# Patient Record
Sex: Female | Born: 1950
Health system: Southern US, Community
[De-identification: ages and names within clinical notes are randomized; demographics above are authoritative.]

## PROBLEM LIST (undated history)

## (undated) DIAGNOSIS — I10 Essential (primary) hypertension: Secondary | ICD-10-CM

## (undated) DIAGNOSIS — K5730 Diverticulosis of large intestine without perforation or abscess without bleeding: Secondary | ICD-10-CM

## (undated) DIAGNOSIS — I4891 Unspecified atrial fibrillation: Secondary | ICD-10-CM

## (undated) DIAGNOSIS — I5022 Chronic systolic (congestive) heart failure: Secondary | ICD-10-CM

## (undated) DIAGNOSIS — Z7901 Long term (current) use of anticoagulants: Secondary | ICD-10-CM

## (undated) DIAGNOSIS — Z8719 Personal history of other diseases of the digestive system: Secondary | ICD-10-CM

## (undated) DIAGNOSIS — I7300 Raynaud's syndrome without gangrene: Secondary | ICD-10-CM

## (undated) DIAGNOSIS — M1990 Unspecified osteoarthritis, unspecified site: Secondary | ICD-10-CM

## (undated) DIAGNOSIS — R609 Edema, unspecified: Secondary | ICD-10-CM

## (undated) DIAGNOSIS — N183 Chronic kidney disease, stage 3 unspecified: Secondary | ICD-10-CM

## (undated) DIAGNOSIS — I2510 Atherosclerotic heart disease of native coronary artery without angina pectoris: Secondary | ICD-10-CM

## (undated) DIAGNOSIS — I428 Other cardiomyopathies: Secondary | ICD-10-CM

## (undated) DIAGNOSIS — E785 Hyperlipidemia, unspecified: Secondary | ICD-10-CM

## (undated) DIAGNOSIS — N189 Chronic kidney disease, unspecified: Secondary | ICD-10-CM

## (undated) DIAGNOSIS — I959 Hypotension, unspecified: Secondary | ICD-10-CM

## (undated) DIAGNOSIS — N179 Acute kidney failure, unspecified: Secondary | ICD-10-CM

## (undated) DIAGNOSIS — E041 Nontoxic single thyroid nodule: Secondary | ICD-10-CM

## (undated) DIAGNOSIS — Z960 Presence of urogenital implants: Secondary | ICD-10-CM

## (undated) DIAGNOSIS — N959 Unspecified menopausal and perimenopausal disorder: Secondary | ICD-10-CM

## (undated) DIAGNOSIS — R600 Localized edema: Secondary | ICD-10-CM

## (undated) DIAGNOSIS — I480 Paroxysmal atrial fibrillation: Secondary | ICD-10-CM

## (undated) DIAGNOSIS — I509 Heart failure, unspecified: Secondary | ICD-10-CM

## (undated) DIAGNOSIS — Z87442 Personal history of urinary calculi: Secondary | ICD-10-CM

## (undated) DIAGNOSIS — I63442 Cerebral infarction due to embolism of left cerebellar artery: Secondary | ICD-10-CM

## (undated) DIAGNOSIS — K5731 Diverticulosis of large intestine without perforation or abscess with bleeding: Secondary | ICD-10-CM

## (undated) DIAGNOSIS — G4710 Hypersomnia, unspecified: Secondary | ICD-10-CM

## (undated) DIAGNOSIS — R300 Dysuria: Secondary | ICD-10-CM

## (undated) DIAGNOSIS — E559 Vitamin D deficiency, unspecified: Secondary | ICD-10-CM

## (undated) DIAGNOSIS — D62 Acute posthemorrhagic anemia: Secondary | ICD-10-CM

## (undated) DIAGNOSIS — J9602 Acute respiratory failure with hypercapnia: Secondary | ICD-10-CM

## (undated) DIAGNOSIS — R0600 Dyspnea, unspecified: Secondary | ICD-10-CM

## (undated) DIAGNOSIS — D689 Coagulation defect, unspecified: Secondary | ICD-10-CM

## (undated) DIAGNOSIS — I639 Cerebral infarction, unspecified: Secondary | ICD-10-CM

## (undated) DIAGNOSIS — R29818 Other symptoms and signs involving the nervous system: Secondary | ICD-10-CM

## (undated) DIAGNOSIS — I482 Chronic atrial fibrillation, unspecified: Secondary | ICD-10-CM

## (undated) DIAGNOSIS — R001 Bradycardia, unspecified: Secondary | ICD-10-CM

## (undated) HISTORY — DX: Paroxysmal atrial fibrillation: I48.0

## (undated) HISTORY — DX: Diverticulosis of large intestine without perforation or abscess with bleeding: K57.31

## (undated) HISTORY — DX: Hypersomnia, unspecified: G47.10

## (undated) HISTORY — DX: Edema, unspecified: R60.9

## (undated) HISTORY — DX: Coagulation defect, unspecified: D68.9

## (undated) HISTORY — PX: CHOLECYSTECTOMY: SHX55

## (undated) HISTORY — DX: Hyperlipidemia, unspecified: E78.5

## (undated) HISTORY — DX: Diverticulosis of large intestine without perforation or abscess without bleeding: K57.30

## (undated) HISTORY — DX: Acute respiratory failure with hypercapnia: J96.02

## (undated) HISTORY — DX: Hypotension, unspecified: I95.9

## (undated) HISTORY — DX: Personal history of other diseases of the digestive system: Z87.19

## (undated) HISTORY — DX: Cerebral infarction due to embolism of left cerebellar artery: I63.442

## (undated) HISTORY — DX: Bradycardia, unspecified: R00.1

## (undated) HISTORY — DX: Nontoxic single thyroid nodule: E04.1

## (undated) HISTORY — DX: Essential (primary) hypertension: I10

## (undated) HISTORY — PX: COLONOSCOPY: SHX174

## (undated) HISTORY — DX: Other symptoms and signs involving the nervous system: R29.818

## (undated) HISTORY — PX: PARTIAL HYSTERECTOMY: SHX80

## (undated) HISTORY — DX: Unspecified menopausal and perimenopausal disorder: N95.9

## (undated) HISTORY — DX: Dysuria: R30.0

## (undated) HISTORY — DX: Acute posthemorrhagic anemia: D62

## (undated) HISTORY — DX: Localized edema: R60.0

## (undated) HISTORY — DX: Vitamin D deficiency, unspecified: E55.9

## (undated) HISTORY — DX: Morbid (severe) obesity due to excess calories: E66.01

## (undated) HISTORY — DX: Chronic systolic (congestive) heart failure: I50.22

## (undated) HISTORY — DX: Acute kidney failure, unspecified: N17.9

## (undated) HISTORY — DX: Long term (current) use of anticoagulants: Z79.01

## (undated) HISTORY — DX: Chronic kidney disease, unspecified: N18.9

## (undated) HISTORY — DX: Unspecified osteoarthritis, unspecified site: M19.90

## (undated) HISTORY — DX: Chronic atrial fibrillation, unspecified: I48.20

## (undated) HISTORY — DX: Raynaud's syndrome without gangrene: I73.00

---

## 1996-01-18 ENCOUNTER — Encounter: Payer: Self-pay | Admitting: Internal Medicine

## 1996-01-18 LAB — CONVERTED CEMR LAB

## 2000-05-22 ENCOUNTER — Encounter (INDEPENDENT_AMBULATORY_CARE_PROVIDER_SITE_OTHER): Payer: Self-pay | Admitting: Specialist

## 2000-05-22 ENCOUNTER — Other Ambulatory Visit: Admission: RE | Admit: 2000-05-22 | Discharge: 2000-05-22 | Payer: Self-pay | Admitting: Gastroenterology

## 2000-05-22 LAB — HM COLONOSCOPY

## 2001-12-01 ENCOUNTER — Other Ambulatory Visit: Admission: RE | Admit: 2001-12-01 | Discharge: 2001-12-01 | Payer: Self-pay | Admitting: Obstetrics and Gynecology

## 2002-12-27 ENCOUNTER — Other Ambulatory Visit: Admission: RE | Admit: 2002-12-27 | Discharge: 2002-12-27 | Payer: Self-pay | Admitting: Obstetrics and Gynecology

## 2004-06-25 ENCOUNTER — Other Ambulatory Visit: Admission: RE | Admit: 2004-06-25 | Discharge: 2004-06-25 | Payer: Self-pay | Admitting: Obstetrics and Gynecology

## 2004-12-24 ENCOUNTER — Ambulatory Visit: Payer: Self-pay | Admitting: Internal Medicine

## 2005-08-06 ENCOUNTER — Other Ambulatory Visit: Admission: RE | Admit: 2005-08-06 | Discharge: 2005-08-06 | Payer: Self-pay | Admitting: Obstetrics and Gynecology

## 2006-01-12 ENCOUNTER — Ambulatory Visit: Payer: Self-pay | Admitting: Internal Medicine

## 2006-08-03 ENCOUNTER — Ambulatory Visit: Payer: Self-pay | Admitting: Internal Medicine

## 2006-08-14 ENCOUNTER — Ambulatory Visit (HOSPITAL_COMMUNITY): Admission: RE | Admit: 2006-08-14 | Discharge: 2006-08-14 | Payer: Self-pay | Admitting: Internal Medicine

## 2007-01-07 ENCOUNTER — Ambulatory Visit: Payer: Self-pay | Admitting: Internal Medicine

## 2007-01-07 LAB — CONVERTED CEMR LAB
ALT: 23 units/L (ref 0–40)
AST: 24 units/L (ref 0–37)
Alkaline Phosphatase: 64 units/L (ref 39–117)
BUN: 14 mg/dL (ref 6–23)
Basophils Relative: 0.5 % (ref 0.0–1.0)
Bilirubin Urine: NEGATIVE
CO2: 34 meq/L — ABNORMAL HIGH (ref 19–32)
Calcium: 9.6 mg/dL (ref 8.4–10.5)
Creatinine, Ser: 0.8 mg/dL (ref 0.4–1.2)
Crystals: NEGATIVE
Eosinophils Absolute: 0.1 10*3/uL (ref 0.0–0.6)
Eosinophils Relative: 1.7 % (ref 0.0–5.0)
HCT: 42.2 % (ref 36.0–46.0)
LDL Cholesterol: 111 mg/dL — ABNORMAL HIGH (ref 0–99)
MCHC: 33.8 g/dL (ref 30.0–36.0)
MCV: 89.3 fL (ref 78.0–100.0)
Neutro Abs: 4.2 10*3/uL (ref 1.4–7.7)
Neutrophils Relative %: 66 % (ref 43.0–77.0)
RBC / HPF: NONE SEEN
RDW: 12.6 % (ref 11.5–14.6)
Sodium: 140 meq/L (ref 135–145)
Specific Gravity, Urine: 1.025 (ref 1.000–1.03)
TSH: 0.76 microintl units/mL (ref 0.35–5.50)
Total Bilirubin: 0.9 mg/dL (ref 0.3–1.2)
Total Protein: 7.7 g/dL (ref 6.0–8.3)
Triglycerides: 88 mg/dL (ref 0–149)
VLDL: 18 mg/dL (ref 0–40)
WBC: 6.3 10*3/uL (ref 4.5–10.5)

## 2007-01-15 ENCOUNTER — Ambulatory Visit: Payer: Self-pay | Admitting: Internal Medicine

## 2007-07-22 ENCOUNTER — Encounter: Payer: Self-pay | Admitting: Internal Medicine

## 2007-07-22 DIAGNOSIS — I7300 Raynaud's syndrome without gangrene: Secondary | ICD-10-CM | POA: Insufficient documentation

## 2007-07-22 DIAGNOSIS — K5731 Diverticulosis of large intestine without perforation or abscess with bleeding: Secondary | ICD-10-CM

## 2007-07-22 DIAGNOSIS — K5730 Diverticulosis of large intestine without perforation or abscess without bleeding: Secondary | ICD-10-CM

## 2007-07-22 DIAGNOSIS — R609 Edema, unspecified: Secondary | ICD-10-CM | POA: Insufficient documentation

## 2007-07-22 DIAGNOSIS — I10 Essential (primary) hypertension: Secondary | ICD-10-CM

## 2007-07-22 HISTORY — DX: Essential (primary) hypertension: I10

## 2007-07-22 HISTORY — DX: Diverticulosis of large intestine without perforation or abscess with bleeding: K57.31

## 2007-07-22 HISTORY — DX: Diverticulosis of large intestine without perforation or abscess without bleeding: K57.30

## 2007-07-22 HISTORY — DX: Morbid (severe) obesity due to excess calories: E66.01

## 2007-07-22 HISTORY — DX: Raynaud's syndrome without gangrene: I73.00

## 2007-07-25 DIAGNOSIS — M545 Low back pain, unspecified: Secondary | ICD-10-CM | POA: Insufficient documentation

## 2007-07-25 DIAGNOSIS — Z8719 Personal history of other diseases of the digestive system: Secondary | ICD-10-CM

## 2007-07-25 HISTORY — DX: Personal history of other diseases of the digestive system: Z87.19

## 2008-02-03 ENCOUNTER — Ambulatory Visit: Payer: Self-pay | Admitting: Internal Medicine

## 2008-02-03 DIAGNOSIS — R252 Cramp and spasm: Secondary | ICD-10-CM | POA: Insufficient documentation

## 2008-02-03 DIAGNOSIS — E785 Hyperlipidemia, unspecified: Secondary | ICD-10-CM

## 2008-02-03 HISTORY — DX: Hyperlipidemia, unspecified: E78.5

## 2008-02-05 LAB — CONVERTED CEMR LAB
AST: 15 units/L (ref 0–37)
Albumin: 3.7 g/dL (ref 3.5–5.2)
Alkaline Phosphatase: 69 units/L (ref 39–117)
Basophils Relative: 1.1 % — ABNORMAL HIGH (ref 0.0–1.0)
Calcium: 9.3 mg/dL (ref 8.4–10.5)
Eosinophils Absolute: 0.2 10*3/uL (ref 0.0–0.6)
GFR calc Af Amer: 96 mL/min
Glucose, Bld: 95 mg/dL (ref 70–99)
HCT: 40.4 % (ref 36.0–46.0)
Hemoglobin: 13.3 g/dL (ref 12.0–15.0)
Ketones, ur: NEGATIVE mg/dL
MCHC: 32.9 g/dL (ref 30.0–36.0)
MCV: 91.2 fL (ref 78.0–100.0)
Monocytes Absolute: 0.5 10*3/uL (ref 0.2–0.7)
Monocytes Relative: 7.4 % (ref 3.0–11.0)
Neutrophils Relative %: 58.6 % (ref 43.0–77.0)
Nitrite: NEGATIVE
Potassium: 4.2 meq/L (ref 3.5–5.1)
RDW: 12.5 % (ref 11.5–14.6)
Sodium: 140 meq/L (ref 135–145)
TSH: 0.71 microintl units/mL (ref 0.35–5.50)
Total Protein, Urine: NEGATIVE mg/dL
Total Protein: 7.4 g/dL (ref 6.0–8.3)
Urobilinogen, UA: 0.2 (ref 0.0–1.0)
WBC: 6.3 10*3/uL (ref 4.5–10.5)

## 2008-05-04 ENCOUNTER — Ambulatory Visit: Payer: Self-pay | Admitting: Internal Medicine

## 2008-05-04 DIAGNOSIS — R05 Cough: Secondary | ICD-10-CM

## 2008-05-04 DIAGNOSIS — R059 Cough, unspecified: Secondary | ICD-10-CM | POA: Insufficient documentation

## 2009-02-02 ENCOUNTER — Ambulatory Visit: Payer: Self-pay | Admitting: Internal Medicine

## 2009-02-02 DIAGNOSIS — J069 Acute upper respiratory infection, unspecified: Secondary | ICD-10-CM | POA: Insufficient documentation

## 2009-02-05 LAB — CONVERTED CEMR LAB
Albumin: 3.6 g/dL (ref 3.5–5.2)
Basophils Absolute: 0.1 10*3/uL (ref 0.0–0.1)
Basophils Relative: 0.8 % (ref 0.0–3.0)
Bilirubin, Direct: 0.1 mg/dL (ref 0.0–0.3)
CO2: 36 meq/L — ABNORMAL HIGH (ref 19–32)
Calcium: 9.9 mg/dL (ref 8.4–10.5)
Chloride: 100 meq/L (ref 96–112)
Creatinine, Ser: 0.8 mg/dL (ref 0.4–1.2)
Direct LDL: 138.4 mg/dL
Eosinophils Relative: 1 % (ref 0.0–5.0)
GFR calc Af Amer: 96 mL/min
Glucose, Bld: 89 mg/dL (ref 70–99)
Hemoglobin: 13.5 g/dL (ref 12.0–15.0)
MCHC: 34.2 g/dL (ref 30.0–36.0)
MCV: 89.6 fL (ref 78.0–100.0)
Monocytes Absolute: 0.3 10*3/uL (ref 0.1–1.0)
Platelets: 202 10*3/uL (ref 150–400)
Potassium: 3.1 meq/L — ABNORMAL LOW (ref 3.5–5.1)
RBC: 4.4 M/uL (ref 3.87–5.11)
RDW: 12 % (ref 11.5–14.6)
Sodium: 142 meq/L (ref 135–145)
Specific Gravity, Urine: 1.02 (ref 1.000–1.035)
TSH: 0.76 microintl units/mL (ref 0.35–5.50)
Total CHOL/HDL Ratio: 3.8
Total Protein, Urine: NEGATIVE mg/dL
Triglycerides: 74 mg/dL (ref 0–149)
Urine Glucose: NEGATIVE mg/dL
Urobilinogen, UA: 0.2 (ref 0.0–1.0)
VLDL: 15 mg/dL (ref 0–40)
WBC: 6.9 10*3/uL (ref 4.5–10.5)
pH: 6 (ref 5.0–8.0)

## 2010-01-04 ENCOUNTER — Ambulatory Visit: Payer: Self-pay | Admitting: Internal Medicine

## 2010-01-04 DIAGNOSIS — E041 Nontoxic single thyroid nodule: Secondary | ICD-10-CM | POA: Insufficient documentation

## 2010-01-04 HISTORY — DX: Nontoxic single thyroid nodule: E04.1

## 2010-01-04 LAB — CONVERTED CEMR LAB
ALT: 16 units/L (ref 0–35)
Albumin: 4 g/dL (ref 3.5–5.2)
Alkaline Phosphatase: 54 units/L (ref 39–117)
Basophils Relative: 0.4 % (ref 0.0–3.0)
Calcium: 9.2 mg/dL (ref 8.4–10.5)
Chloride: 100 meq/L (ref 96–112)
Cholesterol: 204 mg/dL — ABNORMAL HIGH (ref 0–200)
Creatinine, Ser: 0.6 mg/dL (ref 0.4–1.2)
Direct LDL: 124.5 mg/dL
HDL: 73.8 mg/dL (ref >39.00)
Hemoglobin, Urine: NEGATIVE
Hemoglobin: 13.1 g/dL (ref 12.0–15.0)
Leukocytes, UA: NEGATIVE
Lymphocytes Relative: 26.6 % (ref 12.0–46.0)
Lymphs Abs: 1.6 10*3/uL (ref 0.7–4.0)
MCHC: 32.9 g/dL (ref 30.0–36.0)
Monocytes Absolute: 0.3 10*3/uL (ref 0.1–1.0)
Monocytes Relative: 5.3 % (ref 3.0–12.0)
Nitrite: NEGATIVE
RBC: 4.27 M/uL (ref 3.87–5.11)
Sodium: 139 meq/L (ref 135–145)
TSH: 0.57 microintl units/mL (ref 0.35–5.50)
Total CHOL/HDL Ratio: 3
Total Protein: 7.5 g/dL (ref 6.0–8.3)
Triglycerides: 46 mg/dL (ref 0.0–149.0)
WBC: 5.9 10*3/uL (ref 4.5–10.5)

## 2010-01-05 LAB — CONVERTED CEMR LAB: Vit D, 25-Hydroxy: 10 ng/mL — ABNORMAL LOW (ref 30–89)

## 2010-01-11 ENCOUNTER — Encounter: Admission: RE | Admit: 2010-01-11 | Discharge: 2010-01-11 | Payer: Self-pay | Admitting: Internal Medicine

## 2010-01-11 ENCOUNTER — Encounter: Payer: Self-pay | Admitting: Internal Medicine

## 2010-01-15 ENCOUNTER — Telehealth (INDEPENDENT_AMBULATORY_CARE_PROVIDER_SITE_OTHER): Payer: Self-pay | Admitting: *Deleted

## 2010-01-17 ENCOUNTER — Ambulatory Visit: Payer: Self-pay | Admitting: Endocrinology

## 2010-01-30 ENCOUNTER — Other Ambulatory Visit: Admission: RE | Admit: 2010-01-30 | Discharge: 2010-01-30 | Payer: Self-pay | Admitting: Diagnostic Radiology

## 2010-01-30 ENCOUNTER — Encounter: Payer: Self-pay | Admitting: Endocrinology

## 2010-01-30 ENCOUNTER — Encounter: Admission: RE | Admit: 2010-01-30 | Discharge: 2010-01-30 | Payer: Self-pay | Admitting: Endocrinology

## 2010-05-02 ENCOUNTER — Encounter (INDEPENDENT_AMBULATORY_CARE_PROVIDER_SITE_OTHER): Payer: Self-pay | Admitting: *Deleted

## 2010-10-24 LAB — HM MAMMOGRAPHY: HM Mammogram: NORMAL

## 2010-12-24 NOTE — Miscellaneous (Signed)
Summary: Orders Update  Clinical Lists Changes  Orders: Added new Referral order of Endocrinology Referral (Endocrine) - Signed

## 2010-12-24 NOTE — Assessment & Plan Note (Signed)
Summary: NEW ENDO CON/THYROID NODULE/ NWS  #   Vital Signs:  Patient profile:   60 year old female Height:      64 inches (162.56 cm) Weight:      239.38 pounds (108.81 kg) O2 Sat:      95 % on Room air Temp:     98.2 degrees F (36.78 degrees C) oral Pulse rate:   71 / minute BP sitting:   138 / 84  (left arm) Cuff size:   large  Vitals Entered By: Gardenia Phlegm RMA (January 17, 2010 2:06 PM)  O2 Flow:  Room air CC: New Endo: Thyroid Nodule/ CF   CC:  New Endo: Thyroid Nodule/ CF.  History of Present Illness: pt was noted in 2007 to have a fullnes at the right anterior neck.  ultrasound raises ? of right lower pole nodule.  symptomatically, pt states few years of slight fullness at the left anterior neck, but no associated pain.   Current Medications (verified): 1)  Triamterene-Hctz 75-50 Mg Tabs (Triamterene-Hctz) .Marland Kitchen.. 1 By Mouth Once Daily 2)  Ecotrin Low Strength 81 Mg  Tbec (Aspirin) .Marland Kitchen.. 1po Qd 3)  Klor-Con 10 10 Meq Cr-Tabs (Potassium Chloride) .Marland Kitchen.. 1po Once Daily  Allergies (verified): No Known Drug Allergies  Past History:  Past Medical History: Last updated: 02/03/2008 Hypertension right thyroid nodule Hyperlipidemia Raynaud's morbid obesity Low back pain - recurrent chronic peripheral edema hypersmonia - declines w/u Diverticulosis, colon  Family History: Reviewed history from 02/03/2008 and no changes required. HTN DM Dialysis mother with asthma no goiter or other thyroid dz  Social History: Reviewed history from 02/03/2008 and no changes required. 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   Physical Exam  General:  morbidly obese.   Head:  head: no deformity eyes: no periorbital swelling, no  proptosis external nose and ears are normal mouth: no lesion seen Neck:  thyroid normal, except for slight right lobe fullness Lungs:  Clear to auscultation bilaterally. Normal respiratory effort.  Heart:  Regular rate and rhythm without murmurs or gallops noted. Normal S1,S2.   Msk:  muscle bulk and strength are grossly normal.  no obvious joint swelling.  gait is normal and steady  Extremities:  no deformity no edema Neurologic:  cn 2-12 grossly intact.   readily moves all 4's.   sensation is intact to touch on all 4's Skin:  normal texture and temp.  no rash.  not diaphoretic  Cervical Nodes:  No significant adenopathy.  Psych:  Alert and cooperative; normal mood and affect; normal attention span and concentration.   Additional Exam:  THYROID ULTRASOUND  Questionable 2 cm nodule in the lower pole of the right thyroid lobe.  This could represent a pseudo lesion.  In addition, there has been interval enlargement of a nodule in the mid right thyroid lobe which now measures up to 1 cm.  Recommend percutaneous fine needle aspiration of these two nodules.  FastTSH                   0.57 uIU/mL      Impression & Recommendations:  Problem # 1:  THYROID NODULE, RIGHT (ICD-241.0) Assessment New uncertain etiology  Problem # 2:  MORBID OBESITY (ICD-278.01) not thyroid-related  Other Orders: Radiology Referral (Radiology) Consultation Level III (  99243)  Patient Instructions: 1)  ultrasound-guided biopsies of the 2 right thyroid nodules.  you will be called with a day and time for an appointment. 2)  given your nodular thyroid disease ("lumpy thyroid"), you should expect the slow development of hyperthyroidism (overactive thyroid). 3)  tests are being ordered for you today.  a few days after the test(s), please call [phone] to hear your test results. 4)  return 1 year.

## 2010-12-24 NOTE — Progress Notes (Signed)
----   Converted from flag ---- ---- 01/14/2010 11:20 AM, Glena Norfolk wrote: PT HAS AN APPT ON FEB 24.  PT IS AWARE.  ---- 01/14/2010 7:57 AM, Ophelia Charter wrote: Please schedule eith Dr Loanne Drilling.  Thanks  ---- 01/11/2010 5:41 PM, Biagio Borg MD wrote: The following orders have been entered for this patient and placed on Admin Hold:  Type:     Referral       Code:   Endocrine Description:   Endocrinology Referral Order Date:   01/11/2010   Authorized By:   Biagio Borg MD Order #:   [id number] Clinical Notes:   dr Loanne Drilling ------------------------------

## 2010-12-24 NOTE — Assessment & Plan Note (Signed)
Summary: FU--D/T---STC   Vital Signs:  Patient profile:   60 year old female Height:      64 inches Weight:      241.38 pounds BMI:     41.58 O2 Sat:      98 % on Room air Temp:     99.4 degrees F oral Pulse rate:   69 / minute BP sitting:   134 / 90  (left arm) Cuff size:   large  Vitals Entered ByShirlean Mylar Ewing (January 04, 2010 1:35 PM)  O2 Flow:  Room air  CC: followup/RE   CC:  followup/RE.  History of Present Illness: overall doing well, no complaints,  Pt denies CP, sob, doe, wheezing, orthopnea, pnd, worsening LE edema, palps, dizziness or syncope  Pt denies new neuro symptoms such as headache, facial or extremity weakness   Problems Prior to Update: 1)  Thyroid Nodule, Right  (ICD-241.0) 2)  Uri  (ICD-465.9) 3)  Preventive Health Care  (ICD-V70.0) 4)  Cough  (ICD-786.2) 5)  Hyperlipidemia  (ICD-272.4) 6)  Leg Cramps, Nocturnal  (ICD-729.82) 7)  Preventive Health Care  (ICD-V70.0) 8)  Raynaud's Syndrome  (ICD-443.0) 9)  Symptom, Edema  (ICD-782.3) 10)  Low Back Pain  (ICD-724.2) 11)  Diverticulitis, Hx of  (ICD-V12.79) 12)  Morbid Obesity  (ICD-278.01) 13)  Hypertension  (ICD-401.9) 14)  Diverticulosis, Colon  (ICD-562.10)  Medications Prior to Update: 1)  Triamterene-Hctz 75-50 Mg Tabs (Triamterene-Hctz) .Marland Kitchen.. 1 By Mouth Once Daily 2)  Flexeril 5 Mg  Tabs (Cyclobenzaprine Hcl) .Marland Kitchen.. 1po Three Times A Day As Needed 3)  Ecotrin Low Strength 81 Mg  Tbec (Aspirin) .Marland Kitchen.. 1po Qd 4)  Promethazine-Codeine 6.25-10 Mg/53ml  Syrp (Promethazine-Codeine) .Marland Kitchen.. 1 Tsp By Mouth Q 6 Hrs As Needed Cough 5)  Klor-Con 10 10 Meq Cr-Tabs (Potassium Chloride) .Marland Kitchen.. 1po Once Daily  Current Medications (verified): 1)  Triamterene-Hctz 75-50 Mg Tabs (Triamterene-Hctz) .Marland Kitchen.. 1 By Mouth Once Daily 2)  Ecotrin Low Strength 81 Mg  Tbec (Aspirin) .Marland Kitchen.. 1po Qd 3)  Klor-Con 10 10 Meq Cr-Tabs (Potassium Chloride) .Marland Kitchen.. 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         all otherwise negative per pt -   Physical Exam  General:  alert and overweight-appearing.   Head:  normocephalic and atraumatic.   Eyes:  vision grossly intact, pupils equal, and pupils round.   Ears:  R ear normal and L ear normal.   Nose:  no external erythema and no nasal discharge.   Mouth:  no gingival abnormalities and pharynx pink and moist.   Neck:  supple and no masses.  except right thyroid nodule ? 1 cm, nontender but not hard or fixed  Lungs:  normal respiratory effort and normal breath sounds.   Heart:  normal rate and regular rhythm.   Abdomen:  soft, non-tender, and normal bowel sounds.   Msk:  no joint tenderness and no joint swelling.   Extremities:  no edema, no erythema  Neurologic:  cranial nerves II-XII intact and strength normal in all extremities.     Impression & Recommendations:  Problem # 1:  Preventive Health Care  (ICD-V70.0)  Overall doing well, age appropriate education and counseling updated and referral for appropriate preventive services done unless declined, immunizations up to date or declined, diet counseling done if overweight, urged to quit smoking if smokes , most recent labs reviewed and current ordered if appropriate, ecg reviewed or declined (interpretation per ECG scanned in the EMR if done); information regarding Medicare Prevention requirements given if appropriate   Orders: T-Vitamin D (25-Hydroxy) [id number]) TLB-BMP (Basic Metabolic Panel-BMET) (99991111) TLB-CBC Platelet - w/Differential (85025-CBCD) TLB-Hepatic/Liver Function Pnl (80076-HEPATIC) TLB-Lipid Panel (80061-LIPID) TLB-TSH (Thyroid Stimulating Hormone) (84443-TSH) TLB-Udip ONLY (81003-UDIP)  Problem # 2:  THYROID NODULE, RIGHT (ICD-241.0)  for thyroid u/s  Orders: Radiology Referral (Radiology)  Problem # 3:  HYPERTENSION (ICD-401.9)  Her updated medication list for this problem includes:    Triamterene-hctz 75-50 Mg Tabs (Triamterene-hctz) .Marland Kitchen... 1 by mouth once daily treat as above, f/u any worsening signs or symptoms   Complete Medication List: 1)  Triamterene-hctz 75-50 Mg Tabs (Triamterene-hctz) .Marland Kitchen.. 1 by mouth once daily 2)  Ecotrin Low Strength 81 Mg Tbec (Aspirin) .Marland Kitchen.. 1po qd 3)  Klor-con 10 10 Meq Cr-tabs (Potassium chloride) .Marland Kitchen.. 1po once daily  Patient Instructions: 1)  you had the flu shot today 2)  Continue all previous medications as before this visit  3)  Please go to the Lab in the basement for your blood and/or urine tests today  4)  You will be contacted about the referral(s) to: thyroid ultrasound 5)  Please schedule a follow-up appointment in 1 year or sooner if needed Prescriptions: KLOR-CON 10 10 MEQ CR-TABS (POTASSIUM CHLORIDE) 1po once daily  #90 x 3   Entered and Authorized by:   Biagio Borg MD   Signed by:   Biagio Borg MD on 01/04/2010   Method used:   Electronically to         [facility] (retail)       [place], [place]  [address]       Ph: [phone]       Fax: [phone]   [id number] TRIAMTERENE-HCTZ 75-50 MG TABS (TRIAMTERENE-HCTZ) 1 by mouth once daily  #90 x 3   Entered and Authorized by:   Biagio Borg MD   Signed by:   Biagio Borg MD on 01/04/2010   Method used:   Electronically to        [facility] (retail)       [place], [place]  [address]       Ph: [phone]       Fax: [phone]   [id number] KLOR-CON 10 10 MEQ CR-TABS (POTASSIUM CHLORIDE) 1po once daily  #90 x 3   Entered and Authorized by:   Biagio Borg MD   Signed by:   Biagio Borg MD  on 01/04/2010   Method used:   Print then Give to Patient   RxID:   [id number] TRIAMTERENE-HCTZ 75-50 MG TABS (TRIAMTERENE-HCTZ) 1 by mouth once daily  #90 x 3   Entered and Authorized by:   Biagio Borg MD   Signed by:   Biagio Borg MD on 01/04/2010   Method used:   Print then Give to Patient   RxID:   [id number] TRIAMTERENE-HCTZ 75-50 MG TABS (TRIAMTERENE-HCTZ) 1 by mouth once daily  #90 x 3   Entered and Authorized by:   Biagio Borg MD   Signed by:   Biagio Borg MD on 01/04/2010   Method used:   Print then Give to Patient   RxID:   [id number]

## 2010-12-24 NOTE — Letter (Signed)
Summary: Colonoscopy Letter  [facility]  [place], [place] [address]   Phone: [phone]  Fax: [phone]      May 02, 2010 MRN: [id number]   [facility] [address] [place], [place]  [address]   Dear Ms. Keahey,   According to your medical record, it is time for you to schedule a Colonoscopy. The American Cancer Society recommends this procedure as a method to detect early colon cancer. Patients with a family history of colon cancer, or a personal history of colon polyps or inflammatory bowel disease are at increased risk.  This letter has beeen generated based on the recommendations made at the time of your procedure. If you feel that in your particular situation this may no longer apply, please contact our office.  Please call our office at [phone] to schedule this appointment or to update your records at your earliest convenience.  Thank you for cooperating with [place] to provide you with the very best care possible.   Sincerely,  Norberto Sorenson T. Fuller Plan, M.D.  [facility] [phone]

## 2011-01-09 ENCOUNTER — Other Ambulatory Visit: Payer: PRIVATE HEALTH INSURANCE

## 2011-01-09 ENCOUNTER — Ambulatory Visit (INDEPENDENT_AMBULATORY_CARE_PROVIDER_SITE_OTHER): Payer: PRIVATE HEALTH INSURANCE | Admitting: Internal Medicine

## 2011-01-09 ENCOUNTER — Other Ambulatory Visit: Payer: Self-pay | Admitting: Internal Medicine

## 2011-01-09 ENCOUNTER — Encounter: Payer: Self-pay | Admitting: Internal Medicine

## 2011-01-09 ENCOUNTER — Encounter (INDEPENDENT_AMBULATORY_CARE_PROVIDER_SITE_OTHER): Payer: Self-pay | Admitting: *Deleted

## 2011-01-09 DIAGNOSIS — Z23 Encounter for immunization: Secondary | ICD-10-CM

## 2011-01-09 DIAGNOSIS — N959 Unspecified menopausal and perimenopausal disorder: Secondary | ICD-10-CM | POA: Insufficient documentation

## 2011-01-09 DIAGNOSIS — E559 Vitamin D deficiency, unspecified: Secondary | ICD-10-CM

## 2011-01-09 DIAGNOSIS — E785 Hyperlipidemia, unspecified: Secondary | ICD-10-CM

## 2011-01-09 DIAGNOSIS — Z0000 Encounter for general adult medical examination without abnormal findings: Secondary | ICD-10-CM

## 2011-01-09 HISTORY — DX: Unspecified menopausal and perimenopausal disorder: N95.9

## 2011-01-09 HISTORY — DX: Vitamin D deficiency, unspecified: E55.9

## 2011-01-09 LAB — BASIC METABOLIC PANEL
BUN: 17 mg/dL (ref 6–23)
Chloride: 100 mEq/L (ref 96–112)
Creatinine, Ser: 0.7 mg/dL (ref 0.4–1.2)
Sodium: 138 mEq/L (ref 135–145)

## 2011-01-09 LAB — LIPID PANEL
Triglycerides: 69 mg/dL (ref 0.0–149.0)
VLDL: 13.8 mg/dL (ref 0.0–40.0)

## 2011-01-09 LAB — URINALYSIS, ROUTINE W REFLEX MICROSCOPIC
Hgb urine dipstick: NEGATIVE
Leukocytes, UA: NEGATIVE
Urine Glucose: NEGATIVE
Urobilinogen, UA: 0.2 (ref 0.0–1.0)
pH: 6.5 (ref 5.0–8.0)

## 2011-01-09 LAB — CBC WITH DIFFERENTIAL/PLATELET
Basophils Absolute: 0 10*3/uL (ref 0.0–0.1)
Basophils Relative: 0.4 % (ref 0.0–3.0)
HCT: 41.6 % (ref 36.0–46.0)
Hemoglobin: 14.1 g/dL (ref 12.0–15.0)
Lymphocytes Relative: 19.8 % (ref 12.0–46.0)
Lymphs Abs: 1.4 10*3/uL (ref 0.7–4.0)
MCV: 91.7 fl (ref 78.0–100.0)
Monocytes Absolute: 0.3 10*3/uL (ref 0.1–1.0)
RBC: 4.54 Mil/uL (ref 3.87–5.11)
WBC: 7.3 10*3/uL (ref 4.5–10.5)

## 2011-01-09 LAB — LDL CHOLESTEROL, DIRECT: Direct LDL: 133.4 mg/dL

## 2011-01-09 LAB — HEPATIC FUNCTION PANEL: ALT: 14 U/L (ref 0–35)

## 2011-01-15 NOTE — Assessment & Plan Note (Signed)
Summary: PER PT D/T---FU   STC   Vital Signs:  Patient profile:   60 year old female Height:      65 inches Weight:      235 pounds BMI:     39.25 Temp:     98.9 degrees F oral Pulse rate:   70 / minute BP sitting:   140 / 82  (left arm) Cuff size:   large  Vitals Entered By: Shirlean Mylar Ewing CMA ([place]) (January 09, 2011 1:32 PM)  O2 Flow:  Room air  Preventive Care Screening  Mammogram:    Date:  10/24/2010    Results:  normal   CC: followup/RE   CC:  followup/RE.  History of Present Illness: here for wellness, overall doing ok;  Pt denies CP, worsening sob, doe, wheezing, orthopnea, pnd, worsening LE edema, palps, dizziness or syncope  Pt denies new neuro symptoms such as headache, facial or extremity weakness  Pt denies polydipsia, polyuria   Overall good compliance with meds, trying to follow low chol  diet, wt stable, little excercise however .  Overall good compliance with meds, and good tolerability.  No fever, wt loss, night sweats, loss of appetite or other constitutional symptoms  Denies worsening depressive symptoms, suicidal ideation, or panic.   Pt states good ability with ADL's, low fall risk, home safety reviewed and adequate, no significant change in hearing or vision, trying to follow lower chol diet, and occasionally active only with regular excercise.  No new complaints  Preventive Screening-Counseling & Management      Drug Use:  no.    Problems Prior to Update: 1)  Menopausal Disorder  (ICD-627.9) 2)  Vitamin D Deficiency  (ICD-268.9) 3)  Thyroid Nodule, Right  (ICD-241.0) 4)  Uri  (ICD-465.9) 5)  Preventive Health Care  (ICD-V70.0) 6)  Cough  (ICD-786.2) 7)  Hyperlipidemia  (ICD-272.4) 8)  Leg Cramps, Nocturnal  (ICD-729.82) 9)  Preventive Health Care  (ICD-V70.0) 10)  Raynaud's Syndrome  (ICD-443.0) 11)  Symptom, Edema  (ICD-782.3) 12)  Low Back Pain  (ICD-724.2) 13)  Diverticulitis, Hx of  (ICD-V12.79) 14)  Morbid Obesity  (ICD-278.01) 15)   Hypertension  (ICD-401.9) 16)  Diverticulosis, Colon  (ICD-562.10)  Medications Prior to Update: 1)  Triamterene-Hctz 75-50 Mg Tabs (Triamterene-Hctz) .Marland Kitchen.. 1 By Mouth Once Daily 2)  Ecotrin Low Strength 81 Mg  Tbec (Aspirin) .Marland Kitchen.. 1po Qd 3)  Klor-Con 10 10 Meq Cr-Tabs (Potassium Chloride) .Marland Kitchen.. 1po Once Daily  Current Medications (verified): 1)  Triamterene-Hctz 75-50 Mg Tabs (Triamterene-Hctz) .Marland Kitchen.. 1 By Mouth Once Daily 2)  Ecotrin Low Strength 81 Mg  Tbec (Aspirin) .Marland Kitchen.. 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 Drug use-no  Risk Factors: Smoking Status: current (02/03/2008)  Past Medical History: Hypertension right thyroid nodule Hyperlipidemia Raynaud's morbid obesity Low back pain - recurrent chronic peripheral edema hypersmonia - declines w/u Diverticulosis, colon vit d deficiency  Social History: Current Smoker 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         all otherwise negative per pt -    Physical Exam  General:  alert and overweight-appearing.   Head:  normocephalic and atraumatic.   Eyes:  vision grossly intact, pupils equal, and pupils round.   Ears:  R ear  normal and L ear normal.   Nose:  no external erythema and no nasal discharge.   Mouth:  no gingival abnormalities and pharynx pink and moist.   Neck:  supple and full ROM.   Lungs:  normal respiratory effort and normal breath sounds.   Heart:  normal rate and regular rhythm.   Abdomen:  soft, non-tender, and normal bowel sounds.   Msk:  no joint tenderness and no joint swelling.   Extremities:  no edema, no erythema  Neurologic:  cranial nerves II-XII intact and strength normal in all extremities.   Skin:  color normal and no rashes.   Psych:  not anxious appearing and not depressed appearing.     Impression & Recommendations:  Problem # 1:  Preventive Health Care (ICD-V70.0) Overall doing well, age appropriate education and counseling updated, referral for preventive services and immunizations addressed, dietary counseling and smoking status adressed , most recent labs reviewed, ecg reviewed I have personally reviewed and have noted 1.The patient's medical and social history 2.Their use of alcohol, tobacco or illicit drugs 3.Their current medications and supplements 4. Functional ability including ADL's, fall risk, home safety risk, hearing & visual impairment  5.Diet and physical activities 6.Evidence for depression or mood disorders The patients weight, height, BMI  have been recorded in the chart I have made referrals, counseling and provided education to the patient based review of the above  Orders: Gastroenterology Referral (GI) TLB-BMP (Basic Metabolic Panel-BMET) (99991111) TLB-CBC Platelet - w/Differential (85025-CBCD) TLB-Hepatic/Liver Function Pnl (80076-HEPATIC) TLB-Lipid Panel (80061-LIPID) TLB-TSH (Thyroid Stimulating Hormone) (84443-TSH) TLB-Udip ONLY (81003-UDIP)  Problem # 2:  VITAMIN D DEFICIENCY (ICD-268.9) to start vit d 1000 units per day  Problem # 3:  HYPERTENSION (ICD-401.9)  Her updated medication list for this problem includes:     Triamterene-hctz 75-50 Mg Tabs (Triamterene-hctz) .Marland Kitchen... 1 by mouth once daily  BP today: 140/82 Prior BP: 138/84 (01/17/2010)  Labs Reviewed: K+: 3.2 (01/04/2010) Creat: : 0.6 (01/04/2010)   Chol: 204 (01/04/2010)   HDL: 73.80 (01/04/2010)   LDL: DEL (02/02/2009)   TG: 46.0 (01/04/2010) stable overall by hx and exam, ok to continue meds/tx as is   Complete Medication List: 1)  Triamterene-hctz 75-50 Mg Tabs (Triamterene-hctz) .Marland Kitchen.. 1 by mouth once daily 2)  Ecotrin Low Strength 81 Mg Tbec (Aspirin) .Marland Kitchen.. 1po qd  Other Orders: T-Bone Densitometry [id number]) T-Lumbar Vertebral Assessment [id number]) Flu Vaccine 31yrs + [id number]) Admin 1st Vaccine [id number])  Patient Instructions: 1)  You will be contacted about the referral(s) to: colonoscopy 2)  please start Vit D 1000 units per day 3)  Please go to the Lab in the basement for your blood and/or urine tests today 4)  Please call the number on the [facility] for results of your testing  5)  please schedule the bone density test before leaving today, on a Friday 6)  Continue all previous medications as before this visit  7)  You received the flu shot today  8)  Please schedule a follow-up appointment in 1 year or sooner if needed Prescriptions: TRIAMTERENE-HCTZ 75-50 MG TABS (TRIAMTERENE-HCTZ) 1 by mouth once  daily  #90 x 3   Entered and Authorized by:   Biagio Borg MD   Signed by:   Biagio Borg MD on 01/09/2011   Method used:   Print then Give to Patient   RxID:   [id number]    Orders Added: 1)  T-Bone Densitometry [id number] 2)  T-Lumbar Vertebral Assessment [77082] 3)  Flu Vaccine 48yrs + [id number] 4)  Admin 1st Vaccine [id number])  Gastroenterology Referral [GI] 6)  TLB-BMP (Basic Metabolic Panel-BMET) 123456 7)  TLB-CBC Platelet - w/Differential [85025-CBCD] 8)  TLB-Hepatic/Liver Function Pnl [80076-HEPATIC] 9)  TLB-Lipid Panel [80061-LIPID] 10)  TLB-TSH (Thyroid Stimulating Hormone) [84443-TSH] 11)  TLB-Udip ONLY  [81003-UDIP] 12)  Est. Patient 40-64 years [99396]   Immunizations Administered:  Influenza Vaccine # 1:    Vaccine Type: Fluvax 3+    Site: left deltoid    Mfr: Sanofi Pasteur    Dose: 0.5 ml    Route: IM    Given by: Shirlean Mylar Ewing CMA (AAMA)    Exp. Date: 05/24/2011    Lot #: [id number]    VIS given: 06/18/10 version given January 09, 2011.  Flu Vaccine Consent Questions:    Do you have a history of severe allergic reactions to this vaccine? no    Any prior history of allergic reactions to egg and/or gelatin? no    Do you have a sensitivity to the preservative Thimersol? no    Do you have a past history of Guillan-Barre Syndrome? no    Do you currently have an acute febrile illness? no    Have you ever had a severe reaction to latex? no    Vaccine information given and explained to patient? yes    Are you currently pregnant? no   Immunizations Administered:  Influenza Vaccine # 1:    Vaccine Type: Fluvax 3+    Site: left deltoid    Mfr: Sanofi Pasteur    Dose: 0.5 ml    Route: IM    Given by: Shirlean Mylar Ewing CMA (AAMA)    Exp. Date: 05/24/2011    Lot #: [id number]    VIS given: 06/18/10 version given January 09, 2011.

## 2011-01-22 ENCOUNTER — Encounter (INDEPENDENT_AMBULATORY_CARE_PROVIDER_SITE_OTHER): Payer: Self-pay | Admitting: *Deleted

## 2011-01-30 ENCOUNTER — Other Ambulatory Visit: Payer: Self-pay | Admitting: Internal Medicine

## 2011-01-30 DIAGNOSIS — N959 Unspecified menopausal and perimenopausal disorder: Secondary | ICD-10-CM

## 2011-01-30 NOTE — Letter (Signed)
Summary: Pre Visit Letter Revised  [facility]  [place], [place] [address]   Phone: [phone]  Fax: [phone]        01/22/2011 MRN: [id number] Patricia Wagner [address] [place], [place]  [address]  [place]             Procedure Date:  03/21/2011 @ 9:00   Recall colon-Dr. Fuller Plan   Welcome to the Gastroenterology Division at [facility].    You are scheduled to see a nurse for your pre-procedure visit on 03/07/2011 at 2:30 on the 3rd floor at [facility], [place] [employer].  We ask that you try to arrive at our office 15 minutes prior to your appointment time to allow for check-in.  Please take a minute to review the attached form.  If you answer "Yes" to one or more of the questions on the first page, we ask that you call the person listed at your earliest opportunity.  If you answer "No" to all of the questions, please complete the rest of the form and bring it to your appointment.    Your nurse visit will consist of discussing your medical and surgical history, your immediate family medical history, and your medications.   If you are unable to list all of your medications on the form, please bring the medication bottles to your appointment and we will list them.  We will need to be aware of both prescribed and over the counter drugs.  We will need to know exact dosage information as well.    Please be prepared to read and sign documents such as consent forms, a financial agreement, and acknowledgement forms.  If necessary, and with your consent, a friend or relative is welcome to sit-in on the nurse visit with you.  Please bring your insurance card so that we may make a copy of it.  If your insurance requires a referral to see a specialist, please bring your referral form from your primary care physician.  No co-pay is required for this nurse visit.     If you cannot keep your appointment, please call [phone] to cancel or reschedule prior to  your appointment date.  This allows [place] the opportunity to schedule an appointment for another patient in need of care.    Thank you for choosing [facility] for your medical needs.  We appreciate the opportunity to care for you.  Please visit [place] at our website  to learn more about our practice.  Sincerely, The Gastroenterology Division

## 2011-01-31 ENCOUNTER — Ambulatory Visit
Admission: RE | Admit: 2011-01-31 | Discharge: 2011-01-31 | Disposition: A | Discharge status: Home / Self Care | Payer: PRIVATE HEALTH INSURANCE | Source: Ambulatory Visit | Attending: Internal Medicine | Admitting: Internal Medicine

## 2011-01-31 ENCOUNTER — Inpatient Hospital Stay: Admission: RE | Admit: 2011-01-31 | Payer: PRIVATE HEALTH INSURANCE | Source: Ambulatory Visit

## 2011-01-31 ENCOUNTER — Ambulatory Visit (INDEPENDENT_AMBULATORY_CARE_PROVIDER_SITE_OTHER)
Admission: RE | Admit: 2011-01-31 | Discharge: 2011-01-31 | Disposition: A | Discharge status: Home / Self Care | Payer: PRIVATE HEALTH INSURANCE | Source: Ambulatory Visit | Attending: Internal Medicine | Admitting: Internal Medicine

## 2011-01-31 DIAGNOSIS — N959 Unspecified menopausal and perimenopausal disorder: Secondary | ICD-10-CM

## 2011-02-19 ENCOUNTER — Telehealth: Payer: Self-pay | Admitting: Internal Medicine

## 2011-02-20 NOTE — Telephone Encounter (Signed)
Per pharmacist the generic manufacture's medication was on back order but it was received this morning. Pharmacist will prepare medication and pt was informed.

## 2011-02-20 NOTE — Telephone Encounter (Signed)
This would be very unsual and hard to imagine for this med, and is the first I have heard of a backorder or low inventory for this med;  Please contact pharmacy to confirm

## 2011-02-23 HISTORY — PX: COLONOSCOPY W/ POLYPECTOMY: SHX1380

## 2011-02-25 ENCOUNTER — Encounter: Payer: Self-pay | Admitting: Internal Medicine

## 2011-02-25 NOTE — Progress Notes (Signed)
Quick Note:  Voice message left on [employer]Tree system - lab is negative, normal or otherwise stable, pt to continue same tx ______ 

## 2011-03-03 ENCOUNTER — Telehealth: Payer: Self-pay

## 2011-03-03 NOTE — Telephone Encounter (Signed)
Pt called stating she has blood in her stool this morning. Pt has appt with GI this Friday but is requesting work in appt with Dr [name] today or tomorrow. Pt states she would prefer appt and does not want to go to UC or ER, Please advise.

## 2011-03-04 NOTE — Telephone Encounter (Signed)
Ok for tomorrow,  But should go to ER if further bleeding, dizziness, weakness, or pain

## 2011-03-04 NOTE — Telephone Encounter (Signed)
noted 

## 2011-03-04 NOTE — Telephone Encounter (Signed)
Left message on machine for pt to return my call if she wanted work in appt tomorrow. Pt has appt with GI 04/13

## 2011-03-07 ENCOUNTER — Ambulatory Visit (AMBULATORY_SURGERY_CENTER): Payer: PRIVATE HEALTH INSURANCE | Admitting: *Deleted

## 2011-03-07 VITALS — Ht 65.0 in | Wt 236.2 lb

## 2011-03-07 DIAGNOSIS — Z1211 Encounter for screening for malignant neoplasm of colon: Secondary | ICD-10-CM

## 2011-03-07 MED ORDER — PEG-KCL-NACL-NASULF-NA ASC-C 100 G PO SOLR: 1.0000 | Freq: Once | ORAL | Status: AC

## 2011-03-07 NOTE — Patient Instructions (Signed)
Please pick up prescription at pharmacy Please review instructions

## 2011-03-21 ENCOUNTER — Ambulatory Visit (AMBULATORY_SURGERY_CENTER): Payer: PRIVATE HEALTH INSURANCE | Admitting: Gastroenterology

## 2011-03-21 ENCOUNTER — Encounter: Payer: Self-pay | Admitting: Gastroenterology

## 2011-03-21 DIAGNOSIS — D126 Benign neoplasm of colon, unspecified: Secondary | ICD-10-CM

## 2011-03-21 DIAGNOSIS — Z1211 Encounter for screening for malignant neoplasm of colon: Secondary | ICD-10-CM

## 2011-03-21 DIAGNOSIS — K5730 Diverticulosis of large intestine without perforation or abscess without bleeding: Secondary | ICD-10-CM

## 2011-03-21 LAB — HM COLONOSCOPY

## 2011-03-21 MED ORDER — SODIUM CHLORIDE 0.9 % IV SOLN: 500.0000 mL | INTRAVENOUS | Status: DC

## 2011-03-21 NOTE — Patient Instructions (Signed)
Please refer to green and blue discharge instruction sheets

## 2011-03-24 ENCOUNTER — Telehealth: Payer: Self-pay | Admitting: *Deleted

## 2011-03-24 NOTE — Telephone Encounter (Signed)

## 2011-03-25 ENCOUNTER — Encounter: Payer: Self-pay | Admitting: Gastroenterology

## 2011-09-12 ENCOUNTER — Encounter: Payer: Self-pay | Admitting: Endocrinology

## 2011-09-12 ENCOUNTER — Ambulatory Visit (INDEPENDENT_AMBULATORY_CARE_PROVIDER_SITE_OTHER): Payer: PRIVATE HEALTH INSURANCE | Admitting: Endocrinology

## 2011-09-12 VITALS — BP 116/62 | HR 96 | Temp 98.5°F | Ht 65.0 in | Wt 237.6 lb

## 2011-09-12 DIAGNOSIS — E041 Nontoxic single thyroid nodule: Secondary | ICD-10-CM

## 2011-09-12 MED ORDER — DOXYCYCLINE HYCLATE 100 MG PO TABS: 100.0000 mg | ORAL_TABLET | Freq: Two times a day (BID) | ORAL | Status: AC

## 2011-09-12 MED ORDER — PROMETHAZINE-CODEINE 6.25-10 MG/5ML PO SYRP: 5.0000 mL | ORAL_SOLUTION | ORAL | Status: AC | PRN

## 2011-09-12 NOTE — Patient Instructions (Addendum)
most of the time, a "lumpy thyroid" will eventually become overactive.  this is usually a slow process, happening over the span of many years. Let's recheck the thyroid ultrasound.  you will receive a phone call, about a day and time for an appointment.  After you have had it done, please call [phone] to hear your test results.  You will be prompted to enter the 9-digit "MRN" number that appears at the top left of this page, followed by #.  Then you will hear the message. i have sent a prescription to your pharmacy, for an antibiotic.   here is a sample of "[id number]."  take 1 puff 2x a day.  rinse mouth after using. Here is a prescription for cough syrup. I hope you feel better soon.  If you don't feel better by next week, please call dr [name].

## 2011-09-12 NOTE — Progress Notes (Signed)
  Subjective:    Patient ID: Patricia Wagner, female    DOB: 01-03-1951, 60 y.o.   MRN: [id number]  HPI 3 days of moderate prod-quality cough in the chest, and assoc slight wheezing.   Past Medical History  Diagnosis Date  . Hypertension   . Arthritis   . DIVERTICULITIS, HX OF 07/25/2007  . DIVERTICULOSIS, COLON 07/22/2007  . HYPERLIPIDEMIA 02/03/2008  . HYPERTENSION 07/22/2007  . LEG CRAMPS, NOCTURNAL 02/03/2008  . LOW BACK PAIN 07/25/2007  . MENOPAUSAL DISORDER 01/09/2011  . Morbid obesity 07/22/2007  . Raynaud's syndrome 07/22/2007  . THYROID NODULE, RIGHT 01/04/2010  . URI 02/02/2009  . Edema, peripheral     chronic  . Hypersomnia     declines w/u    Past Surgical History  Procedure Date  . Partial hysterectomy     1 OVARY LEFT  . Colonoscopy   . Polypectomy   . Cholecystectomy     History   Social History  . Marital Status: Single    Spouse Name: N/A    Number of Children: 1  . Years of Education: N/A   Occupational History  . Printing     Social History Main Topics  . Smoking status: Current Everyday Smoker -- 0.5 packs/day    Types: Cigarettes  . Smokeless tobacco: Never Used  . Alcohol Use: 2.4 oz/week    4 Cans of beer per week  . Drug Use: No  . Sexually Active: Not on file   Other Topics Concern  . Not on file   Social History Narrative  . No narrative on file    Current Outpatient Prescriptions on File Prior to Visit  Medication Sig Dispense Refill  . triamterene-hydrochlorothiazide (MAXZIDE) 75-50 MG per tablet Take 1 tablet by mouth daily.          Allergies  Allergen Reactions  . Ace Inhibitors     Family History  Problem Relation Age of Onset  . Asthma Mother   . Diabetes Father   . Heart disease Father   . Thyroid disease Neg Hx    BP 116/62  Pulse 96  Temp(Src) 98.5 F (36.9 C) (Oral)  Ht 5\' 5"  (1.651 m)  Wt 237 lb 9.6 oz (107.775 kg)  BMI 39.54 kg/m2  SpO2 99%  Review of Systems Denies fever.  She does not notice the goiter.       Objective:   Physical Exam VITAL SIGNS:  See vs page GENERAL: no distress head: no deformity eyes: no periorbital swelling, no proptosis external nose and ears are normal mouth: no lesion seen Both eac's and tm's are normal NECK: There is no palpable thyroid enlargement.  Right thyroid fullness is palpable, but no nodule.  No palpable lymphadenopathy at the anterior neck. LUNGS:  Clear to auscultation. Lab Results  Component Value Date   TSH 0.81 01/09/2011      Assessment & Plan:  Samuel [place], new Multinodular goiter.  Euthyroid.

## 2011-09-17 ENCOUNTER — Other Ambulatory Visit (HOSPITAL_COMMUNITY): Payer: PRIVATE HEALTH INSURANCE

## 2011-12-03 ENCOUNTER — Emergency Department (HOSPITAL_BASED_OUTPATIENT_CLINIC_OR_DEPARTMENT_OTHER)
Admission: EM | Admit: 2011-12-03 | Discharge: 2011-12-03 | Disposition: A | Discharge status: Home / Self Care | Payer: No Typology Code available for payment source | Attending: Emergency Medicine | Admitting: Emergency Medicine

## 2011-12-03 ENCOUNTER — Emergency Department (INDEPENDENT_AMBULATORY_CARE_PROVIDER_SITE_OTHER): Payer: No Typology Code available for payment source

## 2011-12-03 ENCOUNTER — Encounter (HOSPITAL_BASED_OUTPATIENT_CLINIC_OR_DEPARTMENT_OTHER): Payer: Self-pay

## 2011-12-03 DIAGNOSIS — E785 Hyperlipidemia, unspecified: Secondary | ICD-10-CM | POA: Insufficient documentation

## 2011-12-03 DIAGNOSIS — S4980XA Other specified injuries of shoulder and upper arm, unspecified arm, initial encounter: Secondary | ICD-10-CM

## 2011-12-03 DIAGNOSIS — F17200 Nicotine dependence, unspecified, uncomplicated: Secondary | ICD-10-CM | POA: Insufficient documentation

## 2011-12-03 DIAGNOSIS — I10 Essential (primary) hypertension: Secondary | ICD-10-CM | POA: Insufficient documentation

## 2011-12-03 DIAGNOSIS — Z79899 Other long term (current) drug therapy: Secondary | ICD-10-CM | POA: Insufficient documentation

## 2011-12-03 DIAGNOSIS — M25519 Pain in unspecified shoulder: Secondary | ICD-10-CM

## 2011-12-03 DIAGNOSIS — W19XXXA Unspecified fall, initial encounter: Secondary | ICD-10-CM

## 2011-12-03 DIAGNOSIS — M25529 Pain in unspecified elbow: Secondary | ICD-10-CM | POA: Insufficient documentation

## 2011-12-03 DIAGNOSIS — IMO0001 Reserved for inherently not codable concepts without codable children: Secondary | ICD-10-CM

## 2011-12-03 DIAGNOSIS — T148XXA Other injury of unspecified body region, initial encounter: Secondary | ICD-10-CM

## 2011-12-03 DIAGNOSIS — W010XXA Fall on same level from slipping, tripping and stumbling without subsequent striking against object, initial encounter: Secondary | ICD-10-CM | POA: Insufficient documentation

## 2011-12-03 MED ORDER — HYDROCODONE-ACETAMINOPHEN 5-500 MG PO TABS: 1.0000 | ORAL_TABLET | Freq: Four times a day (QID) | ORAL | Status: AC | PRN

## 2011-12-03 MED ORDER — HYDROCODONE-ACETAMINOPHEN 5-325 MG PO TABS
1.0000 | ORAL_TABLET | Freq: Once | ORAL | Status: AC
  Administered 2011-12-03: 1 via ORAL
  Filled 2011-12-03: qty 1

## 2011-12-03 NOTE — ED Notes (Signed)
Pt reports she was ambulating, tripped and fell injuring right shoulder and right forearm.

## 2011-12-03 NOTE — ED Provider Notes (Signed)
History     CSN: [id number]  Arrival date & time 12/03/11  22   First MD Initiated Contact with Patient 12/03/11 1652      Chief Complaint  Patient presents with  . Shoulder Injury  . Arm Injury    (Consider location/radiation/quality/duration/timing/severity/associated sxs/prior treatment) HPI  Presents with shoulder pain. She states that just prior to arrival she was walking \\and  tripped. She states that the pavement was uneven. She fell forward onto her right side. She did not hit her head or have loss of consciousness. She denies neck pain, back pain. She complains of right shoulder pain, right elbow pain with an abrasion to the same. Minimal blood loss. She denies numbness, tingling, weakness of her extremities both at the time of injury and in the emergency department. She states her tetanus is up-to-date. She did not take anything for her pain prior to arrival. She denies pre-fall headache, dizziness chest pain, shortness of breath, abdominal pain. Ambulatory since event without hip pain. No weakness.   ED Notes, ED Provider Notes from 12/03/11 0000 to 12/03/11 15:53:18       Viona Gilmore, RN 12/03/2011 15:50      Pt reports she was ambulating, tripped and fell injuring right shoulder and right forearm.     Past Medical History  Diagnosis Date  . Hypertension   . Arthritis   . DIVERTICULITIS, HX OF 07/25/2007  . DIVERTICULOSIS, COLON 07/22/2007  . HYPERLIPIDEMIA 02/03/2008  . HYPERTENSION 07/22/2007  . LEG CRAMPS, NOCTURNAL 02/03/2008  . LOW BACK PAIN 07/25/2007  . MENOPAUSAL DISORDER 01/09/2011  . Morbid obesity 07/22/2007  . Raynaud's syndrome 07/22/2007  . THYROID NODULE, RIGHT 01/04/2010  . URI 02/02/2009  . Edema, peripheral     chronic  . Hypersomnia     declines w/u    Past Surgical History  Procedure Date  . Partial hysterectomy     1 OVARY LEFT  . Colonoscopy   . Polypectomy   . Cholecystectomy     Family History  Problem Relation Age of Onset  . Asthma  Mother   . Diabetes Father   . Heart disease Father   . Thyroid disease Neg Hx     History  Substance Use Topics  . Smoking status: Current Everyday Smoker -- 0.5 packs/day    Types: Cigarettes  . Smokeless tobacco: Never Used  . Alcohol Use: 2.4 oz/week    4 Cans of beer per week    OB History    Grav Para Term Preterm Abortions TAB SAB Ect Mult Living                  Review of Systems  All other systems reviewed and are negative.   except as noted HPI   Allergies  Ace inhibitors  Home Medications   Current Outpatient Rx  Name Route Sig Dispense Refill  . NAPROXEN SODIUM 220 MG PO TABS Oral Take 440 mg by mouth 2 (two) times daily as needed. For pain    . TRIAMTERENE-HCTZ 75-50 MG PO TABS Oral Take 1 tablet by mouth daily.      Marland Kitchen HYDROCODONE-ACETAMINOPHEN 5-500 MG PO TABS Oral Take 1-2 tablets by mouth every 6 (six) hours as needed for pain. 15 tablet 0    BP 174/93  Pulse 72  Temp(Src) 98.2 F (36.8 C) (Oral)  Resp 16  Ht 5\' 5"  (1.651 m)  Wt 235 lb (106.595 kg)  BMI 39.11 kg/m2  Physical Exam  Nursing note and  vitals reviewed. Constitutional: She is oriented to person, place, and time. She appears well-developed.  HENT:  Head: Atraumatic.  Mouth/Throat: Oropharynx is clear and moist.  Eyes: Conjunctivae and EOM are normal. Pupils are equal, round, and reactive to light.  Neck: Normal range of motion. Neck supple.  Cardiovascular: Normal rate, regular rhythm, normal heart sounds and intact distal pulses.   Pulmonary/Chest: Effort normal and breath sounds normal. No respiratory distress. She has no wheezes. She has no rales.  Abdominal: Soft. She exhibits no distension. There is no tenderness. There is no rebound and no guarding.  Musculoskeletal: Normal range of motion.       Right shoulder without ecchymosis, deformity. She has minimal tenderness to palpation mid trapezius, right a.c. joint. There is no tenderness to palpation of the humeral head. She  has tenderness to palpation diffusely in her right scapula. Strength is 5 out of 5 throughout right upper extremity. She has full range of motion of her shoulder. Gross sensation is intact. Capillary refill less than 2 seconds.  Neurological: She is alert and oriented to person, place, and time.  Skin: Skin is warm and dry. No rash noted.     Psychiatric: She has a normal mood and affect.    ED Course  Procedures (including critical care time)  Labs Reviewed - No data to display Dg Shoulder Right  12/03/2011  *RADIOLOGY REPORT*  Clinical Data: 61 year old female with right shoulder pain after fall.  RIGHT SHOULDER - 2+ VIEW  Comparison: None.  Findings: No glenohumeral joint dislocation.   Bone mineralization is within normal limits for age.  Right clavicle and proximal right humerus appear intact.  Joint space loss, subchondral sclerosis and degenerative spurring at the right acromioclavicular joint.  The scapula appears intact.  Visualized right ribs and lung parenchyma are within normal limits.  IMPRESSION: No acute fracture or dislocation identified about the right shoulder.  Original Report Authenticated By: Randall An, M.D.     1. Fall   2. Abrasion   3. Shoulder pain   4. Elevated blood pressure    MDM  Status post mechanical fall with right shoulder pain. Musculoskeletal tenderness to palpation possible bone contusion. Her shoulder x-ray is negative for acute fracture. She is neurovascularly intact. There is an abrasion to her right forearm without underlying bony tenderness to palpation. Wound care. Tetanus is up-to-date. Patient is requesting a sling immobilizer. Discussed extensively with patient the risks of adhesive capsulitis and reviewed range of motion exercises with her for home. Vicodin here and for home- she also has a prescription for naproxen at home. Precautions for return. Recheck BP as outpatient        Blair Heys, MD 12/03/11 [phone]

## 2012-01-25 ENCOUNTER — Other Ambulatory Visit: Payer: Self-pay | Admitting: Internal Medicine

## 2012-03-12 ENCOUNTER — Encounter: Payer: Self-pay | Admitting: Internal Medicine

## 2012-03-12 DIAGNOSIS — Z0001 Encounter for general adult medical examination with abnormal findings: Secondary | ICD-10-CM | POA: Insufficient documentation

## 2012-03-12 DIAGNOSIS — G4710 Hypersomnia, unspecified: Secondary | ICD-10-CM | POA: Insufficient documentation

## 2012-03-15 ENCOUNTER — Encounter: Payer: Self-pay | Admitting: Internal Medicine

## 2012-03-15 ENCOUNTER — Ambulatory Visit (INDEPENDENT_AMBULATORY_CARE_PROVIDER_SITE_OTHER): Payer: PRIVATE HEALTH INSURANCE | Admitting: Internal Medicine

## 2012-03-15 ENCOUNTER — Other Ambulatory Visit (INDEPENDENT_AMBULATORY_CARE_PROVIDER_SITE_OTHER): Payer: PRIVATE HEALTH INSURANCE

## 2012-03-15 VITALS — BP 128/78 | HR 101 | Temp 98.3°F | Ht 65.0 in | Wt 243.5 lb

## 2012-03-15 DIAGNOSIS — IMO0002 Reserved for concepts with insufficient information to code with codable children: Secondary | ICD-10-CM

## 2012-03-15 DIAGNOSIS — M1710 Unilateral primary osteoarthritis, unspecified knee: Secondary | ICD-10-CM

## 2012-03-15 DIAGNOSIS — Z0000 Encounter for general adult medical examination without abnormal findings: Secondary | ICD-10-CM

## 2012-03-15 DIAGNOSIS — M1712 Unilateral primary osteoarthritis, left knee: Secondary | ICD-10-CM | POA: Insufficient documentation

## 2012-03-15 DIAGNOSIS — E785 Hyperlipidemia, unspecified: Secondary | ICD-10-CM

## 2012-03-15 DIAGNOSIS — I10 Essential (primary) hypertension: Secondary | ICD-10-CM

## 2012-03-15 LAB — URINALYSIS, ROUTINE W REFLEX MICROSCOPIC
Bilirubin Urine: NEGATIVE
Ketones, ur: NEGATIVE
Total Protein, Urine: NEGATIVE
pH: 6 (ref 5.0–8.0)

## 2012-03-15 LAB — CBC WITH DIFFERENTIAL/PLATELET
Basophils Relative: 0.4 % (ref 0.0–3.0)
Eosinophils Relative: 2.6 % (ref 0.0–5.0)
HCT: 39.4 % (ref 36.0–46.0)
Lymphs Abs: 1.9 10*3/uL (ref 0.7–4.0)
MCV: 91.1 fl (ref 78.0–100.0)
Monocytes Absolute: 0.5 10*3/uL (ref 0.1–1.0)
RBC: 4.33 Mil/uL (ref 3.87–5.11)
WBC: 7.2 10*3/uL (ref 4.5–10.5)

## 2012-03-15 MED ORDER — TRIAMTERENE-HCTZ 75-50 MG PO TABS: 1.0000 | ORAL_TABLET | Freq: Every day | ORAL | Status: DC

## 2012-03-15 MED ORDER — ASPIRIN 81 MG PO TBEC: 81.0000 mg | DELAYED_RELEASE_TABLET | Freq: Every day | ORAL | Status: AC

## 2012-03-15 MED ORDER — TRAMADOL HCL 50 MG PO TABS: 50.0000 mg | ORAL_TABLET | Freq: Four times a day (QID) | ORAL | Status: AC | PRN

## 2012-03-15 MED ORDER — KETOROLAC TROMETHAMINE 30 MG/ML IJ SOLN
30.0000 mg | Freq: Once | INTRAMUSCULAR | Status: AC
  Administered 2012-03-15: 30 mg via INTRAMUSCULAR

## 2012-03-15 NOTE — Assessment & Plan Note (Signed)
For tramadol prn,  to f/u any worsening symptoms or concerns ?

## 2012-03-15 NOTE — Assessment & Plan Note (Signed)
Mild uncontrolled, for better diet, pt not wanting statin at this time

## 2012-03-15 NOTE — Progress Notes (Signed)
Subjective:    Patient ID: Patricia Wagner, female    DOB: February 05, 1951, 61 y.o.   MRN: [id number]  HPI  Here for wellness and f/u;  Overall doing ok;  Pt denies CP, worsening SOB, DOE, wheezing, orthopnea, PND, worsening LE edema, palpitations, dizziness or syncope.  Pt denies neurological change such as new Headache, facial or extremity weakness.  Pt denies polydipsia, polyuria, or low sugar symptoms. Pt states overall good compliance with treatment and medications, good tolerability, and trying to follow lower cholesterol diet.  Pt denies worsening depressive symptoms, suicidal ideation or panic. No fever, wt loss, night sweats, loss of appetite, or other constitutional symptoms.  Pt states good ability with ADL's, low fall risk, home safety reviewed and adequate, no significant changes in hearing or vision, and occasionally active with exercise.  Does have ongoing left > right knee pain, nsaid not working.  Afraid to f/u with dr Lynann Bologna though cortisone did help last visit.  Trying to follow lower chol diet, not wanting statin - "Im not a pill person." Past Medical History  Diagnosis Date  . Hypertension   . Arthritis   . DIVERTICULITIS, HX OF 07/25/2007  . DIVERTICULOSIS, COLON 07/22/2007  . HYPERLIPIDEMIA 02/03/2008  . HYPERTENSION 07/22/2007  . LEG CRAMPS, NOCTURNAL 02/03/2008  . LOW BACK PAIN 07/25/2007  . MENOPAUSAL DISORDER 01/09/2011  . Morbid obesity 07/22/2007  . Raynaud's syndrome 07/22/2007  . THYROID NODULE, RIGHT 01/04/2010  . URI 02/02/2009  . Edema, peripheral     chronic  . Hypersomnia     declines w/u  . VITAMIN D DEFICIENCY 01/09/2011    Qualifier: Diagnosis of  By: [name], [name]    Past Surgical History  Procedure Date  . Partial hysterectomy     1 OVARY LEFT  . Colonoscopy   . Polypectomy   . Cholecystectomy     reports that she has been smoking Cigarettes.  She has been smoking about .5 packs per day. She has never used smokeless tobacco. She reports that she drinks about  2.4 ounces of alcohol per week. She reports that she does not use illicit drugs. family history includes Asthma in her mother; Diabetes in her father; and Heart disease in her father.  There is no history of Thyroid disease. Allergies  Allergen Reactions  . Ace Inhibitors Palpitations   Current Outpatient Prescriptions on File Prior to Visit  Medication Sig Dispense Refill  . naproxen sodium (ANAPROX) 220 MG tablet Take 440 mg by mouth 2 (two) times daily as needed. For pain      . DISCONTD: triamterene-hydrochlorothiazide (MAXZIDE) 75-50 MG per tablet TAKE 1 TABLET BY MOUTH DAILY  90 tablet  1   No current facility-administered medications on file prior to visit.   Review of Systems Review of Systems  Constitutional: Negative for diaphoresis, activity change, appetite change and unexpected weight change.  HENT: Negative for hearing loss, ear pain, facial swelling, mouth sores and neck stiffness.   Eyes: Negative for pain, redness and visual disturbance.  Respiratory: Negative for shortness of breath and wheezing.   Cardiovascular: Negative for chest pain and palpitations.  Gastrointestinal: Negative for diarrhea, blood in stool, abdominal distention and rectal pain.  Genitourinary: Negative for hematuria, flank pain and decreased urine volume.  Musculoskeletal: Negative for myalgias and joint swelling. except for left knee persistent most days and mild LLE sweling below the knee Skin: Negative for color change and wound.  Neurological: Negative for syncope and numbness.  Hematological: Negative for  adenopathy.  Psychiatric/Behavioral: Negative for hallucinations, self-injury, decreased concentration and agitation.      Objective:   Physical Exam BP 128/78  Pulse 101  Temp(Src) 98.3 F (36.8 C) (Oral)  Ht 5\' 5"  (1.651 m)  Wt 243 lb 8 oz (110.451 kg)  BMI 40.52 kg/m2  SpO2 95% Physical Exam  VS noted Constitutional: Pt is oriented to person, place, and time. Appears  well-developed and well-nourished.  HENT:  Head: Normocephalic and atraumatic.  Right Ear: External ear normal.  Left Ear: External ear normal.  Nose: Nose normal.  Mouth/Throat: Oropharynx is clear and moist.  Eyes: Conjunctivae and EOM are normal. Pupils are equal, round, and reactive to light.  Neck: Normal range of motion. Neck supple. No JVD present. No tracheal deviation present.  Cardiovascular: Normal rate, regular rhythm, normal heart sounds and intact distal pulses.   Pulmonary/Chest: Effort normal and breath sounds normal.  Abdominal: Soft. Bowel sounds are normal. There is no tenderness.  Musculoskeletal: Normal range of motion. Has trace edema below knee with 1+ effusion, + marked crepitus Lymphadenopathy:  Has no cervical adenopathy.  Neurological: Pt is alert and oriented to person, place, and time. Pt has normal reflexes. No cranial nerve deficit.  Skin: Skin is warm and dry. No rash noted.  Psychiatric:  Has  normal mood and affect. Behavior is normal. 1+ nervous    Assessment & Plan:

## 2012-03-15 NOTE — Assessment & Plan Note (Signed)
stable overall by hx and exam, most recent data reviewed with pt, and pt to continue medical treatment as before  BP Readings from Last 3 Encounters:  03/15/12 128/78  12/03/11 174/93  09/12/11 116/62

## 2012-03-15 NOTE — Patient Instructions (Addendum)
You had the toradol pain shot today Take all new medications as prescribed - the tramadol for pain at home Continue all other medications as before - the Blood Pressure pill  (sent to the pharmacy) Please start Aspirin 81 mg - 1 per day - COATED only  (OTC) - to reduce risk of stroke and heart attack Please go to LAB in the Basement for the blood and/or urine tests to be done today You will be contacted by phone if any changes need to be made immediately.  Otherwise, you will receive a letter about your results with an explanation. Please return in 1 year for your yearly visit, or sooner if needed, with Lab testing done 3-5 days before

## 2012-03-15 NOTE — Assessment & Plan Note (Signed)

## 2012-03-16 ENCOUNTER — Encounter: Payer: Self-pay | Admitting: Internal Medicine

## 2012-03-16 LAB — HEPATIC FUNCTION PANEL
AST: 16 U/L (ref 0–37)
Total Bilirubin: 0.6 mg/dL (ref 0.3–1.2)

## 2012-03-16 LAB — LDL CHOLESTEROL, DIRECT: Direct LDL: 121.6 mg/dL

## 2012-03-16 LAB — BASIC METABOLIC PANEL
BUN: 21 mg/dL (ref 6–23)
GFR: 94.53 mL/min (ref >60.00)
Potassium: 3.9 mEq/L (ref 3.5–5.1)

## 2012-03-16 LAB — LIPID PANEL
HDL: 62 mg/dL (ref >39.00)
VLDL: 27.6 mg/dL (ref 0.0–40.0)

## 2012-06-28 LAB — HM MAMMOGRAPHY: HM Mammogram: NORMAL

## 2013-03-24 ENCOUNTER — Other Ambulatory Visit: Payer: Self-pay | Admitting: Internal Medicine

## 2013-04-28 ENCOUNTER — Other Ambulatory Visit: Payer: Self-pay | Admitting: Internal Medicine

## 2013-05-03 ENCOUNTER — Ambulatory Visit (INDEPENDENT_AMBULATORY_CARE_PROVIDER_SITE_OTHER): Payer: PRIVATE HEALTH INSURANCE | Admitting: Internal Medicine

## 2013-05-03 ENCOUNTER — Encounter: Payer: Self-pay | Admitting: Internal Medicine

## 2013-05-03 ENCOUNTER — Other Ambulatory Visit (INDEPENDENT_AMBULATORY_CARE_PROVIDER_SITE_OTHER): Payer: PRIVATE HEALTH INSURANCE

## 2013-05-03 ENCOUNTER — Other Ambulatory Visit: Payer: Self-pay | Admitting: Internal Medicine

## 2013-05-03 VITALS — BP 130/84 | HR 83 | Temp 98.5°F | Ht 65.0 in | Wt 235.5 lb

## 2013-05-03 DIAGNOSIS — Z0000 Encounter for general adult medical examination without abnormal findings: Secondary | ICD-10-CM

## 2013-05-03 DIAGNOSIS — I10 Essential (primary) hypertension: Secondary | ICD-10-CM

## 2013-05-03 DIAGNOSIS — E785 Hyperlipidemia, unspecified: Secondary | ICD-10-CM

## 2013-05-03 LAB — BASIC METABOLIC PANEL
Calcium: 9 mg/dL (ref 8.4–10.5)
GFR: 96.96 mL/min (ref >60.00)
Glucose, Bld: 85 mg/dL (ref 70–99)
Sodium: 140 mEq/L (ref 135–145)

## 2013-05-03 LAB — URINALYSIS, ROUTINE W REFLEX MICROSCOPIC
Ketones, ur: NEGATIVE
Specific Gravity, Urine: 1.02 (ref 1.000–1.030)
Urine Glucose: NEGATIVE
pH: 6 (ref 5.0–8.0)

## 2013-05-03 LAB — CBC WITH DIFFERENTIAL/PLATELET
Basophils Absolute: 0 10*3/uL (ref 0.0–0.1)
Eosinophils Relative: 2.3 % (ref 0.0–5.0)
HCT: 41.5 % (ref 36.0–46.0)
Hemoglobin: 13.9 g/dL (ref 12.0–15.0)
Lymphs Abs: 1.5 10*3/uL (ref 0.7–4.0)
Monocytes Relative: 6.1 % (ref 3.0–12.0)
Neutro Abs: 4.5 10*3/uL (ref 1.4–7.7)
RDW: 13.7 % (ref 11.5–14.6)

## 2013-05-03 LAB — HEPATIC FUNCTION PANEL
Albumin: 3.6 g/dL (ref 3.5–5.2)
Alkaline Phosphatase: 49 U/L (ref 39–117)
Bilirubin, Direct: 0.1 mg/dL (ref 0.0–0.3)

## 2013-05-03 LAB — LIPID PANEL
HDL: 63.1 mg/dL (ref >39.00)
Triglycerides: 84 mg/dL (ref 0.0–149.0)

## 2013-05-03 MED ORDER — TRIAMTERENE-HCTZ 75-50 MG PO TABS: ORAL_TABLET | ORAL | Status: DC

## 2013-05-03 MED ORDER — NAPROXEN SODIUM 220 MG PO TABS: 440.0000 mg | ORAL_TABLET | Freq: Two times a day (BID) | ORAL | Status: DC | PRN

## 2013-05-03 MED ORDER — ASPIRIN 81 MG PO TBEC: 81.0000 mg | DELAYED_RELEASE_TABLET | Freq: Every day | ORAL | Status: DC

## 2013-05-03 MED ORDER — POTASSIUM CHLORIDE ER 10 MEQ PO TBCR: 10.0000 meq | EXTENDED_RELEASE_TABLET | Freq: Two times a day (BID) | ORAL | Status: DC

## 2013-05-03 NOTE — Progress Notes (Signed)
Subjective:    Patient ID: Patricia Wagner, female    DOB: [birth year], 62 y.o.   MRN: [id number]  HPI  Here for wellness and f/u;  Overall doing ok;  Pt denies CP, worsening SOB, DOE, wheezing, orthopnea, PND, worsening LE edema, palpitations, dizziness or syncope.  Pt denies neurological change such as new headache, facial or extremity weakness.  Pt denies polydipsia, polyuria, or low sugar symptoms. Pt states overall good compliance with treatment and medications, good tolerability, and has been trying to follow lower cholesterol diet.  Pt denies worsening depressive symptoms, suicidal ideation or panic. No fever, night sweats, wt loss, loss of appetite, or other constitutional symptoms.  Pt states good ability with ADL's, has low fall risk, home safety reviewed and adequate, no other significant changes in hearing or vision, and only occasionally active with exercise.  No acute complaints Past Medical History  Diagnosis Date  . Hypertension   . Arthritis   . DIVERTICULITIS, HX OF 07/25/2007  . DIVERTICULOSIS, COLON 07/22/2007  . HYPERLIPIDEMIA 02/03/2008  . HYPERTENSION 07/22/2007  . LEG CRAMPS, NOCTURNAL 02/03/2008  . LOW BACK PAIN 07/25/2007  . MENOPAUSAL DISORDER 01/09/2011  . Morbid obesity 07/22/2007  . Raynaud's syndrome 07/22/2007  . THYROID NODULE, RIGHT 01/04/2010  . URI 02/02/2009  . Edema, peripheral     chronic  . Hypersomnia     declines w/u  . VITAMIN D DEFICIENCY 01/09/2011    Qualifier: Diagnosis of  By: [name], [name]    Past Surgical History  Procedure Laterality Date  . Partial hysterectomy      1 OVARY LEFT  . Colonoscopy    . Polypectomy    . Cholecystectomy      reports that she has been smoking Cigarettes.  She has been smoking about 0.50 packs per day. She has never used smokeless tobacco. She reports that she drinks about 2.4 ounces of alcohol per week. She reports that she does not use illicit drugs. family history includes Asthma in her mother; Diabetes in her  father; and Heart disease in her father.  There is no history of Thyroid disease. Allergies  Allergen Reactions  . Ace Inhibitors Palpitations   No current outpatient prescriptions on file prior to visit.   No current facility-administered medications on file prior to visit.   Review of Systems Constitutional: Negative for diaphoresis, activity change, appetite change or unexpected weight change.  HENT: Negative for hearing loss, ear pain, facial swelling, mouth sores and neck stiffness.   Eyes: Negative for pain, redness and visual disturbance.  Respiratory: Negative for shortness of breath and wheezing.   Cardiovascular: Negative for chest pain and palpitations.  Gastrointestinal: Negative for diarrhea, blood in stool, abdominal distention or other pain Genitourinary: Negative for hematuria, flank pain or change in urine volume.  Musculoskeletal: Negative for myalgias and joint swelling.  Skin: Negative for color change and wound.  Neurological: Negative for syncope and numbness. other than noted Hematological: Negative for adenopathy.  Psychiatric/Behavioral: Negative for hallucinations, self-injury, decreased concentration and agitation.      Objective:   Physical Exam BP 130/84  Pulse 83  Temp(Src) 98.5 F (36.9 C) (Oral)  Ht 5\' 5"  (1.651 m)  Wt 235 lb 8 oz (106.822 kg)  BMI 39.19 kg/m2  SpO2 97% VS noted,  Constitutional: Pt is oriented to person, place, and time. Appears well-developed and well-nourished.  Head: Normocephalic and atraumatic.  Right Ear: External ear normal.  Left Ear: External ear normal.  Nose: Nose normal.  Mouth/Throat: Oropharynx is clear and moist.  Eyes: Conjunctivae and EOM are normal. Pupils are equal, round, and reactive to light.  Neck: Normal range of motion. Neck supple. No JVD present. No tracheal deviation present.  Cardiovascular: Normal rate, regular rhythm, normal heart sounds and intact distal pulses.   Pulmonary/Chest: Effort  normal and breath sounds normal.  Abdominal: Soft. Bowel sounds are normal. There is no tenderness. No HSM  Musculoskeletal: Normal range of motion.  Lymphadenopathy:  Has no cervical adenopathy.  Neurological: Pt is alert and oriented to person, place, and time. Pt has normal reflexes. No cranial nerve deficit.  Skin: Skin is warm and dry. No rash noted. Has bilat trace LE edema Psychiatric:  Has  normal mood and affect. Behavior is normal.     Assessment & Plan:

## 2013-05-03 NOTE — Assessment & Plan Note (Signed)
stable overall by history and exam, recent data reviewed with pt, and pt to continue medical treatment as before,  to f/u any worsening symptoms or concerns BP Readings from Last 3 Encounters:  05/03/13 130/84  03/15/12 128/78  12/03/11 174/93

## 2013-05-03 NOTE — Patient Instructions (Signed)
Please continue all other medications as before, and refills have been done if requested. Please have the pharmacy call with any other refills you may need. Please also start Aspirin 81 mg -1 per day - Enteric Coated only You are otherwise up to date with prevention measures today. Please continue your efforts at being more active, low cholesterol diet, and weight control. Please go to the LAB in the Basement (turn left off the elevator) for the tests to be done today You will be contacted by phone if any changes need to be made immediately.  Otherwise, you will receive a letter about your results with an explanation  Thank you for enrolling in [place]. Please follow the instructions below to securely access your online medical record. MyChart allows you to send messages to your doctor, view your test results, renew your prescriptions, schedule appointments, and more.  Please return in 1 year for your yearly visit, or sooner if needed, with Lab testing done 3-5 days before

## 2013-05-03 NOTE — Assessment & Plan Note (Signed)
stable overall by history and exam, recent data reviewed with pt, and pt to continue medical treatment as before,  to f/u any worsening symptoms or concerns Lab Results  Component Value Date   LDLCALC 120* 05/03/2013   For lower chol diet

## 2013-05-03 NOTE — Assessment & Plan Note (Signed)

## 2013-06-29 ENCOUNTER — Other Ambulatory Visit (INDEPENDENT_AMBULATORY_CARE_PROVIDER_SITE_OTHER): Payer: PRIVATE HEALTH INSURANCE

## 2013-06-29 ENCOUNTER — Encounter: Payer: Self-pay | Admitting: Internal Medicine

## 2013-06-29 ENCOUNTER — Ambulatory Visit (INDEPENDENT_AMBULATORY_CARE_PROVIDER_SITE_OTHER): Payer: PRIVATE HEALTH INSURANCE | Admitting: Internal Medicine

## 2013-06-29 VITALS — BP 112/80 | HR 109 | Temp 98.2°F | Ht 65.0 in | Wt 230.1 lb

## 2013-06-29 DIAGNOSIS — K921 Melena: Secondary | ICD-10-CM

## 2013-06-29 DIAGNOSIS — I10 Essential (primary) hypertension: Secondary | ICD-10-CM

## 2013-06-29 LAB — HM MAMMOGRAPHY

## 2013-06-29 LAB — CBC WITH DIFFERENTIAL/PLATELET
Basophils Relative: 0.3 % (ref 0.0–3.0)
Eosinophils Absolute: 0.1 10*3/uL (ref 0.0–0.7)
MCHC: 32.7 g/dL (ref 30.0–36.0)
MCV: 91.1 fl (ref 78.0–100.0)
Monocytes Absolute: 0.4 10*3/uL (ref 0.1–1.0)
Neutrophils Relative %: 72.6 % (ref 43.0–77.0)
Platelets: 197 10*3/uL (ref 150.0–400.0)

## 2013-06-29 LAB — BASIC METABOLIC PANEL
BUN: 24 mg/dL — ABNORMAL HIGH (ref 6–23)
Calcium: 9.7 mg/dL (ref 8.4–10.5)
GFR: 67.28 mL/min (ref >60.00)
Potassium: 3.9 mEq/L (ref 3.5–5.1)
Sodium: 138 mEq/L (ref 135–145)

## 2013-06-29 LAB — PROTIME-INR: Prothrombin Time: 11.3 s (ref 10.2–12.4)

## 2013-06-29 NOTE — Progress Notes (Signed)
Subjective:    Patient ID: Patricia Wagner, female    DOB: [birth year], 62 y.o.   MRN: [id number]  HPI  Here with daily episodes BRBPR x 3 days, rather large amount to her with first stool 3 days ago, only on tissue since then, without abd pain, fever, n/v, dizziness, falling.  Colonoscopy 2012 with diverticulosis.    Pt denies chest pain, increased sob or doe, wheezing, orthopnea, PND, increased LE swelling, palpitations, dizziness or syncope.  Pt denies new neurological symptoms such as new headache, or facial or extremity weakness or numbness.   Pt denies polydipsia, polyuria. Past Medical History  Diagnosis Date  . Hypertension   . Arthritis   . DIVERTICULITIS, HX OF 07/25/2007  . DIVERTICULOSIS, COLON 07/22/2007  . HYPERLIPIDEMIA 02/03/2008  . HYPERTENSION 07/22/2007  . LEG CRAMPS, NOCTURNAL 02/03/2008  . LOW BACK PAIN 07/25/2007  . MENOPAUSAL DISORDER 01/09/2011  . Morbid obesity 07/22/2007  . Raynaud's syndrome 07/22/2007  . THYROID NODULE, RIGHT 01/04/2010  . URI 02/02/2009  . Edema, peripheral     chronic  . Hypersomnia     declines w/u  . VITAMIN D DEFICIENCY 01/09/2011    Qualifier: Diagnosis of  By: [name], [name]    Past Surgical History  Procedure Laterality Date  . Partial hysterectomy      1 OVARY LEFT  . Colonoscopy    . Polypectomy    . Cholecystectomy      reports that she has been smoking Cigarettes.  She has been smoking about 0.50 packs per day. She has never used smokeless tobacco. She reports that she drinks about 2.4 ounces of alcohol per week. She reports that she does not use illicit drugs. family history includes Asthma in her mother; Diabetes in her father; and Heart disease in her father.  There is no history of Thyroid disease. Allergies  Allergen Reactions  . Ace Inhibitors Palpitations   Current Outpatient Prescriptions on File Prior to Visit  Medication Sig Dispense Refill  . aspirin 81 MG EC tablet Take 1 tablet (81 mg total) by mouth daily. Swallow  whole.  30 tablet  12  . potassium chloride (KLOR-CON 10) 10 MEQ tablet Take 1 tablet (10 mEq total) by mouth 2 (two) times daily.  90 tablet  3  . triamterene-hydrochlorothiazide (MAXZIDE) 75-50 MG per tablet TAKE 1 TABLET BY MOUTH EVERY DAY  90 tablet  3   No current facility-administered medications on file prior to visit.   Review of Systems  Constitutional: Negative for unexpected weight change, or unusual diaphoresis  HENT: Negative for tinnitus.   Eyes: Negative for photophobia and visual disturbance.  Respiratory: Negative for choking and stridor.   Gastrointestinal: Negative for vomiting and blood in stool.  Genitourinary: Negative for hematuria and decreased urine volume.  Musculoskeletal: Negative for acute joint swelling Skin: Negative for color change and wound.  Neurological: Negative for tremors and numbness other than noted  Psychiatric/Behavioral: Negative for decreased concentration or  hyperactivity.       Objective:   Physical Exam BP 112/80  Pulse 109  Temp(Src) 98.2 F (36.8 C) (Oral)  Ht 5\' 5"  (1.651 m)  Wt 230 lb 2 oz (104.384 kg)  BMI 38.29 kg/m2  SpO2 95% VS noted,  Constitutional: Pt appears well-developed and well-nourished.  HENT: Head: NCAT.  Right Ear: External ear normal.  Left Ear: External ear normal.  Eyes: Conjunctivae and EOM are normal. Pupils are equal, round, and reactive to light.  Neck: Normal range  of motion. Neck supple.  Cardiovascular: Normal rate and regular rhythm.   Pulmonary/Chest: Effort normal and breath sounds normal.  Abd:  Soft, NT, non-distended, + BS Neurological: Pt is alert. Not confused  Skin: Skin is warm. No erythema.  Psychiatric: Pt behavior is normal. Thought content normal.     Assessment & Plan:

## 2013-06-29 NOTE — Assessment & Plan Note (Addendum)
Small volume but daily episodes with less each day x 3, likely due to diverticulosis, not orthostatic, no pain or fever, for labs today, urged to ER if worsens, or develops symptoms, last colonscopy 2012, pt asks for GI referral as well

## 2013-06-29 NOTE — Assessment & Plan Note (Signed)
stable overall by history and exam, recent data reviewed with pt, and pt to continue medical treatment as before,  to f/u any worsening symptoms or concerns BP Readings from Last 3 Encounters:  06/29/13 112/80  05/03/13 130/84  03/15/12 128/78

## 2013-06-29 NOTE — Patient Instructions (Signed)
You do not appear to have dehydration today, as the blood pressures were ok. Please continue all other medications as before, and refills have been done if requested. Please have the pharmacy call with any other refills you may need.  Please go to the LAB in the Basement (turn left off the elevator) for the tests to be done today You will be contacted by phone if any changes need to be made immediately.  Otherwise, you will receive a letter about your results with an explanation, but please check with MyChart first.  Please remember to sign up for My Chart if you have not done so, as this will be important to you in the future with finding out test results, communicating by private email, and scheduling acute appointments online when needed.  You will be contacted regarding the referral for: Gastroenterology  Please consider going to ER for any worsening of the bleeding, or if you develop weakness, dizziness, falling, pain, fever, or other unusual symptoms.

## 2013-07-27 ENCOUNTER — Other Ambulatory Visit: Payer: Self-pay

## 2013-07-27 MED ORDER — TRIAMTERENE-HCTZ 75-50 MG PO TABS: ORAL_TABLET | ORAL | Status: DC

## 2013-07-27 MED ORDER — POTASSIUM CHLORIDE ER 10 MEQ PO TBCR: 10.0000 meq | EXTENDED_RELEASE_TABLET | Freq: Two times a day (BID) | ORAL | Status: DC

## 2013-08-18 ENCOUNTER — Encounter: Payer: Self-pay | Admitting: Internal Medicine

## 2014-07-11 ENCOUNTER — Encounter: Payer: Self-pay | Admitting: Internal Medicine

## 2014-07-11 ENCOUNTER — Ambulatory Visit (INDEPENDENT_AMBULATORY_CARE_PROVIDER_SITE_OTHER): Payer: Managed Care, Other (non HMO) | Admitting: Internal Medicine

## 2014-07-11 VITALS — BP 128/80 | HR 85 | Temp 98.6°F | Wt 235.2 lb

## 2014-07-11 DIAGNOSIS — Z0000 Encounter for general adult medical examination without abnormal findings: Secondary | ICD-10-CM

## 2014-07-11 DIAGNOSIS — R609 Edema, unspecified: Secondary | ICD-10-CM

## 2014-07-11 MED ORDER — FUROSEMIDE 20 MG PO TABS: ORAL_TABLET | ORAL | Status: DC

## 2014-07-11 MED ORDER — TRIAMTERENE-HCTZ 75-50 MG PO TABS: ORAL_TABLET | ORAL | Status: DC

## 2014-07-11 NOTE — Assessment & Plan Note (Signed)

## 2014-07-11 NOTE — Progress Notes (Signed)
Subjective:    Patient ID: Patricia Wagner, female    DOB: [birth year], 63 y.o.   MRN: [id number]  HPI  Here for wellness and f/u;  Overall doing ok;  Pt denies CP, worsening SOB, DOE, wheezing, orthopnea, PND, worsening LE edema, palpitations, dizziness or syncope.  Pt denies neurological change such as new headache, facial or extremity weakness.  Pt denies polydipsia, polyuria, or low sugar symptoms. Pt states overall good compliance with treatment and medications, good tolerability, and has been trying to follow lower cholesterol diet.  Pt denies worsening depressive symptoms, suicidal ideation or panic. No fever, night sweats, wt loss, loss of appetite, or other constitutional symptoms.  Pt states good ability with ADL's, has low fall risk, home safety reviewed and adequate, no other significant changes in hearing or vision, and only occasionally active with exercise. No current complaints.  C/o edema to legs that resolves, returns in the next day. Wt up 5 lbs from last yr.   Past Medical History  Diagnosis Date  . Hypertension   . Arthritis   . DIVERTICULITIS, HX OF 07/25/2007  . DIVERTICULOSIS, COLON 07/22/2007  . HYPERLIPIDEMIA 02/03/2008  . HYPERTENSION 07/22/2007  . LEG CRAMPS, NOCTURNAL 02/03/2008  . LOW BACK PAIN 07/25/2007  . MENOPAUSAL DISORDER 01/09/2011  . Morbid obesity 07/22/2007  . Raynaud's syndrome 07/22/2007  . THYROID NODULE, RIGHT 01/04/2010  . URI 02/02/2009  . Edema, peripheral     chronic  . Hypersomnia     declines w/u  . VITAMIN D DEFICIENCY 01/09/2011    Qualifier: Diagnosis of  By: [name], [name]    Past Surgical History  Procedure Laterality Date  . Partial hysterectomy      1 OVARY LEFT  . Colonoscopy    . Polypectomy    . Cholecystectomy      reports that she has been smoking Cigarettes.  She has been smoking about 0.50 packs per day. She has never used smokeless tobacco. She reports that she drinks about 2.4 ounces of alcohol per week. She reports that she  does not use illicit drugs. family history includes Asthma in her mother; Diabetes in her father; Heart disease in her father. There is no history of Thyroid disease. Allergies  Allergen Reactions  . Ace Inhibitors Palpitations   Current Outpatient Prescriptions on File Prior to Visit  Medication Sig Dispense Refill  . aspirin 81 MG EC tablet Take 1 tablet (81 mg total) by mouth daily. Swallow whole.  30 tablet  12  . potassium chloride (KLOR-CON 10) 10 MEQ tablet Take 1 tablet (10 mEq total) by mouth 2 (two) times daily.  90 tablet  3   No current facility-administered medications on file prior to visit.   Review of Systems Constitutional: Negative for increased diaphoresis, other activity, appetite or other siginficant weight change  HENT: Negative for worsening hearing loss, ear pain, facial swelling, mouth sores and neck stiffness.   Eyes: Negative for other worsening pain, redness or visual disturbance.  Respiratory: Negative for shortness of breath and wheezing.   Cardiovascular: Negative for chest pain and palpitations.  Gastrointestinal: Negative for diarrhea, blood in stool, abdominal distention or other pain Genitourinary: Negative for hematuria, flank pain or change in urine volume.  Musculoskeletal: Negative for myalgias or other joint complaints.  Skin: Negative for color change and wound.  Neurological: Negative for syncope and numbness. other than noted Hematological: Negative for adenopathy. or other swelling Psychiatric/Behavioral: Negative for hallucinations, self-injury, decreased concentration or other worsening  agitation.      Objective:   Physical Exam BP 128/80  Pulse 85  Temp(Src) 98.6 F (37 C) (Oral)  Wt 235 lb 4 oz (106.709 kg)  SpO2 98% VS noted,  Constitutional: Pt is oriented to person, place, and time. Appears well-developed and well-nourished.  Head: Normocephalic and atraumatic.  Right Ear: External ear normal.  Left Ear: External ear normal.    Nose: Nose normal.  Mouth/Throat: Oropharynx is clear and moist.  Eyes: Conjunctivae and EOM are normal. Pupils are equal, round, and reactive to light.  Neck: Normal range of motion. Neck supple. No JVD present. No tracheal deviation present.  Cardiovascular: Normal rate, regular rhythm, normal heart sounds and intact distal pulses.   Pulmonary/Chest: Effort normal and breath sounds without rales or wheezing  Abdominal: Soft. Bowel sounds are normal. NT. No HSM  Musculoskeletal: Normal range of motion. Exhibits trace to 1+ edema bilat to knees  Lymphadenopathy:  Has no cervical adenopathy.  Neurological: Pt is alert and oriented to person, place, and time. Pt has normal reflexes. No cranial nerve deficit. Motor grossly intact Skin: Skin is warm and dry. No rash noted.  Psychiatric:  Has normal mood and affect. Behavior is normal.     Assessment & Plan:   Wt Readings from Last 3 Encounters:  07/11/14 235 lb 4 oz (106.709 kg)  06/29/13 230 lb 2 oz (104.384 kg)  05/03/13 235 lb 8 oz (106.822 kg)

## 2014-07-11 NOTE — Progress Notes (Signed)
Pre visit review using our clinic review tool, if applicable. No additional management support is needed unless otherwise documented below in the visit note. 

## 2014-07-11 NOTE — Patient Instructions (Signed)
Please take all new medication as prescribed - the lasix 20 mg extra up to twice per wk as needed  Please continue all other medications as before, and refills have been done if requested.  Please have the pharmacy call with any other refills you may need.  Please continue your efforts at being more active, low cholesterol diet, and weight control.  You are otherwise up to date with prevention measures today.  Please keep your appointments with your specialists as you may have planned  Please go to the LAB in the Basement (turn left off the elevator) for the tests to be done today  You will be contacted by phone if any changes need to be made immediately.  Otherwise, you will receive a letter about your results with an explanation, but please check with MyChart first.  Please remember to sign up for MyChart if you have not done so, as this will be important to you in the future with finding out test results, communicating by private email, and scheduling acute appointments online when needed.  Please return in 6 months, or sooner if needed

## 2014-07-11 NOTE — Assessment & Plan Note (Signed)
C/w venous insufficiency, for limited lasix prn, leg elevation, low salt, wt control; declines compression stockings

## 2014-08-28 ENCOUNTER — Encounter: Payer: Self-pay | Admitting: Gastroenterology

## 2014-11-01 ENCOUNTER — Other Ambulatory Visit: Payer: Self-pay | Admitting: Obstetrics and Gynecology

## 2014-11-02 LAB — CYTOLOGY - PAP

## 2015-07-24 ENCOUNTER — Other Ambulatory Visit: Payer: Self-pay | Admitting: Internal Medicine

## 2015-10-25 ENCOUNTER — Other Ambulatory Visit: Payer: Self-pay | Admitting: Internal Medicine

## 2015-10-25 ENCOUNTER — Other Ambulatory Visit (INDEPENDENT_AMBULATORY_CARE_PROVIDER_SITE_OTHER): Payer: Managed Care, Other (non HMO)

## 2015-10-25 ENCOUNTER — Encounter: Payer: Self-pay | Admitting: Internal Medicine

## 2015-10-25 ENCOUNTER — Ambulatory Visit (INDEPENDENT_AMBULATORY_CARE_PROVIDER_SITE_OTHER): Payer: Managed Care, Other (non HMO) | Admitting: Internal Medicine

## 2015-10-25 VITALS — BP 140/86 | Temp 98.5°F | Ht 65.0 in | Wt 236.0 lb

## 2015-10-25 DIAGNOSIS — Z23 Encounter for immunization: Secondary | ICD-10-CM

## 2015-10-25 DIAGNOSIS — I83813 Varicose veins of bilateral lower extremities with pain: Secondary | ICD-10-CM

## 2015-10-25 DIAGNOSIS — Z0000 Encounter for general adult medical examination without abnormal findings: Secondary | ICD-10-CM

## 2015-10-25 DIAGNOSIS — I10 Essential (primary) hypertension: Secondary | ICD-10-CM

## 2015-10-25 LAB — CBC WITH DIFFERENTIAL/PLATELET
BASOS ABS: 0 10*3/uL (ref 0.0–0.1)
Basophils Relative: 0.4 % (ref 0.0–3.0)
EOS PCT: 1.4 % (ref 0.0–5.0)
Eosinophils Absolute: 0.1 10*3/uL (ref 0.0–0.7)
HCT: 42.6 % (ref 36.0–46.0)
Hemoglobin: 13.8 g/dL (ref 12.0–15.0)
LYMPHS ABS: 1.3 10*3/uL (ref 0.7–4.0)
Lymphocytes Relative: 18.9 % (ref 12.0–46.0)
MCHC: 32.4 g/dL (ref 30.0–36.0)
MCV: 89.6 fl (ref 78.0–100.0)
MONO ABS: 0.4 10*3/uL (ref 0.1–1.0)
Monocytes Relative: 6.5 % (ref 3.0–12.0)
NEUTROS PCT: 72.8 % (ref 43.0–77.0)
Neutro Abs: 4.9 10*3/uL (ref 1.4–7.7)
Platelets: 188 10*3/uL (ref 150.0–400.0)
RBC: 4.76 Mil/uL (ref 3.87–5.11)
RDW: 13.8 % (ref 11.5–15.5)
WBC: 6.7 10*3/uL (ref 4.0–10.5)

## 2015-10-25 LAB — BASIC METABOLIC PANEL
BUN: 28 mg/dL — ABNORMAL HIGH (ref 6–23)
CHLORIDE: 101 meq/L (ref 96–112)
CO2: 34 meq/L — AB (ref 19–32)
Calcium: 9.9 mg/dL (ref 8.4–10.5)
Creatinine, Ser: 1.16 mg/dL (ref 0.40–1.20)
GFR: 60.83 mL/min (ref >60.00)
GLUCOSE: 92 mg/dL (ref 70–99)
POTASSIUM: 3.9 meq/L (ref 3.5–5.1)
SODIUM: 141 meq/L (ref 135–145)

## 2015-10-25 LAB — URINALYSIS, ROUTINE W REFLEX MICROSCOPIC
BILIRUBIN URINE: NEGATIVE
HGB URINE DIPSTICK: NEGATIVE
KETONES UR: NEGATIVE
LEUKOCYTES UA: NEGATIVE
Nitrite: POSITIVE — AB
RBC / HPF: NONE SEEN (ref 0–?)
SPECIFIC GRAVITY, URINE: 1.015 (ref 1.000–1.030)
Total Protein, Urine: NEGATIVE
URINE GLUCOSE: NEGATIVE
UROBILINOGEN UA: 0.2 (ref 0.0–1.0)
pH: 6.5 (ref 5.0–8.0)

## 2015-10-25 LAB — TSH: TSH: 0.6 u[IU]/mL (ref 0.35–4.50)

## 2015-10-25 LAB — LIPID PANEL
CHOLESTEROL: 217 mg/dL — AB (ref 0–200)
HDL: 63.4 mg/dL (ref >39.00)
LDL Cholesterol: 137 mg/dL — ABNORMAL HIGH (ref 0–99)
NonHDL: 153.39
Total CHOL/HDL Ratio: 3
Triglycerides: 80 mg/dL (ref 0.0–149.0)
VLDL: 16 mg/dL (ref 0.0–40.0)

## 2015-10-25 LAB — HEPATIC FUNCTION PANEL
ALBUMIN: 3.9 g/dL (ref 3.5–5.2)
ALK PHOS: 61 U/L (ref 39–117)
ALT: 11 U/L (ref 0–35)
AST: 13 U/L (ref 0–37)
Bilirubin, Direct: 0.1 mg/dL (ref 0.0–0.3)
TOTAL PROTEIN: 7.8 g/dL (ref 6.0–8.3)
Total Bilirubin: 0.6 mg/dL (ref 0.2–1.2)

## 2015-10-25 MED ORDER — FUROSEMIDE 20 MG PO TABS: ORAL_TABLET | ORAL | Status: DC

## 2015-10-25 MED ORDER — TRIAMTERENE-HCTZ 75-50 MG PO TABS: 1.0000 | ORAL_TABLET | Freq: Every day | ORAL | Status: DC

## 2015-10-25 NOTE — Patient Instructions (Addendum)
You had the flu shot today  Please continue all other medications as before, and refills have been done if requested.  Please have the pharmacy call with any other refills you may need.  Please continue your efforts at being more active, low cholesterol diet, and weight control.  You are otherwise up to date with prevention measures today.  You will be contacted regarding the referral for: Vascular Surgury for the veins  Please keep your appointments with your specialists as you may have planned  Please go to the LAB in the Basement (turn left off the elevator) for the tests to be done today  You will be contacted by phone if any changes need to be made immediately.  Otherwise, you will receive a letter about your results with an explanation, but please check with MyChart first.  Please remember to sign up for MyChart if you have not done so, as this will be important to you in the future with finding out test results, communicating by private email, and scheduling acute appointments online when needed.  Please return in 1 year for your yearly visit, or sooner if needed, with Lab testing done 3-5 days before`

## 2015-10-25 NOTE — Assessment & Plan Note (Signed)

## 2015-10-25 NOTE — Assessment & Plan Note (Signed)
stable overall by history and exam, recent data reviewed with pt, and pt to continue medical treatment as before,  to f/u any worsening symptoms or concerns BP Readings from Last 3 Encounters:  10/25/15 140/86  07/11/14 128/80  06/29/13 112/80

## 2015-10-25 NOTE — Progress Notes (Signed)
Pre visit review using our clinic review tool, if applicable. No additional management support is needed unless otherwise documented below in the visit note. 

## 2015-10-25 NOTE — Progress Notes (Signed)
Subjective:    Patient ID: Patricia Wagner, female    DOB: 06/26/1951, 64 y.o.   MRN: [id number]  HPI  Here for wellness and f/u;  Overall doing ok;  Pt denies Chest pain, worsening SOB, DOE, wheezing, orthopnea, PND, worsening LE edema, palpitations, dizziness or syncope.  Pt denies neurological change such as new headache, facial or extremity weakness.  Pt denies polydipsia, polyuria, or low sugar symptoms. Pt states overall good compliance with treatment and medications, good tolerability, and has been trying to follow appropriate diet.  Pt denies worsening depressive symptoms, suicidal ideation or panic. No fever, night sweats, wt loss, loss of appetite, or other constitutional symptoms.  Pt states good ability with ADL's, has low fall risk, home safety reviewed and adequate, no other significant changes in hearing or vision, and only occasionally active with exercise. C/o painful varicose veins to bilat lateral knees and thighs, some lower legs as well Past Medical History  Diagnosis Date  . Hypertension   . Arthritis   . DIVERTICULITIS, HX OF 07/25/2007  . DIVERTICULOSIS, COLON 07/22/2007  . HYPERLIPIDEMIA 02/03/2008  . HYPERTENSION 07/22/2007  . LEG CRAMPS, NOCTURNAL 02/03/2008  . LOW BACK PAIN 07/25/2007  . MENOPAUSAL DISORDER 01/09/2011  . Morbid obesity ([place]) 07/22/2007  . Raynaud's syndrome 07/22/2007  . THYROID NODULE, RIGHT 01/04/2010  . URI 02/02/2009  . Edema, peripheral     chronic  . Hypersomnia     declines w/u  . VITAMIN D DEFICIENCY 01/09/2011    Qualifier: Diagnosis of  By: [name], [name]    Past Surgical History  Procedure Laterality Date  . Partial hysterectomy      1 OVARY LEFT  . Colonoscopy    . Polypectomy    . Cholecystectomy      reports that she has been smoking Cigarettes.  She has been smoking about 0.50 packs per day. She has never used smokeless tobacco. She reports that she drinks about 2.4 oz of alcohol per week. She reports that she does not use illicit  drugs. family history includes Asthma in her mother; Diabetes in her father; Heart disease in her father. There is no history of Thyroid disease. Allergies  Allergen Reactions  . Ace Inhibitors Palpitations   Current Outpatient Prescriptions on File Prior to Visit  Medication Sig Dispense Refill  . aspirin 81 MG EC tablet Take 1 tablet (81 mg total) by mouth daily. Swallow whole. (Patient not taking: Reported on 10/25/2015) 30 tablet 12   No current facility-administered medications on file prior to visit.   Review of Systems Constitutional: Negative for increased diaphoresis, other activity, appetite or siginficant weight change other than noted HENT: Negative for worsening hearing loss, ear pain, facial swelling, mouth sores and neck stiffness.   Eyes: Negative for other worsening pain, redness or visual disturbance.  Respiratory: Negative for shortness of breath and wheezing  Cardiovascular: Negative for chest pain and palpitations.  Gastrointestinal: Negative for diarrhea, blood in stool, abdominal distention or other pain Genitourinary: Negative for hematuria, flank pain or change in urine volume.  Musculoskeletal: Negative for myalgias or other joint complaints.  Skin: Negative for color change and wound or drainage.  Neurological: Negative for syncope and numbness. other than noted Hematological: Negative for adenopathy. or other swelling Psychiatric/Behavioral: Negative for hallucinations, SI, self-injury, decreased concentration or other worsening agitation.      Objective:   Physical Exam BP 140/86 mmHg  Temp(Src) 98.5 F (36.9 C) (Oral)  Ht 5\' 5"  (1.651 m)  Wt 236 lb (107.049 kg)  BMI 39.27 kg/m2 VS noted,  Constitutional: Pt is oriented to person, place, and time. Appears well-developed and well-nourished, in no significant distress Head: Normocephalic and atraumatic.  Right Ear: External ear normal.  Left Ear: External ear normal.  Nose: Nose normal.    Mouth/Throat: Oropharynx is clear and moist.  Eyes: Conjunctivae and EOM are normal. Pupils are equal, round, and reactive to light.  Neck: Normal range of motion. Neck supple. No JVD present. No tracheal deviation present or significant neck LA or mass Cardiovascular: Normal rate, regular rhythm, normal heart sounds and intact distal pulses.   Pulmonary/Chest: Effort normal and breath sounds without rales or wheezing  Abdominal: Soft. Bowel sounds are normal. NT. No HSM  Musculoskeletal: Normal range of motion. Exhibits no edema.  Lymphadenopathy:  Has no cervical adenopathy.  Neurological: Pt is alert and oriented to person, place, and time. Pt has normal reflexes. No cranial nerve deficit. Motor grossly intact Skin: Skin is warm and dry. No rash noted. has several tender soft veins varicose to bilat legs Psychiatric:  Has normal mood and affect. Behavior is normal.     Assessment & Plan:

## 2015-10-26 ENCOUNTER — Other Ambulatory Visit: Payer: Self-pay | Admitting: Internal Medicine

## 2015-10-26 ENCOUNTER — Encounter: Payer: Self-pay | Admitting: Internal Medicine

## 2015-10-26 LAB — HEPATITIS C ANTIBODY: HCV Ab: NEGATIVE

## 2015-10-26 MED ORDER — ROSUVASTATIN CALCIUM 10 MG PO TABS: 10.0000 mg | ORAL_TABLET | Freq: Every day | ORAL | Status: DC

## 2015-11-25 DIAGNOSIS — I639 Cerebral infarction, unspecified: Secondary | ICD-10-CM

## 2015-11-25 HISTORY — DX: Cerebral infarction, unspecified: I63.9

## 2015-11-30 ENCOUNTER — Other Ambulatory Visit: Payer: Self-pay | Admitting: *Deleted

## 2015-11-30 DIAGNOSIS — I8390 Asymptomatic varicose veins of unspecified lower extremity: Secondary | ICD-10-CM

## 2015-12-12 ENCOUNTER — Encounter: Payer: Self-pay | Admitting: Vascular Surgery

## 2015-12-20 ENCOUNTER — Encounter: Payer: Self-pay | Admitting: Vascular Surgery

## 2015-12-20 ENCOUNTER — Ambulatory Visit (HOSPITAL_COMMUNITY)
Admission: RE | Admit: 2015-12-20 | Discharge: 2015-12-20 | Disposition: A | Discharge status: Home / Self Care | Payer: Managed Care, Other (non HMO) | Source: Ambulatory Visit | Attending: Vascular Surgery | Admitting: Vascular Surgery

## 2015-12-20 ENCOUNTER — Ambulatory Visit (INDEPENDENT_AMBULATORY_CARE_PROVIDER_SITE_OTHER): Payer: Managed Care, Other (non HMO) | Admitting: Vascular Surgery

## 2015-12-20 VITALS — BP 157/103 | HR 79 | Ht 65.0 in | Wt 229.0 lb

## 2015-12-20 DIAGNOSIS — I83813 Varicose veins of bilateral lower extremities with pain: Secondary | ICD-10-CM

## 2015-12-20 DIAGNOSIS — I868 Varicose veins of other specified sites: Secondary | ICD-10-CM

## 2015-12-20 DIAGNOSIS — I8390 Asymptomatic varicose veins of unspecified lower extremity: Secondary | ICD-10-CM

## 2015-12-20 NOTE — Progress Notes (Signed)
[facility] [place] HISTORY AND PHYSICAL   Referring Physician:  Dr. Cathlean Cower History of Present Illness:  Patient is a 65 y.o. female who presents for evaluation of bilateral lower extremity varicose veins.  She states that the right leg bothers her more than the left. Recently she was having some pain along the lateral aspect of her knee and also noted some brown color change in this area. She denies prior history of DVT. She has no family history of varicose veins. She does develop some swelling in the legs as the day proceeds and some achiness fullness and heaviness. This improves overnight with elevation of her legs. She apparently has worn some light compression in the past but was not very compliant with this. She denies any prior ulcers on her legs. Other medical problems include  Hypertension, bilateral knee arthritis, hyperlipidemia  all of which are stable.  Past Medical History  Diagnosis Date  . Hypertension   . Arthritis   . DIVERTICULITIS, HX OF 07/25/2007  . DIVERTICULOSIS, COLON 07/22/2007  . HYPERLIPIDEMIA 02/03/2008  . HYPERTENSION 07/22/2007  . LEG CRAMPS, NOCTURNAL 02/03/2008  . LOW BACK PAIN 07/25/2007  . MENOPAUSAL DISORDER 01/09/2011  . Morbid obesity ([place]) 07/22/2007  . Raynaud's syndrome 07/22/2007  . THYROID NODULE, RIGHT 01/04/2010  . URI 02/02/2009  . Edema, peripheral     chronic  . Hypersomnia     declines w/u  . VITAMIN D DEFICIENCY 01/09/2011    Qualifier: Diagnosis of  By: [name], [name]     Past Surgical History  Procedure Laterality Date  . Partial hysterectomy      1 OVARY LEFT  . Colonoscopy    . Polypectomy    . Cholecystectomy      Social History Social History  Substance Use Topics  . Smoking status: Current Every Day Smoker -- 0.50 packs/day    Types: Cigarettes  . Smokeless tobacco: Never Used  . Alcohol Use: 2.4 oz/week    4 Cans of beer per week    Family History Family History  Problem Relation Age of Onset   . Asthma Mother   . Diabetes Father   . Heart disease Father   . Thyroid disease Neg Hx     Allergies  Allergies  Allergen Reactions  . Ace Inhibitors Palpitations     Current Outpatient Prescriptions  Medication Sig Dispense Refill  . rosuvastatin (CRESTOR) 10 MG tablet Take 1 tablet (10 mg total) by mouth daily. 90 tablet 3  . triamterene-hydrochlorothiazide (MAXZIDE) 75-50 MG tablet Take 1 tablet by mouth daily. 30 tablet 11  . aspirin 81 MG EC tablet Take 1 tablet (81 mg total) by mouth daily. Swallow whole. (Patient not taking: Reported on 10/25/2015) 30 tablet 12  . furosemide (LASIX) 20 MG tablet 1 tab by mouth twice per wk as needed for swelling (Patient not taking: Reported on 12/20/2015) 60 tablet 1   No current facility-administered medications for this visit.    ROS:   General:  No weight loss, Fever, chills  HEENT: No recent headaches, no nasal bleeding, no visual changes, no sore throat  Neurologic: No dizziness, blackouts, seizures. No recent symptoms of stroke or mini- stroke. No recent episodes of slurred speech, or temporary blindness.  Cardiac: No recent episodes of chest pain/pressure, no shortness of breath at rest.  No shortness of breath with exertion.  Denies history of atrial fibrillation or irregular heartbeat  Vascular: No history of rest pain in feet.  No history of claudication.  No history of non-healing ulcer, No history of DVT   Pulmonary: No home oxygen, no productive cough, no hemoptysis,  No asthma or wheezing  Musculoskeletal:  [ ]  Arthritis, [ ]  Low back pain,  [ ]  Joint pain  Hematologic:No history of hypercoagulable state.  No history of easy bleeding.  No history of anemia  Gastrointestinal: No hematochezia or melena,  No gastroesophageal reflux, no trouble swallowing  Urinary: [ ]  chronic Kidney disease, [ ]  on HD - [ ]  MWF or [ ]  TTHS, [ ]  Burning with urination, [ ]  Frequent urination, [ ]  Difficulty urinating;   Skin: No  rashes  Psychological: No history of anxiety,  No history of depression   Physical Examination  Filed Vitals:   12/20/15 1410 12/20/15 1411  BP: 152/95 157/103  Pulse: 79   Height: 5\' 5"  (1.651 m)   Weight: 229 lb (103.874 kg)   SpO2: 92%     Body mass index is 38.11 kg/(m^2).  General:  Alert and oriented, no acute distress HEENT: Normal Neck: No bruit or JVD Pulmonary: Clear to auscultation bilaterally Cardiac: Regular Rate and Rhythm without murmur Abdomen: Soft, non-tender, non-distended, no mass, no scars Skin: No rash,  Large 4-6 mm varicosities left lateral calf , right lateral calf thrombosed varicosities 6-8 mm diameter also in the posterior thigh on the right side just above the popliteal fossa 6-8 mm diameter on palpation Extremity Pulses:  2+ radial, brachial, femoral, dorsalis pedis, posterior tibial pulses bilaterally Musculoskeletal: No deformity or edema  Neurologic: Upper and lower extremity motor 5/5 and symmetric  DATA:   Patient had bilateral lower extremity venous duplex today. This showed evidence of reflux in the   Right and left great saphenous vein as well as the lesser saphenous vein on the right side. Vein diameter in the right side with 6 mm left side very between 6 and 8 mm   ASSESSMENT:   Bilateral lower extremity varicose veins with evidence of superficial venous reflux bilaterally. She does have a component of deep vein reflux but I think she would greatly benefit from laser ablation of her superficial system.  PLAN:   The patient was fitted today for bilateral thigh-high compression stockings 20-30 mmHg. We will give her a trial of conservative therapy with this to see if her symptoms improve. Otherwise we will consider her for laser ablation in 3 months.  [name], MD [facility] [place] Office: [phone] Pager: [phone]

## 2016-01-22 ENCOUNTER — Encounter: Payer: Self-pay | Admitting: Gastroenterology

## 2016-03-25 ENCOUNTER — Ambulatory Visit: Payer: Managed Care, Other (non HMO) | Admitting: Vascular Surgery

## 2016-10-20 ENCOUNTER — Encounter (HOSPITAL_COMMUNITY): Payer: Self-pay

## 2016-10-20 ENCOUNTER — Ambulatory Visit: Payer: Managed Care, Other (non HMO)

## 2016-10-20 ENCOUNTER — Inpatient Hospital Stay (HOSPITAL_COMMUNITY)
Admission: EM | Admit: 2016-10-20 | Discharge: 2016-10-24 | DRG: 286 | Disposition: A | Discharge status: Home / Self Care | Payer: Managed Care, Other (non HMO) | Attending: Internal Medicine | Admitting: Internal Medicine

## 2016-10-20 ENCOUNTER — Ambulatory Visit (INDEPENDENT_AMBULATORY_CARE_PROVIDER_SITE_OTHER): Payer: Managed Care, Other (non HMO) | Admitting: Physician Assistant

## 2016-10-20 ENCOUNTER — Emergency Department (HOSPITAL_COMMUNITY): Payer: Managed Care, Other (non HMO)

## 2016-10-20 VITALS — BP 128/76 | HR 100 | Temp 97.8°F | Resp 20 | Ht 65.0 in | Wt 243.0 lb

## 2016-10-20 DIAGNOSIS — Z888 Allergy status to other drugs, medicaments and biological substances status: Secondary | ICD-10-CM | POA: Diagnosis not present

## 2016-10-20 DIAGNOSIS — Z7982 Long term (current) use of aspirin: Secondary | ICD-10-CM

## 2016-10-20 DIAGNOSIS — I2510 Atherosclerotic heart disease of native coronary artery without angina pectoris: Secondary | ICD-10-CM | POA: Diagnosis present

## 2016-10-20 DIAGNOSIS — R0789 Other chest pain: Secondary | ICD-10-CM

## 2016-10-20 DIAGNOSIS — I5021 Acute systolic (congestive) heart failure: Secondary | ICD-10-CM | POA: Diagnosis present

## 2016-10-20 DIAGNOSIS — Z6841 Body Mass Index (BMI) 40.0 and over, adult: Secondary | ICD-10-CM | POA: Diagnosis not present

## 2016-10-20 DIAGNOSIS — N183 Chronic kidney disease, stage 3 (moderate): Secondary | ICD-10-CM | POA: Diagnosis present

## 2016-10-20 DIAGNOSIS — I4891 Unspecified atrial fibrillation: Secondary | ICD-10-CM | POA: Diagnosis present

## 2016-10-20 DIAGNOSIS — F17210 Nicotine dependence, cigarettes, uncomplicated: Secondary | ICD-10-CM | POA: Diagnosis present

## 2016-10-20 DIAGNOSIS — Z23 Encounter for immunization: Secondary | ICD-10-CM

## 2016-10-20 DIAGNOSIS — Z79899 Other long term (current) drug therapy: Secondary | ICD-10-CM | POA: Diagnosis not present

## 2016-10-20 DIAGNOSIS — R740 Nonspecific elevation of levels of transaminase and lactic acid dehydrogenase [LDH]: Secondary | ICD-10-CM | POA: Diagnosis present

## 2016-10-20 DIAGNOSIS — R748 Abnormal levels of other serum enzymes: Secondary | ICD-10-CM

## 2016-10-20 DIAGNOSIS — E041 Nontoxic single thyroid nodule: Secondary | ICD-10-CM | POA: Diagnosis present

## 2016-10-20 DIAGNOSIS — Z720 Tobacco use: Secondary | ICD-10-CM | POA: Diagnosis present

## 2016-10-20 DIAGNOSIS — I130 Hypertensive heart and chronic kidney disease with heart failure and stage 1 through stage 4 chronic kidney disease, or unspecified chronic kidney disease: Secondary | ICD-10-CM | POA: Diagnosis present

## 2016-10-20 DIAGNOSIS — E669 Obesity, unspecified: Secondary | ICD-10-CM | POA: Diagnosis present

## 2016-10-20 DIAGNOSIS — G4710 Hypersomnia, unspecified: Secondary | ICD-10-CM | POA: Diagnosis present

## 2016-10-20 DIAGNOSIS — N179 Acute kidney failure, unspecified: Secondary | ICD-10-CM

## 2016-10-20 DIAGNOSIS — I509 Heart failure, unspecified: Secondary | ICD-10-CM

## 2016-10-20 DIAGNOSIS — I429 Cardiomyopathy, unspecified: Secondary | ICD-10-CM | POA: Diagnosis present

## 2016-10-20 DIAGNOSIS — I10 Essential (primary) hypertension: Secondary | ICD-10-CM | POA: Diagnosis present

## 2016-10-20 DIAGNOSIS — E785 Hyperlipidemia, unspecified: Secondary | ICD-10-CM | POA: Diagnosis present

## 2016-10-20 DIAGNOSIS — N189 Chronic kidney disease, unspecified: Secondary | ICD-10-CM | POA: Diagnosis present

## 2016-10-20 DIAGNOSIS — Z8249 Family history of ischemic heart disease and other diseases of the circulatory system: Secondary | ICD-10-CM

## 2016-10-20 DIAGNOSIS — I499 Cardiac arrhythmia, unspecified: Secondary | ICD-10-CM | POA: Diagnosis not present

## 2016-10-20 DIAGNOSIS — I428 Other cardiomyopathies: Secondary | ICD-10-CM

## 2016-10-20 DIAGNOSIS — R0602 Shortness of breath: Secondary | ICD-10-CM

## 2016-10-20 DIAGNOSIS — R9431 Abnormal electrocardiogram [ECG] [EKG]: Secondary | ICD-10-CM | POA: Diagnosis not present

## 2016-10-20 HISTORY — DX: Chronic kidney disease, unspecified: N17.9

## 2016-10-20 HISTORY — DX: Atherosclerotic heart disease of native coronary artery without angina pectoris: I25.10

## 2016-10-20 HISTORY — DX: Other cardiomyopathies: I42.8

## 2016-10-20 LAB — CBC WITH DIFFERENTIAL/PLATELET
Basophils Absolute: 0 10*3/uL (ref 0.0–0.1)
Basophils Relative: 0 %
EOS PCT: 1 %
Eosinophils Absolute: 0 10*3/uL (ref 0.0–0.7)
HEMATOCRIT: 41.3 % (ref 36.0–46.0)
Hemoglobin: 13.4 g/dL (ref 12.0–15.0)
LYMPHS PCT: 18 %
Lymphs Abs: 1.4 10*3/uL (ref 0.7–4.0)
MCH: 29.3 pg (ref 26.0–34.0)
MCHC: 32.4 g/dL (ref 30.0–36.0)
MCV: 90.4 fL (ref 78.0–100.0)
MONO ABS: 0.5 10*3/uL (ref 0.1–1.0)
MONOS PCT: 6 %
NEUTROS ABS: 5.9 10*3/uL (ref 1.7–7.7)
Neutrophils Relative %: 75 %
Platelets: 153 10*3/uL (ref 150–400)
RBC: 4.57 MIL/uL (ref 3.87–5.11)
RDW: 14.1 % (ref 11.5–15.5)
WBC: 7.9 10*3/uL (ref 4.0–10.5)

## 2016-10-20 LAB — BASIC METABOLIC PANEL
Anion gap: 8 (ref 5–15)
BUN: 26 mg/dL — AB (ref 6–20)
CALCIUM: 8.8 mg/dL — AB (ref 8.9–10.3)
CO2: 27 mmol/L (ref 22–32)
CREATININE: 1.2 mg/dL — AB (ref 0.44–1.00)
Chloride: 103 mmol/L (ref 101–111)
GFR calc Af Amer: 55 mL/min — ABNORMAL LOW (ref >60)
GFR calc non Af Amer: 47 mL/min — ABNORMAL LOW (ref >60)
GLUCOSE: 95 mg/dL (ref 65–99)
Potassium: 3.7 mmol/L (ref 3.5–5.1)
Sodium: 138 mmol/L (ref 135–145)

## 2016-10-20 LAB — PROTIME-INR
INR: 1.16
Prothrombin Time: 14.8 seconds (ref 11.4–15.2)

## 2016-10-20 LAB — MAGNESIUM
MAGNESIUM: 1.9 mg/dL (ref 1.7–2.4)
Magnesium: 1.9 mg/dL (ref 1.7–2.4)

## 2016-10-20 LAB — HEPATIC FUNCTION PANEL
ALBUMIN: 3.2 g/dL — AB (ref 3.5–5.0)
ALK PHOS: 92 U/L (ref 38–126)
ALT: 113 U/L — AB (ref 14–54)
AST: 69 U/L — ABNORMAL HIGH (ref 15–41)
BILIRUBIN INDIRECT: 1 mg/dL — AB (ref 0.3–0.9)
BILIRUBIN TOTAL: 1.2 mg/dL (ref 0.3–1.2)
Bilirubin, Direct: 0.2 mg/dL (ref 0.1–0.5)
TOTAL PROTEIN: 6.5 g/dL (ref 6.5–8.1)

## 2016-10-20 LAB — TSH: TSH: 1.007 u[IU]/mL (ref 0.350–4.500)

## 2016-10-20 LAB — TROPONIN I
Troponin I: 0.06 ng/mL (ref <0.03)
Troponin I: 0.05 ng/mL (ref <0.03)

## 2016-10-20 LAB — BRAIN NATRIURETIC PEPTIDE: B NATRIURETIC PEPTIDE 5: 998.6 pg/mL — AB (ref 0.0–100.0)

## 2016-10-20 MED ORDER — SODIUM CHLORIDE 0.9% FLUSH: 3.0000 mL | INTRAVENOUS | Status: DC | PRN

## 2016-10-20 MED ORDER — LEVALBUTEROL HCL 0.63 MG/3ML IN NEBU: 0.6300 mg | INHALATION_SOLUTION | Freq: Four times a day (QID) | RESPIRATORY_TRACT | Status: DC | PRN

## 2016-10-20 MED ORDER — ONDANSETRON HCL 4 MG/2ML IJ SOLN: 4.0000 mg | Freq: Four times a day (QID) | INTRAMUSCULAR | Status: DC | PRN

## 2016-10-20 MED ORDER — ASPIRIN 81 MG PO CHEW: 324.0000 mg | CHEWABLE_TABLET | Freq: Once | ORAL | Status: DC

## 2016-10-20 MED ORDER — LEVALBUTEROL HCL 0.63 MG/3ML IN NEBU: 0.6300 mg | INHALATION_SOLUTION | Freq: Three times a day (TID) | RESPIRATORY_TRACT | Status: DC

## 2016-10-20 MED ORDER — DILTIAZEM HCL 100 MG IV SOLR
5.0000 mg/h | INTRAVENOUS | Status: DC
  Administered 2016-10-21: 5 mg/h via INTRAVENOUS
  Filled 2016-10-20: qty 100

## 2016-10-20 MED ORDER — HEPARIN (PORCINE) IN NACL 100-0.45 UNIT/ML-% IJ SOLN
1500.0000 [IU]/h | INTRAMUSCULAR | Status: DC
  Administered 2016-10-20 – 2016-10-21 (×2): 1300 [IU]/h via INTRAVENOUS
  Administered 2016-10-22: 1500 [IU]/h via INTRAVENOUS
  Filled 2016-10-20 (×3): qty 250

## 2016-10-20 MED ORDER — ASPIRIN 81 MG PO CHEW
81.0000 mg | CHEWABLE_TABLET | Freq: Every day | ORAL | Status: DC
  Administered 2016-10-21: 81 mg via ORAL
  Filled 2016-10-20: qty 1

## 2016-10-20 MED ORDER — ROSUVASTATIN CALCIUM 10 MG PO TABS
10.0000 mg | ORAL_TABLET | Freq: Every day | ORAL | Status: DC
  Administered 2016-10-21: 10 mg via ORAL
  Filled 2016-10-20: qty 1

## 2016-10-20 MED ORDER — ACETAMINOPHEN 325 MG PO TABS: 650.0000 mg | ORAL_TABLET | ORAL | Status: DC | PRN

## 2016-10-20 MED ORDER — DILTIAZEM HCL 100 MG IV SOLR
5.0000 mg/h | INTRAVENOUS | Status: DC
  Administered 2016-10-20: 5 mg/h via INTRAVENOUS
  Filled 2016-10-20: qty 100

## 2016-10-20 MED ORDER — SODIUM CHLORIDE 0.9 % IV SOLN: 250.0000 mL | INTRAVENOUS | Status: DC | PRN

## 2016-10-20 MED ORDER — SODIUM CHLORIDE 0.9% FLUSH
3.0000 mL | Freq: Two times a day (BID) | INTRAVENOUS | Status: DC
  Administered 2016-10-20 – 2016-10-23 (×5): 3 mL via INTRAVENOUS

## 2016-10-20 MED ORDER — ASPIRIN 81 MG PO CHEW
324.0000 mg | CHEWABLE_TABLET | Freq: Once | ORAL | Status: AC
  Administered 2016-10-20: 324 mg via ORAL

## 2016-10-20 MED ORDER — FUROSEMIDE 10 MG/ML IJ SOLN
60.0000 mg | Freq: Once | INTRAMUSCULAR | Status: AC
  Administered 2016-10-20: 60 mg via INTRAVENOUS
  Filled 2016-10-20: qty 6

## 2016-10-20 MED ORDER — DILTIAZEM LOAD VIA INFUSION
20.0000 mg | Freq: Once | INTRAVENOUS | Status: AC
  Administered 2016-10-20: 20 mg via INTRAVENOUS
  Filled 2016-10-20: qty 20

## 2016-10-20 MED ORDER — HEPARIN BOLUS VIA INFUSION
4000.0000 [IU] | Freq: Once | INTRAVENOUS | Status: AC
  Administered 2016-10-21: 4000 [IU] via INTRAVENOUS
  Filled 2016-10-20: qty 4000

## 2016-10-20 NOTE — Progress Notes (Signed)
ANTICOAGULATION CONSULT NOTE - Initial Consult  Pharmacy Consult for heparin Indication: atrial fibrillation  Allergies  Allergen Reactions  . Ace Inhibitors Palpitations    Patient Measurements: Height: 5\' 5"  (165.1 cm) Weight: 242 lb 8 oz (110 kg) IBW/kg (Calculated) : 57 Heparin Dosing Weight: 82.9 Kg  Vital Signs: Temp: 98.9 F (37.2 C) (11/27 2114) Temp Source: Oral (11/27 2114) BP: 117/92 (11/27 2114) Pulse Rate: 109 (11/27 2114)  Labs:  Recent Labs  10/20/16 1731  HGB 13.4  HCT 41.3  PLT 153  LABPROT 14.8  INR 1.16  CREATININE 1.20*  TROPONINI 0.06*   Estimated Creatinine Clearance: 59.2 mL/min (by C-G formula based on SCr of 1.2 mg/dL (H)).  Medical History: Past Medical History:  Diagnosis Date  . Arthritis   . DIVERTICULITIS, HX OF 07/25/2007  . DIVERTICULOSIS, COLON 07/22/2007  . Edema, peripheral    a. chronic BLE edema, R>L. Prior trauma from dog attack and accident.  Marland Kitchen HYPERLIPIDEMIA 02/03/2008  . Hypersomnia    declines w/u  . Hypertension   . MENOPAUSAL DISORDER 01/09/2011  . Morbid obesity ([place]) 07/22/2007  . Raynaud's syndrome 07/22/2007  . THYROID NODULE, RIGHT 01/04/2010  . VITAMIN D DEFICIENCY 01/09/2011   Qualifier: Diagnosis of  By: [name], [name]    Medications:  Prescriptions Prior to Admission  Medication Sig Dispense Refill Last Dose  . triamterene-hydrochlorothiazide (MAXZIDE) 75-50 MG tablet Take 1 tablet by mouth daily. 30 tablet 11 10/20/2016 at Unknown time  . aspirin 81 MG EC tablet Take 1 tablet (81 mg total) by mouth daily. Swallow whole. (Patient not taking: Reported on 10/20/2016) 30 tablet 12 Not Taking at Unknown time  . furosemide (LASIX) 20 MG tablet 1 tab by mouth twice per wk as needed for swelling (Patient not taking: Reported on 10/20/2016) 60 tablet 1 Not Taking at Unknown time  . rosuvastatin (CRESTOR) 10 MG tablet Take 1 tablet (10 mg total) by mouth daily. (Patient not taking: Reported on 10/20/2016) 90 tablet 3  Not Taking at Unknown time   Assessment: Patricia Wagner is a 65 y.o. female with new onset atrial fibrillation with RVR not on anticoagulation PTA. Patient to begin heparin infusion while troponins are cycled. CBC is stable WNL.   Goal of Therapy:  Heparin level 0.3-0.7 units/ml Monitor platelets by anticoagulation protocol: Yes   Plan:  Give 4000 units bolus x 1 Start heparin infusion at 1300 units/hr Check anti-Xa level in 6 hours and daily while on heparin Continue to monitor H&H and platelets  [name] [name] 10/20/2016,9:23 PM

## 2016-10-20 NOTE — Patient Instructions (Addendum)
Pt sent via EMS to ED for further evaluation.   IF you received an x-ray today, you will receive an invoice from [facility]. Please contact [facility] at [phone] with questions or concerns regarding your invoice.   IF you received labwork today, you will receive an invoice from [employer]. Please contact [facility] at [phone] with questions or concerns regarding your invoice.   Our billing staff will not be able to assist you with questions regarding bills from these companies.  You will be contacted with the lab results as soon as they are available. The fastest way to get your results is to activate your My Chart account. Instructions are located on the last page of this paperwork. If you have not heard from [place] regarding the results in 2 weeks, please contact this office.

## 2016-10-20 NOTE — H&P (Addendum)
Cardiology History & Physical    Patient ID: MAURYA NETHERY MRN: [id number], DOB: [birth year] Date of Encounter: 10/20/2016, 7:43 PM Primary Physician: Cathlean Cower, MD Primary Cardiologist: New to Dr. Harrington Challenger  Chief Complaint: dyspnea on exertion Reason for Admission: new onset atrial fib RVR Requesting MD: Dr Jeneen Rinks  HPI: NEVAYAH FAUST is a 65 y.o. female with history of arthritis, chronic BLE edema (R>L, following dog attack and ortho injuries), HTN, obesity, 50 year tobacco history, hyperlipidemia, and probable mild CKD who presented to [facility] with 1 day history of exertional DOE. Other history was populated into her chart below but the patient states she was unaware of these diagnoses. She felt fine over the weekend, able to do activities as normal. She works as a Glass blower/designer and has usually been able to perform her job without limitation. Last night she noticed she had a dry cough so took some Robitussin to help mobilize the chest congestion. This morning she noticed while at work every time she went to walk around she got noticeably SOB. This was very unusual for her but kept occurring even with slight activity. As a result she went to urgent care where she was noted to be in rapid atrial fib RVR 150's. She was subsequently txf to [facility] where she received 200mg  IV diltiazem bolus and subsequent gtt with HR improved to 90s-100. She presently denies SOB at rest but has not been up moving around again. No chest pain, nausea, vomiting, diaphoresis, bleeding, syncope, or palpitations - states, "I only could notice how short of breath I was." CXR shows cardiomegaly and interstitial edema. Labs show Cr 1.2 (c/w prior), K 3.7, Hgb 13.4, troponin 0.06, TSH wnl. She reports chronic edema actually looks better than usual. Reports 50 year tobacco hx ("I quit today"), occasional social EtOH without regularity, denies illicit drug use. + Fam hx CAD in father, presumed MI in his 5s. Twin sister has had some sort of  heart issue for years but she's not sure. No recent travel, surgery, bedrest, or family history/personal history of clot.  Past Medical History:  Diagnosis Date  . Arthritis   . DIVERTICULITIS, HX OF 07/25/2007  . DIVERTICULOSIS, COLON 07/22/2007  . Edema, peripheral    a. chronic BLE edema, R>L. Prior trauma from dog attack and accident.  Marland Kitchen HYPERLIPIDEMIA 02/03/2008  . Hypersomnia    declines w/u  . Hypertension   . MENOPAUSAL DISORDER 01/09/2011  . Morbid obesity ([place]) 07/22/2007  . Raynaud's syndrome 07/22/2007  . THYROID NODULE, RIGHT 01/04/2010  . VITAMIN D DEFICIENCY 01/09/2011   Qualifier: Diagnosis of  By: [name], [name]      Surgical History:  Past Surgical History:  Procedure Laterality Date  . CHOLECYSTECTOMY    . COLONOSCOPY    . PARTIAL HYSTERECTOMY     1 OVARY LEFT  . POLYPECTOMY       Home Meds: Prior to Admission medications   Medication Sig Start Date End Date Taking? Authorizing Provider  triamterene-hydrochlorothiazide (MAXZIDE) 75-50 MG tablet Take 1 tablet by mouth daily. 10/25/15  Yes Biagio Borg, MD  aspirin 81 MG EC tablet Take 1 tablet (81 mg total) by mouth daily. Swallow whole. Patient not taking: Reported on 10/20/2016 05/03/13   Biagio Borg, MD  furosemide (LASIX) 20 MG tablet 1 tab by mouth twice per wk as needed for swelling Patient not taking: Reported on 10/20/2016 10/25/15   Biagio Borg, MD  rosuvastatin (CRESTOR) 10 MG tablet  Take 1 tablet (10 mg total) by mouth daily. Patient not taking: Reported on 10/20/2016 10/26/15   Biagio Borg, MD    Allergies:  Allergies  Allergen Reactions  . Ace Inhibitors Palpitations    Social History   Social History  . Marital status: Single    Spouse name: N/A  . Number of children: 1  . Years of education: N/A   Occupational History  . Printing     Social History Main Topics  . Smoking status: Current Every Day Smoker    Packs/day: 0.50    Types: Cigarettes  . Smokeless tobacco: Never Used      Comment: Smoked for 50 years  . Alcohol use 2.4 oz/week    4 Cans of beer per week     Comment: Intermittent  . Drug use: No  . Sexual activity: Not on file   Other Topics Concern  . Not on file   Social History Narrative  . No narrative on file     Family History  Problem Relation Age of Onset  . Asthma Mother   . Diabetes Father   . Heart disease Father     Died of presumed heart attack - 32s  . Lung disease Sister   . Heart disease Sister     Twin sister has heart issue, unclear what kind  . Thyroid disease Neg Hx     Review of Systems: All other systems reviewed and are otherwise negative except as noted above.  Labs:   Lab Results  Component Value Date   WBC 7.9 10/20/2016   HGB 13.4 10/20/2016   HCT 41.3 10/20/2016   MCV 90.4 10/20/2016   PLT 153 10/20/2016    Recent Labs Lab 10/20/16 1731  NA 138  K 3.7  CL 103  CO2 27  BUN 26*  CREATININE 1.20*  CALCIUM 8.8*  GLUCOSE 95    Recent Labs  10/20/16 1731  TROPONINI 0.06*   Lab Results  Component Value Date   CHOL 217 (H) 10/25/2015   HDL 63.40 10/25/2015   LDLCALC 137 (H) 10/25/2015   TRIG 80.0 10/25/2015   No results found for: DDIMER  Radiology/Studies:  Dg Chest Port 1 View  Result Date: 10/20/2016 CLINICAL DATA:  Atrial fibrillation and shortness of breath. EXAM: PORTABLE CHEST 1 VIEW COMPARISON:  05/04/2008 FINDINGS: The heart is enlarged. There is aortic atherosclerosis. There is pulmonary venous hypertension with early interstitial edema. No focal pulmonary lesion. No acute bone finding. IMPRESSION: Cardiomegaly and interstitial edema. Electronically Signed   By: Nelson Chimes M.D.   On: 10/20/2016 19:27   Wt Readings from Last 3 Encounters:  10/20/16 243 lb (110.2 kg)  12/20/15 229 lb (103.9 kg)  10/25/15 236 lb (107 kg)    EKG: atrial fib nonspecific ST-T changes  Physical Exam: Blood pressure 107/87, pulse 93, resp. rate 23, SpO2 93 %. There is no height or weight on file  to calculate BMI. General: Well developed, well nourished obese AAF, in no acute distress. Head: Normocephalic, atraumatic, sclera non-icteric, no xanthomas, nares are without discharge.  Neck: Negative for carotid bruits. JVD mildly elevated. Lungs: Slightly coarse with diminished BS, no wheezes or rhonchi. Breathing is unlabored. Heart: RRR with S1 S2. No murmurs, rubs, or gallops appreciated. Abdomen: Soft, non-tender, non-distended with normoactive bowel sounds. No hepatomegaly. No rebound/guarding. No obvious abdominal masses. Msk:  Strength and tone appear normal for age. Extremities: No clubbing or cyanosis. Trace-1+ puffy soft nonpitting BLE edema.  Distal  pedal pulses are 2+ and equal bilaterally. Neuro: Alert and oriented X 3. No focal deficit. No facial asymmetry. Moves all extremities spontaneously. Psych:  Responds to questions appropriately with a normal affect.    Assessment and Plan  65F with arthritis, chronic BLE edema (R>L, following dog attack and ortho injuries), HTN, obesity, 50 year tobacco history and probable mild CKD who presented to [facility] with DOE, found to be in atrial fib RVR and probable acute CHF.  1. Atrial fib RVR - unclear duration  She denies palpitatiosn  . Her dyspnea only started this AM but sensation of chest and cough/congestion last night raise suspicion for development of CHF starting at least before today. Would recommend to admit, continue IV diltiazem at current rate only, and anticoagulate with heparin per pharmacy while troponins are cycled. Will need to follow these to determine if ischemic workup is needed prior to committing to [facility]. Check 2D echo. Thyroid function wnl. CHADSVASC tentatively 3 for female, HTN, CHF. If she remains symptomatic or if rates difficuot to control consider TEE / cardioversion  2. Elevated troponin - as above. ASA given at urgent care. Will continue while troponins are cycled.   Patient denies CP    3. Mild acute CHF,  unknown if diastolic or systolic - will give 1 dose of IV Lasix in ED in follow. Output/ and creatinine  Check echo. BNP just returned at 998 further supporting dx.  4. CKD stage III - Cr appears at baseline. Follow with diuresis.  5. HTN - hold triamterene/HCTZ to make way for IV diuresis and rate control.  6. Hyperlipidemia - check lipids. Continue statin.  Pt with evid of atherosclerosis on xray    7. Tobacco abuse  - importance of cessation reinforced.  Pt says shes quitting  Decreased airflow on forced expiration and some hyperinflation on xray sugg COPD   Signed, Charlie Pitter PA-C 10/20/2016, 7:43 PM Pager: [phone]   Pt seen and examined  I agree with findings as noted by D Dunn  I have amended note to reflect my findings 65 yo long history of tob use  Also HTn   Presents with SOB  Found to be in afib with RVR  On exam JVP is increased  Lungs with decreased airflow  Cardiac exam:  Irreg irreg  NO S3  Ext with no signif edema  CXR with cardiomegaly  BNP elevated at over 900  WIll admit  Rate control  DIurese  Cycle enzymes  Echo.  Further eval/rx based on response and findings of tests.  [name]

## 2016-10-20 NOTE — ED Notes (Signed)
Cardiology to change Cardizem to PO

## 2016-10-20 NOTE — Progress Notes (Signed)
MRN: [id number] DOB: 1951/09/02  Subjective:   Patricia Wagner is a 65 y.o. female presenting for chief complaint of Shortness of Breath (started yesterday, felt sleepy and short of breath , chest congestion, chest sorenes, took robitussin this morning ) . Reports sudden onset of SOB this morning as she was walking into work from her car. Had associated chest tightness, dry cough, pleuritic chest pain and diaphoresis . Denies recent illness, exertional chest pain, nausea, adizziness, fever, chills, recent travel, and  orthopnea.Has tried robitussin with mild relief. Has not had any sick contact. No history of seasonal allergies, asthma, COPD, or heart failure.  Pt quit smoking today. Prior to today, she smoked one pack a day 40 years. She is social alcohol drinker.  Of note, pt does note she has lower leg swelling always and takes lasix for it daily. Her right leg has always been bigger. She denies any recent immobilization, lower leg pain, warmth, or redness.   She has no PMH of heart disease, atrial fibrillation, or clots. FH of MI in father in his 80s.    Patricia Wagner has a current medication list which includes the following prescription(s): triamterene-hydrochlorothiazide, aspirin, furosemide, and rosuvastatin. Also is allergic to ace inhibitors.  Patricia Wagner  has a past medical history of Arthritis; DIVERTICULITIS, HX OF (07/25/2007); DIVERTICULOSIS, COLON (07/22/2007); Edema, peripheral; HYPERLIPIDEMIA (02/03/2008); Hypersomnia; Hypertension; HYPERTENSION (07/22/2007); LEG CRAMPS, NOCTURNAL (02/03/2008); LOW BACK PAIN (07/25/2007); MENOPAUSAL DISORDER (01/09/2011); Morbid obesity ([place]) (07/22/2007); Raynaud's syndrome (07/22/2007); THYROID NODULE, RIGHT (01/04/2010); URI (02/02/2009); and VITAMIN D DEFICIENCY (01/09/2011). Also  has a past surgical history that includes Partial hysterectomy; Colonoscopy; Polypectomy; and Cholecystectomy.   Objective:   Vitals: BP 128/76 (BP Location: Right Arm, Patient Position:  Sitting, Cuff Size: Large)   Pulse 100   Temp 97.8 F (36.6 C) (Oral)   Resp 20   Ht 5\' 5"  (1.651 m)   Wt 243 lb (110.2 kg)   SpO2 97%   BMI 40.44 kg/m   Physical Exam  Constitutional: She is oriented to person, place, and time. She appears well-developed and well-nourished. She appears distressed (moderate).  HENT:  Head: Normocephalic and atraumatic.  Nose: Rhinorrhea present.  Mouth/Throat: Uvula is midline, oropharynx is clear and moist and mucous membranes are normal.  Eyes: Conjunctivae are normal.  Neck: Normal range of motion.  Cardiovascular: Normal pulses.  An irregular rhythm present. Tachycardia present.   Pulmonary/Chest: Effort normal and breath sounds normal. Tachypnea noted. She has no wheezes. She has no rhonchi. She has no rales.  Musculoskeletal:       Right lower leg: She exhibits edema ( 1+).       Left lower leg: She exhibits edema ( 1+).  Right calf measures 44 cm. Left calf measures 41 cm.   Negative Homan's sign.   Neurological: She is alert and oriented to person, place, and time.  Skin: Skin is warm and dry.  Psychiatric: She has a normal mood and affect.  Vitals reviewed.  No results found for this or any previous visit (from the past 24 hour(s)).   EKG shows irregularly irregular rhythm with tachycardia of 127 bpm. Findings presented to Dr. Carlota Raspberry.    Assessment and Plan :  This case was precepted with Dr. Carlota Raspberry.   Due to new onset atrial fibrillation as seen on EKG and patient's symptoms, EMS contacted and she was transferred to [facility] ED emergently for further evaluation. Spoke with ER charge nurse, Lenna Sciara, and informed her of patient's transfer.  1. SOB (shortness of breath) - EKG 12-Lead 2. Chest tightness - aspirin chewable tablet 324 mg; Chew 4 tablets (324 mg total) by mouth once. 3. Abnormal EKG 4. Irregular heart rate  Patricia Delaine, PA-C  [facility] 10/20/2016 3:31  PM

## 2016-10-20 NOTE — ED Notes (Signed)
Heart healthy diet ordered, EDP also at bedside

## 2016-10-20 NOTE — ED Provider Notes (Signed)
[facility] DEPT Provider Note   CSN: [id number] Arrival date & time: 10/20/16  1708     History   Chief Complaint Chief Complaint  Patient presents with  . Atrial Fibrillation    HPI Patricia Wagner is a 65 y.o. female. Presents here with new onset atrial fibrillation  HPI: Patient describes feeling in her normal state of health until this morning. She was walking up a walkway to her work. She states that she felt short of breath and "winded". She describes "tightness" in her chest. However, she describes as "more like there something in there you need to cough out". Denies pain or true pressure. No pleuritic discomfort. Symptoms persisted through the day. She developed cough around noon and had her daughter bring her some plain Robitussin. Was feeling more winded even at rest at her job and went to urgent care. Was found to be in rapid A. fib and was referred here. Given aspirin at urgent care  Past Medical History:  Diagnosis Date  . Arthritis   . DIVERTICULITIS, HX OF 07/25/2007  . DIVERTICULOSIS, COLON 07/22/2007  . Edema, peripheral    chronic  . HYPERLIPIDEMIA 02/03/2008  . Hypersomnia    declines w/u  . Hypertension   . MENOPAUSAL DISORDER 01/09/2011  . Morbid obesity ([place]) 07/22/2007  . Raynaud's syndrome 07/22/2007  . THYROID NODULE, RIGHT 01/04/2010  . VITAMIN D DEFICIENCY 01/09/2011   Qualifier: Diagnosis of  By: [name], [name]     Patient Active Problem List   Diagnosis Date Noted  . Atrial fibrillation with rapid ventricular response ([place]) 10/20/2016  . Hematochezia 06/29/2013  . Degenerative arthritis of left knee 03/15/2012  . Preventative health care 03/12/2012  . Hypersomnia   . VITAMIN D DEFICIENCY 01/09/2011  . MENOPAUSAL DISORDER 01/09/2011  . THYROID NODULE, RIGHT 01/04/2010  . HYPERLIPIDEMIA 02/03/2008  . LEG CRAMPS, NOCTURNAL 02/03/2008  . LOW BACK PAIN 07/25/2007  . DIVERTICULITIS, HX OF 07/25/2007  . Morbid obesity ([place]) 07/22/2007  .  Essential hypertension 07/22/2007  . Raynaud's syndrome 07/22/2007  . DIVERTICULOSIS, COLON 07/22/2007  . SYMPTOM, EDEMA 07/22/2007    Past Surgical History:  Procedure Laterality Date  . CHOLECYSTECTOMY    . COLONOSCOPY    . PARTIAL HYSTERECTOMY     1 OVARY LEFT  . POLYPECTOMY      OB History    No data available       Home Medications    Prior to Admission medications   Medication Sig Start Date End Date Taking? Authorizing Provider  triamterene-hydrochlorothiazide (MAXZIDE) 75-50 MG tablet Take 1 tablet by mouth daily. 10/25/15  Yes Biagio Borg, MD  aspirin 81 MG EC tablet Take 1 tablet (81 mg total) by mouth daily. Swallow whole. Patient not taking: Reported on 10/20/2016 05/03/13   Biagio Borg, MD  furosemide (LASIX) 20 MG tablet 1 tab by mouth twice per wk as needed for swelling Patient not taking: Reported on 10/20/2016 10/25/15   Biagio Borg, MD  rosuvastatin (CRESTOR) 10 MG tablet Take 1 tablet (10 mg total) by mouth daily. Patient not taking: Reported on 10/20/2016 10/26/15   Biagio Borg, MD    Family History Family History  Problem Relation Age of Onset  . Asthma Mother   . Diabetes Father   . Heart disease Father     Died of presumed heart attack - 67s  . Lung disease Sister   . Heart disease Sister     Twin sister has heart  issue, unclear what kind  . Thyroid disease Neg Hx     Social History Social History  Substance Use Topics  . Smoking status: Current Every Day Smoker    Packs/day: 0.50    Types: Cigarettes  . Smokeless tobacco: Never Used     Comment: Smoked for 50 years  . Alcohol use 2.4 oz/week    4 Cans of beer per week     Comment: Intermittent     Allergies   Ace inhibitors   Review of Systems Review of Systems  Constitutional: Positive for fatigue. Negative for appetite change, chills, diaphoresis and fever.  HENT: Negative for mouth sores, sore throat and trouble swallowing.   Eyes: Negative for visual disturbance.    Respiratory: Positive for cough, chest tightness and shortness of breath. Negative for wheezing.   Cardiovascular: Positive for leg swelling. Negative for chest pain.  Gastrointestinal: Negative for abdominal distention, abdominal pain, diarrhea, nausea and vomiting.  Endocrine: Negative for polydipsia, polyphagia and polyuria.  Genitourinary: Negative for dysuria, frequency and hematuria.  Musculoskeletal: Negative for gait problem.  Skin: Negative for color change, pallor and rash.  Neurological: Negative for dizziness, syncope, light-headedness and headaches.  Hematological: Does not bruise/bleed easily.  Psychiatric/Behavioral: Negative for behavioral problems and confusion.     Physical Exam Updated Vital Signs BP 107/87   Pulse 93   Resp 23   SpO2 93%   Physical Exam  Constitutional: She is oriented to person, place, and time. She appears well-developed and well-nourished. No distress.  65 year old female. Appears her stated age. Not dyspneic at rest or with conversation.  HENT:  Head: Normocephalic.  Eyes: Conjunctivae are normal. Pupils are equal, round, and reactive to light. No scleral icterus.  Neck: Normal range of motion. Neck supple. No thyromegaly present.  No JVD  Cardiovascular: An irregularly irregular rhythm present. Tachycardia present.  Exam reveals no gallop and no friction rub.   No murmur heard. A. fib on monitor. No S3 or 4 gallop. No crackles in the lungs. Trace symmetric bilateral lower extremity edema  Pulmonary/Chest: Effort normal and breath sounds normal. No respiratory distress. She has no wheezes. She has no rales.  Abdominal: Soft. Bowel sounds are normal. She exhibits no distension. There is no tenderness. There is no rebound.  Musculoskeletal: Normal range of motion.  Neurological: She is alert and oriented to person, place, and time.  Skin: Skin is warm and dry. No rash noted.  2+extremity edema  Psychiatric: She has a normal mood and  affect. Her behavior is normal.     ED Treatments / Results  Labs (all labs ordered are listed, but only abnormal results are displayed) Labs Reviewed  BASIC METABOLIC PANEL - Abnormal; Notable for the following:       Result Value   BUN 26 (*)    Creatinine, Ser 1.20 (*)    Calcium 8.8 (*)    GFR calc non Af Amer 47 (*)    GFR calc Af Amer 55 (*)    All other components within normal limits  TROPONIN I - Abnormal; Notable for the following:    Troponin I 0.06 (*)    All other components within normal limits  CBC WITH DIFFERENTIAL/PLATELET  TSH  MAGNESIUM  PROTIME-INR  BRAIN NATRIURETIC PEPTIDE    EKG  EKG Interpretation  Date/Time:  Monday October 20 2016 17:09:58 EST Ventricular Rate:  120 PR Interval:    QRS Duration: 103 QT Interval:  333 QTC Calculation: 471 R Axis:  76 Text Interpretation:  Atrial fibrillation Paired ventricular premature complexes Nonspecific T abnormalities, lateral leads Confirmed by Jeneen Rinks  MD, [place] ([id number]) on 10/20/2016 5:23:35 PM       Radiology Dg Chest Port 1 View  Result Date: 10/20/2016 CLINICAL DATA:  Atrial fibrillation and shortness of breath. EXAM: PORTABLE CHEST 1 VIEW COMPARISON:  05/04/2008 FINDINGS: The heart is enlarged. There is aortic atherosclerosis. There is pulmonary venous hypertension with early interstitial edema. No focal pulmonary lesion. No acute bone finding. IMPRESSION: Cardiomegaly and interstitial edema. Electronically Signed   By: Nelson Chimes M.D.   On: 10/20/2016 19:27    Procedures Procedures (including critical care time)  Medications Ordered in ED Medications  diltiazem (CARDIZEM) 1 mg/mL load via infusion 20 mg (20 mg Intravenous Bolus from Bag 10/20/16 1801)    And  diltiazem (CARDIZEM) 100 mg in dextrose 5 % 100 mL (1 mg/mL) infusion (5 mg/hr Intravenous New Bag/Given 10/20/16 1800)  aspirin chewable tablet 324 mg (324 mg Oral Not Given 10/20/16 1807)     Initial Impression / Assessment and  Plan / ED Course  I have reviewed the triage vital signs and the nursing notes.  Pertinent labs & imaging results that were available during my care of the patient were reviewed by me and considered in my medical decision making (see chart for details).  Clinical Course     A. fib with RVR. Chads Vasc2 score of 2. One for age, 56 for hypertension. ATRIA (warfarin bleeding risk score of 1 (0.76)) . Plan rate control. Clinically not in congestive heart failure. Plan serial enzymes, has been given aspirin. Await x-ray, labs, EKG. Will reassess.  19:34:  Pt rate controlled  On Cardizem at 5mg /hr. Clinically not in CHF. CXR pending.  D/W Dr. [name] of cardiology. Patient be admitted to Cardiology service.  Final Clinical Impressions(s) / ED Diagnoses   Final diagnoses:  Atrial fibrillation with RVR ([place])   CRITICAL CARE Performed by: Tanna Furry JOSEPH   Total critical care time: 45 minutes  Critical care time was exclusive of separately billable procedures and treating other patients.  Critical care was necessary to treat or prevent imminent or life-threatening deterioration.  Critical care was time spent personally by me on the following activities: development of treatment plan with patient and/or surrogate as well as nursing, discussions with consultants, evaluation of patient's response to treatment, examination of patient, obtaining history from patient or surrogate, ordering and performing treatments and interventions, ordering and review of laboratory studies, ordering and review of radiographic studies, pulse oximetry and re-evaluation of patient's condition.    New Prescriptions New Prescriptions   No medications on file     Tanna Furry, MD 10/20/16 [phone]

## 2016-10-20 NOTE — ED Notes (Signed)
Cardiology at bedside.

## 2016-10-20 NOTE — ED Triage Notes (Signed)
Patient comes from urgent care for A Fib, no history of A Fib per patient.  Was walking to work and got short of breath and had chest pain.  Urgent care started 1000 ml bolus, stopped per MD until x Ray.  A&Ox4.  ASA given at urgent care

## 2016-10-21 ENCOUNTER — Other Ambulatory Visit: Payer: Self-pay | Admitting: Internal Medicine

## 2016-10-21 ENCOUNTER — Inpatient Hospital Stay (HOSPITAL_COMMUNITY): Payer: Managed Care, Other (non HMO)

## 2016-10-21 DIAGNOSIS — I4891 Unspecified atrial fibrillation: Secondary | ICD-10-CM

## 2016-10-21 LAB — CBC
HEMATOCRIT: 41.4 % (ref 36.0–46.0)
Hemoglobin: 13.5 g/dL (ref 12.0–15.0)
MCH: 29.5 pg (ref 26.0–34.0)
MCHC: 32.6 g/dL (ref 30.0–36.0)
MCV: 90.6 fL (ref 78.0–100.0)
PLATELETS: 158 10*3/uL (ref 150–400)
RBC: 4.57 MIL/uL (ref 3.87–5.11)
RDW: 13.9 % (ref 11.5–15.5)
WBC: 7.5 10*3/uL (ref 4.0–10.5)

## 2016-10-21 LAB — LIPID PANEL
Cholesterol: 153 mg/dL (ref 0–200)
HDL: 46 mg/dL (ref >40)
LDL CALC: 95 mg/dL (ref 0–99)
Total CHOL/HDL Ratio: 3.3 RATIO
Triglycerides: 58 mg/dL (ref <150)
VLDL: 12 mg/dL (ref 0–40)

## 2016-10-21 LAB — TROPONIN I
TROPONIN I: 0.05 ng/mL — AB (ref <0.03)
Troponin I: 0.04 ng/mL (ref <0.03)

## 2016-10-21 LAB — BASIC METABOLIC PANEL
ANION GAP: 10 (ref 5–15)
BUN: 28 mg/dL — ABNORMAL HIGH (ref 6–20)
CALCIUM: 9 mg/dL (ref 8.9–10.3)
CHLORIDE: 101 mmol/L (ref 101–111)
CO2: 28 mmol/L (ref 22–32)
Creatinine, Ser: 1.22 mg/dL — ABNORMAL HIGH (ref 0.44–1.00)
GFR calc non Af Amer: 46 mL/min — ABNORMAL LOW (ref >60)
GFR, EST AFRICAN AMERICAN: 53 mL/min — AB (ref >60)
GLUCOSE: 104 mg/dL — AB (ref 65–99)
POTASSIUM: 3.1 mmol/L — AB (ref 3.5–5.1)
Sodium: 139 mmol/L (ref 135–145)

## 2016-10-21 LAB — HEPARIN LEVEL (UNFRACTIONATED)
HEPARIN UNFRACTIONATED: 0.38 [IU]/mL (ref 0.30–0.70)
Heparin Unfractionated: 0.44 IU/mL (ref 0.30–0.70)

## 2016-10-21 MED ORDER — POTASSIUM CHLORIDE CRYS ER 20 MEQ PO TBCR
40.0000 meq | EXTENDED_RELEASE_TABLET | Freq: Once | ORAL | Status: AC
  Administered 2016-10-21: 40 meq via ORAL
  Filled 2016-10-21: qty 2

## 2016-10-21 MED ORDER — FUROSEMIDE 10 MG/ML IJ SOLN
40.0000 mg | Freq: Once | INTRAMUSCULAR | Status: AC
  Administered 2016-10-21: 40 mg via INTRAVENOUS
  Filled 2016-10-21: qty 4

## 2016-10-21 MED ORDER — DILTIAZEM HCL 60 MG PO TABS
60.0000 mg | ORAL_TABLET | Freq: Three times a day (TID) | ORAL | Status: DC
  Administered 2016-10-21 – 2016-10-22 (×3): 60 mg via ORAL
  Filled 2016-10-21 (×3): qty 1

## 2016-10-21 NOTE — Progress Notes (Signed)
  Echocardiogram 2D Echocardiogram has been performed.  Lab[place]ford Schnitker L Androw 10/21/2016, 3:55 PM

## 2016-10-21 NOTE — Progress Notes (Addendum)
Patient Name: Patricia Wagner Date of Encounter: 10/21/2016  Primary Cardiologist: New to Dr. Ladonna Snide Problem List     Principal Problem:   Atrial fibrillation with rapid ventricular response [facility]) Active Problems:   Essential hypertension   Tobacco abuse   Obesity   CKD (chronic kidney disease), stage III   Acute CHF (congestive heart failure) (HCC)   Atrial fibrillation with RVR (HCC)   Subjective   Denies chest pain or dyspnea.   Inpatient Medications    Scheduled Meds: . aspirin  81 mg Oral Daily  . rosuvastatin  10 mg Oral Daily  . sodium chloride flush  3 mL Intravenous Q12H   Continuous Infusions: . diltiazem (CARDIZEM) infusion 5 mg/hr (10/21/16 0440)  . heparin 1,300 Units/hr (10/20/16 2354)   PRN Meds: sodium chloride, acetaminophen, levalbuterol, ondansetron (ZOFRAN) IV, sodium chloride flush   Vital Signs    Vitals:   10/20/16 2000 10/20/16 2015 10/20/16 2114 10/21/16 0556  BP: 114/85 113/81 (!) 117/92 (!) 119/99  Pulse: 85 (!) 59 (!) 109 96  Resp: (!) 28 20 18 20   Temp:   98.9 F (37.2 C) 98.6 F (37 C)  TempSrc:   Oral Oral  SpO2: 97% 94%  90%  Weight:   242 lb 8 oz (110 kg) 243 lb 12.8 oz (110.6 kg)  Height:   5\' 5"  (1.651 m)    No intake or output data in the 24 hours ending 10/21/16 0910 Filed Weights   10/20/16 2114 10/21/16 0556  Weight: 242 lb 8 oz (110 kg) 243 lb 12.8 oz (110.6 kg)    Physical Exam    GEN: Well nourished, well developed, in no acute distress.  HEENT: Grossly normal.  Neck: Supple, + JVD, carotid bruits, or masses. Cardiac: Ir IR tachycardia, no murmurs, rubs, or gallops. No clubbing, cyanosis. 1 + BL LE edema.  Radials/DP/PT 2+ and equal bilaterally.  Respiratory:  Respirations regular and unlabored, diminished breath sounds GI: Soft, nontender, nondistended, BS + x 4. MS: no deformity or atrophy. Skin: warm and dry, no rash. Neuro:  Strength and sensation are intact. Psych: AAOx3.  Normal affect.  Labs     CBC  Recent Labs  10/20/16 1731 10/21/16 0307  WBC 7.9 7.5  NEUTROABS 5.9  --   HGB 13.4 13.5  HCT 41.3 41.4  MCV 90.4 90.6  PLT 153 710   Basic Metabolic Panel  Recent Labs  10/20/16 1731 10/20/16 2122 10/21/16 0307  NA 138  --  139  K 3.7  --  3.1*  CL 103  --  101  CO2 27  --  28  GLUCOSE 95  --  104*  BUN 26*  --  28*  CREATININE 1.20*  --  1.22*  CALCIUM 8.8*  --  9.0  MG 1.9 1.9  --    Liver Function Tests  Recent Labs  10/20/16 2122  AST 69*  ALT 113*  ALKPHOS 92  BILITOT 1.2  PROT 6.5  ALBUMIN 3.2*   No results for input(s): LIPASE, AMYLASE in the last 72 hours. Cardiac Enzymes  Recent Labs  10/20/16 1731 10/20/16 2122 10/21/16 0307  TROPONINI 0.06* 0.05* 0.05*   BNP Invalid input(s): POCBNP D-Dimer No results for input(s): DDIMER in the last 72 hours. Hemoglobin A1C No results for input(s): HGBA1C in the last 72 hours. Fasting Lipid Panel  Recent Labs  10/21/16 0307  CHOL 153  HDL 46  LDLCALC 95  TRIG 58  CHOLHDL 3.3  Thyroid Function Tests  Recent Labs  10/20/16 1731  TSH 1.007    Telemetry    afib at rate of 130s - Personally Reviewed  ECG    N/A  Radiology    Dg Chest Port 1 View  Result Date: 10/20/2016 CLINICAL DATA:  Atrial fibrillation and shortness of breath. EXAM: PORTABLE CHEST 1 VIEW COMPARISON:  05/04/2008 FINDINGS: The heart is enlarged. There is aortic atherosclerosis. There is pulmonary venous hypertension with early interstitial edema. No focal pulmonary lesion. No acute bone finding. IMPRESSION: Cardiomegaly and interstitial edema. Electronically Signed   By: Nelson Chimes M.D.   On: 10/20/2016 19:27    Cardiac Studies   Pending echo  Patient Profile     48F with arthritis, chronic BLE edema (R>L, following dog attack and ortho injuries), HTN, obesity, 50 year tobacco history and probable mild CKD who presented to [facility] with DOE, found to be in atrial fib RVR and probable acute  CHF.  Assessment & Plan     1. Atrial fib RVR  - Unknown duration  She denies palpitations.TSH normal. Pending echo. Her rate was in 90s last night with intermittent elevation. Rate sustaining in 120-130 since 6am this morning with ambulation in room. Will up-titrate IV cardizem to 7.17ml/hour. BP stable.  CHADSVASC tentatively 3 for female, HTN, CHF. Continue IV heparin for anticoagulation with plan to change to NOAC if no ischemic work up needed this admission.  If she remains symptomatic or if rates difficuot to control consider TEE / cardioversion.   2. Elevated troponin - - Flat trend. Likely due to elevated rate and acute CKF.   3. Acute CHF, systolic  - Given1 dose of IV Lasix in ED in follow.  BNP 998. I & O has not been recorded. Will get Strict I & O and daily weight. Pending echo.  - She is volume overloaded on exam. Will give additional IV lasix 40mg  x 1. Further management per MD. Scr stable.  - Consider adding BB and ACE/ARB pending echo.   4. CKD stage III - Cr appears at baseline. Follow with diuresis.  5. HTN - hold triamterene/HCTZ to make way for IV diuresis and rate control.  6. Hyperlipidemia  - 10/21/2016: Cholesterol 153; HDL 46; LDL Cholesterol 95; Triglycerides 58; VLDL 12  -  Continue statin.  Pt with evid of atherosclerosis on xray.   7. Tobacco abuse   - Advised cessation. Decreased airflow on forced expiration and some hyperinflation on xray sugg COPD    Signed, Bhagat,Bhavinkumar, PA  10/21/2016, 9:10 AM   As above, patient seen and examined. She remains dyspneic but improved. No chest pain or palpitations. Her heart rate is elevated. I will discontinue IV Cardizem and treat with 60 mg every 8 hours. Follow heart rate and adjust as needed. Await echocardiogram. If LV function normal would not pursue further ischemia evaluation. Discontinue IV heparin and treat with apixaban. Will give Lasix today and follow renal function. If she continues to have  dyspnea on exertion would need TEE guided cardioversion prior to discharge. Supplement potassium.  Kirk Ruths, MD

## 2016-10-21 NOTE — Progress Notes (Signed)
Paged on call cardiology for clarification of cardizem drip orders. Note from Dr. Harrington Challenger stated that, "keep cardizem at 5", and the orders showed the parameters to increase according to the HR. On call stated that," to keep the rate at 5 and follow Dr. Alan Ripper note." MD aware of patient's HR increasing from 90's to 140's and returning to 90's- 110's. Will continue to monitor.

## 2016-10-21 NOTE — Progress Notes (Signed)
[place] for heparin Indication: atrial fibrillation  Allergies  Allergen Reactions  . Ace Inhibitors Palpitations    Patient Measurements: Height: 5\' 5"  (165.1 cm) Weight: 242 lb 8 oz (110 kg) IBW/kg (Calculated) : 57 Heparin Dosing Weight: 82.9 Kg  Vital Signs: Temp: 98.9 F (37.2 C) (11/27 2114) Temp Source: Oral (11/27 2114) BP: 117/92 (11/27 2114) Pulse Rate: 109 (11/27 2114)  Labs:  Recent Labs  10/20/16 1731 10/20/16 2122 10/21/16 0307  HGB 13.4  --  13.5  HCT 41.3  --  41.4  PLT 153  --  158  LABPROT 14.8  --   --   INR 1.16  --   --   HEPARINUNFRC  --   --  0.44  CREATININE 1.20*  --  1.22*  TROPONINI 0.06* 0.05* 0.05*   Estimated Creatinine Clearance: 58.3 mL/min (by C-G formula based on SCr of 1.22 mg/dL (H)).  Assessment: 65 y.o. female with Afib for heparin Goal of Therapy:  Heparin level 0.3-0.7 units/ml Monitor platelets by anticoagulation protocol: Yes   Plan:  Continue Heparin at current rate   Zaydah Nawabi, [place]onson Curb 10/21/2016,3:55 AM

## 2016-10-21 NOTE — Progress Notes (Addendum)
[name] for heparin Indication: atrial fibrillation  Allergies  Allergen Reactions  . Ace Inhibitors Palpitations    Patient Measurements: Height: 5\' 5"  (165.1 cm) Weight: 243 lb 12.8 oz (110.6 kg) IBW/kg (Calculated) : 57 Heparin Dosing Weight: 82.9 Kg  Vital Signs: Temp: 98.6 F (37 C) (11/28 0556) Temp Source: Oral (11/28 0556) BP: 119/99 (11/28 0556) Pulse Rate: 96 (11/28 0556)  Labs:  Recent Labs  10/20/16 1731 10/20/16 2122 10/21/16 0307  HGB 13.4  --  13.5  HCT 41.3  --  41.4  PLT 153  --  158  LABPROT 14.8  --   --   INR 1.16  --   --   HEPARINUNFRC  --   --  0.44  CREATININE 1.20*  --  1.22*  TROPONINI 0.06* 0.05* 0.05*   Estimated Creatinine Clearance: 58.4 mL/min (by C-G formula based on SCr of 1.22 mg/dL (H)).  Medical History: Past Medical History:  Diagnosis Date  . Arthritis   . DIVERTICULITIS, HX OF 07/25/2007  . DIVERTICULOSIS, COLON 07/22/2007  . Edema, peripheral    a. chronic BLE edema, R>L. Prior trauma from dog attack and accident.  [place] HYPERLIPIDEMIA 02/03/2008  . Hypersomnia    declines w/u  . Hypertension   . MENOPAUSAL DISORDER 01/09/2011  . Morbid obesity ([place]) 07/22/2007  . Raynaud's syndrome 07/22/2007  . THYROID NODULE, RIGHT 01/04/2010  . VITAMIN D DEFICIENCY 01/09/2011   Qualifier: Diagnosis of  By: [name], [name]    Medications:  Prescriptions Prior to Admission  Medication Sig Dispense Refill Last Dose  . aspirin 81 MG EC tablet Take 1 tablet (81 mg total) by mouth daily. Swallow whole. (Patient not taking: Reported on 10/20/2016) 30 tablet 12 Not Taking at Unknown time  . furosemide (LASIX) 20 MG tablet 1 tab by mouth twice per wk as needed for swelling (Patient not taking: Reported on 10/20/2016) 60 tablet 1 Not Taking at Unknown time  . rosuvastatin (CRESTOR) 10 MG tablet Take 1 tablet (10 mg total) by mouth daily. (Patient not taking: Reported on 10/20/2016) 90 tablet 3 Not Taking at  Unknown time   Assessment: Patricia Wagner is a 65 y.o. female with new onset atrial fibrillation with RVR, not on anticoagulation PTA. Continuing on heparin infusion while troponins are cycled. CBC is stable WNL. No bleed documented. May need TEE/DCCV per Cards note.  Heparin level remains therapeutic (0.38) on 1300 units/h.  Goal of Therapy:  Heparin level 0.3-0.7 units/ml Monitor platelets by anticoagulation protocol: Yes   Plan:  Heparin infusion at 1300 units/hr Check anti-Xa level in 6 hours and daily while on heparin Continue to monitor H&H and platelets Monitor for s/sx bleeding F/u Cards plans - plan to transition to NOAC if no ischemic workup needed   Elicia Lamp, PharmD, BCPS Clinical Pharmacist 10/21/2016 8:33 AM

## 2016-10-22 ENCOUNTER — Other Ambulatory Visit (HOSPITAL_COMMUNITY): Payer: Managed Care, Other (non HMO)

## 2016-10-22 LAB — ECHOCARDIOGRAM COMPLETE
AOASC: 34 cm
AV pk vel: 117 cm/s
AVG: 3 mmHg
AVPG: 5 mmHg
DOP CAL AO MEAN VELOCITY: 75.9 cm/s
E decel time: 180 msec
E/e' ratio: 14.65
FS: 11 % — AB (ref 28–44)
Height: 65 in
IVS/LV PW RATIO, ED: 0.87
LADIAMINDEX: 2.17 cm/m2
LASIZE: 50 mm
LAVOL: 178 mL
LAVOLA4C: 143 mL
LAVOLIN: 77.1 mL/m2
LDCA: 2.84 cm2
LEFT ATRIUM END SYS DIAM: 50 mm
LV E/e'average: 14.65
LV e' LATERAL: 6.69 cm/s
LVEEMED: 14.65
LVOTD: 19 mm
MV Dec: 180
MVPG: 4 mmHg
MVPKEVEL: 98 m/s
PW: 15 mm — AB (ref 0.6–1.1)
RV TAPSE: 21.4 mm
TDI e' lateral: 6.69
TDI e' medial: 5.77
VTI: 18.4 cm
Weight: 3900.8 oz

## 2016-10-22 LAB — BASIC METABOLIC PANEL
Anion gap: 9 (ref 5–15)
BUN: 21 mg/dL — ABNORMAL HIGH (ref 6–20)
CALCIUM: 8.6 mg/dL — AB (ref 8.9–10.3)
CO2: 28 mmol/L (ref 22–32)
CREATININE: 1.02 mg/dL — AB (ref 0.44–1.00)
Chloride: 101 mmol/L (ref 101–111)
GFR calc non Af Amer: 57 mL/min — ABNORMAL LOW (ref >60)
Glucose, Bld: 95 mg/dL (ref 65–99)
Potassium: 3.2 mmol/L — ABNORMAL LOW (ref 3.5–5.1)
Sodium: 138 mmol/L (ref 135–145)

## 2016-10-22 LAB — CBC
HCT: 40.3 % (ref 36.0–46.0)
Hemoglobin: 13 g/dL (ref 12.0–15.0)
MCH: 29.3 pg (ref 26.0–34.0)
MCHC: 32.3 g/dL (ref 30.0–36.0)
MCV: 91 fL (ref 78.0–100.0)
PLATELETS: 146 10*3/uL — AB (ref 150–400)
RBC: 4.43 MIL/uL (ref 3.87–5.11)
RDW: 13.9 % (ref 11.5–15.5)
WBC: 7.3 10*3/uL (ref 4.0–10.5)

## 2016-10-22 LAB — HEMOGLOBIN A1C
HEMOGLOBIN A1C: 5.8 % — AB (ref 4.8–5.6)
MEAN PLASMA GLUCOSE: 120 mg/dL

## 2016-10-22 LAB — HEPARIN LEVEL (UNFRACTIONATED)
HEPARIN UNFRACTIONATED: 0.6 [IU]/mL (ref 0.30–0.70)
Heparin Unfractionated: 0.23 IU/mL — ABNORMAL LOW (ref 0.30–0.70)
Heparin Unfractionated: 0.33 IU/mL (ref 0.30–0.70)

## 2016-10-22 LAB — APTT: aPTT: 26 seconds (ref 24–36)

## 2016-10-22 MED ORDER — ASPIRIN 81 MG PO CHEW
81.0000 mg | CHEWABLE_TABLET | ORAL | Status: AC
  Administered 2016-10-23: 81 mg via ORAL
  Filled 2016-10-22: qty 1

## 2016-10-22 MED ORDER — HEPARIN (PORCINE) IN NACL 100-0.45 UNIT/ML-% IJ SOLN
1500.0000 [IU]/h | INTRAMUSCULAR | Status: DC
  Administered 2016-10-23: 1500 [IU]/h via INTRAVENOUS
  Filled 2016-10-22: qty 250

## 2016-10-22 MED ORDER — SODIUM CHLORIDE 0.9 % IV SOLN: 250.0000 mL | INTRAVENOUS | Status: DC | PRN

## 2016-10-22 MED ORDER — INFLUENZA VAC SPLIT QUAD 0.5 ML IM SUSY: 0.5000 mL | PREFILLED_SYRINGE | INTRAMUSCULAR | Status: DC

## 2016-10-22 MED ORDER — METOPROLOL TARTRATE 25 MG PO TABS
25.0000 mg | ORAL_TABLET | Freq: Two times a day (BID) | ORAL | Status: DC
  Administered 2016-10-22 – 2016-10-23 (×3): 25 mg via ORAL
  Filled 2016-10-22 (×3): qty 1

## 2016-10-22 MED ORDER — APIXABAN 5 MG PO TABS
5.0000 mg | ORAL_TABLET | Freq: Two times a day (BID) | ORAL | Status: DC
  Administered 2016-10-22: 5 mg via ORAL
  Filled 2016-10-22: qty 1

## 2016-10-22 MED ORDER — ASPIRIN 81 MG PO CHEW
81.0000 mg | CHEWABLE_TABLET | Freq: Once | ORAL | Status: AC
  Administered 2016-10-22: 81 mg via ORAL
  Filled 2016-10-22: qty 1

## 2016-10-22 MED ORDER — SODIUM CHLORIDE 0.9% FLUSH
3.0000 mL | Freq: Two times a day (BID) | INTRAVENOUS | Status: DC
  Administered 2016-10-22: 3 mL via INTRAVENOUS

## 2016-10-22 MED ORDER — SODIUM CHLORIDE 0.9% FLUSH: 3.0000 mL | INTRAVENOUS | Status: DC | PRN

## 2016-10-22 MED ORDER — PNEUMOCOCCAL VAC POLYVALENT 25 MCG/0.5ML IJ INJ: 0.5000 mL | INJECTION | INTRAMUSCULAR | Status: DC

## 2016-10-22 MED ORDER — SODIUM CHLORIDE 0.9 % IV SOLN
INTRAVENOUS | Status: DC
  Administered 2016-10-23: 06:00:00 via INTRAVENOUS

## 2016-10-22 MED ORDER — POTASSIUM CHLORIDE CRYS ER 20 MEQ PO TBCR
40.0000 meq | EXTENDED_RELEASE_TABLET | Freq: Every day | ORAL | Status: DC
  Administered 2016-10-22: 40 meq via ORAL
  Filled 2016-10-22 (×2): qty 2

## 2016-10-22 NOTE — Progress Notes (Signed)
Patient Name: Patricia Wagner Date of Encounter: 10/22/2016  Primary Cardiologist: New to Dr. Ladonna Snide Problem List     Principal Problem:   Atrial fibrillation with rapid ventricular response [facility]) Active Problems:   Essential hypertension   Tobacco abuse   Obesity   CKD (chronic kidney disease), stage III   Acute CHF (congestive heart failure) (HCC)   Atrial fibrillation with RVR (HCC)   Subjective   Denies chest pain or dyspnea. Wants to go home.   Inpatient Medications    Scheduled Meds: . diltiazem  60 mg Oral Q8H  . potassium chloride  40 mEq Oral Daily  . sodium chloride flush  3 mL Intravenous Q12H   Continuous Infusions: . heparin 1,500 Units/hr (10/22/16 0414)   PRN Meds: sodium chloride, acetaminophen, levalbuterol, ondansetron (ZOFRAN) IV, sodium chloride flush   Vital Signs    Vitals:   10/21/16 1415 10/21/16 1434 10/21/16 2033 10/22/16 0519  BP: 115/69 100/62 114/60 113/71  Pulse: (!) 105  100 (!) 103  Resp: 18  18 18   Temp: 98.6 F (37 C)  99 F (37.2 C) 98.6 F (37 C)  TempSrc: Oral  Oral Oral  SpO2: 95%  95% 93%  Weight:    235 lb 14.4 oz (107 kg)  Height:        Intake/Output Summary (Last 24 hours) at 10/22/16 0833 Last data filed at 10/21/16 2255  Gross per 24 hour  Intake              240 ml  Output             1700 ml  Net            -1460 ml   Filed Weights   10/20/16 2114 10/21/16 0556 10/22/16 0519  Weight: 242 lb 8 oz (110 kg) 243 lb 12.8 oz (110.6 kg) 235 lb 14.4 oz (107 kg)    Physical Exam    GEN: Well nourished, well developed, in no acute distress.  HEENT: Grossly normal.  Neck: Supple, + JVD, carotid bruits, or masses. Cardiac: Ir IR, no murmurs, rubs, or gallops. No clubbing, cyanosis. 1 + BL LE edema.  Radials/DP/PT 2+ and equal bilaterally.  Respiratory:  Respirations regular and unlabored, diminished breath sounds GI: Soft, nontender, nondistended, BS + x 4. MS: no deformity or atrophy. Skin: warm and  dry, no rash. Neuro:  Strength and sensation are intact. Psych: AAOx3.  Normal affect.  Labs    CBC  Recent Labs  10/20/16 1731 10/21/16 0307 10/22/16 0245  WBC 7.9 7.5 7.3  NEUTROABS 5.9  --   --   HGB 13.4 13.5 13.0  HCT 41.3 41.4 40.3  MCV 90.4 90.6 91.0  PLT 153 158 030*   Basic Metabolic Panel  Recent Labs  10/20/16 1731 10/20/16 2122 10/21/16 0307 10/22/16 0245  NA 138  --  139 138  K 3.7  --  3.1* 3.2*  CL 103  --  101 101  CO2 27  --  28 28  GLUCOSE 95  --  104* 95  BUN 26*  --  28* 21*  CREATININE 1.20*  --  1.22* 1.02*  CALCIUM 8.8*  --  9.0 8.6*  MG 1.9 1.9  --   --    Liver Function Tests  Recent Labs  10/20/16 2122  AST 69*  ALT 113*  ALKPHOS 92  BILITOT 1.2  PROT 6.5  ALBUMIN 3.2*   No results for input(s): LIPASE,  AMYLASE in the last 72 hours. Cardiac Enzymes  Recent Labs  10/20/16 2122 10/21/16 0307 10/21/16 0932  TROPONINI 0.05* 0.05* 0.04*   BNP Invalid input(s): POCBNP D-Dimer No results for input(s): DDIMER in the last 72 hours. Hemoglobin A1C  Recent Labs  10/20/16 2122  HGBA1C 5.8*   Fasting Lipid Panel  Recent Labs  10/21/16 0307  CHOL 153  HDL 46  LDLCALC 95  TRIG 58  CHOLHDL 3.3   Thyroid Function Tests  Recent Labs  10/20/16 1731  TSH 1.007    Telemetry    afib at rate of 90s, transiently goes into 130s - Personally Reviewed  ECG    N/A  Radiology    Dg Chest Port 1 View  Result Date: 10/20/2016 CLINICAL DATA:  Atrial fibrillation and shortness of breath. EXAM: PORTABLE CHEST 1 VIEW COMPARISON:  05/04/2008 FINDINGS: The heart is enlarged. There is aortic atherosclerosis. There is pulmonary venous hypertension with early interstitial edema. No focal pulmonary lesion. No acute bone finding. IMPRESSION: Cardiomegaly and interstitial edema. Electronically Signed   By: Nelson Chimes M.D.   On: 10/20/2016 19:27    Cardiac Studies   Pending echo  Patient Profile     62F with arthritis,  chronic BLE edema (R>L, following dog attack and ortho injuries), HTN, obesity, 50 year tobacco history and probable mild CKD who presented to [facility] with DOE, found to be in atrial fib RVR and probable acute CHF.  Assessment & Plan     1. Atrial fib RVR  - Unknown duration. TSH normal. Rate in 90-100s on po Cardizem 60mg  q 8 hours.  However, transiently goes to 130 with ambulation. Her dyspnea has resolved. Echo has been done, pending reading. Consider up-titrating CCB or add BB pending echo. On heparin, will switch to Eliquis. If she is asymptomatic, can consider DCCV after 4 weeks of anticoagulation.  2. Elevated troponin - Flat trend. Likely due to elevated rate and acute CKF.   3. Acute CHF, unknown if diastolic or systolic  -  BNP 852. Given IV lasix 60mg  x 1 and IV lasix 40mg  x 1. Urine out put was not recorded initially, now negative 1.2L. Advise to cut back on salt. Try compression stocking. Diuretics per MD. Pending echo.  - Consider adding BB and ACE/ARB pending echo.   4. CKD stage III  - Cr improved with diuresis.   5. HTN  - Hold triamterene/HCTZ to make way for IV diuresis and rate control.  6. Transaminitis -Will hold statin for now. F/u with PCP.   7. Hyperlipidemia  - 10/21/2016: Cholesterol 153; HDL 46; LDL Cholesterol 95; Triglycerides 58; VLDL 12  -  Hold statin as above.   8. Tobacco abuse   - Advised cessation. Decreased airflow on forced expiration and some hyperinflation on xray sugg COPD. F/u with PCP.    Signed, Leanor Kail, PA  10/22/2016, 8:33 AM   As above, patient seen and examined. She denies chest pain. Dyspnea is improving. Echocardiogram shows severe LV dysfunction. Etiology unclear. Plan cardiac catheterization tomorrow to exclude coronary disease. The risks and benefits including myocardial infarction, CVA and death discussed and she agrees to proceed. Hold apixaban prior to procedure (only received 2 doses). Cardiomyopathy may be  tachycardia mediated as well. I will discontinue Cardizem given LV dysfunction and instead control heart rate with metoprolol. Will begin 25 mg twice a day. Transition to XL once dose finalized. Add ACE inhibitor later has blood pressure allows. Once coronaries are addressed and resume  anticoagulation and plan TEE guided cardioversion if she remains symptomatic despite rate control or 3 weeks of anticoagulation followed by cardioversion if her symptoms are controlled.  Kirk Ruths, MD

## 2016-10-22 NOTE — Progress Notes (Signed)
Insurance check completed for Eliquis S/W [place] @ [place] # [id number] OPT- 6   ELIQUIS 5 MG BID 30 / 60 TAB   COVER- YES  CO-PAY- $ 70.00  TIER- 2 DRUG  PRIOR APPROVAL -NO  PHARMACY : [facility]

## 2016-10-22 NOTE — Progress Notes (Signed)
[place] for heparin Indication: atrial fibrillation  Allergies  Allergen Reactions  . Ace Inhibitors Palpitations    Patient Measurements: Height: 5\' 5"  (165.1 cm) Weight: 243 lb 12.8 oz (110.6 kg) IBW/kg (Calculated) : 57 Heparin Dosing Weight: 82.9 Kg  Vital Signs: Temp: 99 F (37.2 C) (11/28 2033) Temp Source: Oral (11/28 2033) BP: 114/60 (11/28 2033) Pulse Rate: 100 (11/28 2033)  Labs:  Recent Labs  10/20/16 1731 10/20/16 2122 10/21/16 0307 10/21/16 0932 10/22/16 0245  HGB 13.4  --  13.5  --  13.0  HCT 41.3  --  41.4  --  40.3  PLT 153  --  158  --  146*  LABPROT 14.8  --   --   --   --   INR 1.16  --   --   --   --   HEPARINUNFRC  --   --  0.44 0.38 0.23*  CREATININE 1.20*  --  1.22*  --  1.02*  TROPONINI 0.06* 0.05* 0.05* 0.04*  --    Estimated Creatinine Clearance: 69.9 mL/min (by C-G formula based on SCr of 1.02 mg/dL (H)).  Assessment: Patricia Wagner is a 65 y.o. female with new onset atrial fibrillation with RVR, not on anticoagulation PTA. Continuing on heparin infusion while troponins are cycled. CBC is stable. May need TEE/DCCV per Cards note.  Heparin level now down to subtherapeutic (0.23) on 1300 units/hr. No issues with line or bleeding reported per RN.  Goal of Therapy:  Heparin level 0.3-0.7 units/ml Monitor platelets by anticoagulation protocol: Yes   Plan:  Increase heparin infusion to 1500 units/hr Check anti-Xa level in 6 hours  F/u Cards plans - plan to transition to NOAC if no ischemic workup needed  Sherlon Handing, PharmD, BCPS Clinical pharmacist, pager [phone] 10/22/2016 4:11 AM

## 2016-10-22 NOTE — Progress Notes (Signed)
[place] for apixaban > heparin Indication: atrial fibrillation  Allergies  Allergen Reactions  . Ace Inhibitors Palpitations    Patient Measurements: Height: 5\' 5"  (165.1 cm) Weight: 235 lb 14.4 oz (107 kg) IBW/kg (Calculated) : 57 Heparin Dosing Weight: 82.9 Kg  Vital Signs: Temp: 98.6 F (37 C) (11/29 0519) Temp Source: Oral (11/29 0519) BP: 113/71 (11/29 0519) Pulse Rate: 103 (11/29 0519)  Labs:  Recent Labs  10/20/16 1731 10/20/16 2122  10/21/16 0307 10/21/16 0932 10/22/16 0245 10/22/16 0954  HGB 13.4  --   --  13.5  --  13.0  --   HCT 41.3  --   --  41.4  --  40.3  --   PLT 153  --   --  158  --  146*  --   LABPROT 14.8  --   --   --   --   --   --   INR 1.16  --   --   --   --   --   --   HEPARINUNFRC  --   --   < > 0.44 0.38 0.23* 0.33  CREATININE 1.20*  --   --  1.22*  --  1.02*  --   TROPONINI 0.06* 0.05*  --  0.05* 0.04*  --   --   < > = values in this interval not displayed. Estimated Creatinine Clearance: 68.6 mL/min (by C-G formula based on SCr of 1.02 mg/dL (H)).  Assessment: 52 yof with new onset atrial fibrillation, not on anticoagulation PTA. Pt was transitioned this AM from heparin infusion to apixaban. However, Cards now planning for cath tomorrow - Pharmacy consulted to resume heparin. Apixaban d/c'd. CBC stable, no bleed documented.  Heparin previously therapeutic on 1500 units/h at 0.33. Only 1 dose of apixaban given so far at 1155. Will dose based on APTT while apixaban expected to affect heparin level.   Goal of Therapy:  Heparin level 0.3-0.7 units/ml Monitor platelets by anticoagulation protocol: Yes   Plan:  Resume heparin infusion at 1500 units/hr - start 12 hours after last apixaban dose Check baseline aPTT, expect heparin level will be elevated Baseline aPTT/heparin level 6h aPTT, Daily aPTT/heparin level/CBC Monitor for s/sx bleeding Apixaban on hold for cath 11/30   Elicia Lamp,  PharmD, BCPS Clinical Pharmacist 10/22/2016 12:12 PM

## 2016-10-22 NOTE — Discharge Instructions (Addendum)
Weigh daily Call [phone] if weight climbs more than 3 pounds in a day or 5 pounds in a week. No salt to very little salt in your diet.  No more than 2000 mg in a day. Call if increased shortness of breath or increased swelling.   Call [facility] at [phone]  if any bleeding, swelling or drainage at cath site.  May shower, no tub baths for 48 hours for groin sticks. No lifting over 5 pounds for 3 days.  No Driving for 3 days  You will be seen next week in the office.   Please call Monday and ask for the appointment.                Information on my medicine - ELIQUIS (apixaban)  This medication education was reviewed with me or my healthcare representative as part of my discharge preparation.  Why was Eliquis prescribed for you? Eliquis was prescribed for you to reduce the risk of a blood clot forming that can cause a stroke if you have a medical condition called atrial fibrillation (a type of irregular heartbeat).  What do You need to know about Eliquis ? Take your Eliquis TWICE DAILY - one tablet in the morning and one tablet in the evening with or without food. If you have difficulty swallowing the tablet whole please discuss with your pharmacist how to take the medication safely.  Take Eliquis exactly as prescribed by your doctor and DO NOT stop taking Eliquis without talking to the doctor who prescribed the medication.  Stopping may increase your risk of developing a stroke.  Refill your prescription before you run out.  After discharge, you should have regular check-up appointments with your healthcare provider that is prescribing your Eliquis.  In the future your dose may need to be changed if your kidney function or weight changes by a significant amount or as you get older.  What do you do if you miss a dose? If you miss a dose, take it as soon as you remember on the same day and resume taking twice daily.  Do not take more than  one dose of ELIQUIS at the same time to make up a missed dose.  Important Safety Information A possible side effect of Eliquis is bleeding. You should call your healthcare provider right away if you experience any of the following: ? Bleeding from an injury or your nose that does not stop. ? Unusual colored urine (red or dark brown) or unusual colored stools (red or black). ? Unusual bruising for unknown reasons. ? A serious fall or if you hit your head (even if there is no bleeding).  Some medicines may interact with Eliquis and might increase your risk of bleeding or clotting while on Eliquis. To help avoid this, consult your healthcare provider or pharmacist prior to using any new prescription or non-prescription medications, including herbals, vitamins, non-steroidal anti-inflammatory drugs (NSAIDs) and supplements.  This website has more information on Eliquis (apixaban): http://www.eliquis.com/eliquis/home

## 2016-10-23 ENCOUNTER — Other Ambulatory Visit (HOSPITAL_COMMUNITY): Payer: Managed Care, Other (non HMO)

## 2016-10-23 ENCOUNTER — Encounter (HOSPITAL_COMMUNITY)
Admission: EM | Disposition: A | Discharge status: Home / Self Care | Payer: Self-pay | Source: Home / Self Care | Attending: Internal Medicine

## 2016-10-23 DIAGNOSIS — I429 Cardiomyopathy, unspecified: Secondary | ICD-10-CM

## 2016-10-23 DIAGNOSIS — I5021 Acute systolic (congestive) heart failure: Secondary | ICD-10-CM | POA: Diagnosis present

## 2016-10-23 HISTORY — PX: CARDIAC CATHETERIZATION: SHX172

## 2016-10-23 LAB — CBC
HCT: 44.1 % (ref 36.0–46.0)
Hemoglobin: 14.3 g/dL (ref 12.0–15.0)
MCH: 29.7 pg (ref 26.0–34.0)
MCHC: 32.4 g/dL (ref 30.0–36.0)
MCV: 91.7 fL (ref 78.0–100.0)
PLATELETS: 174 10*3/uL (ref 150–400)
RBC: 4.81 MIL/uL (ref 3.87–5.11)
RDW: 14 % (ref 11.5–15.5)
WBC: 6.3 10*3/uL (ref 4.0–10.5)

## 2016-10-23 LAB — BASIC METABOLIC PANEL
Anion gap: 11 (ref 5–15)
BUN: 20 mg/dL (ref 6–20)
CALCIUM: 9.1 mg/dL (ref 8.9–10.3)
CO2: 29 mmol/L (ref 22–32)
CREATININE: 1.04 mg/dL — AB (ref 0.44–1.00)
Chloride: 98 mmol/L — ABNORMAL LOW (ref 101–111)
GFR calc non Af Amer: 56 mL/min — ABNORMAL LOW (ref >60)
Glucose, Bld: 105 mg/dL — ABNORMAL HIGH (ref 65–99)
Potassium: 3.6 mmol/L (ref 3.5–5.1)
SODIUM: 138 mmol/L (ref 135–145)

## 2016-10-23 LAB — APTT: APTT: 52 s — AB (ref 24–36)

## 2016-10-23 LAB — HEPARIN LEVEL (UNFRACTIONATED): Heparin Unfractionated: 0.95 IU/mL — ABNORMAL HIGH (ref 0.30–0.70)

## 2016-10-23 SURGERY — LEFT HEART CATH AND CORONARY ANGIOGRAPHY

## 2016-10-23 MED ORDER — LIDOCAINE HCL (PF) 1 % IJ SOLN
INTRAMUSCULAR | Status: DC | PRN
  Administered 2016-10-23: 2 mL

## 2016-10-23 MED ORDER — SODIUM CHLORIDE 0.9 % IV SOLN: 250.0000 mL | INTRAVENOUS | Status: DC | PRN

## 2016-10-23 MED ORDER — SODIUM CHLORIDE 0.9% FLUSH: 3.0000 mL | INTRAVENOUS | Status: DC | PRN

## 2016-10-23 MED ORDER — SODIUM CHLORIDE 0.9% FLUSH: 3.0000 mL | Freq: Two times a day (BID) | INTRAVENOUS | Status: DC

## 2016-10-23 MED ORDER — MIDAZOLAM HCL 2 MG/2ML IJ SOLN
INTRAMUSCULAR | Status: DC | PRN
  Administered 2016-10-23: 1 mg via INTRAVENOUS

## 2016-10-23 MED ORDER — APIXABAN 5 MG PO TABS
5.0000 mg | ORAL_TABLET | Freq: Two times a day (BID) | ORAL | Status: DC
  Administered 2016-10-23 – 2016-10-24 (×2): 5 mg via ORAL
  Filled 2016-10-23 (×2): qty 1

## 2016-10-23 MED ORDER — HEPARIN SODIUM (PORCINE) 1000 UNIT/ML IJ SOLN
INTRAMUSCULAR | Status: AC
  Filled 2016-10-23: qty 1

## 2016-10-23 MED ORDER — IOPAMIDOL (ISOVUE-370) INJECTION 76%
INTRAVENOUS | Status: AC
  Filled 2016-10-23: qty 100

## 2016-10-23 MED ORDER — SODIUM CHLORIDE 0.9% FLUSH
3.0000 mL | Freq: Two times a day (BID) | INTRAVENOUS | Status: DC
Start: 2016-10-23 — End: 2016-10-24

## 2016-10-23 MED ORDER — MIDAZOLAM HCL 2 MG/2ML IJ SOLN
INTRAMUSCULAR | Status: AC
  Filled 2016-10-23: qty 2

## 2016-10-23 MED ORDER — LIDOCAINE HCL (PF) 1 % IJ SOLN
INTRAMUSCULAR | Status: AC
  Filled 2016-10-23: qty 30

## 2016-10-23 MED ORDER — HEPARIN (PORCINE) IN NACL 2-0.9 UNIT/ML-% IJ SOLN
INTRAMUSCULAR | Status: DC | PRN
  Administered 2016-10-23: 1000 mL

## 2016-10-23 MED ORDER — FENTANYL CITRATE (PF) 100 MCG/2ML IJ SOLN
INTRAMUSCULAR | Status: AC
Start: 2016-10-23 — End: 2016-10-23
  Filled 2016-10-23: qty 2

## 2016-10-23 MED ORDER — METOPROLOL TARTRATE 50 MG PO TABS
50.0000 mg | ORAL_TABLET | Freq: Two times a day (BID) | ORAL | Status: DC
  Administered 2016-10-23 – 2016-10-24 (×2): 50 mg via ORAL
  Filled 2016-10-23 (×2): qty 1

## 2016-10-23 MED ORDER — VERAPAMIL HCL 2.5 MG/ML IV SOLN
INTRAVENOUS | Status: DC | PRN
  Administered 2016-10-23: 10 mL via INTRA_ARTERIAL

## 2016-10-23 MED ORDER — HEPARIN SODIUM (PORCINE) 1000 UNIT/ML IJ SOLN
INTRAMUSCULAR | Status: DC | PRN
  Administered 2016-10-23: 5000 [IU] via INTRAVENOUS

## 2016-10-23 MED ORDER — FENTANYL CITRATE (PF) 100 MCG/2ML IJ SOLN
INTRAMUSCULAR | Status: DC | PRN
  Administered 2016-10-23: 50 ug via INTRAVENOUS

## 2016-10-23 MED ORDER — SODIUM CHLORIDE 0.9 % IV SOLN
250.0000 mL | INTRAVENOUS | Status: DC | PRN
Start: 2016-10-23 — End: 2016-10-24

## 2016-10-23 MED ORDER — IOPAMIDOL (ISOVUE-370) INJECTION 76%
INTRAVENOUS | Status: DC | PRN
  Administered 2016-10-23: 60 mL via INTRA_ARTERIAL

## 2016-10-23 MED ORDER — VERAPAMIL HCL 2.5 MG/ML IV SOLN
INTRAVENOUS | Status: AC
  Filled 2016-10-23: qty 2

## 2016-10-23 MED ORDER — HEPARIN (PORCINE) IN NACL 2-0.9 UNIT/ML-% IJ SOLN
INTRAMUSCULAR | Status: AC
  Filled 2016-10-23: qty 1000

## 2016-10-23 SURGICAL SUPPLY — 9 items
CATH IMPULSE 5F ANG/FL3.5 (CATHETERS) ×2 IMPLANT
DEVICE RAD COMP TR BAND LRG (VASCULAR PRODUCTS) ×2 IMPLANT
GLIDESHEATH SLEND SS 6F .021 (SHEATH) ×2 IMPLANT
GUIDEWIRE INQWIRE 1.5J.035X260 (WIRE) ×1 IMPLANT
INQWIRE 1.5J .035X260CM (WIRE) ×2
KIT HEART LEFT (KITS) ×2 IMPLANT
PACK CARDIAC CATHETERIZATION (CUSTOM PROCEDURE TRAY) ×2 IMPLANT
TRANSDUCER W/STOPCOCK (MISCELLANEOUS) ×2 IMPLANT
TUBING CIL FLEX 10 FLL-RA (TUBING) ×2 IMPLANT

## 2016-10-23 NOTE — Progress Notes (Signed)
TR band removed from right radial site. No signs of bleeding. Dressing placed, will continue to monitor.  Cyndia Bent RN

## 2016-10-23 NOTE — Brief Op Note (Signed)
Brief Cardiac Catheterization Note (Full Note to Follow)  10/23/2016  3:21 PM  PATIENT:  Patricia Wagner  65 y.o. female  PRE-OPERATIVE DIAGNOSIS: Cardiomyopathy  POST-OPERATIVE DIAGNOSIS:  Non-ischemic cardiomyopathy  PROCEDURE:  Procedure(s): Left Heart Cath and Coronary Angiography (N/A)  SURGEON:  Surgeon(s) and Role:    * [name], MD - Primary  Findings: 1.  Mild, non-obstructive coronary artery disease. 2.  Elevated left ventricular filling pressure (likely 20-25 mmHg but difficult to assess due to a-fib with RVR).  Recommendations: 1.  Continue medical therapy of non-ischemic cardiomyopathy and atrial fibrillation with rapid ventricular response. 2.  Restart heparin 2 hours after TR band removed.  [name], MD [facility] Pager: [phone]

## 2016-10-23 NOTE — Progress Notes (Signed)
Patient Name: Patricia Wagner Date of Encounter: 10/23/2016  Primary Cardiologist: New to Dr. Ladonna Snide Problem List     Principal Problem:   Atrial fibrillation with rapid ventricular response [facility]) Active Problems:   Essential hypertension   Tobacco abuse   Obesity   CKD (chronic kidney disease), stage III   Acute CHF (congestive heart failure) (HCC)   Atrial fibrillation with RVR (HCC)   Subjective   Denies chest pain or dyspnea.   Inpatient Medications    Scheduled Meds: . Influenza vac split quadrivalent PF  0.5 mL Intramuscular Tomorrow-1000  . metoprolol tartrate  25 mg Oral BID  . pneumococcal 23 valent vaccine  0.5 mL Intramuscular Tomorrow-1000  . potassium chloride  40 mEq Oral Daily  . sodium chloride flush  3 mL Intravenous Q12H  . sodium chloride flush  3 mL Intravenous Q12H   Continuous Infusions: . sodium chloride 10 mL/hr at 10/23/16 0603  . heparin 1,500 Units/hr (10/23/16 0113)   PRN Meds: sodium chloride, sodium chloride, acetaminophen, levalbuterol, ondansetron (ZOFRAN) IV, sodium chloride flush, sodium chloride flush   Vital Signs    Vitals:   10/22/16 2100 10/23/16 0416 10/23/16 0500 10/23/16 1031  BP: 111/80 (!) 115/51  134/72  Pulse: (!) 106 (!) 50  (!) 51  Resp: 18 (!) 98    Temp: 98.7 F (37.1 C) 98.1 F (36.7 C)    TempSrc: Oral Oral    SpO2: 92% 93%    Weight:   239 lb 9.6 oz (108.7 kg)   Height:        Intake/Output Summary (Last 24 hours) at 10/23/16 1226 Last data filed at 10/23/16 1046  Gross per 24 hour  Intake              360 ml  Output              601 ml  Net             -241 ml   Filed Weights   10/21/16 0556 10/22/16 0519 10/23/16 0500  Weight: 243 lb 12.8 oz (110.6 kg) 235 lb 14.4 oz (107 kg) 239 lb 9.6 oz (108.7 kg)    Physical Exam    GEN: Well nourished, well developed, in no acute distress.  HEENT: Grossly normal.  Neck: Supple Cardiac: Irregular, trace lower ext edema Respiratory:  CTA GI:  Soft, nontender, nondistended MS: no deformity or atrophy. Skin: warm and dry, no rash. Neuro:  Strength and sensation are intact. Psych: AAOx3.  Normal affect.  Labs    CBC  Recent Labs  10/20/16 1731  10/22/16 0245 10/23/16 0656  WBC 7.9  < > 7.3 6.3  NEUTROABS 5.9  --   --   --   HGB 13.4  < > 13.0 14.3  HCT 41.3  < > 40.3 44.1  MCV 90.4  < > 91.0 91.7  PLT 153  < > 146* 174  < > = values in this interval not displayed. Basic Metabolic Panel  Recent Labs  10/20/16 1731 10/20/16 2122  10/22/16 0245 10/23/16 0656  NA 138  --   < > 138 138  K 3.7  --   < > 3.2* 3.6  CL 103  --   < > 101 98*  CO2 27  --   < > 28 29  GLUCOSE 95  --   < > 95 105*  BUN 26*  --   < > 21* 20  CREATININE 1.20*  --   < >  1.02* 1.04*  CALCIUM 8.8*  --   < > 8.6* 9.1  MG 1.9 1.9  --   --   --   < > = values in this interval not displayed. Liver Function Tests  Recent Labs  10/20/16 2122  AST 69*  ALT 113*  ALKPHOS 92  BILITOT 1.2  PROT 6.5  ALBUMIN 3.2*   Cardiac Enzymes  Recent Labs  10/20/16 2122 10/21/16 0307 10/21/16 0932  TROPONINI 0.05* 0.05* 0.04*   Hemoglobin A1C  Recent Labs  10/20/16 2122  HGBA1C 5.8*   Fasting Lipid Panel  Recent Labs  10/21/16 0307  CHOL 153  HDL 46  LDLCALC 95  TRIG 58  CHOLHDL 3.3   Thyroid Function Tests  Recent Labs  10/20/16 1731  TSH 1.007    Telemetry    afib at rate of 90s, transiently goes into 110s - Personally Reviewed   Patient Profile     71F with arthritis, chronic BLE edema (R>L, following dog attack and ortho injuries), HTN, obesity, 50 year tobacco history and probable mild CKD who presented to [facility] with DOE, found to be in atrial fib RVR and probable acute CHF.  Assessment & Plan     1. Atrial fib RVR  - Unknown duration. TSH normal. Rate in 90-110s; increase metoprolol to 50 BID. Echocardiogram showed severe LV dysfunction of unknown etiology. Plan cardiac catheterization today to exclude coronary  disease. Cardiomyopathy may also be tachycardia mediated. Once coronaries addressed we will change to  Apixaban; if asymptomatic and rate controlled can discharge home with outpatient cardioversion in 3 weeks. Otherwise would need to proceed with TEE guided cardioversion.   2. HTN  -  blood pressure controlled. Continue present medications.  3. Transaminitiwill need follow-up with primary care.  4. Tobacco abuse   - Advised cessation.   5.  cardiomyopathy-etiology unknown as described above. Catheterization to exclude coronary disease. They be tachycardia mediated. Increase metoprolol and once dose adjusted change to Toprol long-term. Add ACE inhibitor later has blood pressure allows.    Signed, Kirk Ruths, MD  10/23/2016, 12:26 PM

## 2016-10-23 NOTE — Interval H&P Note (Signed)
History and Physical Interval Note:  10/23/2016 2:53 PM  Patricia Wagner  has presented today for surgery, with the diagnosis of low ef  The various methods of treatment have been discussed with the patient and family. After consideration of risks, benefits and other options for treatment, the patient has consented to  Procedure(s): Left Heart Cath and Coronary Angiography (N/A) as a surgical intervention .  The patient's history has been reviewed, patient examined, no change in status, stable for surgery.  I have reviewed the patient's chart and labs.  Questions were answered to the patient's satisfaction.    Cath Lab Visit (complete for each Cath Lab visit)  Clinical Evaluation Leading to the Procedure:   ACS: No.  Non-ACS:    Anginal Classification: CCS IV  Anti-ischemic medical therapy: No Therapy  Non-Invasive Test Results: No non-invasive testing performed  Prior CABG: No previous CABG   Patricia Wagner

## 2016-10-23 NOTE — H&P (View-Only) (Signed)
Patient Name: Patricia Wagner Date of Encounter: 10/23/2016  Primary Cardiologist: New to Dr. Ladonna Snide Problem List     Principal Problem:   Atrial fibrillation with rapid ventricular response [facility]) Active Problems:   Essential hypertension   Tobacco abuse   Obesity   CKD (chronic kidney disease), stage III   Acute CHF (congestive heart failure) (HCC)   Atrial fibrillation with RVR (HCC)   Subjective   Denies chest pain or dyspnea.   Inpatient Medications    Scheduled Meds: . Influenza vac split quadrivalent PF  0.5 mL Intramuscular Tomorrow-1000  . metoprolol tartrate  25 mg Oral BID  . pneumococcal 23 valent vaccine  0.5 mL Intramuscular Tomorrow-1000  . potassium chloride  40 mEq Oral Daily  . sodium chloride flush  3 mL Intravenous Q12H  . sodium chloride flush  3 mL Intravenous Q12H   Continuous Infusions: . sodium chloride 10 mL/hr at 10/23/16 0603  . heparin 1,500 Units/hr (10/23/16 0113)   PRN Meds: sodium chloride, sodium chloride, acetaminophen, levalbuterol, ondansetron (ZOFRAN) IV, sodium chloride flush, sodium chloride flush   Vital Signs    Vitals:   10/22/16 2100 10/23/16 0416 10/23/16 0500 10/23/16 1031  BP: 111/80 (!) 115/51  134/72  Pulse: (!) 106 (!) 50  (!) 51  Resp: 18 (!) 98    Temp: 98.7 F (37.1 C) 98.1 F (36.7 C)    TempSrc: Oral Oral    SpO2: 92% 93%    Weight:   239 lb 9.6 oz (108.7 kg)   Height:        Intake/Output Summary (Last 24 hours) at 10/23/16 1226 Last data filed at 10/23/16 1046  Gross per 24 hour  Intake              360 ml  Output              601 ml  Net             -241 ml   Filed Weights   10/21/16 0556 10/22/16 0519 10/23/16 0500  Weight: 243 lb 12.8 oz (110.6 kg) 235 lb 14.4 oz (107 kg) 239 lb 9.6 oz (108.7 kg)    Physical Exam    GEN: Well nourished, well developed, in no acute distress.  HEENT: Grossly normal.  Neck: Supple Cardiac: Irregular, trace lower ext edema Respiratory:  CTA GI:  Soft, nontender, nondistended MS: no deformity or atrophy. Skin: warm and dry, no rash. Neuro:  Strength and sensation are intact. Psych: AAOx3.  Normal affect.  Labs    CBC  Recent Labs  10/20/16 1731  10/22/16 0245 10/23/16 0656  WBC 7.9  < > 7.3 6.3  NEUTROABS 5.9  --   --   --   HGB 13.4  < > 13.0 14.3  HCT 41.3  < > 40.3 44.1  MCV 90.4  < > 91.0 91.7  PLT 153  < > 146* 174  < > = values in this interval not displayed. Basic Metabolic Panel  Recent Labs  10/20/16 1731 10/20/16 2122  10/22/16 0245 10/23/16 0656  NA 138  --   < > 138 138  K 3.7  --   < > 3.2* 3.6  CL 103  --   < > 101 98*  CO2 27  --   < > 28 29  GLUCOSE 95  --   < > 95 105*  BUN 26*  --   < > 21* 20  CREATININE 1.20*  --   < >  1.02* 1.04*  CALCIUM 8.8*  --   < > 8.6* 9.1  MG 1.9 1.9  --   --   --   < > = values in this interval not displayed. Liver Function Tests  Recent Labs  10/20/16 2122  AST 69*  ALT 113*  ALKPHOS 92  BILITOT 1.2  PROT 6.5  ALBUMIN 3.2*   Cardiac Enzymes  Recent Labs  10/20/16 2122 10/21/16 0307 10/21/16 0932  TROPONINI 0.05* 0.05* 0.04*   Hemoglobin A1C  Recent Labs  10/20/16 2122  HGBA1C 5.8*   Fasting Lipid Panel  Recent Labs  10/21/16 0307  CHOL 153  HDL 46  LDLCALC 95  TRIG 58  CHOLHDL 3.3   Thyroid Function Tests  Recent Labs  10/20/16 1731  TSH 1.007    Telemetry    afib at rate of 90s, transiently goes into 110s - Personally Reviewed   Patient Profile     59F with arthritis, chronic BLE edema (R>L, following dog attack and ortho injuries), HTN, obesity, 50 year tobacco history and probable mild CKD who presented to [facility] with DOE, found to be in atrial fib RVR and probable acute CHF.  Assessment & Plan     1. Atrial fib RVR  - Unknown duration. TSH normal. Rate in 90-110s; increase metoprolol to 50 BID. Echocardiogram showed severe LV dysfunction of unknown etiology. Plan cardiac catheterization today to exclude coronary  disease. Cardiomyopathy may also be tachycardia mediated. Once coronaries addressed we will change to  Apixaban; if asymptomatic and rate controlled can discharge home with outpatient cardioversion in 3 weeks. Otherwise would need to proceed with TEE guided cardioversion.   2. HTN  -  blood pressure controlled. Continue present medications.  3. Transaminitiwill need follow-up with primary care.  4. Tobacco abuse   - Advised cessation.   5.  cardiomyopathy-etiology unknown as described above. Catheterization to exclude coronary disease. They be tachycardia mediated. Increase metoprolol and once dose adjusted change to Toprol long-term. Add ACE inhibitor later has blood pressure allows.    Signed, Kirk Ruths, MD  10/23/2016, 12:26 PM

## 2016-10-24 ENCOUNTER — Encounter (HOSPITAL_COMMUNITY): Payer: Self-pay | Admitting: Internal Medicine

## 2016-10-24 DIAGNOSIS — I428 Other cardiomyopathies: Secondary | ICD-10-CM

## 2016-10-24 DIAGNOSIS — I2510 Atherosclerotic heart disease of native coronary artery without angina pectoris: Secondary | ICD-10-CM

## 2016-10-24 HISTORY — DX: Other cardiomyopathies: I42.8

## 2016-10-24 HISTORY — DX: Atherosclerotic heart disease of native coronary artery without angina pectoris: I25.10

## 2016-10-24 LAB — BASIC METABOLIC PANEL
Anion gap: 10 (ref 5–15)
BUN: 21 mg/dL — AB (ref 6–20)
CHLORIDE: 100 mmol/L — AB (ref 101–111)
CO2: 28 mmol/L (ref 22–32)
CREATININE: 1.08 mg/dL — AB (ref 0.44–1.00)
Calcium: 9.1 mg/dL (ref 8.9–10.3)
GFR calc Af Amer: >60 mL/min (ref >60)
GFR, EST NON AFRICAN AMERICAN: 53 mL/min — AB (ref >60)
GLUCOSE: 98 mg/dL (ref 65–99)
Potassium: 3.9 mmol/L (ref 3.5–5.1)
SODIUM: 138 mmol/L (ref 135–145)

## 2016-10-24 LAB — APTT: APTT: 29 s (ref 24–36)

## 2016-10-24 LAB — CBC
HEMATOCRIT: 42.5 % (ref 36.0–46.0)
HEMOGLOBIN: 13.6 g/dL (ref 12.0–15.0)
MCH: 29.4 pg (ref 26.0–34.0)
MCHC: 32 g/dL (ref 30.0–36.0)
MCV: 91.8 fL (ref 78.0–100.0)
Platelets: 163 10*3/uL (ref 150–400)
RBC: 4.63 MIL/uL (ref 3.87–5.11)
RDW: 14.2 % (ref 11.5–15.5)
WBC: 6.4 10*3/uL (ref 4.0–10.5)

## 2016-10-24 MED ORDER — METOPROLOL TARTRATE 50 MG PO TABS: 50.0000 mg | ORAL_TABLET | Freq: Two times a day (BID) | ORAL | 6 refills | Status: DC

## 2016-10-24 MED ORDER — ACETAMINOPHEN 325 MG PO TABS: 650.0000 mg | ORAL_TABLET | ORAL | Status: DC | PRN

## 2016-10-24 MED ORDER — INFLUENZA VAC SPLIT QUAD 0.5 ML IM SUSY
0.5000 mL | PREFILLED_SYRINGE | INTRAMUSCULAR | Status: AC
  Administered 2016-10-24: 0.5 mL via INTRAMUSCULAR
  Filled 2016-10-24: qty 0.5

## 2016-10-24 MED ORDER — DIGOXIN 125 MCG PO TABS
0.1250 mg | ORAL_TABLET | Freq: Every day | ORAL | Status: DC
  Administered 2016-10-24: 0.125 mg via ORAL
  Filled 2016-10-24: qty 1

## 2016-10-24 MED ORDER — DIGOXIN 125 MCG PO TABS: 0.1250 mg | ORAL_TABLET | Freq: Every day | ORAL | 6 refills | Status: DC

## 2016-10-24 MED ORDER — APIXABAN 5 MG PO TABS: 5.0000 mg | ORAL_TABLET | Freq: Two times a day (BID) | ORAL | 0 refills | Status: DC

## 2016-10-24 MED ORDER — PNEUMOCOCCAL VAC POLYVALENT 25 MCG/0.5ML IJ INJ
0.5000 mL | INJECTION | INTRAMUSCULAR | Status: AC
  Administered 2016-10-24: 0.5 mL via INTRAMUSCULAR
  Filled 2016-10-24 (×2): qty 0.5

## 2016-10-24 MED ORDER — APIXABAN 5 MG PO TABS: 5.0000 mg | ORAL_TABLET | Freq: Two times a day (BID) | ORAL | 6 refills | Status: DC

## 2016-10-24 NOTE — Care Management Note (Signed)
Case Management Note Marvetta Gibbons RN, BSN Unit 2W-Case Manager [phone]  Patient Details  Name: [name] MRN: [id number] Date of Birth: August 09, 1951  Subjective/Objective:   Pt admitted with afib                 Action/Plan: PTA pt lived at home- pt started on Eliquis- insurance check completed- [facility] @ [facility] # [phone] OPT- 6   ELIQUIS 5 MG BID 30 / 60 TAB   COVER- YES  CO-PAY- $ 70.00  TIER- 2 DRUG  PRIOR APPROVAL -NO  PHARMACY : [facility]   Spoke with pt at bedside- coverage info shared - pt does have concerns regarding cost - encouraged pt to discuss this with her doctor - 30 day free card given to pt for use on discharge.   Expected Discharge Date:                  Expected Discharge Plan:  Home/Self Care  In-House Referral:     Discharge planning Services  CM Consult, Medication Assistance  Post Acute Care Choice:    Choice offered to:     DME Arranged:    DME Agency:     HH Arranged:    HH Agency:     Status of Service:  In process, will continue to follow  If discussed at Long Length of Stay Meetings, dates discussed:    Additional Comments:  Dawayne Patricia, RN 10/24/2016, 10:39 AM

## 2016-10-24 NOTE — Progress Notes (Signed)
Patient Name: Patricia Wagner Date of Encounter: 10/24/2016  Primary Cardiologist: New to Dr. Ladonna Snide Problem List     Principal Problem:   Atrial fibrillation with rapid ventricular response [facility]) Active Problems:   Essential hypertension   Tobacco abuse   Obesity   CKD (chronic kidney disease), stage III   Acute CHF (congestive heart failure) (HCC)   Atrial fibrillation with RVR (HCC)   Acute systolic congestive heart failure (HCC)   Subjective   Denies chest pain or dyspnea.   Inpatient Medications    Scheduled Meds: . apixaban  5 mg Oral BID  . Influenza vac split quadrivalent PF  0.5 mL Intramuscular Tomorrow-1000  . metoprolol tartrate  50 mg Oral BID  . pneumococcal 23 valent vaccine  0.5 mL Intramuscular Tomorrow-1000  . sodium chloride flush  3 mL Intravenous Q12H  . sodium chloride flush  3 mL Intravenous Q12H  . sodium chloride flush  3 mL Intravenous Q12H   Continuous Infusions:  PRN Meds: sodium chloride, sodium chloride, sodium chloride, acetaminophen, levalbuterol, ondansetron (ZOFRAN) IV, sodium chloride flush, sodium chloride flush, sodium chloride flush   Vital Signs    Vitals:   10/23/16 1635 10/23/16 1642 10/23/16 2113 10/24/16 0650  BP: (!) 143/99 (!) 124/99 102/71 109/86  Pulse: (!) 110  (!) 106 (!) 108  Resp:   18 18  Temp:   98.3 F (36.8 C) 98.4 F (36.9 C)  TempSrc:   Oral Oral  SpO2:   96% 96%  Weight:    239 lb 9.6 oz (108.7 kg)  Height:        Intake/Output Summary (Last 24 hours) at 10/24/16 0952 Last data filed at 10/23/16 1700  Gross per 24 hour  Intake              720 ml  Output              300 ml  Net              420 ml   Filed Weights   10/22/16 0519 10/23/16 0500 10/24/16 0650  Weight: 235 lb 14.4 oz (107 kg) 239 lb 9.6 oz (108.7 kg) 239 lb 9.6 oz (108.7 kg)    Physical Exam    GEN: Well nourished, well developed, in no acute distress.  HEENT: Grossly normal.  Neck: Supple Cardiac: Irregular, trace  lower ext edema Respiratory:  CTA GI: Soft, nontender, nondistended MS: no deformity or atrophy. Skin: warm and dry, no rash. Neuro:  Strength and sensation are intact. Psych: AAOx3.  Normal affect.  Labs    CBC  Recent Labs  10/23/16 0656 10/24/16 0224  WBC 6.3 6.4  HGB 14.3 13.6  HCT 44.1 42.5  MCV 91.7 91.8  PLT 174 789   Basic Metabolic Panel  Recent Labs  10/23/16 0656 10/24/16 0224  NA 138 138  K 3.6 3.9  CL 98* 100*  CO2 29 28  GLUCOSE 105* 98  BUN 20 21*  CREATININE 1.04* 1.08*  CALCIUM 9.1 9.1    Telemetry    afib at rate 100- 110s - Personally Reviewed   Patient Profile     17F with arthritis, chronic BLE edema (R>L, following dog attack and ortho injuries), HTN, obesity, 50 year tobacco history and probable mild CKD who presented to [facility] with DOE, found to be in atrial fib RVR and probable acute CHF. Echo showed severe LV dysfunction; cath showed no obstructive CAD.  Assessment & Plan  1. Atrial fib RVR  - Unknown duration. TSH normal. Rate in 100-110s; BP borderline; continue metoprolol xl 50 BID. Add digoxin 0.125 mg daily. Echocardiogram showed severe LV dysfunction of unknown etiology. Cardiac catheterization showed no obstructive coronary disease. Cardiomyopathy may be tachycardia mediated. Continue Apixaban; I recommended one more day on telemetry to make sure that Patient's heart rate controlled but she requested discharge. I will arrange for her to be seen in the office by an extender next week. If her heart rate is controlled and she remains asymptomatic we can arrange cardioversion 4 weeks from now. If her heart rate is not controlled or she is having worsening CHF symptoms would arrange outpatient TEE guided cardioversion. Hopefully reestablishing sinus rhythm will cause her LV function to normalize.  2. HTN  -  blood pressure controlled. Continue present medications.  3. Transaminitis-will need follow-up with primary care.  4.  Tobacco abuse   - Advised cessation.   5.  cardiomyopathy-etiology unknown as described above. Catheterization showed no obstructive coronary disease. May be tachycardia mediated. Continue metoprolol (convert to toprol); add dig. Add ACE inhibitor later as blood pressure allows.   Pt with fU PA OV next week and with me in 6 weeks.  > 30 min PA and physician time D2  Signed, Kirk Ruths, MD  10/24/2016, 9:52 AM

## 2016-10-24 NOTE — Discharge Summary (Signed)
Discharge Summary    Patient ID: Patricia Wagner,  MRN: [id number], DOB/AGE: Dec 05, 1950 65 y.o.  Admit date: 10/20/2016 Discharge date: 10/24/2016  Primary Care Provider: Cathlean Cower Primary Cardiologist: Dr. [name]  Discharge Diagnoses    Principal Problem:   Atrial fibrillation with rapid ventricular response [facility]) Active Problems:   Acute systolic congestive heart failure (HCC)   NICM (nonischemic cardiomyopathy) ([place])   Essential hypertension   Tobacco abuse   Obesity   CKD (chronic kidney disease), stage III   Acute CHF (congestive heart failure) (HCC)   Atrial fibrillation with RVR ([place])   CAD in native artery, s/p cardiac cath with non obstructive CAD   Allergies Allergies  Allergen Reactions  . Ace Inhibitors Palpitations    Diagnostic Studies/Procedures    10/23/16  Procedures   Left Heart Cath and Coronary Angiography  Conclusion   Conclusions: 1. Mild, non-obstructive coronary artery disease consistent with non-ischemic cardiomyopathy. 2. Mildly elevated left ventricular filling pressure.  Recommendations: 1. Continue medical treatment of non-ischemic cardiomyopathy and atrial fibrillation with rapid ventricular response. 2. Primary prevention to prevent progression of mild CAD. 3. Restart apixaban at 2200 tonight if right radial arteriotomy site is benign with evidence of bleeding.  [name], MD [facility] Pager: [phone]    ECHO 10/21/16   Study Conclusions  - Left ventricle: The cavity size was normal. Wall thickness was   normal. Systolic function was severely reduced. The estimated   ejection fraction was in the range of 20% to 25%. Akinesis of the   basal-midanteroseptal myocardium. - Mitral valve: There was mild regurgitation. - Left atrium: The atrium was severely dilated. - Right atrium: The atrium was moderately dilated. _____________   History of Present Illness     65 y.o. female with history of  arthritis, chronic BLE edema (R>L, following dog attack and ortho injuries), HTN, obesity, 50 year tobacco history, hyperlipidemia, and probable mild CKD who presented to [facility] with 1 day history of exertional DOE on 10/20/16.   She felt fine over the weekend, able to do activities as normal. She works as a Glass blower/designer and has usually been able to perform her job without limitation. Night before admit she noticed she had a dry cough so took some Robitussin to help mobilize the chest congestion. This morning she noticed while at work every time she went to walk around she got noticeably SOB. This was very unusual for her but kept occurring even with slight activity. As a result she went to urgent care where she was noted to be in rapid atrial fib RVR 150's. She was subsequently txf to [facility] where she received 255m IV diltiazem bolus and subsequent gtt with HR improved to 90s-100. She presently denies SOB at rest but has not been up moving around again. No chest pain, nausea, vomiting, diaphoresis, bleeding, syncope, or palpitations - states, "I only could notice how short of breath I was." CXR shows cardiomegaly and interstitial edema. Labs show Cr 1.2 (c/w prior), K 3.7, Hgb 13.4, troponin 0.06, TSH wnl. She reports chronic edema actually looks better than usual. Reports 50 year tobacco hx ("I quit today"), occasional social EtOH without regularity, denies illicit drug use. + Fam hx CAD in father, presumed MI in his 711s Twin sister has had some sort of heart issue for years but she's not sure. No recent travel, surgery, bedrest, or family history/personal history of clot.    She was admitted to tele.  Hospital Course  Consultants: none  Elevated troponin with flat trend.  Felt to be due to acute HF.  BNP was 998, and given IV lasix.  Also with transaminitis statin held.  HR had improved.  Though rate did increase with ambulation.  Her echo revealed EF 20-25% and severely dilatated LA.  She then  underwent cardiac cath.  Only mild non obstructive CAD.  NICM. Cardiomyopathy may be tachycardia mediated.   Pt was placed on eliquis for CHA2DS2VASc of 4.    Pt at discharge is negative 901. And wt 239 down from 243 on admit.    She was seen by DR. [name] and it was recommended for her to stay one other day to ensure rate control.  Pt was adamant about discharge.  She will be seen next week with APP and Dr. [name] in 6 weeks.   If her heart rate is controlled and she remains asymptomatic we can arrange cardioversion 4 weeks from now. If her heart rate is not controlled or she is having worsening CHF symptoms would arrange outpatient TEE guided cardioversion. Hopefully reestablishing sinus rhythm will cause her LV function to normalize.    Pt on BB and dig. Plan to add ACE as outpt. Once back in SR and meds titrated plan to repeat Echo in 3 months.      _____________  Discharge Vitals Blood pressure (!) 117/92, pulse (!) 129, temperature 98.4 F (36.9 C), temperature source Oral, resp. rate 18, height 5' 5" (1.651 m), weight 239 lb 9.6 oz (108.7 kg), SpO2 96 %.  Filed Weights   10/22/16 0519 10/23/16 0500 10/24/16 0650  Weight: 235 lb 14.4 oz (107 kg) 239 lb 9.6 oz (108.7 kg) 239 lb 9.6 oz (108.7 kg)    Labs & Radiologic Studies    CBC  Recent Labs  10/23/16 0656 10/24/16 0224  WBC 6.3 6.4  HGB 14.3 13.6  HCT 44.1 42.5  MCV 91.7 91.8  PLT 174 409   Basic Metabolic Panel  Recent Labs  10/23/16 0656 10/24/16 0224  NA 138 138  K 3.6 3.9  CL 98* 100*  CO2 29 28  GLUCOSE 105* 98  BUN 20 21*  CREATININE 1.04* 1.08*  CALCIUM 9.1 9.1   Liver Function Tests Hepatic Function Latest Ref Rng & Units 10/20/2016 10/25/2015 05/03/2013  Total Protein 6.5 - 8.1 g/dL 6.5 7.8 7.5  Albumin 3.5 - 5.0 g/dL 3.2(L) 3.9 3.6  AST 15 - 41 U/L 69(H) 13 19  ALT 14 - 54 U/L 113(H) 11 19  Alk Phosphatase 38 - 126 U/L 92 61 49  Total Bilirubin 0.3 - 1.2 mg/dL 1.2 0.6 0.9  Bilirubin, Direct  0.1 - 0.5 mg/dL 0.2 0.1 0.1    No results for input(s): LIPASE, AMYLASE in the last 72 hours. Cardiac Enzymes No results for input(s): CKTOTAL, CKMB, CKMBINDEX, TROPONINI in the last 72 hours.   Troponin 0.05 X 3  BNP BNP (last 3 results)  Recent Labs  10/20/16 1731  BNP 998.6*    D-Dimer No results for input(s): DDIMER in the last 72 hours. Hemoglobin A1C No results for input(s): HGBA1C in the last 72 hours. Fasting Lipid Panel Lipid Panel     Component Value Date/Time   CHOL 153 10/21/2016 0307   TRIG 58 10/21/2016 0307   HDL 46 10/21/2016 0307   CHOLHDL 3.3 10/21/2016 0307   VLDL 12 10/21/2016 0307   LDLCALC 95 10/21/2016 0307   LDLDIRECT 121.6 03/15/2012 1646   Thyroid Function Tests No results  for input(s): TSH, T4TOTAL, T3FREE, THYROIDAB in the last 72 hours.  Invalid input(s): FREET3 _____________  Dg Chest Port 1 View  Result Date: 10/20/2016 CLINICAL DATA:  Atrial fibrillation and shortness of breath. EXAM: PORTABLE CHEST 1 VIEW COMPARISON:  05/04/2008 FINDINGS: The heart is enlarged. There is aortic atherosclerosis. There is pulmonary venous hypertension with early interstitial edema. No focal pulmonary lesion. No acute bone finding. IMPRESSION: Cardiomegaly and interstitial edema. Electronically Signed   By: Nelson Chimes M.D.   On: 10/20/2016 19:27   Disposition   Pt is being discharged home today in good condition.  Follow-up Plans & Appointments   Weigh daily Call [phone] if weight climbs more than 3 pounds in a day or 5 pounds in a week. No salt to very little salt in your diet.  No more than 2000 mg in a day. Call if increased shortness of breath or increased swelling.   Call [facility] at [phone]  if any bleeding, swelling or drainage at cath site.  May shower, no tub baths for 48 hours for groin sticks. No lifting over 5 pounds for 3 days.  No Driving for 3 days  You will be seen next week in the office.   Please call Monday and ask for the appointment.     Follow-up Information    Kirk Ruths, MD Follow up.   Specialty:  Cardiology Contact information: [address] [place] [address] [phone]        [place], [place] Follow up.   Specialty:  Cardiology Contact information: [place] [address] [phone]            Discharge Medications   Current Discharge Medication List    START taking these medications   Details  acetaminophen (TYLENOL) 325 MG tablet Take 2 tablets (650 mg total) by mouth every 4 (four) hours as needed for headache or mild pain.    apixaban (ELIQUIS) 5 MG TABS tablet Take 1 tablet (5 mg total) by mouth 2 (two) times daily. Qty: 60 tablet, Refills: 6    digoxin (LANOXIN) 0.125 MG tablet Take 1 tablet (0.125 mg total) by mouth daily. Qty: 30 tablet, Refills: 6    metoprolol (LOPRESSOR) 50 MG tablet Take 1 tablet (50 mg total) by mouth 2 (two) times daily. Qty: 60 tablet, Refills: 6      CONTINUE these medications which have NOT CHANGED   Details  rosuvastatin (CRESTOR) 10 MG tablet Take 1 tablet (10 mg total) by mouth daily. Qty: 90 tablet, Refills: 3      STOP taking these medications     aspirin 81 MG EC tablet      furosemide (LASIX) 20 MG tablet      triamterene-hydrochlorothiazide (MAXZIDE) 75-50 MG tablet          Aspirin prescribed at discharge?  No: NA High Intensity Statin Prescribed? (Lipitor 40-51m or Crestor 20-438m: No: NA Beta Blocker Prescribed? Yes For EF <40%, was ACEI/ARB Prescribed? No: lower BP, will add as outpt. ADP Receptor Inhibitor Prescribed? (i.e. Plavix etc.-Includes Medically Managed Patients): No: NA For EF <40%, Aldosterone Inhibitor Prescribed? No: may be added as outpt Was EF assessed during THIS hospitalization? Yes Was Cardiac Rehab II ordered? (Included Medically managed Patients): No: pt wished to be discharged, will discuss as outpt.   Minimal CAD   Outstanding Labs/Studies   Repeat CMP as outpt.  please  Duration of Discharge Encounter   Greater than 30  minutes including physician time.  Signed, Cecilie Kicks NP 10/24/2016, 12:01 PM

## 2016-10-28 ENCOUNTER — Ambulatory Visit (INDEPENDENT_AMBULATORY_CARE_PROVIDER_SITE_OTHER): Payer: Managed Care, Other (non HMO) | Admitting: Physician Assistant

## 2016-10-28 ENCOUNTER — Encounter: Payer: Self-pay | Admitting: Physician Assistant

## 2016-10-28 ENCOUNTER — Encounter: Payer: Self-pay | Admitting: *Deleted

## 2016-10-28 VITALS — BP 138/94 | HR 108 | Ht 65.0 in | Wt 249.0 lb

## 2016-10-28 DIAGNOSIS — I428 Other cardiomyopathies: Secondary | ICD-10-CM | POA: Diagnosis not present

## 2016-10-28 DIAGNOSIS — Z01812 Encounter for preprocedural laboratory examination: Secondary | ICD-10-CM | POA: Diagnosis not present

## 2016-10-28 DIAGNOSIS — I4891 Unspecified atrial fibrillation: Secondary | ICD-10-CM | POA: Diagnosis not present

## 2016-10-28 DIAGNOSIS — R7401 Elevation of levels of liver transaminase levels: Secondary | ICD-10-CM

## 2016-10-28 DIAGNOSIS — I10 Essential (primary) hypertension: Secondary | ICD-10-CM

## 2016-10-28 DIAGNOSIS — I5022 Chronic systolic (congestive) heart failure: Secondary | ICD-10-CM

## 2016-10-28 DIAGNOSIS — R740 Nonspecific elevation of levels of transaminase and lactic acid dehydrogenase [LDH]: Secondary | ICD-10-CM

## 2016-10-28 LAB — COMPREHENSIVE METABOLIC PANEL
ALBUMIN: 3.5 g/dL — AB (ref 3.6–5.1)
ALT: 58 U/L — ABNORMAL HIGH (ref 6–29)
AST: 36 U/L — AB (ref 10–35)
Alkaline Phosphatase: 64 U/L (ref 33–130)
BILIRUBIN TOTAL: 0.7 mg/dL (ref 0.2–1.2)
BUN: 22 mg/dL (ref 7–25)
CHLORIDE: 104 mmol/L (ref 98–110)
CO2: 34 mmol/L — AB (ref 20–31)
CREATININE: 1.24 mg/dL — AB (ref 0.50–0.99)
Calcium: 8.9 mg/dL (ref 8.6–10.4)
GLUCOSE: 95 mg/dL (ref 65–99)
Potassium: 4.2 mmol/L (ref 3.5–5.3)
SODIUM: 142 mmol/L (ref 135–146)
Total Protein: 6.6 g/dL (ref 6.1–8.1)

## 2016-10-28 LAB — CBC WITH DIFFERENTIAL/PLATELET
BASOS ABS: 62 {cells}/uL (ref 0–200)
BASOS PCT: 1 %
EOS ABS: 62 {cells}/uL (ref 15–500)
Eosinophils Relative: 1 %
HEMATOCRIT: 44.1 % (ref 35.0–45.0)
Hemoglobin: 14 g/dL (ref 11.7–15.5)
LYMPHS PCT: 13 %
Lymphs Abs: 806 cells/uL — ABNORMAL LOW (ref 850–3900)
MCH: 29.3 pg (ref 27.0–33.0)
MCHC: 31.7 g/dL — AB (ref 32.0–36.0)
MCV: 92.3 fL (ref 80.0–100.0)
MONO ABS: 496 {cells}/uL (ref 200–950)
MONOS PCT: 8 %
MPV: 11.1 fL (ref 7.5–12.5)
NEUTROS PCT: 77 %
Neutro Abs: 4774 cells/uL (ref 1500–7800)
PLATELETS: 192 10*3/uL (ref 140–400)
RBC: 4.78 MIL/uL (ref 3.80–5.10)
RDW: 13.6 % (ref 11.0–15.0)
WBC: 6.2 10*3/uL (ref 3.8–10.8)

## 2016-10-28 LAB — PROTIME-INR
INR: 1.1
Prothrombin Time: 11.2 s (ref 9.0–11.5)

## 2016-10-28 NOTE — Patient Instructions (Signed)
Medication Instructions:   Your physician recommends that you continue on your current medications as directed. Please refer to the Current Medication list given to you today.   Labwork:  TODAY--PRE CARDIOVERSION LABS ---CMET, CBC W DIFF, AND PT/INR    Testing/Procedures:  Your physician has requested that you have a TEE/Cardioversion. During a TEE, sound waves are used to create images of your heart. It provides your doctor with information about the size and shape of your heart and how well your heart's chambers and valves are working. In this test, a transducer is attached to the end of a flexible tube that is guided down you throat and into your esophagus (the tube leading from your mouth to your stomach) to get a more detailed image of your heart. Once the TEE has determined that a blood clot is not present, the cardioversion begins. Electrical Cardioversion uses a jolt of electricity to your heart either through paddles or wired patches attached to your chest. This is a controlled, usually prescheduled, procedure. This procedure is done at the hospital and you are not awake during the procedure. You usually go home the day of the procedure. Please see the instruction sheet given to you today for more information.  YOUR CARDIOVERSION IS SCHEDULED FOR THIS Friday 10/31/16 AT 11 AM AT [place] [facility]--SHORT STAY BY 9:30 AM THE MORNING OF THIS PROCEDURE.     Follow-Up:  2 WEEKS WITH AN EXTENDER OVER AT OUR [facility] OFFICE, FOR THIS IS A DR [name] PATIENT---THIS IS FOR POST-CARDIOVERSION FOLLOW-UP PER VIN.   .     If you need a refill on your cardiac medications before your next appointment, please call your pharmacy.

## 2016-10-28 NOTE — Progress Notes (Signed)
Cardiology Office Note    Date:  10/28/2016   ID:  Patricia Wagner, DOB 1951/07/21, MRN [id number]  PCP:  Cathlean Cower, Wagner  Cardiologist:  Dr. Evelena Asa  Chief Complaint: Hospital follow up for afib and ICM  Patient Profile     84F with arthritis, chronic BLE edema (R>L, following dog attack and ortho injuries), HTN, obesity, 50 year tobacco history and probable mild CKD and recent admission who presented for follow up.   On admission her BNP was 998 and also found to have afib with RVR. Held statin due to transaminitis. HR had improved.  Though rate did increase with ambulation.  Her echo revealed EF 20-25% and severely dilatated LA.  She then underwent cardiac cath.  Only mild non obstructive CAD.  NICM. Cardiomyopathy may be tachycardia mediated.   Pt was placed on eliquis for CHA2DS2VASc of 4. Pt at discharge is negative 901. And wt 239 down from 243 on admit.  If her heart rate is controlled and she remains asymptomatic we can arrange cardioversion 4 weeks from now. If her heart rate is not controlled or she is having worsening CHF symptoms would arrange outpatient TEE guided cardioversion. Hopefully reestablishing sinus rhythm will cause herLV function to normalize.  Pt on BB and dig. Plan to add ACE as outpt. Once back in SR and meds titrated plan to repeat Echo in 3 months.    Here today for follow up. Still have dyspnea with exertion. She only able to walks less than one block before she gets dyspneic. Admits to have LE Edema. No palpitation, chest pain, dizziness, syncope. Intermittent orthopnea. She is worried about her symptoms when she goes back to work.    Past Medical History:  Diagnosis Date  . Arthritis   . CAD in native artery, s/p cardiac cath with non obstructive CAD 10/24/2016  . DIVERTICULITIS, HX OF 07/25/2007  . DIVERTICULOSIS, COLON 07/22/2007  . Edema, peripheral    a. chronic BLE edema, R>L. Prior trauma from dog attack and accident.  Marland Kitchen HYPERLIPIDEMIA 02/03/2008  .  Hypersomnia    declines w/u  . Hypertension   . MENOPAUSAL DISORDER 01/09/2011  . Morbid obesity ([place]) 07/22/2007  . NICM (nonischemic cardiomyopathy) ([place]) 10/24/2016  . Raynaud's syndrome 07/22/2007  . THYROID NODULE, RIGHT 01/04/2010  . VITAMIN D DEFICIENCY 01/09/2011   Qualifier: Diagnosis of  By: Patricia Wagner, Patricia Wagner     Past Surgical History:  Procedure Laterality Date  . CARDIAC CATHETERIZATION N/A 10/23/2016   Procedure: Left Heart Cath and Coronary Angiography;  Surgeon: [name], Wagner;  Location: [facility];  Service: Cardiovascular;  Laterality: N/A;  . CHOLECYSTECTOMY    . COLONOSCOPY    . PARTIAL HYSTERECTOMY     1 OVARY LEFT  . POLYPECTOMY      Current Medications: Prior to Admission medications   Medication Sig Start Date End Date Taking? Authorizing Provider  acetaminophen (TYLENOL) 325 MG tablet Take 2 tablets (650 mg total) by mouth every 4 (four) hours as needed for headache or mild pain. 10/24/16   Isaiah Serge, NP  apixaban (ELIQUIS) 5 MG TABS tablet Take 1 tablet (5 mg total) by mouth 2 (two) times daily. 10/24/16   Isaiah Serge, NP  digoxin (LANOXIN) 0.125 MG tablet Take 1 tablet (0.125 mg total) by mouth daily. 10/24/16   Isaiah Serge, NP  metoprolol (LOPRESSOR) 50 MG tablet Take 1 tablet (50 mg total) by mouth 2 (two) times daily. 10/24/16  Isaiah Serge, NP  rosuvastatin (CRESTOR) 10 MG tablet Take 1 tablet (10 mg total) by mouth daily. Patient not taking: Reported on 10/20/2016 10/26/15   Biagio Borg, Wagner    Allergies:   Ace inhibitors   Social History   Social History  . Marital status: Single    Spouse name: N/A  . Number of children: 1  . Years of education: N/A   Occupational History  . Printing     Social History Main Topics  . Smoking status: Current Every Day Smoker    Packs/day: 0.50    Types: Cigarettes  . Smokeless tobacco: Never Used     Comment: Smoked for 50 years  . Alcohol use 2.4 oz/week    4 Cans of beer per week       Comment: Intermittent  . Drug use: No  . Sexual activity: Not Asked   Other Topics Concern  . None   Social History Narrative  . None     Family History:  The patient's family history includes Asthma in her mother; Diabetes in her father; Heart disease in her father and sister; Lung disease in her sister.   ROS:   Please see the history of present illness.    ROS All other systems reviewed and are negative.   PHYSICAL EXAM:   VS:  BP (!) 138/94   Pulse (!) 108   Ht 5\' 5"  (1.651 m)   Wt 249 lb (112.9 kg)   BMI 41.44 kg/m    GEN: Well nourished, well developed, in no acute distress  HEENT: normal  Neck: no JVD, carotid bruits, or masses Cardiac: Ir Ir with tachycardia ; no murmurs, rubs, or gallops trace BL LE edema  Respiratory:  clear to auscultation bilaterally, normal work of breathing GI: soft, nontender, nondistended, + BS MS: no deformity or atrophy  Skin: warm and dry, no rash Neuro:  Alert and Oriented x 3, Strength and sensation are intact Psych: euthymic mood, full affect  Wt Readings from Last 3 Encounters:  10/28/16 249 lb (112.9 kg)  10/24/16 239 lb 9.6 oz (108.7 kg)  10/20/16 243 lb (110.2 kg)      Studies/Labs Reviewed:   EKG:  EKG is ordered today.  The ekg ordered today demonstrates afib at rate of 108 bpm  Recent Labs: 10/20/2016: ALT 113; B Natriuretic Peptide 998.6; Magnesium 1.9; TSH 1.007 10/24/2016: BUN 21; Creatinine, Ser 1.08; Hemoglobin 13.6; Platelets 163; Potassium 3.9; Sodium 138   Lipid Panel    Component Value Date/Time   CHOL 153 10/21/2016 0307   TRIG 58 10/21/2016 0307   HDL 46 10/21/2016 0307   CHOLHDL 3.3 10/21/2016 0307   VLDL 12 10/21/2016 0307   LDLCALC 95 10/21/2016 0307   LDLDIRECT 121.6 03/15/2012 1646    Additional studies/ records that were reviewed today include:   10/23/16  Procedures   Left Heart Cath and Coronary Angiography  Conclusion   Conclusions: 1. Mild, non-obstructive coronary artery  disease consistent with non-ischemic cardiomyopathy. 2. Mildly elevated left ventricular filling pressure.  Recommendations: 1. Continue medical treatment of non-ischemic cardiomyopathy and atrial fibrillation with rapid ventricular response. 2. Primary prevention to prevent progression of mild CAD. 3. Restart apixaban at 2200 tonight if right radial arteriotomy site is benign with evidence of bleeding.  [name], Wagner [facility] Pager: [phone]    ECHO 10/21/16   Study Conclusions  - Left ventricle: The cavity size was normal. Wall thickness was normal. Systolic function was  severely reduced. The estimated ejection fraction was in the range of 20% to 25%. Akinesis of the basal-midanteroseptal myocardium. - Mitral valve: There was mild regurgitation. - Left atrium: The atrium was severely dilated. - Right atrium: The atrium was moderately dilated. _____________     ASSESSMENT & PLAN:    1. Afib with RVR - Still in afib at rate of 108. Dyspnea with exertion as well as LE edema. Will get TEE/DCCV this week. Continue Current dose of BB. Continue Eliquis for anticoagulation. No bleeding complications.   2. NICM - Ef of 20-25%. Cath showed non-obstructive CAD. Likely tachy mediated. Elevate legs and she will try compression. She will let us know if worsening symptoms. Repeat echo in 3 months.   3. HTN - Stable and well controlled.   4. Trasaminitis - Held statin in hospital. Will recheck CMP. IF normal LFT -->restart statin.   Medication Adjustments/Labs and Tests Ordered: Current medicines are reviewed at length with the patient today.  Concerns regarding medicines are outlined above.  Medication changes, Labs and Tests ordered today are listed in the Patient Instructions below. There are no Patient Instructions on file for this visit.   Jarrett Soho, [place]  10/28/2016 8:19 AM    [facility] [place],  [place], [place]  [address] Phone: [phone]; Fax: [phone]

## 2016-10-31 ENCOUNTER — Inpatient Hospital Stay (HOSPITAL_COMMUNITY)
Admission: AD | Admit: 2016-10-31 | Discharge: 2016-11-04 | DRG: 308 | Disposition: A | Discharge status: Home / Self Care | Payer: Managed Care, Other (non HMO) | Source: Ambulatory Visit | Attending: Internal Medicine | Admitting: Internal Medicine

## 2016-10-31 ENCOUNTER — Encounter (HOSPITAL_COMMUNITY): Payer: Self-pay

## 2016-10-31 ENCOUNTER — Encounter (HOSPITAL_COMMUNITY)
Admission: AD | Disposition: A | Discharge status: Home / Self Care | Payer: Self-pay | Source: Ambulatory Visit | Attending: Internal Medicine

## 2016-10-31 ENCOUNTER — Ambulatory Visit (HOSPITAL_COMMUNITY): Payer: Managed Care, Other (non HMO) | Admitting: Anesthesiology

## 2016-10-31 ENCOUNTER — Inpatient Hospital Stay (HOSPITAL_COMMUNITY): Payer: Managed Care, Other (non HMO)

## 2016-10-31 DIAGNOSIS — Z7901 Long term (current) use of anticoagulants: Secondary | ICD-10-CM | POA: Diagnosis not present

## 2016-10-31 DIAGNOSIS — Z6841 Body Mass Index (BMI) 40.0 and over, adult: Secondary | ICD-10-CM

## 2016-10-31 DIAGNOSIS — I2510 Atherosclerotic heart disease of native coronary artery without angina pectoris: Secondary | ICD-10-CM | POA: Diagnosis present

## 2016-10-31 DIAGNOSIS — I4891 Unspecified atrial fibrillation: Secondary | ICD-10-CM | POA: Diagnosis not present

## 2016-10-31 DIAGNOSIS — I480 Paroxysmal atrial fibrillation: Secondary | ICD-10-CM

## 2016-10-31 DIAGNOSIS — Z833 Family history of diabetes mellitus: Secondary | ICD-10-CM | POA: Diagnosis not present

## 2016-10-31 DIAGNOSIS — I5023 Acute on chronic systolic (congestive) heart failure: Secondary | ICD-10-CM | POA: Diagnosis present

## 2016-10-31 DIAGNOSIS — E876 Hypokalemia: Secondary | ICD-10-CM | POA: Diagnosis not present

## 2016-10-31 DIAGNOSIS — R0602 Shortness of breath: Secondary | ICD-10-CM | POA: Diagnosis present

## 2016-10-31 DIAGNOSIS — Z836 Family history of other diseases of the respiratory system: Secondary | ICD-10-CM

## 2016-10-31 DIAGNOSIS — I130 Hypertensive heart and chronic kidney disease with heart failure and stage 1 through stage 4 chronic kidney disease, or unspecified chronic kidney disease: Secondary | ICD-10-CM | POA: Diagnosis present

## 2016-10-31 DIAGNOSIS — I472 Ventricular tachycardia: Secondary | ICD-10-CM | POA: Diagnosis present

## 2016-10-31 DIAGNOSIS — F17210 Nicotine dependence, cigarettes, uncomplicated: Secondary | ICD-10-CM | POA: Diagnosis present

## 2016-10-31 DIAGNOSIS — I7300 Raynaud's syndrome without gangrene: Secondary | ICD-10-CM | POA: Diagnosis present

## 2016-10-31 DIAGNOSIS — Z8249 Family history of ischemic heart disease and other diseases of the circulatory system: Secondary | ICD-10-CM | POA: Diagnosis not present

## 2016-10-31 DIAGNOSIS — Z825 Family history of asthma and other chronic lower respiratory diseases: Secondary | ICD-10-CM | POA: Diagnosis not present

## 2016-10-31 DIAGNOSIS — Z79899 Other long term (current) drug therapy: Secondary | ICD-10-CM | POA: Diagnosis not present

## 2016-10-31 DIAGNOSIS — I481 Persistent atrial fibrillation: Secondary | ICD-10-CM | POA: Diagnosis present

## 2016-10-31 DIAGNOSIS — I428 Other cardiomyopathies: Secondary | ICD-10-CM | POA: Diagnosis present

## 2016-10-31 DIAGNOSIS — Z888 Allergy status to other drugs, medicaments and biological substances status: Secondary | ICD-10-CM

## 2016-10-31 DIAGNOSIS — I4819 Other persistent atrial fibrillation: Secondary | ICD-10-CM | POA: Diagnosis present

## 2016-10-31 DIAGNOSIS — I10 Essential (primary) hypertension: Secondary | ICD-10-CM | POA: Diagnosis not present

## 2016-10-31 DIAGNOSIS — E785 Hyperlipidemia, unspecified: Secondary | ICD-10-CM | POA: Diagnosis present

## 2016-10-31 DIAGNOSIS — N183 Chronic kidney disease, stage 3 (moderate): Secondary | ICD-10-CM | POA: Diagnosis present

## 2016-10-31 DIAGNOSIS — I509 Heart failure, unspecified: Secondary | ICD-10-CM

## 2016-10-31 DIAGNOSIS — E559 Vitamin D deficiency, unspecified: Secondary | ICD-10-CM | POA: Diagnosis present

## 2016-10-31 HISTORY — DX: Dyspnea, unspecified: R06.00

## 2016-10-31 HISTORY — PX: TEE WITHOUT CARDIOVERSION: SHX5443

## 2016-10-31 HISTORY — DX: Heart failure, unspecified: I50.9

## 2016-10-31 HISTORY — PX: CARDIOVERSION: SHX1299

## 2016-10-31 LAB — BASIC METABOLIC PANEL
ANION GAP: 9 (ref 5–15)
BUN: 18 mg/dL (ref 6–20)
CALCIUM: 8.9 mg/dL (ref 8.9–10.3)
CO2: 31 mmol/L (ref 22–32)
CREATININE: 1.26 mg/dL — AB (ref 0.44–1.00)
Chloride: 102 mmol/L (ref 101–111)
GFR, EST AFRICAN AMERICAN: 51 mL/min — AB (ref >60)
GFR, EST NON AFRICAN AMERICAN: 44 mL/min — AB (ref >60)
Glucose, Bld: 112 mg/dL — ABNORMAL HIGH (ref 65–99)
Potassium: 3.8 mmol/L (ref 3.5–5.1)
SODIUM: 142 mmol/L (ref 135–145)

## 2016-10-31 LAB — COMPREHENSIVE METABOLIC PANEL
ALK PHOS: 51 U/L (ref 38–126)
ALT: 39 U/L (ref 14–54)
ANION GAP: 6 (ref 5–15)
AST: 21 U/L (ref 15–41)
Albumin: 2.9 g/dL — ABNORMAL LOW (ref 3.5–5.0)
BILIRUBIN TOTAL: 1.1 mg/dL (ref 0.3–1.2)
BUN: 18 mg/dL (ref 6–20)
CALCIUM: 8.5 mg/dL — AB (ref 8.9–10.3)
CO2: 28 mmol/L (ref 22–32)
Chloride: 106 mmol/L (ref 101–111)
Creatinine, Ser: 1.14 mg/dL — ABNORMAL HIGH (ref 0.44–1.00)
GFR calc Af Amer: 58 mL/min — ABNORMAL LOW (ref >60)
GFR, EST NON AFRICAN AMERICAN: 50 mL/min — AB (ref >60)
Glucose, Bld: 118 mg/dL — ABNORMAL HIGH (ref 65–99)
POTASSIUM: 4.1 mmol/L (ref 3.5–5.1)
Sodium: 140 mmol/L (ref 135–145)
TOTAL PROTEIN: 6.4 g/dL — AB (ref 6.5–8.1)

## 2016-10-31 LAB — CBC
HEMATOCRIT: 39.5 % (ref 36.0–46.0)
Hemoglobin: 12.6 g/dL (ref 12.0–15.0)
MCH: 29.7 pg (ref 26.0–34.0)
MCHC: 31.9 g/dL (ref 30.0–36.0)
MCV: 93.2 fL (ref 78.0–100.0)
PLATELETS: 178 10*3/uL (ref 150–400)
RBC: 4.24 MIL/uL (ref 3.87–5.11)
RDW: 14.2 % (ref 11.5–15.5)
WBC: 11.6 10*3/uL — AB (ref 4.0–10.5)

## 2016-10-31 LAB — BRAIN NATRIURETIC PEPTIDE: B Natriuretic Peptide: 1218.8 pg/mL — ABNORMAL HIGH (ref 0.0–100.0)

## 2016-10-31 LAB — MAGNESIUM: MAGNESIUM: 1.9 mg/dL (ref 1.7–2.4)

## 2016-10-31 SURGERY — CARDIOVERSION
Anesthesia: Monitor Anesthesia Care

## 2016-10-31 MED ORDER — FENTANYL CITRATE (PF) 100 MCG/2ML IJ SOLN: 25.0000 ug | INTRAMUSCULAR | Status: DC | PRN

## 2016-10-31 MED ORDER — DIGOXIN 125 MCG PO TABS
0.1250 mg | ORAL_TABLET | Freq: Every day | ORAL | Status: DC
  Administered 2016-11-01 – 2016-11-02 (×2): 0.125 mg via ORAL
  Filled 2016-10-31 (×2): qty 1

## 2016-10-31 MED ORDER — FUROSEMIDE 10 MG/ML IJ SOLN
40.0000 mg | Freq: Two times a day (BID) | INTRAMUSCULAR | Status: DC
  Administered 2016-10-31 – 2016-11-02 (×5): 40 mg via INTRAVENOUS
  Filled 2016-10-31 (×5): qty 4

## 2016-10-31 MED ORDER — ACETAMINOPHEN 325 MG PO TABS
650.0000 mg | ORAL_TABLET | ORAL | Status: DC | PRN
  Administered 2016-10-31: 650 mg via ORAL
  Filled 2016-10-31: qty 2

## 2016-10-31 MED ORDER — SODIUM CHLORIDE 0.9% FLUSH: 3.0000 mL | INTRAVENOUS | Status: DC | PRN

## 2016-10-31 MED ORDER — PROPOFOL 500 MG/50ML IV EMUL
INTRAVENOUS | Status: DC | PRN
  Administered 2016-10-31: 50 ug/kg/min via INTRAVENOUS

## 2016-10-31 MED ORDER — PROPOFOL 10 MG/ML IV BOLUS
INTRAVENOUS | Status: DC | PRN
  Administered 2016-10-31: 20 mg via INTRAVENOUS

## 2016-10-31 MED ORDER — ONDANSETRON HCL 4 MG/2ML IJ SOLN
4.0000 mg | Freq: Four times a day (QID) | INTRAMUSCULAR | Status: DC | PRN
  Administered 2016-11-03: 4 mg via INTRAVENOUS
  Filled 2016-10-31: qty 2

## 2016-10-31 MED ORDER — SODIUM CHLORIDE 0.9% FLUSH
3.0000 mL | Freq: Two times a day (BID) | INTRAVENOUS | Status: DC
  Administered 2016-10-31 – 2016-11-02 (×6): 3 mL via INTRAVENOUS

## 2016-10-31 MED ORDER — SODIUM CHLORIDE 0.9 % IV SOLN
INTRAVENOUS | Status: DC
  Administered 2016-10-31: 500 mL via INTRAVENOUS

## 2016-10-31 MED ORDER — FUROSEMIDE 10 MG/ML IJ SOLN
60.0000 mg | Freq: Once | INTRAMUSCULAR | Status: AC
  Administered 2016-10-31: 60 mg via INTRAVENOUS
  Filled 2016-10-31: qty 6

## 2016-10-31 MED ORDER — APIXABAN 5 MG PO TABS
5.0000 mg | ORAL_TABLET | Freq: Two times a day (BID) | ORAL | Status: DC
  Administered 2016-10-31 – 2016-11-03 (×6): 5 mg via ORAL
  Filled 2016-10-31 (×6): qty 1

## 2016-10-31 MED ORDER — SODIUM CHLORIDE 0.9 % IV SOLN: 250.0000 mL | INTRAVENOUS | Status: DC | PRN

## 2016-10-31 MED ORDER — ROSUVASTATIN CALCIUM 10 MG PO TABS
10.0000 mg | ORAL_TABLET | Freq: Every day | ORAL | Status: DC
  Administered 2016-11-01 – 2016-11-02 (×2): 10 mg via ORAL
  Filled 2016-10-31 (×2): qty 1

## 2016-10-31 MED ORDER — POTASSIUM CHLORIDE IN NACL 20-0.9 MEQ/L-% IV SOLN
INTRAVENOUS | Status: DC
  Administered 2016-10-31: 22:00:00 via INTRAVENOUS
  Filled 2016-10-31: qty 1000

## 2016-10-31 MED ORDER — METOPROLOL TARTRATE 50 MG PO TABS
75.0000 mg | ORAL_TABLET | Freq: Two times a day (BID) | ORAL | Status: DC
  Administered 2016-10-31 – 2016-11-02 (×5): 75 mg via ORAL
  Filled 2016-10-31 (×5): qty 1

## 2016-10-31 MED ORDER — ACETAMINOPHEN 325 MG PO TABS: 650.0000 mg | ORAL_TABLET | ORAL | Status: DC | PRN

## 2016-10-31 MED ORDER — SODIUM CHLORIDE 0.9 % IV SOLN: 250.0000 mL | INTRAVENOUS | Status: DC

## 2016-10-31 MED ORDER — SODIUM CHLORIDE 0.9% FLUSH: 3.0000 mL | Freq: Two times a day (BID) | INTRAVENOUS | Status: DC

## 2016-10-31 MED ORDER — METOPROLOL TARTRATE 50 MG PO TABS
50.0000 mg | ORAL_TABLET | Freq: Two times a day (BID) | ORAL | Status: DC
Start: 2016-10-31 — End: 2016-10-31
  Filled 2016-10-31: qty 1

## 2016-10-31 NOTE — Interval H&P Note (Signed)
History and Physical Interval Note:  10/31/2016 9:23 AM  Patricia Wagner  has presented today for surgery, with the diagnosis of AFIB WITH RVR  The various methods of treatment have been discussed with the patient and family. After consideration of risks, benefits and other options for treatment, the patient has consented to  Procedure(s): CARDIOVERSION (N/A) TRANSESOPHAGEAL ECHOCARDIOGRAM (TEE) (N/A) as a surgical intervention .  The patient's history has been reviewed, patient examined, no change in status, stable for surgery.  I have reviewed the patient's chart and labs.  Questions were answered to the patient's satisfaction.     [name]

## 2016-10-31 NOTE — Progress Notes (Signed)
Pt back in Atrial fibrillation at 1349. Eugene Garnet, NP text paged  And updated with findings. EKg to follow

## 2016-10-31 NOTE — Progress Notes (Signed)
Pt went back into afib with rates 100s to 120s   Will increase b blocker Treat CHF   Check labs in AM  K, MG) Reviewed with EP  Would recomm Tikosyn.  Check labs in am after diuresis. If still in afib after the weekend then cardiovert again.

## 2016-10-31 NOTE — Anesthesia Preprocedure Evaluation (Addendum)
Anesthesia Evaluation  Patient identified by MRN, date of birth, ID band Patient awake    Reviewed: Allergy & Precautions, H&P , Patient's Chart, lab work & pertinent test results, reviewed documented beta blocker date and time   Airway Mallampati: II  TM Distance: >3 FB Neck ROM: full    Dental no notable dental hx.    Pulmonary Current Smoker,    Pulmonary exam normal breath sounds clear to auscultation       Cardiovascular hypertension, +CHF   Rhythm:regular Rate:Normal     Neuro/Psych    GI/Hepatic   Endo/Other    Renal/GU      Musculoskeletal   Abdominal   Peds  Hematology   Anesthesia Other Findings  Echo..... Systolic function was severely reduced. The estimated   ejection fraction was in the range of 20% to 25%.   Reproductive/Obstetrics                            Anesthesia Physical Anesthesia Plan  ASA: III  Anesthesia Plan:    Post-op Pain Management:    Induction: Intravenous  Airway Management Planned: Mask  Additional Equipment:   Intra-op Plan:   Post-operative Plan:   Informed Consent: I have reviewed the patients History and Physical, chart, labs and discussed the procedure including the risks, benefits and alternatives for the proposed anesthesia with the patient or authorized representative who has indicated his/her understanding and acceptance.   Dental Advisory Given and Dental advisory given  Plan Discussed with: CRNA and Surgeon  Anesthesia Plan Comments: (Has low grade fever. Some SOB. 99 % RA sat; no wheezes or rales. No productive cough Discussed GA with LMA possible ; included sore throat, potential need to switch to ETT, N/V, pulmonary aspiration. Questions answered. )       Anesthesia Quick Evaluation

## 2016-10-31 NOTE — Op Note (Signed)
Full report to follow in CV section of chart LVEF 20% LA, LAA without masses

## 2016-10-31 NOTE — Anesthesia Postprocedure Evaluation (Signed)
Anesthesia Post Note  Patient: MALANI LEES  Procedure(s) Performed: Procedure(s) (LRB): CARDIOVERSION (N/A) TRANSESOPHAGEAL ECHOCARDIOGRAM (TEE) (N/A)  Patient location during evaluation: PACU Anesthesia Type: General Level of consciousness: sedated Pain management: satisfactory to patient Vital Signs Assessment: post-procedure vital signs reviewed and stable Respiratory status: spontaneous breathing Cardiovascular status: stable Anesthetic complications: no Comments: Resp status returned to pre-op' pt alert and conversant    Last Vitals:  Vitals:   10/31/16 1220 10/31/16 1230  BP: (!) 152/96 (!) 139/97  Pulse: 74 72  Resp: (!) 29 (!) 33  Temp:      Last Pain:  Vitals:   10/31/16 1151  TempSrc: Oral                 Krishawna Stiefel EDWARD

## 2016-10-31 NOTE — Progress Notes (Signed)
  Echocardiogram Echocardiogram Transesophageal has been performed.  Patricia Wagner 10/31/2016, 11:35 AM

## 2016-10-31 NOTE — Anesthesia Procedure Notes (Signed)
Procedure Name: MAC Date/Time: 10/31/2016 11:19 AM Performed by: Willeen Cass P Pre-anesthesia Checklist: Patient identified, Emergency Drugs available, Suction available, Patient being monitored and Timeout performed Patient Re-evaluated:Patient Re-evaluated prior to inductionOxygen Delivery Method: Nasal cannula Intubation Type: IV induction Placement Confirmation: positive ETCO2 Dental Injury: Teeth and Oropharynx as per pre-operative assessment

## 2016-10-31 NOTE — Transfer of Care (Signed)
Immediate Anesthesia Transfer of Care Note  Patient: Patricia Wagner  Procedure(s) Performed: Procedure(s): CARDIOVERSION (N/A) TRANSESOPHAGEAL ECHOCARDIOGRAM (TEE) (N/A)  Patient Location: PACU and Endoscopy Unit  Anesthesia Type:MAC  Level of Consciousness: awake, alert , oriented and patient cooperative  Airway & Oxygen Therapy: Patient Spontanous Breathing and Patient connected to nasal cannula oxygen  Post-op Assessment: Report given to RN and Post -op Vital signs reviewed and stable  Post vital signs: Reviewed and stable  Last Vitals:  Vitals:   10/31/16 1220 10/31/16 1230  BP: (!) 152/96 (!) 139/97  Pulse: 74 72  Resp: (!) 29 (!) 33  Temp:      Last Pain:  Vitals:   10/31/16 1151  TempSrc: Oral         Complications: No apparent anesthesia complications

## 2016-10-31 NOTE — H&P (View-Only) (Signed)
Cardiology Office Note    Date:  10/28/2016   ID:  Patricia Wagner, DOB [birth year], MRN [id number]  PCP:  Cathlean Cower, MD  Cardiologist:  Dr. Evelena Asa  Chief Complaint: Hospital follow up for afib and ICM  Patient Profile     55F with arthritis, chronic BLE edema (R>L, following dog attack and ortho injuries), HTN, obesity, 50 year tobacco history and probable mild CKD and recent admission who presented for follow up.   On admission her BNP was 998 and also found to have afib with RVR. Held statin due to transaminitis. HR had improved.  Though rate did increase with ambulation.  Her echo revealed EF 20-25% and severely dilatated LA.  She then underwent cardiac cath.  Only mild non obstructive CAD.  NICM. Cardiomyopathy may be tachycardia mediated.   Pt was placed on eliquis for CHA2DS2VASc of 4. Pt at discharge is negative 901. And wt 239 down from 243 on admit.  If her heart rate is controlled and she remains asymptomatic we can arrange cardioversion 4 weeks from now. If her heart rate is not controlled or she is having worsening CHF symptoms would arrange outpatient TEE guided cardioversion. Hopefully reestablishing sinus rhythm will cause herLV function to normalize.  Pt on BB and dig. Plan to add ACE as outpt. Once back in SR and meds titrated plan to repeat Echo in 3 months.    Here today for follow up. Still have dyspnea with exertion. She only able to walks less than one block before she gets dyspneic. Admits to have LE Edema. No palpitation, chest pain, dizziness, syncope. Intermittent orthopnea. She is worried about her symptoms when she goes back to work.    Past Medical History:  Diagnosis Date  . Arthritis   . CAD in native artery, s/p cardiac cath with non obstructive CAD 10/24/2016  . DIVERTICULITIS, HX OF 07/25/2007  . DIVERTICULOSIS, COLON 07/22/2007  . Edema, peripheral    a. chronic BLE edema, R>L. Prior trauma from dog attack and accident.  Marland Kitchen HYPERLIPIDEMIA 02/03/2008  .  Hypersomnia    declines w/u  . Hypertension   . MENOPAUSAL DISORDER 01/09/2011  . Morbid obesity ([place]) 07/22/2007  . NICM (nonischemic cardiomyopathy) ([place]) 10/24/2016  . Raynaud's syndrome 07/22/2007  . THYROID NODULE, RIGHT 01/04/2010  . VITAMIN D DEFICIENCY 01/09/2011   Qualifier: Diagnosis of  By: [name], [name]     Past Surgical History:  Procedure Laterality Date  . CARDIAC CATHETERIZATION N/A 10/23/2016   Procedure: Left Heart Cath and Coronary Angiography;  Surgeon: [name], MD;  Location: [facility];  Service: Cardiovascular;  Laterality: N/A;  . CHOLECYSTECTOMY    . COLONOSCOPY    . PARTIAL HYSTERECTOMY     1 OVARY LEFT  . POLYPECTOMY      Current Medications: Prior to Admission medications   Medication Sig Start Date End Date Taking? Authorizing Provider  acetaminophen (TYLENOL) 325 MG tablet Take 2 tablets (650 mg total) by mouth every 4 (four) hours as needed for headache or mild pain. 10/24/16   Isaiah Serge, NP  apixaban (ELIQUIS) 5 MG TABS tablet Take 1 tablet (5 mg total) by mouth 2 (two) times daily. 10/24/16   Isaiah Serge, NP  digoxin (LANOXIN) 0.125 MG tablet Take 1 tablet (0.125 mg total) by mouth daily. 10/24/16   Isaiah Serge, NP  metoprolol (LOPRESSOR) 50 MG tablet Take 1 tablet (50 mg total) by mouth 2 (two) times daily. 10/24/16  Isaiah Serge, NP  rosuvastatin (CRESTOR) 10 MG tablet Take 1 tablet (10 mg total) by mouth daily. Patient not taking: Reported on 10/20/2016 10/26/15   Biagio Borg, MD    Allergies:   Ace inhibitors   Social History   Social History  . Marital status: Single    Spouse name: N/A  . Number of children: 1  . Years of education: N/A   Occupational History  . Printing     Social History Main Topics  . Smoking status: Current Every Day Smoker    Packs/day: 0.50    Types: Cigarettes  . Smokeless tobacco: Never Used     Comment: Smoked for 50 years  . Alcohol use 2.4 oz/week    4 Cans of beer per week       Comment: Intermittent  . Drug use: No  . Sexual activity: Not Asked   Other Topics Concern  . None   Social History Narrative  . None     Family History:  The patient's family history includes Asthma in her mother; Diabetes in her father; Heart disease in her father and sister; Lung disease in her sister.   ROS:   Please see the history of present illness.    ROS All other systems reviewed and are negative.   PHYSICAL EXAM:   VS:  BP (!) 138/94   Pulse (!) 108   Ht 5\' 5"  (1.651 m)   Wt 249 lb (112.9 kg)   BMI 41.44 kg/m    GEN: Well nourished, well developed, in no acute distress  HEENT: normal  Neck: no JVD, carotid bruits, or masses Cardiac: Ir Ir with tachycardia ; no murmurs, rubs, or gallops trace BL LE edema  Respiratory:  clear to auscultation bilaterally, normal work of breathing GI: soft, nontender, nondistended, + BS MS: no deformity or atrophy  Skin: warm and dry, no rash Neuro:  Alert and Oriented x 3, Strength and sensation are intact Psych: euthymic mood, full affect  Wt Readings from Last 3 Encounters:  10/28/16 249 lb (112.9 kg)  10/24/16 239 lb 9.6 oz (108.7 kg)  10/20/16 243 lb (110.2 kg)      Studies/Labs Reviewed:   EKG:  EKG is ordered today.  The ekg ordered today demonstrates afib at rate of 108 bpm  Recent Labs: 10/20/2016: ALT 113; B Natriuretic Peptide 998.6; Magnesium 1.9; TSH 1.007 10/24/2016: BUN 21; Creatinine, Ser 1.08; Hemoglobin 13.6; Platelets 163; Potassium 3.9; Sodium 138   Lipid Panel    Component Value Date/Time   CHOL 153 10/21/2016 0307   TRIG 58 10/21/2016 0307   HDL 46 10/21/2016 0307   CHOLHDL 3.3 10/21/2016 0307   VLDL 12 10/21/2016 0307   LDLCALC 95 10/21/2016 0307   LDLDIRECT 121.6 03/15/2012 1646    Additional studies/ records that were reviewed today include:   10/23/16  Procedures   Left Heart Cath and Coronary Angiography  Conclusion   Conclusions: 1. Mild, non-obstructive coronary artery  disease consistent with non-ischemic cardiomyopathy. 2. Mildly elevated left ventricular filling pressure.  Recommendations: 1. Continue medical treatment of non-ischemic cardiomyopathy and atrial fibrillation with rapid ventricular response. 2. Primary prevention to prevent progression of mild CAD. 3. Restart apixaban at 2200 tonight if right radial arteriotomy site is benign with evidence of bleeding.  [name], MD [facility] Pager: [phone]    ECHO 10/21/16   Study Conclusions  - Left ventricle: The cavity size was normal. Wall thickness was normal. Systolic function was  severely reduced. The estimated ejection fraction was in the range of 20% to 25%. Akinesis of the basal-midanteroseptal myocardium. - Mitral valve: There was mild regurgitation. - Left atrium: The atrium was severely dilated. - Right atrium: The atrium was moderately dilated. _____________     ASSESSMENT & PLAN:    1. Afib with RVR - Still in afib at rate of 108. Dyspnea with exertion as well as LE edema. Will get TEE/DCCV this week. Continue Current dose of BB. Continue Eliquis for anticoagulation. No bleeding complications.   2. NICM - Ef of 20-25%. Cath showed non-obstructive CAD. Likely tachy mediated. Elevate legs and she will try compression. She will let us know if worsening symptoms. Repeat echo in 3 months.   3. HTN - Stable and well controlled.   4. Trasaminitis - Held statin in hospital. Will recheck CMP. IF normal LFT -->restart statin.   Medication Adjustments/Labs and Tests Ordered: Current medicines are reviewed at length with the patient today.  Concerns regarding medicines are outlined above.  Medication changes, Labs and Tests ordered today are listed in the Patient Instructions below. There are no Patient Instructions on file for this visit.   Jarrett Soho, [place]  10/28/2016 8:19 AM    [facility] [place],  [place], [place]  [address] Phone: [phone]; Fax: [phone]

## 2016-10-31 NOTE — H&P (View-Only) (Signed)
Patient Name: Patricia Wagner Date of Encounter: 10/24/2016  Primary Cardiologist: New to Dr. Ladonna Wagner Problem List     Principal Problem:   Atrial fibrillation with rapid ventricular response [facility]) Active Problems:   Essential hypertension   Tobacco abuse   Obesity   CKD (chronic kidney disease), stage III   Acute CHF (congestive heart failure) (HCC)   Atrial fibrillation with RVR (HCC)   Acute systolic congestive heart failure (HCC)   Subjective   Denies chest pain or dyspnea.   Inpatient Medications    Scheduled Meds: . apixaban  5 mg Oral BID  . Influenza vac split quadrivalent PF  0.5 mL Intramuscular Tomorrow-1000  . metoprolol tartrate  50 mg Oral BID  . pneumococcal 23 valent vaccine  0.5 mL Intramuscular Tomorrow-1000  . sodium chloride flush  3 mL Intravenous Q12H  . sodium chloride flush  3 mL Intravenous Q12H  . sodium chloride flush  3 mL Intravenous Q12H   Continuous Infusions:  PRN Meds: sodium chloride, sodium chloride, sodium chloride, acetaminophen, levalbuterol, ondansetron (ZOFRAN) IV, sodium chloride flush, sodium chloride flush, sodium chloride flush   Vital Signs    Vitals:   10/23/16 1635 10/23/16 1642 10/23/16 2113 10/24/16 0650  BP: (!) 143/99 (!) 124/99 102/71 109/86  Pulse: (!) 110  (!) 106 (!) 108  Resp:   18 18  Temp:   98.3 F (36.8 C) 98.4 F (36.9 C)  TempSrc:   Oral Oral  SpO2:   96% 96%  Weight:    239 lb 9.6 oz (108.7 kg)  Height:        Intake/Output Summary (Last 24 hours) at 10/24/16 0952 Last data filed at 10/23/16 1700  Gross per 24 hour  Intake              720 ml  Output              300 ml  Net              420 ml   Filed Weights   10/22/16 0519 10/23/16 0500 10/24/16 0650  Weight: 235 lb 14.4 oz (107 kg) 239 lb 9.6 oz (108.7 kg) 239 lb 9.6 oz (108.7 kg)    Physical Exam    GEN: Well nourished, well developed, in no acute distress.  HEENT: Grossly normal.  Neck: Supple Cardiac: Irregular, trace  lower ext edema Respiratory:  CTA GI: Soft, nontender, nondistended MS: no deformity or atrophy. Skin: warm and dry, no rash. Neuro:  Strength and sensation are intact. Psych: AAOx3.  Normal affect.  Labs    CBC  Recent Labs  10/23/16 0656 10/24/16 0224  WBC 6.3 6.4  HGB 14.3 13.6  HCT 44.1 42.5  MCV 91.7 91.8  PLT 174 470   Basic Metabolic Panel  Recent Labs  10/23/16 0656 10/24/16 0224  NA 138 138  K 3.6 3.9  CL 98* 100*  CO2 29 28  GLUCOSE 105* 98  BUN 20 21*  CREATININE 1.04* 1.08*  CALCIUM 9.1 9.1    Telemetry    afib at rate 100- 110s - Personally Reviewed   Patient Profile     88F with arthritis, chronic BLE edema (R>L, following dog attack and ortho injuries), HTN, obesity, 50 year tobacco history and probable mild CKD who presented to [facility] with DOE, found to be in atrial fib RVR and probable acute CHF. Echo showed severe LV dysfunction; cath showed no obstructive CAD.  Assessment & Plan  1. Atrial fib RVR  - Unknown duration. TSH normal. Rate in 100-110s; BP borderline; continue metoprolol xl 50 BID. Add digoxin 0.125 mg daily. Echocardiogram showed severe LV dysfunction of unknown etiology. Cardiac catheterization showed no obstructive coronary disease. Cardiomyopathy may be tachycardia mediated. Continue Apixaban; I recommended one more day on telemetry to make sure that Patient's heart rate controlled but she requested discharge. I will arrange for her to be seen in the office by an extender next week. If her heart rate is controlled and she remains asymptomatic we can arrange cardioversion 4 weeks from now. If her heart rate is not controlled or she is having worsening CHF symptoms would arrange outpatient TEE guided cardioversion. Hopefully reestablishing sinus rhythm will cause her LV function to normalize.  2. HTN  -  blood pressure controlled. Continue present medications.  3. Transaminitis-will need follow-up with primary care.  4.  Tobacco abuse   - Advised cessation.   5.  cardiomyopathy-etiology unknown as described above. Catheterization showed no obstructive coronary disease. May be tachycardia mediated. Continue metoprolol (convert to toprol); add dig. Add ACE inhibitor later as blood pressure allows.   Pt with fU PA OV next week and with me in 6 weeks.  > 30 min PA and physician time D2  Signed, Patricia Ruths, MD  10/24/2016, 9:52 AM

## 2016-10-31 NOTE — Interval H&P Note (Signed)
History and Physical Interval Note:  10/31/2016 10:16 AM  [name]  has presented today for surgery, with the diagnosis of AFIB WITH RVR  The various methods of treatment have been discussed with the patient and family. After consideration of risks, benefits and other options for treatment, the patient has consented to  Procedure(s): CARDIOVERSION (N/A) TRANSESOPHAGEAL ECHOCARDIOGRAM (TEE) (N/A) as a surgical intervention .  The patient's history has been reviewed, patient examined, no change in status, stable for surgery.  I have reviewed the patient's chart and labs.  Questions were answered to the patient's satisfaction.     Patricia Wagner

## 2016-10-31 NOTE — Op Note (Signed)
Cardioversion attempted with pads in apex/base positions  200 J synchronized energy  Transient (few beats) cardioversion  Reverted to afib Pads switched to AP position  Again 200 J synchronized biphasic energy used.  Successful cardioversion to SR.  Initially there were frequent PACs, PAT  Then SR. Procedure was without complication

## 2016-10-31 NOTE — H&P (Signed)
Primary Physician: Primary Cardiologist:  [name]  Pt presents for cardioversion today with increased SOB    HPI: Pt is a 65 yo who was recently discharged from [place]  I saw her in ER last admission  She came in with SOB  Found to be in atrial fib with RVR.  Echo revealed an LVEF of 20 to 25%  Severe LAE  She underwent cath which showed only mild nonobstructive CAD  CHF was felt to be tachy induced.   The pt was placed on Eliquis (CHADS2VASc was 4)  Diruesed  Plan fwas fro cardioversion in 4 wks.    She came in today for TEE cardioversion as HR still not controlled and dyspnea  Daughter says that she did good right at discharge  Breathing was OK  No edema   SInce she has been home this has gotten worse  Ankles up  Doesn't move that much.  SOB now with no activity            Past Medical History:  Diagnosis Date  . Arthritis   . CAD in native artery, s/p cardiac cath with non obstructive CAD 10/24/2016  . DIVERTICULITIS, HX OF 07/25/2007  . DIVERTICULOSIS, COLON 07/22/2007  . Edema, peripheral    a. chronic BLE edema, R>L. Prior trauma from dog attack and accident.  Marland Kitchen HYPERLIPIDEMIA 02/03/2008  . Hypersomnia    declines w/u  . Hypertension   . MENOPAUSAL DISORDER 01/09/2011  . Morbid obesity ([place]) 07/22/2007  . NICM (nonischemic cardiomyopathy) ([place]) 10/24/2016  . Raynaud's syndrome 07/22/2007  . THYROID NODULE, RIGHT 01/04/2010  . VITAMIN D DEFICIENCY 01/09/2011   Qualifier: Diagnosis of  By: [name], [name]     Medications Prior to Admission  Medication Sig Dispense Refill  . acetaminophen (TYLENOL) 325 MG tablet Take 2 tablets (650 mg total) by mouth every 4 (four) hours as needed for headache or mild pain.    Marland Kitchen apixaban (ELIQUIS) 5 MG TABS tablet Take 1 tablet (5 mg total) by mouth 2 (two) times daily. 60 tablet 6  . digoxin (LANOXIN) 0.125 MG tablet Take 1 tablet (0.125 mg total) by mouth daily. 30 tablet 6  . metoprolol (LOPRESSOR) 50 MG tablet Take 1 tablet  (50 mg total) by mouth 2 (two) times daily. 60 tablet 6  . rosuvastatin (CRESTOR) 10 MG tablet Take 1 tablet (10 mg total) by mouth daily. 90 tablet 3     . apixaban  5 mg Oral BID  . digoxin  0.125 mg Oral Daily  . furosemide  60 mg Intravenous Once  . metoprolol  50 mg Oral BID  . rosuvastatin  10 mg Oral Daily  . sodium chloride flush  3 mL Intravenous Q12H    Infusions:   Allergies  Allergen Reactions  . Ace Inhibitors Palpitations    Social History   Social History  . Marital status: Single    Spouse name: N/A  . Number of children: 1  . Years of education: N/A   Occupational History  . Printing     Social History Main Topics  . Smoking status: Current Every Day Smoker    Packs/day: 0.50    Types: Cigarettes  . Smokeless tobacco: Never Used     Comment: Smoked for 50 years  . Alcohol use 2.4 oz/week    4 Cans of beer per week     Comment: Intermittent  . Drug use: No  . Sexual activity: Not on file  Other Topics Concern  . Not on file   Social History Narrative  . No narrative on file    Family History  Problem Relation Age of Onset  . Asthma Mother   . Diabetes Father   . Heart disease Father     Died of presumed heart attack - 68s  . Lung disease Sister   . Heart disease Sister     Twin sister has heart issue, unclear what kind  . Thyroid disease Neg Hx     REVIEW OF SYSTEMS:  All systems reviewed  Negative to the above problem except as noted above.    PHYSICAL EXAM: Vitals:   10/31/16 1220 10/31/16 1230  BP: (!) 152/96 (!) 139/97  Pulse: 74 72  Resp: (!) 29 (!) 33  Temp:      No intake or output data in the 24 hours ending 10/31/16 1418  General:  PT a little SOB on arrival to endoscopy  Sats 87  Improved with O2   HEENT: normal Neck: supple. no JVD. Carotids 2+ bilat; no bruits. No lymphadenopathy or thryomegaly appreciated. Cor: PMI nondisplaced.Irregular rate & rhythm. No rubs, gallops or murmurs. Lungs: Rales at bases   Decreased airflow overall   Abdomen: soft, nontender, nondistended. No hepatosplenomegaly. No bruits or masses. Good bowel sounds. Extremities: no cyanosis, clubbing, rash  1-2+ edema Neuro: alert & oriented x 3, cranial nerves grossly intact. moves all 4 extremities w/o difficulty. Affect pleasant.  ECG:  On 12/5  Atrial fibrillation 105 bpm    Results for orders placed or performed during the hospital encounter of 10/31/16 (from the past 24 hour(s))  Brain natriuretic peptide     Status: Abnormal   Collection Time: 10/31/16 12:46 PM  Result Value Ref Range   B Natriuretic Peptide 1,218.8 (H) 0.0 - 100.0 pg/mL   No results found.   ASSESSMENT: 65 yo with acute on chronic systolic CHF   She has not done well at home  Fluid has reaccumulated  She was mildly hypoxic on arrival to endoscopy today    She underwent TEE cardioversion today  LVEF was approximately 20%  Intial cardioversion was transiently successfult  (couple beats)  Pads moved  REpeat cardioversion was successful.  But frequent PACs  SHort burst PAT This does not bode well for maintaining SR>  Plan to admit pt today  WIll need IV diuresis   FOllow on Telemetry If reverts to atrial fibrillation will review with EP  Re optimal antiarrhythmic     1  Atrial fib  As above  Continue Eliquis  2.  Acute on chronic systoilic CHF  DIuresed  3  HTN  Follow BP .    4  Hx transaminitis  ON last addmit  WIll repeat.

## 2016-11-01 DIAGNOSIS — I481 Persistent atrial fibrillation: Principal | ICD-10-CM

## 2016-11-01 DIAGNOSIS — I5023 Acute on chronic systolic (congestive) heart failure: Secondary | ICD-10-CM

## 2016-11-01 DIAGNOSIS — I480 Paroxysmal atrial fibrillation: Secondary | ICD-10-CM

## 2016-11-01 LAB — BASIC METABOLIC PANEL
ANION GAP: 8 (ref 5–15)
Anion gap: 8 (ref 5–15)
BUN: 18 mg/dL (ref 6–20)
BUN: 16 mg/dL (ref 6–20)
CALCIUM: 8.6 mg/dL — AB (ref 8.9–10.3)
CHLORIDE: 98 mmol/L — AB (ref 101–111)
CO2: 36 mmol/L — AB (ref 22–32)
CO2: 35 mmol/L — ABNORMAL HIGH (ref 22–32)
CREATININE: 1.08 mg/dL — AB (ref 0.44–1.00)
Calcium: 8.6 mg/dL — ABNORMAL LOW (ref 8.9–10.3)
Chloride: 100 mmol/L — ABNORMAL LOW (ref 101–111)
Creatinine, Ser: 1.22 mg/dL — ABNORMAL HIGH (ref 0.44–1.00)
GFR calc Af Amer: >60 mL/min (ref >60)
GFR calc non Af Amer: 46 mL/min — ABNORMAL LOW (ref >60)
GFR, EST AFRICAN AMERICAN: 53 mL/min — AB (ref >60)
GFR, EST NON AFRICAN AMERICAN: 53 mL/min — AB (ref >60)
Glucose, Bld: 89 mg/dL (ref 65–99)
Glucose, Bld: 99 mg/dL (ref 65–99)
POTASSIUM: 3.7 mmol/L (ref 3.5–5.1)
Potassium: 3.5 mmol/L (ref 3.5–5.1)
SODIUM: 144 mmol/L (ref 135–145)
SODIUM: 141 mmol/L (ref 135–145)

## 2016-11-01 LAB — MAGNESIUM
MAGNESIUM: 1.8 mg/dL (ref 1.7–2.4)
MAGNESIUM: 2.4 mg/dL (ref 1.7–2.4)

## 2016-11-01 MED ORDER — POTASSIUM CHLORIDE CRYS ER 20 MEQ PO TBCR
40.0000 meq | EXTENDED_RELEASE_TABLET | Freq: Once | ORAL | Status: AC
  Administered 2016-11-01: 40 meq via ORAL
  Filled 2016-11-01: qty 2

## 2016-11-01 MED ORDER — MAGNESIUM SULFATE 2 GM/50ML IV SOLN
2.0000 g | Freq: Once | INTRAVENOUS | Status: AC
  Administered 2016-11-01: 2 g via INTRAVENOUS
  Filled 2016-11-01: qty 50

## 2016-11-01 MED ORDER — POTASSIUM CHLORIDE CRYS ER 20 MEQ PO TBCR
40.0000 meq | EXTENDED_RELEASE_TABLET | Freq: Two times a day (BID) | ORAL | Status: DC
  Administered 2016-11-01: 40 meq via ORAL
  Filled 2016-11-01: qty 2

## 2016-11-01 MED ORDER — SOTALOL HCL 120 MG PO TABS
120.0000 mg | ORAL_TABLET | Freq: Two times a day (BID) | ORAL | Status: DC
  Administered 2016-11-01 – 2016-11-03 (×4): 120 mg via ORAL
  Filled 2016-11-01 (×4): qty 1

## 2016-11-01 MED ORDER — LOSARTAN POTASSIUM 25 MG PO TABS
12.5000 mg | ORAL_TABLET | Freq: Every day | ORAL | Status: DC
  Administered 2016-11-01 – 2016-11-02 (×2): 12.5 mg via ORAL
  Filled 2016-11-01 (×2): qty 1

## 2016-11-01 NOTE — Consult Note (Signed)
ELECTROPHYSIOLOGY CONSULT NOTE    Primary Care Physician: Cathlean Cower, MD Referring Physician:  Dr Garner Gavel Date: 10/31/2016  Reason for consultation:  afib  Patricia Wagner is a 65 y.o. female with a h/o recently diagnosed persistent atrial fibrillation.  She has developed symptoms of CHF and is found to have EF 20%.  She has been managed initially with rate control.  Cath is reviewed.  TEE reveals no LAA thrombus or significant valvular disease.  She is appropriately anticoagulated with eliquis but is concerned about costs.  Today, she denies symptoms of palpitations, chest pain, dizziness, presyncope, syncope, or neurologic sequela. The patient is tolerating medications without difficulties and is otherwise without complaint today.   Past Medical History:  Diagnosis Date  . Arthritis   . CAD in native artery, s/p cardiac cath with non obstructive CAD 10/24/2016  . CHF (congestive heart failure) ([place])   . DIVERTICULITIS, HX OF 07/25/2007  . DIVERTICULOSIS, COLON 07/22/2007  . Dyspnea   . Edema, peripheral    a. chronic BLE edema, R>L. Prior trauma from dog attack and accident.  Marland Kitchen HYPERLIPIDEMIA 02/03/2008  . Hypersomnia    declines w/u  . Hypertension   . MENOPAUSAL DISORDER 01/09/2011  . Morbid obesity ([place]) 07/22/2007  . NICM (nonischemic cardiomyopathy) ([place]) 10/24/2016  . Raynaud's syndrome 07/22/2007  . THYROID NODULE, RIGHT 01/04/2010  . VITAMIN D DEFICIENCY 01/09/2011   Qualifier: Diagnosis of  By: [name], [name]    Past Surgical History:  Procedure Laterality Date  . CARDIAC CATHETERIZATION N/A 10/23/2016   Procedure: Left Heart Cath and Coronary Angiography;  Surgeon: [name], MD;  Location: [facility];  Service: Cardiovascular;  Laterality: N/A;  . CHOLECYSTECTOMY    . COLONOSCOPY    . PARTIAL HYSTERECTOMY     1 OVARY LEFT  . POLYPECTOMY      . apixaban  5 mg Oral BID  . digoxin  0.125 mg Oral Daily  . furosemide  40 mg Intravenous BID  . losartan   12.5 mg Oral Daily  . magnesium sulfate 1 - 4 g bolus IVPB  2 g Intravenous Once  . metoprolol  75 mg Oral BID  . rosuvastatin  10 mg Oral Daily  . sodium chloride flush  3 mL Intravenous Q12H  . sotalol  120 mg Oral Q12H     Allergies  Allergen Reactions  . Ace Inhibitors Palpitations    Social History   Social History  . Marital status: Single    Spouse name: N/A  . Number of children: 1  . Years of education: N/A   Occupational History  . Printing     Social History Main Topics  . Smoking status: Former Smoker    Packs/day: 0.50    Types: Cigarettes    Quit date: 10/23/2016  . Smokeless tobacco: Never Used     Comment: Smoked for 50 years  . Alcohol use 2.4 oz/week    4 Cans of beer per week     Comment: Intermittent  . Drug use: No  . Sexual activity: Not on file   Other Topics Concern  . Not on file   Social History Narrative  . No narrative on file    Family History  Problem Relation Age of Onset  . Asthma Mother   . Diabetes Father   . Heart disease Father     Died of presumed heart attack - 59s  . Lung disease Sister   . Heart  disease Sister     Twin sister has heart issue, unclear what kind  . Thyroid disease Neg Hx     ROS- All systems are reviewed and negative except as per the HPI above  Physical Exam: Telemetry: Vitals:   10/31/16 1445 10/31/16 2027 10/31/16 2153 11/01/16 0500  BP:  119/74  134/75  Pulse: (!) 115 72 88 69  Resp:  18  18  Temp:  (!) 100.4 F (38 C)  98.9 F (37.2 C)  TempSrc:  Oral  Oral  SpO2:  99%  100%  Weight:    243 lb 12.8 oz (110.6 kg)  Height:        GEN- The patient is well appearing, alert and oriented x 3 today.   Head- normocephalic, atraumatic Eyes-  Sclera clear, conjunctiva pink Ears- hearing intact Oropharynx- clear Neck- supple,   Lungs- Clear to ausculation bilaterally, normal work of breathing Heart- irregular rate and rhythm  GI- soft, NT, ND, + BS Extremities- no clubbing, cyanosis,  or edema MS- no significant deformity or atrophy Skin- no rash or lesion Psych- euthymic mood, full affect Neuro- strength and sensation are intact  EKG- afib with LVH  Labs:   Lab Results  Component Value Date   WBC 11.6 (H) 10/31/2016   HGB 12.6 10/31/2016   HCT 39.5 10/31/2016   MCV 93.2 10/31/2016   PLT 178 10/31/2016    Recent Labs Lab 10/31/16 1556  11/01/16 0421  NA 140  < > 144  K 4.1  < > 3.5  CL 106  < > 100*  CO2 28  < > 36*  BUN 18  < > 18  CREATININE 1.14*  < > 1.22*  CALCIUM 8.5*  < > 8.6*  PROT 6.4*  --   --   BILITOT 1.1  --   --   ALKPHOS 51  --   --   ALT 39  --   --   AST 21  --   --   GLUCOSE 118*  < > 89  < > = values in this interval not displayed. Lab Results  Component Value Date   TROPONINI 0.04 ([place]) 10/21/2016    Lab Results  Component Value Date   CHOL 153 10/21/2016   CHOL 217 (H) 10/25/2015   CHOL 200 05/03/2013   Lab Results  Component Value Date   HDL 46 10/21/2016   HDL 63.40 10/25/2015   HDL 63.10 05/03/2013   Lab Results  Component Value Date   LDLCALC 95 10/21/2016   LDLCALC 137 (H) 10/25/2015   LDLCALC 120 (H) 05/03/2013   Lab Results  Component Value Date   TRIG 58 10/21/2016   TRIG 80.0 10/25/2015   TRIG 84.0 05/03/2013   Lab Results  Component Value Date   CHOLHDL 3.3 10/21/2016   CHOLHDL 3 10/25/2015   CHOLHDL 3 05/03/2013   Lab Results  Component Value Date   LDLDIRECT 121.6 03/15/2012   LDLDIRECT 133.4 01/09/2011   LDLDIRECT 124.5 01/04/2010    Echo:  reviewed  ASSESSMENT AND PLAN:   1. Persistent atrial fibrillation The patient has symptomatic persistent atrial fibrillation.  Her symptoms are primarily CHF and reduced EF.  She is appropriately anticoagulated with eliquis.  TEE is reviewed. Therapeutic strategies for afib including rate and rhythm control were discussed in detail with the patient today. I think that at this time, she would consider rhythm control.  Though there were initial  discussions about tikosyn, she is concerned about costs  of the medicine.  I agree that this could be a challenge for her.  After long discussion, we decided together to pursue sotalol as an alternative.  Risks of sotalol were discussed with the patient who wishes to proceed. I will therefore start sotalol 120mg  BID.  We will follow QT closely and adjust dose as needed. Will also follow on current beta blocker.  We may need to reduce the dose of her beta blocker if she develops bradycardia with sinus rhythm.  2. Hypokalemia Keep K >3.9 and Mg > 1.9  3. Acute systolic dysfunction Hopefully will improve with sinus rhythm  4. Morbid obesity Body mass index is 40.57 kg/m.   Thompson Grayer, MD 11/01/2016  2:04 PM

## 2016-11-01 NOTE — Progress Notes (Addendum)
Primary cardiologist:  Subjective:    No complaints this AM  Objective:   Temp:  [98.9 F (37.2 C)-100.4 F (38 C)] 98.9 F (37.2 C) (12/09 0500) Pulse Rate:  [69-115] 69 (12/09 0500) Resp:  [11-33] 18 (12/09 0500) BP: (119-152)/(74-102) 134/75 (12/09 0500) SpO2:  [96 %-100 %] 100 % (12/09 0500) Weight:  [243 lb 12.8 oz (110.6 kg)-249 lb (112.9 kg)] 243 lb 12.8 oz (110.6 kg) (12/09 0500)    Filed Weights   10/31/16 0936 11/01/16 0500  Weight: 249 lb (112.9 kg) 243 lb 12.8 oz (110.6 kg)    Intake/Output Summary (Last 24 hours) at 11/01/16 0856 Last data filed at 11/01/16 0600  Gross per 24 hour  Intake           533.33 ml  Output                0 ml  Net           533.33 ml    Telemetry:afib  Exam:  General: NAD  HEENT:sclera clear, throat clear  Resp: CTAB  Cardiac:irreg, no m/r/g, no jv  GI: abdomen soft, NT, ND  MSK: 2+ bilateral LE edema  Neuro: no focal deficits  Psych: appropriate affect  Lab Results:  Basic Metabolic Panel:  Recent Labs Lab 10/31/16 1556 10/31/16 1832 11/01/16 0421  NA 140 142 144  K 4.1 3.8 3.5  CL 106 102 100*  CO2 28 31 36*  GLUCOSE 118* 112* 89  BUN 18 18 18   CREATININE 1.14* 1.26* 1.22*  CALCIUM 8.5* 8.9 8.6*  MG  --  1.9 1.8    Liver Function Tests:  Recent Labs Lab 10/28/16 0911 10/31/16 1556  AST 36* 21  ALT 58* 39  ALKPHOS 64 51  BILITOT 0.7 1.1  PROT 6.6 6.4*  ALBUMIN 3.5* 2.9*    CBC:  Recent Labs Lab 10/28/16 0911 10/31/16 1556  WBC 6.2 11.6*  HGB 14.0 12.6  HCT 44.1 39.5  MCV 92.3 93.2  PLT 192 178    Cardiac Enzymes: No results for input(s): CKTOTAL, CKMB, CKMBINDEX, TROPONINI in the last 168 hours.  BNP: No results for input(s): PROBNP in the last 8760 hours.  Coagulation:  Recent Labs Lab 10/28/16 0911  INR 1.1    ECG:   Medications:   Scheduled Medications: . apixaban  5 mg Oral BID  . digoxin  0.125 mg Oral Daily  . furosemide  40 mg Intravenous BID  .  metoprolol  75 mg Oral BID  . potassium chloride  40 mEq Oral BID  . rosuvastatin  10 mg Oral Daily  . sodium chloride flush  3 mL Intravenous Q12H     Infusions: . 0.9 % NaCl with KCl 20 mEq / L 50 mL/hr at 10/31/16 2150     PRN Medications:  sodium chloride, acetaminophen, ondansetron (ZOFRAN) IV, sodium chloride flush     Assessment/Plan   1. PAF - s/p TEE/DCCV yesterday, back in afib this AM - from Dr Harrington Challenger notes discussed with EP, recs were dofetilide. Baseline QTc 460, GFR46. K is 3.6, Mg 1.8.  - discussed with Dr Rayann Heman, we will get K to 4 and Mg to 2, plan to start tikasyn later today. I have written for additional 40 mEq KCl for 1pm and 2 g of IV magnesium, repeat BMET and Mg in 4pm today, if at goal start tikasyn later this afternoon.  - on eliquis  2. Chronic systolic HF - LVEF 86-75%. Cath with  nonobstructive CAD - medical therapy with lopressor 75mg  bid (will need to change to long acting Toprol once rate controlled). Has not been on ACE/ARB. ACE-Is are listed as causing palpitations, we will start losartan 12.5mg  daily.  - evidence of voluem overload, continue IV lasix.   3. Hypokalemia - on IV replacement. Goal would be K of 4 prior to starting tikasyn.    Carlyle Dolly, M.D., F.A.C.C.Patient ID: Patricia Wagner, female   DOB: 1951-10-20, 65 y.o.   MRN: [id number]

## 2016-11-01 NOTE — Progress Notes (Signed)
CCMD notified RN at [place] about 5-5 beats of Vtach. Went to check on pt and was stable and asleep. RN was notified again at 2051 about 13 beats of Vtach. Went to check on pt again and was using the bathroom and no complaints. RN notified cardio fellow on call. New orders to start fluids NS with 66meq KCL at 81ml and check magnesium. New result was 1.9 Was notified by CCMD at 0033 that pt had a 2.23 pause.  Went to check on pt and was stable. Will continue to monitor.

## 2016-11-02 LAB — BASIC METABOLIC PANEL
ANION GAP: 2 — AB (ref 5–15)
BUN: 18 mg/dL (ref 6–20)
CO2: 42 mmol/L — AB (ref 22–32)
CREATININE: 1.18 mg/dL — AB (ref 0.44–1.00)
Calcium: 8.7 mg/dL — ABNORMAL LOW (ref 8.9–10.3)
Chloride: 97 mmol/L — ABNORMAL LOW (ref 101–111)
GFR calc non Af Amer: 48 mL/min — ABNORMAL LOW (ref >60)
GFR, EST AFRICAN AMERICAN: 56 mL/min — AB (ref >60)
GLUCOSE: 99 mg/dL (ref 65–99)
Potassium: 4.1 mmol/L (ref 3.5–5.1)
Sodium: 141 mmol/L (ref 135–145)

## 2016-11-02 MED ORDER — OFF THE BEAT BOOK
Freq: Once | Status: DC
  Filled 2016-11-02: qty 1

## 2016-11-02 NOTE — Plan of Care (Signed)
Problem: Cardiac: Goal: Ability to achieve and maintain adequate cardiopulmonary perfusion will improve Outcome: Progressing Sotalol therapy initiated. Daily EKG. Atrial Fibrillation with controlled Rate.

## 2016-11-02 NOTE — Plan of Care (Signed)
Problem: Education: Goal: Knowledge of disease or condition will improve Outcome: Progressing Educational Handouts provided for Atrial Fibrillation, CHF, CAD and new medication Sotalol

## 2016-11-02 NOTE — Progress Notes (Signed)
   SUBJECTIVE: The patient is doing well today.  She has dry mouth which she attributes to sotalol.  Remains in afib.  At this time, she denies chest pain, shortness of breath, or any new concerns.  Marland Kitchen apixaban  5 mg Oral BID  . digoxin  0.125 mg Oral Daily  . furosemide  40 mg Intravenous BID  . losartan  12.5 mg Oral Daily  . metoprolol  75 mg Oral BID  . rosuvastatin  10 mg Oral Daily  . sodium chloride flush  3 mL Intravenous Q12H  . sotalol  120 mg Oral Q12H     OBJECTIVE: Physical Exam: Vitals:   11/01/16 1500 11/01/16 2028 11/01/16 2308 11/02/16 0500  BP: 130/70 114/69 127/86 121/68  Pulse: 69 95 (!) 103 69  Resp: 15 16  16   Temp: 98.1 F (36.7 C) 99.5 F (37.5 C)  98.6 F (37 C)  TempSrc: Oral Oral  Oral  SpO2: 100% 100%  99%  Weight:    243 lb 3.2 oz (110.3 kg)  Height:        Intake/Output Summary (Last 24 hours) at 11/02/16 0912 Last data filed at 11/02/16 0600  Gross per 24 hour  Intake              365 ml  Output                0 ml  Net              365 ml    Tele reveals rate controlled afib  GEN- The patient is well appearing, alert and oriented x 3 today.   Head- normocephalic, atraumatic Eyes-  Sclera clear, conjunctiva pink Ears- hearing intact Oropharynx- clear Neck- supple,   Lungs- Clear to ausculation bilaterally, normal work of breathing Heart- irregular rate and rhythm, no murmurs, rubs or gallops, PMI not laterally displaced GI- soft, NT, ND, + BS Extremities- no clubbing, cyanosis, or edema Skin- no rash or lesion Psych- euthymic mood, full affect Neuro- strength and sensation are intact  LABS: Basic Metabolic Panel:  Recent Labs  11/01/16 0421 11/01/16 1533 11/02/16 0252  NA 144 141 141  K 3.5 3.7 4.1  CL 100* 98* 97*  CO2 36* 35* 42*  GLUCOSE 89 99 99  BUN 18 16 18   CREATININE 1.22* 1.08* 1.18*  CALCIUM 8.6* 8.6* 8.7*  MG 1.8 2.4  --    Liver Function Tests:  Recent Labs  10/31/16 1556  AST 21  ALT 39    ALKPHOS 51  BILITOT 1.1  PROT 6.4*  ALBUMIN 2.9*   No results for input(s): LIPASE, AMYLASE in the last 72 hours. CBC:  Recent Labs  10/31/16 1556  WBC 11.6*  HGB 12.6  HCT 39.5  MCV 93.2  PLT 178    ASSESSMENT AND PLAN:  1. Persistent atrial fibrillation The patient has symptomatic persistent atrial fibrillation.  Her symptoms are primarily CHF and reduced EF.  She is appropriately anticoagulated with eliquis.  TEE is reviewed. Remains in afib with sotalol. Anticipate cardioversion tomorrow am if schedule allows NPO after midnight  2. Hypokalemia Keep K >3.9 and Mg > 1.9  3. Acute systolic dysfunction Hopefully will improve with sinus rhythm  4. Morbid obesity Body mass index is 40.47 kg/m.  Thompson Grayer, MD 11/02/2016 9:12 AM

## 2016-11-03 ENCOUNTER — Inpatient Hospital Stay (HOSPITAL_COMMUNITY): Payer: Managed Care, Other (non HMO) | Admitting: Anesthesiology

## 2016-11-03 ENCOUNTER — Encounter (HOSPITAL_COMMUNITY)
Admission: AD | Disposition: A | Discharge status: Home / Self Care | Payer: Self-pay | Source: Ambulatory Visit | Attending: Internal Medicine

## 2016-11-03 ENCOUNTER — Encounter (HOSPITAL_COMMUNITY): Payer: Self-pay | Admitting: Internal Medicine

## 2016-11-03 DIAGNOSIS — I480 Paroxysmal atrial fibrillation: Secondary | ICD-10-CM

## 2016-11-03 DIAGNOSIS — I10 Essential (primary) hypertension: Secondary | ICD-10-CM

## 2016-11-03 DIAGNOSIS — I4819 Other persistent atrial fibrillation: Secondary | ICD-10-CM | POA: Diagnosis present

## 2016-11-03 HISTORY — PX: CARDIOVERSION: SHX1299

## 2016-11-03 LAB — BASIC METABOLIC PANEL
ANION GAP: 10 (ref 5–15)
BUN: 18 mg/dL (ref 6–20)
CO2: 34 mmol/L — ABNORMAL HIGH (ref 22–32)
Calcium: 8.8 mg/dL — ABNORMAL LOW (ref 8.9–10.3)
Chloride: 96 mmol/L — ABNORMAL LOW (ref 101–111)
Creatinine, Ser: 1.05 mg/dL — ABNORMAL HIGH (ref 0.44–1.00)
GFR calc Af Amer: >60 mL/min (ref >60)
GFR, EST NON AFRICAN AMERICAN: 55 mL/min — AB (ref >60)
GLUCOSE: 94 mg/dL (ref 65–99)
POTASSIUM: 3.7 mmol/L (ref 3.5–5.1)
Sodium: 140 mmol/L (ref 135–145)

## 2016-11-03 LAB — DIGOXIN LEVEL: DIGOXIN LVL: 0.4 ng/mL — AB (ref 0.8–2.0)

## 2016-11-03 LAB — MAGNESIUM: Magnesium: 1.7 mg/dL (ref 1.7–2.4)

## 2016-11-03 SURGERY — CARDIOVERSION
Anesthesia: General

## 2016-11-03 MED ORDER — MAGNESIUM OXIDE 400 (241.3 MG) MG PO TABS
400.0000 mg | ORAL_TABLET | Freq: Two times a day (BID) | ORAL | Status: DC
  Administered 2016-11-03 – 2016-11-04 (×3): 400 mg via ORAL
  Filled 2016-11-03 (×3): qty 1

## 2016-11-03 MED ORDER — ACETAMINOPHEN 325 MG PO TABS
650.0000 mg | ORAL_TABLET | ORAL | Status: DC | PRN
Start: 2016-11-03 — End: 2016-11-04
  Administered 2016-11-03: 650 mg via ORAL
  Filled 2016-11-03: qty 2

## 2016-11-03 MED ORDER — MAGNESIUM OXIDE 400 (241.3 MG) MG PO TABS: 400.0000 mg | ORAL_TABLET | Freq: Two times a day (BID) | ORAL | Status: DC

## 2016-11-03 MED ORDER — SODIUM CHLORIDE 0.9% FLUSH: 3.0000 mL | INTRAVENOUS | Status: DC | PRN

## 2016-11-03 MED ORDER — ROSUVASTATIN CALCIUM 10 MG PO TABS
10.0000 mg | ORAL_TABLET | Freq: Every day | ORAL | Status: DC
  Administered 2016-11-03 – 2016-11-04 (×2): 10 mg via ORAL
  Filled 2016-11-03 (×2): qty 1

## 2016-11-03 MED ORDER — FUROSEMIDE 40 MG PO TABS: 40.0000 mg | ORAL_TABLET | Freq: Every day | ORAL | Status: DC

## 2016-11-03 MED ORDER — DIGOXIN 125 MCG PO TABS
0.1250 mg | ORAL_TABLET | Freq: Every day | ORAL | Status: DC
  Administered 2016-11-04: 0.125 mg via ORAL
  Filled 2016-11-03 (×2): qty 1

## 2016-11-03 MED ORDER — METOPROLOL TARTRATE 50 MG PO TABS
50.0000 mg | ORAL_TABLET | Freq: Two times a day (BID) | ORAL | Status: DC
  Administered 2016-11-03 – 2016-11-04 (×3): 50 mg via ORAL
  Filled 2016-11-03 (×2): qty 1

## 2016-11-03 MED ORDER — SODIUM CHLORIDE 0.9 % IV SOLN: 250.0000 mL | INTRAVENOUS | Status: DC | PRN

## 2016-11-03 MED ORDER — POTASSIUM CHLORIDE CRYS ER 20 MEQ PO TBCR
20.0000 meq | EXTENDED_RELEASE_TABLET | Freq: Every day | ORAL | Status: DC
  Administered 2016-11-03 – 2016-11-04 (×2): 20 meq via ORAL
  Filled 2016-11-03 (×2): qty 1

## 2016-11-03 MED ORDER — SODIUM CHLORIDE 0.9% FLUSH: 3.0000 mL | Freq: Two times a day (BID) | INTRAVENOUS | Status: DC

## 2016-11-03 MED ORDER — ONDANSETRON HCL 4 MG/2ML IJ SOLN: 4.0000 mg | Freq: Four times a day (QID) | INTRAMUSCULAR | Status: DC | PRN

## 2016-11-03 MED ORDER — LOSARTAN POTASSIUM 25 MG PO TABS
25.0000 mg | ORAL_TABLET | Freq: Every day | ORAL | Status: DC
  Administered 2016-11-03 – 2016-11-04 (×2): 25 mg via ORAL
  Filled 2016-11-03 (×2): qty 1

## 2016-11-03 MED ORDER — SOTALOL HCL 120 MG PO TABS
120.0000 mg | ORAL_TABLET | Freq: Two times a day (BID) | ORAL | Status: DC
  Administered 2016-11-03 – 2016-11-04 (×2): 120 mg via ORAL
  Filled 2016-11-03 (×3): qty 1

## 2016-11-03 MED ORDER — APIXABAN 5 MG PO TABS
5.0000 mg | ORAL_TABLET | Freq: Two times a day (BID) | ORAL | Status: DC
  Administered 2016-11-03 – 2016-11-04 (×2): 5 mg via ORAL
  Filled 2016-11-03 (×2): qty 1

## 2016-11-03 MED ORDER — PROPOFOL 10 MG/ML IV BOLUS
INTRAVENOUS | Status: DC | PRN
  Administered 2016-11-03: 50 mg via INTRAVENOUS

## 2016-11-03 MED ORDER — SODIUM CHLORIDE 0.9 % IV SOLN: 250.0000 mL | INTRAVENOUS | Status: DC

## 2016-11-03 MED ORDER — ACETAMINOPHEN 325 MG PO TABS: 650.0000 mg | ORAL_TABLET | ORAL | Status: DC | PRN

## 2016-11-03 MED ORDER — LOSARTAN POTASSIUM 25 MG PO TABS: 25.0000 mg | ORAL_TABLET | Freq: Every day | ORAL | Status: DC

## 2016-11-03 MED ORDER — PROPOFOL 10 MG/ML IV BOLUS
INTRAVENOUS | Status: AC
  Filled 2016-11-03: qty 20

## 2016-11-03 MED ORDER — ROSUVASTATIN CALCIUM 10 MG PO TABS: 10.0000 mg | ORAL_TABLET | Freq: Every day | ORAL | Status: DC

## 2016-11-03 MED ORDER — SODIUM CHLORIDE 0.9 % IV SOLN
INTRAVENOUS | Status: DC
  Administered 2016-11-03: 500 mL via INTRAVENOUS

## 2016-11-03 MED ORDER — POTASSIUM CHLORIDE CRYS ER 20 MEQ PO TBCR: 20.0000 meq | EXTENDED_RELEASE_TABLET | Freq: Every day | ORAL | Status: DC

## 2016-11-03 MED ORDER — LIDOCAINE 2% (20 MG/ML) 5 ML SYRINGE
INTRAMUSCULAR | Status: DC | PRN
  Administered 2016-11-03: 100 mg via INTRAVENOUS

## 2016-11-03 MED ORDER — OFF THE BEAT BOOK
Freq: Once | Status: AC
  Filled 2016-11-03: qty 1

## 2016-11-03 MED ORDER — FUROSEMIDE 40 MG PO TABS
40.0000 mg | ORAL_TABLET | Freq: Every day | ORAL | Status: DC
  Administered 2016-11-03 – 2016-11-04 (×2): 40 mg via ORAL
  Filled 2016-11-03 (×2): qty 1

## 2016-11-03 MED ORDER — LIDOCAINE 2% (20 MG/ML) 5 ML SYRINGE
INTRAMUSCULAR | Status: AC
  Filled 2016-11-03: qty 5

## 2016-11-03 NOTE — Progress Notes (Signed)
SUBJECTIVE: The patient is doing stable today.  At this time, she denies chest pain, shortness of breath. She has persistent dry mouth that she thinks is related to Sotalol and is nauseated this morning  CURRENT MEDICATIONS: . apixaban  5 mg Oral BID  . digoxin  0.125 mg Oral Daily  . furosemide  40 mg Intravenous BID  . losartan  12.5 mg Oral Daily  . metoprolol  75 mg Oral BID  . off the beat book   Does not apply Once  . rosuvastatin  10 mg Oral Daily  . sodium chloride flush  3 mL Intravenous Q12H  . sodium chloride flush  3 mL Intravenous Q12H  . sotalol  120 mg Oral Q12H   . sodium chloride    . sodium chloride      OBJECTIVE: Physical Exam: Vitals:   11/02/16 1428 11/02/16 2140 11/02/16 2251 11/03/16 0341  BP: 111/81 (!) 143/78  (!) 140/99  Pulse: 64 (!) 54 80 75  Resp:      Temp: 98.5 F (36.9 C) 98.3 F (36.8 C)  97.9 F (36.6 C)  TempSrc: Oral Oral  Oral  SpO2: 91% 94%  100%  Weight:    235 lb 1.6 oz (106.6 kg)  Height:       No intake or output data in the 24 hours ending 11/03/16 0805  Telemetry reveals rate controlled atrial fibrillation with PVC's   GEN- The patient is well appearing, alert and oriented x 3 today.   Head- normocephalic, atraumatic Eyes-  Sclera clear, conjunctiva pink Ears- hearing intact Oropharynx- clear Neck- supple  Lungs- normal work of breathing, scattered wheezing Heart- Irregular rate and rhythm  GI- soft, NT, ND, + BS Extremities- no clubbing, cyanosis, or edema Skin- no rash or lesion Psych- euthymic mood, full affect Neuro- strength and sensation are intact  LABS: Basic Metabolic Panel:  Recent Labs  11/01/16 1533 11/02/16 0252 11/03/16 0501  NA 141 141 140  K 3.7 4.1 3.7  CL 98* 97* 96*  CO2 35* 42* 34*  GLUCOSE 99 99 94  BUN 16 18 18   CREATININE 1.08* 1.18* 1.05*  CALCIUM 8.6* 8.7* 8.8*  MG 2.4  --  1.7   Liver Function Tests:  Recent Labs  10/31/16 1556  AST 21  ALT 39  ALKPHOS 51    BILITOT 1.1  PROT 6.4*  ALBUMIN 2.9*    CBC:  Recent Labs  10/31/16 1556  WBC 11.6*  HGB 12.6  HCT 39.5  MCV 93.2  PLT 178    RADIOLOGY: Dg Chest Port 1 View Result Date: 10/20/2016 CLINICAL DATA:  Atrial fibrillation and shortness of breath. EXAM: PORTABLE CHEST 1 VIEW COMPARISON:  05/04/2008 FINDINGS: The heart is enlarged. There is aortic atherosclerosis. There is pulmonary venous hypertension with early interstitial edema. No focal pulmonary lesion. No acute bone finding. IMPRESSION: Cardiomegaly and interstitial edema. Electronically Signed   By: Nelson Chimes M.D.   On: 10/20/2016 19:27    ASSESSMENT AND PLAN:  Active Problems:   CHF (congestive heart failure) ([place])  1.  Persistent atrial fibrillation Plan DCCV today on Sotalol Will supplement K and Mg  2. Acute systolic heart failure Weight down 8 pounds? Since admission Will change IV Lasix to po today  3.  Obesity Body mass index is 39.12 kg/m. Lifestyle modification will be required to maintain SR long term  4.  Hypertension BP elevated Will increase Losartan   Chanetta Marshall, NP 11/03/2016 8:12 AM  I have seen, examined the patient, and reviewed the above assessment and plan.  On exam, iRRR. Changes to above are made where necessary.  QT is stable.  Anticipate cardioversion today.  Reduce diuresis and replete electrolytes Hopefully home tomorrow.  Co Sign: Thompson Grayer, MD 11/03/2016 8:17 AM

## 2016-11-03 NOTE — Anesthesia Postprocedure Evaluation (Signed)
Anesthesia Post Note  Patient: Patricia Wagner  Procedure(s) Performed: Procedure(s) (LRB): CARDIOVERSION (N/A)  Patient location during evaluation: PACU Anesthesia Type: General Level of consciousness: sedated Pain management: satisfactory to patient Vital Signs Assessment: post-procedure vital signs reviewed and stable Respiratory status: spontaneous breathing Cardiovascular status: stable Anesthetic complications: no    Last Vitals:  Vitals:   11/03/16 1200 11/03/16 1210  BP: (!) 142/85 137/77  Pulse: (!) 48 (!) 48  Resp: (!) 27 20  Temp:      Last Pain:  Vitals:   11/03/16 1148  TempSrc: Oral  PainSc:                  Riccardo Dubin

## 2016-11-03 NOTE — H&P (View-Only) (Signed)
   SUBJECTIVE: The patient is doing well today.  She has dry mouth which she attributes to sotalol.  Remains in afib.  At this time, she denies chest pain, shortness of breath, or any new concerns.  Marland Kitchen apixaban  5 mg Oral BID  . digoxin  0.125 mg Oral Daily  . furosemide  40 mg Intravenous BID  . losartan  12.5 mg Oral Daily  . metoprolol  75 mg Oral BID  . rosuvastatin  10 mg Oral Daily  . sodium chloride flush  3 mL Intravenous Q12H  . sotalol  120 mg Oral Q12H     OBJECTIVE: Physical Exam: Vitals:   11/01/16 1500 11/01/16 2028 11/01/16 2308 11/02/16 0500  BP: 130/70 114/69 127/86 121/68  Pulse: 69 95 (!) 103 69  Resp: 15 16  16   Temp: 98.1 F (36.7 C) 99.5 F (37.5 C)  98.6 F (37 C)  TempSrc: Oral Oral  Oral  SpO2: 100% 100%  99%  Weight:    243 lb 3.2 oz (110.3 kg)  Height:        Intake/Output Summary (Last 24 hours) at 11/02/16 0912 Last data filed at 11/02/16 0600  Gross per 24 hour  Intake              365 ml  Output                0 ml  Net              365 ml    Tele reveals rate controlled afib  GEN- The patient is well appearing, alert and oriented x 3 today.   Head- normocephalic, atraumatic Eyes-  Sclera clear, conjunctiva pink Ears- hearing intact Oropharynx- clear Neck- supple,   Lungs- Clear to ausculation bilaterally, normal work of breathing Heart- irregular rate and rhythm, no murmurs, rubs or gallops, PMI not laterally displaced GI- soft, NT, ND, + BS Extremities- no clubbing, cyanosis, or edema Skin- no rash or lesion Psych- euthymic mood, full affect Neuro- strength and sensation are intact  LABS: Basic Metabolic Panel:  Recent Labs  11/01/16 0421 11/01/16 1533 11/02/16 0252  NA 144 141 141  K 3.5 3.7 4.1  CL 100* 98* 97*  CO2 36* 35* 42*  GLUCOSE 89 99 99  BUN 18 16 18   CREATININE 1.22* 1.08* 1.18*  CALCIUM 8.6* 8.6* 8.7*  MG 1.8 2.4  --    Liver Function Tests:  Recent Labs  10/31/16 1556  AST 21  ALT 39    ALKPHOS 51  BILITOT 1.1  PROT 6.4*  ALBUMIN 2.9*   No results for input(s): LIPASE, AMYLASE in the last 72 hours. CBC:  Recent Labs  10/31/16 1556  WBC 11.6*  HGB 12.6  HCT 39.5  MCV 93.2  PLT 178    ASSESSMENT AND PLAN:  1. Persistent atrial fibrillation The patient has symptomatic persistent atrial fibrillation.  Her symptoms are primarily CHF and reduced EF.  She is appropriately anticoagulated with eliquis.  TEE is reviewed. Remains in afib with sotalol. Anticipate cardioversion tomorrow am if schedule allows NPO after midnight  2. Hypokalemia Keep K >3.9 and Mg > 1.9  3. Acute systolic dysfunction Hopefully will improve with sinus rhythm  4. Morbid obesity Body mass index is 40.47 kg/m.  Thompson Grayer, MD 11/02/2016 9:12 AM

## 2016-11-03 NOTE — Anesthesia Preprocedure Evaluation (Addendum)
Anesthesia Evaluation  Patient identified by MRN, date of birth, ID band Patient awake    Reviewed: Allergy & Precautions, H&P , Patient's Chart, lab work & pertinent test results, reviewed documented beta blocker date and time   Airway Mallampati: II  TM Distance: >3 FB Neck ROM: full    Dental no notable dental hx.    Pulmonary Current Smoker, former smoker,    Pulmonary exam normal breath sounds clear to auscultation       Cardiovascular hypertension, +CHF   Rhythm:regular Rate:Normal     Neuro/Psych    GI/Hepatic   Endo/Other    Renal/GU      Musculoskeletal   Abdominal   Peds  Hematology   Anesthesia Other Findings  Echo..... Systolic function was severely reduced. The estimated   ejection fraction was in the range of 20% to 25%.   Reproductive/Obstetrics                             Anesthesia Physical  Anesthesia Plan  ASA: III  Anesthesia Plan: General   Post-op Pain Management:    Induction: Intravenous  Airway Management Planned: Mask  Additional Equipment:   Intra-op Plan:   Post-operative Plan:   Informed Consent: I have reviewed the patients History and Physical, chart, labs and discussed the procedure including the risks, benefits and alternatives for the proposed anesthesia with the patient or authorized representative who has indicated his/her understanding and acceptance.   Dental Advisory Given and Dental advisory given  Plan Discussed with: CRNA and Surgeon  Anesthesia Plan Comments: (Has low grade fever. Some SOB. 99 % RA sat; no wheezes or rales. No productive cough Discussed GA with LMA possible ; included sore throat, potential need to switch to ETT, N/V, pulmonary aspiration. Questions answered. )        Anesthesia Quick Evaluation

## 2016-11-03 NOTE — Discharge Summary (Signed)
ELECTROPHYSIOLOGY PROCEDURE DISCHARGE SUMMARY    Patient ID: Patricia Wagner,  MRN: [id number], DOB/AGE: 1950/12/28 65 y.o.  Admit date: 10/31/2016 Discharge date: 11/04/2016  Primary Care Physician: Cathlean Cower, MD Primary Cardiologist: Harrington Challenger Electrophysiologist: Jakell Trusty  Primary Discharge Diagnosis:  Persistent atrial fibrillation  Secondary Discharge Diagnosis: 1.  Acute on chronic systolic heart failure 2.  Hypertension 3.  Hyperlipidemia 4.  Obesity 5.  Non ischemic cardiomyopathy  6.  NSVT  Allergies  Allergen Reactions  . Ace Inhibitors Palpitations    Procedures This Admission:  1.  Transesophageal echocardiogram on 10/31/16 by Dr Harrington Challenger.  This study demonstrated EF 20%, no LAA thrombus 2.  Cardioversion on 11/03/16 by Dr Meda Coffee. This study demonstrated successful conversion to SR  Brief HPI/Hospital Course:  Patricia Wagner is a 65 y.o. female with a past medical history significant for persistent atrial fibrillation, non ischemic cardiomyopathy, hyperlipidemia, and hypertension.  Because of cardiomyopathy, it was felt that she would require AAD therapy to maintain SR.  She was concerned about cost for Tikosyn and so Sotalol was initiated. She underwent cardioversion on 11/03/16 with restoration of sinus rhythm.  Her fluid status was improved on discharge but will need close outpatient follow up.  We will arrange outpatient follow up in the AF clinic later this week.   She will need repeat assessment of EF in 3 months.  If EF remains depressed at that time, consider ICD.    Physical Exam: Vitals:   11/03/16 1535 11/03/16 2116 11/04/16 0528 11/04/16 0938  BP: 118/64 117/63 130/63 (!) 160/82  Pulse: (!) 54 64 (!) 53   Resp:  16 16   Temp:  98.4 F (36.9 C) 98.1 F (36.7 C)   TempSrc:  Oral Oral   SpO2:  94% 90%   Weight:   237 lb 3.2 oz (107.6 kg)   Height:        GEN- The patient is well appearing, alert and oriented x 3 today.   HEENT: normocephalic,  atraumatic; sclera clear, conjunctiva pink; hearing intact; oropharynx clear; neck supple  Lungs- Clear to ausculation bilaterally, normal work of breathing.  No wheezes, rales, rhonchi Heart- Regular rate and rhythm, no murmurs, rubs or gallops  GI- soft, non-tender, non-distended, bowel sounds present  Extremities- no clubbing, cyanosis, or edema; DP/PT/radial pulses 2+ bilaterally MS- no significant deformity or atrophy Skin- warm and dry, no rash or lesion Psych- euthymic mood, full affect Neuro- strength and sensation are intact    Labs:   Lab Results  Component Value Date   WBC 11.6 (H) 10/31/2016   HGB 12.6 10/31/2016   HCT 39.5 10/31/2016   MCV 93.2 10/31/2016   PLT 178 10/31/2016    Recent Labs Lab 10/31/16 1556  11/04/16 0826  NA 140  < > 141  K 4.1  < > 3.8  CL 106  < > 96*  CO2 28  < > 38*  BUN 18  < > 19  CREATININE 1.14*  < > 1.16*  CALCIUM 8.5*  < > 9.0  PROT 6.4*  --   --   BILITOT 1.1  --   --   ALKPHOS 51  --   --   ALT 39  --   --   AST 21  --   --   GLUCOSE 118*  < > 109*  < > = values in this interval not displayed.   Discharge Medications:  Current Discharge Medication List    START taking  these medications   Details  furosemide (LASIX) 40 MG tablet Take 1 tablet (40 mg total) by mouth daily. Qty: 30 tablet, Refills: 1    losartan (COZAAR) 25 MG tablet Take 1 tablet (25 mg total) by mouth daily. Qty: 30 tablet, Refills: 1    magnesium oxide (MAG-OX) 400 (241.3 Mg) MG tablet Take 1 tablet (400 mg total) by mouth 2 (two) times daily. Qty: 60 tablet, Refills: 1    potassium chloride SA (K-DUR,KLOR-CON) 20 MEQ tablet Take 1 tablet (20 mEq total) by mouth daily. Qty: 30 tablet, Refills: 1    sotalol (BETAPACE) 120 MG tablet Take 1 tablet (120 mg total) by mouth every 12 (twelve) hours. Qty: 60 tablet, Refills: 1      CONTINUE these medications which have NOT CHANGED   Details  acetaminophen (TYLENOL) 325 MG tablet Take 2 tablets (650 mg  total) by mouth every 4 (four) hours as needed for headache or mild pain.    apixaban (ELIQUIS) 5 MG TABS tablet Take 1 tablet (5 mg total) by mouth 2 (two) times daily. Qty: 60 tablet, Refills: 6    digoxin (LANOXIN) 0.125 MG tablet Take 1 tablet (0.125 mg total) by mouth daily. Qty: 30 tablet, Refills: 6    metoprolol (LOPRESSOR) 50 MG tablet Take 1 tablet (50 mg total) by mouth 2 (two) times daily. Qty: 60 tablet, Refills: 6    rosuvastatin (CRESTOR) 10 MG tablet Take 1 tablet (10 mg total) by mouth daily. Qty: 90 tablet, Refills: 3        Disposition: Pt is being discharged home today in good condition. Discharge Instructions    Diet - low sodium heart healthy    Complete by:  As directed    Increase activity slowly    Complete by:  As directed      Follow-up Information    [facility] Follow up on 11/06/2016.   Specialty:  Cardiology Why:  at [facility] information: [address] [id number] [place] Nezperce, MD Follow up on 12/15/2016.   Specialty:  Cardiology Why:  at [facility] information: [address] [place] [address] [phone]           Duration of Discharge Encounter: Greater than 30 minutes including physician time.  Signed, Chanetta Marshall, NP 11/04/2016 11:37 AM  I have seen, examined the patient, and reviewed the above assessment and plan.  On exam, RRR.  Ambulatory.  JVP 7cm, lungs are clear.  Changes to above are made where necessary.  Pt with known cardiomyopathy.  Will follow closely with Dr [name] and the afib clinic.  If EF remains depressed with sinus rhythm, additional workup may be required.  DC to home today.    Co Sign: Thompson Grayer, MD 11/04/2016 11:43 AM

## 2016-11-03 NOTE — Interval H&P Note (Signed)
History and Physical Interval Note:  11/03/2016 11:23 AM  Patricia Wagner  has presented today for surgery, with the diagnosis of a fib  The various methods of treatment have been discussed with the patient and family. After consideration of risks, benefits and other options for treatment, the patient has consented to  Procedure(s): CARDIOVERSION (N/A) as a surgical intervention .  The patient's history has been reviewed, patient examined, no change in status, stable for surgery.  I have reviewed the patient's chart and labs.  Questions were answered to the patient's satisfaction.     [name]

## 2016-11-03 NOTE — Transfer of Care (Signed)
Immediate Anesthesia Transfer of Care Note  Patient: Patricia Wagner  Procedure(s) Performed: Procedure(s): CARDIOVERSION (N/A)  Patient Location: Endoscopy Unit  Anesthesia Type:MAC  Level of Consciousness: awake and alert   Airway & Oxygen Therapy: Patient Spontanous Breathing and Patient connected to nasal cannula oxygen  Post-op Assessment: Report given to RN  Post vital signs: Reviewed and stable  Last Vitals:  Vitals:   11/03/16 1144 11/03/16 1145  BP:    Pulse: (!) 53 (!) 52  Resp: (!) 22 (!) 28  Temp:      Last Pain:  Vitals:   11/03/16 1114  TempSrc: Oral  PainSc:       Patients Stated Pain Goal: 0 (99/41/29 0475)  Complications: No apparent anesthesia complications

## 2016-11-03 NOTE — CV Procedure (Signed)
    Cardioversion Note  Patricia Wagner [id number] [birth year]  Procedure: DC Cardioversion Indications: atrial fibrillation  Procedure Details Consent: Obtained Time Out: Verified patient identification, verified procedure, site/side was marked, verified correct patient position, special equipment/implants available, Radiology Safety Procedures followed,  medications/allergies/relevent history reviewed, required imaging and test results available.  Performed  The patient has been on adequate anticoagulation.  The patient received IV propofol 50 mg and IV Lidocaine 100 mg administered by anesthesia staff for sedation.  Synchronous cardioversion was performed at 120 joules.  The cardioversion was successful.   Complications: No apparent complications Patient did tolerate procedure well.   Patricia Dawley, MD, [facility] 11/03/2016, 11:51 AM

## 2016-11-03 NOTE — Discharge Instructions (Addendum)
Ok to return to work on Monday, 11/10/16  You have an appointment set up with the [facility].  Multiple studies have shown that being followed by a dedicated atrial fibrillation clinic in addition to the standard care you receive from your other physicians improves health. We believe that enrollment in the atrial fibrillation clinic will allow [place] to better care for you.   The phone number to the [facility] is [phone]. The clinic is staffed Monday through Friday from 8:30am to 5pm.  Parking Directions: The clinic is located in the Heart and Vascular Building connected to [facility]. 1)From [address] turn on to [address] and go to the 3rd entrance  (Heart and Vascular entrance) on the right. 2)Look to the right for Heart &Vascular Parking Garage. 3)A code for the entrance is required please call the clinic to receive this.   4)Take the elevators to the 1st floor. Registration is in the room with the glass walls at the end of the hallway.  If you have any trouble parking or locating the clinic, please dont hesitate to call [phone].    Information on my medicine - ELIQUIS (apixaban)  This medication education was reviewed with me or my healthcare representative as part of my discharge preparation.  The pharmacist that spoke with me during my hospital stay was:  Cruz Condon, PharmD  Why was Eliquis prescribed for you? Eliquis was prescribed for you to reduce the risk of a blood clot forming that can cause a stroke if you have a medical condition called atrial fibrillation (a type of irregular heartbeat).  What do You need to know about Eliquis ? Take your Eliquis TWICE DAILY - one tablet in the morning and one tablet in the evening with or without food. If you have difficulty swallowing the tablet whole please discuss with your pharmacist how to take the medication safely.  Take Eliquis exactly as prescribed by your doctor and  DO NOT stop taking Eliquis without talking to the doctor who prescribed the medication.  Stopping may increase your risk of developing a stroke.  Refill your prescription before you run out.  After discharge, you should have regular check-up appointments with your healthcare provider that is prescribing your Eliquis.  In the future your dose may need to be changed if your kidney function or weight changes by a significant amount or as you get older.  What do you do if you miss a dose? If you miss a dose, take it as soon as you remember on the same day and resume taking twice daily.  Do not take more than one dose of ELIQUIS at the same time to make up a missed dose.  Important Safety Information A possible side effect of Eliquis is bleeding. You should call your healthcare provider right away if you experience any of the following: ? Bleeding from an injury or your nose that does not stop. ? Unusual colored urine (red or dark brown) or unusual colored stools (red or black). ? Unusual bruising for unknown reasons. ? A serious fall or if you hit your head (even if there is no bleeding).  Some medicines may interact with Eliquis and might increase your risk of bleeding or clotting while on Eliquis. To help avoid this, consult your healthcare provider or pharmacist prior to using any new prescription or non-prescription medications, including herbals, vitamins, non-steroidal anti-inflammatory drugs (NSAIDs) and supplements.  This website has more information on Eliquis (apixaban): http://www.eliquis.com/eliquis/home

## 2016-11-04 LAB — BASIC METABOLIC PANEL
Anion gap: 7 (ref 5–15)
BUN: 19 mg/dL (ref 6–20)
CHLORIDE: 96 mmol/L — AB (ref 101–111)
CO2: 38 mmol/L — AB (ref 22–32)
CREATININE: 1.16 mg/dL — AB (ref 0.44–1.00)
Calcium: 9 mg/dL (ref 8.9–10.3)
GFR calc Af Amer: 57 mL/min — ABNORMAL LOW (ref >60)
GFR calc non Af Amer: 49 mL/min — ABNORMAL LOW (ref >60)
GLUCOSE: 109 mg/dL — AB (ref 65–99)
POTASSIUM: 3.8 mmol/L (ref 3.5–5.1)
SODIUM: 141 mmol/L (ref 135–145)

## 2016-11-04 LAB — MAGNESIUM: MAGNESIUM: 2.1 mg/dL (ref 1.7–2.4)

## 2016-11-04 MED ORDER — SOTALOL HCL 120 MG PO TABS: 120.0000 mg | ORAL_TABLET | Freq: Two times a day (BID) | ORAL | 1 refills | Status: DC

## 2016-11-04 MED ORDER — POTASSIUM CHLORIDE CRYS ER 20 MEQ PO TBCR: 20.0000 meq | EXTENDED_RELEASE_TABLET | Freq: Every day | ORAL | 1 refills | Status: DC

## 2016-11-04 MED ORDER — LOSARTAN POTASSIUM 25 MG PO TABS: 25.0000 mg | ORAL_TABLET | Freq: Every day | ORAL | 1 refills | Status: DC

## 2016-11-04 MED ORDER — FUROSEMIDE 40 MG PO TABS: 40.0000 mg | ORAL_TABLET | Freq: Every day | ORAL | 1 refills | Status: DC

## 2016-11-04 MED ORDER — MAGNESIUM OXIDE 400 (241.3 MG) MG PO TABS: 400.0000 mg | ORAL_TABLET | Freq: Two times a day (BID) | ORAL | 1 refills | Status: DC

## 2016-11-04 NOTE — Progress Notes (Addendum)
Prescriptions given to patient.  

## 2016-11-04 NOTE — Progress Notes (Signed)
CCMD called to report that pt had a 14 beat run of wide QRS SVT. Pt QTC was 0.38 and had just received Betapace. Upon assessment pt was asleep in bed and found to be easy to arouse. Pt stated she was fine and wasn't experiencing any CP or other cardiac related symptoms. Will continue to monitor and assess for any further rhythm changes.

## 2016-11-04 NOTE — Progress Notes (Signed)
Discharge instructions reviewed with patient. Patient has no questions at this time. IV removed.

## 2016-11-04 NOTE — Care Management Note (Signed)
Case Management Note  Patient Details  Name: Patricia Wagner MRN: [id number] Date of Birth: 05-13-1951  Subjective/Objective:  Pt presented for CHF exacerbation. PTA independent. Pt previously started Eliquis. Co pay was checked and came back as $70.00 co pay. Pt had concerns and CM did call to [facility] on [address] and cost is $135.00 for 30 day supply. Previous visit pt received 30 day free card. This admission CM did provide pt with the co pay card to possibly get price lowered to $10.00.                   Action/Plan: CM did speak with pt in regards that she will need to contact her insurance provider to inquire about cost. No further needs identified at this time. Will continue to monitor.   Expected Discharge Date:                  Expected Discharge Plan:  Home/Self Care  In-House Referral:  NA  Discharge planning Services  CM Consult  Post Acute Care Choice:  NA Choice offered to:  NA  DME Arranged:  N/A DME Agency:  NA  HH Arranged:  NA HH Agency:  NA  Status of Service:  Completed, signed off  If discussed at [place] of Stay Meetings, dates discussed:    Additional Comments:  Bethena Roys, RN 11/04/2016, 11:07 AM

## 2016-11-06 ENCOUNTER — Other Ambulatory Visit: Payer: Self-pay | Admitting: Internal Medicine

## 2016-11-06 ENCOUNTER — Encounter (HOSPITAL_COMMUNITY): Payer: Self-pay | Admitting: Nurse Practitioner

## 2016-11-06 ENCOUNTER — Ambulatory Visit (HOSPITAL_COMMUNITY)
Admit: 2016-11-06 | Discharge: 2016-11-06 | Disposition: A | Discharge status: Home / Self Care | Payer: Managed Care, Other (non HMO) | Attending: Nurse Practitioner | Admitting: Nurse Practitioner

## 2016-11-06 VITALS — BP 158/98 | HR 112 | Ht 65.0 in | Wt 242.4 lb

## 2016-11-06 DIAGNOSIS — I429 Cardiomyopathy, unspecified: Secondary | ICD-10-CM | POA: Insufficient documentation

## 2016-11-06 DIAGNOSIS — I2510 Atherosclerotic heart disease of native coronary artery without angina pectoris: Secondary | ICD-10-CM | POA: Diagnosis not present

## 2016-11-06 DIAGNOSIS — I110 Hypertensive heart disease with heart failure: Secondary | ICD-10-CM | POA: Insufficient documentation

## 2016-11-06 DIAGNOSIS — I493 Ventricular premature depolarization: Secondary | ICD-10-CM | POA: Diagnosis not present

## 2016-11-06 DIAGNOSIS — M1990 Unspecified osteoarthritis, unspecified site: Secondary | ICD-10-CM | POA: Insufficient documentation

## 2016-11-06 DIAGNOSIS — I509 Heart failure, unspecified: Secondary | ICD-10-CM | POA: Diagnosis not present

## 2016-11-06 DIAGNOSIS — Z87891 Personal history of nicotine dependence: Secondary | ICD-10-CM | POA: Insufficient documentation

## 2016-11-06 DIAGNOSIS — K5790 Diverticulosis of intestine, part unspecified, without perforation or abscess without bleeding: Secondary | ICD-10-CM | POA: Insufficient documentation

## 2016-11-06 DIAGNOSIS — I7300 Raynaud's syndrome without gangrene: Secondary | ICD-10-CM | POA: Diagnosis not present

## 2016-11-06 DIAGNOSIS — G4710 Hypersomnia, unspecified: Secondary | ICD-10-CM | POA: Diagnosis not present

## 2016-11-06 DIAGNOSIS — I481 Persistent atrial fibrillation: Secondary | ICD-10-CM | POA: Diagnosis present

## 2016-11-06 DIAGNOSIS — I4892 Unspecified atrial flutter: Secondary | ICD-10-CM

## 2016-11-06 DIAGNOSIS — E559 Vitamin D deficiency, unspecified: Secondary | ICD-10-CM | POA: Diagnosis not present

## 2016-11-06 DIAGNOSIS — E785 Hyperlipidemia, unspecified: Secondary | ICD-10-CM | POA: Diagnosis not present

## 2016-11-06 LAB — MAGNESIUM: Magnesium: 2.4 mg/dL (ref 1.7–2.4)

## 2016-11-06 LAB — BASIC METABOLIC PANEL
ANION GAP: 6 (ref 5–15)
BUN: 19 mg/dL (ref 6–20)
CALCIUM: 9.1 mg/dL (ref 8.9–10.3)
CO2: 35 mmol/L — AB (ref 22–32)
CREATININE: 1.29 mg/dL — AB (ref 0.44–1.00)
Chloride: 100 mmol/L — ABNORMAL LOW (ref 101–111)
GFR calc Af Amer: 50 mL/min — ABNORMAL LOW (ref >60)
GFR, EST NON AFRICAN AMERICAN: 43 mL/min — AB (ref >60)
GLUCOSE: 109 mg/dL — AB (ref 65–99)
Potassium: 4.2 mmol/L (ref 3.5–5.1)
Sodium: 141 mmol/L (ref 135–145)

## 2016-11-06 MED ORDER — METOPROLOL TARTRATE 50 MG PO TABS: 75.0000 mg | ORAL_TABLET | Freq: Two times a day (BID) | ORAL | 6 refills | Status: DC

## 2016-11-06 NOTE — Patient Instructions (Signed)
Your physician has recommended you make the following change in your medication:  1)Increase metoprolol to 75mg  twice a day (1 and 1/2 tablets -- twice a day)

## 2016-11-06 NOTE — Progress Notes (Addendum)
Primary Care Physician: Cathlean Cower, MD Referring Physician: [facility] f/u EP: Dr. Rodriguez Hevia [name] is a 65 y.o. female with a h/o  past medical history significant for persistent atrial fibrillation, non ischemic cardiomyopathy, hyperlipidemia, and hypertension.  Because of cardiomyopathy, it was felt that she would require AAD therapy to maintain SR.  She was concerned about cost for Tikosyn and so Sotalol was initiated. She underwent cardioversion on 11/03/16 with restoration of sinus rhythm.  Her fluid status was improved on discharge but will need close outpatient follow up.    F/u in afib clinic and is found to be in atrial flutter with RVR at 112 bpm. Pt is unaware of heart beat. Fluid status is stable per pt and is no longer having LLE, PND/orthopnea..   Today, she denies symptoms of palpitations, chest pain, shortness of breath, orthopnea, PND, lower extremity edema, dizziness, presyncope, syncope, or neurologic sequela. The patient is tolerating medications without difficulties and is otherwise without complaint today.   Past Medical History:  Diagnosis Date  . Arthritis   . CAD in native artery, s/p cardiac cath with non obstructive CAD 10/24/2016  . CHF (congestive heart failure) ([place])   . DIVERTICULITIS, HX OF 07/25/2007  . DIVERTICULOSIS, COLON 07/22/2007  . Dyspnea   . Edema, peripheral    a. chronic BLE edema, R>L. Prior trauma from dog attack and accident.  Marland Kitchen HYPERLIPIDEMIA 02/03/2008  . Hypersomnia    declines w/u  . Hypertension   . MENOPAUSAL DISORDER 01/09/2011  . Morbid obesity ([place]) 07/22/2007  . NICM (nonischemic cardiomyopathy) ([place]) 10/24/2016  . Raynaud's syndrome 07/22/2007  . THYROID NODULE, RIGHT 01/04/2010  . VITAMIN D DEFICIENCY 01/09/2011   Qualifier: Diagnosis of  By: [name], [name]    Past Surgical History:  Procedure Laterality Date  . CARDIAC CATHETERIZATION N/A 10/23/2016   Procedure: Left Heart Cath and Coronary Angiography;  Surgeon: [name], MD;  Location: [place] CV LAB;  Service: Cardiovascular;  Laterality: N/A;  . CARDIOVERSION N/A 10/31/2016   Procedure: CARDIOVERSION;  Surgeon: [name], MD;  Location[facility] ENDOSCOPY;  Service: Cardiovascular;  Laterality: N/A;  . CARDIOVERSION N/A 11/03/2016   Procedure: CARDIOVERSION;  Surgeon: [name], MD;  Location: [place];  Service: Cardiovascular;  Laterality: N/A;  . CHOLECYSTECTOMY    . COLONOSCOPY    . PARTIAL HYSTERECTOMY     1 OVARY LEFT  . POLYPECTOMY    . TEE WITHOUT CARDIOVERSION N/A 10/31/2016   Procedure: TRANSESOPHAGEAL ECHOCARDIOGRAM (TEE);  Surgeon: [name], MD;  Location: [facility] ENDOSCOPY;  Service: Cardiovascular;  Laterality: N/A;    Current Outpatient Prescriptions  Medication Sig Dispense Refill  . acetaminophen (TYLENOL) 325 MG tablet Take 2 tablets (650 mg total) by mouth every 4 (four) hours as needed for headache or mild pain. (Patient taking differently: Take 325 mg by mouth every 4 (four) hours as needed for headache or mild pain. )    . apixaban (ELIQUIS) 5 MG TABS tablet Take 1 tablet (5 mg total) by mouth 2 (two) times daily. 60 tablet 6  . digoxin (LANOXIN) 0.125 MG tablet Take 1 tablet (0.125 mg total) by mouth daily. 30 tablet 6  . furosemide (LASIX) 20 MG tablet TAKE 1 TABLET BY MOUTH TWICE A WEEK AS NEEDED FOR SWELLING 60 tablet 0  . furosemide (LASIX) 40 MG tablet Take 1 tablet (40 mg total) by mouth daily. 30 tablet 1  . losartan (COZAAR) 25 MG tablet Take  1 tablet (25 mg total) by mouth daily. 30 tablet 1  . magnesium oxide (MAG-OX) 400 (241.3 Mg) MG tablet Take 1 tablet (400 mg total) by mouth 2 (two) times daily. 60 tablet 1  . metoprolol (LOPRESSOR) 50 MG tablet Take 1.5 tablets (75 mg total) by mouth 2 (two) times daily. 60 tablet 6  . potassium chloride SA (K-DUR,KLOR-CON) 20 MEQ tablet Take 1 tablet (20 mEq total) by mouth daily. 30 tablet 1  . rosuvastatin (CRESTOR) 10 MG tablet Take 1 tablet (10 mg total) by mouth  daily. 90 tablet 3  . sotalol (BETAPACE) 120 MG tablet Take 1 tablet (120 mg total) by mouth every 12 (twelve) hours. 60 tablet 1   No current facility-administered medications for this encounter.     Allergies  Allergen Reactions  . Ace Inhibitors Palpitations    Social History   Social History  . Marital status: Single    Spouse name: N/A  . Number of children: 1  . Years of education: N/A   Occupational History  . Printing     Social History Main Topics  . Smoking status: Former Smoker    Packs/day: 0.50    Types: Cigarettes    Quit date: 10/23/2016  . Smokeless tobacco: Never Used     Comment: Smoked for 50 years  . Alcohol use 2.4 oz/week    4 Cans of beer per week     Comment: Intermittent  . Drug use: No  . Sexual activity: Not on file   Other Topics Concern  . Not on file   Social History Narrative  . No narrative on file    Family History  Problem Relation Age of Onset  . Asthma Mother   . Diabetes Father   . Heart disease Father     Died of presumed heart attack - 73s  . Lung disease Sister   . Heart disease Sister     Twin sister has heart issue, unclear what kind  . Thyroid disease Neg Hx     ROS- All systems are reviewed and negative except as per the HPI above  Physical Exam: Vitals:   11/06/16 1446  BP: (!) 158/98  Pulse: (!) 112  Weight: 242 lb 6.4 oz (110 kg)  Height: 5\' 5"  (1.651 m)   Wt Readings from Last 3 Encounters:  11/06/16 242 lb 6.4 oz (110 kg)  11/04/16 237 lb 3.2 oz (107.6 kg)  10/28/16 249 lb (112.9 kg)    Labs: Lab Results  Component Value Date   NA 141 11/04/2016   K 3.8 11/04/2016   CL 96 (L) 11/04/2016   CO2 38 (H) 11/04/2016   GLUCOSE 109 (H) 11/04/2016   BUN 19 11/04/2016   CREATININE 1.16 (H) 11/04/2016   CALCIUM 9.0 11/04/2016   MG 2.1 11/04/2016   Lab Results  Component Value Date   INR 1.1 10/28/2016   Lab Results  Component Value Date   CHOL 153 10/21/2016   HDL 46 10/21/2016   LDLCALC  95 10/21/2016   TRIG 58 10/21/2016     GEN- The patient is well appearing, alert and oriented x 3 today.   Head- normocephalic, atraumatic Eyes-  Sclera clear, conjunctiva pink Ears- hearing intact Oropharynx- clear Neck- supple, no JVP Lymph- no cervical lymphadenopathy Lungs- Clear to ausculation bilaterally, normal work of breathing Heart- rapid irregular rate and rhythm, no murmurs, rubs or gallops, PMI not laterally displaced GI- soft, NT, ND, + BS Extremities- no clubbing, cyanosis, or  edema MS- no significant deformity or atrophy Skin- no rash or lesion Psych- euthymic mood, full affect Neuro- strength and sensation are intact  EKG-aflutter at 112 bpm, qrs int 104 ms, qtc 466 ms Epic records reviewed  - Left ventricle: The cavity size was normal. Wall thickness was   normal. Systolic function was severely reduced. The estimated   ejection fraction was in the range of 20% to 25%. Akinesis of the   basal-midanteroseptal myocardium. - Mitral valve: There was mild regurgitation. - Left atrium: The atrium was severely dilated.50 mm - Right atrium: The atrium was moderately dilated.   Assessment and Plan: 1. Afib/flutter ERAF after cardioversion and loading on sotalol Will increase metoprolol to 75 mg bid  Bmet/mag today  2. HTN Elevated Increase in BB should help  3. Acute on chronic HF SR is preferable to  help  improve EF  Pt is not having any symptoms of fluid overload and is feeling better Avoid salt  F/u on Monday  Leira Regino C. Araiya Tilmon, [facility] [address] [place], [place] [address] [phone]

## 2016-11-10 ENCOUNTER — Ambulatory Visit (HOSPITAL_COMMUNITY)
Admission: RE | Admit: 2016-11-10 | Discharge: 2016-11-10 | Disposition: A | Discharge status: Home / Self Care | Payer: Managed Care, Other (non HMO) | Source: Ambulatory Visit | Attending: Nurse Practitioner | Admitting: Nurse Practitioner

## 2016-11-10 ENCOUNTER — Encounter (HOSPITAL_COMMUNITY): Payer: Self-pay | Admitting: Nurse Practitioner

## 2016-11-10 VITALS — BP 140/92 | HR 81 | Ht 65.0 in | Wt 240.6 lb

## 2016-11-10 DIAGNOSIS — I7300 Raynaud's syndrome without gangrene: Secondary | ICD-10-CM | POA: Diagnosis not present

## 2016-11-10 DIAGNOSIS — E559 Vitamin D deficiency, unspecified: Secondary | ICD-10-CM | POA: Diagnosis not present

## 2016-11-10 DIAGNOSIS — I2510 Atherosclerotic heart disease of native coronary artery without angina pectoris: Secondary | ICD-10-CM | POA: Insufficient documentation

## 2016-11-10 DIAGNOSIS — I110 Hypertensive heart disease with heart failure: Secondary | ICD-10-CM | POA: Diagnosis not present

## 2016-11-10 DIAGNOSIS — R197 Diarrhea, unspecified: Secondary | ICD-10-CM | POA: Diagnosis not present

## 2016-11-10 DIAGNOSIS — I509 Heart failure, unspecified: Secondary | ICD-10-CM | POA: Diagnosis not present

## 2016-11-10 DIAGNOSIS — Z87891 Personal history of nicotine dependence: Secondary | ICD-10-CM | POA: Diagnosis not present

## 2016-11-10 DIAGNOSIS — I481 Persistent atrial fibrillation: Secondary | ICD-10-CM | POA: Diagnosis present

## 2016-11-10 DIAGNOSIS — N951 Menopausal and female climacteric states: Secondary | ICD-10-CM | POA: Insufficient documentation

## 2016-11-10 DIAGNOSIS — Z9049 Acquired absence of other specified parts of digestive tract: Secondary | ICD-10-CM | POA: Insufficient documentation

## 2016-11-10 DIAGNOSIS — Z780 Asymptomatic menopausal state: Secondary | ICD-10-CM | POA: Insufficient documentation

## 2016-11-10 DIAGNOSIS — R609 Edema, unspecified: Secondary | ICD-10-CM | POA: Insufficient documentation

## 2016-11-10 DIAGNOSIS — I429 Cardiomyopathy, unspecified: Secondary | ICD-10-CM | POA: Insufficient documentation

## 2016-11-10 DIAGNOSIS — I4891 Unspecified atrial fibrillation: Secondary | ICD-10-CM | POA: Insufficient documentation

## 2016-11-10 DIAGNOSIS — E785 Hyperlipidemia, unspecified: Secondary | ICD-10-CM | POA: Diagnosis not present

## 2016-11-10 DIAGNOSIS — G4710 Hypersomnia, unspecified: Secondary | ICD-10-CM | POA: Diagnosis not present

## 2016-11-10 DIAGNOSIS — I4819 Other persistent atrial fibrillation: Secondary | ICD-10-CM

## 2016-11-10 NOTE — Patient Instructions (Signed)
Your physician has recommended you make the following change in your medication:  1)Stop magnesium

## 2016-11-10 NOTE — Progress Notes (Signed)
Primary Care Physician: Cathlean Cower, MD Referring Physician: [facility] f/u EP: Dr. [place] [name] is a 65 y.o. female with a h/o  past medical history significant for persistent atrial fibrillation, non ischemic cardiomyopathy, hyperlipidemia, and hypertension.  Because of cardiomyopathy, it was felt that she would require AAD therapy to maintain SR.  She was concerned about cost for Tikosyn and so Sotalol was initiated. She underwent cardioversion on 11/03/16 with restoration of sinus rhythm.  Her fluid status was improved on discharge but will need close outpatient follow up.    F/u in afib clinic and is found to be in atrial flutter with RVR at 112 bpm. Pt is unaware of heart beat. Fluid status is stable per pt and is no longer having LLE, PND/orthopnea.  F/u afib clinic 12/20. She is still in afib but rate is controlled. BB was increased on last visit. She does not want another cardioversion at this time. She feels well and would like to return to work. She is having diarrhea which is probably the effects of the mag supplement.   Today, she denies symptoms of palpitations, chest pain, shortness of breath, orthopnea, PND, lower extremity edema, dizziness, presyncope, syncope, or neurologic sequela. Positive for diarrhea. The patient is tolerating medications without difficulties and is otherwise without complaint today.   Past Medical History:  Diagnosis Date  . Arthritis   . CAD in native artery, s/p cardiac cath with non obstructive CAD 10/24/2016  . CHF (congestive heart failure) ([place])   . DIVERTICULITIS, HX OF 07/25/2007  . DIVERTICULOSIS, COLON 07/22/2007  . Dyspnea   . Edema, peripheral    a. chronic BLE edema, R>L. Prior trauma from dog attack and accident.  Marland Kitchen HYPERLIPIDEMIA 02/03/2008  . Hypersomnia    declines w/u  . Hypertension   . MENOPAUSAL DISORDER 01/09/2011  . Morbid obesity ([place]) 07/22/2007  . NICM (nonischemic cardiomyopathy) ([place]) 10/24/2016  . Raynaud's syndrome  07/22/2007  . THYROID NODULE, RIGHT 01/04/2010  . VITAMIN D DEFICIENCY 01/09/2011   Qualifier: Diagnosis of  By: [name], [name]    Past Surgical History:  Procedure Laterality Date  . CARDIAC CATHETERIZATION N/A 10/23/2016   Procedure: Left Heart Cath and Coronary Angiography;  Surgeon: [name], MD;  Location: [place] CV LAB;  Service: Cardiovascular;  Laterality: N/A;  . CARDIOVERSION N/A 10/31/2016   Procedure: CARDIOVERSION;  Surgeon: [name], MD;  Location: [facility] ENDOSCOPY;  Service: Cardiovascular;  Laterality: N/A;  . CARDIOVERSION N/A 11/03/2016   Procedure: CARDIOVERSION;  Surgeon: [name], MD;  Location: [place];  Service: Cardiovascular;  Laterality: N/A;  . CHOLECYSTECTOMY    . COLONOSCOPY    . PARTIAL HYSTERECTOMY     1 OVARY LEFT  . POLYPECTOMY    . TEE WITHOUT CARDIOVERSION N/A 10/31/2016   Procedure: TRANSESOPHAGEAL ECHOCARDIOGRAM (TEE);  Surgeon: [name], MD;  Location: [facility] ENDOSCOPY;  Service: Cardiovascular;  Laterality: N/A;    Current Outpatient Prescriptions  Medication Sig Dispense Refill  . acetaminophen (TYLENOL) 325 MG tablet Take 2 tablets (650 mg total) by mouth every 4 (four) hours as needed for headache or mild pain. (Patient taking differently: Take 325 mg by mouth every 4 (four) hours as needed for headache or mild pain. )    . apixaban (ELIQUIS) 5 MG TABS tablet Take 1 tablet (5 mg total) by mouth 2 (two) times daily. 60 tablet 6  . digoxin (LANOXIN) 0.125 MG tablet Take 1 tablet (0.125 mg total)  by mouth daily. 30 tablet 6  . furosemide (LASIX) 40 MG tablet Take 1 tablet (40 mg total) by mouth daily. 30 tablet 1  . losartan (COZAAR) 25 MG tablet Take 1 tablet (25 mg total) by mouth daily. 30 tablet 1  . metoprolol (LOPRESSOR) 50 MG tablet Take 1.5 tablets (75 mg total) by mouth 2 (two) times daily. 60 tablet 6  . potassium chloride SA (K-DUR,KLOR-CON) 20 MEQ tablet Take 1 tablet (20 mEq total) by mouth daily. 30 tablet 1  .  rosuvastatin (CRESTOR) 10 MG tablet Take 1 tablet (10 mg total) by mouth daily. 90 tablet 3  . sotalol (BETAPACE) 120 MG tablet Take 1 tablet (120 mg total) by mouth every 12 (twelve) hours. 60 tablet 1   No current facility-administered medications for this encounter.     Allergies  Allergen Reactions  . Ace Inhibitors Palpitations    Social History   Social History  . Marital status: Single    Spouse name: N/A  . Number of children: 1  . Years of education: N/A   Occupational History  . Printing     Social History Main Topics  . Smoking status: Former Smoker    Packs/day: 0.50    Types: Cigarettes    Quit date: 10/23/2016  . Smokeless tobacco: Never Used     Comment: Smoked for 50 years  . Alcohol use 2.4 oz/week    4 Cans of beer per week     Comment: Intermittent  . Drug use: No  . Sexual activity: Not on file   Other Topics Concern  . Not on file   Social History Narrative  . No narrative on file    Family History  Problem Relation Age of Onset  . Asthma Mother   . Diabetes Father   . Heart disease Father     Died of presumed heart attack - 25s  . Lung disease Sister   . Heart disease Sister     Twin sister has heart issue, unclear what kind  . Thyroid disease Neg Hx     ROS- All systems are reviewed and negative except as per the HPI above  Physical Exam: Vitals:   11/10/16 1340  BP: (!) 140/92  Pulse: 81  Weight: 240 lb 9.6 oz (109.1 kg)  Height: 5\' 5"  (1.651 m)   Wt Readings from Last 3 Encounters:  11/10/16 240 lb 9.6 oz (109.1 kg)  11/06/16 242 lb 6.4 oz (110 kg)  11/04/16 237 lb 3.2 oz (107.6 kg)    Labs: Lab Results  Component Value Date   NA 141 11/06/2016   K 4.2 11/06/2016   CL 100 (L) 11/06/2016   CO2 35 (H) 11/06/2016   GLUCOSE 109 (H) 11/06/2016   BUN 19 11/06/2016   CREATININE 1.29 (H) 11/06/2016   CALCIUM 9.1 11/06/2016   MG 2.4 11/06/2016   Lab Results  Component Value Date   INR 1.1 10/28/2016   Lab Results    Component Value Date   CHOL 153 10/21/2016   HDL 46 10/21/2016   LDLCALC 95 10/21/2016   TRIG 58 10/21/2016     GEN- The patient is well appearing, alert and oriented x 3 today.   Head- normocephalic, atraumatic Eyes-  Sclera clear, conjunctiva pink Ears- hearing intact Oropharynx- clear Neck- supple, no JVP Lymph- no cervical lymphadenopathy Lungs- Clear to ausculation bilaterally, normal work of breathing Heart-  irregular rate and rhythm, no murmurs, rubs or gallops, PMI not laterally displaced GI-  soft, NT, ND, + BS Extremities- no clubbing, cyanosis, or edema MS- no significant deformity or atrophy Skin- no rash or lesion Psych- euthymic mood, full affect Neuro- strength and sensation are intact  EKG-afib at 81 bpm, qrs int 106 ms, qtc 462 ms Epic records reviewed  - Left ventricle: The cavity size was normal. Wall thickness was   normal. Systolic function was severely reduced. The estimated   ejection fraction was in the range of 20% to 25%. Akinesis of the   basal-midanteroseptal myocardium. - Mitral valve: There was mild regurgitation. - Left atrium: The atrium was severely dilated.50 mm - Right atrium: The atrium was moderately dilated.   Assessment and Plan: 1. Afib/flutter ERAF after cardioversion and loading on sotalol Continue metoprolol to 75 mg bid  Bmet/mag today  2. HTN Improved with increase in BB  3. Acute on chronic HF Fluid status is stable Pt is not having any symptoms of fluid overload and is feeling better Avoid salt Continue diuretic Can return to work without restrictions  4. Diarrhea  Probably from magnesium supplement Can stop and see if diarrhea resolves Repeat mag level on f/u  F/u one week in afib clinic  Tramond Slinker C. Dionta Larke, [facility] [address] [place], [place] [address] [phone]

## 2016-11-13 ENCOUNTER — Encounter (HOSPITAL_COMMUNITY): Payer: Self-pay | Admitting: Nurse Practitioner

## 2016-11-13 ENCOUNTER — Ambulatory Visit (HOSPITAL_COMMUNITY)
Admission: RE | Admit: 2016-11-13 | Discharge: 2016-11-13 | Disposition: A | Discharge status: Home / Self Care | Payer: Managed Care, Other (non HMO) | Source: Ambulatory Visit | Attending: Nurse Practitioner | Admitting: Nurse Practitioner

## 2016-11-13 VITALS — BP 152/100 | HR 90 | Ht 65.0 in | Wt 242.0 lb

## 2016-11-13 DIAGNOSIS — R0600 Dyspnea, unspecified: Secondary | ICD-10-CM | POA: Diagnosis not present

## 2016-11-13 DIAGNOSIS — I4819 Other persistent atrial fibrillation: Secondary | ICD-10-CM

## 2016-11-13 DIAGNOSIS — R9431 Abnormal electrocardiogram [ECG] [EKG]: Secondary | ICD-10-CM | POA: Diagnosis not present

## 2016-11-13 DIAGNOSIS — E559 Vitamin D deficiency, unspecified: Secondary | ICD-10-CM | POA: Diagnosis not present

## 2016-11-13 DIAGNOSIS — M1990 Unspecified osteoarthritis, unspecified site: Secondary | ICD-10-CM | POA: Insufficient documentation

## 2016-11-13 DIAGNOSIS — E785 Hyperlipidemia, unspecified: Secondary | ICD-10-CM | POA: Diagnosis not present

## 2016-11-13 DIAGNOSIS — I7300 Raynaud's syndrome without gangrene: Secondary | ICD-10-CM | POA: Insufficient documentation

## 2016-11-13 DIAGNOSIS — I481 Persistent atrial fibrillation: Secondary | ICD-10-CM

## 2016-11-13 DIAGNOSIS — I110 Hypertensive heart disease with heart failure: Secondary | ICD-10-CM | POA: Insufficient documentation

## 2016-11-13 DIAGNOSIS — E041 Nontoxic single thyroid nodule: Secondary | ICD-10-CM | POA: Insufficient documentation

## 2016-11-13 DIAGNOSIS — Z780 Asymptomatic menopausal state: Secondary | ICD-10-CM | POA: Diagnosis not present

## 2016-11-13 DIAGNOSIS — R609 Edema, unspecified: Secondary | ICD-10-CM | POA: Insufficient documentation

## 2016-11-13 DIAGNOSIS — I509 Heart failure, unspecified: Secondary | ICD-10-CM | POA: Insufficient documentation

## 2016-11-13 DIAGNOSIS — I2510 Atherosclerotic heart disease of native coronary artery without angina pectoris: Secondary | ICD-10-CM | POA: Diagnosis not present

## 2016-11-13 DIAGNOSIS — I429 Cardiomyopathy, unspecified: Secondary | ICD-10-CM | POA: Diagnosis not present

## 2016-11-13 LAB — COMPREHENSIVE METABOLIC PANEL
ALBUMIN: 3.2 g/dL — AB (ref 3.5–5.0)
ALK PHOS: 54 U/L (ref 38–126)
ALT: 76 U/L — AB (ref 14–54)
AST: 114 U/L — AB (ref 15–41)
Anion gap: 6 (ref 5–15)
BILIRUBIN TOTAL: 0.7 mg/dL (ref 0.3–1.2)
BUN: 21 mg/dL — AB (ref 6–20)
CO2: 35 mmol/L — ABNORMAL HIGH (ref 22–32)
CREATININE: 1.21 mg/dL — AB (ref 0.44–1.00)
Calcium: 9.4 mg/dL (ref 8.9–10.3)
Chloride: 102 mmol/L (ref 101–111)
GFR calc Af Amer: 54 mL/min — ABNORMAL LOW (ref >60)
GFR, EST NON AFRICAN AMERICAN: 47 mL/min — AB (ref >60)
GLUCOSE: 87 mg/dL (ref 65–99)
POTASSIUM: 4.1 mmol/L (ref 3.5–5.1)
Sodium: 143 mmol/L (ref 135–145)
TOTAL PROTEIN: 6.8 g/dL (ref 6.5–8.1)

## 2016-11-13 LAB — CBC
HEMATOCRIT: 45 % (ref 36.0–46.0)
HEMOGLOBIN: 14.2 g/dL (ref 12.0–15.0)
MCH: 29.4 pg (ref 26.0–34.0)
MCHC: 31.6 g/dL (ref 30.0–36.0)
MCV: 93.2 fL (ref 78.0–100.0)
Platelets: 201 10*3/uL (ref 150–400)
RBC: 4.83 MIL/uL (ref 3.87–5.11)
RDW: 13.7 % (ref 11.5–15.5)
WBC: 7 10*3/uL (ref 4.0–10.5)

## 2016-11-13 LAB — MAGNESIUM: Magnesium: 1.9 mg/dL (ref 1.7–2.4)

## 2016-11-13 LAB — DIGOXIN LEVEL: Digoxin Level: 0.6 ng/mL — ABNORMAL LOW (ref 0.8–2.0)

## 2016-11-13 MED ORDER — METOPROLOL TARTRATE 50 MG PO TABS: 50.0000 mg | ORAL_TABLET | Freq: Two times a day (BID) | ORAL | 6 refills | Status: DC

## 2016-11-13 MED ORDER — FUROSEMIDE 40 MG PO TABS: 60.0000 mg | ORAL_TABLET | Freq: Every day | ORAL | 1 refills | Status: DC

## 2016-11-13 MED ORDER — MAGNESIUM 250 MG PO TABS: 250.0000 mg | ORAL_TABLET | Freq: Every day | ORAL | 0 refills | Status: DC

## 2016-11-13 NOTE — Progress Notes (Signed)
Primary Care Physician: Cathlean Cower, MD Referring Physician: [facility] f/u Cardiologist: Dr. Pennie Banter EP: Dr. [place] [name] is a 65 y.o. female with a h/o  past medical history significant for persistent atrial fibrillation, non ischemic cardiomyopathy, hyperlipidemia, and hypertension.  Because of cardiomyopathy, it was felt that she would require AAD therapy to maintain SR.  She was concerned about cost for Tikosyn and so Sotalol was initiated. She underwent cardioversion on 11/03/16 with restoration of sinus rhythm.  Her fluid status was improved on discharge at 239 lbs.  F/u in afib clinic 12/14 and is found to be in atrial flutter with RVR at 112 bpm. Pt is unaware of heart beat. Fluid status is stable per pt and is no longer having LLE, PND/orthopnea.  F/u afib clinic 12/20. She is still in afib but rate is controlled. BB was increased on last visit. She does not want another cardioversion at this time. She feels well and would like to return to work. She is having diarrhea which is probably the effects of the mag supplement.   F/u 12/21, she asked to be seen today for feeling poorly, more short of breath, still having diarrhea, she thinks increase of metoprolol caused loose stools and stopping magnesium for several days did not help. She continues in rate controlled afib but her weight is up 2 lbs. She states that she is being complaint with all meds and has not missed any sotalol or eliquis. She asked to return to work on last visit but is having difficulty carrying out her activities due to fatigue, and asked to be placed out of work until he feels better.  Today, she denies symptoms of palpitations, chest pain,  orthopnea, PND, lower extremity edema, dizziness, presyncope, syncope, or neurologic sequela. Positive for diarrhea and shortness of breath , weight gain, fatigue.The patient is tolerating medications without difficulties and is otherwise without complaint today.   Past Medical  History:  Diagnosis Date  . Arthritis   . CAD in native artery, s/p cardiac cath with non obstructive CAD 10/24/2016  . CHF (congestive heart failure) ([place])   . DIVERTICULITIS, HX OF 07/25/2007  . DIVERTICULOSIS, COLON 07/22/2007  . Dyspnea   . Edema, peripheral    a. chronic BLE edema, R>L. Prior trauma from dog attack and accident.  Marland Kitchen HYPERLIPIDEMIA 02/03/2008  . Hypersomnia    declines w/u  . Hypertension   . MENOPAUSAL DISORDER 01/09/2011  . Morbid obesity ([place]) 07/22/2007  . NICM (nonischemic cardiomyopathy) ([place]) 10/24/2016  . Raynaud's syndrome 07/22/2007  . THYROID NODULE, RIGHT 01/04/2010  . VITAMIN D DEFICIENCY 01/09/2011   Qualifier: Diagnosis of  By: [name], [name]    Past Surgical History:  Procedure Laterality Date  . CARDIAC CATHETERIZATION N/A 10/23/2016   Procedure: Left Heart Cath and Coronary Angiography;  Surgeon: [name], MD;  Location: [place] CV LAB;  Service: Cardiovascular;  Laterality: N/A;  . CARDIOVERSION N/A 10/31/2016   Procedure: CARDIOVERSION;  Surgeon: [name], MD;  Location: [facility] ENDOSCOPY;  Service: Cardiovascular;  Laterality: N/A;  . CARDIOVERSION N/A 11/03/2016   Procedure: CARDIOVERSION;  Surgeon: [name], MD;  Location: [place];  Service: Cardiovascular;  Laterality: N/A;  . CHOLECYSTECTOMY    . COLONOSCOPY    . PARTIAL HYSTERECTOMY     1 OVARY LEFT  . POLYPECTOMY    . TEE WITHOUT CARDIOVERSION N/A 10/31/2016   Procedure: TRANSESOPHAGEAL ECHOCARDIOGRAM (TEE);  Surgeon: [name], MD;  Location: [facility]  ENDOSCOPY;  Service: Cardiovascular;  Laterality: N/A;    Current Outpatient Prescriptions  Medication Sig Dispense Refill  . acetaminophen (TYLENOL) 325 MG tablet Take 2 tablets (650 mg total) by mouth every 4 (four) hours as needed for headache or mild pain. (Patient taking differently: Take 325 mg by mouth every 4 (four) hours as needed for headache or mild pain. )    . apixaban (ELIQUIS) 5 MG TABS tablet Take 1  tablet (5 mg total) by mouth 2 (two) times daily. 60 tablet 6  . digoxin (LANOXIN) 0.125 MG tablet Take 1 tablet (0.125 mg total) by mouth daily. 30 tablet 6  . furosemide (LASIX) 40 MG tablet Take 1.5 tablets (60 mg total) by mouth daily. 30 tablet 1  . losartan (COZAAR) 25 MG tablet Take 1 tablet (25 mg total) by mouth daily. 30 tablet 1  . metoprolol (LOPRESSOR) 50 MG tablet Take 1 tablet (50 mg total) by mouth 2 (two) times daily. 60 tablet 6  . potassium chloride SA (K-DUR,KLOR-CON) 20 MEQ tablet Take 1 tablet (20 mEq total) by mouth daily. 30 tablet 1  . rosuvastatin (CRESTOR) 10 MG tablet Take 1 tablet (10 mg total) by mouth daily. 90 tablet 3  . sotalol (BETAPACE) 120 MG tablet Take 1 tablet (120 mg total) by mouth every 12 (twelve) hours. 60 tablet 1  . Magnesium 250 MG TABS Take 1 tablet (250 mg total) by mouth daily.  0   No current facility-administered medications for this encounter.     Allergies  Allergen Reactions  . Ace Inhibitors Palpitations    Social History   Social History  . Marital status: Single    Spouse name: N/A  . Number of children: 1  . Years of education: N/A   Occupational History  . Printing     Social History Main Topics  . Smoking status: Former Smoker    Packs/day: 0.50    Types: Cigarettes    Quit date: 10/23/2016  . Smokeless tobacco: Never Used     Comment: Smoked for 50 years  . Alcohol use 2.4 oz/week    4 Cans of beer per week     Comment: Intermittent  . Drug use: No  . Sexual activity: Not on file   Other Topics Concern  . Not on file   Social History Narrative  . No narrative on file    Family History  Problem Relation Age of Onset  . Asthma Mother   . Diabetes Father   . Heart disease Father     Died of presumed heart attack - 4s  . Lung disease Sister   . Heart disease Sister     Twin sister has heart issue, unclear what kind  . Thyroid disease Neg Hx     ROS- All systems are reviewed and negative except as  per the HPI above  Physical Exam: Vitals:   11/13/16 1515  BP: (!) 152/100  Pulse: 90  Weight: 242 lb (109.8 kg)  Height: 5\' 5"  (1.651 m)   Wt Readings from Last 3 Encounters:  11/13/16 242 lb (109.8 kg)  11/10/16 240 lb 9.6 oz (109.1 kg)  11/06/16 242 lb 6.4 oz (110 kg)    Labs: Lab Results  Component Value Date   NA 141 11/06/2016   K 4.2 11/06/2016   CL 100 (L) 11/06/2016   CO2 35 (H) 11/06/2016   GLUCOSE 109 (H) 11/06/2016   BUN 19 11/06/2016   CREATININE 1.29 (H) 11/06/2016  CALCIUM 9.1 11/06/2016   MG 2.4 11/06/2016   Lab Results  Component Value Date   INR 1.1 10/28/2016   Lab Results  Component Value Date   CHOL 153 10/21/2016   HDL 46 10/21/2016   [place] 95 10/21/2016   TRIG 58 10/21/2016     GEN- The patient is well appearing, alert and oriented x 3 today.   Head- normocephalic, atraumatic Eyes-  Sclera clear, conjunctiva pink Ears- hearing intact Oropharynx- clear Neck- supple, no JVP Lymph- no cervical lymphadenopathy Lungs- Clear to ausculation bilaterally, normal work of breathing Heart-  irregular rate and rhythm, no murmurs, rubs or gallops, PMI not laterally displaced GI- soft, NT, ND, + BS Extremities- no clubbing, cyanosis, or edema MS- no significant deformity or atrophy Skin- no rash or lesion Psych- euthymic mood, full affect Neuro- strength and sensation are intact  EKG-afib at 90 bpm, qrs int 108 ms, qtc 450 ms Epic records reviewed  - Left ventricle: The cavity size was normal. Wall thickness was   normal. Systolic function was severely reduced. The estimated   ejection fraction was in the range of 20% to 25%. Akinesis of the   basal-midanteroseptal myocardium. - Mitral valve: There was mild regurgitation. - Left atrium: The atrium was severely dilated.50 mm - Right atrium: The atrium was moderately dilated.   Assessment and Plan: 1. Afib/flutter ERAF after cardioversion and loading on sotalol Decrease metoprolol to  50 mg bid, pt thinks causing diarrhea Restart magnesium Will try to cardiovert again(scheduled 12/26) and if ERAF will have to change plan, ? tikosyn vrs amiodarone, Left atrium size of 50 mm may undermine long term ability to restore SR, will discuss with Dr. Rayann Heman STATES NO MISSED DOSED OF ELIQUIS(restarted 12/5) Cmet. Mag, CBC, dig level today  2. HTN Elevated today Increase of diuretic will help  3. Acute on chronic HF, EF 20-25% Fluid status is up 2 lbs Pt is noticing more shortness of breath Avoid salt Increase lasix to 60 mg daily Start ace when stable Work note given until after checked after  cardioversion    F/u one week in afib clinic  Alder C. Marynell Bies, [facility] [address] [place], [place] [address] [phone]

## 2016-11-13 NOTE — Patient Instructions (Signed)
Cardioversion scheduled for Tuesday, December 26th  - Arrive at the [facility] and go to admitting at 12:30pm  -Do not eat or drink anything after midnight the night prior to your procedure.  - Take all your medication with a sip of water prior to arrival.  - You will not be able to drive home after your procedure.   Your physician has recommended you make the following change in your medication:  1)Increase lasix to 1 and 1/2 tablets a day -- 60mg  total 2)Decrease metoprolol to 50mg  twice a day 3)Restart Magnesium once a day

## 2016-11-14 ENCOUNTER — Ambulatory Visit: Payer: Managed Care, Other (non HMO) | Admitting: Physician Assistant

## 2016-11-18 ENCOUNTER — Encounter (HOSPITAL_COMMUNITY)
Admission: RE | Disposition: A | Discharge status: Home / Self Care | Payer: Self-pay | Source: Ambulatory Visit | Attending: Internal Medicine

## 2016-11-18 ENCOUNTER — Telehealth: Payer: Self-pay | Admitting: Cardiology

## 2016-11-18 ENCOUNTER — Ambulatory Visit (HOSPITAL_COMMUNITY): Payer: Managed Care, Other (non HMO) | Admitting: Anesthesiology

## 2016-11-18 ENCOUNTER — Ambulatory Visit (HOSPITAL_BASED_OUTPATIENT_CLINIC_OR_DEPARTMENT_OTHER)
Admission: RE | Admit: 2016-11-18 | Discharge: 2016-11-18 | Disposition: A | Discharge status: Home / Self Care | Payer: Managed Care, Other (non HMO) | Source: Ambulatory Visit | Attending: Internal Medicine | Admitting: Internal Medicine

## 2016-11-18 ENCOUNTER — Encounter (HOSPITAL_COMMUNITY): Payer: Self-pay | Admitting: *Deleted

## 2016-11-18 DIAGNOSIS — Z6841 Body Mass Index (BMI) 40.0 and over, adult: Secondary | ICD-10-CM

## 2016-11-18 DIAGNOSIS — Z87891 Personal history of nicotine dependence: Secondary | ICD-10-CM

## 2016-11-18 DIAGNOSIS — I509 Heart failure, unspecified: Secondary | ICD-10-CM

## 2016-11-18 DIAGNOSIS — I110 Hypertensive heart disease with heart failure: Secondary | ICD-10-CM | POA: Insufficient documentation

## 2016-11-18 DIAGNOSIS — I4891 Unspecified atrial fibrillation: Secondary | ICD-10-CM | POA: Insufficient documentation

## 2016-11-18 DIAGNOSIS — I481 Persistent atrial fibrillation: Secondary | ICD-10-CM

## 2016-11-18 DIAGNOSIS — I6340 Cerebral infarction due to embolism of unspecified cerebral artery: Secondary | ICD-10-CM | POA: Diagnosis not present

## 2016-11-18 DIAGNOSIS — R001 Bradycardia, unspecified: Secondary | ICD-10-CM

## 2016-11-18 DIAGNOSIS — Z888 Allergy status to other drugs, medicaments and biological substances status: Secondary | ICD-10-CM | POA: Insufficient documentation

## 2016-11-18 DIAGNOSIS — I2510 Atherosclerotic heart disease of native coronary artery without angina pectoris: Secondary | ICD-10-CM | POA: Insufficient documentation

## 2016-11-18 DIAGNOSIS — R4781 Slurred speech: Secondary | ICD-10-CM | POA: Diagnosis not present

## 2016-11-18 HISTORY — PX: CARDIOVERSION: SHX1299

## 2016-11-18 SURGERY — CARDIOVERSION
Anesthesia: Monitor Anesthesia Care

## 2016-11-18 MED ORDER — PROPOFOL 10 MG/ML IV BOLUS
INTRAVENOUS | Status: DC | PRN
  Administered 2016-11-18: 100 mg via INTRAVENOUS

## 2016-11-18 MED ORDER — SODIUM CHLORIDE 0.9 % IV SOLN
INTRAVENOUS | Status: DC
  Administered 2016-11-18: 13:00:00 via INTRAVENOUS

## 2016-11-18 MED ORDER — LIDOCAINE HCL (CARDIAC) 20 MG/ML IV SOLN
INTRAVENOUS | Status: DC | PRN
  Administered 2016-11-18: 100 mg via INTRAVENOUS

## 2016-11-18 NOTE — Transfer of Care (Signed)
Immediate Anesthesia Transfer of Care Note  Patient: Patricia Wagner  Procedure(s) Performed: Procedure(s): CARDIOVERSION (N/A)  Patient Location: Endoscopy Unit  Anesthesia Type:General  Level of Consciousness: awake, alert , oriented and patient cooperative  Airway & Oxygen Therapy: Patient Spontanous Breathing and Patient connected to nasal cannula oxygen  Post-op Assessment: Report given to RN and Post -op Vital signs reviewed and stable  Post vital signs: Reviewed  Last Vitals:  Vitals:   11/18/16 1247 11/18/16 1415  BP: (!) 163/93 140/75  Pulse: 87 (!) 47  Temp: 36.8 C     Last Pain:  Vitals:   11/18/16 1247  TempSrc: Oral         Complications: No apparent anesthesia complications

## 2016-11-18 NOTE — Anesthesia Preprocedure Evaluation (Addendum)
Anesthesia Evaluation  Patient identified by MRN, date of birth, ID band Patient awake    Reviewed: Allergy & Precautions, NPO status , Patient's Chart, lab work & pertinent test results, reviewed documented beta blocker date and time   History of Anesthesia Complications Negative for: history of anesthetic complications  Airway Mallampati: I  TM Distance: >3 FB Neck ROM: Full    Dental  (+) Teeth Intact, Dental Advisory Given   Pulmonary former smoker,  1 ppd smoker x "many years" Quit 10/20/16    Pulmonary exam normal breath sounds clear to auscultation       Cardiovascular hypertension, Pt. on medications and Pt. on home beta blockers + CAD and +CHF  Normal cardiovascular exam+ dysrhythmias Atrial Fibrillation      Neuro/Psych negative neurological ROS  negative psych ROS   GI/Hepatic negative GI ROS, Neg liver ROS,   Endo/Other  Morbid obesity  Renal/GU Renal disease     Musculoskeletal   Abdominal   Peds negative pediatric ROS (+)  Hematology   Anesthesia Other Findings   Reproductive/Obstetrics                            Anesthesia Physical Anesthesia Plan  ASA: III  Anesthesia Plan: General   Post-op Pain Management:    Induction: Intravenous  Airway Management Planned: Mask  Additional Equipment:   Intra-op Plan:   Post-operative Plan:   Informed Consent: I have reviewed the patients History and Physical, chart, labs and discussed the procedure including the risks, benefits and alternatives for the proposed anesthesia with the patient or authorized representative who has indicated his/her understanding and acceptance.     Plan Discussed with: CRNA and Surgeon  Anesthesia Plan Comments:         Anesthesia Quick Evaluation

## 2016-11-18 NOTE — Telephone Encounter (Signed)
12.26.17 Patient brought in [place] forms for Dr [name] to review, complete and sign.  Received Signed AUTH/FMLA Forms Gailen Shelter) and Money Order for [employer] to process.  Sent to [employer] @ [employer] for processing. lp

## 2016-11-18 NOTE — Discharge Instructions (Signed)
Electrical Cardioversion, Care After °This sheet gives you information about how to care for yourself after your procedure. Your health care provider may also give you more specific instructions. If you have problems or questions, contact your health care provider. °What can I expect after the procedure? °After the procedure, it is common to have: °· Some redness on the skin where the shocks were given. °Follow these instructions at home: °· Do not drive for 24 hours if you were given a medicine to help you relax (sedative). °· Take over-the-counter and prescription medicines only as told by your health care provider. °· Ask your health care provider how to check your pulse. Check it often. °· Rest for 48 hours after the procedure or as told by your health care provider. °· Avoid or limit your caffeine use as told by your health care provider. °Contact a health care provider if: °· You feel like your heart is beating too quickly or your pulse is not regular. °· You have a serious muscle cramp that does not go away. °Get help right away if: °· You have discomfort in your chest. °· You are dizzy or you feel faint. °· You have trouble breathing or you are short of breath. °· Your speech is slurred. °· You have trouble moving an arm or leg on one side of your body. °· Your fingers or toes turn cold or blue. °This information is not intended to replace advice given to you by your health care provider. Make sure you discuss any questions you have with your health care provider. °Document Released: 08/31/2013 Document Revised: 06/13/2016 Document Reviewed: 05/16/2016 °Elsevier Interactive Patient Education © 2017 Elsevier Inc. ° °

## 2016-11-18 NOTE — Anesthesia Procedure Notes (Signed)
Date/Time: 11/18/2016 1:58 PM Performed by: Jenne Campus Pre-anesthesia Checklist: Patient identified, Emergency Drugs available, Suction available, Patient being monitored and Timeout performed Patient Re-evaluated:Patient Re-evaluated prior to inductionOxygen Delivery Method: Ambu bag Preoxygenation: Pre-oxygenation with 100% oxygen Intubation Type: IV induction

## 2016-11-18 NOTE — H&P (Signed)
    INTERVAL PROCEDURE H&P  History and Physical Interval Note:  11/18/2016 1:59 PM  Patricia Wagner has presented today for their planned procedure. The various methods of treatment have been discussed with the patient and family. After consideration of risks, benefits and other options for treatment, the patient has consented to the procedure.  The patients' outpatient history has been reviewed, patient examined, and no change in status from most recent office note within the past 30 days. I have reviewed the patients' chart and labs and will proceed as planned. Questions were answered to the patient's satisfaction.   [name], MD, [facility] Attending Cardiologist [place] 11/18/2016, 1:59 PM

## 2016-11-18 NOTE — CV Procedure (Signed)
    CARDIOVERSION NOTE  Procedure: Electrical Cardioversion Indications:  Atrial Fibrillation  Procedure Details:  Consent: Risks of procedure as well as the alternatives and risks of each were explained to the (patient/caregiver).  Consent for procedure obtained.  Time Out: Verified patient identification, verified procedure, site/side was marked, verified correct patient position, special equipment/implants available, medications/allergies/relevent history reviewed, required imaging and test results available.  Performed  Patient placed on cardiac monitor, pulse oximetry, supplemental oxygen as necessary.  Sedation given: Propofol per anesthesia Pacer pads placed anterior and posterior chest.  Cardioverted 1 time(s).  Cardioverted at 150J biphasic.  Impression: Findings: Post procedure EKG shows: Sinus bradycardia Complications: None Patient did tolerate procedure well.  Plan: 1. Successful DCCV to sinus bradycardia with a single 150J biphasic shock.  Time Spent Directly with the Patient:  30 minutes   [name], MD, [facility] Attending Cardiologist [place] 11/18/2016, 2:15 PM

## 2016-11-18 NOTE — Anesthesia Postprocedure Evaluation (Signed)
Anesthesia Post Note  Patient: Patricia Wagner  Procedure(s) Performed: Procedure(s) (LRB): CARDIOVERSION (N/A)  Patient location during evaluation: PACU Anesthesia Type: General Level of consciousness: awake and alert Pain management: pain level controlled Vital Signs Assessment: post-procedure vital signs reviewed and stable Respiratory status: spontaneous breathing, nonlabored ventilation, respiratory function stable and patient connected to nasal cannula oxygen Cardiovascular status: blood pressure returned to baseline and stable Postop Assessment: no signs of nausea or vomiting Anesthetic complications: no       Last Vitals:  Vitals:   11/18/16 1247 11/18/16 1415  BP: (!) 163/93 140/75  Pulse: 87 (!) 47  Temp: 36.8 C     Last Pain:  Vitals:   11/18/16 1247  TempSrc: Oral                 Laycie Schriner DAVID

## 2016-11-19 ENCOUNTER — Encounter (HOSPITAL_COMMUNITY): Payer: Self-pay | Admitting: Internal Medicine

## 2016-11-20 ENCOUNTER — Emergency Department (HOSPITAL_COMMUNITY): Payer: Managed Care, Other (non HMO)

## 2016-11-20 ENCOUNTER — Inpatient Hospital Stay (HOSPITAL_COMMUNITY): Payer: Managed Care, Other (non HMO)

## 2016-11-20 ENCOUNTER — Inpatient Hospital Stay (HOSPITAL_COMMUNITY)
Admission: EM | Admit: 2016-11-20 | Discharge: 2016-11-25 | DRG: 064 | Disposition: A | Discharge status: Home Health | Payer: Managed Care, Other (non HMO) | Attending: Internal Medicine | Admitting: Internal Medicine

## 2016-11-20 ENCOUNTER — Encounter (HOSPITAL_COMMUNITY): Payer: Self-pay | Admitting: Emergency Medicine

## 2016-11-20 ENCOUNTER — Ambulatory Visit (HOSPITAL_COMMUNITY): Payer: Managed Care, Other (non HMO) | Admitting: Nurse Practitioner

## 2016-11-20 ENCOUNTER — Observation Stay (HOSPITAL_COMMUNITY): Payer: Managed Care, Other (non HMO)

## 2016-11-20 DIAGNOSIS — R4781 Slurred speech: Secondary | ICD-10-CM

## 2016-11-20 DIAGNOSIS — E785 Hyperlipidemia, unspecified: Secondary | ICD-10-CM | POA: Diagnosis present

## 2016-11-20 DIAGNOSIS — I639 Cerebral infarction, unspecified: Secondary | ICD-10-CM | POA: Diagnosis not present

## 2016-11-20 DIAGNOSIS — I5022 Chronic systolic (congestive) heart failure: Secondary | ICD-10-CM

## 2016-11-20 DIAGNOSIS — I63442 Cerebral infarction due to embolism of left cerebellar artery: Secondary | ICD-10-CM

## 2016-11-20 DIAGNOSIS — I6340 Cerebral infarction due to embolism of unspecified cerebral artery: Principal | ICD-10-CM | POA: Diagnosis present

## 2016-11-20 DIAGNOSIS — Z6841 Body Mass Index (BMI) 40.0 and over, adult: Secondary | ICD-10-CM | POA: Diagnosis not present

## 2016-11-20 DIAGNOSIS — R51 Headache: Secondary | ICD-10-CM

## 2016-11-20 DIAGNOSIS — K5900 Constipation, unspecified: Secondary | ICD-10-CM | POA: Diagnosis present

## 2016-11-20 DIAGNOSIS — R29810 Facial weakness: Secondary | ICD-10-CM | POA: Diagnosis present

## 2016-11-20 DIAGNOSIS — I480 Paroxysmal atrial fibrillation: Secondary | ICD-10-CM | POA: Diagnosis not present

## 2016-11-20 DIAGNOSIS — I5023 Acute on chronic systolic (congestive) heart failure: Secondary | ICD-10-CM | POA: Diagnosis present

## 2016-11-20 DIAGNOSIS — N189 Chronic kidney disease, unspecified: Secondary | ICD-10-CM | POA: Diagnosis present

## 2016-11-20 DIAGNOSIS — Z79899 Other long term (current) drug therapy: Secondary | ICD-10-CM

## 2016-11-20 DIAGNOSIS — I429 Cardiomyopathy, unspecified: Secondary | ICD-10-CM | POA: Diagnosis present

## 2016-11-20 DIAGNOSIS — I5042 Chronic combined systolic (congestive) and diastolic (congestive) heart failure: Secondary | ICD-10-CM

## 2016-11-20 DIAGNOSIS — I2510 Atherosclerotic heart disease of native coronary artery without angina pectoris: Secondary | ICD-10-CM | POA: Diagnosis present

## 2016-11-20 DIAGNOSIS — W19XXXA Unspecified fall, initial encounter: Secondary | ICD-10-CM | POA: Diagnosis present

## 2016-11-20 DIAGNOSIS — I6350 Cerebral infarction due to unspecified occlusion or stenosis of unspecified cerebral artery: Secondary | ICD-10-CM | POA: Diagnosis not present

## 2016-11-20 DIAGNOSIS — E876 Hypokalemia: Secondary | ICD-10-CM | POA: Diagnosis not present

## 2016-11-20 DIAGNOSIS — J9601 Acute respiratory failure with hypoxia: Secondary | ICD-10-CM | POA: Diagnosis present

## 2016-11-20 DIAGNOSIS — R0602 Shortness of breath: Secondary | ICD-10-CM

## 2016-11-20 DIAGNOSIS — I10 Essential (primary) hypertension: Secondary | ICD-10-CM | POA: Diagnosis not present

## 2016-11-20 DIAGNOSIS — R001 Bradycardia, unspecified: Secondary | ICD-10-CM | POA: Diagnosis present

## 2016-11-20 DIAGNOSIS — Z8249 Family history of ischemic heart disease and other diseases of the circulatory system: Secondary | ICD-10-CM

## 2016-11-20 DIAGNOSIS — R471 Dysarthria and anarthria: Secondary | ICD-10-CM | POA: Diagnosis present

## 2016-11-20 DIAGNOSIS — N183 Chronic kidney disease, stage 3 (moderate): Secondary | ICD-10-CM | POA: Diagnosis present

## 2016-11-20 DIAGNOSIS — I130 Hypertensive heart and chronic kidney disease with heart failure and stage 1 through stage 4 chronic kidney disease, or unspecified chronic kidney disease: Secondary | ICD-10-CM | POA: Diagnosis present

## 2016-11-20 DIAGNOSIS — Z7982 Long term (current) use of aspirin: Secondary | ICD-10-CM | POA: Diagnosis not present

## 2016-11-20 DIAGNOSIS — Z888 Allergy status to other drugs, medicaments and biological substances status: Secondary | ICD-10-CM

## 2016-11-20 DIAGNOSIS — Z7901 Long term (current) use of anticoagulants: Secondary | ICD-10-CM

## 2016-11-20 DIAGNOSIS — I481 Persistent atrial fibrillation: Secondary | ICD-10-CM | POA: Diagnosis present

## 2016-11-20 DIAGNOSIS — Z8673 Personal history of transient ischemic attack (TIA), and cerebral infarction without residual deficits: Secondary | ICD-10-CM | POA: Diagnosis not present

## 2016-11-20 DIAGNOSIS — R0600 Dyspnea, unspecified: Secondary | ICD-10-CM

## 2016-11-20 DIAGNOSIS — Z87891 Personal history of nicotine dependence: Secondary | ICD-10-CM | POA: Diagnosis not present

## 2016-11-20 DIAGNOSIS — N179 Acute kidney failure, unspecified: Secondary | ICD-10-CM | POA: Diagnosis present

## 2016-11-20 DIAGNOSIS — R519 Headache, unspecified: Secondary | ICD-10-CM

## 2016-11-20 DIAGNOSIS — I5043 Acute on chronic combined systolic (congestive) and diastolic (congestive) heart failure: Secondary | ICD-10-CM

## 2016-11-20 HISTORY — DX: Bradycardia, unspecified: R00.1

## 2016-11-20 LAB — CBC
HCT: 43.6 % (ref 36.0–46.0)
Hemoglobin: 13.6 g/dL (ref 12.0–15.0)
MCH: 29.1 pg (ref 26.0–34.0)
MCHC: 31.2 g/dL (ref 30.0–36.0)
MCV: 93.2 fL (ref 78.0–100.0)
PLATELETS: 167 10*3/uL (ref 150–400)
RBC: 4.68 MIL/uL (ref 3.87–5.11)
RDW: 13.7 % (ref 11.5–15.5)
WBC: 9.5 10*3/uL (ref 4.0–10.5)

## 2016-11-20 LAB — TROPONIN I
TROPONIN I: 0.03 ng/mL — AB (ref <0.03)
Troponin I: <0.03 ng/mL (ref <0.03)
Troponin I: 0.03 ng/mL (ref <0.03)

## 2016-11-20 LAB — COMPREHENSIVE METABOLIC PANEL
ALBUMIN: 3.1 g/dL — AB (ref 3.5–5.0)
ALT: 51 U/L (ref 14–54)
AST: 65 U/L — AB (ref 15–41)
Alkaline Phosphatase: 60 U/L (ref 38–126)
Anion gap: 6 (ref 5–15)
BUN: 22 mg/dL — AB (ref 6–20)
CHLORIDE: 101 mmol/L (ref 101–111)
CO2: 32 mmol/L (ref 22–32)
Calcium: 8.8 mg/dL — ABNORMAL LOW (ref 8.9–10.3)
Creatinine, Ser: 1.14 mg/dL — ABNORMAL HIGH (ref 0.44–1.00)
GFR calc Af Amer: 58 mL/min — ABNORMAL LOW (ref >60)
GFR, EST NON AFRICAN AMERICAN: 50 mL/min — AB (ref >60)
Glucose, Bld: 177 mg/dL — ABNORMAL HIGH (ref 65–99)
POTASSIUM: 3.6 mmol/L (ref 3.5–5.1)
Sodium: 139 mmol/L (ref 135–145)
Total Bilirubin: 0.6 mg/dL (ref 0.3–1.2)
Total Protein: 6.6 g/dL (ref 6.5–8.1)

## 2016-11-20 LAB — BRAIN NATRIURETIC PEPTIDE: B NATRIURETIC PEPTIDE 5: 1555.6 pg/mL — AB (ref 0.0–100.0)

## 2016-11-20 LAB — CBG MONITORING, ED: GLUCOSE-CAPILLARY: 141 mg/dL — AB (ref 65–99)

## 2016-11-20 LAB — DIGOXIN LEVEL: Digoxin Level: 0.6 ng/mL — ABNORMAL LOW (ref 0.8–2.0)

## 2016-11-20 LAB — PROTIME-INR
INR: 1.17
PROTHROMBIN TIME: 14.9 s (ref 11.4–15.2)

## 2016-11-20 LAB — ETHANOL

## 2016-11-20 MED ORDER — FUROSEMIDE 40 MG PO TABS
60.0000 mg | ORAL_TABLET | Freq: Every day | ORAL | Status: DC
  Administered 2016-11-21: 60 mg via ORAL
  Filled 2016-11-20 (×2): qty 1

## 2016-11-20 MED ORDER — ASPIRIN 325 MG PO TABS
325.0000 mg | ORAL_TABLET | Freq: Every day | ORAL | Status: DC
  Administered 2016-11-21 – 2016-11-22 (×2): 325 mg via ORAL
  Filled 2016-11-20 (×3): qty 1

## 2016-11-20 MED ORDER — ACETAMINOPHEN 650 MG RE SUPP: 650.0000 mg | Freq: Four times a day (QID) | RECTAL | Status: DC | PRN

## 2016-11-20 MED ORDER — ACETAMINOPHEN 325 MG PO TABS
650.0000 mg | ORAL_TABLET | Freq: Four times a day (QID) | ORAL | Status: DC | PRN
  Administered 2016-11-21 – 2016-11-24 (×2): 650 mg via ORAL
  Filled 2016-11-20 (×2): qty 2

## 2016-11-20 MED ORDER — ONDANSETRON HCL 4 MG PO TABS
4.0000 mg | ORAL_TABLET | Freq: Four times a day (QID) | ORAL | Status: DC | PRN
  Administered 2016-11-24: 4 mg via ORAL
  Filled 2016-11-20: qty 1

## 2016-11-20 MED ORDER — ONDANSETRON HCL 4 MG/2ML IJ SOLN
4.0000 mg | Freq: Four times a day (QID) | INTRAMUSCULAR | Status: DC | PRN
Start: 2016-11-20 — End: 2016-11-25

## 2016-11-20 MED ORDER — APIXABAN 5 MG PO TABS
5.0000 mg | ORAL_TABLET | Freq: Two times a day (BID) | ORAL | Status: DC
  Administered 2016-11-20 – 2016-11-22 (×4): 5 mg via ORAL
  Filled 2016-11-20 (×5): qty 1

## 2016-11-20 MED ORDER — ASPIRIN 300 MG RE SUPP: 300.0000 mg | Freq: Every day | RECTAL | Status: DC

## 2016-11-20 MED ORDER — HYDRALAZINE HCL 20 MG/ML IJ SOLN: 5.0000 mg | INTRAMUSCULAR | Status: DC | PRN

## 2016-11-20 MED ORDER — TRAMADOL HCL 50 MG PO TABS: 50.0000 mg | ORAL_TABLET | Freq: Four times a day (QID) | ORAL | Status: DC | PRN

## 2016-11-20 MED ORDER — IOPAMIDOL (ISOVUE-370) INJECTION 76%
INTRAVENOUS | Status: AC
  Administered 2016-11-20: 50 mL
  Filled 2016-11-20: qty 50

## 2016-11-20 MED ORDER — SODIUM CHLORIDE 0.9% FLUSH
3.0000 mL | Freq: Two times a day (BID) | INTRAVENOUS | Status: DC
  Administered 2016-11-20 – 2016-11-24 (×8): 3 mL via INTRAVENOUS

## 2016-11-20 MED ORDER — ROSUVASTATIN CALCIUM 5 MG PO TABS
10.0000 mg | ORAL_TABLET | Freq: Every day | ORAL | Status: DC
  Administered 2016-11-20 – 2016-11-24 (×5): 10 mg via ORAL
  Filled 2016-11-20 (×7): qty 2

## 2016-11-20 MED ORDER — STROKE: EARLY STAGES OF RECOVERY BOOK
Freq: Once | Status: DC
  Filled 2016-11-20 (×2): qty 1

## 2016-11-20 MED ORDER — MAGNESIUM OXIDE 400 (241.3 MG) MG PO TABS
400.0000 mg | ORAL_TABLET | Freq: Every day | ORAL | Status: DC
  Administered 2016-11-21 – 2016-11-25 (×5): 400 mg via ORAL
  Filled 2016-11-20 (×7): qty 1

## 2016-11-20 NOTE — ED Notes (Signed)
Pt in MRI.

## 2016-11-20 NOTE — H&P (Signed)
History and Physical    Patricia Wagner HTD:[id number] DOB: Aug 10, 1951 DOA: 11/20/2016  PCP: Cathlean Cower, MD Patient coming from: Home  Chief Complaint: Dizziness and HA.   HPI: Patricia Wagner is a 65 y.o. female with medical history significant of CAD, systolic CHF, diverticulosis, HLD, morbid obesity. Patient reports awakening at approximately 4:00 in the morning with left eye pain and left-sided headache. Associated with dizziness. Single episode of nausea and vomiting. Dizziness is constant. Denies any blurred vision or vision loss. Overall symptoms are slowly improving. Patient reports doing very dry mouth and having difficulty ambulating to the bathroom. After arriving in the emergency room patient complaints of some intermittent shortness of breath but denies any coughing, wheezing, fevers. She denies any recent palpitations since last cardioversion. Patient endorses compliance with home medications. Patient unsure of what all of her medicines are formed but states that she takes everything she's been prescribed. Patient also denies any recent dysuria, frequency, back pain, lower extremity swelling above baseline.  ED Course:  Patient found to be bradycardic. EKG showing sinus rhythm. Called for admission. MRI ordered by EDP to evaluate for dizziness and possible stroke.  Review of Systems: As per HPI otherwise 10 point review of systems negative.   Ambulatory Status: no restrictions prior to admission  Past Medical History:  Diagnosis Date  . Arthritis   . CAD in native artery, s/p cardiac cath with non obstructive CAD 10/24/2016  . CHF (congestive heart failure) ([place])   . DIVERTICULITIS, HX OF 07/25/2007  . DIVERTICULOSIS, COLON 07/22/2007  . Dyspnea   . Edema, peripheral    a. chronic BLE edema, R>L. Prior trauma from dog attack and accident.  Marland Kitchen HYPERLIPIDEMIA 02/03/2008  . Hypersomnia    declines w/u  . Hypertension   . MENOPAUSAL DISORDER 01/09/2011  . Morbid obesity ([place]) 07/22/2007   . NICM (nonischemic cardiomyopathy) ([place]) 10/24/2016  . Raynaud's syndrome 07/22/2007  . THYROID NODULE, RIGHT 01/04/2010  . VITAMIN D DEFICIENCY 01/09/2011   Qualifier: Diagnosis of  By: [name], [name]     Past Surgical History:  Procedure Laterality Date  . CARDIAC CATHETERIZATION N/A 10/23/2016   Procedure: Left Heart Cath and Coronary Angiography;  Surgeon: [name], MD;  Location: [place] CV LAB;  Service: Cardiovascular;  Laterality: N/A;  . CARDIOVERSION N/A 10/31/2016   Procedure: CARDIOVERSION;  Surgeon: [name], MD;  Location: [facility] ENDOSCOPY;  Service: Cardiovascular;  Laterality: N/A;  . CARDIOVERSION N/A 11/03/2016   Procedure: CARDIOVERSION;  Surgeon: [name], MD;  Location: [facility] ENDOSCOPY;  Service: Cardiovascular;  Laterality: N/A;  . CARDIOVERSION N/A 11/18/2016   Procedure: CARDIOVERSION;  Surgeon: [name], MD;  Location: [facility] ENDOSCOPY;  Service: Cardiovascular;  Laterality: N/A;  . CHOLECYSTECTOMY    . COLONOSCOPY    . PARTIAL HYSTERECTOMY     1 OVARY LEFT  . POLYPECTOMY    . TEE WITHOUT CARDIOVERSION N/A 10/31/2016   Procedure: TRANSESOPHAGEAL ECHOCARDIOGRAM (TEE);  Surgeon: [name], MD;  Location: [facility] ENDOSCOPY;  Service: Cardiovascular;  Laterality: N/A;    Social History   Social History  . Marital status: Single    Spouse name: N/A  . Number of children: 1  . Years of education: N/A   Occupational History  . Printing     Social History Main Topics  . Smoking status: Former Smoker    Packs/day: 0.50    Types: Cigarettes    Quit date: 10/23/2016  . Smokeless tobacco: Never Used  Comment: Smoked for 50 years  . Alcohol use 2.4 oz/week    4 Cans of beer per week     Comment: Intermittent  . Drug use: No  . Sexual activity: Not on file   Other Topics Concern  . Not on file   Social History Narrative  . No narrative on file    Allergies  Allergen Reactions  . Ace Inhibitors Palpitations    Family History    Problem Relation Age of Onset  . Asthma Mother   . Diabetes Father   . Heart disease Father     Died of presumed heart attack - 5s  . Lung disease Sister   . Heart disease Sister     Twin sister has heart issue, unclear what kind  . Thyroid disease Neg Hx     Prior to Admission medications   Medication Sig Start Date End Date Taking? Authorizing Provider  acetaminophen (TYLENOL) 325 MG tablet Take 2 tablets (650 mg total) by mouth every 4 (four) hours as needed for headache or mild pain. Patient taking differently: Take 325 mg by mouth every 4 (four) hours as needed for headache or mild pain.  10/24/16   Isaiah Serge, NP  apixaban (ELIQUIS) 5 MG TABS tablet Take 1 tablet (5 mg total) by mouth 2 (two) times daily. 10/24/16   Isaiah Serge, NP  digoxin (LANOXIN) 0.125 MG tablet Take 1 tablet (0.125 mg total) by mouth daily. 10/24/16   Isaiah Serge, NP  furosemide (LASIX) 40 MG tablet Take 1.5 tablets (60 mg total) by mouth daily. 11/13/16   Sherran Needs, NP  losartan (COZAAR) 25 MG tablet Take 1 tablet (25 mg total) by mouth daily. 11/05/16   Amber Sena Slate, NP  Magnesium 250 MG TABS Take 1 tablet (250 mg total) by mouth daily. 11/13/16   Sherran Needs, NP  metoprolol (LOPRESSOR) 50 MG tablet Take 1 tablet (50 mg total) by mouth 2 (two) times daily. 11/13/16   Sherran Needs, NP  potassium chloride SA (K-DUR,KLOR-CON) 20 MEQ tablet Take 1 tablet (20 mEq total) by mouth daily. 11/05/16   Amber Sena Slate, NP  rosuvastatin (CRESTOR) 10 MG tablet Take 1 tablet (10 mg total) by mouth daily. 10/26/15   Biagio Borg, MD  sotalol (BETAPACE) 120 MG tablet Take 1 tablet (120 mg total) by mouth every 12 (twelve) hours. 11/04/16   Patsey Berthold, NP    Physical Exam: Vitals:   11/20/16 0845 11/20/16 0915 11/20/16 0945 11/20/16 1015  BP: 171/85 167/80 171/96 174/82  Pulse: (!) 35 (!) 40  (!) 35  Resp: 14 15 17 19   SpO2: 100% 100%  91%  Weight:      Height:         General:  Appears  calm and comfortable Eyes:  PERRL, EOMI, normal lids, iris ENT:  grossly normal hearing, lips & tongue, mmm Neck:  no LAD, masses or thyromegaly Cardiovascular:  RRR, no m/r/g. Trace LE edema.  Respiratory:  CTA bilaterally, no w/r/r. Normal respiratory effort. Abdomen:  soft, ntnd, NABS Skin:  no rash or induration seen on limited exam Musculoskeletal:  grossly normal tone BUE/BLE, good ROM, no bony abnormality Psychiatric:  grossly normal mood and affect, speech fluent and appropriate, AOx3 Neurologic:  CN 2-12 grossly intact, left upper extremity dysmetria, sensation intact  Labs on Admission: I have personally reviewed following labs and imaging studies  CBC:  Recent Labs Lab 11/13/16 1612 11/20/16 0527  WBC 7.0 9.5  HGB 14.2 13.6  HCT 45.0 43.6  MCV 93.2 93.2  PLT 201 854   Basic Metabolic Panel:  Recent Labs Lab 11/13/16 1612 11/13/16 1613 11/20/16 0527  NA  --  143 139  K  --  4.1 3.6  CL  --  102 101  CO2  --  35* 32  GLUCOSE  --  87 177*  BUN  --  21* 22*  CREATININE  --  1.21* 1.14*  CALCIUM  --  9.4 8.8*  MG 1.9  --   --    GFR: Estimated Creatinine Clearance: 62.3 mL/min (by C-G formula based on SCr of 1.14 mg/dL (H)). Liver Function Tests:  Recent Labs Lab 11/13/16 1613 11/20/16 0527  AST 114* 65*  ALT 76* 51  ALKPHOS 54 60  BILITOT 0.7 0.6  PROT 6.8 6.6  ALBUMIN 3.2* 3.1*   No results for input(s): LIPASE, AMYLASE in the last 168 hours. No results for input(s): AMMONIA in the last 168 hours. Coagulation Profile: No results for input(s): INR, PROTIME in the last 168 hours. Cardiac Enzymes:  Recent Labs Lab 11/20/16 0527  TROPONINI <0.03   BNP (last 3 results) No results for input(s): PROBNP in the last 8760 hours. HbA1C: No results for input(s): HGBA1C in the last 72 hours. CBG:  Recent Labs Lab 11/20/16 0524  GLUCAP 141*   Lipid Profile: No results for input(s): CHOL, HDL, LDLCALC, TRIG, CHOLHDL, LDLDIRECT in the last 72  hours. Thyroid Function Tests: No results for input(s): TSH, T4TOTAL, FREET4, T3FREE, THYROIDAB in the last 72 hours. Anemia Panel: No results for input(s): VITAMINB12, FOLATE, FERRITIN, TIBC, IRON, RETICCTPCT in the last 72 hours. Urine analysis:    Component Value Date/Time   COLORURINE YELLOW 10/25/2015 [place] 10/25/2015 1658   LABSPEC 1.015 10/25/2015 1658   PHURINE 6.5 10/25/2015 1658   GLUCOSEU NEGATIVE 10/25/2015 1658   HGBUR NEGATIVE 10/25/2015 1658   BILIRUBINUR NEGATIVE 10/25/2015 1658   KETONESUR NEGATIVE 10/25/2015 1658   UROBILINOGEN 0.2 10/25/2015 1658   NITRITE POSITIVE (A) 10/25/2015 1658   LEUKOCYTESUR NEGATIVE 10/25/2015 1658    Creatinine Clearance: Estimated Creatinine Clearance: 62.3 mL/min (by C-G formula based on SCr of 1.14 mg/dL (H)).  Sepsis Labs: @LABRCNTIP (procalcitonin:4,lacticidven:4) )No results found for this or any previous visit (from the past 240 hour(s)).   Radiological Exams on Admission: Ct Head Wo Contrast  Result Date: 11/20/2016 CLINICAL DATA:  Headache behind left eye.  Dizziness. EXAM: CT HEAD WITHOUT CONTRAST TECHNIQUE: Contiguous axial images were obtained from the base of the skull through the vertex without intravenous contrast. COMPARISON:  None. FINDINGS: Brain: No mass lesion, intraparenchymal hemorrhage or extra-axial collection. No evidence of acute cortical infarct. Brain parenchyma and CSF-containing spaces are normal for age. Vascular: Atherosclerotic calcification of the vertebral and internal carotid arteries at the skullbase. Skull: Normal visualized skull base, calvarium and extracranial soft tissues. Sinuses/Orbits: No sinus fluid levels or advanced mucosal thickening. No mastoid effusion. Normal orbits. IMPRESSION: No acute intracranial abnormality. Electronically Signed   By: Ulyses Jarred M.D.   On: 11/20/2016 06:21   Mr Brain Wo Contrast  Result Date: 11/20/2016 CLINICAL DATA:  New onset  lightheadedness and dizziness. Nausea, vomiting, and diarrhea. EXAM: MRI HEAD WITHOUT CONTRAST TECHNIQUE: Multiplanar, multiecho pulse sequences of the brain and surrounding structures were obtained without intravenous contrast. COMPARISON:  CT head without contrast 11/20/2016 FINDINGS: Brain: An acute/subacute nonhemorrhagic infarct is evident within the left superior cerebellum. No acute supratentorial  infarct is present. Subtle T2 changes are associated with the infarct suggesting a more acute timeframe. A remote cortical infarct is present in the right occipital lobe medially. No other focal infarct is present. Mild periventricular T2 changes are within normal limits for age. The ventricles are normal size. No significant extra-axial fluid collection is present. Vascular: Flow is present in the major intracranial arteries. 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 The skullbase is otherwise within normal limits. The sella turcica is enlarged. No mass lesion is present. Midline sagittal structures are otherwise unremarkable. Degenerative changes are noted in the upper cervical spine with moderate cervical stenosis suggested at C4-5. Sinuses/Orbits: The paranasal sinuses and the mastoid air cells are clear. The globes and orbits are within normal limits. IMPRESSION: 1. Acute nonhemorrhagic left superior cerebellar infarct. 2. Normal flow is present within the major arteries of the circle of Willis. 3. Remote right occipital lobe infarct. Electronically Signed   By: San Morelle M.D.   On: 11/20/2016 08:41    EKG: Independently reviewed. SInus, brady. No acs.   Assessment/Plan Active Problems:   HLD (hyperlipidemia)   Essential hypertension   CKD (chronic kidney disease), stage III   Paroxysmal atrial fibrillation (HCC)   Bradycardia   Stroke (HCC)   Chronic systolic congestive heart failure (HCC)   Stroke: noted on MRI - acute L sup cerebellar remote R  occipital infarct. Dysmetria of LUE and L eye pain.  - Tele - CTA head and neck - Echo - Pt/OT - F/u Neuro recs - ASA, Crestor - A1c, Lipid - Permissive Hypertension  HTN: hold home BP meds for permissive HTN - hold losartan, metop, sotalol - Hydralazine PRN BP >210/110  PAF/Bradycardia: currently w/ symptomatic bradycardia w/ HR in the 30-40s. Cardioversion x3 in past 3 weeks for symptomatic Afib. Suspect pt was prescribed dual bblocker over past few weeks resulting in bradycardia.  - Hold metoprolol and Sotalol, Dig - DIg level - Continue Eliquis - Defer new regimen to Cardiology who has been consulted - Echo  Systolic CHF: Echo showing EF of 20-25%. No evidence of fluid overload at this time other than some subjective intermittent shortness of breath. - Repeat echo as above - Resume Lasix in a.m. - BNP, chest x-ray, ddimer   DVT prophylaxis: Eliuquis   Code Status: full  Family Communication: daughter  Disposition Plan: pending workup  Consults called: Neuro, cards  Admission status: inpt    MERRELL, DAVID J MD [facility]  If 7PM-7AM, please contact night-coverage [email or web address] Password [facility]  11/20/2016, 10:43 AM

## 2016-11-20 NOTE — ED Provider Notes (Signed)
Patricia Wagner DEPT Provider Note   CSN: [id number] Arrival date & time: 11/20/16  0506     History   Chief Complaint Chief Complaint  Patient presents with  . Nausea  . Dizziness  . Fall  . Headache    HPI Patricia Wagner is a 65 y.o. female.  HPI Patient is a history of paroxysmal atrial fibrillation, coronary artery disease, congestive heart failure who presents to the emergency department with complaints of lightheadedness and dizziness as well as nausea vomiting diarrhea tonight.  She reports left-sided headache and was found to be bradycardic by EMS with heart rates in the 40s.  Patient reports that she got up to head to the kitchen and fell secondary lightheadedness and dizziness.  She has a history of paroxysmal atrial fibrillation.  She is on anticoagulants.  She reports she's never had headaches like this before.  She denies weakness of her arms or legs.   Past Medical History:  Diagnosis Date  . Arthritis   . CAD in native artery, s/p cardiac cath with non obstructive CAD 10/24/2016  . CHF (congestive heart failure) ([place])   . DIVERTICULITIS, HX OF 07/25/2007  . DIVERTICULOSIS, COLON 07/22/2007  . Dyspnea   . Edema, peripheral    a. chronic BLE edema, R>L. Prior trauma from dog attack and accident.  Marland Kitchen HYPERLIPIDEMIA 02/03/2008  . Hypersomnia    declines w/u  . Hypertension   . MENOPAUSAL DISORDER 01/09/2011  . Morbid obesity ([place]) 07/22/2007  . NICM (nonischemic cardiomyopathy) ([place]) 10/24/2016  . Raynaud's syndrome 07/22/2007  . THYROID NODULE, RIGHT 01/04/2010  . VITAMIN D DEFICIENCY 01/09/2011   Qualifier: Diagnosis of  By: [name], [name]     Patient Active Problem List   Diagnosis Date Noted  . Persistent atrial fibrillation ([place]) 11/03/2016  . Paroxysmal atrial fibrillation ([facility])   . CHF (congestive heart failure) ([place]) 10/31/2016  . NICM (nonischemic cardiomyopathy) ([place]) 10/24/2016  . CAD in native artery, s/p cardiac cath with non obstructive CAD  10/24/2016  . Acute systolic congestive heart failure ([place])   . Atrial fibrillation with rapid ventricular response ([place]) 10/20/2016  . Tobacco abuse 10/20/2016  . Obesity 10/20/2016  . CKD (chronic kidney disease), stage III 10/20/2016  . Acute CHF (congestive heart failure) ([place]) 10/20/2016  . Atrial fibrillation with RVR ([place]) 10/20/2016  . Hematochezia 06/29/2013  . Degenerative arthritis of left knee 03/15/2012  . Preventative health care 03/12/2012  . Hypersomnia   . VITAMIN D DEFICIENCY 01/09/2011  . MENOPAUSAL DISORDER 01/09/2011  . THYROID NODULE, RIGHT 01/04/2010  . HYPERLIPIDEMIA 02/03/2008  . LEG CRAMPS, NOCTURNAL 02/03/2008  . LOW BACK PAIN 07/25/2007  . DIVERTICULITIS, HX OF 07/25/2007  . Morbid obesity ([place]) 07/22/2007  . Essential hypertension 07/22/2007  . Raynaud's syndrome 07/22/2007  . DIVERTICULOSIS, COLON 07/22/2007  . SYMPTOM, EDEMA 07/22/2007    Past Surgical History:  Procedure Laterality Date  . CARDIAC CATHETERIZATION N/A 10/23/2016   Procedure: Left Heart Cath and Coronary Angiography;  Surgeon: [name], MD;  Location: [facility];  Service: Cardiovascular;  Laterality: N/A;  . CARDIOVERSION N/A 10/31/2016   Procedure: CARDIOVERSION;  Surgeon: [name], MD;  Location: [facility] ENDOSCOPY;  Service: Cardiovascular;  Laterality: N/A;  . CARDIOVERSION N/A 11/03/2016   Procedure: CARDIOVERSION;  Surgeon: [name], MD;  Location: [facility];  Service: Cardiovascular;  Laterality: N/A;  . CARDIOVERSION N/A 11/18/2016   Procedure: CARDIOVERSION;  Surgeon: [name], MD;  Location: [facility];  Service:  Cardiovascular;  Laterality: N/A;  . CHOLECYSTECTOMY    . COLONOSCOPY    . PARTIAL HYSTERECTOMY     1 OVARY LEFT  . POLYPECTOMY    . TEE WITHOUT CARDIOVERSION N/A 10/31/2016   Procedure: TRANSESOPHAGEAL ECHOCARDIOGRAM (TEE);  Surgeon: [name], MD;  Location: [facility] ENDOSCOPY;  Service: Cardiovascular;  Laterality: N/A;     OB History    No data available       Home Medications    Prior to Admission medications   Medication Sig Start Date End Date Taking? Authorizing Provider  acetaminophen (TYLENOL) 325 MG tablet Take 2 tablets (650 mg total) by mouth every 4 (four) hours as needed for headache or mild pain. Patient taking differently: Take 325 mg by mouth every 4 (four) hours as needed for headache or mild pain.  10/24/16   Isaiah Serge, NP  apixaban (ELIQUIS) 5 MG TABS tablet Take 1 tablet (5 mg total) by mouth 2 (two) times daily. 10/24/16   Isaiah Serge, NP  digoxin (LANOXIN) 0.125 MG tablet Take 1 tablet (0.125 mg total) by mouth daily. 10/24/16   Isaiah Serge, NP  furosemide (LASIX) 40 MG tablet Take 1.5 tablets (60 mg total) by mouth daily. 11/13/16   Sherran Needs, NP  losartan (COZAAR) 25 MG tablet Take 1 tablet (25 mg total) by mouth daily. 11/05/16   Amber Sena Slate, NP  Magnesium 250 MG TABS Take 1 tablet (250 mg total) by mouth daily. 11/13/16   Sherran Needs, NP  metoprolol (LOPRESSOR) 50 MG tablet Take 1 tablet (50 mg total) by mouth 2 (two) times daily. 11/13/16   Sherran Needs, NP  potassium chloride SA (K-DUR,KLOR-CON) 20 MEQ tablet Take 1 tablet (20 mEq total) by mouth daily. 11/05/16   Amber Sena Slate, NP  rosuvastatin (CRESTOR) 10 MG tablet Take 1 tablet (10 mg total) by mouth daily. 10/26/15   Biagio Borg, MD  sotalol (BETAPACE) 120 MG tablet Take 1 tablet (120 mg total) by mouth every 12 (twelve) hours. 11/04/16   Patsey Berthold, NP    Family History Family History  Problem Relation Age of Onset  . Asthma Mother   . Diabetes Father   . Heart disease Father     Died of presumed heart attack - 81s  . Lung disease Sister   . Heart disease Sister     Twin sister has heart issue, unclear what kind  . Thyroid disease Neg Hx     Social History Social History  Substance Use Topics  . Smoking status: Former Smoker    Packs/day: 0.50    Types: Cigarettes    Quit date:  10/23/2016  . Smokeless tobacco: Never Used     Comment: Smoked for 50 years  . Alcohol use 2.4 oz/week    4 Cans of beer per week     Comment: Intermittent     Allergies   Ace inhibitors   Review of Systems Review of Systems  All other systems reviewed and are negative.    Physical Exam Updated Vital Signs Ht 5\' 5"  (1.651 m)   Wt 242 lb (109.8 kg)   SpO2 98%   BMI 40.27 kg/m   Physical Exam  Constitutional: She is oriented to person, place, and time. She appears well-developed and well-nourished. No distress.  HENT:  Head: Normocephalic and atraumatic.  Eyes: EOM are normal. Pupils are equal, round, and reactive to light.  Neck: Normal range of motion.  Cardiovascular:  Normal heart sounds.   Regular rhythm.  Bradycardia.  Pulmonary/Chest: Effort normal and breath sounds normal.  Abdominal: Soft. She exhibits no distension. There is no tenderness.  Musculoskeletal: Normal range of motion.  Neurological: She is alert and oriented to person, place, and time.  5/5 strength in major muscle groups of  bilateral upper and lower extremities.  Slurred speech. No facial asymetry.   Skin: Skin is warm and dry.  Psychiatric: She has a normal mood and affect. Judgment normal.  Nursing note and vitals reviewed.    ED Treatments / Results  Labs (all labs ordered are listed, but only abnormal results are displayed) Labs Reviewed  COMPREHENSIVE METABOLIC PANEL - Abnormal; Notable for the following:       Result Value   Glucose, Bld 177 (*)    BUN 22 (*)    Creatinine, Ser 1.14 (*)    Calcium 8.8 (*)    Albumin 3.1 (*)    AST 65 (*)    GFR calc non Af Amer 50 (*)    GFR calc Af Amer 58 (*)    All other components within normal limits  CBG MONITORING, ED - Abnormal; Notable for the following:    Glucose-Capillary 141 (*)    All other components within normal limits  CBC  ETHANOL  TROPONIN I  DIGOXIN LEVEL  PROTIME-INR    EKG  EKG  Interpretation  Date/Time:  Thursday November 20 2016 05:18:15 EST Ventricular Rate:  41 PR Interval:    QRS Duration: 111 QT Interval:  501 QTC Calculation: 414 R Axis:   89 Text Interpretation:  Sinus bradycardia Left atrial enlargement Borderline right axis deviation LVH with secondary repolarization abnormality inferior lateral ST changes No significant change was found Confirmed by Terek Bee  MD, Itati Brocksmith ([id number]) on 11/20/2016 7:06:43 AM       Radiology Ct Head Wo Contrast  Result Date: 11/20/2016 CLINICAL DATA:  Headache behind left eye.  Dizziness. EXAM: CT HEAD WITHOUT CONTRAST TECHNIQUE: Contiguous axial images were obtained from the base of the skull through the vertex without intravenous contrast. COMPARISON:  None. FINDINGS: Brain: No mass lesion, intraparenchymal hemorrhage or extra-axial collection. No evidence of acute cortical infarct. Brain parenchyma and CSF-containing spaces are normal for age. Vascular: Atherosclerotic calcification of the vertebral and internal carotid arteries at the skullbase. Skull: Normal visualized skull base, calvarium and extracranial soft tissues. Sinuses/Orbits: No sinus fluid levels or advanced mucosal thickening. No mastoid effusion. Normal orbits. IMPRESSION: No acute intracranial abnormality. Electronically Signed   By: Ulyses Jarred M.D.   On: 11/20/2016 06:21    Procedures Procedures (including critical care time)  Medications Ordered in ED Medications - No data to display   Initial Impression / Assessment and Plan / ED Course  I have reviewed the triage vital signs and the nursing notes.  Pertinent labs & imaging results that were available during my care of the patient were reviewed by me and considered in my medical decision making (see chart for details).  Clinical Course     Unclear etiology of slurred speech.  Patient will undergo MRI for further evaluation.  Digoxin level pending.  New bradycardia for the patient.  Heart rate  down in the 40s.  The lowest heart rate I saw was 39.  This appears to be a sinus bradycardia.  She'll need admission the hospital.  Orthostatic vital signs now.  MRI pending.  No chest pain shortness breath.  Final Clinical Impressions(s) / ED Diagnoses   Final  diagnoses:  Bradycardia  Slurred speech  Nonintractable headache, unspecified chronicity pattern, unspecified headache type    New Prescriptions New Prescriptions   No medications on file     Jola Schmidt, MD 11/20/16 [phone]

## 2016-11-20 NOTE — ED Notes (Signed)
Pt returned from MRI °

## 2016-11-20 NOTE — Progress Notes (Signed)
[id number] staff called to report critical troponin level of 0.03. MD notified.Will continue to monitor

## 2016-11-20 NOTE — Consult Note (Addendum)
NEURO HOSPITALIST CONSULT NOTE   Requestig physician: Dr. Marily Memos  Reason for Consult: Stroke  History obtained from:  Patient, Family and Chart     HPI:                                                                                                                                          Patricia Wagner is an 65 y.o. female who presented to the ED for evaluation of possible stroke. She first noted symptoms at about 4 AM this morning, consisting of left eye pain and left-sided headache. She also had gait unsteadiness and constant dizziness with N/V. Family also noted slurred speech and left facial droop. The patient denied vision changes. Symptoms somewhat improved since arriving to the ED, but still with dysarthria and left facial droop.   She was recently seen by Cardiology in early December for symptomatic persistent atrial fibrillation, resulting in CHF with reduced EF. She has been anticoagulated with Eliquis and is compliant with this. She was started on Sotalol 120mg  BID at that time.   PMHx also includes CAD, diverticulitis, chronic bilateral lower extremity edema, hypersomnia, HTN, morbid obesity and Raynaud's syndrome.   Past Medical History:  Diagnosis Date  . Arthritis   . CAD in native artery, s/p cardiac cath with non obstructive CAD 10/24/2016  . CHF (congestive heart failure) ([place])   . DIVERTICULITIS, HX OF 07/25/2007  . DIVERTICULOSIS, COLON 07/22/2007  . Dyspnea   . Edema, peripheral    a. chronic BLE edema, R>L. Prior trauma from dog attack and accident.  Marland Kitchen HYPERLIPIDEMIA 02/03/2008  . Hypersomnia    declines w/u  . Hypertension   . MENOPAUSAL DISORDER 01/09/2011  . Morbid obesity ([place]) 07/22/2007  . NICM (nonischemic cardiomyopathy) ([place]) 10/24/2016  . Raynaud's syndrome 07/22/2007  . THYROID NODULE, RIGHT 01/04/2010  . VITAMIN D DEFICIENCY 01/09/2011   Qualifier: Diagnosis of  By: [name], [name]     Past Surgical History:  Procedure Laterality  Date  . CARDIAC CATHETERIZATION N/A 10/23/2016   Procedure: Left Heart Cath and Coronary Angiography;  Surgeon: [name], MD;  Location: [place] [facility];  Service: Cardiovascular;  Laterality: N/A;  . CARDIOVERSION N/A 10/31/2016   Procedure: CARDIOVERSION;  Surgeon: [name], MD;  Location: [facility] ENDOSCOPY;  Service: Cardiovascular;  Laterality: N/A;  . CARDIOVERSION N/A 11/03/2016   Procedure: CARDIOVERSION;  Surgeon: [name], MD;  Location: [facility] ENDOSCOPY;  Service: Cardiovascular;  Laterality: N/A;  . CARDIOVERSION N/A 11/18/2016   Procedure: CARDIOVERSION;  Surgeon: [name], MD;  Location: [facility] ENDOSCOPY;  Service: Cardiovascular;  Laterality: N/A;  . CHOLECYSTECTOMY    . COLONOSCOPY    . PARTIAL HYSTERECTOMY     1 OVARY LEFT  . POLYPECTOMY    .  TEE WITHOUT CARDIOVERSION N/A 10/31/2016   Procedure: TRANSESOPHAGEAL ECHOCARDIOGRAM (TEE);  Surgeon: [name], MD;  Location: [facility] ENDOSCOPY;  Service: Cardiovascular;  Laterality: N/A;    Family History  Problem Relation Age of Onset  . Asthma Mother   . Diabetes Father   . Heart disease Father     Died of presumed heart attack - 37s  . Lung disease Sister   . Heart disease Sister     Twin sister has heart issue, unclear what kind  . Thyroid disease Neg Hx     Social History:  reports that she quit smoking about 4 weeks ago. Her smoking use included Cigarettes. She smoked 0.50 packs per day. She has never used smokeless tobacco. She reports that she drinks about 2.4 oz of alcohol per week . She reports that she does not use drugs.  Allergies  Allergen Reactions  . Ace Inhibitors Palpitations    MEDICATIONS:                                                                                                                     acetaminophen (TYLENOL) 325 MG tablet Take 2 tablets (650 mg total) by mouth every 4 (four) hours as needed for headache or mild pain. Isaiah Serge, NP Needs Review   Patient taking  differently: Take 325 mg by mouth every 4 (four) hours as needed for headache or mild pain.     digoxin (LANOXIN) 0.125 MG tablet Take 1 tablet (0.125 mg total) by mouth daily. Isaiah Serge, NP Needs Review  losartan (COZAAR) 25 MG tablet Take 1 tablet (25 mg total) by mouth daily. Patsey Berthold, NP Needs Review  metoprolol (LOPRESSOR) 50 MG tablet Take 1 tablet (50 mg total) by mouth 2 (two) times daily. Sherran Needs, NP Needs Review  potassium chloride SA (K-DUR,KLOR-CON) 20 MEQ tablet Take 1 tablet (20 mEq total) by mouth daily. Patsey Berthold, NP Needs Review  sotalol (BETAPACE) 120 MG tablet Take 1 tablet (120 mg total) by mouth every 12 (twelve) hours. Amber Sena Slate, NP Needs Review  Magnesium 250 MG TABS Take 1 tablet (250 mg total) by mouth daily. Sherran Needs, NP Needs Review   Patient not taking: Reported on 11/20/2016     ROS:  History obtained from patient. No wheezing, cough, palpitations, back pain or fever.    Blood pressure 171/90, pulse (!) 34, temperature 98.1 F (36.7 C), resp. rate 18, height 5\' 5"  (1.651 m), weight 109.8 kg (242 lb), SpO2 98 %.  General Examination:                                                                                                      HEENT-  Normocephalic/atraumatic.  Lungs- No gross wheezes. Respirations unlabored.  Extremities- Mild lower extremity edema.   Neurological Examination Mental Status: Drowsy. Oriented to self, "Wednesday", December, city and state. Speech fluent with dysarthria. Directional error with complex motor command, otherwise follows all instructions without difficulty. Mild impairment of repetition. Naming intact.   Cranial Nerves: II: Visual fields grossly normal, pupils equal, round, reactive to light and accommodation III,IV, VI: Left ptosis noted, extra-ocular motions normal  bilaterally with exception of saccadic quality to visual pursuits V,VII: Mild left facial droop. Dysarthric. Facial temperature sensation normal bilaterally VIII: hearing intact to conversation IX,X: No hypophonia XI: Lag on left with shoulder shrug XII: midline tongue extension Motor:  Right : Upper extremity   5/5    Left:     Upper extremity  4+/5 with lag and pronator drift noted  Lower extremity   5/5     Lower extremity   5/5 Normal tone throughout; no atrophy noted Sensory: Temperature and light touch intact x 4 with single stimuli. Left sided extinction to double simultaneous stimulation Deep Tendon Reflexes: 2+ and symmetric throughout Plantars: Right: downgoing   Left: downgoing Cerebellar: No ataxia with FNF bilaterally Gait: Deferred  Lab Results: Basic Metabolic Panel:  Recent Labs Lab 11/13/16 1612 11/13/16 1613 11/20/16 0527  NA  --  143 139  K  --  4.1 3.6  CL  --  102 101  CO2  --  35* 32  GLUCOSE  --  87 177*  BUN  --  21* 22*  CREATININE  --  1.21* 1.14*  CALCIUM  --  9.4 8.8*  MG 1.9  --   --     Liver Function Tests:  Recent Labs Lab 11/13/16 1613 11/20/16 0527  AST 114* 65*  ALT 76* 51  ALKPHOS 54 60  BILITOT 0.7 0.6  PROT 6.8 6.6  ALBUMIN 3.2* 3.1*   No results for input(s): LIPASE, AMYLASE in the last 168 hours. No results for input(s): AMMONIA in the last 168 hours.  CBC:  Recent Labs Lab 11/13/16 1612 11/20/16 0527  WBC 7.0 9.5  HGB 14.2 13.6  HCT 45.0 43.6  MCV 93.2 93.2  PLT 201 167    Cardiac Enzymes:  Recent Labs Lab 11/20/16 0527 11/20/16 1121  TROPONINI <0.03 <0.03    Lipid Panel: No results for input(s): CHOL, TRIG, HDL, CHOLHDL, VLDL, LDLCALC in the last 168 hours.  CBG:  Recent Labs Lab 11/20/16 0524  GLUCAP 141*    Microbiology: No results found for this or any previous visit.  Coagulation Studies: No results for input(s): LABPROT, INR in the last 72 hours.  Imaging: Ct Angio Head W/cm &/or  Wo Cm  Result Date: 11/20/2016 CLINICAL DATA:  Headache at the level the left eye beginning today. Dizziness. Stroke. Left superior cerebellar infarct. EXAM: CT ANGIOGRAPHY HEAD AND NECK TECHNIQUE: Multidetector CT imaging of the head and neck was performed using the standard protocol during bolus administration of intravenous contrast. Multiplanar CT image reconstructions and MIPs were obtained to evaluate the vascular anatomy. Carotid stenosis measurements (when applicable) are obtained utilizing NASCET criteria, using the distal internal carotid diameter as the denominator. CONTRAST:  50 mL Isovue 370 COMPARISON:  MRI of the brain from the same day. FINDINGS: CTA NECK FINDINGS Aortic arch: Study is somewhat degraded by patient motion. Atherosclerotic calcifications are present at the aortic arch without significant stenosis. Right carotid system: The right common carotid artery demonstrates mild tortuosity without significant stenosis. The bifurcation is unremarkable. There is mild tortuosity within the cervical right ICA is well. No focal stenosis present. Left carotid system: The left common carotid artery is within normal limits. Calcifications are present at the left carotid bifurcation without significant stenosis. The cervical left ICA is unremarkable. Vertebral arteries: The right vertebral artery originates from the subclavian artery. The origin is obscured by artifact. The left vertebral artery is not visualized proximally is likely occluded. It is reconstituted at the C2 level via musculoskeletal branches. There is no significant focal stenosis of the right vertebral artery in the neck. Skeleton: Multilevel endplate changes are present throughout cervical spine. Uncovertebral spurring contributes to osseous foraminal narrowing most evident at C4-5 and C5-6, left greater than right. Other neck: The soft tissues the neck are unremarkable. No significant adenopathy is present. Upper chest: The lung  apices demonstrate mild dependent atelectasis. No focal nodule or mass lesion is present. The superior mediastinum is otherwise unremarkable. The thoracic inlet is within normal limits. Review of the MIP images confirms the above findings CTA HEAD FINDINGS Anterior circulation: Atherosclerotic calcifications are present within the cavernous internal carotid arteries bilaterally without a significant stenosis through the ICA termini bilaterally. The A1 and M1 segments are normal. The MCA bifurcations are intact bilaterally. The anterior communicating artery is patent. Mild distal small vessel disease is present without a significant proximal stenosis or occlusion. Posterior circulation: The right vertebral artery is dominant to left. Calcifications are present at dural margin of the right vertebral artery without a significant stenosis. The vertebrobasilar junction is intact. The PICA origins are visualized and normal. The left superior cerebellar artery is occluded, consistent with the area of infarction. The left PICA is engorged. This likely represents luxury perfusion. Both posterior cerebral arteries originate from the basilar tip. Both posterior cerebral arteries originate from the basilar tip. The PCA branch vessels are within normal limits bilaterally. Venous sinuses: The dural sinuses are patent. The right transverse sinus is dominant. The straight sinus and deep cerebral veins are intact. Cortical veins are unremarkable. Anatomic variants: None Delayed phase: The postcontrast images demonstrate no pathologic enhancement. The left superior cerebellar infarct is again noted. Review of the MIP images confirms the above findings IMPRESSION: 1. Left superior cerebellar nonhemorrhagic infarct. 2. Occlusion of the left superior cerebellar artery. 3. Mild engorgement of the left posterior inferior cerebellar artery likely representing luxury perfusion. 4. Atherosclerotic calcifications at the dural margin of the  right vertebral artery in both cavernous internal carotid arteries without significant stenosis. 5. Mild atherosclerotic changes of the left carotid bifurcation and at the aortic arch without significant stenosis. 6. Multilevel spondylosis of the cervical spine is  most evident at C4-5 and C5-6. Electronically Signed   By: San Morelle M.D.   On: 11/20/2016 12:13   Ct Head Wo Contrast  Result Date: 11/20/2016 CLINICAL DATA:  Headache behind left eye.  Dizziness. EXAM: CT HEAD WITHOUT CONTRAST TECHNIQUE: Contiguous axial images were obtained from the base of the skull through the vertex without intravenous contrast. COMPARISON:  None. FINDINGS: Brain: No mass lesion, intraparenchymal hemorrhage or extra-axial collection. No evidence of acute cortical infarct. Brain parenchyma and CSF-containing spaces are normal for age. Vascular: Atherosclerotic calcification of the vertebral and internal carotid arteries at the skullbase. Skull: Normal visualized skull base, calvarium and extracranial soft tissues. Sinuses/Orbits: No sinus fluid levels or advanced mucosal thickening. No mastoid effusion. Normal orbits. IMPRESSION: No acute intracranial abnormality. Electronically Signed   By: Ulyses Jarred M.D.   On: 11/20/2016 06:21   Ct Angio Neck W/cm &/or Wo/cm  Result Date: 11/20/2016 CLINICAL DATA:  Headache at the level the left eye beginning today. Dizziness. Stroke. Left superior cerebellar infarct. EXAM: CT ANGIOGRAPHY HEAD AND NECK TECHNIQUE: Multidetector CT imaging of the head and neck was performed using the standard protocol during bolus administration of intravenous contrast. Multiplanar CT image reconstructions and MIPs were obtained to evaluate the vascular anatomy. Carotid stenosis measurements (when applicable) are obtained utilizing NASCET criteria, using the distal internal carotid diameter as the denominator. CONTRAST:  50 mL Isovue 370 COMPARISON:  MRI of the brain from the same day.  FINDINGS: CTA NECK FINDINGS Aortic arch: Study is somewhat degraded by patient motion. Atherosclerotic calcifications are present at the aortic arch without significant stenosis. Right carotid system: The right common carotid artery demonstrates mild tortuosity without significant stenosis. The bifurcation is unremarkable. There is mild tortuosity within the cervical right ICA is well. No focal stenosis present. Left carotid system: The left common carotid artery is within normal limits. Calcifications are present at the left carotid bifurcation without significant stenosis. The cervical left ICA is unremarkable. Vertebral arteries: The right vertebral artery originates from the subclavian artery. The origin is obscured by artifact. The left vertebral artery is not visualized proximally is likely occluded. It is reconstituted at the C2 level via musculoskeletal branches. There is no significant focal stenosis of the right vertebral artery in the neck. Skeleton: Multilevel endplate changes are present throughout cervical spine. Uncovertebral spurring contributes to osseous foraminal narrowing most evident at C4-5 and C5-6, left greater than right. Other neck: The soft tissues the neck are unremarkable. No significant adenopathy is present. Upper chest: The lung apices demonstrate mild dependent atelectasis. No focal nodule or mass lesion is present. The superior mediastinum is otherwise unremarkable. The thoracic inlet is within normal limits. Review of the MIP images confirms the above findings CTA HEAD FINDINGS Anterior circulation: Atherosclerotic calcifications are present within the cavernous internal carotid arteries bilaterally without a significant stenosis through the ICA termini bilaterally. The A1 and M1 segments are normal. The MCA bifurcations are intact bilaterally. The anterior communicating artery is patent. Mild distal small vessel disease is present without a significant proximal stenosis or  occlusion. Posterior circulation: The right vertebral artery is dominant to left. Calcifications are present at dural margin of the right vertebral artery without a significant stenosis. The vertebrobasilar junction is intact. The PICA origins are visualized and normal. The left superior cerebellar artery is occluded, consistent with the area of infarction. The left PICA is engorged. This likely represents luxury perfusion. Both posterior cerebral arteries originate from the basilar tip. Both posterior cerebral  arteries originate from the basilar tip. The PCA branch vessels are within normal limits bilaterally. Venous sinuses: The dural sinuses are patent. The right transverse sinus is dominant. The straight sinus and deep cerebral veins are intact. Cortical veins are unremarkable. Anatomic variants: None Delayed phase: The postcontrast images demonstrate no pathologic enhancement. The left superior cerebellar infarct is again noted. Review of the MIP images confirms the above findings IMPRESSION: 1. Left superior cerebellar nonhemorrhagic infarct. 2. Occlusion of the left superior cerebellar artery. 3. Mild engorgement of the left posterior inferior cerebellar artery likely representing luxury perfusion. 4. Atherosclerotic calcifications at the dural margin of the right vertebral artery in both cavernous internal carotid arteries without significant stenosis. 5. Mild atherosclerotic changes of the left carotid bifurcation and at the aortic arch without significant stenosis. 6. Multilevel spondylosis of the cervical spine is most evident at C4-5 and C5-6. Electronically Signed   By: San Morelle M.D.   On: 11/20/2016 12:13   Mr Brain Wo Contrast  Result Date: 11/20/2016 CLINICAL DATA:  New onset lightheadedness and dizziness. Nausea, vomiting, and diarrhea. EXAM: MRI HEAD WITHOUT CONTRAST TECHNIQUE: Multiplanar, multiecho pulse sequences of the brain and surrounding structures were obtained without  intravenous contrast. COMPARISON:  CT head without contrast 11/20/2016 FINDINGS: Brain: An acute/subacute nonhemorrhagic infarct is evident within the left superior cerebellum. No acute supratentorial infarct is present. Subtle T2 changes are associated with the infarct suggesting a more acute timeframe. A remote cortical infarct is present in the right occipital lobe medially. No other focal infarct is present. Mild periventricular T2 changes are within normal limits for age. The ventricles are normal size. No significant extra-axial fluid collection is present. Vascular: Flow is present in the major intracranial arteries. 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 The skullbase is otherwise within normal limits. The sella turcica is enlarged. No mass lesion is present. Midline sagittal structures are otherwise unremarkable. Degenerative changes are noted in the upper cervical spine with moderate cervical stenosis suggested at C4-5. Sinuses/Orbits: The paranasal sinuses and the mastoid air cells are clear. The globes and orbits are within normal limits. IMPRESSION: 1. Acute nonhemorrhagic left superior cerebellar infarct. 2. Normal flow is present within the major arteries of the circle of Willis. 3. Remote right occipital lobe infarct. Electronically Signed   By: San Morelle M.D.   On: 11/20/2016 08:41   Dg Chest Port 1 View  Result Date: 11/20/2016 CLINICAL DATA:  Shortness of breath. EXAM: PORTABLE CHEST 1 VIEW COMPARISON:  10/20/2016 and 05/04/2008 FINDINGS: There is cardiomegaly and aortic atherosclerosis. There is new pulmonary vascular congestion. No discrete effusions. IMPRESSION: Findings consistent with congestive heart failure with pulmonary vascular congestion and cardiomegaly. Aortic atherosclerosis. Electronically Signed   By: Lorriane Shire M.D.   On: 11/20/2016 11:34   Assessment: 65 year old female with acute left superior cerebellar hemisphere  ischemic infarction.  1. CTA reveals left superior cerebellar nonhemorrhagic infarct with associated occlusion of the left superior cerebellar artery. Also noted is mild engorgement of the left posterior inferior cerebellar artery likely representing luxury perfusion. There are atherosclerotic calcifications at the dural margin of the right vertebral artery and both cavernous internal carotid arteries without significant stenosis, as well as mild atherosclerotic changes of the left carotid bifurcation and at the aortic arch without significant stenosis.  2. MRI brain confirms acute nonhemorrhagic left superior cerebellar infarct. A remote right occipital lobe infarct is also noted. 3. CHF. TEE on 12/8 revealed severely  depressed LVEF of 20%. Evaluation of the left atrium revealed no evidence of thrombus in the atrial cavity or appendage.  Recommendations: 1. Continue Eliquis for now. Since she is classifiable as having failed this therapy, consider continuing ASA as an addition to her regimen or switching Eliquis to an oral anticoagulant of a different class (warfarin or dabigatran), which would likely have a lower bleeding risk than Eliquis + ASA.  2. TTE.  3. Switch Crestor to atorvastatin 40 mg po qd.  4. Would avoid permissive HTN in this patient given her CHF.  5. PT/OT/Speech.  6. Not an IV tPA candidate due to concurrent use of Eliquis.  Electronically signed: Dr. Kerney Elbe 11/20/2016, 12:41 PM

## 2016-11-20 NOTE — ED Notes (Signed)
Dr. Barbaraann Faster rounding at bedside

## 2016-11-20 NOTE — ED Triage Notes (Signed)
Pt in from home via [facility] with c/o dizziness, n/v/d, HA behind L eye, HR in 40's. Per EMS, pt got up at unknown time, fell while getting up from bed, denies LOC or injury. VS 174/98, 98%, HR in 40's-80's, CBG 74. Pt has hx of Afib. Pt lethargic on arrival, poor historian.

## 2016-11-20 NOTE — Progress Notes (Addendum)
Patient accepted to 1m12 from ED. She is A&O X4, denies pain at this time.MD notified of arrival. Will continue to monitor

## 2016-11-20 NOTE — ED Notes (Signed)
Patient transported to MRI 

## 2016-11-21 ENCOUNTER — Inpatient Hospital Stay (HOSPITAL_COMMUNITY): Payer: Managed Care, Other (non HMO)

## 2016-11-21 DIAGNOSIS — I639 Cerebral infarction, unspecified: Secondary | ICD-10-CM

## 2016-11-21 DIAGNOSIS — I6350 Cerebral infarction due to unspecified occlusion or stenosis of unspecified cerebral artery: Secondary | ICD-10-CM

## 2016-11-21 LAB — BASIC METABOLIC PANEL
ANION GAP: 7 (ref 5–15)
BUN: 14 mg/dL (ref 6–20)
CHLORIDE: 100 mmol/L — AB (ref 101–111)
CO2: 35 mmol/L — AB (ref 22–32)
Calcium: 8.9 mg/dL (ref 8.9–10.3)
Creatinine, Ser: 1 mg/dL (ref 0.44–1.00)
GFR calc non Af Amer: 59 mL/min — ABNORMAL LOW (ref >60)
Glucose, Bld: 98 mg/dL (ref 65–99)
Potassium: 3.9 mmol/L (ref 3.5–5.1)
SODIUM: 142 mmol/L (ref 135–145)

## 2016-11-21 LAB — LIPID PANEL
Cholesterol: 144 mg/dL (ref 0–200)
HDL: 43 mg/dL (ref >40)
LDL CALC: 89 mg/dL (ref 0–99)
TRIGLYCERIDES: 60 mg/dL (ref <150)
Total CHOL/HDL Ratio: 3.3 RATIO
VLDL: 12 mg/dL (ref 0–40)

## 2016-11-21 LAB — CBC
HEMATOCRIT: 42.8 % (ref 36.0–46.0)
HEMOGLOBIN: 13.3 g/dL (ref 12.0–15.0)
MCH: 29.1 pg (ref 26.0–34.0)
MCHC: 31.1 g/dL (ref 30.0–36.0)
MCV: 93.7 fL (ref 78.0–100.0)
Platelets: 160 10*3/uL (ref 150–400)
RBC: 4.57 MIL/uL (ref 3.87–5.11)
RDW: 14 % (ref 11.5–15.5)
WBC: 7.4 10*3/uL (ref 4.0–10.5)

## 2016-11-21 LAB — ECHOCARDIOGRAM COMPLETE
HEIGHTINCHES: 65 in
WEIGHTICAEL: 3872 [oz_av]

## 2016-11-21 LAB — HEMOGLOBIN A1C
HEMOGLOBIN A1C: 6.3 % — AB (ref 4.8–5.6)
Mean Plasma Glucose: 134 mg/dL

## 2016-11-21 MED ORDER — FUROSEMIDE 10 MG/ML IJ SOLN
40.0000 mg | Freq: Two times a day (BID) | INTRAMUSCULAR | Status: DC
  Administered 2016-11-21 – 2016-11-24 (×6): 40 mg via INTRAVENOUS
  Filled 2016-11-21 (×6): qty 4

## 2016-11-21 MED ORDER — ORAL CARE MOUTH RINSE
15.0000 mL | Freq: Two times a day (BID) | OROMUCOSAL | Status: DC
  Administered 2016-11-22 – 2016-11-24 (×3): 15 mL via OROMUCOSAL

## 2016-11-21 NOTE — Evaluation (Signed)
Occupational Therapy Evaluation Patient Details Name: Patricia Wagner MRN: [id number] DOB: [birth year] Today's Date: 11/21/2016    History of Present Illness 65 yo female admitted with L facial droop and slurred speech MRI (+) L superior cerebellar hemisphere ischemic infarct . Pt with recent admission for AFib RVR and under went TEE and x2 cardioversion ( 12/11 and 12/26)   Clinical Impression   PT admitted with L superior cerebellar hemisphere ischemic infarct. Pt currently with functional limitiations due to the deficits listed below (see OT problem list). PTA was independent with all adls. Pt with recent workup for cardiac Afib and cardioversion x2. Pt denies SOB. Pt on RA on arrival with Oxygen saturations 72% and increased to 95% on 3 L O2.  Pt will benefit from skilled OT to increase their independence and safety with adls and balance to allow discharge CIR .     Follow Up Recommendations  CIR    Equipment Recommendations  3 in 1 bedside commode;Other (comment) (RW)    Recommendations for Other Services Rehab consult     Precautions / Restrictions Precautions Precautions: Fall      Mobility Bed Mobility      General bed mobility comments: in chair on arrival  Transfers Overall transfer level: Needs assistance Equipment used: Rolling walker (2 wheeled) Transfers: Sit to/from Stand Sit to Stand: Min guard         General transfer comment: Pt requires repositioning of hands x3 times prior to attempting sit<> stand. Pt able to power up into standing but unsteady immediately upon standing    Balance Overall balance assessment: Needs assistance Sitting-balance support: Feet supported Sitting balance-Leahy Scale: Fair     Standing balance support: Single extremity supported;During functional activity Standing balance-Leahy Scale: Fair Standing balance comment: reliance on single UE support in static standing but was able to perform pericare with min guard pt  unable to perform posterior peri care at all this session                            ADL       11/21/16 1000  ADL  Overall ADL's  Needs assistance/impaired  Eating/Feeding Set up;Sitting  Grooming Wash/dry hands;Wash/dry face;Oral care;Standing  Grooming Details (indicate cue type and reason) relies on single UE for balance  Upper Body Bathing Minimal assistance  Lower Body Bathing Total assistance  Upper Body Dressing  Minimal assistance  Lower Body Dressing Total assistance  Toilet Transfer Moderate assistance  Toilet Transfer Details (indicate cue type and reason) pt required (A) with Rw to sequence to seat  Toileting- Clothing Manipulation and Hygiene Total assistance  Toileting - Clothing Manipulation Details (indicate cue type and reason) unable to complete posterior peri care  Functional mobility during ADLs Minimal assistance                                             Vision Additional Comments: additional head turns and scanning required during session   Perception     Praxis      Pertinent Vitals/Pain Pain Assessment: No/denies pain     Hand Dominance Right   Extremity/Trunk Assessment Upper Extremity Assessment Upper Extremity Assessment: Overall WFL for tasks assessed   Lower Extremity Assessment Lower Extremity Assessment: Overall WFL for tasks assessed   Cervical / Trunk Assessment Cervical / Trunk  Assessment: Normal   Communication Communication Communication: No difficulties   Cognition Arousal/Alertness: Awake/alert Behavior During Therapy: Anxious Overall Cognitive Status: No family/caregiver present to determine baseline cognitive functioning                     General Comments       Exercises       Shoulder Instructions      Home Living Family/patient expects to be discharged to:: Private residence Living Arrangements: Alone Available Help at Discharge: Family Type of Home: Apartment Home  Access: Level entry     Home Layout: One level     Bathroom Shower/Tub: Teacher, early years/pre: Standard     Home Equipment: None          Prior Functioning/Environment Level of Independence: Independent                 OT Problem List: Decreased strength;Decreased activity tolerance;Impaired balance (sitting and/or standing);Decreased safety awareness;Decreased knowledge of use of DME or AE;Decreased knowledge of precautions;Decreased cognition;Impaired vision/perception;Increased edema;Cardiopulmonary status limiting activity   OT Treatment/Interventions: Self-care/ADL training;Therapeutic exercise;Neuromuscular education;Energy conservation;DME and/or AE instruction;Therapeutic activities;Cognitive remediation/compensation;Visual/perceptual remediation/compensation;Patient/family education;Balance training    OT Goals(Current goals can be found in the care plan section) Acute Rehab OT Goals Patient Stated Goal: to spend time with grandbaby that is 2. pt states "she is my heart" OT Goal Formulation: With patient/family Time For Goal Achievement: 12/05/16 Potential to Achieve Goals: Good  OT Frequency: Min 3X/week   Barriers to D/C: Decreased caregiver support          Co-evaluation              End of Session Equipment Utilized During Treatment: Gait belt;Rolling walker;Oxygen Nurse Communication: Mobility status;Precautions  Activity Tolerance: Patient tolerated treatment well Patient left: in chair;with call bell/phone within reach;with restraints reapplied (chair alarm box does not fit pad RN notified)   Time: 1694-5038 OT Time Calculation (min): 44 min Charges:  OT General Charges $OT Visit: 1 Procedure OT Evaluation $OT Eval Moderate Complexity: 1 Procedure OT Treatments $Self Care/Home Management : 23-37 mins G-Codes:    Parke Poisson B Dec 04, 2016, 11:26 AM  Jeri Modena   OTR/L Pager: [phone] Office: [phone] .

## 2016-11-21 NOTE — Progress Notes (Signed)
RN spoke with lab technician. No magnesium or phosphorus blood specimens collected. RN reordered magnesium and phosphorus to be drawn with next lab draw per transcribed order at 0500.RN will continue to monitor.

## 2016-11-21 NOTE — Progress Notes (Signed)
  Echocardiogram 2D Echocardiogram has been performed.  Patricia Wagner 11/21/2016, 9:08 AM

## 2016-11-21 NOTE — Progress Notes (Signed)
STROKE TEAM PROGRESS NOTE   HISTORY OF PRESENT ILLNESS (per record) Patricia Wagner is an 65 y.o. female who presented to the ED for evaluation of possible stroke. She first noted symptoms at about 4 AM this morning 11/18/2016, consisting of left eye pain and left-sided headache. She also had gait unsteadiness and constant dizziness with N/V. Family also noted slurred speech and left facial droop. The patient denied vision changes. Symptoms somewhat improved since arriving to the ED, but still with dysarthria and left facial droop. She was recently seen by Cardiology in early December for symptomatic persistent atrial fibrillation, resulting in CHF with reduced EF. She has been anticoagulated with Eliquis and is compliant with this. She was started on Sotalol 120mg  BID at that time.  PMHx also includes CAD, diverticulitis, chronic bilateral lower extremity edema, hypersomnia, HTN, morbid obesity and Raynaud's syndrome. Patient was not administered IV t-PA secondary to being on eliquis. She was admitted for further evaluation and treatment.   SUBJECTIVE (INTERVAL HISTORY) Her family member and OT is at the bedside.  Overall she feels her condition is stable.  Complains of SOB. Worried that her O2 level is low on room air. Will ask medical provider to address. Discussed with pt and family.    OBJECTIVE Temp:  [98.1 F (36.7 C)-99.1 F (37.3 C)] 98.5 F (36.9 C) (12/29 0954) Pulse Rate:  [42-83] 83 (12/29 0514) Cardiac Rhythm: Atrial flutter (12/29 0813) Resp:  [12-19] 18 (12/29 0954) BP: (131-163)/(63-87) 139/82 (12/29 0954) SpO2:  [88 %-100 %] 97 % (12/29 0954)  CBC:  Recent Labs Lab 11/20/16 0527 11/21/16 0541  WBC 9.5 7.4  HGB 13.6 13.3  HCT 43.6 42.8  MCV 93.2 93.7  PLT 167 578    Basic Metabolic Panel:  Recent Labs Lab 11/20/16 0527 11/21/16 0541  NA 139 142  K 3.6 3.9  CL 101 100*  CO2 32 35*  GLUCOSE 177* 98  BUN 22* 14  CREATININE 1.14* 1.00  CALCIUM 8.8* 8.9     Lipid Panel:    Component Value Date/Time   CHOL 144 11/21/2016 0541   TRIG 60 11/21/2016 0541   HDL 43 11/21/2016 0541   CHOLHDL 3.3 11/21/2016 0541   VLDL 12 11/21/2016 0541   LDLCALC 89 11/21/2016 0541   HgbA1c:  Lab Results  Component Value Date   HGBA1C 5.8 (H) 10/20/2016   Urine Drug Screen: No results found for: LABOPIA, COCAINSCRNUR, LABBENZ, AMPHETMU, THCU, LABBARB    IMAGING  Ct Head Wo Contrast 11/20/2016 No acute intracranial abnormality.   Ct Angio Head W/cm &/or Wo Cm Ct Angio Neck W/cm &/or Wo/cm 11/20/2016 1. Left superior cerebellar nonhemorrhagic infarct. 2. Occlusion of the left superior cerebellar artery. 3. Mild engorgement of the left posterior inferior cerebellar artery likely representing luxury perfusion. 4. Atherosclerotic calcifications at the dural margin of the right vertebral artery in both cavernous internal carotid arteries without significant stenosis. 5. Mild atherosclerotic changes of the left carotid bifurcation and at the aortic arch without significant stenosis. 6. Multilevel spondylosis of the cervical spine is most evident at C4-5 and C5-6.   Mr Brain Wo Contrast 11/20/2016 1. Acute nonhemorrhagic left superior cerebellar infarct. 2. Normal flow is present within the major arteries of the circle of Willis. 3. Remote right occipital lobe infarct.   Dg Chest Port 1 View 11/20/2016 Findings consistent with congestive heart failure with pulmonary vascular congestion and cardiomegaly. Aortic atherosclerosis.   2D Echocardiogram  pending    PHYSICAL EXAM Pleasant middle-aged  obese African-American lady who is mildly short of breath. She is on oxygen. . Afebrile. Head is nontraumatic. Neck is supple without bruit.    Cardiac exam no murmur or gallop. Lungs are clear to auscultation. Distal pulses are well felt. Neurological Exam ;  Awake  Alert oriented x 3. Normal speech and language.eye movements full without nystagmus.fundi were not  visualized. Vision acuity and fields appear normal. Hearing is normal. Palatal movements are normal. Face symmetric. Tongue midline. Normal strength, tone, reflexes and coordination. Mildly diminished fine finger movements on the left and orbits right over left upper extremity. Normal sensation. Gait deferred.  ASSESSMENT/PLAN Patricia Wagner is a 65 y.o. female with history of symptomatic persistent atrial fibrillation, resulting in CHF with reduced EF anticoagulated with Eliquis, CAD, diverticulitis, chronic bilateral lower extremity edema, hypersomnia, HTN, morbid obesity and Raynaud's syndrome presenting with L eye pain and L sided HA, unsteady gait and dizziness w/ N&V. She did not receive IV t-PA due to being on eliquis.   Stroke:  superior cerebellar infarct embolic secondary to known atrial fibrillation   MRI  L superior cerebellar infarct. Old R occipital infarct.  CTA head and neck L superior cerebella infarct. Occluded L superior cerebellar artery.   2D Echo  pending   LDL 89  HgbA1c 5.8  Eliquis for VTE prophylaxis  Diet Heart Room service appropriate? Yes; Fluid consistency: Thin  Eliquis (apixaban) daily prior to admission, now on Eliquis (apixaban) daily  Patient counseled to be compliant with her antithrombotic medications  Ongoing aggressive stroke risk factor management  Therapy recommendations:  CIR  Disposition:  pending   Atrial Fibrillation  Recent cardioversion x 3 in the past 3 weeks  Home anticoagulation:  Eliquis (apixaban) daily continued in the hospital  [place] following\  Continue eliquis at discharge    Hypertension  Stable  Permissive hypertension (OK if < 220/120) but gradually normalize in 5-7 days  Long-term BP goal normotensive  Hyperlipidemia  Home meds:  crestor 5, resumed in hospital  LDL 89, goal < 70  Continue statin at discharge  Other Stroke Risk Factors  Cigarette smoker, quick smoking 4 weeks ago, advised to stop  smoking  ETOH use, advised to drink no more than 1 drink(s) a day  Morbid Obesity, Body mass index is 40.27 kg/m., recommend weight loss, diet and exercise as appropriate   Coronary artery disease  Systolic CHF - EF 09-73% - complains of SOB   Other Active Problems  SOB - internal medicine MD to address. Dr. Leonie Man discussed w/ him  Hospital day # [place] for Pager information 11/21/2016 3:08 PM  I have personally examined this patient, reviewed notes, independently viewed imaging studies, participated in medical decision making and plan of care.ROS completed by me personally and pertinent positives fully documented  I have made any additions or clarifications directly to the above note. Agree with note above. Patient has presented with recurrent embolic stroke despite being on anticoagulation with eliquis and she states that she was compliant with it. I have discussed alternative medications to eliquis and patient prefers warfarin. We will discuss with cardiology and make a final decision. Greater than 50% time during this 35 minute visit was spent on counseling and coordination of care about stroke, intermittent fibrillation, risk prevention and treatment  Antony Contras, MD Medical Director [facility] Pager: [phone] 11/21/2016 3:13 PM  To contact Stroke Continuity provider, please refer to [email or web address] After  hours, contact General Neurology

## 2016-11-21 NOTE — Consult Note (Signed)
Admit date: 11/20/2016 Referring Physician  Dr. Cruzita Lederer Primary Physician  Dr. Cathlean Cower Primary Cardiologist  Dr. Lubertha South. Allred Reason for Consultation  Bradycardia   HPI: This is a 65 y.o. female with a  past medical history significant for persistent atrial fibrillation, non ischemic cardiomyopathy, hyperlipidemia, and hypertension. Because of cardiomyopathy, it was felt that she would require AAD therapy to maintain SR. She was concerned about cost for Tikosyn and so Sotalol was initiated. She underwent cardioversion on 11/03/16 with restorationof sinus rhythm. Her fluid status was improved on discharge at 239 lbs.  F/u in afib clinic 12/14 and is found to be in atrial flutter with RVR at 112 bpm. Pt is unaware of heart beat. Fluid status was stable per pt and was no longer having LLE, PND/orthopnea.  F/u afib clinic 12/20. She was still in afib but rate is controlled. BB was increased on last visit. She did not want another cardioversion at that time.   F/u 12/21, due to feeling poorly, more short of breath, still having diarrhea, and patient thought increase of metoprolol caused loose stools and stopping magnesium for several days did not help. She continued in rate controlled afib but her weight was up 2 lbs. She denied missing any sotalol or eliquis. Her metoprolol was decreased due to bradycardia.  She underwent successful DCCV to NSA on 12/26. Of note her LA size on last echo was 20mm.   She has a known nonischemic DCM with EF 20-25%.   She presented to the ER today with complaints of left eye pain and left-sided headach that started about 4am and associated with dizziness. She had a sngle episode of nausea and vomiting. Dizziness was constant. Denied any blurred vision or vision loss. Overall symptoms slowly improved. Patient reported doing very dry mouth and having difficulty ambulating to the bathroom. After arriving in the emergency room patient complainted of some  intermittent shortness of breath but denied any coughing, wheezing, fevers. She denies any recent palpitations since last cardioversion. Patient endorses compliance with home medications. Patient unsure of what all of her medicines are formed but states that she takes everything she's been prescribed.  In ER, Patient found to be bradycardic. EKG showed sinus bradycardia with HR in the 40's.  MRI ordered by EDP to evaluate for dizziness and possible stroke. Cardiology is asked to consult for further recommendations on bradycardia.  Currently her Sotolol, dig and BB are on hold.     PMH:   Past Medical History:  Diagnosis Date  . Arthritis   . CAD in native artery, s/p cardiac cath with non obstructive CAD 10/24/2016  . CHF (congestive heart failure) ([place])   . DIVERTICULITIS, HX OF 07/25/2007  . DIVERTICULOSIS, COLON 07/22/2007  . Dyspnea   . Edema, peripheral    a. chronic BLE edema, R>L. Prior trauma from dog attack and accident.  Marland Kitchen HYPERLIPIDEMIA 02/03/2008  . Hypersomnia    declines w/u  . Hypertension   . MENOPAUSAL DISORDER 01/09/2011  . Morbid obesity ([place]) 07/22/2007  . NICM (nonischemic cardiomyopathy) ([place]) 10/24/2016  . Raynaud's syndrome 07/22/2007  . THYROID NODULE, RIGHT 01/04/2010  . VITAMIN D DEFICIENCY 01/09/2011   Qualifier: Diagnosis of  By: [name], [name]      PSH:   Past Surgical History:  Procedure Laterality Date  . CARDIAC CATHETERIZATION N/A 10/23/2016   Procedure: Left Heart Cath and Coronary Angiography;  Surgeon: [name], MD;  Location: [place] CV LAB;  Service: Cardiovascular;  Laterality: N/A;  . CARDIOVERSION N/A 10/31/2016   Procedure: CARDIOVERSION;  Surgeon: [name], MD;  Location: [facility] ENDOSCOPY;  Service: Cardiovascular;  Laterality: N/A;  . CARDIOVERSION N/A 11/03/2016   Procedure: CARDIOVERSION;  Surgeon: [name], MD;  Location: [facility] ENDOSCOPY;  Service: Cardiovascular;  Laterality: N/A;  . CARDIOVERSION N/A 11/18/2016    Procedure: CARDIOVERSION;  Surgeon: [name], MD;  Location: [facility] ENDOSCOPY;  Service: Cardiovascular;  Laterality: N/A;  . CHOLECYSTECTOMY    . COLONOSCOPY    . PARTIAL HYSTERECTOMY     1 OVARY LEFT  . POLYPECTOMY    . TEE WITHOUT CARDIOVERSION N/A 10/31/2016   Procedure: TRANSESOPHAGEAL ECHOCARDIOGRAM (TEE);  Surgeon: [name], MD;  Location: [facility] ENDOSCOPY;  Service: Cardiovascular;  Laterality: N/A;    Allergies:  Ace inhibitors Prior to Admit Meds:   Prescriptions Prior to Admission  Medication Sig Dispense Refill Last Dose  . acetaminophen (TYLENOL) 325 MG tablet Take 2 tablets (650 mg total) by mouth every 4 (four) hours as needed for headache or mild pain. (Patient taking differently: Take 325 mg by mouth every 4 (four) hours as needed for headache or mild pain. )   unk at unk  . apixaban (ELIQUIS) 5 MG TABS tablet Take 1 tablet (5 mg total) by mouth 2 (two) times daily. 60 tablet 6 11/19/2016 at 1900  . digoxin (LANOXIN) 0.125 MG tablet Take 1 tablet (0.125 mg total) by mouth daily. 30 tablet 6 11/19/2016 at Unknown time  . furosemide (LASIX) 40 MG tablet Take 1.5 tablets (60 mg total) by mouth daily. 30 tablet 1 11/19/2016 at Unknown time  . losartan (COZAAR) 25 MG tablet Take 1 tablet (25 mg total) by mouth daily. 30 tablet 1 11/19/2016 at Unknown time  . magnesium oxide (MAG-OX) 400 MG tablet Take 400 mg by mouth daily.   11/19/2016 at Unknown time  . metoprolol (LOPRESSOR) 50 MG tablet Take 1 tablet (50 mg total) by mouth 2 (two) times daily. 60 tablet 6 11/19/2016 at 1900  . potassium chloride SA (K-DUR,KLOR-CON) 20 MEQ tablet Take 1 tablet (20 mEq total) by mouth daily. 30 tablet 1 11/19/2016 at Unknown time  . sotalol (BETAPACE) 120 MG tablet Take 1 tablet (120 mg total) by mouth every 12 (twelve) hours. 60 tablet 1 11/19/2016 at Unknown time  . Magnesium 250 MG TABS Take 1 tablet (250 mg total) by mouth daily. (Patient not taking: Reported on 11/20/2016)  0 Not Taking at  Unknown time  . rosuvastatin (CRESTOR) 10 MG tablet Take 1 tablet (10 mg total) by mouth daily. 90 tablet 3 unk at unk   Fam HX:    Family History  Problem Relation Age of Onset  . Asthma Mother   . Diabetes Father   . Heart disease Father     Died of presumed heart attack - 34s  . Lung disease Sister   . Heart disease Sister     Twin sister has heart issue, unclear what kind  . Thyroid disease Neg Hx    Social HX:    Social History   Social History  . Marital status: Single    Spouse name: N/A  . Number of children: 1  . Years of education: N/A   Occupational History  . Printing     Social History Main Topics  . Smoking status: Former Smoker    Packs/day: 0.50    Types: Cigarettes    Quit date: 10/23/2016  . Smokeless tobacco: Never Used  Comment: Smoked for 50 years  . Alcohol use 2.4 oz/week    4 Cans of beer per week     Comment: Intermittent  . Drug use: No  . Sexual activity: Not on file   Other Topics Concern  . Not on file   Social History Narrative  . No narrative on file     ROS:  All 11 ROS were addressed and are negative except what is stated in the HPI  Physical Exam: Blood pressure 139/82, pulse 83, temperature 98.5 F (36.9 C), resp. rate 18, height 5\' 5"  (1.651 m), weight 242 lb (109.8 kg), SpO2 97 %.    General: Well developed, well nourished, in no acute distress Head: Eyes PERRLA, No xanthomas.   Normal cephalic and atramatic  Lungs:   Clear bilaterally to auscultation and percussion. Heart:   HRRR S1 S2 Pulses are 2+ & equal.            No carotid bruit. No JVD.  No abdominal bruits. No femoral bruits. Abdomen: Bowel sounds are positive, abdomen soft and non-tender without masses Msk:  Back normal, normal gait. Normal strength and tone for age. Extremities:   No clubbing, cyanosis or edema.  DP +1 Neuro: Alert and oriented X 3. Psych:  Good affect, responds appropriately    Labs:   Lab Results  Component Value Date   WBC 7.4  11/21/2016   HGB 13.3 11/21/2016   HCT 42.8 11/21/2016   MCV 93.7 11/21/2016   PLT 160 11/21/2016    Recent Labs Lab 11/20/16 0527 11/21/16 0541  NA 139 142  K 3.6 3.9  CL 101 100*  CO2 32 35*  BUN 22* 14  CREATININE 1.14* 1.00  CALCIUM 8.8* 8.9  PROT 6.6  --   BILITOT 0.6  --   ALKPHOS 60  --   ALT 51  --   AST 65*  --   GLUCOSE 177* 98   No results found for: PTT Lab Results  Component Value Date   INR 1.17 11/20/2016   INR 1.1 10/28/2016   INR 1.16 10/20/2016   Lab Results  Component Value Date   TROPONINI 0.03 (HH) 11/20/2016     Lab Results  Component Value Date   CHOL 144 11/21/2016   CHOL 153 10/21/2016   CHOL 217 (H) 10/25/2015   Lab Results  Component Value Date   HDL 43 11/21/2016   HDL 46 10/21/2016   HDL 63.40 10/25/2015   Lab Results  Component Value Date   LDLCALC 89 11/21/2016   LDLCALC 95 10/21/2016   LDLCALC 137 (H) 10/25/2015   Lab Results  Component Value Date   TRIG 60 11/21/2016   TRIG 58 10/21/2016   TRIG 80.0 10/25/2015   Lab Results  Component Value Date   CHOLHDL 3.3 11/21/2016   CHOLHDL 3.3 10/21/2016   CHOLHDL 3 10/25/2015   Lab Results  Component Value Date   LDLDIRECT 121.6 03/15/2012   LDLDIRECT 133.4 01/09/2011   LDLDIRECT 124.5 01/04/2010      Radiology:  Ct Angio Head W/cm &/or Wo Cm  Result Date: 11/20/2016 CLINICAL DATA:  Headache at the level the left eye beginning today. Dizziness. Stroke. Left superior cerebellar infarct. EXAM: CT ANGIOGRAPHY HEAD AND NECK TECHNIQUE: Multidetector CT imaging of the head and neck was performed using the standard protocol during bolus administration of intravenous contrast. Multiplanar CT image reconstructions and MIPs were obtained to evaluate the vascular anatomy. Carotid stenosis measurements (when applicable) are obtained utilizing  NASCET criteria, using the distal internal carotid diameter as the denominator. CONTRAST:  50 mL Isovue 370 COMPARISON:  MRI of the brain  from the same day. FINDINGS: CTA NECK FINDINGS Aortic arch: Study is somewhat degraded by patient motion. Atherosclerotic calcifications are present at the aortic arch without significant stenosis. Right carotid system: The right common carotid artery demonstrates mild tortuosity without significant stenosis. The bifurcation is unremarkable. There is mild tortuosity within the cervical right ICA is well. No focal stenosis present. Left carotid system: The left common carotid artery is within normal limits. Calcifications are present at the left carotid bifurcation without significant stenosis. The cervical left ICA is unremarkable. Vertebral arteries: The right vertebral artery originates from the subclavian artery. The origin is obscured by artifact. The left vertebral artery is not visualized proximally is likely occluded. It is reconstituted at the C2 level via musculoskeletal branches. There is no significant focal stenosis of the right vertebral artery in the neck. Skeleton: Multilevel endplate changes are present throughout cervical spine. Uncovertebral spurring contributes to osseous foraminal narrowing most evident at C4-5 and C5-6, left greater than right. Other neck: The soft tissues the neck are unremarkable. No significant adenopathy is present. Upper chest: The lung apices demonstrate mild dependent atelectasis. No focal nodule or mass lesion is present. The superior mediastinum is otherwise unremarkable. The thoracic inlet is within normal limits. Review of the MIP images confirms the above findings CTA HEAD FINDINGS Anterior circulation: Atherosclerotic calcifications are present within the cavernous internal carotid arteries bilaterally without a significant stenosis through the ICA termini bilaterally. The A1 and M1 segments are normal. The MCA bifurcations are intact bilaterally. The anterior communicating artery is patent. Mild distal small vessel disease is present without a significant proximal  stenosis or occlusion. Posterior circulation: The right vertebral artery is dominant to left. Calcifications are present at dural margin of the right vertebral artery without a significant stenosis. The vertebrobasilar junction is intact. The PICA origins are visualized and normal. The left superior cerebellar artery is occluded, consistent with the area of infarction. The left PICA is engorged. This likely represents luxury perfusion. Both posterior cerebral arteries originate from the basilar tip. Both posterior cerebral arteries originate from the basilar tip. The PCA branch vessels are within normal limits bilaterally. Venous sinuses: The dural sinuses are patent. The right transverse sinus is dominant. The straight sinus and deep cerebral veins are intact. Cortical veins are unremarkable. Anatomic variants: None Delayed phase: The postcontrast images demonstrate no pathologic enhancement. The left superior cerebellar infarct is again noted. Review of the MIP images confirms the above findings IMPRESSION: 1. Left superior cerebellar nonhemorrhagic infarct. 2. Occlusion of the left superior cerebellar artery. 3. Mild engorgement of the left posterior inferior cerebellar artery likely representing luxury perfusion. 4. Atherosclerotic calcifications at the dural margin of the right vertebral artery in both cavernous internal carotid arteries without significant stenosis. 5. Mild atherosclerotic changes of the left carotid bifurcation and at the aortic arch without significant stenosis. 6. Multilevel spondylosis of the cervical spine is most evident at C4-5 and C5-6. Electronically Signed   By: San Morelle M.D.   On: 11/20/2016 12:13   Ct Head Wo Contrast  Result Date: 11/20/2016 CLINICAL DATA:  Headache behind left eye.  Dizziness. EXAM: CT HEAD WITHOUT CONTRAST TECHNIQUE: Contiguous axial images were obtained from the base of the skull through the vertex without intravenous contrast. COMPARISON:   None. FINDINGS: Brain: No mass lesion, intraparenchymal hemorrhage or extra-axial collection. No evidence of  acute cortical infarct. Brain parenchyma and CSF-containing spaces are normal for age. Vascular: Atherosclerotic calcification of the vertebral and internal carotid arteries at the skullbase. Skull: Normal visualized skull base, calvarium and extracranial soft tissues. Sinuses/Orbits: No sinus fluid levels or advanced mucosal thickening. No mastoid effusion. Normal orbits. IMPRESSION: No acute intracranial abnormality. Electronically Signed   By: Ulyses Jarred M.D.   On: 11/20/2016 06:21   Ct Angio Neck W/cm &/or Wo/cm  Result Date: 11/20/2016 CLINICAL DATA:  Headache at the level the left eye beginning today. Dizziness. Stroke. Left superior cerebellar infarct. EXAM: CT ANGIOGRAPHY HEAD AND NECK TECHNIQUE: Multidetector CT imaging of the head and neck was performed using the standard protocol during bolus administration of intravenous contrast. Multiplanar CT image reconstructions and MIPs were obtained to evaluate the vascular anatomy. Carotid stenosis measurements (when applicable) are obtained utilizing NASCET criteria, using the distal internal carotid diameter as the denominator. CONTRAST:  50 mL Isovue 370 COMPARISON:  MRI of the brain from the same day. FINDINGS: CTA NECK FINDINGS Aortic arch: Study is somewhat degraded by patient motion. Atherosclerotic calcifications are present at the aortic arch without significant stenosis. Right carotid system: The right common carotid artery demonstrates mild tortuosity without significant stenosis. The bifurcation is unremarkable. There is mild tortuosity within the cervical right ICA is well. No focal stenosis present. Left carotid system: The left common carotid artery is within normal limits. Calcifications are present at the left carotid bifurcation without significant stenosis. The cervical left ICA is unremarkable. Vertebral arteries: The right  vertebral artery originates from the subclavian artery. The origin is obscured by artifact. The left vertebral artery is not visualized proximally is likely occluded. It is reconstituted at the C2 level via musculoskeletal branches. There is no significant focal stenosis of the right vertebral artery in the neck. Skeleton: Multilevel endplate changes are present throughout cervical spine. Uncovertebral spurring contributes to osseous foraminal narrowing most evident at C4-5 and C5-6, left greater than right. Other neck: The soft tissues the neck are unremarkable. No significant adenopathy is present. Upper chest: The lung apices demonstrate mild dependent atelectasis. No focal nodule or mass lesion is present. The superior mediastinum is otherwise unremarkable. The thoracic inlet is within normal limits. Review of the MIP images confirms the above findings CTA HEAD FINDINGS Anterior circulation: Atherosclerotic calcifications are present within the cavernous internal carotid arteries bilaterally without a significant stenosis through the ICA termini bilaterally. The A1 and M1 segments are normal. The MCA bifurcations are intact bilaterally. The anterior communicating artery is patent. Mild distal small vessel disease is present without a significant proximal stenosis or occlusion. Posterior circulation: The right vertebral artery is dominant to left. Calcifications are present at dural margin of the right vertebral artery without a significant stenosis. The vertebrobasilar junction is intact. The PICA origins are visualized and normal. The left superior cerebellar artery is occluded, consistent with the area of infarction. The left PICA is engorged. This likely represents luxury perfusion. Both posterior cerebral arteries originate from the basilar tip. Both posterior cerebral arteries originate from the basilar tip. The PCA branch vessels are within normal limits bilaterally. Venous sinuses: The dural sinuses are  patent. The right transverse sinus is dominant. The straight sinus and deep cerebral veins are intact. Cortical veins are unremarkable. Anatomic variants: None Delayed phase: The postcontrast images demonstrate no pathologic enhancement. The left superior cerebellar infarct is again noted. Review of the MIP images confirms the above findings IMPRESSION: 1. Left superior cerebellar nonhemorrhagic infarct. 2.  Occlusion of the left superior cerebellar artery. 3. Mild engorgement of the left posterior inferior cerebellar artery likely representing luxury perfusion. 4. Atherosclerotic calcifications at the dural margin of the right vertebral artery in both cavernous internal carotid arteries without significant stenosis. 5. Mild atherosclerotic changes of the left carotid bifurcation and at the aortic arch without significant stenosis. 6. Multilevel spondylosis of the cervical spine is most evident at C4-5 and C5-6. Electronically Signed   By: San Morelle M.D.   On: 11/20/2016 12:13   Mr Brain Wo Contrast  Result Date: 11/20/2016 CLINICAL DATA:  New onset lightheadedness and dizziness. Nausea, vomiting, and diarrhea. EXAM: MRI HEAD WITHOUT CONTRAST TECHNIQUE: Multiplanar, multiecho pulse sequences of the brain and surrounding structures were obtained without intravenous contrast. COMPARISON:  CT head without contrast 11/20/2016 FINDINGS: Brain: An acute/subacute nonhemorrhagic infarct is evident within the left superior cerebellum. No acute supratentorial infarct is present. Subtle T2 changes are associated with the infarct suggesting a more acute timeframe. A remote cortical infarct is present in the right occipital lobe medially. No other focal infarct is present. Mild periventricular T2 changes are within normal limits for age. The ventricles are normal size. No significant extra-axial fluid collection is present. Vascular: Flow is present in the major intracranial arteries. Skull and upper cervical  spine: A Tornwaldt cyst is present at the nasopharynx. The skullbase is otherwise within normal limits. The sella turcica is enlarged. No mass lesion is present. Midline sagittal structures are otherwise unremarkable. Degenerative changes are noted in the upper cervical spine with moderate cervical stenosis suggested at C4-5. Sinuses/Orbits: The paranasal sinuses and the mastoid air cells are clear. The globes and orbits are within normal limits. IMPRESSION: 1. Acute nonhemorrhagic left superior cerebellar infarct. 2. Normal flow is present within the major arteries of the circle of Willis. 3. Remote right occipital lobe infarct. Electronically Signed   By: San Morelle M.D.   On: 11/20/2016 08:41   Dg Chest Port 1 View  Result Date: 11/20/2016 CLINICAL DATA:  Shortness of breath. EXAM: PORTABLE CHEST 1 VIEW COMPARISON:  10/20/2016 and 05/04/2008 FINDINGS: There is cardiomegaly and aortic atherosclerosis. There is new pulmonary vascular congestion. No discrete effusions. IMPRESSION: Findings consistent with congestive heart failure with pulmonary vascular congestion and cardiomegaly. Aortic atherosclerosis. Electronically Signed   By: Lorriane Shire M.D.   On: 11/20/2016 11:34    EKG:  Sinus bradycardia at 41bpm with inferolateral ST/T wave abnormality  ASSESSMENT:PLAN:    1.  Profound bradycardia - HR 47bpm currently.  Would continue to hold rate suppressing meds.  Unfortunately this will increase her risk of recurrent afib.  Hopefully HR will improve to the point we can restart Sotolol.  Continue Eliquis.  She has not missed any doses.  CHADS2VASC score is 4.  2.  HTN - BP controlled on current meds.  3.  NIDCM - last echo with EF 20-25% a month ago. Repeat echo pending by IM.  IF EF dose not improve may need AICD  (Likely would benefit from dual chamber AICD given her potential for tachybrady syndrome).    Fransico Him, MD  11/21/2016  10:58 AM

## 2016-11-21 NOTE — Progress Notes (Addendum)
PROGRESS NOTE  HOPE BRANDENBURGER XVQ:[id number] DOB: 1951-01-30 DOA: 11/20/2016 PCP: Cathlean Cower, MD   LOS: 1 day   Brief Narrative: 65 y.o. female with medical history significant of CAD, systolic CHF, diverticulosis, HLD, morbid obesity atrial fibrillation,  who was admitted to the hospital on 12/28 with left-sided headache, dizziness, episode of nausea and vomiting. MRI on admission showed acute CVA, and neurology was consulted and she was admitted to telemetry. She was also bradycardic on admission and cardiology was consulted.   Assessment & Plan: Active Problems:   HLD (hyperlipidemia)   Essential hypertension   CKD (chronic kidney disease), stage III   Paroxysmal atrial fibrillation (HCC)   Bradycardia   Stroke (HCC)   Chronic systolic congestive heart failure (HCC)   Acute CVA  - Noted on MRI, acute nonhemorrhagic left superior cerebellar infarct  - neurology consulted, appreciate input  - 2-D echo pending, CT angiogram of the neck  - continue Eliquis  Paroxysmal A. Fib / symptomatic bradycardia - Her AV nodal agents were held, she is back in A. fib which is rate controlled currently. Cardiology consulted, appreciate input - She apparently underwent 3 cardioversions in the past in attempts to maintain sinus rhythm and she is quite symptomatic with A. fib - EP consulted - Ongoing discussions with neurology, myself, cardiology and patient regarding appropriate anticoagulation for her from now on, to make a decision on Eliquis versus Coumadin  Acute on chronic systolic CHF with acute hypoxic respiratory failure - Chest x-ray on admission yesterday showed pulmonary vascular congestion and cardiomegaly, patient is hypoxic on room air requiring supplemental oxygen, she is getting diuresed with Lasix twice daily, she appears comfortable, strict I's and O's and daily weights, wean off oxygen as tolerated - big contributor is presence of A. fib   DVT prophylaxis: Eliquis Code Status:  Full code Family Communication: no family bedside Disposition Plan: TBD  Consultants:   Cardiology  EP  Neurology   Procedures:   2D echo: pending  Antimicrobials:  None    Subjective: - no chest pain, shortness of breath, no abdominal pain, nausea or vomiting.    Objective: Vitals:   11/21/16 0514 11/21/16 0519 11/21/16 0954 11/21/16 1515  BP: 136/81  139/82 134/86  Pulse: 83   88  Resp: 18  18 19   Temp: 98.9 F (37.2 C)  98.5 F (36.9 C) 98.5 F (36.9 C)  TempSrc: Oral   Oral  SpO2: (!) 88% 97% 97% 98%  Weight:      Height:        Intake/Output Summary (Last 24 hours) at 11/21/16 1545 Last data filed at 11/20/16 2200  Gross per 24 hour  Intake                3 ml  Output                0 ml  Net                3 ml   Filed Weights   11/20/16 0522  Weight: 109.8 kg (242 lb)    Examination: Constitutional: NAD Vitals:   11/21/16 0514 11/21/16 0519 11/21/16 0954 11/21/16 1515  BP: 136/81  139/82 134/86  Pulse: 83   88  Resp: 18  18 19   Temp: 98.9 F (37.2 C)  98.5 F (36.9 C) 98.5 F (36.9 C)  TempSrc: Oral   Oral  SpO2: (!) 88% 97% 97% 98%  Weight:      Height:  Eyes: PERRL, lids and conjunctivae normal Respiratory: clear to auscultation bilaterally, no wheezing, no crackles.  Cardiovascular: Regular rate and rhythm, no murmurs / rubs / gallops.  Abdomen: no tenderness. Bowel sounds positive.  Musculoskeletal: no clubbing / cyanosis. Neurologic: CN 2-12 grossly intact. Strength 5/5 in all 4.  Psychiatric: Normal judgment and insight. Alert and oriented x 3. Normal mood.    Data Reviewed: I have personally reviewed following labs and imaging studies  CBC:  Recent Labs Lab 11/20/16 0527 11/21/16 0541  WBC 9.5 7.4  HGB 13.6 13.3  HCT 43.6 42.8  MCV 93.2 93.7  PLT 167 845   Basic Metabolic Panel:  Recent Labs Lab 11/20/16 0527 11/21/16 0541  NA 139 142  K 3.6 3.9  CL 101 100*  CO2 32 35*  GLUCOSE 177* 98  BUN 22*  14  CREATININE 1.14* 1.00  CALCIUM 8.8* 8.9   GFR: Estimated Creatinine Clearance: 71 mL/min (by C-G formula based on SCr of 1 mg/dL). Liver Function Tests:  Recent Labs Lab 11/20/16 0527  AST 65*  ALT 51  ALKPHOS 60  BILITOT 0.6  PROT 6.6  ALBUMIN 3.1*   No results for input(s): LIPASE, AMYLASE in the last 168 hours. No results for input(s): AMMONIA in the last 168 hours. Coagulation Profile:  Recent Labs Lab 11/20/16 1532  INR 1.17   Cardiac Enzymes:  Recent Labs Lab 11/20/16 0527 11/20/16 1121 11/20/16 1532 11/20/16 2222  TROPONINI <0.03 <0.03 0.03* 0.03*   BNP (last 3 results) No results for input(s): PROBNP in the last 8760 hours. HbA1C: No results for input(s): HGBA1C in the last 72 hours. CBG:  Recent Labs Lab 11/20/16 0524  GLUCAP 141*   Lipid Profile:  Recent Labs  11/21/16 0541  CHOL 144  HDL 43  LDLCALC 89  TRIG 60  CHOLHDL 3.3   Thyroid Function Tests: No results for input(s): TSH, T4TOTAL, FREET4, T3FREE, THYROIDAB in the last 72 hours. Anemia Panel: No results for input(s): VITAMINB12, FOLATE, FERRITIN, TIBC, IRON, RETICCTPCT in the last 72 hours. Urine analysis:    Component Value Date/Time   COLORURINE YELLOW 10/25/2015 [place] 10/25/2015 1658   LABSPEC 1.015 10/25/2015 1658   PHURINE 6.5 10/25/2015 1658   GLUCOSEU NEGATIVE 10/25/2015 1658   HGBUR NEGATIVE 10/25/2015 1658   BILIRUBINUR NEGATIVE 10/25/2015 1658   KETONESUR NEGATIVE 10/25/2015 1658   UROBILINOGEN 0.2 10/25/2015 1658   NITRITE POSITIVE (A) 10/25/2015 1658   LEUKOCYTESUR NEGATIVE 10/25/2015 1658   Sepsis Labs: Invalid input(s): PROCALCITONIN, LACTICIDVEN  No results found for this or any previous visit (from the past 240 hour(s)).    Radiology Studies: Ct Angio Head W/cm &/or Wo Cm  Result Date: 11/20/2016 CLINICAL DATA:  Headache at the level the left eye beginning today. Dizziness. Stroke. Left superior cerebellar infarct. EXAM: CT  ANGIOGRAPHY HEAD AND NECK TECHNIQUE: Multidetector CT imaging of the head and neck was performed using the standard protocol during bolus administration of intravenous contrast. Multiplanar CT image reconstructions and MIPs were obtained to evaluate the vascular anatomy. Carotid stenosis measurements (when applicable) are obtained utilizing NASCET criteria, using the distal internal carotid diameter as the denominator. CONTRAST:  50 mL Isovue 370 COMPARISON:  MRI of the brain from the same day. FINDINGS: CTA NECK FINDINGS Aortic arch: Study is somewhat degraded by patient motion. Atherosclerotic calcifications are present at the aortic arch without significant stenosis. Right carotid system: The right common carotid artery demonstrates mild tortuosity without significant stenosis. The bifurcation is  unremarkable. There is mild tortuosity within the cervical right ICA is well. No focal stenosis present. Left carotid system: The left common carotid artery is within normal limits. Calcifications are present at the left carotid bifurcation without significant stenosis. The cervical left ICA is unremarkable. Vertebral arteries: The right vertebral artery originates from the subclavian artery. The origin is obscured by artifact. The left vertebral artery is not visualized proximally is likely occluded. It is reconstituted at the C2 level via musculoskeletal branches. There is no significant focal stenosis of the right vertebral artery in the neck. Skeleton: Multilevel endplate changes are present throughout cervical spine. Uncovertebral spurring contributes to osseous foraminal narrowing most evident at C4-5 and C5-6, left greater than right. Other neck: The soft tissues the neck are unremarkable. No significant adenopathy is present. Upper chest: The lung apices demonstrate mild dependent atelectasis. No focal nodule or mass lesion is present. The superior mediastinum is otherwise unremarkable. The thoracic inlet is  within normal limits. Review of the MIP images confirms the above findings CTA HEAD FINDINGS Anterior circulation: Atherosclerotic calcifications are present within the cavernous internal carotid arteries bilaterally without a significant stenosis through the ICA termini bilaterally. The A1 and M1 segments are normal. The MCA bifurcations are intact bilaterally. The anterior communicating artery is patent. Mild distal small vessel disease is present without a significant proximal stenosis or occlusion. Posterior circulation: The right vertebral artery is dominant to left. Calcifications are present at dural margin of the right vertebral artery without a significant stenosis. The vertebrobasilar junction is intact. The PICA origins are visualized and normal. The left superior cerebellar artery is occluded, consistent with the area of infarction. The left PICA is engorged. This likely represents luxury perfusion. Both posterior cerebral arteries originate from the basilar tip. Both posterior cerebral arteries originate from the basilar tip. The PCA branch vessels are within normal limits bilaterally. Venous sinuses: The dural sinuses are patent. The right transverse sinus is dominant. The straight sinus and deep cerebral veins are intact. Cortical veins are unremarkable. Anatomic variants: None Delayed phase: The postcontrast images demonstrate no pathologic enhancement. The left superior cerebellar infarct is again noted. Review of the MIP images confirms the above findings IMPRESSION: 1. Left superior cerebellar nonhemorrhagic infarct. 2. Occlusion of the left superior cerebellar artery. 3. Mild engorgement of the left posterior inferior cerebellar artery likely representing luxury perfusion. 4. Atherosclerotic calcifications at the dural margin of the right vertebral artery in both cavernous internal carotid arteries without significant stenosis. 5. Mild atherosclerotic changes of the left carotid bifurcation and  at the aortic arch without significant stenosis. 6. Multilevel spondylosis of the cervical spine is most evident at C4-5 and C5-6. Electronically Signed   By: San Morelle M.D.   On: 11/20/2016 12:13   Ct Head Wo Contrast  Result Date: 11/20/2016 CLINICAL DATA:  Headache behind left eye.  Dizziness. EXAM: CT HEAD WITHOUT CONTRAST TECHNIQUE: Contiguous axial images were obtained from the base of the skull through the vertex without intravenous contrast. COMPARISON:  None. FINDINGS: Brain: No mass lesion, intraparenchymal hemorrhage or extra-axial collection. No evidence of acute cortical infarct. Brain parenchyma and CSF-containing spaces are normal for age. Vascular: Atherosclerotic calcification of the vertebral and internal carotid arteries at the skullbase. Skull: Normal visualized skull base, calvarium and extracranial soft tissues. Sinuses/Orbits: No sinus fluid levels or advanced mucosal thickening. No mastoid effusion. Normal orbits. IMPRESSION: No acute intracranial abnormality. Electronically Signed   By: Ulyses Jarred M.D.   On: 11/20/2016 06:21  Ct Angio Neck W/cm &/or Wo/cm  Result Date: 11/20/2016 CLINICAL DATA:  Headache at the level the left eye beginning today. Dizziness. Stroke. Left superior cerebellar infarct. EXAM: CT ANGIOGRAPHY HEAD AND NECK TECHNIQUE: Multidetector CT imaging of the head and neck was performed using the standard protocol during bolus administration of intravenous contrast. Multiplanar CT image reconstructions and MIPs were obtained to evaluate the vascular anatomy. Carotid stenosis measurements (when applicable) are obtained utilizing NASCET criteria, using the distal internal carotid diameter as the denominator. CONTRAST:  50 mL Isovue 370 COMPARISON:  MRI of the brain from the same day. FINDINGS: CTA NECK FINDINGS Aortic arch: Study is somewhat degraded by patient motion. Atherosclerotic calcifications are present at the aortic arch without significant  stenosis. Right carotid system: The right common carotid artery demonstrates mild tortuosity without significant stenosis. The bifurcation is unremarkable. There is mild tortuosity within the cervical right ICA is well. No focal stenosis present. Left carotid system: The left common carotid artery is within normal limits. Calcifications are present at the left carotid bifurcation without significant stenosis. The cervical left ICA is unremarkable. Vertebral arteries: The right vertebral artery originates from the subclavian artery. The origin is obscured by artifact. The left vertebral artery is not visualized proximally is likely occluded. It is reconstituted at the C2 level via musculoskeletal branches. There is no significant focal stenosis of the right vertebral artery in the neck. Skeleton: Multilevel endplate changes are present throughout cervical spine. Uncovertebral spurring contributes to osseous foraminal narrowing most evident at C4-5 and C5-6, left greater than right. Other neck: The soft tissues the neck are unremarkable. No significant adenopathy is present. Upper chest: The lung apices demonstrate mild dependent atelectasis. No focal nodule or mass lesion is present. The superior mediastinum is otherwise unremarkable. The thoracic inlet is within normal limits. Review of the MIP images confirms the above findings CTA HEAD FINDINGS Anterior circulation: Atherosclerotic calcifications are present within the cavernous internal carotid arteries bilaterally without a significant stenosis through the ICA termini bilaterally. The A1 and M1 segments are normal. The MCA bifurcations are intact bilaterally. The anterior communicating artery is patent. Mild distal small vessel disease is present without a significant proximal stenosis or occlusion. Posterior circulation: The right vertebral artery is dominant to left. Calcifications are present at dural margin of the right vertebral artery without a significant  stenosis. The vertebrobasilar junction is intact. The PICA origins are visualized and normal. The left superior cerebellar artery is occluded, consistent with the area of infarction. The left PICA is engorged. This likely represents luxury perfusion. Both posterior cerebral arteries originate from the basilar tip. Both posterior cerebral arteries originate from the basilar tip. The PCA branch vessels are within normal limits bilaterally. Venous sinuses: The dural sinuses are patent. The right transverse sinus is dominant. The straight sinus and deep cerebral veins are intact. Cortical veins are unremarkable. Anatomic variants: None Delayed phase: The postcontrast images demonstrate no pathologic enhancement. The left superior cerebellar infarct is again noted. Review of the MIP images confirms the above findings IMPRESSION: 1. Left superior cerebellar nonhemorrhagic infarct. 2. Occlusion of the left superior cerebellar artery. 3. Mild engorgement of the left posterior inferior cerebellar artery likely representing luxury perfusion. 4. Atherosclerotic calcifications at the dural margin of the right vertebral artery in both cavernous internal carotid arteries without significant stenosis. 5. Mild atherosclerotic changes of the left carotid bifurcation and at the aortic arch without significant stenosis. 6. Multilevel spondylosis of the cervical spine is most evident at  C4-5 and C5-6. Electronically Signed   By: San Morelle M.D.   On: 11/20/2016 12:13   Mr Brain Wo Contrast  Result Date: 11/20/2016 CLINICAL DATA:  New onset lightheadedness and dizziness. Nausea, vomiting, and diarrhea. EXAM: MRI HEAD WITHOUT CONTRAST TECHNIQUE: Multiplanar, multiecho pulse sequences of the brain and surrounding structures were obtained without intravenous contrast. COMPARISON:  CT head without contrast 11/20/2016 FINDINGS: Brain: An acute/subacute nonhemorrhagic infarct is evident within the left superior cerebellum. No  acute supratentorial infarct is present. Subtle T2 changes are associated with the infarct suggesting a more acute timeframe. A remote cortical infarct is present in the right occipital lobe medially. No other focal infarct is present. Mild periventricular T2 changes are within normal limits for age. The ventricles are normal size. No significant extra-axial fluid collection is present. Vascular: Flow is present in the major intracranial arteries. 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 The skullbase is otherwise within normal limits. The sella turcica is enlarged. No mass lesion is present. Midline sagittal structures are otherwise unremarkable. Degenerative changes are noted in the upper cervical spine with moderate cervical stenosis suggested at C4-5. Sinuses/Orbits: The paranasal sinuses and the mastoid air cells are clear. The globes and orbits are within normal limits. IMPRESSION: 1. Acute nonhemorrhagic left superior cerebellar infarct. 2. Normal flow is present within the major arteries of the circle of Willis. 3. Remote right occipital lobe infarct. Electronically Signed   By: San Morelle M.D.   On: 11/20/2016 08:41   Dg Chest Port 1 View  Result Date: 11/21/2016 CLINICAL DATA:  Patient with shortness of breath. EXAM: PORTABLE CHEST 1 VIEW COMPARISON:  Chest radiograph 11/20/2016. FINDINGS: Monitoring leads overlie the patient. Stable cardiomegaly. Pulmonary vascular redistribution. Consolidation within the retrocardiac location. Possible small left pleural effusion. IMPRESSION: Cardiomegaly. Consolidation within the retrocardiac location favored to be secondary to atelectasis. Possible small left effusion. Electronically Signed   By: Lovey Newcomer M.D.   On: 11/21/2016 15:40   Dg Chest Port 1 View  Result Date: 11/20/2016 CLINICAL DATA:  Shortness of breath. EXAM: PORTABLE CHEST 1 VIEW COMPARISON:  10/20/2016 and 05/04/2008 FINDINGS: There is  cardiomegaly and aortic atherosclerosis. There is new pulmonary vascular congestion. No discrete effusions. IMPRESSION: Findings consistent with congestive heart failure with pulmonary vascular congestion and cardiomegaly. Aortic atherosclerosis. Electronically Signed   By: Lorriane Shire M.D.   On: 11/20/2016 11:34     Scheduled Meds: .  stroke: mapping our early stages of recovery book   Does not apply Once  . apixaban  5 mg Oral BID  . aspirin  300 mg Rectal Daily   Or  . aspirin  325 mg Oral Daily  . furosemide  40 mg Intravenous BID  . magnesium oxide  400 mg Oral Daily  . mouth rinse  15 mL Mouth Rinse BID  . rosuvastatin  10 mg Oral QHS  . sodium chloride flush  3 mL Intravenous Q12H   Continuous Infusions:    Marzetta Board, MD, PhD [facility] Pager [phone]  If 7PM-7AM, please contact night-coverage [email or web address] Password [facility] 11/21/2016, 3:45 PM

## 2016-11-21 NOTE — Evaluation (Signed)
Physical Therapy Evaluation Patient Details Name: Patricia Wagner MRN: [id number] DOB: 04/09/1951 Today's Date: 11/21/2016   History of Present Illness  65 yo female admitted with L facial droop and slurred speech MRI (+) L superior cerebellar hemisphere ischemic infarct . Pt with recent admission for AFib RVR and under went TEE and x2 cardioversion ( 12/11 and 12/26)  Clinical Impression  Patient demonstrates deficits in functional mobility as indicated below. Will need continued skilled PT to address deficits and maximize function. Will see as indicated and progress as tolerated. Patient was independent prior to admission. Feel patient will need comprehensive therapies upon acute discharge. Recommend CIR.    Follow Up Recommendations CIR    Equipment Recommendations  Rolling walker with 5" wheels;3in1 (PT)    Recommendations for Other Services       Precautions / Restrictions Precautions Precautions: Fall      Mobility  Bed Mobility Overal bed mobility: Needs Assistance Bed Mobility: Supine to Sit     Supine to sit: Supervision     General bed mobility comments: no physical assist required, increased time and effort to perform. Reliance on side rail noted.   Transfers Overall transfer level: Needs assistance Equipment used: Rolling walker (2 wheeled) Transfers: Sit to/from Stand Sit to Stand: Min assist;From elevated surface         General transfer comment: Min assist to power up to standing using RW, cues for modified hand placement. Patient with increased anxiety about coming to upright, feels like she is "falling over" Heavy reliance on UE support to come erect. Performed from commode over toilet using grab bar with continued min guard for safety to power up.  Ambulation/Gait Ambulation/Gait assistance: Min guard;Min assist Ambulation Distance (Feet): 18 Feet Assistive device: Rolling walker (2 wheeled) Gait Pattern/deviations: Step-through pattern;Decreased  stride length;Shuffle;Wide base of support;Trunk flexed Gait velocity: decreased Gait velocity interpretation: Below normal speed for age/gender General Gait Details: some instability noted in ambulation, heavy reliance on RW for stability, poor awareness   Stairs            Wheelchair Mobility    Modified Rankin (Stroke Patients Only) Modified Rankin (Stroke Patients Only) Pre-Morbid Rankin Score: No symptoms Modified Rankin: Moderately severe disability     Balance Overall balance assessment: Needs assistance Sitting-balance support: Feet supported Sitting balance-Leahy Scale: Fair     Standing balance support: Single extremity supported;During functional activity Standing balance-Leahy Scale: Fair Standing balance comment: reliance on single UE support in static standing but was able to perform pericare with min guard                             Pertinent Vitals/Pain Pain Assessment: No/denies pain    Home Living Family/patient expects to be discharged to:: Private residence Living Arrangements: Alone Available Help at Discharge: Family Type of Home: Apartment Home Access: Level entry     Home Layout: One level Home Equipment: None      Prior Function Level of Independence: Independent               Hand Dominance   Dominant Hand: Right    Extremity/Trunk Assessment   Upper Extremity Assessment Upper Extremity Assessment: Overall WFL for tasks assessed    Lower Extremity Assessment Lower Extremity Assessment: Overall WFL for tasks assessed    Cervical / Trunk Assessment Cervical / Trunk Assessment: Normal  Communication   Communication: No difficulties  Cognition Arousal/Alertness: Awake/alert Behavior During Therapy:  Anxious Overall Cognitive Status: No family/caregiver present to determine baseline cognitive functioning                      General Comments      Exercises     Assessment/Plan    PT  Assessment Patient needs continued PT services  PT Problem List Decreased strength;Decreased activity tolerance;Decreased balance;Decreased mobility;Decreased coordination;Decreased safety awareness;Obesity          PT Treatment Interventions DME instruction;Gait training;Functional mobility training;Therapeutic activities;Therapeutic exercise;Balance training;Patient/family education    PT Goals (Current goals can be found in the Care Plan section)  Acute Rehab PT Goals Patient Stated Goal: to spend time with grandbaby that is 2. pt states "she is my heart" PT Goal Formulation: With patient Time For Goal Achievement: 12/05/16 Potential to Achieve Goals: Good    Frequency Min 3X/week   Barriers to discharge        Co-evaluation               End of Session Equipment Utilized During Treatment: Gait belt Activity Tolerance: Patient tolerated treatment well Patient left: in chair;with call bell/phone within reach (chair alarm pad but no alarm box avail nsg aware) Nurse Communication: Mobility status         Time: 6825-7493 PT Time Calculation (min) (ACUTE ONLY): 26 min   Charges:   PT Evaluation $PT Eval Moderate Complexity: 1 Procedure PT Treatments $Therapeutic Activity: 8-22 mins   PT G Codes:        Duncan Dull 2016-12-02, 11:29 AM Alben Deeds, PT DPT  [phone]

## 2016-11-21 NOTE — Consult Note (Signed)
ELECTROPHYSIOLOGY CONSULT NOTE    Patient ID: Patricia Wagner MRN: [id number], DOB/AGE: 04-28-1951 65 y.o.  Admit date: 11/20/2016 Date of Consult: 11/21/2016  Primary Physician: Cathlean Cower, MD Primary Cardiologist: [name] Electrophysiologist: Allred Referring MD: Radford Pax   Reason for Consultation: atrial fibrillation   HPI:  Patricia Wagner is a 65 y.o. female with a past medical history significant for non ischemic cardiomyopathy, hyperlipidemia, hypertension, and persistent atrial fibrillation.  She underwent cardioversion on 11/03/16 and was back in atrial flutter with RVR at AF clinic visit 11/06/16, but unaware of palpitations or change in symptoms. She then developed progressive shortness of breath and underwent repeat DCCV 11/18/16.  She then presented to the ER on 12/27 with complaints of left eye pain and left sided headache. Imaging demonstrated acute nonhemorrhagic left superior cerebellar infarct.  She was also significantly bradycardic on arrival with sinus rates in the 40's. Her Sotalol, BB, and digoxin have been held with return to rate controlled atrial fibrillation.  She was on Eliquis at home and reports compliance for last month without missed doses.    EP has been asked to evaluate for treatment options for recurrent atrial fibrillation as well as bradycardia.   She currently feels that she is recovering from her stroke. She has no awareness that she has returned to atrial fibrillation.  Her V rates are controlled. She denies chest pain, shortness of breath, LE edema, dizziness, syncope.   Past Medical History:  Diagnosis Date  . Arthritis   . CAD in native artery, s/p cardiac cath with non obstructive CAD 10/24/2016  . CHF (congestive heart failure) ([place])   . DIVERTICULITIS, HX OF 07/25/2007  . DIVERTICULOSIS, COLON 07/22/2007  . Dyspnea   . Edema, peripheral    a. chronic BLE edema, R>L. Prior trauma from dog attack and accident.  Marland Kitchen HYPERLIPIDEMIA 02/03/2008  .  Hypersomnia    declines w/u  . Hypertension   . MENOPAUSAL DISORDER 01/09/2011  . Morbid obesity ([place]) 07/22/2007  . NICM (nonischemic cardiomyopathy) ([place]) 10/24/2016  . Raynaud's syndrome 07/22/2007  . THYROID NODULE, RIGHT 01/04/2010  . VITAMIN D DEFICIENCY 01/09/2011   Qualifier: Diagnosis of  By: [name], [name]      Surgical History:  Past Surgical History:  Procedure Laterality Date  . CARDIAC CATHETERIZATION N/A 10/23/2016   Procedure: Left Heart Cath and Coronary Angiography;  Surgeon: [name], MD;  Location: [place] [facility];  Service: Cardiovascular;  Laterality: N/A;  . CARDIOVERSION N/A 10/31/2016   Procedure: CARDIOVERSION;  Surgeon: [name], MD;  Location: [facility] ENDOSCOPY;  Service: Cardiovascular;  Laterality: N/A;  . CARDIOVERSION N/A 11/03/2016   Procedure: CARDIOVERSION;  Surgeon: [name], MD;  Location: [facility] ENDOSCOPY;  Service: Cardiovascular;  Laterality: N/A;  . CARDIOVERSION N/A 11/18/2016   Procedure: CARDIOVERSION;  Surgeon: [name], MD;  Location: [facility] ENDOSCOPY;  Service: Cardiovascular;  Laterality: N/A;  . CHOLECYSTECTOMY    . COLONOSCOPY    . PARTIAL HYSTERECTOMY     1 OVARY LEFT  . POLYPECTOMY    . TEE WITHOUT CARDIOVERSION N/A 10/31/2016   Procedure: TRANSESOPHAGEAL ECHOCARDIOGRAM (TEE);  Surgeon: [name], MD;  Location: [facility] ENDOSCOPY;  Service: Cardiovascular;  Laterality: N/A;     Prescriptions Prior to Admission  Medication Sig Dispense Refill Last Dose  . acetaminophen (TYLENOL) 325 MG tablet Take 2 tablets (650 mg total) by mouth every 4 (four) hours as needed for headache or mild pain. (Patient taking differently: Take 325  mg by mouth every 4 (four) hours as needed for headache or mild pain. )   unk at unk  . apixaban (ELIQUIS) 5 MG TABS tablet Take 1 tablet (5 mg total) by mouth 2 (two) times daily. 60 tablet 6 11/19/2016 at 1900  . digoxin (LANOXIN) 0.125 MG tablet Take 1 tablet (0.125 mg total) by mouth daily. 30  tablet 6 11/19/2016 at Unknown time  . furosemide (LASIX) 40 MG tablet Take 1.5 tablets (60 mg total) by mouth daily. 30 tablet 1 11/19/2016 at Unknown time  . losartan (COZAAR) 25 MG tablet Take 1 tablet (25 mg total) by mouth daily. 30 tablet 1 11/19/2016 at Unknown time  . magnesium oxide (MAG-OX) 400 MG tablet Take 400 mg by mouth daily.   11/19/2016 at Unknown time  . metoprolol (LOPRESSOR) 50 MG tablet Take 1 tablet (50 mg total) by mouth 2 (two) times daily. 60 tablet 6 11/19/2016 at 1900  . potassium chloride SA (K-DUR,KLOR-CON) 20 MEQ tablet Take 1 tablet (20 mEq total) by mouth daily. 30 tablet 1 11/19/2016 at Unknown time  . sotalol (BETAPACE) 120 MG tablet Take 1 tablet (120 mg total) by mouth every 12 (twelve) hours. 60 tablet 1 11/19/2016 at Unknown time  . Magnesium 250 MG TABS Take 1 tablet (250 mg total) by mouth daily. (Patient not taking: Reported on 11/20/2016)  0 Not Taking at Unknown time  . rosuvastatin (CRESTOR) 10 MG tablet Take 1 tablet (10 mg total) by mouth daily. 90 tablet 3 unk at unk    Inpatient Medications:  .  stroke: mapping our early stages of recovery book   Does not apply Once  . apixaban  5 mg Oral BID  . aspirin  300 mg Rectal Daily   Or  . aspirin  325 mg Oral Daily  . furosemide  40 mg Intravenous BID  . magnesium oxide  400 mg Oral Daily  . mouth rinse  15 mL Mouth Rinse BID  . rosuvastatin  10 mg Oral QHS  . sodium chloride flush  3 mL Intravenous Q12H    Allergies:  Allergies  Allergen Reactions  . Ace Inhibitors Palpitations    Social History   Social History  . Marital status: Single    Spouse name: N/A  . Number of children: 1  . Years of education: N/A   Occupational History  . Printing     Social History Main Topics  . Smoking status: Former Smoker    Packs/day: 0.50    Types: Cigarettes    Quit date: 10/23/2016  . Smokeless tobacco: Never Used     Comment: Smoked for 50 years  . Alcohol use 2.4 oz/week    4 Cans of  beer per week     Comment: Intermittent  . Drug use: No  . Sexual activity: Not on file   Other Topics Concern  . Not on file   Social History Narrative  . No narrative on file     Family History  Problem Relation Age of Onset  . Asthma Mother   . Diabetes Father   . Heart disease Father     Died of presumed heart attack - 80s  . Lung disease Sister   . Heart disease Sister     Twin sister has heart issue, unclear what kind  . Thyroid disease Neg Hx      Review of Systems: All other systems reviewed and are otherwise negative except as noted above.  Physical Exam: Vitals:  11/21/16 0308 11/21/16 0514 11/21/16 0519 11/21/16 0954  BP: 136/68 136/81  139/82  Pulse: 76 83    Resp: 18 18  18   Temp:  98.9 F (37.2 C)  98.5 F (36.9 C)  TempSrc:  Oral    SpO2: 95% (!) 88% 97% 97%  Weight:      Height:        GEN- The patient is elderly appearing, alert and oriented x 3 today.   HEENT: normocephalic, atraumatic; sclera clear, conjunctiva pink; hearing intact; oropharynx clear; neck supple Lungs- Clear to ausculation bilaterally, normal work of breathing.  No wheezes, rales, rhonchi Heart- Irregular rate and rhythm GI- soft, non-tender, non-distended, bowel sounds present Extremities- no clubbing, cyanosis, or edema; DP/PT/radial pulses 2+ bilaterally MS- no significant deformity or atrophy Skin- warm and dry, no rash or lesion Psych- euthymic mood, full affect Neuro- strength and sensation are intact  Labs:   Lab Results  Component Value Date   WBC 7.4 11/21/2016   HGB 13.3 11/21/2016   HCT 42.8 11/21/2016   MCV 93.7 11/21/2016   PLT 160 11/21/2016     Recent Labs Lab 11/20/16 0527 11/21/16 0541  NA 139 142  K 3.6 3.9  CL 101 100*  CO2 32 35*  BUN 22* 14  CREATININE 1.14* 1.00  CALCIUM 8.8* 8.9  PROT 6.6  --   BILITOT 0.6  --   ALKPHOS 60  --   ALT 51  --   AST 65*  --   GLUCOSE 177* 98      Radiology/Studies: Ct Angio Head W/cm &/or Wo Cm  Result Date: 11/20/2016 CLINICAL DATA:  Headache at the level the left eye beginning today. Dizziness. Stroke. Left superior cerebellar infarct. EXAM: CT ANGIOGRAPHY HEAD AND NECK TECHNIQUE: Multidetector CT imaging of the head and neck was performed using the standard protocol during bolus administration of intravenous contrast. Multiplanar CT image reconstructions and MIPs were obtained to evaluate the vascular anatomy. Carotid stenosis measurements (when applicable) are obtained utilizing NASCET criteria, using the distal internal carotid diameter as the denominator. CONTRAST:  50 mL Isovue 370 COMPARISON:  MRI of the brain from the same day. FINDINGS: CTA NECK FINDINGS Aortic arch: Study is somewhat degraded by patient motion. Atherosclerotic calcifications are present at the aortic arch without significant stenosis. Right carotid system: The right common carotid artery demonstrates mild tortuosity without significant stenosis. The bifurcation is unremarkable. There is mild tortuosity within the cervical right ICA is well. No focal stenosis present. Left carotid system: The left common carotid artery is within normal limits. Calcifications are present at the left carotid bifurcation without significant stenosis. The cervical left ICA is unremarkable. Vertebral arteries: The right vertebral artery originates from the subclavian artery. The origin is obscured by artifact. The left vertebral artery is not visualized proximally is likely occluded. It is reconstituted at the C2 level via musculoskeletal branches. There is no significant focal stenosis of the right vertebral artery in the neck. Skeleton: Multilevel endplate changes are present throughout cervical spine. Uncovertebral spurring contributes to osseous foraminal narrowing most evident at C4-5 and C5-6, left greater than right. Other neck: The soft tissues the neck are unremarkable. No significant adenopathy is present. Upper chest: The lung apices  demonstrate mild dependent atelectasis. No focal nodule or mass lesion is present. The superior mediastinum is otherwise unremarkable. The thoracic inlet is within normal limits. Review of the MIP images confirms the above findings CTA HEAD FINDINGS Anterior circulation: Atherosclerotic calcifications are present within  the cavernous internal carotid arteries bilaterally without a significant stenosis through the ICA termini bilaterally. The A1 and M1 segments are normal. The MCA bifurcations are intact bilaterally. The anterior communicating artery is patent. Mild distal small vessel disease is present without a significant proximal stenosis or occlusion. Posterior circulation: The right vertebral artery is dominant to left. Calcifications are present at dural margin of the right vertebral artery without a significant stenosis. The vertebrobasilar junction is intact. The PICA origins are visualized and normal. The left superior cerebellar artery is occluded, consistent with the area of infarction. The left PICA is engorged. This likely represents luxury perfusion. Both posterior cerebral arteries originate from the basilar tip. Both posterior cerebral arteries originate from the basilar tip. The PCA branch vessels are within normal limits bilaterally. Venous sinuses: The dural sinuses are patent. The right transverse sinus is dominant. The straight sinus and deep cerebral veins are intact. Cortical veins are unremarkable. Anatomic variants: None Delayed phase: The postcontrast images demonstrate no pathologic enhancement. The left superior cerebellar infarct is again noted. Review of the MIP images confirms the above findings IMPRESSION: 1. Left superior cerebellar nonhemorrhagic infarct. 2. Occlusion of the left superior cerebellar artery. 3. Mild engorgement of the left posterior inferior cerebellar artery likely representing luxury perfusion. 4. Atherosclerotic calcifications at the dural margin of the right  vertebral artery in both cavernous internal carotid arteries without significant stenosis. 5. Mild atherosclerotic changes of the left carotid bifurcation and at the aortic arch without significant stenosis. 6. Multilevel spondylosis of the cervical spine is most evident at C4-5 and C5-6. Electronically Signed   By: San Morelle M.D.   On: 11/20/2016 12:13   Mr Brain Wo Contrast Result Date: 11/20/2016 CLINICAL DATA:  New onset lightheadedness and dizziness. Nausea, vomiting, and diarrhea. EXAM: MRI HEAD WITHOUT CONTRAST TECHNIQUE: Multiplanar, multiecho pulse sequences of the brain and surrounding structures were obtained without intravenous contrast. COMPARISON:  CT head without contrast 11/20/2016 FINDINGS: Brain: An acute/subacute nonhemorrhagic infarct is evident within the left superior cerebellum. No acute supratentorial infarct is present. Subtle T2 changes are associated with the infarct suggesting a more acute timeframe. A remote cortical infarct is present in the right occipital lobe medially. No other focal infarct is present. Mild periventricular T2 changes are within normal limits for age. The ventricles are normal size. No significant extra-axial fluid collection is present. Vascular: Flow is present in the major intracranial arteries. 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 The skullbase is otherwise within normal limits. The sella turcica is enlarged. No mass lesion is present. Midline sagittal structures are otherwise unremarkable. Degenerative changes are noted in the upper cervical spine with moderate cervical stenosis suggested at C4-5. Sinuses/Orbits: The paranasal sinuses and the mastoid air cells are clear. The globes and orbits are within normal limits. IMPRESSION: 1. Acute nonhemorrhagic left superior cerebellar infarct. 2. Normal flow is present within the major arteries of the circle of Willis. 3. Remote right occipital lobe infarct.  Electronically Signed   By: San Morelle M.D.   On: 11/20/2016 08:41   Dg Chest Port 1 View Result Date: 11/20/2016 CLINICAL DATA:  Shortness of breath. EXAM: PORTABLE CHEST 1 VIEW COMPARISON:  10/20/2016 and 05/04/2008 FINDINGS: There is cardiomegaly and aortic atherosclerosis. There is new pulmonary vascular congestion. No discrete effusions. IMPRESSION: Findings consistent with congestive heart failure with pulmonary vascular congestion and cardiomegaly. Aortic atherosclerosis. Electronically Signed   By: Lorriane Shire M.D.   On: 11/20/2016  11:34    EKG: sinus brady, rate 41  TELEMETRY: rate controlled atrial fibrillation  Assessment/Plan: 1.  Persistent atrial fibrillation The patient has recurrent persistent atrial fibrillation after presenting with acute stroke and bradycardia.  Home medications included Sotalol 120mg  twice daily, Metropolol 50mg  twice daily, Digoxin, and Eliquis.  Sotalol, metoprolol and digoxin have been held with return to rate controlled atrial fibrillation. She is currenlty asymptomatic, but she developed worsening heart failure symptoms in the past with prolonged atrial fibrillation With acute stroke, would leave in rate controlled atrial fibrillation for now She reports compliance with Eliquis and so would change anticoagulation.  For now, we have discussed going on Warfarin. She agrees.  Will let neurology help guide transition Can add back low dose BB as needed for V rates >100 Will arrange close outpatient follow up in the AF clinic [place] is 5  2.  Acute on chronic systolic heart failure Continue medical therapy  Resume ACE-I/BB when able per neurology  Continue diuresis per cardiology team Would optimize medical therapy and try to restore SR again with repeat EF after 3 months in SR prior to deciding about need for ICD for primary prevention  3.  Obesity Body mass index is 40.27 kg/m. Weight loss encouraged  4.  HTN Stable No change  required today  Dr Lovena Le to see later today  Will arrange close outpatient follow up with AF clinic    Signed, Chanetta Marshall, NP 11/21/2016 12:51 PM  EP Attending   Patient seen and examined. Agree with above. The patient present with a stroke after DCCV. She denies non-compliance and has reverted back to atrial fib. Her rate was slow but is now controlled. She has been on an extensive list of both sinus and AV nodal blocking agents. I have discussed the treatment options. Because we cannot be for sure that she was taking her anti-coagulation, I would suggest treating her in the short term with warfarin so that her INR's can be confirmed. DCCV after 3-4 weeks of anti-coagulation. With regard to her atrial fib, she should be kept rate controlled but she will need a reduction in her drugs.  Her EF is down and that will need to be recheck once her HR's have been controlled. I suspect she will ultimately need amiodarone to maintain her in NSR.   Cristopher Peru, M.D.

## 2016-11-21 NOTE — Consult Note (Signed)
Physical Medicine and Rehabilitation Consult Reason for Consult: Acute nonhemorrhagic left superior cerebellar infarct Referring Physician: [facility]   HPI: Patricia Wagner is a 65 y.o. right handed female with history of CAD, systolic congestive heart failure, hypertension, morbid obesity, tobacco abuse. Per chart review patient lives with grandaughter and independent prior to admission. Present 11/20/2016 with dizziness and headache with associated nausea and vomiting as well as intermittent shortness of breath. Denied any blurred vision. Cranial CT scan negative. MRI showed acute nonhemorrhagic left superior cerebellar infarct as well as remote right occipital lobe infarct. CT angio head and neck showed occlusion of the left superior cerebellar artery. Patient did not receive TPA. Echocardiogram pending. A recent TEE for workup of atrial fibrillation as an outpatient per cardiology service showed LVEF severely depressed 20% no evidence of thrombus. Patient did undergo a recent cardioversion 11/03/2016. Cardiology consulted for profound bradycardia 47 bpm and await plan for possible dual-chamber AICD. Presently maintained on Eliquis and aspirin therapy. Physical occupational therapy evaluation completed with recommendations of physical medicine rehabilitation consult.   Review of Systems  Constitutional: Negative for chills and fever.  HENT: Negative for hearing loss and tinnitus.   Eyes: Negative for blurred vision and double vision.  Respiratory: Positive for shortness of breath. Negative for cough.   Cardiovascular: Positive for leg swelling. Negative for chest pain and palpitations.  Gastrointestinal: Positive for constipation, nausea and vomiting.  Genitourinary: Negative for dysuria and hematuria.  Musculoskeletal: Positive for joint pain and myalgias.  Skin: Negative for rash.  Neurological: Positive for weakness. Negative for seizures.  All other systems reviewed and are  negative.  Past Medical History:  Diagnosis Date  . Arthritis   . CAD in native artery, s/p cardiac cath with non obstructive CAD 10/24/2016  . CHF (congestive heart failure) ([place])   . DIVERTICULITIS, HX OF 07/25/2007  . DIVERTICULOSIS, COLON 07/22/2007  . Dyspnea   . Edema, peripheral    a. chronic BLE edema, R>L. Prior trauma from dog attack and accident.  Marland Kitchen HYPERLIPIDEMIA 02/03/2008  . Hypersomnia    declines w/u  . Hypertension   . MENOPAUSAL DISORDER 01/09/2011  . Morbid obesity ([place]) 07/22/2007  . NICM (nonischemic cardiomyopathy) ([place]) 10/24/2016  . Raynaud's syndrome 07/22/2007  . THYROID NODULE, RIGHT 01/04/2010  . VITAMIN D DEFICIENCY 01/09/2011   Qualifier: Diagnosis of  By: [name], [name]    Past Surgical History:  Procedure Laterality Date  . CARDIAC CATHETERIZATION N/A 10/23/2016   Procedure: Left Heart Cath and Coronary Angiography;  Surgeon: [name], MD;  Location: [facility];  Service: Cardiovascular;  Laterality: N/A;  . CARDIOVERSION N/A 10/31/2016   Procedure: CARDIOVERSION;  Surgeon: [name], MD;  Location: [facility] ENDOSCOPY;  Service: Cardiovascular;  Laterality: N/A;  . CARDIOVERSION N/A 11/03/2016   Procedure: CARDIOVERSION;  Surgeon: [name], MD;  Location: [facility] ENDOSCOPY;  Service: Cardiovascular;  Laterality: N/A;  . CARDIOVERSION N/A 11/18/2016   Procedure: CARDIOVERSION;  Surgeon: [name], MD;  Location: [facility] ENDOSCOPY;  Service: Cardiovascular;  Laterality: N/A;  . CHOLECYSTECTOMY    . COLONOSCOPY    . PARTIAL HYSTERECTOMY     1 OVARY LEFT  . POLYPECTOMY    . TEE WITHOUT CARDIOVERSION N/A 10/31/2016   Procedure: TRANSESOPHAGEAL ECHOCARDIOGRAM (TEE);  Surgeon: [name], MD;  Location: [facility] ENDOSCOPY;  Service: Cardiovascular;  Laterality: N/A;   Family History  Problem Relation Age of Onset  . Asthma Mother   .  Diabetes Father   . Heart disease Father     Died of presumed heart attack - 75s  . Lung disease Sister   .  Heart disease Sister     Twin sister has heart issue, unclear what kind  . Thyroid disease Neg Hx    Social History:  reports that she quit smoking about 4 weeks ago. Her smoking use included Cigarettes. She smoked 0.50 packs per day. She has never used smokeless tobacco. She reports that she drinks about 2.4 oz of alcohol per week . She reports that she does not use drugs. Allergies:  Allergies  Allergen Reactions  . Ace Inhibitors Palpitations   Medications Prior to Admission  Medication Sig Dispense Refill  . acetaminophen (TYLENOL) 325 MG tablet Take 2 tablets (650 mg total) by mouth every 4 (four) hours as needed for headache or mild pain. (Patient taking differently: Take 325 mg by mouth every 4 (four) hours as needed for headache or mild pain. )    . apixaban (ELIQUIS) 5 MG TABS tablet Take 1 tablet (5 mg total) by mouth 2 (two) times daily. 60 tablet 6  . digoxin (LANOXIN) 0.125 MG tablet Take 1 tablet (0.125 mg total) by mouth daily. 30 tablet 6  . furosemide (LASIX) 40 MG tablet Take 1.5 tablets (60 mg total) by mouth daily. 30 tablet 1  . losartan (COZAAR) 25 MG tablet Take 1 tablet (25 mg total) by mouth daily. 30 tablet 1  . magnesium oxide (MAG-OX) 400 MG tablet Take 400 mg by mouth daily.    . metoprolol (LOPRESSOR) 50 MG tablet Take 1 tablet (50 mg total) by mouth 2 (two) times daily. 60 tablet 6  . potassium chloride SA (K-DUR,KLOR-CON) 20 MEQ tablet Take 1 tablet (20 mEq total) by mouth daily. 30 tablet 1  . sotalol (BETAPACE) 120 MG tablet Take 1 tablet (120 mg total) by mouth every 12 (twelve) hours. 60 tablet 1  . Magnesium 250 MG TABS Take 1 tablet (250 mg total) by mouth daily. (Patient not taking: Reported on 11/20/2016)  0  . rosuvastatin (CRESTOR) 10 MG tablet Take 1 tablet (10 mg total) by mouth daily. 90 tablet 3    Home: Home Living Family/patient expects to be discharged to:: Private residence Living Arrangements: Alone Available Help at Discharge:  Family Type of Home: Apartment Home Access: Level entry Home Layout: One level Bathroom Shower/Tub: Chiropodist: Standard Home Equipment: None  Functional History: Prior Function Level of Independence: Independent Functional Status:  Mobility: Bed Mobility Overal bed mobility: Needs Assistance Bed Mobility: Supine to Sit Supine to sit: Supervision General bed mobility comments: no physical assist required, increased time and effort to perform. Reliance on side rail noted.  Transfers Overall transfer level: Needs assistance Equipment used: Rolling walker (2 wheeled) Transfers: Sit to/from Stand Sit to Stand: Min assist, From elevated surface General transfer comment: Min assist to power up to standing using RW, cues for modified hand placement. Patient with increased anxiety about coming to upright, feels like she is "falling over" Heavy reliance on UE support to come erect. Performed from commode over toilet using grab bar with continued min guard for safety to power up. Ambulation/Gait Ambulation/Gait assistance: Min guard, Min assist Ambulation Distance (Feet): 18 Feet Assistive device: Rolling walker (2 wheeled) Gait Pattern/deviations: Step-through pattern, Decreased stride length, Shuffle, Wide base of support, Trunk flexed General Gait Details: some instability noted in ambulation, heavy reliance on RW for stability, poor awareness  Gait velocity:  decreased Gait velocity interpretation: Below normal speed for age/gender    ADL: ADL Overall ADL's : Needs assistance/impaired Eating/Feeding: Set up, Sitting Grooming: Wash/dry hands, Wash/dry face, Oral care, Standing Grooming Details (indicate cue type and reason): relies on single UE for balance Upper Body Bathing: Minimal assistance Lower Body Bathing: Total assistance Upper Body Dressing : Minimal assistance Lower Body Dressing: Total assistance Toilet Transfer: Moderate assistance Toilet Transfer  Details (indicate cue type and reason): pt required (A) with Rw to sequence to seat Toileting- Clothing Manipulation and Hygiene: Total assistance Toileting - Clothing Manipulation Details (indicate cue type and reason): unable to complete posterior peri care Functional mobility during ADLs: Minimal assistance  Cognition: Cognition Overall Cognitive Status: No family/caregiver present to determine baseline cognitive functioning Orientation Level: Oriented X4 Cognition Arousal/Alertness: Awake/alert Behavior During Therapy: Anxious Overall Cognitive Status: No family/caregiver present to determine baseline cognitive functioning  Blood pressure 139/82, pulse 83, temperature 98.5 F (36.9 C), resp. rate 18, height 5\' 5"  (1.651 m), weight 109.8 kg (242 lb), SpO2 97 %. Physical Exam  Constitutional: She is oriented to person, place, and time.  65 year old right-handed obese female  HENT:  Head: Normocephalic.  Eyes: EOM are normal.  Neck: Normal range of motion. Neck supple. No thyromegaly present.  Cardiovascular:  Regular rate  Respiratory: Effort normal and breath sounds normal. No respiratory distress.  GI: Soft. Bowel sounds are normal. She exhibits no distension.  Neurological: She is alert and oriented to person, place, and time.  Follows commands. Cognitively appropriate. No focal limb ataxia. Strength grossly 4/5 prox to distal in all 4's. Normal sensation to LT/PP in all 4's.   Skin: Skin is warm and dry.    Results for orders placed or performed during the hospital encounter of 11/20/16 (from the past 24 hour(s))  Protime-INR     Status: None   Collection Time: 11/20/16  3:32 PM  Result Value Ref Range   Prothrombin Time 14.9 11.4 - 15.2 seconds   INR 1.17   Troponin I     Status: Abnormal   Collection Time: 11/20/16  3:32 PM  Result Value Ref Range   Troponin I 0.03 (HH) <0.03 ng/mL  Troponin I     Status: Abnormal   Collection Time: 11/20/16 10:22 PM  Result Value  Ref Range   Troponin I 0.03 (HH) <0.03 ng/mL  Lipid panel     Status: None   Collection Time: 11/21/16  5:41 AM  Result Value Ref Range   Cholesterol 144 0 - 200 mg/dL   Triglycerides 60 <150 mg/dL   HDL 43 >40 mg/dL   Total CHOL/HDL Ratio 3.3 RATIO   VLDL 12 0 - 40 mg/dL   LDL Cholesterol 89 0 - 99 mg/dL  CBC     Status: None   Collection Time: 11/21/16  5:41 AM  Result Value Ref Range   WBC 7.4 4.0 - 10.5 K/uL   RBC 4.57 3.87 - 5.11 MIL/uL   Hemoglobin 13.3 12.0 - 15.0 g/dL   HCT 42.8 36.0 - 46.0 %   MCV 93.7 78.0 - 100.0 fL   MCH 29.1 26.0 - 34.0 pg   MCHC 31.1 30.0 - 36.0 g/dL   RDW 14.0 11.5 - 15.5 %   Platelets 160 150 - 400 K/uL  Basic metabolic panel     Status: Abnormal   Collection Time: 11/21/16  5:41 AM  Result Value Ref Range   Sodium 142 135 - 145 mmol/L   Potassium 3.9 3.5 -  5.1 mmol/L   Chloride 100 (L) 101 - 111 mmol/L   CO2 35 (H) 22 - 32 mmol/L   Glucose, Bld 98 65 - 99 mg/dL   BUN 14 6 - 20 mg/dL   Creatinine, Ser 1.00 0.44 - 1.00 mg/dL   Calcium 8.9 8.9 - 10.3 mg/dL   GFR calc non Af Amer 59 (L) >60 mL/min   GFR calc Af Amer >60 >60 mL/min   Anion gap 7 5 - 15   Ct Angio Head W/cm &/or Wo Cm  Result Date: 11/20/2016 CLINICAL DATA:  Headache at the level the left eye beginning today. Dizziness. Stroke. Left superior cerebellar infarct. EXAM: CT ANGIOGRAPHY HEAD AND NECK TECHNIQUE: Multidetector CT imaging of the head and neck was performed using the standard protocol during bolus administration of intravenous contrast. Multiplanar CT image reconstructions and MIPs were obtained to evaluate the vascular anatomy. Carotid stenosis measurements (when applicable) are obtained utilizing NASCET criteria, using the distal internal carotid diameter as the denominator. CONTRAST:  50 mL Isovue 370 COMPARISON:  MRI of the brain from the same day. FINDINGS: CTA NECK FINDINGS Aortic arch: Study is somewhat degraded by patient motion. Atherosclerotic calcifications are  present at the aortic arch without significant stenosis. Right carotid system: The right common carotid artery demonstrates mild tortuosity without significant stenosis. The bifurcation is unremarkable. There is mild tortuosity within the cervical right ICA is well. No focal stenosis present. Left carotid system: The left common carotid artery is within normal limits. Calcifications are present at the left carotid bifurcation without significant stenosis. The cervical left ICA is unremarkable. Vertebral arteries: The right vertebral artery originates from the subclavian artery. The origin is obscured by artifact. The left vertebral artery is not visualized proximally is likely occluded. It is reconstituted at the C2 level via musculoskeletal branches. There is no significant focal stenosis of the right vertebral artery in the neck. Skeleton: Multilevel endplate changes are present throughout cervical spine. Uncovertebral spurring contributes to osseous foraminal narrowing most evident at C4-5 and C5-6, left greater than right. Other neck: The soft tissues the neck are unremarkable. No significant adenopathy is present. Upper chest: The lung apices demonstrate mild dependent atelectasis. No focal nodule or mass lesion is present. The superior mediastinum is otherwise unremarkable. The thoracic inlet is within normal limits. Review of the MIP images confirms the above findings CTA HEAD FINDINGS Anterior circulation: Atherosclerotic calcifications are present within the cavernous internal carotid arteries bilaterally without a significant stenosis through the ICA termini bilaterally. The A1 and M1 segments are normal. The MCA bifurcations are intact bilaterally. The anterior communicating artery is patent. Mild distal small vessel disease is present without a significant proximal stenosis or occlusion. Posterior circulation: The right vertebral artery is dominant to left. Calcifications are present at dural margin of  the right vertebral artery without a significant stenosis. The vertebrobasilar junction is intact. The PICA origins are visualized and normal. The left superior cerebellar artery is occluded, consistent with the area of infarction. The left PICA is engorged. This likely represents luxury perfusion. Both posterior cerebral arteries originate from the basilar tip. Both posterior cerebral arteries originate from the basilar tip. The PCA branch vessels are within normal limits bilaterally. Venous sinuses: The dural sinuses are patent. The right transverse sinus is dominant. The straight sinus and deep cerebral veins are intact. Cortical veins are unremarkable. Anatomic variants: None Delayed phase: The postcontrast images demonstrate no pathologic enhancement. The left superior cerebellar infarct is again noted. Review of  the MIP images confirms the above findings IMPRESSION: 1. Left superior cerebellar nonhemorrhagic infarct. 2. Occlusion of the left superior cerebellar artery. 3. Mild engorgement of the left posterior inferior cerebellar artery likely representing luxury perfusion. 4. Atherosclerotic calcifications at the dural margin of the right vertebral artery in both cavernous internal carotid arteries without significant stenosis. 5. Mild atherosclerotic changes of the left carotid bifurcation and at the aortic arch without significant stenosis. 6. Multilevel spondylosis of the cervical spine is most evident at C4-5 and C5-6. Electronically Signed   By: San Morelle M.D.   On: 11/20/2016 12:13   Ct Head Wo Contrast  Result Date: 11/20/2016 CLINICAL DATA:  Headache behind left eye.  Dizziness. EXAM: CT HEAD WITHOUT CONTRAST TECHNIQUE: Contiguous axial images were obtained from the base of the skull through the vertex without intravenous contrast. COMPARISON:  None. FINDINGS: Brain: No mass lesion, intraparenchymal hemorrhage or extra-axial collection. No evidence of acute cortical infarct. Brain  parenchyma and CSF-containing spaces are normal for age. Vascular: Atherosclerotic calcification of the vertebral and internal carotid arteries at the skullbase. Skull: Normal visualized skull base, calvarium and extracranial soft tissues. Sinuses/Orbits: No sinus fluid levels or advanced mucosal thickening. No mastoid effusion. Normal orbits. IMPRESSION: No acute intracranial abnormality. Electronically Signed   By: Ulyses Jarred M.D.   On: 11/20/2016 06:21   Ct Angio Neck W/cm &/or Wo/cm  Result Date: 11/20/2016 CLINICAL DATA:  Headache at the level the left eye beginning today. Dizziness. Stroke. Left superior cerebellar infarct. EXAM: CT ANGIOGRAPHY HEAD AND NECK TECHNIQUE: Multidetector CT imaging of the head and neck was performed using the standard protocol during bolus administration of intravenous contrast. Multiplanar CT image reconstructions and MIPs were obtained to evaluate the vascular anatomy. Carotid stenosis measurements (when applicable) are obtained utilizing NASCET criteria, using the distal internal carotid diameter as the denominator. CONTRAST:  50 mL Isovue 370 COMPARISON:  MRI of the brain from the same day. FINDINGS: CTA NECK FINDINGS Aortic arch: Study is somewhat degraded by patient motion. Atherosclerotic calcifications are present at the aortic arch without significant stenosis. Right carotid system: The right common carotid artery demonstrates mild tortuosity without significant stenosis. The bifurcation is unremarkable. There is mild tortuosity within the cervical right ICA is well. No focal stenosis present. Left carotid system: The left common carotid artery is within normal limits. Calcifications are present at the left carotid bifurcation without significant stenosis. The cervical left ICA is unremarkable. Vertebral arteries: The right vertebral artery originates from the subclavian artery. The origin is obscured by artifact. The left vertebral artery is not visualized  proximally is likely occluded. It is reconstituted at the C2 level via musculoskeletal branches. There is no significant focal stenosis of the right vertebral artery in the neck. Skeleton: Multilevel endplate changes are present throughout cervical spine. Uncovertebral spurring contributes to osseous foraminal narrowing most evident at C4-5 and C5-6, left greater than right. Other neck: The soft tissues the neck are unremarkable. No significant adenopathy is present. Upper chest: The lung apices demonstrate mild dependent atelectasis. No focal nodule or mass lesion is present. The superior mediastinum is otherwise unremarkable. The thoracic inlet is within normal limits. Review of the MIP images confirms the above findings CTA HEAD FINDINGS Anterior circulation: Atherosclerotic calcifications are present within the cavernous internal carotid arteries bilaterally without a significant stenosis through the ICA termini bilaterally. The A1 and M1 segments are normal. The MCA bifurcations are intact bilaterally. The anterior communicating artery is patent. Mild distal small vessel  disease is present without a significant proximal stenosis or occlusion. Posterior circulation: The right vertebral artery is dominant to left. Calcifications are present at dural margin of the right vertebral artery without a significant stenosis. The vertebrobasilar junction is intact. The PICA origins are visualized and normal. The left superior cerebellar artery is occluded, consistent with the area of infarction. The left PICA is engorged. This likely represents luxury perfusion. Both posterior cerebral arteries originate from the basilar tip. Both posterior cerebral arteries originate from the basilar tip. The PCA branch vessels are within normal limits bilaterally. Venous sinuses: The dural sinuses are patent. The right transverse sinus is dominant. The straight sinus and deep cerebral veins are intact. Cortical veins are unremarkable.  Anatomic variants: None Delayed phase: The postcontrast images demonstrate no pathologic enhancement. The left superior cerebellar infarct is again noted. Review of the MIP images confirms the above findings IMPRESSION: 1. Left superior cerebellar nonhemorrhagic infarct. 2. Occlusion of the left superior cerebellar artery. 3. Mild engorgement of the left posterior inferior cerebellar artery likely representing luxury perfusion. 4. Atherosclerotic calcifications at the dural margin of the right vertebral artery in both cavernous internal carotid arteries without significant stenosis. 5. Mild atherosclerotic changes of the left carotid bifurcation and at the aortic arch without significant stenosis. 6. Multilevel spondylosis of the cervical spine is most evident at C4-5 and C5-6. Electronically Signed   By: San Morelle M.D.   On: 11/20/2016 12:13   Mr Brain Wo Contrast  Result Date: 11/20/2016 CLINICAL DATA:  New onset lightheadedness and dizziness. Nausea, vomiting, and diarrhea. EXAM: MRI HEAD WITHOUT CONTRAST TECHNIQUE: Multiplanar, multiecho pulse sequences of the brain and surrounding structures were obtained without intravenous contrast. COMPARISON:  CT head without contrast 11/20/2016 FINDINGS: Brain: An acute/subacute nonhemorrhagic infarct is evident within the left superior cerebellum. No acute supratentorial infarct is present. Subtle T2 changes are associated with the infarct suggesting a more acute timeframe. A remote cortical infarct is present in the right occipital lobe medially. No other focal infarct is present. Mild periventricular T2 changes are within normal limits for age. The ventricles are normal size. No significant extra-axial fluid collection is present. Vascular: Flow is present in the major intracranial arteries. 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 The skullbase is otherwise within normal limits. The sella turcica is enlarged. No  mass lesion is present. Midline sagittal structures are otherwise unremarkable. Degenerative changes are noted in the upper cervical spine with moderate cervical stenosis suggested at C4-5. Sinuses/Orbits: The paranasal sinuses and the mastoid air cells are clear. The globes and orbits are within normal limits. IMPRESSION: 1. Acute nonhemorrhagic left superior cerebellar infarct. 2. Normal flow is present within the major arteries of the circle of Willis. 3. Remote right occipital lobe infarct. Electronically Signed   By: San Morelle M.D.   On: 11/20/2016 08:41   Dg Chest Port 1 View  Result Date: 11/20/2016 CLINICAL DATA:  Shortness of breath. EXAM: PORTABLE CHEST 1 VIEW COMPARISON:  10/20/2016 and 05/04/2008 FINDINGS: There is cardiomegaly and aortic atherosclerosis. There is new pulmonary vascular congestion. No discrete effusions. IMPRESSION: Findings consistent with congestive heart failure with pulmonary vascular congestion and cardiomegaly. Aortic atherosclerosis. Electronically Signed   By: Lorriane Shire M.D.   On: 11/20/2016 11:34    Assessment/Plan: Diagnosis: left superior cerebellar infarct 1. Does the need for close, 24 hr/day medical supervision in concert with the patient's rehab needs make it unreasonable for this patient to be served  in a less intensive setting? Yes 2. Co-Morbidities requiring supervision/potential complications: morbid obesity, CAD, CHF/CM, afib, raynaud's 3. Due to bladder management, bowel management, safety, skin/wound care, disease management, medication administration, pain management and patient education, does the patient require 24 hr/day rehab nursing? Yes 4. Does the patient require coordinated care of a physician, rehab nurse, PT (1-2 hrs/day, 5 days/week) and OT (1-2 hrs/day, 5 days/week) to address physical and functional deficits in the context of the above medical diagnosis(es)? Yes Addressing deficits in the following areas: balance,  endurance, locomotion, strength, transferring, bowel/bladder control, bathing, dressing, feeding, grooming, toileting and psychosocial support 5. Can the patient actively participate in an intensive therapy program of at least 3 hrs of therapy per day at least 5 days per week? Yes 6. The potential for patient to make measurable gains while on inpatient rehab is excellent 7. Anticipated functional outcomes upon discharge from inpatient rehab are modified independent  with PT, modified independent with OT, n/a with SLP. 8. Estimated rehab length of stay to reach the above functional goals is: 6-7 days 9. Does the patient have adequate social supports and living environment to accommodate these discharge functional goals? Yes and Potentially 10. Anticipated D/C setting: Home 11. Anticipated post D/C treatments: HH therapy and Outpatient therapy 12. Overall Rehab/Functional Prognosis: excellent  RECOMMENDATIONS: This patient's condition is appropriate for continued rehabilitative care in the following setting: CIR Patient has agreed to participate in recommended program. Yes Note that insurance prior authorization may be required for reimbursement for recommended care.  Comment: Pt was active, independent and working prior to stroke. Is motivated to recover.  Rehab Admissions Coordinator to follow up.  Thanks,  Meredith Staggers, MD, Mellody Drown    Cathlyn Parsons., PA-C 11/21/2016

## 2016-11-21 NOTE — Progress Notes (Signed)
Rehab Admissions Coordinator Note:  Patient was screened by Retta Diones for appropriateness for an Inpatient Acute Rehab Consult.  At this time, we are recommending Inpatient Rehab consult.  Retta Diones 11/21/2016, 11:49 AM  I can be reached at [phone].

## 2016-11-21 NOTE — Progress Notes (Signed)
Patient Name: Patricia Wagner Date of Encounter: 11/21/2016  Primary Cardiologist: Dr. Allred/Dr. [facility] Problem List     Active Problems:   HLD (hyperlipidemia)   Essential hypertension   CKD (chronic kidney disease), stage III   Paroxysmal atrial fibrillation (HCC)   Bradycardia   Stroke (HCC)   Chronic systolic congestive heart failure (HCC)     Subjective   Converted to atrial fibrillation last night with HR in 70-80's.  Inpatient Medications    Scheduled Meds: .  stroke: mapping our early stages of recovery book   Does not apply Once  . apixaban  5 mg Oral BID  . aspirin  300 mg Rectal Daily   Or  . aspirin  325 mg Oral Daily  . furosemide  60 mg Oral Daily  . magnesium oxide  400 mg Oral Daily  . mouth rinse  15 mL Mouth Rinse BID  . rosuvastatin  10 mg Oral QHS  . sodium chloride flush  3 mL Intravenous Q12H   Continuous Infusions:  PRN Meds: acetaminophen **OR** acetaminophen, hydrALAZINE, ondansetron **OR** ondansetron (ZOFRAN) IV, traMADol   Vital Signs    Vitals:   11/21/16 0308 11/21/16 0514 11/21/16 0519 11/21/16 0954  BP: 136/68 136/81  139/82  Pulse: 76 83    Resp: 18 18  18   Temp:  98.9 F (37.2 C)  98.5 F (36.9 C)  TempSrc:  Oral    SpO2: 95% (!) 88% 97% 97%  Weight:      Height:        Intake/Output Summary (Last 24 hours) at 11/21/16 1112 Last data filed at 11/20/16 2200  Gross per 24 hour  Intake                3 ml  Output                0 ml  Net                3 ml   Filed Weights   11/20/16 0522  Weight: 242 lb (109.8 kg)    Physical Exam    GEN: Well nourished, well developed, in no acute distress.  HEENT: Grossly normal.  Neck: Supple, no JVD, carotid bruits, or masses. Cardiac: RRR, no murmurs, rubs, or gallops. No clubbing, cyanosis, edema.  Radials/DP/PT 2+ and equal bilaterally.  Respiratory:  Respirations regular and unlabored, clear to auscultation bilaterally. GI: Soft, nontender, nondistended,  BS + x 4. MS: no deformity or atrophy. Skin: warm and dry, no rash. Neuro:  Strength and sensation are intact. Psych: AAOx3.  Normal affect.  Labs    CBC  Recent Labs  11/20/16 0527 11/21/16 0541  WBC 9.5 7.4  HGB 13.6 13.3  HCT 43.6 42.8  MCV 93.2 93.7  PLT 167 947   Basic Metabolic Panel  Recent Labs  11/20/16 0527 11/21/16 0541  NA 139 142  K 3.6 3.9  CL 101 100*  CO2 32 35*  GLUCOSE 177* 98  BUN 22* 14  CREATININE 1.14* 1.00  CALCIUM 8.8* 8.9   Liver Function Tests  Recent Labs  11/20/16 0527  AST 65*  ALT 51  ALKPHOS 60  BILITOT 0.6  PROT 6.6  ALBUMIN 3.1*   No results for input(s): LIPASE, AMYLASE in the last 72 hours. Cardiac Enzymes  Recent Labs  11/20/16 1121 11/20/16 1532 11/20/16 2222  TROPONINI <0.03 0.03* 0.03*   BNP Invalid input(s): POCBNP D-Dimer No results for input(s): DDIMER in  the last 72 hours. Hemoglobin A1C No results for input(s): HGBA1C in the last 72 hours. Fasting Lipid Panel  Recent Labs  11/21/16 0541  CHOL 144  HDL 43  LDLCALC 89  TRIG 60  CHOLHDL 3.3   Thyroid Function Tests No results for input(s): TSH, T4TOTAL, T3FREE, THYROIDAB in the last 72 hours.  Invalid input(s): FREET3  Telemetry    Atrial fibrillation - Personally Reviewed  ECG    Last EKG sinus bradycardia prior to converting to afib - Personally Reviewed  Radiology    Ct Angio Head W/cm &/or Wo Cm  Result Date: 11/20/2016 CLINICAL DATA:  Headache at the level the left eye beginning today. Dizziness. Stroke. Left superior cerebellar infarct. EXAM: CT ANGIOGRAPHY HEAD AND NECK TECHNIQUE: Multidetector CT imaging of the head and neck was performed using the standard protocol during bolus administration of intravenous contrast. Multiplanar CT image reconstructions and MIPs were obtained to evaluate the vascular anatomy. Carotid stenosis measurements (when applicable) are obtained utilizing NASCET criteria, using the distal internal  carotid diameter as the denominator. CONTRAST:  50 mL Isovue 370 COMPARISON:  MRI of the brain from the same day. FINDINGS: CTA NECK FINDINGS Aortic arch: Study is somewhat degraded by patient motion. Atherosclerotic calcifications are present at the aortic arch without significant stenosis. Right carotid system: The right common carotid artery demonstrates mild tortuosity without significant stenosis. The bifurcation is unremarkable. There is mild tortuosity within the cervical right ICA is well. No focal stenosis present. Left carotid system: The left common carotid artery is within normal limits. Calcifications are present at the left carotid bifurcation without significant stenosis. The cervical left ICA is unremarkable. Vertebral arteries: The right vertebral artery originates from the subclavian artery. The origin is obscured by artifact. The left vertebral artery is not visualized proximally is likely occluded. It is reconstituted at the C2 level via musculoskeletal branches. There is no significant focal stenosis of the right vertebral artery in the neck. Skeleton: Multilevel endplate changes are present throughout cervical spine. Uncovertebral spurring contributes to osseous foraminal narrowing most evident at C4-5 and C5-6, left greater than right. Other neck: The soft tissues the neck are unremarkable. No significant adenopathy is present. Upper chest: The lung apices demonstrate mild dependent atelectasis. No focal nodule or mass lesion is present. The superior mediastinum is otherwise unremarkable. The thoracic inlet is within normal limits. Review of the MIP images confirms the above findings CTA HEAD FINDINGS Anterior circulation: Atherosclerotic calcifications are present within the cavernous internal carotid arteries bilaterally without a significant stenosis through the ICA termini bilaterally. The A1 and M1 segments are normal. The MCA bifurcations are intact bilaterally. The anterior  communicating artery is patent. Mild distal small vessel disease is present without a significant proximal stenosis or occlusion. Posterior circulation: The right vertebral artery is dominant to left. Calcifications are present at dural margin of the right vertebral artery without a significant stenosis. The vertebrobasilar junction is intact. The PICA origins are visualized and normal. The left superior cerebellar artery is occluded, consistent with the area of infarction. The left PICA is engorged. This likely represents luxury perfusion. Both posterior cerebral arteries originate from the basilar tip. Both posterior cerebral arteries originate from the basilar tip. The PCA branch vessels are within normal limits bilaterally. Venous sinuses: The dural sinuses are patent. The right transverse sinus is dominant. The straight sinus and deep cerebral veins are intact. Cortical veins are unremarkable. Anatomic variants: None Delayed phase: The postcontrast images demonstrate no pathologic enhancement.  The left superior cerebellar infarct is again noted. Review of the MIP images confirms the above findings IMPRESSION: 1. Left superior cerebellar nonhemorrhagic infarct. 2. Occlusion of the left superior cerebellar artery. 3. Mild engorgement of the left posterior inferior cerebellar artery likely representing luxury perfusion. 4. Atherosclerotic calcifications at the dural margin of the right vertebral artery in both cavernous internal carotid arteries without significant stenosis. 5. Mild atherosclerotic changes of the left carotid bifurcation and at the aortic arch without significant stenosis. 6. Multilevel spondylosis of the cervical spine is most evident at C4-5 and C5-6. Electronically Signed   By: San Morelle M.D.   On: 11/20/2016 12:13   Ct Head Wo Contrast  Result Date: 11/20/2016 CLINICAL DATA:  Headache behind left eye.  Dizziness. EXAM: CT HEAD WITHOUT CONTRAST TECHNIQUE: Contiguous axial images  were obtained from the base of the skull through the vertex without intravenous contrast. COMPARISON:  None. FINDINGS: Brain: No mass lesion, intraparenchymal hemorrhage or extra-axial collection. No evidence of acute cortical infarct. Brain parenchyma and CSF-containing spaces are normal for age. Vascular: Atherosclerotic calcification of the vertebral and internal carotid arteries at the skullbase. Skull: Normal visualized skull base, calvarium and extracranial soft tissues. Sinuses/Orbits: No sinus fluid levels or advanced mucosal thickening. No mastoid effusion. Normal orbits. IMPRESSION: No acute intracranial abnormality. Electronically Signed   By: Ulyses Jarred M.D.   On: 11/20/2016 06:21   Ct Angio Neck W/cm &/or Wo/cm  Result Date: 11/20/2016 CLINICAL DATA:  Headache at the level the left eye beginning today. Dizziness. Stroke. Left superior cerebellar infarct. EXAM: CT ANGIOGRAPHY HEAD AND NECK TECHNIQUE: Multidetector CT imaging of the head and neck was performed using the standard protocol during bolus administration of intravenous contrast. Multiplanar CT image reconstructions and MIPs were obtained to evaluate the vascular anatomy. Carotid stenosis measurements (when applicable) are obtained utilizing NASCET criteria, using the distal internal carotid diameter as the denominator. CONTRAST:  50 mL Isovue 370 COMPARISON:  MRI of the brain from the same day. FINDINGS: CTA NECK FINDINGS Aortic arch: Study is somewhat degraded by patient motion. Atherosclerotic calcifications are present at the aortic arch without significant stenosis. Right carotid system: The right common carotid artery demonstrates mild tortuosity without significant stenosis. The bifurcation is unremarkable. There is mild tortuosity within the cervical right ICA is well. No focal stenosis present. Left carotid system: The left common carotid artery is within normal limits. Calcifications are present at the left carotid bifurcation  without significant stenosis. The cervical left ICA is unremarkable. Vertebral arteries: The right vertebral artery originates from the subclavian artery. The origin is obscured by artifact. The left vertebral artery is not visualized proximally is likely occluded. It is reconstituted at the C2 level via musculoskeletal branches. There is no significant focal stenosis of the right vertebral artery in the neck. Skeleton: Multilevel endplate changes are present throughout cervical spine. Uncovertebral spurring contributes to osseous foraminal narrowing most evident at C4-5 and C5-6, left greater than right. Other neck: The soft tissues the neck are unremarkable. No significant adenopathy is present. Upper chest: The lung apices demonstrate mild dependent atelectasis. No focal nodule or mass lesion is present. The superior mediastinum is otherwise unremarkable. The thoracic inlet is within normal limits. Review of the MIP images confirms the above findings CTA HEAD FINDINGS Anterior circulation: Atherosclerotic calcifications are present within the cavernous internal carotid arteries bilaterally without a significant stenosis through the ICA termini bilaterally. The A1 and M1 segments are normal. The MCA bifurcations are intact bilaterally.  The anterior communicating artery is patent. Mild distal small vessel disease is present without a significant proximal stenosis or occlusion. Posterior circulation: The right vertebral artery is dominant to left. Calcifications are present at dural margin of the right vertebral artery without a significant stenosis. The vertebrobasilar junction is intact. The PICA origins are visualized and normal. The left superior cerebellar artery is occluded, consistent with the area of infarction. The left PICA is engorged. This likely represents luxury perfusion. Both posterior cerebral arteries originate from the basilar tip. Both posterior cerebral arteries originate from the basilar tip.  The PCA branch vessels are within normal limits bilaterally. Venous sinuses: The dural sinuses are patent. The right transverse sinus is dominant. The straight sinus and deep cerebral veins are intact. Cortical veins are unremarkable. Anatomic variants: None Delayed phase: The postcontrast images demonstrate no pathologic enhancement. The left superior cerebellar infarct is again noted. Review of the MIP images confirms the above findings IMPRESSION: 1. Left superior cerebellar nonhemorrhagic infarct. 2. Occlusion of the left superior cerebellar artery. 3. Mild engorgement of the left posterior inferior cerebellar artery likely representing luxury perfusion. 4. Atherosclerotic calcifications at the dural margin of the right vertebral artery in both cavernous internal carotid arteries without significant stenosis. 5. Mild atherosclerotic changes of the left carotid bifurcation and at the aortic arch without significant stenosis. 6. Multilevel spondylosis of the cervical spine is most evident at C4-5 and C5-6. Electronically Signed   By: San Morelle M.D.   On: 11/20/2016 12:13   Mr Brain Wo Contrast  Result Date: 11/20/2016 CLINICAL DATA:  New onset lightheadedness and dizziness. Nausea, vomiting, and diarrhea. EXAM: MRI HEAD WITHOUT CONTRAST TECHNIQUE: Multiplanar, multiecho pulse sequences of the brain and surrounding structures were obtained without intravenous contrast. COMPARISON:  CT head without contrast 11/20/2016 FINDINGS: Brain: An acute/subacute nonhemorrhagic infarct is evident within the left superior cerebellum. No acute supratentorial infarct is present. Subtle T2 changes are associated with the infarct suggesting a more acute timeframe. A remote cortical infarct is present in the right occipital lobe medially. No other focal infarct is present. Mild periventricular T2 changes are within normal limits for age. The ventricles are normal size. No significant extra-axial fluid collection is  present. Vascular: Flow is present in the major intracranial arteries. 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 The skullbase is otherwise within normal limits. The sella turcica is enlarged. No mass lesion is present. Midline sagittal structures are otherwise unremarkable. Degenerative changes are noted in the upper cervical spine with moderate cervical stenosis suggested at C4-5. Sinuses/Orbits: The paranasal sinuses and the mastoid air cells are clear. The globes and orbits are within normal limits. IMPRESSION: 1. Acute nonhemorrhagic left superior cerebellar infarct. 2. Normal flow is present within the major arteries of the circle of Willis. 3. Remote right occipital lobe infarct. Electronically Signed   By: San Morelle M.D.   On: 11/20/2016 08:41   Dg Chest Port 1 View  Result Date: 11/20/2016 CLINICAL DATA:  Shortness of breath. EXAM: PORTABLE CHEST 1 VIEW COMPARISON:  10/20/2016 and 05/04/2008 FINDINGS: There is cardiomegaly and aortic atherosclerosis. There is new pulmonary vascular congestion. No discrete effusions. IMPRESSION: Findings consistent with congestive heart failure with pulmonary vascular congestion and cardiomegaly. Aortic atherosclerosis. Electronically Signed   By: Lorriane Shire M.D.   On: 11/20/2016 11:34    Cardiac Studies   Echo pending  Patient Profile     65 y.o.femalewith a  past medical history significant for  persistent atrial fibrillation, non ischemic cardiomyopathy (EF 20-25% by echo 09/2016), hyperlipidemia, and hypertension. Because of cardiomyopathy, it was felt that she would require AAD therapy to maintain SR. She was concerned about cost for Tikosyn and so Sotalol was initiated. She underwent cardioversion on 11/03/16 with restorationof sinus rhythm. She reverted back to afib and underwent DCCV to NSR.  Admitted with severe bradycardia, headache with acute nonhemorrhagic left superior cerebellar infarct.      Assessment & Plan    1.  Profound bradycardia - HR 41bpm on admission.  Now in atrial fibrillation with HR 70-80s.  after holding rate suppressing meds.   Continue Eliquis.  She has not missed any doses.  CHADS2VASC score is 4.  I will ask EP to consult for further recs (?restart Sotolol or other AAD).  2.  HTN - BP controlled. Restart Losartan.    3.  NIDCM - last echo with EF 20-25% a month ago. Repeat echo pending by IM.  IF EF does not improve may need AICD.    (Likely would benefit from dual chamber AICD given her potential for tachybrady syndrome).    4.  Acute on chronic systolic CHF - BNP elevated at 1555 and chest xray with pulmonary vascular congestion.  Will give a dose of IV Lasix today.  I&Os inaccurate.  Weight stable on admission.     Signed, Fransico Him, MD  11/21/2016, 11:12 AM

## 2016-11-21 NOTE — Progress Notes (Signed)
Q 2 hr neuro checks and vitals completed at [place]. RN will continue to monitor.

## 2016-11-21 NOTE — Evaluation (Signed)
Speech Language Pathology Evaluation Patient Details Name: Patricia Wagner MRN: [id number] DOB: 1951/10/21 Today's Date: 11/21/2016 Time: 1330-1400 SLP Time Calculation (min) (ACUTE ONLY): 30 min  Problem List:  Patient Active Problem List   Diagnosis Date Noted  . Bradycardia 11/20/2016  . Stroke ([place]) 11/20/2016  . Chronic systolic congestive heart failure ([place]) 11/20/2016  . Cerebrovascular accident (CVA) due to embolism of left cerebellar artery ([place])   . Persistent atrial fibrillation ([place]) 11/03/2016  . Paroxysmal atrial fibrillation (HCC)   . CHF (congestive heart failure) ([place]) 10/31/2016  . NICM (nonischemic cardiomyopathy) ([place]) 10/24/2016  . CAD in native artery, s/p cardiac cath with non obstructive CAD 10/24/2016  . Acute systolic congestive heart failure ([place])   . Atrial fibrillation with rapid ventricular response ([place]) 10/20/2016  . Tobacco abuse 10/20/2016  . Obesity 10/20/2016  . CKD (chronic kidney disease), stage III 10/20/2016  . Acute CHF (congestive heart failure) ([place]) 10/20/2016  . Atrial fibrillation with RVR ([place]) 10/20/2016  . Hematochezia 06/29/2013  . Degenerative arthritis of left knee 03/15/2012  . Preventative health care 03/12/2012  . Hypersomnia   . VITAMIN D DEFICIENCY 01/09/2011  . MENOPAUSAL DISORDER 01/09/2011  . THYROID NODULE, RIGHT 01/04/2010  . HLD (hyperlipidemia) 02/03/2008  . LEG CRAMPS, NOCTURNAL 02/03/2008  . LOW BACK PAIN 07/25/2007  . DIVERTICULITIS, HX OF 07/25/2007  . Morbid obesity ([place]) 07/22/2007  . Essential hypertension 07/22/2007  . Raynaud's syndrome 07/22/2007  . DIVERTICULOSIS, COLON 07/22/2007  . SYMPTOM, EDEMA 07/22/2007   Past Medical History:  Past Medical History:  Diagnosis Date  . Arthritis   . CAD in native artery, s/p cardiac cath with non obstructive CAD 10/24/2016  . CHF (congestive heart failure) ([place])   . DIVERTICULITIS, HX OF 07/25/2007  . DIVERTICULOSIS, COLON 07/22/2007  . Dyspnea   . Edema,  peripheral    a. chronic BLE edema, R>L. Prior trauma from dog attack and accident.  Marland Kitchen HYPERLIPIDEMIA 02/03/2008  . Hypersomnia    declines w/u  . Hypertension   . MENOPAUSAL DISORDER 01/09/2011  . Morbid obesity ([place]) 07/22/2007  . NICM (nonischemic cardiomyopathy) ([place]) 10/24/2016  . Raynaud's syndrome 07/22/2007  . THYROID NODULE, RIGHT 01/04/2010  . VITAMIN D DEFICIENCY 01/09/2011   Qualifier: Diagnosis of  By: [name], [name]    Past Surgical History:  Past Surgical History:  Procedure Laterality Date  . CARDIAC CATHETERIZATION N/A 10/23/2016   Procedure: Left Heart Cath and Coronary Angiography;  Surgeon: [name], MD;  Location: [facility];  Service: Cardiovascular;  Laterality: N/A;  . CARDIOVERSION N/A 10/31/2016   Procedure: CARDIOVERSION;  Surgeon: [name], MD;  Location: [facility] ENDOSCOPY;  Service: Cardiovascular;  Laterality: N/A;  . CARDIOVERSION N/A 11/03/2016   Procedure: CARDIOVERSION;  Surgeon: [name], MD;  Location[facility] ENDOSCOPY;  Service: Cardiovascular;  Laterality: N/A;  . CARDIOVERSION N/A 11/18/2016   Procedure: CARDIOVERSION;  Surgeon: [name], MD;  Location: [facility] ENDOSCOPY;  Service: Cardiovascular;  Laterality: N/A;  . CHOLECYSTECTOMY    . COLONOSCOPY    . PARTIAL HYSTERECTOMY     1 OVARY LEFT  . POLYPECTOMY    . TEE WITHOUT CARDIOVERSION N/A 10/31/2016   Procedure: TRANSESOPHAGEAL ECHOCARDIOGRAM (TEE);  Surgeon: [name], MD;  Location: [facility];  Service: Cardiovascular;  Laterality: N/A;   HPI:  Patricia Wagner a 65 y.o.femalewith medical history significant of CAD, systolic CHF, diverticulosis, HLD, morbid obesity. Patient reports awakening at approximately 4:00 in the morning with left  eye pain and left-sided headache. Associated with dizziness. Single episode of nausea and vomiting. Dizziness is constant. Denies any blurred vision or vision loss. Overall symptoms are slowly improving. Patient reports doing very dry  mouth and having difficulty ambulating to the bathroom. After arriving in the emergency room patient complaints of some intermittent shortness of breath but denies any coughing, wheezing, fevers. She denies any recent palpitations since last cardioversion. Patient endorses compliance with home medications. Patient unsure of what all of her medicines are formed but states that she takes everything she's been prescribed. Patient also denies any recent dysuria, frequency, back pain, lower extremity swelling above baseline. MRI on 11/20/16 revealed acute nonhemorrhagic left superior cerebellar infarct and remote right occipital lobe infarct.    Assessment / Plan / Recommendation Clinical Impression  Pt presents with moderate cognitive deficits c/b deficits in the areas of problem solving, memory, sustained attention and impulsivity that effect perform on high level tasks. MOCA version 8.1 was administered and pt received a score of 13 out of possible 30 which 26 or higher considered to be within the normal range. Pt with poor frustration tolerance and frequently spoke off topic and refused to continue to attempt activities (i.e. recalling words or serial 7 task). Education provided on score and areas of deficit. Pt agreeable to results and was agreeable to continuing ST services at next venue of care, recommending CIR.     SLP Assessment  Patient needs continued Speech Lanaguage Pathology Services    Follow Up Recommendations  Inpatient Rehab    Frequency and Duration min 2x/week  2 weeks      SLP Evaluation Cognition  Overall Cognitive Status: Impaired/Different from baseline Arousal/Alertness: Awake/alert Orientation Level: Oriented X4 Attention: Sustained Sustained Attention: Impaired Sustained Attention Impairment: Verbal complex;Functional complex Memory: Impaired Memory Impairment: Decreased recall of new information;Decreased short term memory Decreased Short Term Memory: Verbal  complex;Functional complex Awareness: Impaired Awareness Impairment: Emergent impairment Problem Solving: Impaired Problem Solving Impairment: Verbal complex;Functional complex Executive Function: Reasoning;Sequencing;Organizing;Decision Making;Self Monitoring;Self Correcting Reasoning: Impaired Reasoning Impairment: Verbal complex;Functional complex Sequencing: Impaired Sequencing Impairment: Verbal complex;Functional complex Organizing: Impaired Organizing Impairment: Verbal complex;Functional complex Decision Making: Impaired Decision Making Impairment: Verbal complex;Functional complex Self Monitoring: Impaired Self Monitoring Impairment: Verbal complex;Functional complex Self Correcting: Impaired Self Correcting Impairment: Verbal complex;Functional complex Behaviors: Restless;Impulsive;Poor frustration tolerance Safety/Judgment: Impaired       Comprehension  Auditory Comprehension Overall Auditory Comprehension: Appears within functional limits for tasks assessed Yes/No Questions: Within Functional Limits Commands: Within Functional Limits Conversation: Simple Visual Recognition/Discrimination Discrimination: Not tested Reading Comprehension Reading Status: Not tested    Expression Expression Primary Mode of Expression: Verbal Verbal Expression Overall Verbal Expression: Appears within functional limits for tasks assessed Initiation: No impairment Level of Generative/Spontaneous Verbalization: Conversation Repetition: No impairment Naming: No impairment Pragmatics: No impairment Non-Verbal Means of Communication: Not applicable Written Expression Dominant Hand: Right Written Expression: Not tested   Oral / Motor  Oral Motor/Sensory Function Overall Oral Motor/Sensory Function: Within functional limits Motor Speech Overall Motor Speech: Appears within functional limits for tasks assessed Respiration: Within functional limits Phonation: Normal Resonance:  Within functional limits Articulation: Within functional limitis Intelligibility: Intelligible Motor Planning: Witnin functional limits Motor Speech Errors: Not applicable   GO            Kalven Ganim B. Rutherford Nail, M.S., CCC-SLP Speech-Language Pathologist          Dacie Mandel 11/21/2016, 2:55 PM

## 2016-11-21 NOTE — Progress Notes (Signed)
Rehab admissions - I met with patient and gave her rehab booklets.  She would like inpatient rehab admission here at [facility].  I will open case with insurance carrier and request acute inpatient rehab admission.  I will follow up again on 11/25/16.  Call me for questions.  #[phone]

## 2016-11-22 LAB — BASIC METABOLIC PANEL
ANION GAP: 10 (ref 5–15)
BUN: 15 mg/dL (ref 6–20)
CHLORIDE: 94 mmol/L — AB (ref 101–111)
CO2: 37 mmol/L — ABNORMAL HIGH (ref 22–32)
Calcium: 8.8 mg/dL — ABNORMAL LOW (ref 8.9–10.3)
Creatinine, Ser: 1.1 mg/dL — ABNORMAL HIGH (ref 0.44–1.00)
GFR, EST NON AFRICAN AMERICAN: 52 mL/min — AB (ref >60)
Glucose, Bld: 92 mg/dL (ref 65–99)
POTASSIUM: 3.5 mmol/L (ref 3.5–5.1)
SODIUM: 141 mmol/L (ref 135–145)

## 2016-11-22 MED ORDER — ENOXAPARIN SODIUM 120 MG/0.8ML ~~LOC~~ SOLN
110.0000 mg | Freq: Two times a day (BID) | SUBCUTANEOUS | Status: DC
  Administered 2016-11-22 – 2016-11-23 (×3): 110 mg via SUBCUTANEOUS
  Filled 2016-11-22 (×4): qty 0.73

## 2016-11-22 MED ORDER — WARFARIN SODIUM 7.5 MG PO TABS
7.5000 mg | ORAL_TABLET | Freq: Once | ORAL | Status: AC
  Administered 2016-11-22: 7.5 mg via ORAL
  Filled 2016-11-22: qty 1

## 2016-11-22 MED ORDER — ENOXAPARIN SODIUM 120 MG/0.8ML ~~LOC~~ SOLN
1.0000 mg/kg | Freq: Two times a day (BID) | SUBCUTANEOUS | Status: DC
  Filled 2016-11-22: qty 0.73

## 2016-11-22 MED ORDER — METOPROLOL TARTRATE 5 MG/5ML IV SOLN
2.5000 mg | Freq: Once | INTRAVENOUS | Status: AC
  Administered 2016-11-22: 2.5 mg via INTRAVENOUS
  Filled 2016-11-22: qty 5

## 2016-11-22 MED ORDER — WARFARIN - PHARMACIST DOSING INPATIENT
Freq: Every day | Status: DC
  Administered 2016-11-23: 18:00:00

## 2016-11-22 NOTE — Progress Notes (Signed)
   Subjective: Breathing is OK (on 4L )  No CP   Objective: Vitals:   11/21/16 1515 11/21/16 1700 11/21/16 2100 11/22/16 0145  BP: 134/86 137/76 (!) 148/90 (!) 148/88  Pulse: 88 82 88 92  Resp: 19 20    Temp: 98.5 F (36.9 C) 98 F (36.7 C) 98.4 F (36.9 C) 98.6 F (37 C)  TempSrc: Oral Oral Oral Oral  SpO2: 98% 99% 95% 96%  Weight:      Height:       Weight change:  No intake or output data in the 24 hours ending 11/22/16 0958  General: Alert, awake, oriented x3, in no acute distress Neck:  Difficult to assess JVP   Heart: Irregular rate and rhythm, without murmurs, rubs, gallops.  Lungs: MIld rales at R base   Exemities:  Tr to 1+ edema.   Neuro: Grossly intact, nonfocal.  Tele;  Afib    AVbera HR 70s to 90s    Lab Results: Results for orders placed or performed during the hospital encounter of 11/20/16 (from the past 24 hour(s))  Basic metabolic panel     Status: Abnormal   Collection Time: 11/22/16  3:26 AM  Result Value Ref Range   Sodium 141 135 - 145 mmol/L   Potassium 3.5 3.5 - 5.1 mmol/L   Chloride 94 (L) 101 - 111 mmol/L   CO2 37 (H) 22 - 32 mmol/L   Glucose, Bld 92 65 - 99 mg/dL   BUN 15 6 - 20 mg/dL   Creatinine, Ser 1.10 (H) 0.44 - 1.00 mg/dL   Calcium 8.8 (L) 8.9 - 10.3 mg/dL   GFR calc non Af Amer 52 (L) >60 mL/min   GFR calc Af Amer >60 >60 mL/min   Anion gap 10 5 - 15    Studies/Results: Dg Chest Port 1 View  Result Date: 11/21/2016 CLINICAL DATA:  Patient with shortness of breath. EXAM: PORTABLE CHEST 1 VIEW COMPARISON:  Chest radiograph 11/20/2016. FINDINGS: Monitoring leads overlie the patient. Stable cardiomegaly. Pulmonary vascular redistribution. Consolidation within the retrocardiac location. Possible small left pleural effusion. IMPRESSION: Cardiomegaly. Consolidation within the retrocardiac location favored to be secondary to atelectasis. Possible small left effusion. Electronically Signed   By: Lovey Newcomer M.D.   On: 11/21/2016 15:40     Medications:REviewed    @PROBHOSP @  1  Persistent atrial fib  Has faled cardioversions  For now would recomm rate control  Wll need to consider amiodarone with possible attempt in future  Would like at least 4 wks of documented antiocoag    2  CVA  While on Eliquis  ? If she got all doses   As noted by Dr Lovena Le , favor coumadin for now   2  Chrnic systolc CHF  LVEF 20 to 25%    Continue diuresis    3  Bradycardia  No recurrent spells    4  HTN    LOS: 2 days   [name] 11/22/2016, 9:58 AM

## 2016-11-22 NOTE — Progress Notes (Signed)
Case manager made aware of patient possible discharge status.

## 2016-11-22 NOTE — Progress Notes (Signed)
STROKE TEAM PROGRESS NOTE   HISTORY OF PRESENT ILLNESS (per record) Patricia Wagner is an 65 y.o. female who presented to the ED for evaluation of possible stroke. She first noted symptoms at about 4 AM this morning 11/18/2016, consisting of left eye pain and left-sided headache. She also had gait unsteadiness and constant dizziness with N/V. Family also noted slurred speech and left facial droop. The patient denied vision changes.  Symptoms somewhat improved since arriving to the ED, but still with dysarthria and left facial droop. She was recently seen by Cardiology in early December for symptomatic persistent atrial fibrillation, resulting in CHF with reduced EF. She has been anticoagulated with Eliquis and is compliant with this. She was started on Sotalol 120mg  BID at that time.  PMHx also includes CAD, diverticulitis, chronic bilateral lower extremity edema, hypersomnia, HTN, morbid obesity and Raynaud's syndrome. Patient was not administered IV t-PA secondary to being on eliquis. She was admitted for further evaluation and treatment.   SUBJECTIVE (INTERVAL HISTORY) Apparently patient complained of SOB yesterday.  Patient does not recall this complaint when questioned about the status of breathing today.  She reports no issues on ROS.  She states that she does not use home O2   OBJECTIVE Temp:  [98 F (36.7 C)-98.6 F (37 C)] 98.6 F (37 C) (12/30 0145) Pulse Rate:  [82-92] 92 (12/30 0145) Cardiac Rhythm: Atrial fibrillation (12/29 1905) Resp:  [18-20] 20 (12/29 1700) BP: (134-148)/(76-90) 148/88 (12/30 0145) SpO2:  [95 %-99 %] 96 % (12/30 0145)  CBC:   Recent Labs Lab 11/20/16 0527 11/21/16 0541  WBC 9.5 7.4  HGB 13.6 13.3  HCT 43.6 42.8  MCV 93.2 93.7  PLT 167 161    Basic Metabolic Panel:   Recent Labs Lab 11/21/16 0541 11/22/16 0326  NA 142 141  K 3.9 3.5  CL 100* 94*  CO2 35* 37*  GLUCOSE 98 92  BUN 14 15  CREATININE 1.00 1.10*  CALCIUM 8.9 8.8*    Lipid  Panel:     Component Value Date/Time   CHOL 144 11/21/2016 0541   TRIG 60 11/21/2016 0541   HDL 43 11/21/2016 0541   CHOLHDL 3.3 11/21/2016 0541   VLDL 12 11/21/2016 0541   LDLCALC 89 11/21/2016 0541   HgbA1c:  Lab Results  Component Value Date   HGBA1C 6.3 (H) 11/21/2016   Urine Drug Screen: No results found for: LABOPIA, COCAINSCRNUR, LABBENZ, AMPHETMU, THCU, LABBARB    IMAGING  Ct Head Wo Contrast 11/20/2016 No acute intracranial abnormality.     Ct Angio Head W/cm &/or Wo Cm Ct Angio Neck W/cm &/or Wo/cm 11/20/2016 1. Left superior cerebellar nonhemorrhagic infarct.  2. Occlusion of the left superior cerebellar artery.  3. Mild engorgement of the left posterior inferior cerebellar artery likely representing luxury perfusion.  4. Atherosclerotic calcifications at the dural margin of the right vertebral artery in both cavernous internal carotid arteries without significant stenosis.  5. Mild atherosclerotic changes of the left carotid bifurcation and at the aortic arch without significant stenosis.  6. Multilevel spondylosis of the cervical spine is most evident at C4-5 and C5-6.   Mr Brain Wo Contrast 11/20/2016 1. Acute nonhemorrhagic left superior cerebellar infarct. 2. Normal flow is present within the major arteries of the circle of Willis. 3. Remote right occipital lobe infarct.   Dg Chest Port 1 View 11/20/2016 Findings consistent with congestive heart failure with pulmonary vascular congestion and cardiomegaly. Aortic atherosclerosis.   2D Echocardiogram  Study Conclusions -  Left ventricle: The cavity size was mildly to moderately dilated.   Wall thickness was increased in a pattern of mild LVH. Systolic   function was severely reduced. The estimated ejection fraction   was in the range of 20% to 25%. Diffuse hypokinesis.   Indeterminant diastolic function (atrial fibrillation). - Aortic valve: There was no stenosis. - Mitral valve: Mildly calcified  annulus. There was trivial   regurgitation. - Left atrium: The atrium was severely dilated. - Right ventricle: The cavity size was normal. Systolic function   was moderately reduced. - Right atrium: The atrium was moderately dilated. - Pulmonary arteries: No complete TR doppler jet so unable to   estimate PA systolic pressure. - Systemic veins: IVC measured 1.9 cm with < 50% respirophasic   variation, suggesting RA pressure 8 mmHg. - Pericardium, extracardiac: A trivial pericardial effusion was   identified posterior to the heart. Impressions: - The patient was in atrial fibrillation. Mild to moderate LV   dilation with mild LV hypertrophy. EF 20-25%, diffuse   hypokinesis. Normal RV size with moderately decreased systolic   function. Biatrial enlargement.    PHYSICAL EXAM Pleasant middle-aged obese African-American lady who is mildly short of breath. She is on oxygen. . Afebrile. Head is nontraumatic. Neck is supple without bruit.    Cardiac exam no murmur or gallop. Lungs are clear to auscultation. Distal pulses are well felt. Neurological Exam ;  Awake  Alert oriented x 3. Normal speech and language.eye movements full without nystagmus.fundi were not visualized. Vision acuity and fields appear normal. Hearing is normal. Palatal movements are normal. Face symmetric. Tongue midline. Normal strength, tone, reflexes and coordination. Mildly diminished fine finger movements on the left and orbits right over left upper extremity. Normal sensation. Gait deferred.  ASSESSMENT/PLAN Patricia Wagner is a 65 y.o. female with history of symptomatic persistent atrial fibrillation, resulting in CHF with reduced EF anticoagulated with Eliquis, CAD, diverticulitis, chronic bilateral lower extremity edema, hypersomnia, HTN, morbid obesity and Raynaud's syndrome presenting with L eye pain and L sided HA, unsteady gait and dizziness w/ N&V. She did not receive IV t-PA due to being on eliquis.   Stroke:   superior cerebellar infarct embolic secondary to known atrial fibrillation   MRI  L superior cerebellar infarct. Old R occipital infarct.  CTA head and neck L superior cerebella infarct. Occluded L superior cerebellar artery.   2D Echo - EF 20-25%. The patient was in atrial fibrillation at the time of the study  LDL 89  HgbA1c 5.8  Eliquis for VTE prophylaxis Diet Heart Room service appropriate? Yes; Fluid consistency: Thin  Eliquis (apixaban) daily prior to admission, now on Eliquis (apixaban) daily  Patient counseled to be compliant with her antithrombotic medications  Ongoing aggressive stroke risk factor management  Therapy recommendations:  CIR planned  Disposition:  pending   Atrial Fibrillation  Recent cardioversion x 3 in the past 3 weeks  Home anticoagulation:  Eliquis (apixaban) daily continued in the hospital  Dr. Radford Pax following  Continue eliquis at discharge -> to be discussed.  Cardiology prefers Coumadin as opposed to NOAC in order to document therapeutic anticoagulation.      Hypertension  Stable  Permissive hypertension (OK if < 220/120) but gradually normalize in 5-7 days  Long-term BP goal normotensive  Hyperlipidemia  Home meds:  crestor 5, resumed in hospital  LDL 89, goal < 70  Continue statin at discharge  Other Stroke Risk Factors  Cigarette smoker, quick smoking 4 weeks  ago, advised to stop smoking  ETOH use, advised to drink no more than 1 drink(s) a day  Morbid Obesity, Body mass index is 40.27 kg/m., recommend weight loss, diet and exercise as appropriate   Coronary artery disease  Systolic CHF - EF 01-49% - complains of SOB   Other Active Problems  SOB - internal medicine MD to address. Dr. Leonie Man discussed w/ him  Bradycardia  PLAN  Discussed plans for anticoagulation.  No further recommendations  Hospital day # 2   To contact Stroke Continuity provider, please refer to [email or web address] After hours, contact  General Neurology

## 2016-11-22 NOTE — Progress Notes (Signed)
ANTICOAGULATION CONSULT NOTE - Initial Consult  Pharmacy Consult for Enoxaparin/Warfarin Indication: atrial fibrillation and stroke  Assessment: 89 yoF admitted 12/28 with acute CVA. Patient has h/o persistent Afib, previously anticoagulated with Eliquis (CHADS2VASC 5). Since patient developed a stroke on Eliquis, she will be transitioned to warfarin in preparation for another cardioversion. Pharmacy has been consulted to dose enoxaparin and warfarin during this transition. Last dose of Eliquis was 0921 on 12/30, baseline INR 1.17. CBC wnl, no bleeding noted.   Goal of Therapy:  INR 2-3 Monitor platelets by anticoagulation protocol: Yes   Plan:  Enoxaparin 110 mg (1 mg/kg) SQ every 12 hours Warfarin 7.5 mg x 1 dose tonight Daily INR, CBC as needed, s/sx's bleeding Discontinue enoxaparin when INR > 2 + at least 5 days of overlap Follow-up ASA plan (Neurololgy to decide)   Allergies  Allergen Reactions  . Ace Inhibitors Palpitations    Patient Measurements: Height: 5\' 5"  (165.1 cm) Weight: 242 lb (109.8 kg) IBW/kg (Calculated) : 57 Heparin Dosing Weight: 109.8 kg  Vital Signs: Temp: 98.7 F (37.1 C) (12/30 1035) Temp Source: Oral (12/30 1035) BP: 132/70 (12/30 1035) Pulse Rate: 89 (12/30 1035)  Labs:  Recent Labs  11/20/16 0527 11/20/16 1121 11/20/16 1532 11/20/16 2222 11/21/16 0541 11/22/16 0326  HGB 13.6  --   --   --  13.3  --   HCT 43.6  --   --   --  42.8  --   PLT 167  --   --   --  160  --   LABPROT  --   --  14.9  --   --   --   INR  --   --  1.17  --   --   --   CREATININE 1.14*  --   --   --  1.00 1.10*  TROPONINI <0.03 <0.03 0.03* 0.03*  --   --     Estimated Creatinine Clearance: 64.5 mL/min (by C-G formula based on SCr of 1.1 mg/dL (H)).   Medical History: Past Medical History:  Diagnosis Date  . Arthritis   . CAD in native artery, s/p cardiac cath with non obstructive CAD 10/24/2016  . CHF (congestive heart failure) ([place])   .  DIVERTICULITIS, HX OF 07/25/2007  . DIVERTICULOSIS, COLON 07/22/2007  . Dyspnea   . Edema, peripheral    a. chronic BLE edema, R>L. Prior trauma from dog attack and accident.  Marland Kitchen HYPERLIPIDEMIA 02/03/2008  . Hypersomnia    declines w/u  . Hypertension   . MENOPAUSAL DISORDER 01/09/2011  . Morbid obesity ([place]) 07/22/2007  . NICM (nonischemic cardiomyopathy) ([place]) 10/24/2016  . Raynaud's syndrome 07/22/2007  . THYROID NODULE, RIGHT 01/04/2010  . VITAMIN D DEFICIENCY 01/09/2011   Qualifier: Diagnosis of  By: [name], [name]     Medications:  Scheduled:  .  stroke: mapping our early stages of recovery book   Does not apply Once  . aspirin  300 mg Rectal Daily   Or  . aspirin  325 mg Oral Daily  . enoxaparin (LOVENOX) injection  1 mg/kg Subcutaneous Q12H  . furosemide  40 mg Intravenous BID  . magnesium oxide  400 mg Oral Daily  . mouth rinse  15 mL Mouth Rinse BID  . rosuvastatin  10 mg Oral QHS  . sodium chloride flush  3 mL Intravenous Q12H  . warfarin  7.5 mg Oral ONCE-1800  . Warfarin - Pharmacist Dosing Inpatient   Does not apply [phone]  Belia Heman, PharmD PGY1 Pharmacy Resident [phone] (Pager) 11/22/2016 10:46 AM

## 2016-11-22 NOTE — Progress Notes (Signed)
Offered assistance with setting up breakfast tray, Pt stated she did not need any assistance.

## 2016-11-22 NOTE — Progress Notes (Signed)
Patient ambulating self to bedside toilet.  Offered assistance, patient refused help, stating "I just want my bed made".  Call light within reach, encouraged patient to use for assistance.

## 2016-11-22 NOTE — Progress Notes (Signed)
Per Pt bath has been done

## 2016-11-22 NOTE — Progress Notes (Signed)
PROGRESS NOTE  Patricia Wagner GGY:[id number] DOB: 11-17-1951 DOA: 11/20/2016 PCP: Cathlean Cower, MD   LOS: 2 days   Brief Narrative: 65 y.o. female with medical history significant of CAD, systolic CHF, diverticulosis, HLD, morbid obesity atrial fibrillation,  who was admitted to the hospital on 12/28 with left-sided headache, dizziness, episode of nausea and vomiting. MRI on admission showed acute CVA, and neurology was consulted and she was admitted to telemetry. She was also bradycardic on admission and cardiology was consulted.   Assessment & Plan: Active Problems:   HLD (hyperlipidemia)   Essential hypertension   CKD (chronic kidney disease), stage III   Paroxysmal atrial fibrillation (HCC)   Bradycardia   Stroke (HCC)   Chronic systolic congestive heart failure (HCC)   Acute CVA  - Noted on MRI, acute nonhemorrhagic left superior cerebellar infarct  - neurology consulted, appreciate input  - 2-D echo with EF of 20-25% - Discussed with cardiology today, will stop Eliquis and transition patient to Coumadin  Paroxysmal A. Fib / symptomatic bradycardia - Her AV nodal agents were held, she is back in A. fib which is rate controlled currently. Cardiology consulted, appreciate input - She apparently underwent 3 cardioversions in the past in attempts to maintain sinus rhythm and she is quite symptomatic with A. fib - EP consulted - Her rate is in the 80s at rest, however is in the 130s to 140s when she is ambulating in the room, defer AV nodal blocking agents to cardiology and EP  Acute on chronic systolic CHF with acute hypoxic respiratory failure - Chest x-ray on admission yesterday showed pulmonary vascular congestion and cardiomegaly, patient is hypoxic on room air requiring supplemental oxygen, she is getting diuresed with Lasix twice daily, she appears comfortable, strict I's and O's and daily weights, wean off oxygen as tolerated - big contributor is presence of A. Fib - Continue  diuresis   DVT prophylaxis: Lovenox / Coumadin  Code Status: Full code Family Communication: no family bedside Disposition Plan: TBD  Consultants:   Cardiology  EP  Neurology   Procedures:   2D echo  Antimicrobials:  None    Subjective: - no chest pain, shortness of breath, no abdominal pain, nausea or vomiting. Ambulating in the room with a walker   Objective: Vitals:   11/21/16 2100 11/22/16 0145 11/22/16 1035 11/22/16 1328  BP: (!) 148/90 (!) 148/88 132/70 137/81  Pulse: 88 92 89 91  Resp:   20 20  Temp: 98.4 F (36.9 C) 98.6 F (37 C) 98.7 F (37.1 C) 98.5 F (36.9 C)  TempSrc: Oral Oral Oral Oral  SpO2: 95% 96% 98% 93%  Weight:      Height:        Intake/Output Summary (Last 24 hours) at 11/22/16 1444 Last data filed at 11/22/16 0915  Gross per 24 hour  Intake              240 ml  Output                0 ml  Net              240 ml   Filed Weights   11/20/16 0522  Weight: 109.8 kg (242 lb)    Examination: Constitutional: NAD Vitals:   11/21/16 2100 11/22/16 0145 11/22/16 1035 11/22/16 1328  BP: (!) 148/90 (!) 148/88 132/70 137/81  Pulse: 88 92 89 91  Resp:   20 20  Temp: 98.4 F (36.9 C) 98.6 F (37  C) 98.7 F (37.1 C) 98.5 F (36.9 C)  TempSrc: Oral Oral Oral Oral  SpO2: 95% 96% 98% 93%  Weight:      Height:       Eyes: PERRL, lids and conjunctivae normal Respiratory: clear to auscultation bilaterally, no wheezing, no crackles.  Cardiovascular: Regular rate and rhythm, no murmurs / rubs / gallops.  Abdomen: no tenderness. Bowel sounds positive.  Neurologic: CN 2-12 grossly intact. Strength 5/5 in all 4.  Psychiatric: Normal judgment and insight. Alert and oriented x 3. Normal mood.    Data Reviewed: I have personally reviewed following labs and imaging studies  CBC:  Recent Labs Lab 11/20/16 0527 11/21/16 0541  WBC 9.5 7.4  HGB 13.6 13.3  HCT 43.6 42.8  MCV 93.2 93.7  PLT 167 390   Basic Metabolic Panel:  Recent  Labs Lab 11/20/16 0527 11/21/16 0541 11/22/16 0326  NA 139 142 141  K 3.6 3.9 3.5  CL 101 100* 94*  CO2 32 35* 37*  GLUCOSE 177* 98 92  BUN 22* 14 15  CREATININE 1.14* 1.00 1.10*  CALCIUM 8.8* 8.9 8.8*   GFR: Estimated Creatinine Clearance: 64.5 mL/min (by C-G formula based on SCr of 1.1 mg/dL (H)). Liver Function Tests:  Recent Labs Lab 11/20/16 0527  AST 65*  ALT 51  ALKPHOS 60  BILITOT 0.6  PROT 6.6  ALBUMIN 3.1*   No results for input(s): LIPASE, AMYLASE in the last 168 hours. No results for input(s): AMMONIA in the last 168 hours. Coagulation Profile:  Recent Labs Lab 11/20/16 1532  INR 1.17   Cardiac Enzymes:  Recent Labs Lab 11/20/16 0527 11/20/16 1121 11/20/16 1532 11/20/16 2222  TROPONINI <0.03 <0.03 0.03* 0.03*   BNP (last 3 results) No results for input(s): PROBNP in the last 8760 hours. HbA1C:  Recent Labs  11/21/16 0541  HGBA1C 6.3*   CBG:  Recent Labs Lab 11/20/16 0524  GLUCAP 141*   Lipid Profile:  Recent Labs  11/21/16 0541  CHOL 144  HDL 43  LDLCALC 89  TRIG 60  CHOLHDL 3.3   Thyroid Function Tests: No results for input(s): TSH, T4TOTAL, FREET4, T3FREE, THYROIDAB in the last 72 hours. Anemia Panel: No results for input(s): VITAMINB12, FOLATE, FERRITIN, TIBC, IRON, RETICCTPCT in the last 72 hours. Urine analysis:    Component Value Date/Time   COLORURINE YELLOW 10/25/2015 [place] 10/25/2015 1658   LABSPEC 1.015 10/25/2015 1658   PHURINE 6.5 10/25/2015 1658   GLUCOSEU NEGATIVE 10/25/2015 1658   HGBUR NEGATIVE 10/25/2015 1658   BILIRUBINUR NEGATIVE 10/25/2015 1658   KETONESUR NEGATIVE 10/25/2015 1658   UROBILINOGEN 0.2 10/25/2015 1658   NITRITE POSITIVE (A) 10/25/2015 1658   LEUKOCYTESUR NEGATIVE 10/25/2015 1658   Sepsis Labs: Invalid input(s): PROCALCITONIN, LACTICIDVEN  No results found for this or any previous visit (from the past 240 hour(s)).    Radiology Studies: Dg Chest Port 1  View  Result Date: 11/21/2016 CLINICAL DATA:  Patient with shortness of breath. EXAM: PORTABLE CHEST 1 VIEW COMPARISON:  Chest radiograph 11/20/2016. FINDINGS: Monitoring leads overlie the patient. Stable cardiomegaly. Pulmonary vascular redistribution. Consolidation within the retrocardiac location. Possible small left pleural effusion. IMPRESSION: Cardiomegaly. Consolidation within the retrocardiac location favored to be secondary to atelectasis. Possible small left effusion. Electronically Signed   By: Lovey Newcomer M.D.   On: 11/21/2016 15:40     Scheduled Meds: .  stroke: mapping our early stages of recovery book   Does not apply Once  .  aspirin  300 mg Rectal Daily   Or  . aspirin  325 mg Oral Daily  . enoxaparin (LOVENOX) injection  1 mg/kg Subcutaneous Q12H  . furosemide  40 mg Intravenous BID  . magnesium oxide  400 mg Oral Daily  . mouth rinse  15 mL Mouth Rinse BID  . rosuvastatin  10 mg Oral QHS  . sodium chloride flush  3 mL Intravenous Q12H  . warfarin  7.5 mg Oral ONCE-1800  . Warfarin - Pharmacist Dosing Inpatient   Does not apply q1800   Continuous Infusions:  Marzetta Board, MD, PhD [facility] Pager [phone]  If 7PM-7AM, please contact night-coverage [email or web address] Password [email or web address] 11/22/2016, 2:44 PM

## 2016-11-23 LAB — URINALYSIS, ROUTINE W REFLEX MICROSCOPIC
BILIRUBIN URINE: NEGATIVE
Glucose, UA: NEGATIVE mg/dL
KETONES UR: NEGATIVE mg/dL
Leukocytes, UA: NEGATIVE
NITRITE: NEGATIVE
PH: 8 (ref 5.0–8.0)
Protein, ur: NEGATIVE mg/dL
SPECIFIC GRAVITY, URINE: 1.008 (ref 1.005–1.030)

## 2016-11-23 LAB — BASIC METABOLIC PANEL
Anion gap: 10 (ref 5–15)
BUN: 15 mg/dL (ref 6–20)
CHLORIDE: 90 mmol/L — AB (ref 101–111)
CO2: 39 mmol/L — AB (ref 22–32)
CREATININE: 1.01 mg/dL — AB (ref 0.44–1.00)
Calcium: 8.7 mg/dL — ABNORMAL LOW (ref 8.9–10.3)
GFR calc Af Amer: >60 mL/min (ref >60)
GFR calc non Af Amer: 58 mL/min — ABNORMAL LOW (ref >60)
Glucose, Bld: 86 mg/dL (ref 65–99)
POTASSIUM: 2.8 mmol/L — AB (ref 3.5–5.1)
SODIUM: 139 mmol/L (ref 135–145)

## 2016-11-23 LAB — CBC
HEMATOCRIT: 46.3 % — AB (ref 36.0–46.0)
HEMOGLOBIN: 14.7 g/dL (ref 12.0–15.0)
MCH: 29.1 pg (ref 26.0–34.0)
MCHC: 31.7 g/dL (ref 30.0–36.0)
MCV: 91.5 fL (ref 78.0–100.0)
Platelets: 145 10*3/uL — ABNORMAL LOW (ref 150–400)
RBC: 5.06 MIL/uL (ref 3.87–5.11)
RDW: 13.4 % (ref 11.5–15.5)
WBC: 7 10*3/uL (ref 4.0–10.5)

## 2016-11-23 LAB — PROTIME-INR
INR: 1.31
PROTHROMBIN TIME: 16.3 s — AB (ref 11.4–15.2)

## 2016-11-23 MED ORDER — ASPIRIN EC 81 MG PO TBEC
81.0000 mg | DELAYED_RELEASE_TABLET | Freq: Every day | ORAL | Status: DC
  Administered 2016-11-23 – 2016-11-25 (×3): 81 mg via ORAL
  Filled 2016-11-23 (×3): qty 1

## 2016-11-23 MED ORDER — POTASSIUM CHLORIDE CRYS ER 20 MEQ PO TBCR
40.0000 meq | EXTENDED_RELEASE_TABLET | ORAL | Status: AC
  Administered 2016-11-23 (×2): 40 meq via ORAL
  Filled 2016-11-23 (×2): qty 2

## 2016-11-23 MED ORDER — METOPROLOL TARTRATE 25 MG PO TABS
25.0000 mg | ORAL_TABLET | Freq: Two times a day (BID) | ORAL | Status: DC
  Administered 2016-11-23 – 2016-11-25 (×5): 25 mg via ORAL
  Filled 2016-11-23 (×5): qty 1

## 2016-11-23 MED ORDER — WARFARIN SODIUM 7.5 MG PO TABS
7.5000 mg | ORAL_TABLET | Freq: Once | ORAL | Status: AC
  Administered 2016-11-23: 7.5 mg via ORAL
  Filled 2016-11-23: qty 1

## 2016-11-23 NOTE — Progress Notes (Signed)
PROGRESS NOTE  Patricia Wagner WLS:[id number] DOB: 01-28-1951 DOA: 11/20/2016 PCP: Cathlean Cower, MD   LOS: 3 days   Brief Narrative: 65 y.o. female with medical history significant of CAD, systolic CHF, diverticulosis, HLD, morbid obesity atrial fibrillation,  who was admitted to the hospital on 12/28 with left-sided headache, dizziness, episode of nausea and vomiting. MRI on admission showed acute CVA, and neurology was consulted and she was admitted to telemetry. She was also bradycardic on admission and cardiology was consulted.   Assessment & Plan: Active Problems:   HLD (hyperlipidemia)   Essential hypertension   CKD (chronic kidney disease), stage III   Paroxysmal atrial fibrillation (HCC)   Bradycardia   Stroke (HCC)   Chronic systolic congestive heart failure (HCC)   Acute CVA  - Noted on MRI, acute nonhemorrhagic left superior cerebellar infarct  - neurology consulted, appreciate input  - 2-D echo with EF of 20-25% - ? Eliquis failure, switched to Coumadin  Paroxysmal A. Fib / symptomatic bradycardia - Her AV nodal agents were held, she is back in A. Fib which is rate controlled at rest, however spikes in the 130s 140s with ambulation. Metoprolol added today per cardiology. - She apparently underwent 3 cardioversions in the past in attempts to maintain sinus rhythm and she is quite symptomatic with A. fib - EP consulted  Acute on chronic systolic CHF with acute hypoxic respiratory failure - Chest x-ray on admission yesterday showed pulmonary vascular congestion and cardiomegaly, patient is hypoxic on room air requiring supplemental oxygen, she is getting diuresed with Lasix twice daily, she appears comfortable, strict I's and O's and daily weights, wean off oxygen as tolerated - big contributor is presence of A. Fib - Continue diuresis   DVT prophylaxis: Lovenox / Coumadin  Code Status: Full code Family Communication: no family bedside Disposition Plan:  TBD  Consultants:   Cardiology  EP  Neurology   Procedures:   2D echo  Antimicrobials:  None    Subjective: - no chest pain, shortness of breath, no abdominal pain, nausea or vomiting. No palpitations or chest pain   Objective: Vitals:   11/23/16 0543 11/23/16 1046 11/23/16 1133 11/23/16 1321  BP: 135/63  138/90 133/63  Pulse: 74  (!) 58 88  Resp: 20  18 16   Temp: 99.1 F (37.3 C)  98.6 F (37 C) 99.3 F (37.4 C)  TempSrc: Oral  Oral Oral  SpO2: 99%  98% 100%  Weight:  103.2 kg (227 lb 8 oz)    Height:        Intake/Output Summary (Last 24 hours) at 11/23/16 1444 Last data filed at 11/22/16 1748  Gross per 24 hour  Intake                0 ml  Output              500 ml  Net             -500 ml   Filed Weights   11/20/16 0522 11/23/16 1046  Weight: 109.8 kg (242 lb) 103.2 kg (227 lb 8 oz)    Examination: Constitutional: NAD Vitals:   11/23/16 0543 11/23/16 1046 11/23/16 1133 11/23/16 1321  BP: 135/63  138/90 133/63  Pulse: 74  (!) 58 88  Resp: 20  18 16   Temp: 99.1 F (37.3 C)  98.6 F (37 C) 99.3 F (37.4 C)  TempSrc: Oral  Oral Oral  SpO2: 99%  98% 100%  Weight:  103.2 kg (  227 lb 8 oz)    Height:       Eyes: PERRL, lids and conjunctivae normal Respiratory: clear to auscultation bilaterally, no wheezing, no crackles.  Cardiovascular: Regular rate and rhythm, no murmurs / rubs / gallops.  Abdomen: no tenderness. Bowel sounds positive.  Neurologic: CN 2-12 grossly intact. Strength 5/5 in all 4.  Psychiatric: Normal judgment and insight. Alert and oriented x 3. Normal mood.    Data Reviewed: I have personally reviewed following labs and imaging studies  CBC:  Recent Labs Lab 11/20/16 0527 11/21/16 0541 11/23/16 0448  WBC 9.5 7.4 7.0  HGB 13.6 13.3 14.7  HCT 43.6 42.8 46.3*  MCV 93.2 93.7 91.5  PLT 167 160 235*   Basic Metabolic Panel:  Recent Labs Lab 11/20/16 0527 11/21/16 0541 11/22/16 0326 11/23/16 0448  NA 139 142 141  139  K 3.6 3.9 3.5 2.8*  CL 101 100* 94* 90*  CO2 32 35* 37* 39*  GLUCOSE 177* 98 92 86  BUN 22* 14 15 15   CREATININE 1.14* 1.00 1.10* 1.01*  CALCIUM 8.8* 8.9 8.8* 8.7*   GFR: Estimated Creatinine Clearance: 68 mL/min (by C-G formula based on SCr of 1.01 mg/dL (H)). Liver Function Tests:  Recent Labs Lab 11/20/16 0527  AST 65*  ALT 51  ALKPHOS 60  BILITOT 0.6  PROT 6.6  ALBUMIN 3.1*   No results for input(s): LIPASE, AMYLASE in the last 168 hours. No results for input(s): AMMONIA in the last 168 hours. Coagulation Profile:  Recent Labs Lab 11/20/16 1532 11/23/16 0448  INR 1.17 1.31   Cardiac Enzymes:  Recent Labs Lab 11/20/16 0527 11/20/16 1121 11/20/16 1532 11/20/16 2222  TROPONINI <0.03 <0.03 0.03* 0.03*   BNP (last 3 results) No results for input(s): PROBNP in the last 8760 hours. HbA1C:  Recent Labs  11/21/16 0541  HGBA1C 6.3*   CBG:  Recent Labs Lab 11/20/16 0524  GLUCAP 141*   Lipid Profile:  Recent Labs  11/21/16 0541  CHOL 144  HDL 43  LDLCALC 89  TRIG 60  CHOLHDL 3.3   Thyroid Function Tests: No results for input(s): TSH, T4TOTAL, FREET4, T3FREE, THYROIDAB in the last 72 hours. Anemia Panel: No results for input(s): VITAMINB12, FOLATE, FERRITIN, TIBC, IRON, RETICCTPCT in the last 72 hours. Urine analysis:    Component Value Date/Time   COLORURINE YELLOW 10/25/2015 [place] 10/25/2015 1658   LABSPEC 1.015 10/25/2015 1658   PHURINE 6.5 10/25/2015 1658   GLUCOSEU NEGATIVE 10/25/2015 1658   HGBUR NEGATIVE 10/25/2015 1658   BILIRUBINUR NEGATIVE 10/25/2015 1658   KETONESUR NEGATIVE 10/25/2015 1658   UROBILINOGEN 0.2 10/25/2015 1658   NITRITE POSITIVE (A) 10/25/2015 1658   LEUKOCYTESUR NEGATIVE 10/25/2015 1658   Sepsis Labs: Invalid input(s): PROCALCITONIN, LACTICIDVEN  No results found for this or any previous visit (from the past 240 hour(s)).    Radiology Studies: Dg Chest Port 1 View  Result Date:  11/21/2016 CLINICAL DATA:  Patient with shortness of breath. EXAM: PORTABLE CHEST 1 VIEW COMPARISON:  Chest radiograph 11/20/2016. FINDINGS: Monitoring leads overlie the patient. Stable cardiomegaly. Pulmonary vascular redistribution. Consolidation within the retrocardiac location. Possible small left pleural effusion. IMPRESSION: Cardiomegaly. Consolidation within the retrocardiac location favored to be secondary to atelectasis. Possible small left effusion. Electronically Signed   By: Lovey Newcomer M.D.   On: 11/21/2016 15:40     Scheduled Meds: .  stroke: mapping our early stages of recovery book   Does not apply Once  . aspirin  EC  81 mg Oral Daily  . enoxaparin (LOVENOX) injection  110 mg Subcutaneous Q12H  . furosemide  40 mg Intravenous BID  . magnesium oxide  400 mg Oral Daily  . mouth rinse  15 mL Mouth Rinse BID  . metoprolol tartrate  25 mg Oral BID  . rosuvastatin  10 mg Oral QHS  . sodium chloride flush  3 mL Intravenous Q12H  . warfarin  7.5 mg Oral ONCE-1800  . Warfarin - Pharmacist Dosing Inpatient   Does not apply q1800   Continuous Infusions:  Marzetta Board, MD, PhD [facility] Pager [phone]  If 7PM-7AM, please contact night-coverage [email or web address] Password [email or web address] 11/23/2016, 2:44 PM

## 2016-11-23 NOTE — Progress Notes (Signed)
ANTICOAGULATION CONSULT NOTE - Initial Consult  Pharmacy Consult for Enoxaparin/Warfarin Indication: atrial fibrillation and stroke  Assessment: 31 yoF admitted 12/28 with acute CVA. Patient has h/o persistent Afib, previously anticoagulated with Eliquis (CHADS2VASC 5). Since patient developed a stroke on Eliquis, she will be transitioned to warfarin in preparation for another cardioversion. Pharmacy has been consulted to dose enoxaparin and warfarin during this transition. Last dose of Eliquis was 0921 on 12/30, baseline INR 1.17.   Today, INR 1.31 (subtherapeutic), hemoglobin wnl, platelets low at 145, no bleeding noted. Aspirin dose has been decreased from 325 mg to 81 mg in light of increased bleed risk with warfarin, okay per Dr. Cruzita Lederer.   Goal of Therapy:  INR 2-3 Monitor platelets by anticoagulation protocol: Yes   Plan:  Enoxaparin 110 mg (1 mg/kg) SQ every 12 hours Warfarin 7.5 mg x 1 dose tonight Daily INR, CBC as needed, s/sx's bleeding Discontinue enoxaparin when INR > 2 + at least 5 days of overlap   Allergies  Allergen Reactions  . Ace Inhibitors Palpitations    Patient Measurements: Height: 5\' 5"  (165.1 cm) Weight: 242 lb (109.8 kg) IBW/kg (Calculated) : 57 Heparin Dosing Weight: 109.8 kg  Vital Signs: Temp: 99.1 F (37.3 C) (12/31 0543) Temp Source: Oral (12/31 0543) BP: 135/63 (12/31 0543) Pulse Rate: 74 (12/31 0543)  Labs:  Recent Labs  11/20/16 1121 11/20/16 1532 11/20/16 2222 11/21/16 0541 11/22/16 0326 11/23/16 0448  HGB  --   --   --  13.3  --  14.7  HCT  --   --   --  42.8  --  46.3*  PLT  --   --   --  160  --  145*  LABPROT  --  14.9  --   --   --  16.3*  INR  --  1.17  --   --   --  1.31  CREATININE  --   --   --  1.00 1.10* 1.01*  TROPONINI <0.03 0.03* 0.03*  --   --   --     Estimated Creatinine Clearance: 70.3 mL/min (by C-G formula based on SCr of 1.01 mg/dL (H)).   Medical History: Past Medical History:  Diagnosis Date  .  Arthritis   . CAD in native artery, s/p cardiac cath with non obstructive CAD 10/24/2016  . CHF (congestive heart failure) ([place])   . DIVERTICULITIS, HX OF 07/25/2007  . DIVERTICULOSIS, COLON 07/22/2007  . Dyspnea   . Edema, peripheral    a. chronic BLE edema, R>L. Prior trauma from dog attack and accident.  Marland Kitchen HYPERLIPIDEMIA 02/03/2008  . Hypersomnia    declines w/u  . Hypertension   . MENOPAUSAL DISORDER 01/09/2011  . Morbid obesity ([place]) 07/22/2007  . NICM (nonischemic cardiomyopathy) ([place]) 10/24/2016  . Raynaud's syndrome 07/22/2007  . THYROID NODULE, RIGHT 01/04/2010  . VITAMIN D DEFICIENCY 01/09/2011   Qualifier: Diagnosis of  By: [name], [name]     Medications:  Scheduled:  .  stroke: mapping our early stages of recovery book   Does not apply Once  . aspirin EC  81 mg Oral Daily  . enoxaparin (LOVENOX) injection  110 mg Subcutaneous Q12H  . furosemide  40 mg Intravenous BID  . magnesium oxide  400 mg Oral Daily  . mouth rinse  15 mL Mouth Rinse BID  . potassium chloride  40 mEq Oral Q3H  . rosuvastatin  10 mg Oral QHS  . sodium chloride flush  3 mL  Intravenous Q12H  . warfarin  7.5 mg Oral ONCE-1800  . Warfarin - Pharmacist Dosing Inpatient   Does not apply [id number]    Belia Heman, PharmD PGY1 Pharmacy Resident [phone] (Pager) 11/23/2016 8:10 AM

## 2016-11-23 NOTE — Progress Notes (Signed)
Subjective: Breathing is OK (on 4L )  No CP. Rates somewhat controlled at rest but in the 120-140s when ambulating.  Objective: Vitals:   11/23/16 0100 11/23/16 0543 11/23/16 1046 11/23/16 1133  BP: 121/67 135/63  138/90  Pulse: 82 74  (!) 58  Resp: 20 20  18   Temp: 98.6 F (37 C) 99.1 F (37.3 C)  98.6 F (37 C)  TempSrc: Oral Oral  Oral  SpO2: 98% 99%  98%  Weight:   227 lb 8 oz (103.2 kg)   Height:       Weight change:   Intake/Output Summary (Last 24 hours) at 11/23/16 1242 Last data filed at 11/22/16 1748  Gross per 24 hour  Intake                0 ml  Output              500 ml  Net             -500 ml    General: Alert, awake, oriented x3, in no acute distress Neck:  Difficult to assess JVP   Heart: Irregular rate and rhythm, without murmurs, rubs, gallops.  Lungs: MIld rales at R base   Exemities:  Tr to 1+ edema.   Neuro: Grossly intact, nonfocal.  Tele;  Afib    AVbera HR 70s to 90s    Lab Results: Results for orders placed or performed during the hospital encounter of 11/20/16 (from the past 24 hour(s))  Basic metabolic panel     Status: Abnormal   Collection Time: 11/23/16  4:48 AM  Result Value Ref Range   Sodium 139 135 - 145 mmol/L   Potassium 2.8 (L) 3.5 - 5.1 mmol/L   Chloride 90 (L) 101 - 111 mmol/L   CO2 39 (H) 22 - 32 mmol/L   Glucose, Bld 86 65 - 99 mg/dL   BUN 15 6 - 20 mg/dL   Creatinine, Ser 1.01 (H) 0.44 - 1.00 mg/dL   Calcium 8.7 (L) 8.9 - 10.3 mg/dL   GFR calc non Af Amer 58 (L) >60 mL/min   GFR calc Af Amer >60 >60 mL/min   Anion gap 10 5 - 15  Protime-INR     Status: Abnormal   Collection Time: 11/23/16  4:48 AM  Result Value Ref Range   Prothrombin Time 16.3 (H) 11.4 - 15.2 seconds   INR 1.31   CBC     Status: Abnormal   Collection Time: 11/23/16  4:48 AM  Result Value Ref Range   WBC 7.0 4.0 - 10.5 K/uL   RBC 5.06 3.87 - 5.11 MIL/uL   Hemoglobin 14.7 12.0 - 15.0 g/dL   HCT 46.3 (H) 36.0 - 46.0 %   MCV 91.5 78.0 -  100.0 fL   MCH 29.1 26.0 - 34.0 pg   MCHC 31.7 30.0 - 36.0 g/dL   RDW 13.4 11.5 - 15.5 %   Platelets 145 (L) 150 - 400 K/uL    Studies/Results: No results found.  Medications:REviewed      1  Persistent atrial fib  Has faled cardioversions  For now would recomm rate control  Wll need to consider amiodarone with possible attempt in future  Would like at least 4 wks of documented antiocoag. Starting 25 mg metoprolol BID for rate control. This can be adjusted as needed for improved control.  2  CVA  While on Eliquis  ? If she got all doses   As noted  by Dr Lovena Le , favor coumadin for now   2  Chrnic systolc CHF  LVEF 20 to 25%    Continue diuresis    3  Bradycardia  No recurrent spells    4  HTN: currently well controlled  LOS: 3 days   Patricia Wagner Meredith Leeds 11/23/2016, 12:42 PM

## 2016-11-24 LAB — CBC
HEMATOCRIT: 45.3 % (ref 36.0–46.0)
Hemoglobin: 14.3 g/dL (ref 12.0–15.0)
MCH: 29.1 pg (ref 26.0–34.0)
MCHC: 31.6 g/dL (ref 30.0–36.0)
MCV: 92.1 fL (ref 78.0–100.0)
PLATELETS: 139 10*3/uL — AB (ref 150–400)
RBC: 4.92 MIL/uL (ref 3.87–5.11)
RDW: 13.5 % (ref 11.5–15.5)
WBC: 6.4 10*3/uL (ref 4.0–10.5)

## 2016-11-24 LAB — BASIC METABOLIC PANEL
ANION GAP: 8 (ref 5–15)
BUN: 15 mg/dL (ref 6–20)
CALCIUM: 9.1 mg/dL (ref 8.9–10.3)
CO2: 40 mmol/L — AB (ref 22–32)
CREATININE: 1.06 mg/dL — AB (ref 0.44–1.00)
Chloride: 93 mmol/L — ABNORMAL LOW (ref 101–111)
GFR calc Af Amer: >60 mL/min (ref >60)
GFR, EST NON AFRICAN AMERICAN: 55 mL/min — AB (ref >60)
GLUCOSE: 99 mg/dL (ref 65–99)
Potassium: 3.4 mmol/L — ABNORMAL LOW (ref 3.5–5.1)
Sodium: 141 mmol/L (ref 135–145)

## 2016-11-24 LAB — PROTIME-INR
INR: 1.24
Prothrombin Time: 15.7 seconds — ABNORMAL HIGH (ref 11.4–15.2)

## 2016-11-24 MED ORDER — ENOXAPARIN SODIUM 100 MG/ML ~~LOC~~ SOLN
100.0000 mg | Freq: Two times a day (BID) | SUBCUTANEOUS | Status: DC
  Administered 2016-11-24 – 2016-11-25 (×3): 100 mg via SUBCUTANEOUS
  Filled 2016-11-24 (×3): qty 1

## 2016-11-24 MED ORDER — FUROSEMIDE 40 MG PO TABS
60.0000 mg | ORAL_TABLET | Freq: Every day | ORAL | Status: DC
  Administered 2016-11-25: 60 mg via ORAL
  Filled 2016-11-24: qty 1

## 2016-11-24 MED ORDER — WARFARIN SODIUM 5 MG PO TABS
10.0000 mg | ORAL_TABLET | Freq: Once | ORAL | Status: AC
  Administered 2016-11-24: 10 mg via ORAL
  Filled 2016-11-24: qty 2

## 2016-11-24 NOTE — Progress Notes (Signed)
ANTICOAGULATION CONSULT NOTE - Initial Consult  Pharmacy Consult for Enoxaparin/Warfarin Indication: atrial fibrillation and stroke  Assessment: 69 yoF admitted 12/28 with acute CVA. Patient has h/o persistent Afib, previously anticoagulated with Eliquis (CHADS2VASC 5). Since patient developed a stroke on Eliquis, she will be transitioned to warfarin in preparation for another cardioversion. Pharmacy has been consulted to dose enoxaparin and warfarin during this transition. Last dose of Eliquis was 0921 on 12/30, baseline INR 1.17.   Today, INR 1.24 (subtherapeutic), hemoglobin wnl, platelets low at 139, no bleeding noted. Enoxaparin dose decreased from 110mg  to 100mg  for weight loss.   Goal of Therapy:  INR 2-3 Monitor platelets by anticoagulation protocol: Yes   Plan:  Enoxaparin 100 mg (1 mg/kg) SQ every 12 hours Warfarin 7.5 mg x 1 dose tonight Daily INR, CBC as needed, s/sx's bleeding Discontinue enoxaparin when INR > 2 + at least 5 days of overlap   Allergies  Allergen Reactions  . Ace Inhibitors Palpitations    Patient Measurements: Height: 5\' 5"  (165.1 cm) Weight: 226 lb 1.6 oz (102.6 kg) IBW/kg (Calculated) : 57 Heparin Dosing Weight: 109.8 kg  Vital Signs: Temp: 98.8 F (37.1 C) (01/01 0520) Temp Source: Oral (01/01 0520) BP: 135/78 (01/01 0520) Pulse Rate: 83 (01/01 0520)  Labs:  Recent Labs  11/22/16 0326 11/23/16 0448 11/24/16 0519  HGB  --  14.7 14.3  HCT  --  46.3* 45.3  PLT  --  145* 139*  LABPROT  --  16.3* 15.7*  INR  --  1.31 1.24  CREATININE 1.10* 1.01* 1.06*    Estimated Creatinine Clearance: 64.5 mL/min (by C-G formula based on SCr of 1.06 mg/dL (H)).   Medical History: Past Medical History:  Diagnosis Date  . Arthritis   . CAD in native artery, s/p cardiac cath with non obstructive CAD 10/24/2016  . CHF (congestive heart failure) ([place])   . DIVERTICULITIS, HX OF 07/25/2007  . DIVERTICULOSIS, COLON 07/22/2007  . Dyspnea   . Edema,  peripheral    a. chronic BLE edema, R>L. Prior trauma from dog attack and accident.  Marland Kitchen HYPERLIPIDEMIA 02/03/2008  . Hypersomnia    declines w/u  . Hypertension   . MENOPAUSAL DISORDER 01/09/2011  . Morbid obesity ([place]) 07/22/2007  . NICM (nonischemic cardiomyopathy) ([place]) 10/24/2016  . Raynaud's syndrome 07/22/2007  . THYROID NODULE, RIGHT 01/04/2010  . VITAMIN D DEFICIENCY 01/09/2011   Qualifier: Diagnosis of  By: [name], [name]     Medications:  Scheduled:  .  stroke: mapping our early stages of recovery book   Does not apply Once  . aspirin EC  81 mg Oral Daily  . enoxaparin (LOVENOX) injection  100 mg Subcutaneous Q12H  . furosemide  40 mg Intravenous BID  . magnesium oxide  400 mg Oral Daily  . mouth rinse  15 mL Mouth Rinse BID  . metoprolol tartrate  25 mg Oral BID  . rosuvastatin  10 mg Oral QHS  . sodium chloride flush  3 mL Intravenous Q12H  . warfarin  10 mg Oral ONCE-1800  . Warfarin - Pharmacist Dosing Inpatient   Does not apply [id number]    Belia Heman, PharmD PGY1 Pharmacy Resident [phone] (Pager) 11/24/2016 8:29 AM

## 2016-11-24 NOTE — Discharge Instructions (Signed)
Information on my medicine - Coumadin   (Warfarin)  This medication education was reviewed with me or my healthcare representative as part of my discharge preparation.  The pharmacist that spoke with me during my hospital stay was:  Kai Levins, [facility]  Why was Coumadin prescribed for you? Coumadin was prescribed for you because you have a blood clot or a medical condition that can cause an increased risk of forming blood clots. Blood clots can cause serious health problems by blocking the flow of blood to the heart, lung, or brain. Coumadin can prevent harmful blood clots from forming. As a reminder your indication for Coumadin is:   Select from menu  What test will check on my response to Coumadin? While on Coumadin (warfarin) you will need to have an INR test regularly to ensure that your dose is keeping you in the desired range. The INR (international normalized ratio) number is calculated from the result of the laboratory test called prothrombin time (PT).  If an INR APPOINTMENT HAS NOT ALREADY BEEN MADE FOR YOU please schedule an appointment to have this lab work done by your health care provider within 7 days. Your INR goal is usually a number between:  2 to 3 or your provider may give you a more narrow range like 2-2.5.  Ask your health care provider during an office visit what your goal INR is.  What  do you need to  know  About  COUMADIN? Take Coumadin (warfarin) exactly as prescribed by your healthcare provider about the same time each day.  DO NOT stop taking without talking to the doctor who prescribed the medication.  Stopping without other blood clot prevention medication to take the place of Coumadin may increase your risk of developing a new clot or stroke.  Get refills before you run out.  What do you do if you miss a dose? If you miss a dose, take it as soon as you remember on the same day then continue your regularly scheduled regimen the next day.  Do not take two doses of  Coumadin at the same time.  Important Safety Information A possible side effect of Coumadin (Warfarin) is an increased risk of bleeding. You should call your healthcare provider right away if you experience any of the following: ? Bleeding from an injury or your nose that does not stop. ? Unusual colored urine (red or dark brown) or unusual colored stools (red or black). ? Unusual bruising for unknown reasons. ? A serious fall or if you hit your head (even if there is no bleeding).  Some foods or medicines interact with Coumadin (warfarin) and might alter your response to warfarin. To help avoid this: ? Eat a balanced diet, maintaining a consistent amount of Vitamin K. ? Notify your provider about major diet changes you plan to make. ? Avoid alcohol or limit your intake to 1 drink for women and 2 drinks for men per day. (1 drink is 5 oz. wine, 12 oz. beer, or 1.5 oz. liquor.)  Make sure that ANY health care provider who prescribes medication for you knows that you are taking Coumadin (warfarin).  Also make sure the healthcare provider who is monitoring your Coumadin knows when you have started a new medication including herbals and non-prescription products.  Coumadin (Warfarin)  Major Drug Interactions  Increased Warfarin Effect Decreased Warfarin Effect  Alcohol (large quantities) Antibiotics (esp. Septra/Bactrim, Flagyl, Cipro) Amiodarone (Cordarone) Aspirin (ASA) Cimetidine (Tagamet) Megestrol (Megace) NSAIDs (ibuprofen, naproxen, etc.) Piroxicam (  Feldene) °Propafenone (Rythmol SR) °Propranolol (Inderal) °Isoniazid (INH) °Posaconazole (Noxafil) Barbiturates (Phenobarbital) °Carbamazepine (Tegretol) °Chlordiazepoxide (Librium) °Cholestyramine (Questran) °Griseofulvin °Oral Contraceptives °Rifampin °Sucralfate (Carafate) °Vitamin K  ° °Coumadin® (Warfarin) Major Herbal Interactions  °Increased Warfarin Effect Decreased Warfarin Effect  °Garlic °Ginseng °Ginkgo biloba Coenzyme Q10 °Green  tea °St. John’s wort   ° °Coumadin® (Warfarin) FOOD Interactions  °Eat a consistent number of servings per week of foods HIGH in Vitamin K °(1 serving = ½ cup)  °Collards (cooked, or boiled & drained) °Kale (cooked, or boiled & drained) °Mustard greens (cooked, or boiled & drained) °Parsley *serving size only = ¼ cup °Spinach (cooked, or boiled & drained) °Swiss chard (cooked, or boiled & drained) °Turnip greens (cooked, or boiled & drained)  °Eat a consistent number of servings per week of foods MEDIUM-HIGH in Vitamin K °(1 serving = 1 cup)  °Asparagus (cooked, or boiled & drained) °Broccoli (cooked, boiled & drained, or raw & chopped) °Brussel sprouts (cooked, or boiled & drained) *serving size only = ½ cup °Lettuce, raw (green leaf, endive, romaine) °Spinach, raw °Turnip greens, raw & chopped  ° °These websites have more information on Coumadin (warfarin):  www.coumadin.com; °www.ahrq.gov/consumer/coumadin.htm; ° ° ° °

## 2016-11-24 NOTE — Progress Notes (Signed)
Patient Name: Patricia Wagner Date of Encounter: 11/24/2016     Active Problems:   HLD (hyperlipidemia)   Essential hypertension   CKD (chronic kidney disease), stage III   Paroxysmal atrial fibrillation (HCC)   Bradycardia   Stroke (HCC)   Chronic systolic congestive heart failure ([place])    SUBJECTIVE  No chest pain or sob. "I wanna get out of here".   CURRENT MEDS .  stroke: mapping our early stages of recovery book   Does not apply Once  . aspirin EC  81 mg Oral Daily  . enoxaparin (LOVENOX) injection  100 mg Subcutaneous Q12H  . furosemide  40 mg Intravenous BID  . magnesium oxide  400 mg Oral Daily  . mouth rinse  15 mL Mouth Rinse BID  . metoprolol tartrate  25 mg Oral BID  . rosuvastatin  10 mg Oral QHS  . sodium chloride flush  3 mL Intravenous Q12H  . warfarin  10 mg Oral ONCE-1800  . Warfarin - Pharmacist Dosing Inpatient   Does not apply q1800    OBJECTIVE  Vitals:   11/24/16 0231 11/24/16 0500 11/24/16 0520 11/24/16 0849  BP: 131/72  135/78 (!) (P) 135/95  Pulse: 72  83 (P) 92  Resp: 18  18 (P) 20  Temp: 98.6 F (37 C)  98.8 F (37.1 C) (P) 99.1 F (37.3 C)  TempSrc: Oral  Oral (P) Oral  SpO2: 99%  99% (P) 94%  Weight:  226 lb 1.6 oz (102.6 kg)    Height:        Intake/Output Summary (Last 24 hours) at 11/24/16 1032 Last data filed at 11/24/16 0843  Gross per 24 hour  Intake              480 ml  Output                0 ml  Net              480 ml   Filed Weights   11/20/16 0522 11/23/16 1046 11/24/16 0500  Weight: 242 lb (109.8 kg) 227 lb 8 oz (103.2 kg) 226 lb 1.6 oz (102.6 kg)    PHYSICAL EXAM  General: Pleasant, NAD. Neuro: Alert and oriented X 3. Moves all extremities spontaneously. Psych: Normal affect. HEENT:  Normal  Neck: Supple without bruits or JVD. Lungs:  Resp regular and unlabored, CTA. Heart: IRRR no s3, s4, or murmurs. Abdomen: Soft, non-tender, non-distended, BS + x 4.  Extremities: No clubbing, cyanosis or edema.  DP/PT/Radials 2+ and equal bilaterally.  Accessory Clinical Findings  CBC  Recent Labs  11/23/16 0448 11/24/16 0519  WBC 7.0 6.4  HGB 14.7 14.3  HCT 46.3* 45.3  MCV 91.5 92.1  PLT 145* 384*   Basic Metabolic Panel  Recent Labs  11/23/16 0448 11/24/16 0519  NA 139 141  K 2.8* 3.4*  CL 90* 93*  CO2 39* 40*  GLUCOSE 86 99  BUN 15 15  CREATININE 1.01* 1.06*  CALCIUM 8.7* 9.1   Liver Function Tests No results for input(s): AST, ALT, ALKPHOS, BILITOT, PROT, ALBUMIN in the last 72 hours. No results for input(s): LIPASE, AMYLASE in the last 72 hours. Cardiac Enzymes No results for input(s): CKTOTAL, CKMB, CKMBINDEX, TROPONINI in the last 72 hours. BNP Invalid input(s): POCBNP D-Dimer No results for input(s): DDIMER in the last 72 hours. Hemoglobin A1C No results for input(s): HGBA1C in the last 72 hours. Fasting Lipid Panel No results for input(s):  CHOL, HDL, LDLCALC, TRIG, CHOLHDL, LDLDIRECT in the last 72 hours. Thyroid Function Tests No results for input(s): TSH, T4TOTAL, T3FREE, THYROIDAB in the last 72 hours.  Invalid input(s): FREET3  TELE  Atrial fib with a controlled Vr.  Radiology/Studies  Ct Angio Head W/cm &/or Wo Cm  Result Date: 11/20/2016 CLINICAL DATA:  Headache at the level the left eye beginning today. Dizziness. Stroke. Left superior cerebellar infarct. EXAM: CT ANGIOGRAPHY HEAD AND NECK TECHNIQUE: Multidetector CT imaging of the head and neck was performed using the standard protocol during bolus administration of intravenous contrast. Multiplanar CT image reconstructions and MIPs were obtained to evaluate the vascular anatomy. Carotid stenosis measurements (when applicable) are obtained utilizing NASCET criteria, using the distal internal carotid diameter as the denominator. CONTRAST:  50 mL Isovue 370 COMPARISON:  MRI of the brain from the same day. FINDINGS: CTA NECK FINDINGS Aortic arch: Study is somewhat degraded by patient motion.  Atherosclerotic calcifications are present at the aortic arch without significant stenosis. Right carotid system: The right common carotid artery demonstrates mild tortuosity without significant stenosis. The bifurcation is unremarkable. There is mild tortuosity within the cervical right ICA is well. No focal stenosis present. Left carotid system: The left common carotid artery is within normal limits. Calcifications are present at the left carotid bifurcation without significant stenosis. The cervical left ICA is unremarkable. Vertebral arteries: The right vertebral artery originates from the subclavian artery. The origin is obscured by artifact. The left vertebral artery is not visualized proximally is likely occluded. It is reconstituted at the C2 level via musculoskeletal branches. There is no significant focal stenosis of the right vertebral artery in the neck. Skeleton: Multilevel endplate changes are present throughout cervical spine. Uncovertebral spurring contributes to osseous foraminal narrowing most evident at C4-5 and C5-6, left greater than right. Other neck: The soft tissues the neck are unremarkable. No significant adenopathy is present. Upper chest: The lung apices demonstrate mild dependent atelectasis. No focal nodule or mass lesion is present. The superior mediastinum is otherwise unremarkable. The thoracic inlet is within normal limits. Review of the MIP images confirms the above findings CTA HEAD FINDINGS Anterior circulation: Atherosclerotic calcifications are present within the cavernous internal carotid arteries bilaterally without a significant stenosis through the ICA termini bilaterally. The A1 and M1 segments are normal. The MCA bifurcations are intact bilaterally. The anterior communicating artery is patent. Mild distal small vessel disease is present without a significant proximal stenosis or occlusion. Posterior circulation: The right vertebral artery is dominant to left.  Calcifications are present at dural margin of the right vertebral artery without a significant stenosis. The vertebrobasilar junction is intact. The PICA origins are visualized and normal. The left superior cerebellar artery is occluded, consistent with the area of infarction. The left PICA is engorged. This likely represents luxury perfusion. Both posterior cerebral arteries originate from the basilar tip. Both posterior cerebral arteries originate from the basilar tip. The PCA branch vessels are within normal limits bilaterally. Venous sinuses: The dural sinuses are patent. The right transverse sinus is dominant. The straight sinus and deep cerebral veins are intact. Cortical veins are unremarkable. Anatomic variants: None Delayed phase: The postcontrast images demonstrate no pathologic enhancement. The left superior cerebellar infarct is again noted. Review of the MIP images confirms the above findings IMPRESSION: 1. Left superior cerebellar nonhemorrhagic infarct. 2. Occlusion of the left superior cerebellar artery. 3. Mild engorgement of the left posterior inferior cerebellar artery likely representing luxury perfusion. 4. Atherosclerotic calcifications at the  dural margin of the right vertebral artery in both cavernous internal carotid arteries without significant stenosis. 5. Mild atherosclerotic changes of the left carotid bifurcation and at the aortic arch without significant stenosis. 6. Multilevel spondylosis of the cervical spine is most evident at C4-5 and C5-6. Electronically Signed   By: San Morelle M.D.   On: 11/20/2016 12:13   Ct Head Wo Contrast  Result Date: 11/20/2016 CLINICAL DATA:  Headache behind left eye.  Dizziness. EXAM: CT HEAD WITHOUT CONTRAST TECHNIQUE: Contiguous axial images were obtained from the base of the skull through the vertex without intravenous contrast. COMPARISON:  None. FINDINGS: Brain: No mass lesion, intraparenchymal hemorrhage or extra-axial collection. No  evidence of acute cortical infarct. Brain parenchyma and CSF-containing spaces are normal for age. Vascular: Atherosclerotic calcification of the vertebral and internal carotid arteries at the skullbase. Skull: Normal visualized skull base, calvarium and extracranial soft tissues. Sinuses/Orbits: No sinus fluid levels or advanced mucosal thickening. No mastoid effusion. Normal orbits. IMPRESSION: No acute intracranial abnormality. Electronically Signed   By: Ulyses Jarred M.D.   On: 11/20/2016 06:21   Ct Angio Neck W/cm &/or Wo/cm  Result Date: 11/20/2016 CLINICAL DATA:  Headache at the level the left eye beginning today. Dizziness. Stroke. Left superior cerebellar infarct. EXAM: CT ANGIOGRAPHY HEAD AND NECK TECHNIQUE: Multidetector CT imaging of the head and neck was performed using the standard protocol during bolus administration of intravenous contrast. Multiplanar CT image reconstructions and MIPs were obtained to evaluate the vascular anatomy. Carotid stenosis measurements (when applicable) are obtained utilizing NASCET criteria, using the distal internal carotid diameter as the denominator. CONTRAST:  50 mL Isovue 370 COMPARISON:  MRI of the brain from the same day. FINDINGS: CTA NECK FINDINGS Aortic arch: Study is somewhat degraded by patient motion. Atherosclerotic calcifications are present at the aortic arch without significant stenosis. Right carotid system: The right common carotid artery demonstrates mild tortuosity without significant stenosis. The bifurcation is unremarkable. There is mild tortuosity within the cervical right ICA is well. No focal stenosis present. Left carotid system: The left common carotid artery is within normal limits. Calcifications are present at the left carotid bifurcation without significant stenosis. The cervical left ICA is unremarkable. Vertebral arteries: The right vertebral artery originates from the subclavian artery. The origin is obscured by artifact. The left  vertebral artery is not visualized proximally is likely occluded. It is reconstituted at the C2 level via musculoskeletal branches. There is no significant focal stenosis of the right vertebral artery in the neck. Skeleton: Multilevel endplate changes are present throughout cervical spine. Uncovertebral spurring contributes to osseous foraminal narrowing most evident at C4-5 and C5-6, left greater than right. Other neck: The soft tissues the neck are unremarkable. No significant adenopathy is present. Upper chest: The lung apices demonstrate mild dependent atelectasis. No focal nodule or mass lesion is present. The superior mediastinum is otherwise unremarkable. The thoracic inlet is within normal limits. Review of the MIP images confirms the above findings CTA HEAD FINDINGS Anterior circulation: Atherosclerotic calcifications are present within the cavernous internal carotid arteries bilaterally without a significant stenosis through the ICA termini bilaterally. The A1 and M1 segments are normal. The MCA bifurcations are intact bilaterally. The anterior communicating artery is patent. Mild distal small vessel disease is present without a significant proximal stenosis or occlusion. Posterior circulation: The right vertebral artery is dominant to left. Calcifications are present at dural margin of the right vertebral artery without a significant stenosis. The vertebrobasilar junction is intact. The  PICA origins are visualized and normal. The left superior cerebellar artery is occluded, consistent with the area of infarction. The left PICA is engorged. This likely represents luxury perfusion. Both posterior cerebral arteries originate from the basilar tip. Both posterior cerebral arteries originate from the basilar tip. The PCA branch vessels are within normal limits bilaterally. Venous sinuses: The dural sinuses are patent. The right transverse sinus is dominant. The straight sinus and deep cerebral veins are intact.  Cortical veins are unremarkable. Anatomic variants: None Delayed phase: The postcontrast images demonstrate no pathologic enhancement. The left superior cerebellar infarct is again noted. Review of the MIP images confirms the above findings IMPRESSION: 1. Left superior cerebellar nonhemorrhagic infarct. 2. Occlusion of the left superior cerebellar artery. 3. Mild engorgement of the left posterior inferior cerebellar artery likely representing luxury perfusion. 4. Atherosclerotic calcifications at the dural margin of the right vertebral artery in both cavernous internal carotid arteries without significant stenosis. 5. Mild atherosclerotic changes of the left carotid bifurcation and at the aortic arch without significant stenosis. 6. Multilevel spondylosis of the cervical spine is most evident at C4-5 and C5-6. Electronically Signed   By: San Morelle M.D.   On: 11/20/2016 12:13   Mr Brain Wo Contrast  Result Date: 11/20/2016 CLINICAL DATA:  New onset lightheadedness and dizziness. Nausea, vomiting, and diarrhea. EXAM: MRI HEAD WITHOUT CONTRAST TECHNIQUE: Multiplanar, multiecho pulse sequences of the brain and surrounding structures were obtained without intravenous contrast. COMPARISON:  CT head without contrast 11/20/2016 FINDINGS: Brain: An acute/subacute nonhemorrhagic infarct is evident within the left superior cerebellum. No acute supratentorial infarct is present. Subtle T2 changes are associated with the infarct suggesting a more acute timeframe. A remote cortical infarct is present in the right occipital lobe medially. No other focal infarct is present. Mild periventricular T2 changes are within normal limits for age. The ventricles are normal size. No significant extra-axial fluid collection is present. Vascular: Flow is present in the major intracranial arteries. 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 The skullbase is otherwise within normal limits. The  sella turcica is enlarged. No mass lesion is present. Midline sagittal structures are otherwise unremarkable. Degenerative changes are noted in the upper cervical spine with moderate cervical stenosis suggested at C4-5. Sinuses/Orbits: The paranasal sinuses and the mastoid air cells are clear. The globes and orbits are within normal limits. IMPRESSION: 1. Acute nonhemorrhagic left superior cerebellar infarct. 2. Normal flow is present within the major arteries of the circle of Willis. 3. Remote right occipital lobe infarct. Electronically Signed   By: San Morelle M.D.   On: 11/20/2016 08:41   Dg Chest Port 1 View  Result Date: 11/21/2016 CLINICAL DATA:  Patient with shortness of breath. EXAM: PORTABLE CHEST 1 VIEW COMPARISON:  Chest radiograph 11/20/2016. FINDINGS: Monitoring leads overlie the patient. Stable cardiomegaly. Pulmonary vascular redistribution. Consolidation within the retrocardiac location. Possible small left pleural effusion. IMPRESSION: Cardiomegaly. Consolidation within the retrocardiac location favored to be secondary to atelectasis. Possible small left effusion. Electronically Signed   By: Lovey Newcomer M.D.   On: 11/21/2016 15:40   Dg Chest Port 1 View  Result Date: 11/20/2016 CLINICAL DATA:  Shortness of breath. EXAM: PORTABLE CHEST 1 VIEW COMPARISON:  10/20/2016 and 05/04/2008 FINDINGS: There is cardiomegaly and aortic atherosclerosis. There is new pulmonary vascular congestion. No discrete effusions. IMPRESSION: Findings consistent with congestive heart failure with pulmonary vascular congestion and cardiomegaly. Aortic atherosclerosis. Electronically Signed   By: Lorriane Shire M.D.  On: 11/20/2016 11:34    ASSESSMENT AND PLAN  1. Stroke - she appears to have failed [place]. I recommend warfarin so that we can monitor her level of anti-coag 2. Bradycardia - she was on multiple high doses of AV nodal blocking agents. Her rates are better with low dose metoprolol. 3.  Atrial fib - her rates are controlled. I would favor rate control going forward.   Carleene Overlie Patricia Wagner,M.D.  11/24/2016 10:32 AMPatient ID: Patricia Wagner, female   DOB: 1951-05-04, 66 y.o.   MRN: [id number]

## 2016-11-24 NOTE — Progress Notes (Signed)
PROGRESS NOTE  Patricia Wagner:[id number] DOB: Apr 20, 1951 DOA: 11/20/2016 PCP: Cathlean Cower, MD   LOS: 4 days   Brief Narrative: 66 y.o. female with medical history significant of CAD, systolic CHF, diverticulosis, HLD, morbid obesity atrial fibrillation,  who was admitted to the hospital on 12/28 with left-sided headache, dizziness, episode of nausea and vomiting. MRI on admission showed acute CVA, and neurology was consulted and she was admitted to telemetry. She was also bradycardic on admission and cardiology was consulted.   Assessment & Plan: Active Problems:   HLD (hyperlipidemia)   Essential hypertension   CKD (chronic kidney disease), stage III   Paroxysmal atrial fibrillation (HCC)   Bradycardia   Stroke (HCC)   Chronic systolic congestive heart failure (HCC)   Acute CVA  - Noted on MRI, acute nonhemorrhagic left superior cerebellar infarct  - neurology consulted, appreciate input  - 2-D echo with EF of 20-25% - ? Eliquis failure, switched to Coumadin with Lovenox bridging  Paroxysmal A. Fib / symptomatic bradycardia - Her AV nodal agents were held, she is back in A. Fib which is rate controlled at rest, however spikes in the 130s 140s with ambulation. Metoprolol added per cardiology. - She apparently underwent 3 cardioversions in the past in attempts to maintain sinus rhythm and she is quite symptomatic with A. fib - EP consulted - Rate controlled for now  Acute on chronic systolic CHF with acute hypoxic respiratory failure - Chest x-ray on admission yesterday showed pulmonary vascular congestion and cardiomegaly, patient is hypoxic on room air requiring supplemental oxygen, she is getting diuresed with Lasix twice daily, she appears comfortable, strict I's and O's and daily weights, wean off oxygen as tolerated - big contributor is presence of A. Fib - Continue diuresis, stop IV Lasix and transition her to by mouth today   DVT prophylaxis: Lovenox / Coumadin  Code  Status: Full code Family Communication: no family bedside Disposition Plan: CIR versus home tomorrow  Consultants:   Cardiology  EP  Neurology   Procedures:   2D echo  Antimicrobials:  None    Subjective: - no chest pain, shortness of breath, no abdominal pain, nausea or vomiting. No palpitations or chest pain  Objective: Vitals:   11/24/16 0231 11/24/16 0500 11/24/16 0520 11/24/16 0849  BP: 131/72  135/78 (!) (P) 135/95  Pulse: 72  83 (P) 92  Resp: 18  18 (P) 20  Temp: 98.6 F (37 C)  98.8 F (37.1 C) (P) 99.1 F (37.3 C)  TempSrc: Oral  Oral (P) Oral  SpO2: 99%  99% (P) 94%  Weight:  102.6 kg (226 lb 1.6 oz)    Height:        Intake/Output Summary (Last 24 hours) at 11/24/16 1316 Last data filed at 11/24/16 0843  Gross per 24 hour  Intake              480 ml  Output                0 ml  Net              480 ml   Filed Weights   11/20/16 0522 11/23/16 1046 11/24/16 0500  Weight: 109.8 kg (242 lb) 103.2 kg (227 lb 8 oz) 102.6 kg (226 lb 1.6 oz)    Examination: Constitutional: NAD Vitals:   11/24/16 0231 11/24/16 0500 11/24/16 0520 11/24/16 0849  BP: 131/72  135/78 (!) (P) 135/95  Pulse: 72  83 (P) 92  Resp:  18  18 (P) 20  Temp: 98.6 F (37 C)  98.8 F (37.1 C) (P) 99.1 F (37.3 C)  TempSrc: Oral  Oral (P) Oral  SpO2: 99%  99% (P) 94%  Weight:  102.6 kg (226 lb 1.6 oz)    Height:       Eyes: PERRL, lids and conjunctivae normal Respiratory: clear to auscultation bilaterally, no wheezing, no crackles.  Cardiovascular: Regular rate and rhythm, no murmurs / rubs / gallops.  Abdomen: no tenderness. Bowel sounds positive.  Neurologic: CN 2-12 grossly intact. Strength 5/5 in all 4.  Psychiatric: Normal judgment and insight. Alert and oriented x 3. Normal mood.    Data Reviewed: I have personally reviewed following labs and imaging studies  CBC:  Recent Labs Lab 11/20/16 0527 11/21/16 0541 11/23/16 0448 11/24/16 0519  WBC 9.5 7.4 7.0 6.4    HGB 13.6 13.3 14.7 14.3  HCT 43.6 42.8 46.3* 45.3  MCV 93.2 93.7 91.5 92.1  PLT 167 160 145* 604*   Basic Metabolic Panel:  Recent Labs Lab 11/20/16 0527 11/21/16 0541 11/22/16 0326 11/23/16 0448 11/24/16 0519  NA 139 142 141 139 141  K 3.6 3.9 3.5 2.8* 3.4*  CL 101 100* 94* 90* 93*  CO2 32 35* 37* 39* 40*  GLUCOSE 177* 98 92 86 99  BUN 22* 14 15 15 15   CREATININE 1.14* 1.00 1.10* 1.01* 1.06*  CALCIUM 8.8* 8.9 8.8* 8.7* 9.1   GFR: Estimated Creatinine Clearance: 64.5 mL/min (by C-G formula based on SCr of 1.06 mg/dL (H)). Liver Function Tests:  Recent Labs Lab 11/20/16 0527  AST 65*  ALT 51  ALKPHOS 60  BILITOT 0.6  PROT 6.6  ALBUMIN 3.1*   No results for input(s): LIPASE, AMYLASE in the last 168 hours. No results for input(s): AMMONIA in the last 168 hours. Coagulation Profile:  Recent Labs Lab 11/20/16 1532 11/23/16 0448 11/24/16 0519  INR 1.17 1.31 1.24   Cardiac Enzymes:  Recent Labs Lab 11/20/16 0527 11/20/16 1121 11/20/16 1532 11/20/16 2222  TROPONINI <0.03 <0.03 0.03* 0.03*   BNP (last 3 results) No results for input(s): PROBNP in the last 8760 hours. HbA1C: No results for input(s): HGBA1C in the last 72 hours. CBG:  Recent Labs Lab 11/20/16 0524  GLUCAP 141*   Lipid Profile: No results for input(s): CHOL, HDL, LDLCALC, TRIG, CHOLHDL, LDLDIRECT in the last 72 hours. Thyroid Function Tests: No results for input(s): TSH, T4TOTAL, FREET4, T3FREE, THYROIDAB in the last 72 hours. Anemia Panel: No results for input(s): VITAMINB12, FOLATE, FERRITIN, TIBC, IRON, RETICCTPCT in the last 72 hours. Urine analysis:    Component Value Date/Time   COLORURINE YELLOW 11/23/2016 1530   APPEARANCEUR CLOUDY (A) 11/23/2016 1530   LABSPEC 1.008 11/23/2016 1530   PHURINE 8.0 11/23/2016 1530   GLUCOSEU NEGATIVE 11/23/2016 1530   GLUCOSEU NEGATIVE 10/25/2015 1658   HGBUR SMALL (A) 11/23/2016 1530   BILIRUBINUR NEGATIVE 11/23/2016 [place] 11/23/2016 1530   PROTEINUR NEGATIVE 11/23/2016 1530   UROBILINOGEN 0.2 10/25/2015 1658   NITRITE NEGATIVE 11/23/2016 1530   LEUKOCYTESUR NEGATIVE 11/23/2016 1530   Sepsis Labs: Invalid input(s): PROCALCITONIN, LACTICIDVEN  No results found for this or any previous visit (from the past 240 hour(s)).    Radiology Studies: No results found.   Scheduled Meds: .  stroke: mapping our early stages of recovery book   Does not apply Once  . aspirin EC  81 mg Oral Daily  . enoxaparin (LOVENOX) injection  100  mg Subcutaneous Q12H  . [START ON 11/25/2016] furosemide  60 mg Oral Daily  . magnesium oxide  400 mg Oral Daily  . mouth rinse  15 mL Mouth Rinse BID  . metoprolol tartrate  25 mg Oral BID  . rosuvastatin  10 mg Oral QHS  . sodium chloride flush  3 mL Intravenous Q12H  . warfarin  10 mg Oral ONCE-1800  . Warfarin - Pharmacist Dosing Inpatient   Does not apply q1800   Continuous Infusions:  Marzetta Board, MD, PhD [facility] Pager [phone]  If 7PM-7AM, please contact night-coverage [email or web address] Password [email or web address] 11/24/2016, 1:16 PM

## 2016-11-24 NOTE — Progress Notes (Signed)
Assisted patient with hygiene and ambulating back to chair after patient was able to have a bowel movement on the bedside commode. Patient appeared to ambulate well with a walker. RN was told by previous nurse, Richie, that she moves well to the bedside commode. Patient stated that she did not want to work with physical therapy and was ready to return home. RN educated patient on the importance of safety and that it is important that she continues to use the walker for the time being.

## 2016-11-25 DIAGNOSIS — I481 Persistent atrial fibrillation: Secondary | ICD-10-CM

## 2016-11-25 LAB — BASIC METABOLIC PANEL
Anion gap: 14 (ref 5–15)
BUN: 17 mg/dL (ref 6–20)
CHLORIDE: 90 mmol/L — AB (ref 101–111)
CO2: 35 mmol/L — ABNORMAL HIGH (ref 22–32)
Calcium: 9.3 mg/dL (ref 8.9–10.3)
Creatinine, Ser: 1.26 mg/dL — ABNORMAL HIGH (ref 0.44–1.00)
GFR calc Af Amer: 51 mL/min — ABNORMAL LOW (ref >60)
GFR calc non Af Amer: 44 mL/min — ABNORMAL LOW (ref >60)
GLUCOSE: 122 mg/dL — AB (ref 65–99)
POTASSIUM: 3.3 mmol/L — AB (ref 3.5–5.1)
Sodium: 139 mmol/L (ref 135–145)

## 2016-11-25 LAB — PROTIME-INR
INR: 1.52
Prothrombin Time: 18.4 seconds — ABNORMAL HIGH (ref 11.4–15.2)

## 2016-11-25 MED ORDER — WARFARIN SODIUM 5 MG PO TABS: 10.0000 mg | ORAL_TABLET | Freq: Once | ORAL | Status: DC

## 2016-11-25 MED ORDER — ENOXAPARIN SODIUM 100 MG/ML ~~LOC~~ SOLN: 100.0000 mg | Freq: Two times a day (BID) | SUBCUTANEOUS | 0 refills | Status: DC

## 2016-11-25 MED ORDER — WARFARIN SODIUM 7.5 MG PO TABS: 7.5000 mg | ORAL_TABLET | Freq: Every day | ORAL | 0 refills | Status: DC

## 2016-11-25 MED ORDER — METOPROLOL TARTRATE 25 MG PO TABS: 25.0000 mg | ORAL_TABLET | Freq: Two times a day (BID) | ORAL | 1 refills | Status: DC

## 2016-11-25 MED ORDER — ASPIRIN 81 MG PO TBEC: 81.0000 mg | DELAYED_RELEASE_TABLET | Freq: Every day | ORAL | 1 refills | Status: DC

## 2016-11-25 NOTE — Care Management Note (Signed)
Case Management Note  Patient Details  Name: Patricia Wagner MRN: [id number] Date of Birth: 1951-11-13  Subjective/Objective:   Pt admitted with CVA. She is from home alone.                  Action/Plan: Patient decided she did not want CIR and is requesting to discharge home with [facility] services.  Pt with [employer]. CM went through [place] with her [employer] and faxed them the information they requested for [facility] RN,PT and DME. They called and confirmed services and stated that [facility] will provide the DME and [facility] will provide her [facility] services.  Pt also discharging home on lovenox. CM spoke to [place] and patient has not met her deductible. The cost of the lovenox is $135 for the 10 doses. CM met with the patient and she states she can afford this. MD informed. Bedside RN updated.    Expected Discharge Date:                  Expected Discharge Plan:  [place]  In-House Referral:     Discharge planning Services  CM Consult  Post Acute Care Choice:  Durable Medical Equipment, Home Health Choice offered to:  Patient  DME Arranged:  3-N-1, Walker rolling DME Agency:   ([facility])  HH Arranged:  RN, PT HH Agency:   ([facility])  Status of Service:  Completed, signed off  If discussed at [place] of Stay Meetings, dates discussed:    Additional Comments:  Pollie Friar, RN 11/25/2016, 1:44 PM

## 2016-11-25 NOTE — Progress Notes (Signed)
Occupational Therapy Treatment Patient Details Name: Patricia Wagner MRN: [id number] DOB: 10-11-1951 Today's Date: 11/25/2016    History of present illness 66 yo female admitted with L facial droop and slurred speech MRI (+) L superior cerebellar hemisphere ischemic infarct . Pt with recent admission for AFib RVR and under went TEE and x2 cardioversion ( 12/11 and 12/26)   OT comments  Pt overall performing at a supervision level in ADL and mobility in this setting. Educated pt and daughter in home safety, multiples uses of 3 in 1. Pt is adamant she wants to go home with [facility] therapies, daughter and granddaughter will supervise.  Follow Up Recommendations  Home health OT;Supervision/Assistance - 24 hour (Pt is refusing CIR)    Equipment Recommendations  3 in 1 bedside commode    Recommendations for Other Services      Precautions / Restrictions Precautions Precautions: Fall Restrictions Weight Bearing Restrictions: No       Mobility Bed Mobility               General bed mobility comments: received at EOB  Transfers Overall transfer level: Needs assistance Equipment used: Rolling walker (2 wheeled) Transfers: Sit to/from Stand Sit to Stand: Supervision         General transfer comment: cues for hand placement and safety    Balance Overall balance assessment: Needs assistance   Sitting balance-Leahy Scale: Good       Standing balance-Leahy Scale: Fair                     ADL Overall ADL's : Needs assistance/impaired     Grooming: Wash/dry hands;Standing;Supervision/safety           Upper Body Dressing : Set up;Sitting   Lower Body Dressing: Supervision/safety;Sit to/from stand Lower Body Dressing Details (indicate cue type and reason): independent with socks Toilet Transfer: Midwife Details (indicate cue type and reason): recommended use of 3 in 1 over toilet at home [place] and Hygiene: Minimal assistance;Sit to/from stand Toileting - Clothing Manipulation Details (indicate cue type and reason): to keep gown out of toilet, pt performed pericare seated on toilet without assist   Tub/Shower Transfer Details (indicate cue type and reason): instructed in use of 3 in 1 as shower seat and pt must have supervision for transfers Functional mobility during ADLs: Supervision/safety;Rolling walker;Cueing for safety General ADL Comments: educated daughter in home safety and fall prevention and use of bag for transporting items and keeping phone nearby.      Vision                     Perception     Praxis      Cognition   Behavior During Therapy: [facility] for tasks assessed/performed Overall Cognitive Status: Impaired/Different from baseline Area of Impairment: Safety/judgement          Safety/Judgement: Decreased awareness of deficits;Decreased awareness of safety          Extremity/Trunk Assessment               Exercises     Shoulder Instructions       General Comments      Pertinent Vitals/ Pain       Pain Assessment: Faces Faces Pain Scale: No hurt  Home Living  Prior Functioning/Environment              Frequency  Min 3X/week        Progress Toward Goals  OT Goals(current goals can now be found in the care plan section)  Progress towards OT goals: Progressing toward goals  Acute Rehab OT Goals Time For Goal Achievement: 12/05/16 Potential to Achieve Goals: Good  Plan Discharge plan needs to be updated    Co-evaluation                 End of Session Equipment Utilized During Treatment: Gait belt;Rolling walker   Activity Tolerance Patient tolerated treatment well   Patient Left in bed;with call bell/phone within reach;with family/visitor present   Nurse Communication          Time: 4665-9935 OT Time Calculation (min): 22  min  Charges: OT General Charges $OT Visit: 1 Procedure OT Treatments $Self Care/Home Management : 8-22 mins  Malka So 11/25/2016, 1:57 PM  [phone]

## 2016-11-25 NOTE — Discharge Summary (Signed)
Physician Discharge Summary  Patricia Wagner UYQ:[id number] DOB: Nov 24, 1951 DOA: 11/20/2016  PCP: Patricia Cower, MD  Admit date: 11/20/2016 Discharge date: 11/25/2016  Admitted From: home Disposition:  Home, patient refusing CIR  Recommendations for Outpatient Follow-up:  1. Follow up with PCP in 1-2 weeks 2. Follow up with Cardiology as well as in INR clinic as scheduled 3. Lovenox / Coumadin bridge on discharge  Home Health: PT, RN Equipment/Devices: walker  Discharge Condition: stable CODE STATUS: Full Diet recommendation: heart healthy  HPI: per Dr. Marily Wagner, Patricia Wagner is a 66 y.o. female with medical history significant of CAD, systolic CHF, diverticulosis, HLD, morbid obesity. Patient reports awakening at approximately 4:00 in the morning with left eye pain and left-sided headache. Associated with dizziness. Single episode of nausea and vomiting. Dizziness is constant. Denies any blurred vision or vision loss. Overall symptoms are slowly improving. Patient reports doing very dry mouth and having difficulty ambulating to the bathroom. After arriving in the emergency room patient complaints of some intermittent shortness of breath but denies any coughing, wheezing, fevers. She denies any recent palpitations since last cardioversion. Patient endorses compliance with home medications. Patient unsure of what all of her medicines are formed but states that she takes everything she's been prescribed. Patient also denies any recent dysuria, frequency, back pain, lower extremity swelling above baseline. ED Course:  Patient found to be bradycardic. EKG showing sinus rhythm. Called for admission. MRI ordered by EDP to evaluate for dizziness and possible stroke.   Hospital Course: Discharge Diagnoses:  Active Problems:   HLD (hyperlipidemia)   Essential hypertension   CKD (chronic kidney disease), stage III   Paroxysmal atrial fibrillation (HCC)   Bradycardia   Stroke (HCC)   Chronic  systolic congestive heart failure (HCC)   Acute CVA - Noted on MRI, acute nonhemorrhagic left superior cerebellar infarct. Neurology consulted and have followed patient while hospitalized. 2-D echo with EF of 20-25%. I discussed with cardiology as well as neurology, patient had an acute stroke while being anticoagulated with Eliquis. Given the need for cardioversion and prior cardioversions, per cardiology recommendations, patient will be switched to anti-coagulation with Coumadin and is currently bridging with Lovenox. She has an appointment in INR clinic in 6 days. Paroxysmal A. Fib / symptomatic bradycardia - Her AV nodal agents were held on admission, she went back into A. fib with rates into the 130s with ambulation however controlled at rest. She was started back on low-dose metoprolol 25 twice a day with improvement in her rates and was discharged home with this modified dosing. She will see cardiology as an outpatient after at least a month of being anticoagulated with Coumadin and reevaluate for cardioversion Acute on chronic systolic CHF with acute hypoxic respiratory failure - Chest x-ray on admission showed pulmonary vascular congestion and cardiomegaly, and patient was hypoxemic requiring supplemental oxygen. She was diuresed initially with IV Lasix with improvement in her weight from 242 pounds to 232 pounds, her oxygen was able to be weaned off and she is stable on room air. She was transitioned to by mouth Lasix. She was mild creatinine bump due to diuresis, will need repeat BMP in 6 days when she will get her INR checked. CKD II - III - baseline creatinine 1-1.2, at baseline on discharge    Discharge Instructions  Allergies as of 11/25/2016      Reactions   Ace Inhibitors Palpitations      Medication List    STOP taking these medications  apixaban 5 MG Tabs tablet Commonly known as:  ELIQUIS   digoxin 0.125 MG tablet Commonly known as:  LANOXIN   Magnesium 250 MG Tabs     sotalol 120 MG tablet Commonly known as:  BETAPACE     TAKE these medications   acetaminophen 325 MG tablet Commonly known as:  TYLENOL Take 2 tablets (650 mg total) by mouth every 4 (four) hours as needed for headache or mild pain. What changed:  how much to take   aspirin 81 MG EC tablet Take 1 tablet (81 mg total) by mouth daily. Start taking on:  11/26/2016   enoxaparin 100 MG/ML injection Commonly known as:  LOVENOX Inject 1 mL (100 mg total) into the skin every 12 (twelve) hours.   furosemide 40 MG tablet Commonly known as:  LASIX Take 1.5 tablets (60 mg total) by mouth daily.   losartan 25 MG tablet Commonly known as:  COZAAR Take 1 tablet (25 mg total) by mouth daily.   magnesium oxide 400 MG tablet Commonly known as:  MAG-OX Take 400 mg by mouth daily.   metoprolol tartrate 25 MG tablet Commonly known as:  LOPRESSOR Take 1 tablet (25 mg total) by mouth 2 (two) times daily. What changed:  medication strength  how much to take   potassium chloride SA 20 MEQ tablet Commonly known as:  K-DUR,KLOR-CON Take 1 tablet (20 mEq total) by mouth daily.   rosuvastatin 10 MG tablet Commonly known as:  CRESTOR Take 1 tablet (10 mg total) by mouth daily.   warfarin 7.5 MG tablet Commonly known as:  COUMADIN Take 1 tablet (7.5 mg total) by mouth daily at 6 PM.            Durable Medical Equipment        Start     Ordered   11/25/16 1216  For home use only DME 3 n 1  Once     11/25/16 1216   11/25/16 1216  For home use only DME Walker rolling  Once    Question:  Patient needs a walker to treat with the following condition  Answer:  Stroke ([place])   11/25/16 1216     Follow-up Information    [facility] Follow up on 12/01/2016.   Specialty:  Cardiology Why:  at 11:30AM  Contact information: [address] [phone] [place], [place] Follow up on 12/15/2016.    Specialty:  Cardiology Why:  at [facility] information: [address] [place] [address] [phone]          Allergies  Allergen Reactions  . Ace Inhibitors Palpitations   Consultations:  Cardiology   Neurology   Procedures/Studies:  2D echo  Ct Angio Head W/cm &/or Wo Cm  Result Date: 11/20/2016 CLINICAL DATA:  Headache at the level the left eye beginning today. Dizziness. Stroke. Left superior cerebellar infarct. EXAM: CT ANGIOGRAPHY HEAD AND NECK TECHNIQUE: Multidetector CT imaging of the head and neck was performed using the standard protocol during bolus administration of intravenous contrast. Multiplanar CT image reconstructions and MIPs were obtained to evaluate the vascular anatomy. Carotid stenosis measurements (when applicable) are obtained utilizing NASCET criteria, using the distal internal carotid diameter as the denominator. CONTRAST:  50 mL Isovue 370 COMPARISON:  MRI of the brain from the same day. FINDINGS: CTA NECK FINDINGS Aortic arch: Study is somewhat degraded by patient motion. Atherosclerotic calcifications are  present at the aortic arch without significant stenosis. Right carotid system: The right common carotid artery demonstrates mild tortuosity without significant stenosis. The bifurcation is unremarkable. There is mild tortuosity within the cervical right ICA is well. No focal stenosis present. Left carotid system: The left common carotid artery is within normal limits. Calcifications are present at the left carotid bifurcation without significant stenosis. The cervical left ICA is unremarkable. Vertebral arteries: The right vertebral artery originates from the subclavian artery. The origin is obscured by artifact. The left vertebral artery is not visualized proximally is likely occluded. It is reconstituted at the C2 level via musculoskeletal branches. There is no significant focal stenosis of the right vertebral artery in the neck.  Skeleton: Multilevel endplate changes are present throughout cervical spine. Uncovertebral spurring contributes to osseous foraminal narrowing most evident at C4-5 and C5-6, left greater than right. Other neck: The soft tissues the neck are unremarkable. No significant adenopathy is present. Upper chest: The lung apices demonstrate mild dependent atelectasis. No focal nodule or mass lesion is present. The superior mediastinum is otherwise unremarkable. The thoracic inlet is within normal limits. Review of the MIP images confirms the above findings CTA HEAD FINDINGS Anterior circulation: Atherosclerotic calcifications are present within the cavernous internal carotid arteries bilaterally without a significant stenosis through the ICA termini bilaterally. The A1 and M1 segments are normal. The MCA bifurcations are intact bilaterally. The anterior communicating artery is patent. Mild distal small vessel disease is present without a significant proximal stenosis or occlusion. Posterior circulation: The right vertebral artery is dominant to left. Calcifications are present at dural margin of the right vertebral artery without a significant stenosis. The vertebrobasilar junction is intact. The PICA origins are visualized and normal. The left superior cerebellar artery is occluded, consistent with the area of infarction. The left PICA is engorged. This likely represents luxury perfusion. Both posterior cerebral arteries originate from the basilar tip. Both posterior cerebral arteries originate from the basilar tip. The PCA branch vessels are within normal limits bilaterally. Venous sinuses: The dural sinuses are patent. The right transverse sinus is dominant. The straight sinus and deep cerebral veins are intact. Cortical veins are unremarkable. Anatomic variants: None Delayed phase: The postcontrast images demonstrate no pathologic enhancement. The left superior cerebellar infarct is again noted. Review of the MIP images  confirms the above findings IMPRESSION: 1. Left superior cerebellar nonhemorrhagic infarct. 2. Occlusion of the left superior cerebellar artery. 3. Mild engorgement of the left posterior inferior cerebellar artery likely representing luxury perfusion. 4. Atherosclerotic calcifications at the dural margin of the right vertebral artery in both cavernous internal carotid arteries without significant stenosis. 5. Mild atherosclerotic changes of the left carotid bifurcation and at the aortic arch without significant stenosis. 6. Multilevel spondylosis of the cervical spine is most evident at C4-5 and C5-6. Electronically Signed   By: San Morelle M.D.   On: 11/20/2016 12:13   Ct Head Wo Contrast  Result Date: 11/20/2016 CLINICAL DATA:  Headache behind left eye.  Dizziness. EXAM: CT HEAD WITHOUT CONTRAST TECHNIQUE: Contiguous axial images were obtained from the base of the skull through the vertex without intravenous contrast. COMPARISON:  None. FINDINGS: Brain: No mass lesion, intraparenchymal hemorrhage or extra-axial collection. No evidence of acute cortical infarct. Brain parenchyma and CSF-containing spaces are normal for age. Vascular: Atherosclerotic calcification of the vertebral and internal carotid arteries at the skullbase. Skull: Normal visualized skull base, calvarium and extracranial soft tissues. Sinuses/Orbits: No sinus fluid levels or advanced mucosal thickening.  No mastoid effusion. Normal orbits. IMPRESSION: No acute intracranial abnormality. Electronically Signed   By: Ulyses Jarred M.D.   On: 11/20/2016 06:21   Ct Angio Neck W/cm &/or Wo/cm  Result Date: 11/20/2016 CLINICAL DATA:  Headache at the level the left eye beginning today. Dizziness. Stroke. Left superior cerebellar infarct. EXAM: CT ANGIOGRAPHY HEAD AND NECK TECHNIQUE: Multidetector CT imaging of the head and neck was performed using the standard protocol during bolus administration of intravenous contrast. Multiplanar CT  image reconstructions and MIPs were obtained to evaluate the vascular anatomy. Carotid stenosis measurements (when applicable) are obtained utilizing NASCET criteria, using the distal internal carotid diameter as the denominator. CONTRAST:  50 mL Isovue 370 COMPARISON:  MRI of the brain from the same day. FINDINGS: CTA NECK FINDINGS Aortic arch: Study is somewhat degraded by patient motion. Atherosclerotic calcifications are present at the aortic arch without significant stenosis. Right carotid system: The right common carotid artery demonstrates mild tortuosity without significant stenosis. The bifurcation is unremarkable. There is mild tortuosity within the cervical right ICA is well. No focal stenosis present. Left carotid system: The left common carotid artery is within normal limits. Calcifications are present at the left carotid bifurcation without significant stenosis. The cervical left ICA is unremarkable. Vertebral arteries: The right vertebral artery originates from the subclavian artery. The origin is obscured by artifact. The left vertebral artery is not visualized proximally is likely occluded. It is reconstituted at the C2 level via musculoskeletal branches. There is no significant focal stenosis of the right vertebral artery in the neck. Skeleton: Multilevel endplate changes are present throughout cervical spine. Uncovertebral spurring contributes to osseous foraminal narrowing most evident at C4-5 and C5-6, left greater than right. Other neck: The soft tissues the neck are unremarkable. No significant adenopathy is present. Upper chest: The lung apices demonstrate mild dependent atelectasis. No focal nodule or mass lesion is present. The superior mediastinum is otherwise unremarkable. The thoracic inlet is within normal limits. Review of the MIP images confirms the above findings CTA HEAD FINDINGS Anterior circulation: Atherosclerotic calcifications are present within the cavernous internal carotid  arteries bilaterally without a significant stenosis through the ICA termini bilaterally. The A1 and M1 segments are normal. The MCA bifurcations are intact bilaterally. The anterior communicating artery is patent. Mild distal small vessel disease is present without a significant proximal stenosis or occlusion. Posterior circulation: The right vertebral artery is dominant to left. Calcifications are present at dural margin of the right vertebral artery without a significant stenosis. The vertebrobasilar junction is intact. The PICA origins are visualized and normal. The left superior cerebellar artery is occluded, consistent with the area of infarction. The left PICA is engorged. This likely represents luxury perfusion. Both posterior cerebral arteries originate from the basilar tip. Both posterior cerebral arteries originate from the basilar tip. The PCA branch vessels are within normal limits bilaterally. Venous sinuses: The dural sinuses are patent. The right transverse sinus is dominant. The straight sinus and deep cerebral veins are intact. Cortical veins are unremarkable. Anatomic variants: None Delayed phase: The postcontrast images demonstrate no pathologic enhancement. The left superior cerebellar infarct is again noted. Review of the MIP images confirms the above findings IMPRESSION: 1. Left superior cerebellar nonhemorrhagic infarct. 2. Occlusion of the left superior cerebellar artery. 3. Mild engorgement of the left posterior inferior cerebellar artery likely representing luxury perfusion. 4. Atherosclerotic calcifications at the dural margin of the right vertebral artery in both cavernous internal carotid arteries without significant stenosis. 5. Mild  atherosclerotic changes of the left carotid bifurcation and at the aortic arch without significant stenosis. 6. Multilevel spondylosis of the cervical spine is most evident at C4-5 and C5-6. Electronically Signed   By: San Morelle M.D.   On:  11/20/2016 12:13   Mr Brain Wo Contrast  Result Date: 11/20/2016 CLINICAL DATA:  New onset lightheadedness and dizziness. Nausea, vomiting, and diarrhea. EXAM: MRI HEAD WITHOUT CONTRAST TECHNIQUE: Multiplanar, multiecho pulse sequences of the brain and surrounding structures were obtained without intravenous contrast. COMPARISON:  CT head without contrast 11/20/2016 FINDINGS: Brain: An acute/subacute nonhemorrhagic infarct is evident within the left superior cerebellum. No acute supratentorial infarct is present. Subtle T2 changes are associated with the infarct suggesting a more acute timeframe. A remote cortical infarct is present in the right occipital lobe medially. No other focal infarct is present. Mild periventricular T2 changes are within normal limits for age. The ventricles are normal size. No significant extra-axial fluid collection is present. Vascular: Flow is present in the major intracranial arteries. 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 The skullbase is otherwise within normal limits. The sella turcica is enlarged. No mass lesion is present. Midline sagittal structures are otherwise unremarkable. Degenerative changes are noted in the upper cervical spine with moderate cervical stenosis suggested at C4-5. Sinuses/Orbits: The paranasal sinuses and the mastoid air cells are clear. The globes and orbits are within normal limits. IMPRESSION: 1. Acute nonhemorrhagic left superior cerebellar infarct. 2. Normal flow is present within the major arteries of the circle of Willis. 3. Remote right occipital lobe infarct. Electronically Signed   By: San Morelle M.D.   On: 11/20/2016 08:41   Dg Chest Port 1 View  Result Date: 11/21/2016 CLINICAL DATA:  Patient with shortness of breath. EXAM: PORTABLE CHEST 1 VIEW COMPARISON:  Chest radiograph 11/20/2016. FINDINGS: Monitoring leads overlie the patient. Stable cardiomegaly. Pulmonary vascular  redistribution. Consolidation within the retrocardiac location. Possible small left pleural effusion. IMPRESSION: Cardiomegaly. Consolidation within the retrocardiac location favored to be secondary to atelectasis. Possible small left effusion. Electronically Signed   By: Lovey Newcomer M.D.   On: 11/21/2016 15:40   Dg Chest Port 1 View  Result Date: 11/20/2016 CLINICAL DATA:  Shortness of breath. EXAM: PORTABLE CHEST 1 VIEW COMPARISON:  10/20/2016 and 05/04/2008 FINDINGS: There is cardiomegaly and aortic atherosclerosis. There is new pulmonary vascular congestion. No discrete effusions. IMPRESSION: Findings consistent with congestive heart failure with pulmonary vascular congestion and cardiomegaly. Aortic atherosclerosis. Electronically Signed   By: Lorriane Shire M.D.   On: 11/20/2016 11:34    Subjective: - no chest pain, shortness of breath, no abdominal pain, nausea or vomiting. Upset with the hospital system. Refusing CIR  Discharge Exam: Vitals:   11/25/16 0511 11/25/16 1019  BP: 117/74 111/62  Pulse: 67 69  Resp: 18 16  Temp: 98.9 F (37.2 C) 98.8 F (37.1 C)   Vitals:   11/25/16 0104 11/25/16 0153 11/25/16 0511 11/25/16 1019  BP: (!) 119/54  117/74 111/62  Pulse: 86  67 69  Resp: 18  18 16   Temp: 98.7 F (37.1 C)  98.9 F (37.2 C) 98.8 F (37.1 C)  TempSrc: Oral  Oral Oral  SpO2: 91%  94% 94%  Weight:  105.6 kg (232 lb 11.2 oz)    Height:        General: Pt is alert, awake, not in acute distress Cardiovascular: irregular Respiratory: CTA bilaterally, no wheezing, no rhonchi Abdominal: Soft, NT, ND, bowel  sounds +   The results of significant diagnostics from this hospitalization (including imaging, microbiology, ancillary and laboratory) are listed below for reference.     Microbiology: No results found for this or any previous visit (from the past 240 hour(s)).   Labs: BNP (last 3 results)  Recent Labs  10/20/16 1731 10/31/16 1246 11/20/16 1121  BNP  998.6* 1,218.8* 0,459.9*   Basic Metabolic Panel:  Recent Labs Lab 11/21/16 0541 11/22/16 0326 11/23/16 0448 11/24/16 0519 11/25/16 1122  NA 142 141 139 141 139  K 3.9 3.5 2.8* 3.4* 3.3*  CL 100* 94* 90* 93* 90*  CO2 35* 37* 39* 40* 35*  GLUCOSE 98 92 86 99 122*  BUN 14 15 15 15 17   CREATININE 1.00 1.10* 1.01* 1.06* 1.26*  CALCIUM 8.9 8.8* 8.7* 9.1 9.3   Liver Function Tests:  Recent Labs Lab 11/20/16 0527  AST 65*  ALT 51  ALKPHOS 60  BILITOT 0.6  PROT 6.6  ALBUMIN 3.1*   No results for input(s): LIPASE, AMYLASE in the last 168 hours. No results for input(s): AMMONIA in the last 168 hours. CBC:  Recent Labs Lab 11/20/16 0527 11/21/16 0541 11/23/16 0448 11/24/16 0519  WBC 9.5 7.4 7.0 6.4  HGB 13.6 13.3 14.7 14.3  HCT 43.6 42.8 46.3* 45.3  MCV 93.2 93.7 91.5 92.1  PLT 167 160 145* 139*   Cardiac Enzymes:  Recent Labs Lab 11/20/16 0527 11/20/16 1121 11/20/16 1532 11/20/16 2222  TROPONINI <0.03 <0.03 0.03* 0.03*   BNP: Invalid input(s): POCBNP CBG:  Recent Labs Lab 11/20/16 0524  GLUCAP 141*   D-Dimer No results for input(s): DDIMER in the last 72 hours. Hgb A1c No results for input(s): HGBA1C in the last 72 hours. Lipid Profile No results for input(s): CHOL, HDL, LDLCALC, TRIG, CHOLHDL, LDLDIRECT in the last 72 hours. Thyroid function studies No results for input(s): TSH, T4TOTAL, T3FREE, THYROIDAB in the last 72 hours.  Invalid input(s): FREET3 Anemia work up No results for input(s): VITAMINB12, FOLATE, FERRITIN, TIBC, IRON, RETICCTPCT in the last 72 hours. Urinalysis    Component Value Date/Time   COLORURINE YELLOW 11/23/2016 1530   APPEARANCEUR CLOUDY (A) 11/23/2016 1530   LABSPEC 1.008 11/23/2016 1530   PHURINE 8.0 11/23/2016 1530   GLUCOSEU NEGATIVE 11/23/2016 1530   GLUCOSEU NEGATIVE 10/25/2015 1658   HGBUR SMALL (A) 11/23/2016 1530   BILIRUBINUR NEGATIVE 11/23/2016 1530   KETONESUR NEGATIVE 11/23/2016 1530   PROTEINUR  NEGATIVE 11/23/2016 1530   UROBILINOGEN 0.2 10/25/2015 1658   NITRITE NEGATIVE 11/23/2016 1530   LEUKOCYTESUR NEGATIVE 11/23/2016 1530   Sepsis Labs Invalid input(s): PROCALCITONIN,  WBC,  LACTICIDVEN Microbiology No results found for this or any previous visit (from the past 240 hour(s)).  Time coordinating discharge: Over 30 minutes  SIGNED:  Marzetta Board, MD  [facility] 11/25/2016, 2:18 PM Pager [phone]  If 7PM-7AM, please contact night-coverage [email or web address] Password [email or web address]

## 2016-11-25 NOTE — Progress Notes (Signed)
ANTICOAGULATION CONSULT NOTE - Initial Consult  Pharmacy Consult for Enoxaparin/Warfarin Indication: atrial fibrillation and stroke  Assessment: 66 yo obese female admitted 12/28 with acute CVA. Patient has h/o persistent Afib, previously anticoagulated with Eliquis (CHADS2VASC 5). Since patient developed a stroke on Eliquis with reported compliance, she was transitioned to warfarin in preparation for another cardioversion. Pharmacy was consulted to dose enoxaparin and warfarin during this transition. Last dose of Eliquis was 0921 on 12/30, baseline INR 1.17.  AM labs today not drawn until ~11AM today.  Today, INR 1.52 increased from 1.24, though remains subtherapeutic. Has only received 3 days of coumadin since initiated on 11/22/16.  Hgb wnl, platelets low at 139 as of yesterday 11/24/16. No bleeding noted. Enoxaparin  dose decreased on 11/24/16 from 110mg  to 100mg  for weight loss. Enoxaparin 1mg /kg SQ q12h   Dr. Radford Pax, cardiologist notes plan for close outpatient follow up in the AF clinic.   Goal of Therapy:  INR 2-3 Monitor platelets by anticoagulation protocol: Yes   Plan:  Continue Enoxaparin 100 mg (~1 mg/kg) SQ every 12 hours Warfarin 10 mg x 1 dose tonight Daily INR, CBC as needed, s/sx's bleeding Discontinue enoxaparin when INR > 2 + at least 5 days of overlap Coumadin educational book/video ordered.    Allergies  Allergen Reactions  . Ace Inhibitors Palpitations    Patient Measurements: Height: 5\' 5"  (165.1 cm) Weight: 232 lb 11.2 oz (105.6 kg) IBW/kg (Calculated) : 57 Heparin Dosing Weight: 109.8 kg  Vital Signs: Temp: 98.8 F (37.1 C) (01/02 1019) Temp Source: Oral (01/02 1019) BP: 111/62 (01/02 1019) Pulse Rate: 69 (01/02 1019)  Labs:  Recent Labs  11/23/16 0448 11/24/16 0519 11/25/16 1122  HGB 14.7 14.3  --   HCT 46.3* 45.3  --   PLT 145* 139*  --   LABPROT 16.3* 15.7* 18.4*  INR 1.31 1.24 1.52  CREATININE 1.01* 1.06* 1.26*    Estimated  Creatinine Clearance: 55.1 mL/min (by C-G formula based on SCr of 1.26 mg/dL (H)).   Medical History: Past Medical History:  Diagnosis Date  . Arthritis   . CAD in native artery, s/p cardiac cath with non obstructive CAD 10/24/2016  . CHF (congestive heart failure) ([place])   . DIVERTICULITIS, HX OF 07/25/2007  . DIVERTICULOSIS, COLON 07/22/2007  . Dyspnea   . Edema, peripheral    a. chronic BLE edema, R>L. Prior trauma from dog attack and accident.  Marland Kitchen HYPERLIPIDEMIA 02/03/2008  . Hypersomnia    declines w/u  . Hypertension   . MENOPAUSAL DISORDER 01/09/2011  . Morbid obesity ([place]) 07/22/2007  . NICM (nonischemic cardiomyopathy) ([place]) 10/24/2016  . Raynaud's syndrome 07/22/2007  . THYROID NODULE, RIGHT 01/04/2010  . VITAMIN D DEFICIENCY 01/09/2011   Qualifier: Diagnosis of  By: [name], [name]     Medications:  Scheduled:  .  stroke: mapping our early stages of recovery book   Does not apply Once  . aspirin EC  81 mg Oral Daily  . enoxaparin (LOVENOX) injection  100 mg Subcutaneous Q12H  . furosemide  60 mg Oral Daily  . magnesium oxide  400 mg Oral Daily  . mouth rinse  15 mL Mouth Rinse BID  . metoprolol tartrate  25 mg Oral BID  . rosuvastatin  10 mg Oral QHS  . sodium chloride flush  3 mL Intravenous Q12H  . Warfarin - Pharmacist Dosing Inpatient   Does not apply [id number]    Belia Heman, PharmD PGY1 Pharmacy Resident [phone] (Pager) 11/25/2016 1:37  PM

## 2016-11-25 NOTE — Progress Notes (Signed)
Patient Name: Patricia Wagner Date of Encounter: 11/25/2016  Primary Cardiologist: Dr. [facility] Problem List     Active Problems:   HLD (hyperlipidemia)   Essential hypertension   CKD (chronic kidney disease), stage III   Paroxysmal atrial fibrillation (HCC)   Bradycardia   Stroke ([place])   Chronic systolic congestive heart failure (HCC)     Subjective   No complaints today.  Remains in afib with controlled HR.  Inpatient Medications    Scheduled Meds: .  stroke: mapping our early stages of recovery book   Does not apply Once  . aspirin EC  81 mg Oral Daily  . enoxaparin (LOVENOX) injection  100 mg Subcutaneous Q12H  . furosemide  60 mg Oral Daily  . magnesium oxide  400 mg Oral Daily  . mouth rinse  15 mL Mouth Rinse BID  . metoprolol tartrate  25 mg Oral BID  . rosuvastatin  10 mg Oral QHS  . sodium chloride flush  3 mL Intravenous Q12H  . Warfarin - Pharmacist Dosing Inpatient   Does not apply q1800   Continuous Infusions:  PRN Meds: acetaminophen **OR** acetaminophen, hydrALAZINE, ondansetron **OR** ondansetron (ZOFRAN) IV, traMADol   Vital Signs    Vitals:   11/25/16 0104 11/25/16 0153 11/25/16 0511 11/25/16 1019  BP: (!) 119/54  117/74 111/62  Pulse: 86  67 69  Resp: 18  18 16   Temp: 98.7 F (37.1 C)  98.9 F (37.2 C) 98.8 F (37.1 C)  TempSrc: Oral  Oral Oral  SpO2: 91%  94% 94%  Weight:  232 lb 11.2 oz (105.6 kg)    Height:        Intake/Output Summary (Last 24 hours) at 11/25/16 1106 Last data filed at 11/25/16 1019  Gross per 24 hour  Intake              240 ml  Output                0 ml  Net              240 ml   Filed Weights   11/23/16 1046 11/24/16 0500 11/25/16 0153  Weight: 227 lb 8 oz (103.2 kg) 226 lb 1.6 oz (102.6 kg) 232 lb 11.2 oz (105.6 kg)    Physical Exam    GEN: Well nourished, well developed, in no acute distress.  HEENT: Grossly normal.  Neck: Supple, no JVD, carotid bruits, or masses. Cardiac:  Irregularly irregular no murmurs, rubs, or gallops. No clubbing, cyanosis, edema.  Radials/DP/PT 2+ and equal bilaterally.  Respiratory:  Respirations regular and unlabored, clear to auscultation bilaterally. GI: Soft, nontender, nondistended, BS + x 4. MS: no deformity or atrophy. Skin: warm and dry, no rash. Neuro:  Strength and sensation are intact. Psych: AAOx3.  Normal affect.  Labs    CBC  Recent Labs  11/23/16 0448 11/24/16 0519  WBC 7.0 6.4  HGB 14.7 14.3  HCT 46.3* 45.3  MCV 91.5 92.1  PLT 145* 027*   Basic Metabolic Panel  Recent Labs  11/23/16 0448 11/24/16 0519  NA 139 141  K 2.8* 3.4*  CL 90* 93*  CO2 39* 40*  GLUCOSE 86 99  BUN 15 15  CREATININE 1.01* 1.06*  CALCIUM 8.7* 9.1   Liver Function Tests No results for input(s): AST, ALT, ALKPHOS, BILITOT, PROT, ALBUMIN in the last 72 hours. No results for input(s): LIPASE, AMYLASE in the last 72 hours. Cardiac Enzymes No results for  input(s): CKTOTAL, CKMB, CKMBINDEX, TROPONINI in the last 72 hours. BNP Invalid input(s): POCBNP D-Dimer No results for input(s): DDIMER in the last 72 hours. Hemoglobin A1C No results for input(s): HGBA1C in the last 72 hours. Fasting Lipid Panel No results for input(s): CHOL, HDL, LDLCALC, TRIG, CHOLHDL, LDLDIRECT in the last 72 hours. Thyroid Function Tests No results for input(s): TSH, T4TOTAL, T3FREE, THYROIDAB in the last 72 hours.  Invalid input(s): FREET3  Telemetry    Atrial fibrillation - Personally Reviewed  ECG    Atrial fibrillation - Personally Reviewed  Radiology    No results found.  Cardiac Studies   2D echo Study Conclusions  - Left ventricle: The cavity size was mildly to moderately dilated.   Wall thickness was increased in a pattern of mild LVH. Systolic   function was severely reduced. The estimated ejection fraction   was in the range of 20% to 25%. Diffuse hypokinesis.   Indeterminant diastolic function (atrial fibrillation). -  Aortic valve: There was no stenosis. - Mitral valve: Mildly calcified annulus. There was trivial   regurgitation. - Left atrium: The atrium was severely dilated. - Right ventricle: The cavity size was normal. Systolic function   was moderately reduced. - Right atrium: The atrium was moderately dilated. - Pulmonary arteries: No complete TR doppler jet so unable to   estimate PA systolic pressure. - Systemic veins: IVC measured 1.9 cm with < 50% respirophasic   variation, suggesting RA pressure 8 mmHg. - Pericardium, extracardiac: A trivial pericardial effusion was   identified posterior to the heart.  Impressions:  - The patient was in atrial fibrillation. Mild to moderate LV   dilation with mild LV hypertrophy. EF 20-25%, diffuse   hypokinesis. Normal RV size with moderately decreased systolic   function. Biatrial enlargement.  Patient Profile     66 y.o.femalewith medical history significant of CAD, systolic CHF, diverticulosis, HLD, morbid obesity atrial fibrillation,  who was admitted to the hospital on 12/28 with left-sided headache, dizziness, episode of nausea and vomiting. MRI on admission showed acute CVA, and neurology   Assessment & Plan   1.  Persistent atrial fibrillation The patient has recurrent persistent atrial fibrillation after presenting with acute stroke and bradycardia.  Home medications included Sotalol 120mg  twice daily, Metropolol 50mg  twice daily, Digoxin, and Eliquis.  Sotalol, metoprolol and digoxin stopped with return to rate controlled atrial fibrillation. She is currently asymptomatic, but she developed worsening heart failure symptoms in the past with prolonged atrial fibrillation With acute stroke, would leave in rate controlled atrial fibrillation for now She reported compliance with Eliquis so considered a failure and now loading with coumadin with Lovenox bridge.  Will let neurology help guide transition Will arrange close outpatient follow up in  the AF clinic [place] is 5  2.  Acute on chronic systolic heart failure Continue medical therapy .  She is currently net postive with I&O's but clinically appears euvolemic on PO diuretics.  Resume ACE-I when able per neurology.  Continue BB. Would optimize medical therapy and consider Amio in the future to restore SR again with repeat EF after 3 months in SR prior to deciding about need for ICD for primary prevention  3.  Obesity Body mass index is 40.27 kg/m. Weight loss encouraged  4.  HTN Stable No change required today  5.  Hypokalemia - BMET pending this am  No new recs at this time.  Will sign off.  I will arrange followup in afib clinic and with Dr.  [name].  Signed, Fransico Him, MD  11/25/2016, 11:06 AM

## 2016-11-26 ENCOUNTER — Telehealth: Payer: Self-pay | Admitting: *Deleted

## 2016-11-26 NOTE — Telephone Encounter (Signed)
Transition Care Management Follow-up Telephone Call. Pt was on the patient ping list   Date discharged? 11/25/16   How have you been since you were released from the hospital? Pt states she is ok   Do you understand why you were in the hospital? YES   Do you understand the discharge instructions? YES   Where were you discharged to? Home   Items Reviewed:  Medications reviewed: YES  Allergies reviewed: YES  Dietary changes reviewed: NO  Referrals reviewed: No referral   Functional Questionnaire:   Activities of Daily Living (ADLs):   She states she are independent in the following: ambulation, bathing and hygiene, feeding, continence, grooming, toileting and dressing States she doesn't need assistance with the following:    Any transportation issues/concerns?: NO   Any patient concerns? NO   Confirmed importance and date/time of follow-up visits scheduled YES. Made appt for 12/04/16  Provider Appointment booked with Dr. [name]   Confirmed with patient if condition begins to worsen call PCP or go to the ER.  Patient was given the office number and encouraged to call back with question or concerns.  : YES

## 2016-12-01 ENCOUNTER — Telehealth: Payer: Self-pay | Admitting: Internal Medicine

## 2016-12-01 ENCOUNTER — Ambulatory Visit (HOSPITAL_COMMUNITY)
Admit: 2016-12-01 | Discharge: 2016-12-01 | Disposition: A | Discharge status: Home / Self Care | Payer: Managed Care, Other (non HMO) | Attending: Nurse Practitioner | Admitting: Nurse Practitioner

## 2016-12-01 ENCOUNTER — Other Ambulatory Visit: Payer: Self-pay

## 2016-12-01 ENCOUNTER — Other Ambulatory Visit (HOSPITAL_COMMUNITY): Payer: Self-pay | Admitting: *Deleted

## 2016-12-01 ENCOUNTER — Encounter (HOSPITAL_COMMUNITY): Payer: Self-pay | Admitting: Nurse Practitioner

## 2016-12-01 VITALS — BP 122/84 | HR 102 | Ht 65.0 in | Wt 230.2 lb

## 2016-12-01 DIAGNOSIS — I2510 Atherosclerotic heart disease of native coronary artery without angina pectoris: Secondary | ICD-10-CM | POA: Insufficient documentation

## 2016-12-01 DIAGNOSIS — I4891 Unspecified atrial fibrillation: Secondary | ICD-10-CM | POA: Diagnosis not present

## 2016-12-01 DIAGNOSIS — I509 Heart failure, unspecified: Secondary | ICD-10-CM | POA: Diagnosis not present

## 2016-12-01 DIAGNOSIS — Z7982 Long term (current) use of aspirin: Secondary | ICD-10-CM | POA: Diagnosis not present

## 2016-12-01 DIAGNOSIS — Z87891 Personal history of nicotine dependence: Secondary | ICD-10-CM | POA: Diagnosis not present

## 2016-12-01 DIAGNOSIS — I481 Persistent atrial fibrillation: Secondary | ICD-10-CM | POA: Diagnosis not present

## 2016-12-01 DIAGNOSIS — I429 Cardiomyopathy, unspecified: Secondary | ICD-10-CM | POA: Diagnosis not present

## 2016-12-01 DIAGNOSIS — Z7901 Long term (current) use of anticoagulants: Secondary | ICD-10-CM | POA: Diagnosis not present

## 2016-12-01 DIAGNOSIS — M4802 Spinal stenosis, cervical region: Secondary | ICD-10-CM | POA: Insufficient documentation

## 2016-12-01 DIAGNOSIS — Z8673 Personal history of transient ischemic attack (TIA), and cerebral infarction without residual deficits: Secondary | ICD-10-CM | POA: Diagnosis not present

## 2016-12-01 DIAGNOSIS — I4892 Unspecified atrial flutter: Secondary | ICD-10-CM | POA: Diagnosis not present

## 2016-12-01 DIAGNOSIS — I4819 Other persistent atrial fibrillation: Secondary | ICD-10-CM

## 2016-12-01 DIAGNOSIS — E785 Hyperlipidemia, unspecified: Secondary | ICD-10-CM | POA: Insufficient documentation

## 2016-12-01 DIAGNOSIS — I110 Hypertensive heart disease with heart failure: Secondary | ICD-10-CM | POA: Insufficient documentation

## 2016-12-01 LAB — BASIC METABOLIC PANEL
Anion gap: 6 (ref 5–15)
BUN: 15 mg/dL (ref 6–20)
CALCIUM: 9.4 mg/dL (ref 8.9–10.3)
CHLORIDE: 100 mmol/L — AB (ref 101–111)
CO2: 33 mmol/L — AB (ref 22–32)
CREATININE: 1.13 mg/dL — AB (ref 0.44–1.00)
GFR calc non Af Amer: 51 mL/min — ABNORMAL LOW (ref >60)
GFR, EST AFRICAN AMERICAN: 59 mL/min — AB (ref >60)
Glucose, Bld: 105 mg/dL — ABNORMAL HIGH (ref 65–99)
Potassium: 3.5 mmol/L (ref 3.5–5.1)
SODIUM: 139 mmol/L (ref 135–145)

## 2016-12-01 LAB — PROTIME-INR
INR: 3.49
Prothrombin Time: 35.9 seconds — ABNORMAL HIGH (ref 11.4–15.2)

## 2016-12-01 MED ORDER — WARFARIN SODIUM 7.5 MG PO TABS: ORAL_TABLET | ORAL | 0 refills | Status: DC

## 2016-12-01 MED ORDER — FUROSEMIDE 40 MG PO TABS: 40.0000 mg | ORAL_TABLET | Freq: Every day | ORAL | 1 refills | Status: DC

## 2016-12-01 NOTE — Patient Outreach (Signed)
[facility] [place]) Care Management  12/01/2016  MERYL HUBERS Mar 25, 1951 [id number]     EMMI- Stroke RED ON EMMI ALERT Day # 1 Date: 11/28/16 Red Alert Reason: " Questions/problems with meds? Yes"   Outreach attempt #1 to patient. Spoke with patient. Reviewed and addressed red alert. She reports that her discharge paperwork states that she should be taking 1.5 tab (60 mg) of Lasix. However, she reports that instructions on bottle states to take only one tablet. Patient reports she was taking Lasix prior to hospitalization but is unsure of what her dosage was. She reports that this is a new bottle of med. She voices that she is headed to MD appt. RN CM instructed patient to clarify with MD office regarding Lasix dosage instructions. Patient has appt at A-Fib clinic today and goes to see PCP on 12/04/16. Denies any issues with transportation. She was set up with [facility] services. No further RN CM need or concerns. Patient advised that she would continue to receive automated EMMI stroke f/u calls over the course of the next two weeks and will receive a call from a [facility] RN if any of her responses are abnormal RN CM contacted patient's PCP office(Dr. [name]). Spoke with office staff and advised of the above regarding patient's Lasix and requested office f/u with patient.    Plan: RN CM will notify [facility] administrative assistant of case status.  Enzo Montgomery, RN,BSN,CCM [place] Management Telephonic Care Management Coordinator Direct Phone: [phone] Toll Free: [phone] Fax: [phone]

## 2016-12-01 NOTE — Progress Notes (Signed)
Primary Care Physician: Cathlean Cower, MD Referring Physician: [facility] f/u Cardiologist: Dr. Pennie Banter EP: Dr. [name] is a 66 y.o. female with a h/o  past medical history significant for persistent atrial fibrillation, non ischemic cardiomyopathy, hyperlipidemia, and hypertension.  Because of cardiomyopathy, it was felt that she would require AAD therapy to maintain SR.  She was concerned about cost for Tikosyn and so Sotalol was initiated. She underwent cardioversion on 11/03/16 with restoration of sinus rhythm.  Her fluid status was improved on discharge at 239 lbs.  F/u in afib clinic 12/14 and is found to be in atrial flutter with RVR at 112 bpm. Pt is unaware of heart beat. Fluid status is stable per pt and is no longer having LLE, PND/orthopnea.  F/u afib clinic 12/20. She is still in afib but rate is controlled. BB was increased on last visit. She does not want another cardioversion at this time. She feels well and would like to return to work. She is having diarrhea which is probably the effects of the mag supplement.   F/u 12/21, she asked to be seen today for feeling poorly, more short of breath, still having diarrhea, she thinks increase of metoprolol caused loose stools and stopping magnesium for several days did not help. She continues in rate controlled afib but her weight is up 2 lbs. She states that she is being complaint with all meds and has not missed any sotalol or eliquis. She asked to return to work on last visit but is having difficulty carrying out her activities due to fatigue, and asked to be placed out of work until she feels better. Decision was made to cardiovert.  She returns to afib clinic 1/8. She had a successful cardioversion 12/26 but presented to the ER 12/28 with left eye pain and left sided H/A.She was also found to be bradycardic. Sotalol was stopped.Marland KitchenMRI ordered and showed acute nonhemorrhagic left superior cerebellar infarct. EF continued to show EF  of 20-25 %. Pt states that she had taken DOAC without interruption but it was decided to switch pt to coumadin and lovenox.Pt states today that she finished lovenox shots on Saturday. She has mild weakness in left hand and leg but is undergoing PT and is improving. She was suppose to be on 1 1/2 tabs of lasix  On d/c but has been taking 40 mg a day and fluid status is stable, down 10-12 lbs. It was decided to rate control her for a while. Currently she is not noticing any palpitations .  Today, she denies symptoms of   chest pain,  orthopnea, PND, lower extremity edema, dizziness, presyncope, syncope, or neurologic sequela. positive for some residual left sided weakness iof hand and leg improving with PT..The patient is tolerating medications without difficulties and is otherwise without complaint today.   Past Medical History:  Diagnosis Date  . Arthritis   . CAD in native artery, s/p cardiac cath with non obstructive CAD 10/24/2016  . CHF (congestive heart failure) ([place])   . DIVERTICULITIS, HX OF 07/25/2007  . DIVERTICULOSIS, COLON 07/22/2007  . Dyspnea   . Edema, peripheral    a. chronic BLE edema, R>L. Prior trauma from dog attack and accident.  Marland Kitchen HYPERLIPIDEMIA 02/03/2008  . Hypersomnia    declines w/u  . Hypertension   . MENOPAUSAL DISORDER 01/09/2011  . Morbid obesity ([place]) 07/22/2007  . NICM (nonischemic cardiomyopathy) ([place]) 10/24/2016  . Raynaud's syndrome 07/22/2007  . THYROID NODULE, RIGHT 01/04/2010  .  VITAMIN D DEFICIENCY 01/09/2011   Qualifier: Diagnosis of  By: [name], [name]    Past Surgical History:  Procedure Laterality Date  . CARDIAC CATHETERIZATION N/A 10/23/2016   Procedure: Left Heart Cath and Coronary Angiography;  Surgeon: [name], MD;  Location: [place] CV LAB;  Service: Cardiovascular;  Laterality: N/A;  . CARDIOVERSION N/A 10/31/2016   Procedure: CARDIOVERSION;  Surgeon: [name], MD;  Location: [facility] ENDOSCOPY;  Service: Cardiovascular;  Laterality:  N/A;  . CARDIOVERSION N/A 11/03/2016   Procedure: CARDIOVERSION;  Surgeon: [name], MD;  Location: [facility] ENDOSCOPY;  Service: Cardiovascular;  Laterality: N/A;  . CARDIOVERSION N/A 11/18/2016   Procedure: CARDIOVERSION;  Surgeon: [name], MD;  Location: [facility] ENDOSCOPY;  Service: Cardiovascular;  Laterality: N/A;  . CHOLECYSTECTOMY    . COLONOSCOPY    . PARTIAL HYSTERECTOMY     1 OVARY LEFT  . POLYPECTOMY    . TEE WITHOUT CARDIOVERSION N/A 10/31/2016   Procedure: TRANSESOPHAGEAL ECHOCARDIOGRAM (TEE);  Surgeon: [name], MD;  Location: [facility] ENDOSCOPY;  Service: Cardiovascular;  Laterality: N/A;    Current Outpatient Prescriptions  Medication Sig Dispense Refill  . acetaminophen (TYLENOL) 325 MG tablet Take 2 tablets (650 mg total) by mouth every 4 (four) hours as needed for headache or mild pain. (Patient taking differently: Take 325 mg by mouth every 4 (four) hours as needed for headache or mild pain. )    . aspirin EC 81 MG EC tablet Take 1 tablet (81 mg total) by mouth daily. 30 tablet 1  . enoxaparin (LOVENOX) 100 MG/ML injection Inject 1 mL (100 mg total) into the skin every 12 (twelve) hours. 10 Syringe 0  . losartan (COZAAR) 25 MG tablet Take 1 tablet (25 mg total) by mouth daily. 30 tablet 1  . magnesium oxide (MAG-OX) 400 MG tablet Take 400 mg by mouth daily.    . metoprolol tartrate (LOPRESSOR) 25 MG tablet Take 1 tablet (25 mg total) by mouth 2 (two) times daily. 60 tablet 1  . potassium chloride SA (K-DUR,KLOR-CON) 20 MEQ tablet Take 1 tablet (20 mEq total) by mouth daily. 30 tablet 1  . rosuvastatin (CRESTOR) 10 MG tablet Take 1 tablet (10 mg total) by mouth daily. 90 tablet 3  . furosemide (LASIX) 40 MG tablet Take 1 tablet (40 mg total) by mouth daily. 30 tablet 1  . warfarin (COUMADIN) 7.5 MG tablet Take 1 tablet (7.5mg ) by mouth daily except on Fridays take 1/2 tablet (3.75mg ). 30 tablet 0   No current facility-administered medications for this encounter.      Allergies  Allergen Reactions  . Ace Inhibitors Palpitations    Social History   Social History  . Marital status: Single    Spouse name: N/A  . Number of children: 1  . Years of education: N/A   Occupational History  . Printing     Social History Main Topics  . Smoking status: Former Smoker    Packs/day: 0.50    Types: Cigarettes    Quit date: 10/23/2016  . Smokeless tobacco: Never Used     Comment: Smoked for 50 years  . Alcohol use 2.4 oz/week    4 Cans of beer per week     Comment: Intermittent  . Drug use: No  . Sexual activity: Not on file   Other Topics Concern  . Not on file   Social History Narrative  . No narrative on file    Family History  Problem Relation  Age of Onset  . Asthma Mother   . Diabetes Father   . Heart disease Father     Died of presumed heart attack - 77s  . Lung disease Sister   . Heart disease Sister     Twin sister has heart issue, unclear what kind  . Thyroid disease Neg Hx     ROS- All systems are reviewed and negative except as per the HPI above  Physical Exam: Vitals:   12/01/16 1125  BP: 122/84  Pulse: (!) 102  Weight: 230 lb 3.2 oz (104.4 kg)  Height: 5\' 5"  (1.651 m)   Wt Readings from Last 3 Encounters:  12/01/16 230 lb 3.2 oz (104.4 kg)  11/25/16 232 lb 11.2 oz (105.6 kg)  11/13/16 242 lb (109.8 kg)    Labs: Lab Results  Component Value Date   NA 139 12/01/2016   K 3.5 12/01/2016   CL 100 (L) 12/01/2016   CO2 33 (H) 12/01/2016   GLUCOSE 105 (H) 12/01/2016   BUN 15 12/01/2016   CREATININE 1.13 (H) 12/01/2016   CALCIUM 9.4 12/01/2016   MG 1.9 11/13/2016   Lab Results  Component Value Date   INR 3.49 12/01/2016   Lab Results  Component Value Date   CHOL 144 11/21/2016   HDL 43 11/21/2016   LDLCALC 89 11/21/2016   TRIG 60 11/21/2016     GEN- The patient is well appearing, alert and oriented x 3 today.   Head- normocephalic, atraumatic Eyes-  Sclera clear, conjunctiva pink Ears- hearing  intact Oropharynx- clear Neck- supple, no JVP Lymph- no cervical lymphadenopathy Lungs- Clear to ausculation bilaterally, normal work of breathing Heart-  irregular rate and rhythm, no murmurs, rubs or gallops, PMI not laterally displaced GI- soft, NT, ND, + BS Extremities- no clubbing, cyanosis, or edema MS- no significant deformity or atrophy Skin- no rash or lesion Psych- euthymic mood, full affect Neuro- strength and sensation are intact  EKG-afib at 102 bpm, qrs int 104 ms, qtc 463 ms Epic records reviewed-Study Conclusions  - Left ventricle: The cavity size was mildly to moderately dilated.   Wall thickness was increased in a pattern of mild LVH. Systolic   function was severely reduced. The estimated ejection fraction   was in the range of 20% to 25%. Diffuse hypokinesis.   Indeterminant diastolic function (atrial fibrillation). - Aortic valve: There was no stenosis. - Mitral valve: Mildly calcified annulus. There was trivial   regurgitation. - Left atrium: The atrium was severely dilated. - Right ventricle: The cavity size was normal. Systolic function   was moderately reduced. - Right atrium: The atrium was moderately dilated. - Pulmonary arteries: No complete TR doppler jet so unable to   estimate PA systolic pressure. - Systemic veins: IVC measured 1.9 cm with < 50% respirophasic   variation, suggesting RA pressure 8 mmHg. - Pericardium, extracardiac: A trivial pericardial effusion was   identified posterior to the heart.  Impressions:  - The patient was in atrial fibrillation. Mild to moderate LV   dilation with mild LV hypertrophy. EF 20-25%, diffuse   hypokinesis. Normal RV size with moderately decreased systolic   function. Biatrial enlargement. Left atrium 51 mm  MRI-FINDINGS: Brain: An acute/subacute nonhemorrhagic infarct is evident within the left superior cerebellum. No acute supratentorial infarct is present. Subtle T2 changes are associated with  the infarct suggesting a more acute timeframe.  A remote cortical infarct is present in the right occipital lobe medially. No other focal infarct is present.  Mild periventricular T2 changes are within normal limits for age. The ventricles are normal size. No significant extra-axial fluid collection is present.  Vascular: Flow is present in the major intracranial arteries.  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 The skullbase is otherwise within normal limits. The sella turcica is enlarged. No mass lesion is present. Midline sagittal structures are otherwise unremarkable. Degenerative changes are noted in the upper cervical spine with moderate cervical stenosis suggested at C4-5.  Sinuses/Orbits: The paranasal sinuses and the mastoid air cells are clear. The globes and orbits are within normal limits.  IMPRESSION: 1. Acute nonhemorrhagic left superior cerebellar infarct. 2. Normal flow is present within the major arteries of the circle of Willis. 3. Remote right occipital lobe infarct.     Assessment and Plan: 1. Persisitent Afib/flutter Recent successful cardioversion but had symptomatic brady and CVA s/p cardioversion Sotalol stopped and decided to rate control for now, d/c instructions say to consider another cardioversion    after being on coumadin x one month Metoprolol at 25 mg bid Stated no missed doses of eliquis but decision was made to change to coumadin and bridge with Lovenox which she finished  shots on Saturday INR today 3.49 and pt has been instructed in adjustment of her warfarin. She has been made an appointment in the coumadin clinic at [facility] 1/15.  2. HTN stable  3. Acute on chronic HF, EF 20-25% Fluid status improved, weight is down 10-12 lbs. Her weight has been stable since d/c on 40 mg lasix and she will continue this although  d/c instructions said 60 mg lasix a day. Avoid salt bmet   F/u PCP  1/11 and Dr. [name] 1/22 afib clinic as needed  Butch Penny C. [place], [facility] [address] [place], [place] [address] [phone]

## 2016-12-01 NOTE — Telephone Encounter (Signed)
[facility] Nurse called pt regarding her hospital visit.  Pt is confused about her furosemide (LASIX) 40 MG tablet [[id number]]   Bottle says she should take 1 tablet and her discharge paper states 1 1/2 tablet.   Please call pt at [phone]

## 2016-12-01 NOTE — Telephone Encounter (Signed)
I have added note to patient's visit with cardiology today---lori/referrals to check to make sure patient keeps appt

## 2016-12-04 ENCOUNTER — Encounter: Payer: Self-pay | Admitting: Internal Medicine

## 2016-12-04 ENCOUNTER — Other Ambulatory Visit (INDEPENDENT_AMBULATORY_CARE_PROVIDER_SITE_OTHER): Payer: Managed Care, Other (non HMO)

## 2016-12-04 ENCOUNTER — Ambulatory Visit (INDEPENDENT_AMBULATORY_CARE_PROVIDER_SITE_OTHER): Payer: Managed Care, Other (non HMO) | Admitting: Internal Medicine

## 2016-12-04 VITALS — BP 130/80 | HR 76 | Temp 97.8°F | Resp 20 | Wt 242.0 lb

## 2016-12-04 DIAGNOSIS — N183 Chronic kidney disease, stage 3 unspecified: Secondary | ICD-10-CM

## 2016-12-04 DIAGNOSIS — Z8673 Personal history of transient ischemic attack (TIA), and cerebral infarction without residual deficits: Secondary | ICD-10-CM

## 2016-12-04 DIAGNOSIS — I5022 Chronic systolic (congestive) heart failure: Secondary | ICD-10-CM

## 2016-12-04 DIAGNOSIS — I10 Essential (primary) hypertension: Secondary | ICD-10-CM

## 2016-12-04 DIAGNOSIS — I481 Persistent atrial fibrillation: Secondary | ICD-10-CM | POA: Diagnosis not present

## 2016-12-04 DIAGNOSIS — I63442 Cerebral infarction due to embolism of left cerebellar artery: Secondary | ICD-10-CM

## 2016-12-04 DIAGNOSIS — E785 Hyperlipidemia, unspecified: Secondary | ICD-10-CM

## 2016-12-04 DIAGNOSIS — I4819 Other persistent atrial fibrillation: Secondary | ICD-10-CM

## 2016-12-04 LAB — BASIC METABOLIC PANEL
BUN: 21 mg/dL (ref 6–23)
CO2: 34 mEq/L — ABNORMAL HIGH (ref 19–32)
CREATININE: 1.17 mg/dL (ref 0.40–1.20)
Calcium: 9.5 mg/dL (ref 8.4–10.5)
Chloride: 103 mEq/L (ref 96–112)
GFR: 60.01 mL/min (ref >60.00)
GLUCOSE: 96 mg/dL (ref 70–99)
POTASSIUM: 4.3 meq/L (ref 3.5–5.1)
Sodium: 143 mEq/L (ref 135–145)

## 2016-12-04 MED ORDER — ROSUVASTATIN CALCIUM 10 MG PO TABS: 10.0000 mg | ORAL_TABLET | Freq: Every day | ORAL | 3 refills | Status: DC

## 2016-12-04 MED ORDER — DICLOFENAC SODIUM 1 % TD GEL: 4.0000 g | Freq: Four times a day (QID) | TRANSDERMAL | 11 refills | Status: DC | PRN

## 2016-12-04 NOTE — Assessment & Plan Note (Signed)
stable overall by history and exam, recent data reviewed with pt, and pt to continue medical treatment as before,  to f/u any worsening symptoms or concerns Lab Results  Component Value Date   LDLCALC 89 11/21/2016   To start the crestor in light of the stroke

## 2016-12-04 NOTE — Assessment & Plan Note (Signed)
With recent bump due to diuresis, for f/u lab today

## 2016-12-04 NOTE — Progress Notes (Signed)
Pre visit review using our clinic review tool, if applicable. No additional management support is needed unless otherwise documented below in the visit note. 

## 2016-12-04 NOTE — Assessment & Plan Note (Signed)
Compensated by exam, not sure today wt is accurate, cont same tx,  to f/u any worsening symptoms or concerns

## 2016-12-04 NOTE — Progress Notes (Signed)
Subjective:    Patient ID: Patricia Wagner, female    DOB: 1951-08-31, 66 y.o.   MRN: [id number]  HPI  Here to f/u with hx of CAD, systolic CHF, diverticulosis, HLD, CKD, morbid obesity, with symptoms onset 12/28 with acute CVA, PAF with symptomatic bradycardia, and acute on chronic syst CHF with acute hypoxic resp failure. CVA started per pt with primarily slurred speech and ? left sided weakness, and all mostly resolved except for slight LUE weakness.  MRI with acute nonhemorrhagic left superior cerebellar infarct. patient had an acute stroke while being anticoagulated with Eliquis. Therefore pt switched to anti-coagulation with Coumadin and bridging with Lovenox.  Currently on low dose metoprolol 25 bid, plans to f/u with cardiology at one mo post d/c.  Pt diuresed in patient with wt down 10 lbs, o2 weaned off, but did have a small increase Cr with diuresis and felt prudent to check f/u BMP today.  Wt up today per sfaff but Pt denies chest pain, increased sob or doe, wheezing, orthopnea, PND, increased LE swelling, palpitations, dizziness or syncope. Wt Readings from Last 3 Encounters:  12/04/16 242 lb (109.8 kg)  12/01/16 230 lb 3.2 oz (104.4 kg)  11/25/16 232 lb 11.2 oz (105.6 kg)  has HH with PT, RN several times per wk.  No overt bleeding on coumadin. Has f/u appt INR for jan 15, then cardiology jan 22.  Pt denies fever, wt loss, night sweats, loss of appetite, or other constitutional symptoms  Not taking statin as per pt she does not have high chol.  Has ongoing left knee pain, cannot get up on exam table today, asks for volt gel prn refill, declines other such as sport med referral Past Medical History:  Diagnosis Date  . Arthritis   . CAD in native artery, s/p cardiac cath with non obstructive CAD 10/24/2016  . CHF (congestive heart failure) ([place])   . DIVERTICULITIS, HX OF 07/25/2007  . DIVERTICULOSIS, COLON 07/22/2007  . Dyspnea   . Edema, peripheral    a. chronic BLE edema, R>L. Prior  trauma from dog attack and accident.  Marland Kitchen HYPERLIPIDEMIA 02/03/2008  . Hypersomnia    declines w/u  . Hypertension   . MENOPAUSAL DISORDER 01/09/2011  . Morbid obesity ([place]) 07/22/2007  . NICM (nonischemic cardiomyopathy) ([place]) 10/24/2016  . Raynaud's syndrome 07/22/2007  . THYROID NODULE, RIGHT 01/04/2010  . VITAMIN D DEFICIENCY 01/09/2011   Qualifier: Diagnosis of  By: [name], [name]    Past Surgical History:  Procedure Laterality Date  . CARDIAC CATHETERIZATION N/A 10/23/2016   Procedure: Left Heart Cath and Coronary Angiography;  Surgeon: [name], MD;  Location: [place] CV LAB;  Service: Cardiovascular;  Laterality: N/A;  . CARDIOVERSION N/A 10/31/2016   Procedure: CARDIOVERSION;  Surgeon: [name], MD;  Location: [facility] ENDOSCOPY;  Service: Cardiovascular;  Laterality: N/A;  . CARDIOVERSION N/A 11/03/2016   Procedure: CARDIOVERSION;  Surgeon: [name], MD;  Location: [facility] ENDOSCOPY;  Service: Cardiovascular;  Laterality: N/A;  . CARDIOVERSION N/A 11/18/2016   Procedure: CARDIOVERSION;  Surgeon: [name], MD;  Location: [facility] ENDOSCOPY;  Service: Cardiovascular;  Laterality: N/A;  . CHOLECYSTECTOMY    . COLONOSCOPY    . PARTIAL HYSTERECTOMY     1 OVARY LEFT  . POLYPECTOMY    . TEE WITHOUT CARDIOVERSION N/A 10/31/2016   Procedure: TRANSESOPHAGEAL ECHOCARDIOGRAM (TEE);  Surgeon: [name], MD;  Location: [place];  Service: Cardiovascular;  Laterality: N/A;    reports  that she quit smoking about 6 weeks ago. Her smoking use included Cigarettes. She smoked 0.50 packs per day. She has never used smokeless tobacco. She reports that she drinks about 2.4 oz of alcohol per week . She reports that she does not use drugs. family history includes Asthma in her mother; Diabetes in her father; Heart disease in her father and sister; Lung disease in her sister. Allergies  Allergen Reactions  . Ace Inhibitors Palpitations   Current Outpatient Prescriptions on File Prior  to Visit  Medication Sig Dispense Refill  . acetaminophen (TYLENOL) 325 MG tablet Take 2 tablets (650 mg total) by mouth every 4 (four) hours as needed for headache or mild pain. (Patient taking differently: Take 325 mg by mouth every 4 (four) hours as needed for headache or mild pain. )    . aspirin EC 81 MG EC tablet Take 1 tablet (81 mg total) by mouth daily. 30 tablet 1  . enoxaparin (LOVENOX) 100 MG/ML injection Inject 1 mL (100 mg total) into the skin every 12 (twelve) hours. 10 Syringe 0  . furosemide (LASIX) 40 MG tablet Take 1 tablet (40 mg total) by mouth daily. 30 tablet 1  . losartan (COZAAR) 25 MG tablet Take 1 tablet (25 mg total) by mouth daily. 30 tablet 1  . magnesium oxide (MAG-OX) 400 MG tablet Take 400 mg by mouth daily.    . metoprolol tartrate (LOPRESSOR) 25 MG tablet Take 1 tablet (25 mg total) by mouth 2 (two) times daily. 60 tablet 1  . potassium chloride SA (K-DUR,KLOR-CON) 20 MEQ tablet Take 1 tablet (20 mEq total) by mouth daily. 30 tablet 1  . rosuvastatin (CRESTOR) 10 MG tablet Take 1 tablet (10 mg total) by mouth daily. 90 tablet 3  . warfarin (COUMADIN) 7.5 MG tablet Take 1 tablet (7.5mg ) by mouth daily except on Fridays take 1/2 tablet (3.75mg ). 30 tablet 0   No current facility-administered medications on file prior to visit.    Review of Systems  Constitutional: Negative for unusual diaphoresis or night sweats HENT: Negative for ear swelling or discharge Eyes: Negative for worsening visual haziness  Respiratory: Negative for choking and stridor.   Gastrointestinal: Negative for distension or worsening eructation Genitourinary: Negative for retention or change in urine volume.  Musculoskeletal: Negative for other MSK pain or swelling Skin: Negative for color change and worsening wound Neurological: Negative for tremors and numbness other than noted  Psychiatric/Behavioral: Negative for decreased concentration or agitation other than above   All other system  neg per pt    Objective:   Physical Exam BP 130/80   Pulse 76   Temp 97.8 F (36.6 C) (Oral)   Resp 20   Wt 242 lb (109.8 kg)   SpO2 94%   BMI 40.27 kg/m  VS noted,  Constitutional: Pt appears in no apparent distress HENT: Head: NCAT.  Right Ear: External ear normal.  Left Ear: External ear normal.  Eyes: . Pupils are equal, round, and reactive to light. Conjunctivae and EOM are normal Neck: Normal range of motion. Neck supple.  Cardiovascular: Normal rate and regular rhythm.   Pulmonary/Chest: Effort normal and breath sounds without rales or wheezing.  Abd:  Soft, NT, ND, + BS Neurological: Pt is alert. Not confused , motor grossly intact, no slurred speech noted Skin: Skin is warm. No rash, no LE edema, no bruising Psychiatric: Pt behavior is normal. No agitation.  No other exam findings    Assessment & Plan:

## 2016-12-04 NOTE — Patient Instructions (Addendum)
OK to start the Crestor as this helps reduce the risk of stroke  Please continue all other medications as before, including the topical gel for knee pain  Please see Dr Smith/sports medicine in this office for the left knee if the pain is not improved  Please have the pharmacy call with any other refills you may need.  Please continue your efforts at being more active, low cholesterol diet, and weight control.  Please keep your appointments with your specialists as you may have planned  Please go to the LAB in the Basement (turn left off the elevator) for the tests to be done today  You will be contacted by phone if any changes need to be made immediately.  Otherwise, you will receive a letter about your results with an explanation, but please check with MyChart first.  Please remember to sign up for MyChart if you have not done so, as this will be important to you in the future with finding out test results, communicating by private email, and scheduling acute appointments online when needed.  Please return in 3 months, or sooner if needed  Please stop smoking

## 2016-12-04 NOTE — Progress Notes (Signed)
HPI: Follow-up atrial fibrillation. Patient admitted with new-onset atrial fibrillation November 2017.  Echo showed ejection fraction 20-25%, biatrial enlargement and mild mitral regurgitation. Cardiac catheterization November 2017 showed no obstructive coronary disease. Patient underwent TEE guided cardioversion 10/31/16. Atrial fibrillation recurred and she was seen by Dr. Rayann Heman and placed on sotalol. Repeat DCCV 11/03/16. Patient seen in follow-up and noted to be in atrial flutter. She underwent repeat cardioversion on 11/18/2016. Patient was then found to have an acute nonhemorrhagic left superior cerebellar infarct. CTA showed occlusion of the left superior cerebellar artery. She was bradycardic and sotalol discontinued. DOAC DCed and pt placed on coumadin. She subsequently had repeat echocardiogram December 29 showed ejection fraction 20-25%. Noted to have recurrent atrial fibrillation in follow-up office visit with atrial fibrillation clinic January 8. It was felt that she should continue on Coumadin with therapeutic INR for at least 4 weeks and then cardioversion could be considered. Dr. Lovena Le saw patient during most recent admission and felt she would likely require amiodarone to maintain sinus rhythm. Since last seen, she describes some increased dyspnea on exertion and pedal edema. No orthopnea, PND, chest pain, palpitations or syncope.  Current Outpatient Prescriptions  Medication Sig Dispense Refill  . acetaminophen (TYLENOL) 325 MG tablet Take 2 tablets (650 mg total) by mouth every 4 (four) hours as needed for headache or mild pain. (Patient taking differently: Take 325 mg by mouth every 4 (four) hours as needed for headache or mild pain. )    . aspirin EC 81 MG EC tablet Take 1 tablet (81 mg total) by mouth daily. 30 tablet 1  . diclofenac sodium (VOLTAREN) 1 % GEL Apply 4 g topically 4 (four) times daily as needed. 400 g 11  . enoxaparin (LOVENOX) 100 MG/ML injection Inject 1 mL  (100 mg total) into the skin every 12 (twelve) hours. 10 Syringe 0  . furosemide (LASIX) 40 MG tablet Take 1 tablet (40 mg total) by mouth daily. 30 tablet 1  . losartan (COZAAR) 25 MG tablet Take 1 tablet (25 mg total) by mouth daily. 30 tablet 1  . magnesium oxide (MAG-OX) 400 MG tablet Take 400 mg by mouth daily.    . metoprolol tartrate (LOPRESSOR) 25 MG tablet Take 1 tablet (25 mg total) by mouth 2 (two) times daily. 60 tablet 1  . potassium chloride SA (K-DUR,KLOR-CON) 20 MEQ tablet Take 1 tablet (20 mEq total) by mouth daily. 30 tablet 1  . rosuvastatin (CRESTOR) 10 MG tablet Take 1 tablet (10 mg total) by mouth daily. 90 tablet 3  . warfarin (COUMADIN) 7.5 MG tablet Take 1 tablet (7.5mg ) by mouth daily except on Fridays take 1/2 tablet (3.75mg ). 30 tablet 0   No current facility-administered medications for this visit.      Past Medical History:  Diagnosis Date  . Arthritis   . CAD in native artery, s/p cardiac cath with non obstructive CAD 10/24/2016  . CHF (congestive heart failure) ([place])   . DIVERTICULITIS, HX OF 07/25/2007  . DIVERTICULOSIS, COLON 07/22/2007  . Dyspnea   . Edema, peripheral    a. chronic BLE edema, R>L. Prior trauma from dog attack and accident.  Marland Kitchen HYPERLIPIDEMIA 02/03/2008  . Hypersomnia    declines w/u  . Hypertension   . MENOPAUSAL DISORDER 01/09/2011  . Morbid obesity ([place]) 07/22/2007  . NICM (nonischemic cardiomyopathy) ([place]) 10/24/2016  . Raynaud's syndrome 07/22/2007  . THYROID NODULE, RIGHT 01/04/2010  . VITAMIN D DEFICIENCY 01/09/2011   Qualifier: Diagnosis  of  By: [name], [name]     Past Surgical History:  Procedure Laterality Date  . CARDIAC CATHETERIZATION N/A 10/23/2016   Procedure: Left Heart Cath and Coronary Angiography;  Surgeon: [name], MD;  Location: [place] CV LAB;  Service: Cardiovascular;  Laterality: N/A;  . CARDIOVERSION N/A 10/31/2016   Procedure: CARDIOVERSION;  Surgeon: [name], MD;  Location[facility] ENDOSCOPY;   Service: Cardiovascular;  Laterality: N/A;  . CARDIOVERSION N/A 11/03/2016   Procedure: CARDIOVERSION;  Surgeon: [name], MD;  Location: [facility] ENDOSCOPY;  Service: Cardiovascular;  Laterality: N/A;  . CARDIOVERSION N/A 11/18/2016   Procedure: CARDIOVERSION;  Surgeon: [name], MD;  Location: [facility] ENDOSCOPY;  Service: Cardiovascular;  Laterality: N/A;  . CHOLECYSTECTOMY    . COLONOSCOPY    . PARTIAL HYSTERECTOMY     1 OVARY LEFT  . POLYPECTOMY    . TEE WITHOUT CARDIOVERSION N/A 10/31/2016   Procedure: TRANSESOPHAGEAL ECHOCARDIOGRAM (TEE);  Surgeon: [name], MD;  Location: [facility] ENDOSCOPY;  Service: Cardiovascular;  Laterality: N/A;    Social History   Social History  . Marital status: Single    Spouse name: N/A  . Number of children: 1  . Years of education: N/A   Occupational History  . Printing     Social History Main Topics  . Smoking status: Former Smoker    Packs/day: 0.50    Types: Cigarettes    Quit date: 10/23/2016  . Smokeless tobacco: Never Used     Comment: Smoked for 50 years  . Alcohol use 2.4 oz/week    4 Cans of beer per week     Comment: Intermittent  . Drug use: No  . Sexual activity: Not on file   Other Topics Concern  . Not on file   Social History Narrative  . No narrative on file    Family History  Problem Relation Age of Onset  . Asthma Mother   . Diabetes Father   . Heart disease Father     Died of presumed heart attack - 7s  . Lung disease Sister   . Heart disease Sister     Twin sister has heart issue, unclear what kind  . Thyroid disease Neg Hx     ROS: no fevers or chills, productive cough, hemoptysis, dysphasia, odynophagia, melena, hematochezia, dysuria, hematuria, rash, seizure activity, orthopnea, PND, claudication. Remaining systems are negative.  Physical Exam: Well-developed obese in no acute distress.  Skin is warm and dry.  HEENT is normal.  Neck is supple.  Chest is clear to auscultation with normal  expansion.  Cardiovascular exam is tachycardic and irregular  Abdominal exam nontender or distended. No masses palpated. Extremities show trace to 1+ edema. neuro grossly intact  ECG-Atrial fibrillation at a rate of 110. Cannot rule out prior anterior infarct. Nonspecific ST changes.  A/P  1 persistent atrial fibrillation-the patient remains in atrial fibrillation today. Her rate is elevated. I will increase metoprolol to 50 mg twice a day. Continue Coumadin. Discontinue aspirin. She will require an antiarrhythmic long-term to help maintain sinus rhythm. Dr. Lovena Le suggested amiodarone. Once her INRs have been therapeutic for 4 weeks we will plan to proceed with TEE guided cardioversion. I would suggest TEE given her recent stroke. We will initiate amiodarone prior to that procedure.  2 cardiomyopathy-likely tachycardia mediated. Previous catheterization showed no coronary disease. We will increase metoprolol to 50 mg twice a day. Ultimately we will transition to Toprol once dose is stable. Continue  ARB. Repeat echocardiogram 3 months after sinus is reestablished to see if LV function has improved.  3 acute on chronic systolic congestive heart failure-patient is volume overloaded on examination. Increase Lasix to 40 mg twice a day. Check potassium and renal function in 5 days. She will follow-up with one of my assistance in 5 days to make sure that her symptoms are improving and that her rate is controlled. I will see her back in 4 weeks.  4 hypertension-blood pressure controlled. Continue present medications.  5 hyperlipidemia-continue statin.  6 Tobacco abuse-pt has DCed.  Kirk Ruths, MD

## 2016-12-04 NOTE — Assessment & Plan Note (Signed)
Stable rate, cont lower dose BB

## 2016-12-04 NOTE — Assessment & Plan Note (Signed)
stable overall by history and exam, recent data reviewed with pt, and pt to continue medical treatment as before,  to f/u any worsening symptoms or concerns BP Readings from Last 3 Encounters:  12/04/16 130/80  12/01/16 122/84  11/25/16 114/71

## 2016-12-04 NOTE — Assessment & Plan Note (Signed)
Stable, nonhemorrhagic on eliquis, to cont current tx including coumadin, to also start the crestor, encouraged compliance with this

## 2016-12-08 ENCOUNTER — Telehealth: Payer: Self-pay | Admitting: Physician Assistant

## 2016-12-08 ENCOUNTER — Ambulatory Visit (INDEPENDENT_AMBULATORY_CARE_PROVIDER_SITE_OTHER): Payer: Managed Care, Other (non HMO) | Admitting: Pharmacist

## 2016-12-08 DIAGNOSIS — Z7901 Long term (current) use of anticoagulants: Secondary | ICD-10-CM

## 2016-12-08 LAB — POCT INR: INR: 6.2

## 2016-12-08 LAB — PROTIME-INR
INR: 5.9 — ABNORMAL HIGH
Prothrombin Time: 58.4 s — ABNORMAL HIGH (ref 9.0–11.5)

## 2016-12-08 NOTE — Telephone Encounter (Signed)
[facility] labs called with stat lab result PT 58.4 INR 5.9  This high INR has already been addressed by Coumadin clinic. This draw was to confirm. Will forward to pharmD. Dayna Dunn PA-C

## 2016-12-15 ENCOUNTER — Ambulatory Visit (INDEPENDENT_AMBULATORY_CARE_PROVIDER_SITE_OTHER): Payer: Managed Care, Other (non HMO) | Admitting: Pharmacist Clinician (PhC)/ Clinical Pharmacy Specialist

## 2016-12-15 ENCOUNTER — Encounter: Payer: Self-pay | Admitting: Cardiology

## 2016-12-15 ENCOUNTER — Ambulatory Visit (INDEPENDENT_AMBULATORY_CARE_PROVIDER_SITE_OTHER): Payer: Managed Care, Other (non HMO) | Admitting: Cardiology

## 2016-12-15 VITALS — BP 132/86 | HR 110 | Ht 65.0 in | Wt 245.0 lb

## 2016-12-15 DIAGNOSIS — I4891 Unspecified atrial fibrillation: Secondary | ICD-10-CM | POA: Diagnosis not present

## 2016-12-15 DIAGNOSIS — I10 Essential (primary) hypertension: Secondary | ICD-10-CM | POA: Diagnosis not present

## 2016-12-15 DIAGNOSIS — I5022 Chronic systolic (congestive) heart failure: Secondary | ICD-10-CM | POA: Diagnosis not present

## 2016-12-15 DIAGNOSIS — Z7901 Long term (current) use of anticoagulants: Secondary | ICD-10-CM | POA: Diagnosis not present

## 2016-12-15 DIAGNOSIS — I4819 Other persistent atrial fibrillation: Secondary | ICD-10-CM

## 2016-12-15 DIAGNOSIS — I481 Persistent atrial fibrillation: Secondary | ICD-10-CM

## 2016-12-15 LAB — POCT INR: INR: 5.9

## 2016-12-15 MED ORDER — METOPROLOL TARTRATE 50 MG PO TABS: 50.0000 mg | ORAL_TABLET | Freq: Two times a day (BID) | ORAL | 3 refills | Status: DC

## 2016-12-15 MED ORDER — FUROSEMIDE 40 MG PO TABS: 40.0000 mg | ORAL_TABLET | Freq: Two times a day (BID) | ORAL | 3 refills | Status: DC

## 2016-12-15 MED ORDER — METOPROLOL TARTRATE 50 MG PO TABS
50.0000 mg | ORAL_TABLET | Freq: Two times a day (BID) | ORAL | 3 refills | Status: DC
Start: 2016-12-15 — End: 2017-03-26

## 2016-12-15 NOTE — Patient Instructions (Signed)
Medication Instructions:   INCREASE FUROSEMIDE TO 40 MG TWICE DAILY  INCREASE METOPROLOL TO 50 MG TWICE DAILY= 2 OF THE 25 MG TABLETS TWICE DAILY  STOP ASPIRIN  Labwork:  Your physician recommends that you return for lab work Friday THIS WEEK=12-19-16  Follow-Up:  Your physician recommends that you schedule a follow-up appointment Friday 12-19-16 WITH APP  Your physician recommends that you schedule a follow-up appointment in: [place]

## 2016-12-15 NOTE — Addendum Note (Signed)
Addended by: Cristopher Estimable on: 12/15/2016 01:15 PM   Modules accepted: Orders

## 2016-12-19 ENCOUNTER — Ambulatory Visit (INDEPENDENT_AMBULATORY_CARE_PROVIDER_SITE_OTHER): Payer: Managed Care, Other (non HMO) | Admitting: Physician Assistant

## 2016-12-19 ENCOUNTER — Encounter: Payer: Self-pay | Admitting: Physician Assistant

## 2016-12-19 ENCOUNTER — Telehealth: Payer: Self-pay | Admitting: Cardiology

## 2016-12-19 VITALS — BP 125/96 | HR 101 | Ht 65.0 in | Wt 245.2 lb

## 2016-12-19 DIAGNOSIS — I5022 Chronic systolic (congestive) heart failure: Secondary | ICD-10-CM

## 2016-12-19 DIAGNOSIS — I4891 Unspecified atrial fibrillation: Secondary | ICD-10-CM | POA: Diagnosis not present

## 2016-12-19 DIAGNOSIS — Z7901 Long term (current) use of anticoagulants: Secondary | ICD-10-CM | POA: Diagnosis not present

## 2016-12-19 DIAGNOSIS — Z79899 Other long term (current) drug therapy: Secondary | ICD-10-CM

## 2016-12-19 NOTE — Progress Notes (Signed)
Cardiology Office Note   Date:  12/19/2016   ID:  Shifa, Brisbon 12-Jun-1951, MRN [id number]  PCP:  Cathlean Cower, MD  Cardiologist:  Dr. [name], 12/15/2016  Rosaria Ferries, PA-C   Chief Complaint  Patient presents with  . Follow-up    History of Present Illness: Patricia Wagner is a 66 y.o. female with a history of persistent afib dx 09/2016 s/p TEE/DCCV w/ recurrence>Sotalol>>a fluttler>>DCCV>>bradycardia>>d/c Sotalol. 10/2016 L superior cerebellar infarct 2nd occlusion L superior cerebellar artery, DOAC d/c'd and pt on coumadin; EF 20-25% 09/2016, no change 10/2016.   01/08, afib clinic visit, pt back in afib. Dr Lovena Le suggested amio 01/22, Dr [name] saw, afib rate up>>metoprolol increased, ASA dc'd. Pt to be therapeutic on coumadin for 4 weeks, then have TEE/DCCV. Amio to be started prior to TEE/DCCV. Low EF most likely NICM 2nd tachycardia. CHF worse>>Lasix increased to 40 mg bid, for BMET and office visit today   Patricia Wagner presents for cardiology follow up.  She is not aware of the atrial fib. She feels her breathing is ok.  She has not appetite. Nothing tastes right. Her mouth feels dry. She feels scared to eat anything. She has had problems with incontinence of stool and loose stools. The loose stools have been a problem since last November or December, not clear which.   Sometimes she feels sluggish or drained. She feels better in the last 2 days, pt believes she feels better because she has not taken her coumadin over the last 2 days. She has not been bleeding.   She is frustrated by not being able to maintain sinus rhythm. She is also having some musculoskeletal pain since she fell several months ago, especially in her left knee and right shoulder/arm   Past Medical History:  Diagnosis Date  . Arthritis   . CAD in native artery, s/p cardiac cath with non obstructive CAD 10/24/2016  . CHF (congestive heart failure) ([place])   . DIVERTICULITIS, HX OF 07/25/2007  .  DIVERTICULOSIS, COLON 07/22/2007  . Dyspnea   . Edema, peripheral    a. chronic BLE edema, R>L. Prior trauma from dog attack and accident.  Marland Kitchen HYPERLIPIDEMIA 02/03/2008  . Hypersomnia    declines w/u  . Hypertension   . MENOPAUSAL DISORDER 01/09/2011  . Morbid obesity ([place]) 07/22/2007  . NICM (nonischemic cardiomyopathy) ([place]) 10/24/2016  . Raynaud's syndrome 07/22/2007  . THYROID NODULE, RIGHT 01/04/2010  . VITAMIN D DEFICIENCY 01/09/2011   Qualifier: Diagnosis of  By: [name], [name]     Past Surgical History:  Procedure Laterality Date  . CARDIAC CATHETERIZATION N/A 10/23/2016   Procedure: Left Heart Cath and Coronary Angiography;  Surgeon: [name], MD;  Location: [place] CV LAB;  Service: Cardiovascular;  Laterality: N/A;  . CARDIOVERSION N/A 10/31/2016   Procedure: CARDIOVERSION;  Surgeon: [name], MD;  Location: [facility] ENDOSCOPY;  Service: Cardiovascular;  Laterality: N/A;  . CARDIOVERSION N/A 11/03/2016   Procedure: CARDIOVERSION;  Surgeon: [name], MD;  Location: [facility] ENDOSCOPY;  Service: Cardiovascular;  Laterality: N/A;  . CARDIOVERSION N/A 11/18/2016   Procedure: CARDIOVERSION;  Surgeon: [name], MD;  Location: [facility] ENDOSCOPY;  Service: Cardiovascular;  Laterality: N/A;  . CHOLECYSTECTOMY    . COLONOSCOPY    . PARTIAL HYSTERECTOMY     1 OVARY LEFT  . POLYPECTOMY    . TEE WITHOUT CARDIOVERSION N/A 10/31/2016   Procedure: TRANSESOPHAGEAL ECHOCARDIOGRAM (TEE);  Surgeon: [name], MD;  Location:  [facility] ENDOSCOPY;  Service: Cardiovascular;  Laterality: N/A;    Current Outpatient Prescriptions  Medication Sig Dispense Refill  . acetaminophen (TYLENOL) 325 MG tablet Take 650 mg by mouth every 6 (six) hours as needed.    . diclofenac sodium (VOLTAREN) 1 % GEL Apply 4 g topically 4 (four) times daily as needed. 400 g 11  . enoxaparin (LOVENOX) 100 MG/ML injection Inject 1 mL (100 mg total) into the skin every 12 (twelve) hours. 10 Syringe 0  . furosemide  (LASIX) 40 MG tablet Take 1 tablet (40 mg total) by mouth 2 (two) times daily. 180 tablet 3  . losartan (COZAAR) 25 MG tablet Take 1 tablet (25 mg total) by mouth daily. 30 tablet 1  . magnesium oxide (MAG-OX) 400 MG tablet Take 400 mg by mouth daily.    . metoprolol (LOPRESSOR) 50 MG tablet Take 1 tablet (50 mg total) by mouth 2 (two) times daily. 180 tablet 3  . potassium chloride SA (K-DUR,KLOR-CON) 20 MEQ tablet Take 1 tablet (20 mEq total) by mouth daily. 30 tablet 1  . rosuvastatin (CRESTOR) 10 MG tablet Take 1 tablet (10 mg total) by mouth daily. 90 tablet 3  . warfarin (COUMADIN) 7.5 MG tablet Take 1 tablet (7.5mg ) by mouth daily except on Fridays take 1/2 tablet (3.75mg ). 30 tablet 0   No current facility-administered medications for this visit.     Allergies:   Ace inhibitors    Social History:  The patient  reports that she quit smoking about 8 weeks ago. Her smoking use included Cigarettes. She smoked 0.50 packs per day. She has never used smokeless tobacco. She reports that she drinks about 2.4 oz of alcohol per week . She reports that she does not use drugs.   Family History:  The patient's family history includes Asthma in her mother; Diabetes in her father; Heart disease in her father and sister; Lung disease in her sister.    ROS:  Please see the history of present illness. All other systems are reviewed and negative.    PHYSICAL EXAM: VS:  BP (!) 125/96   Pulse (!) 101   Ht 5\' 5"  (1.651 m)   Wt 245 lb 3.2 oz (111.2 kg)   BMI 40.80 kg/m  , BMI Body mass index is 40.8 kg/m. GEN: Well nourished, well developed, female in no acute distress  HEENT: normal for age  Neck: no JVD, no carotid bruit, no masses Cardiac: Irreg R&R; 2/6 murmur, no rubs, or gallops Respiratory:  clear to auscultation bilaterally, normal work of breathing GI: soft, nontender, nondistended, + BS MS: no deformity or atrophy; trace edema; distal pulses are 2+ in all 4 extremities   Skin: warm and  dry, no rash Neuro:  Strength and sensation are intact Psych: euthymic mood, full affect   EKG:  EKG is ordered today. The ekg ordered today demonstrates atrial fib, rapid ventricular response with a heart rate of 101   Recent Labs: 10/20/2016: TSH 1.007 11/13/2016: Magnesium 1.9 11/20/2016: ALT 51; B Natriuretic Peptide 1,555.6 11/24/2016: Hemoglobin 14.3; Platelets 139 12/04/2016: BUN 21; Creatinine, Ser 1.17; Potassium 4.3; Sodium 143    Lipid Panel    Component Value Date/Time   CHOL 144 11/21/2016 0541   TRIG 60 11/21/2016 0541   HDL 43 11/21/2016 0541   CHOLHDL 3.3 11/21/2016 0541   VLDL 12 11/21/2016 0541   LDLCALC 89 11/21/2016 0541   LDLDIRECT 121.6 03/15/2012 1646     Wt Readings from Last 3 Encounters:  12/19/16 245 lb 3.2 oz (111.2 kg)  12/15/16 245 lb (111.1 kg)  12/04/16 242 lb (109.8 kg)     Other studies Reviewed: Additional studies/ records that were reviewed today include: Office notes, hospital records and testing.  ASSESSMENT AND PLAN:  1.  Persistent atrial fibrillation: I explained to patient that we felt like her heart would get stronger more easily if she were in sinus rhythm. I explained that she would not stay in sinus rhythm without a medication to keep her there. I explained that I would discuss the timing with Dr. [name] but once she had been therapeutic on her Coumadin for 4 weeks, we could bring her in for another TEE/cardioversion. Both procedures are preferred because of her CVA history.  I explained that we would call her to let her know when to start the amiodarone and how to take it. However, her nausea needs to improve before we give her the amiodarone. Once she has been loaded with amiodarone, she can come in for the TEE cardioversion.  2. Anticoagulation: She has not had any bleeding issues despite a high INR. This is being followed closely by the Coumadin clinic.  3. Chronic systolic CHF: Her weight is unchanged and she feels her  breathing is okay. She doesn't feel like doing much but just feels generally sluggish or drained all the time. Once she is back in sinus rhythm, we will better be able to assess if her symptoms are secondary to her CHF or were secondary to the atrial fib.   Current medicines are reviewed at length with the patient today.  The patient has concerns regarding medicines. Concerns were addressed  The following changes have been made:  no change  Labs/ tests ordered today include:   Orders Placed This Encounter  Procedures  . Basic Metabolic Panel (BMET)  . Magnesium  . EKG 12-Lead     Disposition:   FU with Dr. [name]  Signed, Barrett, Suanne Marker, PA-C  12/19/2016 6:11 PM    [facility] Phone: [phone]; Fax: [phone]  This note was written with the assistance of speech recognition software. Please excuse any transcriptional errors.

## 2016-12-19 NOTE — Telephone Encounter (Signed)
New Message   Per Marla physical therapist with [facility] she is discharging pt today, and set her up with a home exercise program. Per therapist thinks pt can benefit from out patient therapy, but at the moment patient is not willing. Pt has appt today, and was hoping they could discuss out patient therapy with her.

## 2016-12-19 NOTE — Patient Instructions (Addendum)
Medication Instructions:  CONTINUE lasix 40 mg two times daily.    STOP Mag-ox.  Labwork: Have lab work completed TODAY (BMET, Magnesium)  Testing/Procedures: Rosaria Ferries PA will speak with Dr. [name] and we will call you to get you scheduled for a TEE cardioversion.    Your physician has recommended that you have a Cardioversion (DCCV). Electrical Cardioversion uses a jolt of electricity to your heart either through paddles or wired patches attached to your chest. This is a controlled, usually prescheduled, procedure. Defibrillation is done under light anesthesia in the hospital, and you usually go home the day of the procedure. This is done to get your heart back into a normal rhythm. You are not awake for the procedure. Please see the instruction sheet given to you today.   Follow-Up: Keep follow up with Dr. [name] on 2/21 at 4:15 pm at [facility] office.  Any Other Special Instructions Will Be Listed Below (If Applicable).  Continue to limit daily sodium intake to 2000 mg   If you need a refill on your cardiac medications before your next appointment, please call your pharmacy.

## 2016-12-19 NOTE — Telephone Encounter (Signed)
Patient to see Patricia Wagner at [place] note for review of these needs at appointment.

## 2016-12-20 LAB — BASIC METABOLIC PANEL
BUN: 16 mg/dL (ref 7–25)
CO2: 34 mmol/L — ABNORMAL HIGH (ref 20–31)
Calcium: 8.8 mg/dL (ref 8.6–10.4)
Chloride: 99 mmol/L (ref 98–110)
Creat: 1.23 mg/dL — ABNORMAL HIGH (ref 0.50–0.99)
Glucose, Bld: 94 mg/dL (ref 65–99)
POTASSIUM: 3.9 mmol/L (ref 3.5–5.3)
SODIUM: 143 mmol/L (ref 135–146)

## 2016-12-20 LAB — MAGNESIUM: MAGNESIUM: 1.8 mg/dL (ref 1.5–2.5)

## 2016-12-22 ENCOUNTER — Ambulatory Visit (INDEPENDENT_AMBULATORY_CARE_PROVIDER_SITE_OTHER): Payer: Managed Care, Other (non HMO) | Admitting: Pharmacist Clinician (PhC)/ Clinical Pharmacy Specialist

## 2016-12-22 DIAGNOSIS — Z7901 Long term (current) use of anticoagulants: Secondary | ICD-10-CM

## 2016-12-22 LAB — POCT INR: INR: 4.1

## 2016-12-25 ENCOUNTER — Other Ambulatory Visit: Payer: Self-pay | Admitting: Internal Medicine

## 2016-12-26 ENCOUNTER — Telehealth: Payer: Self-pay | Admitting: Cardiology

## 2016-12-26 NOTE — Telephone Encounter (Signed)
Spoke to patient and addressed questions. Wanted to know when cardioversion was going to be. Reviewed notes. Discussed she'll need to be followed in coumadin clinic for stable INRs x4 weeks before cardioversion. Pt voiced understanding. Aware to call if new concerns/questions.

## 2016-12-26 NOTE — Telephone Encounter (Signed)
New Message  Pt voiced she is wanting to speak to nurse.  Pt voiced she spoke with someone before and needing someone to contact her back.  Please f/u

## 2016-12-29 ENCOUNTER — Ambulatory Visit (INDEPENDENT_AMBULATORY_CARE_PROVIDER_SITE_OTHER): Payer: Managed Care, Other (non HMO) | Admitting: Pharmacist

## 2016-12-29 DIAGNOSIS — Z7901 Long term (current) use of anticoagulants: Secondary | ICD-10-CM

## 2016-12-29 LAB — POCT INR: INR: 2.7

## 2017-01-02 ENCOUNTER — Other Ambulatory Visit: Payer: Self-pay | Admitting: Nurse Practitioner

## 2017-01-03 ENCOUNTER — Other Ambulatory Visit: Payer: Self-pay | Admitting: Nurse Practitioner

## 2017-01-05 NOTE — Progress Notes (Signed)
HPI: Follow-up atrial fibrillation. Patient admitted with new-onset atrial fibrillation November 2017.  Echo showed ejection fraction 20-25%, biatrial enlargement and mild mitral regurgitation. Cardiac catheterization November 2017 showed no obstructive coronary disease. Patient underwent TEE guided cardioversion 10/31/16. Atrial fibrillation recurred and she was seen by Dr. Rayann Heman and placed on sotalol. Repeat DCCV 11/03/16. Patient seen in follow-up and noted to be in atrial flutter. She underwent repeat cardioversion on 11/18/2016. Patient was then found to have an acute nonhemorrhagic left superior cerebellar infarct. CTA showed occlusion of the left superior cerebellar artery. She was bradycardic and sotalol discontinued. DOAC DCed and pt placed on coumadin. She subsequently had repeat echocardiogram December 29 showed ejection fraction 20-25%. Noted to have recurrent atrial fibrillation in follow-up office visit with atrial fibrillation clinic January 8. It was felt that she should continue on Coumadin with therapeutic INR for at least 4 weeks and then cardioversion could be considered. Dr. Lovena Le saw patient during most recent admission and felt she would likely require amiodarone to maintain sinus rhythm. Since last seen, patient has missed her last 2 days of Lasix as she ran out. She feels some increased dyspnea related to that. She denies chest pain, palpitations or syncope. No bleeding.  Current Outpatient Prescriptions  Medication Sig Dispense Refill  . acetaminophen (TYLENOL) 325 MG tablet Take 650 mg by mouth every 6 (six) hours as needed.    . diclofenac sodium (VOLTAREN) 1 % GEL Apply 4 g topically 4 (four) times daily as needed. 400 g 11  . furosemide (LASIX) 40 MG tablet Take 1 tablet (40 mg total) by mouth 2 (two) times daily. 180 tablet 3  . KLOR-CON M20 20 MEQ tablet TAKE 1 TABLET (20 MEQ TOTAL) BY MOUTH DAILY. 30 tablet 1  . losartan (COZAAR) 25 MG tablet TAKE 1 TABLET (25 MG  TOTAL) BY MOUTH DAILY. 30 tablet 6  . metoprolol (LOPRESSOR) 50 MG tablet Take 1 tablet (50 mg total) by mouth 2 (two) times daily. 180 tablet 3  . rosuvastatin (CRESTOR) 10 MG tablet Take 1 tablet (10 mg total) by mouth daily. 90 tablet 3  . warfarin (COUMADIN) 7.5 MG tablet Take 1/2 to 1 tablet daily OR as directed by coumadin clinic 60 tablet 1   No current facility-administered medications for this visit.      Past Medical History:  Diagnosis Date  . Arthritis   . CAD in native artery, s/p cardiac cath with non obstructive CAD 10/24/2016  . CHF (congestive heart failure) ([place])   . DIVERTICULITIS, HX OF 07/25/2007  . DIVERTICULOSIS, COLON 07/22/2007  . Dyspnea   . Edema, peripheral    a. chronic BLE edema, R>L. Prior trauma from dog attack and accident.  Marland Kitchen HYPERLIPIDEMIA 02/03/2008  . Hypersomnia    declines w/u  . Hypertension   . MENOPAUSAL DISORDER 01/09/2011  . Morbid obesity ([place]) 07/22/2007  . NICM (nonischemic cardiomyopathy) ([place]) 10/24/2016  . Raynaud's syndrome 07/22/2007  . THYROID NODULE, RIGHT 01/04/2010  . VITAMIN D DEFICIENCY 01/09/2011   Qualifier: Diagnosis of  By: [name], [name]     Past Surgical History:  Procedure Laterality Date  . CARDIAC CATHETERIZATION N/A 10/23/2016   Procedure: Left Heart Cath and Coronary Angiography;  Surgeon: [name], MD;  Location: [place] [facility];  Service: Cardiovascular;  Laterality: N/A;  . CARDIOVERSION N/A 10/31/2016   Procedure: CARDIOVERSION;  Surgeon: [name], MD;  Location: [place];  Service: Cardiovascular;  Laterality: N/A;  .  CARDIOVERSION N/A 11/03/2016   Procedure: CARDIOVERSION;  Surgeon: [name], MD;  Location: [facility] ENDOSCOPY;  Service: Cardiovascular;  Laterality: N/A;  . CARDIOVERSION N/A 11/18/2016   Procedure: CARDIOVERSION;  Surgeon: [name], MD;  Location: [facility] ENDOSCOPY;  Service: Cardiovascular;  Laterality: N/A;  . CHOLECYSTECTOMY    . COLONOSCOPY    . PARTIAL HYSTERECTOMY      1 OVARY LEFT  . POLYPECTOMY    . TEE WITHOUT CARDIOVERSION N/A 10/31/2016   Procedure: TRANSESOPHAGEAL ECHOCARDIOGRAM (TEE);  Surgeon: [name], MD;  Location: [facility] ENDOSCOPY;  Service: Cardiovascular;  Laterality: N/A;    Social History   Social History  . Marital status: Single    Spouse name: N/A  . Number of children: 1  . Years of education: N/A   Occupational History  . Printing     Social History Main Topics  . Smoking status: Former Smoker    Packs/day: 0.50    Types: Cigarettes    Quit date: 10/23/2016  . Smokeless tobacco: Never Used     Comment: Smoked for 50 years  . Alcohol use 2.4 oz/week    4 Cans of beer per week     Comment: Intermittent  . Drug use: No  . Sexual activity: Not on file   Other Topics Concern  . Not on file   Social History Narrative  . No narrative on file    Family History  Problem Relation Age of Onset  . Asthma Mother   . Diabetes Father   . Heart disease Father     Died of presumed heart attack - 40s  . Lung disease Sister   . Heart disease Sister     Twin sister has heart issue, unclear what kind  . Thyroid disease Neg Hx     ROS: no fevers or chills, productive cough, hemoptysis, dysphasia, odynophagia, melena, hematochezia, dysuria, hematuria, rash, seizure activity, orthopnea, PND, claudication. Remaining systems are negative.  Physical Exam: Well-developed obese in no acute distress.  Skin is warm and dry.  HEENT is normal.  Neck is supple.  Chest is clear to auscultation with normal expansion.  Cardiovascular exam is irregular and tachycardic.  Abdominal exam nontender or distended. No masses palpated. Extremities show trace edema. neuro grossly intact  ECG-Atrial fibrillation with rapid ventricular response at 117. Nonspecific ST changes. Right axis deviation.  A/P  1 persistent atrial fibrillation-patient's heart rate remains elevated. Continue metoprolol. Her blood pressure is borderline and I'm  hesitant to advance further. Add amiodarone 200 mg twice a day for 4 weeks (for rate control). Then decrease to 200 mg daily. Continue Coumadin. I will see her back in 4-6 weeks and we will arrange TEE guided cardioversion at that point.  2 cardiomyopathy-likely tachycardia mediated. Continue metoprolol and ARB. I will transition to Toprol later once medical regimen stable. Once we reestablish sinus rhythm however plan to repeat her echocardiogram in 3 months later to see if LV function has improved.  3 acute on chronic systolic congestive heart failure-she is mildly volume overloaded today but has run out of her Lasix. Resume 40 mg twice a day. Check potassium and renal function in 1 week.  4 hypertension-blood pressure controlled.  5 hyperlipidemia-continue statin.  Kirk Ruths, MD

## 2017-01-05 NOTE — Telephone Encounter (Signed)
This is Dr. [name] pt 

## 2017-01-12 ENCOUNTER — Telehealth: Payer: Self-pay | Admitting: Cardiology

## 2017-01-12 MED ORDER — FUROSEMIDE 40 MG PO TABS: 40.0000 mg | ORAL_TABLET | Freq: Two times a day (BID) | ORAL | 3 refills | Status: DC

## 2017-01-12 NOTE — Telephone Encounter (Signed)
Spoke with patient, will not refill warfarin until appt Wednesday.  She is on 7.5 mg 1/2 tab daily.  Would prefer to give her lower strength tabs at next appt.  Patient agreeable.

## 2017-01-12 NOTE — Telephone Encounter (Signed)
New Message   *STAT* If patient is at the pharmacy, call can be transferred to refill team.   1. Which medications need to be refilled? (please list name of each medication and dose if known) warfarin (COUMADIN) 7.5 MG tablet,  furosemide (LASIX) 40 MG tablet Take 1 tablet (40 mg total) by mouth 2 (two) times daily.     2. Which pharmacy/location (including street and city if local pharmacy) is medication to be sent to? [facility]  3. Do they need a 30 day or 90 day supply? 90 day supply   Pt states that the LASIX is coming through incorrectly to her pharmacy and they will not refill it. She states that they are saying that it is supposed to say 2 times daily however it says 1 time daily.

## 2017-01-12 NOTE — Telephone Encounter (Signed)
Spoke with pt, she is taking the furosemide twice daily, up dated script sent to the pharmacy. Will forward to the [facility] clinic to refill the warfarin.

## 2017-01-14 ENCOUNTER — Ambulatory Visit (INDEPENDENT_AMBULATORY_CARE_PROVIDER_SITE_OTHER): Payer: Managed Care, Other (non HMO) | Admitting: Pharmacist

## 2017-01-14 ENCOUNTER — Ambulatory Visit (INDEPENDENT_AMBULATORY_CARE_PROVIDER_SITE_OTHER): Payer: Managed Care, Other (non HMO) | Admitting: Cardiology

## 2017-01-14 ENCOUNTER — Encounter: Payer: Self-pay | Admitting: Cardiology

## 2017-01-14 VITALS — BP 110/80 | HR 117 | Ht 65.0 in | Wt 247.0 lb

## 2017-01-14 DIAGNOSIS — I5022 Chronic systolic (congestive) heart failure: Secondary | ICD-10-CM | POA: Diagnosis not present

## 2017-01-14 DIAGNOSIS — I4819 Other persistent atrial fibrillation: Secondary | ICD-10-CM

## 2017-01-14 DIAGNOSIS — I481 Persistent atrial fibrillation: Secondary | ICD-10-CM | POA: Diagnosis not present

## 2017-01-14 DIAGNOSIS — I4891 Unspecified atrial fibrillation: Secondary | ICD-10-CM | POA: Diagnosis not present

## 2017-01-14 DIAGNOSIS — Z7901 Long term (current) use of anticoagulants: Secondary | ICD-10-CM | POA: Diagnosis not present

## 2017-01-14 DIAGNOSIS — I10 Essential (primary) hypertension: Secondary | ICD-10-CM | POA: Diagnosis not present

## 2017-01-14 LAB — POCT INR: INR: 1.4

## 2017-01-14 MED ORDER — AMIODARONE HCL 200 MG PO TABS: ORAL_TABLET | ORAL | 3 refills | Status: DC

## 2017-01-14 MED ORDER — WARFARIN SODIUM 7.5 MG PO TABS: ORAL_TABLET | ORAL | 1 refills | Status: DC

## 2017-01-14 MED ORDER — FUROSEMIDE 40 MG PO TABS: 40.0000 mg | ORAL_TABLET | Freq: Two times a day (BID) | ORAL | 3 refills | Status: DC

## 2017-01-14 NOTE — Patient Instructions (Signed)
Medication Instructions:   START AMIODARONE 200 MG ONE TABLET TWICE DAILY X 4 WEEKS THEN DECREASE TO 200 MG ONCE DAILY  FUROSEMIDE REFILL SENT TO THE PHARMACY  Labwork:  Your physician recommends that you return for lab work in:ONE WEEK  Follow-Up:  Your physician recommends that you schedule a follow-up appointment in: [place]

## 2017-01-21 LAB — BASIC METABOLIC PANEL
BUN: 24 mg/dL (ref 7–25)
CO2: 33 mmol/L — AB (ref 20–31)
Calcium: 8.9 mg/dL (ref 8.6–10.4)
Chloride: 103 mmol/L (ref 98–110)
Creat: 1.57 mg/dL — ABNORMAL HIGH (ref 0.50–0.99)
GLUCOSE: 122 mg/dL — AB (ref 65–99)
Potassium: 4.1 mmol/L (ref 3.5–5.3)
Sodium: 142 mmol/L (ref 135–146)

## 2017-01-29 ENCOUNTER — Ambulatory Visit (INDEPENDENT_AMBULATORY_CARE_PROVIDER_SITE_OTHER): Payer: Managed Care, Other (non HMO) | Admitting: *Deleted

## 2017-01-29 DIAGNOSIS — Z7901 Long term (current) use of anticoagulants: Secondary | ICD-10-CM | POA: Diagnosis not present

## 2017-01-29 LAB — POCT INR: INR: 6.1

## 2017-01-30 ENCOUNTER — Telehealth: Payer: Self-pay | Admitting: Pharmacist Clinician (PhC)/ Clinical Pharmacy Specialist

## 2017-01-30 NOTE — Telephone Encounter (Signed)
Patient would like to speak with Dr. Jacalyn Lefevre nurse about her short term disability paperwork

## 2017-01-30 NOTE — Telephone Encounter (Signed)
Spoke with pt, aware I have not seen anything regarding disability for her. She reports they are going to send it.

## 2017-01-30 NOTE — Telephone Encounter (Signed)
Patient to take warfarin 3.75 mg daily starting on Sunday. Repeat INR scheduled for Friday March 16 at 3:45 pm

## 2017-02-06 ENCOUNTER — Ambulatory Visit (INDEPENDENT_AMBULATORY_CARE_PROVIDER_SITE_OTHER): Payer: Managed Care, Other (non HMO) | Admitting: Pharmacist

## 2017-02-06 DIAGNOSIS — Z7901 Long term (current) use of anticoagulants: Secondary | ICD-10-CM | POA: Diagnosis not present

## 2017-02-06 LAB — POCT INR: INR: 4.1

## 2017-02-13 ENCOUNTER — Encounter: Payer: Self-pay | Admitting: Pharmacist

## 2017-02-13 ENCOUNTER — Telehealth: Payer: Self-pay | Admitting: Pharmacist

## 2017-02-13 NOTE — Telephone Encounter (Signed)
Per patient request I called her short term disability provider.  [employer] (short term disability) is requesting copy of the office visit note for 02/06/2017 and any visit that will keep the patient out of work.  Please send to [facility] @ [phone]  Account number [phone]

## 2017-02-16 ENCOUNTER — Other Ambulatory Visit: Payer: Self-pay | Admitting: Internal Medicine

## 2017-02-18 ENCOUNTER — Other Ambulatory Visit: Payer: Self-pay | Admitting: Obstetrics and Gynecology

## 2017-02-18 ENCOUNTER — Telehealth: Payer: Self-pay | Admitting: Cardiology

## 2017-02-18 DIAGNOSIS — R234 Changes in skin texture: Secondary | ICD-10-CM

## 2017-02-18 NOTE — Telephone Encounter (Signed)
Please call pt,she says she is having side effects from the Amiodarone

## 2017-02-18 NOTE — Telephone Encounter (Signed)
This encounter was created in error - please disregard.

## 2017-02-18 NOTE — Telephone Encounter (Signed)
Left message for pt to call.

## 2017-02-20 ENCOUNTER — Ambulatory Visit
Admission: RE | Admit: 2017-02-20 | Discharge: 2017-02-20 | Disposition: A | Discharge status: Home / Self Care | Payer: Managed Care, Other (non HMO) | Source: Ambulatory Visit | Attending: Obstetrics and Gynecology | Admitting: Obstetrics and Gynecology

## 2017-02-20 ENCOUNTER — Other Ambulatory Visit: Payer: Self-pay | Admitting: Obstetrics and Gynecology

## 2017-02-20 DIAGNOSIS — R234 Changes in skin texture: Secondary | ICD-10-CM

## 2017-02-20 DIAGNOSIS — N6320 Unspecified lump in the left breast, unspecified quadrant: Secondary | ICD-10-CM

## 2017-02-25 ENCOUNTER — Telehealth: Payer: Self-pay | Admitting: Cardiology

## 2017-02-25 ENCOUNTER — Encounter: Payer: Self-pay | Admitting: Internal Medicine

## 2017-02-25 ENCOUNTER — Ambulatory Visit (INDEPENDENT_AMBULATORY_CARE_PROVIDER_SITE_OTHER): Payer: Managed Care, Other (non HMO) | Admitting: Pharmacist

## 2017-02-25 ENCOUNTER — Ambulatory Visit (INDEPENDENT_AMBULATORY_CARE_PROVIDER_SITE_OTHER): Payer: Managed Care, Other (non HMO) | Admitting: Internal Medicine

## 2017-02-25 VITALS — BP 110/78 | HR 64 | Temp 98.3°F | Wt 269.0 lb

## 2017-02-25 DIAGNOSIS — N183 Chronic kidney disease, stage 3 unspecified: Secondary | ICD-10-CM

## 2017-02-25 DIAGNOSIS — I4891 Unspecified atrial fibrillation: Secondary | ICD-10-CM | POA: Diagnosis not present

## 2017-02-25 DIAGNOSIS — I5022 Chronic systolic (congestive) heart failure: Secondary | ICD-10-CM | POA: Diagnosis not present

## 2017-02-25 DIAGNOSIS — E785 Hyperlipidemia, unspecified: Secondary | ICD-10-CM

## 2017-02-25 DIAGNOSIS — Z7901 Long term (current) use of anticoagulants: Secondary | ICD-10-CM

## 2017-02-25 DIAGNOSIS — I10 Essential (primary) hypertension: Secondary | ICD-10-CM | POA: Diagnosis not present

## 2017-02-25 LAB — POCT INR: INR: 3.6

## 2017-02-25 MED ORDER — FUROSEMIDE 40 MG PO TABS: 60.0000 mg | ORAL_TABLET | Freq: Two times a day (BID) | ORAL | 3 refills | Status: DC

## 2017-02-25 NOTE — Telephone Encounter (Signed)
Left message for pt to call.

## 2017-02-25 NOTE — Patient Instructions (Addendum)
Ok to increase the lasix (fluid pill) to 1 and 1/2 pills TWICE per day   Please return for LAB only in 2 weeks (the potassium and kidney function)  Please check your weight every day and write it down, and bring to Dr [name] at your next appt  Please continue all other medications as before, and refills have been done if requested.  Please have the pharmacy call with any other refills you may need.  Please keep your appointments with your specialists as you may have planned - Dr [name] later this month  Please return in 4 months, or sooner if needed

## 2017-02-25 NOTE — Telephone Encounter (Signed)
Patient states that she is having some issues with her disabilities, in regards to paperwork. Please call to discuss,thanks.

## 2017-02-25 NOTE — Progress Notes (Signed)
Pre visit review using our clinic review tool, if applicable. No additional management support is needed unless otherwise documented below in the visit note. 

## 2017-02-25 NOTE — Telephone Encounter (Signed)
Routed to Medical Records - I do not see documentation that prior note was addressed.

## 2017-02-25 NOTE — Progress Notes (Signed)
Subjective:    Patient ID: Patricia Wagner, female    DOB: 09/29/1951, 66 y.o.   MRN: [id number]  HPI  Here to f/u; overall doing ok,  Pt denies chest pain, increasing sob or doe, wheezing, orthopnea, PND, increased LE swelling, palpitations, dizziness or syncope.  Pt denies new neurological symptoms such as new headache, or facial or extremity weakness or numbness.  Pt denies polydipsia, polyuria, or low sugar episode.   Pt denies new neurological symptoms such as new headache, or facial or extremity weakness or numbness.   Pt states overall good compliance with meds, mostly trying to follow appropriate diet, with wt overall stable,  but little exercise however.  Has gained significant wt and bilat leg swelling. Wt Readings from Last 3 Encounters:  02/25/17 269 lb (122 kg)  01/14/17 247 lb (112 kg)  12/19/16 245 lb 3.2 oz (111.2 kg)   Past Medical History:  Diagnosis Date  . Arthritis   . CAD in native artery, s/p cardiac cath with non obstructive CAD 10/24/2016  . CHF (congestive heart failure) ([place])   . DIVERTICULITIS, HX OF 07/25/2007  . DIVERTICULOSIS, COLON 07/22/2007  . Dyspnea   . Edema, peripheral    a. chronic BLE edema, R>L. Prior trauma from dog attack and accident.  Marland Kitchen HYPERLIPIDEMIA 02/03/2008  . Hypersomnia    declines w/u  . Hypertension   . MENOPAUSAL DISORDER 01/09/2011  . Morbid obesity ([place]) 07/22/2007  . NICM (nonischemic cardiomyopathy) ([place]) 10/24/2016  . Raynaud's syndrome 07/22/2007  . THYROID NODULE, RIGHT 01/04/2010  . VITAMIN D DEFICIENCY 01/09/2011   Qualifier: Diagnosis of  By: [name], [name]    Past Surgical History:  Procedure Laterality Date  . CARDIAC CATHETERIZATION N/A 10/23/2016   Procedure: Left Heart Cath and Coronary Angiography;  Surgeon: [name], MD;  Location: [place] [facility];  Service: Cardiovascular;  Laterality: N/A;  . CARDIOVERSION N/A 10/31/2016   Procedure: CARDIOVERSION;  Surgeon: [name], MD;  Location: [facility] ENDOSCOPY;   Service: Cardiovascular;  Laterality: N/A;  . CARDIOVERSION N/A 11/03/2016   Procedure: CARDIOVERSION;  Surgeon: [name], MD;  Location: [facility] ENDOSCOPY;  Service: Cardiovascular;  Laterality: N/A;  . CARDIOVERSION N/A 11/18/2016   Procedure: CARDIOVERSION;  Surgeon: [name], MD;  Location: [facility] ENDOSCOPY;  Service: Cardiovascular;  Laterality: N/A;  . CHOLECYSTECTOMY    . COLONOSCOPY    . PARTIAL HYSTERECTOMY     1 OVARY LEFT  . POLYPECTOMY    . TEE WITHOUT CARDIOVERSION N/A 10/31/2016   Procedure: TRANSESOPHAGEAL ECHOCARDIOGRAM (TEE);  Surgeon: [name], MD;  Location: [facility] ENDOSCOPY;  Service: Cardiovascular;  Laterality: N/A;    reports that she quit smoking about 4 months ago. Her smoking use included Cigarettes. She smoked 0.50 packs per day. She has never used smokeless tobacco. She reports that she drinks about 2.4 oz of alcohol per week . She reports that she does not use drugs. family history includes Asthma in her mother; Diabetes in her father; Heart disease in her father and sister; Lung disease in her sister. Allergies  Allergen Reactions  . Ace Inhibitors Palpitations   Current Outpatient Prescriptions on File Prior to Visit  Medication Sig Dispense Refill  . acetaminophen (TYLENOL) 325 MG tablet Take 650 mg by mouth every 6 (six) hours as needed.    Marland Kitchen amiodarone (PACERONE) 200 MG tablet TAKE ONE TABLET TWICE DAILY X 4 WEEKS THEN DECREASE TO ONE TABLET DAILY 120 tablet 3  . diclofenac  sodium (VOLTAREN) 1 % GEL Apply 4 g topically 4 (four) times daily as needed. 400 g 11  . KLOR-CON M20 20 MEQ tablet TAKE 1 TABLET (20 MEQ TOTAL) BY MOUTH DAILY. 30 tablet 1  . losartan (COZAAR) 25 MG tablet TAKE 1 TABLET (25 MG TOTAL) BY MOUTH DAILY. 30 tablet 6  . metoprolol (LOPRESSOR) 50 MG tablet Take 1 tablet (50 mg total) by mouth 2 (two) times daily. 180 tablet 3  . rosuvastatin (CRESTOR) 10 MG tablet Take 1 tablet (10 mg total) by mouth daily. 90 tablet 3  . warfarin  (COUMADIN) 7.5 MG tablet Take 1/2 to 1 tablet daily OR as directed by coumadin clinic 60 tablet 1   No current facility-administered medications on file prior to visit.    Review of Systems  Constitutional: Negative for other unusual diaphoresis or sweats HENT: Negative for ear discharge or swelling Eyes: Negative for other worsening visual disturbances Respiratory: Negative for stridor or other swelling  Gastrointestinal: Negative for worsening distension or other blood Genitourinary: Negative for retention or other urinary change Musculoskeletal: Negative for other MSK pain or swelling Skin: Negative for color change or other new lesions Neurological: Negative for worsening tremors and other numbness  Psychiatric/Behavioral: Negative for worsening agitation or other fatigue All other system neg per pt    Objective:   Physical Exam BP 110/78   Pulse 64   Temp 98.3 F (36.8 C) (Oral)   Wt 269 lb (122 kg)   SpO2 98%   BMI 44.76 kg/m  VS noted,  Constitutional: Pt appears in NAD HENT: Head: NCAT.  Right Ear: External ear normal.  Left Ear: External ear normal.  Eyes: . Pupils are equal, round, and reactive to light. Conjunctivae and EOM are normal Nose: without d/c or deformity Neck: Neck supple. Gross normal ROM Cardiovascular: Normal rate and regular rhythm.   Pulmonary/Chest: Effort normal and breath sounds without rales or wheezing.  Abd:  Soft, NT, ND, + BS, no organomegaly Neurological: Pt is alert. At baseline orientation, motor grossly intact Skin: Skin is warm. No rashes, other new lesions, 1-2+ bilat LE edema Psychiatric: Pt behavior is normal without agitation  No other exam findings  Transthoracic Echocardiography - summary Study Date: 11/21/2016  ------------------------------------------------------------------- LV EF: 20% -   25%  ------------------------------------------------------------------- Indications:      434.91  CVA.  ------------------------------------------------------------------- Impressions:  - The patient was in atrial fibrillation. Mild to moderate LV   dilation with mild LV hypertrophy. EF 20-25%, diffuse   hypokinesis. Normal RV size with moderately decreased systolic   function. Biatrial enlargement.     Assessment & Plan:

## 2017-02-26 ENCOUNTER — Ambulatory Visit
Admission: RE | Admit: 2017-02-26 | Discharge: 2017-02-26 | Disposition: A | Discharge status: Home / Self Care | Payer: Managed Care, Other (non HMO) | Source: Ambulatory Visit | Attending: Obstetrics and Gynecology | Admitting: Obstetrics and Gynecology

## 2017-02-26 ENCOUNTER — Other Ambulatory Visit: Payer: Self-pay | Admitting: Obstetrics and Gynecology

## 2017-02-26 DIAGNOSIS — N6320 Unspecified lump in the left breast, unspecified quadrant: Secondary | ICD-10-CM

## 2017-02-26 NOTE — Telephone Encounter (Signed)
Follow up    Pt says she missed a call and is returning call

## 2017-02-26 NOTE — Telephone Encounter (Signed)
Follow Up    Per pt returning phone call, and was unsure who it was. She thought the persons name was Claiborne Billings. Pt stated call was regarding her short term disability... Requesting call back

## 2017-02-26 NOTE — Telephone Encounter (Signed)
Patricia Wagner, Medical Records aware patient is returning call.

## 2017-02-26 NOTE — Telephone Encounter (Signed)
Patient initially calling regarding Disability [employer] request.  Assured her we had sent all records that [facility] has requested.  Deferred note to Evette Cristal, RN to discuss medication /returning to work with her.

## 2017-02-27 NOTE — Telephone Encounter (Signed)
Left message for pt to call.

## 2017-02-27 NOTE — Telephone Encounter (Signed)
Spoke with pt, she reports she stopped the amiodarone because she had symptoms that were listed on the paper from the pharmacy.

## 2017-02-27 NOTE — Telephone Encounter (Signed)
Follow up    Pt is returning call to [facility].

## 2017-03-01 NOTE — Assessment & Plan Note (Signed)
stable overall by history and exam, recent data reviewed with pt, and pt to continue medical treatment as before,  to f/u any worsening symptoms or concerns Lab Results  Component Value Date   CREATININE 1.57 (H) 01/20/2017  for f/u lab 2 wks with increased lasix

## 2017-03-01 NOTE — Assessment & Plan Note (Signed)
Mild uncontrolled, to increased lasix to 60 bid, f/u lab 2 wks, f/u card as planned

## 2017-03-01 NOTE — Assessment & Plan Note (Signed)
stable overall by history and exam, recent data reviewed with pt, and pt to continue medical treatment as before,  to f/u any worsening symptoms or concerns BP Readings from Last 3 Encounters:  02/25/17 110/78  01/14/17 110/80  12/19/16 (!) 125/96

## 2017-03-01 NOTE — Assessment & Plan Note (Signed)
stable overall by history and exam, recent data reviewed with pt, and pt to continue medical treatment as before,  to f/u any worsening symptoms or concerns Lab Results  Component Value Date   LDLCALC 89 11/21/2016

## 2017-03-02 ENCOUNTER — Telehealth: Payer: Self-pay | Admitting: Cardiology

## 2017-03-02 ENCOUNTER — Encounter: Payer: Self-pay | Admitting: Cardiology

## 2017-03-02 NOTE — Telephone Encounter (Signed)
Patient calling states that she has been experiencing trouble with her paperwork for short-term disability. Patient states that our office needs to put that she is on short-term disability. Please call to discuss,thanks.

## 2017-03-02 NOTE — Telephone Encounter (Signed)
Please advise 

## 2017-03-07 ENCOUNTER — Other Ambulatory Visit: Payer: Self-pay | Admitting: Cardiology

## 2017-03-09 NOTE — Telephone Encounter (Signed)
Rx(s) sent to pharmacy electronically.  

## 2017-03-11 NOTE — Progress Notes (Signed)
HPI: Follow-up atrial fibrillation. Patient admitted with new-onset atrial fibrillation November 2017. Echo showed ejection fraction 20-25%, biatrial enlargement and mild mitral regurgitation. Cardiac catheterization November 2017 showed no obstructive coronary disease. Patient underwent TEE guided cardioversion 10/31/16. Atrial fibrillation recurred and she was seen by Dr. Rayann Heman and placed on sotalol. Repeat DCCV 11/03/16.Patient seen in follow-up and noted to be in atrial flutter. She underwent repeat cardioversion on 11/18/2016. Patient was then found to have an acute nonhemorrhagic left superior cerebellar infarct. CTA showed occlusion of the left superior cerebellar artery. She was bradycardic and sotalol discontinued. DOAC DCed and pt placed on coumadin. She subsequently had repeat echocardiogram December 29 showed ejection fraction 20-25%. Noted to have recurrent atrial fibrillation in follow-up office visit with atrial fibrillation clinic January 8. It was felt that she should continue on Coumadin with therapeutic INR for at least 4 weeks and then cardioversion could be considered. Dr. Lovena Le saw patient during most recent admission and felt she would likely require amiodarone to maintain sinus rhythm which was added at last ov. Since last seen, patient denies dyspnea, chest pain, palpitations or syncope. She has bilateral lower extremity edema. Note she discontinued her amiodarone approximately 4 weeks ago and she thought it may be causing her edema.  Current Outpatient Prescriptions  Medication Sig Dispense Refill  . acetaminophen (TYLENOL) 325 MG tablet Take 650 mg by mouth every 6 (six) hours as needed.    Marland Kitchen amiodarone (PACERONE) 200 MG tablet TAKE ONE TABLET TWICE DAILY X 4 WEEKS THEN DECREASE TO ONE TABLET DAILY 120 tablet 3  . diclofenac sodium (VOLTAREN) 1 % GEL Apply 4 g topically 4 (four) times daily as needed. 400 g 11  . furosemide (LASIX) 40 MG tablet Take 1.5 tablets (60 mg  total) by mouth 2 (two) times daily. 270 tablet 3  . KLOR-CON M20 20 MEQ tablet TAKE 1 TABLET (20 MEQ TOTAL) BY MOUTH DAILY. 30 tablet 10  . losartan (COZAAR) 25 MG tablet TAKE 1 TABLET (25 MG TOTAL) BY MOUTH DAILY. 30 tablet 6  . metoprolol (LOPRESSOR) 50 MG tablet Take 1 tablet (50 mg total) by mouth 2 (two) times daily. 180 tablet 3  . rosuvastatin (CRESTOR) 10 MG tablet Take 1 tablet (10 mg total) by mouth daily. 90 tablet 3  . warfarin (COUMADIN) 7.5 MG tablet Take 1/2 to 1 tablet daily OR as directed by coumadin clinic 60 tablet 1   No current facility-administered medications for this visit.      Past Medical History:  Diagnosis Date  . Arthritis   . CAD in native artery, s/p cardiac cath with non obstructive CAD 10/24/2016  . CHF (congestive heart failure) ([place])   . DIVERTICULITIS, HX OF 07/25/2007  . DIVERTICULOSIS, COLON 07/22/2007  . Dyspnea   . Edema, peripheral    a. chronic BLE edema, R>L. Prior trauma from dog attack and accident.  Marland Kitchen HYPERLIPIDEMIA 02/03/2008  . Hypersomnia    declines w/u  . Hypertension   . MENOPAUSAL DISORDER 01/09/2011  . Morbid obesity ([place]) 07/22/2007  . NICM (nonischemic cardiomyopathy) ([place]) 10/24/2016  . Raynaud's syndrome 07/22/2007  . THYROID NODULE, RIGHT 01/04/2010  . VITAMIN D DEFICIENCY 01/09/2011   Qualifier: Diagnosis of  By: [name], [name]     Past Surgical History:  Procedure Laterality Date  . CARDIAC CATHETERIZATION N/A 10/23/2016   Procedure: Left Heart Cath and Coronary Angiography;  Surgeon: [name], MD;  Location: [place] [facility];  Service: Cardiovascular;  Laterality: N/A;  . CARDIOVERSION N/A 10/31/2016   Procedure: CARDIOVERSION;  Surgeon: [name], MD;  Location: [facility] ENDOSCOPY;  Service: Cardiovascular;  Laterality: N/A;  . CARDIOVERSION N/A 11/03/2016   Procedure: CARDIOVERSION;  Surgeon: [name], MD;  Location: [facility] ENDOSCOPY;  Service: Cardiovascular;  Laterality: N/A;  . CARDIOVERSION N/A  11/18/2016   Procedure: CARDIOVERSION;  Surgeon: [name], MD;  Location: [facility] ENDOSCOPY;  Service: Cardiovascular;  Laterality: N/A;  . CHOLECYSTECTOMY    . COLONOSCOPY    . PARTIAL HYSTERECTOMY     1 OVARY LEFT  . POLYPECTOMY    . TEE WITHOUT CARDIOVERSION N/A 10/31/2016   Procedure: TRANSESOPHAGEAL ECHOCARDIOGRAM (TEE);  Surgeon: [name], MD;  Location: [facility] ENDOSCOPY;  Service: Cardiovascular;  Laterality: N/A;    Social History   Social History  . Marital status: Single    Spouse name: N/A  . Number of children: 1  . Years of education: N/A   Occupational History  . Printing     Social History Main Topics  . Smoking status: Former Smoker    Packs/day: 0.50    Types: Cigarettes    Quit date: 10/23/2016  . Smokeless tobacco: Never Used     Comment: Smoked for 50 years  . Alcohol use 2.4 oz/week    4 Cans of beer per week     Comment: Intermittent  . Drug use: No  . Sexual activity: Not on file   Other Topics Concern  . Not on file   Social History Narrative  . No narrative on file    Family History  Problem Relation Age of Onset  . Asthma Mother   . Diabetes Father   . Heart disease Father     Died of presumed heart attack - 49s  . Lung disease Sister   . Heart disease Sister     Twin sister has heart issue, unclear what kind  . Thyroid disease Neg Hx     ROS: no fevers or chills, productive cough, hemoptysis, dysphasia, odynophagia, melena, hematochezia, dysuria, hematuria, rash, seizure activity, orthopnea, PND, claudication. Remaining systems are negative.  Physical Exam: Well-developed obese in no acute distress.  Skin is warm and dry.  HEENT is normal.  Neck is supple.  Chest is clear to auscultation with normal expansion. No wheeze. Cardiovascular exam is irregular and tachycardic Abdominal exam nontender or distended. No masses palpated. Extremities show 2+ edema. neuro grossly intact  ECG- Atrial fibrillation with rapid ventricular  response, cannot rule out prior anterior infarct, nonspecific ST changes. personally reviewed  A/P  1 Persistent atrial fibrillation-patient remains in atrial fibrillation and her rate is elevated. She discontinued amiodarone on her own previously because she thought it was causing pedal edema. I think this is unlikely. I have asked her to resume at 200 mg twice a day for 2 weeks. She will then reduce to 200 mg daily thereafter. Continue Coumadin. Check INR today. If therapeutic we will arrange a TEE guided cardioversion early next week. Hopefully amiodarone will help maintain sinus rhythm. Continue metoprolol.  2 cardiomyopathy-this is felt to be tachycardia mediated. Continue Toprol and ARB. We will plan to repeat echocardiogram 3 months after sinus is reestablished to see if LV function has improved.  3 acute on chronic systolic congestive heart failure-patient is mildly volume overloaded but has significant peripheral edema. Continue present dose of Lasix. Check potassium and renal function. Hopefully reestablishing sinus rhythm will improve her heart failure symptoms.  4 hypertension-blood pressure  controlled. Continue present medications.  5 hyperlipidemia-continue statin.  6 tobacco abuse-patient has resumed smoking. I counseled on discontinuing.  Addendum 1.5; will adjust coumadin; recheck INR Mon and if > 2, TEE DCCV Wed.  Kirk Ruths, MD

## 2017-03-16 ENCOUNTER — Telehealth: Payer: Self-pay | Admitting: Cardiology

## 2017-03-16 NOTE — Telephone Encounter (Signed)
Spoke with pt, she needs a note for her short term disability as to why she is out of work and her treatment plan. She has a follow up appointment 03-09-17 with dr [name]. Will forward for dr [name] review

## 2017-03-16 NOTE — Telephone Encounter (Signed)
Mrs. Daris is calling because she needs to speak w/you about some paperwork . She needs to speak with you today , because she is about to loose her job.Marland Kitchen  Please call .. thanks

## 2017-03-16 NOTE — Telephone Encounter (Signed)
Pt is calling again,says she needs to see you today.

## 2017-03-17 ENCOUNTER — Encounter: Payer: Self-pay | Admitting: *Deleted

## 2017-03-17 NOTE — Telephone Encounter (Signed)
Spoke with pt, aware letter generated and faxed to corey widun at [employer] [phone]. This information was provided to me by the patient. The letter and last office note faxed.

## 2017-03-17 NOTE — Telephone Encounter (Signed)
Ok for short term disability Patricia Wagner

## 2017-03-19 ENCOUNTER — Ambulatory Visit (INDEPENDENT_AMBULATORY_CARE_PROVIDER_SITE_OTHER): Payer: Managed Care, Other (non HMO) | Admitting: Cardiology

## 2017-03-19 ENCOUNTER — Encounter: Payer: Self-pay | Admitting: *Deleted

## 2017-03-19 ENCOUNTER — Ambulatory Visit (INDEPENDENT_AMBULATORY_CARE_PROVIDER_SITE_OTHER): Payer: Managed Care, Other (non HMO) | Admitting: Pharmacist

## 2017-03-19 ENCOUNTER — Encounter: Payer: Self-pay | Admitting: Cardiology

## 2017-03-19 VITALS — BP 127/86 | HR 112 | Ht 65.0 in | Wt 272.8 lb

## 2017-03-19 DIAGNOSIS — I5022 Chronic systolic (congestive) heart failure: Secondary | ICD-10-CM

## 2017-03-19 DIAGNOSIS — I428 Other cardiomyopathies: Secondary | ICD-10-CM

## 2017-03-19 DIAGNOSIS — I10 Essential (primary) hypertension: Secondary | ICD-10-CM | POA: Diagnosis not present

## 2017-03-19 DIAGNOSIS — I4891 Unspecified atrial fibrillation: Secondary | ICD-10-CM

## 2017-03-19 DIAGNOSIS — Z7901 Long term (current) use of anticoagulants: Secondary | ICD-10-CM | POA: Diagnosis not present

## 2017-03-19 LAB — POCT INR: INR: 1.5

## 2017-03-19 MED ORDER — AMIODARONE HCL 200 MG PO TABS
ORAL_TABLET | ORAL | 3 refills | Status: DC
Start: 2017-03-19 — End: 2017-05-16

## 2017-03-19 NOTE — Patient Instructions (Addendum)
Medication Instructions:   RESTART AMIODARONE 200 MG TWICE DAILY X 2 WEEKS THEN DECREASE TO ONE TABLET DAILY  Labwork:  Your physician recommends that you HAVE LAB WORK Monday AFTER INR CHECK  Testing/Procedures:  Your physician has requested that you have a TEE/Cardioversion. During a TEE, sound waves are used to create images of your heart. It provides your doctor with information about the size and shape of your heart and how well your heart's chambers and valves are working. In this test, a transducer is attached to the end of a flexible tube that is guided down you throat and into your esophagus (the tube leading from your mouth to your stomach) to get a more detailed image of your heart. Once the TEE has determined that a blood clot is not present, the cardioversion begins. Electrical Cardioversion uses a jolt of electricity to your heart either through paddles or wired patches attached to your chest. This is a controlled, usually prescheduled, procedure. This procedure is done at the hospital and you are not awake during the procedure. You usually go home the day of the procedure. Please see the instruction sheet given to you today for more information.   Follow-Up:  Your physician recommends that you schedule a follow-up appointment in: [place]

## 2017-03-20 NOTE — Addendum Note (Signed)
Addended by: Zebedee Iba on: 03/20/2017 02:13 PM   Modules accepted: Orders

## 2017-03-23 ENCOUNTER — Ambulatory Visit: Admission: RE | Admit: 2017-03-23 | Payer: Managed Care, Other (non HMO) | Source: Ambulatory Visit

## 2017-03-23 ENCOUNTER — Ambulatory Visit (INDEPENDENT_AMBULATORY_CARE_PROVIDER_SITE_OTHER): Payer: Managed Care, Other (non HMO) | Admitting: Pharmacist

## 2017-03-23 DIAGNOSIS — Z7901 Long term (current) use of anticoagulants: Secondary | ICD-10-CM | POA: Diagnosis not present

## 2017-03-23 LAB — POCT INR: INR: 3.6

## 2017-03-25 ENCOUNTER — Ambulatory Visit (HOSPITAL_COMMUNITY): Payer: Managed Care, Other (non HMO) | Admitting: Certified Registered"

## 2017-03-25 ENCOUNTER — Ambulatory Visit (HOSPITAL_BASED_OUTPATIENT_CLINIC_OR_DEPARTMENT_OTHER)
Admission: RE | Admit: 2017-03-25 | Discharge: 2017-03-25 | Disposition: A | Discharge status: Home / Self Care | Payer: Managed Care, Other (non HMO) | Source: Ambulatory Visit | Attending: Cardiology | Admitting: Cardiology

## 2017-03-25 ENCOUNTER — Encounter (HOSPITAL_COMMUNITY): Payer: Self-pay | Admitting: Certified Registered Nurse Anesthetist

## 2017-03-25 ENCOUNTER — Encounter (HOSPITAL_COMMUNITY)
Admission: RE | Disposition: A | Discharge status: Home / Self Care | Payer: Self-pay | Source: Ambulatory Visit | Attending: Cardiology

## 2017-03-25 ENCOUNTER — Ambulatory Visit (HOSPITAL_COMMUNITY)
Admission: RE | Admit: 2017-03-25 | Discharge: 2017-03-25 | Disposition: A | Discharge status: Home / Self Care | Payer: Managed Care, Other (non HMO) | Source: Ambulatory Visit | Attending: Cardiology | Admitting: Cardiology

## 2017-03-25 DIAGNOSIS — I4891 Unspecified atrial fibrillation: Secondary | ICD-10-CM

## 2017-03-25 DIAGNOSIS — Z79899 Other long term (current) drug therapy: Secondary | ICD-10-CM | POA: Diagnosis not present

## 2017-03-25 DIAGNOSIS — Z8673 Personal history of transient ischemic attack (TIA), and cerebral infarction without residual deficits: Secondary | ICD-10-CM | POA: Diagnosis not present

## 2017-03-25 DIAGNOSIS — Z87891 Personal history of nicotine dependence: Secondary | ICD-10-CM | POA: Diagnosis not present

## 2017-03-25 DIAGNOSIS — M1990 Unspecified osteoarthritis, unspecified site: Secondary | ICD-10-CM | POA: Diagnosis not present

## 2017-03-25 DIAGNOSIS — I481 Persistent atrial fibrillation: Secondary | ICD-10-CM | POA: Diagnosis not present

## 2017-03-25 DIAGNOSIS — I340 Nonrheumatic mitral (valve) insufficiency: Secondary | ICD-10-CM | POA: Diagnosis not present

## 2017-03-25 DIAGNOSIS — I110 Hypertensive heart disease with heart failure: Secondary | ICD-10-CM | POA: Insufficient documentation

## 2017-03-25 DIAGNOSIS — Z7901 Long term (current) use of anticoagulants: Secondary | ICD-10-CM | POA: Diagnosis not present

## 2017-03-25 HISTORY — PX: TEE WITHOUT CARDIOVERSION: SHX5443

## 2017-03-25 HISTORY — PX: CARDIOVERSION: SHX1299

## 2017-03-25 LAB — POCT I-STAT, CHEM 8
BUN: 30 mg/dL — ABNORMAL HIGH (ref 6–20)
CALCIUM ION: 1.12 mmol/L — AB (ref 1.15–1.40)
CREATININE: 1.6 mg/dL — AB (ref 0.44–1.00)
Chloride: 99 mmol/L — ABNORMAL LOW (ref 101–111)
GLUCOSE: 96 mg/dL (ref 65–99)
HCT: 44 % (ref 36.0–46.0)
HEMOGLOBIN: 15 g/dL (ref 12.0–15.0)
POTASSIUM: 3.1 mmol/L — AB (ref 3.5–5.1)
Sodium: 145 mmol/L (ref 135–145)
TCO2: 34 mmol/L (ref 0–100)

## 2017-03-25 SURGERY — CARDIOVERSION
Anesthesia: General

## 2017-03-25 MED ORDER — SODIUM CHLORIDE 0.9 % IV SOLN
INTRAVENOUS | Status: DC
  Administered 2017-03-25: 12:00:00 via INTRAVENOUS

## 2017-03-25 MED ORDER — SODIUM CHLORIDE 0.9% FLUSH: 3.0000 mL | Freq: Two times a day (BID) | INTRAVENOUS | Status: DC

## 2017-03-25 MED ORDER — PROPOFOL 500 MG/50ML IV EMUL
INTRAVENOUS | Status: DC | PRN
  Administered 2017-03-25: 25 ug/kg/min via INTRAVENOUS

## 2017-03-25 MED ORDER — LACTATED RINGERS IV SOLN
INTRAVENOUS | Status: DC | PRN
  Administered 2017-03-25: 13:00:00 via INTRAVENOUS

## 2017-03-25 MED ORDER — BUTAMBEN-TETRACAINE-BENZOCAINE 2-2-14 % EX AERO
INHALATION_SPRAY | CUTANEOUS | Status: DC | PRN
Start: 2017-03-25 — End: 2017-03-25
  Administered 2017-03-25: 2 via TOPICAL

## 2017-03-25 MED ORDER — PROPOFOL 10 MG/ML IV BOLUS
INTRAVENOUS | Status: DC | PRN
  Administered 2017-03-25: 10 mg via INTRAVENOUS

## 2017-03-25 MED ORDER — POTASSIUM CHLORIDE CRYS ER 20 MEQ PO TBCR
40.0000 meq | EXTENDED_RELEASE_TABLET | Freq: Once | ORAL | Status: AC
  Administered 2017-03-25: 40 meq via ORAL
  Filled 2017-03-25: qty 2

## 2017-03-25 MED ORDER — SODIUM CHLORIDE 0.9% FLUSH: 3.0000 mL | INTRAVENOUS | Status: DC | PRN

## 2017-03-25 MED ORDER — SODIUM CHLORIDE 0.9 % IV SOLN: 250.0000 mL | INTRAVENOUS | Status: DC

## 2017-03-25 NOTE — H&P (Signed)
Office Visit   03/19/2017 [facility]  [name], MD  Cardiology   Essential hypertension +3 more  Dx   Follow-up ; Referred by Biagio Borg, MD  Reason for Visit   Additional Documentation   Vitals:   BP 127/86   Pulse  112   Ht 5\' 5"  (1.651 m)   Wt 272 lb 12.8 oz (123.7 kg)   BMI 45.40 kg/m   BSA 2.38 m      More Vitals   Flowsheets:   Custom Formula Data,   MEWS Score,   Anthropometrics     Encounter Info:   Billing Info,   History,   Allergies,   Detailed Report     All Notes   Procedures by [name], MD at 03/23/2017 10:12 AM   Author: [name], MD Author Type: Physician Filed: 03/23/2017 10:12 AM  Note Status: Signed Cosign: Cosign Not Required Encounter Date: 03/19/2017  Editor: Caren Griffins B. Roberts        Scan on 03/23/2017 10:12 AM by Elzie Rings. Mancel Bale : [facility]  Addendum Note by Zebedee Iba, CMA at 03/19/2017 3:40 PM   Author: Zebedee Iba, CMA Author Type: Certified Medical Assistant Filed: 03/20/2017 2:13 PM  Note Status: Signed Cosign: Cosign Not Required Encounter Date: 03/19/2017  Editor: Zebedee Iba, CMA (Certified Medical Assistant)    Addended by: Zebedee Iba on: 03/20/2017 02:13 PM   Modules accepted: Orders     Progress Notes by [name], MD at 03/19/2017 3:40 PM   Author: [name], MD Author Type: Physician Filed: 03/19/2017 5:16 PM  Note Status: Signed Cosign: Cosign Not Required Encounter Date: 03/19/2017  Editor: [name], MD (Physician)         HPI: Follow-up atrial fibrillation. Patient admitted with new-onset atrial fibrillation November 2017. Echo showed ejection fraction 20-25%, biatrial enlargement and mild mitral regurgitation. Cardiac catheterization November 2017 showed no obstructive coronary disease. Patient underwent TEE guided cardioversion 10/31/16. Atrial fibrillation recurred and she was seen by Dr. Rayann Heman and placed on sotalol. Repeat DCCV  11/03/16.Patient seen in follow-up and noted to be in atrial flutter. She underwent repeat cardioversion on 11/18/2016. Patient was then found to have an acute nonhemorrhagic left superior cerebellar infarct. CTA showed occlusion of the left superior cerebellar artery. She was bradycardic and sotalol discontinued. DOAC DCed and pt placed on coumadin. She subsequently had repeat echocardiogram December 29 showed ejection fraction 20-25%. Noted to have recurrent atrial fibrillation in follow-up office visit with atrial fibrillation clinic January 8. It was felt that she should continue on Coumadin with therapeutic INR for at least 4 weeks and then cardioversion could be considered. Dr. Lovena Le saw patient during most recent admission and felt she would likely require amiodarone to maintain sinus rhythm which was added at last ov. Since last seen, patient denies dyspnea, chest pain, palpitations or syncope. She has bilateral lower extremity edema. Note she discontinued her amiodarone approximately 4 weeks ago and she thought it may be causing her edema.        Current Outpatient Prescriptions  Medication Sig Dispense Refill  . acetaminophen (TYLENOL) 325 MG tablet Take 650 mg by mouth every 6 (six) hours as needed.    Marland Kitchen amiodarone (PACERONE) 200 MG tablet TAKE ONE TABLET TWICE DAILY X 4 WEEKS THEN DECREASE TO ONE TABLET DAILY 120 tablet 3  . diclofenac sodium (VOLTAREN) 1 % GEL Apply 4 g topically 4 (four) times daily  as needed. 400 g 11  . furosemide (LASIX) 40 MG tablet Take 1.5 tablets (60 mg total) by mouth 2 (two) times daily. 270 tablet 3  . KLOR-CON M20 20 MEQ tablet TAKE 1 TABLET (20 MEQ TOTAL) BY MOUTH DAILY. 30 tablet 10  . losartan (COZAAR) 25 MG tablet TAKE 1 TABLET (25 MG TOTAL) BY MOUTH DAILY. 30 tablet 6  . metoprolol (LOPRESSOR) 50 MG tablet Take 1 tablet (50 mg total) by mouth 2 (two) times daily. 180 tablet 3  . rosuvastatin (CRESTOR) 10 MG tablet Take 1 tablet (10 mg total) by mouth  daily. 90 tablet 3  . warfarin (COUMADIN) 7.5 MG tablet Take 1/2 to 1 tablet daily OR as directed by coumadin clinic 60 tablet 1   No current facility-administered medications for this visit.          Past Medical History:  Diagnosis Date  . Arthritis   . CAD in native artery, s/p cardiac cath with non obstructive CAD 10/24/2016  . CHF (congestive heart failure) ([place])   . DIVERTICULITIS, HX OF 07/25/2007  . DIVERTICULOSIS, COLON 07/22/2007  . Dyspnea   . Edema, peripheral    a. chronic BLE edema, R>L. Prior trauma from dog attack and accident.  Marland Kitchen HYPERLIPIDEMIA 02/03/2008  . Hypersomnia    declines w/u  . Hypertension   . MENOPAUSAL DISORDER 01/09/2011  . Morbid obesity ([place]) 07/22/2007  . NICM (nonischemic cardiomyopathy) ([place]) 10/24/2016  . Raynaud's syndrome 07/22/2007  . THYROID NODULE, RIGHT 01/04/2010  . VITAMIN D DEFICIENCY 01/09/2011   Qualifier: Diagnosis of  By: [name], [name]          Past Surgical History:  Procedure Laterality Date  . CARDIAC CATHETERIZATION N/A 10/23/2016   Procedure: Left Heart Cath and Coronary Angiography;  Surgeon: [name], MD;  Location: [place] [facility] LAB;  Service: Cardiovascular;  Laterality: N/A;  . CARDIOVERSION N/A 10/31/2016   Procedure: CARDIOVERSION;  Surgeon: [name], MD;  Location: [facility] ENDOSCOPY;  Service: Cardiovascular;  Laterality: N/A;  . CARDIOVERSION N/A 11/03/2016   Procedure: CARDIOVERSION;  Surgeon: [name], MD;  Location: [facility] ENDOSCOPY;  Service: Cardiovascular;  Laterality: N/A;  . CARDIOVERSION N/A 11/18/2016   Procedure: CARDIOVERSION;  Surgeon: [name], MD;  Location: [facility] ENDOSCOPY;  Service: Cardiovascular;  Laterality: N/A;  . CHOLECYSTECTOMY    . COLONOSCOPY    . PARTIAL HYSTERECTOMY     1 OVARY LEFT  . POLYPECTOMY    . TEE WITHOUT CARDIOVERSION N/A 10/31/2016   Procedure: TRANSESOPHAGEAL ECHOCARDIOGRAM (TEE);  Surgeon: [name], MD;  Location: [facility]  ENDOSCOPY;  Service: Cardiovascular;  Laterality: N/A;    Social History        Social History  . Marital status: Single    Spouse name: N/A  . Number of children: 1  . Years of education: N/A       Occupational History  . Printing           Social History Main Topics  . Smoking status: Former Smoker    Packs/day: 0.50    Types: Cigarettes    Quit date: 10/23/2016  . Smokeless tobacco: Never Used     Comment: Smoked for 50 years  . Alcohol use 2.4 oz/week    4 Cans of beer per week     Comment: Intermittent  . Drug use: No  . Sexual activity: Not on file       Other Topics Concern  . Not on file  Social History Narrative  . No narrative on file          Family History  Problem Relation Age of Onset  . Asthma Mother   . Diabetes Father   . Heart disease Father     Died of presumed heart attack - 91s  . Lung disease Sister   . Heart disease Sister     Twin sister has heart issue, unclear what kind  . Thyroid disease Neg Hx     ROS: no fevers or chills, productive cough, hemoptysis, dysphasia, odynophagia, melena, hematochezia, dysuria, hematuria, rash, seizure activity, orthopnea, PND, claudication. Remaining systems are negative.  Physical Exam: Well-developed obese in no acute distress.  Skin is warm and dry.  HEENT is normal.  Neck is supple.  Chest is clear to auscultation with normal expansion. No wheeze. Cardiovascular exam is irregular and tachycardic Abdominal exam nontender or distended. No masses palpated. Extremities show 2+ edema. neuro grossly intact  ECG- Atrial fibrillation with rapid ventricular response, cannot rule out prior anterior infarct, nonspecific ST changes. personally reviewed  A/P  1 Persistent atrial fibrillation-patient remains in atrial fibrillation and her rate is elevated. She discontinued amiodarone on her own previously because she thought it was causing pedal edema. I  think this is unlikely. I have asked her to resume at 200 mg twice a day for 2 weeks. She will then reduce to 200 mg daily thereafter. Continue Coumadin. Check INR today. If therapeutic we will arrange a TEE guided cardioversion early next week. Hopefully amiodarone will help maintain sinus rhythm. Continue metoprolol.  2 cardiomyopathy-this is felt to be tachycardia mediated. Continue Toprol and ARB. We will plan to repeat echocardiogram 3 months after sinus is reestablished to see if LV function has improved.  3 acute on chronic systolic congestive heart failure-patient is mildly volume overloaded but has significant peripheral edema. Continue present dose of Lasix. Check potassium and renal function. Hopefully reestablishing sinus rhythm will improve her heart failure symptoms.  4 hypertension-blood pressure controlled. Continue present medications.  5 hyperlipidemia-continue statin.  6 tobacco abuse-patient has resumed smoking. I counseled on discontinuing.  Addendum 1.5; will adjust coumadin; recheck INR Mon and if > 2, TEE DCCV Wed.  Kirk Ruths, MD       For TEE/DCCV; recent INR-3.6; K 3.1; will give 40 kdur. Kirk Ruths

## 2017-03-25 NOTE — Anesthesia Procedure Notes (Signed)
Procedure Name: MAC Performed by: Lowella Dell Pre-anesthesia Checklist: Patient identified, Emergency Drugs available, Suction available, Patient being monitored and Timeout performed Oxygen Delivery Method: Nasal cannula Intubation Type: IV induction Placement Confirmation: positive ETCO2 Dental Injury: Teeth and Oropharynx as per pre-operative assessment

## 2017-03-25 NOTE — CV Procedure (Signed)
    Transesophageal Echocardiogram Note  Patricia Wagner [id number] June 06, 1951  Procedure: Transesophageal Echocardiogram Indications: atrial fibrillation  Procedure Details Consent: Obtained Time Out: Verified patient identification, verified procedure, site/side was marked, verified correct patient position, special equipment/implants available, Radiology Safety Procedures followed,  medications/allergies/relevent history reviewed, required imaging and test results available.  Performed  Medications:  Pt sedated by anesthesia with diprovan 100 mg IV total.  Severe LV and RV dysfunction; mild to moderate MR; moderate TR. No LAA thrombus.  Pt subsequently had DCCV with 120, 150, 200 and 200 Js; transient sinus rhythm with last 3 attempts for 1-3 beats but atrial fibrillation recurred; continue coumadin and amiodarone for now.  Note pt sensitive to diprovan with transient hypoxia into 60s during procedure.    Complications: No apparent complications   Kirk Ruths, MD

## 2017-03-25 NOTE — Anesthesia Preprocedure Evaluation (Signed)
Anesthesia Evaluation  Patient identified by MRN, date of birth, ID band Patient awake    Reviewed: Allergy & Precautions, NPO status , Patient's Chart, lab work & pertinent test results, reviewed documented beta blocker date and time   History of Anesthesia Complications Negative for: history of anesthetic complications  Airway Mallampati: II  TM Distance: >3 FB Neck ROM: Full    Dental  (+) Teeth Intact   Pulmonary shortness of breath, former smoker,    breath sounds clear to auscultation       Cardiovascular hypertension, + CAD, + Peripheral Vascular Disease and +CHF  + dysrhythmias Atrial Fibrillation  Rhythm:Irregular     Neuro/Psych CVA, No Residual Symptoms    GI/Hepatic negative GI ROS, Neg liver ROS,   Endo/Other    Renal/GU Renal InsufficiencyRenal disease     Musculoskeletal  (+) Arthritis ,   Abdominal   Peds  Hematology   Anesthesia Other Findings   Reproductive/Obstetrics                             Anesthesia Physical Anesthesia Plan  ASA: III  Anesthesia Plan: MAC   Post-op Pain Management:    Induction: Intravenous  Airway Management Planned: Natural Airway, Nasal Cannula and Simple Face Mask  Additional Equipment: None  Intra-op Plan:   Post-operative Plan:   Informed Consent: I have reviewed the patients History and Physical, chart, labs and discussed the procedure including the risks, benefits and alternatives for the proposed anesthesia with the patient or authorized representative who has indicated his/her understanding and acceptance.   Dental advisory given  Plan Discussed with: CRNA and Surgeon  Anesthesia Plan Comments:         Anesthesia Quick Evaluation

## 2017-03-25 NOTE — Discharge Instructions (Signed)
TEE ° °YOU HAD AN CARDIAC PROCEDURE TODAY: Refer to the procedure report and other information in the discharge instructions given to you for any specific questions about what was found during the examination. If this information does not answer your questions, please call [facility] office at [phone] to clarify.  ° °DIET: Your first meal following the procedure should be a light meal and then it is ok to progress to your normal diet. A half-sandwich or bowl of soup is an example of a good first meal. Heavy or fried foods are harder to digest and may make you feel nauseous or bloated. Drink plenty of fluids but you should avoid alcoholic beverages for 24 hours. If you had a esophageal dilation, please see attached instructions for diet.  ° °ACTIVITY: Your care partner should take you home directly after the procedure. You should plan to take it easy, moving slowly for the rest of the day. You can resume normal activity the day after the procedure however YOU SHOULD NOT DRIVE, use power tools, machinery or perform tasks that involve climbing or major physical exertion for 24 hours (because of the sedation medicines used during the test).  ° °SYMPTOMS TO REPORT IMMEDIATELY: °A cardiologist can be reached at any hour. Please call [phone] for any of the following symptoms:  °Vomiting of blood or coffee ground material  °New, significant abdominal pain  °New, significant chest pain or pain under the shoulder blades  °Painful or persistently difficult swallowing  °New shortness of breath  °Black, tarry-looking or red, bloody stools ° °FOLLOW UP:  °Please also call with any specific questions about appointments or follow up tests. ° ° °Electrical Cardioversion, Care After °This sheet gives you information about how to care for yourself after your procedure. Your health care provider may also give you more specific instructions. If you have problems or questions, contact your health care provider. °What can I  expect after the procedure? °After the procedure, it is common to have: °· Some redness on the skin where the shocks were given. °Follow these instructions at home: °· Do not drive for 24 hours if you were given a medicine to help you relax (sedative). °· Take over-the-counter and prescription medicines only as told by your health care provider. °· Ask your health care provider how to check your pulse. Check it often. °· Rest for 48 hours after the procedure or as told by your health care provider. °· Avoid or limit your caffeine use as told by your health care provider. °Contact a health care provider if: °· You feel like your heart is beating too quickly or your pulse is not regular. °· You have a serious muscle cramp that does not go away. °Get help right away if: °· You have discomfort in your chest. °· You are dizzy or you feel faint. °· You have trouble breathing or you are short of breath. °· Your speech is slurred. °· You have trouble moving an arm or leg on one side of your body. °· Your fingers or toes turn cold or blue. °This information is not intended to replace advice given to you by your health care provider. Make sure you discuss any questions you have with your health care provider. °Document Released: 08/31/2013 Document Revised: 06/13/2016 Document Reviewed: 05/16/2016 °Elsevier Interactive Patient Education © 2017 [employer] ° °

## 2017-03-25 NOTE — Anesthesia Postprocedure Evaluation (Addendum)
Anesthesia Post Note  Patient: Patricia Wagner  Procedure(s) Performed: Procedure(s) (LRB): CARDIOVERSION (N/A) TRANSESOPHAGEAL ECHOCARDIOGRAM (TEE) (N/A)  Patient location during evaluation: Endoscopy Anesthesia Type: General Level of consciousness: awake and alert Pain management: pain level controlled Vital Signs Assessment: post-procedure vital signs reviewed and stable Respiratory status: spontaneous breathing, nonlabored ventilation, respiratory function stable and patient connected to nasal cannula oxygen Cardiovascular status: blood pressure returned to baseline and stable Postop Assessment: no signs of nausea or vomiting Anesthetic complications: no       Last Vitals:  Vitals:   03/25/17 1440 03/25/17 1450  BP: 128/83   Pulse: 73 74  Resp:  19  Temp:      Last Pain:  Vitals:   03/25/17 1434  TempSrc: Oral                 Kalle Bernath

## 2017-03-25 NOTE — Transfer of Care (Signed)
Immediate Anesthesia Transfer of Care Note  Patient: Patricia Wagner  Procedure(s) Performed: Procedure(s): CARDIOVERSION (N/A) TRANSESOPHAGEAL ECHOCARDIOGRAM (TEE) (N/A)  Patient Location: PACU  Anesthesia Type:MAC  Level of Consciousness: awake, alert , oriented and patient cooperative  Airway & Oxygen Therapy: Patient Spontanous Breathing and Patient connected to nasal cannula oxygen  Post-op Assessment: Report given to RN, Post -op Vital signs reviewed and stable, Patient moving all extremities X 4 and Patient able to stick tongue midline  Post vital signs: Reviewed and stable  Last Vitals:  Vitals:   03/25/17 1210 03/25/17 1434  BP: (!) 114/100 132/83  Pulse:  66  Resp: (!) 23 18  Temp: 36.6 C     Last Pain:  Vitals:   03/25/17 1434  TempSrc: Oral         Complications: No apparent anesthesia complications.

## 2017-03-26 ENCOUNTER — Ambulatory Visit (INDEPENDENT_AMBULATORY_CARE_PROVIDER_SITE_OTHER): Payer: Managed Care, Other (non HMO) | Admitting: Cardiology

## 2017-03-26 ENCOUNTER — Encounter: Payer: Self-pay | Admitting: Cardiology

## 2017-03-26 VITALS — BP 124/80 | HR 80 | Ht 65.0 in | Wt 279.0 lb

## 2017-03-26 DIAGNOSIS — I481 Persistent atrial fibrillation: Secondary | ICD-10-CM

## 2017-03-26 DIAGNOSIS — I429 Cardiomyopathy, unspecified: Secondary | ICD-10-CM

## 2017-03-26 DIAGNOSIS — I428 Other cardiomyopathies: Secondary | ICD-10-CM | POA: Diagnosis not present

## 2017-03-26 DIAGNOSIS — I5022 Chronic systolic (congestive) heart failure: Secondary | ICD-10-CM | POA: Diagnosis not present

## 2017-03-26 DIAGNOSIS — I4819 Other persistent atrial fibrillation: Secondary | ICD-10-CM

## 2017-03-26 DIAGNOSIS — I10 Essential (primary) hypertension: Secondary | ICD-10-CM | POA: Diagnosis not present

## 2017-03-26 MED ORDER — POTASSIUM CHLORIDE CRYS ER 20 MEQ PO TBCR: 20.0000 meq | EXTENDED_RELEASE_TABLET | Freq: Two times a day (BID) | ORAL | 3 refills | Status: DC

## 2017-03-26 MED ORDER — SACUBITRIL-VALSARTAN 49-51 MG PO TABS: 1.0000 | ORAL_TABLET | Freq: Two times a day (BID) | ORAL | Status: DC

## 2017-03-26 MED ORDER — CARVEDILOL 6.25 MG PO TABS: 6.2500 mg | ORAL_TABLET | Freq: Two times a day (BID) | ORAL | 3 refills | Status: DC

## 2017-03-26 NOTE — Progress Notes (Signed)
HPI: Follow-up atrial fibrillation. Patient admitted with new-onset atrial fibrillation November 2017. Echo showed ejection fraction 20-25%, biatrial enlargement and mild mitral regurgitation. Cardiac catheterization November 2017 showed no obstructive coronary disease. Patient underwent TEE guided cardioversion 10/31/16. Atrial fibrillation recurred and she was seen by Dr. Rayann Heman and placed on sotalol. Repeat DCCV 11/03/16.Patient seen in follow-up and noted to be in atrial flutter. She underwent repeat cardioversion on 11/18/2016. Patient was then found to have an acute nonhemorrhagic left superior cerebellar infarct. CTA showed occlusion of the left superior cerebellar artery. She was bradycardic and sotalol discontinued. DOAC DCed and pt placed on coumadin. Noted to have recurrent atrial fibrillation in follow-up office visit with atrial fibrillation clinic January 8. It was felt that she should continue on Coumadin with therapeutic INR for at least 4 weeks and then cardioversion could be considered. Dr. Lovena Le saw patient and felt she would likely require amiodarone to maintain sinus rhythm which was added. Patient had TEE guided cardioversion on May 2. She was found to have severe LV dysfunction, severe RV dysfunction, mild to moderate mitral regurgitation and moderate tricuspid regurgitation. She underwent cardioversion 4 times. The last 3 times showed sinus rhythm for 2-3 beats and then atrial fibrillation recurred. Since last seen, patient has some dyspnea on exertion but no orthopnea or PND. She continues with pedal edema. No chest pain, palpitations or syncope.   Current Outpatient Prescriptions  Medication Sig Dispense Refill  . acetaminophen (TYLENOL) 325 MG tablet Take 650 mg by mouth every 6 (six) hours as needed.    Marland Kitchen amiodarone (PACERONE) 200 MG tablet TAKE ONE TABLET TWICE DAILY X 2 WEEKS THEN DECREASE TO ONE TABLET DAILY 120 tablet 3  . diclofenac sodium (VOLTAREN) 1 % GEL Apply 4  g topically 4 (four) times daily as needed. 400 g 11  . furosemide (LASIX) 40 MG tablet Take 1.5 tablets (60 mg total) by mouth 2 (two) times daily. 270 tablet 3  . KLOR-CON M20 20 MEQ tablet TAKE 1 TABLET (20 MEQ TOTAL) BY MOUTH DAILY. 30 tablet 10  . losartan (COZAAR) 25 MG tablet TAKE 1 TABLET (25 MG TOTAL) BY MOUTH DAILY. 30 tablet 6  . metoprolol (LOPRESSOR) 50 MG tablet Take 1 tablet (50 mg total) by mouth 2 (two) times daily. 180 tablet 3  . rosuvastatin (CRESTOR) 10 MG tablet Take 1 tablet (10 mg total) by mouth daily. 90 tablet 3  . warfarin (COUMADIN) 7.5 MG tablet Take 1/2 to 1 tablet daily OR as directed by coumadin clinic 60 tablet 1   No current facility-administered medications for this visit.      Past Medical History:  Diagnosis Date  . Arthritis   . CAD in native artery, s/p cardiac cath with non obstructive CAD 10/24/2016  . CHF (congestive heart failure) ([place])   . DIVERTICULITIS, HX OF 07/25/2007  . DIVERTICULOSIS, COLON 07/22/2007  . Dyspnea   . Edema, peripheral    a. chronic BLE edema, R>L. Prior trauma from dog attack and accident.  Marland Kitchen HYPERLIPIDEMIA 02/03/2008  . Hypersomnia    declines w/u  . Hypertension   . MENOPAUSAL DISORDER 01/09/2011  . Morbid obesity ([place]) 07/22/2007  . NICM (nonischemic cardiomyopathy) ([place]) 10/24/2016  . Raynaud's syndrome 07/22/2007  . THYROID NODULE, RIGHT 01/04/2010  . VITAMIN D DEFICIENCY 01/09/2011   Qualifier: Diagnosis of  By: [name], [name]     Past Surgical History:  Procedure Laterality Date  . CARDIAC CATHETERIZATION N/A 10/23/2016  Procedure: Left Heart Cath and Coronary Angiography;  Surgeon: [name], MD;  Location: [facility];  Service: Cardiovascular;  Laterality: N/A;  . CARDIOVERSION N/A 10/31/2016   Procedure: CARDIOVERSION;  Surgeon: [name], MD;  Location: [facility] ENDOSCOPY;  Service: Cardiovascular;  Laterality: N/A;  . CARDIOVERSION N/A 11/03/2016   Procedure: CARDIOVERSION;  Surgeon: [name], MD;  Location: [facility] ENDOSCOPY;  Service: Cardiovascular;  Laterality: N/A;  . CARDIOVERSION N/A 11/18/2016   Procedure: CARDIOVERSION;  Surgeon: [name], MD;  Location: [facility] ENDOSCOPY;  Service: Cardiovascular;  Laterality: N/A;  . CARDIOVERSION N/A 03/25/2017   Procedure: CARDIOVERSION;  Surgeon: [name], MD;  Location: [facility] ENDOSCOPY;  Service: Cardiovascular;  Laterality: N/A;  . CHOLECYSTECTOMY    . COLONOSCOPY    . PARTIAL HYSTERECTOMY     1 OVARY LEFT  . POLYPECTOMY    . TEE WITHOUT CARDIOVERSION N/A 10/31/2016   Procedure: TRANSESOPHAGEAL ECHOCARDIOGRAM (TEE);  Surgeon: [name], MD;  Location: [facility] ENDOSCOPY;  Service: Cardiovascular;  Laterality: N/A;  . TEE WITHOUT CARDIOVERSION N/A 03/25/2017   Procedure: TRANSESOPHAGEAL ECHOCARDIOGRAM (TEE);  Surgeon: [name], MD;  Location: [facility] ENDOSCOPY;  Service: Cardiovascular;  Laterality: N/A;    Social History   Social History  . Marital status: Single    Spouse name: N/A  . Number of children: 1  . Years of education: N/A   Occupational History  . Printing     Social History Main Topics  . Smoking status: Former Smoker    Packs/day: 0.50    Types: Cigarettes    Quit date: 10/23/2016  . Smokeless tobacco: Never Used     Comment: Smoked for 50 years  . Alcohol use 2.4 oz/week    4 Cans of beer per week     Comment: Intermittent  . Drug use: No  . Sexual activity: Not on file   Other Topics Concern  . Not on file   Social History Narrative  . No narrative on file    Family History  Problem Relation Age of Onset  . Asthma Mother   . Diabetes Father   . Heart disease Father     Died of presumed heart attack - 8s  . Lung disease Sister   . Heart disease Sister     Twin sister has heart issue, unclear what kind  . Thyroid disease Neg Hx     ROS: no fevers or chills, productive cough, hemoptysis, dysphasia, odynophagia, melena, hematochezia, dysuria, hematuria, rash, seizure activity,  orthopnea, PND, claudication. Remaining systems are negative.  Physical Exam: Well-developed obese in no acute distress.  Skin is warm and dry.  HEENT is normal.  Neck is supple. No bruits Chest is clear to auscultation with normal expansion.  Cardiovascular exam is irregular Abdominal exam nontender or distended. No masses palpated. Extremities show 2+edema  A/P  1 chronic systolic congestive heart failure-patient continues with pedal edema. She has some dyspnea on exertion. Her transesophageal echocardiogram yesterday showed severe LV and RV dysfunction, mild to moderate mitral regurgitation and moderate tricuspid regurgitation. We will continue with present dose of Lasix. She will need fluid restriction and low sodium diet.  2 cardiomyopathy-etiology unclear. Previous catheterization revealed no coronary disease. Likely not alcohol related that she has discontinued this and was not a heavy drinker previously. I will discontinue Cozaar and add entresto 49/51 BID. Discontinue metoprolol and treated with carvedilol 6.25 mg twice a day. Check potassium and renal function in 1 week.  Refer to CHF clinic for medication titration and further therapy.  3 persistent atrial fibrillation-we attempted repeat cardioversion yesterday. She held sinus for 2-3 beats and then atrial fibrillation recurred. It is not clear to me that she had enough amiodarone in her system and she had held this for one month prior to resuming it one week prior to cardioversion. We will continue amiodarone and continue Coumadin. I will ask her to be seen in the atrial fibrillation clinic. Question consider ablation versus continuing amiodarone for 30 days with a repeat attempt at cardioversion. I think her atrial fibrillation and tachycardia likely contributed to her cardiomyopathy.  4 hypertension-blood pressure controlled.  5 hyperlipidemia-continue statin.  6 tobacco abuse-patient has discontinued by report.  Kirk Ruths, MD

## 2017-03-26 NOTE — Patient Instructions (Signed)
Medication Instructions:   STOP LOSARTAN X 2 DAYS  AFTER 2 DAYS START ENTRESTO 49/51 MG TWICE DAILY  INCREASE K-DUR TO 20 MEQ TWICE DAILY  STOP METOPROLOL  START CARVEDILOL 6.25 MG TWICE DAILY   Follow-Up:  REFERRAL TO ATRIAL FIB CLINIC NEXT AVAILABLE  REFERRAL TO ADVANCED HEART FAILURE CLINIC NEXT AVAILABLE  Your physician recommends that you schedule a follow-up appointment in: [place]   If you need a refill on your cardiac medications before your next appointment, please call your pharmacy.

## 2017-03-27 ENCOUNTER — Telehealth: Payer: Self-pay | Admitting: Cardiology

## 2017-03-27 NOTE — Telephone Encounter (Signed)
Pt calling to get last ov note faxed to [facility] for her short term disability to send her check-fax to [phone]

## 2017-03-27 NOTE — Telephone Encounter (Signed)
Spoke with pt, aware paperwork sent to the number provided.

## 2017-04-01 ENCOUNTER — Telehealth: Payer: Self-pay | Admitting: *Deleted

## 2017-04-01 NOTE — Telephone Encounter (Signed)
FMLA paperwork faxed to the number provided.

## 2017-04-06 ENCOUNTER — Ambulatory Visit (INDEPENDENT_AMBULATORY_CARE_PROVIDER_SITE_OTHER): Payer: Managed Care, Other (non HMO) | Admitting: Pharmacist Clinician (PhC)/ Clinical Pharmacy Specialist

## 2017-04-06 DIAGNOSIS — Z7901 Long term (current) use of anticoagulants: Secondary | ICD-10-CM | POA: Diagnosis not present

## 2017-04-06 LAB — POCT INR: INR: 6.8

## 2017-04-06 MED ORDER — SACUBITRIL-VALSARTAN 49-51 MG PO TABS: 1.0000 | ORAL_TABLET | Freq: Two times a day (BID) | ORAL | 5 refills | Status: DC

## 2017-04-07 LAB — PROTIME-INR
INR: 5.9 — ABNORMAL HIGH
Prothrombin Time: 58.1 s — ABNORMAL HIGH (ref 9.0–11.5)

## 2017-04-09 ENCOUNTER — Ambulatory Visit (HOSPITAL_COMMUNITY)
Admission: RE | Admit: 2017-04-09 | Discharge: 2017-04-09 | Disposition: A | Discharge status: Home / Self Care | Payer: Managed Care, Other (non HMO) | Source: Ambulatory Visit | Attending: Nurse Practitioner | Admitting: Nurse Practitioner

## 2017-04-09 ENCOUNTER — Encounter (HOSPITAL_COMMUNITY): Payer: Self-pay | Admitting: Nurse Practitioner

## 2017-04-09 VITALS — BP 118/82 | HR 93 | Ht 65.0 in | Wt 288.0 lb

## 2017-04-09 DIAGNOSIS — Z7901 Long term (current) use of anticoagulants: Secondary | ICD-10-CM | POA: Diagnosis not present

## 2017-04-09 DIAGNOSIS — E785 Hyperlipidemia, unspecified: Secondary | ICD-10-CM | POA: Diagnosis not present

## 2017-04-09 DIAGNOSIS — Z90721 Acquired absence of ovaries, unilateral: Secondary | ICD-10-CM | POA: Insufficient documentation

## 2017-04-09 DIAGNOSIS — Z90711 Acquired absence of uterus with remaining cervical stump: Secondary | ICD-10-CM | POA: Insufficient documentation

## 2017-04-09 DIAGNOSIS — I4891 Unspecified atrial fibrillation: Secondary | ICD-10-CM | POA: Diagnosis not present

## 2017-04-09 DIAGNOSIS — I4819 Other persistent atrial fibrillation: Secondary | ICD-10-CM

## 2017-04-09 DIAGNOSIS — E559 Vitamin D deficiency, unspecified: Secondary | ICD-10-CM | POA: Diagnosis not present

## 2017-04-09 DIAGNOSIS — I509 Heart failure, unspecified: Secondary | ICD-10-CM | POA: Diagnosis not present

## 2017-04-09 DIAGNOSIS — Z87891 Personal history of nicotine dependence: Secondary | ICD-10-CM | POA: Diagnosis not present

## 2017-04-09 DIAGNOSIS — Z8673 Personal history of transient ischemic attack (TIA), and cerebral infarction without residual deficits: Secondary | ICD-10-CM | POA: Insufficient documentation

## 2017-04-09 DIAGNOSIS — I481 Persistent atrial fibrillation: Secondary | ICD-10-CM

## 2017-04-09 DIAGNOSIS — I2510 Atherosclerotic heart disease of native coronary artery without angina pectoris: Secondary | ICD-10-CM | POA: Insufficient documentation

## 2017-04-09 DIAGNOSIS — Z833 Family history of diabetes mellitus: Secondary | ICD-10-CM | POA: Insufficient documentation

## 2017-04-09 DIAGNOSIS — I7300 Raynaud's syndrome without gangrene: Secondary | ICD-10-CM | POA: Insufficient documentation

## 2017-04-09 DIAGNOSIS — I110 Hypertensive heart disease with heart failure: Secondary | ICD-10-CM | POA: Diagnosis not present

## 2017-04-09 DIAGNOSIS — I4892 Unspecified atrial flutter: Secondary | ICD-10-CM | POA: Diagnosis not present

## 2017-04-09 DIAGNOSIS — Z825 Family history of asthma and other chronic lower respiratory diseases: Secondary | ICD-10-CM | POA: Insufficient documentation

## 2017-04-09 DIAGNOSIS — Z9889 Other specified postprocedural states: Secondary | ICD-10-CM | POA: Diagnosis not present

## 2017-04-09 DIAGNOSIS — I429 Cardiomyopathy, unspecified: Secondary | ICD-10-CM | POA: Insufficient documentation

## 2017-04-09 DIAGNOSIS — Z8249 Family history of ischemic heart disease and other diseases of the circulatory system: Secondary | ICD-10-CM | POA: Diagnosis not present

## 2017-04-09 DIAGNOSIS — Z9049 Acquired absence of other specified parts of digestive tract: Secondary | ICD-10-CM | POA: Diagnosis not present

## 2017-04-09 DIAGNOSIS — M4802 Spinal stenosis, cervical region: Secondary | ICD-10-CM | POA: Diagnosis not present

## 2017-04-09 DIAGNOSIS — M1990 Unspecified osteoarthritis, unspecified site: Secondary | ICD-10-CM | POA: Diagnosis not present

## 2017-04-09 DIAGNOSIS — Z888 Allergy status to other drugs, medicaments and biological substances status: Secondary | ICD-10-CM | POA: Diagnosis not present

## 2017-04-09 DIAGNOSIS — Z6841 Body Mass Index (BMI) 40.0 and over, adult: Secondary | ICD-10-CM | POA: Insufficient documentation

## 2017-04-10 ENCOUNTER — Telehealth: Payer: Self-pay | Admitting: Cardiology

## 2017-04-10 NOTE — Telephone Encounter (Signed)
Forward to debra

## 2017-04-10 NOTE — Telephone Encounter (Signed)
New Message  Pt call requesting to speak with RN about dropping off some paperwork for RN to sign. Please call back to discuss

## 2017-04-10 NOTE — Telephone Encounter (Signed)
Spoke with pt, her daughter is dropping off paperwork to be filled out.

## 2017-04-10 NOTE — Progress Notes (Addendum)
Primary Care Physician: Biagio Borg, MD Referring Physician: [facility] f/u Cardiologist: Dr. Pennie Banter EP: Dr. [place] Cellar NYCOLE KAWAHARA is a 66 y.o. female with a h/o  past medical history significant for persistent atrial fibrillation, non ischemic cardiomyopathy, hyperlipidemia, and hypertension.  Because of cardiomyopathy, it was felt that she would require AAD therapy to maintain SR.  She was concerned about cost for Tikosyn and so Sotalol was initiated. She underwent cardioversion on 11/03/16 with restoration of sinus rhythm.  Her fluid status was improved on discharge at 239 lbs.  F/u in afib clinic 12/14 and is found to be in atrial flutter with RVR at 112 bpm. Pt is unaware of heart beat. Fluid status is stable per pt and is no longer having LLE, PND/orthopnea.  F/u afib clinic 12/20. She is still in afib but rate is controlled. BB was increased on last visit. She does not want another cardioversion at this time. She feels well and would like to return to work. She is having diarrhea which is probably the effects of the mag supplement.   F/u 12/21, she asked to be seen today for feeling poorly, more short of breath, still having diarrhea, she thinks increase of metoprolol caused loose stools and stopping magnesium for several days did not help. She continues in rate controlled afib but her weight is up 2 lbs. She states that she is being complaint with all meds and has not missed any sotalol or eliquis. She asked to return to work on last visit but is having difficulty carrying out her activities due to fatigue, and asked to be placed out of work until she feels better. Decision was made to cardiovert.  She returns to afib clinic 1/8. She had a successful cardioversion 12/26 but presented to the ER 12/28 with left eye pain and left sided H/A.She was also found to be bradycardic. Sotalol was stopped.Marland KitchenMRI ordered and showed acute nonhemorrhagic left superior cerebellar infarct. EF continued to show  EF of 20-25 %. Pt states that she had taken DOAC without interruption but it was decided to switch pt to coumadin and lovenox.Pt states today that she finished lovenox shots on Saturday. She has mild weakness in left hand and leg but is undergoing PT and is improving. She was suppose to be on 1 1/2 tabs of lasix  On d/c but has been taking 40 mg a day and fluid status is stable, down 10-12 lbs. It was decided to rate control her for a while. Currently she is not noticing any palpitations.  F/U afib clinic 5/17-since seeing pt last, she was seen by Dr. Lovena Le during a subsequent hospitalization and amiodarone load was started at 200 mg bid with plans for cardioversion after loading x 4-6 weeks. She had TEE guided cardioversion 5/2 which was unsuccessful. Pt did stop amiodarone on her own for a few weeks prior to cardioversion 2/2 she felt it was making her ankles swell. This mayhave played into her inability to restore SR with latest cardioversion. She is being seen today for consideration if repeat cardioversion should be pursued after longer loading on amiodarone vrs ablation.  Today, she denies symptoms of   chest pain,  orthopnea, PND,  dizziness, presyncope, syncope, or neurologic sequela. Chronic fatigue and LLE, exertional dyspnea.The patient is tolerating medications without difficulties and is otherwise without complaint today.   Past Medical History:  Diagnosis Date  . Arthritis   . CAD in native artery, s/p cardiac cath with non obstructive CAD  10/24/2016  . CHF (congestive heart failure) ([place])   . DIVERTICULITIS, HX OF 07/25/2007  . DIVERTICULOSIS, COLON 07/22/2007  . Dyspnea   . Edema, peripheral    a. chronic BLE edema, R>L. Prior trauma from dog attack and accident.  Marland Kitchen HYPERLIPIDEMIA 02/03/2008  . Hypersomnia    declines w/u  . Hypertension   . MENOPAUSAL DISORDER 01/09/2011  . Morbid obesity ([place]) 07/22/2007  . NICM (nonischemic cardiomyopathy) ([place]) 10/24/2016  . Raynaud's syndrome  07/22/2007  . THYROID NODULE, RIGHT 01/04/2010  . VITAMIN D DEFICIENCY 01/09/2011   Qualifier: Diagnosis of  By: [name], [name]    Past Surgical History:  Procedure Laterality Date  . CARDIAC CATHETERIZATION N/A 10/23/2016   Procedure: Left Heart Cath and Coronary Angiography;  Surgeon: [name], MD;  Location: [place] [facility] LAB;  Service: Cardiovascular;  Laterality: N/A;  . CARDIOVERSION N/A 10/31/2016   Procedure: CARDIOVERSION;  Surgeon: [name], MD;  Location: [facility] ENDOSCOPY;  Service: Cardiovascular;  Laterality: N/A;  . CARDIOVERSION N/A 11/03/2016   Procedure: CARDIOVERSION;  Surgeon: [name], MD;  Location: [facility] ENDOSCOPY;  Service: Cardiovascular;  Laterality: N/A;  . CARDIOVERSION N/A 11/18/2016   Procedure: CARDIOVERSION;  Surgeon: [name], MD;  Location: [facility] ENDOSCOPY;  Service: Cardiovascular;  Laterality: N/A;  . CARDIOVERSION N/A 03/25/2017   Procedure: CARDIOVERSION;  Surgeon: [name], MD;  Location: [facility] ENDOSCOPY;  Service: Cardiovascular;  Laterality: N/A;  . CHOLECYSTECTOMY    . COLONOSCOPY    . PARTIAL HYSTERECTOMY     1 OVARY LEFT  . POLYPECTOMY    . TEE WITHOUT CARDIOVERSION N/A 10/31/2016   Procedure: TRANSESOPHAGEAL ECHOCARDIOGRAM (TEE);  Surgeon: [name], MD;  Location: [facility] ENDOSCOPY;  Service: Cardiovascular;  Laterality: N/A;  . TEE WITHOUT CARDIOVERSION N/A 03/25/2017   Procedure: TRANSESOPHAGEAL ECHOCARDIOGRAM (TEE);  Surgeon: [name], MD;  Location: [facility] ENDOSCOPY;  Service: Cardiovascular;  Laterality: N/A;    Current Outpatient Prescriptions  Medication Sig Dispense Refill  . acetaminophen (TYLENOL) 325 MG tablet Take 650 mg by mouth every 6 (six) hours as needed.    Marland Kitchen amiodarone (PACERONE) 200 MG tablet TAKE ONE TABLET TWICE DAILY X 2 WEEKS THEN DECREASE TO ONE TABLET DAILY 120 tablet 3  . carvedilol (COREG) 6.25 MG tablet Take 1 tablet (6.25 mg total) by mouth 2 (two) times daily. 180 tablet 3  . diclofenac  sodium (VOLTAREN) 1 % GEL Apply 4 g topically 4 (four) times daily as needed. 400 g 11  . furosemide (LASIX) 40 MG tablet Take 1.5 tablets (60 mg total) by mouth 2 (two) times daily. 270 tablet 3  . potassium chloride SA (KLOR-CON M20) 20 MEQ tablet Take 1 tablet (20 mEq total) by mouth 2 (two) times daily. 180 tablet 3  . rosuvastatin (CRESTOR) 10 MG tablet Take 1 tablet (10 mg total) by mouth daily. 90 tablet 3  . sacubitril-valsartan (ENTRESTO) 49-51 MG Take 1 tablet by mouth 2 (two) times daily. 60 tablet 5  . warfarin (COUMADIN) 7.5 MG tablet Take 1/2 to 1 tablet daily OR as directed by coumadin clinic 60 tablet 1   Current Facility-Administered Medications  Medication Dose Route Frequency Provider Last Rate Last Dose  . sacubitril-valsartan (ENTRESTO) 49-51 mg per tablet  1 tablet Oral BID [name], MD        Allergies  Allergen Reactions  . Ace Inhibitors Palpitations    Social History   Social History  . Marital status: Single  Spouse name: N/A  . Number of children: 1  . Years of education: N/A   Occupational History  . Printing     Social History Main Topics  . Smoking status: Former Smoker    Packs/day: 0.50    Types: Cigarettes    Quit date: 10/23/2016  . Smokeless tobacco: Never Used     Comment: Smoked for 50 years  . Alcohol use 2.4 oz/week    4 Cans of beer per week     Comment: Intermittent  . Drug use: No  . Sexual activity: Not on file   Other Topics Concern  . Not on file   Social History Narrative  . No narrative on file    Family History  Problem Relation Age of Onset  . Asthma Mother   . Diabetes Father   . Heart disease Father        Died of presumed heart attack - 50s  . Lung disease Sister   . Heart disease Sister        Twin sister has heart issue, unclear what kind  . Thyroid disease Neg Hx     ROS- All systems are reviewed and negative except as per the HPI above  Physical Exam: Vitals:   04/09/17 1328  BP:  118/82  Pulse: 93  Weight: 288 lb (130.6 kg)  Height: 5\' 5"  (1.651 m)   Wt Readings from Last 3 Encounters:  04/09/17 288 lb (130.6 kg)  03/26/17 279 lb (126.6 kg)  03/25/17 272 lb (123.4 kg)    Labs: Lab Results  Component Value Date   NA 145 03/25/2017   K 3.1 (L) 03/25/2017   CL 99 (L) 03/25/2017   CO2 33 (H) 01/20/2017   GLUCOSE 96 03/25/2017   BUN 30 (H) 03/25/2017   CREATININE 1.60 (H) 03/25/2017   CALCIUM 8.9 01/20/2017   MG 1.8 12/19/2016   Lab Results  Component Value Date   INR 5.9 (H) 04/06/2017   Lab Results  Component Value Date   CHOL 144 11/21/2016   HDL 43 11/21/2016   LDLCALC 89 11/21/2016   TRIG 60 11/21/2016     GEN- The patient is well appearing, alert and oriented x 3 today.   Head- normocephalic, atraumatic Eyes-  Sclera clear, conjunctiva pink Ears- hearing intact Oropharynx- clear Neck- supple, no JVP Lymph- no cervical lymphadenopathy Lungs- Clear to ausculation bilaterally, normal work of breathing Heart-  irregular rate and rhythm, no murmurs, rubs or gallops, PMI not laterally displaced GI- soft, NT, ND, + BS Extremities- no clubbing, cyanosis, or  2+ LLE MS- no significant deformity or atrophy Skin- no rash or lesion Psych- euthymic mood, full affect Neuro- strength and sensation are intact  EKG-afib at 102 bpm, qrs int 104 ms, qtc 463 ms Epic records reviewed-Study Conclusions  - Left ventricle: The cavity size was mildly to moderately dilated.   Wall thickness was increased in a pattern of mild LVH. Systolic   function was severely reduced. The estimated ejection fraction   was in the range of 20% to 25%. Diffuse hypokinesis.   Indeterminant diastolic function (atrial fibrillation). - Aortic valve: There was no stenosis. - Mitral valve: Mildly calcified annulus. There was trivial   regurgitation. - Left atrium: The atrium was severely dilated. 51 mm - Right ventricle: The cavity size was normal. Systolic function   was  moderately reduced. - Right atrium: The atrium was moderately dilated. - Pulmonary arteries: No complete TR doppler jet so unable to  estimate PA systolic pressure. - Systemic veins: IVC measured 1.9 cm with < 50% respirophasic   variation, suggesting RA pressure 8 mmHg. - Pericardium, extracardiac: A trivial pericardial effusion was   identified posterior to the heart.  Impressions:  - The patient was in atrial fibrillation. Mild to moderate LV   dilation with mild LV hypertrophy. EF 20-25%, diffuse   hypokinesis. Normal RV size with moderately decreased systolic   function. Biatrial enlargement. Left atrium 51 mm  MRI-FINDINGS: Brain: An acute/subacute nonhemorrhagic infarct is evident within the left superior cerebellum. No acute supratentorial infarct is present. Subtle T2 changes are associated with the infarct suggesting a more acute timeframe.  A remote cortical infarct is present in the right occipital lobe medially. No other focal infarct is present. Mild periventricular T2 changes are within normal limits for age. The ventricles are normal size. No significant extra-axial fluid collection is present.  Vascular: Flow is present in the major intracranial arteries.  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 The skullbase is otherwise within normal limits. The sella turcica is enlarged. No mass lesion is present. Midline sagittal structures are otherwise unremarkable. Degenerative changes are noted in the upper cervical spine with moderate cervical stenosis suggested at C4-5.  Sinuses/Orbits: The paranasal sinuses and the mastoid air cells are clear. The globes and orbits are within normal limits.  IMPRESSION: 1. Acute nonhemorrhagic left superior cerebellar infarct. 2. Normal flow is present within the major arteries of the circle of Willis. 3. Remote right occipital lobe infarct.     Assessment and Plan: 1.  Persistent symptomatic Afib/flutter, failing sotalol and multiple cardioversions Recently loaded on amiodarone but pt held amio for a few weeks prior to latest cardioversion, which was not successful I don't think pt is the best candidate for an ablation with atrial size of 51 mm, so I think it is reasonable to try loading amiodarone for another month and then try repeat cardioversion. I will further discuss with Dr. Rayann Heman to hear his opinion if he agrees with above plan Weekly INR's for now   2. HTN stable  3. Acute on chronic HF, by latest TEE EF 15% Probable TMC Fluid status improved per pt Continue lasix 60 mg bid Avoid salt She is scheduled to be seen by [name] in HF clinic 6/13  Addendum - 5/21- discussed with Dr. Rayann Heman and he is willing to try ablation. Will request consultation with him to discuss further with pt.   F/u in afib clinic as needed   Butch Penny C. Shaarav Ripple, [facility] [address] [place], [place] [address] [phone]

## 2017-04-13 ENCOUNTER — Ambulatory Visit: Payer: Managed Care, Other (non HMO) | Admitting: Cardiology

## 2017-04-13 ENCOUNTER — Telehealth: Payer: Self-pay | Admitting: Cardiology

## 2017-04-13 ENCOUNTER — Telehealth: Payer: Self-pay | Admitting: *Deleted

## 2017-04-13 NOTE — Telephone Encounter (Signed)
Returned call to patient. Advised that paperwork was submitted via fax this AM. She states [employer] will be faxing paperwork that needs to be completed. This should be faxed to [phone]. Routed to [place], Therapist, sports

## 2017-04-13 NOTE — Telephone Encounter (Signed)
New Message    Needs you to fill out forms and send them back to them it from [employer]? For disability

## 2017-04-13 NOTE — Telephone Encounter (Signed)
Return to work authorization paperwork complete and faxed to the number provided.

## 2017-04-14 NOTE — Telephone Encounter (Signed)
FMLA paperwork complete, signed and given to medical records.

## 2017-04-16 ENCOUNTER — Ambulatory Visit (INDEPENDENT_AMBULATORY_CARE_PROVIDER_SITE_OTHER): Payer: Managed Care, Other (non HMO) | Admitting: Pharmacist Clinician (PhC)/ Clinical Pharmacy Specialist

## 2017-04-16 DIAGNOSIS — Z7901 Long term (current) use of anticoagulants: Secondary | ICD-10-CM | POA: Diagnosis not present

## 2017-04-16 LAB — POCT INR: INR: 5.3

## 2017-04-16 MED ORDER — WARFARIN SODIUM 3 MG PO TABS
ORAL_TABLET | ORAL | 3 refills | Status: DC
Start: 2017-04-16 — End: 2017-08-10

## 2017-04-17 ENCOUNTER — Ambulatory Visit: Payer: Managed Care, Other (non HMO) | Admitting: Cardiology

## 2017-04-21 ENCOUNTER — Telehealth: Payer: Self-pay | Admitting: Pharmacist Clinician (PhC)/ Clinical Pharmacy Specialist

## 2017-04-21 NOTE — Telephone Encounter (Signed)
LMOM for patient - received message that patient may be having an ablation in the near future.  Will need weekly INRs before MD appointment.    LMOM for patient to return call to reschedule.

## 2017-04-23 NOTE — Telephone Encounter (Signed)
Appointment moved to Friday

## 2017-04-27 NOTE — Addendum Note (Signed)
Addendum  created 04/27/17 1444 by Oleta Mouse, MD   Sign clinical note

## 2017-04-29 ENCOUNTER — Emergency Department (HOSPITAL_COMMUNITY): Payer: Managed Care, Other (non HMO)

## 2017-04-29 ENCOUNTER — Observation Stay (HOSPITAL_COMMUNITY): Payer: Managed Care, Other (non HMO)

## 2017-04-29 ENCOUNTER — Inpatient Hospital Stay (HOSPITAL_COMMUNITY)
Admission: EM | Admit: 2017-04-29 | Discharge: 2017-05-16 | DRG: 291 | Disposition: A | Discharge status: Home Health | Payer: Managed Care, Other (non HMO) | Attending: Interventional Cardiology | Admitting: Interventional Cardiology

## 2017-04-29 ENCOUNTER — Encounter (HOSPITAL_COMMUNITY): Payer: Self-pay | Admitting: *Deleted

## 2017-04-29 ENCOUNTER — Institutional Professional Consult (permissible substitution): Payer: Managed Care, Other (non HMO) | Admitting: Internal Medicine

## 2017-04-29 DIAGNOSIS — L8990 Pressure ulcer of unspecified site, unspecified stage: Secondary | ICD-10-CM | POA: Insufficient documentation

## 2017-04-29 DIAGNOSIS — E870 Hyperosmolality and hypernatremia: Secondary | ICD-10-CM | POA: Diagnosis not present

## 2017-04-29 DIAGNOSIS — I130 Hypertensive heart and chronic kidney disease with heart failure and stage 1 through stage 4 chronic kidney disease, or unspecified chronic kidney disease: Secondary | ICD-10-CM | POA: Diagnosis not present

## 2017-04-29 DIAGNOSIS — Z79899 Other long term (current) drug therapy: Secondary | ICD-10-CM

## 2017-04-29 DIAGNOSIS — Z720 Tobacco use: Secondary | ICD-10-CM | POA: Diagnosis present

## 2017-04-29 DIAGNOSIS — T45515A Adverse effect of anticoagulants, initial encounter: Secondary | ICD-10-CM | POA: Diagnosis not present

## 2017-04-29 DIAGNOSIS — E669 Obesity, unspecified: Secondary | ICD-10-CM | POA: Diagnosis present

## 2017-04-29 DIAGNOSIS — J9621 Acute and chronic respiratory failure with hypoxia: Secondary | ICD-10-CM | POA: Diagnosis present

## 2017-04-29 DIAGNOSIS — I952 Hypotension due to drugs: Secondary | ICD-10-CM | POA: Diagnosis not present

## 2017-04-29 DIAGNOSIS — I509 Heart failure, unspecified: Secondary | ICD-10-CM

## 2017-04-29 DIAGNOSIS — I4891 Unspecified atrial fibrillation: Secondary | ICD-10-CM

## 2017-04-29 DIAGNOSIS — Z7901 Long term (current) use of anticoagulants: Secondary | ICD-10-CM

## 2017-04-29 DIAGNOSIS — N185 Chronic kidney disease, stage 5: Secondary | ICD-10-CM | POA: Diagnosis not present

## 2017-04-29 DIAGNOSIS — I255 Ischemic cardiomyopathy: Secondary | ICD-10-CM | POA: Diagnosis present

## 2017-04-29 DIAGNOSIS — I2729 Other secondary pulmonary hypertension: Secondary | ICD-10-CM | POA: Diagnosis present

## 2017-04-29 DIAGNOSIS — N183 Chronic kidney disease, stage 3 (moderate): Secondary | ICD-10-CM | POA: Diagnosis present

## 2017-04-29 DIAGNOSIS — I5042 Chronic combined systolic (congestive) and diastolic (congestive) heart failure: Secondary | ICD-10-CM | POA: Diagnosis present

## 2017-04-29 DIAGNOSIS — J9602 Acute respiratory failure with hypercapnia: Secondary | ICD-10-CM | POA: Diagnosis not present

## 2017-04-29 DIAGNOSIS — J449 Chronic obstructive pulmonary disease, unspecified: Secondary | ICD-10-CM | POA: Diagnosis present

## 2017-04-29 DIAGNOSIS — I428 Other cardiomyopathies: Secondary | ICD-10-CM

## 2017-04-29 DIAGNOSIS — F17210 Nicotine dependence, cigarettes, uncomplicated: Secondary | ICD-10-CM | POA: Diagnosis present

## 2017-04-29 DIAGNOSIS — Z8249 Family history of ischemic heart disease and other diseases of the circulatory system: Secondary | ICD-10-CM

## 2017-04-29 DIAGNOSIS — I481 Persistent atrial fibrillation: Secondary | ICD-10-CM | POA: Diagnosis present

## 2017-04-29 DIAGNOSIS — R0602 Shortness of breath: Secondary | ICD-10-CM | POA: Diagnosis not present

## 2017-04-29 DIAGNOSIS — I2510 Atherosclerotic heart disease of native coronary artery without angina pectoris: Secondary | ICD-10-CM | POA: Diagnosis present

## 2017-04-29 DIAGNOSIS — R570 Cardiogenic shock: Secondary | ICD-10-CM | POA: Diagnosis not present

## 2017-04-29 DIAGNOSIS — E875 Hyperkalemia: Secondary | ICD-10-CM | POA: Diagnosis present

## 2017-04-29 DIAGNOSIS — E785 Hyperlipidemia, unspecified: Secondary | ICD-10-CM | POA: Diagnosis present

## 2017-04-29 DIAGNOSIS — I5023 Acute on chronic systolic (congestive) heart failure: Secondary | ICD-10-CM | POA: Diagnosis not present

## 2017-04-29 DIAGNOSIS — I63442 Cerebral infarction due to embolism of left cerebellar artery: Secondary | ICD-10-CM | POA: Diagnosis present

## 2017-04-29 DIAGNOSIS — I5043 Acute on chronic combined systolic (congestive) and diastolic (congestive) heart failure: Secondary | ICD-10-CM | POA: Diagnosis not present

## 2017-04-29 DIAGNOSIS — R531 Weakness: Secondary | ICD-10-CM

## 2017-04-29 DIAGNOSIS — I081 Rheumatic disorders of both mitral and tricuspid valves: Secondary | ICD-10-CM | POA: Diagnosis present

## 2017-04-29 DIAGNOSIS — G4730 Sleep apnea, unspecified: Secondary | ICD-10-CM | POA: Diagnosis not present

## 2017-04-29 DIAGNOSIS — G4733 Obstructive sleep apnea (adult) (pediatric): Secondary | ICD-10-CM | POA: Diagnosis present

## 2017-04-29 DIAGNOSIS — N170 Acute kidney failure with tubular necrosis: Secondary | ICD-10-CM | POA: Diagnosis not present

## 2017-04-29 DIAGNOSIS — J9601 Acute respiratory failure with hypoxia: Secondary | ICD-10-CM

## 2017-04-29 DIAGNOSIS — I5022 Chronic systolic (congestive) heart failure: Secondary | ICD-10-CM | POA: Diagnosis not present

## 2017-04-29 DIAGNOSIS — Z6841 Body Mass Index (BMI) 40.0 and over, adult: Secondary | ICD-10-CM

## 2017-04-29 DIAGNOSIS — J9690 Respiratory failure, unspecified, unspecified whether with hypoxia or hypercapnia: Secondary | ICD-10-CM

## 2017-04-29 DIAGNOSIS — M1990 Unspecified osteoarthritis, unspecified site: Secondary | ICD-10-CM | POA: Diagnosis present

## 2017-04-29 DIAGNOSIS — D689 Coagulation defect, unspecified: Secondary | ICD-10-CM | POA: Diagnosis not present

## 2017-04-29 DIAGNOSIS — I959 Hypotension, unspecified: Secondary | ICD-10-CM

## 2017-04-29 DIAGNOSIS — I7300 Raynaud's syndrome without gangrene: Secondary | ICD-10-CM | POA: Diagnosis present

## 2017-04-29 DIAGNOSIS — N189 Chronic kidney disease, unspecified: Secondary | ICD-10-CM

## 2017-04-29 DIAGNOSIS — Z8673 Personal history of transient ischemic attack (TIA), and cerebral infarction without residual deficits: Secondary | ICD-10-CM

## 2017-04-29 DIAGNOSIS — I5082 Biventricular heart failure: Secondary | ICD-10-CM | POA: Diagnosis present

## 2017-04-29 DIAGNOSIS — N178 Other acute kidney failure: Secondary | ICD-10-CM | POA: Diagnosis not present

## 2017-04-29 DIAGNOSIS — I2781 Cor pulmonale (chronic): Secondary | ICD-10-CM | POA: Diagnosis present

## 2017-04-29 DIAGNOSIS — J9622 Acute and chronic respiratory failure with hypercapnia: Secondary | ICD-10-CM | POA: Diagnosis present

## 2017-04-29 DIAGNOSIS — N179 Acute kidney failure, unspecified: Secondary | ICD-10-CM

## 2017-04-29 HISTORY — DX: Unspecified atrial fibrillation: I48.91

## 2017-04-29 HISTORY — DX: Cerebral infarction, unspecified: I63.9

## 2017-04-29 LAB — COMPREHENSIVE METABOLIC PANEL
ALT: 16 U/L (ref 14–54)
ANION GAP: 11 (ref 5–15)
AST: 12 U/L — AB (ref 15–41)
Albumin: 3.2 g/dL — ABNORMAL LOW (ref 3.5–5.0)
Alkaline Phosphatase: 85 U/L (ref 38–126)
BILIRUBIN TOTAL: 1.3 mg/dL — AB (ref 0.3–1.2)
BUN: 80 mg/dL — ABNORMAL HIGH (ref 6–20)
CHLORIDE: 103 mmol/L (ref 101–111)
CO2: 24 mmol/L (ref 22–32)
Calcium: 8.8 mg/dL — ABNORMAL LOW (ref 8.9–10.3)
Creatinine, Ser: 4.35 mg/dL — ABNORMAL HIGH (ref 0.44–1.00)
GFR, EST AFRICAN AMERICAN: 11 mL/min — AB (ref >60)
GFR, EST NON AFRICAN AMERICAN: 10 mL/min — AB (ref >60)
Glucose, Bld: 115 mg/dL — ABNORMAL HIGH (ref 65–99)
POTASSIUM: 5.3 mmol/L — AB (ref 3.5–5.1)
Sodium: 138 mmol/L (ref 135–145)
TOTAL PROTEIN: 6.3 g/dL — AB (ref 6.5–8.1)

## 2017-04-29 LAB — URINALYSIS, ROUTINE W REFLEX MICROSCOPIC
BILIRUBIN URINE: NEGATIVE
GLUCOSE, UA: NEGATIVE mg/dL
HGB URINE DIPSTICK: NEGATIVE
KETONES UR: NEGATIVE mg/dL
LEUKOCYTES UA: NEGATIVE
NITRITE: POSITIVE — AB
PH: 6 (ref 5.0–8.0)
Protein, ur: 100 mg/dL — AB
Specific Gravity, Urine: 1.014 (ref 1.005–1.030)

## 2017-04-29 LAB — I-STAT TROPONIN, ED
Troponin i, poc: 0.03 ng/mL (ref 0.00–0.08)
Troponin i, poc: 0.04 ng/mL (ref 0.00–0.08)

## 2017-04-29 LAB — GLUCOSE, CAPILLARY: GLUCOSE-CAPILLARY: 72 mg/dL (ref 65–99)

## 2017-04-29 LAB — CBC WITH DIFFERENTIAL/PLATELET
Basophils Absolute: 0 10*3/uL (ref 0.0–0.1)
Basophils Relative: 0 %
Eosinophils Absolute: 0.1 10*3/uL (ref 0.0–0.7)
Eosinophils Relative: 1 %
HEMATOCRIT: 44.3 % (ref 36.0–46.0)
HEMOGLOBIN: 13.8 g/dL (ref 12.0–15.0)
LYMPHS ABS: 0.6 10*3/uL — AB (ref 0.7–4.0)
Lymphocytes Relative: 8 %
MCH: 27.9 pg (ref 26.0–34.0)
MCHC: 31.2 g/dL (ref 30.0–36.0)
MCV: 89.7 fL (ref 78.0–100.0)
MONO ABS: 0.7 10*3/uL (ref 0.1–1.0)
MONOS PCT: 9 %
NEUTROS ABS: 6.2 10*3/uL (ref 1.7–7.7)
NEUTROS PCT: 82 %
Platelets: 137 10*3/uL — ABNORMAL LOW (ref 150–400)
RBC: 4.94 MIL/uL (ref 3.87–5.11)
RDW: 16.4 % — AB (ref 11.5–15.5)
WBC: 7.6 10*3/uL (ref 4.0–10.5)

## 2017-04-29 LAB — I-STAT ARTERIAL BLOOD GAS, ED
ACID-BASE DEFICIT: 1 mmol/L (ref 0.0–2.0)
Acid-Base Excess: 1 mmol/L (ref 0.0–2.0)
BICARBONATE: 28.3 mmol/L — AB (ref 20.0–28.0)
Bicarbonate: 29.2 mmol/L — ABNORMAL HIGH (ref 20.0–28.0)
O2 SAT: 91 %
O2 Saturation: 97 %
PH ART: 7.252 — AB (ref 7.350–7.450)
PO2 ART: 74 mmHg — AB (ref 83.0–108.0)
Patient temperature: 98.6
Patient temperature: 98.6
TCO2: 30 mmol/L (ref 0–100)
TCO2: 31 mmol/L (ref 0–100)
pCO2 arterial: 64.2 mmHg — ABNORMAL HIGH (ref 32.0–48.0)
pCO2 arterial: 63.7 mmHg — ABNORMAL HIGH (ref 32.0–48.0)
pH, Arterial: 7.269 — ABNORMAL LOW (ref 7.350–7.450)
pO2, Arterial: 106 mmHg (ref 83.0–108.0)

## 2017-04-29 LAB — TROPONIN I: Troponin I: 0.05 ng/mL (ref <0.03)

## 2017-04-29 LAB — I-STAT CG4 LACTIC ACID, ED: Lactic Acid, Venous: 1.39 mmol/L (ref 0.5–1.9)

## 2017-04-29 LAB — CBG MONITORING, ED: GLUCOSE-CAPILLARY: 116 mg/dL — AB (ref 65–99)

## 2017-04-29 LAB — POTASSIUM: Potassium: 5.1 mmol/L (ref 3.5–5.1)

## 2017-04-29 LAB — PROTIME-INR
INR: 4.81
Prothrombin Time: 46.4 seconds — ABNORMAL HIGH (ref 11.4–15.2)

## 2017-04-29 LAB — SODIUM, URINE, RANDOM

## 2017-04-29 LAB — CREATININE, URINE, RANDOM: Creatinine, Urine: 183.45 mg/dL

## 2017-04-29 LAB — MRSA PCR SCREENING: MRSA by PCR: NEGATIVE

## 2017-04-29 LAB — BRAIN NATRIURETIC PEPTIDE: B Natriuretic Peptide: 2712.1 pg/mL — ABNORMAL HIGH (ref 0.0–100.0)

## 2017-04-29 MED ORDER — ONDANSETRON HCL 4 MG/2ML IJ SOLN: 4.0000 mg | Freq: Four times a day (QID) | INTRAMUSCULAR | Status: DC | PRN

## 2017-04-29 MED ORDER — CARVEDILOL 6.25 MG PO TABS
6.2500 mg | ORAL_TABLET | Freq: Two times a day (BID) | ORAL | Status: DC
  Administered 2017-04-29: 6.25 mg via ORAL
  Filled 2017-04-29: qty 1

## 2017-04-29 MED ORDER — SODIUM POLYSTYRENE SULFONATE 15 GM/60ML PO SUSP
15.0000 g | Freq: Once | ORAL | Status: AC
  Administered 2017-04-29: 15 g via ORAL
  Filled 2017-04-29: qty 60

## 2017-04-29 MED ORDER — IPRATROPIUM BROMIDE 0.02 % IN SOLN
0.5000 mg | Freq: Four times a day (QID) | RESPIRATORY_TRACT | Status: DC
  Administered 2017-04-29 – 2017-05-01 (×7): 0.5 mg via RESPIRATORY_TRACT
  Filled 2017-04-29 (×9): qty 2.5

## 2017-04-29 MED ORDER — FUROSEMIDE 10 MG/ML IJ SOLN: 40.0000 mg | Freq: Once | INTRAMUSCULAR | Status: DC

## 2017-04-29 MED ORDER — ROSUVASTATIN CALCIUM 10 MG PO TABS
10.0000 mg | ORAL_TABLET | Freq: Every day | ORAL | Status: DC
  Administered 2017-04-29 – 2017-05-16 (×18): 10 mg via ORAL
  Filled 2017-04-29 (×19): qty 1

## 2017-04-29 MED ORDER — SODIUM CHLORIDE 0.9% FLUSH
3.0000 mL | Freq: Two times a day (BID) | INTRAVENOUS | Status: DC
  Administered 2017-04-29 – 2017-05-08 (×10): 3 mL via INTRAVENOUS

## 2017-04-29 MED ORDER — SODIUM CHLORIDE 0.9% FLUSH
3.0000 mL | INTRAVENOUS | Status: DC | PRN
  Administered 2017-04-29 – 2017-05-10 (×3): 3 mL via INTRAVENOUS
  Filled 2017-04-29 (×3): qty 3

## 2017-04-29 MED ORDER — ACETAMINOPHEN 325 MG PO TABS: 650.0000 mg | ORAL_TABLET | ORAL | Status: DC | PRN

## 2017-04-29 MED ORDER — WARFARIN SODIUM 3 MG PO TABS: 3.0000 mg | ORAL_TABLET | Freq: Every day | ORAL | Status: DC

## 2017-04-29 MED ORDER — SODIUM CHLORIDE 0.9 % IV SOLN: 250.0000 mL | INTRAVENOUS | Status: DC | PRN

## 2017-04-29 NOTE — ED Notes (Signed)
Patient transported to CT 

## 2017-04-29 NOTE — ED Triage Notes (Signed)
Pt here via [facility] for increasingly slurred speech since last night at 9pm and increasing difficulty moving L leg x 1 week.  Hypotensive at 80/palp.  Given 200 ns en-route with bp increase to 96/60.  Pt ao x 4.  CBG 149.

## 2017-04-29 NOTE — ED Notes (Signed)
Attempted report x1. 

## 2017-04-29 NOTE — Consult Note (Signed)
[facility] Attending Consult  Physician Requesting Consult:  Daneen Schick, M.D/ Cardiology  Date of Consult:  04/29/2017  Reason for Consult/Chief Complaint:  Acute Hypercarbic Respiratory Failure  History of Presenting Illness:  66 y.o. female with known history of atrial fibrillation beginning in 2017. Patient has a known history of non-obstructive coronary artery disease as well as systolic congestive heart failure and some element of cor pulmonale. Patient is on Coumadin chronically for systemic anticoagulation. Her most recent transesophageal echocardiogram in May during cardioversion showed worsening of her left ventricular dysfunction and cor pulmonale. Patient has been treated with Lasix twice daily and was previously started on Entresto.  she reports her lower extremity edema had progressively improved but she had been experiencing increasing fatigue and dyspnea on exertion. Patient denies any orthopnea but has experienced some intermittent paroxysmal nocturnal dyspnea. She denies any chest pain, pressure, or tightness. She denies any cough or wheezing. She denies any subjective fever, chills, or sweats. She denies any sore throat or sinus congestion. EMS was called today with worsening respiratory distress. She was noted to be hypotensive and was given a small IV fluid bolus. The patient had questionable hypoxia obscured by her fingernail treatment and was ultimately found to have acute hypercarbic respiratory failure as well as acute renal failure during her workup. With patient's multiple medical problems and acute hypercarbic respiratory failure we were consulted to provide further assistance in her workup.   Review of Systems:  No abdominal pain or nausea.  No near syncope. A pertinent 14 point review of systems is negative except as per the history of presenting illness.  Allergies  Allergen Reactions  . Ace Inhibitors Palpitations    Current  Facility-Administered Medications on File Prior to Encounter  Medication Dose Route Frequency Provider Last Rate Last Dose  . sacubitril-valsartan (ENTRESTO) 49-51 mg per tablet  1 tablet Oral BID [name], MD       Current Outpatient Prescriptions on File Prior to Encounter  Medication Sig Dispense Refill  . amiodarone (PACERONE) 200 MG tablet TAKE ONE TABLET TWICE DAILY X 2 WEEKS THEN DECREASE TO ONE TABLET DAILY (Patient taking differently: Take 200 mg by mouth daily. TAKE ONE TABLET TWICE DAILY X 2 WEEKS THEN DECREASE TO ONE TABLET DAILY) 120 tablet 3  . carvedilol (COREG) 6.25 MG tablet Take 1 tablet (6.25 mg total) by mouth 2 (two) times daily. 180 tablet 3  . furosemide (LASIX) 40 MG tablet Take 1.5 tablets (60 mg total) by mouth 2 (two) times daily. (Patient taking differently: Take 80 mg by mouth 2 (two) times daily. ) 270 tablet 3  . potassium chloride SA (KLOR-CON M20) 20 MEQ tablet Take 1 tablet (20 mEq total) by mouth 2 (two) times daily. 180 tablet 3  . rosuvastatin (CRESTOR) 10 MG tablet Take 1 tablet (10 mg total) by mouth daily. 90 tablet 3  . warfarin (COUMADIN) 3 MG tablet Take 1 tablet by mouth daily or as directed by coumadin clinic (Patient taking differently: Take 3 mg by mouth daily. ) 30 tablet 3  . acetaminophen (TYLENOL) 325 MG tablet Take 650 mg by mouth every 6 (six) hours as needed (pain).     Marland Kitchen diclofenac sodium (VOLTAREN) 1 % GEL Apply 4 g topically 4 (four) times daily as needed (As directed for skin).    . sacubitril-valsartan (ENTRESTO) 49-51 MG Take 1 tablet by mouth 2 (two) times daily. (Patient not taking: Reported on 04/29/2017) 60 tablet 5  Past Medical History:  Diagnosis Date  . Arthritis   . CAD in native artery, s/p cardiac cath with non obstructive CAD 10/24/2016  . CHF (congestive heart failure) ([place])   . DIVERTICULITIS, HX OF 07/25/2007  . DIVERTICULOSIS, COLON 07/22/2007  . Dyspnea   . Edema, peripheral    a. chronic BLE edema, R>L.  Prior trauma from dog attack and accident.  Marland Kitchen HYPERLIPIDEMIA 02/03/2008  . Hypersomnia    declines w/u  . Hypertension   . MENOPAUSAL DISORDER 01/09/2011  . Morbid obesity ([place]) 07/22/2007  . NICM (nonischemic cardiomyopathy) ([place]) 10/24/2016  . Raynaud's syndrome 07/22/2007  . Stroke ([place]) 2017  . THYROID NODULE, RIGHT 01/04/2010  . VITAMIN D DEFICIENCY 01/09/2011   Qualifier: Diagnosis of  By: [name], [name]     Past Surgical History:  Procedure Laterality Date  . CARDIAC CATHETERIZATION N/A 10/23/2016   Procedure: Left Heart Cath and Coronary Angiography;  Surgeon: [name], MD;  Location: [facility];  Service: Cardiovascular;  Laterality: N/A;  . CARDIOVERSION N/A 10/31/2016   Procedure: CARDIOVERSION;  Surgeon: [name], MD;  Location: [facility] ENDOSCOPY;  Service: Cardiovascular;  Laterality: N/A;  . CARDIOVERSION N/A 11/03/2016   Procedure: CARDIOVERSION;  Surgeon: [name], MD;  Location: [facility] ENDOSCOPY;  Service: Cardiovascular;  Laterality: N/A;  . CARDIOVERSION N/A 11/18/2016   Procedure: CARDIOVERSION;  Surgeon: [name], MD;  Location: [facility] ENDOSCOPY;  Service: Cardiovascular;  Laterality: N/A;  . CARDIOVERSION N/A 03/25/2017   Procedure: CARDIOVERSION;  Surgeon: [name], MD;  Location: [facility] ENDOSCOPY;  Service: Cardiovascular;  Laterality: N/A;  . CHOLECYSTECTOMY    . COLONOSCOPY    . PARTIAL HYSTERECTOMY     1 OVARY LEFT  . POLYPECTOMY    . TEE WITHOUT CARDIOVERSION N/A 10/31/2016   Procedure: TRANSESOPHAGEAL ECHOCARDIOGRAM (TEE);  Surgeon: [name], MD;  Location: [facility] ENDOSCOPY;  Service: Cardiovascular;  Laterality: N/A;  . TEE WITHOUT CARDIOVERSION N/A 03/25/2017   Procedure: TRANSESOPHAGEAL ECHOCARDIOGRAM (TEE);  Surgeon: [name], MD;  Location: [facility] ENDOSCOPY;  Service: Cardiovascular;  Laterality: N/A;    Family History  Problem Relation Age of Onset  . Asthma Mother   . Diabetes Father   . Heart disease Father        Died  of presumed heart attack - 71s  . Lung disease Sister   . Heart disease Sister        Twin sister has heart issue, unclear what kind  . Thyroid disease Neg Hx     Social History   Social History  . Marital status: Single    Spouse name: N/A  . Number of children: 1  . Years of education: N/A   Occupational History  . Printing     Social History Main Topics  . Smoking status: Current Every Day Smoker    Packs/day: 0.50    Types: Cigarettes    Last attempt to quit: 10/23/2016  . Smokeless tobacco: Never Used     Comment: Smoked for 50 years  . Alcohol use 2.4 oz/week    4 Cans of beer per week     Comment: Intermittent  . Drug use: No  . Sexual activity: Not Asked   Other Topics Concern  . None   Social History Narrative  . None   FiO2 (%):  [40 %] 40 %  Temp:  [97.6 F (36.4 C)-98 F (36.7 C)] 98 F (36.7 C) (06/06 1217) Pulse Rate:  [54-93]  90 (06/06 1645) Resp:  [12-25] 14 (06/06 1645) BP: (80-117)/(52-96) 115/96 (06/06 1630) SpO2:  [93 %-99 %] 99 % (06/06 1645) FiO2 (%):  [40 %] 40 % (06/06 1324) Weight:  [288 lb (130.6 kg)] 288 lb (130.6 kg) (06/06 0912)  General:  No distress. No family at bedside. Sleeping until awoken. Integument:  Warm & dry. No rash or bruising on exposed skin.  Extremities:  No cyanosis or clubbing.  Lymphatics:  No appreciated cervical or supraclavicular lymphadenoapthy. HEENT:  Tacky mucus membranes. No scleral icterus. BiPAP mask in place. Cardiovascular:  Regular rate. Trace edema. No JVD appreciated given positioning & body habitus.  Pulmonary:  Distant breath sounds bilaterally. Normal work of breathing on BiPAP. Abdomen: Soft. Normoactive bowel sounds. Nondistended. Protuberant.  Musculoskeletal:  Normal bulk. No joint deformity or effusion appreciated. Hand grip 5/5 bilaterally. Neurological:  No meningismus or nuchal rigidity. Pupils symmetric and reactrive.  Psychiatric:  Mood and affect congruent. Oriented x4.    LINES/TUBES: PIV  CBC Latest Ref Rng & Units 04/29/2017 03/25/2017 11/24/2016  WBC 4.0 - 10.5 K/uL 7.6 - 6.4  Hemoglobin 12.0 - 15.0 g/dL 13.8 15.0 14.3  Hematocrit 36.0 - 46.0 % 44.3 44.0 45.3  Platelets 150 - 400 K/uL 137(L) - 139(L)    BMP Latest Ref Rng & Units 04/29/2017 03/25/2017 01/20/2017  Glucose 65 - 99 mg/dL 115(H) 96 122(H)  BUN 6 - 20 mg/dL 80(H) 30(H) 24  Creatinine 0.44 - 1.00 mg/dL 4.35(H) 1.60(H) 1.57(H)  Sodium 135 - 145 mmol/L 138 145 142  Potassium 3.5 - 5.1 mmol/L 5.3(H) 3.1(L) 4.1  Chloride 101 - 111 mmol/L 103 99(L) 103  CO2 22 - 32 mmol/L 24 - 33(H)  Calcium 8.9 - 10.3 mg/dL 8.8(L) - 8.9    Hepatic Function Latest Ref Rng & Units 04/29/2017 11/20/2016 11/13/2016  Total Protein 6.5 - 8.1 g/dL 6.3(L) 6.6 6.8  Albumin 3.5 - 5.0 g/dL 3.2(L) 3.1(L) 3.2(L)  AST 15 - 41 U/L 12(L) 65(H) 114(H)  ALT 14 - 54 U/L 16 51 76(H)  Alk Phosphatase 38 - 126 U/L 85 60 54  Total Bilirubin 0.3 - 1.2 mg/dL 1.3(H) 0.6 0.7  Bilirubin, Direct 0.1 - 0.5 mg/dL - - -    IMAGING/STUDIES: TTE 11/21/16:  LV mild to moderately dilated. Mild LVH. EF 20-25% with diffuse hypokinesis and indeterminant diastolic function in the setting of atrial fibrillation. LA severely dilated & RA moderately dilated. RV normal in size with moderately reduced systolic function. No aortic stenosis or regurgitation. Aortic root normal in size. Trivial mitral regurgitation without stenosis. No pulmonic regurgitation. No significant tricuspid regurgitation. Trivial pericardial effusion posterior to the heart.  TEE 03/25/17:  LV EF 15% with diffuse hypokinesis. LA moderately dilated without thrombus. RA mildly dilated. RV moderately dilated with severely reduced systolic function. No aortic regurgitation. No PFO. No pulmonic regurgitation. Moderate tricuspid regurgitation. Trivial pericardial effusion.  PORT CXR 6/6:  Personally reviewed by me. No focal opacity or effusion appreciated. Questionable mild pulmonary vascular  congestion. Suggestion of cardiomegaly.  CT HEAD W/O 6/6:  No acute finding by CT. 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 Old right posterior parietal cortical infarction.  RENAL U/S 6/6:  Unremarkable limited ultrasound of the kidneys and bladder, with no hydronephrosis.  MICROBIOLOGY: None.  ANTIBIOTICS: None.  SIGNIFICANT EVENTS: 06/06 - Admit & PCCM consult for acute hypercarbic respiratory failure  ASSESSMENT/PLAN:  65 y.o.  female with known atrial fibrillation and systolic congestive heart failure  with cor pulmonale. Patient does have ongoing tobacco use and given the chronicity this would suggest the probability of underlying COPD. However, the patient has no wheezing on physical exam today. Her hypotension upon arrival has resolved at the time of my exam patient. With BiPAP the patient had normal work of breathing as well as normal cognition at the time of my exam. I reviewed the patient's chest x-ray imaging which shows no reason for her hypoxia which was previously reported and I suspect artifactual if not secondary to her hypotension. It is conceivable that the patient does have some element of obstructive sleep apnea given her body habitus but has never undergone workup. Her renal ultrasound on arrival shows no hydronephrosis and nephrology has been consulted.   1. Acute hypercarbic respiratory failure: Suspect possible underlying obstructive sleep apnea. No clear evidence of COPD exacerbation. Continuing BiPAP as needed for increased work of breathing. 2. Hypotension: Multifactorial and suspect secondary to overdiuresis in the setting of biventricular heart failure. Resolved. Continuing to monitor vitals per unit protocol. Recommend continuing to hold diuresis for now and also avoid bolus IV fluid which could worsen her pulmonary status. 3. Probable COPD: No clear signs of exacerbation. Starting Atrovent nebulized every 6  hours. 4. Coagulopathy: Likely secondary to Coumadin. No signs of active bleeding. Agree with holding Coumadin while trending INR. No role for reversal at this time. 5. Hyperkalemia: Mild. Likely secondary to acute renal failure. Kayexalate already ordered by mouth. 6. Acute renal failure: Likely secondary to hypotension. Nephrology consulted and following. 7. Mild relative metabolic acidosis: Likely secondary to acute renal failure. 8. Chronic tobacco use disorder: Recommend tobacco cessation education prior to discharge. 9. Cor pulmonale: Patient needs screening for possible underlying sleep apnea as an outpatient. 10. Biventricular heart failure: Management per cardiology and heart failure service. Patient continuing on Coreg & Crestor. 11. Atrial fibrillation: Continuous telemetry monitoring ordered.  Prophylaxis: Currently coagulopathic. Diet: Nothing by mouth except for meds. Code Status: Full code as per my discussion with the patient today during my exam. Disposition: As per primary service. Patient being admitted to the ICU. Family Update:  Patient updated at the time of my exam.  I have personally spent a total of 31 minutes of critical care time today caring for the patient, updating the patient at bedside, & reviewing the patient's electronic medical record.  Remainder of care as per primary service and nephrology consult.  Sonia Baller Ashok Cordia, M.D. [facility] Pager:  [phone] After 3pm or if no response, call [phone] 5:21 PM 04/29/17

## 2017-04-29 NOTE — ED Notes (Signed)
Radiology at bedside

## 2017-04-29 NOTE — ED Notes (Signed)
Placed on 2L [place] for increased rr, diminished lung sounds and exp wheezing.

## 2017-04-29 NOTE — ED Notes (Signed)
Due to fake nails and bright fingernail polish it is difficult to pick up O2 sat.  When it does pick up it is noted to be in mid to upper 90's.

## 2017-04-29 NOTE — Progress Notes (Signed)
CRITICAL VALUE ALERT  Critical Value: troponin  Date & Time Notied:  04/29/2017 2048  Provider Notified: Dr. Aundra Dubin  Orders Received/Actions taken:

## 2017-04-29 NOTE — ED Provider Notes (Addendum)
[place] DEPT Provider Note   CSN: [id number] Arrival date & time: 04/29/17  0909     History   Chief Complaint Chief Complaint  Patient presents with  . Aphasia    HPI KRISTILYN COLTRANE is a 66 y.o. female.  HPI Patient has been noting some general increased weakness for the past several days. She reports her speech is seem somewhat slurred. This did not start suddenly. She reports today however she went to the bathroom and felt like she couldn't get her legs to carry her back to the bedroom. This was not localizing to one extremity or the other. History obtained from EMS had some variation. Per EMS report patient reported increased difficulty moving the left leg for one week. I did not obtain a similar history. Patient was reportedly hypo-intensive on arrival with systolic blood pressure of 80. 200 mL normal saline administered on route. Patient is endorsing "slight chest pains" this morning. She does not qualify them is something to be concerned about a cousin is not severe. She reports her lower extremity edema although swollen is not significantly swollen compared to previous events. She reports compliance with her medications. Patient reports that she was due for an appointment with Dr. Jeneen Rinks this morning but due to her change in condition presented to the emergency department. Past Medical History:  Diagnosis Date  . Arthritis   . CAD in native artery, s/p cardiac cath with non obstructive CAD 10/24/2016  . CHF (congestive heart failure) ([place])   . DIVERTICULITIS, HX OF 07/25/2007  . DIVERTICULOSIS, COLON 07/22/2007  . Dyspnea   . Edema, peripheral    a. chronic BLE edema, R>L. Prior trauma from dog attack and accident.  Marland Kitchen HYPERLIPIDEMIA 02/03/2008  . Hypersomnia    declines w/u  . Hypertension   . MENOPAUSAL DISORDER 01/09/2011  . Morbid obesity ([place]) 07/22/2007  . NICM (nonischemic cardiomyopathy) ([place]) 10/24/2016  . Raynaud's syndrome 07/22/2007  . Stroke ([place]) 2017  . THYROID  NODULE, RIGHT 01/04/2010  . VITAMIN D DEFICIENCY 01/09/2011   Qualifier: Diagnosis of  By: [name], [name]     Patient Active Problem List   Diagnosis Date Noted  . Atrial fibrillation ([place])   . Bradycardia 11/20/2016  . Stroke ([place]) 11/20/2016  . Chronic systolic congestive heart failure ([place]) 11/20/2016  . Cerebrovascular accident (CVA) due to embolism of left cerebellar artery ([place])   . Persistent atrial fibrillation ([place]) 11/03/2016  . Paroxysmal atrial fibrillation ([facility])   . CHF (congestive heart failure) ([place]) 10/31/2016  . NICM (nonischemic cardiomyopathy) ([place]) 10/24/2016  . CAD in native artery, s/p cardiac cath with non obstructive CAD 10/24/2016  . Acute systolic congestive heart failure ([place])   . Atrial fibrillation with rapid ventricular response ([place]) 10/20/2016  . Tobacco abuse 10/20/2016  . Obesity 10/20/2016  . CKD (chronic kidney disease), stage III 10/20/2016  . Acute CHF (congestive heart failure) ([place]) 10/20/2016  . Atrial fibrillation with RVR ([place]) 10/20/2016  . Hematochezia 06/29/2013  . Degenerative arthritis of left knee 03/15/2012  . Preventative health care 03/12/2012  . Hypersomnia   . VITAMIN D DEFICIENCY 01/09/2011  . MENOPAUSAL DISORDER 01/09/2011  . THYROID NODULE, RIGHT 01/04/2010  . HLD (hyperlipidemia) 02/03/2008  . LEG CRAMPS, NOCTURNAL 02/03/2008  . LOW BACK PAIN 07/25/2007  . DIVERTICULITIS, HX OF 07/25/2007  . Morbid obesity ([place]) 07/22/2007  . Essential hypertension 07/22/2007  . Raynaud's syndrome 07/22/2007  . DIVERTICULOSIS, COLON 07/22/2007  . SYMPTOM, EDEMA 07/22/2007    Past  Surgical History:  Procedure Laterality Date  . CARDIAC CATHETERIZATION N/A 10/23/2016   Procedure: Left Heart Cath and Coronary Angiography;  Surgeon: [name], MD;  Location: [place] CV LAB;  Service: Cardiovascular;  Laterality: N/A;  . CARDIOVERSION N/A 10/31/2016   Procedure: CARDIOVERSION;  Surgeon: [name], MD;  Location: [facility]  ENDOSCOPY;  Service: Cardiovascular;  Laterality: N/A;  . CARDIOVERSION N/A 11/03/2016   Procedure: CARDIOVERSION;  Surgeon: [name], MD;  Location: [facility] ENDOSCOPY;  Service: Cardiovascular;  Laterality: N/A;  . CARDIOVERSION N/A 11/18/2016   Procedure: CARDIOVERSION;  Surgeon: [name], MD;  Location: [facility] ENDOSCOPY;  Service: Cardiovascular;  Laterality: N/A;  . CARDIOVERSION N/A 03/25/2017   Procedure: CARDIOVERSION;  Surgeon: [name], MD;  Location: [facility] ENDOSCOPY;  Service: Cardiovascular;  Laterality: N/A;  . CHOLECYSTECTOMY    . COLONOSCOPY    . PARTIAL HYSTERECTOMY     1 OVARY LEFT  . POLYPECTOMY    . TEE WITHOUT CARDIOVERSION N/A 10/31/2016   Procedure: TRANSESOPHAGEAL ECHOCARDIOGRAM (TEE);  Surgeon: [name], MD;  Location: [facility] ENDOSCOPY;  Service: Cardiovascular;  Laterality: N/A;  . TEE WITHOUT CARDIOVERSION N/A 03/25/2017   Procedure: TRANSESOPHAGEAL ECHOCARDIOGRAM (TEE);  Surgeon: [name], MD;  Location: [facility] ENDOSCOPY;  Service: Cardiovascular;  Laterality: N/A;    OB History    No data available       Home Medications    Prior to Admission medications   Medication Sig Start Date End Date Taking? Authorizing Provider  amiodarone (PACERONE) 200 MG tablet TAKE ONE TABLET TWICE DAILY X 2 WEEKS THEN DECREASE TO ONE TABLET DAILY Patient taking differently: Take 200 mg by mouth daily. TAKE ONE TABLET TWICE DAILY X 2 WEEKS THEN DECREASE TO ONE TABLET DAILY 03/19/17  Yes [name], MD  carvedilol (COREG) 6.25 MG tablet Take 1 tablet (6.25 mg total) by mouth 2 (two) times daily. 03/26/17  Yes [name], MD  furosemide (LASIX) 40 MG tablet Take 1.5 tablets (60 mg total) by mouth 2 (two) times daily. Patient taking differently: Take 80 mg by mouth 2 (two) times daily.  02/25/17  Yes Biagio Borg, MD  naproxen sodium (ANAPROX) 220 MG tablet Take 220 mg by mouth daily as needed (pain).   Yes [provider]  potassium chloride SA  (KLOR-CON M20) 20 MEQ tablet Take 1 tablet (20 mEq total) by mouth 2 (two) times daily. 03/26/17  Yes [name], MD  rosuvastatin (CRESTOR) 10 MG tablet Take 1 tablet (10 mg total) by mouth daily. 12/04/16  Yes Biagio Borg, MD  warfarin (COUMADIN) 3 MG tablet Take 1 tablet by mouth daily or as directed by coumadin clinic Patient taking differently: Take 3 mg by mouth daily.  04/16/17  Yes [name], MD  acetaminophen (TYLENOL) 325 MG tablet Take 650 mg by mouth every 6 (six) hours as needed (pain).     [provider]  diclofenac sodium (VOLTAREN) 1 % GEL Apply 4 g topically 4 (four) times daily as needed (As directed for skin).    [provider]  sacubitril-valsartan (ENTRESTO) 49-51 MG Take 1 tablet by mouth 2 (two) times daily. Patient not taking: Reported on 04/29/2017 04/06/17   [name], MD    Family History Family History  Problem Relation Age of Onset  . Asthma Mother   . Diabetes Father   . Heart disease Father        Died of presumed heart attack -  20s  . Lung disease Sister   . Heart disease Sister        Twin sister has heart issue, unclear what kind  . Thyroid disease Neg Hx     Social History Social History  Substance Use Topics  . Smoking status: Current Every Day Smoker    Packs/day: 0.50    Types: Cigarettes    Last attempt to quit: 10/23/2016  . Smokeless tobacco: Never Used     Comment: Smoked for 50 years  . Alcohol use 2.4 oz/week    4 Cans of beer per week     Comment: Intermittent     Allergies   Ace inhibitors   Review of Systems Review of Systems 10 Systems reviewed and are negative for acute change except as noted in the HPI.   Physical Exam Updated Vital Signs BP 110/84   Pulse 76   Temp 98 F (36.7 C)   Resp 13   Ht 5\' 5"  (1.651 m)   Wt 130.6 kg (288 lb)   SpO2 94%   BMI 47.93 kg/m   Physical Exam  Constitutional:  Patient is awake and alert but seems very mildly confused. Mild increased  work of breathing.   HENT:  Head: Normocephalic and atraumatic.  Mouth/Throat: Oropharynx is clear and moist.  Eyes: EOM are normal. Pupils are equal, round, and reactive to light.  Neck: Neck supple. JVD present.  Cardiovascular:  Irregularly irregular. Monitor shows atrial fibrillation rate of 95. Cannot appreciate gross rub murmur gallop. 1+ radial pulses.  Pulmonary/Chest:  Mild increased work of breathing. Decreased breath sounds bilateral bases. Occasional expiratory wheeze.  Abdominal: Soft. She exhibits no distension. There is no tenderness. There is no guarding.  Musculoskeletal:  2+ pitting edema bilateral lower extremities. No cellulitis. Skin condition good and intact.  Neurological:  Patient is oriented 3. She follows commands appropriately. She has no pronator drift. Grip strength is symmetric. She can elevate each extremity off of the bed independently and hold. Although speech sounds just slightly slurred. There does not seem to be any aphasia either receptive or expressive.  Skin: Skin is warm and dry.  Psychiatric: She has a normal mood and affect.     ED Treatments / Results  Labs (all labs ordered are listed, but only abnormal results are displayed) Labs Reviewed  COMPREHENSIVE METABOLIC PANEL - Abnormal; Notable for the following:       Result Value   Potassium 5.3 (*)    Glucose, Bld 115 (*)    BUN 80 (*)    Creatinine, Ser 4.35 (*)    Calcium 8.8 (*)    Total Protein 6.3 (*)    Albumin 3.2 (*)    AST 12 (*)    Total Bilirubin 1.3 (*)    GFR calc non Af Amer 10 (*)    GFR calc Af Amer 11 (*)    All other components within normal limits  BRAIN NATRIURETIC PEPTIDE - Abnormal; Notable for the following:    B Natriuretic Peptide 2,712.1 (*)    All other components within normal limits  CBC WITH DIFFERENTIAL/PLATELET - Abnormal; Notable for the following:    RDW 16.4 (*)    Platelets 137 (*)    Lymphs Abs 0.6 (*)    All other components within normal  limits  PROTIME-INR - Abnormal; Notable for the following:    Prothrombin Time 46.4 (*)    INR 4.81 (*)    All other components within normal limits  CBG MONITORING, ED - Abnormal; Notable for the following:    Glucose-Capillary 116 (*)    All other components within normal limits  I-STAT ARTERIAL BLOOD GAS, ED - Abnormal; Notable for the following:    pH, Arterial 7.252 (*)    pCO2 arterial 64.2 (*)    pO2, Arterial 74.0 (*)    Bicarbonate 28.3 (*)    All other components within normal limits  URINALYSIS, ROUTINE W REFLEX MICROSCOPIC  BLOOD GAS, ARTERIAL  TROPONIN I  TROPONIN I  HIV ANTIBODY (ROUTINE TESTING)  SODIUM, URINE, RANDOM  CREATININE, URINE, RANDOM  I-STAT CG4 LACTIC ACID, ED  Randolm Idol, ED  Randolm Idol, ED    EKG  EKG Interpretation  Date/Time:  Wednesday April 29 2017 09:17:47 EDT Ventricular Rate:  99 PR Interval:    QRS Duration: 132 QT Interval:  388 QTC Calculation: 498 R Axis:   160 Text Interpretation:  Atrial fibrillation Right bundle branch block Confirmed by Charlesetta Shanks [phone]) on 04/29/2017 9:23:56 AM       Radiology Ct Head Wo Contrast  Result Date: 04/29/2017 CLINICAL DATA:  Worsening slurred speech beginning 3 days ago. Left leg weakness over the last week. EXAM: CT HEAD WITHOUT CONTRAST TECHNIQUE: Contiguous axial images were obtained from the base of the skull through the vertex without intravenous contrast. COMPARISON:  CT and MRI studies 11/20/2016 FINDINGS: Brain: Left superior cerebellar infarction which was acute in December has progressed to atrophy/ encephalomalacia up. Old small right posterior parietal cortical infarction is unchanged. No sign of acute or subacute infarction, mass lesion, hemorrhage, hydrocephalus or extra-axial collection. Vascular: There is atherosclerotic calcification of the major vessels at the base of the brain. Skull: Negative Sinuses/Orbits: Clear/normal Other: None significant IMPRESSION: No acute  finding by CT. 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 Old right posterior parietal cortical infarction. Electronically Signed   By: Nelson Chimes M.D.   On: 04/29/2017 11:07   Dg Chest Port 1 View  Result Date: 04/29/2017 CLINICAL DATA:  Slurred speech, weakness EXAM: PORTABLE CHEST 1 VIEW COMPARISON:  11/21/2016 FINDINGS: Cardiomegaly with vascular congestion. No confluent opacity, effusion or edema. No acute bony abnormality. IMPRESSION: Cardiomegaly, vascular congestion. Electronically Signed   By: Rolm Baptise M.D.   On: 04/29/2017 10:01    Procedures Procedures (including critical care time) CRITICAL CARE Performed by: Charlesetta Shanks   Total critical care time: 45 minutes  Critical care time was exclusive of separately billable procedures and treating other patients.  Critical care was necessary to treat or prevent imminent or life-threatening deterioration.  Critical care was time spent personally by me on the following activities: development of treatment plan with patient and/or surrogate as well as nursing, discussions with consultants, evaluation of patient's response to treatment, examination of patient, obtaining history from patient or surrogate, ordering and performing treatments and interventions, ordering and review of laboratory studies, ordering and review of radiographic studies, pulse oximetry and re-evaluation of patient's condition. Medications Ordered in ED Medications  carvedilol (COREG) tablet 6.25 mg (not administered)  rosuvastatin (CRESTOR) tablet 10 mg (not administered)  sodium chloride flush (NS) 0.9 % injection 3 mL (not administered)  sodium chloride flush (NS) 0.9 % injection 3 mL (not administered)  0.9 %  sodium chloride infusion (not administered)  acetaminophen (TYLENOL) tablet 650 mg (not administered)  ondansetron (ZOFRAN) injection 4 mg (not administered)     Initial Impression / Assessment and Plan /  ED Course  I have  reviewed the triage vital signs and the nursing notes.  Pertinent labs & imaging results that were available during my care of the patient were reviewed by me and considered in my medical decision making (see chart for details).     Consult 10:34 Dr. Moody Bruins Nephrology. Do not give LASIX until seen by cardiology. Consult: (10:42) cardiology will call back shortly to discuss initiating inotrope. Consult: 10:48 intensivist Dr. Halford Chessman. Review with cardiology to determine admission plan and recontact. Consult: Dr. Tamala Julian cardiology 11:20 reviewed the case. At this time he advises he will consult in the emergency department . No specific intervention advised at this time. Final Clinical Impressions(s) / ED Diagnoses   Final diagnoses:  Generalized weakness  Hypotension, unspecified hypotension type  Nonischemic cardiomyopathy ([place])  Acute on chronic combined systolic and diastolic congestive heart failure [facility])   Patient presented with a chiefly reported complaint of slurred speech and weakness. After evaluation it appears this is associated with a congestive heart failure and hypoperfusion rather than CVA type symptoms. Patient's blood pressures for EMS where the systolic range of 16S to 90, administered 200 mL normal saline. In the emergency department this was found to be consistent. I suspect that this is most likely due to heart failure, patient's most recent echo 5\2 showed an EF of 15% and global hypokinesis. Despite this, the patient maintained clear mental status with perceptibly mild increased work of breathing with tachypnea but not showing impending failure/collapse. To temporize, I had the patient placed on BiPAP. I made multiple consultations concerning initiation of inotropic medications or other medication administration: per nephrology are at this time contingent on cardiology decision making, cardiology advisory would see the patient in the emergency department before  determining course of action. I continued to monitor the patient and she has remained stable on the BiPAP without decompensating further.  Patient has been seen by Dr. Tamala Julian of cardiology (15:25) and he feels that she has decompensated since his first evaluation. He has concern for CO2 narcosis despite BiPAP administration. His concern is that findings are primarily respiratory rather than cardiac decompensation. He does request intensivist to be consult it for management. I have reassessed the patient at this time vital signs are BP 92/82: Heart rate of 88 atrial fibrillation: 100% on BiPAP. Patient awakens to light stimulus and answers name, place and month correctly. She drowsy. Will order repeat ABG and consultation to intensivist.  Consult: Dr.Sommer (16:35) he will have the patient evaluated in the emergency department. We have reviewed history of present illness, medical record and current diagnostic findings. At this time, suspects significant component of hepatorenal failure from hypoperfusion due to cardiomyopathy and failure. We reviewed ABGs and patient's bipap status. At this time does not advise for immediate intubation based on current findings. Vital signs remain unchanged with heart rate mid 80s atrial fibrillation: BP 115/96: Oxygen saturation 100%. Patient is tolerating BiPAP. She is oriented to person and place and has purposeful activities. New Prescriptions New Prescriptions   No medications on file     Charlesetta Shanks, MD 04/29/17 [place], [place], MD 04/29/17 [phone]

## 2017-04-29 NOTE — Consult Note (Signed)
[facility] Consultation Note Requesting Physician:  EDP Reason for Consult:  AKI on CKD3  HPI: 66 yo female. PMH nonobstructive CAD, ischemic CCM, s/dCHF (EF 20-25%), severe R CHF, atrial fib with failed DC cardioversions (was being teed up for ablation). Presented to the ED with worsening edema, SOB, noted to be hypotensive in the 70's. ;ethargic, labs with creatinine of 4.35 BUN 80, K 5.3 (creatinine was 1.6 on 03/25/17, usual baseline 1.1-1.3 past 6 months). Noted respiratory acidosis (pCO2 64) placed on BIPAP. We are asked to see for her AKI on CKD. She was recently (5/3) changed from losartan to [facility] (last dose yesterday) Naprosyn is also on her med list and she nods "yes" that she was taking. Beyond that, d/t BIPAP and pt lethargy could not get additional history.   Date/Time Value  04/29/2017 09:34 AM 4.35 (H)  03/25/2017 12:30 PM 1.60 (H)  12/04/2016 03:03 PM 1.17  12/01/2016 11:59 AM 1.13 (H)  11/25/2016 11:22 AM 1.26 (H)  11/24/2016 05:19 AM 1.06 (H)  11/23/2016 04:48 AM 1.01 (H)  11/22/2016 03:26 AM 1.10 (H)  11/21/2016 05:41 AM 1.00  11/20/2016 05:27 AM 1.14 (H)  11/13/2016 04:13 PM 1.21 (H)  11/06/2016 02:59 PM 1.29 (H)  11/04/2016 08:26 AM 1.16 (H)  11/03/2016 05:01 AM 1.05 (H)  11/02/2016 02:52 AM 1.18 (H)  11/01/2016 03:33 PM 1.08 (H)  11/01/2016 04:21 AM 1.22 (H)  10/31/2016 06:32 PM 1.26 (H)  10/31/2016 03:56 PM 1.14 (H)  10/24/2016 02:24 AM 1.08 (H)    Past Medical History:  Diagnosis Date  . Arthritis   . CAD in native artery, s/p cardiac cath with non obstructive CAD 10/24/2016  . CHF (congestive heart failure) ([place])   . DIVERTICULITIS, HX OF 07/25/2007  . DIVERTICULOSIS, COLON 07/22/2007  . Dyspnea   . Edema, peripheral    a. chronic BLE edema, R>L. Prior trauma from dog attack and accident.  Marland Kitchen HYPERLIPIDEMIA 02/03/2008  . Hypersomnia    declines w/u  . Hypertension   . MENOPAUSAL DISORDER 01/09/2011  . Morbid obesity ([place]) 07/22/2007  . NICM (nonischemic  cardiomyopathy) ([place]) 10/24/2016  . Raynaud's syndrome 07/22/2007  . Stroke ([place]) 2017  . THYROID NODULE, RIGHT 01/04/2010  . VITAMIN D DEFICIENCY 01/09/2011   Qualifier: Diagnosis of  By: [name], [name]      Past Surgical History:  Procedure Laterality Date  . CARDIAC CATHETERIZATION N/A 10/23/2016   Procedure: Left Heart Cath and Coronary Angiography;  Surgeon: [name], MD;  Location: [place] [facility];  Service: Cardiovascular;  Laterality: N/A;  . CARDIOVERSION N/A 10/31/2016   Procedure: CARDIOVERSION;  Surgeon: [name], MD;  Location: [facility] ENDOSCOPY;  Service: Cardiovascular;  Laterality: N/A;  . CARDIOVERSION N/A 11/03/2016   Procedure: CARDIOVERSION;  Surgeon: [name], MD;  Location: [facility] ENDOSCOPY;  Service: Cardiovascular;  Laterality: N/A;  . CARDIOVERSION N/A 11/18/2016   Procedure: CARDIOVERSION;  Surgeon: [name], MD;  Location: [facility] ENDOSCOPY;  Service: Cardiovascular;  Laterality: N/A;  . CARDIOVERSION N/A 03/25/2017   Procedure: CARDIOVERSION;  Surgeon: [name], MD;  Location: [facility] ENDOSCOPY;  Service: Cardiovascular;  Laterality: N/A;  . CHOLECYSTECTOMY    . COLONOSCOPY    . PARTIAL HYSTERECTOMY     1 OVARY LEFT  . POLYPECTOMY    . TEE WITHOUT CARDIOVERSION N/A 10/31/2016   Procedure: TRANSESOPHAGEAL ECHOCARDIOGRAM (TEE);  Surgeon: [name], MD;  Location: [place];  Service: Cardiovascular;  Laterality: N/A;  . TEE WITHOUT CARDIOVERSION N/A 03/25/2017  Procedure: TRANSESOPHAGEAL ECHOCARDIOGRAM (TEE);  Surgeon: [name], MD;  Location: [facility] ENDOSCOPY;  Service: Cardiovascular;  Laterality: N/A;     Family History  Problem Relation Age of Onset  . Asthma Mother   . Diabetes Father   . Heart disease Father        Died of presumed heart attack - 42s  . Lung disease Sister   . Heart disease Sister        Twin sister has heart issue, unclear what kind  . Thyroid disease Neg Hx    Social History:  reports that she has  been smoking Cigarettes.  She has been smoking about 0.50 packs per day. She has never used smokeless tobacco. She reports that she drinks about 2.4 oz of alcohol per week . She reports that she does not use drugs.    Allergies  Allergen Reactions  . Ace Inhibitors Palpitations    Home medications: Prior to Admission medications   Medication Sig Start Date End Date Taking? Authorizing Provider  amiodarone (PACERONE) 200 MG tablet TAKE ONE TABLET TWICE DAILY X 2 WEEKS THEN DECREASE TO ONE TABLET DAILY Patient taking differently: Take 200 mg by mouth daily. TAKE ONE TABLET TWICE DAILY X 2 WEEKS THEN DECREASE TO ONE TABLET DAILY 03/19/17  Yes [name], MD  carvedilol (COREG) 6.25 MG tablet Take 1 tablet (6.25 mg total) by mouth 2 (two) times daily. 03/26/17  Yes [name], MD  furosemide (LASIX) 40 MG tablet Take 1.5 tablets (60 mg total) by mouth 2 (two) times daily. Patient taking differently: Take 80 mg by mouth 2 (two) times daily.  02/25/17  Yes Biagio Borg, MD  naproxen sodium (ANAPROX) 220 MG tablet Take 220 mg by mouth daily as needed (pain).   Yes [provider]  potassium chloride SA (KLOR-CON M20) 20 MEQ tablet Take 1 tablet (20 mEq total) by mouth 2 (two) times daily. 03/26/17  Yes [name], MD  rosuvastatin (CRESTOR) 10 MG tablet Take 1 tablet (10 mg total) by mouth daily. 12/04/16  Yes Biagio Borg, MD  warfarin (COUMADIN) 3 MG tablet Take 1 tablet by mouth daily or as directed by coumadin clinic Patient taking differently: Take 3 mg by mouth daily.  04/16/17  Yes [name], MD  acetaminophen (TYLENOL) 325 MG tablet Take 650 mg by mouth every 6 (six) hours as needed (pain).     [provider]  diclofenac sodium (VOLTAREN) 1 % GEL Apply 4 g topically 4 (four) times daily as needed (As directed for skin).    [provider]  sacubitril-valsartan (ENTRESTO) 49-51 MG Take 1 tablet by mouth 2 (two) times daily. Patient not taking:  Reported on 04/29/2017 04/06/17   [name], MD    Inpatient medications: . carvedilol  6.25 mg Oral BID  . rosuvastatin  10 mg Oral Daily  . sodium chloride flush  3 mL Intravenous Q12H    Review of Systems See HPI    Physical Exam:   BP 93/82   Pulse 76   Temp 98 F (36.7 C)   Resp 14   Ht 5\' 5"  (1.651 m)   Wt 130.6 kg (288 lb)   SpO2 95%   BMI 47.93 kg/m   Gen: Lethargic, on BIPAP with VS as noted No rash, cyanosis JVD to jaw angle Distant heart sounds In AFib rate 70-90 S1S2 No S3  Abdomen obese, non tender Ext: with 3+ pitting edema Neuro: Lethargic,  unable to accurately assess   Recent Labs Lab 04/29/17 0934  NA 138  K 5.3*  CL 103  CO2 24  GLUCOSE 115*  BUN 80*  CREATININE 4.35*  CALCIUM 8.8*    Recent Labs Lab 04/29/17 0934  AST 12*  ALT 16  ALKPHOS 85  BILITOT 1.3*  PROT 6.3*  ALBUMIN 3.2*    Recent Labs Lab 04/29/17 0934  WBC 7.6  NEUTROABS 6.2  HGB 13.8  HCT 44.3  MCV 89.7  PLT 137*   ABG    Component Value Date/Time   PHART 7.252 (L) 04/29/2017 1330   PCO2ART 64.2 (H) 04/29/2017 1330   PO2ART 74.0 (L) 04/29/2017 1330   HCO3 28.3 (H) 04/29/2017 1330   TCO2 30 04/29/2017 1330   ACIDBASEDEF 1.0 04/29/2017 1330   O2SAT 91.0 04/29/2017 1330   Lab Results  Component Value Date   INR 4.81 (HH) 04/29/2017   INR 5.3 04/16/2017   INR 5.9 (H) 04/06/2017    Xrays/Other Studies: Ct Head Wo Contrast  Result Date: 04/29/2017 CLINICAL DATA:  Worsening slurred speech beginning 3 days ago. Left leg weakness over the last week. EXAM: CT HEAD WITHOUT CONTRAST TECHNIQUE: Contiguous axial images were obtained from the base of the skull through the vertex without intravenous contrast. COMPARISON:  CT and MRI studies 11/20/2016 FINDINGS: Brain: Left superior cerebellar infarction which was acute in December has progressed to atrophy/ encephalomalacia up. Old small right posterior parietal cortical infarction is unchanged. No sign of  acute or subacute infarction, mass lesion, hemorrhage, hydrocephalus or extra-axial collection. Vascular: There is atherosclerotic calcification of the major vessels at the base of the brain. Skull: Negative Sinuses/Orbits: Clear/normal Other: None significant IMPRESSION: No acute finding by CT. 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 Old right posterior parietal cortical infarction. Electronically Signed   By: Nelson Chimes M.D.   On: 04/29/2017 11:07   Dg Chest Port 1 View  Result Date: 04/29/2017 CLINICAL DATA:  Slurred speech, weakness EXAM: PORTABLE CHEST 1 VIEW COMPARISON:  11/21/2016 FINDINGS: Cardiomegaly with vascular congestion. No confluent opacity, effusion or edema. No acute bony abnormality. IMPRESSION: Cardiomegaly, vascular congestion. Electronically Signed   By: Rolm Baptise M.D.   On: 04/29/2017 10:01    Background: PMH nonobstructive CAD, ischemic CCM, s/d CHF (EF 20-25%), severe R HF, atrial fib with failed DC cardioversions (was being teed up for ablation). Presented to the ED with worsening edema, SOB, noted to be hypotensive in the 70's, lethargic, labs with creatinine of 4.35 BUN 80, K 5.3 (creatinine was 1.6 on 03/25/17, usual baseline 1.1-1.3 past 6 months). Noted respiratory acidosis (pCO2 64) placed on BIPAP. PTA was taking Entresto and naprosyn. We are asked to see for AKI on CKD3  Assessment/Recommendations  1. AKI - hemodynamically driven - hypotension/R and left heart failure, Entresto and NSAIDS on board. Hypotension limiting ability to adequately rx her HF. Need foley for UOP monitoring. May end up needed renal replacement Rx unless BP/renal perfusion issue can be turned around. No indication just yet. Send urine lytes. Renal US 2. Cardiogenic shock - hold all BP lowering meds. CCM to address need for pressor support. 3. Hyperkalemia - mild - K restrict diet - Low dose of Kayexalate if awake enough to swallow 4. Acute on chronic  hypercarbic resp failure - pCO2 64/on BIPAP. Initially some improvement in BP to low 100's, now back into 90's and very lethargic. Am told CCM to see. May well require intubation  5. Mixed metabolic and resp acidosis - pCO2 64 (hope improved w/BIPAP needs repeat ABG); usual bicarb 30-33 - now down to 24 - 2/2 renal failure/hypoperfusion  6. Ischemic CM with s/dCHF (EF 20-25%) -  7. Atrial fib appears rate controlled at this time. 8. Supra therapeutic INR - holding coumadin  Jamal Maes,  MD [facility] [phone] pager 04/29/2017, 3:06 PM

## 2017-04-29 NOTE — Progress Notes (Signed)
CRITICAL VALUE ALERT  Critical Value:  Troponin 0.05  Date & Time Notied: 04/29/2017 at [place]  Provider Notified: Tylene Fantasia  Orders Received/Actions taken:

## 2017-04-29 NOTE — H&P (Addendum)
The patient has been seen in conjunction with Patricia Wagner, PAC. All aspects of care have been considered and discussed. The patient has been personally interviewed, examined, and all clinical data has been reviewed.   Acute on chronic hypercapnic respiratory failure with PCO2 64 / PO2 74 / pH 7.252. There is also a relative metabolic acidosis as bicarbonate is now 24 and is ordinarily in the mid 30s. Recommend critical care consultation for management of respiratory failure.  Hypotension is related to a combination of factors including severe pulmonary hypertension with acute on chronic cor pulmonale, acidosis, hypercarbia, and systolic heart failure. We will hold all medications that can cause hypotension. May need Neo-Synephrine or norepinephrine drip to improve systolic blood pressure, which initially improved with BiPAP but has now worsened.  Chronic atrial fibrillation with controlled rate currently.  Known chronic systolic heart failure with EF less than 20%  Acute kidney failure likely multifactorial related to hypotension, Entresto, diuresis, and other.   Chronic cor pulmonale, rule out obstructive sleep apnea when clinical situation improves.  Once acute respiratory failure is managed, we will get our advanced heart failure team involved.  She will need a repeat echocardiogram.  Overall prognosis is guarded at this time.  Time spent 55 minutes.  CARDIOLOGY CONSULTATION    Patient ID: Patricia Wagner MRN: [id number], DOB: 1950/12/04 Date of Encounter: 04/29/2017, 1:34 PM Primary Physician: Patricia Borg, MD Primary Cardiologist: Dr. Stanford Wagner  Chief Complaint: weakness and shortness of breath Reason for Admission: acute heart failure Requesting MD: Dr. Johnney Wagner  HPI: Patricia Wagner is a 66 y.o. female with history of  Non obstructive CAD, ischemic cardiomyopathy, mildly elevated left ventricular filling pressures (cath 84/1660), systolic and diastolic Heart failure (EF 20-25%)  10/2016, hyperlipidemia, hypertension, morbid obesity, stroke, persistent atrial fibrillation/flutter on coumadin.   Patient with new onset afib in November of 2017, she underwent TEE guided cardioversion 10/31/16. Atrial fibrillation recurred and she was seen by Dr. Rayann Wagner and placed on sotalol (pt concerned about cost of Tikosyn). Repeat DCCV 11/03/16.Patient seen in follow-up and noted to be in atrial flutter. She underwent repeat cardioversion on 11/18/2016. Patient was then found to have an acute nonhemorrhagic left superior cerebellar infarct. CTA showed occlusion of the left superior cerebellar artery. She was bradycardic and sotalol discontinued. DOAC dc'ed and pt placed on coumadin. Noted to have recurrent atrial fibrillation in follow-up office visit with atrial fibrillation clinic January 8. It was felt that she should continue on Coumadin with therapeutic INR for at least 4 weeks and then cardioversion could be considered. Dr. Lovena Wagner saw patient and felt she would likely require amiodarone to maintain sinus rhythm which was added. Patient had TEE guided cardioversion on May 2. She was found to have severe LV dysfunction, severe RV dysfunction, mild to moderate mitral regurgitation and moderate tricuspid regurgitation. She underwent cardioversion 4 times. The last 3 times showed sinus rhythm for 2-3 beats and then atrial fibrillation recurred. Since last seen, patient has some dyspnea on exertion but no orthopnea or PND. She continues with pedal edema. No chest pain, palpitations or syncope. Last seen in the office on Apr 09, 2017.   She is on Lasix 60 mg BID at home As of 5/21 Dr. Rayann Wagner was will to consider ablation for persistent Afib Already scheduled to see HF team Dr. Haroldine Wagner on 6/13.   Daughter reports over the past week she has been becoming increasingly fatigues and short of breath with significant Wagner edema. This morning she  had respiratory distress and EMS was called. SHe was noted to  be hypotensive systolic low 63'W. Was given a 200 mL bolus. Eventually she was started in BiPap in the ED and this helped her fatigue, respiratory distress and her BP improved.  An ABG  was done and shows partially compensated primary respiratory acidosis, likely superimposed on chronic primary respiratory acidosis. Chest xray with cardiomegaly, vascular congestion. Acute rise in creatinine from 1 month ago 1.6 << 4.35.   Head CT showed old infarct, no acute findings. Blood pressure is currently improving > 466 systolic and patient is more comfortable.   Past Medical History:  Diagnosis Date  . Arthritis   . CAD in native artery, s/p cardiac cath with non obstructive CAD 10/24/2016  . CHF (congestive heart failure) ([place])   . DIVERTICULITIS, HX OF 07/25/2007  . DIVERTICULOSIS, COLON 07/22/2007  . Dyspnea   . Edema, peripheral    a. chronic BLE edema, R>L. Prior trauma from dog attack and accident.  Marland Kitchen HYPERLIPIDEMIA 02/03/2008  . Hypersomnia    declines w/u  . Hypertension   . MENOPAUSAL DISORDER 01/09/2011  . Morbid obesity ([place]) 07/22/2007  . NICM (nonischemic cardiomyopathy) ([place]) 10/24/2016  . Raynaud's syndrome 07/22/2007  . Stroke ([place]) 2017  . THYROID NODULE, RIGHT 01/04/2010  . VITAMIN D DEFICIENCY 01/09/2011   Qualifier: Diagnosis of  By: [name], [name]      Surgical History:  Past Surgical History:  Procedure Laterality Date  . CARDIAC CATHETERIZATION N/A 10/23/2016   Procedure: Left Heart Cath and Coronary Angiography;  Surgeon: [name], MD;  Location: [facility];  Service: Cardiovascular;  Laterality: N/A;  . CARDIOVERSION N/A 10/31/2016   Procedure: CARDIOVERSION;  Surgeon: [name], MD;  Location: [facility] ENDOSCOPY;  Service: Cardiovascular;  Laterality: N/A;  . CARDIOVERSION N/A 11/03/2016   Procedure: CARDIOVERSION;  Surgeon: [name], MD;  Location: [facility] ENDOSCOPY;  Service: Cardiovascular;  Laterality: N/A;  . CARDIOVERSION N/A 11/18/2016    Procedure: CARDIOVERSION;  Surgeon: [name], MD;  Location: [facility] ENDOSCOPY;  Service: Cardiovascular;  Laterality: N/A;  . CARDIOVERSION N/A 03/25/2017   Procedure: CARDIOVERSION;  Surgeon: [name], MD;  Location: [facility] ENDOSCOPY;  Service: Cardiovascular;  Laterality: N/A;  . CHOLECYSTECTOMY    . COLONOSCOPY    . PARTIAL HYSTERECTOMY     1 OVARY LEFT  . POLYPECTOMY    . TEE WITHOUT CARDIOVERSION N/A 10/31/2016   Procedure: TRANSESOPHAGEAL ECHOCARDIOGRAM (TEE);  Surgeon: [name], MD;  Location: [facility] ENDOSCOPY;  Service: Cardiovascular;  Laterality: N/A;  . TEE WITHOUT CARDIOVERSION N/A 03/25/2017   Procedure: TRANSESOPHAGEAL ECHOCARDIOGRAM (TEE);  Surgeon: [name], MD;  Location: [facility] ENDOSCOPY;  Service: Cardiovascular;  Laterality: N/A;     Home Meds: Prior to Admission medications   Medication Sig Start Date End Date Taking? Authorizing Provider  amiodarone (PACERONE) 200 MG tablet TAKE ONE TABLET TWICE DAILY X 2 WEEKS THEN DECREASE TO ONE TABLET DAILY Patient taking differently: Take 200 mg by mouth daily. TAKE ONE TABLET TWICE DAILY X 2 WEEKS THEN DECREASE TO ONE TABLET DAILY 03/19/17  Yes [name], MD  carvedilol (COREG) 6.25 MG tablet Take 1 tablet (6.25 mg total) by mouth 2 (two) times daily. 03/26/17  Yes [name], MD  furosemide (LASIX) 40 MG tablet Take 1.5 tablets (60 mg total) by mouth 2 (two) times daily. Patient taking differently: Take 80 mg by mouth 2 (two) times daily.  02/25/17  Yes  Patricia Borg, MD  naproxen sodium (ANAPROX) 220 MG tablet Take 220 mg by mouth daily as needed (pain).   Yes [provider]  potassium chloride SA (KLOR-CON M20) 20 MEQ tablet Take 1 tablet (20 mEq total) by mouth 2 (two) times daily. 03/26/17  Yes [name], MD  rosuvastatin (CRESTOR) 10 MG tablet Take 1 tablet (10 mg total) by mouth daily. 12/04/16  Yes Patricia Borg, MD  warfarin (COUMADIN) 3 MG tablet Take 1 tablet by mouth daily or as directed  by coumadin clinic Patient taking differently: Take 3 mg by mouth daily.  04/16/17  Yes [name], MD  acetaminophen (TYLENOL) 325 MG tablet Take 650 mg by mouth every 6 (six) hours as needed (pain).     [provider]  diclofenac sodium (VOLTAREN) 1 % GEL Apply 4 g topically 4 (four) times daily as needed (As directed for skin).    [provider]  sacubitril-valsartan (ENTRESTO) 49-51 MG Take 1 tablet by mouth 2 (two) times daily. Patient not taking: Reported on 04/29/2017 04/06/17   [name], MD    Allergies:  Allergies  Allergen Reactions  . Ace Inhibitors Palpitations    Social History   Social History  . Marital status: Single    Spouse name: N/A  . Number of children: 1  . Years of education: N/A   Occupational History  . Printing     Social History Main Topics  . Smoking status: Current Every Day Smoker    Packs/day: 0.50    Types: Cigarettes    Last attempt to quit: 10/23/2016  . Smokeless tobacco: Never Used     Comment: Smoked for 50 years  . Alcohol use 2.4 oz/week    4 Cans of beer per week     Comment: Intermittent  . Drug use: No  . Sexual activity: Not on file   Other Topics Concern  . Not on file   Social History Narrative  . No narrative on file     Family History  Problem Relation Age of Onset  . Asthma Mother   . Diabetes Father   . Heart disease Father        Died of presumed heart attack - 15s  . Lung disease Sister   . Heart disease Sister        Twin sister has heart issue, unclear what kind  . Thyroid disease Neg Hx     Review of System General: negative for chills, fever, night sweats or weight changes.  Cardiovascular: negative for chest pain,  paroxysmal nocturnal dyspnea, Dermatological: negative for rash Respiratory: negative for cough or wheezing Urologic: negative for hematuria Abdominal: negative for nausea, vomiting, diarrhea, bright red blood per rectum, melena, or  hematemesis Neurologic: negative for visual changes, syncope, or dizziness All other systems reviewed and are otherwise negative except as noted above.  Labs:  Lab Results  Component Value Date   WBC 7.6 04/29/2017   HGB 13.8 04/29/2017   HCT 44.3 04/29/2017   MCV 89.7 04/29/2017   PLT 137 (L) 04/29/2017    Recent Labs Lab 04/29/17 0934  NA 138  K 5.3*  CL 103  CO2 24  BUN 80*  CREATININE 4.35*  CALCIUM 8.8*  PROT 6.3*  BILITOT 1.3*  ALKPHOS 85  ALT 16  AST 12*  GLUCOSE 115*   No results for input(s): CKTOTAL, CKMB, TROPONINI in the last 72 hours. Lab Results  Component Value Date  CHOL 144 11/21/2016   HDL 43 11/21/2016   LDLCALC 89 11/21/2016   TRIG 60 11/21/2016   No results found for: DDIMER  Radiology/Studies:  Ct Head Wo Contrast  Result Date: 04/29/2017 CLINICAL DATA:  Worsening slurred speech beginning 3 days ago. Left leg weakness over the last week. EXAM: CT HEAD WITHOUT CONTRAST TECHNIQUE: Contiguous axial images were obtained from the base of the skull through the vertex without intravenous contrast. COMPARISON:  CT and MRI studies 11/20/2016 FINDINGS: Brain: Left superior cerebellar infarction which was acute in December has progressed to atrophy/ encephalomalacia up. Old small right posterior parietal cortical infarction is unchanged. No sign of acute or subacute infarction, mass lesion, hemorrhage, hydrocephalus or extra-axial collection. Vascular: There is atherosclerotic calcification of the major vessels at the base of the brain. Skull: Negative Sinuses/Orbits: Clear/normal Other: None significant IMPRESSION: No acute finding by CT. 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 Old right posterior parietal cortical infarction. Electronically Signed   By: Nelson Chimes M.D.   On: 04/29/2017 11:07   Dg Chest Port 1 View  Result Date: 04/29/2017 CLINICAL DATA:  Slurred speech, weakness EXAM: PORTABLE CHEST 1 VIEW  COMPARISON:  11/21/2016 FINDINGS: Cardiomegaly with vascular congestion. No confluent opacity, effusion or edema. No acute bony abnormality. IMPRESSION: Cardiomegaly, vascular congestion. Electronically Signed   By: Rolm Baptise M.D.   On: 04/29/2017 10:01   Wt Readings from Last 3 Encounters:  04/29/17 288 lb (130.6 kg)  04/09/17 288 lb (130.6 kg)  03/26/17 279 lb (126.6 kg)   EKG: HR 99, atrial fib RBBB  Physical Exam: Blood pressure 104/70, pulse 85, temperature 98 F (36.7 C), resp. rate 12, height 5\' 5"  (1.651 m), weight 288 lb (130.6 kg), SpO2 93 %. Body mass index is 47.93 kg/m. General: Well developed, well nourished, on BiPap Head: Normocephalic, atraumatic, sclera non-icteric, no xanthomas, nares are without discharge.  Neck: Negative for carotid bruits. + JVD  Lungs: crackles bilaterally Heart: RRR with S1 S2. No murmurs, rubs, or gallops appreciated. Abdomen: Soft, non-tender, non-distended with normoactive bowel sounds. No hepatomegaly. No rebound/guarding. No obvious abdominal masses. Msk:  Strength and tone appear normal for age. Extremities: No clubbing or cyanosis.Significant Wagner edema Neuro: Alert and oriented X 3. No focal deficit. No facial asymmetry. Moves all extremities spontaneously. Generalized fatigue Psych:  Responds to questions appropriately with a normal affect.    Assessment and Plan    1.Hypotension, multifactorial- related to hypercarbia respiratory failure (respiratory acidosis), acute renal failure, medications, and acidosis. Per the daughter, the patient had been fatigue and short of breath for the past week. She has been on Entresto since being diagnosed with severe HF last last year, EF 20-25% and diffuse hypokinesis.  -- Blood pressures 80 sysolic on arrival and improving with biPap.  -- Admit to stepdown, strict I/O's, daily weights -- Cycle troponins x 3  2. Hypercarbic respiratory failure - BP initially improving on BiPap. Need critical care  management.   3. Acute renal failure: I spoke with Nephrology, difficult to tell the cause of the patients renal failure initially but urine electrolytes have been ordered and nephrology will be following along. Inserted foley catheter per recommendation of Nephrology.  4. Acute on chronic systolic heart failure: See above, Pt had first visit with Dr. Sung Amabile pending for 6/13. We will talk with the Advanced Heart Failure team about seeing patient this admission. -- Low dose Coreg continued. -- Need to consider sleep apnea evaluation  outpatient after this admission. -- 60 mg Lasix PO BID is home dose (not ordered) -- BNP is 2,712.1 -- Weight at office visit on  [place]. Will order 5/17 (288) 5/03 (279) 1/26  (245)  5. Supratherapetutic INR: Coumadin on hold per pharmacy for INR of 4.81. -- ordered repeat PT/INR for am.  Will watch BP closely if it continues to improve and maintain stable will consider IV diuretics.    Signed, Linus Mako PA-C 04/29/2017, 1:34 PM

## 2017-04-29 NOTE — ED Notes (Signed)
ED Provider at bedside. 

## 2017-04-30 ENCOUNTER — Encounter: Payer: Self-pay | Admitting: Internal Medicine

## 2017-04-30 ENCOUNTER — Observation Stay (HOSPITAL_COMMUNITY): Payer: Managed Care, Other (non HMO)

## 2017-04-30 DIAGNOSIS — N170 Acute kidney failure with tubular necrosis: Secondary | ICD-10-CM | POA: Diagnosis not present

## 2017-04-30 DIAGNOSIS — J9621 Acute and chronic respiratory failure with hypoxia: Secondary | ICD-10-CM | POA: Diagnosis present

## 2017-04-30 DIAGNOSIS — I2781 Cor pulmonale (chronic): Secondary | ICD-10-CM | POA: Diagnosis present

## 2017-04-30 DIAGNOSIS — N183 Chronic kidney disease, stage 3 (moderate): Secondary | ICD-10-CM | POA: Diagnosis present

## 2017-04-30 DIAGNOSIS — E870 Hyperosmolality and hypernatremia: Secondary | ICD-10-CM | POA: Diagnosis not present

## 2017-04-30 DIAGNOSIS — I130 Hypertensive heart and chronic kidney disease with heart failure and stage 1 through stage 4 chronic kidney disease, or unspecified chronic kidney disease: Secondary | ICD-10-CM | POA: Diagnosis present

## 2017-04-30 DIAGNOSIS — I480 Paroxysmal atrial fibrillation: Secondary | ICD-10-CM | POA: Diagnosis not present

## 2017-04-30 DIAGNOSIS — I9589 Other hypotension: Secondary | ICD-10-CM

## 2017-04-30 DIAGNOSIS — Z6841 Body Mass Index (BMI) 40.0 and over, adult: Secondary | ICD-10-CM | POA: Diagnosis not present

## 2017-04-30 DIAGNOSIS — I255 Ischemic cardiomyopathy: Secondary | ICD-10-CM | POA: Diagnosis present

## 2017-04-30 DIAGNOSIS — M1990 Unspecified osteoarthritis, unspecified site: Secondary | ICD-10-CM | POA: Diagnosis present

## 2017-04-30 DIAGNOSIS — D689 Coagulation defect, unspecified: Secondary | ICD-10-CM | POA: Diagnosis not present

## 2017-04-30 DIAGNOSIS — I428 Other cardiomyopathies: Secondary | ICD-10-CM | POA: Diagnosis not present

## 2017-04-30 DIAGNOSIS — E785 Hyperlipidemia, unspecified: Secondary | ICD-10-CM | POA: Diagnosis present

## 2017-04-30 DIAGNOSIS — J9602 Acute respiratory failure with hypercapnia: Secondary | ICD-10-CM

## 2017-04-30 DIAGNOSIS — N185 Chronic kidney disease, stage 5: Secondary | ICD-10-CM

## 2017-04-30 DIAGNOSIS — I5021 Acute systolic (congestive) heart failure: Secondary | ICD-10-CM | POA: Diagnosis not present

## 2017-04-30 DIAGNOSIS — I481 Persistent atrial fibrillation: Secondary | ICD-10-CM | POA: Diagnosis present

## 2017-04-30 DIAGNOSIS — I5023 Acute on chronic systolic (congestive) heart failure: Secondary | ICD-10-CM | POA: Diagnosis not present

## 2017-04-30 DIAGNOSIS — J9622 Acute and chronic respiratory failure with hypercapnia: Secondary | ICD-10-CM | POA: Diagnosis present

## 2017-04-30 DIAGNOSIS — Z79899 Other long term (current) drug therapy: Secondary | ICD-10-CM | POA: Diagnosis not present

## 2017-04-30 DIAGNOSIS — I959 Hypotension, unspecified: Secondary | ICD-10-CM

## 2017-04-30 DIAGNOSIS — I509 Heart failure, unspecified: Secondary | ICD-10-CM

## 2017-04-30 DIAGNOSIS — I2510 Atherosclerotic heart disease of native coronary artery without angina pectoris: Secondary | ICD-10-CM | POA: Diagnosis present

## 2017-04-30 DIAGNOSIS — N182 Chronic kidney disease, stage 2 (mild): Secondary | ICD-10-CM | POA: Diagnosis not present

## 2017-04-30 DIAGNOSIS — Z7901 Long term (current) use of anticoagulants: Secondary | ICD-10-CM | POA: Diagnosis not present

## 2017-04-30 DIAGNOSIS — R570 Cardiogenic shock: Secondary | ICD-10-CM | POA: Diagnosis not present

## 2017-04-30 DIAGNOSIS — I5043 Acute on chronic combined systolic (congestive) and diastolic (congestive) heart failure: Secondary | ICD-10-CM

## 2017-04-30 DIAGNOSIS — I081 Rheumatic disorders of both mitral and tricuspid valves: Secondary | ICD-10-CM | POA: Diagnosis present

## 2017-04-30 DIAGNOSIS — G4733 Obstructive sleep apnea (adult) (pediatric): Secondary | ICD-10-CM | POA: Diagnosis present

## 2017-04-30 DIAGNOSIS — N179 Acute kidney failure, unspecified: Secondary | ICD-10-CM | POA: Diagnosis present

## 2017-04-30 DIAGNOSIS — J449 Chronic obstructive pulmonary disease, unspecified: Secondary | ICD-10-CM | POA: Diagnosis present

## 2017-04-30 DIAGNOSIS — N178 Other acute kidney failure: Secondary | ICD-10-CM

## 2017-04-30 DIAGNOSIS — R0602 Shortness of breath: Secondary | ICD-10-CM | POA: Diagnosis present

## 2017-04-30 DIAGNOSIS — J9601 Acute respiratory failure with hypoxia: Secondary | ICD-10-CM

## 2017-04-30 DIAGNOSIS — I7300 Raynaud's syndrome without gangrene: Secondary | ICD-10-CM | POA: Diagnosis present

## 2017-04-30 DIAGNOSIS — Z8673 Personal history of transient ischemic attack (TIA), and cerebral infarction without residual deficits: Secondary | ICD-10-CM | POA: Diagnosis not present

## 2017-04-30 HISTORY — DX: Acute respiratory failure with hypercapnia: J96.02

## 2017-04-30 HISTORY — DX: Hypotension, unspecified: I95.9

## 2017-04-30 LAB — CBC WITH DIFFERENTIAL/PLATELET
BASOS ABS: 0 10*3/uL (ref 0.0–0.1)
Basophils Relative: 0 %
EOS ABS: 0.2 10*3/uL (ref 0.0–0.7)
EOS PCT: 3 %
HCT: 41.5 % (ref 36.0–46.0)
Hemoglobin: 12.6 g/dL (ref 12.0–15.0)
Lymphocytes Relative: 14 %
Lymphs Abs: 0.9 10*3/uL (ref 0.7–4.0)
MCH: 27.3 pg (ref 26.0–34.0)
MCHC: 30.4 g/dL (ref 30.0–36.0)
MCV: 90 fL (ref 78.0–100.0)
MONO ABS: 0.9 10*3/uL (ref 0.1–1.0)
Monocytes Relative: 15 %
Neutro Abs: 4.3 10*3/uL (ref 1.7–7.7)
Neutrophils Relative %: 68 %
PLATELETS: 120 10*3/uL — AB (ref 150–400)
RBC: 4.61 MIL/uL (ref 3.87–5.11)
RDW: 16.3 % — AB (ref 11.5–15.5)
WBC: 6.4 10*3/uL (ref 4.0–10.5)

## 2017-04-30 LAB — RENAL FUNCTION PANEL
Albumin: 2.7 g/dL — ABNORMAL LOW (ref 3.5–5.0)
Anion gap: 11 (ref 5–15)
BUN: 76 mg/dL — AB (ref 6–20)
CALCIUM: 8.6 mg/dL — AB (ref 8.9–10.3)
CHLORIDE: 103 mmol/L (ref 101–111)
CO2: 28 mmol/L (ref 22–32)
CREATININE: 3.79 mg/dL — AB (ref 0.44–1.00)
GFR, EST AFRICAN AMERICAN: 14 mL/min — AB (ref >60)
GFR, EST NON AFRICAN AMERICAN: 12 mL/min — AB (ref >60)
Glucose, Bld: 70 mg/dL (ref 65–99)
Phosphorus: 5.5 mg/dL — ABNORMAL HIGH (ref 2.5–4.6)
Potassium: 4.7 mmol/L (ref 3.5–5.1)
SODIUM: 142 mmol/L (ref 135–145)

## 2017-04-30 LAB — GLUCOSE, CAPILLARY: GLUCOSE-CAPILLARY: 77 mg/dL (ref 65–99)

## 2017-04-30 LAB — PROTIME-INR
INR: 4.1 — AB
Prothrombin Time: 40.8 seconds — ABNORMAL HIGH (ref 11.4–15.2)

## 2017-04-30 LAB — HIV ANTIBODY (ROUTINE TESTING W REFLEX): HIV Screen 4th Generation wRfx: NONREACTIVE

## 2017-04-30 LAB — TROPONIN I
Troponin I: 0.05 ng/mL (ref <0.03)
Troponin I: 0.04 ng/mL (ref <0.03)

## 2017-04-30 MED ORDER — AMIODARONE HCL IN DEXTROSE 360-4.14 MG/200ML-% IV SOLN
30.0000 mg/h | INTRAVENOUS | Status: DC
  Administered 2017-04-30 – 2017-05-02 (×5): 30 mg/h via INTRAVENOUS
  Filled 2017-04-30 (×5): qty 200

## 2017-04-30 MED ORDER — SODIUM CHLORIDE 0.9% FLUSH
10.0000 mL | INTRAVENOUS | Status: DC | PRN
  Administered 2017-05-10: 10 mL
  Filled 2017-04-30: qty 40

## 2017-04-30 MED ORDER — AMIODARONE LOAD VIA INFUSION
150.0000 mg | Freq: Once | INTRAVENOUS | Status: AC
  Administered 2017-04-30: 150 mg via INTRAVENOUS
  Filled 2017-04-30: qty 83.34

## 2017-04-30 MED ORDER — ORAL CARE MOUTH RINSE: 15.0000 mL | Freq: Two times a day (BID) | OROMUCOSAL | Status: DC

## 2017-04-30 MED ORDER — SODIUM CHLORIDE 0.9 % IV SOLN: INTRAVENOUS | Status: DC

## 2017-04-30 MED ORDER — CHLORHEXIDINE GLUCONATE 0.12 % MT SOLN
15.0000 mL | Freq: Two times a day (BID) | OROMUCOSAL | Status: DC
Start: 2017-04-30 — End: 2017-05-02
  Administered 2017-04-30 – 2017-05-02 (×5): 15 mL via OROMUCOSAL
  Filled 2017-04-30 (×3): qty 15

## 2017-04-30 MED ORDER — DOBUTAMINE IN D5W 4-5 MG/ML-% IV SOLN
1.0000 ug/kg/min | INTRAVENOUS | Status: DC
  Administered 2017-04-30 – 2017-05-01 (×2): 2.5 ug/kg/min via INTRAVENOUS
  Administered 2017-05-03: 5 ug/kg/min via INTRAVENOUS
  Administered 2017-05-04 – 2017-05-06 (×2): 2.5 ug/kg/min via INTRAVENOUS
  Administered 2017-05-07: 1 ug/kg/min via INTRAVENOUS
  Filled 2017-04-30 (×7): qty 250

## 2017-04-30 MED ORDER — CARVEDILOL 3.125 MG PO TABS
3.1250 mg | ORAL_TABLET | Freq: Two times a day (BID) | ORAL | Status: DC
  Filled 2017-04-30: qty 1

## 2017-04-30 MED ORDER — SODIUM CHLORIDE 0.9% FLUSH
10.0000 mL | Freq: Two times a day (BID) | INTRAVENOUS | Status: DC
  Administered 2017-04-30 – 2017-05-06 (×11): 10 mL
  Administered 2017-05-07: 20 mL
  Administered 2017-05-07 – 2017-05-09 (×4): 10 mL
  Administered 2017-05-09: 20 mL
  Administered 2017-05-10: 10 mL
  Administered 2017-05-10: 20 mL
  Administered 2017-05-11 – 2017-05-13 (×5): 10 mL
  Administered 2017-05-14: 20 mL
  Administered 2017-05-15 – 2017-05-16 (×3): 10 mL

## 2017-04-30 MED ORDER — FUROSEMIDE 10 MG/ML IJ SOLN
80.0000 mg | Freq: Two times a day (BID) | INTRAMUSCULAR | Status: DC
  Administered 2017-04-30 – 2017-05-03 (×8): 80 mg via INTRAVENOUS
  Filled 2017-04-30 (×8): qty 8

## 2017-04-30 MED ORDER — AMIODARONE HCL IN DEXTROSE 360-4.14 MG/200ML-% IV SOLN
60.0000 mg/h | INTRAVENOUS | Status: AC
  Administered 2017-04-30 (×2): 60 mg/h via INTRAVENOUS
  Filled 2017-04-30 (×2): qty 200

## 2017-04-30 NOTE — Progress Notes (Signed)
Peripherally Inserted Central Catheter/Midline Placement  The IV Nurse has discussed with the patient and/or persons authorized to consent for the patient, the purpose of this procedure and the potential benefits and risks involved with this procedure.  The benefits include less needle sticks, lab draws from the catheter, and the patient may be discharged home with the catheter. Risks include, but not limited to, infection, bleeding, blood clot (thrombus formation), and puncture of an artery; nerve damage and irregular heartbeat and possibility to perform a PICC exchange if needed/ordered by physician.  Alternatives to this procedure were also discussed.  Bard Power PICC patient education guide, fact sheet on infection prevention and patient information card has been provided to patient /or left at bedside.    PICC/Midline Placement Documentation  PICC Triple Lumen 04/30/17 PICC Right Brachial 43 cm 0 cm (Active)  Indication for Insertion or Continuance of Line Vasoactive infusions 04/30/2017  5:00 PM  Exposed Catheter (cm) 0 cm 04/30/2017  5:00 PM  Dressing Change Due 05/07/17 04/30/2017  5:00 PM       Jule Economy Horton 04/30/2017, 5:12 PM

## 2017-04-30 NOTE — Consult Note (Signed)
Advanced Heart Failure Team Consult Note  Primary Cardiologist:  Dr. [name]  Reason for Consultation: Hypotension / Acute on chronic systolic CHF  HPI:    Patricia Wagner is seen today for evaluation of acute on chronic systolic CHF at the request of Dr. Tamala Julian.   Patricia Wagner is a 66 y.o. female with persistent atrial fibrillation, NICM cardiomyopathy, Biventricular CHF (LVEF 15% and RV severely reduced), HTN, and HLD.   Has struggled with symptomatic afib since 10/2016. Has successful DCCV 10/2016. Failed DCCV 03/2017, after stopping her amiodarone. Seen in A-Fib clinic 04/09/17 and planned for repeat DCCV after further amio load.   Pt admitted to [facility] 04/29/17 with worsening fatigue, SOB, and peripheral edema over past week.  Developed respiratory distress and called EMS. Noted to be hypotensive into 80s. BiPAP started in ED. ABG showed primary respiratory acidosis.   Pertinent admission labs include K 5.3, Creatinine 4.35, BNP 2712.1, Troponin flat with only mild elevation at 0.04, Lactic acid 1.39, WBC 7.6, Hgb 13.8, INR 4.8.  UA positive with many bacteria. CXR with cardiomegaly and vascular congestion. Nephrology consulted with marked AKI.  Feeling a little better today. Has felt poorly overall for past month or so dealing with Afib.  Snores, but no CPAP at home.  SOB with activity. Legs swollen and abdomen distended.  pH 7.25 pCO2 63.7 Bicarb 29.2  Echo TEE 03/25/17 LVEF 15%, RV mildly dilated with severely reduced function, Mild/Mod MR, Mod LAE, Mild RAE, Mod TR  Review of Systems: [y] = yes, [ ]  = no   General: Weight gain [y]; Weight loss [ ] ; Anorexia [ ] ; Fatigue [y]; Fever [ ] ; Chills [ ] ; Weakness [ ]   Cardiac: Chest pain/pressure [ ] ; Resting SOB [ ] ; Exertional SOB [y]; Orthopnea [y]; Pedal Edema [y]; Palpitations [ ] ; Syncope [ ] ; Presyncope [ ] ; Paroxysmal nocturnal dyspnea[ ]   Pulmonary: Cough [ ] ; Wheezing[ ] ; Hemoptysis[ ] ; Sputum [ ] ; Snoring [ ]   GI: Vomiting[ ] ;  Dysphagia[ ] ; Melena[ ] ; Hematochezia [ ] ; Heartburn[ ] ; Abdominal pain [ ] ; Constipation [ ] ; Diarrhea [ ] ; BRBPR [ ]   GU: Hematuria[ ] ; Dysuria [ ] ; Nocturia[ ]   Vascular: Pain in legs with walking [ ] ; Pain in feet with lying flat [ ] ; Non-healing sores [ ] ; Stroke [ ] ; TIA [ ] ; Slurred speech [ ] ;  Neuro: Headaches[ ] ; Vertigo[ ] ; Seizures[ ] ; Paresthesias[ ] ;Blurred vision [ ] ; Diplopia [ ] ; Vision changes [ ]   Ortho/Skin: Arthritis [y]; Joint pain [y]; Muscle pain [ ] ; Joint swelling [ ] ; Back Pain [ ] ; Rash [ ]   Psych: Depression[ ] ; Anxiety[ ]   Heme: Bleeding problems [ ] ; Clotting disorders [ ] ; Anemia [ ]   Endocrine: Diabetes [ ] ; Thyroid dysfunction[ ]   Home Medications Prior to Admission medications   Medication Sig Start Date End Date Taking? Authorizing Provider  amiodarone (PACERONE) 200 MG tablet TAKE ONE TABLET TWICE DAILY X 2 WEEKS THEN DECREASE TO ONE TABLET DAILY Patient taking differently: Take 200 mg by mouth daily. TAKE ONE TABLET TWICE DAILY X 2 WEEKS THEN DECREASE TO ONE TABLET DAILY 03/19/17  Yes [name], MD  carvedilol (COREG) 6.25 MG tablet Take 1 tablet (6.25 mg total) by mouth 2 (two) times daily. 03/26/17  Yes [name], MD  furosemide (LASIX) 40 MG tablet Take 1.5 tablets (60 mg total) by mouth 2 (two) times daily. Patient taking differently: Take 80 mg by mouth 2 (two) times daily.  02/25/17  Yes Biagio Borg, MD  naproxen sodium (ANAPROX) 220 MG tablet Take 220 mg by mouth daily as needed (pain).   Yes [provider]  potassium chloride SA (KLOR-CON M20) 20 MEQ tablet Take 1 tablet (20 mEq total) by mouth 2 (two) times daily. 03/26/17  Yes [name], MD  rosuvastatin (CRESTOR) 10 MG tablet Take 1 tablet (10 mg total) by mouth daily. 12/04/16  Yes Biagio Borg, MD  warfarin (COUMADIN) 3 MG tablet Take 1 tablet by mouth daily or as directed by coumadin clinic Patient taking differently: Take 3 mg by mouth daily.  04/16/17  Yes  [name], MD  acetaminophen (TYLENOL) 325 MG tablet Take 650 mg by mouth every 6 (six) hours as needed (pain).     [provider]  diclofenac sodium (VOLTAREN) 1 % GEL Apply 4 g topically 4 (four) times daily as needed (As directed for skin).    [provider]  sacubitril-valsartan (ENTRESTO) 49-51 MG Take 1 tablet by mouth 2 (two) times daily. Patient not taking: Reported on 04/29/2017 04/06/17   [name], MD    Past Medical History: Past Medical History:  Diagnosis Date  . Arthritis   . CAD in native artery, s/p cardiac cath with non obstructive CAD 10/24/2016  . CHF (congestive heart failure) ([place])   . DIVERTICULITIS, HX OF 07/25/2007  . DIVERTICULOSIS, COLON 07/22/2007  . Dyspnea   . Edema, peripheral    a. chronic BLE edema, R>L. Prior trauma from dog attack and accident.  Marland Kitchen HYPERLIPIDEMIA 02/03/2008  . Hypersomnia    declines w/u  . Hypertension   . MENOPAUSAL DISORDER 01/09/2011  . Morbid obesity ([place]) 07/22/2007  . NICM (nonischemic cardiomyopathy) ([place]) 10/24/2016  . Raynaud's syndrome 07/22/2007  . Stroke ([place]) 2017  . THYROID NODULE, RIGHT 01/04/2010  . VITAMIN D DEFICIENCY 01/09/2011   Qualifier: Diagnosis of  By: [name], [name]     Past Surgical History: Past Surgical History:  Procedure Laterality Date  . CARDIAC CATHETERIZATION N/A 10/23/2016   Procedure: Left Heart Cath and Coronary Angiography;  Surgeon: [name], MD;  Location: [facility];  Service: Cardiovascular;  Laterality: N/A;  . CARDIOVERSION N/A 10/31/2016   Procedure: CARDIOVERSION;  Surgeon: [name], MD;  Location: [facility] ENDOSCOPY;  Service: Cardiovascular;  Laterality: N/A;  . CARDIOVERSION N/A 11/03/2016   Procedure: CARDIOVERSION;  Surgeon: [name], MD;  Location: [facility] ENDOSCOPY;  Service: Cardiovascular;  Laterality: N/A;  . CARDIOVERSION N/A 11/18/2016   Procedure: CARDIOVERSION;  Surgeon: [name], MD;  Location: [facility] ENDOSCOPY;   Service: Cardiovascular;  Laterality: N/A;  . CARDIOVERSION N/A 03/25/2017   Procedure: CARDIOVERSION;  Surgeon: [name], MD;  Location: [facility] ENDOSCOPY;  Service: Cardiovascular;  Laterality: N/A;  . CHOLECYSTECTOMY    . COLONOSCOPY    . PARTIAL HYSTERECTOMY     1 OVARY LEFT  . POLYPECTOMY    . TEE WITHOUT CARDIOVERSION N/A 10/31/2016   Procedure: TRANSESOPHAGEAL ECHOCARDIOGRAM (TEE);  Surgeon: [name], MD;  Location: [facility] ENDOSCOPY;  Service: Cardiovascular;  Laterality: N/A;  . TEE WITHOUT CARDIOVERSION N/A 03/25/2017   Procedure: TRANSESOPHAGEAL ECHOCARDIOGRAM (TEE);  Surgeon: [name], MD;  Location: [facility] ENDOSCOPY;  Service: Cardiovascular;  Laterality: N/A;    Family History: Family History  Problem Relation Age of Onset  . Asthma Mother   . Diabetes Father   . Heart disease Father        Died of presumed  heart attack - 70s  . Lung disease Sister   . Heart disease Sister        Twin sister has heart issue, unclear what kind  . Thyroid disease Neg Hx     Social History: Social History   Social History  . Marital status: Single    Spouse name: N/A  . Number of children: 1  . Years of education: N/A   Occupational History  . Printing     Social History Main Topics  . Smoking status: Current Every Day Smoker    Packs/day: 0.50    Types: Cigarettes    Last attempt to quit: 10/23/2016  . Smokeless tobacco: Never Used     Comment: Smoked for 50 years  . Alcohol use 2.4 oz/week    4 Cans of beer per week     Comment: Intermittent  . Drug use: No  . Sexual activity: Not Asked   Other Topics Concern  . None   Social History Narrative  . None   Allergies:  Allergies  Allergen Reactions  . Ace Inhibitors Palpitations   Objective:    Vital Signs:   Temp:  [97.8 F (36.6 C)-98.9 F (37.2 C)] 98.5 F (36.9 C) (06/07 0800) Pulse Rate:  [54-93] 84 (06/07 0800) Resp:  [11-26] 23 (06/07 0800) BP: (80-152)/(52-128) 85/59 (06/07 0800) SpO2:  [85  %-99 %] 90 % (06/07 0810) FiO2 (%):  [30 %-40 %] 30 % (06/07 0810) Weight:  [272 lb 14.4 oz (123.8 kg)-289 lb (131.1 kg)] 289 lb (131.1 kg) (06/07 0300) Last BM Date: 04/27/17  Weight change: Filed Weights   04/29/17 0912 04/29/17 1800 04/30/17 0300  Weight: 288 lb (130.6 kg) 272 lb 14.4 oz (123.8 kg) 289 lb (131.1 kg)   Intake/Output:   Intake/Output Summary (Last 24 hours) at 04/30/17 0957 Last data filed at 04/30/17 0800  Gross per 24 hour  Intake              206 ml  Output             1275 ml  Net            -1069 ml    Physical Exam: General: Chronically ill and fatigued.  HEENT: normal on CPAP Neck: supple. JVP to jaw. Carotids 2+ bilat; no bruits. No lymphadenopathy or thyromegaly appreciated. Cor: PMI nondisplaced. Irregularly irregular.  Lungs: Diminished Abdomen: Obese, soft, nontender, nondistended. No hepatosplenomegaly. No bruits or masses. Good bowel sounds. Extremities: no cyanosis, clubbing, or rash. 2-3+ peripheral edema.  Neuro: alert & orientedx3, cranial nerves grossly intact. moves all 4 extremities w/o difficulty. Affect pleasant  Telemetry: Personally reviewed, Coarse Afib in 80-90s  Labs: Basic Metabolic Panel:  Recent Labs Lab 04/29/17 0934 04/29/17 1822 04/30/17 0532  NA 138  --  142  K 5.3* 5.1 4.7  CL 103  --  103  CO2 24  --  28  GLUCOSE 115*  --  70  BUN 80*  --  76*  CREATININE 4.35*  --  3.79*  CALCIUM 8.8*  --  8.6*  PHOS  --   --  5.5*    Liver Function Tests:  Recent Labs Lab 04/29/17 0934 04/30/17 0532  AST 12*  --   ALT 16  --   ALKPHOS 85  --   BILITOT 1.3*  --   PROT 6.3*  --   ALBUMIN 3.2* 2.7*   No results for input(s): LIPASE, AMYLASE in the last 168 hours. No results  for input(s): AMMONIA in the last 168 hours.  CBC:  Recent Labs Lab 04/29/17 0934 04/30/17 0532  WBC 7.6 6.4  NEUTROABS 6.2 4.3  HGB 13.8 12.6  HCT 44.3 41.5  MCV 89.7 90.0  PLT 137* 120*   Cardiac Enzymes:  Recent Labs Lab  04/29/17 1822 04/30/17 0022 04/30/17 0532  TROPONINI 0.05* 0.05* 0.04*    BNP: BNP (last 3 results)  Recent Labs  10/31/16 1246 11/20/16 1121 04/29/17 0935  BNP 1,218.8* 1,555.6* 2,712.1*    ProBNP (last 3 results) No results for input(s): PROBNP in the last 8760 hours.   CBG:  Recent Labs Lab 04/29/17 0931 04/29/17 2146  GLUCAP 116* 72    Coagulation Studies:  Recent Labs  04/29/17 0934 04/30/17 0532  LABPROT 46.4* 40.8*  INR 4.81* 4.10*    Other results: EKG: 04/29/17 Afib 99 bpm  Imaging: Ct Head Wo Contrast  Result Date: 04/29/2017 CLINICAL DATA:  Worsening slurred speech beginning 3 days ago. Left leg weakness over the last week. EXAM: CT HEAD WITHOUT CONTRAST TECHNIQUE: Contiguous axial images were obtained from the base of the skull through the vertex without intravenous contrast. COMPARISON:  CT and MRI studies 11/20/2016 FINDINGS: Brain: Left superior cerebellar infarction which was acute in December has progressed to atrophy/ encephalomalacia up. Old small right posterior parietal cortical infarction is unchanged. No sign of acute or subacute infarction, mass lesion, hemorrhage, hydrocephalus or extra-axial collection. Vascular: There is atherosclerotic calcification of the major vessels at the base of the brain. Skull: Negative Sinuses/Orbits: Clear/normal Other: None significant IMPRESSION: No acute finding by CT. 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 Old right posterior parietal cortical infarction. Electronically Signed   By: Nelson Chimes M.D.   On: 04/29/2017 11:07   US Renal  Result Date: 04/29/2017 CLINICAL DATA:  Acute kidney injury EXAM: RENAL / URINARY TRACT ULTRASOUND COMPLETE COMPARISON:  None. FINDINGS: Examination limited by patient related factors . Right Kidney: Length: 10.0 cm. Echogenicity within normal limits. No mass or hydronephrosis visualized. Left Kidney: Length: 9.9 cm. Echogenicity within  normal limits. No mass or hydronephrosis visualized. Bladder: Appears normal for degree of bladder distention. IMPRESSION: Unremarkable limited ultrasound of the kidneys and bladder, with no hydronephrosis. Electronically Signed   By: Ilona Sorrel M.D.   On: 04/29/2017 17:08   Dg Chest Port 1 View  Result Date: 04/30/2017 CLINICAL DATA:  Breath EXAM: PORTABLE CHEST 1 VIEW COMPARISON:  04/29/2017 FINDINGS: Cardiomegaly with vascular congestion. No confluent airspace opacities, effusions or overt edema. No acute bony abnormality. IMPRESSION: Cardiomegaly, vascular congestion. Electronically Signed   By: Rolm Baptise M.D.   On: 04/30/2017 08:15      Medications:     Current Medications: . carvedilol  3.125 mg Oral BID  . chlorhexidine  15 mL Mouth Rinse BID  . ipratropium  0.5 mg Nebulization Q6H  . mouth rinse  15 mL Mouth Rinse q12n4p  . rosuvastatin  10 mg Oral Daily  . sodium chloride flush  3 mL Intravenous Q12H     Infusions: . sodium chloride    . sodium chloride        Assessment/Plan   1. Acute on chronic biventricular CHF - TEE 03/25/17 with LVEF 15% and severely reduced RV function - Markedly volume overloaded on exam. NYHA IIIb-IV symptoms.  - Will place PICC for dobutamine at 2.5 mcg/kg/min.  Suspect she may need for home.  Will use to augment diuresis.  Will follow CVP.  -  Will start lasix 80 mg IV BID this afternoon once on pressor support - Stop coreg with suspected low output.  2. Persistent Afib - Failed multiple DC-CV. Last attempt for DCCV 03/25/17 but had stopped her amiodarone - Likely contributor to her cardiomyopathy.  - Bolus and load IV amiodarone. Will likely need to repeat DCCV prior to d/c.  3. Acute hypercapnic respiratory failure - With respiratory acidosis.  Continue CPAP. - Likely needs sleep study. + Snoring and body habitus pre-disposes for OSA/OHS. 4. Hypotension - Adding dobutamine as above.  Can consider levophed as needed.  5. ARF on CKD  II-III - Baseline creatinine ~ 1.5-1.6 over past few months - Up to 4.35 on admission.  Suspect ATN with hypotension.  - Suspect will be able to avoid dialysis with pressor support.  - Continue to follow closely.   Length of Stay: 0  Annamaria Helling  04/30/2017, 9:57 AM  Advanced Heart Failure Team Pager [phone] (M-F; 7a - 4p)  Please contact [facility] for night-coverage after hours (4p -7a ) and weekends on [email or web address]  Patient seen and examined with the above-signed Advanced Practice Provider and/or Housestaff. I personally reviewed laboratory data, imaging studies and relevant notes. I independently examined the patient and formulated the important aspects of the plan. I have edited the note to reflect any of my changes or salient points. I have personally discussed the plan with the patient and/or family.  Echo images and chart all reviewed personally.   She has severe NICM likely related to AF. Unfortunately has failed multiple attempts at [facility]. Amio recently added but load was insufficient before most recent DC-CV attempt.   Now admitted with hypercarbic respiratory failure, AKI and hypotension.  Suspect low output HF is main issue with resultant fluid overload (weight up about 15 pounds), respiratory failure and AKI related to hypotension.  Plan: 1. Stop b-blocker 2. Start IV dobutamine for inotropic support 3. Start IV amio 4. Once dobutamine on board begin IV diuresis 5. Place PICC for administration of dobutamine and amio 6. Continue BIPAP for support 7. Once more stable and loaded with amio with repeat DC-CV 8. Suspect she will need home inotropes for a short period of time  D/w Dr. Lorrene Reid in Nephrology at bedside.   Glori Bickers, MD  11:23 AM

## 2017-04-30 NOTE — Progress Notes (Addendum)
[facility] Rounding Note  Subjective/Interval History:  Looks much better, more alert Remains on BIPAP BP soft but improved To start inotropic support Making urine, creatinine down some  Objective Vital signs in last 24 hours: Vitals:   04/30/17 0300 04/30/17 0413 04/30/17 0800 04/30/17 0810  BP: 99/69  (!) 85/59   Pulse: 90 87 84   Resp: 13 17 (!) 23   Temp: 97.8 F (36.6 C)  98.5 F (36.9 C)   TempSrc: Axillary  Axillary   SpO2: 92% 93% (!) 89% 90%  Weight: 131.1 kg (289 lb)     Height:       Weight change:   Intake/Output Summary (Last 24 hours) at 04/30/17 1033 Last data filed at 04/30/17 0800  Gross per 24 hour  Intake                6 ml  Output             1275 ml  Net            -1269 ml   Physical Exam:  Blood pressure (!) 85/59, pulse 84, temperature 98.5 F (36.9 C), temperature source Axillary, resp. rate (!) 23, height 5\' 5"  (1.651 m), weight 131.1 kg (289 lb), SpO2 90 %.  Awake, alert, wearing BIPAP  Oriented Denies pain or SOB Generalized anasarca with pitting edema breasts/abd wall/LEedema Abdomen obese, no focal tenderness, + BS's 3+ LE edema to hips Clear dark yellow urine in foley   Recent Labs Lab 04/29/17 0934 04/29/17 1822 04/30/17 0532  NA 138  --  142  K 5.3* 5.1 4.7  CL 103  --  103  CO2 24  --  28  GLUCOSE 115*  --  70  BUN 80*  --  76*  CREATININE 4.35*  --  3.79*  CALCIUM 8.8*  --  8.6*  PHOS  --   --  5.5*     Recent Labs Lab 04/29/17 0934 04/30/17 0532  AST 12*  --   ALT 16  --   ALKPHOS 85  --   BILITOT 1.3*  --   PROT 6.3*  --   ALBUMIN 3.2* 2.7*     Recent Labs Lab 04/29/17 0934 04/30/17 0532  WBC 7.6 6.4  NEUTROABS 6.2 4.3  HGB 13.8 12.6  HCT 44.3 41.5  MCV 89.7 90.0  PLT 137* 120*    Recent Labs Lab 04/29/17 1822 04/30/17 0022 04/30/17 0532  TROPONINI 0.05* 0.05* 0.04*     Recent Labs Lab 04/29/17 0931 04/29/17 2146  GLUCAP 116* 72   ABG    Component Value Date/Time   PHART 7.269 (L)  04/29/2017 1622   PCO2ART 63.7 (H) 04/29/2017 1622   PO2ART 106.0 04/29/2017 1622   HCO3 29.2 (H) 04/29/2017 1622   TCO2 31 04/29/2017 1622   ACIDBASEDEF 1.0 04/29/2017 1330   O2SAT 97.0 04/29/2017 1622   Results for Patricia Wagner, Patricia Wagner (MRN [id number]) as of 04/30/2017 10:47  Ref. Range 04/29/2017 19:07  Appearance Latest Ref Range: CLEAR  CLOUDY (A)  Bacteria, UA Latest Ref Range: NONE SEEN  MANY (A)  Bilirubin Urine Latest Ref Range: NEGATIVE  NEGATIVE  Color, Urine Latest Ref Range: YELLOW  AMBER (A)  Glucose Latest Ref Range: NEGATIVE mg/dL NEGATIVE  Hgb urine dipstick Latest Ref Range: NEGATIVE  NEGATIVE  Hyaline Casts, UA Unknown PRESENT  Ketones, ur Latest Ref Range: NEGATIVE mg/dL NEGATIVE  Leukocytes, UA Latest Ref Range: NEGATIVE  NEGATIVE  Nitrite Latest Ref Range: NEGATIVE  POSITIVE (A)  pH Latest Ref Range: 5.0 - 8.0  6.0  Protein Latest Ref Range: NEGATIVE mg/dL 100 (A)  RBC / HPF Latest Ref Range: 0 - 5 RBC/hpf 0-5  Specific Gravity, Urine Latest Ref Range: 1.005 - 1.030  1.014  Squamous Epithelial / LPF Latest Ref Range: NONE SEEN  0-5 (A)  WBC, UA Latest Ref Range: 0 - 5 WBC/hpf 6-30  Sodium, Urine Latest Units: mmol/L <10   Studies/Results: Ct Head Wo Contrast  Result Date: 04/29/2017 CLINICAL DATA:  Worsening slurred speech beginning 3 days ago. Left leg weakness over the last week. EXAM: CT HEAD WITHOUT CONTRAST TECHNIQUE: Contiguous axial images were obtained from the base of the skull through the vertex without intravenous contrast. COMPARISON:  CT and MRI studies 11/20/2016 FINDINGS: Brain: Left superior cerebellar infarction which was acute in December has progressed to atrophy/ encephalomalacia up. Old small right posterior parietal cortical infarction is unchanged. No sign of acute or subacute infarction, mass lesion, hemorrhage, hydrocephalus or extra-axial collection. Vascular: There is atherosclerotic calcification of the major vessels at the base of the brain. Skull:  Negative Sinuses/Orbits: Clear/normal Other: None significant IMPRESSION: No acute finding by CT. 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 Old right posterior parietal cortical infarction. Electronically Signed   By: Nelson Chimes M.D.   On: 04/29/2017 11:07   US Renal  Result Date: 04/29/2017 CLINICAL DATA:  Acute kidney injury EXAM: RENAL / URINARY TRACT ULTRASOUND COMPLETE COMPARISON:  None. FINDINGS: Examination limited by patient related factors . Right Kidney: Length: 10.0 cm. Echogenicity within normal limits. No mass or hydronephrosis visualized. Left Kidney: Length: 9.9 cm. Echogenicity within normal limits. No mass or hydronephrosis visualized. Bladder: Appears normal for degree of bladder distention. IMPRESSION: Unremarkable limited ultrasound of the kidneys and bladder, with no hydronephrosis. Electronically Signed   By: Ilona Sorrel M.D.   On: 04/29/2017 17:08   Dg Chest Port 1 View  Result Date: 04/30/2017 CLINICAL DATA:  Breath EXAM: PORTABLE CHEST 1 VIEW COMPARISON:  04/29/2017 FINDINGS: Cardiomegaly with vascular congestion. No confluent airspace opacities, effusions or overt edema. No acute bony abnormality. IMPRESSION: Cardiomegaly, vascular congestion. Electronically Signed   By: Rolm Baptise M.D.   On: 04/30/2017 08:15   Dg Chest Port 1 View  Result Date: 04/29/2017 CLINICAL DATA:  Slurred speech, weakness EXAM: PORTABLE CHEST 1 VIEW COMPARISON:  11/21/2016 FINDINGS: Cardiomegaly with vascular congestion. No confluent opacity, effusion or edema. No acute bony abnormality. IMPRESSION: Cardiomegaly, vascular congestion. Electronically Signed   By: Rolm Baptise M.D.   On: 04/29/2017 10:01   TEE 03/25/17 Study Conclusions  - Left ventricle: Systolic function was severely reduced. The   estimated ejection fraction was 15%. Diffuse hypokinesis. - Aortic valve: No evidence of vegetation. - Mitral valve: No evidence of vegetation. There was mild  to   moderate regurgitation. - Left atrium: The atrium was moderately dilated. No evidence of   thrombus in the atrial cavity or appendage. - Right ventricle: The cavity size was mildly dilated. Systolic   function was severely reduced. - Right atrium: The atrium was mildly dilated. - Atrial septum: No defect or patent foramen ovale was identified. - Tricuspid valve: No evidence of vegetation. There was moderate   regurgitation. - Pulmonic valve: No evidence of vegetation. - Pericardium, extracardiac: A trivial pericardial effusion was   identified.  Impressions:  - Severe LV dysfunction; mild to moderate MR; moderate LAE; no LAA   thrombus; mild RAE  and RVE; severely reduced RV function;   moderate TR.   Medications: . sodium chloride    . amiodarone     Followed by  . amiodarone    . DOBUTamine     . amiodarone  150 mg Intravenous Once  . chlorhexidine  15 mL Mouth Rinse BID  . furosemide  80 mg Intravenous BID  . ipratropium  0.5 mg Nebulization Q6H  . mouth rinse  15 mL Mouth Rinse q12n4p  . rosuvastatin  10 mg Oral Daily  . sodium chloride flush  3 mL Intravenous Q12H   Background: PMH nonobstructive CAD, ischemic CCM, s/d CHF (EF 20-25%), severe R HF, atrial fib with failed DC cardioversions (was being teed up for ablation). Presented to the ED with worsening edema, SOB, noted to be hypotensive in the 70's, lethargic, labs with creatinine of 4.35 BUN 80, K 5.3 (creatinine was 1.6 on 03/25/17, usual baseline 1.1-1.3 past 6 months). Noted respiratory acidosis (pCO2 64) placed on BIPAP. PTA was taking Entresto and naprosyn. We were asked to see for AKI on CKD3.  Assessment/Recommendations  1. AKI on CKD3- hemodynamically driven - hypotension/R and left heart failure, Entresto and NSAIDS on board PTA. FeNa <1% supports dx. Making urine now, creatinine improved some, kidneys 10, 9.9 no hydro, K has corrected w/kayexalate, no indication for CRRT at this time. Continue to  trend labs. Expect further improvement as hemodynamics improve 2. Cardiogenic shock - starting dobutamine. Recent TEE 5/2 shows severe R/L HF w/EF 15%. Dr. B anticipates need for home inotropes. Cards also managing diuretics. 3. Hyperkalemia - resolved post Kayexalate 4. Acute on chronic hypercarbic resp failure - pCO2 64 on admission/on BIPAP. Has excaped intubation 5. Mixed metabolic and resp acidosis - pCO2 64 and usual venous CO2 30-33 - now in 20's. 2/2 renal failure/hypoperfusion  6. Ischemic CM with s/dCHF (EF by TEE  7. Atrial fib appears rate controlled at this time. 8. Supra therapeutic INR - holding coumadin 9. Baseline CKD3 - proteinuria by dipstick 100 mg% new this admit. Repeat UA in a couple of days, UPC if still proteinuria  No renal interventions needed at this time  Jamal Maes, MD [place] [phone] pager 04/30/2017, 10:33 AM

## 2017-04-30 NOTE — Care Management Note (Addendum)
Case Management Note  Patient Details  Name: Patricia Wagner MRN: [id number] Date of Birth: May 25, 1951  Subjective/Objective:   Pt admitted for SOB, hypotensive, AKI  Action/Plan:   PTA from home - pt is on BIPAP >24 hours.  Pt was independent from home per daughter.  Possible discharge with inotropes - starting dobutamine.  [facility] notified of possible home IV infusion referral.  PT eval will be ordered when pt is medically stable.   Expected Discharge Date:  05/04/17               Expected Discharge Plan:  [place]  In-House Referral:     Discharge planning Services     Post Acute Care Choice:    Choice offered to:     DME Arranged:    DME Agency:     HH Arranged:    HH Agency:     Status of Service:     If discussed at [employer], dates discussed:    Additional Comments:  [name], RN 04/30/2017, 1:37 PM

## 2017-04-30 NOTE — Progress Notes (Signed)
Progress Note  Patient Name: Patricia Wagner Date of Encounter: 04/30/2017  Primary Cardiologist: Kirk Ruths  Subjective   No chest discomfort. Feels more alert this morning. Has been on BiPAP throughout the night.  Inpatient Medications    Scheduled Meds: . carvedilol  6.25 mg Oral BID  . ipratropium  0.5 mg Nebulization Q6H  . rosuvastatin  10 mg Oral Daily  . sodium chloride flush  3 mL Intravenous Q12H   Continuous Infusions: . sodium chloride     PRN Meds: sodium chloride, acetaminophen, ondansetron (ZOFRAN) IV, sodium chloride flush   Vital Signs    Vitals:   04/30/17 0156 04/30/17 0300 04/30/17 0413 04/30/17 0810  BP:  99/69    Pulse:  90 87   Resp:  13 17   Temp:  97.8 F (36.6 C)    TempSrc:  Axillary    SpO2: 93% 92% 93% 90%  Weight:  289 lb (131.1 kg)    Height:        Intake/Output Summary (Last 24 hours) at 04/30/17 0819 Last data filed at 04/30/17 0405  Gross per 24 hour  Intake              206 ml  Output              925 ml  Net             -719 ml   Filed Weights   04/29/17 0912 04/29/17 1800 04/30/17 0300  Weight: 288 lb (130.6 kg) 272 lb 14.4 oz (123.8 kg) 289 lb (131.1 kg)    Telemetry    Atrial fibrillation with controlled ventricular response - Personally Reviewed  ECG    Atrial fibrillation, controlled ventricular response, nonspecific interventricular conduction delay on 04/29/17 - Personally Reviewed  Physical Exam  Morbidly obese GEN: No acute distress.   Neck: Marked JVD Cardiac: IIRR, no murmurs, rubs, or gallops. Cardiac tones are distant Respiratory: Clear to auscultation bilaterally. GI: Soft, nontender, non-distended  MS:  2+ lower extremity edema; No deformity. Neuro:   level of consciousness is markedly improved compared to yesterday. Psych: Normal affect   Labs    Chemistry Recent Labs Lab 04/29/17 0934 04/29/17 1822 04/30/17 0532  NA 138  --  142  K 5.3* 5.1 4.7  CL 103  --  103  CO2 24  --  28    GLUCOSE 115*  --  70  BUN 80*  --  76*  CREATININE 4.35*  --  3.79*  CALCIUM 8.8*  --  8.6*  PROT 6.3*  --   --   ALBUMIN 3.2*  --  2.7*  AST 12*  --   --   ALT 16  --   --   ALKPHOS 85  --   --   BILITOT 1.3*  --   --   GFRNONAA 10*  --  12*  GFRAA 11*  --  14*  ANIONGAP 11  --  11     Hematology Recent Labs Lab 04/29/17 0934 04/30/17 0532  WBC 7.6 6.4  RBC 4.94 4.61  HGB 13.8 12.6  HCT 44.3 41.5  MCV 89.7 90.0  MCH 27.9 27.3  MCHC 31.2 30.4  RDW 16.4* 16.3*  PLT 137* 120*    Cardiac Enzymes Recent Labs Lab 04/29/17 1822 04/30/17 0022 04/30/17 0532  TROPONINI 0.05* 0.05* 0.04*    Recent Labs Lab 04/29/17 0941 04/29/17 1406  TROPIPOC 0.03 0.04     BNP Recent Labs Lab  04/29/17 0935  BNP 2,712.1*     DDimer No results for input(s): DDIMER in the last 168 hours.   Radiology    Ct Head Wo Contrast  Result Date: 04/29/2017 CLINICAL DATA:  Worsening slurred speech beginning 3 days ago. Left leg weakness over the last week. EXAM: CT HEAD WITHOUT CONTRAST TECHNIQUE: Contiguous axial images were obtained from the base of the skull through the vertex without intravenous contrast. COMPARISON:  CT and MRI studies 11/20/2016 FINDINGS: Brain: Left superior cerebellar infarction which was acute in December has progressed to atrophy/ encephalomalacia up. Old small right posterior parietal cortical infarction is unchanged. No sign of acute or subacute infarction, mass lesion, hemorrhage, hydrocephalus or extra-axial collection. Vascular: There is atherosclerotic calcification of the major vessels at the base of the brain. Skull: Negative Sinuses/Orbits: Clear/normal Other: None significant IMPRESSION: No acute finding by CT. 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 Old right posterior parietal cortical infarction. Electronically Signed   By: Nelson Chimes M.D.   On: 04/29/2017 11:07   US Renal  Result Date: 04/29/2017 CLINICAL  DATA:  Acute kidney injury EXAM: RENAL / URINARY TRACT ULTRASOUND COMPLETE COMPARISON:  None. FINDINGS: Examination limited by patient related factors . Right Kidney: Length: 10.0 cm. Echogenicity within normal limits. No mass or hydronephrosis visualized. Left Kidney: Length: 9.9 cm. Echogenicity within normal limits. No mass or hydronephrosis visualized. Bladder: Appears normal for degree of bladder distention. IMPRESSION: Unremarkable limited ultrasound of the kidneys and bladder, with no hydronephrosis. Electronically Signed   By: Ilona Sorrel M.D.   On: 04/29/2017 17:08   Dg Chest Port 1 View  Result Date: 04/30/2017 CLINICAL DATA:  Breath EXAM: PORTABLE CHEST 1 VIEW COMPARISON:  04/29/2017 FINDINGS: Cardiomegaly with vascular congestion. No confluent airspace opacities, effusions or overt edema. No acute bony abnormality. IMPRESSION: Cardiomegaly, vascular congestion. Electronically Signed   By: Rolm Baptise M.D.   On: 04/30/2017 08:15   Dg Chest Port 1 View  Result Date: 04/29/2017 CLINICAL DATA:  Slurred speech, weakness EXAM: PORTABLE CHEST 1 VIEW COMPARISON:  11/21/2016 FINDINGS: Cardiomegaly with vascular congestion. No confluent opacity, effusion or edema. No acute bony abnormality. IMPRESSION: Cardiomegaly, vascular congestion. Electronically Signed   By: Rolm Baptise M.D.   On: 04/29/2017 10:01    Cardiac Studies   Echocardiogram performed 11/20/2016: Study Conclusions  - Left ventricle: The cavity size was mildly to moderately dilated.   Wall thickness was increased in a pattern of mild LVH. Systolic   function was severely reduced. The estimated ejection fraction   was in the range of 20% to 25%. Diffuse hypokinesis.   Indeterminant diastolic function (atrial fibrillation). - Aortic valve: There was no stenosis. - Mitral valve: Mildly calcified annulus. There was trivial   regurgitation. - Left atrium: The atrium was severely dilated. - Right ventricle: The cavity size was  normal. Systolic function   was moderately reduced. - Right atrium: The atrium was moderately dilated. - Pulmonary arteries: No complete TR doppler jet so unable to   estimate PA systolic pressure. - Systemic veins: IVC measured 1.9 cm with < 50% respirophasic   variation, suggesting RA pressure 8 mmHg. - Pericardium, extracardiac: A trivial pericardial effusion was   identified posterior to the heart.  Impressions:  - The patient was in atrial fibrillation. Mild to moderate LV   dilation with mild LV hypertrophy. EF 20-25%, diffuse   hypokinesis. Normal RV size with moderately decreased systolic   function. Biatrial enlargement.  Patient Profile     66 y.o. female with history of  Non obstructive CAD, ischemic cardiomyopathy, systolic and diastolic Heart failure (EF 20-25%) 10/2016 with elevated EDP and widely patent coronaries by cath, hyperlipidemia, hypertension, morbid obesity, snoring and probable undiagnosed sleep apnea, stroke, persistent atrial fibrillation/flutter with multiple failed cardioversions on coumadin, admitted with acute kidney injury, hypotension, and somnolence due to acute on chronic hypercapnic respiratory failure.  Assessment & Plan    1. Hypotension is still a concern with systolic blood pressures in the 90 mmHg range. Have not started pressor therapy to avoid renal vasoconstriction. As long as she is mentating okay we will except the current blood pressures, unless others have different opinions. Hypotension is multifactorial. Certainly biventricular heart failure is playing a role. An option would be to start either dobutamine or milrinone. Decrease carvedilol to 3.125 mg twice a day 2. Acute on chronic right heart failure with severe pulmonary hypertension, likely multifactorial related to undiagnosed sleep apnea and chronic lung disease. 3. Acute kidney failure due to hypotension,Entresto, and diuresis. Diuretic therapy and Entresto have been held. Kidney  function has slightly improved overnight. I will start very low flow hydration with normal saline for approximately 12 hours. 4. Acute on chronic hypercapnic respiratory failure with mixed respiratory and metabolic acidosis being managed by critical care medicine and nephrology respectively 5. Acute on chronic systolic heart failure with previously documented LVEF less than 25%. Pulmonary congestion is present on admitting chest x-ray. Unable to diurese currently due to kidney failure and hypotension. I have asked the advanced heart failure team to give an opinion and perhaps assume care if reasonable. 6. Chronic atrial fibrillation with good rate control. I will decrease carvedilol dose to 3.125 mg twice a day  Overall plan is gentle hydration with normal saline, decrease carvedilol dose to 3.125 mg twice a day, obtain consultation from advanced heart failure team, and await improvement in respiratory failure. She was certainly need to have evaluation for obstructive sleep apnea.  Signed, Sinclair Grooms, MD  04/30/2017, 8:19 AM

## 2017-05-01 ENCOUNTER — Encounter (HOSPITAL_COMMUNITY): Payer: Self-pay | Admitting: Nurse Practitioner

## 2017-05-01 LAB — RENAL FUNCTION PANEL
Albumin: 2.9 g/dL — ABNORMAL LOW (ref 3.5–5.0)
Anion gap: 10 (ref 5–15)
BUN: 59 mg/dL — AB (ref 6–20)
CALCIUM: 8.6 mg/dL — AB (ref 8.9–10.3)
CHLORIDE: 99 mmol/L — AB (ref 101–111)
CO2: 35 mmol/L — AB (ref 22–32)
CREATININE: 2.93 mg/dL — AB (ref 0.44–1.00)
GFR calc non Af Amer: 16 mL/min — ABNORMAL LOW (ref >60)
GFR, EST AFRICAN AMERICAN: 19 mL/min — AB (ref >60)
GLUCOSE: 69 mg/dL (ref 65–99)
Phosphorus: 4.3 mg/dL (ref 2.5–4.6)
Potassium: 3.9 mmol/L (ref 3.5–5.1)
SODIUM: 144 mmol/L (ref 135–145)

## 2017-05-01 LAB — PROTEIN / CREATININE RATIO, URINE
CREATININE, URINE: 25.8 mg/dL
PROTEIN CREATININE RATIO: 0.31 mg/mg{creat} — AB (ref 0.00–0.15)
Total Protein, Urine: 8 mg/dL

## 2017-05-01 LAB — BASIC METABOLIC PANEL
ANION GAP: 9 (ref 5–15)
BUN: 54 mg/dL — AB (ref 6–20)
CHLORIDE: 101 mmol/L (ref 101–111)
CO2: 34 mmol/L — AB (ref 22–32)
Calcium: 8.7 mg/dL — ABNORMAL LOW (ref 8.9–10.3)
Creatinine, Ser: 2.6 mg/dL — ABNORMAL HIGH (ref 0.44–1.00)
GFR calc Af Amer: 21 mL/min — ABNORMAL LOW (ref >60)
GFR, EST NON AFRICAN AMERICAN: 18 mL/min — AB (ref >60)
GLUCOSE: 97 mg/dL (ref 65–99)
POTASSIUM: 3.4 mmol/L — AB (ref 3.5–5.1)
Sodium: 144 mmol/L (ref 135–145)

## 2017-05-01 LAB — URINALYSIS, ROUTINE W REFLEX MICROSCOPIC
Bilirubin Urine: NEGATIVE
GLUCOSE, UA: NEGATIVE mg/dL
KETONES UR: NEGATIVE mg/dL
Leukocytes, UA: NEGATIVE
Nitrite: NEGATIVE
PH: 5 (ref 5.0–8.0)
PROTEIN: NEGATIVE mg/dL
Specific Gravity, Urine: 1.006 (ref 1.005–1.030)

## 2017-05-01 LAB — COOXEMETRY PANEL
CARBOXYHEMOGLOBIN: 1.4 % (ref 0.5–1.5)
METHEMOGLOBIN: 0.9 % (ref 0.0–1.5)
O2 Saturation: 63.8 %
Total hemoglobin: 14 g/dL (ref 12.0–16.0)

## 2017-05-01 LAB — PROTIME-INR
INR: 3.22
Prothrombin Time: 33.6 seconds — ABNORMAL HIGH (ref 11.4–15.2)

## 2017-05-01 LAB — MAGNESIUM: Magnesium: 2 mg/dL (ref 1.7–2.4)

## 2017-05-01 MED ORDER — CHLORHEXIDINE GLUCONATE CLOTH 2 % EX PADS
6.0000 | MEDICATED_PAD | Freq: Every day | CUTANEOUS | Status: DC
  Administered 2017-05-02 – 2017-05-04 (×3): 6 via TOPICAL

## 2017-05-01 MED ORDER — WARFARIN SODIUM 2 MG PO TABS
1.0000 mg | ORAL_TABLET | Freq: Once | ORAL | Status: AC
  Administered 2017-05-01: 1 mg via ORAL
  Filled 2017-05-01: qty 0.5

## 2017-05-01 MED ORDER — POTASSIUM CHLORIDE CRYS ER 20 MEQ PO TBCR
20.0000 meq | EXTENDED_RELEASE_TABLET | Freq: Once | ORAL | Status: AC
Start: 2017-05-01 — End: 2017-05-01
  Administered 2017-05-01: 20 meq via ORAL
  Filled 2017-05-01: qty 1

## 2017-05-01 MED ORDER — WARFARIN - PHARMACIST DOSING INPATIENT
Freq: Every day | Status: DC
  Administered 2017-05-14: 17:00:00

## 2017-05-01 MED ORDER — ISOSORB DINITRATE-HYDRALAZINE 20-37.5 MG PO TABS
0.5000 | ORAL_TABLET | Freq: Three times a day (TID) | ORAL | Status: DC
  Administered 2017-05-01 – 2017-05-11 (×30): 0.5 via ORAL
  Filled 2017-05-01 (×31): qty 1

## 2017-05-01 MED ORDER — POTASSIUM CHLORIDE CRYS ER 20 MEQ PO TBCR
40.0000 meq | EXTENDED_RELEASE_TABLET | Freq: Once | ORAL | Status: AC
Start: 2017-05-01 — End: 2017-05-01
  Administered 2017-05-01: 40 meq via ORAL
  Filled 2017-05-01: qty 2

## 2017-05-01 NOTE — Progress Notes (Signed)
Advanced Heart Failure Rounding Note  Primary Cardiologist: Dr. [name]  Subjective:    Yesterday started on dobutamine and amiodarone gtt. Given IV lasix x 2.   Feeling much better. Hungry. Has been NPO.  Legs slightly sore.   Down 10 lbs. Negative 7.4 L with 8.4 L of UO.  CVP remains ~ 10 cm with respiratory variability.   Creatinine 3.79 -> 2.93. K 3.9. Got kayexelate 04/29/17.  Objective:   Weight Range: 279 lb (126.6 kg) Body mass index is 46.43 kg/m.   Vital Signs:   Temp:  [97.3 F (36.3 C)-98.6 F (37 C)] 97.3 F (36.3 C) (06/08 0412) Pulse Rate:  [67-102] 85 (06/08 0755) Resp:  [9-26] 19 (06/08 0755) BP: (91-127)/(55-96) 119/62 (06/08 0700) SpO2:  [88 %-95 %] 91 % (06/08 0755) FiO2 (%):  [30 %-40 %] 40 % (06/08 0700) Weight:  [279 lb (126.6 kg)] 279 lb (126.6 kg) (06/08 0412) Last BM Date: 04/27/17  Weight change: Filed Weights   04/29/17 1800 04/30/17 0300 05/01/17 0412  Weight: 272 lb 14.4 oz (123.8 kg) 289 lb (131.1 kg) 279 lb (126.6 kg)    Intake/Output:   Intake/Output Summary (Last 24 hours) at 05/01/17 0801 Last data filed at 05/01/17 0418  Gross per 24 hour  Intake           951.96 ml  Output             8100 ml  Net         -7148.04 ml     Physical Exam: General: Fatigued appearing.  Obese.  HEENT: Normal Neck: supple. JVP 10-11 cm. Carotids 2+ bilat; no bruits. No thyromegaly or nodule noted. Cor: PMI nondisplaced. RRR, No M/G/R noted Lungs: Diminished throughout.  Abdomen: soft, non-tender, distended, no HSM. No bruits or masses. +BS  Extremities: no cyanosis, clubbing, or rash. 2-3+ peripheral edema remains.  Neuro: alert & orientedx3, cranial nerves grossly intact. moves all 4 extremities w/o difficulty. Affect pleasant   Telemetry: Personally reviewed, coarse afib in 80-90s  Labs: CBC  Recent Labs  04/29/17 0934 04/30/17 0532  WBC 7.6 6.4  NEUTROABS 6.2 4.3  HGB 13.8 12.6  HCT 44.3 41.5  MCV 89.7 90.0  PLT 137* 120*    Basic Metabolic Panel  Recent Labs  04/30/17 0532 05/01/17 0404  NA 142 144  K 4.7 3.9  CL 103 99*  CO2 28 35*  GLUCOSE 70 69  BUN 76* 59*  CREATININE 3.79* 2.93*  CALCIUM 8.6* 8.6*  MG  --  2.0  PHOS 5.5* 4.3   Liver Function Tests  Recent Labs  04/29/17 0934 04/30/17 0532 05/01/17 0404  AST 12*  --   --   ALT 16  --   --   ALKPHOS 85  --   --   BILITOT 1.3*  --   --   PROT 6.3*  --   --   ALBUMIN 3.2* 2.7* 2.9*   No results for input(s): LIPASE, AMYLASE in the last 72 hours. Cardiac Enzymes  Recent Labs  04/29/17 1822 04/30/17 0022 04/30/17 0532  TROPONINI 0.05* 0.05* 0.04*    BNP: BNP (last 3 results)  Recent Labs  10/31/16 1246 11/20/16 1121 04/29/17 0935  BNP 1,218.8* 1,555.6* 2,712.1*    ProBNP (last 3 results) No results for input(s): PROBNP in the last 8760 hours.   D-Dimer No results for input(s): DDIMER in the last 72 hours. Hemoglobin A1C No results for input(s): HGBA1C in the last 72 hours.  Fasting Lipid Panel No results for input(s): CHOL, HDL, LDLCALC, TRIG, CHOLHDL, LDLDIRECT in the last 72 hours. Thyroid Function Tests No results for input(s): TSH, T4TOTAL, T3FREE, THYROIDAB in the last 72 hours.  Invalid input(s): FREET3  Other results:  Imaging/Studies:  Dg Chest Port 1 View  Result Date: 04/30/2017 CLINICAL DATA:  PICC line placement EXAM: PORTABLE CHEST 1 VIEW COMPARISON:  04/30/2017 FINDINGS: Stable cardiomegaly with aortic atherosclerosis. Right-sided PICC line tip is seen in the distal SVC. Mild interstitial edema persists. Increased retrocardiac opacity may be secondary to atelectasis, effusion or bibasilar pneumonia. IMPRESSION: 1. PICC line tip in the distal SVC. 2. Cardiomegaly with aortic atherosclerosis and mild interstitial edema. Retrocardiac opacities persist which may reflect atelectasis and/or pneumonia. A small left effusion is not entirely excluded. No significant change from prior. Electronically Signed    By: Ashley Royalty M.D.   On: 04/30/2017 17:46     Medications:     Scheduled Medications: . chlorhexidine  15 mL Mouth Rinse BID  . furosemide  80 mg Intravenous BID  . ipratropium  0.5 mg Nebulization Q6H  . mouth rinse  15 mL Mouth Rinse q12n4p  . rosuvastatin  10 mg Oral Daily  . sodium chloride flush  10-40 mL Intracatheter Q12H  . sodium chloride flush  3 mL Intravenous Q12H     Infusions: . sodium chloride    . amiodarone 30 mg/hr (05/01/17 0257)  . DOBUTamine 2.5 mcg/kg/min (05/01/17 0257)     PRN Medications:  sodium chloride, acetaminophen, ondansetron (ZOFRAN) IV, sodium chloride flush, sodium chloride flush   Assessment/Plan   1. Acute on chronic biventricular CHF.  - TEE 03/25/17 with LVEF 15% and severely reduced RV function - Marked diuresis noted. CVP ~10. Will continue lasix 80 mg IV BID today. If marked output this am, may need to hold evening dose.  - Continue dobutamine 2.5 mcg/kg/min for now.  Follow coox.  May need home inotrope.  - No BB with suspected low output. Responding very well  2. Persistent Afib - Failed multiple DC-CV. Last attempt for DCCV 03/25/17 but had stopped her amiodarone - Continue amio gtt.  Will need to consider DCCV prior to d/c once diuresed.  - Bolus and load IV amiodarone. Will likely need to repeat DCCV prior to d/c.  3. Acute hypercapnic respiratory failure - Continue CPAP with sleeping.  - Needs sleep study. + Snoring and body habitus pre-disposes for OSA/OHS. 4. Hypotension - Improved on dobutamine.  5. ARF on CKD II-III - Baseline creatinine ~ 1.5-1.6 over past few months - Trending down with diuresis and dobutamine.  Continue to follow closely.   Length of Stay: 1  Annamaria Helling  05/01/2017, 8:01 AM  Advanced Heart Failure Team Pager [phone] (M-F; 7a - 4p)  Please contact [facility] for night-coverage after hours (4p -7a ) and weekends on [email or web address]  Patient seen with PA, agree with the above  note.  Co-ox 64%.  On exam, she still looks volume overloaded though we're actually getting CVP around 8.  Creatinine coming down.  - Continue dobutamine 2.5.  - Continue IV Lasix today, maybe to po tomorrow if she continues to diurese well (as she is now).  - BP stable, add Bidil 1/2 tab tid.  - Remains in atrial fibrillation on warfarin (supratherapeutic INR).  Would plan for DCCV next week.  She is on amiodarone.   Loralie Champagne 05/01/2017

## 2017-05-01 NOTE — Progress Notes (Signed)
ANTICOAGULATION CONSULT NOTE - Initial Consult  Pharmacy Consult for Coumadin Indication: atrial fibrillation  Allergies  Allergen Reactions  . Ace Inhibitors Palpitations    Patient Measurements: Height: 5\' 5"  (165.1 cm) Weight: 279 lb (126.6 kg) IBW/kg (Calculated) : 57 Heparin Dosing Weight: n/a  Vital Signs: Temp: 98.6 F (37 C) (06/08 0800) Temp Source: Oral (06/08 0800) BP: 100/70 (06/08 0800) Pulse Rate: 92 (06/08 0800)  Labs:  Recent Labs  04/29/17 0934 04/29/17 1822 04/30/17 0022 04/30/17 0532 05/01/17 0404  HGB 13.8  --   --  12.6  --   HCT 44.3  --   --  41.5  --   PLT 137*  --   --  120*  --   LABPROT 46.4*  --   --  40.8* 33.6*  INR 4.81*  --   --  4.10* 3.22  CREATININE 4.35*  --   --  3.79* 2.93*  TROPONINI  --  0.05* 0.05* 0.04*  --     Estimated Creatinine Clearance: 26.3 mL/min (A) (by C-G formula based on SCr of 2.93 mg/dL (H)).   Medical History: Past Medical History:  Diagnosis Date  . Arthritis   . CAD in native artery, s/p cardiac cath with non obstructive CAD 10/24/2016  . CHF (congestive heart failure) ([place])   . DIVERTICULITIS, HX OF 07/25/2007  . DIVERTICULOSIS, COLON 07/22/2007  . Dyspnea   . Edema, peripheral    a. chronic BLE edema, R>L. Prior trauma from dog attack and accident.  Marland Kitchen HYPERLIPIDEMIA 02/03/2008  . Hypersomnia    declines w/u  . Hypertension   . MENOPAUSAL DISORDER 01/09/2011  . Morbid obesity ([place]) 07/22/2007  . NICM (nonischemic cardiomyopathy) ([place]) 10/24/2016  . Raynaud's syndrome 07/22/2007  . Stroke ([place]) 2017  . THYROID NODULE, RIGHT 01/04/2010  . VITAMIN D DEFICIENCY 01/09/2011   Qualifier: Diagnosis of  By: [name], [name]     Medications:  Scheduled:  . chlorhexidine  15 mL Mouth Rinse BID  . furosemide  80 mg Intravenous BID  . ipratropium  0.5 mg Nebulization Q6H  . mouth rinse  15 mL Mouth Rinse q12n4p  . rosuvastatin  10 mg Oral Daily  . sodium chloride flush  10-40 mL Intracatheter Q12H  .  sodium chloride flush  3 mL Intravenous Q12H  . warfarin  1 mg Oral ONCE-1800  . Warfarin - Pharmacist Dosing Inpatient   Does not apply q1800    Assessment: 66 yo female on chronic Coumadin for afib.  Admitted with supratherapeutic INR, now falling quickly.  No bleeding or complications noted.  Discussed with HF team, will resume Coumadin today to prevent precipitous fall.  Would like to keep INR > 2 given plan for potential cardioversion soon.  PTA Coumadin dose 3 mg daily, although dose had been reduced at last several outpatient visits.  May need less than this at discharge.  Goal of Therapy:  INR 2-3 Monitor platelets by anticoagulation protocol: Yes   Plan:  1. Coumadin 1 mg x 1 tonight. 2. Daily PT/INR.  Uvaldo Rising, BCPS  Clinical Pharmacist Pager [phone]  05/01/2017 9:47 AM

## 2017-05-01 NOTE — Progress Notes (Signed)
[facility] pt for [facility] Pharmacy for possible home inotrope therapy and home HF management with [facility] will follow course while an inpatient to support at DC to home.  If patient discharges after hours, please call [phone].   Patricia Wagner 05/01/2017, 9:46 AM

## 2017-05-01 NOTE — Evaluation (Signed)
Physical Therapy Evaluation Patient Details Name: Patricia Wagner MRN: [id number] DOB: [birth year] Today's Date: 05/01/2017   History of Present Illness  65 admitted with respiratory failure and heart failure. PMHx: CHF, AFib, cKD, chronic cor pulmonale, CAD, ICM, obestity, CVA  Clinical Impression  Pt pleasant with decreased activity tolerance, strength, gait, safety awareness and cardiopulmonary function who will benefit from acute therapy to maximize mobility, safety and independence for eventual safe return home. Pt with HR 115-149 with gait limiting distance today. Portable tele box not working and artificial nails inhibited ability for pulse ox. On 1L with activity sats dropped to 87% on 3L they maintained 97% for what periods a pleth was available. Pt with incontinent stool, inability to perform pericare, increased time for all activity and increased time for processing commands and cues. Pt reports she does not have 24hr care and at this time ST-SNF most appropriate. Will follow acutely. Pt also noted to have reddened area to her left sacral shelf with RN notified.     Follow Up Recommendations SNF;Supervision for mobility/OOB    Equipment Recommendations  Rolling walker with 5" wheels    Recommendations for Other Services       Precautions / Restrictions Precautions Precautions: Fall Precaution Comments: watch sats Restrictions Weight Bearing Restrictions: No      Mobility  Bed Mobility               General bed mobility comments: in chair on arrival  Transfers Overall transfer level: Needs assistance   Transfers: Sit to/from Stand Sit to Stand: Min guard         General transfer comment: cues for hand placement and safety  Ambulation/Gait Ambulation/Gait assistance: Min assist Ambulation Distance (Feet): 40 Feet Assistive device: Rolling walker (2 wheeled);Straight cane Gait Pattern/deviations: Step-through pattern;Decreased stride length;Trunk  flexed;Shuffle   Gait velocity interpretation: Below normal speed for age/gender General Gait Details: Pt with decreased stride, safety and control of cane. Pt with LOB x 1 with assist to control balance with cane. Pt with increased safety and stability with use of RW for last 10' after seated rest at toilet  Stairs            Wheelchair Mobility    Modified Rankin (Stroke Patients Only)       Balance Overall balance assessment: Needs assistance   Sitting balance-Leahy Scale: Good       Standing balance-Leahy Scale: Poor                               Pertinent Vitals/Pain Pain Assessment: No/denies pain    Home Living Family/patient expects to be discharged to:: Private residence Living Arrangements: Alone Available Help at Discharge: Family;Available PRN/intermittently Type of Home: Apartment Home Access: Level entry     Home Layout: One level Home Equipment: Cane - single point      Prior Function Level of Independence: Independent               Hand Dominance        Extremity/Trunk Assessment   Upper Extremity Assessment Upper Extremity Assessment: Generalized weakness    Lower Extremity Assessment Lower Extremity Assessment: Generalized weakness    Cervical / Trunk Assessment Cervical / Trunk Assessment: Kyphotic  Communication   Communication: No difficulties  Cognition Arousal/Alertness: Awake/alert   Overall Cognitive Status: Impaired/Different from baseline Area of Impairment: Safety/judgement;Following commands  Following Commands: Follows one step commands inconsistently Safety/Judgement: Decreased awareness of safety;Decreased awareness of deficits     General Comments: pt stating she cares for herself but needed cues for how to use cane, perform pericare and for safety and cues to initiate throughout      General Comments      Exercises     Assessment/Plan    PT  Assessment Patient needs continued PT services  PT Problem List Decreased strength;Decreased mobility;Decreased safety awareness;Decreased activity tolerance;Decreased knowledge of use of DME;Cardiopulmonary status limiting activity;Decreased balance;Decreased cognition;Obesity       PT Treatment Interventions Gait training;Therapeutic exercise;Patient/family education;Functional mobility training;Stair training;DME instruction;Therapeutic activities;Cognitive remediation    PT Goals (Current goals can be found in the Care Plan section)  Acute Rehab PT Goals Patient Stated Goal: return home PT Goal Formulation: With patient Time For Goal Achievement: 05/15/17 Potential to Achieve Goals: Fair    Frequency Min 3X/week   Barriers to discharge Decreased caregiver support      Co-evaluation               AM-PAC PT "6 Clicks" Daily Activity  Outcome Measure Difficulty turning over in bed (including adjusting bedclothes, sheets and blankets)?: A Lot Difficulty moving from lying on back to sitting on the side of the bed? : Total Difficulty sitting down on and standing up from a chair with arms (e.g., wheelchair, bedside commode, etc,.)?: A Lot Help needed moving to and from a bed to chair (including a wheelchair)?: A Little Help needed walking in hospital room?: A Little Help needed climbing 3-5 steps with a railing? : A Lot 6 Click Score: 13    End of Session Equipment Utilized During Treatment: Gait belt;Oxygen Activity Tolerance: Patient tolerated treatment well Patient left: in chair;with call bell/phone within reach;with family/visitor present Nurse Communication: Mobility status;Precautions PT Visit Diagnosis: Unsteadiness on feet (R26.81);Other abnormalities of gait and mobility (R26.89);Difficulty in walking, not elsewhere classified (R26.2);Muscle weakness (generalized) (M62.81)    Time: 3149-7026 PT Time Calculation (min) (ACUTE ONLY): 40 min   Charges:   PT  Evaluation $PT Eval Moderate Complexity: 1 Procedure PT Treatments $Gait Training: 8-22 mins $Therapeutic Activity: 8-22 mins   PT G Codes:        Elwyn Reach, PT [phone]   [place] 05/01/2017, 2:17 PM

## 2017-05-01 NOTE — Care Management Note (Addendum)
Case Management Note  Patient Details  Name: Patricia Wagner MRN: [id number] Date of Birth: August 05, 1951  Subjective/Objective:   Pt admitted for SOB, hypotensive, AKI  Action/Plan:   PTA from home - pt is on BIPAP >24 hours.  Pt was independent from home per daughter.  Possible discharge with inotropes - starting dobutamine.  [place] notified of possible home IV infusion referral.  PT eval will be ordered when pt is medically stable.   Expected Discharge Date:  05/04/17               Expected Discharge Plan:  [place]  In-House Referral:     Discharge planning Services     Post Acute Care Choice:    Choice offered to:     DME Arranged:    DME Agency:     HH Arranged:    HH Agency:     Status of Service:     If discussed at [employer], dates discussed:    Additional Comments: 05/01/2017  SNF recommended - CSW consulted  CM contacted [facility] and verified [facility] is in [place] verified both HH and IV infusion orders can be submitted via [facility] directly.  CM will continue to follow for discharge needs.    Pt started on inotrope and IV amio yesterday.  CM contacted [facility] and informed of tentative referral for IV infusion and RN.  Per [facility] - agency will seek insurance auth for both IV infusion and HHRN.   As a back up plan ; CM requested HHRN order from [place] - once order is received CM will verify process with [employer].  Sleep study order form placed on shadow chart - PA aware [name], RN 05/01/2017, 8:40 AM

## 2017-05-01 NOTE — Progress Notes (Signed)
   Will defer to advanced heart failure service.

## 2017-05-01 NOTE — Consult Note (Signed)
[facility] Attending Consult  Physician Requesting Consult:  Daneen Schick, M.D/ Cardiology  Date of Consult:  04/29/2017  Reason for Consult/Chief Complaint:  Acute Hypercarbic Respiratory Failure  History of Presenting Illness:  66 y.o. female with known history of atrial fibrillation beginning in 2017. Patient has a known history of non-obstructive coronary artery disease as well as systolic congestive heart failure and some element of cor pulmonale. Patient is on Coumadin chronically for systemic anticoagulation. Her most recent transesophageal echocardiogram in May during cardioversion showed worsening of her left ventricular dysfunction and cor pulmonale. Patient has been treated with Lasix twice daily and was previously started on Entresto.  she reports her lower extremity edema had progressively improved but she had been experiencing increasing fatigue and dyspnea on exertion. Patient denies any orthopnea but has experienced some intermittent paroxysmal nocturnal dyspnea. She denies any chest pain, pressure, or tightness. She denies any cough or wheezing. She denies any subjective fever, chills, or sweats. She denies any sore throat or sinus congestion. EMS was called today with worsening respiratory distress. She was noted to be hypotensive and was given a small IV fluid bolus. The patient had questionable hypoxia obscured by her fingernail treatment and was ultimately found to have acute hypercarbic respiratory failure as well as acute renal failure during her workup. With patient's multiple medical problems and acute hypercarbic respiratory failure we were consulted to provide further assistance in her workup.   Overnight:    FiO2 (%):  [40 %] 40 %  Temp:  [97.3 F (36.3 C)-98.7 F (37.1 C)] 98.7 F (37.1 C) (06/08 1215) Pulse Rate:  [63-102] 84 (06/08 1200) Resp:  [11-26] 15 (06/08 1215) BP: (91-149)/(55-125) 149/125 (06/08 1215) SpO2:  [88 %-100 %] 99 % (06/08  1200) FiO2 (%):  [40 %] 40 % (06/08 0700) Weight:  [126.6 kg (279 lb)] 126.6 kg (279 lb) (06/08 0412)  General: Neuro: HEENT: PULM: CV:  GI: Extremities:    LINES/TUBES: PIV  CBC Latest Ref Rng & Units 04/30/2017 04/29/2017 03/25/2017  WBC 4.0 - 10.5 K/uL 6.4 7.6 -  Hemoglobin 12.0 - 15.0 g/dL 12.6 13.8 15.0  Hematocrit 36.0 - 46.0 % 41.5 44.3 44.0  Platelets 150 - 400 K/uL 120(L) 137(L) -    BMP Latest Ref Rng & Units 05/01/2017 05/01/2017 04/30/2017  Glucose 65 - 99 mg/dL 97 69 70  BUN 6 - 20 mg/dL 54(H) 59(H) 76(H)  Creatinine 0.44 - 1.00 mg/dL 2.60(H) 2.93(H) 3.79(H)  Sodium 135 - 145 mmol/L 144 144 142  Potassium 3.5 - 5.1 mmol/L 3.4(L) 3.9 4.7  Chloride 101 - 111 mmol/L 101 99(L) 103  CO2 22 - 32 mmol/L 34(H) 35(H) 28  Calcium 8.9 - 10.3 mg/dL 8.7(L) 8.6(L) 8.6(L)    Hepatic Function Latest Ref Rng & Units 05/01/2017 04/30/2017 04/29/2017  Total Protein 6.5 - 8.1 g/dL - - 6.3(L)  Albumin 3.5 - 5.0 g/dL 2.9(L) 2.7(L) 3.2(L)  AST 15 - 41 U/L - - 12(L)  ALT 14 - 54 U/L - - 16  Alk Phosphatase 38 - 126 U/L - - 85  Total Bilirubin 0.3 - 1.2 mg/dL - - 1.3(H)  Bilirubin, Direct 0.1 - 0.5 mg/dL - - -    IMAGING/STUDIES: TTE 11/21/16:  LV mild to moderately dilated. Mild LVH. EF 20-25% with diffuse hypokinesis and indeterminant diastolic function in the setting of atrial fibrillation. LA severely dilated & RA moderately dilated. RV normal in size with moderately reduced systolic function. No aortic stenosis or regurgitation.  Aortic root normal in size. Trivial mitral regurgitation without stenosis. No pulmonic regurgitation. No significant tricuspid regurgitation. Trivial pericardial effusion posterior to the heart.  TEE 03/25/17:  LV EF 15% with diffuse hypokinesis. LA moderately dilated without thrombus. RA mildly dilated. RV moderately dilated with severely reduced systolic function. No aortic regurgitation. No PFO. No pulmonic regurgitation. Moderate tricuspid regurgitation. Trivial  pericardial effusion.  PORT CXR 6/6:  Personally reviewed by me. No focal opacity or effusion appreciated. Questionable mild pulmonary vascular congestion. Suggestion of cardiomegaly.  CT HEAD W/O 6/6:  No acute finding by CT. 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 Old right posterior parietal cortical infarction.  RENAL U/S 6/6:  Unremarkable limited ultrasound of the kidneys and bladder, with no hydronephrosis.  MICROBIOLOGY: None.  ANTIBIOTICS: None.  SIGNIFICANT EVENTS: 06/06 - Admit & PCCM consult for acute hypercarbic respiratory failure  ASSESSMENT/PLAN:  66 y.o.  female with known atrial fibrillation and systolic congestive heart failure with cor pulmonale. Patient does have ongoing tobacco use and given the chronicity this would suggest the probability of underlying COPD. However, the patient has no wheezing on physical exam today. Her hypotension upon arrival has resolved at the time of my exam patient. With BiPAP the patient had normal work of breathing as well as normal cognition at the time of my exam. I reviewed the patient's chest x-ray imaging which shows no reason for her hypoxia which was previously reported and I suspect artifactual if not secondary to her hypotension. It is conceivable that the patient does have some element of obstructive sleep apnea given her body habitus but has never undergone workup. Her renal ultrasound on arrival shows no hydronephrosis and nephrology has been consulted.   1. Acute hypercarbic respiratory failure: although pcxr was not impressive as far as volume, she has responded well to lasix , dobut and now with no distress, can dc NIMV, maintain neg balance  2. Hypotension: resolved as volume has been removed , maintain neg balance 3. Probable COPD:  Atrovent nebulized every 6 hours 4. Acute renal failure: resolving with neg balance, lasix, continues to have improved crt, follow lytes, [place] neg hydro,  continued to have dilute urine, more to give 5. Cor pulmonale: Patient needs screening for possible underlying sleep apnea as an outpatient, should folow up with Dr Sabino Gasser 6. Biventricular heart failure: if BP tolerates - Coreg & Crestor. 7. Atrial fibrillation: Continuous telemetry monitoring ordered., control rate  Will sign off, call if needed  Lavon Paganini. Titus Mould, MD, [place] Pgr: [facility]

## 2017-05-01 NOTE — Progress Notes (Signed)
[facility] Rounding Note  Subjective/Interval History:  Improved BP On dobutamine/amio drips  Almost unbelievable diuresis (>8 liters!) Creatinine falling nicely  Objective Vital signs in last 24 hours: Vitals:   05/01/17 0700 05/01/17 0755 05/01/17 0800 05/01/17 0827  BP: 119/62  100/70   Pulse: 82 85 92   Resp: 15 19 14    Temp:   98.6 F (37 C)   TempSrc:   Oral   SpO2: 92% 91% 91% 91%  Weight:      Height:       Weight change: -4.082 kg (-9 lb)  Intake/Output Summary (Last 24 hours) at 05/01/17 0912 Last data filed at 05/01/17 0800  Gross per 24 hour  Intake          1061.05 ml  Output             8700 ml  Net         -7638.95 ml   Physical Exam:  Blood pressure 100/70, pulse 92, temperature 98.6 F (37 C), temperature source Oral, resp. rate 14, height 5\' 5"  (1.651 m), weight 126.6 kg (279 lb), SpO2 91 %.   CVP 8  Weight 131 kg->126.6 kg  Awake, alert Oriented Denies pain or SOB Generalized anasarca somewhat better Abdomen obese, no focal tenderness, + BS's Still with edema to the hips Clear yellow urine in foley   Recent Labs Lab 04/29/17 0934 04/29/17 1822 04/30/17 0532 05/01/17 0404  NA 138  --  142 144  K 5.3* 5.1 4.7 3.9  CL 103  --  103 99*  CO2 24  --  28 35*  GLUCOSE 115*  --  70 69  BUN 80*  --  76* 59*  CREATININE 4.35*  --  3.79* 2.93*  CALCIUM 8.8*  --  8.6* 8.6*  PHOS  --   --  5.5* 4.3     Recent Labs Lab 04/29/17 0934 04/30/17 0532 05/01/17 0404  AST 12*  --   --   ALT 16  --   --   ALKPHOS 85  --   --   BILITOT 1.3*  --   --   PROT 6.3*  --   --   ALBUMIN 3.2* 2.7* 2.9*     Recent Labs Lab 04/29/17 0934 04/30/17 0532  WBC 7.6 6.4  NEUTROABS 6.2 4.3  HGB 13.8 12.6  HCT 44.3 41.5  MCV 89.7 90.0  PLT 137* 120*    Recent Labs Lab 04/29/17 1822 04/30/17 0022 04/30/17 0532  TROPONINI 0.05* 0.05* 0.04*     Recent Labs Lab 04/29/17 0931 04/29/17 2146 04/30/17 1131  GLUCAP 116* 72 77   Lab Results   Component Value Date   INR 3.22 05/01/2017   INR 4.10 (HH) 04/30/2017   INR 4.81 (HH) 04/29/2017   ABG    Component Value Date/Time   PHART 7.269 (L) 04/29/2017 1622   PCO2ART 63.7 (H) 04/29/2017 1622   PO2ART 106.0 04/29/2017 1622   HCO3 29.2 (H) 04/29/2017 1622   TCO2 31 04/29/2017 1622   ACIDBASEDEF 1.0 04/29/2017 1330   O2SAT 97.0 04/29/2017 1622   Results for MARGARET, COCKERILL (MRN [id number]) as of 04/30/2017 10:47  Ref. Range 04/29/2017 19:07  Appearance Latest Ref Range: CLEAR  CLOUDY (A)  Bacteria, UA Latest Ref Range: NONE SEEN  MANY (A)  Bilirubin Urine Latest Ref Range: NEGATIVE  NEGATIVE  Color, Urine Latest Ref Range: YELLOW  AMBER (A)  Glucose Latest Ref Range: NEGATIVE mg/dL NEGATIVE  Hgb urine dipstick  Latest Ref Range: NEGATIVE  NEGATIVE  Hyaline Casts, UA Unknown PRESENT  Ketones, ur Latest Ref Range: NEGATIVE mg/dL NEGATIVE  Leukocytes, UA Latest Ref Range: NEGATIVE  NEGATIVE  Nitrite Latest Ref Range: NEGATIVE  POSITIVE (A)  pH Latest Ref Range: 5.0 - 8.0  6.0  Protein Latest Ref Range: NEGATIVE mg/dL 100 (A)  RBC / HPF Latest Ref Range: 0 - 5 RBC/hpf 0-5  Specific Gravity, Urine Latest Ref Range: 1.005 - 1.030  1.014  Squamous Epithelial / LPF Latest Ref Range: NONE SEEN  0-5 (A)  WBC, UA Latest Ref Range: 0 - 5 WBC/hpf 6-30  Sodium, Urine Latest Units: mmol/L <10   Studies/Results: Ct Head Wo Contrast  Result Date: 04/29/2017 CLINICAL DATA:  Worsening slurred speech beginning 3 days ago. Left leg weakness over the last week. EXAM: CT HEAD WITHOUT CONTRAST TECHNIQUE: Contiguous axial images were obtained from the base of the skull through the vertex without intravenous contrast. COMPARISON:  CT and MRI studies 11/20/2016 FINDINGS: Brain: Left superior cerebellar infarction which was acute in December has progressed to atrophy/ encephalomalacia up. Old small right posterior parietal cortical infarction is unchanged. No sign of acute or subacute infarction, mass  lesion, hemorrhage, hydrocephalus or extra-axial collection. Vascular: There is atherosclerotic calcification of the major vessels at the base of the brain. Skull: Negative Sinuses/Orbits: Clear/normal Other: None significant IMPRESSION: No acute finding by CT. 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 Old right posterior parietal cortical infarction. Electronically Signed   By: Nelson Chimes M.D.   On: 04/29/2017 11:07   US Renal  Result Date: 04/29/2017 CLINICAL DATA:  Acute kidney injury EXAM: RENAL / URINARY TRACT ULTRASOUND COMPLETE COMPARISON:  None. FINDINGS: Examination limited by patient related factors . Right Kidney: Length: 10.0 cm. Echogenicity within normal limits. No mass or hydronephrosis visualized. Left Kidney: Length: 9.9 cm. Echogenicity within normal limits. No mass or hydronephrosis visualized. Bladder: Appears normal for degree of bladder distention. IMPRESSION: Unremarkable limited ultrasound of the kidneys and bladder, with no hydronephrosis. Electronically Signed   By: Ilona Sorrel M.D.   On: 04/29/2017 17:08   Dg Chest Port 1 View  Result Date: 04/30/2017 CLINICAL DATA:  PICC line placement EXAM: PORTABLE CHEST 1 VIEW COMPARISON:  04/30/2017 FINDINGS: Stable cardiomegaly with aortic atherosclerosis. Right-sided PICC line tip is seen in the distal SVC. Mild interstitial edema persists. Increased retrocardiac opacity may be secondary to atelectasis, effusion or bibasilar pneumonia. IMPRESSION: 1. PICC line tip in the distal SVC. 2. Cardiomegaly with aortic atherosclerosis and mild interstitial edema. Retrocardiac opacities persist which may reflect atelectasis and/or pneumonia. A small left effusion is not entirely excluded. No significant change from prior. Electronically Signed   By: Ashley Royalty M.D.   On: 04/30/2017 17:46   Dg Chest Port 1 View  Result Date: 04/30/2017 CLINICAL DATA:  Breath EXAM: PORTABLE CHEST 1 VIEW COMPARISON:   04/29/2017 FINDINGS: Cardiomegaly with vascular congestion. No confluent airspace opacities, effusions or overt edema. No acute bony abnormality. IMPRESSION: Cardiomegaly, vascular congestion. Electronically Signed   By: Rolm Baptise M.D.   On: 04/30/2017 08:15   Dg Chest Port 1 View  Result Date: 04/29/2017 CLINICAL DATA:  Slurred speech, weakness EXAM: PORTABLE CHEST 1 VIEW COMPARISON:  11/21/2016 FINDINGS: Cardiomegaly with vascular congestion. No confluent opacity, effusion or edema. No acute bony abnormality. IMPRESSION: Cardiomegaly, vascular congestion. Electronically Signed   By: Rolm Baptise M.D.   On: 04/29/2017 10:01   TEE 03/25/17 Study  Conclusions  - Left ventricle: Systolic function was severely reduced. The   estimated ejection fraction was 15%. Diffuse hypokinesis. - Aortic valve: No evidence of vegetation. - Mitral valve: No evidence of vegetation. There was mild to   moderate regurgitation. - Left atrium: The atrium was moderately dilated. No evidence of   thrombus in the atrial cavity or appendage. - Right ventricle: The cavity size was mildly dilated. Systolic   function was severely reduced. - Right atrium: The atrium was mildly dilated. - Atrial septum: No defect or patent foramen ovale was identified. - Tricuspid valve: No evidence of vegetation. There was moderate   regurgitation. - Pulmonic valve: No evidence of vegetation. - Pericardium, extracardiac: A trivial pericardial effusion was   identified.  Impressions:  - Severe LV dysfunction; mild to moderate MR; moderate LAE; no LAA   thrombus; mild RAE and RVE; severely reduced RV function;   moderate TR.   Medications: . sodium chloride    . amiodarone 30 mg/hr (05/01/17 0800)  . DOBUTamine 2.5 mcg/kg/min (05/01/17 0800)   . chlorhexidine  15 mL Mouth Rinse BID  . furosemide  80 mg Intravenous BID  . ipratropium  0.5 mg Nebulization Q6H  . mouth rinse  15 mL Mouth Rinse q12n4p  . rosuvastatin  10 mg  Oral Daily  . sodium chloride flush  10-40 mL Intracatheter Q12H  . sodium chloride flush  3 mL Intravenous Q12H  . warfarin  1 mg Oral ONCE-1800  . Warfarin - Pharmacist Dosing Inpatient   Does not apply q1800   Background: PMH nonobstructive CAD, ischemic CCM, s/d CHF (EF 20-25%), severe R HF, atrial fib with failed DC cardioversions (was being teed up for ablation). Presented to the ED with worsening edema, SOB, noted to be hypotensive in the 70's, lethargic, labs with creatinine of 4.35 BUN 80, K 5.3 (creatinine was 1.6 on 03/25/17, usual baseline 1.1-1.3 past 6 months). Noted respiratory acidosis (pCO2 64) placed on BIPAP. PTA was taking Entresto and naprosyn. We were asked to see for AKI on CKD3. Kidneys 10, 9.9 no hydro.  Assessment/Recommendations  1. AKI on CKD3- hemodynamically driven - hypotension/R and left heart failure, Entresto and NSAIDS on board PTA. FeNa <1% supported dx. On inotropes now, excellent UOP, creatinine falling nicely.  K corrected w/kayexalate and remains nl. Has avoided need for RRT. Heart failure team managing diuretics.  2. A on C biventricular CHF with cardiogenic shock - doing well on Dobutamine. Recent TEE 5/2 severe R/L HF w/LVEF 15%. Dr.B anticipates possible need for home inotropes.  3. Hyperkalemia - resolved post Kayexalate X 1 and remains normal 4. Acute on chronic hypercarbic resp failure - off BIPAP/improved 5. Mixed metabolic and resp acidosis - improved. (Admission  pCO2 64/venous CO2 24). Now back w/usual CO2 in 30's. No new ABG but clinically better. 6. Atrial fib - on IV amio 7. Supra therapeutic INR - falling. Pharmacy managing coumadin 8. Baseline CKD3 - proteinuria by dipstick 100 mg% (repeating UA and UPC)    From a renal perspective, improving renal function, excellent diuresis, HF team managing inotropes and diuretics. Recommend continue trending labs. Has avoided need for RRT. I'll followup on the UA. Will sign off at this time - please  call if can help.   Jamal Maes, MD [facility] [phone] pager 05/01/2017, 9:12 AM

## 2017-05-02 DIAGNOSIS — L8990 Pressure ulcer of unspecified site, unspecified stage: Secondary | ICD-10-CM | POA: Insufficient documentation

## 2017-05-02 LAB — RENAL FUNCTION PANEL
ALBUMIN: 2.7 g/dL — AB (ref 3.5–5.0)
ANION GAP: 10 (ref 5–15)
BUN: 41 mg/dL — ABNORMAL HIGH (ref 6–20)
CALCIUM: 8.1 mg/dL — AB (ref 8.9–10.3)
CO2: 38 mmol/L — ABNORMAL HIGH (ref 22–32)
Chloride: 96 mmol/L — ABNORMAL LOW (ref 101–111)
Creatinine, Ser: 2.01 mg/dL — ABNORMAL HIGH (ref 0.44–1.00)
GFR, EST AFRICAN AMERICAN: 29 mL/min — AB (ref >60)
GFR, EST NON AFRICAN AMERICAN: 25 mL/min — AB (ref >60)
Glucose, Bld: 99 mg/dL (ref 65–99)
PHOSPHORUS: 3.8 mg/dL (ref 2.5–4.6)
Potassium: 3.5 mmol/L (ref 3.5–5.1)
SODIUM: 144 mmol/L (ref 135–145)

## 2017-05-02 LAB — PROTIME-INR
INR: 2.81
Prothrombin Time: 30.2 seconds — ABNORMAL HIGH (ref 11.4–15.2)

## 2017-05-02 LAB — COOXEMETRY PANEL
Carboxyhemoglobin: 1.9 % — ABNORMAL HIGH (ref 0.5–1.5)
Methemoglobin: 0.8 % (ref 0.0–1.5)
O2 Saturation: 77.8 %
Total hemoglobin: 13.8 g/dL (ref 12.0–16.0)

## 2017-05-02 MED ORDER — WARFARIN SODIUM 2 MG PO TABS
3.0000 mg | ORAL_TABLET | Freq: Once | ORAL | Status: AC
  Administered 2017-05-02: 3 mg via ORAL
  Filled 2017-05-02: qty 1.5

## 2017-05-02 MED ORDER — POTASSIUM CHLORIDE CRYS ER 20 MEQ PO TBCR
40.0000 meq | EXTENDED_RELEASE_TABLET | Freq: Once | ORAL | Status: AC
  Administered 2017-05-02: 40 meq via ORAL
  Filled 2017-05-02: qty 2

## 2017-05-02 NOTE — Progress Notes (Signed)
Patient ID: Patricia Wagner, female   DOB: 06-Sep-1951, 66 y.o.   MRN: [id number]     Advanced Heart Failure Rounding Note  Primary Cardiologist: Dr. [name]  Subjective:    Remains on dobutamine and amiodarone gtt. Got IV Lasix yesterday, weight down another 11 lbs.     Feeling a lot better overall, less short of breath.  Still hypoxemic, 84% on room air this morning.   Creatinine 3.79 -> 2.93 -> 2.0.  Objective:   Weight Range: 268 lb 4.8 oz (121.7 kg) Body mass index is 44.65 kg/m.   Vital Signs:   Temp:  [98.3 F (36.8 C)-98.7 F (37.1 C)] 98.7 F (37.1 C) (06/09 0842) Pulse Rate:  [63-117] 85 (06/09 0500) Resp:  [14-33] 14 (06/09 0500) BP: (72-149)/(47-125) 99/62 (06/09 0500) SpO2:  [94 %-100 %] 96 % (06/09 0500) Weight:  [268 lb 4.8 oz (121.7 kg)] 268 lb 4.8 oz (121.7 kg) (06/09 0500) Last BM Date: 04/27/17  Weight change: Filed Weights   04/30/17 0300 05/01/17 0412 05/02/17 0500  Weight: 289 lb (131.1 kg) 279 lb (126.6 kg) 268 lb 4.8 oz (121.7 kg)    Intake/Output:   Intake/Output Summary (Last 24 hours) at 05/02/17 0907 Last data filed at 05/02/17 0500  Gross per 24 hour  Intake           1375.3 ml  Output             7435 ml  Net          -6059.7 ml     Physical Exam: General: NAD.  Obese.  HEENT: Normal Neck: supple. JVP 10 cm. Carotids 2+ bilat; no bruits. No thyromegaly or nodule noted. Cor: PMI nondisplaced. RRR, No M/G/R noted Lungs: Diminished throughout.  Abdomen: soft, non-tender, distended, no HSM. No bruits or masses. +BS  Extremities: no cyanosis, clubbing, or rash. 1+ edema to knees bilaterally.  Neuro: alert & orientedx3, cranial nerves grossly intact. moves all 4 extremities w/o difficulty. Affect pleasant   Telemetry: Personally reviewed, afib in 90s-100s  Labs: CBC  Recent Labs  04/29/17 0934 04/30/17 0532  WBC 7.6 6.4  NEUTROABS 6.2 4.3  HGB 13.8 12.6  HCT 44.3 41.5  MCV 89.7 90.0  PLT 137* 010*   Basic Metabolic  Panel  Recent Labs  05/01/17 0404 05/01/17 1150 05/02/17 0526  NA 144 144 144  K 3.9 3.4* 3.5  CL 99* 101 96*  CO2 35* 34* 38*  GLUCOSE 69 97 99  BUN 59* 54* 41*  CREATININE 2.93* 2.60* 2.01*  CALCIUM 8.6* 8.7* 8.1*  MG 2.0  --   --   PHOS 4.3  --  3.8   Liver Function Tests  Recent Labs  04/29/17 0934  05/01/17 0404 05/02/17 0526  AST 12*  --   --   --   ALT 16  --   --   --   ALKPHOS 85  --   --   --   BILITOT 1.3*  --   --   --   PROT 6.3*  --   --   --   ALBUMIN 3.2*  < > 2.9* 2.7*  < > = values in this interval not displayed. No results for input(s): LIPASE, AMYLASE in the last 72 hours. Cardiac Enzymes  Recent Labs  04/29/17 1822 04/30/17 0022 04/30/17 0532  TROPONINI 0.05* 0.05* 0.04*    BNP: BNP (last 3 results)  Recent Labs  10/31/16 1246 11/20/16 1121 04/29/17 0935  BNP 1,218.8*  1,555.6* 2,712.1*    ProBNP (last 3 results) No results for input(s): PROBNP in the last 8760 hours.   D-Dimer No results for input(s): DDIMER in the last 72 hours. Hemoglobin A1C No results for input(s): HGBA1C in the last 72 hours. Fasting Lipid Panel No results for input(s): CHOL, HDL, LDLCALC, TRIG, CHOLHDL, LDLDIRECT in the last 72 hours. Thyroid Function Tests No results for input(s): TSH, T4TOTAL, T3FREE, THYROIDAB in the last 72 hours.  Invalid input(s): FREET3  Other results:  Imaging/Studies:  No results found.  Medications:     Scheduled Medications: . chlorhexidine  15 mL Mouth Rinse BID  . Chlorhexidine Gluconate Cloth  6 each Topical Q0600  . furosemide  80 mg Intravenous BID  . isosorbide-hydrALAZINE  0.5 tablet Oral TID  . mouth rinse  15 mL Mouth Rinse q12n4p  . potassium chloride  40 mEq Oral Once  . rosuvastatin  10 mg Oral Daily  . sodium chloride flush  10-40 mL Intracatheter Q12H  . sodium chloride flush  3 mL Intravenous Q12H  . Warfarin - Pharmacist Dosing Inpatient   Does not apply q1800    Infusions: . sodium  chloride    . amiodarone 30 mg/hr (05/02/17 0400)  . DOBUTamine 2.5 mcg/kg/min (05/02/17 0400)    PRN Medications: sodium chloride, acetaminophen, ondansetron (ZOFRAN) IV, sodium chloride flush, sodium chloride flush   Assessment/Plan   1. Acute on chronic biventricular CHF: TEE 03/25/17 with LVEF 15% and severely reduced RV function.  She is diuresing very well with Lasix + dobutamine 2.5.  Co-ox 78% today.  CVP has been consistently reading low (6 this morning) but she still appears volume overloaded on exam with JVD, peripheral edema, and hypoxemia. Creatinine continues to fall.  - Would give at least 1 more day of IV diuresis, possible transition to torsemide 40 mg daily tomorrow.  - Continue dobutamine 2.5 mcg/kg/min for now.  Good co-ox.  Will attempt weaning when fully diuresed.    - No BB with suspected low output.  - Continue Bidil 1/2 tab tid.  2. Persistent Afib: Failed multiple DC-CV. Last attempt for DCCV 03/25/17 but had stopped her amiodarone. INR therapeutic.  - Continue amio gtt.  Will need to consider DCCV prior to d/c once diuresed, probably early next week.   3. Acute hypercapnic respiratory failure: Suspect OSA at baseline.  Continue CPAP with sleeping.  - Needs sleep study. + Snoring and body habitus pre-disposes for OSA/OHS. 4. 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  Creatinine improving with inotrope and diuresis.   5. COPD: Suspected.  This may play a role in her hypoxemia.  6. PT recommend SNF.    Loralie Champagne 05/02/2017

## 2017-05-02 NOTE — Progress Notes (Signed)
ANTICOAGULATION CONSULT NOTE - Initial Consult  Pharmacy Consult for Coumadin Indication: atrial fibrillation  Allergies  Allergen Reactions  . Ace Inhibitors Palpitations    Patient Measurements: Height: 5\' 5"  (165.1 cm) Weight: 268 lb 4.8 oz (121.7 kg) IBW/kg (Calculated) : 57 Heparin Dosing Weight: n/a  Vital Signs: Temp: 98.5 F (36.9 C) (06/09 1158) Temp Source: Oral (06/09 1158) BP: 99/62 (06/09 0500) Pulse Rate: 85 (06/09 0500)  Labs:  Recent Labs  04/29/17 1822 04/30/17 0022  04/30/17 0532 05/01/17 0404 05/01/17 1150 05/02/17 0526  HGB  --   --   --  12.6  --   --   --   HCT  --   --   --  41.5  --   --   --   PLT  --   --   --  120*  --   --   --   LABPROT  --   --   --  40.8* 33.6*  --  30.2*  INR  --   --   --  4.10* 3.22  --  2.81  CREATININE  --   --   < > 3.79* 2.93* 2.60* 2.01*  TROPONINI 0.05* 0.05*  --  0.04*  --   --   --   < > = values in this interval not displayed.  Estimated Creatinine Clearance: 37.5 mL/min (A) (by C-G formula based on SCr of 2.01 mg/dL (H)).   Medical History: Past Medical History:  Diagnosis Date  . Arthritis   . Atrial fibrillation ([place])    a. s/p multiple cardioversions; failed tikosyn/sotalol.  Marland Kitchen CAD in native artery, s/p cardiac cath with non obstructive CAD 10/24/2016  . CHF (congestive heart failure) ([place])   . DIVERTICULITIS, HX OF 07/25/2007  . DIVERTICULOSIS, COLON 07/22/2007  . Dyspnea   . Edema, peripheral    a. chronic BLE edema, R>L. Prior trauma from dog attack and accident.  Marland Kitchen HYPERLIPIDEMIA 02/03/2008  . Hypersomnia    declines w/u  . Hypertension   . MENOPAUSAL DISORDER 01/09/2011  . Morbid obesity ([place]) 07/22/2007  . NICM (nonischemic cardiomyopathy) ([place]) 10/24/2016  . Raynaud's syndrome 07/22/2007  . Stroke ([place]) 2017  . THYROID NODULE, RIGHT 01/04/2010  . VITAMIN D DEFICIENCY 01/09/2011   Qualifier: Diagnosis of  By: [name], [name]     Medications:  Scheduled:  . Chlorhexidine Gluconate  Cloth  6 each Topical Q0600  . furosemide  80 mg Intravenous BID  . isosorbide-hydrALAZINE  0.5 tablet Oral TID  . rosuvastatin  10 mg Oral Daily  . sodium chloride flush  10-40 mL Intracatheter Q12H  . sodium chloride flush  3 mL Intravenous Q12H  . warfarin  3 mg Oral ONCE-1800  . Warfarin - Pharmacist Dosing Inpatient   Does not apply q1800   Assessment: 66 yo female on chronic Coumadin for afib.  Admitted with supratherapeutic INR, now falling quickly.  No bleeding or complications noted.  Warfarin resumed 6/8.  Would like to keep INR > 2 given plan for potential cardioversion soon.  PTA Coumadin dose 3 mg daily, although dose had been reduced at last several outpatient visits.  May need less than this at discharge.  Goal of Therapy:  INR 2-3 Monitor platelets by anticoagulation protocol: Yes   Plan:  1. Warfarin 3mg  x1 tonight. 2. Daily PT/INR.  Georga Bora, PharmD Clinical Pharmacist 05/02/2017 12:16 PM

## 2017-05-03 DIAGNOSIS — I5021 Acute systolic (congestive) heart failure: Secondary | ICD-10-CM

## 2017-05-03 LAB — COOXEMETRY PANEL
Carboxyhemoglobin: 1.5 % (ref 0.5–1.5)
Methemoglobin: 1 % (ref 0.0–1.5)
O2 Saturation: 72.3 %
Total hemoglobin: 13.5 g/dL (ref 12.0–16.0)

## 2017-05-03 LAB — PROTIME-INR
INR: 2.35
PROTHROMBIN TIME: 26.1 s — AB (ref 11.4–15.2)

## 2017-05-03 LAB — RENAL FUNCTION PANEL
ANION GAP: 11 (ref 5–15)
Albumin: 2.9 g/dL — ABNORMAL LOW (ref 3.5–5.0)
BUN: 30 mg/dL — ABNORMAL HIGH (ref 6–20)
CALCIUM: 8 mg/dL — AB (ref 8.9–10.3)
CO2: 38 mmol/L — ABNORMAL HIGH (ref 22–32)
CREATININE: 1.73 mg/dL — AB (ref 0.44–1.00)
Chloride: 94 mmol/L — ABNORMAL LOW (ref 101–111)
GFR, EST AFRICAN AMERICAN: 35 mL/min — AB (ref >60)
GFR, EST NON AFRICAN AMERICAN: 30 mL/min — AB (ref >60)
Glucose, Bld: 88 mg/dL (ref 65–99)
Phosphorus: 3 mg/dL (ref 2.5–4.6)
Potassium: 3.3 mmol/L — ABNORMAL LOW (ref 3.5–5.1)
SODIUM: 143 mmol/L (ref 135–145)

## 2017-05-03 LAB — MAGNESIUM: Magnesium: 1.4 mg/dL — ABNORMAL LOW (ref 1.7–2.4)

## 2017-05-03 MED ORDER — WARFARIN SODIUM 2 MG PO TABS
3.0000 mg | ORAL_TABLET | Freq: Once | ORAL | Status: AC
  Administered 2017-05-03: 3 mg via ORAL
  Filled 2017-05-03: qty 1.5

## 2017-05-03 MED ORDER — MAGNESIUM SULFATE 2 GM/50ML IV SOLN
2.0000 g | Freq: Once | INTRAVENOUS | Status: AC
  Administered 2017-05-03: 2 g via INTRAVENOUS
  Filled 2017-05-03 (×2): qty 50

## 2017-05-03 MED ORDER — POTASSIUM CHLORIDE CRYS ER 20 MEQ PO TBCR
20.0000 meq | EXTENDED_RELEASE_TABLET | Freq: Once | ORAL | Status: AC
  Administered 2017-05-03: 20 meq via ORAL
  Filled 2017-05-03: qty 1

## 2017-05-03 MED ORDER — CARVEDILOL 6.25 MG PO TABS
6.2500 mg | ORAL_TABLET | Freq: Two times a day (BID) | ORAL | Status: DC
  Administered 2017-05-03 (×2): 6.25 mg via ORAL
  Filled 2017-05-03 (×2): qty 1

## 2017-05-03 MED ORDER — AMIODARONE HCL 200 MG PO TABS
400.0000 mg | ORAL_TABLET | Freq: Two times a day (BID) | ORAL | Status: DC
  Administered 2017-05-03 – 2017-05-08 (×11): 400 mg via ORAL
  Filled 2017-05-03 (×11): qty 2

## 2017-05-03 MED ORDER — POTASSIUM CHLORIDE CRYS ER 20 MEQ PO TBCR
20.0000 meq | EXTENDED_RELEASE_TABLET | Freq: Two times a day (BID) | ORAL | Status: DC
  Administered 2017-05-03 – 2017-05-04 (×2): 20 meq via ORAL
  Filled 2017-05-03 (×2): qty 1

## 2017-05-03 NOTE — Progress Notes (Signed)
Patient ID: Patricia Wagner, female   DOB: [birth year], 66 y.o.   MRN: [id number]     Primary Cardiologist: Dr. [name]  Subjective:    Remains on dobutamine and amiodarone gtt. Got IV Lasix yesterday, weight down another 5 lbs.     Feeling a lot better overall, less short of breath.  Still hypoxemic, 84% on room air this morning. Complains of soreness in left leg   Creatinine 3.79 -> 2.93 -> 2.0>1.73  Objective:   Weight Range: 263 lb 6.4 oz (119.5 kg) Body mass index is 43.83 kg/m.   Vital Signs:   Temp:  [98.4 F (36.9 C)-98.7 F (37.1 C)] 98.4 F (36.9 C) (06/10 0756) Pulse Rate:  [44-120] 44 (06/10 0756) Resp:  [13-25] 24 (06/10 0756) BP: (73-140)/(45-105) 140/105 (06/10 0756) SpO2:  [94 %-99 %] 98 % (06/10 0756) Weight:  [263 lb 6.4 oz (119.5 kg)] 263 lb 6.4 oz (119.5 kg) (06/10 0400) Last BM Date: 04/27/17  Weight change: Filed Weights   05/01/17 0412 05/02/17 0500 05/03/17 0400  Weight: 279 lb (126.6 kg) 268 lb 4.8 oz (121.7 kg) 263 lb 6.4 oz (119.5 kg)    Intake/Output:   Intake/Output Summary (Last 24 hours) at 05/03/17 0819 Last data filed at 05/03/17 0639  Gross per 24 hour  Intake           1140.4 ml  Output             3551 ml  Net          -2410.6 ml     Physical Exam: General: NAD.  Obese.  HEENT: Normal Neck: supple. JVP 10 cm. Carotids 2+ bilat; no bruits. No thyromegaly or nodule noted. CVP 13 mm Hg today Cor: PMI nondisplaced. IRRR, No M/G/R noted Lungs: clear Abdomen: soft, non-tender, distended, no HSM. No bruits or masses. +BS  Extremities: no cyanosis, clubbing, or rash. 1+ edema to knees bilaterally. Left leg sore to palpation but no erythema  Neuro: alert & orientedx3, cranial nerves grossly intact. moves all 4 extremities w/o difficulty. Affect pleasant   Telemetry: Personally reviewed, afib increased this am to 120s. Up to 150 with activity.  Labs: CBC No results for input(s): WBC, NEUTROABS, HGB, HCT, MCV, PLT in the last 72  hours. Basic Metabolic Panel  Recent Labs  05/01/17 0404  05/02/17 0526 05/03/17 0509  NA 144  < > 144 143  K 3.9  < > 3.5 3.3*  CL 99*  < > 96* 94*  CO2 35*  < > 38* 38*  GLUCOSE 69  < > 99 88  BUN 59*  < > 41* 30*  CREATININE 2.93*  < > 2.01* 1.73*  CALCIUM 8.6*  < > 8.1* 8.0*  MG 2.0  --   --  1.4*  PHOS 4.3  --  3.8 3.0  < > = values in this interval not displayed. Liver Function Tests  Recent Labs  05/02/17 0526 05/03/17 0509  ALBUMIN 2.7* 2.9*   No results for input(s): LIPASE, AMYLASE in the last 72 hours. Cardiac Enzymes No results for input(s): CKTOTAL, CKMB, CKMBINDEX, TROPONINI in the last 72 hours.  BNP: BNP (last 3 results)  Recent Labs  10/31/16 1246 11/20/16 1121 04/29/17 0935  BNP 1,218.8* 1,555.6* 2,712.1*    ProBNP (last 3 results) No results for input(s): PROBNP in the last 8760 hours.   D-Dimer No results for input(s): DDIMER in the last 72 hours. Hemoglobin A1C No results for input(s): HGBA1C in the last  72 hours. Fasting Lipid Panel No results for input(s): CHOL, HDL, LDLCALC, TRIG, CHOLHDL, LDLDIRECT in the last 72 hours. Thyroid Function Tests No results for input(s): TSH, T4TOTAL, T3FREE, THYROIDAB in the last 72 hours.  Invalid input(s): FREET3  Other results:  Imaging/Studies:  No results found.  Medications:     Scheduled Medications: . amiodarone  400 mg Oral BID  . carvedilol  6.25 mg Oral BID WC  . Chlorhexidine Gluconate Cloth  6 each Topical Q0600  . furosemide  80 mg Intravenous BID  . isosorbide-hydrALAZINE  0.5 tablet Oral TID  . rosuvastatin  10 mg Oral Daily  . sodium chloride flush  10-40 mL Intracatheter Q12H  . sodium chloride flush  3 mL Intravenous Q12H  . Warfarin - Pharmacist Dosing Inpatient   Does not apply q1800    Infusions: . sodium chloride    . DOBUTamine 5 mcg/kg/min (05/03/17 0400)    PRN Medications: sodium chloride, acetaminophen, ondansetron (ZOFRAN) IV, sodium chloride flush,  sodium chloride flush   Assessment/Plan   1. Acute on chronic biventricular CHF: TEE 03/25/17 with LVEF 15% and severely reduced RV function.  She is diuresing very well with Lasix + dobutamine 2.5.  Co-ox 72% today.  CVP 13 mm Hg today.  She still appears volume overloaded on exam with JVD, peripheral edema, and hypoxemia. Creatinine continues to fall.  Weight is down 25 lbs since admission. I/O negative 1.9 liters yesterday. - Will continue IV lasix today - Continue dobutamine 2.5 mcg/kg/min for now.  Good co-ox.  Will attempt weaning when fully diuresed.    - I think we can resume Coreg at this point to help with rate control of Afib. Consider digoxin but concerned about using with fluctuating renal function. - Continue Bidil 1/2 tab tid.  2. Persistent Afib: Failed multiple DC-CV. Last attempt for DCCV 03/25/17 but had stopped her amiodarone. INR therapeutic.  - patient has received 3 days of IV amiodarone. Will transition to po.  Will need to consider DCCV prior to d/c once diuresed, probably early next week.   3. Acute hypercapnic respiratory failure: Suspect OSA at baseline.  Continue CPAP with sleeping.  - Needs sleep study. + Snoring and body habitus pre-disposes for OSA/OHS. 4. 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  Creatinine improving with inotrope and diuresis. Down to 1.73 today.  5. COPD: Suspected.  This may play a role in her hypoxemia.  6. PT recommend SNF.    Collier Salina [facility] 05/03/2017

## 2017-05-03 NOTE — Progress Notes (Signed)
ANTICOAGULATION CONSULT NOTE - Initial Consult  Pharmacy Consult for Coumadin Indication: atrial fibrillation  Allergies  Allergen Reactions  . Ace Inhibitors Palpitations   Patient Measurements: Height: 5\' 5"  (165.1 cm) Weight: 263 lb 6.4 oz (119.5 kg) IBW/kg (Calculated) : 57 Heparin Dosing Weight: n/a  Vital Signs: Temp: 98.6 F (37 C) (06/10 1230) Temp Source: Oral (06/10 1230) BP: 124/66 (06/10 1230) Pulse Rate: 88 (06/10 1230)  Labs:  Recent Labs  05/01/17 0404 05/01/17 1150 05/02/17 0526 05/03/17 0509  LABPROT 33.6*  --  30.2* 26.1*  INR 3.22  --  2.81 2.35  CREATININE 2.93* 2.60* 2.01* 1.73*   Estimated Creatinine Clearance: 43.1 mL/min (A) (by C-G formula based on SCr of 1.73 mg/dL (H)).  Medical History: Past Medical History:  Diagnosis Date  . Arthritis   . Atrial fibrillation ([place])    a. s/p multiple cardioversions; failed tikosyn/sotalol.  Marland Kitchen CAD in native artery, s/p cardiac cath with non obstructive CAD 10/24/2016  . CHF (congestive heart failure) ([place])   . DIVERTICULITIS, HX OF 07/25/2007  . DIVERTICULOSIS, COLON 07/22/2007  . Dyspnea   . Edema, peripheral    a. chronic BLE edema, R>L. Prior trauma from dog attack and accident.  Marland Kitchen HYPERLIPIDEMIA 02/03/2008  . Hypersomnia    declines w/u  . Hypertension   . MENOPAUSAL DISORDER 01/09/2011  . Morbid obesity ([place]) 07/22/2007  . NICM (nonischemic cardiomyopathy) ([place]) 10/24/2016  . Raynaud's syndrome 07/22/2007  . Stroke ([place]) 2017  . THYROID NODULE, RIGHT 01/04/2010  . VITAMIN D DEFICIENCY 01/09/2011   Qualifier: Diagnosis of  By: [name], [name]     Medications:  Scheduled:  . amiodarone  400 mg Oral BID  . carvedilol  6.25 mg Oral BID WC  . Chlorhexidine Gluconate Cloth  6 each Topical Q0600  . furosemide  80 mg Intravenous BID  . isosorbide-hydrALAZINE  0.5 tablet Oral TID  . rosuvastatin  10 mg Oral Daily  . sodium chloride flush  10-40 mL Intracatheter Q12H  . sodium chloride flush  3 mL  Intravenous Q12H  . warfarin  3 mg Oral ONCE-1800  . Warfarin - Pharmacist Dosing Inpatient   Does not apply q1800   Assessment: 66 yo female on chronic Coumadin for afib.  Admitted with supratherapeutic INR, continuing to fall despite warfarin reinitiation on 6/8 (3.2>2.8>2.3).  No bleeding or complications noted.  Would like to keep INR > 2 given plan for potential cardioversion soon.  PTA Coumadin dose 3 mg daily, although dose had been reduced at last several outpatient visits. May need less than this at discharge.  Goal of Therapy:  INR 2-3 Monitor platelets by anticoagulation protocol: Yes   Plan:  1. Warfarin 3mg  x1 tonight 2. Daily PT/INR.  Georga Bora, PharmD Clinical Pharmacist 05/03/2017 12:57 PM

## 2017-05-03 NOTE — Evaluation (Signed)
Occupational Therapy Evaluation Patient Details Name: Patricia Wagner MRN: [id number] DOB: [birth year] Today's Date: 05/03/2017    History of Present Illness 65 admitted with respiratory failure and heart failure. PMHx: CHF, AFib, cKD, chronic cor pulmonale, CAD, ICM, obestity, CVA   Clinical Impression   PTA, pt was independent with ADL and functional mobility and living alone. Pt currently requires min guard assist overall for ADL participation in order to maintain safety with ADL participation. Pt demonstrates some difficulty with following commands at times requiring VC's to ensure safety and prevent impulsivity. She presents with decreased activity tolerance for ADL as well as generalized weakness impacting her ability to participate in ADL at [facility]. SpO2 ranged from 93-100% on 3L O2 via [place] throughout ADL participation. Pt would benefit from continued OT services while admitted in order to improve independence with ADL and functional mobility. Recommend short-term SNF level rehabilitation in order to maximize independence and safety prior to return home. OT will continue to follow acutely.    Follow Up Recommendations  SNF;Supervision/Assistance - 24 hour    Equipment Recommendations  Other (comment);Tub/shower bench;3 in 1 bedside commode (TBD at next venue of care)    Recommendations for Other Services       Precautions / Restrictions Precautions Precautions: Fall Precaution Comments: watch O2 sats Restrictions Weight Bearing Restrictions: No      Mobility Bed Mobility Overal bed mobility: Needs Assistance Bed Mobility: Supine to Sit     Supine to sit: Supervision        Transfers Overall transfer level: Needs assistance Equipment used: None Transfers: Sit to/from Stand Sit to Stand: Min guard         General transfer comment: Min guard for safety with cues for safe use of RW.    Balance Overall balance assessment: Needs assistance Sitting-balance support: Feet  supported;No upper extremity supported Sitting balance-Leahy Scale: Good     Standing balance support: Bilateral upper extremity supported;Single extremity supported;No upper extremity supported;During functional activity Standing balance-Leahy Scale: Fair Standing balance comment: Able to statically stand briefly without UE support for grooming tasks at sink.                            ADL either performed or assessed with clinical judgement   ADL Overall ADL's : Needs assistance/impaired Eating/Feeding: Set up;Sitting   Grooming: Wash/dry hands;Min guard;Standing   Upper Body Bathing: Min guard;Sitting   Lower Body Bathing: Min guard;Sit to/from stand   Upper Body Dressing : Sitting;Min guard   Lower Body Dressing: Min guard;Sit to/from stand   Toilet Transfer: Ambulation;RW;Min guard   Toileting- Water quality scientist and Hygiene: Min guard;Sit to/from stand       Functional mobility during ADLs: Min guard;Rolling walker;Cueing for safety General ADL Comments: Pt requiring VC's for safety throughout. SpO2 maintained 93-100% on 3L O2 via [place] during ADL tasks in room. Pt with episode of incontinence of bowel during session while ambulating to bathroom with little awareness of this until seeing it on the floor.      Vision Patient Visual Report: No change from baseline Vision Assessment?: No apparent visual deficits     Perception     Praxis      Pertinent Vitals/Pain Pain Assessment: No/denies pain     Hand Dominance     Extremity/Trunk Assessment Upper Extremity Assessment Upper Extremity Assessment: Generalized weakness   Lower Extremity Assessment Lower Extremity Assessment: Generalized weakness  Communication Communication Communication: No difficulties   Cognition Arousal/Alertness: Awake/alert   Overall Cognitive Status: Impaired/Different from baseline Area of Impairment: Safety/judgement;Following commands;Attention;Awareness                    Current Attention Level: Selective   Following Commands: Follows one step commands inconsistently Safety/Judgement: Decreased awareness of safety;Decreased awareness of deficits Awareness: Emergent   General Comments: Pt requiring multimodal cues to follow one-step commands at times. Pt with bowel movement on floor and did not demonstrate awareness of this until after it had occurred.   General Comments       Exercises     Shoulder Instructions      Home Living Family/patient expects to be discharged to:: Private residence Living Arrangements: Alone Available Help at Discharge: Family;Available PRN/intermittently Type of Home: Apartment Home Access: Level entry     Home Layout: One level     Bathroom Shower/Tub: Teacher, early years/pre: Handicapped height     Home Equipment: Cane - single point          Prior Functioning/Environment Level of Independence: Independent                 OT Problem List: Decreased strength;Decreased activity tolerance;Impaired balance (sitting and/or standing);Decreased safety awareness;Decreased cognition;Decreased knowledge of use of DME or AE;Decreased knowledge of precautions;Cardiopulmonary status limiting activity      OT Treatment/Interventions: Self-care/ADL training;Therapeutic exercise;Energy conservation;DME and/or AE instruction;Cognitive remediation/compensation;Patient/family education;Therapeutic activities    OT Goals(Current goals can be found in the care plan section) Acute Rehab OT Goals Patient Stated Goal: return home OT Goal Formulation: With patient Time For Goal Achievement: 05/17/17 Potential to Achieve Goals: Good ADL Goals Pt Will Perform Grooming: with modified independence;standing Pt Will Perform Upper Body Dressing: with modified independence;standing Pt Will Perform Lower Body Dressing: with modified independence;sit to/from stand Pt Will Transfer to Toilet: with  modified independence;ambulating (handicapped height toilet) Pt Will Perform Toileting - Clothing Manipulation and hygiene: with modified independence;sit to/from stand Pt Will Perform Tub/Shower Transfer: Tub transfer;tub bench;ambulating;rolling walker Pt/caregiver will Perform Home Exercise Program: Increased strength;Both right and left upper extremity;With written HEP provided;Independently Additional ADL Goal #1: Pt will independently incorporate 3 strategies to conserve energy during daily ADL routine.  OT Frequency: Min 2X/week   Barriers to D/C:            Co-evaluation              AM-PAC PT "6 Clicks" Daily Activity     Outcome Measure Help from another person eating meals?: A Little Help from another person taking care of personal grooming?: A Little Help from another person toileting, which includes using toliet, bedpan, or urinal?: A Little Help from another person bathing (including washing, rinsing, drying)?: A Little Help from another person to put on and taking off regular upper body clothing?: A Little Help from another person to put on and taking off regular lower body clothing?: A Little 6 Click Score: 18   End of Session Equipment Utilized During Treatment: Gait belt;Rolling walker;Oxygen (3L O2 via ) Nurse Communication: Mobility status  Activity Tolerance:   Patient left: in chair;with call bell/phone within reach;with family/visitor present  OT Visit Diagnosis: Unsteadiness on feet (R26.81);Muscle weakness (generalized) (M62.81)                Time: 6433-2951 OT Time Calculation (min): 32 min Charges:  OT General Charges $OT Visit: 1 Procedure OT Evaluation $OT Eval Moderate Complexity: 1  Procedure OT Treatments $Self Care/Home Management : 8-22 mins G-Codes:     Norman Herrlich, MS OTR/L  Pager: [place] A Patricia Wagner 05/03/2017, 5:24 PM

## 2017-05-04 ENCOUNTER — Ambulatory Visit (HOSPITAL_COMMUNITY): Payer: Managed Care, Other (non HMO) | Admitting: Nurse Practitioner

## 2017-05-04 DIAGNOSIS — I480 Paroxysmal atrial fibrillation: Secondary | ICD-10-CM

## 2017-05-04 LAB — RENAL FUNCTION PANEL
Albumin: 2.8 g/dL — ABNORMAL LOW (ref 3.5–5.0)
Anion gap: 8 (ref 5–15)
BUN: 23 mg/dL — AB (ref 6–20)
CHLORIDE: 92 mmol/L — AB (ref 101–111)
CO2: 42 mmol/L — ABNORMAL HIGH (ref 22–32)
CREATININE: 1.62 mg/dL — AB (ref 0.44–1.00)
Calcium: 8 mg/dL — ABNORMAL LOW (ref 8.9–10.3)
GFR calc Af Amer: 38 mL/min — ABNORMAL LOW (ref >60)
GFR, EST NON AFRICAN AMERICAN: 33 mL/min — AB (ref >60)
GLUCOSE: 108 mg/dL — AB (ref 65–99)
POTASSIUM: 3.4 mmol/L — AB (ref 3.5–5.1)
Phosphorus: 2.8 mg/dL (ref 2.5–4.6)
Sodium: 142 mmol/L (ref 135–145)

## 2017-05-04 LAB — COOXEMETRY PANEL
CARBOXYHEMOGLOBIN: 2.3 % — AB (ref 0.5–1.5)
CARBOXYHEMOGLOBIN: 1.9 % — AB (ref 0.5–1.5)
METHEMOGLOBIN: 0.7 % (ref 0.0–1.5)
METHEMOGLOBIN: 0.9 % (ref 0.0–1.5)
O2 SAT: 61.4 %
O2 Saturation: 72 %
TOTAL HEMOGLOBIN: 13.5 g/dL (ref 12.0–16.0)
Total hemoglobin: 14.1 g/dL (ref 12.0–16.0)

## 2017-05-04 LAB — CBC
HCT: 44.4 % (ref 36.0–46.0)
Hemoglobin: 13.2 g/dL (ref 12.0–15.0)
MCH: 27.5 pg (ref 26.0–34.0)
MCHC: 29.7 g/dL — ABNORMAL LOW (ref 30.0–36.0)
MCV: 92.5 fL (ref 78.0–100.0)
PLATELETS: 110 10*3/uL — AB (ref 150–400)
RBC: 4.8 MIL/uL (ref 3.87–5.11)
RDW: 16.6 % — ABNORMAL HIGH (ref 11.5–15.5)
WBC: 6.9 10*3/uL (ref 4.0–10.5)

## 2017-05-04 LAB — PROTIME-INR
INR: 2.48
Prothrombin Time: 27.3 seconds — ABNORMAL HIGH (ref 11.4–15.2)

## 2017-05-04 MED ORDER — FUROSEMIDE 10 MG/ML IJ SOLN
80.0000 mg | Freq: Two times a day (BID) | INTRAMUSCULAR | Status: DC
Start: 2017-05-04 — End: 2017-05-04

## 2017-05-04 MED ORDER — POTASSIUM CHLORIDE CRYS ER 20 MEQ PO TBCR
40.0000 meq | EXTENDED_RELEASE_TABLET | Freq: Two times a day (BID) | ORAL | Status: DC
  Administered 2017-05-04 – 2017-05-12 (×17): 40 meq via ORAL
  Filled 2017-05-04 (×17): qty 2

## 2017-05-04 MED ORDER — WARFARIN SODIUM 2 MG PO TABS
3.0000 mg | ORAL_TABLET | Freq: Once | ORAL | Status: AC
  Administered 2017-05-04: 3 mg via ORAL
  Filled 2017-05-04: qty 1.5

## 2017-05-04 MED ORDER — FUROSEMIDE 10 MG/ML IJ SOLN
80.0000 mg | Freq: Once | INTRAMUSCULAR | Status: AC
  Administered 2017-05-04: 80 mg via INTRAVENOUS
  Filled 2017-05-04: qty 8

## 2017-05-04 MED ORDER — FUROSEMIDE 10 MG/ML IJ SOLN
80.0000 mg | Freq: Two times a day (BID) | INTRAMUSCULAR | Status: DC
  Administered 2017-05-04 – 2017-05-05 (×2): 80 mg via INTRAVENOUS
  Filled 2017-05-04 (×2): qty 8

## 2017-05-04 NOTE — Progress Notes (Signed)
Physical Therapy Treatment Patient Details Name: Patricia Wagner MRN: [id number] DOB: 06/14/1951 Today's Date: 05/04/2017    History of Present Illness 65 admitted with respiratory failure and heart failure. PMHx: CHF, AFib, cKD, chronic cor pulmonale, CAD, ICM, obestity, CVA    PT Comments    Patient with improved activity tolerance today, ambulating 130'.  Did need to increased O2 to 3L to maintain O2 saturation.  Making improvements with mobility and gait.   Follow Up Recommendations  SNF;Supervision for mobility/OOB     Equipment Recommendations  Rolling walker with 5" wheels    Recommendations for Other Services       Precautions / Restrictions Precautions Precautions: Fall Precaution Comments: watch O2 sats Restrictions Weight Bearing Restrictions: No    Mobility  Bed Mobility Overal bed mobility: Needs Assistance Bed Mobility: Supine to Sit;Sit to Supine     Supine to sit: Supervision Sit to supine: Supervision   General bed mobility comments: Supervision for safety  Transfers Overall transfer level: Needs assistance Equipment used: Straight cane;Rolling walker (2 wheeled) Transfers: Sit to/from Stand Sit to Stand: Min guard         General transfer comment: Initially wanted to try cane.  Slightly unsteady with transfer from bed.  Patient then used RW for sit <> stand from toilet and to bed.  More steady with RW.  Ambulation/Gait Ambulation/Gait assistance: Min guard Ambulation Distance (Feet): 130 Feet Assistive device: Straight cane;Rolling walker (2 wheeled) Gait Pattern/deviations: Step-through pattern;Decreased stride length;Trunk flexed;Shuffle Gait velocity: decreased Gait velocity interpretation: Below normal speed for age/gender General Gait Details: Patient declined RW and asked to use cane initially.  Reviewed recommendations of prior PT, but patient wanted to try cane.  Patient ambulated with cane for 12' with decreased balance, staggerring to  both sides.  Patient then agreed to use RW for safety.  Able to ambulate 42' with RW with improved balance.   Patient ambulating on O2 at 2L/min and O2 sats dropped to 85%.  Increased O2 to 3L and O2 sats returned to 90's.  Returned to 2L once in bed.   Stairs            Wheelchair Mobility    Modified Rankin (Stroke Patients Only)       Balance Overall balance assessment: Needs assistance Sitting-balance support: Feet supported;No upper extremity supported Sitting balance-Leahy Scale: Good     Standing balance support: Bilateral upper extremity supported Standing balance-Leahy Scale: Poor Standing balance comment: Requires UE support for dynamic activities                            Cognition Arousal/Alertness: Awake/alert Behavior During Therapy: Impulsive Overall Cognitive Status: Impaired/Different from baseline Area of Impairment: Safety/judgement;Awareness                         Safety/Judgement: Decreased awareness of safety;Decreased awareness of deficits     General Comments: Asks PT "don't touch me" as PT attempts to assist patient.  Impulsive.      Exercises      General Comments        Pertinent Vitals/Pain Pain Assessment: No/denies pain    Home Living                      Prior Function            PT Goals (current goals can now be found in the care  plan section) Acute Rehab PT Goals Patient Stated Goal: return home Progress towards PT goals: Progressing toward goals    Frequency    Min 3X/week      PT Plan Current plan remains appropriate    Co-evaluation              AM-PAC PT "6 Clicks" Daily Activity  Outcome Measure  Difficulty turning over in bed (including adjusting bedclothes, sheets and blankets)?: None Difficulty moving from lying on back to sitting on the side of the bed? : Total Difficulty sitting down on and standing up from a chair with arms (e.g., wheelchair, bedside  commode, etc,.)?: A Little Help needed moving to and from a bed to chair (including a wheelchair)?: A Little Help needed walking in hospital room?: A Little Help needed climbing 3-5 steps with a railing? : A Lot 6 Click Score: 16    End of Session Equipment Utilized During Treatment: Gait belt;Oxygen Activity Tolerance: Patient tolerated treatment well (With increase in O2 during gait.) Patient left: in bed;with call bell/phone within reach   PT Visit Diagnosis: Unsteadiness on feet (R26.81);Other abnormalities of gait and mobility (R26.89);Difficulty in walking, not elsewhere classified (R26.2);Muscle weakness (generalized) (M62.81)     Time: 9675-9163 PT Time Calculation (min) (ACUTE ONLY): 36 min  Charges:  $Gait Training: 23-37 mins                    G Codes:       Carita Pian. Sanjuana Kava, [facility] Pager [place] 05/04/2017, 7:10 PM

## 2017-05-04 NOTE — Plan of Care (Signed)
Problem: Activity: Goal: Capacity to carry out activities will improve Outcome: Progressing Patient ambulated about 100 feet with walker and no assist from nurse. Stopped for breaks but O2 sats remained adequate on 2L nasal cannula. HR did elevate to 150s but returned to 90-100s concluding walk.

## 2017-05-04 NOTE — Progress Notes (Signed)
[place] for Coumadin Indication: atrial fibrillation  Allergies  Allergen Reactions  . Ace Inhibitors Palpitations   Patient Measurements: Height: 5\' 5"  (165.1 cm) Weight: 259 lb 7.7 oz (117.7 kg) IBW/kg (Calculated) : 57 Heparin Dosing Weight: n/a  Vital Signs: Temp: 99 F (37.2 C) (06/11 0900) Temp Source: Oral (06/11 0900) BP: 114/69 (06/11 0326) Pulse Rate: 78 (06/11 0756)  Labs:  Recent Labs  05/02/17 0526 05/03/17 0509 05/04/17 0330  HGB  --   --  13.2  HCT  --   --  44.4  PLT  --   --  110*  LABPROT 30.2* 26.1* 27.3*  INR 2.81 2.35 2.48  CREATININE 2.01* 1.73* 1.62*   Estimated Creatinine Clearance: 45.6 mL/min (A) (by C-G formula based on SCr of 1.62 mg/dL (H)).  Medical History: Past Medical History:  Diagnosis Date  . Arthritis   . Atrial fibrillation ([place])    a. s/p multiple cardioversions; failed tikosyn/sotalol.  Marland Kitchen CAD in native artery, s/p cardiac cath with non obstructive CAD 10/24/2016  . CHF (congestive heart failure) ([place])   . DIVERTICULITIS, HX OF 07/25/2007  . DIVERTICULOSIS, COLON 07/22/2007  . Dyspnea   . Edema, peripheral    a. chronic BLE edema, R>L. Prior trauma from dog attack and accident.  Marland Kitchen HYPERLIPIDEMIA 02/03/2008  . Hypersomnia    declines w/u  . Hypertension   . MENOPAUSAL DISORDER 01/09/2011  . Morbid obesity ([place]) 07/22/2007  . NICM (nonischemic cardiomyopathy) ([place]) 10/24/2016  . Raynaud's syndrome 07/22/2007  . Stroke ([place]) 2017  . THYROID NODULE, RIGHT 01/04/2010  . VITAMIN D DEFICIENCY 01/09/2011   Qualifier: Diagnosis of  By: [name], [name]     Medications:  Scheduled:  . amiodarone  400 mg Oral BID  . Chlorhexidine Gluconate Cloth  6 each Topical Q0600  . isosorbide-hydrALAZINE  0.5 tablet Oral TID  . potassium chloride  20 mEq Oral BID  . rosuvastatin  10 mg Oral Daily  . sodium chloride flush  10-40 mL Intracatheter Q12H  . sodium chloride flush  3 mL Intravenous Q12H  .  Warfarin - Pharmacist Dosing Inpatient   Does not apply q1800   Assessment: 66 yo female on chronic Coumadin for afib.  Admitted with supratherapeutic INR, continuing to fall despite warfarin reinitiation on 6/8 (3.2>2.8>2.3).  No bleeding or complications noted.  Would like to keep INR > 2 given plan for potential cardioversion soon.  PTA Coumadin dose 3 mg daily, although dose had been reduced at last several outpatient visits. May need less than this at discharge.  Goal of Therapy:  INR 2-3 Monitor platelets by anticoagulation protocol: Yes   Plan:  1. Warfarin 3mg  x1 tonight 2. Daily PT/INR.  Uvaldo Rising, BCPS  Clinical Pharmacist Pager [phone]  05/04/2017 9:43 AM

## 2017-05-04 NOTE — Progress Notes (Signed)
CARDIAC REHAB PHASE I   Offered to walk with pt, pt eating lunch. Pt scheduled to work with PT this afternoon, will follow-up tomorrow.   Lenna Sciara, RN, BSN 05/04/2017 2:10 PM

## 2017-05-04 NOTE — Progress Notes (Signed)
Patient ID: Patricia Wagner, female   DOB: 1951-05-14, 66 y.o.   MRN: [id number]     Primary Cardiologist: Dr. [name]  Subjective:    Admitted 04/29/17 with acute respiratory distress in the setting of atrial fibrillation and volume overload. Hypotensive, started on dobutamine, Amio gtt and IV diuresis. Weight down 29 pounds.   Transitioned to po Amio yesterday. CVP 12  Creatinine improving, down to 1.62. Co ox 72%. BP's soft.   Feels ok today, sitting up in the chair and has walked down the hall. She is still slightly SOB at rest and with talking. Denies chest pain and palpitations.   Objective:   Weight Range: 259 lb 7.7 oz (117.7 kg) Body mass index is 43.18 kg/m.   Vital Signs:   Temp:  [98.1 F (36.7 C)-98.7 F (37.1 C)] 98.7 F (37.1 C) (06/11 0326) Pulse Rate:  [43-88] 87 (06/11 0326) Resp:  [14-27] 19 (06/11 0326) BP: (97-140)/(64-105) 114/69 (06/11 0326) SpO2:  [93 %-98 %] 93 % (06/11 0326) Weight:  [259 lb 7.7 oz (117.7 kg)] 259 lb 7.7 oz (117.7 kg) (06/11 0330) Last BM Date: 05/03/17  Weight change: Filed Weights   05/02/17 0500 05/03/17 0400 05/04/17 0330  Weight: 268 lb 4.8 oz (121.7 kg) 263 lb 6.4 oz (119.5 kg) 259 lb 7.7 oz (117.7 kg)    Intake/Output:   Intake/Output Summary (Last 24 hours) at 05/04/17 0734 Last data filed at 05/04/17 0700  Gross per 24 hour  Intake           1867.5 ml  Output             3450 ml  Net          -1582.5 ml     Physical Exam: General:Obese female, NAD, sitting up in the chair. HEENT: normal Neck: supple. JVP 8-9 cm.  Carotids 2+ bilat; no bruits. No thyromegaly or nodule noted. Cor: PMI nondisplaced.Tachy, irregularly irregular, No M/G/R noted Lungs:Clear in upper lobes bilaterally, crackles in bilateral bases. Normal effort.  Abdomen: soft, non-tender, distended, no HSM. No bruits or masses. +BS  Extremities: no cyanosis, clubbing, rash, 3+edema to knees.  Neuro: alert & orientedx3, cranial nerves grossly intact.  moves all 4 extremities w/o difficulty. Affect pleasant   Telemetry:Afib, rates as high as 130 at times.  Labs: CBC  Recent Labs  05/04/17 0330  WBC 6.9  HGB 13.2  HCT 44.4  MCV 92.5  PLT 202*   Basic Metabolic Panel  Recent Labs  05/03/17 0509 05/04/17 0330  NA 143 142  K 3.3* 3.4*  CL 94* 92*  CO2 38* 42*  GLUCOSE 88 108*  BUN 30* 23*  CREATININE 1.73* 1.62*  CALCIUM 8.0* 8.0*  MG 1.4*  --   PHOS 3.0 2.8   Liver Function Tests  Recent Labs  05/03/17 0509 05/04/17 0330  ALBUMIN 2.9* 2.8*   No results for input(s): LIPASE, AMYLASE in the last 72 hours. Cardiac Enzymes No results for input(s): CKTOTAL, CKMB, CKMBINDEX, TROPONINI in the last 72 hours.  BNP: BNP (last 3 results)  Recent Labs  10/31/16 1246 11/20/16 1121 04/29/17 0935  BNP 1,218.8* 1,555.6* 2,712.1*    ProBNP (last 3 results) No results for input(s): PROBNP in the last 8760 hours.   D-Dimer No results for input(s): DDIMER in the last 72 hours. Hemoglobin A1C No results for input(s): HGBA1C in the last 72 hours. Fasting Lipid Panel No results for input(s): CHOL, HDL, LDLCALC, TRIG, CHOLHDL, LDLDIRECT in the last 72  hours. Thyroid Function Tests No results for input(s): TSH, T4TOTAL, T3FREE, THYROIDAB in the last 72 hours.  Invalid input(s): FREET3  Other results:  Imaging/Studies: Transesophageal Echocardiography 03/25/17 Study Conclusions  - Left ventricle: Systolic function was severely reduced. The   estimated ejection fraction was 15%. Diffuse hypokinesis. - Aortic valve: No evidence of vegetation. - Mitral valve: No evidence of vegetation. There was mild to   moderate regurgitation. - Left atrium: The atrium was moderately dilated. No evidence of   thrombus in the atrial cavity or appendage. - Right ventricle: The cavity size was mildly dilated. Systolic   function was severely reduced. - Right atrium: The atrium was mildly dilated. - Atrial septum: No defect or  patent foramen ovale was identified. - Tricuspid valve: No evidence of vegetation. There was moderate   regurgitation. - Pulmonic valve: No evidence of vegetation. - Pericardium, extracardiac: A trivial pericardial effusion was   identified.  Impressions:  - Severe LV dysfunction; mild to moderate MR; moderate LAE; no LAA   thrombus; mild RAE and RVE; severely reduced RV function;   moderate TR.   Medications:     Scheduled Medications: . amiodarone  400 mg Oral BID  . carvedilol  6.25 mg Oral BID WC  . Chlorhexidine Gluconate Cloth  6 each Topical Q0600  . furosemide  80 mg Intravenous BID  . isosorbide-hydrALAZINE  0.5 tablet Oral TID  . potassium chloride  20 mEq Oral BID  . rosuvastatin  10 mg Oral Daily  . sodium chloride flush  10-40 mL Intracatheter Q12H  . sodium chloride flush  3 mL Intravenous Q12H  . Warfarin - Pharmacist Dosing Inpatient   Does not apply q1800    Infusions: . sodium chloride    . DOBUTamine 5 mcg/kg/min (05/04/17 0700)    PRN Medications: sodium chloride, acetaminophen, ondansetron (ZOFRAN) IV, sodium chloride flush, sodium chloride flush   Assessment/Plan   1. Acute on chronic biventricular CHF: TEE 03/25/17 with LVEF 15% and severely reduced RV function.  - CVP 12-13, will continue IV lasix today.  - She was on 4mcg of dobutamine this morning and overnight, order says 2.35mcg. RN turned down to correct dose. Will repeat Co ox this afternoon.  - Stop Coreg  - Continue Bidil 1/2 tab tid.  2. Persistent Afib: Failed multiple DC-CV. Last attempt for DCCV 03/25/17 but had stopped her amiodarone. INR therapeutic.  - Transitioned to po Amio yesterday.  - Continue Amio 400mg  BID.  - Will set up DCCV for tomorrow.  3. Acute hypercapnic respiratory failure: Suspect OSA at baseline.  Continue CPAP with sleeping.  - Needs sleep study. + Snoring and body habitus pre-disposes for OSA/OHS. - no change to current plan.  4. AKI on CKD II-III: Suspect  cardiorenal (hemodynamically-mediated).  Creatinine improving with inotrope and diuresis.  - Creatinine 1.62 today 5. COPD: Suspected.  This may play a role in her hypoxemia.  - No change to current plan.  6. PT recommend SNF.   - No change to current plan.   Arbutus Leas NP-C  05/04/2017   Patient seen and examined with Jettie Booze, NP. We discussed all aspects of the encounter. I agree with the assessment and plan as stated above.   She remains quite tenuous. Volume status remains elevated. Co-ox ok. Will continue dobutamine and IV lasix. No spots available for DC-CV until Wednesday. Continue amiodarone and warfarin. Place TED hose. CR to see. Warfarin dosing d/w PharmD.   Glori Bickers, MD  7:33  PM

## 2017-05-05 LAB — CBC
HEMATOCRIT: 44.2 % (ref 36.0–46.0)
Hemoglobin: 13 g/dL (ref 12.0–15.0)
MCH: 26.9 pg (ref 26.0–34.0)
MCHC: 29.4 g/dL — AB (ref 30.0–36.0)
MCV: 91.5 fL (ref 78.0–100.0)
Platelets: 107 10*3/uL — ABNORMAL LOW (ref 150–400)
RBC: 4.83 MIL/uL (ref 3.87–5.11)
RDW: 16.2 % — AB (ref 11.5–15.5)
WBC: 7.4 10*3/uL (ref 4.0–10.5)

## 2017-05-05 LAB — PROTIME-INR
INR: 2.25
Prothrombin Time: 25.2 seconds — ABNORMAL HIGH (ref 11.4–15.2)

## 2017-05-05 LAB — RENAL FUNCTION PANEL
ALBUMIN: 2.7 g/dL — AB (ref 3.5–5.0)
ANION GAP: 10 (ref 5–15)
BUN: 22 mg/dL — ABNORMAL HIGH (ref 6–20)
CALCIUM: 8.3 mg/dL — AB (ref 8.9–10.3)
CO2: 41 mmol/L — AB (ref 22–32)
Chloride: 90 mmol/L — ABNORMAL LOW (ref 101–111)
Creatinine, Ser: 1.49 mg/dL — ABNORMAL HIGH (ref 0.44–1.00)
GFR calc non Af Amer: 36 mL/min — ABNORMAL LOW (ref >60)
GFR, EST AFRICAN AMERICAN: 42 mL/min — AB (ref >60)
Glucose, Bld: 99 mg/dL (ref 65–99)
PHOSPHORUS: 2.3 mg/dL — AB (ref 2.5–4.6)
Potassium: 3.5 mmol/L (ref 3.5–5.1)
SODIUM: 141 mmol/L (ref 135–145)

## 2017-05-05 LAB — COOXEMETRY PANEL
Carboxyhemoglobin: 2.3 % — ABNORMAL HIGH (ref 0.5–1.5)
Methemoglobin: 1 % (ref 0.0–1.5)
O2 Saturation: 74.4 %
TOTAL HEMOGLOBIN: 13 g/dL (ref 12.0–16.0)

## 2017-05-05 MED ORDER — FUROSEMIDE 10 MG/ML IJ SOLN
80.0000 mg | Freq: Once | INTRAMUSCULAR | Status: AC
  Administered 2017-05-05: 80 mg via INTRAVENOUS
  Filled 2017-05-05: qty 8

## 2017-05-05 MED ORDER — WARFARIN SODIUM 5 MG PO TABS
5.0000 mg | ORAL_TABLET | Freq: Once | ORAL | Status: AC
  Administered 2017-05-05: 5 mg via ORAL
  Filled 2017-05-05: qty 1

## 2017-05-05 NOTE — Progress Notes (Signed)
[place] for Coumadin Indication: atrial fibrillation  Allergies  Allergen Reactions  . Ace Inhibitors Palpitations   Patient Measurements: Height: 5\' 5"  (165.1 cm) Weight: 262 lb (118.8 kg) IBW/kg (Calculated) : 57 Heparin Dosing Weight: n/a  Vital Signs: Temp: 98.3 F (36.8 C) (06/12 0827) Temp Source: Oral (06/12 0827) BP: 113/75 (06/12 0827) Pulse Rate: 93 (06/12 0500)  Labs:  Recent Labs  05/03/17 0509 05/04/17 0330 05/05/17 0450  HGB  --  13.2 13.0  HCT  --  44.4 44.2  PLT  --  110* 107*  LABPROT 26.1* 27.3* 25.2*  INR 2.35 2.48 2.25  CREATININE 1.73* 1.62* 1.49*   Estimated Creatinine Clearance: 49.8 mL/min (A) (by C-G formula based on SCr of 1.49 mg/dL (H)).  Medical History: Past Medical History:  Diagnosis Date  . Arthritis   . Atrial fibrillation ([place])    a. s/p multiple cardioversions; failed tikosyn/sotalol.  Marland Kitchen CAD in native artery, s/p cardiac cath with non obstructive CAD 10/24/2016  . CHF (congestive heart failure) ([place])   . DIVERTICULITIS, HX OF 07/25/2007  . DIVERTICULOSIS, COLON 07/22/2007  . Dyspnea   . Edema, peripheral    a. chronic BLE edema, R>L. Prior trauma from dog attack and accident.  Marland Kitchen HYPERLIPIDEMIA 02/03/2008  . Hypersomnia    declines w/u  . Hypertension   . MENOPAUSAL DISORDER 01/09/2011  . Morbid obesity ([place]) 07/22/2007  . NICM (nonischemic cardiomyopathy) ([place]) 10/24/2016  . Raynaud's syndrome 07/22/2007  . Stroke ([place]) 2017  . THYROID NODULE, RIGHT 01/04/2010  . VITAMIN D DEFICIENCY 01/09/2011   Qualifier: Diagnosis of  By: [name], [name]     Medications:  Scheduled:  . amiodarone  400 mg Oral BID  . Chlorhexidine Gluconate Cloth  6 each Topical Q0600  . furosemide  80 mg Intravenous Once  . isosorbide-hydrALAZINE  0.5 tablet Oral TID  . potassium chloride  40 mEq Oral BID  . rosuvastatin  10 mg Oral Daily  . sodium chloride flush  10-40 mL Intracatheter Q12H  . sodium  chloride flush  3 mL Intravenous Q12H  . Warfarin - Pharmacist Dosing Inpatient   Does not apply q1800   Assessment: 66 yo female on chronic Coumadin for afib.  Admitted with supratherapeutic INR, continuing to fall despite warfarin reinitiation on 6/8.  No bleeding or complications noted.  Would like to keep INR > 2 given plan for potential cardioversion soon.  PTA Coumadin dose 3 mg daily, although dose had been reduced at last several outpatient visits. May need less than this at discharge.  Goal of Therapy:  INR 2-3 Monitor platelets by anticoagulation protocol: Yes   Plan:  1. Warfarin 5 mg x1 - will give early today to try to keep in range for DCCV tomorrow. 2. Daily PT/INR.  Uvaldo Rising, BCPS  Clinical Pharmacist Pager [phone]  05/05/2017 11:36 AM

## 2017-05-05 NOTE — Progress Notes (Signed)
Occupational Therapy Treatment Patient Details Name: Patricia Wagner MRN: [id number] DOB: Apr 23, 1951 Today's Date: 05/05/2017    History of present illness 35 admitted with respiratory failure and heart failure. PMHx: CHF, AFib, cKD, chronic cor pulmonale, CAD, ICM, obestity, CVA   OT comments  Pt progressing well toward OT goals but remains limited by cardiopulmonary status and generalized weakness. She was able to complete ambulating toilet transfers and standing pericare with min guard assist this session. HR varying ranging between 118-160 with mobility this session and unable to obtain accurate SpO2 reading and RN aware. Continue to recommend SNF level rehabilitation to maximize independence with ADL and functional mobility. OT will continue to follow acutely.    Follow Up Recommendations  SNF;Supervision/Assistance - 24 hour    Equipment Recommendations  Other (comment);Tub/shower bench;3 in 1 bedside commode (TBD at next venue of care)    Recommendations for Other Services      Precautions / Restrictions Precautions Precautions: Fall Precaution Comments: watch O2 sats Restrictions Weight Bearing Restrictions: No       Mobility Bed Mobility               General bed mobility comments: OOB in chair on arrival.  Transfers Overall transfer level: Needs assistance Equipment used: Straight cane;Rolling walker (2 wheeled) Transfers: Sit to/from Stand Sit to Stand: Min guard         General transfer comment: Min guard for steadying.     Balance Overall balance assessment: Needs assistance Sitting-balance support: Feet supported;No upper extremity supported Sitting balance-Leahy Scale: Good     Standing balance support: Bilateral upper extremity supported;No upper extremity supported;During functional activity Standing balance-Leahy Scale: Fair Standing balance comment: Able to statically stand without UE support for ADL tasks. Requires UE support for functional  mobility.                            ADL either performed or assessed with clinical judgement   ADL Overall ADL's : Needs assistance/impaired     Grooming: Wash/dry hands;Min Dispensing optician: Ambulation;RW;Min guard   Toileting- Water quality scientist and Hygiene: Min guard;Sit to/from stand       Functional mobility during ADLs: Min guard;Rolling walker;Cueing for safety General ADL Comments: Pt with HR spiking to 160s with wide range throughout mobility (range from 118-160) RN aware.     Vision   Vision Assessment?: No apparent visual deficits   Perception     Praxis      Cognition Arousal/Alertness: Awake/alert Behavior During Therapy: Impulsive Overall Cognitive Status: Impaired/Different from baseline Area of Impairment: Safety/judgement;Awareness;Problem solving;Attention                   Current Attention Level: Selective     Safety/Judgement: Decreased awareness of safety;Decreased awareness of deficits Awareness: Emergent Problem Solving: Slow processing General Comments: Pt requiring VC's for safety with maneuvering in room utilizing RW. Highly concerned over placement of IV pole during mobility.        Exercises     Shoulder Instructions       General Comments HR varying with increase up to 160 at one point during session with range from 118-160 throughout. RN aware.     Pertinent Vitals/ Pain       Pain Assessment: No/denies pain  Home Living  Prior Functioning/Environment              Frequency  Min 2X/week        Progress Toward Goals  OT Goals(current goals can now be found in the care plan section)  Progress towards OT goals: Progressing toward goals  Acute Rehab OT Goals Patient Stated Goal: return home OT Goal Formulation: With patient Time For Goal Achievement: 05/17/17 Potential to Achieve Goals:  Good ADL Goals Pt Will Perform Grooming: with modified independence;standing Pt Will Perform Upper Body Dressing: with modified independence;standing Pt Will Perform Lower Body Dressing: with modified independence;sit to/from stand Pt Will Transfer to Toilet: with modified independence;ambulating (handicapped height toilet) Pt Will Perform Toileting - Clothing Manipulation and hygiene: with modified independence;sit to/from stand Pt Will Perform Tub/Shower Transfer: Tub transfer;tub bench;ambulating;rolling walker Pt/caregiver will Perform Home Exercise Program: Increased strength;Both right and left upper extremity;With written HEP provided;Independently Additional ADL Goal #1: Pt will independently incorporate 3 strategies to conserve energy during daily ADL routine.  Plan Discharge plan remains appropriate    Co-evaluation                 AM-PAC PT "6 Clicks" Daily Activity     Outcome Measure   Help from another person eating meals?: A Little Help from another person taking care of personal grooming?: A Little Help from another person toileting, which includes using toliet, bedpan, or urinal?: A Little Help from another person bathing (including washing, rinsing, drying)?: A Little Help from another person to put on and taking off regular upper body clothing?: A Little Help from another person to put on and taking off regular lower body clothing?: A Little 6 Click Score: 18    End of Session Equipment Utilized During Treatment: Gait belt;Rolling walker;Oxygen (3L O2 via [place])  OT Visit Diagnosis: Unsteadiness on feet (R26.81);Muscle weakness (generalized) (M62.81)   Activity Tolerance Patient tolerated treatment well   Patient Left in chair;with call bell/phone within reach;with family/visitor present   Nurse Communication Mobility status        Time: 1033-1100 OT Time Calculation (min): 27 min  Charges: OT General Charges $OT Visit: 1 Procedure OT  Treatments $Self Care/Home Management : 23-37 mins  Patricia Herrlich, MS OTR/L  Pager: Patricia Wagner 05/05/2017, 12:53 PM

## 2017-05-05 NOTE — Progress Notes (Signed)
Patient ID: Patricia [place], female   DOB: 12/30/1950, 66 y.o.   MRN: [id number]     Primary Cardiologist: Dr. [name]  Subjective:    Admitted 04/29/17 with acute respiratory distress in the setting of atrial fibrillation and volume overload. Hypotensive, started on dobutamine, Amio gtt and IV diuresis. Weight down 28 lbs total.   Transitioned to po Amio 05/03/17 CVP 7-8  Creatinine continues to trend down. Now to 1.49. Coox 74.4% this am. BPs somewhat improved.   Feeling ok this morning. At times having mild chest heaviness on her R side, especially with lying down.  Denies lightheadedness or dizziness. No tachy-palpitations.   Weight shows up 3 lbs, though negative 1700 cc.  Only 115 cc of intake recorded (had diet ordered yesterday, so no meals recorded as intake).  Suspect inaccurate.   Objective:   Weight Range: 262 lb (118.8 kg) Body mass index is 43.6 kg/m.   Vital Signs:   Temp:  [97.6 F (36.4 C)-99 F (37.2 C)] 98.5 F (36.9 C) (06/12 0330) Pulse Rate:  [74-93] 93 (06/12 0500) Resp:  [16-23] 20 (06/12 0500) BP: (85-125)/(54-80) 125/71 (06/12 0500) SpO2:  [97 %-100 %] 98 % (06/12 0500) Weight:  [262 lb (118.8 kg)] 262 lb (118.8 kg) (06/12 0330) Last BM Date: 05/04/17  Weight change: Filed Weights   05/03/17 0400 05/04/17 0330 05/05/17 0330  Weight: 263 lb 6.4 oz (119.5 kg) 259 lb 7.7 oz (117.7 kg) 262 lb (118.8 kg)    Intake/Output:   Intake/Output Summary (Last 24 hours) at 05/05/17 0759 Last data filed at 05/05/17 0600  Gross per 24 hour  Intake            115.8 ml  Output             1825 ml  Net          -1709.2 ml     Physical Exam: General: Well appearing. No resp difficulty. HEENT: normal Neck: supple. JVP ~8. Carotids 2+ bilat; no bruits. No thyromegaly or nodule noted. Cor: PMI nondisplaced. Irregularly irregular, No M/G/R noted.  Lungs: CTAB, normal effort. Abdomen: soft, non-tender, distended, no HSM. No bruits or masses. +BS  Extremities: no  cyanosis, clubbing, or rash. 1+ edema to knees. Much improved.  Neuro: alert & orientedx3, cranial nerves grossly intact. moves all 4 extremities w/o difficulty. Affect pleasant   Telemetry: Afib 90s at times rate up to 120-130s Personally reviewed    Labs: CBC  Recent Labs  05/04/17 0330 05/05/17 0450  WBC 6.9 7.4  HGB 13.2 13.0  HCT 44.4 44.2  MCV 92.5 91.5  PLT 110* 419*   Basic Metabolic Panel  Recent Labs  05/03/17 0509 05/04/17 0330 05/05/17 0450  NA 143 142 141  K 3.3* 3.4* 3.5  CL 94* 92* 90*  CO2 38* 42* 41*  GLUCOSE 88 108* 99  BUN 30* 23* 22*  CREATININE 1.73* 1.62* 1.49*  CALCIUM 8.0* 8.0* 8.3*  MG 1.4*  --   --   PHOS 3.0 2.8 2.3*   Liver Function Tests  Recent Labs  05/04/17 0330 05/05/17 0450  ALBUMIN 2.8* 2.7*   No results for input(s): LIPASE, AMYLASE in the last 72 hours. Cardiac Enzymes No results for input(s): CKTOTAL, CKMB, CKMBINDEX, TROPONINI in the last 72 hours.  BNP: BNP (last 3 results)  Recent Labs  10/31/16 1246 11/20/16 1121 04/29/17 0935  BNP 1,218.8* 1,555.6* 2,712.1*    ProBNP (last 3 results) No results for input(s): PROBNP in the  last 8760 hours.   D-Dimer No results for input(s): DDIMER in the last 72 hours. Hemoglobin A1C No results for input(s): HGBA1C in the last 72 hours. Fasting Lipid Panel No results for input(s): CHOL, HDL, LDLCALC, TRIG, CHOLHDL, LDLDIRECT in the last 72 hours. Thyroid Function Tests No results for input(s): TSH, T4TOTAL, T3FREE, THYROIDAB in the last 72 hours.  Invalid input(s): FREET3  Other results:  Imaging/Studies: Transesophageal Echocardiography 03/25/17 Study Conclusions  - Left ventricle: Systolic function was severely reduced. The   estimated ejection fraction was 15%. Diffuse hypokinesis. - Aortic valve: No evidence of vegetation. - Mitral valve: No evidence of vegetation. There was mild to   moderate regurgitation. - Left atrium: The atrium was moderately  dilated. No evidence of   thrombus in the atrial cavity or appendage. - Right ventricle: The cavity size was mildly dilated. Systolic   function was severely reduced. - Right atrium: The atrium was mildly dilated. - Atrial septum: No defect or patent foramen ovale was identified. - Tricuspid valve: No evidence of vegetation. There was moderate   regurgitation. - Pulmonic valve: No evidence of vegetation. - Pericardium, extracardiac: A trivial pericardial effusion was   identified.  Impressions:  - Severe LV dysfunction; mild to moderate MR; moderate LAE; no LAA   thrombus; mild RAE and RVE; severely reduced RV function;   moderate TR.   Medications:     Scheduled Medications: . amiodarone  400 mg Oral BID  . Chlorhexidine Gluconate Cloth  6 each Topical Q0600  . furosemide  80 mg Intravenous BID  . isosorbide-hydrALAZINE  0.5 tablet Oral TID  . potassium chloride  40 mEq Oral BID  . rosuvastatin  10 mg Oral Daily  . sodium chloride flush  10-40 mL Intracatheter Q12H  . sodium chloride flush  3 mL Intravenous Q12H  . Warfarin - Pharmacist Dosing Inpatient   Does not apply q1800    Infusions: . sodium chloride    . DOBUTamine 2.5 mcg/kg/min (05/04/17 0738)    PRN Medications: sodium chloride, acetaminophen, ondansetron (ZOFRAN) IV, sodium chloride flush, sodium chloride flush   Assessment/Plan   1. Acute on chronic biventricular CHF: TEE 03/25/17 with LVEF 15% and severely reduced RV function.  - CVP 7-8. Give dose of IV lasix this am, and then hold IV diuretics. Was on lasix 60 mg BID PTA. Will need 80 mg BID for home vs switch to torsemide.  - Stable on 2.5 mcg of dobutamine. Coox 74.4%.   Discussed with Carolynn Sayers with [facility]. If needs home inotrope, will need to transition to milrinone with national shortage of dobutamine.  - No coreg with dobutamine.  - Continue Bidil 1/2 tab tid.  2. Persistent Afib: Failed multiple DC-CV. Last attempt for DCCV 03/25/17 but had  stopped her amiodarone. INR therapeutic.  - Transitioned to po Amio 05/03/17  - Continue Amio 400mg  BID.  - Plan for DCCV tomorrow.   3. Acute hypercapnic respiratory failure: Suspect OSA at baseline.  Continue CPAP with sleeping.  - Needs sleep study. + Snoring and body habitus pre-disposes for OSA/OHS. - No change.  4. 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  Creatinine improving with inotrope and diuresis.  - Creatinine 1.49 today. Continues to improve.  5. COPD: Suspected.  This may play a role in her hypoxemia.  - No change.   6. PT recommend SNF.   - No change.   Shirley Friar, PA-C 05/05/2017   Advanced Heart Failure Team Pager [phone] (M-F; 7a -  4p)  Please contact [facility] for night-coverage after hours (4p -7a ) and weekends on [email or web address]  Patient seen and examined with the above-signed Advanced Practice Provider and/or Housestaff. I personally reviewed laboratory data, imaging studies and relevant notes. I independently examined the patient and formulated the important aspects of the plan. I have edited the note to reflect any of my changes or salient points. I have personally discussed the plan with the patient and/or family.  Volume status improving but still elevated. Continue IV lasix at least one more day. Renal function improving.   Remains in AF with periods of RVR on tele up to 130. Will continue amio and warfarin (INR 2.25). Plan DC-CV in am. Coumadin dosing discussed with PharmD.   Will likely need home inotropes. Have discussed with [place] this am. Due to national shortage dobutamine not avaialble for home use. Would have to switch to milrinone. Will see how she does post DC-CV and make decision on switch.  Glori Bickers, MD  9:01 AM

## 2017-05-05 NOTE — Progress Notes (Signed)
Patient scheduled for DCCV on 05/06/17 at 2:00. Please keep NPO after midnight tonight.   Arbutus Leas, NP-C Advanced Heart Failure Team  Pager [phone] M-F 7am-4pm.  Please contact [facility] for night-coverage after hours (4p -7a ) and weekends on [email or web address]

## 2017-05-05 NOTE — Progress Notes (Signed)
CARDIAC REHAB PHASE I   PRE:  Rate/Rhythm: 88 a flutter  BP:  Sitting: 86/49        SaO2: 100 3L  MODE:  Ambulation: 150 ft   POST:  Rate/Rhythm: 165 a fib  BP:  Sitting: 113/76         SaO2: 92 3L  Pt incontinent of stool, assisted pt to change gown/pad. Pt somewhat reluctant to walk, ultimately agreeable. Pt ambulated 150 ft on 3L O2, rolling walker, IV, assist x1, mildly unsteady gait, tolerated fair. Pt HR elevated 160s during walk, denies any complaints, brief standing rest x1. Pt to recliner after walk, call bell within reach. Will follow.   [id number] Lenna Sciara, RN, BSN 05/05/2017 2:57 PM

## 2017-05-05 NOTE — Care Management Note (Signed)
Case Management Note  Patient Details  Name: Patricia [place] MRN: [id number] Date of Birth: August 04, 1951  Subjective/Objective:   Pt admitted for SOB, hypotensive, AKI  Action/Plan:   PTA from home - pt is on BIPAP >24 hours.  Pt was independent from home per daughter.  Possible discharge with inotropes - starting dobutamine.  [place] notified of possible home IV infusion referral.  PT eval will be ordered when pt is medically stable.   Expected Discharge Date:  05/04/17               Expected Discharge Plan:  [place]  In-House Referral:     Discharge planning Services     Post Acute Care Choice:    Choice offered to:     DME Arranged:    DME Agency:     HH Arranged:    HH Agency:     Status of Service:     If discussed at [employer], dates discussed:    Additional Comments: 05/05/2017  Pt will have Cardioversion tomorrow - attending with then attempt to wean her off the dobutamine - if this is not successful pt will need to discharge with inotrope - attending aware that current recommendation is SNF.  CSW aware that pt may discharge to SNF with inotropic drug.  05/01/17 SNF recommended - CSW consulted  CM contacted [facility] and verified [facility] is in [place] verified both HH and IV infusion orders can be submitted via [facility] directly.  CM will continue to follow for discharge needs.    Pt started on inotrope and IV amio yesterday.  CM contacted [facility] and informed of tentative referral for IV infusion and RN.  Per [facility] - agency will seek insurance auth for both IV infusion and HHRN.   As a back up plan ; CM requested HHRN order from PA - once order is received CM will verify process with [employer].  Sleep study order form placed on shadow chart - PA aware [name], RN 05/05/2017, 10:40 AM

## 2017-05-06 ENCOUNTER — Encounter (HOSPITAL_COMMUNITY)
Admission: EM | Disposition: A | Discharge status: Home Health | Payer: Self-pay | Source: Home / Self Care | Attending: Interventional Cardiology

## 2017-05-06 ENCOUNTER — Inpatient Hospital Stay (HOSPITAL_COMMUNITY): Payer: Managed Care, Other (non HMO) | Admitting: Certified Registered Nurse Anesthetist

## 2017-05-06 ENCOUNTER — Encounter (HOSPITAL_COMMUNITY): Payer: Managed Care, Other (non HMO) | Admitting: Internal Medicine

## 2017-05-06 ENCOUNTER — Encounter (HOSPITAL_COMMUNITY): Payer: Self-pay

## 2017-05-06 HISTORY — PX: CARDIOVERSION: SHX1299

## 2017-05-06 LAB — RENAL FUNCTION PANEL
Albumin: 2.8 g/dL — ABNORMAL LOW (ref 3.5–5.0)
Anion gap: 11 (ref 5–15)
BUN: 22 mg/dL — ABNORMAL HIGH (ref 6–20)
CO2: 38 mmol/L — ABNORMAL HIGH (ref 22–32)
Calcium: 8 mg/dL — ABNORMAL LOW (ref 8.9–10.3)
Chloride: 92 mmol/L — ABNORMAL LOW (ref 101–111)
Creatinine, Ser: 1.49 mg/dL — ABNORMAL HIGH (ref 0.44–1.00)
GFR calc Af Amer: 42 mL/min — ABNORMAL LOW (ref >60)
GFR calc non Af Amer: 36 mL/min — ABNORMAL LOW (ref >60)
Glucose, Bld: 88 mg/dL (ref 65–99)
Phosphorus: 2.2 mg/dL — ABNORMAL LOW (ref 2.5–4.6)
Potassium: 3.5 mmol/L (ref 3.5–5.1)
Sodium: 141 mmol/L (ref 135–145)

## 2017-05-06 LAB — CBC
HCT: 42.6 % (ref 36.0–46.0)
Hemoglobin: 12.4 g/dL (ref 12.0–15.0)
MCH: 26.7 pg (ref 26.0–34.0)
MCHC: 29.1 g/dL — ABNORMAL LOW (ref 30.0–36.0)
MCV: 91.8 fL (ref 78.0–100.0)
Platelets: 95 10*3/uL — ABNORMAL LOW (ref 150–400)
RBC: 4.64 MIL/uL (ref 3.87–5.11)
RDW: 16.3 % — ABNORMAL HIGH (ref 11.5–15.5)
WBC: 7.6 10*3/uL (ref 4.0–10.5)

## 2017-05-06 LAB — COOXEMETRY PANEL
Carboxyhemoglobin: 2.7 % — ABNORMAL HIGH (ref 0.5–1.5)
Methemoglobin: 0.9 % (ref 0.0–1.5)
O2 Saturation: 71.1 %
Total hemoglobin: 12.6 g/dL (ref 12.0–16.0)

## 2017-05-06 LAB — PROTIME-INR
INR: 2.61
Prothrombin Time: 28.4 seconds — ABNORMAL HIGH (ref 11.4–15.2)

## 2017-05-06 SURGERY — CARDIOVERSION
Anesthesia: General

## 2017-05-06 MED ORDER — SODIUM CHLORIDE 0.9 % IV SOLN
INTRAVENOUS | Status: DC | PRN
  Administered 2017-05-06: 13:00:00 via INTRAVENOUS

## 2017-05-06 MED ORDER — WARFARIN SODIUM 2 MG PO TABS
3.0000 mg | ORAL_TABLET | Freq: Once | ORAL | Status: AC
  Administered 2017-05-06: 3 mg via ORAL
  Filled 2017-05-06: qty 1

## 2017-05-06 MED ORDER — LIDOCAINE 2% (20 MG/ML) 5 ML SYRINGE
INTRAMUSCULAR | Status: DC | PRN
  Administered 2017-05-06: 60 mg via INTRAVENOUS

## 2017-05-06 MED ORDER — ETOMIDATE 2 MG/ML IV SOLN
INTRAVENOUS | Status: DC | PRN
  Administered 2017-05-06: 12 mg via INTRAVENOUS

## 2017-05-06 NOTE — Progress Notes (Signed)
Physical Therapy Treatment Patient Details Name: Patricia Wagner MRN: [id number] DOB: 1951-04-03 Today's Date: 05/06/2017    History of Present Illness 65 admitted with respiratory failure and heart failure. Plan for cardioversion today, 6/13. PMHx: CHF, AFib, cKD, chronic cor pulmonale, CAD, ICM, obestity, CVA    PT Comments    Patient progressing well towards PT goals. Improved ambulation distance but limited due to tachycardia with HR ranging from 90-175 bpm. Pt planned for cardioversion later today. Sp02 difficult to get reading during mobility but appeared to be mid 80s on 2L/min 02. Pt declines any rehab and is adamant about returning home so discharge recommendation updated to HHPT and initial 24/7 S. Might benefit from an aide. Will continue to follow.  Follow Up Recommendations  Supervision for mobility/OOB;Home health PT;Supervision/Assistance - 24 hour     Equipment Recommendations  Rolling walker with 5" wheels    Recommendations for Other Services       Precautions / Restrictions Precautions Precautions: Fall Precaution Comments: watch O2 sats Restrictions Weight Bearing Restrictions: No    Mobility  Bed Mobility               General bed mobility comments: OOB in chair on arrival.  Transfers Overall transfer level: Needs assistance Equipment used: None Transfers: Sit to/from Stand Sit to Stand: Min guard         General transfer comment: Min guard for safety.   Ambulation/Gait Ambulation/Gait assistance: Min guard Ambulation Distance (Feet): 150 Feet Assistive device: Rolling walker (2 wheeled) Gait Pattern/deviations: Step-through pattern;Decreased stride length;Antalgic;Trunk flexed Gait velocity: decreased Gait velocity interpretation: Below normal speed for age/gender General Gait Details: Antalgic like gait due to left knee difficulties. Ambulated without DME and furniture walking noted due to instability; ambulated with RW and balance much  improved. Not able to reac 02 throughout but likely in high 80s on 1-2 L/min 02. HR up to 175 bpm during mobility in Afib. Multiple standing rest breaks.   Stairs            Wheelchair Mobility    Modified Rankin (Stroke Patients Only)       Balance Overall balance assessment: Needs assistance Sitting-balance support: Feet supported;No upper extremity supported Sitting balance-Leahy Scale: Good     Standing balance support: During functional activity Standing balance-Leahy Scale: Fair Standing balance comment: Able to statically stand without UE support for ADL tasks. Requires UE support for functional mobility.                             Cognition Arousal/Alertness: Awake/alert Behavior During Therapy: WFL for tasks assessed/performed Overall Cognitive Status: Within Functional Limits for tasks assessed                                 General Comments: Pt very particular about lines/setup- seems to be baseline.       Exercises      General Comments General comments (skin integrity, edema, etc.): HR ranging from 90s-175 bpm during mobility. Pt asymptomatic. RN aware.       Pertinent Vitals/Pain Pain Assessment: No/denies pain    Home Living                      Prior Function            PT Goals (current goals can now be found in the care  plan section) Progress towards PT goals: Progressing toward goals    Frequency    Min 3X/week      PT Plan Discharge plan needs to be updated    Co-evaluation              AM-PAC PT "6 Clicks" Daily Activity  Outcome Measure  Difficulty turning over in bed (including adjusting bedclothes, sheets and blankets)?: None Difficulty moving from lying on back to sitting on the side of the bed? : None Difficulty sitting down on and standing up from a chair with arms (e.g., wheelchair, bedside commode, etc,.)?: None Help needed moving to and from a bed to chair (including a  wheelchair)?: A Little Help needed walking in hospital room?: A Little Help needed climbing 3-5 steps with a railing? : A Lot 6 Click Score: 20    End of Session Equipment Utilized During Treatment: Gait belt;Oxygen Activity Tolerance: Patient tolerated treatment well;Treatment limited secondary to medical complications (Comment) (HR) Patient left: in chair;with call bell/phone within reach Nurse Communication: Mobility status PT Visit Diagnosis: Unsteadiness on feet (R26.81);Other abnormalities of gait and mobility (R26.89);Difficulty in walking, not elsewhere classified (R26.2);Muscle weakness (generalized) (M62.81)     Time: 5498-2641 PT Time Calculation (min) (ACUTE ONLY): 33 min  Charges:  $Gait Training: 8-22 mins $Therapeutic Exercise: 8-22 mins                    G Codes:       Patricia [place], PT, DPT [phone]     Patricia Wagner 05/06/2017, 11:57 AM

## 2017-05-06 NOTE — Interval H&P Note (Signed)
History and Physical Interval Note:  05/06/2017 1:01 PM  Patricia Wagner  has presented today for surgery, with the diagnosis of AFIB  The various methods of treatment have been discussed with the patient and family. After consideration of risks, benefits and other options for treatment, the patient has consented to  Procedure(s): CARDIOVERSION (N/A) as a surgical intervention .  The patient's history has been reviewed, patient examined, no change in status, stable for surgery.  I have reviewed the patient's chart and labs.  Questions were answered to the patient's satisfaction.     Lenzi Marmo, Quillian Quince

## 2017-05-06 NOTE — H&P (View-Only) (Signed)
Patient ID: Patricia [place], female   DOB: February 08, 1951, 66 y.o.   MRN: [id number]     Primary Cardiologist: Dr. [name]  Subjective:    Admitted 04/29/17 with acute respiratory distress in the setting of atrial fibrillation and volume overload. Hypotensive, started on dobutamine, Amio gtt and IV diuresis. Weight down 28 lbs total.   Transitioned to po Amio 05/03/17 CVP 7-8  Creatinine continues to trend down. Now to 1.49. Coox 74.4% this am. BPs somewhat improved.   Feeling ok this morning. At times having mild chest heaviness on her R side, especially with lying down.  Denies lightheadedness or dizziness. No tachy-palpitations.   Weight shows up 3 lbs, though negative 1700 cc.  Only 115 cc of intake recorded (had diet ordered yesterday, so no meals recorded as intake).  Suspect inaccurate.   Objective:   Weight Range: 262 lb (118.8 kg) Body mass index is 43.6 kg/m.   Vital Signs:   Temp:  [97.6 F (36.4 C)-99 F (37.2 C)] 98.5 F (36.9 C) (06/12 0330) Pulse Rate:  [74-93] 93 (06/12 0500) Resp:  [16-23] 20 (06/12 0500) BP: (85-125)/(54-80) 125/71 (06/12 0500) SpO2:  [97 %-100 %] 98 % (06/12 0500) Weight:  [262 lb (118.8 kg)] 262 lb (118.8 kg) (06/12 0330) Last BM Date: 05/04/17  Weight change: Filed Weights   05/03/17 0400 05/04/17 0330 05/05/17 0330  Weight: 263 lb 6.4 oz (119.5 kg) 259 lb 7.7 oz (117.7 kg) 262 lb (118.8 kg)    Intake/Output:   Intake/Output Summary (Last 24 hours) at 05/05/17 0759 Last data filed at 05/05/17 0600  Gross per 24 hour  Intake            115.8 ml  Output             1825 ml  Net          -1709.2 ml     Physical Exam: General: Well appearing. No resp difficulty. HEENT: normal Neck: supple. JVP ~8. Carotids 2+ bilat; no bruits. No thyromegaly or nodule noted. Cor: PMI nondisplaced. Irregularly irregular, No M/G/R noted.  Lungs: CTAB, normal effort. Abdomen: soft, non-tender, distended, no HSM. No bruits or masses. +BS  Extremities: no  cyanosis, clubbing, or rash. 1+ edema to knees. Much improved.  Neuro: alert & orientedx3, cranial nerves grossly intact. moves all 4 extremities w/o difficulty. Affect pleasant   Telemetry: Afib 90s at times rate up to 120-130s Personally reviewed    Labs: CBC  Recent Labs  05/04/17 0330 05/05/17 0450  WBC 6.9 7.4  HGB 13.2 13.0  HCT 44.4 44.2  MCV 92.5 91.5  PLT 110* 476*   Basic Metabolic Panel  Recent Labs  05/03/17 0509 05/04/17 0330 05/05/17 0450  NA 143 142 141  K 3.3* 3.4* 3.5  CL 94* 92* 90*  CO2 38* 42* 41*  GLUCOSE 88 108* 99  BUN 30* 23* 22*  CREATININE 1.73* 1.62* 1.49*  CALCIUM 8.0* 8.0* 8.3*  MG 1.4*  --   --   PHOS 3.0 2.8 2.3*   Liver Function Tests  Recent Labs  05/04/17 0330 05/05/17 0450  ALBUMIN 2.8* 2.7*   No results for input(s): LIPASE, AMYLASE in the last 72 hours. Cardiac Enzymes No results for input(s): CKTOTAL, CKMB, CKMBINDEX, TROPONINI in the last 72 hours.  BNP: BNP (last 3 results)  Recent Labs  10/31/16 1246 11/20/16 1121 04/29/17 0935  BNP 1,218.8* 1,555.6* 2,712.1*    ProBNP (last 3 results) No results for input(s): PROBNP in the  last 8760 hours.   D-Dimer No results for input(s): DDIMER in the last 72 hours. Hemoglobin A1C No results for input(s): HGBA1C in the last 72 hours. Fasting Lipid Panel No results for input(s): CHOL, HDL, LDLCALC, TRIG, CHOLHDL, LDLDIRECT in the last 72 hours. Thyroid Function Tests No results for input(s): TSH, T4TOTAL, T3FREE, THYROIDAB in the last 72 hours.  Invalid input(s): FREET3  Other results:  Imaging/Studies: Transesophageal Echocardiography 03/25/17 Study Conclusions  - Left ventricle: Systolic function was severely reduced. The   estimated ejection fraction was 15%. Diffuse hypokinesis. - Aortic valve: No evidence of vegetation. - Mitral valve: No evidence of vegetation. There was mild to   moderate regurgitation. - Left atrium: The atrium was moderately  dilated. No evidence of   thrombus in the atrial cavity or appendage. - Right ventricle: The cavity size was mildly dilated. Systolic   function was severely reduced. - Right atrium: The atrium was mildly dilated. - Atrial septum: No defect or patent foramen ovale was identified. - Tricuspid valve: No evidence of vegetation. There was moderate   regurgitation. - Pulmonic valve: No evidence of vegetation. - Pericardium, extracardiac: A trivial pericardial effusion was   identified.  Impressions:  - Severe LV dysfunction; mild to moderate MR; moderate LAE; no LAA   thrombus; mild RAE and RVE; severely reduced RV function;   moderate TR.   Medications:     Scheduled Medications: . amiodarone  400 mg Oral BID  . Chlorhexidine Gluconate Cloth  6 each Topical Q0600  . furosemide  80 mg Intravenous BID  . isosorbide-hydrALAZINE  0.5 tablet Oral TID  . potassium chloride  40 mEq Oral BID  . rosuvastatin  10 mg Oral Daily  . sodium chloride flush  10-40 mL Intracatheter Q12H  . sodium chloride flush  3 mL Intravenous Q12H  . Warfarin - Pharmacist Dosing Inpatient   Does not apply q1800    Infusions: . sodium chloride    . DOBUTamine 2.5 mcg/kg/min (05/04/17 0738)    PRN Medications: sodium chloride, acetaminophen, ondansetron (ZOFRAN) IV, sodium chloride flush, sodium chloride flush   Assessment/Plan   1. Acute on chronic biventricular CHF: TEE 03/25/17 with LVEF 15% and severely reduced RV function.  - CVP 7-8. Give dose of IV lasix this am, and then hold IV diuretics. Was on lasix 60 mg BID PTA. Will need 80 mg BID for home vs switch to torsemide.  - Stable on 2.5 mcg of dobutamine. Coox 74.4%.   Discussed with Carolynn Sayers with [facility]. If needs home inotrope, will need to transition to milrinone with national shortage of dobutamine.  - No coreg with dobutamine.  - Continue Bidil 1/2 tab tid.  2. Persistent Afib: Failed multiple DC-CV. Last attempt for DCCV 03/25/17 but had  stopped her amiodarone. INR therapeutic.  - Transitioned to po Amio 05/03/17  - Continue Amio 400mg  BID.  - Plan for DCCV tomorrow.   3. Acute hypercapnic respiratory failure: Suspect OSA at baseline.  Continue CPAP with sleeping.  - Needs sleep study. + Snoring and body habitus pre-disposes for OSA/OHS. - No change.  4. 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  Creatinine improving with inotrope and diuresis.  - Creatinine 1.49 today. Continues to improve.  5. COPD: Suspected.  This may play a role in her hypoxemia.  - No change.   6. PT recommend SNF.   - No change.   Shirley Friar, PA-C 05/05/2017   Advanced Heart Failure Team Pager [phone] (M-F; 7a -  4p)  Please contact [facility] for night-coverage after hours (4p -7a ) and weekends on [email or web address]  Patient seen and examined with the above-signed Advanced Practice Provider and/or Housestaff. I personally reviewed laboratory data, imaging studies and relevant notes. I independently examined the patient and formulated the important aspects of the plan. I have edited the note to reflect any of my changes or salient points. I have personally discussed the plan with the patient and/or family.  Volume status improving but still elevated. Continue IV lasix at least one more day. Renal function improving.   Remains in AF with periods of RVR on tele up to 130. Will continue amio and warfarin (INR 2.25). Plan DC-CV in am. Coumadin dosing discussed with PharmD.   Will likely need home inotropes. Have discussed with [place] this am. Due to national shortage dobutamine not avaialble for home use. Would have to switch to milrinone. Will see how she does post DC-CV and make decision on switch.  Glori Bickers, MD  9:01 AM

## 2017-05-06 NOTE — Anesthesia Preprocedure Evaluation (Addendum)
Anesthesia Evaluation  Patient identified by MRN, date of birth, ID band Patient awake    Reviewed: Allergy & Precautions, H&P , NPO status , Patient's Chart, lab work & pertinent test results  Airway Mallampati: II  TM Distance: >3 FB Neck ROM: Full    Dental  (+) Teeth Intact, Dental Advisory Given   Pulmonary shortness of breath and with exertion, Current Smoker,    breath sounds clear to auscultation       Cardiovascular hypertension, + CAD, + Peripheral Vascular Disease and +CHF  + dysrhythmias Atrial Fibrillation  Rhythm:irregular Rate:Normal     Neuro/Psych CVA, Residual Symptoms    GI/Hepatic   Endo/Other  Morbid obesity  Renal/GU Renal InsufficiencyRenal disease     Musculoskeletal  (+) Arthritis ,   Abdominal   Peds  Hematology   Anesthesia Other Findings   Reproductive/Obstetrics                            Anesthesia Physical Anesthesia Plan  ASA: III  Anesthesia Plan: General   Post-op Pain Management:    Induction: Intravenous  PONV Risk Score and Plan: 2 and Propofol and Treatment may vary due to age or medical condition  Airway Management Planned: Mask  Additional Equipment:   Intra-op Plan:   Post-operative Plan:   Informed Consent: I have reviewed the patients History and Physical, chart, labs and discussed the procedure including the risks, benefits and alternatives for the proposed anesthesia with the patient or authorized representative who has indicated his/her understanding and acceptance.     Plan Discussed with: CRNA, Anesthesiologist and Surgeon  Anesthesia Plan Comments:         Anesthesia Quick Evaluation

## 2017-05-06 NOTE — Transfer of Care (Signed)
Immediate Anesthesia Transfer of Care Note  Patient: Patricia Wagner  Procedure(s) Performed: Procedure(s): CARDIOVERSION (N/A)  Patient Location: Endoscopy Unit  Anesthesia Type:General  Level of Consciousness: awake and alert   Airway & Oxygen Therapy: Patient Spontanous Breathing and Patient connected to nasal cannula oxygen  Post-op Assessment: Report given to RN, Post -op Vital signs reviewed and stable and Patient moving all extremities X 4  Post vital signs: Reviewed and stable  Last Vitals:  Vitals:   05/06/17 1259 05/06/17 1327  BP: (!) 140/98 123/76  Pulse: 95 95  Resp: 14 20  Temp: 36.8 C     Last Pain:  Vitals:   05/06/17 1259  TempSrc: Oral  PainSc:       Patients Stated Pain Goal: 0 (67/54/49 2010)  Complications: No apparent anesthesia complications

## 2017-05-06 NOTE — Progress Notes (Signed)
ANTICOAGULATION CONSULT NOTE - Follow Up Consult  Pharmacy Consult for Coumadin Indication: atrial fibrillation  Allergies  Allergen Reactions  . Ace Inhibitors Palpitations   Patient Measurements: Height: 5\' 5"  (165.1 cm) Weight: 256 lb (116.1 kg) IBW/kg (Calculated) : 57 Heparin Dosing Weight: n/a  Vital Signs: Temp: 98.2 F (36.8 C) (06/13 1259) Temp Source: Oral (06/13 1259) BP: 140/98 (06/13 1259) Pulse Rate: 95 (06/13 1259)  Labs:  Recent Labs  05/04/17 0330 05/05/17 0450 05/06/17 0503  HGB 13.2 13.0 12.4  HCT 44.4 44.2 42.6  PLT 110* 107* 95*  LABPROT 27.3* 25.2* 28.4*  INR 2.48 2.25 2.61  CREATININE 1.62* 1.49* 1.49*   Estimated Creatinine Clearance: 49.2 mL/min (A) (by C-G formula based on SCr of 1.49 mg/dL (H)).  Assessment: 66 yo female on chronic Coumadin for afib.  Admitted with supratherapeutic INR, doses held x 2 days then resumed on 6/8. INR trended down yesterday and higher dose given to prevent INR from falling < 2 with plan for DCCV today. INR therapeutic at 2.61 today. CBC stable. No bleeding. Continues on po amiodarone 400mg  bid (pta dose 200mg  daily).  PTA Coumadin dose 3 mg daily, although dose had been reduced at last several outpatient visits. May need less than this at discharge.  Goal of Therapy:  INR 2-3 Monitor platelets by anticoagulation protocol: Yes   Plan:  1) Coumadin 3mg  tonight 2) Daily INR  Nena Jordan, PharmD, BCPS  05/06/2017 1:31 PM

## 2017-05-06 NOTE — Anesthesia Procedure Notes (Signed)
Date/Time: 05/06/2017 1:28 PM Performed by: Garrison Columbus T Pre-anesthesia Checklist: Patient identified, Emergency Drugs available, Suction available and Patient being monitored Patient Re-evaluated:Patient Re-evaluated prior to inductionOxygen Delivery Method: Ambu bag Preoxygenation: Pre-oxygenation with 100% oxygen Intubation Type: IV induction Placement Confirmation: positive ETCO2 and breath sounds checked- equal and bilateral Dental Injury: Teeth and Oropharynx as per pre-operative assessment

## 2017-05-06 NOTE — Progress Notes (Signed)
Patient ID: Patricia Wagner, female   DOB: 02-Jun-1951, 66 y.o.   MRN: [id number]     Primary Cardiologist: Dr. [name]  Subjective:    Admitted 04/29/17 with acute respiratory distress in the setting of atrial fibrillation and volume overload. Hypotensive, started on dobutamine, Amio gtt and IV diuresis. Weight down 34 lbs total.   Transitioned to po Amio 05/03/17.  Scheduled for DCCV this afternoon.  I/O + 97.8 cc. No UO recorded during evening shift. Creatinine stable at 1.49. Coox 71%   Feeling OK this am.  States after this, she does not want any more DCCV. Denies SOB. No lightheadedness or dizziness. No CP.   Weight shows down 6 lbs. CVP 5-6 this am. No diuretics ordered.   Objective:   Weight Range: 256 lb 3.2 oz (116.2 kg) Body mass index is 42.63 kg/m.   Vital Signs:   Temp:  [98.5 F (36.9 C)-98.9 F (37.2 C)] 98.5 F (36.9 C) (06/13 0000) Pulse Rate:  [60-87] 83 (06/13 0512) Resp:  [18-21] 19 (06/13 0512) BP: (100-114)/(63-75) 108/65 (06/13 0512) SpO2:  [90 %-100 %] 98 % (06/13 0512) Weight:  [256 lb 3.2 oz (116.2 kg)] 256 lb 3.2 oz (116.2 kg) (06/13 0500) Last BM Date: 05/04/17  Weight change: Filed Weights   05/04/17 0330 05/05/17 0330 05/06/17 0500  Weight: 259 lb 7.7 oz (117.7 kg) 262 lb (118.8 kg) 256 lb 3.2 oz (116.2 kg)    Intake/Output:   Intake/Output Summary (Last 24 hours) at 05/06/17 0827 Last data filed at 05/06/17 0400  Gross per 24 hour  Intake              718 ml  Output              300 ml  Net              418 ml     Physical Exam: General: Well appearing. No resp difficulty. HEENT: normal Neck: supple. JVP ~6-7 Carotids 2+ bilat; no bruits. No thyromegaly or nodule noted. Cor: PMI nondisplaced. Irregularly irregular.  Lungs: CTAB, normal effort. Abdomen: soft, non-tender, distended, no HSM. No bruits or masses. +BS  Extremities: no cyanosis, clubbing, or rash. Trace to 1+ edema 2/3 way to knees.   Neuro: alert & orientedx3, cranial  nerves grossly intact. moves all 4 extremities w/o difficulty. Affect pleasant   Telemetry: Personally reviewed, Afib 80-90s rates up into 120s at times.   Labs: CBC  Recent Labs  05/05/17 0450 05/06/17 0503  WBC 7.4 7.6  HGB 13.0 12.4  HCT 44.2 42.6  MCV 91.5 91.8  PLT 107* 95*   Basic Metabolic Panel  Recent Labs  05/05/17 0450 05/06/17 0503  NA 141 141  K 3.5 3.5  CL 90* 92*  CO2 41* 38*  GLUCOSE 99 88  BUN 22* 22*  CREATININE 1.49* 1.49*  CALCIUM 8.3* 8.0*  PHOS 2.3* 2.2*   Liver Function Tests  Recent Labs  05/05/17 0450 05/06/17 0503  ALBUMIN 2.7* 2.8*   No results for input(s): LIPASE, AMYLASE in the last 72 hours. Cardiac Enzymes No results for input(s): CKTOTAL, CKMB, CKMBINDEX, TROPONINI in the last 72 hours.  BNP: BNP (last 3 results)  Recent Labs  10/31/16 1246 11/20/16 1121 04/29/17 0935  BNP 1,218.8* 1,555.6* 2,712.1*    ProBNP (last 3 results) No results for input(s): PROBNP in the last 8760 hours.   D-Dimer No results for input(s): DDIMER in the last 72 hours. Hemoglobin A1C No results for input(s):  HGBA1C in the last 72 hours. Fasting Lipid Panel No results for input(s): CHOL, HDL, LDLCALC, TRIG, CHOLHDL, LDLDIRECT in the last 72 hours. Thyroid Function Tests No results for input(s): TSH, T4TOTAL, T3FREE, THYROIDAB in the last 72 hours.  Invalid input(s): FREET3  Other results:  Imaging/Studies: Transesophageal Echocardiography 03/25/17 Study Conclusions  - Left ventricle: Systolic function was severely reduced. The   estimated ejection fraction was 15%. Diffuse hypokinesis. - Aortic valve: No evidence of vegetation. - Mitral valve: No evidence of vegetation. There was mild to   moderate regurgitation. - Left atrium: The atrium was moderately dilated. No evidence of   thrombus in the atrial cavity or appendage. - Right ventricle: The cavity size was mildly dilated. Systolic   function was severely reduced. - Right  atrium: The atrium was mildly dilated. - Atrial septum: No defect or patent foramen ovale was identified. - Tricuspid valve: No evidence of vegetation. There was moderate   regurgitation. - Pulmonic valve: No evidence of vegetation. - Pericardium, extracardiac: A trivial pericardial effusion was   identified.  Impressions:  - Severe LV dysfunction; mild to moderate MR; moderate LAE; no LAA   thrombus; mild RAE and RVE; severely reduced RV function;   moderate TR.   Medications:     Scheduled Medications: . amiodarone  400 mg Oral BID  . Chlorhexidine Gluconate Cloth  6 each Topical Q0600  . isosorbide-hydrALAZINE  0.5 tablet Oral TID  . potassium chloride  40 mEq Oral BID  . rosuvastatin  10 mg Oral Daily  . sodium chloride flush  10-40 mL Intracatheter Q12H  . sodium chloride flush  3 mL Intravenous Q12H  . Warfarin - Pharmacist Dosing Inpatient   Does not apply q1800    Infusions: . sodium chloride    . DOBUTamine 2.5 mcg/kg/min (05/06/17 0506)    PRN Medications: sodium chloride, acetaminophen, ondansetron (ZOFRAN) IV, sodium chloride flush, sodium chloride flush   Assessment/Plan   1. Acute on chronic biventricular CHF: TEE 03/25/17 with LVEF 15% and severely reduced RV function.  - CVP 5-6. Likely resume po diuretics this evening. Pt was on lasix 60 mg BID at home. Will likely need lasix 80 mg BID at least, if not switch to torsemide. Will discuss with MD. - Coox 71% on 2.5 mcg of dobutamine. Will wean to 1 mcg/kg/min with DCCV this pm.  Discussed with Carolynn Sayers with [facility]. If needs home inotrope, will need to transition to milrinone with national shortage of dobutamine. Will attempt wean s/p DCCV.  - No coreg with dobutamine.  - Continue Bidil 1/2 tab tid.  2. Persistent Afib: Failed multiple DC-CV. Last attempt for DCCV 03/25/17 but had stopped her amiodarone. INR therapeutic.  - Transitioned to po Amio 05/03/17  - Continue Amio 400mg  BID for now.  - Plan for  DCCV this afternoon.   3. Acute hypercapnic respiratory failure: Suspect OSA at baseline.  Continue CPAP with sleeping.  - Needs sleep study. + Snoring and body habitus pre-disposes for OSA/OHS. - No change.  4. 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  Creatinine improving with inotrope and diuresis.  - Creatinine stable at 1.49.  5. COPD: Suspected.  This may play a role in her hypoxemia.  - No change.   6. PT recommend SNF.   - No change.   Shirley Friar, PA-C 05/06/2017   Advanced Heart Failure Team Pager [phone] (M-F; 7a - 4p)  Please contact [facility] for night-coverage after hours (4p -7a ) and  weekends on [email or web address]  Patient seen and examined with the above-signed Advanced Practice Provider and/or Housestaff. I personally reviewed laboratory data, imaging studies and relevant notes. I independently examined the patient and formulated the important aspects of the plan. I have edited the note to reflect any of my changes or salient points. I have personally discussed the plan with the patient and/or family.  Volume status finally at baseline. Will stop lasix. Co-ox ok. Wean dobutamine to 1 mcg/kg/min. Creatinine stable.  Remains in AF with mildly elevated rates. Continue amio and warfarin. For DC-CV today. Warfarin dosing discussed with PharmD.   Continue to mobilize.  May need home inotropes.     Glori Bickers, MD  1:03 PM

## 2017-05-06 NOTE — CV Procedure (Signed)
      DIRECT CURRENT CARDIOVERSION  NAME:  Patricia Wagner   MRN: [id number] DOB:  [birth year]   ADMIT DATE: 04/29/2017   INDICATIONS: Atrial fibrillation    PROCEDURE:   Informed consent was obtained prior to the procedure. The risks, benefits and alternatives for the procedure were discussed and the patient comprehended these risks. Once an appropriate time out was taken, the patient had the defibrillator pads placed in the anterior and posterior position. The patient then underwent sedation by the anesthesia service. Once an appropriate level of sedation was achieved, the patient received a single biphasic, synchronized 200J shock with prompt conversion to sinus rhythm. No apparent complications.  Glori Bickers, MD  1:38 PM

## 2017-05-07 ENCOUNTER — Encounter (HOSPITAL_COMMUNITY): Payer: Self-pay | Admitting: Internal Medicine

## 2017-05-07 LAB — CBC
HCT: 41.4 % (ref 36.0–46.0)
Hemoglobin: 12.1 g/dL (ref 12.0–15.0)
MCH: 27 pg (ref 26.0–34.0)
MCHC: 29.2 g/dL — AB (ref 30.0–36.0)
MCV: 92.4 fL (ref 78.0–100.0)
PLATELETS: 109 10*3/uL — AB (ref 150–400)
RBC: 4.48 MIL/uL (ref 3.87–5.11)
RDW: 16.4 % — ABNORMAL HIGH (ref 11.5–15.5)
WBC: 6.7 10*3/uL (ref 4.0–10.5)

## 2017-05-07 LAB — COOXEMETRY PANEL
CARBOXYHEMOGLOBIN: 2.4 % — AB (ref 0.5–1.5)
METHEMOGLOBIN: 1.1 % (ref 0.0–1.5)
O2 SAT: 77.8 %
Total hemoglobin: 12.4 g/dL (ref 12.0–16.0)

## 2017-05-07 LAB — RENAL FUNCTION PANEL
Albumin: 2.9 g/dL — ABNORMAL LOW (ref 3.5–5.0)
Anion gap: 8 (ref 5–15)
BUN: 23 mg/dL — AB (ref 6–20)
CHLORIDE: 94 mmol/L — AB (ref 101–111)
CO2: 38 mmol/L — AB (ref 22–32)
CREATININE: 1.44 mg/dL — AB (ref 0.44–1.00)
Calcium: 8.6 mg/dL — ABNORMAL LOW (ref 8.9–10.3)
GFR calc Af Amer: 44 mL/min — ABNORMAL LOW (ref >60)
GFR calc non Af Amer: 38 mL/min — ABNORMAL LOW (ref >60)
Glucose, Bld: 89 mg/dL (ref 65–99)
POTASSIUM: 4.3 mmol/L (ref 3.5–5.1)
Phosphorus: 2.4 mg/dL — ABNORMAL LOW (ref 2.5–4.6)
Sodium: 140 mmol/L (ref 135–145)

## 2017-05-07 LAB — PROTIME-INR
INR: 2.82
PROTHROMBIN TIME: 30.2 s — AB (ref 11.4–15.2)

## 2017-05-07 MED ORDER — FUROSEMIDE 40 MG PO TABS
60.0000 mg | ORAL_TABLET | Freq: Two times a day (BID) | ORAL | Status: DC
  Administered 2017-05-07 (×2): 60 mg via ORAL
  Filled 2017-05-07 (×4): qty 1

## 2017-05-07 MED ORDER — RANOLAZINE ER 500 MG PO TB12
500.0000 mg | ORAL_TABLET | Freq: Two times a day (BID) | ORAL | Status: DC
  Administered 2017-05-07 – 2017-05-16 (×18): 500 mg via ORAL
  Filled 2017-05-07 (×18): qty 1

## 2017-05-07 MED ORDER — WARFARIN SODIUM 2 MG PO TABS
2.0000 mg | ORAL_TABLET | Freq: Once | ORAL | Status: AC
  Administered 2017-05-07: 2 mg via ORAL
  Filled 2017-05-07: qty 1

## 2017-05-07 NOTE — Progress Notes (Signed)
Patient ID: Patricia Wagner, female   DOB: 03-01-1951, 66 y.o.   MRN: [id number]     Primary Cardiologist: Dr. [name]  Subjective:    Admitted 04/29/17 with acute respiratory distress in the setting of atrial fibrillation and volume overload. Hypotensive, started on dobutamine, Amio gtt and IV diuresis. Weight down 34 lbs total.   Transitioned to po Amio 05/03/17.  S/p DCCV with NSR 05/06/17  I/O + 350 cc. Weight shows up ~ 2 lbs. Again, barely any UO recorded. Coox 77.8% this am on Dobutamine 1 mcg/kg/min.   Feeling good this morning. Feels better in NSR. Denies SOB. No CP. Denies lightheadedness or dizziness.   Objective:   Weight Range: 257 lb 14.4 oz (117 kg) Body mass index is 42.92 kg/m.   Vital Signs:   Temp:  [98 F (36.7 C)-98.4 F (36.9 C)] 98.3 F (36.8 C) (06/14 0332) Pulse Rate:  [59-116] 66 (06/14 0708) Resp:  [12-30] 16 (06/14 0708) BP: (106-140)/(62-98) 131/68 (06/14 0332) SpO2:  [88 %-100 %] 100 % (06/14 0708) Weight:  [256 lb (116.1 kg)-257 lb 14.4 oz (117 kg)] 257 lb 14.4 oz (117 kg) (06/14 0332) Last BM Date: 05/06/17  Weight change: Filed Weights   05/06/17 0500 05/06/17 1259 05/07/17 0332  Weight: 256 lb 3.2 oz (116.2 kg) 256 lb (116.1 kg) 257 lb 14.4 oz (117 kg)    Intake/Output:   Intake/Output Summary (Last 24 hours) at 05/07/17 0733 Last data filed at 05/07/17 0600  Gross per 24 hour  Intake           526.79 ml  Output              176 ml  Net           350.79 ml     Physical Exam: General: Well appearing. No resp difficulty. HEENT: normal Neck: supple. JVP ~6-7. Carotids 2+ bilat; no bruits. No thyromegaly or nodule noted. Cor: PMI nondisplaced. RRR, No M/G/R noted Lungs: CTAB, normal effort. Abdomen: soft, non-tender, distended, no HSM. No bruits or masses. +BS  Extremities: no cyanosis, clubbing, or rash.  Trace ankle edema.  Neuro: alert & orientedx3, cranial nerves grossly intact. moves all 4 extremities w/o difficulty. Affect  pleasant   Telemetry: Personally reviewed, NSR 60-70s   Labs: CBC  Recent Labs  05/06/17 0503 05/07/17 0349  WBC 7.6 6.7  HGB 12.4 12.1  HCT 42.6 41.4  MCV 91.8 92.4  PLT 95* 160*   Basic Metabolic Panel  Recent Labs  05/06/17 0503 05/07/17 0349  NA 141 140  K 3.5 4.3  CL 92* 94*  CO2 38* 38*  GLUCOSE 88 89  BUN 22* 23*  CREATININE 1.49* 1.44*  CALCIUM 8.0* 8.6*  PHOS 2.2* 2.4*   Liver Function Tests  Recent Labs  05/06/17 0503 05/07/17 0349  ALBUMIN 2.8* 2.9*   No results for input(s): LIPASE, AMYLASE in the last 72 hours. Cardiac Enzymes No results for input(s): CKTOTAL, CKMB, CKMBINDEX, TROPONINI in the last 72 hours.  BNP: BNP (last 3 results)  Recent Labs  10/31/16 1246 11/20/16 1121 04/29/17 0935  BNP 1,218.8* 1,555.6* 2,712.1*    ProBNP (last 3 results) No results for input(s): PROBNP in the last 8760 hours.   D-Dimer No results for input(s): DDIMER in the last 72 hours. Hemoglobin A1C No results for input(s): HGBA1C in the last 72 hours. Fasting Lipid Panel No results for input(s): CHOL, HDL, LDLCALC, TRIG, CHOLHDL, LDLDIRECT in the last 72 hours. Thyroid Function Tests  No results for input(s): TSH, T4TOTAL, T3FREE, THYROIDAB in the last 72 hours.  Invalid input(s): FREET3  Other results:  Imaging/Studies: Transesophageal Echocardiography 03/25/17 Study Conclusions  - Left ventricle: Systolic function was severely reduced. The   estimated ejection fraction was 15%. Diffuse hypokinesis. - Aortic valve: No evidence of vegetation. - Mitral valve: No evidence of vegetation. There was mild to   moderate regurgitation. - Left atrium: The atrium was moderately dilated. No evidence of   thrombus in the atrial cavity or appendage. - Right ventricle: The cavity size was mildly dilated. Systolic   function was severely reduced. - Right atrium: The atrium was mildly dilated. - Atrial septum: No defect or patent foramen ovale was  identified. - Tricuspid valve: No evidence of vegetation. There was moderate   regurgitation. - Pulmonic valve: No evidence of vegetation. - Pericardium, extracardiac: A trivial pericardial effusion was   identified.  Impressions:  - Severe LV dysfunction; mild to moderate MR; moderate LAE; no LAA   thrombus; mild RAE and RVE; severely reduced RV function;   moderate TR.   Medications:     Scheduled Medications: . amiodarone  400 mg Oral BID  . Chlorhexidine Gluconate Cloth  6 each Topical Q0600  . isosorbide-hydrALAZINE  0.5 tablet Oral TID  . potassium chloride  40 mEq Oral BID  . rosuvastatin  10 mg Oral Daily  . sodium chloride flush  10-40 mL Intracatheter Q12H  . sodium chloride flush  3 mL Intravenous Q12H  . Warfarin - Pharmacist Dosing Inpatient   Does not apply q1800    Infusions: . sodium chloride    . DOBUTamine 1 mcg/kg/min (05/07/17 0351)    PRN Medications: sodium chloride, acetaminophen, ondansetron (ZOFRAN) IV, sodium chloride flush, sodium chloride flush   Assessment/Plan   1. Acute on chronic biventricular CHF: TEE 03/25/17 with LVEF 15% and severely reduced RV function.  - CVP 6-7. Will resume home lasix dose of 60 mg BID now that she is in NSR.  - Coox 77.8% this am on dobutamine 1 mcg/kg/min. Will stop for now.  If coox drops, will need to switch to milrinone for home due to national dobutamine shortage.  - No coreg with low output earlier this admission. Suspect related to Afib.  - Continue Bidil 1/2 tab tid.  2. Persistent Afib: Failed multiple DC-CV. Last attempt for DCCV 03/25/17 but had stopped her amiodarone. INR therapeutic.  - She is now in Afib s/p DCCV 05/06/17 - Transitioned to po Amio 05/03/17  - Continue Amio 400mg  BID for now. Will discuss amount of load she has gotten so far with Pharm D.  3. Acute hypercapnic respiratory failure: Suspect OSA at baseline.  Continue CPAP with sleeping.  - Needs sleep study. + Snoring and body habitus  pre-disposes for OSA/OHS. - No change.  4. 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  Creatinine improving with inotrope and diuresis.  - Creatinine stable at 1.44.   5. COPD: Suspected.  This may play a role in her hypoxemia.  - No change.  6. PT recommend SNF.   - No change.    If tolerates dobutamine wean, possible home tomorrow. Resume po lasix.   Shirley Friar, PA-C 05/07/2017   Advanced Heart Failure Team Pager [phone] (M-F; 7a - 4p)  Please contact [facility] for night-coverage after hours (4p -7a ) and weekends on [email or web address]  Patient seen and examined with the above-signed Advanced Practice Provider and/or Housestaff. I personally reviewed laboratory data, imaging studies  and relevant notes. I independently examined the patient and formulated the important aspects of the plan. I have edited the note to reflect any of my changes or salient points. I have personally discussed the plan with the patient and/or family.  She is maintaining NSR after DC-CV yesterday. Now on po amio. INR is therapeutic.   Volume status looks good. Co-ox ok. Renal function stable.  Will stop dobutamine and follow co-ox. Resume home lasix.   Possible d/c in am with HHRN. She is anxious to go back to work PT.   Glori Bickers, MD  6:55 PM

## 2017-05-07 NOTE — Progress Notes (Signed)
ANTICOAGULATION CONSULT NOTE - Follow Up Consult  Pharmacy Consult for Coumadin Indication: atrial fibrillation  Allergies  Allergen Reactions  . Ace Inhibitors Palpitations   Patient Measurements: Height: 5\' 5"  (165.1 cm) Weight: 257 lb 14.4 oz (117 kg) IBW/kg (Calculated) : 57 Heparin Dosing Weight: n/a  Vital Signs: Temp: 98.3 F (36.8 C) (06/14 0708) Temp Source: Oral (06/14 0708) BP: 112/96 (06/14 0708) Pulse Rate: 66 (06/14 0708)  Labs:  Recent Labs  05/05/17 0450 05/06/17 0503 05/07/17 0349  HGB 13.0 12.4 12.1  HCT 44.2 42.6 41.4  PLT 107* 95* 109*  LABPROT 25.2* 28.4* 30.2*  INR 2.25 2.61 2.82  CREATININE 1.49* 1.49* 1.44*   Estimated Creatinine Clearance: 51.1 mL/min (A) (by C-G formula based on SCr of 1.44 mg/dL (H)).  Assessment: 66 yo female on chronic Coumadin for afib.  Admitted with supratherapeutic INR, doses held x 2 days then resumed on 6/8.  INR continues to trend up this am to 2.8, no bleeding issues noted. Successfully cardioverted yesterday, will reduce dose slightly tonight.  Continues on po amiodarone 400mg  bid (pta dose 200mg  daily).  PTA Coumadin dose 3 mg daily, although dose had been reduced at last several outpatient visits. May need less than this at discharge.  Goal of Therapy:  INR 2-3 Monitor platelets by anticoagulation protocol: Yes   Plan:  1) Coumadin 2mg  tonight 2) Daily INR  Erin Hearing PharmD., BCPS Clinical Pharmacist Pager [phone] 05/07/2017 9:02 AM

## 2017-05-07 NOTE — Anesthesia Postprocedure Evaluation (Signed)
Anesthesia Post Note  Patient: Patricia Wagner  Procedure(s) Performed: Procedure(s) (LRB): CARDIOVERSION (N/A)     Patient location during evaluation: PACU Anesthesia Type: General Level of consciousness: awake and alert Pain management: pain level controlled Vital Signs Assessment: post-procedure vital signs reviewed and stable Respiratory status: spontaneous breathing, nonlabored ventilation, respiratory function stable and patient connected to nasal cannula oxygen Cardiovascular status: blood pressure returned to baseline and stable Postop Assessment: no signs of nausea or vomiting Anesthetic complications: no    Last Vitals:  Vitals:   05/07/17 0332 05/07/17 0708  BP: 131/68 (!) 112/96  Pulse: (!) 59 66  Resp: 18 16  Temp: 36.8 C 36.8 C    Last Pain:  Vitals:   05/07/17 0708  TempSrc: Oral  PainSc:                  Kimble S

## 2017-05-08 LAB — PROTIME-INR
INR: 2.54
Prothrombin Time: 27.9 seconds — ABNORMAL HIGH (ref 11.4–15.2)

## 2017-05-08 LAB — RENAL FUNCTION PANEL
ANION GAP: 10 (ref 5–15)
Albumin: 2.7 g/dL — ABNORMAL LOW (ref 3.5–5.0)
BUN: 23 mg/dL — AB (ref 6–20)
CO2: 37 mmol/L — AB (ref 22–32)
Calcium: 8.6 mg/dL — ABNORMAL LOW (ref 8.9–10.3)
Chloride: 95 mmol/L — ABNORMAL LOW (ref 101–111)
Creatinine, Ser: 1.48 mg/dL — ABNORMAL HIGH (ref 0.44–1.00)
GFR calc Af Amer: 42 mL/min — ABNORMAL LOW (ref >60)
GFR calc non Af Amer: 37 mL/min — ABNORMAL LOW (ref >60)
GLUCOSE: 91 mg/dL (ref 65–99)
POTASSIUM: 4.7 mmol/L (ref 3.5–5.1)
Phosphorus: 2.6 mg/dL (ref 2.5–4.6)
SODIUM: 142 mmol/L (ref 135–145)

## 2017-05-08 LAB — COOXEMETRY PANEL
CARBOXYHEMOGLOBIN: 2.1 % — AB (ref 0.5–1.5)
Carboxyhemoglobin: 2.3 % — ABNORMAL HIGH (ref 0.5–1.5)
METHEMOGLOBIN: 0.9 % (ref 0.0–1.5)
METHEMOGLOBIN: 1.1 % (ref 0.0–1.5)
O2 SAT: 45.5 %
O2 Saturation: 64.4 %
TOTAL HEMOGLOBIN: 12.5 g/dL (ref 12.0–16.0)
Total hemoglobin: 11.4 g/dL — ABNORMAL LOW (ref 12.0–16.0)

## 2017-05-08 LAB — CBC
HEMATOCRIT: 42.1 % (ref 36.0–46.0)
Hemoglobin: 12.2 g/dL (ref 12.0–15.0)
MCH: 26.8 pg (ref 26.0–34.0)
MCHC: 29 g/dL — AB (ref 30.0–36.0)
MCV: 92.5 fL (ref 78.0–100.0)
PLATELETS: 118 10*3/uL — AB (ref 150–400)
RBC: 4.55 MIL/uL (ref 3.87–5.11)
RDW: 16.2 % — AB (ref 11.5–15.5)
WBC: 6.5 10*3/uL (ref 4.0–10.5)

## 2017-05-08 LAB — MAGNESIUM: Magnesium: 1.8 mg/dL (ref 1.7–2.4)

## 2017-05-08 MED ORDER — AMIODARONE HCL IN DEXTROSE 360-4.14 MG/200ML-% IV SOLN
30.0000 mg/h | INTRAVENOUS | Status: DC
  Administered 2017-05-09 – 2017-05-14 (×10): 30 mg/h via INTRAVENOUS
  Filled 2017-05-08 (×12): qty 200

## 2017-05-08 MED ORDER — FUROSEMIDE 10 MG/ML IJ SOLN
80.0000 mg | Freq: Two times a day (BID) | INTRAMUSCULAR | Status: DC
  Administered 2017-05-08 – 2017-05-13 (×11): 80 mg via INTRAVENOUS
  Filled 2017-05-08 (×11): qty 8

## 2017-05-08 MED ORDER — AMIODARONE LOAD VIA INFUSION
150.0000 mg | Freq: Once | INTRAVENOUS | Status: AC
  Administered 2017-05-08: 150 mg via INTRAVENOUS
  Filled 2017-05-08: qty 83.34

## 2017-05-08 MED ORDER — AMIODARONE HCL IN DEXTROSE 360-4.14 MG/200ML-% IV SOLN
60.0000 mg/h | INTRAVENOUS | Status: AC
  Administered 2017-05-08 (×2): 60 mg/h via INTRAVENOUS
  Filled 2017-05-08 (×2): qty 200

## 2017-05-08 MED ORDER — WARFARIN SODIUM 2 MG PO TABS
3.0000 mg | ORAL_TABLET | Freq: Once | ORAL | Status: AC
  Administered 2017-05-08: 3 mg via ORAL
  Filled 2017-05-08 (×2): qty 1.5

## 2017-05-08 NOTE — Care Management Note (Signed)
Case Management Note  Patient Details  Name: Patricia Wagner MRN: [id number] Date of Birth: 1951-10-29  Subjective/Objective:   Pt admitted for SOB, hypotensive, AKI  Action/Plan:   PTA from home - pt is on BIPAP >24 hours.  Pt was independent from home per daughter.  Possible discharge with inotropes - starting dobutamine.  [place] notified of possible home IV infusion referral.  PT eval will be ordered when pt is medically stable.   Expected Discharge Date:  05/04/17               Expected Discharge Plan:  [place]  In-House Referral:     Discharge planning Services     Post Acute Care Choice:    Choice offered to:     DME Arranged:    DME Agency:     HH Arranged:    HH Agency:     Status of Service:     If discussed at [employer], dates discussed:    Additional Comments: 05/08/2017  Pt is now s/p cardioversion, dobutamine is off.  However, pt is now in AFIB and IV lasix restarted.  Pt is now refusing PT eval and stating she wants to discharge home.  CM spoke with [place] Luisa Hart [phone] ext [phone] - request call back if pt is to discharge home and needs [facility] - so she can expedite [facility] approval (if infusion is not needed).  CM contacted HF team and requested additonal assessment with pt at bedside as pt is now refusing PT, walking and SNF.  HF NP agrees that pt would be safely discharged to SNF - will discuss directly with pt   05/05/17 Pt will have Cardioversion tomorrow - attending with then attempt to wean her off the dobutamine - if this is not successful pt will need to discharge with inotrope - attending aware that current recommendation is SNF.  CSW aware that pt may discharge to SNF with inotropic drug.  05/01/17 SNF recommended - CSW consulted  CM contacted [facility] and verified [facility] is in [place] verified both HH and IV infusion orders can be submitted via [facility] directly.  CM will continue to follow for  discharge needs.    Pt started on inotrope and IV amio yesterday.  CM contacted [facility] and informed of tentative referral for IV infusion and RN.  Per [facility] - agency will seek insurance auth for both IV infusion and HHRN.   As a back up plan ; CM requested HHRN order from PA - once order is received CM will verify process with [employer].  Sleep study order form placed on shadow chart - PA aware [name], RN 05/08/2017, 11:58 AM

## 2017-05-08 NOTE — Consult Note (Signed)
Cardiology Consultation:   Patient ID: Patricia Wagner; [id number]; 02/01/1951   Admit date: 04/29/2017 Date of Consult: 05/08/2017  Primary Care Provider: Biagio Borg, MD Primary Cardiologist: Dr. [name]    Patient Profile:   Patricia Wagner is a 66 y.o. female with a hx of Non obstructiv CAD, NICM, Biventricular CHF (combined), HTN, HLD, morbid obesity, CVA,  who is being seen today for the evaluation of AFib rhythm control strategies at the request of [name].  History of Present Illness:   Patricia Wagner was admitted to [facility] 04/29/17 with increasing fatigue, SOB, AKI, and required BIPAP support, requiring Dobutamine and IV amiodarone for AF rate control.  On 05/06/17 after significant diuresis 20L and 19lbs, underwent DCCV 6/13 restoring SR, unfortunately  Last night went back into AF.    She was seen by EP service in consult in December, at that time noted her w/persistent atrial fibrillation (1st diagnosed in November 2017).  She had undergone cardioversion on 11/03/16 and was back in atrial flutter with RVR at AF clinic visit 11/06/16, but unaware of palpitations or change in symptoms. She then developed progressive shortness of breath and underwent repeat DCCV 11/18/16.  She then presented to the ER on 12/27 with complaints of left eye pain and left sided headache. Imaging demonstrated acute nonhemorrhagic left superior cerebellar infarct.  She was also significantly bradycardic on arrival with sinus rates in the 40's on Sotalol 120mg  twice daily, Metropolol 50mg  twice daily, Digoxin. Her Sotalol, BB, and digoxin were held with return to rate controlled atrial fibrillation.  AT that time was on Eliquis with reported compliance and changed to Coumadin.  Given acute stroke, rate control measures were undertaken with resumption of her BB, with likely hood of needing amiodarone, out patient with cardiology she was infact started on amiodarone (200 mg twice a day for 4 weeks (for rate control), then  decreased to 200 mg daily) in February. >> DCCV 03/25/17 shocked 4x with ERAF each time, maintained on amiodarone.  LABS K+ 4.7 BUN/Creat 23/1.48 Mag 1.8 H/H 12/42 WBC 6.5 Plts 118 INR 2.54   Past Medical History:  Diagnosis Date  . Arthritis   . Atrial fibrillation ([place])    a. s/p multiple cardioversions; failed tikosyn/sotalol.  Marland Kitchen CAD in native artery, s/p cardiac cath with non obstructive CAD 10/24/2016  . CHF (congestive heart failure) ([place])   . DIVERTICULITIS, HX OF 07/25/2007  . DIVERTICULOSIS, COLON 07/22/2007  . Dyspnea   . Edema, peripheral    a. chronic BLE edema, R>L. Prior trauma from dog attack and accident.  Marland Kitchen HYPERLIPIDEMIA 02/03/2008  . Hypersomnia    declines w/u  . Hypertension   . MENOPAUSAL DISORDER 01/09/2011  . Morbid obesity ([place]) 07/22/2007  . NICM (nonischemic cardiomyopathy) ([place]) 10/24/2016  . Raynaud's syndrome 07/22/2007  . Stroke ([place]) 2017  . THYROID NODULE, RIGHT 01/04/2010  . VITAMIN D DEFICIENCY 01/09/2011   Qualifier: Diagnosis of  By: [name], [name]     Past Surgical History:  Procedure Laterality Date  . CARDIAC CATHETERIZATION N/A 10/23/2016   Procedure: Left Heart Cath and Coronary Angiography;  Surgeon: [name], MD;  Location: [place] CV LAB;  Service: Cardiovascular;  Laterality: N/A;  . CARDIOVERSION N/A 10/31/2016   Procedure: CARDIOVERSION;  Surgeon: [name], MD;  Location: [facility] ENDOSCOPY;  Service: Cardiovascular;  Laterality: N/A;  . CARDIOVERSION N/A 11/03/2016   Procedure: CARDIOVERSION;  Surgeon: [name], MD;  Location: [place];  Service: Cardiovascular;  Laterality: N/A;  . CARDIOVERSION N/A 11/18/2016   Procedure: CARDIOVERSION;  Surgeon: [name], MD;  Location: [facility] ENDOSCOPY;  Service: Cardiovascular;  Laterality: N/A;  . CARDIOVERSION N/A 03/25/2017   Procedure: CARDIOVERSION;  Surgeon: [name], MD;  Location: [facility] ENDOSCOPY;  Service: Cardiovascular;  Laterality: N/A;  .  CARDIOVERSION N/A 05/06/2017   Procedure: CARDIOVERSION;  Surgeon: [name], MD;  Location: [facility] ENDOSCOPY;  Service: Cardiovascular;  Laterality: N/A;  . CHOLECYSTECTOMY    . COLONOSCOPY    . PARTIAL HYSTERECTOMY     1 OVARY LEFT  . POLYPECTOMY    . TEE WITHOUT CARDIOVERSION N/A 10/31/2016   Procedure: TRANSESOPHAGEAL ECHOCARDIOGRAM (TEE);  Surgeon: [name], MD;  Location: [facility] ENDOSCOPY;  Service: Cardiovascular;  Laterality: N/A;  . TEE WITHOUT CARDIOVERSION N/A 03/25/2017   Procedure: TRANSESOPHAGEAL ECHOCARDIOGRAM (TEE);  Surgeon: [name], MD;  Location: [facility] ENDOSCOPY;  Service: Cardiovascular;  Laterality: N/A;     Inpatient Medications: Scheduled Meds: . amiodarone  400 mg Oral BID  . Chlorhexidine Gluconate Cloth  6 each Topical Q0600  . furosemide  80 mg Intravenous BID  . isosorbide-hydrALAZINE  0.5 tablet Oral TID  . potassium chloride  40 mEq Oral BID  . ranolazine  500 mg Oral BID  . rosuvastatin  10 mg Oral Daily  . sodium chloride flush  10-40 mL Intracatheter Q12H  . sodium chloride flush  3 mL Intravenous Q12H  . warfarin  3 mg Oral ONCE-1800  . Warfarin - Pharmacist Dosing Inpatient   Does not apply q1800   Continuous Infusions: . sodium chloride     PRN Meds: sodium chloride, acetaminophen, ondansetron (ZOFRAN) IV, sodium chloride flush, sodium chloride flush  Allergies:    Allergies  Allergen Reactions  . Ace Inhibitors Palpitations    Social History:   Social History   Social History  . Marital status: Single    Spouse name: N/A  . Number of children: 1  . Years of education: N/A   Occupational History  . Printing     Social History Main Topics  . Smoking status: Current Every Day Smoker    Packs/day: 0.50    Types: Cigarettes    Last attempt to quit: 10/23/2016  . Smokeless tobacco: Never Used     Comment: Smoked for 50 years  . Alcohol use 2.4 oz/week    4 Cans of beer per week     Comment: Intermittent  . Drug use:  No  . Sexual activity: Not on file   Other Topics Concern  . Not on file   Social History Narrative  . No narrative on file    Family History:   The patient's family history includes Asthma in her mother; Diabetes in her father; Heart disease in her father and sister; Lung disease in her sister. There is no history of Thyroid disease.  ROS:  Please see the history of present illness.  ROS  All other ROS reviewed and negative.     Physical Exam/Data:   Vitals:   05/08/17 0404 05/08/17 0409 05/08/17 0802 05/08/17 1243  BP: (!) 86/69 (!) 86/69 (!) 86/69 97/80  Pulse: 98 100 99 (!) 127  Resp: (!) 21 16 (!) 21 20  Temp:  98.5 F (36.9 C) 98.5 F (36.9 C) 97.5 F (36.4 C)  TempSrc:  Oral Oral Oral  SpO2: 99% 98% 100% 95%  Weight:  260 lb (117.9 kg)    Height:  Intake/Output Summary (Last 24 hours) at 05/08/17 1447 Last data filed at 05/08/17 0802  Gross per 24 hour  Intake              240 ml  Output             1100 ml  Net             -860 ml   Filed Weights   05/06/17 1259 05/07/17 0332 05/08/17 0409  Weight: 256 lb (116.1 kg) 257 lb 14.4 oz (117 kg) 260 lb (117.9 kg)   Body mass index is 43.27 kg/m.  General:  Well nourished, well developed, in no acute distress, is OOB to chair HEENT: normal Lymph: no adenopathy Neck: no JVD Endocrine:  No thryomegaly Vascular: No carotid bruits Cardiac: IRRR; 1/6SM Lungs:  clear to auscultation bilaterally, no wheezing, rhonchi or rales  Abd: soft, nontender  Ext: no edema Musculoskeletal:  No deformities, BUE and BLE strength normal and equal Skin: warm and dry  Neuro:  no gross focal abnormalities noted Psych:  Normal affect   Reviewed by myself: EKG:  Afib 98bpm, IVCD Telemetry is AFib 110-130bpm  Relevant CV Studies:  03/25/17: TEE Study Conclusions - Left ventricle: Systolic function was severely reduced. The   estimated ejection fraction was 15%. Diffuse hypokinesis. - Aortic valve: No evidence of  vegetation. - Mitral valve: No evidence of vegetation. There was mild to   moderate regurgitation. - Left atrium: The atrium was moderately dilated. No evidence of   thrombus in the atrial cavity or appendage. - Right ventricle: The cavity size was mildly dilated. Systolic   function was severely reduced. - Right atrium: The atrium was mildly dilated. - Atrial septum: No defect or patent foramen ovale was identified. - Tricuspid valve: No evidence of vegetation. There was moderate   regurgitation. - Pulmonic valve: No evidence of vegetation. - Pericardium, extracardiac: A trivial pericardial effusion was   identified. Impressions: - Severe LV dysfunction; mild to moderate MR; moderate LAE; no LAA   thrombus; mild RAE and RVE; severely reduced RV function;   moderate TR.  11/21/16: TEE Study Conclusions - Left ventricle: The cavity size was mildly to moderately dilated.   Wall thickness was increased in a pattern of mild LVH. Systolic   function was severely reduced. The estimated ejection fraction   was in the range of 20% to 25%. Diffuse hypokinesis.   Indeterminant diastolic function (atrial fibrillation). - Aortic valve: There was no stenosis. - Mitral valve: Mildly calcified annulus. There was trivial   regurgitation. - Left atrium: The atrium was severely dilated. - Right ventricle: The cavity size was normal. Systolic function   was moderately reduced. - Right atrium: The atrium was moderately dilated. - Pulmonary arteries: No complete TR doppler jet so unable to   estimate PA systolic pressure. - Systemic veins: IVC measured 1.9 cm with < 50% respirophasic   variation, suggesting RA pressure 8 mmHg. - Pericardium, extracardiac: A trivial pericardial effusion was   identified posterior to the heart. Impressions: - The patient was in atrial fibrillation. Mild to moderate LV   dilation with mild LV hypertrophy. EF 20-25%, diffuse   hypokinesis. Normal RV size with  moderately decreased systolic   function. Biatrial enlargement.  10/23/16: [facility] Conclusions: 1. Mild, non-obstructive coronary artery disease consistent with non-ischemic cardiomyopathy. 2. Mildly elevated left ventricular filling pressure  Laboratory Data:  Chemistry Recent Labs Lab 05/06/17 0503 05/07/17 0349 05/08/17 0528  NA 141 140 142  K 3.5 4.3 4.7  CL 92* 94* 95*  CO2 38* 38* 37*  GLUCOSE 88 89 91  BUN 22* 23* 23*  CREATININE 1.49* 1.44* 1.48*  CALCIUM 8.0* 8.6* 8.6*  GFRNONAA 36* 38* 37*  GFRAA 42* 44* 42*  ANIONGAP 11 8 10      Recent Labs Lab 05/06/17 0503 05/07/17 0349 05/08/17 0528  ALBUMIN 2.8* 2.9* 2.7*   Hematology Recent Labs Lab 05/06/17 0503 05/07/17 0349 05/08/17 0528  WBC 7.6 6.7 6.5  RBC 4.64 4.48 4.55  HGB 12.4 12.1 12.2  HCT 42.6 41.4 42.1  MCV 91.8 92.4 92.5  MCH 26.7 27.0 26.8  MCHC 29.1* 29.2* 29.0*  RDW 16.3* 16.4* 16.2*  PLT 95* 109* 118*    Radiology/Studies:  No results found.  Assessment and Plan:   1. Persistent AFib     CHA2DS2Vasc is at least 5, on warfarin     Currently back on PO amiodarone (400mg  BID)     LA 79mm on her TTE     Failed multiple DCCV attempts despite amiodarone     No great rhythm control options     Could consider AF ablation when the schedule allows (this would be an out patient plan)     AVNode ablation/ICD may be a consideration as well     Continue rate control strategies      2. Biventricular failure, acute on chronic CHF     Back in IV lasix     C/w CHF team      Signed, Baldwin Jamaica, PA-C  05/08/2017 2:47 PM   I have seen and examined this patient with Tommye Standard.  Agree with above, note added to reflect my findings.  On exam, iRRR, tachycardic, no murmurs, lungs clear.   Was admitted to the hospital with fatigue, shortness of breath, acute kidney injury requiring BiPAP, dobutamine, and a IV amiodarone for atrial fibrillation rate control. She has been diuresed up to 19  pounds. She underwent cardioversion on 6/13 referring sinus rhythm, but did go back into atrial fibrillation. Unfortunately, her atrial fibrillation does contribute to her heart failure. Due to that, would continue to load her with amiodarone throughout the weekend. She may benefit from cardioversion on Tuesday. If she does not stay in sinus rhythm, she would likely benefit from AV nodal ablation and pacemaker placement. If she does remain in sinus rhythm, she would likely benefit from an atrial fibrillation ablation in the future for further rhythm control. This would likely need to be done posthospitalization.  Will M. Camnitz MD 05/08/2017 6:05 PM

## 2017-05-08 NOTE — Progress Notes (Signed)
On my arrival, pt up in room looking for her glasses, getting frustrated. HR 145 afib. When she found her glasses, declined ambulation, sts she is not walking today. Encouraged her to walk with PT later. Discussed with pt HF booklet, daily wts, low sodium. She was somewhat attentive although tangential at times. She was able to state 2000 mg of sodium daily. Will need reiteration. Left materials for her to read as well. Did not give ex gl as pt is currently unable to walk far and in uncontrolled afib. [id number] Yves Dill CES, ACSM 9:35 AM 05/08/2017

## 2017-05-08 NOTE — Progress Notes (Signed)
Physical Therapy Treatment Patient Details Name: Patricia Wagner MRN: [id number] DOB: 16-Jul-1951 Today's Date: 05/08/2017    History of Present Illness 65 admitted with respiratory failure and heart failure. Plan for cardioversion today, 6/13. PMHx: CHF, AFib, cKD, chronic cor pulmonale, CAD, ICM, obestity, CVA    PT Comments    Patient seen for mobility progression. Ambulated patient on room air. Saturations fluctuating to low 70s but poor pleth reading and no clear wave form. Patient denies SOB and remained fully conversational throughout activity. HR elevated upper 140s. Nsg aware. Will continue to see and progress as tolerated.     Follow Up Recommendations  Supervision for mobility/OOB;Home health PT;Supervision/Assistance - 24 hour     Equipment Recommendations  Rolling walker with 5" wheels    Recommendations for Other Services       Precautions / Restrictions Precautions Precautions: Fall Precaution Comments: watch O2 sats Restrictions Weight Bearing Restrictions: No    Mobility  Bed Mobility               General bed mobility comments: OOB in chair on arrival.  Transfers Overall transfer level: Needs assistance Equipment used: None Transfers: Sit to/from Stand Sit to Stand: Min guard         General transfer comment: Min guard for safety and line management  Ambulation/Gait Ambulation/Gait assistance: Min guard Ambulation Distance (Feet): 180 Feet Assistive device: Rolling walker (2 wheeled) Gait Pattern/deviations: Step-through pattern;Decreased stride length;Antalgic;Trunk flexed Gait velocity: decreased Gait velocity interpretation: Below normal speed for age/gender General Gait Details: one standing rest break, increased pain in left knee with ambulation, heavy reliance on RW during mobility. Saturations with poor pleth on room air indicating 70s however, patient fully conversational, no evidence of SOB or DOE during ambulation. HR elevated 140s  with mobility   Stairs            Wheelchair Mobility    Modified Rankin (Stroke Patients Only)       Balance Overall balance assessment: Needs assistance Sitting-balance support: Feet supported;No upper extremity supported Sitting balance-Leahy Scale: Good     Standing balance support: During functional activity Standing balance-Leahy Scale: Fair                              Cognition Arousal/Alertness: Awake/alert Behavior During Therapy: Flat affect Overall Cognitive Status: Within Functional Limits for tasks assessed Area of Impairment: Safety/judgement;Awareness;Problem solving;Attention                   Current Attention Level: Selective       Awareness: Emergent Problem Solving: Slow processing        Exercises      General Comments General comments (skin integrity, edema, etc.): HR elevated 140s with mobility      Pertinent Vitals/Pain Pain Assessment: No/denies pain    Home Living                      Prior Function            PT Goals (current goals can now be found in the care plan section) Acute Rehab PT Goals Patient Stated Goal: return home PT Goal Formulation: With patient Time For Goal Achievement: 05/15/17 Potential to Achieve Goals: Fair Progress towards PT goals: Progressing toward goals    Frequency    Min 3X/week      PT Plan Discharge plan needs to be updated    Co-evaluation  AM-PAC PT "6 Clicks" Daily Activity  Outcome Measure  Difficulty turning over in bed (including adjusting bedclothes, sheets and blankets)?: None Difficulty moving from lying on back to sitting on the side of the bed? : None Difficulty sitting down on and standing up from a chair with arms (e.g., wheelchair, bedside commode, etc,.)?: None Help needed moving to and from a bed to chair (including a wheelchair)?: A Little Help needed walking in hospital room?: A Little Help needed climbing 3-5  steps with a railing? : A Lot 6 Click Score: 20    End of Session Equipment Utilized During Treatment: Gait belt;Oxygen Activity Tolerance: Patient tolerated treatment well;Treatment limited secondary to medical complications (Comment) (HR) Patient left: in chair;with call bell/phone within reach Nurse Communication: Mobility status PT Visit Diagnosis: Unsteadiness on feet (R26.81);Other abnormalities of gait and mobility (R26.89);Difficulty in walking, not elsewhere classified (R26.2);Muscle weakness (generalized) (M62.81)     Time: 4854-6270 PT Time Calculation (min) (ACUTE ONLY): 17 min  Charges:  $Gait Training: 8-22 mins                    G Codes:       Alben Deeds, PT DPT  [phone]    Duncan Dull 05/08/2017, 4:20 PM

## 2017-05-08 NOTE — Progress Notes (Signed)
Patient ID: Patricia Wagner, female   DOB: [birth year], 66 y.o.   MRN: [id number]     Primary Cardiologist: Dr. [name]  Subjective:    Admitted 04/29/17 with acute respiratory distress in the setting of atrial fibrillation and volume overload. Hypotensive, started on dobutamine, Amio gtt and IV diuresis. Weight down 34 lbs total.   Transitioned to po Amio 05/03/17.  S/p DCCV with NSR 05/06/17  Frustrated to be back in Afib. Decompensated this am. Less energy. Coox down, weight and CVP up. CVP 10-11.   I/O inaccurate.  Weight up 3 lbs. Coox back down to 45.5% in afib. Stat recheck ordered.   Objective:   Weight Range: 260 lb (117.9 kg) Body mass index is 43.27 kg/m.   Vital Signs:   Temp:  [98.1 F (36.7 C)-99 F (37.2 C)] 98.5 F (36.9 C) (06/15 0409) Pulse Rate:  [72-130] 100 (06/15 0409) Resp:  [15-25] 16 (06/15 0409) BP: (86-112)/(69-75) 86/69 (06/15 0409) SpO2:  [94 %-99 %] 98 % (06/15 0409) Weight:  [260 lb (117.9 kg)] 260 lb (117.9 kg) (06/15 0409) Last BM Date: 05/07/17  Weight change: Filed Weights   05/06/17 1259 05/07/17 0332 05/08/17 0409  Weight: 256 lb (116.1 kg) 257 lb 14.4 oz (117 kg) 260 lb (117.9 kg)    Intake/Output:   Intake/Output Summary (Last 24 hours) at 05/08/17 0803 Last data filed at 05/08/17 0500  Gross per 24 hour  Intake             4.93 ml  Output             1275 ml  Net         -1270.07 ml     Physical Exam: General: Fatigued. No resp difficulty. HEENT: normal Neck: supple. JVP to jaw. Carotids 2+ bilat; no bruits. No thyromegaly or nodule noted. Cor: PMI nondisplaced. Irregularly irregular, tachy at times No M/G/R noted Lungs: CTAB, normal effort. Abdomen: soft, non-tender, distended, no HSM. No bruits or masses. +BS  Extremities: no cyanosis, clubbing, or rash. Trace to 1+ ankle edema.  Neuro: alert & orientedx3, cranial nerves grossly intact. moves all 4 extremities w/o difficulty. Affect flat but appropriate.   Telemetry:  Personally reviewed, afib 100s. Up to 120-130 at times.   Labs: CBC  Recent Labs  05/07/17 0349 05/08/17 0528  WBC 6.7 6.5  HGB 12.1 12.2  HCT 41.4 42.1  MCV 92.4 92.5  PLT 109* 409*   Basic Metabolic Panel  Recent Labs  05/07/17 0349 05/08/17 0528  NA 140 142  K 4.3 4.7  CL 94* 95*  CO2 38* 37*  GLUCOSE 89 91  BUN 23* 23*  CREATININE 1.44* 1.48*  CALCIUM 8.6* 8.6*  MG  --  1.8  PHOS 2.4* 2.6   Liver Function Tests  Recent Labs  05/07/17 0349 05/08/17 0528  ALBUMIN 2.9* 2.7*   No results for input(s): LIPASE, AMYLASE in the last 72 hours. Cardiac Enzymes No results for input(s): CKTOTAL, CKMB, CKMBINDEX, TROPONINI in the last 72 hours.  BNP: BNP (last 3 results)  Recent Labs  10/31/16 1246 11/20/16 1121 04/29/17 0935  BNP 1,218.8* 1,555.6* 2,712.1*    ProBNP (last 3 results) No results for input(s): PROBNP in the last 8760 hours.   D-Dimer No results for input(s): DDIMER in the last 72 hours. Hemoglobin A1C No results for input(s): HGBA1C in the last 72 hours. Fasting Lipid Panel No results for input(s): CHOL, HDL, LDLCALC, TRIG, CHOLHDL, LDLDIRECT in the last 72 hours.  Thyroid Function Tests No results for input(s): TSH, T4TOTAL, T3FREE, THYROIDAB in the last 72 hours.  Invalid input(s): FREET3  Other results:  Imaging/Studies: Transesophageal Echocardiography 03/25/17 Study Conclusions  - Left ventricle: Systolic function was severely reduced. The   estimated ejection fraction was 15%. Diffuse hypokinesis. - Aortic valve: No evidence of vegetation. - Mitral valve: No evidence of vegetation. There was mild to   moderate regurgitation. - Left atrium: The atrium was moderately dilated. No evidence of   thrombus in the atrial cavity or appendage. - Right ventricle: The cavity size was mildly dilated. Systolic   function was severely reduced. - Right atrium: The atrium was mildly dilated. - Atrial septum: No defect or patent foramen  ovale was identified. - Tricuspid valve: No evidence of vegetation. There was moderate   regurgitation. - Pulmonic valve: No evidence of vegetation. - Pericardium, extracardiac: A trivial pericardial effusion was   identified.  Impressions:  - Severe LV dysfunction; mild to moderate MR; moderate LAE; no LAA   thrombus; mild RAE and RVE; severely reduced RV function;   moderate TR.   Medications:     Scheduled Medications: . amiodarone  400 mg Oral BID  . Chlorhexidine Gluconate Cloth  6 each Topical Q0600  . furosemide  60 mg Oral BID  . isosorbide-hydrALAZINE  0.5 tablet Oral TID  . potassium chloride  40 mEq Oral BID  . ranolazine  500 mg Oral BID  . rosuvastatin  10 mg Oral Daily  . sodium chloride flush  10-40 mL Intracatheter Q12H  . sodium chloride flush  3 mL Intravenous Q12H  . Warfarin - Pharmacist Dosing Inpatient   Does not apply q1800    Infusions: . sodium chloride      PRN Medications: sodium chloride, acetaminophen, ondansetron (ZOFRAN) IV, sodium chloride flush, sodium chloride flush   Assessment/Plan   1. Acute on chronic biventricular CHF: TEE 03/25/17 with LVEF 15% and severely reduced RV function.  - CVP up to 10-11. Will start back on IV lasix at 80 mg BID.  - Coox 45%. Stat repeat pending.  - No coreg with low output earlier this admission. Suspect related to Afib.  - Continue Bidil 1/2 tab tid.  2. Persistent Afib: Failed multiple DC-CV. Last attempt for DCCV 03/25/17 but had stopped her amiodarone. INR therapeutic.  - She is now in Afib s/p DCCV 05/06/17 - Transitioned to po Amio 05/03/17 Remains on amio 400 mg BID.  3. Acute hypercapnic respiratory failure: Suspect OSA at baseline.  Continue CPAP with sleeping.  - Needs sleep study. + Snoring and body habitus pre-disposes for OSA/OHS. - No change. .  4. 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  Creatinine improving with inotrope and diuresis.  - Creatinine relatively  stable at 1.48. Need to diurese more with recurrent Afib.  5. COPD: Suspected.  This may play a role in her hypoxemia.  - No change.  6. PT recommend SNF.   - No change.   Coox low now back in afib and off dobutamine. Stat recheck. Resume IV diuresis.   Shirley Friar, PA-C 05/08/2017   Advanced Heart Failure Team Pager [phone] (M-F; 7a - 4p)  Please contact [facility] for night-coverage after hours (4p -7a ) and weekends on [email or web address]  Patient seen and examined with the above-signed Advanced Practice Provider and/or Housestaff. I personally reviewed laboratory data, imaging studies and relevant notes. I independently examined the patient and formulated the important aspects of the plan. I have edited  the note to reflect any of my changes or salient points. I have personally discussed the plan with the patient and/or family.  She is back in AF with RVR today. Feels worse. CVP back up. Co-ox dropping. Will restart IV amiodarone and IV lasix. Low threshold to restart inotropes to prevent recurrent shock.  Continue warfarin. INR therapeutic. Dosing d/w PharmD.   EP consulted and discussed with Dr. Curt Bears at length. Recommend restarting IV amio and plan repeat DC-CV next week. Will consider possible AF ablation down the road.   Total time spent 35 minutes. Over half that time spent discussing above.   Glori Bickers, MD  11:34 PM

## 2017-05-08 NOTE — Progress Notes (Signed)
ANTICOAGULATION CONSULT NOTE - Follow Up Consult  Pharmacy Consult for Coumadin Indication: atrial fibrillation  Allergies  Allergen Reactions  . Ace Inhibitors Palpitations   Patient Measurements: Height: 5\' 5"  (165.1 cm) Weight: 260 lb (117.9 kg) IBW/kg (Calculated) : 57 Heparin Dosing Weight: n/a  Vital Signs: Temp: 97.5 F (36.4 C) (06/15 1243) Temp Source: Oral (06/15 1243) BP: 97/80 (06/15 1243) Pulse Rate: 127 (06/15 1243)  Labs:  Recent Labs  05/06/17 0503 05/07/17 0349 05/08/17 0528  HGB 12.4 12.1 12.2  HCT 42.6 41.4 42.1  PLT 95* 109* 118*  LABPROT 28.4* 30.2* 27.9*  INR 2.61 2.82 2.54  CREATININE 1.49* 1.44* 1.48*   Estimated Creatinine Clearance: 50 mL/min (A) (by C-G formula based on SCr of 1.48 mg/dL (H)).  Assessment: 66 yo female on chronic Coumadin for afib.  Admitted with supratherapeutic INR, doses held x 2 days then resumed on 6/8.  INR down this am to 2.5, no bleeding issues noted. Successfully cardioverted 6/13.   Continues on po amiodarone 400mg  bid (pta dose 200mg  daily).  PTA Coumadin dose 3 mg daily, although dose had been reduced at last several outpatient visits. May need less than this at discharge.  Goal of Therapy:  INR 2-3 Monitor platelets by anticoagulation protocol: Yes   Plan:  1) Coumadin 3mg  tonight 2) Daily INR  Erin Hearing PharmD., BCPS Clinical Pharmacist Pager [phone] 05/08/2017 1:33 PM

## 2017-05-08 NOTE — Progress Notes (Signed)
  Repeat Coox 64.4%. No change to meds at this time.     Legrand Como [address] [place], [place] 05/08/2017 12:39 PM

## 2017-05-09 DIAGNOSIS — N182 Chronic kidney disease, stage 2 (mild): Secondary | ICD-10-CM

## 2017-05-09 LAB — RENAL FUNCTION PANEL
ANION GAP: 9 (ref 5–15)
Albumin: 2.9 g/dL — ABNORMAL LOW (ref 3.5–5.0)
BUN: 26 mg/dL — ABNORMAL HIGH (ref 6–20)
CALCIUM: 8.9 mg/dL (ref 8.9–10.3)
CO2: 37 mmol/L — AB (ref 22–32)
CREATININE: 1.7 mg/dL — AB (ref 0.44–1.00)
Chloride: 94 mmol/L — ABNORMAL LOW (ref 101–111)
GFR, EST AFRICAN AMERICAN: 36 mL/min — AB (ref >60)
GFR, EST NON AFRICAN AMERICAN: 31 mL/min — AB (ref >60)
Glucose, Bld: 92 mg/dL (ref 65–99)
Phosphorus: 3.1 mg/dL (ref 2.5–4.6)
Potassium: 5 mmol/L (ref 3.5–5.1)
SODIUM: 140 mmol/L (ref 135–145)

## 2017-05-09 LAB — COOXEMETRY PANEL
CARBOXYHEMOGLOBIN: 2.1 % — AB (ref 0.5–1.5)
Carboxyhemoglobin: 1.7 % — ABNORMAL HIGH (ref 0.5–1.5)
METHEMOGLOBIN: 0.8 % (ref 0.0–1.5)
METHEMOGLOBIN: 0.7 % (ref 0.0–1.5)
O2 SAT: 54.3 %
O2 Saturation: 29.7 %
TOTAL HEMOGLOBIN: 13 g/dL (ref 12.0–16.0)
TOTAL HEMOGLOBIN: 12.7 g/dL (ref 12.0–16.0)

## 2017-05-09 LAB — CBC
HEMATOCRIT: 43.3 % (ref 36.0–46.0)
HEMOGLOBIN: 12.7 g/dL (ref 12.0–15.0)
MCH: 27.1 pg (ref 26.0–34.0)
MCHC: 29.3 g/dL — AB (ref 30.0–36.0)
MCV: 92.3 fL (ref 78.0–100.0)
Platelets: 128 10*3/uL — ABNORMAL LOW (ref 150–400)
RBC: 4.69 MIL/uL (ref 3.87–5.11)
RDW: 16.3 % — AB (ref 11.5–15.5)
WBC: 5.6 10*3/uL (ref 4.0–10.5)

## 2017-05-09 LAB — PROTIME-INR
INR: 2.48
PROTHROMBIN TIME: 27.3 s — AB (ref 11.4–15.2)

## 2017-05-09 MED ORDER — WARFARIN SODIUM 2 MG PO TABS
3.0000 mg | ORAL_TABLET | Freq: Every day | ORAL | Status: DC
  Administered 2017-05-09 – 2017-05-10 (×2): 3 mg via ORAL
  Filled 2017-05-09 (×2): qty 1.5

## 2017-05-09 MED ORDER — DOBUTAMINE IN D5W 4-5 MG/ML-% IV SOLN
1.0000 ug/kg/min | INTRAVENOUS | Status: DC
  Administered 2017-05-09 – 2017-05-11 (×2): 2.5 ug/kg/min via INTRAVENOUS
  Filled 2017-05-09 (×2): qty 250

## 2017-05-09 NOTE — Progress Notes (Signed)
CARDIAC REHAB PHASE I   PRE:  Rate/Rhythm: 93 afib  BP:  Supine:   Sitting: 138/68  Standing:    SaO2: 100% 3L  MODE:  Ambulation: 140  ft   POST:  Rate/Rhythem: 137 afib  Pt ambulated on roomair, O2 sat-92%ra  BP:  Supine:   Sitting: 137/70  Standing:    SaO2: 92% RA- 3L reapplied  Pt ambulated in hallway using rolling walker x1 assist.  Quick pace, steady gait. Pt request to return to room after one lap to use restroom.  Pt seated on bedside commode, given bath linens.  Pt able to cleanse herself at sink sitting on bedside commode.  Pt returned to chair, call light in reach.  RN made aware of pt BM.   [place], RN, BSN Cardiac Pulmonary Rehab   [facility]

## 2017-05-09 NOTE — Progress Notes (Signed)
Patient ID: Patricia Wagner, female   DOB: 1951-02-01, 67 y.o.   MRN: [id number]     Primary Cardiologist: Dr. [name]  Subjective:    Admitted 04/29/17 with acute respiratory distress in the setting of atrial fibrillation and volume overload. Hypotensive, started on dobutamine, Amio gtt and IV diuresis. Weight down 34 lbs total.   Transitioned to po Amio 05/03/17.  S/p DCCV with NSR 05/06/17. Went back in AF on 6/15  Feels fatigued. Weight going back up. Co-ox 54%   Seen by EP who recommended restarting IV amio and repeat DC-CV next week  Objective:   Weight Range: 118.1 kg (260 lb 4.8 oz) Body mass index is 43.32 kg/m.   Vital Signs:   Temp:  [97.5 F (36.4 C)-98.5 F (36.9 C)] 98.5 F (36.9 C) (06/16 0810) Pulse Rate:  [77-127] 104 (06/16 0810) Resp:  [16-27] 23 (06/16 0810) BP: (96-122)/(72-88) 122/84 (06/16 0810) SpO2:  [95 %-98 %] 98 % (06/16 0400) Weight:  [118.1 kg (260 lb 4.8 oz)] 118.1 kg (260 lb 4.8 oz) (06/16 0451) Last BM Date: 05/08/17  Weight change: Filed Weights   05/07/17 0332 05/08/17 0409 05/09/17 0451  Weight: 117 kg (257 lb 14.4 oz) 117.9 kg (260 lb) 118.1 kg (260 lb 4.8 oz)    Intake/Output:   Intake/Output Summary (Last 24 hours) at 05/09/17 1125 Last data filed at 05/09/17 0654  Gross per 24 hour  Intake           984.04 ml  Output             1650 ml  Net          -665.96 ml     Physical Exam: General:  Sitting in chair. Fatigued apeparing No resp difficulty HEENT: normal Neck: supple. JVP to jaw . Carotids 2+ bilat; no bruits. No lymphadenopathy or thryomegaly appreciated. Cor: PMI nondisplaced. Irregular rate & rhythm. +s3 Lungs: clear Abdomen: soft, nontender, nondistended. No hepatosplenomegaly. No bruits or masses. Good bowel sounds. Extremities: no cyanosis, clubbing, rash, 1-2+ edema Neuro: alert & orientedx3, cranial nerves grossly intact. moves all 4 extremities w/o difficulty. Affect pleasant   Telemetry: AF 90-120 Personally  reviewed   Labs: CBC  Recent Labs  05/08/17 0528 05/09/17 0500  WBC 6.5 5.6  HGB 12.2 12.7  HCT 42.1 43.3  MCV 92.5 92.3  PLT 118* 616*   Basic Metabolic Panel  Recent Labs  05/08/17 0528 05/09/17 0500  NA 142 140  K 4.7 5.0  CL 95* 94*  CO2 37* 37*  GLUCOSE 91 92  BUN 23* 26*  CREATININE 1.48* 1.70*  CALCIUM 8.6* 8.9  MG 1.8  --   PHOS 2.6 3.1   Liver Function Tests  Recent Labs  05/08/17 0528 05/09/17 0500  ALBUMIN 2.7* 2.9*   No results for input(s): LIPASE, AMYLASE in the last 72 hours. Cardiac Enzymes No results for input(s): CKTOTAL, CKMB, CKMBINDEX, TROPONINI in the last 72 hours.  BNP: BNP (last 3 results)  Recent Labs  10/31/16 1246 11/20/16 1121 04/29/17 0935  BNP 1,218.8* 1,555.6* 2,712.1*    ProBNP (last 3 results) No results for input(s): PROBNP in the last 8760 hours.   D-Dimer No results for input(s): DDIMER in the last 72 hours. Hemoglobin A1C No results for input(s): HGBA1C in the last 72 hours. Fasting Lipid Panel No results for input(s): CHOL, HDL, LDLCALC, TRIG, CHOLHDL, LDLDIRECT in the last 72 hours. Thyroid Function Tests No results for input(s): TSH, T4TOTAL, T3FREE, THYROIDAB in the  last 72 hours.  Invalid input(s): FREET3  Other results:  Imaging/Studies: Transesophageal Echocardiography 03/25/17 Study Conclusions  - Left ventricle: Systolic function was severely reduced. The   estimated ejection fraction was 15%. Diffuse hypokinesis. - Aortic valve: No evidence of vegetation. - Mitral valve: No evidence of vegetation. There was mild to   moderate regurgitation. - Left atrium: The atrium was moderately dilated. No evidence of   thrombus in the atrial cavity or appendage. - Right ventricle: The cavity size was mildly dilated. Systolic   function was severely reduced. - Right atrium: The atrium was mildly dilated. - Atrial septum: No defect or patent foramen ovale was identified. - Tricuspid valve: No  evidence of vegetation. There was moderate   regurgitation. - Pulmonic valve: No evidence of vegetation. - Pericardium, extracardiac: A trivial pericardial effusion was   identified.  Impressions:  - Severe LV dysfunction; mild to moderate MR; moderate LAE; no LAA   thrombus; mild RAE and RVE; severely reduced RV function;   moderate TR.   Medications:     Scheduled Medications: . Chlorhexidine Gluconate Cloth  6 each Topical Q0600  . furosemide  80 mg Intravenous BID  . isosorbide-hydrALAZINE  0.5 tablet Oral TID  . potassium chloride  40 mEq Oral BID  . ranolazine  500 mg Oral BID  . rosuvastatin  10 mg Oral Daily  . sodium chloride flush  10-40 mL Intracatheter Q12H  . sodium chloride flush  3 mL Intravenous Q12H  . Warfarin - Pharmacist Dosing Inpatient   Does not apply q1800    Infusions: . sodium chloride    . amiodarone 30 mg/hr (05/09/17 0455)    PRN Medications: sodium chloride, acetaminophen, ondansetron (ZOFRAN) IV, sodium chloride flush, sodium chloride flush   Assessment/Plan   1. Acute on chronic biventricular CHF: TEE 03/25/17 with LVEF 15% and severely reduced RV function.  - She is back in shock after returning to AF. Will restart dobutamine at 2.5. Continue IV lasix.  - Can absolutely not tolerate AF. Will continue IV amio load and repeat DC-CV onTuesday.  - Continue Bidil 1/2 tab tid.  - No bblocker or ACE/ARB/ARNI with low output and AKI 2. Persistent Afib: Failed multiple DC-CV. Last attempt for DCCV 03/25/17 but had stopped her amiodarone. INR therapeutic.  - She is now in Afib s/p DCCV 05/06/17 - Can absolutely not tolerate AF. Will continue IV amio load and repeat DC-CV onTuesday. INR 2.48. Continue warfarin. Discussed with PharmD 3. Acute hypercapnic respiratory failure: Suspect OSA at baseline.  Continue CPAP with sleeping.  - Needs sleep study. + Snoring and body habitus pre-disposes for OSA/OHS. - No change. .  4. AKI on CKD II-III: Suspect  cardiorenal (hemodynamically-mediated).   - Creatinine back up with recurrent AF and shock. Restarting dobutamine  5. COPD: - Stable 6. PT recommend SNF.   - She wants to go home and go back to work so she doesn't lose her job   Glori Bickers, MD 05/09/2017   Advanced Heart Failure Team Pager [phone] (M-F; 7a - 4p)  Please contact [facility] for night-coverage after hours (4p -7a ) and weekends on [email or web address]

## 2017-05-09 NOTE — Progress Notes (Signed)
ANTICOAGULATION CONSULT NOTE - Follow Up Consult  Pharmacy Consult for Coumadin Indication: atrial fibrillation  Allergies  Allergen Reactions  . Ace Inhibitors Palpitations    Patient Measurements: Height: 5\' 5"  (165.1 cm) Weight: 260 lb 4.8 oz (118.1 kg) IBW/kg (Calculated) : 57   Vital Signs: Temp: 98.2 F (36.8 C) (06/16 1340) Temp Source: Oral (06/16 1340) BP: 122/84 (06/16 1340) Pulse Rate: 104 (06/16 0810)  Labs:  Recent Labs  05/07/17 0349 05/08/17 0528 05/09/17 0500  HGB 12.1 12.2 12.7  HCT 41.4 42.1 43.3  PLT 109* 118* 128*  LABPROT 30.2* 27.9* 27.3*  INR 2.82 2.54 2.48  CREATININE 1.44* 1.48* 1.70*    Estimated Creatinine Clearance: 43.5 mL/min (A) (by C-G formula based on SCr of 1.7 mg/dL (H)).   Assessment: Anticoag: Coumadin PTA for afib, INR 4.81 on admission INR 2.48 today, cbc stable -PTA Coumadin dose 3 mg daily (last taken 6/5)  Goal of Therapy:  INR 2-3 Monitor platelets by anticoagulation protocol: Yes   Plan:  Resume home Coumadin 3mg  daily Daily INR  Geron Mulford S. Alford Highland, PharmD, BCPS Clinical Staff Pharmacist Pager [phone]  Eilene Ghazi Stillinger 05/09/2017,2:26 PM

## 2017-05-10 DIAGNOSIS — R570 Cardiogenic shock: Secondary | ICD-10-CM

## 2017-05-10 DIAGNOSIS — N179 Acute kidney failure, unspecified: Secondary | ICD-10-CM

## 2017-05-10 LAB — RENAL FUNCTION PANEL
ALBUMIN: 1.6 g/dL — AB (ref 3.5–5.0)
ANION GAP: 8 (ref 5–15)
ANION GAP: 9 (ref 5–15)
Albumin: 2.8 g/dL — ABNORMAL LOW (ref 3.5–5.0)
BUN: 60 mg/dL — ABNORMAL HIGH (ref 6–20)
BUN: 23 mg/dL — ABNORMAL HIGH (ref 6–20)
CALCIUM: 8.3 mg/dL — AB (ref 8.9–10.3)
CALCIUM: 8.6 mg/dL — AB (ref 8.9–10.3)
CHLORIDE: 94 mmol/L — AB (ref 101–111)
CO2: 34 mmol/L — ABNORMAL HIGH (ref 22–32)
CO2: 35 mmol/L — ABNORMAL HIGH (ref 22–32)
CREATININE: 1.62 mg/dL — AB (ref 0.44–1.00)
Chloride: 107 mmol/L (ref 101–111)
Creatinine, Ser: 0.8 mg/dL (ref 0.44–1.00)
GFR calc Af Amer: 38 mL/min — ABNORMAL LOW (ref >60)
GFR calc non Af Amer: >60 mL/min (ref >60)
GFR calc non Af Amer: 33 mL/min — ABNORMAL LOW (ref >60)
GLUCOSE: 132 mg/dL — AB (ref 65–99)
Glucose, Bld: 133 mg/dL — ABNORMAL HIGH (ref 65–99)
PHOSPHORUS: 3.2 mg/dL (ref 2.5–4.6)
Phosphorus: 2.8 mg/dL (ref 2.5–4.6)
Potassium: 3.6 mmol/L (ref 3.5–5.1)
Potassium: 4.1 mmol/L (ref 3.5–5.1)
SODIUM: 149 mmol/L — AB (ref 135–145)
Sodium: 138 mmol/L (ref 135–145)

## 2017-05-10 LAB — CBC
HCT: 39.7 % (ref 36.0–46.0)
Hemoglobin: 11.8 g/dL — ABNORMAL LOW (ref 12.0–15.0)
MCH: 27.1 pg (ref 26.0–34.0)
MCHC: 29.7 g/dL — AB (ref 30.0–36.0)
MCV: 91.3 fL (ref 78.0–100.0)
PLATELETS: 139 10*3/uL — AB (ref 150–400)
RBC: 4.35 MIL/uL (ref 3.87–5.11)
RDW: 16.2 % — ABNORMAL HIGH (ref 11.5–15.5)
WBC: 5.6 10*3/uL (ref 4.0–10.5)

## 2017-05-10 LAB — COOXEMETRY PANEL
CARBOXYHEMOGLOBIN: 1.3 % (ref 0.5–1.5)
Methemoglobin: 1 % (ref 0.0–1.5)
O2 SAT: 75.4 %
Total hemoglobin: 12.4 g/dL (ref 12.0–16.0)

## 2017-05-10 NOTE — Progress Notes (Addendum)
Patient ID: Patricia Wagner, female   DOB: 24-Jan-1951, 66 y.o.   MRN: [id number]     Primary Cardiologist: Dr. [name]  Subjective:    Admitted 04/29/17 with acute respiratory distress in the setting of atrial fibrillation and volume overload. Hypotensive, started on dobutamine, Amio gtt and IV diuresis. Weight down 34 lbs total.   Transitioned to po Amio 05/03/17.  S/p DCCV with NSR 05/06/17. Went back in AF on 6/15.  Dobutamine restarted 6/16 for recurrent shock and volume overload. Back on IV lasix. CVP 11-12. Feels better on dobutamine. Beginning to diurese again with inotropic support.   Breathing better. No orthopnea or PND. INR therapeutic.     Objective:   Weight Range: 117.2 kg (258 lb 6.4 oz) Body mass index is 43 kg/m.   Vital Signs:   Temp:  [97.9 F (36.6 C)-98.3 F (36.8 C)] 98.2 F (36.8 C) (06/17 1215) Pulse Rate:  [52-100] 100 (06/17 1215) Resp:  [17-25] 20 (06/17 1215) BP: (104-129)/(73-88) 129/83 (06/17 1215) SpO2:  [92 %-100 %] 92 % (06/17 1215) Weight:  [117.2 kg (258 lb 6.4 oz)] 117.2 kg (258 lb 6.4 oz) (06/17 0534) Last BM Date: 05/10/17  Weight change: Filed Weights   05/08/17 0409 05/09/17 0451 05/10/17 0534  Weight: 117.9 kg (260 lb) 118.1 kg (260 lb 4.8 oz) 117.2 kg (258 lb 6.4 oz)    Intake/Output:   Intake/Output Summary (Last 24 hours) at 05/10/17 1453 Last data filed at 05/10/17 1220  Gross per 24 hour  Intake          1075.62 ml  Output             3100 ml  Net         -2024.38 ml     Physical Exam: General:  Sitting in bed. NAD HEENT: normal anicteric  Neck: supple. JVP to jaw . Carotids 2+ bilat; no bruits. No lymphadenopathy or thryomegaly appreciated. Cor: PMI laterally displaced. IRR. IRR +s3 Lungs: basilar crackles  Abdomen: soft NT +distended. No hepatosplenomegaly. No bruits or masses. Good bowel sounds. Extremities: warm no cyanosis, clubbing, rash, 2+ edema Neuro: alert & orientedx3, cranial nerves grossly intact. moves all  4 extremities w/o difficulty. Affect pleasant   Telemetry: AF 90-115 Personally reviewed  Labs: CBC  Recent Labs  05/08/17 0528 05/09/17 0500  WBC 6.5 5.6  HGB 12.2 12.7  HCT 42.1 43.3  MCV 92.5 92.3  PLT 118* 500*   Basic Metabolic Panel  Recent Labs  05/08/17 0528 05/09/17 0500 05/10/17 1045  NA 142 140 149*  K 4.7 5.0 3.6  CL 95* 94* 107  CO2 37* 37* 34*  GLUCOSE 91 92 133*  BUN 23* 26* 60*  CREATININE 1.48* 1.70* 0.80  CALCIUM 8.6* 8.9 8.3*  MG 1.8  --   --   PHOS 2.6 3.1 3.2   Liver Function Tests  Recent Labs  05/09/17 0500 05/10/17 1045  ALBUMIN 2.9* 1.6*   No results for input(s): LIPASE, AMYLASE in the last 72 hours. Cardiac Enzymes No results for input(s): CKTOTAL, CKMB, CKMBINDEX, TROPONINI in the last 72 hours.  BNP: BNP (last 3 results)  Recent Labs  10/31/16 1246 11/20/16 1121 04/29/17 0935  BNP 1,218.8* 1,555.6* 2,712.1*    ProBNP (last 3 results) No results for input(s): PROBNP in the last 8760 hours.   D-Dimer No results for input(s): DDIMER in the last 72 hours. Hemoglobin A1C No results for input(s): HGBA1C in the last 72 hours. Fasting Lipid Panel No  results for input(s): CHOL, HDL, LDLCALC, TRIG, CHOLHDL, LDLDIRECT in the last 72 hours. Thyroid Function Tests No results for input(s): TSH, T4TOTAL, T3FREE, THYROIDAB in the last 72 hours.  Invalid input(s): FREET3  Other results:  Imaging/Studies: Transesophageal Echocardiography 03/25/17 Study Conclusions  - Left ventricle: Systolic function was severely reduced. The   estimated ejection fraction was 15%. Diffuse hypokinesis. - Aortic valve: No evidence of vegetation. - Mitral valve: No evidence of vegetation. There was mild to   moderate regurgitation. - Left atrium: The atrium was moderately dilated. No evidence of   thrombus in the atrial cavity or appendage. - Right ventricle: The cavity size was mildly dilated. Systolic   function was severely reduced. -  Right atrium: The atrium was mildly dilated. - Atrial septum: No defect or patent foramen ovale was identified. - Tricuspid valve: No evidence of vegetation. There was moderate   regurgitation. - Pulmonic valve: No evidence of vegetation. - Pericardium, extracardiac: A trivial pericardial effusion was   identified.  Impressions:  - Severe LV dysfunction; mild to moderate MR; moderate LAE; no LAA   thrombus; mild RAE and RVE; severely reduced RV function;   moderate TR.   Medications:     Scheduled Medications: . furosemide  80 mg Intravenous BID  . isosorbide-hydrALAZINE  0.5 tablet Oral TID  . potassium chloride  40 mEq Oral BID  . ranolazine  500 mg Oral BID  . rosuvastatin  10 mg Oral Daily  . sodium chloride flush  10-40 mL Intracatheter Q12H  . sodium chloride flush  3 mL Intravenous Q12H  . warfarin  3 mg Oral q1800  . Warfarin - Pharmacist Dosing Inpatient   Does not apply q1800    Infusions: . sodium chloride    . amiodarone 30 mg/hr (05/10/17 0800)  . DOBUTamine 2.5 mcg/kg/min (05/10/17 0800)    PRN Medications: sodium chloride, acetaminophen, ondansetron (ZOFRAN) IV, sodium chloride flush, sodium chloride flush   Assessment/Plan   1. Acute on chronic biventricular CHF: TEE 03/25/17 with LVEF 15% and severely reduced RV function.  - She developed recurrent shock after returning to AF. Now improved with restarting  dobutamine at 2.5. Co-ox 75% - Remains volume overloaded. Continue IV lasix.  - Can absolutely not tolerate AF. Will continue IV amio load and repeat DC-CV onTuesday.  - Continue Bidil 1/2 tab tid.  - No bblocker or ACE/ARB/ARNI with low output and AKI 2. Persistent Afib: Failed multiple DC-CV. Last attempt for DCCV 03/25/17 but had stopped her amiodarone. INR therapeutic.  - She is now in Afib s/p DCCV 05/06/17 - As above. Can absolutely not tolerate AF. Will continue IV amio load and repeat DC-CV onTuesday. INR 2.48 yesterday. Not drawn today.  Continue warfarin. Discussed with PharmD 3. Acute hypercapnic respiratory failure: Suspect OSA at baseline.  Continue CPAP with sleeping.  - Needs sleep study. + Snoring and body habitus pre-disposes for OSA/OHS. - Respiratory status stable today 4. 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   - Creatinine improved with with restarting dobutamine. Though BMET does not look accurate. Recheck in am. Continue to follow  - Baseline creatinine is 1.6 5. COPD: - Stable 6. PT recommend SNF.   - She wants to go home and go back to work so she doesn't lose her job 7. Hypernatremia.  - Not sure this is accurate. Recheck BMET in am,   Glori Bickers, MD 05/10/2017   Advanced Heart Failure Team Pager [phone] (M-F; 7a - 4p)  Please contact [facility] for night-coverage after hours (4p -7a ) and weekends on [email or web address]

## 2017-05-10 NOTE — Progress Notes (Signed)
ANTICOAGULATION CONSULT NOTE - Follow Up Consult  Pharmacy Consult for Coumadin Indication: atrial fibrillation  Allergies  Allergen Reactions  . Ace Inhibitors Palpitations    Patient Measurements: Height: 5\' 5"  (165.1 cm) Weight: 258 lb 6.4 oz (117.2 kg) IBW/kg (Calculated) : 57   Vital Signs: Temp: 98.2 F (36.8 C) (06/17 1215) Temp Source: Oral (06/17 1215) BP: 129/83 (06/17 1215) Pulse Rate: 100 (06/17 1215)  Labs:  Recent Labs  05/08/17 0528 05/09/17 0500 05/10/17 1045  HGB 12.2 12.7  --   HCT 42.1 43.3  --   PLT 118* 128*  --   LABPROT 27.9* 27.3*  --   INR 2.54 2.48  --   CREATININE 1.48* 1.70* 0.80    Estimated Creatinine Clearance: 92.2 mL/min (by C-G formula based on SCr of 0.8 mg/dL).   Assessment:  Anticoag: Coumadin PTA for afib, INR 4.81 on admission INR 2.48 on 6/16, INR not done today., cbc stable -PTA Coumadin dose 3 mg daily (last taken 6/5)  Goal of Therapy:  INR 2-3 Monitor platelets by anticoagulation protocol: Yes   Plan:  Continue home Coumadin 3mg  daily Daily INR  Sunshyne Horvath S. Alford Highland, PharmD, BCPS Clinical Staff Pharmacist Pager [phone] Eilene Ghazi Stilling[place] 05/10/2017,1:52 PM

## 2017-05-11 LAB — RENAL FUNCTION PANEL
Albumin: 2.8 g/dL — ABNORMAL LOW (ref 3.5–5.0)
Anion gap: 8 (ref 5–15)
BUN: 20 mg/dL (ref 6–20)
CO2: 36 mmol/L — ABNORMAL HIGH (ref 22–32)
Calcium: 8.9 mg/dL (ref 8.9–10.3)
Chloride: 95 mmol/L — ABNORMAL LOW (ref 101–111)
Creatinine, Ser: 1.56 mg/dL — ABNORMAL HIGH (ref 0.44–1.00)
GFR calc non Af Amer: 34 mL/min — ABNORMAL LOW (ref >60)
GFR, EST AFRICAN AMERICAN: 40 mL/min — AB (ref >60)
GLUCOSE: 86 mg/dL (ref 65–99)
Phosphorus: 3.1 mg/dL (ref 2.5–4.6)
Potassium: 4.3 mmol/L (ref 3.5–5.1)
SODIUM: 139 mmol/L (ref 135–145)

## 2017-05-11 LAB — PROTIME-INR
INR: 3.23
Prothrombin Time: 33.7 seconds — ABNORMAL HIGH (ref 11.4–15.2)

## 2017-05-11 LAB — COOXEMETRY PANEL
Carboxyhemoglobin: 1.2 % (ref 0.5–1.5)
Methemoglobin: 1.1 % (ref 0.0–1.5)
O2 Saturation: 69.7 %
TOTAL HEMOGLOBIN: 12.3 g/dL (ref 12.0–16.0)

## 2017-05-11 MED ORDER — SODIUM CHLORIDE 0.9 % IV SOLN
250.0000 mL | INTRAVENOUS | Status: DC
  Administered 2017-05-12: 13:00:00 via INTRAVENOUS

## 2017-05-11 MED ORDER — SODIUM CHLORIDE 0.9% FLUSH: 3.0000 mL | INTRAVENOUS | Status: DC | PRN

## 2017-05-11 MED ORDER — ALTEPLASE 2 MG IJ SOLR
2.0000 mg | INTRAMUSCULAR | Status: AC
  Administered 2017-05-11: 2 mg
  Filled 2017-05-11: qty 2

## 2017-05-11 MED ORDER — AMIODARONE LOAD VIA INFUSION
150.0000 mg | Freq: Once | INTRAVENOUS | Status: AC
  Administered 2017-05-11: 150 mg via INTRAVENOUS
  Filled 2017-05-11: qty 83.34

## 2017-05-11 MED ORDER — AMIODARONE IV BOLUS ONLY 150 MG/100ML: 150.0000 mg | Freq: Once | INTRAVENOUS | Status: DC

## 2017-05-11 MED ORDER — SODIUM CHLORIDE 0.9% FLUSH
3.0000 mL | Freq: Two times a day (BID) | INTRAVENOUS | Status: DC
  Administered 2017-05-11 – 2017-05-13 (×4): 3 mL via INTRAVENOUS

## 2017-05-11 MED ORDER — LOPERAMIDE HCL 2 MG PO CAPS
2.0000 mg | ORAL_CAPSULE | ORAL | Status: DC | PRN
  Administered 2017-05-11 – 2017-05-13 (×3): 2 mg via ORAL
  Filled 2017-05-11 (×3): qty 1

## 2017-05-11 MED ORDER — WARFARIN SODIUM 2 MG PO TABS
1.0000 mg | ORAL_TABLET | Freq: Once | ORAL | Status: AC
  Administered 2017-05-11: 1 mg via ORAL
  Filled 2017-05-11: qty 0.5

## 2017-05-11 NOTE — Progress Notes (Signed)
CARDIAC REHAB PHASE I   PRE:  Rate/Rhythm: 99 afib  BP:  Supine:   Sitting: 133/98  Standing:    SaO2: 96% 1L  MODE:  Ambulation: 250 ft   POST:  Rate/Rhythm: up top 150 during walk, back to 115 in room sitting  BP:  Supine:   Sitting: 136/94  Standing:    SaO2: 95% 2L 1117-1143 Pt walked 250 ft on 2L with rolling walker and asst x 2 due to equipment. Pt stated she did not have palpitations or feel SOB with heart rate up to 150 briefly when walking. Mainly 120-140 during walk. Back to recliner after walk and heart rate decreased with rest. Left on 1L. Pt stated she felt that she tolerated well.   Graylon Good, RN BSN  05/11/2017 11:39 AM

## 2017-05-11 NOTE — Progress Notes (Signed)
ANTICOAGULATION CONSULT NOTE - Follow Up Consult  Pharmacy Consult for Coumadin Indication: atrial fibrillation  Allergies  Allergen Reactions  . Ace Inhibitors Palpitations    Patient Measurements: Height: 5\' 5"  (165.1 cm) Weight: 251 lb 12.3 oz (114.2 kg) IBW/kg (Calculated) : 57   Vital Signs: Temp: 98 F (36.7 C) (06/18 0759) Temp Source: Oral (06/18 0759) BP: 145/77 (06/18 0800) Pulse Rate: 83 (06/18 0800)  Labs:  Recent Labs  05/09/17 0500 05/10/17 1045 05/10/17 1720 05/11/17 0612 05/11/17 0613  HGB 12.7  --  11.8*  --   --   HCT 43.3  --  39.7  --   --   PLT 128*  --  139*  --   --   LABPROT 27.3*  --   --   --  33.7*  INR 2.48  --   --   --  3.23  CREATININE 1.70* 0.80 1.62* 1.56*  --     Estimated Creatinine Clearance: 46.6 mL/min (A) (by C-G formula based on SCr of 1.56 mg/dL (H)).   Assessment:  Anticoag: Coumadin PTA for afib, INR 4.81 on admission INR 3.2 this am, cbc stable -PTA Coumadin dose 3 mg daily (last taken 6/5)  Goal of Therapy:  INR 2-3 Monitor platelets by anticoagulation protocol: Yes   Plan:  Warfarin 1mg  tonight Daily INR  Erin Hearing PharmD., BCPS Clinical Pharmacist Pager [phone] 05/11/2017 10:31 AM

## 2017-05-11 NOTE — Progress Notes (Signed)
Physical Therapy Treatment Patient Details Name: Patricia Wagner MRN: [id number] DOB: [birth year] Today's Date: 05/11/2017    History of Present Illness 65 admitted with respiratory failure and heart failure. Plan for cardioversion today, 6/13. PMHx: CHF, AFib, cKD, chronic cor pulmonale, CAD, ICM, obestity, CVA    PT Comments    Pt with improve ambulation tolerance. SpO2 >89% on RA. HR varied between 118-144 during amb. V/c's for safe walker management. Acute PT to con't to follow.   Follow Up Recommendations  Supervision for mobility/OOB;Home health PT;Supervision/Assistance - 24 hour     Equipment Recommendations  Rolling walker with 5" wheels    Recommendations for Other Services       Precautions / Restrictions Precautions Precautions: Fall Restrictions Weight Bearing Restrictions: No    Mobility  Bed Mobility               General bed mobility comments: pt up in chair  Transfers Overall transfer level: Needs assistance Equipment used: None Transfers: Sit to/from Stand Sit to Stand: Min guard         General transfer comment: v/c's for safety, watch out for lines  Ambulation/Gait Ambulation/Gait assistance: Min guard Ambulation Distance (Feet): 200 Feet Assistive device: Rolling walker (2 wheeled) Gait Pattern/deviations: Step-through pattern;Antalgic;Trunk flexed Gait velocity: dec Gait velocity interpretation: Below normal speed for age/gender General Gait Details: pt with c/o of L great toe painful, v/c's for safe walker management especially during turning as pt pushes it out to far in front of self, HR varies from 118-144 due to afib   Stairs            Wheelchair Mobility    Modified Rankin (Stroke Patients Only)       Balance           Standing balance support: During functional activity   Standing balance comment: needs RW for safe amb                            Cognition Arousal/Alertness: Awake/alert Behavior  During Therapy: Flat affect;Impulsive Overall Cognitive Status: Within Functional Limits for tasks assessed                           Safety/Judgement: Decreased awareness of safety            Exercises      General Comments        Pertinent Vitals/Pain Pain Assessment: No/denies pain    Home Living                      Prior Function            PT Goals (current goals can now be found in the care plan section) Acute Rehab PT Goals Patient Stated Goal: home otmorrow Progress towards PT goals: Progressing toward goals    Frequency    Min 3X/week      PT Plan Current plan remains appropriate    Co-evaluation              AM-PAC PT "6 Clicks" Daily Activity  Outcome Measure  Difficulty turning over in bed (including adjusting bedclothes, sheets and blankets)?: None Difficulty moving from lying on back to sitting on the side of the bed? : None Difficulty sitting down on and standing up from a chair with arms (e.g., wheelchair, bedside commode, etc,.)?: None Help needed moving to and from a  bed to chair (including a wheelchair)?: A Little Help needed walking in hospital room?: A Little Help needed climbing 3-5 steps with a railing? : A Lot 6 Click Score: 20    End of Session Equipment Utilized During Treatment: Gait belt;Oxygen Activity Tolerance: Patient tolerated treatment well;Treatment limited secondary to medical complications (Comment) Patient left: in chair;with call bell/phone within reach Nurse Communication: Mobility status PT Visit Diagnosis: Unsteadiness on feet (R26.81);Other abnormalities of gait and mobility (R26.89);Difficulty in walking, not elsewhere classified (R26.2);Muscle weakness (generalized) (M62.81)     Time: 5701-7793 PT Time Calculation (min) (ACUTE ONLY): 20 min  Charges:  $Gait Training: 8-22 mins                    G Codes:      Kittie Plater, PT, DPT Pager #: [phone] Office #:  [phone]    [place] 05/11/2017, 4:28 PM

## 2017-05-11 NOTE — Care Management Note (Signed)
Case Management Note  Patient Details  Name: Patricia Wagner MRN: [id number] Date of Birth: Sep 19, 1951  Subjective/Objective:   Pt admitted for SOB, hypotensive, AKI  Action/Plan:   PTA from home - pt is on BIPAP >24 hours.  Pt was independent from home per daughter.  Possible discharge with inotropes - starting dobutamine.  [place] notified of possible home IV infusion referral.  PT eval will be ordered when pt is medically stable.   Expected Discharge Date:  05/04/17               Expected Discharge Plan:  [address]  In-House Referral:     Discharge planning Services     Post Acute Care Choice:    Choice offered to:     DME Arranged:    DME Agency:     HH Arranged:    HH Agency:     Status of Service:     If discussed at [employer], dates discussed:    Additional Comments: 05/11/2017  Unfortunately pt reverted back into AFib and developed shock - both dobutamine and amiodarone had to be restarted.  Pt is also on IV lasix - scheduled for another Cardioversion 6/19.  CM will continue to follow for discharge needs (SNF vs Home with possible inotropes).  05/08/17 Pt is now s/p cardioversion, dobutamine is off.  However, pt is now in AFIB and IV lasix restarted.  Pt is now refusing PT eval and stating she wants to discharge home.  CM spoke with [place] Luisa Hart [phone] ext [phone] - request call back if pt is to discharge home and needs [facility] - so she can expedite [facility] approval (if infusion is not needed).  CM contacted HF team and requested additonal assessment with pt at bedside as pt is now refusing PT, walking and SNF.  HF NP agrees that pt would be safely discharged to SNF - will discuss directly with pt   05/05/17 Pt will have Cardioversion tomorrow - attending with then attempt to wean her off the dobutamine - if this is not successful pt will need to discharge with inotrope - attending aware that current recommendation is SNF.  CSW  aware that pt may discharge to SNF with inotropic drug.  05/01/17 SNF recommended - CSW consulted  CM contacted [facility] and verified [facility] is in [place] verified both HH and IV infusion orders can be submitted via [facility] directly.  CM will continue to follow for discharge needs.    Pt started on inotrope and IV amio yesterday.  CM contacted [facility] and informed of tentative referral for IV infusion and RN.  Per [facility] - agency will seek insurance auth for both IV infusion and HHRN.   As a back up plan ; CM requested HHRN order from [place] - once order is received CM will verify process with [employer].  Sleep study order form placed on shadow chart - PA aware [name], RN 05/11/2017, 10:51 AM

## 2017-05-11 NOTE — Progress Notes (Signed)
Patient ID: Patricia Wagner, female   DOB: 05-17-1951, 66 y.o.   MRN: [id number]     Primary Cardiologist: Dr. [name]  Subjective:    Admitted 04/29/17 with acute respiratory distress in the setting of atrial fibrillation and volume overload. Hypotensive, started on dobutamine, Amio gtt and IV diuresis. Weight down 37 pounds total.   Transitioned to po Amio 05/03/17, DCCV with return to NSR on 05/06/17, unfortunately went back into Afib on 05/08/17 (IV Amio restarted) thus developed recurrent shock and volume overload.  Dobutamine 2.53mcg restarted 05/09/17. Diuresing well on IV lasix, CVP 10 . Creatinine stable. Co ox 69.7%.   She feels ok, remains fatigued and frustrated but pleasant. Denies chest pain and orthopnea, but does seem dyspneic with talking.    Objective:   Weight Range: 251 lb 12.3 oz (114.2 kg) Body mass index is 41.9 kg/m.   Vital Signs:   Temp:  [97.8 F (36.6 C)-98.3 F (36.8 C)] 97.8 F (36.6 C) (06/18 0411) Pulse Rate:  [52-100] 78 (06/18 0411) Resp:  [17-20] 17 (06/18 0411) BP: (108-138)/(62-83) 138/72 (06/18 0411) SpO2:  [92 %-100 %] 98 % (06/18 0411) Weight:  [251 lb 12.3 oz (114.2 kg)] 251 lb 12.3 oz (114.2 kg) (06/18 0230) Last BM Date: 05/10/17  Weight change: Filed Weights   05/09/17 0451 05/10/17 0534 05/11/17 0230  Weight: 260 lb 4.8 oz (118.1 kg) 258 lb 6.4 oz (117.2 kg) 251 lb 12.3 oz (114.2 kg)    Intake/Output:   Intake/Output Summary (Last 24 hours) at 05/11/17 0746 Last data filed at 05/11/17 0643  Gross per 24 hour  Intake          1350.27 ml  Output             2600 ml  Net         -1249.73 ml     Physical Exam: CVP 10  General: Fatigued appearing female, NAD. Sitting up in bed.  HEENT: normal Neck: supple. 8-9 Cm JVP. Carotids 2+ bilat; no bruits. No thyromegaly or nodule noted. Cor: PMI nondisplaced.Irregular rate and rhythm. + s3. Lungs: Clear bilaterally, dyspneic with talking. Normal effort.  Abdomen: soft, non-tender,  distended, no HSM. No bruits or masses. Bowel sounds present.  Extremities: no cyanosis, clubbing, rash, 1-2+ pedal edema.   Neuro: alert & orientedx3, cranial nerves grossly intact. moves all 4 extremities w/o difficulty. Affect pleasant    Telemetry: Afib rates in the 80's. Personally reviewed   Labs: CBC  Recent Labs  05/09/17 0500 05/10/17 1720  WBC 5.6 5.6  HGB 12.7 11.8*  HCT 43.3 39.7  MCV 92.3 91.3  PLT 128* 532*   Basic Metabolic Panel  Recent Labs  05/10/17 1720 05/11/17 0612  NA 138 139  K 4.1 4.3  CL 94* 95*  CO2 35* 36*  GLUCOSE 132* 86  BUN 23* 20  CREATININE 1.62* 1.56*  CALCIUM 8.6* 8.9  PHOS 2.8 3.1   Liver Function Tests  Recent Labs  05/10/17 1720 05/11/17 0612  ALBUMIN 2.8* 2.8*   No results for input(s): LIPASE, AMYLASE in the last 72 hours. Cardiac Enzymes No results for input(s): CKTOTAL, CKMB, CKMBINDEX, TROPONINI in the last 72 hours.  BNP: BNP (last 3 results)  Recent Labs  10/31/16 1246 11/20/16 1121 04/29/17 0935  BNP 1,218.8* 1,555.6* 2,712.1*     Imaging/Studies: Transesophageal Echocardiography 03/25/17 Study Conclusions  - Left ventricle: Systolic function was severely reduced. The   estimated ejection fraction was 15%. Diffuse hypokinesis. - Aortic valve:  No evidence of vegetation. - Mitral valve: No evidence of vegetation. There was mild to   moderate regurgitation. - Left atrium: The atrium was moderately dilated. No evidence of   thrombus in the atrial cavity or appendage. - Right ventricle: The cavity size was mildly dilated. Systolic   function was severely reduced. - Right atrium: The atrium was mildly dilated. - Atrial septum: No defect or patent foramen ovale was identified. - Tricuspid valve: No evidence of vegetation. There was moderate   regurgitation. - Pulmonic valve: No evidence of vegetation. - Pericardium, extracardiac: A trivial pericardial effusion was    identified.  Impressions:  - Severe LV dysfunction; mild to moderate MR; moderate LAE; no LAA   thrombus; mild RAE and RVE; severely reduced RV function;   moderate TR.   Medications:     Scheduled Medications: . furosemide  80 mg Intravenous BID  . isosorbide-hydrALAZINE  0.5 tablet Oral TID  . potassium chloride  40 mEq Oral BID  . ranolazine  500 mg Oral BID  . rosuvastatin  10 mg Oral Daily  . sodium chloride flush  10-40 mL Intracatheter Q12H  . warfarin  3 mg Oral q1800  . Warfarin - Pharmacist Dosing Inpatient   Does not apply q1800    Infusions: . amiodarone 30 mg/hr (05/11/17 0626)  . DOBUTamine 2.5 mcg/kg/min (05/10/17 1600)    PRN Medications: acetaminophen, loperamide, ondansetron (ZOFRAN) IV, sodium chloride flush   Assessment/Plan   1. Acute on chronic biventricular CHF: TEE 03/25/17 with LVEF 15% and severely reduced RV function.  - Developed recurrent shock with the return of AF, so dobutamine 2.8mcg restarted 05/09/17.  - NYHA III - Volume overloaded on exam.  - Continue IV Lasix 80mg  BID.  - Continue Bidil 1/2 tab TID.  - No beta blocker with low output - No ACE/ARB/ARNI with AKI.  2. Persistent Afib: Cannot tolerate AF with recurrent shock. S/p DCCV on 5/2 (she stopped Amio after this) and DCCV on 05/06/17.  - Plan to continue to load with IV Amio and DCCV tomorrow.  - INR 3.23, on warfarin for anticoagulation.  - This patients CHA2DS2-VASc Score and unadjusted Ischemic Stroke Rate (% per year) is equal to 4.8 % stroke rate/year from a score of 4 Above score calculated as 1 point each if present [CHF, HTN, DM, Vascular=MI/PAD/Aortic Plaque, Age if 65-74, or Female], 2 points each if present [Age > 75, or Stroke/TIA/TE] 3. Acute hypercapnic respiratory failure: Suspect OSA at baseline.  Continue CPAP with sleeping.  - Will need sleep study outpatient.  4. 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   - Baseline creatinine 1.6 -  Stable at 1.56 today.  5. COPD: - No wheezing noted.  6. PT recommend SNF.   - She wants to go home and go back to work so she doesn't lose her job - No change to current plan.    Arbutus Leas, NP 05/11/2017   Advanced Heart Failure Team Pager [phone] (M-F; [place])  Please contact [facility] for night-coverage after hours (4p -7a ) and weekends on [email or web address]  Patient seen and examined with Jettie Booze, NP. We discussed all aspects of the encounter. I agree with the assessment and plan as stated above.   She remains in AF. Not tolerating well. Co-ox improved with dobutamine. CVP still up. Will continue IV diuresis and continue dobutamine for support.   Continue IV amio. Plans for repeat DC-CV in am. Discussed with EP. INR 3.2 today.  Dosing d/w with HF PharmD.   Glori Bickers, MD  10:21 AM

## 2017-05-12 ENCOUNTER — Encounter (HOSPITAL_COMMUNITY)
Admission: EM | Disposition: A | Discharge status: Home Health | Payer: Self-pay | Source: Home / Self Care | Attending: Interventional Cardiology

## 2017-05-12 ENCOUNTER — Inpatient Hospital Stay (HOSPITAL_COMMUNITY): Payer: Managed Care, Other (non HMO) | Admitting: Certified Registered"

## 2017-05-12 HISTORY — PX: CARDIOVERSION: SHX1299

## 2017-05-12 LAB — RENAL FUNCTION PANEL
ANION GAP: 9 (ref 5–15)
Albumin: 2.8 g/dL — ABNORMAL LOW (ref 3.5–5.0)
BUN: 16 mg/dL (ref 6–20)
CHLORIDE: 96 mmol/L — AB (ref 101–111)
CO2: 35 mmol/L — AB (ref 22–32)
CREATININE: 1.49 mg/dL — AB (ref 0.44–1.00)
Calcium: 8.8 mg/dL — ABNORMAL LOW (ref 8.9–10.3)
GFR calc non Af Amer: 36 mL/min — ABNORMAL LOW (ref >60)
GFR, EST AFRICAN AMERICAN: 42 mL/min — AB (ref >60)
Glucose, Bld: 89 mg/dL (ref 65–99)
POTASSIUM: 4.2 mmol/L (ref 3.5–5.1)
Phosphorus: 3.3 mg/dL (ref 2.5–4.6)
Sodium: 140 mmol/L (ref 135–145)

## 2017-05-12 LAB — COOXEMETRY PANEL
CARBOXYHEMOGLOBIN: 1.2 % (ref 0.5–1.5)
METHEMOGLOBIN: 0.9 % (ref 0.0–1.5)
O2 Saturation: 71.9 %
TOTAL HEMOGLOBIN: 13.1 g/dL (ref 12.0–16.0)

## 2017-05-12 LAB — MAGNESIUM: Magnesium: 2 mg/dL (ref 1.7–2.4)

## 2017-05-12 LAB — PROTIME-INR
INR: 3.6
Prothrombin Time: 36.8 seconds — ABNORMAL HIGH (ref 11.4–15.2)

## 2017-05-12 SURGERY — CARDIOVERSION
Anesthesia: General

## 2017-05-12 MED ORDER — ETOMIDATE 2 MG/ML IV SOLN
INTRAVENOUS | Status: DC | PRN
Start: 2017-05-12 — End: 2017-05-12
  Administered 2017-05-12: 20 mg via INTRAVENOUS

## 2017-05-12 MED ORDER — LIDOCAINE HCL (CARDIAC) 20 MG/ML IV SOLN
INTRAVENOUS | Status: DC | PRN
  Administered 2017-05-12 (×2): 50 mg via INTRATRACHEAL

## 2017-05-12 MED ORDER — ISOSORB DINITRATE-HYDRALAZINE 20-37.5 MG PO TABS
1.0000 | ORAL_TABLET | Freq: Three times a day (TID) | ORAL | Status: DC
  Administered 2017-05-12 (×3): 1 via ORAL
  Filled 2017-05-12 (×4): qty 1

## 2017-05-12 NOTE — Progress Notes (Signed)
ANTICOAGULATION CONSULT NOTE - Follow Up Consult  Pharmacy Consult for Coumadin Indication: atrial fibrillation  Allergies  Allergen Reactions  . Ace Inhibitors Palpitations    Patient Measurements: Height: 5\' 5"  (165.1 cm) Weight: 251 lb 11.2 oz (114.2 kg) IBW/kg (Calculated) : 57   Vital Signs: Temp: 98.2 F (36.8 C) (06/19 0743) Temp Source: Oral (06/19 0743) BP: 141/85 (06/19 0743) Pulse Rate: 85 (06/19 0743)  Labs:  Recent Labs  05/10/17 1720 05/11/17 0612 05/11/17 7948 05/12/17 0500  HGB 11.8*  --   --   --   HCT 39.7  --   --   --   PLT 139*  --   --   --   LABPROT  --   --  33.7* 36.8*  INR  --   --  3.23 3.60  CREATININE 1.62* 1.56*  --  1.49*    Estimated Creatinine Clearance: 48.7 mL/min (A) (by C-G formula based on SCr of 1.49 mg/dL (H)).   Assessment:  Anticoag: Coumadin PTA for afib, INR 4.81 on admission INR 3.6 this am, cbc stable, will recheck in am - Plan for cardioversion today  -Unfortunately INR has continued to trend up with lower dose of warfarin last night, will hold for 1 day, will need to be careful with holding doses around cardioversion. She remains on IV amio for now.  -PTA Coumadin dose 3 mg daily (last taken 6/5)  Goal of Therapy:  INR 2-3 Monitor platelets by anticoagulation protocol: Yes   Plan:  Hold warfarin today Daily INR  Erin Hearing PharmD., BCPS Clinical Pharmacist Pager [phone] 05/12/2017 10:56 AM

## 2017-05-12 NOTE — Progress Notes (Signed)
Patient ID: Patricia Wagner, female   DOB: November 25, 1950, 66 y.o.   MRN: [id number]     Primary Cardiologist: Dr. [name]  Subjective:    Admitted 04/29/17 with acute respiratory distress and cardiogenic shock in the setting of atrial fibrillation and volume overload. Started on dobutamine and Amio gtt.   Transitioned to po Amio 05/03/17, DCCV with return to NSR on 05/06/17, unfortunately went back into Afib on 05/08/17 (IV Amio restarted) thus developed recurrent shock and volume overload.  Dobutamine 2.49mcg restarted 05/09/17.   Co ox 72%, CVP 10. Weight down 37 pounds total. Continues to diurese well. For DCCV today.    Objective:   Weight Range:  251 lb 11.2 oz (114.2 kg) Body mass index is 41.89 kg/m.   Vital Signs:   Temp:  [98 F (36.7 C)-98.7 F (37.1 C)] 98.3 F (36.8 C) (06/19 0316) Pulse Rate:  [80-96] 82 (06/19 0316) Resp:  [16-19] 19 (06/19 0316) BP: (109-145)/(65-85) 134/84 (06/19 0316) SpO2:  [93 %-98 %] 93 % (06/19 0316) Weight:  [251 lb 11.2 oz (114.2 kg)] 251 lb 11.2 oz (114.2 kg) (06/19 0316) Last BM Date: 05/11/17  Weight change: Filed Weights   05/10/17 0534 05/11/17 0230 05/12/17 0316  Weight: 258 lb 6.4 oz (117.2 kg) 251 lb 12.3 oz (114.2 kg) 251 lb 11.2 oz (114.2 kg)    Intake/Output:   Intake/Output Summary (Last 24 hours) at 05/12/17 0741 Last data filed at 05/12/17 0600  Gross per 24 hour  Intake           1200.9 ml  Output             2226 ml  Net          -1025.1 ml     Physical Exam: CVP 10  General: Fatigued appearing female. No resp difficulty. HEENT: normal Neck: supple. JVP 7-8 cm. Carotids 2+ bilat; no bruits. No thyromegaly or nodule noted. Cor: PMI nondisplaced. Irregularly irregular. +S3 Lungs: Clear in upper lobes, diminished in bases bilaterally.  Abdomen: soft, non-tender, non distended, no HSM. No bruits or masses. +BS  Extremities: no cyanosis, clubbing, rash, 1+ pedal edema.  Neuro: alert & orientedx3, cranial nerves grossly  intact. moves all 4 extremities w/o difficulty. Affect pleasant     Telemetry: Afib/flutter rate 80-90.    Labs: CBC  Recent Labs  05/10/17 1720  WBC 5.6  HGB 11.8*  HCT 39.7  MCV 91.3  PLT 660*   Basic Metabolic Panel  Recent Labs  05/11/17 0612 05/12/17 0500  NA 139 140  K 4.3 4.2  CL 95* 96*  CO2 36* 35*  GLUCOSE 86 89  BUN 20 16  CREATININE 1.56* 1.49*  CALCIUM 8.9 8.8*  MG  --  2.0  PHOS 3.1 3.3   Liver Function Tests  Recent Labs  05/11/17 0612 05/12/17 0500  ALBUMIN 2.8* 2.8*   No results for input(s): LIPASE, AMYLASE in the last 72 hours. Cardiac Enzymes No results for input(s): CKTOTAL, CKMB, CKMBINDEX, TROPONINI in the last 72 hours.  BNP: BNP (last 3 results)  Recent Labs  10/31/16 1246 11/20/16 1121 04/29/17 0935  BNP 1,218.8* 1,555.6* 2,712.1*     Imaging/Studies: Transesophageal Echocardiography 03/25/17 Study Conclusions  - Left ventricle: Systolic function was severely reduced. The   estimated ejection fraction was 15%. Diffuse hypokinesis. - Aortic valve: No evidence of vegetation. - Mitral valve: No evidence of vegetation. There was mild to   moderate regurgitation. - Left atrium: The atrium was moderately dilated.  No evidence of   thrombus in the atrial cavity or appendage. - Right ventricle: The cavity size was mildly dilated. Systolic   function was severely reduced. - Right atrium: The atrium was mildly dilated. - Atrial septum: No defect or patent foramen ovale was identified. - Tricuspid valve: No evidence of vegetation. There was moderate   regurgitation. - Pulmonic valve: No evidence of vegetation. - Pericardium, extracardiac: A trivial pericardial effusion was   identified.  Impressions:  - Severe LV dysfunction; mild to moderate MR; moderate LAE; no LAA   thrombus; mild RAE and RVE; severely reduced RV function;   moderate TR.   Medications:     Scheduled Medications: . furosemide  80 mg  Intravenous BID  . isosorbide-hydrALAZINE  0.5 tablet Oral TID  . potassium chloride  40 mEq Oral BID  . ranolazine  500 mg Oral BID  . rosuvastatin  10 mg Oral Daily  . sodium chloride flush  10-40 mL Intracatheter Q12H  . sodium chloride flush  3 mL Intravenous Q12H  . Warfarin - Pharmacist Dosing Inpatient   Does not apply q1800    Infusions: . sodium chloride    . amiodarone 30 mg/hr (05/12/17 0237)  . DOBUTamine 2.5 mcg/kg/min (05/11/17 1700)    PRN Medications: acetaminophen, loperamide, ondansetron (ZOFRAN) IV, sodium chloride flush, sodium chloride flush   Assessment/Plan   1. Acute on chronic biventricular CHF: TEE 03/25/17 with LVEF 15% and severely reduced RV function.  - NYHA III - Volume status improving, but remains slightly volume overloaded.  - Weight stable, continue IV lasix today until cardioverted, then will likely be able to transition to po tomorrow.  - Increase Bidil to one tab TID.  - No beta blocker with low output.  - No ACE/ARB/ARNI with AKI.  2. Persistent Afib: Cannot tolerate AF with recurrent shock. S/p DCCV on 5/2 (she stopped Amio after this) and DCCV on 05/06/17.  - For DCCV today, hopefully will hold NSR this time. If not, will need AF ablation.  - INR 3.60  - This patients CHA2DS2-VASc Score and unadjusted Ischemic Stroke Rate (% per year) is equal to 4.8 % stroke rate/year from a score of 4 Above score calculated as 1 point each if present [CHF, HTN, DM, Vascular=MI/PAD/Aortic Plaque, Age if 65-74, or Female], 2 points each if present [Age > 75, or Stroke/TIA/TE] 3. Acute hypercapnic respiratory failure: Suspect OSA at baseline.  Continue CPAP with sleeping.  - Will need to set up sleep study outpatient.  4. 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   - Baseline creatinine 1.6 - Creatinine improved today at 1.49.  5. COPD: - No wheezing noted.  - Stable, no change to current plan.  6. PT recommend SNF.   - Refuses  SNF. Will go home with home health.    Arbutus Leas, NP 05/12/2017   Advanced Heart Failure Team Pager [phone] (M-F; [place])  Please contact [facility] for night-coverage after hours (4p -7a ) and weekends on [email or web address]  Patient seen and examined with Jettie Booze, NP. We discussed all aspects of the encounter. I agree with the assessment and plan as stated above.   Remains in AF but diuresing well on dobutamine. Co-ox improved. CVP 10. Renal function stable. Will continue IV diuresis. For repeat DC-CV today.   INR 3.0. Dosing discussed with PharmD. Will need to maintain NSR for > 48 hours before she can go home as she will decompensate quickly if goes abck into  AF.   Glori Bickers, MD  8:41 AM

## 2017-05-12 NOTE — Anesthesia Preprocedure Evaluation (Signed)
Anesthesia Evaluation  Patient identified by MRN, date of birth, ID band Patient awake    Reviewed: Allergy & Precautions, H&P , NPO status , Patient's Chart, lab work & pertinent test results  Airway Mallampati: II  TM Distance: >3 FB Neck ROM: Full    Dental  (+) Teeth Intact, Dental Advisory Given   Pulmonary shortness of breath and with exertion, Current Smoker,    breath sounds clear to auscultation       Cardiovascular hypertension, + CAD, + Peripheral Vascular Disease and +CHF  + dysrhythmias Atrial Fibrillation  Rhythm:irregular Rate:Normal     Neuro/Psych CVA, Residual Symptoms    GI/Hepatic   Endo/Other  Morbid obesity  Renal/GU Renal InsufficiencyRenal disease     Musculoskeletal  (+) Arthritis ,   Abdominal   Peds  Hematology   Anesthesia Other Findings   Reproductive/Obstetrics                             Anesthesia Physical  Anesthesia Plan  ASA: III  Anesthesia Plan: General   Post-op Pain Management:    Induction: Intravenous  PONV Risk Score and Plan: 2 and Propofol and Treatment may vary due to age or medical condition  Airway Management Planned: Mask  Additional Equipment:   Intra-op Plan:   Post-operative Plan:   Informed Consent: I have reviewed the patients History and Physical, chart, labs and discussed the procedure including the risks, benefits and alternatives for the proposed anesthesia with the patient or authorized representative who has indicated his/her understanding and acceptance.     Plan Discussed with: CRNA and Surgeon  Anesthesia Plan Comments:         Anesthesia Quick Evaluation

## 2017-05-12 NOTE — CV Procedure (Signed)
    DIRECT CURRENT CARDIOVERSION  NAME:  Patricia Wagner   MRN: [id number] DOB:  28-May-1951   ADMIT DATE: 04/29/2017   INDICATIONS: Atrial fibrillation    PROCEDURE:   Informed consent was obtained prior to the procedure. The risks, benefits and alternatives for the procedure were discussed and the patient comprehended these risks. Once an appropriate time out was taken, the patient had the defibrillator pads placed in the anterior and posterior position. The patient then underwent sedation by the anesthesia service. Once an appropriate level of sedation was achieved, the patient received a single biphasic, synchronized 200J shock with prompt conversion to sinus rhythm. No apparent complications.  Glori Bickers, MD  1:17 PM

## 2017-05-12 NOTE — Interval H&P Note (Signed)
History and Physical Interval Note:  05/12/2017 1:16 PM  Patricia Wagner  has presented today for surgery, with the diagnosis of afib  The various methods of treatment have been discussed with the patient and family. After consideration of risks, benefits and other options for treatment, the patient has consented to  Procedure(s): CARDIOVERSION (N/A) as a surgical intervention .  The patient's history has been reviewed, patient examined, no change in status, stable for surgery.  I have reviewed the patient's chart and labs.  Questions were answered to the patient's satisfaction.     Patricia Wagner, Quillian Quince

## 2017-05-12 NOTE — Care Management Note (Addendum)
Case Management Note  Patient Details  Name: Patricia Wagner MRN: [id number] Date of Birth: 1951-03-07  Subjective/Objective:   Pt admitted for SOB, hypotensive, AKI  Action/Plan:   PTA from home - pt is on BIPAP >24 hours.  Pt was independent from home per daughter.  Possible discharge with inotropes - starting dobutamine.  [place] notified of possible home IV infusion referral.  PT eval will be ordered when pt is medically stable.   Expected Discharge Date:  05/04/17               Expected Discharge Plan:  [place]  In-House Referral:     Discharge planning Services     Post Acute Care Choice:    Choice offered to:     DME Arranged:    DME Agency:     HH Arranged:    HH Agency:     Status of Service:     If discussed at [employer], dates discussed:    Additional Comments: 05/12/2017  CM contacted [employer] to request expedited [facility] authorization - CM confirmed with HF PA that the currently plan is to attempt to wean pt of inotrope's prior to discharge.  CM was able to gain authorization for [facility] HF orders (HF RN and other modalities as determined necessary per RN initial visit) through [place] for coverage starting 05/15/17 for approximately 21 days - attending will be able to request more visits if needed.  CM faxed H&P, HHHF (including [place] for initial assessment)  order and demographics order to [phone] today.  [employer] auth Intake ID# [id number] and authorization number [id number] - with services beginning the day of or day after discharge -  Pt offered choice for HHRN/HHHF  and pt chose [facility] - agency contacted and referral accepted for HHRN/HHHF orders.  CM instructed by [employer] to contact [facility] on day of discharge to inform of discharge.  CM again requested attending service to complete sleep study form on shadow chart so study can be arranged.  CM also requested ambulatory note for home oxygen if needed - choice offered pt chose [facility]  however no order written yet, agency accepted referral  Successful Cardioversion performed today - pt is now in NSR  Pt discussed in LOS 6/19 - remains appropriate for continued stay.  Pt remains on dobutamine drip - planned redo on cardioversion today.  Pt is now refusing SNF and wants to go home with [facility].  CM will continue to follow for discharge needs.  [facility] following for possible IV inotrope needs post discharge.  CM contacted [employer] CM and informed of pending discharge home with HH/infusion needs once pt is NSR for 48 hours  05/11/17 Unfortunately pt reverted back into AFib and developed shock - both dobutamine and amiodarone had to be restarted.  Pt is also on IV lasix - scheduled for another Cardioversion 6/19.  CM will continue to follow for discharge needs (SNF vs Home with possible inotropes).  05/08/17 Pt is now s/p cardioversion, dobutamine is off.  However, pt is now in AFIB and IV lasix restarted.  Pt is now refusing PT eval and stating she wants to discharge home.  CM spoke with [place] Luisa Hart [phone] ext [phone] - request call back if pt is to discharge home and needs [name] - so she can expedite [facility] approval (if infusion is not needed).  CM contacted HF team and requested additonal assessment with pt at bedside  as pt is now refusing PT, walking and SNF.  HF NP agrees that pt would be safely discharged to SNF - will discuss directly with pt   05/05/17 Pt will have Cardioversion tomorrow - attending with then attempt to wean her off the dobutamine - if this is not successful pt will need to discharge with inotrope - attending aware that current recommendation is SNF.  CSW aware that pt may discharge to SNF with inotropic drug.  05/01/17 SNF recommended - CSW consulted  CM contacted [employer] and verified [facility] is in [place] verified both HH and IV infusion orders can be submitted via [facility] directly.  CM will continue to follow for discharge needs.    Pt  started on inotrope and IV amio yesterday.  CM contacted [facility] and informed of tentative referral for IV infusion and RN.  Per [facility] - agency will seek insurance auth for both IV infusion and HHRN.   As a back up plan ; CM requested HHRN order from PA - once order is received CM will verify process with [employer].  Sleep study order form placed on shadow chart - PA aware [name], RN 05/12/2017, 9:32 AM

## 2017-05-12 NOTE — Anesthesia Procedure Notes (Signed)
Procedure Name: General with mask airway Date/Time: 05/12/2017 12:59 PM Performed by: Lavell Luster Pre-anesthesia Checklist: Patient identified, Emergency Drugs available, Suction available, Patient being monitored and Timeout performed Patient Re-evaluated:Patient Re-evaluated prior to inductionOxygen Delivery Method: Circle system utilized Preoxygenation: Pre-oxygenation with 100% oxygen Intubation Type: IV induction Ventilation: Mask ventilation without difficulty Placement Confirmation: breath sounds checked- equal and bilateral Dental Injury: Teeth and Oropharynx as per pre-operative assessment

## 2017-05-12 NOTE — Transfer of Care (Signed)
Immediate Anesthesia Transfer of Care Note  Patient: Patricia Wagner  Procedure(s) Performed: Procedure(s): CARDIOVERSION (N/A)  Patient Location: Endoscopy Unit  Anesthesia Type:General  Level of Consciousness: awake, alert  and sedated  Airway & Oxygen Therapy: Patient connected to nasal cannula oxygen  Post-op Assessment: Post -op Vital signs reviewed and stable  Post vital signs: stable  Last Vitals:  Vitals:   05/12/17 1146 05/12/17 1238  BP: 122/73 121/66  Pulse: 96   Resp: (!) 24 17  Temp: 36.7 C 36.7 C    Last Pain:  Vitals:   05/12/17 1238  TempSrc: Oral  PainSc:       Patients Stated Pain Goal: 0 (40/08/67 6195)  Complications: No apparent anesthesia complications

## 2017-05-12 NOTE — H&P (View-Only) (Signed)
Patient ID: Patricia Wagner, female   DOB: [birth year], 66 y.o.   MRN: [id number]     Primary Cardiologist: Dr. [name]  Subjective:    Admitted 04/29/17 with acute respiratory distress and cardiogenic shock in the setting of atrial fibrillation and volume overload. Started on dobutamine and Amio gtt.   Transitioned to po Amio 05/03/17, DCCV with return to NSR on 05/06/17, unfortunately went back into Afib on 05/08/17 (IV Amio restarted) thus developed recurrent shock and volume overload.  Dobutamine 2.78mcg restarted 05/09/17.   Co ox 72%, CVP 10. Weight down 37 pounds total. Continues to diurese well. For DCCV today.    Objective:   Weight Range:  251 lb 11.2 oz (114.2 kg) Body mass index is 41.89 kg/m.   Vital Signs:   Temp:  [98 F (36.7 C)-98.7 F (37.1 C)] 98.3 F (36.8 C) (06/19 0316) Pulse Rate:  [80-96] 82 (06/19 0316) Resp:  [16-19] 19 (06/19 0316) BP: (109-145)/(65-85) 134/84 (06/19 0316) SpO2:  [93 %-98 %] 93 % (06/19 0316) Weight:  [251 lb 11.2 oz (114.2 kg)] 251 lb 11.2 oz (114.2 kg) (06/19 0316) Last BM Date: 05/11/17  Weight change: Filed Weights   05/10/17 0534 05/11/17 0230 05/12/17 0316  Weight: 258 lb 6.4 oz (117.2 kg) 251 lb 12.3 oz (114.2 kg) 251 lb 11.2 oz (114.2 kg)    Intake/Output:   Intake/Output Summary (Last 24 hours) at 05/12/17 0741 Last data filed at 05/12/17 0600  Gross per 24 hour  Intake           1200.9 ml  Output             2226 ml  Net          -1025.1 ml     Physical Exam: CVP 10  General: Fatigued appearing female. No resp difficulty. HEENT: normal Neck: supple. JVP 7-8 cm. Carotids 2+ bilat; no bruits. No thyromegaly or nodule noted. Cor: PMI nondisplaced. Irregularly irregular. +S3 Lungs: Clear in upper lobes, diminished in bases bilaterally.  Abdomen: soft, non-tender, non distended, no HSM. No bruits or masses. +BS  Extremities: no cyanosis, clubbing, rash, 1+ pedal edema.  Neuro: alert & orientedx3, cranial nerves grossly  intact. moves all 4 extremities w/o difficulty. Affect pleasant     Telemetry: Afib/flutter rate 80-90.    Labs: CBC  Recent Labs  05/10/17 1720  WBC 5.6  HGB 11.8*  HCT 39.7  MCV 91.3  PLT 409*   Basic Metabolic Panel  Recent Labs  05/11/17 0612 05/12/17 0500  NA 139 140  K 4.3 4.2  CL 95* 96*  CO2 36* 35*  GLUCOSE 86 89  BUN 20 16  CREATININE 1.56* 1.49*  CALCIUM 8.9 8.8*  MG  --  2.0  PHOS 3.1 3.3   Liver Function Tests  Recent Labs  05/11/17 0612 05/12/17 0500  ALBUMIN 2.8* 2.8*   No results for input(s): LIPASE, AMYLASE in the last 72 hours. Cardiac Enzymes No results for input(s): CKTOTAL, CKMB, CKMBINDEX, TROPONINI in the last 72 hours.  BNP: BNP (last 3 results)  Recent Labs  10/31/16 1246 11/20/16 1121 04/29/17 0935  BNP 1,218.8* 1,555.6* 2,712.1*     Imaging/Studies: Transesophageal Echocardiography 03/25/17 Study Conclusions  - Left ventricle: Systolic function was severely reduced. The   estimated ejection fraction was 15%. Diffuse hypokinesis. - Aortic valve: No evidence of vegetation. - Mitral valve: No evidence of vegetation. There was mild to   moderate regurgitation. - Left atrium: The atrium was moderately dilated.  No evidence of   thrombus in the atrial cavity or appendage. - Right ventricle: The cavity size was mildly dilated. Systolic   function was severely reduced. - Right atrium: The atrium was mildly dilated. - Atrial septum: No defect or patent foramen ovale was identified. - Tricuspid valve: No evidence of vegetation. There was moderate   regurgitation. - Pulmonic valve: No evidence of vegetation. - Pericardium, extracardiac: A trivial pericardial effusion was   identified.  Impressions:  - Severe LV dysfunction; mild to moderate MR; moderate LAE; no LAA   thrombus; mild RAE and RVE; severely reduced RV function;   moderate TR.   Medications:     Scheduled Medications: . furosemide  80 mg  Intravenous BID  . isosorbide-hydrALAZINE  0.5 tablet Oral TID  . potassium chloride  40 mEq Oral BID  . ranolazine  500 mg Oral BID  . rosuvastatin  10 mg Oral Daily  . sodium chloride flush  10-40 mL Intracatheter Q12H  . sodium chloride flush  3 mL Intravenous Q12H  . Warfarin - Pharmacist Dosing Inpatient   Does not apply q1800    Infusions: . sodium chloride    . amiodarone 30 mg/hr (05/12/17 0237)  . DOBUTamine 2.5 mcg/kg/min (05/11/17 1700)    PRN Medications: acetaminophen, loperamide, ondansetron (ZOFRAN) IV, sodium chloride flush, sodium chloride flush   Assessment/Plan   1. Acute on chronic biventricular CHF: TEE 03/25/17 with LVEF 15% and severely reduced RV function.  - NYHA III - Volume status improving, but remains slightly volume overloaded.  - Weight stable, continue IV lasix today until cardioverted, then will likely be able to transition to po tomorrow.  - Increase Bidil to one tab TID.  - No beta blocker with low output.  - No ACE/ARB/ARNI with AKI.  2. Persistent Afib: Cannot tolerate AF with recurrent shock. S/p DCCV on 5/2 (she stopped Amio after this) and DCCV on 05/06/17.  - For DCCV today, hopefully will hold NSR this time. If not, will need AF ablation.  - INR 3.60  - This patients CHA2DS2-VASc Score and unadjusted Ischemic Stroke Rate (% per year) is equal to 4.8 % stroke rate/year from a score of 4 Above score calculated as 1 point each if present [CHF, HTN, DM, Vascular=MI/PAD/Aortic Plaque, Age if 65-74, or Female], 2 points each if present [Age > 75, or Stroke/TIA/TE] 3. Acute hypercapnic respiratory failure: Suspect OSA at baseline.  Continue CPAP with sleeping.  - Will need to set up sleep study outpatient.  4. 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   - Baseline creatinine 1.6 - Creatinine improved today at 1.49.  5. COPD: - No wheezing noted.  - Stable, no change to current plan.  6. PT recommend SNF.   - Refuses  SNF. Will go home with home health.    Arbutus Leas, NP 05/12/2017   Advanced Heart Failure Team Pager [phone] (M-F; [place])  Please contact [facility] for night-coverage after hours (4p -7a ) and weekends on [email or web address]  Patient seen and examined with Jettie Booze, NP. We discussed all aspects of the encounter. I agree with the assessment and plan as stated above.   Remains in AF but diuresing well on dobutamine. Co-ox improved. CVP 10. Renal function stable. Will continue IV diuresis. For repeat DC-CV today.   INR 3.0. Dosing discussed with PharmD. Will need to maintain NSR for > 48 hours before she can go home as she will decompensate quickly if goes abck into  AF.   Glori Bickers, MD  8:41 AM

## 2017-05-13 ENCOUNTER — Encounter (HOSPITAL_COMMUNITY)
Admission: EM | Disposition: A | Discharge status: Home Health | Payer: Self-pay | Source: Home / Self Care | Attending: Interventional Cardiology

## 2017-05-13 ENCOUNTER — Encounter (HOSPITAL_COMMUNITY): Payer: Self-pay | Admitting: Internal Medicine

## 2017-05-13 LAB — COOXEMETRY PANEL
Carboxyhemoglobin: 1.1 % (ref 0.5–1.5)
Methemoglobin: 0.9 % (ref 0.0–1.5)
O2 Saturation: 67.5 %
Total hemoglobin: 14.8 g/dL (ref 12.0–16.0)

## 2017-05-13 LAB — RENAL FUNCTION PANEL
Albumin: 3.1 g/dL — ABNORMAL LOW (ref 3.5–5.0)
Anion gap: 10 (ref 5–15)
BUN: 14 mg/dL (ref 6–20)
CHLORIDE: 95 mmol/L — AB (ref 101–111)
CO2: 34 mmol/L — AB (ref 22–32)
CREATININE: 1.62 mg/dL — AB (ref 0.44–1.00)
Calcium: 9 mg/dL (ref 8.9–10.3)
GFR calc Af Amer: 38 mL/min — ABNORMAL LOW (ref >60)
GFR calc non Af Amer: 33 mL/min — ABNORMAL LOW (ref >60)
GLUCOSE: 91 mg/dL (ref 65–99)
Phosphorus: 3.3 mg/dL (ref 2.5–4.6)
Potassium: 4.2 mmol/L (ref 3.5–5.1)
SODIUM: 139 mmol/L (ref 135–145)

## 2017-05-13 LAB — PROTIME-INR
INR: 3.34
Prothrombin Time: 34.7 seconds — ABNORMAL HIGH (ref 11.4–15.2)

## 2017-05-13 LAB — CBC
HEMATOCRIT: 41.2 % (ref 36.0–46.0)
Hemoglobin: 12 g/dL (ref 12.0–15.0)
MCH: 26.5 pg (ref 26.0–34.0)
MCHC: 29.1 g/dL — ABNORMAL LOW (ref 30.0–36.0)
MCV: 91.2 fL (ref 78.0–100.0)
PLATELETS: 155 10*3/uL (ref 150–400)
RBC: 4.52 MIL/uL (ref 3.87–5.11)
RDW: 16.9 % — ABNORMAL HIGH (ref 11.5–15.5)
WBC: 5.1 10*3/uL (ref 4.0–10.5)

## 2017-05-13 SURGERY — CARDIOVERSION
Anesthesia: General

## 2017-05-13 MED ORDER — ISOSORB DINITRATE-HYDRALAZINE 20-37.5 MG PO TABS
2.0000 | ORAL_TABLET | Freq: Three times a day (TID) | ORAL | Status: DC
  Administered 2017-05-13 – 2017-05-16 (×10): 2 via ORAL
  Filled 2017-05-13 (×9): qty 2

## 2017-05-13 MED ORDER — WARFARIN SODIUM 2 MG PO TABS
1.0000 mg | ORAL_TABLET | Freq: Once | ORAL | Status: AC
  Administered 2017-05-13: 1 mg via ORAL
  Filled 2017-05-13: qty 0.5

## 2017-05-13 MED ORDER — POTASSIUM CHLORIDE CRYS ER 20 MEQ PO TBCR
40.0000 meq | EXTENDED_RELEASE_TABLET | Freq: Every day | ORAL | Status: DC
  Administered 2017-05-13 – 2017-05-16 (×4): 40 meq via ORAL
  Filled 2017-05-13 (×4): qty 2

## 2017-05-13 MED ORDER — FUROSEMIDE 10 MG/ML IJ SOLN
80.0000 mg | Freq: Once | INTRAMUSCULAR | Status: DC
  Filled 2017-05-13: qty 8

## 2017-05-13 MED ORDER — FUROSEMIDE 20 MG PO TABS
60.0000 mg | ORAL_TABLET | Freq: Two times a day (BID) | ORAL | Status: DC
  Administered 2017-05-14: 60 mg via ORAL
  Filled 2017-05-13: qty 1

## 2017-05-13 MED ORDER — SPIRONOLACTONE 25 MG PO TABS
12.5000 mg | ORAL_TABLET | Freq: Every day | ORAL | Status: DC
  Administered 2017-05-13 – 2017-05-14 (×2): 12.5 mg via ORAL
  Filled 2017-05-13 (×2): qty 1

## 2017-05-13 NOTE — Progress Notes (Signed)
Physical Therapy Treatment Patient Details Name: Patricia Wagner MRN: [id number] DOB: Dec 14, 1950 Today's Date: 05/13/2017    History of Present Illness 43 admitted with respiratory failure and heart failure. Successful cardioversion. PMHx: CHF, AFib, CKD, chronic cor pulmonale, CAD, ICM, obestity, CVA    PT Comments    Patient progressing well toward PT goals. Tolerated gait training with RW vs SPC today. Balance is better with RW however pt prefers [facility] as this is what she was using PTA. No LOB with SPC. Will continue to work on gait training with [place] as tolerated. Pt's HR ranging from 80s-100s bpm now in NSR. Will continue to follow. Encouraged ambulation later tonight.    Follow Up Recommendations  Supervision for mobility/OOB;Home health PT;Supervision/Assistance - 24 hour     Equipment Recommendations  Rolling walker with 5" wheels    Recommendations for Other Services       Precautions / Restrictions Precautions Precautions: Fall Restrictions Weight Bearing Restrictions: No    Mobility  Bed Mobility               General bed mobility comments: pt up in chair  Transfers Overall transfer level: Needs assistance Equipment used: Rolling walker (2 wheeled) Transfers: Sit to/from Stand Sit to Stand: Min guard         General transfer comment: v/c's for safety, watch out for lines.  Ambulation/Gait Ambulation/Gait assistance: Min guard Ambulation Distance (Feet): 240 Feet Assistive device: Rolling walker (2 wheeled) Gait Pattern/deviations: Step-through pattern;Antalgic;Trunk flexed Gait velocity: dec Gait velocity interpretation: Below normal speed for age/gender General Gait Details: VC's for RW management/safety as pt tends to push it too far anterior. Gait training with RW vs SPC. Mild unsteady with SPC which improved with distance. HR ranged from 80s-100s NSR.   Stairs            Wheelchair Mobility    Modified Rankin (Stroke Patients Only)        Balance Overall balance assessment: Needs assistance Sitting-balance support: Feet supported;No upper extremity supported Sitting balance-Leahy Scale: Good     Standing balance support: During functional activity Standing balance-Leahy Scale: Fair Standing balance comment: Needs UE support in standing.                            Cognition Arousal/Alertness: Awake/alert Behavior During Therapy: Impulsive Overall Cognitive Status: Within Functional Limits for tasks assessed                                        Exercises      General Comments        Pertinent Vitals/Pain Pain Assessment: No/denies pain    Home Living                      Prior Function            PT Goals (current goals can now be found in the care plan section) Progress towards PT goals: Progressing toward goals    Frequency    Min 3X/week      PT Plan Current plan remains appropriate    Co-evaluation              AM-PAC PT "6 Clicks" Daily Activity  Outcome Measure  Difficulty turning over in bed (including adjusting bedclothes, sheets and blankets)?: None Difficulty moving from lying on back  to sitting on the side of the bed? : None Difficulty sitting down on and standing up from a chair with arms (e.g., wheelchair, bedside commode, etc,.)?: None Help needed moving to and from a bed to chair (including a wheelchair)?: None Help needed walking in hospital room?: A Little Help needed climbing 3-5 steps with a railing? : A Lot 6 Click Score: 21    End of Session Equipment Utilized During Treatment: Gait belt Activity Tolerance: Patient tolerated treatment well Patient left: in chair;with call bell/phone within reach Nurse Communication: Mobility status PT Visit Diagnosis: Unsteadiness on feet (R26.81);Other abnormalities of gait and mobility (R26.89);Difficulty in walking, not elsewhere classified (R26.2);Muscle weakness (generalized)  (M62.81)     Time: 6720-9470 PT Time Calculation (min) (ACUTE ONLY): 15 min  Charges:  $Gait Training: 8-22 mins                    G Codes:       Wray Kearns, PT, DPT [phone]     [place] 05/13/2017, 3:41 PM

## 2017-05-13 NOTE — Anesthesia Postprocedure Evaluation (Addendum)
Anesthesia Post Note  Patient: Patricia Wagner  Procedure(s) Performed: Procedure(s) (LRB): CARDIOVERSION (N/A)     Patient location during evaluation: Endoscopy Anesthesia Type: General Level of consciousness: awake Pain management: pain level controlled Vital Signs Assessment: post-procedure vital signs reviewed and stable Respiratory status: spontaneous breathing Cardiovascular status: stable Postop Assessment: no signs of nausea or vomiting Anesthetic complications: no    Last Vitals:  Vitals:   05/13/17 0700 05/13/17 1100  BP: 133/76 125/81  Pulse: 68 66  Resp: 20 (!) 24  Temp: 36.9 C 36.8 C    Last Pain:  Vitals:   05/13/17 1100  TempSrc: Oral  PainSc:    Pain Goal: Patients Stated Pain Goal: 0 (05/01/17 1500)               Choya Tornow JR,JOHN Son Barkan

## 2017-05-13 NOTE — Progress Notes (Signed)
Occupational Therapy Treatment Patient Details Name: Patricia Wagner MRN: [id number] DOB: 1951-11-04 Today's Date: 05/13/2017    History of present illness 66 y.o. female admitted with respiratory failure and heart failure. Successful cardioversion. PMHx: CHF, AFib, CKD, chronic cor pulmonale, CAD, ICM, obestity, CVA   OT comments  Pt demonstrating good progress toward OT goals. She was able to complete standing grooming tasks and toilet transfers with supervision this session. Educated pt on safe tub transfers and she was able to complete with min guard assist utilizing RW. Pt declines use of tub/shower seat and will continue to progress with this task to improve safety. She is demonstrating good functional improvement and improved safety with ADL. Updated D/C recommendation to home health OT services accordingly. Continue to feel she would best benefit from 24 hour assistance initially. OT will continue to follow while admitted.    Follow Up Recommendations  Supervision/Assistance - 24 hour;Home health OT    Equipment Recommendations  3 in 1 bedside commode;Tub/shower seat    Recommendations for Other Services      Precautions / Restrictions Precautions Precautions: Fall Precaution Comments: watch O2 sats Restrictions Weight Bearing Restrictions: No       Mobility Bed Mobility               General bed mobility comments: OOB in chair on arrival  Transfers Overall transfer level: Needs assistance Equipment used: Rolling walker (2 wheeled) Transfers: Sit to/from Stand Sit to Stand: Min guard         General transfer comment: v/c's for safety, watch out for lines.    Balance Overall balance assessment: Needs assistance Sitting-balance support: Feet supported;No upper extremity supported Sitting balance-Leahy Scale: Good     Standing balance support: During functional activity Standing balance-Leahy Scale: Fair Standing balance comment: Able to statically stand  without UE support for ADL at sink but reliant on B UE support from RW for stability during functional mobility.                            ADL either performed or assessed with clinical judgement   ADL Overall ADL's : Needs assistance/impaired     Grooming: Supervision/safety;Standing                   Toilet Transfer: Supervision/safety;Ambulation;RW       Tub/ Shower Transfer: Min guard;Ambulation;Tub transfer   Functional mobility during ADLs: Supervision/safety;Rolling walker General ADL Comments: Pt educated on safe shower transfers. She declines use of shower seat adamantly.       Vision   Vision Assessment?: No apparent visual deficits   Perception     Praxis      Cognition Arousal/Alertness: Awake/alert Behavior During Therapy: Impulsive Overall Cognitive Status: Within Functional Limits for tasks assessed                                 General Comments: Pt particular about line placement and setup but this is likely baseline.         Exercises     Shoulder Instructions       General Comments VSS throughout with HR ranging from 60s-80s.    Pertinent Vitals/ Pain       Pain Assessment: No/denies pain  Home Living  Prior Functioning/Environment              Frequency  Min 2X/week        Progress Toward Goals  OT Goals(current goals can now be found in the care plan section)  Progress towards OT goals: Progressing toward goals  Acute Rehab OT Goals Patient Stated Goal: home tomorrow OT Goal Formulation: With patient Time For Goal Achievement: 05/17/17 Potential to Achieve Goals: Good ADL Goals Pt Will Perform Grooming: with modified independence;standing Pt Will Perform Upper Body Dressing: with modified independence;standing Pt Will Perform Lower Body Dressing: with modified independence;sit to/from stand Pt Will Transfer to Toilet: with  modified independence;ambulating (handicapped height toilet) Pt Will Perform Toileting - Clothing Manipulation and hygiene: with modified independence;sit to/from stand Pt Will Perform Tub/Shower Transfer: Tub transfer;tub bench;ambulating;rolling walker Pt/caregiver will Perform Home Exercise Program: Increased strength;Both right and left upper extremity;With written HEP provided;Independently Additional ADL Goal #1: Pt will independently incorporate 3 strategies to conserve energy during daily ADL routine.  Plan Discharge plan needs to be updated    Co-evaluation                 AM-PAC PT "6 Clicks" Daily Activity     Outcome Measure   Help from another person eating meals?: None Help from another person taking care of personal grooming?: A Little Help from another person toileting, which includes using toliet, bedpan, or urinal?: A Little Help from another person bathing (including washing, rinsing, drying)?: A Little Help from another person to put on and taking off regular upper body clothing?: None Help from another person to put on and taking off regular lower body clothing?: A Little 6 Click Score: 20    End of Session Equipment Utilized During Treatment: Rolling walker  OT Visit Diagnosis: Unsteadiness on feet (R26.81);Muscle weakness (generalized) (M62.81)   Activity Tolerance Patient tolerated treatment well   Patient Left in chair;with call bell/phone within reach;with family/visitor present   Nurse Communication Mobility status        Time: 0601-5615 OT Time Calculation (min): 29 min  Charges: OT General Charges $OT Visit: 1 Procedure OT Treatments $Self Care/Home Management : 23-37 mins  Norman Herrlich, MS OTR/L  Pager: Patricia Wagner 05/13/2017, 4:02 PM

## 2017-05-13 NOTE — Care Management Note (Signed)
Case Management Note  Patient Details  Name: Patricia Wagner MRN: [id number] Date of Birth: November 24, 1951  Subjective/Objective:   Pt admitted for SOB, hypotensive, AKI  Action/Plan:   PTA from home - pt is on BIPAP >24 hours.  Pt was independent from home per daughter.  Possible discharge with inotropes - starting dobutamine.  [facility] notified of possible home IV infusion referral.  PT eval will be ordered when pt is medically stable.   Expected Discharge Date:  05/04/17               Expected Discharge Plan:  [place]  In-House Referral:     Discharge planning Services     Post Acute Care Choice:    Choice offered to:     DME Arranged:    DME Agency:     HH Arranged:    HH Agency:     Status of Service:     If discussed at [employer], dates discussed:    Additional Comments: 05/13/2017  [facility] will not be able to initiate services in the home until 6/25; agency will contact pt via phone and enroll pt is telecare program that will closely monitor pt daily if discharged over the weekend.  HF team is in agreement with this plan and will also closely monitor pt via HF protocol.  05/12/17 CM contacted [employer] to request expedited [facility] authorization - CM confirmed with HF PA that the currently plan is to attempt to wean pt of inotrope's prior to discharge.  CM was able to gain authorization for [facility] HF orders (HF RN and other modalities as determined necessary per RN initial visit) through [employer] for coverage starting 05/15/17 for approximately 21 days - attending will be able to request more visits if needed.  CM faxed H&P, HHHF (including [place] for initial assessment)  order and demographics order to [phone] today.  [employer] auth Intake ID# [id number] and authorization number [id number] - with services beginning the day of or day after discharge -  Pt offered choice for HHRN/HHHF  and pt chose [facility] - agency contacted and referral accepted for HHRN/HHHF  orders.  CM instructed by [employer] to contact [facility] on day of discharge to inform of discharge.  CM again requested attending service to complete sleep study form on shadow chart so study can be arranged.  CM also requested ambulatory note for home oxygen if needed - choice offered pt chose [facility] however no order written yet, agency accepted referral  Successful Cardioversion performed today - pt is now in NSR  Pt discussed in LOS 6/19 - remains appropriate for continued stay.  Pt remains on dobutamine drip - planned redo on cardioversion today.  Pt is now refusing SNF and wants to go home with [facility].  CM will continue to follow for discharge needs.  [facility] following for possible IV inotrope needs post discharge.  CM contacted [employer] CM and informed of pending discharge home with HH/infusion needs once pt is NSR for 48 hours  05/11/17 Unfortunately pt reverted back into AFib and developed shock - both dobutamine and amiodarone had to be restarted.  Pt is also on IV lasix - scheduled for another Cardioversion 6/19.  CM will continue to follow for discharge needs (SNF vs Home with possible inotropes).  05/08/17 Pt is now s/p cardioversion, dobutamine is off.  However, pt is now in AFIB and IV lasix restarted.  Pt is now refusing PT eval and  stating she wants to discharge home.  CM spoke with [place] Luisa Hart [phone] ext [phone] - request call back if pt is to discharge home and needs [facility] - so she can expedite [facility] approval (if infusion is not needed).  CM contacted HF team and requested additonal assessment with pt at bedside as pt is now refusing PT, walking and SNF.  HF NP agrees that pt would be safely discharged to SNF - will discuss directly with pt   05/05/17 Pt will have Cardioversion tomorrow - attending with then attempt to wean her off the dobutamine - if this is not successful pt will need to discharge with inotrope - attending aware that current recommendation is SNF.  CSW aware that pt  may discharge to SNF with inotropic drug.  05/01/17 SNF recommended - CSW consulted  CM contacted [employer] and verified [facility] is in [place] verified both HH and IV infusion orders can be submitted via [facility] directly.  CM will continue to follow for discharge needs.    Pt started on inotrope and IV amio yesterday.  CM contacted [facility] and informed of tentative referral for IV infusion and RN.  Per [facility] - agency will seek insurance auth for both IV infusion and HHRN.   As a back up plan ; CM requested HHRN order from PA - once order is received CM will verify process with [employer].  Sleep study order form placed on shadow chart - PA aware [name], RN 05/13/2017, 8:49 AM

## 2017-05-13 NOTE — Progress Notes (Signed)
ANTICOAGULATION CONSULT NOTE - Follow Up Consult  Pharmacy Consult for Coumadin Indication: atrial fibrillation  Allergies  Allergen Reactions  . Ace Inhibitors Palpitations    Patient Measurements: Height: 5\' 5"  (165.1 cm) Weight: 247 lb 12.8 oz (112.4 kg) IBW/kg (Calculated) : 57  Vital Signs: Temp: 98.5 F (36.9 C) (06/20 0700) Temp Source: Oral (06/20 0700) BP: 133/76 (06/20 0700) Pulse Rate: 68 (06/20 0700)  Labs:  Recent Labs  05/10/17 1720 05/11/17 0612 05/11/17 0613 05/12/17 0500 05/13/17 0525  HGB 11.8*  --   --   --  12.0  HCT 39.7  --   --   --  41.2  PLT 139*  --   --   --  155  LABPROT  --   --  33.7* 36.8* 34.7*  INR  --   --  3.23 3.60 3.34  CREATININE 1.62* 1.56*  --  1.49* 1.62*    Estimated Creatinine Clearance: 44.4 mL/min (A) (by C-G formula based on SCr of 1.62 mg/dL (H)).  Assessment: 65yof continues on coumadin for afib, s/p DCCV on 6/13 and again yesterday. Maintaining NSR now on IV amiodarone. INR is slightly above goal at 3.34 - dose held yesterday. Will give small dose tonight to prevent INR from falling too much with recent DCCV. CBC stable. No bleeding.  Goal of Therapy:  INR 2-3 Monitor platelets by anticoagulation protocol: Yes   Plan:  1) Coumadin 1mg  tonight 2) Daily INR  Deboraha Sprang 05/13/2017,11:37 AM

## 2017-05-13 NOTE — Progress Notes (Signed)
CARDIAC REHAB PHASE I   PRE:  Rate/Rhythm: 74 SR  BP:  Sitting: 111/72        SaO2: 98 RA  MODE:  Ambulation: 270 ft   POST:  Rate/Rhythm: 113 ST  BP:  Sitting: 129/70         SaO2: 94 RA  Pt ambulated 270 ft on RA, rolling walker, standby assist x2, mostly steady gait (did require verbal cues for safe use of RW), tolerated well with no complaints. VSS. Pt to recliner after walk, call bell within reach. Can be x1.   [phone] Lenna Sciara, RN, BSN 05/13/2017 10:20 AM

## 2017-05-13 NOTE — Progress Notes (Signed)
Patient ID: Patricia Wagner, female   DOB: [birth year], 66 y.o.   MRN: [id number]     Primary Cardiologist: Dr. [name]  Subjective:    Admitted 04/29/17 with acute respiratory distress in the setting of atrial fibrillation and volume overload. Hypotensive, started on dobutamine, Amio gtt and IV diuresis.   Transitioned to po Amio 05/03/17, DCCV with return to NSR on 05/06/17, unfortunately went back into Afib on 05/08/17 (IV Amio restarted) thus developed recurrent shock and volume overload.  Dobutamine 2.70mcg restarted 05/09/17. DCCV yesterday, remains in NSR today.   Weight down 41 pounds total. Co ox 67% on 2.66mcg Dobutamine. CVP 10  Feels well today, denies SOB and orthopnea.    Objective:   Weight Range: 247 lb 12.8 oz (112.4 kg) Body mass index is 41.24 kg/m.   Vital Signs:   Temp:  [97.8 F (36.6 C)-98.5 F (36.9 C)] 98.5 F (36.9 C) (06/20 0700) Pulse Rate:  [58-96] 68 (06/20 0700) Resp:  [8-24] 20 (06/20 0700) BP: (102-161)/(61-83) 133/76 (06/20 0700) SpO2:  [83 %-100 %] 96 % (06/20 0700) Weight:  [247 lb 12.8 oz (112.4 kg)] 247 lb 12.8 oz (112.4 kg) (06/20 0500) Last BM Date: 05/11/17  Weight change: Filed Weights   05/11/17 0230 05/12/17 0316 05/13/17 0500  Weight: 251 lb 12.3 oz (114.2 kg) 251 lb 11.2 oz (114.2 kg) 247 lb 12.8 oz (112.4 kg)    Intake/Output:   Intake/Output Summary (Last 24 hours) at 05/13/17 0748 Last data filed at 05/13/17 0600  Gross per 24 hour  Intake            825.3 ml  Output             1025 ml  Net           -199.7 ml     Physical Exam: CVP 10  General: Female, NAD. Lying in bed.  HEENT: normal Neck: supple. JVD to jaw. Carotids 2+ bilat; no bruits. No thyromegaly or nodule noted. Cor: PMI nondisplaced. RRR, No M/G/R noted Lungs: CTAB, normal effort. Abdomen: soft, non-tender, non distended, no HSM. No bruits or masses. +BS  Extremities: no cyanosis, clubbing, rash, trace lower extremity edema.  Neuro: alert & orientedx3,  cranial nerves grossly intact. moves all 4 extremities w/o difficulty. Affect pleasant   Telemetry: NSR 60s Personally reviewed     Labs: CBC  Recent Labs  05/10/17 1720 05/13/17 0525  WBC 5.6 5.1  HGB 11.8* 12.0  HCT 39.7 41.2  MCV 91.3 91.2  PLT 139* 595   Basic Metabolic Panel  Recent Labs  05/12/17 0500 05/13/17 0525  NA 140 139  K 4.2 4.2  CL 96* 95*  CO2 35* 34*  GLUCOSE 89 91  BUN 16 14  CREATININE 1.49* 1.62*  CALCIUM 8.8* 9.0  MG 2.0  --   PHOS 3.3 3.3   Liver Function Tests  Recent Labs  05/12/17 0500 05/13/17 0525  ALBUMIN 2.8* 3.1*    BNP: BNP (last 3 results)  Recent Labs  10/31/16 1246 11/20/16 1121 04/29/17 0935  BNP 1,218.8* 1,555.6* 2,712.1*     Imaging/Studies: Transesophageal Echocardiography 03/25/17 Study Conclusions  - Left ventricle: Systolic function was severely reduced. The   estimated ejection fraction was 15%. Diffuse hypokinesis. - Aortic valve: No evidence of vegetation. - Mitral valve: No evidence of vegetation. There was mild to   moderate regurgitation. - Left atrium: The atrium was moderately dilated. No evidence of   thrombus in the atrial cavity or appendage. -  Right ventricle: The cavity size was mildly dilated. Systolic   function was severely reduced. - Right atrium: The atrium was mildly dilated. - Atrial septum: No defect or patent foramen ovale was identified. - Tricuspid valve: No evidence of vegetation. There was moderate   regurgitation. - Pulmonic valve: No evidence of vegetation. - Pericardium, extracardiac: A trivial pericardial effusion was   identified.  Impressions:  - Severe LV dysfunction; mild to moderate MR; moderate LAE; no LAA   thrombus; mild RAE and RVE; severely reduced RV function;   moderate TR.   Medications:     Scheduled Medications: . furosemide  80 mg Intravenous BID  . isosorbide-hydrALAZINE  1 tablet Oral TID  . potassium chloride  40 mEq Oral BID  .  ranolazine  500 mg Oral BID  . rosuvastatin  10 mg Oral Daily  . sodium chloride flush  10-40 mL Intracatheter Q12H  . sodium chloride flush  3 mL Intravenous Q12H  . Warfarin - Pharmacist Dosing Inpatient   Does not apply q1800    Infusions: . sodium chloride Stopped (05/12/17 1258)  . amiodarone 30 mg/hr (05/13/17 0343)  . DOBUTamine 2.5 mcg/kg/min (05/11/17 1700)    PRN Medications: acetaminophen, loperamide, ondansetron (ZOFRAN) IV, sodium chloride flush, sodium chloride flush   Assessment/Plan   1. Acute on chronic biventricular CHF: TEE 03/25/17 with LVEF 15% and severely reduced RV function.  - Developed recurrent shock in the setting of Afib RVR, now in NSR on dobutamine 2.5 mcg. Co ox 68% this am. Can wean dobutamine to 47mcg today. - NYHA III - Volume much improved, give one dose of IV lasix today and start po tomorrow.  - Start Arlyce Harman 12.5mg  daily - Will reduce KCl supplement with addition of Spiro.  - No beta blocker yet.  - Increase Bidil to 2 tabs TID - Will consider losartan outpatient.  2. Persistent Afib: Cannot tolerate AF with recurrent shock. S/p DCCV on 5/2 (she stopped Amio after this) and DCCV on 05/06/17.  - S/p DCCV 05/12/17.  - Remains in NSR today. - Continue Ranexa 500 mg BID.  - Continue warfarin for anticoagulation.  - This patients CHA2DS2-VASc Score and unadjusted Ischemic Stroke Rate (% per year) is equal to 4.8 % stroke rate/year from a score of 4 Above score calculated as 1 point each if present [CHF, HTN, DM, Vascular=MI/PAD/Aortic Plaque, Age if 65-74, or Female], 2 points each if present [Age > 75, or Stroke/TIA/TE] 3. Acute hypercapnic respiratory failure: Suspect OSA at baseline.  Continue CPAP with sleeping.  - Sleep study order completed.  4. 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   - Baseline creatinine 1.6 - Stable 1.62 today.  5. COPD: - No wheezing noted.  - No changes.  6. PT recommend SNF.   - She wants  to go home and go back to work so she doesn't lose her job - She has walked with cardiac rehab, told her to sit up in recliner today and will need to walk at least 2 times today.    Arbutus Leas, NP 05/13/2017   Advanced Heart Failure Team Pager [phone] (M-F; [place])  Please contact [facility] for night-coverage after hours (4p -7a ) and weekends on [email or web address]  Patient seen and examined with Jettie Booze, NP. We discussed all aspects of the encounter. I agree with the assessment and plan as stated above.   Agree with above. Very complicated patient. Remains in NSR on IV amio after repeat DC-CV  6/19. Will continue IV amio and Ranexa.  Co-ox improved. Will wean dobutamine to 1. Volume status improving can switch to po lasix  Will need to remain in hospital on IV amio at least another 24-48 hours to make sure she remains in NSR - decompensates very quickly with AF. Will touch base with EP again .   Continue warfarin. INR ok. D/w Verner Mould, MD  9:41 AM

## 2017-05-14 LAB — RENAL FUNCTION PANEL
ANION GAP: 8 (ref 5–15)
Albumin: 3 g/dL — ABNORMAL LOW (ref 3.5–5.0)
BUN: 13 mg/dL (ref 6–20)
CALCIUM: 8.9 mg/dL (ref 8.9–10.3)
CHLORIDE: 95 mmol/L — AB (ref 101–111)
CO2: 33 mmol/L — AB (ref 22–32)
Creatinine, Ser: 1.71 mg/dL — ABNORMAL HIGH (ref 0.44–1.00)
GFR calc non Af Amer: 31 mL/min — ABNORMAL LOW (ref >60)
GFR, EST AFRICAN AMERICAN: 36 mL/min — AB (ref >60)
GLUCOSE: 86 mg/dL (ref 65–99)
POTASSIUM: 4.1 mmol/L (ref 3.5–5.1)
Phosphorus: 3.3 mg/dL (ref 2.5–4.6)
SODIUM: 136 mmol/L (ref 135–145)

## 2017-05-14 LAB — COOXEMETRY PANEL
CARBOXYHEMOGLOBIN: 1.3 % (ref 0.5–1.5)
Carboxyhemoglobin: 1.9 % — ABNORMAL HIGH (ref 0.5–1.5)
METHEMOGLOBIN: 1 % (ref 0.0–1.5)
Methemoglobin: 0.8 % (ref 0.0–1.5)
O2 SAT: 58.4 %
O2 Saturation: 73.8 %
TOTAL HEMOGLOBIN: 12.5 g/dL (ref 12.0–16.0)
TOTAL HEMOGLOBIN: 13.1 g/dL (ref 12.0–16.0)

## 2017-05-14 LAB — PROTIME-INR
INR: 2.71
Prothrombin Time: 29.3 seconds — ABNORMAL HIGH (ref 11.4–15.2)

## 2017-05-14 MED ORDER — WARFARIN SODIUM 2 MG PO TABS
3.0000 mg | ORAL_TABLET | Freq: Once | ORAL | Status: AC
  Administered 2017-05-14: 3 mg via ORAL
  Filled 2017-05-14: qty 1.5

## 2017-05-14 MED ORDER — FUROSEMIDE 10 MG/ML IJ SOLN
40.0000 mg | Freq: Once | INTRAMUSCULAR | Status: AC
  Administered 2017-05-14: 40 mg via INTRAVENOUS
  Filled 2017-05-14: qty 4

## 2017-05-14 MED ORDER — FUROSEMIDE 40 MG PO TABS
40.0000 mg | ORAL_TABLET | Freq: Two times a day (BID) | ORAL | Status: DC
  Administered 2017-05-14 – 2017-05-16 (×4): 40 mg via ORAL
  Filled 2017-05-14 (×4): qty 1

## 2017-05-14 MED ORDER — MUPIROCIN 2 % EX OINT
TOPICAL_OINTMENT | CUTANEOUS | Status: AC
  Filled 2017-05-14: qty 22

## 2017-05-14 NOTE — Progress Notes (Signed)
Patient ID: Patricia Wagner, female   DOB: 1950/12/22, 66 y.o.   MRN: [id number]     Primary Cardiologist: Dr. [name]  Subjective:    Admitted 04/29/17 with acute respiratory distress in the setting of atrial fibrillation and volume overload. Hypotensive, started on dobutamine, Amio gtt and IV diuresis.   Transitioned to po Amio 05/03/17, DCCV with return to NSR on 05/06/17, unfortunately went back into Afib on 05/08/17 (IV Amio restarted) thus developed recurrent shock and volume overload. Dobutamine restarted.  Repeat DCCV on 05/12/17.   Remains in NSR today, dobutamine weaned to 16mcg yesterday. Weight down 42 pounds total. Co ox 58%. CVP 9. Feels well. Denies palpitations, SOB and chest pain.   Objective:   Weight Range: 246 lb 14.6 oz (112 kg) Body mass index is 41.09 kg/m.   Vital Signs:   Temp:  [98.2 F (36.8 C)-98.5 F (36.9 C)] 98.4 F (36.9 C) (06/21 0353) Pulse Rate:  [66-89] 79 (06/20 2359) Resp:  [14-24] 18 (06/21 0353) BP: (91-125)/(56-81) 97/61 (06/21 0353) SpO2:  [90 %-98 %] 94 % (06/20 2359) Weight:  [246 lb 14.6 oz (112 kg)] 246 lb 14.6 oz (112 kg) (06/21 0523) Last BM Date: 05/13/17  Weight change: Filed Weights   05/12/17 0316 05/13/17 0500 05/14/17 0523  Weight: 251 lb 11.2 oz (114.2 kg) 247 lb 12.8 oz (112.4 kg) 246 lb 14.6 oz (112 kg)    Intake/Output:   Intake/Output Summary (Last 24 hours) at 05/14/17 0806 Last data filed at 05/14/17 0600  Gross per 24 hour  Intake            826.4 ml  Output             1650 ml  Net           -823.6 ml     Physical Exam: CVP 9 General: Female, NAD. Lying in bed.  HEENT: Normal Neck: Supple. JVP 5-6 cm. Carotids 2+ bilat; no bruits. No thyromegaly or nodule noted. Cor: PMI nondisplaced. Regular rate and rhythm. No M/G/R noted Lungs: CTAB, normal effort.  Abdomen: Soft, non-tender, non-distended, no HSM. No bruits or masses. +BS  Extremities: No cyanosis, clubbing, rash, R and LLE no edema.  Neuro: Alert &  orientedx3, cranial nerves grossly intact. moves all 4 extremities w/o difficulty. Affect pleasant    Telemetry: NSR      Labs: CBC  Recent Labs  05/13/17 0525  WBC 5.1  HGB 12.0  HCT 41.2  MCV 91.2  PLT 330   Basic Metabolic Panel  Recent Labs  05/12/17 0500 05/13/17 0525 05/14/17 0523  NA 140 139 136  K 4.2 4.2 4.1  CL 96* 95* 95*  CO2 35* 34* 33*  GLUCOSE 89 91 86  BUN 16 14 13   CREATININE 1.49* 1.62* 1.71*  CALCIUM 8.8* 9.0 8.9  MG 2.0  --   --   PHOS 3.3 3.3 3.3   Liver Function Tests  Recent Labs  05/13/17 0525 05/14/17 0523  ALBUMIN 3.1* 3.0*    BNP: BNP (last 3 results)  Recent Labs  10/31/16 1246 11/20/16 1121 04/29/17 0935  BNP 1,218.8* 1,555.6* 2,712.1*     Imaging/Studies: Transesophageal Echocardiography 03/25/17 Study Conclusions  - Left ventricle: Systolic function was severely reduced. The   estimated ejection fraction was 15%. Diffuse hypokinesis. - Aortic valve: No evidence of vegetation. - Mitral valve: No evidence of vegetation. There was mild to   moderate regurgitation. - Left atrium: The atrium was moderately dilated. No evidence  of   thrombus in the atrial cavity or appendage. - Right ventricle: The cavity size was mildly dilated. Systolic   function was severely reduced. - Right atrium: The atrium was mildly dilated. - Atrial septum: No defect or patent foramen ovale was identified. - Tricuspid valve: No evidence of vegetation. There was moderate   regurgitation. - Pulmonic valve: No evidence of vegetation. - Pericardium, extracardiac: A trivial pericardial effusion was   identified.  Impressions:  - Severe LV dysfunction; mild to moderate MR; moderate LAE; no LAA   thrombus; mild RAE and RVE; severely reduced RV function;   moderate TR.   Medications:     Scheduled Medications: . furosemide  80 mg Intravenous Once  . furosemide  60 mg Oral BID  . isosorbide-hydrALAZINE  2 tablet Oral TID  .  potassium chloride  40 mEq Oral Daily  . ranolazine  500 mg Oral BID  . rosuvastatin  10 mg Oral Daily  . sodium chloride flush  10-40 mL Intracatheter Q12H  . sodium chloride flush  3 mL Intravenous Q12H  . spironolactone  12.5 mg Oral Daily  . Warfarin - Pharmacist Dosing Inpatient   Does not apply q1800    Infusions: . sodium chloride Stopped (05/12/17 1258)  . amiodarone 30 mg/hr (05/14/17 0521)  . DOBUTamine 1 mcg/kg/min (05/13/17 1600)    PRN Medications: acetaminophen, loperamide, ondansetron (ZOFRAN) IV, sodium chloride flush, sodium chloride flush   Assessment/Plan   1. Acute on chronic biventricular CHF: TEE 03/25/17 with LVEF 15% and severely reduced RV function.  - Developed recurrent shock in the setting of Afib RVR. - Co ox 58 %. Can turn off dobutamine.  - NYHA III.  - Volume status stable. Start po lasix 40 mg BID today.  - Continue Spiro 12.5  - Continue to hold addition of beta blocker.   - Continue  Bidil to 2 tabs TID - Will consider losartan outpatient.  2. Persistent Afib: Cannot tolerate AF with recurrent shock. S/p DCCV on 5/2 (she stopped Amio after this) and DCCV on 05/06/17.  - S/p DCCV 05/12/17.  - Remains in NSR.  - Continue Ranexa 500 mg BID.  - Continue warfarin for anticoagulation.  - This patients CHA2DS2-VASc Score and unadjusted Ischemic Stroke Rate (% per year) is equal to 4.8 % stroke rate/year from a score of 4 Above score calculated as 1 point each if present [CHF, HTN, DM, Vascular=MI/PAD/Aortic Plaque, Age if 65-74, or Female], 2 points each if present [Age > 75, or Stroke/TIA/TE] - MD to advise regarding transition to po Amio today.  3. Acute hypercapnic respiratory failure: Suspect OSA at baseline.  Continue CPAP with sleeping.  - Sleep study order completed.  - No change to current plan.  4. 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   - Baseline creatinine 1.6 - Creatinine 1.71 today.  5. COPD: - No wheezing  noted.  - No change to current plan.  6. PT recommend SNF.   - pt. Wants to go home with home health services.    Arbutus Leas, NP 05/14/2017   Advanced Heart Failure Team Pager [phone] (M-F; [place])  Please contact [facility] for night-coverage after hours (4p -7a ) and weekends on [email or web address]  Patient seen and examined with Jettie Booze, NP. We discussed all aspects of the encounter. I agree with the assessment and plan as stated above.   Remains on dobutamine. Co-ox borderline but ok. Will d/c dobutamine today. Remains in NSR. Continue  IV amio.   If co-ox stable and remains in NSR can consider d/c home in am with very close f/u. She deteriorates quickly with AF. Will need f/u with EP as outpatient to consider AF ablation.   Ok to switch to po lasix.   Glori Bickers, MD  1:14 PM

## 2017-05-14 NOTE — Progress Notes (Signed)
ANTICOAGULATION CONSULT NOTE - Follow Up Consult  Pharmacy Consult for Coumadin Indication: atrial fibrillation  Allergies  Allergen Reactions  . Ace Inhibitors Palpitations    Patient Measurements: Height: 5\' 5"  (165.1 cm) Weight: 246 lb 14.6 oz (112 kg) IBW/kg (Calculated) : 57  Vital Signs: Temp: 98.1 F (36.7 C) (06/21 1140) Temp Source: Oral (06/21 1140) BP: 129/79 (06/21 1200) Pulse Rate: 77 (06/21 1140)  Labs:  Recent Labs  05/12/17 0500 05/13/17 0525 05/14/17 0523  HGB  --  12.0  --   HCT  --  41.2  --   PLT  --  155  --   LABPROT 36.8* 34.7* 29.3*  INR 3.60 3.34 2.71  CREATININE 1.49* 1.62* 1.71*    Estimated Creatinine Clearance: 42 mL/min (A) (by C-G formula based on SCr of 1.71 mg/dL (H)).  Assessment: 65yof continues on coumadin for afib, s/p DCCV on 6/13 and again 6/19. Maintaining NSR now on IV amiodarone. INR is therapeutic at 2.71 after holding yesterday's dose. No bleeding.  Home dose: 3mg  daily  Goal of Therapy:  INR 2-3 Monitor platelets by anticoagulation protocol: Yes   Plan:  1) Coumadin 3mg  tonight 2) Daily INR  Deboraha Sprang 05/14/2017,2:46 PM

## 2017-05-14 NOTE — Care Management Note (Addendum)
Case Management Note  Patient Details  Name: Patricia Wagner MRN: [id number] Date of Birth: 08/06/1951  Subjective/Objective:   Pt admitted for SOB, hypotensive, AKI  Action/Plan:   PTA from home - pt is on BIPAP >24 hours.  Pt was independent from home per daughter.  Possible discharge with inotropes - starting dobutamine.  [facility] notified of possible home IV infusion referral.  PT eval will be ordered when pt is medically stable.   Expected Discharge Date:  05/04/17               Expected Discharge Plan:  [place]  In-House Referral:     Discharge planning Services     Post Acute Care Choice:    Choice offered to:     DME Arranged:    DME Agency:     HH Arranged:    HH Agency:     Status of Service:     If discussed at [employer], dates discussed:    Additional Comments: 05/14/2017  Daughter confirmed that between herself, daughter and pts sister in law that recommended supervision will be provided.  CM discussed recommendation for 24 hour supervision in depth with pt - pt states her daughter can stay with her over the weekend and her sister in law can provide some 24 hour supervision - at the request of the pt - CM left voicemail for daughter to assist with  24 hour coverage plan.   CM also explained in detail the telecare program provided by [facility].  05/13/17 [facility] will not be able to initiate services in the home until 6/25; agency will contact pt via phone and enroll pt is telecare program that will closely monitor pt daily if discharged over the weekend.  HF team is in agreement with this plan and will also closely monitor pt via HF protocol.  05/12/17 CM contacted [employer] to request expedited [facility] authorization - CM confirmed with HF PA that the currently plan is to attempt to wean pt of inotrope's prior to discharge.  CM was able to gain authorization for [facility] HF orders (HF RN and other modalities as determined necessary per RN initial visit)  through [place] for coverage starting 05/15/17 for approximately 21 days - attending will be able to request more visits if needed.  CM faxed H&P, HHHF (including [place] for initial assessment)  order and demographics order to [phone] today.  [employer] auth Intake ID# [id number] and authorization number [id number] - with services beginning the day of or day after discharge -  Pt offered choice for HHRN/HHHF  and pt chose [facility] - agency contacted and referral accepted for HHRN/HHHF orders.  CM instructed by [employer] to contact [facility] on day of discharge to inform of discharge.  CM again requested attending service to complete sleep study form on shadow chart so study can be arranged.  CM also requested ambulatory note for home oxygen if needed - choice offered pt chose [facility] however no order written yet, agency accepted referral  Successful Cardioversion performed today - pt is now in NSR  Pt discussed in LOS 6/19 - remains appropriate for continued stay.  Pt remains on dobutamine drip - planned redo on cardioversion today.  Pt is now refusing SNF and wants to go home with [facility].  CM will continue to follow for discharge needs.  [facility] following for possible IV inotrope needs post discharge.  CM contacted [employer] CM and informed of pending discharge  home with HH/infusion needs once pt is NSR for 48 hours  05/11/17 Unfortunately pt reverted back into AFib and developed shock - both dobutamine and amiodarone had to be restarted.  Pt is also on IV lasix - scheduled for another Cardioversion 6/19.  CM will continue to follow for discharge needs (SNF vs Home with possible inotropes).  05/08/17 Pt is now s/p cardioversion, dobutamine is off.  However, pt is now in AFIB and IV lasix restarted.  Pt is now refusing PT eval and stating she wants to discharge home.  CM spoke with [place] Luisa Hart [phone] ext [phone] - request call back if pt is to discharge home and needs [facility] - so she can expedite [facility]  approval (if infusion is not needed).  CM contacted HF team and requested additonal assessment with pt at bedside as pt is now refusing PT, walking and SNF.  HF NP agrees that pt would be safely discharged to SNF - will discuss directly with pt   05/05/17 Pt will have Cardioversion tomorrow - attending with then attempt to wean her off the dobutamine - if this is not successful pt will need to discharge with inotrope - attending aware that current recommendation is SNF.  CSW aware that pt may discharge to SNF with inotropic drug.  05/01/17 SNF recommended - CSW consulted  CM contacted [facility] and verified [facility] is in [place] verified both HH and IV infusion orders can be submitted via [facility] directly.  CM will continue to follow for discharge needs.    Pt started on inotrope and IV amio yesterday.  CM contacted [facility] and informed of tentative referral for IV infusion and RN.  Per [facility] - agency will seek insurance auth for both IV infusion and HHRN.   As a back up plan ; CM requested HHRN order from PA - once order is received CM will verify process with [employer].  Sleep study order form placed on shadow chart - PA aware [name], RN 05/14/2017, 10:39 AM

## 2017-05-14 NOTE — Progress Notes (Signed)
CARDIAC REHAB PHASE I   PRE:  Rate/Rhythm: 74 SR    BP: sitting 122/73    SaO2: 87 RA after donning socks, 91 RA with rest and PLB  MODE:  Ambulation: 280 ft   POST:  Rate/Rhythm: 95 SR    BP: sitting 129/79     SaO2: 89-90 RA  Pt donned socks independently while sitting. SaO2 low on RA afterward. Increased with rest and pursed lip breathing. Used RW, independent walking (supervision). Needed reminders not to get feet outside RW. Rest x1 to determine Sao2. Hard to register but then 95 RA after 1-2 min. Pt increased distance. SAo2 did register quickly after walking and 89-90 RA.  Will f/u.  [place], ACSM 05/14/2017 12:06 PM

## 2017-05-14 NOTE — Progress Notes (Signed)
[facility]  [facility] will follow Ms. Redditt during this hospitalization to support needed [facility] services upon DC to home. [facility] will provide [facility] and other disciplines as ordered and place on [facility] Heart Failure Management program at home.  If patient discharges after hours, please call [phone].   Larry Sierras 05/14/2017, 7:22 AM

## 2017-05-15 LAB — COOXEMETRY PANEL
CARBOXYHEMOGLOBIN: 1.3 % (ref 0.5–1.5)
METHEMOGLOBIN: 1.1 % (ref 0.0–1.5)
O2 Saturation: 78.3 %
Total hemoglobin: 11.9 g/dL — ABNORMAL LOW (ref 12.0–16.0)

## 2017-05-15 LAB — RENAL FUNCTION PANEL
Albumin: 3.1 g/dL — ABNORMAL LOW (ref 3.5–5.0)
Anion gap: 6 (ref 5–15)
BUN: 13 mg/dL (ref 6–20)
CALCIUM: 8.9 mg/dL (ref 8.9–10.3)
CHLORIDE: 97 mmol/L — AB (ref 101–111)
CO2: 36 mmol/L — AB (ref 22–32)
CREATININE: 1.64 mg/dL — AB (ref 0.44–1.00)
GFR, EST AFRICAN AMERICAN: 37 mL/min — AB (ref >60)
GFR, EST NON AFRICAN AMERICAN: 32 mL/min — AB (ref >60)
Glucose, Bld: 88 mg/dL (ref 65–99)
Phosphorus: 3.8 mg/dL (ref 2.5–4.6)
Potassium: 3.9 mmol/L (ref 3.5–5.1)
SODIUM: 139 mmol/L (ref 135–145)

## 2017-05-15 LAB — PROTIME-INR
INR: 2.88
Prothrombin Time: 30.8 seconds — ABNORMAL HIGH (ref 11.4–15.2)

## 2017-05-15 MED ORDER — SPIRONOLACTONE 25 MG PO TABS
25.0000 mg | ORAL_TABLET | Freq: Every day | ORAL | Status: DC
  Administered 2017-05-15 – 2017-05-16 (×2): 25 mg via ORAL
  Filled 2017-05-15 (×2): qty 1

## 2017-05-15 MED ORDER — LOSARTAN POTASSIUM 25 MG PO TABS
12.5000 mg | ORAL_TABLET | Freq: Every day | ORAL | Status: DC
  Administered 2017-05-15 – 2017-05-16 (×2): 12.5 mg via ORAL
  Filled 2017-05-15 (×2): qty 1

## 2017-05-15 MED ORDER — AMIODARONE HCL 200 MG PO TABS
400.0000 mg | ORAL_TABLET | Freq: Two times a day (BID) | ORAL | Status: DC
  Administered 2017-05-15 – 2017-05-16 (×3): 400 mg via ORAL
  Filled 2017-05-15 (×3): qty 2

## 2017-05-15 MED ORDER — WARFARIN SODIUM 2 MG PO TABS
3.0000 mg | ORAL_TABLET | Freq: Once | ORAL | Status: AC
  Administered 2017-05-16: 3 mg via ORAL
  Filled 2017-05-15: qty 1.5

## 2017-05-15 NOTE — Progress Notes (Signed)
ANTICOAGULATION CONSULT NOTE - Follow Up Consult  Pharmacy Consult for Coumadin Indication: atrial fibrillation  Allergies  Allergen Reactions  . Ace Inhibitors Palpitations    Patient Measurements: Height: 5\' 5"  (165.1 cm) Weight: 246 lb 4.1 oz (111.7 kg) IBW/kg (Calculated) : 57  Vital Signs: Temp: 98.3 F (36.8 C) (06/22 1159) Temp Source: Oral (06/22 1159) BP: 115/77 (06/22 1159) Pulse Rate: 75 (06/22 1159)  Labs:  Recent Labs  05/13/17 0525 05/14/17 0523 05/15/17 0415  HGB 12.0  --   --   HCT 41.2  --   --   PLT 155  --   --   LABPROT 34.7* 29.3* 30.8*  INR 3.34 2.71 2.88  CREATININE 1.62* 1.71* 1.64*    Estimated Creatinine Clearance: 43.7 mL/min (A) (by C-G formula based on SCr of 1.64 mg/dL (H)).  Assessment: 65yof continues on coumadin for afib, s/p DCCV on 6/13 and again 6/19. Maintaining NSR, amiodarone switched to PO today. INR is therapeutic at 2.88. No bleeding.  Home dose: 3mg  daily  Goal of Therapy:  INR 2-3 Monitor platelets by anticoagulation protocol: Yes   Plan:  1) Coumadin 3mg  tonight 2) Daily INR  Deboraha Sprang 05/15/2017,12:36 PM

## 2017-05-15 NOTE — Progress Notes (Signed)
Patient's O2 sats dropped to 85% while sleeping, 02 applied at 1L via Nasal cannula and sats maintained above 90%. Patient may need an order for oxygen upon discharge.

## 2017-05-15 NOTE — Progress Notes (Signed)
PT Cancellation Note  Patient Details Name: Patricia Wagner MRN: [id number] DOB: May 15, 1951   Cancelled Treatment:    Reason Eval/Treat Not Completed: At the time of PT attempt, pt on the phone and did not want to be disturbed. Will continue to follow and progress as able per POC.    [name] 05/15/2017, 2:17 PM   Rolinda Roan, PT, DPT Acute Rehabilitation Services Pager: [phone]

## 2017-05-15 NOTE — Progress Notes (Signed)
Patient ID: Patricia Wagner, female   DOB: 1950/12/19, 66 y.o.   MRN: [id number]     Primary Cardiologist: Dr. [name]  Subjective:    Admitted 04/29/17 with acute respiratory distress in the setting of atrial fibrillation and volume overload. Hypotensive, started on dobutamine, Amio gtt and IV diuresis.   Transitioned to po Amio 05/03/17, DCCV with return to NSR on 05/06/17, unfortunately went back into Afib on 05/08/17 (IV Amio restarted) thus developed recurrent shock and volume overload. Dobutamine restarted.  Repeat DCCV on 05/12/17. Dobutamine stopped yesterday. Remains in NSR. Co ox 78%.   Weight stable, down 42 pounds total. CVP 9 . Feels well, denies chest pain, palpitations and orthopnea.   Objective:   Weight Range: 246 lb 4.1 oz (111.7 kg) Body mass index is 40.98 kg/m.   Vital Signs:   Temp:  [97.1 F (36.2 C)-98.7 F (37.1 C)] 98.1 F (36.7 C) (06/22 0609) Pulse Rate:  [62-81] 62 (06/22 0609) Resp:  [16-28] 16 (06/22 0609) BP: (95-133)/(62-81) 125/72 (06/22 0609) SpO2:  [91 %-97 %] 95 % (06/22 0609) Weight:  [246 lb 4.1 oz (111.7 kg)] 246 lb 4.1 oz (111.7 kg) (06/22 0606) Last BM Date: 05/14/17  Weight change: Filed Weights   05/13/17 0500 05/14/17 0523 05/15/17 0606  Weight: 247 lb 12.8 oz (112.4 kg) 246 lb 14.6 oz (112 kg) 246 lb 4.1 oz (111.7 kg)    Intake/Output:   Intake/Output Summary (Last 24 hours) at 05/15/17 0646 Last data filed at 05/15/17 0400  Gross per 24 hour  Intake              791 ml  Output             1120 ml  Net             -329 ml     Physical Exam: CVP 9 General: Elderly appearing female. NAD. Lying in bed.  HEENT: Normal Neck: Supple. JVP 7-8 cm. Carotids 2+ bilat; no bruits. No thyromegaly or nodule noted. Cor: PMI nondisplaced. RRR, No M/G/R noted Lungs: CTAB, normal effort. Abdomen: Soft, non-tender, non-distended, no HSM. No bruits or masses. +BS  Extremities: No cyanosis, clubbing, rash, R and LLE no edema.  Neuro: Alert &  orientedx3, cranial nerves grossly intact. moves all 4 extremities w/o difficulty. Affect pleasant     Telemetry: NSR      Labs: CBC  Recent Labs  05/13/17 0525  WBC 5.1  HGB 12.0  HCT 41.2  MCV 91.2  PLT 631   Basic Metabolic Panel  Recent Labs  05/14/17 0523 05/15/17 0415  NA 136 139  K 4.1 3.9  CL 95* 97*  CO2 33* 36*  GLUCOSE 86 88  BUN 13 13  CREATININE 1.71* 1.64*  CALCIUM 8.9 8.9  PHOS 3.3 3.8   Liver Function Tests  Recent Labs  05/14/17 0523 05/15/17 0415  ALBUMIN 3.0* 3.1*    BNP: BNP (last 3 results)  Recent Labs  10/31/16 1246 11/20/16 1121 04/29/17 0935  BNP 1,218.8* 1,555.6* 2,712.1*     Imaging/Studies: Transesophageal Echocardiography 03/25/17 Study Conclusions  - Left ventricle: Systolic function was severely reduced. The   estimated ejection fraction was 15%. Diffuse hypokinesis. - Aortic valve: No evidence of vegetation. - Mitral valve: No evidence of vegetation. There was mild to   moderate regurgitation. - Left atrium: The atrium was moderately dilated. No evidence of   thrombus in the atrial cavity or appendage. - Right ventricle: The cavity size was mildly  dilated. Systolic   function was severely reduced. - Right atrium: The atrium was mildly dilated. - Atrial septum: No defect or patent foramen ovale was identified. - Tricuspid valve: No evidence of vegetation. There was moderate   regurgitation. - Pulmonic valve: No evidence of vegetation. - Pericardium, extracardiac: A trivial pericardial effusion was   identified.  Impressions:  - Severe LV dysfunction; mild to moderate MR; moderate LAE; no LAA   thrombus; mild RAE and RVE; severely reduced RV function;   moderate TR.   Medications:     Scheduled Medications: . furosemide  80 mg Intravenous Once  . furosemide  40 mg Oral BID  . isosorbide-hydrALAZINE  2 tablet Oral TID  . potassium chloride  40 mEq Oral Daily  . ranolazine  500 mg Oral BID  .  rosuvastatin  10 mg Oral Daily  . sodium chloride flush  10-40 mL Intracatheter Q12H  . spironolactone  12.5 mg Oral Daily  . Warfarin - Pharmacist Dosing Inpatient   Does not apply q1800    Infusions: . amiodarone 30 mg/hr (05/14/17 1824)    PRN Medications: acetaminophen, loperamide, ondansetron (ZOFRAN) IV, sodium chloride flush   Assessment/Plan   1. Acute on chronic biventricular CHF: TEE 03/25/17 with LVEF 15% and severely reduced RV function.  - Developed recurrent shock in the setting of Afib RVR, no s/p DCCV x 2. Remains in NSR - NYHA III - Co ox stable off dobutamine.  - Volume status stable, continue po Lasix 40mg  BID today.  - Increase Spiro to 25mg  BID.  - Hold beta blocker for now.   - Continue Bidil to 2 tabs TID - MD to advise regarding addition of losartan. Creatinine 1.56->1.49->1.62->1.71->1.64. GFR 37 2. Persistent Afib: Cannot tolerate AF with recurrent shock. S/p DCCV on 5/2 (she stopped Amio after this) and DCCV on 05/06/17.  - S/p DCCV 05/12/17.  - Remains in NSR today.  - Continue Ranexa 500 mg BID.  - She can transition to po Amio today (now 3 days out from DCCV on IV Amio). Will start 400mg  BID. She decompensates quickly if she develops atrial fibrillation.  - Continue warfarin for anticoagulation. INR 2.88 - This patients CHA2DS2-VASc Score and unadjusted Ischemic Stroke Rate (% per year) is equal to 4.8 % stroke rate/year from a score of 4 Above score calculated as 1 point each if present [CHF, HTN, DM, Vascular=MI/PAD/Aortic Plaque, Age if 65-74, or Female], 2 points each if present [Age > 75, or Stroke/TIA/TE] 3. Acute hypercapnic respiratory failure: Suspect OSA at baseline.  Continue CPAP with sleeping.  - Sleep study order completed.  - Will have close follow up in our clinic.  4. 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   - Baseline creatinine 1.6 - Creatinine 1.64 today at baseline.  5. COPD: - No wheezing noted.  - No  change to current plan.  6. PT recommend SNF.   - pt. Wants to go home with home health services.   Follow up made in CHF clinic for next week.   Arbutus Leas, NP 05/15/2017   Advanced Heart Failure Team Pager [phone] (M-F; [place])  Please contact [facility] for night-coverage after hours (4p -7a ) and weekends on [email or web address]  Patient seen with NP, agree with the above note.  She is in NSR today.  She is doing well, no dyspnea.   - Continue po Lasix.  - Can transition amiodarone to po.   - Start losartan 12.5 mg daily.  If she remains in NSR tomorrow and is feeling well, I think she can go home.   Loralie Champagne 05/15/2017

## 2017-05-15 NOTE — Progress Notes (Signed)
SATURATION QUALIFICATIONS: (This note is used to comply with regulatory documentation for home oxygen)  Patient Saturations on Room Air at Rest = 94%  Patient Saturations on Room Air while Ambulating = 90%    

## 2017-05-15 NOTE — Progress Notes (Signed)
Occupational Therapy Treatment Patient Details Name: Patricia Wagner MRN: [id number] DOB: 1951/02/26 Today's Date: 05/15/2017    History of present illness 66 y.o. female admitted with respiratory failure and heart failure. Successful cardioversion. PMHx: CHF, AFib, CKD, chronic cor pulmonale, CAD, ICM, obestity, CVA   OT comments  Pt demonstrates progress toward OT goals. She was able to demonstrate improved ability to complete toilet transfers with and without her cane this date requiring supervision for both. She does demonstrate decreased stability during dynamic standing ADL tasks without cane and recommended that pt utilize cane during ADL and IADL in her home while recovering once she returns home. Educated pt concerning safe tub transfers with cane this session. She continues to demonstrate impulsivity throughout sessions which impacted her safety and processing of education topics this session. Additionally educated pt on energy conservation strategies with handout provided and she verbalizes understanding. See below for vital signs during session. Will continue to follow while admitted.    Follow Up Recommendations  Supervision/Assistance - 24 hour;Home health OT    Equipment Recommendations  Other (comment) (Pt declining all DME for bathroom.)    Recommendations for Other Services      Precautions / Restrictions Precautions Precautions: Fall Precaution Comments: watch O2 sats Restrictions Weight Bearing Restrictions: No       Mobility Bed Mobility               General bed mobility comments: OOB in chair on arrival  Transfers Overall transfer level: Needs assistance Equipment used: Rolling walker (2 wheeled) Transfers: Sit to/from Stand Sit to Stand: Supervision         General transfer comment: Close supervision with VC's for safety and to limit impulsivity.     Balance Overall balance assessment: Needs assistance Sitting-balance support: Feet supported;No  upper extremity supported Sitting balance-Leahy Scale: Good     Standing balance support: During functional activity Standing balance-Leahy Scale: Fair Standing balance comment: Close supervision without cane.                            ADL either performed or assessed with clinical judgement   ADL Overall ADL's : Needs assistance/impaired                         Toilet Transfer: Supervision/safety;Ambulation Toilet Transfer Details (indicate cue type and reason): Pt initially utilizing cane but reports that she will not use cane in her home. Pt with decreased safety without cane and requiring close supervision.      Tub/ Shower Transfer: Min guard;Ambulation;Tub transfer   Functional mobility during ADLs: Supervision/safety;Cane (with and without cane; close supervision without) General ADL Comments: Pt educated on safe shower transfers with and without cane. Processing limited by impulsivity and this is decreasing her safety. Educated pt concerning energy conservation with handout provided and home modification for fall prevention. Recommended to pt that she utilize cane in her home as she recovers due to instability without UE support.      Vision   Vision Assessment?: No apparent visual deficits   Perception     Praxis      Cognition Arousal/Alertness: Awake/alert Behavior During Therapy: Impulsive Overall Cognitive Status: Impaired/Different from baseline Area of Impairment: Safety/judgement;Awareness;Problem solving;Attention                   Current Attention Level: Selective     Safety/Judgement: Decreased awareness of safety Awareness: Emergent Problem  Solving:  (Impulsivity limiting ability to process safely) General Comments: Pt impulsive throughout session with difficulty slowing down enough to process instructions and safely complete ADL.         Exercises     Shoulder Instructions       General Comments Poor SpO2  reading with desat to upper 80's during ADL.     Pertinent Vitals/ Pain       Pain Assessment: No/denies pain  Home Living                                          Prior Functioning/Environment              Frequency  Min 2X/week        Progress Toward Goals  OT Goals(current goals can now be found in the care plan section)  Progress towards OT goals: Progressing toward goals  Acute Rehab OT Goals Patient Stated Goal: home tomorrow OT Goal Formulation: With patient Time For Goal Achievement: 05/17/17 Potential to Achieve Goals: Good ADL Goals Pt Will Perform Grooming: with modified independence;standing Pt Will Perform Upper Body Dressing: with modified independence;standing Pt Will Perform Lower Body Dressing: with modified independence;sit to/from stand Pt Will Transfer to Toilet: with modified independence;ambulating (handicapped height toilet) Pt Will Perform Toileting - Clothing Manipulation and hygiene: with modified independence;sit to/from stand Pt Will Perform Tub/Shower Transfer: Tub transfer;tub bench;ambulating;rolling walker Pt/caregiver will Perform Home Exercise Program: Increased strength;Both right and left upper extremity;With written HEP provided;Independently Additional ADL Goal #1: Pt will independently incorporate 3 strategies to conserve energy during daily ADL routine.  Plan Discharge plan remains appropriate    Co-evaluation                 AM-PAC PT "6 Clicks" Daily Activity     Outcome Measure   Help from another person eating meals?: None Help from another person taking care of personal grooming?: A Little Help from another person toileting, which includes using toliet, bedpan, or urinal?: A Little Help from another person bathing (including washing, rinsing, drying)?: A Little Help from another person to put on and taking off regular upper body clothing?: None Help from another person to put on and taking off  regular lower body clothing?: A Little 6 Click Score: 20    End of Session Equipment Utilized During Treatment: Rolling walker  OT Visit Diagnosis: Unsteadiness on feet (R26.81);Muscle weakness (generalized) (M62.81)   Activity Tolerance Patient tolerated treatment well   Patient Left in chair;with call bell/phone within reach;with family/visitor present   Nurse Communication Mobility status        Time: 1514-1600 OT Time Calculation (min): 46 min  Charges: OT General Charges $OT Visit: 1 Procedure OT Treatments $Self Care/Home Management : 38-52 mins  Norman Herrlich, MS OTR/L  Pager: [place] A Shaquina Gillham 05/15/2017, 5:33 PM

## 2017-05-15 NOTE — Care Management Note (Addendum)
Case Management Note  Patient Details  Name: CHRISTLE NOLTING MRN: [id number] Date of Birth: [birth year]  Subjective/Objective:   Pt admitted for SOB, hypotensive, AKI  Action/Plan:   PTA from home - pt is on BIPAP >24 hours.  Pt was independent from home per daughter.  Possible discharge with inotropes - starting dobutamine.  [place] notified of possible home IV infusion referral.  PT eval will be ordered when pt is medically stable.   Expected Discharge Date:  05/04/17               Expected Discharge Plan:  [place]  In-House Referral:     Discharge planning Services  CM Consult  Post Acute Care Choice:  Durable Medical Equipment Choice offered to:  Patient  DME Arranged:  3-N-1, Walker rolling, Tub bench DME Agency:  [facility] Arranged:  RN, PT, Disease Management [place] Agency:  [facility]  Status of Service:  In process, will continue to follow  If discussed at Long Length of Stay Meetings, dates discussed:    Additional Comments: 05/15/2017  Pt refused 3:1 and tub bench.  Ambulatory test performed and pt does not qualify for insurance covered oxygen - pt refused to pay at cost for oxygen.   CM confirmed with [employer] [facility] that pt is approved to have [facility] RN and other [place] as determined necessary at discharge - agency informed that discharge will happen this weekend.  CM requested the following from the attending; DME orders, completion of sleep study form and ambulatory assessment for home oxygen.  Pt offered choice for DME - pt chose [facility] - agency contacted and referral accepted  05/14/17 Daughter confirmed that between herself, daughter and pts sister in law that recommended supervision will be provided.  CM discussed recommendation for 24 hour supervision in depth with pt - pt states her daughter can stay with her over the weekend and her sister in law can provide some 24 hour supervision - at the request of the pt - CM left voicemail for  daughter to assist with  24 hour coverage plan.   CM also explained in detail the telecare program provided by [facility].  05/13/17 [facility] will not be able to initiate services in the home until 6/25; agency will contact pt via phone and enroll pt is telecare program that will closely monitor pt daily if discharged over the weekend.  HF team is in agreement with this plan and will also closely monitor pt via HF protocol.  05/12/17 CM contacted [employer] to request expedited [facility] authorization - CM confirmed with HF PA that the currently plan is to attempt to wean pt of inotrope's prior to discharge.  CM was able to gain authorization for [facility] HF orders (HF RN and other modalities as determined necessary per RN initial visit) through [place] for coverage starting 05/15/17 for approximately 21 days - attending will be able to request more visits if needed.  CM faxed H&P, HHHF (including [place] for initial assessment)  order and demographics order to [phone] today.  [employer] [facility] auth Intake ID# [id number] and authorization number [id number] - with services beginning the day of or day after discharge -  Pt offered choice for HHRN/HHHF  and pt chose [facility] - agency contacted and referral accepted for HHRN/HHHF orders.  CM instructed by [facility] to contact [facility] on day of discharge to inform of discharge.  CM again requested attending service to complete sleep  study form on shadow chart so study can be arranged.  CM also requested ambulatory note for home oxygen if needed - choice offered pt chose [facility] however no order written yet, agency accepted referral  Successful Cardioversion performed today - pt is now in NSR  Pt discussed in LOS 6/19 - remains appropriate for continued stay.  Pt remains on dobutamine drip - planned redo on cardioversion today.  Pt is now refusing SNF and wants to go home with [facility].  CM will continue to follow for discharge needs.  [facility] following for possible IV inotrope needs post discharge.  CM  contacted [employer] CM and informed of pending discharge home with HH/infusion needs once pt is NSR for 48 hours  05/11/17 Unfortunately pt reverted back into AFib and developed shock - both dobutamine and amiodarone had to be restarted.  Pt is also on IV lasix - scheduled for another Cardioversion 6/19.  CM will continue to follow for discharge needs (SNF vs Home with possible inotropes).  05/08/17 Pt is now s/p cardioversion, dobutamine is off.  However, pt is now in AFIB and IV lasix restarted.  Pt is now refusing PT eval and stating she wants to discharge home.  CM spoke with [place] Luisa Hart [phone] ext [phone] - request call back if pt is to discharge home and needs [facility] - so she can expedite [facility] approval (if infusion is not needed).  CM contacted HF team and requested additonal assessment with pt at bedside as pt is now refusing PT, walking and SNF.  HF NP agrees that pt would be safely discharged to SNF - will discuss directly with pt   05/05/17 Pt will have Cardioversion tomorrow - attending with then attempt to wean her off the dobutamine - if this is not successful pt will need to discharge with inotrope - attending aware that current recommendation is SNF.  CSW aware that pt may discharge to SNF with inotropic drug.  05/01/17 SNF recommended - CSW consulted  CM contacted [facility] and verified [facility] is in [place] verified both HH and IV infusion orders can be submitted via [facility] directly.  CM will continue to follow for discharge needs.    Pt started on inotrope and IV amio yesterday.  CM contacted [facility] and informed of tentative referral for IV infusion and RN.  Per [facility] - agency will seek insurance auth for both IV infusion and HHRN.   As a back up plan ; CM requested HHRN order from PA - once order is received CM will verify process with [employer].  Sleep study order form placed on shadow chart - PA aware [name], RN 05/15/2017, 9:29 AM

## 2017-05-16 LAB — RENAL FUNCTION PANEL
ALBUMIN: 3.1 g/dL — AB (ref 3.5–5.0)
Anion gap: 7 (ref 5–15)
BUN: 13 mg/dL (ref 6–20)
CO2: 33 mmol/L — ABNORMAL HIGH (ref 22–32)
CREATININE: 1.69 mg/dL — AB (ref 0.44–1.00)
Calcium: 8.8 mg/dL — ABNORMAL LOW (ref 8.9–10.3)
Chloride: 98 mmol/L — ABNORMAL LOW (ref 101–111)
GFR calc Af Amer: 36 mL/min — ABNORMAL LOW (ref >60)
GFR, EST NON AFRICAN AMERICAN: 31 mL/min — AB (ref >60)
GLUCOSE: 84 mg/dL (ref 65–99)
Phosphorus: 4.1 mg/dL (ref 2.5–4.6)
Potassium: 4.2 mmol/L (ref 3.5–5.1)
Sodium: 138 mmol/L (ref 135–145)

## 2017-05-16 LAB — PROTIME-INR
INR: 3.3
PROTHROMBIN TIME: 34.3 s — AB (ref 11.4–15.2)

## 2017-05-16 LAB — COOXEMETRY PANEL
CARBOXYHEMOGLOBIN: 1.3 % (ref 0.5–1.5)
Methemoglobin: 1.1 % (ref 0.0–1.5)
O2 Saturation: 73.2 %
Total hemoglobin: 12.3 g/dL (ref 12.0–16.0)

## 2017-05-16 MED ORDER — LOSARTAN POTASSIUM 25 MG PO TABS: 12.5000 mg | ORAL_TABLET | Freq: Every day | ORAL | 5 refills | Status: DC

## 2017-05-16 MED ORDER — SPIRONOLACTONE 25 MG PO TABS: 25.0000 mg | ORAL_TABLET | Freq: Every day | ORAL | 3 refills | Status: DC

## 2017-05-16 MED ORDER — RANOLAZINE ER 500 MG PO TB12: 500.0000 mg | ORAL_TABLET | Freq: Two times a day (BID) | ORAL | 3 refills | Status: DC

## 2017-05-16 MED ORDER — FUROSEMIDE 40 MG PO TABS: 40.0000 mg | ORAL_TABLET | Freq: Two times a day (BID) | ORAL | 3 refills | Status: DC

## 2017-05-16 MED ORDER — AMIODARONE HCL 200 MG PO TABS: ORAL_TABLET | ORAL | 4 refills | Status: DC

## 2017-05-16 MED ORDER — ISOSORB DINITRATE-HYDRALAZINE 20-37.5 MG PO TABS: 2.0000 | ORAL_TABLET | Freq: Three times a day (TID) | ORAL | 3 refills | Status: DC

## 2017-05-16 MED ORDER — POTASSIUM CHLORIDE CRYS ER 20 MEQ PO TBCR: 20.0000 meq | EXTENDED_RELEASE_TABLET | Freq: Every day | ORAL | 3 refills | Status: DC

## 2017-05-16 NOTE — Progress Notes (Signed)
Patient ID: Patricia Wagner, female   DOB: 06-18-1951, 66 y.o.   MRN: [id number]     Primary Cardiologist: Dr. [name]  Subjective:    Admitted 04/29/17 with acute respiratory distress in the setting of atrial fibrillation and volume overload. Hypotensive, started on dobutamine, Amio gtt and IV diuresis.   Transitioned to po Amio 05/03/17, DCCV with return to NSR on 05/06/17, unfortunately went back into Afib on 05/08/17 (IV Amio restarted) thus developed recurrent shock and volume overload. Dobutamine restarted.  Repeat DCCV on 05/12/17. Dobutamine stopped 6/21. Remains in NSR. Co-ox 73%.   Weight down 2 lbs.  On po Lasix.  Creatinine stable.  Wants to go home.    Objective:   Weight Range: 244 lb 14.4 oz (111.1 kg) Body mass index is 40.75 kg/m.   Vital Signs:   Temp:  [98.3 F (36.8 C)-98.7 F (37.1 C)] 98.7 F (37.1 C) (06/23 0734) Pulse Rate:  [66-94] 78 (06/23 0734) Resp:  [16-26] 20 (06/23 0734) BP: (93-130)/(55-91) 115/78 (06/23 0734) SpO2:  [76 %-99 %] 96 % (06/23 0734) Weight:  [244 lb 14.4 oz (111.1 kg)] 244 lb 14.4 oz (111.1 kg) (06/23 0559) Last BM Date: 05/14/17  Weight change: Filed Weights   05/14/17 0523 05/15/17 0606 05/16/17 0559  Weight: 246 lb 14.6 oz (112 kg) 246 lb 4.1 oz (111.7 kg) 244 lb 14.4 oz (111.1 kg)    Intake/Output:   Intake/Output Summary (Last 24 hours) at 05/16/17 1055 Last data filed at 05/16/17 0737  Gross per 24 hour  Intake              240 ml  Output             1700 ml  Net            -1460 ml     Physical Exam: General: Elderly appearing female. NAD. Lying in bed.  HEENT: Normal Neck: Supple. JVP 8 cm. Carotids 2+ bilat; no bruits. No thyromegaly or nodule noted. Cor: PMI nondisplaced. RRR, No M/G/R noted Lungs: CTAB, normal effort. Abdomen: Soft, non-tender, non-distended, no HSM. No bruits or masses. +BS  Extremities: No cyanosis, clubbing, rash.  1+ ankle edema.  Neuro: Alert & orientedx3, cranial nerves grossly intact.  moves all 4 extremities w/o difficulty. Affect pleasant   Telemetry: NSR, personally reviewed.   Labs: CBC No results for input(s): WBC, NEUTROABS, HGB, HCT, MCV, PLT in the last 72 hours. Basic Metabolic Panel  Recent Labs  05/15/17 0415 05/16/17 0418  NA 139 138  K 3.9 4.2  CL 97* 98*  CO2 36* 33*  GLUCOSE 88 84  BUN 13 13  CREATININE 1.64* 1.69*  CALCIUM 8.9 8.8*  PHOS 3.8 4.1   Liver Function Tests  Recent Labs  05/15/17 0415 05/16/17 0418  ALBUMIN 3.1* 3.1*    BNP: BNP (last 3 results)  Recent Labs  10/31/16 1246 11/20/16 1121 04/29/17 0935  BNP 1,218.8* 1,555.6* 2,712.1*     Imaging/Studies: Transesophageal Echocardiography 03/25/17 Study Conclusions  - Left ventricle: Systolic function was severely reduced. The   estimated ejection fraction was 15%. Diffuse hypokinesis. - Aortic valve: No evidence of vegetation. - Mitral valve: No evidence of vegetation. There was mild to   moderate regurgitation. - Left atrium: The atrium was moderately dilated. No evidence of   thrombus in the atrial cavity or appendage. - Right ventricle: The cavity size was mildly dilated. Systolic   function was severely reduced. - Right atrium: The atrium was mildly dilated. -  Atrial septum: No defect or patent foramen ovale was identified. - Tricuspid valve: No evidence of vegetation. There was moderate   regurgitation. - Pulmonic valve: No evidence of vegetation. - Pericardium, extracardiac: A trivial pericardial effusion was   identified.  Impressions:  - Severe LV dysfunction; mild to moderate MR; moderate LAE; no LAA   thrombus; mild RAE and RVE; severely reduced RV function;   moderate TR.   Medications:     Scheduled Medications: . amiodarone  400 mg Oral BID  . furosemide  80 mg Intravenous Once  . furosemide  40 mg Oral BID  . isosorbide-hydrALAZINE  2 tablet Oral TID  . losartan  12.5 mg Oral Daily  . potassium chloride  40 mEq Oral Daily  .  ranolazine  500 mg Oral BID  . rosuvastatin  10 mg Oral Daily  . sodium chloride flush  10-40 mL Intracatheter Q12H  . spironolactone  25 mg Oral Daily  . Warfarin - Pharmacist Dosing Inpatient   Does not apply q1800    Infusions:   PRN Medications: acetaminophen, loperamide, ondansetron (ZOFRAN) IV, sodium chloride flush   Assessment/Plan   1. Acute on chronic biventricular CHF: Nonischemic cardiomyopathy.  TEE 03/25/17 with LVEF 15% and severely reduced RV function. Developed recurrent shock in the setting of Afib RVR, now s/p DCCV x 2. Remains in NSR this morning.  Volume status looks ok, she is on po Lasix.  Co-ox 73%. Creatinine stable.  - Continue current cardiac meds. 2. Persistent Afib: Cannot tolerate AF with recurrent shock. S/p DCCV on 5/2 (she stopped Amio after this) and DCCV on 05/06/17 and again 05/12/17. Remains in NSR today.  - Continue Ranexa 500 mg BID.  - Continue amiodarone 400 mg bid.   - Continue warfarin for anticoagulation, adjust dose with high INR today.  3. Acute hypercapnic respiratory failure: Suspect OSA at baseline.  Continue CPAP with sleeping.  - Sleep study order completed for outpatient.   4. 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  Baseline creatinine 1.6.  Creatinine now back close to baseline.  5. COPD: No wheezing noted.  6. PT recommended SNF.   - pt. Wants to go home with home health services.  7. Disposition: Home today, needs home health/PT.  She will need followup in CHF clinic and coumadin clinic.  She will need outpatient sleep study.  Meds for home: warfarin, amiodarone 400 mg bid x 6 days then 200 mg bid x 7 days then 200 mg daily, ranolazine 500 mg bid, Lasix 40 mg po bid, KCl 20 mEq daily, losartan 12.5 mg daily, Bidil 2 tabs tid, spironolactone 25 mg daily, Crestor 10 mg daily.   Loralie Champagne, MD 05/16/2017   Advanced Heart Failure Team Pager [phone] (M-F; [place])  Please contact [facility] for  night-coverage after hours (4p -7a ) and weekends on [email or web address]

## 2017-05-16 NOTE — Progress Notes (Signed)
ANTICOAGULATION CONSULT NOTE - Follow Up Consult  Pharmacy Consult for Coumadin Indication: atrial fibrillation  Allergies  Allergen Reactions  . Ace Inhibitors Palpitations    Patient Measurements: Height: 5\' 5"  (165.1 cm) Weight: 244 lb 14.4 oz (111.1 kg) IBW/kg (Calculated) : 57  Vital Signs: Temp: 98.7 F (37.1 C) (06/23 0734) Temp Source: Oral (06/23 0734) BP: 115/78 (06/23 0734) Pulse Rate: 78 (06/23 0734)  Labs:  Recent Labs  05/14/17 0523 05/15/17 0415 05/16/17 0418  LABPROT 29.3* 30.8* 34.3*  INR 2.71 2.88 3.30  CREATININE 1.71* 1.64* 1.69*    Estimated Creatinine Clearance: 42.3 mL/min (A) (by C-G formula based on SCr of 1.69 mg/dL (H)).  Assessment: 65yof continues on coumadin for afib, s/p DCCV on 6/13 and again 6/19. Maintaining NSR, amiodarone switched to PO today. INR is now above desired target range after Warfarin 3mg  yesterday (3.3), which may be reflective of Amiodarone/Warfarin drug interactions.  Change is 4 second bump - no bleeding complications noted.  Home dose: 3mg  daily  Goal of Therapy:  INR 2-3 Monitor platelets by anticoagulation protocol: Yes   Plan:  1) Hold Warfarin tonight 2) Daily INR  Rober Minion, PharmD., MS Clinical Pharmacist Pager:  [phone] Thank you for allowing pharmacy to be part of this patients care team. 05/16/2017,7:49 AM

## 2017-05-16 NOTE — Discharge Summary (Signed)
Advanced Heart Failure Discharge Note  Discharge Summary   Patient ID: Patricia Wagner MRN: [id number], DOB/AGE: [birth year] 64 y.o. Admit date: 04/29/2017 D/C date:     05/16/2017   Primary Discharge Diagnoses:  1. Acute on chronic biventricular CHF 2. Persistent atrial fibrillation 3. NICM 4. Acute hypercapnic respiratory failure 5. AKI on CKD III 6. COPD   Hospital Course: Patricia Wagner is a 66 year old female with a past medical history of persistent atrial fibrillation, NICM cardiomyopathy, Biventricular CHF (LVEF 15% and RV severely reduced), HTN, and HLD.   She has struggled with symptomatic afib since 10/2016. Has successful DCCV 10/2016. Failed DCCV 03/2017, after stopping her amiodarone. Seen in A-Fib clinic 04/09/17 and planned for repeat DCCV after further amio load.   Pt admitted to [facility] 04/29/17 with worsening fatigue, SOB, and peripheral edema over past week.  Developed respiratory distress and called EMS. Noted to be hypotensive into 80s. BiPAP started in ED. She was started on Amio gtt. PICC line was placed for dobutamine and low output heart failure.   She transitioned to po Amio on 05/03/17, underwent DCCV on 05/06/17 with return to NSR. Unfortunately went back into Afib on 05/08/17 and developed recurrent cardiogenic shock. Thus, dobutamine and Amio gtt's restarted.   She had a repeat DCCV on 05/12/17 with successful return to NSR. Her amiodarone gtt was continued for 3 days post DCCV to ensure adequate anti-arrhythmic load. She transitioned to po Amio on 05/15/17. Plan for Amio 400mg  BID x 7 days, then 200 mg BID. Ranexa was added to her regimen for added anti-arrhythmic benefit. Her warfarin was continued for anticoagulation, CHA2DS2-VASc score is 4.   It was felt that a contributor to her atrial fibrillation was untreated sleep apnea. Sleep study was ordered for her to complete in the outpatient setting.   Patricia Wagner did have some AKI during her admission which stabilized after  diuresis and cardiogenic shock resolved. Her HF medication regimen was optimized with Arlyce Harman, Bidil and losartan. She will need a beta blocker added to her regimen, with resolving shock this was delayed but we can add in the outpatient setting.   Throughout her admission she was diuresed with IV Lasix, diuresed 42 pounds total. She transitioned to po Lasix, discharge weight 244 pounds. It was recommended that she go to SNF but she declined. She will be discharged home with home health services. She will follow up in the CHF clinic in the next 5 days.   Discharge meds:  warfarin, amiodarone 400 mg bid x 6 days then 200 mg bid x 7 days then 200 mg daily, ranolazine 500 mg bid, Lasix 40 mg po bid, KCl 20 mEq daily, losartan 12.5 mg daily, Bidil 2 tabs tid, spironolactone 25 mg daily, Crestor 10 mg daily. INR 3.3 today. Will arrange for an INR check early next week ( a staff message has been sent to the [facility]). In the meantime she will HOLD her coumadin tonight and tomorrow night and restart on Monday night at 1.5mg  daily (1/2 tab) until she can be seen in coumadin clinic. Discussed with Rober Minion Pharm-D.   Discharge Weight: 244 pounds.  Discharge Vitals: Blood pressure 115/78, pulse 78, temperature 98.7 F (37.1 C), temperature source Oral, resp. rate 20, height 5\' 5"  (1.651 m), weight 244 lb 14.4 oz (111.1 kg), SpO2 96 %.  Labs: Lab Results  Component Value Date   WBC 5.1 05/13/2017   HGB 12.0 05/13/2017   HCT 41.2 05/13/2017  MCV 91.2 05/13/2017   PLT 155 05/13/2017     Recent Labs Lab 05/16/17 0418  NA 138  K 4.2  CL 98*  CO2 33*  BUN 13  CREATININE 1.69*  CALCIUM 8.8*  GLUCOSE 84   Lab Results  Component Value Date   CHOL 144 11/21/2016   HDL 43 11/21/2016   LDLCALC 89 11/21/2016   TRIG 60 11/21/2016   BNP (last 3 results)  Recent Labs  10/31/16 1246 11/20/16 1121 04/29/17 0935  BNP 1,218.8* 1,555.6* 2,712.1*    ProBNP (last 3 results) No  results for input(s): PROBNP in the last 8760 hours.   Diagnostic Studies/Procedures   DCCV 05/06/17 DCCV 05/12/17  Discharge Medications   Allergies as of 05/16/2017      Reactions   Ace Inhibitors Palpitations      Medication List    STOP taking these medications   carvedilol 6.25 MG tablet Commonly known as:  COREG   sacubitril-valsartan 49-51 MG Commonly known as:  ENTRESTO     TAKE these medications   acetaminophen 325 MG tablet Commonly known as:  TYLENOL Take 650 mg by mouth every 6 (six) hours as needed (pain).   amiodarone 200 MG tablet Commonly known as:  PACERONE amiodarone 400 mg (two tablets) twice daily x 6 days then 200 mg (1 table) twice daily x 7 days then 200 mg daily thereafter What changed:  additional instructions   diclofenac sodium 1 % Gel Commonly known as:  VOLTAREN Apply 4 g topically 4 (four) times daily as needed (As directed for skin).   furosemide 40 MG tablet Commonly known as:  LASIX Take 1 tablet (40 mg total) by mouth 2 (two) times daily. What changed:  how much to take   isosorbide-hydrALAZINE 20-37.5 MG tablet Commonly known as:  BIDIL Take 2 tablets by mouth 3 (three) times daily.   losartan 25 MG tablet Commonly known as:  COZAAR Take 0.5 tablets (12.5 mg total) by mouth daily. Start taking on:  05/17/2017   naproxen sodium 220 MG tablet Commonly known as:  ANAPROX Take 220 mg by mouth daily as needed (pain).   potassium chloride SA 20 MEQ tablet Commonly known as:  KLOR-CON M20 Take 1 tablet (20 mEq total) by mouth daily. What changed:  when to take this   ranolazine 500 MG 12 hr tablet Commonly known as:  RANEXA Take 1 tablet (500 mg total) by mouth 2 (two) times daily.   rosuvastatin 10 MG tablet Commonly known as:  CRESTOR Take 1 tablet (10 mg total) by mouth daily.   spironolactone 25 MG tablet Commonly known as:  ALDACTONE Take 1 tablet (25 mg total) by mouth daily. Start taking on:  05/17/2017     warfarin 3 MG tablet Commonly known as:  COUMADIN Take 1 tablet by mouth daily or as directed by coumadin clinic What changed:  how much to take  how to take this  when to take this  additional instructions            Durable Medical Equipment        Start     Ordered   05/15/17 0928  For home use only DME Tub bench  Once     05/15/17 0350   05/15/17 0927  For home use only DME Walker rolling  Once    Question:  Patient needs a walker to treat with the following condition  Answer:  Assistance needed for mobility   05/15/17 0927  05/15/17 0924  For home use only DME 3 n 1  Once     05/15/17 4174   05/01/17 0855  Heart failure home health orders  (Heart failure home health orders / Face to face)  Once    Comments:  Heart Failure Follow-up Care:  Verify follow-up appointments per Patient Discharge Instructions. Confirm transportation arranged. Reconcile home medications with discharge medication list. Remove discontinued medications from use. Assist patient/caregiver to manage medications using pill box. Reinforce low sodium food selection Assessments: Vital signs and oxygen saturation at each visit. Assess home environment for safety concerns, caregiver support and availability of low-sodium foods. Consult Education officer, museum, PT/OT, Dietitian, and CNA based on assessments. Perform comprehensive cardiopulmonary assessment. Notify MD for any change in condition or weight gain of 3 pounds in one day or 5 pounds in one week with symptoms. Daily Weights and Symptom Monitoring: Ensure patient has access to scales. Teach patient/caregiver to weigh daily before breakfast and after voiding using same scale and record.    Teach patient/caregiver to track weight and symptoms and when to notify Provider. Activity: Develop individualized activity plan with patient/caregiver.  Please provide Dobutamine 2.5 mcg/kg/min x 12 months.  (ORDER FOR INSURANCE PURPOSES, NOT FINAL)  Labs every 2  weeks.  Question Answer Comment  Heart Failure Follow-up Care Advanced Heart Failure (AHF) Clinic at [phone]   Obtain the following labs Basic Metabolic Panel   Lab frequency Other see comments   Fax lab results to AHF Clinic at [phone]   Diet Low Sodium Heart Healthy   Fluid restrictions: 2000 mL Fluid      05/01/17 0855      Disposition   The patient will be discharged in stable condition to home.  Follow-up Information    [facility] Follow up on 05/20/2017.   Specialty:  Cardiology Why:  at 1000 am for post hospital followup. Please bring all of your medications to your visit. The code for parking is 6002. Leisure centre manager through [employer] off of [address]. Underground parking on your right.  Can also park in lower ED lot and enter blue awning. Contact information: [address] [id number] [place], [facility] Follow up.   Why:  Registered Nurse and all other [place] determined neccessary by registered nurse intial assessement Contact information: [address] [place] [address] [phone]        Index Follow up.   Why:  bedside commode, tub bench and rolling walker.  Additonal equipment relating to Heart Failure manangement will also be provided Contact information: [address] [place] [address] [phone]             Duration of Discharge Encounter: Greater than 35 minutes   Signed, Angelena Form, PA-C 05/16/2017, 11:46 AM

## 2017-05-16 NOTE — Discharge Instructions (Addendum)
PLEASE HOLD YOUR COUMADIN TONIGHT AND TOMORROW NIGHT. YOU CAN RESUME AT 1.5 MG (1/2 TABLET) ON Monday 6/25 UNTIL SEEN IN COUMADIN CLINIC

## 2017-05-16 NOTE — Progress Notes (Signed)
SATURATION QUALIFICATIONS: (This note is used to comply with regulatory documentation for home oxygen)  Patient Saturations on Room Air at Rest = 97%  Patient Saturations on Room Air while Ambulating = 90%  Patient Saturations on 2 Liters of oxygen while Ambulating = 94%  Please briefly explain why patient needs home oxygen: Pt desats at night or during the day when sleeping.

## 2017-05-16 NOTE — Care Management (Signed)
CM requested bedside nurse to perform ambulation test again prior to discharge and document on the  pulmonary note templete to determine if pt needs oxygen in the home setting - pt documented in Epic as requiring 2 liters [place].  [facility] aware of tentative referral.  CM consult will need to be ordered if pt does in fact require home oxygen.  CM text paged HF team to inform of home oxygen concern and to request that sleep study form be completed and faxed directly to sleep center.

## 2017-05-19 ENCOUNTER — Ambulatory Visit (INDEPENDENT_AMBULATORY_CARE_PROVIDER_SITE_OTHER): Payer: Managed Care, Other (non HMO) | Admitting: Pharmacist

## 2017-05-19 DIAGNOSIS — Z7901 Long term (current) use of anticoagulants: Secondary | ICD-10-CM | POA: Diagnosis not present

## 2017-05-19 LAB — POCT INR: INR: 2.4

## 2017-05-20 ENCOUNTER — Telehealth (HOSPITAL_COMMUNITY): Payer: Self-pay

## 2017-05-20 ENCOUNTER — Ambulatory Visit (HOSPITAL_COMMUNITY)
Admit: 2017-05-20 | Discharge: 2017-05-20 | Disposition: A | Discharge status: Home / Self Care | Payer: Managed Care, Other (non HMO) | Attending: Internal Medicine | Admitting: Internal Medicine

## 2017-05-20 ENCOUNTER — Encounter (HOSPITAL_COMMUNITY): Payer: Self-pay

## 2017-05-20 VITALS — BP 124/66 | HR 75 | Wt 245.2 lb

## 2017-05-20 DIAGNOSIS — I2510 Atherosclerotic heart disease of native coronary artery without angina pectoris: Secondary | ICD-10-CM | POA: Diagnosis not present

## 2017-05-20 DIAGNOSIS — Z7901 Long term (current) use of anticoagulants: Secondary | ICD-10-CM | POA: Diagnosis not present

## 2017-05-20 DIAGNOSIS — J449 Chronic obstructive pulmonary disease, unspecified: Secondary | ICD-10-CM | POA: Insufficient documentation

## 2017-05-20 DIAGNOSIS — I517 Cardiomegaly: Secondary | ICD-10-CM | POA: Insufficient documentation

## 2017-05-20 DIAGNOSIS — N183 Chronic kidney disease, stage 3 unspecified: Secondary | ICD-10-CM | POA: Insufficient documentation

## 2017-05-20 DIAGNOSIS — I10 Essential (primary) hypertension: Secondary | ICD-10-CM

## 2017-05-20 DIAGNOSIS — I481 Persistent atrial fibrillation: Secondary | ICD-10-CM | POA: Diagnosis present

## 2017-05-20 DIAGNOSIS — R29818 Other symptoms and signs involving the nervous system: Secondary | ICD-10-CM | POA: Insufficient documentation

## 2017-05-20 DIAGNOSIS — I509 Heart failure, unspecified: Secondary | ICD-10-CM | POA: Diagnosis not present

## 2017-05-20 DIAGNOSIS — F17210 Nicotine dependence, cigarettes, uncomplicated: Secondary | ICD-10-CM | POA: Insufficient documentation

## 2017-05-20 DIAGNOSIS — I429 Cardiomyopathy, unspecified: Secondary | ICD-10-CM | POA: Diagnosis not present

## 2017-05-20 DIAGNOSIS — Z8673 Personal history of transient ischemic attack (TIA), and cerebral infarction without residual deficits: Secondary | ICD-10-CM | POA: Diagnosis not present

## 2017-05-20 DIAGNOSIS — I7300 Raynaud's syndrome without gangrene: Secondary | ICD-10-CM | POA: Diagnosis not present

## 2017-05-20 DIAGNOSIS — I440 Atrioventricular block, first degree: Secondary | ICD-10-CM | POA: Insufficient documentation

## 2017-05-20 DIAGNOSIS — E785 Hyperlipidemia, unspecified: Secondary | ICD-10-CM | POA: Insufficient documentation

## 2017-05-20 DIAGNOSIS — I130 Hypertensive heart and chronic kidney disease with heart failure and stage 1 through stage 4 chronic kidney disease, or unspecified chronic kidney disease: Secondary | ICD-10-CM | POA: Diagnosis not present

## 2017-05-20 HISTORY — DX: Other symptoms and signs involving the nervous system: R29.818

## 2017-05-20 LAB — BASIC METABOLIC PANEL
ANION GAP: 6 (ref 5–15)
BUN: 19 mg/dL (ref 6–20)
CHLORIDE: 100 mmol/L — AB (ref 101–111)
CO2: 32 mmol/L (ref 22–32)
Calcium: 9 mg/dL (ref 8.9–10.3)
Creatinine, Ser: 1.89 mg/dL — ABNORMAL HIGH (ref 0.44–1.00)
GFR calc Af Amer: 32 mL/min — ABNORMAL LOW (ref >60)
GFR, EST NON AFRICAN AMERICAN: 27 mL/min — AB (ref >60)
GLUCOSE: 77 mg/dL (ref 65–99)
POTASSIUM: 4 mmol/L (ref 3.5–5.1)
Sodium: 138 mmol/L (ref 135–145)

## 2017-05-20 LAB — MAGNESIUM: Magnesium: 2 mg/dL (ref 1.7–2.4)

## 2017-05-20 MED ORDER — LOSARTAN POTASSIUM 25 MG PO TABS: 25.0000 mg | ORAL_TABLET | Freq: Every day | ORAL | 3 refills | Status: DC

## 2017-05-20 MED ORDER — AMIODARONE HCL 200 MG PO TABS: 200.0000 mg | ORAL_TABLET | Freq: Every day | ORAL | 6 refills | Status: DC

## 2017-05-20 NOTE — Progress Notes (Signed)
Advanced Heart Failure Medication Review by a Pharmacist  Does the patient  feel that his/her medications are working for him/her?  yes  Has the patient been experiencing any side effects to the medications prescribed?  no  Does the patient measure his/her own blood pressure or blood glucose at home?  no   Does the patient have any problems obtaining medications due to transportation or finances?   no  Understanding of regimen: poor Understanding of indications: poor Potential of compliance: poor Patient understands to avoid NSAIDs. Patient understands to avoid decongestants.  Issues to address at subsequent visits: asked her to bring in pill box   Pharmacist comments: Ms. Goin is a pleasant 66 yo female presenting with family member but without meds or list. Patient has very poor understanding of her medication regimen but can recognize some names of medications. Patient reports taking furosemide twice a day, second dose at 10PM. Patient denies bothersome nocturia. Advised that she take her second dose no later than 4PM. Per patient, she is out of rosuvastatin, almost out of potassium. No other med issues at this time. Asked patient to bring pill bottles to next appointment.  Carlean Jews, Pharm.D. PGY1 Pharmacy Resident 6/27/201810:44 AM Pager [phone]   Time with patient: 10 mins Preparation and documentation time: 4 mins Total time: 14 mins

## 2017-05-20 NOTE — Telephone Encounter (Signed)
CHF Clinic appointment reminder call placed to patient for upcoming post-hospital follow up.  LVMTCB to confirm apt.  Patient also reminded to take all medications as prescribed on the day of his/her appointment and to bring all medications to this appointment.  Advised to call our office for tardiness or cancellations/rescheduling needs.  .Bradley, Megan Genevea  

## 2017-05-20 NOTE — Progress Notes (Addendum)
Advanced Heart Failure Clinic Note    Primary Cardiologist: Dr. [name] Primary HF: [name]   HPI:  Patricia Wagner is a 66 y.o. female 66 year old female with a past medical history of persistent atrial fibrillation, NICM cardiomyopathy, Biventricular CHF (LVEF 15% and RV severely reduced by TEE 03/25/17), HTN, and HLD.   She has struggled with symptomatic afib since 10/2016. Has successful DCCV 10/2016. Failed DCCV 03/2017, after stopping her amiodarone. Seen in A-Fib clinic 04/09/17 and planned for repeat DCCV after further amio load.   Pt admitted to [facility] 6/6 - 05/16/17. Presented with worsening fatigue and SOB, called EMS.  Hypotensive into 80s and required BiPAP.  Started on amio gtt and PICC line placed for dobutamine with low output HF. Underwent DCCV 05/06/17 but went back into Afib 05/08/17 and developed recurrent cardiogenic shock, requiring re-initiation of dobutamine and amio.   Pt had repeat DCCV 05/12/17 with successful conversion to NSR. Amio gtt continued to ensure adequate load. Transitioned to po 05/15/17. Ranexa also added to her regimen for added anti-arrhythmic benefit. Sleep study ordered to be completed in inpatient setting. HF meds optimized as tolerated.  PT recommended SNF, but pt preferred to return home with HHPT.   Pt presents today for post hospital follow up. Feeling much better. Denies SOB getting around house, but not very active yet. PT starts tomorrow. Denies orthopnea, PND, or CP. No lightheadedness or dizziness. Denies peripheral edema. Energy level slowly improving. Denies palpitations or tachypalpitations. No near syncope. Urine output steady.   EKG: Personally reviewed, NSR with 1st degree AV block 72 bpm, Possible LAE.   Review of systems complete and found to be negative unless listed in HPI.    Past Medical History:  Diagnosis Date  . Arthritis   . Atrial fibrillation ([place])    a. s/p multiple cardioversions; failed tikosyn/sotalol.  Marland Kitchen CAD in  native artery, s/p cardiac cath with non obstructive CAD 10/24/2016  . CHF (congestive heart failure) ([place])   . DIVERTICULITIS, HX OF 07/25/2007  . DIVERTICULOSIS, COLON 07/22/2007  . Dyspnea   . Edema, peripheral    a. chronic BLE edema, R>L. Prior trauma from dog attack and accident.  Marland Kitchen HYPERLIPIDEMIA 02/03/2008  . Hypersomnia    declines w/u  . Hypertension   . MENOPAUSAL DISORDER 01/09/2011  . Morbid obesity ([place]) 07/22/2007  . NICM (nonischemic cardiomyopathy) ([place]) 10/24/2016  . Raynaud's syndrome 07/22/2007  . Stroke ([place]) 2017  . THYROID NODULE, RIGHT 01/04/2010  . VITAMIN D DEFICIENCY 01/09/2011   Qualifier: Diagnosis of  By: [name], [name]     Current Outpatient Prescriptions  Medication Sig Dispense Refill  . acetaminophen (TYLENOL) 325 MG tablet Take 650 mg by mouth every 6 (six) hours as needed (pain).     Marland Kitchen amiodarone (PACERONE) 200 MG tablet Take 200 mg by mouth 2 (two) times daily.    . diclofenac sodium (VOLTAREN) 1 % GEL Apply 4 g topically 4 (four) times daily as needed (As directed for skin).    . furosemide (LASIX) 40 MG tablet Take 1 tablet (40 mg total) by mouth 2 (two) times daily. 180 tablet 3  . isosorbide-hydrALAZINE (BIDIL) 20-37.5 MG tablet Take 2 tablets by mouth 3 (three) times daily. 180 tablet 3  . losartan (COZAAR) 25 MG tablet Take 0.5 tablets (12.5 mg total) by mouth daily. 90 tablet 5  . potassium chloride SA (KLOR-CON M20) 20 MEQ tablet Take 1 tablet (20 mEq total) by mouth daily. 180 tablet  3  . ranolazine (RANEXA) 500 MG 12 hr tablet Take 1 tablet (500 mg total) by mouth 2 (two) times daily. 60 tablet 3  . rosuvastatin (CRESTOR) 10 MG tablet Take 1 tablet (10 mg total) by mouth daily. 90 tablet 3  . spironolactone (ALDACTONE) 25 MG tablet Take 1 tablet (25 mg total) by mouth daily. 90 tablet 3  . warfarin (COUMADIN) 3 MG tablet Take 1 tablet by mouth daily or as directed by coumadin clinic (Patient taking differently: Take 3 mg by mouth daily. ) 30  tablet 3   No current facility-administered medications for this encounter.     Allergies  Allergen Reactions  . Ace Inhibitors Palpitations     Social History   Social History  . Marital status: Single    Spouse name: N/A  . Number of children: 1  . Years of education: N/A   Occupational History  . Printing     Social History Main Topics  . Smoking status: Current Every Day Smoker    Packs/day: 0.50    Types: Cigarettes    Last attempt to quit: 10/23/2016  . Smokeless tobacco: Never Used     Comment: Smoked for 50 years  . Alcohol use 2.4 oz/week    4 Cans of beer per week     Comment: Intermittent  . Drug use: No  . Sexual activity: Not on file   Other Topics Concern  . Not on file   Social History Narrative  . No narrative on file     Family History  Problem Relation Age of Onset  . Asthma Mother   . Diabetes Father   . Heart disease Father        Died of presumed heart attack - 24s  . Lung disease Sister   . Heart disease Sister        Twin sister has heart issue, unclear what kind  . Thyroid disease Neg Hx     Vitals:   05/20/17 1009  BP: 124/66  Pulse: 75  SpO2: 98%  Weight: 245 lb 3.2 oz (111.2 kg)    Wt Readings from Last 3 Encounters:  05/20/17 245 lb 3.2 oz (111.2 kg)  05/16/17 244 lb 14.4 oz (111.1 kg)  04/09/17 288 lb (130.6 kg)    PHYSICAL EXAM: General:  Well appearing. No respiratory difficulty. In [place].  HEENT: normal Neck: supple. no JVD. Carotids 2+ bilat; no bruits. No lymphadenopathy or thyromegaly appreciated. Cor: PMI nondisplaced. Regular rate & rhythm. No rubs, gallops or murmurs. Lungs: clear Abdomen: soft, nontender, nondistended. No hepatosplenomegaly. No bruits or masses. Good bowel sounds. Extremities: no cyanosis, clubbing, rash, edema Neuro: alert & oriented x 3, cranial nerves grossly intact. moves all 4 extremities w/o difficulty. Affect pleasant.  ECG: Personally reviewed, NSR with 1st degree AV block 72 bpm,  Possible LAE.   ASSESSMENT & PLAN:   1. Chronic biventricular CHF due to NICM - TEE 03/25/17 with LVEF 15% and severely reduced RV function.  - Required dobutamine during recent admission with cardiogenic shock in setting of Afib RVR. Weaned off prior to discharge.  - NYHA III symptoms.  - Volume status stable on exam. Continue lasix 40 mg BID. BMET and Mg today.  - No BB with recent low output.  - Increase losartan 25 mg daily. BMET and Mg today.  - Continue spironolactone 25 mg daily - Continue Bidil 2 tabs TID.  - Reinforced fluid restriction to < 2 L daily, sodium restriction to less  than 2000 mg daily, and the importance of daily weights.   2. Persistent Afib: Cannot tolerate AF with recurrent shock. S/p DCCV on 5/2 (she stopped Amio after this) and DCCV on 05/06/17 and again 05/12/17. Remains in NSR today.  - Continue Ranexa 500 mg BID.  - Continue amiodarone 200 mg BID. Decrease to once daily 05/29/17. - Continue warfarin. INR stable yesterday. Continue following with coumadin clinic.   3. Suspected Sleep Apnea: - Sleep study ordered.  4. CKD III: - BMET today.  5. COPD:  - No wheezing noted.   Keep close 2 week follow up. Labs today. Meds as above.   Shirley Friar, PA-C 05/20/17   Greater than 50% of the 25 minute visit was spent in counseling/coordination of care regarding disease state education, sliding scale diuretics, medication reconciliation, discussion of medical regimen with on-site Pharm-D, and salt/fluid restriction.

## 2017-05-20 NOTE — Patient Instructions (Addendum)
Routine lab work today. Will notify you of abnormal results, otherwise no news is good news!  INCREASE Losartan to 25 mg (1 whole tablet) once daily.  Continue Amiodarone 200 mg tablet twice daily until 05/29/2017, then reduce to 200 mg tablet ONCE DAILY from then on.  Will schedule you for sleep study at [facility]. Address: [place], [place], [place] [address] Phone: [phone]  ___________________________________________________  ___________________________________________________   Follow up 2 weeks with Oda Kilts PA-C. Take all medication as prescribed the day of your appointment. Bring all medications with you to your appointment.  Follow up 1-2 months with [name]. Take all medication as prescribed the day of your appointment. Bring all medications with you to your appointment.  Do the following things EVERYDAY: 1) Weigh yourself in the morning before breakfast. Write it down and keep it in a log. 2) Take your medicines as prescribed 3) Eat low salt foods-Limit salt (sodium) to 2000 mg per day.  4) Stay as active as you can everyday 5) Limit all fluids for the day to less than 2 liters

## 2017-05-22 ENCOUNTER — Ambulatory Visit (INDEPENDENT_AMBULATORY_CARE_PROVIDER_SITE_OTHER): Payer: Managed Care, Other (non HMO) | Admitting: Pharmacist

## 2017-05-22 DIAGNOSIS — Z7901 Long term (current) use of anticoagulants: Secondary | ICD-10-CM

## 2017-05-22 LAB — POCT INR: INR: 2.1

## 2017-05-26 ENCOUNTER — Encounter (HOSPITAL_COMMUNITY): Payer: Self-pay | Admitting: *Deleted

## 2017-06-03 ENCOUNTER — Ambulatory Visit (HOSPITAL_COMMUNITY)
Admission: RE | Admit: 2017-06-03 | Discharge: 2017-06-03 | Disposition: A | Discharge status: Home / Self Care | Payer: Self-pay | Source: Ambulatory Visit | Attending: Internal Medicine | Admitting: Internal Medicine

## 2017-06-03 ENCOUNTER — Encounter (HOSPITAL_COMMUNITY): Payer: Self-pay

## 2017-06-03 VITALS — BP 130/70 | HR 85 | Wt 239.2 lb

## 2017-06-03 DIAGNOSIS — F17210 Nicotine dependence, cigarettes, uncomplicated: Secondary | ICD-10-CM | POA: Insufficient documentation

## 2017-06-03 DIAGNOSIS — Z8673 Personal history of transient ischemic attack (TIA), and cerebral infarction without residual deficits: Secondary | ICD-10-CM | POA: Insufficient documentation

## 2017-06-03 DIAGNOSIS — Z825 Family history of asthma and other chronic lower respiratory diseases: Secondary | ICD-10-CM | POA: Insufficient documentation

## 2017-06-03 DIAGNOSIS — J449 Chronic obstructive pulmonary disease, unspecified: Secondary | ICD-10-CM | POA: Insufficient documentation

## 2017-06-03 DIAGNOSIS — Z7901 Long term (current) use of anticoagulants: Secondary | ICD-10-CM | POA: Insufficient documentation

## 2017-06-03 DIAGNOSIS — N183 Chronic kidney disease, stage 3 unspecified: Secondary | ICD-10-CM

## 2017-06-03 DIAGNOSIS — I5022 Chronic systolic (congestive) heart failure: Secondary | ICD-10-CM

## 2017-06-03 DIAGNOSIS — I428 Other cardiomyopathies: Secondary | ICD-10-CM | POA: Insufficient documentation

## 2017-06-03 DIAGNOSIS — I130 Hypertensive heart and chronic kidney disease with heart failure and stage 1 through stage 4 chronic kidney disease, or unspecified chronic kidney disease: Secondary | ICD-10-CM | POA: Insufficient documentation

## 2017-06-03 DIAGNOSIS — Z833 Family history of diabetes mellitus: Secondary | ICD-10-CM | POA: Insufficient documentation

## 2017-06-03 DIAGNOSIS — I481 Persistent atrial fibrillation: Secondary | ICD-10-CM | POA: Insufficient documentation

## 2017-06-03 DIAGNOSIS — R29818 Other symptoms and signs involving the nervous system: Secondary | ICD-10-CM

## 2017-06-03 DIAGNOSIS — M1990 Unspecified osteoarthritis, unspecified site: Secondary | ICD-10-CM | POA: Insufficient documentation

## 2017-06-03 DIAGNOSIS — E785 Hyperlipidemia, unspecified: Secondary | ICD-10-CM | POA: Insufficient documentation

## 2017-06-03 DIAGNOSIS — I5082 Biventricular heart failure: Secondary | ICD-10-CM | POA: Insufficient documentation

## 2017-06-03 DIAGNOSIS — Z888 Allergy status to other drugs, medicaments and biological substances status: Secondary | ICD-10-CM | POA: Insufficient documentation

## 2017-06-03 DIAGNOSIS — Z79899 Other long term (current) drug therapy: Secondary | ICD-10-CM | POA: Insufficient documentation

## 2017-06-03 DIAGNOSIS — I2510 Atherosclerotic heart disease of native coronary artery without angina pectoris: Secondary | ICD-10-CM | POA: Insufficient documentation

## 2017-06-03 DIAGNOSIS — I10 Essential (primary) hypertension: Secondary | ICD-10-CM

## 2017-06-03 DIAGNOSIS — E559 Vitamin D deficiency, unspecified: Secondary | ICD-10-CM | POA: Insufficient documentation

## 2017-06-03 DIAGNOSIS — Z6839 Body mass index (BMI) 39.0-39.9, adult: Secondary | ICD-10-CM | POA: Insufficient documentation

## 2017-06-03 DIAGNOSIS — Z8249 Family history of ischemic heart disease and other diseases of the circulatory system: Secondary | ICD-10-CM | POA: Insufficient documentation

## 2017-06-03 LAB — BASIC METABOLIC PANEL
Anion gap: 6 (ref 5–15)
BUN: 33 mg/dL — ABNORMAL HIGH (ref 6–20)
CALCIUM: 9.2 mg/dL (ref 8.9–10.3)
CO2: 31 mmol/L (ref 22–32)
Chloride: 98 mmol/L — ABNORMAL LOW (ref 101–111)
Creatinine, Ser: 1.79 mg/dL — ABNORMAL HIGH (ref 0.44–1.00)
GFR calc non Af Amer: 29 mL/min — ABNORMAL LOW (ref >60)
GFR, EST AFRICAN AMERICAN: 34 mL/min — AB (ref >60)
GLUCOSE: 95 mg/dL (ref 65–99)
Potassium: 4.8 mmol/L (ref 3.5–5.1)
Sodium: 135 mmol/L (ref 135–145)

## 2017-06-03 NOTE — Progress Notes (Signed)
Advanced Heart Failure Clinic Note    Primary Cardiologist: Dr. [name] Primary HF: [name]   HPI:  Patricia Wagner is a 66 y.o. female 66 year old female with a past medical history of persistent atrial fibrillation, NICM cardiomyopathy, Biventricular CHF (LVEF 15% and RV severely reduced by TEE 03/25/17), HTN, and HLD.   She has struggled with symptomatic afib since 10/2016. Has successful DCCV 10/2016. Failed DCCV 03/2017, after stopping her amiodarone. Seen in A-Fib clinic 04/09/17 and planned for repeat DCCV after further amio load.   Pt admitted to [facility] 6/6 - 05/16/17. Presented with worsening fatigue and SOB, called EMS.  Hypotensive into 80s and required BiPAP.  Started on amio gtt and PICC line placed for dobutamine with low output HF. Underwent DCCV 05/06/17 but went back into Afib 05/08/17 and developed recurrent cardiogenic shock, requiring re-initiation of dobutamine and amio.   Pt had repeat DCCV 05/12/17 with successful conversion to NSR. Amio gtt continued to ensure adequate load. Transitioned to po 05/15/17. Ranexa also added to her regimen for added anti-arrhythmic benefit. Sleep study ordered to be completed in inpatient setting. HF meds optimized as tolerated.  PT recommended SNF, but pt preferred to return home with HHPT.   Pt presents today for follow up. At last visit losartan increased. Weight down 6 lbs from last visit. Feels great. Denies SOB, but not very active. No SOB walking around [employer]. Denies lightheadedness or dizziness. PT  Was seeing at home, but now [facility] stopped as patient is not homebound. Denies palpitations, orthopnea, bendopnea, or tachy palpitations. No chest pain. She is ready to go back to work and frustrated with amount of paper work involved.   Review of systems complete and found to be negative unless listed in HPI.    Past Medical History:  Diagnosis Date  . Arthritis   . Atrial fibrillation ([place])    a. s/p multiple cardioversions; failed  tikosyn/sotalol.  Marland Kitchen CAD in native artery, s/p cardiac cath with non obstructive CAD 10/24/2016  . CHF (congestive heart failure) ([place])   . DIVERTICULITIS, HX OF 07/25/2007  . DIVERTICULOSIS, COLON 07/22/2007  . Dyspnea   . Edema, peripheral    a. chronic BLE edema, R>L. Prior trauma from dog attack and accident.  Marland Kitchen HYPERLIPIDEMIA 02/03/2008  . Hypersomnia    declines w/u  . Hypertension   . MENOPAUSAL DISORDER 01/09/2011  . Morbid obesity ([place]) 07/22/2007  . NICM (nonischemic cardiomyopathy) ([place]) 10/24/2016  . Raynaud's syndrome 07/22/2007  . Stroke ([place]) 2017  . THYROID NODULE, RIGHT 01/04/2010  . VITAMIN D DEFICIENCY 01/09/2011   Qualifier: Diagnosis of  By: [name], [name]     Current Outpatient Prescriptions  Medication Sig Dispense Refill  . acetaminophen (TYLENOL) 325 MG tablet Take 650 mg by mouth every 6 (six) hours as needed (pain).     Marland Kitchen amiodarone (PACERONE) 200 MG tablet Take 1 tablet (200 mg total) by mouth daily. 30 tablet 6  . diclofenac sodium (VOLTAREN) 1 % GEL Apply 4 g topically 4 (four) times daily as needed (As directed for skin).    . furosemide (LASIX) 40 MG tablet Take 1 tablet (40 mg total) by mouth 2 (two) times daily. 180 tablet 3  . isosorbide-hydrALAZINE (BIDIL) 20-37.5 MG tablet Take 2 tablets by mouth 3 (three) times daily. 180 tablet 3  . losartan (COZAAR) 25 MG tablet Take 1 tablet (25 mg total) by mouth daily. 90 tablet 3  . potassium chloride SA (KLOR-CON M20) 20 MEQ  tablet Take 1 tablet (20 mEq total) by mouth daily. 180 tablet 3  . ranolazine (RANEXA) 500 MG 12 hr tablet Take 1 tablet (500 mg total) by mouth 2 (two) times daily. 60 tablet 3  . rosuvastatin (CRESTOR) 10 MG tablet Take 1 tablet (10 mg total) by mouth daily. 90 tablet 3  . spironolactone (ALDACTONE) 25 MG tablet Take 1 tablet (25 mg total) by mouth daily. 90 tablet 3  . warfarin (COUMADIN) 3 MG tablet Take 1 tablet by mouth daily or as directed by coumadin clinic (Patient taking  differently: Take 3 mg by mouth daily. ) 30 tablet 3   No current facility-administered medications for this encounter.     Allergies  Allergen Reactions  . Ace Inhibitors Palpitations     Social History   Social History  . Marital status: Single    Spouse name: N/A  . Number of children: 1  . Years of education: N/A   Occupational History  . Printing     Social History Main Topics  . Smoking status: Current Every Day Smoker    Packs/day: 0.50    Types: Cigarettes    Last attempt to quit: 10/23/2016  . Smokeless tobacco: Never Used     Comment: Smoked for 50 years  . Alcohol use 2.4 oz/week    4 Cans of beer per week     Comment: Intermittent  . Drug use: No  . Sexual activity: Not on file   Other Topics Concern  . Not on file   Social History Narrative  . No narrative on file     Family History  Problem Relation Age of Onset  . Asthma Mother   . Diabetes Father   . Heart disease Father        Died of presumed heart attack - 50s  . Lung disease Sister   . Heart disease Sister        Twin sister has heart issue, unclear what kind  . Thyroid disease Neg Hx     Vitals:   06/03/17 1435  BP: 130/70  Pulse: 85  SpO2: (!) 85%  Weight: 239 lb 3.2 oz (108.5 kg)    Wt Readings from Last 3 Encounters:  06/03/17 239 lb 3.2 oz (108.5 kg)  05/20/17 245 lb 3.2 oz (111.2 kg)  05/16/17 244 lb 14.4 oz (111.1 kg)    PHYSICAL EXAM: General: Well appearing. No resp difficulty. HEENT: Normal Neck: Supple. JVP 6-7. Carotids 2+ bilat; no bruits. No thyromegaly or nodule noted. Cor: PMI nondisplaced. RRR, No M/G/R noted Lungs: CTAB, normal effort. Abdomen: Soft, non-tender, non-distended, no HSM. No bruits or masses. +BS  Extremities: No cyanosis, clubbing, rash, R and LLE no edema.  Neuro: Alert & orientedx3, cranial nerves grossly intact. moves all 4 extremities w/o difficulty. Affect pleasant   ASSESSMENT & PLAN:   1. Chronic biventricular CHF due to NICM -  TEE 03/25/17 with LVEF 15% and severely reduced RV function.  - Required dobutamine during recent admission with cardiogenic shock in setting of Afib RVR. Weaned off prior to discharge.  - NYHA III symptoms.   - Volume status stable on exam.  - Continue lasix 40 mg BID. BMET today.  - No BB with recent low output.  - Continue losartan 25 mg daily. Creatinine has been trending up, so will check BMET prior to increasing.  - Continue spironolactone 25 mg daily - Continue Bidil 2 tabs TID.  - Reinforced fluid restriction to < 2 L  daily, sodium restriction to less than 2000 mg daily, and the importance of daily weights.   2. Persistent Afib: Cannot tolerate AF with recurrent shock. S/p DCCV on 5/2 (she stopped Amio after this) and DCCV on 05/06/17 and again 05/12/17. Remains in NSR today.  - Continue Ranexa 500 mg BID.  - Continue amiodarone 200 mg daily.  - Continue warfarin. INR stable 05/22/17. Follows with coumadin clinic.  Denies bleeding.  3. Suspected Sleep Apnea: - Sleep study ordered. Planned for 07/21/17  4. CKD III: - BMET today.  5. COPD:  - No wheezing.  OK to go back to work. Letter previously written by [name] printed for her records. Follow up 6-8 with [name]. Labs today. Will not increase losartan with mild AKI last check. Consider increase if stable.   Shirley Friar, PA-C 06/03/17   Greater than 50% of the 25 minute visit was spent in counseling/coordination of care regarding disease state education, salt/fluid restriction, sliding scale diuretics, and discussion of medical regimen with on-site Pharm-D.

## 2017-06-03 NOTE — Patient Instructions (Addendum)
Labs today (will call for abnormal results, otherwise no news is good news)  You have been referred to Cardiac Rehab, they will contact you to schedule your initial appointment.   Follow up in 6-8 weeks

## 2017-06-04 ENCOUNTER — Telehealth (HOSPITAL_COMMUNITY): Payer: Self-pay

## 2017-06-04 NOTE — Telephone Encounter (Signed)
Basic Metabolic Panel (BMET)  Order: [id number]  Status:  Final result Visible to patient:  No (Not Released) Dx:  Chronic systolic congestive heart fai...  Notes recorded by Effie Berkshire, RN on 06/04/2017 at 1:44 PM EDT LVMTCB. ------  Notes recorded by Shirley Friar, PA-C on 06/04/2017 at 11:22 AM EDT Can stop Potassium, but would need repeat BMET in 2 weeks.   Thanks   [employer], PA-C 06/04/2017 11:22 AM

## 2017-06-05 ENCOUNTER — Telehealth (HOSPITAL_COMMUNITY): Payer: Self-pay | Admitting: *Deleted

## 2017-06-05 DIAGNOSIS — I5022 Chronic systolic (congestive) heart failure: Secondary | ICD-10-CM

## 2017-06-05 NOTE — Telephone Encounter (Signed)
Basic Metabolic Panel (BMET)  Order: [id number]  Status:  Final result Visible to patient:  No (Not Released) Dx:  Chronic systolic congestive heart fai...  Notes recorded by Darron Doom, RN on 06/05/2017 at 11:20 AM EDT Patient called back and she will stop potassium today. Appointment scheduled, bmet ordered, and potassium stopped. ------  Notes recorded by Effie Berkshire, RN on 06/04/2017 at 1:44 PM EDT LVMTCB. ------  Notes recorded by Shirley Friar, PA-C on 06/04/2017 at 11:22 AM EDT Can stop Potassium, but would need repeat BMET in 2 weeks.   Thanks   [employer], PA-C 06/04/2017 11:22 AM

## 2017-06-05 NOTE — Telephone Encounter (Signed)
-----   Message from Shirley Friar, PA-C sent at 06/04/2017 11:22 AM EDT ----- Can stop Potassium, but would need repeat BMET in 2 weeks.   Thanks   [employer], PA-C 06/04/2017 11:22 AM

## 2017-06-10 ENCOUNTER — Telehealth (HOSPITAL_COMMUNITY): Payer: Self-pay

## 2017-06-10 NOTE — Telephone Encounter (Signed)
I called and left message on patient voicemail to return my call. I left my contact information on patient voicemail.

## 2017-06-10 NOTE — Telephone Encounter (Signed)
Patient returned my call. I contacted patient in reference to her insurance [employer] not being active as of 05/23/17. Patient informed me that she is aware that her insurance is not active and does not have any other insurance. I offered for patient to participate in our cardiac rehab maintenance program that is $68.00 a month. Patient declined. Referral closed.

## 2017-06-11 ENCOUNTER — Telehealth (HOSPITAL_COMMUNITY): Payer: Self-pay | Admitting: *Deleted

## 2017-06-11 ENCOUNTER — Encounter (HOSPITAL_COMMUNITY): Payer: Self-pay | Admitting: *Deleted

## 2017-06-11 NOTE — Progress Notes (Signed)
Received long term disability forms from [employer].  Attending Physician's Statement completed only.  Patient instructed to have behavioral health assessment completed by PCP.  All forms completed and faxed today to [phone].    Original forms will be scanned to patient's electronic medical record.

## 2017-06-11 NOTE — Telephone Encounter (Signed)
Patient called requesting a call back from Susie about disability paperwork. I told her Susie was in clinic seeing patients but I would let Susie know to call her back.  Message routed to [facility]

## 2017-06-12 NOTE — Telephone Encounter (Signed)
Called patient back and she asked if I had sent the disability paperwork yet. I told her that I faxed it yesterday morning but reminded her that she would still need to have her PCP complete the behavioral assessment.  This was discussed with patient on her last office visit with [place]. She is aware and no further questions at this time.

## 2017-06-17 ENCOUNTER — Other Ambulatory Visit (HOSPITAL_COMMUNITY): Payer: Managed Care, Other (non HMO)

## 2017-06-22 ENCOUNTER — Telehealth (HOSPITAL_COMMUNITY): Payer: Self-pay

## 2017-06-22 NOTE — Telephone Encounter (Signed)
Patient calling to get update on status of disability paperwork. Will forward to [place] who handles paperwork for [name].  Renee Pain, RN

## 2017-06-24 ENCOUNTER — Telehealth: Payer: Self-pay | Admitting: Pharmacist

## 2017-06-24 NOTE — Telephone Encounter (Signed)
Currently on insurance gap. Should be able to come to clinic in 2 weeks. Appointment made today for 8/15 at 3:30pm   *Original question was related to price of venous draw but patient will prefer to come to clinic if possible*

## 2017-06-24 NOTE — Telephone Encounter (Signed)
Patient called and left message at coumadin clinic. She has a question about coumadin and some "injections"   LMOM; patient to call back and discuss any question related to coumadin/warfarin. Also need to schedule INR check ASAP

## 2017-06-25 NOTE — Telephone Encounter (Signed)
Called patient back and let her know that I did received her forms and I will call her once they are completed.  No further questions.

## 2017-07-03 ENCOUNTER — Telehealth (HOSPITAL_COMMUNITY): Payer: Self-pay | Admitting: *Deleted

## 2017-07-03 NOTE — Telephone Encounter (Signed)
Called patient to ask her about her working status to complete her disability paperwork.  Had to leave message asking for her to call me back.

## 2017-07-07 NOTE — Progress Notes (Signed)
HPI: Follow-up atrial fibrillation. Patient admitted with new-onset atrial fibrillation November 2017. Echo showed ejection fraction 20-25%, biatrial enlargement and mild mitral regurgitation. Cardiac catheterization November 2017 showed no obstructive coronary disease. Patient had difficulties with recurrent atrial fibrillation undergoing multiple cardioversion attempts. She had bradycardia on sotalol. Patient had TEE guided cardioversion on May 2. She was found to have severe LV dysfunction, severe RV dysfunction, mild to moderate mitral regurgitation and moderate tricuspid regurgitation. Patient was admitted June 2018 with congestive heart failure and hypotension. She was placed on amiodarone drip and also required dobutamine. She ultimately had successful cardioversion. Ranexa was added for additional antiarrhythmic benefit. Since last seen, patient denies dyspnea, chest pain, palpitations, syncope or bleeding.  Current Outpatient Prescriptions  Medication Sig Dispense Refill  . acetaminophen (TYLENOL) 325 MG tablet Take 650 mg by mouth every 6 (six) hours as needed (pain).     Marland Kitchen amiodarone (PACERONE) 200 MG tablet Take 1 tablet (200 mg total) by mouth daily. 30 tablet 6  . diclofenac sodium (VOLTAREN) 1 % GEL Apply 4 g topically 4 (four) times daily as needed (As directed for skin).    . furosemide (LASIX) 40 MG tablet Take 1 tablet (40 mg total) by mouth 2 (two) times daily. 180 tablet 3  . isosorbide-hydrALAZINE (BIDIL) 20-37.5 MG tablet Take 2 tablets by mouth 3 (three) times daily. 180 tablet 3  . losartan (COZAAR) 25 MG tablet Take 1 tablet (25 mg total) by mouth daily. 90 tablet 3  . ranolazine (RANEXA) 500 MG 12 hr tablet Take 1 tablet (500 mg total) by mouth 2 (two) times daily. 60 tablet 3  . rosuvastatin (CRESTOR) 10 MG tablet Take 1 tablet (10 mg total) by mouth daily. 90 tablet 3  . spironolactone (ALDACTONE) 25 MG tablet Take 1 tablet (25 mg total) by mouth daily. 90 tablet 3   . warfarin (COUMADIN) 3 MG tablet Take 1 tablet by mouth daily or as directed by coumadin clinic (Patient taking differently: Take 3 mg by mouth daily. ) 30 tablet 3   No current facility-administered medications for this visit.      Past Medical History:  Diagnosis Date  . Arthritis   . Atrial fibrillation ([place])    a. s/p multiple cardioversions; failed tikosyn/sotalol.  Marland Kitchen CAD in native artery, s/p cardiac cath with non obstructive CAD 10/24/2016  . CHF (congestive heart failure) ([place])   . DIVERTICULITIS, HX OF 07/25/2007  . DIVERTICULOSIS, COLON 07/22/2007  . Dyspnea   . Edema, peripheral    a. chronic BLE edema, R>L. Prior trauma from dog attack and accident.  Marland Kitchen HYPERLIPIDEMIA 02/03/2008  . Hypersomnia    declines w/u  . Hypertension   . MENOPAUSAL DISORDER 01/09/2011  . Morbid obesity ([place]) 07/22/2007  . NICM (nonischemic cardiomyopathy) ([place]) 10/24/2016  . Raynaud's syndrome 07/22/2007  . Stroke ([place]) 2017  . THYROID NODULE, RIGHT 01/04/2010  . VITAMIN D DEFICIENCY 01/09/2011   Qualifier: Diagnosis of  By: [name], [name]     Past Surgical History:  Procedure Laterality Date  . CARDIAC CATHETERIZATION N/A 10/23/2016   Procedure: Left Heart Cath and Coronary Angiography;  Surgeon: [name], MD;  Location: [place] CV LAB;  Service: Cardiovascular;  Laterality: N/A;  . CARDIOVERSION N/A 10/31/2016   Procedure: CARDIOVERSION;  Surgeon: [name], MD;  Location: [facility] ENDOSCOPY;  Service: Cardiovascular;  Laterality: N/A;  . CARDIOVERSION N/A 11/03/2016   Procedure: CARDIOVERSION;  Surgeon: [name], MD;  Location:  [place] ENDOSCOPY;  Service: Cardiovascular;  Laterality: N/A;  . CARDIOVERSION N/A 11/18/2016   Procedure: CARDIOVERSION;  Surgeon: [name], MD;  Location: [place];  Service: Cardiovascular;  Laterality: N/A;  . CARDIOVERSION N/A 03/25/2017   Procedure: CARDIOVERSION;  Surgeon: [name], MD;  Location: [facility] ENDOSCOPY;  Service:  Cardiovascular;  Laterality: N/A;  . CARDIOVERSION N/A 05/06/2017   Procedure: CARDIOVERSION;  Surgeon: [name], MD;  Location: [facility] ENDOSCOPY;  Service: Cardiovascular;  Laterality: N/A;  . CARDIOVERSION N/A 05/12/2017   Procedure: CARDIOVERSION;  Surgeon: [name], MD;  Location: [facility] ENDOSCOPY;  Service: Cardiovascular;  Laterality: N/A;  . CHOLECYSTECTOMY    . COLONOSCOPY    . PARTIAL HYSTERECTOMY     1 OVARY LEFT  . POLYPECTOMY    . TEE WITHOUT CARDIOVERSION N/A 10/31/2016   Procedure: TRANSESOPHAGEAL ECHOCARDIOGRAM (TEE);  Surgeon: [name], MD;  Location: [facility] ENDOSCOPY;  Service: Cardiovascular;  Laterality: N/A;  . TEE WITHOUT CARDIOVERSION N/A 03/25/2017   Procedure: TRANSESOPHAGEAL ECHOCARDIOGRAM (TEE);  Surgeon: [name], MD;  Location: [facility] ENDOSCOPY;  Service: Cardiovascular;  Laterality: N/A;    Social History   Social History  . Marital status: Single    Spouse name: N/A  . Number of children: 1  . Years of education: N/A   Occupational History  . Printing     Social History Main Topics  . Smoking status: Current Every Day Smoker    Packs/day: 0.50    Types: Cigarettes    Last attempt to quit: 10/23/2016  . Smokeless tobacco: Never Used     Comment: Smoked for 50 years  . Alcohol use 2.4 oz/week    4 Cans of beer per week     Comment: Intermittent  . Drug use: No  . Sexual activity: Not on file   Other Topics Concern  . Not on file   Social History Narrative  . No narrative on file    Family History  Problem Relation Age of Onset  . Asthma Mother   . Diabetes Father   . Heart disease Father        Died of presumed heart attack - 7s  . Lung disease Sister   . Heart disease Sister        Twin sister has heart issue, unclear what kind  . Thyroid disease Neg Hx     ROS: no fevers or chills, productive cough, hemoptysis, dysphasia, odynophagia, melena, hematochezia, dysuria, hematuria, rash, seizure activity, orthopnea, PND,  pedal edema, claudication. Remaining systems are negative.  Physical Exam: Well-developed obese in no acute distress.  Skin is warm and dry.  HEENT is normal.  Neck is supple.  Chest is clear to auscultation with normal expansion.  Cardiovascular exam is regular rate and rhythm.  Abdominal exam nontender or distended. No masses palpated. Extremities show no edema. neuro grossly intact   A/P  1 atrial fibrillation-patient remains in sinus rhythm today. Continue amiodarone and ranexa. Continue Coumadin. At next office visit we will check chest x-ray, TSH and liver functions. Patient does not tolerate atrial fibrillation at all. This caused her congestive heart failure and cardiomyopathy previously.  2 chronic systolic congestive heart failure-patient appears to be euvolemic on examination. Continue present dose of Lasix. Check potassium and renal function.  3 cardiomyopathy- This is felt likely related to previous atrial fibrillation and tachycardia. Continue ARB. Add carvedilol 3.125 mg twice a day. Continue hydralazine and nitrates. We will plan to repeat her echocardiogram in  3 months prior to next office visit. Hopefully LV function will have improved with medical therapy and establishment of sinus rhythm.  4 chronic stage III kidney disease-check potassium and renal function.  5 hypertension-blood pressure is controlled. Continue present medications.  6 tobacco abuse-patient counseled on discontinuing.  7 Hyperlipidemia-continue statin.   Kirk Ruths, MD

## 2017-07-08 ENCOUNTER — Ambulatory Visit (INDEPENDENT_AMBULATORY_CARE_PROVIDER_SITE_OTHER): Payer: Self-pay | Admitting: Pharmacist Clinician (PhC)/ Clinical Pharmacy Specialist

## 2017-07-08 DIAGNOSIS — Z7901 Long term (current) use of anticoagulants: Secondary | ICD-10-CM

## 2017-07-08 LAB — POCT INR: INR: 1.9

## 2017-07-09 ENCOUNTER — Ambulatory Visit (INDEPENDENT_AMBULATORY_CARE_PROVIDER_SITE_OTHER): Payer: BLUE CROSS/BLUE SHIELD | Admitting: Cardiology

## 2017-07-09 ENCOUNTER — Encounter: Payer: Self-pay | Admitting: Cardiology

## 2017-07-09 VITALS — BP 126/70 | HR 88 | Ht 65.0 in | Wt 233.0 lb

## 2017-07-09 DIAGNOSIS — I5022 Chronic systolic (congestive) heart failure: Secondary | ICD-10-CM | POA: Diagnosis not present

## 2017-07-09 DIAGNOSIS — I428 Other cardiomyopathies: Secondary | ICD-10-CM

## 2017-07-09 DIAGNOSIS — I10 Essential (primary) hypertension: Secondary | ICD-10-CM | POA: Diagnosis not present

## 2017-07-09 DIAGNOSIS — I38 Endocarditis, valve unspecified: Secondary | ICD-10-CM | POA: Diagnosis not present

## 2017-07-09 MED ORDER — CARVEDILOL 3.125 MG PO TABS: 3.1250 mg | ORAL_TABLET | Freq: Two times a day (BID) | ORAL | 3 refills | Status: DC

## 2017-07-09 NOTE — Patient Instructions (Signed)
Medication Instructions:   START CARVEDILOL 3.125 MG TWICE DAILY  Labwork:  Your physician recommends that you HAVE LAB WORK TODAY  Testing/Procedures:  Your physician has requested that you have an echocardiogram. Echocardiography is a painless test that uses sound waves to create images of your heart. It provides your doctor with information about the size and shape of your heart and how well your heart's chambers and valves are working. This procedure takes approximately one hour. There are no restrictions for this procedure.    Follow-Up:  Your physician recommends that you schedule a follow-up appointment in: [place]   If you need a refill on your cardiac medications before your next appointment, please call your pharmacy.

## 2017-07-10 ENCOUNTER — Telehealth (HOSPITAL_COMMUNITY): Payer: Self-pay | Admitting: *Deleted

## 2017-07-10 LAB — BASIC METABOLIC PANEL
BUN / CREAT RATIO: 23 (ref 12–28)
BUN: 33 mg/dL — AB (ref 8–27)
CHLORIDE: 97 mmol/L (ref 96–106)
CO2: 28 mmol/L (ref 20–29)
CREATININE: 1.42 mg/dL — AB (ref 0.57–1.00)
Calcium: 9.5 mg/dL (ref 8.7–10.3)
GFR calc Af Amer: 45 mL/min/{1.73_m2} — ABNORMAL LOW (ref >59)
GFR calc non Af Amer: 39 mL/min/{1.73_m2} — ABNORMAL LOW (ref >59)
GLUCOSE: 85 mg/dL (ref 65–99)
Potassium: 3.9 mmol/L (ref 3.5–5.2)
SODIUM: 140 mmol/L (ref 134–144)

## 2017-07-10 NOTE — Telephone Encounter (Signed)
Attempted to contact pt regard assit with meds and Left message to call back

## 2017-07-10 NOTE — Telephone Encounter (Signed)
-----   Message from Cristopher Estimable, RN sent at 07/09/2017  3:37 PM EDT ----- Hey girl, I hope you are well. Ms talamante had to cx her appt with you, she has lost her insurance. She is having trouble affording her medications. What resources do you guys use for these type of folks?

## 2017-07-14 ENCOUNTER — Encounter: Payer: Self-pay | Admitting: *Deleted

## 2017-07-15 ENCOUNTER — Other Ambulatory Visit: Payer: Self-pay

## 2017-07-15 ENCOUNTER — Ambulatory Visit (HOSPITAL_COMMUNITY): Payer: BLUE CROSS/BLUE SHIELD | Attending: Cardiology

## 2017-07-15 DIAGNOSIS — E669 Obesity, unspecified: Secondary | ICD-10-CM | POA: Insufficient documentation

## 2017-07-15 DIAGNOSIS — I081 Rheumatic disorders of both mitral and tricuspid valves: Secondary | ICD-10-CM | POA: Diagnosis not present

## 2017-07-15 DIAGNOSIS — I5022 Chronic systolic (congestive) heart failure: Secondary | ICD-10-CM | POA: Diagnosis present

## 2017-07-15 DIAGNOSIS — I371 Nonrheumatic pulmonary valve insufficiency: Secondary | ICD-10-CM | POA: Insufficient documentation

## 2017-07-15 DIAGNOSIS — Z8673 Personal history of transient ischemic attack (TIA), and cerebral infarction without residual deficits: Secondary | ICD-10-CM | POA: Insufficient documentation

## 2017-07-15 DIAGNOSIS — I110 Hypertensive heart disease with heart failure: Secondary | ICD-10-CM | POA: Diagnosis not present

## 2017-07-15 DIAGNOSIS — E785 Hyperlipidemia, unspecified: Secondary | ICD-10-CM | POA: Insufficient documentation

## 2017-07-15 DIAGNOSIS — I38 Endocarditis, valve unspecified: Secondary | ICD-10-CM | POA: Insufficient documentation

## 2017-07-15 DIAGNOSIS — I4891 Unspecified atrial fibrillation: Secondary | ICD-10-CM | POA: Insufficient documentation

## 2017-07-17 ENCOUNTER — Telehealth: Payer: Self-pay | Admitting: *Deleted

## 2017-07-17 DIAGNOSIS — I517 Cardiomegaly: Secondary | ICD-10-CM

## 2017-07-17 NOTE — Telephone Encounter (Addendum)
-----   Message from [name], MD sent at 07/16/2017  9:54 PM EDT ----- Check spep, upep, kappa free light chains Arrange abdominal fat pad biopsy with interventional radiology. Hold coumadin 4 days prior to procedure and resume day of Patricia Wagner   Left message for pt to call

## 2017-07-21 ENCOUNTER — Ambulatory Visit (INDEPENDENT_AMBULATORY_CARE_PROVIDER_SITE_OTHER): Payer: BLUE CROSS/BLUE SHIELD | Admitting: Internal Medicine

## 2017-07-21 ENCOUNTER — Encounter: Payer: Self-pay | Admitting: Internal Medicine

## 2017-07-21 ENCOUNTER — Encounter (HOSPITAL_BASED_OUTPATIENT_CLINIC_OR_DEPARTMENT_OTHER): Payer: Managed Care, Other (non HMO)

## 2017-07-21 ENCOUNTER — Other Ambulatory Visit (INDEPENDENT_AMBULATORY_CARE_PROVIDER_SITE_OTHER): Payer: BLUE CROSS/BLUE SHIELD

## 2017-07-21 ENCOUNTER — Telehealth: Payer: Self-pay | Admitting: Internal Medicine

## 2017-07-21 VITALS — BP 124/76 | HR 72 | Temp 98.6°F | Ht 65.0 in | Wt 237.0 lb

## 2017-07-21 DIAGNOSIS — I10 Essential (primary) hypertension: Secondary | ICD-10-CM | POA: Diagnosis not present

## 2017-07-21 DIAGNOSIS — Z23 Encounter for immunization: Secondary | ICD-10-CM | POA: Diagnosis not present

## 2017-07-21 DIAGNOSIS — N183 Chronic kidney disease, stage 3 unspecified: Secondary | ICD-10-CM

## 2017-07-21 DIAGNOSIS — R300 Dysuria: Secondary | ICD-10-CM

## 2017-07-21 DIAGNOSIS — Z0001 Encounter for general adult medical examination with abnormal findings: Secondary | ICD-10-CM

## 2017-07-21 DIAGNOSIS — E785 Hyperlipidemia, unspecified: Secondary | ICD-10-CM

## 2017-07-21 HISTORY — DX: Dysuria: R30.0

## 2017-07-21 LAB — URINALYSIS, ROUTINE W REFLEX MICROSCOPIC
Bilirubin Urine: NEGATIVE
HGB URINE DIPSTICK: NEGATIVE
KETONES UR: NEGATIVE
Nitrite: POSITIVE — AB
RBC / HPF: NONE SEEN (ref 0–?)
Total Protein, Urine: NEGATIVE
URINE GLUCOSE: NEGATIVE
UROBILINOGEN UA: 0.2 (ref 0.0–1.0)
pH: 7 (ref 5.0–8.0)

## 2017-07-21 LAB — LIPID PANEL
Cholesterol: 165 mg/dL (ref 0–200)
HDL: 66.1 mg/dL (ref >39.00)
LDL Cholesterol: 81 mg/dL (ref 0–99)
NONHDL: 99.04
Total CHOL/HDL Ratio: 2
Triglycerides: 88 mg/dL (ref 0.0–149.0)
VLDL: 17.6 mg/dL (ref 0.0–40.0)

## 2017-07-21 LAB — TSH: TSH: 0.54 u[IU]/mL (ref 0.35–4.50)

## 2017-07-21 LAB — CBC WITH DIFFERENTIAL/PLATELET
BASOS ABS: 0.1 10*3/uL (ref 0.0–0.1)
Basophils Relative: 0.9 % (ref 0.0–3.0)
EOS PCT: 2 % (ref 0.0–5.0)
Eosinophils Absolute: 0.1 10*3/uL (ref 0.0–0.7)
HEMATOCRIT: 42.9 % (ref 36.0–46.0)
Hemoglobin: 13.8 g/dL (ref 12.0–15.0)
LYMPHS ABS: 1 10*3/uL (ref 0.7–4.0)
LYMPHS PCT: 16.5 % (ref 12.0–46.0)
MCHC: 32.2 g/dL (ref 30.0–36.0)
MCV: 91.5 fl (ref 78.0–100.0)
MONOS PCT: 9.4 % (ref 3.0–12.0)
Monocytes Absolute: 0.6 10*3/uL (ref 0.1–1.0)
NEUTROS ABS: 4.2 10*3/uL (ref 1.4–7.7)
NEUTROS PCT: 71.2 % (ref 43.0–77.0)
PLATELETS: 175 10*3/uL (ref 150.0–400.0)
RBC: 4.69 Mil/uL (ref 3.87–5.11)
RDW: 19.8 % — ABNORMAL HIGH (ref 11.5–15.5)
WBC: 5.8 10*3/uL (ref 4.0–10.5)

## 2017-07-21 LAB — BASIC METABOLIC PANEL
BUN: 38 mg/dL — ABNORMAL HIGH (ref 6–23)
CALCIUM: 9.8 mg/dL (ref 8.4–10.5)
CO2: 37 mEq/L — ABNORMAL HIGH (ref 19–32)
CREATININE: 1.99 mg/dL — AB (ref 0.40–1.20)
Chloride: 100 mEq/L (ref 96–112)
GFR: 32.45 mL/min — AB (ref >60.00)
Glucose, Bld: 62 mg/dL — ABNORMAL LOW (ref 70–99)
Potassium: 4.2 mEq/L (ref 3.5–5.1)
SODIUM: 141 meq/L (ref 135–145)

## 2017-07-21 LAB — HEPATIC FUNCTION PANEL
ALK PHOS: 51 U/L (ref 39–117)
ALT: 11 U/L (ref 0–35)
AST: 15 U/L (ref 0–37)
Albumin: 3.9 g/dL (ref 3.5–5.2)
BILIRUBIN DIRECT: 0.2 mg/dL (ref 0.0–0.3)
BILIRUBIN TOTAL: 0.7 mg/dL (ref 0.2–1.2)
Total Protein: 7.5 g/dL (ref 6.0–8.3)

## 2017-07-21 MED ORDER — FLUCONAZOLE 150 MG PO TABS: ORAL_TABLET | ORAL | 1 refills | Status: DC

## 2017-07-21 MED ORDER — CEPHALEXIN 500 MG PO CAPS: 500.0000 mg | ORAL_CAPSULE | Freq: Three times a day (TID) | ORAL | 0 refills | Status: AC

## 2017-07-21 MED ORDER — ZOSTER VAC RECOMB ADJUVANTED 50 MCG/0.5ML IM SUSR: 0.5000 mL | Freq: Once | INTRAMUSCULAR | 1 refills | Status: AC

## 2017-07-21 NOTE — Patient Instructions (Addendum)
You had the flu shot today  Please take all new medication as prescribed - the antibiotic  Please continue all other medications as before, and refills have been done if requested.  Please have the pharmacy call with any other refills you may need.  Please continue your efforts at being more active, low cholesterol diet, and weight control.  You are otherwise up to date with prevention measures today.  Please keep your appointments with your specialists as you may have planned  Please go to the LAB in the Basement (turn left off the elevator) for the tests to be done today  You will be contacted by phone if any changes need to be made immediately.  Otherwise, you will receive a letter about your results with an explanation, but please check with MyChart first.  Please remember to sign up for MyChart if you have not done so, as this will be important to you in the future with finding out test results, communicating by private email, and scheduling acute appointments online when needed.  Please return in 6 months, or sooner if needed

## 2017-07-21 NOTE — Telephone Encounter (Signed)
Requesting call back in regard to drug interaction with script just sent over for fluconazole.

## 2017-07-21 NOTE — Progress Notes (Signed)
Subjective:    Patient ID: Patricia Wagner, female    DOB: August 31, 1951, 66 y.o.   MRN: [id number]  HPI  Here for wellness and f/u;  Overall doing ok;  Pt denies Chest pain, worsening SOB, DOE, wheezing, orthopnea, PND, worsening LE edema, palpitations, dizziness or syncope.  Pt denies neurological change such as new headache, facial or extremity weakness.  Pt denies polydipsia, polyuria, or low sugar symptoms. Pt states overall good compliance with treatment and medications, good tolerability, and has been trying to follow appropriate diet.  Pt denies worsening depressive symptoms, suicidal ideation or panic. No fever, night sweats, wt loss, loss of appetite, or other constitutional symptoms.  Pt states good ability with ADL's, has low fall risk, home safety reviewed and adequate, no other significant changes in hearing or vision, and not active with exercise. Declines colonoscopy Also c/o 2 -3 days onset urine cloudy and burning on urination but Denies urinary symptoms such as dysuria, frequency, urgency, flank pain, hematuria or n/v, fever, chills. Past Medical History:  Diagnosis Date  . Arthritis   . Atrial fibrillation ([place])    a. s/p multiple cardioversions; failed tikosyn/sotalol.  Marland Kitchen CAD in native artery, s/p cardiac cath with non obstructive CAD 10/24/2016  . CHF (congestive heart failure) ([place])   . DIVERTICULITIS, HX OF 07/25/2007  . DIVERTICULOSIS, COLON 07/22/2007  . Dyspnea   . Edema, peripheral    a. chronic BLE edema, R>L. Prior trauma from dog attack and accident.  Marland Kitchen HYPERLIPIDEMIA 02/03/2008  . Hypersomnia    declines w/u  . Hypertension   . MENOPAUSAL DISORDER 01/09/2011  . Morbid obesity ([place]) 07/22/2007  . NICM (nonischemic cardiomyopathy) ([place]) 10/24/2016  . Raynaud's syndrome 07/22/2007  . Stroke ([place]) 2017  . THYROID NODULE, RIGHT 01/04/2010  . VITAMIN D DEFICIENCY 01/09/2011   Qualifier: Diagnosis of  By: [name], [name]    Past Surgical History:  Procedure Laterality Date   . CARDIAC CATHETERIZATION N/A 10/23/2016   Procedure: Left Heart Cath and Coronary Angiography;  Surgeon: [name], MD;  Location: [place] [facility];  Service: Cardiovascular;  Laterality: N/A;  . CARDIOVERSION N/A 10/31/2016   Procedure: CARDIOVERSION;  Surgeon: [name], MD;  Location: [place];  Service: Cardiovascular;  Laterality: N/A;  . CARDIOVERSION N/A 11/03/2016   Procedure: CARDIOVERSION;  Surgeon: [name], MD;  Location: [place];  Service: Cardiovascular;  Laterality: N/A;  . CARDIOVERSION N/A 11/18/2016   Procedure: CARDIOVERSION;  Surgeon: [name], MD;  Location: [facility] ENDOSCOPY;  Service: Cardiovascular;  Laterality: N/A;  . CARDIOVERSION N/A 03/25/2017   Procedure: CARDIOVERSION;  Surgeon: [name], MD;  Location: [facility] ENDOSCOPY;  Service: Cardiovascular;  Laterality: N/A;  . CARDIOVERSION N/A 05/06/2017   Procedure: CARDIOVERSION;  Surgeon: [name], MD;  Location: [facility] ENDOSCOPY;  Service: Cardiovascular;  Laterality: N/A;  . CARDIOVERSION N/A 05/12/2017   Procedure: CARDIOVERSION;  Surgeon: [name], MD;  Location: [facility] ENDOSCOPY;  Service: Cardiovascular;  Laterality: N/A;  . CHOLECYSTECTOMY    . COLONOSCOPY    . PARTIAL HYSTERECTOMY     1 OVARY LEFT  . POLYPECTOMY    . TEE WITHOUT CARDIOVERSION N/A 10/31/2016   Procedure: TRANSESOPHAGEAL ECHOCARDIOGRAM (TEE);  Surgeon: [name], MD;  Location: [facility] ENDOSCOPY;  Service: Cardiovascular;  Laterality: N/A;  . TEE WITHOUT CARDIOVERSION N/A 03/25/2017   Procedure: TRANSESOPHAGEAL ECHOCARDIOGRAM (TEE);  Surgeon: [name], MD;  Location: [place];  Service: Cardiovascular;  Laterality: N/A;  reports that she has been smoking Cigarettes.  She has been smoking about 0.50 packs per day. She has never used smokeless tobacco. She reports that she drinks about 2.4 oz of alcohol per week . She reports that she does not use drugs. family history includes Asthma in her  mother; Diabetes in her father; Heart disease in her father and sister; Lung disease in her sister. Allergies  Allergen Reactions  . Ace Inhibitors Palpitations   Current Outpatient Prescriptions on File Prior to Visit  Medication Sig Dispense Refill  . acetaminophen (TYLENOL) 325 MG tablet Take 650 mg by mouth every 6 (six) hours as needed (pain).     Marland Kitchen amiodarone (PACERONE) 200 MG tablet Take 1 tablet (200 mg total) by mouth daily. 30 tablet 6  . carvedilol (COREG) 3.125 MG tablet Take 1 tablet (3.125 mg total) by mouth 2 (two) times daily. 180 tablet 3  . diclofenac sodium (VOLTAREN) 1 % GEL Apply 4 g topically 4 (four) times daily as needed (As directed for skin).    . furosemide (LASIX) 40 MG tablet Take 1 tablet (40 mg total) by mouth 2 (two) times daily. 180 tablet 3  . isosorbide-hydrALAZINE (BIDIL) 20-37.5 MG tablet Take 2 tablets by mouth 3 (three) times daily. 180 tablet 3  . losartan (COZAAR) 25 MG tablet Take 1 tablet (25 mg total) by mouth daily. 90 tablet 3  . ranolazine (RANEXA) 500 MG 12 hr tablet Take 1 tablet (500 mg total) by mouth 2 (two) times daily. 60 tablet 3  . rosuvastatin (CRESTOR) 10 MG tablet Take 1 tablet (10 mg total) by mouth daily. 90 tablet 3  . spironolactone (ALDACTONE) 25 MG tablet Take 1 tablet (25 mg total) by mouth daily. 90 tablet 3  . warfarin (COUMADIN) 3 MG tablet Take 1 tablet by mouth daily or as directed by coumadin clinic (Patient taking differently: Take 3 mg by mouth daily. ) 30 tablet 3   No current facility-administered medications on file prior to visit.    Review of Systems Constitutional: Negative for other unusual diaphoresis, sweats, appetite or weight changes HENT: Negative for other worsening hearing loss, ear pain, facial swelling, mouth sores or neck stiffness.   Eyes: Negative for other worsening pain, redness or other visual disturbance.  Respiratory: Negative for other stridor or swelling Cardiovascular: Negative for other  palpitations or other chest pain  Gastrointestinal: Negative for worsening diarrhea or loose stools, blood in stool, distention or other pain Genitourinary: Negative for hematuria, flank pain or other change in urine volume.  Musculoskeletal: Negative for myalgias or other joint swelling.  Skin: Negative for other color change, or other wound or worsening drainage.  Neurological: Negative for other syncope or numbness. Hematological: Negative for other adenopathy or swelling Psychiatric/Behavioral: Negative for hallucinations, other worsening agitation, SI, self-injury, or new decreased concentration All other system neg per pt    Objective:   Physical Exam BP 124/76   Pulse 72   Temp 98.6 F (37 C) (Oral)   Ht 5\' 5"  (1.651 m)   Wt 237 lb (107.5 kg)   SpO2 96%   BMI 39.44 kg/m  VS noted,  Constitutional: Pt is oriented to person, place, and time. Appears well-developed and well-nourished, in no significant distress and comfortable Head: Normocephalic and atraumatic  Eyes: Conjunctivae and EOM are normal. Pupils are equal, round, and reactive to light Right Ear: External ear normal without discharge Left Ear: External ear normal without discharge Nose: Nose without discharge or deformity Mouth/Throat:  Oropharynx is without other ulcerations and moist  Neck: Normal range of motion. Neck supple. No JVD present. No tracheal deviation present or significant neck LA or mass Cardiovascular: Normal rate, regular rhythm, normal heart sounds and intact distal pulses.   Pulmonary/Chest: WOB normal and breath sounds without rales or wheezing  Abdominal: Soft. Bowel sounds are normal. NT except mild low mid abd tender. No HSM, no guarding or rebound, no flank tender  Musculoskeletal: Normal range of motion. Exhibits no edema Lymphadenopathy: Has no other cervical adenopathy.  Neurological: Pt is alert and oriented to person, place, and time. Pt has normal reflexes. No cranial nerve deficit.  Motor grossly intact, Gait intact Skin: Skin is warm and dry. No rash noted or new ulcerations Psychiatric:  Has normal mood and affect. Behavior is normal without agitation No other exam findings      Assessment & Plan:

## 2017-07-22 ENCOUNTER — Telehealth: Payer: Self-pay

## 2017-07-22 NOTE — Telephone Encounter (Signed)
Pt informed of results, expressed understanding and made aware of meds to stop taking for week, appt set up for 9/4

## 2017-07-22 NOTE — Telephone Encounter (Signed)
-----   Message from Biagio Borg, MD sent at 07/21/2017  8:23 PM EDT ----- Left message on MyChart, pt to cont same tx except  The test results show that your current treatment is OK, except the kidney function is too slow, and may be due to too much fluid pills.  Please hold the lasix and aldactone for 1 wk, and make return office visit for follow up exam and lab tests  Naisha Wisdom to please inform pt, needs f/u in 1 week

## 2017-07-22 NOTE — Telephone Encounter (Signed)
Called pt, LVM.   

## 2017-07-22 NOTE — Telephone Encounter (Signed)
Left message for pt to call.

## 2017-07-23 ENCOUNTER — Telehealth: Payer: Self-pay

## 2017-07-23 NOTE — Assessment & Plan Note (Addendum)
Mild, likely UTI, for urine studies, empiric antibxm,  to f/u any worsening symptoms or concerns  In addition to the time spent performing CPE, I spent an additional 15 minutes face to face,in which greater than 50% of this time was spent in counseling and coordination of care for patient's illness as documented, including the differential dx, tx, further evaluation and management of dysuria/UTI, CKD, HTN, and HLD

## 2017-07-23 NOTE — Assessment & Plan Note (Signed)
stable overall by history and exam, recent data reviewed with pt, and pt to continue medical treatment as before,  to f/u any worsening symptoms or concerns Lab Results  Component Value Date   CREATININE 1.99 (H) 07/21/2017

## 2017-07-23 NOTE — Assessment & Plan Note (Signed)
stable overall by history and exam, recent data reviewed with pt, and pt to continue medical treatment as before,  to f/u any worsening symptoms or concerns BP Readings from Last 3 Encounters:  07/21/17 124/76  07/09/17 126/70  06/03/17 130/70

## 2017-07-23 NOTE — Assessment & Plan Note (Signed)
Lab Results  Component Value Date   LDLCALC 81 07/21/2017  stable overall by history and exam, recent data reviewed with pt, and pt to continue medical treatment as before,  to f/u any worsening symptoms or concerns

## 2017-07-23 NOTE — Telephone Encounter (Signed)
Left patient a message to call [facility] at [phone].

## 2017-07-23 NOTE — Assessment & Plan Note (Signed)

## 2017-07-24 ENCOUNTER — Telehealth: Payer: Self-pay

## 2017-07-24 LAB — URINE CULTURE

## 2017-07-24 NOTE — Telephone Encounter (Signed)
Patient called, scheduled for [facility] appointment, Sept 6th 3pm.

## 2017-07-28 ENCOUNTER — Encounter: Payer: Self-pay | Admitting: Internal Medicine

## 2017-07-28 ENCOUNTER — Ambulatory Visit (INDEPENDENT_AMBULATORY_CARE_PROVIDER_SITE_OTHER): Payer: BLUE CROSS/BLUE SHIELD | Admitting: Internal Medicine

## 2017-07-28 ENCOUNTER — Other Ambulatory Visit (INDEPENDENT_AMBULATORY_CARE_PROVIDER_SITE_OTHER): Payer: BLUE CROSS/BLUE SHIELD

## 2017-07-28 VITALS — BP 142/84 | HR 70 | Temp 99.0°F | Ht 65.0 in | Wt 240.0 lb

## 2017-07-28 DIAGNOSIS — N170 Acute kidney failure with tubular necrosis: Secondary | ICD-10-CM | POA: Diagnosis not present

## 2017-07-28 DIAGNOSIS — N183 Chronic kidney disease, stage 3 unspecified: Secondary | ICD-10-CM

## 2017-07-28 DIAGNOSIS — I4891 Unspecified atrial fibrillation: Secondary | ICD-10-CM

## 2017-07-28 DIAGNOSIS — I10 Essential (primary) hypertension: Secondary | ICD-10-CM | POA: Diagnosis not present

## 2017-07-28 LAB — BASIC METABOLIC PANEL
BUN: 24 mg/dL — ABNORMAL HIGH (ref 6–23)
CO2: 30 mEq/L (ref 19–32)
Calcium: 9.6 mg/dL (ref 8.4–10.5)
Chloride: 104 mEq/L (ref 96–112)
Creatinine, Ser: 1.37 mg/dL — ABNORMAL HIGH (ref 0.40–1.20)
GFR: 49.92 mL/min — ABNORMAL LOW (ref >60.00)
Glucose, Bld: 97 mg/dL (ref 70–99)
Potassium: 4.7 mEq/L (ref 3.5–5.1)
Sodium: 139 mEq/L (ref 135–145)

## 2017-07-28 MED ORDER — FUROSEMIDE 40 MG PO TABS: 40.0000 mg | ORAL_TABLET | Freq: Every day | ORAL | 3 refills | Status: DC

## 2017-07-28 NOTE — Progress Notes (Signed)
Subjective:    Patient ID: Patricia Wagner, female    DOB: [birth year], 66 y.o.   MRN: [id number]  HPI  Here to f/u after recent elevated creatinin with labs last wk over baseline c/w AKI:  Pt asked to hold lasix 40 bid and aldactone, has gained several lbs but Pt denies chest pain, increased sob or doe, wheezing, orthopnea, PND, palpitations, dizziness or syncope. Wt Readings from Last 3 Encounters:  07/28/17 240 lb (108.9 kg)  07/21/17 237 lb (107.5 kg)  07/09/17 233 lb (105.7 kg)   Past Medical History:  Diagnosis Date  . Arthritis   . Atrial fibrillation ([place])    a. s/p multiple cardioversions; failed tikosyn/sotalol.  Marland Kitchen CAD in native artery, s/p cardiac cath with non obstructive CAD 10/24/2016  . CHF (congestive heart failure) ([place])   . DIVERTICULITIS, HX OF 07/25/2007  . DIVERTICULOSIS, COLON 07/22/2007  . Dyspnea   . Edema, peripheral    a. chronic BLE edema, R>L. Prior trauma from dog attack and accident.  Marland Kitchen HYPERLIPIDEMIA 02/03/2008  . Hypersomnia    declines w/u  . Hypertension   . MENOPAUSAL DISORDER 01/09/2011  . Morbid obesity ([place]) 07/22/2007  . NICM (nonischemic cardiomyopathy) ([place]) 10/24/2016  . Raynaud's syndrome 07/22/2007  . Stroke ([place]) 2017  . THYROID NODULE, RIGHT 01/04/2010  . VITAMIN D DEFICIENCY 01/09/2011   Qualifier: Diagnosis of  By: [name], [name]    Past Surgical History:  Procedure Laterality Date  . CARDIAC CATHETERIZATION N/A 10/23/2016   Procedure: Left Heart Cath and Coronary Angiography;  Surgeon: [name], MD;  Location: [place] [facility];  Service: Cardiovascular;  Laterality: N/A;  . CARDIOVERSION N/A 10/31/2016   Procedure: CARDIOVERSION;  Surgeon: [name], MD;  Location: [place];  Service: Cardiovascular;  Laterality: N/A;  . CARDIOVERSION N/A 11/03/2016   Procedure: CARDIOVERSION;  Surgeon: [name], MD;  Location: [place];  Service: Cardiovascular;  Laterality: N/A;  . CARDIOVERSION N/A 11/18/2016   Procedure: CARDIOVERSION;  Surgeon: [name], MD;  Location: [facility] ENDOSCOPY;  Service: Cardiovascular;  Laterality: N/A;  . CARDIOVERSION N/A 03/25/2017   Procedure: CARDIOVERSION;  Surgeon: [name], MD;  Location: [facility] ENDOSCOPY;  Service: Cardiovascular;  Laterality: N/A;  . CARDIOVERSION N/A 05/06/2017   Procedure: CARDIOVERSION;  Surgeon: [name], MD;  Location: [facility] ENDOSCOPY;  Service: Cardiovascular;  Laterality: N/A;  . CARDIOVERSION N/A 05/12/2017   Procedure: CARDIOVERSION;  Surgeon: [name], MD;  Location: [facility] ENDOSCOPY;  Service: Cardiovascular;  Laterality: N/A;  . CHOLECYSTECTOMY    . COLONOSCOPY    . PARTIAL HYSTERECTOMY     1 OVARY LEFT  . POLYPECTOMY    . TEE WITHOUT CARDIOVERSION N/A 10/31/2016   Procedure: TRANSESOPHAGEAL ECHOCARDIOGRAM (TEE);  Surgeon: [name], MD;  Location: [facility] ENDOSCOPY;  Service: Cardiovascular;  Laterality: N/A;  . TEE WITHOUT CARDIOVERSION N/A 03/25/2017   Procedure: TRANSESOPHAGEAL ECHOCARDIOGRAM (TEE);  Surgeon: [name], MD;  Location: [facility] ENDOSCOPY;  Service: Cardiovascular;  Laterality: N/A;    reports that she has been smoking Cigarettes.  She has been smoking about 0.50 packs per day. She has never used smokeless tobacco. She reports that she drinks about 2.4 oz of alcohol per week . She reports that she does not use drugs. family history includes Asthma in her mother; Diabetes in her father; Heart disease in her father and sister; Lung disease in her sister. Allergies  Allergen Reactions  . Ace Inhibitors Palpitations  Current Outpatient Prescriptions on File Prior to Visit  Medication Sig Dispense Refill  . acetaminophen (TYLENOL) 325 MG tablet Take 650 mg by mouth every 6 (six) hours as needed (pain).     Marland Kitchen amiodarone (PACERONE) 200 MG tablet Take 1 tablet (200 mg total) by mouth daily. 30 tablet 6  . carvedilol (COREG) 3.125 MG tablet Take 1 tablet (3.125 mg total) by mouth 2 (two) times daily. 180  tablet 3  . cephALEXin (KEFLEX) 500 MG capsule Take 1 capsule (500 mg total) by mouth 3 (three) times daily. 21 capsule 0  . diclofenac sodium (VOLTAREN) 1 % GEL Apply 4 g topically 4 (four) times daily as needed (As directed for skin).    . fluconazole (DIFLUCAN) 150 MG tablet 1 tab by mouth every 3 days as needed 2 tablet 1  . isosorbide-hydrALAZINE (BIDIL) 20-37.5 MG tablet Take 2 tablets by mouth 3 (three) times daily. 180 tablet 3  . losartan (COZAAR) 25 MG tablet Take 1 tablet (25 mg total) by mouth daily. 90 tablet 3  . ranolazine (RANEXA) 500 MG 12 hr tablet Take 1 tablet (500 mg total) by mouth 2 (two) times daily. 60 tablet 3  . rosuvastatin (CRESTOR) 10 MG tablet Take 1 tablet (10 mg total) by mouth daily. 90 tablet 3  . spironolactone (ALDACTONE) 25 MG tablet Take 1 tablet (25 mg total) by mouth daily. 90 tablet 3  . warfarin (COUMADIN) 3 MG tablet Take 1 tablet by mouth daily or as directed by coumadin clinic (Patient taking differently: Take 3 mg by mouth daily. ) 30 tablet 3   No current facility-administered medications on file prior to visit.    Review of Systems  Constitutional: Negative for other unusual diaphoresis or sweats HENT: Negative for ear discharge or swelling Eyes: Negative for other worsening visual disturbances Respiratory: Negative for stridor or other swelling  Gastrointestinal: Negative for worsening distension or other blood Genitourinary: Negative for retention or other urinary change Musculoskeletal: Negative for other MSK pain or swelling Skin: Negative for color change or other new lesions Neurological: Negative for worsening tremors and other numbness  Psychiatric/Behavioral: Negative for worsening agitation or other fatigue All other system neg per pt    Objective:   Physical Exam BP (!) 142/84   Pulse 70   Temp 99 F (37.2 C) (Oral)   Ht 5\' 5"  (1.651 m)   Wt 240 lb (108.9 kg)   SpO2 96%   BMI 39.94 kg/m  VS noted,  Constitutional: Pt  appears in NAD HENT: Head: NCAT.  Right Ear: External ear normal.  Left Ear: External ear normal.  Eyes: . Pupils are equal, round, and reactive to light. Conjunctivae and EOM are normal Nose: without d/c or deformity Neck: Neck supple. Gross normal ROM Cardiovascular: Normal rate and regular rhythm.   Pulmonary/Chest: Effort normal and breath sounds without rales or wheezing.  Neurological: Pt is alert. At baseline orientation, motor grossly intact Skin: Skin is warm. No rashes, other new lesions, 1+ bilat LE edema Psychiatric: Pt behavior is normal without agitation  No other exam findings    Assessment & Plan:

## 2017-07-28 NOTE — Patient Instructions (Signed)
OK to restart the lasix at 40 gm in the AM, and the aldactone 25 mg in the AM  Please continue all other medications as before, and refills have been done if requested.  Please have the pharmacy call with any other refills you may need.  Please keep your appointments with your specialists as you may have planned  Please go to the LAB in the Basement (turn left off the elevator) for the tests to be done today  You will be contacted by phone if any changes need to be made immediately.  Otherwise, you will receive a letter about your results with an explanation, but please check with MyChart first.  Please remember to sign up for MyChart if you have not done so, as this will be important to you in the future with finding out test results, communicating by private email, and scheduling acute appointments online when needed.

## 2017-07-28 NOTE — Assessment & Plan Note (Signed)
Hopefully improved, for f/u lab today after mild recent overdiuresis

## 2017-07-28 NOTE — Assessment & Plan Note (Signed)
Ok for restart lasix at lower dose 40 qd, and aldactone 25 qd

## 2017-07-28 NOTE — Assessment & Plan Note (Signed)
stable overall by history and exam, recent data reviewed with pt, and pt to continue medical treatment as before,  to f/u any worsening symptoms or concerns BP Readings from Last 3 Encounters:  07/28/17 (!) 142/84  07/21/17 124/76  07/09/17 126/70

## 2017-07-29 ENCOUNTER — Encounter (HOSPITAL_COMMUNITY): Payer: Managed Care, Other (non HMO) | Admitting: Internal Medicine

## 2017-07-29 ENCOUNTER — Encounter: Payer: Self-pay | Admitting: Internal Medicine

## 2017-07-30 ENCOUNTER — Telehealth (HOSPITAL_COMMUNITY): Payer: Self-pay | Admitting: Pharmacist

## 2017-07-30 ENCOUNTER — Encounter: Payer: Self-pay | Admitting: *Deleted

## 2017-07-30 DIAGNOSIS — Z006 Encounter for examination for normal comparison and control in clinical research program: Secondary | ICD-10-CM

## 2017-07-30 NOTE — Telephone Encounter (Signed)
Patricia Wagner was being seen with [facility] today and was asking about assistance with the cost of her Bidil and Ranexa. I have sent in PA's for both and have provided her with a Ranexa copay card to use once it is approved by the insurance. She already has a Bidil copay card that she can use once it too is approved by the insurance. All info relayed to patient and she verbalized understanding.   [name]. [name], PharmD, BCPS, CPP Clinical Pharmacist Pager: [phone] Phone: [phone] 07/30/2017 3:21 PM

## 2017-07-30 NOTE — Progress Notes (Addendum)
Galactic Heart Failure ResearchInformed Consent  Subject Name: Patricia Wagner  Subject met inclusion and exclusion criteria. The informed consent form, study requirements and expectations were reviewed with the subject and questions and concerns were addressed prior to the signing of the consent form. The subject verbalized understanding of the trail requirements. The subject agreed to participate in the Galactic Heart Failuretrial and signed the informed consent. The informed consent was obtained prior to performance of any protocol-specific procedures for the subject. A copy of the signed informed consent was given to the subject.  Chrismon, Maggie L, RN 07/30/2017            

## 2017-07-31 ENCOUNTER — Telehealth (HOSPITAL_COMMUNITY): Payer: Self-pay | Admitting: Pharmacist

## 2017-07-31 ENCOUNTER — Telehealth: Payer: Self-pay | Admitting: *Deleted

## 2017-07-31 NOTE — Telephone Encounter (Signed)
Bidil PA denied by [place]. Patient has enough Bidil to last 1 month. Will have her fill out [facility] patient assistance application at her next appointment with [name].   [name]. [name], PharmD, BCPS, CPP Clinical Pharmacist Pager: [phone] Phone: [phone] 07/31/2017 9:34 AM

## 2017-07-31 NOTE — Telephone Encounter (Signed)
Notified Patricia Wagner that the [employer] received her labs and she qualifies for the [employer] Failure Study.  Let subject know that her BNP lab result is 162.7.  Planned appointment for Day 1/Randomization is September 14th at 13:00.

## 2017-08-04 ENCOUNTER — Telehealth (HOSPITAL_COMMUNITY): Payer: Self-pay | Admitting: Pharmacist

## 2017-08-04 NOTE — Telephone Encounter (Signed)
Ranexa 500 mg BID PA approved by [employer] through 11/23/38.   [name]. [name], PharmD, BCPS, CPP Clinical Pharmacist Pager: [phone] Phone: [phone] 08/04/2017 2:03 PM

## 2017-08-06 ENCOUNTER — Encounter (HOSPITAL_COMMUNITY): Payer: Managed Care, Other (non HMO) | Admitting: Internal Medicine

## 2017-08-06 ENCOUNTER — Telehealth: Payer: Self-pay | Admitting: *Deleted

## 2017-08-06 NOTE — Telephone Encounter (Signed)
Left voicemail to confirm appointment for [facility] tomorrow, Sept 14th.  Subject has a screening period of 8 weeks from ICF date of Sept 6th.  Will attempt to contact again.

## 2017-08-07 ENCOUNTER — Other Ambulatory Visit: Payer: Self-pay | Admitting: Cardiology

## 2017-08-07 ENCOUNTER — Encounter: Payer: Self-pay | Admitting: Cardiology

## 2017-08-07 NOTE — Telephone Encounter (Signed)
Spoke with pt, aware of need for lab work and order placed for biopsy and they will call the patient to schedule. Patient is aware to hold warfarin.

## 2017-08-07 NOTE — Telephone Encounter (Signed)
New message      Calling regarding the order placed for an US guided needle placement.  She is not sure what you want.  Please call

## 2017-08-07 NOTE — Telephone Encounter (Signed)
This encounter was created in error - please disregard.

## 2017-08-10 ENCOUNTER — Other Ambulatory Visit: Payer: Self-pay | Admitting: Cardiology

## 2017-08-12 ENCOUNTER — Encounter: Payer: Self-pay | Admitting: *Deleted

## 2017-08-12 NOTE — Progress Notes (Signed)
This encounter was created in error - please disregard.

## 2017-08-13 ENCOUNTER — Other Ambulatory Visit: Payer: Self-pay | Admitting: Student

## 2017-08-13 LAB — KAPPA/LAMBDA LIGHT CHAINS
IG KAPPA FREE LIGHT CHAIN: 54.3 mg/L — AB (ref 3.3–19.4)
Ig Lambda Free Light Chain: 45.9 mg/L — ABNORMAL HIGH (ref 5.7–26.3)
KAPPA/LAMBDA FLC RATIO: 1.18 (ref 0.26–1.65)

## 2017-08-15 LAB — PROTEIN ELECTROPHORESIS, URINE REFLEX
Albumin ELP, Urine: 33.2 %
Alpha-1-Globulin, U: 4.8 %
Alpha-2-Globulin, U: 9.7 %
Beta Globulin, U: 23.9 %
Gamma Globulin, U: 28.5 %
PROTEIN UR: 15.1 mg/dL

## 2017-08-15 LAB — PROTEIN ELECTROPHORESIS, SERUM
A/G RATIO SPE: 1.1 (ref 0.7–1.7)
ALPHA 1: 0.2 g/dL (ref 0.0–0.4)
Albumin ELP: 3.8 g/dL (ref 2.9–4.4)
Alpha 2: 0.7 g/dL (ref 0.4–1.0)
Beta: 1 g/dL (ref 0.7–1.3)
GAMMA GLOBULIN: 1.6 g/dL (ref 0.4–1.8)
Globulin, Total: 3.4 g/dL (ref 2.2–3.9)
Total Protein: 7.2 g/dL (ref 6.0–8.5)

## 2017-08-16 ENCOUNTER — Other Ambulatory Visit: Payer: Self-pay | Admitting: Radiology

## 2017-08-17 ENCOUNTER — Other Ambulatory Visit: Payer: Self-pay | Admitting: Student

## 2017-08-18 ENCOUNTER — Encounter (HOSPITAL_COMMUNITY): Payer: Self-pay

## 2017-08-18 ENCOUNTER — Other Ambulatory Visit: Payer: Self-pay | Admitting: Cardiology

## 2017-08-18 ENCOUNTER — Ambulatory Visit (HOSPITAL_COMMUNITY)
Admission: RE | Admit: 2017-08-18 | Discharge: 2017-08-18 | Disposition: A | Discharge status: Home / Self Care | Payer: BLUE CROSS/BLUE SHIELD | Source: Ambulatory Visit | Attending: Cardiology | Admitting: Cardiology

## 2017-08-18 DIAGNOSIS — Z825 Family history of asthma and other chronic lower respiratory diseases: Secondary | ICD-10-CM | POA: Insufficient documentation

## 2017-08-18 DIAGNOSIS — Z888 Allergy status to other drugs, medicaments and biological substances status: Secondary | ICD-10-CM | POA: Diagnosis not present

## 2017-08-18 DIAGNOSIS — I509 Heart failure, unspecified: Secondary | ICD-10-CM | POA: Insufficient documentation

## 2017-08-18 DIAGNOSIS — I2510 Atherosclerotic heart disease of native coronary artery without angina pectoris: Secondary | ICD-10-CM | POA: Diagnosis not present

## 2017-08-18 DIAGNOSIS — I4891 Unspecified atrial fibrillation: Secondary | ICD-10-CM | POA: Insufficient documentation

## 2017-08-18 DIAGNOSIS — I517 Cardiomegaly: Secondary | ICD-10-CM

## 2017-08-18 DIAGNOSIS — I7300 Raynaud's syndrome without gangrene: Secondary | ICD-10-CM | POA: Diagnosis not present

## 2017-08-18 DIAGNOSIS — Z8249 Family history of ischemic heart disease and other diseases of the circulatory system: Secondary | ICD-10-CM | POA: Insufficient documentation

## 2017-08-18 DIAGNOSIS — I429 Cardiomyopathy, unspecified: Secondary | ICD-10-CM | POA: Insufficient documentation

## 2017-08-18 DIAGNOSIS — Z9889 Other specified postprocedural states: Secondary | ICD-10-CM | POA: Insufficient documentation

## 2017-08-18 DIAGNOSIS — E559 Vitamin D deficiency, unspecified: Secondary | ICD-10-CM | POA: Insufficient documentation

## 2017-08-18 DIAGNOSIS — E785 Hyperlipidemia, unspecified: Secondary | ICD-10-CM | POA: Diagnosis not present

## 2017-08-18 DIAGNOSIS — Z90710 Acquired absence of both cervix and uterus: Secondary | ICD-10-CM | POA: Insufficient documentation

## 2017-08-18 DIAGNOSIS — F17210 Nicotine dependence, cigarettes, uncomplicated: Secondary | ICD-10-CM | POA: Diagnosis not present

## 2017-08-18 DIAGNOSIS — Z9049 Acquired absence of other specified parts of digestive tract: Secondary | ICD-10-CM | POA: Diagnosis not present

## 2017-08-18 DIAGNOSIS — Z8673 Personal history of transient ischemic attack (TIA), and cerebral infarction without residual deficits: Secondary | ICD-10-CM | POA: Insufficient documentation

## 2017-08-18 DIAGNOSIS — E8589 Other amyloidosis: Secondary | ICD-10-CM | POA: Insufficient documentation

## 2017-08-18 DIAGNOSIS — Z833 Family history of diabetes mellitus: Secondary | ICD-10-CM | POA: Insufficient documentation

## 2017-08-18 DIAGNOSIS — Z7901 Long term (current) use of anticoagulants: Secondary | ICD-10-CM | POA: Diagnosis not present

## 2017-08-18 DIAGNOSIS — I110 Hypertensive heart disease with heart failure: Secondary | ICD-10-CM | POA: Insufficient documentation

## 2017-08-18 LAB — CBC
HCT: 43.1 % (ref 36.0–46.0)
HEMOGLOBIN: 13.6 g/dL (ref 12.0–15.0)
MCH: 29.9 pg (ref 26.0–34.0)
MCHC: 31.6 g/dL (ref 30.0–36.0)
MCV: 94.7 fL (ref 78.0–100.0)
PLATELETS: 156 10*3/uL (ref 150–400)
RBC: 4.55 MIL/uL (ref 3.87–5.11)
RDW: 15.5 % (ref 11.5–15.5)
WBC: 6.8 10*3/uL (ref 4.0–10.5)

## 2017-08-18 LAB — PROTIME-INR
INR: 1.03
PROTHROMBIN TIME: 13.4 s (ref 11.4–15.2)

## 2017-08-18 LAB — APTT: APTT: 24 s (ref 24–36)

## 2017-08-18 MED ORDER — SODIUM CHLORIDE 0.9 % IV SOLN: INTRAVENOUS | Status: DC

## 2017-08-18 MED ORDER — LIDOCAINE HCL (PF) 1 % IJ SOLN
INTRAMUSCULAR | Status: AC
  Filled 2017-08-18: qty 30

## 2017-08-18 NOTE — Procedures (Signed)
Interventional Radiology Procedure Note  Procedure: US guided biopsy of abdominal wall fat  Complications: None  Estimated Blood Loss: None  Abdominal wall fat biopsy with 11 G needle under US guidance.  Placed in formalin.  Venetia Night. Kathlene Cote, M.D Pager:  [phone]

## 2017-08-18 NOTE — Discharge Instructions (Signed)
Needle Biopsy, Care After °Refer to this sheet in the next few weeks. These instructions provide you with information about caring for yourself after your procedure. Your health care provider may also give you more specific instructions. Your treatment has been planned according to current medical practices, but problems sometimes occur. Call your health care provider if you have any problems or questions after your procedure. °What can I expect after the procedure? °After your procedure, it is common to have soreness, bruising, or mild pain at the biopsy site. This should go away in a few days. °Follow these instructions at home: °· Rest as directed by your health care provider. °· Take medicines only as directed by your health care provider. °· There are many different ways to close and cover the biopsy site, including stitches (sutures), skin glue, and adhesive strips. Follow your health care provider's instructions about: °? Biopsy site care. °? Bandage (dressing) changes and removal. °? Biopsy site closure removal. °· Check your biopsy site every day for signs of infection. Watch for: °? Redness, swelling, or pain. °? Fluid, blood, or pus. °Contact a health care provider if: °· You have a fever. °· You have redness, swelling, or pain at the biopsy site that lasts longer than a few days. °· You have fluid, blood, or pus coming from the biopsy site. °· You feel nauseous. °· You vomit. °Get help right away if: °· You have shortness of breath. °· You have trouble breathing. °· You have chest pain. °· You feel dizzy or you faint. °· You have bleeding that does not stop with pressure or a bandage. °· You cough up blood. °· You have pain in your abdomen. °This information is not intended to replace advice given to you by your health care provider. Make sure you discuss any questions you have with your health care provider. °Document Released: 03/27/2015 Document Revised: 04/17/2016 Document Reviewed:  11/06/2014 °Elsevier Interactive Patient Education © 2018 [employer] °Moderate Conscious Sedation, Adult, Care After °These instructions provide you with information about caring for yourself after your procedure. Your health care provider may also give you more specific instructions. Your treatment has been planned according to current medical practices, but problems sometimes occur. Call your health care provider if you have any problems or questions after your procedure. °What can I expect after the procedure? °After your procedure, it is common: °· To feel sleepy for several hours. °· To feel clumsy and have poor balance for several hours. °· To have poor judgment for several hours. °· To vomit if you eat too soon. ° °Follow these instructions at home: °For at least 24 hours after the procedure: ° °· Do not: °? Participate in activities where you could fall or become injured. °? Drive. °? Use heavy machinery. °? Drink alcohol. °? Take sleeping pills or medicines that cause drowsiness. °? Make important decisions or sign legal documents. °? Take care of children on your own. °· Rest. °Eating and drinking °· Follow the diet recommended by your health care provider. °· If you vomit: °? Drink water, juice, or soup when you can drink without vomiting. °? Make sure you have little or no nausea before eating solid foods. °General instructions °· Have a responsible adult stay with you until you are awake and alert. °· Take over-the-counter and prescription medicines only as told by your health care provider. °· If you smoke, do not smoke without supervision. °· Keep all follow-up visits as told by your health care   provider. This is important. °Contact a health care provider if: °· You keep feeling nauseous or you keep vomiting. °· You feel light-headed. °· You develop a rash. °· You have a fever. °Get help right away if: °· You have trouble breathing. °This information is not intended to replace advice given to you  by your health care provider. Make sure you discuss any questions you have with your health care provider. °Document Released: 08/31/2013 Document Revised: 04/14/2016 Document Reviewed: 03/01/2016 °Elsevier Interactive Patient Education © 2018 Elsevier Inc. ° °

## 2017-08-18 NOTE — H&P (Signed)
Chief Complaint: left ventricular hypertrophy  Referring Physician:Dr. Kirk Ruths  Supervising Physician: Aletta Edouard  Patient Status: [facility] - Out-pt  HPI: Patricia Wagner is a 66 y.o. female who is followed by Dr. [name].  She has been found to have LVH.  Limited information is available to me, but a request has been made for an abdominal fat pad biopsy to rule out amyloidosis.  She presents today for this procedure.  She does take coumadin for a fib, which she has held for 4 days.  She otherwise has no complaints.  Past Medical History:  Past Medical History:  Diagnosis Date  . Arthritis   . Atrial fibrillation ([place])    a. s/p multiple cardioversions; failed tikosyn/sotalol.  Marland Kitchen CAD in native artery, s/p cardiac cath with non obstructive CAD 10/24/2016  . CHF (congestive heart failure) ([place])   . DIVERTICULITIS, HX OF 07/25/2007  . DIVERTICULOSIS, COLON 07/22/2007  . Dyspnea   . Edema, peripheral    a. chronic BLE edema, R>L. Prior trauma from dog attack and accident.  Marland Kitchen HYPERLIPIDEMIA 02/03/2008  . Hypersomnia    declines w/u  . Hypertension   . MENOPAUSAL DISORDER 01/09/2011  . Morbid obesity ([place]) 07/22/2007  . NICM (nonischemic cardiomyopathy) ([place]) 10/24/2016  . Raynaud's syndrome 07/22/2007  . Stroke ([place]) 2017  . THYROID NODULE, RIGHT 01/04/2010  . VITAMIN D DEFICIENCY 01/09/2011   Qualifier: Diagnosis of  By: [name], [name]     Past Surgical History:  Past Surgical History:  Procedure Laterality Date  . CARDIAC CATHETERIZATION N/A 10/23/2016   Procedure: Left Heart Cath and Coronary Angiography;  Surgeon: [name], MD;  Location: [place] [facility];  Service: Cardiovascular;  Laterality: N/A;  . CARDIOVERSION N/A 10/31/2016   Procedure: CARDIOVERSION;  Surgeon: [name], MD;  Location: [place];  Service: Cardiovascular;  Laterality: N/A;  . CARDIOVERSION N/A 11/03/2016   Procedure: CARDIOVERSION;  Surgeon: [name], MD;  Location: [place];  Service: Cardiovascular;  Laterality: N/A;  . CARDIOVERSION N/A 11/18/2016   Procedure: CARDIOVERSION;  Surgeon: [name], MD;  Location: [facility] ENDOSCOPY;  Service: Cardiovascular;  Laterality: N/A;  . CARDIOVERSION N/A 03/25/2017   Procedure: CARDIOVERSION;  Surgeon: [name], MD;  Location: [facility] ENDOSCOPY;  Service: Cardiovascular;  Laterality: N/A;  . CARDIOVERSION N/A 05/06/2017   Procedure: CARDIOVERSION;  Surgeon: [name], MD;  Location: [facility] ENDOSCOPY;  Service: Cardiovascular;  Laterality: N/A;  . CARDIOVERSION N/A 05/12/2017   Procedure: CARDIOVERSION;  Surgeon: [name], MD;  Location: [facility] ENDOSCOPY;  Service: Cardiovascular;  Laterality: N/A;  . CHOLECYSTECTOMY    . COLONOSCOPY    . PARTIAL HYSTERECTOMY     1 OVARY LEFT  . POLYPECTOMY    . TEE WITHOUT CARDIOVERSION N/A 10/31/2016   Procedure: TRANSESOPHAGEAL ECHOCARDIOGRAM (TEE);  Surgeon: [name], MD;  Location: [facility] ENDOSCOPY;  Service: Cardiovascular;  Laterality: N/A;  . TEE WITHOUT CARDIOVERSION N/A 03/25/2017   Procedure: TRANSESOPHAGEAL ECHOCARDIOGRAM (TEE);  Surgeon: [name], MD;  Location: [facility] ENDOSCOPY;  Service: Cardiovascular;  Laterality: N/A;    Family History:  Family History  Problem Relation Age of Onset  . Asthma Mother   . Diabetes Father   . Heart disease Father        Died of presumed heart attack - 76s  . Lung disease Sister   . Heart disease Sister        Twin sister has heart issue, unclear what  kind  . Thyroid disease Neg Hx     Social History:  reports that she has been smoking Cigarettes.  She has been smoking about 0.50 packs per day. She has never used smokeless tobacco. She reports that she drinks about 2.4 oz of alcohol per week . She reports that she does not use drugs.  Allergies:  Allergies  Allergen Reactions  . Ace Inhibitors Palpitations    Medications: Medications reviewed in epic  Please HPI for pertinent positives, otherwise  complete 10 system ROS negative.  Mallampati Score: MD Evaluation Airway: WNL Heart: WNL Abdomen: WNL Chest/ Lungs: WNL ASA  Classification: 3 Mallampati/Airway Score: One  Physical Exam: BP 112/60 (BP Location: Left Arm)   Pulse 63   Temp 98.2 F (36.8 C) (Oral)   Resp 16   SpO2 100%  There is no height or weight on file to calculate BMI. General: pleasant, WD, WN black female who is laying in bed in NAD HEENT: head is normocephalic, atraumatic.  Sclera are noninjected.  PERRL.  Ears and nose without any masses or lesions.  Mouth is pink and moist Heart: regular, rate, and rhythm.  Normal s1,s2. No obvious murmurs, gallops, or rubs noted.  Palpable radial and pedal pulses bilaterally Lungs: CTAB, no wheezes, rhonchi, or rales noted.  Respiratory effort nonlabored Abd: soft, NT, ND, +BS, no masses, hernias, or organomegaly Psych: A&Ox3 with an appropriate affect.   Labs: Pending   Imaging: No results found.  Assessment/Plan 1. Left ventricular hypertrophy  We will plan to proceed today with an abdominal fat pad biopsy to rule out amyloidosis.  Labs are pending.  Vitals have been reviewed.  Risks and benefits discussed with the patient including, but not limited to bleeding, infection, damage to adjacent structures or low yield requiring additional tests. All of the patient's questions were answered, patient is agreeable to proceed. Consent signed and in chart.  Thank you for this interesting consult.  I greatly enjoyed meeting [name] and look forward to participating in their care.  A copy of this report was sent to the requesting provider on this date.  Electronically Signed: Henreitta Cea 08/18/2017, 12:31 PM   I spent a total of  30 Minutes   in face to face in clinical consultation, greater than 50% of which was counseling/coordinating care for left ventricular hypertrophy

## 2017-08-19 ENCOUNTER — Ambulatory Visit (HOSPITAL_COMMUNITY)
Admission: RE | Admit: 2017-08-19 | Discharge: 2017-08-19 | Disposition: A | Discharge status: Home / Self Care | Payer: BLUE CROSS/BLUE SHIELD | Source: Ambulatory Visit | Attending: Internal Medicine | Admitting: Internal Medicine

## 2017-08-19 ENCOUNTER — Encounter (HOSPITAL_COMMUNITY): Payer: Self-pay | Admitting: Internal Medicine

## 2017-08-19 VITALS — BP 140/80 | HR 52 | Wt 229.0 lb

## 2017-08-19 DIAGNOSIS — F17210 Nicotine dependence, cigarettes, uncomplicated: Secondary | ICD-10-CM | POA: Insufficient documentation

## 2017-08-19 DIAGNOSIS — Z7901 Long term (current) use of anticoagulants: Secondary | ICD-10-CM | POA: Insufficient documentation

## 2017-08-19 DIAGNOSIS — I7300 Raynaud's syndrome without gangrene: Secondary | ICD-10-CM | POA: Insufficient documentation

## 2017-08-19 DIAGNOSIS — Z95811 Presence of heart assist device: Secondary | ICD-10-CM | POA: Insufficient documentation

## 2017-08-19 DIAGNOSIS — E785 Hyperlipidemia, unspecified: Secondary | ICD-10-CM | POA: Insufficient documentation

## 2017-08-19 DIAGNOSIS — I429 Cardiomyopathy, unspecified: Secondary | ICD-10-CM | POA: Diagnosis not present

## 2017-08-19 DIAGNOSIS — I5082 Biventricular heart failure: Secondary | ICD-10-CM | POA: Diagnosis present

## 2017-08-19 DIAGNOSIS — I5022 Chronic systolic (congestive) heart failure: Secondary | ICD-10-CM

## 2017-08-19 DIAGNOSIS — I447 Left bundle-branch block, unspecified: Secondary | ICD-10-CM | POA: Insufficient documentation

## 2017-08-19 DIAGNOSIS — I2510 Atherosclerotic heart disease of native coronary artery without angina pectoris: Secondary | ICD-10-CM | POA: Insufficient documentation

## 2017-08-19 DIAGNOSIS — I481 Persistent atrial fibrillation: Secondary | ICD-10-CM | POA: Insufficient documentation

## 2017-08-19 DIAGNOSIS — Z79899 Other long term (current) drug therapy: Secondary | ICD-10-CM | POA: Insufficient documentation

## 2017-08-19 DIAGNOSIS — Z8673 Personal history of transient ischemic attack (TIA), and cerebral infarction without residual deficits: Secondary | ICD-10-CM | POA: Insufficient documentation

## 2017-08-19 DIAGNOSIS — J449 Chronic obstructive pulmonary disease, unspecified: Secondary | ICD-10-CM | POA: Diagnosis not present

## 2017-08-19 DIAGNOSIS — I130 Hypertensive heart and chronic kidney disease with heart failure and stage 1 through stage 4 chronic kidney disease, or unspecified chronic kidney disease: Secondary | ICD-10-CM | POA: Diagnosis not present

## 2017-08-19 DIAGNOSIS — N183 Chronic kidney disease, stage 3 (moderate): Secondary | ICD-10-CM | POA: Insufficient documentation

## 2017-08-19 DIAGNOSIS — E859 Amyloidosis, unspecified: Secondary | ICD-10-CM

## 2017-08-19 DIAGNOSIS — I38 Endocarditis, valve unspecified: Secondary | ICD-10-CM | POA: Diagnosis not present

## 2017-08-19 DIAGNOSIS — I4819 Other persistent atrial fibrillation: Secondary | ICD-10-CM

## 2017-08-19 LAB — BASIC METABOLIC PANEL
ANION GAP: 5 (ref 5–15)
BUN: 45 mg/dL — AB (ref 6–20)
CO2: 29 mmol/L (ref 22–32)
Calcium: 9.2 mg/dL (ref 8.9–10.3)
Chloride: 102 mmol/L (ref 101–111)
Creatinine, Ser: 2.11 mg/dL — ABNORMAL HIGH (ref 0.44–1.00)
GFR, EST AFRICAN AMERICAN: 27 mL/min — AB (ref >60)
GFR, EST NON AFRICAN AMERICAN: 24 mL/min — AB (ref >60)
Glucose, Bld: 96 mg/dL (ref 65–99)
POTASSIUM: 3.8 mmol/L (ref 3.5–5.1)
SODIUM: 136 mmol/L (ref 135–145)

## 2017-08-19 MED ORDER — SACUBITRIL-VALSARTAN 49-51 MG PO TABS: 1.0000 | ORAL_TABLET | Freq: Two times a day (BID) | ORAL | 3 refills | Status: DC

## 2017-08-19 NOTE — Patient Instructions (Signed)
Stop Losartan   Start Entresto 49/51 mg (1 tab), twice a day  You have been referred to [facility], MD  Labs drawn today (if we do not call you, then your lab work was stable)   Your physician recommends that you schedule a follow-up appointment in: 2 months

## 2017-08-19 NOTE — Progress Notes (Signed)
Advanced Heart Failure Clinic Note    Primary Cardiologist: Dr. [name] Primary HF: [name]   HPI:  Patricia Wagner is a 66 y.o. female 66 year old female with a past medical history of persistent atrial fibrillation, NICM cardiomyopathy, Biventricular CHF (LVEF 15% and RV severely reduced by TEE 03/25/17), HTN, and HLD.   She has struggled with symptomatic afib since 10/2016. Had successful DCCV 10/2016. Failed DCCV 03/2017, after stopping her amiodarone. Seen in A-Fib clinic 04/09/17 and planned for repeat DCCV after further amio load.   Pt admitted to [facility] 6/6 - 05/16/17 with cardiogenic shock in the setting of persistent AF. EF 15% with severe biventricular dysfunction. Started on amio gtt and PICC line placed for dobutamine with low output HF. Underwent DCCV 05/06/17 but went back into Afib 05/08/17 and developed recurrent cardiogenic shock, requiring re-initiation of dobutamine and amio. Pt had repeat DCCV 05/12/17 with successful conversion to NSR. Amio gtt continued to ensure adequate load. Transitioned to po 05/15/17. Ranexa also added to her regimen for added anti-arrhythmic benefit. HF meds optimized as tolerated.  PT recommended SNF, but pt preferred to return home with HHPT.   Pt presents today for follow up. Feeling well. Denies CP or SOB. No palpitations. No edema, orthopnea or PND. Able to do all ADLs and go to store without problem. Has retired from work.   Echo from 07/15/17 EF 30-35% Severe LVH. RV down. Personally reviewed     Past Medical History:  Diagnosis Date  . Arthritis   . Atrial fibrillation ([place])    a. s/p multiple cardioversions; failed tikosyn/sotalol.  Marland Kitchen CAD in native artery, s/p cardiac cath with non obstructive CAD 10/24/2016  . CHF (congestive heart failure) ([place])   . DIVERTICULITIS, HX OF 07/25/2007  . DIVERTICULOSIS, COLON 07/22/2007  . Dyspnea   . Edema, peripheral    a. chronic BLE edema, R>L. Prior trauma from dog attack and accident.  Marland Kitchen  HYPERLIPIDEMIA 02/03/2008  . Hypersomnia    declines w/u  . Hypertension   . MENOPAUSAL DISORDER 01/09/2011  . Morbid obesity ([place]) 07/22/2007  . NICM (nonischemic cardiomyopathy) ([place]) 10/24/2016  . Raynaud's syndrome 07/22/2007  . Stroke ([place]) 2017  . THYROID NODULE, RIGHT 01/04/2010  . VITAMIN D DEFICIENCY 01/09/2011   Qualifier: Diagnosis of  By: [name], [name]     Current Outpatient Prescriptions  Medication Sig Dispense Refill  . acetaminophen (TYLENOL) 325 MG tablet Take 650 mg by mouth every 6 (six) hours as needed (pain).     Marland Kitchen amiodarone (PACERONE) 200 MG tablet Take 1 tablet (200 mg total) by mouth daily. 30 tablet 6  . carvedilol (COREG) 3.125 MG tablet Take 1 tablet (3.125 mg total) by mouth 2 (two) times daily. 180 tablet 3  . diclofenac sodium (VOLTAREN) 1 % GEL Apply 4 g topically 4 (four) times daily as needed (As directed for skin).    . fluconazole (DIFLUCAN) 150 MG tablet 1 tab by mouth every 3 days as needed 2 tablet 1  . furosemide (LASIX) 40 MG tablet Take 1 tablet (40 mg total) by mouth daily. 90 tablet 3  . isosorbide-hydrALAZINE (BIDIL) 20-37.5 MG tablet Take 2 tablets by mouth 3 (three) times daily. 180 tablet 3  . losartan (COZAAR) 25 MG tablet Take 1 tablet (25 mg total) by mouth daily. 90 tablet 3  . ranolazine (RANEXA) 500 MG 12 hr tablet Take 1 tablet (500 mg total) by mouth 2 (two) times daily. 60 tablet 3  .  rosuvastatin (CRESTOR) 10 MG tablet Take 1 tablet (10 mg total) by mouth daily. 90 tablet 3  . spironolactone (ALDACTONE) 25 MG tablet Take 1 tablet (25 mg total) by mouth daily. 90 tablet 3  . warfarin (COUMADIN) 3 MG tablet TAKE 1 TABLET BY MOUTH DAILY OR AS DIRECTED BY COUMADIN CLINIC 30 tablet 3   No current facility-administered medications for this encounter.     Allergies  Allergen Reactions  . Ace Inhibitors Palpitations     Social History   Social History  . Marital status: Single    Spouse name: N/A  . Number of children: 1  .  Years of education: N/A   Occupational History  . Printing     Social History Main Topics  . Smoking status: Current Every Day Smoker    Packs/day: 0.50    Types: Cigarettes    Last attempt to quit: 10/23/2016  . Smokeless tobacco: Never Used     Comment: Smoked for 50 years  . Alcohol use 2.4 oz/week    4 Cans of beer per week     Comment: Intermittent  . Drug use: No  . Sexual activity: Not on file   Other Topics Concern  . Not on file   Social History Narrative  . No narrative on file     Family History  Problem Relation Age of Onset  . Asthma Mother   . Diabetes Father   . Heart disease Father        Died of presumed heart attack - 78s  . Lung disease Sister   . Heart disease Sister        Twin sister has heart issue, unclear what kind  . Thyroid disease Neg Hx     Vitals:   08/19/17 1440  BP: 140/80  Pulse: (!) 52  Weight: 229 lb (103.9 kg)    Wt Readings from Last 3 Encounters:  08/19/17 229 lb (103.9 kg)  08/18/17 235 lb (106.6 kg)  07/30/17 235 lb (106.6 kg)    PHYSICAL EXAM: General:  Well appearing. No resp difficulty HEENT: normal Neck: supple. no JVD. Carotids 2+ bilat; no bruits. No lymphadenopathy or thryomegaly appreciated. Cor: PMI nondisplaced. Regular rate & rhythm. +s4 Lungs: clear Abdomen: soft, nontender, nondistended. No hepatosplenomegaly. No bruits or masses. Good bowel sounds. Extremities: no cyanosis, clubbing, rash, edema Neuro: alert & orientedx3, cranial nerves grossly intact. moves all 4 extremities w/o difficulty. Affect pleasant    ASSESSMENT & PLAN:   1. Chronic biventricular CHF due to NICM - TEE 03/25/17 with LVEF 15% and severely reduced RV function.   - Required dobutamine during recent admission with cardiogenic shock in setting of Afib RVR. Weaned off prior to discharge.  - Echo today reviewed personally. EF improved to 30-35% + severe LVH and moderately reduced RV function. Personally reviewed - Improved NYHA II  symptoms.   - Volume status stable on exam.  - Continue lasix 40 mg BID. BMET today.  - No BB with recent low output.  - Will stop losartan and start Entresto 49/51 bid. Can cut lasix back as needed. - Will need work-up for amyloid. Check SPEP and IFE. PYP scan ordered.  - Continue spironolactone 25 mg daily - Continue Bidil 2 tabs TID.  - Reinforced fluid restriction to < 2 L daily, sodium restriction to less than 2000 mg daily, and the importance of daily weights.   2. Persistent Afib: Cannot tolerate AF with recurrent shock. S/p DCCV on 5/2 (she stopped  Amio after this) and DCCV on 05/06/17 and again 05/12/17. - Remains in NSR today.  - Continue Ranexa 500 mg BID.  - Continue amiodarone 200 mg daily.  - Continue warfarin. INR stable 05/22/17. Follows with coumadin clinic. Will consider DOAC.  - She absolutely cannot tolerate AF with recurrent cardiogenic shock when AF recurs. She was seen by Dr. Curt Bears in the hospital will refer back to consider AF ablation now that she is more stable. 3. Suspected Sleep Apnea: - Sleep study completed. Results pending.  4. CKD III: - BMET today.  5. COPD:  - No wheezing.  Total time spent 45 minutes. Over half that time spent discussing above.    Glori Bickers, MD 08/19/17

## 2017-08-20 ENCOUNTER — Telehealth (HOSPITAL_COMMUNITY): Payer: Self-pay | Admitting: Pharmacist

## 2017-08-20 NOTE — Telephone Encounter (Signed)
Entresto 49-51 mg BID PA approved by [employer] through 11/23/38. Ms. Boller aware.   [name]. [name], PharmD, BCPS, CPP Clinical Pharmacist Pager: [phone] Phone: [phone] 08/20/2017 1:27 PM

## 2017-08-21 ENCOUNTER — Telehealth (HOSPITAL_COMMUNITY): Payer: Self-pay | Admitting: *Deleted

## 2017-08-21 NOTE — Telephone Encounter (Signed)
Second attempt to reach patient regarding her work status so that I can complete her disability paperwork.  Will wait for her to call me back.

## 2017-08-21 NOTE — Telephone Encounter (Signed)
Verified payment with [facility] on [address].   [name]. [name], PharmD, BCPS, CPP Clinical Pharmacist Pager: [phone] Phone: [phone] 08/21/2017 9:14 AM

## 2017-08-24 ENCOUNTER — Encounter (HOSPITAL_COMMUNITY): Payer: Self-pay | Admitting: *Deleted

## 2017-08-24 ENCOUNTER — Ambulatory Visit (INDEPENDENT_AMBULATORY_CARE_PROVIDER_SITE_OTHER): Payer: BLUE CROSS/BLUE SHIELD | Admitting: Pharmacist

## 2017-08-24 ENCOUNTER — Telehealth (HOSPITAL_COMMUNITY): Payer: Self-pay | Admitting: *Deleted

## 2017-08-24 DIAGNOSIS — I5022 Chronic systolic (congestive) heart failure: Secondary | ICD-10-CM

## 2017-08-24 DIAGNOSIS — Z7901 Long term (current) use of anticoagulants: Secondary | ICD-10-CM

## 2017-08-24 LAB — POCT INR: INR: 1.1

## 2017-08-24 NOTE — Telephone Encounter (Signed)
Notes recorded by Scarlette Calico, RN on 08/24/2017 at 4:50 PM EDT Pt aware and agreeable, she states she has been taking Lasix 40 mg BID, will decrease to daily. Pt is sch to see Dr Curt Bears 10/10, order placed for bmet at that time.

## 2017-08-24 NOTE — Progress Notes (Signed)
Patient sent disability forms.  Forms completed today and patient is aware that they are ready for her to pickup per her request.   Copy of original forms will be scanned to patient electronic medical record.

## 2017-08-24 NOTE — Telephone Encounter (Signed)
-----   Message from [name], MD sent at 08/23/2017 12:02 PM EDT ----- Need to cut lasix back. I think she is taking 40 bid. If so decreased to 40 daily. Recheck 1 week. If only taking lasix 40 daily change to every other day and repeat in one week. Thanks

## 2017-08-26 ENCOUNTER — Encounter (HOSPITAL_COMMUNITY): Payer: BLUE CROSS/BLUE SHIELD

## 2017-08-26 ENCOUNTER — Encounter (HOSPITAL_COMMUNITY)
Admission: RE | Admit: 2017-08-26 | Discharge: 2017-08-26 | Disposition: A | Discharge status: Home / Self Care | Payer: BLUE CROSS/BLUE SHIELD | Source: Ambulatory Visit | Attending: Internal Medicine | Admitting: Internal Medicine

## 2017-08-26 DIAGNOSIS — E859 Amyloidosis, unspecified: Secondary | ICD-10-CM | POA: Insufficient documentation

## 2017-08-26 MED ORDER — TECHNETIUM TC 99M PYROPHOSPHATE
20.0000 | Freq: Once | INTRAVENOUS | Status: DC
  Administered 2017-08-26: 20 via INTRAVENOUS

## 2017-09-02 ENCOUNTER — Other Ambulatory Visit: Payer: BLUE CROSS/BLUE SHIELD | Admitting: *Deleted

## 2017-09-02 ENCOUNTER — Encounter: Payer: Self-pay | Admitting: Cardiology

## 2017-09-02 ENCOUNTER — Encounter: Payer: Self-pay | Admitting: *Deleted

## 2017-09-02 ENCOUNTER — Ambulatory Visit (INDEPENDENT_AMBULATORY_CARE_PROVIDER_SITE_OTHER): Payer: BLUE CROSS/BLUE SHIELD | Admitting: Cardiology

## 2017-09-02 ENCOUNTER — Other Ambulatory Visit: Payer: Self-pay | Admitting: Cardiology

## 2017-09-02 VITALS — BP 104/70 | HR 66 | Ht 65.0 in | Wt 230.6 lb

## 2017-09-02 DIAGNOSIS — I428 Other cardiomyopathies: Secondary | ICD-10-CM | POA: Diagnosis not present

## 2017-09-02 DIAGNOSIS — I481 Persistent atrial fibrillation: Secondary | ICD-10-CM

## 2017-09-02 DIAGNOSIS — I10 Essential (primary) hypertension: Secondary | ICD-10-CM

## 2017-09-02 DIAGNOSIS — I4819 Other persistent atrial fibrillation: Secondary | ICD-10-CM

## 2017-09-02 DIAGNOSIS — I5022 Chronic systolic (congestive) heart failure: Secondary | ICD-10-CM

## 2017-09-02 NOTE — Progress Notes (Addendum)
Electrophysiology Office Note   Date:  09/02/2017   ID:  Patricia, Wagner 1951/10/23, MRN [id number]  PCP:  Biagio Borg, MD  Cardiologist:  [place] Primary Electrophysiologist:  Demaya Hardge Meredith Leeds, MD    Chief Complaint  Patient presents with  . Advice Only    Persistent Afib     History of Present Illness: Patricia Wagner is a 66 y.o. female who is being seen today for the evaluation of CHF, atrial fibrillation at the request of Biagio Borg, MD. Presenting today for electrophysiology evaluation. She has a history of persistent atrial fibrillation, nonischemic cardiomyopathy, CHF, hypertension, and hyperlipidemia. She has had atrial fibrillation since December 2017. She is had multiple cardioversions and has been loaded on amiodarone. She was admitted to the hospital in June 2018 in cardiogenic shock in the setting of persistent atrial fibrillation. EF was found to be 15% with severe by V dysfunction. She was started on amiodarone. She was successfully cardioverted to sinus rhythm after initiation of IV amiodarone.Since that time she has done well without recurrence of atrial fibrillation.    Today, she denies symptoms of palpitations, chest pain, shortness of breath, orthopnea, PND, lower extremity edema, claudication, dizziness, presyncope, syncope, bleeding, or neurologic sequela. The patient is tolerating medications without difficulties.    Past Medical History:  Diagnosis Date  . Arthritis   . Atrial fibrillation ([place])    a. s/p multiple cardioversions; failed tikosyn/sotalol.  Marland Kitchen CAD in native artery, s/p cardiac cath with non obstructive CAD 10/24/2016  . CHF (congestive heart failure) ([place])   . DIVERTICULITIS, HX OF 07/25/2007  . DIVERTICULOSIS, COLON 07/22/2007  . Dyspnea   . Edema, peripheral    a. chronic BLE edema, R>L. Prior trauma from dog attack and accident.  Marland Kitchen HYPERLIPIDEMIA 02/03/2008  . Hypersomnia    declines w/u  . Hypertension   . MENOPAUSAL DISORDER  01/09/2011  . Morbid obesity ([place]) 07/22/2007  . NICM (nonischemic cardiomyopathy) ([place]) 10/24/2016  . Raynaud's syndrome 07/22/2007  . Stroke ([place]) 2017  . THYROID NODULE, RIGHT 01/04/2010  . VITAMIN D DEFICIENCY 01/09/2011   Qualifier: Diagnosis of  By: [name], [name]    Past Surgical History:  Procedure Laterality Date  . CARDIAC CATHETERIZATION N/A 10/23/2016   Procedure: Left Heart Cath and Coronary Angiography;  Surgeon: [name], MD;  Location: [facility];  Service: Cardiovascular;  Laterality: N/A;  . CARDIOVERSION N/A 10/31/2016   Procedure: CARDIOVERSION;  Surgeon: [name], MD;  Location: [facility];  Service: Cardiovascular;  Laterality: N/A;  . CARDIOVERSION N/A 11/03/2016   Procedure: CARDIOVERSION;  Surgeon: [name], MD;  Location: [facility];  Service: Cardiovascular;  Laterality: N/A;  . CARDIOVERSION N/A 11/18/2016   Procedure: CARDIOVERSION;  Surgeon: [name], MD;  Location: [facility] ENDOSCOPY;  Service: Cardiovascular;  Laterality: N/A;  . CARDIOVERSION N/A 03/25/2017   Procedure: CARDIOVERSION;  Surgeon: [name], MD;  Location: [facility] ENDOSCOPY;  Service: Cardiovascular;  Laterality: N/A;  . CARDIOVERSION N/A 05/06/2017   Procedure: CARDIOVERSION;  Surgeon: [name], MD;  Location: [facility] ENDOSCOPY;  Service: Cardiovascular;  Laterality: N/A;  . CARDIOVERSION N/A 05/12/2017   Procedure: CARDIOVERSION;  Surgeon: [name], MD;  Location: [facility] ENDOSCOPY;  Service: Cardiovascular;  Laterality: N/A;  . CHOLECYSTECTOMY    . COLONOSCOPY    . PARTIAL HYSTERECTOMY     1 OVARY LEFT  . POLYPECTOMY    . TEE WITHOUT CARDIOVERSION N/A 10/31/2016  Procedure: TRANSESOPHAGEAL ECHOCARDIOGRAM (TEE);  Surgeon: [name], MD;  Location: [facility] ENDOSCOPY;  Service: Cardiovascular;  Laterality: N/A;  . TEE WITHOUT CARDIOVERSION N/A 03/25/2017   Procedure: TRANSESOPHAGEAL ECHOCARDIOGRAM (TEE);  Surgeon: [name], MD;  Location: [facility]  ENDOSCOPY;  Service: Cardiovascular;  Laterality: N/A;     Current Outpatient Prescriptions  Medication Sig Dispense Refill  . acetaminophen (TYLENOL) 325 MG tablet Take 650 mg by mouth every 6 (six) hours as needed (pain).     Marland Kitchen amiodarone (PACERONE) 200 MG tablet Take 1 tablet (200 mg total) by mouth daily. 30 tablet 6  . carvedilol (COREG) 3.125 MG tablet Take 1 tablet (3.125 mg total) by mouth 2 (two) times daily. 180 tablet 3  . diclofenac sodium (VOLTAREN) 1 % GEL Apply 4 g topically 4 (four) times daily as needed (As directed for skin).    . furosemide (LASIX) 40 MG tablet Take 1 tablet (40 mg total) by mouth daily. 90 tablet 3  . isosorbide-hydrALAZINE (BIDIL) 20-37.5 MG tablet Take 2 tablets by mouth 3 (three) times daily. 180 tablet 3  . losartan (COZAAR) 25 MG tablet Take 1 tablet (25 mg total) by mouth daily. 90 tablet 3  . ranolazine (RANEXA) 500 MG 12 hr tablet Take 1 tablet (500 mg total) by mouth 2 (two) times daily. 60 tablet 3  . rosuvastatin (CRESTOR) 10 MG tablet Take 1 tablet (10 mg total) by mouth daily. 90 tablet 3  . sacubitril-valsartan (ENTRESTO) 49-51 MG Take 1 tablet by mouth 2 (two) times daily. 60 tablet 3  . spironolactone (ALDACTONE) 25 MG tablet Take 1 tablet (25 mg total) by mouth daily. 90 tablet 3  . warfarin (COUMADIN) 3 MG tablet TAKE 1 TABLET BY MOUTH DAILY OR AS DIRECTED BY COUMADIN CLINIC 30 tablet 3   No current facility-administered medications for this visit.     Allergies:   Ace inhibitors   Social History:  The patient  reports that she has been smoking Cigarettes.  She has been smoking about 0.50 packs per day. She has never used smokeless tobacco. She reports that she drinks about 2.4 oz of alcohol per week . She reports that she does not use drugs.   Family History:  The patient's family history includes Asthma in her mother; Diabetes in her father; Heart disease in her father and sister; Lung disease in her sister.    ROS:  Please see the  history of present illness.   Otherwise, review of systems is positive for knee pain.   All other systems are reviewed and negative.    PHYSICAL EXAM: VS:  BP 104/70   Pulse 66   Ht 5\' 5"  (1.651 m)   Wt 230 lb 9.6 oz (104.6 kg)   BMI 38.37 kg/m  , BMI Body mass index is 38.37 kg/m. GEN: Well nourished, well developed, in no acute distress  HEENT: normal  Neck: no JVD, carotid bruits, or masses Cardiac: RRR; no murmurs, rubs, or gallops,no edema  Respiratory:  clear to auscultation bilaterally, normal work of breathing GI: soft, nontender, nondistended, + BS MS: no deformity or atrophy  Skin: warm and dry Neuro:  Strength and sensation are intact Psych: euthymic mood, full affect  EKG:  EKG is not ordered today. Personal review of the ekg ordered 08/19/17 shows SR, LAE, rate 62  Recent Labs: 04/29/2017: B Natriuretic Peptide 2,712.1 05/20/2017: Magnesium 2.0 07/21/2017: ALT 11; TSH 0.54 08/18/2017: Hemoglobin 13.6; Platelets 156 08/19/2017: BUN 45; Creatinine, Ser 2.11; Potassium 3.8;  Sodium 136    Lipid Panel     Component Value Date/Time   CHOL 165 07/21/2017 1516   TRIG 88.0 07/21/2017 1516   HDL 66.10 07/21/2017 1516   CHOLHDL 2 07/21/2017 1516   VLDL 17.6 07/21/2017 1516   LDLCALC 81 07/21/2017 1516   LDLDIRECT 121.6 03/15/2012 1646     Wt Readings from Last 3 Encounters:  09/02/17 230 lb 9.6 oz (104.6 kg)  08/19/17 229 lb (103.9 kg)  08/18/17 235 lb (106.6 kg)      Other studies Reviewed: Additional studies/ records that were reviewed today include: TTE 07/16/17  Review of the above records today demonstrates:  - Left ventricle: The cavity size was normal. There was severe   concentric hypertrophy. Systolic function was moderately to   severely reduced. The estimated ejection fraction was in the   range of 30% to 35%. Diffuse hypokinesis. - Mitral valve: There was mild to moderate regurgitation. - Left atrium: The atrium was severely dilated. - Right  ventricle: The cavity size was mildly dilated. Wall   thickness was normal. Systolic function was moderately reduced. - Right atrium: The atrium was moderately dilated. - Pericardium, extracardiac: A trivial pericardial effusion was   identified. Features were not consistent with tamponade   physiology.  LHC 10/23/16 1. Mild, non-obstructive coronary artery disease consistent with non-ischemic cardiomyopathy. 2. Mildly elevated left ventricular filling pressure.   ASSESSMENT AND PLAN:  1.  Persistent atrial fibrillation: Currently on amiodarone and warfarin. Has had multiple episodes of atrial fibrillation with rapid ventricular rates, at times causing cardiogenic shock. Her left atrium is unfortunately severely dilated thus reducing the chances of her retaining sinus rhythm with either medications were ablation alone. It is likely that she Leela Vanbrocklin need both to retain sinus rhythm. Risks and benefits of ablation were discussed. Risks include bleeding, tamponade, heart block, stroke, and damage to surrounding organs. She understands these risks and has agreed to the procedure.  This patients CHA2DS2-VASc Score and unadjusted Ischemic Stroke Rate (% per year) is equal to 3.2 % stroke rate/year from a score of 3  Above score calculated as 1 point each if present [CHF, HTN, DM, Vascular=MI/PAD/Aortic Plaque, Age if 65-74, or Female] Above score calculated as 2 points each if present [Age > 75, or Stroke/TIA/TE]   2. Nonischemic cardiomyopathy: Currently on optimal medical therapy. Ejection fraction has been consistently low. Would likely benefit from an ICD in the future. Would plan for atrial fibrillation ablation prior to ICD implantation.      Current medicines are reviewed at length with the patient today.   The patient does not have concerns regarding her medicines.  The following changes were made today:  none  Labs/ tests ordered today include:  Orders Placed This Encounter    Procedures  . CT CARDIAC MORPH/PULM VEIN W/CM&W/O CA SCORE  . CT CORONARY FRACTIONAL FLOW RESERVE DATA PREP  . CT CORONARY FRACTIONAL FLOW RESERVE FLUID ANALYSIS     Disposition:   FU with Garreth Burnsworth 2 months  Signed, Coco Sharpnack Meredith Leeds, MD  09/02/2017 3:04 PM     [facility] [address] [place] [address] [phone] (office) [phone] (fax)

## 2017-09-02 NOTE — Patient Instructions (Addendum)
Medication Instructions:  Your physician recommends that you continue on your current medications as directed. Please refer to the Current Medication list given to you today.  Labwork: None ordered  Testing/Procedures: Your physician has requested that you have cardiac CT -- this will be completed within 7 days PRIOR to your ablation on 12/11/2017. Cardiac computed tomography (CT) is a painless test that uses an x-ray machine to take clear, detailed pictures of your heart. For further information please visit [email or web address]. Please follow instruction listed below under special instructions.  The office will call you to arrange this testing, after we have pre certified with your insurance and it gets closer to  your procedure date.  Follow-Up: Your physician recommends that you schedule a follow-up appointment between 11/27/2017 - 12/09/2017 with Dr. Curt Bears  (for H&P and pre procedure lab work)  -- If you need a refill on your cardiac medications before your next appointment, please call your pharmacy. --  Thank you for choosing [facility]!!   Trinidad Curet, RN [phone]  Any Other Special Instructions Will Be Listed Below (If Applicable).  CT INSTRUCTIONS Please arrive at the [facility] main entrance of [facility] at xx:xx AM (30-45 minutes prior to test start time)  [facility] [address] [place], [place] [address] [phone]  Proceed to the [facility] Radiology Department (First Floor).  Please follow these instructions carefully (unless otherwise directed):  Hold all erectile dysfunction medications at least 48 hours prior to test.  On the Night Before the Test: . Drink plenty of water. . Do not consume any caffeinated/decaffeinated beverages or chocolate 12 hours prior to your test. . Do not take any antihistamines 12 hours prior to your test. . If you take Metformin do not take 24 hours prior to test. . If the patient has  contrast allergy: ? Patient will need a prescription for Prednisone and very clear instructions (as follows): 1. Prednisone 50 mg - take 13 hours prior to test 2. Take another Prednisone 50 mg 7 hours prior to test 3. Take another Prednisone 50 mg 1 hour prior to test 4. Take Benadryl 50 mg 1 hour prior to test . Patient must complete all four doses of above prophylactic medications. . Patient will need a ride after test due to Benadryl.  On the Day of the Test: . Drink plenty of water. Do not drink any water within one hour of the test. . Do not eat any food 4 hours prior to the test. . You may take your regular medications prior to the test. . IF NOT ON A BETA BLOCKER - Take 50 mg of lopressor (metoprolol) one hour before the test. . HOLD Furosemide morning of the test.  After the Test: . Drink plenty of water. . After receiving IV contrast, you may experience a mild flushed feeling. This is normal. . On occasion, you may experience a mild rash up to 24 hours after the test. This is not dangerous. If this occurs, you can take Benadryl 25 mg and increase your fluid intake. . If you experience trouble breathing, this can be serious. If it is severe call 911 IMMEDIATELY. If it is mild, please call our office. . If you take any of these medications: Glipizide/Metformin, Avandament, Glucavance, please do not take 48 hours after completing test.    Cardiac Ablation Cardiac ablation is a procedure to disable (ablate) a small amount of heart tissue in very specific places. The heart has many electrical  connections. Sometimes these connections are abnormal and can cause the heart to beat very fast or irregularly. Ablating some of the problem areas can improve the heart rhythm or return it to normal. Ablation may be done for people who:  Have Wolff-Parkinson-White syndrome.  Have fast heart rhythms (tachycardia).  Have taken medicines for an abnormal heart rhythm (arrhythmia) that were not  effective or caused side effects.  Have a high-risk heartbeat that may be life-threatening.  During the procedure, a small incision is made in the neck or the groin, and a long, thin, flexible tube (catheter) is inserted into the incision and moved to the heart. Small devices (electrodes) on the tip of the catheter will send out electrical currents. A type of X-ray (fluoroscopy) will be used to help guide the catheter and to provide images of the heart. Tell a health care provider about:  Any allergies you have.  All medicines you are taking, including vitamins, herbs, eye drops, creams, and over-the-counter medicines.  Any problems you or family members have had with anesthetic medicines.  Any blood disorders you have.  Any surgeries you have had.  Any medical conditions you have, such as kidney failure.  Whether you are pregnant or may be pregnant. What are the risks? Generally, this is a safe procedure. However, problems may occur, including:  Infection.  Bruising and bleeding at the catheter insertion site.  Bleeding into the chest, especially into the sac that surrounds the heart. This is a serious complication.  Stroke or blood clots.  Damage to other structures or organs.  Allergic reaction to medicines or dyes.  Need for a permanent pacemaker if the normal electrical system is damaged. A pacemaker is a small computer that sends electrical signals to the heart and helps your heart beat normally.  The procedure not being fully effective. This may not be recognized until months later. Repeat ablation procedures are sometimes required.  What happens before the procedure?  Follow instructions from your health care provider about eating or drinking restrictions.  Ask your health care provider about: ? Changing or stopping your regular medicines. This is especially important if you are taking diabetes medicines or blood thinners. ? Taking medicines such as aspirin and  ibuprofen. These medicines can thin your blood. Do not take these medicines before your procedure if your health care provider instructs you not to.  Plan to have someone take you home from the hospital or clinic.  If you will be going home right after the procedure, plan to have someone with you for 24 hours. What happens during the procedure?  To lower your risk of infection: ? Your health care team will wash or sanitize their hands. ? Your skin will be washed with soap. ? Hair may be removed from the incision area.  An IV tube will be inserted into one of your veins.  You will be given a medicine to help you relax (sedative).  The skin on your neck or groin will be numbed.  An incision will be made in your neck or your groin.  A needle will be inserted through the incision and into a large vein in your neck or groin.  A catheter will be inserted into the needle and moved to your heart.  Dye may be injected through the catheter to help your surgeon see the area of the heart that needs treatment.  Electrical currents will be sent from the catheter to ablate heart tissue in desired areas. There are three  types of energy that may be used to ablate heart tissue: ? Heat (radiofrequency energy). ? Laser energy. ? Extreme cold (cryoablation).  When the necessary tissue has been ablated, the catheter will be removed.  Pressure will be held on the catheter insertion area to prevent excessive bleeding.  A bandage (dressing) will be placed over the catheter insertion area. The procedure may vary among health care providers and hospitals. What happens after the procedure?  Your blood pressure, heart rate, breathing rate, and blood oxygen level will be monitored until the medicines you were given have worn off.  Your catheter insertion area will be monitored for bleeding. You will need to lie still for a few hours to ensure that you do not bleed from the catheter insertion area.  Do  not drive for 24 hours or as long as directed by your health care provider. Summary  Cardiac ablation is a procedure to disable (ablate) a small amount of heart tissue in very specific places. Ablating some of the problem areas can improve the heart rhythm or return it to normal.  During the procedure, electrical currents will be sent from the catheter to ablate heart tissue in desired areas. This information is not intended to replace advice given to you by your health care provider. Make sure you discuss any questions you have with your health care provider. Document Released: 03/29/2009 Document Revised: 09/29/2016 Document Reviewed: 09/29/2016 Elsevier Interactive Patient Education  [employer].

## 2017-09-02 NOTE — Addendum Note (Signed)
Addended by: Stanton Kidney on: 09/02/2017 02:59 PM   Modules accepted: Orders

## 2017-09-03 LAB — BASIC METABOLIC PANEL
BUN/Creatinine Ratio: 19 (ref 12–28)
BUN: 41 mg/dL — AB (ref 8–27)
CHLORIDE: 101 mmol/L (ref 96–106)
CO2: 20 mmol/L (ref 20–29)
CREATININE: 2.19 mg/dL — AB (ref 0.57–1.00)
Calcium: 9.3 mg/dL (ref 8.7–10.3)
GFR calc Af Amer: 27 mL/min/{1.73_m2} — ABNORMAL LOW (ref >59)
GFR calc non Af Amer: 23 mL/min/{1.73_m2} — ABNORMAL LOW (ref >59)
GLUCOSE: 84 mg/dL (ref 65–99)
Potassium: 4.8 mmol/L (ref 3.5–5.2)
Sodium: 140 mmol/L (ref 134–144)

## 2017-09-10 ENCOUNTER — Other Ambulatory Visit: Payer: Self-pay | Admitting: Physician Assistant

## 2017-09-11 ENCOUNTER — Other Ambulatory Visit (HOSPITAL_COMMUNITY): Payer: Self-pay | Admitting: Pharmacist

## 2017-09-11 MED ORDER — ISOSORB DINITRATE-HYDRALAZINE 20-37.5 MG PO TABS: 2.0000 | ORAL_TABLET | Freq: Three times a day (TID) | ORAL | 11 refills | Status: DC

## 2017-09-14 ENCOUNTER — Other Ambulatory Visit (HOSPITAL_COMMUNITY): Payer: Self-pay | Admitting: Internal Medicine

## 2017-09-15 ENCOUNTER — Ambulatory Visit (INDEPENDENT_AMBULATORY_CARE_PROVIDER_SITE_OTHER): Payer: BLUE CROSS/BLUE SHIELD | Admitting: Pharmacist Clinician (PhC)/ Clinical Pharmacy Specialist

## 2017-09-15 DIAGNOSIS — I480 Paroxysmal atrial fibrillation: Secondary | ICD-10-CM

## 2017-09-15 DIAGNOSIS — Z7901 Long term (current) use of anticoagulants: Secondary | ICD-10-CM | POA: Diagnosis not present

## 2017-09-15 LAB — POCT INR: INR: 1.7

## 2017-09-17 ENCOUNTER — Other Ambulatory Visit: Payer: Self-pay | Admitting: *Deleted

## 2017-09-17 DIAGNOSIS — Z006 Encounter for examination for normal comparison and control in clinical research program: Secondary | ICD-10-CM

## 2017-09-17 MED ORDER — STUDY - INVESTIGATIONAL MEDICATION: 1.0000 | Freq: Two times a day (BID) | 99 refills | Status: DC

## 2017-09-17 NOTE — Progress Notes (Signed)
Patient is present for D1-Randomization into the [employer] Heart Failure Study. States she feels well and is happy to participate in the study. Denied any ACS symptoms. Denied any adverse events. The study was reviewed and all questions answered. All study procedures completed without event. IP dispensed and administration instructions given; patient verbalized understanding. Patient will return in 2 weeks for research study visit.

## 2017-09-29 ENCOUNTER — Encounter: Payer: Self-pay | Admitting: *Deleted

## 2017-09-29 ENCOUNTER — Ambulatory Visit (INDEPENDENT_AMBULATORY_CARE_PROVIDER_SITE_OTHER): Payer: BLUE CROSS/BLUE SHIELD | Admitting: Pharmacist Clinician (PhC)/ Clinical Pharmacy Specialist

## 2017-09-29 DIAGNOSIS — I480 Paroxysmal atrial fibrillation: Secondary | ICD-10-CM | POA: Diagnosis not present

## 2017-09-29 DIAGNOSIS — Z7901 Long term (current) use of anticoagulants: Secondary | ICD-10-CM

## 2017-09-29 DIAGNOSIS — Z006 Encounter for examination for normal comparison and control in clinical research program: Secondary | ICD-10-CM

## 2017-09-29 LAB — POCT INR: INR: 1.8

## 2017-10-01 NOTE — Progress Notes (Signed)
RESEARCH ENCOUNTER  Patient ID: Patricia Wagner  DOB: [birth year]  [name] presented to the [facility] for the Week 2 Visit of the [facility].  No signs/symptoms of ACS since the last visit. Subject compliant with IP.   Patient will follow up with Research Clinic in 2 weeks.

## 2017-10-07 ENCOUNTER — Other Ambulatory Visit: Payer: Self-pay | Admitting: Internal Medicine

## 2017-10-08 NOTE — Progress Notes (Signed)
HPI: Follow-up atrial fibrillation. Patient admitted with new-onset atrial fibrillation November 2017. Echo showed ejection fraction 20-25%, biatrial enlargement and mild mitral regurgitation. Cardiac catheterization November 2017 showed no obstructive coronary disease. Patient had difficulties with recurrent atrial fibrillation undergoing multiple cardioversion attempts. She had bradycardia on sotalol. Patient had TEE guided cardioversion on May 2. She was found to have severe LV dysfunction, severe RV dysfunction, mild to moderate mitral regurgitation and moderate tricuspid regurgitation. Patient was admitted June 2018 with congestive heart failure and hypotension. She was placed on amiodarone drip and also required dobutamine. She ultimately had successful cardioversion. Ranexa was added for additional antiarrhythmic benefit. Last echocardiogram August 2018 showed ejection fraction 30-35%,severe left ventricular hypertrophy, mild to moderate mitral regurgitation, biatrial enlargement and moderately reduced RV function. Fat pad bx negative. Nuclear study 10/18 felt equivocal but not favored for amyloid. Patient has been scheduled for atrial fibrillation ablation January 2019 as she has had cardiogenic shock with atrial fibrillation previously. Since last seen, patient has dyspnea with more vigorous activities. Not routine activities. No orthopnea, PND and pedal edema is well controlled. No chest pain. Occasional mild dizziness but no complaints today.No syncope.  Current Outpatient Medications  Medication Sig Dispense Refill  . acetaminophen (TYLENOL) 325 MG tablet Take 650 mg by mouth every 6 (six) hours as needed (pain).     Marland Kitchen amiodarone (PACERONE) 200 MG tablet Take 1 tablet (200 mg total) by mouth daily. 30 tablet 6  . diclofenac sodium (VOLTAREN) 1 % GEL Apply 4 g topically 4 (four) times daily as needed (As directed for skin).    . furosemide (LASIX) 40 MG tablet Take 1 tablet (40 mg total)  by mouth daily. 90 tablet 3  . Investigational - Study Medication Take 1 tablet by mouth 2 (two) times daily. Study name: Galactic HF Study  Additional study details: Omecamtiv Mecarbil or Placebo 1 each PRN  . isosorbide-hydrALAZINE (BIDIL) 20-37.5 MG tablet Take 2 tablets by mouth 3 (three) times daily. 180 tablet 11  . losartan (COZAAR) 25 MG tablet Take 1 tablet (25 mg total) by mouth daily. 90 tablet 3  . ranolazine (RANEXA) 500 MG 12 hr tablet Take 1 tablet (500 mg total) by mouth 2 (two) times daily. 60 tablet 5  . rosuvastatin (CRESTOR) 10 MG tablet TAKE 1 TABLET (10 MG TOTAL) BY MOUTH DAILY. 90 tablet 2  . sacubitril-valsartan (ENTRESTO) 49-51 MG Take 1 tablet by mouth 2 (two) times daily. 60 tablet 3  . spironolactone (ALDACTONE) 25 MG tablet Take 1 tablet (25 mg total) by mouth daily. 90 tablet 3  . warfarin (COUMADIN) 3 MG tablet TAKE 1 TABLET BY MOUTH DAILY OR AS DIRECTED BY COUMADIN CLINIC 30 tablet 3  . carvedilol (COREG) 3.125 MG tablet Take 1 tablet (3.125 mg total) by mouth 2 (two) times daily. 180 tablet 3   No current facility-administered medications for this visit.      Past Medical History:  Diagnosis Date  . Arthritis   . Atrial fibrillation ([place])    a. s/p multiple cardioversions; failed tikosyn/sotalol.  Marland Kitchen CAD in native artery, s/p cardiac cath with non obstructive CAD 10/24/2016  . CHF (congestive heart failure) ([place])   . DIVERTICULITIS, HX OF 07/25/2007  . DIVERTICULOSIS, COLON 07/22/2007  . Dyspnea   . Edema, peripheral    a. chronic BLE edema, R>L. Prior trauma from dog attack and accident.  Marland Kitchen HYPERLIPIDEMIA 02/03/2008  . Hypersomnia    declines w/u  . Hypertension   .  MENOPAUSAL DISORDER 01/09/2011  . Morbid obesity ([place]) 07/22/2007  . NICM (nonischemic cardiomyopathy) ([place]) 10/24/2016  . Raynaud's syndrome 07/22/2007  . Stroke ([place]) 2017  . THYROID NODULE, RIGHT 01/04/2010  . VITAMIN D DEFICIENCY 01/09/2011   Qualifier: Diagnosis of  By: [name], [name]       Past Surgical History:  Procedure Laterality Date  . CARDIAC CATHETERIZATION N/A 10/23/2016   Procedure: Left Heart Cath and Coronary Angiography;  Surgeon: [name], MD;  Location: [facility];  Service: Cardiovascular;  Laterality: N/A;  . CARDIOVERSION N/A 10/31/2016   Procedure: CARDIOVERSION;  Surgeon: [name], MD;  Location: [place];  Service: Cardiovascular;  Laterality: N/A;  . CARDIOVERSION N/A 11/03/2016   Procedure: CARDIOVERSION;  Surgeon: [name], MD;  Location: [place];  Service: Cardiovascular;  Laterality: N/A;  . CARDIOVERSION N/A 11/18/2016   Procedure: CARDIOVERSION;  Surgeon: [name], MD;  Location: [facility] ENDOSCOPY;  Service: Cardiovascular;  Laterality: N/A;  . CARDIOVERSION N/A 03/25/2017   Procedure: CARDIOVERSION;  Surgeon: [name], MD;  Location: [facility] ENDOSCOPY;  Service: Cardiovascular;  Laterality: N/A;  . CARDIOVERSION N/A 05/06/2017   Procedure: CARDIOVERSION;  Surgeon: [name], MD;  Location: [facility] ENDOSCOPY;  Service: Cardiovascular;  Laterality: N/A;  . CARDIOVERSION N/A 05/12/2017   Procedure: CARDIOVERSION;  Surgeon: [name], MD;  Location: [facility] ENDOSCOPY;  Service: Cardiovascular;  Laterality: N/A;  . CHOLECYSTECTOMY    . COLONOSCOPY    . PARTIAL HYSTERECTOMY     1 OVARY LEFT  . POLYPECTOMY    . TEE WITHOUT CARDIOVERSION N/A 10/31/2016   Procedure: TRANSESOPHAGEAL ECHOCARDIOGRAM (TEE);  Surgeon: [name], MD;  Location: [facility] ENDOSCOPY;  Service: Cardiovascular;  Laterality: N/A;  . TEE WITHOUT CARDIOVERSION N/A 03/25/2017   Procedure: TRANSESOPHAGEAL ECHOCARDIOGRAM (TEE);  Surgeon: [name], MD;  Location[facility] ENDOSCOPY;  Service: Cardiovascular;  Laterality: N/A;    Social History   Socioeconomic History  . Marital status: Single    Spouse name: Not on file  . Number of children: 1  . Years of education: Not on file  . Highest education level: Not on file  Social Needs   . Financial resource strain: Not on file  . Food insecurity - worry: Not on file  . Food insecurity - inability: Not on file  . Transportation needs - medical: Not on file  . Transportation needs - non-medical: Not on file  Occupational History  . Occupation: Academic librarian   Tobacco Use  . Smoking status: Current Every Day Smoker    Packs/day: 0.50    Types: Cigarettes    Last attempt to quit: 10/23/2016    Years since quitting: 0.9  . Smokeless tobacco: Never Used  . Tobacco comment: Smoked for 50 years  Substance and Sexual Activity  . Alcohol use: Yes    Alcohol/week: 2.4 oz    Types: 4 Cans of beer per week    Comment: Intermittent  . Drug use: No  . Sexual activity: Not on file  Other Topics Concern  . Not on file  Social History Narrative  . Not on file    Family History  Problem Relation Age of Onset  . Asthma Mother   . Diabetes Father   . Heart disease Father        Died of presumed heart attack - 4s  . Lung disease Sister   . Heart disease Sister        Twin sister has  heart issue, unclear what kind  . Thyroid disease Neg Hx     ROS: no fevers or chills, productive cough, hemoptysis, dysphasia, odynophagia, melena, hematochezia, dysuria, hematuria, rash, seizure activity, orthopnea, PND, pedal edema, claudication. Remaining systems are negative.  Physical Exam: Well-developed well-nourished in no acute distress.  Skin is warm and dry.  HEENT is normal.  Neck is supple.  Chest is clear to auscultation with normal expansion.  Cardiovascular exam is regular rate and rhythm.  Abdominal exam nontender or distended. No masses palpated. Extremities show no edema. neuro grossly intact  ECG- sinus rhythm with occasional PVC. No ST changes.personally reviewed  A/P  1 chronic systolic congestive heart failure-patient's volume status appears to be stable. Continue present dose of diuretic. We discussed the importance of low sodium diet and fluid restriction.  Continue daily weights with additional 40 mg of Lasix for weight gain of 2-3 pounds. Check potassium and renal function.  2 paroxysmal atrial fibrillation-patient does not tolerate atrial fibrillation. She is scheduled for ablation in January. Continue amiodarone. Check chest x-ray. TSH and liver functions normal August 2018. Continue Coumadin. Goal INR 2-3.  3 cardiomyopathy-continue entresto and coreg. Note her blood pressure in both arms today is systolic 84. She appears to be taking both Cozaar and entresto. Discontinue Cozaar. Hold bidil today and then decrease to one tablet 3 times a day. I will have her return later this week to see one of our assistants to make sure that blood pressure has improved. Note she was asymptomatic in the office with no dizziness. No recent syncope. Cardiomyopathy previously felt secondary to tachycardia. However LV function has not improved on last echo. She is followed by electrophysiology and tentative plans would be for ICD after atrial fibrillation ablation.  4 hypertension-blood pressure low; med adjustment as outlined above.  5 hyperlipidemia-continue statin.  6 chronic stage III kidney disease-check potassium and renal function.  7 tobacco abuse-Pt counseled on discontinuing.  Kirk Ruths, MD

## 2017-10-09 ENCOUNTER — Encounter (HOSPITAL_COMMUNITY): Payer: Self-pay | Admitting: *Deleted

## 2017-10-09 NOTE — Progress Notes (Signed)
Received Disability forms from [employer] on 09/21/2017.  Forms completed/signed and faxed today to [phone].  Original forms will be scanned to patient's electronic medical record.

## 2017-10-13 ENCOUNTER — Encounter: Payer: BLUE CROSS/BLUE SHIELD | Admitting: *Deleted

## 2017-10-13 VITALS — BP 103/57 | HR 60 | Wt 233.0 lb

## 2017-10-13 DIAGNOSIS — Z006 Encounter for examination for normal comparison and control in clinical research program: Secondary | ICD-10-CM

## 2017-10-13 NOTE — Progress Notes (Signed)
RESEARCH ENCOUNTER  Patient ID: Patricia Wagner  DOB: 09/25/1951  [name] presented to the [facility] for the Week 4 visit of the [facility].  No signs/symptoms of ACS since the last visit. Subject compliant with IP, IP returned, and additional IP dispensed.  Re-educated on taking one pill in the morning and one in the evening to improve compliance.    [facility].

## 2017-10-20 ENCOUNTER — Encounter: Payer: Self-pay | Admitting: Cardiology

## 2017-10-20 ENCOUNTER — Ambulatory Visit (INDEPENDENT_AMBULATORY_CARE_PROVIDER_SITE_OTHER): Payer: BLUE CROSS/BLUE SHIELD | Admitting: Pharmacist

## 2017-10-20 ENCOUNTER — Ambulatory Visit (INDEPENDENT_AMBULATORY_CARE_PROVIDER_SITE_OTHER): Payer: BLUE CROSS/BLUE SHIELD | Admitting: Cardiology

## 2017-10-20 VITALS — BP 80/48 | HR 63 | Ht 65.0 in | Wt 230.0 lb

## 2017-10-20 DIAGNOSIS — I10 Essential (primary) hypertension: Secondary | ICD-10-CM | POA: Diagnosis not present

## 2017-10-20 DIAGNOSIS — I480 Paroxysmal atrial fibrillation: Secondary | ICD-10-CM | POA: Diagnosis not present

## 2017-10-20 DIAGNOSIS — I5022 Chronic systolic (congestive) heart failure: Secondary | ICD-10-CM

## 2017-10-20 DIAGNOSIS — I428 Other cardiomyopathies: Secondary | ICD-10-CM

## 2017-10-20 DIAGNOSIS — Z7901 Long term (current) use of anticoagulants: Secondary | ICD-10-CM | POA: Diagnosis not present

## 2017-10-20 DIAGNOSIS — E7800 Pure hypercholesterolemia, unspecified: Secondary | ICD-10-CM | POA: Diagnosis not present

## 2017-10-20 LAB — POCT INR: INR: 1.4

## 2017-10-20 MED ORDER — ISOSORB DINITRATE-HYDRALAZINE 20-37.5 MG PO TABS: 1.0000 | ORAL_TABLET | Freq: Three times a day (TID) | ORAL | 11 refills | Status: DC

## 2017-10-20 NOTE — Patient Instructions (Addendum)
Medication Instructions:   STOP LOSARTAN  DECREASE BIDIL TO 1 TABLET THREE TIMES DAILY  Labwork:  Your physician recommends that you HAVE LAB WORK TODAY  Testing/Procedures:  A chest x-ray takes a picture of the organs and structures inside the chest, including the heart, lungs, and blood vessels. This test can show several things, including, whether the heart is enlarges; whether fluid is building up in the lungs; and whether pacemaker / defibrillator leads are still in place. AT [place]:  Your physician recommends that you schedule a follow-up appointment in: Thursday OR Friday THIS WEEK WITH APP  Your physician recommends that you schedule a follow-up appointment in: 6-8 [place]

## 2017-10-21 ENCOUNTER — Telehealth: Payer: Self-pay | Admitting: *Deleted

## 2017-10-21 DIAGNOSIS — N183 Chronic kidney disease, stage 3 unspecified: Secondary | ICD-10-CM

## 2017-10-21 LAB — BASIC METABOLIC PANEL
BUN / CREAT RATIO: 22 (ref 12–28)
BUN: 51 mg/dL — ABNORMAL HIGH (ref 8–27)
CO2: 22 mmol/L (ref 20–29)
CREATININE: 2.29 mg/dL — AB (ref 0.57–1.00)
Calcium: 9.2 mg/dL (ref 8.7–10.3)
Chloride: 105 mmol/L (ref 96–106)
GFR, EST AFRICAN AMERICAN: 25 mL/min/{1.73_m2} — AB (ref >59)
GFR, EST NON AFRICAN AMERICAN: 22 mL/min/{1.73_m2} — AB (ref >59)
GLUCOSE: 103 mg/dL — AB (ref 65–99)
Potassium: 4.6 mmol/L (ref 3.5–5.2)
SODIUM: 141 mmol/L (ref 134–144)

## 2017-10-21 NOTE — Telephone Encounter (Signed)
Spoke to patient, she states she just received call from [place] who communicated the recommendations and set her up for return OV Friday with [place]. Advised to call back if she has any further needs.

## 2017-10-21 NOTE — Telephone Encounter (Signed)
Spoke with pt, aware of results. .Lab orders mailed to the pt. Follow up scheduled as per AVS.

## 2017-10-21 NOTE — Telephone Encounter (Addendum)
-----   Message from [name], MD sent at 10/21/2017  7:21 AM EST ----- Change lasix to 40 mg daily; take additional 40 mg daily for weight gain of 2-3 lbs in one day; bmet 2 weeks Kirk Ruths   Left message for pt to call

## 2017-10-21 NOTE — Telephone Encounter (Signed)
Discussed with dr [name], patient called and instructed to hold furosemide Thursday and Friday. She is to restart lasix on Saturday at 20 mg once daily. She will need labs in 2 weeks.

## 2017-10-21 NOTE — Telephone Encounter (Signed)
Follow up     Patient was returning Patricia Wagner

## 2017-10-21 NOTE — Telephone Encounter (Signed)
Follow up    Patient returning call. Please call

## 2017-10-22 ENCOUNTER — Encounter: Payer: Self-pay | Admitting: *Deleted

## 2017-10-22 ENCOUNTER — Ambulatory Visit (HOSPITAL_COMMUNITY)
Admission: RE | Admit: 2017-10-22 | Discharge: 2017-10-22 | Disposition: A | Discharge status: Home / Self Care | Payer: BLUE CROSS/BLUE SHIELD | Source: Ambulatory Visit | Attending: Cardiology | Admitting: Cardiology

## 2017-10-22 DIAGNOSIS — I480 Paroxysmal atrial fibrillation: Secondary | ICD-10-CM | POA: Diagnosis present

## 2017-10-22 DIAGNOSIS — Z9889 Other specified postprocedural states: Secondary | ICD-10-CM | POA: Insufficient documentation

## 2017-10-22 DIAGNOSIS — J90 Pleural effusion, not elsewhere classified: Secondary | ICD-10-CM | POA: Insufficient documentation

## 2017-10-22 DIAGNOSIS — I517 Cardiomegaly: Secondary | ICD-10-CM | POA: Insufficient documentation

## 2017-10-22 DIAGNOSIS — J9811 Atelectasis: Secondary | ICD-10-CM | POA: Insufficient documentation

## 2017-10-22 NOTE — Progress Notes (Signed)
Cardiology Office Note    Date:  10/24/2017   ID:  Patricia Wagner, DOB March 21, 1951, MRN [id number]  PCP:  Biagio Borg, MD  Cardiologist: Dr. [name]   Chief Complaint  Patient presents with  . Follow-up    low blood pressure    History of Present Illness:    Patricia Wagner is a 66 y.o. female with past medical history of PAF (s/p multiple DCCV's, has failed Tikosyn and Sotalol, on Coumadin for anticoagulation), chronic systolic CHF (EF 71-69% by echo in 06/2017), nonischemic cardiomyopathy (cath in 09/2016 showing normal cors), HTN, and HLD who presents to the office today for follow-up of her blood pressure.   She was recently evaluated by Dr. [name] on 10/20/2017 and reported more dyspnea on exertion but denied any recent chest pain or palpitations. Her volume status was at baseline and she was continued on Lasix 40mg  daily at that time. SBP was soft in the 80's but it was thought she had been taking both Losartan and Entresto, therefore Losartan was discontinued and Bi-dil was decreased to one tablet TID. Labs were checked at that time and showed an elevated creatinine at 2.29, therefore she was informed to hold Lasix for two days then restart at 20mg  daily on Saturday.   In talking with the patient today, she reports feeling well since her recent office visit. She does not have a blood pressure cuff at home but this has improved to 93/61 during today's visit. She had in fact stopped taking Losartan months ago and was not on the medication at the time of her recent visit.  She denies any recent symptoms of lightheadedness, dizziness, or presyncope. No recent chest discomfort, dyspnea on exertion, orthopnea, or PND. Has slight lower extremity edema which is always present by her report. Her weight has increased on our office scales from 230 lbs to 236 lbs but she reports weight has been stable at 232 lbs on her home scales. Does note some dietary indiscretion over the past few days as she  recently consumed several doughnuts.  Past Medical History:  Diagnosis Date  . Arthritis   . Atrial fibrillation ([place])    a. s/p multiple cardioversions; failed tikosyn/sotalol.  Marland Kitchen CAD in native artery, s/p cardiac cath with non obstructive CAD 10/24/2016  . CHF (congestive heart failure) ([place])   . DIVERTICULITIS, HX OF 07/25/2007  . DIVERTICULOSIS, COLON 07/22/2007  . Dyspnea   . Edema, peripheral    a. chronic BLE edema, R>L. Prior trauma from dog attack and accident.  Marland Kitchen HYPERLIPIDEMIA 02/03/2008  . Hypersomnia    declines w/u  . Hypertension   . MENOPAUSAL DISORDER 01/09/2011  . Morbid obesity ([place]) 07/22/2007  . NICM (nonischemic cardiomyopathy) ([place]) 10/24/2016  . Raynaud's syndrome 07/22/2007  . Stroke ([place]) 2017  . THYROID NODULE, RIGHT 01/04/2010  . VITAMIN D DEFICIENCY 01/09/2011   Qualifier: Diagnosis of  By: [name], [name]     Past Surgical History:  Procedure Laterality Date  . CARDIAC CATHETERIZATION N/A 10/23/2016   Procedure: Left Heart Cath and Coronary Angiography;  Surgeon: [name], MD;  Location: [facility];  Service: Cardiovascular;  Laterality: N/A;  . CARDIOVERSION N/A 10/31/2016   Procedure: CARDIOVERSION;  Surgeon: [name], MD;  Location: [facility] ENDOSCOPY;  Service: Cardiovascular;  Laterality: N/A;  . CARDIOVERSION N/A 11/03/2016   Procedure: CARDIOVERSION;  Surgeon: [name], MD;  Location: [place];  Service: Cardiovascular;  Laterality: N/A;  . CARDIOVERSION N/A  11/18/2016   Procedure: CARDIOVERSION;  Surgeon: [name], MD;  Location: [place];  Service: Cardiovascular;  Laterality: N/A;  . CARDIOVERSION N/A 03/25/2017   Procedure: CARDIOVERSION;  Surgeon: [name], MD;  Location: [facility] ENDOSCOPY;  Service: Cardiovascular;  Laterality: N/A;  . CARDIOVERSION N/A 05/06/2017   Procedure: CARDIOVERSION;  Surgeon: [name], MD;  Location: [facility] ENDOSCOPY;  Service: Cardiovascular;  Laterality: N/A;  .  CARDIOVERSION N/A 05/12/2017   Procedure: CARDIOVERSION;  Surgeon: [name], MD;  Location: [facility] ENDOSCOPY;  Service: Cardiovascular;  Laterality: N/A;  . CHOLECYSTECTOMY    . COLONOSCOPY    . PARTIAL HYSTERECTOMY     1 OVARY LEFT  . POLYPECTOMY    . TEE WITHOUT CARDIOVERSION N/A 10/31/2016   Procedure: TRANSESOPHAGEAL ECHOCARDIOGRAM (TEE);  Surgeon: [name], MD;  Location: [facility] ENDOSCOPY;  Service: Cardiovascular;  Laterality: N/A;  . TEE WITHOUT CARDIOVERSION N/A 03/25/2017   Procedure: TRANSESOPHAGEAL ECHOCARDIOGRAM (TEE);  Surgeon: [name], MD;  Location: [facility] ENDOSCOPY;  Service: Cardiovascular;  Laterality: N/A;    Current Medications: Outpatient Medications Prior to Visit  Medication Sig Dispense Refill  . acetaminophen (TYLENOL) 325 MG tablet Take 650 mg by mouth every 6 (six) hours as needed (pain).     Marland Kitchen amiodarone (PACERONE) 200 MG tablet Take 1 tablet (200 mg total) by mouth daily. 30 tablet 6  . carvedilol (COREG) 3.125 MG tablet Take 1 tablet (3.125 mg total) by mouth 2 (two) times daily. 180 tablet 3  . furosemide (LASIX) 40 MG tablet Take 1 tablet (40 mg total) by mouth daily. 90 tablet 3  . Investigational - Study Medication Take 1 tablet by mouth 2 (two) times daily. Study name: Galactic HF Study  Additional study details: Omecamtiv Mecarbil or Placebo 1 each PRN  . isosorbide-hydrALAZINE (BIDIL) 20-37.5 MG tablet Take 1 tablet by mouth 3 (three) times daily. 180 tablet 11  . ranolazine (RANEXA) 500 MG 12 hr tablet Take 1 tablet (500 mg total) by mouth 2 (two) times daily. 60 tablet 5  . rosuvastatin (CRESTOR) 10 MG tablet TAKE 1 TABLET (10 MG TOTAL) BY MOUTH DAILY. 90 tablet 2  . sacubitril-valsartan (ENTRESTO) 49-51 MG Take 1 tablet by mouth 2 (two) times daily. 60 tablet 3  . warfarin (COUMADIN) 3 MG tablet TAKE 1 TABLET BY MOUTH DAILY OR AS DIRECTED BY COUMADIN CLINIC 30 tablet 3  . diclofenac sodium (VOLTAREN) 1 % GEL Apply 4 g topically 4 (four) times  daily as needed (As directed for skin).    Marland Kitchen spironolactone (ALDACTONE) 25 MG tablet Take 1 tablet (25 mg total) by mouth daily. 90 tablet 3   No facility-administered medications prior to visit.      Allergies:   Ace inhibitors   Social History   Socioeconomic History  . Marital status: Single    Spouse name: None  . Number of children: 1  . Years of education: None  . Highest education level: None  Social Needs  . Financial resource strain: None  . Food insecurity - worry: None  . Food insecurity - inability: None  . Transportation needs - medical: None  . Transportation needs - non-medical: None  Occupational History  . Occupation: Academic librarian   Tobacco Use  . Smoking status: Current Every Day Smoker    Packs/day: 0.50    Types: Cigarettes    Last attempt to quit: 10/23/2016    Years since quitting: 1.0  . Smokeless tobacco: Never Used  . Tobacco  comment: Smoked for 50 years  Substance and Sexual Activity  . Alcohol use: Yes    Alcohol/week: 2.4 oz    Types: 4 Cans of beer per week    Comment: Intermittent  . Drug use: No  . Sexual activity: None  Other Topics Concern  . None  Social History Narrative  . None     Family History:  The patient's family history includes Asthma in her mother; Diabetes in her father; Heart disease in her father and sister; Lung disease in her sister.   Review of Systems:   Please see the history of present illness.     General:  No chills, fever, night sweats or weight changes.  Cardiovascular:  No chest pain, dyspnea on exertion, orthopnea, palpitations, paroxysmal nocturnal dyspnea. Positive for lower extremity edema.  Dermatological: No rash, lesions/masses Respiratory: No cough, dyspnea Urologic: No hematuria, dysuria Abdominal:   No nausea, vomiting, diarrhea, bright red blood per rectum, melena, or hematemesis Neurologic:  No visual changes, wkns, changes in mental status. All other systems reviewed and are otherwise negative  except as noted above.   Physical Exam:    VS:  BP 93/61   Pulse 76   Ht 5\' 5"  (1.651 m)   Wt 236 lb 3.2 oz (107.1 kg)   BMI 39.31 kg/m    General: Well developed, well nourished [place] American female appearing in no acute distress. Head: Normocephalic, atraumatic, sclera non-icteric, no xanthomas, nares are without discharge.  Neck: No carotid bruits. JVD not elevated.  Lungs: Respirations regular and unlabored, without wheezes or rales.  Heart: Regular rate and rhythm. No S3 or S4.  No murmur, no rubs, or gallops appreciated. Abdomen: Soft, non-tender, non-distended with normoactive bowel sounds. No hepatomegaly. No rebound/guarding. No obvious abdominal masses. Msk:  Strength and tone appear normal for age. No joint deformities or effusions. Extremities: No clubbing or cyanosis. Trace ankle edema.  Distal pedal pulses are 2+ bilaterally. Neuro: Alert and oriented X 3. Moves all extremities spontaneously. No focal deficits noted. Psych:  Responds to questions appropriately with a normal affect. Skin: No rashes or lesions noted  Wt Readings from Last 3 Encounters:  10/23/17 236 lb 3.2 oz (107.1 kg)  10/20/17 230 lb (104.3 kg)  10/13/17 233 lb (105.7 kg)     Studies/Labs Reviewed:   EKG:  EKG is not ordered today.   Recent Labs: 04/29/2017: B Natriuretic Peptide 2,712.1 05/20/2017: Magnesium 2.0 07/21/2017: ALT 11; TSH 0.54 08/18/2017: Hemoglobin 13.6; Platelets 156 10/20/2017: BUN 51; Creatinine, Ser 2.29; Potassium 4.6; Sodium 141   Lipid Panel    Component Value Date/Time   CHOL 165 07/21/2017 1516   TRIG 88.0 07/21/2017 1516   HDL 66.10 07/21/2017 1516   CHOLHDL 2 07/21/2017 1516   VLDL 17.6 07/21/2017 1516   LDLCALC 81 07/21/2017 1516   LDLDIRECT 121.6 03/15/2012 1646    Additional studies/ records that were reviewed today include:   Echocardiogram: 06/2017 Study Conclusions  - Left ventricle: The cavity size was normal. There was severe   concentric  hypertrophy. Systolic function was moderately to   severely reduced. The estimated ejection fraction was in the   range of 30% to 35%. Diffuse hypokinesis. - Mitral valve: There was mild to moderate regurgitation. - Left atrium: The atrium was severely dilated. - Right ventricle: The cavity size was mildly dilated. Wall   thickness was normal. Systolic function was moderately reduced. - Right atrium: The atrium was moderately dilated. - Pericardium, extracardiac: A trivial pericardial  effusion was   identified. Features were not consistent with tamponade   physiology.  Impressions:  - When compared to the prior study from 03/25/2017, LVEF is mildly   improved from 15% to 30-30%, RVEF remains moderately decreased.   There is severe concentric LVH, moderate to severe bi-atrial   dilatation and valves thickening. Consider further workup for   cardiac amyloidosis.  Assessment:    1. Chronic systolic congestive heart failure ([place])   2. Nonischemic cardiomyopathy ([facility])   3. Paroxysmal atrial fibrillation ([place])   4. Chronic anticoagulation   5. Medication management   6. Essential hypertension   7. CKD (chronic kidney disease), stage III ([place])      Plan:   In order of problems listed above:  1. Chronic Systolic CHF/ Nonischemic Cardiomyopathy - most recent echocardiogram showed slight improvement in the EF to 30-35%. Cath in 09/2016 showed normal cors.  - she has trace lower extremity edema on examination but lungs are clear and JVD is at baseline. Weight is up since holding Lasix but the patient reports it has been stable on her home scales. - plan to restart Lasix tomorrow at 20mg  as outlined by Dr. [name]. Can take an additional tablet for weight gain of 2-3 lbs overnight or 5 lbs in one week. Plan for repeat BMET in 2 weeks. - continue Coreg, BiDil, Entresto, and Spironolactone (with dose adjustment as outlined below).   2. Paroxysmal Atrial Fibrillation - she denies any  recent palpitations. Maintaining NSR by examination today. Recent CXR showed no evidence of Amiodarone toxicity.  - continue Amiodarone 200mg  daily and Coreg 3.125mg  BID.   3. Use of Long-term Anticoagulation - she denies any evidence of active bleeding. Continue Coumadin for anticoagulation  4. HTN - BP remains soft at 93/61. Similar values on recheck. Denies any symptoms with this.  - will reduce Spironolactone from 25mg  daily to 12.5mg  daily. May require further reduction of medications pending BP response.   5. Stage 3 CKD - BMET on 11/27 showed creatinine was elevated to 2.29. Possibly secondary to hypoperfusion in the setting of hypotension. - will reduce Spironolactone to 12.5mg  daily. Lasix already reduced from 40mg  daily to 20mg  daily. Scheduled for a repeat BMET in 2 weeks.    Medication Adjustments/Labs and Tests Ordered: Current medicines are reviewed at length with the patient today.  Concerns regarding medicines are outlined above.  Medication changes, Labs and Tests ordered today are listed in the Patient Instructions below. Patient Instructions  Bernerd Pho, PA-C has recommended making the following medication changes: 1. DECREASE Spironolactone to 12.5 mg daily  Your physician recommends that you return for lab work on 11/05/17.  Please keep your follow-up appointment in January with [name].   Signed, Erma Heritage, PA-C  10/24/2017 9:38 AM    [place], [address] [place], [place]  [address] Phone: [phone]; Fax: [phone]  [address], [place], [place] [address] Phone: [phone]

## 2017-10-23 ENCOUNTER — Ambulatory Visit: Payer: BLUE CROSS/BLUE SHIELD | Admitting: Student

## 2017-10-23 ENCOUNTER — Encounter: Payer: Self-pay | Admitting: Student

## 2017-10-23 VITALS — BP 93/61 | HR 76 | Ht 65.0 in | Wt 236.2 lb

## 2017-10-23 DIAGNOSIS — I10 Essential (primary) hypertension: Secondary | ICD-10-CM

## 2017-10-23 DIAGNOSIS — N183 Chronic kidney disease, stage 3 unspecified: Secondary | ICD-10-CM

## 2017-10-23 DIAGNOSIS — Z79899 Other long term (current) drug therapy: Secondary | ICD-10-CM

## 2017-10-23 DIAGNOSIS — I480 Paroxysmal atrial fibrillation: Secondary | ICD-10-CM

## 2017-10-23 DIAGNOSIS — I428 Other cardiomyopathies: Secondary | ICD-10-CM

## 2017-10-23 DIAGNOSIS — I5022 Chronic systolic (congestive) heart failure: Secondary | ICD-10-CM

## 2017-10-23 DIAGNOSIS — Z7901 Long term (current) use of anticoagulants: Secondary | ICD-10-CM | POA: Diagnosis not present

## 2017-10-23 MED ORDER — SPIRONOLACTONE 25 MG PO TABS: 12.5000 mg | ORAL_TABLET | Freq: Every day | ORAL | 3 refills | Status: DC

## 2017-10-23 MED ORDER — DICLOFENAC SODIUM 1 % TD GEL: 4.0000 g | Freq: Four times a day (QID) | TRANSDERMAL | 0 refills | Status: DC | PRN

## 2017-10-23 NOTE — Patient Instructions (Signed)
Bernerd Pho, PA-C has recommended making the following medication changes: 1. DECREASE Spironolactone to 12.5 mg daily  Your physician recommends that you return for lab work on 11/05/17.  Please keep your follow-up appointment in January with Dr [name].

## 2017-10-24 ENCOUNTER — Encounter: Payer: Self-pay | Admitting: Student

## 2017-10-27 ENCOUNTER — Other Ambulatory Visit: Payer: Self-pay | Admitting: Student

## 2017-10-27 ENCOUNTER — Encounter (HOSPITAL_COMMUNITY): Payer: BLUE CROSS/BLUE SHIELD | Admitting: Internal Medicine

## 2017-10-27 ENCOUNTER — Encounter: Payer: BLUE CROSS/BLUE SHIELD | Admitting: *Deleted

## 2017-10-27 VITALS — BP 112/67 | HR 57 | Resp 14 | Wt 231.0 lb

## 2017-10-27 DIAGNOSIS — Z006 Encounter for examination for normal comparison and control in clinical research program: Secondary | ICD-10-CM

## 2017-10-27 DIAGNOSIS — I4891 Unspecified atrial fibrillation: Secondary | ICD-10-CM

## 2017-10-27 MED ORDER — FUROSEMIDE 20 MG PO TABS: 20.0000 mg | ORAL_TABLET | Freq: Every day | ORAL | Status: DC

## 2017-10-27 NOTE — Progress Notes (Signed)
RESEARCH ENCOUNTER  Patient ID: Patricia Wagner  DOB: 06-08-1951  [name] presented to the [facility] for the Week 6 visit of the [facility].  No signs/symptoms of ACS since the last visit.  Subject states compliance with IP.  [facility] 2 [facility].

## 2017-11-02 IMAGING — CT CT ANGIO HEAD
1 of 10 series · 1 of 33 positions shown · IV contrast (Iohexol (Omnipaque 350))
Comparison: MRI of the brain from the same day.

CLINICAL DATA: Headache at the level the left eye beginning today.
Dizziness. Stroke. Left superior cerebellar infarct.

EXAM:
CT ANGIOGRAPHY HEAD AND NECK
TECHNIQUE: Multidetector CT imaging of the head and neck was performed using
the standard protocol during bolus administration of intravenous
contrast. Multiplanar CT image reconstructions and MIPs were
obtained to evaluate the vascular anatomy. Carotid stenosis
measurements (when applicable) are obtained utilizing NASCET
criteria, using the distal internal carotid diameter as the
denominator.
CONTRAST:  50 mL Isovue 370

[Series 200: locator · axial · 0.49mm/px · 1 of 1 slices shown]
[im 1/1  soft-tissue]
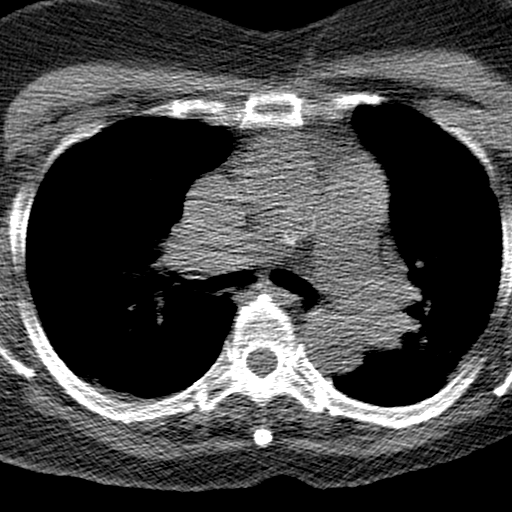

[1 of 33 positions shown; findings below may reference images not displayed]

FINDINGS: CTA NECK FINDINGS

Aortic arch: Study is somewhat degraded by patient motion.
Atherosclerotic calcifications are present at the aortic arch
without significant stenosis.

Right carotid system: The right common carotid artery demonstrates
mild tortuosity without significant stenosis. The bifurcation is
unremarkable. There is mild tortuosity within the cervical right ICA
is well. No focal stenosis present.

Left carotid system: The left common carotid artery is within normal
limits. Calcifications are present at the left carotid bifurcation
without significant stenosis. The cervical left ICA is unremarkable.

Vertebral arteries: The right vertebral artery originates from the
subclavian artery. The origin is obscured by artifact. The left
vertebral artery is not visualized proximally is likely occluded. It
is reconstituted at the C2 level via musculoskeletal branches. There
is no significant focal stenosis of the right vertebral artery in
the neck.

Skeleton: Multilevel endplate changes are present throughout
cervical spine. Uncovertebral spurring contributes to osseous
foraminal narrowing most evident at C4-5 and C5-6, left greater than
right.

Other neck: The soft tissues the neck are unremarkable. No
significant adenopathy is present.

Upper chest: The lung apices demonstrate mild dependent atelectasis.
No focal nodule or mass lesion is present. The superior mediastinum
is otherwise unremarkable. The thoracic inlet is within normal
limits.

Review of the MIP images confirms the above findings

CTA HEAD FINDINGS

Anterior circulation: Atherosclerotic calcifications are present
within the cavernous internal carotid arteries bilaterally without a
significant stenosis through the ICA termini bilaterally. The A1 and
M1 segments are normal. The MCA bifurcations are intact bilaterally.
The anterior communicating artery is patent. Mild distal small
vessel disease is present without a significant proximal stenosis or
occlusion.

Posterior circulation: The right vertebral artery is dominant to
left. Calcifications are present at dural margin of the right
vertebral artery without a significant stenosis. The vertebrobasilar
junction is intact. The PICA origins are visualized and normal. The
left superior cerebellar artery is occluded, consistent with the
area of infarction. The left PICA is engorged. This likely
represents luxury perfusion. Both posterior cerebral arteries
originate from the basilar tip. Both posterior cerebral arteries
originate from the basilar tip. The PCA branch vessels are within
normal limits bilaterally.

Venous sinuses: The dural sinuses are patent. The right transverse
sinus is dominant. The straight sinus and deep cerebral veins are
intact. Cortical veins are unremarkable.

Anatomic variants: None

Delayed phase: The postcontrast images demonstrate no pathologic
enhancement. The left superior cerebellar infarct is again noted.

Review of the MIP images confirms the above findings
IMPRESSION: 1. Left superior cerebellar nonhemorrhagic infarct.
2. Occlusion of the left superior cerebellar artery.
3. Mild engorgement of the left posterior inferior cerebellar artery
likely representing luxury perfusion.
4. Atherosclerotic calcifications at the dural margin of the right
vertebral artery in both cavernous internal carotid arteries without
significant stenosis.
5. Mild atherosclerotic changes of the left carotid bifurcation and
at the aortic arch without significant stenosis.
6. Multilevel spondylosis of the cervical spine is most evident at
C4-5 and C5-6.

## 2017-11-02 IMAGING — MR MR HEAD W/O CM
9 of 10 series · 37 of 48 positions shown · non-contrast
Comparison: CT head without contrast 11/20/2016

CLINICAL DATA: New onset lightheadedness and dizziness. Nausea,
vomiting, and diarrhea.

EXAM:
MRI HEAD WITHOUT CONTRAST
TECHNIQUE: Multiplanar, multiecho pulse sequences of the brain and surrounding
structures were obtained without intravenous contrast.

[Series 3: T1 · sagittal · 5.0mm · 0.47mm/px · 3 of 23 slices shown]
[im 1/23]
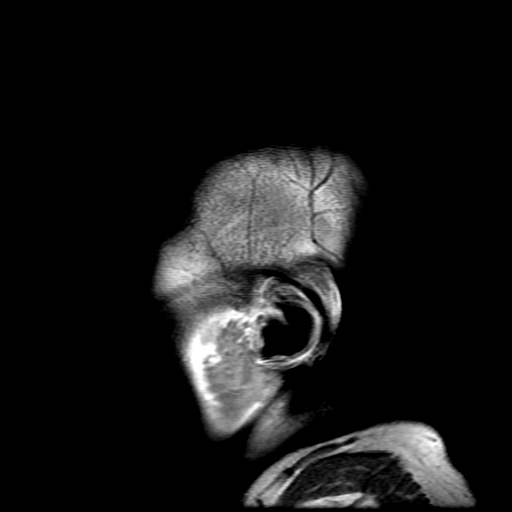
[im 12/23]
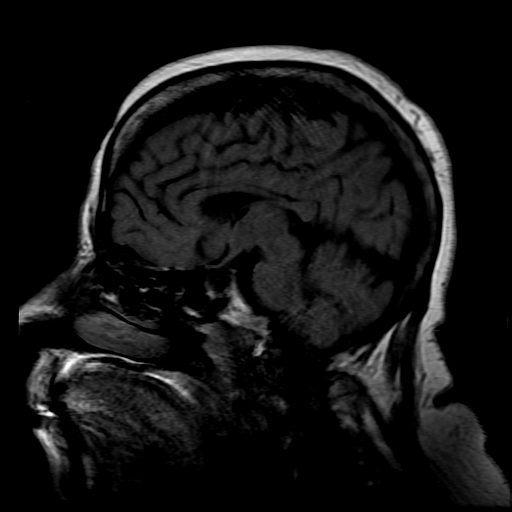
[im 23/23]
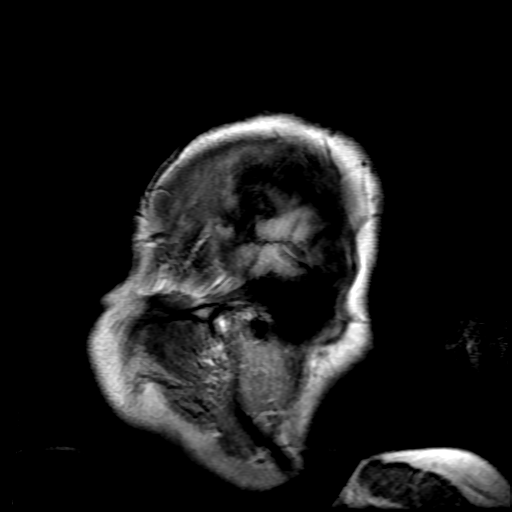

[Series 4: DWI · axial · 3.0mm · 1.09mm/px · z∈[-48,+90]mm · 11 of 94 slices shown (1 of 4)]
[im 1/94]
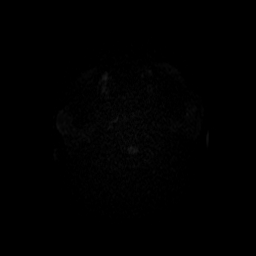
[im 10/94]
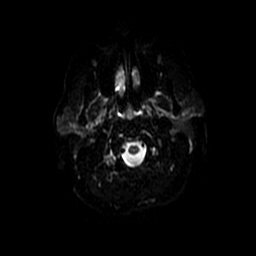
[im 19/94]
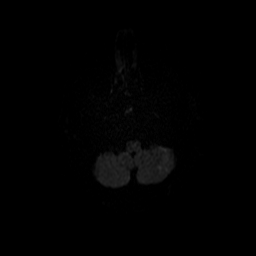
[im 28/94]
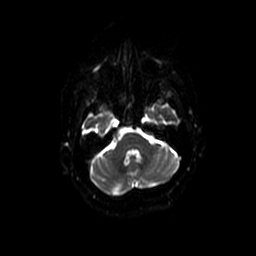
[im 38/94]
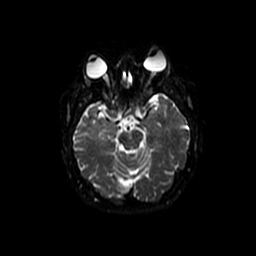
[im 47/94]
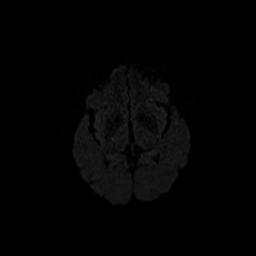
[im 56/94]
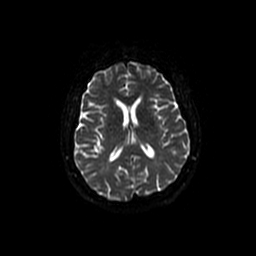
[im 66/94]
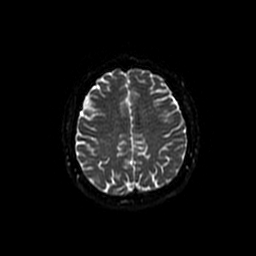
[im 75/94]
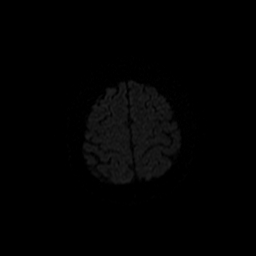
[im 84/94]
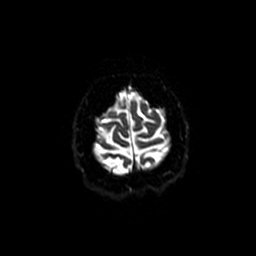
[im 94/94]
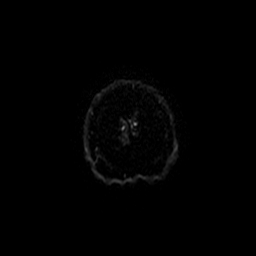

[Series 5: DWI · coronal · 5.0mm · 1.09mm/px · 7 of 66 slices shown (2 of 4)]
[im 1/66]
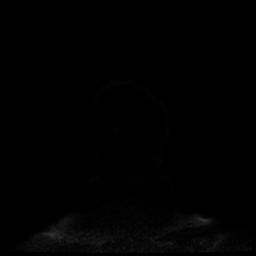
[im 11/66]
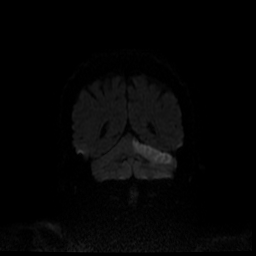
[im 22/66]
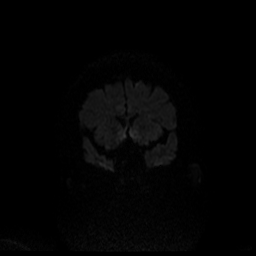
[im 33/66]
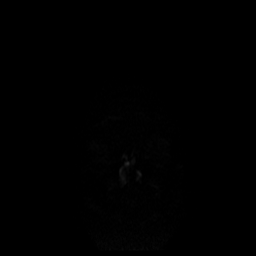
[im 44/66]
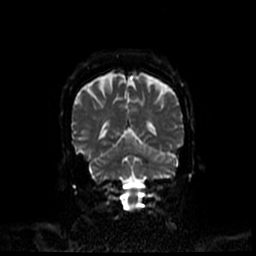
[im 55/66]
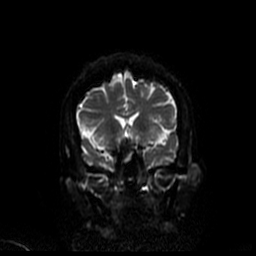
[im 66/66]
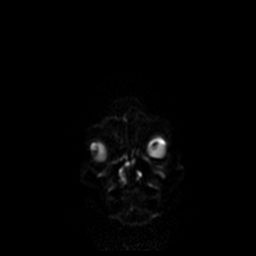

[Series 6: T2 · axial · 5.0mm · 0.43mm/px · z∈[-44,+93]mm · 2 of 24 slices shown (1 of 2)]
[im 1/24]
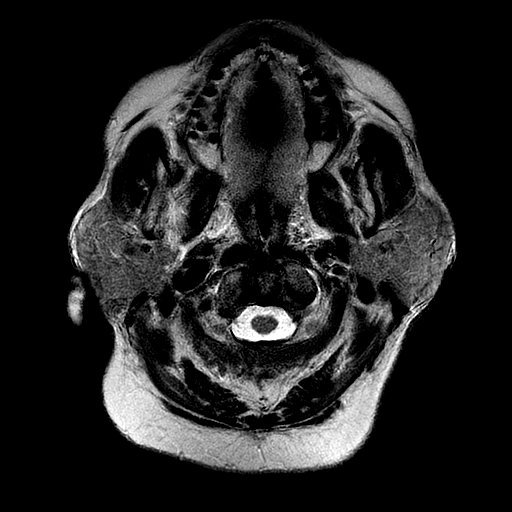
[im 24/24]
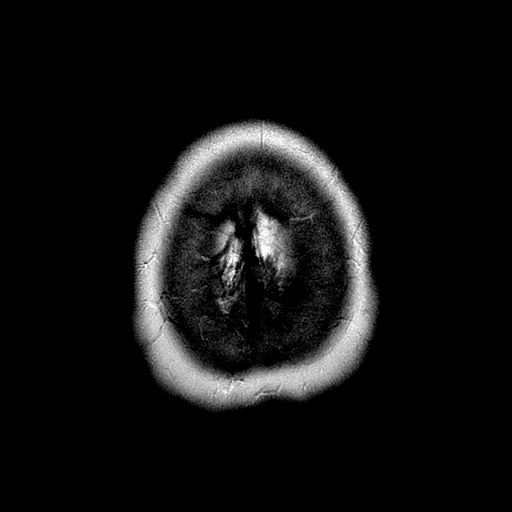

[Series 7: FLAIR · axial · 5.0mm · 0.43mm/px · z∈[-44,+93]mm · 2 of 24 slices shown]
[im 1/24]
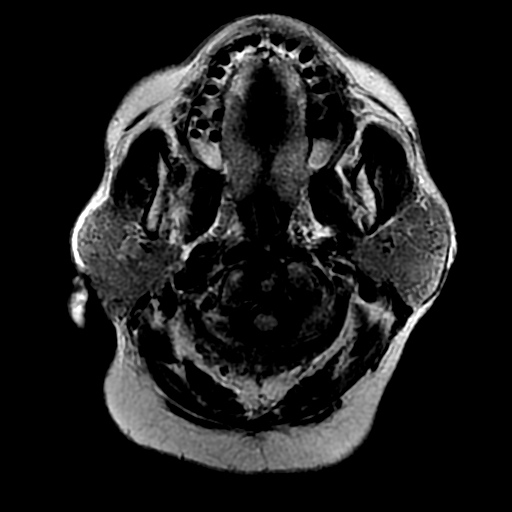
[im 24/24]
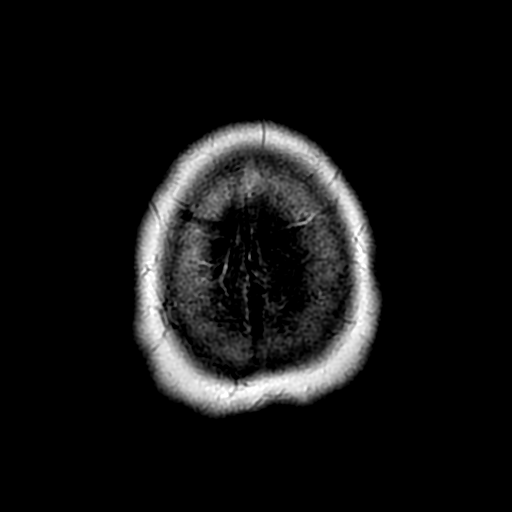

[Series 8: T2 · coronal · 5.0mm · 0.43mm/px · 3 of 26 slices shown (2 of 2)]
[im 1/26]
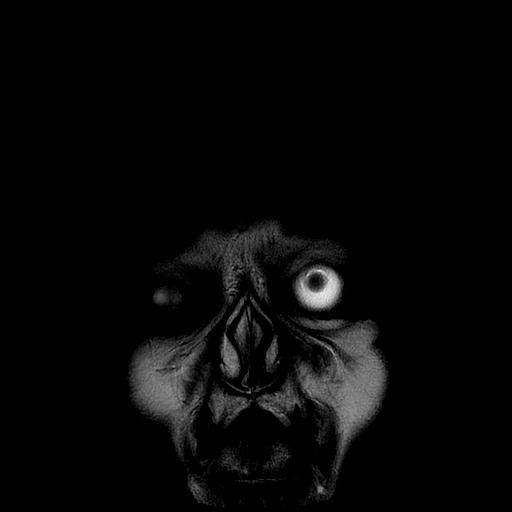
[im 13/26]
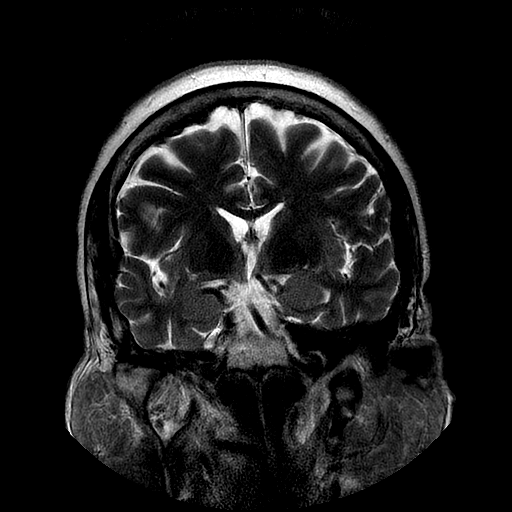
[im 26/26]
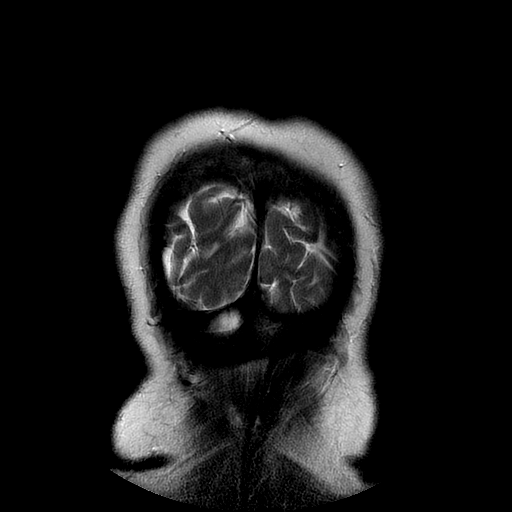

[Series 9: ax mpgr · axial · 5.0mm · 0.43mm/px · 1 of 24 slices shown]
[im 1/24]
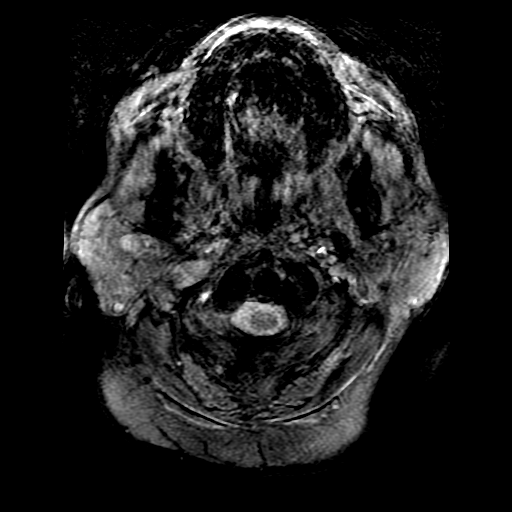

[Series 400: DWI · axial · 3.0mm · 1.09mm/px · z∈[-48,+90]mm · 5 of 47 slices shown (3 of 4)]
[im 1/47]
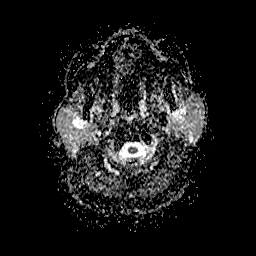
[im 12/47]
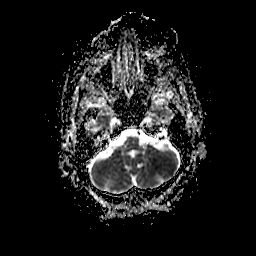
[im 24/47]
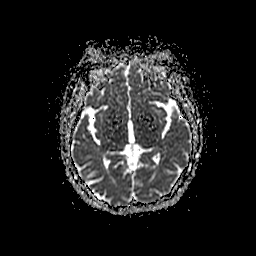
[im 35/47]
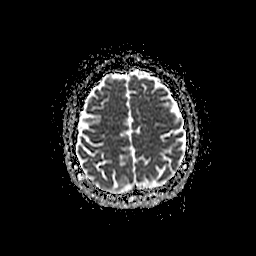
[im 47/47]
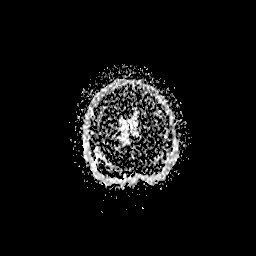

[Series 500: DWI · coronal · 5.0mm · 1.09mm/px · 3 of 33 slices shown (4 of 4)]
[im 1/33]
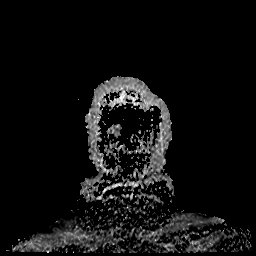
[im 17/33]
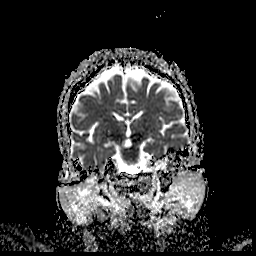
[im 33/33]
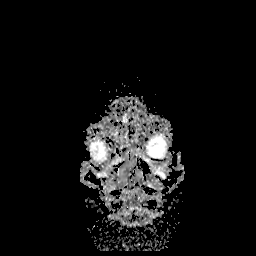

[37 of 48 positions shown; findings below may reference images not displayed]

FINDINGS: Brain: An acute/subacute nonhemorrhagic infarct is evident within
the left superior cerebellum. No acute supratentorial infarct is
present. Subtle T2 changes are associated with the infarct
suggesting a more acute timeframe.

A remote cortical infarct is present in the right occipital lobe
medially. No other focal infarct is present. Mild periventricular T2
changes are within normal limits for age. The ventricles are normal
size. No significant extra-axial fluid collection is present.

Vascular: Flow is present in the major intracranial arteries.

Skull and upper cervical spine: A Tornwaldt cyst is present at the
nasopharynx. The skullbase is otherwise within normal limits. The
sella turcica is enlarged. No mass lesion is present. Midline
sagittal structures are otherwise unremarkable. Degenerative changes
are noted in the upper cervical spine with moderate cervical
stenosis suggested at C4-5.

Sinuses/Orbits: The paranasal sinuses and the mastoid air cells are
clear. The globes and orbits are within normal limits.
IMPRESSION: 1. Acute nonhemorrhagic left superior cerebellar infarct.
2. Normal flow is present within the major arteries of the circle of
[place].
3. Remote right occipital lobe infarct.

## 2017-11-05 ENCOUNTER — Ambulatory Visit (INDEPENDENT_AMBULATORY_CARE_PROVIDER_SITE_OTHER): Payer: BLUE CROSS/BLUE SHIELD | Admitting: Pharmacist Clinician (PhC)/ Clinical Pharmacy Specialist

## 2017-11-05 DIAGNOSIS — I480 Paroxysmal atrial fibrillation: Secondary | ICD-10-CM | POA: Diagnosis not present

## 2017-11-05 DIAGNOSIS — Z7901 Long term (current) use of anticoagulants: Secondary | ICD-10-CM

## 2017-11-05 LAB — POCT INR: INR: 2

## 2017-11-06 ENCOUNTER — Telehealth: Payer: Self-pay | Admitting: Cardiology

## 2017-11-06 LAB — BASIC METABOLIC PANEL
BUN/Creatinine Ratio: 17 (ref 12–28)
BUN: 29 mg/dL — ABNORMAL HIGH (ref 8–27)
CHLORIDE: 104 mmol/L (ref 96–106)
CO2: 23 mmol/L (ref 20–29)
Calcium: 9.5 mg/dL (ref 8.7–10.3)
Creatinine, Ser: 1.66 mg/dL — ABNORMAL HIGH (ref 0.57–1.00)
GFR calc Af Amer: 37 mL/min/{1.73_m2} — ABNORMAL LOW (ref >59)
GFR, EST NON AFRICAN AMERICAN: 32 mL/min/{1.73_m2} — AB (ref >59)
Glucose: 88 mg/dL (ref 65–99)
POTASSIUM: 4.8 mmol/L (ref 3.5–5.2)
SODIUM: 140 mmol/L (ref 134–144)

## 2017-11-06 NOTE — Telephone Encounter (Signed)
New message ° ° °Patient calling for lab results. Please call °

## 2017-11-09 ENCOUNTER — Other Ambulatory Visit: Payer: Self-pay

## 2017-11-09 NOTE — Telephone Encounter (Signed)
Returned call to patient lab results given.She stated she is having to strain to urinate,burns slightly.Advised to call PCP.

## 2017-11-09 NOTE — Telephone Encounter (Signed)
Mrs. Risk Is calling to get her lab results .  Please call .Marland Kitchen Thanks

## 2017-11-10 ENCOUNTER — Other Ambulatory Visit (INDEPENDENT_AMBULATORY_CARE_PROVIDER_SITE_OTHER): Payer: BLUE CROSS/BLUE SHIELD

## 2017-11-10 ENCOUNTER — Encounter: Payer: Self-pay | Admitting: Internal Medicine

## 2017-11-10 ENCOUNTER — Encounter: Payer: BLUE CROSS/BLUE SHIELD | Admitting: *Deleted

## 2017-11-10 ENCOUNTER — Ambulatory Visit: Payer: BLUE CROSS/BLUE SHIELD | Admitting: Internal Medicine

## 2017-11-10 VITALS — BP 84/60 | HR 68 | Resp 16 | Wt 238.0 lb

## 2017-11-10 VITALS — BP 102/62 | Temp 97.6°F | Ht 65.0 in | Wt 236.0 lb

## 2017-11-10 DIAGNOSIS — I10 Essential (primary) hypertension: Secondary | ICD-10-CM

## 2017-11-10 DIAGNOSIS — Z006 Encounter for examination for normal comparison and control in clinical research program: Secondary | ICD-10-CM

## 2017-11-10 DIAGNOSIS — I5022 Chronic systolic (congestive) heart failure: Secondary | ICD-10-CM

## 2017-11-10 DIAGNOSIS — R300 Dysuria: Secondary | ICD-10-CM | POA: Diagnosis not present

## 2017-11-10 LAB — URINALYSIS, ROUTINE W REFLEX MICROSCOPIC
BILIRUBIN URINE: NEGATIVE
Hgb urine dipstick: NEGATIVE
KETONES UR: NEGATIVE
LEUKOCYTES UA: NEGATIVE
NITRITE: POSITIVE — AB
PH: 6.5 (ref 5.0–8.0)
RBC / HPF: NONE SEEN (ref 0–?)
SPECIFIC GRAVITY, URINE: 1.015 (ref 1.000–1.030)
TOTAL PROTEIN, URINE-UPE24: NEGATIVE
URINE GLUCOSE: NEGATIVE
UROBILINOGEN UA: 0.2 (ref 0.0–1.0)

## 2017-11-10 MED ORDER — CEPHALEXIN 500 MG PO CAPS: 500.0000 mg | ORAL_CAPSULE | Freq: Three times a day (TID) | ORAL | 0 refills | Status: AC

## 2017-11-10 NOTE — Progress Notes (Signed)
RESEARCH ENCOUNTER  Patient ID: Patricia Wagner  DOB: Apr 23, 1951  Patricia Wagner presented to the [facility] for the Week 8 visit of the [facility].  No signs/symptoms of ACS since the last visit. Subject compliant with IP, IP returned, and additional IP dispensed.    [facility] 4 [facility].

## 2017-11-10 NOTE — Patient Instructions (Signed)
Please take all new medication as prescribed  - the antibiotic  Please continue all other medications as before, and refills have been done if requested.  Please have the pharmacy call with any other refills you may need.  Please continue your efforts at being more active, low cholesterol diet, and weight control.  Please keep your appointments with your specialists as you may have planned  Please go to the LAB in the Basement (turn left off the elevator) for the tests to be done today - just the urine testing today  You will be contacted by phone if any changes need to be made immediately.  Otherwise, you will receive a letter about your results with an explanation, but please check with MyChart first.  Please remember to sign up for MyChart if you have not done so, as this will be important to you in the future with finding out test results, communicating by private email, and scheduling acute appointments online when needed.

## 2017-11-11 ENCOUNTER — Encounter: Payer: Self-pay | Admitting: Internal Medicine

## 2017-11-11 NOTE — Assessment & Plan Note (Signed)
Exam c/w uti, for urine studies and empiric antbx, f/u study results,  to f/u any worsening symptoms or concerns

## 2017-11-11 NOTE — Progress Notes (Signed)
Subjective:    Patient ID: Patricia Wagner, female    DOB: [birth year], 66 y.o.   MRN: [id number]  HPI  Here with 2-3 days dysuria and freq, but Denies urinary symptoms such as urgency, flank pain, hematuria or n/v, fever, chills.  Pt denies chest pain, increased sob or doe, wheezing, orthopnea, PND, increased LE swelling, palpitations, dizziness or syncope.  Pt denies new neurological symptoms such as new headache, or facial or extremity weakness or numbness   Pt denies polydipsia, polyuria. Past Medical History:  Diagnosis Date  . Arthritis   . Atrial fibrillation ([place])    a. s/p multiple cardioversions; failed tikosyn/sotalol.  Marland Kitchen CAD in native artery, s/p cardiac cath with non obstructive CAD 10/24/2016  . CHF (congestive heart failure) ([place])   . DIVERTICULITIS, HX OF 07/25/2007  . DIVERTICULOSIS, COLON 07/22/2007  . Dyspnea   . Edema, peripheral    a. chronic BLE edema, R>L. Prior trauma from dog attack and accident.  Marland Kitchen HYPERLIPIDEMIA 02/03/2008  . Hypersomnia    declines w/u  . Hypertension   . MENOPAUSAL DISORDER 01/09/2011  . Morbid obesity ([place]) 07/22/2007  . NICM (nonischemic cardiomyopathy) ([place]) 10/24/2016  . Raynaud's syndrome 07/22/2007  . Stroke ([place]) 2017  . THYROID NODULE, RIGHT 01/04/2010  . VITAMIN D DEFICIENCY 01/09/2011   Qualifier: Diagnosis of  By: [name], [name]    Past Surgical History:  Procedure Laterality Date  . CARDIAC CATHETERIZATION N/A 10/23/2016   Procedure: Left Heart Cath and Coronary Angiography;  Surgeon: [name], MD;  Location: [place] [facility];  Service: Cardiovascular;  Laterality: N/A;  . CARDIOVERSION N/A 10/31/2016   Procedure: CARDIOVERSION;  Surgeon: [name], MD;  Location: [place];  Service: Cardiovascular;  Laterality: N/A;  . CARDIOVERSION N/A 11/03/2016   Procedure: CARDIOVERSION;  Surgeon: [name], MD;  Location: [place];  Service: Cardiovascular;  Laterality: N/A;  . CARDIOVERSION N/A 11/18/2016   Procedure: CARDIOVERSION;  Surgeon: [name], MD;  Location: [facility] ENDOSCOPY;  Service: Cardiovascular;  Laterality: N/A;  . CARDIOVERSION N/A 03/25/2017   Procedure: CARDIOVERSION;  Surgeon: [name], MD;  Location: [facility] ENDOSCOPY;  Service: Cardiovascular;  Laterality: N/A;  . CARDIOVERSION N/A 05/06/2017   Procedure: CARDIOVERSION;  Surgeon: [name], MD;  Location: [facility] ENDOSCOPY;  Service: Cardiovascular;  Laterality: N/A;  . CARDIOVERSION N/A 05/12/2017   Procedure: CARDIOVERSION;  Surgeon: [name], MD;  Location: [facility] ENDOSCOPY;  Service: Cardiovascular;  Laterality: N/A;  . CHOLECYSTECTOMY    . COLONOSCOPY    . PARTIAL HYSTERECTOMY     1 OVARY LEFT  . POLYPECTOMY    . TEE WITHOUT CARDIOVERSION N/A 10/31/2016   Procedure: TRANSESOPHAGEAL ECHOCARDIOGRAM (TEE);  Surgeon: [name], MD;  Location: [facility] ENDOSCOPY;  Service: Cardiovascular;  Laterality: N/A;  . TEE WITHOUT CARDIOVERSION N/A 03/25/2017   Procedure: TRANSESOPHAGEAL ECHOCARDIOGRAM (TEE);  Surgeon: [name], MD;  Location: [facility] ENDOSCOPY;  Service: Cardiovascular;  Laterality: N/A;    reports that she has been smoking cigarettes.  She has been smoking about 0.50 packs per day. she has never used smokeless tobacco. She reports that she drinks about 2.4 oz of alcohol per week. She reports that she does not use drugs. family history includes Asthma in her mother; Diabetes in her father; Heart disease in her father and sister; Lung disease in her sister. Allergies  Allergen Reactions  . Ace Inhibitors Palpitations   Current Outpatient Medications on File Prior to Visit  Medication Sig Dispense Refill  . acetaminophen (TYLENOL) 325 MG tablet Take 650 mg by mouth every 6 (six) hours as needed (pain).     Marland Kitchen amiodarone (PACERONE) 200 MG tablet Take 1 tablet (200 mg total) by mouth daily. 30 tablet 6  . diclofenac sodium (VOLTAREN) 1 % GEL Apply 4 g topically 4 (four) times daily as needed (As directed  for skin). 100 g 0  . furosemide (LASIX) 20 MG tablet Take 1 tablet (20 mg total) by mouth daily.    . Investigational - Study Medication Take 1 tablet by mouth 2 (two) times daily. Study name: Galactic HF Study  Additional study details: Omecamtiv Mecarbil or Placebo 1 each PRN  . isosorbide-hydrALAZINE (BIDIL) 20-37.5 MG tablet Take 1 tablet by mouth 3 (three) times daily. 180 tablet 11  . ranolazine (RANEXA) 500 MG 12 hr tablet Take 1 tablet (500 mg total) by mouth 2 (two) times daily. 60 tablet 5  . rosuvastatin (CRESTOR) 10 MG tablet TAKE 1 TABLET (10 MG TOTAL) BY MOUTH DAILY. 90 tablet 2  . sacubitril-valsartan (ENTRESTO) 49-51 MG Take 1 tablet by mouth 2 (two) times daily. 60 tablet 3  . spironolactone (ALDACTONE) 25 MG tablet Take 0.5 tablets (12.5 mg total) by mouth daily. 45 tablet 3  . warfarin (COUMADIN) 3 MG tablet TAKE 1 TABLET BY MOUTH DAILY OR AS DIRECTED BY COUMADIN CLINIC 30 tablet 3  . carvedilol (COREG) 3.125 MG tablet Take 1 tablet (3.125 mg total) by mouth 2 (two) times daily. 180 tablet 3   No current facility-administered medications on file prior to visit.    Review of Systems  Constitutional: Negative for other unusual diaphoresis or sweats HENT: Negative for ear discharge or swelling Eyes: Negative for other worsening visual disturbances Respiratory: Negative for stridor or other swelling  Gastrointestinal: Negative for worsening distension or other blood Genitourinary: Negative for retention or other urinary change Musculoskeletal: Negative for other MSK pain or swelling Skin: Negative for color change or other new lesions Neurological: Negative for worsening tremors and other numbness  Psychiatric/Behavioral: Negative for worsening agitation or other fatigue All other system neg per pt    Objective:   Physical Exam BP 102/62   Temp 97.6 F (36.4 C) (Oral)   Ht 5\' 5"  (1.651 m)   Wt 236 lb (107 kg)   BMI 39.27 kg/m  VS noted,  Constitutional: Pt appears  in NAD HENT: Head: NCAT.  Right Ear: External ear normal.  Left Ear: External ear normal.  Eyes: . Pupils are equal, round, and reactive to light. Conjunctivae and EOM are normal Nose: without d/c or deformity Neck: Neck supple. Gross normal ROM Cardiovascular: Normal rate and regular rhythm.   Pulmonary/Chest: Effort normal and breath sounds without rales or wheezing.  Abd:  Soft, ND, + BS, no organomegaly with low mid abd tender without guarding or rebound, and no flank tender Neurological: Pt is alert. At baseline orientation, motor grossly intact Skin: Skin is warm. No rashes, other new lesions, no LE edema Psychiatric: Pt behavior is normal without agitation  No other exam findings    Assessment & Plan:

## 2017-11-11 NOTE — Assessment & Plan Note (Signed)
stable overall by history and exam, recent data reviewed with pt, and pt to continue medical treatment as before,  to f/u any worsening symptoms or concerns  

## 2017-11-11 NOTE — Assessment & Plan Note (Signed)
stable overall by history and exam, and pt to continue medical treatment as before,  to f/u any worsening symptoms or concerns 

## 2017-11-12 ENCOUNTER — Encounter: Payer: Self-pay | Admitting: Internal Medicine

## 2017-11-12 LAB — URINE CULTURE
MICRO NUMBER: 81421545
SPECIMEN QUALITY: ADEQUATE

## 2017-11-23 ENCOUNTER — Telehealth: Payer: Self-pay | Admitting: Internal Medicine

## 2017-11-23 ENCOUNTER — Encounter: Payer: Self-pay | Admitting: Internal Medicine

## 2017-11-23 ENCOUNTER — Ambulatory Visit: Payer: BLUE CROSS/BLUE SHIELD | Admitting: Internal Medicine

## 2017-11-23 VITALS — BP 110/58 | Temp 97.5°F | Ht 65.0 in | Wt 243.1 lb

## 2017-11-23 DIAGNOSIS — I10 Essential (primary) hypertension: Secondary | ICD-10-CM | POA: Diagnosis not present

## 2017-11-23 DIAGNOSIS — R339 Retention of urine, unspecified: Secondary | ICD-10-CM | POA: Diagnosis not present

## 2017-11-23 DIAGNOSIS — R1030 Lower abdominal pain, unspecified: Secondary | ICD-10-CM | POA: Diagnosis not present

## 2017-11-23 MED ORDER — KETOROLAC TROMETHAMINE 30 MG/ML IJ SOLN
30.0000 mg | Freq: Once | INTRAMUSCULAR | Status: AC
  Administered 2017-11-23: 30 mg via INTRAMUSCULAR

## 2017-11-23 MED ORDER — TAMSULOSIN HCL 0.4 MG PO CAPS: 0.4000 mg | ORAL_CAPSULE | Freq: Every day | ORAL | 1 refills | Status: DC

## 2017-11-23 MED ORDER — CIPROFLOXACIN HCL 500 MG PO TABS: 500.0000 mg | ORAL_TABLET | Freq: Two times a day (BID) | ORAL | 0 refills | Status: DC

## 2017-11-23 NOTE — Progress Notes (Signed)
Subjective:    Patient ID: Patricia Wagner, female    DOB: 1951/11/05, 66 y.o.   MRN: [id number]  HPI  Here to f/u with urinary retention worse in the last few days, but Denies urinary symptoms such as dysuria, frequency, urgency, flank pain, hematuria or n/v, fever, chills.  Was tx for UTI last visit dec 18 with ecoli culture proven and pansensitive; states good compliance with cephalexin, but now worse as above in the last few days.  Pt denies chest pain, increased sob or doe, wheezing, orthopnea, PND, increased LE swelling, palpitations, dizziness or syncope.  No prior hx of same and no hx of malignancy or renal stones.  Is adamant although she realizes she might have done so, she will not present to ER. Past Medical History:  Diagnosis Date  . Arthritis   . Atrial fibrillation ([place])    a. s/p multiple cardioversions; failed tikosyn/sotalol.  Marland Kitchen CAD in native artery, s/p cardiac cath with non obstructive CAD 10/24/2016  . CHF (congestive heart failure) ([place])   . DIVERTICULITIS, HX OF 07/25/2007  . DIVERTICULOSIS, COLON 07/22/2007  . Dyspnea   . Edema, peripheral    a. chronic BLE edema, R>L. Prior trauma from dog attack and accident.  Marland Kitchen HYPERLIPIDEMIA 02/03/2008  . Hypersomnia    declines w/u  . Hypertension   . MENOPAUSAL DISORDER 01/09/2011  . Morbid obesity ([place]) 07/22/2007  . NICM (nonischemic cardiomyopathy) ([place]) 10/24/2016  . Raynaud's syndrome 07/22/2007  . Stroke ([place]) 2017  . THYROID NODULE, RIGHT 01/04/2010  . VITAMIN D DEFICIENCY 01/09/2011   Qualifier: Diagnosis of  By: [name], [name]    Past Surgical History:  Procedure Laterality Date  . CARDIAC CATHETERIZATION N/A 10/23/2016   Procedure: Left Heart Cath and Coronary Angiography;  Surgeon: [name], MD;  Location: [place] [facility];  Service: Cardiovascular;  Laterality: N/A;  . CARDIOVERSION N/A 10/31/2016   Procedure: CARDIOVERSION;  Surgeon: [name], MD;  Location: [place];  Service: Cardiovascular;   Laterality: N/A;  . CARDIOVERSION N/A 11/03/2016   Procedure: CARDIOVERSION;  Surgeon: [name], MD;  Location: [place];  Service: Cardiovascular;  Laterality: N/A;  . CARDIOVERSION N/A 11/18/2016   Procedure: CARDIOVERSION;  Surgeon: [name], MD;  Location: [facility] ENDOSCOPY;  Service: Cardiovascular;  Laterality: N/A;  . CARDIOVERSION N/A 03/25/2017   Procedure: CARDIOVERSION;  Surgeon: [name], MD;  Location: [facility] ENDOSCOPY;  Service: Cardiovascular;  Laterality: N/A;  . CARDIOVERSION N/A 05/06/2017   Procedure: CARDIOVERSION;  Surgeon: [name], MD;  Location: [facility] ENDOSCOPY;  Service: Cardiovascular;  Laterality: N/A;  . CARDIOVERSION N/A 05/12/2017   Procedure: CARDIOVERSION;  Surgeon: [name], MD;  Location: [facility] ENDOSCOPY;  Service: Cardiovascular;  Laterality: N/A;  . CHOLECYSTECTOMY    . COLONOSCOPY    . PARTIAL HYSTERECTOMY     1 OVARY LEFT  . POLYPECTOMY    . TEE WITHOUT CARDIOVERSION N/A 10/31/2016   Procedure: TRANSESOPHAGEAL ECHOCARDIOGRAM (TEE);  Surgeon: [name], MD;  Location: [facility] ENDOSCOPY;  Service: Cardiovascular;  Laterality: N/A;  . TEE WITHOUT CARDIOVERSION N/A 03/25/2017   Procedure: TRANSESOPHAGEAL ECHOCARDIOGRAM (TEE);  Surgeon: [name], MD;  Location: [facility] ENDOSCOPY;  Service: Cardiovascular;  Laterality: N/A;    reports that she has been smoking cigarettes.  She has been smoking about 0.50 packs per day. she has never used smokeless tobacco. She reports that she drinks about 2.4 oz of alcohol per week. She reports that she does  not use drugs. family history includes Asthma in her mother; Diabetes in her father; Heart disease in her father and sister; Lung disease in her sister. Allergies  Allergen Reactions  . Ace Inhibitors Palpitations   Current Outpatient Medications on File Prior to Visit  Medication Sig Dispense Refill  . acetaminophen (TYLENOL) 325 MG tablet Take 650 mg by mouth every 6 (six) hours as  needed (pain).     Marland Kitchen amiodarone (PACERONE) 200 MG tablet Take 1 tablet (200 mg total) by mouth daily. 30 tablet 6  . diclofenac sodium (VOLTAREN) 1 % GEL Apply 4 g topically 4 (four) times daily as needed (As directed for skin). 100 g 0  . furosemide (LASIX) 20 MG tablet Take 1 tablet (20 mg total) by mouth daily.    . Investigational - Study Medication Take 1 tablet by mouth 2 (two) times daily. Study name: Galactic HF Study  Additional study details: Omecamtiv Mecarbil or Placebo 1 each PRN  . isosorbide-hydrALAZINE (BIDIL) 20-37.5 MG tablet Take 1 tablet by mouth 3 (three) times daily. 180 tablet 11  . ranolazine (RANEXA) 500 MG 12 hr tablet Take 1 tablet (500 mg total) by mouth 2 (two) times daily. 60 tablet 5  . rosuvastatin (CRESTOR) 10 MG tablet TAKE 1 TABLET (10 MG TOTAL) BY MOUTH DAILY. 90 tablet 2  . sacubitril-valsartan (ENTRESTO) 49-51 MG Take 1 tablet by mouth 2 (two) times daily. 60 tablet 3  . spironolactone (ALDACTONE) 25 MG tablet Take 0.5 tablets (12.5 mg total) by mouth daily. 45 tablet 3  . warfarin (COUMADIN) 3 MG tablet TAKE 1 TABLET BY MOUTH DAILY OR AS DIRECTED BY COUMADIN CLINIC 30 tablet 3  . carvedilol (COREG) 3.125 MG tablet Take 1 tablet (3.125 mg total) by mouth 2 (two) times daily. 180 tablet 3   No current facility-administered medications on file prior to visit.    Review of Systems  Constitutional: Negative for other unusual diaphoresis or sweats HENT: Negative for ear discharge or swelling Eyes: Negative for other worsening visual disturbances Respiratory: Negative for stridor or other swelling  Gastrointestinal: Negative for worsening distension or other blood Genitourinary: Negative for retention or other urinary change Musculoskeletal: Negative for other MSK pain or swelling Skin: Negative for color change or other new lesions Neurological: Negative for worsening tremors and other numbness  Psychiatric/Behavioral: Negative for worsening agitation or  other fatigue All other system neg per pt    Objective:   Physical Exam BP (!) 110/58 (BP Location: Left Arm, Patient Position: Sitting, Cuff Size: Large)   Temp (!) 97.5 F (36.4 C) (Oral)   Ht 5\' 5"  (1.651 m)   Wt 243 lb 1.9 oz (110.3 kg)   BMI 40.46 kg/m  VS noted, in pain lying on exam table as this helps Constitutional: Pt appears in NAD HENT: Head: NCAT.  Right Ear: External ear normal.  Left Ear: External ear normal.  Eyes: . Pupils are equal, round, and reactive to light. Conjunctivae and EOM are normal Nose: without d/c or deformity Neck: Neck supple. Gross normal ROM Cardiovascular: Normal rate and regular rhythm.   Pulmonary/Chest: Effort normal and breath sounds without rales or wheezing.  Abd:  Soft, ND, + BS, no organomegaly but has palpable bladder almost to the umbilicus with tenderness low mid abd without guarding or rebound Neurological: Pt is alert. At baseline orientation, motor grossly intact Skin: Skin is warm. No rashes, other new lesions, no LE edema Psychiatric: Pt behavior is normal without agitation  No other  exam finding    Assessment & Plan:

## 2017-11-23 NOTE — Assessment & Plan Note (Signed)
Differential would include other infectious process such as diverticulitis, but doubt, and pt declines labs or CT today,  to f/u any worsening symptoms or concerns

## 2017-11-23 NOTE — Assessment & Plan Note (Signed)
Etiology unclear, cant r/o recurrent uti though would be very quick in timing and has no fever; I have asked for repeat urine testing but she thinks she may not be able to accomplish this;  We are unable for bladder scan or I&O cath here but pt has bladder distension and discomfort but still refuses to go to ED; will give toradol 30 mg IM now, flomax asd, and cipro course but should clearly go to ED if not quickly improved later today

## 2017-11-23 NOTE — Patient Instructions (Addendum)
You had the pain shot today (toradol)  Please take all new medication as prescribed - the antibiotic, but also the flomax to relax the bladder  Please go to the lab to try to get another urine specimen  Please go to the ER later today if this is not effective, as you will need Catheterizion of the bladder if the medications do not work quickly  Please continue all other medications as before, and refills have been done if requested.  Please have the pharmacy call with any other refills you may need.  Please keep your appointments with your specialists as you may have planned

## 2017-11-23 NOTE — Telephone Encounter (Signed)
Patricia Wagner, from [facility] calling stating that a drug interaction was noted for the pt being prescribed Cipro and currently taking Amiodarone. On call physician, Dr. Juleen China contacted and conference call initiated wit pharmacy so that new antibiotic prescription could be given.

## 2017-11-23 NOTE — Assessment & Plan Note (Signed)
stable overall by history and exam, recent data reviewed with pt, and pt to continue medical treatment as before,  to f/u any worsening symptoms or concerns BP Readings from Last 3 Encounters:  11/23/17 (!) 110/58  11/10/17 (!) 84/60  11/10/17 102/62

## 2017-11-30 ENCOUNTER — Emergency Department (HOSPITAL_COMMUNITY): Payer: BLUE CROSS/BLUE SHIELD

## 2017-11-30 ENCOUNTER — Other Ambulatory Visit: Payer: Self-pay

## 2017-11-30 ENCOUNTER — Inpatient Hospital Stay (HOSPITAL_COMMUNITY)
Admission: EM | Admit: 2017-11-30 | Discharge: 2017-12-08 | DRG: 682 | Disposition: A | Discharge status: Home Health | Payer: BLUE CROSS/BLUE SHIELD | Attending: Family Medicine | Admitting: Family Medicine

## 2017-11-30 ENCOUNTER — Encounter (HOSPITAL_COMMUNITY): Payer: Self-pay | Admitting: Emergency Medicine

## 2017-11-30 ENCOUNTER — Encounter (HOSPITAL_COMMUNITY): Payer: Self-pay | Admitting: Internal Medicine

## 2017-11-30 ENCOUNTER — Ambulatory Visit (HOSPITAL_BASED_OUTPATIENT_CLINIC_OR_DEPARTMENT_OTHER)
Admission: RE | Admit: 2017-11-30 | Discharge: 2017-11-30 | Disposition: A | Discharge status: Home / Self Care | Payer: BLUE CROSS/BLUE SHIELD | Source: Ambulatory Visit | Attending: Internal Medicine | Admitting: Internal Medicine

## 2017-11-30 VITALS — BP 91/57 | HR 67 | Wt 247.5 lb

## 2017-11-30 DIAGNOSIS — I130 Hypertensive heart and chronic kidney disease with heart failure and stage 1 through stage 4 chronic kidney disease, or unspecified chronic kidney disease: Secondary | ICD-10-CM | POA: Diagnosis present

## 2017-11-30 DIAGNOSIS — K5730 Diverticulosis of large intestine without perforation or abscess without bleeding: Secondary | ICD-10-CM | POA: Diagnosis not present

## 2017-11-30 DIAGNOSIS — Z90710 Acquired absence of both cervix and uterus: Secondary | ICD-10-CM

## 2017-11-30 DIAGNOSIS — D696 Thrombocytopenia, unspecified: Secondary | ICD-10-CM | POA: Diagnosis present

## 2017-11-30 DIAGNOSIS — Z90721 Acquired absence of ovaries, unilateral: Secondary | ICD-10-CM

## 2017-11-30 DIAGNOSIS — K5731 Diverticulosis of large intestine without perforation or abscess with bleeding: Secondary | ICD-10-CM | POA: Diagnosis not present

## 2017-11-30 DIAGNOSIS — I481 Persistent atrial fibrillation: Secondary | ICD-10-CM | POA: Diagnosis present

## 2017-11-30 DIAGNOSIS — I10 Essential (primary) hypertension: Secondary | ICD-10-CM | POA: Diagnosis present

## 2017-11-30 DIAGNOSIS — N184 Chronic kidney disease, stage 4 (severe): Secondary | ICD-10-CM | POA: Diagnosis present

## 2017-11-30 DIAGNOSIS — R791 Abnormal coagulation profile: Secondary | ICD-10-CM | POA: Diagnosis present

## 2017-11-30 DIAGNOSIS — Z7983 Long term (current) use of bisphosphonates: Secondary | ICD-10-CM

## 2017-11-30 DIAGNOSIS — Z8249 Family history of ischemic heart disease and other diseases of the circulatory system: Secondary | ICD-10-CM

## 2017-11-30 DIAGNOSIS — Z8673 Personal history of transient ischemic attack (TIA), and cerebral infarction without residual deficits: Secondary | ICD-10-CM | POA: Insufficient documentation

## 2017-11-30 DIAGNOSIS — K625 Hemorrhage of anus and rectum: Secondary | ICD-10-CM | POA: Diagnosis not present

## 2017-11-30 DIAGNOSIS — D62 Acute posthemorrhagic anemia: Secondary | ICD-10-CM | POA: Diagnosis not present

## 2017-11-30 DIAGNOSIS — I429 Cardiomyopathy, unspecified: Secondary | ICD-10-CM | POA: Diagnosis present

## 2017-11-30 DIAGNOSIS — I480 Paroxysmal atrial fibrillation: Secondary | ICD-10-CM

## 2017-11-30 DIAGNOSIS — Z9049 Acquired absence of other specified parts of digestive tract: Secondary | ICD-10-CM

## 2017-11-30 DIAGNOSIS — N320 Bladder-neck obstruction: Secondary | ICD-10-CM | POA: Diagnosis present

## 2017-11-30 DIAGNOSIS — N179 Acute kidney failure, unspecified: Secondary | ICD-10-CM | POA: Diagnosis not present

## 2017-11-30 DIAGNOSIS — I428 Other cardiomyopathies: Secondary | ICD-10-CM

## 2017-11-30 DIAGNOSIS — D638 Anemia in other chronic diseases classified elsewhere: Secondary | ICD-10-CM | POA: Diagnosis present

## 2017-11-30 DIAGNOSIS — Z7901 Long term (current) use of anticoagulants: Secondary | ICD-10-CM

## 2017-11-30 DIAGNOSIS — I63442 Cerebral infarction due to embolism of left cerebellar artery: Secondary | ICD-10-CM | POA: Diagnosis present

## 2017-11-30 DIAGNOSIS — F17210 Nicotine dependence, cigarettes, uncomplicated: Secondary | ICD-10-CM | POA: Insufficient documentation

## 2017-11-30 DIAGNOSIS — Z888 Allergy status to other drugs, medicaments and biological substances status: Secondary | ICD-10-CM

## 2017-11-30 DIAGNOSIS — I7300 Raynaud's syndrome without gangrene: Secondary | ICD-10-CM | POA: Diagnosis present

## 2017-11-30 DIAGNOSIS — I4891 Unspecified atrial fibrillation: Secondary | ICD-10-CM | POA: Diagnosis present

## 2017-11-30 DIAGNOSIS — N183 Chronic kidney disease, stage 3 (moderate): Secondary | ICD-10-CM

## 2017-11-30 DIAGNOSIS — R338 Other retention of urine: Secondary | ICD-10-CM

## 2017-11-30 DIAGNOSIS — R197 Diarrhea, unspecified: Secondary | ICD-10-CM | POA: Diagnosis present

## 2017-11-30 DIAGNOSIS — N170 Acute kidney failure with tubular necrosis: Secondary | ICD-10-CM

## 2017-11-30 DIAGNOSIS — E041 Nontoxic single thyroid nodule: Secondary | ICD-10-CM | POA: Diagnosis present

## 2017-11-30 DIAGNOSIS — I482 Chronic atrial fibrillation: Secondary | ICD-10-CM | POA: Diagnosis not present

## 2017-11-30 DIAGNOSIS — E870 Hyperosmolality and hypernatremia: Secondary | ICD-10-CM | POA: Diagnosis not present

## 2017-11-30 DIAGNOSIS — K922 Gastrointestinal hemorrhage, unspecified: Secondary | ICD-10-CM | POA: Diagnosis not present

## 2017-11-30 DIAGNOSIS — R110 Nausea: Secondary | ICD-10-CM | POA: Diagnosis not present

## 2017-11-30 DIAGNOSIS — I509 Heart failure, unspecified: Secondary | ICD-10-CM

## 2017-11-30 DIAGNOSIS — K5733 Diverticulitis of large intestine without perforation or abscess with bleeding: Secondary | ICD-10-CM | POA: Diagnosis not present

## 2017-11-30 DIAGNOSIS — E278 Other specified disorders of adrenal gland: Secondary | ICD-10-CM | POA: Diagnosis present

## 2017-11-30 DIAGNOSIS — E279 Disorder of adrenal gland, unspecified: Secondary | ICD-10-CM | POA: Diagnosis not present

## 2017-11-30 DIAGNOSIS — I5082 Biventricular heart failure: Secondary | ICD-10-CM

## 2017-11-30 DIAGNOSIS — R339 Retention of urine, unspecified: Secondary | ICD-10-CM | POA: Diagnosis present

## 2017-11-30 DIAGNOSIS — K5900 Constipation, unspecified: Secondary | ICD-10-CM | POA: Diagnosis present

## 2017-11-30 DIAGNOSIS — E785 Hyperlipidemia, unspecified: Secondary | ICD-10-CM | POA: Diagnosis present

## 2017-11-30 DIAGNOSIS — I5022 Chronic systolic (congestive) heart failure: Secondary | ICD-10-CM | POA: Diagnosis present

## 2017-11-30 DIAGNOSIS — I2510 Atherosclerotic heart disease of native coronary artery without angina pectoris: Secondary | ICD-10-CM

## 2017-11-30 DIAGNOSIS — R319 Hematuria, unspecified: Secondary | ICD-10-CM | POA: Diagnosis not present

## 2017-11-30 DIAGNOSIS — N189 Chronic kidney disease, unspecified: Secondary | ICD-10-CM

## 2017-11-30 DIAGNOSIS — R3915 Urgency of urination: Secondary | ICD-10-CM | POA: Insufficient documentation

## 2017-11-30 DIAGNOSIS — N139 Obstructive and reflux uropathy, unspecified: Secondary | ICD-10-CM | POA: Diagnosis present

## 2017-11-30 DIAGNOSIS — R112 Nausea with vomiting, unspecified: Secondary | ICD-10-CM

## 2017-11-30 DIAGNOSIS — I252 Old myocardial infarction: Secondary | ICD-10-CM

## 2017-11-30 DIAGNOSIS — J449 Chronic obstructive pulmonary disease, unspecified: Secondary | ICD-10-CM | POA: Insufficient documentation

## 2017-11-30 DIAGNOSIS — Z79899 Other long term (current) drug therapy: Secondary | ICD-10-CM

## 2017-11-30 DIAGNOSIS — Z6839 Body mass index (BMI) 39.0-39.9, adult: Secondary | ICD-10-CM

## 2017-11-30 DIAGNOSIS — E872 Acidosis: Secondary | ICD-10-CM | POA: Diagnosis present

## 2017-11-30 DIAGNOSIS — E876 Hypokalemia: Secondary | ICD-10-CM | POA: Diagnosis present

## 2017-11-30 DIAGNOSIS — T45515A Adverse effect of anticoagulants, initial encounter: Secondary | ICD-10-CM | POA: Diagnosis present

## 2017-11-30 DIAGNOSIS — K5711 Diverticulosis of small intestine without perforation or abscess with bleeding: Secondary | ICD-10-CM | POA: Diagnosis not present

## 2017-11-30 LAB — CBC WITH DIFFERENTIAL/PLATELET
Basophils Absolute: 0 10*3/uL (ref 0.0–0.1)
Basophils Relative: 0 %
EOS ABS: 0 10*3/uL (ref 0.0–0.7)
Eosinophils Relative: 1 %
HEMATOCRIT: 26.7 % — AB (ref 36.0–46.0)
HEMOGLOBIN: 8.8 g/dL — AB (ref 12.0–15.0)
LYMPHS ABS: 0.6 10*3/uL — AB (ref 0.7–4.0)
Lymphocytes Relative: 9 %
MCH: 29.9 pg (ref 26.0–34.0)
MCHC: 33 g/dL (ref 30.0–36.0)
MCV: 90.8 fL (ref 78.0–100.0)
Monocytes Absolute: 0.3 10*3/uL (ref 0.1–1.0)
Monocytes Relative: 4 %
NEUTROS ABS: 5.6 10*3/uL (ref 1.7–7.7)
NEUTROS PCT: 86 %
Platelets: 163 10*3/uL (ref 150–400)
RBC: 2.94 MIL/uL — ABNORMAL LOW (ref 3.87–5.11)
RDW: 14.6 % (ref 11.5–15.5)
WBC: 6.5 10*3/uL (ref 4.0–10.5)

## 2017-11-30 LAB — URINALYSIS, ROUTINE W REFLEX MICROSCOPIC
Bilirubin Urine: NEGATIVE
Glucose, UA: NEGATIVE mg/dL
Ketones, ur: NEGATIVE mg/dL
Leukocytes, UA: NEGATIVE
Nitrite: NEGATIVE
PROTEIN: NEGATIVE mg/dL
Specific Gravity, Urine: 1.013 (ref 1.005–1.030)
pH: 5 (ref 5.0–8.0)

## 2017-11-30 LAB — COMPREHENSIVE METABOLIC PANEL
ALT: 34 U/L (ref 14–54)
ANION GAP: 12 (ref 5–15)
AST: 35 U/L (ref 15–41)
Albumin: 3.2 g/dL — ABNORMAL LOW (ref 3.5–5.0)
Alkaline Phosphatase: 40 U/L (ref 38–126)
BUN: 164 mg/dL — ABNORMAL HIGH (ref 6–20)
CHLORIDE: 108 mmol/L (ref 101–111)
CO2: 14 mmol/L — AB (ref 22–32)
Calcium: 7.9 mg/dL — ABNORMAL LOW (ref 8.9–10.3)
Creatinine, Ser: 13.1 mg/dL — ABNORMAL HIGH (ref 0.44–1.00)
GFR calc Af Amer: 3 mL/min — ABNORMAL LOW (ref >60)
GFR, EST NON AFRICAN AMERICAN: 3 mL/min — AB (ref >60)
Glucose, Bld: 113 mg/dL — ABNORMAL HIGH (ref 65–99)
POTASSIUM: 4.3 mmol/L (ref 3.5–5.1)
SODIUM: 134 mmol/L — AB (ref 135–145)
Total Bilirubin: 0.8 mg/dL (ref 0.3–1.2)
Total Protein: 6 g/dL — ABNORMAL LOW (ref 6.5–8.1)

## 2017-11-30 LAB — I-STAT CHEM 8, ED
BUN: >140 mg/dL — ABNORMAL HIGH (ref 6–20)
CALCIUM ION: 1.09 mmol/L — AB (ref 1.15–1.40)
CHLORIDE: 109 mmol/L (ref 101–111)
Creatinine, Ser: 14 mg/dL — ABNORMAL HIGH (ref 0.44–1.00)
Glucose, Bld: 107 mg/dL — ABNORMAL HIGH (ref 65–99)
HCT: 25 % — ABNORMAL LOW (ref 36.0–46.0)
Hemoglobin: 8.5 g/dL — ABNORMAL LOW (ref 12.0–15.0)
Potassium: 4.3 mmol/L (ref 3.5–5.1)
SODIUM: 137 mmol/L (ref 135–145)
TCO2: 16 mmol/L — AB (ref 22–32)

## 2017-11-30 LAB — POC OCCULT BLOOD, ED: FECAL OCCULT BLD: POSITIVE — AB

## 2017-11-30 LAB — PROTIME-INR

## 2017-11-30 LAB — I-STAT CG4 LACTIC ACID, ED: LACTIC ACID, VENOUS: 0.51 mmol/L (ref 0.5–1.9)

## 2017-11-30 MED ORDER — SODIUM CHLORIDE 0.9 % IV BOLUS (SEPSIS)
500.0000 mL | Freq: Once | INTRAVENOUS | Status: AC
  Administered 2017-11-30: 500 mL via INTRAVENOUS

## 2017-11-30 MED ORDER — SODIUM CHLORIDE 0.9 % IV BOLUS (SEPSIS): 500.0000 mL | Freq: Once | INTRAVENOUS | Status: DC

## 2017-11-30 MED ORDER — PIPERACILLIN-TAZOBACTAM 3.375 G IVPB: 3.3750 g | Freq: Three times a day (TID) | INTRAVENOUS | Status: DC

## 2017-11-30 MED ORDER — PIPERACILLIN-TAZOBACTAM 3.375 G IVPB 30 MIN
3.3750 g | Freq: Once | INTRAVENOUS | Status: DC
  Filled 2017-11-30: qty 50

## 2017-11-30 MED ORDER — SODIUM CHLORIDE 0.9 % IV BOLUS (SEPSIS)
1000.0000 mL | Freq: Once | INTRAVENOUS | Status: AC
  Administered 2017-11-30: 1000 mL via INTRAVENOUS

## 2017-11-30 MED ORDER — PHYTONADIONE 5 MG PO TABS
5.0000 mg | ORAL_TABLET | Freq: Once | ORAL | Status: AC
  Administered 2017-11-30: 5 mg via ORAL
  Filled 2017-11-30: qty 1

## 2017-11-30 MED ORDER — SODIUM CHLORIDE 0.9 % IV BOLUS (SEPSIS): 1000.0000 mL | Freq: Once | INTRAVENOUS | Status: DC

## 2017-11-30 MED ORDER — DEXTROSE 5 % IV SOLN
1.0000 g | Freq: Once | INTRAVENOUS | Status: AC
  Administered 2017-11-30: 1 g via INTRAVENOUS
  Filled 2017-11-30: qty 10

## 2017-11-30 NOTE — ED Triage Notes (Signed)
Lower abdominal and back pain.  Unable to urinate.  Generalized body aches.

## 2017-11-30 NOTE — ED Notes (Signed)
Patient aware that stool sample needed but patient has not had a bowel movement since this morning.

## 2017-11-30 NOTE — H&P (Signed)
History and Physical    Patricia Wagner RJJ:[id number] DOB: [birth year] DOA: 11/30/2017  PCP: Biagio Borg, MD  Patient coming from:  home  Chief Complaint:   Cannot urinate  HPI: Patricia Wagner is a 67 y.o. female with medical history significant of afib on coumadin, CHF, CAD comes in with over a week of worsening ability to urinate.  No fevers.  She dribbles and cannot fully empty her bladder.  She has been having suprapubic pain and feels discomfort there that became more generalized today.  No n/v.  She is having many loose stools also present in the ED.  In the ED had a bladder scan which showed large amount of urine in bladder, foley was placed and returned over 2 liters of uop.  Pt symptoms have resolved since foley placed, pain is gone however she is still having loose stools.  She did recently have abx for a uti.  Referred for admission for AKI with cr over 14 with normal k.  Nephrology called and they will see in the am.  No sob or chest pain.  Review of Systems: As per HPI otherwise 10 point review of systems negative.   Past Medical History:  Diagnosis Date  . Arthritis   . Atrial fibrillation ([place])    a. s/p multiple cardioversions; failed tikosyn/sotalol.  Marland Kitchen CAD in native artery, s/p cardiac cath with non obstructive CAD 10/24/2016  . CHF (congestive heart failure) ([place])   . DIVERTICULITIS, HX OF 07/25/2007  . DIVERTICULOSIS, COLON 07/22/2007  . Dyspnea   . Edema, peripheral    a. chronic BLE edema, R>L. Prior trauma from dog attack and accident.  Marland Kitchen HYPERLIPIDEMIA 02/03/2008  . Hypersomnia    declines w/u  . Hypertension   . MENOPAUSAL DISORDER 01/09/2011  . Morbid obesity ([place]) 07/22/2007  . NICM (nonischemic cardiomyopathy) ([place]) 10/24/2016  . Raynaud's syndrome 07/22/2007  . Stroke ([place]) 2017  . THYROID NODULE, RIGHT 01/04/2010  . VITAMIN D DEFICIENCY 01/09/2011   Qualifier: Diagnosis of  By: [name], [name]     Past Surgical History:  Procedure Laterality Date  . CARDIAC  CATHETERIZATION N/A 10/23/2016   Procedure: Left Heart Cath and Coronary Angiography;  Surgeon: [name], MD;  Location: [place] [facility];  Service: Cardiovascular;  Laterality: N/A;  . CARDIOVERSION N/A 10/31/2016   Procedure: CARDIOVERSION;  Surgeon: [name], MD;  Location: [place];  Service: Cardiovascular;  Laterality: N/A;  . CARDIOVERSION N/A 11/03/2016   Procedure: CARDIOVERSION;  Surgeon: [name], MD;  Location: [place];  Service: Cardiovascular;  Laterality: N/A;  . CARDIOVERSION N/A 11/18/2016   Procedure: CARDIOVERSION;  Surgeon: [name], MD;  Location: [facility] ENDOSCOPY;  Service: Cardiovascular;  Laterality: N/A;  . CARDIOVERSION N/A 03/25/2017   Procedure: CARDIOVERSION;  Surgeon: [name], MD;  Location: [facility] ENDOSCOPY;  Service: Cardiovascular;  Laterality: N/A;  . CARDIOVERSION N/A 05/06/2017   Procedure: CARDIOVERSION;  Surgeon: [name], MD;  Location: [facility] ENDOSCOPY;  Service: Cardiovascular;  Laterality: N/A;  . CARDIOVERSION N/A 05/12/2017   Procedure: CARDIOVERSION;  Surgeon: [name], MD;  Location: [facility] ENDOSCOPY;  Service: Cardiovascular;  Laterality: N/A;  . CHOLECYSTECTOMY    . COLONOSCOPY    . PARTIAL HYSTERECTOMY     1 OVARY LEFT  . POLYPECTOMY    . TEE WITHOUT CARDIOVERSION N/A 10/31/2016   Procedure: TRANSESOPHAGEAL ECHOCARDIOGRAM (TEE);  Surgeon: [name], MD;  Location: [place];  Service: Cardiovascular;  Laterality:  N/A;  . TEE WITHOUT CARDIOVERSION N/A 03/25/2017   Procedure: TRANSESOPHAGEAL ECHOCARDIOGRAM (TEE);  Surgeon: [name], MD;  Location: [facility] ENDOSCOPY;  Service: Cardiovascular;  Laterality: N/A;     reports that she has been smoking cigarettes.  She has been smoking about 0.50 packs per day. she has never used smokeless tobacco. She reports that she drinks about 2.4 oz of alcohol per week. She reports that she does not use drugs.  Allergies  Allergen Reactions  . Ace  Inhibitors Palpitations    Family History  Problem Relation Age of Onset  . Asthma Mother   . Diabetes Father   . Heart disease Father        Died of presumed heart attack - 63s  . Lung disease Sister   . Heart disease Sister        Twin sister has heart issue, unclear what kind  . Thyroid disease Neg Hx     Prior to Admission medications   Medication Sig Start Date End Date Taking? Authorizing Provider  acetaminophen (TYLENOL) 325 MG tablet Take 650 mg by mouth every 6 (six) hours as needed (pain).    Yes [provider]  amiodarone (PACERONE) 200 MG tablet Take 1 tablet (200 mg total) by mouth daily. 05/29/17  Yes Shirley Friar, PA-C  carvedilol (COREG) 3.125 MG tablet Take 1 tablet (3.125 mg total) by mouth 2 (two) times daily. 07/09/17 11/30/17 Yes [name], MD  diclofenac sodium (VOLTAREN) 1 % GEL Apply 4 g topically 4 (four) times daily as needed (As directed for skin). Patient taking differently: Apply 4 g topically 4 (four) times daily as needed (for pain).  10/23/17  Yes Strader, [place], PA-C  furosemide (LASIX) 40 MG tablet Take 20 mg by mouth daily.   Yes [provider]  Investigational - Study Medication Take 1 tablet by mouth 2 (two) times daily. Study name: Galactic HF Study  Additional study details: Omecamtiv Mecarbil or Placebo 09/17/17  Yes Larey Dresser, MD  isosorbide-hydrALAZINE (BIDIL) 20-37.5 MG tablet Take 1 tablet by mouth 3 (three) times daily. Patient taking differently: Take 1 tablet by mouth 2 (two) times daily.  10/20/17  Yes [name], MD  ranolazine (RANEXA) 500 MG 12 hr tablet Take 1 tablet (500 mg total) by mouth 2 (two) times daily. 09/14/17  Yes Bensimhon, Shaune Pascal, MD  rosuvastatin (CRESTOR) 10 MG tablet TAKE 1 TABLET (10 MG TOTAL) BY MOUTH DAILY. 10/07/17  Yes Biagio Borg, MD  sacubitril-valsartan (ENTRESTO) 49-51 MG Take 1 tablet by mouth 2 (two) times daily. 08/19/17  Yes Bensimhon, Shaune Pascal, MD    spironolactone (ALDACTONE) 25 MG tablet Take 0.5 tablets (12.5 mg total) by mouth daily. 10/23/17  Yes Strader, Fransisco Hertz, PA-C  tamsulosin (FLOMAX) 0.4 MG CAPS capsule Take 1 capsule (0.4 mg total) by mouth daily. 11/23/17  Yes Biagio Borg, MD  warfarin (COUMADIN) 3 MG tablet TAKE 1 TABLET BY MOUTH DAILY OR AS DIRECTED BY COUMADIN CLINIC Patient taking differently: Take 3 mg by mouth at bedtime on Sun/Mon/Tues/Thurs/Fri/Sat and 4.5 mg on Wed 08/10/17  Yes [name], Denice Bors, MD  furosemide (LASIX) 20 MG tablet Take 1 tablet (20 mg total) by mouth daily. Patient not taking: Reported on 11/30/2017 10/27/17   Erma Heritage, PA-C    Physical Exam: Vitals:   11/30/17 1730 11/30/17 1845 11/30/17 1930 11/30/17 2010  BP: (!) 98/59 (!) 105/43 (!) 95/59 93/61  Pulse: 61 65 65  Resp: 16 17 15    Temp:      TempSrc:      SpO2: 97% 97% 94%   Weight:      Height:          Constitutional: NAD, calm, comfortable Vitals:   11/30/17 1730 11/30/17 1845 11/30/17 1930 11/30/17 2010  BP: (!) 98/59 (!) 105/43 (!) 95/59 93/61  Pulse: 61 65 65   Resp: 16 17 15    Temp:      TempSrc:      SpO2: 97% 97% 94%   Weight:      Height:       Eyes: PERRL, lids and conjunctivae normal ENMT: Mucous membranes are moist. Posterior pharynx clear of any exudate or lesions.Normal dentition.  Neck: normal, supple, no masses, no thyromegaly Respiratory: clear to auscultation bilaterally, no wheezing, no crackles. Normal respiratory effort. No accessory muscle use.  Cardiovascular: Regular rate and rhythm, no murmurs / rubs / gallops. No extremity edema. 2+ pedal pulses. No carotid bruits.  Abdomen: no tenderness, no masses palpated. No hepatosplenomegaly. Bowel sounds positive.  Musculoskeletal: no clubbing / cyanosis. No joint deformity upper and lower extremities. Good ROM, no contractures. Normal muscle tone.  Skin: no rashes, lesions, ulcers. No induration Neurologic: CN 2-12 grossly intact. Sensation  intact, DTR normal. Strength 5/5 in all 4.  Psychiatric: Normal judgment and insight. Alert and oriented x 3. Normal mood.    Labs on Admission: I have personally reviewed following labs and imaging studies  CBC: Recent Labs  Lab 11/30/17 1632 11/30/17 1645  WBC 6.5  --   NEUTROABS 5.6  --   HGB 8.8* 8.5*  HCT 26.7* 25.0*  MCV 90.8  --   PLT 163  --    Basic Metabolic Panel: Recent Labs  Lab 11/30/17 1632 11/30/17 1645  NA 134* 137  K 4.3 4.3  CL 108 109  CO2 14*  --   GLUCOSE 113* 107*  BUN 164* >140*  CREATININE 13.10* 14.00*  CALCIUM 7.9*  --    GFR: Estimated Creatinine Clearance: 5.1 mL/min (A) (by C-G formula based on SCr of 14 mg/dL (H)). Liver Function Tests: Recent Labs  Lab 11/30/17 1632  AST 35  ALT 34  ALKPHOS 40  BILITOT 0.8  PROT 6.0*  ALBUMIN 3.2*   No results for input(s): LIPASE, AMYLASE in the last 168 hours. No results for input(s): AMMONIA in the last 168 hours. Coagulation Profile: Recent Labs  Lab 11/30/17 1632  INR >10.00*   Cardiac Enzymes: No results for input(s): CKTOTAL, CKMB, CKMBINDEX, TROPONINI in the last 168 hours. BNP (last 3 results) No results for input(s): PROBNP in the last 8760 hours. HbA1C: No results for input(s): HGBA1C in the last 72 hours. CBG: No results for input(s): GLUCAP in the last 168 hours. Lipid Profile: No results for input(s): CHOL, HDL, LDLCALC, TRIG, CHOLHDL, LDLDIRECT in the last 72 hours. Thyroid Function Tests: No results for input(s): TSH, T4TOTAL, FREET4, T3FREE, THYROIDAB in the last 72 hours. Anemia Panel: No results for input(s): VITAMINB12, FOLATE, FERRITIN, TIBC, IRON, RETICCTPCT in the last 72 hours. Urine analysis:    Component Value Date/Time   COLORURINE YELLOW 11/30/2017 [place] 11/30/2017 1653   LABSPEC 1.013 11/30/2017 1653   PHURINE 5.0 11/30/2017 1653   GLUCOSEU NEGATIVE 11/30/2017 1653   GLUCOSEU NEGATIVE 11/10/2017 1216   HGBUR MODERATE (A)  11/30/2017 1653   BILIRUBINUR NEGATIVE 11/30/2017 [place] 11/30/2017 1653   PROTEINUR NEGATIVE 11/30/2017  1653   UROBILINOGEN 0.2 11/10/2017 1216   NITRITE NEGATIVE 11/30/2017 1653   LEUKOCYTESUR NEGATIVE 11/30/2017 1653   Sepsis Labs: !!!!!!!!!!!!!!!!!!!!!!!!!!!!!!!!!!!!!!!!!!!! @LABRCNTIP (procalcitonin:4,lacticidven:4) )No results found for this or any previous visit (from the past 240 hour(s)).   Radiological Exams on Admission: Dg Chest Port 1 View  Result Date: 11/30/2017 CLINICAL DATA:  Sepsis and abdominal pain EXAM: PORTABLE CHEST 1 VIEW COMPARISON:  Chest radiograph 10/22/2017 FINDINGS: Mild cardiomegaly, unchanged. No focal airspace consolidation. No pneumothorax or sizable pleural effusion. No pulmonary edema. Mild aortic arch calcific atherosclerosis. IMPRESSION: Unchanged cardiomegaly and aortic atherosclerosis (ICD10-I70.0) without focal airspace disease. Electronically Signed   By: Ulyses Jarred M.D.   On: 11/30/2017 16:44    EKG: Independently reviewed.  nsr , nonsp ivcd, prol pr Old chart reviewed Case discussed with edp   Assessment/Plan 67 yo female with likely postobstructive renal failure now with good uop after foley placement  Principal Problem:   Acute renal failure superimposed on chronic kidney disease ([place])- cont foley.  Nephrology to see in am.  Obtain renal ultrasound.  ua does not look infected.  Pt bp was low and then improved after foley placed.  Cont ivf overnight.  Lactic acid is normal  Active Problems:   Subtherapeutic international normalized ratio (INR)- inr over 10.  Vit k given in ED.  Repeat in am.  She is heme positive but no overt bleeding.   Diarrhea- obtain cdiff   Essential hypertension- holding all bp meds due to hypotension in ED resolving with ivf   Raynaud's syndrome- noted   CAD in native artery, s/p cardiac cath with non obstructive CAD- stable   CHF (congestive heart failure) ([place])- EFaround 30%.  Holding lasix  due to above issues mainly hypotension   Cerebrovascular accident (CVA) due to embolism of left cerebellar artery ([place])- noted   Atrial fibrillation ([place])- holding coumadin due to inr over 10   Admit to stepdown   DVT prophylaxis:   inr over 10  Code Status:  full Family Communication:  daughter Disposition Plan:  Per day team Consults called:  nephrology Admission status:  admission   Billiejo Sorto A MD [facility]  If 7PM-7AM, please contact night-coverage [email or web address] Password [facility]  11/30/2017, 8:43 PM

## 2017-11-30 NOTE — Progress Notes (Signed)
Pharmacy Antibiotic Note  Patricia Wagner is a 67 y.o. female admitted on 11/30/2017 with sepsis 2/2 intra-abdominal infection. Pharmacy has been consulted for Zosyn dosing. Afebrile. Hypotensive with BP 64/37. Completed course of Cipro x 10 days for UTI last week. CKD III (Scr pending), WBC pending  Plan: Zosyn 3.375g IV x1 (per EDP) Zosyn 3.375g IV q8h (4 hour infusion).  F/u renal fxn, clinical resolution, and daily CBC and BMET  Temp (24hrs), Avg:97.8 F (36.6 C), Min:97.8 F (36.6 C), Max:97.8 F (36.6 C)  No results for input(s): WBC, CREATININE, LATICACIDVEN, VANCOTROUGH, VANCOPEAK, VANCORANDOM, GENTTROUGH, GENTPEAK, GENTRANDOM, TOBRATROUGH, TOBRAPEAK, TOBRARND, AMIKACINPEAK, AMIKACINTROU, AMIKACIN in the last 168 hours.  CrCl cannot be calculated (Patient's most recent lab result is older than the maximum 21 days allowed.).    Allergies  Allergen Reactions  . Ace Inhibitors Palpitations   Antimicrobials this admission: Zosyn 1/7 >>   Microbiology results: Pending  Thank you for allowing pharmacy to be a part of this patient's care.  Elia Nunley 11/30/2017 4:28 PM

## 2017-11-30 NOTE — ED Notes (Signed)
Paged Dr. Shanon Brow regarding hypotension.

## 2017-11-30 NOTE — Progress Notes (Signed)
Advanced Heart Failure Clinic Note    Primary Cardiologist: Dr. [name] Primary HF: [name]   HPI:  Patricia Wagner is a 65 y.o. female 67 year old female with a past medical history of persistent atrial fibrillation, NICM cardiomyopathy, Biventricular CHF (LVEF 15% and RV severely reduced by TEE 03/25/17), HTN, and HLD.   She has struggled with symptomatic afib since 10/2016. Had successful DCCV 10/2016. Failed DCCV 03/2017, after stopping her amiodarone. Seen in A-Fib clinic 04/09/17 and planned for repeat DCCV after further amio load.   Pt admitted to [facility] 6/6 - 05/16/17 with cardiogenic shock in the setting of persistent AF. EF 15% with severe biventricular dysfunction. Started on amio gtt and PICC line placed for dobutamine with low output HF. Underwent DCCV 05/06/17 but went back into Afib 05/08/17 and developed recurrent cardiogenic shock, requiring re-initiation of dobutamine and amio. Pt had repeat DCCV 05/12/17 with successful conversion to NSR. Amio gtt continued to ensure adequate load. Transitioned to po 05/15/17. Ranexa also added to her regimen for added anti-arrhythmic benefit. HF meds optimized as tolerated.  PT recommended SNF, but pt preferred to return home with HHPT.   Last echocardiogram August 2018 showed ejection fraction 30-35%,severe left ventricular hypertrophy, mild to moderate mitral regurgitation, biatrial enlargement and moderately reduced RV function. Fat pad bx negative. Nuclear study 10/18 felt equivocal but not favored for amyloid. Patient has been scheduled for atrial fibrillation ablation January 2019 as she has had cardiogenic shock with atrial fibrillation previously.   Pt presents today for follow up. Says she was doing well for past few months but a week or two ago couldn't urinate so PCP treated with abx for 10 days which she completed last week. Since Saturday has felt weak and nauseated. Has gained about 9 pounds over last few month. + mild edema. No  diarrhea. + chills. Urine malodorous with incontinence and urgency. Holding emesis bag in clinic    Echo from 07/15/17 EF 30-35% Severe LVH. RV down. Personally reviewed     Past Medical History:  Diagnosis Date  . Arthritis   . Atrial fibrillation ([place])    a. s/p multiple cardioversions; failed tikosyn/sotalol.  Marland Kitchen CAD in native artery, s/p cardiac cath with non obstructive CAD 10/24/2016  . CHF (congestive heart failure) ([place])   . DIVERTICULITIS, HX OF 07/25/2007  . DIVERTICULOSIS, COLON 07/22/2007  . Dyspnea   . Edema, peripheral    a. chronic BLE edema, R>L. Prior trauma from dog attack and accident.  Marland Kitchen HYPERLIPIDEMIA 02/03/2008  . Hypersomnia    declines w/u  . Hypertension   . MENOPAUSAL DISORDER 01/09/2011  . Morbid obesity ([place]) 07/22/2007  . NICM (nonischemic cardiomyopathy) ([place]) 10/24/2016  . Raynaud's syndrome 07/22/2007  . Stroke ([place]) 2017  . THYROID NODULE, RIGHT 01/04/2010  . VITAMIN D DEFICIENCY 01/09/2011   Qualifier: Diagnosis of  By: [name], [name]     Current Outpatient Medications  Medication Sig Dispense Refill  . acetaminophen (TYLENOL) 325 MG tablet Take 650 mg by mouth every 6 (six) hours as needed (pain).     Marland Kitchen amiodarone (PACERONE) 200 MG tablet Take 1 tablet (200 mg total) by mouth daily. 30 tablet 6  . carvedilol (COREG) 3.125 MG tablet Take 1 tablet (3.125 mg total) by mouth 2 (two) times daily. 180 tablet 3  . diclofenac sodium (VOLTAREN) 1 % GEL Apply 4 g topically 4 (four) times daily as needed (As directed for skin). 100 g 0  . furosemide (LASIX) 20  MG tablet Take 1 tablet (20 mg total) by mouth daily.    . Investigational - Study Medication Take 1 tablet by mouth 2 (two) times daily. Study name: Galactic HF Study  Additional study details: Omecamtiv Mecarbil or Placebo 1 each PRN  . isosorbide-hydrALAZINE (BIDIL) 20-37.5 MG tablet Take 1 tablet by mouth 3 (three) times daily. 180 tablet 11  . ranolazine (RANEXA) 500 MG 12 hr tablet Take 1 tablet  (500 mg total) by mouth 2 (two) times daily. 60 tablet 5  . rosuvastatin (CRESTOR) 10 MG tablet TAKE 1 TABLET (10 MG TOTAL) BY MOUTH DAILY. 90 tablet 2  . sacubitril-valsartan (ENTRESTO) 49-51 MG Take 1 tablet by mouth 2 (two) times daily. 60 tablet 3  . spironolactone (ALDACTONE) 25 MG tablet Take 0.5 tablets (12.5 mg total) by mouth daily. 45 tablet 3  . tamsulosin (FLOMAX) 0.4 MG CAPS capsule Take 1 capsule (0.4 mg total) by mouth daily. 30 capsule 1  . warfarin (COUMADIN) 3 MG tablet TAKE 1 TABLET BY MOUTH DAILY OR AS DIRECTED BY COUMADIN CLINIC 30 tablet 3   No current facility-administered medications for this encounter.     Allergies  Allergen Reactions  . Ace Inhibitors Palpitations     Social History   Socioeconomic History  . Marital status: Single    Spouse name: Not on file  . Number of children: 1  . Years of education: Not on file  . Highest education level: Not on file  Social Needs  . Financial resource strain: Not on file  . Food insecurity - worry: Not on file  . Food insecurity - inability: Not on file  . Transportation needs - medical: Not on file  . Transportation needs - non-medical: Not on file  Occupational History  . Occupation: Academic librarian   Tobacco Use  . Smoking status: Current Every Day Smoker    Packs/day: 0.50    Types: Cigarettes    Last attempt to quit: 10/23/2016    Years since quitting: 1.1  . Smokeless tobacco: Never Used  . Tobacco comment: Smoked for 50 years  Substance and Sexual Activity  . Alcohol use: Yes    Alcohol/week: 2.4 oz    Types: 4 Cans of beer per week    Comment: Intermittent  . Drug use: No  . Sexual activity: Not on file  Other Topics Concern  . Not on file  Social History Narrative  . Not on file     Family History  Problem Relation Age of Onset  . Asthma Mother   . Diabetes Father   . Heart disease Father        Died of presumed heart attack - 60s  . Lung disease Sister   . Heart disease Sister         Twin sister has heart issue, unclear what kind  . Thyroid disease Neg Hx     Vitals:   11/30/17 1507  BP: (!) 91/57  Pulse: 67  SpO2: 96%  Weight: 247 lb 8 oz (112.3 kg)    Wt Readings from Last 3 Encounters:  11/30/17 247 lb 8 oz (112.3 kg)  11/23/17 243 lb 1.9 oz (110.3 kg)  11/10/17 238 lb (108 kg)    PHYSICAL EXAM: General:  Weak appearing. Uncomfortable No resp difficulty HEENT: normal Neck: supple. no JVD. Carotids 2+ bilat; no bruits. No lymphadenopathy or thryomegaly appreciated. Cor: PMI nondisplaced. Regular rate & rhythm. +s4 Lungs: clear Abdomen: obese soft, nontender, nondistended. No hepatosplenomegaly. No bruits or  masses. Good bowel sounds. Extremities: no cyanosis, clubbing, rash, edema Neuro: alert & orientedx3, cranial nerves grossly intact. moves all 4 extremities w/o difficulty. Affect pleasant    ASSESSMENT & PLAN:  1. Nausea and urinary urgency - she looks weak and volume depleted. BP low. Having chills - will send to ER for further infectious work-up including CBC, UA and CX, BCX  2. Chronic biventricular CHF due to NICM - Much improved NYHA II-III at baseline  - Echo  8/18 EF 30-35% (up from 15% in setting of AF in 5/18)  - TEE 03/25/17 with LVEF 15% and severely reduced RV function.   - Remains in NSR today. Does not tolerate AF well with recurrent shock but no AF today.  - Required dobutamine during previous admission with cardiogenic shock in setting of Afib RVR - Scheduled for AF ablation with Dr. Curt Bears - Amyloid w/u negative with negative fat pad biopsy and PYP scan (suspect AF cardiomyopathy)  - Continue Entresto 49/51 and spironolactone 25 mg daily - Continue Bidil 2 tabs TID.  - Continue lasix - May have to hold HF meds with low BP in setting of possible infection .Await ER w/u   3. Persistent Afib: Cannot tolerate AF with recurrent shock. S/p DCCV on 5/2 (she stopped Amio after this) and DCCV on 05/06/17 and again 05/12/17. - Remains in  NSR today.  - Continue Ranexa 500 mg BID.  - Continue amiodarone 200 mg daily.  - Continue warfarin. Follows with coumadin Clinic  - She absolutely cannot tolerate AF with recurrent cardiogenic shock when AF recurs. She was seen by Dr. Curt Bears in the hospital. Now pending AF ablation   4. Suspected Sleep Apnea: - Sleep study completed. Unable to locate results in Epic  5. CKD III: - BMET today in ER.   6. COPD:  - No wheezing.  Glori Bickers, MD 11/30/17

## 2017-11-30 NOTE — ED Provider Notes (Signed)
[facility] EMERGENCY DEPARTMENT Provider Note   CSN: [id number] Arrival date & time: 11/30/17  1538     History   Chief Complaint No chief complaint on file.   HPI Patricia Wagner is a 67 y.o. female.  HPI  67 year old female presents with a chief complaint of abdominal pain.  She is an overall poor historian.  She states she has been having left flank pain and some abdominal pain for the past 3 days or so.  She vomited once about 2 days ago but none since.  However she has had nausea and some chills but no fevers.  She denies any cough or shortness of breath.  No chest pain.  The abdominal pain is now mostly diffuse lower abdomen in addition to her left flank.  She has been constipated for about a week, tried some over-the-counter medicines but it did not sit well with her so she stopped.  She has been having small amounts of liquid stool but no normal stool over this 1 week or so.  No dysuria or hematuria but she has been having decreased urine output overall.  She urinated shortly before coming here but states it was only a small amount and nowhere near typical for her.  Patient was treated for a kidney/bladder infection and finished antibiotics about 10-14 days ago.  Past Medical History:  Diagnosis Date  . Arthritis   . Atrial fibrillation ([place])    a. s/p multiple cardioversions; failed tikosyn/sotalol.  Marland Kitchen CAD in native artery, s/p cardiac cath with non obstructive CAD 10/24/2016  . CHF (congestive heart failure) ([place])   . DIVERTICULITIS, HX OF 07/25/2007  . DIVERTICULOSIS, COLON 07/22/2007  . Dyspnea   . Edema, peripheral    a. chronic BLE edema, R>L. Prior trauma from dog attack and accident.  Marland Kitchen HYPERLIPIDEMIA 02/03/2008  . Hypersomnia    declines w/u  . Hypertension   . MENOPAUSAL DISORDER 01/09/2011  . Morbid obesity ([place]) 07/22/2007  . NICM (nonischemic cardiomyopathy) ([place]) 10/24/2016  . Raynaud's syndrome 07/22/2007  . Stroke ([place]) 2017  . THYROID NODULE,  RIGHT 01/04/2010  . VITAMIN D DEFICIENCY 01/09/2011   Qualifier: Diagnosis of  By: [name], [name]     Patient Active Problem List   Diagnosis Date Noted  . Urinary retention 11/23/2017  . Lower abdominal pain 11/23/2017  . Dysuria 07/21/2017  . Suspected sleep apnea 05/20/2017  . CKD (chronic kidney disease), stage III ([place]) 05/20/2017  . Pressure injury of skin 05/02/2017  . Acute hypercapnic respiratory failure ([place]) 04/30/2017  . Hypotension (arterial) 04/30/2017  . Atrial fibrillation ([place])   . Bradycardia 11/20/2016  . Stroke ([place]) 11/20/2016  . Cerebrovascular accident (CVA) due to embolism of left cerebellar artery ([place])   . Persistent atrial fibrillation ([place]) 11/03/2016  . Paroxysmal atrial fibrillation ([facility])   . CHF (congestive heart failure) ([place]) 10/31/2016  . Nonischemic cardiomyopathy ([place]) 10/24/2016  . CAD in native artery, s/p cardiac cath with non obstructive CAD 10/24/2016  . Tobacco abuse 10/20/2016  . Acute renal failure superimposed on chronic kidney disease ([place]) 10/20/2016  . Hematochezia 06/29/2013  . Degenerative arthritis of left knee 03/15/2012  . Encounter for well adult exam with abnormal findings 03/12/2012  . Hypersomnia   . VITAMIN D DEFICIENCY 01/09/2011  . MENOPAUSAL DISORDER 01/09/2011  . THYROID NODULE, RIGHT 01/04/2010  . HLD (hyperlipidemia) 02/03/2008  . LEG CRAMPS, NOCTURNAL 02/03/2008  . LOW BACK PAIN 07/25/2007  . Morbid obesity ([place]) 07/22/2007  .  Essential hypertension 07/22/2007  . Raynaud's syndrome 07/22/2007  . DIVERTICULOSIS, COLON 07/22/2007  . SYMPTOM, EDEMA 07/22/2007    Past Surgical History:  Procedure Laterality Date  . CARDIAC CATHETERIZATION N/A 10/23/2016   Procedure: Left Heart Cath and Coronary Angiography;  Surgeon: [name], MD;  Location: [facility];  Service: Cardiovascular;  Laterality: N/A;  . CARDIOVERSION N/A 10/31/2016   Procedure: CARDIOVERSION;  Surgeon: [name], MD;  Location:  [facility];  Service: Cardiovascular;  Laterality: N/A;  . CARDIOVERSION N/A 11/03/2016   Procedure: CARDIOVERSION;  Surgeon: [name], MD;  Location: [facility];  Service: Cardiovascular;  Laterality: N/A;  . CARDIOVERSION N/A 11/18/2016   Procedure: CARDIOVERSION;  Surgeon: [name], MD;  Location: [facility] ENDOSCOPY;  Service: Cardiovascular;  Laterality: N/A;  . CARDIOVERSION N/A 03/25/2017   Procedure: CARDIOVERSION;  Surgeon: [name], MD;  Location: [facility] ENDOSCOPY;  Service: Cardiovascular;  Laterality: N/A;  . CARDIOVERSION N/A 05/06/2017   Procedure: CARDIOVERSION;  Surgeon: [name], MD;  Location: [facility] ENDOSCOPY;  Service: Cardiovascular;  Laterality: N/A;  . CARDIOVERSION N/A 05/12/2017   Procedure: CARDIOVERSION;  Surgeon: [name], MD;  Location: [facility] ENDOSCOPY;  Service: Cardiovascular;  Laterality: N/A;  . CHOLECYSTECTOMY    . COLONOSCOPY    . PARTIAL HYSTERECTOMY     1 OVARY LEFT  . POLYPECTOMY    . TEE WITHOUT CARDIOVERSION N/A 10/31/2016   Procedure: TRANSESOPHAGEAL ECHOCARDIOGRAM (TEE);  Surgeon: [name], MD;  Location: [facility] ENDOSCOPY;  Service: Cardiovascular;  Laterality: N/A;  . TEE WITHOUT CARDIOVERSION N/A 03/25/2017   Procedure: TRANSESOPHAGEAL ECHOCARDIOGRAM (TEE);  Surgeon: [name], MD;  Location: [facility] ENDOSCOPY;  Service: Cardiovascular;  Laterality: N/A;    OB History    No data available       Home Medications    Prior to Admission medications   Medication Sig Start Date End Date Taking? Authorizing Provider  acetaminophen (TYLENOL) 325 MG tablet Take 650 mg by mouth every 6 (six) hours as needed (pain).     [provider]  amiodarone (PACERONE) 200 MG tablet Take 1 tablet (200 mg total) by mouth daily. 05/29/17   Shirley Friar, PA-C  carvedilol (COREG) 3.125 MG tablet Take 1 tablet (3.125 mg total) by mouth 2 (two) times daily. 07/09/17 11/30/17  [name], MD  diclofenac sodium  (VOLTAREN) 1 % GEL Apply 4 g topically 4 (four) times daily as needed (As directed for skin). 10/23/17   Strader, Fransisco Hertz, PA-C  furosemide (LASIX) 20 MG tablet Take 1 tablet (20 mg total) by mouth daily. 10/27/17   Erma Heritage, PA-C  Investigational - Study Medication Take 1 tablet by mouth 2 (two) times daily. Study name: Galactic HF Study  Additional study details: Omecamtiv Mecarbil or Placebo 09/17/17   Larey Dresser, MD  isosorbide-hydrALAZINE (BIDIL) 20-37.5 MG tablet Take 1 tablet by mouth 3 (three) times daily. 10/20/17   [name], MD  ranolazine (RANEXA) 500 MG 12 hr tablet Take 1 tablet (500 mg total) by mouth 2 (two) times daily. 09/14/17   Bensimhon, Shaune Pascal, MD  rosuvastatin (CRESTOR) 10 MG tablet TAKE 1 TABLET (10 MG TOTAL) BY MOUTH DAILY. 10/07/17   Biagio Borg, MD  sacubitril-valsartan (ENTRESTO) 49-51 MG Take 1 tablet by mouth 2 (two) times daily. 08/19/17   Bensimhon, Shaune Pascal, MD  spironolactone (ALDACTONE) 25 MG tablet Take 0.5 tablets (12.5 mg total) by mouth daily. 10/23/17   Strader,  [place] M, PA-C  tamsulosin (FLOMAX) 0.4 MG CAPS capsule Take 1 capsule (0.4 mg total) by mouth daily. 11/23/17   Biagio Borg, MD  warfarin (COUMADIN) 3 MG tablet TAKE 1 TABLET BY MOUTH DAILY OR AS DIRECTED BY COUMADIN CLINIC 08/10/17   [name], MD    Family History Family History  Problem Relation Age of Onset  . Asthma Mother   . Diabetes Father   . Heart disease Father        Died of presumed heart attack - 84s  . Lung disease Sister   . Heart disease Sister        Twin sister has heart issue, unclear what kind  . Thyroid disease Neg Hx     Social History Social History   Tobacco Use  . Smoking status: Current Every Day Smoker    Packs/day: 0.50    Types: Cigarettes    Last attempt to quit: 10/23/2016    Years since quitting: 1.1  . Smokeless tobacco: Never Used  . Tobacco comment: Smoked for 50 years  Substance Use Topics  . Alcohol  use: Yes    Alcohol/week: 2.4 oz    Types: 4 Cans of beer per week    Comment: Intermittent  . Drug use: No     Allergies   Ace inhibitors   Review of Systems Review of Systems  Constitutional: Negative for fever.  Respiratory: Negative for shortness of breath.   Cardiovascular: Negative for chest pain.  Gastrointestinal: Positive for abdominal pain, constipation, nausea and vomiting.  Genitourinary: Positive for decreased urine volume and flank pain. Negative for dysuria and hematuria.  All other systems reviewed and are negative.    Physical Exam Updated Vital Signs BP (!) 64/37 (BP Location: Left Arm)   Pulse 60   Temp 97.8 F (36.6 C) (Oral)   Resp 19   SpO2 100%   Physical Exam  Constitutional: She is oriented to person, place, and time. She appears well-developed and well-nourished.  HENT:  Head: Normocephalic and atraumatic.  Right Ear: External ear normal.  Left Ear: External ear normal.  Nose: Nose normal.  Eyes: Right eye exhibits no discharge. Left eye exhibits no discharge.  Cardiovascular: Normal rate, regular rhythm and normal heart sounds.  Pulmonary/Chest: Effort normal and breath sounds normal.  Abdominal: Soft. She exhibits distension. There is tenderness. There is CVA tenderness (left).    Neurological: She is alert and oriented to person, place, and time.  Skin: Skin is warm and dry. She is not diaphoretic.  Nursing note and vitals reviewed.    ED Treatments / Results  Labs (all labs ordered are listed, but only abnormal results are displayed) Labs Reviewed  COMPREHENSIVE METABOLIC PANEL - Abnormal; Notable for the following components:      Result Value   Sodium 134 (*)    CO2 14 (*)    Glucose, Bld 113 (*)    BUN 164 (*)    Creatinine, Ser 13.10 (*)    Calcium 7.9 (*)    Total Protein 6.0 (*)    Albumin 3.2 (*)    GFR calc non Af Amer 3 (*)    GFR calc Af Amer 3 (*)    All other components within normal limits  CBC WITH  DIFFERENTIAL/PLATELET - Abnormal; Notable for the following components:   RBC 2.94 (*)    Hemoglobin 8.8 (*)    HCT 26.7 (*)    Lymphs Abs 0.6 (*)    All other  components within normal limits  URINALYSIS, ROUTINE W REFLEX MICROSCOPIC - Abnormal; Notable for the following components:   Hgb urine dipstick MODERATE (*)    Bacteria, UA RARE (*)    Squamous Epithelial / LPF 0-5 (*)    All other components within normal limits  PROTIME-INR - Abnormal; Notable for the following components:   Prothrombin Time >90.0 (*)    INR >10.00 (*)    All other components within normal limits  I-STAT CHEM 8, ED - Abnormal; Notable for the following components:   BUN >140 (*)    Creatinine, Ser 14.00 (*)    Glucose, Bld 107 (*)    Calcium, Ion 1.09 (*)    TCO2 16 (*)    Hemoglobin 8.5 (*)    HCT 25.0 (*)    All other components within normal limits  POC OCCULT BLOOD, ED - Abnormal; Notable for the following components:   Fecal Occult Bld POSITIVE (*)    All other components within normal limits  CULTURE, BLOOD (ROUTINE X 2)  CULTURE, BLOOD (ROUTINE X 2)  URINE CULTURE  C DIFFICILE QUICK SCREEN W PCR REFLEX  I-STAT CG4 LACTIC ACID, ED  I-STAT CG4 LACTIC ACID, ED  TYPE AND SCREEN  ABO/RH    EKG  EKG Interpretation  Date/Time:  Monday November 30 2017 17:25:36 EST Ventricular Rate:  63 PR Interval:    QRS Duration: 180 QT Interval:  505 QTC Calculation: 517 R Axis:   94 Text Interpretation:  Sinus rhythm Prolonged PR interval Nonspecific intraventricular conduction delay QRS wider compared to Sept 2018 Confirmed by Sherwood Gambler [id number]) on 11/30/2017 7:31:47 PM       Radiology Dg Chest Port 1 View  Result Date: 11/30/2017 CLINICAL DATA:  Sepsis and abdominal pain EXAM: PORTABLE CHEST 1 VIEW COMPARISON:  Chest radiograph 10/22/2017 FINDINGS: Mild cardiomegaly, unchanged. No focal airspace consolidation. No pneumothorax or sizable pleural effusion. No pulmonary edema. Mild aortic arch  calcific atherosclerosis. IMPRESSION: Unchanged cardiomegaly and aortic atherosclerosis (ICD10-I70.0) without focal airspace disease. Electronically Signed   By: Ulyses Jarred M.D.   On: 11/30/2017 16:44    Procedures .Critical Care Performed by: Sherwood Gambler, MD Authorized by: Sherwood Gambler, MD   Critical care provider statement:    Critical care time (minutes):  40   Critical care was necessary to treat or prevent imminent or life-threatening deterioration of the following conditions:  Renal failure and shock   Critical care was time spent personally by me on the following activities:  Development of treatment plan with patient or surrogate, discussions with consultants, evaluation of patient's response to treatment, examination of patient, obtaining history from patient or surrogate, ordering and performing treatments and interventions, ordering and review of laboratory studies, ordering and review of radiographic studies, pulse oximetry, re-evaluation of patient's condition and review of old charts   (including critical care time)  Medications Ordered in ED Medications  sodium chloride 0.9 % bolus 1,000 mL (not administered)    And  sodium chloride 0.9 % bolus 1,000 mL (not administered)    And  sodium chloride 0.9 % bolus 1,000 mL (not administered)    And  sodium chloride 0.9 % bolus 500 mL (not administered)  piperacillin-tazobactam (ZOSYN) IVPB 3.375 g (not administered)     Initial Impression / Assessment and Plan / ED Course  I have reviewed the triage vital signs and the nursing notes.  Pertinent labs & imaging results that were available during my care of the patient were reviewed by  me and considered in my medical decision making (see chart for details).  Clinical Course as of Dec 01 1  Mon Nov 30, 2017  1617 Given low BP, abdominal pain, will activate Code Sepsis.  [SG]  8891 Bedside U/S shows very large bladder, likely cause of firmness to abdomen. Will  place foley after initial resuscitation  [SG]  1657 Patient's blood pressure is now normal, over 694 systolic after Foley catheter placement and over 750 cc of urine removed.  Thus I think the urinary retention was a greater cause for the hypotension than sepsis, especially with the lactate being normal.  Will hold further fluids given her history of CHF (she has already received ~2000 mL)  [SG]    Clinical Course User Index [SG] Sherwood Gambler, MD    Bedside ultrasound by me shows very large bladder.  Her abdominal pain and discomfort resolved after Foley catheterization.  She has had over 2000 out of her Foley catheter.  Her blood pressure immediately improved and so a code sepsis was canceled as I do not think this is infectious.  I think this is all from retention.  She has significant renal failure but no significant I disturbance that would warrant immediate intervention.  However she does have a supratherapeutic INR, likely related to the new renal failure being on warfarin.  She will be given oral vitamin K as she has no signs or symptoms of acute bleeding.  Given blood pressure is better, she will be admitted to the hospitalist service for treatment and care.  I did talk to nephrology, Dr. Marval Regal, who will see in AM.  Final Clinical Impressions(s) / ED Diagnoses   Final diagnoses:  Acute urinary retention  Acute kidney injury ([place])  Supratherapeutic INR    ED Discharge Orders    None       Sherwood Gambler, MD 12/01/17 0006

## 2017-12-01 ENCOUNTER — Inpatient Hospital Stay (HOSPITAL_COMMUNITY): Payer: BLUE CROSS/BLUE SHIELD

## 2017-12-01 ENCOUNTER — Ambulatory Visit: Payer: BLUE CROSS/BLUE SHIELD | Admitting: Cardiology

## 2017-12-01 ENCOUNTER — Other Ambulatory Visit: Payer: Self-pay

## 2017-12-01 ENCOUNTER — Encounter (HOSPITAL_COMMUNITY): Payer: Self-pay | Admitting: General Practice

## 2017-12-01 ENCOUNTER — Telehealth: Payer: Self-pay | Admitting: *Deleted

## 2017-12-01 DIAGNOSIS — K625 Hemorrhage of anus and rectum: Secondary | ICD-10-CM

## 2017-12-01 LAB — CBC
HCT: 25.9 % — ABNORMAL LOW (ref 36.0–46.0)
HEMATOCRIT: 26.3 % — AB (ref 36.0–46.0)
HEMOGLOBIN: 8.5 g/dL — AB (ref 12.0–15.0)
Hemoglobin: 8.7 g/dL — ABNORMAL LOW (ref 12.0–15.0)
MCH: 30.4 pg (ref 26.0–34.0)
MCH: 29.2 pg (ref 26.0–34.0)
MCHC: 33.6 g/dL (ref 30.0–36.0)
MCHC: 32.3 g/dL (ref 30.0–36.0)
MCV: 90.6 fL (ref 78.0–100.0)
MCV: 90.4 fL (ref 78.0–100.0)
PLATELETS: 145 10*3/uL — AB (ref 150–400)
Platelets: 158 10*3/uL (ref 150–400)
RBC: 2.86 MIL/uL — AB (ref 3.87–5.11)
RBC: 2.91 MIL/uL — ABNORMAL LOW (ref 3.87–5.11)
RDW: 14.6 % (ref 11.5–15.5)
RDW: 14.5 % (ref 11.5–15.5)
WBC: 6.9 10*3/uL (ref 4.0–10.5)
WBC: 6.7 10*3/uL (ref 4.0–10.5)

## 2017-12-01 LAB — IRON AND TIBC
IRON: 151 ug/dL (ref 28–170)
Saturation Ratios: 67 % — ABNORMAL HIGH (ref 10.4–31.8)
TIBC: 227 ug/dL — ABNORMAL LOW (ref 250–450)
UIBC: 76 ug/dL

## 2017-12-01 LAB — HEMOGLOBIN AND HEMATOCRIT, BLOOD
HCT: 23.7 % — ABNORMAL LOW (ref 36.0–46.0)
HEMOGLOBIN: 7.7 g/dL — AB (ref 12.0–15.0)

## 2017-12-01 LAB — PROTIME-INR
INR: >10
INR: 2.11
Prothrombin Time: 23.5 seconds — ABNORMAL HIGH (ref 11.4–15.2)

## 2017-12-01 LAB — URINE CULTURE: Culture: NO GROWTH

## 2017-12-01 LAB — BASIC METABOLIC PANEL
ANION GAP: 17 — AB (ref 5–15)
BUN: 160 mg/dL — ABNORMAL HIGH (ref 6–20)
CALCIUM: 7.7 mg/dL — AB (ref 8.9–10.3)
CO2: 10 mmol/L — AB (ref 22–32)
Chloride: 111 mmol/L (ref 101–111)
Creatinine, Ser: 10.57 mg/dL — ABNORMAL HIGH (ref 0.44–1.00)
GFR calc non Af Amer: 3 mL/min — ABNORMAL LOW (ref >60)
GFR, EST AFRICAN AMERICAN: 4 mL/min — AB (ref >60)
Glucose, Bld: 92 mg/dL (ref 65–99)
POTASSIUM: 4.1 mmol/L (ref 3.5–5.1)
Sodium: 138 mmol/L (ref 135–145)

## 2017-12-01 LAB — C DIFFICILE QUICK SCREEN W PCR REFLEX
C DIFFICLE (CDIFF) ANTIGEN: NEGATIVE
C Diff interpretation: NOT DETECTED
C Diff toxin: NEGATIVE

## 2017-12-01 LAB — MRSA PCR SCREENING: MRSA BY PCR: NEGATIVE

## 2017-12-01 LAB — FERRITIN: Ferritin: 325 ng/mL — ABNORMAL HIGH (ref 11–307)

## 2017-12-01 MED ORDER — AMIODARONE HCL 200 MG PO TABS
200.0000 mg | ORAL_TABLET | Freq: Every day | ORAL | Status: DC
  Administered 2017-12-01 – 2017-12-08 (×8): 200 mg via ORAL
  Filled 2017-12-01 (×8): qty 1

## 2017-12-01 MED ORDER — ONDANSETRON HCL 4 MG PO TABS
4.0000 mg | ORAL_TABLET | Freq: Four times a day (QID) | ORAL | Status: DC | PRN
  Filled 2017-12-01: qty 1

## 2017-12-01 MED ORDER — PANTOPRAZOLE SODIUM 40 MG IV SOLR
40.0000 mg | Freq: Two times a day (BID) | INTRAVENOUS | Status: DC
  Administered 2017-12-01: 40 mg via INTRAVENOUS
  Filled 2017-12-01: qty 40

## 2017-12-01 MED ORDER — ONDANSETRON HCL 4 MG/2ML IJ SOLN: 4.0000 mg | Freq: Four times a day (QID) | INTRAMUSCULAR | Status: DC | PRN

## 2017-12-01 MED ORDER — VITAMIN K1 10 MG/ML IJ SOLN
5.0000 mg | Freq: Once | INTRAMUSCULAR | Status: AC
  Administered 2017-12-01: 5 mg via INTRAVENOUS
  Filled 2017-12-01: qty 0.5

## 2017-12-01 MED ORDER — SODIUM CHLORIDE 0.9 % IV SOLN
INTRAVENOUS | Status: DC
  Administered 2017-12-01: 02:00:00 via INTRAVENOUS

## 2017-12-01 MED ORDER — SODIUM BICARBONATE 8.4 % IV SOLN
INTRAVENOUS | Status: DC
  Administered 2017-12-01 – 2017-12-02 (×3): via INTRAVENOUS
  Filled 2017-12-01 (×6): qty 150

## 2017-12-01 MED ORDER — ROSUVASTATIN CALCIUM 10 MG PO TABS
10.0000 mg | ORAL_TABLET | Freq: Every day | ORAL | Status: DC
  Administered 2017-12-01 – 2017-12-08 (×8): 10 mg via ORAL
  Filled 2017-12-01 (×9): qty 1

## 2017-12-01 MED ORDER — RANOLAZINE ER 500 MG PO TB12
500.0000 mg | ORAL_TABLET | Freq: Two times a day (BID) | ORAL | Status: DC
  Administered 2017-12-01: 500 mg via ORAL
  Filled 2017-12-01 (×2): qty 1

## 2017-12-01 NOTE — Progress Notes (Signed)
[facility] TEAM 1 - Stepdown/ICU TEAM  Patricia Wagner  ZJI:[id number] DOB: [birth year] DOA: 11/30/2017 PCP: Biagio Borg, MD    Brief Narrative:  67 y.o. female with a history of afib on coumadin, CHF, and CAD who presented with over a week of worsening inability to urinate.  She reported dribbling only, with the inability to fully empty her bladder, associated w/ suprapubic pain.  In the ED a foley returned over 2 liters of urine.  She was also noted to have AKI with creatinine over 14.   Significant Events: 1/7 admit - foley placed   Subjective: The patient is resting on the hospital stretcher.  She denies current chest pain or shortness of breath.  She reports that her abdomen feels better since her Foley catheter has been placed.  She does report some mild nausea as well as lethargy.  Assessment & Plan:  Severe acute renal failure on CKD Stage 3 No hydronephrosis on [place] suprisingly - Nephrology following   Obstructive uropathy - BOO Etiology not clear - CT abdom/pelvis to investigate further   Chronic Atrial fib on coumadin tx Multiple failed DCCVs - scheduled for ablation w/ Dr. Curt Bears this month - rate controlled at present   Chronic severe non-ishcemic systolic CHF w/ RV failure  EF 15% via TTE May 2018 - followed in CHF clinic - improved to 30-35% on TTE Aug 2018 - follow Is/Os and weights Filed Weights   11/30/17 1631  Weight: 113.4 kg (250 lb)     BRBPR  Had a number of large dark red bowel movements in the ED early this AM - has hx of diverticulosis - likely related to tics + coagulopathy - follow Hgb in serial fashion - if remains below 8 on next check will consider transfusion - correct coagulopathy   Coumadin induced coagulopathy Given ongoing GIB was tx w/ IV vitamin K - hope to simply lower INR to therapeutic range and not reverse entirely, but if drops below 2.0 will consider heparin gtt as long as hgb ok  HTN BP marginal at present - follow  HLD  DVT  prophylaxis: warfarin  Code Status: FULL CODE Family Communication: no family present at time of exam  Disposition Plan: SDU  Consultants:  Nephrology   Antimicrobials:  none  Objective: Blood pressure (!) 108/54, pulse 68, temperature 97.8 F (36.6 C), temperature source Oral, resp. rate 15, height 5\' 5"  (1.651 m), weight 113.4 kg (250 lb), SpO2 96 %.  Intake/Output Summary (Last 24 hours) at 12/01/2017 1437 Last data filed at 12/01/2017 1211 Gross per 24 hour  Intake 3800 ml  Output 3025 ml  Net 775 ml   Filed Weights   11/30/17 1631  Weight: 113.4 kg (250 lb)    Examination: General: No acute respiratory distress Lungs: Clear to auscultation bilaterally without wheezes or crackles Cardiovascular: Regular rate and rhythm without murmur  Abdomen: Mildly tender in suprapubic region, nondistended, soft, bowel sounds positive, no rebound, no ascites, no appreciable mass Extremities: No significant cyanosis, clubbing, or edema bilateral lower extremities  CBC: Recent Labs  Lab 11/30/17 1632 11/30/17 1645 12/01/17 0230 12/01/17 0621  WBC 6.5  --  6.9  --   NEUTROABS 5.6  --   --   --   HGB 8.8* 8.5* 8.7* 7.7*  HCT 26.7* 25.0* 25.9* 23.7*  MCV 90.8  --  90.6  --   PLT 163  --  145*  --    Basic Metabolic Panel: Recent Labs  Lab 11/30/17 1632 11/30/17 1645 12/01/17 0230  NA 134* 137 138  K 4.3 4.3 4.1  CL 108 109 111  CO2 14*  --  10*  GLUCOSE 113* 107* 92  BUN 164* >140* 160*  CREATININE 13.10* 14.00* 10.57*  CALCIUM 7.9*  --  7.7*   GFR: Estimated Creatinine Clearance: 6.8 mL/min (A) (by C-G formula based on SCr of 10.57 mg/dL (H)).  Liver Function Tests: Recent Labs  Lab 11/30/17 1632  AST 35  ALT 34  ALKPHOS 40  BILITOT 0.8  PROT 6.0*  ALBUMIN 3.2*    Coagulation Profile: Recent Labs  Lab 11/30/17 1632 12/01/17 0230  INR >10.00* >10.00*    HbA1C: Hgb A1c MFr Bld  Date/Time Value Ref Range Status  11/21/2016 05:41 AM 6.3 (H) 4.8 - 5.6  % Final    Comment:    (NOTE)         Pre-diabetes: 5.7 - 6.4         Diabetes: >6.4         Glycemic control for adults with diabetes: <7.0   10/20/2016 09:22 PM 5.8 (H) 4.8 - 5.6 % Final    Comment:    (NOTE)         Pre-diabetes: 5.7 - 6.4         Diabetes: >6.4         Glycemic control for adults with diabetes: <7.0      Recent Results (from the past 240 hour(s))  Blood Culture (routine x 2)     Status: None (Preliminary result)   Collection Time: 11/30/17  4:35 PM  Result Value Ref Range Status   Specimen Description BLOOD LEFT ANTECUBITAL  Final   Special Requests   Final    BOTTLES DRAWN AEROBIC AND ANAEROBIC Blood Culture results may not be optimal due to an excessive volume of blood received in culture bottles   Culture NO GROWTH < 24 HOURS  Final   Report Status PENDING  Incomplete  Blood Culture (routine x 2)     Status: None (Preliminary result)   Collection Time: 11/30/17  5:22 PM  Result Value Ref Range Status   Specimen Description BLOOD BLOOD LEFT WRIST  Final   Special Requests   Final    Blood Culture adequate volume BOTTLES DRAWN AEROBIC AND ANAEROBIC   Culture NO GROWTH < 24 HOURS  Final   Report Status PENDING  Incomplete  C difficile quick scan w PCR reflex     Status: None   Collection Time: 11/30/17  8:50 PM  Result Value Ref Range Status   C Diff antigen NEGATIVE NEGATIVE Final   C Diff toxin NEGATIVE NEGATIVE Final   C Diff interpretation No C. difficile detected.  Final     Scheduled Meds: . amiodarone  200 mg Oral Daily  . pantoprazole (PROTONIX) IV  40 mg Intravenous Q12H  . rosuvastatin  10 mg Oral Daily   Continuous Infusions: .  sodium bicarbonate  infusion 1000 mL 125 mL/hr at 12/01/17 1159     LOS: 1 day   Cherene Altes, MD [facility] Office  [phone] Pager - Text Page per Shea Evans as per below:  On-Call/Text Page:      [email or web address]      password [email or web address]  If 7PM-7AM, please contact  night-coverage [email or web address] Password [facility] 12/01/2017, 2:37 PM

## 2017-12-01 NOTE — Consult Note (Signed)
Referring Provider: No ref. provider found Primary Care Physician:  Biagio Borg, MD Primary Nephrologist:   Reason for Consultation:     HPI:67 y.o. female with medical history significant of afib on coumadin, CHF, CAD comes in with over a week of worsening ability to urinate.  No fevers.  She dribbles and cannot fully empty her bladder.  She has been having suprapubic pain and feels discomfort there that became more generalized today.  No n/v.  She is having many loose stools also present in the ED.  In the ED had a bladder scan which showed large amount of urine in bladder, foley was placed and returned over 2 liters of uop.  Pt symptoms have resolved since foley placed, pain is gone however she is still having loose stools.    Urine retention appears to have been the etiology of this episode of acute renal failure it is interesting that there was a similar episode of acute renal failure  In June last year 2018  This was thought to be due to hypotension and ARB use and resolved  She may need a CT abdomen and pelvis to evaluate mass    Past Medical History:  Diagnosis Date  . Arthritis   . Atrial fibrillation ([place])    a. s/p multiple cardioversions; failed tikosyn/sotalol.  Marland Kitchen CAD in native artery, s/p cardiac cath with non obstructive CAD 10/24/2016  . CHF (congestive heart failure) ([place])   . DIVERTICULITIS, HX OF 07/25/2007  . DIVERTICULOSIS, COLON 07/22/2007  . Dyspnea   . Edema, peripheral    a. chronic BLE edema, R>L. Prior trauma from dog attack and accident.  Marland Kitchen HYPERLIPIDEMIA 02/03/2008  . Hypersomnia    declines w/u  . Hypertension   . MENOPAUSAL DISORDER 01/09/2011  . Morbid obesity ([place]) 07/22/2007  . NICM (nonischemic cardiomyopathy) ([place]) 10/24/2016  . Raynaud's syndrome 07/22/2007  . Stroke ([place]) 2017  . THYROID NODULE, RIGHT 01/04/2010  . VITAMIN D DEFICIENCY 01/09/2011   Qualifier: Diagnosis of  By: [name], [name]     Past Surgical History:  Procedure Laterality Date   . CARDIAC CATHETERIZATION N/A 10/23/2016   Procedure: Left Heart Cath and Coronary Angiography;  Surgeon: [name], MD;  Location: [facility];  Service: Cardiovascular;  Laterality: N/A;  . CARDIOVERSION N/A 10/31/2016   Procedure: CARDIOVERSION;  Surgeon: [name], MD;  Location: [place];  Service: Cardiovascular;  Laterality: N/A;  . CARDIOVERSION N/A 11/03/2016   Procedure: CARDIOVERSION;  Surgeon: [name], MD;  Location: [place];  Service: Cardiovascular;  Laterality: N/A;  . CARDIOVERSION N/A 11/18/2016   Procedure: CARDIOVERSION;  Surgeon: [name], MD;  Location: [facility] ENDOSCOPY;  Service: Cardiovascular;  Laterality: N/A;  . CARDIOVERSION N/A 03/25/2017   Procedure: CARDIOVERSION;  Surgeon: [name], MD;  Location: [facility] ENDOSCOPY;  Service: Cardiovascular;  Laterality: N/A;  . CARDIOVERSION N/A 05/06/2017   Procedure: CARDIOVERSION;  Surgeon: [name], MD;  Location: [facility] ENDOSCOPY;  Service: Cardiovascular;  Laterality: N/A;  . CARDIOVERSION N/A 05/12/2017   Procedure: CARDIOVERSION;  Surgeon: [name], MD;  Location: [facility] ENDOSCOPY;  Service: Cardiovascular;  Laterality: N/A;  . CHOLECYSTECTOMY    . COLONOSCOPY    . PARTIAL HYSTERECTOMY     1 OVARY LEFT  . POLYPECTOMY    . TEE WITHOUT CARDIOVERSION N/A 10/31/2016   Procedure: TRANSESOPHAGEAL ECHOCARDIOGRAM (TEE);  Surgeon: [name], MD;  Location: [place];  Service: Cardiovascular;  Laterality: N/A;  .  TEE WITHOUT CARDIOVERSION N/A 03/25/2017   Procedure: TRANSESOPHAGEAL ECHOCARDIOGRAM (TEE);  Surgeon: [name], MD;  Location: [facility] ENDOSCOPY;  Service: Cardiovascular;  Laterality: N/A;    Prior to Admission medications   Medication Sig Start Date End Date Taking? Authorizing Provider  acetaminophen (TYLENOL) 325 MG tablet Take 650 mg by mouth every 6 (six) hours as needed (pain).    Yes [provider]  amiodarone (PACERONE) 200 MG tablet Take 1  tablet (200 mg total) by mouth daily. 05/29/17  Yes Shirley Friar, PA-C  carvedilol (COREG) 3.125 MG tablet Take 1 tablet (3.125 mg total) by mouth 2 (two) times daily. 07/09/17 11/30/17 Yes [name], MD  diclofenac sodium (VOLTAREN) 1 % GEL Apply 4 g topically 4 (four) times daily as needed (As directed for skin). Patient taking differently: Apply 4 g topically 4 (four) times daily as needed (for pain).  10/23/17  Yes Strader, [place], PA-C  furosemide (LASIX) 40 MG tablet Take 20 mg by mouth daily.   Yes [provider]  Investigational - Study Medication Take 1 tablet by mouth 2 (two) times daily. Study name: Galactic HF Study  Additional study details: Omecamtiv Mecarbil or Placebo 09/17/17  Yes Larey Dresser, MD  isosorbide-hydrALAZINE (BIDIL) 20-37.5 MG tablet Take 1 tablet by mouth 3 (three) times daily. Patient taking differently: Take 1 tablet by mouth 2 (two) times daily.  10/20/17  Yes [name], MD  ranolazine (RANEXA) 500 MG 12 hr tablet Take 1 tablet (500 mg total) by mouth 2 (two) times daily. 09/14/17  Yes Bensimhon, Shaune Pascal, MD  rosuvastatin (CRESTOR) 10 MG tablet TAKE 1 TABLET (10 MG TOTAL) BY MOUTH DAILY. 10/07/17  Yes Biagio Borg, MD  sacubitril-valsartan (ENTRESTO) 49-51 MG Take 1 tablet by mouth 2 (two) times daily. 08/19/17  Yes Bensimhon, Shaune Pascal, MD  spironolactone (ALDACTONE) 25 MG tablet Take 0.5 tablets (12.5 mg total) by mouth daily. 10/23/17  Yes Strader, Fransisco Hertz, PA-C  tamsulosin (FLOMAX) 0.4 MG CAPS capsule Take 1 capsule (0.4 mg total) by mouth daily. 11/23/17  Yes Biagio Borg, MD  warfarin (COUMADIN) 3 MG tablet TAKE 1 TABLET BY MOUTH DAILY OR AS DIRECTED BY COUMADIN CLINIC Patient taking differently: Take 3 mg by mouth at bedtime on Sun/Mon/Tues/Thurs/Fri/Sat and 4.5 mg on Wed 08/10/17  Yes [name], Denice Bors, MD  furosemide (LASIX) 20 MG tablet Take 1 tablet (20 mg total) by mouth daily. Patient not taking: Reported on  11/30/2017 10/27/17   Erma Heritage, PA-C    Current Facility-Administered Medications  Medication Dose Route Frequency Provider Last Rate Last Dose  . amiodarone (PACERONE) tablet 200 mg  200 mg Oral Daily Derrill Kay A, MD   200 mg at 12/01/17 1004  . ondansetron (ZOFRAN) tablet 4 mg  4 mg Oral Q6H PRN Phillips Grout, MD       Or  . ondansetron (ZOFRAN) injection 4 mg  4 mg Intravenous Q6H PRN Derrill Kay A, MD      . pantoprazole (PROTONIX) injection 40 mg  40 mg Intravenous Q12H Gardiner Barefoot, NP   40 mg at 12/01/17 0626  . ranolazine (RANEXA) 12 hr tablet 500 mg  500 mg Oral BID Derrill Kay A, MD   500 mg at 12/01/17 1004  . rosuvastatin (CRESTOR) tablet 10 mg  10 mg Oral Daily David, Rachal A, MD      . sodium bicarbonate 150 mEq in dextrose 5 % 1,000 mL infusion  Intravenous Continuous Edrick Oh, MD       Current Outpatient Medications  Medication Sig Dispense Refill  . acetaminophen (TYLENOL) 325 MG tablet Take 650 mg by mouth every 6 (six) hours as needed (pain).     Marland Kitchen amiodarone (PACERONE) 200 MG tablet Take 1 tablet (200 mg total) by mouth daily. 30 tablet 6  . carvedilol (COREG) 3.125 MG tablet Take 1 tablet (3.125 mg total) by mouth 2 (two) times daily. 180 tablet 3  . diclofenac sodium (VOLTAREN) 1 % GEL Apply 4 g topically 4 (four) times daily as needed (As directed for skin). (Patient taking differently: Apply 4 g topically 4 (four) times daily as needed (for pain). ) 100 g 0  . furosemide (LASIX) 40 MG tablet Take 20 mg by mouth daily.    . Investigational - Study Medication Take 1 tablet by mouth 2 (two) times daily. Study name: Galactic HF Study  Additional study details: Omecamtiv Mecarbil or Placebo 1 each PRN  . isosorbide-hydrALAZINE (BIDIL) 20-37.5 MG tablet Take 1 tablet by mouth 3 (three) times daily. (Patient taking differently: Take 1 tablet by mouth 2 (two) times daily. ) 180 tablet 11  . ranolazine (RANEXA) 500 MG 12 hr tablet Take 1 tablet  (500 mg total) by mouth 2 (two) times daily. 60 tablet 5  . rosuvastatin (CRESTOR) 10 MG tablet TAKE 1 TABLET (10 MG TOTAL) BY MOUTH DAILY. 90 tablet 2  . sacubitril-valsartan (ENTRESTO) 49-51 MG Take 1 tablet by mouth 2 (two) times daily. 60 tablet 3  . spironolactone (ALDACTONE) 25 MG tablet Take 0.5 tablets (12.5 mg total) by mouth daily. 45 tablet 3  . tamsulosin (FLOMAX) 0.4 MG CAPS capsule Take 1 capsule (0.4 mg total) by mouth daily. 30 capsule 1  . warfarin (COUMADIN) 3 MG tablet TAKE 1 TABLET BY MOUTH DAILY OR AS DIRECTED BY COUMADIN CLINIC (Patient taking differently: Take 3 mg by mouth at bedtime on Sun/Mon/Tues/Thurs/Fri/Sat and 4.5 mg on Wed) 30 tablet 3  . furosemide (LASIX) 20 MG tablet Take 1 tablet (20 mg total) by mouth daily. (Patient not taking: Reported on 11/30/2017)      Allergies as of 11/30/2017 - Review Complete 11/30/2017  Allergen Reaction Noted  . Ace inhibitors Palpitations 03/20/2011    Family History  Problem Relation Age of Onset  . Asthma Mother   . Diabetes Father   . Heart disease Father        Died of presumed heart attack - 87s  . Lung disease Sister   . Heart disease Sister        Twin sister has heart issue, unclear what kind  . Thyroid disease Neg Hx     Social History   Socioeconomic History  . Marital status: Single    Spouse name: Not on file  . Number of children: 1  . Years of education: Not on file  . Highest education level: Not on file  Social Needs  . Financial resource strain: Not on file  . Food insecurity - worry: Not on file  . Food insecurity - inability: Not on file  . Transportation needs - medical: Not on file  . Transportation needs - non-medical: Not on file  Occupational History  . Occupation: Academic librarian   Tobacco Use  . Smoking status: Current Every Day Smoker    Packs/day: 0.50    Types: Cigarettes    Last attempt to quit: 10/23/2016    Years since quitting: 1.1  . Smokeless tobacco: Never Used  .  Tobacco  comment: Smoked for 50 years  Substance and Sexual Activity  . Alcohol use: Yes    Alcohol/week: 2.4 oz    Types: 4 Cans of beer per week    Comment: Intermittent  . Drug use: No  . Sexual activity: Not on file  Other Topics Concern  . Not on file  Social History Narrative  . Not on file    Review of Systems: Gen: Denies any fever, chills, sweats, anorexia, fatigue, weakness, malaise, weight loss, and sleep disorder HEENT: No visual complaints, No history of Retinopathy. Normal external appearance No Epistaxis or Sore throat. No sinusitis.   CV: Denies chest pain, angina, palpitations, syncope, orthopnea, PND, peripheral edema, and claudication. Resp: Denies dyspnea at rest, dyspnea with exercise, cough, sputum, wheezing, coughing up blood, and pleurisy. GI: Denies vomiting blood, jaundice, and fecal incontinence.   Denies dysphagia or odynophagia. GU : Denies urinary burning, blood in urine, urinary frequency, urinary hesitancy, nocturnal urination, and urinary incontinence.  No renal calculi. MS: Denies joint pain, limitation of movement, and swelling, stiffness, low back pain, extremity pain. Denies muscle weakness, cramps, atrophy.  No use of non steroidal antiinflammatory drugs. Derm: Denies rash, itching, dry skin, hives, moles, warts, or unhealing ulcers.  Psych: Denies depression, anxiety, memory loss, suicidal ideation, hallucinations, paranoia, and confusion. Heme: Denies bruising, bleeding, and enlarged lymph nodes. Neuro: No headache.  No diplopia. No dysarthria.  No dysphasia.  No history of CVA.  No Seizures. No paresthesias.  No weakness. Endocrine No DM.  No Thyroid disease.  No Adrenal disease.  Physical Exam: Vital signs in last 24 hours: Temp:  [97.8 F (36.6 C)] 97.8 F (36.6 C) (01/07 1556) Pulse Rate:  [58-67] 65 (01/08 0930) Resp:  [13-23] 18 (01/08 0930) BP: (64-125)/(37-68) 108/52 (01/08 0930) SpO2:  [94 %-100 %] 97 % (01/08 0930) Weight:  [247 lb 8 oz  (112.3 kg)-250 lb (113.4 kg)] 250 lb (113.4 kg) (01/07 1631)   General:   Alert,  Well-developed, well-nourished, pleasant and cooperative in NAD Head:  Normocephalic and atraumatic. Eyes:  Sclera clear, no icterus.   Conjunctiva pink. Ears:  Normal auditory acuity. Nose:  No deformity, discharge,  or lesions. Mouth:  No deformity or lesions, dentition normal. Neck:  Supple; no masses or thyromegaly. JVP not elevated Lungs:  Clear throughout to auscultation.   No wheezes, crackles, or rhonchi. No acute distress. Heart:  Regular rate and rhythm; no murmurs, clicks, rubs,  or gallops. Abdomen:  Soft, nontender and nondistended. No masses, hepatosplenomegaly or hernias noted. Normal bowel sounds, without guarding, and without rebound.  Foley with urine Msk:  Symmetrical without gross deformities. Normal posture. Pulses:  No carotid, renal, femoral bruits. DP and PT symmetrical and equal Extremities:  Without clubbing or edema. Neurologic:  Alert and  oriented x4;  grossly normal neurologically. Skin:  Intact without significant lesions or rashes. Cervical Nodes:  No significant cervical adenopathy. Psych:  Alert and cooperative. Normal mood and affect.  Intake/Output from previous day: 01/07 0701 - 01/08 0700 In: 3250 [IV Piggyback:3250] Out: 2200 [Urine:2200] Intake/Output this shift: Total I/O In: 50 [IV Piggyback:50] Out: -   Lab Results: Recent Labs    11/30/17 1632 11/30/17 1645 12/01/17 0230 12/01/17 0621  WBC 6.5  --  6.9  --   HGB 8.8* 8.5* 8.7* 7.7*  HCT 26.7* 25.0* 25.9* 23.7*  PLT 163  --  145*  --    BMET Recent Labs    11/30/17 1632 11/30/17 1645 12/01/17  0230  NA 134* 137 138  K 4.3 4.3 4.1  CL 108 109 111  CO2 14*  --  10*  GLUCOSE 113* 107* 92  BUN 164* >140* 160*  CREATININE 13.10* 14.00* 10.57*  CALCIUM 7.9*  --  7.7*   LFT Recent Labs    11/30/17 1632  PROT 6.0*  ALBUMIN 3.2*  AST 35  ALT 34  ALKPHOS 40  BILITOT 0.8   PT/INR Recent  Labs    11/30/17 1632 12/01/17 0230  LABPROT >90.0* >90.0*  INR >10.00* >10.00*   Hepatitis Panel No results for input(s): HEPBSAG, HCVAB, HEPAIGM, HEPBIGM in the last 72 hours.  Studies/Results: US Renal  Result Date: 12/01/2017 CLINICAL DATA:  Acute renal injury EXAM: RENAL / URINARY TRACT ULTRASOUND COMPLETE COMPARISON:  04/29/2017 FINDINGS: Limited by patient body habitus and overlying bowel. Right Kidney: Length: 9.9 cm. Echogenicity within normal limits. No mass or hydronephrosis visualized. Left Kidney: Length: 10.4 cm. Echogenicity within normal limits. No nephrolithiasis nor hydronephrosis. Slight exophytic anechoic interpolar renal cyst measuring 2.6 x 2.6 x 2.7 cm. Bladder: Decompressed by Foley catheter. IMPRESSION: Interpolar left renal cyst measuring 2.6 x 2.6 x 2.7 cm. Through transmission is noted. No suspicious septations or nodular foci noted within. Maintained cortical-medullary distinction of the kidneys. Electronically Signed   By: Ashley Royalty M.D.   On: 12/01/2017 03:22   Dg Chest Port 1 View  Result Date: 11/30/2017 CLINICAL DATA:  Sepsis and abdominal pain EXAM: PORTABLE CHEST 1 VIEW COMPARISON:  Chest radiograph 10/22/2017 FINDINGS: Mild cardiomegaly, unchanged. No focal airspace consolidation. No pneumothorax or sizable pleural effusion. No pulmonary edema. Mild aortic arch calcific atherosclerosis. IMPRESSION: Unchanged cardiomegaly and aortic atherosclerosis (ICD10-I70.0) without focal airspace disease. Electronically Signed   By: Ulyses Jarred M.D.   On: 11/30/2017 16:44    Assessment/Plan:  Acute kidney injury with some obstructive/  Retention pathology would continue foley and evaluate with plelvic CT  Anemia check Iron studies   Metabolic acidosis start IV bicarbonate 125cc /hour   HTN  Blood pressure soft would not give antihypertensives     LOS: 1 Patricia Wagner W @TODAY @10 :58 AM

## 2017-12-01 NOTE — ED Notes (Signed)
Pt had a bloody bowel movement. Was cleaned and changed.

## 2017-12-01 NOTE — ED Notes (Signed)
Admitting team rounding at bedside 

## 2017-12-01 NOTE — Telephone Encounter (Signed)
Informed patient that I cancelled for appt today, cancelled CT for 1/11 and cancelled ablation for next week secondary to current health issue/s. Pt understands I will call her to reschedule at a later time.

## 2017-12-01 NOTE — ED Notes (Signed)
Pt had a 3rd liquid bloody stool - NP Baltazar Najjar paged again

## 2017-12-01 NOTE — ED Notes (Signed)
Pt had a large, watery, dark red bowel movement. Pt denies abd pain or dizziness. Admitting MD paged. Pt also moved to a hospital bed for comfort

## 2017-12-01 NOTE — ED Notes (Signed)
Attempted to call report x 1  

## 2017-12-02 ENCOUNTER — Inpatient Hospital Stay (HOSPITAL_COMMUNITY): Payer: BLUE CROSS/BLUE SHIELD

## 2017-12-02 DIAGNOSIS — N179 Acute kidney failure, unspecified: Principal | ICD-10-CM

## 2017-12-02 DIAGNOSIS — I482 Chronic atrial fibrillation: Secondary | ICD-10-CM

## 2017-12-02 DIAGNOSIS — N139 Obstructive and reflux uropathy, unspecified: Secondary | ICD-10-CM

## 2017-12-02 DIAGNOSIS — I10 Essential (primary) hypertension: Secondary | ICD-10-CM

## 2017-12-02 DIAGNOSIS — I428 Other cardiomyopathies: Secondary | ICD-10-CM

## 2017-12-02 LAB — PROTIME-INR
INR: 1.55
Prothrombin Time: 18.5 seconds — ABNORMAL HIGH (ref 11.4–15.2)

## 2017-12-02 LAB — RENAL FUNCTION PANEL
ALBUMIN: 2.6 g/dL — AB (ref 3.5–5.0)
ANION GAP: 11 (ref 5–15)
BUN: 109 mg/dL — AB (ref 6–20)
CALCIUM: 7.6 mg/dL — AB (ref 8.9–10.3)
CO2: 22 mmol/L (ref 22–32)
Chloride: 111 mmol/L (ref 101–111)
Creatinine, Ser: 4.72 mg/dL — ABNORMAL HIGH (ref 0.44–1.00)
GFR calc Af Amer: 10 mL/min — ABNORMAL LOW (ref >60)
GFR calc non Af Amer: 9 mL/min — ABNORMAL LOW (ref >60)
GLUCOSE: 101 mg/dL — AB (ref 65–99)
PHOSPHORUS: 3.8 mg/dL (ref 2.5–4.6)
Potassium: 3.3 mmol/L — ABNORMAL LOW (ref 3.5–5.1)
SODIUM: 144 mmol/L (ref 135–145)

## 2017-12-02 LAB — CBC
HCT: 22.2 % — ABNORMAL LOW (ref 36.0–46.0)
HEMOGLOBIN: 7.4 g/dL — AB (ref 12.0–15.0)
MCH: 30.2 pg (ref 26.0–34.0)
MCHC: 33.3 g/dL (ref 30.0–36.0)
MCV: 90.6 fL (ref 78.0–100.0)
PLATELETS: 126 10*3/uL — AB (ref 150–400)
RBC: 2.45 MIL/uL — ABNORMAL LOW (ref 3.87–5.11)
RDW: 14.3 % (ref 11.5–15.5)
WBC: 6.9 10*3/uL (ref 4.0–10.5)

## 2017-12-02 LAB — RETICULOCYTES
RBC.: 2.25 MIL/uL — AB (ref 3.87–5.11)
RETIC COUNT ABSOLUTE: 74.3 10*3/uL (ref 19.0–186.0)
Retic Ct Pct: 3.3 % — ABNORMAL HIGH (ref 0.4–3.1)

## 2017-12-02 LAB — IRON AND TIBC
IRON: 61 ug/dL (ref 28–170)
Saturation Ratios: 33 % — ABNORMAL HIGH (ref 10.4–31.8)
TIBC: 185 ug/dL — ABNORMAL LOW (ref 250–450)
UIBC: 124 ug/dL

## 2017-12-02 LAB — FOLATE: FOLATE: 10.5 ng/mL (ref >5.9)

## 2017-12-02 LAB — HEMOGLOBIN AND HEMATOCRIT, BLOOD
HEMATOCRIT: 23.6 % — AB (ref 36.0–46.0)
Hemoglobin: 7.6 g/dL — ABNORMAL LOW (ref 12.0–15.0)

## 2017-12-02 LAB — FERRITIN: Ferritin: 439 ng/mL — ABNORMAL HIGH (ref 11–307)

## 2017-12-02 LAB — POTASSIUM: POTASSIUM: 3.6 mmol/L (ref 3.5–5.1)

## 2017-12-02 LAB — VITAMIN B12: Vitamin B-12: 486 pg/mL (ref 180–914)

## 2017-12-02 LAB — ABO/RH: ABO/RH(D): AB POS

## 2017-12-02 LAB — MAGNESIUM: MAGNESIUM: 1.3 mg/dL — AB (ref 1.7–2.4)

## 2017-12-02 MED ORDER — MAGNESIUM SULFATE 50 % IJ SOLN
3.0000 g | Freq: Once | INTRAVENOUS | Status: AC
  Administered 2017-12-02: 3 g via INTRAVENOUS
  Filled 2017-12-02: qty 6

## 2017-12-02 MED ORDER — DEXTROSE-NACL 5-0.9 % IV SOLN
INTRAVENOUS | Status: DC
  Administered 2017-12-02: 10:00:00 via INTRAVENOUS

## 2017-12-02 MED ORDER — POTASSIUM CHLORIDE 20 MEQ/15ML (10%) PO SOLN
50.0000 meq | Freq: Once | ORAL | Status: AC
  Administered 2017-12-02: 50 meq via ORAL
  Filled 2017-12-02 (×2): qty 45

## 2017-12-02 MED ORDER — SODIUM CHLORIDE 0.9 % IV SOLN: Freq: Once | INTRAVENOUS | Status: DC

## 2017-12-02 MED ORDER — POTASSIUM CHLORIDE 20 MEQ/15ML (10%) PO SOLN: 40.0000 meq | Freq: Once | ORAL | Status: DC

## 2017-12-02 NOTE — Progress Notes (Signed)
PROGRESS NOTE    Patricia Wagner  CHY:[id number] DOB: 04-30-1951 DOA: 11/30/2017 PCP: Biagio Borg, MD   Brief Narrative:  67 y.o. BF PMHx CVA, A-Fib on coumadin, Chronic Systolic CHF/NICM,  A. Fib S/P multiple failed DCCV, CAD, HTN,HLD, Menopausal DO,  Arthritis, DIVERTICULITIS, DIVERTICULOSIS, Morbid Obesity, Raynaud's syndrome, THYROID NODULE, RIGHT  Presented with over a week of worsening inability to urinate.  She reported dribbling only, with the inability to fully empty her bladder, associated w/ suprapubic pain.   In the ED a foley returned over 2 liters of urine.  She was also noted to have AKI with creatinine over 14.     Subjective: 1/9 A/O 4, negative SOB, negative CP. Positive RLQ abdominal pain (described as pins and needles). Negative N/V. States she's had a colonoscopy long time ago does not recall anybody telling her she had diverticulitis/diverticulosis.     Assessment & Plan:   Principal Problem:   Acute renal failure superimposed on chronic kidney disease (HCC) Active Problems:   Essential hypertension   Raynaud's syndrome   CAD in native artery, s/p cardiac cath with non obstructive CAD   CHF (congestive heart failure) ([place])   Cerebrovascular accident (CVA) due to embolism of left cerebellar artery (HCC)   Atrial fibrillation (HCC)   Subtherapeutic international normalized ratio (INR)   Diarrhea   Acute renal failure on CKD Stage 3 -Renal ultrasound: Negative hydronephrosis Recent Labs  Lab 11/30/17 1632 11/30/17 1645 12/01/17 0230 12/02/17 0356  CREATININE 13.10* 14.00* 10.57* 4.72*  -Improving continue to monitor closely  -D5-0.9% saline 52ml/hr -S/P Foley placement -Nephrology following  Obstructive uropathy -Unclear etiology -CT abdomen pelvis nondiagnostic for cause see results below   Chronic Atrial fib on Coumadin/Supratherapeutic INR Recent Labs  Lab 11/30/17 1632 12/01/17 0230 12/01/17 1527 12/02/17 0356  INR >10.00* >10.00*  2.11 1.55  -IR now normalized -Multiple failed DCCV -Per EMR scheduled for another ablation with Dr. Curt Bears EP this month  Chronic systolic CHF/Nonischemic Cardiomyopathy -Strict in and out -Daily weight -Transfusion hemoglobin<8 -1/9 transfuse 1 unit PRBC  Essential HTN   GI bleed/acute blood loss anemia vs anemia chronic disease -BRBPR -1/7 fecal occult positive (INR supratherapeutic at the time) - 1/9 INR corrected repeat fecal occult blood: INR daily -Anemia panel pending -If patient continues to bleed now that INR normalize will consult GI for colonoscopy   Coumadin-induced coagulopathy Given ongoing GIB was tx w/ IV vitamin K - hope to simply lower INR to therapeutic range and not reverse entirely, but if drops below 2.0 will consider heparin gtt as long as hgb ok Recent Labs  Lab 11/30/17 1632 12/01/17 0230 12/01/17 1527 12/02/17 0356  INR >10.00* >10.00* 2.11 1.55     HLD   Hypokalemia -Potassium goal> 4 -Potassium 50 mEq -Repeat K/low: Repeat potassium 40 mEq  Hypomagnesemia Magnesium goal> 2 -Magnesium IV 3 g     DVT prophylaxis: SCD Code Status: full Family Communication: none Disposition Plan: TBD   Consultants:  Nephrology     Procedures/Significant Events:  07/15/2017 Echocardiogram:- Left ventricle: severe concentric hypertrophy.  -LVEF=  30% to 35%. Diffuse hypokinesis. - Mitral valve: mild to moderate regurgitation. -Left atrium: Severely dilated. -Right atrium: moderately dilated. - Pericardium, extracardiac: A trivial pericardial effusion was   identified.  ----------------------------------------------------------------------------------------------------------------------------------- 1/9 CT abdomen and pelvis WO contrast:No urolithiasis. Foley catheter in place with bladder wall thickening. Prominence of both renal collecting systems without frank hydronephrosis. No evidence of obstructive uropathy or bladder outlet  obstruction or obstructive  uropathy. 2. Colonic diverticulosis without diverticulitis. 3. Bilateral renal cysts. 4. Incidental findings of left adrenal adenoma right adrenal adenoma versus myelolipoma, and Aortic Atherosclerosis (ICD10-I70.0).   I have personally reviewed and interpreted all radiology studies and my findings are as above.  VENTILATOR SETTINGS:    Cultures   Antimicrobials: Anti-infectives (From admission, onward)   Start     Stop   12/01/17 0000  piperacillin-tazobactam (ZOSYN) IVPB 3.375 g  Status:  Discontinued     11/30/17 1659   11/30/17 1700  cefTRIAXone (ROCEPHIN) 1 g in dextrose 5 % 50 mL IVPB     11/30/17 1804   11/30/17 1630  piperacillin-tazobactam (ZOSYN) IVPB 3.375 g  Status:  Discontinued     11/30/17 1701       Devices    LINES / TUBES:      Continuous Infusions: . dextrose 5 % and 0.9% NaCl       Objective: Vitals:   12/01/17 1538 12/01/17 2118 12/01/17 2300 12/02/17 0614  BP: (!) 115/57 (!) 106/56 (!) 106/59 113/65  Pulse: 76 67 69 70  Resp: 20 13 15 15   Temp: 98.4 F (36.9 C) 98.4 F (36.9 C) 98.1 F (36.7 C) 98.9 F (37.2 C)  TempSrc: Axillary Oral Oral Oral  SpO2: 98% 100% 100% 100%  Weight:    247 lb 5.7 oz (112.2 kg)  Height:        Intake/Output Summary (Last 24 hours) at 12/02/2017 0846 Last data filed at 12/02/2017 0500 Gross per 24 hour  Intake 1887 ml  Output 6075 ml  Net -4188 ml   Filed Weights   11/30/17 1631 12/01/17 1456 12/02/17 0614  Weight: 250 lb (113.4 kg) 233 lb 1.6 oz (105.7 kg) 247 lb 5.7 oz (112.2 kg)    Examination:  General: A/O 4,No acute respiratory distress Neck:  Negative scars, masses, torticollis, lymphadenopathy, JVD Lungs: Clear to auscultation bilaterally without wheezes or crackles Cardiovascular: Regular rate and rhythm without murmur gallop or rub normal S1 and S2 Abdomen: positive RLQ abdominal pain, nondistended, positive soft, bowel sounds, no rebound, no ascites, no  appreciable mass Extremities: No significant cyanosis, clubbing, or edema bilateral lower extremities Skin: Negative rashes, lesions, ulcers Psychiatric:  Negative depression, negative anxiety, negative fatigue, negative mania  Central nervous system:  Cranial nerves II through XII intact, tongue/uvula midline, all extremities muscle strength 5/5, sensation intact throughout, negative dysarthria, negative expressive aphasia, negative receptive aphasia.  .     Data Reviewed: Care during the described time interval was provided by me .  I have reviewed this patient's available data, including medical history, events of note, physical examination, and all test results as part of my evaluation.   CBC: Recent Labs  Lab 11/30/17 1632 11/30/17 1645 12/01/17 0230 12/01/17 0621 12/01/17 1527 12/02/17 0356  WBC 6.5  --  6.9  --  6.7 6.9  NEUTROABS 5.6  --   --   --   --   --   HGB 8.8* 8.5* 8.7* 7.7* 8.5* 7.4*  HCT 26.7* 25.0* 25.9* 23.7* 26.3* 22.2*  MCV 90.8  --  90.6  --  90.4 90.6  PLT 163  --  145*  --  158 712*   Basic Metabolic Panel: Recent Labs  Lab 11/30/17 1632 11/30/17 1645 12/01/17 0230 12/02/17 0356  NA 134* 137 138 144  K 4.3 4.3 4.1 3.3*  CL 108 109 111 111  CO2 14*  --  10* 22  GLUCOSE 113* 107* 92 101*  BUN 164* >140* 160* 109*  CREATININE 13.10* 14.00* 10.57* 4.72*  CALCIUM 7.9*  --  7.7* 7.6*  PHOS  --   --   --  3.8   GFR: Estimated Creatinine Clearance: 15 mL/min (A) (by C-G formula based on SCr of 4.72 mg/dL (H)). Liver Function Tests: Recent Labs  Lab 11/30/17 1632 12/02/17 0356  AST 35  --   ALT 34  --   ALKPHOS 40  --   BILITOT 0.8  --   PROT 6.0*  --   ALBUMIN 3.2* 2.6*   No results for input(s): LIPASE, AMYLASE in the last 168 hours. No results for input(s): AMMONIA in the last 168 hours. Coagulation Profile: Recent Labs  Lab 11/30/17 1632 12/01/17 0230 12/01/17 1527 12/02/17 0356  INR >10.00* >10.00* 2.11 1.55   Cardiac  Enzymes: No results for input(s): CKTOTAL, CKMB, CKMBINDEX, TROPONINI in the last 168 hours. BNP (last 3 results) No results for input(s): PROBNP in the last 8760 hours. HbA1C: No results for input(s): HGBA1C in the last 72 hours. CBG: No results for input(s): GLUCAP in the last 168 hours. Lipid Profile: No results for input(s): CHOL, HDL, LDLCALC, TRIG, CHOLHDL, LDLDIRECT in the last 72 hours. Thyroid Function Tests: No results for input(s): TSH, T4TOTAL, FREET4, T3FREE, THYROIDAB in the last 72 hours. Anemia Panel: Recent Labs    12/01/17 1527  FERRITIN 325*  TIBC 227*  IRON 151   Urine analysis:    Component Value Date/Time   COLORURINE YELLOW 11/30/2017 [place] 11/30/2017 1653   LABSPEC 1.013 11/30/2017 1653   PHURINE 5.0 11/30/2017 1653   GLUCOSEU NEGATIVE 11/30/2017 1653   GLUCOSEU NEGATIVE 11/10/2017 1216   HGBUR MODERATE (A) 11/30/2017 1653   BILIRUBINUR NEGATIVE 11/30/2017 1653   KETONESUR NEGATIVE 11/30/2017 1653   PROTEINUR NEGATIVE 11/30/2017 1653   UROBILINOGEN 0.2 11/10/2017 1216   NITRITE NEGATIVE 11/30/2017 1653   LEUKOCYTESUR NEGATIVE 11/30/2017 1653   Sepsis Labs: @LABRCNTIP (procalcitonin:4,lacticidven:4)  ) Recent Results (from the past 240 hour(s))  Urine culture     Status: None   Collection Time: 11/30/17  4:17 PM  Result Value Ref Range Status   Specimen Description URINE, RANDOM  Final   Special Requests NONE  Final   Culture NO GROWTH  Final   Report Status 12/01/2017 FINAL  Final  Blood Culture (routine x 2)     Status: None (Preliminary result)   Collection Time: 11/30/17  4:35 PM  Result Value Ref Range Status   Specimen Description BLOOD LEFT ANTECUBITAL  Final   Special Requests   Final    BOTTLES DRAWN AEROBIC AND ANAEROBIC Blood Culture results may not be optimal due to an excessive volume of blood received in culture bottles   Culture NO GROWTH < 24 HOURS  Final   Report Status PENDING  Incomplete  Blood  Culture (routine x 2)     Status: None (Preliminary result)   Collection Time: 11/30/17  5:22 PM  Result Value Ref Range Status   Specimen Description BLOOD BLOOD LEFT WRIST  Final   Special Requests   Final    Blood Culture adequate volume BOTTLES DRAWN AEROBIC AND ANAEROBIC   Culture NO GROWTH < 24 HOURS  Final   Report Status PENDING  Incomplete  C difficile quick scan w PCR reflex     Status: None   Collection Time: 11/30/17  8:50 PM  Result Value Ref Range Status   C Diff antigen NEGATIVE NEGATIVE Final  C Diff toxin NEGATIVE NEGATIVE Final   C Diff interpretation No C. difficile detected.  Final  MRSA PCR Screening     Status: None   Collection Time: 12/01/17  3:19 PM  Result Value Ref Range Status   MRSA by PCR NEGATIVE NEGATIVE Final    Comment:        The GeneXpert MRSA Assay (FDA approved for NASAL specimens only), is one component of a comprehensive MRSA colonization surveillance program. It is not intended to diagnose MRSA infection nor to guide or monitor treatment for MRSA infections.          Radiology Studies: Ct Abdomen Pelvis Wo Contrast  Result Date: 12/02/2017 CLINICAL DATA:  Obstructive uropathy at level of bladder. Lower abdominal pain and hematuria. EXAM: CT ABDOMEN AND PELVIS WITHOUT CONTRAST TECHNIQUE: Multidetector CT imaging of the abdomen and pelvis was performed following the standard protocol without IV contrast. COMPARISON:  Renal ultrasound yesterday. FINDINGS: Lower chest: The lung bases are clear. Mild cardiomegaly. There are coronary artery calcifications. Hepatobiliary: No focal hepatic lesion allowing for lack contrast. Clips in the gallbladder fossa postcholecystectomy. No biliary dilatation. Pancreas: No ductal dilatation or inflammation. Spleen: Normal in size without focal abnormality. Adrenals/Urinary Tract: Low-density left adrenal nodule, partially obscured by motion, however consistent with adenoma. Small low-density right adrenal  nodule measuring 14 mm may be an adenoma or myelolipoma. Mild prominence of bilateral renal collecting systems without frank hydronephrosis, right greater than left. Mild symmetric perinephric edema. Cyst in the interpolar left kidney measures approximately 3.5 cm. Cyst in the right kidney measures 17 mm. No urolithiasis. Foley catheter decompressing the urinary bladder. There is diffuse bladder wall thickening. Intravesicular air likely related to Foley catheter. No external vesicular mass effect. Stomach/Bowel: Colonic diverticulosis throughout the colon. No diverticulitis. Normal appendix. No bowel inflammation or obstruction. The stomach is nondistended. Lack of enteric contrast limits detailed bowel assessment. Vascular/Lymphatic: Aortic and branch atherosclerosis. No aneurysm. No bulky adenopathy, lack of contrast limits assessment. Reproductive: Post hysterectomy with prominence of the vaginal cuff. No adnexal mass. Possible right ovary in situ, quiescent. Other: No ascites or free air. No intra-abdominal abscess. Small fat containing umbilical hernia. Musculoskeletal: Multilevel degenerative change throughout the lumbar spine. There are no acute or suspicious osseous abnormalities. Intramuscular lipoma within right obturator muscle, incidental. IMPRESSION: 1. No urolithiasis. Foley catheter in place with bladder wall thickening. Prominence of both renal collecting systems without frank hydronephrosis. No evidence of obstructive uropathy or bladder outlet obstruction or obstructive uropathy. 2. Colonic diverticulosis without diverticulitis. 3. Bilateral renal cysts. 4. Incidental findings of left adrenal adenoma right adrenal adenoma versus myelolipoma, and Aortic Atherosclerosis (ICD10-I70.0). Electronically Signed   By: Jeb Levering M.D.   On: 12/02/2017 02:34   US Renal  Result Date: 12/01/2017 CLINICAL DATA:  Acute renal injury EXAM: RENAL / URINARY TRACT ULTRASOUND COMPLETE COMPARISON:  04/29/2017  FINDINGS: Limited by patient body habitus and overlying bowel. Right Kidney: Length: 9.9 cm. Echogenicity within normal limits. No mass or hydronephrosis visualized. Left Kidney: Length: 10.4 cm. Echogenicity within normal limits. No nephrolithiasis nor hydronephrosis. Slight exophytic anechoic interpolar renal cyst measuring 2.6 x 2.6 x 2.7 cm. Bladder: Decompressed by Foley catheter. IMPRESSION: Interpolar left renal cyst measuring 2.6 x 2.6 x 2.7 cm. Through transmission is noted. No suspicious septations or nodular foci noted within. Maintained cortical-medullary distinction of the kidneys. Electronically Signed   By: Ashley Royalty M.D.   On: 12/01/2017 03:22   Dg Chest Port 1 View  Result Date:  11/30/2017 CLINICAL DATA:  Sepsis and abdominal pain EXAM: PORTABLE CHEST 1 VIEW COMPARISON:  Chest radiograph 10/22/2017 FINDINGS: Mild cardiomegaly, unchanged. No focal airspace consolidation. No pneumothorax or sizable pleural effusion. No pulmonary edema. Mild aortic arch calcific atherosclerosis. IMPRESSION: Unchanged cardiomegaly and aortic atherosclerosis (ICD10-I70.0) without focal airspace disease. Electronically Signed   By: Ulyses Jarred M.D.   On: 11/30/2017 16:44        Scheduled Meds: . amiodarone  200 mg Oral Daily  . rosuvastatin  10 mg Oral Daily   Continuous Infusions: . dextrose 5 % and 0.9% NaCl       LOS: 2 days    Time spent: 40 minutes    WOODS, Geraldo Docker, MD [facility] Pager [phone]   If 7PM-7AM, please contact night-coverage [email or web address] Password [facility] 12/02/2017, 8:46 AM

## 2017-12-02 NOTE — Progress Notes (Signed)
[facility] ROUNDING NOTE   Subjective:   Urinary retention and acute renal failure with the return of 2 L urine on placing foley  Her creatinine has improved with drainage  CT pending pelvis  Objective:  Vital signs in last 24 hours:  Temp:  [98.1 F (36.7 C)-100.1 F (37.8 C)] 98.9 F (37.2 C) (01/09 0614) Pulse Rate:  [63-76] 70 (01/09 0614) Resp:  [13-26] 15 (01/09 0614) BP: (101-123)/(40-66) 113/65 (01/09 0614) SpO2:  [92 %-100 %] 100 % (01/09 0614) Weight:  [233 lb 1.6 oz (105.7 kg)-247 lb 5.7 oz (112.2 kg)] 247 lb 5.7 oz (112.2 kg) (01/09 2423)  Weight change: -14.4 oz (-7.666 kg) Filed Weights   11/30/17 1631 12/01/17 1456 12/02/17 0614  Weight: 250 lb (113.4 kg) 233 lb 1.6 oz (105.7 kg) 247 lb 5.7 oz (112.2 kg)    Intake/Output: I/O last 3 completed shifts: In: 5361 [P.O.:762; I.V.:1125; IV Piggyback:1550] Out: 4431 [Urine:6475]   Intake/Output this shift:  Total I/O In: 240 [P.O.:240] Out: -   CVS- RRR RS- CTA ABD- BS present soft non-distended EXT- no edema   Basic Metabolic Panel: Recent Labs  Lab 11/30/17 1632 11/30/17 1645 12/01/17 0230 12/02/17 0356  NA 134* 137 138 144  K 4.3 4.3 4.1 3.3*  CL 108 109 111 111  CO2 14*  --  10* 22  GLUCOSE 113* 107* 92 101*  BUN 164* >140* 160* 109*  CREATININE 13.10* 14.00* 10.57* 4.72*  CALCIUM 7.9*  --  7.7* 7.6*  PHOS  --   --   --  3.8    Liver Function Tests: Recent Labs  Lab 11/30/17 1632 12/02/17 0356  AST 35  --   ALT 34  --   ALKPHOS 40  --   BILITOT 0.8  --   PROT 6.0*  --   ALBUMIN 3.2* 2.6*   No results for input(s): LIPASE, AMYLASE in the last 168 hours. No results for input(s): AMMONIA in the last 168 hours.  CBC: Recent Labs  Lab 11/30/17 1632  12/01/17 0230 12/01/17 0621 12/01/17 1527 12/02/17 0356 12/02/17 0926  WBC 6.5  --  6.9  --  6.7 6.9  --   NEUTROABS 5.6  --   --   --   --   --   --   HGB 8.8*   < > 8.7* 7.7* 8.5* 7.4* 7.6*  HCT 26.7*   < >  25.9* 23.7* 26.3* 22.2* 23.6*  MCV 90.8  --  90.6  --  90.4 90.6  --   PLT 163  --  145*  --  158 126*  --    < > = values in this interval not displayed.    Cardiac Enzymes: No results for input(s): CKTOTAL, CKMB, CKMBINDEX, TROPONINI in the last 168 hours.  BNP: Invalid input(s): POCBNP  CBG: No results for input(s): GLUCAP in the last 168 hours.  Microbiology: Results for orders placed or performed during the hospital encounter of 11/30/17  Urine culture     Status: None   Collection Time: 11/30/17  4:17 PM  Result Value Ref Range Status   Specimen Description URINE, RANDOM  Final   Special Requests NONE  Final   Culture NO GROWTH  Final   Report Status 12/01/2017 FINAL  Final  Blood Culture (routine x 2)     Status: None (Preliminary result)   Collection Time: 11/30/17  4:35 PM  Result Value Ref Range Status   Specimen Description BLOOD LEFT ANTECUBITAL  Final   Special Requests   Final    BOTTLES DRAWN AEROBIC AND ANAEROBIC Blood Culture results may not be optimal due to an excessive volume of blood received in culture bottles   Culture NO GROWTH < 24 HOURS  Final   Report Status PENDING  Incomplete  Blood Culture (routine x 2)     Status: None (Preliminary result)   Collection Time: 11/30/17  5:22 PM  Result Value Ref Range Status   Specimen Description BLOOD BLOOD LEFT WRIST  Final   Special Requests   Final    Blood Culture adequate volume BOTTLES DRAWN AEROBIC AND ANAEROBIC   Culture NO GROWTH < 24 HOURS  Final   Report Status PENDING  Incomplete  C difficile quick scan w PCR reflex     Status: None   Collection Time: 11/30/17  8:50 PM  Result Value Ref Range Status   C Diff antigen NEGATIVE NEGATIVE Final   C Diff toxin NEGATIVE NEGATIVE Final   C Diff interpretation No C. difficile detected.  Final  MRSA PCR Screening     Status: None   Collection Time: 12/01/17  3:19 PM  Result Value Ref Range Status   MRSA by PCR NEGATIVE NEGATIVE Final    Comment:         The GeneXpert MRSA Assay (FDA approved for NASAL specimens only), is one component of a comprehensive MRSA colonization surveillance program. It is not intended to diagnose MRSA infection nor to guide or monitor treatment for MRSA infections.     Coagulation Studies: Recent Labs    11/30/17 1632 12/01/17 0230 12/01/17 1527 12/02/17 0356  LABPROT >90.0* >90.0* 23.5* 18.5*  INR >10.00* >10.00* 2.11 1.55    Urinalysis: Recent Labs    11/30/17 1653  COLORURINE YELLOW  LABSPEC 1.013  PHURINE 5.0  GLUCOSEU NEGATIVE  HGBUR MODERATE*  BILIRUBINUR NEGATIVE  KETONESUR NEGATIVE  PROTEINUR NEGATIVE  NITRITE NEGATIVE  LEUKOCYTESUR NEGATIVE      Imaging: Ct Abdomen Pelvis Wo Contrast  Result Date: 12/02/2017 CLINICAL DATA:  Obstructive uropathy at level of bladder. Lower abdominal pain and hematuria. EXAM: CT ABDOMEN AND PELVIS WITHOUT CONTRAST TECHNIQUE: Multidetector CT imaging of the abdomen and pelvis was performed following the standard protocol without IV contrast. COMPARISON:  Renal ultrasound yesterday. FINDINGS: Lower chest: The lung bases are clear. Mild cardiomegaly. There are coronary artery calcifications. Hepatobiliary: No focal hepatic lesion allowing for lack contrast. Clips in the gallbladder fossa postcholecystectomy. No biliary dilatation. Pancreas: No ductal dilatation or inflammation. Spleen: Normal in size without focal abnormality. Adrenals/Urinary Tract: Low-density left adrenal nodule, partially obscured by motion, however consistent with adenoma. Small low-density right adrenal nodule measuring 14 mm may be an adenoma or myelolipoma. Mild prominence of bilateral renal collecting systems without frank hydronephrosis, right greater than left. Mild symmetric perinephric edema. Cyst in the interpolar left kidney measures approximately 3.5 cm. Cyst in the right kidney measures 17 mm. No urolithiasis. Foley catheter decompressing the urinary bladder. There is diffuse  bladder wall thickening. Intravesicular air likely related to Foley catheter. No external vesicular mass effect. Stomach/Bowel: Colonic diverticulosis throughout the colon. No diverticulitis. Normal appendix. No bowel inflammation or obstruction. The stomach is nondistended. Lack of enteric contrast limits detailed bowel assessment. Vascular/Lymphatic: Aortic and branch atherosclerosis. No aneurysm. No bulky adenopathy, lack of contrast limits assessment. Reproductive: Post hysterectomy with prominence of the vaginal cuff. No adnexal mass. Possible right ovary in situ, quiescent. Other: No ascites or free air. No intra-abdominal abscess. Small fat  containing umbilical hernia. Musculoskeletal: Multilevel degenerative change throughout the lumbar spine. There are no acute or suspicious osseous abnormalities. Intramuscular lipoma within right obturator muscle, incidental. IMPRESSION: 1. No urolithiasis. Foley catheter in place with bladder wall thickening. Prominence of both renal collecting systems without frank hydronephrosis. No evidence of obstructive uropathy or bladder outlet obstruction or obstructive uropathy. 2. Colonic diverticulosis without diverticulitis. 3. Bilateral renal cysts. 4. Incidental findings of left adrenal adenoma right adrenal adenoma versus myelolipoma, and Aortic Atherosclerosis (ICD10-I70.0). Electronically Signed   By: Jeb Levering M.D.   On: 12/02/2017 02:34   US Renal  Result Date: 12/01/2017 CLINICAL DATA:  Acute renal injury EXAM: RENAL / URINARY TRACT ULTRASOUND COMPLETE COMPARISON:  04/29/2017 FINDINGS: Limited by patient body habitus and overlying bowel. Right Kidney: Length: 9.9 cm. Echogenicity within normal limits. No mass or hydronephrosis visualized. Left Kidney: Length: 10.4 cm. Echogenicity within normal limits. No nephrolithiasis nor hydronephrosis. Slight exophytic anechoic interpolar renal cyst measuring 2.6 x 2.6 x 2.7 cm. Bladder: Decompressed by Foley catheter.  IMPRESSION: Interpolar left renal cyst measuring 2.6 x 2.6 x 2.7 cm. Through transmission is noted. No suspicious septations or nodular foci noted within. Maintained cortical-medullary distinction of the kidneys. Electronically Signed   By: Ashley Royalty M.D.   On: 12/01/2017 03:22   Dg Chest Port 1 View  Result Date: 11/30/2017 CLINICAL DATA:  Sepsis and abdominal pain EXAM: PORTABLE CHEST 1 VIEW COMPARISON:  Chest radiograph 10/22/2017 FINDINGS: Mild cardiomegaly, unchanged. No focal airspace consolidation. No pneumothorax or sizable pleural effusion. No pulmonary edema. Mild aortic arch calcific atherosclerosis. IMPRESSION: Unchanged cardiomegaly and aortic atherosclerosis (ICD10-I70.0) without focal airspace disease. Electronically Signed   By: Ulyses Jarred M.D.   On: 11/30/2017 16:44     Medications:   . dextrose 5 % and 0.9% NaCl 75 mL/hr at 12/02/17 0959   . amiodarone  200 mg Oral Daily  . rosuvastatin  10 mg Oral Daily   ondansetron **OR** ondansetron (ZOFRAN) IV  Assessment/ Plan:   Acute kidney injury with some obstructive/  Retention pathology would continue foley and evaluate with plelvic CT  Anemia check Iron studies   Metabolic acidosis improved  Changed to 1/2 NS 75 cc/hr  HTN  Blood pressure soft would not give antihypertensives  CT showed Left Adrenal mass  1.4cm    LOS: 2 Quaniya Damas W @TODAY @10 :46 AM

## 2017-12-03 ENCOUNTER — Encounter (HOSPITAL_COMMUNITY): Payer: Self-pay | Admitting: Physician Assistant

## 2017-12-03 DIAGNOSIS — E279 Disorder of adrenal gland, unspecified: Secondary | ICD-10-CM

## 2017-12-03 DIAGNOSIS — Z7901 Long term (current) use of anticoagulants: Secondary | ICD-10-CM

## 2017-12-03 DIAGNOSIS — D62 Acute posthemorrhagic anemia: Secondary | ICD-10-CM

## 2017-12-03 DIAGNOSIS — K922 Gastrointestinal hemorrhage, unspecified: Secondary | ICD-10-CM

## 2017-12-03 DIAGNOSIS — D696 Thrombocytopenia, unspecified: Secondary | ICD-10-CM

## 2017-12-03 LAB — BASIC METABOLIC PANEL
ANION GAP: 5 (ref 5–15)
BUN: 54 mg/dL — AB (ref 6–20)
CHLORIDE: 113 mmol/L — AB (ref 101–111)
CO2: 29 mmol/L (ref 22–32)
Calcium: 7.8 mg/dL — ABNORMAL LOW (ref 8.9–10.3)
Creatinine, Ser: 1.87 mg/dL — ABNORMAL HIGH (ref 0.44–1.00)
GFR calc Af Amer: 32 mL/min — ABNORMAL LOW (ref >60)
GFR, EST NON AFRICAN AMERICAN: 27 mL/min — AB (ref >60)
GLUCOSE: 100 mg/dL — AB (ref 65–99)
POTASSIUM: 3.9 mmol/L (ref 3.5–5.1)
Sodium: 147 mmol/L — ABNORMAL HIGH (ref 135–145)

## 2017-12-03 LAB — CBC
HEMATOCRIT: 23.3 % — AB (ref 36.0–46.0)
HEMATOCRIT: 31.3 % — AB (ref 36.0–46.0)
HEMOGLOBIN: 7.7 g/dL — AB (ref 12.0–15.0)
HEMOGLOBIN: 9.8 g/dL — AB (ref 12.0–15.0)
MCH: 30.7 pg (ref 26.0–34.0)
MCH: 29.1 pg (ref 26.0–34.0)
MCHC: 33 g/dL (ref 30.0–36.0)
MCHC: 31.3 g/dL (ref 30.0–36.0)
MCV: 92.8 fL (ref 78.0–100.0)
MCV: 92.9 fL (ref 78.0–100.0)
Platelets: 113 10*3/uL — ABNORMAL LOW (ref 150–400)
Platelets: 116 10*3/uL — ABNORMAL LOW (ref 150–400)
RBC: 2.51 MIL/uL — AB (ref 3.87–5.11)
RBC: 3.37 MIL/uL — ABNORMAL LOW (ref 3.87–5.11)
RDW: 15.1 % (ref 11.5–15.5)
RDW: 15.6 % — ABNORMAL HIGH (ref 11.5–15.5)
WBC: 7.3 10*3/uL (ref 4.0–10.5)
WBC: 8.6 10*3/uL (ref 4.0–10.5)

## 2017-12-03 LAB — PROTIME-INR
INR: 1.36
Prothrombin Time: 16.7 seconds — ABNORMAL HIGH (ref 11.4–15.2)

## 2017-12-03 LAB — MAGNESIUM: MAGNESIUM: 1.8 mg/dL (ref 1.7–2.4)

## 2017-12-03 LAB — PREPARE RBC (CROSSMATCH)

## 2017-12-03 MED ORDER — BISACODYL 5 MG PO TBEC
5.0000 mg | DELAYED_RELEASE_TABLET | Freq: Once | ORAL | Status: AC
  Administered 2017-12-03: 5 mg via ORAL
  Filled 2017-12-03: qty 1

## 2017-12-03 MED ORDER — METOCLOPRAMIDE HCL 5 MG/5ML PO SOLN
10.0000 mg | Freq: Once | ORAL | Status: DC
  Filled 2017-12-03: qty 10

## 2017-12-03 MED ORDER — SODIUM CHLORIDE 0.9 % IV SOLN
Freq: Once | INTRAVENOUS | Status: AC
  Administered 2017-12-03: 10:00:00 via INTRAVENOUS

## 2017-12-03 MED ORDER — PEG-KCL-NACL-NASULF-NA ASC-C 100 G PO SOLR
1.0000 | Freq: Once | ORAL | Status: AC
  Administered 2017-12-03: 200 g via ORAL
  Filled 2017-12-03: qty 1

## 2017-12-03 MED ORDER — DEXTROSE 5 % IV SOLN
INTRAVENOUS | Status: DC
  Administered 2017-12-03 – 2017-12-04 (×2): via INTRAVENOUS

## 2017-12-03 MED ORDER — METOCLOPRAMIDE HCL 5 MG/5ML PO SOLN
10.0000 mg | Freq: Once | ORAL | Status: AC
  Administered 2017-12-03: 10 mg via ORAL
  Filled 2017-12-03: qty 10

## 2017-12-03 MED ORDER — SODIUM CHLORIDE 0.45 % IV SOLN
INTRAVENOUS | Status: DC
  Administered 2017-12-03: 01:00:00 via INTRAVENOUS

## 2017-12-03 NOTE — H&P (View-Only) (Signed)
[facility] Consult: 8:25 AM 12/03/2017  LOS: 3 days    Referring Provider: Dr Arnette Felts  Primary Care Physician:  Biagio Borg, MD Primary Gastroenterologist:  Dr. Fuller Plan    Reason for Consultation: Painless hematochezia.     HPI: Patricia Wagner is a 67 y.o. female.  Hx Afib, failed/recurred post multiple CVs, on Coumadin.  CVA 2017.  NICM (06/2017 EF 30 to 35%).  Non-obstructive CAD, cath 11/207.  Cardiogenic shock in setting of persistent A fib 04/2017 CKD stage 3-4. Chronic LE edema.  Suspected OSA.   Thrombocytopenia dating to 2017, platelets 95 in 04/2017.  2001 Colonoscopy.  Scattered tics.  Random bx: unremarkable 02/2011 Colonoscopy.  Avg risk screening.  Scattered pan diverticulosis.  5 mm sessile, sigmoid polyp (TA, no HGD). Pt has not responded to colonoscopy call back letter sent 12/2015.    Treated for E coli UTI 12/18, treated with Keflex.  C/o urinary retention, pelvic discomfort at 12/31 ROV with PMD.  Declined suggested/proffered labs, CT and advise to go to ED. Dr Knute Neu course of Cipro.     Admitted earlier this week with obstructive uropathy (2 liters returned after foley placed), hematuria, AKI, n/v. Coagulopathy (PT >90, INR >10).  Hematuria resolved and AKI improved, GFR 3 >> 32.     She had vomited once at home, emesis was brown but no gross blood.  Hard to say if this was coffee grounds or not.  It only happened once.  She no longer has any nausea.  At arrival she had a pins and needle feeling in her suprapubic region but no abdominal pain, this has resolved.  Since arrival she has been passing bright red blood per rectum.  Several episodes a day but this tapered off, yesterday she had passed a small amount of blood in the last episode was about 7 PM yesterday.  She had no previous history of  bleeding per rectum.  Generally has regular formed, brown stools.   Patient generally has a great appetite.  Does not suffer heartburn.  No dysphagia.  Occasional globus sensation. Hgb baseline 12 to 13.  Hgb since arrival: 8.8 >> 7.7 >> 7.4 >> 1 U PRBCs on 1/9 >> 7.7 today.  recieving 2nd U of blood.  MCV normal.   Platelets 113.   PT/INR 16.7/1.3. Non-contrast abd/pelvic CT: colon diverticulosis.  No clear source for urinary obstruction. Left adrenal adenoma, right adrenal ademona vs myelipoma.  Liver and slpleen unremarkable.    Fm Hx + CAD, her twin sister has ESRD and is on dialysis.  No hx cancers, PUD, anemia, GIB.          Past Medical History:  Diagnosis Date  . Arthritis   . Atrial fibrillation ([place])    a. s/p multiple cardioversions; failed tikosyn/sotalol.  Marland Kitchen CAD in native artery, s/p cardiac cath with non obstructive CAD 10/24/2016  . CHF (congestive heart failure) ([place])   . DIVERTICULITIS, HX OF 07/25/2007  . DIVERTICULOSIS, COLON 07/22/2007  . Dyspnea   . Edema, peripheral    a.  chronic BLE edema, R>L. Prior trauma from dog attack and accident.  Marland Kitchen HYPERLIPIDEMIA 02/03/2008  . Hypersomnia    declines w/u  . Hypertension   . MENOPAUSAL DISORDER 01/09/2011  . Morbid obesity ([place]) 07/22/2007  . NICM (nonischemic cardiomyopathy) ([place]) 10/24/2016  . Raynaud's syndrome 07/22/2007  . Stroke ([place]) 2017  . THYROID NODULE, RIGHT 01/04/2010  . VITAMIN D DEFICIENCY 01/09/2011   Qualifier: Diagnosis of  By: [name], [name]     Past Surgical History:  Procedure Laterality Date  . CARDIAC CATHETERIZATION N/A 10/23/2016   Procedure: Left Heart Cath and Coronary Angiography;  Surgeon: [name], MD;  Location: [facility];  Service: Cardiovascular;  Laterality: N/A;  . CARDIOVERSION N/A 10/31/2016   Procedure: CARDIOVERSION;  Surgeon: [name], MD;  Location: [place] [place];  Service: Cardiovascular;  Laterality: N/A;  . CARDIOVERSION N/A 11/03/2016   Procedure:  CARDIOVERSION;  Surgeon: [name], MD;  Location: [place];  Service: Cardiovascular;  Laterality: N/A;  . CARDIOVERSION N/A 11/18/2016   Procedure: CARDIOVERSION;  Surgeon: [name], MD;  Location: [facility] ENDOSCOPY;  Service: Cardiovascular;  Laterality: N/A;  . CARDIOVERSION N/A 03/25/2017   Procedure: CARDIOVERSION;  Surgeon: [name], MD;  Location: [facility]OSCOPY;  Service: Cardiovascular;  Laterality: N/A;  . CARDIOVERSION N/A 05/06/2017   Procedure: CARDIOVERSION;  Surgeon: [name], MD;  Location: [facility] ENDOSCOPY;  Service: Cardiovascular;  Laterality: N/A;  . CARDIOVERSION N/A 05/12/2017   Procedure: CARDIOVERSION;  Surgeon: [name], MD;  Location: [facility] ENDOSCOPY;  Service: Cardiovascular;  Laterality: N/A;  . COLONOSCOPY    . PARTIAL HYSTERECTOMY     1 OVARY LEFT  . POLYPECTOMY    . TEE WITHOUT CARDIOVERSION N/A 10/31/2016   Procedure: TRANSESOPHAGEAL ECHOCARDIOGRAM (TEE);  Surgeon: [name], MD;  Location: [facility] ENDOSCOPY;  Service: Cardiovascular;  Laterality: N/A;  . TEE WITHOUT CARDIOVERSION N/A 03/25/2017   Procedure: TRANSESOPHAGEAL ECHOCARDIOGRAM (TEE);  Surgeon: [name], MD;  Location: [facility] ENDOSCOPY;  Service: Cardiovascular;  Laterality: N/A;    Prior to Admission medications   Medication Sig Start Date End Date Taking? Authorizing Provider  acetaminophen (TYLENOL) 325 MG tablet Take 650 mg by mouth every 6 (six) hours as needed (pain).    Yes [provider]  amiodarone (PACERONE) 200 MG tablet Take 1 tablet (200 mg total) by mouth daily. 05/29/17  Yes Shirley Friar, PA-C  carvedilol (COREG) 3.125 MG tablet Take 1 tablet (3.125 mg total) by mouth 2 (two) times daily. 07/09/17 11/30/17 Yes [name], MD  diclofenac sodium (VOLTAREN) 1 % GEL Apply 4 g topically 4 (four) times daily as needed (As directed for skin). Patient taking differently: Apply 4 g topically 4 (four) times daily as needed (for pain).   10/23/17  Yes Strader, [place], PA-C  furosemide (LASIX) 40 MG tablet Take 20 mg by mouth daily.   Yes [provider]  Investigational - Study Medication Take 1 tablet by mouth 2 (two) times daily. Study name: Galactic HF Study  Additional study details: Omecamtiv Mecarbil or Placebo 09/17/17  Yes Larey Dresser, MD  isosorbide-hydrALAZINE (BIDIL) 20-37.5 MG tablet Take 1 tablet by mouth 3 (three) times daily. Patient taking differently: Take 1 tablet by mouth 2 (two) times daily.  10/20/17  Yes [name], MD  ranolazine (RANEXA) 500 MG 12 hr tablet Take 1 tablet (500 mg total) by mouth 2 (two) times daily. 09/14/17  Yes Bensimhon, Shaune Pascal, MD  rosuvastatin (CRESTOR) 10 MG tablet TAKE 1 TABLET (10 MG TOTAL) BY MOUTH DAILY. 10/07/17  Yes Biagio Borg, MD  sacubitril-valsartan (ENTRESTO) 49-51 MG Take 1 tablet by mouth 2 (two) times daily. 08/19/17  Yes Bensimhon, Shaune Pascal, MD  spironolactone (ALDACTONE) 25 MG tablet Take 0.5 tablets (12.5 mg total) by mouth daily. 10/23/17  Yes Strader, Fransisco Hertz, PA-C  tamsulosin (FLOMAX) 0.4 MG CAPS capsule Take 1 capsule (0.4 mg total) by mouth daily. 11/23/17  Yes Biagio Borg, MD  warfarin (COUMADIN) 3 MG tablet TAKE 1 TABLET BY MOUTH DAILY OR AS DIRECTED BY COUMADIN CLINIC Patient taking differently: Take 3 mg by mouth at bedtime on Sun/Mon/Tues/Thurs/Fri/Sat and 4.5 mg on Wed 08/10/17  Yes [name], Denice Bors, MD  furosemide (LASIX) 20 MG tablet Take 1 tablet (20 mg total) by mouth daily. Patient not taking: Reported on 11/30/2017 10/27/17   Erma Heritage, PA-C    Scheduled Meds: . amiodarone  200 mg Oral Daily  . potassium chloride  40 mEq Oral Once  . rosuvastatin  10 mg Oral Daily   Infusions: . sodium chloride 75 mL/hr at 12/03/17 0034  . sodium chloride    . sodium chloride     PRN Meds: ondansetron **OR** ondansetron (ZOFRAN) IV   Allergies as of 11/30/2017 - Review Complete 11/30/2017  Allergen Reaction Noted  .  Ace inhibitors Palpitations 03/20/2011    Family History  Problem Relation Age of Onset  . Asthma Mother   . Diabetes Father   . Heart disease Father        Died of presumed heart attack - 39s  . Lung disease Sister   . Heart disease Sister        Twin sister has heart issue, unclear what kind  . Thyroid disease Neg Hx     Social History   Socioeconomic History  . Marital status: Single    Spouse name: Not on file  . Number of children: 1  . Years of education: Not on file  . Highest education level: Not on file  Social Needs  . Financial resource strain: Not on file  . Food insecurity - worry: Not on file  . Food insecurity - inability: Not on file  . Transportation needs - medical: Not on file  . Transportation needs - non-medical: Not on file  Occupational History  . Occupation: Academic librarian   Tobacco Use  . Smoking status: Current Every Day Smoker    Packs/day: 0.50    Types: Cigarettes    Last attempt to quit: 10/23/2016    Years since quitting: 1.1  . Smokeless tobacco: Never Used  . Tobacco comment: Smoked for 50 years  Substance and Sexual Activity  . Alcohol use: No    Alcohol/week: 2.4 oz    Types: 4 Cans of beer per week    Frequency: Never    Comment: Intermittent  . Drug use: No  . Sexual activity: Not on file  Other Topics Concern  . Not on file  Social History Narrative  . Not on file    REVIEW OF SYSTEMS: Constitutional: In the weeks leading up to admission, she felt bad.  Kind of weak.  Just generally felt unwell.  Now that she is voiding with the assistance of the Foley, she feels a lot better. ENT: Within the past few weeks she had a single episode of clearing some small amount of blood from her throat, at the same time she blew her nose and  there was a small but this was not a significant episode of epistaxis. Pulm: Patient's breathing is fine today.  No cough. CV:  Gained 9# in last several weeks PTA.  Reviewed the weight measurements for this  patient, I do not think they are accurate.  Measurements 250 # >> 233 # >> 247 # >> 229.   GU:  Per HPI.  Hematuria has resolved. GI:  Per HPI Heme:  Per HPI.  Patient generally does not suffer from unusual bleeding or bruising.  She says she was anemic at birth.  She is never required iron supplementation or previous blood transfusions. Transfusions:  Per HPI Neuro: Her stroke left her with slightly slow speech and right upper extremity weakness.  No headaches, no peripheral tingling or numbness Derm:  No itching, no rash or sores.  Endocrine:  No sweats or chills.  No polyuria or dysuria Immunization: Reviewed.  She is current on Pneumovax and flu shot. Travel:  None beyond local counties in last few months.    PHYSICAL EXAM: Vital signs in last 24 hours: Vitals:   12/03/17 0325 12/03/17 0753  BP: 140/63 (!) 146/71  Pulse: 79 69  Resp: 20 (!) 22  Temp: 98.9 F (37.2 C) 99.1 F (37.3 C)  SpO2: 95% 96%   Wt Readings from Last 3 Encounters:  12/03/17 104 kg (229 lb 3.2 oz)  11/30/17 112.3 kg (247 lb 8 oz)  11/23/17 110.3 kg (243 lb 1.9 oz)    General: Pleasant, overweight AAF.  She is comfortable and fully alert Head: No facial asymmetry.  No facial edema.  No signs of head trauma. Eyes: No scleral icterus.  No conjunctival pallor.  EOMI. Ears: Not HOH Nose: No discharge or congestion. Mouth: Oropharynx moist and clear.  Tongue midline.  Good dentition. Neck: No JVD.  No masses.  No thyromegaly. Lungs: Clear bilaterally.  No cough or labored breathing.  Good breath sounds. Heart: Currently in sinus rhythm in the 70s.  No MRG.  S1, S2 present. Abdomen: Obese.  Soft.  Nontender.  Nondistended.  Bowel sounds normoactive.  Or masses.  No hernias..   Rectal: Good rectal tone.  No palpable or visible masses.  Exam glove with dark blood. Musc/Skeltl: No joint redness, swelling or significant deformities. Extremities: Nonpitting edema of the right foot. Neurologic: Alert.  Oriented  x3.  Moves all 4 limbs, strength not tested.  No gross deficits.  No tremors. Skin: No rashes, sores, suspicious lesions. Tattoos: None Nodes: No cervical adenopathy Psych: Pleasant, cooperative, calm.  Good historian.  Intake/Output from previous day: 01/09 0701 - 01/10 0700 In: 2002.5 [P.O.:1460; I.V.:182.5; Blood:360] Out: 2450 [Urine:2450] Intake/Output this shift: Total I/O In: -  Out: 1000 [Urine:1000]  LAB RESULTS: Recent Labs    12/01/17 1527 12/02/17 0356 12/02/17 0926 12/03/17 0309  WBC 6.7 6.9  --  7.3  HGB 8.5* 7.4* 7.6* 7.7*  HCT 26.3* 22.2* 23.6* 23.3*  PLT 158 126*  --  113*   BMET Lab Results  Component Value Date   NA 147 (H) 12/03/2017   NA 144 12/02/2017   NA 138 12/01/2017   K 3.9 12/03/2017   K 3.6 12/02/2017   K 3.3 (L) 12/02/2017   CL 113 (H) 12/03/2017   CL 111 12/02/2017   CL 111 12/01/2017   CO2 29 12/03/2017   CO2 22 12/02/2017   CO2 10 (L) 12/01/2017   GLUCOSE 100 (H) 12/03/2017   GLUCOSE 101 (H) 12/02/2017   GLUCOSE 92 12/01/2017  BUN 54 (H) 12/03/2017   BUN 109 (H) 12/02/2017   BUN 160 (H) 12/01/2017   CREATININE 1.87 (H) 12/03/2017   CREATININE 4.72 (H) 12/02/2017   CREATININE 10.57 (H) 12/01/2017   CALCIUM 7.8 (L) 12/03/2017   CALCIUM 7.6 (L) 12/02/2017   CALCIUM 7.7 (L) 12/01/2017   LFT Recent Labs    11/30/17 1632 12/02/17 0356  PROT 6.0*  --   ALBUMIN 3.2* 2.6*  AST 35  --   ALT 34  --   ALKPHOS 40  --   BILITOT 0.8  --    PT/INR Lab Results  Component Value Date   INR 1.36 12/03/2017   INR 1.55 12/02/2017   INR 2.11 12/01/2017   Hepatitis Panel No results for input(s): HEPBSAG, HCVAB, HEPAIGM, HEPBIGM in the last 72 hours. C-Diff No components found for: CDIFF Lipase  No results found for: LIPASE  Drugs of Abuse  No results found for: LABOPIA, COCAINSCRNUR, LABBENZ, AMPHETMU, THCU, LABBARB   RADIOLOGY STUDIES: Ct Abdomen Pelvis Wo Contrast  Result Date: 12/02/2017 CLINICAL DATA:  Obstructive  uropathy at level of bladder. Lower abdominal pain and hematuria. EXAM: CT ABDOMEN AND PELVIS WITHOUT CONTRAST TECHNIQUE: Multidetector CT imaging of the abdomen and pelvis was performed following the standard protocol without IV contrast. COMPARISON:  Renal ultrasound yesterday. FINDINGS: Lower chest: The lung bases are clear. Mild cardiomegaly. There are coronary artery calcifications. Hepatobiliary: No focal hepatic lesion allowing for lack contrast. Clips in the gallbladder fossa postcholecystectomy. No biliary dilatation. Pancreas: No ductal dilatation or inflammation. Spleen: Normal in size without focal abnormality. Adrenals/Urinary Tract: Low-density left adrenal nodule, partially obscured by motion, however consistent with adenoma. Small low-density right adrenal nodule measuring 14 mm may be an adenoma or myelolipoma. Mild prominence of bilateral renal collecting systems without frank hydronephrosis, right greater than left. Mild symmetric perinephric edema. Cyst in the interpolar left kidney measures approximately 3.5 cm. Cyst in the right kidney measures 17 mm. No urolithiasis. Foley catheter decompressing the urinary bladder. There is diffuse bladder wall thickening. Intravesicular air likely related to Foley catheter. No external vesicular mass effect. Stomach/Bowel: Colonic diverticulosis throughout the colon. No diverticulitis. Normal appendix. No bowel inflammation or obstruction. The stomach is nondistended. Lack of enteric contrast limits detailed bowel assessment. Vascular/Lymphatic: Aortic and branch atherosclerosis. No aneurysm. No bulky adenopathy, lack of contrast limits assessment. Reproductive: Post hysterectomy with prominence of the vaginal cuff. No adnexal mass. Possible right ovary in situ, quiescent. Other: No ascites or free air. No intra-abdominal abscess. Small fat containing umbilical hernia. Musculoskeletal: Multilevel degenerative change throughout the lumbar spine. There are no  acute or suspicious osseous abnormalities. Intramuscular lipoma within right obturator muscle, incidental. IMPRESSION: 1. No urolithiasis. Foley catheter in place with bladder wall thickening. Prominence of both renal collecting systems without frank hydronephrosis. No evidence of obstructive uropathy or bladder outlet obstruction or obstructive uropathy. 2. Colonic diverticulosis without diverticulitis. 3. Bilateral renal cysts. 4. Incidental findings of left adrenal adenoma right adrenal adenoma versus myelolipoma, and Aortic Atherosclerosis (ICD10-I70.0). Electronically Signed   By: Jeb Levering M.D.   On: 12/02/2017 02:34     IMPRESSION:   *   Painless hematochezia in setting of INR >10.  ? Diverticular bleed?.  Bleeding has slowed and last episode was PM 1/9.  On exam today has dark, ?old, blood.   Single episode brown emesis PTA, non since.   9 months overdue for surveillance colonoscopy (TA polyp 02/2011).  Low suspicion for neoplasm causing GIB.    *  Blood loss anemia. Receiving 2nd PRBC now.     *  Obstructive uropathy with hematuria and AKI  *  Iatrogennic coagulopathy, resolved.  Chronic Coumadin on hold.  Indication: Afib.   INR 1.3 today.  Set up for A fib ablation 12/11/2017.  However I see that the pre-operative cardiac CT has been canceled so not clear that she is actually going to undergo the ablation, it may be that it is going to be delayed.  *  Non-critical thrombocytopenia dating to 2017.  No cirrhosis or splenomegaly.      PLAN:     *  D/w Dr Henrene Pastor: plan colonoscopy tomorrow.    Azucena Freed  12/03/2017, 8:25 AM Pager: [phone]  GI ATTENDING  History, laboratories, x-rays, prior colonoscopy reports reviewed. Patient personally seen and examined. Agree with comprehensive consultation note as outlined above. 67 year old with multiple significant medical problems. Admitted with obstructive uropathy. Markedly elevated prothrombin time on admission. She is on  chronic Coumadin for chronic atrial fibrillation. Subsequently developed recurrent rectal bleeding without hemodynamic compromise. Bleeding has stopped as the coagulopathy has corrected. She has had several colonoscopies in the past. Most recently 2012 with diminutive adenoma and significant pandiverticulosis. Suspect recurrent bleed is secondary to diverticular source in the face of over anticoagulation. I do think that she should undergo colonoscopy at this time to evaluate the bleeding, provide adenomatous polyp surveillance, and prognosticate as she may need to resume long-term anticoagulation therapy. The patient is high risk given her comorbidities.The nature of the procedure, as well as the risks, benefits, and alternatives were carefully and thoroughly reviewed with the patient. Ample time for discussion and questions allowed. The patient understood, was satisfied, and agreed to proceed. She will prep today with the examination planned for tomorrow at noon. Thank you.  Docia Chuck. Geri Seminole., M.D. [facility] Division of Gastroenterology

## 2017-12-03 NOTE — Progress Notes (Signed)
PROGRESS NOTE    Patricia Wagner  KDX:[id number] DOB: 1951/05/02 DOA: 11/30/2017 PCP: Biagio Borg, MD   Brief Narrative:  67 y.o. BF PMHx CVA, A-Fib on coumadin, Chronic Systolic CHF/NICM,  A. Fib S/P multiple failed DCCV, CAD, HTN,HLD, Menopausal DO,  Arthritis, DIVERTICULITIS, DIVERTICULOSIS, Morbid Obesity, Raynaud's syndrome, THYROID NODULE, RIGHT  Presented with over a week of worsening inability to urinate.  She reported dribbling only, with the inability to fully empty her bladder, associated w/ suprapubic pain.   In the ED a foley returned over 2 liters of urine.  She was also noted to have AKI with creatinine over 14.     Subjective: 1/10  A/O 4, negative SOB, negative CP, negative abdominal pain, negative N/V. Positive melena   Assessment & Plan:   Principal Problem:   Acute renal failure superimposed on chronic kidney disease ([place]) Active Problems:   Essential hypertension   Raynaud's syndrome   CAD in native artery, s/p cardiac cath with non obstructive CAD   CHF (congestive heart failure) ([place])   Cerebrovascular accident (CVA) due to embolism of left cerebellar artery ([facility])   Atrial fibrillation ([place])   Subtherapeutic international normalized ratio (INR)   Diarrhea   Acute renal failure on CKD Stage 3 -Renal ultrasound: Negative hydronephrosis Recent Labs  Lab 11/30/17 1632 11/30/17 1645 12/01/17 0230 12/02/17 0356 12/03/17 0309  CREATININE 13.10* 14.00* 10.57* 4.72* 1.87*  -improving with hydration  -D5 at 11ml/hr -S/P Foley placement -Nephrology following  Obstructive uropathy -Unclear etiology -CT abdomen pelvis nondiagnostic for cause see results below  LEFT Adrenal mass -Incidental finding on CT scan see results below. Ensure PCP follows up on mass may require Endocrine consult as outpatient.   Chronic Atrial fib on Coumadin/Supratherapeutic INR Recent Labs  Lab 11/30/17 1632 12/01/17 0230 12/01/17 1527 12/02/17 0356 12/03/17 0309  INR  >10.00* >10.00* 2.11 1.55 1.36  -IR now normalized -Multiple failed DCCV -Per EMR scheduled for another ablation with Dr. Curt Bears EP this month. -Set up for A fib ablation 12/11/2017.  However  pre-operative cardiac CT has been canceled so not clear that she is actually going to undergo the ablation,   Chronic systolic CHF/Nonischemic Cardiomyopathy -Strict in and out -Daily weight -Transfusion hemoglobin<8 -1/9 transfuse 1 unit PRBC -1/10 transfuse 1 unit PRBC  Essential HTN   GI bleed/acute blood loss anemia vs anemia chronic disease -BRBPR -1/7 fecal occult positive (INR supratherapeutic at the time) - 1/9 INR corrected repeat fecal occult blood: INR daily -Anemia panel pending Recent Labs  Lab 12/01/17 0230 12/01/17 0621 12/01/17 1527 12/02/17 0356 12/02/17 0926 12/03/17 0309  HGB 8.7* 7.7* 8.5* 7.4* 7.6* 7.7*  -consulted GI: Patient overdue for repeat colonoscopy. GI to perform colonoscopy on 1/11    Coumadin-induced coagulopathy Given ongoing GIB was tx w/ IV vitamin K - hope to simply lower INR to therapeutic range and not reverse entirely, but if drops below 2.0 will consider heparin gtt as long as hgb ok Recent Labs  Lab 11/30/17 1632 12/01/17 0230 12/01/17 1527 12/02/17 0356 12/03/17 0309  INR >10.00* >10.00* 2.11 1.55 1.36     HLD   Hypokalemia -Potassium goal> 4  Hypomagnesemia Magnesium goal> 2     DVT prophylaxis: SCD Code Status: full Family Communication: none Disposition Plan: TBD   Consultants:  Nephrology     Procedures/Significant Events:  07/15/2017 Echocardiogram:- Left ventricle: severe concentric hypertrophy.  -LVEF=  30% to 35%. Diffuse hypokinesis. - Mitral valve: mild to moderate regurgitation. -Left atrium: Severely  dilated. -Right atrium: moderately dilated. - Pericardium, extracardiac: A trivial pericardial effusion was   identified.    ----------------------------------------------------------------------------------------------------------------------------------- 1/9 CT abdomen and pelvis WO contrast:No urolithiasis. Foley catheter in place with bladder wall thickening. Prominence of both renal collecting systems without frank hydronephrosis. No evidence of obstructive uropathy or bladder outlet obstruction or obstructive uropathy. 2. Colonic diverticulosis without diverticulitis. 3. Bilateral renal cysts. 4. Incidental findings of left adrenal adenoma right adrenal adenoma versus myelolipoma, and Aortic Atherosclerosis (ICD10-I70.0).   I have personally reviewed and interpreted all radiology studies and my findings are as above.  VENTILATOR SETTINGS:    Cultures   Antimicrobials: Anti-infectives (From admission, onward)   Start     Stop   12/01/17 0000  piperacillin-tazobactam (ZOSYN) IVPB 3.375 g  Status:  Discontinued     11/30/17 1659   11/30/17 1700  cefTRIAXone (ROCEPHIN) 1 g in dextrose 5 % 50 mL IVPB     11/30/17 1804   11/30/17 1630  piperacillin-tazobactam (ZOSYN) IVPB 3.375 g  Status:  Discontinued     11/30/17 1701       Devices    LINES / TUBES:      Continuous Infusions: . sodium chloride 75 mL/hr at 12/03/17 0034  . sodium chloride       Objective: Vitals:   12/03/17 0007 12/03/17 0030 12/03/17 0325 12/03/17 0753  BP: 114/63 (!) 121/59 140/63 (!) 146/71  Pulse: 71 65 79 69  Resp: 17 20 20  (!) 22  Temp: 98.8 F (37.1 C) 98.8 F (37.1 C) 98.9 F (37.2 C) 99.1 F (37.3 C)  TempSrc: Oral Oral Oral Oral  SpO2: 94%  95% 96%  Weight:   229 lb 3.2 oz (104 kg)   Height:        Intake/Output Summary (Last 24 hours) at 12/03/2017 0809 Last data filed at 12/03/2017 0745 Gross per 24 hour  Intake 2002.5 ml  Output 3450 ml  Net -1447.5 ml   Filed Weights   12/01/17 1456 12/02/17 0614 12/03/17 0325  Weight: 233 lb 1.6 oz (105.7 kg) 247 lb 5.7 oz (112.2 kg) 229 lb 3.2 oz  (104 kg)    Physical Exam:  General: A/O 4, No acute respiratory distress Neck:  Negative scars, masses, torticollis, lymphadenopathy, JVD Lungs: Clear to auscultation bilaterally without wheezes or crackles Cardiovascular: Regular rate and rhythm without murmur gallop or rub normal S1 and S2 Abdomen: negative abdominal pain, nondistended, positive soft, bowel sounds, no rebound, no ascites, no appreciable mass Extremities: No significant cyanosis, clubbing, or edema bilateral lower extremities Skin: Negative rashes, lesions, ulcers Psychiatric:  Negative depression, negative anxiety, negative fatigue, negative mania  Central nervous system:  Cranial nerves II through XII intact, tongue/uvula midline, all extremities muscle strength 5/5, sensation intact throughout, negative dysarthria, negative expressive aphasia, negative receptive aphasia.     Data Reviewed: Care during the described time interval was provided by me .  I have reviewed this patient's available data, including medical history, events of note, physical examination, and all test results as part of my evaluation.   CBC: Recent Labs  Lab 11/30/17 1632  12/01/17 0230 12/01/17 0621 12/01/17 1527 12/02/17 0356 12/02/17 0926 12/03/17 0309  WBC 6.5  --  6.9  --  6.7 6.9  --  7.3  NEUTROABS 5.6  --   --   --   --   --   --   --   HGB 8.8*   < > 8.7* 7.7* 8.5* 7.4* 7.6* 7.7*  HCT  26.7*   < > 25.9* 23.7* 26.3* 22.2* 23.6* 23.3*  MCV 90.8  --  90.6  --  90.4 90.6  --  92.8  PLT 163  --  145*  --  158 126*  --  113*   < > = values in this interval not displayed.   Basic Metabolic Panel: Recent Labs  Lab 11/30/17 1632 11/30/17 1645 12/01/17 0230 12/02/17 0356 12/02/17 1646 12/03/17 0309  NA 134* 137 138 144  --  147*  K 4.3 4.3 4.1 3.3* 3.6 3.9  CL 108 109 111 111  --  113*  CO2 14*  --  10* 22  --  29  GLUCOSE 113* 107* 92 101*  --  100*  BUN 164* >140* 160* 109*  --  54*  CREATININE 13.10* 14.00* 10.57*  4.72*  --  1.87*  CALCIUM 7.9*  --  7.7* 7.6*  --  7.8*  MG  --   --   --   --  1.3* 1.8  PHOS  --   --   --  3.8  --   --    GFR: Estimated Creatinine Clearance: 36.4 mL/min (A) (by C-G formula based on SCr of 1.87 mg/dL (H)). Liver Function Tests: Recent Labs  Lab 11/30/17 1632 12/02/17 0356  AST 35  --   ALT 34  --   ALKPHOS 40  --   BILITOT 0.8  --   PROT 6.0*  --   ALBUMIN 3.2* 2.6*   No results for input(s): LIPASE, AMYLASE in the last 168 hours. No results for input(s): AMMONIA in the last 168 hours. Coagulation Profile: Recent Labs  Lab 11/30/17 1632 12/01/17 0230 12/01/17 1527 12/02/17 0356 12/03/17 0309  INR >10.00* >10.00* 2.11 1.55 1.36   Cardiac Enzymes: No results for input(s): CKTOTAL, CKMB, CKMBINDEX, TROPONINI in the last 168 hours. BNP (last 3 results) No results for input(s): PROBNP in the last 8760 hours. HbA1C: No results for input(s): HGBA1C in the last 72 hours. CBG: No results for input(s): GLUCAP in the last 168 hours. Lipid Profile: No results for input(s): CHOL, HDL, LDLCALC, TRIG, CHOLHDL, LDLDIRECT in the last 72 hours. Thyroid Function Tests: No results for input(s): TSH, T4TOTAL, FREET4, T3FREE, THYROIDAB in the last 72 hours. Anemia Panel: Recent Labs    12/01/17 1527 12/02/17 1646  VITAMINB12  --  486  FOLATE  --  10.5  FERRITIN 325* 439*  TIBC 227* 185*  IRON 151 61  RETICCTPCT  --  3.3*   Urine analysis:    Component Value Date/Time   COLORURINE YELLOW 11/30/2017 [place] 11/30/2017 1653   LABSPEC 1.013 11/30/2017 1653   PHURINE 5.0 11/30/2017 1653   GLUCOSEU NEGATIVE 11/30/2017 1653   GLUCOSEU NEGATIVE 11/10/2017 1216   HGBUR MODERATE (A) 11/30/2017 1653   BILIRUBINUR NEGATIVE 11/30/2017 1653   KETONESUR NEGATIVE 11/30/2017 1653   PROTEINUR NEGATIVE 11/30/2017 1653   UROBILINOGEN 0.2 11/10/2017 1216   NITRITE NEGATIVE 11/30/2017 1653   LEUKOCYTESUR NEGATIVE 11/30/2017 1653   Sepsis  Labs: @LABRCNTIP (procalcitonin:4,lacticidven:4)  ) Recent Results (from the past 240 hour(s))  Urine culture     Status: None   Collection Time: 11/30/17  4:17 PM  Result Value Ref Range Status   Specimen Description URINE, RANDOM  Final   Special Requests NONE  Final   Culture NO GROWTH  Final   Report Status 12/01/2017 FINAL  Final  Blood Culture (routine x 2)     Status: None (Preliminary  result)   Collection Time: 11/30/17  4:35 PM  Result Value Ref Range Status   Specimen Description BLOOD LEFT ANTECUBITAL  Final   Special Requests   Final    BOTTLES DRAWN AEROBIC AND ANAEROBIC Blood Culture results may not be optimal due to an excessive volume of blood received in culture bottles   Culture NO GROWTH 2 DAYS  Final   Report Status PENDING  Incomplete  Blood Culture (routine x 2)     Status: None (Preliminary result)   Collection Time: 11/30/17  5:22 PM  Result Value Ref Range Status   Specimen Description BLOOD BLOOD LEFT WRIST  Final   Special Requests   Final    Blood Culture adequate volume BOTTLES DRAWN AEROBIC AND ANAEROBIC   Culture NO GROWTH 2 DAYS  Final   Report Status PENDING  Incomplete  C difficile quick scan w PCR reflex     Status: None   Collection Time: 11/30/17  8:50 PM  Result Value Ref Range Status   C Diff antigen NEGATIVE NEGATIVE Final   C Diff toxin NEGATIVE NEGATIVE Final   C Diff interpretation No C. difficile detected.  Final  MRSA PCR Screening     Status: None   Collection Time: 12/01/17  3:19 PM  Result Value Ref Range Status   MRSA by PCR NEGATIVE NEGATIVE Final    Comment:        The GeneXpert MRSA Assay (FDA approved for NASAL specimens only), is one component of a comprehensive MRSA colonization surveillance program. It is not intended to diagnose MRSA infection nor to guide or monitor treatment for MRSA infections.          Radiology Studies: Ct Abdomen Pelvis Wo Contrast  Result Date: 12/02/2017 CLINICAL DATA:   Obstructive uropathy at level of bladder. Lower abdominal pain and hematuria. EXAM: CT ABDOMEN AND PELVIS WITHOUT CONTRAST TECHNIQUE: Multidetector CT imaging of the abdomen and pelvis was performed following the standard protocol without IV contrast. COMPARISON:  Renal ultrasound yesterday. FINDINGS: Lower chest: The lung bases are clear. Mild cardiomegaly. There are coronary artery calcifications. Hepatobiliary: No focal hepatic lesion allowing for lack contrast. Clips in the gallbladder fossa postcholecystectomy. No biliary dilatation. Pancreas: No ductal dilatation or inflammation. Spleen: Normal in size without focal abnormality. Adrenals/Urinary Tract: Low-density left adrenal nodule, partially obscured by motion, however consistent with adenoma. Small low-density right adrenal nodule measuring 14 mm may be an adenoma or myelolipoma. Mild prominence of bilateral renal collecting systems without frank hydronephrosis, right greater than left. Mild symmetric perinephric edema. Cyst in the interpolar left kidney measures approximately 3.5 cm. Cyst in the right kidney measures 17 mm. No urolithiasis. Foley catheter decompressing the urinary bladder. There is diffuse bladder wall thickening. Intravesicular air likely related to Foley catheter. No external vesicular mass effect. Stomach/Bowel: Colonic diverticulosis throughout the colon. No diverticulitis. Normal appendix. No bowel inflammation or obstruction. The stomach is nondistended. Lack of enteric contrast limits detailed bowel assessment. Vascular/Lymphatic: Aortic and branch atherosclerosis. No aneurysm. No bulky adenopathy, lack of contrast limits assessment. Reproductive: Post hysterectomy with prominence of the vaginal cuff. No adnexal mass. Possible right ovary in situ, quiescent. Other: No ascites or free air. No intra-abdominal abscess. Small fat containing umbilical hernia. Musculoskeletal: Multilevel degenerative change throughout the lumbar spine.  There are no acute or suspicious osseous abnormalities. Intramuscular lipoma within right obturator muscle, incidental. IMPRESSION: 1. No urolithiasis. Foley catheter in place with bladder wall thickening. Prominence of both renal collecting systems without frank  hydronephrosis. No evidence of obstructive uropathy or bladder outlet obstruction or obstructive uropathy. 2. Colonic diverticulosis without diverticulitis. 3. Bilateral renal cysts. 4. Incidental findings of left adrenal adenoma right adrenal adenoma versus myelolipoma, and Aortic Atherosclerosis (ICD10-I70.0). Electronically Signed   By: Jeb Levering M.D.   On: 12/02/2017 02:34        Scheduled Meds: . amiodarone  200 mg Oral Daily  . potassium chloride  40 mEq Oral Once  . rosuvastatin  10 mg Oral Daily   Continuous Infusions: . sodium chloride 75 mL/hr at 12/03/17 0034  . sodium chloride       LOS: 3 days    Time spent: 40 minutes    WOODS, Geraldo Docker, MD [facility] Pager [phone]   If 7PM-7AM, please contact night-coverage [email or web address] Password [email or web address] 12/03/2017, 8:09 AM

## 2017-12-03 NOTE — Progress Notes (Signed)
[facility] ROUNDING NOTE   Subjective:   Urinary retention and acute renal failure with the return of 2 L urine on placing foley  Her creatinine has improved with drainage    Objective:  Vital signs in last 24 hours:  Temp:  [98.3 F (36.8 C)-99.8 F (37.7 C)] 99.8 F (37.7 C) (01/10 0930) Pulse Rate:  [65-79] 69 (01/10 0753) Resp:  [17-24] 24 (01/10 0930) BP: (102-146)/(52-72) 132/72 (01/10 0930) SpO2:  [94 %-98 %] 96 % (01/10 0753) Weight:  [229 lb 3.2 oz (104 kg)] 229 lb 3.2 oz (104 kg) (01/10 0325)  Weight change: -14.4 oz (-1.769 kg) Filed Weights   12/01/17 1456 12/02/17 0614 12/03/17 0325  Weight: 233 lb 1.6 oz (105.7 kg) 247 lb 5.7 oz (112.2 kg) 229 lb 3.2 oz (104 kg)    Intake/Output: I/O last 3 completed shifts: In: 2849.5 [P.O.:1682; I.V.:807.5; Blood:360] Out: 5500 [Urine:5500]   Intake/Output this shift:  Total I/O In: 418.8 [I.V.:418.8] Out: 1000 [Urine:1000]  CVS- RRR RS- CTA ABD- BS present soft non-distended EXT- no edema   Basic Metabolic Panel: Recent Labs  Lab 11/30/17 1632 11/30/17 1645 12/01/17 0230 12/02/17 0356 12/02/17 1646 12/03/17 0309  NA 134* 137 138 144  --  147*  K 4.3 4.3 4.1 3.3* 3.6 3.9  CL 108 109 111 111  --  113*  CO2 14*  --  10* 22  --  29  GLUCOSE 113* 107* 92 101*  --  100*  BUN 164* >140* 160* 109*  --  54*  CREATININE 13.10* 14.00* 10.57* 4.72*  --  1.87*  CALCIUM 7.9*  --  7.7* 7.6*  --  7.8*  MG  --   --   --   --  1.3* 1.8  PHOS  --   --   --  3.8  --   --     Liver Function Tests: Recent Labs  Lab 11/30/17 1632 12/02/17 0356  AST 35  --   ALT 34  --   ALKPHOS 40  --   BILITOT 0.8  --   PROT 6.0*  --   ALBUMIN 3.2* 2.6*   No results for input(s): LIPASE, AMYLASE in the last 168 hours. No results for input(s): AMMONIA in the last 168 hours.  CBC: Recent Labs  Lab 11/30/17 1632  12/01/17 0230 12/01/17 0621 12/01/17 1527 12/02/17 0356 12/02/17 0926 12/03/17 0309  WBC 6.5   --  6.9  --  6.7 6.9  --  7.3  NEUTROABS 5.6  --   --   --   --   --   --   --   HGB 8.8*   < > 8.7* 7.7* 8.5* 7.4* 7.6* 7.7*  HCT 26.7*   < > 25.9* 23.7* 26.3* 22.2* 23.6* 23.3*  MCV 90.8  --  90.6  --  90.4 90.6  --  92.8  PLT 163  --  145*  --  158 126*  --  113*   < > = values in this interval not displayed.    Cardiac Enzymes: No results for input(s): CKTOTAL, CKMB, CKMBINDEX, TROPONINI in the last 168 hours.  BNP: Invalid input(s): POCBNP  CBG: No results for input(s): GLUCAP in the last 168 hours.  Microbiology: Results for orders placed or performed during the hospital encounter of 11/30/17  Urine culture     Status: None   Collection Time: 11/30/17  4:17 PM  Result Value Ref Range Status   Specimen Description URINE,  RANDOM  Final   Special Requests NONE  Final   Culture NO GROWTH  Final   Report Status 12/01/2017 FINAL  Final  Blood Culture (routine x 2)     Status: None (Preliminary result)   Collection Time: 11/30/17  4:35 PM  Result Value Ref Range Status   Specimen Description BLOOD LEFT ANTECUBITAL  Final   Special Requests   Final    BOTTLES DRAWN AEROBIC AND ANAEROBIC Blood Culture results may not be optimal due to an excessive volume of blood received in culture bottles   Culture NO GROWTH 2 DAYS  Final   Report Status PENDING  Incomplete  Blood Culture (routine x 2)     Status: None (Preliminary result)   Collection Time: 11/30/17  5:22 PM  Result Value Ref Range Status   Specimen Description BLOOD BLOOD LEFT WRIST  Final   Special Requests   Final    Blood Culture adequate volume BOTTLES DRAWN AEROBIC AND ANAEROBIC   Culture NO GROWTH 2 DAYS  Final   Report Status PENDING  Incomplete  C difficile quick scan w PCR reflex     Status: None   Collection Time: 11/30/17  8:50 PM  Result Value Ref Range Status   C Diff antigen NEGATIVE NEGATIVE Final   C Diff toxin NEGATIVE NEGATIVE Final   C Diff interpretation No C. difficile detected.  Final  MRSA PCR  Screening     Status: None   Collection Time: 12/01/17  3:19 PM  Result Value Ref Range Status   MRSA by PCR NEGATIVE NEGATIVE Final    Comment:        The GeneXpert MRSA Assay (FDA approved for NASAL specimens only), is one component of a comprehensive MRSA colonization surveillance program. It is not intended to diagnose MRSA infection nor to guide or monitor treatment for MRSA infections.     Coagulation Studies: Recent Labs    11/30/17 1632 12/01/17 0230 12/01/17 1527 12/02/17 0356 12/03/17 0309  LABPROT >90.0* >90.0* 23.5* 18.5* 16.7*  INR >10.00* >10.00* 2.11 1.55 1.36    Urinalysis: Recent Labs    11/30/17 1653  COLORURINE YELLOW  LABSPEC 1.013  PHURINE 5.0  GLUCOSEU NEGATIVE  HGBUR MODERATE*  BILIRUBINUR NEGATIVE  KETONESUR NEGATIVE  PROTEINUR NEGATIVE  NITRITE NEGATIVE  LEUKOCYTESUR NEGATIVE      Imaging: Ct Abdomen Pelvis Wo Contrast  Result Date: 12/02/2017 CLINICAL DATA:  Obstructive uropathy at level of bladder. Lower abdominal pain and hematuria. EXAM: CT ABDOMEN AND PELVIS WITHOUT CONTRAST TECHNIQUE: Multidetector CT imaging of the abdomen and pelvis was performed following the standard protocol without IV contrast. COMPARISON:  Renal ultrasound yesterday. FINDINGS: Lower chest: The lung bases are clear. Mild cardiomegaly. There are coronary artery calcifications. Hepatobiliary: No focal hepatic lesion allowing for lack contrast. Clips in the gallbladder fossa postcholecystectomy. No biliary dilatation. Pancreas: No ductal dilatation or inflammation. Spleen: Normal in size without focal abnormality. Adrenals/Urinary Tract: Low-density left adrenal nodule, partially obscured by motion, however consistent with adenoma. Small low-density right adrenal nodule measuring 14 mm may be an adenoma or myelolipoma. Mild prominence of bilateral renal collecting systems without frank hydronephrosis, right greater than left. Mild symmetric perinephric edema. Cyst in  the interpolar left kidney measures approximately 3.5 cm. Cyst in the right kidney measures 17 mm. No urolithiasis. Foley catheter decompressing the urinary bladder. There is diffuse bladder wall thickening. Intravesicular air likely related to Foley catheter. No external vesicular mass effect. Stomach/Bowel: Colonic diverticulosis throughout the colon. No diverticulitis.  Normal appendix. No bowel inflammation or obstruction. The stomach is nondistended. Lack of enteric contrast limits detailed bowel assessment. Vascular/Lymphatic: Aortic and branch atherosclerosis. No aneurysm. No bulky adenopathy, lack of contrast limits assessment. Reproductive: Post hysterectomy with prominence of the vaginal cuff. No adnexal mass. Possible right ovary in situ, quiescent. Other: No ascites or free air. No intra-abdominal abscess. Small fat containing umbilical hernia. Musculoskeletal: Multilevel degenerative change throughout the lumbar spine. There are no acute or suspicious osseous abnormalities. Intramuscular lipoma within right obturator muscle, incidental. IMPRESSION: 1. No urolithiasis. Foley catheter in place with bladder wall thickening. Prominence of both renal collecting systems without frank hydronephrosis. No evidence of obstructive uropathy or bladder outlet obstruction or obstructive uropathy. 2. Colonic diverticulosis without diverticulitis. 3. Bilateral renal cysts. 4. Incidental findings of left adrenal adenoma right adrenal adenoma versus myelolipoma, and Aortic Atherosclerosis (ICD10-I70.0). Electronically Signed   By: Jeb Levering M.D.   On: 12/02/2017 02:34     Medications:   . sodium chloride 75 mL/hr at 12/03/17 0034  . sodium chloride     . amiodarone  200 mg Oral Daily  . potassium chloride  40 mEq Oral Once  . rosuvastatin  10 mg Oral Daily   ondansetron **OR** ondansetron (ZOFRAN) IV  Assessment/ Plan:   Acute kidney injury with some obstructive/  Retention pathology would continue  foley will need urology   Anemia  GI bleed with planned colonoscopy  Metabolic acidosis improved   HTN  Blood pressure soft would not give antihypertensives  Hypernatremia will change to D5W 75cc/hr  CT showed Left Adrenal mass  1.4cm  Will sign off  --- I would let her primary know about the adrenal mass   May need endocrine evaluation     LOS: 3 Patricia Wagner W @TODAY @10 :09 AM

## 2017-12-03 NOTE — Progress Notes (Signed)
Consent for Colonoscopy signed and placed on chart.

## 2017-12-03 NOTE — Consult Note (Signed)
[facility] Consult: 8:25 AM 12/03/2017  LOS: 3 days    Referring Provider: Dr Arnette Felts  Primary Care Physician:  Biagio Borg, MD Primary Gastroenterologist:  Dr. Fuller Plan    Reason for Consultation: Painless hematochezia.     HPI: Patricia Wagner is a 67 y.o. female.  Hx Afib, failed/recurred post multiple CVs, on Coumadin.  CVA 2017.  NICM (06/2017 EF 30 to 35%).  Non-obstructive CAD, cath 11/207.  Cardiogenic shock in setting of persistent A fib 04/2017 CKD stage 3-4. Chronic LE edema.  Suspected OSA.   Thrombocytopenia dating to 2017, platelets 95 in 04/2017.  2001 Colonoscopy.  Scattered tics.  Random bx: unremarkable 02/2011 Colonoscopy.  Avg risk screening.  Scattered pan diverticulosis.  5 mm sessile, sigmoid polyp (TA, no HGD). Pt has not responded to colonoscopy call back letter sent 12/2015.    Treated for E coli UTI 12/18, treated with Keflex.  C/o urinary retention, pelvic discomfort at 12/31 ROV with PMD.  Declined suggested/proffered labs, CT and advise to go to ED. Dr Knute Neu course of Cipro.     Admitted earlier this week with obstructive uropathy (2 liters returned after foley placed), hematuria, AKI, n/v. Coagulopathy (PT >90, INR >10).  Hematuria resolved and AKI improved, GFR 3 >> 32.     She had vomited once at home, emesis was brown but no gross blood.  Hard to say if this was coffee grounds or not.  It only happened once.  She no longer has any nausea.  At arrival she had a pins and needle feeling in her suprapubic region but no abdominal pain, this has resolved.  Since arrival she has been passing bright red blood per rectum.  Several episodes a day but this tapered off, yesterday she had passed a small amount of blood in the last episode was about 7 PM yesterday.  She had no previous history of  bleeding per rectum.  Generally has regular formed, brown stools.   Patient generally has a great appetite.  Does not suffer heartburn.  No dysphagia.  Occasional globus sensation. Hgb baseline 12 to 13.  Hgb since arrival: 8.8 >> 7.7 >> 7.4 >> 1 U PRBCs on 1/9 >> 7.7 today.  recieving 2nd U of blood.  MCV normal.   Platelets 113.   PT/INR 16.7/1.3. Non-contrast abd/pelvic CT: colon diverticulosis.  No clear source for urinary obstruction. Left adrenal adenoma, right adrenal ademona vs myelipoma.  Liver and slpleen unremarkable.    Fm Hx + CAD, her twin sister has ESRD and is on dialysis.  No hx cancers, PUD, anemia, GIB.          Past Medical History:  Diagnosis Date  . Arthritis   . Atrial fibrillation ([place])    a. s/p multiple cardioversions; failed tikosyn/sotalol.  Marland Kitchen CAD in native artery, s/p cardiac cath with non obstructive CAD 10/24/2016  . CHF (congestive heart failure) ([place])   . DIVERTICULITIS, HX OF 07/25/2007  . DIVERTICULOSIS, COLON 07/22/2007  . Dyspnea   . Edema, peripheral    a.  chronic BLE edema, R>L. Prior trauma from dog attack and accident.  Marland Kitchen HYPERLIPIDEMIA 02/03/2008  . Hypersomnia    declines w/u  . Hypertension   . MENOPAUSAL DISORDER 01/09/2011  . Morbid obesity ([place]) 07/22/2007  . NICM (nonischemic cardiomyopathy) ([place]) 10/24/2016  . Raynaud's syndrome 07/22/2007  . Stroke ([place]) 2017  . THYROID NODULE, RIGHT 01/04/2010  . VITAMIN D DEFICIENCY 01/09/2011   Qualifier: Diagnosis of  By: [name], [name]     Past Surgical History:  Procedure Laterality Date  . CARDIAC CATHETERIZATION N/A 10/23/2016   Procedure: Left Heart Cath and Coronary Angiography;  Surgeon: [name], MD;  Location: [facility];  Service: Cardiovascular;  Laterality: N/A;  . CARDIOVERSION N/A 10/31/2016   Procedure: CARDIOVERSION;  Surgeon: [name], MD;  Location: [facility];  Service: Cardiovascular;  Laterality: N/A;  . CARDIOVERSION N/A 11/03/2016   Procedure:  CARDIOVERSION;  Surgeon: [name], MD;  Location: [facility];  Service: Cardiovascular;  Laterality: N/A;  . CARDIOVERSION N/A 11/18/2016   Procedure: CARDIOVERSION;  Surgeon: [name], MD;  Location: [facility] ENDOSCOPY;  Service: Cardiovascular;  Laterality: N/A;  . CARDIOVERSION N/A 03/25/2017   Procedure: CARDIOVERSION;  Surgeon: [name], MD;  Location: [facility] ENDOSCOPY;  Service: Cardiovascular;  Laterality: N/A;  . CARDIOVERSION N/A 05/06/2017   Procedure: CARDIOVERSION;  Surgeon: [name], MD;  Location: [facility] ENDOSCOPY;  Service: Cardiovascular;  Laterality: N/A;  . CARDIOVERSION N/A 05/12/2017   Procedure: CARDIOVERSION;  Surgeon: [name], MD;  Location: [facility] ENDOSCOPY;  Service: Cardiovascular;  Laterality: N/A;  . COLONOSCOPY    . PARTIAL HYSTERECTOMY     1 OVARY LEFT  . POLYPECTOMY    . TEE WITHOUT CARDIOVERSION N/A 10/31/2016   Procedure: TRANSESOPHAGEAL ECHOCARDIOGRAM (TEE);  Surgeon: [name], MD;  Location: [facility] ENDOSCOPY;  Service: Cardiovascular;  Laterality: N/A;  . TEE WITHOUT CARDIOVERSION N/A 03/25/2017   Procedure: TRANSESOPHAGEAL ECHOCARDIOGRAM (TEE);  Surgeon: [name], MD;  Location: [facility] ENDOSCOPY;  Service: Cardiovascular;  Laterality: N/A;    Prior to Admission medications   Medication Sig Start Date End Date Taking? Authorizing Provider  acetaminophen (TYLENOL) 325 MG tablet Take 650 mg by mouth every 6 (six) hours as needed (pain).    Yes [provider]  amiodarone (PACERONE) 200 MG tablet Take 1 tablet (200 mg total) by mouth daily. 05/29/17  Yes Shirley Friar, PA-C  carvedilol (COREG) 3.125 MG tablet Take 1 tablet (3.125 mg total) by mouth 2 (two) times daily. 07/09/17 11/30/17 Yes [name], MD  diclofenac sodium (VOLTAREN) 1 % GEL Apply 4 g topically 4 (four) times daily as needed (As directed for skin). Patient taking differently: Apply 4 g topically 4 (four) times daily as needed (for pain).   10/23/17  Yes Strader, [place], PA-C  furosemide (LASIX) 40 MG tablet Take 20 mg by mouth daily.   Yes [provider]  Investigational - Study Medication Take 1 tablet by mouth 2 (two) times daily. Study name: Galactic HF Study  Additional study details: Omecamtiv Mecarbil or Placebo 09/17/17  Yes Larey Dresser, MD  isosorbide-hydrALAZINE (BIDIL) 20-37.5 MG tablet Take 1 tablet by mouth 3 (three) times daily. Patient taking differently: Take 1 tablet by mouth 2 (two) times daily.  10/20/17  Yes [name], MD  ranolazine (RANEXA) 500 MG 12 hr tablet Take 1 tablet (500 mg total) by mouth 2 (two) times daily. 09/14/17  Yes Bensimhon, Shaune Pascal, MD  rosuvastatin (CRESTOR) 10 MG tablet TAKE 1 TABLET (10 MG TOTAL) BY MOUTH DAILY. 10/07/17  Yes Biagio Borg, MD  sacubitril-valsartan (ENTRESTO) 49-51 MG Take 1 tablet by mouth 2 (two) times daily. 08/19/17  Yes Bensimhon, Shaune Pascal, MD  spironolactone (ALDACTONE) 25 MG tablet Take 0.5 tablets (12.5 mg total) by mouth daily. 10/23/17  Yes Strader, Fransisco Hertz, PA-C  tamsulosin (FLOMAX) 0.4 MG CAPS capsule Take 1 capsule (0.4 mg total) by mouth daily. 11/23/17  Yes Biagio Borg, MD  warfarin (COUMADIN) 3 MG tablet TAKE 1 TABLET BY MOUTH DAILY OR AS DIRECTED BY COUMADIN CLINIC Patient taking differently: Take 3 mg by mouth at bedtime on Sun/Mon/Tues/Thurs/Fri/Sat and 4.5 mg on Wed 08/10/17  Yes [name], Denice Bors, MD  furosemide (LASIX) 20 MG tablet Take 1 tablet (20 mg total) by mouth daily. Patient not taking: Reported on 11/30/2017 10/27/17   Erma Heritage, PA-C    Scheduled Meds: . amiodarone  200 mg Oral Daily  . potassium chloride  40 mEq Oral Once  . rosuvastatin  10 mg Oral Daily   Infusions: . sodium chloride 75 mL/hr at 12/03/17 0034  . sodium chloride    . sodium chloride     PRN Meds: ondansetron **OR** ondansetron (ZOFRAN) IV   Allergies as of 11/30/2017 - Review Complete 11/30/2017  Allergen Reaction Noted  .  Ace inhibitors Palpitations 03/20/2011    Family History  Problem Relation Age of Onset  . Asthma Mother   . Diabetes Father   . Heart disease Father        Died of presumed heart attack - 66s  . Lung disease Sister   . Heart disease Sister        Twin sister has heart issue, unclear what kind  . Thyroid disease Neg Hx     Social History   Socioeconomic History  . Marital status: Single    Spouse name: Not on file  . Number of children: 1  . Years of education: Not on file  . Highest education level: Not on file  Social Needs  . Financial resource strain: Not on file  . Food insecurity - worry: Not on file  . Food insecurity - inability: Not on file  . Transportation needs - medical: Not on file  . Transportation needs - non-medical: Not on file  Occupational History  . Occupation: Academic librarian   Tobacco Use  . Smoking status: Current Every Day Smoker    Packs/day: 0.50    Types: Cigarettes    Last attempt to quit: 10/23/2016    Years since quitting: 1.1  . Smokeless tobacco: Never Used  . Tobacco comment: Smoked for 50 years  Substance and Sexual Activity  . Alcohol use: No    Alcohol/week: 2.4 oz    Types: 4 Cans of beer per week    Frequency: Never    Comment: Intermittent  . Drug use: No  . Sexual activity: Not on file  Other Topics Concern  . Not on file  Social History Narrative  . Not on file    REVIEW OF SYSTEMS: Constitutional: In the weeks leading up to admission, she felt bad.  Kind of weak.  Just generally felt unwell.  Now that she is voiding with the assistance of the Foley, she feels a lot better. ENT: Within the past few weeks she had a single episode of clearing some small amount of blood from her throat, at the same time she blew her nose and  there was a small but this was not a significant episode of epistaxis. Pulm: Patient's breathing is fine today.  No cough. CV:  Gained 9# in last several weeks PTA.  Reviewed the weight measurements for this  patient, I do not think they are accurate.  Measurements 250 # >> 233 # >> 247 # >> 229.   GU:  Per HPI.  Hematuria has resolved. GI:  Per HPI Heme:  Per HPI.  Patient generally does not suffer from unusual bleeding or bruising.  She says she was anemic at birth.  She is never required iron supplementation or previous blood transfusions. Transfusions:  Per HPI Neuro: Her stroke left her with slightly slow speech and right upper extremity weakness.  No headaches, no peripheral tingling or numbness Derm:  No itching, no rash or sores.  Endocrine:  No sweats or chills.  No polyuria or dysuria Immunization: Reviewed.  She is current on Pneumovax and flu shot. Travel:  None beyond local counties in last few months.    PHYSICAL EXAM: Vital signs in last 24 hours: Vitals:   12/03/17 0325 12/03/17 0753  BP: 140/63 (!) 146/71  Pulse: 79 69  Resp: 20 (!) 22  Temp: 98.9 F (37.2 C) 99.1 F (37.3 C)  SpO2: 95% 96%   Wt Readings from Last 3 Encounters:  12/03/17 104 kg (229 lb 3.2 oz)  11/30/17 112.3 kg (247 lb 8 oz)  11/23/17 110.3 kg (243 lb 1.9 oz)    General: Pleasant, overweight AAF.  She is comfortable and fully alert Head: No facial asymmetry.  No facial edema.  No signs of head trauma. Eyes: No scleral icterus.  No conjunctival pallor.  EOMI. Ears: Not HOH Nose: No discharge or congestion. Mouth: Oropharynx moist and clear.  Tongue midline.  Good dentition. Neck: No JVD.  No masses.  No thyromegaly. Lungs: Clear bilaterally.  No cough or labored breathing.  Good breath sounds. Heart: Currently in sinus rhythm in the 70s.  No MRG.  S1, S2 present. Abdomen: Obese.  Soft.  Nontender.  Nondistended.  Bowel sounds normoactive.  Or masses.  No hernias..   Rectal: Good rectal tone.  No palpable or visible masses.  Exam glove with dark blood. Musc/Skeltl: No joint redness, swelling or significant deformities. Extremities: Nonpitting edema of the right foot. Neurologic: Alert.  Oriented  x3.  Moves all 4 limbs, strength not tested.  No gross deficits.  No tremors. Skin: No rashes, sores, suspicious lesions. Tattoos: None Nodes: No cervical adenopathy Psych: Pleasant, cooperative, calm.  Good historian.  Intake/Output from previous day: 01/09 0701 - 01/10 0700 In: 2002.5 [P.O.:1460; I.V.:182.5; Blood:360] Out: 2450 [Urine:2450] Intake/Output this shift: Total I/O In: -  Out: 1000 [Urine:1000]  LAB RESULTS: Recent Labs    12/01/17 1527 12/02/17 0356 12/02/17 0926 12/03/17 0309  WBC 6.7 6.9  --  7.3  HGB 8.5* 7.4* 7.6* 7.7*  HCT 26.3* 22.2* 23.6* 23.3*  PLT 158 126*  --  113*   BMET Lab Results  Component Value Date   NA 147 (H) 12/03/2017   NA 144 12/02/2017   NA 138 12/01/2017   K 3.9 12/03/2017   K 3.6 12/02/2017   K 3.3 (L) 12/02/2017   CL 113 (H) 12/03/2017   CL 111 12/02/2017   CL 111 12/01/2017   CO2 29 12/03/2017   CO2 22 12/02/2017   CO2 10 (L) 12/01/2017   GLUCOSE 100 (H) 12/03/2017   GLUCOSE 101 (H) 12/02/2017   GLUCOSE 92 12/01/2017  BUN 54 (H) 12/03/2017   BUN 109 (H) 12/02/2017   BUN 160 (H) 12/01/2017   CREATININE 1.87 (H) 12/03/2017   CREATININE 4.72 (H) 12/02/2017   CREATININE 10.57 (H) 12/01/2017   CALCIUM 7.8 (L) 12/03/2017   CALCIUM 7.6 (L) 12/02/2017   CALCIUM 7.7 (L) 12/01/2017   LFT Recent Labs    11/30/17 1632 12/02/17 0356  PROT 6.0*  --   ALBUMIN 3.2* 2.6*  AST 35  --   ALT 34  --   ALKPHOS 40  --   BILITOT 0.8  --    PT/INR Lab Results  Component Value Date   INR 1.36 12/03/2017   INR 1.55 12/02/2017   INR 2.11 12/01/2017   Hepatitis Panel No results for input(s): HEPBSAG, HCVAB, HEPAIGM, HEPBIGM in the last 72 hours. C-Diff No components found for: CDIFF Lipase  No results found for: LIPASE  Drugs of Abuse  No results found for: LABOPIA, COCAINSCRNUR, LABBENZ, AMPHETMU, THCU, LABBARB   RADIOLOGY STUDIES: Ct Abdomen Pelvis Wo Contrast  Result Date: 12/02/2017 CLINICAL DATA:  Obstructive  uropathy at level of bladder. Lower abdominal pain and hematuria. EXAM: CT ABDOMEN AND PELVIS WITHOUT CONTRAST TECHNIQUE: Multidetector CT imaging of the abdomen and pelvis was performed following the standard protocol without IV contrast. COMPARISON:  Renal ultrasound yesterday. FINDINGS: Lower chest: The lung bases are clear. Mild cardiomegaly. There are coronary artery calcifications. Hepatobiliary: No focal hepatic lesion allowing for lack contrast. Clips in the gallbladder fossa postcholecystectomy. No biliary dilatation. Pancreas: No ductal dilatation or inflammation. Spleen: Normal in size without focal abnormality. Adrenals/Urinary Tract: Low-density left adrenal nodule, partially obscured by motion, however consistent with adenoma. Small low-density right adrenal nodule measuring 14 mm may be an adenoma or myelolipoma. Mild prominence of bilateral renal collecting systems without frank hydronephrosis, right greater than left. Mild symmetric perinephric edema. Cyst in the interpolar left kidney measures approximately 3.5 cm. Cyst in the right kidney measures 17 mm. No urolithiasis. Foley catheter decompressing the urinary bladder. There is diffuse bladder wall thickening. Intravesicular air likely related to Foley catheter. No external vesicular mass effect. Stomach/Bowel: Colonic diverticulosis throughout the colon. No diverticulitis. Normal appendix. No bowel inflammation or obstruction. The stomach is nondistended. Lack of enteric contrast limits detailed bowel assessment. Vascular/Lymphatic: Aortic and branch atherosclerosis. No aneurysm. No bulky adenopathy, lack of contrast limits assessment. Reproductive: Post hysterectomy with prominence of the vaginal cuff. No adnexal mass. Possible right ovary in situ, quiescent. Other: No ascites or free air. No intra-abdominal abscess. Small fat containing umbilical hernia. Musculoskeletal: Multilevel degenerative change throughout the lumbar spine. There are no  acute or suspicious osseous abnormalities. Intramuscular lipoma within right obturator muscle, incidental. IMPRESSION: 1. No urolithiasis. Foley catheter in place with bladder wall thickening. Prominence of both renal collecting systems without frank hydronephrosis. No evidence of obstructive uropathy or bladder outlet obstruction or obstructive uropathy. 2. Colonic diverticulosis without diverticulitis. 3. Bilateral renal cysts. 4. Incidental findings of left adrenal adenoma right adrenal adenoma versus myelolipoma, and Aortic Atherosclerosis (ICD10-I70.0). Electronically Signed   By: Jeb Levering M.D.   On: 12/02/2017 02:34     IMPRESSION:   *   Painless hematochezia in setting of INR >10.  ? Diverticular bleed?.  Bleeding has slowed and last episode was PM 1/9.  On exam today has dark, ?old, blood.   Single episode brown emesis PTA, non since.   9 months overdue for surveillance colonoscopy (TA polyp 02/2011).  Low suspicion for neoplasm causing GIB.    *  Blood loss anemia. Receiving 2nd PRBC now.     *  Obstructive uropathy with hematuria and AKI  *  Iatrogennic coagulopathy, resolved.  Chronic Coumadin on hold.  Indication: Afib.   INR 1.3 today.  Set up for A fib ablation 12/11/2017.  However I see that the pre-operative cardiac CT has been canceled so not clear that she is actually going to undergo the ablation, it may be that it is going to be delayed.  *  Non-critical thrombocytopenia dating to 2017.  No cirrhosis or splenomegaly.      PLAN:     *  D/w Dr Henrene Pastor: plan colonoscopy tomorrow.    Azucena Freed  12/03/2017, 8:25 AM Pager: [phone]  GI ATTENDING  History, laboratories, x-rays, prior colonoscopy reports reviewed. Patient personally seen and examined. Agree with comprehensive consultation note as outlined above. 67 year old with multiple significant medical problems. Admitted with obstructive uropathy. Markedly elevated prothrombin time on admission. She is on  chronic Coumadin for chronic atrial fibrillation. Subsequently developed recurrent rectal bleeding without hemodynamic compromise. Bleeding has stopped as the coagulopathy has corrected. She has had several colonoscopies in the past. Most recently 2012 with diminutive adenoma and significant pandiverticulosis. Suspect recurrent bleed is secondary to diverticular source in the face of over anticoagulation. I do think that she should undergo colonoscopy at this time to evaluate the bleeding, provide adenomatous polyp surveillance, and prognosticate as she may need to resume long-term anticoagulation therapy. The patient is high risk given her comorbidities.The nature of the procedure, as well as the risks, benefits, and alternatives were carefully and thoroughly reviewed with the patient. Ample time for discussion and questions allowed. The patient understood, was satisfied, and agreed to proceed. She will prep today with the examination planned for tomorrow at noon. Thank you.  Docia Chuck. Geri [place]in[place]., M.D. [facility] Division of Gastroenterology

## 2017-12-04 ENCOUNTER — Inpatient Hospital Stay (HOSPITAL_COMMUNITY): Payer: BLUE CROSS/BLUE SHIELD | Admitting: Anesthesiology

## 2017-12-04 ENCOUNTER — Encounter (HOSPITAL_COMMUNITY): Payer: Self-pay | Admitting: Internal Medicine

## 2017-12-04 ENCOUNTER — Encounter (HOSPITAL_COMMUNITY)
Admission: EM | Disposition: A | Discharge status: Home Health | Payer: Self-pay | Source: Home / Self Care | Attending: Internal Medicine

## 2017-12-04 ENCOUNTER — Other Ambulatory Visit: Payer: Self-pay | Admitting: Cardiology

## 2017-12-04 ENCOUNTER — Ambulatory Visit (HOSPITAL_COMMUNITY): Payer: BLUE CROSS/BLUE SHIELD

## 2017-12-04 ENCOUNTER — Other Ambulatory Visit (HOSPITAL_COMMUNITY): Payer: Self-pay | Admitting: Student

## 2017-12-04 DIAGNOSIS — K5730 Diverticulosis of large intestine without perforation or abscess without bleeding: Secondary | ICD-10-CM

## 2017-12-04 HISTORY — PX: COLONOSCOPY: SHX5424

## 2017-12-04 LAB — BPAM RBC
BLOOD PRODUCT EXPIRATION DATE: 201902022359
Blood Product Expiration Date: 201902022359
ISSUE DATE / TIME: 201901100907
ISSUE DATE / TIME: 201901092109
Unit Type and Rh: 6200
Unit Type and Rh: 8400

## 2017-12-04 LAB — TYPE AND SCREEN
ABO/RH(D): AB POS
Antibody Screen: NEGATIVE
Unit division: 0
Unit division: 0

## 2017-12-04 LAB — BASIC METABOLIC PANEL
Anion gap: 8 (ref 5–15)
BUN: 27 mg/dL — AB (ref 6–20)
CHLORIDE: 112 mmol/L — AB (ref 101–111)
CO2: 28 mmol/L (ref 22–32)
Calcium: 8.4 mg/dL — ABNORMAL LOW (ref 8.9–10.3)
Creatinine, Ser: 1.41 mg/dL — ABNORMAL HIGH (ref 0.44–1.00)
GFR calc Af Amer: 45 mL/min — ABNORMAL LOW (ref >60)
GFR calc non Af Amer: 38 mL/min — ABNORMAL LOW (ref >60)
GLUCOSE: 90 mg/dL (ref 65–99)
POTASSIUM: 3.7 mmol/L (ref 3.5–5.1)
Sodium: 148 mmol/L — ABNORMAL HIGH (ref 135–145)

## 2017-12-04 LAB — PROTIME-INR
INR: 1.27
Prothrombin Time: 15.8 seconds — ABNORMAL HIGH (ref 11.4–15.2)

## 2017-12-04 LAB — CBC
HEMATOCRIT: 30 % — AB (ref 36.0–46.0)
HEMOGLOBIN: 9.5 g/dL — AB (ref 12.0–15.0)
MCH: 29.6 pg (ref 26.0–34.0)
MCHC: 31.7 g/dL (ref 30.0–36.0)
MCV: 93.5 fL (ref 78.0–100.0)
Platelets: 120 10*3/uL — ABNORMAL LOW (ref 150–400)
RBC: 3.21 MIL/uL — ABNORMAL LOW (ref 3.87–5.11)
RDW: 15.8 % — ABNORMAL HIGH (ref 11.5–15.5)
WBC: 8.7 10*3/uL (ref 4.0–10.5)

## 2017-12-04 LAB — MAGNESIUM: Magnesium: 1.4 mg/dL — ABNORMAL LOW (ref 1.7–2.4)

## 2017-12-04 SURGERY — COLONOSCOPY
Anesthesia: Monitor Anesthesia Care

## 2017-12-04 MED ORDER — MAGNESIUM SULFATE 50 % IJ SOLN
3.0000 g | Freq: Once | INTRAVENOUS | Status: AC
  Administered 2017-12-04: 3 g via INTRAVENOUS
  Filled 2017-12-04: qty 6

## 2017-12-04 MED ORDER — WARFARIN SODIUM 5 MG PO TABS
5.0000 mg | ORAL_TABLET | Freq: Once | ORAL | Status: AC
  Administered 2017-12-04: 5 mg via ORAL
  Filled 2017-12-04: qty 1

## 2017-12-04 MED ORDER — MIDAZOLAM HCL 2 MG/2ML IJ SOLN: 0.5000 mg | Freq: Once | INTRAMUSCULAR | Status: DC | PRN

## 2017-12-04 MED ORDER — HEPARIN (PORCINE) IN NACL 100-0.45 UNIT/ML-% IJ SOLN
1450.0000 [IU]/h | INTRAMUSCULAR | Status: DC
  Administered 2017-12-04: 1150 [IU]/h via INTRAVENOUS
  Administered 2017-12-05: 1350 [IU]/h via INTRAVENOUS
  Administered 2017-12-06: 1300 [IU]/h via INTRAVENOUS
  Administered 2017-12-08: 1400 [IU]/h via INTRAVENOUS
  Filled 2017-12-04 (×5): qty 250

## 2017-12-04 MED ORDER — CARVEDILOL 3.125 MG PO TABS
3.1250 mg | ORAL_TABLET | Freq: Two times a day (BID) | ORAL | Status: DC
  Administered 2017-12-04 – 2017-12-08 (×8): 3.125 mg via ORAL
  Filled 2017-12-04 (×8): qty 1

## 2017-12-04 MED ORDER — PROPOFOL 10 MG/ML IV BOLUS
INTRAVENOUS | Status: DC | PRN
  Administered 2017-12-04 (×2): 10 mg via INTRAVENOUS
  Administered 2017-12-04: 20 mg via INTRAVENOUS
  Administered 2017-12-04: 10 mg via INTRAVENOUS

## 2017-12-04 MED ORDER — LACTATED RINGERS IV SOLN
INTRAVENOUS | Status: DC | PRN
  Administered 2017-12-04: 13:00:00 via INTRAVENOUS

## 2017-12-04 MED ORDER — MEPERIDINE HCL 25 MG/ML IJ SOLN: 6.2500 mg | INTRAMUSCULAR | Status: DC | PRN

## 2017-12-04 MED ORDER — DEXTROSE 5 % IV SOLN: INTRAVENOUS | Status: DC

## 2017-12-04 MED ORDER — ONDANSETRON HCL 4 MG/2ML IJ SOLN
INTRAMUSCULAR | Status: DC | PRN
  Administered 2017-12-04: 4 mg via INTRAVENOUS

## 2017-12-04 MED ORDER — WARFARIN - PHARMACIST DOSING INPATIENT
Freq: Every day | Status: DC
  Administered 2017-12-04: 20:00:00

## 2017-12-04 MED ORDER — FENTANYL CITRATE (PF) 100 MCG/2ML IJ SOLN: 25.0000 ug | INTRAMUSCULAR | Status: DC | PRN

## 2017-12-04 MED ORDER — LACTATED RINGERS IV SOLN
INTRAVENOUS | Status: AC | PRN
  Administered 2017-12-04: 1000 mL via INTRAVENOUS

## 2017-12-04 MED ORDER — PROPOFOL 500 MG/50ML IV EMUL
INTRAVENOUS | Status: DC | PRN
  Administered 2017-12-04: 100 ug/kg/min via INTRAVENOUS

## 2017-12-04 MED ORDER — CARVEDILOL 3.125 MG PO TABS: 3.1250 mg | ORAL_TABLET | Freq: Two times a day (BID) | ORAL | Status: DC

## 2017-12-04 MED ORDER — PROMETHAZINE HCL 25 MG/ML IJ SOLN: 6.2500 mg | INTRAMUSCULAR | Status: DC | PRN

## 2017-12-04 MED ORDER — PHENYLEPHRINE HCL 10 MG/ML IJ SOLN
INTRAMUSCULAR | Status: DC | PRN
  Administered 2017-12-04: 80 ug via INTRAVENOUS

## 2017-12-04 MED ORDER — POTASSIUM CHLORIDE CRYS ER 20 MEQ PO TBCR
40.0000 meq | EXTENDED_RELEASE_TABLET | Freq: Once | ORAL | Status: AC
  Administered 2017-12-04: 40 meq via ORAL
  Filled 2017-12-04: qty 2

## 2017-12-04 NOTE — Interval H&P Note (Signed)
History and Physical Interval Note:  12/04/2017 12:17 PM  [name]  has presented today for surgery, with the diagnosis of painless hematochezia.  adenomatous colon polyp 2012  The various methods of treatment have been discussed with the patient and family. After consideration of risks, benefits and other options for treatment, the patient has consented to  Procedure(s): COLONOSCOPY (N/A) as a surgical intervention .  The patient's history has been reviewed, patient examined, no change in status, stable for surgery.  I have reviewed the patient's chart and labs.  Questions were answered to the patient's satisfaction.     Patricia Wagner

## 2017-12-04 NOTE — Progress Notes (Addendum)
ANTICOAGULATION CONSULT NOTE - Initial Consult  Pharmacy Consult for heparin and warfarin Indication: atrial fibrillation  Allergies  Allergen Reactions  . Ace Inhibitors Palpitations    Patient Measurements: Height: 5\' 5"  (165.1 cm) Weight: 227 lb 14.4 oz (103.4 kg) IBW/kg (Calculated) : 57 Heparin Dosing Weight: 81  Vital Signs: Temp: 98.4 F (36.9 C) (01/11 1400) Temp Source: Oral (01/11 1400) BP: 109/61 (01/11 1844) Pulse Rate: 62 (01/11 1400)  Labs: Recent Labs    12/02/17 0356  12/03/17 0309 12/03/17 1801 12/04/17 0224  HGB 7.4*   < > 7.7* 9.8* 9.5*  HCT 22.2*   < > 23.3* 31.3* 30.0*  PLT 126*  --  113* 116* 120*  LABPROT 18.5*  --  16.7*  --  15.8*  INR 1.55  --  1.36  --  1.27  CREATININE 4.72*  --  1.87*  --  1.41*   < > = values in this interval not displayed.    Estimated Creatinine Clearance: 48.1 mL/min (A) (by C-G formula based on SCr of 1.41 mg/dL (H)).   Medical History: Past Medical History:  Diagnosis Date  . Arthritis   . Atrial fibrillation ([place])    a. s/p multiple cardioversions; failed tikosyn/sotalol.  Marland Kitchen CAD in native artery, s/p cardiac cath with non obstructive CAD 10/24/2016  . CHF (congestive heart failure) ([place])   . DIVERTICULITIS, HX OF 07/25/2007  . DIVERTICULOSIS, COLON 07/22/2007  . Dyspnea   . Edema, peripheral    a. chronic BLE edema, R>L. Prior trauma from dog attack and accident.  Marland Kitchen HYPERLIPIDEMIA 02/03/2008  . Hypersomnia    declines w/u  . Hypertension   . MENOPAUSAL DISORDER 01/09/2011  . Morbid obesity ([place]) 07/22/2007  . NICM (nonischemic cardiomyopathy) ([place]) 10/24/2016  . Raynaud's syndrome 07/22/2007  . Stroke ([place]) 2017  . THYROID NODULE, RIGHT 01/04/2010  . VITAMIN D DEFICIENCY 01/09/2011   Qualifier: Diagnosis of  By: [name], [name]     Medications:  Medications Prior to Admission  Medication Sig Dispense Refill Last Dose  . acetaminophen (TYLENOL) 325 MG tablet Take 650 mg by mouth every 6 (six) hours as  needed (pain).    PRN at PRN  . carvedilol (COREG) 3.125 MG tablet Take 1 tablet (3.125 mg total) by mouth 2 (two) times daily. 180 tablet 3 11/30/2017 at 1000  . diclofenac sodium (VOLTAREN) 1 % GEL Apply 4 g topically 4 (four) times daily as needed (As directed for skin). (Patient taking differently: Apply 4 g topically 4 (four) times daily as needed (for pain). ) 100 g 0 PRN at PRN  . furosemide (LASIX) 40 MG tablet Take 20 mg by mouth daily.   11/30/2017 at Unknown time  . Investigational - Study Medication Take 1 tablet by mouth 2 (two) times daily. Study name: Galactic HF Study  Additional study details: Omecamtiv Mecarbil or Placebo 1 each PRN 11/30/2017 at am  . isosorbide-hydrALAZINE (BIDIL) 20-37.5 MG tablet Take 1 tablet by mouth 3 (three) times daily. (Patient taking differently: Take 1 tablet by mouth 2 (two) times daily. ) 180 tablet 11 11/30/2017 at Unknown time  . ranolazine (RANEXA) 500 MG 12 hr tablet Take 1 tablet (500 mg total) by mouth 2 (two) times daily. 60 tablet 5 11/30/2017 at am  . rosuvastatin (CRESTOR) 10 MG tablet TAKE 1 TABLET (10 MG TOTAL) BY MOUTH DAILY. 90 tablet 2 11/30/2017 at Unknown time  . sacubitril-valsartan (ENTRESTO) 49-51 MG Take 1 tablet by mouth 2 (two) times daily.  60 tablet 3 11/30/2017 at am  . spironolactone (ALDACTONE) 25 MG tablet Take 0.5 tablets (12.5 mg total) by mouth daily. 45 tablet 3 11/30/2017 at Unknown time  . tamsulosin (FLOMAX) 0.4 MG CAPS capsule Take 1 capsule (0.4 mg total) by mouth daily. 30 capsule 1 11/30/2017 at am  . furosemide (LASIX) 20 MG tablet Take 1 tablet (20 mg total) by mouth daily. (Patient not taking: Reported on 11/30/2017)   Not Taking at Unknown time    Assessment: 67 y/o female admitted 11/30/2017 with obstructive uropathy, hematuria, AKI and N/V. She takes warfarin PTA for Afib and hx stroke. Also had BRBPR s/p colonoscopy today and presumed self-limited diverticular bleed. Admit INR >10 and was reversed with two vitamin K 5 mg doses.  Pharmacy consulted to resume warfarin and bridge with IV heparin.  INR down to 1.27 today s/p vitamin K. No other bleeding noted, Hgb stable at 9.5, platelets down to 120.  PTA warfarin regimen: 3 mg daily except 4.5 mg Wed per warfarin clinic  Goal of Therapy:  INR 2-3 Heparin level 0.3-0.5 units/ml due to recent bleeding Monitor platelets by anticoagulation protocol: Yes   Plan:  Begin heparin drip at 1150 units/hr with no bolus Warfarin 5 mg PO tonight 8 hr heparin level Daily heparin level, CBC, INR Monitor for s/sx of bleeding   Renold Genta, PharmD, BCPS Clinical Pharmacist Phone for today - [place] - [phone] 12/04/2017 7:38 PM

## 2017-12-04 NOTE — Transfer of Care (Signed)
Immediate Anesthesia Transfer of Care Note  Patient: Patricia Wagner  Procedure(s) Performed: COLONOSCOPY (N/A )  Patient Location: PACU  Anesthesia Type:MAC  Level of Consciousness: awake, alert , oriented and patient cooperative  Airway & Oxygen Therapy: Patient Spontanous Breathing  Post-op Assessment: Report given to RN and Post -op Vital signs reviewed and stable  Post vital signs: Reviewed and stable  Last Vitals:  Vitals:   12/04/17 1347 12/04/17 1349  BP:  129/71  Pulse:    Resp: 17   Temp: 36.8 C   SpO2:      Last Pain:  Vitals:   12/04/17 1347  TempSrc: Oral  PainSc:       Patients Stated Pain Goal: 0 (13/[birth year] 7846)  Complications: No apparent anesthesia complications

## 2017-12-04 NOTE — Progress Notes (Signed)
Pt had another run of HR in the 170s, asymptomatic, BP 109/61. I notified Dr. Sherral Hammers. I questioned why her home meds weren't reordered. He stated he would look into it. Pt stable HR back down in the 80s NSR

## 2017-12-04 NOTE — Anesthesia Procedure Notes (Signed)
Procedure Name: MAC Date/Time: 12/04/2017 1:09 PM Performed by: White, Amedeo Plenty, CRNA Pre-anesthesia Checklist: Patient identified, Emergency Drugs available, Suction available and Patient being monitored Patient Re-evaluated:Patient Re-evaluated prior to induction Oxygen Delivery Method: Simple face mask

## 2017-12-04 NOTE — Progress Notes (Signed)
Pt has a run of rapid afib this evening as she was eating her dinner. She was asymptomatic, BP stable. She stated this is a frequent occurrence for her. I notified Dr. Sherral Hammers of the above. Pt had concerns feeling like the GI MD told her she could go home today. I mentioned this to Dr. Sherral Hammers and he stated he was not going to discharge her today. I relayed this information to patient

## 2017-12-04 NOTE — Op Note (Signed)
[facility] Patient Name: Patricia Wagner Procedure Date : 12/04/2017 MRN: [id number] Attending MD: Docia Chuck. Henrene Pastor , MD Date of Birth: 05-29-1951 CSN: [id number] Age: 67 Admit Type: Inpatient Procedure:                Colonoscopy Indications:              Hematochezia. Prior history of adenomatous polyps Providers:                Docia Chuck. Henrene Pastor, MD, Angus Seller, William Dalton,                            Technician Referring MD:             [facility] Medicines:                Monitored Anesthesia Care Complications:            No immediate complications. Estimated blood loss:                            None. Estimated Blood Loss:     Estimated blood loss: none. Procedure:                Pre-Anesthesia Assessment:                           - Prior to the procedure, a History and Physical                            was performed, and patient medications and                            allergies were reviewed. The patient's tolerance of                            previous anesthesia was also reviewed. The risks                            and benefits of the procedure and the sedation                            options and risks were discussed with the patient.                            All questions were answered, and informed consent                            was obtained. Prior Anticoagulants: The patient has                            taken Coumadin (warfarin), last dose was 4 days                            prior to procedure. ASA Grade Assessment: III - A  patient with severe systemic disease. After                            reviewing the risks and benefits, the patient was                            deemed in satisfactory condition to undergo the                            procedure.                           After obtaining informed consent, the colonoscope                            was passed under direct vision. Throughout the                            procedure, the patient's blood pressure, pulse, and                            oxygen saturations were monitored continuously. The                            EC-[id number] ([id number]) scope was introduced through                            the anus and advanced to the the cecum, identified                            by appendiceal orifice and ileocecal valve. The                            terminal ileum, ileocecal valve, appendiceal                            orifice, and rectum were photographed. The quality                            of the bowel preparation was adequate. The                            colonoscopy was performed without difficulty. The                            patient tolerated the procedure well. The bowel                            preparation used was MoviPrep. Scope In: 1:18:47 PM Scope Out: 1:40:01 PM Scope Withdrawal Time: 0 hours 9 minutes 51 seconds  Total Procedure Duration: 0 hours 21 minutes 14 seconds  Findings:      The terminal ileum appeared normal.      Multiple small and large-mouthed diverticula were found in the entire       colon.  The exam was otherwise without abnormality on direct and retroflexion       views. There is no blood in the colon or small bowel. Impression:               - The examined portion of the ileum was normal.                           - Diverticulosis in the entire examined colon.                            Presumed self-limited diverticular bleed.                           - The examination was otherwise normal on direct                            and retroflexion views.                           - No specimens collected. Moderate Sedation:      none Recommendation:           - Repeat colonoscopy in 5-10 years for                            surveillance, with Dr. Fuller Plan, if medically fit.                           - Resume Coumadin (warfarin) okay per GI.                           - No routine  outpatient GI follow-up required. GI                            follow-up as needed with Dr. Fuller Plan.                           - Resume previous diet.                           Will sign off. Procedure Code(s):        --- Professional ---                           [id number], Colonoscopy, flexible; diagnostic, including                            collection of specimen(s) by brushing or washing,                            when performed (separate procedure) Diagnosis Code(s):        --- Professional ---                           K92.1, Melena (includes Hematochezia)  K57.30, Diverticulosis of large intestine without                            perforation or abscess without bleeding CPT copyright 2016 American Medical Association. All rights reserved. The codes documented in this report are preliminary and upon coder review may  be revised to meet current compliance requirements. Docia Chuck. Henrene Pastor, MD 12/04/2017 1:50:55 PM This report has been signed electronically. Number of Addenda: 0

## 2017-12-04 NOTE — Anesthesia Preprocedure Evaluation (Addendum)
Anesthesia Evaluation  Patient identified by MRN, date of birth, ID band Patient awake    Reviewed: Allergy & Precautions, NPO status , Patient's Chart, lab work & pertinent test results, reviewed documented beta blocker date and time   History of Anesthesia Complications Negative for: history of anesthetic complications  Airway Mallampati: I  TM Distance: >3 FB Neck ROM: Full    Dental  (+) Dental Advisory Given   Pulmonary Current Smoker,    breath sounds clear to auscultation       Cardiovascular hypertension, Pt. on medications and Pt. on home beta blockers (-) angina+ CAD (non-obstructive by cath)  Past MI: non-obstructive ASCADz.  + dysrhythmias Atrial Fibrillation  Rhythm:Regular Rate:Normal  '18 ECHO: EF 30% to 35%. Diffuse hypokinesis. - Mitral valve: There was mild to moderate regurgitation. - Left atrium: The atrium was severely dilated. - Right ventricle: The cavity size was mildly dilated. Wall   thickness was normal. Systolic function was moderately reduced. - Right atrium: The atrium was moderately dilated   Neuro/Psych    GI/Hepatic negative GI ROS, Neg liver ROS,   Endo/Other  Morbid obesity  Renal/GU Renal InsufficiencyRenal disease (creat 1.41)     Musculoskeletal  (+) Arthritis , Osteoarthritis,    Abdominal (+) + obese,   Peds  Hematology  (+) Blood dyscrasia (Hb 9.5, plt 120k), anemia , Coumadin: INR 1.27   Anesthesia Other Findings   Reproductive/Obstetrics                            Anesthesia Physical Anesthesia Plan  ASA: III  Anesthesia Plan: MAC   Post-op Pain Management:    Induction:   PONV Risk Score and Plan: 1 and Ondansetron and Treatment may vary due to age or medical condition  Airway Management Planned: Natural Airway and Nasal Cannula  Additional Equipment:   Intra-op Plan:   Post-operative Plan:   Informed Consent: I have reviewed  the patients History and Physical, chart, labs and discussed the procedure including the risks, benefits and alternatives for the proposed anesthesia with the patient or authorized representative who has indicated his/her understanding and acceptance.   Dental advisory given  Plan Discussed with: CRNA and Surgeon  Anesthesia Plan Comments: (Plan routine monitors, MAC)       Anesthesia Quick Evaluation

## 2017-12-04 NOTE — Anesthesia Postprocedure Evaluation (Signed)
Anesthesia Post Note  Patient: Patricia Wagner  Procedure(s) Performed: COLONOSCOPY (N/A )     Patient location during evaluation: Endoscopy Anesthesia Type: MAC Level of consciousness: awake and alert, patient cooperative and oriented Pain management: pain level controlled Vital Signs Assessment: post-procedure vital signs reviewed and stable Respiratory status: spontaneous breathing, nonlabored ventilation and respiratory function stable Cardiovascular status: blood pressure returned to baseline and stable Postop Assessment: no apparent nausea or vomiting Anesthetic complications: no    Last Vitals:  Vitals:   12/04/17 1349 12/04/17 1400  BP: 129/71 (!) 144/60  Pulse:  62  Resp:  19  Temp:    SpO2:  95%    Last Pain:  Vitals:   12/04/17 1347  TempSrc: Oral  PainSc:                  Jadah Bobak,E. Daksha Koone

## 2017-12-04 NOTE — Progress Notes (Signed)
PROGRESS NOTE    Patricia Wagner  UJW:[id number] DOB: June 07, 1951 DOA: 11/30/2017 PCP: Biagio Borg, MD   Brief Narrative:  67 y.o. BF PMHx CVA, A-Fib on coumadin, Chronic Systolic CHF/NICM,  A. Fib S/P multiple failed DCCV, CAD, HTN,HLD, Menopausal DO,  Arthritis, DIVERTICULITIS, DIVERTICULOSIS, Morbid Obesity, Raynaud's syndrome, THYROID NODULE, RIGHT  Presented with over a week of worsening inability to urinate.  She reported dribbling only, with the inability to fully empty her bladder, associated w/ suprapubic pain.   In the ED a foley returned over 2 liters of urine.  She was also noted to have AKI with creatinine over 14.     Subjective: 1/11 patient was off floor for procedure. Received call from [place] that patient had~1 minute run of A. fib with RVR. Patient had not been started on her home medications post procedure.    Assessment & Plan:   Principal Problem:   Acute renal failure superimposed on chronic kidney disease ([place]) Active Problems:   Essential hypertension   Raynaud's syndrome   CAD in native artery, s/p cardiac cath with non obstructive CAD   CHF (congestive heart failure) ([place])   Cerebrovascular accident (CVA) due to embolism of left cerebellar artery ([facility])   Atrial fibrillation ([place])   Subtherapeutic international normalized ratio (INR)   Diarrhea   Acute renal failure on CKD Stage 3 -Renal ultrasound: Negative hydronephrosis Recent Labs  Lab 11/30/17 1632 11/30/17 1645 12/01/17 0230 12/02/17 0356 12/03/17 0309 12/04/17 0224  CREATININE 13.10* 14.00* 10.57* 4.72* 1.87* 1.41*  -improving with hydration  -D5 at 48ml/hr -S/P Foley placement -Nephrology following  Obstructive uropathy -Unclear etiology -CT abdomen pelvis nondiagnostic for cause see results below  LEFT Adrenal mass -Incidental finding on CT scan see results below. Ensure PCP follows up on mass may require Endocrine consult as outpatient.   Chronic Atrial fib on  Coumadin/Supratherapeutic INR Recent Labs  Lab 11/30/17 1632 12/01/17 0230 12/01/17 1527 12/02/17 0356 12/03/17 0309 12/04/17 0224  INR >10.00* >10.00* 2.11 1.55 1.36 1.27  -IR now normalized -Multiple failed DCCV -Per EMR scheduled for another ablation with Dr. Curt Bears EP this month. -Set up for A fib ablation 12/11/2017.  However  pre-operative cardiac CT has been canceled so not clear that she is actually going to undergo the ablation,  -Amiodarone 200 mg daily -1/11 restart Coreg 3.125 mg BID -1/11 start Heparin drip--> Coumadin -1/11 restart Coumadin per pharmacy  Chronic systolic CHF/Nonischemic Cardiomyopathy -Strict in and out -Daily weight -Transfusion hemoglobin<8 -1/9 transfuse 1 unit PRBC -1/10 transfuse 1 unit PRBC  Essential HTN -See A. fib  GI bleed/acute blood loss anemia vs anemia chronic disease -BRBPR -1/7 fecal occult positive (INR supratherapeutic at the time) - 1/9 INR corrected repeat fecal occult blood: INR daily -Anemia panel pending Recent Labs  Lab 12/01/17 1527 12/02/17 0356 12/02/17 0926 12/03/17 0309 12/03/17 1801 12/04/17 0224  HGB 8.5* 7.4* 7.6* 7.7* 9.8* 9.5*  -1/11 S/P colonoscopy: Diffuse diverticulosis see results below consulted GI: Patient overdue for repeat colonoscopy.  -Repeat colonoscopy in 5-10 years for surveillance, with Dr. Fuller Plan, if medically fit. - Resume Coumadin (warfarin) okay per GI.   Coumadin-induced coagulopathy Given ongoing GIB was tx w/ IV vitamin K - hope to simply lower INR to therapeutic range and not reverse entirely, but if drops below 2.0 will consider heparin gtt as long as hgb ok Recent Labs  Lab 11/30/17 1632 12/01/17 0230 12/01/17 1527 12/02/17 0356 12/03/17 0309 12/04/17 0224  INR >10.00* >10.00* 2.11  1.55 1.36 1.27     HLD   Hypokalemia -Potassium goal> 4 -K Dur 40 mEq  Hypomagnesemia -Magnesium goal l> 2 -Magnesium 3 g  Hypernatremia -See acute renal failure     DVT  prophylaxis: SCD Code Status: full Family Communication: none Disposition Plan: TBD   Consultants:  Nephrology     Procedures/Significant Events:  07/15/2017 Echocardiogram:- Left ventricle: severe concentric hypertrophy.  -LVEF=  30% to 35%. Diffuse hypokinesis. - Mitral valve: mild to moderate regurgitation. -Left atrium: Severely dilated. -Right atrium: moderately dilated. - Pericardium, extracardiac: A trivial pericardial effusion was   identified.  ----------------------------------------------------------------------------------------------------------------------------------- 1/9 CT abdomen and pelvis WO contrast:No urolithiasis. Foley catheter in place with bladder wall thickening. Prominence of both renal collecting systems without frank hydronephrosis. No evidence of obstructive uropathy or bladder outlet obstruction or obstructive uropathy. 2. Colonic diverticulosis without diverticulitis. 3. Bilateral renal cysts. 4. Incidental findings of left adrenal adenoma right adrenal adenoma versus myelolipoma, and Aortic Atherosclerosis (ICD10-I70.0). 1/11 S/P colonoscopy:The examined portion of the ileum was normal. - Diverticulosis in the entire examined colon. Presumed self-limited diverticular bleed  I have personally reviewed and interpreted all radiology studies and my findings are as above.  VENTILATOR SETTINGS:    Cultures   Antimicrobials: Anti-infectives (From admission, onward)   Start     Stop   12/01/17 0000  piperacillin-tazobactam (ZOSYN) IVPB 3.375 g  Status:  Discontinued     11/30/17 1659   11/30/17 1700  cefTRIAXone (ROCEPHIN) 1 g in dextrose 5 % 50 mL IVPB     11/30/17 1804   11/30/17 1630  piperacillin-tazobactam (ZOSYN) IVPB 3.375 g  Status:  Discontinued     11/30/17 1701       Devices    LINES / TUBES:      Continuous Infusions: . sodium chloride    . dextrose 75 mL/hr at 12/04/17 0215     Objective: Vitals:   12/03/17  1220 12/03/17 2015 12/03/17 2356 12/04/17 0427  BP: (!) 113/96 (!) 106/49 137/73 130/64  Pulse:  71 61 81  Resp: 20 19 17 16   Temp: 99.7 F (37.6 C) 98 F (36.7 C) 98.1 F (36.7 C) 98.9 F (37.2 C)  TempSrc: Oral Oral Oral Oral  SpO2:  100% 98% 100%  Weight:    227 lb 14.4 oz (103.4 kg)  Height:        Intake/Output Summary (Last 24 hours) at 12/04/2017 0745 Last data filed at 12/04/2017 1025 Gross per 24 hour  Intake 5905 ml  Output 3450 ml  Net 2455 ml   Filed Weights   12/02/17 0614 12/03/17 0325 12/04/17 0427  Weight: 247 lb 5.7 oz (112.2 kg) 229 lb 3.2 oz (104 kg) 227 lb 14.4 oz (103.4 kg)    Physical Exam: Patient in endoscopy lab no charge    Data Reviewed: Care during the described time interval was provided by me .  I have reviewed this patient's available data, including medical history, events of note, physical examination, and all test results as part of my evaluation.   CBC: Recent Labs  Lab 11/30/17 1632  12/01/17 1527 12/02/17 0356 12/02/17 0926 12/03/17 0309 12/03/17 1801 12/04/17 0224  WBC 6.5   < > 6.7 6.9  --  7.3 8.6 8.7  NEUTROABS 5.6  --   --   --   --   --   --   --   HGB 8.8*   < > 8.5* 7.4* 7.6* 7.7* 9.8* 9.5*  HCT 26.7*   < >  26.3* 22.2* 23.6* 23.3* 31.3* 30.0*  MCV 90.8   < > 90.4 90.6  --  92.8 92.9 93.5  PLT 163   < > 158 126*  --  113* 116* 120*   < > = values in this interval not displayed.   Basic Metabolic Panel: Recent Labs  Lab 11/30/17 1632 11/30/17 1645 12/01/17 0230 12/02/17 0356 12/02/17 1646 12/03/17 0309 12/04/17 0224  NA 134* 137 138 144  --  147* 148*  K 4.3 4.3 4.1 3.3* 3.6 3.9 3.7  CL 108 109 111 111  --  113* 112*  CO2 14*  --  10* 22  --  29 28  GLUCOSE 113* 107* 92 101*  --  100* 90  BUN 164* >140* 160* 109*  --  54* 27*  CREATININE 13.10* 14.00* 10.57* 4.72*  --  1.87* 1.41*  CALCIUM 7.9*  --  7.7* 7.6*  --  7.8* 8.4*  MG  --   --   --   --  1.3* 1.8 1.4*  PHOS  --   --   --  3.8  --   --   --     GFR: Estimated Creatinine Clearance: 48.1 mL/min (A) (by C-G formula based on SCr of 1.41 mg/dL (H)). Liver Function Tests: Recent Labs  Lab 11/30/17 1632 12/02/17 0356  AST 35  --   ALT 34  --   ALKPHOS 40  --   BILITOT 0.8  --   PROT 6.0*  --   ALBUMIN 3.2* 2.6*   No results for input(s): LIPASE, AMYLASE in the last 168 hours. No results for input(s): AMMONIA in the last 168 hours. Coagulation Profile: Recent Labs  Lab 12/01/17 0230 12/01/17 1527 12/02/17 0356 12/03/17 0309 12/04/17 0224  INR >10.00* 2.11 1.55 1.36 1.27   Cardiac Enzymes: No results for input(s): CKTOTAL, CKMB, CKMBINDEX, TROPONINI in the last 168 hours. BNP (last 3 results) No results for input(s): PROBNP in the last 8760 hours. HbA1C: No results for input(s): HGBA1C in the last 72 hours. CBG: No results for input(s): GLUCAP in the last 168 hours. Lipid Profile: No results for input(s): CHOL, HDL, LDLCALC, TRIG, CHOLHDL, LDLDIRECT in the last 72 hours. Thyroid Function Tests: No results for input(s): TSH, T4TOTAL, FREET4, T3FREE, THYROIDAB in the last 72 hours. Anemia Panel: Recent Labs    12/01/17 1527 12/02/17 1646  VITAMINB12  --  486  FOLATE  --  10.5  FERRITIN 325* 439*  TIBC 227* 185*  IRON 151 61  RETICCTPCT  --  3.3*   Urine analysis:    Component Value Date/Time   COLORURINE YELLOW 11/30/2017 [place] 11/30/2017 1653   LABSPEC 1.013 11/30/2017 1653   PHURINE 5.0 11/30/2017 1653   GLUCOSEU NEGATIVE 11/30/2017 1653   GLUCOSEU NEGATIVE 11/10/2017 1216   HGBUR MODERATE (A) 11/30/2017 1653   BILIRUBINUR NEGATIVE 11/30/2017 1653   KETONESUR NEGATIVE 11/30/2017 1653   PROTEINUR NEGATIVE 11/30/2017 1653   UROBILINOGEN 0.2 11/10/2017 1216   NITRITE NEGATIVE 11/30/2017 1653   LEUKOCYTESUR NEGATIVE 11/30/2017 1653   Sepsis Labs: @LABRCNTIP (procalcitonin:4,lacticidven:4)  ) Recent Results (from the past 240 hour(s))  Urine culture     Status: None    Collection Time: 11/30/17  4:17 PM  Result Value Ref Range Status   Specimen Description URINE, RANDOM  Final   Special Requests NONE  Final   Culture NO GROWTH  Final   Report Status 12/01/2017 FINAL  Final  Blood Culture (routine x 2)  Status: None (Preliminary result)   Collection Time: 11/30/17  4:35 PM  Result Value Ref Range Status   Specimen Description BLOOD LEFT ANTECUBITAL  Final   Special Requests   Final    BOTTLES DRAWN AEROBIC AND ANAEROBIC Blood Culture results may not be optimal due to an excessive volume of blood received in culture bottles   Culture NO GROWTH 3 DAYS  Final   Report Status PENDING  Incomplete  Blood Culture (routine x 2)     Status: None (Preliminary result)   Collection Time: 11/30/17  5:22 PM  Result Value Ref Range Status   Specimen Description BLOOD BLOOD LEFT WRIST  Final   Special Requests   Final    Blood Culture adequate volume BOTTLES DRAWN AEROBIC AND ANAEROBIC   Culture NO GROWTH 3 DAYS  Final   Report Status PENDING  Incomplete  C difficile quick scan w PCR reflex     Status: None   Collection Time: 11/30/17  8:50 PM  Result Value Ref Range Status   C Diff antigen NEGATIVE NEGATIVE Final   C Diff toxin NEGATIVE NEGATIVE Final   C Diff interpretation No C. difficile detected.  Final  MRSA PCR Screening     Status: None   Collection Time: 12/01/17  3:19 PM  Result Value Ref Range Status   MRSA by PCR NEGATIVE NEGATIVE Final    Comment:        The GeneXpert MRSA Assay (FDA approved for NASAL specimens only), is one component of a comprehensive MRSA colonization surveillance program. It is not intended to diagnose MRSA infection nor to guide or monitor treatment for MRSA infections.          Radiology Studies: No results found.      Scheduled Meds: . amiodarone  200 mg Oral Daily  . metoCLOPramide  10 mg Oral Once  . potassium chloride  40 mEq Oral Once  . rosuvastatin  10 mg Oral Daily   Continuous  Infusions: . sodium chloride    . dextrose 75 mL/hr at 12/04/17 0215     LOS: 4 days    Time spent: 40 minutes    Amiria Orrison, Geraldo Docker, MD [facility] Pager [phone]   If 7PM-7AM, please contact night-coverage [email or web address] Password [facility] 12/04/2017, 7:45 AM

## 2017-12-04 NOTE — Progress Notes (Signed)
Pt has Heparin drip, IX  Magnesium sulfate, and dextrose 5% infusion ordered. Has one IV site and refusing a second site be inserted. Will infuse Magnesium first, then heparin drip. Pharmacy made aware. Bohden Dung [place] Toshiyuki Fredell. RN, BSN

## 2017-12-05 DIAGNOSIS — K5711 Diverticulosis of small intestine without perforation or abscess with bleeding: Secondary | ICD-10-CM

## 2017-12-05 DIAGNOSIS — K5731 Diverticulosis of large intestine without perforation or abscess with bleeding: Secondary | ICD-10-CM

## 2017-12-05 LAB — POTASSIUM: Potassium: 4 mmol/L (ref 3.5–5.1)

## 2017-12-05 LAB — BASIC METABOLIC PANEL
Anion gap: 5 (ref 5–15)
BUN: 14 mg/dL (ref 6–20)
CO2: 28 mmol/L (ref 22–32)
Calcium: 7.8 mg/dL — ABNORMAL LOW (ref 8.9–10.3)
Chloride: 111 mmol/L (ref 101–111)
Creatinine, Ser: 1.44 mg/dL — ABNORMAL HIGH (ref 0.44–1.00)
GFR calc Af Amer: 43 mL/min — ABNORMAL LOW (ref >60)
GFR calc non Af Amer: 37 mL/min — ABNORMAL LOW (ref >60)
Glucose, Bld: 101 mg/dL — ABNORMAL HIGH (ref 65–99)
Potassium: 3.9 mmol/L (ref 3.5–5.1)
Sodium: 144 mmol/L (ref 135–145)

## 2017-12-05 LAB — CULTURE, BLOOD (ROUTINE X 2)
Culture: NO GROWTH
Culture: NO GROWTH
SPECIAL REQUESTS: ADEQUATE

## 2017-12-05 LAB — CBC
HCT: 25.3 % — ABNORMAL LOW (ref 36.0–46.0)
Hemoglobin: 8 g/dL — ABNORMAL LOW (ref 12.0–15.0)
MCH: 30.2 pg (ref 26.0–34.0)
MCHC: 31.6 g/dL (ref 30.0–36.0)
MCV: 95.5 fL (ref 78.0–100.0)
Platelets: 112 10*3/uL — ABNORMAL LOW (ref 150–400)
RBC: 2.65 MIL/uL — ABNORMAL LOW (ref 3.87–5.11)
RDW: 15.8 % — ABNORMAL HIGH (ref 11.5–15.5)
WBC: 7.3 10*3/uL (ref 4.0–10.5)

## 2017-12-05 LAB — HEPARIN LEVEL (UNFRACTIONATED): Heparin Unfractionated: 0.31 IU/mL (ref 0.30–0.70)

## 2017-12-05 LAB — LIPID PANEL
CHOL/HDL RATIO: 2 ratio
CHOLESTEROL: 94 mg/dL (ref 0–200)
HDL: 47 mg/dL (ref >40)
LDL Cholesterol: 36 mg/dL (ref 0–99)
Triglycerides: 54 mg/dL (ref <150)
VLDL: 11 mg/dL (ref 0–40)

## 2017-12-05 LAB — MAGNESIUM
Magnesium: 1.7 mg/dL (ref 1.7–2.4)
Magnesium: 1.7 mg/dL (ref 1.7–2.4)

## 2017-12-05 LAB — PROTIME-INR
INR: 1.31
PROTHROMBIN TIME: 16.2 s — AB (ref 11.4–15.2)

## 2017-12-05 MED ORDER — WARFARIN SODIUM 5 MG PO TABS
5.0000 mg | ORAL_TABLET | Freq: Once | ORAL | Status: AC
  Administered 2017-12-05: 5 mg via ORAL
  Filled 2017-12-05: qty 1

## 2017-12-05 MED ORDER — SPIRONOLACTONE 25 MG PO TABS
25.0000 mg | ORAL_TABLET | Freq: Every day | ORAL | Status: DC
  Administered 2017-12-05 – 2017-12-08 (×4): 25 mg via ORAL
  Filled 2017-12-05 (×4): qty 1

## 2017-12-05 MED ORDER — MAGNESIUM SULFATE 2 GM/50ML IV SOLN
2.0000 g | Freq: Once | INTRAVENOUS | Status: AC
  Administered 2017-12-05: 2 g via INTRAVENOUS
  Filled 2017-12-05: qty 50

## 2017-12-05 NOTE — Progress Notes (Addendum)
ANTICOAGULATION CONSULT NOTE - Consult  Pharmacy Consult for heparin and warfarin Indication: atrial fibrillation  Allergies  Allergen Reactions  . Ace Inhibitors Palpitations   Patient Measurements: Height: 5\' 5"  (165.1 cm) Weight: 227 lb 14.4 oz (103.4 kg) IBW/kg (Calculated) : 57 Heparin Dosing Weight: 81  Vital Signs: Temp: 98.6 F (37 C) (01/11 2024) Temp Source: Oral (01/11 2024) BP: 107/67 (01/11 2024) Pulse Rate: 79 (01/11 2024)  Labs: Recent Labs    12/03/17 0309 12/03/17 1801 12/04/17 0224 12/05/17 0243  HGB 7.7* 9.8* 9.5* 8.0*  HCT 23.3* 31.3* 30.0* 25.3*  PLT 113* 116* 120* PENDING  LABPROT 16.7*  --  15.8* 16.2*  INR 1.36  --  1.27 1.31  HEPARINUNFRC  --   --   --  <0.10*  CREATININE 1.87*  --  1.41* 1.44*   Estimated Creatinine Clearance: 47.1 mL/min (A) (by C-G formula based on SCr of 1.44 mg/dL (H)).  Medical History: Past Medical History:  Diagnosis Date  . Arthritis   . Atrial fibrillation ([place])    a. s/p multiple cardioversions; failed tikosyn/sotalol.  Marland Kitchen CAD in native artery, s/p cardiac cath with non obstructive CAD 10/24/2016  . CHF (congestive heart failure) ([place])   . DIVERTICULITIS, HX OF 07/25/2007  . DIVERTICULOSIS, COLON 07/22/2007  . Dyspnea   . Edema, peripheral    a. chronic BLE edema, R>L. Prior trauma from dog attack and accident.  Marland Kitchen HYPERLIPIDEMIA 02/03/2008  . Hypersomnia    declines w/u  . Hypertension   . MENOPAUSAL DISORDER 01/09/2011  . Morbid obesity ([place]) 07/22/2007  . NICM (nonischemic cardiomyopathy) ([place]) 10/24/2016  . Raynaud's syndrome 07/22/2007  . Stroke ([place]) 2017  . THYROID NODULE, RIGHT 01/04/2010  . VITAMIN D DEFICIENCY 01/09/2011   Qualifier: Diagnosis of  By: [name], [name]    Medications:  Medications Prior to Admission  Medication Sig Dispense Refill Last Dose  . acetaminophen (TYLENOL) 325 MG tablet Take 650 mg by mouth every 6 (six) hours as needed (pain).    PRN at PRN  . carvedilol (COREG) 3.125 MG  tablet Take 1 tablet (3.125 mg total) by mouth 2 (two) times daily. 180 tablet 3 11/30/2017 at 1000  . diclofenac sodium (VOLTAREN) 1 % GEL Apply 4 g topically 4 (four) times daily as needed (As directed for skin). (Patient taking differently: Apply 4 g topically 4 (four) times daily as needed (for pain). ) 100 g 0 PRN at PRN  . furosemide (LASIX) 40 MG tablet Take 20 mg by mouth daily.   11/30/2017 at Unknown time  . Investigational - Study Medication Take 1 tablet by mouth 2 (two) times daily. Study name: Galactic HF Study  Additional study details: Omecamtiv Mecarbil or Placebo 1 each PRN 11/30/2017 at am  . isosorbide-hydrALAZINE (BIDIL) 20-37.5 MG tablet Take 1 tablet by mouth 3 (three) times daily. (Patient taking differently: Take 1 tablet by mouth 2 (two) times daily. ) 180 tablet 11 11/30/2017 at Unknown time  . ranolazine (RANEXA) 500 MG 12 hr tablet Take 1 tablet (500 mg total) by mouth 2 (two) times daily. 60 tablet 5 11/30/2017 at am  . rosuvastatin (CRESTOR) 10 MG tablet TAKE 1 TABLET (10 MG TOTAL) BY MOUTH DAILY. 90 tablet 2 11/30/2017 at Unknown time  . sacubitril-valsartan (ENTRESTO) 49-51 MG Take 1 tablet by mouth 2 (two) times daily. 60 tablet 3 11/30/2017 at am  . spironolactone (ALDACTONE) 25 MG tablet Take 0.5 tablets (12.5 mg total) by mouth daily. 45 tablet 3  11/30/2017 at Unknown time  . tamsulosin (FLOMAX) 0.4 MG CAPS capsule Take 1 capsule (0.4 mg total) by mouth daily. 30 capsule 1 11/30/2017 at am  . furosemide (LASIX) 20 MG tablet Take 1 tablet (20 mg total) by mouth daily. (Patient not taking: Reported on 11/30/2017)   Not Taking at Unknown time    Assessment: 67 y/o female admitted 11/30/2017 with obstructive uropathy, hematuria, AKI and N/V. She takes warfarin PTA for Afib and hx stroke. Also had BRBPR s/p colonoscopy today and presumed self-limited diverticular bleed. Admit INR >10 and was reversed with two vitamin K 5 mg doses. Pharmacy consulted to resume warfarin and bridge with IV  heparin.  Heparin level undetectable; Heparin infusion was delayed yesterday evening 2/2 IV access issues. Infusion started ~2200 and no delays once the infusion was started. No overt bleeding reported by RN. HgB 8.0 low at baseline, PLT pending but low at baseline  PTA warfarin regimen: 3 mg daily except 4.5 mg Wed per warfarin clinic  Goal of Therapy:  INR 2-3 Heparin level 0.3-0.5 units/ml due to recent bleeding Monitor platelets by anticoagulation protocol: Yes   Plan:  Increase heparin drip to 1350 units/hr with no bolus 8 hr heparin level Daily heparin level, CBC, INR Monitor for s/sx of bleeding  Georga Bora, PharmD Clinical Pharmacist 12/05/2017 4:45 AM

## 2017-12-05 NOTE — Progress Notes (Addendum)
ANTICOAGULATION CONSULT NOTE - Initial Consult  Pharmacy Consult for heparin and warfarin Indication: atrial fibrillation  Allergies  Allergen Reactions  . Ace Inhibitors Palpitations   Patient Measurements: Height: 5\' 5"  (165.1 cm) Weight: 229 lb 4.8 oz (104 kg) IBW/kg (Calculated) : 57 Heparin Dosing Weight: 81  Vital Signs: Temp: 98.7 F (37.1 C) (01/12 1154) Temp Source: Oral (01/12 1154) BP: 124/68 (01/12 1154) Pulse Rate: 62 (01/12 1154)  Labs: Recent Labs    12/03/17 0309 12/03/17 1801 12/04/17 0224 12/05/17 0243 12/05/17 1203  HGB 7.7* 9.8* 9.5* 8.0*  --   HCT 23.3* 31.3* 30.0* 25.3*  --   PLT 113* 116* 120* 112*  --   LABPROT 16.7*  --  15.8* 16.2*  --   INR 1.36  --  1.27 1.31  --   HEPARINUNFRC  --   --   --  <0.10* 0.31  CREATININE 1.87*  --  1.41* 1.44*  --    Estimated Creatinine Clearance: 47.2 mL/min (A) (by C-G formula based on SCr of 1.44 mg/dL (H)).  Assessment: 67 y/o female with h/o afib and stroke on warfarin PTA admitted 11/30/2017 with obstructive uropathy, hematuria, AKI and N/V. Also had BRBPR s/p colonoscopy and presumed self-limited diverticular bleed. Admit INR >10 and was reversed with vitamin K 5 mg PO and 5 mg IV. Pharmacy consulted to resume warfarin and bridge with IV heparin. INR remains below goal at 1.31 this AM. Heparin level just above goal at 0.31. Hgb 8.0, pltc 112. No bleeding noted.  PTA warfarin dose: 3 mg daily except 4.5 mg Wed (per warfarin clinic)  Goal of Therapy:  INR 2-3 Heparin level 0.3-0.5 units/ml due to recent bleeding Monitor platelets by anticoagulation protocol: Yes   Plan:  Continue heparin drip at 1350 units/hr  Warfarin 5 mg PO x1 tonight Daily heparin level, INR, and CBC Monitor for s/sx of bleeding  Erin N. Gerarda Fraction, PharmD PGY1 Pharmacy Resident Pager: [phone] 12/05/2017 1:02 PM

## 2017-12-05 NOTE — Progress Notes (Signed)
Verbal order received to remove foley catheter.  Foley removed.  150 cc urine in collection bag.  Documented in Epic.

## 2017-12-05 NOTE — Progress Notes (Signed)
PROGRESS NOTE    Patricia Wagner  QTM:[id number] DOB: [birth year] DOA: 11/30/2017 PCP: Biagio Borg, MD   Brief Narrative:  67 y.o. BF PMHx CVA, A-Fib on coumadin, Chronic Systolic CHF/NICM,  A. Fib S/P multiple failed DCCV, CAD, HTN,HLD, Menopausal DO,  Arthritis, DIVERTICULITIS, DIVERTICULOSIS, Morbid Obesity, Raynaud's syndrome, THYROID NODULE, RIGHT  Presented with over a week of worsening inability to urinate.  She reported dribbling only, with the inability to fully empty her bladder, associated w/ suprapubic pain.   In the ED a foley returned over 2 liters of urine.  She was also noted to have AKI with creatinine over 14.     Subjective: 1/12 /O 4, negative CP, negative SOB, negative abdominal pain, negative N/V. Has not had BM. Feels physicians are not explained to her tests/test result, therefore very upset..       Assessment & Plan:   Principal Problem:   Acute renal failure superimposed on chronic kidney disease ([place]) Active Problems:   Essential hypertension   Raynaud's syndrome   Diverticulosis of colon with hemorrhage   CAD in native artery, s/p cardiac cath with non obstructive CAD   CHF (congestive heart failure) ([place])   Cerebrovascular accident (CVA) due to embolism of left cerebellar artery ([facility])   Atrial fibrillation ([place])   Subtherapeutic international normalized ratio (INR)   Diarrhea   Acute renal failure on CKD Stage 3(baseline Cr 1.3-2.0) -Renal ultrasound: Negative hydronephrosis Recent Labs  Lab 11/30/17 1645 12/01/17 0230 12/02/17 0356 12/03/17 0309 12/04/17 0224 12/05/17 0243  CREATININE 14.00* 10.57* 4.72* 1.87* 1.41* 1.44*  -improving with hydration  -D5 at 41ml/hr -DC Foley. Voiding trial -Nephrology has signed off  Obstructive uropathy -Unclear etiology -CT abdomen pelvis nondiagnostic see results below  LEFT Adrenal mass -Incidental finding on CT scan see results below. Counseled patient that she will need to follow-up with her  PCP. ECP would need to possibly have endocrinologist evaluate as outpatient.   Chronic Atrial fib on Coumadin/Supratherapeutic INR Recent Labs  Lab 12/01/17 0230 12/01/17 1527 12/02/17 0356 12/03/17 0309 12/04/17 0224 12/05/17 0243  INR >10.00* 2.11 1.55 1.36 1.27 1.31  -Multiple failed DCCV -Per EMR scheduled for another ablation with Dr. Curt Bears EP this month. -Set up for A fib ablation 12/11/2017.  However  pre-operative cardiac CT has been canceled so not clear that she is actually going to undergo the ablation,  -Amiodarone 200 mg daily -1/11 restart Coreg 3.125 mg BID -1/11 start Heparin drip--> Coumadin -1/11 restart Coumadin per pharmacy -1/12 restart spironolactone 25 mg daily. Will continue to monitor BP and if she tolerates add 1 agent at a time, until all CHF medication on board per CHF team..  Chronic systolic CHF/Nonischemic Cardiomyopathy -Strict in and out since admission -1.5 L -Daily weight Filed Weights   12/04/17 0427 12/04/17 1219 12/05/17 0555  Weight: 227 lb 14.4 oz (103.4 kg) 227 lb 14.4 oz (103.4 kg) 229 lb 4.8 oz (104 kg)  -Transfusion hemoglobin<8 -1/9 transfuse 1 unit PRBC -1/10 transfuse 1 unit PRBC  Essential HTN -See A. fib  GI bleed/acute blood loss anemia vs anemia chronic disease/Diverticulosis -BRBPR -1/7 fecal occult positive (INR supratherapeutic at the time) - 1/9 INR corrected repeat fecal occult blood: INR daily -Anemia panel: Most consistent with normocytic anemia Recent Labs  Lab 12/02/17 0356 12/02/17 0926 12/03/17 0309 12/03/17 1801 12/04/17 0224 12/05/17 0243  HGB 7.4* 7.6* 7.7* 9.8* 9.5* 8.0*  -1/11 S/P colonoscopy: Diffuse diverticulosis see results below consulted GI: Patient overdue for repeat  colonoscopy.  -Repeat colonoscopy in 5-10 years for surveillance, with Dr. Fuller Plan, if medically fit. - 1/12 have resumed Coumadin per GI.   Coumadin-induced coagulopathy Recent Labs  Lab 12/01/17 0230 12/01/17 1527  12/02/17 0356 12/03/17 0309 12/04/17 0224 12/05/17 0243  INR >10.00* 2.11 1.55 1.36 1.27 1.31  -reversed Coumadin with IV vitamin K for procedure.   HLD  -Lipid panel ithin ADA/AHA guidelines -Crestor 10 mg daily  Hypokalemia -Potassium goal> 4 -at goal  Hypomagnesemia -Magnesium goal> 2  Hypernatremia -resolved  Hypocalcemia     DVT prophylaxis: SCD Code Status: full Family Communication: none Disposition Plan: TBD   Consultants:  Nephrology CHF team GI    Procedures/Significant Events:  07/15/2017 Echocardiogram:- Left ventricle: severe concentric hypertrophy.  -LVEF=  30% to 35%. Diffuse hypokinesis. - Mitral valve: mild to moderate regurgitation. -Left atrium: Severely dilated. -Right atrium: moderately dilated. - Pericardium, extracardiac: A trivial pericardial effusion was   identified.  ----------------------------------------------------------------------------------------------------------------------------------- 1/9 CT abdomen and pelvis WO contrast:No urolithiasis. Foley catheter in place with bladder wall thickening. Prominence of both renal collecting systems without frank hydronephrosis. No evidence of obstructive uropathy or bladder outlet obstruction or obstructive uropathy. 2. Colonic diverticulosis without diverticulitis. 3. Bilateral renal cysts. 4. Incidental findings of left adrenal adenoma right adrenal adenoma versus myelolipoma, and Aortic Atherosclerosis (ICD10-I70.0). 1/11 S/P colonoscopy:The examined portion of the ileum was normal. - Diverticulosis in the entire examined colon. Presumed self-limited diverticular bleed  I have personally reviewed and interpreted all radiology studies and my findings are as above.  VENTILATOR SETTINGS:    Cultures   Antimicrobials: Anti-infectives (From admission, onward)   Start     Stop   12/01/17 0000  piperacillin-tazobactam (ZOSYN) IVPB 3.375 g  Status:  Discontinued     11/30/17  1659   11/30/17 1700  cefTRIAXone (ROCEPHIN) 1 g in dextrose 5 % 50 mL IVPB     11/30/17 1804   11/30/17 1630  piperacillin-tazobactam (ZOSYN) IVPB 3.375 g  Status:  Discontinued     11/30/17 1701       Devices    LINES / TUBES:      Continuous Infusions: . dextrose    . heparin 1,350 Units/hr (12/05/17 0458)     Objective: Vitals:   12/05/17 0549 12/05/17 0555 12/05/17 0756 12/05/17 0833  BP: (!) 107/58  103/70 124/82  Pulse: 68  66 66  Resp: (!) 21     Temp: 98.6 F (37 C)  99.4 F (37.4 C)   TempSrc: Oral  Oral   SpO2: 96%     Weight:  229 lb 4.8 oz (104 kg)    Height:        Intake/Output Summary (Last 24 hours) at 12/05/2017 0856 Last data filed at 12/05/2017 0700 Gross per 24 hour  Intake 1160.05 ml  Output 800 ml  Net 360.05 ml   Filed Weights   12/04/17 0427 12/04/17 1219 12/05/17 0555  Weight: 227 lb 14.4 oz (103.4 kg) 227 lb 14.4 oz (103.4 kg) 229 lb 4.8 oz (104 kg)    Physical Exam:  General: A/O 4,No acute respiratory distress Neck:  Negative scars, masses, torticollis, lymphadenopathy, JVD Lungs: Clear to auscultation bilaterally without wheezes or crackles Cardiovascular: Regular rate and rhythm without murmur gallop or rub normal S1 and S2 Abdomen: positive mild suprapubic abdominal pain(secondary Foley?), nondistended, positive soft, bowel sounds, no rebound, no ascites, no appreciable mass Extremities: No significant cyanosis, clubbing, or edema bilateral lower extremities Skin: Negative rashes, lesions, ulcers Psychiatric:  Negative  depression, positive anxiety, negative fatigue, negative mania, positive tearful  Central nervous system:  Cranial nerves II through XII intact, tongue/uvula midline, all extremities muscle strength 5/5, sensation intact throughout, negative dysarthria, negative expressive aphasia, negative receptive aphasia.     Data Reviewed: Care during the described time interval was provided by me .  I have reviewed  this patient's available data, including medical history, events of note, physical examination, and all test results as part of my evaluation.   CBC: Recent Labs  Lab 11/30/17 1632  12/02/17 0356 12/02/17 0926 12/03/17 0309 12/03/17 1801 12/04/17 0224 12/05/17 0243  WBC 6.5   < > 6.9  --  7.3 8.6 8.7 7.3  NEUTROABS 5.6  --   --   --   --   --   --   --   HGB 8.8*   < > 7.4* 7.6* 7.7* 9.8* 9.5* 8.0*  HCT 26.7*   < > 22.2* 23.6* 23.3* 31.3* 30.0* 25.3*  MCV 90.8   < > 90.6  --  92.8 92.9 93.5 95.5  PLT 163   < > 126*  --  113* 116* 120* 112*   < > = values in this interval not displayed.   Basic Metabolic Panel: Recent Labs  Lab 12/01/17 0230 12/02/17 0356 12/02/17 1646 12/03/17 0309 12/04/17 0224 12/05/17 0243  NA 138 144  --  147* 148* 144  K 4.1 3.3* 3.6 3.9 3.7 3.9  CL 111 111  --  113* 112* 111  CO2 10* 22  --  29 28 28   GLUCOSE 92 101*  --  100* 90 101*  BUN 160* 109*  --  54* 27* 14  CREATININE 10.57* 4.72*  --  1.87* 1.41* 1.44*  CALCIUM 7.7* 7.6*  --  7.8* 8.4* 7.8*  MG  --   --  1.3* 1.8 1.4* 1.7  PHOS  --  3.8  --   --   --   --    GFR: Estimated Creatinine Clearance: 47.2 mL/min (A) (by C-G formula based on SCr of 1.44 mg/dL (H)). Liver Function Tests: Recent Labs  Lab 11/30/17 1632 12/02/17 0356  AST 35  --   ALT 34  --   ALKPHOS 40  --   BILITOT 0.8  --   PROT 6.0*  --   ALBUMIN 3.2* 2.6*   No results for input(s): LIPASE, AMYLASE in the last 168 hours. No results for input(s): AMMONIA in the last 168 hours. Coagulation Profile: Recent Labs  Lab 12/01/17 1527 12/02/17 0356 12/03/17 0309 12/04/17 0224 12/05/17 0243  INR 2.11 1.55 1.36 1.27 1.31   Cardiac Enzymes: No results for input(s): CKTOTAL, CKMB, CKMBINDEX, TROPONINI in the last 168 hours. BNP (last 3 results) No results for input(s): PROBNP in the last 8760 hours. HbA1C: No results for input(s): HGBA1C in the last 72 hours. CBG: No results for input(s): GLUCAP in the last 168  hours. Lipid Profile: No results for input(s): CHOL, HDL, LDLCALC, TRIG, CHOLHDL, LDLDIRECT in the last 72 hours. Thyroid Function Tests: No results for input(s): TSH, T4TOTAL, FREET4, T3FREE, THYROIDAB in the last 72 hours. Anemia Panel: Recent Labs    12/02/17 1646  VITAMINB12 486  FOLATE 10.5  FERRITIN 439*  TIBC 185*  IRON 61  RETICCTPCT 3.3*   Urine analysis:    Component Value Date/Time   COLORURINE YELLOW 11/30/2017 [place] 11/30/2017 1653   LABSPEC 1.013 11/30/2017 1653   PHURINE 5.0 11/30/2017 1653   GLUCOSEU  NEGATIVE 11/30/2017 [place] 11/10/2017 1216   HGBUR MODERATE (A) 11/30/2017 1653   BILIRUBINUR NEGATIVE 11/30/2017 1653   KETONESUR NEGATIVE 11/30/2017 1653   PROTEINUR NEGATIVE 11/30/2017 1653   UROBILINOGEN 0.2 11/10/2017 1216   NITRITE NEGATIVE 11/30/2017 1653   LEUKOCYTESUR NEGATIVE 11/30/2017 1653   Sepsis Labs: @LABRCNTIP (procalcitonin:4,lacticidven:4)  ) Recent Results (from the past 240 hour(s))  Urine culture     Status: None   Collection Time: 11/30/17  4:17 PM  Result Value Ref Range Status   Specimen Description URINE, RANDOM  Final   Special Requests NONE  Final   Culture NO GROWTH  Final   Report Status 12/01/2017 FINAL  Final  Blood Culture (routine x 2)     Status: None (Preliminary result)   Collection Time: 11/30/17  4:35 PM  Result Value Ref Range Status   Specimen Description BLOOD LEFT ANTECUBITAL  Final   Special Requests   Final    BOTTLES DRAWN AEROBIC AND ANAEROBIC Blood Culture results may not be optimal due to an excessive volume of blood received in culture bottles   Culture NO GROWTH 4 DAYS  Final   Report Status PENDING  Incomplete  Blood Culture (routine x 2)     Status: None (Preliminary result)   Collection Time: 11/30/17  5:22 PM  Result Value Ref Range Status   Specimen Description BLOOD BLOOD LEFT WRIST  Final   Special Requests   Final    Blood Culture adequate volume  BOTTLES DRAWN AEROBIC AND ANAEROBIC   Culture NO GROWTH 4 DAYS  Final   Report Status PENDING  Incomplete  C difficile quick scan w PCR reflex     Status: None   Collection Time: 11/30/17  8:50 PM  Result Value Ref Range Status   C Diff antigen NEGATIVE NEGATIVE Final   C Diff toxin NEGATIVE NEGATIVE Final   C Diff interpretation No C. difficile detected.  Final  MRSA PCR Screening     Status: None   Collection Time: 12/01/17  3:19 PM  Result Value Ref Range Status   MRSA by PCR NEGATIVE NEGATIVE Final    Comment:        The GeneXpert MRSA Assay (FDA approved for NASAL specimens only), is one component of a comprehensive MRSA colonization surveillance program. It is not intended to diagnose MRSA infection nor to guide or monitor treatment for MRSA infections.          Radiology Studies: No results found.      Scheduled Meds: . amiodarone  200 mg Oral Daily  . carvedilol  3.125 mg Oral BID  . metoCLOPramide  10 mg Oral Once  . rosuvastatin  10 mg Oral Daily  . Warfarin - Pharmacist Dosing Inpatient   Does not apply q1800   Continuous Infusions: . dextrose    . heparin 1,350 Units/hr (12/05/17 0458)     LOS: 5 days    Time spent: 40 minutes    Tareka Jhaveri, Geraldo Docker, MD [facility] Pager [phone]   If 7PM-7AM, please contact night-coverage [email or web address] Password [facility] 12/05/2017, 8:56 AM

## 2017-12-06 DIAGNOSIS — E876 Hypokalemia: Secondary | ICD-10-CM

## 2017-12-06 DIAGNOSIS — E870 Hyperosmolality and hypernatremia: Secondary | ICD-10-CM

## 2017-12-06 DIAGNOSIS — R338 Other retention of urine: Secondary | ICD-10-CM

## 2017-12-06 LAB — PROTIME-INR
INR: 1.35
PROTHROMBIN TIME: 16.5 s — AB (ref 11.4–15.2)

## 2017-12-06 LAB — BASIC METABOLIC PANEL
ANION GAP: 7 (ref 5–15)
ANION GAP: 8 (ref 5–15)
BUN: 8 mg/dL (ref 6–20)
BUN: 8 mg/dL (ref 6–20)
CALCIUM: 8.4 mg/dL — AB (ref 8.9–10.3)
CHLORIDE: 109 mmol/L (ref 101–111)
CO2: 26 mmol/L (ref 22–32)
CO2: 25 mmol/L (ref 22–32)
Calcium: 8.3 mg/dL — ABNORMAL LOW (ref 8.9–10.3)
Chloride: 109 mmol/L (ref 101–111)
Creatinine, Ser: 1.34 mg/dL — ABNORMAL HIGH (ref 0.44–1.00)
Creatinine, Ser: 1.38 mg/dL — ABNORMAL HIGH (ref 0.44–1.00)
GFR calc Af Amer: 46 mL/min — ABNORMAL LOW (ref >60)
GFR calc non Af Amer: 41 mL/min — ABNORMAL LOW (ref >60)
GFR, EST AFRICAN AMERICAN: 47 mL/min — AB (ref >60)
GFR, EST NON AFRICAN AMERICAN: 39 mL/min — AB (ref >60)
GLUCOSE: 90 mg/dL (ref 65–99)
Glucose, Bld: 113 mg/dL — ABNORMAL HIGH (ref 65–99)
POTASSIUM: 4.1 mmol/L (ref 3.5–5.1)
Potassium: 4.1 mmol/L (ref 3.5–5.1)
SODIUM: 142 mmol/L (ref 135–145)
Sodium: 142 mmol/L (ref 135–145)

## 2017-12-06 LAB — HEPARIN LEVEL (UNFRACTIONATED): Heparin Unfractionated: 0.49 IU/mL (ref 0.30–0.70)

## 2017-12-06 LAB — CBC
HCT: 27.8 % — ABNORMAL LOW (ref 36.0–46.0)
HEMATOCRIT: 29.1 % — AB (ref 36.0–46.0)
HEMOGLOBIN: 8.9 g/dL — AB (ref 12.0–15.0)
HEMOGLOBIN: 8.5 g/dL — AB (ref 12.0–15.0)
MCH: 30 pg (ref 26.0–34.0)
MCH: 29.8 pg (ref 26.0–34.0)
MCHC: 30.6 g/dL (ref 30.0–36.0)
MCHC: 30.6 g/dL (ref 30.0–36.0)
MCV: 98 fL (ref 78.0–100.0)
MCV: 97.5 fL (ref 78.0–100.0)
PLATELETS: 146 10*3/uL — AB (ref 150–400)
Platelets: 161 10*3/uL (ref 150–400)
RBC: 2.97 MIL/uL — AB (ref 3.87–5.11)
RBC: 2.85 MIL/uL — AB (ref 3.87–5.11)
RDW: 15.5 % (ref 11.5–15.5)
RDW: 15.5 % (ref 11.5–15.5)
WBC: 8.1 10*3/uL (ref 4.0–10.5)
WBC: 8.2 10*3/uL (ref 4.0–10.5)

## 2017-12-06 LAB — MAGNESIUM
MAGNESIUM: 1.7 mg/dL (ref 1.7–2.4)
MAGNESIUM: 1.7 mg/dL (ref 1.7–2.4)

## 2017-12-06 MED ORDER — WARFARIN SODIUM 5 MG PO TABS
5.0000 mg | ORAL_TABLET | Freq: Once | ORAL | Status: AC
  Administered 2017-12-06: 5 mg via ORAL
  Filled 2017-12-06: qty 1

## 2017-12-06 MED ORDER — TAMSULOSIN HCL 0.4 MG PO CAPS
0.4000 mg | ORAL_CAPSULE | Freq: Every day | ORAL | Status: DC
  Administered 2017-12-06 – 2017-12-07 (×2): 0.4 mg via ORAL
  Filled 2017-12-06 (×2): qty 1

## 2017-12-06 NOTE — Progress Notes (Signed)
Repeated bladder scan yields 404 ml. Pt reports suprapubic pain. On call person Tylene Fantasia paged and notified. Order received for I/O cath. Performed and returned 400 ml, amber. Clear, fruity odor urine. Patricia Wagner. BSN, RN

## 2017-12-06 NOTE — Progress Notes (Signed)
PROGRESS NOTE    Patricia Wagner  GQQ:[id number] DOB: 15-Sep-1951 DOA: 11/30/2017 PCP: Biagio Borg, MD   Brief Narrative:  67 y.o. BF PMHx CVA, A-Fib on coumadin, Chronic Systolic CHF/NICM,  A. Fib S/P multiple failed DCCV, CAD, HTN,HLD, Menopausal DO,  Arthritis, DIVERTICULITIS, DIVERTICULOSIS, Morbid Obesity, Raynaud's syndrome, THYROID NODULE, RIGHT  Presented with over a week of worsening inability to urinate.  She reported dribbling only, with the inability to fully empty her bladder, associated w/ suprapubic pain.   In the ED a foley returned over 2 liters of urine.  She was also noted to have AKI with creatinine over 14.     Subjective: 1/13 A/O 4, negative CP, negative SOB, negative N/V. Negative BM. Feels that her bladder is full and requires to void but unable to void.   Assessment & Plan:   Principal Problem:   Acute renal failure superimposed on chronic kidney disease ([place]) Active Problems:   Essential hypertension   Raynaud's syndrome   Diverticulosis of colon with hemorrhage   CAD in native artery, s/p cardiac cath with non obstructive CAD   CHF (congestive heart failure) ([place])   Cerebrovascular accident (CVA) due to embolism of left cerebellar artery (HCC)   Atrial fibrillation ([place])   Subtherapeutic international normalized ratio (INR)   Diarrhea   Acute renal failure on CKD Stage 3(baseline Cr 1.3-2.0) -Renal ultrasound: Negative hydronephrosis Recent Labs  Lab 12/02/17 0356 12/03/17 0309 12/04/17 0224 12/05/17 0243 12/06/17 0810 12/06/17 0952  CREATININE 4.72* 1.87* 1.41* 1.44* 1.34* 1.38*  -Continue improving with hydration -D5@75ml /hr  -Nephrology has signed off  Obstructive uropathy/Urine Retention -Unclear etiology -CT abdomen pelvis nondiagnostic see results below -Patient failed voiding trial. After removal of Foley began to have retention overnight which required in and out cath multiple times. -Have paged Urology 2 have not returned  page. -1/13 start Flomax 0.4 mg daily: Reinsert Foley -1/14 again attempt to consult urology  LEFT Adrenal mass -Incidental finding on CT scan see results below. Counseled patient that she will need to follow-up with her PCP. ECP would need to possibly have endocrinologist evaluate as outpatient.   Chronic Atrial fib on Coumadin/Supratherapeutic INR Recent Labs  Lab 12/01/17 0230 12/01/17 1527 12/02/17 0356 12/03/17 0309 12/04/17 0224 12/05/17 0243  INR >10.00* 2.11 1.55 1.36 1.27 1.31  -Multiple failed DCCV -Per EMR scheduled for another ablation with Dr. Curt Bears EP this month. -Set up for A fib ablation 12/11/2017.  However  pre-operative cardiac CT has been canceled so not clear that she is actually going to undergo the ablation,  -Amiodarone 200 mg daily -1/11 restart Coreg 3.125 mg BID -1/11 start Heparin drip--> Coumadin -1/11 restart Coumadin per pharmacy -1/12 restart spironolactone 25 mg daily.  -1/13 currently BP low normal would not add another agent at this time but will continue  to monitor BP and if she tolerates add 1 agent at a time, until all CHF medication on board per CHF team.. -Consult dietitian for diet affecting Coumadin.  Chronic systolic CHF/Nonischemic Cardiomyopathy -Strict in and out since admission -944ml -Daily weight Filed Weights   12/04/17 1219 12/05/17 0555 12/06/17 0542  Weight: 227 lb 14.4 oz (103.4 kg) 229 lb 4.8 oz (104 kg) 234 lb 4.8 oz (106.3 kg)  -Transfusion hemoglobin<8 -1/9 transfuse 1 unit PRBC -1/10 transfuse 1 unit PRBC  Essential HTN -See A. fib  Lower GI bleed/acute blood loss anemia vs anemia chronic disease/Diverticulosis -BRBPR -1/7 fecal occult positive (INR supratherapeutic at the time) - 1/9  INR corrected repeat fecal occult blood: INR daily -Anemia panel: Most consistent with normocytic anemia Recent Labs  Lab 12/03/17 0309 12/03/17 1801 12/04/17 0224 12/05/17 0243 12/06/17 0810 12/06/17 0952  HGB 7.7* 9.8*  9.5* 8.0* 8.9* 8.5*  -1/11 S/P colonoscopy: Diffuse diverticulosis see results below consulted GI: Patient overdue for repeat colonoscopy.  -Repeat colonoscopy in 5-10 years for surveillance, with Dr. Fuller Plan, if medically fit. - 1/12 have resumed Coumadin per GI. -Hemoglobin stabilized   Coumadin-induced coagulopathy Recent Labs  Lab 12/01/17 0230 12/01/17 1527 12/02/17 0356 12/03/17 0309 12/04/17 0224 12/05/17 0243  INR >10.00* 2.11 1.55 1.36 1.27 1.31  -reversed Coumadin with IV vitamin K for procedure. -INR trending up.   HLD  -Lipid panel ithin ADA/AHA guidelines -Crestor 10 mg daily  Hypokalemia -Potassium goal> 4 -at goal  Hypomagnesemia -Magnesium goal> 2  Hypernatremia -resolved      DVT prophylaxis: Coumadin Code Status: full Family Communication: none Disposition Plan: TBD   Consultants:  Nephrology CHF team GI    Procedures/Significant Events:  07/15/2017 Echocardiogram:- Left ventricle: severe concentric hypertrophy.  -LVEF=  30% to 35%. Diffuse hypokinesis. - Mitral valve: mild to moderate regurgitation. -Left atrium: Severely dilated. -Right atrium: moderately dilated. - Pericardium, extracardiac: A trivial pericardial effusion was   identified.  ----------------------------------------------------------------------------------------------------------------------------------- 1/9 CT abdomen and pelvis WO contrast:No urolithiasis. Foley catheter in place with bladder wall thickening. Prominence of both renal collecting systems without frank hydronephrosis. No evidence of obstructive uropathy or bladder outlet obstruction or obstructive uropathy. -Colonic diverticulosis without diverticulitis. -Bilateral renal cysts. - Incidental findings of left adrenal adenoma right adrenal adenoma 1/11 S/P colonoscopy:The examined portion of the ileum was normal. - Diverticulosis in the entire examined colon. Presumed self-limited diverticular bleed  I  have personally reviewed and interpreted all radiology studies and my findings are as above.  VENTILATOR SETTINGS:    Cultures   Antimicrobials: Anti-infectives (From admission, onward)   Start     Stop   12/01/17 0000  piperacillin-tazobactam (ZOSYN) IVPB 3.375 g  Status:  Discontinued     11/30/17 1659   11/30/17 1700  cefTRIAXone (ROCEPHIN) 1 g in dextrose 5 % 50 mL IVPB     11/30/17 1804   11/30/17 1630  piperacillin-tazobactam (ZOSYN) IVPB 3.375 g  Status:  Discontinued     11/30/17 1701       Devices    LINES / TUBES:      Continuous Infusions: . dextrose    . heparin 1,350 Units/hr (12/05/17 1731)     Objective: Vitals:   12/05/17 2147 12/06/17 0542 12/06/17 0752 12/06/17 0847  BP: (!) 111/58 113/61 128/63 106/90  Pulse: 66 61 (!) 56 60  Resp: 15 18 15    Temp: 98 F (36.7 C) 99.1 F (37.3 C) 98.7 F (37.1 C)   TempSrc: Oral Oral    SpO2: 96% 91% 94%   Weight:  234 lb 4.8 oz (106.3 kg)    Height:        Intake/Output Summary (Last 24 hours) at 12/06/2017 0910 Last data filed at 12/06/2017 0600 Gross per 24 hour  Intake 270 ml  Output 750 ml  Net -480 ml   Filed Weights   12/04/17 1219 12/05/17 0555 12/06/17 0542  Weight: 227 lb 14.4 oz (103.4 kg) 229 lb 4.8 oz (104 kg) 234 lb 4.8 oz (106.3 kg)    Physical Exam:  General: A/O 4, No acute respiratory distress Neck:  Negative scars, masses, torticollis, lymphadenopathy, JVD Lungs: Clear to auscultation bilaterally without  wheezes or crackles Cardiovascular: Regular rate and rhythm without murmur gallop or rub normal S1 and S2 Abdomen: Positive suprapubic abdominal pain to palpation (secondary to inability to void), nondistended, positive soft, bowel sounds, no rebound, no ascites, no appreciable mass, positive left CVA tenderness mild Extremities: No significant cyanosis, clubbing, or edema bilateral lower extremities, multiple spontaneous bruises on lower extremities. Skin: Negative rashes,  lesions, ulcers Psychiatric:  Negative depression, negative anxiety, negative fatigue, negative mania  Central nervous system:  Cranial nerves II through XII intact, tongue/uvula midline, all extremities muscle strength 5/5, sensation intact throughout, negative dysarthria, negative expressive aphasia, negative receptive aphasia.    Data Reviewed: Care during the described time interval was provided by me .  I have reviewed this patient's available data, including medical history, events of note, physical examination, and all test results as part of my evaluation.   CBC: Recent Labs  Lab 11/30/17 1632  12/02/17 0356 12/02/17 0926 12/03/17 0309 12/03/17 1801 12/04/17 0224 12/05/17 0243  WBC 6.5   < > 6.9  --  7.3 8.6 8.7 7.3  NEUTROABS 5.6  --   --   --   --   --   --   --   HGB 8.8*   < > 7.4* 7.6* 7.7* 9.8* 9.5* 8.0*  HCT 26.7*   < > 22.2* 23.6* 23.3* 31.3* 30.0* 25.3*  MCV 90.8   < > 90.6  --  92.8 92.9 93.5 95.5  PLT 163   < > 126*  --  113* 116* 120* 112*   < > = values in this interval not displayed.   Basic Metabolic Panel: Recent Labs  Lab 12/01/17 0230 12/02/17 0356 12/02/17 1646 12/03/17 0309 12/04/17 0224 12/05/17 0243 12/05/17 1203  NA 138 144  --  147* 148* 144  --   K 4.1 3.3* 3.6 3.9 3.7 3.9 4.0  CL 111 111  --  113* 112* 111  --   CO2 10* 22  --  29 28 28   --   GLUCOSE 92 101*  --  100* 90 101*  --   BUN 160* 109*  --  54* 27* 14  --   CREATININE 10.57* 4.72*  --  1.87* 1.41* 1.44*  --   CALCIUM 7.7* 7.6*  --  7.8* 8.4* 7.8*  --   MG  --   --  1.3* 1.8 1.4* 1.7 1.7  PHOS  --  3.8  --   --   --   --   --    GFR: Estimated Creatinine Clearance: 47.8 mL/min (A) (by C-G formula based on SCr of 1.44 mg/dL (H)). Liver Function Tests: Recent Labs  Lab 11/30/17 1632 12/02/17 0356  AST 35  --   ALT 34  --   ALKPHOS 40  --   BILITOT 0.8  --   PROT 6.0*  --   ALBUMIN 3.2* 2.6*   No results for input(s): LIPASE, AMYLASE in the last 168 hours. No  results for input(s): AMMONIA in the last 168 hours. Coagulation Profile: Recent Labs  Lab 12/01/17 1527 12/02/17 0356 12/03/17 0309 12/04/17 0224 12/05/17 0243  INR 2.11 1.55 1.36 1.27 1.31   Cardiac Enzymes: No results for input(s): CKTOTAL, CKMB, CKMBINDEX, TROPONINI in the last 168 hours. BNP (last 3 results) No results for input(s): PROBNP in the last 8760 hours. HbA1C: No results for input(s): HGBA1C in the last 72 hours. CBG: No results for input(s): GLUCAP in the last 168 hours. Lipid Profile: Recent  Labs    12/05/17 1203  CHOL 94  HDL 47  LDLCALC 36  TRIG 54  CHOLHDL 2.0   Thyroid Function Tests: No results for input(s): TSH, T4TOTAL, FREET4, T3FREE, THYROIDAB in the last 72 hours. Anemia Panel: No results for input(s): VITAMINB12, FOLATE, FERRITIN, TIBC, IRON, RETICCTPCT in the last 72 hours. Urine analysis:    Component Value Date/Time   COLORURINE YELLOW 11/30/2017 [employer] 11/30/2017 1653   LABSPEC 1.013 11/30/2017 1653   PHURINE 5.0 11/30/2017 1653   GLUCOSEU NEGATIVE 11/30/2017 1653   GLUCOSEU NEGATIVE 11/10/2017 1216   HGBUR MODERATE (A) 11/30/2017 1653   BILIRUBINUR NEGATIVE 11/30/2017 1653   KETONESUR NEGATIVE 11/30/2017 1653   PROTEINUR NEGATIVE 11/30/2017 1653   UROBILINOGEN 0.2 11/10/2017 1216   NITRITE NEGATIVE 11/30/2017 1653   LEUKOCYTESUR NEGATIVE 11/30/2017 1653   Sepsis Labs: @LABRCNTIP (procalcitonin:4,lacticidven:4)  ) Recent Results (from the past 240 hour(s))  Urine culture     Status: None   Collection Time: 11/30/17  4:17 PM  Result Value Ref Range Status   Specimen Description URINE, RANDOM  Final   Special Requests NONE  Final   Culture NO GROWTH  Final   Report Status 12/01/2017 FINAL  Final  Blood Culture (routine x 2)     Status: None   Collection Time: 11/30/17  4:35 PM  Result Value Ref Range Status   Specimen Description BLOOD LEFT ANTECUBITAL  Final   Special Requests   Final    BOTTLES DRAWN  AEROBIC AND ANAEROBIC Blood Culture results may not be optimal due to an excessive volume of blood received in culture bottles   Culture NO GROWTH 5 DAYS  Final   Report Status 12/05/2017 FINAL  Final  Blood Culture (routine x 2)     Status: None   Collection Time: 11/30/17  5:22 PM  Result Value Ref Range Status   Specimen Description BLOOD BLOOD LEFT WRIST  Final   Special Requests   Final    Blood Culture adequate volume BOTTLES DRAWN AEROBIC AND ANAEROBIC   Culture NO GROWTH 5 DAYS  Final   Report Status 12/05/2017 FINAL  Final  C difficile quick scan w PCR reflex     Status: None   Collection Time: 11/30/17  8:50 PM  Result Value Ref Range Status   C Diff antigen NEGATIVE NEGATIVE Final   C Diff toxin NEGATIVE NEGATIVE Final   C Diff interpretation No C. difficile detected.  Final  MRSA PCR Screening     Status: None   Collection Time: 12/01/17  3:19 PM  Result Value Ref Range Status   MRSA by PCR NEGATIVE NEGATIVE Final    Comment:        The GeneXpert MRSA Assay (FDA approved for NASAL specimens only), is one component of a comprehensive MRSA colonization surveillance program. It is not intended to diagnose MRSA infection nor to guide or monitor treatment for MRSA infections.          Radiology Studies: No results found.      Scheduled Meds: . amiodarone  200 mg Oral Daily  . carvedilol  3.125 mg Oral BID  . metoCLOPramide  10 mg Oral Once  . rosuvastatin  10 mg Oral Daily  . spironolactone  25 mg Oral Daily  . Warfarin - Pharmacist Dosing Inpatient   Does not apply q1800   Continuous Infusions: . dextrose    . heparin 1,350 Units/hr (12/05/17 1731)     LOS: 6 days  Time spent: 40 minutes    Moses Odoherty, Geraldo Docker, MD [facility] Pager [phone]   If 7PM-7AM, please contact night-coverage [email or web address] Password [email or web address] 12/06/2017, 9:10 AM

## 2017-12-06 NOTE — Progress Notes (Signed)
ANTICOAGULATION CONSULT NOTE - Initial Consult  Pharmacy Consult for heparin and warfarin Indication: atrial fibrillation  Allergies  Allergen Reactions  . Ace Inhibitors Palpitations   Patient Measurements: Height: 5\' 5"  (165.1 cm) Weight: 234 lb 4.8 oz (106.3 kg) IBW/kg (Calculated) : 57 Heparin Dosing Weight: 81  Vital Signs: Temp: 98.7 F (37.1 C) (01/13 0752) Temp Source: Oral (01/13 0542) BP: 106/90 (01/13 0847) Pulse Rate: 60 (01/13 0847)  Labs: Recent Labs    12/04/17 0224 12/05/17 0243 12/05/17 1203 12/06/17 0810  HGB 9.5* 8.0*  --  8.9*  HCT 30.0* 25.3*  --  29.1*  PLT 120* 112*  --  161  LABPROT 15.8* 16.2*  --  16.5*  INR 1.27 1.31  --  1.35  HEPARINUNFRC  --  <0.10* 0.31 0.49  CREATININE 1.41* 1.44*  --   --    Estimated Creatinine Clearance: 47.8 mL/min (A) (by C-G formula based on SCr of 1.44 mg/dL (H)).  Assessment: 67 y/o female with h/o afib and stroke on warfarin PTA admitted 11/30/2017 with obstructive uropathy, hematuria, AKI and N/V. Also had BRBPR s/p colonoscopy and presumed self-limited diverticular bleed. Admit INR >10 and was reversed with vitamin K 5 mg PO and 5 mg IV. Pharmacy consulted to resume warfarin and bridge with IV heparin. INR remains below goal at 1.35 this AM. Heparin level near maximum goal at 0.49. Hgb 8.9, pltc WNL. No bleeding noted.  PTA warfarin dose: 3 mg daily except 4.5 mg Wed (per warfarin clinic)  Goal of Therapy:  INR 2-3 Heparin level 0.3-0.5 units/ml due to recent bleeding Monitor platelets by anticoagulation protocol: Yes   Plan:  Decrease heparin drip to 1300 units/hr  Warfarin 5 mg PO x1 tonight Daily heparin level, INR, and CBC Monitor for s/sx of bleeding  Erin N. Gerarda Fraction, PharmD PGY1 Pharmacy Resident Pager: [phone] 12/06/2017 9:49 AM

## 2017-12-06 NOTE — Progress Notes (Signed)
Patient has not voided since in and out cath this AM at 0600.  Bladder scan completed =144cc in bladder.

## 2017-12-06 NOTE — Progress Notes (Signed)
Pt unable to void 8 hours post foley removal. Encouraged and sat on [facility] for few minutes but unsuccessful. Reports not "drinking much" Bladder scan obtained in the amount of about  250 ml. Provided additional time to void. Will continue to monitor. Elita Boone, BSN, RN.

## 2017-12-07 DIAGNOSIS — I481 Persistent atrial fibrillation: Secondary | ICD-10-CM

## 2017-12-07 LAB — CBC
HEMATOCRIT: 27.1 % — AB (ref 36.0–46.0)
HEMOGLOBIN: 8.3 g/dL — AB (ref 12.0–15.0)
MCH: 30.3 pg (ref 26.0–34.0)
MCHC: 30.6 g/dL (ref 30.0–36.0)
MCV: 98.9 fL (ref 78.0–100.0)
Platelets: 158 10*3/uL (ref 150–400)
RBC: 2.74 MIL/uL — ABNORMAL LOW (ref 3.87–5.11)
RDW: 15.5 % (ref 11.5–15.5)
WBC: 8.2 10*3/uL (ref 4.0–10.5)

## 2017-12-07 LAB — BASIC METABOLIC PANEL
ANION GAP: 5 (ref 5–15)
BUN: 7 mg/dL (ref 6–20)
CALCIUM: 8.2 mg/dL — AB (ref 8.9–10.3)
CHLORIDE: 111 mmol/L (ref 101–111)
CO2: 28 mmol/L (ref 22–32)
CREATININE: 1.28 mg/dL — AB (ref 0.44–1.00)
GFR calc non Af Amer: 43 mL/min — ABNORMAL LOW (ref >60)
GFR, EST AFRICAN AMERICAN: 50 mL/min — AB (ref >60)
GLUCOSE: 86 mg/dL (ref 65–99)
Potassium: 4.3 mmol/L (ref 3.5–5.1)
Sodium: 144 mmol/L (ref 135–145)

## 2017-12-07 LAB — HEPARIN LEVEL (UNFRACTIONATED)
HEPARIN UNFRACTIONATED: 0.26 [IU]/mL — AB (ref 0.30–0.70)
Heparin Unfractionated: 0.3 IU/mL (ref 0.30–0.70)

## 2017-12-07 LAB — MAGNESIUM: Magnesium: 1.4 mg/dL — ABNORMAL LOW (ref 1.7–2.4)

## 2017-12-07 LAB — PROTIME-INR
INR: 1.58
PROTHROMBIN TIME: 18.7 s — AB (ref 11.4–15.2)

## 2017-12-07 MED ORDER — WARFARIN SODIUM 5 MG PO TABS
5.0000 mg | ORAL_TABLET | Freq: Once | ORAL | Status: AC
  Administered 2017-12-07: 5 mg via ORAL
  Filled 2017-12-07: qty 1

## 2017-12-07 MED ORDER — ISOSORB DINITRATE-HYDRALAZINE 20-37.5 MG PO TABS
1.0000 | ORAL_TABLET | Freq: Two times a day (BID) | ORAL | Status: DC
  Administered 2017-12-07 – 2017-12-08 (×2): 1 via ORAL
  Filled 2017-12-07 (×3): qty 1

## 2017-12-07 MED ORDER — RANOLAZINE ER 500 MG PO TB12
500.0000 mg | ORAL_TABLET | Freq: Two times a day (BID) | ORAL | Status: DC
  Administered 2017-12-07 – 2017-12-08 (×2): 500 mg via ORAL
  Filled 2017-12-07 (×3): qty 1

## 2017-12-07 MED ORDER — PATIENT'S GUIDE TO USING COUMADIN BOOK
Freq: Once | Status: AC
  Administered 2017-12-07: 22:00:00
  Filled 2017-12-07: qty 1

## 2017-12-07 MED ORDER — MAGNESIUM SULFATE 4 GM/100ML IV SOLN
4.0000 g | Freq: Once | INTRAVENOUS | Status: AC
  Administered 2017-12-07: 4 g via INTRAVENOUS
  Filled 2017-12-07: qty 100

## 2017-12-07 MED ORDER — FUROSEMIDE 20 MG PO TABS
20.0000 mg | ORAL_TABLET | Freq: Every day | ORAL | Status: DC
  Administered 2017-12-07 – 2017-12-08 (×2): 20 mg via ORAL
  Filled 2017-12-07 (×2): qty 1

## 2017-12-07 NOTE — Progress Notes (Addendum)
PROGRESS NOTE    Patricia Wagner  KGM:[id number] DOB: 1951-11-13 DOA: 11/30/2017 PCP: Biagio Borg, MD   Brief Narrative:  67 year old female with PMH of persistent A. fib on Coumadin, status post multiple failed DCCV's, chronic systolic CHF/NICM, CAD, HTN, HLD, CVA, stage III chronic kidney disease, COPD, suspected sleep apnea, presented with over a week of worsening inability to urinate. She reported dribbling only, with the inability to fully empty her bladder, associated w/ suprapubic pain. In the ED a foley returned over 2 liters of urine.  She was also noted to have AKI with creatinine over 14. Nephrology consulted, AKI improved and they signed off.   Assessment & Plan:   Principal Problem:   Acute renal failure superimposed on chronic kidney disease (HCC) Active Problems:   Essential hypertension   Raynaud's syndrome   Diverticulosis of colon with hemorrhage   CAD in native artery, s/p cardiac cath with non obstructive CAD   CHF (congestive heart failure) ([place])   Cerebrovascular accident (CVA) due to embolism of left cerebellar artery (HCC)   Atrial fibrillation ([place])   Subtherapeutic international normalized ratio (INR)   Diarrhea   Acute renal failure on CKD Stage 3 -Renal ultrasound: Negative hydronephrosis - Nephrology was consulted and felt that this was due to obstructive/retention pathology - Foley catheter was placed for urinary retention. She was hydrated with IV fluids. - Creatinine has returned to baseline. Acute kidney injury resolved.  Obstructive uropathy/Urine Retention -Unclear etiology -CT abdomen pelvis nondiagnostic see results below -Patient failed voiding trial. After removal of Foley began to have retention overnight which required in and out cath multiple times. - Flomax 0.4 MG daily was started. - I discussed with Dr. Manfred Arch, [facility] on call 1/14 recommended discharging patient (not much to offer from Urology standpoint while inpatient) with  Foley catheter and outpatient follow-up with them in 2 weeks.  LEFT Adrenal mass -Incidental finding on CT scan see results below. Counseled patient that she will need to follow-up with her PCP. PCP would need to possibly have endocrinologist evaluate as outpatient.   Chronic Atrial fib on Coumadin - Multiple failed DCCV - Per EMR scheduled for another ablation with Dr. Curt Bears EP this month. As per cardiology, she absolutely cannot tolerate A. fib with recurrent cardiogenic shock when A. fib recurs. - Set up for A fib ablation 12/11/2017.  However  pre-operative cardiac CT has been canceled so not clear that she is actually going to undergo the ablation,  - Continue Amiodarone 200 mg daily and Coreg 3.125 mg BID. Resume Ranexa. -Consult dietitian for diet affecting Coumadin >as per discussion with pharmacy, patient reportedly eats lots of peanuts which may be affecting her INR and has been counseled by them. - Patient currently on IV heparin bridge and Coumadin. Discussed with Cardiology, since prior history of stroke, would continue bridge until INR at least greater than 1.8.  Chronic systolic CHF/Nonischemic Cardiomyopathy - Clinically appears compensated. --900 ML since admission. - Continue Aldactone. - Continue to monitor strict intake and output and daily weights.  - Now that acute kidney injury has resolved, hypotension resolved, resume BiDil, Lasix and consider resuming Entresto discharge (refer to [name], Cardiology note 1/7)  Acute blood loss anemia complicating anemia of chronic disease. - Recent baseline hemoglobin probably in the mid 8 g per DL range. - Hemoglobin dropped to a low of 7.4 on 1/9. - Status post 2 unit PRBC transfusion. - Hemoglobin stable in the 8 g range. Follow CBCs periodically.  Essential HTN - Controlled.  Lower GI bleed/Diverticulosis - BRBPR -1/7 fecal occult positive (INR supratherapeutic at the time >10) - [facility] was consulted. Patient  underwent colonoscopy on 1/11 which showed diverticulosis in the entire examined colon and GI suspected that she had presumed self-limited diverticular bleed. GI cleared to resume Coumadin. Repeat colonoscopy in 5-10 years for surveillance. Follow-up with GI as needed. - Rectal bleeding resolved.    Coumadin-induced coagulopathy - INR was >10 on admission. This was reversed with vitamin K.  - Patient is on IV heparin bridge and Coumadin anticoagulation. Given prior history of stroke, continue heparin bridge until INR >1.8 and then can follow-up outpatient.    HLD  - Continue statins.   Hypokalemia - Replaced.   Hypomagnesemia - Magnesium 1.4 on 1/14. Replace and follow.     DVT prophylaxis: Coumadin with IV heparin bridge.  Code Status: Full Family Communication: None Disposition Plan: DC home when INR > 1.8, possibly in the next 1-2 days.   Consultants:  Nephrology CHF team GI   Procedures/Significant Events:   1/9 CT abdomen and pelvis WO contrast:No urolithiasis. Foley catheter in place with bladder wall thickening. Prominence of both renal collecting systems without frank hydronephrosis. No evidence of obstructive uropathy or bladder outlet obstruction or obstructive uropathy. -Colonic diverticulosis without diverticulitis. -Bilateral renal cysts. - Incidental findings of left adrenal adenoma right adrenal adenoma 1/11 S/P colonoscopy:The examined portion of the ileum was normal. - Diverticulosis in the entire examined colon. Presumed self-limited diverticular bleed    Antimicrobials: - None  Subjective: Denies rectal bleeding. No abdominal pain, nausea or vomiting. Tolerating diet. As per discussion with RN, failed voiding trial over the weekend and Foley catheter had to be reinserted. Patient not very keen on going home with catheter but advised her that that is probably the best option until outpatient urology follow-up. She verbalized understanding.    Objective: Vitals:   12/07/17 0520 12/07/17 0535 12/07/17 0818 12/07/17 1208  BP: 124/80  116/77 127/77  Pulse: 65   61  Resp: (!) 22  15 (!) 27  Temp: 99.3 F (37.4 C) 98 F (36.7 C) 98 F (36.7 C) 98.8 F (37.1 C)  TempSrc: Oral  Oral Oral  SpO2: 96%     Weight: 106.9 kg (235 lb 9.6 oz)     Height:        Physical Exam:  General: Middle-aged female, moderately built and nourished lying comfortably propped up in bed.  Lungs: Clear to auscultation. No increased work of breathing.  Cardiovascular: S1 and S2 heard, RRR. No JVD, murmurs. Trace bilateral ankle edema. Telemetry: Sinus rhythm. 4 beat NSVT.  Abdomen: nondistended, soft and nontender. Normal bowel sounds heard.  Genitourinary: Foley catheter in place.  Extremities: symmetric normal power. Skin: Negative rashes, lesions, ulcers Central nervous system:  Alert and oriented 3. No focal neurological deficits.    CBC: Recent Labs  Lab 11/30/17 1632  12/04/17 0224 12/05/17 0243 12/06/17 0810 12/06/17 0952 12/07/17 0727  WBC 6.5   < > 8.7 7.3 8.1 8.2 8.2  NEUTROABS 5.6  --   --   --   --   --   --   HGB 8.8*   < > 9.5* 8.0* 8.9* 8.5* 8.3*  HCT 26.7*   < > 30.0* 25.3* 29.1* 27.8* 27.1*  MCV 90.8   < > 93.5 95.5 98.0 97.5 98.9  PLT 163   < > 120* 112* 161 146* 158   < > = values in  this interval not displayed.   Basic Metabolic Panel: Recent Labs  Lab 12/02/17 0356  12/04/17 0224 12/05/17 0243 12/05/17 1203 12/06/17 0810 12/06/17 0952 12/07/17 0727  NA 144   < > 148* 144  --  142 142 144  K 3.3*   < > 3.7 3.9 4.0 4.1 4.1 4.3  CL 111   < > 112* 111  --  109 109 111  CO2 22   < > 28 28  --  26 25 28   GLUCOSE 101*   < > 90 101*  --  90 113* 86  BUN 109*   < > 27* 14  --  8 8 7   CREATININE 4.72*   < > 1.41* 1.44*  --  1.34* 1.38* 1.28*  CALCIUM 7.6*   < > 8.4* 7.8*  --  8.4* 8.3* 8.2*  MG  --    < > 1.4* 1.7 1.7 1.7 1.7 1.4*  PHOS 3.8  --   --   --   --   --   --   --    < > = values in this interval  not displayed.   GFR: Estimated Creatinine Clearance: 54 mL/min (A) (by C-G formula based on SCr of 1.28 mg/dL (H)). Liver Function Tests: Recent Labs  Lab 11/30/17 1632 12/02/17 0356  AST 35  --   ALT 34  --   ALKPHOS 40  --   BILITOT 0.8  --   PROT 6.0*  --   ALBUMIN 3.2* 2.6*   Coagulation Profile: Recent Labs  Lab 12/03/17 0309 12/04/17 0224 12/05/17 0243 12/06/17 0810 12/07/17 0727  INR 1.36 1.27 1.31 1.35 1.58   Lipid Profile: Recent Labs    12/05/17 1203  CHOL 94  HDL 47  LDLCALC 36  TRIG 54  CHOLHDL 2.0   Urine analysis:    Component Value Date/Time   COLORURINE YELLOW 11/30/2017 [place] 11/30/2017 1653   LABSPEC 1.013 11/30/2017 1653   PHURINE 5.0 11/30/2017 1653   GLUCOSEU NEGATIVE 11/30/2017 1653   GLUCOSEU NEGATIVE 11/10/2017 1216   HGBUR MODERATE (A) 11/30/2017 1653   BILIRUBINUR NEGATIVE 11/30/2017 1653   KETONESUR NEGATIVE 11/30/2017 1653   PROTEINUR NEGATIVE 11/30/2017 1653   UROBILINOGEN 0.2 11/10/2017 1216   NITRITE NEGATIVE 11/30/2017 1653   LEUKOCYTESUR NEGATIVE 11/30/2017 1653    Recent Results (from the past 240 hour(s))  Urine culture     Status: None   Collection Time: 11/30/17  4:17 PM  Result Value Ref Range Status   Specimen Description URINE, RANDOM  Final   Special Requests NONE  Final   Culture NO GROWTH  Final   Report Status 12/01/2017 FINAL  Final  Blood Culture (routine x 2)     Status: None   Collection Time: 11/30/17  4:35 PM  Result Value Ref Range Status   Specimen Description BLOOD LEFT ANTECUBITAL  Final   Special Requests   Final    BOTTLES DRAWN AEROBIC AND ANAEROBIC Blood Culture results may not be optimal due to an excessive volume of blood received in culture bottles   Culture NO GROWTH 5 DAYS  Final   Report Status 12/05/2017 FINAL  Final  Blood Culture (routine x 2)     Status: None   Collection Time: 11/30/17  5:22 PM  Result Value Ref Range Status   Specimen Description BLOOD  BLOOD LEFT WRIST  Final   Special Requests   Final    Blood Culture adequate volume  BOTTLES DRAWN AEROBIC AND ANAEROBIC   Culture NO GROWTH 5 DAYS  Final   Report Status 12/05/2017 FINAL  Final  C difficile quick scan w PCR reflex     Status: None   Collection Time: 11/30/17  8:50 PM  Result Value Ref Range Status   C Diff antigen NEGATIVE NEGATIVE Final   C Diff toxin NEGATIVE NEGATIVE Final   C Diff interpretation No C. difficile detected.  Final  MRSA PCR Screening     Status: None   Collection Time: 12/01/17  3:19 PM  Result Value Ref Range Status   MRSA by PCR NEGATIVE NEGATIVE Final    Comment:        The GeneXpert MRSA Assay (FDA approved for NASAL specimens only), is one component of a comprehensive MRSA colonization surveillance program. It is not intended to diagnose MRSA infection nor to guide or monitor treatment for MRSA infections.          Radiology Studies: No results found.      Scheduled Meds: . amiodarone  200 mg Oral Daily  . carvedilol  3.125 mg Oral BID  . metoCLOPramide  10 mg Oral Once  . patient's guide to using coumadin book   Does not apply Once  . rosuvastatin  10 mg Oral Daily  . spironolactone  25 mg Oral Daily  . tamsulosin  0.4 mg Oral QPC supper  . warfarin  5 mg Oral ONCE-1800  . Warfarin - Pharmacist Dosing Inpatient   Does not apply q1800   Continuous Infusions: . dextrose    . heparin 1,350 Units/hr (12/07/17 1130)  . magnesium sulfate 1 - 4 g bolus IVPB 4 g (12/07/17 1313)     LOS: 7 days    Time spent: 40 minutes   Vernell Leep, MD, FACP, [facility]. [facility] Pager [phone]  If 7PM-7AM, please contact night-coverage [email or web address] Password [email or web address] 12/07/2017, 3:30 PM

## 2017-12-07 NOTE — Progress Notes (Signed)
ANTICOAGULATION CONSULT NOTE - Follow Up Consult  Pharmacy Consult for Heparin and Coumadin Indication: atrial fibrillation  Allergies  Allergen Reactions  . Ace Inhibitors Palpitations    Patient Measurements: Height: 5\' 5"  (165.1 cm) Weight: 235 lb 9.6 oz (106.9 kg) IBW/kg (Calculated) : 57 Heparin Dosing Weight: 81 kg  Vital Signs: Temp: 98 F (36.7 C) (01/14 0818) Temp Source: Oral (01/14 0818) BP: 116/77 (01/14 0818) Pulse Rate: 65 (01/14 0520)  Labs: Recent Labs    12/05/17 0243 12/05/17 1203 12/06/17 0810 12/06/17 0952 12/07/17 0727  HGB 8.0*  --  8.9* 8.5* 8.3*  HCT 25.3*  --  29.1* 27.8* 27.1*  PLT 112*  --  161 146* 158  LABPROT 16.2*  --  16.5*  --  18.7*  INR 1.31  --  1.35  --  1.58  HEPARINUNFRC <0.10* 0.31 0.49  --  0.26*  CREATININE 1.44*  --  1.34* 1.38* 1.28*    Estimated Creatinine Clearance: 54 mL/min (A) (by C-G formula based on SCr of 1.28 mg/dL (H)).  Assessment:  67 y/o female with h/o afib and stroke on warfarin PTA admitted 11/30/2017 with obstructive uropathy, hematuria, AKI and N/V. Also had BRBPR s/p colonoscopy on 1/11 and presumed self-limited diverticular bleed. Admit INR >10 and was reversed with vitamin K 5 mg PO on 1/7 and 5 mg IV on 1/8. Pharmacy consulted to resume warfarin and bridge with IV heparin on 1/11.    Heparin level is now just below goal (0.26) on 1300 units/hr.   INR up to 1.58 after Coumadin 5 mg daily x 3 days.   Patient reports no bleeding.    Home Coumadin regimen: 3 mg daily except 4.5 mg on Wednesdays.   INR >10 on admit 1/7.  Decreased PO intake prior to admit may have contributed.   On amiodarone 200 mg daily as prior to admission.    Goal of Therapy:  INR 2-3 Heparin level 0.3-0.5 units/ml due to recent bleeding Monitor platelets by anticoagulation protocol: Yes   Plan:   Increase heparin drip to 1350 units/hr.  Heparin level ~6 hrs after rate change.  Coumadin 5 mg again today.  Daily heparin level,  PT/INR and CBC.  Discussed Coumadin use including potential drug-drug and drug-food interactions. Patient familiar with Vitamin K contents of foods. Coumadin booklet with info provided.  Arty Baumgartner, [place] Pager: [phone] 12/07/2017,11:31 AM

## 2017-12-07 NOTE — Progress Notes (Signed)
ANTICOAGULATION CONSULT NOTE - Follow Up Consult  Pharmacy Consult for Heparin and Coumadin Indication: atrial fibrillation  Allergies  Allergen Reactions  . Ace Inhibitors Palpitations    Patient Measurements: Height: 5\' 5"  (165.1 cm) Weight: 235 lb 9.6 oz (106.9 kg) IBW/kg (Calculated) : 57 Heparin Dosing Weight: 81 kg  Vital Signs: Temp: 98.2 F (36.8 C) (01/14 2032) Temp Source: Oral (01/14 2032) BP: 99/50 (01/14 2032) Pulse Rate: 62 (01/14 2032)  Labs: Recent Labs    12/05/17 0243  12/06/17 0810 12/06/17 0952 12/07/17 0727 12/07/17 1825  HGB 8.0*  --  8.9* 8.5* 8.3*  --   HCT 25.3*  --  29.1* 27.8* 27.1*  --   PLT 112*  --  161 146* 158  --   LABPROT 16.2*  --  16.5*  --  18.7*  --   INR 1.31  --  1.35  --  1.58  --   HEPARINUNFRC <0.10*   < > 0.49  --  0.26* 0.30  CREATININE 1.44*  --  1.34* 1.38* 1.28*  --    < > = values in this interval not displayed.    Estimated Creatinine Clearance: 54 mL/min (A) (by C-G formula based on SCr of 1.28 mg/dL (H)).  Assessment:  67 y/o female with h/o afib and stroke on warfarin PTA admitted 11/30/2017 with obstructive uropathy, hematuria, AKI and N/V. Also had BRBPR s/p colonoscopy on 1/11 and presumed self-limited diverticular bleed. Admit INR >10 and was reversed with vitamin K 5 mg PO on 1/7 and 5 mg IV on 1/8. Pharmacy consulted to resume warfarin and bridge with IV heparin on 1/11.  Heparin level = 0.3 this evening  Goal of Therapy:  INR 2-3 Heparin level 0.3-0.5 units/ml due to recent bleeding Monitor platelets by anticoagulation protocol: Yes   Plan:  Increase heparin to 1400 units / hr Follow up AM labs  Thank you Anette Guarneri, PharmD [phone]   12/07/2017,8:42 PM

## 2017-12-07 NOTE — Discharge Instructions (Addendum)
Information on my medicine - Coumadin®   (Warfarin) ° °This medication education was reviewed with me or my healthcare representative as part of my discharge preparation.  The pharmacist that spoke with me during my hospital stay was:  Egan, Theresa Donovan, RPH ° °Why was Coumadin prescribed for you? °Coumadin was prescribed for you because you have a blood clot or a medical condition that can cause an increased risk of forming blood clots. Blood clots can cause serious health problems by blocking the flow of blood to the heart, lung, or brain. Coumadin can prevent harmful blood clots from forming. °As a reminder your indication for Coumadin is:   Stroke Prevention Because Of Atrial Fibrillation ° °What test will check on my response to Coumadin? °While on Coumadin (warfarin) you will need to have an INR test regularly to ensure that your dose is keeping you in the desired range. The INR (international normalized ratio) number is calculated from the result of the laboratory test called prothrombin time (PT). ° °If an INR APPOINTMENT HAS NOT ALREADY BEEN MADE FOR YOU please schedule an appointment to have this lab work done by your health care provider within 7 days. °Your INR goal is usually a number between:  2 to 3 or your provider may give you a more narrow range like 2-2.5.  Ask your health care provider during an office visit what your goal INR is. ° °What  do you need to  know  About  COUMADIN? °Take Coumadin (warfarin) exactly as prescribed by your healthcare provider about the same time each day.  DO NOT stop taking without talking to the doctor who prescribed the medication.  Stopping without other blood clot prevention medication to take the place of Coumadin may increase your risk of developing a new clot or stroke.  Get refills before you run out. ° °What do you do if you miss a dose? °If you miss a dose, take it as soon as you remember on the same day then continue your regularly scheduled regimen the  next day.  Do not take two doses of Coumadin at the same time. ° °Important Safety Information °A possible side effect of Coumadin (Warfarin) is an increased risk of bleeding. You should call your healthcare provider right away if you experience any of the following: °? Bleeding from an injury or your nose that does not stop. °? Unusual colored urine (red or dark brown) or unusual colored stools (red or black). °? Unusual bruising for unknown reasons. °? A serious fall or if you hit your head (even if there is no bleeding). ° °Some foods or medicines interact with Coumadin® (warfarin) and might alter your response to warfarin. To help avoid this: °? Eat a balanced diet, maintaining a consistent amount of Vitamin K. °? Notify your provider about major diet changes you plan to make. °? Avoid alcohol or limit your intake to 1 drink for women and 2 drinks for men per day. °(1 drink is 5 oz. wine, 12 oz. beer, or 1.5 oz. liquor.) ° °Make sure that ANY health care provider who prescribes medication for you knows that you are taking Coumadin (warfarin).  Also make sure the healthcare provider who is monitoring your Coumadin knows when you have started a new medication including herbals and non-prescription products. ° °Coumadin® (Warfarin)  Major Drug Interactions  °Increased Warfarin Effect Decreased Warfarin Effect  °Alcohol (large quantities) °Antibiotics (esp. Septra/Bactrim, Flagyl, Cipro) °Amiodarone (Cordarone) °Aspirin (ASA) °Cimetidine (Tagamet) °Megestrol (Megace) °NSAIDs (ibuprofen,   naproxen, etc.) Piroxicam (Feldene) Propafenone (Rythmol SR) Propranolol (Inderal) Isoniazid (INH) Posaconazole (Noxafil) Barbiturates (Phenobarbital) Carbamazepine (Tegretol) Chlordiazepoxide (Librium) Cholestyramine (Questran) Griseofulvin Oral Contraceptives Rifampin Sucralfate (Carafate) Vitamin K   Coumadin (Warfarin) Major Herbal Interactions  Increased Warfarin Effect Decreased Warfarin Effect   Garlic Ginseng Ginkgo biloba Coenzyme Q10 Green tea [place] wort    Coumadin (Warfarin) FOOD Interactions  Eat a consistent number of servings per week of foods HIGH in Vitamin K (1 serving =  cup)  Collards (cooked, or boiled & drained) Kale (cooked, or boiled & drained) Mustard greens (cooked, or boiled & drained) Parsley *serving size only =  cup Spinach (cooked, or boiled & drained) Swiss chard (cooked, or boiled & drained) Turnip greens (cooked, or boiled & drained)  Eat a consistent number of servings per week of foods MEDIUM-HIGH in Vitamin K (1 serving = 1 cup)  Asparagus (cooked, or boiled & drained) Broccoli (cooked, boiled & drained, or raw & chopped) Brussel sprouts (cooked, or boiled & drained) *serving size only =  cup Lettuce, raw (green leaf, endive, romaine) Spinach, raw Turnip greens, raw & chopped   These websites have more information on Coumadin (warfarin):  FailFactory.se; VeganReport.com.au;   Indwelling Urinary Catheter Insertion, Care After This sheet gives you information about how to care for yourself after your procedure. Your health care provider may also give you more specific instructions. If you have problems or questions, contact your health care provider. What can I expect after the procedure? After the procedure, it is common to have:  Slight discomfort around your urethra where the catheter enters your body.  Follow these instructions at home:  Keep the drainage bag at or below the level of your bladder. Doing this ensures that urine can only drain out, not back into your body.  Secure the catheter tubing and drainage bag to your leg or thigh to keep it from moving.  Check the catheter tubing regularly to make sure there are no kinks or blockages.  Take showers daily to keep the catheter clean. Do not take a bath.  Do not pull on your catheter or try to remove it.  Disconnect the tubing and drainage bag as  little as possible.  Empty the drainage bag every 2-4 hours, or more often if needed. Do not let the bag get completely full.  Wash your hands with soap and water before and after touching the catheter, tubing, or drainage bag.  Do not let the drainage bag or catheter tubing touch the floor.  Drink enough fluids to keep your urine clear or pale yellow, or as told by your health care provider. Contact a health care provider if:  Urine stops flowing into the drainage bag.  You feel pain or pressure in the bladder area.  You have back pain.  Your catheter gets clogged.  Your catheter starts to leak.  Your urine looks cloudy.  Your drainage bag or tubing looks dirty.  You notice a bad smell when emptying your drainage bag. Get help right away if:  You have a fever or chills.  You have severe pain in your back or your lower abdomen.  You have warmth, redness, swelling, or pain in the urethra area.  You notice blood in your urine.  Your catheter gets pulled out. Summary  Do not pull on your catheter or try to remove it.  Keep the drainage bag at or below the level of your bladder, but do not let the drainage bag or catheter  tubing touch the floor.  Wash your hands with soap and water before and after touching the catheter, tubing, or drainage bag.  Contact your health care provider if you have a fever, chills, or any other signs of infection. This information is not intended to replace advice given to you by your health care provider. Make sure you discuss any questions you have with your health care provider. Document Released: 12/20/2016 Document Revised: 12/20/2016 Document Reviewed: 12/20/2016 Elsevier Interactive Patient Education  2018 [place] [employer]-healthy meal planning includes:  Limiting unhealthy fats.  Increasing healthy fats.  Making other small dietary changes.  You may need to talk with your doctor or a diet  specialist (dietitian) to create an eating plan that is right for you. What types of fat should I choose?  Choose healthy fats. These include olive oil and canola oil, flaxseeds, walnuts, almonds, and seeds.  Eat more omega-3 fats. These include salmon, mackerel, sardines, tuna, flaxseed oil, and ground flaxseeds. Try to eat fish at least twice each week.  Limit saturated fats. ? Saturated fats are often found in animal products, such as meats, butter, and cream. ? Plant sources of saturated fats include palm oil, palm kernel oil, and coconut oil.  Avoid foods with partially hydrogenated oils in them. These include stick margarine, some tub margarines, cookies, crackers, and other baked goods. These contain trans fats. What general guidelines do I need to follow?  Check food labels carefully. Identify foods with trans fats or high amounts of saturated fat.  Fill one half of your plate with vegetables and green salads. Eat 4-5 servings of vegetables per day. A serving of vegetables is: ? 1 cup of raw leafy vegetables. ?  cup of raw or cooked cut-up vegetables. ?  cup of vegetable juice.  Fill one fourth of your plate with whole grains. Look for the word "whole" as the first word in the ingredient list.  Fill one fourth of your plate with lean protein foods.  Eat 4-5 servings of fruit per day. A serving of fruit is: ? One medium whole fruit. ?  cup of dried fruit. ?  cup of fresh, frozen, or canned fruit. ?  cup of 100% fruit juice.  Eat more foods that contain soluble fiber. These include apples, broccoli, carrots, beans, peas, and barley. Try to get 20-30 g of fiber per day.  Eat more home-cooked food. Eat less restaurant, buffet, and fast food.  Limit or avoid alcohol.  Limit foods high in starch and sugar.  Avoid fried foods.  Avoid frying your food. Try baking, boiling, grilling, or broiling it instead. You can also reduce fat by: ? Removing the skin from  poultry. ? Removing all visible fats from meats. ? Skimming the fat off of stews, soups, and gravies before serving them. ? Steaming vegetables in water or broth.  Lose weight if you are overweight.  Eat 4-5 servings of nuts, legumes, and seeds per week: ? One serving of dried beans or legumes equals  cup after being cooked. ? One serving of nuts equals 1 ounces. ? One serving of seeds equals  ounce or one tablespoon.  You may need to keep track of how much salt or sodium you eat. This is especially true if you have high blood pressure. Talk with your doctor or dietitian to get more information. What foods can I eat? Grains Breads, including [place], white, pita, wheat, raisin, rye, oatmeal, and [place]. Tortillas that  are neither fried nor made with lard or trans fat. Low-fat rolls, including hotdog and hamburger buns and English muffins. Biscuits. Muffins. Waffles. Pancakes. Light popcorn. Whole-grain cereals. Flatbread. Melba toast. Pretzels. Breadsticks. Rusks. Low-fat snacks. Low-fat crackers, including oyster, saltine, matzo, graham, animal, and rye. Rice and pasta, including brown rice and pastas that are made with whole wheat. Vegetables All vegetables. Fruits All fruits, but limit coconut. Meats and Other Protein Sources Lean, well-trimmed beef, veal, pork, and lamb. Chicken and [place] without skin. All fish and shellfish. Wild duck, rabbit, pheasant, and venison. Egg whites or low-cholesterol egg substitutes. Dried beans, peas, lentils, and tofu. Seeds and most nuts. Dairy Low-fat or nonfat cheeses, including ricotta, string, and mozzarella. Skim or 1% milk that is liquid, powdered, or evaporated. Buttermilk that is made with low-fat milk. Nonfat or low-fat yogurt. Beverages Mineral water. Diet carbonated beverages. Sweets and Desserts Sherbets and fruit ices. Honey, jam, marmalade, jelly, and syrups. Meringues and gelatins. Pure sugar candy, such as hard candy, jelly beans,  gumdrops, mints, marshmallows, and small amounts of dark chocolate. [employer] Eat all sweets and desserts in moderation. Fats and Oils Nonhydrogenated (trans-free) margarines. Vegetable oils, including soybean, sesame, sunflower, olive, peanut, safflower, corn, canola, and cottonseed. Salad dressings or mayonnaise made with a vegetable oil. Limit added fats and oils that you use for cooking, baking, salads, and as spreads. Other Cocoa powder. Coffee and tea. All seasonings and condiments. The items listed above may not be a complete list of recommended foods or beverages. Contact your dietitian for more options. What foods are not recommended? Grains Breads that are made with saturated or trans fats, oils, or whole milk. Croissants. Butter rolls. Cheese breads. Sweet rolls. Donuts. Buttered popcorn. Chow mein noodles. High-fat crackers, such as cheese or butter crackers. Meats and Other Protein Sources Fatty meats, such as hotdogs, short ribs, sausage, spareribs, bacon, rib eye roast or steak, and mutton. High-fat deli meats, such as salami and bologna. Caviar. Domestic duck and goose. Organ meats, such as kidney, liver, sweetbreads, and heart. Dairy Cream, sour cream, cream cheese, and creamed cottage cheese. Whole-milk cheeses, including blue (bleu), Monte[place] Jack, [place], [address], American, [place], Swiss, cheddar, [place], and [place]. Whole or 2% milk that is liquid, evaporated, or condensed. Whole buttermilk. Cream sauce or high-fat cheese sauce. Yogurt that is made from whole milk. Beverages Regular sodas and juice drinks with added sugar. Sweets and Desserts Frosting. Pudding. Cookies. Cakes other than angel food cake. Candy that has milk chocolate or white chocolate, hydrogenated fat, butter, coconut, or unknown ingredients. Buttered syrups. Full-fat ice cream or ice cream drinks. Fats and Oils Gravy that has suet, meat fat, or shortening. Cocoa butter, hydrogenated oils, palm oil,  coconut oil, palm kernel oil. These can often be found in baked products, candy, fried foods, nondairy creamers, and whipped toppings. Solid fats and shortenings, including bacon fat, salt pork, lard, and butter. Nondairy cream substitutes, such as coffee creamers and sour cream substitutes. Salad dressings that are made of unknown oils, cheese, or sour cream. The items listed above may not be a complete list of foods and beverages to avoid. Contact your dietitian for more information. This information is not intended to replace advice given to you by your health care provider. Make sure you discuss any questions you have with your health care provider. Document Released: 05/11/2012 Document Revised: 04/17/2016 Document Reviewed: 05/04/2014 Elsevier Interactive Patient Education  [employer].

## 2017-12-08 LAB — BASIC METABOLIC PANEL
Anion gap: 7 (ref 5–15)
BUN: 8 mg/dL (ref 6–20)
CHLORIDE: 107 mmol/L (ref 101–111)
CO2: 27 mmol/L (ref 22–32)
CREATININE: 1.43 mg/dL — AB (ref 0.44–1.00)
Calcium: 8.1 mg/dL — ABNORMAL LOW (ref 8.9–10.3)
GFR calc non Af Amer: 38 mL/min — ABNORMAL LOW (ref >60)
GFR, EST AFRICAN AMERICAN: 44 mL/min — AB (ref >60)
Glucose, Bld: 82 mg/dL (ref 65–99)
Potassium: 4.3 mmol/L (ref 3.5–5.1)
Sodium: 141 mmol/L (ref 135–145)

## 2017-12-08 LAB — PROTIME-INR
INR: 1.93
Prothrombin Time: 21.9 seconds — ABNORMAL HIGH (ref 11.4–15.2)

## 2017-12-08 LAB — MAGNESIUM: Magnesium: 1.8 mg/dL (ref 1.7–2.4)

## 2017-12-08 LAB — HEPARIN LEVEL (UNFRACTIONATED): Heparin Unfractionated: 0.3 IU/mL (ref 0.30–0.70)

## 2017-12-08 LAB — CBC
HEMATOCRIT: 25 % — AB (ref 36.0–46.0)
HEMOGLOBIN: 7.7 g/dL — AB (ref 12.0–15.0)
MCH: 30.3 pg (ref 26.0–34.0)
MCHC: 30.8 g/dL (ref 30.0–36.0)
MCV: 98.4 fL (ref 78.0–100.0)
PLATELETS: 164 10*3/uL (ref 150–400)
RBC: 2.54 MIL/uL — AB (ref 3.87–5.11)
RDW: 15.5 % (ref 11.5–15.5)
WBC: 7.8 10*3/uL (ref 4.0–10.5)

## 2017-12-08 MED ORDER — WARFARIN SODIUM 5 MG PO TABS: 5.0000 mg | ORAL_TABLET | Freq: Once | ORAL | 0 refills | Status: DC

## 2017-12-08 MED ORDER — WARFARIN SODIUM 2 MG PO TABS: 3.0000 mg | ORAL_TABLET | Freq: Once | ORAL | Status: DC

## 2017-12-08 MED ORDER — SPIRONOLACTONE 25 MG PO TABS: 25.0000 mg | ORAL_TABLET | Freq: Every day | ORAL | 0 refills | Status: DC

## 2017-12-08 MED ORDER — STUDY - GALACTIC STUDY - PLACEBO / OMECAMTIV MECARBIL (PI-MCLEAN)
1.0000 | Freq: Two times a day (BID) | Status: DC
  Administered 2017-12-08: 1 via ORAL
  Filled 2017-12-08 (×2): qty 1

## 2017-12-08 NOTE — Progress Notes (Signed)
Discharge order received.  IV and telemetry removed.  Discharge instructions and medications reviewed with patient.  Demonstrated how to use foley securement device and had patient teach back.  Catheter after care instructions given to and reviewed with patient.  Patient to have [facility] RN for extra teaching.

## 2017-12-08 NOTE — Progress Notes (Addendum)
ANTICOAGULATION CONSULT NOTE - Follow Up Consult  Pharmacy Consult for Heparin and Coumadin Indication: atrial fibrillation  Allergies  Allergen Reactions  . Ace Inhibitors Palpitations    Patient Measurements: Height: 5\' 5"  (165.1 cm) Weight: 236 lb 11.2 oz (107.4 kg) IBW/kg (Calculated) : 57 Heparin Dosing Weight: 81 kg  Vital Signs: Temp: 98.8 F (37.1 C) (01/15 0500) Temp Source: Oral (01/15 0500) BP: 120/72 (01/15 0500) Pulse Rate: 60 (01/15 0500)  Labs: Recent Labs    12/06/17 0810 12/06/17 0952 12/07/17 0727 12/07/17 1825 12/08/17 0424  HGB 8.9* 8.5* 8.3*  --  7.7*  HCT 29.1* 27.8* 27.1*  --  25.0*  PLT 161 146* 158  --  164  LABPROT 16.5*  --  18.7*  --  21.9*  INR 1.35  --  1.58  --  1.93  HEPARINUNFRC 0.49  --  0.26* 0.30 0.30  CREATININE 1.34* 1.38* 1.28*  --  1.43*    Estimated Creatinine Clearance: 48.4 mL/min (A) (by C-G formula based on SCr of 1.43 mg/dL (H)).  Assessment:  67 y/o female with h/o afib and stroke on warfarin PTA admitted 11/30/2017 with obstructive uropathy, hematuria, AKI and N/V. Also had BRBPR s/p colonoscopy on 1/11 and presumed self-limited diverticular bleed. Admit INR >10 and was reversed with vitamin K 5 mg PO on 1/7 and 5 mg IV on 1/8. Pharmacy consulted to resume warfarin and bridge with IV heparin on 1/11.  Heparin level is therapeutic at 0.3 this evening, on 1400 units/hr. No issues with infusion per nursing. INR increased from 1.58 to 1.93 today, has received 4 doses of warfarin 5 mg.  Hgb down from 8.3 to 7.7, platelets is stable. No signs/symptoms of bleeding. Notes indicate plan to bridge until INR>1.8- will defer to team on duration of heparin infusion.  Goal of Therapy:  INR 2-3 Heparin level 0.3-0.5 units/ml due to recent bleeding Monitor platelets by anticoagulation protocol: Yes   Plan:  Increase heparin to 1450 units / hr Warfarin 3 mg x1 tonight Monitor daily HL, CBC, and s/sx of bleeding  Thank you Doylene Canard, PharmD Clinical Pharmacist  Pager: [phone] Clinical Phone for 12/08/2017 until 3:30pm: [phone] If after 3:30pm, please call main pharmacy at [phone] 12/08/2017,7:37 AM   ADDENDUM Heparin infusion has been discontinued due to INR of 1.9 today. Discharge summary entered into chart. Would recommend continuing home regimen at discharge and follow-up with INR clinic this week.  Doylene Canard, PharmD Clinical Pharmacist  Pager: [phone] Phone: [phone]

## 2017-12-08 NOTE — Discharge Summary (Signed)
Physician Discharge Summary  Patricia Wagner UXN:[id number] DOB: 1951/06/11 DOA: 11/30/2017  PCP: Biagio Borg, MD  Admit date: 11/30/2017 Discharge date: 12/08/2017  Time spent: 25 minutes  Recommendations for Outpatient Follow-up:  1. Needs INR check in 4-7 days-d/c dose Coumadin 5 mg 2. Get Bmet in 7 days 3. Needs follow up Urology as OP 2 weeks-given name of practice and people to follow with 4. No changes to home [place] study med 5. Evalaute Adrenal mass as OP  Discharge Diagnoses:  Principal Problem:   Acute renal failure superimposed on chronic kidney disease (HCC) Active Problems:   Essential hypertension   Raynaud's syndrome   Diverticulosis of colon with hemorrhage   CAD in native artery, s/p cardiac cath with non obstructive CAD   CHF (congestive heart failure) ([place])   Cerebrovascular accident (CVA) due to embolism of left cerebellar artery (HCC)   Atrial fibrillation (HCC)   Subtherapeutic international normalized ratio (INR)   Diarrhea   Discharge Condition: improved  Diet recommendation: heart healthy  Filed Weights   12/06/17 0542 12/07/17 0520 12/08/17 0500  Weight: 106.3 kg (234 lb 4.8 oz) 106.9 kg (235 lb 9.6 oz) 107.4 kg (236 lb 11.2 oz)    History of present illness:  67 year old female with PMH of persistent A. fib on Coumadin, status post multiple failed DCCV's, chronic systolic CHF/NICM, CAD, HTN, HLD, CVA, stage III chronic kidney disease, COPD, suspected sleep apnea, presented withover a week of worseninginability to urinate. Shereporteddribbling only, with the inability tofully empty her bladder, associated w/suprapubic pain. In the ED a foley returned over 2 liters ofurine.[facility]creatinine[facility]. Nephrology consulted, AKI improved and they signed off.    Hospital Course:  Acute renal failure on CKD Stage 3 -Renal ultrasound: Negative hydronephrosis - Nephrology was consulted and felt that this was  due to obstructive/retention pathology - Foley catheter was placed for urinary retention. She was hydrated with IV fluids. - Creatinine has returned to baseline. Acute kidney injury resolved.  Obstructive uropathy/Urine Retention -Unclear etiology -CT abdomen pelvis nondiagnostic see results below -Patient failed voiding trial. After removal of Foley began to have retention overnight which required in and out cath multiple times. - Flomax 0.4 MG daily was started. - Dr. Manfred Arch, [facility] on call 1/14 recommended discharging patient (not much to offer from Urology standpoint while inpatient) with Foley catheter and outpatient follow-up with them in 2 weeks.  LEFT Adrenal mass -Incidental finding on CT scan see results below. Counseled patient that she will need to follow-up with her PCP. PCP would need to possibly have endocrinologist evaluate as outpatient.  Chronic Atrial fib on Coumadin - Multiple failed DCCV - Per EMR scheduled for another ablation with Dr. Curt Bears EP this month. As per cardiology, she absolutely cannot tolerate A. fib with recurrent cardiogenic shock when A. fib recurs. - Set up for A fib ablation 12/11/2017.However  pre-operative cardiac CT has been canceled so not clear that she is actually going to undergo the ablation,  - Continue Amiodarone 200 mg daily and Coreg 3.125 mg BID. Resume Ranexa. -bridged with heparin until INR at least greater than 1.8.  Was 1.93 on d/c  Chronic systolic CHF/Nonischemic Cardiomyopathy - Clinically appears compensated. --900 ML since admission. - Continue Aldactone. - Continue to monitor strict intake and output and daily weights.  - Now that acute kidney injury has resolved, hypotension resolved, resume BiDil, Lasix and consider resuming Entresto discharge (refer to [name], Cardiology note 1/7)  Acute blood loss anemia complicating anemia of chronic disease. - Recent baseline hemoglobin probably in the mid 8 g  per DL range. - Hemoglobin dropped to a low of 7.4 on 1/9. - Status post 2 unit PRBC transfusion. - Hemoglobin stable in the 8 g range. Follow CBCs periodically.  Essential HTN - Controlled.  Lower GI bleed/Diverticulosis - BRBPR -1/7 fecal occult positive (INR supratherapeutic at the time >10) - [facility] was consulted. Patient underwent colonoscopy on 1/11 which showed diverticulosis in the entire examined colon and GI suspected that she had presumed self-limited diverticular bleed. GI cleared to resume Coumadin. Repeat colonoscopy in 5-10 years for surveillance. Follow-up with GI as needed. - Rectal bleeding resolved.   Coumadin-induced coagulopathy - INR was >10 on admission. This was reversed with vitamin K.  - Patient is on IV heparin bridge and Coumadin anticoagulation. Given prior history of stroke, continue heparin bridge until INR >1.8 and then can follow-up outpatient.   HLD  - Continue statins.   Hypokalemia - Replaced.   Hypomagnesemia - Magnesium 1.4 on 1/14. Replace and follow. was 1.8 on d/c     Procedures/Significant Events:   1/9 CT abdomen and pelvis WO contrast:No urolithiasis. Foley catheter in place with bladder wall thickening. Prominence of both renal collecting systems without frank hydronephrosis. No evidence of obstructive uropathy or bladder outlet obstruction or obstructive uropathy. -Colonic diverticulosis without diverticulitis. -Bilateral renal cysts. - Incidental findings of left adrenal adenoma right adrenal adenoma 1/11 S/P colonoscopy:The examined portion of the ileum was normal. - Diverticulosis in the entire examined colon. Presumed self-limited diverticular bleed    Discharge Exam: Vitals:   12/08/17 0500 12/08/17 0855  BP: 120/72 116/72  Pulse: 60 64  Resp: 18   Temp: 98.8 F (37.1 C)   SpO2: 93%     General: awake alert in NAD Cardiovascular:  s1 s2 Irreg Respiratory: clear without added sound JVD neg at 30  deg abd soft nt nd no rebound Trace LE edema  Discharge Instructions   Discharge Instructions    Diet - low sodium heart healthy   Complete by:  As directed    Discharge instructions   Complete by:  As directed    Take 5 mg coumadin until you are seen in Coumadin clinic in 4-5 days--Ge tan INR level to determine your new dose of Coumadin then we have prescribed Flomax which is a new medicine for your bladder-continue lasix and Aldactone as per the doses given to you I have given you the name of the Urologist to follow with as an OP-please see them in 2 weeks for further instructions   Increase activity slowly   Complete by:  As directed      Allergies as of 12/08/2017      Reactions   Ace Inhibitors Palpitations    Med Rec must be completed prior to using this [place]    Allergies  Allergen Reactions  . Ace Inhibitors Palpitations      The results of significant diagnostics from this hospitalization (including imaging, microbiology, ancillary and laboratory) are listed below for reference.    Significant Diagnostic Studies: Ct Abdomen Pelvis Wo Contrast  Result Date: 12/02/2017 CLINICAL DATA:  Obstructive uropathy at level of bladder. Lower abdominal pain and hematuria. EXAM: CT ABDOMEN AND PELVIS WITHOUT CONTRAST TECHNIQUE: Multidetector CT imaging of the abdomen and pelvis was performed following the standard protocol without IV contrast. COMPARISON:  Renal ultrasound yesterday. FINDINGS: Lower chest: The lung bases are clear. Mild cardiomegaly.  There are coronary artery calcifications. Hepatobiliary: No focal hepatic lesion allowing for lack contrast. Clips in the gallbladder fossa postcholecystectomy. No biliary dilatation. Pancreas: No ductal dilatation or inflammation. Spleen: Normal in size without focal abnormality. Adrenals/Urinary Tract: Low-density left adrenal nodule, partially obscured by motion, however consistent with adenoma. Small low-density right adrenal  nodule measuring 14 mm may be an adenoma or myelolipoma. Mild prominence of bilateral renal collecting systems without frank hydronephrosis, right greater than left. Mild symmetric perinephric edema. Cyst in the interpolar left kidney measures approximately 3.5 cm. Cyst in the right kidney measures 17 mm. No urolithiasis. Foley catheter decompressing the urinary bladder. There is diffuse bladder wall thickening. Intravesicular air likely related to Foley catheter. No external vesicular mass effect. Stomach/Bowel: Colonic diverticulosis throughout the colon. No diverticulitis. Normal appendix. No bowel inflammation or obstruction. The stomach is nondistended. Lack of enteric contrast limits detailed bowel assessment. Vascular/Lymphatic: Aortic and branch atherosclerosis. No aneurysm. No bulky adenopathy, lack of contrast limits assessment. Reproductive: Post hysterectomy with prominence of the vaginal cuff. No adnexal mass. Possible right ovary in situ, quiescent. Other: No ascites or free air. No intra-abdominal abscess. Small fat containing umbilical hernia. Musculoskeletal: Multilevel degenerative change throughout the lumbar spine. There are no acute or suspicious osseous abnormalities. Intramuscular lipoma within right obturator muscle, incidental. IMPRESSION: 1. No urolithiasis. Foley catheter in place with bladder wall thickening. Prominence of both renal collecting systems without frank hydronephrosis. No evidence of obstructive uropathy or bladder outlet obstruction or obstructive uropathy. 2. Colonic diverticulosis without diverticulitis. 3. Bilateral renal cysts. 4. Incidental findings of left adrenal adenoma right adrenal adenoma versus myelolipoma, and Aortic Atherosclerosis (ICD10-I70.0). Electronically Signed   By: Jeb Levering M.D.   On: 12/02/2017 02:34   US Renal  Result Date: 12/01/2017 CLINICAL DATA:  Acute renal injury EXAM: RENAL / URINARY TRACT ULTRASOUND COMPLETE COMPARISON:  04/29/2017  FINDINGS: Limited by patient body habitus and overlying bowel. Right Kidney: Length: 9.9 cm. Echogenicity within normal limits. No mass or hydronephrosis visualized. Left Kidney: Length: 10.4 cm. Echogenicity within normal limits. No nephrolithiasis nor hydronephrosis. Slight exophytic anechoic interpolar renal cyst measuring 2.6 x 2.6 x 2.7 cm. Bladder: Decompressed by Foley catheter. IMPRESSION: Interpolar left renal cyst measuring 2.6 x 2.6 x 2.7 cm. Through transmission is noted. No suspicious septations or nodular foci noted within. Maintained cortical-medullary distinction of the kidneys. Electronically Signed   By: Ashley Royalty M.D.   On: 12/01/2017 03:22   Dg Chest Port 1 View  Result Date: 11/30/2017 CLINICAL DATA:  Sepsis and abdominal pain EXAM: PORTABLE CHEST 1 VIEW COMPARISON:  Chest radiograph 10/22/2017 FINDINGS: Mild cardiomegaly, unchanged. No focal airspace consolidation. No pneumothorax or sizable pleural effusion. No pulmonary edema. Mild aortic arch calcific atherosclerosis. IMPRESSION: Unchanged cardiomegaly and aortic atherosclerosis (ICD10-I70.0) without focal airspace disease. Electronically Signed   By: Ulyses Jarred M.D.   On: 11/30/2017 16:44    Microbiology: Recent Results (from the past 240 hour(s))  Urine culture     Status: None   Collection Time: 11/30/17  4:17 PM  Result Value Ref Range Status   Specimen Description URINE, RANDOM  Final   Special Requests NONE  Final   Culture NO GROWTH  Final   Report Status 12/01/2017 FINAL  Final  Blood Culture (routine x 2)     Status: None   Collection Time: 11/30/17  4:35 PM  Result Value Ref Range Status   Specimen Description BLOOD LEFT ANTECUBITAL  Final   Special Requests   Final  BOTTLES DRAWN AEROBIC AND ANAEROBIC Blood Culture results may not be optimal due to an excessive volume of blood received in culture bottles   Culture NO GROWTH 5 DAYS  Final   Report Status 12/05/2017 FINAL  Final  Blood Culture (routine  x 2)     Status: None   Collection Time: 11/30/17  5:22 PM  Result Value Ref Range Status   Specimen Description BLOOD BLOOD LEFT WRIST  Final   Special Requests   Final    Blood Culture adequate volume BOTTLES DRAWN AEROBIC AND ANAEROBIC   Culture NO GROWTH 5 DAYS  Final   Report Status 12/05/2017 FINAL  Final  C difficile quick scan w PCR reflex     Status: None   Collection Time: 11/30/17  8:50 PM  Result Value Ref Range Status   C Diff antigen NEGATIVE NEGATIVE Final   C Diff toxin NEGATIVE NEGATIVE Final   C Diff interpretation No C. difficile detected.  Final  MRSA PCR Screening     Status: None   Collection Time: 12/01/17  3:19 PM  Result Value Ref Range Status   MRSA by PCR NEGATIVE NEGATIVE Final    Comment:        The GeneXpert MRSA Assay (FDA approved for NASAL specimens only), is one component of a comprehensive MRSA colonization surveillance program. It is not intended to diagnose MRSA infection nor to guide or monitor treatment for MRSA infections.      Labs: Basic Metabolic Panel: Recent Labs  Lab 12/02/17 0356  12/05/17 0243 12/05/17 1203 12/06/17 0810 12/06/17 0952 12/07/17 0727 12/08/17 0424  NA 144   < > 144  --  142 142 144 141  K 3.3*   < > 3.9 4.0 4.1 4.1 4.3 4.3  CL 111   < > 111  --  109 109 111 107  CO2 22   < > 28  --  26 25 28 27   GLUCOSE 101*   < > 101*  --  90 113* 86 82  BUN 109*   < > 14  --  8 8 7 8   CREATININE 4.72*   < > 1.44*  --  1.34* 1.38* 1.28* 1.43*  CALCIUM 7.6*   < > 7.8*  --  8.4* 8.3* 8.2* 8.1*  MG  --    < > 1.7 1.7 1.7 1.7 1.4* 1.8  PHOS 3.8  --   --   --   --   --   --   --    < > = values in this interval not displayed.   Liver Function Tests: Recent Labs  Lab 12/02/17 0356  ALBUMIN 2.6*   No results for input(s): LIPASE, AMYLASE in the last 168 hours. No results for input(s): AMMONIA in the last 168 hours. CBC: Recent Labs  Lab 12/05/17 0243 12/06/17 0810 12/06/17 0952 12/07/17 0727 12/08/17 0424   WBC 7.3 8.1 8.2 8.2 7.8  HGB 8.0* 8.9* 8.5* 8.3* 7.7*  HCT 25.3* 29.1* 27.8* 27.1* 25.0*  MCV 95.5 98.0 97.5 98.9 98.4  PLT 112* 161 146* 158 164   Cardiac Enzymes: No results for input(s): CKTOTAL, CKMB, CKMBINDEX, TROPONINI in the last 168 hours. BNP: BNP (last 3 results) Recent Labs    04/29/17 0935  BNP 2,712.1*    ProBNP (last 3 results) No results for input(s): PROBNP in the last 8760 hours.  CBG: No results for input(s): GLUCAP in the last 168 hours.     Signed:  Darylene Price  Cy Bresee MD   [facility] 12/08/2017, 9:23 AM

## 2017-12-08 NOTE — Research (Signed)
RESEARCH ENCOUNTER  Patient ID: Patricia Wagner  DOB: Aug 05, 1951  [name] currently hospitalized for acute renal failure.  The week 12 visit of the [facility] completed during hospitalization.  No signs/symptoms of ACS since the last visit.   Subject states she is compliant with IP.  IP stopped on admission and resumed today, Jan 15.  Subject unable to return IP at this time and she states that a friend/family member will not be able to bring the IP boxes while she is in the hospital.  Subject will have close follow-up after discharge and states that she will return the boxes once discharged home.  Subject instructed to start taking pills from new boxes only and put old boxes to the side until she is able to return them.  Subject verbalized understanding.    Patient will follow up with the [facility] in 12 weeks.

## 2017-12-08 NOTE — Care Management Note (Addendum)
Case Management Note  Patient Details  Name: Patricia Wagner MRN: [id number] Date of Birth: 24-Dec-1950  Subjective/Objective: Pt presented for Acute Renal Failure- plan for home with foley catheter. Pt to have Urology appointment within 2 weeks of d/c. CM did speak with pt in regards to [facility] RN. Per pt she is independent and will not need any assistance. Pt states she has a daughter that works during the day.                   Action/Plan: CM did ask staff RN to teach the patient about foley care. No further needs from CM at this time.   Expected Discharge Date:  12/08/17               Expected Discharge Plan:  [place]  In-House Referral:  NA  Discharge planning Services  CM Consult  Post Acute Care Choice:  Home Health Choice offered to:  Patient  DME Arranged:  N/A DME Agency:  NA  HH Arranged:  RN [place] Agency:   [facility]  Status of Service:  Completed, signed off  If discussed at [place] of Stay Meetings, dates discussed:    Additional Comments: 1514 12-08-17 Jacqlyn Krauss, RN BSN [phone] Staff RN alerted CM that pt now wants [facility] for foley catheter care at home. Pt chose [facility] for Services- SOC to begin within 24-48 hours post d/c. No further needs from CM at this time.  Bethena Roys, RN 12/08/2017, 1:01 PM

## 2017-12-10 ENCOUNTER — Telehealth: Payer: Self-pay | Admitting: Internal Medicine

## 2017-12-10 NOTE — Telephone Encounter (Signed)
Copied from [place]. Topic: General - Other >> Dec 10, 2017  9:28 AM Lolita Rieger, RMA wrote: Reason for CRM: Edwena Blow from [facility] stated that pt is refusing home health service because she stated that she can take care of herself and due to a copay for service  Please contact Shana [phone] 

## 2017-12-11 ENCOUNTER — Encounter (HOSPITAL_COMMUNITY): Payer: Self-pay

## 2017-12-11 ENCOUNTER — Ambulatory Visit (HOSPITAL_COMMUNITY): Admit: 2017-12-11 | Payer: BLUE CROSS/BLUE SHIELD | Admitting: Cardiology

## 2017-12-11 ENCOUNTER — Ambulatory Visit (INDEPENDENT_AMBULATORY_CARE_PROVIDER_SITE_OTHER): Payer: BLUE CROSS/BLUE SHIELD | Admitting: Pharmacist Clinician (PhC)/ Clinical Pharmacy Specialist

## 2017-12-11 DIAGNOSIS — I4819 Other persistent atrial fibrillation: Secondary | ICD-10-CM

## 2017-12-11 DIAGNOSIS — Z7901 Long term (current) use of anticoagulants: Secondary | ICD-10-CM | POA: Diagnosis not present

## 2017-12-11 DIAGNOSIS — I481 Persistent atrial fibrillation: Secondary | ICD-10-CM | POA: Diagnosis not present

## 2017-12-11 LAB — POCT INR: INR: 4

## 2017-12-11 SURGERY — ATRIAL FIBRILLATION ABLATION
Anesthesia: General

## 2017-12-11 NOTE — Patient Instructions (Signed)
Description   Continue on just 3 mg tablets - no more 5 mg tablets.  No warfarin today Friday Jan 18, then continue with 1 tablet daily except for 1.5 tablet every Wednesday. Repeat INR in 1 weeks.

## 2017-12-17 ENCOUNTER — Encounter: Payer: Self-pay | Admitting: Internal Medicine

## 2017-12-17 ENCOUNTER — Other Ambulatory Visit (INDEPENDENT_AMBULATORY_CARE_PROVIDER_SITE_OTHER): Payer: BLUE CROSS/BLUE SHIELD

## 2017-12-17 ENCOUNTER — Ambulatory Visit: Payer: BLUE CROSS/BLUE SHIELD | Admitting: Internal Medicine

## 2017-12-17 VITALS — BP 102/64 | HR 88 | Temp 98.3°F | Ht 65.0 in | Wt 223.0 lb

## 2017-12-17 DIAGNOSIS — N179 Acute kidney failure, unspecified: Secondary | ICD-10-CM

## 2017-12-17 DIAGNOSIS — R339 Retention of urine, unspecified: Secondary | ICD-10-CM | POA: Diagnosis not present

## 2017-12-17 DIAGNOSIS — D649 Anemia, unspecified: Secondary | ICD-10-CM

## 2017-12-17 NOTE — Patient Instructions (Addendum)
Please continue all other medications as before, and refills have been done if requested.  Please have the pharmacy call with any other refills you may need.  Please continue your efforts at being more active, low cholesterol diet, and weight control..  Please keep your appointments with your specialists as you may have planned  Please go to the LAB in the Basement (turn left off the elevator) for the tests to be done today  You will be contacted by phone if any changes need to be made immediately.  Otherwise, you will receive a letter about your results with an explanation, but please check with MyChart first.  Please remember to sign up for MyChart if you have not done so, as this will be important to you in the future with finding out test results, communicating by private email, and scheduling acute appointments online when needed.   

## 2017-12-17 NOTE — Progress Notes (Signed)
Subjective:    Patient ID: Patricia Wagner, female    DOB: 1950-12-19, 67 y.o.   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   Pt was hospd recently 1/7-1/15 with AKI related to inability to urinate, resolved with foley.  Seen per renal as inpt, referred to urology at d/c.  Denies urinary symptoms such as dysuria, frequency, urgency, flank pain, hematuria or n/v, fever, chills or hesitancy.  No overt bleeding on coumadin and current issues.   Due for f/u INR Past Medical History:  Diagnosis Date  . Arthritis   . Atrial fibrillation ([place])    a. s/p multiple cardioversions; failed tikosyn/sotalol.  Marland Kitchen CAD in native artery, s/p cardiac cath with non obstructive CAD 10/24/2016  . CHF (congestive heart failure) ([place])   . DIVERTICULITIS, HX OF 07/25/2007  . DIVERTICULOSIS, COLON 07/22/2007  . Dyspnea   . Edema, peripheral    a. chronic BLE edema, R>L. Prior trauma from dog attack and accident.  Marland Kitchen HYPERLIPIDEMIA 02/03/2008  . Hypersomnia    declines w/u  . Hypertension   . MENOPAUSAL DISORDER 01/09/2011  . Morbid obesity ([place]) 07/22/2007  . NICM (nonischemic cardiomyopathy) ([place]) 10/24/2016  . Raynaud's syndrome 07/22/2007  . Stroke ([place]) 2017  . THYROID NODULE, RIGHT 01/04/2010  . VITAMIN D DEFICIENCY 01/09/2011   Qualifier: Diagnosis of  By: [name], [name]    Past Surgical History:  Procedure Laterality Date  . CARDIAC CATHETERIZATION N/A 10/23/2016   Procedure: Left Heart Cath and Coronary Angiography;  Surgeon: [name], MD;  Location: [facility];  Service: Cardiovascular;  Laterality: N/A;  . CARDIOVERSION N/A 10/31/2016   Procedure:  CARDIOVERSION;  Surgeon: [name], MD;  Location: [place];  Service: Cardiovascular;  Laterality: N/A;  . CARDIOVERSION N/A 11/03/2016   Procedure: CARDIOVERSION;  Surgeon: [name], MD;  Location: [place];  Service: Cardiovascular;  Laterality: N/A;  . CARDIOVERSION N/A 11/18/2016   Procedure: CARDIOVERSION;  Surgeon: [name], MD;  Location: [place];  Service: Cardiovascular;  Laterality: N/A;  . CARDIOVERSION N/A 03/25/2017   Procedure: CARDIOVERSION;  Surgeon: [name], MD;  Location: [facility] ENDOSCOPY;  Service: Cardiovascular;  Laterality: N/A;  . CARDIOVERSION N/A 05/06/2017   Procedure: CARDIOVERSION;  Surgeon: [name], MD;  Location: [facility] ENDOSCOPY;  Service: Cardiovascular;  Laterality: N/A;  . CARDIOVERSION N/A 05/12/2017   Procedure: CARDIOVERSION;  Surgeon: [name], MD;  Location: [facility] ENDOSCOPY;  Service: Cardiovascular;  Laterality: N/A;  . COLONOSCOPY N/A 12/04/2017   Procedure: COLONOSCOPY;  Surgeon: [name], MD;  Location: [place];  Service: Endoscopy;  Laterality: N/A;  . COLONOSCOPY W/ POLYPECTOMY  02/2011   pan diverticulosis.  tubular adenoma without dysplasia on 5 mm sigmoid polyp.  Dr Fuller Plan.    Marland Kitchen PARTIAL HYSTERECTOMY     1 OVARY LEFT  . TEE WITHOUT CARDIOVERSION N/A 10/31/2016   Procedure: TRANSESOPHAGEAL ECHOCARDIOGRAM (TEE);  Surgeon: [name], MD;  Location: [facility] ENDOSCOPY;  Service: Cardiovascular;  Laterality: N/A;  . TEE WITHOUT CARDIOVERSION N/A 03/25/2017   Procedure: TRANSESOPHAGEAL ECHOCARDIOGRAM (TEE);  Surgeon: [name],  MD;  Location: [place];  Service: Cardiovascular;  Laterality: N/A;    reports that she has been smoking cigarettes.  She has been smoking about 0.50 packs per day. she has never used smokeless tobacco. She reports that she does not drink alcohol or use drugs. family history includes Asthma in her mother; Diabetes in her father; Heart disease in her father and  sister; Lung disease in her sister. Allergies  Allergen Reactions  . Ace Inhibitors Palpitations   Current Outpatient Medications on File Prior to Visit  Medication Sig Dispense Refill  . acetaminophen (TYLENOL) 325 MG tablet Take 650 mg by mouth every 6 (six) hours as needed (pain).     Marland Kitchen amiodarone (PACERONE) 200 MG tablet TAKE 1 TABLET BY MOUTH EVERY DAY 30 tablet 6  . diclofenac sodium (VOLTAREN) 1 % GEL Apply 4 g topically 4 (four) times daily as needed (As directed for skin). (Patient taking differently: Apply 4 g topically 4 (four) times daily as needed (for pain). ) 100 g 0  . furosemide (LASIX) 20 MG tablet Take 1 tablet (20 mg total) by mouth daily.    . Investigational - Study Medication Take 1 tablet by mouth 2 (two) times daily. Study name: Galactic HF Study  Additional study details: Omecamtiv Mecarbil or Placebo 1 each PRN  . isosorbide-hydrALAZINE (BIDIL) 20-37.5 MG tablet Take 1 tablet by mouth 3 (three) times daily. (Patient taking differently: Take 1 tablet by mouth 2 (two) times daily. ) 180 tablet 11  . ranolazine (RANEXA) 500 MG 12 hr tablet Take 1 tablet (500 mg total) by mouth 2 (two) times daily. 60 tablet 5  . rosuvastatin (CRESTOR) 10 MG tablet TAKE 1 TABLET (10 MG TOTAL) BY MOUTH DAILY. 90 tablet 2  . sacubitril-valsartan (ENTRESTO) 49-51 MG Take 1 tablet by mouth 2 (two) times daily. 60 tablet 3  . spironolactone (ALDACTONE) 25 MG tablet Take 1 tablet (25 mg total) by mouth daily. 30 tablet 0  . tamsulosin (FLOMAX) 0.4 MG CAPS capsule Take 1 capsule (0.4 mg total) by mouth daily. 30 capsule 1  . carvedilol (COREG) 3.125 MG tablet Take 1 tablet (3.125 mg total) by mouth 2 (two) times daily. 180 tablet 3   No current facility-administered medications on file prior to visit.    Review of Systems  Constitutional: Negative for other unusual diaphoresis or sweats HENT: Negative for ear discharge or swelling Eyes: Negative for other worsening visual  disturbances Respiratory: Negative for stridor or other swelling  Gastrointestinal: Negative for worsening distension or other blood Genitourinary: Negative for retention or other urinary change Musculoskeletal: Negative for other MSK pain or swelling Skin: Negative for color change or other new lesions Neurological: Negative for worsening tremors and other numbness  Psychiatric/Behavioral: Negative for worsening agitation or other fatigue All other system neg per pt    Objective:   Physical Exam BP 102/64   Pulse 88   Temp 98.3 F (36.8 C) (Oral)   Ht 5\' 5"  (1.651 m)   Wt 223 lb (101.2 kg)   SpO2 98%   BMI 37.11 kg/m  VS noted,  Constitutional: Pt appears in NAD HENT: Head: NCAT.  Right Ear: External ear normal.  Left Ear: External ear normal.  Eyes: . Pupils are equal, round, and reactive to light. Conjunctivae and EOM are normal Nose: without d/c or deformity Neck: Neck supple. Gross normal ROM Cardiovascular: Normal rate and regular rhythm.   Pulmonary/Chest: Effort normal and breath sounds without rales or wheezing.  Abd:  Soft, NT, ND, + BS, no organomegaly, no flank tender Neurological: Pt is alert. At baseline orientation, motor grossly intact Skin: Skin is warm. No rashes, other new lesions, no LE edema Psychiatric: Pt behavior is normal with irritable, frustrated, mild agitation noted No other exam findings     Assessment & Plan:

## 2017-12-18 ENCOUNTER — Other Ambulatory Visit: Payer: Self-pay | Admitting: Internal Medicine

## 2017-12-18 ENCOUNTER — Encounter: Payer: Self-pay | Admitting: Internal Medicine

## 2017-12-18 ENCOUNTER — Ambulatory Visit (INDEPENDENT_AMBULATORY_CARE_PROVIDER_SITE_OTHER): Payer: BLUE CROSS/BLUE SHIELD | Admitting: Pharmacist

## 2017-12-18 DIAGNOSIS — Z7901 Long term (current) use of anticoagulants: Secondary | ICD-10-CM | POA: Diagnosis not present

## 2017-12-18 DIAGNOSIS — N183 Chronic kidney disease, stage 3 unspecified: Secondary | ICD-10-CM

## 2017-12-18 DIAGNOSIS — N184 Chronic kidney disease, stage 4 (severe): Secondary | ICD-10-CM

## 2017-12-18 LAB — CBC WITH DIFFERENTIAL/PLATELET
BASOS PCT: 0.9 % (ref 0.0–3.0)
Basophils Absolute: 0 10*3/uL (ref 0.0–0.1)
EOS ABS: 0.1 10*3/uL (ref 0.0–0.7)
Eosinophils Relative: 2.5 % (ref 0.0–5.0)
HCT: 33.7 % — ABNORMAL LOW (ref 36.0–46.0)
HEMOGLOBIN: 10.7 g/dL — AB (ref 12.0–15.0)
LYMPHS ABS: 1.1 10*3/uL (ref 0.7–4.0)
Lymphocytes Relative: 22.8 % (ref 12.0–46.0)
MCHC: 31.9 g/dL (ref 30.0–36.0)
MCV: 95.8 fl (ref 78.0–100.0)
MONO ABS: 0.5 10*3/uL (ref 0.1–1.0)
Monocytes Relative: 11.1 % (ref 3.0–12.0)
NEUTROS ABS: 3 10*3/uL (ref 1.4–7.7)
NEUTROS PCT: 62.7 % (ref 43.0–77.0)
PLATELETS: 193 10*3/uL (ref 150.0–400.0)
RBC: 3.51 Mil/uL — ABNORMAL LOW (ref 3.87–5.11)
RDW: 17.4 % — AB (ref 11.5–15.5)
WBC: 4.9 10*3/uL (ref 4.0–10.5)

## 2017-12-18 LAB — BASIC METABOLIC PANEL
BUN: 23 mg/dL (ref 6–23)
CALCIUM: 9.1 mg/dL (ref 8.4–10.5)
CO2: 30 mEq/L (ref 19–32)
Chloride: 101 mEq/L (ref 96–112)
Creatinine, Ser: 2.42 mg/dL — ABNORMAL HIGH (ref 0.40–1.20)
GFR: 25.86 mL/min — AB (ref >60.00)
Glucose, Bld: 101 mg/dL — ABNORMAL HIGH (ref 70–99)
POTASSIUM: 4.8 meq/L (ref 3.5–5.1)
SODIUM: 139 meq/L (ref 135–145)

## 2017-12-18 LAB — POCT INR: INR: 5

## 2017-12-18 NOTE — Progress Notes (Signed)
HPI: Follow-up atrial fibrillation and CHF. Patient admitted with new-onset atrial fibrillation November 2017. Echo showed ejection fraction 20-25%, biatrial enlargement and mild mitral regurgitation. Cardiac catheterization November 2017 showed no obstructive coronary disease. Patient had difficulties with recurrent atrial fibrillation undergoing multiple cardioversion attempts. She had bradycardia on sotalol.TEE 5/18 showed severe LV dysfunction, severe RV dysfunction, mild to moderate mitral regurgitation and moderate tricuspid regurgitation.Patient was admitted June 2018 with congestive heart failure and hypotension. She was placed on amiodarone drip and also required dobutamine. She ultimately had successful cardioversion. Ranexa was added for additional antiarrhythmic benefit.Last echocardiogram August 2018 showed ejection fraction 30-35%,severe left ventricular hypertrophy, mild to moderate mitral regurgitation, biatrial enlargement and moderately reduced RV function. Fat pad bx negative. Nuclear study 10/18 felt equivocal but not favored for amyloid. Patient has been scheduled for atrial fibrillation ablation January 2019 as she has had cardiogenic shock with atrial fibrillation previously.   Recently admitted with acute renal failure felt to be secondary to urinary retention.  Improved with Foley catheter placement.  Since last seen, patient denies dyspnea, chest pain, palpitations, syncope or pedal edema.  Mild dizziness.  During evaluation patient is slurring her words.  In reviewing her chart her last creatinine had increased to 2.42 on January 24 and there has been no recheck since.  Current Outpatient Medications  Medication Sig Dispense Refill  . acetaminophen (TYLENOL) 325 MG tablet Take 650 mg by mouth every 6 (six) hours as needed (pain).     Marland Kitchen amiodarone (PACERONE) 200 MG tablet TAKE 1 TABLET BY MOUTH EVERY DAY 30 tablet 6  . carvedilol (COREG) 3.125 MG tablet Take 1 tablet  (3.125 mg total) by mouth 2 (two) times daily. 180 tablet 3  . diclofenac sodium (VOLTAREN) 1 % GEL Apply 4 g topically 4 (four) times daily as needed (As directed for skin). (Patient taking differently: Apply 4 g topically 4 (four) times daily as needed (for pain). ) 100 g 0  . furosemide (LASIX) 20 MG tablet Take 1 tablet (20 mg total) by mouth daily.    . Investigational - Study Medication Take 1 tablet by mouth 2 (two) times daily. Study name: Galactic HF Study  Additional study details: Omecamtiv Mecarbil or Placebo 1 each PRN  . isosorbide-hydrALAZINE (BIDIL) 20-37.5 MG tablet Take 1 tablet by mouth 3 (three) times daily. (Patient taking differently: Take 1 tablet by mouth 2 (two) times daily. ) 180 tablet 11  . ranolazine (RANEXA) 500 MG 12 hr tablet Take 1 tablet (500 mg total) by mouth 2 (two) times daily. 60 tablet 5  . rosuvastatin (CRESTOR) 10 MG tablet TAKE 1 TABLET (10 MG TOTAL) BY MOUTH DAILY. 90 tablet 2  . sacubitril-valsartan (ENTRESTO) 49-51 MG Take 1 tablet by mouth 2 (two) times daily. 60 tablet 3  . spironolactone (ALDACTONE) 25 MG tablet Take 1 tablet (25 mg total) by mouth daily. 30 tablet 0  . tamsulosin (FLOMAX) 0.4 MG CAPS capsule Take 1 capsule (0.4 mg total) by mouth daily. 30 capsule 1  . warfarin (COUMADIN) 3 MG tablet Take 3 mg by mouth daily.     No current facility-administered medications for this visit.      Past Medical History:  Diagnosis Date  . Arthritis   . Atrial fibrillation ([place])    a. s/p multiple cardioversions; failed tikosyn/sotalol.  Marland Kitchen CAD in native artery, s/p cardiac cath with non obstructive CAD 10/24/2016  . CHF (congestive heart failure) ([place])   . DIVERTICULITIS, HX OF  07/25/2007  . DIVERTICULOSIS, COLON 07/22/2007  . Dyspnea   . Edema, peripheral    a. chronic BLE edema, R>L. Prior trauma from dog attack and accident.  Marland Kitchen HYPERLIPIDEMIA 02/03/2008  . Hypersomnia    declines w/u  . Hypertension   . MENOPAUSAL DISORDER 01/09/2011  .  Morbid obesity ([place]) 07/22/2007  . NICM (nonischemic cardiomyopathy) ([place]) 10/24/2016  . Raynaud's syndrome 07/22/2007  . Stroke ([place]) 2017  . THYROID NODULE, RIGHT 01/04/2010  . VITAMIN D DEFICIENCY 01/09/2011   Qualifier: Diagnosis of  By: [name], [name]     Past Surgical History:  Procedure Laterality Date  . CARDIAC CATHETERIZATION N/A 10/23/2016   Procedure: Left Heart Cath and Coronary Angiography;  Surgeon: [name], MD;  Location: [facility];  Service: Cardiovascular;  Laterality: N/A;  . CARDIOVERSION N/A 10/31/2016   Procedure: CARDIOVERSION;  Surgeon: [name], MD;  Location: [place];  Service: Cardiovascular;  Laterality: N/A;  . CARDIOVERSION N/A 11/03/2016   Procedure: CARDIOVERSION;  Surgeon: [name], MD;  Location: [place];  Service: Cardiovascular;  Laterality: N/A;  . CARDIOVERSION N/A 11/18/2016   Procedure: CARDIOVERSION;  Surgeon: [name], MD;  Location: [place];  Service: Cardiovascular;  Laterality: N/A;  . CARDIOVERSION N/A 03/25/2017   Procedure: CARDIOVERSION;  Surgeon: [name], MD;  Location: [facility] ENDOSCOPY;  Service: Cardiovascular;  Laterality: N/A;  . CARDIOVERSION N/A 05/06/2017   Procedure: CARDIOVERSION;  Surgeon: [name], MD;  Location: [facility] ENDOSCOPY;  Service: Cardiovascular;  Laterality: N/A;  . CARDIOVERSION N/A 05/12/2017   Procedure: CARDIOVERSION;  Surgeon: [name], MD;  Location: [facility] ENDOSCOPY;  Service: Cardiovascular;  Laterality: N/A;  . COLONOSCOPY N/A 12/04/2017   Procedure: COLONOSCOPY;  Surgeon: [name], MD;  Location: [place];  Service: Endoscopy;  Laterality: N/A;  . COLONOSCOPY W/ POLYPECTOMY  02/2011   pan diverticulosis.  tubular adenoma without dysplasia on 5 mm sigmoid polyp.  Dr Fuller Plan.    Marland Kitchen PARTIAL HYSTERECTOMY     1 OVARY LEFT  . TEE WITHOUT CARDIOVERSION N/A 10/31/2016   Procedure: TRANSESOPHAGEAL ECHOCARDIOGRAM (TEE);  Surgeon: [name], MD;   Location: [facility] ENDOSCOPY;  Service: Cardiovascular;  Laterality: N/A;  . TEE WITHOUT CARDIOVERSION N/A 03/25/2017   Procedure: TRANSESOPHAGEAL ECHOCARDIOGRAM (TEE);  Surgeon: [name], MD;  Location: [facility] ENDOSCOPY;  Service: Cardiovascular;  Laterality: N/A;    Social History   Socioeconomic History  . Marital status: Single    Spouse name: Not on file  . Number of children: 1  . Years of education: Not on file  . Highest education level: Not on file  Social Needs  . Financial resource strain: Not on file  . Food insecurity - worry: Not on file  . Food insecurity - inability: Not on file  . Transportation needs - medical: Not on file  . Transportation needs - non-medical: Not on file  Occupational History  . Occupation: Academic librarian   Tobacco Use  . Smoking status: Current Every Day Smoker    Packs/day: 0.50    Types: Cigarettes    Last attempt to quit: 10/23/2016    Years since quitting: 1.1  . Smokeless tobacco: Never Used  . Tobacco comment: Smoked for 50 years  Substance and Sexual Activity  . Alcohol use: No    Alcohol/week: 2.4 oz    Types: 4 Cans of beer per week    Frequency: Never    Comment: Intermittent  . Drug use:  No  . Sexual activity: Not on file  Other Topics Concern  . Not on file  Social History Narrative  . Not on file    Family History  Problem Relation Age of Onset  . Asthma Mother   . Diabetes Father   . Heart disease Father        Died of presumed heart attack - 42s  . Lung disease Sister   . Heart disease Sister        Twin sister has heart issue, unclear what kind  . Thyroid disease Neg Hx     ROS: no fevers or chills, productive cough, hemoptysis, dysphasia, odynophagia, melena, hematochezia, dysuria, hematuria, rash, seizure activity, orthopnea, PND, pedal edema, claudication. Remaining systems are negative.  Physical Exam: Well-developed well-nourished in no acute distress.  Skin is warm and dry.  HEENT is normal.  Neck is  supple.  Chest is clear to auscultation with normal expansion.  Cardiovascular exam is regular rate and rhythm.  Abdominal exam nontender or distended. No masses palpated. Extremities show no edema. neuro grossly intact  ECG-sinus rhythm at a rate of 69.  First-degree AV block.  No ST changes.  Personally reviewed  A/P  1 slurred speech-patient is slurring her speech in the office.  No focal neurological findings on examination.  However patient evaluation is concerning.  We will arrange for her to be evaluated in the emergency room.  Her INR is elevated and she will likely need a CT of her head to rule out bleed.  She will also need repeat INR, BUN and creatinine to make sure that she has not developed acute renal failure, urinalysis and white blood cell count.  Note she was performing in and out catheterizations but she discontinued this recently.    2 paroxysmal atrial fibrillation-patient remains in sinus rhythm on examination.  Continue amiodarone.  Patient is scheduled for atrial fibrillation ablation when she is stable by Dr. Curt Bears.  Given elevated INR and slurred speech we will hold Coumadin until CT results known.  3 chronic systolic congestive heart failure-patient is not volume overloaded on examination.  Recent laboratories again show worsening creatinine (? Recurrent urinary retention.  I will hold Spironolactone and Lasix for now.  Hold Entresto until follow-up laboratories available.  If renal function improving we can resume.  I am concerned that her renal function is worsening.  4 cardiomyopathy-continue carvedilol.  We will hold diuretics and Entresto for now.  There are plans to proceed with ICD implantation after atrial fibrillation ablation when patient stable.  5 hypertension-blood pressure is controlled.    6 hyperlipidemia-continue statin.  7 acute on chronic stage III kidney disease-patient's renal function was worse on laboratories obtained January 24 (?recurrent  urinary retention).  We will hold Lasix, Spironolactone and Entresto until follow-up renal function known.  7 tobacco abuse-patient counseled on discontinuing.  8 urinary retention-patient had recent urinary retention but states she is passing her urine now.  Check potassium and renal function.     Kirk [place], MD

## 2017-12-20 NOTE — Assessment & Plan Note (Signed)
I suspect this was reason for f/u, and labs to be don today

## 2017-12-20 NOTE — Assessment & Plan Note (Signed)
[place] for cbc on coumadin,  to f/u any worsening symptoms or concerns

## 2017-12-20 NOTE — Assessment & Plan Note (Signed)
symptomatically improved, declines f/u ua, to f/u with urology as planned, cont same tx

## 2017-12-21 NOTE — Telephone Encounter (Signed)
-----   Message from Biagio Borg, MD sent at 12/18/2017 12:34 PM EST ----- Letter sent, cont same tx  The test results show that your current treatment is OK, except the kidney function is mildly worsening again.  Please continue all of your medications, but we will need to refer you to Nephrology (kidney doctor).    Patricia Wagner to please inform pt, I will do referral..

## 2017-12-21 NOTE — Telephone Encounter (Signed)
Pt has been informed and expressed understanding.  

## 2017-12-28 ENCOUNTER — Encounter: Payer: Self-pay | Admitting: Cardiology

## 2017-12-28 ENCOUNTER — Emergency Department (HOSPITAL_COMMUNITY)
Admission: EM | Admit: 2017-12-28 | Discharge: 2017-12-28 | Discharge status: Against Medical Advice | Payer: BLUE CROSS/BLUE SHIELD

## 2017-12-28 ENCOUNTER — Other Ambulatory Visit: Payer: Self-pay

## 2017-12-28 ENCOUNTER — Ambulatory Visit (INDEPENDENT_AMBULATORY_CARE_PROVIDER_SITE_OTHER): Payer: BLUE CROSS/BLUE SHIELD | Admitting: Pharmacist Clinician (PhC)/ Clinical Pharmacy Specialist

## 2017-12-28 ENCOUNTER — Ambulatory Visit: Payer: BLUE CROSS/BLUE SHIELD | Admitting: Cardiology

## 2017-12-28 VITALS — BP 116/82 | HR 69 | Ht 65.0 in | Wt 226.4 lb

## 2017-12-28 DIAGNOSIS — I428 Other cardiomyopathies: Secondary | ICD-10-CM

## 2017-12-28 DIAGNOSIS — I482 Chronic atrial fibrillation, unspecified: Secondary | ICD-10-CM

## 2017-12-28 DIAGNOSIS — I480 Paroxysmal atrial fibrillation: Secondary | ICD-10-CM | POA: Diagnosis not present

## 2017-12-28 DIAGNOSIS — I5022 Chronic systolic (congestive) heart failure: Secondary | ICD-10-CM

## 2017-12-28 DIAGNOSIS — R4781 Slurred speech: Secondary | ICD-10-CM | POA: Diagnosis not present

## 2017-12-28 DIAGNOSIS — I10 Essential (primary) hypertension: Secondary | ICD-10-CM

## 2017-12-28 DIAGNOSIS — Z7901 Long term (current) use of anticoagulants: Secondary | ICD-10-CM | POA: Diagnosis not present

## 2017-12-28 LAB — POCT INR

## 2017-12-28 NOTE — Patient Instructions (Signed)
Medication Instructions:   HOLD ENTRESTO, SPIRONOLACTONE AND FUROSEMIDE  Follow-Up:  Your physician recommends that you schedule a follow-up appointment LATER THIS WEEK WITH APP  GO TO THE EMERGENCY ROOM FOR EVALUATION

## 2017-12-29 ENCOUNTER — Inpatient Hospital Stay (HOSPITAL_COMMUNITY)
Admission: EM | Admit: 2017-12-29 | Discharge: 2018-01-08 | DRG: 872 | Disposition: A | Discharge status: Home / Self Care | Payer: BLUE CROSS/BLUE SHIELD | Attending: Internal Medicine | Admitting: Internal Medicine

## 2017-12-29 ENCOUNTER — Emergency Department (HOSPITAL_COMMUNITY): Payer: BLUE CROSS/BLUE SHIELD

## 2017-12-29 ENCOUNTER — Encounter (HOSPITAL_COMMUNITY): Payer: Self-pay

## 2017-12-29 ENCOUNTER — Telehealth: Payer: Self-pay | Admitting: *Deleted

## 2017-12-29 ENCOUNTER — Other Ambulatory Visit: Payer: Self-pay

## 2017-12-29 DIAGNOSIS — E559 Vitamin D deficiency, unspecified: Secondary | ICD-10-CM | POA: Diagnosis present

## 2017-12-29 DIAGNOSIS — E873 Alkalosis: Secondary | ICD-10-CM | POA: Insufficient documentation

## 2017-12-29 DIAGNOSIS — R55 Syncope and collapse: Secondary | ICD-10-CM | POA: Diagnosis present

## 2017-12-29 DIAGNOSIS — I428 Other cardiomyopathies: Secondary | ICD-10-CM | POA: Diagnosis present

## 2017-12-29 DIAGNOSIS — T45515A Adverse effect of anticoagulants, initial encounter: Secondary | ICD-10-CM | POA: Diagnosis not present

## 2017-12-29 DIAGNOSIS — N170 Acute kidney failure with tubular necrosis: Secondary | ICD-10-CM

## 2017-12-29 DIAGNOSIS — D649 Anemia, unspecified: Secondary | ICD-10-CM | POA: Diagnosis present

## 2017-12-29 DIAGNOSIS — N19 Unspecified kidney failure: Secondary | ICD-10-CM

## 2017-12-29 DIAGNOSIS — I130 Hypertensive heart and chronic kidney disease with heart failure and stage 1 through stage 4 chronic kidney disease, or unspecified chronic kidney disease: Secondary | ICD-10-CM | POA: Diagnosis present

## 2017-12-29 DIAGNOSIS — E876 Hypokalemia: Secondary | ICD-10-CM | POA: Diagnosis present

## 2017-12-29 DIAGNOSIS — Z888 Allergy status to other drugs, medicaments and biological substances status: Secondary | ICD-10-CM

## 2017-12-29 DIAGNOSIS — J449 Chronic obstructive pulmonary disease, unspecified: Secondary | ICD-10-CM | POA: Diagnosis present

## 2017-12-29 DIAGNOSIS — N183 Chronic kidney disease, stage 3 (moderate): Secondary | ICD-10-CM | POA: Diagnosis present

## 2017-12-29 DIAGNOSIS — N390 Urinary tract infection, site not specified: Secondary | ICD-10-CM | POA: Diagnosis present

## 2017-12-29 DIAGNOSIS — R7881 Bacteremia: Secondary | ICD-10-CM | POA: Diagnosis not present

## 2017-12-29 DIAGNOSIS — Z8673 Personal history of transient ischemic attack (TIA), and cerebral infarction without residual deficits: Secondary | ICD-10-CM | POA: Diagnosis not present

## 2017-12-29 DIAGNOSIS — E785 Hyperlipidemia, unspecified: Secondary | ICD-10-CM | POA: Diagnosis present

## 2017-12-29 DIAGNOSIS — D6832 Hemorrhagic disorder due to extrinsic circulating anticoagulants: Secondary | ICD-10-CM

## 2017-12-29 DIAGNOSIS — I480 Paroxysmal atrial fibrillation: Secondary | ICD-10-CM | POA: Diagnosis present

## 2017-12-29 DIAGNOSIS — Z90710 Acquired absence of both cervix and uterus: Secondary | ICD-10-CM

## 2017-12-29 DIAGNOSIS — R339 Retention of urine, unspecified: Secondary | ICD-10-CM | POA: Diagnosis present

## 2017-12-29 DIAGNOSIS — E860 Dehydration: Secondary | ICD-10-CM

## 2017-12-29 DIAGNOSIS — B9689 Other specified bacterial agents as the cause of diseases classified elsewhere: Secondary | ICD-10-CM | POA: Diagnosis present

## 2017-12-29 DIAGNOSIS — R319 Hematuria, unspecified: Secondary | ICD-10-CM | POA: Diagnosis present

## 2017-12-29 DIAGNOSIS — N179 Acute kidney failure, unspecified: Secondary | ICD-10-CM | POA: Insufficient documentation

## 2017-12-29 DIAGNOSIS — I5022 Chronic systolic (congestive) heart failure: Secondary | ICD-10-CM | POA: Diagnosis present

## 2017-12-29 DIAGNOSIS — Z79899 Other long term (current) drug therapy: Secondary | ICD-10-CM

## 2017-12-29 DIAGNOSIS — I10 Essential (primary) hypertension: Secondary | ICD-10-CM | POA: Diagnosis present

## 2017-12-29 DIAGNOSIS — R791 Abnormal coagulation profile: Secondary | ICD-10-CM | POA: Diagnosis not present

## 2017-12-29 DIAGNOSIS — E872 Acidosis: Secondary | ICD-10-CM | POA: Diagnosis present

## 2017-12-29 DIAGNOSIS — E874 Mixed disorder of acid-base balance: Secondary | ICD-10-CM | POA: Diagnosis present

## 2017-12-29 DIAGNOSIS — A4159 Other Gram-negative sepsis: Principal | ICD-10-CM | POA: Diagnosis present

## 2017-12-29 DIAGNOSIS — Z7901 Long term (current) use of anticoagulants: Secondary | ICD-10-CM

## 2017-12-29 DIAGNOSIS — R4701 Aphasia: Secondary | ICD-10-CM | POA: Diagnosis present

## 2017-12-29 DIAGNOSIS — I7300 Raynaud's syndrome without gangrene: Secondary | ICD-10-CM | POA: Diagnosis present

## 2017-12-29 DIAGNOSIS — E8729 Other acidosis: Secondary | ICD-10-CM | POA: Diagnosis present

## 2017-12-29 DIAGNOSIS — Z6841 Body Mass Index (BMI) 40.0 and over, adult: Secondary | ICD-10-CM | POA: Diagnosis not present

## 2017-12-29 DIAGNOSIS — N189 Chronic kidney disease, unspecified: Secondary | ICD-10-CM

## 2017-12-29 DIAGNOSIS — F17210 Nicotine dependence, cigarettes, uncomplicated: Secondary | ICD-10-CM | POA: Diagnosis present

## 2017-12-29 DIAGNOSIS — N139 Obstructive and reflux uropathy, unspecified: Secondary | ICD-10-CM | POA: Insufficient documentation

## 2017-12-29 DIAGNOSIS — R531 Weakness: Secondary | ICD-10-CM

## 2017-12-29 DIAGNOSIS — D638 Anemia in other chronic diseases classified elsewhere: Secondary | ICD-10-CM | POA: Diagnosis present

## 2017-12-29 DIAGNOSIS — I959 Hypotension, unspecified: Secondary | ICD-10-CM | POA: Diagnosis present

## 2017-12-29 DIAGNOSIS — E869 Volume depletion, unspecified: Secondary | ICD-10-CM | POA: Diagnosis present

## 2017-12-29 DIAGNOSIS — N320 Bladder-neck obstruction: Secondary | ICD-10-CM | POA: Diagnosis present

## 2017-12-29 DIAGNOSIS — I2510 Atherosclerotic heart disease of native coronary artery without angina pectoris: Secondary | ICD-10-CM | POA: Diagnosis present

## 2017-12-29 LAB — URINALYSIS, ROUTINE W REFLEX MICROSCOPIC
Bilirubin Urine: NEGATIVE
Glucose, UA: NEGATIVE mg/dL
Ketones, ur: NEGATIVE mg/dL
NITRITE: NEGATIVE
PH: 8 (ref 5.0–8.0)
PROTEIN: 100 mg/dL — AB
Specific Gravity, Urine: 1.01 (ref 1.005–1.030)

## 2017-12-29 LAB — HEPATIC FUNCTION PANEL
ALT: 14 U/L (ref 14–54)
AST: 16 U/L (ref 15–41)
Albumin: 2.6 g/dL — ABNORMAL LOW (ref 3.5–5.0)
Alkaline Phosphatase: 49 U/L (ref 38–126)
BILIRUBIN TOTAL: 0.8 mg/dL (ref 0.3–1.2)
Bilirubin, Direct: 0.1 mg/dL (ref 0.1–0.5)
Indirect Bilirubin: 0.7 mg/dL (ref 0.3–0.9)
TOTAL PROTEIN: 6.1 g/dL — AB (ref 6.5–8.1)

## 2017-12-29 LAB — RENAL FUNCTION PANEL
Albumin: 2.5 g/dL — ABNORMAL LOW (ref 3.5–5.0)
Anion gap: 16 — ABNORMAL HIGH (ref 5–15)
BUN: 122 mg/dL — AB (ref 6–20)
CALCIUM: 8.2 mg/dL — AB (ref 8.9–10.3)
CO2: 13 mmol/L — ABNORMAL LOW (ref 22–32)
CREATININE: 6.25 mg/dL — AB (ref 0.44–1.00)
Chloride: 108 mmol/L (ref 101–111)
GFR calc Af Amer: 7 mL/min — ABNORMAL LOW (ref >60)
GFR calc non Af Amer: 6 mL/min — ABNORMAL LOW (ref >60)
GLUCOSE: 75 mg/dL (ref 65–99)
Phosphorus: 4.5 mg/dL (ref 2.5–4.6)
Potassium: 3.9 mmol/L (ref 3.5–5.1)
SODIUM: 137 mmol/L (ref 135–145)

## 2017-12-29 LAB — BASIC METABOLIC PANEL
ANION GAP: 15 (ref 5–15)
BUN: 129 mg/dL — ABNORMAL HIGH (ref 6–20)
CALCIUM: 8.3 mg/dL — AB (ref 8.9–10.3)
CO2: 14 mmol/L — ABNORMAL LOW (ref 22–32)
CREATININE: 7.15 mg/dL — AB (ref 0.44–1.00)
Chloride: 106 mmol/L (ref 101–111)
GFR, EST AFRICAN AMERICAN: 6 mL/min — AB (ref >60)
GFR, EST NON AFRICAN AMERICAN: 5 mL/min — AB (ref >60)
Glucose, Bld: 87 mg/dL (ref 65–99)
Potassium: 4.2 mmol/L (ref 3.5–5.1)
SODIUM: 135 mmol/L (ref 135–145)

## 2017-12-29 LAB — I-STAT TROPONIN, ED: TROPONIN I, POC: 0.04 ng/mL (ref 0.00–0.08)

## 2017-12-29 LAB — CBC
HCT: 33.4 % — ABNORMAL LOW (ref 36.0–46.0)
HEMOGLOBIN: 10.9 g/dL — AB (ref 12.0–15.0)
MCH: 30.4 pg (ref 26.0–34.0)
MCHC: 32.6 g/dL (ref 30.0–36.0)
MCV: 93.3 fL (ref 78.0–100.0)
PLATELETS: 194 10*3/uL (ref 150–400)
RBC: 3.58 MIL/uL — AB (ref 3.87–5.11)
RDW: 15.6 % — ABNORMAL HIGH (ref 11.5–15.5)
WBC: 15.6 10*3/uL — ABNORMAL HIGH (ref 4.0–10.5)

## 2017-12-29 LAB — PROTIME-INR
INR: 8.61
Prothrombin Time: 70.4 seconds — ABNORMAL HIGH (ref 11.4–15.2)

## 2017-12-29 LAB — I-STAT CG4 LACTIC ACID, ED: Lactic Acid, Venous: 0.47 mmol/L — ABNORMAL LOW (ref 0.5–1.9)

## 2017-12-29 MED ORDER — PIPERACILLIN-TAZOBACTAM 3.375 G IVPB 30 MIN: 3.3750 g | Freq: Once | INTRAVENOUS | Status: DC

## 2017-12-29 MED ORDER — WARFARIN - PHARMACIST DOSING INPATIENT: Freq: Every day | Status: DC

## 2017-12-29 MED ORDER — VITAMIN K1 10 MG/ML IJ SOLN
5.0000 mg | Freq: Once | INTRAVENOUS | Status: AC
  Administered 2017-12-29: 5 mg via INTRAVENOUS
  Filled 2017-12-29: qty 0.5

## 2017-12-29 MED ORDER — SODIUM CHLORIDE 0.9 % IV BOLUS (SEPSIS)
1000.0000 mL | Freq: Once | INTRAVENOUS | Status: AC
  Administered 2017-12-29: 1000 mL via INTRAVENOUS

## 2017-12-29 MED ORDER — VANCOMYCIN HCL 10 G IV SOLR
2000.0000 mg | Freq: Once | INTRAVENOUS | Status: AC
  Administered 2017-12-29: 2000 mg via INTRAVENOUS
  Filled 2017-12-29: qty 2000

## 2017-12-29 MED ORDER — VANCOMYCIN HCL IN DEXTROSE 1-5 GM/200ML-% IV SOLN: 1000.0000 mg | Freq: Once | INTRAVENOUS | Status: DC

## 2017-12-29 MED ORDER — SODIUM CHLORIDE 0.9 % IV BOLUS (SEPSIS)
500.0000 mL | Freq: Once | INTRAVENOUS | Status: AC
  Administered 2017-12-29: 500 mL via INTRAVENOUS

## 2017-12-29 MED ORDER — PIPERACILLIN-TAZOBACTAM 3.375 G IVPB
3.3750 g | Freq: Three times a day (TID) | INTRAVENOUS | Status: DC
  Administered 2017-12-29: 3.375 g via INTRAVENOUS
  Filled 2017-12-29: qty 50

## 2017-12-29 MED ORDER — VANCOMYCIN HCL 10 G IV SOLR: 1500.0000 mg | INTRAVENOUS | Status: DC

## 2017-12-29 MED ORDER — DEXTROSE 5 % IV SOLN
1.0000 g | INTRAVENOUS | Status: DC
  Administered 2017-12-29: 1 g via INTRAVENOUS
  Filled 2017-12-29: qty 10

## 2017-12-29 MED ORDER — STERILE WATER FOR INJECTION IV SOLN
150.0000 meq | INTRAVENOUS | Status: DC
  Filled 2017-12-29 (×3): qty 850

## 2017-12-29 NOTE — ED Notes (Signed)
Attempted report 

## 2017-12-29 NOTE — Progress Notes (Signed)
  Pt admitted to the unit. Pt is stable, alert and oriented per baseline. Oriented to room, staff, and call bell. Educated to call for any assistance. Bed in lowest position, call bell within reach- will continue to monitor. 

## 2017-12-29 NOTE — Progress Notes (Signed)
Pharmacy Antibiotic Note  Patricia Wagner is a 67 y.o. female admitted on 12/29/2017 with sepsis.  Pharmacy has been consulted for vancomycin and zosyn dosing. Previous SCr ~ 1.3-1.4, and presenting now with 2.42 (normalized CrCl 26 mL/min).  WBC 15.6, afebrile and hypotensive.    Plan: Zosyn 3.375mg  IV q 8h Vancomycin 2000mg  IV x 1 dose, then 1500mg  q 48h Monitor LOT, cultures, renal function     Temp (24hrs), Avg:98.1 F (36.7 C), Min:98.1 F (36.7 C), Max:98.1 F (36.7 C)  Recent Labs  Lab 12/29/17 1126  WBC 15.6*    Estimated Creatinine Clearance: 27.9 mL/min (A) (by C-G formula based on SCr of 2.42 mg/dL (H)).    Allergies  Allergen Reactions  . Ace Inhibitors Palpitations    Antimicrobials this admission: Vanc 2/5 >> Zosyn 2/5 >>  Dose adjustments this admission: n/a  Microbiology results: BCx 2/5: pend CDiff PCR 2/5: pend  Bertis Ruddy, PharmD Pharmacy Resident Pager #: [phone] 12/29/2017 1:39 PM

## 2017-12-29 NOTE — ED Notes (Signed)
Pt in CT at this time.

## 2017-12-29 NOTE — ED Provider Notes (Addendum)
[facility] EMERGENCY DEPARTMENT Provider Note   CSN: [id number] Arrival date & time: 12/29/17  1015     History   Chief Complaint Chief Complaint  Patient presents with  . Aphasia  . Weakness    HPI Patricia Wagner is a 67 y.o. female.  Patient with hx afib, presents c/o diarrhea and generalized weakness over the past 4 days. Symptoms gradual onset, persistent. Several loose stools. Feels faint/lightheaded when stands. Poor po intake for past few days. No vomiting. Denies fever/chills. No cough or sore throat. Denies chest pain or sob. No abd pain. No dysuria or gu c/o. Family also notes slurred speech for the past few days. pts cardiologist sent to ED yesterday for evaluation, but patient states wait was long so went home.    The history is provided by the patient and a relative.  Weakness  Pertinent negatives include no shortness of breath, no chest pain, no confusion and no headaches.    Past Medical History:  Diagnosis Date  . Arthritis   . Atrial fibrillation ([place])    a. s/p multiple cardioversions; failed tikosyn/sotalol.  Marland Kitchen CAD in native artery, s/p cardiac cath with non obstructive CAD 10/24/2016  . CHF (congestive heart failure) ([place])   . DIVERTICULITIS, HX OF 07/25/2007  . DIVERTICULOSIS, COLON 07/22/2007  . Dyspnea   . Edema, peripheral    a. chronic BLE edema, R>L. Prior trauma from dog attack and accident.  Marland Kitchen HYPERLIPIDEMIA 02/03/2008  . Hypersomnia    declines w/u  . Hypertension   . MENOPAUSAL DISORDER 01/09/2011  . Morbid obesity ([place]) 07/22/2007  . NICM (nonischemic cardiomyopathy) ([place]) 10/24/2016  . Raynaud's syndrome 07/22/2007  . Stroke ([place]) 2017  . THYROID NODULE, RIGHT 01/04/2010  . VITAMIN D DEFICIENCY 01/09/2011   Qualifier: Diagnosis of  By: [name], [name]     Patient Active Problem List   Diagnosis Date Noted  . Anemia 12/17/2017  . AKI (acute kidney injury) ([place]) 11/30/2017  . Subtherapeutic international normalized ratio  (INR) 11/30/2017  . Diarrhea 11/30/2017  . Urinary retention 11/23/2017  . Lower abdominal pain 11/23/2017  . Dysuria 07/21/2017  . Suspected sleep apnea 05/20/2017  . CKD (chronic kidney disease), stage III ([place]) 05/20/2017  . Pressure injury of skin 05/02/2017  . Acute hypercapnic respiratory failure ([place]) 04/30/2017  . Hypotension (arterial) 04/30/2017  . Atrial fibrillation ([place])   . Bradycardia 11/20/2016  . Stroke ([place]) 11/20/2016  . Cerebrovascular accident (CVA) due to embolism of left cerebellar artery ([place])   . Persistent atrial fibrillation ([place]) 11/03/2016  . Paroxysmal atrial fibrillation ([facility])   . CHF (congestive heart failure) ([place]) 10/31/2016  . Nonischemic cardiomyopathy ([place]) 10/24/2016  . CAD in native artery, s/p cardiac cath with non obstructive CAD 10/24/2016  . Tobacco abuse 10/20/2016  . Acute renal failure superimposed on chronic kidney disease ([place]) 10/20/2016  . Hematochezia 06/29/2013  . Degenerative arthritis of left knee 03/15/2012  . Encounter for well adult exam with abnormal findings 03/12/2012  . Hypersomnia   . VITAMIN D DEFICIENCY 01/09/2011  . MENOPAUSAL DISORDER 01/09/2011  . THYROID NODULE, RIGHT 01/04/2010  . HLD (hyperlipidemia) 02/03/2008  . LEG CRAMPS, NOCTURNAL 02/03/2008  . LOW BACK PAIN 07/25/2007  . Morbid obesity ([place]) 07/22/2007  . Essential hypertension 07/22/2007  . Raynaud's syndrome 07/22/2007  . Diverticulosis of colon with hemorrhage 07/22/2007  . SYMPTOM, EDEMA 07/22/2007    Past Surgical History:  Procedure Laterality Date  . CARDIAC CATHETERIZATION N/A 10/23/2016  Procedure: Left Heart Cath and Coronary Angiography;  Surgeon: [name], MD;  Location: [facility];  Service: Cardiovascular;  Laterality: N/A;  . CARDIOVERSION N/A 10/31/2016   Procedure: CARDIOVERSION;  Surgeon: [name], MD;  Location: [facility];  Service: Cardiovascular;  Laterality: N/A;  . CARDIOVERSION N/A 11/03/2016    Procedure: CARDIOVERSION;  Surgeon: [name], MD;  Location: [facility];  Service: Cardiovascular;  Laterality: N/A;  . CARDIOVERSION N/A 11/18/2016   Procedure: CARDIOVERSION;  Surgeon: [name], MD;  Location: [facility];  Service: Cardiovascular;  Laterality: N/A;  . CARDIOVERSION N/A 03/25/2017   Procedure: CARDIOVERSION;  Surgeon: [name], MD;  Location: [facility] ENDOSCOPY;  Service: Cardiovascular;  Laterality: N/A;  . CARDIOVERSION N/A 05/06/2017   Procedure: CARDIOVERSION;  Surgeon: [name], MD;  Location: [facility] ENDOSCOPY;  Service: Cardiovascular;  Laterality: N/A;  . CARDIOVERSION N/A 05/12/2017   Procedure: CARDIOVERSION;  Surgeon: [name], MD;  Location: [facility]OSCOPY;  Service: Cardiovascular;  Laterality: N/A;  . COLONOSCOPY N/A 12/04/2017   Procedure: COLONOSCOPY;  Surgeon: [name], MD;  Location: [facility];  Service: Endoscopy;  Laterality: N/A;  . COLONOSCOPY W/ POLYPECTOMY  02/2011   pan diverticulosis.  tubular adenoma without dysplasia on 5 mm sigmoid polyp.  Dr Fuller Plan.    Marland Kitchen PARTIAL HYSTERECTOMY     1 OVARY LEFT  . TEE WITHOUT CARDIOVERSION N/A 10/31/2016   Procedure: TRANSESOPHAGEAL ECHOCARDIOGRAM (TEE);  Surgeon: [name], MD;  Location[facility] ENDOSCOPY;  Service: Cardiovascular;  Laterality: N/A;  . TEE WITHOUT CARDIOVERSION N/A 03/25/2017   Procedure: TRANSESOPHAGEAL ECHOCARDIOGRAM (TEE);  Surgeon: [name], MD;  Location: [facility] ENDOSCOPY;  Service: Cardiovascular;  Laterality: N/A;    OB History    No data available       Home Medications    Prior to Admission medications   Medication Sig Start Date End Date Taking? Authorizing Provider  acetaminophen (TYLENOL) 325 MG tablet Take 650 mg by mouth every 6 (six) hours as needed (pain).     [provider]  amiodarone (PACERONE) 200 MG tablet TAKE 1 TABLET BY MOUTH EVERY DAY 12/04/17   Bensimhon, Shaune Pascal, MD  carvedilol (COREG) 3.125 MG tablet Take 1  tablet (3.125 mg total) by mouth 2 (two) times daily. 07/09/17 12/28/17  [name], MD  diclofenac sodium (VOLTAREN) 1 % GEL Apply 4 g topically 4 (four) times daily as needed (As directed for skin). Patient taking differently: Apply 4 g topically 4 (four) times daily as needed (for pain).  10/23/17   Strader, Fransisco Hertz, PA-C  furosemide (LASIX) 20 MG tablet Take 1 tablet (20 mg total) by mouth daily. 10/27/17   Erma Heritage, PA-C  Investigational - Study Medication Take 1 tablet by mouth 2 (two) times daily. Study name: Galactic HF Study  Additional study details: Omecamtiv Mecarbil or Placebo 09/17/17   Larey Dresser, MD  isosorbide-hydrALAZINE (BIDIL) 20-37.5 MG tablet Take 1 tablet by mouth 3 (three) times daily. Patient taking differently: Take 1 tablet by mouth 2 (two) times daily.  10/20/17   [name], MD  ranolazine (RANEXA) 500 MG 12 hr tablet Take 1 tablet (500 mg total) by mouth 2 (two) times daily. 09/14/17   Bensimhon, Shaune Pascal, MD  rosuvastatin (CRESTOR) 10 MG tablet TAKE 1 TABLET (10 MG TOTAL) BY MOUTH DAILY. 10/07/17   Biagio Borg, MD  sacubitril-valsartan (ENTRESTO) 49-51 MG Take 1 tablet by mouth 2 (two) times  daily. 08/19/17   Bensimhon, Shaune Pascal, MD  spironolactone (ALDACTONE) 25 MG tablet Take 1 tablet (25 mg total) by mouth daily. 12/09/17   Nita Sells, MD  tamsulosin (FLOMAX) 0.4 MG CAPS capsule Take 1 capsule (0.4 mg total) by mouth daily. 11/23/17   Biagio Borg, MD  warfarin (COUMADIN) 3 MG tablet Take 3 mg by mouth daily.    [provider]    Family History Family History  Problem Relation Age of Onset  . Asthma Mother   . Diabetes Father   . Heart disease Father        Died of presumed heart attack - 79s  . Lung disease Sister   . Heart disease Sister        Twin sister has heart issue, unclear what kind  . Thyroid disease Neg Hx     Social History Social History   Tobacco Use  . Smoking status: Current Every  Day Smoker    Packs/day: 0.50    Types: Cigarettes    Last attempt to quit: 10/23/2016    Years since quitting: 1.1  . Smokeless tobacco: Never Used  . Tobacco comment: Smoked for 50 years  Substance Use Topics  . Alcohol use: No    Alcohol/week: 2.4 oz    Types: 4 Cans of beer per week    Frequency: Never    Comment: Intermittent  . Drug use: No     Allergies   Ace inhibitors   Review of Systems Review of Systems  Constitutional: Negative for fever.  HENT: Negative for sore throat.   Eyes: Negative for visual disturbance.  Respiratory: Negative for shortness of breath.   Cardiovascular: Negative for chest pain.  Gastrointestinal: Positive for diarrhea. Negative for abdominal pain.  Endocrine: Negative for polyuria.  Genitourinary: Negative for dysuria and flank pain.  Musculoskeletal: Negative for back pain and neck pain.  Skin: Negative for rash.  Neurological: Positive for weakness. Negative for headaches.  Hematological: Does not bruise/bleed easily.  Psychiatric/Behavioral: Negative for confusion.     Physical Exam Updated Vital Signs BP (!) 73/36 (BP Location: Left Arm)   Pulse 70   Temp 98.1 F (36.7 C) (Oral)   Resp 20   SpO2 95%   Physical Exam  Constitutional:  Weak appearing, low bp.   HENT:  Head: Atraumatic.  Eyes: Conjunctivae are normal. Pupils are equal, round, and reactive to light. No scleral icterus.  Neck: Neck supple. No tracheal deviation present. No thyromegaly present.  Cardiovascular: Normal rate, regular rhythm, normal heart sounds and intact distal pulses.  No murmur heard. Pulmonary/Chest: Effort normal and breath sounds normal. No respiratory distress.  Abdominal: Soft. Normal appearance and bowel sounds are normal. She exhibits no distension. There is no tenderness.  Genitourinary:  Genitourinary Comments: No cva tenderness  Musculoskeletal: She exhibits no edema.  Neurological: She is alert.  Speech clear/fluent. Motor  intact bil, stre 5/5. sens grossly intact.   Skin: Skin is warm and dry. No rash noted.  Psychiatric: She has a normal mood and affect.  Nursing note and vitals reviewed.    ED Treatments / Results  Labs (all labs ordered are listed, but only abnormal results are displayed) Results for orders placed or performed during the hospital encounter of 12/29/17  CBC  Result Value Ref Range   WBC 15.6 (H) 4.0 - 10.5 K/uL   RBC 3.58 (L) 3.87 - 5.11 MIL/uL   Hemoglobin 10.9 (L) 12.0 - 15.0 g/dL   HCT 33.4 (  L) 36.0 - 46.0 %   MCV 93.3 78.0 - 100.0 fL   MCH 30.4 26.0 - 34.0 pg   MCHC 32.6 30.0 - 36.0 g/dL   RDW 15.6 (H) 11.5 - 15.5 %   Platelets 194 150 - 400 K/uL  Urinalysis, Routine w reflex microscopic  Result Value Ref Range   Color, Urine AMBER (A) YELLOW   APPearance CLOUDY (A) CLEAR   Specific Gravity, Urine 1.010 1.005 - 1.030   pH 8.0 5.0 - 8.0   Glucose, UA NEGATIVE NEGATIVE mg/dL   Hgb urine dipstick SMALL (A) NEGATIVE   Bilirubin Urine NEGATIVE NEGATIVE   Ketones, ur NEGATIVE NEGATIVE mg/dL   Protein, ur 100 (A) NEGATIVE mg/dL   Nitrite NEGATIVE NEGATIVE   Leukocytes, UA LARGE (A) NEGATIVE   RBC / HPF 0-5 0 - 5 RBC/hpf   WBC, UA 6-30 0 - 5 WBC/hpf   Bacteria, UA MANY (A) NONE SEEN   Squamous Epithelial / LPF 0-5 (A) NONE SEEN   Mucus PRESENT   Protime-INR  Result Value Ref Range   Prothrombin Time 70.4 (H) 11.4 - 15.2 seconds   INR 8.61 (HH)   Hepatic function panel  Result Value Ref Range   Total Protein 6.1 (L) 6.5 - 8.1 g/dL   Albumin 2.6 (L) 3.5 - 5.0 g/dL   AST 16 15 - 41 U/L   ALT 14 14 - 54 U/L   Alkaline Phosphatase 49 38 - 126 U/L   Total Bilirubin 0.8 0.3 - 1.2 mg/dL   Bilirubin, Direct 0.1 0.1 - 0.5 mg/dL   Indirect Bilirubin 0.7 0.3 - 0.9 mg/dL  Basic metabolic panel  Result Value Ref Range   Sodium 135 135 - 145 mmol/L   Potassium 4.2 3.5 - 5.1 mmol/L   Chloride 106 101 - 111 mmol/L   CO2 14 (L) 22 - 32 mmol/L   Glucose, Bld 87 65 - 99 mg/dL    BUN 129 (H) 6 - 20 mg/dL   Creatinine, Ser 7.15 (H) 0.44 - 1.00 mg/dL   Calcium 8.3 (L) 8.9 - 10.3 mg/dL   GFR calc non Af Amer 5 (L) >60 mL/min   GFR calc Af Amer 6 (L) >60 mL/min   Anion gap 15 5 - 15  I-Stat Troponin, ED (not at [facility])  Result Value Ref Range   Troponin i, poc 0.04 0.00 - 0.08 ng/mL   Comment 3          I-Stat CG4 Lactic Acid, ED  (not at  [facility])  Result Value Ref Range   Lactic Acid, Venous 0.47 (L) 0.5 - 1.9 mmol/L   Ct Abdomen Pelvis Wo Contrast  Result Date: 12/02/2017 CLINICAL DATA:  Obstructive uropathy at level of bladder. Lower abdominal pain and hematuria. EXAM: CT ABDOMEN AND PELVIS WITHOUT CONTRAST TECHNIQUE: Multidetector CT imaging of the abdomen and pelvis was performed following the standard protocol without IV contrast. COMPARISON:  Renal ultrasound yesterday. FINDINGS: Lower chest: The lung bases are clear. Mild cardiomegaly. There are coronary artery calcifications. Hepatobiliary: No focal hepatic lesion allowing for lack contrast. Clips in the gallbladder fossa postcholecystectomy. No biliary dilatation. Pancreas: No ductal dilatation or inflammation. Spleen: Normal in size without focal abnormality. Adrenals/Urinary Tract: Low-density left adrenal nodule, partially obscured by motion, however consistent with adenoma. Small low-density right adrenal nodule measuring 14 mm may be an adenoma or myelolipoma. Mild prominence of bilateral renal collecting systems without frank hydronephrosis, right greater than left. Mild symmetric perinephric edema. Cyst in the interpolar  left kidney measures approximately 3.5 cm. Cyst in the right kidney measures 17 mm. No urolithiasis. Foley catheter decompressing the urinary bladder. There is diffuse bladder wall thickening. Intravesicular air likely related to Foley catheter. No external vesicular mass effect. Stomach/Bowel: Colonic diverticulosis throughout the colon. No diverticulitis. Normal appendix. No bowel inflammation or  obstruction. The stomach is nondistended. Lack of enteric contrast limits detailed bowel assessment. Vascular/Lymphatic: Aortic and branch atherosclerosis. No aneurysm. No bulky adenopathy, lack of contrast limits assessment. Reproductive: Post hysterectomy with prominence of the vaginal cuff. No adnexal mass. Possible right ovary in situ, quiescent. Other: No ascites or free air. No intra-abdominal abscess. Small fat containing umbilical hernia. Musculoskeletal: Multilevel degenerative change throughout the lumbar spine. There are no acute or suspicious osseous abnormalities. Intramuscular lipoma within right obturator muscle, incidental. IMPRESSION: 1. No urolithiasis. Foley catheter in place with bladder wall thickening. Prominence of both renal collecting systems without frank hydronephrosis. No evidence of obstructive uropathy or bladder outlet obstruction or obstructive uropathy. 2. Colonic diverticulosis without diverticulitis. 3. Bilateral renal cysts. 4. Incidental findings of left adrenal adenoma right adrenal adenoma versus myelolipoma, and Aortic Atherosclerosis (ICD10-I70.0). Electronically Signed   By: Jeb Levering M.D.   On: 12/02/2017 02:34   US Renal  Result Date: 12/01/2017 CLINICAL DATA:  Acute renal injury EXAM: RENAL / URINARY TRACT ULTRASOUND COMPLETE COMPARISON:  04/29/2017 FINDINGS: Limited by patient body habitus and overlying bowel. Right Kidney: Length: 9.9 cm. Echogenicity within normal limits. No mass or hydronephrosis visualized. Left Kidney: Length: 10.4 cm. Echogenicity within normal limits. No nephrolithiasis nor hydronephrosis. Slight exophytic anechoic interpolar renal cyst measuring 2.6 x 2.6 x 2.7 cm. Bladder: Decompressed by Foley catheter. IMPRESSION: Interpolar left renal cyst measuring 2.6 x 2.6 x 2.7 cm. Through transmission is noted. No suspicious septations or nodular foci noted within. Maintained cortical-medullary distinction of the kidneys. Electronically Signed    By: Ashley Royalty M.D.   On: 12/01/2017 03:22   Dg Chest Port 1 View  Result Date: 11/30/2017 CLINICAL DATA:  Sepsis and abdominal pain EXAM: PORTABLE CHEST 1 VIEW COMPARISON:  Chest radiograph 10/22/2017 FINDINGS: Mild cardiomegaly, unchanged. No focal airspace consolidation. No pneumothorax or sizable pleural effusion. No pulmonary edema. Mild aortic arch calcific atherosclerosis. IMPRESSION: Unchanged cardiomegaly and aortic atherosclerosis (ICD10-I70.0) without focal airspace disease. Electronically Signed   By: Ulyses Jarred M.D.   On: 11/30/2017 16:44    EKG  EKG Interpretation  Date/Time:  Tuesday December 29 2017 11:16:51 EST Ventricular Rate:  70 PR Interval:  206 QRS Duration: 104 QT Interval:  438 QTC Calculation: 473 R Axis:   93 Text Interpretation:  Sinus rhythm Non-specific intra-ventricular conduction delay Non-specific ST-t changes Baseline wander Confirmed by [name] [id number]) on 12/29/2017 1:23:12 PM       Radiology Dg Chest Port 1 View  Result Date: 12/29/2017 CLINICAL DATA:  Fever and weakness EXAM: PORTABLE CHEST 1 VIEW COMPARISON:  11/30/2017 FINDINGS: Cardiac shadow is stable. Aortic calcifications are noted. Lungs are well aerated bilaterally. Degenerative changes of the thoracic spine are seen. IMPRESSION: No acute abnormality noted. Electronically Signed   By: Inez Catalina M.D.   On: 12/29/2017 12:17    Procedures Procedures (including critical care time)  Medications Ordered in ED Medications  sodium chloride 0.9 % bolus 1,000 mL (not administered)    And  sodium chloride 0.9 % bolus 1,000 mL (not administered)    And  sodium chloride 0.9 % bolus 1,000 mL (not administered)    And  sodium  chloride 0.9 % bolus 500 mL (not administered)     Initial Impression / Assessment and Plan / ED Course  I have reviewed the triage vital signs and the nursing notes.  Pertinent labs & imaging results that were available during my care of the patient were  reviewed by me and considered in my medical decision making (see chart for details).  Iv ns bolus. 2nd iv line as hypotensive. Cultures sent.  Continuous pulse ox and monitor.   Additional iv ns bolus.   Empiric iv abx given re hypotension, possible sepsis.  On bedside u/s, bladder appears very distended. Foley catheter ordered.  Reviewed nursing notes and prior charts for additional history. During recent hospital stay, AKI w obstructive uropathy - suspected recurrent process.  Additional ns iv.   Renal consullted.  Hospitalists also consulted for admission.   CRITICAL CARE  RE hyptension, Acute renal failure/obstructive uropathy.  Performed by: Mirna Mires Total critical care time: 40 minutes Critical care time was exclusive of separately billable procedures and treating other patients. Critical care was necessary to treat or prevent imminent or life-threatening deterioration. Critical care was time spent personally by me on the following activities: development of treatment plan with patient and/or surrogate as well as nursing, discussions with consultants, evaluation of patient's response to treatment, examination of patient, obtaining history from patient or surrogate, ordering and performing treatments and interventions, ordering and review of laboratory studies, ordering and review of radiographic studies, pulse oximetry and re-evaluation of patient's condition.  Discussed with hospitalists - will admit to SDU.  Discussed with Dr Posey Pronto from [facility] - he will see in consult.  Final Clinical Impressions(s) / ED Diagnoses   Final diagnoses:  None    ED Discharge Orders    None            [name], MD 12/29/17 1610

## 2017-12-29 NOTE — ED Notes (Signed)
Pt continues to rest with eyes closed.

## 2017-12-29 NOTE — Consult Note (Signed)
Reason for Consult: Acute kidney injury on chronic kidney disease stage III Referring Physician: [name] M.D. (EDP)  HPI:  67 year old Latin American woman with past medical history significant for congestive heart failure, atrial fibrillation on anticoagulation with Coumadin, history of CVA, history of diverticulosis, coronary artery disease and Raynaud syndrome with underlying chronic kidney disease and a baseline creatinine of around 1.4 when discharged following a hospitalization in which he had acute kidney injury secondary to urinary retention treated by alleviation of bladder outlet obstruction. Upon discharge, she was sent out with an indwelling Foley catheter and outpatient follow-up during which she was transitioned to intermittent self-catheterization. She was seen yesterday by Dr. [name] cardiology who noted her to have slurred speech and also noticed an elevation of her creatinine from 1.4 on 1/15 up to 2.4 on 1/24 prompting cessation of spironolactone, Entresto and furosemide until she was evaluated in the emergency room.   On presentation here today, she complained of generalized weakness and some preceding diarrhea/poor appetite with symptoms of orthostatic dizziness/presyncope. She denied any other changes with urinary pattern however when she had a Foley catheter placed in the emergency room, she had about 1.5 L urine output that is cloudy and blood-tinged with a supratherapeutic INR of 8.0. She was found to have a creatinine of 7.15/BUN 129 and bicarbonate of 14 with normal potassium of 4.2.  Past Medical History:  Diagnosis Date  . Arthritis   . Atrial fibrillation ([place])    a. s/p multiple cardioversions; failed tikosyn/sotalol.  Marland Kitchen CAD in native artery, s/p cardiac cath with non obstructive CAD 10/24/2016  . CHF (congestive heart failure) ([place])   . DIVERTICULITIS, HX OF 07/25/2007  . DIVERTICULOSIS, COLON 07/22/2007  . Dyspnea   . Edema, peripheral    a. chronic BLE edema,  R>L. Prior trauma from dog attack and accident.  Marland Kitchen HYPERLIPIDEMIA 02/03/2008  . Hypersomnia    declines w/u  . Hypertension   . MENOPAUSAL DISORDER 01/09/2011  . Morbid obesity ([place]) 07/22/2007  . NICM (nonischemic cardiomyopathy) ([place]) 10/24/2016  . Raynaud's syndrome 07/22/2007  . Stroke ([place]) 2017  . THYROID NODULE, RIGHT 01/04/2010  . VITAMIN D DEFICIENCY 01/09/2011   Qualifier: Diagnosis of  By: [name], [name]     Past Surgical History:  Procedure Laterality Date  . CARDIAC CATHETERIZATION N/A 10/23/2016   Procedure: Left Heart Cath and Coronary Angiography;  Surgeon: [name], MD;  Location: [place];  Service: Cardiovascular;  Laterality: N/A;  . CARDIOVERSION N/A 10/31/2016   Procedure: CARDIOVERSION;  Surgeon: [name], MD;  Location: [place];  Service: Cardiovascular;  Laterality: N/A;  . CARDIOVERSION N/A 11/03/2016   Procedure: CARDIOVERSION;  Surgeon: [name], MD;  Location: [place];  Service: Cardiovascular;  Laterality: N/A;  . CARDIOVERSION N/A 11/18/2016   Procedure: CARDIOVERSION;  Surgeon: [name], MD;  Location: [place];  Service: Cardiovascular;  Laterality: N/A;  . CARDIOVERSION N/A 03/25/2017   Procedure: CARDIOVERSION;  Surgeon: [name], MD;  Location: [facility] ENDOSCOPY;  Service: Cardiovascular;  Laterality: N/A;  . CARDIOVERSION N/A 05/06/2017   Procedure: CARDIOVERSION;  Surgeon: [name], MD;  Location: [facility] ENDOSCOPY;  Service: Cardiovascular;  Laterality: N/A;  . CARDIOVERSION N/A 05/12/2017   Procedure: CARDIOVERSION;  Surgeon: [name], MD;  Location: [facility] ENDOSCOPY;  Service: Cardiovascular;  Laterality: N/A;  . COLONOSCOPY N/A 12/04/2017   Procedure: COLONOSCOPY;  Surgeon: [name], MD;  Location: [place];  Service: Endoscopy;  Laterality:  N/A;  . COLONOSCOPY W/ POLYPECTOMY  02/2011   pan diverticulosis.  tubular adenoma without dysplasia on 5 mm sigmoid polyp.  Dr Fuller Plan.     Marland Kitchen PARTIAL HYSTERECTOMY     1 OVARY LEFT  . TEE WITHOUT CARDIOVERSION N/A 10/31/2016   Procedure: TRANSESOPHAGEAL ECHOCARDIOGRAM (TEE);  Surgeon: [name], MD;  Location: [facility] ENDOSCOPY;  Service: Cardiovascular;  Laterality: N/A;  . TEE WITHOUT CARDIOVERSION N/A 03/25/2017   Procedure: TRANSESOPHAGEAL ECHOCARDIOGRAM (TEE);  Surgeon: [name], MD;  Location: [facility] ENDOSCOPY;  Service: Cardiovascular;  Laterality: N/A;    Family History  Problem Relation Age of Onset  . Asthma Mother   . Diabetes Father   . Heart disease Father        Died of presumed heart attack - 66s  . Lung disease Sister   . Heart disease Sister        Twin sister has heart issue, unclear what kind  . Thyroid disease Neg Hx     Social History:  reports that she has been smoking cigarettes.  She has been smoking about 0.50 packs per day. she has never used smokeless tobacco. She reports that she does not drink alcohol or use drugs.  Allergies:  Allergies  Allergen Reactions  . Ace Inhibitors Palpitations    Medications: Scheduled:   Results for orders placed or performed during the hospital encounter of 12/29/17 (from the past 48 hour(s))  CBC     Status: Abnormal   Collection Time: 12/29/17 11:26 AM  Result Value Ref Range   WBC 15.6 (H) 4.0 - 10.5 K/uL   RBC 3.58 (L) 3.87 - 5.11 MIL/uL   Hemoglobin 10.9 (L) 12.0 - 15.0 g/dL   HCT 33.4 (L) 36.0 - 46.0 %   MCV 93.3 78.0 - 100.0 fL   MCH 30.4 26.0 - 34.0 pg   MCHC 32.6 30.0 - 36.0 g/dL   RDW 15.6 (H) 11.5 - 15.5 %   Platelets 194 150 - 400 K/uL    Comment: Performed at [facility] [address], [place], [place] [address]  Protime-INR     Status: Abnormal   Collection Time: 12/29/17 11:26 AM  Result Value Ref Range   Prothrombin Time 70.4 (H) 11.4 - 15.2 seconds   INR 8.61 (HH)     Comment: REPEATED TO VERIFY CRITICAL RESULT CALLED TO, READ BACK BY AND VERIFIED WITH: C.ROWE,RN 12/29/17 1231 BY BSLADE Performed at [facility], [place] [address] [place], [place] [address]   I-Stat Troponin, ED (not at [facility])     Status: None   Collection Time: 12/29/17 11:47 AM  Result Value Ref Range   Troponin i, poc 0.04 0.00 - 0.08 ng/mL   Comment 3            Comment: Due to the release kinetics of cTnI, a negative result within the first hours of the onset of symptoms does not rule out myocardial infarction with certainty. If myocardial infarction is still suspected, repeat the test at appropriate intervals.   Hepatic function panel     Status: Abnormal   Collection Time: 12/29/17 11:56 AM  Result Value Ref Range   Total Protein 6.1 (L) 6.5 - 8.1 g/dL   Albumin 2.6 (L) 3.5 - 5.0 g/dL   AST 16 15 - 41 U/L   ALT 14 14 - 54 U/L   Alkaline Phosphatase 49 38 - 126 U/L   Total Bilirubin 0.8 0.3 - 1.2 mg/dL  Bilirubin, Direct 0.1 0.1 - 0.5 mg/dL   Indirect Bilirubin 0.7 0.3 - 0.9 mg/dL    Comment: Performed at [facility] [address] [place], [place] [address]  Urinalysis, Routine w reflex microscopic     Status: Abnormal   Collection Time: 12/29/17  1:11 PM  Result Value Ref Range   Color, Urine AMBER (A) YELLOW    Comment: BIOCHEMICALS MAY BE AFFECTED BY COLOR   APPearance CLOUDY (A) CLEAR   Specific Gravity, Urine 1.010 1.005 - 1.030   pH 8.0 5.0 - 8.0   Glucose, UA NEGATIVE NEGATIVE mg/dL   Hgb urine dipstick SMALL (A) NEGATIVE   Bilirubin Urine NEGATIVE NEGATIVE   Ketones, ur NEGATIVE NEGATIVE mg/dL   Protein, ur 100 (A) NEGATIVE mg/dL   Nitrite NEGATIVE NEGATIVE   Leukocytes, UA LARGE (A) NEGATIVE   RBC / HPF 0-5 0 - 5 RBC/hpf   WBC, UA 6-30 0 - 5 WBC/hpf   Bacteria, UA MANY (A) NONE SEEN   Squamous Epithelial / LPF 0-5 (A) NONE SEEN   Mucus PRESENT     Comment: Performed at [facility], [place] [address], [place], [place] [address]  Basic metabolic panel     Status: Abnormal   Collection Time: 12/29/17  1:25 PM  Result Value Ref Range   Sodium 135 135 - 145 mmol/L   Potassium 4.2 3.5 -  5.1 mmol/L   Chloride 106 101 - 111 mmol/L   CO2 14 (L) 22 - 32 mmol/L   Glucose, Bld 87 65 - 99 mg/dL   BUN 129 (H) 6 - 20 mg/dL   Creatinine, Ser 7.15 (H) 0.44 - 1.00 mg/dL   Calcium 8.3 (L) 8.9 - 10.3 mg/dL   GFR calc non Af Amer 5 (L) >60 mL/min   GFR calc Af Amer 6 (L) >60 mL/min    Comment: (NOTE) The eGFR has been calculated using the CKD EPI equation. This calculation has not been validated in all clinical situations. eGFR's persistently <60 mL/min signify possible Chronic Kidney Disease.    Anion gap 15 5 - 15    Comment: Performed at [facility] [address], [place], [place] [address]  I-Stat CG4 Lactic Acid, ED  (not at  [facility])     Status: Abnormal   Collection Time: 12/29/17  2:05 PM  Result Value Ref Range   Lactic Acid, Venous 0.47 (L) 0.5 - 1.9 mmol/L    Ct Head Wo Contrast  Result Date: 12/29/2017 CLINICAL DATA:  Slurred speech and generalized weakness for 4 days. EXAM: CT HEAD WITHOUT CONTRAST TECHNIQUE: Contiguous axial images were obtained from the base of the skull through the vertex without intravenous contrast. COMPARISON:  04/29/2017 and prior CTs FINDINGS: Brain: No evidence of acute infarction, hemorrhage, hydrocephalus, extra-axial collection or mass lesion/mass effect. Mild atrophy and remote left cerebellar and posterior right parietal infarcts again noted. Vascular: Atherosclerotic calcifications identified. Skull: Normal. Negative for fracture or focal lesion. Sinuses/Orbits: No acute abnormality Other: None IMPRESSION: 1. No evidence of acute intracranial abnormality 2. Remote left cerebellar and posterior right parietal infarcts. Electronically Signed   By: Margarette Canada M.D.   On: 12/29/2017 16:21   Dg Chest Port 1 View  Result Date: 12/29/2017 CLINICAL DATA:  Fever and weakness EXAM: PORTABLE CHEST 1 VIEW COMPARISON:  11/30/2017 FINDINGS: Cardiac shadow is stable. Aortic calcifications are noted. Lungs are well aerated bilaterally. Degenerative  changes of the thoracic spine are seen. IMPRESSION: No acute abnormality  noted. Electronically Signed   By: Inez Catalina M.D.   On: 12/29/2017 12:17    Review of Systems  Constitutional: Positive for chills and malaise/fatigue. Negative for fever and weight loss.  HENT: Negative.   Eyes: Negative.   Respiratory: Negative.   Cardiovascular: Negative.   Gastrointestinal: Positive for abdominal pain, diarrhea and nausea. Negative for blood in stool, constipation and vomiting.  Genitourinary: Negative for dysuria, flank pain and urgency.  Musculoskeletal: Negative.   Skin: Negative.   Neurological: Positive for dizziness and weakness. Negative for tingling, tremors and headaches.   Blood pressure (!) 97/48, pulse 79, temperature 98.1 F (36.7 C), temperature source Oral, resp. rate 20, SpO2 90 %. Physical Exam  Nursing note and vitals reviewed. Constitutional: She is oriented to person, place, and time. She appears well-developed and well-nourished. No distress.  HENT:  Head: Normocephalic and atraumatic.  Right Ear: External ear normal.  Mouth/Throat: Oropharynx is clear and moist.  Eyes: EOM are normal. Pupils are equal, round, and reactive to light. No scleral icterus.  Neck: Normal range of motion. No JVD present. No thyromegaly present.  Cardiovascular: Normal rate, regular rhythm and normal heart sounds.  No murmur heard. Respiratory: Effort normal and breath sounds normal. She has no wheezes. She has no rales.  GI: Soft. Bowel sounds are normal. There is tenderness. There is no rebound and no guarding.  Mild suprapubic tenderness  Musculoskeletal: Normal range of motion. She exhibits no edema or tenderness.  Neurological: She is alert and oriented to person, place, and time.  Skin: Skin is warm and dry.    Assessment/Plan: 1. Acute kidney injury on chronic kidney disease stage III: From the available data so far, more than likely from obstructive uropathy but could also have a  component of volume depletion given her underlying diuretic therapy and reported GI losses with diminished oral intake. Will give isotonic bicarbonate given her anion gap metabolic acidosis. 2. Anion gap metabolic acidosis: Secondary to acute kidney injury, will treat with isotonic sodium bicarbonate. 3. Obstructive uropathy: suspect bladder outlet obstruction has been alleviated by placement of Foley catheter yielding cloudy blood-tinged urine, given the appearance of her urine-high index of suspicion that indeed she might have a urinary tract infection, culture sent. 4. Hypotension: Secondary to residual effect of antihypertensive therapy/diuretic therapy and what appears to be volume depletion. Reassess with volume replacement. 5. Supratherapeutic INR: Hold Coumadin at this time especially with hematuria. 6. Anemia: Monitor hemoglobin/hematocrit trend   Patricia Wagner K. 12/29/2017, 4:27 PM

## 2017-12-29 NOTE — ED Notes (Signed)
Pt returned from CT.  Water given to pt

## 2017-12-29 NOTE — ED Triage Notes (Signed)
Onset 4 days ago slurred speech, generalized weakness, and diarrhea x 3, watery, brown in past 24 hours.  Pt was seen at Dr. Lonia Skinner office yesterday and pt was advised to go to ED.  Hand grips equal, smile symmetrical, normal sensation.  Pts daughter reports no slurred speech today.

## 2017-12-29 NOTE — Telephone Encounter (Signed)
RESEARCH ENCOUNTER  Patient ID: Patricia Wagner  DOB: 1951-11-17  Spoke with Princess Perna on the phone about her ED visit yesterday, February 5th.  Subject stated that the ED was "too crowded yesterday" and she is planning on coming to the ED today.  States she is feeling "okay" and will arrive in the ED shortly.  Will continue to follow.

## 2017-12-29 NOTE — H&P (Signed)
History and Physical    Patricia Wagner KJZ:[id number] DOB: Aug 15, 1951 DOA: 12/29/2017  PCP: Biagio Borg, MD  Urology: Dr Pilar Jarvis, MD Consultants:  Nephrology  Patient coming from: Home lives alone; [facility]: daughter [name]  Chief Complaint: weakness, slurred speech for several days  HPI: Patricia Wagner is a 67 y.o. female with medical history significant of persistent afib on Coumadin, multiple failed DCCV's, chronic systolic HF/NICM, CAD with no obstructive dz per cath 11/17, htn, hld, CVA, stage III CKD, COPD, diverticulosis,Raynaud syndrome, CKD (baseline ~1.4). Pt had recent admission 1/7 through 12/08/17 for acute renal failure secondary to unexplained obstructive uropathy. Pt presents today with complaints of weakness and slurred speech. She was sent to the ED from cardiology office yesterday for same symptoms, but left without being seen as wait was too long. Returned today for continued weakness and slurred speech. Pt denies recent fever, chest pain or shortness of breath. States she did follow up with urology on 1/26 and foley was d/c'd. She was instructed to self cath q8h, but stopped over the weekend bc she felt she was voiding fine on her own. She was due to urodynamic study in office today. Pt also reports loose stool x 5 days. Abdominal pain has resolved with placement of foley cath. Denies any n/v, melena or hematochezia.    ED Course: On arrival to ED BP 80/45 responding well to NS bolus. Creatinine 7.5, BUN 129, bicarb 14. Lactic acid 0.47. Bladder noted to be distended. Urinalysis cloudy, large leukocytes, many bacteria, 6-30 WBCs. Foley placed and 1700cc output. CXR is unremarkable. No head CT was obtained. INR 8.61  Review of Systems: As per HPI; otherwise review of systems reviewed and negative.   Ambulatory Status: ambulates with cane  Past Medical History:  Diagnosis Date  . Arthritis   . Atrial fibrillation ([place])    a. s/p multiple cardioversions; failed tikosyn/sotalol.  Marland Kitchen  CAD in native artery, s/p cardiac cath with non obstructive CAD 10/24/2016  . CHF (congestive heart failure) ([place])   . DIVERTICULITIS, HX OF 07/25/2007  . DIVERTICULOSIS, COLON 07/22/2007  . Dyspnea   . Edema, peripheral    a. chronic BLE edema, R>L. Prior trauma from dog attack and accident.  Marland Kitchen HYPERLIPIDEMIA 02/03/2008  . Hypersomnia    declines w/u  . Hypertension   . MENOPAUSAL DISORDER 01/09/2011  . Morbid obesity ([place]) 07/22/2007  . NICM (nonischemic cardiomyopathy) ([place]) 10/24/2016  . Raynaud's syndrome 07/22/2007  . Stroke ([place]) 2017  . THYROID NODULE, RIGHT 01/04/2010  . VITAMIN D DEFICIENCY 01/09/2011   Qualifier: Diagnosis of  By: [name], [name]     Past Surgical History:  Procedure Laterality Date  . CARDIAC CATHETERIZATION N/A 10/23/2016   Procedure: Left Heart Cath and Coronary Angiography;  Surgeon: [name], MD;  Location: [place] CV LAB;  Service: Cardiovascular;  Laterality: N/A;  . CARDIOVERSION N/A 10/31/2016   Procedure: CARDIOVERSION;  Surgeon: [name], MD;  Location: [facility] ENDOSCOPY;  Service: Cardiovascular;  Laterality: N/A;  . CARDIOVERSION N/A 11/03/2016   Procedure: CARDIOVERSION;  Surgeon: [name], MD;  Location: [facility] ENDOSCOPY;  Service: Cardiovascular;  Laterality: N/A;  . CARDIOVERSION N/A 11/18/2016   Procedure: CARDIOVERSION;  Surgeon: [name], MD;  Location: [facility] ENDOSCOPY;  Service: Cardiovascular;  Laterality: N/A;  . CARDIOVERSION N/A 03/25/2017   Procedure: CARDIOVERSION;  Surgeon: [name], MD;  Location: [facility] ENDOSCOPY;  Service: Cardiovascular;  Laterality: N/A;  . CARDIOVERSION N/A 05/06/2017   Procedure:  CARDIOVERSION;  Surgeon: [name], MD;  Location: [facility] ENDOSCOPY;  Service: Cardiovascular;  Laterality: N/A;  . CARDIOVERSION N/A 05/12/2017   Procedure: CARDIOVERSION;  Surgeon: [name], MD;  Location: [facility] ENDOSCOPY;  Service: Cardiovascular;  Laterality: N/A;  . COLONOSCOPY N/A 12/04/2017    Procedure: COLONOSCOPY;  Surgeon: [name], MD;  Location: [facility] ENDOSCOPY;  Service: Endoscopy;  Laterality: N/A;  . COLONOSCOPY W/ POLYPECTOMY  02/2011   pan diverticulosis.  tubular adenoma without dysplasia on 5 mm sigmoid polyp.  Dr Fuller Plan.    Marland Kitchen PARTIAL HYSTERECTOMY     1 OVARY LEFT  . TEE WITHOUT CARDIOVERSION N/A 10/31/2016   Procedure: TRANSESOPHAGEAL ECHOCARDIOGRAM (TEE);  Surgeon: [name], MD;  Location: [facility] ENDOSCOPY;  Service: Cardiovascular;  Laterality: N/A;  . TEE WITHOUT CARDIOVERSION N/A 03/25/2017   Procedure: TRANSESOPHAGEAL ECHOCARDIOGRAM (TEE);  Surgeon: [name], MD;  Location: [facility] ENDOSCOPY;  Service: Cardiovascular;  Laterality: N/A;    Social History   Socioeconomic History  . Marital status: Single    Spouse name: Not on file  . Number of children: 1  . Years of education: Not on file  . Highest education level: Not on file  Social Needs  . Financial resource strain: Not on file  . Food insecurity - worry: Not on file  . Food insecurity - inability: Not on file  . Transportation needs - medical: Not on file  . Transportation needs - non-medical: Not on file  Occupational History  . Occupation: Academic librarian   Tobacco Use  . Smoking status: Current Every Day Smoker    Packs/day: 0.50    Types: Cigarettes    Last attempt to quit: 10/23/2016    Years since quitting: 1.1  . Smokeless tobacco: Never Used  . Tobacco comment: Smoked for 50 years  Substance and Sexual Activity  . Alcohol use: No    Alcohol/week: 2.4 oz    Types: 4 Cans of beer per week    Frequency: Never    Comment: Intermittent  . Drug use: No  . Sexual activity: Not on file  Other Topics Concern  . Not on file  Social History Narrative  . Not on file    Allergies  Allergen Reactions  . Ace Inhibitors Palpitations    Family History  Problem Relation Age of Onset  . Asthma Mother   . Diabetes Father   . Heart disease Father        Died of presumed heart attack - 67s    . Lung disease Sister   . Heart disease Sister        Twin sister has heart issue, unclear what kind  . Thyroid disease Neg Hx     Prior to Admission medications   Medication Sig Start Date End Date Taking? Authorizing Provider  acetaminophen (TYLENOL) 325 MG tablet Take 650 mg by mouth every 6 (six) hours as needed (pain).     [provider]  amiodarone (PACERONE) 200 MG tablet TAKE 1 TABLET BY MOUTH EVERY DAY 12/04/17   Bensimhon, Shaune Pascal, MD  carvedilol (COREG) 3.125 MG tablet Take 1 tablet (3.125 mg total) by mouth 2 (two) times daily. 07/09/17 12/28/17  [name], MD  diclofenac sodium (VOLTAREN) 1 % GEL Apply 4 g topically 4 (four) times daily as needed (As directed for skin). Patient taking differently: Apply 4 g topically 4 (four) times daily as needed (for pain).  10/23/17   Strader, Fransisco Hertz, PA-C  furosemide (LASIX)  20 MG tablet Take 1 tablet (20 mg total) by mouth daily. 10/27/17   Erma Heritage, PA-C  Investigational - Study Medication Take 1 tablet by mouth 2 (two) times daily. Study name: Galactic HF Study  Additional study details: Omecamtiv Mecarbil or Placebo 09/17/17   Larey Dresser, MD  isosorbide-hydrALAZINE (BIDIL) 20-37.5 MG tablet Take 1 tablet by mouth 3 (three) times daily. Patient taking differently: Take 1 tablet by mouth 2 (two) times daily.  10/20/17   [name], MD  ranolazine (RANEXA) 500 MG 12 hr tablet Take 1 tablet (500 mg total) by mouth 2 (two) times daily. 09/14/17   Bensimhon, Shaune Pascal, MD  rosuvastatin (CRESTOR) 10 MG tablet TAKE 1 TABLET (10 MG TOTAL) BY MOUTH DAILY. 10/07/17   Biagio Borg, MD  sacubitril-valsartan (ENTRESTO) 49-51 MG Take 1 tablet by mouth 2 (two) times daily. 08/19/17   Bensimhon, Shaune Pascal, MD  spironolactone (ALDACTONE) 25 MG tablet Take 1 tablet (25 mg total) by mouth daily. 12/09/17   Nita Sells, MD  tamsulosin (FLOMAX) 0.4 MG CAPS capsule Take 1 capsule (0.4 mg total) by mouth daily.  11/23/17   Biagio Borg, MD  warfarin (COUMADIN) 3 MG tablet Take 3 mg by mouth daily.    [provider]    Physical Exam: Vitals:   12/29/17 1500 12/29/17 1530 12/29/17 1545 12/29/17 1600  BP: (!) 88/50 (!) 92/50 (!) 90/53 (!) 97/48  Pulse: 73 77 79 79  Resp: 18 18 (!) 21 20  Temp:      TempSrc:      SpO2: 93% 93% 93% 90%     General: awake, alert lying supine in bed in NAD. Eyes: PERRL, EOMI, normal lids, iris ENT: grossly normal hearing, lips & tongue, mmm; appropriate dentition Neck: no LAD, masses or thyromegaly; no carotid bruits Cardiovascular: RRR, no m/r/g. No LE edema.  Respiratory:  CTA bilaterally with no wheezes/rales/rhonchi.  Normal respiratory effort. Abdomen: soft, no rebound tenderness, ND, NABS Skin: no rash or induration seen on limited exam Musculoskeletal: grossly normal tone BUE/BLE, good ROM, no bony abnormality Lower extremity: No LE edema.  Limited foot exam with no ulcerations.  2+ distal pulses. Psychiatric: rossly normal mood and affect, speech fluent and appropriate, AOx3 Neurologic: slurred speech otherwise CN 2-12 grossly intact, moves all extremities in coordinated fashion, sensation intact    Radiological Exams on Admission: Ct Head Wo Contrast  Result Date: 12/29/2017 CLINICAL DATA:  Slurred speech and generalized weakness for 4 days. EXAM: CT HEAD WITHOUT CONTRAST TECHNIQUE: Contiguous axial images were obtained from the base of the skull through the vertex without intravenous contrast. COMPARISON:  04/29/2017 and prior CTs FINDINGS: Brain: No evidence of acute infarction, hemorrhage, hydrocephalus, extra-axial collection or mass lesion/mass effect. Mild atrophy and remote left cerebellar and posterior right parietal infarcts again noted. Vascular: Atherosclerotic calcifications identified. Skull: Normal. Negative for fracture or focal lesion. Sinuses/Orbits: No acute abnormality Other: None IMPRESSION: 1. No evidence of acute  intracranial abnormality 2. Remote left cerebellar and posterior right parietal infarcts. Electronically Signed   By: Margarette Canada M.D.   On: 12/29/2017 16:21   Dg Chest Port 1 View  Result Date: 12/29/2017 CLINICAL DATA:  Fever and weakness EXAM: PORTABLE CHEST 1 VIEW COMPARISON:  11/30/2017 FINDINGS: Cardiac shadow is stable. Aortic calcifications are noted. Lungs are well aerated bilaterally. Degenerative changes of the thoracic spine are seen. IMPRESSION: No acute abnormality noted. Electronically Signed   By: Linus Mako.D.  On: 12/29/2017 12:17    EKG: Independently reviewed.  NSR with rate 70bpm; nonspecific ST changes with no evidence of acute ischemia   Labs on Admission: I have personally reviewed the available labs and imaging studies at the time of the admission.  Pertinent labs:  WBC 15.6 (no diff) hbg 10.9 BUN/Cr  129/7.15 Lactic acid 0.47 Trop poc 0.04 INR 8.61  Assessment/Plan Principal Problem:   Acute renal failure superimposed on chronic kidney disease (HCC) Active Problems:   Essential hypertension   Paroxysmal atrial fibrillation (HCC)   Urinary retention   Anemia   Metabolic alkalosis   Supratherapeutic INR   UTI (urinary tract infection)    Acute renal failure in setting of CKD, stage III/metabolic acidosis -recent admit for same felt secondary to obstructive uropathy (baseline Cr ~1.4) -pt also on diuretics a/w diarrhea and appears volume depleted. -will hold renal toxic meds for now, hydrate gently -sodium bicarb gtt per renal -am labs  Obstructive uropathy -recent admit for same. Pt followed-up with urology and instructed to self-cath. She stopped doing this as she felt she was voiding fine on her own -I communicated with Dr Pilar Jarvis. Pt needs foley left in and f/u for outpt urodynamic study which she was actually scheduled to have today -consider renal u/s if no improvement in renal fx  Slurred speech -No acute finding on head CT. Old left  cerebellar and right parietal infarct. Suspect r/t to acute issues. Monitor  UTI -vs chronic colonization. Will tx with empiric Rocephin. Check urine cultures.   Hypotension -has responded well to IVFs. Likely secondary to home antihypertensives and diuretics.  -She does not appear septic with normal lactate, afebrile, but low threshold to broaden antibiotic coverage. Vanco/Zosyn given in ED -IVF hydration. Hold home meds for hypertension for now  Diarrhea -will check cdiff given recent hospitalization/antibiotics  Supratherapeutic INR - on Coumadin, will ask pharm to follow -vit K given in ED as pt with hematuria  Anemia, mild -monitor with above  Chronic systolic HF -actually volume depleted on exam. Continue holding Lasix, spironolactone and Entresto  Paroxysmal afib -on chronic Coumadin. Sinus rhythm now -She is scheduled for ablation when stable with Dr. Curt Bears -continue amiodarone    DVT prophylaxis: supratherapeutic INR Code Status: full Family Communication: none available on admit Disposition Plan: Home once clinically improved Consults called: nephrology. Spoke with urology, but no formal consult Admission status: inpt   Patrici Ranks, NP-C [place]  pgr [phone]   If note is complete, please contact covering daytime or nighttime physician. [email or web address] Password [email or web address]  12/29/2017, 5:22 PM

## 2017-12-29 NOTE — Progress Notes (Signed)
ANTICOAGULATION CONSULT NOTE - Initial Consult  Pharmacy Consult for Coumadin Indication: atrial fibrillation  Allergies  Allergen Reactions  . Ace Inhibitors Palpitations   Assessment: 67 yo F presents on 2/5 with weakness and slurred speech for several days. INR 8.61 on admit. Has hematuria in ED. Vitamin K given. Hgb 10.9, plts wnl.  Goal of Therapy:  INR 2-3 Monitor platelets by anticoagulation protocol: Yes   Plan:  Hold Coumadin Monitor daily INR, CBC, s/s of bleed  Elenor Quinones, PharmD, BCPS Clinical Pharmacist Pager [phone] 12/29/2017 6:01 PM

## 2017-12-30 ENCOUNTER — Other Ambulatory Visit: Payer: Self-pay

## 2017-12-30 DIAGNOSIS — E872 Acidosis: Secondary | ICD-10-CM | POA: Diagnosis present

## 2017-12-30 DIAGNOSIS — E8729 Other acidosis: Secondary | ICD-10-CM | POA: Diagnosis present

## 2017-12-30 DIAGNOSIS — R7881 Bacteremia: Secondary | ICD-10-CM | POA: Clinically undetermined

## 2017-12-30 LAB — BLOOD CULTURE ID PANEL (REFLEXED)
ACINETOBACTER BAUMANNII: NOT DETECTED
CANDIDA ALBICANS: NOT DETECTED
CANDIDA GLABRATA: NOT DETECTED
CANDIDA TROPICALIS: NOT DETECTED
Candida krusei: NOT DETECTED
Candida parapsilosis: NOT DETECTED
Carbapenem resistance: NOT DETECTED
ENTEROBACTER CLOACAE COMPLEX: DETECTED — AB
ESCHERICHIA COLI: NOT DETECTED
Enterobacteriaceae species: DETECTED — AB
Enterococcus species: NOT DETECTED
Haemophilus influenzae: NOT DETECTED
KLEBSIELLA PNEUMONIAE: NOT DETECTED
Klebsiella oxytoca: NOT DETECTED
Listeria monocytogenes: NOT DETECTED
NEISSERIA MENINGITIDIS: NOT DETECTED
PSEUDOMONAS AERUGINOSA: NOT DETECTED
Proteus species: NOT DETECTED
STREPTOCOCCUS AGALACTIAE: NOT DETECTED
STREPTOCOCCUS PYOGENES: NOT DETECTED
Serratia marcescens: NOT DETECTED
Staphylococcus aureus (BCID): NOT DETECTED
Staphylococcus species: NOT DETECTED
Streptococcus pneumoniae: NOT DETECTED
Streptococcus species: NOT DETECTED

## 2017-12-30 LAB — RENAL FUNCTION PANEL
ANION GAP: 16 — AB (ref 5–15)
Albumin: 2.3 g/dL — ABNORMAL LOW (ref 3.5–5.0)
BUN: 120 mg/dL — ABNORMAL HIGH (ref 6–20)
CHLORIDE: 110 mmol/L (ref 101–111)
CO2: 13 mmol/L — AB (ref 22–32)
Calcium: 8.2 mg/dL — ABNORMAL LOW (ref 8.9–10.3)
Creatinine, Ser: 5.71 mg/dL — ABNORMAL HIGH (ref 0.44–1.00)
GFR calc non Af Amer: 7 mL/min — ABNORMAL LOW (ref >60)
GFR, EST AFRICAN AMERICAN: 8 mL/min — AB (ref >60)
Glucose, Bld: 69 mg/dL (ref 65–99)
Phosphorus: 5 mg/dL — ABNORMAL HIGH (ref 2.5–4.6)
Potassium: 4 mmol/L (ref 3.5–5.1)
Sodium: 139 mmol/L (ref 135–145)

## 2017-12-30 LAB — CBC
HEMATOCRIT: 31.1 % — AB (ref 36.0–46.0)
Hemoglobin: 9.8 g/dL — ABNORMAL LOW (ref 12.0–15.0)
MCH: 30.1 pg (ref 26.0–34.0)
MCHC: 31.5 g/dL (ref 30.0–36.0)
MCV: 95.4 fL (ref 78.0–100.0)
PLATELETS: 147 10*3/uL — AB (ref 150–400)
RBC: 3.26 MIL/uL — AB (ref 3.87–5.11)
RDW: 15.6 % — ABNORMAL HIGH (ref 11.5–15.5)
WBC: 13.4 10*3/uL — AB (ref 4.0–10.5)

## 2017-12-30 LAB — PROTIME-INR
INR: 1.38
PROTHROMBIN TIME: 16.9 s — AB (ref 11.4–15.2)

## 2017-12-30 MED ORDER — ONDANSETRON HCL 4 MG/2ML IJ SOLN: 4.0000 mg | Freq: Four times a day (QID) | INTRAMUSCULAR | Status: DC | PRN

## 2017-12-30 MED ORDER — HEPARIN (PORCINE) IN NACL 100-0.45 UNIT/ML-% IJ SOLN
1900.0000 [IU]/h | INTRAMUSCULAR | Status: DC
  Administered 2017-12-30: 1050 [IU]/h via INTRAVENOUS
  Administered 2018-01-01: 1850 [IU]/h via INTRAVENOUS
  Administered 2018-01-02 – 2018-01-03 (×2): 2100 [IU]/h via INTRAVENOUS
  Administered 2018-01-03: 2300 [IU]/h via INTRAVENOUS
  Administered 2018-01-04 (×2): 2100 [IU]/h via INTRAVENOUS
  Administered 2018-01-05: 2000 [IU]/h via INTRAVENOUS
  Administered 2018-01-05 (×2): 2100 [IU]/h via INTRAVENOUS
  Administered 2018-01-06 – 2018-01-07 (×3): 1900 [IU]/h via INTRAVENOUS
  Filled 2017-12-30 (×16): qty 250

## 2017-12-30 MED ORDER — ACETAMINOPHEN 325 MG PO TABS
650.0000 mg | ORAL_TABLET | Freq: Four times a day (QID) | ORAL | Status: DC | PRN
Start: 2017-12-30 — End: 2018-01-08

## 2017-12-30 MED ORDER — ONDANSETRON HCL 4 MG PO TABS: 4.0000 mg | ORAL_TABLET | Freq: Four times a day (QID) | ORAL | Status: DC | PRN

## 2017-12-30 MED ORDER — ACETAMINOPHEN 650 MG RE SUPP: 650.0000 mg | Freq: Four times a day (QID) | RECTAL | Status: DC | PRN

## 2017-12-30 MED ORDER — STERILE WATER FOR INJECTION IV SOLN
150.0000 meq | INTRAVENOUS | Status: DC
  Administered 2017-12-30 – 2017-12-31 (×2): 150 meq via INTRAVENOUS
  Filled 2017-12-30 (×5): qty 850

## 2017-12-30 MED ORDER — SODIUM CHLORIDE 0.9 % IV SOLN
INTRAVENOUS | Status: DC
  Administered 2017-12-30: 09:00:00 via INTRAVENOUS

## 2017-12-30 MED ORDER — AMIODARONE HCL 200 MG PO TABS
200.0000 mg | ORAL_TABLET | Freq: Every day | ORAL | Status: DC
  Administered 2017-12-30 – 2018-01-08 (×9): 200 mg via ORAL
  Filled 2017-12-30 (×10): qty 1

## 2017-12-30 MED ORDER — DEXTROSE 5 % IV SOLN
1.0000 g | Freq: Every day | INTRAVENOUS | Status: DC
  Administered 2017-12-30 – 2018-01-01 (×3): 1 g via INTRAVENOUS
  Filled 2017-12-30 (×3): qty 1

## 2017-12-30 MED ORDER — DEXTROSE 5 % IV SOLN: 1.0000 g | INTRAVENOUS | Status: DC

## 2017-12-30 NOTE — Progress Notes (Signed)
ANTICOAGULATION CONSULT NOTE - Initial Consult  Pharmacy Consult for heparin Indication: atrial fibrillation  Allergies  Allergen Reactions  . Ace Inhibitors Palpitations    Patient Measurements: Height: 5\' 5"  (165.1 cm) Weight: 246 lb 14.6 oz (112 kg) IBW/kg (Calculated) : 57 Heparin Dosing Weight: 83.5 kg  Vital Signs: Temp: 98.6 F (37 C) (02/06 1527) Temp Source: Oral (02/06 1527) BP: 100/53 (02/06 1527) Pulse Rate: 73 (02/06 1527)  Labs: Recent Labs    12/28/17 1446 12/29/17 1126 12/29/17 1325 12/29/17 2231 12/30/17 0542 12/30/17 1205  HGB  --  10.9*  --   --  9.8*  --   HCT  --  33.4*  --   --  31.1*  --   PLT  --  194  --   --  147*  --   LABPROT  --  70.4*  --   --   --  16.9*  INR >8 8.61*  --   --   --  1.38  CREATININE  --   --  7.15* 6.25* 5.71*  --     Estimated Creatinine Clearance: 12.4 mL/min (A) (by C-G formula based on SCr of 5.71 mg/dL (H)).   Medical History: Past Medical History:  Diagnosis Date  . Arthritis   . Atrial fibrillation ([place])    a. s/p multiple cardioversions; failed tikosyn/sotalol.  Marland Kitchen CAD in native artery, s/p cardiac cath with non obstructive CAD 10/24/2016  . CHF (congestive heart failure) ([place])   . DIVERTICULITIS, HX OF 07/25/2007  . DIVERTICULOSIS, COLON 07/22/2007  . Dyspnea   . Edema, peripheral    a. chronic BLE edema, R>L. Prior trauma from dog attack and accident.  Marland Kitchen HYPERLIPIDEMIA 02/03/2008  . Hypersomnia    declines w/u  . Hypertension   . MENOPAUSAL DISORDER 01/09/2011  . Morbid obesity ([place]) 07/22/2007  . NICM (nonischemic cardiomyopathy) ([place]) 10/24/2016  . Raynaud's syndrome 07/22/2007  . Stroke ([place]) 2017  . THYROID NODULE, RIGHT 01/04/2010  . VITAMIN D DEFICIENCY 01/09/2011   Qualifier: Diagnosis of  By: [name], [name]     Medications:  Scheduled:  . amiodarone  200 mg Oral Daily    Assessment: 67 yo female on chronic Coumadin for afib, held on admission for supratherapeutic INR.  INR now < 2  after vitamin K given yesterday.  Pharmacy asked to start anticoagulation with IV heparin.  Currently with some clots in foley but no other overt signs of bleeding.  Goal of Therapy:  Heparin level 0.3-0.7 units/ml Monitor platelets by anticoagulation protocol: Yes   Plan:  1. Start IV heparin at 1050 units/hr. 2. Check heparin level in 8 hrs. 3. Daily heparin level and CBC. 4. F/u ability to restart Coumadin.  Uvaldo Rising, BCPS  Clinical Pharmacist Pager [phone]  12/30/2017 8:38 PM

## 2017-12-30 NOTE — Progress Notes (Signed)
Patient ID: Patricia Wagner, female   DOB: 02-22-1951, 67 y.o.   MRN: [id number]  [facility] Progress Note   Assessment/ Plan:   1. Acute kidney injury on chronic kidney disease stage III: Most likely from obstructive uropathy but could also have a component of volume depletion given her underlying diuretic therapy and reported GI losses with diminished oral intake. Some improvement of renal function noted overnight with fair urine output-continue isotonic sodium bicarbonate infusion at this time for volume replacement/metabolic acidosis. 2. Anion gap metabolic acidosis: Secondary to acute kidney injury, continue isotonic sodium bicarbonate at this time, not unlikely that she might have a distal RTA compounding metabolic acidosis. 3. Obstructive uropathy: suspect bladder outlet obstruction has been alleviated by placement of Foley catheter yielding cloudy blood-tinged urine, appears to be having an associated urinary tract infection ?self-catheterization technique. 4. Hypotension: continue intravenous fluids at this time with close evaluation for possible CHF exacerbation. 5. Supratherapeutic INR: Hold Coumadin at this time especially with hematuria. 6. Anemia: Monitor hemoglobin/hematocrit trend  Subjective:   Reports to be feeling thirsty this morning-denies any chest pain or shortness of breath.    Objective:   BP (!) 97/44 (BP Location: Left Arm)   Pulse 81   Temp 98.9 F (37.2 C) (Oral)   Resp 18   Ht 5\' 5"  (1.651 m)   Wt 112 kg (246 lb 14.6 oz)   SpO2 96%   BMI 41.09 kg/m   Intake/Output Summary (Last 24 hours) at 12/30/2017 1047 Last data filed at 12/30/2017 0602 Gross per 24 hour  Intake 50 ml  Output 3275 ml  Net -3225 ml   Weight change:   Physical Exam: Gen: Comfortable resting in bed CVS: Pulse irregularly irregular, normal rate, S1 and S2 normal Resp: Poor inspiratory effort with decreased breath sounds over bases, no rales/rhonchi Abd: Soft, flat, and mild  tenderness over suprapubic area persists Ext: No edema over ankles  Imaging: Ct Head Wo Contrast  Result Date: 12/29/2017 CLINICAL DATA:  Slurred speech and generalized weakness for 4 days. EXAM: CT HEAD WITHOUT CONTRAST TECHNIQUE: Contiguous axial images were obtained from the base of the skull through the vertex without intravenous contrast. COMPARISON:  04/29/2017 and prior CTs FINDINGS: Brain: No evidence of acute infarction, hemorrhage, hydrocephalus, extra-axial collection or mass lesion/mass effect. Mild atrophy and remote left cerebellar and posterior right parietal infarcts again noted. Vascular: Atherosclerotic calcifications identified. Skull: Normal. Negative for fracture or focal lesion. Sinuses/Orbits: No acute abnormality Other: None IMPRESSION: 1. No evidence of acute intracranial abnormality 2. Remote left cerebellar and posterior right parietal infarcts. Electronically Signed   By: Margarette Canada M.D.   On: 12/29/2017 16:21   Dg Chest Port 1 View  Result Date: 12/29/2017 CLINICAL DATA:  Fever and weakness EXAM: PORTABLE CHEST 1 VIEW COMPARISON:  11/30/2017 FINDINGS: Cardiac shadow is stable. Aortic calcifications are noted. Lungs are well aerated bilaterally. Degenerative changes of the thoracic spine are seen. IMPRESSION: No acute abnormality noted. Electronically Signed   By: Inez Catalina M.D.   On: 12/29/2017 12:17    Labs: BMET Recent Labs  Lab 12/29/17 1325 12/29/17 2231 12/30/17 0542  NA 135 137 139  K 4.2 3.9 4.0  CL 106 108 110  CO2 14* 13* 13*  GLUCOSE 87 75 69  BUN 129* 122* 120*  CREATININE 7.15* 6.25* 5.71*  CALCIUM 8.3* 8.2* 8.2*  PHOS  --  4.5 5.0*   CBC Recent Labs  Lab 12/29/17 1126 12/30/17 0542  WBC  15.6* 13.4*  HGB 10.9* 9.8*  HCT 33.4* 31.1*  MCV 93.3 95.4  PLT 194 147*    Medications:    . amiodarone  200 mg Oral Daily   Elmarie Shiley, MD 12/30/2017, 10:47 AM

## 2017-12-30 NOTE — Care Management Note (Addendum)
Case Management Note  Patient Details  Name: Patricia Wagner MRN: [id number] Date of Birth: 04-May-1951  Subjective/Objective:  Admitted for Empiric iv abx given re hypotension, possible sepsis. Dx AKI;Hx significant for congestive heart failure, atrial fibrillation on anticoagulation with Coumadin.    Action/Plan: Nephrology consulted: Acute kidney injury on chronic kidney disease stage III. found to have a creatinine of 7.15/BUN 129  Will give isotonic bicarbonate given her anion gap metabolic acidosis. suspect bladder outlet obstruction has been alleviated by placement of Foley catheter yielding cloudy blood-tinged urine. Urine Cx sent.Hold Coumadin at this time especially with hematuria.  Expected Discharge Date:  01/02/18               Expected Discharge Plan:  Home/Self Care  Discharge planning Services  CM Consult  Status of Service:  In process, will continue to follow  Additional Comments:  In to speak with patient. Prior to admission patient lived at home alone.  [facility] on [address] in [place].  PCP Cathlean Cower. Home DME: [employer]. States she weighs herself daily. Does not cook with salt. Never used [place] before does not have a preference but concerned of co-pay If [place] is required post discharge.  Patient cooks for herself and drives to medical appointments. Denies inability to afford food or medications. Denies need of transportation time of discharge. NCM will continue to follow for discharge needs.  [name], RN  Nurse case manager [phone] 12/30/2017, 10:08 AM

## 2017-12-30 NOTE — Progress Notes (Addendum)
PHARMACY - PHYSICIAN COMMUNICATION CRITICAL VALUE ALERT - BLOOD CULTURE IDENTIFICATION (BCID)  Patricia Wagner is an 67 y.o. female who presented to [facility] on 12/29/2017   Assessment:  1/2 blood cultures with enterobacter, ?urosepsis  Name of physician (or Provider) Contacted: N/A patient not assigned to provider yet  Current antibiotics: vancomycin, zosyn  Changes to prescribed antibiotics recommended:  Patient is on recommended antibiotics - No changes needed   Addendum  -Vancomycin/zosyn was discontinued, ceftriaxone was added instead -Given unknown resistance pattern, change to cefepime 1 g IV q24h  Patricia Wagner  12/30/2017 6:15 AM     Results for orders placed or performed during the hospital encounter of 12/29/17  Blood Culture ID Panel (Reflexed) (Collected: 12/29/2017  1:20 PM)  Result Value Ref Range   Enterococcus species NOT DETECTED NOT DETECTED   Listeria monocytogenes NOT DETECTED NOT DETECTED   Staphylococcus species NOT DETECTED NOT DETECTED   Staphylococcus aureus NOT DETECTED NOT DETECTED   Streptococcus species NOT DETECTED NOT DETECTED   Streptococcus agalactiae NOT DETECTED NOT DETECTED   Streptococcus pneumoniae NOT DETECTED NOT DETECTED   Streptococcus pyogenes NOT DETECTED NOT DETECTED   Acinetobacter baumannii NOT DETECTED NOT DETECTED   Enterobacteriaceae species DETECTED (A) NOT DETECTED   Enterobacter cloacae complex DETECTED (A) NOT DETECTED   Escherichia coli NOT DETECTED NOT DETECTED   Klebsiella oxytoca NOT DETECTED NOT DETECTED   Klebsiella pneumoniae NOT DETECTED NOT DETECTED   Proteus species NOT DETECTED NOT DETECTED   Serratia marcescens NOT DETECTED NOT DETECTED   Carbapenem resistance NOT DETECTED NOT DETECTED   Haemophilus influenzae NOT DETECTED NOT DETECTED   Neisseria meningitidis NOT DETECTED NOT DETECTED   Pseudomonas aeruginosa NOT DETECTED NOT DETECTED   Candida albicans NOT DETECTED NOT DETECTED   Candida glabrata NOT  DETECTED NOT DETECTED   Candida krusei NOT DETECTED NOT DETECTED   Candida parapsilosis NOT DETECTED NOT DETECTED   Candida tropicalis NOT DETECTED NOT DETECTED    Patricia Wagner 12/30/2017  6:07 AM

## 2017-12-30 NOTE — Progress Notes (Signed)
PROGRESS NOTE  Patricia Wagner FBP:[id number] DOB: [birth year] DOA: 12/29/2017 PCP: Biagio Borg, MD  HPI/Recap of past 24 hours:  Patricia Wagner is a 67 y.o. year old female with medical history significant for atrial fibrillation on Coumadin, chronic systolic congestive heart failure, CAD, hypertension, hyperlipidemia, CKD stage III, previous CVA, COPD, reknot syndrome who presented on 12/29/2017 with weakness and slurred speech and was found to have AKI on CKD with anion gap metabolic acidosis concerning for obstructive uropathy, gram-negative bacteremia and supratherapeutic INR.   Subjective This morning complains of being thirsty, and hungry.  Otherwise denies any abdominal pain, previous dysuria.  Assessment/Plan: Principal Problem:   Acute renal failure superimposed on chronic kidney disease ([place]) Active Problems:   Essential hypertension   Paroxysmal atrial fibrillation (HCC)   Urinary retention   Anemia   Supratherapeutic INR   Acute UTI   Bacteremia due to Gram-negative bacteria   Increased anion gap metabolic acidosis  Gram-negative bacteremia Most likely secondary to suspected UTI, unclear organism currently.  Transition to cefepime in light of blood culture results.  We will follow-up repeat blood cultures on 2/6 to ensure clearance.  Follow-up urine culture and blood cultures activities.  Patient remains hemodynamic is stable  AKI on CKD stage III with anion gap metabolic acidosis, improving Suspect most likely related to post renal since patient's creatinine improved with administration of Foley catheter upon admission.  Some concern for potential prerenal etiology given reports of decreased oral intake and possible emesis peak creatinine of 7.15 on admission, previous baseline of 1.2-1.4.  Continue fluid resuscitation with isotonic sodium bicarbonate infusion obstructive uropathy  Obstructive uropathy, unclear etiology Relatively recent problem diagnosed during recent  admission (12/08/17) imaging at that time of ( CT abdomen, renal ultrasound) were unrevealing.  Patient was started on Flomax at previous hospitalization with plans to follow-up with urology as outpatient.  Empirically being treated for UTI given leukocytes on UA, follow-up urine culture  Chronic atrial fibrillation, rate controlled complicated by supratherapeutic INR, resolved Held home Coumadin in setting of supratherapeutic INR on admission (8.61), status post vitamin K now 1.38.  Continue home amiodarone.  Given prior history of stroke will start heparin given subtherapeutic INR, will continue to monitor for any signs of bleeding (some clots in foley, will continue to monitor)  Anemia of chronic disease, stable Baseline hemoglobin 10.7, has been relatively stable at baseline today's hemoglobin 9.8.  Continue to monitor on CBC  Chronic systolic heart failure, nonischemic cardiomyopathy, hypovolemic on exam Holding home diuretics in the setting of hypotension and AK I  Code Status: Full code  Family Communication: No family at bedside  Disposition Plan: Needs to have therapeutic INR, continue improving creatinine   Consultants:  Nephrology  Procedures:  None  Antimicrobials:  Vancomycin 2/5  Ceftriaxone 2/5  Zosyn 2/5  Cefepime 2/6>>>  Cultures:  Urine culture: 2/5: Pending, blood culture: 2/5: 1 of 2 gram-negative rod pending sensitivities; 2/6 pending growth  DVT prophylaxis: SCDs   Objective: Vitals:   12/29/17 2100 12/30/17 0600 12/30/17 0941 12/30/17 1527  BP: (!) 101/40 (!) 94/50 (!) 97/44 (!) 100/53  Pulse: 79 74 81 73  Resp: 18 18 18 18   Temp: 98.9 F (37.2 C) 98.7 F (37.1 C) 98.9 F (37.2 C) 98.6 F (37 C)  TempSrc: Oral Oral Oral Oral  SpO2: 96% 99% 96% 98%  Weight: 112 kg (246 lb 14.6 oz)     Height: 5\' 5"  (1.651 m)  Intake/Output Summary (Last 24 hours) at 12/30/2017 2008 Last data filed at 12/30/2017 1900 Gross per 24 hour  Intake 480 ml   Output 2900 ml  Net -2420 ml   Filed Weights   12/29/17 2100  Weight: 112 kg (246 lb 14.6 oz)    Exam:  General: Lying in bed in no apparent distress, somnolent on exam but easily arousable HEENT: Dry oral mucosa Cardiovascular: Regular rate and rhythm, no peripheral edema Gastrointestinal: Soft, nondistended, nontender, decreased bowel sounds GU: Foley catheter in place draining dark brown urine   Data Reviewed: CBC: Recent Labs  Lab 12/29/17 1126 12/30/17 0542  WBC 15.6* 13.4*  HGB 10.9* 9.8*  HCT 33.4* 31.1*  MCV 93.3 95.4  PLT 194 812*   Basic Metabolic Panel: Recent Labs  Lab 12/29/17 1325 12/29/17 2231 12/30/17 0542  NA 135 137 139  K 4.2 3.9 4.0  CL 106 108 110  CO2 14* 13* 13*  GLUCOSE 87 75 69  BUN 129* 122* 120*  CREATININE 7.15* 6.25* 5.71*  CALCIUM 8.3* 8.2* 8.2*  PHOS  --  4.5 5.0*   GFR: Estimated Creatinine Clearance: 12.4 mL/min (A) (by C-G formula based on SCr of 5.71 mg/dL (H)). Liver Function Tests: Recent Labs  Lab 12/29/17 1156 12/29/17 2231 12/30/17 0542  AST 16  --   --   ALT 14  --   --   ALKPHOS 49  --   --   BILITOT 0.8  --   --   PROT 6.1*  --   --   ALBUMIN 2.6* 2.5* 2.3*   No results for input(s): LIPASE, AMYLASE in the last 168 hours. No results for input(s): AMMONIA in the last 168 hours. Coagulation Profile: Recent Labs  Lab 12/28/17 1446 12/29/17 1126 12/30/17 1205  INR >8 8.61* 1.38   Cardiac Enzymes: No results for input(s): CKTOTAL, CKMB, CKMBINDEX, TROPONINI in the last 168 hours. BNP (last 3 results) No results for input(s): PROBNP in the last 8760 hours. HbA1C: No results for input(s): HGBA1C in the last 72 hours. CBG: No results for input(s): GLUCAP in the last 168 hours. Lipid Profile: No results for input(s): CHOL, HDL, LDLCALC, TRIG, CHOLHDL, LDLDIRECT in the last 72 hours. Thyroid Function Tests: No results for input(s): TSH, T4TOTAL, FREET4, T3FREE, THYROIDAB in the last 72 hours. Anemia  Panel: No results for input(s): VITAMINB12, FOLATE, FERRITIN, TIBC, IRON, RETICCTPCT in the last 72 hours. Urine analysis:    Component Value Date/Time   COLORURINE AMBER (A) 12/29/2017 1311   APPEARANCEUR CLOUDY (A) 12/29/2017 1311   LABSPEC 1.010 12/29/2017 1311   PHURINE 8.0 12/29/2017 1311   GLUCOSEU NEGATIVE 12/29/2017 1311   GLUCOSEU NEGATIVE 11/10/2017 1216   HGBUR SMALL (A) 12/29/2017 1311   BILIRUBINUR NEGATIVE 12/29/2017 1311   KETONESUR NEGATIVE 12/29/2017 1311   PROTEINUR 100 (A) 12/29/2017 1311   UROBILINOGEN 0.2 11/10/2017 1216   NITRITE NEGATIVE 12/29/2017 1311   LEUKOCYTESUR LARGE (A) 12/29/2017 1311   Sepsis Labs: @LABRCNTIP (procalcitonin:4,lacticidven:4)  ) Recent Results (from the past 240 hour(s))  Blood Culture (routine x 2)     Status: None (Preliminary result)   Collection Time: 12/29/17  1:20 PM  Result Value Ref Range Status   Specimen Description LEFT ANTECUBITAL  Final   Special Requests   Final    BOTTLES DRAWN AEROBIC AND ANAEROBIC Blood Culture adequate volume   Culture  Setup Time   Final    GRAM NEGATIVE RODS AEROBIC BOTTLE ONLY Organism ID to follow CRITICAL RESULT  CALLED TO, READ BACK BY AND VERIFIED WITH: A MASTERS PHARMD 12/30/17 0605 JDW    Culture   Final    NO GROWTH 1 DAY Performed at [facility], [place] [address], [place], [place] [address]    Report Status PENDING  Incomplete  Blood Culture ID Panel (Reflexed)     Status: Abnormal   Collection Time: 12/29/17  1:20 PM  Result Value Ref Range Status   Enterococcus species NOT DETECTED NOT DETECTED Final   Listeria monocytogenes NOT DETECTED NOT DETECTED Final   Staphylococcus species NOT DETECTED NOT DETECTED Final   Staphylococcus aureus NOT DETECTED NOT DETECTED Final   Streptococcus species NOT DETECTED NOT DETECTED Final   Streptococcus agalactiae NOT DETECTED NOT DETECTED Final   Streptococcus pneumoniae NOT DETECTED NOT DETECTED Final   Streptococcus pyogenes NOT  DETECTED NOT DETECTED Final   Acinetobacter baumannii NOT DETECTED NOT DETECTED Final   Enterobacteriaceae species DETECTED (A) NOT DETECTED Final    Comment: Enterobacteriaceae represent a large family of gram-negative bacteria, not a single organism. CRITICAL RESULT CALLED TO, READ BACK BY AND VERIFIED WITH: A MASTERS PHARMD 12/30/17 0605 JDW    Enterobacter cloacae complex DETECTED (A) NOT DETECTED Final    Comment: CRITICAL RESULT CALLED TO, READ BACK BY AND VERIFIED WITH: A MASTERS PHARMD 12/30/17 0605 JDW    Escherichia coli NOT DETECTED NOT DETECTED Final   Klebsiella oxytoca NOT DETECTED NOT DETECTED Final   Klebsiella pneumoniae NOT DETECTED NOT DETECTED Final   Proteus species NOT DETECTED NOT DETECTED Final   Serratia marcescens NOT DETECTED NOT DETECTED Final   Carbapenem resistance NOT DETECTED NOT DETECTED Final   Haemophilus influenzae NOT DETECTED NOT DETECTED Final   Neisseria meningitidis NOT DETECTED NOT DETECTED Final   Pseudomonas aeruginosa NOT DETECTED NOT DETECTED Final   Candida albicans NOT DETECTED NOT DETECTED Final   Candida glabrata NOT DETECTED NOT DETECTED Final   Candida krusei NOT DETECTED NOT DETECTED Final   Candida parapsilosis NOT DETECTED NOT DETECTED Final   Candida tropicalis NOT DETECTED NOT DETECTED Final  Blood Culture (routine x 2)     Status: None (Preliminary result)   Collection Time: 12/29/17  3:00 PM  Result Value Ref Range Status   Specimen Description BLOOD LEFT ARM  Final   Special Requests   Final    BOTTLES DRAWN AEROBIC AND ANAEROBIC Blood Culture adequate volume   Culture   Final    NO GROWTH < 24 HOURS Performed at [facility], [address], [place], [place] [address]    Report Status PENDING  Incomplete      Studies: No results found.  Scheduled Meds: . amiodarone  200 mg Oral Daily    Continuous Infusions: . ceFEPime (MAXIPIME) IV Stopped (12/30/17 1140)  .  sodium bicarbonate (isotonic) infusion in  sterile water 150 mEq (12/30/17 1311)     LOS: 1 day     Desiree Hane, MD [facility] Pager [phone]  If 7PM-7AM, please contact night-coverage [email or web address] Password [email or web address] 12/30/2017, 8:08 PM

## 2017-12-31 ENCOUNTER — Ambulatory Visit: Payer: BLUE CROSS/BLUE SHIELD | Admitting: Adult Health

## 2017-12-31 DIAGNOSIS — D649 Anemia, unspecified: Secondary | ICD-10-CM

## 2017-12-31 DIAGNOSIS — I959 Hypotension, unspecified: Secondary | ICD-10-CM

## 2017-12-31 DIAGNOSIS — B9689 Other specified bacterial agents as the cause of diseases classified elsewhere: Secondary | ICD-10-CM

## 2017-12-31 LAB — CBC
HEMATOCRIT: 29.5 % — AB (ref 36.0–46.0)
HEMOGLOBIN: 9.7 g/dL — AB (ref 12.0–15.0)
MCH: 29.8 pg (ref 26.0–34.0)
MCHC: 32.9 g/dL (ref 30.0–36.0)
MCV: 90.8 fL (ref 78.0–100.0)
Platelets: 139 10*3/uL — ABNORMAL LOW (ref 150–400)
RBC: 3.25 MIL/uL — ABNORMAL LOW (ref 3.87–5.11)
RDW: 15.3 % (ref 11.5–15.5)
WBC: 11.3 10*3/uL — ABNORMAL HIGH (ref 4.0–10.5)

## 2017-12-31 LAB — C DIFFICILE QUICK SCREEN W PCR REFLEX
C DIFFICILE (CDIFF) INTERP: NOT DETECTED
C Diff antigen: NEGATIVE
C Diff toxin: NEGATIVE

## 2017-12-31 LAB — RENAL FUNCTION PANEL
ANION GAP: 13 (ref 5–15)
Albumin: 2.1 g/dL — ABNORMAL LOW (ref 3.5–5.0)
BUN: 82 mg/dL — ABNORMAL HIGH (ref 6–20)
CO2: 18 mmol/L — AB (ref 22–32)
Calcium: 8.2 mg/dL — ABNORMAL LOW (ref 8.9–10.3)
Chloride: 109 mmol/L (ref 101–111)
Creatinine, Ser: 3.01 mg/dL — ABNORMAL HIGH (ref 0.44–1.00)
GFR calc Af Amer: 18 mL/min — ABNORMAL LOW (ref >60)
GFR calc non Af Amer: 15 mL/min — ABNORMAL LOW (ref >60)
GLUCOSE: 105 mg/dL — AB (ref 65–99)
POTASSIUM: 3.4 mmol/L — AB (ref 3.5–5.1)
Phosphorus: 3.1 mg/dL (ref 2.5–4.6)
Sodium: 140 mmol/L (ref 135–145)

## 2017-12-31 LAB — HEPARIN LEVEL (UNFRACTIONATED)
HEPARIN UNFRACTIONATED: 0.23 [IU]/mL — AB (ref 0.30–0.70)
Heparin Unfractionated: <0.1 IU/mL — ABNORMAL LOW (ref 0.30–0.70)
Heparin Unfractionated: <0.1 IU/mL — ABNORMAL LOW (ref 0.30–0.70)

## 2017-12-31 LAB — PROTIME-INR
INR: 1.39
PROTHROMBIN TIME: 16.9 s — AB (ref 11.4–15.2)

## 2017-12-31 MED ORDER — SODIUM BICARBONATE 650 MG PO TABS
650.0000 mg | ORAL_TABLET | Freq: Three times a day (TID) | ORAL | Status: DC
  Administered 2017-12-31 – 2018-01-01 (×4): 650 mg via ORAL
  Filled 2017-12-31 (×4): qty 1

## 2017-12-31 MED ORDER — LOPERAMIDE HCL 2 MG PO CAPS
2.0000 mg | ORAL_CAPSULE | ORAL | Status: DC | PRN
  Administered 2017-12-31 – 2018-01-01 (×3): 2 mg via ORAL
  Filled 2017-12-31 (×3): qty 1

## 2017-12-31 NOTE — Progress Notes (Signed)
ANTICOAGULATION CONSULT NOTE - Initial Consult  Pharmacy Consult for heparin Indication: atrial fibrillation  Patient Measurements: Height: 5\' 5"  (165.1 cm) Weight: 246 lb 14.6 oz (112 kg) IBW/kg (Calculated) : 57 Heparin Dosing Weight: 83.5 kg  Labs: Recent Labs    12/29/17 1126  12/29/17 2231 12/30/17 0542 12/30/17 1205 12/31/17 0459 12/31/17 1321  HGB 10.9*  --   --  9.8*  --  9.7*  --   HCT 33.4*  --   --  31.1*  --  29.5*  --   PLT 194  --   --  147*  --  139*  --   LABPROT 70.4*  --   --   --  16.9* 16.9*  --   INR 8.61*  --   --   --  1.38 1.39  --   HEPARINUNFRC  --   --   --   --   --  <0.10* <0.10*  CREATININE  --    < > 6.25* 5.71*  --  3.01*  --    < > = values in this interval not displayed.   Assessment: 67 yo female on chronic Coumadin for afib, held on admission for supratherapeutic INR.  INR now < 2 after vitamin K given 2/5.  Pharmacy asked to start anticoagulation with IV heparin.  Heparin level has returned subtherapeutic again with an undetectable level. Spoke with nursing who is checking that the lines are infusing appropriately, as I would have expected a response with the previous dose escalation. Will increase infusion again. Hgb stable, no bleeding noted.  Goal of Therapy:  Heparin level 0.3-0.7 units/ml Monitor platelets by anticoagulation protocol: Yes   Plan:  1. Increase IV heparin to 1650 units/hr. 2. Check heparin level in 8 hrs. 3. Daily heparin level and CBC. 4. F/u ability to restart Coumadin.   Patterson Hammersmith PharmD PGY1 Pharmacy Practice Resident 12/31/2017 2:39 PM Phone: [phone]

## 2017-12-31 NOTE — Progress Notes (Signed)
PROGRESS NOTE  Patricia Wagner SWN:[id number] DOB: July 30, 1951 DOA: 12/29/2017 PCP: Biagio Borg, MD  HPI/Recap of past 24 hours:  Patricia Wagner is a 67 y.o. year old female with medical history significant for atrial fibrillation on Coumadin, chronic systolic congestive heart failure, CAD, hypertension, hyperlipidemia, CKD stage III, previous CVA, COPD, reknot syndrome who presented on 12/29/2017 with weakness and slurred speech and was found to have AKI on CKD with anion gap metabolic acidosis concerning for obstructive uropathy, gram-negative bacteremia and supratherapeutic INR.   Subjective No events overnight. Doing well. Eating well. Having BMs. No abd pain  Assessment/Plan: Principal Problem:   Acute renal failure superimposed on chronic kidney disease ([place]) Active Problems:   Essential hypertension   Paroxysmal atrial fibrillation (HCC)   Urinary retention   Anemia   Supratherapeutic INR   Acute UTI   Bacteremia due to Gram-negative bacteria   Increased anion gap metabolic acidosis  Enterobacter bacteremia Most likely secondary to suspected UTI, unclear organism currently. Still on broad spectrum cefepime while awaiting sensitivities. F/u  blood cultures on 2/6 to ensure clearance.  Follow-up urine culture and blood cultures for sensitivities.    AKI on CKD stage III with anion gap metabolic acidosis, improving Suspect most likely related to post renal since patient's creatinine improved with administration of Foley catheter upon admission.  Some concern for potential prerenal etiology given reports of decreased oral intake and possible emesis peak creatinine of 7.15 on admission, previous baseline of 1.2-1.4.  D/c fluids to prevent any CHF exacerbation and encourage PO intake. Continue to monitor on BMP  Obstructive uropathy, unclear etiology Relatively recent problem diagnosed during recent admission (12/08/17) imaging at that time of ( CT abdomen, renal ultrasound) were  unrevealing.  Patient was started on Flomax at previous hospitalization with plans to follow-up with urology as outpatient.  Empirically being treated for UTI given leukocytes on UA, follow-up urine culture  Chronic atrial fibrillation, rate controlled complicated by supratherapeutic INR, resolved Held home Coumadin in setting of supratherapeutic INR on admission (8.61), status post vitamin K now 1.38.  Continue home amiodarone.  Given prior history of stroke currently on heparin given subtherapeutic INR, will continue to monitor for any signs of bleeding (some clots in foley, will continue to monitor), plan to start coumadin since no bleeding.   Anemia of chronic disease, stable Baseline hemoglobin 10.7, has been relatively stable at baseline today's hemoglobin 9.7.  Continue to monitor on CBC  Chronic systolic heart failure, nonischemic cardiomyopathy,approaching euvolemia Holding home diuretics currently will continue to assess to assure no volume overload  Code Status: Full code  Family Communication: No family at bedside  Disposition Plan: Needs to have therapeutic INR, continue improving creatinine   Consultants:  Nephrology  Procedures:  None  Antimicrobials:  Vancomycin 2/5  Ceftriaxone 2/5  Zosyn 2/5  Cefepime 2/6>>>  Cultures:  Urine culture: 2/5: Pending, blood culture: 2/5: 1 of 2 gram-negative rod pending sensitivities; 2/6 pending growth  DVT prophylaxis: SCDs   Objective: Vitals:   12/30/17 1527 12/30/17 2143 12/31/17 0515 12/31/17 1055  BP: (!) 100/53 121/70 113/78 (!) 123/56  Pulse: 73 78 74 76  Resp: 18 17 18 18   Temp: 98.6 F (37 C) 98.5 F (36.9 C) 98.4 F (36.9 C) 98.3 F (36.8 C)  TempSrc: Oral Oral Oral Oral  SpO2: 98% 97% 94% 96%  Weight:      Height:        Intake/Output Summary (Last 24 hours) at 12/31/2017  [place] filed at 12/31/2017 1056 Gross per 24 hour  Intake 2191.35 ml  Output 1750 ml  Net 441.35 ml   Filed  Weights   12/29/17 2100  Weight: 112 kg (246 lb 14.6 oz)    Exam:  General: Lying in bed in no apparent distress, somnolent on exam but easily arousable HEENT: Dry oral mucosa Cardiovascular: Regular rate and rhythm, no peripheral edema Gastrointestinal: Soft, nondistended, nontender, decreased bowel sounds GU: Foley catheter in place draining dark brown urine   Data Reviewed: CBC: Recent Labs  Lab 12/29/17 1126 12/30/17 0542 12/31/17 0459  WBC 15.6* 13.4* 11.3*  HGB 10.9* 9.8* 9.7*  HCT 33.4* 31.1* 29.5*  MCV 93.3 95.4 90.8  PLT 194 147* 350*   Basic Metabolic Panel: Recent Labs  Lab 12/29/17 1325 12/29/17 2231 12/30/17 0542 12/31/17 0459  NA 135 137 139 140  K 4.2 3.9 4.0 3.4*  CL 106 108 110 109  CO2 14* 13* 13* 18*  GLUCOSE 87 75 69 105*  BUN 129* 122* 120* 82*  CREATININE 7.15* 6.25* 5.71* 3.01*  CALCIUM 8.3* 8.2* 8.2* 8.2*  PHOS  --  4.5 5.0* 3.1   GFR: Estimated Creatinine Clearance: 23.5 mL/min (A) (by C-G formula based on SCr of 3.01 mg/dL (H)). Liver Function Tests: Recent Labs  Lab 12/29/17 1156 12/29/17 2231 12/30/17 0542 12/31/17 0459  AST 16  --   --   --   ALT 14  --   --   --   ALKPHOS 49  --   --   --   BILITOT 0.8  --   --   --   PROT 6.1*  --   --   --   ALBUMIN 2.6* 2.5* 2.3* 2.1*   No results for input(s): LIPASE, AMYLASE in the last 168 hours. No results for input(s): AMMONIA in the last 168 hours. Coagulation Profile: Recent Labs  Lab 12/28/17 1446 12/29/17 1126 12/30/17 1205 12/31/17 0459  INR >8 8.61* 1.38 1.39   Cardiac Enzymes: No results for input(s): CKTOTAL, CKMB, CKMBINDEX, TROPONINI in the last 168 hours. BNP (last 3 results) No results for input(s): PROBNP in the last 8760 hours. HbA1C: No results for input(s): HGBA1C in the last 72 hours. CBG: No results for input(s): GLUCAP in the last 168 hours. Lipid Profile: No results for input(s): CHOL, HDL, LDLCALC, TRIG, CHOLHDL, LDLDIRECT in the last 72  hours. Thyroid Function Tests: No results for input(s): TSH, T4TOTAL, FREET4, T3FREE, THYROIDAB in the last 72 hours. Anemia Panel: No results for input(s): VITAMINB12, FOLATE, FERRITIN, TIBC, IRON, RETICCTPCT in the last 72 hours. Urine analysis:    Component Value Date/Time   COLORURINE AMBER (A) 12/29/2017 1311   APPEARANCEUR CLOUDY (A) 12/29/2017 1311   LABSPEC 1.010 12/29/2017 1311   PHURINE 8.0 12/29/2017 1311   GLUCOSEU NEGATIVE 12/29/2017 1311   GLUCOSEU NEGATIVE 11/10/2017 1216   HGBUR SMALL (A) 12/29/2017 1311   BILIRUBINUR NEGATIVE 12/29/2017 1311   KETONESUR NEGATIVE 12/29/2017 1311   PROTEINUR 100 (A) 12/29/2017 1311   UROBILINOGEN 0.2 11/10/2017 1216   NITRITE NEGATIVE 12/29/2017 1311   LEUKOCYTESUR LARGE (A) 12/29/2017 1311   Sepsis Labs: @LABRCNTIP (procalcitonin:4,lacticidven:4)  ) Recent Results (from the past 240 hour(s))  Urine Culture     Status: Abnormal (Preliminary result)   Collection Time: 12/29/17  1:11 PM  Result Value Ref Range Status   Specimen Description URINE, CATHETERIZED  Final   Special Requests NONE  Final   Culture (A)  Final    >=  100,000 COLONIES/mL CITROBACTER FREUNDII SUSCEPTIBILITIES TO FOLLOW Performed at [facility], [place] [address] [place], [place] [address]    Report Status PENDING  Incomplete  Blood Culture (routine x 2)     Status: Abnormal (Preliminary result)   Collection Time: 12/29/17  1:20 PM  Result Value Ref Range Status   Specimen Description LEFT ANTECUBITAL  Final   Special Requests   Final    BOTTLES DRAWN AEROBIC AND ANAEROBIC Blood Culture adequate volume   Culture  Setup Time   Final    GRAM NEGATIVE RODS AEROBIC BOTTLE ONLY CRITICAL RESULT CALLED TO, READ BACK BY AND VERIFIED WITH: A MASTERS PHARMD 12/30/17 0605 JDW    Culture (A)  Final    ENTEROBACTER CLOACAE SUSCEPTIBILITIES TO FOLLOW Performed at [facility], [place] [address], [place], [place] [address]    Report Status PENDING   Incomplete  Blood Culture ID Panel (Reflexed)     Status: Abnormal   Collection Time: 12/29/17  1:20 PM  Result Value Ref Range Status   Enterococcus species NOT DETECTED NOT DETECTED Final   Listeria monocytogenes NOT DETECTED NOT DETECTED Final   Staphylococcus species NOT DETECTED NOT DETECTED Final   Staphylococcus aureus NOT DETECTED NOT DETECTED Final   Streptococcus species NOT DETECTED NOT DETECTED Final   Streptococcus agalactiae NOT DETECTED NOT DETECTED Final   Streptococcus pneumoniae NOT DETECTED NOT DETECTED Final   Streptococcus pyogenes NOT DETECTED NOT DETECTED Final   Acinetobacter baumannii NOT DETECTED NOT DETECTED Final   Enterobacteriaceae species DETECTED (A) NOT DETECTED Final    Comment: Enterobacteriaceae represent a large family of gram-negative bacteria, not a single organism. CRITICAL RESULT CALLED TO, READ BACK BY AND VERIFIED WITH: A MASTERS PHARMD 12/30/17 0605 JDW    Enterobacter cloacae complex DETECTED (A) NOT DETECTED Final    Comment: CRITICAL RESULT CALLED TO, READ BACK BY AND VERIFIED WITH: A MASTERS PHARMD 12/30/17 0605 JDW    Escherichia coli NOT DETECTED NOT DETECTED Final   Klebsiella oxytoca NOT DETECTED NOT DETECTED Final   Klebsiella pneumoniae NOT DETECTED NOT DETECTED Final   Proteus species NOT DETECTED NOT DETECTED Final   Serratia marcescens NOT DETECTED NOT DETECTED Final   Carbapenem resistance NOT DETECTED NOT DETECTED Final   Haemophilus influenzae NOT DETECTED NOT DETECTED Final   Neisseria meningitidis NOT DETECTED NOT DETECTED Final   Pseudomonas aeruginosa NOT DETECTED NOT DETECTED Final   Candida albicans NOT DETECTED NOT DETECTED Final   Candida glabrata NOT DETECTED NOT DETECTED Final   Candida krusei NOT DETECTED NOT DETECTED Final   Candida parapsilosis NOT DETECTED NOT DETECTED Final   Candida tropicalis NOT DETECTED NOT DETECTED Final  Blood Culture (routine x 2)     Status: None (Preliminary result)   Collection Time:  12/29/17  3:00 PM  Result Value Ref Range Status   Specimen Description BLOOD LEFT ARM  Final   Special Requests   Final    BOTTLES DRAWN AEROBIC AND ANAEROBIC Blood Culture adequate volume   Culture   Final    NO GROWTH < 24 HOURS Performed at [facility], [address] [place], [place] [address]    Report Status PENDING  Incomplete  C difficile quick scan w PCR reflex     Status: None   Collection Time: 12/31/17 10:30 AM  Result Value Ref Range Status   C Diff antigen NEGATIVE NEGATIVE Final   C Diff toxin NEGATIVE NEGATIVE Final   C Diff interpretation  No C. difficile detected.  Final    Comment: Performed at [facility], [place] [address], [place], [place] [address]      Studies: No results found.  Scheduled Meds: . amiodarone  200 mg Oral Daily  . sodium bicarbonate  650 mg Oral TID    Continuous Infusions: . ceFEPime (MAXIPIME) IV 1 g (12/31/17 1017)  . heparin 1,650 Units/hr (12/31/17 1523)     LOS: 2 days     Desiree Hane, MD [facility] Pager [phone]  If 7PM-7AM, please contact night-coverage [email or web address] Password [email or web address] 12/31/2017, 3:50 PM

## 2017-12-31 NOTE — Progress Notes (Signed)
ANTICOAGULATION CONSULT NOTE - Follow Up Consult  Pharmacy Consult for heparin Indication: atrial fibrillation  Labs: Recent Labs    12/29/17 1126  12/29/17 2231 12/30/17 0542 12/30/17 1205 12/31/17 0459  HGB 10.9*  --   --  9.8*  --  9.7*  HCT 33.4*  --   --  31.1*  --  29.5*  PLT 194  --   --  147*  --  139*  LABPROT 70.4*  --   --   --  16.9* 16.9*  INR 8.61*  --   --   --  1.38 1.39  HEPARINUNFRC  --   --   --   --   --  <0.10*  CREATININE  --    < > 6.25* 5.71*  --  3.01*   < > = values in this interval not displayed.    Assessment: 67yo female undetectable on heparin with initial dosing while Coumadin on hold.  Goal of Therapy:  Heparin level 0.3-0.7 units/ml   Plan:  Will increase heparin gtt by 4 units/kg/hr to 1500 units/hr (was recently therapeutic at this rate) and check level in 8 hours.    Wynona Neat, PharmD, BCPS  12/31/2017,6:07 AM

## 2017-12-31 NOTE — Progress Notes (Deleted)
Cardiology Office Note   Date:  12/31/2017   ID:  Keondria, Siever [birth year], MRN [id number]  PCP:  Biagio Borg, MD  Cardiologist:  Lubertha South  No chief complaint on file.    History of Present Illness: Patricia Wagner is a 67 y.o. female who presents for ongoing assessment and management of atrial fibrillation, systolic heart failure, with EF of 20% to 25%. Cardiac catheterization November 2017 showed no obstructive coronary disease. Patient had difficulties with recurrent atrial fibrillation undergoing multiple cardioversion attempts. She had bradycardia on sotalol.TEE 5/18 showed severe LV dysfunction, severe RV dysfunction, mild to moderate mitral regurgitation and moderate tricuspid regurgitation.  Recent admission to the hospital with acute kidney injury in the setting of urinary retention.  The patient was slurring her words.  Once Foley catheter was placed symptoms improved lab work.  Discharge creatinine 2.42.  On last office visit the patient's diuretics and Entresto were placed on hold due to elevated creatinine.  Plans to proceed with ICD implantation after atrial fibrillation ablation patient is stable.    Past Medical History:  Diagnosis Date  . Arthritis   . Atrial fibrillation ([place])    a. s/p multiple cardioversions; failed tikosyn/sotalol.  Marland Kitchen CAD in native artery, s/p cardiac cath with non obstructive CAD 10/24/2016  . CHF (congestive heart failure) ([place])   . DIVERTICULITIS, HX OF 07/25/2007  . DIVERTICULOSIS, COLON 07/22/2007  . Dyspnea   . Edema, peripheral    a. chronic BLE edema, R>L. Prior trauma from dog attack and accident.  Marland Kitchen HYPERLIPIDEMIA 02/03/2008  . Hypersomnia    declines w/u  . Hypertension   . MENOPAUSAL DISORDER 01/09/2011  . Morbid obesity ([place]) 07/22/2007  . NICM (nonischemic cardiomyopathy) ([place]) 10/24/2016  . Raynaud's syndrome 07/22/2007  . Stroke ([place]) 2017  . THYROID NODULE, RIGHT 01/04/2010  . VITAMIN D DEFICIENCY 01/09/2011   Qualifier:  Diagnosis of  By: [name], [name]     Past Surgical History:  Procedure Laterality Date  . CARDIAC CATHETERIZATION N/A 10/23/2016   Procedure: Left Heart Cath and Coronary Angiography;  Surgeon: [name], MD;  Location: [place] [facility];  Service: Cardiovascular;  Laterality: N/A;  . CARDIOVERSION N/A 10/31/2016   Procedure: CARDIOVERSION;  Surgeon: [name], MD;  Location: [place];  Service: Cardiovascular;  Laterality: N/A;  . CARDIOVERSION N/A 11/03/2016   Procedure: CARDIOVERSION;  Surgeon: [name], MD;  Location: [place];  Service: Cardiovascular;  Laterality: N/A;  . CARDIOVERSION N/A 11/18/2016   Procedure: CARDIOVERSION;  Surgeon: [name], MD;  Location: [place];  Service: Cardiovascular;  Laterality: N/A;  . CARDIOVERSION N/A 03/25/2017   Procedure: CARDIOVERSION;  Surgeon: [name], MD;  Location: [facility] ENDOSCOPY;  Service: Cardiovascular;  Laterality: N/A;  . CARDIOVERSION N/A 05/06/2017   Procedure: CARDIOVERSION;  Surgeon: [name], MD;  Location: [facility] ENDOSCOPY;  Service: Cardiovascular;  Laterality: N/A;  . CARDIOVERSION N/A 05/12/2017   Procedure: CARDIOVERSION;  Surgeon: [name], MD;  Location: [facility] ENDOSCOPY;  Service: Cardiovascular;  Laterality: N/A;  . COLONOSCOPY N/A 12/04/2017   Procedure: COLONOSCOPY;  Surgeon: [name], MD;  Location: [place];  Service: Endoscopy;  Laterality: N/A;  . COLONOSCOPY W/ POLYPECTOMY  02/2011   pan diverticulosis.  tubular adenoma without dysplasia on 5 mm sigmoid polyp.  Dr Fuller Plan.    Marland Kitchen PARTIAL HYSTERECTOMY     1 OVARY LEFT  . TEE WITHOUT CARDIOVERSION N/A 10/31/2016   Procedure: TRANSESOPHAGEAL ECHOCARDIOGRAM (TEE);  Surgeon: [name], MD;  Location: [facility] ENDOSCOPY;  Service: Cardiovascular;  Laterality: N/A;  . TEE WITHOUT CARDIOVERSION N/A 03/25/2017   Procedure: TRANSESOPHAGEAL ECHOCARDIOGRAM (TEE);  Surgeon: [name], MD;  Location: [facility] ENDOSCOPY;   Service: Cardiovascular;  Laterality: N/A;     No current facility-administered medications for this visit.    No current outpatient medications on file.   Facility-Administered Medications Ordered in Other Visits  Medication Dose Route Frequency Provider Last Rate Last Dose  . acetaminophen (TYLENOL) tablet 650 mg  650 mg Oral Q6H PRN Dionne Milo, NP       Or  . acetaminophen (TYLENOL) suppository 650 mg  650 mg Rectal Q6H PRN Dionne Milo, NP      . amiodarone (PACERONE) tablet 200 mg  200 mg Oral Daily Dionne Milo, NP   200 mg at 12/30/17 0831  . ceFEPIme (MAXIPIME) 1 g in dextrose 5 % 50 mL IVPB  1 g Intravenous Daily Oretha Milch D, MD   Stopped at 12/30/17 1140  . heparin ADULT infusion 100 units/mL (25000 units/237mL sodium chloride 0.45%)  1,500 Units/hr Intravenous Continuous Laren Everts, RPH 15 mL/hr at 12/31/17 0640 1,500 Units/hr at 12/31/17 0640  . ondansetron (ZOFRAN) tablet 4 mg  4 mg Oral Q6H PRN Dionne Milo, NP       Or  . ondansetron [facility]) injection 4 mg  4 mg Intravenous Q6H PRN Dionne Milo, NP      . sodium bicarbonate 150 mEq in sterile water 1,000 mL infusion  150 mEq Intravenous Continuous Elmarie Shiley, MD 125 mL/hr at 12/31/17 0020 150 mEq at 12/31/17 0020    Allergies:   Ace inhibitors    Social History:  The patient  reports that she has been smoking cigarettes.  She has been smoking about 0.50 packs per day. she has never used smokeless tobacco. She reports that she does not drink alcohol or use drugs.   Family History:  The patient's family history includes Asthma in her mother; Diabetes in her father; Heart disease in her father and sister; Lung disease in her sister.    ROS: All other systems are reviewed and negative. Unless otherwise mentioned in H&P    PHYSICAL EXAM: VS:  There were no vitals taken for this visit. , BMI There is no height or weight on file to calculate BMI. GEN: Well nourished, well  developed, in no acute distress  HEENT: normal  Neck: no JVD, carotid bruits, or masses Cardiac: ***RRR; no murmurs, rubs, or gallops,no edema  Respiratory:  clear to auscultation bilaterally, normal work of breathing GI: soft, nontender, nondistended, + BS MS: no deformity or atrophy  Skin: warm and dry, no rash Neuro:  Strength and sensation are intact Psych: euthymic mood, full affect   EKG:  EKG {ACTION; IS/IS EUM:[id number]} ordered today. The ekg ordered today demonstrates ***   Recent Labs: 04/29/2017: B Natriuretic Peptide 2,712.1 07/21/2017: TSH 0.54 12/08/2017: Magnesium 1.8 12/29/2017: ALT 14 12/31/2017: BUN 82; Creatinine, Ser 3.01; Hemoglobin 9.7; Platelets 139; Potassium 3.4; Sodium 140    Lipid Panel    Component Value Date/Time   CHOL 94 12/05/2017 1203   TRIG 54 12/05/2017 1203   HDL 47 12/05/2017 1203   CHOLHDL 2.0 12/05/2017 1203   VLDL 11 12/05/2017 1203   LDLCALC 36 12/05/2017 1203   LDLDIRECT 121.6 03/15/2012 1646      Wt Readings from Last 3 Encounters:  12/29/17 246 lb 14.6 oz (112 kg)  12/28/17 226 lb 6.4 oz (102.7 kg)  12/17/17 223 lb (101.2 kg)      Other studies Reviewed: Additional studies/ records that were reviewed today include: ***. Review of the above records demonstrates: ***   ASSESSMENT AND PLAN:  1.  ***   Current medicines are reviewed at length with the patient today.    Labs/ tests ordered today include: *** Phill Myron. [place] P[place], ANP, AACC   12/31/2017 7:41 AM    [facility] [address], [place], [place] [address] Phone: [phone]; Fax: [phone]

## 2017-12-31 NOTE — Progress Notes (Signed)
Patient ID: Patricia Wagner, female   DOB: February 20, 1951, 67 y.o.   MRN: [id number]  [facility] Progress Note   Assessment/ Plan:   1. Acute kidney injury on chronic kidney disease stage III: suspected to be from volume depletion in conjunction with obstructive uropathy/bladder outlet obstruction that has improved with intravenous fluids/indwelling Foley catheter. We'll discontinue fluids at this time given risk for CHF exacerbation and allow her to eat/drink normally. Would recommend restarting diuretics when renal function is back to her baseline- suspect that yet again, she'll be discharged home with an indwelling Foley catheter to follow up with urology.. 2. Anion gap metabolic acidosis: Secondary to acute kidney injury and possible distal RTA from obstruction compounding metabolic acidosis. Improved with isotonic sodium bicarbonate-will discontinue this at this time. 3. Obstructive uropathy with associated urinary tract infection: urine Gram stain significant for gram-negative rods-speciation and sensitivities pending. She was intermittently self catheterizing herself prior to admission and I suspect that this might have been the biggest risk factor in her current presentation. On broad-spectrum antimicrobial coverage with cefepime. 4. Hypotension: blood pressures improved with intravenous fluids-continue to monitor.  5. Supratherapeutic INR: Hold Coumadin at this time especially with hematuria. 6. Anemia: Monitor hemoglobin/hematocrit trend  Subjective:   Reports to be feeling well, somewhat fatigued.    Objective:   BP (!) 123/56 (BP Location: Left Arm)   Pulse 76   Temp 98.3 F (36.8 C) (Oral)   Resp 18   Ht 5\' 5"  (1.651 m)   Wt 112 kg (246 lb 14.6 oz)   SpO2 96%   BMI 41.09 kg/m   Intake/Output Summary (Last 24 hours) at 12/31/2017 1108 Last data filed at 12/31/2017 1056 Gross per 24 hour  Intake 2671.35 ml  Output 1750 ml  Net 921.35 ml   Weight change:   Physical  Exam: Gen: Comfortable resting in bed CVS: Pulse irregularly irregular, normal rate, S1 and S2 normal Resp: Poor inspiratory effort with decreased breath sounds over bases, no rales/rhonchi Abd: Soft, flat, and mild tenderness over suprapubic area persists Ext: No edema over ankles  Imaging: Ct Head Wo Contrast  Result Date: 12/29/2017 CLINICAL DATA:  Slurred speech and generalized weakness for 4 days. EXAM: CT HEAD WITHOUT CONTRAST TECHNIQUE: Contiguous axial images were obtained from the base of the skull through the vertex without intravenous contrast. COMPARISON:  04/29/2017 and prior CTs FINDINGS: Brain: No evidence of acute infarction, hemorrhage, hydrocephalus, extra-axial collection or mass lesion/mass effect. Mild atrophy and remote left cerebellar and posterior right parietal infarcts again noted. Vascular: Atherosclerotic calcifications identified. Skull: Normal. Negative for fracture or focal lesion. Sinuses/Orbits: No acute abnormality Other: None IMPRESSION: 1. No evidence of acute intracranial abnormality 2. Remote left cerebellar and posterior right parietal infarcts. Electronically Signed   By: Margarette Canada M.D.   On: 12/29/2017 16:21   Dg Chest Port 1 View  Result Date: 12/29/2017 CLINICAL DATA:  Fever and weakness EXAM: PORTABLE CHEST 1 VIEW COMPARISON:  11/30/2017 FINDINGS: Cardiac shadow is stable. Aortic calcifications are noted. Lungs are well aerated bilaterally. Degenerative changes of the thoracic spine are seen. IMPRESSION: No acute abnormality noted. Electronically Signed   By: Inez Catalina M.D.   On: 12/29/2017 12:17    Labs: BMET Recent Labs  Lab 12/29/17 1325 12/29/17 2231 12/30/17 0542 12/31/17 0459  NA 135 137 139 140  K 4.2 3.9 4.0 3.4*  CL 106 108 110 109  CO2 14* 13* 13* 18*  GLUCOSE 87 75 69 105*  BUN 129* 122* 120* 82*  CREATININE 7.15* 6.25* 5.71* 3.01*  CALCIUM 8.3* 8.2* 8.2* 8.2*  PHOS  --  4.5 5.0* 3.1   CBC Recent Labs  Lab 12/29/17 1126  12/30/17 0542 12/31/17 0459  WBC 15.6* 13.4* 11.3*  HGB 10.9* 9.8* 9.7*  HCT 33.4* 31.1* 29.5*  MCV 93.3 95.4 90.8  PLT 194 147* 139*    Medications:    . amiodarone  200 mg Oral Daily   Elmarie Shiley, MD 12/31/2017, 11:08 AM

## 2018-01-01 LAB — RENAL FUNCTION PANEL
ALBUMIN: 2.1 g/dL — AB (ref 3.5–5.0)
ANION GAP: 11 (ref 5–15)
BUN: 49 mg/dL — ABNORMAL HIGH (ref 6–20)
CALCIUM: 8.2 mg/dL — AB (ref 8.9–10.3)
CO2: 24 mmol/L (ref 22–32)
Chloride: 108 mmol/L (ref 101–111)
Creatinine, Ser: 1.74 mg/dL — ABNORMAL HIGH (ref 0.44–1.00)
GFR calc Af Amer: 35 mL/min — ABNORMAL LOW (ref >60)
GFR, EST NON AFRICAN AMERICAN: 30 mL/min — AB (ref >60)
GLUCOSE: 105 mg/dL — AB (ref 65–99)
PHOSPHORUS: 1.8 mg/dL — AB (ref 2.5–4.6)
Potassium: 3.1 mmol/L — ABNORMAL LOW (ref 3.5–5.1)
SODIUM: 143 mmol/L (ref 135–145)

## 2018-01-01 LAB — URINE CULTURE: Culture: >=100000 — AB

## 2018-01-01 LAB — HEPARIN LEVEL (UNFRACTIONATED): Heparin Unfractionated: 0.33 IU/mL (ref 0.30–0.70)

## 2018-01-01 LAB — PROTIME-INR
INR: 1.34
Prothrombin Time: 16.5 seconds — ABNORMAL HIGH (ref 11.4–15.2)

## 2018-01-01 MED ORDER — CIPROFLOXACIN HCL 500 MG PO TABS
500.0000 mg | ORAL_TABLET | Freq: Two times a day (BID) | ORAL | Status: DC
  Administered 2018-01-01 – 2018-01-08 (×14): 500 mg via ORAL
  Filled 2018-01-01 (×14): qty 1

## 2018-01-01 MED ORDER — DEXTROSE 5 % IV SOLN
1.0000 g | INTRAVENOUS | Status: AC
  Administered 2018-01-01: 1 g via INTRAVENOUS
  Filled 2018-01-01: qty 1

## 2018-01-01 MED ORDER — SODIUM BICARBONATE 650 MG PO TABS
650.0000 mg | ORAL_TABLET | Freq: Two times a day (BID) | ORAL | Status: DC
  Administered 2018-01-01 – 2018-01-03 (×4): 650 mg via ORAL
  Filled 2018-01-01 (×4): qty 1

## 2018-01-01 MED ORDER — WARFARIN - PHARMACIST DOSING INPATIENT
Freq: Every day | Status: DC
  Administered 2018-01-04: 18:00:00

## 2018-01-01 MED ORDER — WARFARIN SODIUM 6 MG PO TABS
6.0000 mg | ORAL_TABLET | Freq: Once | ORAL | Status: AC
  Administered 2018-01-01: 6 mg via ORAL
  Filled 2018-01-01: qty 1

## 2018-01-01 MED ORDER — DEXTROSE 5 % IV SOLN: 2.0000 g | Freq: Every day | INTRAVENOUS | Status: DC

## 2018-01-01 NOTE — Progress Notes (Signed)
ANTICOAGULATION CONSULT NOTE  Pharmacy Consult for heparin Indication: atrial fibrillation  Patient Measurements: Height: 5\' 5"  (165.1 cm) Weight: 247 lb 5.7 oz (112.2 kg) IBW/kg (Calculated) : 57 Heparin Dosing Weight: 83.5 kg  Labs: Recent Labs    12/29/17 1126  12/29/17 2231 12/30/17 0542 12/30/17 1205 12/31/17 0459 12/31/17 1321 12/31/17 2246  HGB 10.9*  --   --  9.8*  --  9.7*  --   --   HCT 33.4*  --   --  31.1*  --  29.5*  --   --   PLT 194  --   --  147*  --  139*  --   --   LABPROT 70.4*  --   --   --  16.9* 16.9*  --   --   INR 8.61*  --   --   --  1.38 1.39  --   --   HEPARINUNFRC  --   --   --   --   --  <0.10* <0.10* 0.23*  CREATININE  --    < > 6.25* 5.71*  --  3.01*  --   --    < > = values in this interval not displayed.   Assessment: 67 yo female on chronic Coumadin for afib, held on admission for supratherapeutic INR.  INR now < 2 after vitamin K given 2/5. Heparin level is subtherapeutic but increasing. She was admitted with hematuria due to self-cath but now resolved.    Goal of Therapy:  Heparin level 0.3-0.7 units/ml Monitor platelets by anticoagulation protocol: Yes   Plan:  1. Increase IV heparin to 1850 units/hr 2. Check heparin level in 8 hrs 3. Daily heparin level and CBC   Harvel Quale  01/01/2018 12:06 AM

## 2018-01-01 NOTE — Progress Notes (Signed)
Patient ID: Patricia Wagner, female   DOB: [birth year], 67 y.o.   MRN: [id number]  [facility] Progress Note   Assessment/ Plan:   1. Acute kidney injury on chronic kidney disease stage III: suspected to be from volume depletion in conjunction with obstructive uropathy/bladder outlet obstruction that has improved with intravenous fluids/indwelling Foley catheter. Intravenous fluids discontinued and renal function continues to show gradual improvement with fair urine output. Recommend continued follow-up with urology upon discharge. Diuretic therapy can be resumed as an outpatient 2. Anion gap metabolic acidosis: Secondary to acute kidney injury and possible distal RTA from obstruction compounding metabolic acidosis. With current serum bicarbonate levels, decreased sodium bicarbonate dosing. 3. Obstructive uropathy with associated Citrobacter urinary tract infection: on broad-spectrum antimicrobial coverage with cefepime-culture/sensitivity is pending. 4. Hypotension: blood pressures improved with intravenous fluids-continue to monitor.  5. Supratherapeutic INR: Hold Coumadin at this time especially with hematuria. 6. Anemia: Monitor hemoglobin/hematocrit trend  With her renal recovery- will sign off at this time-please call with questions or concerns.  Subjective:   Reports to be feeling better-uncomfortable night in hospital bed.    Objective:   BP 118/76 (BP Location: Left Arm)   Pulse 69   Temp 98.1 F (36.7 C) (Oral)   Resp 20   Ht 5\' 5"  (1.651 m)   Wt 112.2 kg (247 lb 5.7 oz)   SpO2 100%   BMI 41.16 kg/m   Intake/Output Summary (Last 24 hours) at 01/01/2018 0903 Last data filed at 01/01/2018 5643 Gross per 24 hour  Intake 1002.37 ml  Output 900 ml  Net 102.37 ml   Weight change:   Physical Exam: Gen: Comfortable resting in bed CVS: Pulse irregularly irregular, normal rate, S1 and S2 normal Resp: decreased breath sounds over bases, no rales/rhonchi Abd: Soft, flat,  and mild tenderness over suprapubic area persists Ext: No edema over ankles  Imaging: No results found.  Labs: BMET Recent Labs  Lab 12/29/17 1325 12/29/17 2231 12/30/17 0542 12/31/17 0459 01/01/18 0545  NA 135 137 139 140 143  K 4.2 3.9 4.0 3.4* 3.1*  CL 106 108 110 109 108  CO2 14* 13* 13* 18* 24  GLUCOSE 87 75 69 105* 105*  BUN 129* 122* 120* 82* 49*  CREATININE 7.15* 6.25* 5.71* 3.01* 1.74*  CALCIUM 8.3* 8.2* 8.2* 8.2* 8.2*  PHOS  --  4.5 5.0* 3.1 1.8*   CBC Recent Labs  Lab 12/29/17 1126 12/30/17 0542 12/31/17 0459  WBC 15.6* 13.4* 11.3*  HGB 10.9* 9.8* 9.7*  HCT 33.4* 31.1* 29.5*  MCV 93.3 95.4 90.8  PLT 194 147* 139*    Medications:    . amiodarone  200 mg Oral Daily  . sodium bicarbonate  650 mg Oral TID   Elmarie Shiley, MD 01/01/2018, 9:03 AM

## 2018-01-01 NOTE — Progress Notes (Signed)
PHARMACY NOTE:  ANTIMICROBIAL RENAL DOSAGE ADJUSTMENT  Current antimicrobial regimen includes a mismatch between antimicrobial dosage and estimated renal function.  As per policy approved by the Pharmacy & Therapeutics and Medical Executive Committees, the antimicrobial dosage will be adjusted accordingly.  Current antimicrobial dosage:  Cefepime 1g IV q24h  Indication: bacteremia  Renal Function:  Estimated Creatinine Clearance: 40.8 mL/min (A) (by C-G formula based on SCr of 1.74 mg/dL (H)).    Antimicrobial dosage has been changed to:  Cefepime 2g IV q24h  Elicia Lamp, PharmD, BCPS Clinical Pharmacist 01/01/2018 8:44 AM

## 2018-01-01 NOTE — Progress Notes (Addendum)
[place] for heparin / warfarin Indication: atrial fibrillation  Patient Measurements: Height: 5\' 5"  (165.1 cm) Weight: 247 lb 5.7 oz (112.2 kg) IBW/kg (Calculated) : 57 Heparin Dosing Weight: 83.5 kg  Labs: Recent Labs    12/29/17 1126  12/30/17 0542 12/30/17 1205  12/31/17 0459 12/31/17 1321 12/31/17 2246 01/01/18 0545 01/01/18 0746  HGB 10.9*  --  9.8*  --   --  9.7*  --   --   --   --   HCT 33.4*  --  31.1*  --   --  29.5*  --   --   --   --   PLT 194  --  147*  --   --  139*  --   --   --   --   LABPROT 70.4*  --   --  16.9*  --  16.9*  --   --  16.5*  --   INR 8.61*  --   --  1.38  --  1.39  --   --  1.34  --   HEPARINUNFRC  --   --   --   --    < > <0.10* <0.10* 0.23*  --  0.33  CREATININE  --    < > 5.71*  --   --  3.01*  --   --  1.74*  --    < > = values in this interval not displayed.   Assessment: 67 yo female on chronic Coumadin for afib, held on admission for supratherapeutic INR. INR now < 2 after vitamin K given 2/5. Heparin level therapeutic at 0.33. She was admitted with hematuria due to self-cath but now resolved. CBC stable. May resume Coumadin soon per MD note.   Goal of Therapy:  Heparin level 0.3-0.7 units/ml Monitor platelets by anticoagulation protocol: Yes   Plan:  Continue IV heparin at 1850 units/hr Monitor daily heparin level and CBC, s/sx bleeding   Elicia Lamp, PharmD, BCPS Clinical Pharmacist Clinical phone for 01/01/2018 until 3:30pm: [phone] If after 3:30pm, please call main pharmacy at: [phone] 01/01/2018 9:40 AM   14:30 pm: Resuming warfarin at 6 mg po x 1 tonight Thank you. Anette Guarneri, PharmD

## 2018-01-01 NOTE — Progress Notes (Signed)
PROGRESS NOTE  Patricia Wagner XTK:[id number] DOB: Jun 26, 1951 DOA: 12/29/2017 PCP: Biagio Borg, MD  HPI/Recap of past 24 hours:  Patricia Wagner is a 67 y.o. year old female with medical history significant for atrial fibrillation on Coumadin, chronic systolic congestive heart failure, CAD, hypertension, hyperlipidemia, CKD stage III, previous CVA, COPD, reknot syndrome who presented on 12/29/2017 with weakness and slurred speech and was found to have AKI on CKD with anion gap metabolic acidosis concerning for obstructive uropathy, gram-negative bacteremia and supratherapeutic INR.   Subjective No events overnight. No complaints. Normal BMs. No nausea, fevers, chills or abdominal pain  Assessment/Plan: Principal Problem:   Acute renal failure superimposed on chronic kidney disease ([place]) Active Problems:   Essential hypertension   Paroxysmal atrial fibrillation (HCC)   Urinary retention   Anemia   Supratherapeutic INR   Acute UTI   Bacteremia due to Gram-negative bacteria   Increased anion gap metabolic acidosis  Enterobacter bacteremia Most likely secondary to suspected UTI,Sensitive to bactrim. Discussed with ID on phone curbside will switch from cefepime to more narrow coverage with bactrim. Will need to have antibiotics for total of 14 days.     AKI on CKD stage III with anion gap metabolic acidosis, improving Suspect most likely related to post renal since patient's creatinine improved with administration of Foley catheter upon admission.  Some concern for potential prerenal etiology given reports of decreased oral intake and possible emesis peak creatinine of 7.15 on admission, previous baseline of 1.2-1.4.  Very close to baseline. Continue to monitor on BMP  Obstructive uropathy, unclear etiology Relatively recent problem diagnosed during recent admission (12/08/17) imaging at that time of ( CT abdomen, renal ultrasound) were unrevealing.  Patient was started on Flomax at previous  hospitalization with plans to follow-up with urology as outpatient.  I wonder how much UTI contributes to this. Will still benefit from outpatient urology follow up for urodynamic studies  Chronic atrial fibrillation, rate controlled complicated by supratherapeutic INR, resolved Held home Coumadin in setting of supratherapeutic INR on admission (8.61), status post vitamin K now 1.38.  Continue home amiodarone.  Given prior history of stroke currently on heparin given subtherapeutic INR, will continue to monitor for any signs of bleeding (some clots in foley, will continue to monitor), Start coumadin today given resolution of hematuria   Anemia of chronic disease, stable Baseline hemoglobin 10.7, has been relatively stable. Hematuria has essentially resolved. No other signs of bleedingContinue to monitor on CBC  Chronic systolic heart failure, nonischemic cardiomyopathy,approaching euvolemia Holding home diuretics currently will continue to assess to assure no volume overload  Code Status: Full code  Family Communication: No family at bedside  Disposition Plan: Needs to have therapeutic INR,  Monitor on oral antibiotics   Consultants:  Nephrology  Procedures:  None  Antimicrobials:  Vancomycin 2/5  Ceftriaxone 2/5  Zosyn 2/5  Cefepime 2/6>>>2/8  Bactrim 2/8  Cultures:  Urine culture: 2/5: Citrobacter, blood culture: 2/5: 1 of 2  enterobacter; 2/6 no growth to date  DVT prophylaxis: SCDs   Objective: Vitals:   12/31/17 1816 12/31/17 2135 01/01/18 0633 01/01/18 1000  BP: 133/61 110/62 118/76 100/80  Pulse: 78 80 69 89  Resp: 18 19 20 20   Temp: 98.2 F (36.8 C) 98.8 F (37.1 C) 98.1 F (36.7 C) 98.3 F (36.8 C)  TempSrc: Oral Oral Oral Oral  SpO2: 92% 100% 100% 100%  Weight:  112.2 kg (247 lb 5.7 oz)    Height:  Intake/Output Summary (Last 24 hours) at 01/01/2018 1427 Last data filed at 01/01/2018 1100 Gross per 24 hour  Intake 1002.37 ml  Output 1150  ml  Net -147.63 ml   Filed Weights   12/29/17 2100 12/31/17 2135  Weight: 112 kg (246 lb 14.6 oz) 112.2 kg (247 lb 5.7 oz)    Exam:  General: Lying in bed, no distress, comfortable HEENT: moist oral mucosa Cardiovascular: Regular rate and rhythm, no peripheral edema Gastrointestinal: Soft, nondistended, nontender, decreased bowel sounds GU: Foley catheter in place draining yellow urine   Data Reviewed: CBC: Recent Labs  Lab 12/29/17 1126 12/30/17 0542 12/31/17 0459  WBC 15.6* 13.4* 11.3*  HGB 10.9* 9.8* 9.7*  HCT 33.4* 31.1* 29.5*  MCV 93.3 95.4 90.8  PLT 194 147* 998*   Basic Metabolic Panel: Recent Labs  Lab 12/29/17 1325 12/29/17 2231 12/30/17 0542 12/31/17 0459 01/01/18 0545  NA 135 137 139 140 143  K 4.2 3.9 4.0 3.4* 3.1*  CL 106 108 110 109 108  CO2 14* 13* 13* 18* 24  GLUCOSE 87 75 69 105* 105*  BUN 129* 122* 120* 82* 49*  CREATININE 7.15* 6.25* 5.71* 3.01* 1.74*  CALCIUM 8.3* 8.2* 8.2* 8.2* 8.2*  PHOS  --  4.5 5.0* 3.1 1.8*   GFR: Estimated Creatinine Clearance: 40.8 mL/min (A) (by C-G formula based on SCr of 1.74 mg/dL (H)). Liver Function Tests: Recent Labs  Lab 12/29/17 1156 12/29/17 2231 12/30/17 0542 12/31/17 0459 01/01/18 0545  AST 16  --   --   --   --   ALT 14  --   --   --   --   ALKPHOS 49  --   --   --   --   BILITOT 0.8  --   --   --   --   PROT 6.1*  --   --   --   --   ALBUMIN 2.6* 2.5* 2.3* 2.1* 2.1*   No results for input(s): LIPASE, AMYLASE in the last 168 hours. No results for input(s): AMMONIA in the last 168 hours. Coagulation Profile: Recent Labs  Lab 12/28/17 1446 12/29/17 1126 12/30/17 1205 12/31/17 0459 01/01/18 0545  INR >8 8.61* 1.38 1.39 1.34   Cardiac Enzymes: No results for input(s): CKTOTAL, CKMB, CKMBINDEX, TROPONINI in the last 168 hours. BNP (last 3 results) No results for input(s): PROBNP in the last 8760 hours. HbA1C: No results for input(s): HGBA1C in the last 72 hours. CBG: No results for  input(s): GLUCAP in the last 168 hours. Lipid Profile: No results for input(s): CHOL, HDL, LDLCALC, TRIG, CHOLHDL, LDLDIRECT in the last 72 hours. Thyroid Function Tests: No results for input(s): TSH, T4TOTAL, FREET4, T3FREE, THYROIDAB in the last 72 hours. Anemia Panel: No results for input(s): VITAMINB12, FOLATE, FERRITIN, TIBC, IRON, RETICCTPCT in the last 72 hours. Urine analysis:    Component Value Date/Time   COLORURINE AMBER (A) 12/29/2017 1311   APPEARANCEUR CLOUDY (A) 12/29/2017 1311   LABSPEC 1.010 12/29/2017 1311   PHURINE 8.0 12/29/2017 1311   GLUCOSEU NEGATIVE 12/29/2017 1311   GLUCOSEU NEGATIVE 11/10/2017 1216   HGBUR SMALL (A) 12/29/2017 1311   BILIRUBINUR NEGATIVE 12/29/2017 1311   KETONESUR NEGATIVE 12/29/2017 1311   PROTEINUR 100 (A) 12/29/2017 1311   UROBILINOGEN 0.2 11/10/2017 1216   NITRITE NEGATIVE 12/29/2017 1311   LEUKOCYTESUR LARGE (A) 12/29/2017 1311   Sepsis Labs: @LABRCNTIP (procalcitonin:4,lacticidven:4)  ) Recent Results (from the past 240 hour(s))  Urine Culture     Status:  Abnormal   Collection Time: 12/29/17  1:11 PM  Result Value Ref Range Status   Specimen Description URINE, CATHETERIZED  Final   Special Requests   Final    NONE Performed at [facility], [address], [place], [place] [address]    Culture >=100,000 COLONIES/mL CITROBACTER FREUNDII (A)  Final   Report Status 01/01/2018 FINAL  Final   Organism ID, Bacteria CITROBACTER FREUNDII (A)  Final      Susceptibility   Citrobacter freundii - MIC*    CEFAZOLIN RESISTANT Resistant     CEFTRIAXONE <=1 SENSITIVE Sensitive     CIPROFLOXACIN <=0.25 SENSITIVE Sensitive     GENTAMICIN <=1 SENSITIVE Sensitive     IMIPENEM <=0.25 SENSITIVE Sensitive     NITROFURANTOIN <=16 SENSITIVE Sensitive     TRIMETH/SULFA <=20 SENSITIVE Sensitive     PIP/TAZO <=4 SENSITIVE Sensitive     * >=100,000 COLONIES/mL CITROBACTER FREUNDII  Blood Culture (routine x 2)     Status: Abnormal  (Preliminary result)   Collection Time: 12/29/17  1:20 PM  Result Value Ref Range Status   Specimen Description LEFT ANTECUBITAL  Final   Special Requests   Final    BOTTLES DRAWN AEROBIC AND ANAEROBIC Blood Culture adequate volume   Culture  Setup Time   Final    GRAM NEGATIVE RODS IN BOTH AEROBIC AND ANAEROBIC BOTTLES CRITICAL RESULT CALLED TO, READ BACK BY AND VERIFIED WITH: A MASTERS PHARMD 12/30/17 4403 JDW Performed at [facility], [place] [address], [place], [place] [address]    Culture ENTEROBACTER CLOACAE (A)  Final   Report Status PENDING  Incomplete   Organism ID, Bacteria ENTEROBACTER CLOACAE  Final      Susceptibility   Enterobacter cloacae - MIC*    CEFAZOLIN >=64 RESISTANT Resistant     CEFEPIME <=1 SENSITIVE Sensitive     CEFTAZIDIME >=64 RESISTANT Resistant     CEFTRIAXONE 16 INTERMEDIATE Intermediate     CIPROFLOXACIN <=0.25 SENSITIVE Sensitive     GENTAMICIN <=1 SENSITIVE Sensitive     IMIPENEM <=0.25 SENSITIVE Sensitive     TRIMETH/SULFA <=20 SENSITIVE Sensitive     PIP/TAZO >=128 RESISTANT Resistant     * ENTEROBACTER CLOACAE  Blood Culture ID Panel (Reflexed)     Status: Abnormal   Collection Time: 12/29/17  1:20 PM  Result Value Ref Range Status   Enterococcus species NOT DETECTED NOT DETECTED Final   Listeria monocytogenes NOT DETECTED NOT DETECTED Final   Staphylococcus species NOT DETECTED NOT DETECTED Final   Staphylococcus aureus NOT DETECTED NOT DETECTED Final   Streptococcus species NOT DETECTED NOT DETECTED Final   Streptococcus agalactiae NOT DETECTED NOT DETECTED Final   Streptococcus pneumoniae NOT DETECTED NOT DETECTED Final   Streptococcus pyogenes NOT DETECTED NOT DETECTED Final   Acinetobacter baumannii NOT DETECTED NOT DETECTED Final   Enterobacteriaceae species DETECTED (A) NOT DETECTED Final    Comment: Enterobacteriaceae represent a large family of gram-negative bacteria, not a single organism. CRITICAL RESULT CALLED TO, READ BACK  BY AND VERIFIED WITH: A MASTERS PHARMD 12/30/17 0605 JDW    Enterobacter cloacae complex DETECTED (A) NOT DETECTED Final    Comment: CRITICAL RESULT CALLED TO, READ BACK BY AND VERIFIED WITH: A MASTERS PHARMD 12/30/17 0605 JDW    Escherichia coli NOT DETECTED NOT DETECTED Final   Klebsiella oxytoca NOT DETECTED NOT DETECTED Final   Klebsiella pneumoniae NOT DETECTED NOT DETECTED Final   Proteus species NOT DETECTED NOT DETECTED Final   Serratia  marcescens NOT DETECTED NOT DETECTED Final   Carbapenem resistance NOT DETECTED NOT DETECTED Final   Haemophilus influenzae NOT DETECTED NOT DETECTED Final   Neisseria meningitidis NOT DETECTED NOT DETECTED Final   Pseudomonas aeruginosa NOT DETECTED NOT DETECTED Final   Candida albicans NOT DETECTED NOT DETECTED Final   Candida glabrata NOT DETECTED NOT DETECTED Final   Candida krusei NOT DETECTED NOT DETECTED Final   Candida parapsilosis NOT DETECTED NOT DETECTED Final   Candida tropicalis NOT DETECTED NOT DETECTED Final  Blood Culture (routine x 2)     Status: None (Preliminary result)   Collection Time: 12/29/17  3:00 PM  Result Value Ref Range Status   Specimen Description BLOOD LEFT ARM  Final   Special Requests   Final    BOTTLES DRAWN AEROBIC AND ANAEROBIC Blood Culture adequate volume   Culture   Final    NO GROWTH 3 DAYS Performed at [facility], [place] [address], [place], [place] [address]    Report Status PENDING  Incomplete  Culture, blood (Routine X 2) w Reflex to ID Panel     Status: None (Preliminary result)   Collection Time: 12/30/17  2:45 PM  Result Value Ref Range Status   Specimen Description BLOOD LEFT HAND  Final   Special Requests   Final    BOTTLES DRAWN AEROBIC AND ANAEROBIC Blood Culture results may not be optimal due to an inadequate volume of blood received in culture bottles   Culture   Final    NO GROWTH 2 DAYS Performed at [facility], [place] [address] [place], [place] [address]    Report Status  PENDING  Incomplete  Culture, blood (Routine X 2) w Reflex to ID Panel     Status: None (Preliminary result)   Collection Time: 12/30/17  3:00 PM  Result Value Ref Range Status   Specimen Description BLOOD RIGHT HAND  Final   Special Requests   Final    BOTTLES DRAWN AEROBIC AND ANAEROBIC Blood Culture results may not be optimal due to an inadequate volume of blood received in culture bottles   Culture   Final    NO GROWTH 2 DAYS Performed at [facility], [place] [address], [place], [place] [address]    Report Status PENDING  Incomplete  C difficile quick scan w PCR reflex     Status: None   Collection Time: 12/31/17 10:30 AM  Result Value Ref Range Status   C Diff antigen NEGATIVE NEGATIVE Final   C Diff toxin NEGATIVE NEGATIVE Final   C Diff interpretation No C. difficile detected.  Final    Comment: Performed at [facility], [place] [address] [place], [place] [address]      Studies: No results found.  Scheduled Meds: . amiodarone  200 mg Oral Daily  . sodium bicarbonate  650 mg Oral BID  . warfarin  6 mg Oral ONCE-1800  . Warfarin - Pharmacist Dosing Inpatient   Does not apply q1800    Continuous Infusions: . heparin 1,850 Units/hr (01/01/18 0045)     LOS: 3 days     Desiree Hane, MD [facility] Pager [phone]  If 7PM-7AM, please contact night-coverage [email or web address] Password [email or web address] 01/01/2018, 2:27 PM

## 2018-01-02 LAB — CULTURE, BLOOD (ROUTINE X 2): SPECIAL REQUESTS: ADEQUATE

## 2018-01-02 LAB — BASIC METABOLIC PANEL
Anion gap: 10 (ref 5–15)
BUN: 31 mg/dL — ABNORMAL HIGH (ref 6–20)
CHLORIDE: 108 mmol/L (ref 101–111)
CO2: 25 mmol/L (ref 22–32)
Calcium: 8.1 mg/dL — ABNORMAL LOW (ref 8.9–10.3)
Creatinine, Ser: 1.53 mg/dL — ABNORMAL HIGH (ref 0.44–1.00)
GFR calc non Af Amer: 35 mL/min — ABNORMAL LOW (ref >60)
GFR, EST AFRICAN AMERICAN: 40 mL/min — AB (ref >60)
Glucose, Bld: 102 mg/dL — ABNORMAL HIGH (ref 65–99)
POTASSIUM: 3.3 mmol/L — AB (ref 3.5–5.1)
SODIUM: 143 mmol/L (ref 135–145)

## 2018-01-02 LAB — CBC
HEMATOCRIT: 30.2 % — AB (ref 36.0–46.0)
Hemoglobin: 9.6 g/dL — ABNORMAL LOW (ref 12.0–15.0)
MCH: 30.1 pg (ref 26.0–34.0)
MCHC: 31.8 g/dL (ref 30.0–36.0)
MCV: 94.7 fL (ref 78.0–100.0)
Platelets: 157 10*3/uL (ref 150–400)
RBC: 3.19 MIL/uL — AB (ref 3.87–5.11)
RDW: 15.4 % (ref 11.5–15.5)
WBC: 10.3 10*3/uL (ref 4.0–10.5)

## 2018-01-02 LAB — PROTIME-INR
INR: 1.23
Prothrombin Time: 15.4 seconds — ABNORMAL HIGH (ref 11.4–15.2)

## 2018-01-02 LAB — HEPARIN LEVEL (UNFRACTIONATED)
Heparin Unfractionated: <0.1 IU/mL — ABNORMAL LOW (ref 0.30–0.70)
Heparin Unfractionated: 0.17 IU/mL — ABNORMAL LOW (ref 0.30–0.70)

## 2018-01-02 MED ORDER — WARFARIN SODIUM 3 MG PO TABS
3.0000 mg | ORAL_TABLET | Freq: Once | ORAL | Status: AC
  Administered 2018-01-02: 3 mg via ORAL
  Filled 2018-01-02: qty 1

## 2018-01-02 MED ORDER — HEPARIN BOLUS VIA INFUSION
2000.0000 [IU] | Freq: Once | INTRAVENOUS | Status: AC
  Administered 2018-01-02: 2000 [IU] via INTRAVENOUS
  Filled 2018-01-02: qty 2000

## 2018-01-02 MED ORDER — HEPARIN BOLUS VIA INFUSION
2000.0000 [IU] | Freq: Once | INTRAVENOUS | Status: AC
Start: 2018-01-02 — End: 2018-01-02
  Administered 2018-01-02: 2000 [IU] via INTRAVENOUS
  Filled 2018-01-02: qty 2000

## 2018-01-02 NOTE — Progress Notes (Signed)
ANTICOAGULATION CONSULT NOTE - Follow Up Consult  Pharmacy Consult for heparin Indication: atrial fibrillation  Labs: Recent Labs    12/31/17 0459  12/31/17 2246 01/01/18 0545 01/01/18 0746 01/02/18 0416  HGB 9.7*  --   --   --   --  9.6*  HCT 29.5*  --   --   --   --  30.2*  PLT 139*  --   --   --   --  157  LABPROT 16.9*  --   --  16.5*  --  15.4*  INR 1.39  --   --  1.34  --  1.23  HEPARINUNFRC <0.10*   < > 0.23*  --  0.33 <0.10*  CREATININE 3.01*  --   --  1.74*  --   --    < > = values in this interval not displayed.    Assessment: 67yo female undetectable on heparin after one level at lower end of goal; RN reports no gtt issues overnight though pt would bend her arm and kink the line.  Goal of Therapy:  Heparin level 0.3-0.7 units/ml   Plan:  Will give small heparin bolus of 2000 units and increase heparin gtt by 2-3 units/kg/hr to 2100 units/hr and check level in 8 hours.    Wynona Neat, PharmD, BCPS  01/02/2018,6:34 AM

## 2018-01-02 NOTE — Progress Notes (Signed)
[place] for heparin / warfarin Indication: atrial fibrillation  Patient Measurements: Height: 5\' 5"  (165.1 cm) Weight: 224 lb (101.6 kg) IBW/kg (Calculated) : 57 Heparin Dosing Weight: 83.5 kg  Labs: Recent Labs    12/31/17 0459  12/31/17 2246 01/01/18 0545 01/01/18 0746 01/02/18 0416 01/02/18 0735  HGB 9.7*  --   --   --   --  9.6*  --   HCT 29.5*  --   --   --   --  30.2*  --   PLT 139*  --   --   --   --  157  --   LABPROT 16.9*  --   --  16.5*  --  15.4*  --   INR 1.39  --   --  1.34  --  1.23  --   HEPARINUNFRC <0.10*   < > 0.23*  --  0.33 <0.10*  --   CREATININE 3.01*  --   --  1.74*  --   --  1.53*   < > = values in this interval not displayed.   Assessment: 67 yo female on chronic Coumadin for afib, held on admission for supratherapeutic INR. INR now < 2 after vitamin K given 2/5. She was admitted with hematuria due to self-cath but now resolved. CBC low but stable and no bleeding reported at this time.  INR subtherapeutic at 1.23, coumadin restarted yesterday.  PTA dose is 3mg  daily and cipro has been started for treatment of enterobacter bacteremia for 2 weeks - may need lower requirements. Heparin level subtherapeutic at 0.17 on 2100 units/hr   Goal of Therapy:  INR 2-3 Heparin level 0.3-0.7 units/ml Monitor platelets by anticoagulation protocol: Yes   Plan:  Coumadin 3mg  PO x 1 tonight Give 2000 heparin bolus x1, and increase gtt to 2300 units/hr Monitor CBC, INR, heparin level, s/s bleeding F/u 2200 Heparin level  Bertis Ruddy, PharmD Pharmacy Resident Pager #: [phone] 01/02/2018 3:37 PM

## 2018-01-02 NOTE — Progress Notes (Signed)
PROGRESS NOTE  [name]:[id number] DOB: 21-Mar-1951 DOA: 12/29/2017 PCP: Biagio Borg, MD  HPI/Recap of past 24 hours:  Patricia Wagner is a 67 y.o. year old female with medical history significant for atrial fibrillation on Coumadin, chronic systolic congestive heart failure, CAD, hypertension, hyperlipidemia, CKD stage III, previous CVA, COPD, reknot syndrome who presented on 12/29/2017 with weakness and slurred speech and was found to have AKI on CKD with anion gap metabolic acidosis concerning for obstructive uropathy, gram-negative bacteremia and supratherapeutic INR.   Subjective No acute events overnight.  Continues to have normal BMs.  Denies any shortness of breath, chest pain, abdominal pain.  Assessment/Plan: Principal Problem:   Acute renal failure superimposed on chronic kidney disease ([place]) Active Problems:   Essential hypertension   Paroxysmal atrial fibrillation (HCC)   Urinary retention   Anemia   Supratherapeutic INR   Acute UTI   Bacteremia due to Gram-negative bacteria   Increased anion gap metabolic acidosis  Enterobacter bacteremia secondary to UTI, stable Transition from IV cefepime to Cipro on 2/8.  Repeat blood cultures have remained negative consistent with clearance .Will need to complete total 2 weeks of antibiotic therapy   AKI on CKD stage III with anion gap metabolic acidosis, improving Obstructive uropathy, unclear etiology Continues to improve rapidly in the setting of Foley catheter placement since admission.  Is consistent with most likely post renal obstruction potentially related to UTI; however, patient has had issues with possible obstructive bladder.  Patient will still need outpatient urologic evaluation for urodynamic studies once over acute illness.  Peak creatinine 7.15, previous baseline 1.2-1.4.  Currently creatinine 1.53 essentially back at baseline, we will continue to monitor appreciate nephrology recommendations.  Continue  Flomax   Chronic atrial fibrillation, rate controlled complicated by supratherapeutic INR, resolved Continue heparin bridge to Coumadin, follow pharmacy protocol.  Once INR reaches therapeutic range patient will be safe for discharge.  Given AKI on CKD unable to use Lovenox bridge. continue to monitor Held home Coumadin in setting of supratherapeutic INR on admission (8.61), status post vitamin K now 1.38.  Continue home amiodarone.    Anemia of chronic disease, stable Baseline hemoglobin 10.7, has been relatively stable. Hematuria has essentially resolved. No other signs of bleeding.Continue to monitor on CBC  Chronic systolic heart failure, nonischemic cardiomyopathy, euvolemic Holding home diuretics currently will continue to assess to assure no volume overload.  Most likely will resume home diuretics once discharged  Code Status: Full code  Family Communication: No family at bedside  Disposition Plan: Needs to have therapeutic INR,  Monitor on oral antibiotics   Consultants:  Nephrology  Procedures:  None  Antimicrobials:  Vancomycin 2/5  Ceftriaxone 2/5  Zosyn 2/5  Cefepime 2/6>>>2/8  Bactrim 2/8  Cultures:  Urine culture: 2/5: Citrobacter, blood culture: 2/5: 1 of 2  enterobacter; 2/6 no growth to date  DVT prophylaxis: SCDs   Objective: Vitals:   01/01/18 1703 01/01/18 2245 01/02/18 0558 01/02/18 1000  BP: (!) 96/57 98/64 104/61 110/68  Pulse: 78 65 65 74  Resp: 20 18 16 18   Temp: 98.2 F (36.8 C) 99 F (37.2 C) 98.3 F (36.8 C) 98.4 F (36.9 C)  TempSrc: Oral Oral Oral Oral  SpO2: 100% 100% 100% 100%  Weight:  101.6 kg (224 lb)    Height:        Intake/Output Summary (Last 24 hours) at 01/02/2018 1446 Last data filed at 01/02/2018 1300 Gross per 24 hour  Intake 1062 ml  Output 1600 ml  Net -538 ml   Filed Weights   12/29/17 2100 12/31/17 2135 01/01/18 2245  Weight: 112 kg (246 lb 14.6 oz) 112.2 kg (247 lb 5.7 oz) 101.6 kg (224 lb)     Exam:  General: Lying in bed, no distress, comfortable HEENT: moist oral mucosa Cardiovascular: Regular rate and rhythm, no peripheral edema Gastrointestinal: Soft, nondistended, nontender, decreased bowel sounds GU: Foley catheter in place draining yellow urine   Data Reviewed: CBC: Recent Labs  Lab 12/29/17 1126 12/30/17 0542 12/31/17 0459 01/02/18 0416  WBC 15.6* 13.4* 11.3* 10.3  HGB 10.9* 9.8* 9.7* 9.6*  HCT 33.4* 31.1* 29.5* 30.2*  MCV 93.3 95.4 90.8 94.7  PLT 194 147* 139* 892   Basic Metabolic Panel: Recent Labs  Lab 12/29/17 2231 12/30/17 0542 12/31/17 0459 01/01/18 0545 01/02/18 0735  NA 137 139 140 143 143  K 3.9 4.0 3.4* 3.1* 3.3*  CL 108 110 109 108 108  CO2 13* 13* 18* 24 25  GLUCOSE 75 69 105* 105* 102*  BUN 122* 120* 82* 49* 31*  CREATININE 6.25* 5.71* 3.01* 1.74* 1.53*  CALCIUM 8.2* 8.2* 8.2* 8.2* 8.1*  PHOS 4.5 5.0* 3.1 1.8*  --    GFR: Estimated Creatinine Clearance: 43.9 mL/min (A) (by C-G formula based on SCr of 1.53 mg/dL (H)). Liver Function Tests: Recent Labs  Lab 12/29/17 1156 12/29/17 2231 12/30/17 0542 12/31/17 0459 01/01/18 0545  AST 16  --   --   --   --   ALT 14  --   --   --   --   ALKPHOS 49  --   --   --   --   BILITOT 0.8  --   --   --   --   PROT 6.1*  --   --   --   --   ALBUMIN 2.6* 2.5* 2.3* 2.1* 2.1*   No results for input(s): LIPASE, AMYLASE in the last 168 hours. No results for input(s): AMMONIA in the last 168 hours. Coagulation Profile: Recent Labs  Lab 12/29/17 1126 12/30/17 1205 12/31/17 0459 01/01/18 0545 01/02/18 0416  INR 8.61* 1.38 1.39 1.34 1.23   Cardiac Enzymes: No results for input(s): CKTOTAL, CKMB, CKMBINDEX, TROPONINI in the last 168 hours. BNP (last 3 results) No results for input(s): PROBNP in the last 8760 hours. HbA1C: No results for input(s): HGBA1C in the last 72 hours. CBG: No results for input(s): GLUCAP in the last 168 hours. Lipid Profile: No results for input(s):  CHOL, HDL, LDLCALC, TRIG, CHOLHDL, LDLDIRECT in the last 72 hours. Thyroid Function Tests: No results for input(s): TSH, T4TOTAL, FREET4, T3FREE, THYROIDAB in the last 72 hours. Anemia Panel: No results for input(s): VITAMINB12, FOLATE, FERRITIN, TIBC, IRON, RETICCTPCT in the last 72 hours. Urine analysis:    Component Value Date/Time   COLORURINE AMBER (A) 12/29/2017 1311   APPEARANCEUR CLOUDY (A) 12/29/2017 1311   LABSPEC 1.010 12/29/2017 1311   PHURINE 8.0 12/29/2017 1311   GLUCOSEU NEGATIVE 12/29/2017 1311   GLUCOSEU NEGATIVE 11/10/2017 1216   HGBUR SMALL (A) 12/29/2017 1311   BILIRUBINUR NEGATIVE 12/29/2017 1311   KETONESUR NEGATIVE 12/29/2017 1311   PROTEINUR 100 (A) 12/29/2017 1311   UROBILINOGEN 0.2 11/10/2017 1216   NITRITE NEGATIVE 12/29/2017 1311   LEUKOCYTESUR LARGE (A) 12/29/2017 1311   Sepsis Labs: @LABRCNTIP (procalcitonin:4,lacticidven:4)  ) Recent Results (from the past 240 hour(s))  Urine Culture     Status: Abnormal   Collection Time: 12/29/17  1:11 PM  Result Value Ref Range Status   Specimen Description URINE, CATHETERIZED  Final   Special Requests   Final    NONE Performed at [facility], [address], [place], [place] [address]    Culture >=100,000 COLONIES/mL CITROBACTER FREUNDII (A)  Final   Report Status 01/01/2018 FINAL  Final   Organism ID, Bacteria CITROBACTER FREUNDII (A)  Final      Susceptibility   Citrobacter freundii - MIC*    CEFAZOLIN RESISTANT Resistant     CEFTRIAXONE <=1 SENSITIVE Sensitive     CIPROFLOXACIN <=0.25 SENSITIVE Sensitive     GENTAMICIN <=1 SENSITIVE Sensitive     IMIPENEM <=0.25 SENSITIVE Sensitive     NITROFURANTOIN <=16 SENSITIVE Sensitive     TRIMETH/SULFA <=20 SENSITIVE Sensitive     PIP/TAZO <=4 SENSITIVE Sensitive     * >=100,000 COLONIES/mL CITROBACTER FREUNDII  Blood Culture (routine x 2)     Status: Abnormal   Collection Time: 12/29/17  1:20 PM  Result Value Ref Range Status   Specimen  Description LEFT ANTECUBITAL  Final   Special Requests   Final    BOTTLES DRAWN AEROBIC AND ANAEROBIC Blood Culture adequate volume   Culture  Setup Time   Final    GRAM NEGATIVE RODS IN BOTH AEROBIC AND ANAEROBIC BOTTLES CRITICAL RESULT CALLED TO, READ BACK BY AND VERIFIED WITH: A MASTERS PHARMD 12/30/17 2426 JDW Performed at [facility], [place] [address] [place], [place] [address]    Culture ENTEROBACTER CLOACAE (A)  Final   Report Status 01/02/2018 FINAL  Final   Organism ID, Bacteria ENTEROBACTER CLOACAE  Final      Susceptibility   Enterobacter cloacae - MIC*    CEFAZOLIN >=64 RESISTANT Resistant     CEFEPIME <=1 SENSITIVE Sensitive     CEFTAZIDIME >=64 RESISTANT Resistant     CEFTRIAXONE 16 INTERMEDIATE Intermediate     CIPROFLOXACIN <=0.25 SENSITIVE Sensitive     GENTAMICIN <=1 SENSITIVE Sensitive     IMIPENEM <=0.25 SENSITIVE Sensitive     TRIMETH/SULFA <=20 SENSITIVE Sensitive     PIP/TAZO >=128 RESISTANT Resistant     * ENTEROBACTER CLOACAE  Blood Culture ID Panel (Reflexed)     Status: Abnormal   Collection Time: 12/29/17  1:20 PM  Result Value Ref Range Status   Enterococcus species NOT DETECTED NOT DETECTED Final   Listeria monocytogenes NOT DETECTED NOT DETECTED Final   Staphylococcus species NOT DETECTED NOT DETECTED Final   Staphylococcus aureus NOT DETECTED NOT DETECTED Final   Streptococcus species NOT DETECTED NOT DETECTED Final   Streptococcus agalactiae NOT DETECTED NOT DETECTED Final   Streptococcus pneumoniae NOT DETECTED NOT DETECTED Final   Streptococcus pyogenes NOT DETECTED NOT DETECTED Final   Acinetobacter baumannii NOT DETECTED NOT DETECTED Final   Enterobacteriaceae species DETECTED (A) NOT DETECTED Final    Comment: Enterobacteriaceae represent a large family of gram-negative bacteria, not a single organism. CRITICAL RESULT CALLED TO, READ BACK BY AND VERIFIED WITH: A MASTERS PHARMD 12/30/17 0605 JDW    Enterobacter cloacae complex DETECTED  (A) NOT DETECTED Final    Comment: CRITICAL RESULT CALLED TO, READ BACK BY AND VERIFIED WITH: A MASTERS PHARMD 12/30/17 0605 JDW    Escherichia coli NOT DETECTED NOT DETECTED Final   Klebsiella oxytoca NOT DETECTED NOT DETECTED Final   Klebsiella pneumoniae NOT DETECTED NOT DETECTED Final   Proteus species NOT DETECTED NOT DETECTED Final   Serratia marcescens NOT DETECTED NOT DETECTED Final   Carbapenem resistance NOT  DETECTED NOT DETECTED Final   Haemophilus influenzae NOT DETECTED NOT DETECTED Final   Neisseria meningitidis NOT DETECTED NOT DETECTED Final   Pseudomonas aeruginosa NOT DETECTED NOT DETECTED Final   Candida albicans NOT DETECTED NOT DETECTED Final   Candida glabrata NOT DETECTED NOT DETECTED Final   Candida krusei NOT DETECTED NOT DETECTED Final   Candida parapsilosis NOT DETECTED NOT DETECTED Final   Candida tropicalis NOT DETECTED NOT DETECTED Final  Blood Culture (routine x 2)     Status: None (Preliminary result)   Collection Time: 12/29/17  3:00 PM  Result Value Ref Range Status   Specimen Description BLOOD LEFT ARM  Final   Special Requests   Final    BOTTLES DRAWN AEROBIC AND ANAEROBIC Blood Culture adequate volume   Culture   Final    NO GROWTH 4 DAYS Performed at [facility], [address] [place], [place] [address]    Report Status PENDING  Incomplete  Culture, blood (Routine X 2) w Reflex to ID Panel     Status: None (Preliminary result)   Collection Time: 12/30/17  2:45 PM  Result Value Ref Range Status   Specimen Description BLOOD LEFT HAND  Final   Special Requests   Final    BOTTLES DRAWN AEROBIC AND ANAEROBIC Blood Culture results may not be optimal due to an inadequate volume of blood received in culture bottles   Culture   Final    NO GROWTH 3 DAYS Performed at [facility], [place] [address] [place], [place] [address]    Report Status PENDING  Incomplete  Culture, blood (Routine X 2) w Reflex to ID Panel     Status: None  (Preliminary result)   Collection Time: 12/30/17  3:00 PM  Result Value Ref Range Status   Specimen Description BLOOD RIGHT HAND  Final   Special Requests   Final    BOTTLES DRAWN AEROBIC AND ANAEROBIC Blood Culture results may not be optimal due to an inadequate volume of blood received in culture bottles   Culture   Final    NO GROWTH 3 DAYS Performed at [facility], [place] [address], [place], [place] [address]    Report Status PENDING  Incomplete  C difficile quick scan w PCR reflex     Status: None   Collection Time: 12/31/17 10:30 AM  Result Value Ref Range Status   C Diff antigen NEGATIVE NEGATIVE Final   C Diff toxin NEGATIVE NEGATIVE Final   C Diff interpretation No C. difficile detected.  Final    Comment: Performed at [facility], [place] [address], [place], [place] [address]      Studies: No results found.  Scheduled Meds: . amiodarone  200 mg Oral Daily  . ciprofloxacin  500 mg Oral BID  . sodium bicarbonate  650 mg Oral BID  . Warfarin - Pharmacist Dosing Inpatient   Does not apply q1800    Continuous Infusions: . heparin 2,100 Units/hr (01/02/18 8676)     LOS: 4 days     Desiree Hane, MD [facility] Pager [phone]  If 7PM-7AM, please contact night-coverage [email or web address] Password [email or web address] 01/02/2018, 2:46 PM

## 2018-01-03 LAB — CBC
HCT: 32.2 % — ABNORMAL LOW (ref 36.0–46.0)
Hemoglobin: 10.1 g/dL — ABNORMAL LOW (ref 12.0–15.0)
MCH: 30.4 pg (ref 26.0–34.0)
MCHC: 31.4 g/dL (ref 30.0–36.0)
MCV: 97 fL (ref 78.0–100.0)
PLATELETS: 181 10*3/uL (ref 150–400)
RBC: 3.32 MIL/uL — AB (ref 3.87–5.11)
RDW: 15.9 % — ABNORMAL HIGH (ref 11.5–15.5)
WBC: 11.3 10*3/uL — ABNORMAL HIGH (ref 4.0–10.5)

## 2018-01-03 LAB — CULTURE, BLOOD (ROUTINE X 2)
CULTURE: NO GROWTH
SPECIAL REQUESTS: ADEQUATE

## 2018-01-03 LAB — BASIC METABOLIC PANEL
Anion gap: 12 (ref 5–15)
BUN: 17 mg/dL (ref 6–20)
CO2: 26 mmol/L (ref 22–32)
CREATININE: 1.34 mg/dL — AB (ref 0.44–1.00)
Calcium: 8.5 mg/dL — ABNORMAL LOW (ref 8.9–10.3)
Chloride: 105 mmol/L (ref 101–111)
GFR calc non Af Amer: 41 mL/min — ABNORMAL LOW (ref >60)
GFR, EST AFRICAN AMERICAN: 47 mL/min — AB (ref >60)
GLUCOSE: 110 mg/dL — AB (ref 65–99)
Potassium: 3.2 mmol/L — ABNORMAL LOW (ref 3.5–5.1)
Sodium: 143 mmol/L (ref 135–145)

## 2018-01-03 LAB — HEPARIN LEVEL (UNFRACTIONATED)
Heparin Unfractionated: 1 IU/mL — ABNORMAL HIGH (ref 0.30–0.70)
Heparin Unfractionated: 0.57 IU/mL (ref 0.30–0.70)

## 2018-01-03 LAB — PROTIME-INR
INR: 1.35
PROTHROMBIN TIME: 16.6 s — AB (ref 11.4–15.2)

## 2018-01-03 MED ORDER — CARVEDILOL 3.125 MG PO TABS
3.1250 mg | ORAL_TABLET | Freq: Two times a day (BID) | ORAL | Status: DC
  Administered 2018-01-03 – 2018-01-08 (×9): 3.125 mg via ORAL
  Filled 2018-01-03 (×10): qty 1

## 2018-01-03 MED ORDER — FAMOTIDINE 20 MG PO TABS
20.0000 mg | ORAL_TABLET | Freq: Every day | ORAL | Status: DC
  Administered 2018-01-03 – 2018-01-05 (×3): 20 mg via ORAL
  Filled 2018-01-03 (×5): qty 1

## 2018-01-03 MED ORDER — TAMSULOSIN HCL 0.4 MG PO CAPS
0.4000 mg | ORAL_CAPSULE | Freq: Every day | ORAL | Status: DC
  Administered 2018-01-03 – 2018-01-08 (×6): 0.4 mg via ORAL
  Filled 2018-01-03 (×6): qty 1

## 2018-01-03 MED ORDER — RANOLAZINE ER 500 MG PO TB12
500.0000 mg | ORAL_TABLET | Freq: Two times a day (BID) | ORAL | Status: DC
Start: 2018-01-03 — End: 2018-01-08
  Administered 2018-01-03 – 2018-01-08 (×9): 500 mg via ORAL
  Filled 2018-01-03 (×10): qty 1

## 2018-01-03 MED ORDER — WARFARIN SODIUM 3 MG PO TABS
3.0000 mg | ORAL_TABLET | Freq: Once | ORAL | Status: AC
  Administered 2018-01-03: 3 mg via ORAL
  Filled 2018-01-03: qty 1

## 2018-01-03 MED ORDER — ISOSORB DINITRATE-HYDRALAZINE 20-37.5 MG PO TABS
1.0000 | ORAL_TABLET | Freq: Three times a day (TID) | ORAL | Status: DC
  Administered 2018-01-04 – 2018-01-08 (×13): 1 via ORAL
  Filled 2018-01-03 (×14): qty 1

## 2018-01-03 NOTE — Progress Notes (Signed)
[place] for heparin / warfarin Indication: atrial fibrillation  Patient Measurements: Height: 5\' 5"  (165.1 cm) Weight: 224 lb 6.4 oz (101.8 kg) IBW/kg (Calculated) : 57 Heparin Dosing Weight: 83.5 kg  Labs: Recent Labs    01/01/18 0545  01/02/18 0416 01/02/18 0735 01/02/18 1422 01/03/18 0820  HGB  --   --  9.6*  --   --  10.1*  HCT  --   --  30.2*  --   --  32.2*  PLT  --   --  157  --   --  181  LABPROT 16.5*  --  15.4*  --   --  16.6*  INR 1.34  --  1.23  --   --  1.35  HEPARINUNFRC  --    < > <0.10*  --  0.17* 1.00*  CREATININE 1.74*  --   --  1.53*  --   --    < > = values in this interval not displayed.   Assessment: 67 yo female on chronic Coumadin for afib, held on admission for supratherapeutic INR. INR now < 2 after vitamin K given 2/5. She was admitted with hematuria due to self-cath but now resolved. CBC low but stable and no bleeding reported at this time.  INR subtherapeutic but increased to 1.35 after coumadin restart 2/8.  PTA dose is 3mg  daily and cipro has been started for treatment of enterobacter bacteremia for 2 weeks - which may result in lower coumadin requirements while on therapy. IV access was lost ~2030 on 2/9 and heparin was restarted about 2130. Heparin level this AM was supratherapeutic at 1.0.  Goal of Therapy:  INR 2-3 Heparin level 0.3-0.7 units/ml Monitor platelets by anticoagulation protocol: Yes   Plan:  Coumadin 3mg  PO x 1 tonight Hold Heparin gtt for 1 hour, then restart at 2100 units/hr Recheck heparin level at 1700 Daily HL, CBC, monitor for s/sx of bleeding   Patterson Hammersmith PharmD PGY1 Pharmacy Practice Resident 01/03/2018 9:38 AM Pager: [phone] Phone: [phone]

## 2018-01-03 NOTE — Progress Notes (Signed)
[place] for heparin / warfarin Indication: atrial fibrillation  Patient Measurements: Height: 5\' 5"  (165.1 cm) Weight: 224 lb 6.4 oz (101.8 kg) IBW/kg (Calculated) : 57 Heparin Dosing Weight: 83.5 kg  Labs: Recent Labs    01/01/18 0545  01/02/18 0416 01/02/18 0735 01/02/18 1422 01/03/18 0820 01/03/18 1411 01/03/18 1622  HGB  --   --  9.6*  --   --  10.1*  --   --   HCT  --   --  30.2*  --   --  32.2*  --   --   PLT  --   --  157  --   --  181  --   --   LABPROT 16.5*  --  15.4*  --   --  16.6*  --   --   INR 1.34  --  1.23  --   --  1.35  --   --   HEPARINUNFRC  --    < > <0.10*  --  0.17* 1.00*  --  0.57  CREATININE 1.74*  --   --  1.53*  --   --  1.34*  --    < > = values in this interval not displayed.   Assessment: 67 yo female on chronic Coumadin for afib, held on admission for supratherapeutic INR. INR now < 2 after vitamin K given 2/5. She was admitted with hematuria due to self-cath but now resolved. CBC low but stable and no bleeding reported at this time.  Heparin level is now therapeutic after most rate adjustment to 2100 units/hr. No issues infusion.   Goal of Therapy:  INR 2-3 Heparin level 0.3-0.7 units/ml Monitor platelets by anticoagulation protocol: Yes   Plan:  1. Continue heparin infusion at 2100 units/hr 2. Recheck heparin level in am   Vincenza Hews, PharmD, BCPS 01/03/2018, 5:19 PM

## 2018-01-03 NOTE — Progress Notes (Signed)
[name]:[id number] DOB: 1951/11/15 DOA: 12/29/2017 PCP: Biagio Borg, MD  HPI/Recap of past 24 hours:  Patricia Wagner is a 67 y.o. year old female with medical history significant for atrial fibrillation on Coumadin, chronic systolic congestive heart failure, CAD, hypertension, hyperlipidemia, CKD stage III, previous CVA, COPD, reknot syndrome who presented on 12/29/2017 with weakness and slurred speech and was found to have AKI on CKD with anion gap metabolic acidosis concerning for obstructive uropathy, gram-negative bacteremia and supratherapeutic INR.   Subjective Feeling fine.  Written patient information on urinary tract infection.  Had many questions concerning her blood pressure medications and need to continue on discharge.  Assessment/Plan: Principal Problem:   Acute renal failure superimposed on chronic kidney disease ([place]) Active Problems:   Essential hypertension   Paroxysmal atrial fibrillation (HCC)   Urinary retention   Anemia   Supratherapeutic INR   Acute UTI   Bacteremia due to Gram-negative bacteria   Increased anion gap metabolic acidosis  Enterobacter bacteremia secondary to UTI, stable Transition from IV cefepime to Cipro on 2/8.  Repeat blood cultures have remained negative consistent with clearance .Will need to complete total 2 weeks of antibiotic therapy   AKI on CKD stage III with anion gap metabolic acidosis, resolved Obstructive uropathy, unclear etiology Creatinine has returned back to baseline.  Suspect kidney function was multifactorial etiology related to acute urinary retention and UTI.  Patient will still need outpatient urologic evaluation for urodynamic studies once over acute illness.  Peak creatinine 7.15,  baseline 1.2-1.4.   Continue Flomax  Chronic atrial fibrillation, rate controlled complicated by supratherapeutic INR, resolved INR still subtherapeutic.  Continue heparin bridge to Coumadin, follow pharmacy protocol.   Once INR reaches therapeutic range patient will be safe for discharge.  Given AKI on CKD unable to use Lovenox bridge. continue to monitor Continue home amiodarone.    Anemia of chronic disease, stable Baseline hemoglobin 10.7, has been relatively stable. Hematuria has essentially resolved. No other signs of bleeding.Continue to monitor on CBC  Chronic systolic heart failure, nonischemic cardiomyopathy, euvolemic Holding home diuretics currently will continue to assess to assure no volume overload.  Will re-resume carvedilol and ranexa.  Continue holding Entresto and spironolactone while monitoring kidney function.  Plan resuming BiDil on 2/11.  Code Status: Full code  Family Communication: No family at bedside  Disposition Plan: Needs to have therapeutic INR,  Monitor on oral antibiotics   Consultants:  Nephrology  Procedures:  None  Antimicrobials:  Vancomycin 2/5  Ceftriaxone 2/5  Zosyn 2/5  Cefepime 2/6>>>2/8  Bactrim 2/8  Cultures:  Urine culture: 2/5: Citrobacter, blood culture: 2/5: 1 of 2  enterobacter; 2/6 no growth to date  DVT prophylaxis: SCDs   Objective: Vitals:   01/02/18 1700 01/02/18 2147 01/03/18 0529 01/03/18 0924  BP: 102/60 133/64 (!) 123/51 98/86  Pulse: 76 64 68 68  Resp: 18 18 18 18   Temp: 98 F (36.7 C) 98.6 F (37 C) 98.6 F (37 C) 98.6 F (37 C)  TempSrc: Oral Oral Oral Oral  SpO2: 99% 99% 100% 99%  Weight:  101.8 kg (224 lb 6.4 oz)    Height:        Intake/Output Summary (Last 24 hours) at 01/03/2018 1533 Last data filed at 01/03/2018 1300 Gross per 24 hour  Intake 1398.79 ml  Output 3025 ml  Net -1626.21 ml   Filed Weights   12/31/17 2135 01/01/18 2245 01/02/18 2147  Weight: 112.2 kg (247  lb 5.7 oz) 101.6 kg (224 lb) 101.8 kg (224 lb 6.4 oz)    Exam:  General: Lying in bed, no distress, comfortable HEENT: moist oral mucosa Cardiovascular: Regular rate and rhythm, no peripheral edema Gastrointestinal: Soft,  nondistended, nontender, decreased bowel sounds GU: Foley catheter in place draining yellow urine   Data Reviewed: CBC: Recent Labs  Lab 12/29/17 1126 12/30/17 0542 12/31/17 0459 01/02/18 0416 01/03/18 0820  WBC 15.6* 13.4* 11.3* 10.3 11.3*  HGB 10.9* 9.8* 9.7* 9.6* 10.1*  HCT 33.4* 31.1* 29.5* 30.2* 32.2*  MCV 93.3 95.4 90.8 94.7 97.0  PLT 194 147* 139* 157 867   Basic Metabolic Panel: Recent Labs  Lab 12/29/17 2231 12/30/17 0542 12/31/17 0459 01/01/18 0545 01/02/18 0735 01/03/18 1411  NA 137 139 140 143 143 143  K 3.9 4.0 3.4* 3.1* 3.3* 3.2*  CL 108 110 109 108 108 105  CO2 13* 13* 18* 24 25 26   GLUCOSE 75 69 105* 105* 102* 110*  BUN 122* 120* 82* 49* 31* 17  CREATININE 6.25* 5.71* 3.01* 1.74* 1.53* 1.34*  CALCIUM 8.2* 8.2* 8.2* 8.2* 8.1* 8.5*  PHOS 4.5 5.0* 3.1 1.8*  --   --    GFR: Estimated Creatinine Clearance: 50.2 mL/min (A) (by C-G formula based on SCr of 1.34 mg/dL (H)). Liver Function Tests: Recent Labs  Lab 12/29/17 1156 12/29/17 2231 12/30/17 0542 12/31/17 0459 01/01/18 0545  AST 16  --   --   --   --   ALT 14  --   --   --   --   ALKPHOS 49  --   --   --   --   BILITOT 0.8  --   --   --   --   PROT 6.1*  --   --   --   --   ALBUMIN 2.6* 2.5* 2.3* 2.1* 2.1*   No results for input(s): LIPASE, AMYLASE in the last 168 hours. No results for input(s): AMMONIA in the last 168 hours. Coagulation Profile: Recent Labs  Lab 12/30/17 1205 12/31/17 0459 01/01/18 0545 01/02/18 0416 01/03/18 0820  INR 1.38 1.39 1.34 1.23 1.35   Cardiac Enzymes: No results for input(s): CKTOTAL, CKMB, CKMBINDEX, TROPONINI in the last 168 hours. BNP (last 3 results) No results for input(s): PROBNP in the last 8760 hours. HbA1C: No results for input(s): HGBA1C in the last 72 hours. CBG: No results for input(s): GLUCAP in the last 168 hours. Lipid Profile: No results for input(s): CHOL, HDL, LDLCALC, TRIG, CHOLHDL, LDLDIRECT in the last 72 hours. Thyroid  Function Tests: No results for input(s): TSH, T4TOTAL, FREET4, T3FREE, THYROIDAB in the last 72 hours. Anemia Panel: No results for input(s): VITAMINB12, FOLATE, FERRITIN, TIBC, IRON, RETICCTPCT in the last 72 hours. Urine analysis:    Component Value Date/Time   COLORURINE AMBER (A) 12/29/2017 1311   APPEARANCEUR CLOUDY (A) 12/29/2017 1311   LABSPEC 1.010 12/29/2017 1311   PHURINE 8.0 12/29/2017 1311   GLUCOSEU NEGATIVE 12/29/2017 1311   GLUCOSEU NEGATIVE 11/10/2017 1216   HGBUR SMALL (A) 12/29/2017 1311   BILIRUBINUR NEGATIVE 12/29/2017 1311   KETONESUR NEGATIVE 12/29/2017 1311   PROTEINUR 100 (A) 12/29/2017 1311   UROBILINOGEN 0.2 11/10/2017 1216   NITRITE NEGATIVE 12/29/2017 1311   LEUKOCYTESUR LARGE (A) 12/29/2017 1311   Sepsis Labs: @LABRCNTIP (procalcitonin:4,lacticidven:4)  ) Recent Results (from the past 240 hour(s))  Urine Culture     Status: Abnormal   Collection Time: 12/29/17  1:11 PM  Result Value Ref Range  Status   Specimen Description URINE, CATHETERIZED  Final   Special Requests   Final    NONE Performed at [facility], [place] [address] [place], [place] [address]    Culture >=100,000 COLONIES/mL CITROBACTER FREUNDII (A)  Final   Report Status 01/01/2018 FINAL  Final   Organism ID, Bacteria CITROBACTER FREUNDII (A)  Final      Susceptibility   Citrobacter freundii - MIC*    CEFAZOLIN RESISTANT Resistant     CEFTRIAXONE <=1 SENSITIVE Sensitive     CIPROFLOXACIN <=0.25 SENSITIVE Sensitive     GENTAMICIN <=1 SENSITIVE Sensitive     IMIPENEM <=0.25 SENSITIVE Sensitive     NITROFURANTOIN <=16 SENSITIVE Sensitive     TRIMETH/SULFA <=20 SENSITIVE Sensitive     PIP/TAZO <=4 SENSITIVE Sensitive     * >=100,000 COLONIES/mL CITROBACTER FREUNDII  Blood Culture (routine x 2)     Status: Abnormal   Collection Time: 12/29/17  1:20 PM  Result Value Ref Range Status   Specimen Description LEFT ANTECUBITAL  Final   Special Requests   Final    BOTTLES DRAWN  AEROBIC AND ANAEROBIC Blood Culture adequate volume   Culture  Setup Time   Final    GRAM NEGATIVE RODS IN BOTH AEROBIC AND ANAEROBIC BOTTLES CRITICAL RESULT CALLED TO, READ BACK BY AND VERIFIED WITH: A MASTERS PHARMD 12/30/17 7622 JDW Performed at [facility], [place] [address], [place], [place] [address]    Culture ENTEROBACTER CLOACAE (A)  Final   Report Status 01/02/2018 FINAL  Final   Organism ID, Bacteria ENTEROBACTER CLOACAE  Final      Susceptibility   Enterobacter cloacae - MIC*    CEFAZOLIN >=64 RESISTANT Resistant     CEFEPIME <=1 SENSITIVE Sensitive     CEFTAZIDIME >=64 RESISTANT Resistant     CEFTRIAXONE 16 INTERMEDIATE Intermediate     CIPROFLOXACIN <=0.25 SENSITIVE Sensitive     GENTAMICIN <=1 SENSITIVE Sensitive     IMIPENEM <=0.25 SENSITIVE Sensitive     TRIMETH/SULFA <=20 SENSITIVE Sensitive     PIP/TAZO >=128 RESISTANT Resistant     * ENTEROBACTER CLOACAE  Blood Culture ID Panel (Reflexed)     Status: Abnormal   Collection Time: 12/29/17  1:20 PM  Result Value Ref Range Status   Enterococcus species NOT DETECTED NOT DETECTED Final   Listeria monocytogenes NOT DETECTED NOT DETECTED Final   Staphylococcus species NOT DETECTED NOT DETECTED Final   Staphylococcus aureus NOT DETECTED NOT DETECTED Final   Streptococcus species NOT DETECTED NOT DETECTED Final   Streptococcus agalactiae NOT DETECTED NOT DETECTED Final   Streptococcus pneumoniae NOT DETECTED NOT DETECTED Final   Streptococcus pyogenes NOT DETECTED NOT DETECTED Final   Acinetobacter baumannii NOT DETECTED NOT DETECTED Final   Enterobacteriaceae species DETECTED (A) NOT DETECTED Final    Comment: Enterobacteriaceae represent a large family of gram-negative bacteria, not a single organism. CRITICAL RESULT CALLED TO, READ BACK BY AND VERIFIED WITH: A MASTERS PHARMD 12/30/17 0605 JDW    Enterobacter cloacae complex DETECTED (A) NOT DETECTED Final    Comment: CRITICAL RESULT CALLED TO, READ BACK BY AND  VERIFIED WITH: A MASTERS PHARMD 12/30/17 0605 JDW    Escherichia coli NOT DETECTED NOT DETECTED Final   Klebsiella oxytoca NOT DETECTED NOT DETECTED Final   Klebsiella pneumoniae NOT DETECTED NOT DETECTED Final   Proteus species NOT DETECTED NOT DETECTED Final   Serratia marcescens NOT DETECTED NOT DETECTED Final   Carbapenem resistance NOT DETECTED NOT DETECTED Final  Haemophilus influenzae NOT DETECTED NOT DETECTED Final   Neisseria meningitidis NOT DETECTED NOT DETECTED Final   Pseudomonas aeruginosa NOT DETECTED NOT DETECTED Final   Candida albicans NOT DETECTED NOT DETECTED Final   Candida glabrata NOT DETECTED NOT DETECTED Final   Candida krusei NOT DETECTED NOT DETECTED Final   Candida parapsilosis NOT DETECTED NOT DETECTED Final   Candida tropicalis NOT DETECTED NOT DETECTED Final  Blood Culture (routine x 2)     Status: None   Collection Time: 12/29/17  3:00 PM  Result Value Ref Range Status   Specimen Description BLOOD LEFT ARM  Final   Special Requests   Final    BOTTLES DRAWN AEROBIC AND ANAEROBIC Blood Culture adequate volume   Culture   Final    NO GROWTH 5 DAYS Performed at [facility], [place] [address], [place], [place] [address]    Report Status 01/03/2018 FINAL  Final  Culture, blood (Routine X 2) w Reflex to ID Panel     Status: None (Preliminary result)   Collection Time: 12/30/17  2:45 PM  Result Value Ref Range Status   Specimen Description BLOOD LEFT HAND  Final   Special Requests   Final    BOTTLES DRAWN AEROBIC AND ANAEROBIC Blood Culture results may not be optimal due to an inadequate volume of blood received in culture bottles   Culture   Final    NO GROWTH 4 DAYS Performed at [facility], [place] [address] [place], [place] [address]    Report Status PENDING  Incomplete  Culture, blood (Routine X 2) w Reflex to ID Panel     Status: None (Preliminary result)   Collection Time: 12/30/17  3:00 PM  Result Value Ref Range Status   Specimen  Description BLOOD RIGHT HAND  Final   Special Requests   Final    BOTTLES DRAWN AEROBIC AND ANAEROBIC Blood Culture results may not be optimal due to an inadequate volume of blood received in culture bottles   Culture   Final    NO GROWTH 4 DAYS Performed at [facility], [place] [address], [place], [place] [address]    Report Status PENDING  Incomplete  C difficile quick scan w PCR reflex     Status: None   Collection Time: 12/31/17 10:30 AM  Result Value Ref Range Status   C Diff antigen NEGATIVE NEGATIVE Final   C Diff toxin NEGATIVE NEGATIVE Final   C Diff interpretation No C. difficile detected.  Final    Comment: Performed at [facility], [place] [address], [place], [place] [address]      Studies: No results found.  Scheduled Meds: . amiodarone  200 mg Oral Daily  . ciprofloxacin  500 mg Oral BID  . sodium bicarbonate  650 mg Oral BID  . warfarin  3 mg Oral ONCE-1800  . Warfarin - Pharmacist Dosing Inpatient   Does not apply q1800    Continuous Infusions: . heparin 2,100 Units/hr (01/03/18 1043)     LOS: 5 days     Desiree Hane, MD [facility] Pager [phone]  If 7PM-7AM, please contact night-coverage [email or web address] Password [facility] 01/03/2018, 3:33 PM

## 2018-01-04 LAB — CBC
HEMATOCRIT: 29.6 % — AB (ref 36.0–46.0)
HEMOGLOBIN: 9.1 g/dL — AB (ref 12.0–15.0)
MCH: 29.7 pg (ref 26.0–34.0)
MCHC: 30.7 g/dL (ref 30.0–36.0)
MCV: 96.7 fL (ref 78.0–100.0)
PLATELETS: 190 10*3/uL (ref 150–400)
RBC: 3.06 MIL/uL — AB (ref 3.87–5.11)
RDW: 15.7 % — ABNORMAL HIGH (ref 11.5–15.5)
WBC: 14.3 10*3/uL — AB (ref 4.0–10.5)

## 2018-01-04 LAB — CULTURE, BLOOD (ROUTINE X 2)
CULTURE: NO GROWTH
Culture: NO GROWTH

## 2018-01-04 LAB — BASIC METABOLIC PANEL
ANION GAP: 11 (ref 5–15)
BUN: 12 mg/dL (ref 6–20)
CALCIUM: 8.1 mg/dL — AB (ref 8.9–10.3)
CO2: 22 mmol/L (ref 22–32)
Chloride: 108 mmol/L (ref 101–111)
Creatinine, Ser: 1.2 mg/dL — ABNORMAL HIGH (ref 0.44–1.00)
GFR calc Af Amer: 54 mL/min — ABNORMAL LOW (ref >60)
GFR calc non Af Amer: 47 mL/min — ABNORMAL LOW (ref >60)
GLUCOSE: 83 mg/dL (ref 65–99)
Potassium: 4.9 mmol/L (ref 3.5–5.1)
Sodium: 141 mmol/L (ref 135–145)

## 2018-01-04 LAB — HEPARIN LEVEL (UNFRACTIONATED): Heparin Unfractionated: 0.64 IU/mL (ref 0.30–0.70)

## 2018-01-04 LAB — PROTIME-INR
INR: 1.63
Prothrombin Time: 19.2 seconds — ABNORMAL HIGH (ref 11.4–15.2)

## 2018-01-04 MED ORDER — WARFARIN SODIUM 3 MG PO TABS
3.0000 mg | ORAL_TABLET | Freq: Once | ORAL | Status: AC
  Administered 2018-01-04: 3 mg via ORAL
  Filled 2018-01-04: qty 1

## 2018-01-04 NOTE — Progress Notes (Signed)
Soft BPs last night and this morning, holding BP meds for now, MD aware. Will continue to monitor

## 2018-01-04 NOTE — Progress Notes (Signed)
ANTICOAGULATION CONSULT NOTE - Follow Up Consult  Pharmacy Consult for Heparin >> Warfarin Indication: atrial fibrillation  Allergies  Allergen Reactions  . Ace Inhibitors Palpitations    Patient Measurements: Height: 5\' 5"  (165.1 cm) Weight: 224 lb 6.4 oz (101.8 kg) IBW/kg (Calculated) : 57 Heparin Dosing Weight: 83.5 kg  Vital Signs: Temp: 98 F (36.7 C) (02/11 0438) Temp Source: Oral (02/11 0438) BP: 110/56 (02/11 0438) Pulse Rate: 85 (02/11 0438)  Labs: Recent Labs    01/02/18 0416 01/02/18 0735  01/03/18 0820 01/03/18 1411 01/03/18 1622 01/04/18 0644  HGB 9.6*  --   --  10.1*  --   --  9.1*  HCT 30.2*  --   --  32.2*  --   --  29.6*  PLT 157  --   --  181  --   --  190  LABPROT 15.4*  --   --  16.6*  --   --  19.2*  INR 1.23  --   --  1.35  --   --  1.63  HEPARINUNFRC <0.10*  --    < > 1.00*  --  0.57 0.64  CREATININE  --  1.53*  --   --  1.34*  --   --    < > = values in this interval not displayed.    Estimated Creatinine Clearance: 50.2 mL/min (A) (by C-G formula based on SCr of 1.34 mg/dL (H)).   Medications:  Medications Prior to Admission  Medication Sig Dispense Refill Last Dose  . acetaminophen (TYLENOL) 325 MG tablet Take 650 mg by mouth every 6 (six) hours as needed (for pain).    Unk at [employer]  . amiodarone (PACERONE) 200 MG tablet TAKE 1 TABLET BY MOUTH EVERY DAY (Patient taking differently: Take 200 mg by mouth once a day) 30 tablet 6 12/28/2017 at pm  . carvedilol (COREG) 3.125 MG tablet Take 1 tablet (3.125 mg total) by mouth 2 (two) times daily. 180 tablet 3 12/28/2017 at 2000  . Investigational - Study Medication Take 1 tablet by mouth 2 (two) times daily. Study name: Galactic HF Study  Additional study details: Omecamtiv Mecarbil or Placebo 1 each PRN 12/28/2017 at Unknown time  . isosorbide-hydrALAZINE (BIDIL) 20-37.5 MG tablet Take 1 tablet by mouth 3 (three) times daily. (Patient taking differently: Take 2 tablets by mouth 3 (three) times daily. )  180 tablet 11 12/28/2017 at pm  . ranolazine (RANEXA) 500 MG 12 hr tablet Take 1 tablet (500 mg total) by mouth 2 (two) times daily. 60 tablet 5 12/28/2017 at pm  . rosuvastatin (CRESTOR) 10 MG tablet TAKE 1 TABLET (10 MG TOTAL) BY MOUTH DAILY. (Patient taking differently: Take 10 mg by mouth at bedtime. ) 90 tablet 2 12/28/2017 at pm  . tamsulosin (FLOMAX) 0.4 MG CAPS capsule Take 1 capsule (0.4 mg total) by mouth daily. 30 capsule 1 12/28/2017 at pm  . furosemide (LASIX) 40 MG tablet Take 40 mg by mouth 2 (two) times daily.   On hold at On hold  . sacubitril-valsartan (ENTRESTO) 49-51 MG Take 1 tablet by mouth 2 (two) times daily. 60 tablet 3 On hold at On hold  . spironolactone (ALDACTONE) 25 MG tablet Take 1 tablet (25 mg total) by mouth daily. 30 tablet 0 On hold at On hold  . warfarin (COUMADIN) 3 MG tablet Take 3 mg by mouth daily at 6 PM.    On hold at On hold    Assessment: 67 yo female on  chronic Coumadin for afib, held on admission for supratherapeutic INR. INR now < 2 after vitamin K given 2/5. She was admitted with hematuria due to self-cath but now resolved. CBC low but stable and no bleeding reported at this time.  INR subtherapeutic but trending up after coumadin restart 2/8.  PTA dose is 3mg  daily and cipro has been started for treatment of enterobacter bacteremia for 2 weeks - which may result in lower coumadin requirements while on therapy.   Heparin also therapeutic on 2100 units/hr.    Goal of Therapy:  INR 2-3 Heparin level 0.3-0.7 units/ml Monitor platelets by anticoagulation protocol: Yes   Plan:  Coumadin 3mg  PO x 1 tonight Continue heparin at 2100 units/hr Daily heparin level, CBC, monitor for s/sx of bleeding  [employer], Pharm.D., BCPS Clinical Pharmacist Pager: [phone] Clinical phone for 01/04/2018 from 8:30-4:00 is [phone]. After 4pm, please call Main Rx [phone]) for assistance. 01/04/2018 9:52 AM

## 2018-01-04 NOTE — Progress Notes (Signed)
PROGRESS NOTE  GLAYDS INSCO TGG:[id number] DOB: Dec 05, 1950 DOA: 12/29/2017 PCP: Biagio Borg, MD  HPI/Recap of past 24 hours:  AANSHI Wagner is a 67 y.o. year old female with medical history significant for atrial fibrillation on Coumadin, chronic systolic congestive heart failure, CAD, hypertension, hyperlipidemia, CKD stage III, previous CVA, COPD, reknot syndrome who presented on 12/29/2017 with weakness and slurred speech and was found to have AKI on CKD with anion gap metabolic acidosis concerning for obstructive uropathy, gram-negative bacteremia and supratherapeutic INR.   Subjective No acute events overnight.  Still having normal bowel movements no signs of blood  Assessment/Plan: Principal Problem:   Acute renal failure superimposed on chronic kidney disease (HCC) Active Problems:   Essential hypertension   Paroxysmal atrial fibrillation (HCC)   Urinary retention   Anemia   Supratherapeutic INR   Acute UTI   Bacteremia due to Gram-negative bacteria   Increased anion gap metabolic acidosis  Enterobacter bacteremia secondary to UTI, stable Transition from IV cefepime to Cipro on 2/8.  Repeat blood cultures have remained negative consistent with clearance .plan for 2 total weeks of antibiotic therapy given bacteremia   AKI on CKD stage III with anion gap metabolic acidosis, resolved Obstructive uropathy, unclear etiology Creatinine remained stable at baseline.  Suspect kidney function was multifactorial etiology related to acute urinary retention and UTI.  Patient will still need outpatient urologic evaluation for urodynamic studies once over acute illness.  Peak creatinine 7.15,  baseline 1.2-1.4.   Continue Flomax  Chronic atrial fibrillation, rate controlled complicated by supratherapeutic INR, resolved INR still subtherapeutic, currently 1.63 today.  Continue heparin bridge to Coumadin, follow pharmacy protocol.  Once INR reaches therapeutic range patient will be safe for  discharge.  Given AKI on CKD unable to use Lovenox bridge. continue to monitor. Continue home amiodarone.    Anemia of chronic disease, stable Baseline hemoglobin range 9-10.  Hemoglobin remains stable. Hematuria has essentially resolved. No other signs of bleeding.Continue to monitor on CBC  Chronic systolic heart failure, nonischemic cardiomyopathy, euvolemic Had to hold first dose of Coreg today given some softer blood pressures (systolics in the 70J).  Still anticipate being able to continue carvedilol Ranexa.  Will hold off on starting any home diuretics to prevent any worsening blood pressure.  Will most likely need to re-start tomorrow and monitor creatinine/kidney function   Code Status: Full code  Family Communication: No family at bedside  Disposition Plan: Needs to have therapeutic INR,  Monitor on oral antibiotics   Consultants:  Nephrology  Procedures:  None  Antimicrobials:  Vancomycin 2/5  Ceftriaxone 2/5  Zosyn 2/5  Cefepime 2/6>>>2/8  Ciprofloxacin 2/8>>  Cultures:  Urine culture: 2/5: Citrobacter, blood culture: 2/5: 1 of 2  enterobacter; 2/6 no growth to date  DVT prophylaxis: SCDs   Objective: Vitals:   01/03/18 1712 01/03/18 2234 01/04/18 0438 01/04/18 0950  BP: 129/66 120/60 (!) 110/56 97/60  Pulse: 63 65 85 74  Resp: 18 18 18 18   Temp: 98.8 F (37.1 C) 98.8 F (37.1 C) 98 F (36.7 C) 98.6 F (37 C)  TempSrc: Oral Oral Oral Oral  SpO2: 98% 98% 94% 93%  Weight:      Height:        Intake/Output Summary (Last 24 hours) at 01/04/2018 1202 Last data filed at 01/04/2018 0600 Gross per 24 hour  Intake 972 ml  Output 1275 ml  Net -303 ml   Filed Weights   12/31/17 2135 01/01/18 2245 01/02/18  2147  Weight: 112.2 kg (247 lb 5.7 oz) 101.6 kg (224 lb) 101.8 kg (224 lb 6.4 oz)    Exam:  General: Lying in bed, no distress, comfortable HEENT: moist oral mucosa Cardiovascular: Regular rate and rhythm, no peripheral edema Respiratory:  minimal crackles at bases, clear breath sounds, on room air Gastrointestinal: Soft, nondistended, nontender, decreased bowel sounds GU: Foley catheter in place draining yellow urine   Data Reviewed: CBC: Recent Labs  Lab 12/30/17 0542 12/31/17 0459 01/02/18 0416 01/03/18 0820 01/04/18 0644  WBC 13.4* 11.3* 10.3 11.3* 14.3*  HGB 9.8* 9.7* 9.6* 10.1* 9.1*  HCT 31.1* 29.5* 30.2* 32.2* 29.6*  MCV 95.4 90.8 94.7 97.0 96.7  PLT 147* 139* 157 181 295   Basic Metabolic Panel: Recent Labs  Lab 12/29/17 2231 12/30/17 0542 12/31/17 0459 01/01/18 0545 01/02/18 0735 01/03/18 1411 01/04/18 0648  NA 137 139 140 143 143 143 141  K 3.9 4.0 3.4* 3.1* 3.3* 3.2* 4.9  CL 108 110 109 108 108 105 108  CO2 13* 13* 18* 24 25 26 22   GLUCOSE 75 69 105* 105* 102* 110* 83  BUN 122* 120* 82* 49* 31* 17 12  CREATININE 6.25* 5.71* 3.01* 1.74* 1.53* 1.34* 1.20*  CALCIUM 8.2* 8.2* 8.2* 8.2* 8.1* 8.5* 8.1*  PHOS 4.5 5.0* 3.1 1.8*  --   --   --    GFR: Estimated Creatinine Clearance: 56 mL/min (A) (by C-G formula based on SCr of 1.2 mg/dL (H)). Liver Function Tests: Recent Labs  Lab 12/29/17 1156 12/29/17 2231 12/30/17 0542 12/31/17 0459 01/01/18 0545  AST 16  --   --   --   --   ALT 14  --   --   --   --   ALKPHOS 49  --   --   --   --   BILITOT 0.8  --   --   --   --   PROT 6.1*  --   --   --   --   ALBUMIN 2.6* 2.5* 2.3* 2.1* 2.1*   No results for input(s): LIPASE, AMYLASE in the last 168 hours. No results for input(s): AMMONIA in the last 168 hours. Coagulation Profile: Recent Labs  Lab 12/31/17 0459 01/01/18 0545 01/02/18 0416 01/03/18 0820 01/04/18 0644  INR 1.39 1.34 1.23 1.35 1.63   Cardiac Enzymes: No results for input(s): CKTOTAL, CKMB, CKMBINDEX, TROPONINI in the last 168 hours. BNP (last 3 results) No results for input(s): PROBNP in the last 8760 hours. HbA1C: No results for input(s): HGBA1C in the last 72 hours. CBG: No results for input(s): GLUCAP in the last 168  hours. Lipid Profile: No results for input(s): CHOL, HDL, LDLCALC, TRIG, CHOLHDL, LDLDIRECT in the last 72 hours. Thyroid Function Tests: No results for input(s): TSH, T4TOTAL, FREET4, T3FREE, THYROIDAB in the last 72 hours. Anemia Panel: No results for input(s): VITAMINB12, FOLATE, FERRITIN, TIBC, IRON, RETICCTPCT in the last 72 hours. Urine analysis:    Component Value Date/Time   COLORURINE AMBER (A) 12/29/2017 1311   APPEARANCEUR CLOUDY (A) 12/29/2017 1311   LABSPEC 1.010 12/29/2017 1311   PHURINE 8.0 12/29/2017 1311   GLUCOSEU NEGATIVE 12/29/2017 1311   GLUCOSEU NEGATIVE 11/10/2017 1216   HGBUR SMALL (A) 12/29/2017 1311   BILIRUBINUR NEGATIVE 12/29/2017 1311   KETONESUR NEGATIVE 12/29/2017 1311   PROTEINUR 100 (A) 12/29/2017 1311   UROBILINOGEN 0.2 11/10/2017 1216   NITRITE NEGATIVE 12/29/2017 1311   LEUKOCYTESUR LARGE (A) 12/29/2017 1311   Sepsis Labs: @LABRCNTIP (procalcitonin:4,lacticidven:4)  )  Recent Results (from the past 240 hour(s))  Urine Culture     Status: Abnormal   Collection Time: 12/29/17  1:11 PM  Result Value Ref Range Status   Specimen Description URINE, CATHETERIZED  Final   Special Requests   Final    NONE Performed at [facility], [address] [place], [place] [address]    Culture >=100,000 COLONIES/mL CITROBACTER FREUNDII (A)  Final   Report Status 01/01/2018 FINAL  Final   Organism ID, Bacteria CITROBACTER FREUNDII (A)  Final      Susceptibility   Citrobacter freundii - MIC*    CEFAZOLIN RESISTANT Resistant     CEFTRIAXONE <=1 SENSITIVE Sensitive     CIPROFLOXACIN <=0.25 SENSITIVE Sensitive     GENTAMICIN <=1 SENSITIVE Sensitive     IMIPENEM <=0.25 SENSITIVE Sensitive     NITROFURANTOIN <=16 SENSITIVE Sensitive     TRIMETH/SULFA <=20 SENSITIVE Sensitive     PIP/TAZO <=4 SENSITIVE Sensitive     * >=100,000 COLONIES/mL CITROBACTER FREUNDII  Blood Culture (routine x 2)     Status: Abnormal   Collection Time: 12/29/17  1:20 PM    Result Value Ref Range Status   Specimen Description LEFT ANTECUBITAL  Final   Special Requests   Final    BOTTLES DRAWN AEROBIC AND ANAEROBIC Blood Culture adequate volume   Culture  Setup Time   Final    GRAM NEGATIVE RODS IN BOTH AEROBIC AND ANAEROBIC BOTTLES CRITICAL RESULT CALLED TO, READ BACK BY AND VERIFIED WITH: A MASTERS PHARMD 12/30/17 3818 JDW Performed at [facility], [place] [address] [place], [place] [address]    Culture ENTEROBACTER CLOACAE (A)  Final   Report Status 01/02/2018 FINAL  Final   Organism ID, Bacteria ENTEROBACTER CLOACAE  Final      Susceptibility   Enterobacter cloacae - MIC*    CEFAZOLIN >=64 RESISTANT Resistant     CEFEPIME <=1 SENSITIVE Sensitive     CEFTAZIDIME >=64 RESISTANT Resistant     CEFTRIAXONE 16 INTERMEDIATE Intermediate     CIPROFLOXACIN <=0.25 SENSITIVE Sensitive     GENTAMICIN <=1 SENSITIVE Sensitive     IMIPENEM <=0.25 SENSITIVE Sensitive     TRIMETH/SULFA <=20 SENSITIVE Sensitive     PIP/TAZO >=128 RESISTANT Resistant     * ENTEROBACTER CLOACAE  Blood Culture ID Panel (Reflexed)     Status: Abnormal   Collection Time: 12/29/17  1:20 PM  Result Value Ref Range Status   Enterococcus species NOT DETECTED NOT DETECTED Final   Listeria monocytogenes NOT DETECTED NOT DETECTED Final   Staphylococcus species NOT DETECTED NOT DETECTED Final   Staphylococcus aureus NOT DETECTED NOT DETECTED Final   Streptococcus species NOT DETECTED NOT DETECTED Final   Streptococcus agalactiae NOT DETECTED NOT DETECTED Final   Streptococcus pneumoniae NOT DETECTED NOT DETECTED Final   Streptococcus pyogenes NOT DETECTED NOT DETECTED Final   Acinetobacter baumannii NOT DETECTED NOT DETECTED Final   Enterobacteriaceae species DETECTED (A) NOT DETECTED Final    Comment: Enterobacteriaceae represent a large family of gram-negative bacteria, not a single organism. CRITICAL RESULT CALLED TO, READ BACK BY AND VERIFIED WITH: A MASTERS PHARMD 12/30/17 0605  JDW    Enterobacter cloacae complex DETECTED (A) NOT DETECTED Final    Comment: CRITICAL RESULT CALLED TO, READ BACK BY AND VERIFIED WITH: A MASTERS PHARMD 12/30/17 0605 JDW    Escherichia coli NOT DETECTED NOT DETECTED Final   Klebsiella oxytoca NOT DETECTED NOT DETECTED Final   Klebsiella pneumoniae NOT DETECTED  NOT DETECTED Final   Proteus species NOT DETECTED NOT DETECTED Final   Serratia marcescens NOT DETECTED NOT DETECTED Final   Carbapenem resistance NOT DETECTED NOT DETECTED Final   Haemophilus influenzae NOT DETECTED NOT DETECTED Final   Neisseria meningitidis NOT DETECTED NOT DETECTED Final   Pseudomonas aeruginosa NOT DETECTED NOT DETECTED Final   Candida albicans NOT DETECTED NOT DETECTED Final   Candida glabrata NOT DETECTED NOT DETECTED Final   Candida krusei NOT DETECTED NOT DETECTED Final   Candida parapsilosis NOT DETECTED NOT DETECTED Final   Candida tropicalis NOT DETECTED NOT DETECTED Final  Blood Culture (routine x 2)     Status: None   Collection Time: 12/29/17  3:00 PM  Result Value Ref Range Status   Specimen Description BLOOD LEFT ARM  Final   Special Requests   Final    BOTTLES DRAWN AEROBIC AND ANAEROBIC Blood Culture adequate volume   Culture   Final    NO GROWTH 5 DAYS Performed at [facility], [address] [place], [place] [address]    Report Status 01/03/2018 FINAL  Final  Culture, blood (Routine X 2) w Reflex to ID Panel     Status: None   Collection Time: 12/30/17  2:45 PM  Result Value Ref Range Status   Specimen Description BLOOD LEFT HAND  Final   Special Requests   Final    BOTTLES DRAWN AEROBIC AND ANAEROBIC Blood Culture results may not be optimal due to an inadequate volume of blood received in culture bottles   Culture   Final    NO GROWTH 5 DAYS Performed at [facility], [place] [address], [place], [place] [address]    Report Status 01/04/2018 FINAL  Final  Culture, blood (Routine X 2) w Reflex to ID Panel     Status:  None   Collection Time: 12/30/17  3:00 PM  Result Value Ref Range Status   Specimen Description BLOOD RIGHT HAND  Final   Special Requests   Final    BOTTLES DRAWN AEROBIC AND ANAEROBIC Blood Culture results may not be optimal due to an inadequate volume of blood received in culture bottles   Culture   Final    NO GROWTH 5 DAYS Performed at [facility], [place] [address], [place], [place] [address]    Report Status 01/04/2018 FINAL  Final  C difficile quick scan w PCR reflex     Status: None   Collection Time: 12/31/17 10:30 AM  Result Value Ref Range Status   C Diff antigen NEGATIVE NEGATIVE Final   C Diff toxin NEGATIVE NEGATIVE Final   C Diff interpretation No C. difficile detected.  Final    Comment: Performed at [facility], [place] [address], [place], [place] [address]      Studies: No results found.  Scheduled Meds: . amiodarone  200 mg Oral Daily  . carvedilol  3.125 mg Oral BID  . ciprofloxacin  500 mg Oral BID  . famotidine  20 mg Oral QHS  . isosorbide-hydrALAZINE  1 tablet Oral TID  . ranolazine  500 mg Oral BID  . tamsulosin  0.4 mg Oral QPC supper  . warfarin  3 mg Oral ONCE-1800  . Warfarin - Pharmacist Dosing Inpatient   Does not apply q1800    Continuous Infusions: . heparin 2,100 Units/hr (01/04/18 0858)     LOS: 6 days     Desiree Hane, MD [facility] Pager [phone]  If  7PM-7AM, please contact night-coverage [email or web address] Password [facility] 01/04/2018, 12:02 PM

## 2018-01-05 DIAGNOSIS — T45515A Adverse effect of anticoagulants, initial encounter: Secondary | ICD-10-CM

## 2018-01-05 DIAGNOSIS — D6832 Hemorrhagic disorder due to extrinsic circulating anticoagulants: Secondary | ICD-10-CM

## 2018-01-05 DIAGNOSIS — R339 Retention of urine, unspecified: Secondary | ICD-10-CM

## 2018-01-05 LAB — PROTIME-INR
INR: 1.58
Prothrombin Time: 18.7 seconds — ABNORMAL HIGH (ref 11.4–15.2)

## 2018-01-05 LAB — CBC
HCT: 28.4 % — ABNORMAL LOW (ref 36.0–46.0)
Hemoglobin: 8.8 g/dL — ABNORMAL LOW (ref 12.0–15.0)
MCH: 30 pg (ref 26.0–34.0)
MCHC: 31 g/dL (ref 30.0–36.0)
MCV: 96.9 fL (ref 78.0–100.0)
Platelets: 182 10*3/uL (ref 150–400)
RBC: 2.93 MIL/uL — ABNORMAL LOW (ref 3.87–5.11)
RDW: 15.9 % — ABNORMAL HIGH (ref 11.5–15.5)
WBC: 13.2 10*3/uL — ABNORMAL HIGH (ref 4.0–10.5)

## 2018-01-05 LAB — HEPARIN LEVEL (UNFRACTIONATED): Heparin Unfractionated: 0.7 IU/mL (ref 0.30–0.70)

## 2018-01-05 LAB — BASIC METABOLIC PANEL
Anion gap: 10 (ref 5–15)
BUN: 11 mg/dL (ref 6–20)
CO2: 27 mmol/L (ref 22–32)
Calcium: 8.2 mg/dL — ABNORMAL LOW (ref 8.9–10.3)
Chloride: 105 mmol/L (ref 101–111)
Creatinine, Ser: 1.33 mg/dL — ABNORMAL HIGH (ref 0.44–1.00)
GFR calc Af Amer: 48 mL/min — ABNORMAL LOW (ref >60)
GFR calc non Af Amer: 41 mL/min — ABNORMAL LOW (ref >60)
Glucose, Bld: 91 mg/dL (ref 65–99)
Potassium: 3.4 mmol/L — ABNORMAL LOW (ref 3.5–5.1)
Sodium: 142 mmol/L (ref 135–145)

## 2018-01-05 MED ORDER — POTASSIUM CHLORIDE 20 MEQ PO PACK
40.0000 meq | PACK | Freq: Once | ORAL | Status: AC
  Administered 2018-01-05: 40 meq via ORAL
  Filled 2018-01-05: qty 2

## 2018-01-05 MED ORDER — WARFARIN SODIUM 5 MG PO TABS
5.0000 mg | ORAL_TABLET | Freq: Once | ORAL | Status: AC
  Administered 2018-01-05: 5 mg via ORAL
  Filled 2018-01-05: qty 1

## 2018-01-05 NOTE — Progress Notes (Signed)
PROGRESS NOTE  Patricia Wagner SWF:[id number] DOB: 1951/03/31 DOA: 12/29/2017 PCP: Biagio Borg, MD  HPI/Recap of past 24 hours:  Patricia Wagner is a 67 y.o. year old female with medical history significant for atrial fibrillation on Coumadin, chronic systolic congestive heart failure, CAD, hypertension, hyperlipidemia, CKD stage III, previous CVA, COPD, reknot syndrome who presented on 12/29/2017 with weakness and slurred speech and was found to have AKI on CKD with anion gap metabolic acidosis concerning for obstructive uropathy, gram-negative bacteremia and supratherapeutic INR.   Subjective No acute events overnight.  Still having normal bowel movements no signs of blood  Assessment/Plan: Principal Problem:   Acute renal failure superimposed on chronic kidney disease ([place]) Active Problems:   Essential hypertension   Paroxysmal atrial fibrillation (HCC)   Urinary retention   Anemia   Supratherapeutic INR   Acute UTI   Bacteremia due to Gram-negative bacteria   Increased anion gap metabolic acidosis  Enterobacter bacteremia  Citrobacter UTI, stable Risk factor prior to admission: in and out catheter use Transitioned from IV cefepime (3 days) to Cipro on 2/8 (curbsided ID who rec'd bactrim but will interact with warfarin).   Repeat blood cultures have remained negative consistent with clearance  Plan for 2 total weeks of antibiotic therapy given bacteremia  AKI on CKD stage III with anion gap metabolic acidosis, resolved Obstructive uropathy, unclear etiology Creatinine back to baseline.   With nephrology assistance improved with Foley catheter and IV fluids Peak creatinine 7.15 during admission,  baseline 1.2-1.4. Suspect kidney dysfunction was multifactorial etiology related to acute urinary retention and UTI.  -Consider removal of Foley catheter prior to discharge, I discussed this with Patricia Wagner but I am concerned that she will again require intermittent caths which was likely  the nidus for this current infection -Will still need outpatient urologic evaluation for urodynamic studies once over acute illness. -Instructed patient to reschedule urology appointment -Continue Flomax  Chronic atrial fibrillation, rate controlled complicated by supratherapeutic INR, resolved INR still subtherapeutic, currently 1.58 today.   Continue heparin bridge to Coumadin, follow pharmacy protocol.   Given AKI on CKD unable to use Lovenox bridge. -Once INR reaches therapeutic range patient will be safe for discharge.    -Continue home amiodarone.    Anemia of chronic disease, stable Baseline hemoglobin range 9-10.   Hemoglobin remains stable.  Hematuria has essentially resolved. No other signs of bleeding. -Continue to monitor on CBC  Chronic systolic heart failure, nonischemic cardiomyopathy, euvolemic TTE on 06/2017: EF 30-35% - Continue carvedilol, Ranexa, bidil.   -Will hold off on starting any home diuretics (Lasix and spironolactone) in light of AK I on CKD  Code Status: Full code  Family Communication: No family at bedside  Disposition Plan: Once INR therapeutic can be discharged home   Consultants:  Nephrology  Procedures:  None  Antimicrobials:  Vancomycin 2/5  Ceftriaxone 2/5  Zosyn 2/5  Cefepime 2/6>>>2/8  Ciprofloxacin 2/8>>  Cultures:  Urine culture: 2/5: Citrobacter, blood culture: 2/5: 1 of 2  enterobacter; 2/6 no growth to date  DVT prophylaxis: SCDs   Objective: Vitals:   01/04/18 1742 01/04/18 2050 01/05/18 0606 01/05/18 1000  BP: 105/72 (!) 109/54 101/65 118/61  Pulse: 66 70 75 62  Resp: 18 18 18 18   Temp: 98.4 F (36.9 C) 98.4 F (36.9 C) 98 F (36.7 C) 98.4 F (36.9 C)  TempSrc: Oral Oral Oral Oral  SpO2: 100% 100% 100% 100%  Weight:  101.7 kg (224 lb 2.5  oz)    Height:        Intake/Output Summary (Last 24 hours) at 01/05/2018 1328 Last data filed at 01/05/2018 1142 Gross per 24 hour  Intake 1104 ml  Output 775  ml  Net 329 ml   Filed Weights   01/01/18 2245 01/02/18 2147 01/04/18 2050  Weight: 101.6 kg (224 lb) 101.8 kg (224 lb 6.4 oz) 101.7 kg (224 lb 2.5 oz)    Exam:  General: Lying in bed, no distress, comfortable HEENT: moist oral mucosa Cardiovascular: Irregularly irregular, normal rate rhythm, no peripheral edema Respiratory: minimal crackles at bases, clear breath sounds, on room air Gastrointestinal: Soft, nondistended, nontender, decreased bowel sounds GU: Foley catheter in place draining yellow urine   Data Reviewed: CBC: Recent Labs  Lab 12/31/17 0459 01/02/18 0416 01/03/18 0820 01/04/18 0644 01/05/18 0614  WBC 11.3* 10.3 11.3* 14.3* 13.2*  HGB 9.7* 9.6* 10.1* 9.1* 8.8*  HCT 29.5* 30.2* 32.2* 29.6* 28.4*  MCV 90.8 94.7 97.0 96.7 96.9  PLT 139* 157 181 190 175   Basic Metabolic Panel: Recent Labs  Lab 12/29/17 2231 12/30/17 0542 12/31/17 0459 01/01/18 0545 01/02/18 0735 01/03/18 1411 01/04/18 0648 01/05/18 0614  NA 137 139 140 143 143 143 141 142  K 3.9 4.0 3.4* 3.1* 3.3* 3.2* 4.9 3.4*  CL 108 110 109 108 108 105 108 105  CO2 13* 13* 18* 24 25 26 22 27   GLUCOSE 75 69 105* 105* 102* 110* 83 91  BUN 122* 120* 82* 49* 31* 17 12 11   CREATININE 6.25* 5.71* 3.01* 1.74* 1.53* 1.34* 1.20* 1.33*  CALCIUM 8.2* 8.2* 8.2* 8.2* 8.1* 8.5* 8.1* 8.2*  PHOS 4.5 5.0* 3.1 1.8*  --   --   --   --    GFR: Estimated Creatinine Clearance: 50.5 mL/min (A) (by C-G formula based on SCr of 1.33 mg/dL (H)). Liver Function Tests: Recent Labs  Lab 12/29/17 2231 12/30/17 0542 12/31/17 0459 01/01/18 0545  ALBUMIN 2.5* 2.3* 2.1* 2.1*   No results for input(s): LIPASE, AMYLASE in the last 168 hours. No results for input(s): AMMONIA in the last 168 hours. Coagulation Profile: Recent Labs  Lab 01/01/18 0545 01/02/18 0416 01/03/18 0820 01/04/18 0644 01/05/18 0614  INR 1.34 1.23 1.35 1.63 1.58   Cardiac Enzymes: No results for input(s): CKTOTAL, CKMB, CKMBINDEX, TROPONINI  in the last 168 hours. BNP (last 3 results) No results for input(s): PROBNP in the last 8760 hours. HbA1C: No results for input(s): HGBA1C in the last 72 hours. CBG: No results for input(s): GLUCAP in the last 168 hours. Lipid Profile: No results for input(s): CHOL, HDL, LDLCALC, TRIG, CHOLHDL, LDLDIRECT in the last 72 hours. Thyroid Function Tests: No results for input(s): TSH, T4TOTAL, FREET4, T3FREE, THYROIDAB in the last 72 hours. Anemia Panel: No results for input(s): VITAMINB12, FOLATE, FERRITIN, TIBC, IRON, RETICCTPCT in the last 72 hours. Urine analysis:    Component Value Date/Time   COLORURINE AMBER (A) 12/29/2017 1311   APPEARANCEUR CLOUDY (A) 12/29/2017 1311   LABSPEC 1.010 12/29/2017 1311   PHURINE 8.0 12/29/2017 1311   GLUCOSEU NEGATIVE 12/29/2017 1311   GLUCOSEU NEGATIVE 11/10/2017 1216   HGBUR SMALL (A) 12/29/2017 1311   BILIRUBINUR NEGATIVE 12/29/2017 1311   KETONESUR NEGATIVE 12/29/2017 1311   PROTEINUR 100 (A) 12/29/2017 1311   UROBILINOGEN 0.2 11/10/2017 1216   NITRITE NEGATIVE 12/29/2017 1311   LEUKOCYTESUR LARGE (A) 12/29/2017 1311   Sepsis Labs: @LABRCNTIP (procalcitonin:4,lacticidven:4)  ) Recent Results (from the past 240 hour(s))  Urine Culture  Status: Abnormal   Collection Time: 12/29/17  1:11 PM  Result Value Ref Range Status   Specimen Description URINE, CATHETERIZED  Final   Special Requests   Final    NONE Performed at [facility], [address] [place], [place] [address]    Culture >=100,000 COLONIES/mL CITROBACTER FREUNDII (A)  Final   Report Status 01/01/2018 FINAL  Final   Organism ID, Bacteria CITROBACTER FREUNDII (A)  Final      Susceptibility   Citrobacter freundii - MIC*    CEFAZOLIN RESISTANT Resistant     CEFTRIAXONE <=1 SENSITIVE Sensitive     CIPROFLOXACIN <=0.25 SENSITIVE Sensitive     GENTAMICIN <=1 SENSITIVE Sensitive     IMIPENEM <=0.25 SENSITIVE Sensitive     NITROFURANTOIN <=16 SENSITIVE Sensitive      TRIMETH/SULFA <=20 SENSITIVE Sensitive     PIP/TAZO <=4 SENSITIVE Sensitive     * >=100,000 COLONIES/mL CITROBACTER FREUNDII  Blood Culture (routine x 2)     Status: Abnormal   Collection Time: 12/29/17  1:20 PM  Result Value Ref Range Status   Specimen Description LEFT ANTECUBITAL  Final   Special Requests   Final    BOTTLES DRAWN AEROBIC AND ANAEROBIC Blood Culture adequate volume   Culture  Setup Time   Final    GRAM NEGATIVE RODS IN BOTH AEROBIC AND ANAEROBIC BOTTLES CRITICAL RESULT CALLED TO, READ BACK BY AND VERIFIED WITH: A MASTERS PHARMD 12/30/17 0272 JDW Performed at [facility], [place] [address] [place], [place] [address]    Culture ENTEROBACTER CLOACAE (A)  Final   Report Status 01/02/2018 FINAL  Final   Organism ID, Bacteria ENTEROBACTER CLOACAE  Final      Susceptibility   Enterobacter cloacae - MIC*    CEFAZOLIN >=64 RESISTANT Resistant     CEFEPIME <=1 SENSITIVE Sensitive     CEFTAZIDIME >=64 RESISTANT Resistant     CEFTRIAXONE 16 INTERMEDIATE Intermediate     CIPROFLOXACIN <=0.25 SENSITIVE Sensitive     GENTAMICIN <=1 SENSITIVE Sensitive     IMIPENEM <=0.25 SENSITIVE Sensitive     TRIMETH/SULFA <=20 SENSITIVE Sensitive     PIP/TAZO >=128 RESISTANT Resistant     * ENTEROBACTER CLOACAE  Blood Culture ID Panel (Reflexed)     Status: Abnormal   Collection Time: 12/29/17  1:20 PM  Result Value Ref Range Status   Enterococcus species NOT DETECTED NOT DETECTED Final   Listeria monocytogenes NOT DETECTED NOT DETECTED Final   Staphylococcus species NOT DETECTED NOT DETECTED Final   Staphylococcus aureus NOT DETECTED NOT DETECTED Final   Streptococcus species NOT DETECTED NOT DETECTED Final   Streptococcus agalactiae NOT DETECTED NOT DETECTED Final   Streptococcus pneumoniae NOT DETECTED NOT DETECTED Final   Streptococcus pyogenes NOT DETECTED NOT DETECTED Final   Acinetobacter baumannii NOT DETECTED NOT DETECTED Final   Enterobacteriaceae species DETECTED (A) NOT  DETECTED Final    Comment: Enterobacteriaceae represent a large family of gram-negative bacteria, not a single organism. CRITICAL RESULT CALLED TO, READ BACK BY AND VERIFIED WITH: A MASTERS PHARMD 12/30/17 0605 JDW    Enterobacter cloacae complex DETECTED (A) NOT DETECTED Final    Comment: CRITICAL RESULT CALLED TO, READ BACK BY AND VERIFIED WITH: A MASTERS PHARMD 12/30/17 0605 JDW    Escherichia coli NOT DETECTED NOT DETECTED Final   Klebsiella oxytoca NOT DETECTED NOT DETECTED Final   Klebsiella pneumoniae NOT DETECTED NOT DETECTED Final   Proteus species NOT DETECTED NOT DETECTED Final   Serratia  marcescens NOT DETECTED NOT DETECTED Final   Carbapenem resistance NOT DETECTED NOT DETECTED Final   Haemophilus influenzae NOT DETECTED NOT DETECTED Final   Neisseria meningitidis NOT DETECTED NOT DETECTED Final   Pseudomonas aeruginosa NOT DETECTED NOT DETECTED Final   Candida albicans NOT DETECTED NOT DETECTED Final   Candida glabrata NOT DETECTED NOT DETECTED Final   Candida krusei NOT DETECTED NOT DETECTED Final   Candida parapsilosis NOT DETECTED NOT DETECTED Final   Candida tropicalis NOT DETECTED NOT DETECTED Final  Blood Culture (routine x 2)     Status: None   Collection Time: 12/29/17  3:00 PM  Result Value Ref Range Status   Specimen Description BLOOD LEFT ARM  Final   Special Requests   Final    BOTTLES DRAWN AEROBIC AND ANAEROBIC Blood Culture adequate volume   Culture   Final    NO GROWTH 5 DAYS Performed at [facility], [place] [address], [place], [place] [address]    Report Status 01/03/2018 FINAL  Final  Culture, blood (Routine X 2) w Reflex to ID Panel     Status: None   Collection Time: 12/30/17  2:45 PM  Result Value Ref Range Status   Specimen Description BLOOD LEFT HAND  Final   Special Requests   Final    BOTTLES DRAWN AEROBIC AND ANAEROBIC Blood Culture results may not be optimal due to an inadequate volume of blood received in culture bottles   Culture    Final    NO GROWTH 5 DAYS Performed at [facility], [place] [address] [place], [place] [address]    Report Status 01/04/2018 FINAL  Final  Culture, blood (Routine X 2) w Reflex to ID Panel     Status: None   Collection Time: 12/30/17  3:00 PM  Result Value Ref Range Status   Specimen Description BLOOD RIGHT HAND  Final   Special Requests   Final    BOTTLES DRAWN AEROBIC AND ANAEROBIC Blood Culture results may not be optimal due to an inadequate volume of blood received in culture bottles   Culture   Final    NO GROWTH 5 DAYS Performed at [facility], [place] [address], [place], [place] [address]    Report Status 01/04/2018 FINAL  Final  C difficile quick scan w PCR reflex     Status: None   Collection Time: 12/31/17 10:30 AM  Result Value Ref Range Status   C Diff antigen NEGATIVE NEGATIVE Final   C Diff toxin NEGATIVE NEGATIVE Final   C Diff interpretation No C. difficile detected.  Final    Comment: Performed at [facility], [place] [address] [place], [place] [address]      Studies: No results found.  Scheduled Meds: . amiodarone  200 mg Oral Daily  . carvedilol  3.125 mg Oral BID  . ciprofloxacin  500 mg Oral BID  . famotidine  20 mg Oral QHS  . isosorbide-hydrALAZINE  1 tablet Oral TID  . potassium chloride  40 mEq Oral Once  . ranolazine  500 mg Oral BID  . tamsulosin  0.4 mg Oral QPC supper  . warfarin  5 mg Oral ONCE-1800  . Warfarin - Pharmacist Dosing Inpatient   Does not apply q1800    Continuous Infusions: . heparin 2,000 Units/hr (01/05/18 1212)     LOS: 7 days     Desiree Hane, MD [facility] Pager [phone]  If 7PM-7AM, please contact night-coverage [email or web address] Password  [email or web address] 01/05/2018, 1:28 PM

## 2018-01-05 NOTE — Progress Notes (Signed)
ANTICOAGULATION CONSULT NOTE - Follow Up Consult  Pharmacy Consult for Heparin >> Warfarin Indication: atrial fibrillation  Allergies  Allergen Reactions  . Ace Inhibitors Palpitations    Patient Measurements: Height: 5\' 5"  (165.1 cm) Weight: 224 lb 2.5 oz (101.7 kg) IBW/kg (Calculated) : 57 Heparin Dosing Weight: 83.5 kg  Vital Signs: Temp: 98.4 F (36.9 C) (02/12 1000) Temp Source: Oral (02/12 1000) BP: 118/61 (02/12 1000) Pulse Rate: 62 (02/12 1000)  Labs: Recent Labs    01/03/18 0820 01/03/18 1411 01/03/18 1622 01/04/18 0644 01/04/18 0648 01/05/18 0614  HGB 10.1*  --   --  9.1*  --  8.8*  HCT 32.2*  --   --  29.6*  --  28.4*  PLT 181  --   --  190  --  182  LABPROT 16.6*  --   --  19.2*  --  18.7*  INR 1.35  --   --  1.63  --  1.58  HEPARINUNFRC 1.00*  --  0.57 0.64  --  0.70  CREATININE  --  1.34*  --   --  1.20* 1.33*    Estimated Creatinine Clearance: 50.5 mL/min (A) (by C-G formula based on SCr of 1.33 mg/dL (H)).   Medications:  Medications Prior to Admission  Medication Sig Dispense Refill Last Dose  . acetaminophen (TYLENOL) 325 MG tablet Take 650 mg by mouth every 6 (six) hours as needed (for pain).    Unk at [employer]  . amiodarone (PACERONE) 200 MG tablet TAKE 1 TABLET BY MOUTH EVERY DAY (Patient taking differently: Take 200 mg by mouth once a day) 30 tablet 6 12/28/2017 at pm  . carvedilol (COREG) 3.125 MG tablet Take 1 tablet (3.125 mg total) by mouth 2 (two) times daily. 180 tablet 3 12/28/2017 at 2000  . Investigational - Study Medication Take 1 tablet by mouth 2 (two) times daily. Study name: Galactic HF Study  Additional study details: Omecamtiv Mecarbil or Placebo 1 each PRN 12/28/2017 at Unknown time  . isosorbide-hydrALAZINE (BIDIL) 20-37.5 MG tablet Take 1 tablet by mouth 3 (three) times daily. (Patient taking differently: Take 2 tablets by mouth 3 (three) times daily. ) 180 tablet 11 12/28/2017 at pm  . ranolazine (RANEXA) 500 MG 12 hr tablet Take 1  tablet (500 mg total) by mouth 2 (two) times daily. 60 tablet 5 12/28/2017 at pm  . rosuvastatin (CRESTOR) 10 MG tablet TAKE 1 TABLET (10 MG TOTAL) BY MOUTH DAILY. (Patient taking differently: Take 10 mg by mouth at bedtime. ) 90 tablet 2 12/28/2017 at pm  . tamsulosin (FLOMAX) 0.4 MG CAPS capsule Take 1 capsule (0.4 mg total) by mouth daily. 30 capsule 1 12/28/2017 at pm  . furosemide (LASIX) 40 MG tablet Take 40 mg by mouth 2 (two) times daily.   On hold at On hold  . sacubitril-valsartan (ENTRESTO) 49-51 MG Take 1 tablet by mouth 2 (two) times daily. 60 tablet 3 On hold at On hold  . spironolactone (ALDACTONE) 25 MG tablet Take 1 tablet (25 mg total) by mouth daily. 30 tablet 0 On hold at On hold  . warfarin (COUMADIN) 3 MG tablet Take 3 mg by mouth daily at 6 PM.    On hold at On hold    Assessment: 67 yo female on chronic Coumadin for afib, held on admission for supratherapeutic INR. INR now < 2 after vitamin K given 2/5. She was admitted with hematuria due to self-cath but now resolved. CBC low but stable and no  bleeding reported at this time.  INR slow to rise after coumadin restart 2/8.  PTA dose is 3mg  daily and cipro has been started for treatment of enterobacter bacteremia for 2 weeks - which may result in lower coumadin requirements while on therapy.  However, also need to overcome Vit K which was given on 2/5.   Heparin at upper level of goal on 2100 units/hr.    Goal of Therapy:  INR 2-3 Heparin level 0.3-0.7 units/ml Monitor platelets by anticoagulation protocol: Yes   Plan:  Increase Coumadin 5mg  PO x 1 tonight Reduce heparin to 2000 units/hr Daily heparin level, CBC, monitor for s/sx of bleeding  [employer], Pharm.D., BCPS Clinical Pharmacist Pager: [phone] Clinical phone for 01/05/2018 from 8:30-4:00 is [phone]. After 4pm, please call Main Rx [phone]) for assistance. 01/05/2018 11:32 AM

## 2018-01-05 NOTE — Progress Notes (Signed)
Patient refused to have bed alarm be turned on.Explained to patient that her gait is not too steady and that she has foley catheter and IV pump.Patient adamantly refused. Patient advised to call before getting up. Call bell within reach. Will continue to monitor. Aneliz Carbary, Wonda Cheng, Therapist, sports

## 2018-01-06 DIAGNOSIS — N179 Acute kidney failure, unspecified: Secondary | ICD-10-CM

## 2018-01-06 DIAGNOSIS — N183 Chronic kidney disease, stage 3 (moderate): Secondary | ICD-10-CM

## 2018-01-06 DIAGNOSIS — N390 Urinary tract infection, site not specified: Secondary | ICD-10-CM

## 2018-01-06 LAB — CBC
HEMATOCRIT: 27.8 % — AB (ref 36.0–46.0)
Hemoglobin: 8.7 g/dL — ABNORMAL LOW (ref 12.0–15.0)
MCH: 30.6 pg (ref 26.0–34.0)
MCHC: 31.3 g/dL (ref 30.0–36.0)
MCV: 97.9 fL (ref 78.0–100.0)
Platelets: 217 10*3/uL (ref 150–400)
RBC: 2.84 MIL/uL — AB (ref 3.87–5.11)
RDW: 16 % — ABNORMAL HIGH (ref 11.5–15.5)
WBC: 9.9 10*3/uL (ref 4.0–10.5)

## 2018-01-06 LAB — PROTIME-INR
INR: 1.76
Prothrombin Time: 20.4 seconds — ABNORMAL HIGH (ref 11.4–15.2)

## 2018-01-06 LAB — HEPARIN LEVEL (UNFRACTIONATED): Heparin Unfractionated: 0.67 IU/mL (ref 0.30–0.70)

## 2018-01-06 MED ORDER — WARFARIN SODIUM 5 MG PO TABS
5.0000 mg | ORAL_TABLET | Freq: Once | ORAL | Status: AC
  Administered 2018-01-06: 5 mg via ORAL
  Filled 2018-01-06: qty 1

## 2018-01-06 MED ORDER — POTASSIUM CHLORIDE CRYS ER 20 MEQ PO TBCR
20.0000 meq | EXTENDED_RELEASE_TABLET | Freq: Once | ORAL | Status: AC
  Administered 2018-01-06: 20 meq via ORAL
  Filled 2018-01-06: qty 1

## 2018-01-06 NOTE — Progress Notes (Signed)
ANTICOAGULATION CONSULT NOTE - Follow Up Consult  Pharmacy Consult for Heparin >> Warfarin Indication: atrial fibrillation  Allergies  Allergen Reactions  . Ace Inhibitors Palpitations    Patient Measurements: Height: 5\' 5"  (165.1 cm) Weight: 224 lb 2.6 oz (101.7 kg) IBW/kg (Calculated) : 57 Heparin Dosing Weight: 83.5 kg  Vital Signs: Temp: 98.7 F (37.1 C) (02/13 0900) Temp Source: Oral (02/13 0900) BP: 92/51 (02/13 0900) Pulse Rate: 67 (02/13 0900)  Labs: Recent Labs    01/03/18 1411  01/04/18 0644 01/04/18 0648 01/05/18 0614 01/06/18 0801  HGB  --    < > 9.1*  --  8.8* 8.7*  HCT  --   --  29.6*  --  28.4* 27.8*  PLT  --   --  190  --  182 217  LABPROT  --   --  19.2*  --  18.7* 20.4*  INR  --   --  1.63  --  1.58 1.76  HEPARINUNFRC  --    < > 0.64  --  0.70 0.67  CREATININE 1.34*  --   --  1.20* 1.33*  --    < > = values in this interval not displayed.    Estimated Creatinine Clearance: 50.5 mL/min (A) (by C-G formula based on SCr of 1.33 mg/dL (H)).   Medications:  Medications Prior to Admission  Medication Sig Dispense Refill Last Dose  . acetaminophen (TYLENOL) 325 MG tablet Take 650 mg by mouth every 6 (six) hours as needed (for pain).    Unk at [employer]  . amiodarone (PACERONE) 200 MG tablet TAKE 1 TABLET BY MOUTH EVERY DAY (Patient taking differently: Take 200 mg by mouth once a day) 30 tablet 6 12/28/2017 at pm  . carvedilol (COREG) 3.125 MG tablet Take 1 tablet (3.125 mg total) by mouth 2 (two) times daily. 180 tablet 3 12/28/2017 at 2000  . Investigational - Study Medication Take 1 tablet by mouth 2 (two) times daily. Study name: Galactic HF Study  Additional study details: Omecamtiv Mecarbil or Placebo 1 each PRN 12/28/2017 at Unknown time  . isosorbide-hydrALAZINE (BIDIL) 20-37.5 MG tablet Take 1 tablet by mouth 3 (three) times daily. (Patient taking differently: Take 2 tablets by mouth 3 (three) times daily. ) 180 tablet 11 12/28/2017 at pm  . ranolazine  (RANEXA) 500 MG 12 hr tablet Take 1 tablet (500 mg total) by mouth 2 (two) times daily. 60 tablet 5 12/28/2017 at pm  . rosuvastatin (CRESTOR) 10 MG tablet TAKE 1 TABLET (10 MG TOTAL) BY MOUTH DAILY. (Patient taking differently: Take 10 mg by mouth at bedtime. ) 90 tablet 2 12/28/2017 at pm  . tamsulosin (FLOMAX) 0.4 MG CAPS capsule Take 1 capsule (0.4 mg total) by mouth daily. 30 capsule 1 12/28/2017 at pm  . furosemide (LASIX) 40 MG tablet Take 40 mg by mouth 2 (two) times daily.   On hold at On hold  . sacubitril-valsartan (ENTRESTO) 49-51 MG Take 1 tablet by mouth 2 (two) times daily. 60 tablet 3 On hold at On hold  . spironolactone (ALDACTONE) 25 MG tablet Take 1 tablet (25 mg total) by mouth daily. 30 tablet 0 On hold at On hold  . warfarin (COUMADIN) 3 MG tablet Take 3 mg by mouth daily at 6 PM.    On hold at On hold    Assessment: 66 yo female on chronic Coumadin for afib, held on admission for supratherapeutic INR. INR now < 2 after vitamin K given 2/5. She was admitted  with hematuria due to self-cath but now resolved. CBC low but stable and no bleeding reported at this time.  INR slow to rise after coumadin restart 2/8.  PTA dose is 3mg  daily and cipro has been started for treatment of enterobacter bacteremia for 2 weeks - which may result in lower coumadin requirements while on therapy.  However, also need to overcome Vit K which was given on 2/5.   Heparin at upper level of goal on 2000 units/hr.    Goal of Therapy:  INR 2-3 Heparin level 0.3-0.7 units/ml Monitor platelets by anticoagulation protocol: Yes   Plan:  Increase Coumadin 5mg  PO x 1 tonight Anticipate decrease Coumadin to home dose of 3mg  daily starting 2/14. Reduce heparin to 1900 units/hr Daily heparin level, CBC, monitor for s/sx of bleeding  [employer], Pharm.D., BCPS Clinical Pharmacist Pager: [phone] Clinical phone for 01/06/2018 from 8:30-4:00 is [phone]. After 4pm, please call Main Rx [phone]) for  assistance. 01/06/2018 11:37 AM

## 2018-01-06 NOTE — Progress Notes (Addendum)
0700 Bedside shift report, pt sleeping, easy to arouse. NAD, refuses bed alarm, WCTM.   0930 Pt medicated per MAR, assessed, see flow sheet. Pt denies, pain, n/v/d, and blood stools. Pt on heparin gtt, INR today 1.76, awaiting INR to be therapeutic for discharge. WCTM.   1245 Pt sitting up eating lunch, NAD, no complaints at this time.   1315 Pt assisted up to [facility] and chair, NAD, no complaints, WCTM.   1705 Pt medicated to MAR, pt resting in bed, NAD, no complaints.   1845 Pt resting in bed, awaiting for night shift RN for report. Pt still refusing bed alarm.

## 2018-01-06 NOTE — Progress Notes (Signed)
PROGRESS NOTE  Patricia Wagner PZW:[id number] DOB: 1951/02/02 DOA: 12/29/2017 PCP: Biagio Borg, MD  HPI/Recap of past 24 hours:  Patricia Wagner is a 67 y.o. year old female with medical history significant for atrial fibrillation on Coumadin, chronic systolic congestive heart failure, CAD, hypertension, hyperlipidemia, CKD stage III, previous CVA, COPD, reknot syndrome who presented on 12/29/2017 with weakness and slurred speech and was found to have AKI on CKD with anion gap metabolic acidosis concerning for obstructive uropathy, gram-negative bacteremia and supratherapeutic INR.   Subjective No complaints. Denies dyspnea.   Assessment/Plan: Principal Problem:   Acute renal failure superimposed on chronic kidney disease ([place]) Active Problems:   Essential hypertension   Paroxysmal atrial fibrillation (HCC)   Urinary retention   Anemia   Supratherapeutic INR   Acute UTI   Bacteremia due to Gram-negative bacteria   Increased anion gap metabolic acidosis  1-Enterobacter bacteremia  Citrobacter UTI, stable Risk factor prior to admission: in and out catheter use Transitioned from IV cefepime (3 days) to Cipro on 2/8 (curbsided ID who rec'd bactrim but will interact with warfarin).   Repeat blood cultures have remained negative consistent with clearance  Plan for 2 total weeks of antibiotic therapy given bacteremia   AKI on CKD stage III with anion gap metabolic acidosis, resolved Obstructive uropathy, unclear etiology Creatinine back to baseline.   With nephrology assistance improved with Foley catheter and IV fluids Peak creatinine 7.15 during admission,  baseline 1.2-1.4. Suspect kidney dysfunction was multifactorial etiology related to acute urinary retention and UTI.  -will need to be discharge with foley, close follow up with urology . Second admission for renal failure due to retension.  -Will still need outpatient urologic evaluation for urodynamic studies once over acute  illness. -Instructed patient to reschedule urology appointment -Continue Flomax  Chronic atrial fibrillation, rate controlled complicated by supratherapeutic INR, resolved INR still subtherapeutic, currently 1.58 today.   Continue heparin bridge to Coumadin, follow pharmacy protocol.   Given AKI on CKD unable to use Lovenox bridge. -Continue home amiodarone.   INR at 1.7, hope to be discharge tomorrow.   Anemia of chronic disease, stable Baseline hemoglobin range 9-10.   Hemoglobin remains stable.  Hematuria has essentially resolved. No other signs of bleeding. -Continue to monitor on CBC  Chronic systolic heart failure, nonischemic cardiomyopathy, euvolemic TTE on 06/2017: EF 30-35% - Continue carvedilol, Ranexa, bidil.   -Will hold off on starting any home diuretics (Lasix and spironolactone) in light of AK I on CKD  Hypokalemia;  Repleted  Code Status: Full code  Family Communication: No family at bedside  Disposition Plan: Once INR therapeutic can be discharged home   Consultants:  Nephrology  Procedures:  None  Antimicrobials:  Vancomycin 2/5  Ceftriaxone 2/5  Zosyn 2/5  Cefepime 2/6>>>2/8  Ciprofloxacin 2/8>>  Cultures:  Urine culture: 2/5: Citrobacter, blood culture: 2/5: 1 of 2  enterobacter; 2/6 no growth to date  DVT prophylaxis: SCDs   Objective: Vitals:   01/05/18 1828 01/05/18 2155 01/06/18 0626 01/06/18 0900  BP: (!) 100/56 (!) 106/59 (!) 111/54 (!) 92/51  Pulse: 83 61 (!) 59 67  Resp: 18 18 18 18   Temp: 98 F (36.7 C) 99.2 F (37.3 C) 99.4 F (37.4 C) 98.7 F (37.1 C)  TempSrc: Oral Oral Oral Oral  SpO2: 98% 97% 98% 100%  Weight:  101.7 kg (224 lb 2.6 oz)    Height:        Intake/Output Summary (Last 24 hours)  at 01/06/2018 1449 Last data filed at 01/06/2018 1308 Gross per 24 hour  Intake 1466.87 ml  Output 1275 ml  Net 191.87 ml   Filed Weights   01/02/18 2147 01/04/18 2050 01/05/18 2155  Weight: 101.8 kg (224 lb 6.4  oz) 101.7 kg (224 lb 2.5 oz) 101.7 kg (224 lb 2.6 oz)    Exam:  General: NAD Cardiovascular: IRR Respiratory: CTA Gastrointestinal: Soft, nt, nd GU foley catheter in place    Data Reviewed: CBC: Recent Labs  Lab 01/02/18 0416 01/03/18 0820 01/04/18 0644 01/05/18 0614 01/06/18 0801  WBC 10.3 11.3* 14.3* 13.2* 9.9  HGB 9.6* 10.1* 9.1* 8.8* 8.7*  HCT 30.2* 32.2* 29.6* 28.4* 27.8*  MCV 94.7 97.0 96.7 96.9 97.9  PLT 157 181 190 182 161   Basic Metabolic Panel: Recent Labs  Lab 12/31/17 0459 01/01/18 0545 01/02/18 0735 01/03/18 1411 01/04/18 0648 01/05/18 0614  NA 140 143 143 143 141 142  K 3.4* 3.1* 3.3* 3.2* 4.9 3.4*  CL 109 108 108 105 108 105  CO2 18* 24 25 26 22 27   GLUCOSE 105* 105* 102* 110* 83 91  BUN 82* 49* 31* 17 12 11   CREATININE 3.01* 1.74* 1.53* 1.34* 1.20* 1.33*  CALCIUM 8.2* 8.2* 8.1* 8.5* 8.1* 8.2*  PHOS 3.1 1.8*  --   --   --   --    GFR: Estimated Creatinine Clearance: 50.5 mL/min (A) (by C-G formula based on SCr of 1.33 mg/dL (H)). Liver Function Tests: Recent Labs  Lab 12/31/17 0459 01/01/18 0545  ALBUMIN 2.1* 2.1*   No results for input(s): LIPASE, AMYLASE in the last 168 hours. No results for input(s): AMMONIA in the last 168 hours. Coagulation Profile: Recent Labs  Lab 01/02/18 0416 01/03/18 0820 01/04/18 0644 01/05/18 0614 01/06/18 0801  INR 1.23 1.35 1.63 1.58 1.76   Cardiac Enzymes: No results for input(s): CKTOTAL, CKMB, CKMBINDEX, TROPONINI in the last 168 hours. BNP (last 3 results) No results for input(s): PROBNP in the last 8760 hours. HbA1C: No results for input(s): HGBA1C in the last 72 hours. CBG: No results for input(s): GLUCAP in the last 168 hours. Lipid Profile: No results for input(s): CHOL, HDL, LDLCALC, TRIG, CHOLHDL, LDLDIRECT in the last 72 hours. Thyroid Function Tests: No results for input(s): TSH, T4TOTAL, FREET4, T3FREE, THYROIDAB in the last 72 hours. Anemia Panel: No results for input(s):  VITAMINB12, FOLATE, FERRITIN, TIBC, IRON, RETICCTPCT in the last 72 hours. Urine analysis:    Component Value Date/Time   COLORURINE AMBER (A) 12/29/2017 1311   APPEARANCEUR CLOUDY (A) 12/29/2017 1311   LABSPEC 1.010 12/29/2017 1311   PHURINE 8.0 12/29/2017 1311   GLUCOSEU NEGATIVE 12/29/2017 1311   GLUCOSEU NEGATIVE 11/10/2017 1216   HGBUR SMALL (A) 12/29/2017 1311   BILIRUBINUR NEGATIVE 12/29/2017 1311   KETONESUR NEGATIVE 12/29/2017 1311   PROTEINUR 100 (A) 12/29/2017 1311   UROBILINOGEN 0.2 11/10/2017 1216   NITRITE NEGATIVE 12/29/2017 1311   LEUKOCYTESUR LARGE (A) 12/29/2017 1311   Sepsis Labs: @LABRCNTIP (procalcitonin:4,lacticidven:4)  ) Recent Results (from the past 240 hour(s))  Urine Culture     Status: Abnormal   Collection Time: 12/29/17  1:11 PM  Result Value Ref Range Status   Specimen Description URINE, CATHETERIZED  Final   Special Requests   Final    NONE Performed at [facility], [place] [address], [place], [place] [address]    Culture >=100,000 COLONIES/mL CITROBACTER FREUNDII (A)  Final   Report Status 01/01/2018 FINAL  Final  Organism ID, Bacteria CITROBACTER FREUNDII (A)  Final      Susceptibility   Citrobacter freundii - MIC*    CEFAZOLIN RESISTANT Resistant     CEFTRIAXONE <=1 SENSITIVE Sensitive     CIPROFLOXACIN <=0.25 SENSITIVE Sensitive     GENTAMICIN <=1 SENSITIVE Sensitive     IMIPENEM <=0.25 SENSITIVE Sensitive     NITROFURANTOIN <=16 SENSITIVE Sensitive     TRIMETH/SULFA <=20 SENSITIVE Sensitive     PIP/TAZO <=4 SENSITIVE Sensitive     * >=100,000 COLONIES/mL CITROBACTER FREUNDII  Blood Culture (routine x 2)     Status: Abnormal   Collection Time: 12/29/17  1:20 PM  Result Value Ref Range Status   Specimen Description LEFT ANTECUBITAL  Final   Special Requests   Final    BOTTLES DRAWN AEROBIC AND ANAEROBIC Blood Culture adequate volume   Culture  Setup Time   Final    GRAM NEGATIVE RODS IN BOTH AEROBIC AND ANAEROBIC  BOTTLES CRITICAL RESULT CALLED TO, READ BACK BY AND VERIFIED WITH: A MASTERS PHARMD 12/30/17 5400 JDW Performed at [facility], [place] [address] [place], [place] [address]    Culture ENTEROBACTER CLOACAE (A)  Final   Report Status 01/02/2018 FINAL  Final   Organism ID, Bacteria ENTEROBACTER CLOACAE  Final      Susceptibility   Enterobacter cloacae - MIC*    CEFAZOLIN >=64 RESISTANT Resistant     CEFEPIME <=1 SENSITIVE Sensitive     CEFTAZIDIME >=64 RESISTANT Resistant     CEFTRIAXONE 16 INTERMEDIATE Intermediate     CIPROFLOXACIN <=0.25 SENSITIVE Sensitive     GENTAMICIN <=1 SENSITIVE Sensitive     IMIPENEM <=0.25 SENSITIVE Sensitive     TRIMETH/SULFA <=20 SENSITIVE Sensitive     PIP/TAZO >=128 RESISTANT Resistant     * ENTEROBACTER CLOACAE  Blood Culture ID Panel (Reflexed)     Status: Abnormal   Collection Time: 12/29/17  1:20 PM  Result Value Ref Range Status   Enterococcus species NOT DETECTED NOT DETECTED Final   Listeria monocytogenes NOT DETECTED NOT DETECTED Final   Staphylococcus species NOT DETECTED NOT DETECTED Final   Staphylococcus aureus NOT DETECTED NOT DETECTED Final   Streptococcus species NOT DETECTED NOT DETECTED Final   Streptococcus agalactiae NOT DETECTED NOT DETECTED Final   Streptococcus pneumoniae NOT DETECTED NOT DETECTED Final   Streptococcus pyogenes NOT DETECTED NOT DETECTED Final   Acinetobacter baumannii NOT DETECTED NOT DETECTED Final   Enterobacteriaceae species DETECTED (A) NOT DETECTED Final    Comment: Enterobacteriaceae represent a large family of gram-negative bacteria, not a single organism. CRITICAL RESULT CALLED TO, READ BACK BY AND VERIFIED WITH: A MASTERS PHARMD 12/30/17 0605 JDW    Enterobacter cloacae complex DETECTED (A) NOT DETECTED Final    Comment: CRITICAL RESULT CALLED TO, READ BACK BY AND VERIFIED WITH: A MASTERS PHARMD 12/30/17 0605 JDW    Escherichia coli NOT DETECTED NOT DETECTED Final   Klebsiella oxytoca NOT DETECTED  NOT DETECTED Final   Klebsiella pneumoniae NOT DETECTED NOT DETECTED Final   Proteus species NOT DETECTED NOT DETECTED Final   Serratia marcescens NOT DETECTED NOT DETECTED Final   Carbapenem resistance NOT DETECTED NOT DETECTED Final   Haemophilus influenzae NOT DETECTED NOT DETECTED Final   Neisseria meningitidis NOT DETECTED NOT DETECTED Final   Pseudomonas aeruginosa NOT DETECTED NOT DETECTED Final   Candida albicans NOT DETECTED NOT DETECTED Final   Candida glabrata NOT DETECTED NOT DETECTED Final   Candida krusei NOT DETECTED NOT DETECTED Final  Candida parapsilosis NOT DETECTED NOT DETECTED Final   Candida tropicalis NOT DETECTED NOT DETECTED Final  Blood Culture (routine x 2)     Status: None   Collection Time: 12/29/17  3:00 PM  Result Value Ref Range Status   Specimen Description BLOOD LEFT ARM  Final   Special Requests   Final    BOTTLES DRAWN AEROBIC AND ANAEROBIC Blood Culture adequate volume   Culture   Final    NO GROWTH 5 DAYS Performed at [facility], [address] [place], [place] [address]    Report Status 01/03/2018 FINAL  Final  Culture, blood (Routine X 2) w Reflex to ID Panel     Status: None   Collection Time: 12/30/17  2:45 PM  Result Value Ref Range Status   Specimen Description BLOOD LEFT HAND  Final   Special Requests   Final    BOTTLES DRAWN AEROBIC AND ANAEROBIC Blood Culture results may not be optimal due to an inadequate volume of blood received in culture bottles   Culture   Final    NO GROWTH 5 DAYS Performed at [facility], [place] [address], [place], [place] [address]    Report Status 01/04/2018 FINAL  Final  Culture, blood (Routine X 2) w Reflex to ID Panel     Status: None   Collection Time: 12/30/17  3:00 PM  Result Value Ref Range Status   Specimen Description BLOOD RIGHT HAND  Final   Special Requests   Final    BOTTLES DRAWN AEROBIC AND ANAEROBIC Blood Culture results may not be optimal due to an inadequate volume of  blood received in culture bottles   Culture   Final    NO GROWTH 5 DAYS Performed at [facility], [place] [address], [place], [place] [address]    Report Status 01/04/2018 FINAL  Final  C difficile quick scan w PCR reflex     Status: None   Collection Time: 12/31/17 10:30 AM  Result Value Ref Range Status   C Diff antigen NEGATIVE NEGATIVE Final   C Diff toxin NEGATIVE NEGATIVE Final   C Diff interpretation No C. difficile detected.  Final    Comment: Performed at [facility], [place] [address] [place],  [address]      Studies: No results found.  Scheduled Meds: . amiodarone  200 mg Oral Daily  . carvedilol  3.125 mg Oral BID  . ciprofloxacin  500 mg Oral BID  . famotidine  20 mg Oral QHS  . isosorbide-hydrALAZINE  1 tablet Oral TID  . ranolazine  500 mg Oral BID  . tamsulosin  0.4 mg Oral QPC supper  . warfarin  5 mg Oral ONCE-1800  . Warfarin - Pharmacist Dosing Inpatient   Does not apply q1800    Continuous Infusions: . heparin 1,900 Units/hr (01/06/18 1320)     LOS: 8 days     Elmarie Shiley, MD [facility] Pager [phone]  If 7PM-7AM, please contact night-coverage [email or web address] Password [facility] 01/06/2018, 2:49 PM

## 2018-01-06 NOTE — Care Management Note (Signed)
Case Management Note  Patient Details  Name: Patricia Wagner MRN: [id number] Date of Birth: 07-28-1951   Expected Discharge Date:  01/02/18               Expected Discharge Plan:  Home/Self Care  Discharge planning Services  CM Consult  Status of Service:  In process, will continue to follow  If discussed at Long Length of Stay Meetings, dates discussed:   Case was discussed in Length of Stay, per Dr. Reynaldo Minium bridging using Lovenox to coumadin was recommended. Discussed with Pharmacist "Erin" covering for Lucas Mallow., and No contraindications noted per pharmacist "Erin".  Discussed with Attending Dr. Tyrell Antonio, stated is fearful using Lovenox due to Kidney failure, although her creatinine is better she does not want to take the risk.  Stated today INR 1.7 and is hoping INR will be up to 2 in am and she can be discharged.  [name], RN  Nurse Case Manager [phone] 01/06/2018, 2:45 PM

## 2018-01-07 LAB — BASIC METABOLIC PANEL
Anion gap: 6 (ref 5–15)
BUN: 7 mg/dL (ref 6–20)
CO2: 26 mmol/L (ref 22–32)
Calcium: 8.4 mg/dL — ABNORMAL LOW (ref 8.9–10.3)
Chloride: 108 mmol/L (ref 101–111)
Creatinine, Ser: 1.44 mg/dL — ABNORMAL HIGH (ref 0.44–1.00)
GFR calc Af Amer: 43 mL/min — ABNORMAL LOW (ref >60)
GFR calc non Af Amer: 37 mL/min — ABNORMAL LOW (ref >60)
Glucose, Bld: 82 mg/dL (ref 65–99)
POTASSIUM: 4.1 mmol/L (ref 3.5–5.1)
SODIUM: 140 mmol/L (ref 135–145)

## 2018-01-07 LAB — CBC
HCT: 27.5 % — ABNORMAL LOW (ref 36.0–46.0)
HEMOGLOBIN: 8.5 g/dL — AB (ref 12.0–15.0)
MCH: 29.9 pg (ref 26.0–34.0)
MCHC: 30.9 g/dL (ref 30.0–36.0)
MCV: 96.8 fL (ref 78.0–100.0)
Platelets: 264 10*3/uL (ref 150–400)
RBC: 2.84 MIL/uL — ABNORMAL LOW (ref 3.87–5.11)
RDW: 15.9 % — ABNORMAL HIGH (ref 11.5–15.5)
WBC: 9.6 10*3/uL (ref 4.0–10.5)

## 2018-01-07 LAB — HEPARIN LEVEL (UNFRACTIONATED): Heparin Unfractionated: 0.56 IU/mL (ref 0.30–0.70)

## 2018-01-07 LAB — PROTIME-INR
INR: 1.9
Prothrombin Time: 21.6 seconds — ABNORMAL HIGH (ref 11.4–15.2)

## 2018-01-07 MED ORDER — WARFARIN SODIUM 5 MG PO TABS
5.0000 mg | ORAL_TABLET | Freq: Once | ORAL | Status: AC
  Administered 2018-01-07: 5 mg via ORAL
  Filled 2018-01-07: qty 1

## 2018-01-07 NOTE — Progress Notes (Signed)
ANTICOAGULATION CONSULT NOTE - Follow Up Consult  Pharmacy Consult for Heparin and Coumadin Indication: atrial fibrillation  Allergies  Allergen Reactions  . Ace Inhibitors Palpitations    Patient Measurements: Height: 5\' 5"  (165.1 cm) Weight: 224 lb 3.3 oz (101.7 kg) IBW/kg (Calculated) : 57 Heparin Dosing Weight: 83 kg  Vital Signs: Temp: 98.1 F (36.7 C) (02/14 0945) Temp Source: Oral (02/14 0945) BP: 92/54 (02/14 0945) Pulse Rate: 63 (02/14 0945)  Labs: Recent Labs    01/05/18 0614 01/06/18 0801 01/07/18 0627  HGB 8.8* 8.7* 8.5*  HCT 28.4* 27.8* 27.5*  PLT 182 217 264  LABPROT 18.7* 20.4* 21.6*  INR 1.58 1.76 1.90  HEPARINUNFRC 0.70 0.67 0.56  CREATININE 1.33*  --   --     Estimated Creatinine Clearance: 50.5 mL/min (A) (by C-G formula based on SCr of 1.33 mg/dL (H)).  Assessment:  67 yo female on chronic Coumadin for afib, held on admission for supratherapeutic INR to 8.61; Vitamin K 5 mg IV given on 2/5.   Hematuria on admit due to self-cath but now resolved.    IV heparin begun on 2/6; Coumadin resumed on 2/8.    Heparin level is therapeutic (0.56) on 1900 units/hr. INR up to 1.90 today after Coumadin 5 mg x 2 days.  Just below target range. Day # 7 Cipro, no apparent increased sensitivity to Coumadin doses. CBC low stable, no bleeding reported.    Home Coumadin regimen: 3 mg daily.  Goal of Therapy:  INR 2-3 Heparin level 0.3-0.7 units/ml Monitor platelets by anticoagulation protocol: Yes   Plan:   Continue heparin drip at 1900 units/hr.  Coumadin 5 mg again today.  Daily heparin level, PT/INR and CBC.   Arty Baumgartner, [place] Pager: [phone] 01/07/2018,11:12 AM

## 2018-01-07 NOTE — Progress Notes (Signed)
PROGRESS NOTE  Patricia Wagner:[id number] DOB: [birth year] DOA: 12/29/2017 PCP: Biagio Borg, MD  HPI/Recap of past 24 hours:  Patricia Wagner is a 67 y.o. year old female with medical history significant for atrial fibrillation on Coumadin, chronic systolic congestive heart failure, CAD, hypertension, hyperlipidemia, CKD stage III, previous CVA, COPD, reknot syndrome who presented on 12/29/2017 with weakness and slurred speech and was found to have AKI on CKD with anion gap metabolic acidosis concerning for obstructive uropathy, gram-negative bacteremia and supratherapeutic INR.   Subjective Patient denies dyspnea, no chest pain   Assessment/Plan: Principal Problem:   Acute renal failure superimposed on chronic kidney disease ([place]) Active Problems:   Essential hypertension   Paroxysmal atrial fibrillation (HCC)   Urinary retention   Anemia   Supratherapeutic INR   Acute UTI   Bacteremia due to Gram-negative bacteria   Increased anion gap metabolic acidosis  1-Enterobacter bacteremia  Citrobacter UTI, stable Risk factor prior to admission: in and out catheter use Transitioned from IV cefepime (3 days) to Cipro on 2/8 (curbsided ID who rec'd bactrim but will interact with warfarin).   Repeat blood cultures have remained negative consistent with clearance  Plan for 2 total weeks of antibiotic therapy given bacteremia Day 9  AKI on CKD stage III with anion gap metabolic acidosis, resolved Obstructive uropathy, unclear etiology Creatinine back to baseline.   With nephrology assistance improved with Foley catheter and IV fluids Peak creatinine 7.15 during admission,  baseline 1.2-1.4. Suspect kidney dysfunction was multifactorial etiology related to acute urinary retention and UTI.  -will need to be discharge with foley, close follow up with urology . Second admission for renal failure due to retension.  -Will still need outpatient urologic evaluation for urodynamic studies once over  acute illness. -Instructed patient to reschedule urology appointment -Continue Flomax Cr stable 1.4.   Chronic atrial fibrillation, rate controlled complicated by supratherapeutic INR, resolved INR still subtherapeutic, currently 1.58 today.   Continue heparin bridge to Coumadin, follow pharmacy protocol.   Given AKI on CKD unable to use Lovenox bridge. -Continue home amiodarone.   -INR 1.9  Anemia of chronic disease, stable Baseline hemoglobin range 9-10.   Hemoglobin remains stable.  Hematuria has essentially resolved. No other signs of bleeding. -Continue to monitor on CBC  Chronic systolic heart failure, nonischemic cardiomyopathy, euvolemic TTE on 06/2017: EF 30-35% - Continue carvedilol, Ranexa, bidil.   -Will hold off on starting any home diuretics (Lasix and spironolactone) in light of AK I on CKD  Hypokalemia;  Repleted  Code Status: Full code  Family Communication: No family at bedside  Disposition Plan: hopefully discharge home tomorrow if INR therapeutic   Consultants:  Nephrology  Procedures:  None  Antimicrobials:  Vancomycin 2/5  Ceftriaxone 2/5  Zosyn 2/5  Cefepime 2/6>>>2/8  Ciprofloxacin 2/8>>  Cultures:  Urine culture: 2/5: Citrobacter, blood culture: 2/5: 1 of 2  enterobacter; 2/6 no growth to date  DVT prophylaxis: SCDs   Objective: Vitals:   01/06/18 2036 01/07/18 0500 01/07/18 0527 01/07/18 0945  BP: 114/66  111/60 (!) 92/54  Pulse: 60  68 63  Resp: 16  16 16   Temp: 97.9 F (36.6 C)  98.2 F (36.8 C) 98.1 F (36.7 C)  TempSrc:    Oral  SpO2: 93%  94% 100%  Weight:  101.7 kg (224 lb 3.3 oz)    Height:        Intake/Output Summary (Last 24 hours) at 01/07/2018 1446 Last data filed at  01/07/2018 0600 Gross per 24 hour  Intake 680.47 ml  Output 1075 ml  Net -394.53 ml   Filed Weights   01/04/18 2050 01/05/18 2155 01/07/18 0500  Weight: 101.7 kg (224 lb 2.5 oz) 101.7 kg (224 lb 2.6 oz) 101.7 kg (224 lb 3.3 oz)     Exam:  General: NAD Cardiovascular: IRR Respiratory: CTA Gastrointestinal: Soft, nt, nd GU foley catheter in place    Data Reviewed: CBC: Recent Labs  Lab 01/03/18 0820 01/04/18 0644 01/05/18 0614 01/06/18 0801 01/07/18 0627  WBC 11.3* 14.3* 13.2* 9.9 9.6  HGB 10.1* 9.1* 8.8* 8.7* 8.5*  HCT 32.2* 29.6* 28.4* 27.8* 27.5*  MCV 97.0 96.7 96.9 97.9 96.8  PLT 181 190 182 217 326   Basic Metabolic Panel: Recent Labs  Lab 01/01/18 0545 01/02/18 0735 01/03/18 1411 01/04/18 0648 01/05/18 0614 01/07/18 0627  NA 143 143 143 141 142 140  K 3.1* 3.3* 3.2* 4.9 3.4* 4.1  CL 108 108 105 108 105 108  CO2 24 25 26 22 27 26   GLUCOSE 105* 102* 110* 83 91 82  BUN 49* 31* 17 12 11 7   CREATININE 1.74* 1.53* 1.34* 1.20* 1.33* 1.44*  CALCIUM 8.2* 8.1* 8.5* 8.1* 8.2* 8.4*  PHOS 1.8*  --   --   --   --   --    GFR: Estimated Creatinine Clearance: 46.7 mL/min (A) (by C-G formula based on SCr of 1.44 mg/dL (H)). Liver Function Tests: Recent Labs  Lab 01/01/18 0545  ALBUMIN 2.1*   No results for input(s): LIPASE, AMYLASE in the last 168 hours. No results for input(s): AMMONIA in the last 168 hours. Coagulation Profile: Recent Labs  Lab 01/03/18 0820 01/04/18 0644 01/05/18 0614 01/06/18 0801 01/07/18 0627  INR 1.35 1.63 1.58 1.76 1.90   Cardiac Enzymes: No results for input(s): CKTOTAL, CKMB, CKMBINDEX, TROPONINI in the last 168 hours. BNP (last 3 results) No results for input(s): PROBNP in the last 8760 hours. HbA1C: No results for input(s): HGBA1C in the last 72 hours. CBG: No results for input(s): GLUCAP in the last 168 hours. Lipid Profile: No results for input(s): CHOL, HDL, LDLCALC, TRIG, CHOLHDL, LDLDIRECT in the last 72 hours. Thyroid Function Tests: No results for input(s): TSH, T4TOTAL, FREET4, T3FREE, THYROIDAB in the last 72 hours. Anemia Panel: No results for input(s): VITAMINB12, FOLATE, FERRITIN, TIBC, IRON, RETICCTPCT in the last 72 hours. Urine  analysis:    Component Value Date/Time   COLORURINE AMBER (A) 12/29/2017 1311   APPEARANCEUR CLOUDY (A) 12/29/2017 1311   LABSPEC 1.010 12/29/2017 1311   PHURINE 8.0 12/29/2017 1311   GLUCOSEU NEGATIVE 12/29/2017 1311   GLUCOSEU NEGATIVE 11/10/2017 1216   HGBUR SMALL (A) 12/29/2017 1311   BILIRUBINUR NEGATIVE 12/29/2017 1311   KETONESUR NEGATIVE 12/29/2017 1311   PROTEINUR 100 (A) 12/29/2017 1311   UROBILINOGEN 0.2 11/10/2017 1216   NITRITE NEGATIVE 12/29/2017 1311   LEUKOCYTESUR LARGE (A) 12/29/2017 1311   Sepsis Labs: @LABRCNTIP (procalcitonin:4,lacticidven:4)  ) Recent Results (from the past 240 hour(s))  Urine Culture     Status: Abnormal   Collection Time: 12/29/17  1:11 PM  Result Value Ref Range Status   Specimen Description URINE, CATHETERIZED  Final   Special Requests   Final    NONE Performed at [facility], [place] [address], [place], [place] [address]    Culture >=100,000 COLONIES/mL CITROBACTER FREUNDII (A)  Final   Report Status 01/01/2018 FINAL  Final   Organism ID, Bacteria CITROBACTER FREUNDII (A)  Final  Susceptibility   Citrobacter freundii - MIC*    CEFAZOLIN RESISTANT Resistant     CEFTRIAXONE <=1 SENSITIVE Sensitive     CIPROFLOXACIN <=0.25 SENSITIVE Sensitive     GENTAMICIN <=1 SENSITIVE Sensitive     IMIPENEM <=0.25 SENSITIVE Sensitive     NITROFURANTOIN <=16 SENSITIVE Sensitive     TRIMETH/SULFA <=20 SENSITIVE Sensitive     PIP/TAZO <=4 SENSITIVE Sensitive     * >=100,000 COLONIES/mL CITROBACTER FREUNDII  Blood Culture (routine x 2)     Status: Abnormal   Collection Time: 12/29/17  1:20 PM  Result Value Ref Range Status   Specimen Description LEFT ANTECUBITAL  Final   Special Requests   Final    BOTTLES DRAWN AEROBIC AND ANAEROBIC Blood Culture adequate volume   Culture  Setup Time   Final    GRAM NEGATIVE RODS IN BOTH AEROBIC AND ANAEROBIC BOTTLES CRITICAL RESULT CALLED TO, READ BACK BY AND VERIFIED WITH: A MASTERS PHARMD 12/30/17  4098 JDW Performed at [facility], [place] [address] [place], [place] [address]    Culture ENTEROBACTER CLOACAE (A)  Final   Report Status 01/02/2018 FINAL  Final   Organism ID, Bacteria ENTEROBACTER CLOACAE  Final      Susceptibility   Enterobacter cloacae - MIC*    CEFAZOLIN >=64 RESISTANT Resistant     CEFEPIME <=1 SENSITIVE Sensitive     CEFTAZIDIME >=64 RESISTANT Resistant     CEFTRIAXONE 16 INTERMEDIATE Intermediate     CIPROFLOXACIN <=0.25 SENSITIVE Sensitive     GENTAMICIN <=1 SENSITIVE Sensitive     IMIPENEM <=0.25 SENSITIVE Sensitive     TRIMETH/SULFA <=20 SENSITIVE Sensitive     PIP/TAZO >=128 RESISTANT Resistant     * ENTEROBACTER CLOACAE  Blood Culture ID Panel (Reflexed)     Status: Abnormal   Collection Time: 12/29/17  1:20 PM  Result Value Ref Range Status   Enterococcus species NOT DETECTED NOT DETECTED Final   Listeria monocytogenes NOT DETECTED NOT DETECTED Final   Staphylococcus species NOT DETECTED NOT DETECTED Final   Staphylococcus aureus NOT DETECTED NOT DETECTED Final   Streptococcus species NOT DETECTED NOT DETECTED Final   Streptococcus agalactiae NOT DETECTED NOT DETECTED Final   Streptococcus pneumoniae NOT DETECTED NOT DETECTED Final   Streptococcus pyogenes NOT DETECTED NOT DETECTED Final   Acinetobacter baumannii NOT DETECTED NOT DETECTED Final   Enterobacteriaceae species DETECTED (A) NOT DETECTED Final    Comment: Enterobacteriaceae represent a large family of gram-negative bacteria, not a single organism. CRITICAL RESULT CALLED TO, READ BACK BY AND VERIFIED WITH: A MASTERS PHARMD 12/30/17 0605 JDW    Enterobacter cloacae complex DETECTED (A) NOT DETECTED Final    Comment: CRITICAL RESULT CALLED TO, READ BACK BY AND VERIFIED WITH: A MASTERS PHARMD 12/30/17 0605 JDW    Escherichia coli NOT DETECTED NOT DETECTED Final   Klebsiella oxytoca NOT DETECTED NOT DETECTED Final   Klebsiella pneumoniae NOT DETECTED NOT DETECTED Final   Proteus species  NOT DETECTED NOT DETECTED Final   Serratia marcescens NOT DETECTED NOT DETECTED Final   Carbapenem resistance NOT DETECTED NOT DETECTED Final   Haemophilus influenzae NOT DETECTED NOT DETECTED Final   Neisseria meningitidis NOT DETECTED NOT DETECTED Final   Pseudomonas aeruginosa NOT DETECTED NOT DETECTED Final   Candida albicans NOT DETECTED NOT DETECTED Final   Candida glabrata NOT DETECTED NOT DETECTED Final   Candida krusei NOT DETECTED NOT DETECTED Final   Candida parapsilosis NOT DETECTED NOT DETECTED Final   Candida tropicalis  NOT DETECTED NOT DETECTED Final  Blood Culture (routine x 2)     Status: None   Collection Time: 12/29/17  3:00 PM  Result Value Ref Range Status   Specimen Description BLOOD LEFT ARM  Final   Special Requests   Final    BOTTLES DRAWN AEROBIC AND ANAEROBIC Blood Culture adequate volume   Culture   Final    NO GROWTH 5 DAYS Performed at [facility], [address], [place], [place] [address]    Report Status 01/03/2018 FINAL  Final  Culture, blood (Routine X 2) w Reflex to ID Panel     Status: None   Collection Time: 12/30/17  2:45 PM  Result Value Ref Range Status   Specimen Description BLOOD LEFT HAND  Final   Special Requests   Final    BOTTLES DRAWN AEROBIC AND ANAEROBIC Blood Culture results may not be optimal due to an inadequate volume of blood received in culture bottles   Culture   Final    NO GROWTH 5 DAYS Performed at [facility], [place] [address], [place], [place] [address]    Report Status 01/04/2018 FINAL  Final  Culture, blood (Routine X 2) w Reflex to ID Panel     Status: None   Collection Time: 12/30/17  3:00 PM  Result Value Ref Range Status   Specimen Description BLOOD RIGHT HAND  Final   Special Requests   Final    BOTTLES DRAWN AEROBIC AND ANAEROBIC Blood Culture results may not be optimal due to an inadequate volume of blood received in culture bottles   Culture   Final    NO GROWTH 5 DAYS Performed at [facility], [place] [address] [place], [place] [address]    Report Status 01/04/2018 FINAL  Final  C difficile quick scan w PCR reflex     Status: None   Collection Time: 12/31/17 10:30 AM  Result Value Ref Range Status   C Diff antigen NEGATIVE NEGATIVE Final   C Diff toxin NEGATIVE NEGATIVE Final   C Diff interpretation No C. difficile detected.  Final    Comment: Performed at [facility], [place] [address], [place], [place] [address]      Studies: No results found.  Scheduled Meds: . amiodarone  200 mg Oral Daily  . carvedilol  3.125 mg Oral BID  . ciprofloxacin  500 mg Oral BID  . famotidine  20 mg Oral QHS  . isosorbide-hydrALAZINE  1 tablet Oral TID  . ranolazine  500 mg Oral BID  . tamsulosin  0.4 mg Oral QPC supper  . warfarin  5 mg Oral ONCE-1800  . Warfarin - Pharmacist Dosing Inpatient   Does not apply q1800    Continuous Infusions: . heparin 1,900 Units/hr (01/07/18 0212)     LOS: 9 days     Elmarie Shiley, MD [facility] Pager [phone]  If 7PM-7AM, please contact night-coverage [email or web address] Password [facility] 01/07/2018, 2:46 PM

## 2018-01-07 NOTE — Discharge Instructions (Addendum)
Please make sure that you follow up with Heart failure Dr. Cardiology office will call you with appointment, if you dont hear from them, please call the office.  You need Your Kidney function check On Monday and your coumadin level check on Monday     Information on my medicine - Coumadin   (Warfarin)  Why was Coumadin prescribed for you? Coumadin was prescribed for you because you have a blood clot or a medical condition that can cause an increased risk of forming blood clots. Blood clots can cause serious health problems by blocking the flow of blood to the heart, lung, or brain. Coumadin can prevent harmful blood clots from forming. As a reminder your indication for Coumadin is:   Select from menu  What test will check on my response to Coumadin? While on Coumadin (warfarin) you will need to have an INR test regularly to ensure that your dose is keeping you in the desired range. The INR (international normalized ratio) number is calculated from the result of the laboratory test called prothrombin time (PT).  If an INR APPOINTMENT HAS NOT ALREADY BEEN MADE FOR YOU please schedule an appointment to have this lab work done by your health care provider within 7 days. Your INR goal is usually a number between:  2 to 3 or your provider may give you a more narrow range like 2-2.5.  Ask your health care provider during an office visit what your goal INR is.  What  do you need to  know  About  COUMADIN? Take Coumadin (warfarin) exactly as prescribed by your healthcare provider about the same time each day.  DO NOT stop taking without talking to the doctor who prescribed the medication.  Stopping without other blood clot prevention medication to take the place of Coumadin may increase your risk of developing a new clot or stroke.  Get refills before you run out.  What do you do if you miss a dose? If you miss a dose, take it as soon as you remember on the same day then continue your regularly  scheduled regimen the next day.  Do not take two doses of Coumadin at the same time.  Important Safety Information A possible side effect of Coumadin (Warfarin) is an increased risk of bleeding. You should call your healthcare provider right away if you experience any of the following: ? Bleeding from an injury or your nose that does not stop. ? Unusual colored urine (red or dark brown) or unusual colored stools (red or black). ? Unusual bruising for unknown reasons. ? A serious fall or if you hit your head (even if there is no bleeding).  Some foods or medicines interact with Coumadin (warfarin) and might alter your response to warfarin. To help avoid this: ? Eat a balanced diet, maintaining a consistent amount of Vitamin K. ? Notify your provider about major diet changes you plan to make. ? Avoid alcohol or limit your intake to 1 drink for women and 2 drinks for men per day. (1 drink is 5 oz. wine, 12 oz. beer, or 1.5 oz. liquor.)  Make sure that ANY health care provider who prescribes medication for you knows that you are taking Coumadin (warfarin).  Also make sure the healthcare provider who is monitoring your Coumadin knows when you have started a new medication including herbals and non-prescription products.  Coumadin (Warfarin)  Major Drug Interactions  Increased Warfarin Effect Decreased Warfarin Effect  Alcohol (large quantities) Antibiotics (esp. Septra/Bactrim, Flagyl, Cipro)  Amiodarone (Cordarone) Aspirin (ASA) Cimetidine (Tagamet) Megestrol (Megace) NSAIDs (ibuprofen, naproxen, etc.) Piroxicam (Feldene) Propafenone (Rythmol SR) Propranolol (Inderal) Isoniazid (INH) Posaconazole (Noxafil) Barbiturates (Phenobarbital) Carbamazepine (Tegretol) Chlordiazepoxide (Librium) Cholestyramine (Questran) Griseofulvin Oral Contraceptives Rifampin Sucralfate (Carafate) Vitamin K   Coumadin (Warfarin) Major Herbal Interactions  Increased Warfarin Effect Decreased Warfarin  Effect  Garlic Ginseng Ginkgo biloba Coenzyme Q10 Green tea St. Johns wort    Coumadin (Warfarin) FOOD Interactions  Eat a consistent number of servings per week of foods HIGH in Vitamin K (1 serving =  cup)  Collards (cooked, or boiled & drained) Kale (cooked, or boiled & drained) Mustard greens (cooked, or boiled & drained) Parsley *serving size only =  cup Spinach (cooked, or boiled & drained) Swiss chard (cooked, or boiled & drained) Turnip greens (cooked, or boiled & drained)  Eat a consistent number of servings per week of foods MEDIUM-HIGH in Vitamin K (1 serving = 1 cup)  Asparagus (cooked, or boiled & drained) Broccoli (cooked, boiled & drained, or raw & chopped) Brussel sprouts (cooked, or boiled & drained) *serving size only =  cup Lettuce, raw (green leaf, endive, romaine) Spinach, raw Turnip greens, raw & chopped   These websites have more information on Coumadin (warfarin):  FailFactory.se; VeganReport.com.au;

## 2018-01-08 ENCOUNTER — Telehealth: Payer: Self-pay | Admitting: Cardiology

## 2018-01-08 LAB — CBC
HCT: 26.8 % — ABNORMAL LOW (ref 36.0–46.0)
HEMOGLOBIN: 8.4 g/dL — AB (ref 12.0–15.0)
MCH: 30.5 pg (ref 26.0–34.0)
MCHC: 31.3 g/dL (ref 30.0–36.0)
MCV: 97.5 fL (ref 78.0–100.0)
Platelets: 303 10*3/uL (ref 150–400)
RBC: 2.75 MIL/uL — AB (ref 3.87–5.11)
RDW: 16.3 % — ABNORMAL HIGH (ref 11.5–15.5)
WBC: 9.1 10*3/uL (ref 4.0–10.5)

## 2018-01-08 LAB — PROTIME-INR
INR: 1.92
Prothrombin Time: 21.8 seconds — ABNORMAL HIGH (ref 11.4–15.2)

## 2018-01-08 LAB — HEPARIN LEVEL (UNFRACTIONATED): Heparin Unfractionated: 0.52 IU/mL (ref 0.30–0.70)

## 2018-01-08 MED ORDER — ENOXAPARIN SODIUM 100 MG/ML ~~LOC~~ SOLN
100.0000 mg | Freq: Once | SUBCUTANEOUS | Status: AC
  Administered 2018-01-08: 100 mg via SUBCUTANEOUS
  Filled 2018-01-08: qty 1

## 2018-01-08 MED ORDER — CIPROFLOXACIN HCL 500 MG PO TABS: 500.0000 mg | ORAL_TABLET | Freq: Two times a day (BID) | ORAL | 0 refills | Status: DC

## 2018-01-08 MED ORDER — WARFARIN SODIUM 5 MG PO TABS: 5.0000 mg | ORAL_TABLET | Freq: Every day | ORAL | 0 refills | Status: DC

## 2018-01-08 MED ORDER — FUROSEMIDE 40 MG PO TABS: 40.0000 mg | ORAL_TABLET | Freq: Every day | ORAL | 0 refills | Status: DC

## 2018-01-08 NOTE — Telephone Encounter (Signed)
Patient calling to see if she can be worked in for coumadin appt on Monday, 01-11-18? Patient did not want to come Tuesday,states that she is being discharged from [facility] and was told to come Monday.

## 2018-01-08 NOTE — Telephone Encounter (Signed)
Please add patient to schedule 2/18 at 11:45am  Thanks

## 2018-01-08 NOTE — Discharge Summary (Signed)
Physician Discharge Summary  Patricia Wagner WGY:[id number] DOB: [birth year] DOA: 12/29/2017  PCP: Biagio Borg, MD  Admit date: 12/29/2017 Discharge date: 01/08/2018  Admitted From: Home  Disposition: Home.   Recommendations for Outpatient Follow-up:  1. Follow up with PCP in 1-2 weeks 2. Please obtain BMP/CBC in one week 3. Needs B-met to follow renal function, now that she will be back on research drug.  4. Resume diuretic as needed.  5. Needs follow up with urology   Home Health: no  Discharge Condition: stable.  CODE STATUS: full code.  Diet recommendation: Heart Healthy  Brief/Interim Summary: Patricia Wagner is a 67 y.o. year old female with medical history significant for atrial fibrillation on Coumadin, chronic systolic congestive heart failure, CAD, hypertension, hyperlipidemia, CKD stage III, previous CVA, COPD, reknot syndrome who presented on 12/29/2017 with weakness and slurred speech and was found to have AKI on CKD with anion gap metabolic acidosis concerning for obstructive uropathy, gram-negative bacteremia and supratherapeutic INR.   Subjective Patient denies dyspnea, no chest pain   Assessment/Plan: Principal Problem:   Acute renal failure superimposed on chronic kidney disease ([place]) Active Problems:   Essential hypertension   Paroxysmal atrial fibrillation (HCC)   Urinary retention   Anemia   Supratherapeutic INR   Acute UTI   Bacteremia due to Gram-negative bacteria   Increased anion gap metabolic acidosis  1-Enterobacter bacteremia  Citrobacter UTI, stable Risk factor prior to admission: in and out catheter use Transitioned from IV cefepime (3 days) to Cipro on 2/8 (curbsided ID who rec'd bactrim but will interact with warfarin).   Repeat blood cultures have remained negative consistent with clearance  Plan for 2 total weeks of antibiotic therapy given bacteremia Day 10. Will provide 4 more days.   AKI on CKD stage III with anion gap metabolic  acidosis, resolved Obstructive uropathy, unclear etiology Creatinine back to baseline.   With nephrology assistance improved with Foley catheter and IV fluids Peak creatinine 7.15 during admission,  baseline 1.2-1.4. Suspect kidney dysfunction was multifactorial etiology related to acute urinary retention and UTI.  -will need to be discharge with foley, close follow up with urology . Second admission for renal failure due to retension.  -Will still need outpatient urologic evaluation for urodynamic studies once over acute illness. -Instructed patient to reschedule urology appointment -Continue Flomax Cr stable 1.4.   Chronic atrial fibrillation, rate controlled complicated by supratherapeutic INR, resolved INR still subtherapeutic, currently 1.58 today.   Continue heparin bridge to Coumadin, follow pharmacy protocol.   -Continue home amiodarone.   -INR 1.9. Will give one dose of lovenox. Discharge on 5 mg daily. Patient does not wants to remain in the hospital for heparin.   Anemia of chronic disease, stable Baseline hemoglobin range 9-10.   Hemoglobin remains stable.  Hematuria has essentially resolved. No other signs of bleeding. -Continue to monitor on CBC  Chronic systolic heart failure, nonischemic cardiomyopathy, euvolemic TTE on 06/2017: EF 30-35% - Continue carvedilol, Ranexa, bidil.   -Will hold off on starting any home diuretics (Lasix and spironolactone) in light of AK I on CKD Weight stable last days. Good urine out put. Negative 5 L.  Close follow up with HF clinic.  Discussed with Dr Algernon Huxley, resume research drug.   Hypokalemia;  Repleted      Discharge Diagnoses:  Principal Problem:   Acute UTI Active Problems:   Essential hypertension   Acute renal failure superimposed on chronic kidney disease (HCC)   Paroxysmal atrial fibrillation (  [place])   Urinary retention   Anemia   Supratherapeutic INR   Bacteremia due to Gram-negative bacteria   Increased  anion gap metabolic acidosis    Discharge Instructions  Discharge Instructions    Diet - low sodium heart healthy   Complete by:  As directed    Diet - low sodium heart healthy   Complete by:  As directed    Diet - low sodium heart healthy   Complete by:  As directed    Increase activity slowly   Complete by:  As directed    Increase activity slowly   Complete by:  As directed    Increase activity slowly   Complete by:  As directed      Allergies as of 01/08/2018      Reactions   Ace Inhibitors Palpitations      Medication List    STOP taking these medications   furosemide 40 MG tablet Commonly known as:  LASIX   sacubitril-valsartan 49-51 MG Commonly known as:  ENTRESTO   spironolactone 25 MG tablet Commonly known as:  ALDACTONE     TAKE these medications   acetaminophen 325 MG tablet Commonly known as:  TYLENOL Take 650 mg by mouth every 6 (six) hours as needed (for pain).   amiodarone 200 MG tablet Commonly known as:  PACERONE TAKE 1 TABLET BY MOUTH EVERY DAY What changed:    how much to take  how to take this  when to take this   carvedilol 3.125 MG tablet Commonly known as:  COREG Take 1 tablet (3.125 mg total) by mouth 2 (two) times daily.   ciprofloxacin 500 MG tablet Commonly known as:  CIPRO Take 1 tablet (500 mg total) by mouth 2 (two) times daily.   Investigational - Study Medication Take 1 tablet by mouth 2 (two) times daily. Study name: Galactic HF Study  Additional study details: Omecamtiv Mecarbil or Placebo   isosorbide-hydrALAZINE 20-37.5 MG tablet Commonly known as:  BIDIL Take 1 tablet by mouth 3 (three) times daily. What changed:  how much to take   ranolazine 500 MG 12 hr tablet Commonly known as:  RANEXA Take 1 tablet (500 mg total) by mouth 2 (two) times daily.   rosuvastatin 10 MG tablet Commonly known as:  CRESTOR TAKE 1 TABLET (10 MG TOTAL) BY MOUTH DAILY. What changed:  when to take this   tamsulosin 0.4 MG  Caps capsule Commonly known as:  FLOMAX Take 1 capsule (0.4 mg total) by mouth daily.   warfarin 5 MG tablet Commonly known as:  COUMADIN Take as directed. If you are unsure how to take this medication, talk to your nurse or doctor. Original instructions:  Take 1 tablet (5 mg total) by mouth daily at 6 PM. What changed:    medication strength  how much to take      Follow-up Information    Nickie Retort, MD Follow up in 1 week(s).   Specialty:  Urology Contact information: [address] [place] [address] [phone]          Allergies  Allergen Reactions  . Ace Inhibitors Palpitations    Consultations:  Nephrology    Procedures/Studies: Ct Head Wo Contrast  Result Date: 12/29/2017 CLINICAL DATA:  Slurred speech and generalized weakness for 4 days. EXAM: CT HEAD WITHOUT CONTRAST TECHNIQUE: Contiguous axial images were obtained from the base of the skull through the vertex without intravenous contrast. COMPARISON:  04/29/2017 and prior CTs FINDINGS: Brain: No  evidence of acute infarction, hemorrhage, hydrocephalus, extra-axial collection or mass lesion/mass effect. Mild atrophy and remote left cerebellar and posterior right parietal infarcts again noted. Vascular: Atherosclerotic calcifications identified. Skull: Normal. Negative for fracture or focal lesion. Sinuses/Orbits: No acute abnormality Other: None IMPRESSION: 1. No evidence of acute intracranial abnormality 2. Remote left cerebellar and posterior right parietal infarcts. Electronically Signed   By: Margarette Canada M.D.   On: 12/29/2017 16:21   Dg Chest Port 1 View  Result Date: 12/29/2017 CLINICAL DATA:  Fever and weakness EXAM: PORTABLE CHEST 1 VIEW COMPARISON:  11/30/2017 FINDINGS: Cardiac shadow is stable. Aortic calcifications are noted. Lungs are well aerated bilaterally. Degenerative changes of the thoracic spine are seen. IMPRESSION: No acute abnormality noted. Electronically Signed   By: Inez Catalina  M.D.   On: 12/29/2017 12:17      Subjective: She is feeling better.   Discharge Exam: Vitals:   01/08/18 0520 01/08/18 0954  BP: (!) 125/59 (!) 93/49  Pulse: 65 77  Resp: 19 18  Temp: 98.6 F (37 C) 98.9 F (37.2 C)  SpO2: 97% 100%   Vitals:   01/07/18 1633 01/07/18 2053 01/08/18 0520 01/08/18 0954  BP: (!) 114/59 (!) 99/51 (!) 125/59 (!) 93/49  Pulse: (!) 57 (!) 59 65 77  Resp: 16 18 19 18   Temp: 98.2 F (36.8 C) 97.6 F (36.4 C) 98.6 F (37 C) 98.9 F (37.2 C)  TempSrc: Oral Oral Oral Oral  SpO2: 93% 93% 97% 100%  Weight:  101.5 kg (223 lb 12.3 oz)    Height:        General: Pt is alert, awake, not in acute distress Cardiovascular: RRR, S1/S2 +, no rubs, no gallops Respiratory: CTA bilaterally, no wheezing, no rhonchi Abdominal: Soft, NT, ND, bowel sounds + Extremities: no edema, no cyanosis    The results of significant diagnostics from this hospitalization (including imaging, microbiology, ancillary and laboratory) are listed below for reference.     Microbiology: Recent Results (from the past 240 hour(s))  Blood Culture (routine x 2)     Status: None   Collection Time: 12/29/17  3:00 PM  Result Value Ref Range Status   Specimen Description BLOOD LEFT ARM  Final   Special Requests   Final    BOTTLES DRAWN AEROBIC AND ANAEROBIC Blood Culture adequate volume   Culture   Final    NO GROWTH 5 DAYS Performed at [facility], [address], [place], [place] [address]    Report Status 01/03/2018 FINAL  Final  Culture, blood (Routine X 2) w Reflex to ID Panel     Status: None   Collection Time: 12/30/17  2:45 PM  Result Value Ref Range Status   Specimen Description BLOOD LEFT HAND  Final   Special Requests   Final    BOTTLES DRAWN AEROBIC AND ANAEROBIC Blood Culture results may not be optimal due to an inadequate volume of blood received in culture bottles   Culture   Final    NO GROWTH 5 DAYS Performed at [facility], [place] [address]  [place], [place] [address]    Report Status 01/04/2018 FINAL  Final  Culture, blood (Routine X 2) w Reflex to ID Panel     Status: None   Collection Time: 12/30/17  3:00 PM  Result Value Ref Range Status   Specimen Description BLOOD RIGHT HAND  Final   Special Requests   Final    BOTTLES DRAWN AEROBIC AND ANAEROBIC  Blood Culture results may not be optimal due to an inadequate volume of blood received in culture bottles   Culture   Final    NO GROWTH 5 DAYS Performed at [facility] [address], [place], [place] [address]    Report Status 01/04/2018 FINAL  Final  C difficile quick scan w PCR reflex     Status: None   Collection Time: 12/31/17 10:30 AM  Result Value Ref Range Status   C Diff antigen NEGATIVE NEGATIVE Final   C Diff toxin NEGATIVE NEGATIVE Final   C Diff interpretation No C. difficile detected.  Final    Comment: Performed at [facility], [place] [address], [place], [place] [address]     Labs: BNP (last 3 results) Recent Labs    04/29/17 0935  BNP 0,981.1*   Basic Metabolic Panel: Recent Labs  Lab 01/02/18 0735 01/03/18 1411 01/04/18 0648 01/05/18 0614 01/07/18 0627  NA 143 143 141 142 140  K 3.3* 3.2* 4.9 3.4* 4.1  CL 108 105 108 105 108  CO2 25 26 22 27 26   GLUCOSE 102* 110* 83 91 82  BUN 31* 17 12 11 7   CREATININE 1.53* 1.34* 1.20* 1.33* 1.44*  CALCIUM 8.1* 8.5* 8.1* 8.2* 8.4*   Liver Function Tests: No results for input(s): AST, ALT, ALKPHOS, BILITOT, PROT, ALBUMIN in the last 168 hours. No results for input(s): LIPASE, AMYLASE in the last 168 hours. No results for input(s): AMMONIA in the last 168 hours. CBC: Recent Labs  Lab 01/04/18 0644 01/05/18 0614 01/06/18 0801 01/07/18 0627 01/08/18 0637  WBC 14.3* 13.2* 9.9 9.6 9.1  HGB 9.1* 8.8* 8.7* 8.5* 8.4*  HCT 29.6* 28.4* 27.8* 27.5* 26.8*  MCV 96.7 96.9 97.9 96.8 97.5  PLT 190 182 217 264 303   Cardiac Enzymes: No results for input(s): CKTOTAL, CKMB, CKMBINDEX, TROPONINI in  the last 168 hours. BNP: Invalid input(s): POCBNP CBG: No results for input(s): GLUCAP in the last 168 hours. D-Dimer No results for input(s): DDIMER in the last 72 hours. Hgb A1c No results for input(s): HGBA1C in the last 72 hours. Lipid Profile No results for input(s): CHOL, HDL, LDLCALC, TRIG, CHOLHDL, LDLDIRECT in the last 72 hours. Thyroid function studies No results for input(s): TSH, T4TOTAL, T3FREE, THYROIDAB in the last 72 hours.  Invalid input(s): FREET3 Anemia work up No results for input(s): VITAMINB12, FOLATE, FERRITIN, TIBC, IRON, RETICCTPCT in the last 72 hours. Urinalysis    Component Value Date/Time   COLORURINE AMBER (A) 12/29/2017 1311   APPEARANCEUR CLOUDY (A) 12/29/2017 1311   LABSPEC 1.010 12/29/2017 1311   PHURINE 8.0 12/29/2017 1311   GLUCOSEU NEGATIVE 12/29/2017 1311   GLUCOSEU NEGATIVE 11/10/2017 1216   HGBUR SMALL (A) 12/29/2017 1311   BILIRUBINUR NEGATIVE 12/29/2017 1311   KETONESUR NEGATIVE 12/29/2017 1311   PROTEINUR 100 (A) 12/29/2017 1311   UROBILINOGEN 0.2 11/10/2017 1216   NITRITE NEGATIVE 12/29/2017 1311   LEUKOCYTESUR LARGE (A) 12/29/2017 1311   Sepsis Labs Invalid input(s): PROCALCITONIN,  WBC,  LACTICIDVEN Microbiology Recent Results (from the past 240 hour(s))  Blood Culture (routine x 2)     Status: None   Collection Time: 12/29/17  3:00 PM  Result Value Ref Range Status   Specimen Description BLOOD LEFT ARM  Final   Special Requests   Final    BOTTLES DRAWN AEROBIC AND ANAEROBIC Blood Culture adequate volume   Culture   Final    NO GROWTH 5 DAYS Performed at [facility], [place] [address], [place], [place] [address]    Report Status 01/03/2018 FINAL  Final  Culture, blood (Routine X 2) w Reflex to ID Panel     Status: None   Collection Time: 12/30/17  2:45 PM  Result Value Ref Range Status   Specimen Description BLOOD LEFT HAND  Final   Special Requests   Final    BOTTLES DRAWN AEROBIC AND ANAEROBIC Blood Culture  results may not be optimal due to an inadequate volume of blood received in culture bottles   Culture   Final    NO GROWTH 5 DAYS Performed at [facility], [place] [address] [place], [place] [address]    Report Status 01/04/2018 FINAL  Final  Culture, blood (Routine X 2) w Reflex to ID Panel     Status: None   Collection Time: 12/30/17  3:00 PM  Result Value Ref Range Status   Specimen Description BLOOD RIGHT HAND  Final   Special Requests   Final    BOTTLES DRAWN AEROBIC AND ANAEROBIC Blood Culture results may not be optimal due to an inadequate volume of blood received in culture bottles   Culture   Final    NO GROWTH 5 DAYS Performed at [facility], [place] [address] [place], [place] [address]    Report Status 01/04/2018 FINAL  Final  C difficile quick scan w PCR reflex     Status: None   Collection Time: 12/31/17 10:30 AM  Result Value Ref Range Status   C Diff antigen NEGATIVE NEGATIVE Final   C Diff toxin NEGATIVE NEGATIVE Final   C Diff interpretation No C. difficile detected.  Final    Comment: Performed at [facility], [place] [address] [place], [place] [address]     Time coordinating discharge: Over 30 minutes  SIGNED:   Elmarie Shiley, MD  [facility] 01/08/2018, 1:45 PM Pager   If 7PM-7AM, please contact night-coverage [email or web address] Password [email or web address]

## 2018-01-08 NOTE — Progress Notes (Signed)
Patient Discharge: Disposition: Patient discharged to home. Education: Reviewed medications, prescriptions, follow-up appointments and discharge instructions, verbalized understanding. IV: Discontinued IV before discharge. Telemetry: N/A. Transportation: Patient escorted out of the unit in w/c accompanied with the daughter. Belongings: Patient took all her belongings with her.  Patient left with Foley catheter.  Educated her regarding the hygiene, verbalized understanding.

## 2018-01-11 ENCOUNTER — Ambulatory Visit (INDEPENDENT_AMBULATORY_CARE_PROVIDER_SITE_OTHER): Payer: BLUE CROSS/BLUE SHIELD | Admitting: Pharmacist

## 2018-01-11 DIAGNOSIS — Z7901 Long term (current) use of anticoagulants: Secondary | ICD-10-CM

## 2018-01-11 LAB — POCT INR: INR: 2.8

## 2018-01-20 ENCOUNTER — Encounter (HOSPITAL_COMMUNITY): Payer: Self-pay

## 2018-01-20 ENCOUNTER — Ambulatory Visit (HOSPITAL_COMMUNITY)
Admission: RE | Admit: 2018-01-20 | Discharge: 2018-01-20 | Disposition: A | Discharge status: Home / Self Care | Payer: BLUE CROSS/BLUE SHIELD | Source: Ambulatory Visit | Attending: Internal Medicine | Admitting: Internal Medicine

## 2018-01-20 VITALS — BP 176/102 | HR 68 | Wt 229.2 lb

## 2018-01-20 DIAGNOSIS — Z87891 Personal history of nicotine dependence: Secondary | ICD-10-CM | POA: Insufficient documentation

## 2018-01-20 DIAGNOSIS — I110 Hypertensive heart disease with heart failure: Secondary | ICD-10-CM | POA: Diagnosis not present

## 2018-01-20 DIAGNOSIS — I428 Other cardiomyopathies: Secondary | ICD-10-CM | POA: Diagnosis not present

## 2018-01-20 DIAGNOSIS — I480 Paroxysmal atrial fibrillation: Secondary | ICD-10-CM | POA: Diagnosis not present

## 2018-01-20 DIAGNOSIS — E785 Hyperlipidemia, unspecified: Secondary | ICD-10-CM | POA: Diagnosis not present

## 2018-01-20 DIAGNOSIS — Z79899 Other long term (current) drug therapy: Secondary | ICD-10-CM | POA: Diagnosis not present

## 2018-01-20 DIAGNOSIS — I7300 Raynaud's syndrome without gangrene: Secondary | ICD-10-CM | POA: Diagnosis not present

## 2018-01-20 DIAGNOSIS — N183 Chronic kidney disease, stage 3 unspecified: Secondary | ICD-10-CM

## 2018-01-20 DIAGNOSIS — Z7901 Long term (current) use of anticoagulants: Secondary | ICD-10-CM | POA: Insufficient documentation

## 2018-01-20 DIAGNOSIS — I5022 Chronic systolic (congestive) heart failure: Secondary | ICD-10-CM | POA: Diagnosis not present

## 2018-01-20 DIAGNOSIS — I481 Persistent atrial fibrillation: Secondary | ICD-10-CM | POA: Insufficient documentation

## 2018-01-20 DIAGNOSIS — I509 Heart failure, unspecified: Secondary | ICD-10-CM | POA: Diagnosis present

## 2018-01-20 DIAGNOSIS — I2510 Atherosclerotic heart disease of native coronary artery without angina pectoris: Secondary | ICD-10-CM | POA: Insufficient documentation

## 2018-01-20 DIAGNOSIS — N390 Urinary tract infection, site not specified: Secondary | ICD-10-CM | POA: Diagnosis not present

## 2018-01-20 DIAGNOSIS — N179 Acute kidney failure, unspecified: Secondary | ICD-10-CM | POA: Diagnosis not present

## 2018-01-20 DIAGNOSIS — E559 Vitamin D deficiency, unspecified: Secondary | ICD-10-CM | POA: Diagnosis not present

## 2018-01-20 DIAGNOSIS — Z8673 Personal history of transient ischemic attack (TIA), and cerebral infarction without residual deficits: Secondary | ICD-10-CM | POA: Insufficient documentation

## 2018-01-20 DIAGNOSIS — J449 Chronic obstructive pulmonary disease, unspecified: Secondary | ICD-10-CM | POA: Insufficient documentation

## 2018-01-20 DIAGNOSIS — R29818 Other symptoms and signs involving the nervous system: Secondary | ICD-10-CM | POA: Diagnosis not present

## 2018-01-20 MED ORDER — FUROSEMIDE 20 MG PO TABS: ORAL_TABLET | ORAL | 11 refills | Status: DC

## 2018-01-20 NOTE — Progress Notes (Signed)
Advanced Heart Failure Clinic Note    Primary Cardiologist: Dr. [name] Primary HF: [name]  HPI: Patricia Wagner is a 67 y.o. female 67 year old female with a past medical history of persistent atrial fibrillation, NICM cardiomyopathy, Biventricular CHF (LVEF 15% and RV severely reduced by TEE 03/25/17), HTN, and HLD.   She has struggled with symptomatic afib since 10/2016. Had successful DCCV 10/2016. Failed DCCV 03/2017, after stopping her amiodarone. Seen in A-Fib clinic 04/09/17 and planned for repeat DCCV after further amio load.   Pt admitted to [facility] 6/6 - 05/16/17 with cardiogenic shock in the setting of persistent AF. EF 15% with severe biventricular dysfunction. Started on amio gtt and PICC line placed for dobutamine with low output HF. Underwent DCCV 05/06/17 but went back into Afib 05/08/17 and developed recurrent cardiogenic shock, requiring re-initiation of dobutamine and amio. Pt had repeat DCCV 05/12/17 with successful conversion to NSR. Amio gtt continued to ensure adequate load. Transitioned to po 05/15/17. Ranexa also added to her regimen for added anti-arrhythmic benefit. HF meds optimized as tolerated.  PT recommended SNF, but pt preferred to return home with HHPT.   Last echocardiogram August 2018 showed ejection fraction 30-35%,severe left ventricular hypertrophy, mild to moderate mitral regurgitation, biatrial enlargement and moderately reduced RV function. Fat pad bx negative. Nuclear study 10/18 felt equivocal but not favored for amyloid. Patient has been scheduled for atrial fibrillation ablation January 2019 as she has had cardiogenic shock with atrial fibrillation previously.   Admitted 2/5 through 01/08/2018 with UTI and AKI. Creatinine peaked at 7. Lasix, spiro, and entresto stopped. Required home IV antibiotics and discharged with foley. At the time of discharge creatinine was down to 1.07.   Today she returns for HF follow up. Overall feeling fine. Denies  SOB/PND/Orthopnea. Appetite ok. No fever or chills. Weight at home has been stable at 227-228 pounds. She has noticed increased leg edema.  No problems with foley catheter. Taking all medications.    Echo from 07/15/17 EF 30-35% Severe LVH. RV down. Personally reviewed     Past Medical History:  Diagnosis Date  . Arthritis   . Atrial fibrillation ([place])    a. s/p multiple cardioversions; failed tikosyn/sotalol.  Marland Kitchen CAD in native artery, s/p cardiac cath with non obstructive CAD 10/24/2016  . CHF (congestive heart failure) ([place])   . DIVERTICULITIS, HX OF 07/25/2007  . DIVERTICULOSIS, COLON 07/22/2007  . Dyspnea   . Edema, peripheral    a. chronic BLE edema, R>L. Prior trauma from dog attack and accident.  Marland Kitchen HYPERLIPIDEMIA 02/03/2008  . Hypersomnia    declines w/u  . Hypertension   . MENOPAUSAL DISORDER 01/09/2011  . Morbid obesity ([place]) 07/22/2007  . NICM (nonischemic cardiomyopathy) ([place]) 10/24/2016  . Raynaud's syndrome 07/22/2007  . Stroke ([place]) 2017  . THYROID NODULE, RIGHT 01/04/2010  . VITAMIN D DEFICIENCY 01/09/2011   Qualifier: Diagnosis of  By: [name], [name]     Current Outpatient Medications  Medication Sig Dispense Refill  . acetaminophen (TYLENOL) 325 MG tablet Take 650 mg by mouth every 6 (six) hours as needed (for pain).     . carvedilol (COREG) 3.125 MG tablet Take 1 tablet (3.125 mg total) by mouth 2 (two) times daily. 180 tablet 3  . Investigational - Study Medication Take 1 tablet by mouth 2 (two) times daily. Study name: Galactic HF Study  Additional study details: Omecamtiv Mecarbil or Placebo 1 each PRN  . isosorbide-hydrALAZINE (BIDIL) 20-37.5 MG tablet Take 1 tablet  by mouth 3 (three) times daily. (Patient taking differently: Take 2 tablets by mouth 3 (three) times daily. ) 180 tablet 11  . ranolazine (RANEXA) 500 MG 12 hr tablet Take 1 tablet (500 mg total) by mouth 2 (two) times daily. 60 tablet 5  . rosuvastatin (CRESTOR) 10 MG tablet TAKE 1 TABLET (10 MG  TOTAL) BY MOUTH DAILY. (Patient taking differently: Take 10 mg by mouth at bedtime. ) 90 tablet 2  . tamsulosin (FLOMAX) 0.4 MG CAPS capsule Take 1 capsule (0.4 mg total) by mouth daily. 30 capsule 1  . warfarin (COUMADIN) 5 MG tablet Take 1 tablet (5 mg total) by mouth daily at 6 PM. 30 tablet 0   No current facility-administered medications for this encounter.     Allergies  Allergen Reactions  . Ace Inhibitors Palpitations     Social History   Socioeconomic History  . Marital status: Single    Spouse name: Not on file  . Number of children: 1  . Years of education: Not on file  . Highest education level: Not on file  Social Needs  . Financial resource strain: Not on file  . Food insecurity - worry: Not on file  . Food insecurity - inability: Not on file  . Transportation needs - medical: Not on file  . Transportation needs - non-medical: Not on file  Occupational History  . Occupation: Academic librarian   Tobacco Use  . Smoking status: Former Smoker    Packs/day: 0.50    Types: Cigarettes    Last attempt to quit: 10/23/2016    Years since quitting: 1.2  . Smokeless tobacco: Never Used  . Tobacco comment: Smoked for 50 years  Substance and Sexual Activity  . Alcohol use: No    Alcohol/week: 2.4 oz    Types: 4 Cans of beer per week    Frequency: Never    Comment: Intermittent  . Drug use: No  . Sexual activity: Not on file  Other Topics Concern  . Not on file  Social History Narrative  . Not on file     Family History  Problem Relation Age of Onset  . Asthma Mother   . Diabetes Father   . Heart disease Father        Died of presumed heart attack - 51s  . Lung disease Sister   . Heart disease Sister        Twin sister has heart issue, unclear what kind  . Thyroid disease Neg Hx     Vitals:   01/20/18 1440  BP: (!) 176/102  Pulse: 68  SpO2: 98%  Weight: 229 lb 3.2 oz (104 kg)    Wt Readings from Last 3 Encounters:  01/20/18 229 lb 3.2 oz (104 kg)    01/07/18 223 lb 12.3 oz (101.5 kg)  12/28/17 226 lb 6.4 oz (102.7 kg)    PHYSICAL EXAM: General:  Well appearing. No resp difficulty.  HEENT: normal Neck: supple. JVP 7-8. Carotids 2+ bilat; no bruits. No lymphadenopathy or thryomegaly appreciated. Cor: PMI nondisplaced. Regular rate & rhythm. No rubs, gallops or murmurs. Lungs: clear Abdomen: soft, nontender, nondistended. No hepatosplenomegaly. No bruits or masses. Good bowel sounds. Extremities: no cyanosis, clubbing, rash, R and LLE 1+  edema Neuro: alert & orientedx3, cranial nerves grossly intact. moves all 4 extremities w/o difficulty. Affect pleasant  ASSESSMENT & PLAN:  1. AKI in the setting of urinary retention and UTI Creatinine peaked at 7 but was down to 1.07 on 01/11/18.  Off lasix, spiro, and entresto. She does not take NSAIDs.   2. Chronic biventricular CHF due to NICM - Echo  06/2017  EF 30-35% (up from 15% in setting of AF in 5/18)  - TEE 03/25/17 with LVEF 15% and severely reduced RV function.   -- Required dobutamine during previous admission with cardiogenic shock in setting of Afib RVR - Amyloid w/u negative with negative fat pad biopsy and PYP scan (suspect AF cardiomyopathy)  - NYHA II-III. Volume status mildly elevated. Instructed to take 20 mg of lasix today and as needed for weight 232 pounds or greater.  Hold spiro and entresto. Can add back soon though she does not want to restart entresto.  - Continue Bidil 1 tabs TID.   3. Persistent Afib: Cannot tolerate AF with recurrent shock. S/p DCCV on 5/2 (she stopped Amio after this) and DCCV on 05/06/17 and again 05/12/17. - Ablation when improved.  - Continue Ranexa 500 mg BID.  - Continue warfarin. Follows with coumadin Clinic  - She absolutely cannot tolerate AF with recurrent cardiogenic shock when AF recurs. She was seen by Dr. Curt Bears in the hospital. Now pending AF ablation   4. Suspected Sleep Apnea: - Sleep study completed. Unable to locate results in  Epic  5.  COPD:  - No wheezing.  Follow up in 6 weeks with Dr [name].    Darrick Grinder, NP 01/20/18

## 2018-01-20 NOTE — Patient Instructions (Signed)
Take Furosemide (Lasix) 20 mg tablet once tomorrow (Thursday), then once daily AS NEEDED for weight 232 lbs or more.  Follow up 4 weeks with [name].  ____________________________________________________ Marin Roberts Code:  Take all medication as prescribed the day of your appointment. Bring all medications with you to your appointment.  Do the following things EVERYDAY: 1) Weigh yourself in the morning before breakfast. Write it down and keep it in a log. 2) Take your medicines as prescribed 3) Eat low salt foods-Limit salt (sodium) to 2000 mg per day.  4) Stay as active as you can everyday 5) Limit all fluids for the day to less than 2 liters

## 2018-01-25 ENCOUNTER — Ambulatory Visit (INDEPENDENT_AMBULATORY_CARE_PROVIDER_SITE_OTHER): Payer: BLUE CROSS/BLUE SHIELD | Admitting: Pharmacist

## 2018-01-25 DIAGNOSIS — Z7901 Long term (current) use of anticoagulants: Secondary | ICD-10-CM | POA: Diagnosis not present

## 2018-01-25 LAB — POCT INR: INR: 4.1

## 2018-01-25 NOTE — Patient Instructions (Signed)
Description   No warfarin today, tomorrow take only 1/2 tablet then continue 3mg  warfarin  1 tablet every day. Repeat INR in 2 weeks

## 2018-02-02 ENCOUNTER — Ambulatory Visit: Payer: BLUE CROSS/BLUE SHIELD | Admitting: Internal Medicine

## 2018-02-02 ENCOUNTER — Other Ambulatory Visit (INDEPENDENT_AMBULATORY_CARE_PROVIDER_SITE_OTHER): Payer: BLUE CROSS/BLUE SHIELD

## 2018-02-02 ENCOUNTER — Encounter: Payer: Self-pay | Admitting: Internal Medicine

## 2018-02-02 VITALS — BP 124/86 | Temp 98.0°F | Ht 65.0 in | Wt 226.0 lb

## 2018-02-02 DIAGNOSIS — R05 Cough: Secondary | ICD-10-CM

## 2018-02-02 DIAGNOSIS — R059 Cough, unspecified: Secondary | ICD-10-CM | POA: Insufficient documentation

## 2018-02-02 DIAGNOSIS — R8290 Unspecified abnormal findings in urine: Secondary | ICD-10-CM

## 2018-02-02 DIAGNOSIS — N139 Obstructive and reflux uropathy, unspecified: Secondary | ICD-10-CM | POA: Diagnosis not present

## 2018-02-02 DIAGNOSIS — I10 Essential (primary) hypertension: Secondary | ICD-10-CM

## 2018-02-02 LAB — URINALYSIS, ROUTINE W REFLEX MICROSCOPIC
BILIRUBIN URINE: NEGATIVE
KETONES UR: NEGATIVE
NITRITE: POSITIVE — AB
Specific Gravity, Urine: 1.02 (ref 1.000–1.030)
URINE GLUCOSE: NEGATIVE
Urobilinogen, UA: 0.2 (ref 0.0–1.0)
pH: 6 (ref 5.0–8.0)

## 2018-02-02 MED ORDER — ROSUVASTATIN CALCIUM 10 MG PO TABS: 10.0000 mg | ORAL_TABLET | Freq: Every day | ORAL | 3 refills | Status: DC

## 2018-02-02 MED ORDER — AMOXICILLIN 500 MG PO CAPS: 500.0000 mg | ORAL_CAPSULE | Freq: Three times a day (TID) | ORAL | 0 refills | Status: DC

## 2018-02-02 NOTE — Assessment & Plan Note (Signed)
Also for urine studies per pt request, not clear if acute UTI

## 2018-02-02 NOTE — Progress Notes (Signed)
Subjective:    Patient ID: Patricia Wagner, female    DOB: [birth year], 67 y.o.   MRN: [id number]  HPI  Here with acute onset mild to mod 2-3 days ST, HA, general weakness and malaise, with prod cough greenish sputum, but Pt denies chest pain, increased sob or doe, wheezing, orthopnea, PND, increased LE swelling, palpitations, dizziness or syncope.  Also has c/o foul smell off colored urine, wearing indwelling catheter changed last wk, believes she has some dysuria as well, but Denies urinary symptoms such as frequency, urgency, flank pain, hematuria or n/v.   Pt denies polydipsia, polyuria   Past Medical History:  Diagnosis Date  . Arthritis   . Atrial fibrillation ([place])    a. s/p multiple cardioversions; failed tikosyn/sotalol.  Marland Kitchen CAD in native artery, s/p cardiac cath with non obstructive CAD 10/24/2016  . CHF (congestive heart failure) ([place])   . DIVERTICULITIS, HX OF 07/25/2007  . DIVERTICULOSIS, COLON 07/22/2007  . Dyspnea   . Edema, peripheral    a. chronic BLE edema, R>L. Prior trauma from dog attack and accident.  Marland Kitchen HYPERLIPIDEMIA 02/03/2008  . Hypersomnia    declines w/u  . Hypertension   . MENOPAUSAL DISORDER 01/09/2011  . Morbid obesity ([place]) 07/22/2007  . NICM (nonischemic cardiomyopathy) ([place]) 10/24/2016  . Raynaud's syndrome 07/22/2007  . Stroke ([place]) 2017  . THYROID NODULE, RIGHT 01/04/2010  . VITAMIN D DEFICIENCY 01/09/2011   Qualifier: Diagnosis of  By: [name], [name]    Past Surgical History:  Procedure Laterality Date  . CARDIAC CATHETERIZATION N/A 10/23/2016   Procedure: Left Heart Cath and Coronary Angiography;  Surgeon: [name], MD;  Location: [place] [facility];  Service: Cardiovascular;  Laterality: N/A;  . CARDIOVERSION N/A 10/31/2016   Procedure: CARDIOVERSION;  Surgeon: [name], MD;  Location: [place];  Service: Cardiovascular;  Laterality: N/A;  . CARDIOVERSION N/A 11/03/2016   Procedure: CARDIOVERSION;  Surgeon: [name], MD;   Location: [place];  Service: Cardiovascular;  Laterality: N/A;  . CARDIOVERSION N/A 11/18/2016   Procedure: CARDIOVERSION;  Surgeon: [name], MD;  Location: [place];  Service: Cardiovascular;  Laterality: N/A;  . CARDIOVERSION N/A 03/25/2017   Procedure: CARDIOVERSION;  Surgeon: [name], MD;  Location: [facility] ENDOSCOPY;  Service: Cardiovascular;  Laterality: N/A;  . CARDIOVERSION N/A 05/06/2017   Procedure: CARDIOVERSION;  Surgeon: [name], MD;  Location: [facility] ENDOSCOPY;  Service: Cardiovascular;  Laterality: N/A;  . CARDIOVERSION N/A 05/12/2017   Procedure: CARDIOVERSION;  Surgeon: [name], MD;  Location: [facility] ENDOSCOPY;  Service: Cardiovascular;  Laterality: N/A;  . COLONOSCOPY N/A 12/04/2017   Procedure: COLONOSCOPY;  Surgeon: [name], MD;  Location: [place];  Service: Endoscopy;  Laterality: N/A;  . COLONOSCOPY W/ POLYPECTOMY  02/2011   pan diverticulosis.  tubular adenoma without dysplasia on 5 mm sigmoid polyp.  Dr Fuller Plan.    Marland Kitchen PARTIAL HYSTERECTOMY     1 OVARY LEFT  . TEE WITHOUT CARDIOVERSION N/A 10/31/2016   Procedure: TRANSESOPHAGEAL ECHOCARDIOGRAM (TEE);  Surgeon: [name], MD;  Location: [facility] ENDOSCOPY;  Service: Cardiovascular;  Laterality: N/A;  . TEE WITHOUT CARDIOVERSION N/A 03/25/2017   Procedure: TRANSESOPHAGEAL ECHOCARDIOGRAM (TEE);  Surgeon: [name], MD;  Location: [facility] ENDOSCOPY;  Service: Cardiovascular;  Laterality: N/A;    reports that she quit smoking about 15 months ago. Her smoking use included cigarettes. She smoked 0.50 packs per day. she has never used smokeless tobacco. She reports that she  does not drink alcohol or use drugs. family history includes Asthma in her mother; Diabetes in her father; Heart disease in her father and sister; Lung disease in her sister. Allergies  Allergen Reactions  . Ace Inhibitors Palpitations   Current Outpatient Medications on File Prior to Visit  Medication Sig Dispense  Refill  . acetaminophen (TYLENOL) 325 MG tablet Take 650 mg by mouth every 6 (six) hours as needed (for pain).     . carvedilol (COREG) 3.125 MG tablet Take 1 tablet (3.125 mg total) by mouth 2 (two) times daily. 180 tablet 3  . furosemide (LASIX) 20 MG tablet Take 1 tablet once daily AS NEEDED for weight 232 lbs or more. 30 tablet 11  . Investigational - Study Medication Take 1 tablet by mouth 2 (two) times daily. Study name: Galactic HF Study  Additional study details: Omecamtiv Mecarbil or Placebo 1 each PRN  . isosorbide-hydrALAZINE (BIDIL) 20-37.5 MG tablet Take 1 tablet by mouth 3 (three) times daily. (Patient taking differently: Take 2 tablets by mouth 3 (three) times daily. ) 180 tablet 11  . ranolazine (RANEXA) 500 MG 12 hr tablet Take 1 tablet (500 mg total) by mouth 2 (two) times daily. 60 tablet 5  . tamsulosin (FLOMAX) 0.4 MG CAPS capsule Take 1 capsule (0.4 mg total) by mouth daily. 30 capsule 1  . warfarin (COUMADIN) 5 MG tablet Take 1 tablet (5 mg total) by mouth daily at 6 PM. 30 tablet 0   No current facility-administered medications on file prior to visit.    Review of Systems  Constitutional: Negative for other unusual diaphoresis or sweats HENT: Negative for ear discharge or swelling Eyes: Negative for other worsening visual disturbances Respiratory: Negative for stridor or other swelling  Gastrointestinal: Negative for worsening distension or other blood Genitourinary: Negative for retention or other urinary change Musculoskeletal: Negative for other MSK pain or swelling Skin: Negative for color change or other new lesions Neurological: Negative for worsening tremors and other numbness  Psychiatric/Behavioral: Negative for worsening agitation or other fatigue All other system neg per pt    Objective:   Physical Exam BP 124/86   Temp 98 F (36.7 C) (Oral)   Ht 5\' 5"  (1.651 m)   Wt 226 lb (102.5 kg)   BMI 37.61 kg/m  VS noted, mild ill Constitutional: Pt  appears in NAD HENT: Head: NCAT.  Right Ear: External ear normal.  Left Ear: External ear normal.  Eyes: . Pupils are equal, round, and reactive to light. Conjunctivae and EOM are normal Nose: without d/c or deformity Bilat tm's with mild erythema.  Max sinus areas mild tender.  Pharynx with mild erythema, no exudate Neck: Neck supple. Gross normal ROM Cardiovascular: Normal rate and regular rhythm.   Pulmonary/Chest: Effort normal and breath sounds decreased without rales or wheezing.  Abd:  Soft, NT, ND, + BS, no organomegaly Neurological: Pt is alert. At baseline orientation, motor grossly intact Skin: Skin is warm. No rashes, other new lesions, no LE edema Psychiatric: Pt behavior is normal without agitation  No other exam findings    Assessment & Plan:

## 2018-02-02 NOTE — Assessment & Plan Note (Signed)
stable overall by history and exam, recent data reviewed with pt, and pt to continue medical treatment as before,  to f/u any worsening symptoms or concerns  

## 2018-02-02 NOTE — Patient Instructions (Addendum)
Please take all new medication as prescribed - the antibiotic  Please continue all other medications as before, and refills have been done if requested.  Please have the pharmacy call with any other refills you may need.  Please keep your appointments with your specialists as you may have planned  Please go to the LAB in the Basement (turn left off the elevator) for the tests to be done today - just the urine testing  You will be contacted by phone if any changes need to be made immediately.  Otherwise, you will receive a letter about your results with an explanation, but please check with MyChart first.  Please remember to sign up for MyChart if you have not done so, as this will be important to you in the future with finding out test results, communicating by private email, and scheduling acute appointments online when needed.

## 2018-02-02 NOTE — Assessment & Plan Note (Signed)
Mild to mod, c/w bronchitis vs pna, for antibx course,  to f/u any worsening symptoms or concerns

## 2018-02-02 NOTE — Assessment & Plan Note (Signed)
To cont indwelling catheter with change as planned, f/u urology

## 2018-02-05 ENCOUNTER — Encounter: Payer: Self-pay | Admitting: Internal Medicine

## 2018-02-05 ENCOUNTER — Other Ambulatory Visit: Payer: Self-pay | Admitting: Internal Medicine

## 2018-02-05 LAB — URINE CULTURE
MICRO NUMBER:: 90314075
SPECIMEN QUALITY: ADEQUATE

## 2018-02-05 MED ORDER — SULFAMETHOXAZOLE-TRIMETHOPRIM 800-160 MG PO TABS: 1.0000 | ORAL_TABLET | Freq: Two times a day (BID) | ORAL | 0 refills | Status: DC

## 2018-02-08 ENCOUNTER — Telehealth: Payer: Self-pay | Admitting: Internal Medicine

## 2018-02-08 MED ORDER — BENZONATATE 100 MG PO CAPS: 100.0000 mg | ORAL_CAPSULE | Freq: Three times a day (TID) | ORAL | 0 refills | Status: DC | PRN

## 2018-02-08 NOTE — Telephone Encounter (Signed)
Spoke with pt, she stated that the cough from her last visit is still lingering around. She would like to know of a cough syrup could be called in since she was only given an antibiotic at her OV? Please advise.

## 2018-02-08 NOTE — Addendum Note (Signed)
Addended by: Pricilla Holm A on: 02/08/2018 10:34 AM   Modules accepted: Orders

## 2018-02-08 NOTE — Telephone Encounter (Signed)
Patient requesting call back in regard to cough syrup refill.

## 2018-02-08 NOTE — Telephone Encounter (Signed)
Pt has been notified.

## 2018-02-08 NOTE — Telephone Encounter (Signed)
Have sent in tessalon perles to use for cough up to 3 times per day.

## 2018-02-09 NOTE — Progress Notes (Signed)
Shirron to please print letter for me to sign 

## 2018-02-12 ENCOUNTER — Other Ambulatory Visit: Payer: Self-pay

## 2018-02-12 ENCOUNTER — Other Ambulatory Visit: Payer: Self-pay | Admitting: Internal Medicine

## 2018-02-12 ENCOUNTER — Telehealth: Payer: Self-pay | Admitting: Cardiology

## 2018-02-12 MED ORDER — WARFARIN SODIUM 5 MG PO TABS: 5.0000 mg | ORAL_TABLET | Freq: Every day | ORAL | 0 refills | Status: DC

## 2018-02-12 NOTE — Telephone Encounter (Signed)
Please refill.

## 2018-02-12 NOTE — Telephone Encounter (Signed)
New message     Error

## 2018-03-01 ENCOUNTER — Encounter (INDEPENDENT_AMBULATORY_CARE_PROVIDER_SITE_OTHER): Payer: Self-pay

## 2018-03-01 ENCOUNTER — Ambulatory Visit: Payer: BLUE CROSS/BLUE SHIELD | Admitting: Cardiology

## 2018-03-01 ENCOUNTER — Other Ambulatory Visit: Payer: Self-pay | Admitting: Pharmacist Clinician (PhC)/ Clinical Pharmacy Specialist

## 2018-03-01 ENCOUNTER — Encounter: Payer: Self-pay | Admitting: Cardiology

## 2018-03-01 VITALS — BP 132/66 | HR 67 | Ht 65.0 in | Wt 245.0 lb

## 2018-03-01 DIAGNOSIS — I481 Persistent atrial fibrillation: Secondary | ICD-10-CM

## 2018-03-01 DIAGNOSIS — I428 Other cardiomyopathies: Secondary | ICD-10-CM | POA: Diagnosis not present

## 2018-03-01 DIAGNOSIS — I4819 Other persistent atrial fibrillation: Secondary | ICD-10-CM

## 2018-03-01 MED ORDER — WARFARIN SODIUM 3 MG PO TABS: 3.0000 mg | ORAL_TABLET | Freq: Every day | ORAL | 0 refills | Status: DC

## 2018-03-01 MED ORDER — AMIODARONE HCL 200 MG PO TABS: ORAL_TABLET | ORAL | 3 refills | Status: DC

## 2018-03-01 NOTE — Patient Instructions (Addendum)
Medication Instructions:  Your physician has recommended you make the following change in your medication:  1. TAKE your LASIX twice daily until you see [name] this Thursday 2. RESTART Amiodarone  - take 1 tablet (200 mg total) twice daily for 2 weeks, then  - take 1 tablet once daily  Labwork: None ordered  Testing/Procedures: None ordered  Follow-Up: Your physician recommends that you schedule a follow-up appointment in: 1 month with Dr. Curt Bears.  * If you need a refill on your cardiac medications before your next appointment, please call your pharmacy.   *Please note that any paperwork needing to be filled out by the provider will need to be addressed at the front desk prior to seeing the provider. Please note that any FMLA, disability or other documents regarding health condition is subject to a $25.00 charge that must be received prior to completion of paperwork in the form of a money order or check.  Thank you for choosing [facility]!!   Trinidad Curet, RN [phone]

## 2018-03-01 NOTE — Progress Notes (Signed)
Electrophysiology Office Note   Date:  03/01/2018   ID:  Dhamar, Gregory 1951-04-16, MRN [id number]  PCP:  Biagio Borg, MD  Cardiologist:  [place] Primary Electrophysiologist:  Izumi Mixon Meredith Leeds, MD    Chief Complaint  Patient presents with  . Follow-up    Persistent Afib/Discuss ablation     History of Present Illness: Patricia Wagner is a 67 y.o. female who is being seen today for the evaluation of CHF, atrial fibrillation at the request of Biagio Borg, MD. Presenting today for electrophysiology evaluation. She has a history of persistent atrial fibrillation, nonischemic cardiomyopathy, CHF, hypertension, and hyperlipidemia. She has had atrial fibrillation since December 2017. She is had multiple cardioversions and has been loaded on amiodarone. She was admitted to the hospital in June 2018 in cardiogenic shock in the setting of persistent atrial fibrillation. EF was found to be 15% with severe bi-V dysfunction. She was started on amiodarone. She was successfully cardioverted to sinus rhythm after initiation of IV amiodarone.Since that time she has done well without recurrence of atrial fibrillation.  Today, denies symptoms of palpitations, chest pain, shortness of breath, orthopnea, PND, lower extremity edema, claudication, dizziness, presyncope, syncope, bleeding, or neurologic sequela. The patient is tolerating medications without difficulties.  He overall feels well.  She does say that she has been gaining weight.  Her dry weight is around 230 pounds.  She weighs 245 pounds today.   Past Medical History:  Diagnosis Date  . Arthritis   . Atrial fibrillation ([place])    a. s/p multiple cardioversions; failed tikosyn/sotalol.  Marland Kitchen CAD in native artery, s/p cardiac cath with non obstructive CAD 10/24/2016  . CHF (congestive heart failure) ([place])   . DIVERTICULITIS, HX OF 07/25/2007  . DIVERTICULOSIS, COLON 07/22/2007  . Dyspnea   . Edema, peripheral    a. chronic BLE edema, R>L.  Prior trauma from dog attack and accident.  Marland Kitchen HYPERLIPIDEMIA 02/03/2008  . Hypersomnia    declines w/u  . Hypertension   . MENOPAUSAL DISORDER 01/09/2011  . Morbid obesity ([place]) 07/22/2007  . NICM (nonischemic cardiomyopathy) ([place]) 10/24/2016  . Raynaud's syndrome 07/22/2007  . Stroke ([place]) 2017  . THYROID NODULE, RIGHT 01/04/2010  . VITAMIN D DEFICIENCY 01/09/2011   Qualifier: Diagnosis of  By: [name], [name]    Past Surgical History:  Procedure Laterality Date  . CARDIAC CATHETERIZATION N/A 10/23/2016   Procedure: Left Heart Cath and Coronary Angiography;  Surgeon: [name], MD;  Location: [place] [facility];  Service: Cardiovascular;  Laterality: N/A;  . CARDIOVERSION N/A 10/31/2016   Procedure: CARDIOVERSION;  Surgeon: [name], MD;  Location: [place];  Service: Cardiovascular;  Laterality: N/A;  . CARDIOVERSION N/A 11/03/2016   Procedure: CARDIOVERSION;  Surgeon: [name], MD;  Location: [place];  Service: Cardiovascular;  Laterality: N/A;  . CARDIOVERSION N/A 11/18/2016   Procedure: CARDIOVERSION;  Surgeon: [name], MD;  Location: [place];  Service: Cardiovascular;  Laterality: N/A;  . CARDIOVERSION N/A 03/25/2017   Procedure: CARDIOVERSION;  Surgeon: [name], MD;  Location: [facility] ENDOSCOPY;  Service: Cardiovascular;  Laterality: N/A;  . CARDIOVERSION N/A 05/06/2017   Procedure: CARDIOVERSION;  Surgeon: [name], MD;  Location: [facility] ENDOSCOPY;  Service: Cardiovascular;  Laterality: N/A;  . CARDIOVERSION N/A 05/12/2017   Procedure: CARDIOVERSION;  Surgeon: [name], MD;  Location: [facility] ENDOSCOPY;  Service: Cardiovascular;  Laterality: N/A;  . COLONOSCOPY N/A 12/04/2017   Procedure: COLONOSCOPY;  Surgeon:  [name], MD;  Location: [facility] ENDOSCOPY;  Service: Endoscopy;  Laterality: N/A;  . COLONOSCOPY W/ POLYPECTOMY  02/2011   pan diverticulosis.  tubular adenoma without dysplasia on 5 mm sigmoid polyp.  Dr Fuller Plan.    Marland Kitchen  PARTIAL HYSTERECTOMY     1 OVARY LEFT  . TEE WITHOUT CARDIOVERSION N/A 10/31/2016   Procedure: TRANSESOPHAGEAL ECHOCARDIOGRAM (TEE);  Surgeon: [name], MD;  Location: [facility] ENDOSCOPY;  Service: Cardiovascular;  Laterality: N/A;  . TEE WITHOUT CARDIOVERSION N/A 03/25/2017   Procedure: TRANSESOPHAGEAL ECHOCARDIOGRAM (TEE);  Surgeon: [name], MD;  Location: [facility] ENDOSCOPY;  Service: Cardiovascular;  Laterality: N/A;     Current Outpatient Medications  Medication Sig Dispense Refill  . acetaminophen (TYLENOL) 325 MG tablet Take 650 mg by mouth every 6 (six) hours as needed (for pain).     . carvedilol (COREG) 3.125 MG tablet Take 1 tablet (3.125 mg total) by mouth 2 (two) times daily. 180 tablet 3  . furosemide (LASIX) 20 MG tablet Take 1 tablet once daily AS NEEDED for weight 232 lbs or more. 30 tablet 11  . HYDROcodone-acetaminophen (NORCO/VICODIN) 5-325 MG tablet Take 1 tablet by mouth every 6 (six) hours as needed for pain. for pain  0  . Investigational - Study Medication Take 1 tablet by mouth 2 (two) times daily. Study name: Galactic HF Study  Additional study details: Omecamtiv Mecarbil or Placebo 1 each PRN  . isosorbide-hydrALAZINE (BIDIL) 20-37.5 MG tablet Take 1 tablet by mouth 3 (three) times daily. (Patient taking differently: Take 2 tablets by mouth 3 (three) times daily. ) 180 tablet 11  . ranolazine (RANEXA) 500 MG 12 hr tablet Take 1 tablet (500 mg total) by mouth 2 (two) times daily. 60 tablet 5  . warfarin (COUMADIN) 3 MG tablet Take 1 tablet (3 mg total) by mouth daily. 90 tablet 0   No current facility-administered medications for this visit.     Allergies:   Ace inhibitors   Social History:  The patient  reports that she quit smoking about 16 months ago. Her smoking use included cigarettes. She smoked 0.50 packs per day. She has never used smokeless tobacco. She reports that she does not drink alcohol or use drugs.   Family History:  The patient's family history  includes Asthma in her mother; Diabetes in her father; Heart disease in her father and sister; Lung disease in her sister.    ROS:  Please see the history of present illness.   Otherwise, review of systems is positive for none.   All other systems are reviewed and negative.   PHYSICAL EXAM: VS:  BP 132/66   Pulse 67   Ht 5\' 5"  (1.651 m)   Wt 245 lb (111.1 kg)   SpO2 93%   BMI 40.77 kg/m  , BMI Body mass index is 40.77 kg/m. GEN: Well nourished, well developed, in no acute distress  HEENT: normal  Neck: no JVD, carotid bruits, or masses Cardiac: RRR; no murmurs, rubs, or gallops,no edema  Respiratory:  clear to auscultation bilaterally, normal work of breathing GI: soft, nontender, nondistended, + BS MS: no deformity or atrophy  Skin: warm and dry Neuro:  Strength and sensation are intact Psych: euthymic mood, full affect  EKG:  EKG is not ordered today. Personal review of the ekg ordered 12/29/17 shows SR, rate 70, IVCD   Recent Labs: 04/29/2017: B Natriuretic Peptide 2,712.1 07/21/2017: TSH 0.54 12/08/2017: Magnesium 1.8 12/29/2017: ALT 14 01/07/2018: BUN 7; Creatinine, Ser 1.44;  Potassium 4.1; Sodium 140 01/08/2018: Hemoglobin 8.4; Platelets 303    Lipid Panel     Component Value Date/Time   CHOL 94 12/05/2017 1203   TRIG 54 12/05/2017 1203   HDL 47 12/05/2017 1203   CHOLHDL 2.0 12/05/2017 1203   VLDL 11 12/05/2017 1203   LDLCALC 36 12/05/2017 1203   LDLDIRECT 121.6 03/15/2012 1646     Wt Readings from Last 3 Encounters:  03/01/18 245 lb (111.1 kg)  02/02/18 226 lb (102.5 kg)  01/20/18 229 lb 3.2 oz (104 kg)      Other studies Reviewed: Additional studies/ records that were reviewed today include: TTE 07/16/17  Review of the above records today demonstrates:  - Left ventricle: The cavity size was normal. There was severe   concentric hypertrophy. Systolic function was moderately to   severely reduced. The estimated ejection fraction was in the   range of 30% to  35%. Diffuse hypokinesis. - Mitral valve: There was mild to moderate regurgitation. - Left atrium: The atrium was severely dilated. - Right ventricle: The cavity size was mildly dilated. Wall   thickness was normal. Systolic function was moderately reduced. - Right atrium: The atrium was moderately dilated. - Pericardium, extracardiac: A trivial pericardial effusion was   identified. Features were not consistent with tamponade   physiology.  LHC 10/23/16 1. Mild, non-obstructive coronary artery disease consistent with non-ischemic cardiomyopathy. 2. Mildly elevated left ventricular filling pressure.   ASSESSMENT AND PLAN:  1.  Persistent atrial fibrillation: Warfarin.  Has been had multiple episodes of atrial fibrillation at times causing cardiogenic shock.  Her left atrium is severely dilated and thus I feel that she Yusra Ravert likely need both antiarrhythmics and ablation to remain in sinus rhythm.  Risks and benefits of ablation were discussed and include bleeding, tamponade, heart block, stroke, damage to surrounding organs.  She understands these risks and is agreed to the procedure.  We Betina Puckett wait until she is better compensated from a heart failure standpoint prior to ablation.  She is to take Lasix 20 mg twice a day until she sees [name].  She has been off of her amiodarone for approximately a month and a half.  Betsey Sossamon restart today.  This patients CHA2DS2-VASc Score and unadjusted Ischemic Stroke Rate (% per year) is equal to 3.2 % stroke rate/year from a score of 3  Above score calculated as 1 point each if present [CHF, HTN, DM, Vascular=MI/PAD/Aortic Plaque, Age if 65-74, or Female] Above score calculated as 2 points each if present [Age > 75, or Stroke/TIA/TE]   2. Nonischemic cardiomyopathy: Ejection fraction has remained low.  She would likely benefit from an ICD.  We Cornelius Marullo discuss further post atrial fibrillation ablation.  She is approximately 15 pounds over her dry weight.  I  would prefer to avoid her after her weight has improved.  We Jenni Thew put her on twice daily Lasix.  She does have follow-up later this week with [name].  I Geoffrey Mankin see her back in approximately 1 month and Abygayle Deltoro likely schedule ablation at that time if she is well compensated.  Current medicines are reviewed at length with the patient today.   The patient does not have concerns regarding her medicines.  The following changes were made today: Increase Lasix to 20 mg twice a day, restart amiodarone  Labs/ tests ordered today include:  No orders of the defined types were placed in this encounter.    Disposition:   FU with Shelagh Rayman 1 months  Signed, Ezekeil Bethel Meredith Leeds, MD  03/01/2018 4:09 PM     [address] [place] [phone] (office) [phone] (fax)

## 2018-03-04 ENCOUNTER — Other Ambulatory Visit: Payer: Self-pay

## 2018-03-04 ENCOUNTER — Encounter (HOSPITAL_COMMUNITY): Payer: Self-pay | Admitting: Internal Medicine

## 2018-03-04 ENCOUNTER — Ambulatory Visit (HOSPITAL_BASED_OUTPATIENT_CLINIC_OR_DEPARTMENT_OTHER)
Admission: RE | Admit: 2018-03-04 | Discharge: 2018-03-04 | Disposition: A | Discharge status: Home / Self Care | Payer: BLUE CROSS/BLUE SHIELD | Source: Ambulatory Visit | Attending: Internal Medicine | Admitting: Internal Medicine

## 2018-03-04 ENCOUNTER — Encounter: Payer: BLUE CROSS/BLUE SHIELD | Admitting: *Deleted

## 2018-03-04 VITALS — BP 143/70 | HR 58 | Resp 20 | Wt 240.6 lb

## 2018-03-04 VITALS — BP 136/84 | HR 68 | Wt 242.1 lb

## 2018-03-04 DIAGNOSIS — Z79899 Other long term (current) drug therapy: Secondary | ICD-10-CM

## 2018-03-04 DIAGNOSIS — Z7901 Long term (current) use of anticoagulants: Secondary | ICD-10-CM

## 2018-03-04 DIAGNOSIS — I481 Persistent atrial fibrillation: Secondary | ICD-10-CM

## 2018-03-04 DIAGNOSIS — I429 Cardiomyopathy, unspecified: Secondary | ICD-10-CM

## 2018-03-04 DIAGNOSIS — E559 Vitamin D deficiency, unspecified: Secondary | ICD-10-CM | POA: Insufficient documentation

## 2018-03-04 DIAGNOSIS — I5022 Chronic systolic (congestive) heart failure: Secondary | ICD-10-CM

## 2018-03-04 DIAGNOSIS — I340 Nonrheumatic mitral (valve) insufficiency: Secondary | ICD-10-CM | POA: Insufficient documentation

## 2018-03-04 DIAGNOSIS — I428 Other cardiomyopathies: Secondary | ICD-10-CM | POA: Diagnosis not present

## 2018-03-04 DIAGNOSIS — D62 Acute posthemorrhagic anemia: Secondary | ICD-10-CM | POA: Diagnosis not present

## 2018-03-04 DIAGNOSIS — N179 Acute kidney failure, unspecified: Secondary | ICD-10-CM | POA: Insufficient documentation

## 2018-03-04 DIAGNOSIS — I110 Hypertensive heart disease with heart failure: Secondary | ICD-10-CM | POA: Insufficient documentation

## 2018-03-04 DIAGNOSIS — I2510 Atherosclerotic heart disease of native coronary artery without angina pectoris: Secondary | ICD-10-CM

## 2018-03-04 DIAGNOSIS — Z87891 Personal history of nicotine dependence: Secondary | ICD-10-CM | POA: Insufficient documentation

## 2018-03-04 DIAGNOSIS — J449 Chronic obstructive pulmonary disease, unspecified: Secondary | ICD-10-CM

## 2018-03-04 DIAGNOSIS — N390 Urinary tract infection, site not specified: Secondary | ICD-10-CM | POA: Insufficient documentation

## 2018-03-04 DIAGNOSIS — I480 Paroxysmal atrial fibrillation: Secondary | ICD-10-CM | POA: Diagnosis not present

## 2018-03-04 DIAGNOSIS — K5731 Diverticulosis of large intestine without perforation or abscess with bleeding: Secondary | ICD-10-CM | POA: Diagnosis not present

## 2018-03-04 DIAGNOSIS — Z8673 Personal history of transient ischemic attack (TIA), and cerebral infarction without residual deficits: Secondary | ICD-10-CM | POA: Insufficient documentation

## 2018-03-04 DIAGNOSIS — Z006 Encounter for examination for normal comparison and control in clinical research program: Secondary | ICD-10-CM

## 2018-03-04 DIAGNOSIS — Z6841 Body Mass Index (BMI) 40.0 and over, adult: Secondary | ICD-10-CM | POA: Insufficient documentation

## 2018-03-04 DIAGNOSIS — E785 Hyperlipidemia, unspecified: Secondary | ICD-10-CM | POA: Insufficient documentation

## 2018-03-04 DIAGNOSIS — I509 Heart failure, unspecified: Secondary | ICD-10-CM | POA: Insufficient documentation

## 2018-03-04 LAB — BASIC METABOLIC PANEL
Anion gap: 11 (ref 5–15)
BUN: 98 mg/dL — ABNORMAL HIGH (ref 6–20)
CALCIUM: 8.8 mg/dL — AB (ref 8.9–10.3)
CO2: 21 mmol/L — AB (ref 22–32)
Chloride: 108 mmol/L (ref 101–111)
Creatinine, Ser: 4.3 mg/dL — ABNORMAL HIGH (ref 0.44–1.00)
GFR calc non Af Amer: 10 mL/min — ABNORMAL LOW (ref >60)
GFR, EST AFRICAN AMERICAN: 12 mL/min — AB (ref >60)
Glucose, Bld: 105 mg/dL — ABNORMAL HIGH (ref 65–99)
Potassium: 3.8 mmol/L (ref 3.5–5.1)
Sodium: 140 mmol/L (ref 135–145)

## 2018-03-04 MED ORDER — SACUBITRIL-VALSARTAN 24-26 MG PO TABS: 1.0000 | ORAL_TABLET | Freq: Two times a day (BID) | ORAL | 3 refills | Status: DC

## 2018-03-04 NOTE — Patient Instructions (Signed)
Restart Entresto 24/26 mg Twice daily   Labs today  Labs in 1 week  Please wear TED Hose daily, place on as soon as you get up in the morning and remove at night before going to bed.  Your physician recommends that you schedule a follow-up appointment in: 6 weeks

## 2018-03-04 NOTE — Progress Notes (Signed)
Advanced Heart Failure Clinic Note    Primary Cardiologist: Dr. [name] Primary HF: [name]  HPI: Patricia Wagner is a 67 y.o. female 67 year old female with a past medical history of persistent atrial fibrillation, NICM cardiomyopathy, Biventricular CHF (LVEF 15% and RV severely reduced by TEE 03/25/17), HTN, and HLD.   She has struggled with symptomatic afib since 10/2016. Had successful DCCV 10/2016. Failed DCCV 03/2017, after stopping her amiodarone. Seen in A-Fib clinic 04/09/17 and planned for repeat DCCV after further amio load.   Pt [facility]/6 - 05/16/17 with cardiogenic shock in the setting of persistent AF. EF 15% with severe biventricular dysfunction. Started on amio gtt and PICC line placed for dobutamine with low output HF. Underwent DCCV 05/06/17 but went back into Afib 05/08/17 and developed recurrent cardiogenic shock, requiring re-initiation of dobutamine and amio. Pt had repeat DCCV 05/12/17 with successful conversion to NSR. Amio gtt continued to ensure adequate load. Transitioned to po 05/15/17. Ranexa also added to her regimen for added anti-arrhythmic benefit. HF meds optimized as tolerated.  PT recommended SNF, but pt preferred to return home with HHPT.   Last echocardiogram August 2018 showed ejection fraction 30-35%,severe left ventricular hypertrophy, mild to moderate mitral regurgitation, biatrial enlargement and moderately reduced RV function. Fat pad bx negative. Nuclear study 10/18 felt equivocal but not favored for amyloid. Patient has been scheduled for atrial fibrillation ablation January 2019 as she has had cardiogenic shock with atrial fibrillation previously.   Admitted 2/5 through 01/08/2018 with UTI and AKI. Creatinine peaked at 7. Lasix, spiro, and entresto stopped. Required home IV antibiotics and discharged with foley. At the time of discharge creatinine was down to 1.07.   She presents today for regular follow up. She saw Dr Curt Bears earlier this  week. He started her back on lasix 20 mg BID (previously taking as needed) for 15 lb weight gain and also restarted her amiodarone. Plans for afib ablation once she is closer to her dry weight. Overall she is doing well today. She is down 5 lbs after restarting lasix. No SOB, but limited activity due to knees. Walks from house to car with no problem. No orthopnea/PND. She does have BLE edema that started about 1 week ago. No CP, palpitations, or dizziness. Not weighing at home - needs a battery for scale, but says she is up 20 lbs from baseline weight of ~220 lbs. Compliant with meds. Limits fluid and salt typically, but ate badly at daughter's wedding this past weekend. Says she is having problems with urinary retention. Pending appointment with urology next week. No fevers or chills or dysuria.   Echo from 07/15/17 EF 30-35% Severe LVH. RV down. Personally reviewed   Past Medical History:  Diagnosis Date  . Arthritis   . Atrial fibrillation ([place])    a. s/p multiple cardioversions; failed tikosyn/sotalol.  Marland Kitchen CAD in native artery, s/p cardiac cath with non obstructive CAD 10/24/2016  . CHF (congestive heart failure) ([place])   . DIVERTICULITIS, HX OF 07/25/2007  . DIVERTICULOSIS, COLON 07/22/2007  . Dyspnea   . Edema, peripheral    a. chronic BLE edema, R>L. Prior trauma from dog attack and accident.  Marland Kitchen HYPERLIPIDEMIA 02/03/2008  . Hypersomnia    declines w/u  . Hypertension   . MENOPAUSAL DISORDER 01/09/2011  . Morbid obesity ([place]) 07/22/2007  . NICM (nonischemic cardiomyopathy) ([place]) 10/24/2016  . Raynaud's syndrome 07/22/2007  . Stroke ([place]) 2017  . THYROID NODULE, RIGHT 01/04/2010  . VITAMIN  D DEFICIENCY 01/09/2011   Qualifier: Diagnosis of  By: [name], [name]     Current Outpatient Medications  Medication Sig Dispense Refill  . acetaminophen (TYLENOL) 325 MG tablet Take 650 mg by mouth every 6 (six) hours as needed (for pain).     Marland Kitchen amiodarone (PACERONE) 200 MG tablet Take 1 tablet (200 mg  total) TWICE a day for 2 weeks, then take 1 tablet (200 mg total) ONCE daily. 90 tablet 3  . furosemide (LASIX) 20 MG tablet Take 1 tablet once daily AS NEEDED for weight 232 lbs or more. 30 tablet 11  . HYDROcodone-acetaminophen (NORCO/VICODIN) 5-325 MG tablet Take 1 tablet by mouth every 6 (six) hours as needed for pain. for pain  0  . Investigational - Study Medication Take 1 tablet by mouth 2 (two) times daily. Study name: Galactic HF Study  Additional study details: Omecamtiv Mecarbil or Placebo 1 each PRN  . isosorbide-hydrALAZINE (BIDIL) 20-37.5 MG tablet Take 1 tablet by mouth 3 (three) times daily. 180 tablet 11  . ranolazine (RANEXA) 500 MG 12 hr tablet Take 1 tablet (500 mg total) by mouth 2 (two) times daily. 60 tablet 5  . warfarin (COUMADIN) 3 MG tablet Take 1 tablet (3 mg total) by mouth daily. 90 tablet 0  . carvedilol (COREG) 3.125 MG tablet Take 1 tablet (3.125 mg total) by mouth 2 (two) times daily. 180 tablet 3   No current facility-administered medications for this encounter.     Allergies  Allergen Reactions  . Ace Inhibitors Palpitations     Social History   Socioeconomic History  . Marital status: Single    Spouse name: Not on file  . Number of children: 1  . Years of education: Not on file  . Highest education level: Not on file  Occupational History  . Occupation: Engineer, manufacturing  . Financial resource strain: Not on file  . Food insecurity:    Worry: Not on file    Inability: Not on file  . Transportation needs:    Medical: Not on file    Non-medical: Not on file  Tobacco Use  . Smoking status: Former Smoker    Packs/day: 0.50    Types: Cigarettes    Last attempt to quit: 10/23/2016    Years since quitting: 1.3  . Smokeless tobacco: Never Used  . Tobacco comment: Smoked for 50 years  Substance and Sexual Activity  . Alcohol use: No    Alcohol/week: 2.4 oz    Types: 4 Cans of beer per week    Frequency: Never    Comment: Intermittent    . Drug use: No  . Sexual activity: Not on file  Lifestyle  . Physical activity:    Days per week: Not on file    Minutes per session: Not on file  . Stress: Not on file  Relationships  . Social connections:    Talks on phone: Not on file    Gets together: Not on file    Attends religious service: Not on file    Active member of club or organization: Not on file    Attends meetings of clubs or organizations: Not on file    Relationship status: Not on file  . Intimate partner violence:    Fear of current or ex partner: Not on file    Emotionally abused: Not on file    Physically abused: Not on file    Forced sexual activity: Not on file  Other Topics Concern  . Not on file  Social History Narrative  . Not on file     Family History  Problem Relation Age of Onset  . Asthma Mother   . Diabetes Father   . Heart disease Father        Died of presumed heart attack - 78s  . Lung disease Sister   . Heart disease Sister        Twin sister has heart issue, unclear what kind  . Thyroid disease Neg Hx     Vitals:   03/04/18 1440  BP: 136/84  Pulse: 68  Weight: 242 lb 1.9 oz (109.8 kg)    Wt Readings from Last 3 Encounters:  03/04/18 242 lb 1.9 oz (109.8 kg)  03/01/18 245 lb (111.1 kg)  02/02/18 226 lb (102.5 kg)    PHYSICAL EXAM: General: Well appearing. No resp difficulty. HEENT: Normal anicteric  Neck: Supple. JVP flat. Carotids 2+ bilat; no bruits. No thyromegaly or nodule noted. Cor: PMI nondisplaced. RRR, No M/G/R noted Lungs: CTAB, normal effort. No wheeze Abdomen: obese Soft, non-tender, +distended, no HSM. No bruits or masses. +BS  Extremities: No cyanosis, clubbing, or rash. R and LLE 2+ edema to knees warm Neuro: Alert & orientedx3, cranial nerves grossly intact. moves all 4 extremities w/o difficulty. Affect pleasant  EKG: SB 55 IVCD 1AVB 253ms. +u waves. Personally reviewed   ASSESSMENT & PLAN:  1. Chronic biventricular CHF due to NICM (suspect AF  cardiomyopathy) - Echo  06/2017  EF 30-35% (up from 15% in setting of AF in 5/18)  - TEE 03/25/17 with LVEF 15% and severely reduced RV function.   - Required dobutamine during previous admission with cardiogenic shock in setting of Afib RVR - Amyloid w/u negative with negative fat pad biopsy and PYP scan  - NYHA II-III. Volume status elevated - Continue lasix 20 mg BID - Spiro 25 on hold with recent AKI.  - Restart Entresto 24/26 BID. BMET today and in 1 week - Continue Bidil 1 tabs TID.  - Continue coreg 3.125 mg BID - Apply TED hose  2. Persistent Afib: Cannot tolerate AF with recurrent shock. S/p DCCV on 5/2 (she stopped Amio after this) and DCCV on 05/06/17 and again 05/12/17. - She absolutely cannot tolerate AF with recurrent cardiogenic shock when AF recurs. - Continue Ranexa 500 mg BID.  - Continue warfarin. Follows with coumadin Clinic  - Amiodarone restarted by Dr Curt Bears on 4/8. She will take 200 mg BID x 2 weeks, then 200 mg daily. Plans for afib ablation once volume better controlled  3. Suspected Sleep Apnea: - Sleep study completed. Unable to locate results in Epic  4.  COPD:  - No wheezing.  5. AKI in the setting of urinary retention and UTI - Creatinine peaked at 7 during recent hospitalization in February. Creatinine 1.44 on 01/07/18 - She does not take NSAIDs.  - Following with urology- sees them next week.  Restart entresto 25/26 BID BMET today and in 1 week EKG today TED hose Follow up in 6 weeks  Patricia Shore, NP 03/04/18    Patient seen and examined with the above-signed Advanced Practice Provider and/or Housestaff. I personally reviewed laboratory data, imaging studies and relevant notes. I independently examined the patient and formulated the important aspects of the plan. I have edited the note to reflect any of my changes or salient points. I have personally discussed the plan with the patient and/or family.  Overall seems stable  from HF standpoint. Has  some LE edema but JVP not up. No s3 on exam. Agree with continuing lasix. Restart low-dose Entresto. Place TED hose. Maintaining NSR. I am concerned about urinary retention and possible obstructive uropathy. TOld her to consider checking in with urology this week. Check labs.   Glori Bickers MD   Addendum: labs back with severe AKI. Creatinine 4.3. Not acidotic. Stop lasix and Entresto. Will need to go to ER.   Glori Bickers, MD  10:28 PM

## 2018-03-05 ENCOUNTER — Inpatient Hospital Stay (HOSPITAL_COMMUNITY): Payer: BLUE CROSS/BLUE SHIELD

## 2018-03-05 ENCOUNTER — Telehealth: Payer: Self-pay | Admitting: Pharmacist

## 2018-03-05 ENCOUNTER — Other Ambulatory Visit: Payer: Self-pay

## 2018-03-05 ENCOUNTER — Inpatient Hospital Stay (HOSPITAL_COMMUNITY)
Admission: EM | Admit: 2018-03-05 | Discharge: 2018-03-10 | DRG: 378 | Disposition: A | Discharge status: Home / Self Care | Payer: BLUE CROSS/BLUE SHIELD | Attending: Internal Medicine | Admitting: Internal Medicine

## 2018-03-05 ENCOUNTER — Encounter (HOSPITAL_COMMUNITY): Payer: Self-pay | Admitting: Emergency Medicine

## 2018-03-05 ENCOUNTER — Telehealth (HOSPITAL_COMMUNITY): Payer: Self-pay | Admitting: *Deleted

## 2018-03-05 DIAGNOSIS — K625 Hemorrhage of anus and rectum: Secondary | ICD-10-CM | POA: Diagnosis not present

## 2018-03-05 DIAGNOSIS — I5082 Biventricular heart failure: Secondary | ICD-10-CM | POA: Diagnosis present

## 2018-03-05 DIAGNOSIS — D689 Coagulation defect, unspecified: Secondary | ICD-10-CM | POA: Diagnosis present

## 2018-03-05 DIAGNOSIS — I5042 Chronic combined systolic (congestive) and diastolic (congestive) heart failure: Secondary | ICD-10-CM | POA: Diagnosis present

## 2018-03-05 DIAGNOSIS — R338 Other retention of urine: Secondary | ICD-10-CM | POA: Diagnosis present

## 2018-03-05 DIAGNOSIS — N184 Chronic kidney disease, stage 4 (severe): Secondary | ICD-10-CM

## 2018-03-05 DIAGNOSIS — D631 Anemia in chronic kidney disease: Secondary | ICD-10-CM | POA: Diagnosis present

## 2018-03-05 DIAGNOSIS — N183 Chronic kidney disease, stage 3 unspecified: Secondary | ICD-10-CM

## 2018-03-05 DIAGNOSIS — K5731 Diverticulosis of large intestine without perforation or abscess with bleeding: Secondary | ICD-10-CM | POA: Diagnosis present

## 2018-03-05 DIAGNOSIS — I38 Endocarditis, valve unspecified: Secondary | ICD-10-CM

## 2018-03-05 DIAGNOSIS — I482 Chronic atrial fibrillation, unspecified: Secondary | ICD-10-CM | POA: Diagnosis present

## 2018-03-05 DIAGNOSIS — N1832 Chronic kidney disease, stage 3b: Secondary | ICD-10-CM | POA: Diagnosis present

## 2018-03-05 DIAGNOSIS — I2510 Atherosclerotic heart disease of native coronary artery without angina pectoris: Secondary | ICD-10-CM | POA: Diagnosis present

## 2018-03-05 DIAGNOSIS — Z79899 Other long term (current) drug therapy: Secondary | ICD-10-CM

## 2018-03-05 DIAGNOSIS — K5900 Constipation, unspecified: Secondary | ICD-10-CM | POA: Diagnosis not present

## 2018-03-05 DIAGNOSIS — I5022 Chronic systolic (congestive) heart failure: Secondary | ICD-10-CM

## 2018-03-05 DIAGNOSIS — Z87440 Personal history of urinary (tract) infections: Secondary | ICD-10-CM

## 2018-03-05 DIAGNOSIS — N1330 Unspecified hydronephrosis: Secondary | ICD-10-CM | POA: Diagnosis present

## 2018-03-05 DIAGNOSIS — I959 Hypotension, unspecified: Secondary | ICD-10-CM | POA: Diagnosis present

## 2018-03-05 DIAGNOSIS — K5790 Diverticulosis of intestine, part unspecified, without perforation or abscess without bleeding: Secondary | ICD-10-CM | POA: Diagnosis present

## 2018-03-05 DIAGNOSIS — Z7901 Long term (current) use of anticoagulants: Secondary | ICD-10-CM

## 2018-03-05 DIAGNOSIS — I10 Essential (primary) hypertension: Secondary | ICD-10-CM

## 2018-03-05 DIAGNOSIS — Z87891 Personal history of nicotine dependence: Secondary | ICD-10-CM

## 2018-03-05 DIAGNOSIS — I7300 Raynaud's syndrome without gangrene: Secondary | ICD-10-CM | POA: Diagnosis present

## 2018-03-05 DIAGNOSIS — D62 Acute posthemorrhagic anemia: Secondary | ICD-10-CM | POA: Diagnosis present

## 2018-03-05 DIAGNOSIS — T45515A Adverse effect of anticoagulants, initial encounter: Secondary | ICD-10-CM | POA: Diagnosis present

## 2018-03-05 DIAGNOSIS — N179 Acute kidney failure, unspecified: Secondary | ICD-10-CM | POA: Diagnosis not present

## 2018-03-05 DIAGNOSIS — I351 Nonrheumatic aortic (valve) insufficiency: Secondary | ICD-10-CM | POA: Diagnosis not present

## 2018-03-05 DIAGNOSIS — R791 Abnormal coagulation profile: Secondary | ICD-10-CM

## 2018-03-05 DIAGNOSIS — Z888 Allergy status to other drugs, medicaments and biological substances status: Secondary | ICD-10-CM

## 2018-03-05 DIAGNOSIS — G4733 Obstructive sleep apnea (adult) (pediatric): Secondary | ICD-10-CM | POA: Diagnosis present

## 2018-03-05 DIAGNOSIS — R0902 Hypoxemia: Secondary | ICD-10-CM | POA: Diagnosis present

## 2018-03-05 DIAGNOSIS — E785 Hyperlipidemia, unspecified: Secondary | ICD-10-CM | POA: Diagnosis present

## 2018-03-05 DIAGNOSIS — G4710 Hypersomnia, unspecified: Secondary | ICD-10-CM | POA: Diagnosis present

## 2018-03-05 DIAGNOSIS — Z6839 Body mass index (BMI) 39.0-39.9, adult: Secondary | ICD-10-CM

## 2018-03-05 DIAGNOSIS — R195 Other fecal abnormalities: Secondary | ICD-10-CM | POA: Diagnosis not present

## 2018-03-05 DIAGNOSIS — I428 Other cardiomyopathies: Secondary | ICD-10-CM | POA: Diagnosis not present

## 2018-03-05 DIAGNOSIS — I480 Paroxysmal atrial fibrillation: Secondary | ICD-10-CM | POA: Diagnosis not present

## 2018-03-05 DIAGNOSIS — Z780 Asymptomatic menopausal state: Secondary | ICD-10-CM

## 2018-03-05 DIAGNOSIS — K922 Gastrointestinal hemorrhage, unspecified: Secondary | ICD-10-CM | POA: Diagnosis not present

## 2018-03-05 HISTORY — DX: Acute posthemorrhagic anemia: D62

## 2018-03-05 HISTORY — DX: Chronic kidney disease, stage 3 unspecified: N18.30

## 2018-03-05 HISTORY — DX: Chronic atrial fibrillation, unspecified: I48.20

## 2018-03-05 HISTORY — DX: Chronic systolic (congestive) heart failure: I50.22

## 2018-03-05 HISTORY — DX: Coagulation defect, unspecified: D68.9

## 2018-03-05 HISTORY — DX: Chronic kidney disease, stage 3 (moderate): N18.3

## 2018-03-05 LAB — CBC
HCT: 28.8 % — ABNORMAL LOW (ref 36.0–46.0)
HCT: 28.5 % — ABNORMAL LOW (ref 36.0–46.0)
Hemoglobin: 9.3 g/dL — ABNORMAL LOW (ref 12.0–15.0)
Hemoglobin: 9.2 g/dL — ABNORMAL LOW (ref 12.0–15.0)
MCH: 29.5 pg (ref 26.0–34.0)
MCH: 28.7 pg (ref 26.0–34.0)
MCHC: 32.3 g/dL (ref 30.0–36.0)
MCHC: 32.3 g/dL (ref 30.0–36.0)
MCV: 91.4 fL (ref 78.0–100.0)
MCV: 88.8 fL (ref 78.0–100.0)
PLATELETS: 146 10*3/uL — AB (ref 150–400)
PLATELETS: 123 10*3/uL — AB (ref 150–400)
RBC: 3.15 MIL/uL — AB (ref 3.87–5.11)
RBC: 3.21 MIL/uL — AB (ref 3.87–5.11)
RDW: 15.5 % (ref 11.5–15.5)
RDW: 16.4 % — ABNORMAL HIGH (ref 11.5–15.5)
WBC: 6.6 10*3/uL (ref 4.0–10.5)
WBC: 6.3 10*3/uL (ref 4.0–10.5)

## 2018-03-05 LAB — COMPREHENSIVE METABOLIC PANEL
ALK PHOS: 34 U/L — AB (ref 38–126)
ALT: 12 U/L — AB (ref 14–54)
AST: 12 U/L — AB (ref 15–41)
Albumin: 3.3 g/dL — ABNORMAL LOW (ref 3.5–5.0)
Anion gap: 11 (ref 5–15)
BUN: 120 mg/dL — AB (ref 6–20)
CALCIUM: 8.7 mg/dL — AB (ref 8.9–10.3)
CO2: 19 mmol/L — AB (ref 22–32)
CREATININE: 4.43 mg/dL — AB (ref 0.44–1.00)
Chloride: 110 mmol/L (ref 101–111)
GFR, EST AFRICAN AMERICAN: 11 mL/min — AB (ref >60)
GFR, EST NON AFRICAN AMERICAN: 10 mL/min — AB (ref >60)
Glucose, Bld: 99 mg/dL (ref 65–99)
Potassium: 4.2 mmol/L (ref 3.5–5.1)
SODIUM: 140 mmol/L (ref 135–145)
Total Bilirubin: 0.5 mg/dL (ref 0.3–1.2)
Total Protein: 6.2 g/dL — ABNORMAL LOW (ref 6.5–8.1)

## 2018-03-05 LAB — PROTIME-INR
INR: 1.54
INR: 1.34
PROTHROMBIN TIME: 89.1 s — AB (ref 11.4–15.2)
PROTHROMBIN TIME: 16.5 s — AB (ref 11.4–15.2)
Prothrombin Time: 18.4 seconds — ABNORMAL HIGH (ref 11.4–15.2)

## 2018-03-05 LAB — PREPARE RBC (CROSSMATCH)

## 2018-03-05 LAB — POC OCCULT BLOOD, ED: FECAL OCCULT BLD: POSITIVE — AB

## 2018-03-05 MED ORDER — VITAMIN K1 10 MG/ML IJ SOLN
10.0000 mg | Freq: Once | INTRAMUSCULAR | Status: AC
  Administered 2018-03-05: 10 mg via INTRAVENOUS
  Filled 2018-03-05: qty 1

## 2018-03-05 MED ORDER — PROTHROMBIN COMPLEX CONC HUMAN 500 UNITS IV KIT
2100.0000 [IU] | PACK | Freq: Once | Status: AC
  Administered 2018-03-05: 2100 [IU] via INTRAVENOUS
  Filled 2018-03-05: qty 2100

## 2018-03-05 MED ORDER — SODIUM CHLORIDE 0.9 % IV SOLN
Freq: Once | INTRAVENOUS | Status: AC
  Administered 2018-03-05: 18:00:00 via INTRAVENOUS

## 2018-03-05 MED ORDER — SODIUM CHLORIDE 0.9% FLUSH
3.0000 mL | Freq: Two times a day (BID) | INTRAVENOUS | Status: DC
  Administered 2018-03-05 – 2018-03-09 (×8): 3 mL via INTRAVENOUS

## 2018-03-05 MED ORDER — ACETAMINOPHEN 650 MG RE SUPP: 650.0000 mg | Freq: Four times a day (QID) | RECTAL | Status: DC | PRN

## 2018-03-05 MED ORDER — PANTOPRAZOLE SODIUM 40 MG IV SOLR
40.0000 mg | Freq: Two times a day (BID) | INTRAVENOUS | Status: DC
  Administered 2018-03-05 – 2018-03-06 (×2): 40 mg via INTRAVENOUS
  Filled 2018-03-05 (×2): qty 40

## 2018-03-05 MED ORDER — ACETAMINOPHEN 325 MG PO TABS
650.0000 mg | ORAL_TABLET | Freq: Four times a day (QID) | ORAL | Status: DC | PRN
  Administered 2018-03-06 – 2018-03-07 (×3): 650 mg via ORAL
  Filled 2018-03-05 (×3): qty 2

## 2018-03-05 MED ORDER — SODIUM CHLORIDE 0.9 % IV SOLN
80.0000 mg | Freq: Once | INTRAVENOUS | Status: AC
  Administered 2018-03-05: 80 mg via INTRAVENOUS
  Filled 2018-03-05: qty 80

## 2018-03-05 MED ORDER — AMIODARONE HCL 200 MG PO TABS
200.0000 mg | ORAL_TABLET | Freq: Every day | ORAL | Status: DC
  Administered 2018-03-06 – 2018-03-10 (×5): 200 mg via ORAL
  Filled 2018-03-05 (×5): qty 1

## 2018-03-05 MED ORDER — ONDANSETRON HCL 4 MG PO TABS: 4.0000 mg | ORAL_TABLET | Freq: Four times a day (QID) | ORAL | Status: DC | PRN

## 2018-03-05 MED ORDER — ONDANSETRON HCL 4 MG/2ML IJ SOLN: 4.0000 mg | Freq: Four times a day (QID) | INTRAMUSCULAR | Status: DC | PRN

## 2018-03-05 NOTE — ED Notes (Signed)
Admitting at bedside 

## 2018-03-05 NOTE — ED Notes (Addendum)
Pt attempted to void with bedpan. Pt unable to void

## 2018-03-05 NOTE — ED Notes (Signed)
This RN spoke with admitting Dr on pts urinary retention. Dr said he will put in an order for an in and out cath. Will recheck pt if retention continues

## 2018-03-05 NOTE — ED Notes (Signed)
1 UNIT BLOOD READY

## 2018-03-05 NOTE — Telephone Encounter (Signed)
Patient called coumadin clinic. Reports bruises in arm and thigh. Also reports some blood in stool.   Noted last INR on 01/25/18 was elevated at 4.1, then patient was "no show" for her INR check on 02/08/18.   I instructed patient to HOLD anticoagulation and go to nearest ER for further evaluation. If she is not admitted today, INR appt was given for 4/15 at 3:30pm.

## 2018-03-05 NOTE — ED Provider Notes (Signed)
[facility] EMERGENCY DEPARTMENT Provider Note   CSN: [id number] Arrival date & time: 03/05/18  1406     History   Chief Complaint Chief Complaint  Patient presents with  . Rectal Bleeding    HPI Patricia Wagner is a 67 y.o. female.  HPI  67 year old female with extensive past medical history as below including nonischemic cardiomyopathy with EF of 20-25%, A. fib on Coumadin, here with rectal bleeding.  Patient states that over the last week, she is had progressive worsening generalized fatigue.  She is had increased lower extremity swelling.  She went to cardiology yesterday, and was noted to have a severe acute kidney injury.  She states that over the last 2 days, she is also had sporadic bruising on her upper and lower extremities without trauma.  Earlier today, the patient began having grossly bloody stools and has had multiple stools throughout the day.  She endorses mild lightheadedness.  No chest pain.  No syncopal episodes.  Denies any abdominal pain.  She does endorse some occasional epigastric pain after eating.  Denies any NSAID or aspirin use.  She is continued taking her Coumadin.  Per review of records, last colonoscopy was in 2012 and she had diverticulosis as well as polyps.  She has not had an EGD.  Past Medical History:  Diagnosis Date  . Arthritis   . Atrial fibrillation ([place])    a. s/p multiple cardioversions; failed tikosyn/sotalol.  Marland Kitchen CAD in native artery, s/p cardiac cath with non obstructive CAD 10/24/2016  . CHF (congestive heart failure) ([place])   . DIVERTICULITIS, HX OF 07/25/2007  . DIVERTICULOSIS, COLON 07/22/2007  . Dyspnea   . Edema, peripheral    a. chronic BLE edema, R>L. Prior trauma from dog attack and accident.  Marland Kitchen HYPERLIPIDEMIA 02/03/2008  . Hypersomnia    declines w/u  . Hypertension   . MENOPAUSAL DISORDER 01/09/2011  . Morbid obesity ([place]) 07/22/2007  . NICM (nonischemic cardiomyopathy) ([place]) 10/24/2016  . Raynaud's syndrome  07/22/2007  . Stroke ([place]) 2017  . THYROID NODULE, RIGHT 01/04/2010  . VITAMIN D DEFICIENCY 01/09/2011   Qualifier: Diagnosis of  By: [name], [name]     Patient Active Problem List   Diagnosis Date Noted  . Urine abnormality 02/02/2018  . Cough 02/02/2018  . Bacteremia due to Gram-negative bacteria 12/30/2017  . Increased anion gap metabolic acidosis 66/44/0347  . Acute renal failure (ARF) ([place]) 12/29/2017  . Metabolic alkalosis 42/59/5638  . Supratherapeutic INR 12/29/2017  . Acute UTI 12/29/2017  . Obstructive uropathy   . Anemia 12/17/2017  . AKI (acute kidney injury) ([place]) 11/30/2017  . Subtherapeutic international normalized ratio (INR) 11/30/2017  . Diarrhea 11/30/2017  . Urinary retention 11/23/2017  . Lower abdominal pain 11/23/2017  . Dysuria 07/21/2017  . Suspected sleep apnea 05/20/2017  . CKD (chronic kidney disease), stage III ([place]) 05/20/2017  . Pressure injury of skin 05/02/2017  . Acute hypercapnic respiratory failure ([place]) 04/30/2017  . Hypotension (arterial) 04/30/2017  . Atrial fibrillation ([place])   . Bradycardia 11/20/2016  . Stroke ([place]) 11/20/2016  . Cerebrovascular accident (CVA) due to embolism of left cerebellar artery ([place])   . Persistent atrial fibrillation ([place]) 11/03/2016  . Paroxysmal atrial fibrillation ([facility])   . CHF (congestive heart failure) ([place]) 10/31/2016  . Nonischemic cardiomyopathy ([place]) 10/24/2016  . CAD in native artery, s/p cardiac cath with non obstructive CAD 10/24/2016  . Tobacco abuse 10/20/2016  . Acute renal failure superimposed on chronic kidney disease (  [place]) 10/20/2016  . Hematochezia 06/29/2013  . Degenerative arthritis of left knee 03/15/2012  . Encounter for well adult exam with abnormal findings 03/12/2012  . Hypersomnia   . VITAMIN D DEFICIENCY 01/09/2011  . MENOPAUSAL DISORDER 01/09/2011  . THYROID NODULE, RIGHT 01/04/2010  . HLD (hyperlipidemia) 02/03/2008  . LEG CRAMPS, NOCTURNAL 02/03/2008  . LOW BACK PAIN  07/25/2007  . Morbid obesity ([place]) 07/22/2007  . Essential hypertension 07/22/2007  . Raynaud's syndrome 07/22/2007  . Diverticulosis of colon with hemorrhage 07/22/2007  . SYMPTOM, EDEMA 07/22/2007    Past Surgical History:  Procedure Laterality Date  . CARDIAC CATHETERIZATION N/A 10/23/2016   Procedure: Left Heart Cath and Coronary Angiography;  Surgeon: [name], MD;  Location: [facility];  Service: Cardiovascular;  Laterality: N/A;  . CARDIOVERSION N/A 10/31/2016   Procedure: CARDIOVERSION;  Surgeon: [name], MD;  Location: [facility];  Service: Cardiovascular;  Laterality: N/A;  . CARDIOVERSION N/A 11/03/2016   Procedure: CARDIOVERSION;  Surgeon: [name], MD;  Location: [facility];  Service: Cardiovascular;  Laterality: N/A;  . CARDIOVERSION N/A 11/18/2016   Procedure: CARDIOVERSION;  Surgeon: [name], MD;  Location: [facility];  Service: Cardiovascular;  Laterality: N/A;  . CARDIOVERSION N/A 03/25/2017   Procedure: CARDIOVERSION;  Surgeon: [name], MD;  Location: [facility] ENDOSCOPY;  Service: Cardiovascular;  Laterality: N/A;  . CARDIOVERSION N/A 05/06/2017   Procedure: CARDIOVERSION;  Surgeon: [name], MD;  Location: [facility] ENDOSCOPY;  Service: Cardiovascular;  Laterality: N/A;  . CARDIOVERSION N/A 05/12/2017   Procedure: CARDIOVERSION;  Surgeon: [name], MD;  Location: [facility] ENDOSCOPY;  Service: Cardiovascular;  Laterality: N/A;  . COLONOSCOPY N/A 12/04/2017   Procedure: COLONOSCOPY;  Surgeon: [name], MD;  Location: [facility];  Service: Endoscopy;  Laterality: N/A;  . COLONOSCOPY W/ POLYPECTOMY  02/2011   pan diverticulosis.  tubular adenoma without dysplasia on 5 mm sigmoid polyp.  Dr Fuller Plan.    Marland Kitchen PARTIAL HYSTERECTOMY     1 OVARY LEFT  . TEE WITHOUT CARDIOVERSION N/A 10/31/2016   Procedure: TRANSESOPHAGEAL ECHOCARDIOGRAM (TEE);  Surgeon: [name], MD;  Location: [facility] ENDOSCOPY;  Service: Cardiovascular;   Laterality: N/A;  . TEE WITHOUT CARDIOVERSION N/A 03/25/2017   Procedure: TRANSESOPHAGEAL ECHOCARDIOGRAM (TEE);  Surgeon: [name], MD;  Location: [facility] ENDOSCOPY;  Service: Cardiovascular;  Laterality: N/A;     OB History   None      Home Medications    Prior to Admission medications   Medication Sig Start Date End Date Taking? Authorizing Provider  acetaminophen (TYLENOL) 325 MG tablet Take 650 mg by mouth every 6 (six) hours as needed (for pain).    Yes [provider]  amiodarone (PACERONE) 200 MG tablet Take 1 tablet (200 mg total) TWICE a day for 2 weeks, then take 1 tablet (200 mg total) ONCE daily. 03/01/18  Yes Camnitz, Will Hassell Done, MD  carvedilol (COREG) 3.125 MG tablet Take 1 tablet (3.125 mg total) by mouth 2 (two) times daily. 07/09/17 03/05/18 Yes [name], MD  HYDROcodone-acetaminophen (NORCO/VICODIN) 5-325 MG tablet Take 1 tablet by mouth every 6 (six) hours as needed for pain. for pain 02/11/18  Yes [provider]  Investigational - Study Medication Take 1 tablet by mouth 2 (two) times daily. Study name: Galactic HF Study  Additional study details: Omecamtiv Mecarbil or Placebo 09/17/17  Yes Larey Dresser, MD  isosorbide-hydrALAZINE (BIDIL) 20-37.5 MG tablet Take 1 tablet by mouth 3 (three) times  daily. 10/20/17  Yes [name], MD  ranolazine (RANEXA) 500 MG 12 hr tablet Take 1 tablet (500 mg total) by mouth 2 (two) times daily. 09/14/17  Yes Bensimhon, Shaune Pascal, MD  spironolactone (ALDACTONE) 25 MG tablet Take 25 mg by mouth daily. 03/04/18  Yes [provider]  warfarin (COUMADIN) 3 MG tablet Take 1 tablet (3 mg total) by mouth daily. Patient taking differently: Take 3 mg by mouth one time only at 6 PM.  03/01/18  Yes [name], Denice Bors, MD    Family History Family History  Problem Relation Age of Onset  . Asthma Mother   . Diabetes Father   . Heart disease Father        Died of presumed heart attack - 70s  . Lung disease  Sister   . Heart disease Sister        Twin sister has heart issue, unclear what kind  . Thyroid disease Neg Hx     Social History Social History   Tobacco Use  . Smoking status: Former Smoker    Packs/day: 0.50    Types: Cigarettes    Last attempt to quit: 10/23/2016    Years since quitting: 1.3  . Smokeless tobacco: Never Used  . Tobacco comment: Smoked for 50 years  Substance Use Topics  . Alcohol use: No    Alcohol/week: 2.4 oz    Types: 4 Cans of beer per week    Frequency: Never    Comment: Intermittent  . Drug use: No     Allergies   Ace inhibitors   Review of Systems Review of Systems  Constitutional: Positive for fatigue.  Gastrointestinal: Positive for blood in stool and nausea.  Neurological: Positive for weakness and light-headedness.  All other systems reviewed and are negative.    Physical Exam Updated Vital Signs BP 118/66   Pulse (!) 55   Temp 98.2 F (36.8 C) (Oral)   Resp 14   SpO2 96%   Physical Exam  Constitutional: She is oriented to person, place, and time. She appears well-developed and well-nourished. No distress.  HENT:  Head: Normocephalic and atraumatic.  Eyes: Conjunctivae are normal.  Neck: Neck supple.  Cardiovascular: Normal rate, regular rhythm and normal heart sounds. Exam reveals no friction rub.  No murmur heard. Pulmonary/Chest: Effort normal and breath sounds normal. No respiratory distress. She has no wheezes. She has no rales.  Abdominal: She exhibits no distension.  Genitourinary:  Genitourinary Comments: Grossly bloody stool noted in rectal vault.  Musculoskeletal: She exhibits no edema.  3+ pitting edema bilateral lower extremities  Neurological: She is alert and oriented to person, place, and time. She exhibits normal muscle tone.  Skin: Skin is warm. Capillary refill takes less than 2 seconds.  Psychiatric: She has a normal mood and affect.  Nursing note and vitals reviewed.    ED Treatments / Results    Labs (all labs ordered are listed, but only abnormal results are displayed) Labs Reviewed  COMPREHENSIVE METABOLIC PANEL - Abnormal; Notable for the following components:      Result Value   CO2 19 (*)    BUN 120 (*)    Creatinine, Ser 4.43 (*)    Calcium 8.7 (*)    Total Protein 6.2 (*)    Albumin 3.3 (*)    AST 12 (*)    ALT 12 (*)    Alkaline Phosphatase 34 (*)    GFR calc non Af Amer 10 (*)    GFR  calc Af Amer 11 (*)    All other components within normal limits  CBC - Abnormal; Notable for the following components:   RBC 3.15 (*)    Hemoglobin 9.3 (*)    HCT 28.8 (*)    Platelets 146 (*)    All other components within normal limits  PROTIME-INR - Abnormal; Notable for the following components:   Prothrombin Time 89.1 (*)    INR >10.00 (*)    All other components within normal limits  POC OCCULT BLOOD, ED - Abnormal; Notable for the following components:   Fecal Occult Bld POSITIVE (*)    All other components within normal limits  PROTIME-INR  PROTIME-INR  TYPE AND SCREEN  PREPARE RBC (CROSSMATCH)    EKG None  Radiology No results found.  Procedures .Critical Care Performed by: Duffy Bruce, MD Authorized by: Duffy Bruce, MD   Critical care provider statement:    Critical care time (minutes):  35   Critical care time was exclusive of:  Separately billable procedures and treating other patients and teaching time   Critical care was necessary to treat or prevent imminent or life-threatening deterioration of the following conditions:  Circulatory failure, shock and cardiac failure   Critical care was time spent personally by me on the following activities:  Development of treatment plan with patient or surrogate, discussions with consultants, evaluation of patient's response to treatment, examination of patient, obtaining history from patient or surrogate, ordering and performing treatments and interventions, ordering and review of laboratory studies,  ordering and review of radiographic studies, pulse oximetry, re-evaluation of patient's condition and review of old charts   I assumed direction of critical care for this patient from another provider in my specialty: no     (including critical care time)  Medications Ordered in ED Medications  0.9 %  sodium chloride infusion (has no administration in time range)  pantoprazole (PROTONIX) 80 mg in sodium chloride 0.9 % 100 mL IVPB (has no administration in time range)  prothrombin complex conc human (KCENTRA) IVPB 2,100 Units (2,100 Units Intravenous New Bag/Given 03/05/18 1656)  phytonadione (VITAMIN K) 10 mg in dextrose 5 % 50 mL IVPB (10 mg Intravenous New Bag/Given 03/05/18 1656)     Initial Impression / Assessment and Plan / ED Course  I have reviewed the triage vital signs and the nursing notes.  Pertinent labs & imaging results that were available during my care of the patient were reviewed by me and considered in my medical decision making (see chart for details).     67 year old female here with bright red rectal bleeding in the setting of markedly supratherapeutic INR, with general fatigue.  INR greater than 10.  Hemoglobin is stable.  Patient also has acute on chronic kidney injury with markedly elevated BUN.  Concern for GI bleed in the setting of supratherapeutic INR as well as possible platelet dysfunction from her uremia.  Will give blood given patient's active bleeding with very poor EF, as I am hesitant to fluid overload her and I suspect she will need transfusions primarily.  Given her active bleed and borderline hypotension, will reverse her with K [facility]ra.  I have consulted low power GI who will see the patient.  I also placed a call to patient's cardiologist to make him aware of her visit.  Will admit to stepdown for further management.  Blood pressure stable to improving.  K Centra given.  Unit of blood is running.  Patient appears stable currently.   Final  Clinical  Impressions(s) / ED Diagnoses   Final diagnoses:  Rectal bleed  Supratherapeutic INR  AKI (acute kidney injury) ([place])      Duffy Bruce, MD 03/05/18 1734

## 2018-03-05 NOTE — ED Notes (Addendum)
Bladder scan 999+

## 2018-03-05 NOTE — Telephone Encounter (Signed)
Result Notes for Basic metabolic panel   Notes recorded by Darron Doom, RN on 03/05/2018 at 12:17 PM EDT Patient called back and she is agreeable to go to the ER. She is aware to stop lasix and not start Entresto. ------  Notes recorded by Darron Doom, RN on 03/05/2018 at 8:53 AM EDT Called patient again and left another VM. ------  Notes recorded by Darron Doom, RN on 03/05/2018 at 8:22 AM EDT Called patient and left message to call me back as soon as possible regarding her labs. I will continue to try and reach patient this morning. ------  Notes recorded by [name], MD on 03/04/2018 at 6:16 PM EDT Stop lasix. Do not start entresto. Likely needs to come to ER in am to evaluate for obstructive uropathy.

## 2018-03-05 NOTE — H&P (Addendum)
History and Physical    Patricia Wagner IOX:[id number] DOB: 1951/02/20 DOA: 03/05/2018  **Will admit patient based on the expectation that the patient will need hospitalization/ hospital care that crosses at least 2 midnights  PCP: Biagio Borg, MD   Attending physician: Alfredia Ferguson  Patient coming from/Resides with: Private residence/alone  Chief Complaint: Dark blood per rectum  HPI: Patricia Wagner is a 67 y.o. female with medical history significant for atrial fibrillation on chronic anticoagulation with warfarin, history of chronic atrial fibrillation maintaining sinus rhythm on amiodarone, nonischemic cardiomyopathy with severe concentric LVH with initial echocardiogram when diagnosed EF 15% now EF 30-35% on echo August 2018, known diverticulosis on CT, history of GI bleed on warfarin, chronic kidney disease stage III, hypertension, arthritis, obesity.  Patient was seen by EP physician on 4/8 with recommendations to continue current medications and consideration given to ablation once heart failure better stabilized.  Followed up with Dr. Haroldine Wagner at the heart failure clinic on 4/12.  Labs were obtained that revealed severe AKI-labs were not returned until today and patient has subsequently been sent to the ER.  Prior to being sent to the ER she was instructed to stop her Lasix and Entresto.  Of note on yesterday's exam she was reported with some lower extremity edema but no JVD.  No S3 on exam.  Her Lasix was continued and low-dose Entresto had been restarted.  TED hose were also recommended.  Patient presented to the ER today after having 2 large black sticky stools.  She has been having some mid upper abdominal discomfort with reflux.  She had previously been instructed to present to the ER as noted because of acute kidney injury and other lab abnormalities.  On initial presentation here her blood pressure was 88/53 with an MAP of 64.  Repeat blood pressure 106/64 when supine.  She was not hypoxic.   Repeat labs here revealed a significant kidney injury with a creatinine of 4.43 and a BUN of 120, LFTs including total bilirubin were normal.  Hemoglobin was 9.3 and platelets 146,000.  Patient had significant coagulopathy with PT 89.1 and INR greater than 10.00.  Stools were Hemoccult positive.  He also had significant 3-4+ bilateral lower extremity edema right greater than left.  Because she was exhibiting signs of active bleeding she has been given vitamin K as well as a dose of Kcentra with pharmacy assisting with dosing and follow-up.  EDP has requested gastroenterology and cardiology consultation.  She has been given a dose of Protonix 80 mg IV.  EDP has also ordered 1 unit of PRBCs to infuse.  ED Course:  Vital Signs: BP 116/62   Pulse (!) 57   Temp 98.2 F (36.8 C) (Oral)   Resp (!) 21   SpO2 98%  Lab data: Sodium 140, potassium 4.2, chloride 110, CO2 19, glucose 99, BUN 120, creatinine 4.43, calcium 8.7, anion gap 11, LFTs not elevated, white count 6600 differential not obtained, hemoglobin 9.3, platelets 146,000, PT 89.1, INR > 10.00, FOB positive Medications and treatments: Protonix, vitamin K, Kcentra and packed red blood cells as above  Review of Systems:  In addition to the HPI above,  No Fever-chills, myalgias or other constitutional symptoms No Headache, changes with Vision or hearing, new weakness, tingling, numbness in any extremity, dizziness, dysarthria or word finding difficulty, gait disturbance or imbalance, tremors or seizure activity No problems swallowing food or Liquids, choking or coughing while eating, abdominal pain with or after eating No Chest  pain, Cough or Shortness of Breath, palpitations, orthopnea or DOE No N/V; typically has BM daily but patient reports last normal BM 3 days ago-significant flatus since onset of dark stools No dysuria, malodorous urine, hematuria or flank pain No new skin rashes, lesions, masses; she has developed spontaneous bruising of  right shoulder, bilateral thighs, and right knee in the past 24 hours No new joint pains, aches, swelling or redness No recent unintentional weight loss No polyuria, polydypsia or polyphagia   Past Medical History:  Diagnosis Date  . Arthritis   . Atrial fibrillation ([place])    a. s/p multiple cardioversions; failed tikosyn/sotalol.  Marland Kitchen CAD in native artery, s/p cardiac cath with non obstructive CAD 10/24/2016  . CHF (congestive heart failure) ([place])   . CKD (chronic kidney disease) stage 3, GFR 30-59 ml/min ([facility])   . DIVERTICULITIS, HX OF 07/25/2007  . DIVERTICULOSIS, COLON 07/22/2007  . Dyspnea   . Edema, peripheral    a. chronic BLE edema, R>L. Prior trauma from dog attack and accident.  Marland Kitchen HYPERLIPIDEMIA 02/03/2008  . Hypersomnia    declines w/u  . Hypertension   . MENOPAUSAL DISORDER 01/09/2011  . Morbid obesity ([place]) 07/22/2007  . NICM (nonischemic cardiomyopathy) ([place]) 10/24/2016  . Raynaud's syndrome 07/22/2007  . Stroke ([place]) 2017  . THYROID NODULE, RIGHT 01/04/2010  . VITAMIN D DEFICIENCY 01/09/2011   Qualifier: Diagnosis of  By: [name], [name]     Past Surgical History:  Procedure Laterality Date  . CARDIAC CATHETERIZATION N/A 10/23/2016   Procedure: Left Heart Cath and Coronary Angiography;  Surgeon: [name], MD;  Location: [place] [facility];  Service: Cardiovascular;  Laterality: N/A;  . CARDIOVERSION N/A 10/31/2016   Procedure: CARDIOVERSION;  Surgeon: [name], MD;  Location: [place];  Service: Cardiovascular;  Laterality: N/A;  . CARDIOVERSION N/A 11/03/2016   Procedure: CARDIOVERSION;  Surgeon: [name], MD;  Location: [place];  Service: Cardiovascular;  Laterality: N/A;  . CARDIOVERSION N/A 11/18/2016   Procedure: CARDIOVERSION;  Surgeon: [name], MD;  Location: [place];  Service: Cardiovascular;  Laterality: N/A;  . CARDIOVERSION N/A 03/25/2017   Procedure: CARDIOVERSION;  Surgeon: [name], MD;  Location[facility]  ENDOSCOPY;  Service: Cardiovascular;  Laterality: N/A;  . CARDIOVERSION N/A 05/06/2017   Procedure: CARDIOVERSION;  Surgeon: [name], MD;  Location: [facility] ENDOSCOPY;  Service: Cardiovascular;  Laterality: N/A;  . CARDIOVERSION N/A 05/12/2017   Procedure: CARDIOVERSION;  Surgeon: [name], MD;  Location: [facility] ENDOSCOPY;  Service: Cardiovascular;  Laterality: N/A;  . COLONOSCOPY N/A 12/04/2017   Procedure: COLONOSCOPY;  Surgeon: [name], MD;  Location: [place];  Service: Endoscopy;  Laterality: N/A;  . COLONOSCOPY W/ POLYPECTOMY  02/2011   pan diverticulosis.  tubular adenoma without dysplasia on 5 mm sigmoid polyp.  Dr Fuller Plan.    Marland Kitchen PARTIAL HYSTERECTOMY     1 OVARY LEFT  . TEE WITHOUT CARDIOVERSION N/A 10/31/2016   Procedure: TRANSESOPHAGEAL ECHOCARDIOGRAM (TEE);  Surgeon: [name], MD;  Location: [facility] ENDOSCOPY;  Service: Cardiovascular;  Laterality: N/A;  . TEE WITHOUT CARDIOVERSION N/A 03/25/2017   Procedure: TRANSESOPHAGEAL ECHOCARDIOGRAM (TEE);  Surgeon: [name], MD;  Location: [facility] ENDOSCOPY;  Service: Cardiovascular;  Laterality: N/A;    Social History   Socioeconomic History  . Marital status: Single    Spouse name: Not on file  . Number of children: 1  . Years of education: Not on file  . Highest education level: Not  on file  Occupational History  . Occupation: Engineer, manufacturing  . Financial resource strain: Not on file  . Food insecurity:    Worry: Not on file    Inability: Not on file  . Transportation needs:    Medical: Not on file    Non-medical: Not on file  Tobacco Use  . Smoking status: Former Smoker    Packs/day: 0.50    Types: Cigarettes    Last attempt to quit: 10/23/2016    Years since quitting: 1.3  . Smokeless tobacco: Never Used  . Tobacco comment: Smoked for 50 years  Substance and Sexual Activity  . Alcohol use: No    Alcohol/week: 2.4 oz    Types: 4 Cans of beer per week    Frequency: Never    Comment:  Intermittent  . Drug use: No  . Sexual activity: Not on file  Lifestyle  . Physical activity:    Days per week: Not on file    Minutes per session: Not on file  . Stress: Not on file  Relationships  . Social connections:    Talks on phone: Not on file    Gets together: Not on file    Attends religious service: Not on file    Active member of club or organization: Not on file    Attends meetings of clubs or organizations: Not on file    Relationship status: Not on file  . Intimate partner violence:    Fear of current or ex partner: Not on file    Emotionally abused: Not on file    Physically abused: Not on file    Forced sexual activity: Not on file  Other Topics Concern  . Not on file  Social History Narrative  . Not on file    Mobility: Utilizes a cane Work history: Not obtained   Allergies  Allergen Reactions  . Ace Inhibitors Palpitations    Family History  Problem Relation Age of Onset  . Asthma Mother   . Diabetes Father   . Heart disease Father        Died of presumed heart attack - 29s  . Lung disease Sister   . Heart disease Sister        Twin sister has heart issue, unclear what kind  . Thyroid disease Neg Hx      Prior to Admission medications   Medication Sig Start Date End Date Taking? Authorizing Provider  acetaminophen (TYLENOL) 325 MG tablet Take 650 mg by mouth every 6 (six) hours as needed (for pain).    Yes [provider]  amiodarone (PACERONE) 200 MG tablet Take 1 tablet (200 mg total) TWICE a day for 2 weeks, then take 1 tablet (200 mg total) ONCE daily. 03/01/18  Yes Camnitz, Will Hassell Done, MD  carvedilol (COREG) 3.125 MG tablet Take 1 tablet (3.125 mg total) by mouth 2 (two) times daily. 07/09/17 03/05/18 Yes [name], MD  HYDROcodone-acetaminophen (NORCO/VICODIN) 5-325 MG tablet Take 1 tablet by mouth every 6 (six) hours as needed for pain. for pain 02/11/18  Yes [provider]  Investigational - Study Medication Take  1 tablet by mouth 2 (two) times daily. Study name: Galactic HF Study  Additional study details: Omecamtiv Mecarbil or Placebo 09/17/17  Yes Larey Dresser, MD  isosorbide-hydrALAZINE (BIDIL) 20-37.5 MG tablet Take 1 tablet by mouth 3 (three) times daily. 10/20/17  Yes [name], MD  ranolazine (RANEXA) 500 MG 12 hr tablet Take  1 tablet (500 mg total) by mouth 2 (two) times daily. 09/14/17  Yes Bensimhon, Shaune Pascal, MD  spironolactone (ALDACTONE) 25 MG tablet Take 25 mg by mouth daily. 03/04/18  Yes [provider]  warfarin (COUMADIN) 3 MG tablet Take 1 tablet (3 mg total) by mouth daily. Patient taking differently: Take 3 mg by mouth one time only at 6 PM.  03/01/18  Yes [name], MD    Physical Exam: Vitals:   03/05/18 1715 03/05/18 1730 03/05/18 1745 03/05/18 1800  BP: 102/71 118/66 109/64 116/62  Pulse: (!) 54 (!) 55 (!) 55 (!) 57  Resp: 17 14 17  (!) 21  Temp:      TempSrc:      SpO2: 95% 96% 95% 98%      Constitutional: NAD, calm, comfortable Eyes: PERRL, lids and conjunctivae normal-bilateral darkening of sclera/scleral icterus ENMT: Mucous membranes are moist. Posterior pharynx clear of any exudate or lesions.Normal dentition.  Neck: normal, supple, no masses, no thyromegaly Respiratory: clear to auscultation bilaterally, no wheezing, no crackles. Normal respiratory effort. No accessory muscle use.  Cardiovascular: Regular rate and rhythm, no murmurs / rubs / gallops.  Marked bilateral 3-4+ lower extremity edema. 1+ pedal pulses. No carotid bruits.  Hands and feet cool to touch with greater than 2-second capillary refill Abdomen: Focal mid upper abdominal discomfort with palpation without guarding or rebounding no masses palpated. No hepatosplenomegaly. Bowel sounds positive.  Musculoskeletal: no clubbing / cyanosis. No joint deformity upper and lower extremities. Good ROM, no contractures. Normal muscle tone.  Skin: no rashes, lesions, ulcers. No  induration Neurologic: CN 2-12 grossly intact. Sensation intact, DTR normal. Strength 5/5 x all 4 extremities.  Psychiatric: Normal judgment and insight. Alert and oriented x 3. Normal mood.    Labs on Admission: I have personally reviewed following labs and imaging studies  CBC: Recent Labs  Lab 03/05/18 1454  WBC 6.6  HGB 9.3*  HCT 28.8*  MCV 91.4  PLT 536*   Basic Metabolic Panel: Recent Labs  Lab 03/04/18 1512 03/05/18 1454  NA 140 140  K 3.8 4.2  CL 108 110  CO2 21* 19*  GLUCOSE 105* 99  BUN 98* 120*  CREATININE 4.30* 4.43*  CALCIUM 8.8* 8.7*   GFR: Estimated Creatinine Clearance: 15.8 mL/min (A) (by C-G formula based on SCr of 4.43 mg/dL (H)). Liver Function Tests: Recent Labs  Lab 03/05/18 1454  AST 12*  ALT 12*  ALKPHOS 34*  BILITOT 0.5  PROT 6.2*  ALBUMIN 3.3*   No results for input(s): LIPASE, AMYLASE in the last 168 hours. No results for input(s): AMMONIA in the last 168 hours. Coagulation Profile: Recent Labs  Lab 03/05/18 1454  INR >10.00*   Cardiac Enzymes: No results for input(s): CKTOTAL, CKMB, CKMBINDEX, TROPONINI in the last 168 hours. BNP (last 3 results) No results for input(s): PROBNP in the last 8760 hours. HbA1C: No results for input(s): HGBA1C in the last 72 hours. CBG: No results for input(s): GLUCAP in the last 168 hours. Lipid Profile: No results for input(s): CHOL, HDL, LDLCALC, TRIG, CHOLHDL, LDLDIRECT in the last 72 hours. Thyroid Function Tests: No results for input(s): TSH, T4TOTAL, FREET4, T3FREE, THYROIDAB in the last 72 hours. Anemia Panel: No results for input(s): VITAMINB12, FOLATE, FERRITIN, TIBC, IRON, RETICCTPCT in the last 72 hours. Urine analysis:    Component Value Date/Time   COLORURINE YELLOW 02/02/2018 1700   APPEARANCEUR [place] (A) 02/02/2018 1700   LABSPEC 1.020 02/02/2018 1700   PHURINE  6.0 02/02/2018 1700   GLUCOSEU NEGATIVE 02/02/2018 1700   HGBUR TRACE-INTACT (A) 02/02/2018 1700    BILIRUBINUR NEGATIVE 02/02/2018 1700   KETONESUR NEGATIVE 02/02/2018 1700   PROTEINUR 100 (A) 12/29/2017 1311   UROBILINOGEN 0.2 02/02/2018 1700   NITRITE POSITIVE (A) 02/02/2018 1700   LEUKOCYTESUR SMALL (A) 02/02/2018 1700   Sepsis Labs: @LABRCNTIP (procalcitonin:4,lacticidven:4) )No results found for this or any previous visit (from the past 240 hour(s)).   Radiological Exams on Admission: No results found.  EKG: (Independently reviewed) sinus rhythm with ventricular rate 63 bpm, QTC 465 ms, normal R wave rotation, no acute ischemic changes, borderline Q waves in inferior leads  Assessment/Plan Principal Problem:   Acute kidney injury on CKD (chronic kidney disease) stage 3, GFR 30-59 ml/min -Patient has developed in the past 48 hours worsening of renal function in the context of preadmission utilization of Entresto and Lasix; suspected that occult/initial onset of GI bleeding with associated likely hypotension i.e. decreased renal perfusion also contributing -Baseline renal function: 7/1.44 -Current renal function: 120/4.43 -Patient previously instructed to hold Entresto and Lasix; addition because of suboptimal blood pressure readings and hypotension at presentation we will also hold carvedilol and Ranexa -Heart elevation in BUN over rapid timeframe without altered mentation more suggestive of acute process such as GI bleeding/azotemia -Bladder scan due to history of urinary retention -Renal ultrasound to better quantify her medical renal disease and rule out obstructive process/hydronephrosis -Follow labs and avoid nephrotoxic medications -Treat underlying causes -Potentially acute systolic heart failure also contributing to acute renal dysfunction  Active Problems:   Coagulopathy/Thrombocytopenia -Patient presents with a therapeutic INR > 10.00 -Reversal agents of vitamin K and K Centra given with pharmacy managing follow-up lab -Continue to hold warfarin -Low platelets  likely reflective of bone marrow consumption with acute evolving anemia    Acute blood loss anemia -Baseline hemoglobin around 8.4 current hemoglobin 9.3 but do not suspect patient has equilibrated since bleeding onset only several hours prior -Agree with EDP decision to give at least 1 unit PRBCs -Order placed for RN to obtain CBC after blood infused-may require additional PRBCs in the next 24 hours -Her Hemoccult positive and agree with GI consultation (see below) -No back pain but could be experiencing spontaneous retroperitoneal hemorrhage continue to monitor-may require additional imaging    NICM (nonischemic cardiomyopathy)/Chronic systolic heart failure  -History of biventricular heart failure [name] diagnosed May 2018 noting EF at that time 15% with improvement to 30-35% on follow-up echo -Echocardiogram August 2018: Severe concentric LVH with an EF of 30-35% -Has significant bilateral lower extremity edema and weight is up to 242 lbss from dry weight reported at 220 lbs -Given acute kidney injury and relative hypotension all home heart failure meds have been placed on hold (Lasix, Entresto and carvedilol) -Defer to cardiology regarding management and treatment of heart failure-during previous admissions has required IV dobutamine -Daily weights, strict I's/O -Repeat echocardiogram this admission -Currently maintaining sinus rhythm-previous heart failure felt to be tachycardia mediated in the context of atrial fibrillation with RVR -Normal cardiology consultation pending    Chronic atrial fibrillation  -Maintaining sinus rhythm on amiodarone-continue this medication with sip of water while NPO less otherwise contraindicated -Warfarin on hold in context of acute GI bleeding -CHA2DS2-VASc Score and unadjusted Ischemic Stroke Rate (% per year) is equal to 3.2 % stroke rate/year from a score of 3    Diverticulosis/Heme positive stool -Patient with known diverticulosis based on prior CT  the abdomen -Reports history of prior GI  bleeding -Colonoscopy 12/04/17 revealed normal exam noting diverticulosis of the large intestine without perforation or abscess signs of active bleeding -Upper abdominal pain therefore bleeding could be upper GI or small bowel related -Continue Protonix 40 mg IV every 12 hours -Formal GI consultation pending -NPO until diet clarified by GI     Hypertension -Current blood pressure suboptimal and cardiac medications on hold as above    **Additional lab, imaging and/or diagnostic evaluation at discretion of supervising physician  DVT prophylaxis: SCDs Code Status: Full Family Communication: No family at bedside Disposition Plan: Home Consults called: EDP has consulted Cardiology/Bensimhon; GI/Gupta    ELLIS,ALLISON L. ANP-BC [facility] Pager [phone]   If 7PM-7AM, please contact night-coverage [email or web address] Password [facility]  03/05/2018, 6:14 PM

## 2018-03-05 NOTE — ED Triage Notes (Signed)
Patient complains of bright red blood in her stool today. Takes coumadin. Alert and oriented at this time.

## 2018-03-06 ENCOUNTER — Inpatient Hospital Stay (HOSPITAL_COMMUNITY): Payer: BLUE CROSS/BLUE SHIELD

## 2018-03-06 ENCOUNTER — Other Ambulatory Visit: Payer: Self-pay

## 2018-03-06 DIAGNOSIS — K625 Hemorrhage of anus and rectum: Secondary | ICD-10-CM

## 2018-03-06 DIAGNOSIS — D689 Coagulation defect, unspecified: Secondary | ICD-10-CM

## 2018-03-06 DIAGNOSIS — N179 Acute kidney failure, unspecified: Secondary | ICD-10-CM

## 2018-03-06 DIAGNOSIS — R338 Other retention of urine: Secondary | ICD-10-CM

## 2018-03-06 DIAGNOSIS — D62 Acute posthemorrhagic anemia: Secondary | ICD-10-CM

## 2018-03-06 DIAGNOSIS — I5022 Chronic systolic (congestive) heart failure: Secondary | ICD-10-CM

## 2018-03-06 DIAGNOSIS — I351 Nonrheumatic aortic (valve) insufficiency: Secondary | ICD-10-CM

## 2018-03-06 DIAGNOSIS — I482 Chronic atrial fibrillation: Secondary | ICD-10-CM

## 2018-03-06 LAB — URINALYSIS, ROUTINE W REFLEX MICROSCOPIC
Bilirubin Urine: NEGATIVE
Glucose, UA: NEGATIVE mg/dL
Hgb urine dipstick: NEGATIVE
Ketones, ur: NEGATIVE mg/dL
Nitrite: POSITIVE — AB
Protein, ur: NEGATIVE mg/dL
SPECIFIC GRAVITY, URINE: 1.012 (ref 1.005–1.030)
pH: 8 (ref 5.0–8.0)

## 2018-03-06 LAB — TYPE AND SCREEN
ABO/RH(D): AB POS
Antibody Screen: NEGATIVE
Unit division: 0

## 2018-03-06 LAB — COMPREHENSIVE METABOLIC PANEL
ALT: 13 U/L — ABNORMAL LOW (ref 14–54)
ANION GAP: 10 (ref 5–15)
AST: 15 U/L (ref 15–41)
Albumin: 3.1 g/dL — ABNORMAL LOW (ref 3.5–5.0)
Alkaline Phosphatase: 33 U/L — ABNORMAL LOW (ref 38–126)
BUN: 117 mg/dL — ABNORMAL HIGH (ref 6–20)
CALCIUM: 8.6 mg/dL — AB (ref 8.9–10.3)
CHLORIDE: 115 mmol/L — AB (ref 101–111)
CO2: 17 mmol/L — ABNORMAL LOW (ref 22–32)
CREATININE: 4.28 mg/dL — AB (ref 0.44–1.00)
GFR calc Af Amer: 12 mL/min — ABNORMAL LOW (ref >60)
GFR, EST NON AFRICAN AMERICAN: 10 mL/min — AB (ref >60)
GLUCOSE: 84 mg/dL (ref 65–99)
Potassium: 3.7 mmol/L (ref 3.5–5.1)
Sodium: 142 mmol/L (ref 135–145)
Total Bilirubin: 1.2 mg/dL (ref 0.3–1.2)
Total Protein: 5.6 g/dL — ABNORMAL LOW (ref 6.5–8.1)

## 2018-03-06 LAB — CBC WITH DIFFERENTIAL/PLATELET
BASOS PCT: 0 %
Basophils Absolute: 0 10*3/uL (ref 0.0–0.1)
Eosinophils Absolute: 0.1 10*3/uL (ref 0.0–0.7)
Eosinophils Relative: 2 %
HEMATOCRIT: 29.2 % — AB (ref 36.0–46.0)
HEMOGLOBIN: 9.6 g/dL — AB (ref 12.0–15.0)
LYMPHS ABS: 1.3 10*3/uL (ref 0.7–4.0)
LYMPHS PCT: 20 %
MCH: 29.5 pg (ref 26.0–34.0)
MCHC: 32.9 g/dL (ref 30.0–36.0)
MCV: 89.8 fL (ref 78.0–100.0)
MONOS PCT: 6 %
Monocytes Absolute: 0.4 10*3/uL (ref 0.1–1.0)
NEUTROS ABS: 4.8 10*3/uL (ref 1.7–7.7)
NEUTROS PCT: 72 %
Platelets: 126 10*3/uL — ABNORMAL LOW (ref 150–400)
RBC: 3.25 MIL/uL — ABNORMAL LOW (ref 3.87–5.11)
RDW: 16.8 % — ABNORMAL HIGH (ref 11.5–15.5)
WBC: 6.6 10*3/uL (ref 4.0–10.5)

## 2018-03-06 LAB — HEMOGLOBIN AND HEMATOCRIT, BLOOD
HCT: 28 % — ABNORMAL LOW (ref 36.0–46.0)
HCT: 27.9 % — ABNORMAL LOW (ref 36.0–46.0)
Hemoglobin: 9 g/dL — ABNORMAL LOW (ref 12.0–15.0)
Hemoglobin: 9.1 g/dL — ABNORMAL LOW (ref 12.0–15.0)

## 2018-03-06 LAB — PROTIME-INR
INR: 1.26
INR: 1.17
INR: 1.15
PROTHROMBIN TIME: 14.6 s (ref 11.4–15.2)
Prothrombin Time: 15.7 s — ABNORMAL HIGH (ref 11.4–15.2)
Prothrombin Time: 14.8 seconds (ref 11.4–15.2)

## 2018-03-06 LAB — BPAM RBC
Blood Product Expiration Date: 201904222359
ISSUE DATE / TIME: 201904121820
UNIT TYPE AND RH: 8400

## 2018-03-06 LAB — MRSA PCR SCREENING: MRSA by PCR: NEGATIVE

## 2018-03-06 LAB — PHOSPHORUS: PHOSPHORUS: 3.8 mg/dL (ref 2.5–4.6)

## 2018-03-06 LAB — ECHOCARDIOGRAM COMPLETE

## 2018-03-06 LAB — MAGNESIUM: Magnesium: 2 mg/dL (ref 1.7–2.4)

## 2018-03-06 LAB — APTT: aPTT: 25 seconds (ref 24–36)

## 2018-03-06 MED ORDER — PANTOPRAZOLE SODIUM 40 MG IV SOLR
40.0000 mg | INTRAVENOUS | Status: DC
  Administered 2018-03-07 – 2018-03-08 (×2): 40 mg via INTRAVENOUS
  Filled 2018-03-06 (×2): qty 40

## 2018-03-06 MED ORDER — FENTANYL CITRATE (PF) 100 MCG/2ML IJ SOLN: 25.0000 ug | INTRAMUSCULAR | Status: DC | PRN

## 2018-03-06 NOTE — ED Notes (Signed)
RN walking in pt room due to sats in the 60's RN awakened pt and told them to take a deep breath. Pt did and sats stayed in the 70's for about a min RN was about to place pt on Nasal cannula but pt refused.

## 2018-03-06 NOTE — Progress Notes (Signed)
PROGRESS NOTE    Patricia Wagner  WFU:[id number] DOB: 1950-12-13 DOA: 03/05/2018 PCP: Biagio Borg, MD   Brief Narrative:  The patient is a very pleasant 67 yo Morbidly Obese AAF with a PMH of Atrial Fibrillation s/p Multiple DCCV Cardioversions on Anticoagulation with Warfarin, NICM with Chronic Biventricular CHF, Hx of Diverticulosis, HTN, HLD, Hx of Urinary Retention and UTI, Raynaud's Syndrome, CKD Stage 3 with Baseline Cr of 1.3-1.5, Hx of Recurrent Cardiogenic Shock and other comorbidities who presented to [facility] ED because of Dark Tarry Stools that started this Am.  Upon discussion with the patient she saw her Primary Heart Failure Cardiologist [name] yesterday who evaluated her for her Heart Failure. She had blood work drawn at the office and which revealed a severe derangement in Her Kidneys. Of note she was found to be more fatigued and this AM when she bent over to tie her shoe became lightheaded.   She presented to [facility] ED and was found to have Elevated INR and FOBT Positive so she was given Kcentra, Vitamin K, and transfused 1 unit. She is being admitted for AKI on CKD Stage 3, Suspected Upper GIB in the setting of Coagulopathy from Coumadin Toxicity, Acute Exacerbation of Chronic Biventricular CHF.   Assessment & Plan:   Principal Problem:   Acute kidney injury ([place]) Active Problems:   CKD (chronic kidney disease) stage 3, GFR 30-59 ml/min (HCC)   Coagulopathy (HCC)   NICM (nonischemic cardiomyopathy) (HCC)   Chronic systolic (congestive) heart failure (HCC)   Chronic atrial fibrillation (HCC)   Hypertension   Acute blood loss anemia   Diverticulosis   Heme positive stool   Rectal bleed  Acute kidney injury on CKD (chronic kidney disease) stage 3, GFR 30-59 ml/min -Patient has developed in the past 48 hours worsening of renal function in the context of preadmission utilization of Entresto and Lasix; suspected that occult/initial onset of GI bleeding with  associated likely hypotension i.e. decreased renal perfusion also contributing but now feel like it is mostly obstructive Uropathy  -Baseline renal function: 7/1.44 -Current renal function: Improved slightly from 120/4.43 -> 117/4.28 -Patient previously instructed to hold Entresto and Lasix; -Heart elevation in BUN over rapid timeframe without altered mentation more suggestive of acute process such as GI bleeding/azotemia -Bladder scan due to history of urinary retention showed >999 -Renal ultrasound showed bilateral Hydro -Follow labs and avoid nephrotoxic medications -Treat underlying causes and placed Foley -Potentially acute systolic heart failure also contributing to acute renal dysfunction -Continue to Monitor and Repeat CMP in AM   Coagulopathy/Thrombocytopenia -Patient presented with a therapeutic INR > 10.00 -Reversal agents of vitamin K and K Centra given with pharmacy managing follow-up lab -Continue to hold Warfarin -Low platelets likely reflective of bone marrow consumption with acute evolving anemia -PT/INR improved and was 14.8/1.17; Platelet Count now 126K  Acute blood loss anemia -Baseline hemoglobin around 8.4-9.6 current hemoglobin on admit was 9.3 but do not suspect patient has equilibrated since bleeding onset only several hours prior -Agree with EDP decision to give at least 1 unit PRBCs -Order placed for RN to obtain CBC after blood infused-may require additional PRBCs in the next 24 hours -Her Hemoccult positive and agree with GI consultation (see below) -Repeat HH is 9.1/27.9 -No back pain but could be experiencing spontaneous retroperitoneal hemorrhage continue to monitor-may require additional imaging  NICM (nonischemic cardiomyopathy)/Chronic Biventricular Heart Failure  -History of biventricular heart failure [name] diagnosed May 2018 noting EF at that time  15% with improvement to 30-35% on follow-up echo -Echocardiogram August 2018: Severe concentric LVH  with an EF of 30-35% -Has significant bilateral lower extremity edema and weight is up to 242 lbss from dry weight reported at 220 lbs -Given acute kidney injury and relative hypotension all home heart failure meds have been placed on hold (Lasix, Entresto and carvedilol) -Cardiology Consulted and appreciate Evaluation -Daily weights, strict I's/O -Repeat echocardiogram this admission showed EF of 40-45% and Grade 1 Diastolic Dysfunction  -Currently maintaining sinus rhythm-previous heart failure felt to be tachycardia mediated in the context of atrial fibrillation with RVR -Cardiology evaluated and recommend holding Lasix for now; recommending possibly restarting Home Imdur  Chronic Atrial Fibrillation  -Maintaining sinus rhythm on Amiodarone- -Warfarin on hold in context of acute GI bleeding -CHA2DS2-VASc Score and unadjusted Ischemic Stroke Rate (% per year) is equal to 3.2 % stroke rate/year from a score of 3 -Cardiology Restarting Ranexa   Diverticulosis/Heme positive stool and GIB in the setting of Coagulapathy  -Patient with known diverticulosis based on prior CT the abdomen -Reports history of prior GI bleeding -Colonoscopy 12/04/17 revealed normal exam noting diverticulosis of the large intestine without perforation or abscess signs of active bleeding -Upper abdominal pain therefore bleeding could be upper GI or small bowel related -Continue Protonix 40 mg IV every Daily  -Formal GI consultation done they recommending conservative management and have no plans for endoscopy at present unless she has further active bleeding. -C/w Serial Hemoglobin -Repeat CBC in AM   Hypertension -Current blood pressure suboptimal and cardiac medications on hold as above -Continue to Monitor  DVT prophylaxis: SCDs Code Status: FULL CODE  Family Communication: No family present at bedside  Disposition Plan: Remain Inpatient for continued work up  Consultants:    Gastroenterology  Cardiology  Urology   Procedures:    Antimicrobials:  Anti-infectives (From admission, onward)   None     Subjective: Seen and examined and was upset that she did not get to eat last night. States she felt ok and a little better. No CP or SOB. Desaturated last night sleeping but refused [place] or intervention.   Objective: Vitals:   03/06/18 1530 03/06/18 1615 03/06/18 1700 03/06/18 1937  BP: (!) 152/70 (!) 150/68  (!) 158/70  Pulse: (!) 58 (!) 59    Resp: 14     Temp:    98.5 F (36.9 C)  TempSrc:    Oral  SpO2: 92% 97%  93%  Weight:   108.6 kg (239 lb 6.7 oz)   Height:   5\' 5"  (1.651 m)     Intake/Output Summary (Last 24 hours) at 03/06/2018 2051 Last data filed at 03/06/2018 1938 Gross per 24 hour  Intake 243 ml  Output 6175 ml  Net -5932 ml   Filed Weights   03/06/18 1700  Weight: 108.6 kg (239 lb 6.7 oz)   Examination: Physical Exam:  Constitutional: WN/WD obese AAF in NAD and appears calm and but uncomfortable Eyes: Lids and conjunctivae normal, sclerae anicteric  ENMT: External Ears, Nose appear normal. Grossly normal hearing. Mucous membranes are moist.   Neck: Appears normal, supple, no cervical masses, normal ROM, no appreciable thyromegaly; no JVD Respiratory: Diminished to auscultation bilaterally, no wheezing, rales, rhonchi or crackles. Normal respiratory effort and patient is not tachypenic. No accessory muscle use.  Cardiovascular: RRR, no murmurs / rubs / gallops. S1 and S2 auscultated. 2+ LE extremity edema.  Abdomen: Soft, Slightly tender, Distended. No masses palpated. No appreciable hepatosplenomegaly.  Bowel sounds positive x4.  GU: Deferred. Musculoskeletal: No clubbing / cyanosis of digits/nails. No joint deformity upper and lower extremities. Good ROM, no contractures.  Skin: Has Bruising in LE and on Right Shoulder Neurologic: CN 2-12 grossly intact with no focal deficits.  Romberg sign and cerebellar reflexes not  assessed.  Psychiatric: Normal judgment and insight. Alert and oriented x 3. Normal mood and appropriate affect.   Data Reviewed: I have personally reviewed following labs and imaging studies  CBC: Recent Labs  Lab 03/05/18 1454 03/05/18 2329 03/06/18 0214 03/06/18 0611 03/06/18 1240  WBC 6.6 6.3 6.6  --   --   NEUTROABS  --   --  4.8  --   --   HGB 9.3* 9.2* 9.6* 9.0* 9.1*  HCT 28.8* 28.5* 29.2* 28.0* 27.9*  MCV 91.4 88.8 89.8  --   --   PLT 146* 123* 126*  --   --    Basic Metabolic Panel: Recent Labs  Lab 03/04/18 1512 03/05/18 1454 03/06/18 0214 03/06/18 0610  NA 140 140  --  142  K 3.8 4.2  --  3.7  CL 108 110  --  115*  CO2 21* 19*  --  17*  GLUCOSE 105* 99  --  84  BUN 98* 120*  --  117*  CREATININE 4.30* 4.43*  --  4.28*  CALCIUM 8.8* 8.7*  --  8.6*  MG  --   --  2.0  --   PHOS  --   --  3.8  --    GFR: Estimated Creatinine Clearance: 16.3 mL/min (A) (by C-G formula based on SCr of 4.28 mg/dL (H)). Liver Function Tests: Recent Labs  Lab 03/05/18 1454 03/06/18 0610  AST 12* 15  ALT 12* 13*  ALKPHOS 34* 33*  BILITOT 0.5 1.2  PROT 6.2* 5.6*  ALBUMIN 3.3* 3.1*   No results for input(s): LIPASE, AMYLASE in the last 168 hours. No results for input(s): AMMONIA in the last 168 hours. Coagulation Profile: Recent Labs  Lab 03/05/18 1454 03/05/18 1817 03/05/18 2329 03/06/18 0610 03/06/18 1240  INR >10.00* 1.54 1.34 1.26 1.17   Cardiac Enzymes: No results for input(s): CKTOTAL, CKMB, CKMBINDEX, TROPONINI in the last 168 hours. BNP (last 3 results) No results for input(s): PROBNP in the last 8760 hours. HbA1C: No results for input(s): HGBA1C in the last 72 hours. CBG: No results for input(s): GLUCAP in the last 168 hours. Lipid Profile: No results for input(s): CHOL, HDL, LDLCALC, TRIG, CHOLHDL, LDLDIRECT in the last 72 hours. Thyroid Function Tests: No results for input(s): TSH, T4TOTAL, FREET4, T3FREE, THYROIDAB in the last 72 hours. Anemia  Panel: No results for input(s): VITAMINB12, FOLATE, FERRITIN, TIBC, IRON, RETICCTPCT in the last 72 hours. Sepsis Labs: No results for input(s): PROCALCITON, LATICACIDVEN in the last 168 hours.  Recent Results (from the past 240 hour(s))  MRSA PCR Screening     Status: None   Collection Time: 03/06/18  4:08 PM  Result Value Ref Range Status   MRSA by PCR NEGATIVE NEGATIVE Final    Comment:        The GeneXpert MRSA Assay (FDA approved for NASAL specimens only), is one component of a comprehensive MRSA colonization surveillance program. It is not intended to diagnose MRSA infection nor to guide or monitor treatment for MRSA infections. Performed at [facility], [place] [address], [place], [place] [address]     Radiology Studies: US Renal  Result Date: 03/05/2018 CLINICAL DATA:  Stage  3 chronic renal disease with acute renal deterioration EXAM: RENAL / URINARY TRACT ULTRASOUND COMPLETE COMPARISON:  CT abdomen and pelvis December 02, 2017 FINDINGS: Right Kidney: Length: 11.3 cm. Echogenicity and renal cortical thickness are within normal limits. No perinephric fluid visualized. There is moderately severe hydronephrosis. There is a cyst arising from the lower pole of the right kidney measuring 2.0 x 1.4 x 1.8 cm. No sonographically demonstrable calculus or ureterectasis. Left Kidney: Length: 11.0 cm. Echogenicity and renal cortical thickness are within normal limits. No perinephric fluid visualized. There is moderate hydronephrosis on the left. There is a cyst arising from the mid left kidney measuring 4.1 x 3.3 x 3.1 cm. No sonographically demonstrable calculus or ureterectasis. Bladder: Appears normal for degree of bladder distention. Urinary bladder is distended. Patient was unable to void. IMPRESSION: Bilateral hydronephrosis, slightly more severe on the right than on the left. Distended urinary bladder. Patient was unable to void. Question bladder outlet obstruction with hydronephrosis  secondary to stasis phenomenon. There are renal cysts bilaterally. Renal cortical thickness and echogenicity within normal limits bilaterally. Electronically Signed   By: Lowella Grip III M.D.   On: 03/05/2018 21:35   Dg Chest Port 1 View  Result Date: 03/05/2018 CLINICAL DATA:  Blood in stool EXAM: PORTABLE CHEST 1 VIEW COMPARISON:  12/29/2017 FINDINGS: Cardiomegaly with mild vascular congestion. Airspace disease at the left lung base. Aortic atherosclerosis. No pneumothorax. IMPRESSION: 1. Cardiomegaly with mild vascular congestion 2. Airspace disease at the left base, atelectasis versus pneumonia Electronically Signed   By: Donavan Foil M.D.   On: 03/05/2018 18:52   Scheduled Meds: . amiodarone  200 mg Oral Daily  . [START ON 03/07/2018] pantoprazole (PROTONIX) IV  40 mg Intravenous Q24H  . sodium chloride flush  3 mL Intravenous Q12H   Continuous Infusions:   LOS: 1 day   Kerney Elbe, DO [facility] Pager [phone]  If 7PM-7AM, please contact night-coverage [email or web address] Password [facility] 03/06/2018, 8:51 PM

## 2018-03-06 NOTE — ED Notes (Addendum)
This pts oxygen saturations decrease while she is sleeping. Pt refuses Nasal cannula. "I don't want no more wires, my oxygen is fine, I will be ok"

## 2018-03-06 NOTE — Consult Note (Addendum)
Consultation: Urinary retention, bilateral hydronephrosis Requested by: Dr. William Dalton  History of present illness: 67 year old African-American female with history of urinary retention who is followed by Dr. Pilar Jarvis and was instituted on CIC last month, March 2019.  She has not been doing CIC and was admitted to hospital after acute kidney injury was noted with a BUN of 120 and creatinine of 4.3 on outpatient labs.  She otherwise feels well.  A renal ultrasound was done which showed bilateral hydro-nephrosis and a distended bladder with about 1877 mL.  Foley catheter was placed this morning and she has good UOP.   She had her first episode of retention January 2019 when her BUN was 164 and creatinine up to 13.  Ultrasound revealed no hydro-and the bladder was decompressed with a Foley.  CT scan was also obtained which showed a thick-walled bladder, decompressed with a Foley, no hydronephrosis or stone.  She followed up in the office and started CIC.  She was admitted again February 2019 with UTI and retention. She was seen in the office by Dr. Pilar Jarvis.  He performed cystoscopy which was normal and urodynamics revealed a large capacity possibly hypotonic bladder with poor emptying.  She started on CIC again. NG risk includes prior stroke and multiple other comorbidities.     Past Medical History:  Diagnosis Date  . Arthritis   . Atrial fibrillation ([place])    a. s/p multiple cardioversions; failed tikosyn/sotalol.  Marland Kitchen CAD in native artery, s/p cardiac cath with non obstructive CAD 10/24/2016  . CHF (congestive heart failure) ([place])   . CKD (chronic kidney disease) stage 3, GFR 30-59 ml/min ([facility])   . DIVERTICULITIS, HX OF 07/25/2007  . DIVERTICULOSIS, COLON 07/22/2007  . Dyspnea   . Edema, peripheral    a. chronic BLE edema, R>L. Prior trauma from dog attack and accident.  Marland Kitchen HYPERLIPIDEMIA 02/03/2008  . Hypersomnia    declines w/u  . Hypertension   . MENOPAUSAL DISORDER 01/09/2011  . Morbid obesity  ([place]) 07/22/2007  . NICM (nonischemic cardiomyopathy) ([place]) 10/24/2016  . Raynaud's syndrome 07/22/2007  . Stroke ([place]) 2017  . THYROID NODULE, RIGHT 01/04/2010  . VITAMIN D DEFICIENCY 01/09/2011   Qualifier: Diagnosis of  By: [name], [name]    Past Surgical History:  Procedure Laterality Date  . CARDIAC CATHETERIZATION N/A 10/23/2016   Procedure: Left Heart Cath and Coronary Angiography;  Surgeon: [name], MD;  Location: [facility];  Service: Cardiovascular;  Laterality: N/A;  . CARDIOVERSION N/A 10/31/2016   Procedure: CARDIOVERSION;  Surgeon: [name], MD;  Location: [facility];  Service: Cardiovascular;  Laterality: N/A;  . CARDIOVERSION N/A 11/03/2016   Procedure: CARDIOVERSION;  Surgeon: [name], MD;  Location: [place];  Service: Cardiovascular;  Laterality: N/A;  . CARDIOVERSION N/A 11/18/2016   Procedure: CARDIOVERSION;  Surgeon: [name], MD;  Location: [place];  Service: Cardiovascular;  Laterality: N/A;  . CARDIOVERSION N/A 03/25/2017   Procedure: CARDIOVERSION;  Surgeon: [name], MD;  Location: [facility] ENDOSCOPY;  Service: Cardiovascular;  Laterality: N/A;  . CARDIOVERSION N/A 05/06/2017   Procedure: CARDIOVERSION;  Surgeon: [name], MD;  Location: [facility] ENDOSCOPY;  Service: Cardiovascular;  Laterality: N/A;  . CARDIOVERSION N/A 05/12/2017   Procedure: CARDIOVERSION;  Surgeon: [name], MD;  Location: [name] ENDOSCOPY;  Service: Cardiovascular;  Laterality: N/A;  . COLONOSCOPY N/A 12/04/2017   Procedure: COLONOSCOPY;  Surgeon: [name], MD;  Location: [place];  Service: Endoscopy;  Laterality: N/A;  . COLONOSCOPY W/ POLYPECTOMY  02/2011   pan diverticulosis.  tubular adenoma without dysplasia on 5 mm sigmoid polyp.  Dr Fuller Plan.    Marland Kitchen PARTIAL HYSTERECTOMY     1 OVARY LEFT  . TEE WITHOUT CARDIOVERSION N/A 10/31/2016   Procedure: TRANSESOPHAGEAL ECHOCARDIOGRAM (TEE);  Surgeon: [name], MD;  Location: [facility]  ENDOSCOPY;  Service: Cardiovascular;  Laterality: N/A;  . TEE WITHOUT CARDIOVERSION N/A 03/25/2017   Procedure: TRANSESOPHAGEAL ECHOCARDIOGRAM (TEE);  Surgeon: [name], MD;  Location: [facility] ENDOSCOPY;  Service: Cardiovascular;  Laterality: N/A;    Home Medications:  Medications Prior to Admission  Medication Sig Dispense Refill Last Dose  . acetaminophen (TYLENOL) 325 MG tablet Take 650 mg by mouth every 6 (six) hours as needed (for pain).    unknown at prn  . amiodarone (PACERONE) 200 MG tablet Take 1 tablet (200 mg total) TWICE a day for 2 weeks, then take 1 tablet (200 mg total) ONCE daily. 90 tablet 3 03/05/2018 at Unknown time  . carvedilol (COREG) 3.125 MG tablet Take 1 tablet (3.125 mg total) by mouth 2 (two) times daily. 180 tablet 3 03/05/2018 at 1100  . HYDROcodone-acetaminophen (NORCO/VICODIN) 5-325 MG tablet Take 1 tablet by mouth every 6 (six) hours as needed for pain. for pain  0 03/03/2018 at prn  . Investigational - Study Medication Take 1 tablet by mouth 2 (two) times daily. Study name: [facility] HF Study  Additional study details: Omecamtiv Mecarbil or Placebo 1 each PRN 03/04/2018 at Unknown time  . isosorbide-hydrALAZINE (BIDIL) 20-37.5 MG tablet Take 1 tablet by mouth 3 (three) times daily. 180 tablet 11 03/05/2018 at Unknown time  . ranolazine (RANEXA) 500 MG 12 hr tablet Take 1 tablet (500 mg total) by mouth 2 (two) times daily. 60 tablet 5 03/05/2018 at Unknown time  . spironolactone (ALDACTONE) 25 MG tablet Take 25 mg by mouth daily.  2 03/05/2018 at Unknown time  . warfarin (COUMADIN) 3 MG tablet Take 1 tablet (3 mg total) by mouth daily. (Patient taking differently: Take 3 mg by mouth one time only at 6 PM. ) 90 tablet 0 03/04/2018 at Unknown time   Allergies:  Allergies  Allergen Reactions  . Ace Inhibitors Palpitations    Family History  Problem Relation Age of Onset  . Asthma Mother   . Diabetes Father   . Heart disease Father        Died of presumed heart  attack - 75s  . Lung disease Sister   . Heart disease Sister        Twin sister has heart issue, unclear what kind  . Thyroid disease Neg Hx    Social History:  reports that she quit smoking about 16 months ago. Her smoking use included cigarettes. She smoked 0.50 packs per day. She has never used smokeless tobacco. She reports that she does not drink alcohol or use drugs.  ROS: A complete review of systems was performed.  All systems are negative except for pertinent findings as noted. Review of Systems  All other systems reviewed and are negative.    Physical Exam:  Vital signs in last 24 hours: Temp:  [98.5 F (36.9 C)] 98.5 F (36.9 C) (04/13 1937) Pulse Rate:  [52-72] 59 (04/13 1615) Resp:  [12-26] 14 (04/13 1530) BP: (118-176)/(51-108) 158/70 (04/13 1937) SpO2:  [60 %-97 %] 93 % (04/13 1937) Weight:  [108.6 kg (239 lb 6.7 oz)] 108.6 kg (239 lb 6.7 oz) (04/13 1700)  Intake/Output Summary (Last 24 hours) at 03/06/2018 2141 Last data filed at 03/06/2018 1938 Gross per 24 hour  Intake 243 ml  Output 6175 ml  Net -5932 ml     General:  Alert and oriented, No acute distress, watching TV HEENT: Normocephalic, atraumatic Cardiovascular: Regular rate and rhythm Lungs: Regular rate and effort Abdomen: Soft, nontender, nondistended, no abdominal masses Back: No CVA tenderness Extremities: No edema Neurologic: Grossly intact GU: Urine clear   Laboratory Data:  Results for orders placed or performed during the hospital encounter of 03/05/18 (from the past 24 hour(s))  Protime-INR     Status: Abnormal   Collection Time: 03/05/18 11:29 PM  Result Value Ref Range   Prothrombin Time 16.5 (H) 11.4 - 15.2 seconds   INR 1.34   CBC     Status: Abnormal   Collection Time: 03/05/18 11:29 PM  Result Value Ref Range   WBC 6.3 4.0 - 10.5 K/uL   RBC 3.21 (L) 3.87 - 5.11 MIL/uL   Hemoglobin 9.2 (L) 12.0 - 15.0 g/dL   HCT 28.5 (L) 36.0 - 46.0 %   MCV 88.8 78.0 - 100.0 fL   MCH  28.7 26.0 - 34.0 pg   MCHC 32.3 30.0 - 36.0 g/dL   RDW 16.4 (H) 11.5 - 15.5 %   Platelets 123 (L) 150 - 400 K/uL  Urinalysis, Routine w reflex microscopic     Status: Abnormal   Collection Time: 03/06/18 12:29 AM  Result Value Ref Range   Color, Urine YELLOW YELLOW   APPearance HAZY (A) CLEAR   Specific Gravity, Urine 1.012 1.005 - 1.030   pH 8.0 5.0 - 8.0   Glucose, UA NEGATIVE NEGATIVE mg/dL   Hgb urine dipstick NEGATIVE NEGATIVE   Bilirubin Urine NEGATIVE NEGATIVE   Ketones, ur NEGATIVE NEGATIVE mg/dL   Protein, ur NEGATIVE NEGATIVE mg/dL   Nitrite POSITIVE (A) NEGATIVE   Leukocytes, UA SMALL (A) NEGATIVE   RBC / HPF 0-5 0 - 5 RBC/hpf   WBC, UA 0-5 0 - 5 WBC/hpf   Bacteria, UA RARE (A) NONE SEEN   Squamous Epithelial / LPF 0-5 (A) NONE SEEN  CBC with Differential/Platelet     Status: Abnormal   Collection Time: 03/06/18  2:14 AM  Result Value Ref Range   WBC 6.6 4.0 - 10.5 K/uL   RBC 3.25 (L) 3.87 - 5.11 MIL/uL   Hemoglobin 9.6 (L) 12.0 - 15.0 g/dL   HCT 29.2 (L) 36.0 - 46.0 %   MCV 89.8 78.0 - 100.0 fL   MCH 29.5 26.0 - 34.0 pg   MCHC 32.9 30.0 - 36.0 g/dL   RDW 16.8 (H) 11.5 - 15.5 %   Platelets 126 (L) 150 - 400 K/uL   Neutrophils Relative % 72 %   Neutro Abs 4.8 1.7 - 7.7 K/uL   Lymphocytes Relative 20 %   Lymphs Abs 1.3 0.7 - 4.0 K/uL   Monocytes Relative 6 %   Monocytes Absolute 0.4 0.1 - 1.0 K/uL   Eosinophils Relative 2 %   Eosinophils Absolute 0.1 0.0 - 0.7 K/uL   Basophils Relative 0 %   Basophils Absolute 0.0 0.0 - 0.1 K/uL  Magnesium     Status: None   Collection Time: 03/06/18  2:14 AM  Result Value Ref Range   Magnesium 2.0 1.7 - 2.4 mg/dL  Phosphorus     Status: None   Collection Time: 03/06/18  2:14 AM  Result Value Ref Range   Phosphorus 3.8 2.5 -  4.6 mg/dL  Protime-INR     Status: Abnormal   Collection Time: 03/06/18  6:10 AM  Result Value Ref Range   Prothrombin Time 15.7 (H) 11.4 - 15.2 seconds   INR 1.26   Comprehensive metabolic panel      Status: Abnormal   Collection Time: 03/06/18  6:10 AM  Result Value Ref Range   Sodium 142 135 - 145 mmol/L   Potassium 3.7 3.5 - 5.1 mmol/L   Chloride 115 (H) 101 - 111 mmol/L   CO2 17 (L) 22 - 32 mmol/L   Glucose, Bld 84 65 - 99 mg/dL   BUN 117 (H) 6 - 20 mg/dL   Creatinine, Ser 4.28 (H) 0.44 - 1.00 mg/dL   Calcium 8.6 (L) 8.9 - 10.3 mg/dL   Total Protein 5.6 (L) 6.5 - 8.1 g/dL   Albumin 3.1 (L) 3.5 - 5.0 g/dL   AST 15 15 - 41 U/L   ALT 13 (L) 14 - 54 U/L   Alkaline Phosphatase 33 (L) 38 - 126 U/L   Total Bilirubin 1.2 0.3 - 1.2 mg/dL   GFR calc non Af Amer 10 (L) >60 mL/min   GFR calc Af Amer 12 (L) >60 mL/min   Anion gap 10 5 - 15  APTT     Status: None   Collection Time: 03/06/18  6:10 AM  Result Value Ref Range   aPTT 25 24 - 36 seconds  Hemoglobin and hematocrit, blood     Status: Abnormal   Collection Time: 03/06/18  6:11 AM  Result Value Ref Range   Hemoglobin 9.0 (L) 12.0 - 15.0 g/dL   HCT 28.0 (L) 36.0 - 46.0 %  Protime-INR     Status: None   Collection Time: 03/06/18 12:40 PM  Result Value Ref Range   Prothrombin Time 14.8 11.4 - 15.2 seconds   INR 1.17   Hemoglobin and hematocrit, blood     Status: Abnormal   Collection Time: 03/06/18 12:40 PM  Result Value Ref Range   Hemoglobin 9.1 (L) 12.0 - 15.0 g/dL   HCT 27.9 (L) 36.0 - 46.0 %  BLOOD TRANSFUSION REPORT - SCANNED     Status: None   Collection Time: 03/06/18 12:46 PM   Narrative   Ordered by an unspecified provider.  MRSA PCR Screening     Status: None   Collection Time: 03/06/18  4:08 PM  Result Value Ref Range   MRSA by PCR NEGATIVE NEGATIVE   Recent Results (from the past 240 hour(s))  MRSA PCR Screening     Status: None   Collection Time: 03/06/18  4:08 PM  Result Value Ref Range Status   MRSA by PCR NEGATIVE NEGATIVE Final    Comment:        The GeneXpert MRSA Assay (FDA approved for NASAL specimens only), is one component of a comprehensive MRSA colonization surveillance program. It  is not intended to diagnose MRSA infection nor to guide or monitor treatment for MRSA infections. Performed at [facility], [place] [address] [place], [place] [address]    Creatinine: Recent Labs    03/04/18 1512 03/05/18 1454 03/06/18 0610  CREATININE 4.30* 4.43* 4.28*   I reviewed the CT scan images, I reviewed images from both ultrasounds   Impression/Assessment/Plan:   1) Bilateral hydronephrosis-likely from distended bladder.  Recheck renal ultrasound in a day or 2.   2) Acute kidney injury-likely related to urinary retention.  She is diuresing appropriately.  Watch for postobstructive diuresis and  electrolyte abnormalities.  3) Urinary retention-continue Foley-Follow-up with Dr. Pilar Jarvis  for CIC retraining versus Foley versus SP tube   Festus Aloe 03/06/2018, 9:28 PM

## 2018-03-06 NOTE — Consult Note (Signed)
Cardiology Consultation:   Patient ID: Patricia Wagner; [id number]; 1951-06-21   Admit date: 03/05/2018 Date of Consult: 03/06/2018  Primary Care Provider: Biagio Borg, MD Primary Cardiologist: Dr. [name] / [name] Primary Electrophysiologist:  Dr. Curt Bears   Patient Profile:   Patricia Wagner is a 67 y.o. female with a hx of  persistent atrial fibrillation, NICM, biventricular heart failure, hypertension and hyperlipidemia who is being seen today for the evaluation of acute HF, AKI in the setting of obstructive uropathy at the request of Dr. Cathlean Cower.  History of Present Illness:   Patricia Wagner is a 67 year old African-American female with past medical history of persistent atrial fibrillation, NICM, biventricular heart failure, hypertension and hyperlipidemia.  Previous cardiac catheterization on 10/23/2016 showed mild nonobstructive CAD consistent with nonischemic cardiomyopathy. She had multiple DC cardioversion in 2017 and 2018. TEE in May 2018 demonstrated EF 15% with severe RV systolic dysfunction as well.  She was admitted in June 2018 with cardiogenic shock in the setting of persistent atrial fibrillation.  EF was 15% of the time.  She was loaded on amiodarone and required dobutamine for low output heart failure.  She underwent DC cardioversion in June 2018 however went back into A. fib 2 days later and had recurrent cardiogenic shock.  She eventually underwent successful cardioversion on 05/12/2017 with conversion to normal sinus rhythm.  She was placed on Ranexa for the off label use of arrhythmia control.  Echocardiogram in May 2018 showed EF of 30-35%, severe LVH, mild to moderate MR, biatrial enlargement, moderately reduced RV function.  She had negative fat pad biopsy.  Myoview in October 2018 was equivocal but not favored for amyloid.  It was originally planned for the patient to undergo atrial fibrillation ablation in January 2019, although she was admitted in February with UTI and  AKI.  Her Lasix, spironolactone and Entresto were stopped.  She was discharged on Foley and was treated with IV antibiotic.  Her creatinine improved to 1.07 afterward.  Due to 15 pound weight gain, she was placed back on Lasix on 03/01/2018.  The plan is to proceed with atrial fibrillation ablation once she is closer to her dry weight.  She was seen by [name] on 03/04/2018 and she actually lost about 5 pounds after starting on the Lasix.  However lab work obtained during the heart failure clinic visit shows creatinine went up to 4.  She was subsequently instructed to hold Lasix and further evaluation in the hospital.  On arrival, she was also noted to have anemia with hemoglobin of 9.  She had positive guaiac stool study.  Initial INR on arrival was very high at greater than 10, this was reversed with K Centra, vitamin K and transfused 1 unit.  GI has been consulted.  Cardiology has been consulted for volume overload.    Past Medical History:  Diagnosis Date  . Arthritis   . Atrial fibrillation ([place])    a. s/p multiple cardioversions; failed tikosyn/sotalol.  Marland Kitchen CAD in native artery, s/p cardiac cath with non obstructive CAD 10/24/2016  . CHF (congestive heart failure) ([place])   . CKD (chronic kidney disease) stage 3, GFR 30-59 ml/min ([facility])   . DIVERTICULITIS, HX OF 07/25/2007  . DIVERTICULOSIS, COLON 07/22/2007  . Dyspnea   . Edema, peripheral    a. chronic BLE edema, R>L. Prior trauma from dog attack and accident.  Marland Kitchen HYPERLIPIDEMIA 02/03/2008  . Hypersomnia    declines w/u  . Hypertension   . MENOPAUSAL  DISORDER 01/09/2011  . Morbid obesity ([place]) 07/22/2007  . NICM (nonischemic cardiomyopathy) ([place]) 10/24/2016  . Raynaud's syndrome 07/22/2007  . Stroke ([place]) 2017  . THYROID NODULE, RIGHT 01/04/2010  . VITAMIN D DEFICIENCY 01/09/2011   Qualifier: Diagnosis of  By: [name], [name]     Past Surgical History:  Procedure Laterality Date  . CARDIAC CATHETERIZATION N/A 10/23/2016   Procedure:  Left Heart Cath and Coronary Angiography;  Surgeon: [name], MD;  Location: [facility];  Service: Cardiovascular;  Laterality: N/A;  . CARDIOVERSION N/A 10/31/2016   Procedure: CARDIOVERSION;  Surgeon: [name], MD;  Location: [facility];  Service: Cardiovascular;  Laterality: N/A;  . CARDIOVERSION N/A 11/03/2016   Procedure: CARDIOVERSION;  Surgeon: [name], MD;  Location: [facility];  Service: Cardiovascular;  Laterality: N/A;  . CARDIOVERSION N/A 11/18/2016   Procedure: CARDIOVERSION;  Surgeon: [name], MD;  Location: [facility];  Service: Cardiovascular;  Laterality: N/A;  . CARDIOVERSION N/A 03/25/2017   Procedure: CARDIOVERSION;  Surgeon: [name], MD;  Location: [facility] ENDOSCOPY;  Service: Cardiovascular;  Laterality: N/A;  . CARDIOVERSION N/A 05/06/2017   Procedure: CARDIOVERSION;  Surgeon: [name], MD;  Location: [facility] ENDOSCOPY;  Service: Cardiovascular;  Laterality: N/A;  . CARDIOVERSION N/A 05/12/2017   Procedure: CARDIOVERSION;  Surgeon: [name], MD;  Location: [facility] ENDOSCOPY;  Service: Cardiovascular;  Laterality: N/A;  . COLONOSCOPY N/A 12/04/2017   Procedure: COLONOSCOPY;  Surgeon: [name], MD;  Location: [facility];  Service: Endoscopy;  Laterality: N/A;  . COLONOSCOPY W/ POLYPECTOMY  02/2011   pan diverticulosis.  tubular adenoma without dysplasia on 5 mm sigmoid polyp.  Dr Fuller Plan.    Marland Kitchen PARTIAL HYSTERECTOMY     1 OVARY LEFT  . TEE WITHOUT CARDIOVERSION N/A 10/31/2016   Procedure: TRANSESOPHAGEAL ECHOCARDIOGRAM (TEE);  Surgeon: [name], MD;  Location: [facility] ENDOSCOPY;  Service: Cardiovascular;  Laterality: N/A;  . TEE WITHOUT CARDIOVERSION N/A 03/25/2017   Procedure: TRANSESOPHAGEAL ECHOCARDIOGRAM (TEE);  Surgeon: [name], MD;  Location[facility] END[facility]COPY;  Service: Cardiovascular;  Laterality: N/A;     Home Medications:  Prior to Admission medications   Medication Sig Start Date End Date Taking?  Authorizing Provider  acetaminophen (TYLENOL) 325 MG tablet Take 650 mg by mouth every 6 (six) hours as needed (for pain).    Yes [provider]  amiodarone (PACERONE) 200 MG tablet Take 1 tablet (200 mg total) TWICE a day for 2 weeks, then take 1 tablet (200 mg total) ONCE daily. 03/01/18  Yes Camnitz, Will Hassell Done, MD  carvedilol (COREG) 3.125 MG tablet Take 1 tablet (3.125 mg total) by mouth 2 (two) times daily. 07/09/17 03/05/18 Yes [name], MD  HYDROcodone-acetaminophen (NORCO/VICODIN) 5-325 MG tablet Take 1 tablet by mouth every 6 (six) hours as needed for pain. for pain 02/11/18  Yes [provider]  Investigational - Study Medication Take 1 tablet by mouth 2 (two) times daily. Study name: Galactic HF Study  Additional study details: Omecamtiv Mecarbil or Placebo 09/17/17  Yes Larey Dresser, MD  isosorbide-hydrALAZINE (BIDIL) 20-37.5 MG tablet Take 1 tablet by mouth 3 (three) times daily. 10/20/17  Yes [name], MD  ranolazine (RANEXA) 500 MG 12 hr tablet Take 1 tablet (500 mg total) by mouth 2 (two) times daily. 09/14/17  Yes Bensimhon, Shaune Pascal, MD  spironolactone (ALDACTONE) 25 MG tablet Take 25 mg by mouth daily. 03/04/18  Yes [provider]  warfarin (  COUMADIN) 3 MG tablet Take 1 tablet (3 mg total) by mouth daily. Patient taking differently: Take 3 mg by mouth one time only at 6 PM.  03/01/18  Yes [name], Denice Bors, MD    Inpatient Medications: Scheduled Meds: . amiodarone  200 mg Oral Daily  . [START ON 03/07/2018] pantoprazole (PROTONIX) IV  40 mg Intravenous Q24H  . sodium chloride flush  3 mL Intravenous Q12H   Continuous Infusions:  PRN Meds: acetaminophen **OR** acetaminophen, fentaNYL (SUBLIMAZE) injection, ondansetron **OR** ondansetron (ZOFRAN) IV  Allergies:    Allergies  Allergen Reactions  . Ace Inhibitors Palpitations    Social History:   Social History   Socioeconomic History  . Marital status: Single    Spouse  name: Not on file  . Number of children: 1  . Years of education: Not on file  . Highest education level: Not on file  Occupational History  . Occupation: Engineer, manufacturing  . Financial resource strain: Not on file  . Food insecurity:    Worry: Not on file    Inability: Not on file  . Transportation needs:    Medical: Not on file    Non-medical: Not on file  Tobacco Use  . Smoking status: Former Smoker    Packs/day: 0.50    Types: Cigarettes    Last attempt to quit: 10/23/2016    Years since quitting: 1.3  . Smokeless tobacco: Never Used  . Tobacco comment: Smoked for 50 years  Substance and Sexual Activity  . Alcohol use: No    Alcohol/week: 2.4 oz    Types: 4 Cans of beer per week    Frequency: Never    Comment: Intermittent  . Drug use: No  . Sexual activity: Not on file  Lifestyle  . Physical activity:    Days per week: Not on file    Minutes per session: Not on file  . Stress: Not on file  Relationships  . Social connections:    Talks on phone: Not on file    Gets together: Not on file    Attends religious service: Not on file    Active member of club or organization: Not on file    Attends meetings of clubs or organizations: Not on file    Relationship status: Not on file  . Intimate partner violence:    Fear of current or ex partner: Not on file    Emotionally abused: Not on file    Physically abused: Not on file    Forced sexual activity: Not on file  Other Topics Concern  . Not on file  Social History Narrative  . Not on file    Family History:    Family History  Problem Relation Age of Onset  . Asthma Mother   . Diabetes Father   . Heart disease Father        Died of presumed heart attack - 82s  . Lung disease Sister   . Heart disease Sister        Twin sister has heart issue, unclear what kind  . Thyroid disease Neg Hx      ROS:  Please see the history of present illness.   All other ROS reviewed and negative.     Physical  Exam/Data:   Vitals:   03/06/18 0745 03/06/18 0830 03/06/18 0900 03/06/18 0930  BP: (!) 150/73 (!) 118/51 123/62 (!) 146/57  Pulse: 60 68 65 64  Resp: 15 16 19 16   Temp:  TempSrc:      SpO2: 90% (!) 87% 91% 92%    Intake/Output Summary (Last 24 hours) at 03/06/2018 1036 Last data filed at 03/06/2018 0942 Gross per 24 hour  Intake 548 ml  Output 3350 ml  Net -2802 ml   There were no vitals filed for this visit. There is no height or weight on file to calculate BMI.  General:  Well nourished, well developed, in no acute distress HEENT: normal Lymph: no adenopathy Neck: no JVD Endocrine:  No thryomegaly Vascular: No carotid bruits; FA pulses 2+ bilaterally without bruits  Cardiac:  normal S1, S2; RRR; no murmur  Lungs:  clear to auscultation bilaterally, no wheezing, rhonchi or rales  Abd: soft, nontender, no hepatomegaly  Ext: 3+ pitting edema Musculoskeletal:  No deformities, BUE and BLE strength normal and equal Skin: warm and dry  Neuro:  CNs 2-12 intact, no focal abnormalities noted Psych:  Normal affect   EKG:  The EKG was personally reviewed and demonstrates:  NSR, no ischemia Telemetry:  Telemetry was personally reviewed and demonstrates:  NSR with PVCs  Relevant CV Studies:  Cath 10/23/2016 Conclusion   Conclusions: 1. Mild, non-obstructive coronary artery disease consistent with non-ischemic cardiomyopathy. 2. Mildly elevated left ventricular filling pressure.  Recommendations: 1. Continue medical treatment of non-ischemic cardiomyopathy and atrial fibrillation with rapid ventricular response. 2. Primary prevention to prevent progression of mild CAD. 3. Restart apixaban at 2200 tonight if right radial arteriotomy site is benign with evidence of bleeding.      Echo 07/15/2017 LV EF: 30% -   35%  Study Conclusions  - Left ventricle: The cavity size was normal. There was severe   concentric hypertrophy. Systolic function was moderately to    severely reduced. The estimated ejection fraction was in the   range of 30% to 35%. Diffuse hypokinesis. - Mitral valve: There was mild to moderate regurgitation. - Left atrium: The atrium was severely dilated. - Right ventricle: The cavity size was mildly dilated. Wall   thickness was normal. Systolic function was moderately reduced. - Right atrium: The atrium was moderately dilated. - Pericardium, extracardiac: A trivial pericardial effusion was   identified. Features were not consistent with tamponade   physiology.  Impressions:  - When compared to the prior study from 03/25/2017, LVEF is mildly   improved from 15% to 30-30%, RVEF remains moderately decreased.   There is severe concentric LVH, moderate to severe bi-atrial   dilatation and valves thickening. Consider further workup for   cardiac amyloidosis.   Laboratory Data:  Chemistry Recent Labs  Lab 03/04/18 1512 03/05/18 1454 03/06/18 0610  NA 140 140 142  K 3.8 4.2 3.7  CL 108 110 115*  CO2 21* 19* 17*  GLUCOSE 105* 99 84  BUN 98* 120* 117*  CREATININE 4.30* 4.43* 4.28*  CALCIUM 8.8* 8.7* 8.6*  GFRNONAA 10* 10* 10*  GFRAA 12* 11* 12*  ANIONGAP 11 11 10     Recent Labs  Lab 03/05/18 1454 03/06/18 0610  PROT 6.2* 5.6*  ALBUMIN 3.3* 3.1*  AST 12* 15  ALT 12* 13*  ALKPHOS 34* 33*  BILITOT 0.5 1.2   Hematology Recent Labs  Lab 03/05/18 1454 03/05/18 2329 03/06/18 0214 03/06/18 0611  WBC 6.6 6.3 6.6  --   RBC 3.15* 3.21* 3.25*  --   HGB 9.3* 9.2* 9.6* 9.0*  HCT 28.8* 28.5* 29.2* 28.0*  MCV 91.4 88.8 89.8  --   MCH 29.5 28.7 29.5  --   MCHC 32.3  32.3 32.9  --   RDW 15.5 16.4* 16.8*  --   PLT 146* 123* 126*  --    Cardiac EnzymesNo results for input(s): TROPONINI in the last 168 hours. No results for input(s): TROPIPOC in the last 168 hours.  BNPNo results for input(s): BNP, PROBNP in the last 168 hours.  DDimer No results for input(s): DDIMER in the last 168 hours.  Radiology/Studies:  US  Renal  Result Date: 03/05/2018 CLINICAL DATA:  Stage 3 chronic renal disease with acute renal deterioration EXAM: RENAL / URINARY TRACT ULTRASOUND COMPLETE COMPARISON:  CT abdomen and pelvis December 02, 2017 FINDINGS: Right Kidney: Length: 11.3 cm. Echogenicity and renal cortical thickness are within normal limits. No perinephric fluid visualized. There is moderately severe hydronephrosis. There is a cyst arising from the lower pole of the right kidney measuring 2.0 x 1.4 x 1.8 cm. No sonographically demonstrable calculus or ureterectasis. Left Kidney: Length: 11.0 cm. Echogenicity and renal cortical thickness are within normal limits. No perinephric fluid visualized. There is moderate hydronephrosis on the left. There is a cyst arising from the mid left kidney measuring 4.1 x 3.3 x 3.1 cm. No sonographically demonstrable calculus or ureterectasis. Bladder: Appears normal for degree of bladder distention. Urinary bladder is distended. Patient was unable to void. IMPRESSION: Bilateral hydronephrosis, slightly more severe on the right than on the left. Distended urinary bladder. Patient was unable to void. Question bladder outlet obstruction with hydronephrosis secondary to stasis phenomenon. There are renal cysts bilaterally. Renal cortical thickness and echogenicity within normal limits bilaterally. Electronically Signed   By: Lowella Grip III M.D.   On: 03/05/2018 21:35   Dg Chest Port 1 View  Result Date: 03/05/2018 CLINICAL DATA:  Blood in stool EXAM: PORTABLE CHEST 1 VIEW COMPARISON:  12/29/2017 FINDINGS: Cardiomegaly with mild vascular congestion. Airspace disease at the left lung base. Aortic atherosclerosis. No pneumothorax. IMPRESSION: 1. Cardiomegaly with mild vascular congestion 2. Airspace disease at the left base, atelectasis versus pneumonia Electronically Signed   By: Donavan Foil M.D.   On: 03/05/2018 18:52    Assessment and Plan:   1. Acute biventricular failure: Occurred in the  setting of obstructive uropathy, fortunately at this point her lung is quite clear on physical exam.  Majority of the fluid seems to accumulate in her legs.  Will probably allow the creatinine to slowly drift down without IV hydration, then after creatinine improved to a certain degree, start on IV Lasix.  2. Acute renal insufficiency: Occurred in the setting of obstructive uropathy, currently on Foley catheter.  She also has positive nitrite on the urinalysis, will defer to primary care provider.  This is the third admission for renal failure due to urinary retention.  Consider urology consultation  3. GI bleed in the setting of coagulopathy: INR on arrival was greater than 10, this was reversed with K Centra, vitamin K and transfusion.  Most recent INR is less than 2.  Patient is being evaluated by GI  4. Atrial fibrillation on Coumadin: INR reversed due to coagulopathy with supratherapeutic level.  Restart Ranexa  5. Nonischemic cardiomyopathy: EF was 15% in May 2018, improved to 30% on the last echocardiogram in August 2018  6. Nonischemic cardiomyopathy: Consider restart home dose of Imdur, would avoid significant hypotension due to kidney injury.  Once kidney function further improves and the patient is diuresed, will restart carvedilol as well.  Unless there is a plan to prevent another admission for urinary retention, would avoid spironolactone.  7. Hypertension: BP stable.   8. Hyperlipidemia   For questions or updates, please contact [place] Please consult [email or web address] for contact info under Cardiology/STEMI.   Hilbert Corrigan, [place]  03/06/2018 10:36 AM

## 2018-03-06 NOTE — Progress Notes (Signed)
  Echocardiogram 2D Echocardiogram has been performed.  Patricia Wagner 03/06/2018, 12:52 PM

## 2018-03-06 NOTE — Consult Note (Addendum)
Consultation  Referring Provider: [facility] Primary Care Physician:  Biagio Borg, MD Primary Gastroenterologist:  Dr.Perry  Reason for Consultation:  Melena, anemia  HPI: Patricia Wagner is a 67 y.o. female, known to Dr. Scarlette Shorts, and recently seen during hospitalization in January 2019 for acute diverticular bleed.  She was readmitted yesterday evening through the emergency room, after being evaluated by her cardiologist Dr Missy Sabins the day before . Labs had been done showing multiple derangements -most concerning was creat over 4. She has history of morbid obesity, atrial fibrillation for which she is on Coumadin, nonischemic cardiomyopathy with EF of 30-35%, history of hypertension Raynauds, chronic kidney disease ,stage III and diverticulosis. After being seen in the emergency room she admitted to passing tarry stools x 2 earlier that morning. She also says she had been bruising all over for the past several days. She has no c/o abdominal pain,, no N/V, no heartburn, dysphagia. No recent changes in bowels  Etc. Pt had a hgbof 8.4 in Feb 2019 which was her last check, and was after a diverticular bleed  in January. Admission showed hemoglobin of 9.3 hematocrit of 28.8 platelets 146, INR was greater than 10 and pro time of 89.  She was given K Centra and vitamin K in the emergency room and transfused 1 unit of packed RBCs. Most recent hemoglobin was 9.6 hematocrit of 29.2 INR normalized at 1.2. Noted yesterday to have BUN of 120 creatinine 4.4 today BUN 117 creatinine 4.2. Colonoscopy in January 2019 showed multiple small and large mouth diverticuli throughout the colon.  She has not had EGD.  No further stool since the two yesterday am .   She is c/o pelvic pain spasm currently - She had not been able to void and had catheter placed earlier this am.    Past Medical History:  Diagnosis Date  . Arthritis   . Atrial fibrillation ([place])    a. s/p multiple cardioversions;  failed tikosyn/sotalol.  Marland Kitchen CAD in native artery, s/p cardiac cath with non obstructive CAD 10/24/2016  . CHF (congestive heart failure) ([place])   . CKD (chronic kidney disease) stage 3, GFR 30-59 ml/min (HCC)   . DIVERTICULITIS, HX OF 07/25/2007  . DIVERTICULOSIS, COLON 07/22/2007  . Dyspnea   . Edema, peripheral    a. chronic BLE edema, R>L. Prior trauma from dog attack and accident.  Marland Kitchen HYPERLIPIDEMIA 02/03/2008  . Hypersomnia    declines w/u  . Hypertension   . MENOPAUSAL DISORDER 01/09/2011  . Morbid obesity ([place]) 07/22/2007  . NICM (nonischemic cardiomyopathy) ([place]) 10/24/2016  . Raynaud's syndrome 07/22/2007  . Stroke ([place]) 2017  . THYROID NODULE, RIGHT 01/04/2010  . VITAMIN D DEFICIENCY 01/09/2011   Qualifier: Diagnosis of  By: [name], [name]     Past Surgical History:  Procedure Laterality Date  . CARDIAC CATHETERIZATION N/A 10/23/2016   Procedure: Left Heart Cath and Coronary Angiography;  Surgeon: [name], MD;  Location: [facility];  Service: Cardiovascular;  Laterality: N/A;  . CARDIOVERSION N/A 10/31/2016   Procedure: CARDIOVERSION;  Surgeon: [name], MD;  Location: [facility] ENDOSCOPY;  Service: Cardiovascular;  Laterality: N/A;  . CARDIOVERSION N/A 11/03/2016   Procedure: CARDIOVERSION;  Surgeon: [name], MD;  Location: [facility] ENDOSCOPY;  Service: Cardiovascular;  Laterality: N/A;  . CARDIOVERSION N/A 11/18/2016   Procedure: CARDIOVERSION;  Surgeon: [name], MD;  Location: [facility] ENDOSCOPY;  Service: Cardiovascular;  Laterality: N/A;  . CARDIOVERSION N/A 03/25/2017  Procedure: CARDIOVERSION;  Surgeon: [name], MD;  Location: [facility] ENDOSCOPY;  Service: Cardiovascular;  Laterality: N/A;  . CARDIOVERSION N/A 05/06/2017   Procedure: CARDIOVERSION;  Surgeon: [name], MD;  Location: [facility] ENDOSCOPY;  Service: Cardiovascular;  Laterality: N/A;  . CARDIOVERSION N/A 05/12/2017   Procedure: CARDIOVERSION;  Surgeon: [name], MD;  Location:  [facility] ENDOSCOPY;  Service: Cardiovascular;  Laterality: N/A;  . COLONOSCOPY N/A 12/04/2017   Procedure: COLONOSCOPY;  Surgeon: [name], MD;  Location: [facility] ENDOSCOPY;  Service: Endoscopy;  Laterality: N/A;  . COLONOSCOPY W/ POLYPECTOMY  02/2011   pan diverticulosis.  tubular adenoma without dysplasia on 5 mm sigmoid polyp.  Dr Fuller Plan.    Marland Kitchen PARTIAL HYSTERECTOMY     1 OVARY LEFT  . TEE WITHOUT CARDIOVERSION N/A 10/31/2016   Procedure: TRANSESOPHAGEAL ECHOCARDIOGRAM (TEE);  Surgeon: [name], MD;  Location: [facility] ENDOSCOPY;  Service: Cardiovascular;  Laterality: N/A;  . TEE WITHOUT CARDIOVERSION N/A 03/25/2017   Procedure: TRANSESOPHAGEAL ECHOCARDIOGRAM (TEE);  Surgeon: [name], MD;  Location: [facility] ENDOSCOPY;  Service: Cardiovascular;  Laterality: N/A;    Prior to Admission medications   Medication Sig Start Date End Date Taking? Authorizing Provider  acetaminophen (TYLENOL) 325 MG tablet Take 650 mg by mouth every 6 (six) hours as needed (for pain).    Yes [provider]  amiodarone (PACERONE) 200 MG tablet Take 1 tablet (200 mg total) TWICE a day for 2 weeks, then take 1 tablet (200 mg total) ONCE daily. 03/01/18  Yes Camnitz, Will Hassell Done, MD  carvedilol (COREG) 3.125 MG tablet Take 1 tablet (3.125 mg total) by mouth 2 (two) times daily. 07/09/17 03/05/18 Yes [name], MD  HYDROcodone-acetaminophen (NORCO/VICODIN) 5-325 MG tablet Take 1 tablet by mouth every 6 (six) hours as needed for pain. for pain 02/11/18  Yes [provider]  Investigational - Study Medication Take 1 tablet by mouth 2 (two) times daily. Study name: Galactic HF Study  Additional study details: Omecamtiv Mecarbil or Placebo 09/17/17  Yes Larey Dresser, MD  isosorbide-hydrALAZINE (BIDIL) 20-37.5 MG tablet Take 1 tablet by mouth 3 (three) times daily. 10/20/17  Yes [name], MD  ranolazine (RANEXA) 500 MG 12 hr tablet Take 1 tablet (500 mg total) by mouth 2 (two) times daily. 09/14/17  Yes  Bensimhon, Shaune Pascal, MD  spironolactone (ALDACTONE) 25 MG tablet Take 25 mg by mouth daily. 03/04/18  Yes [provider]  warfarin (COUMADIN) 3 MG tablet Take 1 tablet (3 mg total) by mouth daily. Patient taking differently: Take 3 mg by mouth one time only at 6 PM.  03/01/18  Yes [name], Denice Bors, MD    Current Facility-Administered Medications  Medication Dose Route Frequency Provider Last Rate Last Dose  . acetaminophen (TYLENOL) tablet 650 mg  650 mg Oral Q6H PRN Samella Parr, NP       Or  . acetaminophen (TYLENOL) suppository 650 mg  650 mg Rectal Q6H PRN Samella Parr, NP      . amiodarone (PACERONE) tablet 200 mg  200 mg Oral Daily Samella Parr, NP   200 mg at 03/06/18 0928  . ondansetron (ZOFRAN) tablet 4 mg  4 mg Oral Q6H PRN Samella Parr, NP       Or  . ondansetron [facility]) injection 4 mg  4 mg Intravenous Q6H PRN Samella Parr, NP      . pantoprazole (PROTONIX) injection 40 mg  40 mg Intravenous Q12H Lissa Merlin,  Leisa Lenz, NP   40 mg at 03/06/18 0928  . sodium chloride flush (NS) 0.9 % injection 3 mL  3 mL Intravenous Q12H Samella Parr, NP   3 mL at 03/06/18 5102   Current Outpatient Medications  Medication Sig Dispense Refill  . acetaminophen (TYLENOL) 325 MG tablet Take 650 mg by mouth every 6 (six) hours as needed (for pain).     Marland Kitchen amiodarone (PACERONE) 200 MG tablet Take 1 tablet (200 mg total) TWICE a day for 2 weeks, then take 1 tablet (200 mg total) ONCE daily. 90 tablet 3  . carvedilol (COREG) 3.125 MG tablet Take 1 tablet (3.125 mg total) by mouth 2 (two) times daily. 180 tablet 3  . HYDROcodone-acetaminophen (NORCO/VICODIN) 5-325 MG tablet Take 1 tablet by mouth every 6 (six) hours as needed for pain. for pain  0  . Investigational - Study Medication Take 1 tablet by mouth 2 (two) times daily. Study name: Galactic HF Study  Additional study details: Omecamtiv Mecarbil or Placebo 1 each PRN  . isosorbide-hydrALAZINE (BIDIL) 20-37.5 MG tablet Take  1 tablet by mouth 3 (three) times daily. 180 tablet 11  . ranolazine (RANEXA) 500 MG 12 hr tablet Take 1 tablet (500 mg total) by mouth 2 (two) times daily. 60 tablet 5  . spironolactone (ALDACTONE) 25 MG tablet Take 25 mg by mouth daily.  2  . warfarin (COUMADIN) 3 MG tablet Take 1 tablet (3 mg total) by mouth daily. (Patient taking differently: Take 3 mg by mouth one time only at 6 PM. ) 90 tablet 0    Allergies as of 03/05/2018 - Review Complete 03/05/2018  Allergen Reaction Noted  . Ace inhibitors Palpitations 03/20/2011    Family History  Problem Relation Age of Onset  . Asthma Mother   . Diabetes Father   . Heart disease Father        Died of presumed heart attack - 20s  . Lung disease Sister   . Heart disease Sister        Twin sister has heart issue, unclear what kind  . Thyroid disease Neg Hx     Social History   Socioeconomic History  . Marital status: Single    Spouse name: Not on file  . Number of children: 1  . Years of education: Not on file  . Highest education level: Not on file  Occupational History  . Occupation: Engineer, manufacturing  . Financial resource strain: Not on file  . Food insecurity:    Worry: Not on file    Inability: Not on file  . Transportation needs:    Medical: Not on file    Non-medical: Not on file  Tobacco Use  . Smoking status: Former Smoker    Packs/day: 0.50    Types: Cigarettes    Last attempt to quit: 10/23/2016    Years since quitting: 1.3  . Smokeless tobacco: Never Used  . Tobacco comment: Smoked for 50 years  Substance and Sexual Activity  . Alcohol use: No    Alcohol/week: 2.4 oz    Types: 4 Cans of beer per week    Frequency: Never    Comment: Intermittent  . Drug use: No  . Sexual activity: Not on file  Lifestyle  . Physical activity:    Days per week: Not on file    Minutes per session: Not on file  . Stress: Not on file  Relationships  . Social connections:    Talks  on phone: Not on file    Gets  together: Not on file    Attends religious service: Not on file    Active member of club or organization: Not on file    Attends meetings of clubs or organizations: Not on file    Relationship status: Not on file  . Intimate partner violence:    Fear of current or ex partner: Not on file    Emotionally abused: Not on file    Physically abused: Not on file    Forced sexual activity: Not on file  Other Topics Concern  . Not on file  Social History Narrative  . Not on file    Review of Systems: Pertinent positive and negative review of systems were noted in the above HPI section.  All other review of systems was otherwise negative.Marland Kitchen  Physical Exam: Vital signs in last 24 hours: Temp:  [98.2 F (36.8 C)-98.7 F (37.1 C)] 98.6 F (37 C) (04/12 2124) Pulse Rate:  [52-72] 64 (04/13 0930) Resp:  [12-26] 16 (04/13 0930) BP: (88-176)/(51-108) 146/57 (04/13 0930) SpO2:  [60 %-99 %] 92 % (04/13 0930)   General:   Alert,  Well-developed, well-nourished, pleasant and cooperative in NAD- uncomfortable Head:  Normocephalic and atraumatic. Eyes:  Sclera clear, no icterus.   Conjunctiva pink. Ears:  Normal auditory acuity. Nose:  No deformity, discharge,  or lesions. Mouth:  No deformity or lesions.   Neck:  Supple; no masses or thyromegaly. Lungs:  Clear throughout to auscultation.   No wheezes, crackles, or rhonchi. Heart:  Regular rate and rhythm; + systolic murmur Abdomen:  Soft,nontender, BS active,nonpalp mass or hsm.  She has tenderness directly over the bladder Rectal:  Deferred  Msk:  Symmetrical without gross deformities. . Pulses:  Normal pulses noted. Extremities:  2+ edema to shins Neurologic:  Alert and  oriented x4;  grossly normal neurologically. Skin:  Intact without significant lesions or rashes.. Psych:  Alert and cooperative. Normal mood and affect.  Intake/Output from previous day: 04/12 0701 - 04/13 0700 In: 548 [I.V.:3; Blood:315; IV Piggyback:230] Out: 1300  [Urine:1300] Intake/Output this shift: Total I/O In: -  Out: 2050 [Urine:2050]  Lab Results: Recent Labs    03/05/18 1454 03/05/18 2329 03/06/18 0214 03/06/18 0611  WBC 6.6 6.3 6.6  --   HGB 9.3* 9.2* 9.6* 9.0*  HCT 28.8* 28.5* 29.2* 28.0*  PLT 146* 123* 126*  --    BMET Recent Labs    03/04/18 1512 03/05/18 1454 03/06/18 0610  NA 140 140 142  K 3.8 4.2 3.7  CL 108 110 115*  CO2 21* 19* 17*  GLUCOSE 105* 99 84  BUN 98* 120* 117*  CREATININE 4.30* 4.43* 4.28*  CALCIUM 8.8* 8.7* 8.6*   LFT Recent Labs    03/06/18 0610  PROT 5.6*  ALBUMIN 3.1*  AST 15  ALT 13*  ALKPHOS 33*  BILITOT 1.2   PT/INR Recent Labs    03/05/18 2329 03/06/18 0610  LABPROT 16.5* 15.7*  INR 1.34 1.26      IMPRESSION:  #29 67 year old African-American female with multiple medical problems who was admitted yesterday after outpatient labs showed acute kidney injury with creatinine of 4.3. She was also found to have a severely supratherapeutic INR greater than 10 and pro time of 89 has been corrected. Patient had dark stool x2 yesterday morning in setting of severe coagulopathy. She likely had spontaneous oozing from her bowel or stomach, rule out gastritis or peptic ulcer disease  She  has not had any evidence of ongoing active bleeding  #2 history of pan diverticulosis with diverticular bleed January 2019 #3 atrial fibrillation #4 chronic anticoagulation with Coumadin #5 nonischemic cardiomyopathy with EF of 30-35% #6 Raynauds #7 hypertension #8 obesity #9 acute on chronic anemia-hemoglobin overall stable  Plan; Advance to heart healthy diet Continue serial hemoglobins IV PPI once daily No plans for endoscopy at present, unless she shows further evidence of active bleeding.  She may need upper endoscopy at some point during this admission.      Amy Esterwood  03/06/2018, 9:57 AM   Attending physician's note   I have taken an interval history, reviewed the chart and  examined the patient. I agree with the Advanced Practitioner's note, impression and recommendations.   67 year old with A. fib , CHF , hypertension , obesity admitted with acute on chronic renal insufficiency, Coumadin toxicity with INR greater than 10, had 2 dark stools in the setting of severe coagulopathy.  No further dark stools.  History of diverticular bleed January 2019 (had colonoscopy).  Plan: Reverse anticoagulation and maintain INR at the lowest therapeutic level if possible, trend CBC, hold off on any endoscopic procedures at the present time.  Manage conservatively.   Carmell Austria, MD

## 2018-03-06 NOTE — Progress Notes (Signed)
03/06/2018 patient is alert, oriented and ambulatory with a cane. Patient have bruises on arms and legs. Rn noted some swelling in ankles, more in right then in left. Patient receive a CHG bath and MRSA when arrive on unit. Foley was place 03/06/2018 in Emergency room, because of urinary retention. [facility] RN.

## 2018-03-07 ENCOUNTER — Encounter (HOSPITAL_COMMUNITY): Payer: Self-pay

## 2018-03-07 DIAGNOSIS — R195 Other fecal abnormalities: Secondary | ICD-10-CM

## 2018-03-07 DIAGNOSIS — N183 Chronic kidney disease, stage 3 (moderate): Secondary | ICD-10-CM

## 2018-03-07 LAB — CBC WITH DIFFERENTIAL/PLATELET
Basophils Absolute: 0 10*3/uL (ref 0.0–0.1)
Basophils Relative: 0 %
EOS ABS: 0.2 10*3/uL (ref 0.0–0.7)
Eosinophils Relative: 3 %
HCT: 29.9 % — ABNORMAL LOW (ref 36.0–46.0)
HEMOGLOBIN: 9.7 g/dL — AB (ref 12.0–15.0)
LYMPHS ABS: 1.2 10*3/uL (ref 0.7–4.0)
Lymphocytes Relative: 17 %
MCH: 29.4 pg (ref 26.0–34.0)
MCHC: 32.4 g/dL (ref 30.0–36.0)
MCV: 90.6 fL (ref 78.0–100.0)
MONOS PCT: 5 %
Monocytes Absolute: 0.3 10*3/uL (ref 0.1–1.0)
NEUTROS PCT: 75 %
Neutro Abs: 5.5 10*3/uL (ref 1.7–7.7)
Platelets: 136 10*3/uL — ABNORMAL LOW (ref 150–400)
RBC: 3.3 MIL/uL — ABNORMAL LOW (ref 3.87–5.11)
RDW: 17 % — ABNORMAL HIGH (ref 11.5–15.5)
WBC: 7.2 10*3/uL (ref 4.0–10.5)

## 2018-03-07 LAB — COMPREHENSIVE METABOLIC PANEL
ALK PHOS: 39 U/L (ref 38–126)
ALT: 16 U/L (ref 14–54)
ANION GAP: 9 (ref 5–15)
AST: 18 U/L (ref 15–41)
Albumin: 3.1 g/dL — ABNORMAL LOW (ref 3.5–5.0)
BILIRUBIN TOTAL: 0.9 mg/dL (ref 0.3–1.2)
BUN: 81 mg/dL — ABNORMAL HIGH (ref 6–20)
CALCIUM: 8.5 mg/dL — AB (ref 8.9–10.3)
CO2: 22 mmol/L (ref 22–32)
Chloride: 112 mmol/L — ABNORMAL HIGH (ref 101–111)
Creatinine, Ser: 2.9 mg/dL — ABNORMAL HIGH (ref 0.44–1.00)
GFR calc Af Amer: 19 mL/min — ABNORMAL LOW (ref >60)
GFR, EST NON AFRICAN AMERICAN: 16 mL/min — AB (ref >60)
Glucose, Bld: 103 mg/dL — ABNORMAL HIGH (ref 65–99)
Potassium: 3.8 mmol/L (ref 3.5–5.1)
Sodium: 143 mmol/L (ref 135–145)
TOTAL PROTEIN: 5.9 g/dL — AB (ref 6.5–8.1)

## 2018-03-07 LAB — HEMOGLOBIN AND HEMATOCRIT, BLOOD
HCT: 26.9 % — ABNORMAL LOW (ref 36.0–46.0)
HCT: 30.3 % — ABNORMAL LOW (ref 36.0–46.0)
HEMOGLOBIN: 8.7 g/dL — AB (ref 12.0–15.0)
HEMOGLOBIN: 9.5 g/dL — AB (ref 12.0–15.0)

## 2018-03-07 LAB — HEPARIN LEVEL (UNFRACTIONATED): Heparin Unfractionated: 0.31 IU/mL (ref 0.30–0.70)

## 2018-03-07 LAB — PHOSPHORUS: PHOSPHORUS: 3.2 mg/dL (ref 2.5–4.6)

## 2018-03-07 LAB — MAGNESIUM: Magnesium: 1.8 mg/dL (ref 1.7–2.4)

## 2018-03-07 MED ORDER — OXYBUTYNIN CHLORIDE ER 5 MG PO TB24
5.0000 mg | ORAL_TABLET | Freq: Every day | ORAL | Status: DC
  Administered 2018-03-07 – 2018-03-09 (×3): 5 mg via ORAL
  Filled 2018-03-07 (×4): qty 1

## 2018-03-07 MED ORDER — CARVEDILOL 3.125 MG PO TABS
3.1250 mg | ORAL_TABLET | Freq: Two times a day (BID) | ORAL | Status: DC
  Administered 2018-03-07 – 2018-03-10 (×6): 3.125 mg via ORAL
  Filled 2018-03-07 (×6): qty 1

## 2018-03-07 MED ORDER — HEPARIN (PORCINE) IN NACL 100-0.45 UNIT/ML-% IJ SOLN
1100.0000 [IU]/h | INTRAMUSCULAR | Status: DC
  Administered 2018-03-07 – 2018-03-08 (×2): 1100 [IU]/h via INTRAVENOUS
  Filled 2018-03-07 (×3): qty 250

## 2018-03-07 MED ORDER — RANOLAZINE ER 500 MG PO TB12
500.0000 mg | ORAL_TABLET | Freq: Two times a day (BID) | ORAL | Status: DC
  Administered 2018-03-07 – 2018-03-10 (×7): 500 mg via ORAL
  Filled 2018-03-07 (×7): qty 1

## 2018-03-07 MED ORDER — ISOSORB DINITRATE-HYDRALAZINE 20-37.5 MG PO TABS
1.0000 | ORAL_TABLET | Freq: Three times a day (TID) | ORAL | Status: DC
Start: 2018-03-07 — End: 2018-03-10
  Administered 2018-03-07 – 2018-03-10 (×10): 1 via ORAL
  Filled 2018-03-07 (×11): qty 1

## 2018-03-07 NOTE — Progress Notes (Addendum)
ANTICOAGULATION CONSULT NOTE - Initial Consult  Pharmacy Consult for heparin Indication: atrial fibrillation  Allergies  Allergen Reactions  . Ace Inhibitors Palpitations    Patient Measurements: Height: 5\' 5"  (165.1 cm) Weight: 239 lb 6.7 oz (108.6 kg) IBW/kg (Calculated) : 57 Heparin Dosing Weight: 82.5 kg  Vital Signs: Temp: 98.4 F (36.9 C) (04/14 0800) Temp Source: Oral (04/14 0800) BP: 153/79 (04/14 0800)  Labs: Recent Labs    03/05/18 1454  03/05/18 2329 03/06/18 0214 03/06/18 0610  03/06/18 1240 03/06/18 2305 03/07/18 0030 03/07/18 0703  HGB 9.3*  --  9.2* 9.6*  --    < > 9.1*  --  8.7* 9.7*  HCT 28.8*  --  28.5* 29.2*  --    < > 27.9*  --  26.9* 29.9*  PLT 146*  --  123* 126*  --   --   --   --   --  136*  APTT  --   --   --   --  25  --   --   --   --   --   LABPROT 89.1*   < > 16.5*  --  15.7*  --  14.8 14.6  --   --   INR >10.00*   < > 1.34  --  1.26  --  1.17 1.15  --   --   CREATININE 4.43*  --   --   --  4.28*  --   --   --   --  2.90*   < > = values in this interval not displayed.    Estimated Creatinine Clearance: 24 mL/min (A) (by C-G formula based on SCr of 2.9 mg/dL (H)).   Medical History: Past Medical History:  Diagnosis Date  . Arthritis   . Atrial fibrillation ([place])    a. s/p multiple cardioversions; failed tikosyn/sotalol.  Marland Kitchen CAD in native artery, s/p cardiac cath with non obstructive CAD 10/24/2016  . CHF (congestive heart failure) ([place])   . CKD (chronic kidney disease) stage 3, GFR 30-59 ml/min (HCC)   . DIVERTICULITIS, HX OF 07/25/2007  . DIVERTICULOSIS, COLON 07/22/2007  . Dyspnea   . Edema, peripheral    a. chronic BLE edema, R>L. Prior trauma from dog attack and accident.  Marland Kitchen HYPERLIPIDEMIA 02/03/2008  . Hypersomnia    declines w/u  . Hypertension   . MENOPAUSAL DISORDER 01/09/2011  . Morbid obesity ([place]) 07/22/2007  . NICM (nonischemic cardiomyopathy) ([place]) 10/24/2016  . Raynaud's syndrome 07/22/2007  . Stroke ([place]) 2017  .  THYROID NODULE, RIGHT 01/04/2010  . VITAMIN D DEFICIENCY 01/09/2011   Qualifier: Diagnosis of  By: [name], [name]     Medications:  Scheduled:  . amiodarone  200 mg Oral Daily  . carvedilol  3.125 mg Oral BID WC  . isosorbide-hydrALAZINE  1 tablet Oral TID  . pantoprazole (PROTONIX) IV  40 mg Intravenous Q24H  . ranolazine  500 mg Oral BID  . sodium chloride flush  3 mL Intravenous Q12H   Infusions:  . heparin      Assessment: 66 YOF on warfarin PTA admitted 4/12 with active GI bleed and INR > 10 in the setting of acute urinary retention and AKI. Patient given Eppie Gibson, vitamin K 10 mg IV x 1 and 1 unit PRBC in ED and continues on IV PPI. INR is now down to 1.15, CBC is low but stable, no further bleedin noted. Cardiology has now consulted pharmacy to start heparin until renal function  stabilizes for DOAC initiation. With recent major bleed, will plan to initiate heparin without bolus and use lower goal.   Goal of Therapy:  Heparin level 0.3-0.5 units/ml Monitor platelets by anticoagulation protocol: Yes   Plan:  Start heparin infusion at 1100 units/hr - no bolus Check anti-Xa level in 8 hours and daily while on heparin Monitor CBC, s/sx of bleeding   Charlene Brooke, PharmD PGY1 Pharmacy Resident Phone: [phone] After 3:30PM please call [facility] [phone] 03/07/2018,11:14 AM   Addendum: Initial heparin level is therapeutic at 0.31. Continue heparin at 1100 units/hr and confirm level with AM labs. No signs of bleeding noted.   Jimmy Footman, PharmD, BCPS PGY2 Infectious Diseases Pharmacy Resident Pager: [phone]

## 2018-03-07 NOTE — Progress Notes (Signed)
Progress Note  Patient Name: Patricia Wagner Date of Encounter: 03/07/2018  Primary Cardiologist: No primary care provider on file.   Subjective   Feeling well.  Resting comfortably.  Inpatient Medications    Scheduled Meds: . amiodarone  200 mg Oral Daily  . carvedilol  3.125 mg Oral BID WC  . isosorbide-hydrALAZINE  1 tablet Oral TID  . pantoprazole (PROTONIX) IV  40 mg Intravenous Q24H  . ranolazine  500 mg Oral BID  . sodium chloride flush  3 mL Intravenous Q12H   Continuous Infusions:  PRN Meds: acetaminophen **OR** acetaminophen, fentaNYL (SUBLIMAZE) injection, ondansetron **OR** ondansetron (ZOFRAN) IV   Vital Signs    Vitals:   03/06/18 1937 03/06/18 2328 03/07/18 0419 03/07/18 0800  BP: (!) 158/70 (!) 151/70 (!) 144/64 (!) 153/79  Pulse:      Resp:      Temp: 98.5 F (36.9 C) 98.6 F (37 C) 98.5 F (36.9 C) 98.4 F (36.9 C)  TempSrc: Oral Oral Oral Oral  SpO2: 93% 94% 99% 92%  Weight:      Height:        Intake/Output Summary (Last 24 hours) at 03/07/2018 1046 Last data filed at 03/07/2018 0420 Gross per 24 hour  Intake 483 ml  Output 4475 ml  Net -3992 ml   Filed Weights   03/06/18 1700  Weight: 239 lb 6.7 oz (108.6 kg)    Telemetry    Sinus rhythm.  PVCs.  - Personally Reviewed  ECG    n/a - Personally Reviewed  Physical Exam   VS:  BP (!) 153/79 (BP Location: Left Arm)   Pulse (!) 59   Temp 98.4 F (36.9 C) (Oral)   Resp 14   Ht 5\' 5"  (1.651 m)   Wt 239 lb 6.7 oz (108.6 kg)   SpO2 92%   BMI 39.84 kg/m  , BMI Body mass index is 39.84 kg/m. GENERAL:  Well appearing.  No acute distress.  HEENT: Pupils equal round and reactive, fundi not visualized, oral mucosa unremarkable NECK:  No jugular venous distention, waveform within normal limits, carotid upstroke brisk and symmetric, no bruits LUNGS:  Clear to auscultation bilaterally HEART:  RRR.  PMI not displaced or sustained,S1 and S2 within normal limits, no S3, no S4, no clicks,  no rubs, no murmurs ABD:  Flat, positive bowel sounds normal in frequency in pitch, no bruits, no rebound, no guarding, no midline pulsatile mass, no hepatomegaly, no splenomegaly EXT:  2 plus pulses throughout, 2+ LE edema, no cyanosis no clubbing SKIN:  No rashes no nodules NEURO:  Cranial nerves II through XII grossly intact, motor grossly intact throughout [facility]:  Cognitively intact, oriented to person place and time   Labs    Chemistry Recent Labs  Lab 03/05/18 1454 03/06/18 0610 03/07/18 0703  NA 140 142 143  K 4.2 3.7 3.8  CL 110 115* 112*  CO2 19* 17* 22  GLUCOSE 99 84 103*  BUN 120* 117* 81*  CREATININE 4.43* 4.28* 2.90*  CALCIUM 8.7* 8.6* 8.5*  PROT 6.2* 5.6* 5.9*  ALBUMIN 3.3* 3.1* 3.1*  AST 12* 15 18  ALT 12* 13* 16  ALKPHOS 34* 33* 39  BILITOT 0.5 1.2 0.9  GFRNONAA 10* 10* 16*  GFRAA 11* 12* 19*  ANIONGAP 11 10 9      Hematology Recent Labs  Lab 03/05/18 2329 03/06/18 0214  03/06/18 1240 03/07/18 0030 03/07/18 0703  WBC 6.3 6.6  --   --   --  7.2  RBC 3.21* 3.25*  --   --   --  3.30*  HGB 9.2* 9.6*   < > 9.1* 8.7* 9.7*  HCT 28.5* 29.2*   < > 27.9* 26.9* 29.9*  MCV 88.8 89.8  --   --   --  90.6  MCH 28.7 29.5  --   --   --  29.4  MCHC 32.3 32.9  --   --   --  32.4  RDW 16.4* 16.8*  --   --   --  17.0*  PLT 123* 126*  --   --   --  136*   < > = values in this interval not displayed.    Cardiac EnzymesNo results for input(s): TROPONINI in the last 168 hours. No results for input(s): TROPIPOC in the last 168 hours.   BNPNo results for input(s): BNP, PROBNP in the last 168 hours.   DDimer No results for input(s): DDIMER in the last 168 hours.   Radiology    US Renal  Result Date: 03/05/2018 CLINICAL DATA:  Stage 3 chronic renal disease with acute renal deterioration EXAM: RENAL / URINARY TRACT ULTRASOUND COMPLETE COMPARISON:  CT abdomen and pelvis December 02, 2017 FINDINGS: Right Kidney: Length: 11.3 cm. Echogenicity and renal cortical thickness  are within normal limits. No perinephric fluid visualized. There is moderately severe hydronephrosis. There is a cyst arising from the lower pole of the right kidney measuring 2.0 x 1.4 x 1.8 cm. No sonographically demonstrable calculus or ureterectasis. Left Kidney: Length: 11.0 cm. Echogenicity and renal cortical thickness are within normal limits. No perinephric fluid visualized. There is moderate hydronephrosis on the left. There is a cyst arising from the mid left kidney measuring 4.1 x 3.3 x 3.1 cm. No sonographically demonstrable calculus or ureterectasis. Bladder: Appears normal for degree of bladder distention. Urinary bladder is distended. Patient was unable to void. IMPRESSION: Bilateral hydronephrosis, slightly more severe on the right than on the left. Distended urinary bladder. Patient was unable to void. Question bladder outlet obstruction with hydronephrosis secondary to stasis phenomenon. There are renal cysts bilaterally. Renal cortical thickness and echogenicity within normal limits bilaterally. Electronically Signed   By: Lowella Grip III M.D.   On: 03/05/2018 21:35   Dg Chest Port 1 View  Result Date: 03/05/2018 CLINICAL DATA:  Blood in stool EXAM: PORTABLE CHEST 1 VIEW COMPARISON:  12/29/2017 FINDINGS: Cardiomegaly with mild vascular congestion. Airspace disease at the left lung base. Aortic atherosclerosis. No pneumothorax. IMPRESSION: 1. Cardiomegaly with mild vascular congestion 2. Airspace disease at the left base, atelectasis versus pneumonia Electronically Signed   By: Donavan Foil M.D.   On: 03/05/2018 18:52    Cardiac Studies   Cath 10/23/2016 Conclusion   Conclusions: 1. Mild, non-obstructive coronary artery disease consistent with non-ischemic cardiomyopathy. 2. Mildly elevated left ventricular filling pressure.  Recommendations: 1. Continue medical treatment of non-ischemic cardiomyopathy and atrial fibrillation with rapid ventricular response. 2. Primary  prevention to prevent progression of mild CAD. 3. Restart apixaban at 2200 tonight if right radial arteriotomy site is benign with evidence of bleeding.      Echo 07/15/2017 LV EF: 30% - 35%  Study Conclusions  - Left ventricle: The cavity size was normal. There was severe concentric hypertrophy. Systolic function was moderately to severely reduced. The estimated ejection fraction was in the range of 30% to 35%. Diffuse hypokinesis. - Mitral valve: There was mild to moderate regurgitation. - Left atrium: The atrium was severely dilated. - Right ventricle:  The cavity size was mildly dilated. Wall thickness was normal. Systolic function was moderately reduced. - Right atrium: The atrium was moderately dilated. - Pericardium, extracardiac: A trivial pericardial effusion was identified. Features were not consistent with tamponade physiology.  Impressions:  - When compared to the prior study from 03/25/2017, LVEF is mildly improved from 15% to 30-30%, RVEF remains moderately decreased. There is severe concentric LVH, moderate to severe bi-atrial dilatation and valves thickening. Consider further workup for cardiac amyloidosis.     Patient Profile     Patricia Wagner is a 64F with chronic systolic and diastolic heart failure (LVEF 30-35%), hypertension, hyperlipidemia, and persistent atrial fibrillation here with acute renal failure in the setting of obstructive uropathy and GI bleed with INR >10.   Assessment & Plan    # Chronic systolic and diastolic heart failure:  Patricia Wagner is autodiuresisng.  She has lower extremity edema but no JVD.  She is lying flat in bed comfortably.  Renal function improving.  We will resume her home carvedilol and BiDil.  Continue to hold Lasix.  # Persistent atrial fibrillation: Sinus rhythm on telemetry.  Warfarin on hold for INR >10 on admission.  It came down to 1.9 with vitamin K and prothrombin.  H/H stable.  Will start  heparin.  Consider Eliquis or Xarelto once renal function stabilizes.  Continue amiodarone and resume carvedilol as above.  Restart ranolazine.   # Hypertension: Blood pressure above goal.  Resuming BiDil and carvedilol as above.  # Obstructive uropathy: Foley catheter in place and urology is following.   For questions or updates, please contact [place] Please consult [email or web address] for contact info under Cardiology/STEMI.      Signed, Skeet Latch, MD  03/07/2018, 10:46 AM

## 2018-03-07 NOTE — Progress Notes (Signed)
Progress Note   Subjective  She has been doing well this morning. Had 1 episode of rectal bleeding which is resolved. This was associated with constipation. Hemoglobin stable   Objective   Vital signs in last 24 hours: Temp:  [98.4 F (36.9 C)-98.6 F (37 C)] 98.4 F (36.9 C) (04/14 0800) Pulse Rate:  [58-67] 59 (04/13 1615) Resp:  [14-15] 14 (04/13 1530) BP: (144-163)/(61-79) 148/73 (04/14 1200) SpO2:  [92 %-99 %] 99 % (04/14 1200) Weight:  [239 lb 6.7 oz (108.6 kg)] 239 lb 6.7 oz (108.6 kg) (04/13 1700) Last BM Date: 03/07/18 General:    white female in NAD Heart:  Regular rate and rhythm; no murmurs Lungs: Respirations even and unlabored, lungs CTA bilaterally Abdomen:  Soft, nontender and nondistended. Normal bowel sounds. Extremities:  Without edema. Neurologic:  Alert and oriented,  grossly normal neurologically. Psych:  Cooperative. Normal mood and affect.  Intake/Output from previous day: 04/13 0701 - 04/14 0700 In: 483 [P.O.:480; I.V.:3] Out: 6525 [Urine:6525] Intake/Output this shift: No intake/output data recorded.  Lab Results: Recent Labs    03/05/18 2329 03/06/18 0214  03/07/18 0030 03/07/18 0703 03/07/18 1250  WBC 6.3 6.6  --   --  7.2  --   HGB 9.2* 9.6*   < > 8.7* 9.7* 9.5*  HCT 28.5* 29.2*   < > 26.9* 29.9* 30.3*  PLT 123* 126*  --   --  136*  --    < > = values in this interval not displayed.   BMET Recent Labs    03/05/18 1454 03/06/18 0610 03/07/18 0703  NA 140 142 143  K 4.2 3.7 3.8  CL 110 115* 112*  CO2 19* 17* 22  GLUCOSE 99 84 103*  BUN 120* 117* 81*  CREATININE 4.43* 4.28* 2.90*  CALCIUM 8.7* 8.6* 8.5*   LFT Recent Labs    03/07/18 0703  PROT 5.9*  ALBUMIN 3.1*  AST 18  ALT 16  ALKPHOS 39  BILITOT 0.9   PT/INR Recent Labs    03/06/18 1240 03/06/18 2305  LABPROT 14.8 14.6  INR 1.17 1.15    Studies/Results: US Renal  Result Date: 03/05/2018 CLINICAL DATA:  Stage 3 chronic renal disease with acute renal  deterioration EXAM: RENAL / URINARY TRACT ULTRASOUND COMPLETE COMPARISON:  CT abdomen and pelvis December 02, 2017 FINDINGS: Right Kidney: Length: 11.3 cm. Echogenicity and renal cortical thickness are within normal limits. No perinephric fluid visualized. There is moderately severe hydronephrosis. There is a cyst arising from the lower pole of the right kidney measuring 2.0 x 1.4 x 1.8 cm. No sonographically demonstrable calculus or ureterectasis. Left Kidney: Length: 11.0 cm. Echogenicity and renal cortical thickness are within normal limits. No perinephric fluid visualized. There is moderate hydronephrosis on the left. There is a cyst arising from the mid left kidney measuring 4.1 x 3.3 x 3.1 cm. No sonographically demonstrable calculus or ureterectasis. Bladder: Appears normal for degree of bladder distention. Urinary bladder is distended. Patient was unable to void. IMPRESSION: Bilateral hydronephrosis, slightly more severe on the right than on the left. Distended urinary bladder. Patient was unable to void. Question bladder outlet obstruction with hydronephrosis secondary to stasis phenomenon. There are renal cysts bilaterally. Renal cortical thickness and echogenicity within normal limits bilaterally. Electronically Signed   By: Lowella Grip III M.D.   On: 03/05/2018 21:35   Dg Chest Port 1 View  Result Date: 03/05/2018 CLINICAL DATA:  Blood in stool EXAM: PORTABLE CHEST 1 VIEW COMPARISON:  12/29/2017 FINDINGS: Cardiomegaly with mild vascular congestion. Airspace disease at the left lung base. Aortic atherosclerosis. No pneumothorax. IMPRESSION: 1. Cardiomegaly with mild vascular congestion 2. Airspace disease at the left base, atelectasis versus pneumonia Electronically Signed   By: Donavan Foil M.D.   On: 03/05/2018 18:52       Assessment / Plan:    67 year old with A. fib , CHF , hypertension , obesity admitted with acute on chronic renal insufficiency, Coumadin toxicity with INR greater  than 10,  No further significant bleeding.  History of diverticular bleed January 2019 ( s/p colonoscopy).    Plan: -recommend MiraLAX 17 g by mouth once a day for symptomatic management of constipation. -Current CBC. -Since she had colonoscopy in January 2019, no plans to repeat colonoscopy. -Preparation H or Anusol suppositories if there is any further recurrent bleeding. -Will sign off for now. -can resume anticoagulation from the GI standpoint.   Carmell Austria MD

## 2018-03-07 NOTE — Progress Notes (Signed)
PROGRESS NOTE    Patricia Wagner  ZOX:[id number] DOB: Jul 21, 1951 DOA: 03/05/2018 PCP: Biagio Borg, MD   Brief Narrative:  The patient is a very pleasant 67 yo Morbidly Obese AAF with a PMH of Atrial Fibrillation s/p Multiple DCCV Cardioversions on Anticoagulation with Warfarin, NICM with Chronic Biventricular CHF, Hx of Diverticulosis, HTN, HLD, Hx of Urinary Retention and UTI, Raynaud's Syndrome, CKD Stage 3 with Baseline Cr of 1.3-1.5, Hx of Recurrent Cardiogenic Shock and other comorbidities who presented to [facility] ED because of Dark Tarry Stools that started this Am.  Upon discussion with the patient she saw her Primary Heart Failure Cardiologist [name] yesterday who evaluated her for her Heart Failure. She had blood work drawn at the office and which revealed a severe derangement in Her Kidneys. Of note she was found to be more fatigued and this AM when she bent over to tie her shoe became lightheaded.   She presented to [facility] ED and was found to have Elevated INR and FOBT Positive so she was given Kcentra, Vitamin K, and transfused 1 unit. She is being admitted for AKI on CKD Stage 3, Suspected Upper GIB in the setting of Coagulopathy from Coumadin Toxicity, Acute Exacerbation of Chronic Biventricular CHF.   Cardiology, Gastroenterology, and Urology Consulted for further evaluation and recommendations. GI recommending Miralax and Cardiology recommending Heparin gtt for A Fib and changing to focus of Xarelto when the renal function looked improved.  Urology recommended continuing Foley catheter and repeating a renal ultrasound in the morning.  Assessment & Plan:   Principal Problem:   Acute kidney injury ([place]) Active Problems:   CKD (chronic kidney disease) stage 3, GFR 30-59 ml/min (HCC)   Coagulopathy (HCC)   NICM (nonischemic cardiomyopathy) (HCC)   Chronic systolic (congestive) heart failure (HCC)   Chronic atrial fibrillation (HCC)   Hypertension   Acute blood  loss anemia   Diverticulosis   Heme positive stool   Rectal bleed  Acute kidney injury on CKD (chronic kidney disease) stage 3, GFR 30-59 ml/min, improving  -Patient has developed in the past 48 hours worsening of renal function in the context of preadmission utilization of Entresto and Lasix; suspected that occult/initial onset of GI bleeding with associated likely hypotension i.e. decreased renal perfusion also contributing but now feel like it is mostly obstructive Uropathy and Acute Urinary Retention  -Baseline renal function: 7/1.44 -BUN/Cr Improved from 120/4.43 -> 117/4.28 -> 81/2.90 -Patient previously instructed to hold Entresto and Lasix; -Heart elevation in BUN over rapid timeframe without altered mentation more suggestive of acute process such as GI bleeding/azotemia -Bladder scan due to history of urinary retention showed >999 -Renal ultrasound showed bilateral Hydro -Follow labs and avoid nephrotoxic medications -Treat underlying causes and placed Foley -Potentially acute systolic heart failure also contributing to acute renal dysfunction  -Urology Consulted and evaluated and recommending continue Foley at this time.  Dr. Junious Silk also recommends following up with Dr. Pilar Jarvis for CIC retraining versus continued Foley use versus suprapubic tube.  Dr. Junious Silk also recommends rechecking a renal ultrasound in 1-2 days and we will do that in the a.m. -She was having bladder spasms we will add Ditropan 5 mg nightly. -Continue to Monitor and Repeat CMP in AM   Coagulopathy/Thrombocytopenia -Patient presented with a therapeutic INR > 10.00 -Reversal agents of vitamin K and K Centra given with pharmacy managing follow-up lab -Continue to hold Warfarin; cardiology starting patient on heparin and considering transition to Eliquis or Xarelto once renal  stabilizes. -Low platelets likely reflective of bone marrow consumption with acute evolving anemia -PT/INR improved and was 14.6/1.15;  Platelet Count now 136K   Acute blood loss anemia -Baseline hemoglobin around 8.4-9.6 current hemoglobin on admit was 9.3 but do not suspect patient has equilibrated since bleeding onset only several hours prior -Agree with EDP decision to give at least 1 unit PRBCs -Order placed for RN to obtain CBC after blood infused-may require additional PRBCs in the next 24 hours -Her Hemoccult positive and agree with GI consultation (see below) -Repeat HH is 9.5/30.3 -No back pain but could be experiencing spontaneous retroperitoneal hemorrhage continue to monitor-may require additional imaging  NICM (nonischemic cardiomyopathy)/Chronic Biventricular Heart Failure  -History of biventricular heart failure [name] diagnosed May 2018 noting EF at that time 15% with improvement to 30-35% on follow-up echo -Echocardiogram August 2018: Severe concentric LVH with an EF of 30-35% -Has significant bilateral lower extremity edema and weight is up to 242 lbss from dry weight reported at 220 lbs -Given acute kidney injury and relative hypotension all home heart failure meds have been placed on hold (Lasix, Entresto and carvedilol) -Cardiology Consulted and appreciate Evaluation -Daily weights, strict I's/O -Patient is -9.719 Liters (autodiuresing) and Weight is 239 lbs (Not done today) -Repeat echocardiogram this admission showed EF of 40-45% and Grade 1 Diastolic Dysfunction  -Currently maintaining sinus rhythm-previous heart failure felt to be tachycardia mediated in the context of atrial fibrillation with RVR -Cardiology evaluated and recommend holding Lasix for now; cardiology is starting carvedilol and BiDil back today.  Chronic Atrial Fibrillation  -Maintaining sinus rhythm on Amiodarone- -Warfarin on hold in context of acute GI bleeding and will be discontinued per cardiology.  They will start heparin drip and consider Eliquis or Xarelto once her renal function improves. -CHA2DS2-VASc Score and  unadjusted Ischemic Stroke Rate (% per year) is equal to 3.2 % stroke rate/year from a score of 3 -Pharmacy consulted for heparin drip dosing. -Cardiology Restarting Ranexa   Diverticulosis/Heme positive stool and GIB in the setting of Coagulapathy  -Patient with known diverticulosis based on prior CT the abdomen -Reports history of prior GI bleeding -Colonoscopy 12/04/17 revealed normal exam noting diverticulosis of the large intestine without perforation or abscess signs of active bleeding -Upper abdominal pain therefore bleeding could be upper GI or small bowel related -Continue Protonix 40 mg IV every Daily and change to p.o. -Formal GI consultation done they recommending conservative management and have no plans for endoscopy at present unless she has further active bleeding. They feel rectal bleeding was related to Constipation. -Dr. Lyndel Safe does recommending MiraLAX 17 g once a day for symptomatic judgment of constipation.  He also recommends Preparation H or Anusol suppositories if there is any further recurrent bleeding and recommends that anticoagulation can be restarted from a GI standpoint. -Stop HH Labs -Repeat CBC in AM   Hypertension -Current blood pressure suboptimal and cardiac medications on hold as above -Cardiology restarting carvedilol 3.125 mg twice daily and isosorbide hydralazine 1 tablet p.o. 3 times daily -Continue to Monitor  DVT prophylaxis: SCDs; Cardiology started patient on Heparin gtt Code Status: FULL CODE  Family Communication: No family present at bedside  Disposition Plan: Remain Inpatient for continued work up and obtain PT/OT Evaluation  Consultants:   Gastroenterology  Cardiology  Urology   Procedures:    Antimicrobials:  Anti-infectives (From admission, onward)   None     Subjective: Seen and examined this a.m. and was complaining of lower bladder spasms.  Denied  any chest pain or shortness of breath.  Felt okay and had no other  complaints or concerns at this time.  Objective: Vitals:   03/07/18 0800 03/07/18 1100 03/07/18 1200 03/07/18 1620  BP: (!) 153/79 (!) 163/71 (!) 148/73 110/62  Pulse:    63  Resp:    18  Temp: 98.4 F (36.9 C)   98.3 F (36.8 C)  TempSrc: Oral   Oral  SpO2: 92% 98% 99% 100%  Weight:      Height:        Intake/Output Summary (Last 24 hours) at 03/07/2018 1903 Last data filed at 03/07/2018 1851 Gross per 24 hour  Intake 483 ml  Output 5275 ml  Net -4792 ml   Filed Weights   03/06/18 1700  Weight: 108.6 kg (239 lb 6.7 oz)   Examination: Physical Exam:  Constitutional: Well-nourished well-developed obese African-American female currently in no acute distress.  She is complaining of lower abdominal spasms intermittently. Eyes: Sclera anicteric.  Conjunctival noninjected. ENMT: External ears and nose appear normal.  Grossly normal hearing.  Mucous membranes moist Neck: Supple with no JVD. Respiratory: Diminished to auscultation slightly.  Unlabored breathing and no appreciable wheezing, rales, rhonchi. Cardiovascular: Regular rate and rhythm; no appreciable murmurs rubs or gallops.  2+ lower extremity edema noted Abdomen: Soft, mildly tender lower abdomen.  Distended secondary to body habitus.  Bowel sounds present in all 4 quadrants  GU: Deferred.  Patient has a Foley catheter in place Musculoskeletal: No clubbing, no cyanosis.  No contractures Skin: Has some bruising in the right shoulder and lower extremities bilaterally.  No appreciable rashes noted Neurologic: Cranial nerves II through XII grossly intact with no appreciable focal deficits. Psychiatric: Normal mood and affect.  Intact judgment and insight.  Alert and oriented x3  Data Reviewed: I have personally reviewed following labs and imaging studies  CBC: Recent Labs  Lab 03/05/18 1454 03/05/18 2329 03/06/18 0214 03/06/18 0611 03/06/18 1240 03/07/18 0030 03/07/18 0703 03/07/18 1250  WBC 6.6 6.3 6.6  --   --    --  7.2  --   NEUTROABS  --   --  4.8  --   --   --  5.5  --   HGB 9.3* 9.2* 9.6* 9.0* 9.1* 8.7* 9.7* 9.5*  HCT 28.8* 28.5* 29.2* 28.0* 27.9* 26.9* 29.9* 30.3*  MCV 91.4 88.8 89.8  --   --   --  90.6  --   PLT 146* 123* 126*  --   --   --  136*  --    Basic Metabolic Panel: Recent Labs  Lab 03/04/18 1512 03/05/18 1454 03/06/18 0214 03/06/18 0610 03/07/18 0703  NA 140 140  --  142 143  K 3.8 4.2  --  3.7 3.8  CL 108 110  --  115* 112*  CO2 21* 19*  --  17* 22  GLUCOSE 105* 99  --  84 103*  BUN 98* 120*  --  117* 81*  CREATININE 4.30* 4.43*  --  4.28* 2.90*  CALCIUM 8.8* 8.7*  --  8.6* 8.5*  MG  --   --  2.0  --  1.8  PHOS  --   --  3.8  --  3.2   GFR: Estimated Creatinine Clearance: 24 mL/min (A) (by C-G formula based on SCr of 2.9 mg/dL (H)). Liver Function Tests: Recent Labs  Lab 03/05/18 1454 03/06/18 0610 03/07/18 0703  AST 12* 15 18  ALT 12* 13* 16  ALKPHOS 34*  33* 39  BILITOT 0.5 1.2 0.9  PROT 6.2* 5.6* 5.9*  ALBUMIN 3.3* 3.1* 3.1*   No results for input(s): LIPASE, AMYLASE in the last 168 hours. No results for input(s): AMMONIA in the last 168 hours. Coagulation Profile: Recent Labs  Lab 03/05/18 1817 03/05/18 2329 03/06/18 0610 03/06/18 1240 03/06/18 2305  INR 1.54 1.34 1.26 1.17 1.15   Cardiac Enzymes: No results for input(s): CKTOTAL, CKMB, CKMBINDEX, TROPONINI in the last 168 hours. BNP (last 3 results) No results for input(s): PROBNP in the last 8760 hours. HbA1C: No results for input(s): HGBA1C in the last 72 hours. CBG: No results for input(s): GLUCAP in the last 168 hours. Lipid Profile: No results for input(s): CHOL, HDL, LDLCALC, TRIG, CHOLHDL, LDLDIRECT in the last 72 hours. Thyroid Function Tests: No results for input(s): TSH, T4TOTAL, FREET4, T3FREE, THYROIDAB in the last 72 hours. Anemia Panel: No results for input(s): VITAMINB12, FOLATE, FERRITIN, TIBC, IRON, RETICCTPCT in the last 72 hours. Sepsis Labs: No results for  input(s): PROCALCITON, LATICACIDVEN in the last 168 hours.  Recent Results (from the past 240 hour(s))  Culture, Urine     Status: Abnormal (Preliminary result)   Collection Time: 03/06/18 12:29 AM  Result Value Ref Range Status   Specimen Description URINE, CATHETERIZED  Final   Special Requests NONE  Final   Culture (A)  Final    >=100,000 COLONIES/mL STAPHYLOCOCCUS SPECIES (COAGULASE NEGATIVE) SUSCEPTIBILITIES TO FOLLOW Performed at [facility], [address], [place], [place] [address]    Report Status PENDING  Incomplete  MRSA PCR Screening     Status: None   Collection Time: 03/06/18  4:08 PM  Result Value Ref Range Status   MRSA by PCR NEGATIVE NEGATIVE Final    Comment:        The GeneXpert MRSA Assay (FDA approved for NASAL specimens only), is one component of a comprehensive MRSA colonization surveillance program. It is not intended to diagnose MRSA infection nor to guide or monitor treatment for MRSA infections. Performed at [facility], [place] [address], [place], [place] [address]     Radiology Studies: US Renal  Result Date: 03/05/2018 CLINICAL DATA:  Stage 3 chronic renal disease with acute renal deterioration EXAM: RENAL / URINARY TRACT ULTRASOUND COMPLETE COMPARISON:  CT abdomen and pelvis December 02, 2017 FINDINGS: Right Kidney: Length: 11.3 cm. Echogenicity and renal cortical thickness are within normal limits. No perinephric fluid visualized. There is moderately severe hydronephrosis. There is a cyst arising from the lower pole of the right kidney measuring 2.0 x 1.4 x 1.8 cm. No sonographically demonstrable calculus or ureterectasis. Left Kidney: Length: 11.0 cm. Echogenicity and renal cortical thickness are within normal limits. No perinephric fluid visualized. There is moderate hydronephrosis on the left. There is a cyst arising from the mid left kidney measuring 4.1 x 3.3 x 3.1 cm. No sonographically demonstrable calculus or ureterectasis. Bladder:  Appears normal for degree of bladder distention. Urinary bladder is distended. Patient was unable to void. IMPRESSION: Bilateral hydronephrosis, slightly more severe on the right than on the left. Distended urinary bladder. Patient was unable to void. Question bladder outlet obstruction with hydronephrosis secondary to stasis phenomenon. There are renal cysts bilaterally. Renal cortical thickness and echogenicity within normal limits bilaterally. Electronically Signed   By: Lowella Grip III M.D.   On: 03/05/2018 21:35   Scheduled Meds: . amiodarone  200 mg Oral Daily  . carvedilol  3.125 mg Oral BID WC  . isosorbide-hydrALAZINE  1 tablet Oral TID  . pantoprazole (PROTONIX) IV  40 mg Intravenous Q24H  . ranolazine  500 mg Oral BID  . sodium chloride flush  3 mL Intravenous Q12H   Continuous Infusions: . heparin 1,100 Units/hr (03/07/18 1155)    LOS: 2 days   Kerney Elbe, DO [facility] Pager [phone]  If 7PM-7AM, please contact night-coverage [email or web address] Password [facility] 03/07/2018, 7:03 PM

## 2018-03-08 ENCOUNTER — Inpatient Hospital Stay (HOSPITAL_COMMUNITY): Payer: BLUE CROSS/BLUE SHIELD

## 2018-03-08 LAB — COMPREHENSIVE METABOLIC PANEL
ALT: 14 U/L (ref 14–54)
AST: 13 U/L — AB (ref 15–41)
Albumin: 2.8 g/dL — ABNORMAL LOW (ref 3.5–5.0)
Alkaline Phosphatase: 31 U/L — ABNORMAL LOW (ref 38–126)
Anion gap: 7 (ref 5–15)
BILIRUBIN TOTAL: 0.8 mg/dL (ref 0.3–1.2)
BUN: 51 mg/dL — AB (ref 6–20)
CO2: 25 mmol/L (ref 22–32)
Calcium: 7.9 mg/dL — ABNORMAL LOW (ref 8.9–10.3)
Chloride: 112 mmol/L — ABNORMAL HIGH (ref 101–111)
Creatinine, Ser: 2.14 mg/dL — ABNORMAL HIGH (ref 0.44–1.00)
GFR, EST AFRICAN AMERICAN: 27 mL/min — AB (ref >60)
GFR, EST NON AFRICAN AMERICAN: 23 mL/min — AB (ref >60)
Glucose, Bld: 100 mg/dL — ABNORMAL HIGH (ref 65–99)
POTASSIUM: 3.5 mmol/L (ref 3.5–5.1)
Sodium: 144 mmol/L (ref 135–145)
TOTAL PROTEIN: 5.3 g/dL — AB (ref 6.5–8.1)

## 2018-03-08 LAB — CBC WITH DIFFERENTIAL/PLATELET
Basophils Absolute: 0 10*3/uL (ref 0.0–0.1)
Basophils Relative: 0 %
Eosinophils Absolute: 0.2 10*3/uL (ref 0.0–0.7)
Eosinophils Relative: 3 %
HEMATOCRIT: 26.1 % — AB (ref 36.0–46.0)
Hemoglobin: 8.5 g/dL — ABNORMAL LOW (ref 12.0–15.0)
LYMPHS ABS: 1.2 10*3/uL (ref 0.7–4.0)
LYMPHS PCT: 20 %
MCH: 29.7 pg (ref 26.0–34.0)
MCHC: 32.6 g/dL (ref 30.0–36.0)
MCV: 91.3 fL (ref 78.0–100.0)
MONO ABS: 0.3 10*3/uL (ref 0.1–1.0)
MONOS PCT: 5 %
NEUTROS ABS: 4.2 10*3/uL (ref 1.7–7.7)
Neutrophils Relative %: 72 %
Platelets: 127 10*3/uL — ABNORMAL LOW (ref 150–400)
RBC: 2.86 MIL/uL — ABNORMAL LOW (ref 3.87–5.11)
RDW: 16.8 % — AB (ref 11.5–15.5)
WBC: 5.9 10*3/uL (ref 4.0–10.5)

## 2018-03-08 LAB — URINE CULTURE

## 2018-03-08 LAB — HEPARIN LEVEL (UNFRACTIONATED): Heparin Unfractionated: 0.34 IU/mL (ref 0.30–0.70)

## 2018-03-08 LAB — MAGNESIUM: MAGNESIUM: 1.5 mg/dL — AB (ref 1.7–2.4)

## 2018-03-08 LAB — PHOSPHORUS: Phosphorus: 2.7 mg/dL (ref 2.5–4.6)

## 2018-03-08 MED ORDER — MAGNESIUM SULFATE 2 GM/50ML IV SOLN
2.0000 g | Freq: Once | INTRAVENOUS | Status: AC
  Administered 2018-03-08: 2 g via INTRAVENOUS
  Filled 2018-03-08: qty 50

## 2018-03-08 MED ORDER — ATORVASTATIN CALCIUM 40 MG PO TABS
40.0000 mg | ORAL_TABLET | Freq: Every day | ORAL | Status: DC
  Administered 2018-03-08 – 2018-03-09 (×2): 40 mg via ORAL
  Filled 2018-03-08 (×2): qty 1

## 2018-03-08 MED ORDER — PANTOPRAZOLE SODIUM 40 MG PO TBEC
40.0000 mg | DELAYED_RELEASE_TABLET | Freq: Every day | ORAL | Status: DC
  Administered 2018-03-09 – 2018-03-10 (×2): 40 mg via ORAL
  Filled 2018-03-08 (×2): qty 1

## 2018-03-08 NOTE — Consult Note (Addendum)
Advanced Heart Failure Team Consult Note   Primary Physician: Biagio Borg, MD  HF MD Dr Vaughan Browner Cardiologist: Dr [name] Reason for Consultation: Heart Failure   HPI:    Patricia Wagner is seen today for evaluation of heart failure at the request of Dr Alfredia Ferguson.   Patricia Wagner is a 67 year old with h/o PAF, CVA NICM, biventricular heart failure, HTN, and hyperlipidemia. Multiple cardioversions.   Pt had repeat DCCV 05/12/17 with successful conversion to NSR. Amio gtt continued to ensure adequate load. Transitioned to po 05/15/17. Ranexa also added to her regimen for added anti-arrhythmic benefit. Sleep study ordered to be completed in inpatient setting. HF meds optimized as tolerated.  PT recommended SNF, but pt preferred to return home with HHPT.   Admitted 2/5 through 01/08/2018 with UTI and AKI. Creatinine peaked at 7. Lasix, spiro, and entresto stopped. Required home IV antibiotics and discharged with foley. At the time of discharge creatinine was down to 1.07.   She had follow up with urology a few weeks ago. Foley was removed and she started performing I/O caths. She says she never had much urine coming out.   Followed closely by Dr [name] and was last evaluated 03/04/2018. At that time entesto ordered but not started due to elevated creatinine. Creatinine was 4.3. She was instructed to hold lasix and go to the ED.    Presented [facility] ED on 03/05/09 with increased fatigue and dizziness. FOBT + . INR was >10. Received Kcentra.  Creatinine on admit was 4.43. Cardiology, GI, and Urology consulted. Coumadin was stopped.  Started on heparin drip.Foley was placed for urinary retention with > 6 liters out. She has been auto -diuresing. Weight has been trending down.   Denies SOB.   03/08/18 Renal US- resolution of hydronephrosis.  03/05/2018 Renal US- with bilateral hypdronephrosis R>L.   Echo 03/06/2018 EF 40-45% Personally reviewed   Review of Systems: [y] = yes, [ ]  = no   General:  Weight gain [ ] ; Weight loss [ ] ; Anorexia [ ] ; Fatigue [ ] ; Fever [ ] ; Chills [ ] ; Weakness [ Y]  Cardiac: Chest pain/pressure [ ] ; Resting SOB [ ] ; Exertional SOB [ ] ; Orthopnea [ ] ; Pedal Edema [ ] ; Palpitations [ ] ; Syncope [ ] ; Presyncope [ ] ; Paroxysmal nocturnal dyspnea[ ]   Pulmonary: Cough [ ] ; Wheezing[ ] ; Hemoptysis[ ] ; Sputum [ ] ; Snoring [ ]   GI: Vomiting[ ] ; Dysphagia[ ] ; Melena[ ] ; Hematochezia [ ] ; Heartburn[ ] ; Abdominal pain [ ] ; Constipation [ ] ; Diarrhea [ ] ; BRBPR [ ]   GU: Hematuria[ ] ; Dysuria [ ] ; Nocturia[ ]   Vascular: Pain in legs with walking [ ] ; Pain in feet with lying flat [ ] ; Non-healing sores [ ] ; Stroke [Y ]; TIA [ ] ; Slurred speech [ ] ;  Neuro: Headaches[ ] ; Vertigo[ ] ; Seizures[ ] ; Paresthesias[ ] ;Blurred vision [ ] ; Diplopia [ ] ; Vision changes [ ]   Ortho/Skin: Arthritis [ ] ; Joint pain [ ] ; Muscle pain [ ] ; Joint swelling [ ] ; Back Pain [ ] ; Rash [ ]   Psych: Depression[ ] ; Anxiety[ ]   Heme: Bleeding problems [ ] ; Clotting disorders [ ] ; Anemia [ ]   Endocrine: Diabetes [ ] ; Thyroid dysfunction[ ]   Home Medications Prior to Admission medications   Medication Sig Start Date End Date Taking? Authorizing Provider  acetaminophen (TYLENOL) 325 MG tablet Take 650 mg by mouth every 6 (six) hours as needed (for pain).    Yes [provider]  amiodarone (PACERONE) 200  MG tablet Take 1 tablet (200 mg total) TWICE a day for 2 weeks, then take 1 tablet (200 mg total) ONCE daily. 03/01/18  Yes Camnitz, Will Hassell Done, MD  carvedilol (COREG) 3.125 MG tablet Take 1 tablet (3.125 mg total) by mouth 2 (two) times daily. 07/09/17 03/05/18 Yes [name], MD  HYDROcodone-acetaminophen (NORCO/VICODIN) 5-325 MG tablet Take 1 tablet by mouth every 6 (six) hours as needed for pain. for pain 02/11/18  Yes [provider]  Investigational - Study Medication Take 1 tablet by mouth 2 (two) times daily. Study name: Galactic HF Study  Additional study details: Omecamtiv  Mecarbil or Placebo 09/17/17  Yes Larey Dresser, MD  isosorbide-hydrALAZINE (BIDIL) 20-37.5 MG tablet Take 1 tablet by mouth 3 (three) times daily. 10/20/17  Yes [name], MD  ranolazine (RANEXA) 500 MG 12 hr tablet Take 1 tablet (500 mg total) by mouth 2 (two) times daily. 09/14/17  Yes Francille Wittmann, Shaune Pascal, MD  spironolactone (ALDACTONE) 25 MG tablet Take 25 mg by mouth daily. 03/04/18  Yes [provider]  warfarin (COUMADIN) 3 MG tablet Take 1 tablet (3 mg total) by mouth daily. Patient taking differently: Take 3 mg by mouth one time only at 6 PM.  03/01/18  Yes [name], Denice Bors, MD    Past Medical History: Past Medical History:  Diagnosis Date  . Arthritis   . Atrial fibrillation ([place])    a. s/p multiple cardioversions; failed tikosyn/sotalol.  Marland Kitchen CAD in native artery, s/p cardiac cath with non obstructive CAD 10/24/2016  . CHF (congestive heart failure) ([place])   . CKD (chronic kidney disease) stage 3, GFR 30-59 ml/min (H[facility])   . DIVERTICULITIS, HX OF 07/25/2007  . DIVERTICULOSIS, COLON 07/22/2007  . Dyspnea   . Edema, peripheral    a. chronic BLE edema, R>L. Prior trauma from dog attack and accident.  Marland Kitchen HYPERLIPIDEMIA 02/03/2008  . Hypersomnia    declines w/u  . Hypertension   . MENOPAUSAL DISORDER 01/09/2011  . Morbid obesity ([place]) 07/22/2007  . NICM (nonischemic cardiomyopathy) ([place]) 10/24/2016  . Raynaud's syndrome 07/22/2007  . Stroke ([place]) 2017  . THYROID NODULE, RIGHT 01/04/2010  . VITAMIN D DEFICIENCY 01/09/2011   Qualifier: Diagnosis of  By: [name], [name]     Past Surgical History: Past Surgical History:  Procedure Laterality Date  . CARDIAC CATHETERIZATION N/A 10/23/2016   Procedure: Left Heart Cath and Coronary Angiography;  Surgeon: [name], MD;  Location: [place] [facility];  Service: Cardiovascular;  Laterality: N/A;  . CARDIOVERSION N/A 10/31/2016   Procedure: CARDIOVERSION;  Surgeon: [name], MD;  Location: [place];  Service:  Cardiovascular;  Laterality: N/A;  . CARDIOVERSION N/A 11/03/2016   Procedure: CARDIOVERSION;  Surgeon: [name], MD;  Location: [place];  Service: Cardiovascular;  Laterality: N/A;  . CARDIOVERSION N/A 11/18/2016   Procedure: CARDIOVERSION;  Surgeon: [name], MD;  Location: [place];  Service: Cardiovascular;  Laterality: N/A;  . CARDIOVERSION N/A 03/25/2017   Procedure: CARDIOVERSION;  Surgeon: [name], MD;  Location[facility] ENDOSCOPY;  Service: Cardiovascular;  Laterality: N/A;  . CARDIOVERSION N/A 05/06/2017   Procedure: CARDIOVERSION;  Surgeon: [name], MD;  Location: [facility] ENDOSCOPY;  Service: Cardiovascular;  Laterality: N/A;  . CARDIOVERSION N/A 05/12/2017   Procedure: CARDIOVERSION;  Surgeon: [name], MD;  Location: [facility] ENDOSCOPY;  Service: Cardiovascular;  Laterality: N/A;  . COLONOSCOPY N/A 12/04/2017   Procedure: COLONOSCOPY;  Surgeon: [name], MD;  Location: [facility] ENDOSCOPY;  Service: Endoscopy;  Laterality: N/A;  . COLONOSCOPY W/ POLYPECTOMY  02/2011   pan diverticulosis.  tubular adenoma without dysplasia on 5 mm sigmoid polyp.  Dr Fuller Plan.    Marland Kitchen PARTIAL HYSTERECTOMY     1 OVARY LEFT  . TEE WITHOUT CARDIOVERSION N/A 10/31/2016   Procedure: TRANSESOPHAGEAL ECHOCARDIOGRAM (TEE);  Surgeon: [name], MD;  Location: [facility] ENDOSCOPY;  Service: Cardiovascular;  Laterality: N/A;  . TEE WITHOUT CARDIOVERSION N/A 03/25/2017   Procedure: TRANSESOPHAGEAL ECHOCARDIOGRAM (TEE);  Surgeon: [name], MD;  Location: [facility] ENDOSCOPY;  Service: Cardiovascular;  Laterality: N/A;    Family History: Family History  Problem Relation Age of Onset  . Asthma Mother   . Diabetes Father   . Heart disease Father        Died of presumed heart attack - 3s  . Lung disease Sister   . Heart disease Sister        Twin sister has heart issue, unclear what kind  . Thyroid disease Neg Hx     Social History: Social History   Socioeconomic History  .  Marital status: Single    Spouse name: Not on file  . Number of children: 1  . Years of education: Not on file  . Highest education level: Not on file  Occupational History  . Occupation: Engineer, manufacturing  . Financial resource strain: Not on file  . Food insecurity:    Worry: Not on file    Inability: Not on file  . Transportation needs:    Medical: Not on file    Non-medical: Not on file  Tobacco Use  . Smoking status: Former Smoker    Packs/day: 0.50    Types: Cigarettes    Last attempt to quit: 10/23/2016    Years since quitting: 1.3  . Smokeless tobacco: Never Used  . Tobacco comment: Smoked for 50 years  Substance and Sexual Activity  . Alcohol use: No    Alcohol/week: 2.4 oz    Types: 4 Cans of beer per week    Frequency: Never    Comment: Intermittent  . Drug use: No  . Sexual activity: Not on file  Lifestyle  . Physical activity:    Days per week: Not on file    Minutes per session: Not on file  . Stress: Not on file  Relationships  . Social connections:    Talks on phone: Not on file    Gets together: Not on file    Attends religious service: Not on file    Active member of club or organization: Not on file    Attends meetings of clubs or organizations: Not on file    Relationship status: Not on file  Other Topics Concern  . Not on file  Social History Narrative  . Not on file    Allergies:  Allergies  Allergen Reactions  . Ace Inhibitors Palpitations    Objective:    Vital Signs:   Temp:  [98 F (36.7 C)-98.5 F (36.9 C)] 98.3 F (36.8 C) (04/15 1238) Pulse Rate:  [51-92] 60 (04/15 1238) Resp:  [16-18] 16 (04/15 1238) BP: (110-148)/(60-83) 114/60 (04/15 1238) SpO2:  [98 %-100 %] 98 % (04/15 1238) Weight:  [231 lb 4.2 oz (104.9 kg)] 231 lb 4.2 oz (104.9 kg) (04/14 1955) Last BM Date: 03/07/18  Weight change: Filed Weights   03/06/18 1700 03/07/18 1955  Weight: 239 lb 6.7 oz (108.6 kg) 231 lb 4.2 oz (104.9  kg)     Intake/Output:   Intake/Output Summary (Last 24 hours) at 03/08/2018 1322 Last data filed at 03/08/2018 0800 Gross per 24 hour  Intake 1420.92 ml  Output 5075 ml  Net -3654.08 ml      Physical Exam    General:  Well appearing. No resp difficulty HEENT: normal Neck: supple. JVP 5-6 . Carotids 2+ bilat; no bruits. No lymphadenopathy or thyromegaly appreciated. Cor: PMI nondisplaced. Regular rate & rhythm. No rubs, gallops or murmurs. Lungs: clear Abdomen: soft, nontender, nondistended. No hepatosplenomegaly. No bruits or masses. Good bowel sounds. Extremities: no cyanosis, clubbing, rash, edema Neuro: alert & orientedx3, cranial nerves grossly intact. moves all 4 extremities w/o difficulty. Affect pleasant GU: Foley with clear urine.    Telemetry   SInus Brady 50s   EKG    N/A  Labs   Basic Metabolic Panel: Recent Labs  Lab 03/04/18 1512 03/05/18 1454 03/06/18 0214 03/06/18 0610 03/07/18 0703 03/08/18 0301  NA 140 140  --  142 143 144  K 3.8 4.2  --  3.7 3.8 3.5  CL 108 110  --  115* 112* 112*  CO2 21* 19*  --  17* 22 25  GLUCOSE 105* 99  --  84 103* 100*  BUN 98* 120*  --  117* 81* 51*  CREATININE 4.30* 4.43*  --  4.28* 2.90* 2.14*  CALCIUM 8.8* 8.7*  --  8.6* 8.5* 7.9*  MG  --   --  2.0  --  1.8 1.5*  PHOS  --   --  3.8  --  3.2 2.7    Liver Function Tests: Recent Labs  Lab 03/05/18 1454 03/06/18 0610 03/07/18 0703 03/08/18 0301  AST 12* 15 18 13*  ALT 12* 13* 16 14  ALKPHOS 34* 33* 39 31*  BILITOT 0.5 1.2 0.9 0.8  PROT 6.2* 5.6* 5.9* 5.3*  ALBUMIN 3.3* 3.1* 3.1* 2.8*   No results for input(s): LIPASE, AMYLASE in the last 168 hours. No results for input(s): AMMONIA in the last 168 hours.  CBC: Recent Labs  Lab 03/05/18 1454 03/05/18 2329 03/06/18 0214  03/06/18 1240 03/07/18 0030 03/07/18 0703 03/07/18 1250 03/08/18 0301  WBC 6.6 6.3 6.6  --   --   --  7.2  --  5.9  NEUTROABS  --   --  4.8  --   --   --  5.5  --  4.2  HGB 9.3*  9.2* 9.6*   < > 9.1* 8.7* 9.7* 9.5* 8.5*  HCT 28.8* 28.5* 29.2*   < > 27.9* 26.9* 29.9* 30.3* 26.1*  MCV 91.4 88.8 89.8  --   --   --  90.6  --  91.3  PLT 146* 123* 126*  --   --   --  136*  --  127*   < > = values in this interval not displayed.    Cardiac Enzymes: No results for input(s): CKTOTAL, CKMB, CKMBINDEX, TROPONINI in the last 168 hours.  BNP: BNP (last 3 results) Recent Labs    04/29/17 0935  BNP 2,712.1*    ProBNP (last 3 results) No results for input(s): PROBNP in the last 8760 hours.   CBG: No results for input(s): GLUCAP in the last 168 hours.  Coagulation Studies: Recent Labs    03/05/18 1817 03/05/18 2329 03/06/18 0610 03/06/18 1240 03/06/18 2305  LABPROT 18.4* 16.5* 15.7* 14.8 14.6  INR 1.54 1.34 1.26 1.17 1.15     Imaging   US Renal  Result  Date: 03/08/2018 CLINICAL DATA:  Hydronephrosis. EXAM: RENAL / URINARY TRACT ULTRASOUND COMPLETE COMPARISON:  Renal ultrasound 03/05/2018. CT of the abdomen pelvis 12/02/2017. FINDINGS: Right Kidney: Length: 10.3 cm, within normal limits. This is decreased from the prior exam. Hydronephrosis has resolved. A cyst at the lower pole the right kidney measures 1.6 cm maximally. No other focal parenchymal lesions are present. Left Kidney: Length: 10.6 cm, within normal limits. Hydronephrosis has resolved. A cyst at the lower pole measures 4.2 cm maximally. No other parenchymal lesions are present. Bladder: A Foley catheter is in place. IMPRESSION: 1. Interval resolution of bilateral hydronephrosis. 2. Foley catheter in place. 3. Bilateral renal cysts. Electronically Signed   By: San Morelle M.D.   On: 03/08/2018 09:51      Medications:     Current Medications: . amiodarone  200 mg Oral Daily  . carvedilol  3.125 mg Oral BID WC  . isosorbide-hydrALAZINE  1 tablet Oral TID  . oxybutynin  5 mg Oral QHS  . [START ON 03/09/2018] pantoprazole  40 mg Oral Daily  . ranolazine  500 mg Oral BID  . sodium  chloride flush  3 mL Intravenous Q12H     Infusions: . heparin 1,100 Units/hr (03/07/18 1155)       Patient Profile   Patricia Wagner is a 68 year old with h/o chronic a fib, NICM, biventricular heart failure, HTN, and hyperlipidemia. Failed multiple cardioversions.   INR on admit >10.   Assessment/Plan   1. AKI on CKD Creatinine on admit > 4.  Creatinine now down to 2.1 off diuretics for now.   2. GI Bleed FOBT +  GI consulted. No intervention at this time. Thought to be from INR >10.  No further melena.   3. Chronic Systolic/Diastoic HF ECHo EF 40-45% Grade IDD Volume status appears stable.  Continue low dose carvedilol and bidil.  No spiro with elevated creatinine.   4. PAF In NSR. On heparin drip. Had CVA 2017 on eliquis. ? Consider xarelto.  INR on admit > 10.  Continue amio 200 mg daily.   5. . Urinary Retention Urology following. Foley cath in place.  Failed I/O caths at home.   6.  Bilateral Hydroneprosis 4/12 Renal US with bilateral hydronephrosis  4/15 Renal US hydronephrosis resolved.   7.  H/o CVA- 2017 non hemorrhagic on eliquis at that time. She was switched to coumadin.    H/O Stroke- she was on Medication concerns reviewed with patient and pharmacy team. Barriers identified: None   Length of Stay: 3  Amy Clegg, NP  03/08/2018, 1:22 PM  Advanced Heart Failure Team Pager [phone] (M-F; 7a - 4p)  Please contact [facility] for night-coverage after hours (4p -7a ) and weekends on [email or web address]  Patient seen and examined with Darrick Grinder, NP. We discussed all aspects of the encounter. I agree with the assessment and plan as stated above.    67 y/o woman well known to me from the HF clinic. She has PAF and h/o NICM related primarily tachy-induced CM when she has rapid AF. She has had several episodes of cardiogenic shock from this in the past. Most recently we have been able to maintain NSR with amiodarone and EF has now improved to 45-50% on recent  echo (viewed personally).   I saw her in the office last week and she had evidence of AKI. Referred to ER.  Found to have urinary retention with obstructive uropathy. INR also 10. Creatinine now improving. Volume status looks  ok on exam. Would continue to hold diuretics. Continue amiodarone. I think Xarelto would be reasonable option for AF now that INR down.   Glori Bickers, MD  10:12 PM

## 2018-03-08 NOTE — Progress Notes (Signed)
Patient's chart reviewed. Renal function significantly improved. Renal u/s today with resolution of hydronephrosis with foley. Recommend continuing foley catheter with outpatient f/u to discuss resuming CIC vs chronic foley. Please call with questions.

## 2018-03-08 NOTE — Progress Notes (Signed)
ANTICOAGULATION CONSULT NOTE - Follow Up Consult  Pharmacy Consult for Heparin Indication: atrial fibrillation  Allergies  Allergen Reactions  . Ace Inhibitors Palpitations    Patient Measurements: Height: 5\' 5"  (165.1 cm) Weight: 231 lb 4.2 oz (104.9 kg) IBW/kg (Calculated) : 57 Heparin Dosing Weight: 82 kg  Vital Signs: Temp: 98.3 F (36.8 C) (04/15 0838) Temp Source: Oral (04/15 0838) BP: 123/83 (04/15 0838) Pulse Rate: 51 (04/15 0838)  Labs: Recent Labs    03/06/18 0214 03/06/18 0610  03/06/18 1240 03/06/18 2305  03/07/18 0703 03/07/18 1250 03/07/18 1945 03/08/18 0301  HGB 9.6*  --    < > 9.1*  --    < > 9.7* 9.5*  --  8.5*  HCT 29.2*  --    < > 27.9*  --    < > 29.9* 30.3*  --  26.1*  PLT 126*  --   --   --   --   --  136*  --   --  127*  APTT  --  25  --   --   --   --   --   --   --   --   LABPROT  --  15.7*  --  14.8 14.6  --   --   --   --   --   INR  --  1.26  --  1.17 1.15  --   --   --   --   --   HEPARINUNFRC  --   --   --   --   --   --   --   --  0.31 0.34  CREATININE  --  4.28*  --   --   --   --  2.90*  --   --  2.14*   < > = values in this interval not displayed.    Estimated Creatinine Clearance: 31.9 mL/min (A) (by C-G formula based on SCr of 2.14 mg/dL (H)).   Medications:  Infusions:  . heparin 1,100 Units/hr (03/07/18 1155)  . magnesium sulfate 1 - 4 g bolus IVPB      Assessment: 67 year old female admitted with GI bleed and AKI.  She is on chronic anticoagulation with Warfarin for atrial fibrillation.  Her warfarin was reversed due to bleeding and she is now on Heparin.  We are targeting a lower therapeutic goal of 0.3-0.5 due to her GI bleed. Her heparin level remains stable within this goal.  No further bleeding is documented this morning. Her hemoglobin is down a little and her platelet count remains low but stable.  Goal of Therapy:  Heparin level 0.3-0.5 units/ml Monitor platelets by anticoagulation protocol: Yes   Plan:   Continue heparin at 1100 units/hr Check daily heparin level and CBC  Monitor for signs and symptoms of bleeding Follow for long-term anticoagulation plans  Legrand Como, Pharm.D., BCPS, BCIDP Clinical Pharmacist Phone: [phone] or [phone] 03/08/2018, 9:43 AM

## 2018-03-08 NOTE — Progress Notes (Signed)
PROGRESS NOTE    Patricia Wagner  GUR:[id number] DOB: 1951/07/03 DOA: 03/05/2018 PCP: Biagio Borg, MD   Brief Narrative:  The patient is a very pleasant 67 yo Morbidly Obese AAF with a PMH of Atrial Fibrillation s/p Multiple DCCV Cardioversions on Anticoagulation with Warfarin, NICM with Chronic Biventricular CHF, Hx of Diverticulosis, HTN, HLD, Hx of Urinary Retention and UTI, Raynaud's Syndrome, CKD Stage 3 with Baseline Cr of 1.3-1.5, Hx of Recurrent Cardiogenic Shock and other comorbidities who presented to [facility] ED because of Dark Tarry Stools that started this Am.  Upon discussion with the patient she saw her Primary Heart Failure Cardiologist [name] yesterday who evaluated her for her Heart Failure. She had blood work drawn at the office and which revealed a severe derangement in Her Kidneys. Of note she was found to be more fatigued and this AM when she bent over to tie her shoe became lightheaded.   She presented to [facility] ED and was found to have Elevated INR and FOBT Positive so she was given Kcentra, Vitamin K, and transfused 1 unit. She is being admitted for AKI on CKD Stage 3, Suspected Upper GIB in the setting of Coagulopathy from Coumadin Toxicity, Acute Exacerbation of Chronic Biventricular CHF.   Cardiology, Gastroenterology, and Urology Consulted for further evaluation and recommendations. GI recommending Miralax and Cardiology recommending Heparin gtt for A Fib and changing to focus of Xarelto when the renal function looked improved.  Urology recommended continuing Foley catheter and repeating a renal ultrasound in the morning.  Assessment & Plan:   Principal Problem:   Acute kidney injury ([place]) Active Problems:   CKD (chronic kidney disease) stage 3, GFR 30-59 ml/min (HCC)   Coagulopathy (HCC)   NICM (nonischemic cardiomyopathy) (HCC)   Chronic systolic (congestive) heart failure (HCC)   Chronic atrial fibrillation (HCC)   Hypertension   Acute blood  loss anemia   Diverticulosis   Heme positive stool   Rectal bleed  Acute kidney injury on CKD (chronic kidney disease) stage 3, GFR 30-59 ml/min, improving  -Patient has developed in the past 48 hours worsening of renal function in the context of preadmission utilization of Entresto and Lasix; suspected that occult/initial onset of GI bleeding with associated likely hypotension i.e. decreased renal perfusion also contributing but now feel like it is mostly obstructive Uropathy and Acute Urinary Retention  -Baseline renal function: 7/1.44 -BUN/Cr Improved from 120/4.43 -> 117/4.28 -> 81/2.90 -> 51/2.14 -Patient previously instructed to hold Entresto and Lasix; -Heart elevation in BUN over rapid timeframe without altered mentation more suggestive of acute process such as GI bleeding/azotemia -Bladder scan due to history of urinary retention showed >999 -Renal Ultrasound showed bilateral Hydro -Follow labs and avoid nephrotoxic medications -Treat underlying causes and placed Foley -Potentially acute systolic heart failure also contributing to acute renal dysfunction  -Urology Consulted and evaluated and recommending continue Foley at this time.  Dr. Junious Silk also recommends following up with Dr. Pilar Jarvis for CIC retraining versus continued Foley use versus suprapubic tube.  Dr. Junious Silk also recommended rechecking a renal ultrasound in 1-2 days;  -Repeat U/S added this AM showed interval resolution of bilateral hydronephrosis, Foley catheter in place, and bilateral renal cysts. -Urology Dr. Baruch Gouty pending continuing Foley catheter with outpatient follow-up to discuss resuming CIC versus chronic Foley  -She was having bladder spasms added Ditropan 5 mg nightly. -Continue to Monitor and Repeat CMP in AM   Coagulopathy/Thrombocytopenia -Patient presented with a therapeutic INR > 10.00 -Reversal  agents of vitamin K and K Centra given with pharmacy managing follow-up lab -Continue to hold  Warfarin; cardiology starting patient on heparin and considering transition to Eliquis or Xarelto once renal stabilizes. -Low platelets likely reflective of bone marrow consumption with acute evolving anemia -PT/INR improved and was 14.6/1.15; Platelet Count now 127K   Acute Blood Loss Anemia in the setting of Chronic Anemia  -Baseline hemoglobin around 8.4-9.6 current hemoglobin on admit was 9.3 but do not suspect patient has equilibrated since bleeding onset only several hours prior -Agree with EDP decision to give at least 1 unit PRBCs -Order placed for RN to obtain CBC after blood infused-may require additional PRBCs in the next 24 hours -Her Hemoccult positive and agree with GI consultation (see below) -Repeat HH went from 9.5/30.3 -> 8.5/26.1 -No back pain but could be experiencing spontaneous retroperitoneal hemorrhage continue to monitor-may require additional imaging  NICM (nonischemic cardiomyopathy)/Chronic Biventricular Heart Failure  -History of biventricular heart failure [name] diagnosed May 2018 noting EF at that time 15% with improvement to 30-35% on follow-up echo -Echocardiogram August 2018: Severe concentric LVH with an EF of 30-35% -Has significant bilateral lower extremity edema and weight is up to 242 lbss from dry weight reported at 220 lbs -Given acute kidney injury and relative hypotension all home heart failure meds have been placed on hold (Lasix, Entresto and carvedilol) -Cardiology Consulted and appreciate Evaluation -Daily weights, strict I's/O -Patient is -9.719 Liters (autodiuresing) and Weight is 239 lbs (Not done today) -Repeat echocardiogram this admission showed EF of 40-45% and Grade 1 Diastolic Dysfunction  -Currently maintaining sinus rhythm-previous heart failure felt to be tachycardia mediated in the context of atrial fibrillation with RVR -Cardiology evaluated and recommend holding Lasix for now; Cardiology is starting carvedilol and BiDil back  today.  Chronic Atrial Fibrillation  -Maintaining sinus rhythm on Amiodarone- -Warfarin on hold in context of acute GI bleeding and will be discontinued per cardiology.  They will start heparin drip and consider Eliquis or Xarelto once her renal function improves. -CHA2DS2-VASc Score and unadjusted Ischemic Stroke Rate (% per year) is equal to 3.2 % stroke rate/year from a score of 3 -Pharmacy consulted for heparin drip dosing. -Cardiology Restarting Ranexa   Diverticulosis/Heme positive stool and GIB in the setting of Coagulapathy  -Patient with known diverticulosis based on prior CT the abdomen -Reports history of prior GI bleeding -Colonoscopy 12/04/17 revealed normal exam noting diverticulosis of the large intestine without perforation or abscess signs of active bleeding -Upper abdominal pain therefore bleeding could be upper GI or small bowel related -Continue Protonix 40 mg IV every Daily and change to p.o. -Formal GI consultation done they recommending conservative management and have no plans for endoscopy at present unless she has further active bleeding. They feel rectal bleeding was related to Constipation. -Dr. Lyndel Safe does recommending MiraLAX 17 g once a day for symptomatic judgment of constipation.  He also recommends Preparation H or Anusol suppositories if there is any further recurrent bleeding and recommends that anticoagulation can be restarted from a GI standpoint. -Stop HH Labs -Repeat CBC in AM   Hypertension -Blood pressure this a.m. was 114/60 -Cardiology restarting carvedilol 3.125 mg twice daily and Isosorbide-Hydralazine 1 tablet p.o. 3 times daily -Continue to Monitor  [place]act[place], Present on Admission -Patient is afebrile, no white count, and only complained of lower abdominal pain.  She denied any burning, or dysuria, or urinary frequency. -Urinalysis showed Hazy Urine, Small Leukocytes, Positive Nitrites, Rare Bacteria and 0-5 WBC -Urine Cx  showed >100,000 CFU  of Staphylococcus Species with Sensitivities to Gentamycin, Inducible Clindamycin, Macrobid, Rifampin, Tetracycline, and Vancomycin.  -It is felt that patient does not have an Active UTI given no symptoms, fever, white count. If patient does become symptomatic will treat   Hypomagnesemia -Patient's Mag Level was 1.5 this AM -Replete with IV Mag Sulfate 2 grams -Continue to Monitor and Replete as Necessary -Repeat Mag Level in AM   DVT prophylaxis: SCDs; Cardiology started patient on Heparin gtt and will continue; Defer to them to Start Eliquis vs. Xarelto  Code Status: FULL CODE  Family Communication: No family present at bedside  Disposition Plan: Remain Inpatient for continued work up and obtain PT/OT Evaluation  Consultants:   Gastroenterology  Cardiology  Urology   Procedures:    Antimicrobials:  Anti-infectives (From admission, onward)   None     Subjective: Seen and examined this AM was feeling better.  Denied any chest pain, shortness breath, nausea, vomiting.  States that her IV started bleeding and took some time to stop.  Objective: Vitals:   03/07/18 2200 03/08/18 0100 03/08/18 0400 03/08/18 0715  BP: 132/70 129/74 (!) 148/73 135/73  Pulse:  92    Resp:   18   Temp: 98 F (36.7 C) 98.5 F (36.9 C) 98.2 F (36.8 C)   TempSrc:  Oral    SpO2: 99% 99% 100%   Weight:      Height:        Intake/Output Summary (Last 24 hours) at 03/08/2018 0801 Last data filed at 03/08/2018 0500 Gross per 24 hour  Intake 1507.92 ml  Output 4125 ml  Net -2617.08 ml   Filed Weights   03/06/18 1700 03/07/18 1955  Weight: 108.6 kg (239 lb 6.7 oz) 104.9 kg (231 lb 4.2 oz)   Examination: Physical Exam:  Constitutional: Well-nourished, well-developed obese African-American female who is currently in no acute distress  Eyes: Sclera anicteric.  Conjunctiva noninjected. ENMT: External ears and nose appear normal.  Grossly normal hearing mucous membranes moist. Neck: Supple  with no JVD Respiratory: Diminished to auscultation.  Unlabored breathing and no appreciable wheezing, rales, rhonchi. Cardiovascular: Regular rate and rhythm.  No appreciable murmurs, rubs, or gallops.  1+ lower extremity edema noted Abdomen: Soft, slightly tender in the lower abdomen.  Distended secondary to body habitus.  Bowel sounds present all 4 quadrants GU: Deferred.  Patient has a Foley catheter in place draining yellow colored urine Musculoskeletal: No contractures, no clubbing no cyanosis. Skin: Still has some bruising to the right shoulder and lower extremity bilaterally.  Had a bleeding IV site on the right arm now covered.  No appreciable rashes noted.   Neurologic: Cranial nerves II through XII grossly intact with no appreciable focal deficits Psychiatric: Normal mood and affect.  Intact judgment and insight.  Awake and alert and oriented x3.    Data Reviewed: I have personally reviewed following labs and imaging studies  CBC: Recent Labs  Lab 03/05/18 1454 03/05/18 2329 03/06/18 0214  03/06/18 1240 03/07/18 0030 03/07/18 0703 03/07/18 1250 03/08/18 0301  WBC 6.6 6.3 6.6  --   --   --  7.2  --  5.9  NEUTROABS  --   --  4.8  --   --   --  5.5  --  4.2  HGB 9.3* 9.2* 9.6*   < > 9.1* 8.7* 9.7* 9.5* 8.5*  HCT 28.8* 28.5* 29.2*   < > 27.9* 26.9* 29.9* 30.3* 26.1*  MCV 91.4 88.8 89.8  --   --   --  90.6  --  91.3  PLT 146* 123* 126*  --   --   --  136*  --  127*   < > = values in this interval not displayed.   Basic Metabolic Panel: Recent Labs  Lab 03/04/18 1512 03/05/18 1454 03/06/18 0214 03/06/18 0610 03/07/18 0703 03/08/18 0301  NA 140 140  --  142 143 144  K 3.8 4.2  --  3.7 3.8 3.5  CL 108 110  --  115* 112* 112*  CO2 21* 19*  --  17* 22 25  GLUCOSE 105* 99  --  84 103* 100*  BUN 98* 120*  --  117* 81* 51*  CREATININE 4.30* 4.43*  --  4.28* 2.90* 2.14*  CALCIUM 8.8* 8.7*  --  8.6* 8.5* 7.9*  MG  --   --  2.0  --  1.8 1.5*  PHOS  --   --  3.8  --  3.2  2.7   GFR: Estimated Creatinine Clearance: 31.9 mL/min (A) (by C-G formula based on SCr of 2.14 mg/dL (H)). Liver Function Tests: Recent Labs  Lab 03/05/18 1454 03/06/18 0610 03/07/18 0703 03/08/18 0301  AST 12* 15 18 13*  ALT 12* 13* 16 14  ALKPHOS 34* 33* 39 31*  BILITOT 0.5 1.2 0.9 0.8  PROT 6.2* 5.6* 5.9* 5.3*  ALBUMIN 3.3* 3.1* 3.1* 2.8*   No results for input(s): LIPASE, AMYLASE in the last 168 hours. No results for input(s): AMMONIA in the last 168 hours. Coagulation Profile: Recent Labs  Lab 03/05/18 1817 03/05/18 2329 03/06/18 0610 03/06/18 1240 03/06/18 2305  INR 1.54 1.34 1.26 1.17 1.15   Cardiac Enzymes: No results for input(s): CKTOTAL, CKMB, CKMBINDEX, TROPONINI in the last 168 hours. BNP (last 3 results) No results for input(s): PROBNP in the last 8760 hours. HbA1C: No results for input(s): HGBA1C in the last 72 hours. CBG: No results for input(s): GLUCAP in the last 168 hours. Lipid Profile: No results for input(s): CHOL, HDL, LDLCALC, TRIG, CHOLHDL, LDLDIRECT in the last 72 hours. Thyroid Function Tests: No results for input(s): TSH, T4TOTAL, FREET4, T3FREE, THYROIDAB in the last 72 hours. Anemia Panel: No results for input(s): VITAMINB12, FOLATE, FERRITIN, TIBC, IRON, RETICCTPCT in the last 72 hours. Sepsis Labs: No results for input(s): PROCALCITON, LATICACIDVEN in the last 168 hours.  Recent Results (from the past 240 hour(s))  Culture, Urine     Status: Abnormal (Preliminary result)   Collection Time: 03/06/18 12:29 AM  Result Value Ref Range Status   Specimen Description URINE, CATHETERIZED  Final   Special Requests NONE  Final   Culture (A)  Final    >=100,000 COLONIES/mL STAPHYLOCOCCUS SPECIES (COAGULASE NEGATIVE) SUSCEPTIBILITIES TO FOLLOW Performed at [facility], [address] [place], [place] [address]    Report Status PENDING  Incomplete  MRSA PCR Screening     Status: None   Collection Time: 03/06/18  4:08 PM  Result  Value Ref Range Status   MRSA by PCR NEGATIVE NEGATIVE Final    Comment:        The GeneXpert MRSA Assay (FDA approved for NASAL specimens only), is one component of a comprehensive MRSA colonization surveillance program. It is not intended to diagnose MRSA infection nor to guide or monitor treatment for MRSA infections. Performed at [facility], [place] [address] [place], [place] [address]     Radiology Studies: No results found. Scheduled Meds: . amiodarone  200 mg Oral Daily  . carvedilol  3.125 mg Oral BID WC  . isosorbide-hydrALAZINE  1 tablet Oral TID  . oxybutynin  5 mg Oral QHS  . pantoprazole (PROTONIX) IV  40 mg Intravenous Q24H  . ranolazine  500 mg Oral BID  . sodium chloride flush  3 mL Intravenous Q12H   Continuous Infusions: . heparin 1,100 Units/hr (03/07/18 1155)    LOS: 3 days   Kerney Elbe, DO [facility] Pager [phone]  If 7PM-7AM, please contact night-coverage [email or web address] Password [email or web address] 03/08/2018, 8:01 AM

## 2018-03-09 DIAGNOSIS — I480 Paroxysmal atrial fibrillation: Secondary | ICD-10-CM

## 2018-03-09 LAB — COMPREHENSIVE METABOLIC PANEL
ALK PHOS: 31 U/L — AB (ref 38–126)
ALT: 13 U/L — AB (ref 14–54)
AST: 14 U/L — ABNORMAL LOW (ref 15–41)
Albumin: 2.8 g/dL — ABNORMAL LOW (ref 3.5–5.0)
Anion gap: 6 (ref 5–15)
BILIRUBIN TOTAL: 0.5 mg/dL (ref 0.3–1.2)
BUN: 33 mg/dL — ABNORMAL HIGH (ref 6–20)
CALCIUM: 7.9 mg/dL — AB (ref 8.9–10.3)
CO2: 26 mmol/L (ref 22–32)
CREATININE: 1.67 mg/dL — AB (ref 0.44–1.00)
Chloride: 109 mmol/L (ref 101–111)
GFR, EST AFRICAN AMERICAN: 36 mL/min — AB (ref >60)
GFR, EST NON AFRICAN AMERICAN: 31 mL/min — AB (ref >60)
Glucose, Bld: 100 mg/dL — ABNORMAL HIGH (ref 65–99)
Potassium: 3.7 mmol/L (ref 3.5–5.1)
SODIUM: 141 mmol/L (ref 135–145)
TOTAL PROTEIN: 5.3 g/dL — AB (ref 6.5–8.1)

## 2018-03-09 LAB — CBC WITH DIFFERENTIAL/PLATELET
Basophils Absolute: 0 10*3/uL (ref 0.0–0.1)
Basophils Relative: 0 %
Eosinophils Absolute: 0.3 10*3/uL (ref 0.0–0.7)
Eosinophils Relative: 5 %
HCT: 27.8 % — ABNORMAL LOW (ref 36.0–46.0)
HEMOGLOBIN: 8.4 g/dL — AB (ref 12.0–15.0)
LYMPHS ABS: 1.3 10*3/uL (ref 0.7–4.0)
LYMPHS PCT: 20 %
MCH: 28 pg (ref 26.0–34.0)
MCHC: 30.2 g/dL (ref 30.0–36.0)
MCV: 92.7 fL (ref 78.0–100.0)
MONOS PCT: 7 %
Monocytes Absolute: 0.5 10*3/uL (ref 0.1–1.0)
NEUTROS PCT: 68 %
Neutro Abs: 4.5 10*3/uL (ref 1.7–7.7)
Platelets: 124 10*3/uL — ABNORMAL LOW (ref 150–400)
RBC: 3 MIL/uL — AB (ref 3.87–5.11)
RDW: 16.5 % — ABNORMAL HIGH (ref 11.5–15.5)
WBC: 6.6 10*3/uL (ref 4.0–10.5)

## 2018-03-09 LAB — HEPARIN LEVEL (UNFRACTIONATED): Heparin Unfractionated: 0.41 IU/mL (ref 0.30–0.70)

## 2018-03-09 LAB — MAGNESIUM: Magnesium: 1.7 mg/dL (ref 1.7–2.4)

## 2018-03-09 LAB — PHOSPHORUS: PHOSPHORUS: 1.9 mg/dL — AB (ref 2.5–4.6)

## 2018-03-09 MED ORDER — MAGNESIUM SULFATE IN D5W 1-5 GM/100ML-% IV SOLN
1.0000 g | Freq: Once | INTRAVENOUS | Status: AC
  Administered 2018-03-09: 1 g via INTRAVENOUS
  Filled 2018-03-09: qty 100

## 2018-03-09 MED ORDER — PHOSPHA 250 NEUTRAL 155-852-130 MG PO TABS
500.0000 mg | ORAL_TABLET | Freq: Three times a day (TID) | ORAL | Status: AC
  Administered 2018-03-09 (×3): 500 mg via ORAL
  Filled 2018-03-09 (×3): qty 2

## 2018-03-09 MED ORDER — RIVAROXABAN 20 MG PO TABS
20.0000 mg | ORAL_TABLET | Freq: Every day | ORAL | Status: DC
  Administered 2018-03-09: 20 mg via ORAL
  Filled 2018-03-09: qty 1

## 2018-03-09 NOTE — Evaluation (Signed)
Physical Therapy Evaluation Patient Details Name: Patricia Wagner MRN: [id number] DOB: 1951-10-27 Today's Date: 03/09/2018   History of Present Illness  Patricia Wagner is a 67 y.o. female with medical history significant for atrial fibrillation on chronic anticoagulation with warfarin, history of chronic atrial fibrillation maintaining sinus rhythm on amiodarone, nonischemic cardiomyopathy with severe concentric LVH with initial echocardiogram when diagnosed EF 15% now EF 30-35% on echo August 2018, known diverticulosis on CT, history of GI bleed on warfarin, chronic kidney disease stage III, hypertension, arthritis, obesity. Pt admitted with dark tary stools.  Clinical Impression  Pt admitted with above. Pt functioning near baseline. Pt with noted dec SPO2 into 70s on RA during ambulation however pt states "i'm not taking oxygen home, I feel fine. I"m fine at home." Acute PT to monitor pt.    Follow Up Recommendations No PT follow up    Equipment Recommendations  None recommended by PT    Recommendations for Other Services       Precautions / Restrictions Precautions Precautions: Fall Restrictions Weight Bearing Restrictions: No      Mobility  Bed Mobility Overal bed mobility: Modified Independent             General bed mobility comments: HOB elevated, no physical assist  Transfers Overall transfer level: Modified independent Equipment used: Straight cane Transfers: Sit to/from Stand Sit to Stand: Modified independent (Device/Increase time)         General transfer comment: no physical assist needed  Ambulation/Gait Ambulation/Gait assistance: Modified independent (Device/Increase time) Ambulation Distance (Feet): 175 Feet Assistive device: Straight cane Gait Pattern/deviations: Step-through pattern Gait velocity: wfl Gait velocity interpretation: >2.62 ft/sec, indicative of community ambulatory General Gait Details: pt with good sequencing of cane  Stairs             Wheelchair Mobility    Modified Rankin (Stroke Patients Only)       Balance Overall balance assessment: Modified Independent                                           Pertinent Vitals/Pain Pain Assessment: No/denies pain    Home Living Family/patient expects to be discharged to:: Private residence Living Arrangements: Alone Available Help at Discharge: Family;Available PRN/intermittently Type of Home: Apartment Home Access: Level entry     Home Layout: One level Home Equipment: Cane - single point      Prior Function Level of Independence: Independent         Comments: uses cane due to "bad L knee"     Hand Dominance   Dominant Hand: Right    Extremity/Trunk Assessment   Upper Extremity Assessment Upper Extremity Assessment: Overall WFL for tasks assessed    Lower Extremity Assessment Lower Extremity Assessment: Generalized weakness    Cervical / Trunk Assessment Cervical / Trunk Assessment: Normal  Communication   Communication: No difficulties  Cognition Arousal/Alertness: Awake/alert Behavior During Therapy: WFL for tasks assessed/performed Overall Cognitive Status: Within Functional Limits for tasks assessed                                        General Comments General comments (skin integrity, edema, etc.): Pt reports "i am not taking Oxygen home" Pt SPO2 into 70s on RA during ambulation however pt states "i'm not  SOB, I"m fine" "I am not taking oxygen home".    Exercises     Assessment/Plan    PT Assessment Patient needs continued PT services  PT Problem List Decreased activity tolerance;Decreased balance;Decreased mobility       PT Treatment Interventions DME instruction;Gait training;Stair training;Functional mobility training;Therapeutic activities;Therapeutic exercise;Balance training    PT Goals (Current goals can be found in the Care Plan section)  Acute Rehab PT Goals Patient  Stated Goal: home PT Goal Formulation: With patient Time For Goal Achievement: 03/16/18 Potential to Achieve Goals: Good    Frequency Min 2X/week   Barriers to discharge        Co-evaluation               AM-PAC PT "6 Clicks" Daily Activity  Outcome Measure Difficulty turning over in bed (including adjusting bedclothes, sheets and blankets)?: None Difficulty moving from lying on back to sitting on the side of the bed? : None Difficulty sitting down on and standing up from a chair with arms (e.g., wheelchair, bedside commode, etc,.)?: None Help needed moving to and from a bed to chair (including a wheelchair)?: None Help needed walking in hospital room?: None Help needed climbing 3-5 steps with a railing? : A Little 6 Click Score: 23    End of Session Equipment Utilized During Treatment: Gait belt Activity Tolerance: Patient tolerated treatment well Patient left: in bed;with call bell/phone within reach Nurse Communication: Mobility status PT Visit Diagnosis: Unsteadiness on feet (R26.81)    Time: 9702-6378 PT Time Calculation (min) (ACUTE ONLY): 18 min   Charges:   PT Evaluation $PT Eval Low Complexity: 1 Low     PT G CodesKittie Plater, PT, DPT Pager #: [phone] Office #: [phone]   Jonanthony Nahar M Phat [place] 03/09/2018, 2:42 PM

## 2018-03-09 NOTE — Progress Notes (Signed)
Attempted to wean patient from [place] to RA. On RA patient desaturated down into the 80's. Replaced [place] at 2L O2 sat noted 96%. Patient is adamant that she isn't going home with oxygen. VS currently stable will continue to monitor.

## 2018-03-09 NOTE — Progress Notes (Signed)
ANTICOAGULATION CONSULT NOTE - Follow Up Consult  Pharmacy Consult for Heparin >>Xarelto Indication: atrial fibrillation  Allergies  Allergen Reactions  . Ace Inhibitors Palpitations    Patient Measurements: Height: 5\' 5"  (165.1 cm) Weight: 226 lb 3.1 oz (102.6 kg) IBW/kg (Calculated) : 57 Heparin Dosing Weight: 82 kg  Vital Signs: Temp: 98.3 F (36.8 C) (04/15 2122) Temp Source: Oral (04/15 2122) BP: 146/75 (04/16 0729) Pulse Rate: 64 (04/16 0729)  Labs: Recent Labs    03/06/18 1240 03/06/18 2305  03/07/18 0703 03/07/18 1250 03/07/18 1945 03/08/18 0301 03/09/18 0227  HGB 9.1*  --    < > 9.7* 9.5*  --  8.5* 8.4*  HCT 27.9*  --    < > 29.9* 30.3*  --  26.1* 27.8*  PLT  --   --   --  136*  --   --  127* 124*  LABPROT 14.8 14.6  --   --   --   --   --   --   INR 1.17 1.15  --   --   --   --   --   --   HEPARINUNFRC  --   --   --   --   --  0.31 0.34 0.41  CREATININE  --   --   --  2.90*  --   --  2.14* 1.67*   < > = values in this interval not displayed.    Estimated Creatinine Clearance: 40.4 mL/min (A) (by C-G formula based on SCr of 1.67 mg/dL (H)).   Medications:  Infusions:  . heparin 1,100 Units/hr (03/08/18 2133)  . magnesium sulfate 1 - 4 g bolus IVPB      Assessment: 67 year old female admitted with GI bleed and AKI.  She is on chronic anticoagulation with Warfarin for atrial fibrillation.  Her warfarin was reversed due to bleeding and she is now on Heparin.  We are targeting a lower therapeutic goal of 0.3-0.5 due to her GI bleed.   Heparin level remains stable within goal range at 0.41, on 1100 units/hr.  No further bleeding is documented. Hgb is stable at 8.4, plts 124. No infusion rate issues.  Pharmacy consulted to change to Xarelto from heparin infusion. Scr is 1.67 (TBW CrCl 54 mL/min).   Goal of Therapy:  Heparin level 0.3-0.5 units/ml Monitor platelets by anticoagulation protocol: Yes   Plan:  Discontinue heparin infusion Start Xarelto  20 mg at time of heparin discontinuation  Monitor CBC, s/sx of bleeding   Doylene Canard, PharmD Clinical Pharmacist  Pager: [phone] Clinical Phone for 03/09/2018 until 3:30pm: [phone] If after 3:30pm, please call main pharmacy at [phone] 03/09/2018, 8:01 AM

## 2018-03-09 NOTE — Progress Notes (Addendum)
Advanced Heart Failure Rounding Note  PCP-Cardiologist: No primary care provider on file.   Subjective:     Remains off diuretics. Feeling much better. No CP or SOB. Foley in place draining clear urine.   Creatinine continues to trend down to 1.6.   Denies SOB, orthopnea or PND. Remains in NSR.    Objective:   Weight Range: 226 lb 3.1 oz (102.6 kg) Body mass index is 37.64 kg/m.   Vital Signs:   Temp:  [98.2 F (36.8 C)-99.2 F (37.3 C)] 98.2 F (36.8 C) (04/16 1200) Pulse Rate:  [51-64] 51 (04/16 1200) Resp:  [11-20] 18 (04/16 1200) BP: (115-146)/(59-75) 146/75 (04/16 0729) SpO2:  [98 %-100 %] 100 % (04/16 1200) Weight:  [226 lb 3.1 oz (102.6 kg)-226 lb 6.4 oz (102.7 kg)] 226 lb 3.1 oz (102.6 kg) (04/16 0500) Last BM Date: 03/07/18  Weight change: Filed Weights   03/07/18 1955 03/08/18 1827 03/09/18 0500  Weight: 231 lb 4.2 oz (104.9 kg) 226 lb 6.4 oz (102.7 kg) 226 lb 3.1 oz (102.6 kg)    Intake/Output:   Intake/Output Summary (Last 24 hours) at 03/09/2018 1420 Last data filed at 03/09/2018 1008 Gross per 24 hour  Intake 1157.57 ml  Output 2025 ml  Net -867.43 ml      Physical Exam    General:   No resp difficulty HEENT: Normal anicteric  Neck: Supple. JVP 5-6 . Carotids 2+ bilat; no bruits. No lymphadenopathy or thyromegaly appreciated. Cor: PMI nondisplaced. Regular rate & rhythm. No rubs, gallops or murmurs. Lungs: Clear no wheeze Abdomen: Soft, nontender, nondistended. No hepatosplenomegaly. No bruits or masses. Good bowel sounds. Extremities: No cyanosis, clubbing, rash, trace edema war  Neuro: alert & oriented x 3, cranial nerves grossly intact. moves all 4 extremities w/o difficulty. Affect pleasant GU: foley    Telemetry   Sinus Loletha Grayer 50s Personally reviewed   EKG    N/A  Labs    CBC Recent Labs    03/08/18 0301 03/09/18 0227  WBC 5.9 6.6  NEUTROABS 4.2 4.5  HGB 8.5* 8.4*  HCT 26.1* 27.8*  MCV 91.3 92.7  PLT 127* 124*    Basic Metabolic Panel Recent Labs    03/08/18 0301 03/09/18 0227  NA 144 141  K 3.5 3.7  CL 112* 109  CO2 25 26  GLUCOSE 100* 100*  BUN 51* 33*  CREATININE 2.14* 1.67*  CALCIUM 7.9* 7.9*  MG 1.5* 1.7  PHOS 2.7 1.9*   Liver Function Tests Recent Labs    03/08/18 0301 03/09/18 0227  AST 13* 14*  ALT 14 13*  ALKPHOS 31* 31*  BILITOT 0.8 0.5  PROT 5.3* 5.3*  ALBUMIN 2.8* 2.8*   No results for input(s): LIPASE, AMYLASE in the last 72 hours. Cardiac Enzymes No results for input(s): CKTOTAL, CKMB, CKMBINDEX, TROPONINI in the last 72 hours.  BNP: BNP (last 3 results) Recent Labs    04/29/17 0935  BNP 2,712.1*    ProBNP (last 3 results) No results for input(s): PROBNP in the last 8760 hours.   D-Dimer No results for input(s): DDIMER in the last 72 hours. Hemoglobin A1C No results for input(s): HGBA1C in the last 72 hours. Fasting Lipid Panel No results for input(s): CHOL, HDL, LDLCALC, TRIG, CHOLHDL, LDLDIRECT in the last 72 hours. Thyroid Function Tests No results for input(s): TSH, T4TOTAL, T3FREE, THYROIDAB in the last 72 hours.  Invalid input(s): FREET3  Other results:   Imaging     No results found.  Medications:     Scheduled Medications: . amiodarone  200 mg Oral Daily  . atorvastatin  40 mg Oral q1800  . carvedilol  3.125 mg Oral BID WC  . isosorbide-hydrALAZINE  1 tablet Oral TID  . oxybutynin  5 mg Oral QHS  . pantoprazole  40 mg Oral Daily  . PHOSPHA 250 NEUTRAL  500 mg Oral TID  . ranolazine  500 mg Oral BID  . sodium chloride flush  3 mL Intravenous Q12H     Infusions: . heparin 1,100 Units/hr (03/08/18 2133)     PRN Medications:  acetaminophen **OR** acetaminophen, fentaNYL (SUBLIMAZE) injection, ondansetron **OR** ondansetron (ZOFRAN) IV    Patient Profile   Ms Honeycutt is a 67 year old with h/o chronic a fib, NICM, biventricular heart failure, HTN, and hyperlipidemia. Failed multiple cardioversions.   INR on  admit >10.     Assessment/Plan   1. AKI on CKD Creatinine on admit > 4.  Creatinine continued to trend down 1.6 today.    2. GI Bleed FOBT +  GI consulted. No intervention at this time. Thought to be from INR >10.  No further melena.  Hgb 8.4   3. Chronic Systolic/Diastoic HF ECHo EF 40-45% Grade IDD Volume status appears stable. Hold lasix.  Continue low dose carvedilol and bidil.  No spiro with elevated creatinine.   4. PAF In NSR. On heparin drip. Had CVA 2017 on eliquis. Stop heparin. Start xarelto .   INR on admit > 10.  Continue amio 200 mg daily.   5.  Urinary Retention Urology following. Foley cath in place.  Failed I/O caths at home.   6.  Bilateral Hydroneprosis 4/12 Renal US with bilateral hydronephrosis  4/15 Renal US hydronephrosis resolved.   7.  H/o CVA- 2017 non hemorrhagic on eliquis at that time. She was switched to coumadin. Ok to start xarelto today.    Medication concerns reviewed with patient and pharmacy team. Barriers identified: n/a   Length of Stay: 4  Amy Clegg, NP  03/09/2018, 2:20 PM  Advanced Heart Failure Team Pager [phone] (M-F; 7a - 4p)  Please contact [facility] for night-coverage after hours (4p -7a ) and weekends on [email or web address]  Patient seen and examined with Darrick Grinder, NP. We discussed all aspects of the encounter. I agree with the assessment and plan as stated above.   Stable from HF perspective. EF 40-45%. Maintaining NSR on amio. Continue amio 200 daily. Switch heparin to xarelto. Supp mag.   If ready for d/c tomorrow would send home on the following cardiac meds  Xarelto Amio 200 daily Atorva 40 daily  Carvedilol 3.125 bid  Bidil 1 tab tid Ranexa 500 bid  Lasix 40 daily + kcl 20 daily  STOP spiro for now  Check BMET 1 week. Will arrange f/u in HF Clinic. Will sign off. Please call with questions   Glori Bickers, MD  10:50 PM

## 2018-03-09 NOTE — Progress Notes (Signed)
PROGRESS NOTE    [facility]  KXF:[id number] DOB: 1951/08/13 DOA: 03/05/2018 PCP: Patricia Borg, MD   Brief Narrative:  The patient is a very pleasant 67 yo Morbidly Obese AAF with a PMH of Atrial Fibrillation s/p Multiple DCCV Cardioversions on Anticoagulation with Warfarin, NICM with Chronic Biventricular CHF, Hx of Diverticulosis, HTN, HLD, Hx of Urinary Retention and UTI, Raynaud's Syndrome, CKD Stage 3 with Baseline Cr of 1.3-1.5, Hx of Recurrent Cardiogenic Shock and other comorbidities who presented to [facility] because of Dark Tarry Stools that started this AM.  Upon discussion with the patient she saw her Primary Heart Failure Cardiologist Dr. Haroldine [facility] yesterday who evaluated her for her Heart Failure. She had blood work drawn at the office and which revealed a severe derangement in Her Kidneys. Of note she was found to be more fatigued and this AM when she bent over to tie her shoe became lightheaded.   She presented to [facility] ED and was found to have Elevated INR and FOBT Positive so she was given Kcentra, Vitamin K, and transfused 1 unit. She is being admitted for AKI on CKD Stage 3, Suspected Upper GIB in the setting of Coagulopathy from Coumadin Toxicity, Acute Exacerbation of Chronic Biventricular CHF.   Cardiology, Gastroenterology, and Urology Consulted for further evaluation and recommendations. GI recommending Miralax and Cardiology recommending Heparin gtt for A Fib and changing to Xarelto when renal function looked improved.  Urology recommended continuing Foley catheter and repeat renal ultrasound was improved.  Discussed with heart failure team and they okayed to start Xarelto today.  Pharmacy consult was placed.  Assessment & Plan:   Principal Problem:   Acute kidney injury ([place]) Active Problems:   CKD (chronic kidney disease) stage 3, GFR 30-59 ml/min (HCC)   Coagulopathy (HCC)   NICM (nonischemic cardiomyopathy) (HCC)   Chronic systolic (congestive)  heart failure (HCC)   Chronic atrial fibrillation (HCC)   Hypertension   Acute blood loss anemia   Diverticulosis   Heme positive stool   Rectal bleed  Acute kidney injury on CKD (chronic kidney disease) stage 3, GFR 30-59 ml/min, improving  -Patient has developed in the past 48 hours worsening of renal function in the context of preadmission utilization of Entresto and Lasix; suspected that occult/initial onset of GI bleeding with associated likely hypotension i.e. decreased renal perfusion also contributing but now feel like it is mostly obstructive Uropathy and Acute Urinary Retention  -Baseline renal function: 7/1.44 -BUN/Cr Improved from 120/4.43 -> 117/4.28 -> 81/2.90 -> 51/2.14 -> 33/1.67 -Patient previously instructed to hold Entresto and Lasix; -Heart elevation in BUN over rapid timeframe without altered mentation more suggestive of acute process such as GI bleeding/azotemia -Bladder scan due to history of urinary retention showed >999 -Renal Ultrasound showed bilateral Hydro -Follow labs and avoid nephrotoxic medications -Treat underlying causes and placed Foley -Potentially acute systolic heart failure also contributing to acute renal dysfunction  -Urology Consulted and evaluated and recommending continue Foley at this time.  Dr. Junious Wagner also recommends following up with Dr. Pilar [facility] for CIC retraining versus continued Foley use versus suprapubic tube.  Dr. Junious Wagner also recommended rechecking a renal ultrasound in 1-2 days;  -Repeat U/S added this AM showed interval resolution of bilateral hydronephrosis, Foley catheter in place, and bilateral renal cysts. -Urology Dr. Baruch [facility] pending continuing Foley catheter with outpatient follow-up to discuss resuming CIC versus chronic Foley  -She was having bladder spasms added Ditropan 5 mg nightly. -Continue to Monitor and Repeat  CMP in AM  -PT and OT to evaluate and treat  Coagulopathy/Thrombocytopenia -Patient presented with a  therapeutic INR > 10.00 -Reversal agents of vitamin K and K Centra given with pharmacy managing follow-up lab -Continue to hold Warfarin; Cardiology started patient on heparin after my discussion of the heart failure team they will start Xarelto today -Low platelets likely reflective of bone marrow consumption with acute evolving anemia -PT/INR improved and was 14.6/1.15; Platelet Count now 124K   Acute Blood Loss Anemia in the setting of Chronic Anemia  -Baseline hemoglobin around 8.4-9.6 current hemoglobin on admit was 9.3 but do not suspect patient has equilibrated since bleeding onset only several hours prior -Agree with EDP decision to give at least 1 unit PRBCs -Order placed for RN to obtain CBC after blood infused-may require additional PRBCs in the next 24 hours -Her Hemoccult positive and agree with GI consultation (see below) -Repeat HH went from 9.5/30.3 -> 8.5/26.1 -> 8.4/27.8 -No back pain but could be experiencing spontaneous retroperitoneal hemorrhage continue to monitor-may require additional imaging  NICM (nonischemic cardiomyopathy)/Chronic Biventricular Heart Failure  -History of biventricular heart failure [facility] diagnosed May 2018 noting EF at that time 15% with improvement to 30-35% on follow-up echo -Echocardiogram August 2018: Severe concentric LVH with an EF of 30-35% -Has significant bilateral lower extremity edema and weight is up to 242 lbss from dry weight reported at 220 lbs -Given acute kidney injury and relative hypotension all home heart failure meds have been placed on hold (Lasix, Entresto and carvedilol) -Cardiology Consulted and appreciate Evaluation -Daily weights, strict I's/O -Patient is -10,028 Liters (autodiuresing) and Weight is down 13 pounds -Repeat echocardiogram this admission showed EF of 40-45% and Grade 1 Diastolic Dysfunction  -Currently maintaining sinus rhythm-previous heart failure felt to be tachycardia mediated in the context of atrial  fibrillation with RVR -Cardiology evaluated and recommend holding Lasix for now; Cardiology is starting carvedilol and BiDil back and recommend continue to hold spironolactone at this time due to elevated creatinine. -Discussed with the heart failure team and they are recommending okay for Xarelto starting today and we will monitor her overnight for any signs and symptoms of bleeding.  Chronic Atrial Fibrillation  -Maintaining sinus rhythm on Amiodarone- -Warfarin on hold in context of acute GI bleeding and will be discontinued per cardiology.  They will start heparin drip and recommending Xarelto once her renal function improves and that she had a nonhemorrhagic CVA on Eliquis in 2017 -CHA2DS2-VASc Score and unadjusted Ischemic Stroke Rate (% per year) is equal to 3.2 % stroke rate/year from a score of 3 -Pharmacy consulted for heparin drip dosing.  Will transition to [employer] today -Cardiology restarted Ranexa   Diverticulosis/Heme positive stool and GIB in the setting of Coagulapathy  -Patient with known diverticulosis based on prior CT the abdomen -Reports history of prior GI bleeding -Colonoscopy 12/04/17 revealed normal exam noting diverticulosis of the large intestine without perforation or abscess signs of active bleeding -Upper abdominal pain therefore bleeding could be upper GI or small bowel related -Continue Protonix 40 mg IV every Daily and change to p.o. -Formal GI consultation done they recommending conservative management and have no plans for endoscopy at present unless she has further active bleeding. They feel rectal bleeding was related to Constipation. -Dr. Lyndel Safe does recommending MiraLAX 17 g once a day for symptomatic judgment of constipation.  He also recommends Preparation H or Anusol suppositories if there is any further recurrent bleeding and recommends that anticoagulation can be restarted from  a GI standpoint. -Stop HH Lab draws -Repeat CBC in AM and will start Xarelto  in next coming days  Hypertension -Blood pressure this a.m. was 146/75 -Cardiology restarting Carvedilol 3.125 mg twice daily and Isosorbide-Hydralazine 1 tablet p.o. 3 times daily -Continue to Monitor  Bacteuria, Present on Admission -Patient is afebrile, no white count, and only complained of lower abdominal pain.  She denied any burning, or dysuria, or urinary frequency. -Urinalysis showed Hazy Urine, Small Leukocytes, Positive Nitrites, Rare Bacteria and 0-5 WBC -Urine Cx showed >100,000 CFU of Staphylococcus Species with Sensitivities to Gentamycin, Inducible Clindamycin, Macrobid, Rifampin, Tetracycline, and Vancomycin.  -It is felt that patient does not have an Active UTI given no symptoms, fever, white count. If patient does become symptomatic will treat   Hypomagnesemia -Patient's Mag Level was 1.7 this AM -Replete with IV Mag Sulfate 2 grams yesterday and we will replete with 1 g today -Continue to Monitor and Replete as Necessary -Repeat Mag Level in AM   Hypophosphatemia -Patient's Phosphorus level was 1.9 this AM -Replete with p.o. K-Phos Neutral 500 mg p.o. 3 times daily x3 doses -Continue to Monitor and Replete as Necessary -Repeat Phosphorus level in AM   DVT prophylaxis: SCDs; Cardiology started patient on Heparin gtt and will continue; Defer to them to Start Eliquis vs. Xarelto  Code Status: FULL CODE  Family Communication: No family present at bedside  Disposition Plan: Remain Inpatient for continued work up and obtain PT/OT Evaluation  Consultants:   Gastroenterology  Cardiology and Heart Failure Team   Urology   Procedures:    Antimicrobials:  Anti-infectives (From admission, onward)   None     Subjective: Seen and examined bedside and was feeling better. Had no active complaints and had just had a bowel movement.  No chest pain, shortness breath, nausea, vomiting.  Lower abdominal spasms have improved.  Denies any dysuria at this  time.  Objective: Vitals:   03/08/18 1827 03/08/18 2122 03/09/18 0500 03/09/18 0729  BP:  127/70  (!) 146/75  Pulse:  (!) 58  64  Resp:  18  18  Temp:  98.3 F (36.8 C)    TempSrc:  Oral    SpO2:  98%    Weight: 102.7 kg (226 lb 6.4 oz)  102.6 kg (226 lb 3.1 oz)   Height:        Intake/Output Summary (Last 24 hours) at 03/09/2018 0749 Last data filed at 03/08/2018 2300 Gross per 24 hour  Intake 1458 ml  Output 2075 ml  Net -617 ml   Filed Weights   03/07/18 1955 03/08/18 1827 03/09/18 0500  Weight: 104.9 kg (231 lb 4.2 oz) 102.7 kg (226 lb 6.4 oz) 102.6 kg (226 lb 3.1 oz)   Examination: Physical Exam:  Constitutional: Well-nourished, well-developed obese African-American female who is currently in no acute distress.  She is sitting at the edge of bedside and appears calm. Eyes: Sclerae Anicteric.  Conjunctivae and lids are normal. ENMT: External ears and nose appear normal.  Grossly normal hearing.  Mucous membranes moist Neck: Supple with no appreciable JVD Respiratory: Diminished to auscultation.  Unlabored breathing and no appreciable wheezing, rales, rhonchi.  No accessory muscle usage  Cardiovascular: Regular rate and rhythm.  Slightly bradycardic.  No appreciable murmurs, rubs, or gallops.  Trace lower extremity edema noted Abdomen: Soft, slightly tender, distended secondary to body habitus.  Bowel sounds present in all 4 quadrants GU: Deferred.  Patient has a Foley catheter in place draining yellow clear urine  Musculoskeletal: No contractures no cyanosis Skin: Warm and dry.  Still has some bruising on the right shoulder as well as lower extremities bilaterally.  No appreciable rashes noted.   Neurologic: Cranial nerves II through XII grossly intact with no appreciable focal deficits Psychiatric: Normal pleasant mood and affect.  Intact judgment and insight.  Awake alert and oriented x3  Data Reviewed: I have personally reviewed following labs and imaging  studies  CBC: Recent Labs  Lab 03/05/18 2329 03/06/18 0214  03/07/18 0030 03/07/18 0703 03/07/18 1250 03/08/18 0301 03/09/18 0227  WBC 6.3 6.6  --   --  7.2  --  5.9 6.6  NEUTROABS  --  4.8  --   --  5.5  --  4.2 4.5  HGB 9.2* 9.6*   < > 8.7* 9.7* 9.5* 8.5* 8.4*  HCT 28.5* 29.2*   < > 26.9* 29.9* 30.3* 26.1* 27.8*  MCV 88.8 89.8  --   --  90.6  --  91.3 92.7  PLT 123* 126*  --   --  136*  --  127* 124*   < > = values in this interval not displayed.   Basic Metabolic Panel: Recent Labs  Lab 03/05/18 1454 03/06/18 0214 03/06/18 0610 03/07/18 0703 03/08/18 0301 03/09/18 0227  NA 140  --  142 143 144 141  K 4.2  --  3.7 3.8 3.5 3.7  CL 110  --  115* 112* 112* 109  CO2 19*  --  17* 22 25 26   GLUCOSE 99  --  84 103* 100* 100*  BUN 120*  --  117* 81* 51* 33*  CREATININE 4.43*  --  4.28* 2.90* 2.14* 1.67*  CALCIUM 8.7*  --  8.6* 8.5* 7.9* 7.9*  MG  --  2.0  --  1.8 1.5* 1.7  PHOS  --  3.8  --  3.2 2.7 1.9*   GFR: Estimated Creatinine Clearance: 40.4 mL/min (A) (by C-G formula based on SCr of 1.67 mg/dL (H)). Liver Function Tests: Recent Labs  Lab 03/05/18 1454 03/06/18 0610 03/07/18 0703 03/08/18 0301 03/09/18 0227  AST 12* 15 18 13* 14*  ALT 12* 13* 16 14 13*  ALKPHOS 34* 33* 39 31* 31*  BILITOT 0.5 1.2 0.9 0.8 0.5  PROT 6.2* 5.6* 5.9* 5.3* 5.3*  ALBUMIN 3.3* 3.1* 3.1* 2.8* 2.8*   No results for input(s): LIPASE, AMYLASE in the last 168 hours. No results for input(s): AMMONIA in the last 168 hours. Coagulation Profile: Recent Labs  Lab 03/05/18 1817 03/05/18 2329 03/06/18 0610 03/06/18 1240 03/06/18 2305  INR 1.54 1.34 1.26 1.17 1.15   Cardiac Enzymes: No results for input(s): CKTOTAL, CKMB, CKMBINDEX, TROPONINI in the last 168 hours. BNP (last 3 results) No results for input(s): PROBNP in the last 8760 hours. HbA1C: No results for input(s): HGBA1C in the last 72 hours. CBG: No results for input(s): GLUCAP in the last 168 hours. Lipid Profile: No  results for input(s): CHOL, HDL, LDLCALC, TRIG, CHOLHDL, LDLDIRECT in the last 72 hours. Thyroid Function Tests: No results for input(s): TSH, T4TOTAL, FREET4, T3FREE, THYROIDAB in the last 72 hours. Anemia Panel: No results for input(s): VITAMINB12, FOLATE, FERRITIN, TIBC, IRON, RETICCTPCT in the last 72 hours. Sepsis Labs: No results for input(s): PROCALCITON, LATICACIDVEN in the last 168 hours.  Recent Results (from the past 240 hour(s))  Culture, Urine     Status: Abnormal   Collection Time: 03/06/18 12:29 AM  Result Value Ref Range Status   Specimen Description URINE,  CATHETERIZED  Final   Special Requests   Final    NONE Performed at [facility], [place] [address], [place], [place] [address]    Culture (A)  Final    >=100,000 COLONIES/mL STAPHYLOCOCCUS SPECIES (COAGULASE NEGATIVE)   Report Status 03/08/2018 FINAL  Final   Organism ID, Bacteria STAPHYLOCOCCUS SPECIES (COAGULASE NEGATIVE) (A)  Final      Susceptibility   Staphylococcus species (coagulase negative) - MIC*    CIPROFLOXACIN >=8 RESISTANT Resistant     GENTAMICIN <=0.5 SENSITIVE Sensitive     NITROFURANTOIN <=16 SENSITIVE Sensitive     OXACILLIN >=4 RESISTANT Resistant     TETRACYCLINE 2 SENSITIVE Sensitive     VANCOMYCIN 1 SENSITIVE Sensitive     TRIMETH/SULFA 160 RESISTANT Resistant     CLINDAMYCIN >=8 RESISTANT Resistant     RIFAMPIN <=0.5 SENSITIVE Sensitive     Inducible Clindamycin NEGATIVE Sensitive     * >=100,000 COLONIES/mL STAPHYLOCOCCUS SPECIES (COAGULASE NEGATIVE)  MRSA PCR Screening     Status: None   Collection Time: 03/06/18  4:08 PM  Result Value Ref Range Status   MRSA by PCR NEGATIVE NEGATIVE Final    Comment:        The GeneXpert MRSA Assay (FDA approved for NASAL specimens only), is one component of a comprehensive MRSA colonization surveillance program. It is not intended to diagnose MRSA infection nor to guide or monitor treatment for MRSA infections. Performed at [facility], [place] [address], [place], [place] [address]     Radiology Studies: US Renal  Result Date: 03/08/2018 CLINICAL DATA:  Hydronephrosis. EXAM: RENAL / URINARY TRACT ULTRASOUND COMPLETE COMPARISON:  Renal ultrasound 03/05/2018. CT of the abdomen pelvis 12/02/2017. FINDINGS: Right Kidney: Length: 10.3 cm, within normal limits. This is decreased from the prior exam. Hydronephrosis has resolved. A cyst at the lower pole the right kidney measures 1.6 cm maximally. No other focal parenchymal lesions are present. Left Kidney: Length: 10.6 cm, within normal limits. Hydronephrosis has resolved. A cyst at the lower pole measures 4.2 cm maximally. No other parenchymal lesions are present. Bladder: A Foley catheter is in place. IMPRESSION: 1. Interval resolution of bilateral hydronephrosis. 2. Foley catheter in place. 3. Bilateral renal cysts. Electronically Signed   By: San Morelle M.D.   On: 03/08/2018 09:51   Scheduled Meds: . amiodarone  200 mg Oral Daily  . atorvastatin  40 mg Oral q1800  . carvedilol  3.125 mg Oral BID WC  . isosorbide-hydrALAZINE  1 tablet Oral TID  . oxybutynin  5 mg Oral QHS  . pantoprazole  40 mg Oral Daily  . ranolazine  500 mg Oral BID  . sodium chloride flush  3 mL Intravenous Q12H   Continuous Infusions: . heparin 1,100 Units/hr (03/08/18 2133)    LOS: 4 days   Kerney Elbe, DO [facility] Pager [phone]  If 7PM-7AM, please contact night-coverage [email or web address] Password [email or web address] 03/09/2018, 7:49 AM

## 2018-03-09 NOTE — Care Management Note (Addendum)
Case Management Note  Patient Details  Name: [facility] MRN: [id number] Date of Birth: [birth year]  Subjective/Objective:    CHF, Chronic Systolic/Diastolic HF, PAF, Urinary Retention, Bilateral Hydorneprosis              Action/Plan: NCM spoke to pt and provided pt with Xarelto 30 day free trial card and $10 copay card. Pt states she lives at home alone. Discussed home oxygen needed at time of dc. Pt is refusing home oxygen. Unit RN will attempt to wean oxygen.  03/10/2018 Made attending aware that pt is refusing oxygen for home.   Chesley Mires, RN        # 6. S/W DEBBIE @ [facility] # [phone]   XARELTO 20 MG DAILY  COVER- YES  CO-PAY-ZERO DOLLARS  PRIOR APPROVAL- NO   PREFERRED PHARMACY : YES-[facility]    Expected Discharge Date:                  Expected Discharge Plan:  Home/Self Care  In-House Referral:  NA  Discharge planning Services  CM Consult, Medication Assistance  Post Acute Care Choice:  NA Choice offered to:  NA  DME Arranged:  Oxygen DME Agency:  NA  HH Arranged:  NA HH Agency:  NA  Status of Service:  Completed, signed off  If discussed at [place] of Stay Meetings, dates discussed:    Additional Comments:  Erenest Rasher, RN 03/09/2018, 3:26 PM

## 2018-03-10 LAB — COMPREHENSIVE METABOLIC PANEL
ALBUMIN: 2.9 g/dL — AB (ref 3.5–5.0)
ALT: 12 U/L — ABNORMAL LOW (ref 14–54)
AST: 12 U/L — AB (ref 15–41)
Alkaline Phosphatase: 34 U/L — ABNORMAL LOW (ref 38–126)
Anion gap: 6 (ref 5–15)
BUN: 21 mg/dL — ABNORMAL HIGH (ref 6–20)
CHLORIDE: 108 mmol/L (ref 101–111)
CO2: 28 mmol/L (ref 22–32)
CREATININE: 1.53 mg/dL — AB (ref 0.44–1.00)
Calcium: 8 mg/dL — ABNORMAL LOW (ref 8.9–10.3)
GFR calc Af Amer: 40 mL/min — ABNORMAL LOW (ref >60)
GFR, EST NON AFRICAN AMERICAN: 35 mL/min — AB (ref >60)
Glucose, Bld: 92 mg/dL (ref 65–99)
Potassium: 3.7 mmol/L (ref 3.5–5.1)
Sodium: 142 mmol/L (ref 135–145)
Total Bilirubin: 0.6 mg/dL (ref 0.3–1.2)
Total Protein: 5.5 g/dL — ABNORMAL LOW (ref 6.5–8.1)

## 2018-03-10 LAB — CBC WITH DIFFERENTIAL/PLATELET
Basophils Absolute: 0 10*3/uL (ref 0.0–0.1)
Basophils Relative: 0 %
Eosinophils Absolute: 0.4 10*3/uL (ref 0.0–0.7)
Eosinophils Relative: 6 %
HCT: 27.1 % — ABNORMAL LOW (ref 36.0–46.0)
Hemoglobin: 8.3 g/dL — ABNORMAL LOW (ref 12.0–15.0)
Lymphocytes Relative: 21 %
Lymphs Abs: 1.3 10*3/uL (ref 0.7–4.0)
MCH: 28.6 pg (ref 26.0–34.0)
MCHC: 30.6 g/dL (ref 30.0–36.0)
MCV: 93.4 fL (ref 78.0–100.0)
Monocytes Absolute: 0.3 10*3/uL (ref 0.1–1.0)
Monocytes Relative: 5 %
Neutro Abs: 4 10*3/uL (ref 1.7–7.7)
Neutrophils Relative %: 68 %
Platelets: 136 10*3/uL — ABNORMAL LOW (ref 150–400)
RBC: 2.9 MIL/uL — ABNORMAL LOW (ref 3.87–5.11)
RDW: 16.2 % — ABNORMAL HIGH (ref 11.5–15.5)
WBC: 5.9 10*3/uL (ref 4.0–10.5)

## 2018-03-10 LAB — PHOSPHORUS: Phosphorus: 3.1 mg/dL (ref 2.5–4.6)

## 2018-03-10 LAB — MAGNESIUM: MAGNESIUM: 1.7 mg/dL (ref 1.7–2.4)

## 2018-03-10 MED ORDER — POTASSIUM CHLORIDE ER 10 MEQ PO TBCR: 20.0000 meq | EXTENDED_RELEASE_TABLET | Freq: Every day | ORAL | 0 refills | Status: DC

## 2018-03-10 MED ORDER — AMIODARONE HCL 200 MG PO TABS: 200.0000 mg | ORAL_TABLET | Freq: Every day | ORAL | 0 refills | Status: DC

## 2018-03-10 MED ORDER — ATORVASTATIN CALCIUM 40 MG PO TABS: 40.0000 mg | ORAL_TABLET | Freq: Every day | ORAL | 0 refills | Status: DC

## 2018-03-10 MED ORDER — RIVAROXABAN 20 MG PO TABS: 20.0000 mg | ORAL_TABLET | Freq: Every day | ORAL | 0 refills | Status: DC

## 2018-03-10 NOTE — Progress Notes (Signed)
Discharge Planning: Pt agreed to home oxygen. Contacted [facility] for oxygen for home. Will deliver portable tank to room and concentrator to home. Jonnie Finner RN CCM Case Mgmt phone [phone]

## 2018-03-10 NOTE — Progress Notes (Signed)
SATURATION QUALIFICATIONS: (This note is used to comply with regulatory documentation for home oxygen)  Patient Saturations on Room Air at Rest = 94%  Patient Saturations on Room Air while Ambulating = 87%  Patient Saturations on 2 Liters of oxygen while Ambulating = 92%  Please briefly explain why patient needs home oxygen: CHF

## 2018-03-10 NOTE — Progress Notes (Addendum)
Please see note by Hilary Hertz RN    SATURATION QUALIFICATIONS: (This note is used to comply with regulatory documentation for home oxygen)  Patient Saturations on Room Air at Rest = 80%  Patient Saturations on Room Air while Ambulating = %  Patient Saturations on 2 Liters of oxygen while Ambulating = 96%  Please briefly explain why patient needs home oxygen: CHF

## 2018-03-10 NOTE — Discharge Instructions (Signed)
Information on my medicine - XARELTO (Rivaroxaban)  This medication education was reviewed with me or my healthcare representative as part of my discharge preparation.  The pharmacist that spoke with me during my hospital stay was:  Betsey Holiday, [facility]  Why was Xarelto prescribed for you? Xarelto was prescribed for you to reduce the risk of a blood clot forming that can cause a stroke if you have a medical condition called atrial fibrillation (a type of irregular heartbeat).  What do you need to know about xarelto ? Take your Xarelto ONCE DAILY at the same time every day with your evening meal. If you have difficulty swallowing the tablet whole, you may crush it and mix in applesauce just prior to taking your dose.  Take Xarelto exactly as prescribed by your doctor and DO NOT stop taking Xarelto without talking to the doctor who prescribed the medication.  Stopping without other stroke prevention medication to take the place of Xarelto may increase your risk of developing a clot that causes a stroke.  Refill your prescription before you run out.  After discharge, you should have regular check-up appointments with your healthcare provider that is prescribing your Xarelto.  In the future your dose may need to be changed if your kidney function or weight changes by a significant amount.  What do you do if you miss a dose? If you are taking Xarelto ONCE DAILY and you miss a dose, take it as soon as you remember on the same day then continue your regularly scheduled once daily regimen the next day. Do not take two doses of Xarelto at the same time or on the same day.   Important Safety Information A possible side effect of Xarelto is bleeding. You should call your healthcare provider right away if you experience any of the following: ? Bleeding from an injury or your nose that does not stop. ? Unusual colored urine (red or dark brown) or unusual colored stools (red or  black). ? Unusual bruising for unknown reasons. ? A serious fall or if you hit your head (even if there is no bleeding).  Some medicines may interact with Xarelto and might increase your risk of bleeding while on Xarelto. To help avoid this, consult your healthcare provider or pharmacist prior to using any new prescription or non-prescription medications, including herbals, vitamins, non-steroidal anti-inflammatory drugs (NSAIDs) and supplements.  This website has more information on Xarelto: [email or web address]

## 2018-03-10 NOTE — Evaluation (Signed)
Occupational Therapy Evaluation Patient Details Name: Patricia Wagner MRN: [id number] DOB: 10-31-1951 Today's Date: 03/10/2018    History of Present Illness Patricia Wagner is a 67 y.o. female with medical history significant for atrial fibrillation on chronic anticoagulation with warfarin, history of chronic atrial fibrillation maintaining sinus rhythm on amiodarone, nonischemic cardiomyopathy with severe concentric LVH with initial echocardiogram when diagnosed EF 15% now EF 30-35% on echo August 2018, known diverticulosis on CT, history of GI bleed on warfarin, chronic kidney disease stage III, hypertension, arthritis, obesity. Pt admitted with dark tary stools.   Clinical Impression   Pt is functioning modified independently in ADL. Recommended seated showering and grab bars on tub, but pt refusing. Non-receptive to education in energy conservation strategies. Pt concerned about home 02 due to cost.    Follow Up Recommendations  No OT follow up    Equipment Recommendations  (pt is declining any equipment due to cost)    Recommendations for Other Services       Precautions / Restrictions Precautions Precautions: Fall Restrictions Weight Bearing Restrictions: No      Mobility Bed Mobility Overal bed mobility: Modified Independent                Transfers Overall transfer level: Modified independent Equipment used: Straight cane                  Balance Overall balance assessment: Modified Independent                                         ADL either performed or assessed with clinical judgement   ADL Overall ADL's : Modified independent                                       General ADL Comments: educated in benefits of seated showering and grab bars, pt not receptive to energy conservation education, denies that she fatigues     Vision Baseline Vision/History: Wears glasses Wears Glasses: Reading only Patient Visual  Report: No change from baseline       Perception     Praxis      Pertinent Vitals/Pain Pain Assessment: No/denies pain     Hand Dominance Right   Extremity/Trunk Assessment Upper Extremity Assessment Upper Extremity Assessment: Overall WFL for tasks assessed   Lower Extremity Assessment Lower Extremity Assessment: Defer to PT evaluation   Cervical / Trunk Assessment Cervical / Trunk Assessment: Normal   Communication Communication Communication: No difficulties   Cognition Arousal/Alertness: Awake/alert Behavior During Therapy: WFL for tasks assessed/performed Overall Cognitive Status: Within Functional Limits for tasks assessed                                     General Comments       Exercises     Shoulder Instructions      Home Living Family/patient expects to be discharged to:: Private residence Living Arrangements: Alone Available Help at Discharge: Family;Available PRN/intermittently Type of Home: Apartment Home Access: Level entry     Home Layout: One level     Bathroom Shower/Tub: Teacher, early years/pre: Handicapped height     Home Equipment: Cane - single point  Prior Functioning/Environment Level of Independence: Independent        Comments: uses cane due to "bad L knee"        OT Problem List:        OT Treatment/Interventions:      OT Goals(Current goals can be found in the care plan section) Acute Rehab OT Goals Patient Stated Goal: home  OT Frequency:     Barriers to D/C:            Co-evaluation              AM-PAC PT "6 Clicks" Daily Activity     Outcome Measure Help from another person eating meals?: None Help from another person taking care of personal grooming?: None Help from another person toileting, which includes using toliet, bedpan, or urinal?: None Help from another person bathing (including washing, rinsing, drying)?: None Help from another person to put on  and taking off regular upper body clothing?: None Help from another person to put on and taking off regular lower body clothing?: None 6 Click Score: 24   End of Session Equipment Utilized During Treatment: Gait belt Nurse Communication: Mobility status  Activity Tolerance: Patient tolerated treatment well Patient left: in chair;with call bell/phone within reach  OT Visit Diagnosis: Other abnormalities of gait and mobility (R26.89)                Time: 2774-1287 OT Time Calculation (min): 49 min Charges:  OT General Charges $OT Visit: 1 Visit OT Evaluation $OT Eval Moderate Complexity: 1 Mod OT Treatments $Self Care/Home Management : 23-37 mins G-Codes:     03-23-18 Nestor Lewandowsky, OTR/L Pager: [place], Haze Boyden 03/23/2018, 12:08 PM

## 2018-03-10 NOTE — Discharge Summary (Addendum)
Physician Discharge Summary  Patricia Wagner MPN:[id number] DOB: 12-Aug-1951 DOA: 03/05/2018  PCP: Biagio Borg, MD  Admit date: 03/05/2018 Discharge date: 03/10/2018  Admitted From: Home Disposition:  Home  Recommendations for Outpatient Follow-up:  1. Follow up with PCP in 1-2 weeks 2. Please obtain BMP/Magnesium in one week your next doctors visit.  3. Follow up with Urology in one week  4. Follow up with Cardiology in One week  5. Coumadin switched to Xarelto  6. Aldactone/Spirinolactone stopped  7. Arranged for 2L [place] home O2 at night and with ambulation 8. Needs outpatient Sleep study eval.    Equipment/Devices: Home O2 Discharge Condition: Stable CODE STATUS: Full Code  Diet recommendation: Cardiac   Brief/Interim Summary: 67 year old female with history of atrial fibrillation status post multiple cardioversion on Coumadin, nonischemic cardiomyopathy, biventricular systolic failure, diverticulosis, essential hypertension, hyperlipidemia, chronic urinary retention, urinary tract infections, Reynard syndrome, CKD stage III came to the hospital complains of dark tarry stool.  When initially patient presented her INR was greater than 10 therefore she was given K Centra, vitamin K and 1 unit PRBC transfusion.  She was Hemoccult positive therefore gastroenterology was consulted.  Patient had recent colonoscopy back in 2019 January which showed diverticular disease.  After correction of her INR her bleeding subsided and hemoglobin remained stable.  It was recommended that she started on PPI and follow-up with outpatient gastroenterology. Her hospital course was also complicated by acute kidney injury likely secondary to urinary retention therefore Foley catheter was placed.  Urology was consulted who recommended leaving the Foley catheter in and following up in 1-2 weeks for evaluation of chronic indwelling catheter versus removal of the catheter.  Over the course of 3 days her creatinine  improved and returned to the baseline of 1.5.  She had good urine output. Once she was cleared by gastroenterology her Coumadin was switched to Xarelto.  She was also followed by [name] from cardiology who recommended to change her amiodarone from twice daily to once daily, switching Coumadin to Xarelto and stopping Aldactone.  Patient will follow up with their clinic in 1 week.  During hospital stay she also had slight desaturations or oxygen overnight, dropping down to 85% on room air therefore 2 L nasal cannula was placed.  During daytime at rest patient's oxygen saturation stayed greater than 90% but with ambulation it dropped to 85% and even at night it drops down to less than 88%.  At first patient was very reluctant to go home on oxygen despite of me explaining her its necessity but eventually patient agreed.  Not sure if she will remain compliant with this.  Today patient has reached maximum benefit from an hospital stay and is stable to be discharged with outpatient follow-up and recommendations as stated above.  No complaints this morning and patient is eager to go home.  No acute events overnight.  HEENT/EYES = negative for pain, redness, loss of vision, double vision, blurred vision, loss of hearing, sore throat, hoarseness, dysphagia Cardiovascular= negative for chest pain, palpitation, murmurs, lower extremity swelling Respiratory/lungs= negative for shortness of breath, cough, hemoptysis, wheezing, mucus production Gastrointestinal= negative for nausea, vomiting,, abdominal pain, melena, hematemesis Genitourinary= negative for Dysuria, Hematuria, Change in Urinary Frequency MSK = Negative for arthralgia, myalgias, Back Pain, Joint swelling  Neurology= Negative for headache, seizures, numbness, tingling  Psychiatry= Negative for anxiety, depression, suicidal and homocidal ideation Allergy/Immunology= Medication/Food allergy as listed  Skin= Negative for Rash, lesions, ulcers,  itching   Discharge  Diagnoses:  Principal Problem:   Acute kidney injury ([place]) Active Problems:   PAF (paroxysmal atrial fibrillation) (HCC)   CKD (chronic kidney disease) stage 3, GFR 30-59 ml/min (HCC)   Coagulopathy (HCC)   NICM (nonischemic cardiomyopathy) (HCC)   Chronic systolic (congestive) heart failure (HCC)   Chronic atrial fibrillation (HCC)   Hypertension   Acute blood loss anemia   Diverticulosis   Heme positive stool   Rectal bleed  Lower GI bleed likely diverticular bleed Acute blood loss anemia on anemia of chronic disease Coagulopathy, supratherapeutic INR - This has resolved.  Initially patient did receive vitamin K, Kcentra.  Patient also received transfusion.  Gastroneurology was consulted who did not plan on performing any procedures in the hospital as patient had recent colonoscopy and symptoms that subsided after reversing her INR. -Hemoglobin remained stable, will need to follow-up outpatient with gastroenterology and primary care physician  Acute kidney injury, likely postobstructive uropathy - At the time of admission patient's creatinine was greater than 4 and it was noted that she was retaining greater than 1 L of urine therefore Foley catheter was placed.  This quickly resolved her renal dysfunction and she was urinating well.  Her creatinine trended down to her baseline of 1.5.  Initially her renal ultrasound showed bilateral hydronephrosis and then repeat ultrasound was negative.  Urology was consulted and recommended outpatient follow-up in about 1-2 weeks  Nonischemic cardiomyopathy with biventricular systolic congestive heart failure; ef 40% -At this time patient is euvolemic in nature.  Heart failure team was following patient while in the hospital, cleared to resume home regimen of Xarelto. -Aldactone has been discontinued, will send patient home on Coreg, Bidil, Ranexa, statin  Chronic atrial fibrillation -Initially patient was on amiodarone 200  mg twice daily but has been switched to daily with recommendations of cardiology team -Coumadin stopped, transition to [place] hypertension -Continue Coreg, BiDil  Nocturnal Hypoxia -may have underlying OSA and CHF, needs outpatient sleep eval. Will send her home on 2L [place] at bedtime. CM will make arrangement.    Discharge Instructions   Allergies as of 03/10/2018      Reactions   Ace Inhibitors Palpitations      Medication List    STOP taking these medications   spironolactone 25 MG tablet Commonly known as:  ALDACTONE   warfarin 3 MG tablet Commonly known as:  COUMADIN     TAKE these medications   acetaminophen 325 MG tablet Commonly known as:  TYLENOL Take 650 mg by mouth every 6 (six) hours as needed (for pain).   amiodarone 200 MG tablet Commonly known as:  PACERONE Take 1 tablet (200 mg total) by mouth daily. Start taking on:  03/11/2018 What changed:    how much to take  how to take this  when to take this  additional instructions   atorvastatin 40 MG tablet Commonly known as:  LIPITOR Take 1 tablet (40 mg total) by mouth daily at 6 PM.   carvedilol 3.125 MG tablet Commonly known as:  COREG Take 1 tablet (3.125 mg total) by mouth 2 (two) times daily.   HYDROcodone-acetaminophen 5-325 MG tablet Commonly known as:  NORCO/VICODIN Take 1 tablet by mouth every 6 (six) hours as needed for pain. for pain   Investigational - Study Medication Take 1 tablet by mouth 2 (two) times daily. Study name: Galactic HF Study  Additional study details: Omecamtiv Mecarbil or Placebo   isosorbide-hydrALAZINE 20-37.5 MG tablet Commonly known as:  BIDIL Take  1 tablet by mouth 3 (three) times daily.   potassium chloride 10 MEQ tablet Commonly known as:  K-DUR Take 2 tablets (20 mEq total) by mouth daily.   ranolazine 500 MG 12 hr tablet Commonly known as:  RANEXA Take 1 tablet (500 mg total) by mouth 2 (two) times daily.   rivaroxaban 20 MG Tabs  tablet Commonly known as:  XARELTO Take 1 tablet (20 mg total) by mouth daily with supper.            Durable Medical Equipment  (From admission, onward)        Start     Ordered   03/10/18 1202  For home use only DME oxygen  Once    Question Answer Comment  Mode or (Route) Nasal cannula   Liters per Minute 2   Frequency Only at night (stationary unit needed)   Oxygen delivery system Gas      03/10/18 1201     Follow-up Information    Nickie Retort, MD Follow up.   Specialty:  Urology Why:  1-2 weeks to discuss catheter management Contact information: [place] [address] [phone]        Biagio Borg, MD Follow up in 1 week(s).   Specialties:  Internal Medicine, Radiology Contact information: [place] [address] [phone]        Bensimhon, Shaune Pascal, MD. Schedule an appointment as soon as possible for a visit in 1 week(s).   Specialty:  Cardiology Contact information: [place] [address] [phone]          Allergies  Allergen Reactions  . Ace Inhibitors Palpitations    You were cared for by a hospitalist during your hospital stay. If you have any questions about your discharge medications or the care you received while you were in the hospital after you are discharged, you can call the unit and asked to speak with the hospitalist on call if the hospitalist that took care of you is not available. Once you are discharged, your primary care physician will handle any further medical issues. Please note that no refills for any discharge medications will be authorized once you are discharged, as it is imperative that you return to your primary care physician (or establish a relationship with a primary care physician if you do not have one) for your aftercare needs so that they can reassess your need for medications and monitor your lab  values.  Consultations:  Urology  Cardiology   Procedures/Studies: US Renal  Result Date: 03/08/2018 CLINICAL DATA:  Hydronephrosis. EXAM: RENAL / URINARY TRACT ULTRASOUND COMPLETE COMPARISON:  Renal ultrasound 03/05/2018. CT of the abdomen pelvis 12/02/2017. FINDINGS: Right Kidney: Length: 10.3 cm, within normal limits. This is decreased from the prior exam. Hydronephrosis has resolved. A cyst at the lower pole the right kidney measures 1.6 cm maximally. No other focal parenchymal lesions are present. Left Kidney: Length: 10.6 cm, within normal limits. Hydronephrosis has resolved. A cyst at the lower pole measures 4.2 cm maximally. No other parenchymal lesions are present. Bladder: A Foley catheter is in place. IMPRESSION: 1. Interval resolution of bilateral hydronephrosis. 2. Foley catheter in place. 3. Bilateral renal cysts. Electronically Signed   By: San Morelle M.D.   On: 03/08/2018 09:51   US Renal  Result Date: 03/05/2018 CLINICAL DATA:  Stage 3 chronic renal disease with acute renal deterioration EXAM: RENAL / URINARY TRACT ULTRASOUND COMPLETE  COMPARISON:  CT abdomen and pelvis December 02, 2017 FINDINGS: Right Kidney: Length: 11.3 cm. Echogenicity and renal cortical thickness are within normal limits. No perinephric fluid visualized. There is moderately severe hydronephrosis. There is a cyst arising from the lower pole of the right kidney measuring 2.0 x 1.4 x 1.8 cm. No sonographically demonstrable calculus or ureterectasis. Left Kidney: Length: 11.0 cm. Echogenicity and renal cortical thickness are within normal limits. No perinephric fluid visualized. There is moderate hydronephrosis on the left. There is a cyst arising from the mid left kidney measuring 4.1 x 3.3 x 3.1 cm. No sonographically demonstrable calculus or ureterectasis. Bladder: Appears normal for degree of bladder distention. Urinary bladder is distended. Patient was unable to void. IMPRESSION: Bilateral  hydronephrosis, slightly more severe on the right than on the left. Distended urinary bladder. Patient was unable to void. Question bladder outlet obstruction with hydronephrosis secondary to stasis phenomenon. There are renal cysts bilaterally. Renal cortical thickness and echogenicity within normal limits bilaterally. Electronically Signed   By: Lowella Grip III M.D.   On: 03/05/2018 21:35   Dg Chest Port 1 View  Result Date: 03/05/2018 CLINICAL DATA:  Blood in stool EXAM: PORTABLE CHEST 1 VIEW COMPARISON:  12/29/2017 FINDINGS: Cardiomegaly with mild vascular congestion. Airspace disease at the left lung base. Aortic atherosclerosis. No pneumothorax. IMPRESSION: 1. Cardiomegaly with mild vascular congestion 2. Airspace disease at the left base, atelectasis versus pneumonia Electronically Signed   By: Donavan Foil M.D.   On: 03/05/2018 18:52      Subjective:   Discharge Exam: Vitals:   03/10/18 0851 03/10/18 1159  BP: 107/61 (!) 145/69  Pulse: (!) 58 (!) 59  Resp: 20 16  Temp: 98.3 F (36.8 C) 97.9 F (36.6 C)  SpO2: (!) 86% 94%   Vitals:   03/10/18 0741 03/10/18 0800 03/10/18 0851 03/10/18 1159  BP: 136/67  107/61 (!) 145/69  Pulse: 64 (!) 58 (!) 58 (!) 59  Resp:  17 20 16   Temp:   98.3 F (36.8 C) 97.9 F (36.6 C)  TempSrc:   Oral Oral  SpO2:  100% (!) 86% 94%  Weight:      Height:        General: Pt is alert, awake, not in acute distress Cardiovascular: RRR, S1/S2 +, no rubs, no gallops Respiratory: CTA bilaterally, no wheezing, no rhonchi Abdominal: Soft, NT, ND, bowel sounds + Extremities: no edema, no cyanosis    The results of significant diagnostics from this hospitalization (including imaging, microbiology, ancillary and laboratory) are listed below for reference.     Microbiology: Recent Results (from the past 240 hour(s))  Culture, Urine     Status: Abnormal   Collection Time: 03/06/18 12:29 AM  Result Value Ref Range Status   Specimen  Description URINE, CATHETERIZED  Final   Special Requests   Final    NONE Performed at [facility], [address], [place], [place] [address]    Culture (A)  Final    >=100,000 COLONIES/mL STAPHYLOCOCCUS SPECIES (COAGULASE NEGATIVE)   Report Status 03/08/2018 FINAL  Final   Organism ID, Bacteria STAPHYLOCOCCUS SPECIES (COAGULASE NEGATIVE) (A)  Final      Susceptibility   Staphylococcus species (coagulase negative) - MIC*    CIPROFLOXACIN >=8 RESISTANT Resistant     GENTAMICIN <=0.5 SENSITIVE Sensitive     NITROFURANTOIN <=16 SENSITIVE Sensitive     OXACILLIN >=4 RESISTANT Resistant     TETRACYCLINE 2 SENSITIVE Sensitive     VANCOMYCIN 1  SENSITIVE Sensitive     TRIMETH/SULFA 160 RESISTANT Resistant     CLINDAMYCIN >=8 RESISTANT Resistant     RIFAMPIN <=0.5 SENSITIVE Sensitive     Inducible Clindamycin NEGATIVE Sensitive     * >=100,000 COLONIES/mL STAPHYLOCOCCUS SPECIES (COAGULASE NEGATIVE)  MRSA PCR Screening     Status: None   Collection Time: 03/06/18  4:08 PM  Result Value Ref Range Status   MRSA by PCR NEGATIVE NEGATIVE Final    Comment:        The GeneXpert MRSA Assay (FDA approved for NASAL specimens only), is one component of a comprehensive MRSA colonization surveillance program. It is not intended to diagnose MRSA infection nor to guide or monitor treatment for MRSA infections. Performed at [facility], [place] [address] [place], [place] [address]      Labs: BNP (last 3 results) Recent Labs    04/29/17 0935  BNP 0,086.7*   Basic Metabolic Panel: Recent Labs  Lab 03/06/18 0214 03/06/18 0610 03/07/18 0703 03/08/18 0301 03/09/18 0227 03/10/18 0238  NA  --  142 143 144 141 142  K  --  3.7 3.8 3.5 3.7 3.7  CL  --  115* 112* 112* 109 108  CO2  --  17* 22 25 26 28   GLUCOSE  --  84 103* 100* 100* 92  BUN  --  117* 81* 51* 33* 21*  CREATININE  --  4.28* 2.90* 2.14* 1.67* 1.53*  CALCIUM  --  8.6* 8.5* 7.9* 7.9* 8.0*  MG 2.0  --  1.8 1.5* 1.7  1.7  PHOS 3.8  --  3.2 2.7 1.9* 3.1   Liver Function Tests: Recent Labs  Lab 03/06/18 0610 03/07/18 0703 03/08/18 0301 03/09/18 0227 03/10/18 0238  AST 15 18 13* 14* 12*  ALT 13* 16 14 13* 12*  ALKPHOS 33* 39 31* 31* 34*  BILITOT 1.2 0.9 0.8 0.5 0.6  PROT 5.6* 5.9* 5.3* 5.3* 5.5*  ALBUMIN 3.1* 3.1* 2.8* 2.8* 2.9*   No results for input(s): LIPASE, AMYLASE in the last 168 hours. No results for input(s): AMMONIA in the last 168 hours. CBC: Recent Labs  Lab 03/06/18 0214  03/07/18 0703 03/07/18 1250 03/08/18 0301 03/09/18 0227 03/10/18 0238  WBC 6.6  --  7.2  --  5.9 6.6 5.9  NEUTROABS 4.8  --  5.5  --  4.2 4.5 4.0  HGB 9.6*   < > 9.7* 9.5* 8.5* 8.4* 8.3*  HCT 29.2*   < > 29.9* 30.3* 26.1* 27.8* 27.1*  MCV 89.8  --  90.6  --  91.3 92.7 93.4  PLT 126*  --  136*  --  127* 124* 136*   < > = values in this interval not displayed.   Cardiac Enzymes: No results for input(s): CKTOTAL, CKMB, CKMBINDEX, TROPONINI in the last 168 hours. BNP: Invalid input(s): POCBNP CBG: No results for input(s): GLUCAP in the last 168 hours. D-Dimer No results for input(s): DDIMER in the last 72 hours. Hgb A1c No results for input(s): HGBA1C in the last 72 hours. Lipid Profile No results for input(s): CHOL, HDL, LDLCALC, TRIG, CHOLHDL, LDLDIRECT in the last 72 hours. Thyroid function studies No results for input(s): TSH, T4TOTAL, T3FREE, THYROIDAB in the last 72 hours.  Invalid input(s): FREET3 Anemia work up No results for input(s): VITAMINB12, FOLATE, FERRITIN, TIBC, IRON, RETICCTPCT in the last 72 hours. Urinalysis    Component Value Date/Time   COLORURINE YELLOW 03/06/2018 0029   APPEARANCEUR HAZY (A) 03/06/2018 0029  LABSPEC 1.012 03/06/2018 0029   PHURINE 8.0 03/06/2018 0029   GLUCOSEU NEGATIVE 03/06/2018 0029   GLUCOSEU NEGATIVE 02/02/2018 1700   HGBUR NEGATIVE 03/06/2018 0029   BILIRUBINUR NEGATIVE 03/06/2018 0029   KETONESUR NEGATIVE 03/06/2018 0029   PROTEINUR NEGATIVE  03/06/2018 0029   UROBILINOGEN 0.2 02/02/2018 1700   NITRITE POSITIVE (A) 03/06/2018 0029   LEUKOCYTESUR SMALL (A) 03/06/2018 0029   Sepsis Labs Invalid input(s): PROCALCITONIN,  WBC,  LACTICIDVEN Microbiology Recent Results (from the past 240 hour(s))  Culture, Urine     Status: Abnormal   Collection Time: 03/06/18 12:29 AM  Result Value Ref Range Status   Specimen Description URINE, CATHETERIZED  Final   Special Requests   Final    NONE Performed at [facility], [address] [place], [place] [address]    Culture (A)  Final    >=100,000 COLONIES/mL STAPHYLOCOCCUS SPECIES (COAGULASE NEGATIVE)   Report Status 03/08/2018 FINAL  Final   Organism ID, Bacteria STAPHYLOCOCCUS SPECIES (COAGULASE NEGATIVE) (A)  Final      Susceptibility   Staphylococcus species (coagulase negative) - MIC*    CIPROFLOXACIN >=8 RESISTANT Resistant     GENTAMICIN <=0.5 SENSITIVE Sensitive     NITROFURANTOIN <=16 SENSITIVE Sensitive     OXACILLIN >=4 RESISTANT Resistant     TETRACYCLINE 2 SENSITIVE Sensitive     VANCOMYCIN 1 SENSITIVE Sensitive     TRIMETH/SULFA 160 RESISTANT Resistant     CLINDAMYCIN >=8 RESISTANT Resistant     RIFAMPIN <=0.5 SENSITIVE Sensitive     Inducible Clindamycin NEGATIVE Sensitive     * >=100,000 COLONIES/mL STAPHYLOCOCCUS SPECIES (COAGULASE NEGATIVE)  MRSA PCR Screening     Status: None   Collection Time: 03/06/18  4:08 PM  Result Value Ref Range Status   MRSA by PCR NEGATIVE NEGATIVE Final    Comment:        The GeneXpert MRSA Assay (FDA approved for NASAL specimens only), is one component of a comprehensive MRSA colonization surveillance program. It is not intended to diagnose MRSA infection nor to guide or monitor treatment for MRSA infections. Performed at [facility], [place] [address], [place], [place] [address]      Time coordinating discharge: 35 mins  SIGNED:   Damita Lack, MD  [facility] 03/10/2018, 12:07 PM Pager   If  7PM-7AM, please contact night-coverage [email or web address] Password [email or web address]

## 2018-03-10 NOTE — Progress Notes (Signed)
RESEARCH ENCOUNTER  Patient ID: Patricia Wagner  DOB: 1950-12-23  [name] presented to the [facility] for the Week 24 visit of the [facility]  No signs/symptoms of ACS since the last visit. IP returned and additional IP dispensed.      [facility] will [facility].  Labs collected during visit:

## 2018-03-11 ENCOUNTER — Inpatient Hospital Stay (HOSPITAL_COMMUNITY): Admission: RE | Admit: 2018-03-11 | Payer: BLUE CROSS/BLUE SHIELD | Source: Ambulatory Visit

## 2018-03-11 ENCOUNTER — Telehealth: Payer: Self-pay

## 2018-03-11 NOTE — Telephone Encounter (Signed)
Transition Care Management Follow-up Telephone Call   Date discharged? 03/10/2018   How have you been since you were released from the hospital? Pt is feeling much better.   Do you understand why you were in the hospital? Yes   Do you understand the discharge instructions? Yes  Where were you discharged to? Home   Items Reviewed:  Medications reviewed: Yes  Allergies reviewed: Yes  Dietary changes reviewed: Yes   Referrals reviewed: Yes  Functional Questionnaire:   Activities of Daily Living (ADLs):    States they are independent in the following: All  States they require assistance with the following: None   Any transportation issues/concerns?: No  Any patient concerns? No  Confirmed importance and date/time of follow-up visits scheduled Yes  Provider Appointment booked with Dr. [name] on Wednesday April 24th, 2019 at 02:40pm  Confirmed with patient if condition begins to worsen call PCP or go to the ER.  Patient was given the office number and encouraged to call back with question or concerns.  :Yes

## 2018-03-15 ENCOUNTER — Encounter (HOSPITAL_COMMUNITY): Payer: Self-pay

## 2018-03-15 ENCOUNTER — Inpatient Hospital Stay (HOSPITAL_COMMUNITY)
Admission: EM | Admit: 2018-03-15 | Discharge: 2018-03-18 | DRG: 378 | Disposition: A | Discharge status: Home / Self Care | Payer: BLUE CROSS/BLUE SHIELD | Attending: Family Medicine | Admitting: Family Medicine

## 2018-03-15 ENCOUNTER — Other Ambulatory Visit (HOSPITAL_COMMUNITY): Payer: Self-pay | Admitting: Internal Medicine

## 2018-03-15 ENCOUNTER — Inpatient Hospital Stay (HOSPITAL_COMMUNITY): Payer: BLUE CROSS/BLUE SHIELD

## 2018-03-15 DIAGNOSIS — I2510 Atherosclerotic heart disease of native coronary artery without angina pectoris: Secondary | ICD-10-CM | POA: Diagnosis present

## 2018-03-15 DIAGNOSIS — Z7901 Long term (current) use of anticoagulants: Secondary | ICD-10-CM

## 2018-03-15 DIAGNOSIS — I9589 Other hypotension: Secondary | ICD-10-CM | POA: Diagnosis not present

## 2018-03-15 DIAGNOSIS — Z87891 Personal history of nicotine dependence: Secondary | ICD-10-CM | POA: Diagnosis not present

## 2018-03-15 DIAGNOSIS — K922 Gastrointestinal hemorrhage, unspecified: Secondary | ICD-10-CM | POA: Diagnosis present

## 2018-03-15 DIAGNOSIS — N179 Acute kidney failure, unspecified: Secondary | ICD-10-CM | POA: Diagnosis present

## 2018-03-15 DIAGNOSIS — R58 Hemorrhage, not elsewhere classified: Secondary | ICD-10-CM

## 2018-03-15 DIAGNOSIS — D696 Thrombocytopenia, unspecified: Secondary | ICD-10-CM | POA: Diagnosis present

## 2018-03-15 DIAGNOSIS — Z90711 Acquired absence of uterus with remaining cervical stump: Secondary | ICD-10-CM

## 2018-03-15 DIAGNOSIS — D62 Acute posthemorrhagic anemia: Secondary | ICD-10-CM | POA: Diagnosis present

## 2018-03-15 DIAGNOSIS — I5022 Chronic systolic (congestive) heart failure: Secondary | ICD-10-CM | POA: Diagnosis not present

## 2018-03-15 DIAGNOSIS — I10 Essential (primary) hypertension: Secondary | ICD-10-CM | POA: Diagnosis present

## 2018-03-15 DIAGNOSIS — I480 Paroxysmal atrial fibrillation: Secondary | ICD-10-CM | POA: Diagnosis not present

## 2018-03-15 DIAGNOSIS — Z6837 Body mass index (BMI) 37.0-37.9, adult: Secondary | ICD-10-CM | POA: Diagnosis not present

## 2018-03-15 DIAGNOSIS — E669 Obesity, unspecified: Secondary | ICD-10-CM | POA: Diagnosis present

## 2018-03-15 DIAGNOSIS — R791 Abnormal coagulation profile: Secondary | ICD-10-CM | POA: Diagnosis present

## 2018-03-15 DIAGNOSIS — D649 Anemia, unspecified: Secondary | ICD-10-CM | POA: Diagnosis not present

## 2018-03-15 DIAGNOSIS — K5731 Diverticulosis of large intestine without perforation or abscess with bleeding: Secondary | ICD-10-CM | POA: Diagnosis not present

## 2018-03-15 DIAGNOSIS — I7300 Raynaud's syndrome without gangrene: Secondary | ICD-10-CM | POA: Diagnosis present

## 2018-03-15 DIAGNOSIS — Z825 Family history of asthma and other chronic lower respiratory diseases: Secondary | ICD-10-CM | POA: Diagnosis not present

## 2018-03-15 DIAGNOSIS — N184 Chronic kidney disease, stage 4 (severe): Secondary | ICD-10-CM | POA: Diagnosis present

## 2018-03-15 DIAGNOSIS — Z8673 Personal history of transient ischemic attack (TIA), and cerebral infarction without residual deficits: Secondary | ICD-10-CM | POA: Diagnosis not present

## 2018-03-15 DIAGNOSIS — Z23 Encounter for immunization: Secondary | ICD-10-CM

## 2018-03-15 DIAGNOSIS — N183 Chronic kidney disease, stage 3 (moderate): Secondary | ICD-10-CM | POA: Diagnosis not present

## 2018-03-15 DIAGNOSIS — D689 Coagulation defect, unspecified: Secondary | ICD-10-CM | POA: Diagnosis present

## 2018-03-15 DIAGNOSIS — Z888 Allergy status to other drugs, medicaments and biological substances status: Secondary | ICD-10-CM

## 2018-03-15 DIAGNOSIS — Z79899 Other long term (current) drug therapy: Secondary | ICD-10-CM | POA: Diagnosis not present

## 2018-03-15 DIAGNOSIS — E785 Hyperlipidemia, unspecified: Secondary | ICD-10-CM | POA: Diagnosis present

## 2018-03-15 DIAGNOSIS — I428 Other cardiomyopathies: Secondary | ICD-10-CM | POA: Diagnosis present

## 2018-03-15 DIAGNOSIS — I130 Hypertensive heart and chronic kidney disease with heart failure and stage 1 through stage 4 chronic kidney disease, or unspecified chronic kidney disease: Secondary | ICD-10-CM | POA: Diagnosis present

## 2018-03-15 DIAGNOSIS — N189 Chronic kidney disease, unspecified: Secondary | ICD-10-CM | POA: Diagnosis present

## 2018-03-15 DIAGNOSIS — I4891 Unspecified atrial fibrillation: Secondary | ICD-10-CM | POA: Diagnosis present

## 2018-03-15 DIAGNOSIS — E870 Hyperosmolality and hypernatremia: Secondary | ICD-10-CM | POA: Diagnosis not present

## 2018-03-15 HISTORY — DX: Long term (current) use of anticoagulants: Z79.01

## 2018-03-15 LAB — CBC
HCT: 26.6 % — ABNORMAL LOW (ref 36.0–46.0)
HCT: 27.1 % — ABNORMAL LOW (ref 36.0–46.0)
HEMATOCRIT: 28.7 % — AB (ref 36.0–46.0)
Hemoglobin: 9 g/dL — ABNORMAL LOW (ref 12.0–15.0)
Hemoglobin: 8.4 g/dL — ABNORMAL LOW (ref 12.0–15.0)
Hemoglobin: 8.7 g/dL — ABNORMAL LOW (ref 12.0–15.0)
MCH: 29.4 pg (ref 26.0–34.0)
MCH: 28.8 pg (ref 26.0–34.0)
MCH: 29.3 pg (ref 26.0–34.0)
MCHC: 31.4 g/dL (ref 30.0–36.0)
MCHC: 31.6 g/dL (ref 30.0–36.0)
MCHC: 32.1 g/dL (ref 30.0–36.0)
MCV: 93.8 fL (ref 78.0–100.0)
MCV: 91.1 fL (ref 78.0–100.0)
MCV: 91.2 fL (ref 78.0–100.0)
PLATELETS: 137 10*3/uL — AB (ref 150–400)
PLATELETS: 135 10*3/uL — AB (ref 150–400)
Platelets: 197 10*3/uL (ref 150–400)
RBC: 3.06 MIL/uL — ABNORMAL LOW (ref 3.87–5.11)
RBC: 2.92 MIL/uL — ABNORMAL LOW (ref 3.87–5.11)
RBC: 2.97 MIL/uL — AB (ref 3.87–5.11)
RDW: 16.9 % — AB (ref 11.5–15.5)
RDW: 16.7 % — AB (ref 11.5–15.5)
RDW: 17.1 % — ABNORMAL HIGH (ref 11.5–15.5)
WBC: 7 10*3/uL (ref 4.0–10.5)
WBC: 5.4 10*3/uL (ref 4.0–10.5)
WBC: 5.5 10*3/uL (ref 4.0–10.5)

## 2018-03-15 LAB — PROTIME-INR
INR: 2.87
PROTHROMBIN TIME: 29.9 s — AB (ref 11.4–15.2)

## 2018-03-15 LAB — APTT: APTT: 33 s (ref 24–36)

## 2018-03-15 LAB — COMPREHENSIVE METABOLIC PANEL
ALBUMIN: 3 g/dL — AB (ref 3.5–5.0)
ALT: 12 U/L — AB (ref 14–54)
AST: 13 U/L — AB (ref 15–41)
Alkaline Phosphatase: 37 U/L — ABNORMAL LOW (ref 38–126)
Anion gap: 6 (ref 5–15)
BUN: 30 mg/dL — AB (ref 6–20)
CHLORIDE: 104 mmol/L (ref 101–111)
CO2: 28 mmol/L (ref 22–32)
CREATININE: 2.2 mg/dL — AB (ref 0.44–1.00)
Calcium: 8.6 mg/dL — ABNORMAL LOW (ref 8.9–10.3)
GFR calc Af Amer: 26 mL/min — ABNORMAL LOW (ref >60)
GFR, EST NON AFRICAN AMERICAN: 22 mL/min — AB (ref >60)
GLUCOSE: 94 mg/dL (ref 65–99)
Potassium: 4.2 mmol/L (ref 3.5–5.1)
Sodium: 138 mmol/L (ref 135–145)
Total Bilirubin: 0.5 mg/dL (ref 0.3–1.2)
Total Protein: 5.7 g/dL — ABNORMAL LOW (ref 6.5–8.1)

## 2018-03-15 LAB — PREPARE RBC (CROSSMATCH)

## 2018-03-15 MED ORDER — PROTHROMBIN COMPLEX CONC HUMAN 500 UNITS IV KIT
5170.0000 [IU] | PACK | Status: AC
  Administered 2018-03-15: 5170 [IU] via INTRAVENOUS
  Filled 2018-03-15: qty 5170

## 2018-03-15 MED ORDER — SODIUM CHLORIDE 0.9 % IV SOLN
10.0000 mL/h | Freq: Once | INTRAVENOUS | Status: AC
  Administered 2018-03-15: 10 mL/h via INTRAVENOUS

## 2018-03-15 MED ORDER — TECHNETIUM TC 99M-LABELED RED BLOOD CELLS IV KIT
25.0000 | PACK | Freq: Once | INTRAVENOUS | Status: AC | PRN
  Administered 2018-03-15: 25 via INTRAVENOUS

## 2018-03-15 MED ORDER — ROSUVASTATIN CALCIUM 10 MG PO TABS
10.0000 mg | ORAL_TABLET | Freq: Every day | ORAL | Status: DC
  Administered 2018-03-16 – 2018-03-17 (×2): 10 mg via ORAL
  Filled 2018-03-15 (×3): qty 1

## 2018-03-15 MED ORDER — ACETAMINOPHEN 650 MG RE SUPP: 650.0000 mg | Freq: Four times a day (QID) | RECTAL | Status: DC | PRN

## 2018-03-15 MED ORDER — AMIODARONE HCL 200 MG PO TABS
200.0000 mg | ORAL_TABLET | Freq: Every day | ORAL | Status: DC
Start: 2018-03-15 — End: 2018-03-18
  Administered 2018-03-15 – 2018-03-18 (×4): 200 mg via ORAL
  Filled 2018-03-15 (×4): qty 1

## 2018-03-15 MED ORDER — ACETAMINOPHEN 325 MG PO TABS
650.0000 mg | ORAL_TABLET | Freq: Four times a day (QID) | ORAL | Status: DC | PRN
  Administered 2018-03-18: 650 mg via ORAL
  Filled 2018-03-15: qty 2

## 2018-03-15 MED ORDER — ONDANSETRON HCL 4 MG/2ML IJ SOLN
4.0000 mg | Freq: Four times a day (QID) | INTRAMUSCULAR | Status: DC | PRN
Start: 2018-03-15 — End: 2018-03-18
  Filled 2018-03-15: qty 2

## 2018-03-15 MED ORDER — SODIUM CHLORIDE 0.9 % IV SOLN
Freq: Once | INTRAVENOUS | Status: AC
  Administered 2018-03-15: 10:00:00 via INTRAVENOUS

## 2018-03-15 MED ORDER — VITAMIN K1 10 MG/ML IJ SOLN
5.0000 mg | Freq: Once | INTRAVENOUS | Status: AC
  Administered 2018-03-15: 5 mg via INTRAVENOUS
  Filled 2018-03-15: qty 0.5

## 2018-03-15 MED ORDER — RANOLAZINE ER 500 MG PO TB12
500.0000 mg | ORAL_TABLET | Freq: Two times a day (BID) | ORAL | Status: DC
  Administered 2018-03-15 – 2018-03-18 (×6): 500 mg via ORAL
  Filled 2018-03-15 (×8): qty 1

## 2018-03-15 MED ORDER — ONDANSETRON HCL 4 MG PO TABS: 4.0000 mg | ORAL_TABLET | Freq: Four times a day (QID) | ORAL | Status: DC | PRN

## 2018-03-15 MED ORDER — SODIUM CHLORIDE 0.9% FLUSH
3.0000 mL | Freq: Two times a day (BID) | INTRAVENOUS | Status: DC
  Administered 2018-03-15 – 2018-03-16 (×3): 3 mL via INTRAVENOUS

## 2018-03-15 MED ORDER — LACTATED RINGERS IV SOLN: INTRAVENOUS | Status: DC

## 2018-03-15 NOTE — ED Notes (Signed)
Pt states she was laying in bed and thought she had to have a bowel movement. Pt states she went to the bathroom and passed blood with blood clots.

## 2018-03-15 NOTE — ED Notes (Signed)
Per pharmacy and MD, Eppie Gibson to be given before vitamin K Critical care NP and Hospitalist bedside for evaluation at this time

## 2018-03-15 NOTE — ED Triage Notes (Signed)
Pt coming from home by ems due to rectal bleeding while using the bathroom. Pt had a toilet bowl full with massive clots in the toilet. Pt states this started about 45 minutes ago. Pt was just recently seen last week and had warfarin changed to xlerato.pt was given blood when seen. Pt axox4

## 2018-03-15 NOTE — Consult Note (Signed)
[facility] Consult: 8:16 AM 03/15/2018  LOS: 0 days    Referring Provider: Dr Leonides Schanz  Primary Care Physician:  Biagio Borg, MD Primary Gastroenterologist:  Dr. Fuller Plan    Reason for Consultation:  LGI bleed, hematochezia.     HPI: Patricia Wagner is a 67 y.o. female.  Hx Afib, failed/recurred post multiple CVs, on Coumadin. CVA 2017.  NICM (06/2017 EF 30 to 35%).  Non-obstructive CAD, cath 11/207.  Cardiogenic shock in setting of persistent A fib 04/2017 CKD stage 3-4. Chronic LE edema.  Suspected OSA. Thrombocytopenia dating to 2017, platelets 95 in 04/2017.  OSA? Sleep studies in planning stage.   2001 Colonoscopy.  Scattered tics.  Random bx: unremarkable 02/2011 Colonoscopy.  Avg risk screening.  Scattered pan diverticulosis.  5 mm sessile, sigmoid polyp (TA, no HGD).  Admitted 11/2017 with obstructive uropathy, AKI, hematuria, coagulopathy (INR> 10), thrombocytopenia (113).  Anemia with Hgb 7.4, and small volume blood per rectum, no clear CGE.  Transfused 2 U PRBC. Colonoscopy 11/2017: multiple small and large tics throughout.     Admitted 03/05/18 - 03/10/18 with INR > 10, AKI, tarry/dark stools, coag neg staph UTI, no NV or abd pain.  Transfused 1 U PRBC, Coumadin reversed with Vit K and K centra. Bleeding attributed to spontaneous oozing in stomach/bowel in setting of coagulopathy and she was not scoped.  Xarelto replaced Coumadin at discharge.   She now has and indwelling foley.    Now presenting to ED.  Episode of large volume, painless hematochezia started 2 AM today.  Several stools since arrival to ED, current/blackberry jelly, clotted in appearance.  Cramping at time of BMs, o/w no abd pain.  No N/V.  Not dizzy, light headed, no palpitations, no SOB, actually feels well.  Receiving K centra, Vit K, RBCs, FFP.     Hgb is 9 (8.3 on 4/17).  Platelets 197.  AKI.  INR 2.8. BP nadir of 82/57, now up to 90s/60s.  No tachycardia.      Fm Hx + CAD, her twin sister has ESRD and is on dialysis.  No hx cancers, PUD, anemia, GIB.      Past Medical History:  Diagnosis Date  . Arthritis   . Atrial fibrillation ([place])    a. s/p multiple cardioversions; failed tikosyn/sotalol.  Marland Kitchen CAD in native artery, s/p cardiac cath with non obstructive CAD 10/24/2016  . CHF (congestive heart failure) ([place])   . CKD (chronic kidney disease) stage 3, GFR 30-59 ml/min ([facility])   . DIVERTICULITIS, HX OF 07/25/2007  . DIVERTICULOSIS, COLON 07/22/2007  . Dyspnea   . Edema, peripheral    a. chronic BLE edema, R>L. Prior trauma from dog attack and accident.  Marland Kitchen HYPERLIPIDEMIA 02/03/2008  . Hypersomnia    declines w/u  . Hypertension   . MENOPAUSAL DISORDER 01/09/2011  . Morbid obesity ([place]) 07/22/2007  . NICM (nonischemic cardiomyopathy) ([place]) 10/24/2016  . Raynaud's syndrome 07/22/2007  . Stroke ([place]) 2017  . THYROID NODULE, RIGHT 01/04/2010  . VITAMIN D DEFICIENCY 01/09/2011   Qualifier: Diagnosis of  By: [name], [name]     Past Surgical History:  Procedure Laterality Date  . CARDIAC CATHETERIZATION N/A 10/23/2016   Procedure: Left Heart Cath and Coronary Angiography;  Surgeon: [name], MD;  Location: [place] [facility];  Service: Cardiovascular;  Laterality: N/A;  . CARDIOVERSION N/A 10/31/2016   Procedure: CARDIOVERSION;  Surgeon: [name], MD;  Location: [place];  Service: Cardiovascular;  Laterality: N/A;  . CARDIOVERSION N/A 11/03/2016   Procedure: CARDIOVERSION;  Surgeon: [name], MD;  Location: [place];  Service: Cardiovascular;  Laterality: N/A;  . CARDIOVERSION N/A 11/18/2016   Procedure: CARDIOVERSION;  Surgeon: [name], MD;  Location: [place];  Service: Cardiovascular;  Laterality: N/A;  . CARDIOVERSION N/A 03/25/2017   Procedure: CARDIOVERSION;  Surgeon: [name], MD;   Location: [facility] ENDOSCOPY;  Service: Cardiovascular;  Laterality: N/A;  . CARDIOVERSION N/A 05/06/2017   Procedure: CARDIOVERSION;  Surgeon: [name], MD;  Location: [facility] ENDOSCOPY;  Service: Cardiovascular;  Laterality: N/A;  . CARDIOVERSION N/A 05/12/2017   Procedure: CARDIOVERSION;  Surgeon: [name], MD;  Location: [facility] ENDOSCOPY;  Service: Cardiovascular;  Laterality: N/A;  . COLONOSCOPY N/A 12/04/2017   Procedure: COLONOSCOPY;  Surgeon: [name], MD;  Location: [place];  Service: Endoscopy;  Laterality: N/A;  . COLONOSCOPY W/ POLYPECTOMY  02/2011   pan diverticulosis.  tubular adenoma without dysplasia on 5 mm sigmoid polyp.  Dr Fuller Plan.    Marland Kitchen PARTIAL HYSTERECTOMY     1 OVARY LEFT  . TEE WITHOUT CARDIOVERSION N/A 10/31/2016   Procedure: TRANSESOPHAGEAL ECHOCARDIOGRAM (TEE);  Surgeon: [name], MD;  Location: [facility] ENDOSCOPY;  Service: Cardiovascular;  Laterality: N/A;  . TEE WITHOUT CARDIOVERSION N/A 03/25/2017   Procedure: TRANSESOPHAGEAL ECHOCARDIOGRAM (TEE);  Surgeon: [name], MD;  Location: [facility] ENDOSCOPY;  Service: Cardiovascular;  Laterality: N/A;    Prior to Admission medications   Medication Sig Start Date End Date Taking? Authorizing Provider  amiodarone (PACERONE) 200 MG tablet Take 1 tablet (200 mg total) by mouth daily. 03/11/18  Yes Amin, Jeanella Flattery, MD  atorvastatin (LIPITOR) 40 MG tablet Take 1 tablet (40 mg total) by mouth daily at 6 PM. 03/10/18  Yes Amin, Ankit Chirag, MD  carvedilol (COREG) 3.125 MG tablet Take 1 tablet (3.125 mg total) by mouth 2 (two) times daily. 07/09/17 03/15/26 Yes [name], Denice Bors, MD  isosorbide-hydrALAZINE (BIDIL) 20-37.5 MG tablet Take 1 tablet by mouth 3 (three) times daily. Patient taking differently: Take 2 tablets by mouth 3 (three) times daily.  10/20/17  Yes [name], MD  potassium chloride (K-DUR) 10 MEQ tablet Take 2 tablets (20 mEq total) by mouth daily. 03/10/18  Yes Amin, Jeanella Flattery, MD   ranolazine (RANEXA) 500 MG 12 hr tablet Take 1 tablet (500 mg total) by mouth 2 (two) times daily. 09/14/17  Yes Bensimhon, Shaune Pascal, MD  rivaroxaban (XARELTO) 20 MG TABS tablet Take 1 tablet (20 mg total) by mouth daily with supper. 03/10/18  Yes Amin, Ankit Chirag, MD  rosuvastatin (CRESTOR) 10 MG tablet Take 10 mg by mouth daily.   Yes [provider]  sacubitril-valsartan (ENTRESTO) 24-26 MG Take 1 tablet by mouth 2 (two) times daily.   Yes [provider]  Investigational - Study Medication Take 1 tablet by mouth 2 (two) times daily. Study name: Galactic HF Study  Additional study details: Omecamtiv Mecarbil or Placebo Patient not taking: Reported on 03/15/2018 09/17/17   Larey Dresser, MD  Scheduled Meds:  Infusions: . sodium chloride    . sodium chloride    . phytonadione (VITAMIN K) IV    . prothrombin complex conc human (Kcentra) IVPB     PRN Meds:    Allergies as of 03/15/2018 - Review Complete 03/15/2018  Allergen Reaction Noted  . Ace inhibitors Palpitations 03/20/2011    Family History  Problem Relation Age of Onset  . Asthma Mother   . Diabetes Father   . Heart disease Father        Died of presumed heart attack - 34s  . Lung disease Sister   . Heart disease Sister        Twin sister has heart issue, unclear what kind  . Thyroid disease Neg Hx     Social History   Socioeconomic History  . Marital status: Single    Spouse name: Not on file  . Number of children: 1  . Years of education: Not on file  . Highest education level: Not on file  Occupational History  . Occupation: Engineer, manufacturing  . Financial resource strain: Not on file  . Food insecurity:    Worry: Not on file    Inability: Not on file  . Transportation needs:    Medical: Not on file    Non-medical: Not on file  Tobacco Use  . Smoking status: Former Smoker    Packs/day: 0.50    Types: Cigarettes    Last attempt to quit: 10/23/2016    Years since  quitting: 1.3  . Smokeless tobacco: Never Used  . Tobacco comment: Smoked for 50 years  Substance and Sexual Activity  . Alcohol use: No    Alcohol/week: 2.4 oz    Types: 4 Cans of beer per week    Frequency: Never    Comment: Intermittent  . Drug use: No  . Sexual activity: Not on file  Lifestyle  . Physical activity:    Days per week: Not on file    Minutes per session: Not on file  . Stress: Not on file  Relationships  . Social connections:    Talks on phone: Not on file    Gets together: Not on file    Attends religious service: Not on file    Active member of club or organization: Not on file    Attends meetings of clubs or organizations: Not on file    Relationship status: Not on file  . Intimate partner violence:    Fear of current or ex partner: Not on file    Emotionally abused: Not on file    Physically abused: Not on file    Forced sexual activity: Not on file  Other Topics Concern  . Not on file  Social History Narrative  . Not on file    REVIEW OF SYSTEMS: Constitutional:  No weakness ENT:  No nose bleeds Pulm:  No SOB CV:  No palpitations, no CP.  LE edema improved  GU:  No hematuria, no frequency GI:  Per HPI Heme:  Per HPI   Transfusions:  Per HPI  Neuro:  No headaches, no peripheral tingling or numbness Derm:  No itching, no rash or sores.  Endocrine:  No sweats or chills.  No polyuria or dysuria Immunization:  reviewed Travel:  None beyond local counties in last few months.    PHYSICAL EXAM: Vital signs in last 24 hours: Vitals:   03/15/18 0730 03/15/18 0806  BP: (!) 87/59 (!) 82/57  Pulse:  75 77  Resp: 19 20  Temp:  98.7 F (37.1 C)  SpO2: 99%    Wt Readings from Last 3 Encounters:  03/15/18 225 lb (102.1 kg)  03/10/18 224 lb 13.9 oz (102 kg)  03/04/18 242 lb 1.9 oz (109.8 kg)    General: pleasant, comfortable, alert.  Looks well Head:  No asymmetry or swelling.  No trauma.  Eyes:  No palor.   Ears:  Not HOH  Nose:  No  discharge Mouth:  Moist, pink oral MM.  Good teeth.  Tongue midline Neck:  No JVD, no TMG Lungs:  Clear Bil.  No SOB or cough Heart: RRR.  No MRG.  s1 s2 present Abdomen:  Soft, NT, ND.  Active BS.  No mass, hernia, HSM, bruits.   Rectal: copious amount of liquid deep red stool with multiple clots and small pieces of brown stool between thighs.  This does not smell like melena.  .     Musc/Skeltl: no joint swelling or contractures Extremities:  Non-pitting LE edema.    Neurologic:  Oriented x 3.  No tremor.  Moves all 4 limbs, strengthe not tested.  No speech deficits.   Skin:  No sores or rashes Tattoos:  none Nodes:  No cervical adenopathy.     Psych:  Pleasant, cooperative, calm.    Intake/Output from previous day: No intake/output data recorded. Intake/Output this shift: Total I/O In: 315 [Blood:315] Out: -   LAB RESULTS: Recent Labs    03/15/18 0536  WBC 7.0  HGB 9.0*  HCT 28.7*  PLT 197   BMET Lab Results  Component Value Date   NA 138 03/15/2018   NA 142 03/10/2018   NA 141 03/09/2018   K 4.2 03/15/2018   K 3.7 03/10/2018   K 3.7 03/09/2018   CL 104 03/15/2018   CL 108 03/10/2018   CL 109 03/09/2018   CO2 28 03/15/2018   CO2 28 03/10/2018   CO2 26 03/09/2018   GLUCOSE 94 03/15/2018   GLUCOSE 92 03/10/2018   GLUCOSE 100 (H) 03/09/2018   BUN 30 (H) 03/15/2018   BUN 21 (H) 03/10/2018   BUN 33 (H) 03/09/2018   CREATININE 2.20 (H) 03/15/2018   CREATININE 1.53 (H) 03/10/2018   CREATININE 1.67 (H) 03/09/2018   CALCIUM 8.6 (L) 03/15/2018   CALCIUM 8.0 (L) 03/10/2018   CALCIUM 7.9 (L) 03/09/2018   LFT Recent Labs    03/15/18 0536  PROT 5.7*  ALBUMIN 3.0*  AST 13*  ALT 12*  ALKPHOS 37*  BILITOT 0.5   PT/INR Lab Results  Component Value Date   INR 2.87 03/15/2018   INR 1.15 03/06/2018   INR 1.17 03/06/2018     RADIOLOGY STUDIES: No results found.   IMPRESSION:   *  Recurrent GIB, sounds diverticular in nature.  Colonoscopy in Jan  confirmed pan diverticulosis.   *   Chronic anti-coagulation, now on Xarelto.  To get K centra, Vitamin K, FFP  *  Anemia.  Current Hgb not as bad as previously.  1 U PRBC transfusing currently.    *  AKI recurrent, baseline CKD.    *  CHF, NICM.  On a study vs placebo med for this.      PLAN:     *  Supportive care.  CCM involved in care now as Optometrist.   D/w Dr Hilarie Fredrickson, observe for now.  Consider nuc med RBC scan if continues to bleed, however she is not a likely candidate for  angiography given kidney disease, so a positive bleeding scan would be a formality rather than of any clinical benefit.  For same reason, kidneys, she is not a CT angio candidate.  .   Is it time to reconsider risk/benefits of anticoagulation?    Azucena Freed  03/15/2018, 8:16 AM Phone [phone]

## 2018-03-15 NOTE — H&P (Addendum)
History and Physical    TALIN ROZEBOOM PQZ:[id number] DOB: [birth year] DOA: 03/15/2018  PCP: Biagio Borg, MD Consultants:  [facility] - cardiology; Henrene Pastor - GI Patient coming from:  Home - lives alone; NOK: Daughter  Chief Complaint: BRBPR  HPI: Patricia Wagner is a 67 y.o. female with medical history significant of CVA; Raynaud's syndrome; HTN; HLD; CAD; afib on Xarelto; and CHF (EF 30-35% in 8/18) and recent admission for diverticular bleed returning with the same.  During prior admission, she had more subtle blood loss but had INR >10 on Coumadin.  She was given [facility] and changed from Coumadin to Xarelto at the time of discharge.  She began with dark bloody stools with clots about 0230 this AM and finally decided to come to the ER about 0400.  She is continuing to have marked BRBPR with clots.  No abdominal pain.  No n/v.  No fevers.    ED Course:  Rectal bleeding - has lost maybe 800cc BRBPR this AM.  Anticoagulated with Xarelto (changed from Coumadin recently).  She is being given 1 unit FFP and vitamin K.  BP 104/69, no tachycardia.  H/o diverticulosis.  Dr. Hilarie Fredrickson consulted and will see patient.  BP dropped further to 33L systolic and so 2 units ordered urgently and she was also given KCentra.  PCCM was consulted.  Review of Systems: As per HPI; otherwise review of systems reviewed and negative.   Ambulatory Status:  Ambulates without assistance  Past Medical History:  Diagnosis Date  . Arthritis   . Atrial fibrillation ([place])    a. s/p multiple cardioversions; failed tikosyn/sotalol.  Marland Kitchen CAD in native artery, s/p cardiac cath with non obstructive CAD 10/24/2016  . CHF (congestive heart failure) ([place])   . CKD (chronic kidney disease) stage 3, GFR 30-59 ml/min (HCC)   . DIVERTICULITIS, HX OF 07/25/2007  . DIVERTICULOSIS, COLON 07/22/2007  . Dyspnea   . Edema, peripheral    a. chronic BLE edema, R>L. Prior trauma from dog attack and accident.  Marland Kitchen HYPERLIPIDEMIA 02/03/2008  . Hypersomnia    declines w/u  . Hypertension   . MENOPAUSAL DISORDER 01/09/2011  . Morbid obesity ([place]) 07/22/2007  . NICM (nonischemic cardiomyopathy) ([place]) 10/24/2016  . Raynaud's syndrome 07/22/2007  . Stroke ([place]) 2017  . THYROID NODULE, RIGHT 01/04/2010  . VITAMIN D DEFICIENCY 01/09/2011   Qualifier: Diagnosis of  By: [name], [name]     Past Surgical History:  Procedure Laterality Date  . CARDIAC CATHETERIZATION N/A 10/23/2016   Procedure: Left Heart Cath and Coronary Angiography;  Surgeon: [name], MD;  Location: [facility];  Service: Cardiovascular;  Laterality: N/A;  . CARDIOVERSION N/A 10/31/2016   Procedure: CARDIOVERSION;  Surgeon: [name], MD;  Location: [facility];  Service: Cardiovascular;  Laterality: N/A;  . CARDIOVERSION N/A 11/03/2016   Procedure: CARDIOVERSION;  Surgeon: [name], MD;  Location: [place];  Service: Cardiovascular;  Laterality: N/A;  . CARDIOVERSION N/A 11/18/2016   Procedure: CARDIOVERSION;  Surgeon: [name], MD;  Location: [facility] ENDOSCOPY;  Service: Cardiovascular;  Laterality: N/A;  . CARDIOVERSION N/A 03/25/2017   Procedure: CARDIOVERSION;  Surgeon: [name], MD;  Location: [facility] ENDOSCOPY;  Service: Cardiovascular;  Laterality: N/A;  . CARDIOVERSION N/A 05/06/2017   Procedure: CARDIOVERSION;  Surgeon: [name], MD;  Location: [facility] ENDOSCOPY;  Service: Cardiovascular;  Laterality: N/A;  . CARDIOVERSION N/A 05/12/2017   Procedure: CARDIOVERSION;  Surgeon: [name], MD;  Location: [facility]  ENDOSCOPY;  Service: Cardiovascular;  Laterality: N/A;  . COLONOSCOPY N/A 12/04/2017   Procedure: COLONOSCOPY;  Surgeon: [name], MD;  Location: [facility] ENDOSCOPY;  Service: Endoscopy;  Laterality: N/A;  . COLONOSCOPY W/ POLYPECTOMY  02/2011   pan diverticulosis.  tubular adenoma without dysplasia on 5 mm sigmoid polyp.  Dr Fuller Plan.    Marland Kitchen PARTIAL HYSTERECTOMY     1 OVARY LEFT  . TEE WITHOUT CARDIOVERSION N/A 10/31/2016    Procedure: TRANSESOPHAGEAL ECHOCARDIOGRAM (TEE);  Surgeon: [name], MD;  Location: [facility] ENDOSCOPY;  Service: Cardiovascular;  Laterality: N/A;  . TEE WITHOUT CARDIOVERSION N/A 03/25/2017   Procedure: TRANSESOPHAGEAL ECHOCARDIOGRAM (TEE);  Surgeon: [name], MD;  Location: [facility] ENDOSCOPY;  Service: Cardiovascular;  Laterality: N/A;    Social History   Socioeconomic History  . Marital status: Single    Spouse name: Not on file  . Number of children: 1  . Years of education: Not on file  . Highest education level: Not on file  Occupational History  . Occupation: Engineer, manufacturing  . Financial resource strain: Not on file  . Food insecurity:    Worry: Not on file    Inability: Not on file  . Transportation needs:    Medical: Not on file    Non-medical: Not on file  Tobacco Use  . Smoking status: Former Smoker    Packs/day: 0.50    Types: Cigarettes    Last attempt to quit: 10/23/2016    Years since quitting: 1.3  . Smokeless tobacco: Never Used  . Tobacco comment: Smoked for 50 years  Substance and Sexual Activity  . Alcohol use: No    Alcohol/week: 2.4 oz    Types: 4 Cans of beer per week    Frequency: Never    Comment: Intermittent  . Drug use: No  . Sexual activity: Not on file  Lifestyle  . Physical activity:    Days per week: Not on file    Minutes per session: Not on file  . Stress: Not on file  Relationships  . Social connections:    Talks on phone: Not on file    Gets together: Not on file    Attends religious service: Not on file    Active member of club or organization: Not on file    Attends meetings of clubs or organizations: Not on file    Relationship status: Not on file  . Intimate partner violence:    Fear of current or ex partner: Not on file    Emotionally abused: Not on file    Physically abused: Not on file    Forced sexual activity: Not on file  Other Topics Concern  . Not on file  Social History Narrative  . Not on file     Allergies  Allergen Reactions  . Ace Inhibitors Palpitations    Family History  Problem Relation Age of Onset  . Asthma Mother   . Diabetes Father   . Heart disease Father        Died of presumed heart attack - 40s  . Lung disease Sister   . Heart disease Sister        Twin sister has heart issue, unclear what kind  . Thyroid disease Neg Hx     Prior to Admission medications   Medication Sig Start Date End Date Taking? Authorizing Provider  amiodarone (PACERONE) 200 MG tablet Take 1 tablet (200 mg total) by mouth daily. 03/11/18  Yes Amin,  Ankit Chirag, MD  atorvastatin (LIPITOR) 40 MG tablet Take 1 tablet (40 mg total) by mouth daily at 6 PM. 03/10/18  Yes Amin, Ankit Chirag, MD  carvedilol (COREG) 3.125 MG tablet Take 1 tablet (3.125 mg total) by mouth 2 (two) times daily. 07/09/17 03/15/26 Yes [name], Denice Bors, MD  isosorbide-hydrALAZINE (BIDIL) 20-37.5 MG tablet Take 1 tablet by mouth 3 (three) times daily. Patient taking differently: Take 2 tablets by mouth 3 (three) times daily.  10/20/17  Yes [name], MD  potassium chloride (K-DUR) 10 MEQ tablet Take 2 tablets (20 mEq total) by mouth daily. 03/10/18  Yes Amin, Jeanella Flattery, MD  ranolazine (RANEXA) 500 MG 12 hr tablet Take 1 tablet (500 mg total) by mouth 2 (two) times daily. 09/14/17  Yes Bensimhon, Shaune Pascal, MD  rivaroxaban (XARELTO) 20 MG TABS tablet Take 1 tablet (20 mg total) by mouth daily with supper. 03/10/18  Yes Amin, Ankit Chirag, MD  rosuvastatin (CRESTOR) 10 MG tablet Take 10 mg by mouth daily.   Yes [provider]  sacubitril-valsartan (ENTRESTO) 24-26 MG Take 1 tablet by mouth 2 (two) times daily.   Yes [provider]  Investigational - Study Medication Take 1 tablet by mouth 2 (two) times daily. Study name: Galactic HF Study  Additional study details: Omecamtiv Mecarbil or Placebo Patient not taking: Reported on 03/15/2018 09/17/17   Larey Dresser, MD    Physical Exam: Vitals:    03/15/18 0730 03/15/18 0806 03/15/18 0821 03/15/18 0822  BP: (!) 87/59 (!) 82/57 97/67 97/67   Pulse: 75 77 76 76  Resp: 19 20 20 20   Temp:  98.7 F (37.1 C) 98.8 F (37.1 C) 98.8 F (37.1 C)  TempSrc:  Oral Oral Oral  SpO2: 99%   100%  Weight:      Height:         General:  Appears calm and comfortable and is NAD Eyes:  PERRL, EOMI, normal lids, iris ENT:  grossly normal hearing, lips & tongue, mmm Neck:  no LAD, masses or thyromegaly Cardiovascular:  RRR, no m/r/g. No LE edema.  Respiratory:   CTA bilaterally with no wheezes/rales/rhonchi.  Normal respiratory effort. Abdomen:  soft, NT, ND, NABS Skin:  no rash or induration seen on limited exam Musculoskeletal:  grossly normal tone BUE/BLE, good ROM, no bony abnormality Psychiatric:  grossly normal mood and affect, speech fluent and appropriate, AOx3 Neurologic:  CN 2-12 grossly intact, moves all extremities in coordinated fashion, sensation intact    Radiological Exams on Admission: No results found.  EKG: Independently reviewed.  NSR with rate 67;  no evidence of acute ischemia   Labs on Admission: I have personally reviewed the available labs and imaging studies at the time of the admission.  Pertinent labs:   BUN 30/Creatinine 2.20/GFR 26; prior 21/1.53/40 on 4/17 Albumin 3.0 Hgb 9.0; prior 8.3 on 4/17 INR 2.87  Assessment/Plan Principal Problem:   Lower GI bleed Active Problems:   HLD (hyperlipidemia)   Essential hypertension   Diverticulosis of colon with hemorrhage   Acute renal failure superimposed on chronic kidney disease (HCC)   PAF (paroxysmal atrial fibrillation) (HCC)   NICM (nonischemic cardiomyopathy) (HCC)   Chronic systolic (congestive) heart failure (HCC)   Acute blood loss anemia   Chronic anticoagulation   Lower GI bleed with h/o diverticulosis -Given h/o know pandiverticular disease (last colonoscopy in 1/19 due to diverticular bleed then too), this is likely to be a diverticular  bleed.  However, given the  rapidity of the bleeding and presence of clots, AVM is also a consideration. -GI has been consulted. -Will order tagged RBC scan to see if the source can be identified; GI or IR may be able to stop the bleeding if it does not resolve with reversal of anticoagulation. -She is afebrile at this time with tachycardia, and no leukocytosis; will not give antibiotics at this time.  -Inpatient admission -CBC q6h; transfuse for Hgb <7 and prn ongoing brisk bleeding -Continue to monitory for recurrent bleeding   Acute blood loss anemia with chronic anticoagulation -While patient's initial Hgb was actually higher than prior, suspect that this was hemoconcentrated due to acute blood loss -She has continued to experience large volume blood loss while in the ER with resultant hypotension -PCCM consulted; she does not appear to have acute indication for admission to ICU at this time but it will be helpful to have them on board in case she becomes unstable -Will admit to SDU -Actively transfused 2 units PRBC to start as well as FFP, vitamin K (unlikely to be helpful but also unlikely harmful so this was continued), and KCentra. -Hopefully, this reversal of Xarelto will result in stabilization of the bleeding. -Further anticoagulation needs to be carefully considered, as the risks appear to be greater than the benefits at this time.  Acute renal failure on stage 3 CKD -Baseline appears to be about 1.5 -Suspect that acute component is related to volume deficiency in the setting of acute blood loss -Will be transfusing and providing significant other fluids and will start LR for when patient is not being actively transfused -Continue to follow BMP -Of note, she did have an obstructive uropathy treated during her prior hospitalization  Afib on Xarelto -Rate controlled with Amiodarone (continued) and Coreg (held in the setting of hypotension and active bleeding) -h/o DCCV 03/25/17 and  also 6/13 and 6/19; "she absolutely cannot tolerate AF with recurrent cardiogenic shock with AF recurs" -CHA2DS2-VASc score is 5, with an estimated 6.7% stroke rate/year -Suggest discontinuation of long-term AC and instead ASA 325 mg daily, but will defer to day team at time of discharge -This is complicated by a h/o CVA in 2017 while on Eliquis -Off anticoagulation for now -Continue Ranexa  HTN -Hold Coreg, Bidil, Entresto  HLD -Patient was taking both Lipitor and Crestor -It is not entirely clear which one she is supposed to be taking -For now, will stop Lipitor and continue Crestor only  NICM with CHF -Echo 03/06/18 with EF 40-45% and grade 1 diastolic dysfunction -Appears to be euvolemic, but will need to be observant given the need for ongoing volume/blood resuscitation -Holding BP meds including Imdur and Entresto at this time -Consider CHF team consult prior to d/c but does not appear to be needed acutely  Note: prior concern for underlying OSA with noted nocturnal hypoxia.  Continue 2L [place] O2 qhs - although perhaps related to persistent GI bleeding.  Continue to monitor throughout hospitalization to see if this is needed.    DVT prophylaxis:  SCDs Code Status:  Full - confirmed with patient Family Communication: None present; offered to call her daughter and the patient became emotional and declined  Disposition Plan:  Home once clinically improved Consults called: GI; PCCM  Admission status: Admit - It is my clinical opinion that admission to INPATIENT is reasonable and necessary because of the expectation that this patient will require hospital care that crosses at least 2 midnights to treat this condition based on the medical complexity  of the problems presented.  Given the aforementioned information, the predictability of an adverse outcome is felt to be significant.    Total critical care time: 65 minutes Critical care time was exclusive of separately billable procedures  and treating other patients. Critical care was necessary to treat or prevent imminent or life-threatening deterioration. Critical care was time spent personally by me on the following activities: development of treatment plan with patient and/or surrogate as well as nursing, discussions with consultants, evaluation of patient's response to treatment, examination of patient, obtaining history from patient or surrogate, ordering and performing treatments and interventions, ordering and review of laboratory studies, ordering and review of radiographic studies, pulse oximetry and re-evaluation of patient's condition.    Karmen Bongo MD [facility]  If note is complete, please contact covering daytime or nighttime physician. [email or web address] Password [facility]  03/15/2018, 9:18 AM

## 2018-03-15 NOTE — ED Notes (Signed)
Pt returned from nuclear medicine at this time

## 2018-03-15 NOTE — Consult Note (Signed)
Name: Patricia Wagner MRN: [id number] DOB: 10/06/1951    ADMISSION DATE:  03/15/2018 CONSULTATION DATE:  4/22  REFERRING MD :  Dr. Leonides Schanz  CHIEF COMPLAINT:  GIB  HISTORY OF PRESENT ILLNESS:   67 year old female with PMH of Afib on Xarelto, NICM, biventricular systolic HF, diverticulosis, HTN, HLD, chronic urinary retention with current indwelling foley, CKD, and Reynauds presenting with acute bloody stools from home.   Recently hospitalized 4/12- 4/17 for presumed diverticular bleed and supratherapeutic INR while on coumadin.  GI and cardiology followed patient, symptoms subsided after reversing her INR and she was cleared to be discharged home on Xarelto and follow up with GI as outpatient.  Patient reports she was in her normal state of health when she had an episode of bowel incontinence this morning around 2 am followed by a large amount of bright red blood with large clots in the toilet while awaiting EMS.  She reports only mild abdominal cramping with passing of stool, no nausea or vomiting, SOB, or chest pain.  Denies feeling near syncopal or syncopal since this started.  Last dose of Xarelto yesterday.  In ER, patient noted with hematochezia, INR 2.87, Hgb 9, PLTs 197.  She was initially normotensive but had episode of asymptomatic hypotension which has since resolved; other vital signs have been stable.  Treatment started with Kcentra and first infusion of PRBC to be followed with second unit of PRBC, vitamin K, and FFP.  PCCM consulted in concern for hematochezia with hypotension.  TRH to admit to SDU for close monitoring.   PAST MEDICAL HISTORY :   has a past medical history of Arthritis, Atrial fibrillation ([place]), CAD in native artery, s/p cardiac cath with non obstructive CAD (10/24/2016), CHF (congestive heart failure) ([place]), CKD (chronic kidney disease) stage 3, GFR 30-59 ml/min ([place]), DIVERTICULITIS, HX OF (07/25/2007), DIVERTICULOSIS, COLON (07/22/2007), Dyspnea, Edema, peripheral,  HYPERLIPIDEMIA (02/03/2008), Hypersomnia, Hypertension, MENOPAUSAL DISORDER (01/09/2011), Morbid obesity ([place]) (07/22/2007), NICM (nonischemic cardiomyopathy) ([place]) (10/24/2016), Raynaud's syndrome (07/22/2007), Stroke ([place]) (2017), THYROID NODULE, RIGHT (01/04/2010), and VITAMIN D DEFICIENCY (01/09/2011).  has a past surgical history that includes Partial hysterectomy; Cardiac catheterization (N/A, 10/23/2016); Cardioversion (N/A, 10/31/2016); TEE without cardioversion (N/A, 10/31/2016); Cardioversion (N/A, 11/03/2016); Cardioversion (N/A, 11/18/2016); Cardioversion (N/A, 03/25/2017); TEE without cardioversion (N/A, 03/25/2017); Cardioversion (N/A, 05/06/2017); Cardioversion (N/A, 05/12/2017); Colonoscopy w/ polypectomy (02/2011); and Colonoscopy (N/A, 12/04/2017). Prior to Admission medications   Medication Sig Start Date End Date Taking? Authorizing Provider  amiodarone (PACERONE) 200 MG tablet Take 1 tablet (200 mg total) by mouth daily. 03/11/18  Yes Amin, Jeanella Flattery, MD  atorvastatin (LIPITOR) 40 MG tablet Take 1 tablet (40 mg total) by mouth daily at 6 PM. 03/10/18  Yes Amin, Ankit Chirag, MD  carvedilol (COREG) 3.125 MG tablet Take 1 tablet (3.125 mg total) by mouth 2 (two) times daily. 07/09/17 03/15/26 Yes [name], Denice Bors, MD  isosorbide-hydrALAZINE (BIDIL) 20-37.5 MG tablet Take 1 tablet by mouth 3 (three) times daily. Patient taking differently: Take 2 tablets by mouth 3 (three) times daily.  10/20/17  Yes [name], MD  potassium chloride (K-DUR) 10 MEQ tablet Take 2 tablets (20 mEq total) by mouth daily. 03/10/18  Yes Amin, Jeanella Flattery, MD  ranolazine (RANEXA) 500 MG 12 hr tablet Take 1 tablet (500 mg total) by mouth 2 (two) times daily. 09/14/17  Yes Bensimhon, Shaune Pascal, MD  rivaroxaban (XARELTO) 20 MG TABS tablet Take 1 tablet (20 mg total) by mouth daily with supper. 03/10/18  Yes Amin,  Ankit Chirag, MD  rosuvastatin (CRESTOR) 10 MG tablet Take 10 mg by mouth daily.   Yes [provider]   sacubitril-valsartan (ENTRESTO) 24-26 MG Take 1 tablet by mouth 2 (two) times daily.   Yes [provider]  Investigational - Study Medication Take 1 tablet by mouth 2 (two) times daily. Study name: Galactic HF Study  Additional study details: Omecamtiv Mecarbil or Placebo Patient not taking: Reported on 03/15/2018 09/17/17   Larey Dresser, MD   Allergies  Allergen Reactions  . Ace Inhibitors Palpitations    FAMILY HISTORY:  family history includes Asthma in her mother; Diabetes in her father; Heart disease in her father and sister; Lung disease in her sister. SOCIAL HISTORY:  reports that she quit smoking about 16 months ago. Her smoking use included cigarettes. She smoked 0.50 packs per day. She has never used smokeless tobacco. She reports that she does not drink alcohol or use drugs.  REVIEW OF SYSTEMS:  POSITIVES IN BOLD  Gen: Denies fever, chills, weight change, fatigue PULM: Denies shortness of breath, cough, sputum production, hemoptysis CV: Denies chest pain, edema, orthopnea, palpitations GI: Denies abdominal cramps, pain, nausea, vomiting, diarrhea, hematochezia, constipation, change in bowel habits GU: Denies dysuria, hematuria, oliguria, urethral discharge, chronic foley Endocrine: Denies hot or cold intolerance, polyuria, polyphagia or appetite change Heme: Denies easy bruising, bleeding, bleeding gums Neuro: Denies headache, numbness, weakness, slurred speech, syncope, loss of memory or consciousness  SUBJECTIVE:  Denies complaints of SOB, chest pain, near syncope, or overt abdominal pain.  Reports mild cramps with passing of stool.  Episode of hematochezia while in room   VITAL SIGNS: Temp:  [98.7 F (37.1 C)-98.8 F (37.1 C)] 98.8 F (37.1 C) (04/22 0822) Pulse Rate:  [60-81] 76 (04/22 0822) Resp:  [16-25] 20 (04/22 9622) BP: (82-127)/(57-82) 97/67 (04/22 0822) SpO2:  [95 %-100 %] 100 % (04/22 0822) Weight:  [225 lb (102.1 kg)] 225 lb (102.1 kg)  (04/22 0520)  PHYSICAL EXAMINATION: General:  Adult female lying in bed in no distress HEENT: MM pink/moist, no JVD, PERRL Neuro:  Alert, oriented, non focal  CV: rrr, no m/r/g PULM: even/non-labored, lungs bilaterally clear GI: obese, soft, non-tender, bs active, moderate amount of hematochezia on exam  Extremities: warm/dry, +1 BLE edema  Skin: no rashes    Recent Labs  Lab 03/09/18 0227 03/10/18 0238 03/15/18 0536  NA 141 142 138  K 3.7 3.7 4.2  CL 109 108 104  CO2 26 28 28   BUN 33* 21* 30*  CREATININE 1.67* 1.53* 2.20*  GLUCOSE 100* 92 94   Recent Labs  Lab 03/09/18 0227 03/10/18 0238 03/15/18 0536  HGB 8.4* 8.3* 9.0*  HCT 27.8* 27.1* 28.7*  WBC 6.6 5.9 7.0  PLT 124* 136* 197   No results found.  SIGNIFICANT EVENTS  4/22 Admit  STUDIES:  none  BRIEF PATIENT DESCRIPTION:  56 yoF on Xarelto for Afib with recent hospitalization for diverticular bleed that resolved with correction of INR while on coumadin presenting back with hematochezia since 0200.  One episode of asymptomatic hypotension currently receiving RPBC, Kcentra, FFP and vit K   ASSESSMENT / PLAN:  PULMONARY A: No acute issues - no SOB, stable oxygenation on room air P:   Monitor for fluid overload in the setting of multiple blood products/meds in the setting of chronic systolic HF  CARDIOVASCULAR A:  Hypotension- resolved with fluid administration Afib s/p several DCCV on Xarelto Chronic systolic HF, biventricular HF, EF 40-45% - currently in  SR, rate controlled - currently has 2 PIV, 16 g P:  Tele monitoring Per TRH who is admitting to SDU Hold further anticoagulation, will need Cardiology input regarding risk/benefit ratio Goal MAP > 65 Consider further central access if becomes unstable, currently with 2 large bore, 16g PIV Hold further diuretics and home antihypertensives If further hemodynamic instability, consider transfer to ICU and PCCM will assume care  RENAL A:   AKI on  CKD Chronic urinary retention w/chronic indwelling foley - urine clear yellow P:   Trend BMP/ mag / urinary output/ daily wts Replace electrolytes as indicated  GASTROINTESTINAL A:   Hematochezia - Presumed diverticular bleed in the setting of Xarelto P:   GI consulted Considering possible nuc med RBC scan Hold further coagulation NPO Monitor output Receiving Kcentra, PRBC x 2, FFP, and vit K  HEMATOLOGIC A:   Anemia- stable on labs P:  Trend H/H per primary team SCDs only Hold further anticoagulation Trend coags   INFECTIOUS A:   No acute issues  P:   Monitor clinically   ENDOCRINE A:   No acute issues  P:   CBG q 4 while NPO  NEUROLOGIC A:   No acute issues P:   monitor   FAMILY  - Updates: Patient updated on plan of care at bedside.  States she has one daughter who is her emergency contact.   GLOBAL:  Admitted to [facility], consulting only as she had brief asymptomatic hypotension which has now resolved.  If she decompensates with further hemodynamic instability, CCM will gladly presume care in ICU.    Kennieth Rad, AGACNP-BC [facility] Pgr: [phone] or if no answer [phone] 03/15/2018, 9:04 AM

## 2018-03-15 NOTE — Progress Notes (Signed)
Pt arrived floor. Pt settled in the room. Assessed to be AxOx4. Vitals stable. Pt admitted on the step down telemetry. Skin assessment completed. No c/o pain. Will continue to monitor.

## 2018-03-15 NOTE — ED Provider Notes (Signed)
[facility] EMERGENCY DEPARTMENT Provider Note   CSN: [id number] Arrival date & time: 03/15/18  0516     History   Chief Complaint Chief Complaint  Patient presents with  . GI Bleeding    HPI Patricia Wagner is a 67 y.o. female with a history of a-fib, CHF, CKD 3, stroke, anticoagulated, who presents today for evaluation of bright red blood per rectum.  She reports that this morning she got up and was in bed watching TV when she felt like she had a bowel movement, when she went she reports that she filled the toilet bowl up with bright red blood and clots.  She was recently admitted for acute kidney injury and required blood transfusion for rectal bleeding.  She was changed from warfarin to Xarelto.  She denies any pain, does not feel lightheaded, short of breath, or dizzy.  She denies any recent trauma.  HPI  Past Medical History:  Diagnosis Date  . Arthritis   . Atrial fibrillation ([place])    a. s/p multiple cardioversions; failed tikosyn/sotalol.  Marland Kitchen CAD in native artery, s/p cardiac cath with non obstructive CAD 10/24/2016  . CHF (congestive heart failure) ([place])   . CKD (chronic kidney disease) stage 3, GFR 30-59 ml/min (HCC)   . DIVERTICULITIS, HX OF 07/25/2007  . DIVERTICULOSIS, COLON 07/22/2007  . Dyspnea   . Edema, peripheral    a. chronic BLE edema, R>L. Prior trauma from dog attack and accident.  Marland Kitchen HYPERLIPIDEMIA 02/03/2008  . Hypersomnia    declines w/u  . Hypertension   . MENOPAUSAL DISORDER 01/09/2011  . Morbid obesity ([place]) 07/22/2007  . NICM (nonischemic cardiomyopathy) ([place]) 10/24/2016  . Raynaud's syndrome 07/22/2007  . Stroke ([place]) 2017  . THYROID NODULE, RIGHT 01/04/2010  . VITAMIN D DEFICIENCY 01/09/2011   Qualifier: Diagnosis of  By: [name], [name]     Patient Active Problem List   Diagnosis Date Noted  . Lower GI bleed 03/15/2018  . Chronic anticoagulation 03/15/2018  . Rectal bleed   . CKD (chronic kidney disease) stage 3, GFR 30-59  ml/min (HCC) 03/05/2018  . Coagulopathy ([place]) 03/05/2018  . Chronic systolic (congestive) heart failure ([place]) 03/05/2018  . Chronic atrial fibrillation ([place]) 03/05/2018  . Acute blood loss anemia 03/05/2018  . Diverticulosis 03/05/2018  . Heme positive stool 03/05/2018  . Urine abnormality 02/02/2018  . Cough 02/02/2018  . Bacteremia due to Gram-negative bacteria 12/30/2017  . Increased anion gap metabolic acidosis 76/81/1572  . Metabolic alkalosis 62/01/5596  . Acute UTI 12/29/2017  . Obstructive uropathy   . Anemia 12/17/2017  . Diarrhea 11/30/2017  . Urinary retention 11/23/2017  . Lower abdominal pain 11/23/2017  . Dysuria 07/21/2017  . Suspected sleep apnea 05/20/2017  . Pressure injury of skin 05/02/2017  . Acute hypercapnic respiratory failure ([place]) 04/30/2017  . Hypotension (arterial) 04/30/2017  . Bradycardia 11/20/2016  . Cerebrovascular accident (CVA) due to embolism of left cerebellar artery ([place])   . PAF (paroxysmal atrial fibrillation) ([place])   . CAD in native artery, s/p cardiac cath with non obstructive CAD 10/24/2016  . NICM (nonischemic cardiomyopathy) ([place]) 10/24/2016  . Tobacco abuse 10/20/2016  . Acute renal failure superimposed on chronic kidney disease ([place]) 10/20/2016  . Hematochezia 06/29/2013  . Degenerative arthritis of left knee 03/15/2012  . Encounter for well adult exam with abnormal findings 03/12/2012  . Hypersomnia   . VITAMIN D DEFICIENCY 01/09/2011  . MENOPAUSAL DISORDER 01/09/2011  . THYROID NODULE, RIGHT 01/04/2010  .  HLD (hyperlipidemia) 02/03/2008  . LEG CRAMPS, NOCTURNAL 02/03/2008  . LOW BACK PAIN 07/25/2007  . Morbid obesity ([place]) 07/22/2007  . Essential hypertension 07/22/2007  . Raynaud's syndrome 07/22/2007  . Diverticulosis of colon with hemorrhage 07/22/2007  . SYMPTOM, EDEMA 07/22/2007    Past Surgical History:  Procedure Laterality Date  . CARDIAC CATHETERIZATION N/A 10/23/2016   Procedure: Left Heart Cath and Coronary  Angiography;  Surgeon: [name], MD;  Location: [facility];  Service: Cardiovascular;  Laterality: N/A;  . CARDIOVERSION N/A 10/31/2016   Procedure: CARDIOVERSION;  Surgeon: [name], MD;  Location: [facility];  Service: Cardiovascular;  Laterality: N/A;  . CARDIOVERSION N/A 11/03/2016   Procedure: CARDIOVERSION;  Surgeon: [name], MD;  Location: [facility];  Service: Cardiovascular;  Laterality: N/A;  . CARDIOVERSION N/A 11/18/2016   Procedure: CARDIOVERSION;  Surgeon: [name], MD;  Location: [facility];  Service: Cardiovascular;  Laterality: N/A;  . CARDIOVERSION N/A 03/25/2017   Procedure: CARDIOVERSION;  Surgeon: [name], MD;  Location: [facility] ENDOSCOPY;  Service: Cardiovascular;  Laterality: N/A;  . CARDIOVERSION N/A 05/06/2017   Procedure: CARDIOVERSION;  Surgeon: [name], MD;  Location: [facility] ENDOSCOPY;  Service: Cardiovascular;  Laterality: N/A;  . CARDIOVERSION N/A 05/12/2017   Procedure: CARDIOVERSION;  Surgeon: [name], MD;  Location: [facility] ENDOSCOPY;  Service: Cardiovascular;  Laterality: N/A;  . COLONOSCOPY N/A 12/04/2017   Procedure: COLONOSCOPY;  Surgeon: [name], MD;  Location: [facility];  Service: Endoscopy;  Laterality: N/A;  . COLONOSCOPY W/ POLYPECTOMY  02/2011   pan diverticulosis.  tubular adenoma without dysplasia on 5 mm sigmoid polyp.  Dr Fuller Plan.    Marland Kitchen PARTIAL HYSTERECTOMY     1 OVARY LEFT  . TEE WITHOUT CARDIOVERSION N/A 10/31/2016   Procedure: TRANSESOPHAGEAL ECHOCARDIOGRAM (TEE);  Surgeon: [name], MD;  Location[facility] ENDOSCOPY;  Service: Cardiovascular;  Laterality: N/A;  . TEE WITHOUT CARDIOVERSION N/A 03/25/2017   Procedure: TRANSESOPHAGEAL ECHOCARDIOGRAM (TEE);  Surgeon: [name], MD;  Location: [facility] ENDOSCOPY;  Service: Cardiovascular;  Laterality: N/A;     OB History   None      Home Medications    Prior to Admission medications   Medication Sig Start Date End Date Taking?  Authorizing Provider  amiodarone (PACERONE) 200 MG tablet Take 1 tablet (200 mg total) by mouth daily. 03/11/18  Yes Amin, Jeanella Flattery, MD  carvedilol (COREG) 3.125 MG tablet Take 1 tablet (3.125 mg total) by mouth 2 (two) times daily. 07/09/17 03/15/26 Yes [name], Denice Bors, MD  isosorbide-hydrALAZINE (BIDIL) 20-37.5 MG tablet Take 1 tablet by mouth 3 (three) times daily. Patient taking differently: Take 2 tablets by mouth 3 (three) times daily.  10/20/17  Yes [name], MD  potassium chloride (K-DUR) 10 MEQ tablet Take 2 tablets (20 mEq total) by mouth daily. 03/10/18  Yes Amin, Jeanella Flattery, MD  ranolazine (RANEXA) 500 MG 12 hr tablet Take 1 tablet (500 mg total) by mouth 2 (two) times daily. 09/14/17  Yes Bensimhon, Shaune Pascal, MD  rivaroxaban (XARELTO) 20 MG TABS tablet Take 1 tablet (20 mg total) by mouth daily with supper. 03/10/18  Yes Amin, Ankit Chirag, MD  rosuvastatin (CRESTOR) 10 MG tablet Take 10 mg by mouth daily.   Yes [provider]  sacubitril-valsartan (ENTRESTO) 24-26 MG Take 1 tablet by mouth 2 (two) times daily.   Yes [provider]    Family History Family History  Problem Relation Age of Onset  .  Asthma Mother   . Diabetes Father   . Heart disease Father        Died of presumed heart attack - 38s  . Lung disease Sister   . Heart disease Sister        Twin sister has heart issue, unclear what kind  . Thyroid disease Neg Hx     Social History Social History   Tobacco Use  . Smoking status: Former Smoker    Packs/day: 0.50    Types: Cigarettes    Last attempt to quit: 10/23/2016    Years since quitting: 1.3  . Smokeless tobacco: Never Used  . Tobacco comment: Smoked for 50 years  Substance Use Topics  . Alcohol use: No    Alcohol/week: 2.4 oz    Types: 4 Cans of beer per week    Frequency: Never    Comment: Intermittent  . Drug use: No     Allergies   Ace inhibitors   Review of Systems Review of Systems  Constitutional:  Negative for chills and fever.  HENT: Negative for ear pain and sore throat.   Eyes: Negative for pain and visual disturbance.  Respiratory: Negative for cough and shortness of breath.   Cardiovascular: Negative for chest pain and palpitations.  Gastrointestinal: Positive for blood in stool. Negative for abdominal pain, nausea, rectal pain and vomiting.  Genitourinary: Negative for dysuria, hematuria and vaginal bleeding (s/p hysterectomy).  Musculoskeletal: Negative for arthralgias and back pain.  Skin: Negative for color change and rash.  Neurological: Negative for seizures and syncope.  Hematological: Bruises/bleeds easily.  All other systems reviewed and are negative.    Physical Exam Updated Vital Signs BP 97/67 (BP Location: Right Arm)   Pulse 76   Temp 98.8 F (37.1 C) (Oral)   Resp 20   Ht 5\' 5"  (1.651 m)   Wt 102.1 kg (225 lb)   SpO2 100%   BMI 37.44 kg/m   Physical Exam  Constitutional: She is oriented to person, place, and time. She appears well-developed and well-nourished. No distress.  HENT:  Head: Normocephalic and atraumatic.  Eyes: Conjunctivae are normal.  Neck: Neck supple.  Cardiovascular: Normal rate and regular rhythm.  No murmur heard. Pulmonary/Chest: Effort normal and breath sounds normal. No respiratory distress.  Abdominal: Soft. Bowel sounds are normal. She exhibits no distension and no mass. There is no tenderness. There is no guarding.  Prior to my evaluation tech suctioned about 200 ml of blood from rectum.   Genitourinary:  Genitourinary Comments: With chaperone in room attempted to perform rectal exam, there was frank bright red blood per rectum with passage of approximately 200 cc of blood/clot.  Musculoskeletal: She exhibits no edema.  Neurological: She is alert and oriented to person, place, and time.  Skin: Skin is warm and dry. She is not diaphoretic.  Psychiatric: She has a normal mood and affect. Her behavior is normal.  Nursing  note and vitals reviewed.    ED Treatments / Results  Labs (all labs ordered are listed, but only abnormal results are displayed) Labs Reviewed  COMPREHENSIVE METABOLIC PANEL - Abnormal; Notable for the following components:      Result Value   BUN 30 (*)    Creatinine, Ser 2.20 (*)    Calcium 8.6 (*)    Total Protein 5.7 (*)    Albumin 3.0 (*)    AST 13 (*)    ALT 12 (*)    Alkaline Phosphatase 37 (*)    GFR  calc non Af Amer 22 (*)    GFR calc Af Amer 26 (*)    All other components within normal limits  CBC - Abnormal; Notable for the following components:   RBC 3.06 (*)    Hemoglobin 9.0 (*)    HCT 28.7 (*)    RDW 16.9 (*)    All other components within normal limits  PROTIME-INR - Abnormal; Notable for the following components:   Prothrombin Time 29.9 (*)    All other components within normal limits  APTT  PROTIME-INR  CBC  CBC  CBC  POC OCCULT BLOOD, ED  TYPE AND SCREEN  PREPARE FRESH FROZEN PLASMA  PREPARE RBC (CROSSMATCH)    EKG None  Radiology No results found.  Procedures Procedures (including critical care time) CRITICAL CARE Performed by: Wyn Quaker Total critical care time: 50 minutes Critical care time was exclusive of separately billable procedures and treating other patients. Critical care was necessary to treat or prevent imminent or life-threatening deterioration. Critical care was time spent personally by me on the following activities: development of treatment plan with patient and/or surrogate as well as nursing, discussions with consultants, evaluation of patient's response to treatment, examination of patient, obtaining history from patient or surrogate, ordering and performing treatments and interventions, ordering and review of laboratory studies, ordering and review of radiographic studies, pulse oximetry and re-evaluation of patient's condition.  Life-threatening GI bleed of a anticoagulated patient requiring Kcentra for rapid  reversal, FFP, vitamin K, and blood transfusion, dropped BP to 81 systolic  Medications Ordered in ED Medications  phytonadione (VITAMIN K) 5 mg in dextrose 5 % 50 mL IVPB (has no administration in time range)  0.9 %  sodium chloride infusion (has no administration in time range)  0.9 %  sodium chloride infusion (10 mL/hr Intravenous New Bag/Given 03/15/18 0819)  prothrombin complex conc human (KCENTRA) IVPB 5,170 Units (5,170 Units Intravenous New Bag/Given 03/15/18 5397)     Initial Impression / Assessment and Plan / ED Course  I have reviewed the triage vital signs and the nursing notes.  Pertinent labs & imaging results that were available during my care of the patient were reviewed by me and considered in my medical decision making (see chart for details).  Clinical Course as of Mar 15 924  Mon Mar 15, 2018  0701 Spoke with Dr. Hilarie Fredrickson, will come see patient.    [EH]  [phone] Informed blood bank that need blood and ffp ASAP.  Informed Dr. Roderic Palau of patient new hypotension. Ordered emergent reversal   [EH]  [phone] Spoke with Dr. Lorin Mercy, will call CCM   [EH]  [phone] Discussed with patient the risks and benefits of blood, FFP, Kcentra and other treatments, she stated her understanding.     [EH]    Clinical Course User Index [EH] Lorin Glass, PA-C   Patient presents today for evaluation of copious amounts of bright red blood per rectum with softball size clots.  When attempting rectal exam there was passage of approximately 200 mL's of bright red blood with clots present.  Patient takes Xarelto and is chronically anticoagulated for A. Fib.  Initially patient hemodynamically stable, BP 127/79, however this slowly decreased to 82/57.  Patient not tachycardic, takes betablocker.  Patient was treated with IV fluids, and blood.  Discussed with patient that this is a life threatening GI bleed which will require reversal of her anticoagulation.  We discussed these risks, and she stated her  understanding.  Patient treated with 2  liters IVF, Kcentra, 2 units of RBC, one unit FFP and 5mg  vitamin K iv.    Hemoglobin 9, however at the time of consult to hospitalist and PCCM patient had an estimated blood loss of 800-1,000 ml by rectum, given hypotension blood started.  Cr mildly elevated from normal.  Remainder of labs appear consistent with baseline.    GI was consulted who agreed to see patient.  Hospitalist and PCCM were both consulted, hospitalist will admit patient.    This patient was seen as a shared visit with Dr.  Leonides Schanz and Dr. Roderic Palau who both saw the patient and agreed with my plan.    Final Clinical Impressions(s) / ED Diagnoses   Final diagnoses:  Lower GI bleed  Acute GI bleeding  Hypotension due to blood loss  Current use of long term anticoagulation    ED Discharge Orders    None       Ollen Gross 03/15/18 4403    Milton Ferguson, MD 03/16/18 [phone]

## 2018-03-16 DIAGNOSIS — K5731 Diverticulosis of large intestine without perforation or abscess with bleeding: Principal | ICD-10-CM

## 2018-03-16 LAB — BASIC METABOLIC PANEL
ANION GAP: 6 (ref 5–15)
BUN: 32 mg/dL — AB (ref 6–20)
CHLORIDE: 108 mmol/L (ref 101–111)
CO2: 26 mmol/L (ref 22–32)
Calcium: 8.5 mg/dL — ABNORMAL LOW (ref 8.9–10.3)
Creatinine, Ser: 1.83 mg/dL — ABNORMAL HIGH (ref 0.44–1.00)
GFR, EST AFRICAN AMERICAN: 33 mL/min — AB (ref >60)
GFR, EST NON AFRICAN AMERICAN: 28 mL/min — AB (ref >60)
Glucose, Bld: 85 mg/dL (ref 65–99)
POTASSIUM: 4.3 mmol/L (ref 3.5–5.1)
SODIUM: 140 mmol/L (ref 135–145)

## 2018-03-16 LAB — PREPARE FRESH FROZEN PLASMA: Unit division: 0

## 2018-03-16 LAB — CBC
HCT: 26.6 % — ABNORMAL LOW (ref 36.0–46.0)
HCT: 25.6 % — ABNORMAL LOW (ref 36.0–46.0)
HCT: 25.3 % — ABNORMAL LOW (ref 36.0–46.0)
HEMOGLOBIN: 8.5 g/dL — AB (ref 12.0–15.0)
Hemoglobin: 8.2 g/dL — ABNORMAL LOW (ref 12.0–15.0)
Hemoglobin: 8 g/dL — ABNORMAL LOW (ref 12.0–15.0)
MCH: 29.2 pg (ref 26.0–34.0)
MCH: 29.4 pg (ref 26.0–34.0)
MCH: 29.3 pg (ref 26.0–34.0)
MCHC: 32 g/dL (ref 30.0–36.0)
MCHC: 32 g/dL (ref 30.0–36.0)
MCHC: 31.6 g/dL (ref 30.0–36.0)
MCV: 91.4 fL (ref 78.0–100.0)
MCV: 91.8 fL (ref 78.0–100.0)
MCV: 92.7 fL (ref 78.0–100.0)
PLATELETS: 138 10*3/uL — AB (ref 150–400)
Platelets: 130 10*3/uL — ABNORMAL LOW (ref 150–400)
Platelets: 135 10*3/uL — ABNORMAL LOW (ref 150–400)
RBC: 2.91 MIL/uL — ABNORMAL LOW (ref 3.87–5.11)
RBC: 2.79 MIL/uL — AB (ref 3.87–5.11)
RBC: 2.73 MIL/uL — ABNORMAL LOW (ref 3.87–5.11)
RDW: 17 % — ABNORMAL HIGH (ref 11.5–15.5)
RDW: 16.9 % — AB (ref 11.5–15.5)
RDW: 17 % — AB (ref 11.5–15.5)
WBC: 5.8 10*3/uL (ref 4.0–10.5)
WBC: 5.5 10*3/uL (ref 4.0–10.5)
WBC: 5.7 10*3/uL (ref 4.0–10.5)

## 2018-03-16 LAB — BPAM FFP
Blood Product Expiration Date: 201904272359
ISSUE DATE / TIME: 201904220752
UNIT TYPE AND RH: 8400

## 2018-03-16 LAB — MRSA PCR SCREENING: MRSA BY PCR: NEGATIVE

## 2018-03-16 NOTE — Progress Notes (Signed)
[facility] TEAM 1 - Stepdown/ICU TEAM  Patricia Wagner  ZOX:[id number] DOB: 04/09/1951 DOA: 03/15/2018 PCP: Biagio Borg, MD    Brief Narrative:  67 y.o. female with a hx of CVA; Raynaud's; HTN; HLD; CAD; afib on Xarelto; and CHF (EF 30-35% in 8/18) w/ a recent admission for diverticular bleeding.  During that admission she had subtle blood loss w/ an INR >10 on Coumadin.  She was given KCenter and changed from Coumadin to Xarelto at the time of discharge.  She began again with dark bloody stools with clots about 0230 the morning of this admit.  She is continuing to have marked BRBPR with clots w/o abdominal pain.    After her arrival in the ED her BP dropped to 81X systolic and so 2U PRBC were ordered urgently and she was also given KCentra.   Significant Events: 4/22 admit - negative nuc med GIB scan   Subjective: Feels much better.  No recurrent bleeding over night.  Denies cp, sob, n/v, or abdom pain.   Assessment & Plan:  Lower GI bleed with h/o diverticulosis Nuc Med tagged RBC scan was negative - anticoag was "reversed" - last colonoscopy January 2019 with pandiverticular disease - GI following - appears to have ceased for now   Acute blood loss anemia in the setting of chronic anticoagulation transfused 3 units PRBC as well as FFP, vitamin K, and KCentra - cont to follow Hgb in serial fashion   Acute renal failure on stage 3 CKD Baseline crt 1.5 - likely due to hypoperfusion in setting of acute anemia - follow trend - improving w/ volume expansion   Afib on Xarelto Rate controlled - on Amiodarone and Coreg - DCCV 03/25/17, 6/13 and 6/19 - as per Cardiology "she absolutely cannot tolerate AF with recurrent cardiogenic shock with AF recurs" - CHA2DS2-VASc score is 5 - off anticoag for now, and may simply not be able to tolerate as she is at high risk for recurrent GIB  HTN BP controlled  HLD Cont crestor   NICM - Chronic systolic CHF TTE 08/07/77 with EF 40-45% and grade 1  diastolic dysfunction - euvolemic at present   Obesity - Body mass index is 37.44 kg/m.   DVT prophylaxis: SCDs Code Status: FULL CODE Family Communication: no family present at time of exam  Disposition Plan: SDU  Consultants:  GI PCCM  Antimicrobials:  none   Objective: Blood pressure 124/61, pulse 71, temperature 98.2 F (36.8 C), temperature source Oral, resp. rate 13, height 5\' 5"  (1.651 m), weight 102.1 kg (225 lb), SpO2 100 %.  Intake/Output Summary (Last 24 hours) at 03/16/2018 1158 Last data filed at 03/16/2018 0917 Gross per 24 hour  Intake 3 ml  Output 575 ml  Net -572 ml   Filed Weights   03/15/18 0520  Weight: 102.1 kg (225 lb)    Examination: General: No acute respiratory distress Lungs: Clear to auscultation bilaterally without wheezes or crackles Cardiovascular: Regular rate and rhythm without murmur gallop or rub normal S1 and S2 Abdomen: Nontender, nondistended, soft, bowel sounds positive, no rebound, no ascites, no appreciable mass Extremities: No significant cyanosis, clubbing, or edema bilateral lower extremities  CBC: Recent Labs  Lab 03/10/18 0238  03/15/18 2216 03/16/18 0400 03/16/18 0832  WBC 5.9   < > 5.5 5.8 5.5  NEUTROABS 4.0  --   --   --   --   HGB 8.3*   < > 8.7* 8.5* 8.2*  HCT 27.1*   < >  27.1* 26.6* 25.6*  MCV 93.4   < > 91.2 91.4 91.8  PLT 136*   < > 135* 138* 130*   < > = values in this interval not displayed.   Basic Metabolic Panel: Recent Labs  Lab 03/10/18 0238 03/15/18 0536 03/16/18 0400  NA 142 138 140  K 3.7 4.2 4.3  CL 108 104 108  CO2 28 28 26   GLUCOSE 92 94 85  BUN 21* 30* 32*  CREATININE 1.53* 2.20* 1.83*  CALCIUM 8.0* 8.6* 8.5*  MG 1.7  --   --   PHOS 3.1  --   --    GFR: Estimated Creatinine Clearance: 36.8 mL/min (A) (by C-G formula based on SCr of 1.83 mg/dL (H)).  Liver Function Tests: Recent Labs  Lab 03/10/18 0238 03/15/18 0536  AST 12* 13*  ALT 12* 12*  ALKPHOS 34* 37*  BILITOT  0.6 0.5  PROT 5.5* 5.7*  ALBUMIN 2.9* 3.0*    Coagulation Profile: Recent Labs  Lab 03/15/18 0536  INR 2.87    HbA1C: Hgb A1c MFr Bld  Date/Time Value Ref Range Status  11/21/2016 05:41 AM 6.3 (H) 4.8 - 5.6 % Final    Comment:    (NOTE)         Pre-diabetes: 5.7 - 6.4         Diabetes: >6.4         Glycemic control for adults with diabetes: <7.0   10/20/2016 09:22 PM 5.8 (H) 4.8 - 5.6 % Final    Comment:    (NOTE)         Pre-diabetes: 5.7 - 6.4         Diabetes: >6.4         Glycemic control for adults with diabetes: <7.0      Recent Results (from the past 240 hour(s))  MRSA PCR Screening     Status: None   Collection Time: 03/06/18  4:08 PM  Result Value Ref Range Status   MRSA by PCR NEGATIVE NEGATIVE Final    Comment:        The GeneXpert MRSA Assay (FDA approved for NASAL specimens only), is one component of a comprehensive MRSA colonization surveillance program. It is not intended to diagnose MRSA infection nor to guide or monitor treatment for MRSA infections. Performed at [facility], [place] [address], [place], [place] [address]   MRSA PCR Screening     Status: None   Collection Time: 03/15/18  8:57 PM  Result Value Ref Range Status   MRSA by PCR NEGATIVE NEGATIVE Final    Comment:        The GeneXpert MRSA Assay (FDA approved for NASAL specimens only), is one component of a comprehensive MRSA colonization surveillance program. It is not intended to diagnose MRSA infection nor to guide or monitor treatment for MRSA infections. Performed at [facility], [place] [address] [place], [place] [address]      Scheduled Meds: . amiodarone  200 mg Oral Daily  . ranolazine  500 mg Oral BID  . rosuvastatin  10 mg Oral q1800  . sodium chloride flush  3 mL Intravenous Q12H     LOS: 1 day   Cherene Altes, MD [facility] Office  [phone] Pager - Text Page per Amion as per below:  On-Call/Text Page:      [email or web address]       password [email or web address]  If 7PM-7AM, please contact night-coverage [email or web address] Password [facility] 03/16/2018, 11:58 AM

## 2018-03-16 NOTE — Progress Notes (Signed)
Progress Note   Subjective  Patient is feeling well, no further bowel movements and thus no further rectal bleeding Had a negative tagged red blood cell scan late yesterday Feeling hungry Hemoglobin has been stable in the last several checks   Objective   Vital signs in last 24 hours: Temp:  [98.2 F (36.8 C)-99.2 F (37.3 C)] 99.2 F (37.3 C) (04/23 1349) Pulse Rate:  [56-71] 56 (04/23 0921) Resp:  [10-22] 10 (04/23 0921) BP: (104-126)/(57-101) 126/68 (04/23 0921) SpO2:  [96 %-100 %] 97 % (04/23 0921) FiO2 (%):  [21 %] 21 % (04/22 2123) Last BM Date: 03/15/18 Gen: awake, alert, NAD HEENT: anicteric, op clear CV: RRR, no mrg Pulm: CTA b/l Abd: soft, NT/ND, +BS throughout Ext: no c/c/e Neuro: nonfocal   Intake/Output from previous day: 04/22 0701 - 04/23 0700 In: 968 [Blood:968] Out: 425 [Urine:225] Intake/Output this shift: Total I/O In: 3 [I.V.:3] Out: 350 [Urine:350]  Lab Results: Recent Labs    03/15/18 2216 03/16/18 0400 03/16/18 0832  WBC 5.5 5.8 5.5  HGB 8.7* 8.5* 8.2*  HCT 27.1* 26.6* 25.6*  PLT 135* 138* 130*   BMET Recent Labs    03/15/18 0536 03/16/18 0400  NA 138 140  K 4.2 4.3  CL 104 108  CO2 28 26  GLUCOSE 94 85  BUN 30* 32*  CREATININE 2.20* 1.83*  CALCIUM 8.6* 8.5*   LFT Recent Labs    03/15/18 0536  PROT 5.7*  ALBUMIN 3.0*  AST 13*  ALT 12*  ALKPHOS 37*  BILITOT 0.5   PT/INR Recent Labs    03/15/18 0536  LABPROT 29.9*  INR 2.87    Studies/Results: Nm Gi Blood Loss  Result Date: 03/15/2018 CLINICAL DATA:  GI bleed EXAM: NUCLEAR MEDICINE GASTROINTESTINAL BLEEDING SCAN TECHNIQUE: Sequential abdominal images were obtained following intravenous administration of Tc-61m labeled red blood cells. RADIOPHARMACEUTICALS:  25 mCi Tc-56m pertechnetate in-vitro labeled red cells. COMPARISON:  None Correlation: CT abdomen and pelvis 12/02/2017 FINDINGS: Normal blood pool distribution of tracer. A blush of tracer in the LEFT  mid abdomen is seen throughout the exam, fails to peristalse within bowel loops during the course of the study, suspect related to tracer in the LEFT kidney. No definite abnormal gastrointestinal localization of tracer identified to suggest active GI bleeding. IMPRESSION: Negative GI bleeding scan. Electronically Signed   By: Lavonia Dana M.D.   On: 03/15/2018 18:12       Assessment / Plan:   67 year old female with history of A. fib on Xarelto, nonischemic cardiomyopathy, CKD here with lower GI bleed felt to be diverticular in nature in the setting of chronic anticoagulation also with acute kidney injury  1.  Acute diverticular hemorrhage - seems to have resolved with no bleeding now times 24 hours.  Will advance diet.  Would continue to hold Xarelto for now.  Monitor hemoglobin.  If further bleeding would repeat tagged cell scan.  2.  AKI -- improving, not yet back to baseline.  GI will remain available, call with questions    Principal Problem:   Lower GI bleed Active Problems:   HLD (hyperlipidemia)   Essential hypertension   Diverticulosis of colon with hemorrhage   Acute renal failure superimposed on chronic kidney disease (HCC)   PAF (paroxysmal atrial fibrillation) (HCC)   NICM (nonischemic cardiomyopathy) (HCC)   Chronic systolic (congestive) heart failure (HCC)   Acute blood loss anemia   Chronic anticoagulation     LOS: 1 day   Keahi Mccarney  M Kalise Fickett  03/16/2018, 3:55 PM

## 2018-03-17 ENCOUNTER — Inpatient Hospital Stay: Payer: BLUE CROSS/BLUE SHIELD | Admitting: Internal Medicine

## 2018-03-17 ENCOUNTER — Other Ambulatory Visit: Payer: Self-pay

## 2018-03-17 DIAGNOSIS — E870 Hyperosmolality and hypernatremia: Secondary | ICD-10-CM

## 2018-03-17 LAB — COMPREHENSIVE METABOLIC PANEL
ALBUMIN: 2.7 g/dL — AB (ref 3.5–5.0)
ALT: 10 U/L — AB (ref 14–54)
AST: 12 U/L — AB (ref 15–41)
Alkaline Phosphatase: 34 U/L — ABNORMAL LOW (ref 38–126)
Anion gap: 4 — ABNORMAL LOW (ref 5–15)
BUN: 26 mg/dL — ABNORMAL HIGH (ref 6–20)
CHLORIDE: 116 mmol/L — AB (ref 101–111)
CO2: 37 mmol/L — ABNORMAL HIGH (ref 22–32)
CREATININE: 1.76 mg/dL — AB (ref 0.44–1.00)
Calcium: 8.7 mg/dL — ABNORMAL LOW (ref 8.9–10.3)
GFR calc Af Amer: 34 mL/min — ABNORMAL LOW (ref >60)
GFR calc non Af Amer: 29 mL/min — ABNORMAL LOW (ref >60)
GLUCOSE: 90 mg/dL (ref 65–99)
Potassium: 4.8 mmol/L (ref 3.5–5.1)
Sodium: 157 mmol/L — ABNORMAL HIGH (ref 135–145)
Total Bilirubin: 0.5 mg/dL (ref 0.3–1.2)
Total Protein: 5.1 g/dL — ABNORMAL LOW (ref 6.5–8.1)

## 2018-03-17 LAB — CBC WITH DIFFERENTIAL/PLATELET
BASOS ABS: 0 10*3/uL (ref 0.0–0.1)
BASOS PCT: 0 %
Eosinophils Absolute: 0.3 10*3/uL (ref 0.0–0.7)
Eosinophils Relative: 5 %
HCT: 24.4 % — ABNORMAL LOW (ref 36.0–46.0)
HEMOGLOBIN: 7.7 g/dL — AB (ref 12.0–15.0)
Lymphocytes Relative: 24 %
Lymphs Abs: 1.4 10*3/uL (ref 0.7–4.0)
MCH: 29.6 pg (ref 26.0–34.0)
MCHC: 31.6 g/dL (ref 30.0–36.0)
MCV: 93.8 fL (ref 78.0–100.0)
Monocytes Absolute: 0.5 10*3/uL (ref 0.1–1.0)
Monocytes Relative: 8 %
NEUTROS ABS: 3.7 10*3/uL (ref 1.7–7.7)
NEUTROS PCT: 63 %
Platelets: 127 10*3/uL — ABNORMAL LOW (ref 150–400)
RBC: 2.6 MIL/uL — ABNORMAL LOW (ref 3.87–5.11)
RDW: 16.9 % — ABNORMAL HIGH (ref 11.5–15.5)
WBC: 5.9 10*3/uL (ref 4.0–10.5)

## 2018-03-17 LAB — BASIC METABOLIC PANEL
ANION GAP: 7 (ref 5–15)
BUN: 22 mg/dL — ABNORMAL HIGH (ref 6–20)
CHLORIDE: 106 mmol/L (ref 101–111)
CO2: 27 mmol/L (ref 22–32)
Calcium: 8.6 mg/dL — ABNORMAL LOW (ref 8.9–10.3)
Creatinine, Ser: 1.84 mg/dL — ABNORMAL HIGH (ref 0.44–1.00)
GFR calc Af Amer: 32 mL/min — ABNORMAL LOW (ref >60)
GFR, EST NON AFRICAN AMERICAN: 28 mL/min — AB (ref >60)
GLUCOSE: 112 mg/dL — AB (ref 65–99)
POTASSIUM: 4.1 mmol/L (ref 3.5–5.1)
Sodium: 140 mmol/L (ref 135–145)

## 2018-03-17 LAB — CBC
HCT: 24.8 % — ABNORMAL LOW (ref 36.0–46.0)
HEMOGLOBIN: 7.8 g/dL — AB (ref 12.0–15.0)
MCH: 29.2 pg (ref 26.0–34.0)
MCHC: 31.5 g/dL (ref 30.0–36.0)
MCV: 92.9 fL (ref 78.0–100.0)
PLATELETS: 131 10*3/uL — AB (ref 150–400)
RBC: 2.67 MIL/uL — AB (ref 3.87–5.11)
RDW: 16.9 % — ABNORMAL HIGH (ref 11.5–15.5)
WBC: 5.5 10*3/uL (ref 4.0–10.5)

## 2018-03-17 LAB — PROTIME-INR
INR: 1.19
Prothrombin Time: 15 seconds (ref 11.4–15.2)

## 2018-03-17 MED ORDER — DEXTROSE 5 % IV SOLN
INTRAVENOUS | Status: DC
  Administered 2018-03-17: 16:00:00 via INTRAVENOUS

## 2018-03-17 MED ORDER — DEXTROSE-NACL 5-0.45 % IV SOLN
INTRAVENOUS | Status: AC
  Administered 2018-03-17: 21:00:00 via INTRAVENOUS

## 2018-03-17 MED ORDER — PNEUMOCOCCAL VAC POLYVALENT 25 MCG/0.5ML IJ INJ
0.5000 mL | INJECTION | INTRAMUSCULAR | Status: AC
  Administered 2018-03-18: 0.5 mL via INTRAMUSCULAR
  Filled 2018-03-17: qty 0.5

## 2018-03-17 NOTE — Progress Notes (Signed)
There is an order for oxygen use at bed time but pt refused, per pt she is okay to sleep with two pillows without the use of oxygen will continue to monitor

## 2018-03-17 NOTE — Progress Notes (Signed)
[facility] TEAM 1 - Stepdown/ICU TEAM  Patricia Wagner  GGE:[id number] DOB: 1951-01-18 DOA: 03/15/2018 PCP: Biagio Borg, MD    Brief Narrative:  67 y.o. female with a hx of CVA; Raynaud's; HTN; HLD; CAD; afib on Xarelto; and CHF (EF 30-35% in 8/18) w/ a recent admission for diverticular bleeding.  During that admission she had subtle blood loss w/ an INR >10 on Coumadin.  She was given KCenter and changed from Coumadin to Xarelto at the time of discharge.  She began again with dark bloody stools with clots about 0230 the morning of this admit.  She is continuing to have marked BRBPR with clots w/o abdominal pain.    After her arrival in the ED her BP dropped to 46T systolic and so 2U PRBC were ordered urgently and she was also given KCentra.   Hemoglobin has remained stable.  Patient was found to have hypernatremia.  IV fluids given.  Now normalized.  Pending discharge  Significant Events: 4/22 admit - negative nuc med GIB scan   Subjective: Feels much better.  No recurrent bleeding over night.  Denies cp, sob, n/v, or abdom pain.   Assessment & Plan:  Lower GI bleed with h/o diverticulosis Nuc Med tagged RBC scan was negative - anticoag was "reversed" - last colonoscopy January 2019 with pandiverticular disease - GI following - appears to have ceased for now  PT to discuss restarting of anticoagulation with PCP on discharge  Acute blood loss anemia in the setting of chronic anticoagulation transfused 3 units PRBC as well as FFP, vitamin K, and KCentra - cont to follow Hgb in serial fashion   Acute renal failure on stage 3 CKD Baseline crt 1.5 - likely due to hypoperfusion in setting of acute anemia - follow trend - improving w/ volume expansion  IVF overnight  Afib on Xarelto Rate controlled - on Amiodarone and Coreg - DCCV 03/25/17, 6/13 and 6/19 - as per Cardiology "she absolutely cannot tolerate AF with recurrent cardiogenic shock with AF recurs" - CHA2DS2-VASc score is 5 - off  anticoag for now, and may simply not be able to tolerate as she is at high risk for recurrent GIB  HTN BP controlled  HLD Cont crestor   NICM - Chronic systolic CHF TTE 0/35/46 with EF 40-45% and grade 1 diastolic dysfunction - euvolemic at present   Obesity - Body mass index is 37.44 kg/m.   DVT prophylaxis: SCDs Code Status: FULL CODE Family Communication: no family present at time of exam  Disposition Plan: SDU  Consultants:  GI PCCM  Antimicrobials:  none   Objective: Blood pressure (!) 111/59, pulse 63, temperature 97.8 F (36.6 C), temperature source Oral, resp. rate 17, height 5\' 5"  (1.651 m), weight 102.1 kg (225 lb), SpO2 100 %.  Intake/Output Summary (Last 24 hours) at 03/17/2018 2019 Last data filed at 03/17/2018 1844 Gross per 24 hour  Intake 905 ml  Output 1450 ml  Net -545 ml   Filed Weights   03/15/18 0520  Weight: 102.1 kg (225 lb)    Examination: General: No acute respiratory distress; A&OX3 Lungs: Clear to auscultation bilaterally without wheezes or crackles Cardiovascular: Regular rate and rhythm without murmur gallop or rub normal S1 and S2 Abdomen: Nontender, nondistended, soft, bowel sounds positive, no rebound, no ascites, no appreciable mass Extremities: No significant cyanosis, clubbing, or edema bilateral lower extremities  CBC: Recent Labs  Lab 03/16/18 0832 03/16/18 1553 03/17/18 0300  WBC 5.5 5.7 5.5  HGB  8.2* 8.0* 7.8*  HCT 25.6* 25.3* 24.8*  MCV 91.8 92.7 92.9  PLT 130* 135* 322*   Basic Metabolic Panel: Recent Labs  Lab 03/16/18 0400 03/17/18 0300 03/17/18 1622  NA 140 157* 140  K 4.3 4.8 4.1  CL 108 116* 106  CO2 26 37* 27  GLUCOSE 85 90 112*  BUN 32* 26* 22*  CREATININE 1.83* 1.76* 1.84*  CALCIUM 8.5* 8.7* 8.6*   GFR: Estimated Creatinine Clearance: 36.6 mL/min (A) (by C-G formula based on SCr of 1.84 mg/dL (H)).  Liver Function Tests: Recent Labs  Lab 03/15/18 0536 03/17/18 0300  AST 13* 12*  ALT  12* 10*  ALKPHOS 37* 34*  BILITOT 0.5 0.5  PROT 5.7* 5.1*  ALBUMIN 3.0* 2.7*    Coagulation Profile: Recent Labs  Lab 03/15/18 0536 03/17/18 0300  INR 2.87 1.19    HbA1C: Hgb A1c MFr Bld  Date/Time Value Ref Range Status  11/21/2016 05:41 AM 6.3 (H) 4.8 - 5.6 % Final    Comment:    (NOTE)         Pre-diabetes: 5.7 - 6.4         Diabetes: >6.4         Glycemic control for adults with diabetes: <7.0   10/20/2016 09:22 PM 5.8 (H) 4.8 - 5.6 % Final    Comment:    (NOTE)         Pre-diabetes: 5.7 - 6.4         Diabetes: >6.4         Glycemic control for adults with diabetes: <7.0      Recent Results (from the past 240 hour(s))  MRSA PCR Screening     Status: None   Collection Time: 03/15/18  8:57 PM  Result Value Ref Range Status   MRSA by PCR NEGATIVE NEGATIVE Final    Comment:        The GeneXpert MRSA Assay (FDA approved for NASAL specimens only), is one component of a comprehensive MRSA colonization surveillance program. It is not intended to diagnose MRSA infection nor to guide or monitor treatment for MRSA infections. Performed at [facility], [place] [address] [place], [place] [address]      Scheduled Meds: . amiodarone  200 mg Oral Daily  . [START ON 03/18/2018] pneumococcal 23 valent vaccine  0.5 mL Intramuscular Tomorrow-1000  . ranolazine  500 mg Oral BID  . rosuvastatin  10 mg Oral q1800  . sodium chloride flush  3 mL Intravenous Q12H     LOS: 2 days   Benjaman Lobe, MD [facility] Pager - Text Page per Shea Evans as per below:  On-Call/Text Page:      [email or web address]      password [email or web address]  If 7PM-7AM, please contact night-coverage [email or web address] Password [facility] 03/17/2018, 8:19 PM

## 2018-03-18 DIAGNOSIS — I480 Paroxysmal atrial fibrillation: Secondary | ICD-10-CM

## 2018-03-18 DIAGNOSIS — I10 Essential (primary) hypertension: Secondary | ICD-10-CM

## 2018-03-18 DIAGNOSIS — I5022 Chronic systolic (congestive) heart failure: Secondary | ICD-10-CM

## 2018-03-18 LAB — CBC WITH DIFFERENTIAL/PLATELET
BASOS ABS: 0 10*3/uL (ref 0.0–0.1)
BASOS PCT: 0 %
EOS PCT: 6 %
Eosinophils Absolute: 0.3 10*3/uL (ref 0.0–0.7)
HEMATOCRIT: 25 % — AB (ref 36.0–46.0)
Hemoglobin: 7.8 g/dL — ABNORMAL LOW (ref 12.0–15.0)
Lymphocytes Relative: 25 %
Lymphs Abs: 1.2 10*3/uL (ref 0.7–4.0)
MCH: 29.3 pg (ref 26.0–34.0)
MCHC: 31.2 g/dL (ref 30.0–36.0)
MCV: 94 fL (ref 78.0–100.0)
MONO ABS: 0.5 10*3/uL (ref 0.1–1.0)
MONOS PCT: 11 %
Neutro Abs: 2.9 10*3/uL (ref 1.7–7.7)
Neutrophils Relative %: 58 %
PLATELETS: 135 10*3/uL — AB (ref 150–400)
RBC: 2.66 MIL/uL — ABNORMAL LOW (ref 3.87–5.11)
RDW: 16.7 % — AB (ref 11.5–15.5)
WBC: 5 10*3/uL (ref 4.0–10.5)

## 2018-03-18 LAB — BASIC METABOLIC PANEL
ANION GAP: 7 (ref 5–15)
BUN: 19 mg/dL (ref 6–20)
CALCIUM: 8.4 mg/dL — AB (ref 8.9–10.3)
CO2: 26 mmol/L (ref 22–32)
CREATININE: 1.8 mg/dL — AB (ref 0.44–1.00)
Chloride: 106 mmol/L (ref 101–111)
GFR calc Af Amer: 33 mL/min — ABNORMAL LOW (ref >60)
GFR, EST NON AFRICAN AMERICAN: 29 mL/min — AB (ref >60)
GLUCOSE: 95 mg/dL (ref 65–99)
Potassium: 4 mmol/L (ref 3.5–5.1)
Sodium: 139 mmol/L (ref 135–145)

## 2018-03-18 MED ORDER — ASPIRIN EC 325 MG PO TBEC: 325.0000 mg | DELAYED_RELEASE_TABLET | Freq: Every day | ORAL | 0 refills | Status: DC

## 2018-03-18 NOTE — Progress Notes (Signed)
Foley bag transferred to leg bag, no additional questions per patient and daughter, taken out by nurse tech via wheelchair.

## 2018-03-18 NOTE — Plan of Care (Signed)
Pt doing well no new complain will continue to monitor

## 2018-03-18 NOTE — Progress Notes (Signed)
Discharge instructions (including medications) discussed with and copy provided to patient/caregiver. Patient waiting for ride, daughter gets off work at 3:30 PM. Patient will call us when daughter arrives so staff can change standard urinary drainage bag to leg bag.

## 2018-03-18 NOTE — Progress Notes (Addendum)
Pt O2 saturation drops to 87 when falls into deep sleep she was advised to keep 2L of oxygen nasal canula on  but refused will continue to monitor, she refused to keep BP cuff on, could not record vital signs

## 2018-03-19 ENCOUNTER — Other Ambulatory Visit: Payer: Self-pay | Admitting: *Deleted

## 2018-03-19 LAB — TYPE AND SCREEN
ABO/RH(D): AB POS
Antibody Screen: NEGATIVE
UNIT DIVISION: 0
Unit division: 0
Unit division: 0
Unit division: 0

## 2018-03-19 LAB — BPAM RBC
BLOOD PRODUCT EXPIRATION DATE: 201905182359
BLOOD PRODUCT EXPIRATION DATE: 201905182359
BLOOD PRODUCT EXPIRATION DATE: 201905182359
BLOOD PRODUCT EXPIRATION DATE: 201905192359
ISSUE DATE / TIME: 201904220749
ISSUE DATE / TIME: 201904220749
UNIT TYPE AND RH: 6200
UNIT TYPE AND RH: 8400
UNIT TYPE AND RH: 8400
Unit Type and Rh: 6200

## 2018-03-19 MED ORDER — STUDY - INVESTIGATIONAL MEDICATION: 1.0000 | Freq: Two times a day (BID) | 99 refills | Status: DC

## 2018-03-23 ENCOUNTER — Other Ambulatory Visit (INDEPENDENT_AMBULATORY_CARE_PROVIDER_SITE_OTHER): Payer: BLUE CROSS/BLUE SHIELD

## 2018-03-23 ENCOUNTER — Encounter: Payer: Self-pay | Admitting: Internal Medicine

## 2018-03-23 ENCOUNTER — Ambulatory Visit: Payer: BLUE CROSS/BLUE SHIELD | Admitting: Internal Medicine

## 2018-03-23 VITALS — BP 116/78 | HR 74 | Temp 99.4°F | Ht 65.0 in | Wt 225.0 lb

## 2018-03-23 DIAGNOSIS — D62 Acute posthemorrhagic anemia: Secondary | ICD-10-CM | POA: Diagnosis not present

## 2018-03-23 DIAGNOSIS — D649 Anemia, unspecified: Secondary | ICD-10-CM | POA: Diagnosis not present

## 2018-03-23 DIAGNOSIS — K922 Gastrointestinal hemorrhage, unspecified: Secondary | ICD-10-CM

## 2018-03-23 DIAGNOSIS — N390 Urinary tract infection, site not specified: Secondary | ICD-10-CM | POA: Diagnosis not present

## 2018-03-23 DIAGNOSIS — I10 Essential (primary) hypertension: Secondary | ICD-10-CM | POA: Diagnosis not present

## 2018-03-23 LAB — BASIC METABOLIC PANEL
BUN: 18 mg/dL (ref 6–23)
CO2: 31 meq/L (ref 19–32)
Calcium: 8.9 mg/dL (ref 8.4–10.5)
Chloride: 102 mEq/L (ref 96–112)
Creatinine, Ser: 1.69 mg/dL — ABNORMAL HIGH (ref 0.40–1.20)
GFR: 39.1 mL/min — ABNORMAL LOW (ref >60.00)
GLUCOSE: 91 mg/dL (ref 70–99)
POTASSIUM: 4.3 meq/L (ref 3.5–5.1)
SODIUM: 138 meq/L (ref 135–145)

## 2018-03-23 LAB — URINALYSIS, ROUTINE W REFLEX MICROSCOPIC
NITRITE: NEGATIVE
SPECIFIC GRAVITY, URINE: 1.025 (ref 1.000–1.030)
TOTAL PROTEIN, URINE-UPE24: 100 — AB
URINE GLUCOSE: NEGATIVE
UROBILINOGEN UA: 0.2 (ref 0.0–1.0)
pH: 7.5 (ref 5.0–8.0)

## 2018-03-23 LAB — CBC WITH DIFFERENTIAL/PLATELET
BASOS ABS: 0.1 10*3/uL (ref 0.0–0.1)
Basophils Relative: 0.8 % (ref 0.0–3.0)
EOS ABS: 0.2 10*3/uL (ref 0.0–0.7)
Eosinophils Relative: 2.9 % (ref 0.0–5.0)
HCT: 27.2 % — ABNORMAL LOW (ref 36.0–46.0)
HEMOGLOBIN: 9 g/dL — AB (ref 12.0–15.0)
LYMPHS ABS: 1.2 10*3/uL (ref 0.7–4.0)
Lymphocytes Relative: 18.6 % (ref 12.0–46.0)
MCHC: 32.9 g/dL (ref 30.0–36.0)
MCV: 92.5 fl (ref 78.0–100.0)
MONO ABS: 0.4 10*3/uL (ref 0.1–1.0)
Monocytes Relative: 6.3 % (ref 3.0–12.0)
NEUTROS PCT: 71.4 % (ref 43.0–77.0)
Neutro Abs: 4.6 10*3/uL (ref 1.4–7.7)
Platelets: 209 10*3/uL (ref 150.0–400.0)
RBC: 2.94 Mil/uL — ABNORMAL LOW (ref 3.87–5.11)
RDW: 16.6 % — ABNORMAL HIGH (ref 11.5–15.5)
WBC: 6.4 10*3/uL (ref 4.0–10.5)

## 2018-03-23 MED ORDER — METHYLPREDNISOLONE ACETATE 80 MG/ML IJ SUSP: 80.0000 mg | Freq: Once | INTRAMUSCULAR | Status: DC

## 2018-03-23 MED ORDER — CEPHALEXIN 500 MG PO CAPS: 500.0000 mg | ORAL_CAPSULE | Freq: Three times a day (TID) | ORAL | 0 refills | Status: AC

## 2018-03-23 MED ORDER — CEFTRIAXONE SODIUM 1 G IJ SOLR
1.0000 g | Freq: Once | INTRAMUSCULAR | Status: AC
  Administered 2018-03-23: 1 g via INTRAMUSCULAR

## 2018-03-23 NOTE — Patient Instructions (Addendum)
You had the antibiotic shot today (rocephin)  Please take all new medication as prescribed - the cephalexin if ok with urology tomorrow  Please continue all other medications as before, and refills have been done if requested.  Please have the pharmacy call with any other refills you may need.  Please keep your appointments with your specialists as you may have planned - urology tomorrow to address the indwelling catheter  Please go to the LAB in the Basement (turn left off the elevator) for the tests to be done today  You will be contacted by phone if any changes need to be made immediately.  Otherwise, you will receive a letter about your results with an explanation, but please check with MyChart first.  Please remember to sign up for MyChart if you have not done so, as this will be important to you in the future with finding out test results, communicating by private email, and scheduling acute appointments online when needed.  Please return in 6 months, or sooner if needed

## 2018-03-23 NOTE — Progress Notes (Signed)
Subjective:    Patient ID: Patricia Wagner, female    DOB: 1951/06/23, 67 y.o.   MRN: [id number]  HPI  67 yo F with a hx ofCVA; Raynaud's; HTN; HLD; CAD; afib onXarelto; and CHF (EF 30-35% in 8/18) recently hospd twice with diverticular bleeding (d/c summary incomplete), severe anemia s/p 2 u PRBC overall doing ok. Denies worsening reflux, new abd pain, dysphagia, n/v, bowel change or further bleeding. Previous admit involved bleeding with initial INR >10 on Coumadin. She was given KCenter and changed from Coumadin to Xarelto at the time of first discharge. She began again with dark bloody stools with clots at second admit with clots w/o abdominal pain. After her arrival in the ED her BP dropped to 34V systolic and so 2U PRBC were ordered urgently and she was also given KCentra.  During the last admit hemoglobin has remained stable.  Patient was found to have hypernatremia and AKI, IV fluids given and resolved.  Pt was seen per GI Dr Hilarie Fredrickson, exam c/w acute diverticular bleeding.  Now off coumadin and xarelto, now on asa 325.  Foley catheter also placed for what sounds like urinary retention, and pt now c/o 2 days markedly worsening urinary odor and cloudiness, with mild dysuria with foley still in place.  Pt is requesting tx.  Does have urology appt tomorrow AM but does not want to wait.  Denies urinary symptoms such as flank pain, hematuria or n/v, fever, chills. Past Medical History:  Diagnosis Date  . Arthritis   . Atrial fibrillation ([place])    a. s/p multiple cardioversions; failed tikosyn/sotalol.  Marland Kitchen CAD in native artery, s/p cardiac cath with non obstructive CAD 10/24/2016  . CHF (congestive heart failure) ([place])   . CKD (chronic kidney disease) stage 3, GFR 30-59 ml/min (HCC)   . DIVERTICULITIS, HX OF 07/25/2007  . DIVERTICULOSIS, COLON 07/22/2007  . Dyspnea   . Edema, peripheral    a. chronic BLE edema, R>L. Prior trauma from dog attack and accident.  Marland Kitchen HYPERLIPIDEMIA 02/03/2008  .  Hypersomnia    declines w/u  . Hypertension   . MENOPAUSAL DISORDER 01/09/2011  . Morbid obesity ([place]) 07/22/2007  . NICM (nonischemic cardiomyopathy) ([place]) 10/24/2016  . Raynaud's syndrome 07/22/2007  . Stroke ([place]) 2017  . THYROID NODULE, RIGHT 01/04/2010  . VITAMIN D DEFICIENCY 01/09/2011   Qualifier: Diagnosis of  By: [name], [name]    Past Surgical History:  Procedure Laterality Date  . CARDIAC CATHETERIZATION N/A 10/23/2016   Procedure: Left Heart Cath and Coronary Angiography;  Surgeon: [name], MD;  Location: [place] [facility];  Service: Cardiovascular;  Laterality: N/A;  . CARDIOVERSION N/A 10/31/2016   Procedure: CARDIOVERSION;  Surgeon: [name], MD;  Location: [place];  Service: Cardiovascular;  Laterality: N/A;  . CARDIOVERSION N/A 11/03/2016   Procedure: CARDIOVERSION;  Surgeon: [name], MD;  Location: [place];  Service: Cardiovascular;  Laterality: N/A;  . CARDIOVERSION N/A 11/18/2016   Procedure: CARDIOVERSION;  Surgeon: [name], MD;  Location: [facility] ENDOSCOPY;  Service: Cardiovascular;  Laterality: N/A;  . CARDIOVERSION N/A 03/25/2017   Procedure: CARDIOVERSION;  Surgeon: [name], MD;  Location: [facility] ENDOSCOPY;  Service: Cardiovascular;  Laterality: N/A;  . CARDIOVERSION N/A 05/06/2017   Procedure: CARDIOVERSION;  Surgeon: [name], MD;  Location: [facility] ENDOSCOPY;  Service: Cardiovascular;  Laterality: N/A;  . CARDIOVERSION N/A 05/12/2017   Procedure: CARDIOVERSION;  Surgeon: [name], MD;  Location: [place];  Service:  Cardiovascular;  Laterality: N/A;  . COLONOSCOPY N/A 12/04/2017   Procedure: COLONOSCOPY;  Surgeon: [name], MD;  Location: [facility] ENDOSCOPY;  Service: Endoscopy;  Laterality: N/A;  . COLONOSCOPY W/ POLYPECTOMY  02/2011   pan diverticulosis.  tubular adenoma without dysplasia on 5 mm sigmoid polyp.  Dr Fuller Plan.    Marland Kitchen PARTIAL HYSTERECTOMY     1 OVARY LEFT  . TEE WITHOUT CARDIOVERSION N/A  10/31/2016   Procedure: TRANSESOPHAGEAL ECHOCARDIOGRAM (TEE);  Surgeon: [name], MD;  Location: [facility] ENDOSCOPY;  Service: Cardiovascular;  Laterality: N/A;  . TEE WITHOUT CARDIOVERSION N/A 03/25/2017   Procedure: TRANSESOPHAGEAL ECHOCARDIOGRAM (TEE);  Surgeon: [name], MD;  Location: [facility] ENDOSCOPY;  Service: Cardiovascular;  Laterality: N/A;    reports that she quit smoking about 16 months ago. Her smoking use included cigarettes. She smoked 0.50 packs per day. She has never used smokeless tobacco. She reports that she does not drink alcohol or use drugs. family history includes Asthma in her mother; Diabetes in her father; Heart disease in her father and sister; Lung disease in her sister. Allergies  Allergen Reactions  . Ace Inhibitors Palpitations   Current Outpatient Medications on File Prior to Visit  Medication Sig Dispense Refill  . amiodarone (PACERONE) 200 MG tablet Take 1 tablet (200 mg total) by mouth daily. 30 tablet 0  . aspirin EC 325 MG tablet Take 1 tablet (325 mg total) by mouth daily. 30 tablet 0  . carvedilol (COREG) 3.125 MG tablet Take 1 tablet (3.125 mg total) by mouth 2 (two) times daily. 180 tablet 3  . Investigational - Study Medication Take 1 tablet by mouth 2 (two) times daily. Study name: Galactic HF Study  Additional study details: Omecamtiv Mecarbil or Placebo 1 each PRN  . isosorbide-hydrALAZINE (BIDIL) 20-37.5 MG tablet Take 1 tablet by mouth 3 (three) times daily. (Patient taking differently: Take 2 tablets by mouth 3 (three) times daily. ) 180 tablet 11  . ranolazine (RANEXA) 500 MG 12 hr tablet TAKE 1 TABLET BY MOUTH TWICE A DAY 60 tablet 5  . rosuvastatin (CRESTOR) 10 MG tablet Take 10 mg by mouth daily.     No current facility-administered medications on file prior to visit.    Review of Systems  Constitutional: Negative for other unusual diaphoresis or sweats HENT: Negative for ear discharge or swelling Eyes: Negative for other worsening visual  disturbances Respiratory: Negative for stridor or other swelling  Gastrointestinal: Negative for worsening distension or other blood Genitourinary: Negative for retention or other urinary change Musculoskeletal: Negative for other MSK pain or swelling Skin: Negative for color change or other new lesions Neurological: Negative for worsening tremors and other numbness  Psychiatric/Behavioral: Negative for worsening agitation or other fatigue No other exam findings    Objective:   Physical Exam BP 116/78   Pulse 74   Temp 99.4 F (37.4 C) (Oral)   Ht 5\' 5"  (1.651 m)   Wt 225 lb (102.1 kg)   SpO2 96%   BMI 37.44 kg/m  VS noted, mild ill appearing Constitutional: Pt appears in NAD HENT: Head: NCAT.  Right Ear: External ear normal.  Left Ear: External ear normal.  Eyes: . Pupils are equal, round, and reactive to light. Conjunctivae and EOM are normal Nose: without d/c or deformity Neck: Neck supple. Gross normal ROM Cardiovascular: Normal rate and regular rhythm.   Pulmonary/Chest: Effort normal and breath sounds without rales or wheezing.  Abd:  Soft, ND, + BS, no organomegaly with low  mid abd tender, no guarding or rebound, has foley leg bag in place Neurological: Pt is alert. At baseline orientation, motor grossly intact Skin: Skin is warm. No rashes, other new lesions, no LE edema Psychiatric: Pt behavior is normal without agitation  No other exam findings    Assessment & Plan:

## 2018-03-23 NOTE — Assessment & Plan Note (Signed)
Also for f/u cbc

## 2018-03-23 NOTE — Assessment & Plan Note (Signed)
High suspicion, for ua and cx, rocephin IM 1 gm, and cephalexin course, f/u urology in AM for foley likely removal, to f/u any worsening symptoms or concerns

## 2018-03-23 NOTE — Assessment & Plan Note (Signed)
Cont asa 325 only, stable, for f/u cbc, no plans to restart systemic anticoagulation

## 2018-03-23 NOTE — Assessment & Plan Note (Signed)
stable overall by history and exam, recent data reviewed with pt, and pt to continue medical treatment as before,  to f/u any worsening symptoms or concerns BP Readings from Last 3 Encounters:  03/23/18 116/78  03/18/18 (!) 148/79  03/10/18 (!) 145/69

## 2018-03-24 ENCOUNTER — Ambulatory Visit (HOSPITAL_COMMUNITY)
Admission: RE | Admit: 2018-03-24 | Discharge: 2018-03-24 | Disposition: A | Discharge status: Home / Self Care | Payer: BLUE CROSS/BLUE SHIELD | Source: Ambulatory Visit | Attending: Internal Medicine | Admitting: Internal Medicine

## 2018-03-24 ENCOUNTER — Encounter (HOSPITAL_COMMUNITY): Payer: Self-pay

## 2018-03-24 VITALS — BP 132/74 | HR 84 | Wt 226.8 lb

## 2018-03-24 DIAGNOSIS — I130 Hypertensive heart and chronic kidney disease with heart failure and stage 1 through stage 4 chronic kidney disease, or unspecified chronic kidney disease: Secondary | ICD-10-CM | POA: Insufficient documentation

## 2018-03-24 DIAGNOSIS — Z833 Family history of diabetes mellitus: Secondary | ICD-10-CM | POA: Insufficient documentation

## 2018-03-24 DIAGNOSIS — G4710 Hypersomnia, unspecified: Secondary | ICD-10-CM | POA: Diagnosis not present

## 2018-03-24 DIAGNOSIS — I5082 Biventricular heart failure: Secondary | ICD-10-CM | POA: Insufficient documentation

## 2018-03-24 DIAGNOSIS — Z79899 Other long term (current) drug therapy: Secondary | ICD-10-CM | POA: Diagnosis not present

## 2018-03-24 DIAGNOSIS — I480 Paroxysmal atrial fibrillation: Secondary | ICD-10-CM | POA: Diagnosis not present

## 2018-03-24 DIAGNOSIS — E785 Hyperlipidemia, unspecified: Secondary | ICD-10-CM | POA: Diagnosis not present

## 2018-03-24 DIAGNOSIS — N390 Urinary tract infection, site not specified: Secondary | ICD-10-CM | POA: Insufficient documentation

## 2018-03-24 DIAGNOSIS — N183 Chronic kidney disease, stage 3 unspecified: Secondary | ICD-10-CM

## 2018-03-24 DIAGNOSIS — I428 Other cardiomyopathies: Secondary | ICD-10-CM | POA: Insufficient documentation

## 2018-03-24 DIAGNOSIS — Z825 Family history of asthma and other chronic lower respiratory diseases: Secondary | ICD-10-CM | POA: Insufficient documentation

## 2018-03-24 DIAGNOSIS — R29818 Other symptoms and signs involving the nervous system: Secondary | ICD-10-CM

## 2018-03-24 DIAGNOSIS — Z87891 Personal history of nicotine dependence: Secondary | ICD-10-CM | POA: Insufficient documentation

## 2018-03-24 DIAGNOSIS — Z7901 Long term (current) use of anticoagulants: Secondary | ICD-10-CM | POA: Diagnosis not present

## 2018-03-24 DIAGNOSIS — Z8249 Family history of ischemic heart disease and other diseases of the circulatory system: Secondary | ICD-10-CM | POA: Diagnosis not present

## 2018-03-24 DIAGNOSIS — J449 Chronic obstructive pulmonary disease, unspecified: Secondary | ICD-10-CM | POA: Insufficient documentation

## 2018-03-24 DIAGNOSIS — Z8673 Personal history of transient ischemic attack (TIA), and cerebral infarction without residual deficits: Secondary | ICD-10-CM | POA: Insufficient documentation

## 2018-03-24 DIAGNOSIS — E559 Vitamin D deficiency, unspecified: Secondary | ICD-10-CM | POA: Insufficient documentation

## 2018-03-24 DIAGNOSIS — I2510 Atherosclerotic heart disease of native coronary artery without angina pectoris: Secondary | ICD-10-CM | POA: Diagnosis not present

## 2018-03-24 DIAGNOSIS — Z7982 Long term (current) use of aspirin: Secondary | ICD-10-CM | POA: Diagnosis not present

## 2018-03-24 DIAGNOSIS — N179 Acute kidney failure, unspecified: Secondary | ICD-10-CM | POA: Insufficient documentation

## 2018-03-24 DIAGNOSIS — I481 Persistent atrial fibrillation: Secondary | ICD-10-CM | POA: Insufficient documentation

## 2018-03-24 DIAGNOSIS — Z6837 Body mass index (BMI) 37.0-37.9, adult: Secondary | ICD-10-CM | POA: Diagnosis not present

## 2018-03-24 DIAGNOSIS — Z888 Allergy status to other drugs, medicaments and biological substances status: Secondary | ICD-10-CM | POA: Diagnosis not present

## 2018-03-24 DIAGNOSIS — I5022 Chronic systolic (congestive) heart failure: Secondary | ICD-10-CM | POA: Diagnosis not present

## 2018-03-24 DIAGNOSIS — M1990 Unspecified osteoarthritis, unspecified site: Secondary | ICD-10-CM | POA: Insufficient documentation

## 2018-03-24 LAB — URINE CULTURE
MICRO NUMBER:: 90525193
SPECIMEN QUALITY:: ADEQUATE

## 2018-03-24 MED ORDER — FUROSEMIDE 20 MG PO TABS: 20.0000 mg | ORAL_TABLET | ORAL | 3 refills | Status: DC | PRN

## 2018-03-24 NOTE — Patient Instructions (Signed)
STOP taking Aspirin  START taking Lasix 20 mg as needed for weight gain of 3 lbs overnight or 5 lbs in a week.  Follow up as scheduled.

## 2018-03-24 NOTE — Progress Notes (Signed)
Advanced Heart Failure Clinic Note    Primary Cardiologist: Dr. [name] Primary HF: [name]  HPI: Patricia Wagner is a 67 y.o. female 67 year old female with a past medical history of persistent atrial fibrillation, NICM cardiomyopathy, Biventricular CHF (LVEF 15% and RV severely reduced by TEE 03/25/17), HTN, and HLD.   She has struggled with symptomatic afib since 10/2016. Had successful DCCV 10/2016. Failed DCCV 03/2017, after stopping her amiodarone. Seen in A-Fib clinic 04/09/17 and planned for repeat DCCV after further amio load.   Pt admitted to [facility] 6/6 - 05/16/17 with cardiogenic shock in the setting of persistent AF. EF 15% with severe biventricular dysfunction. Started on amio gtt and PICC line placed for dobutamine with low output HF. Underwent DCCV 05/06/17 but went back into Afib 05/08/17 and developed recurrent cardiogenic shock, requiring re-initiation of dobutamine and amio. Pt had repeat DCCV 05/12/17 with successful conversion to NSR. Amio gtt continued to ensure adequate load. Transitioned to po 05/15/17. Ranexa also added to her regimen for added anti-arrhythmic benefit. HF meds optimized as tolerated.  PT recommended SNF, but pt preferred to return home with HHPT.   Last echocardiogram August 2018 showed ejection fraction 30-35%,severe left ventricular hypertrophy, mild to moderate mitral regurgitation, biatrial enlargement and moderately reduced RV function. Fat pad bx negative. Nuclear study 10/18 felt equivocal but not favored for amyloid. Patient has been scheduled for atrial fibrillation ablation January 2019 as she has had cardiogenic shock with atrial fibrillation previously.   Admitted 2/5 through 01/08/2018 with UTI and AKI. Creatinine peaked at 7. Lasix, spiro, and entresto stopped. Required home IV antibiotics and discharged with foley. At the time of discharge creatinine was down to 1.07.   Admitted 4/12 and again on 4/22 with lower GI bleed. Initially was in setting  of INR > 10, but transitioned from coumadin to Xarelto after first admission. Pt had colonoscopy in 11/2017 which showed diverticular disease, so no repeat performed due to supratherapeutic INR. GI consulted. Tagged NM scan 03/15/18 with no active bleeding. Left off of AC for now.  Initial admission also  Complicated by UTI and urinary retention. Had foley placed.   She presents today for post hospital follow up.  She is feeling great overall. No further bleeding. Saw Urology this am. Foley removed. She was given dose of IV rocephin at Dr. Jeneen Rinks office 03/23/18 for her UTI.  She has been off lasix since getting out of hospital last week. Denies swelling, orthopnea, or PND.  Denies BLE edema. Has orders for compression hose, but hasn't picked up yet.  Weight at home stable back at baseline arounds 220 lbs. Taking all medications as directed.   Echo from 07/15/17 EF 30-35% Severe LVH. RV down. Personally reviewed  Review of systems complete and found to be negative unless listed in HPI.    Past Medical History:  Diagnosis Date  . Arthritis   . Atrial fibrillation ([place])    a. s/p multiple cardioversions; failed tikosyn/sotalol.  Marland Kitchen CAD in native artery, s/p cardiac cath with non obstructive CAD 10/24/2016  . CHF (congestive heart failure) ([place])   . CKD (chronic kidney disease) stage 3, GFR 30-59 ml/min (HCC)   . DIVERTICULITIS, HX OF 07/25/2007  . DIVERTICULOSIS, COLON 07/22/2007  . Dyspnea   . Edema, peripheral    a. chronic BLE edema, R>L. Prior trauma from dog attack and accident.  Marland Kitchen HYPERLIPIDEMIA 02/03/2008  . Hypersomnia    declines w/u  . Hypertension   . MENOPAUSAL DISORDER  01/09/2011  . Morbid obesity ([place]) 07/22/2007  . NICM (nonischemic cardiomyopathy) ([place]) 10/24/2016  . Raynaud's syndrome 07/22/2007  . Stroke ([place]) 2017  . THYROID NODULE, RIGHT 01/04/2010  . VITAMIN D DEFICIENCY 01/09/2011   Qualifier: Diagnosis of  By: [name], [name]     Current Outpatient Medications  Medication  Sig Dispense Refill  . amiodarone (PACERONE) 200 MG tablet Take 1 tablet (200 mg total) by mouth daily. 30 tablet 0  . aspirin EC 325 MG tablet Take 1 tablet (325 mg total) by mouth daily. 30 tablet 0  . carvedilol (COREG) 3.125 MG tablet Take 1 tablet (3.125 mg total) by mouth 2 (two) times daily. 180 tablet 3  . cephALEXin (KEFLEX) 500 MG capsule Take 1 capsule (500 mg total) by mouth 3 (three) times daily for 10 days. 30 capsule 0  . Investigational - Study Medication Take 1 tablet by mouth 2 (two) times daily. Study name: Galactic HF Study  Additional study details: Omecamtiv Mecarbil or Placebo 1 each PRN  . isosorbide-hydrALAZINE (BIDIL) 20-37.5 MG tablet Take 1 tablet by mouth 3 (three) times daily. 180 tablet 11  . ranolazine (RANEXA) 500 MG 12 hr tablet TAKE 1 TABLET BY MOUTH TWICE A DAY 60 tablet 5  . rosuvastatin (CRESTOR) 10 MG tablet Take 10 mg by mouth daily.     No current facility-administered medications for this encounter.     Allergies  Allergen Reactions  . Ace Inhibitors Palpitations     Social History   Socioeconomic History  . Marital status: Single    Spouse name: Not on file  . Number of children: 1  . Years of education: Not on file  . Highest education level: Not on file  Occupational History  . Occupation: Engineer, manufacturing  . Financial resource strain: Not on file  . Food insecurity:    Worry: Not on file    Inability: Not on file  . Transportation needs:    Medical: Not on file    Non-medical: Not on file  Tobacco Use  . Smoking status: Former Smoker    Packs/day: 0.50    Types: Cigarettes    Last attempt to quit: 10/23/2016    Years since quitting: 1.4  . Smokeless tobacco: Never Used  . Tobacco comment: Smoked for 50 years  Substance and Sexual Activity  . Alcohol use: No    Alcohol/week: 2.4 oz    Types: 4 Cans of beer per week    Frequency: Never    Comment: Intermittent  . Drug use: No  . Sexual activity: Not on file    Lifestyle  . Physical activity:    Days per week: Not on file    Minutes per session: Not on file  . Stress: Not on file  Relationships  . Social connections:    Talks on phone: Not on file    Gets together: Not on file    Attends religious service: Not on file    Active member of club or organization: Not on file    Attends meetings of clubs or organizations: Not on file    Relationship status: Not on file  . Intimate partner violence:    Fear of current or ex partner: Not on file    Emotionally abused: Not on file    Physically abused: Not on file    Forced sexual activity: Not on file  Other Topics Concern  . Not on file  Social History Narrative  .  Not on file     Family History  Problem Relation Age of Onset  . Asthma Mother   . Diabetes Father   . Heart disease Father        Died of presumed heart attack - 63s  . Lung disease Sister   . Heart disease Sister        Twin sister has heart issue, unclear what kind  . Thyroid disease Neg Hx     Vitals:   03/24/18 1155  BP: 132/74  Pulse: 84  SpO2: 100%  Weight: 226 lb 12.8 oz (102.9 kg)    Wt Readings from Last 3 Encounters:  03/24/18 226 lb 12.8 oz (102.9 kg)  03/23/18 225 lb (102.1 kg)  03/15/18 225 lb (102.1 kg)    PHYSICAL EXAM: General: Well appearing. No resp difficulty. HEENT: Normal Neck: Supple. JVP 5-6. Carotids 2+ bilat; no bruits. No thyromegaly or nodule noted. Cor: PMI nondisplaced. RRR, No M/G/R noted Lungs: CTAB, normal effort. Abdomen: Soft, non-tender, non-distended, no HSM. No bruits or masses. +BS  Extremities: No cyanosis, clubbing, or rash. Trace ankle edema.  Neuro: Alert & orientedx3, cranial nerves grossly intact. moves all 4 extremities w/o difficulty. Affect pleasant   EKG: NSR 60 bpm, QRS 118, personally reviewed.   ASSESSMENT & PLAN:  1. Chronic biventricular CHF due to NICM (suspect AF cardiomyopathy) - Echo  06/2017  EF 30-35% (up from 15% in setting of AF in 5/18)  -  TEE 03/25/17 with LVEF 15% and severely reduced RV function.   - Required dobutamine during previous admission with cardiogenic shock in setting of Afib RVR - Amyloid w/u negative with negative fat pad biopsy and PYP scan  - NYHA II currently - Volume status stable on exam - Take lasix 20 mg daily as needed. Volume overload several months ago was most likely 2/2 to her urinary retention.  She may eventually need to take daily.  - No spiro with recent severe AKI - Off Entresto with recent severe AKI.  - Continue Bidil 1 tabs TID.  - Continue coreg 3.125 mg BID - Encouraged daily use of TED hose.   2. Recurrent GI bleed - Tagged NM scan 03/15/18 negative - Note per Dr. Hilarie Fredrickson 03/16/18 states likely Diverticular source.  - Will continue to hold [facility] for 3 more weeks prior to re-challenging. Per [name], Will re-try on Eliquis.  - STOP ASA.   3. Persistent Afib: Cannot tolerate AF with recurrent shock. S/p DCCV on 5/2 (she stopped Amio after this) and DCCV on 05/06/17 and again 05/12/17. - EKG today NSR 60 bpm.  - She absolutely cannot tolerate AF with recurrent cardiogenic shock when AF recurs. - Continue Ranexa 500 mg BID.  - She is off [facility] for now. Will attempt to rechallenge in 3 weeks with Eliquis 5 mg BID. (Will have been off for total of 4 weeks).  - Continue amiodarone 200 mg daily.  - CHA2DS2-VASc is at least 5 (CHF, HTN, Stroke, Female), she will gain another point for age in August.   4. Suspected Sleep Apnea: - Sleep study completed.  Unable to locate results in Epic  5.  COPD:  - No wheezing.  6. AKI in the setting of urinary retention and UTI - Creatinine 1.69 03/23/18.  - Creatinine peaked at 7 during recent hospitalization in February. Creatinine 1.44 on 01/07/18 - Avoid NSAIDs.  - Following urology.  - Got ABX (Rocephin) 03/23/18  STOP ASA with GI bleed and non- obstructive CAD  on cath 10/2016.  Meds as above. Will keep appt for 3 weeks and plan to re-challenge on  Eliquis at that time.   Shirley Friar, PA-C 03/24/18    Greater than 50% of the 25 minute visit was spent in counseling/coordination of care regarding disease state education, salt/fluid restriction, sliding scale diuretics, and medication compliance.

## 2018-03-25 ENCOUNTER — Encounter (HOSPITAL_COMMUNITY): Payer: Self-pay | Admitting: *Deleted

## 2018-03-25 NOTE — Progress Notes (Signed)
Recieved continuance of disability forms from [employer].  Forms completed/signed and faxed today to [phone].  Original forms will be scanned to patient's electronic medical record.

## 2018-04-13 ENCOUNTER — Ambulatory Visit: Payer: BLUE CROSS/BLUE SHIELD | Admitting: Cardiology

## 2018-04-13 ENCOUNTER — Encounter: Payer: Self-pay | Admitting: Cardiology

## 2018-04-13 VITALS — BP 140/78 | HR 72 | Ht 65.0 in | Wt 229.6 lb

## 2018-04-13 DIAGNOSIS — Z01812 Encounter for preprocedural laboratory examination: Secondary | ICD-10-CM | POA: Diagnosis not present

## 2018-04-13 DIAGNOSIS — I428 Other cardiomyopathies: Secondary | ICD-10-CM | POA: Diagnosis not present

## 2018-04-13 DIAGNOSIS — I4819 Other persistent atrial fibrillation: Secondary | ICD-10-CM

## 2018-04-13 DIAGNOSIS — I481 Persistent atrial fibrillation: Secondary | ICD-10-CM

## 2018-04-13 NOTE — Progress Notes (Signed)
Electrophysiology Office Note   Date:  04/13/2018   ID:  Patricia Wagner, Patricia Wagner [birth year], MRN [id number]  PCP:  Biagio Borg, MD  Cardiologist:  Makena Primary Electrophysiologist:  Asaph Serena Meredith Leeds, MD    No chief complaint on file.    History of Present Illness: Patricia Wagner is a 67 y.o. female who is being seen today for the evaluation of CHF, atrial fibrillation at the request of Biagio Borg, MD. Presenting today for electrophysiology evaluation. She has a history of persistent atrial fibrillation, nonischemic cardiomyopathy, CHF, hypertension, and hyperlipidemia. She has had atrial fibrillation since December 2017. She is had multiple cardioversions and has been loaded on amiodarone. She was admitted to the hospital in June 2018 in cardiogenic shock in the setting of persistent atrial fibrillation. EF was found to be 15% with severe bi-V dysfunction. She was started on amiodarone. She was successfully cardioverted to sinus rhythm after initiation of IV amiodarone.he has not had a recurrence of her atrial fibrillation.  Unfortunately, she did have a lower GI bleed that was thought to be diverticular.  She has been off her anticoagulation.  She did require blood transfusions.  Today, denies symptoms of palpitations, chest pain, shortness of breath, orthopnea, PND, lower extremity edema, claudication, dizziness, presyncope, syncope, bleeding, or neurologic sequela. The patient is tolerating medications without difficulties.     Past Medical History:  Diagnosis Date  . Arthritis   . Atrial fibrillation ([place])    a. s/p multiple cardioversions; failed tikosyn/sotalol.  Marland Kitchen CAD in native artery, s/p cardiac cath with non obstructive CAD 10/24/2016  . CHF (congestive heart failure) ([place])   . CKD (chronic kidney disease) stage 3, GFR 30-59 ml/min (HCC)   . DIVERTICULITIS, HX OF 07/25/2007  . DIVERTICULOSIS, COLON 07/22/2007  . Dyspnea   . Edema, peripheral    a. chronic BLE edema, R>L.  Prior trauma from dog attack and accident.  Marland Kitchen HYPERLIPIDEMIA 02/03/2008  . Hypersomnia    declines w/u  . Hypertension   . MENOPAUSAL DISORDER 01/09/2011  . Morbid obesity ([place]) 07/22/2007  . NICM (nonischemic cardiomyopathy) ([place]) 10/24/2016  . Raynaud's syndrome 07/22/2007  . Stroke ([place]) 2017  . THYROID NODULE, RIGHT 01/04/2010  . VITAMIN D DEFICIENCY 01/09/2011   Qualifier: Diagnosis of  By: [name], [name]    Past Surgical History:  Procedure Laterality Date  . CARDIAC CATHETERIZATION N/A 10/23/2016   Procedure: Left Heart Cath and Coronary Angiography;  Surgeon: [name], MD;  Location: [place] [facility];  Service: Cardiovascular;  Laterality: N/A;  . CARDIOVERSION N/A 10/31/2016   Procedure: CARDIOVERSION;  Surgeon: [name], MD;  Location: [place];  Service: Cardiovascular;  Laterality: N/A;  . CARDIOVERSION N/A 11/03/2016   Procedure: CARDIOVERSION;  Surgeon: [name], MD;  Location: [place];  Service: Cardiovascular;  Laterality: N/A;  . CARDIOVERSION N/A 11/18/2016   Procedure: CARDIOVERSION;  Surgeon: [name], MD;  Location: [place];  Service: Cardiovascular;  Laterality: N/A;  . CARDIOVERSION N/A 03/25/2017   Procedure: CARDIOVERSION;  Surgeon: [name], MD;  Location: [facility] ENDOSCOPY;  Service: Cardiovascular;  Laterality: N/A;  . CARDIOVERSION N/A 05/06/2017   Procedure: CARDIOVERSION;  Surgeon: [name], MD;  Location: [facility] ENDOSCOPY;  Service: Cardiovascular;  Laterality: N/A;  . CARDIOVERSION N/A 05/12/2017   Procedure: CARDIOVERSION;  Surgeon: [name], MD;  Location: [place];  Service: Cardiovascular;  Laterality: N/A;  . COLONOSCOPY N/A 12/04/2017   Procedure: COLONOSCOPY;  Surgeon: [name], MD;  Location: [facility] ENDOSCOPY;  Service: Endoscopy;  Laterality: N/A;  . COLONOSCOPY W/ POLYPECTOMY  02/2011   pan diverticulosis.  tubular adenoma without dysplasia on 5 mm sigmoid polyp.  Dr Fuller Plan.    Marland Kitchen  PARTIAL HYSTERECTOMY     1 OVARY LEFT  . TEE WITHOUT CARDIOVERSION N/A 10/31/2016   Procedure: TRANSESOPHAGEAL ECHOCARDIOGRAM (TEE);  Surgeon: [name], MD;  Location: [facility] ENDOSCOPY;  Service: Cardiovascular;  Laterality: N/A;  . TEE WITHOUT CARDIOVERSION N/A 03/25/2017   Procedure: TRANSESOPHAGEAL ECHOCARDIOGRAM (TEE);  Surgeon: [name], MD;  Location: [facility] ENDOSCOPY;  Service: Cardiovascular;  Laterality: N/A;     Current Outpatient Medications  Medication Sig Dispense Refill  . amiodarone (PACERONE) 200 MG tablet Take 1 tablet (200 mg total) by mouth daily. 30 tablet 0  . carvedilol (COREG) 3.125 MG tablet Take 1 tablet (3.125 mg total) by mouth 2 (two) times daily. 180 tablet 3  . furosemide (LASIX) 20 MG tablet Take 1 tablet (20 mg total) by mouth as needed. Take 1 tablet daily as needed for weight gain of 3 lbs overnight or 5 lbs in a week. 30 tablet 3  . Investigational - Study Medication Take 1 tablet by mouth 2 (two) times daily. Study name: Galactic HF Study  Additional study details: Omecamtiv Mecarbil or Placebo 1 each PRN  . isosorbide-hydrALAZINE (BIDIL) 20-37.5 MG tablet Take 1 tablet by mouth 3 (three) times daily. 180 tablet 11  . ranolazine (RANEXA) 500 MG 12 hr tablet TAKE 1 TABLET BY MOUTH TWICE A DAY 60 tablet 5  . rosuvastatin (CRESTOR) 10 MG tablet Take 10 mg by mouth daily.     No current facility-administered medications for this visit.     Allergies:   Ace inhibitors   Social History:  The patient  reports that she quit smoking about 17 months ago. Her smoking use included cigarettes. She smoked 0.50 packs per day. She has never used smokeless tobacco. She reports that she does not drink alcohol or use drugs.   Family History:  The patient's family history includes Asthma in her mother; Diabetes in her father; Heart disease in her father and sister; Lung disease in her sister.    ROS:  Please see the history of present illness.   Otherwise, review of  systems is positive for none.   All other systems are reviewed and negative.   PHYSICAL EXAM: VS:  BP 140/78   Pulse 72   Ht 5\' 5"  (1.651 m)   Wt 229 lb 9.6 oz (104.1 kg)   SpO2 96%   BMI 38.21 kg/m  , BMI Body mass index is 38.21 kg/m. GEN: Well nourished, well developed, in no acute distress  HEENT: normal  Neck: no JVD, carotid bruits, or masses Cardiac: RRR; no murmurs, rubs, or gallops,no edema  Respiratory:  clear to auscultation bilaterally, normal work of breathing GI: soft, nontender, nondistended, + BS MS: no deformity or atrophy  Skin: warm and dry Neuro:  Strength and sensation are intact Psych: euthymic mood, full affect  EKG:  EKG is not ordered today. Personal review of the ekg ordered 03/24/18 shows SR, rate 60   Recent Labs: 04/29/2017: B Natriuretic Peptide 2,712.1 07/21/2017: TSH 0.54 03/10/2018: Magnesium 1.7 03/17/2018: ALT 10 03/23/2018: BUN 18; Creatinine, Ser 1.69; Hemoglobin 9.0; Platelets 209.0; Potassium 4.3; Sodium 138    Lipid Panel     Component Value Date/Time   CHOL 94 12/05/2017 1203   TRIG 54 12/05/2017  1203   HDL 47 12/05/2017 1203   CHOLHDL 2.0 12/05/2017 1203   VLDL 11 12/05/2017 1203   LDLCALC 36 12/05/2017 1203   LDLDIRECT 121.6 03/15/2012 1646     Wt Readings from Last 3 Encounters:  04/13/18 229 lb 9.6 oz (104.1 kg)  03/24/18 226 lb 12.8 oz (102.9 kg)  03/23/18 225 lb (102.1 kg)      Other studies Reviewed: Additional studies/ records that were reviewed today include: TTE 07/16/17  Review of the above records today demonstrates:  - Left ventricle: The cavity size was normal. There was severe   concentric hypertrophy. Systolic function was moderately to   severely reduced. The estimated ejection fraction was in the   range of 30% to 35%. Diffuse hypokinesis. - Mitral valve: There was mild to moderate regurgitation. - Left atrium: The atrium was severely dilated. - Right ventricle: The cavity size was mildly dilated. Wall    thickness was normal. Systolic function was moderately reduced. - Right atrium: The atrium was moderately dilated. - Pericardium, extracardiac: A trivial pericardial effusion was   identified. Features were not consistent with tamponade   physiology.  LHC 10/23/16 1. Mild, non-obstructive coronary artery disease consistent with non-ischemic cardiomyopathy. 2. Mildly elevated left ventricular filling pressure.   ASSESSMENT AND PLAN:  1.  Persistent atrial fibrillation: Only on amiodarone.  She is not anticoagulated as she has had issues with GI bleeding.  Apparently she has had some diverticular bleeds.  I did talk to her about the possibility of ablation.  Risks and benefits include bleeding,  tamponade, heart block, stroke, damage to surrounding organs.  She understands the risks and is agreed to the procedure.  That being said, she is currently not anticoagulated due to her GI bleed.  We Carney Saxton wait for her to be anticoagulated prior to scheduling.  This patients CHA2DS2-VASc Score and unadjusted Ischemic Stroke Rate (% per year) is equal to 3.2 % stroke rate/year from a score of 3  Above score calculated as 1 point each if present [CHF, HTN, DM, Vascular=MI/PAD/Aortic Plaque, Age if 65-74, or Female] Above score calculated as 2 points each if present [Age > 75, or Stroke/TIA/TE]    2. Nonischemic cardiomyopathy: Ejection fraction has remained low at 30 to 35%.  She Rooney Swails likely need a defibrillator.  We Kalee Mcclenathan plan for that after her atrial fibrillation ablation.  Current medicines are reviewed at length with the patient today.   The patient does not have concerns regarding her medicines.  The following changes were made today: None  Labs/ tests ordered today include:  No orders of the defined types were placed in this encounter.  Case discussed with primary cardiology  Disposition:   FU with Jashaun Penrose 3 months  Signed, Brendalee Matthies Meredith Leeds, MD  04/13/2018 4:22 PM     [address] [place] [address] [phone] (office) [phone] (fax)

## 2018-04-13 NOTE — Progress Notes (Signed)
Bleed was thought to diverticular. Ok to restart Eliquis.

## 2018-04-13 NOTE — Patient Instructions (Signed)
Medication Instructions:  Your physician recommends that you continue on your current medications as directed. Please refer to the Current Medication list given to you today.  Labwork: TSH today  Testing/Procedures: None ordered  Follow-Up: Your physician recommends that you schedule a follow-up appointment in: 3 months with Dr. Curt Bears.   * If you need a refill on your cardiac medications before your next appointment, please call your pharmacy.   *Please note that any paperwork needing to be filled out by the provider will need to be addressed at the front desk prior to seeing the provider. Please note that any FMLA, disability or other documents regarding health condition is subject to a $25.00 charge that must be received prior to completion of paperwork in the form of a money order or check.  Thank you for choosing [facility]!!   Trinidad Curet, RN [phone]

## 2018-04-13 NOTE — Addendum Note (Signed)
Addended by: Stanton Kidney on: 04/13/2018 04:27 PM   Modules accepted: Orders

## 2018-04-14 LAB — TSH: TSH: 0.524 u[IU]/mL (ref 0.450–4.500)

## 2018-04-14 NOTE — Progress Notes (Signed)
Advanced Heart Failure Clinic Note    Primary Cardiologist: Dr. [name] Primary HF: [name]  HPI: Patricia Wagner is a 67 y.o. female with a past medical history of persistent atrial fibrillation, NICM cardiomyopathy, Biventricular CHF (LVEF 15% and RV severely reduced by TEE 03/25/17), HTN, and HLD.   She has struggled with symptomatic afib since 10/2016. Had successful DCCV 10/2016. Failed DCCV 03/2017, after stopping her amiodarone. Seen in A-Fib clinic 04/09/17 and planned for repeat DCCV after further amio load.   Pt admitted to [facility] 6/6 - 05/16/17 with cardiogenic shock in the setting of persistent AF. EF 15% with severe biventricular dysfunction. Started on amio gtt and PICC line placed for dobutamine with low output HF. Underwent DCCV 05/06/17 but went back into Afib 05/08/17 and developed recurrent cardiogenic shock, requiring re-initiation of dobutamine and amio. Pt had repeat DCCV 05/12/17 with successful conversion to NSR. Amio gtt continued to ensure adequate load. Transitioned to po 05/15/17. Ranexa also added to her regimen for added anti-arrhythmic benefit. HF meds optimized as tolerated.  PT recommended SNF, but pt preferred to return home with HHPT.   Last echocardiogram August 2018 showed ejection fraction 30-35%,severe left ventricular hypertrophy, mild to moderate mitral regurgitation, biatrial enlargement and moderately reduced RV function. Fat pad bx negative. Nuclear study 10/18 felt equivocal but not favored for amyloid. Patient has been scheduled for atrial fibrillation ablation January 2019 as she has had cardiogenic shock with atrial fibrillation previously.   Admitted 2/5 through 01/08/2018 with UTI and AKI. Creatinine peaked at 7. Lasix, spiro, and entresto stopped. Required home IV antibiotics and discharged with foley. At the time of discharge creatinine was down to 1.07.   Admitted 4/12 and again on 4/22 with lower GI bleed. Initially was in setting of INR > 10, but  transitioned from coumadin to Xarelto after first admission. Pt had colonoscopy in 11/2017 which showed diverticular disease, so no repeat performed due to supratherapeutic INR. GI consulted. Tagged NM scan 03/15/18 with no active bleeding. Left off of AC for now.  Initial admission also complicated by UTI and urinary retention. Had foley placed.   She presents today for regular follow up. Last visit, ASA was stopped. She saw Dr Curt Bears yesterday. He plans to schedule an ablation once she is back on anticoagulation. Overall doing well today. Denies bleeding in urine or stool. Denies SOB, orthopnea, PND, or edema. She has a productive cough with clear sputum. No fever or chills. No CP, dizziness, or palpitations. Taking all medications. Limits her fluid and salt intake typically. She had a 4 lb weight gain on her scale this morning, but ate a philly cheesesteak last night. She takes lasix PRN and has taken one today. Has not had to take otherwise.  Echo from 07/15/17 EF 30-35% Severe LVH. RV down.   Review of systems complete and found to be negative unless listed in HPI.   Past Medical History:  Diagnosis Date  . Arthritis   . Atrial fibrillation ([place])    a. s/p multiple cardioversions; failed tikosyn/sotalol.  Marland Kitchen CAD in native artery, s/p cardiac cath with non obstructive CAD 10/24/2016  . CHF (congestive heart failure) ([place])   . CKD (chronic kidney disease) stage 3, GFR 30-59 ml/min (HCC)   . DIVERTICULITIS, HX OF 07/25/2007  . DIVERTICULOSIS, COLON 07/22/2007  . Dyspnea   . Edema, peripheral    a. chronic BLE edema, R>L. Prior trauma from dog attack and accident.  Marland Kitchen HYPERLIPIDEMIA 02/03/2008  . Hypersomnia  declines w/u  . Hypertension   . MENOPAUSAL DISORDER 01/09/2011  . Morbid obesity ([place]) 07/22/2007  . NICM (nonischemic cardiomyopathy) ([place]) 10/24/2016  . Raynaud's syndrome 07/22/2007  . Stroke ([place]) 2017  . THYROID NODULE, RIGHT 01/04/2010  . VITAMIN D DEFICIENCY 01/09/2011   Qualifier:  Diagnosis of  By: [name], [name]     Current Outpatient Medications  Medication Sig Dispense Refill  . amiodarone (PACERONE) 200 MG tablet Take 1 tablet (200 mg total) by mouth daily. 30 tablet 0  . carvedilol (COREG) 3.125 MG tablet Take 1 tablet (3.125 mg total) by mouth 2 (two) times daily. 180 tablet 3  . furosemide (LASIX) 20 MG tablet Take 1 tablet (20 mg total) by mouth as needed. Take 1 tablet daily as needed for weight gain of 3 lbs overnight or 5 lbs in a week. 30 tablet 3  . Investigational - Study Medication Take 1 tablet by mouth 2 (two) times daily. Study name: Galactic HF Study  Additional study details: Omecamtiv Mecarbil or Placebo 1 each PRN  . isosorbide-hydrALAZINE (BIDIL) 20-37.5 MG tablet Take 1 tablet by mouth 3 (three) times daily. 180 tablet 11  . ranolazine (RANEXA) 500 MG 12 hr tablet TAKE 1 TABLET BY MOUTH TWICE A DAY 60 tablet 5  . rosuvastatin (CRESTOR) 10 MG tablet Take 10 mg by mouth daily.     No current facility-administered medications for this encounter.     Allergies  Allergen Reactions  . Ace Inhibitors Palpitations     Social History   Socioeconomic History  . Marital status: Single    Spouse name: Not on file  . Number of children: 1  . Years of education: Not on file  . Highest education level: Not on file  Occupational History  . Occupation: Engineer, manufacturing  . Financial resource strain: Not on file  . Food insecurity:    Worry: Not on file    Inability: Not on file  . Transportation needs:    Medical: Not on file    Non-medical: Not on file  Tobacco Use  . Smoking status: Former Smoker    Packs/day: 0.50    Types: Cigarettes    Last attempt to quit: 10/23/2016    Years since quitting: 1.4  . Smokeless tobacco: Never Used  . Tobacco comment: Smoked for 50 years  Substance and Sexual Activity  . Alcohol use: No    Alcohol/week: 2.4 oz    Types: 4 Cans of beer per week    Frequency: Never    Comment: Intermittent    . Drug use: No  . Sexual activity: Not on file  Lifestyle  . Physical activity:    Days per week: Not on file    Minutes per session: Not on file  . Stress: Not on file  Relationships  . Social connections:    Talks on phone: Not on file    Gets together: Not on file    Attends religious service: Not on file    Active member of club or organization: Not on file    Attends meetings of clubs or organizations: Not on file    Relationship status: Not on file  . Intimate partner violence:    Fear of current or ex partner: Not on file    Emotionally abused: Not on file    Physically abused: Not on file    Forced sexual activity: Not on file  Other Topics Concern  . Not on file  Social History Narrative  . Not on file     Family History  Problem Relation Age of Onset  . Asthma Mother   . Diabetes Father   . Heart disease Father        Died of presumed heart attack - 44s  . Lung disease Sister   . Heart disease Sister        Twin sister has heart issue, unclear what kind  . Thyroid disease Neg Hx     Vitals:   04/15/18 1459  BP: 138/86  Pulse: 72  SpO2: 97%  Weight: 231 lb 6.4 oz (105 kg)    Wt Readings from Last 3 Encounters:  04/15/18 231 lb 6.4 oz (105 kg)  04/13/18 229 lb 9.6 oz (104.1 kg)  03/24/18 226 lb 12.8 oz (102.9 kg)    PHYSICAL EXAM: General: No resp difficulty. Walks with a cane.  HEENT: Normal Neck: Supple. JVP ~8. Carotids 2+ bilat; no bruits. No thyromegaly or nodule noted. Cor: PMI nondisplaced. RRR, No M/G/R noted Lungs: CTAB, normal effort. Abdomen: Soft, non-tender, non-distended, no HSM. No bruits or masses. +BS  Extremities: No cyanosis, clubbing, or rash. Trace ankle edema.  Neuro: Alert & orientedx3, cranial nerves grossly intact. moves all 4 extremities w/o difficulty. Affect pleasant   ASSESSMENT & PLAN:  1. Chronic biventricular CHF due to NICM (suspect AF cardiomyopathy) - TEE 03/25/17 with LVEF 15% and severely reduced RV  function.   - Echo  06/2017  EF 30-35% (up from 15% in setting of AF in 5/18).  - Echo 02/2018: EF 40-45%, grade 1 DD, RV normal - Required dobutamine during previous admission with cardiogenic shock in setting of Afib RVR - Amyloid w/u negative with negative fat pad biopsy and PYP scan  - NYHA II - Volume status mildly elevated in setting of dietary noncompliance - Take lasix 20 mg daily as needed. She already took one today. Encouraged her to take again tomorrow if weight doesn't come down.  - No spiro with recent severe AKI - Off Entresto with recent severe AKI.  - Unable to afford Bidil (has been using samples). DC bidil. Start imdur 60 mg daily and hydralazine 50 mg TID - Continue coreg 3.125 mg BID - Encouraged daily use of TED hose. - Discussed importance of limiting fluid intake, limiting salt intake, and daily weights    2. Recurrent GI bleed - Tagged NM scan 03/15/18 negative - Note per Dr. Hilarie Fredrickson 03/16/18 states likely Diverticular source.  - No s/s bleeding. CBC today.  - Start Eliquis 5 mg BID. See below.   3. Persistent Afib: Cannot tolerate AF with recurrent shock. S/p DCCV on 5/2 (she stopped Amio after this) and DCCV on 05/06/17 and again 05/12/17. - She absolutely cannot tolerate AF with recurrent cardiogenic shock when AF recurs. - Continue Ranexa 500 mg BID.  - Continue amiodarone 200 mg daily. LFTs okay 02/2018, TSH okay 03/2018 - CHA2DS2-VASc is at least 5 (CHF, HTN, Stroke, Female), she will gain another point for age in August.  - Saw Dr Curt Bears yesterday. Plans to schedule for ablation once she is back on anticoagulation. - She is 4 weeks out from her GI bleed with no s/s bleeding. Start Eliquis 5 mg BID. Pharmacy card given.   4. Suspected Sleep Apnea: - Refuses sleep study  5.  COPD:  - No wheezing.  6. Recent AKI in the setting of urinary retention and UTI - Creatinine peaked at 7 during recent hospitalization in February. Creatinine  1.69 03/23/18.  - Avoid  NSAIDs.  - Has chronic catheter now - follows with urology  7. HTN - Increase hydralazine as above  DC bidil and start imdur 60 mg daily + hydralazine 50 mg TID Start Eliquis 5 mg BID CBC, BMET today Follow up in 6 weeks with Dr [name]   Georgiana Shore, NP 04/15/18    Greater than 50% of the 25 minute visit was spent in counseling/coordination of care regarding disease state education, salt/fluid restriction, sliding scale diuretics, and medication compliance.

## 2018-04-15 ENCOUNTER — Encounter (HOSPITAL_COMMUNITY): Payer: Self-pay

## 2018-04-15 ENCOUNTER — Ambulatory Visit (HOSPITAL_COMMUNITY)
Admission: RE | Admit: 2018-04-15 | Discharge: 2018-04-15 | Disposition: A | Discharge status: Home / Self Care | Payer: BLUE CROSS/BLUE SHIELD | Source: Ambulatory Visit | Attending: Cardiology | Admitting: Cardiology

## 2018-04-15 VITALS — BP 138/86 | HR 72 | Wt 231.4 lb

## 2018-04-15 DIAGNOSIS — I10 Essential (primary) hypertension: Secondary | ICD-10-CM | POA: Diagnosis not present

## 2018-04-15 DIAGNOSIS — J449 Chronic obstructive pulmonary disease, unspecified: Secondary | ICD-10-CM | POA: Insufficient documentation

## 2018-04-15 DIAGNOSIS — I5022 Chronic systolic (congestive) heart failure: Secondary | ICD-10-CM | POA: Diagnosis not present

## 2018-04-15 DIAGNOSIS — I481 Persistent atrial fibrillation: Secondary | ICD-10-CM | POA: Diagnosis not present

## 2018-04-15 DIAGNOSIS — N390 Urinary tract infection, site not specified: Secondary | ICD-10-CM | POA: Diagnosis not present

## 2018-04-15 DIAGNOSIS — I428 Other cardiomyopathies: Secondary | ICD-10-CM

## 2018-04-15 DIAGNOSIS — N183 Chronic kidney disease, stage 3 unspecified: Secondary | ICD-10-CM

## 2018-04-15 DIAGNOSIS — I130 Hypertensive heart and chronic kidney disease with heart failure and stage 1 through stage 4 chronic kidney disease, or unspecified chronic kidney disease: Secondary | ICD-10-CM | POA: Diagnosis not present

## 2018-04-15 DIAGNOSIS — Z87891 Personal history of nicotine dependence: Secondary | ICD-10-CM | POA: Insufficient documentation

## 2018-04-15 DIAGNOSIS — I480 Paroxysmal atrial fibrillation: Secondary | ICD-10-CM

## 2018-04-15 DIAGNOSIS — Z9889 Other specified postprocedural states: Secondary | ICD-10-CM | POA: Insufficient documentation

## 2018-04-15 DIAGNOSIS — Z79899 Other long term (current) drug therapy: Secondary | ICD-10-CM | POA: Insufficient documentation

## 2018-04-15 DIAGNOSIS — I2510 Atherosclerotic heart disease of native coronary artery without angina pectoris: Secondary | ICD-10-CM | POA: Diagnosis not present

## 2018-04-15 DIAGNOSIS — E785 Hyperlipidemia, unspecified: Secondary | ICD-10-CM | POA: Insufficient documentation

## 2018-04-15 DIAGNOSIS — N179 Acute kidney failure, unspecified: Secondary | ICD-10-CM | POA: Diagnosis not present

## 2018-04-15 DIAGNOSIS — R0683 Snoring: Secondary | ICD-10-CM | POA: Diagnosis not present

## 2018-04-15 DIAGNOSIS — I7300 Raynaud's syndrome without gangrene: Secondary | ICD-10-CM | POA: Insufficient documentation

## 2018-04-15 DIAGNOSIS — E559 Vitamin D deficiency, unspecified: Secondary | ICD-10-CM | POA: Insufficient documentation

## 2018-04-15 DIAGNOSIS — I429 Cardiomyopathy, unspecified: Secondary | ICD-10-CM | POA: Diagnosis present

## 2018-04-15 DIAGNOSIS — Z8719 Personal history of other diseases of the digestive system: Secondary | ICD-10-CM

## 2018-04-15 DIAGNOSIS — Z8673 Personal history of transient ischemic attack (TIA), and cerebral infarction without residual deficits: Secondary | ICD-10-CM | POA: Diagnosis not present

## 2018-04-15 DIAGNOSIS — Z9111 Patient's noncompliance with dietary regimen: Secondary | ICD-10-CM | POA: Diagnosis not present

## 2018-04-15 DIAGNOSIS — I509 Heart failure, unspecified: Secondary | ICD-10-CM | POA: Diagnosis present

## 2018-04-15 LAB — BASIC METABOLIC PANEL
Anion gap: 5 (ref 5–15)
BUN: 22 mg/dL — AB (ref 6–20)
CHLORIDE: 108 mmol/L (ref 101–111)
CO2: 29 mmol/L (ref 22–32)
CREATININE: 1.59 mg/dL — AB (ref 0.44–1.00)
Calcium: 8.5 mg/dL — ABNORMAL LOW (ref 8.9–10.3)
GFR calc Af Amer: 39 mL/min — ABNORMAL LOW (ref >60)
GFR calc non Af Amer: 33 mL/min — ABNORMAL LOW (ref >60)
GLUCOSE: 95 mg/dL (ref 65–99)
Potassium: 4.4 mmol/L (ref 3.5–5.1)
Sodium: 142 mmol/L (ref 135–145)

## 2018-04-15 LAB — CBC
HCT: 30.1 % — ABNORMAL LOW (ref 36.0–46.0)
Hemoglobin: 8.7 g/dL — ABNORMAL LOW (ref 12.0–15.0)
MCH: 26.6 pg (ref 26.0–34.0)
MCHC: 28.9 g/dL — AB (ref 30.0–36.0)
MCV: 92 fL (ref 78.0–100.0)
PLATELETS: 268 10*3/uL (ref 150–400)
RBC: 3.27 MIL/uL — AB (ref 3.87–5.11)
RDW: 16.2 % — AB (ref 11.5–15.5)
WBC: 7 10*3/uL (ref 4.0–10.5)

## 2018-04-15 MED ORDER — HYDRALAZINE HCL 50 MG PO TABS: 50.0000 mg | ORAL_TABLET | Freq: Three times a day (TID) | ORAL | 11 refills | Status: DC

## 2018-04-15 MED ORDER — APIXABAN 5 MG PO TABS: 5.0000 mg | ORAL_TABLET | Freq: Two times a day (BID) | ORAL | 11 refills | Status: DC

## 2018-04-15 MED ORDER — ISOSORBIDE MONONITRATE ER 60 MG PO TB24: 60.0000 mg | ORAL_TABLET | Freq: Every day | ORAL | 11 refills | Status: DC

## 2018-04-15 NOTE — Discharge Summary (Signed)
Physician Discharge Summary  Patricia Wagner JXB:[id number] DOB: [birth year] DOA: 03/15/2018  PCP: Biagio Borg, MD  Admit date: 03/15/2018 Discharge date: 03/18/2018  Admitted From: Home Discharge disposition: Home   Recommendations for Outpatient Follow-Up:   1. Recommend close outpatient F/U with PCP.   Discharge Diagnosis:   Principal Problem:   Lower GI bleed Active Problems:    HLD (hyperlipidemia)   Essential hypertension   Diverticulosis of colon with hemorrhage   Acute renal failure superimposed on chronic kidney disease (HCC)   PAF (paroxysmal atrial fibrillation) (HCC)   NICM (nonischemic cardiomyopathy) (HCC)   Chronic systolic (congestive) heart failure (HCC)   Acute blood loss anemia   Chronic anticoagulation  Discharge Condition: Improved.  Diet recommendation: Low sodium, heart healthy.   Code status: Full.   History of Present Illness:   67 y.o.femalewith a hx ofCVA; Raynaud's; HTN; HLD; CAD; afib onXarelto; and CHF (EF 30-35% in 8/18)w/ a recent admission for diverticular bleeding.During that admission she had subtle blood loss w/ an INR >10 on Coumadin. She was given KCenter and changed from Coumadin to Xarelto at the time of discharge. She began again with dark bloody stools with clots about 0230 the morning of this admit. She is continuing to have marked BRBPR with clots w/o abdominal pain.   After her arrival in the ED her BP dropped to 13Y systolic and so 2U PRBC were ordered urgently and she was also given KCentra.   Hemoglobin has remained stable.  Patient was found to have hypernatremia.  IV fluids given.  Now normalized.   Hospital Course by Problem:   Principal Problem:   Lower GI bleed in a patient on chronic anticoagulation with a history of diverticulosis/acute blood loss anemia Hemoglobin 8.7 mg/dL on admission.  Last colonoscopy 11/2017 secondary to diverticular bleeding.  Given 1 unit of FFP and vitamin K in the ED.  2 units  transfused as well.  Hypotension responded to IV fluids.  GI consulted and did not feel colonoscopy indicated but did recommend proceeding with a nuclear tagged red blood cell scan.  Scan subsequently obtained and found to be negative.  No further GI bleeding noted.  Diet subsequently advanced and the patient found to be stable for discharge 03/18/2018.  Will need to consider resuming therapeutic anticoagulation post discharge.  Discharge hemoglobin 7.8 status post a total of 3 units of PRBCs.  Active Problems:   HLD (hyperlipidemia) Continue Crestor.    Essential hypertension Hypotensive on admission.  Resume antihypertensives at discharge.    Diverticulosis of colon with hemorrhage As noted above.    Acute renal failure superimposed on chronic kidney disease (HCC) Baseline creatinine 1.5.  Creatinine 1.83.  Discharge creatinine 1.80.  Bump in creatinine likely due to prerenal causes.  Recommend close outpatient follow-up.    PAF (paroxysmal atrial fibrillation) (HCC) Rate controlled on amiodarone and Coreg.  History of cardioversion. CHA2DS2-VASc score is 5.  Will need to stay off of blood thinners for at least 1 week with recommendations to resume pending follow-up with PCP and a recheck of her hemoglobin.    NICM (nonischemic cardiomyopathy) ([place]) TTE 03/06/18 with EF 40-45% and grade 1 diastolic dysfunction.    Chronic systolic (congestive) heart failure (HCC) No evidence of decompensation.   Medical Consultants:    None.   Discharge Exam:   Vitals:   03/18/18 1232 03/18/18 1305  BP:  (!) 148/79  Pulse:  (!) 56  Resp:  16  Temp: 98 F (36.7  C) 98.2 F (36.8 C)  SpO2:  100%   Vitals:   03/18/18 0810 03/18/18 0828 03/18/18 1232 03/18/18 1305  BP:  (!) 152/73  (!) 148/79  Pulse:  60  (!) 56  Resp: 16 16  16   Temp:  98.4 F (36.9 C) 98 F (36.7 C) 98.2 F (36.8 C)  TempSrc:  Oral Oral Oral  SpO2:  100%  100%  Weight:      Height:       Physical Exam done by Dr.  Aggie Moats.   The results of significant diagnostics from this hospitalization (including imaging, microbiology, ancillary and laboratory) are listed below for reference.     Procedures and Diagnostic Studies:   CLINICAL DATA:  GI bleed  EXAM: NUCLEAR MEDICINE GASTROINTESTINAL BLEEDING SCAN  TECHNIQUE: Sequential abdominal images were obtained following intravenous administration of Tc-52m labeled red blood cells.  RADIOPHARMACEUTICALS:  25 mCi Tc-58m pertechnetate in-vitro labeled red cells.  COMPARISON:  None  Correlation: CT abdomen and pelvis 12/02/2017  FINDINGS: Normal blood pool distribution of tracer.  A blush of tracer in the LEFT mid abdomen is seen throughout the exam, fails to peristalse within bowel loops during the course of the study, suspect related to tracer in the LEFT kidney.  No definite abnormal gastrointestinal localization of tracer identified to suggest active GI bleeding.  IMPRESSION: Negative GI bleeding scan.   Labs:   Discharge labs include sodium, 139, potassium 4.0, chloride 106, bicarb 26, BUN 19, creatinine 1.80, WBC 5.0, hemoglobin 7.8, platelets 135.  Discharge Instructions:   Discharge Instructions    Call MD for:  difficulty breathing, headache or visual disturbances   Complete by:  As directed    Call MD for:  extreme fatigue   Complete by:  As directed    Call MD for:  persistant dizziness or light-headedness   Complete by:  As directed    Call MD for:  persistant nausea and vomiting   Complete by:  As directed    Call MD for:  temperature >100.4   Complete by:  As directed    Diet - low sodium heart healthy   Complete by:  As directed    Discharge instructions   Complete by:  As directed    PCP f/u 1 week Cardiology f/u 1 week   Increase activity slowly   Complete by:  As directed      Allergies as of 03/18/2018      Reactions   Ace Inhibitors Palpitations      Medication List    STOP taking these  medications   ENTRESTO 24-26 MG Generic drug:  sacubitril-valsartan   potassium chloride 10 MEQ tablet Commonly known as:  K-DUR   rivaroxaban 20 MG Tabs tablet Commonly known as:  XARELTO     TAKE these medications   amiodarone 200 MG tablet Commonly known as:  PACERONE Take 1 tablet (200 mg total) by mouth daily.   carvedilol 3.125 MG tablet Commonly known as:  COREG Take 1 tablet (3.125 mg total) by mouth 2 (two) times daily.   isosorbide-hydrALAZINE 20-37.5 MG tablet Commonly known as:  BIDIL Take 1 tablet by mouth 3 (three) times daily.   ranolazine 500 MG 12 hr tablet Commonly known as:  RANEXA TAKE 1 TABLET BY MOUTH TWICE A DAY   rosuvastatin 10 MG tablet Commonly known as:  CRESTOR Take 10 mg by mouth daily.         Time coordinating discharge: 25 minutes  Signed:  Margreta Journey  Rama  Pager [phone] [facility] 04/15/2018, 11:21 AM

## 2018-04-15 NOTE — Patient Instructions (Addendum)
STOP Bidil.  START Imdur 60 mg tablet once daily.  START Hydralazine 50 mg tablet three times daily (once every 8 hours). Set alarm on phone to help remind you of all three doses.  Routine lab work today. Will notify you of abnormal results, otherwise no news is good news!  Follow up 6 weeks with [name].  __________________________________________________________ Patricia Wagner Code: 1400  Take all medication as prescribed the day of your appointment. Bring all medications with you to your appointment.  Do the following things EVERYDAY: 1) Weigh yourself in the morning before breakfast. Write it down and keep it in a log. 2) Take your medicines as prescribed 3) Eat low salt foods-Limit salt (sodium) to 2000 mg per day.  4) Stay as active as you can everyday 5) Limit all fluids for the day to less than 2 liters

## 2018-05-20 ENCOUNTER — Ambulatory Visit (HOSPITAL_COMMUNITY)
Admission: RE | Admit: 2018-05-20 | Discharge: 2018-05-20 | Disposition: A | Discharge status: Home / Self Care | Payer: BLUE CROSS/BLUE SHIELD | Source: Ambulatory Visit | Attending: Internal Medicine | Admitting: Internal Medicine

## 2018-05-20 VITALS — BP 127/56 | HR 67 | Wt 244.4 lb

## 2018-05-20 VITALS — BP 123/73 | HR 57 | Resp 18 | Wt 243.2 lb

## 2018-05-20 DIAGNOSIS — J449 Chronic obstructive pulmonary disease, unspecified: Secondary | ICD-10-CM | POA: Diagnosis not present

## 2018-05-20 DIAGNOSIS — Z836 Family history of other diseases of the respiratory system: Secondary | ICD-10-CM | POA: Insufficient documentation

## 2018-05-20 DIAGNOSIS — E559 Vitamin D deficiency, unspecified: Secondary | ICD-10-CM | POA: Insufficient documentation

## 2018-05-20 DIAGNOSIS — Z8673 Personal history of transient ischemic attack (TIA), and cerebral infarction without residual deficits: Secondary | ICD-10-CM | POA: Diagnosis not present

## 2018-05-20 DIAGNOSIS — Z7901 Long term (current) use of anticoagulants: Secondary | ICD-10-CM | POA: Diagnosis not present

## 2018-05-20 DIAGNOSIS — D649 Anemia, unspecified: Secondary | ICD-10-CM | POA: Diagnosis not present

## 2018-05-20 DIAGNOSIS — Z8249 Family history of ischemic heart disease and other diseases of the circulatory system: Secondary | ICD-10-CM | POA: Diagnosis not present

## 2018-05-20 DIAGNOSIS — N183 Chronic kidney disease, stage 3 (moderate): Secondary | ICD-10-CM | POA: Insufficient documentation

## 2018-05-20 DIAGNOSIS — Z87891 Personal history of nicotine dependence: Secondary | ICD-10-CM | POA: Diagnosis not present

## 2018-05-20 DIAGNOSIS — Z6841 Body Mass Index (BMI) 40.0 and over, adult: Secondary | ICD-10-CM | POA: Diagnosis not present

## 2018-05-20 DIAGNOSIS — R338 Other retention of urine: Secondary | ICD-10-CM | POA: Diagnosis not present

## 2018-05-20 DIAGNOSIS — E785 Hyperlipidemia, unspecified: Secondary | ICD-10-CM | POA: Diagnosis not present

## 2018-05-20 DIAGNOSIS — I428 Other cardiomyopathies: Secondary | ICD-10-CM | POA: Diagnosis not present

## 2018-05-20 DIAGNOSIS — I5082 Biventricular heart failure: Secondary | ICD-10-CM | POA: Diagnosis not present

## 2018-05-20 DIAGNOSIS — Z825 Family history of asthma and other chronic lower respiratory diseases: Secondary | ICD-10-CM | POA: Insufficient documentation

## 2018-05-20 DIAGNOSIS — I2510 Atherosclerotic heart disease of native coronary artery without angina pectoris: Secondary | ICD-10-CM | POA: Diagnosis not present

## 2018-05-20 DIAGNOSIS — Z87440 Personal history of urinary (tract) infections: Secondary | ICD-10-CM | POA: Diagnosis not present

## 2018-05-20 DIAGNOSIS — I5022 Chronic systolic (congestive) heart failure: Secondary | ICD-10-CM | POA: Diagnosis not present

## 2018-05-20 DIAGNOSIS — Z888 Allergy status to other drugs, medicaments and biological substances status: Secondary | ICD-10-CM | POA: Insufficient documentation

## 2018-05-20 DIAGNOSIS — I130 Hypertensive heart and chronic kidney disease with heart failure and stage 1 through stage 4 chronic kidney disease, or unspecified chronic kidney disease: Secondary | ICD-10-CM | POA: Diagnosis not present

## 2018-05-20 DIAGNOSIS — Z833 Family history of diabetes mellitus: Secondary | ICD-10-CM | POA: Diagnosis not present

## 2018-05-20 DIAGNOSIS — I481 Persistent atrial fibrillation: Secondary | ICD-10-CM | POA: Diagnosis not present

## 2018-05-20 DIAGNOSIS — I10 Essential (primary) hypertension: Secondary | ICD-10-CM

## 2018-05-20 DIAGNOSIS — I480 Paroxysmal atrial fibrillation: Secondary | ICD-10-CM

## 2018-05-20 DIAGNOSIS — Z79899 Other long term (current) drug therapy: Secondary | ICD-10-CM | POA: Insufficient documentation

## 2018-05-20 DIAGNOSIS — Z006 Encounter for examination for normal comparison and control in clinical research program: Secondary | ICD-10-CM

## 2018-05-20 DIAGNOSIS — M1990 Unspecified osteoarthritis, unspecified site: Secondary | ICD-10-CM | POA: Insufficient documentation

## 2018-05-20 MED ORDER — METOLAZONE 5 MG PO TABS: 5.0000 mg | ORAL_TABLET | ORAL | 0 refills | Status: DC | PRN

## 2018-05-20 MED ORDER — FUROSEMIDE 20 MG PO TABS: 40.0000 mg | ORAL_TABLET | Freq: Every day | ORAL | 3 refills | Status: DC

## 2018-05-20 NOTE — Progress Notes (Signed)
Advanced Heart Failure Clinic Note    Primary Cardiologist: Dr. [name] Primary HF: [name]  HPI: Patricia Wagner is a 67 y.o. female with a past medical history of paroxysmal atrial fibrillation, NICM cardiomyopathy, Biventricular CHF (LVEF 15% and RV severely reduced by TEE 03/25/17), HTN, and HLD.   She has struggled with symptomatic afib since 10/2016. Had successful DCCV 10/2016. Failed DCCV 03/2017, after stopping her amiodarone. Seen in A-Fib clinic 04/09/17 and planned for repeat DCCV after further amio load.   Pt admitted to [facility] 6/6 - 05/16/17 with cardiogenic shock in the setting of persistent AF. Had tach CM with EF 15% with severe biventricular dysfunction. Started on amio gtt and PICC line placed for dobutamine with low output HF. Underwent DCCV 05/06/17 but went back into Afib 05/08/17 and developed recurrent cardiogenic shock, requiring re-initiation of dobutamine and amio. Pt had repeat DCCV 05/12/17 with successful conversion to NSR. Discharged on amio and Ranexa.  Last echocardiogram August 2018 showed ejection fraction 30-35%,severe left ventricular hypertrophy, mild to moderate mitral regurgitation, biatrial enlargement and moderately reduced RV function. Fat pad bx negative. PYP 10/18 felt equivocal but not favored for amyloid. Patient has been scheduled for atrial fibrillation ablation January 2019 as she has had cardiogenic shock with atrial fibrillation previously.   Admitted 2/5 through 01/08/2018 with UTI and AKI. Found to have neurogenic bladder with urinary retention .Creatinine peaked at 7. Lasix, spiro, and entresto stopped. Required home IV antibiotics and discharged with foley. At the time of discharge creatinine was down to 1.07.   Admitted 03/05/18 and again on 4/22 with lower GI bleed. Initially was in setting of INR > 10, but transitioned from coumadin to Xarelto after first admission. Pt had colonoscopy in 11/2017 which showed diverticular disease, so no repeat  performed due to supratherapeutic INR. GI consulted. Tagged NM scan 03/15/18 with no active bleeding. Left off of AC for now.  She presents today for regular follow up. Last visit, eliquis was restarted and hydralazine was increased. Overall doing okay. She denies SOB or orthopnea. She has BLE edema. Weights up 15 lbs. PCP put her on lasix 20 daily with no effect. Saw nephrologist who increased to 40 daily but still just mild response. Feels like swelling has come down a little bit. Still edematous. Appetite okay. No CP, dizziness, or palpitations. Denies bleeding on Eliquis. Going to get iron infusion next month. Taking all meds. Limits salt intake. Rarely eats out. Limits fluid intake.    Echo from 07/15/17 EF 30-35% Severe LVH. RV down.   Review of systems complete and found to be negative unless listed in HPI.   Past Medical History:  Diagnosis Date  . Arthritis   . Atrial fibrillation ([place])    a. s/p multiple cardioversions; failed tikosyn/sotalol.  Marland Kitchen CAD in native artery, s/p cardiac cath with non obstructive CAD 10/24/2016  . CHF (congestive heart failure) ([place])   . CKD (chronic kidney disease) stage 3, GFR 30-59 ml/min (H[facility])   . DIVERTICULITIS, HX OF 07/25/2007  . DIVERTICULOSIS, COLON 07/22/2007  . Dyspnea   . Edema, peripheral    a. chronic BLE edema, R>L. Prior trauma from dog attack and accident.  Marland Kitchen HYPERLIPIDEMIA 02/03/2008  . Hypersomnia    declines w/u  . Hypertension   . MENOPAUSAL DISORDER 01/09/2011  . Morbid obesity ([place]) 07/22/2007  . NICM (nonischemic cardiomyopathy) ([place]) 10/24/2016  . Raynaud's syndrome 07/22/2007  . Stroke ([place]) 2017  . THYROID NODULE, RIGHT 01/04/2010  . VITAMIN D  DEFICIENCY 01/09/2011   Qualifier: Diagnosis of  By: [name], [name]     Current Outpatient Medications  Medication Sig Dispense Refill  . amiodarone (PACERONE) 200 MG tablet Take 1 tablet (200 mg total) by mouth daily. 30 tablet 0  . apixaban (ELIQUIS) 5 MG TABS tablet Take 1 tablet (5  mg total) by mouth 2 (two) times daily. 60 tablet 11  . carvedilol (COREG) 3.125 MG tablet Take 1 tablet (3.125 mg total) by mouth 2 (two) times daily. 180 tablet 3  . furosemide (LASIX) 20 MG tablet Take 1 tablet (20 mg total) by mouth as needed. Take 1 tablet daily as needed for weight gain of 3 lbs overnight or 5 lbs in a week. 30 tablet 3  . hydrALAZINE (APRESOLINE) 50 MG tablet Take 1 tablet (50 mg total) by mouth 3 (three) times daily. 90 tablet 11  . Investigational - Study Medication Take 1 tablet by mouth 2 (two) times daily. Study name: [facility] HF Study  Additional study details: Omecamtiv Mecarbil or Placebo 1 each PRN  . isosorbide mononitrate (IMDUR) 60 MG 24 hr tablet Take 1 tablet (60 mg total) by mouth daily. 30 tablet 11  . ranolazine (RANEXA) 500 MG 12 hr tablet TAKE 1 TABLET BY MOUTH TWICE A DAY 60 tablet 5  . rosuvastatin (CRESTOR) 10 MG tablet Take 10 mg by mouth daily.     No current facility-administered medications for this encounter.     Allergies  Allergen Reactions  . Ace Inhibitors Palpitations     Social History   Socioeconomic History  . Marital status: Single    Spouse name: Not on file  . Number of children: 1  . Years of education: Not on file  . Highest education level: Not on file  Occupational History  . Occupation: Engineer, manufacturing  . Financial resource strain: Not on file  . Food insecurity:    Worry: Not on file    Inability: Not on file  . Transportation needs:    Medical: Not on file    Non-medical: Not on file  Tobacco Use  . Smoking status: Former Smoker    Packs/day: 0.50    Types: Cigarettes    Last attempt to quit: 10/23/2016    Years since quitting: 1.5  . Smokeless tobacco: Never Used  . Tobacco comment: Smoked for 50 years  Substance and Sexual Activity  . Alcohol use: No    Alcohol/week: 2.4 oz    Types: 4 Cans of beer per week    Frequency: Never    Comment: Intermittent  . Drug use: No  . Sexual activity:  Not on file  Lifestyle  . Physical activity:    Days per week: Not on file    Minutes per session: Not on file  . Stress: Not on file  Relationships  . Social connections:    Talks on phone: Not on file    Gets together: Not on file    Attends religious service: Not on file    Active member of club or organization: Not on file    Attends meetings of clubs or organizations: Not on file    Relationship status: Not on file  . Intimate partner violence:    Fear of current or ex partner: Not on file    Emotionally abused: Not on file    Physically abused: Not on file    Forced sexual activity: Not on file  Other Topics Concern  .  Not on file  Social History Narrative  . Not on file     Family History  Problem Relation Age of Onset  . Asthma Mother   . Diabetes Father   . Heart disease Father        Died of presumed heart attack - 74s  . Lung disease Sister   . Heart disease Sister        Twin sister has heart issue, unclear what kind  . Thyroid disease Neg Hx     Vitals:   05/20/18 1340  BP: (!) 127/56  Pulse: 67  SpO2: 94%  Weight: 244 lb 6.4 oz (110.9 kg)    Wt Readings from Last 3 Encounters:  05/20/18 244 lb 6.4 oz (110.9 kg)  04/15/18 231 lb 6.4 oz (105 kg)  04/13/18 229 lb 9.6 oz (104.1 kg)    PHYSICAL EXAM: General:  No resp difficulty. HEENT: Normal anicteric Neck: Supple. JVP ~10. Carotids 2+ bilat; no bruits. No thyromegaly or nodule noted. Cor: PMI nondisplaced. RRR, 2/6 TR Lungs: crackles in bases no wheeze Abdomen: Obese Soft, non-tender, non-distended, no HSM. No bruits or masses. +BS  Extremities: No cyanosis, clubbing, or rash. R and LLE 2+ edema to knees Neuro: alert & oriented x 3, cranial nerves grossly intact. moves all 4 extremities w/o difficulty. Affect pleasant   ASSESSMENT & PLAN:  1. Chronic biventricular CHF due to NICM (suspect AF cardiomyopathy) - TEE 03/25/17 with LVEF 15% and severely reduced RV function.   - Echo  06/2017  EF  30-35% (up from 15% in setting of AF in 5/18).  - Echo 02/2018: EF 40-45%, grade 1 DD, RV normal - Required dobutamine during previous admission with cardiogenic shock in setting of Afib RVR - Amyloid w/u negative with negative fat pad biopsy and PYP scan  - NYHA II - Volume status elevated on exam.  - Increase lasix to 80 mg daily x3 days, then back to 40 mg daily. Take 2.5 mg metolazone Fri or Sat if not responding well to lasix.  - Continue imdur 60 mg daily and hydralazine 50 mg TID (unable to afford Bidil) - Continue coreg 3.125 mg BID - Off spiro and Entresto with recent AKI. Creatinine 1.59, K 4.4 last month.  - Encouraged daily use of TED hose. - Discussed importance of limiting fluid intake, limiting salt intake, and daily weights    2. Recurrent GI bleed - Tagged NM scan 03/15/18 negative - Note per Dr. Hilarie Fredrickson 03/16/18 states likely Diverticular source.  - No s/s bleeding. CBC today.  - Start Eliquis 5 mg BID. See below.   3. Persistent Afib: Cannot tolerate AF with recurrent shock. S/p DCCV on 5/2 (she stopped Amio after this) and DCCV on 05/06/17 and again 05/12/17. - She absolutely cannot tolerate AF with recurrent cardiogenic shock when AF recurs. - Continue Ranexa 500 mg BID.  - Continue amiodarone 200 mg daily. LFTs okay 02/2018, TSH okay 03/2018 - CHA2DS2-VASc is at least 5 (CHF, HTN, Stroke, Female), she will gain another point for age in August.  - Saw Dr Curt Bears in May. He plans to schedule for ablation once she is back on anticoagulation. - Now back on Eliquis 5 mg BID.  Started 4 weeks after GIB r/t INR >10  4. Suspected Sleep Apnea: - Refuses sleep study. No change.   5.  COPD:  - No wheezing.  6. Recent AKI in the setting of urinary retention and UTI - Creatinine peaked at 7 during recent hospitalization  in February. Creatinine 1.59 03/2018 - Avoid NSAIDs.  - Has chronic catheter now - follows with urology  7. HTN - Well controlled today.   8. Anemia -  Receiving ferraheme OP - Hemoglobin 8.7 5/23 - Denies bleeding.   Follow up in 3 months  Georgiana Shore, NP 05/20/18    Patient seen and examined with the above-signed Advanced Practice Provider and/or Housestaff. I personally reviewed laboratory data, imaging studies and relevant notes. I independently examined the patient and formulated the important aspects of the plan. I have edited the note to reflect any of my changes or salient points. I have personally discussed the plan with the patient and/or family.  She is maintaining NSR but has significant volume overload not responding to lasix 40 daily. Will increase lasix to 80 daily for 3 days and then back to 40 daily. If no response by tomorrow will have her take metolazone 2.5 for 1-2 days. Needs repeat echo. She is now back on [facility]. Will refer back to EP for AF ablation as she absolutely cannot tolerate AF. Continue amio Continue other HF meds for now.  Glori Bickers, MD  10:57 PM

## 2018-05-20 NOTE — Patient Instructions (Signed)
INCREASE Lasix to 80 mg (4 Tablets) daily for 3 days only. Then START taking 40 mg (2 Tablets) Once Daily.   Take 2.5 mg (0.5 Tablet) of Metolazone on Saturday morning if poor response to lasix.  Take Metolazone 30 mins prior to lasix.    Follow up in 3 Months.

## 2018-05-21 ENCOUNTER — Other Ambulatory Visit (HOSPITAL_COMMUNITY): Payer: Self-pay

## 2018-05-21 NOTE — Progress Notes (Signed)
Late Entry- Patricia Wagner to research clinic on 05-20-18 for [place] visit in the [place] study. AE/SAEs since last research visit have been reported to sponsor. Patient returned medication with 62% compliance, new IP dispensed. Next appointment scheduled.

## 2018-05-21 NOTE — Addendum Note (Signed)
Encounter addended by: Shirley Muscat, RN on: 05/21/2018 2:27 PM  Actions taken: Order list changed

## 2018-05-24 ENCOUNTER — Encounter (HOSPITAL_COMMUNITY)
Admission: RE | Admit: 2018-05-24 | Discharge: 2018-05-24 | Disposition: A | Discharge status: Home / Self Care | Payer: BLUE CROSS/BLUE SHIELD | Source: Ambulatory Visit | Attending: Nephrology | Admitting: Nephrology

## 2018-05-24 ENCOUNTER — Telehealth: Payer: Self-pay

## 2018-05-24 VITALS — BP 129/67 | HR 56 | Temp 98.2°F | Resp 18 | Ht 65.0 in | Wt 240.0 lb

## 2018-05-24 DIAGNOSIS — N183 Chronic kidney disease, stage 3 unspecified: Secondary | ICD-10-CM

## 2018-05-24 DIAGNOSIS — D631 Anemia in chronic kidney disease: Secondary | ICD-10-CM | POA: Insufficient documentation

## 2018-05-24 LAB — POCT HEMOGLOBIN-HEMACUE: Hemoglobin: 8.2 g/dL — ABNORMAL LOW (ref 12.0–15.0)

## 2018-05-24 MED ORDER — EPOETIN ALFA 10000 UNIT/ML IJ SOLN
INTRAMUSCULAR | Status: AC
  Filled 2018-05-24: qty 1

## 2018-05-24 MED ORDER — EPOETIN ALFA 10000 UNIT/ML IJ SOLN
5000.0000 [IU] | INTRAMUSCULAR | Status: DC
  Administered 2018-05-24: 5000 [IU] via SUBCUTANEOUS

## 2018-05-24 MED ORDER — FERUMOXYTOL INJECTION 510 MG/17 ML
510.0000 mg | INTRAVENOUS | Status: DC
  Administered 2018-05-24: 510 mg via INTRAVENOUS
  Filled 2018-05-24: qty 17

## 2018-05-24 NOTE — Discharge Instructions (Signed)
Ferumoxytol injection °What is this medicine? °FERUMOXYTOL is an iron complex. Iron is used to make healthy red blood cells, which carry oxygen and nutrients throughout the body. This medicine is used to treat iron deficiency anemia in people with chronic kidney disease. °This medicine may be used for other purposes; ask your health care provider or pharmacist if you have questions. °COMMON BRAND NAME(S): Feraheme °What should I tell my health care provider before I take this medicine? °They need to know if you have any of these conditions: °-anemia not caused by low iron levels °-high levels of iron in the blood °-magnetic resonance imaging (MRI) test scheduled °-an unusual or allergic reaction to iron, other medicines, foods, dyes, or preservatives °-pregnant or trying to get pregnant °-breast-feeding °How should I use this medicine? °This medicine is for injection into a vein. It is given by a health care professional in a hospital or clinic setting. °Talk to your pediatrician regarding the use of this medicine in children. Special care may be needed. °Overdosage: If you think you have taken too much of this medicine contact a poison control center or emergency room at once. °NOTE: This medicine is only for you. Do not share this medicine with others. °What if I miss a dose? °It is important not to miss your dose. Call your doctor or health care professional if you are unable to keep an appointment. °What may interact with this medicine? °This medicine may interact with the following medications: °-other iron products °This list may not describe all possible interactions. Give your health care provider a list of all the medicines, herbs, non-prescription drugs, or dietary supplements you use. Also tell them if you smoke, drink alcohol, or use illegal drugs. Some items may interact with your medicine. °What should I watch for while using this medicine? °Visit your doctor or healthcare professional regularly. Tell  your doctor or healthcare professional if your symptoms do not start to get better or if they get worse. You may need blood work done while you are taking this medicine. °You may need to follow a special diet. Talk to your doctor. Foods that contain iron include: whole grains/cereals, dried fruits, beans, or peas, leafy green vegetables, and organ meats (liver, kidney). °What side effects may I notice from receiving this medicine? °Side effects that you should report to your doctor or health care professional as soon as possible: °-allergic reactions like skin rash, itching or hives, swelling of the face, lips, or tongue °-breathing problems °-changes in blood pressure °-feeling faint or lightheaded, falls °-fever or chills °-flushing, sweating, or hot feelings °-swelling of the ankles or feet °Side effects that usually do not require medical attention (report to your doctor or health care professional if they continue or are bothersome): °-diarrhea °-headache °-nausea, vomiting °-stomach pain °This list may not describe all possible side effects. Call your doctor for medical advice about side effects. You may report side effects to FDA at 1-800-FDA-1088. °Where should I keep my medicine? °This drug is given in a hospital or clinic and will not be stored at home. °NOTE: This sheet is a summary. It may not cover all possible information. If you have questions about this medicine, talk to your doctor, pharmacist, or health care provider. °© 2018 Elsevier/Gold Standard (2015-12-13 12:41:49) ° ° °Epoetin Alfa injection °What is this medicine? °EPOETIN ALFA (e POE e tin AL fa) helps your body make more red blood cells. This medicine is used to treat anemia caused by chronic kidney   failure, cancer chemotherapy, or HIV-therapy. It may also be used before surgery if you have anemia. °This medicine may be used for other purposes; ask your health care provider or pharmacist if you have questions. °COMMON BRAND NAME(S):  Epogen, Procrit °What should I tell my health care provider before I take this medicine? °They need to know if you have any of these conditions: °-blood clotting disorders °-cancer patient not on chemotherapy °-cystic fibrosis °-heart disease, such as angina or heart failure °-hemoglobin level of 12 g/dL or greater °-high blood pressure °-low levels of folate, iron, or vitamin B12 °-seizures °-an unusual or allergic reaction to erythropoietin, albumin, benzyl alcohol, hamster proteins, other medicines, foods, dyes, or preservatives °-pregnant or trying to get pregnant °-breast-feeding °How should I use this medicine? °This medicine is for injection into a vein or under the skin. It is usually given by a health care professional in a hospital or clinic setting. °If you get this medicine at home, you will be taught how to prepare and give this medicine. Use exactly as directed. Take your medicine at regular intervals. Do not take your medicine more often than directed. °It is important that you put your used needles and syringes in a special sharps container. Do not put them in a trash can. If you do not have a sharps container, call your pharmacist or healthcare provider to get one. °A special MedGuide will be given to you by the pharmacist with each prescription and refill. Be sure to read this information carefully each time. °Talk to your pediatrician regarding the use of this medicine in children. While this drug may be prescribed for selected conditions, precautions do apply. °Overdosage: If you think you have taken too much of this medicine contact a poison control center or emergency room at once. °NOTE: This medicine is only for you. Do not share this medicine with others. °What if I miss a dose? °If you miss a dose, take it as soon as you can. If it is almost time for your next dose, take only that dose. Do not take double or extra doses. °What may interact with this medicine? °Do not take this medicine with  any of the following medications: °-darbepoetin alfa °This list may not describe all possible interactions. Give your health care provider a list of all the medicines, herbs, non-prescription drugs, or dietary supplements you use. Also tell them if you smoke, drink alcohol, or use illegal drugs. Some items may interact with your medicine. °What should I watch for while using this medicine? °Your condition will be monitored carefully while you are receiving this medicine. °You may need blood work done while you are taking this medicine. °What side effects may I notice from receiving this medicine? °Side effects that you should report to your doctor or health care professional as soon as possible: °-allergic reactions like skin rash, itching or hives, swelling of the face, lips, or tongue °-breathing problems °-changes in vision °-chest pain °-confusion, trouble speaking or understanding °-feeling faint or lightheaded, falls °-high blood pressure °-muscle aches or pains °-pain, swelling, warmth in the leg °-rapid weight gain °-severe headaches °-sudden numbness or weakness of the face, arm or leg °-trouble walking, dizziness, loss of balance or coordination °-seizures (convulsions) °-swelling of the ankles, feet, hands °-unusually weak or tired °Side effects that usually do not require medical attention (report to your doctor or health care professional if they continue or are bothersome): °-diarrhea °-fever, chills (flu-like symptoms) °-headaches °-nausea, vomiting °-redness, stinging,   or swelling at site where injected °This list may not describe all possible side effects. Call your doctor for medical advice about side effects. You may report side effects to FDA at 1-800-FDA-1088. °Where should I keep my medicine? °Keep out of the reach of children. °Store in a refrigerator between 2 and 8 degrees C (36 and 46 degrees F). Do not freeze or shake. Throw away any unused portion if using a single-dose vial. Multi-dose  vials can be kept in the refrigerator for up to 21 days after the initial dose. Throw away unused medicine. °NOTE: This sheet is a summary. It may not cover all possible information. If you have questions about this medicine, talk to your doctor, pharmacist, or health care provider. °© 2018 Elsevier/Gold Standard (2016-06-30 19:42:31) ° °

## 2018-05-24 NOTE — Telephone Encounter (Signed)
Spoke with patient regarding lab results that are out of normal range.  Requested patient come to HF clinic to obtain repeat labs ASAP.  Pt refused to come today, but agreed to come tomorrow 05/25/18 at noon.  Explained the importance and urgency to obtain repeat labs, especially potassium.  Patient verbalized understanding.

## 2018-05-25 ENCOUNTER — Telehealth (HOSPITAL_COMMUNITY): Payer: Self-pay | Admitting: *Deleted

## 2018-05-25 ENCOUNTER — Ambulatory Visit (HOSPITAL_COMMUNITY)
Admission: RE | Admit: 2018-05-25 | Discharge: 2018-05-25 | Disposition: A | Discharge status: Home / Self Care | Payer: BLUE CROSS/BLUE SHIELD | Source: Ambulatory Visit | Attending: Internal Medicine | Admitting: Internal Medicine

## 2018-05-25 DIAGNOSIS — I5022 Chronic systolic (congestive) heart failure: Secondary | ICD-10-CM | POA: Insufficient documentation

## 2018-05-25 LAB — BASIC METABOLIC PANEL
Anion gap: 10 (ref 5–15)
BUN: 37 mg/dL — ABNORMAL HIGH (ref 8–23)
CALCIUM: 9 mg/dL (ref 8.9–10.3)
CHLORIDE: 98 mmol/L (ref 98–111)
CO2: 33 mmol/L — AB (ref 22–32)
CREATININE: 2.41 mg/dL — AB (ref 0.44–1.00)
GFR calc Af Amer: 23 mL/min — ABNORMAL LOW (ref >60)
GFR calc non Af Amer: 20 mL/min — ABNORMAL LOW (ref >60)
GLUCOSE: 103 mg/dL — AB (ref 70–99)
Potassium: 3 mmol/L — ABNORMAL LOW (ref 3.5–5.1)
Sodium: 141 mmol/L (ref 135–145)

## 2018-05-25 MED ORDER — METOLAZONE 2.5 MG PO TABS: 2.5000 mg | ORAL_TABLET | ORAL | 3 refills | Status: DC | PRN

## 2018-05-25 NOTE — Telephone Encounter (Signed)
Patient walked into clinic today complaining of swelling. Patient's weight is 236.4 lb in clinic.  Per Darrick Grinder, NP patient instructed to take 2.5 mg of Metolazone once today.  Advised her to call us if she continues to have swelling. Medication sent to pharmacy.

## 2018-05-26 ENCOUNTER — Other Ambulatory Visit: Payer: Self-pay | Admitting: Cardiology

## 2018-05-26 ENCOUNTER — Telehealth (HOSPITAL_COMMUNITY): Payer: Self-pay

## 2018-05-26 NOTE — Telephone Encounter (Signed)
Patients insurance is active and benefits verified through [employer] - no co-pay, deductible amount of $250.00/$250.00 has been met, out of pocket amount of $600.00/$600.00 has been met, 30% co-insurance, and no pre-authorization is required. Passport/reference [id number]  Will contact patient to see if she is interested in the Cardiac Rehab Program. If interested, patient will be contacted for scheduling upon review by the RN Navigator.

## 2018-05-28 ENCOUNTER — Telehealth (HOSPITAL_COMMUNITY): Payer: Self-pay

## 2018-05-28 NOTE — Telephone Encounter (Signed)
Called patient in regards to insurance. Patient stated she will be switching over to Medicare. Explained why she would have to participate in the Pulmonary Rehab Program. Patient was okay with that. Will contact Katrina to inform her that we will send a Pulmonary order for Dr.Bensimhon to sign. Will close this referral.

## 2018-05-28 NOTE — Telephone Encounter (Signed)
Attempted to call patient to see if she is interested in the Cardiac Rehab Program - lm on vm °

## 2018-05-31 ENCOUNTER — Telehealth (HOSPITAL_COMMUNITY): Payer: Self-pay | Admitting: *Deleted

## 2018-05-31 ENCOUNTER — Ambulatory Visit (HOSPITAL_COMMUNITY)
Admission: RE | Admit: 2018-05-31 | Discharge: 2018-05-31 | Disposition: A | Discharge status: Home / Self Care | Payer: BLUE CROSS/BLUE SHIELD | Source: Ambulatory Visit | Attending: Nephrology | Admitting: Nephrology

## 2018-05-31 VITALS — BP 131/79 | HR 61 | Temp 99.1°F | Resp 16

## 2018-05-31 DIAGNOSIS — N183 Chronic kidney disease, stage 3 unspecified: Secondary | ICD-10-CM

## 2018-05-31 DIAGNOSIS — D631 Anemia in chronic kidney disease: Secondary | ICD-10-CM | POA: Diagnosis present

## 2018-05-31 DIAGNOSIS — I5022 Chronic systolic (congestive) heart failure: Secondary | ICD-10-CM

## 2018-05-31 LAB — POCT HEMOGLOBIN-HEMACUE: HEMOGLOBIN: 10.6 g/dL — AB (ref 12.0–15.0)

## 2018-05-31 MED ORDER — EPOETIN ALFA 10000 UNIT/ML IJ SOLN
INTRAMUSCULAR | Status: AC
Start: 2018-05-31 — End: 2018-05-31
  Administered 2018-05-31: 5000 [IU] via SUBCUTANEOUS
  Filled 2018-05-31: qty 1

## 2018-05-31 MED ORDER — POTASSIUM CHLORIDE CRYS ER 20 MEQ PO TBCR: 80.0000 meq | EXTENDED_RELEASE_TABLET | Freq: Once | ORAL | 0 refills | Status: DC

## 2018-05-31 MED ORDER — SODIUM CHLORIDE 0.9 % IV SOLN
510.0000 mg | INTRAVENOUS | Status: DC
  Administered 2018-05-31: 510 mg via INTRAVENOUS
  Filled 2018-05-31: qty 17

## 2018-05-31 MED ORDER — EPOETIN ALFA 10000 UNIT/ML IJ SOLN
5000.0000 [IU] | INTRAMUSCULAR | Status: DC
  Administered 2018-05-31: 5000 [IU] via SUBCUTANEOUS

## 2018-05-31 NOTE — Telephone Encounter (Signed)
Result Notes for Basic metabolic panel   Notes recorded by Darron Doom, RN on 05/31/2018 at 1:00 PM EDT Patient called back and she is agreeable with plan. She will hold lasix for 3 days and take 80 of potassium today. Lab appt scheduled, bmet ordered, and potassium sent to pharmacy. ------  Notes recorded by Scarlette Calico, RN on 05/28/2018 at 4:49 PM EDT Left message to call back ------  Notes recorded by Scarlette Calico, RN on 05/28/2018 at 3:24 PM EDT Per Dr [name], pt needs to hold Lasix for 3 days, take KCL 80 meq x1 dose, repeat labs Monday and again in 1 week, attempted to call pt and Left message to call back

## 2018-06-03 ENCOUNTER — Other Ambulatory Visit: Payer: Self-pay | Admitting: Obstetrics and Gynecology

## 2018-06-03 DIAGNOSIS — N6320 Unspecified lump in the left breast, unspecified quadrant: Secondary | ICD-10-CM

## 2018-06-07 ENCOUNTER — Ambulatory Visit (HOSPITAL_COMMUNITY)
Admission: RE | Admit: 2018-06-07 | Discharge: 2018-06-07 | Disposition: A | Discharge status: Home / Self Care | Payer: BLUE CROSS/BLUE SHIELD | Source: Ambulatory Visit | Attending: Nephrology | Admitting: Nephrology

## 2018-06-07 ENCOUNTER — Telehealth (HOSPITAL_COMMUNITY): Payer: Self-pay | Admitting: *Deleted

## 2018-06-07 ENCOUNTER — Other Ambulatory Visit (HOSPITAL_COMMUNITY): Payer: BLUE CROSS/BLUE SHIELD

## 2018-06-07 ENCOUNTER — Encounter (HOSPITAL_COMMUNITY): Payer: BLUE CROSS/BLUE SHIELD

## 2018-06-07 ENCOUNTER — Ambulatory Visit (HOSPITAL_COMMUNITY)
Admission: RE | Admit: 2018-06-07 | Discharge: 2018-06-07 | Disposition: A | Discharge status: Home / Self Care | Payer: BLUE CROSS/BLUE SHIELD | Source: Ambulatory Visit | Attending: Cardiology | Admitting: Cardiology

## 2018-06-07 VITALS — BP 118/64 | HR 60 | Resp 16

## 2018-06-07 DIAGNOSIS — N183 Chronic kidney disease, stage 3 unspecified: Secondary | ICD-10-CM

## 2018-06-07 DIAGNOSIS — N189 Chronic kidney disease, unspecified: Secondary | ICD-10-CM | POA: Diagnosis not present

## 2018-06-07 DIAGNOSIS — D631 Anemia in chronic kidney disease: Secondary | ICD-10-CM | POA: Insufficient documentation

## 2018-06-07 DIAGNOSIS — I5022 Chronic systolic (congestive) heart failure: Secondary | ICD-10-CM

## 2018-06-07 LAB — BASIC METABOLIC PANEL
Anion gap: 11 (ref 5–15)
BUN: 28 mg/dL — AB (ref 8–23)
CALCIUM: 9.2 mg/dL (ref 8.9–10.3)
CO2: 33 mmol/L — ABNORMAL HIGH (ref 22–32)
CREATININE: 2.17 mg/dL — AB (ref 0.44–1.00)
Chloride: 99 mmol/L (ref 98–111)
GFR, EST AFRICAN AMERICAN: 26 mL/min — AB (ref >60)
GFR, EST NON AFRICAN AMERICAN: 23 mL/min — AB (ref >60)
Glucose, Bld: 92 mg/dL (ref 70–99)
Potassium: 2.8 mmol/L — ABNORMAL LOW (ref 3.5–5.1)
SODIUM: 143 mmol/L (ref 135–145)

## 2018-06-07 LAB — POCT HEMOGLOBIN-HEMACUE: HEMOGLOBIN: 10.5 g/dL — AB (ref 12.0–15.0)

## 2018-06-07 MED ORDER — POTASSIUM CHLORIDE CRYS ER 20 MEQ PO TBCR: EXTENDED_RELEASE_TABLET | ORAL | 6 refills | Status: DC

## 2018-06-07 MED ORDER — EPOETIN ALFA 10000 UNIT/ML IJ SOLN
5000.0000 [IU] | INTRAMUSCULAR | Status: DC
  Administered 2018-06-07: 5000 [IU] via SUBCUTANEOUS

## 2018-06-07 MED ORDER — EPOETIN ALFA 10000 UNIT/ML IJ SOLN
INTRAMUSCULAR | Status: AC
  Filled 2018-06-07: qty 1

## 2018-06-07 NOTE — Telephone Encounter (Signed)
Notes recorded by Scarlette Calico, RN on 06/07/2018 at 4:48 PM EDT Per Oda Kilts, PA have pt start KCL, take 80 meq first day then 40 meq daily, recheck in 1 week. Pt is aware, agreeable and verbalized understanding, rx sent in, repeat lab sch for 7/22

## 2018-06-11 ENCOUNTER — Ambulatory Visit: Payer: BLUE CROSS/BLUE SHIELD

## 2018-06-11 ENCOUNTER — Ambulatory Visit
Admission: RE | Admit: 2018-06-11 | Discharge: 2018-06-11 | Disposition: A | Discharge status: Home / Self Care | Payer: BLUE CROSS/BLUE SHIELD | Source: Ambulatory Visit | Attending: Obstetrics and Gynecology | Admitting: Obstetrics and Gynecology

## 2018-06-11 DIAGNOSIS — N6320 Unspecified lump in the left breast, unspecified quadrant: Secondary | ICD-10-CM

## 2018-06-14 ENCOUNTER — Ambulatory Visit (HOSPITAL_COMMUNITY)
Admission: RE | Admit: 2018-06-14 | Discharge: 2018-06-14 | Disposition: A | Discharge status: Home / Self Care | Payer: BLUE CROSS/BLUE SHIELD | Source: Ambulatory Visit | Attending: Nephrology | Admitting: Nephrology

## 2018-06-14 VITALS — BP 133/83 | HR 58 | Temp 98.8°F | Resp 18

## 2018-06-14 DIAGNOSIS — D631 Anemia in chronic kidney disease: Secondary | ICD-10-CM | POA: Diagnosis present

## 2018-06-14 DIAGNOSIS — I5022 Chronic systolic (congestive) heart failure: Secondary | ICD-10-CM

## 2018-06-14 DIAGNOSIS — N183 Chronic kidney disease, stage 3 unspecified: Secondary | ICD-10-CM

## 2018-06-14 DIAGNOSIS — N189 Chronic kidney disease, unspecified: Secondary | ICD-10-CM | POA: Diagnosis not present

## 2018-06-14 LAB — BASIC METABOLIC PANEL
Anion gap: 8 (ref 5–15)
BUN: 30 mg/dL — ABNORMAL HIGH (ref 8–23)
CALCIUM: 9.2 mg/dL (ref 8.9–10.3)
CO2: 30 mmol/L (ref 22–32)
CREATININE: 2.37 mg/dL — AB (ref 0.44–1.00)
Chloride: 104 mmol/L (ref 98–111)
GFR calc Af Amer: 24 mL/min — ABNORMAL LOW (ref >60)
GFR calc non Af Amer: 21 mL/min — ABNORMAL LOW (ref >60)
Glucose, Bld: 100 mg/dL — ABNORMAL HIGH (ref 70–99)
Potassium: 4.8 mmol/L (ref 3.5–5.1)
SODIUM: 142 mmol/L (ref 135–145)

## 2018-06-14 MED ORDER — EPOETIN ALFA 10000 UNIT/ML IJ SOLN
5000.0000 [IU] | INTRAMUSCULAR | Status: DC
  Administered 2018-06-14: 5000 [IU] via SUBCUTANEOUS

## 2018-06-14 MED ORDER — EPOETIN ALFA 10000 UNIT/ML IJ SOLN
INTRAMUSCULAR | Status: AC
  Filled 2018-06-14: qty 1

## 2018-06-15 ENCOUNTER — Telehealth (HOSPITAL_COMMUNITY): Payer: Self-pay | Admitting: Cardiology

## 2018-06-15 LAB — POCT HEMOGLOBIN-HEMACUE: Hemoglobin: 10.8 g/dL — ABNORMAL LOW (ref 12.0–15.0)

## 2018-06-15 MED ORDER — POTASSIUM CHLORIDE CRYS ER 20 MEQ PO TBCR: 20.0000 meq | EXTENDED_RELEASE_TABLET | Freq: Every day | ORAL | 6 refills | Status: DC

## 2018-06-15 NOTE — Telephone Encounter (Signed)
Notes recorded by Kerry Dory, CMA on 06/15/2018 at 12:01 PM EDT Patient aware. Patient reports she is only doing minimally better, reports she still has some fluid on her. Mild SOB, LE edema, weight fluctuates between 230-248 lbs. Advised of medication changes and labs follow up 8/1  ------  Notes recorded by Kerry Dory, CMA on 06/14/2018 at 3:39 PM EDT Left message for patient to call back. [phone] (M) ------  Notes recorded by Shirley Friar, PA-C on 06/14/2018 at 2:38 PM EDT  Please check on her weight and symptoms. Cut potassium back to 20 meq daily. Needs repeat 7-10 days. Please explain we are fine-tuning her medications and should be able to spread out her labs "soon".   Discussed above with [name].  Legrand Como [address]" [place], PA-C 06/14/2018 2:35 PM

## 2018-06-15 NOTE — Telephone Encounter (Signed)
-----   Message from Shirley Friar, PA-C sent at 06/14/2018  2:38 PM EDT -----  Please check on her weight and symptoms.  Cut potassium back to 20 meq daily. Needs repeat 7-10 days.  Please explain we are fine-tuning her medications and should be able to spread out her labs "soon".   Discussed above with [name].  Legrand Como [address]" [place], PA-C 06/14/2018 2:35 PM

## 2018-06-21 ENCOUNTER — Ambulatory Visit (HOSPITAL_COMMUNITY)
Admission: RE | Admit: 2018-06-21 | Discharge: 2018-06-21 | Disposition: A | Discharge status: Home / Self Care | Payer: BLUE CROSS/BLUE SHIELD | Source: Ambulatory Visit | Attending: Nephrology | Admitting: Nephrology

## 2018-06-21 VITALS — BP 131/9 | HR 66 | Temp 98.1°F | Ht 65.0 in | Wt 234.0 lb

## 2018-06-21 DIAGNOSIS — D631 Anemia in chronic kidney disease: Secondary | ICD-10-CM | POA: Insufficient documentation

## 2018-06-21 DIAGNOSIS — N183 Chronic kidney disease, stage 3 unspecified: Secondary | ICD-10-CM

## 2018-06-21 LAB — POCT HEMOGLOBIN-HEMACUE: Hemoglobin: 12.1 g/dL (ref 12.0–15.0)

## 2018-06-21 MED ORDER — EPOETIN ALFA 10000 UNIT/ML IJ SOLN: 5000.0000 [IU] | INTRAMUSCULAR | Status: DC

## 2018-06-22 ENCOUNTER — Telehealth (HOSPITAL_COMMUNITY): Payer: Self-pay

## 2018-06-22 NOTE — Telephone Encounter (Signed)
Patient returned phone call in regards to Pulmonary Rehab - Patient is interested in the program. Scheduled orientation on 07/07/18 at 1:30pm. Patient will attend the 1:30pm exc class. Mailed packet.

## 2018-06-22 NOTE — Telephone Encounter (Signed)
Attempted to contact patient in regards to Pulmonary Rehab - lm on vm °

## 2018-06-24 ENCOUNTER — Other Ambulatory Visit (HOSPITAL_COMMUNITY): Payer: BLUE CROSS/BLUE SHIELD

## 2018-06-24 ENCOUNTER — Other Ambulatory Visit (HOSPITAL_COMMUNITY): Payer: Self-pay | Admitting: *Deleted

## 2018-06-24 DIAGNOSIS — I5022 Chronic systolic (congestive) heart failure: Secondary | ICD-10-CM

## 2018-06-24 MED ORDER — FUROSEMIDE 20 MG PO TABS: 40.0000 mg | ORAL_TABLET | Freq: Every day | ORAL | 3 refills | Status: DC

## 2018-06-28 ENCOUNTER — Ambulatory Visit: Payer: Self-pay | Admitting: Surgery

## 2018-06-28 ENCOUNTER — Encounter (HOSPITAL_COMMUNITY): Payer: BLUE CROSS/BLUE SHIELD

## 2018-06-28 DIAGNOSIS — D242 Benign neoplasm of left breast: Secondary | ICD-10-CM | POA: Diagnosis not present

## 2018-06-28 NOTE — H&P (Signed)
Patricia Wagner Documented: 06/28/2018 9:40 AM Location: [facility] Patient #: [id number] DOB: 1951-06-04 Single / Language: Patricia Wagner / Race: Refused to Report/Unreported Female  History of Present Illness Marcello Moores A. Damian Buckles MD; 06/28/2018 10:09 AM) Patient words: Patient's request Dr. Enriqueta Shutter for abnormal left mammogram. Patient went mammogram April 2018. Abnormality left breast noted and biopsy should papilloma. Due to multiple other health issues, no biopsy was pursued beyond that. She returned for a follow-up one year later. Mammogram showed some thickening of the left breast and the site where the papillomas located was stable. She was sent at the request Dr. Milas Hock for consideration of left breast lumpectomy for papilloma. She has multiple medical issues. She walks a cane and does have significant congestive heart failure followed by the heart failure clinic at [facility]. She takes eloquis as well. Patient denies history of breast pain or discharge or change in her breast. She denies any thickening or redness of either breast.               Diagnosis Breast, left, needle core biopsy, 1:00 o'clock - INTRADUCTAL PAPILLOMA. - SEE COMMENT.          Biopsy-proven LEFT breast papilloma in 2018.Patient was not able to have the recommended surgical excision due to a subsequent illness.  EXAM: DIGITAL DIAGNOSTIC BILATERAL MAMMOGRAM WITH CAD AND TOMO  COMPARISON: Previous exam(s).  ACR Breast Density Category c: The breast tissue is heterogeneously dense, which may obscure small masses.  FINDINGS: LEFT breast: Biopsy site marker within the LEFT breast is stable in position, corresponding to the site of biopsy-proven papilloma. The mild diffuse trabecular thickening of the LEFT breast is stable compared to the previous study of 02/20/2017, as is the overlying skin thickening.  RIGHT breast: There are no new dominant masses, suspicious calcifications or  secondary signs of malignancy within the RIGHT breast.  Mammographic images were processed with CAD.  IMPRESSION: 1. Biopsy-proven papilloma within the LEFT breast, located at the 1 o'clock axis per previous ultrasound report. Recommend surgical excision. 2. Mild diffuse trabecular thickening throughout the LEFT breast, with overlying skin thickening, not significantly changed compared to the previous diagnostic mammogram of 02/20/2017, most likely chronic edema related to patient's history of CHF. However, given the persistence, would now consider skin punch biopsy to ensure benignity and exclude the less likely possibility of inflammatory breast cancer.  RECOMMENDATION: 1. Surgical consultation for the biopsy-proven papilloma within the LEFT breast and for consideration of LEFT breast skin punch biopsy, as detailed above. 2. Annual screening mammograms.  Surgical consultation has been scheduled for August 5th at 9:10 a.m. with Dr. Brantley Stage. Patient is aware.  I have discussed the findings and recommendations with the patient. Results and the surgical appointment were also provided in writing at the conclusion of the visit. If applicable, a reminder letter will be sent to the patient regarding the next appointment.  BI-RADS CATEGORY 3: Probably benign. BI-RADS category is based on the skin and trabecular thickening which is suspected edema related to CHF, however, skin punch biopsy is recommended to exclude the less likely possibility of an inflammatory breast cancer.   Electronically Signed By: Franki Cabot M.D. On: 06/11/2018 14:31.  The patient is a 67 year old female.   Past Surgical History (Tanisha A. Owens Shark, [place]; 06/28/2018 9:40 AM) No pertinent past surgical history  Diagnostic Studies History (Tanisha A. Owens Shark, [place]; 06/28/2018 9:40 AM) Colonoscopy 1-5 years ago Mammogram within last year Pap Smear 1-5 years ago  Allergies (Tanisha A. Owens Shark,  RMA;  06/28/2018 9:41 AM) No Known Drug Allergies [06/28/2018]: Allergies Reconciled  Medication History (Tanisha A. Owens Shark, [place]; 06/28/2018 9:43 AM) HydrALAZINE HCl (50MG  Tablet, Oral) Active. Carvedilol (3.125MG  Tablet, Oral) Active. Furosemide (20MG  Tablet, Oral) Active. Ranolazine ER (500MG  Tablet ER 12HR, Oral) Active. Eliquis (5MG  Tablet, Oral) Active. Amiodarone HCl (200MG  Tablet, Oral) Active. Rosuvastatin Calcium (10MG  Tablet, Oral) Active. Isosorbide Mononitrate (60MG  Tablet ER 24HR, Oral) Active. Klor-Con [facility] [place] Packet, Oral) Active. Medications Reconciled  Social History (Tanisha A. Owens Shark, [place]; 06/28/2018 9:40 AM) Alcohol use Remotely quit alcohol use. Caffeine use Coffee. No drug use Tobacco use Former smoker.  Family History (Tanisha A. Owens Shark, [place]; 06/28/2018 9:40 AM) Family history unknown First Degree Relatives  Pregnancy / Birth History (Tanisha A. Owens Shark, [place]; 06/28/2018 9:40 AM) Durenda Age 1 Maternal age 39-25 Para 1  Other Problems (Tanisha A. Owens Shark, [place]; 06/28/2018 9:40 AM) Bladder Problems Chronic Renal Failure Syndrome Congestive Heart Failure     Review of Systems (Enzley Kitchens A. Slater Mcmanaman MD; 06/28/2018 10:09 AM) General Not Present- Appetite Loss, Chills, Fatigue, Fever, Night Sweats, Weight Gain and Weight Loss. Skin Not Present- Change in Wart/Mole, Dryness, Hives, Jaundice, New Lesions, Non-Healing Wounds, Rash and Ulcer. HEENT Present- Wears glasses/contact lenses. Not Present- Earache, Hearing Loss, Hoarseness, Nose Bleed, Oral Ulcers, Ringing in the Ears, Seasonal Allergies, Sinus Pain, Sore Throat, Visual Disturbances and Yellow Eyes. Respiratory Present- Snoring. Not Present- Bloody sputum, Chronic Cough, Difficulty Breathing and Wheezing. Breast Not Present- Breast Mass, Breast Pain, Nipple Discharge and Skin Changes. Cardiovascular Present- Leg Cramps, Shortness of Breath and Swelling of Extremities. Not Present- Chest Pain, Difficulty  Breathing Lying Down, Palpitations and Rapid Heart Rate. Gastrointestinal Not Present- Abdominal Pain, Bloating, Bloody Stool, Change in Bowel Habits, Chronic diarrhea, Constipation, Difficulty Swallowing, Excessive gas, Gets full quickly at meals, Hemorrhoids, Indigestion, Nausea, Rectal Pain and Vomiting. Female Genitourinary Not Present- Frequency, Nocturia, Painful Urination, Pelvic Pain and Urgency. Musculoskeletal Present- Joint Pain and Joint Stiffness. Not Present- Back Pain, Muscle Pain, Muscle Weakness and Swelling of Extremities. Neurological Not Present- Decreased Memory, Fainting, Headaches, Numbness, Seizures, Tingling, Tremor, Trouble walking and Weakness. Psychiatric Not Present- Anxiety, Bipolar, Change in Sleep Pattern, Depression, Fearful and Frequent crying. Endocrine Not Present- Cold Intolerance, Excessive Hunger, Hair Changes, Heat Intolerance, Hot flashes and New Diabetes. Hematology Present- Blood Thinners and Easy Bruising. Not Present- Excessive bleeding, Gland problems, HIV and Persistent Infections. All other systems negative  Vitals (Tanisha A. Brown RMA; 06/28/2018 9:41 AM) 06/28/2018 9:40 AM Weight: 231.4 lb Height: 65in Body Surface Area: 2.1 m Body Mass Index: 38.51 kg/m  Temp.: 97.35F  Pulse: 75 (Regular)  BP: 122/88 (Sitting, Left Arm, Standard)      Physical Exam (Fran Mcree A. Iriel Nason MD; 06/28/2018 10:10 AM)  General Mental Status-Alert. General Appearance-Consistent with stated age. Hydration-Well hydrated. Voice-Normal.  Head and Neck Head-normocephalic, atraumatic with no lesions or palpable masses. Trachea-midline. Thyroid Gland Characteristics - normal size and consistency.  Eye Eyeball - Bilateral-Extraocular movements intact. Sclera/Conjunctiva - Bilateral-No scleral icterus.  Chest and Lung Exam Chest and lung exam reveals -quiet, even and easy respiratory effort with no use of accessory muscles and on  auscultation, normal breath sounds, no adventitious sounds and normal vocal resonance. Inspection Chest Wall - Normal. Back - normal.  Breast Note: Both breasts appear normal. There is no redness or skin thickening on examination or edema. No masses bilaterally.  Cardiovascular Cardiovascular examination reveals -normal heart sounds, regular rate and rhythm with no murmurs and normal pedal pulses bilaterally.  Neurologic Neurologic evaluation  reveals -alert and oriented x 3 with no impairment of recent or remote memory. Mental Status-Normal.  Musculoskeletal Normal Exam - Left-Upper Extremity Strength Normal and Lower Extremity Strength Normal. Normal Exam - Right-Upper Extremity Strength Normal and Lower Extremity Strength Normal.  Lymphatic Head & Neck  General Head & Neck Lymphatics: Bilateral - Description - Normal. Axillary  General Axillary Region: Bilateral - Description - Normal. Tenderness - Non Tender.    Assessment & Plan (Mir Fullilove A. Avrohom Mckelvin MD; 06/28/2018 10:11 AM)  PAPILLOMA OF LEFT BREAST (D24.2) Impression: The patient has no evidence of an inflammatory process on exam.  Recommend left breast lumpectomy for papilloma given 20% potential upgrade risk of these lesions. She has significant congestive heart failure therefore asked for cardiac clearance prior to surgery. He other option would be observation which she has done the last year has been no significant change to her mammogram. She is opted for lumpectomy if feasible. Risk of lumpectomy include bleeding, infection, seroma, more surgery, use of seed/wire, wound care, cosmetic deformity and the need for other treatments, death , blood clots, death. Pt agrees to proceed.  Current Plans Pt Education - [facility] Free Text Education/Instructions: discussed with patient and provided information. Pt Education - [facility] Breast Biopsy HCI: discussed with patient and provided information. The anatomy and the physiology  was discussed. The pathophysiology and natural history of the disease was discussed. Options were discussed and recommendations were made. Technique, risks, benefits, & alternatives were discussed. Risks such as stroke, heart attack, bleeding, indection, death, and other risks discussed. Questions answered. The patient agrees to proceed.

## 2018-07-02 ENCOUNTER — Other Ambulatory Visit: Payer: Self-pay | Admitting: Cardiology

## 2018-07-02 DIAGNOSIS — I38 Endocarditis, valve unspecified: Secondary | ICD-10-CM

## 2018-07-02 DIAGNOSIS — I5022 Chronic systolic (congestive) heart failure: Principal | ICD-10-CM

## 2018-07-02 NOTE — Telephone Encounter (Signed)
Rx(s) sent to pharmacy electronically.  

## 2018-07-05 ENCOUNTER — Encounter (HOSPITAL_COMMUNITY): Payer: BLUE CROSS/BLUE SHIELD

## 2018-07-07 ENCOUNTER — Encounter (HOSPITAL_COMMUNITY)
Admission: RE | Admit: 2018-07-07 | Discharge: 2018-07-07 | Disposition: A | Discharge status: Home / Self Care | Payer: Medicare HMO | Source: Ambulatory Visit | Attending: Nephrology | Admitting: Nephrology

## 2018-07-07 ENCOUNTER — Encounter (HOSPITAL_COMMUNITY): Payer: Self-pay

## 2018-07-07 VITALS — BP 105/60 | HR 64 | Temp 98.8°F | Resp 20 | Ht 63.5 in | Wt 234.1 lb

## 2018-07-07 DIAGNOSIS — N183 Chronic kidney disease, stage 3 (moderate): Secondary | ICD-10-CM | POA: Diagnosis not present

## 2018-07-07 DIAGNOSIS — D631 Anemia in chronic kidney disease: Secondary | ICD-10-CM | POA: Diagnosis not present

## 2018-07-07 DIAGNOSIS — I5022 Chronic systolic (congestive) heart failure: Secondary | ICD-10-CM

## 2018-07-07 NOTE — Progress Notes (Signed)
Patricia Wagner 67 y.o. female  DOB: [birth year] MRN: [id number]           Nutrition Note 1. Chronic systolic CHF (congestive heart failure) (HCC)    Past Medical History:  Diagnosis Date  . Acute blood loss anemia 03/05/2018  . Acute hypercapnic respiratory failure ([place]) 04/30/2017  . Acute renal failure superimposed on chronic kidney disease ([place]) 10/20/2016  . Arthritis   . Atrial fibrillation ([place])    a. s/p multiple cardioversions; failed tikosyn/sotalol.  . Bradycardia 11/20/2016  . CAD in native artery, s/p cardiac cath with non obstructive CAD 10/24/2016  . Cerebrovascular accident (CVA) due to embolism of left cerebellar artery ([place])   . CHF (congestive heart failure) ([place])   . Chronic anticoagulation 03/15/2018  . Chronic atrial fibrillation ([place]) 03/05/2018  . Chronic systolic (congestive) heart failure ([place]) 03/05/2018  . CKD (chronic kidney disease) stage 3, GFR 30-59 ml/min (HCC)   . Coagulopathy ([place]) 03/05/2018  . DIVERTICULITIS, HX OF 07/25/2007  . Diverticulosis of colon with hemorrhage 07/22/2007   Qualifier: Diagnosis of  By: [name]    . DIVERTICULOSIS, COLON 07/22/2007  . Dyspnea   . Dysuria 07/21/2017  . Edema, peripheral    a. chronic BLE edema, R>L. Prior trauma from dog attack and accident.  . Essential hypertension 07/22/2007   Qualifier: Diagnosis of  By: [name]    . HLD (hyperlipidemia) 02/03/2008   Qualifier: Diagnosis of  By: [name], [name]   . HYPERLIPIDEMIA 02/03/2008  . Hypersomnia    declines w/u  . Hypertension   . Hypotension (arterial) 04/30/2017  . MENOPAUSAL DISORDER 01/09/2011  . Morbid obesity ([place]) 07/22/2007  . NICM (nonischemic cardiomyopathy) ([place]) 10/24/2016  . PAF (paroxysmal atrial fibrillation) ([place])   . Raynaud's syndrome 07/22/2007  . Stroke ([place]) 2017  . Suspected sleep apnea 05/20/2017  . THYROID NODULE, RIGHT 01/04/2010  . VITAMIN D DEFICIENCY 01/09/2011   Qualifier: Diagnosis of  By: [name], [name]    Meds reviewed.  Eliquis, lasix, coreg, k-dur, ranexa noted  Ht: Ht Readings from Last 1 Encounters:  07/07/18 5' 3.5" (1.613 m)     Wt:  Wt Readings from Last 3 Encounters:  07/07/18 234 lb 2.1 oz (106.2 kg)  06/21/18 234 lb (106.1 kg)  05/24/18 240 lb (108.9 kg)     BMI: Body mass index is 40.82 kg/m.    Current tobacco use? yes  Labs:  Lipid Panel     Component Value Date/Time   CHOL 94 12/05/2017 1203   TRIG 54 12/05/2017 1203   HDL 47 12/05/2017 1203   CHOLHDL 2.0 12/05/2017 1203   VLDL 11 12/05/2017 1203   LDLCALC 36 12/05/2017 1203   LDLDIRECT 121.6 03/15/2012 1646    Lab Results  Component Value Date   HGBA1C 6.3 (H) 11/21/2016    Nutrition Diagnosis  ? Overweight/obesity related to excessive energy intake as evidenced by a BMI of Body mass index is 40.82 kg/m.    Goal(s)  1. Pt to identify and limit food sources of sodium, saturated fat, trans fats, refined carbohydrates. 2. The pt will recognize symptoms that can interfere with adequate oral intake, such as shortness of breath, N/V, early satiety, fatigue, ability to secure and prepare food, taste and smell changes, chewing/swallowing difficulties, and/ or pain when eating. 3. Identify food quantities necessary to achieve wt loss of  -2# per week to a goal wt loss of 2.7-10.9 kg (6-24 lb) at graduation from pulmonary rehab.  Plan:  Pt to attend Pulmonary Nutrition class Will provide client-centered nutrition education as part of interdisciplinary care.    Monitor and Evaluate progress toward nutrition goal with team.   Laurina Bustle, MS, RD, LDN 07/07/2018 4:02 PM

## 2018-07-07 NOTE — Progress Notes (Addendum)
Patricia Wagner 67 y.o. female Pulmonary Rehab Orientation Note [name], referred to pulmonary rehab by [name] for the diagnosis of systolic CHF, arrived today in Cardiac and Pulmonary Rehab for orientation to Pulmonary Rehab. She was transported from [employer] via wheel chair. She does not carry portable oxygen. Per pt she uses oxygen never. Color good, skin warm and dry. Patient is oriented to time and place. Patient's medical history, psychosocial health, and medications reviewed. Psychosocial assessment reveals pt lives alone. Pt is currently retired. Pt hobbies include watch TV, go out with girlfreinds. Pt reports her stress level is not aplicable. PHQ2/9 score 0/3. Pt shows good  coping skills with positive outlook.Pt feels supported by her daughter who lives here in [place].  Pt has a very laid back attitude toward life.  Pt states at this point in my life I do what I want to do when I want to do it.  I dont let people "worry" me.  Will continue to monitor and evaluate progress toward psychosocial goal(s) of continued mental well being. Physical assessment reveals heart rate is normal, breath sounds clear to auscultation, no wheezes, rales, or rhonchi. Pt reports some "mucous" that she attributes to resuming smoking cigarettes. Grip strength equal, strong. Distal pulses palpable with swelling to the ankles particular on the right leg.  Edema noted to mid shin area. Patient reports she does take medications as prescribed although she can not verbalize them and will bring them in on Tuesday when she returns for her walk test. Patient states she follows a Regular diet and "tries" to abstain from salt. The patient reports no specific efforts to gain or lose weight. Although she would like to lose weight to take the pressure off her knees. Patient's weight will be monitored closely. Demonstration and practice of PLB using pulse oximeter. Patient able to return demonstration satisfactorily. Safety and  hand hygiene in the exercise area reviewed with patient. Patient voices understanding of the information reviewed. Department expectations discussed with patient and achievable goals were set. The patient shows enthusiasm about attending the program and we look forward to working with this nice lady. The patient is scheduled for a 6 min walk test on 8/20 and to begin exercise on 8/27 at 1:30. 45 minutes was spent on a variety of activities such as assessment of the patient, obtaining baseline data including height, weight, BMI, and grip strength, verifying medical history, allergies, and current medications, and teaching patient strategies for performing tasks with less respiratory effort with emphasis on pursed lip breathing. Maurice Small RN, BSN Cardiac and Training and development officer [phone]

## 2018-07-12 ENCOUNTER — Telehealth (HOSPITAL_COMMUNITY): Payer: Self-pay | Admitting: *Deleted

## 2018-07-12 ENCOUNTER — Encounter (HOSPITAL_COMMUNITY): Payer: BLUE CROSS/BLUE SHIELD

## 2018-07-12 ENCOUNTER — Ambulatory Visit (HOSPITAL_COMMUNITY)
Admission: RE | Admit: 2018-07-12 | Discharge: 2018-07-12 | Disposition: A | Discharge status: Home / Self Care | Payer: Medicare HMO | Source: Ambulatory Visit | Attending: Nephrology | Admitting: Nephrology

## 2018-07-12 VITALS — BP 142/78 | HR 62 | Temp 98.7°F | Resp 18

## 2018-07-12 DIAGNOSIS — N183 Chronic kidney disease, stage 3 unspecified: Secondary | ICD-10-CM

## 2018-07-12 DIAGNOSIS — D631 Anemia in chronic kidney disease: Secondary | ICD-10-CM | POA: Diagnosis not present

## 2018-07-12 LAB — IRON AND TIBC
Iron: 16 ug/dL — ABNORMAL LOW (ref 28–170)
Saturation Ratios: 4 % — ABNORMAL LOW (ref 10.4–31.8)
TIBC: 372 ug/dL (ref 250–450)
UIBC: 356 ug/dL

## 2018-07-12 LAB — FERRITIN: Ferritin: 9 ng/mL — ABNORMAL LOW (ref 11–307)

## 2018-07-12 LAB — POCT HEMOGLOBIN-HEMACUE: Hemoglobin: 11.9 g/dL — ABNORMAL LOW (ref 12.0–15.0)

## 2018-07-12 MED ORDER — EPOETIN ALFA 10000 UNIT/ML IJ SOLN
5000.0000 [IU] | INTRAMUSCULAR | Status: DC
  Administered 2018-07-12: 5000 [IU] via SUBCUTANEOUS

## 2018-07-12 MED ORDER — EPOETIN ALFA 10000 UNIT/ML IJ SOLN
INTRAMUSCULAR | Status: AC
  Filled 2018-07-12: qty 1

## 2018-07-12 NOTE — Telephone Encounter (Signed)
Received fax from [facility], pt needs clearance for breast lumpectomy under general anesthesia and hold eliquis.  Per Dr [name]:  " Moderate to High risk for peri-op cv complications but can proceed.  Hold Elquis 48 hours"  Note faxed back to them, atten: Carlene Coria at [phone]

## 2018-07-13 ENCOUNTER — Telehealth (HOSPITAL_COMMUNITY): Payer: Self-pay | Admitting: Internal Medicine

## 2018-07-13 ENCOUNTER — Ambulatory Visit (HOSPITAL_COMMUNITY): Payer: BLUE CROSS/BLUE SHIELD

## 2018-07-14 ENCOUNTER — Encounter (HOSPITAL_COMMUNITY): Payer: Self-pay | Admitting: *Deleted

## 2018-07-14 DIAGNOSIS — N390 Urinary tract infection, site not specified: Secondary | ICD-10-CM | POA: Diagnosis not present

## 2018-07-14 DIAGNOSIS — B9620 Unspecified Escherichia coli [E. coli] as the cause of diseases classified elsewhere: Secondary | ICD-10-CM | POA: Diagnosis not present

## 2018-07-14 DIAGNOSIS — R339 Retention of urine, unspecified: Secondary | ICD-10-CM | POA: Diagnosis not present

## 2018-07-15 ENCOUNTER — Encounter (HOSPITAL_COMMUNITY)
Admission: RE | Admit: 2018-07-15 | Discharge: 2018-07-15 | Disposition: A | Discharge status: Home / Self Care | Payer: Medicare HMO | Source: Ambulatory Visit | Attending: Internal Medicine | Admitting: Internal Medicine

## 2018-07-15 DIAGNOSIS — D631 Anemia in chronic kidney disease: Secondary | ICD-10-CM | POA: Diagnosis not present

## 2018-07-15 DIAGNOSIS — I5022 Chronic systolic (congestive) heart failure: Secondary | ICD-10-CM

## 2018-07-15 DIAGNOSIS — N183 Chronic kidney disease, stage 3 (moderate): Secondary | ICD-10-CM | POA: Diagnosis not present

## 2018-07-19 ENCOUNTER — Encounter (HOSPITAL_COMMUNITY)
Admission: RE | Admit: 2018-07-19 | Discharge: 2018-07-19 | Disposition: A | Discharge status: Home / Self Care | Payer: Medicare HMO | Source: Ambulatory Visit | Attending: Nephrology | Admitting: Nephrology

## 2018-07-19 VITALS — BP 114/69 | HR 64 | Temp 98.4°F | Ht 65.0 in | Wt 231.0 lb

## 2018-07-19 DIAGNOSIS — N183 Chronic kidney disease, stage 3 unspecified: Secondary | ICD-10-CM

## 2018-07-19 DIAGNOSIS — D631 Anemia in chronic kidney disease: Secondary | ICD-10-CM | POA: Diagnosis not present

## 2018-07-19 LAB — POCT HEMOGLOBIN-HEMACUE: Hemoglobin: 11.8 g/dL — ABNORMAL LOW (ref 12.0–15.0)

## 2018-07-19 MED ORDER — EPOETIN ALFA 10000 UNIT/ML IJ SOLN
5000.0000 [IU] | INTRAMUSCULAR | Status: DC
  Administered 2018-07-19: 5000 [IU] via SUBCUTANEOUS

## 2018-07-19 MED ORDER — EPOETIN ALFA 10000 UNIT/ML IJ SOLN
INTRAMUSCULAR | Status: AC
  Administered 2018-07-19: 5000 [IU] via SUBCUTANEOUS
  Filled 2018-07-19: qty 1

## 2018-07-19 NOTE — Progress Notes (Signed)
Pulmonary Individual Treatment Plan  Patient Details  Name: JALECIA LEON MRN: [id number] Date of Birth: January 10, 1951 Referring Provider:     Pulmonary Rehab Walk Test from 07/15/2018 in [facility]  Referring Provider  [name]      Initial Encounter Date:    Pulmonary Rehab Walk Test from 07/15/2018 in [facility]  Date  07/19/18      Visit Diagnosis: Chronic systolic CHF (congestive heart failure) ([place])  Patient's Home Medications on Admission:   Current Outpatient Medications:  .  amiodarone (PACERONE) 200 MG tablet, Take 1 tablet (200 mg total) by mouth daily., Disp: 30 tablet, Rfl: 0 .  carvedilol (COREG) 3.125 MG tablet, TAKE 1 TABLET (3.125 MG TOTAL) BY MOUTH 2 (TWO) TIMES DAILY., Disp: 180 tablet, Rfl: 1 .  epoetin alfa (EPOGEN,PROCRIT) 22979 UNIT/ML injection, Inject 5,000 Units into the skin once a week., Disp: , Rfl:  .  furosemide (LASIX) 20 MG tablet, Take 2 tablets (40 mg total) by mouth daily., Disp: 90 tablet, Rfl: 3 .  hydrALAZINE (APRESOLINE) 50 MG tablet, Take 1 tablet (50 mg total) by mouth 3 (three) times daily., Disp: 90 tablet, Rfl: 11 .  isosorbide mononitrate (IMDUR) 60 MG 24 hr tablet, Take 1 tablet (60 mg total) by mouth daily., Disp: 30 tablet, Rfl: 11 .  potassium chloride SA (K-DUR,KLOR-CON) 20 MEQ tablet, Take 1 tablet (20 mEq total) by mouth daily. Take 4 tabs first day, then take 2 tabs daily, Disp: 34 tablet, Rfl: 6 .  ranolazine (RANEXA) 500 MG 12 hr tablet, TAKE 1 TABLET BY MOUTH TWICE A DAY, Disp: 60 tablet, Rfl: 5 .  rosuvastatin (CRESTOR) 10 MG tablet, Take 10 mg by mouth daily., Disp: , Rfl:  .  apixaban (ELIQUIS) 5 MG TABS tablet, Take 1 tablet (5 mg total) by mouth 2 (two) times daily., Disp: 60 tablet, Rfl: 11 .  Investigational - Study Medication, Take 1 tablet by mouth 2 (two) times daily. Study name: Galactic HF Study  Additional study details: Omecamtiv Mecarbil or Placebo,  Disp: 1 each, Rfl: PRN .  metolazone (ZAROXOLYN) 2.5 MG tablet, Take 1 tablet (2.5 mg total) by mouth as needed (swelling/ wt gain). Take 1 Tablet as directed by the [facility] (Patient not taking: Reported on 07/15/2018), Disp: 5 tablet, Rfl: 3 No current facility-administered medications for this encounter.   Facility-Administered Medications Ordered in Other Encounters:  .  epoetin alfa (EPOGEN,PROCRIT) injection 5,000 Units, 5,000 Units, Subcutaneous, Weekly, Edrick Oh, MD, 5,000 Units at 07/19/18 1246  Past Medical History: Past Medical History:  Diagnosis Date  . Acute blood loss anemia 03/05/2018  . Acute hypercapnic respiratory failure ([place]) 04/30/2017  . Acute renal failure superimposed on chronic kidney disease ([place]) 10/20/2016  . Arthritis   . Atrial fibrillation ([place])    a. s/p multiple cardioversions; failed tikosyn/sotalol.  . Bradycardia 11/20/2016  . CAD in native artery, s/p cardiac cath with non obstructive CAD 10/24/2016  . Cerebrovascular accident (CVA) due to embolism of left cerebellar artery ([place])   . CHF (congestive heart failure) ([place])   . Chronic anticoagulation 03/15/2018  . Chronic atrial fibrillation ([place]) 03/05/2018  . Chronic systolic (congestive) heart failure ([place]) 03/05/2018  . CKD (chronic kidney disease) stage 3, GFR 30-59 ml/min (H[facility])   . Coagulopathy ([place]) 03/05/2018  . DIVERTICULITIS, HX OF 07/25/2007  . Diverticulosis of colon with hemorrhage 07/22/2007   Qualifier: Diagnosis of  By: [name]    .  DIVERTICULOSIS, COLON 07/22/2007  . Dyspnea   . Dysuria 07/21/2017  . Edema, peripheral    a. chronic BLE edema, R>L. Prior trauma from dog attack and accident.  . Essential hypertension 07/22/2007   Qualifier: Diagnosis of  By: [name]    . HLD (hyperlipidemia) 02/03/2008   Qualifier: Diagnosis of  By: [name], [name]   . HYPERLIPIDEMIA 02/03/2008  . Hypersomnia    declines w/u  . Hypertension   . Hypotension (arterial) 04/30/2017  .  MENOPAUSAL DISORDER 01/09/2011  . Morbid obesity ([place]) 07/22/2007  . NICM (nonischemic cardiomyopathy) ([place]) 10/24/2016  . PAF (paroxysmal atrial fibrillation) ([place])   . Raynaud's syndrome 07/22/2007  . Stroke ([place]) 2017  . Suspected sleep apnea 05/20/2017  . THYROID NODULE, RIGHT 01/04/2010  . VITAMIN D DEFICIENCY 01/09/2011   Qualifier: Diagnosis of  By: [name], [name]     Tobacco Use: Social History   Tobacco Use  Smoking Status Former Smoker  . Packs/day: 0.50  . Types: Cigarettes  . Start date: 2019  Smokeless Tobacco Never Used  Tobacco Comment   restarted back this year    Labs: Recent Review Flowsheet Data    Labs for ITP Cardiac and Pulmonary Rehab Latest Ref Rng & Units 05/15/2017 05/16/2017 07/21/2017 11/30/2017 12/05/2017   Cholestrol 0 - 200 mg/dL - - 165 - 94   LDLCALC 0 - 99 mg/dL - - 81 - 36   LDLDIRECT mg/dL - - - - -   HDL >40 mg/dL - - 66.10 - 47   Trlycerides <150 mg/dL - - 88.0 - 54   Hemoglobin A1c 4.8 - 5.6 % - - - - -   PHART 7.350 - 7.450 - - - - -   PCO2ART 32.0 - 48.0 mmHg - - - - -   HCO3 20.0 - 28.0 mmol/L - - - - -   TCO2 22 - 32 mmol/L - - - 16(L) -   ACIDBASEDEF 0.0 - 2.0 mmol/L - - - - -   O2SAT % 78.3 73.2 - - -      Capillary Blood Glucose: Lab Results  Component Value Date   GLUCAP 77 04/30/2017   GLUCAP 72 04/29/2017   GLUCAP 116 (H) 04/29/2017   GLUCAP 141 (H) 11/20/2016     Pulmonary Assessment Scores: Pulmonary Assessment Scores    Row Name 07/14/18 1513 07/19/18 1420 07/19/18 1439     ADL UCSD   ADL Phase  -  -  Entry   SOB Score total  27  -  -     CAT Score   CAT Score  28  -  -     mMRC Score   mMRC Score  -  1  1      Pulmonary Function Assessment:   Exercise Target Goals: Exercise Program Goal: Individual exercise prescription set using results from initial 6 min walk test and THRR while considering  patient's activity barriers and safety.   Exercise Prescription Goal: Initial exercise prescription builds  to 30-45 minutes a day of aerobic activity, 2-3 days per week.  Home exercise guidelines will be given to patient during program as part of exercise prescription that the participant will acknowledge.  Activity Barriers & Risk Stratification: Activity Barriers & Cardiac Risk Stratification - 07/07/18 1430      Activity Barriers & Cardiac Risk Stratification   Activity Barriers  Assistive Device;Shortness of Breath;Joint Problems;Back Problems   bilat knee ( need surgery)   Cardiac  Risk Stratification  High       6 Minute Walk: 6 Minute Walk    Row Name 07/19/18 1439         6 Minute Walk   Phase  Initial     Distance  500 feet     Walk Time  6 minutes     # of Rest Breaks  0     MPH  0.94     METS  1.77     RPE  12     Perceived Dyspnea   1     Symptoms  Yes (comment)     Comments  Used wheelchair, 6/10 both knee pain, 3 cigarettes today     Resting HR  64 bpm     Resting BP  108/70     Resting Oxygen Saturation   90 %     Exercise Oxygen Saturation  during 6 min walk  88 %     Max Ex. HR  83 bpm     Max Ex. BP  138/80       Interval HR   1 Minute HR  77     2 Minute HR  82     3 Minute HR  82     4 Minute HR  83     5 Minute HR  83     6 Minute HR  81     2 Minute Post HR  72     Interval Heart Rate?  Yes       Interval Oxygen   Interval Oxygen?  Yes     Baseline Oxygen Saturation %  90 %     1 Minute Oxygen Saturation %  90 %     1 Minute Liters of Oxygen  0 L     2 Minute Oxygen Saturation %  88 %     2 Minute Liters of Oxygen  0 L     3 Minute Oxygen Saturation %  88 %     3 Minute Liters of Oxygen  0 L     4 Minute Oxygen Saturation %  89 %     4 Minute Liters of Oxygen  0 L     5 Minute Oxygen Saturation %  89 %     5 Minute Liters of Oxygen  0 L     6 Minute Oxygen Saturation %  88 %     6 Minute Liters of Oxygen  0 L     2 Minute Post Oxygen Saturation %  87 %     2 Minute Post Liters of Oxygen  9 L        Oxygen Initial Assessment: Oxygen  Initial Assessment - 07/19/18 1438      Initial 6 min Walk   Oxygen Used  None      Program Oxygen Prescription   Program Oxygen Prescription  None       Oxygen Re-Evaluation:   Oxygen Discharge (Final Oxygen Re-Evaluation):   Initial Exercise Prescription: Initial Exercise Prescription - 07/19/18 1400      Date of Initial Exercise RX and Referring Provider   Date  07/19/18    Referring Provider  [name]      NuStep   Level  2    SPM  80    Minutes  17    METs  1.5      Prescription Details   Frequency (times per week)  2  Duration  Progress to 45 minutes of aerobic exercise without signs/symptoms of physical distress      Intensity   THRR 40-80% of Max Heartrate  62-125    Ratings of Perceived Exertion  11-13    Perceived Dyspnea  0-4      Progression   Progression  Continue progressive overload as per policy without signs/symptoms or physical distress.      Resistance Training   Training Prescription  Yes    Weight  ORANGE BANDS    Reps  10-15       Perform Capillary Blood Glucose checks as needed.  Exercise Prescription Changes:   Exercise Comments:   Exercise Goals and Review: Exercise Goals    Row Name 07/07/18 1431             Exercise Goals   Increase Physical Activity  Yes       Intervention  Provide advice, education, support and counseling about physical activity/exercise needs.;Develop an individualized exercise prescription for aerobic and resistive training based on initial evaluation findings, risk stratification, comorbidities and participant's personal goals.       Expected Outcomes  Short Term: Attend rehab on a regular basis to increase amount of physical activity.;Long Term: Add in home exercise to make exercise part of routine and to increase amount of physical activity.;Long Term: Exercising regularly at least 3-5 days a week.       Increase Strength and Stamina  Yes       Intervention  Provide advice, education,  support and counseling about physical activity/exercise needs.;Develop an individualized exercise prescription for aerobic and resistive training based on initial evaluation findings, risk stratification, comorbidities and participant's personal goals.       Expected Outcomes  Short Term: Increase workloads from initial exercise prescription for resistance, speed, and METs.;Short Term: Perform resistance training exercises routinely during rehab and add in resistance training at home;Long Term: Improve cardiorespiratory fitness, muscular endurance and strength as measured by increased METs and functional capacity (6MWT)       Able to understand and use rate of perceived exertion (RPE) scale  Yes       Intervention  Provide education and explanation on how to use RPE scale       Expected Outcomes  Short Term: Able to use RPE daily in rehab to express subjective intensity level;Long Term:  Able to use RPE to guide intensity level when exercising independently       Able to understand and use Dyspnea scale  Yes       Intervention  Provide education and explanation on how to use Dyspnea scale       Expected Outcomes  Short Term: Able to use Dyspnea scale daily in rehab to express subjective sense of shortness of breath during exertion;Long Term: Able to use Dyspnea scale to guide intensity level when exercising independently       Knowledge and understanding of Target Heart Rate Range (THRR)  Yes       Intervention  Provide education and explanation of THRR including how the numbers were predicted and where they are located for reference       Expected Outcomes  Short Term: Able to state/look up THRR;Short Term: Able to use daily as guideline for intensity in rehab;Long Term: Able to use THRR to govern intensity when exercising independently       Understanding of Exercise Prescription  Yes       Intervention  Provide education, explanation, and written materials  on patient's individual exercise prescription        Expected Outcomes  Short Term: Able to explain program exercise prescription;Long Term: Able to explain home exercise prescription to exercise independently          Exercise Goals Re-Evaluation :   Discharge Exercise Prescription (Final Exercise Prescription Changes):   Nutrition:  Target Goals: Understanding of nutrition guidelines, daily intake of sodium 1500mg , cholesterol 200mg , calories 30% from fat and 7% or less from saturated fats, daily to have 5 or more servings of fruits and vegetables.  Biometrics:    Nutrition Therapy Plan and Nutrition Goals: Nutrition Therapy & Goals - 07/07/18 1603      Nutrition Therapy   Diet  consisitent carbohydrate heart healthy      Personal Nutrition Goals   Nutrition Goal  Pt to identify and limit food sources of sodium, saturated fat, trans fats, refined carbohydrates.    Personal Goal #2  Identify food quantities necessary to achieve wt loss of  -2# per week to a goal wt loss of 2.7-10.9 kg (6-24 lb) at graduation from pulmonary rehab.    Personal Goal #3  The pt will recognize symptoms that can interfere with adequate oral intake      Intervention Plan   Intervention  Prescribe, educate and counsel regarding individualized specific dietary modifications aiming towards targeted core components such as weight, hypertension, lipid management, diabetes, heart failure and other comorbidities.    Expected Outcomes  Short Term Goal: Understand basic principles of dietary content, such as calories, fat, sodium, cholesterol and nutrients.       Nutrition Assessments: Nutrition Assessments - 07/07/18 1604      Rate Your Plate Scores   Pre Score  46       Nutrition Goals Re-Evaluation:   Nutrition Goals Discharge (Final Nutrition Goals Re-Evaluation):   Psychosocial: Target Goals: Acknowledge presence or absence of significant depression and/or stress, maximize coping skills, provide positive support system. Participant is  able to verbalize types and ability to use techniques and skills needed for reducing stress and depression.  Initial Review & Psychosocial Screening: Initial Psych Review & Screening - 07/07/18 1433      Initial Review   Current issues with  None Identified      Family Dynamics   Good Support System?  Yes    Comments  Daughter lives in [place], grandkids      Barriers   Psychosocial barriers to participate in program  There are no identifiable barriers or psychosocial needs.      Screening Interventions   Interventions  Encouraged to exercise    Expected Outcomes  Short Term goal: Identification and review with participant of any Quality of Life or Depression concerns found by scoring the questionnaire.;Long Term goal: The participant improves quality of Life and PHQ9 Scores as seen by post scores and/or verbalization of changes       Quality of Life Scores:  Scores of 19 and below usually indicate a poorer quality of life in these areas.  A difference of  2-3 points is a clinically meaningful difference.  A difference of 2-3 points in the total score of the Quality of Life Index has been associated with significant improvement in overall quality of life, self-image, physical symptoms, and general health in studies assessing change in quality of life.  PHQ-9: Recent Review Flowsheet Data    Depression screen [facility] 2/9 07/07/2018 12/20/2017   Decreased Interest 0 0   Down, Depressed, Hopeless 0 1  PHQ - 2 Score 0 1   Altered sleeping 1 -   Tired, decreased energy 0 -   Change in appetite 1 -   Feeling bad or failure about yourself  0 -   Trouble concentrating 0 -   Moving slowly or fidgety/restless 1 -   Suicidal thoughts 0 -   PHQ-9 Score 3 -   Difficult doing work/chores Not difficult at all -     Interpretation of Total Score  Total Score Depression Severity:  1-4 = Minimal depression, 5-9 = Mild depression, 10-14 = Moderate depression, 15-19 = Moderately severe  depression, 20-27 = Severe depression   Psychosocial Evaluation and Intervention: Psychosocial Evaluation - 07/07/18 1435      Psychosocial Evaluation & Interventions   Interventions  Stress management education;Relaxation education;Encouraged to exercise with the program and follow exercise prescription    Comments  no identifible barriers    Expected Outcomes  Continue to display mental health well being    Continue Psychosocial Services   Follow up required by staff       Psychosocial Re-Evaluation:   Psychosocial Discharge (Final Psychosocial Re-Evaluation):   Education: Education Goals: Education classes will be provided on a weekly basis, covering required topics. Participant will state understanding/return demonstration of topics presented.  Learning Barriers/Preferences: Learning Barriers/Preferences - 07/07/18 1435      Learning Barriers/Preferences   Learning Barriers  Sight    Learning Preferences  Group Instruction;Individual Instruction;Video;Verbal Instruction       Education Topics: Risk Factor Reduction:  -Group instruction that is supported by a PowerPoint presentation. Instructor discusses the definition of a risk factor, different risk factors for pulmonary disease, and how the heart and lungs work together.     Nutrition for Pulmonary Patient:  -Group instruction provided by PowerPoint slides, verbal discussion, and written materials to support subject matter. The instructor gives an explanation and review of healthy diet recommendations, which includes a discussion on weight management, recommendations for fruit and vegetable consumption, as well as protein, fluid, caffeine, fiber, sodium, sugar, and alcohol. Tips for eating when patients are short of breath are discussed.   Pursed Lip Breathing:  -Group instruction that is supported by demonstration and informational handouts. Instructor discusses the benefits of pursed lip and diaphragmatic breathing  and detailed demonstration on how to preform both.     Oxygen Safety:  -Group instruction provided by PowerPoint, verbal discussion, and written material to support subject matter. There is an overview of "What is Oxygen" and "Why do we need it".  Instructor also reviews how to create a safe environment for oxygen use, the importance of using oxygen as prescribed, and the risks of noncompliance. There is a brief discussion on traveling with oxygen and resources the patient may utilize.   Oxygen Equipment:  -Group instruction provided by [facility] Staff utilizing handouts, written materials, and equipment demonstrations.   Signs and Symptoms:  -Group instruction provided by written material and verbal discussion to support subject matter. Warning signs and symptoms of infection, stroke, and heart attack are reviewed and when to call the physician/911 reinforced. Tips for preventing the spread of infection discussed.   Advanced Directives:  -Group instruction provided by verbal instruction and written material to support subject matter. Instructor reviews Advanced Directive laws and proper instruction for filling out document.   Pulmonary Video:  -Group video education that reviews the importance of medication and oxygen compliance, exercise, good nutrition, pulmonary hygiene, and pursed lip and diaphragmatic breathing for the pulmonary  patient.   Exercise for the Pulmonary Patient:  -Group instruction that is supported by a PowerPoint presentation. Instructor discusses benefits of exercise, core components of exercise, frequency, duration, and intensity of an exercise routine, importance of utilizing pulse oximetry during exercise, safety while exercising, and options of places to exercise outside of rehab.     Pulmonary Medications:  -Verbally interactive group education provided by instructor with focus on inhaled medications and proper administration.   Anatomy and Physiology of  the Respiratory System and Intimacy:  -Group instruction provided by PowerPoint, verbal discussion, and written material to support subject matter. Instructor reviews respiratory cycle and anatomical components of the respiratory system and their functions. Instructor also reviews differences in obstructive and restrictive respiratory diseases with examples of each. Intimacy, Sex, and Sexuality differences are reviewed with a discussion on how relationships can change when diagnosed with pulmonary disease. Common sexual concerns are reviewed.   MD DAY -A group question and answer session with a medical doctor that allows participants to ask questions that relate to their pulmonary disease state.   OTHER EDUCATION -Group or individual verbal, written, or video instructions that support the educational goals of the pulmonary rehab program.   Holiday Eating Survival Tips:  -Group instruction provided by PowerPoint slides, verbal discussion, and written materials to support subject matter. The instructor gives patients tips, tricks, and techniques to help them not only survive but enjoy the holidays despite the onslaught of food that accompanies the holidays.   Knowledge Questionnaire Score:   Core Components/Risk Factors/Patient Goals at Admission: Personal Goals and Risk Factors at Admission - 07/07/18 1436      Core Components/Risk Factors/Patient Goals on Admission    Weight Management  Yes;Weight Loss    Intervention  Weight Management: Develop a combined nutrition and exercise program designed to reach desired caloric intake, while maintaining appropriate intake of nutrient and fiber, sodium and fats, and appropriate energy expenditure required for the weight goal.;Weight Management: Provide education and appropriate resources to help participant work on and attain dietary goals.;Weight Management/Obesity: Establish reasonable short term and long term weight goals.;Obesity: Provide  education and appropriate resources to help participant work on and attain dietary goals.    Admit Weight  234 lb 2.1 oz (106.2 kg)    Goal Weight: Short Term  230 lb (104.3 kg)    Goal Weight: Long Term  225 lb (102.1 kg)    Expected Outcomes  Short Term: Continue to assess and modify interventions until short term weight is achieved;Long Term: Adherence to nutrition and physical activity/exercise program aimed toward attainment of established weight goal;Weight Loss: Understanding of general recommendations for a balanced deficit meal plan, which promotes 1-2 lb weight loss per week and includes a negative energy balance of [phone] kcal/d;Understanding recommendations for meals to include 15-35% energy as protein, 25-35% energy from fat, 35-60% energy from carbohydrates, less than 200mg  of dietary cholesterol, 20-35 gm of total fiber daily;Understanding of distribution of calorie intake throughout the day with the consumption of 4-5 meals/snacks    Tobacco Cessation  Yes    Number of packs per day  1/ 2 pack day    Intervention  Assist the participant in steps to quit. Provide individualized education and counseling about committing to Tobacco Cessation, relapse prevention, and pharmacological support that can be provided by physician.;Advice worker, assist with locating and accessing local/national Quit Smoking programs, and support quit date choice.    Expected Outcomes  Short Term: Will demonstrate readiness to quit,  by selecting a quit date.;Short Term: Will quit all tobacco product use, adhering to prevention of relapse plan.;Long Term: Complete abstinence from all tobacco products for at least 12 months from quit date.    Improve shortness of breath with ADL's  Yes    Intervention  Provide education, individualized exercise plan and daily activity instruction to help decrease symptoms of SOB with activities of daily living.    Expected Outcomes  Short Term: Improve  cardiorespiratory fitness to achieve a reduction of symptoms when performing ADLs;Long Term: Be able to perform more ADLs without symptoms or delay the onset of symptoms    Heart Failure  Yes    Intervention  Provide a combined exercise and nutrition program that is supplemented with education, support and counseling about heart failure. Directed toward relieving symptoms such as shortness of breath, decreased exercise tolerance, and extremity edema.    Expected Outcomes  Improve functional capacity of life;Short term: Attendance in program 2-3 days a week with increased exercise capacity. Reported lower sodium intake. Reported increased fruit and vegetable intake. Reports medication compliance.;Short term: Daily weights obtained and reported for increase. Utilizing diuretic protocols set by physician.;Long term: Adoption of self-care skills and reduction of barriers for early signs and symptoms recognition and intervention leading to self-care maintenance.       Core Components/Risk Factors/Patient Goals Review:    Core Components/Risk Factors/Patient Goals at Discharge (Final Review):    ITP Comments: ITP Comments    Row Name 07/07/18 1430           ITP Comments  Dr. Manfred Arch, Medical Director          Comments:

## 2018-07-20 ENCOUNTER — Encounter (HOSPITAL_COMMUNITY): Payer: Medicare HMO

## 2018-07-21 ENCOUNTER — Encounter: Payer: Self-pay | Admitting: Cardiology

## 2018-07-21 ENCOUNTER — Ambulatory Visit (INDEPENDENT_AMBULATORY_CARE_PROVIDER_SITE_OTHER): Payer: Medicare HMO | Admitting: Cardiology

## 2018-07-21 VITALS — BP 120/72 | HR 67 | Ht 65.0 in | Wt 235.0 lb

## 2018-07-21 DIAGNOSIS — I481 Persistent atrial fibrillation: Secondary | ICD-10-CM | POA: Diagnosis not present

## 2018-07-21 DIAGNOSIS — I428 Other cardiomyopathies: Secondary | ICD-10-CM

## 2018-07-21 DIAGNOSIS — I4819 Other persistent atrial fibrillation: Secondary | ICD-10-CM

## 2018-07-21 NOTE — Progress Notes (Signed)
Electrophysiology Office Note   Date:  07/21/2018   ID:  Patricia, Wagner October 07, 1951, MRN [id number]  PCP:  Patricia Borg, MD  Cardiologist:  Patricia Wagner Primary Electrophysiologist:  Patricia Culbertson Meredith Leeds, MD    No chief complaint on file.    History of Present Illness: Patricia Wagner is a 67 y.o. female who is being seen today for the evaluation of CHF, atrial fibrillation at the request of Patricia Borg, MD. Presenting today for electrophysiology evaluation. She has a history of persistent atrial fibrillation, nonischemic cardiomyopathy, CHF, hypertension, and hyperlipidemia. She has had atrial fibrillation since December 2017. She is had multiple cardioversions and has been loaded on amiodarone. She was admitted to the hospital in June 2018 in cardiogenic shock in the setting of persistent atrial fibrillation. EF was found to be 15% with severe bi-V dysfunction. She was started on amiodarone. She was successfully cardioverted to sinus rhythm after initiation of IV amiodarone.Since that time she has done well without recurrence of atrial fibrillation.  Today, denies symptoms of palpitations, chest pain, shortness of breath, orthopnea, PND, lower extremity edema, claudication, dizziness, presyncope, syncope, bleeding, or neurologic sequela. The patient is tolerating medications without difficulties.  Overall she is doing well.  She has not noted any further episodes of atrial fibrillation.  She feels that she is tolerating the amiodarone without issue.  Of note she does need a biopsy of a mass in her left breast.   Past Medical History:  Diagnosis Date  . Acute blood loss anemia 03/05/2018  . Acute hypercapnic respiratory failure ([place]) 04/30/2017  . Acute renal failure superimposed on chronic kidney disease ([place]) 10/20/2016  . Arthritis   . Atrial fibrillation ([place])    a. s/p multiple cardioversions; failed tikosyn/sotalol.  . Bradycardia 11/20/2016  . CAD in native artery, s/p cardiac cath  with non obstructive CAD 10/24/2016  . Cerebrovascular accident (CVA) due to embolism of left cerebellar artery ([place])   . CHF (congestive heart failure) ([place])   . Chronic anticoagulation 03/15/2018  . Chronic atrial fibrillation ([place]) 03/05/2018  . Chronic systolic (congestive) heart failure ([place]) 03/05/2018  . CKD (chronic kidney disease) stage 3, GFR 30-59 ml/min (HCC)   . Coagulopathy ([place]) 03/05/2018  . DIVERTICULITIS, HX OF 07/25/2007  . Diverticulosis of colon with hemorrhage 07/22/2007   Qualifier: Diagnosis of  By: [name]    . DIVERTICULOSIS, COLON 07/22/2007  . Dyspnea   . Dysuria 07/21/2017  . Edema, peripheral    a. chronic BLE edema, R>L. Prior trauma from dog attack and accident.  . Essential hypertension 07/22/2007   Qualifier: Diagnosis of  By: [name]    . HLD (hyperlipidemia) 02/03/2008   Qualifier: Diagnosis of  By: [name], [name]   . HYPERLIPIDEMIA 02/03/2008  . Hypersomnia    declines w/u  . Hypertension   . Hypotension (arterial) 04/30/2017  . MENOPAUSAL DISORDER 01/09/2011  . Morbid obesity ([place]) 07/22/2007  . NICM (nonischemic cardiomyopathy) ([place]) 10/24/2016  . PAF (paroxysmal atrial fibrillation) ([place])   . Raynaud's syndrome 07/22/2007  . Stroke ([place]) 2017  . Suspected sleep apnea 05/20/2017  . THYROID NODULE, RIGHT 01/04/2010  . VITAMIN D DEFICIENCY 01/09/2011   Qualifier: Diagnosis of  By: [name], [name]    Past Surgical History:  Procedure Laterality Date  . CARDIAC CATHETERIZATION N/A 10/23/2016   Procedure: Left Heart Cath and Coronary Angiography;  Surgeon: [name], MD;  Location: [facility];  Service: Cardiovascular;  Laterality:  N/A;  . CARDIOVERSION N/A 10/31/2016   Procedure: CARDIOVERSION;  Surgeon: [name], MD;  Location: [place];  Service: Cardiovascular;  Laterality: N/A;  . CARDIOVERSION N/A 11/03/2016   Procedure: CARDIOVERSION;  Surgeon: [name], MD;  Location: [place];  Service:  Cardiovascular;  Laterality: N/A;  . CARDIOVERSION N/A 11/18/2016   Procedure: CARDIOVERSION;  Surgeon: [name], MD;  Location: [place];  Service: Cardiovascular;  Laterality: N/A;  . CARDIOVERSION N/A 03/25/2017   Procedure: CARDIOVERSION;  Surgeon: [name], MD;  Location: [facility] ENDOSCOPY;  Service: Cardiovascular;  Laterality: N/A;  . CARDIOVERSION N/A 05/06/2017   Procedure: CARDIOVERSION;  Surgeon: [name], MD;  [facility];  Service: Cardiovascular;  Laterality: N/A;  . CARDIOVERSION N/A 05/12/2017   Procedure: CARDIOVERSION;  Surgeon: [name], MD;  Location: [facility] ENDOSCOPY;  Service: Cardiovascular;  Laterality: N/A;  . COLONOSCOPY N/A 12/04/2017   Procedure: COLONOSCOPY;  Surgeon: [name], MD;  Location: [place];  Service: Endoscopy;  Laterality: N/A;  . COLONOSCOPY W/ POLYPECTOMY  02/2011   pan diverticulosis.  tubular adenoma without dysplasia on 5 mm sigmoid polyp.  Dr Fuller Plan.    Marland Kitchen PARTIAL HYSTERECTOMY     1 OVARY LEFT  . TEE WITHOUT CARDIOVERSION N/A 10/31/2016   Procedure: TRANSESOPHAGEAL ECHOCARDIOGRAM (TEE);  Surgeon: [name], MD;  Location: [facility] ENDOSCOPY;  Service: Cardiovascular;  Laterality: N/A;  . TEE WITHOUT CARDIOVERSION N/A 03/25/2017   Procedure: TRANSESOPHAGEAL ECHOCARDIOGRAM (TEE);  Surgeon: [name], MD;  Location: [facility] ENDOSCOPY;  Service: Cardiovascular;  Laterality: N/A;     Current Outpatient Medications  Medication Sig Dispense Refill  . amiodarone (PACERONE) 200 MG tablet Take 1 tablet (200 mg total) by mouth daily. 30 tablet 0  . apixaban (ELIQUIS) 5 MG TABS tablet Take 1 tablet (5 mg total) by mouth 2 (two) times daily. 60 tablet 11  . carvedilol (COREG) 3.125 MG tablet TAKE 1 TABLET (3.125 MG TOTAL) BY MOUTH 2 (TWO) TIMES DAILY. 180 tablet 1  . epoetin alfa (EPOGEN,PROCRIT) 37858 UNIT/ML injection Inject 5,000 Units into the skin once a week.    . furosemide (LASIX) 20 MG tablet Take 2  tablets (40 mg total) by mouth daily. 90 tablet 3  . Investigational - Study Medication Take 1 tablet by mouth 2 (two) times daily. Study name: Galactic HF Study  Additional study details: Omecamtiv Mecarbil or Placebo 1 each PRN  . metolazone (ZAROXOLYN) 2.5 MG tablet Take 1 tablet (2.5 mg total) by mouth as needed (swelling/ wt gain). Take 1 Tablet as directed by the [facility]F Clinic 5 tablet 3  . potassium chloride SA (K-DUR,KLOR-CON) 20 MEQ tablet Take 1 tablet (20 mEq total) by mouth daily. Take 4 tabs first day, then take 2 tabs daily 34 tablet 6  . ranolazine (RANEXA) 500 MG 12 hr tablet TAKE 1 TABLET BY MOUTH TWICE A DAY 60 tablet 5  . rosuvastatin (CRESTOR) 10 MG tablet Take 10 mg by mouth daily.    . hydrALAZINE (APRESOLINE) 50 MG tablet Take 1 tablet (50 mg total) by mouth 3 (three) times daily. 90 tablet 11  . isosorbide mononitrate (IMDUR) 60 MG 24 hr tablet Take 1 tablet (60 mg total) by mouth daily. 30 tablet 11   No current facility-administered medications for this visit.     Allergies:   Ace inhibitors   Social History:  The patient  reports that she has quit smoking. Her smoking use included cigarettes. She started smoking  about 7 months ago. She smoked 0.50 packs per day. She has never used smokeless tobacco. She reports that she does not drink alcohol or use drugs.   Family History:  The patient's family history includes Asthma in her mother; Diabetes in her father; Heart disease in her father and sister; Lung disease in her sister.    ROS:  Please see the history of present illness.   Otherwise, review of systems is positive for swelling, joint swelling.   All other systems are reviewed and negative.   PHYSICAL EXAM: VS:  There were no vitals taken for this visit. , BMI There is no height or weight on file to calculate BMI. GEN: Well nourished, well developed, in no acute distress  HEENT: normal  Neck: no JVD, carotid bruits, or masses Cardiac: RRR; no murmurs, rubs, or  gallops,no edema  Respiratory:  clear to auscultation bilaterally, normal work of breathing GI: soft, nontender, nondistended, + BS MS: no deformity or atrophy  Skin: warm and dry Neuro:  Strength and sensation are intact Psych: euthymic mood, full affect  EKG:  EKG is ordered today. Personal review of the ekg ordered shows sinus rhythm, left atrial enlargement, right axis deviation, rate 65  Recent Labs: 03/10/2018: Magnesium 1.7 03/17/2018: ALT 10 04/13/2018: TSH 0.524 04/15/2018: Platelets 268 06/14/2018: BUN 30; Creatinine, Ser 2.37; Potassium 4.8; Sodium 142 07/19/2018: Hemoglobin 11.8    Lipid Panel     Component Value Date/Time   CHOL 94 12/05/2017 1203   TRIG 54 12/05/2017 1203   HDL 47 12/05/2017 1203   CHOLHDL 2.0 12/05/2017 1203   VLDL 11 12/05/2017 1203   LDLCALC 36 12/05/2017 1203   LDLDIRECT 121.6 03/15/2012 1646     Wt Readings from Last 3 Encounters:  07/19/18 231 lb (104.8 kg)  07/07/18 234 lb 2.1 oz (106.2 kg)  06/21/18 234 lb (106.1 kg)      Other studies Reviewed: Additional studies/ records that were reviewed today include: TTE 03/06/18  Review of the above records today demonstrates:  - Left ventricle: The cavity size was normal. There was severe   concentric hypertrophy. Systolic function was mildly to   moderately reduced. The estimated ejection fraction was in the   range of 40% to 45%. Mild diffuse hypokinesis with no   identifiable regional variations. Doppler parameters are   consistent with abnormal left ventricular relaxation (grade 1   diastolic dysfunction). - Aortic valve: There was mild stenosis. Valve area (VTI): 1.5   cm^2. Valve area (Vmax): 1.59 cm^2. Valve area (Vmean): 1.4 cm^2. - Mitral valve: Calcified annulus. - Left atrium: The atrium was severely dilated. - Right atrium: The atrium was moderately dilated. - Pericardium, extracardiac: There was a left pleural effusion.  LHC 10/23/16 1. Mild, non-obstructive coronary artery  disease consistent with non-ischemic cardiomyopathy. 2. Mildly elevated left ventricular filling pressure.   ASSESSMENT AND PLAN:  1.  Persistent atrial fibrillation: Currently anticoagulated with Eliquis.  She is on amiodarone as an antiarrhythmic.  We did discuss the possibility of ablation in the future.  At this point, she is planning for a lumpectomy and potentially further therapy of her left breast.  She would like to wait until this has been resolved prior to considering ablation.  Unfortunately, her left atrium is severely dilated and thus I think that she would need both medications as well as ablation long-term.  This patients CHA2DS2-VASc Score and unadjusted Ischemic Stroke Rate (% per year) is equal to 3.2 % stroke rate/year from a  score of 3  Above score calculated as 1 point each if present [CHF, HTN, DM, Vascular=MI/PAD/Aortic Plaque, Age if 65-74, or Female] Above score calculated as 2 points each if present [Age > 75, or Stroke/TIA/TE]     2. Nonischemic cardiomyopathy: Currently, her ejection fraction has improved with better control of atrial fibrillation.  Currently on optimal medical therapy.  Current medicines are reviewed at length with the patient today.   The patient does not have concerns regarding her medicines.  The following changes were made today: None  Labs/ tests ordered today include:  Orders Placed This Encounter  Procedures  . EKG 12-Lead     Disposition:   FU with Esaias Cleavenger 3 months  Signed, Giovanni Bath Meredith Leeds, MD  07/21/2018 4:17 PM     [address] [place] [address] [phone] (office) [phone] (fax)

## 2018-07-21 NOTE — Patient Instructions (Addendum)
Medication Instructions:  Your physician recommends that you continue on your current medications as directed. Please refer to the Current Medication list given to you today.  Labwork: None ordered.  Testing/Procedures: None ordered.  Follow-Up: Your physician wants you to follow-up in: 3 months with Dr. Curt Bears.  Any Other Special Instructions Will Be Listed Below (If Applicable).  If you need a refill on your cardiac medications before your next appointment, please call your pharmacy.

## 2018-07-22 ENCOUNTER — Encounter (HOSPITAL_COMMUNITY): Payer: Medicare HMO

## 2018-07-22 ENCOUNTER — Encounter (HOSPITAL_COMMUNITY)
Admission: RE | Admit: 2018-07-22 | Discharge: 2018-07-22 | Disposition: A | Discharge status: Home / Self Care | Payer: Medicare HMO | Source: Ambulatory Visit | Attending: Internal Medicine | Admitting: Internal Medicine

## 2018-07-22 DIAGNOSIS — I5022 Chronic systolic (congestive) heart failure: Secondary | ICD-10-CM

## 2018-07-22 DIAGNOSIS — N183 Chronic kidney disease, stage 3 (moderate): Secondary | ICD-10-CM | POA: Diagnosis not present

## 2018-07-22 DIAGNOSIS — D631 Anemia in chronic kidney disease: Secondary | ICD-10-CM | POA: Diagnosis not present

## 2018-07-22 NOTE — Progress Notes (Signed)
Daily Session Note  Patient Details  Name: Patricia Wagner MRN: [id number] Date of Birth: Apr 11, 1951 Referring Provider:     Pulmonary Rehab Walk Test from 07/15/2018 in [place]  Referring Provider  [name]      Encounter Date: 07/22/2018  Check In: Session Check In - 07/22/18 1316      Check-In   Supervising physician immediately available to respond to emergencies  [facility] immediately available    Physician(s)  Dr. [name]    Location  [facility]    Staff Present  Su Hilt, MS, ACSM RCEP, Exercise Physiologist;Carlette Wilber Oliphant, RN, BSN;Ramon Dredge, RN, MHA    Medication changes reported      No    Fall or balance concerns reported     No    Tobacco Cessation  No Change    Warm-up and Cool-down  Performed as group-led instruction    Resistance Training Performed  Yes    VAD Patient?  No    PAD/SET Patient?  No      Pain Assessment   Currently in Pain?  No/denies    Pain Score  0-No pain    Multiple Pain Sites  No       Capillary Blood Glucose: No results found for this or any previous visit (from the past 24 hour(s)).    Social History   Tobacco Use  Smoking Status Former Smoker  . Packs/day: 0.50  . Types: Cigarettes  . Start date: 2019  Smokeless Tobacco Never Used  Tobacco Comment   restarted back this year    Goals Met:  Exercise tolerated well  Goals Unmet:  Not Applicable  Comments: Service time is from 1:30p to 3:45p    Dr. [name] is Medical Director for Pulmonary Rehab at [facility].

## 2018-07-27 ENCOUNTER — Encounter (HOSPITAL_COMMUNITY): Payer: BLUE CROSS/BLUE SHIELD

## 2018-07-27 ENCOUNTER — Encounter (HOSPITAL_COMMUNITY): Payer: Medicare HMO

## 2018-07-27 ENCOUNTER — Encounter (HOSPITAL_COMMUNITY)
Admission: RE | Admit: 2018-07-27 | Discharge: 2018-07-27 | Disposition: A | Discharge status: Home / Self Care | Payer: Medicare HMO | Source: Ambulatory Visit | Attending: Nephrology | Admitting: Nephrology

## 2018-07-27 VITALS — Wt 238.1 lb

## 2018-07-27 DIAGNOSIS — N183 Chronic kidney disease, stage 3 (moderate): Secondary | ICD-10-CM | POA: Diagnosis not present

## 2018-07-27 DIAGNOSIS — I5022 Chronic systolic (congestive) heart failure: Secondary | ICD-10-CM

## 2018-07-27 DIAGNOSIS — D631 Anemia in chronic kidney disease: Secondary | ICD-10-CM | POA: Insufficient documentation

## 2018-07-27 NOTE — Progress Notes (Signed)
Daily Session Note  Patient Details  Name: Patricia Wagner MRN: [id number] Date of Birth: Jan 28, 1951 Referring Provider:     Pulmonary Rehab Walk Test from 07/15/2018 in [place]  Referring Provider  [name]      Encounter Date: 07/27/2018  Check In: Session Check In - 07/27/18 1526      Check-In   Supervising physician immediately available to respond to emergencies  [facility] immediately available    Physician(s)  Dr. Karleen Hampshire    Location  [facility]    Staff Present  Maurice Small, RN, BSN;Molly DiVincenzo, MS, ACSM RCEP, Exercise Physiologist;Rogina Schiano Ysidro Evert, RN;Maria Whitaker, RN, BSN    Medication changes reported      No    Fall or balance concerns reported     No    Tobacco Cessation  No Change    Warm-up and Cool-down  Performed as group-led Higher education careers adviser Performed  Yes    VAD Patient?  No    PAD/SET Patient?  No      Pain Assessment   Currently in Pain?  No/denies    Pain Score  0-No pain    Multiple Pain Sites  No       Capillary Blood Glucose: No results found for this or any previous visit (from the past 24 hour(s)).  Exercise Prescription Changes - 07/27/18 1600      Response to Exercise   Blood Pressure (Admit)  100/68    Blood Pressure (Exercise)  124/70    Blood Pressure (Exit)  120/80    Heart Rate (Admit)  93 bpm    Heart Rate (Exercise)  101 bpm    Heart Rate (Exit)  --   would not register   Oxygen Saturation (Admit)  93 %    Oxygen Saturation (Exercise)  91 %    Oxygen Saturation (Exit)  --   would not register   Rating of Perceived Exertion (Exercise)  11    Perceived Dyspnea (Exercise)  1    Duration  Progress to 45 minutes of aerobic exercise without signs/symptoms of physical distress    Intensity  Other (comment)   40-80% of HRR     Progression   Progression  Continue to progress workloads to maintain intensity without signs/symptoms of physical distress.       Resistance Training   Training Prescription  Yes    Weight  orange bands    Reps  10-15    Time  10 Minutes      NuStep   Level  2    SPM  80    Minutes  51    METs  1.5       Social History   Tobacco Use  Smoking Status Former Smoker  . Packs/day: 0.50  . Types: Cigarettes  . Start date: 2019  Smokeless Tobacco Never Used  Tobacco Comment   restarted back this year    Goals Met:  Exercise tolerated well No report of cardiac concerns or symptoms Strength training completed today  Goals Unmet:  Not Applicable  Comments: Service time is from 1330 to 1515    Dr. [name] is Medical Director for Pulmonary Rehab at [facility].

## 2018-07-29 ENCOUNTER — Encounter (HOSPITAL_COMMUNITY): Payer: Medicare HMO

## 2018-07-29 ENCOUNTER — Other Ambulatory Visit: Payer: Self-pay | Admitting: Surgery

## 2018-07-29 ENCOUNTER — Encounter (HOSPITAL_COMMUNITY)
Admission: RE | Admit: 2018-07-29 | Discharge: 2018-07-29 | Disposition: A | Discharge status: Home / Self Care | Payer: Medicare HMO | Source: Ambulatory Visit | Attending: Internal Medicine | Admitting: Internal Medicine

## 2018-07-29 DIAGNOSIS — I5022 Chronic systolic (congestive) heart failure: Secondary | ICD-10-CM

## 2018-07-29 DIAGNOSIS — D631 Anemia in chronic kidney disease: Secondary | ICD-10-CM | POA: Diagnosis not present

## 2018-07-29 DIAGNOSIS — N183 Chronic kidney disease, stage 3 (moderate): Secondary | ICD-10-CM | POA: Diagnosis not present

## 2018-07-29 DIAGNOSIS — D242 Benign neoplasm of left breast: Secondary | ICD-10-CM

## 2018-07-29 NOTE — Progress Notes (Signed)
Daily Session Note  Patient Details  Name: Patricia Wagner MRN: [id number] Date of Birth: May 31, 1951 Referring Provider:     Pulmonary Rehab Walk Test from 07/15/2018 in [place]  Referring Provider  [name]      Encounter Date: 07/29/2018  Check In: Session Check In - 07/29/18 1522      Check-In   Supervising physician immediately available to respond to emergencies  [facility] immediately available    Physician(s)  Dr. Lars Mage     Location  [facility]    Staff Present  Rodney Langton, RN;Joann Rion, RN, BSN;Carlette Wilber Oliphant, RN, BSN;Ramon Dredge, RN, MHA    Medication changes reported      No    Fall or balance concerns reported     No    Tobacco Cessation  No Change    Warm-up and Cool-down  Performed as group-led instruction    Resistance Training Performed  Yes    VAD Patient?  No    PAD/SET Patient?  No      Pain Assessment   Currently in Pain?  No/denies       Capillary Blood Glucose: No results found for this or any previous visit (from the past 24 hour(s)).    Social History   Tobacco Use  Smoking Status Former Smoker  . Packs/day: 0.50  . Types: Cigarettes  . Start date: 2019  Smokeless Tobacco Never Used  Tobacco Comment   restarted back this year    Goals Met:  Exercise tolerated well No report of cardiac concerns or symptoms Strength training completed today  Goals Unmet:  Not Applicable  Comments: Service time is from 1330 to 1505    Dr. [name] is Medical Director for Pulmonary Rehab at [facility].

## 2018-08-02 ENCOUNTER — Encounter (HOSPITAL_COMMUNITY): Payer: Medicare HMO

## 2018-08-03 ENCOUNTER — Encounter (HOSPITAL_COMMUNITY)
Admission: RE | Admit: 2018-08-03 | Discharge: 2018-08-03 | Disposition: A | Discharge status: Home / Self Care | Payer: Medicare HMO | Source: Ambulatory Visit | Attending: Internal Medicine | Admitting: Internal Medicine

## 2018-08-03 ENCOUNTER — Encounter (HOSPITAL_COMMUNITY)
Admission: RE | Admit: 2018-08-03 | Discharge: 2018-08-03 | Disposition: A | Discharge status: Home / Self Care | Payer: Medicare HMO | Source: Ambulatory Visit | Attending: Nephrology | Admitting: Nephrology

## 2018-08-03 ENCOUNTER — Encounter (HOSPITAL_COMMUNITY): Payer: Medicare HMO

## 2018-08-03 VITALS — Wt 239.6 lb

## 2018-08-03 DIAGNOSIS — N183 Chronic kidney disease, stage 3 unspecified: Secondary | ICD-10-CM

## 2018-08-03 DIAGNOSIS — D631 Anemia in chronic kidney disease: Secondary | ICD-10-CM | POA: Insufficient documentation

## 2018-08-03 DIAGNOSIS — I5022 Chronic systolic (congestive) heart failure: Secondary | ICD-10-CM

## 2018-08-03 LAB — POCT HEMOGLOBIN-HEMACUE: Hemoglobin: 11.2 g/dL — ABNORMAL LOW (ref 12.0–15.0)

## 2018-08-03 MED ORDER — EPOETIN ALFA 10000 UNIT/ML IJ SOLN
5000.0000 [IU] | INTRAMUSCULAR | Status: DC
  Administered 2018-08-03: 5000 [IU] via SUBCUTANEOUS

## 2018-08-03 MED ORDER — EPOETIN ALFA 10000 UNIT/ML IJ SOLN
INTRAMUSCULAR | Status: AC
  Filled 2018-08-03: qty 1

## 2018-08-03 NOTE — Progress Notes (Signed)
Daily Session Note  Patient Details  Name: Patricia Wagner MRN: [id number] Date of Birth: 02-Sep-1951 Referring Provider:     Pulmonary Rehab Walk Test from 07/15/2018 in [place]  Referring Provider  [name]      Encounter Date: 08/03/2018  Check In: Session Check In - 08/03/18 1341      Check-In   Supervising physician immediately available to respond to emergencies  [facility] immediately available    Physician(s)  Dr. Darrick Meigs    Location  [facility]    Staff Present  Rodney Langton, RN;Carlette Wilber Oliphant, RN, BSN;Ramon Dredge, RN, MHA;Molly DiVincenzo, MS, ACSM RCEP, Exercise Physiologist    Medication changes reported      No    Fall or balance concerns reported     No    Tobacco Cessation  No Change    Warm-up and Cool-down  Performed as group-led instruction    Resistance Training Performed  Yes    VAD Patient?  No    PAD/SET Patient?  No      Pain Assessment   Currently in Pain?  No/denies    Multiple Pain Sites  No       Capillary Blood Glucose: Results for orders placed or performed during the hospital encounter of 08/03/18 (from the past 24 hour(s))  Hemoglobin-hemacue, POC     Status: Abnormal   Collection Time: 08/03/18  1:02 PM  Result Value Ref Range   Hemoglobin 11.2 (L) 12.0 - 15.0 g/dL      Social History   Tobacco Use  Smoking Status Former Smoker  . Packs/day: 0.50  . Types: Cigarettes  . Start date: 2019  Smokeless Tobacco Never Used  Tobacco Comment   restarted back this year    Goals Met:  Exercise tolerated well No report of cardiac concerns or symptoms Strength training completed today  Goals Unmet:  Not Applicable  Comments: Service time is from 1330 to 1530    Dr. [name] is Medical Director for Pulmonary Rehab at [facility].

## 2018-08-04 NOTE — Progress Notes (Signed)
SKILYNN Wagner 67 y.o. female   DOB: 29-May-1951 MRN: [id number]          Nutrition 1. Chronic systolic CHF (congestive heart failure) (HCC)    Past Medical History:  Diagnosis Date  . Acute blood loss anemia 03/05/2018  . Acute hypercapnic respiratory failure ([place]) 04/30/2017  . Acute renal failure superimposed on chronic kidney disease ([place]) 10/20/2016  . Arthritis   . Atrial fibrillation ([place])    a. s/p multiple cardioversions; failed tikosyn/sotalol.  . Bradycardia 11/20/2016  . CAD in native artery, s/p cardiac cath with non obstructive CAD 10/24/2016  . Cerebrovascular accident (CVA) due to embolism of left cerebellar artery ([place])   . CHF (congestive heart failure) ([place])   . Chronic anticoagulation 03/15/2018  . Chronic atrial fibrillation ([place]) 03/05/2018  . Chronic systolic (congestive) heart failure ([place]) 03/05/2018  . CKD (chronic kidney disease) stage 3, GFR 30-59 ml/min (HCC)   . Coagulopathy ([place]) 03/05/2018  . DIVERTICULITIS, HX OF 07/25/2007  . Diverticulosis of colon with hemorrhage 07/22/2007   Qualifier: Diagnosis of  By: [name]    . DIVERTICULOSIS, COLON 07/22/2007  . Dyspnea   . Dysuria 07/21/2017  . Edema, peripheral    a. chronic BLE edema, R>L. Prior trauma from dog attack and accident.  . Essential hypertension 07/22/2007   Qualifier: Diagnosis of  By: [name]    . HLD (hyperlipidemia) 02/03/2008   Qualifier: Diagnosis of  By: [name], [name]   . HYPERLIPIDEMIA 02/03/2008  . Hypersomnia    declines w/u  . Hypertension   . Hypotension (arterial) 04/30/2017  . MENOPAUSAL DISORDER 01/09/2011  . Morbid obesity ([place]) 07/22/2007  . NICM (nonischemic cardiomyopathy) ([place]) 10/24/2016  . PAF (paroxysmal atrial fibrillation) ([place])   . Raynaud's syndrome 07/22/2007  . Stroke ([place]) 2017  . Suspected sleep apnea 05/20/2017  . THYROID NODULE, RIGHT 01/04/2010  . VITAMIN D DEFICIENCY 01/09/2011   Qualifier: Diagnosis of  By: [name], [name]      Meds  reviewed.   Current Outpatient Medications (Cardiovascular):  .  amiodarone (PACERONE) 200 MG tablet, Take 1 tablet (200 mg total) by mouth daily. .  carvedilol (COREG) 3.125 MG tablet, TAKE 1 TABLET (3.125 MG TOTAL) BY MOUTH 2 (TWO) TIMES DAILY. .  furosemide (LASIX) 20 MG tablet, Take 2 tablets (40 mg total) by mouth daily. .  hydrALAZINE (APRESOLINE) 50 MG tablet, Take 1 tablet (50 mg total) by mouth 3 (three) times daily. .  isosorbide mononitrate (IMDUR) 60 MG 24 hr tablet, Take 1 tablet (60 mg total) by mouth daily. .  metolazone (ZAROXOLYN) 2.5 MG tablet, Take 1 tablet (2.5 mg total) by mouth as needed (swelling/ wt gain). Take 1 Tablet as directed by the CHF Clinic .  ranolazine (RANEXA) 500 MG 12 hr tablet, TAKE 1 TABLET BY MOUTH TWICE A DAY .  rosuvastatin (CRESTOR) 10 MG tablet, Take 10 mg by mouth daily.    Current Outpatient Medications (Hematological):  .  apixaban (ELIQUIS) 5 MG TABS tablet, Take 1 tablet (5 mg total) by mouth 2 (two) times daily. Marland Kitchen  epoetin alfa (EPOGEN,PROCRIT) 43329 UNIT/ML injection, Inject 5,000 Units into the skin once a week.  Current Outpatient Medications (Other):  Marland Kitchen  Investigational - Study Medication, Take 1 tablet by mouth 2 (two) times daily. Study name: Galactic HF Study  Additional study details: Omecamtiv Mecarbil or Placebo .  potassium chloride SA (K-DUR,KLOR-CON) 20 MEQ tablet, Take 1 tablet (20 mEq total) by mouth daily. Take  4 tabs first day, then take 2 tabs daily  Ht: Ht Readings from Last 1 Encounters:  07/21/18 5\' 5"  (1.651 m)     Wt:  Wt Readings from Last 3 Encounters:  07/27/18 238 lb 1.6 oz (108 kg)  07/21/18 235 lb (106.6 kg)  07/19/18 231 lb (104.8 kg)     BMI: There is no height or weight on file to calculate BMI.     Current tobacco use? No  Labs:  Lipid Panel     Component Value Date/Time   CHOL 94 12/05/2017 1203   TRIG 54 12/05/2017 1203   HDL 47 12/05/2017 1203   CHOLHDL 2.0 12/05/2017 1203   VLDL 11  12/05/2017 1203   LDLCALC 36 12/05/2017 1203   LDLDIRECT 121.6 03/15/2012 1646    Lab Results  Component Value Date   HGBA1C 6.3 (H) 11/21/2016   Note Spoke with pt. Pt is obese.  Pt eats 2-3 meals a day; most prepared at home. Making healthy food choices the majority of the time.  Pt's Rate Your Plate results reviewed with pt. Pt does not avoid salty food; uses canned/ convenience food. Pt adds salt to food.  The role of sodium in lung disease reviewed with pt. Pt expressed understanding of the information reviewed. Educated pt on label reading, how to build a healthy plate, and portion sizes of foods. Pt expressed understanding of the information reviewed via feedback method.    Nutrition Diagnosis  obesity related to excessive energy intake as evidenced by a BMI of Body mass index is 40.82 kg/m.  Nutrition Intervention ? Pt's individual nutrition plan and goals reviewed with pt. ? Benefits of adopting healthy eating habits discussed when pt's Rate Your Plate reviewed.  Goal(s) 1. Pt to identify and limit food sources of sodium, saturated fat, trans fats, refined carbohydrates. 2. The pt will recognize symptoms that can interfere with adequate oral intake, such as shortness of breath, N/V, early satiety, fatigue, ability to secure and prepare food, taste and smell changes, chewing/swallowing difficulties, and/ or pain when eating. 3. Identify food quantities necessary to achieve wt loss of  -2# per week to a goal wt loss of 2.7-10.9 kg (6-24 lb) at graduation from pulmonary rehab.  Plan:  Pt to attend Pulmonary Nutrition class Will provide client-centered nutrition education as part of interdisciplinary care.    Monitor and Evaluate progress toward nutrition goal with team.   Laurina Bustle, MS, RD, LDN 08/04/2018 11:55 AM

## 2018-08-05 ENCOUNTER — Inpatient Hospital Stay (HOSPITAL_COMMUNITY): Payer: Medicare HMO

## 2018-08-05 ENCOUNTER — Emergency Department (HOSPITAL_COMMUNITY): Payer: Medicare HMO

## 2018-08-05 ENCOUNTER — Inpatient Hospital Stay (HOSPITAL_COMMUNITY)
Admission: EM | Admit: 2018-08-05 | Discharge: 2018-08-17 | DRG: 291 | Disposition: A | Discharge status: Home Health | Payer: Medicare HMO | Attending: Internal Medicine | Admitting: Internal Medicine

## 2018-08-05 ENCOUNTER — Encounter (HOSPITAL_COMMUNITY): Payer: Self-pay | Admitting: Emergency Medicine

## 2018-08-05 ENCOUNTER — Encounter (HOSPITAL_COMMUNITY): Payer: Medicare HMO

## 2018-08-05 DIAGNOSIS — Z66 Do not resuscitate: Secondary | ICD-10-CM | POA: Diagnosis not present

## 2018-08-05 DIAGNOSIS — R778 Other specified abnormalities of plasma proteins: Secondary | ICD-10-CM | POA: Diagnosis present

## 2018-08-05 DIAGNOSIS — R7989 Other specified abnormal findings of blood chemistry: Secondary | ICD-10-CM | POA: Diagnosis not present

## 2018-08-05 DIAGNOSIS — Z87891 Personal history of nicotine dependence: Secondary | ICD-10-CM

## 2018-08-05 DIAGNOSIS — Z8673 Personal history of transient ischemic attack (TIA), and cerebral infarction without residual deficits: Secondary | ICD-10-CM | POA: Diagnosis not present

## 2018-08-05 DIAGNOSIS — R471 Dysarthria and anarthria: Secondary | ICD-10-CM | POA: Diagnosis not present

## 2018-08-05 DIAGNOSIS — J9621 Acute and chronic respiratory failure with hypoxia: Secondary | ICD-10-CM

## 2018-08-05 DIAGNOSIS — N170 Acute kidney failure with tubular necrosis: Secondary | ICD-10-CM | POA: Diagnosis not present

## 2018-08-05 DIAGNOSIS — J9601 Acute respiratory failure with hypoxia: Secondary | ICD-10-CM

## 2018-08-05 DIAGNOSIS — Z789 Other specified health status: Secondary | ICD-10-CM

## 2018-08-05 DIAGNOSIS — I10 Essential (primary) hypertension: Secondary | ICD-10-CM | POA: Diagnosis present

## 2018-08-05 DIAGNOSIS — J984 Other disorders of lung: Secondary | ICD-10-CM | POA: Diagnosis not present

## 2018-08-05 DIAGNOSIS — I5043 Acute on chronic combined systolic (congestive) and diastolic (congestive) heart failure: Secondary | ICD-10-CM | POA: Diagnosis present

## 2018-08-05 DIAGNOSIS — G4730 Sleep apnea, unspecified: Secondary | ICD-10-CM | POA: Diagnosis present

## 2018-08-05 DIAGNOSIS — I455 Other specified heart block: Secondary | ICD-10-CM | POA: Diagnosis present

## 2018-08-05 DIAGNOSIS — Z825 Family history of asthma and other chronic lower respiratory diseases: Secondary | ICD-10-CM | POA: Diagnosis not present

## 2018-08-05 DIAGNOSIS — R4781 Slurred speech: Secondary | ICD-10-CM | POA: Diagnosis not present

## 2018-08-05 DIAGNOSIS — J690 Pneumonitis due to inhalation of food and vomit: Secondary | ICD-10-CM

## 2018-08-05 DIAGNOSIS — I130 Hypertensive heart and chronic kidney disease with heart failure and stage 1 through stage 4 chronic kidney disease, or unspecified chronic kidney disease: Principal | ICD-10-CM | POA: Diagnosis present

## 2018-08-05 DIAGNOSIS — I2510 Atherosclerotic heart disease of native coronary artery without angina pectoris: Secondary | ICD-10-CM | POA: Diagnosis present

## 2018-08-05 DIAGNOSIS — I480 Paroxysmal atrial fibrillation: Secondary | ICD-10-CM | POA: Diagnosis present

## 2018-08-05 DIAGNOSIS — I4892 Unspecified atrial flutter: Secondary | ICD-10-CM | POA: Diagnosis present

## 2018-08-05 DIAGNOSIS — I509 Heart failure, unspecified: Secondary | ICD-10-CM | POA: Diagnosis not present

## 2018-08-05 DIAGNOSIS — E559 Vitamin D deficiency, unspecified: Secondary | ICD-10-CM | POA: Diagnosis present

## 2018-08-05 DIAGNOSIS — R29810 Facial weakness: Secondary | ICD-10-CM | POA: Diagnosis present

## 2018-08-05 DIAGNOSIS — N184 Chronic kidney disease, stage 4 (severe): Secondary | ICD-10-CM | POA: Diagnosis not present

## 2018-08-05 DIAGNOSIS — I5022 Chronic systolic (congestive) heart failure: Secondary | ICD-10-CM | POA: Diagnosis not present

## 2018-08-05 DIAGNOSIS — Z95828 Presence of other vascular implants and grafts: Secondary | ICD-10-CM

## 2018-08-05 DIAGNOSIS — I5082 Biventricular heart failure: Secondary | ICD-10-CM | POA: Diagnosis present

## 2018-08-05 DIAGNOSIS — R748 Abnormal levels of other serum enzymes: Secondary | ICD-10-CM | POA: Diagnosis not present

## 2018-08-05 DIAGNOSIS — J9622 Acute and chronic respiratory failure with hypercapnia: Secondary | ICD-10-CM | POA: Diagnosis not present

## 2018-08-05 DIAGNOSIS — Z833 Family history of diabetes mellitus: Secondary | ICD-10-CM

## 2018-08-05 DIAGNOSIS — G9341 Metabolic encephalopathy: Secondary | ICD-10-CM | POA: Diagnosis not present

## 2018-08-05 DIAGNOSIS — N179 Acute kidney failure, unspecified: Secondary | ICD-10-CM | POA: Diagnosis not present

## 2018-08-05 DIAGNOSIS — I248 Other forms of acute ischemic heart disease: Secondary | ICD-10-CM | POA: Diagnosis not present

## 2018-08-05 DIAGNOSIS — K5730 Diverticulosis of large intestine without perforation or abscess without bleeding: Secondary | ICD-10-CM | POA: Diagnosis present

## 2018-08-05 DIAGNOSIS — N289 Disorder of kidney and ureter, unspecified: Secondary | ICD-10-CM

## 2018-08-05 DIAGNOSIS — R570 Cardiogenic shock: Secondary | ICD-10-CM | POA: Diagnosis not present

## 2018-08-05 DIAGNOSIS — E873 Alkalosis: Secondary | ICD-10-CM | POA: Diagnosis not present

## 2018-08-05 DIAGNOSIS — Z7901 Long term (current) use of anticoagulants: Secondary | ICD-10-CM | POA: Diagnosis not present

## 2018-08-05 DIAGNOSIS — E785 Hyperlipidemia, unspecified: Secondary | ICD-10-CM | POA: Diagnosis present

## 2018-08-05 DIAGNOSIS — I428 Other cardiomyopathies: Secondary | ICD-10-CM | POA: Diagnosis present

## 2018-08-05 DIAGNOSIS — N189 Chronic kidney disease, unspecified: Secondary | ICD-10-CM | POA: Diagnosis not present

## 2018-08-05 DIAGNOSIS — I5042 Chronic combined systolic (congestive) and diastolic (congestive) heart failure: Secondary | ICD-10-CM | POA: Diagnosis present

## 2018-08-05 DIAGNOSIS — R0902 Hypoxemia: Secondary | ICD-10-CM | POA: Diagnosis not present

## 2018-08-05 DIAGNOSIS — D242 Benign neoplasm of left breast: Secondary | ICD-10-CM | POA: Diagnosis present

## 2018-08-05 DIAGNOSIS — Z8249 Family history of ischemic heart disease and other diseases of the circulatory system: Secondary | ICD-10-CM

## 2018-08-05 DIAGNOSIS — D631 Anemia in chronic kidney disease: Secondary | ICD-10-CM | POA: Diagnosis present

## 2018-08-05 DIAGNOSIS — E876 Hypokalemia: Secondary | ICD-10-CM | POA: Diagnosis not present

## 2018-08-05 DIAGNOSIS — R17 Unspecified jaundice: Secondary | ICD-10-CM | POA: Diagnosis present

## 2018-08-05 DIAGNOSIS — I63442 Cerebral infarction due to embolism of left cerebellar artery: Secondary | ICD-10-CM | POA: Diagnosis not present

## 2018-08-05 DIAGNOSIS — R740 Nonspecific elevation of levels of transaminase and lactic acid dehydrogenase [LDH]: Secondary | ICD-10-CM | POA: Diagnosis not present

## 2018-08-05 DIAGNOSIS — J9602 Acute respiratory failure with hypercapnia: Secondary | ICD-10-CM | POA: Diagnosis not present

## 2018-08-05 DIAGNOSIS — I110 Hypertensive heart disease with heart failure: Secondary | ICD-10-CM | POA: Diagnosis not present

## 2018-08-05 DIAGNOSIS — I7300 Raynaud's syndrome without gangrene: Secondary | ICD-10-CM | POA: Diagnosis present

## 2018-08-05 DIAGNOSIS — I481 Persistent atrial fibrillation: Secondary | ICD-10-CM | POA: Diagnosis present

## 2018-08-05 DIAGNOSIS — I4510 Unspecified right bundle-branch block: Secondary | ICD-10-CM | POA: Diagnosis present

## 2018-08-05 DIAGNOSIS — T502X5A Adverse effect of carbonic-anhydrase inhibitors, benzothiadiazides and other diuretics, initial encounter: Secondary | ICD-10-CM | POA: Diagnosis not present

## 2018-08-05 DIAGNOSIS — J449 Chronic obstructive pulmonary disease, unspecified: Secondary | ICD-10-CM | POA: Diagnosis present

## 2018-08-05 DIAGNOSIS — D696 Thrombocytopenia, unspecified: Secondary | ICD-10-CM | POA: Diagnosis present

## 2018-08-05 DIAGNOSIS — J810 Acute pulmonary edema: Secondary | ICD-10-CM | POA: Diagnosis not present

## 2018-08-05 DIAGNOSIS — Z452 Encounter for adjustment and management of vascular access device: Secondary | ICD-10-CM | POA: Diagnosis not present

## 2018-08-05 DIAGNOSIS — R945 Abnormal results of liver function studies: Secondary | ICD-10-CM | POA: Diagnosis not present

## 2018-08-05 DIAGNOSIS — Z4682 Encounter for fitting and adjustment of non-vascular catheter: Secondary | ICD-10-CM | POA: Diagnosis not present

## 2018-08-05 DIAGNOSIS — Z888 Allergy status to other drugs, medicaments and biological substances status: Secondary | ICD-10-CM

## 2018-08-05 DIAGNOSIS — R3129 Other microscopic hematuria: Secondary | ICD-10-CM | POA: Diagnosis present

## 2018-08-05 DIAGNOSIS — Z0189 Encounter for other specified special examinations: Secondary | ICD-10-CM

## 2018-08-05 DIAGNOSIS — I5021 Acute systolic (congestive) heart failure: Secondary | ICD-10-CM | POA: Diagnosis not present

## 2018-08-05 LAB — CBC
HEMATOCRIT: 40.5 % (ref 36.0–46.0)
HEMOGLOBIN: 11.4 g/dL — AB (ref 12.0–15.0)
MCH: 23.4 pg — AB (ref 26.0–34.0)
MCHC: 28.1 g/dL — ABNORMAL LOW (ref 30.0–36.0)
MCV: 83 fL (ref 78.0–100.0)
Platelets: 194 10*3/uL (ref 150–400)
RBC: 4.88 MIL/uL (ref 3.87–5.11)
RDW: 23.9 % — ABNORMAL HIGH (ref 11.5–15.5)
WBC: 5.6 10*3/uL (ref 4.0–10.5)

## 2018-08-05 LAB — I-STAT TROPONIN, ED: Troponin i, poc: 0.16 ng/mL (ref 0.00–0.08)

## 2018-08-05 LAB — I-STAT CHEM 8, ED
BUN: 62 mg/dL — ABNORMAL HIGH (ref 8–23)
CREATININE: 3.7 mg/dL — AB (ref 0.44–1.00)
Calcium, Ion: 1.1 mmol/L — ABNORMAL LOW (ref 1.15–1.40)
Chloride: 102 mmol/L (ref 98–111)
GLUCOSE: 72 mg/dL (ref 70–99)
HEMATOCRIT: 44 % (ref 36.0–46.0)
Hemoglobin: 15 g/dL (ref 12.0–15.0)
Potassium: 3.7 mmol/L (ref 3.5–5.1)
Sodium: 137 mmol/L (ref 135–145)
TCO2: 26 mmol/L (ref 22–32)

## 2018-08-05 LAB — COMPREHENSIVE METABOLIC PANEL
ALK PHOS: 174 U/L — AB (ref 38–126)
ALT: 92 U/L — AB (ref 0–44)
AST: 58 U/L — ABNORMAL HIGH (ref 15–41)
Albumin: 3.5 g/dL (ref 3.5–5.0)
Anion gap: 15 (ref 5–15)
BUN: 65 mg/dL — AB (ref 8–23)
CALCIUM: 9.3 mg/dL (ref 8.9–10.3)
CO2: 23 mmol/L (ref 22–32)
CREATININE: 3.79 mg/dL — AB (ref 0.44–1.00)
Chloride: 101 mmol/L (ref 98–111)
GFR, EST AFRICAN AMERICAN: 13 mL/min — AB (ref >60)
GFR, EST NON AFRICAN AMERICAN: 12 mL/min — AB (ref >60)
Glucose, Bld: 78 mg/dL (ref 70–99)
Potassium: 3.9 mmol/L (ref 3.5–5.1)
SODIUM: 139 mmol/L (ref 135–145)
Total Bilirubin: 4.9 mg/dL — ABNORMAL HIGH (ref 0.3–1.2)
Total Protein: 6.6 g/dL (ref 6.5–8.1)

## 2018-08-05 LAB — DIFFERENTIAL
BASOS PCT: 0 %
Basophils Absolute: 0 10*3/uL (ref 0.0–0.1)
Eosinophils Absolute: 0 10*3/uL (ref 0.0–0.7)
Eosinophils Relative: 0 %
LYMPHS PCT: 11 %
Lymphs Abs: 0.6 10*3/uL — ABNORMAL LOW (ref 0.7–4.0)
MONOS PCT: 12 %
Monocytes Absolute: 0.7 10*3/uL (ref 0.1–1.0)
NEUTROS ABS: 4.3 10*3/uL (ref 1.7–7.7)
Neutrophils Relative %: 77 %

## 2018-08-05 LAB — APTT: aPTT: 34 seconds (ref 24–36)

## 2018-08-05 LAB — BRAIN NATRIURETIC PEPTIDE: B NATRIURETIC PEPTIDE 5: 3951.4 pg/mL — AB (ref 0.0–100.0)

## 2018-08-05 LAB — PROTIME-INR
INR: 2.46
Prothrombin Time: 26.5 seconds — ABNORMAL HIGH (ref 11.4–15.2)

## 2018-08-05 MED ORDER — CARVEDILOL 3.125 MG PO TABS
3.1250 mg | ORAL_TABLET | Freq: Two times a day (BID) | ORAL | Status: DC
  Filled 2018-08-05: qty 1

## 2018-08-05 MED ORDER — SODIUM CHLORIDE 0.9% FLUSH
3.0000 mL | INTRAVENOUS | Status: DC | PRN
  Administered 2018-08-14 – 2018-08-16 (×2): 3 mL via INTRAVENOUS
  Filled 2018-08-05 (×2): qty 3

## 2018-08-05 MED ORDER — FUROSEMIDE 10 MG/ML IJ SOLN
40.0000 mg | Freq: Two times a day (BID) | INTRAMUSCULAR | Status: DC
  Administered 2018-08-06 – 2018-08-09 (×8): 40 mg via INTRAVENOUS
  Filled 2018-08-05 (×8): qty 4

## 2018-08-05 MED ORDER — ONDANSETRON HCL 4 MG/2ML IJ SOLN: 4.0000 mg | Freq: Four times a day (QID) | INTRAMUSCULAR | Status: DC | PRN

## 2018-08-05 MED ORDER — HYDRALAZINE HCL 50 MG PO TABS: 50.0000 mg | ORAL_TABLET | Freq: Three times a day (TID) | ORAL | Status: DC

## 2018-08-05 MED ORDER — STROKE: EARLY STAGES OF RECOVERY BOOK
Freq: Once | Status: DC
  Filled 2018-08-05: qty 1

## 2018-08-05 MED ORDER — AMIODARONE HCL 200 MG PO TABS: 200.0000 mg | ORAL_TABLET | Freq: Every day | ORAL | Status: DC

## 2018-08-05 MED ORDER — ACETAMINOPHEN 325 MG PO TABS: 650.0000 mg | ORAL_TABLET | ORAL | Status: DC | PRN

## 2018-08-05 MED ORDER — SODIUM CHLORIDE 0.9 % IV SOLN
250.0000 mL | INTRAVENOUS | Status: DC | PRN
  Administered 2018-08-13: 250 mL via INTRAVENOUS

## 2018-08-05 MED ORDER — SODIUM CHLORIDE 0.9% FLUSH
3.0000 mL | Freq: Two times a day (BID) | INTRAVENOUS | Status: DC
  Administered 2018-08-06 – 2018-08-13 (×11): 3 mL via INTRAVENOUS
  Administered 2018-08-13 – 2018-08-14 (×2): 10 mL via INTRAVENOUS
  Administered 2018-08-15: 3 mL via INTRAVENOUS
  Administered 2018-08-15: 10 mL via INTRAVENOUS
  Administered 2018-08-16 – 2018-08-17 (×2): 3 mL via INTRAVENOUS

## 2018-08-05 MED ORDER — APIXABAN 5 MG PO TABS
5.0000 mg | ORAL_TABLET | Freq: Two times a day (BID) | ORAL | Status: DC
  Filled 2018-08-05: qty 1

## 2018-08-05 MED ORDER — ISOSORBIDE MONONITRATE ER 60 MG PO TB24: 60.0000 mg | ORAL_TABLET | Freq: Every day | ORAL | Status: DC

## 2018-08-05 MED ORDER — RANOLAZINE ER 500 MG PO TB12
500.0000 mg | ORAL_TABLET | Freq: Two times a day (BID) | ORAL | Status: DC
  Filled 2018-08-05: qty 1

## 2018-08-05 NOTE — ED Provider Notes (Addendum)
[facility] EMERGENCY DEPARTMENT Provider Note   CSN: [id number] Arrival date & time: 08/05/18  1942     History   Chief Complaint Chief Complaint  Patient presents with  . Aphasia  . Facial Droop  . Weakness    HPI Patricia Wagner is a 67 y.o. female.  HPI Patient presents with shortness of breath and fatigue.  Has been going on for around the last week.  Decreased energy.  Has some facial droop and drooling.  Patient family states she gets like this when she gets sick.  Has had occasional dull chest pain.  Has had worsening shortness of breath.  Upon arrival found to have saturations of 77%.  Not normally on oxygen.  History of CHF and feels as if she is carrying extra fluid.  No abdominal pain.  No dysuria.  Has some confusion. Past Medical History:  Diagnosis Date  . Acute blood loss anemia 03/05/2018  . Acute hypercapnic respiratory failure ([place]) 04/30/2017  . Acute renal failure superimposed on chronic kidney disease ([place]) 10/20/2016  . Arthritis   . Atrial fibrillation ([place])    a. s/p multiple cardioversions; failed tikosyn/sotalol.  . Bradycardia 11/20/2016  . CAD in native artery, s/p cardiac cath with non obstructive CAD 10/24/2016  . Cerebrovascular accident (CVA) due to embolism of left cerebellar artery ([place])   . CHF (congestive heart failure) ([place])   . Chronic anticoagulation 03/15/2018  . Chronic atrial fibrillation ([place]) 03/05/2018  . Chronic systolic (congestive) heart failure ([place]) 03/05/2018  . CKD (chronic kidney disease) stage 3, GFR 30-59 ml/min (HCC)   . Coagulopathy ([place]) 03/05/2018  . DIVERTICULITIS, HX OF 07/25/2007  . Diverticulosis of colon with hemorrhage 07/22/2007   Qualifier: Diagnosis of  By: [name]    . DIVERTICULOSIS, COLON 07/22/2007  . Dyspnea   . Dysuria 07/21/2017  . Edema, peripheral    a. chronic BLE edema, R>L. Prior trauma from dog attack and accident.  . Essential hypertension 07/22/2007   Qualifier: Diagnosis of   By: [name]    . HLD (hyperlipidemia) 02/03/2008   Qualifier: Diagnosis of  By: [name], [name]   . HYPERLIPIDEMIA 02/03/2008  . Hypersomnia    declines w/u  . Hypertension   . Hypotension (arterial) 04/30/2017  . MENOPAUSAL DISORDER 01/09/2011  . Morbid obesity ([place]) 07/22/2007  . NICM (nonischemic cardiomyopathy) ([place]) 10/24/2016  . PAF (paroxysmal atrial fibrillation) ([place])   . Raynaud's syndrome 07/22/2007  . Stroke ([place]) 2017  . Suspected sleep apnea 05/20/2017  . THYROID NODULE, RIGHT 01/04/2010  . VITAMIN D DEFICIENCY 01/09/2011   Qualifier: Diagnosis of  By: [name], [name]     Patient Active Problem List   Diagnosis Date Noted  . Lower GI bleed 03/15/2018  . Chronic anticoagulation 03/15/2018  . Rectal bleed   . CKD (chronic kidney disease) stage 3, GFR 30-59 ml/min (HCC) 03/05/2018  . Coagulopathy ([place]) 03/05/2018  . Chronic systolic (congestive) heart failure ([place]) 03/05/2018  . Chronic atrial fibrillation ([place]) 03/05/2018  . Acute blood loss anemia 03/05/2018  . Diverticulosis 03/05/2018  . Heme positive stool 03/05/2018  . Urine abnormality 02/02/2018  . Cough 02/02/2018  . Bacteremia due to Gram-negative bacteria 12/30/2017  . Increased anion gap metabolic acidosis 76/19/5093  . Metabolic alkalosis 26/71/2458  . Acute UTI 12/29/2017  . Obstructive uropathy   . Anemia 12/17/2017  . Diarrhea 11/30/2017  . Urinary retention 11/23/2017  . Lower abdominal pain 11/23/2017  . Dysuria 07/21/2017  .  Suspected sleep apnea 05/20/2017  . Pressure injury of skin 05/02/2017  . Acute hypercapnic respiratory failure ([place]) 04/30/2017  . Hypotension (arterial) 04/30/2017  . Bradycardia 11/20/2016  . Cerebrovascular accident (CVA) due to embolism of left cerebellar artery ([place])   . PAF (paroxysmal atrial fibrillation) ([place])   . CAD in native artery, s/p cardiac cath with non obstructive CAD 10/24/2016  . NICM (nonischemic cardiomyopathy) ([place]) 10/24/2016  . Tobacco  abuse 10/20/2016  . Acute renal failure superimposed on chronic kidney disease ([place]) 10/20/2016  . Hematochezia 06/29/2013  . Degenerative arthritis of left knee 03/15/2012  . Encounter for well adult exam with abnormal findings 03/12/2012  . Hypersomnia   . VITAMIN D DEFICIENCY 01/09/2011  . MENOPAUSAL DISORDER 01/09/2011  . THYROID NODULE, RIGHT 01/04/2010  . HLD (hyperlipidemia) 02/03/2008  . LEG CRAMPS, NOCTURNAL 02/03/2008  . LOW BACK PAIN 07/25/2007  . Morbid obesity ([place]) 07/22/2007  . Essential hypertension 07/22/2007  . Raynaud's syndrome 07/22/2007  . Diverticulosis of colon with hemorrhage 07/22/2007  . SYMPTOM, EDEMA 07/22/2007    Past Surgical History:  Procedure Laterality Date  . CARDIAC CATHETERIZATION N/A 10/23/2016   Procedure: Left Heart Cath and Coronary Angiography;  Surgeon: [name], MD;  Location: [facility];  Service: Cardiovascular;  Laterality: N/A;  . CARDIOVERSION N/A 10/31/2016   Procedure: CARDIOVERSION;  Surgeon: [name], MD;  Location: [facility];  Service: Cardiovascular;  Laterality: N/A;  . CARDIOVERSION N/A 11/03/2016   Procedure: CARDIOVERSION;  Surgeon: [name], MD;  Location: [facility];  Service: Cardiovascular;  Laterality: N/A;  . CARDIOVERSION N/A 11/18/2016   Procedure: CARDIOVERSION;  Surgeon: [name], MD;  Location: [facility];  Service: Cardiovascular;  Laterality: N/A;  . CARDIOVERSION N/A 03/25/2017   Procedure: CARDIOVERSION;  Surgeon: [name], MD;  Location: [facility] ENDOSCOPY;  Service: Cardiovascular;  Laterality: N/A;  . CARDIOVERSION N/A 05/06/2017   Procedure: CARDIOVERSION;  Surgeon: [name], MD;  Location: [facility] ENDOSCOPY;  Service: Cardiovascular;  Laterality: N/A;  . CARDIOVERSION N/A 05/12/2017   Procedure: CARDIOVERSION;  Surgeon: [name], MD;  Location: [facility] ENDOSCOPY;  Service: Cardiovascular;  Laterality: N/A;  . COLONOSCOPY N/A 12/04/2017   Procedure:  COLONOSCOPY;  Surgeon: [name], MD;  Location: [facility];  Service: Endoscopy;  Laterality: N/A;  . COLONOSCOPY W/ POLYPECTOMY  02/2011   pan diverticulosis.  tubular adenoma without dysplasia on 5 mm sigmoid polyp.  Dr Fuller Plan.    Marland Kitchen PARTIAL HYSTERECTOMY     1 OVARY LEFT  . TEE WITHOUT CARDIOVERSION N/A 10/31/2016   Procedure: TRANSESOPHAGEAL ECHOCARDIOGRAM (TEE);  Surgeon: [name], MD;  Location: [facility] ENDOSCOPY;  Service: Cardiovascular;  Laterality: N/A;  . TEE WITHOUT CARDIOVERSION N/A 03/25/2017   Procedure: TRANSESOPHAGEAL ECHOCARDIOGRAM (TEE);  Surgeon: [name], MD;  Location: [facility] ENDOSCOPY;  Service: Cardiovascular;  Laterality: N/A;     OB History   None      Home Medications    Prior to Admission medications   Medication Sig Start Date End Date Taking? Authorizing Provider  amiodarone (PACERONE) 200 MG tablet Take 1 tablet (200 mg total) by mouth daily. 03/11/18   Amin, Jeanella Flattery, MD  apixaban (ELIQUIS) 5 MG TABS tablet Take 1 tablet (5 mg total) by mouth 2 (two) times daily. 04/15/18   Georgiana Shore, NP  carvedilol (COREG) 3.125 MG tablet TAKE 1 TABLET (3.125 MG TOTAL) BY MOUTH 2 (TWO) TIMES DAILY. 07/02/18 09/30/18  [name], MD  epoetin alfa (EPOGEN,PROCRIT) 58527 UNIT/ML injection Inject 5,000 Units into the skin once a week.    [provider]  furosemide (LASIX) 20 MG tablet Take 2 tablets (40 mg total) by mouth daily. 06/24/18   Bensimhon, Shaune Pascal, MD  hydrALAZINE (APRESOLINE) 50 MG tablet Take 1 tablet (50 mg total) by mouth 3 (three) times daily. 04/15/18 07/15/18  Georgiana Shore, NP  Investigational - Study Medication Take 1 tablet by mouth 2 (two) times daily. Study name: Galactic HF Study  Additional study details: Omecamtiv Mecarbil or Placebo 03/18/18   Larey Dresser, MD  isosorbide mononitrate (IMDUR) 60 MG 24 hr tablet Take 1 tablet (60 mg total) by mouth daily. 04/15/18 07/15/18  Georgiana Shore, NP  metolazone (ZAROXOLYN) 2.5 MG  tablet Take 1 tablet (2.5 mg total) by mouth as needed (swelling/ wt gain). Take 1 Tablet as directed by the CHF Clinic 05/25/18   Clegg, Amy D, NP  potassium chloride SA (K-DUR,KLOR-CON) 20 MEQ tablet Take 1 tablet (20 mEq total) by mouth daily. Take 4 tabs first day, then take 2 tabs daily 06/15/18   Shirley Friar, PA-C  ranolazine (RANEXA) 500 MG 12 hr tablet TAKE 1 TABLET BY MOUTH TWICE A DAY 03/16/18   Bensimhon, Shaune Pascal, MD  rosuvastatin (CRESTOR) 10 MG tablet Take 10 mg by mouth daily.    [provider]    Family History Family History  Problem Relation Age of Onset  . Asthma Mother   . Diabetes Father   . Heart disease Father        Died of presumed heart attack - 40s  . Lung disease Sister   . Heart disease Sister        Twin sister has heart issue, unclear what kind  . Thyroid disease Neg Hx     Social History Social History   Tobacco Use  . Smoking status: Former Smoker    Packs/day: 0.50    Types: Cigarettes    Start date: 2019  . Smokeless tobacco: Never Used  . Tobacco comment: restarted back this year  Substance Use Topics  . Alcohol use: No    Alcohol/week: 4.0 standard drinks    Types: 4 Cans of beer per week    Frequency: Never    Comment: Intermittent  . Drug use: No     Allergies   Ace inhibitors   Review of Systems Review of Systems  Constitutional: Positive for appetite change.  HENT: Positive for congestion.   Respiratory: Positive for shortness of breath.   Cardiovascular: Positive for chest pain and leg swelling.  Gastrointestinal: Negative for abdominal pain.  Genitourinary: Negative for flank pain.  Musculoskeletal: Negative for back pain.  Skin: Negative for wound.  Neurological: Positive for speech difficulty.  Hematological: Negative for adenopathy.  Psychiatric/Behavioral: Negative for behavioral problems.     Physical Exam Updated Vital Signs BP 127/78   Pulse 60   Temp 98.2 F (36.8 C) (Oral)   Resp (!)  22   SpO2 99%   Physical Exam  Constitutional: She appears well-developed.  HENT:  Face is symmetric with equal smile but does have some tearing on left eye and drooling of the left side of mouth.  Somewhat slurred speech.  Eyes: Pupils are equal, round, and reactive to light.  Neck: Neck supple.  Cardiovascular: Normal rate.  Pulmonary/Chest:  Rales bilateral bases.  Abdominal: There is no tenderness.  Musculoskeletal: She exhibits edema.  Pitting edema bilateral lower extremities.  Neurological: She is alert.  Good grip strength bilaterally.  Some drool on the left side of mouth but equal smile.  Mildly slurred speech.  Awake appears overall appropriate but somewhat slow to answer some of the questions.  Skin: Skin is warm. Capillary refill takes less than 2 seconds.     ED Treatments / Results  Labs (all labs ordered are listed, but only abnormal results are displayed) Labs Reviewed  PROTIME-INR - Abnormal; Notable for the following components:      Result Value   Prothrombin Time 26.5 (*)    All other components within normal limits  CBC - Abnormal; Notable for the following components:   Hemoglobin 11.4 (*)    MCH 23.4 (*)    MCHC 28.1 (*)    RDW 23.9 (*)    All other components within normal limits  DIFFERENTIAL - Abnormal; Notable for the following components:   Lymphs Abs 0.6 (*)    All other components within normal limits  COMPREHENSIVE METABOLIC PANEL - Abnormal; Notable for the following components:   BUN 65 (*)    Creatinine, Ser 3.79 (*)    AST 58 (*)    ALT 92 (*)    Alkaline Phosphatase 174 (*)    Total Bilirubin 4.9 (*)    GFR calc non Af Amer 12 (*)    GFR calc Af Amer 13 (*)    All other components within normal limits  I-STAT TROPONIN, ED - Abnormal; Notable for the following components:   Troponin i, poc 0.16 (*)    All other components within normal limits  I-STAT CHEM 8, ED - Abnormal; Notable for the following components:   BUN 62 (*)     Creatinine, Ser 3.70 (*)    Calcium, Ion 1.10 (*)    All other components within normal limits  APTT  BRAIN NATRIURETIC PEPTIDE  CBG MONITORING, ED    EKG EKG Interpretation  Date/Time:  Thursday August 05 2018 21:02:46 EDT Ventricular Rate:  63 PR Interval:    QRS Duration: 147 QT Interval:  464 QTC Calculation: 475 R Axis:   111 Text Interpretation:  Sinus rhythm Prolonged PR interval Probable left atrial enlargement RBBB and LPFB Confirmed by Davonna Belling [phone]) on 08/05/2018 10:22:56 PM   Radiology Dg Chest 2 View  Result Date: 08/05/2018 CLINICAL DATA:  Slurred speech, facial droop. EXAM: CHEST - 2 VIEW COMPARISON:  Chest x-rays dated 03/05/2018 and 03/30/2017. FINDINGS: Stable cardiomegaly. Chronic central pulmonary vascular congestion. No new confluent opacity to suggest a developing pneumonia. N questionable trace pleural effusion at the posterior costophrenic angles. No large pleural effusion. No pneumothorax. Osseous structures are unremarkable. IMPRESSION: 1. Stable cardiomegaly. Chronic central pulmonary vascular congestion, consistent with chronic mild CHF. 2. Questionable small pleural effusion at the posterior costophrenic angle. No evidence of pneumonia or overt alveolar pulmonary edema. Electronically Signed   By: Franki Cabot M.D.   On: 08/05/2018 21:58   Ct Head Wo Contrast  Result Date: 08/05/2018 CLINICAL DATA:  Slurred speech. Decreased grip strength. Right facial droop. Symptoms for 1 week. EXAM: CT HEAD WITHOUT CONTRAST TECHNIQUE: Contiguous axial images were obtained from the base of the skull through the vertex without intravenous contrast. COMPARISON:  12/29/2017 FINDINGS: Brain: Encephalomalacia in the left cerebellar hemisphere is stable. There is encephalomalacia in the right posterior parietal lobe which is stable. There is no mass effect, midline shift, or acute hemorrhage. Vascular: No hyperdense vessel or unexpected calcification. Skull: Intact.  Sinuses/Orbits: There is scattered  fluid in the bilateral mastoid air cells. This is a new finding compared to the prior study. Visualized paranasal sinuses are clear. Unremarkable orbits. 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 IMPRESSION: No acute intracranial pathology. There is fluid in the bilateral mastoid air cells Chronic changes are noted. Electronically Signed   By: Marybelle Killings M.D.   On: 08/05/2018 21:49    Procedures Procedures (including critical care time)  Medications Ordered in ED Medications - No data to display   Initial Impression / Assessment and Plan / ED Course  I have reviewed the triage vital signs and the nursing notes.  Pertinent labs & imaging results that were available during my care of the patient were reviewed by me and considered in my medical decision making (see chart for details).     Patient with hypoxia on room air.  Likely related to her CHF and underlying heart disease.  Improved on oxygen.  Some drooling on the left side of mouth but not frank facial droop.  Somewhat slow to answer.  Reportedly gets like this when she gets sick.  Worsening renal function.  Troponin mildly elevated.  I feel patient benefit from admission to the hospital.  Not a TPA candidate due to anticoagulation and time of onset and minor neurologic deficits of the slurred speech.  Patient Has Foley Catheter in Place.  she keeps the catheter with a cap on it and just intermittently drains it  Final Clinica.l Impressions(s) / ED Diagnoses   Final diagnoses:  Congestive heart failure, unspecified HF chronicity, unspecified heart failure type [facility])  Renal insufficiency  Elevated troponin    ED Discharge Orders    None       Davonna Belling, MD 08/05/18 2226    Davonna Belling, MD 08/06/18 [phone]

## 2018-08-05 NOTE — ED Notes (Signed)
Patricia Wagner - daughter - [phone]

## 2018-08-05 NOTE — H&P (Signed)
History and Physical    Patricia Wagner HCW:[id number] DOB: 1951-03-15 DOA: 08/05/2018  PCP: Biagio Borg, MD   Patient coming from: Home   Chief Complaint: Facial droop, slurred speech  HPI: Patricia Wagner is a 67 y.o. female with medical history significant for paroxysmal atrial fibrillation on Eliquis, chronic combined systolic and diastolic CHF, coronary artery disease, chronic kidney disease stage IV, and hypertension, now presenting to the emergency department for evaluation of slurred speech and facial droop.  Patient's family was with her initially in the ED and provided most of the history.  They reported noting a right facial droop approximately 1 week ago that has seemed to wax and wane.  They also report slurred speech and lethargy over the same interval.  They report that she has had similar symptoms previously when she is sick.  There was no fall or trauma reported and the patient has no other expressed any specific complaints.  On review of EMR, she appears to have been at cardiac rehab on 08/03/2018 with no notation made of slurred speech, facial droop, or lethargy.  ED Course: Upon arrival to the ED, patient is found to be afebrile, saturating 77% on room air, mildly tachypneic, and with vitals otherwise stable.  EKG features a sinus rhythm with first-degree AV nodal block, RBBB, and LP FB.  Noncontrast head CT is negative for acute findings.  Chest x-ray is notable for stable cardiomegaly, vascular congestion, and likely mild CHF.  Chemistry panel is notable for a newly elevated bilirubin of 4.9, mild elevation in transaminases, and creatinine of 3.79, up from 2.37 in July.  Troponin is elevated to 0.16.  Patient was placed on supplemental oxygen in the emergency department with improvement in her saturations.  She remains severely dysarthric and is drooling.  She will be admitted for ongoing evaluation and management.  Review of Systems:  Unable to complete ROS secondary to the patient's  clinical condition.  Past Medical History:  Diagnosis Date  . Acute blood loss anemia 03/05/2018  . Acute hypercapnic respiratory failure ([place]) 04/30/2017  . Acute renal failure superimposed on chronic kidney disease ([place]) 10/20/2016  . Arthritis   . Atrial fibrillation ([place])    a. s/p multiple cardioversions; failed tikosyn/sotalol.  . Bradycardia 11/20/2016  . CAD in native artery, s/p cardiac cath with non obstructive CAD 10/24/2016  . Cerebrovascular accident (CVA) due to embolism of left cerebellar artery ([place])   . CHF (congestive heart failure) ([place])   . Chronic anticoagulation 03/15/2018  . Chronic atrial fibrillation ([place]) 03/05/2018  . Chronic systolic (congestive) heart failure ([place]) 03/05/2018  . CKD (chronic kidney disease) stage 3, GFR 30-59 ml/min (HCC)   . Coagulopathy ([place]) 03/05/2018  . DIVERTICULITIS, HX OF 07/25/2007  . Diverticulosis of colon with hemorrhage 07/22/2007   Qualifier: Diagnosis of  By: [name]    . DIVERTICULOSIS, COLON 07/22/2007  . Dyspnea   . Dysuria 07/21/2017  . Edema, peripheral    a. chronic BLE edema, R>L. Prior trauma from dog attack and accident.  . Essential hypertension 07/22/2007   Qualifier: Diagnosis of  By: [name]    . HLD (hyperlipidemia) 02/03/2008   Qualifier: Diagnosis of  By: [name], [name]   . HYPERLIPIDEMIA 02/03/2008  . Hypersomnia    declines w/u  . Hypertension   . Hypotension (arterial) 04/30/2017  . MENOPAUSAL DISORDER 01/09/2011  . Morbid obesity ([place]) 07/22/2007  . NICM (nonischemic cardiomyopathy) ([place]) 10/24/2016  . PAF (paroxysmal atrial fibrillation) (  [place])   . Raynaud's syndrome 07/22/2007  . Stroke ([place]) 2017  . Suspected sleep apnea 05/20/2017  . THYROID NODULE, RIGHT 01/04/2010  . VITAMIN D DEFICIENCY 01/09/2011   Qualifier: Diagnosis of  By: [name], [name]     Past Surgical History:  Procedure Laterality Date  . CARDIAC CATHETERIZATION N/A 10/23/2016   Procedure: Left Heart Cath and Coronary  Angiography;  Surgeon: [name], MD;  Location: [facility];  Service: Cardiovascular;  Laterality: N/A;  . CARDIOVERSION N/A 10/31/2016   Procedure: CARDIOVERSION;  Surgeon: [name], MD;  Location: [facility];  Service: Cardiovascular;  Laterality: N/A;  . CARDIOVERSION N/A 11/03/2016   Procedure: CARDIOVERSION;  Surgeon: [name], MD;  Location: [facility];  Service: Cardiovascular;  Laterality: N/A;  . CARDIOVERSION N/A 11/18/2016   Procedure: CARDIOVERSION;  Surgeon: [name], MD;  Location: [facility];  Service: Cardiovascular;  Laterality: N/A;  . CARDIOVERSION N/A 03/25/2017   Procedure: CARDIOVERSION;  Surgeon: [name], MD;  Location[facility] ENDOSCOPY;  Service: Cardiovascular;  Laterality: N/A;  . CARDIOVERSION N/A 05/06/2017   Procedure: CARDIOVERSION;  Surgeon: [name], MD;  Location: [facility] ENDOSCOPY;  Service: Cardiovascular;  Laterality: N/A;  . CARDIOVERSION N/A 05/12/2017   Procedure: CARDIOVERSION;  Surgeon: [name], MD;  Location: [facility] ENDOSCOPY;  Service: Cardiovascular;  Laterality: N/A;  . COLONOSCOPY N/A 12/04/2017   Procedure: COLONOSCOPY;  Surgeon: [name], MD;  Location: [facility];  Service: Endoscopy;  Laterality: N/A;  . COLONOSCOPY W/ POLYPECTOMY  02/2011   pan diverticulosis.  tubular adenoma without dysplasia on 5 mm sigmoid polyp.  Dr Fuller Plan.    Marland Kitchen PARTIAL HYSTERECTOMY     1 OVARY LEFT  . TEE WITHOUT CARDIOVERSION N/A 10/31/2016   Procedure: TRANSESOPHAGEAL ECHOCARDIOGRAM (TEE);  Surgeon: [name], MD;  Location: [facility] ENDOSCOPY;  Service: Cardiovascular;  Laterality: N/A;  . TEE WITHOUT CARDIOVERSION N/A 03/25/2017   Procedure: TRANSESOPHAGEAL ECHOCARDIOGRAM (TEE);  Surgeon: [name], MD;  Location: [facility] ENDOSCOPY;  Service: Cardiovascular;  Laterality: N/A;     reports that she has quit smoking. Her smoking use included cigarettes. She started smoking about 8 months ago. She smoked 0.50  packs per day. She has never used smokeless tobacco. She reports that she does not drink alcohol or use drugs.  Allergies  Allergen Reactions  . Ace Inhibitors Palpitations    Family History  Problem Relation Age of Onset  . Asthma Mother   . Diabetes Father   . Heart disease Father        Died of presumed heart attack - 20s  . Lung disease Sister   . Heart disease Sister        Twin sister has heart issue, unclear what kind  . Thyroid disease Neg Hx      Prior to Admission medications   Medication Sig Start Date End Date Taking? Authorizing Provider  amiodarone (PACERONE) 200 MG tablet Take 1 tablet (200 mg total) by mouth daily. 03/11/18   Amin, Jeanella Flattery, MD  apixaban (ELIQUIS) 5 MG TABS tablet Take 1 tablet (5 mg total) by mouth 2 (two) times daily. 04/15/18   Georgiana Shore, NP  carvedilol (COREG) 3.125 MG tablet TAKE 1 TABLET (3.125 MG TOTAL) BY MOUTH 2 (TWO) TIMES DAILY. 07/02/18 09/30/18  [name], MD  epoetin alfa (EPOGEN,PROCRIT) 94854 UNIT/ML injection Inject 5,000 Units into the skin once a week.    [provider]  furosemide (  LASIX) 20 MG tablet Take 2 tablets (40 mg total) by mouth daily. 06/24/18   Bensimhon, Shaune Pascal, MD  hydrALAZINE (APRESOLINE) 50 MG tablet Take 1 tablet (50 mg total) by mouth 3 (three) times daily. 04/15/18 07/15/18  Georgiana Shore, NP  Investigational - Study Medication Take 1 tablet by mouth 2 (two) times daily. Study name: Galactic HF Study  Additional study details: Omecamtiv Mecarbil or Placebo 03/18/18   Larey Dresser, MD  isosorbide mononitrate (IMDUR) 60 MG 24 hr tablet Take 1 tablet (60 mg total) by mouth daily. 04/15/18 08/05/18  Georgiana Shore, NP  metolazone (ZAROXOLYN) 2.5 MG tablet Take 1 tablet (2.5 mg total) by mouth as needed (swelling/ wt gain). Take 1 Tablet as directed by the [facility]F Clinic 05/25/18   Clegg, Amy D, NP  potassium chloride SA (K-DUR,KLOR-CON) 20 MEQ tablet Take 1 tablet (20 mEq total) by mouth daily. Take  4 tabs first day, then take 2 tabs daily 06/15/18   Shirley Friar, PA-C  ranolazine (RANEXA) 500 MG 12 hr tablet TAKE 1 TABLET BY MOUTH TWICE A DAY 03/16/18   Bensimhon, Shaune Pascal, MD  rosuvastatin (CRESTOR) 10 MG tablet Take 10 mg by mouth daily.    [provider]    Physical Exam: Vitals:   08/05/18 2215 08/05/18 2245 08/05/18 2300 08/05/18 2315  BP: 127/78 136/75 139/80 (!) 145/80  Pulse:  (!) 57 (!) 56 (!) 57  Resp:  16 14 14   Temp:      TempSrc:      SpO2:  97% 97% 98%      Constitutional: Mild tachypnea, lethargic, no pallor, no diaphoresis Eyes: PERTLA, lids and conjunctivae normal ENMT: Mucous membranes are moist. Posterior pharynx clear of any exudate or lesions.   Neck: normal, supple, no masses, no thyromegaly Respiratory: Mild tachypnea, diffuse rales. No accessory muscle use.  Cardiovascular: S1 & S2 heard, regular rate and rhythm. No extremity edema. 3+ pretibial edema bilaterally. Abdomen: No distension, no tenderness, soft. Bowel sounds normal.  Musculoskeletal: no clubbing / cyanosis. No joint deformity upper and lower extremities.    Skin: no significant rashes, lesions, ulcers. Warm, dry, well-perfused. Neurologic: marked dysarthria, drooling, but no gross facial droop. Sensation intact. Global weakness.  Psychiatric: Lethargic, incomprehensible speech.     Labs on Admission: I have personally reviewed following labs and imaging studies  CBC: Recent Labs  Lab 08/03/18 1302 08/05/18 2047 08/05/18 2115  WBC  --  5.6  --   NEUTROABS  --  4.3  --   HGB 11.2* 11.4* 15.0  HCT  --  40.5 44.0  MCV  --  83.0  --   PLT  --  194  --    Basic Metabolic Panel: Recent Labs  Lab 08/05/18 2047 08/05/18 2115  NA 139 137  K 3.9 3.7  CL 101 102  CO2 23  --   GLUCOSE 78 72  BUN 65* 62*  CREATININE 3.79* 3.70*  CALCIUM 9.3  --    GFR: Estimated Creatinine Clearance: 18.5 mL/min (A) (by C-G formula based on SCr of 3.7 mg/dL (H)). Liver  Function Tests: Recent Labs  Lab 08/05/18 2047  AST 58*  ALT 92*  ALKPHOS 174*  BILITOT 4.9*  PROT 6.6  ALBUMIN 3.5   No results for input(s): LIPASE, AMYLASE in the last 168 hours. No results for input(s): AMMONIA in the last 168 hours. Coagulation Profile: Recent Labs  Lab 08/05/18 2047  INR 2.46   Cardiac Enzymes:  No results for input(s): CKTOTAL, CKMB, CKMBINDEX, TROPONINI in the last 168 hours. BNP (last 3 results) No results for input(s): PROBNP in the last 8760 hours. HbA1C: No results for input(s): HGBA1C in the last 72 hours. CBG: No results for input(s): GLUCAP in the last 168 hours. Lipid Profile: No results for input(s): CHOL, HDL, LDLCALC, TRIG, CHOLHDL, LDLDIRECT in the last 72 hours. Thyroid Function Tests: No results for input(s): TSH, T4TOTAL, FREET4, T3FREE, THYROIDAB in the last 72 hours. Anemia Panel: No results for input(s): VITAMINB12, FOLATE, FERRITIN, TIBC, IRON, RETICCTPCT in the last 72 hours. Urine analysis:    Component Value Date/Time   COLORURINE YELLOW 03/23/2018 1603   APPEARANCEUR CLOUDY (A) 03/23/2018 1603   LABSPEC 1.025 03/23/2018 1603   PHURINE 7.5 03/23/2018 1603   GLUCOSEU NEGATIVE 03/23/2018 1603   HGBUR TRACE-INTACT (A) 03/23/2018 1603   BILIRUBINUR SMALL (A) 03/23/2018 1603   KETONESUR TRACE (A) 03/23/2018 1603   PROTEINUR NEGATIVE 03/06/2018 0029   UROBILINOGEN 0.2 03/23/2018 1603   NITRITE NEGATIVE 03/23/2018 1603   LEUKOCYTESUR MODERATE (A) 03/23/2018 1603   Sepsis Labs: @LABRCNTIP (procalcitonin:4,lacticidven:4) )No results found for this or any previous visit (from the past 240 hour(s)).   Radiological Exams on Admission: Dg Chest 2 View  Result Date: 08/05/2018 CLINICAL DATA:  Slurred speech, facial droop. EXAM: CHEST - 2 VIEW COMPARISON:  Chest x-rays dated 03/05/2018 and 03/30/2017. FINDINGS: Stable cardiomegaly. Chronic central pulmonary vascular congestion. No new confluent opacity to suggest a developing  pneumonia. N questionable trace pleural effusion at the posterior costophrenic angles. No large pleural effusion. No pneumothorax. Osseous structures are unremarkable. IMPRESSION: 1. Stable cardiomegaly. Chronic central pulmonary vascular congestion, consistent with chronic mild CHF. 2. Questionable small pleural effusion at the posterior costophrenic angle. No evidence of pneumonia or overt alveolar pulmonary edema. Electronically Signed   By: Franki Cabot M.D.   On: 08/05/2018 21:58   Ct Head Wo Contrast  Result Date: 08/05/2018 CLINICAL DATA:  Slurred speech. Decreased grip strength. Right facial droop. Symptoms for 1 week. EXAM: CT HEAD WITHOUT CONTRAST TECHNIQUE: Contiguous axial images were obtained from the base of the skull through the vertex without intravenous contrast. COMPARISON:  12/29/2017 FINDINGS: Brain: Encephalomalacia in the left cerebellar hemisphere is stable. There is encephalomalacia in the right posterior parietal lobe which is stable. There is no mass effect, midline shift, or acute hemorrhage. Vascular: No hyperdense vessel or unexpected calcification. Skull: Intact. Sinuses/Orbits: There is scattered fluid in the bilateral mastoid air cells. This is a new finding compared to the prior study. Visualized paranasal sinuses are clear. Unremarkable orbits. 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 IMPRESSION: No acute intracranial pathology. There is fluid in the bilateral mastoid air cells Chronic changes are noted. Electronically Signed   By: Marybelle Killings M.D.   On: 08/05/2018 21:49    EKG: Independently reviewed. Sinus rhythm, 1st degree AV block, RBBB, LPFB.   Assessment/Plan  1. Acute on chronic combined systolic & diastolic CHF; acute hypoxic respiratory failure  - Presents for eval of lethargy, facial droop, and dysarthria, and is found to be saturating in mid-70's on rm air  - There is no fever or leukocytosis, CXR and exam consistent with CHF  - Continue cardiac  monitoring, diurese with Lasix 40 mg IV q12h, SLIV, follow daily wt and I/O's, continue Coreg as tolerated, continue supplemental O2 as needed    2. Dysarthria; lethargy  - Presents for eval of slurred speech, facial droop and lethargy that family reports has been  present for ~1 week  - She is drooling, speech incomprehensible, lethargic, but no gross facial droop appreciated  - Head CT is negative for acute findings  - Keep NPO pending SLP eval, check MRI brain, check UA, continue supportive care    3. Acute kidney injury superimposed on CKD IV  - SCr is 3.79 on admission, up from 2.37 in July  - She appears hypervolemic - Check renal US and urine chemistries  - Diurese and follow daily chem panel   4. Hyperbilirubinemia  - Total bilirubin is 4.9 on admission, previously normal  - There is also mild elevation in transaminases  - RUQ is not particularly tender  - Check RUQ [place], repeat CMP in am   5. Elevated troponin; CAD  - No chest pain reported  - Troponin elevated to 0.16 on admission  - CXR suggests CHF, EKG with SR and no acute ST abnormality appreciated  - She has cath in November 2017 with mild non-obstructive CAD  - Continue cardiac monitoring, trend troponin, continue beta-blocker, Imdur, and Ranexa  - Hold statin initially in light of new LFT abnormalities    6. Paroxysmal atrial fibrillation  - In sinus rhythm on admission  - CHADS-VASc 7 (gender, age, CHF, CAD, CVA x2, HTN)  - Continue Eliquis and amiodarone    DVT prophylaxis: Eliquis Code Status: Full  Family Communication: Discussed with patient  Consults called: None Admission status: Inpatient     Vianne Bulls, MD [facility] Pager [phone]  If 7PM-7AM, please contact night-coverage [email or web address] Password [facility]  08/05/2018, 11:43 PM

## 2018-08-05 NOTE — ED Triage Notes (Signed)
Pt presents to ER with slurred speech, decreased grip strength in R hand, R facial droop that family states has been ongoing x 1 wk ; pt denies headache, vision changes, numbness O2 sats in triage 77% on room air, not dependent on O2 normally

## 2018-08-06 ENCOUNTER — Inpatient Hospital Stay (HOSPITAL_COMMUNITY): Payer: Medicare HMO

## 2018-08-06 DIAGNOSIS — J9621 Acute and chronic respiratory failure with hypoxia: Secondary | ICD-10-CM

## 2018-08-06 DIAGNOSIS — R570 Cardiogenic shock: Secondary | ICD-10-CM

## 2018-08-06 DIAGNOSIS — J9601 Acute respiratory failure with hypoxia: Secondary | ICD-10-CM

## 2018-08-06 DIAGNOSIS — J810 Acute pulmonary edema: Secondary | ICD-10-CM

## 2018-08-06 DIAGNOSIS — I5043 Acute on chronic combined systolic (congestive) and diastolic (congestive) heart failure: Secondary | ICD-10-CM

## 2018-08-06 LAB — URINALYSIS, ROUTINE W REFLEX MICROSCOPIC
Bilirubin Urine: NEGATIVE
GLUCOSE, UA: NEGATIVE mg/dL
Ketones, ur: NEGATIVE mg/dL
Leukocytes, UA: NEGATIVE
NITRITE: NEGATIVE
PH: 6 (ref 5.0–8.0)
PROTEIN: 30 mg/dL — AB
RBC / HPF: >50 RBC/hpf — ABNORMAL HIGH (ref 0–5)
SPECIFIC GRAVITY, URINE: 1.005 (ref 1.005–1.030)

## 2018-08-06 LAB — HEPARIN LEVEL (UNFRACTIONATED)

## 2018-08-06 LAB — POCT I-STAT 3, ART BLOOD GAS (G3+)
ACID-BASE EXCESS: 3 mmol/L — AB (ref 0.0–2.0)
Bicarbonate: 31.6 mmol/L — ABNORMAL HIGH (ref 20.0–28.0)
O2 SAT: 97 %
PO2 ART: 102 mmHg (ref 83.0–108.0)
Patient temperature: 98.5
TCO2: 33 mmol/L — AB (ref 22–32)
pCO2 arterial: 64.4 mmHg — ABNORMAL HIGH (ref 32.0–48.0)
pH, Arterial: 7.298 — ABNORMAL LOW (ref 7.350–7.450)

## 2018-08-06 LAB — MAGNESIUM
Magnesium: 2.6 mg/dL — ABNORMAL HIGH (ref 1.7–2.4)
Magnesium: 2.6 mg/dL — ABNORMAL HIGH (ref 1.7–2.4)

## 2018-08-06 LAB — GLUCOSE, CAPILLARY
GLUCOSE-CAPILLARY: 89 mg/dL (ref 70–99)
Glucose-Capillary: 104 mg/dL — ABNORMAL HIGH (ref 70–99)
Glucose-Capillary: 123 mg/dL — ABNORMAL HIGH (ref 70–99)
Glucose-Capillary: 61 mg/dL — ABNORMAL LOW (ref 70–99)
Glucose-Capillary: 78 mg/dL (ref 70–99)
Glucose-Capillary: 94 mg/dL (ref 70–99)

## 2018-08-06 LAB — APTT
aPTT: 32 seconds (ref 24–36)
aPTT: 31 seconds (ref 24–36)

## 2018-08-06 LAB — LACTIC ACID, PLASMA: Lactic Acid, Venous: 0.8 mmol/L (ref 0.5–1.9)

## 2018-08-06 LAB — BLOOD GAS, ARTERIAL
ACID-BASE EXCESS: 0.8 mmol/L (ref 0.0–2.0)
Bicarbonate: 27.6 mmol/L (ref 20.0–28.0)
DRAWN BY: 275531
FIO2: 1
O2 SAT: 98.3 %
PATIENT TEMPERATURE: 98.6
PCO2 ART: 67.5 mmHg — AB (ref 32.0–48.0)
pH, Arterial: 7.235 — ABNORMAL LOW (ref 7.350–7.450)
pO2, Arterial: 136 mmHg — ABNORMAL HIGH (ref 83.0–108.0)

## 2018-08-06 LAB — LIPID PANEL
CHOL/HDL RATIO: 2.7 ratio
CHOLESTEROL: 93 mg/dL (ref 0–200)
HDL: 34 mg/dL — ABNORMAL LOW (ref >40)
LDL CALC: 43 mg/dL (ref 0–99)
TRIGLYCERIDES: 78 mg/dL (ref <150)
VLDL: 16 mg/dL (ref 0–40)

## 2018-08-06 LAB — COMPREHENSIVE METABOLIC PANEL
ALT: 84 U/L — ABNORMAL HIGH (ref 0–44)
ANION GAP: 12 (ref 5–15)
AST: 51 U/L — ABNORMAL HIGH (ref 15–41)
Albumin: 3.4 g/dL — ABNORMAL LOW (ref 3.5–5.0)
Alkaline Phosphatase: 172 U/L — ABNORMAL HIGH (ref 38–126)
BILIRUBIN TOTAL: 3.7 mg/dL — AB (ref 0.3–1.2)
BUN: 64 mg/dL — ABNORMAL HIGH (ref 8–23)
CHLORIDE: 102 mmol/L (ref 98–111)
CO2: 27 mmol/L (ref 22–32)
Calcium: 8.9 mg/dL (ref 8.9–10.3)
Creatinine, Ser: 3.89 mg/dL — ABNORMAL HIGH (ref 0.44–1.00)
GFR calc Af Amer: 13 mL/min — ABNORMAL LOW (ref >60)
GFR, EST NON AFRICAN AMERICAN: 11 mL/min — AB (ref >60)
Glucose, Bld: 74 mg/dL (ref 70–99)
POTASSIUM: 3.5 mmol/L (ref 3.5–5.1)
Sodium: 141 mmol/L (ref 135–145)
TOTAL PROTEIN: 6.4 g/dL — AB (ref 6.5–8.1)

## 2018-08-06 LAB — TROPONIN I
Troponin I: 0.13 ng/mL (ref <0.03)
Troponin I: 0.16 ng/mL (ref <0.03)
Troponin I: 0.16 ng/mL (ref <0.03)

## 2018-08-06 LAB — HEMOGLOBIN A1C
Hgb A1c MFr Bld: 6.4 % — ABNORMAL HIGH (ref 4.8–5.6)
Mean Plasma Glucose: 136.98 mg/dL

## 2018-08-06 LAB — COOXEMETRY PANEL
CARBOXYHEMOGLOBIN: 2.7 % — AB (ref 0.5–1.5)
METHEMOGLOBIN: 1.7 % — AB (ref 0.0–1.5)
O2 SAT: 72 %
Total hemoglobin: 11.1 g/dL — ABNORMAL LOW (ref 12.0–16.0)

## 2018-08-06 LAB — HIV ANTIBODY (ROUTINE TESTING W REFLEX): HIV Screen 4th Generation wRfx: NONREACTIVE

## 2018-08-06 LAB — PHOSPHORUS
PHOSPHORUS: 4.6 mg/dL (ref 2.5–4.6)
PHOSPHORUS: 4.3 mg/dL (ref 2.5–4.6)

## 2018-08-06 LAB — LIPASE, BLOOD: Lipase: 23 U/L (ref 11–51)

## 2018-08-06 LAB — MRSA PCR SCREENING: MRSA BY PCR: NEGATIVE

## 2018-08-06 LAB — SODIUM, URINE, RANDOM: SODIUM UR: 22 mmol/L

## 2018-08-06 LAB — CREATININE, URINE, RANDOM: CREATININE, URINE: 105.26 mg/dL

## 2018-08-06 LAB — STREP PNEUMONIAE URINARY ANTIGEN: STREP PNEUMO URINARY ANTIGEN: NEGATIVE

## 2018-08-06 MED ORDER — DOCUSATE SODIUM 50 MG/5ML PO LIQD: 100.0000 mg | Freq: Two times a day (BID) | ORAL | Status: DC | PRN

## 2018-08-06 MED ORDER — ETOMIDATE 2 MG/ML IV SOLN
20.0000 mg | Freq: Once | INTRAVENOUS | Status: AC
  Administered 2018-08-06: 20 mg via INTRAVENOUS

## 2018-08-06 MED ORDER — FENTANYL CITRATE (PF) 100 MCG/2ML IJ SOLN
100.0000 ug | INTRAMUSCULAR | Status: AC | PRN
  Administered 2018-08-06 (×3): 100 ug via INTRAVENOUS
  Filled 2018-08-06 (×3): qty 2

## 2018-08-06 MED ORDER — CHLORHEXIDINE GLUCONATE 0.12 % MT SOLN: 15.0000 mL | Freq: Two times a day (BID) | OROMUCOSAL | Status: DC

## 2018-08-06 MED ORDER — ALBUTEROL SULFATE (2.5 MG/3ML) 0.083% IN NEBU
INHALATION_SOLUTION | RESPIRATORY_TRACT | Status: AC
  Filled 2018-08-06: qty 3

## 2018-08-06 MED ORDER — DEXTROSE 50 % IV SOLN
INTRAVENOUS | Status: AC
  Administered 2018-08-06: 50 mL via INTRAVENOUS
  Filled 2018-08-06: qty 50

## 2018-08-06 MED ORDER — MIDAZOLAM HCL 2 MG/2ML IJ SOLN
INTRAMUSCULAR | Status: AC
  Filled 2018-08-06: qty 2

## 2018-08-06 MED ORDER — ROCURONIUM BROMIDE 50 MG/5ML IV SOLN
60.0000 mg | Freq: Once | INTRAVENOUS | Status: AC
  Administered 2018-08-06: 60 mg via INTRAVENOUS

## 2018-08-06 MED ORDER — FENTANYL CITRATE (PF) 100 MCG/2ML IJ SOLN
INTRAMUSCULAR | Status: AC
  Administered 2018-08-06: 100 ug
  Filled 2018-08-06: qty 2

## 2018-08-06 MED ORDER — IPRATROPIUM-ALBUTEROL 0.5-2.5 (3) MG/3ML IN SOLN
3.0000 mL | Freq: Once | RESPIRATORY_TRACT | Status: AC
  Administered 2018-08-06: 3 mL via RESPIRATORY_TRACT

## 2018-08-06 MED ORDER — ORAL CARE MOUTH RINSE: 15.0000 mL | Freq: Two times a day (BID) | OROMUCOSAL | Status: DC

## 2018-08-06 MED ORDER — AMIODARONE HCL IN DEXTROSE 360-4.14 MG/200ML-% IV SOLN
60.0000 mg/h | INTRAVENOUS | Status: AC
  Administered 2018-08-06: 60 mg/h via INTRAVENOUS
  Filled 2018-08-06 (×2): qty 200

## 2018-08-06 MED ORDER — ORAL CARE MOUTH RINSE
15.0000 mL | OROMUCOSAL | Status: DC
  Administered 2018-08-06 – 2018-08-07 (×7): 15 mL via OROMUCOSAL

## 2018-08-06 MED ORDER — NOREPINEPHRINE 4 MG/250ML-% IV SOLN
INTRAVENOUS | Status: AC
  Filled 2018-08-06: qty 250

## 2018-08-06 MED ORDER — VITAL AF 1.2 CAL PO LIQD
1000.0000 mL | ORAL | Status: DC
  Administered 2018-08-06: 1000 mL

## 2018-08-06 MED ORDER — FENTANYL CITRATE (PF) 100 MCG/2ML IJ SOLN
100.0000 ug | INTRAMUSCULAR | Status: DC | PRN
  Administered 2018-08-06: 100 ug via INTRAVENOUS
  Filled 2018-08-06 (×3): qty 2

## 2018-08-06 MED ORDER — AMIODARONE HCL IN DEXTROSE 360-4.14 MG/200ML-% IV SOLN
30.0000 mg/h | INTRAVENOUS | Status: DC
  Administered 2018-08-06 – 2018-08-11 (×11): 30 mg/h via INTRAVENOUS
  Administered 2018-08-11 – 2018-08-12 (×3): 60 mg/h via INTRAVENOUS
  Administered 2018-08-12 – 2018-08-14 (×5): 30 mg/h via INTRAVENOUS
  Filled 2018-08-06 (×18): qty 200

## 2018-08-06 MED ORDER — SODIUM CHLORIDE 0.9 % IV SOLN
3.0000 g | Freq: Two times a day (BID) | INTRAVENOUS | Status: DC
  Administered 2018-08-06 – 2018-08-07 (×2): 3 g via INTRAVENOUS
  Filled 2018-08-06 (×3): qty 3

## 2018-08-06 MED ORDER — NOREPINEPHRINE 16 MG/250ML-% IV SOLN
0.0000 ug/min | INTRAVENOUS | Status: DC
  Filled 2018-08-06: qty 250

## 2018-08-06 MED ORDER — PRO-STAT SUGAR FREE PO LIQD
30.0000 mL | Freq: Three times a day (TID) | ORAL | Status: DC
  Administered 2018-08-06 – 2018-08-07 (×3): 30 mL
  Filled 2018-08-06 (×3): qty 30

## 2018-08-06 MED ORDER — CHLORHEXIDINE GLUCONATE 0.12% ORAL RINSE (MEDLINE KIT)
15.0000 mL | Freq: Two times a day (BID) | OROMUCOSAL | Status: DC
  Administered 2018-08-06 – 2018-08-07 (×2): 15 mL via OROMUCOSAL

## 2018-08-06 MED ORDER — VITAL HIGH PROTEIN PO LIQD: 1000.0000 mL | ORAL | Status: DC

## 2018-08-06 MED ORDER — MIDAZOLAM HCL 2 MG/2ML IJ SOLN
INTRAMUSCULAR | Status: AC
  Administered 2018-08-06: 2 mg
  Filled 2018-08-06: qty 2

## 2018-08-06 MED ORDER — PRO-STAT SUGAR FREE PO LIQD: 30.0000 mL | Freq: Two times a day (BID) | ORAL | Status: DC

## 2018-08-06 MED ORDER — AMIODARONE LOAD VIA INFUSION
150.0000 mg | Freq: Once | INTRAVENOUS | Status: DC
  Filled 2018-08-06: qty 83.34

## 2018-08-06 MED ORDER — FAMOTIDINE IN NACL 20-0.9 MG/50ML-% IV SOLN
20.0000 mg | INTRAVENOUS | Status: DC
  Administered 2018-08-06: 20 mg via INTRAVENOUS
  Filled 2018-08-06: qty 50

## 2018-08-06 MED ORDER — DEXTROSE 50 % IV SOLN
1.0000 | Freq: Once | INTRAVENOUS | Status: AC
  Administered 2018-08-06: 50 mL via INTRAVENOUS

## 2018-08-06 MED ORDER — HEPARIN (PORCINE) IN NACL 100-0.45 UNIT/ML-% IJ SOLN
1300.0000 [IU]/h | INTRAMUSCULAR | Status: DC
  Administered 2018-08-06: 1000 [IU]/h via INTRAVENOUS
  Administered 2018-08-07: 1500 [IU]/h via INTRAVENOUS
  Administered 2018-08-08 – 2018-08-11 (×5): 1300 [IU]/h via INTRAVENOUS
  Filled 2018-08-06 (×7): qty 250

## 2018-08-06 NOTE — Procedures (Signed)
Intubation Procedure Note Patricia Wagner [id number] April 28, 1951  Procedure: Intubation Indications: Respiratory insufficiency  Procedure Details Consent: Risks of procedure as well as the alternatives and risks of each were explained to the (patient/caregiver).  Consent for procedure obtained. Time Out: Verified patient identification, verified procedure, site/side was marked, verified correct patient position, special equipment/implants available, medications/allergies/relevent history reviewed, required imaging and test results available.  Performed  Maximum sterile technique was used including gloves, hand hygiene and mask.  MAC    Evaluation Hemodynamic Status: BP stable throughout; O2 sats: stable throughout Patient's Current Condition: stable Complications: No apparent complications Patient did tolerate procedure well. Chest X-ray ordered to verify placement.  CXR: pending.   Jennet Maduro 08/06/2018

## 2018-08-06 NOTE — Progress Notes (Signed)
Pharmacy Antibiotic Note  Patricia Wagner is a 67 y.o. female admitted on 08/05/2018 with aspiration PNA.  Pharmacy has been consulted for Unasyn dosing.  Plan: Start Unasyn 3g IV q 12 hrs.  Watch renal function and adjust as needed F/u cultures, clinical course.  Height: 5\' 6"  (167.6 cm) Weight: 236 lb 5.3 oz (107.2 kg) IBW/kg (Calculated) : 59.3  Temp (24hrs), Avg:98.5 F (36.9 C), Min:98.2 F (36.8 C), Max:98.7 F (37.1 C)  Recent Labs  Lab 08/05/18 2047 08/05/18 2115 08/06/18 0614  WBC 5.6  --   --   CREATININE 3.79* 3.70* 3.89*    Estimated Creatinine Clearance: 17.9 mL/min (A) (by C-G formula based on SCr of 3.89 mg/dL (H)).    Allergies  Allergen Reactions  . Ace Inhibitors Palpitations    Antimicrobials this admission: Unasyn 9/13 >   Dose adjustments this admission:  Microbiology results: 9/13 Resp Cx:  9/13 MRSA PCR: neg  Thank you for allowing pharmacy to be a part of this patient's care.  [name], [facility] Clinical Pharmacist Phone [phone]  08/06/2018 12:58 PM

## 2018-08-06 NOTE — Progress Notes (Addendum)
Initial Nutrition Assessment  DOCUMENTATION CODES:   Obesity unspecified  INTERVENTION:    Vital AF 1.2 at 40 ml/h (960 ml per day)  Pro-stat 30 ml TID  Provides 1452 kcal, 117 gm protein, 779 ml free water daily  NUTRITION DIAGNOSIS:   Inadequate oral intake related to inability to eat as evidenced by NPO status.  GOAL:   Provide needs based on ASPEN/SCCM guidelines  MONITOR:   Vent status, TF tolerance, Labs, I & O's  REASON FOR ASSESSMENT:   Ventilator, Consult Enteral/tube feeding initiation and management  ASSESSMENT:   67 yo female with PMH of NICM (EF 15%), HTN, HLD, morbid obesity, CHF, CAD, CKD, and CVA who was admitted on 9/12 with biventricular HF, hypoxic respiratory failure. Developed A fib, transferred to MICU and intubated 9/13.  Received MD Consult for TF initiation and management. OG tube in place.  Patient is currently intubated on ventilator support MV: 6.9 L/min Temp (24hrs), Avg:98.5 F (36.9 C), Min:98.2 F (36.8 C), Max:98.7 F (37.1 C)   Labs reviewed. Medications reviewed and include Lasix for diuresis.  I/O -4 L since admission No significant weight changes noted on review of weight encounters for the past 4 months.  NUTRITION - FOCUSED PHYSICAL EXAM:    Most Recent Value  Orbital Region  No depletion  Upper Arm Region  No depletion  Thoracic and Lumbar Region  Unable to assess  Buccal Region  No depletion  Temple Region  No depletion  Clavicle Bone Region  No depletion  Clavicle and Acromion Bone Region  No depletion  Scapular Bone Region  No depletion  Dorsal Hand  No depletion  Patellar Region  No depletion  Anterior Thigh Region  No depletion  Posterior Calf Region  No depletion  Edema (RD Assessment)  Severe  Hair  Reviewed  Eyes  Unable to assess  Mouth  Unable to assess  Skin  Reviewed  Nails  Reviewed       Diet Order:   Diet Order            Diet NPO time specified  Diet effective now               EDUCATION NEEDS:   No education needs have been identified at this time  Skin:  Skin Assessment: Reviewed RN Assessment  Last BM:  9/13  Height:   Ht Readings from Last 1 Encounters:  08/06/18 5\' 6"  (1.676 m)    Weight:   Wt Readings from Last 1 Encounters:  08/06/18 107.2 kg    Ideal Body Weight:  59.1 kg  BMI:  Body mass index is 38.15 kg/m.  Estimated Nutritional Needs:   Kcal:  1175-1500  Protein:  118 gm  Fluid:  >/= 1.8 L    Molli Barrows, RD, LDN, [place] Pager [phone] After Hours Pager [phone]

## 2018-08-06 NOTE — Procedures (Deleted)
Electrical Cardioversion Procedure Note FATIMATA TALSMA [id number] 08/05/1951  Procedure: Electrical Cardioversion Indications:  Atrial Fibrillation  Procedure Details Consent: Risks of procedure as well as the alternatives and risks of each were explained to the (patient/caregiver).  Consent for procedure obtained. Time Out: Verified patient identification, verified procedure, site/side was marked, verified correct patient position, special equipment/implants available, medications/allergies/relevent history reviewed, required imaging and test results available.  Performed  Patient placed on cardiac monitor, pulse oximetry, supplemental oxygen as necessary.  Sedation given: Propofol per anesthesiology Pacer pads placed anterior and posterior chest.  Cardioverted 1 time(s).  Cardioverted at [place].  Evaluation Findings: Post procedure EKG shows: NSR Complications: None Patient did tolerate procedure well.   Loralie Champagne 08/06/2018, 9:27 AM

## 2018-08-06 NOTE — Procedures (Signed)
Central Venous Catheter Insertion Procedure Note DAIRA HINE [id number] 07-16-1951  Procedure: Insertion of Central Venous Catheter Indications: Assessment of intravascular volume, Drug and/or fluid administration and Frequent blood sampling  Procedure Details Consent: Risks of procedure as well as the alternatives and risks of each were explained to the (patient/caregiver).  Consent for procedure obtained. Time Out: Verified patient identification, verified procedure, site/side was marked, verified correct patient position, special equipment/implants available, medications/allergies/relevent history reviewed, required imaging and test results available.  Performed  Maximum sterile technique was used including antiseptics, cap, gloves, gown, hand hygiene, mask and sheet. Skin prep: Chlorhexidine; local anesthetic administered A antimicrobial bonded/coated triple lumen catheter was placed in the right internal jugular vein using the Seldinger technique.  Evaluation Blood flow good Complications: No apparent complications Patient did tolerate procedure well. Chest X-ray ordered to verify placement.  CXR: pending.  U/S used in placement  YACOUB,WESAM 08/06/2018, 12:03 PM

## 2018-08-06 NOTE — Care Management Note (Addendum)
Case Management Note  Patient Details  Name: Patricia Wagner MRN: [id number] Date of Birth: 07-30-1951  Subjective/Objective:      Pt admitted with slurred speech and facial droop              Action/Plan:  PTA from home.  CM attempted to speak with pt however pt unable to communicate (on non-rebreather likely will transition to BIPAP soon).  CM will follow back up with pt at a later time   Expected Discharge Date:                  Expected Discharge Plan:     In-House Referral:  Clinical Social Work  Discharge planning Services  CM Consult  Post Acute Care Choice:    Choice offered to:     DME Arranged:    DME Agency:     HH Arranged:    [place] Agency:     Status of Service:     If discussed at [employer], dates discussed:    Additional Comments: 08/06/2018 Pt transferred to ICU in preparation of intubation [name], RN 08/06/2018, 9:54 AM

## 2018-08-06 NOTE — Progress Notes (Signed)
PROGRESS NOTE  Patricia Wagner  QPR:[id number] DOB: December 28, 1950 DOA: 08/05/2018 PCP: Biagio Borg, MD   Brief Narrative: Patricia Wagner is a 67 y.o. female with a history of chronic combined HFrEF, paroxysmal AFib w/hx cardiogenic shock when in RVR, CAD, stage IV CKD, and HTN who presented to the ED with waxing/waning slurred speech lethargy, and progressive shortness of breath and associated leg swelling over 1 week. She was in sinus rhythm, tachypneic and hypoxic on arrival with reported drooling and dysarthria. Had known 1st AV block on ECG and widened QRS from previous consistent with RBBB and LPFB. Troponin 0.16, though she denied chest pain. CXR showed vascular congestion, stable cardiomegaly. CT head negative for acute findings. Creatinine up to 3.79 from 2.37 and LFTs newly mildly elevated. She was given lasix 40mg  IV and admitted for further work up on 2L O2. MRI brain showed no new findings, stable chronic right occipital and left superior cerebellar territory strokes. The following morning she appeared more lethargic and required NRB, went into AFib, ultimately required intubation and transferred to [facility] service.  Assessment & Plan: Principal Problem:   Acute on chronic combined systolic and diastolic CHF (congestive heart failure) (HCC) Active Problems:   Essential hypertension   CAD in native artery, s/p cardiac cath with non obstructive CAD   PAF (paroxysmal atrial fibrillation) (HCC)   Acute respiratory failure with hypoxia (HCC)   Acute renal failure superimposed on stage 4 chronic kidney disease (HCC)   Elevated troponin   Hyperbilirubinemia   Abnormal transaminases   Dysarthria  Acute hypoxic and hypercarbic respiratory failure: Due to acute CHF, lethargy, possibly aspiration.  - Requiring intubation 9/13. Per PCCM - Diuresis as below, consulted cardiology.   Acute on chronic HFrEF:  - Lasix 40mg  IV BID, had 1.3L out overnight.  - HF team to evaluate  Lethargy:  - Gave D50  for hypoglycemia - ABG with hypercarbia, similar to priors. Not improved w/BiPAP > ETT.  Paroxysmal AFib: Reported to have caused cardiogenic shock in the past.  - Per cardiology  Elevated bilirubin: - RUQ U/S  AKI on stage IV CKD:  - Diuresis, check renal U/S  Elevated troponin, CAD:  - Per cardiology  Subjective: Lethargic, called by RN that rapid response called. Pt oriented, very drowsy but responsive.  Objective: Vitals:   08/06/18 0351 08/06/18 0451 08/06/18 0515 08/06/18 0737  BP:  137/71 (!) 141/85 114/72  Pulse: (!) 55 60 (!) 57 (!) 56  Resp: 13 15 14 14   Temp:   98.5 F (36.9 C) 98.7 F (37.1 C)  TempSrc:   Oral Axillary  SpO2: 93% (!) 84% 96% (!) 84%  Weight:   107.2 kg   Height:   5\' 6"  (1.676 m)     Intake/Output Summary (Last 24 hours) at 08/06/2018 0847 Last data filed at 08/06/2018 0536 Gross per 24 hour  Intake -  Output 1300 ml  Net -1300 ml   Filed Weights   08/06/18 0515  Weight: 107.2 kg    Gen: Drowsy female Pulm: Non-labored breathing, expiratory wheezing bilaterally with crackles.  CV: Regular rate and rhythm initially, converted to irreg irreg around 9:00. + JVD, +hepatojugular reflux, 2+ pedal edema. GI: Abdomen soft, non-tender, non-distended, with normoactive bowel sounds. No organomegaly or masses felt. Ext: Cool, dry Skin: No rashes, lesions or ulcers Neuro: Answers orientation questions appropriately, maintaining airway, but not cooperative with full exam. Psych: UTD   Data Reviewed: I have personally reviewed following labs and imaging  studies  CBC: Recent Labs  Lab 08/03/18 1302 08/05/18 2047 08/05/18 2115  WBC  --  5.6  --   NEUTROABS  --  4.3  --   HGB 11.2* 11.4* 15.0  HCT  --  40.5 44.0  MCV  --  83.0  --   PLT  --  194  --    Basic Metabolic Panel: Recent Labs  Lab 08/05/18 2047 08/05/18 2115 08/06/18 0614  NA 139 137 141  K 3.9 3.7 3.5  CL 101 102 102  CO2 23  --  27  GLUCOSE 78 72 74  BUN 65* 62* 64*   CREATININE 3.79* 3.70* 3.89*  CALCIUM 9.3  --  8.9   GFR: Estimated Creatinine Clearance: 17.9 mL/min (A) (by C-G formula based on SCr of 3.89 mg/dL (H)). Liver Function Tests: Recent Labs  Lab 08/05/18 2047 08/06/18 0614  AST 58* 51*  ALT 92* 84*  ALKPHOS 174* 172*  BILITOT 4.9* 3.7*  PROT 6.6 6.4*  ALBUMIN 3.5 3.4*   Recent Labs  Lab 08/05/18 2356  LIPASE 23   No results for input(s): AMMONIA in the last 168 hours. Coagulation Profile: Recent Labs  Lab 08/05/18 2047  INR 2.46   Cardiac Enzymes: Recent Labs  Lab 08/05/18 2356 08/06/18 0614  TROPONINI 0.13* 0.16*   BNP (last 3 results) No results for input(s): PROBNP in the last 8760 hours. HbA1C: Recent Labs    08/06/18 0614  HGBA1C 6.4*   CBG: No results for input(s): GLUCAP in the last 168 hours. Lipid Profile: Recent Labs    08/06/18 0614  CHOL 93  HDL 34*  LDLCALC 43  TRIG 78  CHOLHDL 2.7   Thyroid Function Tests: No results for input(s): TSH, T4TOTAL, FREET4, T3FREE, THYROIDAB in the last 72 hours. Anemia Panel: No results for input(s): VITAMINB12, FOLATE, FERRITIN, TIBC, IRON, RETICCTPCT in the last 72 hours. Urine analysis:    Component Value Date/Time   COLORURINE YELLOW 03/23/2018 1603   APPEARANCEUR CLOUDY (A) 03/23/2018 1603   LABSPEC 1.025 03/23/2018 1603   PHURINE 7.5 03/23/2018 1603   GLUCOSEU NEGATIVE 03/23/2018 1603   HGBUR TRACE-INTACT (A) 03/23/2018 1603   BILIRUBINUR SMALL (A) 03/23/2018 1603   KETONESUR TRACE (A) 03/23/2018 1603   PROTEINUR NEGATIVE 03/06/2018 0029   UROBILINOGEN 0.2 03/23/2018 1603   NITRITE NEGATIVE 03/23/2018 1603   LEUKOCYTESUR MODERATE (A) 03/23/2018 1603   Recent Results (from the past 240 hour(s))  MRSA PCR Screening     Status: None   Collection Time: 08/06/18  5:26 AM  Result Value Ref Range Status   MRSA by PCR NEGATIVE NEGATIVE Final    Comment:        The GeneXpert MRSA Assay (FDA approved for NASAL specimens only), is one component  of a comprehensive MRSA colonization surveillance program. It is not intended to diagnose MRSA infection nor to guide or monitor treatment for MRSA infections. Performed at [facility], [place] [address], [place], [place] [address]       Radiology Studies: Dg Chest 2 View  Result Date: 08/05/2018 CLINICAL DATA:  Slurred speech, facial droop. EXAM: CHEST - 2 VIEW COMPARISON:  Chest x-rays dated 03/05/2018 and 03/30/2017. FINDINGS: Stable cardiomegaly. Chronic central pulmonary vascular congestion. No new confluent opacity to suggest a developing pneumonia. N questionable trace pleural effusion at the posterior costophrenic angles. No large pleural effusion. No pneumothorax. Osseous structures are unremarkable. IMPRESSION: 1. Stable cardiomegaly. Chronic central pulmonary vascular congestion, consistent with chronic mild CHF.  2. Questionable small pleural effusion at the posterior costophrenic angle. No evidence of pneumonia or overt alveolar pulmonary edema. Electronically Signed   By: Franki Cabot M.D.   On: 08/05/2018 21:58   Ct Head Wo Contrast  Result Date: 08/05/2018 CLINICAL DATA:  Slurred speech. Decreased grip strength. Right facial droop. Symptoms for 1 week. EXAM: CT HEAD WITHOUT CONTRAST TECHNIQUE: Contiguous axial images were obtained from the base of the skull through the vertex without intravenous contrast. COMPARISON:  12/29/2017 FINDINGS: Brain: Encephalomalacia in the left cerebellar hemisphere is stable. There is encephalomalacia in the right posterior parietal lobe which is stable. There is no mass effect, midline shift, or acute hemorrhage. Vascular: No hyperdense vessel or unexpected calcification. Skull: Intact. Sinuses/Orbits: There is scattered fluid in the bilateral mastoid air cells. This is a new finding compared to the prior study. Visualized paranasal sinuses are clear. Unremarkable orbits. 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 IMPRESSION: No acute intracranial  pathology. There is fluid in the bilateral mastoid air cells Chronic changes are noted. Electronically Signed   By: Marybelle Killings M.D.   On: 08/05/2018 21:49   Mr Brain Wo Contrast  Result Date: 08/06/2018 CLINICAL DATA:  67 y/o  F; slurred speech and right facial droop. EXAM: MRI HEAD WITHOUT CONTRAST TECHNIQUE: Multiplanar, multiecho pulse sequences of the brain and surrounding structures were obtained without intravenous contrast. COMPARISON:  08/05/2018 CT head.  11/20/2016 MRI head. FINDINGS: Brain: No acute infarction, hemorrhage, hydrocephalus, extra-axial collection or mass lesion. Small chronic cortical infarction within the right occipital lobe and left superior cerebellar hemisphere. Mild chronic microvascular ischemic changes of white matter and volume loss of the brain. Hemosiderin staining of the left superior cerebellar infarction. Vascular: Normal flow voids. Skull and upper cervical spine: Normal marrow signal. Sinuses/Orbits: Left anterior ethmoid mucous retention cyst. Bilateral mastoid opacification. Orbits are unremarkable. 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 IMPRESSION: 1. No acute intracranial abnormality identified. 2. Small chronic infarctions within the right occipital lobe and the left superior cerebellar hemisphere. 3. Mild chronic microvascular ischemic changes and volume loss of the brain. 4. Bilateral mastoid opacification. Electronically Signed   By: Kristine Garbe M.D.   On: 08/06/2018 03:42   US Abdomen Complete  Result Date: 08/06/2018 CLINICAL DATA:  67 y/o F; abnormal liver function tests. Chronic kidney disease stage 4. Acute renal failure. EXAM: ABDOMEN ULTRASOUND COMPLETE COMPARISON:  12/02/2017 CT abdomen and pelvis. 03/08/2018 renal ultrasound. FINDINGS: Gallbladder: Resected. Common bile duct: Diameter: 5.3 mm Liver: No focal lesion identified. Within normal limits in parenchymal echogenicity. Portal vein is  patent on color Doppler imaging with normal direction of blood flow towards the liver. IVC: No abnormality visualized. Pancreas: Poorly visualized due to bowel gas shadow. Spleen: Size and appearance within normal limits. Right Kidney: Length: 10.6 cm. Echogenicity within normal limits. No mass or hydronephrosis visualized. Left Kidney: Length: 11.0 cm. Echogenicity within normal limits. Lower pole simple cyst measuring up to 4.1 cm. Abdominal aorta: Abdominal aortic atherosclerosis with wall calcifications. Greatest diameter 2.4 cm. Other findings: Foley catheter in situ. IMPRESSION: 1. No acute process identified. 2. Stable left kidney lower pole cyst. 3.  Aortic Atherosclerosis (ICD10-I70.0). Electronically Signed   By: Kristine Garbe M.D.   On: 08/06/2018 01:16    Scheduled Meds: .  stroke: mapping our early stages of recovery book   Does not apply Once  . amiodarone  200 mg Oral Daily  . apixaban  5 mg Oral BID  .  carvedilol  3.125 mg Oral BID WC  . furosemide  40 mg Intravenous Q12H  . hydrALAZINE  50 mg Oral Q8H  . ipratropium-albuterol  3 mL Nebulization Once  . isosorbide mononitrate  60 mg Oral Daily  . ranolazine  500 mg Oral BID  . sodium chloride flush  3 mL Intravenous Q12H   Continuous Infusions: . sodium chloride       LOS: 1 day   Time spent: 35 minutes.  Patrecia Pour, MD [facility] [email or web address] Password [facility] 08/06/2018, 8:47 AM

## 2018-08-06 NOTE — Progress Notes (Signed)
ANTICOAGULATION CONSULT NOTE - Initial Consult  Pharmacy Consult for IV heparin Indication: atrial fibrillation  Allergies  Allergen Reactions  . Ace Inhibitors Palpitations    Patient Measurements: Height: 5\' 6"  (167.6 cm) Weight: 236 lb 5.3 oz (107.2 kg) IBW/kg (Calculated) : 59.3 Heparin Dosing Weight: 84 kg  Vital Signs: Temp: 98.7 F (37.1 C) (09/13 0737) Temp Source: Axillary (09/13 0737) BP: 115/76 (09/13 1100) Pulse Rate: 75 (09/13 1100)  Labs: Recent Labs    08/05/18 2047 08/05/18 2115 08/05/18 2356 08/06/18 0614  HGB 11.4* 15.0  --   --   HCT 40.5 44.0  --   --   PLT 194  --   --   --   APTT 34  --   --   --   LABPROT 26.5*  --   --   --   INR 2.46  --   --   --   CREATININE 3.79* 3.70*  --  3.89*  TROPONINI  --   --  0.13* 0.16*    Estimated Creatinine Clearance: 17.9 mL/min (A) (by C-G formula based on SCr of 3.89 mg/dL (H)).   Medical History: Past Medical History:  Diagnosis Date  . Acute blood loss anemia 03/05/2018  . Acute hypercapnic respiratory failure ([place]) 04/30/2017  . Acute renal failure superimposed on chronic kidney disease ([place]) 10/20/2016  . Arthritis   . Atrial fibrillation ([place])    a. s/p multiple cardioversions; failed tikosyn/sotalol.  . Bradycardia 11/20/2016  . CAD in native artery, s/p cardiac cath with non obstructive CAD 10/24/2016  . Cerebrovascular accident (CVA) due to embolism of left cerebellar artery ([place])   . CHF (congestive heart failure) ([place])   . Chronic anticoagulation 03/15/2018  . Chronic atrial fibrillation ([place]) 03/05/2018  . Chronic systolic (congestive) heart failure ([place]) 03/05/2018  . CKD (chronic kidney disease) stage 3, GFR 30-59 ml/min (HCC)   . Coagulopathy ([place]) 03/05/2018  . DIVERTICULITIS, HX OF 07/25/2007  . Diverticulosis of colon with hemorrhage 07/22/2007   Qualifier: Diagnosis of  By: [name]    . DIVERTICULOSIS, COLON 07/22/2007  . Dyspnea   . Dysuria 07/21/2017  . Edema, peripheral    a. chronic BLE edema, R>L. Prior trauma from dog attack and accident.  . Essential hypertension 07/22/2007   Qualifier: Diagnosis of  By: [name]    . HLD (hyperlipidemia) 02/03/2008   Qualifier: Diagnosis of  By: [name], [name]   . HYPERLIPIDEMIA 02/03/2008  . Hypersomnia    declines w/u  . Hypertension   . Hypotension (arterial) 04/30/2017  . MENOPAUSAL DISORDER 01/09/2011  . Morbid obesity ([place]) 07/22/2007  . NICM (nonischemic cardiomyopathy) ([place]) 10/24/2016  . PAF (paroxysmal atrial fibrillation) ([place])   . Raynaud's syndrome 07/22/2007  . Stroke ([place]) 2017  . Suspected sleep apnea 05/20/2017  . THYROID NODULE, RIGHT 01/04/2010  . VITAMIN D DEFICIENCY 01/09/2011   Qualifier: Diagnosis of  By: [name], [name]     Medications:  Infusions:  . sodium chloride    . amiodarone 60 mg/hr (08/06/18 1027)   Followed by  . amiodarone    . ampicillin-sulbactam (UNASYN) IV    . famotidine (PEPCID) IV    . heparin    . norepinephrine (LEVOPHED) Adult infusion    . norepinephrine      Assessment: 67 yo female on chronic Eliquis for afib.  Now in ICU, unable to take po meds.  Pharmacy asked to begin anticoagulation with IV heparin.  Unknown timing of  last Eliquis dose, but has been over 12 hrs.  Baseline INR elevated to 2.46, likely due to hepatic dysfunction?  Goal of Therapy:  Heparin level 0.3-0.7 units/ml Monitor platelets by anticoagulation protocol: Yes   Plan:  1. Check baseline aptt and heparin level. 2. Start heparin gtt at 1000 units/hr. 3. Check heparin level 8 hrs after gtt starts. 4. Daily heparin level and CBC.  [name], [facility] Clinical Pharmacist Phone [phone]  08/06/2018 1:07 PM

## 2018-08-06 NOTE — Progress Notes (Signed)
Pt arrived to floor with home foley catheter.  Urine leaking around catheter.  Urine grossly hematuria.  Order received to replace foley.  Foley replaced with 16Fr catheter using sterile technique.  Urine flow noted to foley.

## 2018-08-06 NOTE — Consult Note (Addendum)
PULMONARY / CRITICAL CARE MEDICINE   NAME:  Patricia Wagner, MRN:  [id number], DOB:  [birth year], LOS: 1 ADMISSION DATE:  08/05/2018, CONSULTATION DATE:  9/13 REFERRING MD:  Bensimohn, CHIEF COMPLAINT:  Acute respiratory failure   BRIEF HISTORY/SIGNIFICANT HOSPITAL EVENTS:    This is a 67 year old female w NICM w/ EF 15% further c/b severe RV dysfxn. Admitted on 9/12 w/ dysarthria, lethargy and working dx of acute on chronic Biventricular HF, hypoxic resp failure (sats in 70s on room air). Placed on supplemental oxygen, lasix and admitted to SDU. Was in NSR on admit  8/12 to 8/13: remained lethargic, FIo2 requirements increased. ABG obtained w/ PCO2 68, ph 7.24; CXR showing increased pulmonary edema as well as possible retrocardiac infiltrate. Placed on NIPPV. Remained lethargic. Went into AFib. PCCM asked to eval     SIGNIFICANT PAST MEDICAL HISTORY   NICM EF 15%. Severe RV dysfxn, HTN, CKD stage IV, PAF (on Eliquis)  STUDIES:   CT brain 9/12: negative for acute findings MRI brain 9/13: no acute findings. Small chronic infarcts w/in the right occipital lobe and superior cerebellar  CULTURES:  Respiratory culture 9/13>>> U strep 9/13>>>  ANTIBIOTICS:  Unasyn 9/13>>>  LINES/TUBES:  OETT 9/13>>> CONSULTANTS:  Heart failure team 9/13>>> PCCM 9/13>>>  SUBJECTIVE:  Lethargic. Unable to tell  CONSTITUTIONAL: Blood Pressure 114/72   Pulse 80   Temperature 98.7 F (37.1 C) (Axillary)   Respiration 15   Height 5\' 6"  (1.676 m)   Weight 107.2 kg   Oxygen Saturation 98%   Body Mass Index 38.15 kg/m   I/O last 3 completed shifts: In: -  Out: 1300 [Urine:1300]        PHYSICAL EXAM: General:  Lethargic 67 year old on BIPAP Neuro:  Opens eyes. Sp slurred off BIPAP moves all ext w/ generalized weakness  HEENT:  NCAT + hepatojugular reflux MMM drooling  Cardiovascular:  Irregular irreg w/ systolic murmur  Lungs:  decreaed t/o Abdomen:  Soft not tender + bowel sounds   Musculoskeletal:  Equal st and bulk but weak diffusely.  Skin:  Warm and dry LE edema noted.   RESOLVED PROBLEM LIST   ASSESSMENT AND PLAN    Acute Hypoxic and Hypercarbic Respiratory Failure in setting of acute pulmonary edema, complicated further by what looks like possible aspiration PNA -failed BIPAP -PCXR personally reviewed w/ worsening edema and now retrocardiac infiltrate.  Plan Intubate PAD protocol VAP bundle F/u abg and cxr Cont to push lasix Sputum culture unasyn day 1  Acute biventricular heart failure (seemingly more RV failure than left currently)  Mild trop I  Plan Cont lasix KVO IVFs Get SCVO2 after line placed.  CVP monitoring  Treat AF  PAF. Apparently in past PAF has exacerbated her HF Plan Tele monitoring Cont amiodarone Heparin in place of DOAC   Acute metabolic encephalopathy 2/2 hypercarbia and hypoxia +/- infection -prior CVA  Plan PAD protocol  Supportive are   CKD stage IV  Baseline: 2.37 now up to 3.89 Plan Avoid hypotension Cont lasix Serial chemistry Strict I&O  Mild elevated LFTs-->suspect hepatic congestion  Plan Trend   At risk for fluid and electrolyte imbalance Plan Trend chemistry w/ active diuresis    SUMMARY OF TODAY'S PLAN:  Transfer to the ICU, intubated, place TLC, begin active diureses and pressors for BP support.  Follow CVP and SvO2.  Check ABG and adjust vent accordingly.  CXR post intubation and line placement.  Place A-line if BP becomes questionable.  Appreciate  input from advanced heart failure team.  Monitor renal function closely.  Replace electerolytes.  PCCM will continue to follow.  Best Practice / Goals of Care / Disposition.   DVT PROPHYLAXIS: changed from eliquis to heparin on 9/13 SUP:H2B NUTRITION:NPO-->tubefeeds  MOBILITY:BR GOALS OF CARE: Full code FAMILY DISCUSSIONS: pending  DISPOSITION  ICU Glucose control: n/a Analgesia/ Sedation: RAS goal 0 to -1 per PAD protocol   LABS   Glucose Recent Labs  Lab 08/06/18 0852 08/06/18 1025  GLUCAP 61* 94    BMET Recent Labs  Lab 08/05/18 2047 08/05/18 2115 08/06/18 0614  NA 139 137 141  K 3.9 3.7 3.5  CL 101 102 102  CO2 23  --  27  BUN 65* 62* 64*  CREATININE 3.79* 3.70* 3.89*  GLUCOSE 78 72 74    Liver Enzymes Recent Labs  Lab 08/05/18 2047 08/06/18 0614  AST 58* 51*  ALT 92* 84*  ALKPHOS 174* 172*  BILITOT 4.9* 3.7*  ALBUMIN 3.5 3.4*    Electrolytes Recent Labs  Lab 08/05/18 2047 08/06/18 0614  CALCIUM 9.3 8.9    CBC Recent Labs  Lab 08/03/18 1302 08/05/18 2047 08/05/18 2115  WBC  --  5.6  --   HGB 11.2* 11.4* 15.0  HCT  --  40.5 44.0  PLT  --  194  --     ABG Recent Labs  Lab 08/06/18 0843  PHART 7.235*  PCO2ART 67.5*  PO2ART 136*    Coag's Recent Labs  Lab 08/05/18 2047  APTT 34  INR 2.46    Sepsis Markers No results for input(s): LATICACIDVEN, PROCALCITON, O2SATVEN in the last 168 hours.  Cardiac Enzymes Recent Labs  Lab 08/05/18 2356 08/06/18 0614  TROPONINI 0.13* 0.16*   HISTORY OF PRESENT ILLNESS   67 year old female w/ known h/o chronic biventricular HF (EF 15 % and severe RV dysfxn), CKD stage IV, PAF (on eliquis), prior cardiogenic shock, HTN. Admitted on 9/12 brought in by family who reported waxing/waning MS change demonstrated as: lethargy, slurred speech and progressive shortness of breath. Was hypoxic w sats in 9s on arrival. Had both CT and MRI of brain both which were negative for acute neurological changes. She was admitted to SDU setting w/ supplemental oxygen and diuretics w/ plan to consult Heart failure team. On 9/13 was more lethargic, Ph 7.26 PCO2 64. CXR w/ worsening edema and now retrocardiac infiltrate. Placed on BIPAP w/out improvement in MS. PCCM asked to eval   PAST MEDICAL HISTORY :   She  has a past medical history of Acute blood loss anemia (03/05/2018), Acute hypercapnic respiratory failure ([place]) (04/30/2017), Acute renal failure  superimposed on chronic kidney disease ([place]) (10/20/2016), Arthritis, Atrial fibrillation ([place]), Bradycardia (11/20/2016), CAD in native artery, s/p cardiac cath with non obstructive CAD (10/24/2016), Cerebrovascular accident (CVA) due to embolism of left cerebellar artery ([place]), CHF (congestive heart failure) ([place]), Chronic anticoagulation (03/15/2018), Chronic atrial fibrillation ([place]) (3/00/7622), Chronic systolic (congestive) heart failure ([place]) (03/05/2018), CKD (chronic kidney disease) stage 3, GFR 30-59 ml/min ([place]), Coagulopathy ([place]) (03/05/2018), DIVERTICULITIS, HX OF (07/25/2007), Diverticulosis of colon with hemorrhage (07/22/2007), DIVERTICULOSIS, COLON (07/22/2007), Dyspnea, Dysuria (07/21/2017), Edema, peripheral, Essential hypertension (07/22/2007), HLD (hyperlipidemia) (02/03/2008), HYPERLIPIDEMIA (02/03/2008), Hypersomnia, Hypertension, Hypotension (arterial) (04/30/2017), MENOPAUSAL DISORDER (01/09/2011), Morbid obesity ([place]) (07/22/2007), NICM (nonischemic cardiomyopathy) ([place]) (10/24/2016), PAF (paroxysmal atrial fibrillation) ([place]), Raynaud's syndrome (07/22/2007), Stroke ([place]) (2017), Suspected sleep apnea (05/20/2017), THYROID NODULE, RIGHT (01/04/2010), and VITAMIN D DEFICIENCY (01/09/2011).  PAST SURGICAL HISTORY:  She  has a  past surgical history that includes Partial hysterectomy; Cardiac catheterization (N/A, 10/23/2016); Cardioversion (N/A, 10/31/2016); TEE without cardioversion (N/A, 10/31/2016); Cardioversion (N/A, 11/03/2016); Cardioversion (N/A, 11/18/2016); Cardioversion (N/A, 03/25/2017); TEE without cardioversion (N/A, 03/25/2017); Cardioversion (N/A, 05/06/2017); Cardioversion (N/A, 05/12/2017); Colonoscopy w/ polypectomy (02/2011); and Colonoscopy (N/A, 12/04/2017).  Allergies  Allergen Reactions  . Ace Inhibitors Palpitations    No current facility-administered medications on file prior to encounter.    Current Outpatient Medications on File Prior to Encounter  Medication Sig  . amiodarone  (PACERONE) 200 MG tablet Take 1 tablet (200 mg total) by mouth daily.  Marland Kitchen apixaban (ELIQUIS) 5 MG TABS tablet Take 1 tablet (5 mg total) by mouth 2 (two) times daily.  . carvedilol (COREG) 3.125 MG tablet TAKE 1 TABLET (3.125 MG TOTAL) BY MOUTH 2 (TWO) TIMES DAILY.  Marland Kitchen epoetin alfa (EPOGEN,PROCRIT) 62947 UNIT/ML injection Inject 5,000 Units into the skin once a week.  . furosemide (LASIX) 20 MG tablet Take 2 tablets (40 mg total) by mouth daily.  . hydrALAZINE (APRESOLINE) 50 MG tablet Take 1 tablet (50 mg total) by mouth 3 (three) times daily.  . Investigational - Study Medication Take 1 tablet by mouth 2 (two) times daily. Study name: Galactic HF Study  Additional study details: Omecamtiv Mecarbil or Placebo  . isosorbide mononitrate (IMDUR) 60 MG 24 hr tablet Take 1 tablet (60 mg total) by mouth daily.  . metolazone (ZAROXOLYN) 2.5 MG tablet Take 1 tablet (2.5 mg total) by mouth as needed (swelling/ wt gain). Take 1 Tablet as directed by the [facility]  . potassium chloride SA (K-DUR,KLOR-CON) 20 MEQ tablet Take 1 tablet (20 mEq total) by mouth daily. Take 4 tabs first day, then take 2 tabs daily  . ranolazine (RANEXA) 500 MG 12 hr tablet TAKE 1 TABLET BY MOUTH TWICE A DAY  . rosuvastatin (CRESTOR) 10 MG tablet Take 10 mg by mouth daily.    FAMILY HISTORY:   Her family history includes Asthma in her mother; Diabetes in her father; Heart disease in her father and sister; Lung disease in her sister. There is no history of Thyroid disease.  SOCIAL HISTORY:  She  reports that she has quit smoking. Her smoking use included cigarettes. She started smoking about 8 months ago. She smoked 0.50 packs per day. She has never used smokeless tobacco. She reports that she does not drink alcohol or use drugs.  REVIEW OF SYSTEMS:    Unattainable.  Attending Note:  67 year old female with heart failure presenting with acute respiratory failure due to pulmonary edema and heart failure.  On exam, diffuse  crackles noted.  I reviewed CXR myself, pulmonary edema noted.  Dicussed with PCCM-NP and advanced heart failure team.  Transfer to the ICU, intubated, place TLC, begin active diureses and pressors for BP support.  Follow CVP and SvO2.  Check ABG and adjust vent accordingly.  CXR post intubation and line placement.  Place A-line if BP becomes questionable.  Appreciate input from advanced heart failure team.  Monitor renal function closely.  Replace electerolytes.  PCCM will continue to follow.  The patient is critically ill with multiple organ systems failure and requires high complexity decision making for assessment and support, frequent evaluation and titration of therapies, application of advanced monitoring technologies and extensive interpretation of multiple databases.   Critical Care Time devoted to patient care services described in this note is  45  Minutes. This time reflects time of care of this signee Dr Jennet Maduro. This critical  care time does not reflect procedure time, or teaching time or supervisory time of PA/NP/Med student/Med Resident etc but could involve care discussion time.  [name], M.D. [facility] Pulmonary/Critical Care Medicine. Pager: [phone]. After hours pager: [phone].

## 2018-08-06 NOTE — Consult Note (Addendum)
Advanced Heart Failure Team Consult Note   Primary Physician: Biagio Borg, MD PCP-Cardiologist:  No primary care provider on file.  Reason for Consultation: A/C biventricular HF  HPI:    Patricia Wagner is seen today for evaluation of A/C biventricular HF at the request of Dr Bonner Puna.   Patricia Wagner is a 67 y.o. female with a past medical history of paroxysmal atrial fibrillation, NICM cardiomyopathy, Biventricular CHF (LVEF 15% and RV severely reduced by TEE 03/25/17), HTN, hx of recurrent GIB, COPD, anemia, chronic foley catheter, and HLD.   She was last seen in HF clinic 05/20/18. She was in NSR. She was instructed to increase diuretics for a few days. She was tolerating Eliquis with no s/s bleeding. Repeat echo was ordered and she was referred back to EP for ?AF ablation.  She saw Dr Curt Bears 07/21/18. They decided to wait on ablation due to upcoming lumpectomy and unknown prognosis. He thought she would need to stay on antiarrhytmics even with ablation due to LA dilation.  She presented to [facility] ([place] 08/05/18 with facial droop and slurred speech. Symptoms have been present for 1 week per family and wax and waned. Family reports this has happened in the past when she was sick. She was hypoxic with sats 77% on RA. Given supplemental O2 with improvement in sats. Head CT negative for acute findings.   Pertinent admission labs include: K 3.9, creatinine 3.79, AST 58, ALT 98, WBC 5.6, hemoglobin 11.4, troponin 0.16 > 0.13, BNP 3951 (previously [phone]). EKG on admit showed NSR.  CXR: 1. Stable cardiomegaly. Chronic central pulmonary vascular congestion, consistent with chronic mild CHF. 2. Questionable small pleural effusion at the posterior costophrenic angle. No evidence of pneumonia or overt alveolar pulmonary edema.  MRI brain 1. No acute intracranial abnormality identified. 2. Small chronic infarctions within the right occipital lobe and the left superior cerebellar hemisphere. 3. Mild  chronic microvascular ischemic changes and volume loss of the brain. 4. Bilateral mastoid opacification.  She has received 40 mg IV lasix x2 with good UOP. Creatinine 3.79 > 3.89 this am.   This morning she became acutely hypoxic with sats 70s. ABG as below. Placed on BiPAP. Minimally responsive this am. Able to move all extremities to commands but unable to communicate. Was in NSR on admit. Converted to afib around 9 am.   ABG today shows pH 7.235, pCO2 68, pO2 136, bicarb 27.6  Echo 02/2018: EF 40-45%, grade 1 DD, RV normal  Pt unable to answer questions. No family present. Information obtained from chart review and RN.   Home Medications Prior to Admission medications   Medication Sig Start Date End Date Taking? Authorizing Provider  amiodarone (PACERONE) 200 MG tablet Take 1 tablet (200 mg total) by mouth daily. 03/11/18   Amin, Jeanella Flattery, MD  apixaban (ELIQUIS) 5 MG TABS tablet Take 1 tablet (5 mg total) by mouth 2 (two) times daily. 04/15/18   Georgiana Shore, NP  carvedilol (COREG) 3.125 MG tablet TAKE 1 TABLET (3.125 MG TOTAL) BY MOUTH 2 (TWO) TIMES DAILY. 07/02/18 09/30/18  [name], MD  epoetin alfa (EPOGEN,PROCRIT) 56314 UNIT/ML injection Inject 5,000 Units into the skin once a week.    [provider]  furosemide (LASIX) 20 MG tablet Take 2 tablets (40 mg total) by mouth daily. 06/24/18   Bensimhon, Shaune Pascal, MD  hydrALAZINE (APRESOLINE) 50 MG tablet Take 1 tablet (50 mg total) by mouth 3 (three) times daily. 04/15/18 07/15/18  Georgiana Shore, NP  Investigational - Study Medication Take 1 tablet by mouth 2 (two) times daily. Study name: Galactic HF Study  Additional study details: Omecamtiv Mecarbil or Placebo 03/18/18   Larey Dresser, MD  isosorbide mononitrate (IMDUR) 60 MG 24 hr tablet Take 1 tablet (60 mg total) by mouth daily. 04/15/18 08/05/18  Georgiana Shore, NP  metolazone (ZAROXOLYN) 2.5 MG tablet Take 1 tablet (2.5 mg total) by mouth as needed (swelling/  wt gain). Take 1 Tablet as directed by the CHF Clinic 05/25/18   Clegg, Amy D, NP  potassium chloride SA (K-DUR,KLOR-CON) 20 MEQ tablet Take 1 tablet (20 mEq total) by mouth daily. Take 4 tabs first day, then take 2 tabs daily 06/15/18   Shirley Friar, PA-C  ranolazine (RANEXA) 500 MG 12 hr tablet TAKE 1 TABLET BY MOUTH TWICE A DAY 03/16/18   Bensimhon, Shaune Pascal, MD  rosuvastatin (CRESTOR) 10 MG tablet Take 10 mg by mouth daily.    [provider]    Past Medical History: Past Medical History:  Diagnosis Date  . Acute blood loss anemia 03/05/2018  . Acute hypercapnic respiratory failure ([place]) 04/30/2017  . Acute renal failure superimposed on chronic kidney disease ([place]) 10/20/2016  . Arthritis   . Atrial fibrillation ([place])    a. s/p multiple cardioversions; failed tikosyn/sotalol.  . Bradycardia 11/20/2016  . CAD in native artery, s/p cardiac cath with non obstructive CAD 10/24/2016  . Cerebrovascular accident (CVA) due to embolism of left cerebellar artery ([place])   . CHF (congestive heart failure) ([place])   . Chronic anticoagulation 03/15/2018  . Chronic atrial fibrillation ([place]) 03/05/2018  . Chronic systolic (congestive) heart failure ([place]) 03/05/2018  . CKD (chronic kidney disease) stage 3, GFR 30-59 ml/min (HCC)   . Coagulopathy ([place]) 03/05/2018  . DIVERTICULITIS, HX OF 07/25/2007  . Diverticulosis of colon with hemorrhage 07/22/2007   Qualifier: Diagnosis of  By: [name]    . DIVERTICULOSIS, COLON 07/22/2007  . Dyspnea   . Dysuria 07/21/2017  . Edema, peripheral    a. chronic BLE edema, R>L. Prior trauma from dog attack and accident.  . Essential hypertension 07/22/2007   Qualifier: Diagnosis of  By: [name]    . HLD (hyperlipidemia) 02/03/2008   Qualifier: Diagnosis of  By: [name], [name]   . HYPERLIPIDEMIA 02/03/2008  . Hypersomnia    declines w/u  . Hypertension   . Hypotension (arterial) 04/30/2017  . MENOPAUSAL DISORDER 01/09/2011  . Morbid obesity  ([place]) 07/22/2007  . NICM (nonischemic cardiomyopathy) ([place]) 10/24/2016  . PAF (paroxysmal atrial fibrillation) ([place])   . Raynaud's syndrome 07/22/2007  . Stroke ([place]) 2017  . Suspected sleep apnea 05/20/2017  . THYROID NODULE, RIGHT 01/04/2010  . VITAMIN D DEFICIENCY 01/09/2011   Qualifier: Diagnosis of  By: [name], [name]     Past Surgical History: Past Surgical History:  Procedure Laterality Date  . CARDIAC CATHETERIZATION N/A 10/23/2016   Procedure: Left Heart Cath and Coronary Angiography;  Surgeon: [name], MD;  Location: [facility] CV LAB;  Service: Cardiovascular;  Laterality: N/A;  . CARDIOVERSION N/A 10/31/2016   Procedure: CARDIOVERSION;  Surgeon: [name], MD;  Location: [facility] ENDOSCOPY;  Service: Cardiovascular;  Laterality: N/A;  . CARDIOVERSION N/A 11/03/2016   Procedure: CARDIOVERSION;  Surgeon: [name], MD;  Location: [place];  Service: Cardiovascular;  Laterality: N/A;  . CARDIOVERSION N/A 11/18/2016   Procedure: CARDIOVERSION;  Surgeon: [name], MD;  Location:  [place] ENDOSCOPY;  Service: Cardiovascular;  Laterality: N/A;  . CARDIOVERSION N/A 03/25/2017   Procedure: CARDIOVERSION;  Surgeon: [name], MD;  Location: [facility] ENDOSCOPY;  Service: Cardiovascular;  Laterality: N/A;  . CARDIOVERSION N/A 05/06/2017   Procedure: CARDIOVERSION;  Surgeon: [name], MD;  Location: [facility] ENDOSCOPY;  Service: Cardiovascular;  Laterality: N/A;  . CARDIOVERSION N/A 05/12/2017   Procedure: CARDIOVERSION;  Surgeon: [name], MD;  Location: [facility] ENDOSCOPY;  Service: Cardiovascular;  Laterality: N/A;  . COLONOSCOPY N/A 12/04/2017   Procedure: COLONOSCOPY;  Surgeon: [name], MD;  Location: [facility] ENDOSCOPY;  Service: Endoscopy;  Laterality: N/A;  . COLONOSCOPY W/ POLYPECTOMY  02/2011   pan diverticulosis.  tubular adenoma without dysplasia on 5 mm sigmoid polyp.  Dr Fuller Plan.    Marland Kitchen PARTIAL HYSTERECTOMY     1 OVARY LEFT  . TEE WITHOUT CARDIOVERSION  N/A 10/31/2016   Procedure: TRANSESOPHAGEAL ECHOCARDIOGRAM (TEE);  Surgeon: [name], MD;  Location: [facility] ENDOSCOPY;  Service: Cardiovascular;  Laterality: N/A;  . TEE WITHOUT CARDIOVERSION N/A 03/25/2017   Procedure: TRANSESOPHAGEAL ECHOCARDIOGRAM (TEE);  Surgeon: [name], MD;  Location: [facility] ENDOSCOPY;  Service: Cardiovascular;  Laterality: N/A;    Family History: Family History  Problem Relation Age of Onset  . Asthma Mother   . Diabetes Father   . Heart disease Father        Died of presumed heart attack - 31s  . Lung disease Sister   . Heart disease Sister        Twin sister has heart issue, unclear what kind  . Thyroid disease Neg Hx     Social History: Social History   Socioeconomic History  . Marital status: Single    Spouse name: Not on file  . Number of children: 1  . Years of education: Not on file  . Highest education level: Not on file  Occupational History  . Occupation: Engineer, manufacturing  . Financial resource strain: Not on file  . Food insecurity:    Worry: Not on file    Inability: Not on file  . Transportation needs:    Medical: Not on file    Non-medical: Not on file  Tobacco Use  . Smoking status: Former Smoker    Packs/day: 0.50    Types: Cigarettes    Start date: 2019  . Smokeless tobacco: Never Used  . Tobacco comment: restarted back this year  Substance and Sexual Activity  . Alcohol use: No    Alcohol/week: 4.0 standard drinks    Types: 4 Cans of beer per week    Frequency: Never    Comment: Intermittent  . Drug use: No  . Sexual activity: Not on file  Lifestyle  . Physical activity:    Days per week: Not on file    Minutes per session: Not on file  . Stress: Not on file  Relationships  . Social connections:    Talks on phone: Not on file    Gets together: Not on file    Attends religious service: Not on file    Active member of club or organization: Not on file    Attends meetings of clubs or organizations: Not on  file    Relationship status: Not on file  Other Topics Concern  . Not on file  Social History Narrative  . Not on file    Allergies:  Allergies  Allergen Reactions  . Ace Inhibitors Palpitations    Objective:  Vital Signs:   Temp:  [98.2 F (36.8 C)-98.7 F (37.1 C)] 98.7 F (37.1 C) (09/13 0737) Pulse Rate:  [55-62] 56 (09/13 0737) Resp:  [12-22] 14 (09/13 0737) BP: (102-145)/(60-90) 114/72 (09/13 0737) SpO2:  [77 %-99 %] 84 % (09/13 0737) Weight:  [107.2 kg] 107.2 kg (09/13 0515) Last BM Date: 08/06/18  Weight change: Filed Weights   08/06/18 0515  Weight: 107.2 kg    Intake/Output:   Intake/Output Summary (Last 24 hours) at 08/06/2018 0934 Last data filed at 08/06/2018 0900 Gross per 24 hour  Intake -  Output 2200 ml  Net -2200 ml      Physical Exam    General: On BiPAP. Very drowsy. HEENT: normal on bipap Neck: supple. JVP to jaw. Carotids 2+ bilat; no bruits. No lymphadenopathy or thyromegaly appreciated. Cor: PMI nondisplaced. Irregular rate & rhythm. No rubs, gallops or murmurs. Lungs: clear/diminished anteriorly Abdomen: obese soft, nontender, nondistended. No hepatosplenomegaly. No bruits or masses. Good bowel sounds. Extremities: no cyanosis, clubbing, rash, BLE 2-3+ edema, cool. Neuro: drowsy. Moves extremities to command. Will not answer questions.    Telemetry   Converted into afib around 9 am this morning, rate 70s. Personally reviewed.   EKG    Afib 73 bpm with IVCD. Personally reviewed.   Labs   Basic Metabolic Panel: Recent Labs  Lab 08/05/18 03/12/46 08/05/18 12-Mar-2114 08/06/18 0614  NA 139 137 141  K 3.9 3.7 3.5  CL 101 102 102  CO2 23  --  27  GLUCOSE 78 72 74  BUN 65* 62* 64*  CREATININE 3.79* 3.70* 3.89*  CALCIUM 9.3  --  8.9    Liver Function Tests: Recent Labs  Lab 08/05/18 Mar 12, 2046 08/06/18 0614  AST 58* 51*  ALT 92* 84*  ALKPHOS 174* 172*  BILITOT 4.9* 3.7*  PROT 6.6 6.4*  ALBUMIN 3.5 3.4*   Recent Labs  Lab  08/05/18 2356  LIPASE 23   No results for input(s): AMMONIA in the last 168 hours.  CBC: Recent Labs  Lab 08/03/18 1302 08/05/18 2046/03/12 08/05/18 2115  WBC  --  5.6  --   NEUTROABS  --  4.3  --   HGB 11.2* 11.4* 15.0  HCT  --  40.5 44.0  MCV  --  83.0  --   PLT  --  194  --     Cardiac Enzymes: Recent Labs  Lab 08/05/18 2356 08/06/18 0614  TROPONINI 0.13* 0.16*    BNP: BNP (last 3 results) Recent Labs    08/05/18 2155  BNP 3,951.4*    ProBNP (last 3 results) No results for input(s): PROBNP in the last 8760 hours.   CBG: Recent Labs  Lab 08/06/18 0852  GLUCAP 61*    Coagulation Studies: Recent Labs    08/05/18 2046/03/12  LABPROT 26.5*  INR 2.46     Imaging   Dg Chest 2 View  Result Date: 08/05/2018 CLINICAL DATA:  Slurred speech, facial droop. EXAM: CHEST - 2 VIEW COMPARISON:  Chest x-rays dated 03/05/2018 and 03/30/2017. FINDINGS: Stable cardiomegaly. Chronic central pulmonary vascular congestion. No new confluent opacity to suggest a developing pneumonia. N questionable trace pleural effusion at the posterior costophrenic angles. No large pleural effusion. No pneumothorax. Osseous structures are unremarkable. IMPRESSION: 1. Stable cardiomegaly. Chronic central pulmonary vascular congestion, consistent with chronic mild CHF. 2. Questionable small pleural effusion at the posterior costophrenic angle. No evidence of pneumonia or overt alveolar pulmonary edema. Electronically Signed   By: Roxy Horseman.D.  On: 08/05/2018 21:58   Ct Head Wo Contrast  Result Date: 08/05/2018 CLINICAL DATA:  Slurred speech. Decreased grip strength. Right facial droop. Symptoms for 1 week. EXAM: CT HEAD WITHOUT CONTRAST TECHNIQUE: Contiguous axial images were obtained from the base of the skull through the vertex without intravenous contrast. COMPARISON:  12/29/2017 FINDINGS: Brain: Encephalomalacia in the left cerebellar hemisphere is stable. There is encephalomalacia in the right  posterior parietal lobe which is stable. There is no mass effect, midline shift, or acute hemorrhage. Vascular: No hyperdense vessel or unexpected calcification. Skull: Intact. Sinuses/Orbits: There is scattered fluid in the bilateral mastoid air cells. This is a new finding compared to the prior study. Visualized paranasal sinuses are clear. Unremarkable orbits. 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 IMPRESSION: No acute intracranial pathology. There is fluid in the bilateral mastoid air cells Chronic changes are noted. Electronically Signed   By: Marybelle Killings M.D.   On: 08/05/2018 21:49   Mr Brain Wo Contrast  Result Date: 08/06/2018 CLINICAL DATA:  67 y/o  F; slurred speech and right facial droop. EXAM: MRI HEAD WITHOUT CONTRAST TECHNIQUE: Multiplanar, multiecho pulse sequences of the brain and surrounding structures were obtained without intravenous contrast. COMPARISON:  08/05/2018 CT head.  11/20/2016 MRI head. FINDINGS: Brain: No acute infarction, hemorrhage, hydrocephalus, extra-axial collection or mass lesion. Small chronic cortical infarction within the right occipital lobe and left superior cerebellar hemisphere. Mild chronic microvascular ischemic changes of white matter and volume loss of the brain. Hemosiderin staining of the left superior cerebellar infarction. Vascular: Normal flow voids. Skull and upper cervical spine: Normal marrow signal. Sinuses/Orbits: Left anterior ethmoid mucous retention cyst. Bilateral mastoid opacification. Orbits are unremarkable. 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 IMPRESSION: 1. No acute intracranial abnormality identified. 2. Small chronic infarctions within the right occipital lobe and the left superior cerebellar hemisphere. 3. Mild chronic microvascular ischemic changes and volume loss of the brain. 4. Bilateral mastoid opacification. Electronically Signed   By: Kristine Garbe M.D.   On: 08/06/2018  03:42   US Abdomen Complete  Result Date: 08/06/2018 CLINICAL DATA:  67 y/o F; abnormal liver function tests. Chronic kidney disease stage 4. Acute renal failure. EXAM: ABDOMEN ULTRASOUND COMPLETE COMPARISON:  12/02/2017 CT abdomen and pelvis. 03/08/2018 renal ultrasound. FINDINGS: Gallbladder: Resected. Common bile duct: Diameter: 5.3 mm Liver: No focal lesion identified. Within normal limits in parenchymal echogenicity. Portal vein is patent on color Doppler imaging with normal direction of blood flow towards the liver. IVC: No abnormality visualized. Pancreas: Poorly visualized due to bowel gas shadow. Spleen: Size and appearance within normal limits. Right Kidney: Length: 10.6 cm. Echogenicity within normal limits. No mass or hydronephrosis visualized. Left Kidney: Length: 11.0 cm. Echogenicity within normal limits. Lower pole simple cyst measuring up to 4.1 cm. Abdominal aorta: Abdominal aortic atherosclerosis with wall calcifications. Greatest diameter 2.4 cm. Other findings: Foley catheter in situ. IMPRESSION: 1. No acute process identified. 2. Stable left kidney lower pole cyst. 3.  Aortic Atherosclerosis (ICD10-I70.0). Electronically Signed   By: Kristine Garbe M.D.   On: 08/06/2018 01:16   Dg Chest Port 1 View  Result Date: 08/06/2018 CLINICAL DATA:  Hypoxia EXAM: PORTABLE CHEST 1 VIEW COMPARISON:  08/05/2018 FINDINGS: Cardiac shadow is enlarged but stable. Aortic calcifications are again seen. Right lung is well aerated without focal infiltrate. Persistent vascular congestion is noted. Some increasing density in the left base is noted consistent with evolving infiltrate. No bony abnormality is seen. IMPRESSION:  Increasing left retrocardiac density consistent with evolving infiltrate. Mild vascular congestion remains. Electronically Signed   By: Inez Catalina M.D.   On: 08/06/2018 09:14      Medications:     Current Medications: .  stroke: mapping our early stages of recovery book    Does not apply Once  . albuterol      . amiodarone  200 mg Oral Daily  . apixaban  5 mg Oral BID  . carvedilol  3.125 mg Oral BID WC  . furosemide  40 mg Intravenous Q12H  . hydrALAZINE  50 mg Oral Q8H  . isosorbide mononitrate  60 mg Oral Daily  . ranolazine  500 mg Oral BID  . sodium chloride flush  3 mL Intravenous Q12H     Infusions: . sodium chloride         Patient Profile   Patricia Wagner is a 67 y.o. female with a past medical history of paroxysmal atrial fibrillation, NICM cardiomyopathy, Biventricular CHF (LVEF 15% and RV severely reduced by TEE 03/25/17), HTN, hx of recurrent GIB, COPD, anemia, chronic foley catheter, and HLD.   Admitted with facial droop/slurred speech and hypoxia.  Assessment/Plan   1. A/C hypoxic respiratory failure - Afebrile. WBC normal.  - Became hypoxic again this am with sats 70s. ABG showed pH 7.235, pCO2 68, pO2 136, bicarb 27.6. She was put on BiPAP  2. Facial droop/slurred speech - Head CT negative - NPO for now. - MRI showed no acute changes.  - Urine culture with rare bacteria + casts.   3. Acute on chronic biventricular CHF due to NICM (suspect AF cardiomyopathy)  - TEE 03/25/17 with LVEF 15% and severely reduced RV function.   - Echo  06/2017  EF 30-35% (up from 15% in setting of AF in 5/18).  - Echo 02/2018: EF 40-45%, grade 1 DD, RV normal - Amyloid w/u negative with negative fat pad biopsy and PYP scan  - Volume status elevated.  - Bedside echo completed by Dr [name], which showed EF 40-45%, but RV severely reduced. Low suspicion for PE since she is on chronic AC.  - Received 40 mg IV lasix x2 overnight with good response. Continue for now. Increase as needed.  - Hold imdur, hydral, and coreg for now. May need NE. - Off spiro and Entresto with CKD - May need inotrope support (has needed in past when she goes into afib > shock). Have asked CCM to line her. Will determine need for inotropes based on coox.  - Will check Coox  and CVP once central line placed.   4. Persistent Afib: Cannot tolerate AF with recurrent shock. S/p DCCV on 5/2 (she stopped Amio after this) and DCCV on 05/06/17 and again 05/12/17. - She absolutely cannot tolerate AF with recurrent cardiogenic shock when AF recurs. - Converted into afib this am around 9 am. Unable to take PO meds. - Start amio drip. Check lactate. Move to ICU. - Continue Ranexa 500 mg BID. Unable to take PO right now. - Continue Eliquis 5 mg BID. Will need to start heparin drip if she does not get NGT.  - EP follows outpatient. Holding off on ablation for now because she needs lumpectomy.  5. Suspected Sleep Apnea: - Refuses sleep study. No change.    6.  COPD:   7. AKI on CKD - Creatinine 3.89 today. Baseline 1.6-1.8 - Has chronic catheter now - follows with urology  8. HTN - SBP 110-140s  9. Anemia -  Hemoglobin stable 15.0  Will transfer to ICU for central line placement and probable intubation. May need inotrope support for recurrent cardiogenic shock in setting of afib. Will await coox.  Medication concerns reviewed with patient and pharmacy team. Barriers identified: unable to determine at this time.   Length of Stay: [place], NP  08/06/2018, 9:34 AM  Advanced Heart Failure Team Pager [phone] (M-F; 7a - 4p)  Please contact [facility] for night-coverage after hours (4p -7a ) and weekends on [email or web address]  67 y/o woman with h/o severe systolic HF due to tachy-related CM with severe biventricular HF (EF always gets worse in setting of AF). Has been maintained in NSR on amio. Last EF 40-45% (4/19) but still with severe RV failure. Recently admitted with bladder outlet obstruction with hydronephrosis and AKI. Now with chronic indwelling Foley.   Apparently feeling poorly for past week. Presented to ER with lethargy and possible CVA. CT negative. Remains very drowsy. Barely able to respond. ABG with severe respiratory acidosis. Labs also c/w  recurrent AKI  Was in NSR on admit but converted to AF around 9a today  On exam  Mildly arousable. Can follow basic commands. JVP to ear Cor IRR Lungs + crackles Ab obese soft Ext cool 3+ edema  Bedside echo done personally EF 40-45% with severely dilated RV with severe HK.   I am unsure what triggering event was but she has severe respiratory acidosis and lethargy with Co2 retention. Discussed with CCM. Will need to be intubated emergently. EF stable but has severe RV failure on exam and by echo (stable by echo). Continue diuresis. Check co-ox and CBC. If co-ox low would support RV with norepi. Wil start IV amio. Needs to maintain NSR because she can absalutely not tolerate AF as it causes recurrent cardiogenic shock .Start IV heparin. Broad spectrum abx for possible aspiration PNA on CXR.   CRITICAL CARE Performed by: Glori Bickers  Total critical care time: 35 minutes  Critical care time was exclusive of separately billable procedures and treating other patients.  Critical care was necessary to treat or prevent imminent or life-threatening deterioration.  Critical care was time spent personally by me (independent of midlevel providers or residents) on the following activities: development of treatment plan with patient and/or surrogate as well as nursing, discussions with consultants, evaluation of patient's response to treatment, examination of patient, obtaining history from patient or surrogate, ordering and performing treatments and interventions, ordering and review of laboratory studies, ordering and review of radiographic studies, pulse oximetry and re-evaluation of patient's condition.  Glori Bickers, MD  1:07 PM

## 2018-08-06 NOTE — Progress Notes (Signed)
Patient transported by RN, Rapid Response RN, SWOT RN, and RT via hospital bed to [phone] for escalation of care. Bedside report update given to receiving RN. All questions answered.

## 2018-08-06 NOTE — Evaluation (Signed)
Clinical/Bedside Swallow Evaluation Patient Details  Name: Patricia Wagner MRN: [id number] Date of Birth: 01-May-1951  Today's Date: 08/06/2018 Time: SLP Start Time (ACUTE ONLY): 0934 SLP Stop Time (ACUTE ONLY): 0942 SLP Time Calculation (min) (ACUTE ONLY): 8 min  Past Medical History:  Past Medical History:  Diagnosis Date  . Acute blood loss anemia 03/05/2018  . Acute hypercapnic respiratory failure ([place]) 04/30/2017  . Acute renal failure superimposed on chronic kidney disease ([place]) 10/20/2016  . Arthritis   . Atrial fibrillation ([place])    a. s/p multiple cardioversions; failed tikosyn/sotalol.  . Bradycardia 11/20/2016  . CAD in native artery, s/p cardiac cath with non obstructive CAD 10/24/2016  . Cerebrovascular accident (CVA) due to embolism of left cerebellar artery ([place])   . CHF (congestive heart failure) ([place])   . Chronic anticoagulation 03/15/2018  . Chronic atrial fibrillation ([place]) 03/05/2018  . Chronic systolic (congestive) heart failure ([place]) 03/05/2018  . CKD (chronic kidney disease) stage 3, GFR 30-59 ml/min (HCC)   . Coagulopathy ([place]) 03/05/2018  . DIVERTICULITIS, HX OF 07/25/2007  . Diverticulosis of colon with hemorrhage 07/22/2007   Qualifier: Diagnosis of  By: [name]    . DIVERTICULOSIS, COLON 07/22/2007  . Dyspnea   . Dysuria 07/21/2017  . Edema, peripheral    a. chronic BLE edema, R>L. Prior trauma from dog attack and accident.  . Essential hypertension 07/22/2007   Qualifier: Diagnosis of  By: [name]    . HLD (hyperlipidemia) 02/03/2008   Qualifier: Diagnosis of  By: [name], [name]   . HYPERLIPIDEMIA 02/03/2008  . Hypersomnia    declines w/u  . Hypertension   . Hypotension (arterial) 04/30/2017  . MENOPAUSAL DISORDER 01/09/2011  . Morbid obesity ([place]) 07/22/2007  . NICM (nonischemic cardiomyopathy) ([place]) 10/24/2016  . PAF (paroxysmal atrial fibrillation) ([place])   . Raynaud's syndrome 07/22/2007  . Stroke ([place]) 2017  . Suspected sleep apnea 05/20/2017   . THYROID NODULE, RIGHT 01/04/2010  . VITAMIN D DEFICIENCY 01/09/2011   Qualifier: Diagnosis of  By: [name], [name]    Past Surgical History:  Past Surgical History:  Procedure Laterality Date  . CARDIAC CATHETERIZATION N/A 10/23/2016   Procedure: Left Heart Cath and Coronary Angiography;  Surgeon: [name], MD;  Location: [place];  Service: Cardiovascular;  Laterality: N/A;  . CARDIOVERSION N/A 10/31/2016   Procedure: CARDIOVERSION;  Surgeon: [name], MD;  Location: [facility];  Service: Cardiovascular;  Laterality: N/A;  . CARDIOVERSION N/A 11/03/2016   Procedure: CARDIOVERSION;  Surgeon: [name], MD;  Location: [facility];  Service: Cardiovascular;  Laterality: N/A;  . CARDIOVERSION N/A 11/18/2016   Procedure: CARDIOVERSION;  Surgeon: [name], MD;  Location: [place] [facility];  Service: Cardiovascular;  Laterality: N/A;  . CARDIOVERSION N/A 03/25/2017   Procedure: CARDIOVERSION;  Surgeon: [name], MD;  Location: [facility] ENDOSCOPY;  Service: Cardiovascular;  Laterality: N/A;  . CARDIOVERSION N/A 05/06/2017   Procedure: CARDIOVERSION;  Surgeon: [name], MD;  Location: [facility] ENDOSCOPY;  Service: Cardiovascular;  Laterality: N/A;  . CARDIOVERSION N/A 05/12/2017   Procedure: CARDIOVERSION;  Surgeon: [name], MD;  Location: [facility] ENDOSCOPY;  Service: Cardiovascular;  Laterality: N/A;  . COLONOSCOPY N/A 12/04/2017   Procedure: COLONOSCOPY;  Surgeon: [name], MD;  Location: [place];  Service: Endoscopy;  Laterality: N/A;  . COLONOSCOPY W/ POLYPECTOMY  02/2011   pan diverticulosis.  tubular adenoma without dysplasia on 5 mm sigmoid polyp.  Dr Fuller Plan.    Marland Kitchen  PARTIAL HYSTERECTOMY     1 OVARY LEFT  . TEE WITHOUT CARDIOVERSION N/A 10/31/2016   Procedure: TRANSESOPHAGEAL ECHOCARDIOGRAM (TEE);  Surgeon: [name], MD;  Location: [facility] ENDOSCOPY;  Service: Cardiovascular;  Laterality: N/A;  . TEE WITHOUT CARDIOVERSION N/A 03/25/2017    Procedure: TRANSESOPHAGEAL ECHOCARDIOGRAM (TEE);  Surgeon: [name], MD;  Location: [facility] ENDOSCOPY;  Service: Cardiovascular;  Laterality: N/A;   HPI:  Patricia Wagner is a 67 y.o. female with medical history significant for paroxysmal atrial fibrillation, chronic combined systolic and diastolic CHF, coronary artery disease, chronic kidney disease stage IV, CVA, HTN, and hypertension presented to the emergency department for evaluation of slurred speech and facial droop (x 1 week). Per MD notes, saturating 77% on room air, mildly tachypneic,. Noncontrast head CT is negative for acute findings.  Chest x-ray is notable for stable cardiomegaly, vascular congestion, and likely mild CHF.     Assessment / Plan / Recommendation Clinical Impression  Pt lethargic, drowsy however verbally responsive, following commands, keeps eyes closed. Generally weak oral movements, volitional cough and low vocal intensity. Currently on Venturi mask saturating 97%. Limited trials of water via teaspoon followed by delayed multiple throat clears and wet vocal quality indicative of inadequate airway protection. Recommend continued NPO with until alertness and awareness improves and ST to follow for appropriateness to initiate po's.  SLP Visit Diagnosis: Dysphagia, oropharyngeal phase (R13.12)    Aspiration Risk  Severe aspiration risk    Diet Recommendation NPO        Other  Recommendations Oral Care Recommendations: Oral care BID   Follow up Recommendations Other (comment)(TBD)      Frequency and Duration min 2x/week  2 weeks       Prognosis        Swallow Study   General HPI: Patricia Wagner is a 67 y.o. female with medical history significant for paroxysmal atrial fibrillation, chronic combined systolic and diastolic CHF, coronary artery disease, chronic kidney disease stage IV, CVA, HTN, and hypertension presented to the emergency department for evaluation of slurred speech and facial droop (x 1 week). Per  MD notes, saturating 77% on room air, mildly tachypneic,. Noncontrast head CT is negative for acute findings.  Chest x-ray is notable for stable cardiomegaly, vascular congestion, and likely mild CHF.   Type of Study: Bedside Swallow Evaluation Previous Swallow Assessment: (none found) Diet Prior to this Study: NPO Temperature Spikes Noted: No Respiratory Status: Venti-mask History of Recent Intubation: No Behavior/Cognition: Requires cueing;Lethargic/Drowsy Oral Cavity Assessment: Within Functional Limits Oral Care Completed by SLP: No Oral Cavity - Dentition: Adequate natural dentition Vision: Functional for self-feeding Self-Feeding Abilities: Needs assist Patient Positioning: Upright in bed Baseline Vocal Quality: Low vocal intensity Volitional Cough: Weak    Oral/Motor/Sensory Function Overall Oral Motor/Sensory Function: Generalized oral weakness   Ice Chips Ice chips: Not tested   Thin Liquid Thin Liquid: Impaired Presentation: Spoon Oral Phase Impairments: Reduced labial seal Pharyngeal  Phase Impairments: Throat Clearing - Delayed    Nectar Thick Nectar Thick Liquid: Within functional limits   Honey Thick Honey Thick Liquid: Not tested   Puree Puree: Not tested   Solid     Solid: Not tested      Houston Siren 08/06/2018,10:01 AM   Orbie Pyo Colvin Caroli.Ed Risk analyst [phone] Office [phone]

## 2018-08-06 NOTE — Procedures (Signed)
OGT inserted by MD under direct laryngoscopy and verified by auscultation  [name], M.D. [facility] Pulmonary/Critical Care Medicine. Pager: [phone]. After hours pager: [phone].

## 2018-08-06 NOTE — Progress Notes (Signed)
Pt placed on BIPAP 12/6 50% per MD order after ABG results.  RT will continue to monitor.

## 2018-08-06 NOTE — Significant Event (Signed)
Rapid Response Event Note RN called for sats 77% on NRB  Overview: Time Called: 0817 Arrival Time: 0820 Event Type: Respiratory  Initial Focused Assessment: On arrival pt lying supine in bed, skin warm and dry, lethargic, responds to sternal rub, 100% on NRB, BBS expiratory wheezing with crackles, SB went into AFib. Dr. Nelda Marseille, Dr. Lamount Cohen and [name] to bedside.   Interventions: ABG 7.23/67.5/136/27.6 CXR  CBG 61- 1 amp D50  Lasix 40 mg IVP UO 2300 EKG AFib 20G SL RLFA Bipap  Event Summary: Name of Physician Notified: Dr. Bonner Puna at [phone]  Name of Consulting Physician Notified: Dr. Nelda Marseille  at [place]: Transferred (Comment)(13m11)  Event End Time: 1130  [place] Pickup

## 2018-08-06 NOTE — ED Notes (Signed)
Patient transported to MRI 

## 2018-08-07 ENCOUNTER — Inpatient Hospital Stay (HOSPITAL_COMMUNITY): Payer: Medicare HMO

## 2018-08-07 DIAGNOSIS — J9621 Acute and chronic respiratory failure with hypoxia: Secondary | ICD-10-CM

## 2018-08-07 DIAGNOSIS — N184 Chronic kidney disease, stage 4 (severe): Secondary | ICD-10-CM

## 2018-08-07 DIAGNOSIS — N179 Acute kidney failure, unspecified: Secondary | ICD-10-CM

## 2018-08-07 LAB — HEPARIN LEVEL (UNFRACTIONATED)

## 2018-08-07 LAB — CBC
HCT: 43.5 % (ref 36.0–46.0)
Hemoglobin: 12.2 g/dL (ref 12.0–15.0)
MCH: 23.1 pg — ABNORMAL LOW (ref 26.0–34.0)
MCHC: 28 g/dL — ABNORMAL LOW (ref 30.0–36.0)
MCV: 82.4 fL (ref 78.0–100.0)
Platelets: 166 K/uL (ref 150–400)
RBC: 5.28 MIL/uL — ABNORMAL HIGH (ref 3.87–5.11)
RDW: 23.7 % — ABNORMAL HIGH (ref 11.5–15.5)
WBC: 6.8 K/uL (ref 4.0–10.5)

## 2018-08-07 LAB — GLUCOSE, CAPILLARY
GLUCOSE-CAPILLARY: 122 mg/dL — AB (ref 70–99)
GLUCOSE-CAPILLARY: 111 mg/dL — AB (ref 70–99)
Glucose-Capillary: 127 mg/dL — ABNORMAL HIGH (ref 70–99)
Glucose-Capillary: 147 mg/dL — ABNORMAL HIGH (ref 70–99)

## 2018-08-07 LAB — BASIC METABOLIC PANEL WITH GFR
Anion gap: 9 (ref 5–15)
BUN: 60 mg/dL — ABNORMAL HIGH (ref 8–23)
CO2: 34 mmol/L — ABNORMAL HIGH (ref 22–32)
Calcium: 8.8 mg/dL — ABNORMAL LOW (ref 8.9–10.3)
Chloride: 101 mmol/L (ref 98–111)
Creatinine, Ser: 3.03 mg/dL — ABNORMAL HIGH (ref 0.44–1.00)
GFR calc Af Amer: 18 mL/min — ABNORMAL LOW
GFR calc non Af Amer: 15 mL/min — ABNORMAL LOW
Glucose, Bld: 128 mg/dL — ABNORMAL HIGH (ref 70–99)
Potassium: 2.7 mmol/L — CL (ref 3.5–5.1)
Sodium: 144 mmol/L (ref 135–145)

## 2018-08-07 LAB — APTT
APTT: 128 s — AB (ref 24–36)
aPTT: 31 seconds (ref 24–36)
aPTT: 127 seconds — ABNORMAL HIGH (ref 24–36)

## 2018-08-07 LAB — COOXEMETRY PANEL
Carboxyhemoglobin: 2.2 % — ABNORMAL HIGH (ref 0.5–1.5)
Methemoglobin: 1.6 % — ABNORMAL HIGH (ref 0.0–1.5)
O2 Saturation: 87.8 %
Total hemoglobin: 12.3 g/dL (ref 12.0–16.0)

## 2018-08-07 LAB — CK: Total CK: 17 U/L — ABNORMAL LOW (ref 38–234)

## 2018-08-07 LAB — UREA NITROGEN, URINE: Urea Nitrogen, Ur: 704 mg/dL

## 2018-08-07 LAB — MAGNESIUM: MAGNESIUM: 2.6 mg/dL — AB (ref 1.7–2.4)

## 2018-08-07 LAB — PHOSPHORUS: Phosphorus: 3.5 mg/dL (ref 2.5–4.6)

## 2018-08-07 MED ORDER — ORAL CARE MOUTH RINSE
15.0000 mL | Freq: Two times a day (BID) | OROMUCOSAL | Status: DC
  Administered 2018-08-07: 15 mL via OROMUCOSAL

## 2018-08-07 MED ORDER — FENTANYL CITRATE (PF) 100 MCG/2ML IJ SOLN: 12.5000 ug | INTRAMUSCULAR | Status: DC | PRN

## 2018-08-07 MED ORDER — POTASSIUM CHLORIDE 20 MEQ/15ML (10%) PO SOLN
40.0000 meq | ORAL | Status: AC
  Administered 2018-08-07 (×2): 40 meq
  Filled 2018-08-07 (×2): qty 30

## 2018-08-07 NOTE — Progress Notes (Signed)
Progress Note  Patient Name: Patricia Wagner Date of Encounter: 08/07/2018  Primary Cardiologist: No primary care provider on file.   Subjective   Extubated today at 11:45 AM.  Currently sleeping.  No complaints.  She has been maintaining sinus rhythm with IV amiodarone.  Inpatient Medications    Scheduled Meds: . amiodarone  150 mg Intravenous Once  . furosemide  40 mg Intravenous Q12H  . mouth rinse  15 mL Mouth Rinse BID  . sodium chloride flush  3 mL Intravenous Q12H   Continuous Infusions: . sodium chloride    . amiodarone 30 mg/hr (08/07/18 1400)  . feeding supplement (VITAL AF 1.2 CAL) Stopped (08/07/18 1200)  . heparin 1,500 Units/hr (08/07/18 1400)   PRN Meds: sodium chloride, fentaNYL (SUBLIMAZE) injection, sodium chloride flush   Vital Signs    Vitals:   08/07/18 1145 08/07/18 1200 08/07/18 1300 08/07/18 1400  BP: 103/61 106/61 101/65 104/63  Pulse: (!) 51 (!) 49 (!) 51 (!) 51  Resp: 19 18 13 13   Temp:      TempSrc:      SpO2: 99% 100% 100% 100%  Weight:      Height:        Intake/Output Summary (Last 24 hours) at 08/07/2018 1501 Last data filed at 08/07/2018 1400 Gross per 24 hour  Intake 1898.97 ml  Output 4248 ml  Net -2349.03 ml   Filed Weights   08/06/18 0515 08/07/18 0428  Weight: 107.2 kg 99.1 kg    Telemetry    Sinus bradycardia heart rate in the 50s- Personally Reviewed  ECG    08/06/2018 showed atrial fibrillation/flutter- Personally Reviewed  Physical Exam   GEN: No acute distress.  Sleeping, extubated Neck:  Mid neck JVD Cardiac:  Bradycardic regular, soft systolic murmur, no rubs, or gallops.  Respiratory:  Minimal crackles bilaterally. GI: Soft, nontender, non-distended  MS:  2+ lower extremity edema; No deformity. Neuro:  Nonfocal  Psych: [place]    Chemistry Recent Labs  Lab 08/05/18 2047 08/05/18 2115 08/06/18 0614 08/07/18 0358  NA 139 137 141 144  K 3.9 3.7 3.5 2.7*  CL 101 102 102 101  CO2 23  --  27  34*  GLUCOSE 78 72 74 128*  BUN 65* 62* 64* 60*  CREATININE 3.79* 3.70* 3.89* 3.03*  CALCIUM 9.3  --  8.9 8.8*  PROT 6.6  --  6.4*  --   ALBUMIN 3.5  --  3.4*  --   AST 58*  --  51*  --   ALT 92*  --  84*  --   ALKPHOS 174*  --  172*  --   BILITOT 4.9*  --  3.7*  --   GFRNONAA 12*  --  11* 15*  GFRAA 13*  --  13* 18*  ANIONGAP 15  --  12 9     Hematology Recent Labs  Lab 08/05/18 2047 08/05/18 2115 08/07/18 0358  WBC 5.6  --  6.8  RBC 4.88  --  5.28*  HGB 11.4* 15.0 12.2  HCT 40.5 44.0 43.5  MCV 83.0  --  82.4  MCH 23.4*  --  23.1*  MCHC 28.1*  --  28.0*  RDW 23.9*  --  23.7*  PLT 194  --  166    Cardiac Enzymes Recent Labs  Lab 08/05/18 2356 08/06/18 0614 08/06/18 1200  TROPONINI 0.13* 0.16* 0.16*    Recent Labs  Lab 08/05/18 2055  TROPIPOC 0.16*  BNP Recent Labs  Lab 08/05/18 2155  BNP 3,951.4*     DDimer No results for input(s): DDIMER in the last 168 hours.   Radiology    Dg Chest 1 View  Result Date: 08/06/2018 CLINICAL DATA:  Central line placement. EXAM: CHEST  1 VIEW COMPARISON:  Earlier today. FINDINGS: Interval right jugular catheter with its tip in the superior vena cava. No pneumothorax. Interval endotracheal tube in satisfactory position. Interval nasogastric tube extending into the stomach. Stable enlarged cardiac silhouette. Decreased retrocardiac opacity on the left. Stable prominence of the pulmonary vasculature and interstitial markings. No pleural fluid seen. Thoracic spine degenerative changes. IMPRESSION: 1. Right jugular catheter tip in the superior vena cava without pneumothorax. 2. Decreased left lower lobe atelectasis or pneumonia. 3. Stable cardiomegaly and mild changes of congestive heart failure. Electronically Signed   By: Claudie Revering M.D.   On: 08/06/2018 13:08   Dg Chest 2 View  Result Date: 08/05/2018 CLINICAL DATA:  Slurred speech, facial droop. EXAM: CHEST - 2 VIEW COMPARISON:  Chest x-rays dated 03/05/2018 and  03/30/2017. FINDINGS: Stable cardiomegaly. Chronic central pulmonary vascular congestion. No new confluent opacity to suggest a developing pneumonia. N questionable trace pleural effusion at the posterior costophrenic angles. No large pleural effusion. No pneumothorax. Osseous structures are unremarkable. IMPRESSION: 1. Stable cardiomegaly. Chronic central pulmonary vascular congestion, consistent with chronic mild CHF. 2. Questionable small pleural effusion at the posterior costophrenic angle. No evidence of pneumonia or overt alveolar pulmonary edema. Electronically Signed   By: Franki Cabot M.D.   On: 08/05/2018 21:58   Dg Abd 1 View  Result Date: 08/06/2018 CLINICAL DATA:  Orogastric tube placement. EXAM: ABDOMEN - 1 VIEW COMPARISON:  12/02/2017 FINDINGS: Orogastric tube extends into the abdomen. The tip is in the right abdomen and probably in the proximal duodenum. Cholecystectomy clips in the right upper abdomen. IMPRESSION: Orogastric tube tip in the region of the proximal duodenum. Electronically Signed   By: Markus Daft M.D.   On: 08/06/2018 13:19   Ct Head Wo Contrast  Result Date: 08/05/2018 CLINICAL DATA:  Slurred speech. Decreased grip strength. Right facial droop. Symptoms for 1 week. EXAM: CT HEAD WITHOUT CONTRAST TECHNIQUE: Contiguous axial images were obtained from the base of the skull through the vertex without intravenous contrast. COMPARISON:  12/29/2017 FINDINGS: Brain: Encephalomalacia in the left cerebellar hemisphere is stable. There is encephalomalacia in the right posterior parietal lobe which is stable. There is no mass effect, midline shift, or acute hemorrhage. Vascular: No hyperdense vessel or unexpected calcification. Skull: Intact. Sinuses/Orbits: There is scattered fluid in the bilateral mastoid air cells. This is a new finding compared to the prior study. Visualized paranasal sinuses are clear. Unremarkable orbits. 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 IMPRESSION: No  acute intracranial pathology. There is fluid in the bilateral mastoid air cells Chronic changes are noted. Electronically Signed   By: Marybelle Killings M.D.   On: 08/05/2018 21:49   Mr Brain Wo Contrast  Result Date: 08/06/2018 CLINICAL DATA:  67 y/o  F; slurred speech and right facial droop. EXAM: MRI HEAD WITHOUT CONTRAST TECHNIQUE: Multiplanar, multiecho pulse sequences of the brain and surrounding structures were obtained without intravenous contrast. COMPARISON:  08/05/2018 CT head.  11/20/2016 MRI head. FINDINGS: Brain: No acute infarction, hemorrhage, hydrocephalus, extra-axial collection or mass lesion. Small chronic cortical infarction within the right occipital lobe and left superior cerebellar hemisphere. Mild chronic microvascular ischemic changes of white matter and volume loss of the brain. Hemosiderin staining  of the left superior cerebellar infarction. Vascular: Normal flow voids. Skull and upper cervical spine: Normal marrow signal. Sinuses/Orbits: Left anterior ethmoid mucous retention cyst. Bilateral mastoid opacification. Orbits are unremarkable. 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 IMPRESSION: 1. No acute intracranial abnormality identified. 2. Small chronic infarctions within the right occipital lobe and the left superior cerebellar hemisphere. 3. Mild chronic microvascular ischemic changes and volume loss of the brain. 4. Bilateral mastoid opacification. Electronically Signed   By: Kristine Garbe M.D.   On: 08/06/2018 03:42   US Abdomen Complete  Result Date: 08/06/2018 CLINICAL DATA:  67 y/o F; abnormal liver function tests. Chronic kidney disease stage 4. Acute renal failure. EXAM: ABDOMEN ULTRASOUND COMPLETE COMPARISON:  12/02/2017 CT abdomen and pelvis. 03/08/2018 renal ultrasound. FINDINGS: Gallbladder: Resected. Common bile duct: Diameter: 5.3 mm Liver: No focal lesion identified. Within normal limits in parenchymal  echogenicity. Portal vein is patent on color Doppler imaging with normal direction of blood flow towards the liver. IVC: No abnormality visualized. Pancreas: Poorly visualized due to bowel gas shadow. Spleen: Size and appearance within normal limits. Right Kidney: Length: 10.6 cm. Echogenicity within normal limits. No mass or hydronephrosis visualized. Left Kidney: Length: 11.0 cm. Echogenicity within normal limits. Lower pole simple cyst measuring up to 4.1 cm. Abdominal aorta: Abdominal aortic atherosclerosis with wall calcifications. Greatest diameter 2.4 cm. Other findings: Foley catheter in situ. IMPRESSION: 1. No acute process identified. 2. Stable left kidney lower pole cyst. 3.  Aortic Atherosclerosis (ICD10-I70.0). Electronically Signed   By: Kristine Garbe M.D.   On: 08/06/2018 01:16   Portable Chest Xray  Result Date: 08/07/2018 CLINICAL DATA:  Aspiration pneumonia EXAM: PORTABLE CHEST 1 VIEW COMPARISON:  08/06/2018 FINDINGS: Cardiac shadow is again enlarged. The endotracheal tube, right jugular central line and nasogastric catheter are again seen in satisfactory position. The lungs are well aerated bilaterally. Stable vascular congestion is noted. No focal confluent infiltrate is seen. IMPRESSION: Stable vascular congestion without focal infiltrate. Electronically Signed   By: Inez Catalina M.D.   On: 08/07/2018 07:19   Dg Chest Port 1 View  Result Date: 08/06/2018 CLINICAL DATA:  Hypoxia EXAM: PORTABLE CHEST 1 VIEW COMPARISON:  08/05/2018 FINDINGS: Cardiac shadow is enlarged but stable. Aortic calcifications are again seen. Right lung is well aerated without focal infiltrate. Persistent vascular congestion is noted. Some increasing density in the left base is noted consistent with evolving infiltrate. No bony abnormality is seen. IMPRESSION: Increasing left retrocardiac density consistent with evolving infiltrate. Mild vascular congestion remains. Electronically Signed   By: Inez Catalina M.D.   On: 08/06/2018 09:14    Cardiac Studies   Echocardiogram 03/06/2018: - Left ventricle: The cavity size was normal. There was severe   concentric hypertrophy. Systolic function was mildly to   moderately reduced. The estimated ejection fraction was in the   range of 40% to 45%. Mild diffuse hypokinesis with no   identifiable regional variations. Doppler parameters are   consistent with abnormal left ventricular relaxation (grade 1   diastolic dysfunction). - Aortic valve: There was mild stenosis. Valve area (VTI): 1.5   cm^2. Valve area (Vmax): 1.59 cm^2. Valve area (Vmean): 1.4 cm^2. - Mitral valve: Calcified annulus. - Left atrium: The atrium was severely dilated. - Right atrium: The atrium was moderately dilated. - Pericardium, extracardiac: There was a left pleural effusion.  TEE on 03/25/2017 EF 15% with severely reduced right ventricular function    Patient Profile  67 y.o. female with acute respiratory failure secondary to acute on chronic biventricular heart failure.  Assessment & Plan    Acute hypoxic respiratory failure/acute on chronic biventricular heart failure - Intubated on 9/13 -extubated 9/14 at 11:45 AM, secondary to cardiomyopathy -Continuing with Lasix 40 mg IV twice daily. - Out net 5.5 L yesterday.  Excellent. At this point holding on carvedilol, isosorbide, hydralazine, spironolactone, Entresto.  She is not requiring pressor support at this time.  Off of antibiotics.  Persistent atrial fibrillation - Post cardioversion on 5/2, stop amiodarone after this, cardioverted again twice in June.  Felt that she cannot tolerate atrial fibrillation with her current cardiogenic shock.  Currently maintaining sinus bradycardia. -Amiodarone drip was started yesterday 9/13, Ranexa was continued 500 mg twice a day, anticoagulation continued. -Currently holding off on ablation therapy by Dr. Curt Bears and she needs lumpectomy.  We will transition back to p.o.  amiodarone tomorrow.  Also likely transition back to p.o. Eliquis tomorrow as well.  We would stop IV heparin at that time.  Acute kidney injury -Baseline creatinine 1.6-1.8, currently slightly improved at 3.03 from yesterday 3.3.  Hypoperfusion.  Elevated troponin -Secondary to demand ischemia in the setting of heart failure  Suspected sleep apnea -Refuses sleep study  Critical care time 35 minutes spent with extensive data review on patient, nursing discussion in this individual with multisystem organ failure.      For questions or updates, please contact [place] Please consult [email or web address] for contact info under        Signed, Candee Furbish, MD  08/07/2018, 3:01 PM

## 2018-08-07 NOTE — Progress Notes (Signed)
CRITICAL VALUE ALERT  Critical Value:  Potassium 2.7  Date & Time Notied:  08/07/18 @ 7628  Provider Notified: MD Deterding

## 2018-08-07 NOTE — Progress Notes (Signed)
[facility] for heparin Indication: atrial fibrillation  Allergies  Allergen Reactions  . Ace Inhibitors Palpitations    Patient Measurements: Height: 5\' 6"  (167.6 cm) Weight: 218 lb 7.6 oz (99.1 kg) IBW/kg (Calculated) : 59.3 Heparin Dosing Weight: 84 kg  Vital Signs: Temp: 98.2 F (36.8 C) (09/14 0748) Temp Source: Axillary (09/14 0748) BP: 107/54 (09/14 1000) Pulse Rate: 49 (09/14 1000)  Labs: Recent Labs    08/05/18 2047 08/05/18 2115 08/05/18 2356 08/06/18 0614 08/06/18 1200 08/06/18 1344 08/06/18 2208 08/06/18 2216 08/07/18 0358 08/07/18 0838  HGB 11.4* 15.0  --   --   --   --   --   --  12.2  --   HCT 40.5 44.0  --   --   --   --   --   --  43.5  --   PLT 194  --   --   --   --   --   --   --  166  --   APTT 34  --   --   --   --  32  --  31  --  31  LABPROT 26.5*  --   --   --   --   --   --   --   --   --   INR 2.46  --   --   --   --   --   --   --   --   --   HEPARINUNFRC  --   --   --   --   --  >2.20* >2.20*  --   --  >2.20*  CREATININE 3.79* 3.70*  --  3.89*  --   --   --   --  3.03*  --   TROPONINI  --   --  0.13* 0.16* 0.16*  --   --   --   --   --     Estimated Creatinine Clearance: 22 mL/min (A) (by C-G formula based on SCr of 3.03 mg/dL (H)).  Assessment: 67 y.o. female with h/o Afib, Eliquis on hold, for heparin  APTT low at 31 on 1300 units/hr CBC stable  Goal of Therapy:  APTT 66-102 Heparin level 0.3-0.7 units/ml Monitor platelets by anticoagulation protocol: Yes   Plan:  Increase Heparin 1500 units/hr Next aPTT 1800  Levester Fresh, PharmD, BCPS, BCCCP Clinical Pharmacist [phone]  Please check AMION for all [place] numbers  08/07/2018 10:36 AM

## 2018-08-07 NOTE — Progress Notes (Signed)
[facility] for heparin Indication: atrial fibrillation  Allergies  Allergen Reactions  . Ace Inhibitors Palpitations    Patient Measurements: Height: 5\' 6"  (167.6 cm) Weight: 236 lb 5.3 oz (107.2 kg) IBW/kg (Calculated) : 59.3 Heparin Dosing Weight: 84 kg  Vital Signs: Temp: 98.4 F (36.9 C) (09/13 2334) Temp Source: Oral (09/13 2334) BP: 98/61 (09/14 0000) Pulse Rate: 48 (09/13 2100)  Labs: Recent Labs    08/05/18 2047 08/05/18 2115 08/05/18 2356 08/06/18 0614 08/06/18 1200 08/06/18 1344 08/06/18 2208 08/06/18 2216  HGB 11.4* 15.0  --   --   --   --   --   --   HCT 40.5 44.0  --   --   --   --   --   --   PLT 194  --   --   --   --   --   --   --   APTT 34  --   --   --   --  32  --  31  LABPROT 26.5*  --   --   --   --   --   --   --   INR 2.46  --   --   --   --   --   --   --   HEPARINUNFRC  --   --   --   --   --  >2.20* >2.20*  --   CREATININE 3.79* 3.70*  --  3.89*  --   --   --   --   TROPONINI  --   --  0.13* 0.16* 0.16*  --   --   --     Estimated Creatinine Clearance: 17.9 mL/min (A) (by C-G formula based on SCr of 3.89 mg/dL (H)).  Assessment: 68 y.o. female with h/o Afib, Eliquis on hold, for heparin  Goal of Therapy:  APTT 66-102 Heparin level 0.3-0.7 units/ml Monitor platelets by anticoagulation protocol: Yes   Plan:  Increase Heparin 1300 units/hr Follow-up am labs.   Phillis Knack, PharmD, BCPS   08/07/2018 12:25 AM

## 2018-08-07 NOTE — Progress Notes (Signed)
[place] Progress Note Patient Name: Patricia Wagner DOB: [birth year] MRN: [id number]   Date of Service  08/07/2018  HPI/Events of Note  Hypokalemia  eICU Interventions  Potassium replaced     Intervention Category Intermediate Interventions: Electrolyte abnormality - evaluation and management  Jomel Whittlesey 08/07/2018, 5:19 AM

## 2018-08-07 NOTE — Progress Notes (Signed)
Bruno for heparin Indication: atrial fibrillation  Allergies  Allergen Reactions  . Ace Inhibitors Palpitations    Patient Measurements: Height: 5\' 6"  (167.6 cm) Weight: 218 lb 7.6 oz (99.1 kg) IBW/kg (Calculated) : 59.3 Heparin Dosing Weight: 84 kg  Vital Signs: Temp: 98 F (36.7 C) (09/14 2000) Temp Source: Oral (09/14 2000) BP: 95/56 (09/14 2200) Pulse Rate: 55 (09/14 2200)  Labs: Recent Labs    08/05/18 2047 08/05/18 2115 08/05/18 2356 08/06/18 0614 08/06/18 1200 08/06/18 1344 08/06/18 2208 08/06/18 2216 08/07/18 0358 08/07/18 0838 08/07/18 2118  HGB 11.4* 15.0  --   --   --   --   --   --  12.2  --   --   HCT 40.5 44.0  --   --   --   --   --   --  43.5  --   --   PLT 194  --   --   --   --   --   --   --  166  --   --   APTT 34  --   --   --   --  32  --  31  --  31 127*  LABPROT 26.5*  --   --   --   --   --   --   --   --   --   --   INR 2.46  --   --   --   --   --   --   --   --   --   --   HEPARINUNFRC  --   --   --   --   --  >2.20* >2.20*  --   --  >2.20*  --   CREATININE 3.79* 3.70*  --  3.89*  --   --   --   --  3.03*  --   --   CKTOTAL  --   --   --   --   --   --   --   --  17*  --   --   TROPONINI  --   --  0.13* 0.16* 0.16*  --   --   --   --   --   --     Estimated Creatinine Clearance: 22 mL/min (A) (by C-G formula based on SCr of 3.03 mg/dL (H)).  Assessment: 67 y.o. female with h/o Afib, Eliquis on hold, for heparin  APTT came back elevated at 127, on 1500 units/hr. Heparin is running into central line and level was drawn from R arm (where infiltrated earlier). No s/sx of bleeding. No infusion issues.   Goal of Therapy:  APTT 66-102 Heparin level 0.3-0.7 units/ml Monitor platelets by anticoagulation protocol: Yes   Plan:  Will recheck aPTT drawn from arm outside where infiltrated previously  No changes at this time   Doylene Canard, PharmD Clinical Pharmacist  Pager: [phone] Phone:  [phone] Please check AMION for all [place] numbers  08/07/2018 10:06 PM

## 2018-08-07 NOTE — Procedures (Signed)
Extubation Procedure Note  Patient Details:   Name: Patricia Wagner DOB: Jun 23, 1951 MRN: [id number]   Airway Documentation:    Vent end date: 08/07/18 Vent end time: 1145   Evaluation  O2 sats: stable throughout Complications: No apparent complications Patient did tolerate procedure well. Bilateral Breath Sounds: Diminished   Yes   RT extubated patient to 4L [name] per order with RN at bedside. Patient had positive cuff leak. Patient tolerated well and no stridor noted. Patient is currently sating 99%. RT worked with patient on incentive spirometer and patient achieved 700. RT will continue to monitor.   Vernona Rieger 08/07/2018, 12:16 PM

## 2018-08-07 NOTE — Consult Note (Addendum)
PULMONARY / CRITICAL CARE MEDICINE   NAME:  SYBELLA HARNISH, MRN:  [id number], DOB:  12-04-1950, LOS: 2 ADMISSION DATE:  08/05/2018, CONSULTATION DATE:  9/13 REFERRING MD:  Bensimohn, CHIEF COMPLAINT:  Acute respiratory failure   BRIEF HISTORY/SIGNIFICANT HOSPITAL EVENTS:    67 year old female w NICM w/ EF 15% further c/b severe RV dysfxn. Admitted on 9/12 w/ dysarthria, lethargy and working dx of acute on chronic biventricular HF, hypoxic resp failure (sats in 70s on room air). Placed on supplemental oxygen, lasix and admitted to SDU. Was in NSR on admit  9/12 to 9/13: remained lethargic, FiO2 requirements increased. ABG obtained w/ PCO2 68, ph 7.24; CXR showing increased pulmonary edema as well as possible retrocardiac infiltrate. Placed on NIPPV. Remained lethargic. Went into AFib. PCCM asked to evaluate.  Ultimately intubated 9/13 for hypoxic respiratory failure / AMS.     SIGNIFICANT PAST MEDICAL HISTORY   NICM EF 15%. Severe RV dysfxn, HTN, CKD stage IV, PAF (on Eliquis)  STUDIES:   CT brain 9/12: negative for acute findings MRI brain 9/13: no acute findings. Small chronic infarcts w/in the right occipital lobe and superior cerebellar   CULTURES:  Respiratory culture 9/13 >> U strep 9/13 >> negative  ANTIBIOTICS:  Unasyn 9/13 >>  LINES/TUBES:  OETT 9/13 >>  CONSULTANTS:  Heart failure team 9/13 >> PCCM 9/13 >>  SUBJECTIVE:  I/O - net 6.7L negative.  Afebrile.  RN reports no acute events.  Pt weaning on PSV 10/5.  Turned to 5/5 0935.  Did not require vasopressors.   CONSTITUTIONAL: BP 96/60 (BP Location: Right Arm)   Pulse (!) 51   Temp 98.2 F (36.8 C) (Axillary)   Resp 16   Ht 5\' 6"  (1.676 m)   Wt 99.1 kg   SpO2 100%   BMI 35.26 kg/m   I/O last 3 completed shifts: In: 1357.5 [I.V.:714.2; NG/GT:493.3; IV Piggyback:150] Out: 1448 [Urine:8223]  CVP:  [11 mmHg-13 mmHg] 11 mmHg  Vent Mode: CPAP;PSV FiO2 (%):  [40 %-50 %] 40 % Set Rate:  [15 bmp] 15 bmp Vt Set:   [400 mL] 400 mL PEEP:  [5 cmH20] 5 cmH20 Pressure Support:  [10 cmH20] 10 cmH20 Plateau Pressure:  [13 cmH20-17 cmH20] 13 cmH20  PHYSICAL EXAM: General:  Ill appearing adult female in NAD lying in bed on vent HEENT: MM pink/moist, ETT Neuro: Awake, alert, communicates appropriately, follows commands, MAE CV: s1s2 rrr, no m/r/g PULM: even/non-labored, lungs bilaterally clear anterior, diminished bases.  Pulling 350-750 on PSV 5/5, normal effort  JE:HUDJ, non-tender, bsx4 active  Extremities: warm/dry, 2+ BLE pitting edema  Skin: no rashes or lesions   RESOLVED PROBLEM LIST    ASSESSMENT AND PLAN    Acute Hypoxic and Hypercarbic Respiratory Failure in setting of acute pulmonary edema, complicated further by what looks like possible aspiration PNA -failed BIPAP 9/13, intubated P: PRVC 8cc/kg  Wean PEEP / FiO2 for sats > 90% Follow intermittent CXR  ABX as above for aspiration.  Consider d/c if afebrile and CXR remains negative 9/15 Diuresis per CHF service   Acute biventricular heart failure (seemingly more RV failure than left currently)  Mild trop I  P: Lasix 40 mg IV Q12 Minimize IVF volume as able  Trend CVP, Co-Ox Rate control AF Discontinue levo from MAR > did not require  PAF. Apparently in past PAF has exacerbated her HF P: ICU monitoring  Amiodarone gtt  Tele monitoring Continue heparin gtt per pharmacy  Hold home eliquis  Acute metabolic encephalopathy 2/2 hypercarbia and hypoxia +/- infection -prior CVA  P: Supportive care  RASS Goal: 0 to -1  Fentanyl PRN for pain   CKD stage IV  - Baseline: 2.37 now up to 3.89 P: Lasix as above  Trend BMP / urinary output Replace electrolytes as indicated Avoid nephrotoxic agents, ensure adequate renal perfusion  Electrolyte Imbalance - Hypokalemia P: Replace as indicated Monitor closely with active diuresis   Mild elevated LFTs - suspect hepatic congestion  P: Monitor      SUMMARY OF TODAY'S PLAN:   ICU support.  Follow SvO2, CVP.  Continue amiodarone / heparin gtts. SBT / WUA as able.   Best Practice / Goals of Care / Disposition.   DVT PROPHYLAXIS: changed from eliquis to heparin on 9/13 SUP:H2B NUTRITION: TF MOBILITY:BR GOALS OF CARE: Full code FAMILY DISCUSSIONS:  Daughter updated at bedside am 9/14.  DISPOSITION  ICU Glucose control: n/a Analgesia/ Sedation: RASS Goal: 0 to -1   LABS  Glucose Recent Labs  Lab 08/06/18 1322 08/06/18 1508 08/06/18 2008 08/06/18 2331 08/07/18 0407 08/07/18 0745  GLUCAP 89 78 104* 123* 122* 127*    BMET Recent Labs  Lab 08/05/18 2047 08/05/18 2115 08/06/18 0614 08/07/18 0358  NA 139 137 141 144  K 3.9 3.7 3.5 2.7*  CL 101 102 102 101  CO2 23  --  27 34*  BUN 65* 62* 64* 60*  CREATININE 3.79* 3.70* 3.89* 3.03*  GLUCOSE 78 72 74 128*    Liver Enzymes Recent Labs  Lab 08/05/18 2047 08/06/18 0614  AST 58* 51*  ALT 92* 84*  ALKPHOS 174* 172*  BILITOT 4.9* 3.7*  ALBUMIN 3.5 3.4*    Electrolytes Recent Labs  Lab 08/05/18 2047 08/06/18 0614 08/06/18 1200 08/06/18 1626 08/07/18 0358  CALCIUM 9.3 8.9  --   --  8.8*  MG  --   --  2.6* 2.6* 2.6*  PHOS  --   --  4.6 4.3 3.5    CBC Recent Labs  Lab 08/05/18 2047 08/05/18 2115 08/07/18 0358  WBC 5.6  --  6.8  HGB 11.4* 15.0 12.2  HCT 40.5 44.0 43.5  PLT 194  --  166    ABG Recent Labs  Lab 08/06/18 0843 08/06/18 1426  PHART 7.235* 7.298*  PCO2ART 67.5* 64.4*  PO2ART 136* 102.0    Coag's Recent Labs  Lab 08/05/18 2047 08/06/18 1344 08/06/18 2216 08/07/18 0838  APTT 34 32 31 31  INR 2.46  --   --   --     Sepsis Markers Recent Labs  Lab 08/06/18 1200  LATICACIDVEN 0.8    Cardiac Enzymes Recent Labs  Lab 08/05/18 2356 08/06/18 0614 08/06/18 1200  TROPONINI 0.13* 0.16* 0.16*   HISTORY OF PRESENT ILLNESS   67 year old female w/ known h/o chronic biventricular HF (EF 15 % and severe RV dysfxn), CKD stage IV, PAF (on eliquis), prior  cardiogenic shock, HTN. Admitted on 9/12 brought in by family who reported waxing/waning MS change demonstrated as: lethargy, slurred speech and progressive shortness of breath. Was hypoxic w sats in 34s on arrival. Had both CT and MRI of brain both which were negative for acute neurological changes. She was admitted to SDU setting w/ supplemental oxygen and diuretics w/ plan to consult Heart failure team. On 9/13 was more lethargic, Ph 7.26 PCO2 64. CXR w/ worsening edema and now retrocardiac infiltrate. Placed on BIPAP w/out improvement in MS. PCCM asked to eval   PAST  MEDICAL HISTORY :   She  has a past medical history of Acute blood loss anemia (03/05/2018), Acute hypercapnic respiratory failure ([place]) (04/30/2017), Acute renal failure superimposed on chronic kidney disease ([place]) (10/20/2016), Arthritis, Atrial fibrillation ([place]), Bradycardia (11/20/2016), CAD in native artery, s/p cardiac cath with non obstructive CAD (10/24/2016), Cerebrovascular accident (CVA) due to embolism of left cerebellar artery ([place]), CHF (congestive heart failure) ([place]), Chronic anticoagulation (03/15/2018), Chronic atrial fibrillation ([place]) (7/93/9030), Chronic systolic (congestive) heart failure ([place]) (03/05/2018), CKD (chronic kidney disease) stage 3, GFR 30-59 ml/min ([place]), Coagulopathy ([place]) (03/05/2018), DIVERTICULITIS, HX OF (07/25/2007), Diverticulosis of colon with hemorrhage (07/22/2007), DIVERTICULOSIS, COLON (07/22/2007), Dyspnea, Dysuria (07/21/2017), Edema, peripheral, Essential hypertension (07/22/2007), HLD (hyperlipidemia) (02/03/2008), HYPERLIPIDEMIA (02/03/2008), Hypersomnia, Hypertension, Hypotension (arterial) (04/30/2017), MENOPAUSAL DISORDER (01/09/2011), Morbid obesity ([place]) (07/22/2007), NICM (nonischemic cardiomyopathy) ([place]) (10/24/2016), PAF (paroxysmal atrial fibrillation) ([place]), Raynaud's syndrome (07/22/2007), Stroke ([place]) (2017), Suspected sleep apnea (05/20/2017), THYROID NODULE, RIGHT (01/04/2010), and VITAMIN D  DEFICIENCY (01/09/2011).  PAST SURGICAL HISTORY:  She  has a past surgical history that includes Partial hysterectomy; Cardiac catheterization (N/A, 10/23/2016); Cardioversion (N/A, 10/31/2016); TEE without cardioversion (N/A, 10/31/2016); Cardioversion (N/A, 11/03/2016); Cardioversion (N/A, 11/18/2016); Cardioversion (N/A, 03/25/2017); TEE without cardioversion (N/A, 03/25/2017); Cardioversion (N/A, 05/06/2017); Cardioversion (N/A, 05/12/2017); Colonoscopy w/ polypectomy (02/2011); and Colonoscopy (N/A, 12/04/2017).  Allergies  Allergen Reactions  . Ace Inhibitors Palpitations    No current facility-administered medications on file prior to encounter.    Current Outpatient Medications on File Prior to Encounter  Medication Sig  . amiodarone (PACERONE) 200 MG tablet Take 1 tablet (200 mg total) by mouth daily.  Marland Kitchen apixaban (ELIQUIS) 5 MG TABS tablet Take 1 tablet (5 mg total) by mouth 2 (two) times daily.  . carvedilol (COREG) 3.125 MG tablet TAKE 1 TABLET (3.125 MG TOTAL) BY MOUTH 2 (TWO) TIMES DAILY.  Marland Kitchen epoetin alfa (EPOGEN,PROCRIT) 09233 UNIT/ML injection Inject 5,000 Units into the skin once a week.  . furosemide (LASIX) 20 MG tablet Take 2 tablets (40 mg total) by mouth daily.  . hydrALAZINE (APRESOLINE) 50 MG tablet Take 1 tablet (50 mg total) by mouth 3 (three) times daily.  . Investigational - Study Medication Take 1 tablet by mouth 2 (two) times daily. Study name: Galactic HF Study  Additional study details: Omecamtiv Mecarbil or Placebo  . isosorbide mononitrate (IMDUR) 60 MG 24 hr tablet Take 1 tablet (60 mg total) by mouth daily.  . metolazone (ZAROXOLYN) 2.5 MG tablet Take 1 tablet (2.5 mg total) by mouth as needed (swelling/ wt gain). Take 1 Tablet as directed by the CHF Clinic  . potassium chloride SA (K-DUR,KLOR-CON) 20 MEQ tablet Take 1 tablet (20 mEq total) by mouth daily. Take 4 tabs first day, then take 2 tabs daily  . ranolazine (RANEXA) 500 MG 12 hr tablet TAKE 1 TABLET BY MOUTH  TWICE A DAY  . rosuvastatin (CRESTOR) 10 MG tablet Take 10 mg by mouth daily.    FAMILY HISTORY:   Her family history includes Asthma in her mother; Diabetes in her father; Heart disease in her father and sister; Lung disease in her sister. There is no history of Thyroid disease.  SOCIAL HISTORY:  She  reports that she has quit smoking. Her smoking use included cigarettes. She started smoking about 8 months ago. She smoked 0.50 packs per day. She has never used smokeless tobacco. She reports that she does not drink alcohol or use drugs.  REVIEW OF SYSTEMS:    Unattainable.  Attending Note:  67 year old female with  heart failure presenting with acute respiratory failure due to pulmonary edema and heart failure.  On exam, diffuse crackles noted.  I reviewed CXR myself, pulmonary edema noted.  Dicussed with PCCM-NP and advanced heart failure team.  Transfer to the ICU, intubated, place TLC, begin active diureses and pressors for BP support.  Follow CVP and SvO2.  Check ABG and adjust vent accordingly.  CXR post intubation and line placement.  Place A-line if BP becomes questionable.  Appreciate input from advanced heart failure team.  Monitor renal function closely.  Replace electerolytes.  PCCM will continue to follow.  The patient is critically ill with multiple organ systems failure and requires high complexity decision making for assessment and support, frequent evaluation and titration of therapies, application of advanced monitoring technologies and extensive interpretation of multiple databases.   Critical Care Time devoted to patient care services described in this note is  45  Minutes. This time reflects time of care of this signee Dr Jennet Maduro. This critical care time does not reflect procedure time, or teaching time or supervisory time of PA/NP/Med student/Med Resident etc but could involve care discussion time.  [name], M.D. [facility] Pulmonary/Critical Care Medicine. Pager:  [phone]. After hours pager: [phone].

## 2018-08-08 ENCOUNTER — Inpatient Hospital Stay (HOSPITAL_COMMUNITY): Payer: Medicare HMO

## 2018-08-08 LAB — BASIC METABOLIC PANEL
Anion gap: 9 (ref 5–15)
BUN: 54 mg/dL — AB (ref 8–23)
CO2: 35 mmol/L — ABNORMAL HIGH (ref 22–32)
Calcium: 8.3 mg/dL — ABNORMAL LOW (ref 8.9–10.3)
Chloride: 101 mmol/L (ref 98–111)
Creatinine, Ser: 2.65 mg/dL — ABNORMAL HIGH (ref 0.44–1.00)
GFR calc Af Amer: 21 mL/min — ABNORMAL LOW (ref >60)
GFR, EST NON AFRICAN AMERICAN: 18 mL/min — AB (ref >60)
Glucose, Bld: 113 mg/dL — ABNORMAL HIGH (ref 70–99)
POTASSIUM: 3.1 mmol/L — AB (ref 3.5–5.1)
SODIUM: 145 mmol/L (ref 135–145)

## 2018-08-08 LAB — GLUCOSE, CAPILLARY
GLUCOSE-CAPILLARY: 88 mg/dL (ref 70–99)
Glucose-Capillary: 131 mg/dL — ABNORMAL HIGH (ref 70–99)
Glucose-Capillary: 109 mg/dL — ABNORMAL HIGH (ref 70–99)
Glucose-Capillary: 101 mg/dL — ABNORMAL HIGH (ref 70–99)
Glucose-Capillary: 150 mg/dL — ABNORMAL HIGH (ref 70–99)
Glucose-Capillary: 133 mg/dL — ABNORMAL HIGH (ref 70–99)

## 2018-08-08 LAB — COOXEMETRY PANEL
Carboxyhemoglobin: 1.8 % — ABNORMAL HIGH (ref 0.5–1.5)
METHEMOGLOBIN: 0.9 % (ref 0.0–1.5)
O2 Saturation: 49 %
TOTAL HEMOGLOBIN: 11.8 g/dL — AB (ref 12.0–16.0)

## 2018-08-08 LAB — CBC
HEMATOCRIT: 41.6 % (ref 36.0–46.0)
Hemoglobin: 11.5 g/dL — ABNORMAL LOW (ref 12.0–15.0)
MCH: 23 pg — AB (ref 26.0–34.0)
MCHC: 27.6 g/dL — AB (ref 30.0–36.0)
MCV: 83.4 fL (ref 78.0–100.0)
Platelets: 138 10*3/uL — ABNORMAL LOW (ref 150–400)
RBC: 4.99 MIL/uL (ref 3.87–5.11)
RDW: 23.5 % — AB (ref 11.5–15.5)
WBC: 7.3 10*3/uL (ref 4.0–10.5)

## 2018-08-08 LAB — APTT: aPTT: 96 seconds — ABNORMAL HIGH (ref 24–36)

## 2018-08-08 MED ORDER — POTASSIUM CHLORIDE 20 MEQ/15ML (10%) PO SOLN
40.0000 meq | Freq: Once | ORAL | Status: AC
  Administered 2018-08-08: 40 meq via ORAL
  Filled 2018-08-08: qty 30

## 2018-08-08 MED ORDER — POTASSIUM CHLORIDE 20 MEQ/15ML (10%) PO SOLN: 40.0000 meq | Freq: Once | ORAL | Status: DC

## 2018-08-08 NOTE — Evaluation (Signed)
Physical Therapy Evaluation Patient Details Name: Patricia Wagner MRN: [id number] DOB: Nov 01, 1951 Today's Date: 08/08/2018   History of Present Illness  67 y.o. female with acute respiratory failure secondary to acute on chronic biventricular heart failure. VDRF, extubated 9/14;  has a pertinent past medical history including but not limited to Acute hypercapnic respiratory failure ([place]) (04/30/2017), Acute renal failure superimposed on chronic kidney disease ([place]) (10/20/2016), Arthritis, Atrial fibrillation ([place]), Bradycardia (11/20/2016), , Cerebrovascular accident (CVA) due to embolism of left cerebellar artery ([place]),  Chronic anticoagulation (03/15/2018), Chronic atrial fibrillation ([place]) (7/82/4235), Chronic systolic (congestive) heart failure ([place]) (03/05/2018), CKD (chronic kidney disease) stage 3, Hypersomnia, Hypertension  Clinical Impression   Pt admitted with above diagnosis. Pt currently with functional limitations due to the deficits listed below (see PT Problem List). Independent prior to admission, using cane prn for amb; Presents with decr functional mobility, tachycardic response to incr activity, decr activity tolerance;  Pt will benefit from skilled PT to increase their independence and safety with mobility to allow discharge to the venue listed below.       Follow Up Recommendations Home health PT;Outpatient PT(Lives alone, and may benefit from HHPT for home safety eval -- still, will monitor progress -- she currently goes to Outpt PT for her L knee and reports enjoying Outpt PT)    Equipment Recommendations  Other (comment)(Consider a Rollator RW)    Recommendations for Other Services       Precautions / Restrictions Precautions Precautions: Fall      Mobility  Bed Mobility Overal bed mobility: Needs Assistance Bed Mobility: Supine to Sit     Supine to sit: Mod assist     General bed mobility comments: Light mod assist to pull to sit  Transfers Overall transfer  level: Needs assistance Equipment used: Straight cane Transfers: Sit to/from Stand Sit to Stand: Min guard         General transfer comment: [place] for safety; slow moving, but stood without physical assist  Ambulation/Gait Ambulation/Gait assistance: Min guard;+2 safety/equipment Gait Distance (Feet): 50 Feet Assistive device: Straight cane;IV Pole;Rolling walker (2 wheeled) Gait Pattern/deviations: Step-to pattern;Decreased step length - right;Decreased step length - left;Decreased stride length     General Gait Details: Unsteady while using cane and IV pole for support; switched to RW with some better success; cues for RW proximity and posture; Ms. Endres does not like the RW, but she is curious about a Research scientist (life sciences) [place] (Stroke Patients Only)       Balance                                             Pertinent Vitals/Pain Pain Assessment: Faces Faces Pain Scale: Hurts little more Pain Location: L knee with amb Pain Descriptors / Indicators: Aching Pain Intervention(s): Monitored during session;Other (comment)(suggested RW to unweigh painful L knee in stance)    Home Living Family/patient expects to be discharged to:: Private residence Living Arrangements: Alone Available Help at Discharge: Family;Available PRN/intermittently Type of Home: Apartment Home Access: Level entry     Home Layout: One level Home Equipment: Cane - single point      Prior Function Level of Independence: Independent         Comments: uses cane due to "bad L knee"  Hand Dominance   Dominant Hand: Right    Extremity/Trunk Assessment   Upper Extremity Assessment Upper Extremity Assessment: Generalized weakness    Lower Extremity Assessment Lower Extremity Assessment: Generalized weakness;LLE deficits/detail LLE Deficits / Details: L knee pain with weight bearing; tells me she goes to Outpt PT for  her L knee; knee ROM grossly WFL for simple, uncomplicated amb       Communication   Communication: Other (comment)(at times difficult to understand)  Cognition Arousal/Alertness: Awake/alert Behavior During Therapy: WFL for tasks assessed/performed Overall Cognitive Status: Within Functional Limits for tasks assessed                                        General Comments General comments (skin integrity, edema, etc.): HR incr to in the 170s with activity, RN aware; eased back down to 60with seated rest; BP 105/74    Exercises     Assessment/Plan    PT Assessment Patient needs continued PT services  PT Problem List Decreased strength;Decreased range of motion;Decreased activity tolerance;Decreased balance;Decreased mobility;Decreased knowledge of use of DME;Decreased safety awareness;Decreased knowledge of precautions;Cardiopulmonary status limiting activity       PT Treatment Interventions DME instruction;Gait training;Functional mobility training;Therapeutic activities;Therapeutic exercise;Balance training;Patient/family education    PT Goals (Current goals can be found in the Care Plan section)  Acute Rehab PT Goals Patient Stated Goal: hopes to get home soon PT Goal Formulation: With patient Time For Goal Achievement: 08/22/18 Potential to Achieve Goals: Fair    Frequency Min 3X/week   Barriers to discharge Decreased caregiver support Lives alone; daughter can help some    Co-evaluation               AM-PAC PT "6 Clicks" Daily Activity  Outcome Measure Difficulty turning over in bed (including adjusting bedclothes, sheets and blankets)?: A Little Difficulty moving from lying on back to sitting on the side of the bed? : A Lot Difficulty sitting down on and standing up from a chair with arms (e.g., wheelchair, bedside commode, etc,.)?: A Little Help needed moving to and from a bed to chair (including a wheelchair)?: A Little Help needed walking  in hospital room?: A Little Help needed climbing 3-5 steps with a railing? : A Lot 6 Click Score: 16    End of Session Equipment Utilized During Treatment: Gait belt Activity Tolerance: Patient tolerated treatment well Patient left: in chair;with call bell/phone within reach;with chair alarm set Nurse Communication: Mobility status;Other (comment)(needs pulse oximetry monitor) PT Visit Diagnosis: Unsteadiness on feet (R26.81);Other abnormalities of gait and mobility (R26.89);Muscle weakness (generalized) (M62.81)    Time: 2902-1115 PT Time Calculation (min) (ACUTE ONLY): 33 min   Charges:   PT Evaluation $PT Eval Moderate Complexity: 1 Mod PT Treatments $Gait Training: 8-22 mins        Roney Marion, PT  Acute Rehabilitation Services Pager [phone] Office [place] 08/08/2018, 1:25 PM

## 2018-08-08 NOTE — Progress Notes (Signed)
Patricia Wagner  ZYY:[id number] DOB: May 02, 1951 DOA: 08/05/2018 PCP: Biagio Borg, MD    LOS: 3 days   Reason for Consult / Chief Complaint:  dyspnea  Consulting MD and date:  Cardiology, Bensimohn  HPI/Summary of hospital stay:  67 y/o female with a history of systolic heart failure developed acute respiratory failure with hypoxemia due to a systolic heart failure exacerbation.   Subjective:  Extubated on 9/15, tolerated well Tachycardia with PT today, resolved with exertion  Objective   Blood pressure 90/64, pulse (!) 59, temperature 98.7 F (37.1 C), temperature source Oral, resp. rate (!) 21, height 5\' 6"  (1.676 m), weight 97.5 kg, SpO2 100 %. CVP:  [11 mmHg] 11 mmHg      Intake/Output Summary (Last 24 hours) at 08/08/2018 1149 Last data filed at 08/08/2018 1000 Gross per 24 hour  Intake 967.12 ml  Output 2350 ml  Net -1382.88 ml   Filed Weights   08/06/18 0515 08/07/18 0428 08/08/18 0352  Weight: 107.2 kg 99.1 kg 97.5 kg    Examination: General:  Resting comfortably in chair HENT: NCAT OP clear PULM: CTA B, normal effort CV: RRR, no mgr GI: BS+, soft, nontender MSK: normal bulk and tone Neuro: awake, alert, no distress, MAEW   SIGNIFICANT PAST MEDICAL HISTORY   NICM EF 15%. Severe RV dysfxn, HTN, CKD stage IV, PAF (on Eliquis)  STUDIES:   CT brain 9/12: negative for acute findings MRI brain 9/13: no acute findings. Small chronic infarcts w/in the right occipital lobe and superior cerebellar   CULTURES:  Respiratory culture 9/13 >> U strep 9/13 >> negative  ANTIBIOTICS:  Unasyn 9/13 >>  LINES/TUBES:  OETT 9/13 >>  CONSULTANTS:  Heart failure team 9/13 >> PCCM 9/13 >>  Resolved Hospital Problem list    Assessment & Plan:  Acute respiratory failure with hypoxemia from pulmonary edema from acute decompensated heart failure > continue diuresis today > monitor O2 saturation > mobilize  Atrial fibrillation: > continue amiodarone >  continue heparin infusion  Acute on chronic kidney failure: better Monitor BMET and UOP Replace electrolytes as needed  Hypokalemia replace  Disposition / Summary of Today's Plan 08/08/18   Move to med surg floor    DVT prophylaxis: heparin GI prophylaxis: n/a Diet: cardiac prudent Mobility:PT consult Code Status: DNR Family Communication: none bedside  Labs   CBC: Recent Labs  Lab 08/03/18 1302 08/05/18 2047 08/05/18 2115 08/07/18 0358 08/08/18 0339  WBC  --  5.6  --  6.8 7.3  NEUTROABS  --  4.3  --   --   --   HGB 11.2* 11.4* 15.0 12.2 11.5*  HCT  --  40.5 44.0 43.5 41.6  MCV  --  83.0  --  82.4 83.4  PLT  --  194  --  166 488*   Basic Metabolic Panel: Recent Labs  Lab 08/05/18 2047 08/05/18 2115 08/06/18 0614 08/06/18 1200 08/06/18 1626 08/07/18 0358 08/08/18 0339  NA 139 137 141  --   --  144 145  K 3.9 3.7 3.5  --   --  2.7* 3.1*  CL 101 102 102  --   --  101 101  CO2 23  --  27  --   --  34* 35*  GLUCOSE 78 72 74  --   --  128* 113*  BUN 65* 62* 64*  --   --  60* 54*  CREATININE 3.79* 3.70* 3.89*  --   --  3.03* 2.65*  CALCIUM 9.3  --  8.9  --   --  8.8* 8.3*  MG  --   --   --  2.6* 2.6* 2.6*  --   PHOS  --   --   --  4.6 4.3 3.5  --    GFR: Estimated Creatinine Clearance: 24.9 mL/min (A) (by C-G formula based on SCr of 2.65 mg/dL (H)). Recent Labs  Lab 08/05/18 2047 08/06/18 1200 08/07/18 0358 08/08/18 0339  WBC 5.6  --  6.8 7.3  LATICACIDVEN  --  0.8  --   --    Liver Function Tests: Recent Labs  Lab 08/05/18 2047 08/06/18 0614  AST 58* 51*  ALT 92* 84*  ALKPHOS 174* 172*  BILITOT 4.9* 3.7*  PROT 6.6 6.4*  ALBUMIN 3.5 3.4*   Recent Labs  Lab 08/05/18 2356  LIPASE 23   No results for input(s): AMMONIA in the last 168 hours. ABG    Component Value Date/Time   PHART 7.298 (L) 08/06/2018 1426   PCO2ART 64.4 (H) 08/06/2018 1426   PO2ART 102.0 08/06/2018 1426   HCO3 31.6 (H) 08/06/2018 1426   TCO2 33 (H) 08/06/2018 1426    ACIDBASEDEF 1.0 04/29/2017 1330   O2SAT 49.0 08/08/2018 0340    Coagulation Profile: Recent Labs  Lab 08/05/18 2047  INR 2.46   Cardiac Enzymes: Recent Labs  Lab 08/05/18 2356 08/06/18 0614 08/06/18 1200 08/07/18 0358  CKTOTAL  --   --   --  17*  TROPONINI 0.13* 0.16* 0.16*  --    HbA1C: Hgb A1c MFr Bld  Date/Time Value Ref Range Status  08/06/2018 06:14 AM 6.4 (H) 4.8 - 5.6 % Final    Comment:    (NOTE) Pre diabetes:          5.7%-6.4% Diabetes:              >6.4% Glycemic control for   <7.0% adults with diabetes   11/21/2016 05:41 AM 6.3 (H) 4.8 - 5.6 % Final    Comment:    (NOTE)         Pre-diabetes: 5.7 - 6.4         Diabetes: >6.4         Glycemic control for adults with diabetes: <7.0    CBG: Recent Labs  Lab 08/07/18 2033 08/08/18 0101 08/08/18 0427 08/08/18 0748 08/08/18 1146  GLUCAP 131* 109* 101* 88 150*     Review of Systems:    Past medical history  She,  has a past medical history of Acute blood loss anemia (03/05/2018), Acute hypercapnic respiratory failure ([place]) (04/30/2017), Acute renal failure superimposed on chronic kidney disease ([place]) (10/20/2016), Arthritis, Atrial fibrillation ([place]), Bradycardia (11/20/2016), CAD in native artery, s/p cardiac cath with non obstructive CAD (10/24/2016), Cerebrovascular accident (CVA) due to embolism of left cerebellar artery ([place]), CHF (congestive heart failure) ([place]), Chronic anticoagulation (03/15/2018), Chronic atrial fibrillation ([place]) (7/98/9211), Chronic systolic (congestive) heart failure ([place]) (03/05/2018), CKD (chronic kidney disease) stage 3, GFR 30-59 ml/min ([place]), Coagulopathy ([place]) (03/05/2018), DIVERTICULITIS, HX OF (07/25/2007), Diverticulosis of colon with hemorrhage (07/22/2007), DIVERTICULOSIS, COLON (07/22/2007), Dyspnea, Dysuria (07/21/2017), Edema, peripheral, Essential hypertension (07/22/2007), HLD (hyperlipidemia) (02/03/2008), HYPERLIPIDEMIA (02/03/2008), Hypersomnia, Hypertension, Hypotension  (arterial) (04/30/2017), MENOPAUSAL DISORDER (01/09/2011), Morbid obesity ([place]) (07/22/2007), NICM (nonischemic cardiomyopathy) ([place]) (10/24/2016), PAF (paroxysmal atrial fibrillation) ([place]), Raynaud's syndrome (07/22/2007), Stroke ([place]) (2017), Suspected sleep apnea (05/20/2017), THYROID NODULE, RIGHT (01/04/2010), and VITAMIN D DEFICIENCY (01/09/2011).   Surgical History    Past Surgical History:  Procedure Laterality Date  . CARDIAC CATHETERIZATION N/A 10/23/2016   Procedure: Left Heart Cath and Coronary Angiography;  Surgeon: [name], MD;  Location: [facility];  Service: Cardiovascular;  Laterality: N/A;  . CARDIOVERSION N/A 10/31/2016   Procedure: CARDIOVERSION;  Surgeon: [name], MD;  Location: [facility];  Service: Cardiovascular;  Laterality: N/A;  . CARDIOVERSION N/A 11/03/2016   Procedure: CARDIOVERSION;  Surgeon: [name], MD;  Location: [facility] [place];  Service: Cardiovascular;  Laterality: N/A;  . CARDIOVERSION N/A 11/18/2016   Procedure: CARDIOVERSION;  Surgeon: [name], MD;  Location: [facility];  Service: Cardiovascular;  Laterality: N/A;  . CARDIOVERSION N/A 03/25/2017   Procedure: CARDIOVERSION;  Surgeon: [name], MD;  Location: [facility] ENDOSCOPY;  Service: Cardiovascular;  Laterality: N/A;  . CARDIOVERSION N/A 05/06/2017   Procedure: CARDIOVERSION;  Surgeon: [name], MD;  Location: [facility] ENDOSCOPY;  Service: Cardiovascular;  Laterality: N/A;  . CARDIOVERSION N/A 05/12/2017   Procedure: CARDIOVERSION;  Surgeon: [name], MD;  Location: [facility] ENDOSCOPY;  Service: Cardiovascular;  Laterality: N/A;  . COLONOSCOPY N/A 12/04/2017   Procedure: COLONOSCOPY;  Surgeon: [name], MD;  Location: [place];  Service: Endoscopy;  Laterality: N/A;  . COLONOSCOPY W/ POLYPECTOMY  02/2011   pan diverticulosis.  tubular adenoma without dysplasia on 5 mm sigmoid polyp.  Dr Fuller Plan.    Marland Kitchen PARTIAL HYSTERECTOMY     1 OVARY LEFT  . TEE WITHOUT  CARDIOVERSION N/A 10/31/2016   Procedure: TRANSESOPHAGEAL ECHOCARDIOGRAM (TEE);  Surgeon: [name], MD;  Location: [facility] ENDOSCOPY;  Service: Cardiovascular;  Laterality: N/A;  . TEE WITHOUT CARDIOVERSION N/A 03/25/2017   Procedure: TRANSESOPHAGEAL ECHOCARDIOGRAM (TEE);  Surgeon: [name], MD;  Location: [facility] ENDOSCOPY;  Service: Cardiovascular;  Laterality: N/A;     Social History   Social History   Socioeconomic History  . Marital status: Single    Spouse name: Not on file  . Number of children: 1  . Years of education: Not on file  . Highest education level: Not on file  Occupational History  . Occupation: Engineer, manufacturing  . Financial resource strain: Not on file  . Food insecurity:    Worry: Not on file    Inability: Not on file  . Transportation needs:    Medical: Not on file    Non-medical: Not on file  Tobacco Use  . Smoking status: Former Smoker    Packs/day: 0.50    Types: Cigarettes    Start date: 2019  . Smokeless tobacco: Never Used  . Tobacco comment: restarted back this year  Substance and Sexual Activity  . Alcohol use: No    Alcohol/week: 4.0 standard drinks    Types: 4 Cans of beer per week    Frequency: Never    Comment: Intermittent  . Drug use: No  . Sexual activity: Not on file  Lifestyle  . Physical activity:    Days per week: Not on file    Minutes per session: Not on file  . Stress: Not on file  Relationships  . Social connections:    Talks on phone: Not on file    Gets together: Not on file    Attends religious service: Not on file    Active member of club or organization: Not on file    Attends meetings of clubs or organizations: Not on file    Relationship status: Not on file  . Intimate partner violence:  Fear of current or ex partner: Not on file    Emotionally abused: Not on file    Physically abused: Not on file    Forced sexual activity: Not on file  Other Topics Concern  . Not on file  Social History Narrative   . Not on file  ,  reports that she has quit smoking. Her smoking use included cigarettes. She started smoking about 8 months ago. She smoked 0.50 packs per day. She has never used smokeless tobacco. She reports that she does not drink alcohol or use drugs.   Family history   Her family history includes Asthma in her mother; Diabetes in her father; Heart disease in her father and sister; Lung disease in her sister. There is no history of Thyroid disease.   Allergies Allergies  Allergen Reactions  . Ace Inhibitors Palpitations    Home meds  Prior to Admission medications   Medication Sig Start Date End Date Taking? Authorizing Provider  amiodarone (PACERONE) 200 MG tablet Take 1 tablet (200 mg total) by mouth daily. 03/11/18  Yes Amin, Jeanella Flattery, MD  apixaban (ELIQUIS) 5 MG TABS tablet Take 1 tablet (5 mg total) by mouth 2 (two) times daily. 04/15/18  Yes Georgiana Shore, NP  carvedilol (COREG) 3.125 MG tablet TAKE 1 TABLET (3.125 MG TOTAL) BY MOUTH 2 (TWO) TIMES DAILY. 07/02/18 09/30/18 Yes [name], MD  epoetin alfa (EPOGEN,PROCRIT) 29924 UNIT/ML injection Inject 5,000 Units into the skin once a week.   Yes [provider]  furosemide (LASIX) 20 MG tablet Take 2 tablets (40 mg total) by mouth daily. 06/24/18  Yes Bensimhon, Shaune Pascal, MD  hydrALAZINE (APRESOLINE) 50 MG tablet Take 1 tablet (50 mg total) by mouth 3 (three) times daily. 04/15/18 08/07/18 Yes Georgiana Shore, NP  isosorbide mononitrate (IMDUR) 60 MG 24 hr tablet Take 1 tablet (60 mg total) by mouth daily. 04/15/18 08/07/18 Yes Georgiana Shore, NP  ranolazine (RANEXA) 500 MG 12 hr tablet TAKE 1 TABLET BY MOUTH TWICE A DAY Patient taking differently: Take 500 mg by mouth 2 (two) times daily.  03/16/18  Yes Bensimhon, Shaune Pascal, MD  rosuvastatin (CRESTOR) 10 MG tablet Take 10 mg by mouth daily.   Yes [provider]  Investigational - Study Medication Take 1 tablet by mouth 2 (two) times daily. Study name:  Galactic HF Study  Additional study details: Omecamtiv Mecarbil or Placebo Patient not taking: Reported on 08/07/2018 03/18/18   Larey Dresser, MD

## 2018-08-08 NOTE — Progress Notes (Addendum)
Progress Note  Patient Name: Patricia Wagner Date of Encounter: 08/08/2018  Primary Cardiologist: Glori Bickers, MD   Subjective   Extubated on 9/14, no significant chest pain or shortness of breath.  Was able to ambulate with physical therapy, tachycardia noted but this resolved (looks like transient atrial flutter).  Continued hypotension.  Inpatient Medications    Scheduled Meds: . amiodarone  150 mg Intravenous Once  . furosemide  40 mg Intravenous Q12H  . sodium chloride flush  3 mL Intravenous Q12H   Continuous Infusions: . sodium chloride    . amiodarone 30 mg/hr (08/08/18 1000)  . heparin 1,300 Units/hr (08/08/18 1000)   PRN Meds: sodium chloride, fentaNYL (SUBLIMAZE) injection, sodium chloride flush   Vital Signs    Vitals:   08/08/18 0800 08/08/18 0900 08/08/18 1000 08/08/18 1148  BP: 97/62 (!) 98/59 90/64   Pulse: (!) 57 60 (!) 59   Resp: (!) 21 (!) 22 (!) 21   Temp:    98.7 F (37.1 C)  TempSrc:    Oral  SpO2: 100% 100% 100%   Weight:      Height:        Intake/Output Summary (Last 24 hours) at 08/08/2018 1239 Last data filed at 08/08/2018 1000 Gross per 24 hour  Intake 886.03 ml  Output 2050 ml  Net -1163.97 ml   Filed Weights   08/06/18 0515 08/07/18 0428 08/08/18 0352  Weight: 107.2 kg 99.1 kg 97.5 kg    Telemetry    Sinus bradycardia heart rate in the 50s- Personally Reviewed  ECG    08/06/2018 showed atrial fibrillation/flutter- Personally Reviewed  Physical Exam   GEN: Sitting in chair, arousable, in no acute distress  HEENT: normal, RIJ.  Neck: mid neck JVD, no carotid bruits, or masses Cardiac: Loletha Grayer reg; no murmurs, rubs, or gallops, 2+ BLE edema  Respiratory:  clear to auscultation bilaterally, normal work of breathing GI: soft, nontender, nondistended, + BS MS: no deformity or atrophy  Skin: warm and dry, no rash Neuro:  Alert and Oriented x 3, Strength and sensation are intact Psych: euthymic mood, full affect   Labs      Chemistry Recent Labs  Lab 08/05/18 2047  08/06/18 0614 08/07/18 0358 08/08/18 0339  NA 139   < > 141 144 145  K 3.9   < > 3.5 2.7* 3.1*  CL 101   < > 102 101 101  CO2 23  --  27 34* 35*  GLUCOSE 78   < > 74 128* 113*  BUN 65*   < > 64* 60* 54*  CREATININE 3.79*   < > 3.89* 3.03* 2.65*  CALCIUM 9.3  --  8.9 8.8* 8.3*  PROT 6.6  --  6.4*  --   --   ALBUMIN 3.5  --  3.4*  --   --   AST 58*  --  51*  --   --   ALT 92*  --  84*  --   --   ALKPHOS 174*  --  172*  --   --   BILITOT 4.9*  --  3.7*  --   --   GFRNONAA 12*  --  11* 15* 18*  GFRAA 13*  --  13* 18* 21*  ANIONGAP 15  --  12 9 9    < > = values in this interval not displayed.     Hematology Recent Labs  Lab 08/05/18 2047 08/05/18 2115 08/07/18 0358 08/08/18 0339  WBC 5.6  --  6.8 7.3  RBC 4.88  --  5.28* 4.99  HGB 11.4* 15.0 12.2 11.5*  HCT 40.5 44.0 43.5 41.6  MCV 83.0  --  82.4 83.4  MCH 23.4*  --  23.1* 23.0*  MCHC 28.1*  --  28.0* 27.6*  RDW 23.9*  --  23.7* 23.5*  PLT 194  --  166 138*    Cardiac Enzymes Recent Labs  Lab 08/05/18 2356 08/06/18 0614 08/06/18 1200  TROPONINI 0.13* 0.16* 0.16*    Recent Labs  Lab 08/05/18 2055  TROPIPOC 0.16*     BNP Recent Labs  Lab 08/05/18 2155  BNP 3,951.4*     DDimer No results for input(s): DDIMER in the last 168 hours.   Radiology    Dg Chest 1 View  Result Date: 08/06/2018 CLINICAL DATA:  Central line placement. EXAM: CHEST  1 VIEW COMPARISON:  Earlier today. FINDINGS: Interval right jugular catheter with its tip in the superior vena cava. No pneumothorax. Interval endotracheal tube in satisfactory position. Interval nasogastric tube extending into the stomach. Stable enlarged cardiac silhouette. Decreased retrocardiac opacity on the left. Stable prominence of the pulmonary vasculature and interstitial markings. No pleural fluid seen. Thoracic spine degenerative changes. IMPRESSION: 1. Right jugular catheter tip in the superior vena cava  without pneumothorax. 2. Decreased left lower lobe atelectasis or pneumonia. 3. Stable cardiomegaly and mild changes of congestive heart failure. Electronically Signed   By: Claudie Revering M.D.   On: 08/06/2018 13:08   Dg Abd 1 View  Result Date: 08/06/2018 CLINICAL DATA:  Orogastric tube placement. EXAM: ABDOMEN - 1 VIEW COMPARISON:  12/02/2017 FINDINGS: Orogastric tube extends into the abdomen. The tip is in the right abdomen and probably in the proximal duodenum. Cholecystectomy clips in the right upper abdomen. IMPRESSION: Orogastric tube tip in the region of the proximal duodenum. Electronically Signed   By: Markus Daft M.D.   On: 08/06/2018 13:19   Dg Chest Port 1 View  Result Date: 08/08/2018 CLINICAL DATA:  Hypoxia EXAM: PORTABLE CHEST 1 VIEW COMPARISON:  08/07/2018 FINDINGS: Cardiac shadow is again enlarged. Aortic calcifications are again seen. Right jugular central line is again noted and stable. The lungs are well aerated bilaterally. Resolution of previously seen vascular congestion is noted. No focal infiltrate is noted. IMPRESSION: No acute abnormality seen. Electronically Signed   By: Inez Catalina M.D.   On: 08/08/2018 07:43   Portable Chest Xray  Result Date: 08/07/2018 CLINICAL DATA:  Aspiration pneumonia EXAM: PORTABLE CHEST 1 VIEW COMPARISON:  08/06/2018 FINDINGS: Cardiac shadow is again enlarged. The endotracheal tube, right jugular central line and nasogastric catheter are again seen in satisfactory position. The lungs are well aerated bilaterally. Stable vascular congestion is noted. No focal confluent infiltrate is seen. IMPRESSION: Stable vascular congestion without focal infiltrate. Electronically Signed   By: Inez Catalina M.D.   On: 08/07/2018 07:19    Cardiac Studies    Echo on 03/25/2017 EF 15% with severely reduced right ventricular function.  Prior EF 40 to 45%    Patient Profile     67 y.o. female with acute respiratory failure secondary to acute on chronic  biventricular heart failure.  Assessment & Plan    Acute hypoxic respiratory failure/acute on chronic biventricular heart failure - Intubated on 9/13 -extubated 9/14, secondary to cardiomyopathy -Continuing with Lasix 40 mg IV twice daily. - Out net1.7 L yesterday, on 9/13 5.5 L out.  Excellent.  Weight 97.5 kg down from 107 kg At this point holding on  carvedilol, isosorbide, hydralazine, spironolactone, Entresto.  She is not requiring pressor support at this time although blood pressures still remain soft.  Off of antibiotics.  Persistent atrial fibrillation - Post cardioversion on 5/2, stopped amiodarone after this, cardioverted again twice in June.  Felt that she cannot tolerate atrial fibrillation with her current cardiogenic shock.  Currently maintaining sinus bradycardia. -Amiodarone drip was started 9/13, Ranexa was continued 500 mg twice a day, anticoagulation continued. -Currently holding off on ablation therapy by Dr. Curt Bears and she needs lumpectomy.  We will transition back to p.o. Amiodarone soon but given her brief atrial flutter with PT, let's keep this going today.  Also likely transition back to p.o. Eliquis tomorrow as well.  We would stop IV heparin at that time.  Acute kidney injury -Baseline creatinine 1.6-1.8, currently slightly improved. 3.8>3.3>3.0>2.6  Hypoperfusion.  Elevated troponin -Secondary to demand ischemia in the setting of heart failure  Suspected sleep apnea -Refuses sleep study in the past     For questions or updates, please contact [facility] Please consult [email or web address] for contact info under        Signed, Candee Furbish, MD  08/08/2018, 12:39 PM

## 2018-08-08 NOTE — Progress Notes (Signed)
[place] for heparin Indication: atrial fibrillation  Allergies  Allergen Reactions  . Ace Inhibitors Palpitations    Patient Measurements: Height: 5\' 6"  (167.6 cm) Weight: 214 lb 15.2 oz (97.5 kg) IBW/kg (Calculated) : 59.3 Heparin Dosing Weight: 84 kg  Vital Signs: Temp: 98.4 F (36.9 C) (09/15 0751) Temp Source: Oral (09/15 0751) BP: 90/64 (09/15 1000) Pulse Rate: 59 (09/15 1000)  Labs: Recent Labs    08/05/18 2047 08/05/18 2115 08/05/18 2356 08/06/18 0614 08/06/18 1200 08/06/18 1344 08/06/18 2208  08/07/18 0358 08/07/18 0838 08/07/18 2118 08/07/18 2248 08/08/18 0339 08/08/18 0747  HGB 11.4* 15.0  --   --   --   --   --   --  12.2  --   --   --  11.5*  --   HCT 40.5 44.0  --   --   --   --   --   --  43.5  --   --   --  41.6  --   PLT 194  --   --   --   --   --   --   --  166  --   --   --  138*  --   APTT 34  --   --   --   --  32  --    < >  --  31 127* 128*  --  96*  LABPROT 26.5*  --   --   --   --   --   --   --   --   --   --   --   --   --   INR 2.46  --   --   --   --   --   --   --   --   --   --   --   --   --   HEPARINUNFRC  --   --   --   --   --  >2.20* >2.20*  --   --  >2.20*  --   --   --   --   CREATININE 3.79* 3.70*  --  3.89*  --   --   --   --  3.03*  --   --   --  2.65*  --   CKTOTAL  --   --   --   --   --   --   --   --  17*  --   --   --   --   --   TROPONINI  --   --  0.13* 0.16* 0.16*  --   --   --   --   --   --   --   --   --    < > = values in this interval not displayed.    Estimated Creatinine Clearance: 24.9 mL/min (A) (by C-G formula based on SCr of 2.65 mg/dL (H)).  Assessment: 67 y.o. female with h/o Afib, Eliquis on hold, for heparin  aptt therapeutic   Cbc stable  Goal of Therapy:  APTT 66-102 Heparin level 0.3-0.7 units/ml Monitor platelets by anticoagulation protocol: Yes   Plan:  Continue heparin 1300 units/hr Daily Hep Lvl CBC aPTT  Levester Fresh, PharmD, BCPS,  BCCCP Clinical Pharmacist [phone]  Please check AMION for all [place] numbers  08/08/2018 10:12 AM

## 2018-08-08 NOTE — Progress Notes (Signed)
[place] for heparin Indication: atrial fibrillation  Allergies  Allergen Reactions  . Ace Inhibitors Palpitations    Patient Measurements: Height: 5\' 6"  (167.6 cm) Weight: 218 lb 7.6 oz (99.1 kg) IBW/kg (Calculated) : 59.3 Heparin Dosing Weight: 84 kg  Vital Signs: Temp: 98 F (36.7 C) (09/14 2000) Temp Source: Oral (09/14 2000) BP: 90/52 (09/15 0000) Pulse Rate: 60 (09/15 0000)  Labs: Recent Labs    08/05/18 2047 08/05/18 2115 08/05/18 2356 08/06/18 0614 08/06/18 1200 08/06/18 1344 08/06/18 2208  08/07/18 0358 08/07/18 0838 08/07/18 2118 08/07/18 2248  HGB 11.4* 15.0  --   --   --   --   --   --  12.2  --   --   --   HCT 40.5 44.0  --   --   --   --   --   --  43.5  --   --   --   PLT 194  --   --   --   --   --   --   --  166  --   --   --   APTT 34  --   --   --   --  32  --    < >  --  31 127* 128*  LABPROT 26.5*  --   --   --   --   --   --   --   --   --   --   --   INR 2.46  --   --   --   --   --   --   --   --   --   --   --   HEPARINUNFRC  --   --   --   --   --  >2.20* >2.20*  --   --  >2.20*  --   --   CREATININE 3.79* 3.70*  --  3.89*  --   --   --   --  3.03*  --   --   --   CKTOTAL  --   --   --   --   --   --   --   --  17*  --   --   --   TROPONINI  --   --  0.13* 0.16* 0.16*  --   --   --   --   --   --   --    < > = values in this interval not displayed.    Estimated Creatinine Clearance: 22 mL/min (A) (by C-G formula based on SCr of 3.03 mg/dL (H)).  Assessment: 67 y.o. female with h/o Afib, Eliquis on hold, for heparin  Goal of Therapy:  APTT 66-102 Heparin level 0.3-0.7 units/ml Monitor platelets by anticoagulation protocol: Yes   Plan:  Decrease Heparin 1300 units/hr Follow-up am labs.   Phillis Knack, PharmD, BCPS   08/08/2018 12:32 AM

## 2018-08-08 NOTE — Progress Notes (Signed)
[place] Progress Note Patient Name: Patricia Wagner DOB: 1951-01-12 MRN: [id number]   Date of Service  08/08/2018  HPI/Events of Note  Hypokalemia  eICU Interventions  Potassium replaced     Intervention Category Minor Interventions: Electrolytes abnormality - evaluation and management  Kymere Fullington 08/08/2018, 5:42 AM

## 2018-08-09 ENCOUNTER — Encounter (HOSPITAL_COMMUNITY): Payer: Medicare HMO

## 2018-08-09 LAB — BASIC METABOLIC PANEL
ANION GAP: 8 (ref 5–15)
BUN: 44 mg/dL — ABNORMAL HIGH (ref 8–23)
CALCIUM: 8.3 mg/dL — AB (ref 8.9–10.3)
CO2: 36 mmol/L — ABNORMAL HIGH (ref 22–32)
CREATININE: 2.25 mg/dL — AB (ref 0.44–1.00)
Chloride: 96 mmol/L — ABNORMAL LOW (ref 98–111)
GFR calc Af Amer: 25 mL/min — ABNORMAL LOW (ref >60)
GFR, EST NON AFRICAN AMERICAN: 22 mL/min — AB (ref >60)
GLUCOSE: 98 mg/dL (ref 70–99)
Potassium: 3 mmol/L — ABNORMAL LOW (ref 3.5–5.1)
Sodium: 140 mmol/L (ref 135–145)

## 2018-08-09 LAB — COOXEMETRY PANEL
CARBOXYHEMOGLOBIN: 2.1 % — AB (ref 0.5–1.5)
Methemoglobin: 1.8 % — ABNORMAL HIGH (ref 0.0–1.5)
O2 Saturation: 60.7 %
Total hemoglobin: 11.3 g/dL — ABNORMAL LOW (ref 12.0–16.0)

## 2018-08-09 LAB — CBC
HEMATOCRIT: 40.6 % (ref 36.0–46.0)
Hemoglobin: 11.7 g/dL — ABNORMAL LOW (ref 12.0–15.0)
MCH: 23.3 pg — AB (ref 26.0–34.0)
MCHC: 28.8 g/dL — AB (ref 30.0–36.0)
MCV: 80.7 fL (ref 78.0–100.0)
Platelets: 114 10*3/uL — ABNORMAL LOW (ref 150–400)
RBC: 5.03 MIL/uL (ref 3.87–5.11)
RDW: 23.2 % — ABNORMAL HIGH (ref 11.5–15.5)
WBC: 7.1 10*3/uL (ref 4.0–10.5)

## 2018-08-09 LAB — GLUCOSE, CAPILLARY
GLUCOSE-CAPILLARY: 98 mg/dL (ref 70–99)
GLUCOSE-CAPILLARY: 86 mg/dL (ref 70–99)
GLUCOSE-CAPILLARY: 78 mg/dL (ref 70–99)
Glucose-Capillary: 117 mg/dL — ABNORMAL HIGH (ref 70–99)
Glucose-Capillary: 130 mg/dL — ABNORMAL HIGH (ref 70–99)
Glucose-Capillary: 142 mg/dL — ABNORMAL HIGH (ref 70–99)

## 2018-08-09 LAB — CULTURE, RESPIRATORY W GRAM STAIN

## 2018-08-09 LAB — APTT: APTT: 88 s — AB (ref 24–36)

## 2018-08-09 LAB — CULTURE, RESPIRATORY

## 2018-08-09 LAB — HEPARIN LEVEL (UNFRACTIONATED)
Heparin Unfractionated: 1.8 IU/mL — ABNORMAL HIGH (ref 0.30–0.70)
Heparin Unfractionated: 1.03 IU/mL — ABNORMAL HIGH (ref 0.30–0.70)

## 2018-08-09 MED ORDER — POTASSIUM CHLORIDE CRYS ER 20 MEQ PO TBCR
40.0000 meq | EXTENDED_RELEASE_TABLET | Freq: Two times a day (BID) | ORAL | Status: AC
  Administered 2018-08-09 – 2018-08-10 (×3): 40 meq via ORAL
  Filled 2018-08-09 (×3): qty 2

## 2018-08-09 MED ORDER — SODIUM CHLORIDE 0.9 % IV SOLN
2.0000 g | INTRAVENOUS | Status: AC
  Administered 2018-08-09 – 2018-08-13 (×5): 2 g via INTRAVENOUS
  Filled 2018-08-09 (×6): qty 20

## 2018-08-09 NOTE — Progress Notes (Addendum)
Advanced Heart Failure Rounding Note  PCP-Cardiologist: Glori Bickers, MD   Subjective:    Extubated 08/07/18. Was able to ambulated with PT.   Coox 60.7% this am. Cr improving. CVP 9-10 on lasix 40 mg BID with good response.   Feeling better. Remains somewhat confused and anxious. But knows where she is, answers questions, and follows instructions. Denies lightheadedness or dizziness, but has not been out of bed. No CP.   Bedside echo completed by Dr [name] 08/06/18 which showed EF 40-45%, but RV severely reduced.   Objective:   Weight Range: 96.3 kg Body mass index is 34.27 kg/m.   Vital Signs:   Temp:  [97.7 F (36.5 C)-98.7 F (37.1 C)] 98.4 F (36.9 C) (09/16 0700) Pulse Rate:  [53-110] 72 (09/16 0800) Resp:  [15-23] 16 (09/16 0800) BP: (94-114)/(58-83) 103/64 (09/16 0800) SpO2:  [87 %-100 %] 96 % (09/16 0800) Weight:  [96.3 kg] 96.3 kg (09/16 0500) Last BM Date: 08/06/18  Weight change: Filed Weights   08/07/18 0428 08/08/18 0352 08/09/18 0500  Weight: 99.1 kg 97.5 kg 96.3 kg   Intake/Output:  Intake/Output Summary (Last 24 hours) at 08/09/2018 1011 Last data filed at 08/09/2018 0800 Gross per 24 hour  Intake 735.11 ml  Output 3725 ml  Net -2989.89 ml    Physical Exam    General: NAD  HEENT: Normal Neck: Supple. JVP 9-10 cm. Carotids 2+ bilat; no bruits. No thyromegaly or nodule noted. Cor: PMI nondisplaced. RRR, No M/G/R noted Lungs: CTAB, normal effort. Abdomen: Soft, non-tender, non-distended, no HSM. No bruits or masses. +BS  Extremities: No cyanosis, clubbing, or rash. Trace to 1+ edema. Neuro: Alert & orientedx3, cranial nerves grossly intact. moves all 4 extremities w/o difficulty. Affect pleasant   Telemetry   Afib 80-90s, personally reviewed  EKG    No new tracings.    Labs    CBC Recent Labs    08/08/18 0339 08/09/18 0739  WBC 7.3 7.1  HGB 11.5* 11.7*  HCT 41.6 40.6  MCV 83.4 80.7  PLT 138* 338*   Basic Metabolic  Panel Recent Labs    08/06/18 1626  08/07/18 0358 08/08/18 0339 08/09/18 0739  NA  --    < > 144 145 140  K  --    < > 2.7* 3.1* 3.0*  CL  --    < > 101 101 96*  CO2  --    < > 34* 35* 36*  GLUCOSE  --    < > 128* 113* 98  BUN  --    < > 60* 54* 44*  CREATININE  --    < > 3.03* 2.65* 2.25*  CALCIUM  --    < > 8.8* 8.3* 8.3*  MG 2.6*  --  2.6*  --   --   PHOS 4.3  --  3.5  --   --    < > = values in this interval not displayed.   Liver Function Tests No results for input(s): AST, ALT, ALKPHOS, BILITOT, PROT, ALBUMIN in the last 72 hours. No results for input(s): LIPASE, AMYLASE in the last 72 hours. Cardiac Enzymes Recent Labs    08/06/18 1200 08/07/18 0358  CKTOTAL  --  17*  TROPONINI 0.16*  --     BNP: BNP (last 3 results) Recent Labs    08/05/18 2155  BNP 3,951.4*    ProBNP (last 3 results) No results for input(s): PROBNP in the last 8760 hours.   D-Dimer No results  for input(s): DDIMER in the last 72 hours. Hemoglobin A1C No results for input(s): HGBA1C in the last 72 hours. Fasting Lipid Panel No results for input(s): CHOL, HDL, LDLCALC, TRIG, CHOLHDL, LDLDIRECT in the last 72 hours. Thyroid Function Tests No results for input(s): TSH, T4TOTAL, T3FREE, THYROIDAB in the last 72 hours.  Invalid input(s): FREET3  Other results:   Imaging     No results found.   Medications:     Scheduled Medications: . amiodarone  150 mg Intravenous Once  . furosemide  40 mg Intravenous Q12H  . sodium chloride flush  3 mL Intravenous Q12H     Infusions: . sodium chloride    . amiodarone 30 mg/hr (08/09/18 0800)  . heparin 1,300 Units/hr (08/09/18 0800)     PRN Medications:  sodium chloride, fentaNYL (SUBLIMAZE) injection, sodium chloride flush    Patient Profile   Patricia Wagner is a 67 y.o. female with a past medical history of paroxysmalatrial fibrillation, NICM cardiomyopathy, Biventricular CHF (LVEF 15% and RV severely reduced by TEE  03/25/17), HTN, hx of recurrent GIB, COPD, anemia, chronic foley catheter, and HLD.   Admitted with facial droop/slurred speech and hypoxia. Found to be in A/C hypoxic respiratory failure and biventricular HF.   Assessment/Plan   1. A/C hypoxic respiratory failure in COPD - Intubated 9/13, extubated 08/07/18.  - Weight down 24 lbs (Matches with her total cumulative I/Os of - 11 L)  2. Facial droop/slurred speech - Head CT negative - MRI showed no acute changes.  - Urine culture with rare bacteria + casts.  - No change.   3. Acute on chronic biventricular CHF due to NICM (suspect AF cardiomyopathy)  - TEE 03/25/17 with LVEF 15% and severely reduced RV function.  - Echo 06/2017 EF 30-35% (up from 15% in setting of AF in 5/18).  - Echo 02/2018: EF 40-45%, grade 1 DD, RV normal - Amyloid w/u negative with negative fat pad biopsy and PYP scan  - Volume status improved. CVP 9-10. Continue IV diuresis today. Cr continues to improve.   - Bedside echo completed by Dr [name], which showed EF 40-45%, but RV severely reduced. Low suspicion for PE since she is on chronic AC.  - Imdur, hydral, and coreg on hold.  -Off spiro and Entresto with CKD  4. Persistent Afib: Cannot tolerate AF with recurrent shock. S/p DCCV on 5/2 (she stopped Amio after this) and DCCV on 05/06/17 and again 05/12/17. - She absolutely cannot tolerate AF with recurrent cardiogenic shock when AF recurs. - Now back in NSR. - Continue amio gt.  - Has been off POs. Ranexa on hold with AKI.  - On heparin for now.  - EP follows outpatient. Holding off on ablation for now because she needs lumpectomy.  5. Suspected Sleep Apnea: - Refuses sleep study. No change.   6. AKI on CKD -  Baseline 1.6-1.8 - Cr trending down. 3.89 -> 3.03 -> 2.65 -> 2.25.   7. HTN - SBP 90-100s   8. Anemia - 11.7 this am. No overt bleeding.   Medication concerns reviewed with patient and pharmacy team. Barriers identified: None at this  time.   Length of Stay: 4  Annamaria Helling  08/09/2018, 10:11 AM  Advanced Heart Failure Team Pager [phone] (M-F; 7a - 4p)  Please contact [facility] for night-coverage after hours (4p -7a ) and weekends on [email or web address]  Patient seen with PA, agree with the above note.  She is diuresing well, I/Os  negative.  CVP 9-10 cm.  She is in NSR.  Creatinine trending down, 2.25 today.    On exam, regular S1S2, occasional rhonchi. JVP 8-9 cm.    I would continue IV Lasix one more day, then probably back to po.  COPD management per internal medicine.   Continue amiodarone gtt, will need to convert to po possibly tomorrow.  Will resume DOAC tomorrow if taking pills without problems.   Loralie Champagne 08/09/2018

## 2018-08-09 NOTE — Progress Notes (Signed)
[place] for heparin Indication: atrial fibrillation  Allergies  Allergen Reactions  . Ace Inhibitors Palpitations    Patient Measurements: Height: 5\' 6"  (167.6 cm) Weight: 216 lb 0.8 oz (98 kg) IBW/kg (Calculated) : 59.3 Heparin Dosing Weight: 84 kg  Vital Signs: Temp: 98.5 F (36.9 C) (09/16 1100) Temp Source: Oral (09/16 1100) BP: 103/64 (09/16 0800) Pulse Rate: 80 (09/16 1000)  Labs: Recent Labs    08/07/18 0358 08/07/18 0838  08/07/18 2248 08/08/18 0339 08/08/18 0747 08/09/18 0739  HGB 12.2  --   --   --  11.5*  --  11.7*  HCT 43.5  --   --   --  41.6  --  40.6  PLT 166  --   --   --  138*  --  114*  APTT  --  31   < > 128*  --  96* 88*  HEPARINUNFRC  --  >2.20*  --   --   --  1.80* 1.03*  CREATININE 3.03*  --   --   --  2.65*  --  2.25*  CKTOTAL 17*  --   --   --   --   --   --    < > = values in this interval not displayed.    Estimated Creatinine Clearance: 29.4 mL/min (A) (by C-G formula based on SCr of 2.25 mg/dL (H)).  Assessment: 67 y.o. female with h/o Afib on Eliquis PTA (on hold). Continuing IV heparin per HF team  aPTT therapeutic at 88 sec Heparin levels are not yet correlating, likely due to renal impairment  CBC stable  Goal of Therapy:  APTT 66-102 Heparin level 0.3-0.7 units/ml Monitor platelets by anticoagulation protocol: Yes   Plan:  Continue heparin 1300 units/hr Daily heparin level, aPTT, CBC Monitor for signs/symptoms of bleeding  Vertis Kelch, PharmD PGY1 Pharmacy Resident Phone [phone] 08/09/2018       12:46 PM

## 2018-08-09 NOTE — Progress Notes (Signed)
[facility] TEAM 1 - Stepdown/ICU TEAM  Patricia Wagner  GDJ:[id number] DOB: 11-Mar-1951 DOA: 08/05/2018 PCP: Biagio Borg, MD    Brief Narrative:  67 y/o female with a history of CVA, HTN, HLD, Diverticulosis w/ GIB, CKD stage IV, PAF on eliquis, severe RV dysfxn, and systolic heart failure (EF 15%) who developed acute respiratory failure with hypoxemia due to a systolic heart failure exacerbation.   Significant Events: 9/12 admit  9/13 intubated 9/15 extubated   Subjective: Alert and conversant. Does not remember much about admission. Denies current cp, sob, n/v, or abdom pain.   Assessment & Plan:  Acute hypoxic respiratory failure - pulmonary edema Care as per CHF Team - much improved at this time and off ventilator   Acute decompensated biventricular systolic heart failure Care as per CHF Team - continues IV diuretic at 2x usual home dose - follow Is/Os and daily weights - prior baseline wgt appears to have been ~102kg  Filed Weights   08/07/18 0428 08/08/18 0352 08/09/18 0500  Weight: 99.1 kg 97.5 kg 96.3 kg    Persistent Atrial fibrillation Remains on amio and IV heparin - care per CHF Team - CHA2DS2-VASc score is 5 - appears to be in NSR at time of my exam   Acute kidney injury on CKD Stage IV Baseline crt appears to have increased in last month to ~2.0-2.4 - follow trend   Recent Labs  Lab 08/05/18 2115 08/06/18 0614 08/07/18 0358 08/08/18 0339 08/09/18 0739  CREATININE 3.70* 3.89* 3.03* 2.65* 2.25*    Mild transaminitis likely due to passive congestion in setting of RV failure - follow   Hypokalemia Supplement to goal of 4.0 - check Mg   Suspected sleep apnea Refused sleep study in the past  L breast papilloma To have lumpectomy per Dr. Brantley Stage  DVT prophylaxis: IV heparin  Code Status: DNR - NO CODE Family Communication: no family present at time of exam  Disposition Plan: SDU   Consultants:  CHF Team  PCCM  Antimicrobials:  Unasyn 9/13    Objective: Blood pressure 103/64, pulse 72, temperature 98.4 F (36.9 C), temperature source Oral, resp. rate 16, height 5\' 6"  (1.676 m), weight 96.3 kg, SpO2 96 %.  Intake/Output Summary (Last 24 hours) at 08/09/2018 0935 Last data filed at 08/09/2018 0800 Gross per 24 hour  Intake 774.78 ml  Output 3725 ml  Net -2950.22 ml   Filed Weights   08/07/18 0428 08/08/18 0352 08/09/18 0500  Weight: 99.1 kg 97.5 kg 96.3 kg    Examination: General: No acute respiratory distress Lungs: bibasilar crackles - no wheezing  Cardiovascular: Regular rate and rhythm without murmur  Abdomen: Nontender, nondistended, soft, bowel sounds positive, no rebound, no ascites, no appreciable mass Extremities: 1+ B LE edema   CBC: Recent Labs  Lab 08/05/18 2047  08/07/18 0358 08/08/18 0339 08/09/18 0739  WBC 5.6  --  6.8 7.3 7.1  NEUTROABS 4.3  --   --   --   --   HGB 11.4*   < > 12.2 11.5* 11.7*  HCT 40.5   < > 43.5 41.6 40.6  MCV 83.0  --  82.4 83.4 80.7  PLT 194  --  166 138* 114*   < > = values in this interval not displayed.   Basic Metabolic Panel: Recent Labs  Lab 08/06/18 1200 08/06/18 1626 08/07/18 0358 08/08/18 0339 08/09/18 0739  NA  --   --  144 145 140  K  --   --  2.7* 3.1* 3.0*  CL  --   --  101 101 96*  CO2  --   --  34* 35* 36*  GLUCOSE  --   --  128* 113* 98  BUN  --   --  60* 54* 44*  CREATININE  --   --  3.03* 2.65* 2.25*  CALCIUM  --   --  8.8* 8.3* 8.3*  MG 2.6* 2.6* 2.6*  --   --   PHOS 4.6 4.3 3.5  --   --    GFR: Estimated Creatinine Clearance: 29.2 mL/min (A) (by C-G formula based on SCr of 2.25 mg/dL (H)).  Liver Function Tests: Recent Labs  Lab 08/05/18 2047 08/06/18 0614  AST 58* 51*  ALT 92* 84*  ALKPHOS 174* 172*  BILITOT 4.9* 3.7*  PROT 6.6 6.4*  ALBUMIN 3.5 3.4*   Recent Labs  Lab 08/05/18 2356  LIPASE 23    Coagulation Profile: Recent Labs  Lab 08/05/18 2047  INR 2.46    Cardiac Enzymes: Recent Labs  Lab 08/05/18 2356  08/06/18 0614 08/06/18 1200 08/07/18 0358  CKTOTAL  --   --   --  17*  TROPONINI 0.13* 0.16* 0.16*  --     HbA1C: Hgb A1c MFr Bld  Date/Time Value Ref Range Status  08/06/2018 06:14 AM 6.4 (H) 4.8 - 5.6 % Final    Comment:    (NOTE) Pre diabetes:          5.7%-6.4% Diabetes:              >6.4% Glycemic control for   <7.0% adults with diabetes   11/21/2016 05:41 AM 6.3 (H) 4.8 - 5.6 % Final    Comment:    (NOTE)         Pre-diabetes: 5.7 - 6.4         Diabetes: >6.4         Glycemic control for adults with diabetes: <7.0     CBG: Recent Labs  Lab 08/08/18 1521 08/08/18 1916 08/08/18 2359 08/09/18 0343 08/09/18 0802  GLUCAP 133* 130* 142* 98 86    Recent Results (from the past 240 hour(s))  MRSA PCR Screening     Status: None   Collection Time: 08/06/18  5:26 AM  Result Value Ref Range Status   MRSA by PCR NEGATIVE NEGATIVE Final    Comment:        The GeneXpert MRSA Assay (FDA approved for NASAL specimens only), is one component of a comprehensive MRSA colonization surveillance program. It is not intended to diagnose MRSA infection nor to guide or monitor treatment for MRSA infections. Performed at [facility], [place] [address] [place], [place] [address]   Culture, respiratory (non-expectorated)     Status: None (Preliminary result)   Collection Time: 08/06/18 12:23 PM  Result Value Ref Range Status   Specimen Description TRACHEAL ASPIRATE  Final   Special Requests NONE  Final   Gram Stain   Final    RARE SQUAMOUS EPITHELIAL CELLS PRESENT FEW WBC PRESENT, PREDOMINANTLY PMN FEW GRAM POSITIVE RODS RARE GRAM POSITIVE COCCI    Culture   Final    MODERATE STREPTOCOCCUS PNEUMONIAE SUSCEPTIBILITIES TO FOLLOW Performed at [facility], [place] [address], [place], [place] [address]    Report Status PENDING  Incomplete     Scheduled Meds: . amiodarone  150 mg Intravenous Once  . furosemide  40 mg Intravenous Q12H  . sodium chloride flush  3  mL  Intravenous Q12H   Continuous Infusions: . sodium chloride    . amiodarone 30 mg/hr (08/09/18 0800)  . heparin 1,300 Units/hr (08/09/18 0800)     LOS: 4 days   Cherene Altes, MD [facility] Office  [phone] Pager - Text Page per Amion  If 7PM-7AM, please contact night-coverage per Amion 08/09/2018, 9:35 AM

## 2018-08-10 ENCOUNTER — Encounter (HOSPITAL_COMMUNITY): Payer: Medicare HMO

## 2018-08-10 LAB — BLOOD GAS, ARTERIAL

## 2018-08-10 LAB — CBC
HCT: 42.9 % (ref 36.0–46.0)
Hemoglobin: 11.9 g/dL — ABNORMAL LOW (ref 12.0–15.0)
MCH: 22.9 pg — ABNORMAL LOW (ref 26.0–34.0)
MCHC: 27.7 g/dL — ABNORMAL LOW (ref 30.0–36.0)
MCV: 82.5 fL (ref 78.0–100.0)
PLATELETS: 115 10*3/uL — AB (ref 150–400)
RBC: 5.2 MIL/uL — AB (ref 3.87–5.11)
RDW: 23.4 % — ABNORMAL HIGH (ref 11.5–15.5)
WBC: 8.5 10*3/uL (ref 4.0–10.5)

## 2018-08-10 LAB — BASIC METABOLIC PANEL
Anion gap: 9 (ref 5–15)
BUN: 39 mg/dL — AB (ref 8–23)
CO2: 38 mmol/L — AB (ref 22–32)
Calcium: 8.5 mg/dL — ABNORMAL LOW (ref 8.9–10.3)
Chloride: 96 mmol/L — ABNORMAL LOW (ref 98–111)
Creatinine, Ser: 2.21 mg/dL — ABNORMAL HIGH (ref 0.44–1.00)
GFR calc non Af Amer: 22 mL/min — ABNORMAL LOW (ref >60)
GFR, EST AFRICAN AMERICAN: 26 mL/min — AB (ref >60)
GLUCOSE: 112 mg/dL — AB (ref 70–99)
Potassium: 3.8 mmol/L (ref 3.5–5.1)
SODIUM: 143 mmol/L (ref 135–145)

## 2018-08-10 LAB — HEPARIN LEVEL (UNFRACTIONATED): Heparin Unfractionated: 0.63 IU/mL (ref 0.30–0.70)

## 2018-08-10 LAB — COOXEMETRY PANEL
CARBOXYHEMOGLOBIN: 2 % — AB (ref 0.5–1.5)
Methemoglobin: 1.8 % — ABNORMAL HIGH (ref 0.0–1.5)
O2 SAT: 65 %
Total hemoglobin: 12 g/dL (ref 12.0–16.0)

## 2018-08-10 LAB — TROPONIN I: Troponin I: 0.06 ng/mL (ref <0.03)

## 2018-08-10 LAB — APTT: APTT: 66 s — AB (ref 24–36)

## 2018-08-10 MED ORDER — FUROSEMIDE 10 MG/ML IJ SOLN
60.0000 mg | Freq: Two times a day (BID) | INTRAMUSCULAR | Status: AC
  Administered 2018-08-10 – 2018-08-13 (×7): 60 mg via INTRAVENOUS
  Filled 2018-08-10 (×8): qty 6

## 2018-08-10 NOTE — Progress Notes (Signed)
Nutrition Follow-up  DOCUMENTATION CODES:   Obesity unspecified  INTERVENTION:    Continue Heart Healthy diet; monitor adequacy of PO's and supplement as needed  NUTRITION DIAGNOSIS:   Inadequate oral intake related to inability to eat as evidenced by NPO status.  Resolved   GOAL:   Patient will meet greater than or equal to 90% of their needs  Met with current PO intake  MONITOR:   PO intake  REASON FOR ASSESSMENT:   Ventilator, Consult Enteral/tube feeding initiation and management  ASSESSMENT:   67 yo female with PMH of NICM (EF 15%), HTN, HLD, morbid obesity, CHF, CAD, CKD, and CVA who was admitted on 9/12 with biventricular HF, hypoxic respiratory failure. Developed A fib, transferred to MICU and intubated 9/13.  Extubated 9/14.  Diet advanced to heart healthy after extubation. Patient reports good intake. RN reports patient ate almost all of her breakfast today.  Labs and medications reviewed.  Weight trending down with diuresis.  I/O -15 L since admission.  Diet Order:   Diet Order            Diet Heart Room service appropriate? Yes; Fluid consistency: Thin  Diet effective now              EDUCATION NEEDS:   No education needs have been identified at this time  Skin:  Skin Assessment: Reviewed RN Assessment  Last BM:  9/17 (type 6)  Height:   Ht Readings from Last 1 Encounters:  08/06/18 5' 6"  (1.676 m)    Weight:   Wt Readings from Last 1 Encounters:  08/10/18 97.6 kg    Ideal Body Weight:  59.1 kg  BMI:  Body mass index is 34.73 kg/m.  Estimated Nutritional Needs:   Kcal:  1650-1850  Protein:  95-115 gm  Fluid:  >/= 1.8 L    Molli Barrows, RD, LDN, [place] Pager [phone] After Hours Pager [phone]

## 2018-08-10 NOTE — Progress Notes (Addendum)
Advanced Heart Failure Rounding Note  PCP-Cardiologist: Glori Bickers, MD   Subjective:    Extubated 08/07/18. Was able to ambulated with PT.   Coox 65% this am. Cr 2.25 -> 2.21. CVP 10-11. Weight down 1 more lb. 21 total.   Feeling somewhat better today. Continues to have clear sputum. Less confused. Mild orthopnea.   Bedside echo completed by Dr [name] 08/06/18 which showed EF 40-45%, but RV severely reduced.   Objective:   Weight Range: 97.6 kg Body mass index is 34.73 kg/m.   Vital Signs:   Temp:  [97.9 F (36.6 C)-99.5 F (37.5 C)] 98.2 F (36.8 C) (09/17 0802) Pulse Rate:  [53-80] 54 (09/17 0415) Resp:  [14-24] 24 (09/17 0415) BP: (99-115)/(60-76) 115/70 (09/17 0415) SpO2:  [94 %-99 %] 96 % (09/17 0415) Weight:  [97.6 kg-98 kg] 97.6 kg (09/17 0313) Last BM Date: 08/06/18  Weight change: Filed Weights   08/09/18 0500 08/09/18 1000 08/10/18 0313  Weight: 96.3 kg 98 kg 97.6 kg   Intake/Output:  Intake/Output Summary (Last 24 hours) at 08/10/2018 0808 Last data filed at 08/10/2018 0600 Gross per 24 hour  Intake 746.74 ml  Output 4150 ml  Net -3403.26 ml    Physical Exam    General: NAD HEENT: Normal anicteric  Neck: Supple. JVP 10 cm+. Carotids 2+ bilat; no bruits. No thyromegaly or nodule noted. Cor: PMI nondisplaced. Regular, slightly brady. +RV lift 2/6 TR Lungs: CTAB, normal effort. No wheeze Abdomen: Soft, non-tender, non-distended, no HSM. No bruits or masses. +BS  Extremities: No cyanosis, clubbing, or rash. Trace to 1+ edema.  Neuro: alert & oriented x 3, cranial nerves grossly intact. moves all 4 extremities w/o difficulty. Affect pleasant  Telemetry   Sinus brady -> converted from Afib ~ 1300 08/09/18, personally reviewed.   EKG    No new tracings.    Labs    CBC Recent Labs    08/09/18 0739 08/10/18 0337  WBC 7.1 8.5  HGB 11.7* 11.9*  HCT 40.6 42.9  MCV 80.7 82.5  PLT 114* 144*   Basic Metabolic Panel Recent Labs   08/09/18 0739 08/10/18 0337  NA 140 143  K 3.0* 3.8  CL 96* 96*  CO2 36* 38*  GLUCOSE 98 112*  BUN 44* 39*  CREATININE 2.25* 2.21*  CALCIUM 8.3* 8.5*   Liver Function Tests No results for input(s): AST, ALT, ALKPHOS, BILITOT, PROT, ALBUMIN in the last 72 hours. No results for input(s): LIPASE, AMYLASE in the last 72 hours. Cardiac Enzymes Recent Labs    08/10/18 0016  TROPONINI 0.06*   BNP: BNP (last 3 results) Recent Labs    08/05/18 2155  BNP 3,951.4*    ProBNP (last 3 results) No results for input(s): PROBNP in the last 8760 hours.   D-Dimer No results for input(s): DDIMER in the last 72 hours. Hemoglobin A1C No results for input(s): HGBA1C in the last 72 hours. Fasting Lipid Panel No results for input(s): CHOL, HDL, LDLCALC, TRIG, CHOLHDL, LDLDIRECT in the last 72 hours. Thyroid Function Tests No results for input(s): TSH, T4TOTAL, T3FREE, THYROIDAB in the last 72 hours.  Invalid input(s): FREET3  Other results:  Imaging   No results found.  Medications:    Scheduled Medications: . furosemide  40 mg Intravenous Q12H  . potassium chloride  40 mEq Oral BID  . sodium chloride flush  3 mL Intravenous Q12H    Infusions: . sodium chloride    . amiodarone 30 mg/hr (08/10/18 0600)  .  cefTRIAXone (ROCEPHIN)  IV Stopped (08/09/18 1652)  . heparin 1,300 Units/hr (08/10/18 0600)    PRN Medications: sodium chloride, fentaNYL (SUBLIMAZE) injection, sodium chloride flush  Patient Profile   Patricia Wagner is a 67 y.o. female with a past medical history of paroxysmalatrial fibrillation, NICM cardiomyopathy, Biventricular CHF (LVEF 15% and RV severely reduced by TEE 03/25/17), HTN, hx of recurrent GIB, COPD, anemia, chronic foley catheter, and HLD.   Admitted with facial droop/slurred speech and hypoxia. Found to be in A/C hypoxic respiratory failure and biventricular HF.   Assessment/Plan   1. A/C hypoxic respiratory failure in COPD - Intubated 9/13,  extubated 08/07/18.  - Weight down 21 lbs total with IV diuresis.   2. Facial droop/slurred speech - Head CT negative - MRI showed no acute changes.  - Urine culture with rare bacteria + casts.  - Improved. Remains intermittently Confused.   3. Acute on chronic biventricular CHF due to NICM (suspect AF cardiomyopathy)  - TEE 03/25/17 with LVEF 15% and severely reduced RV function.  - Echo 06/2017 EF 30-35% (up from 15% in setting of AF in 5/18).  - Echo 02/2018: EF 40-45%, grade 1 DD, RV normal - Amyloid w/u negative with negative fat pad biopsy and PYP scan  - Volume status remains elevated.  CVP 10-11 - Increase lasix to 60 mg BID (Had good response to 40 mg BID initially but output has slowed)   - Bedside echo completed by Dr [name], which showed EF 40-45%, but RV severely reduced. Low suspicion for PE since she is on chronic AC.  - Imdur, hydral, and coreg on hold.  -Off spiro and Entresto with CKD  4. Persistent Afib: Cannot tolerate AF with recurrent shock. S/p DCCV on 5/2 (she stopped Amio after this) and DCCV on 05/06/17 and again 05/12/17. - She absolutely cannot tolerate AF with recurrent cardiogenic shock when AF recurs. - She is now back NSR.  - Continue amio gt. Consider transition to po.  - Has been off POs. Ranexa on hold with AKI.  - On heparin for now. If tolerating pos, should switch back to her Eliquis.  - EP follows outpatient. Holding off on ablation for now because she needs lumpectomy.  5. Suspected Sleep Apnea: - Refuses sleep study. No change.   6. AKI on CKD -  Baseline 1.6-1.8 - Cr trending down. 3.89 -> 3.03 -> 2.65 -> 2.25 -> 2.21  7. HTN - Stable.   8. Anemia - Hgb stable at 11.9.   Medication concerns reviewed with patient and pharmacy team. Barriers identified: None at this time.   Length of Stay: [place], [place]  08/10/2018, 8:08 AM  Advanced Heart Failure Team Pager [phone] (M-F; 7a - 4p)  Please contact [facility] for night-coverage after hours (4p -7a ) and weekends on [email or web address]  Patient seen and examined with the above-signed Advanced Practice Provider and/or Housestaff. I personally reviewed laboratory data, imaging studies and relevant notes. I independently examined the patient and formulated the important aspects of the plan. I have edited the note to reflect any of my changes or salient points. I have personally discussed the plan with the patient and/or family.  Improving slowly. Volume status much better but CVP remains elevated in setting of RV failure. Co-ox ok. I suspect this may be as good as we can get her. Likely can switch back to po diuretics in am.   Now back in NSR. Will continue IV amio one more  day then switch to po.   I remains unclear as to what triggered her decompensation but now seems back on the right rack. Can likely move out of ICU.  We will continue to follow.   Glori Bickers, MD  7:33 PM

## 2018-08-10 NOTE — Progress Notes (Signed)
Physical Therapy Treatment Patient Details Name: Patricia Wagner MRN: [id number] DOB: Mar 31, 1951 Today's Date: 08/10/2018    History of Present Illness 67 y.o. female with acute respiratory failure secondary to acute on chronic biventricular heart failure. VDRF, extubated 9/14;  has a pertinent past medical history including but not limited to Acute hypercapnic respiratory failure ([place]) (04/30/2017), Acute renal failure superimposed on chronic kidney disease ([place]) (10/20/2016), Arthritis, Atrial fibrillation ([place]), Bradycardia (11/20/2016), , Cerebrovascular accident (CVA) due to embolism of left cerebellar artery ([place]),  Chronic anticoagulation (03/15/2018), Chronic atrial fibrillation ([place]) (3/76/2831), Chronic systolic (congestive) heart failure ([place]) (03/05/2018), CKD (chronic kidney disease) stage 3, Hypersomnia, Hypertension    PT Comments    Pt eager to work with therapy as pt was soiled on entry and willing to stand for pericare. Pt requires min A for sitting EoB, and for sit<>stand to RW. Pt incontinent of stool and stand pivot transfer to [facility]. Pt with 3x sit<>stand from [facility] with min guard for pericare. Pt min guard for ambulation to recliner. D/c plans remain appropriate at this time.    Follow Up Recommendations  Home health PT     Equipment Recommendations  Other (comment)(Consider a Rollator RW)       Precautions / Restrictions Precautions Precautions: Fall Restrictions Weight Bearing Restrictions: No    Mobility  Bed Mobility Overal bed mobility: Needs Assistance Bed Mobility: Supine to Sit     Supine to sit: Min assist     General bed mobility comments: min A for pt to pull hips to EoB  Transfers Overall transfer level: Needs assistance Equipment used: 4-wheeled walker Transfers: Sit to/from [facility] Sit to Stand: Min assist;Min guard Stand pivot transfers: Min guard       General transfer comment: MinA for powerup with sit>stand and min guard  for safety to stand pivot transfer to [facility] with min guard, pt with 3x sit<>stand from [facility] secondary to loose stool   Ambulation/Gait Ambulation/Gait assistance: Min guard;+2 safety/equipment Gait Distance (Feet): 5 Feet Assistive device: 4-wheeled walker Gait Pattern/deviations: Step-to pattern;Decreased step length - right;Decreased step length - left;Decreased stride length Gait velocity: slowed Gait velocity interpretation: <1.8 ft/sec, indicate of risk for recurrent falls General Gait Details: min guard for safety for ambulation to recliner         Balance Overall balance assessment: Needs assistance Sitting-balance support: Feet supported;No upper extremity supported Sitting balance-Leahy Scale: Good     Standing balance support: During functional activity;No upper extremity supported Standing balance-Leahy Scale: Good                              Cognition Arousal/Alertness: Awake/alert Behavior During Therapy: WFL for tasks assessed/performed Overall Cognitive Status: Within Functional Limits for tasks assessed                                           General Comments General comments (skin integrity, edema, etc.): VSS, max HR with mobility 115 bpm       Pertinent Vitals/Pain Pain Assessment: No/denies pain           PT Goals (current goals can now be found in the care plan section) Acute Rehab PT Goals Patient Stated Goal: hopes to get home soon PT Goal Formulation: With patient Time For Goal Achievement: 08/22/18 Potential to Achieve Goals: Fair Progress towards  PT goals: Progressing toward goals    Frequency    Min 3X/week      PT Plan Current plan remains appropriate       AM-PAC PT "6 Clicks" Daily Activity  Outcome Measure  Difficulty turning over in bed (including adjusting bedclothes, sheets and blankets)?: A Little Difficulty moving from lying on back to sitting on the side of the bed? : A Lot Difficulty  sitting down on and standing up from a chair with arms (e.g., wheelchair, bedside commode, etc,.)?: A Little Help needed moving to and from a bed to chair (including a wheelchair)?: A Little Help needed walking in hospital room?: A Little Help needed climbing 3-5 steps with a railing? : A Lot 6 Click Score: 16    End of Session Equipment Utilized During Treatment: Gait belt Activity Tolerance: Patient tolerated treatment well Patient left: in chair;with call bell/phone within reach;with chair alarm set Nurse Communication: Mobility status;Other (comment)(needs pulse oximetry monitor) PT Visit Diagnosis: Unsteadiness on feet (R26.81);Other abnormalities of gait and mobility (R26.89);Muscle weakness (generalized) (M62.81)     Time: 3009-2330 PT Time Calculation (min) (ACUTE ONLY): 64 min  Charges:  $Gait Training: 8-22 mins $Therapeutic Activity: 38-52 mins                     Evea Sheek B. Migdalia Dk PT, DPT Acute Rehabilitation Services Pager [phone] Office [phone]    [facility] 08/10/2018, 5:00 PM

## 2018-08-10 NOTE — Progress Notes (Signed)
CRITICAL VALUE ALERT  Critical Value:  Troponin 0.06  Date & Time Notied:  08/10/18 0108  Provider Notified: TRH Tylene Fantasia  Orders Received/Actions taken: awaiting.

## 2018-08-10 NOTE — Progress Notes (Signed)
[place] TEAM 1 - Stepdown/ICU TEAM  Patricia Wagner  JEH:[id number] DOB: June 04, 1951 DOA: 08/05/2018 PCP: Biagio Borg, MD    Brief Narrative:  67 y/o female with a history of CVA, HTN, HLD, Diverticulosis w/ GIB, CKD stage IV, PAF on eliquis, severe RV dysfxn, and systolic heart failure (EF 15%) who developed acute respiratory failure with hypoxemia due to a heart failure exacerbation.   Significant Events: 9/12 admit  9/13 intubated 9/15 extubated  9/16 TRH assumed care   Subjective: The pt is alert and conversant.  She denies current n/v, abdom pain, cp, or sob. Overall she seems to be improving.     Assessment & Plan:  Acute hypoxic respiratory failure - pulmonary edema Care as per CHF Team - much improved at this time and off ventilator   Facial droop - slurred speech Exact etiology not clear - resolved - CT head and MRI w/o acute findings - ?recurdescence of prior stroke deficits in setting of decreased CO due to acute CHF exac  Acute decompensated biventricular systolic heart failure Care as per CHF Team - EF 40-45% per bedside echo per CHF Team - diuretic being increased today - follow Is/Os and daily weights - prior baseline wgt appears to have been ~102kg  Filed Weights   08/09/18 0500 08/09/18 1000 08/10/18 0313  Weight: 96.3 kg 98 kg 97.6 kg    Persistent Atrial fibrillation Remains on amio and IV heparin - RVR has historically rapidly led to cardiogenic shock/severe acute CHF exac - has failed 2 prior attempts at [place] (May and June 2018) - care per CHF Team - CHA2DS2-VASc score is 5 - in NSR presently - on IV amio + heparin   Acute kidney injury on CKD Stage IV Baseline crt appears to have increased in last month to ~2.0-2.4 - crt appears to be slowly improving - cont to follow - avoid potential nephrotoxins   Recent Labs  Lab 08/06/18 0614 08/07/18 0358 08/08/18 0339 08/09/18 0739 08/10/18 0337  CREATININE 3.89* 3.03* 2.65* 2.25* 2.21*    Strep pneumo  tracheobronchitis  Trach asp cx + - IV abx added for now - plan for short course of 5 days as clinically pt is improving   Mild transaminitis likely due to passive congestion in setting of RV failure - follow intermittently   Hypokalemia Supplement to goal of 4.0 - check Mg in AM    Suspected sleep apnea Refused sleep study in the past  L breast papilloma To have lumpectomy per Dr. Brantley Stage when stable medically   DVT prophylaxis: IV heparin  Code Status: DNR - NO CODE Family Communication: no family present at time of exam  Disposition Plan: SDU   Consultants:  CHF Team  PCCM  Antimicrobials:  Unasyn 9/13  Rocephin 9/16 >  Objective: Blood pressure 115/70, pulse (!) 54, temperature 98.2 F (36.8 C), temperature source Oral, resp. rate (!) 24, height 5\' 6"  (1.676 m), weight 97.6 kg, SpO2 96 %.  Intake/Output Summary (Last 24 hours) at 08/10/2018 0804 Last data filed at 08/10/2018 0600 Gross per 24 hour  Intake 746.74 ml  Output 4150 ml  Net -3403.26 ml   Filed Weights   08/09/18 0500 08/09/18 1000 08/10/18 0313  Weight: 96.3 kg 98 kg 97.6 kg    Examination: General: No acute respiratory distress at rest in bed  Lungs: bibasilar crackles w/o wheezing  Cardiovascular: Regular rate and rhythm without murmur or rub  Abdomen: NT/ND, soft, bs+, no mass  Extremities: 2+  B LE edema   CBC: Recent Labs  Lab 08/05/18 2047  08/08/18 0339 08/09/18 0739 08/10/18 0337  WBC 5.6   < > 7.3 7.1 8.5  NEUTROABS 4.3  --   --   --   --   HGB 11.4*   < > 11.5* 11.7* 11.9*  HCT 40.5   < > 41.6 40.6 42.9  MCV 83.0   < > 83.4 80.7 82.5  PLT 194   < > 138* 114* 115*   < > = values in this interval not displayed.   Basic Metabolic Panel: Recent Labs  Lab 08/06/18 1200 08/06/18 1626 08/07/18 0358 08/08/18 0339 08/09/18 0739 08/10/18 0337  NA  --   --  144 145 140 143  K  --   --  2.7* 3.1* 3.0* 3.8  CL  --   --  101 101 96* 96*  CO2  --   --  34* 35* 36* 38*  GLUCOSE  --    --  128* 113* 98 112*  BUN  --   --  60* 54* 44* 39*  CREATININE  --   --  3.03* 2.65* 2.25* 2.21*  CALCIUM  --   --  8.8* 8.3* 8.3* 8.5*  MG 2.6* 2.6* 2.6*  --   --   --   PHOS 4.6 4.3 3.5  --   --   --    GFR: Estimated Creatinine Clearance: 29.9 mL/min (A) (by C-G formula based on SCr of 2.21 mg/dL (H)).  Liver Function Tests: Recent Labs  Lab 08/05/18 2047 08/06/18 0614  AST 58* 51*  ALT 92* 84*  ALKPHOS 174* 172*  BILITOT 4.9* 3.7*  PROT 6.6 6.4*  ALBUMIN 3.5 3.4*   Recent Labs  Lab 08/05/18 2356  LIPASE 23    Coagulation Profile: Recent Labs  Lab 08/05/18 2047  INR 2.46    Cardiac Enzymes: Recent Labs  Lab 08/05/18 2356 08/06/18 0614 08/06/18 1200 08/07/18 0358 08/10/18 0016  CKTOTAL  --   --   --  17*  --   TROPONINI 0.13* 0.16* 0.16*  --  0.06*    HbA1C: Hgb A1c MFr Bld  Date/Time Value Ref Range Status  08/06/2018 06:14 AM 6.4 (H) 4.8 - 5.6 % Final    Comment:    (NOTE) Pre diabetes:          5.7%-6.4% Diabetes:              >6.4% Glycemic control for   <7.0% adults with diabetes   11/21/2016 05:41 AM 6.3 (H) 4.8 - 5.6 % Final    Comment:    (NOTE)         Pre-diabetes: 5.7 - 6.4         Diabetes: >6.4         Glycemic control for adults with diabetes: <7.0     CBG: Recent Labs  Lab 08/08/18 2359 08/09/18 0343 08/09/18 0802 08/09/18 1136 08/09/18 1528  GLUCAP 142* 98 86 78 117*    Recent Results (from the past 240 hour(s))  MRSA PCR Screening     Status: None   Collection Time: 08/06/18  5:26 AM  Result Value Ref Range Status   MRSA by PCR NEGATIVE NEGATIVE Final    Comment:        The GeneXpert MRSA Assay (FDA approved for NASAL specimens only), is one component of a comprehensive MRSA colonization surveillance program. It is not intended to diagnose MRSA infection nor to guide  or monitor treatment for MRSA infections. Performed at [facility], [place] [address], [place], [place] [address]   Culture,  respiratory (non-expectorated)     Status: None   Collection Time: 08/06/18 12:23 PM  Result Value Ref Range Status   Specimen Description TRACHEAL ASPIRATE  Final   Special Requests NONE  Final   Gram Stain   Final    RARE SQUAMOUS EPITHELIAL CELLS PRESENT FEW WBC PRESENT, PREDOMINANTLY PMN FEW GRAM POSITIVE RODS RARE GRAM POSITIVE COCCI Performed at [facility], [place] [address], [place], [place] [address]    Culture MODERATE STREPTOCOCCUS PNEUMONIAE  Final   Report Status 08/09/2018 FINAL  Final   Organism ID, Bacteria STREPTOCOCCUS PNEUMONIAE  Final      Susceptibility   Streptococcus pneumoniae - MIC*    ERYTHROMYCIN 2 RESISTANT Resistant     LEVOFLOXACIN 1 SENSITIVE Sensitive     PENICILLIN (meningitis) 1 RESISTANT Resistant     PENICILLIN (non-meningitis) 1 SENSITIVE Sensitive     CEFTRIAXONE (non-meningitis) 0.5 SENSITIVE Sensitive     CEFTRIAXONE (meningitis) 0.5 SENSITIVE Sensitive     * MODERATE STREPTOCOCCUS PNEUMONIAE     Scheduled Meds: . furosemide  40 mg Intravenous Q12H  . potassium chloride  40 mEq Oral BID  . sodium chloride flush  3 mL Intravenous Q12H   Continuous Infusions: . sodium chloride    . amiodarone 30 mg/hr (08/10/18 0600)  . cefTRIAXone (ROCEPHIN)  IV Stopped (08/09/18 1652)  . heparin 1,300 Units/hr (08/10/18 0600)     LOS: 5 days   Cherene Altes, MD [facility] Office  [phone] Pager - Text Page per Amion  If 7PM-7AM, please contact night-coverage per Amion 08/10/2018, 8:04 AM

## 2018-08-10 NOTE — Progress Notes (Signed)
[place] for heparin Indication: atrial fibrillation  Allergies  Allergen Reactions  . Ace Inhibitors Palpitations    Patient Measurements: Height: 5\' 6"  (167.6 cm) Weight: 215 lb 2.7 oz (97.6 kg) IBW/kg (Calculated) : 59.3 Heparin Dosing Weight: 84 kg  Vital Signs: Temp: 98.2 F (36.8 C) (09/17 0802) Temp Source: Oral (09/17 0802) BP: 115/70 (09/17 0415) Pulse Rate: 54 (09/17 0415)  Labs: Recent Labs    08/08/18 0339 08/08/18 0747 08/09/18 0739 08/10/18 0016 08/10/18 0337  HGB 11.5*  --  11.7*  --  11.9*  HCT 41.6  --  40.6  --  42.9  PLT 138*  --  114*  --  115*  APTT  --  96* 88*  --  66*  HEPARINUNFRC  --  1.80* 1.03*  --  0.63  CREATININE 2.65*  --  2.25*  --  2.21*  TROPONINI  --   --   --  0.06*  --     Estimated Creatinine Clearance: 29.9 mL/min (A) (by C-G formula based on SCr of 2.21 mg/dL (H)).  Assessment: 67 y.o. female with h/o Afib on Eliquis PTA (on hold). Continuing IV heparin per HF team  Heparin level came back therapeutic at 0.63, aPTT also therapeutic at 66- now correlating so will monitor only heparin levels. Hgb 11.9, plt 115. No s/sx of bleeding. No infusion issues.   Goal of Therapy:  APTT 66-102 Heparin level 0.3-0.7 units/ml Monitor platelets by anticoagulation protocol: Yes   Plan:  Continue heparin 1300 units/hr Daily heparin level, CBC Monitor for signs/symptoms of bleeding  Doylene Canard, PharmD Clinical Pharmacist  Pager: [phone] Phone: [phone] 08/10/2018       9:11 AM

## 2018-08-10 NOTE — Pre-Procedure Instructions (Signed)
SHRESTA RISDEN  08/10/2018      [id number] - [place], [place]  [place] [address] Phone: [phone] Fax: [phone]    Your procedure is scheduled on August 18, 2018.  Report to [facility] Admitting at 730 AM.  Call this number if you have problems the morning of surgery:  [phone]   Remember:  Do not eat or drink after midnight.    Take these medicines the morning of surgery with A SIP OF WATER  Amiodarone (pacerone) Carvedilol (coreg) Ranolazine (ranexa)  Follow your surgeon's instructions on when to hold/resume Eliquis.  If no instructions were given call the office to determine how they would like to you take Eliquis   Do not wear jewelry, make-up or nail polish.  Do not wear lotions, powders, or perfumes, or deodorant.  Do not shave 48 hours prior to surgery.    Do not bring valuables to the hospital.   [facility] is not responsible for any belongings or valuables.  Contacts, dentures or bridgework may not be worn into surgery.  Leave your suitcase in the car.  After surgery it may be brought to your room.  For patients admitted to the hospital, discharge time will be determined by your treatment team.  Patients discharged the day of surgery will not be allowed to drive home.    [address]- Preparing For Surgery  Before surgery, you can play an important role. Because skin is not sterile, your skin needs to be as free of germs as possible. You can reduce the number of germs on your skin by washing with CHG (chlorahexidine gluconate) Soap before surgery.  CHG is an antiseptic cleaner which kills germs and bonds with the skin to continue killing germs even after washing.    Oral Hygiene is also important to reduce your risk of infection.  Remember - BRUSH YOUR TEETH THE MORNING OF SURGERY WITH YOUR REGULAR TOOTHPASTE  Please do not use if you have an allergy to CHG or antibacterial soaps. If your skin  becomes reddened/irritated stop using the CHG.  Do not shave (including legs and underarms) for at least 48 hours prior to first CHG shower. It is OK to shave your face.  Please follow these instructions carefully.   1. Shower the NIGHT BEFORE SURGERY and the MORNING OF SURGERY with CHG.   2. If you chose to wash your hair, wash your hair first as usual with your normal shampoo.  3. After you shampoo, rinse your hair and body thoroughly to remove the shampoo.  4. Use CHG as you would any other liquid soap. You can apply CHG directly to the skin and wash gently with a scrungie or a clean washcloth.   5. Apply the CHG Soap to your body ONLY FROM THE NECK DOWN.  Do not use on open wounds or open sores. Avoid contact with your eyes, ears, mouth and genitals (private parts). Wash Face and genitals (private parts)  with your normal soap.  6. Wash thoroughly, paying special attention to the area where your surgery will be performed.  7. Thoroughly rinse your body with warm water from the neck down.  8. DO NOT shower/wash with your normal soap after using and rinsing off the CHG Soap.  9. Pat yourself dry with a CLEAN TOWEL.  10. Wear CLEAN PAJAMAS to bed the night before surgery, wear comfortable clothes the morning of surgery  11. Place CLEAN SHEETS on  your bed the night of your first shower and DO NOT SLEEP WITH PETS.  Day of Surgery:  Do not apply any deodorants/lotions.  Please wear clean clothes to the hospital/surgery center.   Remember to brush your teeth WITH YOUR REGULAR TOOTHPASTE.   Please read over the following fact sheets that you were given.

## 2018-08-11 ENCOUNTER — Inpatient Hospital Stay (HOSPITAL_COMMUNITY)
Admission: RE | Admit: 2018-08-11 | Discharge: 2018-08-11 | Disposition: A | Discharge status: Home / Self Care | Payer: Medicare HMO | Source: Ambulatory Visit

## 2018-08-11 LAB — HEPATIC FUNCTION PANEL
ALT: 24 U/L (ref 0–44)
AST: 9 U/L — ABNORMAL LOW (ref 15–41)
Albumin: 2.6 g/dL — ABNORMAL LOW (ref 3.5–5.0)
Alkaline Phosphatase: 81 U/L (ref 38–126)
BILIRUBIN INDIRECT: 0.8 mg/dL (ref 0.3–0.9)
Bilirubin, Direct: 0.4 mg/dL — ABNORMAL HIGH (ref 0.0–0.2)
Total Bilirubin: 1.2 mg/dL (ref 0.3–1.2)
Total Protein: 6 g/dL — ABNORMAL LOW (ref 6.5–8.1)

## 2018-08-11 LAB — BASIC METABOLIC PANEL
Anion gap: 13 (ref 5–15)
BUN: 37 mg/dL — ABNORMAL HIGH (ref 8–23)
CALCIUM: 8.8 mg/dL — AB (ref 8.9–10.3)
CHLORIDE: 94 mmol/L — AB (ref 98–111)
CO2: 39 mmol/L — ABNORMAL HIGH (ref 22–32)
CREATININE: 2.03 mg/dL — AB (ref 0.44–1.00)
GFR calc non Af Amer: 25 mL/min — ABNORMAL LOW (ref >60)
GFR, EST AFRICAN AMERICAN: 28 mL/min — AB (ref >60)
Glucose, Bld: 87 mg/dL (ref 70–99)
Potassium: 3.7 mmol/L (ref 3.5–5.1)
Sodium: 146 mmol/L — ABNORMAL HIGH (ref 135–145)

## 2018-08-11 LAB — COOXEMETRY PANEL
CARBOXYHEMOGLOBIN: 2.3 % — AB (ref 0.5–1.5)
Methemoglobin: 1.8 % — ABNORMAL HIGH (ref 0.0–1.5)
O2 SAT: 66 %
Total hemoglobin: 11.7 g/dL — ABNORMAL LOW (ref 12.0–16.0)

## 2018-08-11 LAB — CBC
HCT: 40.8 % (ref 36.0–46.0)
Hemoglobin: 11.4 g/dL — ABNORMAL LOW (ref 12.0–15.0)
MCH: 23.2 pg — ABNORMAL LOW (ref 26.0–34.0)
MCHC: 27.9 g/dL — ABNORMAL LOW (ref 30.0–36.0)
MCV: 83.1 fL (ref 78.0–100.0)
PLATELETS: 104 10*3/uL — AB (ref 150–400)
RBC: 4.91 MIL/uL (ref 3.87–5.11)
RDW: 23.6 % — AB (ref 11.5–15.5)
WBC: 7.5 10*3/uL (ref 4.0–10.5)

## 2018-08-11 LAB — HEPARIN LEVEL (UNFRACTIONATED): HEPARIN UNFRACTIONATED: 0.33 [IU]/mL (ref 0.30–0.70)

## 2018-08-11 LAB — MAGNESIUM: MAGNESIUM: 1.9 mg/dL (ref 1.7–2.4)

## 2018-08-11 MED ORDER — AMIODARONE LOAD VIA INFUSION
150.0000 mg | Freq: Once | INTRAVENOUS | Status: AC
  Administered 2018-08-11: 150 mg via INTRAVENOUS
  Filled 2018-08-11: qty 83.34

## 2018-08-11 MED ORDER — APIXABAN 5 MG PO TABS
5.0000 mg | ORAL_TABLET | Freq: Two times a day (BID) | ORAL | Status: DC
  Administered 2018-08-11 – 2018-08-17 (×12): 5 mg via ORAL
  Filled 2018-08-11 (×13): qty 1

## 2018-08-11 MED ORDER — MAGNESIUM OXIDE 400 (241.3 MG) MG PO TABS
400.0000 mg | ORAL_TABLET | Freq: Two times a day (BID) | ORAL | Status: DC
  Administered 2018-08-11 – 2018-08-14 (×7): 400 mg via ORAL
  Filled 2018-08-11 (×8): qty 1

## 2018-08-11 MED ORDER — POTASSIUM CHLORIDE CRYS ER 20 MEQ PO TBCR
40.0000 meq | EXTENDED_RELEASE_TABLET | Freq: Once | ORAL | Status: AC
  Administered 2018-08-11: 40 meq via ORAL
  Filled 2018-08-11: qty 2

## 2018-08-11 MED ORDER — AMIODARONE IV BOLUS ONLY 150 MG/100ML
150.0000 mg | Freq: Once | INTRAVENOUS | Status: AC
  Administered 2018-08-11: 150 mg via INTRAVENOUS

## 2018-08-11 NOTE — Progress Notes (Signed)
[place] TEAM 1 - Stepdown/ICU TEAM  Patricia Wagner  UDJ:[id number] DOB: 07-30-1951 DOA: 08/05/2018 PCP: Biagio Borg, MD    Brief Narrative:   67 y/o female with a history of CVA, HTN, HLD, Diverticulosis w/ GIB, CKD stage IV, PAF on eliquis, severe RV dysfxn, and systolic heart failure (EF 15%) who developed acute respiratory failure with hypoxemia due to a heart failure exacerbation.   Significant Events: 9/12 admit  9/13 intubated 9/15 extubated  9/16 TRH assumed care   Subjective:  Awake alert no distress eating drinking to some degree asymptomatic from chest pain no fever no chills Feels cold  Assessment & Plan:  Acute hypoxic respiratory failure - pulmonary edema Care as per CHF Team -   Facial droop - slurred speech Exact etiology not clear - resolved - CT head and MRI w/o acute findings - ?recurdescence of prior stroke deficits in setting of decreased CO due to acute CHF exac  Acute decompensated biventricular systolic heart failure Care as per CHF Team - EF 40-45% per bedside echo per CHF Team - diuretics are IV as per--15 L so far, weight has dropped as well- prior baseline wgt appears to have been ~102kg currently is 95 -Patient has a contraction alkalosis likely secondary to diuresis in a setting of a sodium 146 and chloride 39-I would defer further management of diuretics as above to cardiology-unclear at this stage without symptoms if she needs Diamox versus not  Persistent Atrial fibrillation Remains on amio and IV heparin - RVR has historically rapidly led to cardiogenic shock/severe acute CHF exac - has failed 2 prior attempts at [place] (May and June 2018) - care per CHF Team - CHA2DS2-VASc score is 5 - in NSR presently - on IV amio + heparin  Called as the patient flipped back into rapid A. fib- magnesium is 1.9 which is not terribly low but we will replete with Mag-Ox 400 twice daily ?  Digoxin-defer to heart failure team  impaiared glucose tolerance-A1c was  6.4-needs outpatient discussion regarding the same-glycemic control as PRN  Acute kidney injury on CKD Stage IV and creatinine on admission 65/3.7 Baseline crt appears to have increased in last month to ~2.0-2.4 - crt today is 2.03  Strep pneumo tracheobronchitis  Trach asp cx + - IV abx added for now - plan for short course of 5 days ending on 9/20 ceftriaxone  Mild transaminitis likely due to passive congestion in setting of RV failure - follow intermittently   Hypokalemia Supplement to goal of 4.0 -see above discussion regarding magnesium  Suspected sleep apnea Refused sleep study in the past  L breast papilloma To have lumpectomy per Dr. Brantley Stage when stable medically   DVT prophylaxis: IV heparin Code Status: DNR - NO CODE Family Communication: no family present at time of exam Disposition Plan: SDU   Consultants:  CHF Team  PCCM  Antimicrobials:  Unasyn 9/13  Rocephin 9/16 >  Objective: Blood pressure 90/60, pulse 98, temperature 98.7 F (37.1 C), temperature source Oral, resp. rate 20, height 5\' 6"  (1.676 m), weight 95 kg, SpO2 95 %.  Intake/Output Summary (Last 24 hours) at 08/11/2018 1116 Last data filed at 08/11/2018 1000 Gross per 24 hour  Intake 1478.49 ml  Output 3130 ml  Net -1651.51 ml   Filed Weights   08/09/18 1000 08/10/18 0313 08/11/18 0500  Weight: 98 kg 97.6 kg 95 kg    Examination:  Awake alert no distress EOMI NCAT no icterus no pallor arcus senilis  noted, right IJ is noted, chest is clinically clear without added sound Abdomen is soft nontender No lower extremity edema Not appreciate JVD Not appreciate bruit Neurologically is intact Psych is euthymic  CBC: Recent Labs  Lab 08/05/18 2047  08/09/18 0739 08/10/18 0337 08/11/18 0501  WBC 5.6   < > 7.1 8.5 7.5  NEUTROABS 4.3  --   --   --   --   HGB 11.4*   < > 11.7* 11.9* 11.4*  HCT 40.5   < > 40.6 42.9 40.8  MCV 83.0   < > 80.7 82.5 83.1  PLT 194   < > 114* 115* 104*   < > =  values in this interval not displayed.   Basic Metabolic Panel: Recent Labs  Lab 08/06/18 1200 08/06/18 1626 08/07/18 0358  08/09/18 0739 08/10/18 0337 08/11/18 0501  NA  --   --  144   < > 140 143 146*  K  --   --  2.7*   < > 3.0* 3.8 3.7  CL  --   --  101   < > 96* 96* 94*  CO2  --   --  34*   < > 36* 38* 39*  GLUCOSE  --   --  128*   < > 98 112* 87  BUN  --   --  60*   < > 44* 39* 37*  CREATININE  --   --  3.03*   < > 2.25* 2.21* 2.03*  CALCIUM  --   --  8.8*   < > 8.3* 8.5* 8.8*  MG 2.6* 2.6* 2.6*  --   --   --  1.9  PHOS 4.6 4.3 3.5  --   --   --   --    < > = values in this interval not displayed.   GFR: Estimated Creatinine Clearance: 32.1 mL/min (A) (by C-G formula based on SCr of 2.03 mg/dL (H)).  Liver Function Tests: Recent Labs  Lab 08/05/18 2047 08/06/18 0614 08/11/18 0501  AST 58* 51* 9*  ALT 92* 84* 24  ALKPHOS 174* 172* 81  BILITOT 4.9* 3.7* 1.2  PROT 6.6 6.4* 6.0*  ALBUMIN 3.5 3.4* 2.6*   Recent Labs  Lab 08/05/18 2356  LIPASE 23    Coagulation Profile: Recent Labs  Lab 08/05/18 2047  INR 2.46    Cardiac Enzymes: Recent Labs  Lab 08/05/18 2356 08/06/18 0614 08/06/18 1200 08/07/18 0358 08/10/18 0016  CKTOTAL  --   --   --  17*  --   TROPONINI 0.13* 0.16* 0.16*  --  0.06*     CBG: Recent Labs  Lab 08/08/18 2359 08/09/18 0343 08/09/18 0802 08/09/18 1136 08/09/18 1528  GLUCAP 142* 98 86 78 117*       Scheduled Meds: . amiodarone  150 mg Intravenous Once  . furosemide  60 mg Intravenous BID  . magnesium oxide  400 mg Oral BID  . sodium chloride flush  3 mL Intravenous Q12H   Continuous Infusions: . sodium chloride    . amiodarone 30 mg/hr (08/11/18 0700)  . cefTRIAXone (ROCEPHIN)  IV 2 g (08/10/18 1711)  . heparin 1,300 Units/hr (08/11/18 0700)     LOS: 6 days    Verneita Griffes, MD [facility] 11:17 AM   If 7PM-7AM, please contact night-coverage per Amion 08/11/2018, 11:16 AM

## 2018-08-11 NOTE — Progress Notes (Addendum)
Called and spoke with Sunday Spillers RN at Dr. Josetta Huddle office (hipaa verified patient name/dob) and advised her that this patient is currently an inpatient and will not be seen in PAT today.  Sylvia,RN verbalized understanding and said she would inform Dr. Brantley Stage. Informed Geoffry Paradise, scheduler for department as well.

## 2018-08-11 NOTE — Progress Notes (Addendum)
Pt went back into afib this AM, confirmed by EKG. Rate controlled, last BP 90/60 (70), CVP this AM is 7. Relayed to Dr. Verlon Au as well as covering Heart Failure Team Provider. No changes for now, both will be by to assess pt later today.

## 2018-08-11 NOTE — Progress Notes (Addendum)
Advanced Heart Failure Rounding Note  PCP-Cardiologist: Glori Bickers, MD   Subjective:    Extubated 08/07/18. Was able to ambulated with PT.   Coox 66% this am. Cr 2.25 -> 2.21 -> 2.03. CVP 15. Weight down 6 more lbs with IV lasix. 27 lbs total. SBP 90s  Converted back into afib/flutter around 23:00 last night, rate 80-90s.  Denies SOB or orthopnea. Got OOB to chair yesterday. She cannot tell that she is in afib.   Bedside echo completed by Dr [name] 08/06/18 which showed EF 40-45%, but RV severely reduced.   Objective:   Weight Range: 95 kg Body mass index is 33.8 kg/m.   Vital Signs:   Temp:  [97.7 F (36.5 C)-98.8 F (37.1 C)] 98.7 F (37.1 C) (09/18 0700) Pulse Rate:  [55-98] 98 (09/18 1016) Resp:  [14-25] 20 (09/18 1016) BP: (87-110)/(50-68) 90/60 (09/18 1016) SpO2:  [85 %-100 %] 95 % (09/18 1016) Weight:  [95 kg] 95 kg (09/18 0500) Last BM Date: 08/10/18  Weight change: Filed Weights   08/09/18 1000 08/10/18 0313 08/11/18 0500  Weight: 98 kg 97.6 kg 95 kg   Intake/Output:  Intake/Output Summary (Last 24 hours) at 08/11/2018 1047 Last data filed at 08/11/2018 1000 Gross per 24 hour  Intake 1518.19 ml  Output 3655 ml  Net -2136.81 ml    Physical Exam    General: No resp difficulty. HEENT: Normal Neck: Supple. JVP 15. Carotids 2+ bilat; no bruits. No thyromegaly or nodule noted. Cor: PMI nondisplaced. IRR, +RV lift, 2/6 TR Lungs: CTAB, normal effort. On 2 L [place] Abdomen: Soft, non-tender, non-distended, no HSM. No bruits or masses. +BS  Extremities: No cyanosis, clubbing, or rash. R and LLE 1+ edema Neuro: Alert & orientedx3, cranial nerves grossly intact. moves all 4 extremities w/o difficulty. Affect pleasant   Telemetry   Sinus brady until 23:00 9/17. Now back in afib/flutter 70-80s. Personally reviewed.   EKG    No new tracings.    Labs    CBC Recent Labs    08/10/18 0337 08/11/18 0501  WBC 8.5 7.5  HGB 11.9* 11.4*  HCT 42.9  40.8  MCV 82.5 83.1  PLT 115* 240*   Basic Metabolic Panel Recent Labs    08/10/18 0337 08/11/18 0501  NA 143 146*  K 3.8 3.7  CL 96* 94*  CO2 38* 39*  GLUCOSE 112* 87  BUN 39* 37*  CREATININE 2.21* 2.03*  CALCIUM 8.5* 8.8*  MG  --  1.9   Liver Function Tests Recent Labs    08/11/18 0501  AST 9*  ALT 24  ALKPHOS 81  BILITOT 1.2  PROT 6.0*  ALBUMIN 2.6*   No results for input(s): LIPASE, AMYLASE in the last 72 hours. Cardiac Enzymes Recent Labs    08/10/18 0016  TROPONINI 0.06*   BNP: BNP (last 3 results) Recent Labs    08/05/18 2155  BNP 3,951.4*    ProBNP (last 3 results) No results for input(s): PROBNP in the last 8760 hours.   D-Dimer No results for input(s): DDIMER in the last 72 hours. Hemoglobin A1C No results for input(s): HGBA1C in the last 72 hours. Fasting Lipid Panel No results for input(s): CHOL, HDL, LDLCALC, TRIG, CHOLHDL, LDLDIRECT in the last 72 hours. Thyroid Function Tests No results for input(s): TSH, T4TOTAL, T3FREE, THYROIDAB in the last 72 hours.  Invalid input(s): FREET3  Other results:  Imaging   No results found.  Medications:    Scheduled Medications: . furosemide  60  mg Intravenous BID  . magnesium oxide  400 mg Oral BID  . sodium chloride flush  3 mL Intravenous Q12H    Infusions: . sodium chloride    . amiodarone 30 mg/hr (08/11/18 1900)  . cefTRIAXone (ROCEPHIN)  IV Stopped (08/11/18 1855)  . heparin 1,300 Units/hr (08/11/18 1900)    PRN Medications: sodium chloride, fentaNYL (SUBLIMAZE) injection, sodium chloride flush  Patient Profile   Patricia Wagner is a 67 y.o. female with a past medical history of paroxysmalatrial fibrillation, NICM cardiomyopathy, Biventricular CHF (LVEF 15% and RV severely reduced by TEE 03/25/17), HTN, hx of recurrent GIB, COPD, anemia, chronic foley catheter, and HLD.   Admitted with facial droop/slurred speech and hypoxia. Found to be in A/C hypoxic respiratory failure and  biventricular HF.   Assessment/Plan   1. A/C hypoxic respiratory failure in COPD - Intubated 9/13, extubated 08/07/18.  - Weight down 27 lbs total with IV diuresis. On 2L [place]  2. Facial droop/slurred speech - Head CT negative - MRI showed no acute changes.  - Urine culture with rare bacteria + casts.  - Seems improved. Alert and oriented today.   3. Acute on chronic biventricular CHF due to NICM (suspect AF cardiomyopathy)  - TEE 03/25/17 with LVEF 15% and severely reduced RV function.  - Echo 06/2017 EF 30-35% (up from 15% in setting of AF in 5/18).  - Echo 02/2018: EF 40-45%, grade 1 DD, RV normal - Amyloid w/u negative with negative fat pad biopsy and PYP scan  - Volume status remains elevated.  CVP 15. Coox 66% - Continue lasix 60 mg BID. Had good response yesterday. Will hold off on switching to PO since she is back in afib - Bedside echo completed by Dr [name], which showed EF 40-45%, but RV severely reduced. Low suspicion for PE since she is on chronic AC.  - Imdur, hydral, and coreg on hold. SBP ~90. No room to add back.  -Off spiro and Entresto with CKD  4. Persistent Afib: Cannot tolerate AF with recurrent shock. S/p DCCV on 5/2 (she stopped Amio after this) and DCCV on 05/06/17 and again 05/12/17. - She absolutely cannot tolerate AF with recurrent cardiogenic shock when AF recurs. - Converted back into AF yesterday evening, rate 80-90s. Continue IV amiodarone for now. May need to rebolus. SBP 90s. - Ranexa on hold with AKI.  - On heparin for now. If tolerating pos, should switch back to her Eliquis. - EP follows outpatient. Holding off on ablation for now because she needs lumpectomy.  5. Suspected Sleep Apnea: - Refuses sleep study. No change.  6. AKI on CKD -  Baseline 1.6-1.8 - Cr trending down. 3.89 -> 3.03 -> 2.65 -> 2.25 -> 2.21 -> 2.03  7. HTN - Stable. SBP 90s.  8. Anemia - Hgb stable at 11.4  Medication concerns reviewed with patient and pharmacy  team. Barriers identified: None at this time.   Length of Stay: [place], NP  08/11/2018, 10:47 AM  Advanced Heart Failure Team Pager [phone] (M-F; 7a - 4p)  Please contact [facility] for night-coverage after hours (4p -7a ) and weekends on [email or web address]    Patient seen and examined with the above-signed Advanced Practice Provider and/or Housestaff. I personally reviewed laboratory data, imaging studies and relevant notes. I independently examined the patient and formulated the important aspects of the plan. I have edited the note to reflect any of my changes or salient points. I have personally discussed the  plan with the patient and/or family.  Back in AF with RVR. Received 1 bolus of amio today and remains on gtt at 30 but AF persists. Co-ox ok. Diuresing well. CVP 9-10 tonight. Renal function improving. Switch to po diuretics in am. Remains on heparin. Will switch back to Eliquis. May need DC-CV.   Glori Bickers, MD  8:11 PM

## 2018-08-11 NOTE — Progress Notes (Signed)
[place] for heparin Indication: atrial fibrillation  Allergies  Allergen Reactions  . Ace Inhibitors Palpitations    Patient Measurements: Height: 5\' 6"  (167.6 cm) Weight: 209 lb 7 oz (95 kg) IBW/kg (Calculated) : 59.3 Heparin Dosing Weight: 84 kg  Vital Signs: Temp: 98.5 F (36.9 C) (09/18 1144) Temp Source: Oral (09/18 1144) BP: 110/73 (09/18 1306) Pulse Rate: 83 (09/18 1306)  Labs: Recent Labs    08/09/18 0739 08/10/18 0016 08/10/18 0337 08/11/18 0501  HGB 11.7*  --  11.9* 11.4*  HCT 40.6  --  42.9 40.8  PLT 114*  --  115* 104*  APTT 88*  --  66*  --   HEPARINUNFRC 1.03*  --  0.63 0.33  CREATININE 2.25*  --  2.21* 2.03*  TROPONINI  --  0.06*  --   --     Estimated Creatinine Clearance: 32.1 mL/min (A) (by C-G formula based on SCr of 2.03 mg/dL (H)).  Assessment: Patricia Wagner with h/o Afib on Eliquis PTA (on hold). Continuing IV heparin per HF team  Heparin level remains at goal. No s/sx of bleeding. No infusion issues.   Goal of Therapy:  Heparin level 0.3-0.7 units/ml Monitor platelets by anticoagulation protocol: Yes   Plan:  Continue heparin 1300 units/hr Daily heparin level, CBC Monitor for signs/symptoms of bleeding Resume Eliquis soon?  Patricia Wagner, [facility] Clinical Pharmacist Phone [phone]  08/11/2018 1:51 PM

## 2018-08-12 ENCOUNTER — Encounter (HOSPITAL_COMMUNITY): Payer: Medicare HMO

## 2018-08-12 ENCOUNTER — Encounter (HOSPITAL_COMMUNITY)
Admission: RE | Admit: 2018-08-12 | Discharge: 2018-08-12 | Disposition: A | Discharge status: Home / Self Care | Payer: Medicare HMO | Source: Ambulatory Visit | Attending: Internal Medicine | Admitting: Internal Medicine

## 2018-08-12 LAB — COMPREHENSIVE METABOLIC PANEL
ALT: 21 U/L (ref 0–44)
AST: 10 U/L — AB (ref 15–41)
Albumin: 2.6 g/dL — ABNORMAL LOW (ref 3.5–5.0)
Alkaline Phosphatase: 75 U/L (ref 38–126)
Anion gap: 8 (ref 5–15)
BUN: 36 mg/dL — AB (ref 8–23)
CHLORIDE: 94 mmol/L — AB (ref 98–111)
CO2: 41 mmol/L — AB (ref 22–32)
CREATININE: 1.73 mg/dL — AB (ref 0.44–1.00)
Calcium: 8.6 mg/dL — ABNORMAL LOW (ref 8.9–10.3)
GFR calc Af Amer: 35 mL/min — ABNORMAL LOW (ref >60)
GFR, EST NON AFRICAN AMERICAN: 30 mL/min — AB (ref >60)
Glucose, Bld: 86 mg/dL (ref 70–99)
Potassium: 3.8 mmol/L (ref 3.5–5.1)
Sodium: 143 mmol/L (ref 135–145)
Total Bilirubin: 0.9 mg/dL (ref 0.3–1.2)
Total Protein: 5.7 g/dL — ABNORMAL LOW (ref 6.5–8.1)

## 2018-08-12 LAB — CBC WITH DIFFERENTIAL/PLATELET
Basophils Absolute: 0.1 10*3/uL (ref 0.0–0.1)
Basophils Relative: 1 %
EOS PCT: 3 %
Eosinophils Absolute: 0.2 10*3/uL (ref 0.0–0.7)
HEMATOCRIT: 40.7 % (ref 36.0–46.0)
Hemoglobin: 11.3 g/dL — ABNORMAL LOW (ref 12.0–15.0)
LYMPHS ABS: 0.8 10*3/uL (ref 0.7–4.0)
Lymphocytes Relative: 12 %
MCH: 23.3 pg — ABNORMAL LOW (ref 26.0–34.0)
MCHC: 27.8 g/dL — AB (ref 30.0–36.0)
MCV: 83.9 fL (ref 78.0–100.0)
MONOS PCT: 11 %
Monocytes Absolute: 0.7 10*3/uL (ref 0.1–1.0)
NEUTROS ABS: 4.8 10*3/uL (ref 1.7–7.7)
Neutrophils Relative %: 73 %
Platelets: 108 10*3/uL — ABNORMAL LOW (ref 150–400)
RBC: 4.85 MIL/uL (ref 3.87–5.11)
RDW: 23.4 % — AB (ref 11.5–15.5)
WBC: 6.6 10*3/uL (ref 4.0–10.5)

## 2018-08-12 LAB — COOXEMETRY PANEL
Carboxyhemoglobin: 2.5 % — ABNORMAL HIGH (ref 0.5–1.5)
METHEMOGLOBIN: 1 % (ref 0.0–1.5)
O2 Saturation: 74.9 %
Total hemoglobin: 11.6 g/dL — ABNORMAL LOW (ref 12.0–16.0)

## 2018-08-12 LAB — MAGNESIUM: Magnesium: 1.8 mg/dL (ref 1.7–2.4)

## 2018-08-12 MED ORDER — MAGNESIUM SULFATE IN D5W 1-5 GM/100ML-% IV SOLN
1.0000 g | Freq: Once | INTRAVENOUS | Status: AC
  Administered 2018-08-12: 1 g via INTRAVENOUS
  Filled 2018-08-12: qty 100

## 2018-08-12 MED ORDER — ACETAZOLAMIDE 250 MG PO TABS
250.0000 mg | ORAL_TABLET | Freq: Two times a day (BID) | ORAL | Status: AC
  Administered 2018-08-12 (×2): 250 mg via ORAL
  Filled 2018-08-12 (×3): qty 1

## 2018-08-12 MED ORDER — MAGNESIUM SULFATE 2 GM/50ML IV SOLN: 2.0000 g | Freq: Once | INTRAVENOUS | Status: DC

## 2018-08-12 MED ORDER — POTASSIUM CHLORIDE CRYS ER 20 MEQ PO TBCR
40.0000 meq | EXTENDED_RELEASE_TABLET | Freq: Once | ORAL | Status: AC
  Administered 2018-08-12: 40 meq via ORAL
  Filled 2018-08-12: qty 2

## 2018-08-12 NOTE — Progress Notes (Signed)
[place] TEAM 1 - Stepdown/ICU TEAM  Patricia Wagner  XNT:[id number] DOB: Jan 14, 1951 DOA: 08/05/2018 PCP: Biagio Borg, MD    Brief Narrative:   67 y/o female with a history of CVA, HTN, HLD, Diverticulosis w/ GIB, CKD stage IV, PAF on eliquis, severe RV dysfxn, and systolic heart failure (EF 15%) who developed acute respiratory failure with hypoxemia due to a heart failure exacerbation.   Significant Events: 9/12 admit  9/13 intubated 9/15 extubated  9/16 [facility] assumed care  9/18 back in afib and rate controlling with IV amio  Subjective:  Awake eating lookls comfy no cp No fever No n/v No weakness Asking about IJ line "I am NOT Getting shocked again" [when asked re DCCV]   Assessment & Plan:  Acute hypoxic respiratory failure - pulmonary edema Care as per CHF Team   Facial droop - slurred speech Exact etiology not clear - resolved - CT head and MRI w/o acute findings - ?recurdescence of prior stroke deficits--no new issues today and seems improved form pior  Acute decompensated biventricular systolic heart failure Care as per CHF Team - possible Tachy-mediated? EF 40-45% per bedside echo per CHF Team - diuretics now po is minus 17 L so far, baseline wgt  ~102kg currently is 95--Meds as per cardiology and diuresis per them CVP 10  Persistent Atrial fibrillation--in and out of AFIB on monitors this am with episodic Vtach 4-5 bts On amio and IV heparin - RVR has historically rapidly led to cardiogenic shock/severe acute CHF exac - has failed 2 prior attempts at [place] (May and June 2018) - care per CHF Team - CHA2DS2-VASc score is 5 - in NSR presently - on IV amio + heparin  Called as the patient flipped back into rapid A. fib- mag repleted to only 1.8 with oral repletion--giving 1 gm now  ?AFIB team options???  impaiared glucose tolerance-A1c was 6.4-needs outpatient discussion regarding the same-glycemic control as PRN  Acute kidney injury + Cardio-renal syndrome as  evidenced by improvement Cr with diuretics CKD Stage IV creatinine on admission 65/3.7 Baseline crt  increased in last month to ~2.0-2.4 - crt today is 2.03-->1.73 after good diuresis  Contraction Alkalosis--CO2 now 41--monitor MS closely  Strep pneumo tracheobronchitis  Trach asp cx + - IV abx added for now - plan for short course of 5 days ending on 9/20 ceftriaxone-stop date added  Mild transaminitis likely due to passive congestion in setting of RV failure - follow intermittently   Hypokalemia Supplement to goal of 4.0 -see above discussion regarding magnesium  Suspected sleep apnea Refused sleep study in the past  L breast papilloma To have lumpectomy per Dr. Brantley Stage when stable medically   DVT prophylaxis: IV heparin Code Status: DNR - NO CODE Family Communication: no family present at time of exam Disposition Plan: SDU   Consultants:  CHF Team  PCCM  Antimicrobials:  Unasyn 9/13  Rocephin 9/16 >  Objective: Blood pressure 112/67, pulse (!) 58, temperature 98.6 F (37 C), temperature source Oral, resp. rate 15, height 5\' 6"  (1.676 m), weight 95.9 kg, SpO2 94 %.  Intake/Output Summary (Last 24 hours) at 08/12/2018 0802 Last data filed at 08/12/2018 0600 Gross per 24 hour  Intake 1819.53 ml  Output 3550 ml  Net -1730.47 ml   Filed Weights   08/10/18 0313 08/11/18 0500 08/12/18 0500  Weight: 97.6 kg 95 kg 95.9 kg    Examination:  Awake pleasant alert  eomi ncat Sinus with sinus arrythmia abd soft  nt nd Le soft nt nd no swelling Clear no added sound jvd hard to decipher Neuro intact--no focal deficit   CBC: Recent Labs  Lab 08/05/18 2047  08/10/18 0337 08/11/18 0501 08/12/18 0510  WBC 5.6   < > 8.5 7.5 6.6  NEUTROABS 4.3  --   --   --  PENDING  HGB 11.4*   < > 11.9* 11.4* 11.3*  HCT 40.5   < > 42.9 40.8 40.7  MCV 83.0   < > 82.5 83.1 83.9  PLT 194   < > 115* 104* 108*   < > = values in this interval not displayed.   Basic Metabolic  Panel: Recent Labs  Lab 08/06/18 1200 08/06/18 1626 08/07/18 0358  08/10/18 0337 08/11/18 0501 08/12/18 0510  NA  --   --  144   < > 143 146* 143  K  --   --  2.7*   < > 3.8 3.7 3.8  CL  --   --  101   < > 96* 94* 94*  CO2  --   --  34*   < > 38* 39* 41*  GLUCOSE  --   --  128*   < > 112* 87 86  BUN  --   --  60*   < > 39* 37* 36*  CREATININE  --   --  3.03*   < > 2.21* 2.03* 1.73*  CALCIUM  --   --  8.8*   < > 8.5* 8.8* 8.6*  MG 2.6* 2.6* 2.6*  --   --  1.9 1.8  PHOS 4.6 4.3 3.5  --   --   --   --    < > = values in this interval not displayed.   GFR: Estimated Creatinine Clearance: 37.8 mL/min (A) (by C-G formula based on SCr of 1.73 mg/dL (H)).  Liver Function Tests: Recent Labs  Lab 08/05/18 2047 08/06/18 0614 08/11/18 0501 08/12/18 0510  AST 58* 51* 9* 10*  ALT 92* 84* 24 21  ALKPHOS 174* 172* 81 75  BILITOT 4.9* 3.7* 1.2 0.9  PROT 6.6 6.4* 6.0* 5.7*  ALBUMIN 3.5 3.4* 2.6* 2.6*   Recent Labs  Lab 08/05/18 2356  LIPASE 23    Coagulation Profile: Recent Labs  Lab 08/05/18 2047  INR 2.46    Cardiac Enzymes: Recent Labs  Lab 08/05/18 2356 08/06/18 0614 08/06/18 1200 08/07/18 0358 08/10/18 0016  CKTOTAL  --   --   --  17*  --   TROPONINI 0.13* 0.16* 0.16*  --  0.06*     CBG: Recent Labs  Lab 08/08/18 2359 08/09/18 0343 08/09/18 0802 08/09/18 1136 08/09/18 1528  GLUCAP 142* 98 86 78 117*       Scheduled Meds: . apixaban  5 mg Oral BID  . furosemide  60 mg Intravenous BID  . magnesium oxide  400 mg Oral BID  . sodium chloride flush  3 mL Intravenous Q12H   Continuous Infusions: . sodium chloride    . amiodarone 60 mg/hr (08/12/18 0725)  . cefTRIAXone (ROCEPHIN)  IV Stopped (08/11/18 1855)     LOS: 7 days    Verneita Griffes, MD [facility] 8:02 AM   If 7PM-7AM, please contact night-coverage per Amion 08/12/2018, 8:02 AM

## 2018-08-12 NOTE — Progress Notes (Addendum)
Advanced Heart Failure Rounding Note  PCP-Cardiologist: Glori Bickers, MD   Subjective:    Extubated 08/07/18. Was able to ambulated with PT.   Coox 75% this am. Brisk diuresis with IV lasix. Weight up 2 lbs overnight (down 25 lbs total).   Cr 2.25 -> 2.21 -> 2.03 -> 1.73. CVP 15. K 3.8, mag 1.8. CO2 41. SBP improved 100-110s  Back into afib yesterday. Received amio bolus x2 and amio drip increased to 60 mg overnight. Converted back into sinus around 10 pm last night.  Feeling fine this morning. Denies CP or SOB. No bleeding.   Bedside echo completed by Dr [name] 08/06/18 which showed EF 40-45%, but RV severely reduced.   Objective:   Weight Range: 95.9 kg Body mass index is 34.12 kg/m.   Vital Signs:   Temp:  [98.5 F (36.9 C)-98.9 F (37.2 C)] 98.7 F (37.1 C) (09/19 0815) Pulse Rate:  [57-115] 58 (09/19 0400) Resp:  [15-27] 15 (09/19 0400) BP: (90-113)/(58-81) 112/67 (09/19 0400) SpO2:  [92 %-98 %] 94 % (09/19 0400) FiO2 (%):  [40 %] 40 % (09/19 0000) Weight:  [95.9 kg] 95.9 kg (09/19 0500) Last BM Date: 08/11/18  Weight change: Filed Weights   08/10/18 0313 08/11/18 0500 08/12/18 0500  Weight: 97.6 kg 95 kg 95.9 kg   Intake/Output:  Intake/Output Summary (Last 24 hours) at 08/12/2018 0954 Last data filed at 08/12/2018 0600 Gross per 24 hour  Intake 1510.32 ml  Output 3470 ml  Net -1959.68 ml    Physical Exam    General: No resp difficulty. HEENT: Normal Neck: Supple. JVP 15. Carotids 2+ bilat; no bruits. No thyromegaly or nodule noted. Cor: PMI nondisplaced. RRR, +RV lift, 2/6 TR Lungs: CTAB, normal effort. On RA Abdomen: Soft, non-tender, non-distended, no HSM. No bruits or masses. +BS  Extremities: No cyanosis, clubbing, or rash. R and LLE 1+ edema.  Neuro: Alert & orientedx3, cranial nerves grossly intact. moves all 4 extremities w/o difficulty. Affect pleasant  Telemetry   Afib until 20:30, then converted into NSR/SB 50-60s. Personally  reviewed.   EKG    No new tracings.    Labs    CBC Recent Labs    08/11/18 0501 08/12/18 0510  WBC 7.5 6.6  NEUTROABS  --  4.8  HGB 11.4* 11.3*  HCT 40.8 40.7  MCV 83.1 83.9  PLT 104* 539*   Basic Metabolic Panel Recent Labs    08/11/18 0501 08/12/18 0510  NA 146* 143  K 3.7 3.8  CL 94* 94*  CO2 39* 41*  GLUCOSE 87 86  BUN 37* 36*  CREATININE 2.03* 1.73*  CALCIUM 8.8* 8.6*  MG 1.9 1.8   Liver Function Tests Recent Labs    08/11/18 0501 08/12/18 0510  AST 9* 10*  ALT 24 21  ALKPHOS 81 75  BILITOT 1.2 0.9  PROT 6.0* 5.7*  ALBUMIN 2.6* 2.6*   No results for input(s): LIPASE, AMYLASE in the last 72 hours. Cardiac Enzymes Recent Labs    08/10/18 0016  TROPONINI 0.06*   BNP: BNP (last 3 results) Recent Labs    08/05/18 2155  BNP 3,951.4*    ProBNP (last 3 results) No results for input(s): PROBNP in the last 8760 hours.   D-Dimer No results for input(s): DDIMER in the last 72 hours. Hemoglobin A1C No results for input(s): HGBA1C in the last 72 hours. Fasting Lipid Panel No results for input(s): CHOL, HDL, LDLCALC, TRIG, CHOLHDL, LDLDIRECT in the last 72 hours. Thyroid Function  Tests No results for input(s): TSH, T4TOTAL, T3FREE, THYROIDAB in the last 72 hours.  Invalid input(s): FREET3  Other results:  Imaging   No results found.  Medications:    Scheduled Medications: . apixaban  5 mg Oral BID  . furosemide  60 mg Intravenous BID  . magnesium oxide  400 mg Oral BID  . sodium chloride flush  3 mL Intravenous Q12H    Infusions: . sodium chloride    . amiodarone 60 mg/hr (08/12/18 0725)  . cefTRIAXone (ROCEPHIN)  IV Stopped (08/11/18 1855)  . magnesium sulfate 1 - 4 g bolus IVPB 1 g (08/12/18 0918)    PRN Medications: sodium chloride, fentaNYL (SUBLIMAZE) injection, sodium chloride flush  Patient Profile   Patricia Wagner is a 67 y.o. female with a past medical history of paroxysmalatrial fibrillation, NICM cardiomyopathy,  Biventricular CHF (LVEF 15% and RV severely reduced by TEE 03/25/17), HTN, hx of recurrent GIB, COPD, anemia, chronic foley catheter, and HLD.   Admitted with facial droop/slurred speech and hypoxia. Found to be in A/C hypoxic respiratory failure and biventricular HF.   Assessment/Plan   1. A/C hypoxic respiratory failure in COPD - Intubated 9/13, extubated 08/07/18.  - Weight down 25 lbs total with IV diuresis. On RA  2. Facial droop/slurred speech - Head CT negative - MRI showed no acute changes.  - Urine culture with rare bacteria + casts.  - Alert and oriented today. No change  3. Acute on chronic biventricular CHF due to NICM (suspect AF cardiomyopathy)  - TEE 03/25/17 with LVEF 15% and severely reduced RV function.  - Echo 06/2017 EF 30-35% (up from 15% in setting of AF in 5/18).  - Echo 02/2018: EF 40-45%, grade 1 DD, RV normal - Amyloid w/u negative with negative fat pad biopsy and PYP scan  - Volume status remains elevated.  CVP 15. Coox 75% - Continue IV lasix 60 mg BID. May be able to switch to PO tomorrow. Weight up 2 lbs and CVP still elevated today. Give diamox today as well for bicarb 41. - Bedside echo completed by Dr [name] 9/12, which showed EF 40-45%, but RV severely reduced. Low suspicion for PE since she is on chronic AC.  - Imdur, hydral, and coreg on hold. SBP improved 100-110s -Off spiro and Entresto with CKD  4. Persistent Afib: Cannot tolerate AF with recurrent shock. S/p DCCV on 5/2 (she stopped Amio after this) and DCCV on 05/06/17 and again 05/12/17. - She absolutely cannot tolerate AF with recurrent cardiogenic shock when AF recurs. - Back in NSR today. Coox stable 75% - Continue amiodarone drip for today. - Ranexa on hold with AKI.  - Continue Eliquis 5 mg BID - EP follows outpatient. Holding off on ablation for now because she needs lumpectomy.  5. Suspected Sleep Apnea: - Refuses sleep study. No change.  6. AKI on CKD -  Baseline 1.6-1.8 -  Cr trending down. 3.89 -> 3.03 -> 2.65 -> 2.25 -> 2.21 -> 2.03 -> 1.73  7. HTN - Stable. SBP 100-110s  8. Anemia - Hgb stable at 11.3  9. Hypomagnesiumia - Mag 1.8. Supp.  Medication concerns reviewed with patient and pharmacy team. Barriers identified: None at this time.   Length of Stay: [place], NP  08/12/2018, 9:54 AM  Advanced Heart Failure Team Pager [phone] (M-F; 7a - 4p)  Please contact [facility] for night-coverage after hours (4p -7a ) and weekends on [email or web address]  Patient seen and examined  with the above-signed Advanced Practice Provider and/or Housestaff. I personally reviewed laboratory data, imaging studies and relevant notes. I independently examined the patient and formulated the important aspects of the plan. I have edited the note to reflect any of my changes or salient points. I have personally discussed the plan with the patient and/or family.  She is back in NSR today. Feeling better. CVP still elevated. Creatinine improving. Would continue IV amio one more day. Continue IV diuresis. Agree with diamox for metabolic alkalosis. Now back on Eliquis. No bleeding.   Glori Bickers, MD  4:48 PM

## 2018-08-12 NOTE — Progress Notes (Signed)
Pulmonary Individual Treatment Plan  Patient Details  Name: Patricia Wagner MRN: [id number] Date of Birth: 08/15/1951 Referring Provider:     Pulmonary Rehab Walk Test from 07/15/2018 in [facility]  Referring Provider  [name]      Initial Encounter Date:    Pulmonary Rehab Walk Test from 07/15/2018 in [facility]  Date  07/19/18      Visit Diagnosis: Chronic systolic CHF (congestive heart failure) ([place])  Patient's Home Medications on Admission:  No current facility-administered medications for this encounter.  No current outpatient medications on file.  Facility-Administered Medications Ordered in Other Encounters:  .  0.9 %  sodium chloride infusion, 250 mL, Intravenous, PRN, Georgiana Shore, NP .  [EXPIRED] amiodarone (NEXTERONE PREMIX) 360-4.14 MG/200ML-% (1.8 mg/mL) IV infusion, 60 mg/hr, Intravenous, Continuous, Last Rate: 33.3 mL/hr at 08/06/18 1400, 60 mg/hr at 08/06/18 1400 **FOLLOWED BY** amiodarone (NEXTERONE PREMIX) 360-4.14 MG/200ML-% (1.8 mg/mL) IV infusion, 30 mg/hr, Intravenous, Continuous, Georgiana Shore, NP, Last Rate: 16.67 mL/hr at 08/12/18 1531, 30 mg/hr at 08/12/18 1531 .  apixaban (ELIQUIS) tablet 5 mg, 5 mg, Oral, BID, Nita Sells, MD, 5 mg at 08/12/18 2158 .  cefTRIAXone (ROCEPHIN) 2 g in sodium chloride 0.9 % 100 mL IVPB, 2 g, Intravenous, Q24H, Cherene Altes, MD, Last Rate: 200 mL/hr at 08/12/18 1531, 2 g at 08/12/18 1531 .  fentaNYL (SUBLIMAZE) injection 12.5-25 mcg, 12.5-25 mcg, Intravenous, Q2H PRN, McQuaid, Douglas B, MD .  furosemide (LASIX) injection 60 mg, 60 mg, Intravenous, BID, Shirley Friar, PA-C, 60 mg at 08/12/18 1728 .  magnesium oxide (MAG-OX) tablet 400 mg, 400 mg, Oral, BID, Verlon Au, Jai-Gurmukh, MD, 400 mg at 08/12/18 2158 .  sodium chloride flush (NS) 0.9 % injection 3 mL, 3 mL, Intravenous, Q12H, Georgiana Shore, NP, 3 mL at 08/12/18 2159 .  sodium  chloride flush (NS) 0.9 % injection 3 mL, 3 mL, Intravenous, PRN, Georgiana Shore, NP  Past Medical History: Past Medical History:  Diagnosis Date  . Acute blood loss anemia 03/05/2018  . Acute hypercapnic respiratory failure ([place]) 04/30/2017  . Acute renal failure superimposed on chronic kidney disease ([place]) 10/20/2016  . Arthritis   . Atrial fibrillation ([place])    a. s/p multiple cardioversions; failed tikosyn/sotalol.  . Bradycardia 11/20/2016  . CAD in native artery, s/p cardiac cath with non obstructive CAD 10/24/2016  . Cerebrovascular accident (CVA) due to embolism of left cerebellar artery ([place])   . CHF (congestive heart failure) ([place])   . Chronic anticoagulation 03/15/2018  . Chronic atrial fibrillation ([place]) 03/05/2018  . Chronic systolic (congestive) heart failure ([place]) 03/05/2018  . CKD (chronic kidney disease) stage 3, GFR 30-59 ml/min (HCC)   . Coagulopathy ([place]) 03/05/2018  . DIVERTICULITIS, HX OF 07/25/2007  . Diverticulosis of colon with hemorrhage 07/22/2007   Qualifier: Diagnosis of  By: [name]    . DIVERTICULOSIS, COLON 07/22/2007  . Dyspnea   . Dysuria 07/21/2017  . Edema, peripheral    a. chronic BLE edema, R>L. Prior trauma from dog attack and accident.  . Essential hypertension 07/22/2007   Qualifier: Diagnosis of  By: [name]    . HLD (hyperlipidemia) 02/03/2008   Qualifier: Diagnosis of  By: [name], [name]   . HYPERLIPIDEMIA 02/03/2008  . Hypersomnia    declines w/u  . Hypertension   . Hypotension (arterial) 04/30/2017  . MENOPAUSAL DISORDER 01/09/2011  . Morbid obesity ([place]) 07/22/2007  .  NICM (nonischemic cardiomyopathy) ([place]) 10/24/2016  . PAF (paroxysmal atrial fibrillation) ([place])   . Raynaud's syndrome 07/22/2007  . Stroke ([place]) 2017  . Suspected sleep apnea 05/20/2017  . THYROID NODULE, RIGHT 01/04/2010  . VITAMIN D DEFICIENCY 01/09/2011   Qualifier: Diagnosis of  By: [name], [name]     Tobacco Use: Social History   Tobacco Use   Smoking Status Former Smoker  . Packs/day: 0.50  . Types: Cigarettes  . Start date: 2019  Smokeless Tobacco Never Used  Tobacco Comment   restarted back this year    Labs: Recent Review Flowsheet Data    Labs for ITP Cardiac and Pulmonary Rehab Latest Ref Rng & Units 08/08/2018 08/09/2018 08/10/2018 08/11/2018 08/12/2018   Cholestrol 0 - 200 mg/dL - - - - -   LDLCALC 0 - 99 mg/dL - - - - -   LDLDIRECT mg/dL - - - - -   HDL >40 mg/dL - - - - -   Trlycerides <150 mg/dL - - - - -   Hemoglobin A1c 4.8 - 5.6 % - - - - -   PHART 7.350 - 7.450 - - - - -   PCO2ART 32.0 - 48.0 mmHg - - - - -   HCO3 20.0 - 28.0 mmol/L - - - - -   TCO2 22 - 32 mmol/L - - - - -   ACIDBASEDEF 0.0 - 2.0 mmol/L - - - - -   O2SAT % 49.0 60.7 65.0 66.0 74.9      Capillary Blood Glucose: Lab Results  Component Value Date   GLUCAP 117 (H) 08/09/2018   GLUCAP 78 08/09/2018   GLUCAP 86 08/09/2018   GLUCAP 98 08/09/2018   GLUCAP 142 (H) 08/08/2018     Pulmonary Assessment Scores: Pulmonary Assessment Scores    Row Name 07/14/18 1513 07/19/18 1420 07/19/18 1439     ADL UCSD   ADL Phase  -  -  Entry   SOB Score total  27  -  -     CAT Score   CAT Score  28  -  -     mMRC Score   mMRC Score  -  1  1      Pulmonary Function Assessment:   Exercise Target Goals: Exercise Program Goal: Individual exercise prescription set using results from initial 6 min walk test and THRR while considering  patient's activity barriers and safety.   Exercise Prescription Goal: Initial exercise prescription builds to 30-45 minutes a day of aerobic activity, 2-3 days per week.  Home exercise guidelines will be given to patient during program as part of exercise prescription that the participant will acknowledge.  Activity Barriers & Risk Stratification: Activity Barriers & Cardiac Risk Stratification - 07/07/18 1430      Activity Barriers & Cardiac Risk Stratification   Activity Barriers  Assistive Device;Shortness  of Breath;Joint Problems;Back Problems   bilat knee ( need surgery)   Cardiac Risk Stratification  High       6 Minute Walk: 6 Minute Walk    Row Name 07/19/18 1439         6 Minute Walk   Phase  Initial     Distance  500 feet     Walk Time  6 minutes     # of Rest Breaks  0     MPH  0.94     METS  1.77     RPE  12     Perceived  Dyspnea   1     Symptoms  Yes (comment)     Comments  Used wheelchair, 6/10 both knee pain, 3 cigarettes today     Resting HR  64 bpm     Resting BP  108/70     Resting Oxygen Saturation   90 %     Exercise Oxygen Saturation  during 6 min walk  88 %     Max Ex. HR  83 bpm     Max Ex. BP  138/80       Interval HR   1 Minute HR  77     2 Minute HR  82     3 Minute HR  82     4 Minute HR  83     5 Minute HR  83     6 Minute HR  81     2 Minute Post HR  72     Interval Heart Rate?  Yes       Interval Oxygen   Interval Oxygen?  Yes     Baseline Oxygen Saturation %  90 %     1 Minute Oxygen Saturation %  90 %     1 Minute Liters of Oxygen  0 L     2 Minute Oxygen Saturation %  88 %     2 Minute Liters of Oxygen  0 L     3 Minute Oxygen Saturation %  88 %     3 Minute Liters of Oxygen  0 L     4 Minute Oxygen Saturation %  89 %     4 Minute Liters of Oxygen  0 L     5 Minute Oxygen Saturation %  89 %     5 Minute Liters of Oxygen  0 L     6 Minute Oxygen Saturation %  88 %     6 Minute Liters of Oxygen  0 L     2 Minute Post Oxygen Saturation %  87 %     2 Minute Post Liters of Oxygen  9 L        Oxygen Initial Assessment: Oxygen Initial Assessment - 07/19/18 1438      Initial 6 min Walk   Oxygen Used  None      Program Oxygen Prescription   Program Oxygen Prescription  None       Oxygen Re-Evaluation: Oxygen Re-Evaluation    Row Name 08/10/18 0808             Program Oxygen Prescription   Program Oxygen Prescription  None         Home Oxygen   Home Oxygen Device  None       Sleep Oxygen Prescription  None        Home Exercise Oxygen Prescription  None       Home at Rest Exercise Oxygen Prescription  None          Oxygen Discharge (Final Oxygen Re-Evaluation): Oxygen Re-Evaluation - 08/10/18 0808      Program Oxygen Prescription   Program Oxygen Prescription  None      Home Oxygen   Home Oxygen Device  None    Sleep Oxygen Prescription  None    Home Exercise Oxygen Prescription  None    Home at Rest Exercise Oxygen Prescription  None       Initial Exercise Prescription: Initial Exercise Prescription - 07/19/18 1400      Date  of Initial Exercise RX and Referring Provider   Date  07/19/18    Referring Provider  [name]      NuStep   Level  2    SPM  80    Minutes  17    METs  1.5      Prescription Details   Frequency (times per week)  2    Duration  Progress to 45 minutes of aerobic exercise without signs/symptoms of physical distress      Intensity   THRR 40-80% of Max Heartrate  62-125    Ratings of Perceived Exertion  11-13    Perceived Dyspnea  0-4      Progression   Progression  Continue progressive overload as per policy without signs/symptoms or physical distress.      Resistance Training   Training Prescription  Yes    Weight  ORANGE BANDS    Reps  10-15       Perform Capillary Blood Glucose checks as needed.  Exercise Prescription Changes:  Exercise Prescription Changes    Row Name 07/27/18 1600 08/03/18 1725           Response to Exercise   Blood Pressure (Admit)  100/68  130/70      Blood Pressure (Exercise)  124/70  110/80      Blood Pressure (Exit)  120/80  122/60      Heart Rate (Admit)  93 bpm  60 bpm      Heart Rate (Exercise)  101 bpm  106 bpm      Heart Rate (Exit)  - would not register  55 bpm      Oxygen Saturation (Admit)  93 %  90 %      Oxygen Saturation (Exercise)  91 %  94 %      Oxygen Saturation (Exit)  - would not register  96 %      Rating of Perceived Exertion (Exercise)  11  9      Perceived Dyspnea (Exercise)  1  1       Duration  Progress to 45 minutes of aerobic exercise without signs/symptoms of physical distress  Progress to 45 minutes of aerobic exercise without signs/symptoms of physical distress      Intensity  Other (comment) 40-80% of HRR  THRR unchanged        Progression   Progression  Continue to progress workloads to maintain intensity without signs/symptoms of physical distress.  Continue to progress workloads to maintain intensity without signs/symptoms of physical distress.        Resistance Training   Training Prescription  Yes  Yes      Weight  orange bands  orange bands      Reps  10-15  10-15      Time  10 Minutes  10 Minutes        NuStep   Level  2  2      SPM  80  80      Minutes  51  17      METs  1.5  1.3         Exercise Comments:   Exercise Goals and Review:  Exercise Goals    Row Name 07/07/18 1431             Exercise Goals   Increase Physical Activity  Yes       Intervention  Provide advice, education, support and counseling about physical activity/exercise needs.;Develop an individualized exercise prescription for  aerobic and resistive training based on initial evaluation findings, risk stratification, comorbidities and participant's personal goals.       Expected Outcomes  Short Term: Attend rehab on a regular basis to increase amount of physical activity.;Long Term: Add in home exercise to make exercise part of routine and to increase amount of physical activity.;Long Term: Exercising regularly at least 3-5 days a week.       Increase Strength and Stamina  Yes       Intervention  Provide advice, education, support and counseling about physical activity/exercise needs.;Develop an individualized exercise prescription for aerobic and resistive training based on initial evaluation findings, risk stratification, comorbidities and participant's personal goals.       Expected Outcomes  Short Term: Increase workloads from initial exercise prescription for resistance,  speed, and METs.;Short Term: Perform resistance training exercises routinely during rehab and add in resistance training at home;Long Term: Improve cardiorespiratory fitness, muscular endurance and strength as measured by increased METs and functional capacity (6MWT)       Able to understand and use rate of perceived exertion (RPE) scale  Yes       Intervention  Provide education and explanation on how to use RPE scale       Expected Outcomes  Short Term: Able to use RPE daily in rehab to express subjective intensity level;Long Term:  Able to use RPE to guide intensity level when exercising independently       Able to understand and use Dyspnea scale  Yes       Intervention  Provide education and explanation on how to use Dyspnea scale       Expected Outcomes  Short Term: Able to use Dyspnea scale daily in rehab to express subjective sense of shortness of breath during exertion;Long Term: Able to use Dyspnea scale to guide intensity level when exercising independently       Knowledge and understanding of Target Heart Rate Range (THRR)  Yes       Intervention  Provide education and explanation of THRR including how the numbers were predicted and where they are located for reference       Expected Outcomes  Short Term: Able to state/look up THRR;Short Term: Able to use daily as guideline for intensity in rehab;Long Term: Able to use THRR to govern intensity when exercising independently       Understanding of Exercise Prescription  Yes       Intervention  Provide education, explanation, and written materials on patient's individual exercise prescription       Expected Outcomes  Short Term: Able to explain program exercise prescription;Long Term: Able to explain home exercise prescription to exercise independently          Exercise Goals Re-Evaluation : Exercise Goals Re-Evaluation    Row Name 08/10/18 0809             Exercise Goal Re-Evaluation   Exercise Goals Review  Increase Physical  Activity;Increase Strength and Stamina;Able to understand and use rate of perceived exertion (RPE) scale;Able to understand and use Dyspnea scale;Knowledge and understanding of Target Heart Rate Range (THRR);Understanding of Exercise Prescription       Comments  Patient has only attended 4 rehab sessions. Is currently admitted in the hopsital for heart failure and respiratory distress.           Discharge Exercise Prescription (Final Exercise Prescription Changes): Exercise Prescription Changes - 08/03/18 1725      Response to Exercise   Blood Pressure (  Admit)  130/70    Blood Pressure (Exercise)  110/80    Blood Pressure (Exit)  122/60    Heart Rate (Admit)  60 bpm    Heart Rate (Exercise)  106 bpm    Heart Rate (Exit)  55 bpm    Oxygen Saturation (Admit)  90 %    Oxygen Saturation (Exercise)  94 %    Oxygen Saturation (Exit)  96 %    Rating of Perceived Exertion (Exercise)  9    Perceived Dyspnea (Exercise)  1    Duration  Progress to 45 minutes of aerobic exercise without signs/symptoms of physical distress    Intensity  THRR unchanged      Progression   Progression  Continue to progress workloads to maintain intensity without signs/symptoms of physical distress.      Resistance Training   Training Prescription  Yes    Weight  orange bands    Reps  10-15    Time  10 Minutes      NuStep   Level  2    SPM  80    Minutes  17    METs  1.3       Nutrition:  Target Goals: Understanding of nutrition guidelines, daily intake of sodium <1584m, cholesterol <2058m calories 30% from fat and 7% or less from saturated fats, daily to have 5 or more servings of fruits and vegetables.  Biometrics:    Nutrition Therapy Plan and Nutrition Goals: Nutrition Therapy & Goals - 07/07/18 1603      Nutrition Therapy   Diet  consisitent carbohydrate heart healthy      Personal Nutrition Goals   Nutrition Goal  Pt to identify and limit food sources of sodium, saturated fat, trans fats,  refined carbohydrates.    Personal Goal #2  Identify food quantities necessary to achieve wt loss of  -2# per week to a goal wt loss of 2.7-10.9 kg (6-24 lb) at graduation from pulmonary rehab.    Personal Goal #3  The pt will recognize symptoms that can interfere with adequate oral intake      Intervention Plan   Intervention  Prescribe, educate and counsel regarding individualized specific dietary modifications aiming towards targeted core components such as weight, hypertension, lipid management, diabetes, heart failure and other comorbidities.    Expected Outcomes  Short Term Goal: Understand basic principles of dietary content, such as calories, fat, sodium, cholesterol and nutrients.       Nutrition Assessments: Nutrition Assessments - 07/07/18 1604      Rate Your Plate Scores   Pre Score  46       Nutrition Goals Re-Evaluation:   Nutrition Goals Discharge (Final Nutrition Goals Re-Evaluation):   Psychosocial: Target Goals: Acknowledge presence or absence of significant depression and/or stress, maximize coping skills, provide positive support system. Participant is able to verbalize types and ability to use techniques and skills needed for reducing stress and depression.  Initial Review & Psychosocial Screening: Initial Psych Review & Screening - 07/07/18 1433      Initial Review   Current issues with  None Identified      Family Dynamics   Good Support System?  Yes    Comments  [email or web address]      Barriers   Psychosocial barriers to participate in program  There are no identifiable barriers or psychosocial needs.      Screening Interventions   Interventions  Encouraged to exercise    Expected Outcomes  Short Term goal: Identification and review with participant of any Quality of Life or Depression concerns found by scoring the questionnaire.;Long Term goal: The participant improves quality of Life and PHQ9 Scores as seen by post scores  and/or verbalization of changes       Quality of Life Scores:  Scores of 19 and below usually indicate a poorer quality of life in these areas.  A difference of  2-3 points is a clinically meaningful difference.  A difference of 2-3 points in the total score of the Quality of Life Index has been associated with significant improvement in overall quality of life, self-image, physical symptoms, and general health in studies assessing change in quality of life.  PHQ-9: Recent Review Flowsheet Data    Depression screen [facility] 2/9 07/07/2018 12/20/2017   Decreased Interest 0 0   Down, Depressed, Hopeless 0 1   PHQ - 2 Score 0 1   Altered sleeping 1 -   Tired, decreased energy 0 -   Change in appetite 1 -   Feeling bad or failure about yourself  0 -   Trouble concentrating 0 -   Moving slowly or fidgety/restless 1 -   Suicidal thoughts 0 -   PHQ-9 Score 3 -   Difficult doing work/chores Not difficult at all -     Interpretation of Total Score  Total Score Depression Severity:  1-4 = Minimal depression, 5-9 = Mild depression, 10-14 = Moderate depression, 15-19 = Moderately severe depression, 20-27 = Severe depression   Psychosocial Evaluation and Intervention: Psychosocial Evaluation - 08/12/18 2237      Psychosocial Evaluation & Interventions   Comments  no identifible barriers    Expected Outcomes  Continue to display mental health well being    Continue Psychosocial Services   Follow up required by staff       Psychosocial Re-Evaluation: Psychosocial Re-Evaluation    [place] Name 08/12/18 2237             Psychosocial Re-Evaluation   Current issues with  None Identified       Comments  Pt has completed 4 exercise sessions.  Pt observed interacting with staff and some fellow participants. Pt with insonsisent attendance and is presently in the hospital.       Expected Outcomes  Pt will continue to display mental well being.       Interventions  Encouraged to attend Pulmonary  Rehabilitation for the exercise       Continue Psychosocial Services   Follow up required by staff          Psychosocial Discharge (Final Psychosocial Re-Evaluation): Psychosocial Re-Evaluation - 08/12/18 2237      Psychosocial Re-Evaluation   Current issues with  None Identified    Comments  Pt has completed 4 exercise sessions.  Pt observed interacting with staff and some fellow participants. Pt with insonsisent attendance and is presently in the hospital.    Expected Outcomes  Pt will continue to display mental well being.    Interventions  Encouraged to attend Pulmonary Rehabilitation for the exercise    Continue Psychosocial Services   Follow up required by staff       Education: Education Goals: Education classes will be provided on a weekly basis, covering required topics. Participant will state understanding/return demonstration of topics presented.  Learning Barriers/Preferences: Learning Barriers/Preferences - 07/07/18 1435      Learning Barriers/Preferences   Learning Barriers  Sight    Learning Preferences  Group Instruction;Individual Instruction;Video;Verbal Instruction  Education Topics: Risk Factor Reduction:  -Group instruction that is supported by a PowerPoint presentation. Instructor discusses the definition of a risk factor, different risk factors for pulmonary disease, and how the heart and lungs work together.     Nutrition for Pulmonary Patient:  -Group instruction provided by PowerPoint slides, verbal discussion, and written materials to support subject matter. The instructor gives an explanation and review of healthy diet recommendations, which includes a discussion on weight management, recommendations for fruit and vegetable consumption, as well as protein, fluid, caffeine, fiber, sodium, sugar, and alcohol. Tips for eating when patients are short of breath are discussed.   PULMONARY REHAB OTHER RESPIRATORY from 08/03/2018 in [place]  Date  07/22/18  Instruction Review Code  1- Verbalizes Understanding      Pursed Lip Breathing:  -Group instruction that is supported by demonstration and informational handouts. Instructor discusses the benefits of pursed lip and diaphragmatic breathing and detailed demonstration on how to preform both.     Oxygen Safety:  -Group instruction provided by PowerPoint, verbal discussion, and written material to support subject matter. There is an overview of "What is Oxygen" and "Why do we need it".  Instructor also reviews how to create a safe environment for oxygen use, the importance of using oxygen as prescribed, and the risks of noncompliance. There is a brief discussion on traveling with oxygen and resources the patient may utilize.   Oxygen Equipment:  -Group instruction provided by [facility] Staff utilizing handouts, written materials, and equipment demonstrations.   Signs and Symptoms:  -Group instruction provided by written material and verbal discussion to support subject matter. Warning signs and symptoms of infection, stroke, and heart attack are reviewed and when to call the physician/911 reinforced. Tips for preventing the spread of infection discussed.   Advanced Directives:  -Group instruction provided by verbal instruction and written material to support subject matter. Instructor reviews Advanced Directive laws and proper instruction for filling out document.   Pulmonary Video:  -Group video education that reviews the importance of medication and oxygen compliance, exercise, good nutrition, pulmonary hygiene, and pursed lip and diaphragmatic breathing for the pulmonary patient.   Exercise for the Pulmonary Patient:  -Group instruction that is supported by a PowerPoint presentation. Instructor discusses benefits of exercise, core components of exercise, frequency, duration, and intensity of an exercise routine, importance of utilizing pulse  oximetry during exercise, safety while exercising, and options of places to exercise outside of rehab.     Pulmonary Medications:  -Verbally interactive group education provided by instructor with focus on inhaled medications and proper administration.   PULMONARY REHAB OTHER RESPIRATORY from 08/03/2018 in [place]  Date  08/03/18  Educator  pharm  Instruction Review Code  2- Demonstrated Understanding      Anatomy and Physiology of the Respiratory System and Intimacy:  -Group instruction provided by PowerPoint, verbal discussion, and written material to support subject matter. Instructor reviews respiratory cycle and anatomical components of the respiratory system and their functions. Instructor also reviews differences in obstructive and restrictive respiratory diseases with examples of each. Intimacy, Sex, and Sexuality differences are reviewed with a discussion on how relationships can change when diagnosed with pulmonary disease. Common sexual concerns are reviewed.   MD DAY -A group question and answer session with a medical doctor that allows participants to ask questions that relate to their pulmonary disease state.   OTHER EDUCATION -Group or individual verbal, written, or  video instructions that support the educational goals of the pulmonary rehab program.   Holiday Eating Survival Tips:  -Group instruction provided by PowerPoint slides, verbal discussion, and written materials to support subject matter. The instructor gives patients tips, tricks, and techniques to help them not only survive but enjoy the holidays despite the onslaught of food that accompanies the holidays.   Knowledge Questionnaire Score: Knowledge Questionnaire Score - 07/28/18 1454      Knowledge Questionnaire Score   Pre Score  12/18       Core Components/Risk Factors/Patient Goals at Admission: Personal Goals and Risk Factors at Admission - 07/07/18 1436      Core  Components/Risk Factors/Patient Goals on Admission    Weight Management  Yes;Weight Loss    Intervention  Weight Management: Develop a combined nutrition and exercise program designed to reach desired caloric intake, while maintaining appropriate intake of nutrient and fiber, sodium and fats, and appropriate energy expenditure required for the weight goal.;Weight Management: Provide education and appropriate resources to help participant work on and attain dietary goals.;Weight Management/Obesity: Establish reasonable short term and long term weight goals.;Obesity: Provide education and appropriate resources to help participant work on and attain dietary goals.    Admit Weight  234 lb 2.1 oz (106.2 kg)    Goal Weight: Short Term  230 lb (104.3 kg)    Goal Weight: Long Term  225 lb (102.1 kg)    Expected Outcomes  Short Term: Continue to assess and modify interventions until short term weight is achieved;Long Term: Adherence to nutrition and physical activity/exercise program aimed toward attainment of established weight goal;Weight Loss: Understanding of general recommendations for a balanced deficit meal plan, which promotes 1-2 lb weight loss per week and includes a negative energy balance of [id number] kcal/d;Understanding recommendations for meals to include 15-35% energy as protein, 25-35% energy from fat, 35-60% energy from carbohydrates, less than 217m of dietary cholesterol, 20-35 gm of total fiber daily;Understanding of distribution of calorie intake throughout the day with the consumption of 4-5 meals/snacks    Tobacco Cessation  Yes    Number of packs per day  1/ 2 pack day    Intervention  Assist the participant in steps to quit. Provide individualized education and counseling about committing to Tobacco Cessation, relapse prevention, and pharmacological support that can be provided by physician.;OAdvice worker assist with locating and accessing local/national Quit Smoking  programs, and support quit date choice.    Expected Outcomes  Short Term: Will demonstrate readiness to quit, by selecting a quit date.;Short Term: Will quit all tobacco product use, adhering to prevention of relapse plan.;Long Term: Complete abstinence from all tobacco products for at least 12 months from quit date.    Improve shortness of breath with ADL's  Yes    Intervention  Provide education, individualized exercise plan and daily activity instruction to help decrease symptoms of SOB with activities of daily living.    Expected Outcomes  Short Term: Improve cardiorespiratory fitness to achieve a reduction of symptoms when performing ADLs;Long Term: Be able to perform more ADLs without symptoms or delay the onset of symptoms    Heart Failure  Yes    Intervention  Provide a combined exercise and nutrition program that is supplemented with education, support and counseling about heart failure. Directed toward relieving symptoms such as shortness of breath, decreased exercise tolerance, and extremity edema.    Expected Outcomes  Improve functional capacity of life;Short term: Attendance in program 2-3 days a week  with increased exercise capacity. Reported lower sodium intake. Reported increased fruit and vegetable intake. Reports medication compliance.;Short term: Daily weights obtained and reported for increase. Utilizing diuretic protocols set by physician.;Long term: Adoption of self-care skills and reduction of barriers for early signs and symptoms recognition and intervention leading to self-care maintenance.       Core Components/Risk Factors/Patient Goals Review:  Goals and Risk Factor Review    Row Name 08/12/18 2240 08/12/18 2241           Core Components/Risk Factors/Patient Goals Review   Personal Goals Review  Tobacco Cessation;Weight Management/Obesity;Improve shortness of breath with ADL's;Increase knowledge of respiratory medications and ability to use respiratory devices  properly.;Heart Failure  -      Review  -  Pt has completed 4 exercise session.  Pt with 1.6 kg. Unable to assess adequately pt goals.  Pt is presently hospitalized for CHF and is receiving physical therapyl.  As pt nears discharge will be able to determine if home healthy PT should be completed first before retutrning to pulmonary rehab.      Expected Outcomes  -  See Admission  Goals/Outcomes         Core Components/Risk Factors/Patient Goals at Discharge (Final Review):  Goals and Risk Factor Review - 08/12/18 2241      Core Components/Risk Factors/Patient Goals Review   Review  Pt has completed 4 exercise session.  Pt with 1.6 kg. Unable to assess adequately pt goals.  Pt is presently hospitalized for CHF and is receiving physical therapyl.  As pt nears discharge will be able to determine if home healthy PT should be completed first before retutrning to pulmonary rehab.    Expected Outcomes  See Admission  Goals/Outcomes       ITP Comments: ITP Comments    Row Name 07/07/18 1430 08/12/18 2247         ITP Comments  Dr. Manfred Arch, Medical Director  Dr. Manfred Arch, Medical Director Pulmonary Rehab         Comments:Pt completed 4 exercise sessions.  Pt is presently on hold pending discharge and immediate plans for continued physical therapy. Cherre Huger, BSN Cardiac and Training and development officer

## 2018-08-12 NOTE — Progress Notes (Signed)
Nutrition Follow-up  DOCUMENTATION CODES:   Obesity unspecified  INTERVENTION:    Magic cup BID (orange) with meals, each supplement provides 290 kcal and 9 grams of protein   NUTRITION DIAGNOSIS:   Inadequate oral intake related to inability to eat as evidenced by NPO status.  Resolved   GOAL:   Patient will meet greater than or equal to 90% of their needs  Meeting  MONITOR:   PO intake  REASON FOR ASSESSMENT:   Ventilator, Consult Enteral/tube feeding initiation and management  ASSESSMENT:   67 yo female with PMH of NICM (EF 15%), HTN, HLD, morbid obesity, CHF, CAD, CKD, and CVA who was admitted on 9/12 with biventricular HF, hypoxic respiratory failure. Developed A fib, transferred to MICU and intubated 9/13.   9/14- extubated, diet advanced to heart healthy  Pt endorses having a great appetite since last RD visit. Meal completions charted as 75-100% for her last 5 meals. She tolerated 100% of eggs, potatoes, and french toast for breakfast this morning. Denies any issue with swallowing but complains of "scratchiness" likely from prior intubation. Pt declines Ensure or Boost at this time but is willing to try magic cup.   Weight noted to decrease from 213 lb 9/16 to 211 lb today. Will continue to follow trends.   I/O: -18 L since admit- continues to diurese UOP: 3700 ml x 24 hrs  Medications reviewed and include: 60 mg lasix BID, Mag-Ox, amiodarone Labs reviewed: corrected calcium 9.7 (wdl)  Diet Order:   Diet Order            Diet Heart Room service appropriate? Yes; Fluid consistency: Thin  Diet effective now              EDUCATION NEEDS:   No education needs have been identified at this time  Skin:  Skin Assessment: Reviewed RN Assessment  Last BM:  08/11/18  Height:   Ht Readings from Last 1 Encounters:  08/06/18 5\' 6"  (1.676 m)    Weight:   Wt Readings from Last 1 Encounters:  08/12/18 95.9 kg    Ideal Body Weight:  59.1 kg  BMI:   Body mass index is 34.12 kg/m.  Estimated Nutritional Needs:   Kcal:  1650-1850  Protein:  95-115 gm  Fluid:  >/= 1.8 L  Mariana Single RD, LDN Clinical Nutrition Pager # [phone]

## 2018-08-12 NOTE — Care Management Important Message (Signed)
Important Message  Patient Details  Name: Patricia Wagner MRN: [id number] Date of Birth: Aug 14, 1951   Medicare Important Message Given:  Yes    Orbie Pyo 08/12/2018, 2:52 PM

## 2018-08-12 NOTE — Progress Notes (Signed)
Dr [name] came to bedside around 20:15. Pt remains in afib with rate in 120s. Order for 150mg  amio bolus and increase the rate to 60mg  through the night. Will continue to monitor.

## 2018-08-13 ENCOUNTER — Inpatient Hospital Stay: Admission: RE | Admit: 2018-08-13 | Payer: Medicare HMO | Source: Ambulatory Visit

## 2018-08-13 ENCOUNTER — Encounter (HOSPITAL_COMMUNITY): Payer: Medicare HMO

## 2018-08-13 LAB — CBC WITH DIFFERENTIAL/PLATELET
Basophils Absolute: 0.1 10*3/uL (ref 0.0–0.1)
Basophils Relative: 1 %
EOS PCT: 3 %
Eosinophils Absolute: 0.2 10*3/uL (ref 0.0–0.7)
HCT: 41.5 % (ref 36.0–46.0)
HEMOGLOBIN: 11.5 g/dL — AB (ref 12.0–15.0)
LYMPHS ABS: 0.7 10*3/uL (ref 0.7–4.0)
LYMPHS PCT: 11 %
MCH: 23.2 pg — AB (ref 26.0–34.0)
MCHC: 27.7 g/dL — ABNORMAL LOW (ref 30.0–36.0)
MCV: 83.7 fL (ref 78.0–100.0)
MONO ABS: 0.8 10*3/uL (ref 0.1–1.0)
Monocytes Relative: 12 %
Neutro Abs: 4.8 10*3/uL (ref 1.7–7.7)
Neutrophils Relative %: 73 %
PLATELETS: 104 10*3/uL — AB (ref 150–400)
RBC: 4.96 MIL/uL (ref 3.87–5.11)
RDW: 23.8 % — ABNORMAL HIGH (ref 11.5–15.5)
WBC: 6.7 10*3/uL (ref 4.0–10.5)

## 2018-08-13 LAB — COMPREHENSIVE METABOLIC PANEL
ALK PHOS: 78 U/L (ref 38–126)
ALT: 20 U/L (ref 0–44)
AST: 13 U/L — ABNORMAL LOW (ref 15–41)
Albumin: 2.7 g/dL — ABNORMAL LOW (ref 3.5–5.0)
Anion gap: 10 (ref 5–15)
BUN: 34 mg/dL — ABNORMAL HIGH (ref 8–23)
CALCIUM: 8.8 mg/dL — AB (ref 8.9–10.3)
CO2: 39 mmol/L — ABNORMAL HIGH (ref 22–32)
CREATININE: 1.76 mg/dL — AB (ref 0.44–1.00)
Chloride: 92 mmol/L — ABNORMAL LOW (ref 98–111)
GFR calc Af Amer: 34 mL/min — ABNORMAL LOW (ref >60)
GFR, EST NON AFRICAN AMERICAN: 29 mL/min — AB (ref >60)
Glucose, Bld: 85 mg/dL (ref 70–99)
Potassium: 3.8 mmol/L (ref 3.5–5.1)
Sodium: 141 mmol/L (ref 135–145)
Total Bilirubin: 0.8 mg/dL (ref 0.3–1.2)
Total Protein: 6.1 g/dL — ABNORMAL LOW (ref 6.5–8.1)

## 2018-08-13 LAB — COOXEMETRY PANEL
Carboxyhemoglobin: 2.2 % — ABNORMAL HIGH (ref 0.5–1.5)
Methemoglobin: 1.6 % — ABNORMAL HIGH (ref 0.0–1.5)
O2 Saturation: 74.3 %
TOTAL HEMOGLOBIN: 11.5 g/dL — AB (ref 12.0–16.0)

## 2018-08-13 MED ORDER — RANOLAZINE ER 500 MG PO TB12
500.0000 mg | ORAL_TABLET | Freq: Two times a day (BID) | ORAL | Status: DC
  Administered 2018-08-13 – 2018-08-17 (×9): 500 mg via ORAL
  Filled 2018-08-13 (×9): qty 1

## 2018-08-13 MED ORDER — ACETAMINOPHEN 325 MG PO TABS: 650.0000 mg | ORAL_TABLET | Freq: Four times a day (QID) | ORAL | Status: DC | PRN

## 2018-08-13 NOTE — Progress Notes (Signed)
PT Cancellation Note  Patient Details Name: Patricia Wagner MRN: [id number] DOB: Jun 07, 1951   Cancelled Treatment:    Reason Eval/Treat Not Completed: Fatigue/lethargy limiting ability to participate. Pt declining despite max encouragement and education that PT treatment session unlikely over weekend. Will follow-up as schedule permits.  Mabeline Caras, PT, DPT Acute Rehabilitation Services  Pager [phone] Office [place] 08/13/2018, 11:16 AM

## 2018-08-13 NOTE — Progress Notes (Signed)
Patricia Wagner  OYD:[id number] DOB: 11/12/1951 DOA: 08/05/2018 PCP: Biagio Borg, MD    Brief Narrative:  67 y/o female with a history of CVA, HTN, HLD, Diverticulosis w/ GIB, CKD stage IV, PAF on eliquis, severe RV dysfxn, and systolic heart failure (EF 15%) who developed acute respiratory failure with hypoxemia due to a heart failure exacerbation.   Significant Events: 9/12 admit  9/13 intubated 9/15 extubated  9/16 [facility] assumed care   Subjective: Overall feels better.  Would like to go home soon.  Denies dyspnea.  Leg swelling present but improving on IV Lasix.  Lives at home alone, ambulates with the help of a cane, ongoing tobacco abuse, no home oxygen use, has chronic indwelling Foley catheter which was changed this admission and follows with outpatient urology for same.  Assessment & Plan:  Acute hypoxic respiratory failure  Secondary to decompensated CHF.  Extubated 9/15.  Saturating in the mid 90s on 2 L/min oxygen.  Wean oxygen as tolerated.  Reassess home oxygen needs prior to discharge.  Facial droop - slurred speech Exact etiology not clear - resolved - CT head and MRI w/o acute findings - ?recurdescence of prior stroke deficits in setting of decreased CO due to acute CHF exac.  Resolved without recurrence.  Acute on chronic systolic and diastolic CHF due to NICM Management per advanced HF, input appreciated.  Suspecting A. fib rate related cardiomyopathy.  TTE 03/06/2018: LVEF 40-45%.  Bedside echo by cardiology 9/12: LVEF 40-45%.  Amyloid work-up negative with negative fat pad biopsy and PYP scan.  On aggressive IV Lasix diuresis and -21.5 L & 20 pounds down since admission.  Cardiology giving additional IV Lasix 60 mg today and then likely transition to oral.  Imdur, hydralazine and Coreg were on hold due to soft blood pressures.  No Aldactone or Entresto due to CKD.  Persistent Atrial fibrillation Management per cardiology.  Cannot tolerate A. fib with recurrent  cardiogenic shock when A. fib recurs.  Has had multiple DCCV's in the past.  Currently on IV amiodarone drip.  Mostly sinus rhythm but had brief periods of A. fib overnight.  Per cardiology, continue amiodarone for another day and then consider transitioning to oral.  Ranexa resumed.  Continue Eliquis 5 mg twice daily.  EP follows outpatient and holding off on ablation for now because she needs lumpectomy.CHA2DS2-VASc score is 5  Acute kidney injury on CKD Stage IV Baseline crt appears to have increased in last month to ~2.0-2.4 - crt appears to be slowly improving - cont to follow - avoid potential nephrotoxins.  Creatinine down to 1.7.  Baseline apparently in the 1.6-1.8 range.  Strep pneumo tracheobronchitis  Trach asp cx +.  Completed course of IV ceftriaxone today.  Mild transaminitis likely due to passive congestion in setting of RV failure - follow intermittently.  Resolved.  Hypokalemia/hypomagnesemia  replaced.  Suspected sleep apnea Refused sleep study in the past.  Reconsider evaluation as outpatient.  L breast papilloma To have lumpectomy per Dr. Brantley Stage when stable medically   Essential hypertension Controlled.  Normocytic anemia:  Stable.  Thrombocytopenia:  Stable.   DVT prophylaxis: Eliquis Code Status: Full Family Communication: None at bedside Disposition Plan: DC home pending clinical improvement  Consultants:  CHF Team  PCCM  Antimicrobials:  Unasyn 9/13  Rocephin 9/16 >9/20  Objective:  Vitals:   08/13/18 0323 08/13/18 0704 08/13/18 0907 08/13/18 1204  BP: 124/66  116/76 120/74  Pulse: (!) 54  (!) 56 90  Resp: (!) 22   20  Temp: 98.9 F (37.2 C)  98.7 F (37.1 C) 97.9 F (36.6 C)  TempSrc: Oral  Oral Oral  SpO2: 97%  96% 94%  Weight:  98.1 kg    Height:         Intake/Output Summary (Last 24 hours) at 08/13/2018 1733 Last data filed at 08/13/2018 1404 Gross per 24 hour  Intake 723 ml  Output 3250 ml  Net -2527 ml   Filed Weights     08/11/18 0500 08/12/18 0500 08/13/18 0704  Weight: 95 kg 95.9 kg 98.1 kg    Examination: General: Pleasant middle-aged female, moderately built and nourished, sitting up comfortably at edge of bed. Lungs: Few bibasilar crackles.  Otherwise clear to auscultation.  No increased work of breathing. Cardiovascular: S1 and S2 heard, RRR.  No JVD or murmurs.  1+ pitting bilateral leg edema.  Telemetry personally reviewed: Sinus rhythm.  Had approximately 30 minutes of A. fib with RVR overnight. Abdomen: Nondistended, soft and nontender.  Normal bowel sounds heard.  No organomegaly or masses appreciated. Extremities: Moves all limbs symmetrically and well.  CBC: Recent Labs  Lab 08/11/18 0501 08/12/18 0510 08/13/18 0330  WBC 7.5 6.6 6.7  NEUTROABS  --  4.8 4.8  HGB 11.4* 11.3* 11.5*  HCT 40.8 40.7 41.5  MCV 83.1 83.9 83.7  PLT 104* 108* 341*   Basic Metabolic Panel: Recent Labs  Lab 08/07/18 0358  08/11/18 0501 08/12/18 0510 08/13/18 0330  NA 144   < > 146* 143 141  K 2.7*   < > 3.7 3.8 3.8  CL 101   < > 94* 94* 92*  CO2 34*   < > 39* 41* 39*  GLUCOSE 128*   < > 87 86 85  BUN 60*   < > 37* 36* 34*  CREATININE 3.03*   < > 2.03* 1.73* 1.76*  CALCIUM 8.8*   < > 8.8* 8.6* 8.8*  MG 2.6*  --  1.9 1.8  --   PHOS 3.5  --   --   --   --    < > = values in this interval not displayed.   GFR: Estimated Creatinine Clearance: 37.6 mL/min (A) (by C-G formula based on SCr of 1.76 mg/dL (H)).  Liver Function Tests: Recent Labs  Lab 08/11/18 0501 08/12/18 0510 08/13/18 0330  AST 9* 10* 13*  ALT 24 21 20   ALKPHOS 81 75 78  BILITOT 1.2 0.9 0.8  PROT 6.0* 5.7* 6.1*  ALBUMIN 2.6* 2.6* 2.7*    Cardiac Enzymes: Recent Labs  Lab 08/07/18 0358 08/10/18 0016  CKTOTAL 17*  --   TROPONINI  --  0.06*    HbA1C: Hgb A1c MFr Bld  Date/Time Value Ref Range Status  08/06/2018 06:14 AM 6.4 (H) 4.8 - 5.6 % Final    Comment:    (NOTE) Pre diabetes:          5.7%-6.4% Diabetes:               >6.4% Glycemic control for   <7.0% adults with diabetes   11/21/2016 05:41 AM 6.3 (H) 4.8 - 5.6 % Final    Comment:    (NOTE)         Pre-diabetes: 5.7 - 6.4         Diabetes: >6.4         Glycemic control for adults with diabetes: <7.0     CBG: Recent Labs  Lab 08/08/18 2359 08/09/18 0343  08/09/18 0802 08/09/18 1136 08/09/18 1528  GLUCAP 142* 98 86 78 117*    Recent Results (from the past 240 hour(s))  MRSA PCR Screening     Status: None   Collection Time: 08/06/18  5:26 AM  Result Value Ref Range Status   MRSA by PCR NEGATIVE NEGATIVE Final    Comment:        The GeneXpert MRSA Assay (FDA approved for NASAL specimens only), is one component of a comprehensive MRSA colonization surveillance program. It is not intended to diagnose MRSA infection nor to guide or monitor treatment for MRSA infections. Performed at [facility], [place] [address], [place], [place] [address]   Culture, respiratory (non-expectorated)     Status: None   Collection Time: 08/06/18 12:23 PM  Result Value Ref Range Status   Specimen Description TRACHEAL ASPIRATE  Final   Special Requests NONE  Final   Gram Stain   Final    RARE SQUAMOUS EPITHELIAL CELLS PRESENT FEW WBC PRESENT, PREDOMINANTLY PMN FEW GRAM POSITIVE RODS RARE GRAM POSITIVE COCCI Performed at [facility], [place] [address], [place], [place] [address]    Culture MODERATE STREPTOCOCCUS PNEUMONIAE  Final   Report Status 08/09/2018 FINAL  Final   Organism ID, Bacteria STREPTOCOCCUS PNEUMONIAE  Final      Susceptibility   Streptococcus pneumoniae - MIC*    ERYTHROMYCIN 2 RESISTANT Resistant     LEVOFLOXACIN 1 SENSITIVE Sensitive     PENICILLIN (meningitis) 1 RESISTANT Resistant     PENICILLIN (non-meningitis) 1 SENSITIVE Sensitive     CEFTRIAXONE (non-meningitis) 0.5 SENSITIVE Sensitive     CEFTRIAXONE (meningitis) 0.5 SENSITIVE Sensitive     * MODERATE STREPTOCOCCUS PNEUMONIAE     Scheduled Meds: .  apixaban  5 mg Oral BID  . magnesium oxide  400 mg Oral BID  . ranolazine  500 mg Oral BID  . sodium chloride flush  3 mL Intravenous Q12H   Continuous Infusions: . sodium chloride 250 mL (08/13/18 1647)  . amiodarone 30 mg/hr (08/13/18 1519)     LOS: 8 days   Vernell Leep, MD, FACP, [facility]. [facility] Pager [phone]  If 7PM-7AM, please contact night-coverage [email or web address] Password [facility] 08/13/2018, 5:49 PM

## 2018-08-13 NOTE — Progress Notes (Addendum)
Advanced Heart Failure Rounding Note  PCP-Cardiologist: Glori Bickers, MD   Subjective:    Extubated 08/07/18. Was able to ambulated with PT.   Coox 74.3% this am. Weight shows up 5 lbs overnight, but moved floors. Negative 1.6L. Remains down 20 lbs total.   Cr 2.25 -> 2.21 -> 2.03 -> 1.73 -> 1.76. CVP 9-10 cm. K 3.8. Mg 1.8.   Remains in NSR this am. Was in Afib from ~1900-2000, and again for an hour around midnight. Remains on amio gtt.   Feeling OK this am. Denies CP, palpitations, lightheadedness or dizziness. Wants coffee. Denies SOB. Co-ox 74%  Bedside echo completed by Dr [name] 08/06/18 which showed EF 40-45%, but RV severely reduced.   Objective:   Weight Range: 98.1 kg Body mass index is 34.9 kg/m.   Vital Signs:   Temp:  [98.2 F (36.8 C)-99.2 F (37.3 C)] 98.9 F (37.2 C) (09/20 0323) Pulse Rate:  [54-69] 54 (09/20 0323) Resp:  [16-22] 22 (09/20 0323) BP: (96-130)/(64-86) 124/66 (09/20 0323) SpO2:  [66 %-100 %] 97 % (09/20 0323) Weight:  [98.1 kg] 98.1 kg (09/20 0704) Last BM Date: 08/12/18  Weight change: Filed Weights   08/11/18 0500 08/12/18 0500 08/13/18 0704  Weight: 95 kg 95.9 kg 98.1 kg   Intake/Output:  Intake/Output Summary (Last 24 hours) at 08/13/2018 0817 Last data filed at 08/13/2018 0707 Gross per 24 hour  Intake 611.07 ml  Output 2800 ml  Net -2188.93 ml    Physical Exam    General: NAD NAD HEENT: Normal anicteric  Neck: Supple. JVP ~8-9 cm. Carotids 2+ bilat; no bruits. No thyromegaly or nodule noted. Cor: PMI nondisplaced. RRR, +RV lift, 2/6 TR.  Lungs: CTAB, normal effort. wheeze Abdomen: Soft, non-tender, non-distended, no HSM. No bruits or masses. +BS  Extremities: no cyanosis, clubbing, rash, edema Neuro: alert & oriented x 3, cranial nerves grossly intact. moves all 4 extremities w/o difficulty. Affect pleasant   Telemetry   Afib yesterday x 2 separate hours (1900-2000 and ~ 1130-0030). Otherwise has been NSR/SB  50-60s, personally reviewed.   EKG    No new tracings.    Labs    CBC Recent Labs    08/12/18 0510 08/13/18 0330  WBC 6.6 6.7  NEUTROABS 4.8 4.8  HGB 11.3* 11.5*  HCT 40.7 41.5  MCV 83.9 83.7  PLT 108* 163*   Basic Metabolic Panel Recent Labs    08/11/18 0501 08/12/18 0510 08/13/18 0330  NA 146* 143 141  K 3.7 3.8 3.8  CL 94* 94* 92*  CO2 39* 41* 39*  GLUCOSE 87 86 85  BUN 37* 36* 34*  CREATININE 2.03* 1.73* 1.76*  CALCIUM 8.8* 8.6* 8.8*  MG 1.9 1.8  --    Liver Function Tests Recent Labs    08/12/18 0510 08/13/18 0330  AST 10* 13*  ALT 21 20  ALKPHOS 75 78  BILITOT 0.9 0.8  PROT 5.7* 6.1*  ALBUMIN 2.6* 2.7*   No results for input(s): LIPASE, AMYLASE in the last 72 hours. Cardiac Enzymes No results for input(s): CKTOTAL, CKMB, CKMBINDEX, TROPONINI in the last 72 hours. BNP: BNP (last 3 results) Recent Labs    08/05/18 2155  BNP 3,951.4*    ProBNP (last 3 results) No results for input(s): PROBNP in the last 8760 hours.   D-Dimer No results for input(s): DDIMER in the last 72 hours. Hemoglobin A1C No results for input(s): HGBA1C in the last 72 hours. Fasting Lipid Panel No results for input(s): CHOL,  HDL, LDLCALC, TRIG, CHOLHDL, LDLDIRECT in the last 72 hours. Thyroid Function Tests No results for input(s): TSH, T4TOTAL, T3FREE, THYROIDAB in the last 72 hours.  Invalid input(s): FREET3  Other results:  Imaging   No results found.  Medications:    Scheduled Medications: . apixaban  5 mg Oral BID  . furosemide  60 mg Intravenous BID  . magnesium oxide  400 mg Oral BID  . sodium chloride flush  3 mL Intravenous Q12H    Infusions: . sodium chloride    . amiodarone 30 mg/hr (08/13/18 0240)  . cefTRIAXone (ROCEPHIN)  IV 2 g (08/12/18 1531)    PRN Medications: sodium chloride, fentaNYL (SUBLIMAZE) injection, sodium chloride flush  Patient Profile   Patricia Wagner is a 67 y.o. female with a past medical history of  paroxysmalatrial fibrillation, NICM cardiomyopathy, Biventricular CHF (LVEF 15% and RV severely reduced by TEE 03/25/17), HTN, hx of recurrent GIB, COPD, anemia, chronic foley catheter, and HLD.   Admitted with facial droop/slurred speech and hypoxia. Found to be in A/C hypoxic respiratory failure and biventricular HF.   Assessment/Plan   1. A/C hypoxic respiratory failure in COPD - Intubated 9/13, extubated 08/07/18.  - Stable on RA - Weight down 20 lbs total with IV diuresis.   2. Facial droop/slurred speech - Head CT negative - MRI showed no acute changes.  - Urine culture with rare bacteria + casts.  - Alert and oriented today. No change.   3. Acute on chronic biventricular CHF due to NICM (suspect AF cardiomyopathy)  - TEE 03/25/17 with LVEF 15% and severely reduced RV function.  - Echo 06/2017 EF 30-35% (up from 15% in setting of AF in 5/18).  - Echo 02/2018: EF 40-45%, grade 1 DD, RV normal - Amyloid w/u negative with negative fat pad biopsy and PYP scan  - Volume status remains elevated. Coox 74%. CVP 9-10 this am.  - Will give 60 mg IV lasix this am and then likely transition to po.  - Bedside echo completed by Dr [name] 9/12, which showed EF 40-45%, but RV severely reduced. Low suspicion for PE since she is on chronic AC.  - Imdur, hydral, and coreg on hold. SBP improved 100-110s -Off spiro and Entresto with CKD  4. Persistent Afib: Cannot tolerate AF with recurrent shock. S/p DCCV on 5/2 (she stopped Amio after this) and DCCV on 05/06/17 and again 05/12/17. - She absolutely cannot tolerate AF with recurrent cardiogenic shock when AF recurs. - Remains in NSR today. Several breakthrough episodes yesterday as above.  - Coox stable 74%.  - Continue amiodarone drip for today. Will discuss timing with MD.  - Ranexa on hold with AKI.  - Continue Eliquis 5 mg BID - EP follows outpatient. Holding off on ablation for now because she needs lumpectomy.  5. Suspected Sleep  Apnea: - Refuses sleep study. No change.   6. AKI on CKD -  Baseline 1.6-1.8 - Cr trending down. 3.89 -> 3.03 -> 2.65 -> 2.25 -> 2.21 -> 2.03 -> 1.73 -> 1.76 which is her baseline.   7. HTN - Stable Stable. SBP 100-110s  8. Anemia - Hgb 11.5. Stable.  9. Hypomagnesiumia - Mag 1.8 08/12/18. Supped.   Remains in NSR. Volume improved. ? Accuracy of weights with floor change.   Medication concerns reviewed with patient and pharmacy team. Barriers identified: None at this time.   Length of Stay: [place], [place]  08/13/2018, 8:17 AM  Advanced Heart Failure  Team Pager [phone] (M-F; [place])  Please contact [facility] for night-coverage after hours (4p -7a ) and weekends on [email or web address]  Patient seen and examined with the above-signed Advanced Practice Provider and/or Housestaff. I personally reviewed laboratory data, imaging studies and relevant notes. I independently examined the patient and formulated the important aspects of the plan. I have edited the note to reflect any of my changes or salient points. I have personally discussed the plan with the patient and/or family.  She continues to improve. Mostly in NSR with some breakthrough. Continue IV amio one more day and consider switch to po tomorrow. Volume status mildly elevated. Continue IV lasix one more day. Creatinine back down to baseline 1.7-1.8. Would resume Ranexa. Continue Eliquis. Resume hydralazine/imdur soon.   Glori Bickers, MD  12:41 PM

## 2018-08-14 DIAGNOSIS — N170 Acute kidney failure with tubular necrosis: Secondary | ICD-10-CM

## 2018-08-14 DIAGNOSIS — I480 Paroxysmal atrial fibrillation: Secondary | ICD-10-CM

## 2018-08-14 DIAGNOSIS — I10 Essential (primary) hypertension: Secondary | ICD-10-CM

## 2018-08-14 LAB — COMPREHENSIVE METABOLIC PANEL
ALBUMIN: 2.6 g/dL — AB (ref 3.5–5.0)
ALK PHOS: 70 U/L (ref 38–126)
ALT: 17 U/L (ref 0–44)
AST: 12 U/L — ABNORMAL LOW (ref 15–41)
Anion gap: 9 (ref 5–15)
BUN: 32 mg/dL — AB (ref 8–23)
CALCIUM: 8.9 mg/dL (ref 8.9–10.3)
CO2: 40 mmol/L — AB (ref 22–32)
CREATININE: 1.69 mg/dL — AB (ref 0.44–1.00)
Chloride: 92 mmol/L — ABNORMAL LOW (ref 98–111)
GFR calc non Af Amer: 31 mL/min — ABNORMAL LOW (ref >60)
GFR, EST AFRICAN AMERICAN: 36 mL/min — AB (ref >60)
GLUCOSE: 92 mg/dL (ref 70–99)
Potassium: 3.6 mmol/L (ref 3.5–5.1)
SODIUM: 141 mmol/L (ref 135–145)
Total Bilirubin: 1 mg/dL (ref 0.3–1.2)
Total Protein: 6.1 g/dL — ABNORMAL LOW (ref 6.5–8.1)

## 2018-08-14 LAB — CBC WITH DIFFERENTIAL/PLATELET
BASOS ABS: 0.1 10*3/uL (ref 0.0–0.1)
Basophils Relative: 1 %
EOS PCT: 1 %
Eosinophils Absolute: 0.1 10*3/uL (ref 0.0–0.7)
HEMATOCRIT: 40.3 % (ref 36.0–46.0)
HEMOGLOBIN: 11.2 g/dL — AB (ref 12.0–15.0)
LYMPHS PCT: 6 %
Lymphs Abs: 0.5 10*3/uL — ABNORMAL LOW (ref 0.7–4.0)
MCH: 23.4 pg — ABNORMAL LOW (ref 26.0–34.0)
MCHC: 27.8 g/dL — ABNORMAL LOW (ref 30.0–36.0)
MCV: 84.3 fL (ref 78.0–100.0)
MONOS PCT: 12 %
Monocytes Absolute: 1 10*3/uL (ref 0.1–1.0)
NEUTROS PCT: 80 %
Neutro Abs: 6.6 10*3/uL (ref 1.7–7.7)
Platelets: 108 10*3/uL — ABNORMAL LOW (ref 150–400)
RBC: 4.78 MIL/uL (ref 3.87–5.11)
RDW: 23.5 % — ABNORMAL HIGH (ref 11.5–15.5)
WBC Morphology: INCREASED
WBC: 8.3 10*3/uL (ref 4.0–10.5)

## 2018-08-14 LAB — URIC ACID: Uric Acid, Serum: 6.7 mg/dL (ref 2.5–7.1)

## 2018-08-14 LAB — COOXEMETRY PANEL
Carboxyhemoglobin: 2.6 % — ABNORMAL HIGH (ref 0.5–1.5)
Methemoglobin: 1.7 % — ABNORMAL HIGH (ref 0.0–1.5)
O2 Saturation: 85.2 %
TOTAL HEMOGLOBIN: 11.5 g/dL — AB (ref 12.0–16.0)

## 2018-08-14 MED ORDER — AMIODARONE HCL IN DEXTROSE 360-4.14 MG/200ML-% IV SOLN
30.0000 mg/h | INTRAVENOUS | Status: AC
  Administered 2018-08-14: 30 mg/h via INTRAVENOUS
  Filled 2018-08-14: qty 200

## 2018-08-14 MED ORDER — LOPERAMIDE HCL 2 MG PO CAPS
2.0000 mg | ORAL_CAPSULE | ORAL | Status: DC | PRN
  Administered 2018-08-14 – 2018-08-17 (×5): 2 mg via ORAL
  Filled 2018-08-14 (×5): qty 1

## 2018-08-14 MED ORDER — AMIODARONE HCL IN DEXTROSE 360-4.14 MG/200ML-% IV SOLN
30.0000 mg/h | INTRAVENOUS | Status: DC
  Administered 2018-08-15 – 2018-08-16 (×3): 30 mg/h via INTRAVENOUS
  Filled 2018-08-14 (×3): qty 200

## 2018-08-14 MED ORDER — ISOSORBIDE MONONITRATE ER 30 MG PO TB24
15.0000 mg | ORAL_TABLET | Freq: Every day | ORAL | Status: DC
  Administered 2018-08-14 – 2018-08-17 (×4): 15 mg via ORAL
  Filled 2018-08-14 (×4): qty 1

## 2018-08-14 MED ORDER — AMIODARONE HCL 200 MG PO TABS: 400.0000 mg | ORAL_TABLET | Freq: Two times a day (BID) | ORAL | Status: DC

## 2018-08-14 MED ORDER — RANOLAZINE ER 500 MG PO TB12: 500.0000 mg | ORAL_TABLET | Freq: Two times a day (BID) | ORAL | Status: DC

## 2018-08-14 MED ORDER — FUROSEMIDE 40 MG PO TABS
40.0000 mg | ORAL_TABLET | Freq: Every day | ORAL | Status: DC
  Administered 2018-08-15 – 2018-08-16 (×2): 40 mg via ORAL
  Filled 2018-08-14 (×2): qty 1

## 2018-08-14 MED ORDER — HYDRALAZINE HCL 25 MG PO TABS
12.5000 mg | ORAL_TABLET | Freq: Four times a day (QID) | ORAL | Status: DC
  Administered 2018-08-14 – 2018-08-15 (×4): 12.5 mg via ORAL
  Filled 2018-08-14 (×5): qty 1

## 2018-08-14 MED ORDER — CHLORHEXIDINE GLUCONATE CLOTH 2 % EX PADS
6.0000 | MEDICATED_PAD | Freq: Every day | CUTANEOUS | Status: DC
  Administered 2018-08-14 – 2018-08-15 (×2): 6 via TOPICAL

## 2018-08-14 NOTE — Progress Notes (Signed)
Patricia Wagner  NTI:[id number] DOB: Apr 16, 1951 DOA: 08/05/2018 PCP: Biagio Borg, MD    Brief Narrative:  67 y/o female with a history of CVA, HTN, HLD, Diverticulosis w/ GIB, CKD stage IV, PAF on eliquis, severe RV dysfxn, and systolic heart failure (EF 15%) who developed acute respiratory failure with hypoxemia due to a heart failure exacerbation.   Significant Events: 9/12 admit  9/13 intubated 9/15 extubated  9/16 [facility] assumed care   Subjective: Patient interviewed and examined along with RN this morning.  Denies complaints.  No dyspnea or chest pain reported.  Subsequently at around 4 PM, RN reported that patient had 3 episodes of runny stools and was requesting Imodium.  Assessment & Plan:  Acute hypoxic respiratory failure  Secondary to decompensated CHF.  Extubated 9/15.  Saturating in the mid 90s on 2 L/min oxygen.  Wean oxygen as tolerated.  Reassess home oxygen needs prior to discharge.  Facial droop - slurred speech Exact etiology not clear - resolved - CT head and MRI w/o acute findings - ?recurdescence of prior stroke deficits in setting of decreased CO due to acute CHF exac.  Resolved without recurrence.  Acute on chronic systolic and diastolic CHF due to NICM Management per advanced HF, input appreciated.  Suspecting A. fib rate related cardiomyopathy.  TTE 03/06/2018: LVEF 40-45%.  Bedside echo by cardiology 9/12: LVEF 40-45%.  Amyloid work-up negative with negative fat pad biopsy and PYP scan.  On aggressive IV Lasix diuresis and -21.5 L & 20 pounds down since admission.  Cardiology giving additional IV Lasix 60 mg today and then likely transition to oral.  Imdur, hydralazine and Coreg were on hold due to soft blood pressures.  No Aldactone or Entresto due to CKD.  As per cardiology follow-up, volume status looks good and can switch to oral Lasix.  Resuming low-dose hydralazine and Imdur.  Persistent Atrial fibrillation Management per cardiology.  Cannot tolerate A. fib  with recurrent cardiogenic shock when A. fib recurs.  Has had multiple DCCV's in the past.  Currently on IV amiodarone drip.  Mostly sinus rhythm but had brief periods of A. fib overnight.  Per cardiology, continue amiodarone for another day and then consider transitioning to oral.  Ranexa resumed.  Continue Eliquis 5 mg twice daily.  EP follows outpatient and holding off on ablation for now because she needs lumpectomy.CHA2DS2-VASc score is 5.  Patient has intermittent ongoing breakthrough AF and cardiology plans to continue IV amiodarone at this time.  Acute kidney injury on CKD Stage IV Baseline crt appears to have increased in last month to ~2.0-2.4 - crt appears to be slowly improving - cont to follow - avoid potential nephrotoxins.  Creatinine down to 1. 69.  Baseline apparently in the 1.6-1.8 range.  Strep pneumo tracheobronchitis  Trach asp cx +.  Completed course of IV ceftriaxone today.  Mild transaminitis likely due to passive congestion in setting of RV failure - follow intermittently.  Resolved.  Hypokalemia/hypomagnesemia  replaced.  Suspected sleep apnea Refused sleep study in the past.  Reconsider evaluation as outpatient.  L breast papilloma To have lumpectomy per Dr. Brantley Stage when stable medically   Essential hypertension Controlled.  Normocytic anemia:  Stable.  Thrombocytopenia:  Stable.  Diarrhea Likely due to magnesium.  Magnesium stop.  PRN Imodium.  Low index of suspicion for C. difficile.   DVT prophylaxis: Eliquis Code Status: Full Family Communication: None at bedside Disposition Plan: DC home pending clinical improvement  Consultants:  CHF Team  PCCM  Antimicrobials:  Unasyn 9/13  Rocephin 9/16 >9/20  Objective:  Vitals:   08/14/18 0012 08/14/18 0451 08/14/18 0803 08/14/18 1200  BP: 104/63  (!) 132/93   Pulse:   60 (!) 56  Resp:   20 16  Temp: 99.1 F (37.3 C) 98.8 F (37.1 C) 98.4 F (36.9 C)   TempSrc: Axillary Oral Oral   SpO2:    97% 98%  Weight:  98 kg    Height:         Intake/Output Summary (Last 24 hours) at 08/14/2018 1744 Last data filed at 08/14/2018 1300 Gross per 24 hour  Intake 1278.21 ml  Output 1175 ml  Net 103.21 ml   Filed Weights   08/12/18 0500 08/13/18 0704 08/14/18 0451  Weight: 95.9 kg 98.1 kg 98 kg    Examination: General: Pleasant middle-aged female, moderately built and nourished, lying comfortably propped up in bed. Lungs: Few bibasilar crackles.  Otherwise clear to auscultation.  No increased work of breathing.  Stable without change. Cardiovascular: S1 and S2 heard, RRR.  No JVD or murmurs.  1+ pitting bilateral leg edema (no change).  Telemetry personally reviewed: Mostly sinus rhythm.  However had periods of breakthrough A. fib with RVR and controlled ventricular rate over the last 24 hours. Abdomen: Nondistended, soft and nontender.  Normal bowel sounds heard.  No organomegaly or masses appreciated. Extremities: Moves all limbs symmetrically and well.  CBC: Recent Labs  Lab 08/12/18 0510 08/13/18 0330 08/14/18 0510  WBC 6.6 6.7 8.3  NEUTROABS 4.8 4.8 6.6  HGB 11.3* 11.5* 11.2*  HCT 40.7 41.5 40.3  MCV 83.9 83.7 84.3  PLT 108* 104* 458*   Basic Metabolic Panel: Recent Labs  Lab 08/11/18 0501 08/12/18 0510 08/13/18 0330 08/14/18 0510  NA 146* 143 141 141  K 3.7 3.8 3.8 3.6  CL 94* 94* 92* 92*  CO2 39* 41* 39* 40*  GLUCOSE 87 86 85 92  BUN 37* 36* 34* 32*  CREATININE 2.03* 1.73* 1.76* 1.69*  CALCIUM 8.8* 8.6* 8.8* 8.9  MG 1.9 1.8  --   --    GFR: Estimated Creatinine Clearance: 39.2 mL/min (A) (by C-G formula based on SCr of 1.69 mg/dL (H)).  Liver Function Tests: Recent Labs  Lab 08/11/18 0501 08/12/18 0510 08/13/18 0330 08/14/18 0510  AST 9* 10* 13* 12*  ALT 24 21 20 17   ALKPHOS 81 75 78 70  BILITOT 1.2 0.9 0.8 1.0  PROT 6.0* 5.7* 6.1* 6.1*  ALBUMIN 2.6* 2.6* 2.7* 2.6*    Cardiac Enzymes: Recent Labs  Lab 08/10/18 0016  TROPONINI 0.06*     HbA1C: Hgb A1c MFr Bld  Date/Time Value Ref Range Status  08/06/2018 06:14 AM 6.4 (H) 4.8 - 5.6 % Final    Comment:    (NOTE) Pre diabetes:          5.7%-6.4% Diabetes:              >6.4% Glycemic control for   <7.0% adults with diabetes   11/21/2016 05:41 AM 6.3 (H) 4.8 - 5.6 % Final    Comment:    (NOTE)         Pre-diabetes: 5.7 - 6.4         Diabetes: >6.4         Glycemic control for adults with diabetes: <7.0     CBG: Recent Labs  Lab 08/08/18 2359 08/09/18 0343 08/09/18 0802 08/09/18 1136 08/09/18 1528  GLUCAP 142* 98 86 78 117*  Recent Results (from the past 240 hour(s))  MRSA PCR Screening     Status: None   Collection Time: 08/06/18  5:26 AM  Result Value Ref Range Status   MRSA by PCR NEGATIVE NEGATIVE Final    Comment:        The GeneXpert MRSA Assay (FDA approved for NASAL specimens only), is one component of a comprehensive MRSA colonization surveillance program. It is not intended to diagnose MRSA infection nor to guide or monitor treatment for MRSA infections. Performed at [facility], [place] [address] [place], [place] [address]   Culture, respiratory (non-expectorated)     Status: None   Collection Time: 08/06/18 12:23 PM  Result Value Ref Range Status   Specimen Description TRACHEAL ASPIRATE  Final   Special Requests NONE  Final   Gram Stain   Final    RARE SQUAMOUS EPITHELIAL CELLS PRESENT FEW WBC PRESENT, PREDOMINANTLY PMN FEW GRAM POSITIVE RODS RARE GRAM POSITIVE COCCI Performed at [facility], [place] [address], [place], [place] [address]    Culture MODERATE STREPTOCOCCUS PNEUMONIAE  Final   Report Status 08/09/2018 FINAL  Final   Organism ID, Bacteria STREPTOCOCCUS PNEUMONIAE  Final      Susceptibility   Streptococcus pneumoniae - MIC*    ERYTHROMYCIN 2 RESISTANT Resistant     LEVOFLOXACIN 1 SENSITIVE Sensitive     PENICILLIN (meningitis) 1 RESISTANT Resistant     PENICILLIN (non-meningitis) 1 SENSITIVE  Sensitive     CEFTRIAXONE (non-meningitis) 0.5 SENSITIVE Sensitive     CEFTRIAXONE (meningitis) 0.5 SENSITIVE Sensitive     * MODERATE STREPTOCOCCUS PNEUMONIAE     Scheduled Meds: . apixaban  5 mg Oral BID  . Chlorhexidine Gluconate Cloth  6 each Topical Q0600  . [START ON 08/15/2018] furosemide  40 mg Oral Daily  . hydrALAZINE  12.5 mg Oral QID  . isosorbide mononitrate  15 mg Oral Daily  . ranolazine  500 mg Oral BID  . sodium chloride flush  3 mL Intravenous Q12H   Continuous Infusions: . sodium chloride 10 mL/hr at 08/14/18 1000  . amiodarone     Followed by  . amiodarone       LOS: 9 days   Vernell Leep, MD, FACP, [facility]. [facility] Pager [phone]  If 7PM-7AM, please contact night-coverage [email or web address] Password [facility] 08/14/2018, 5:44 PM

## 2018-08-14 NOTE — Progress Notes (Addendum)
Advanced Heart Failure Rounding Note  PCP-Cardiologist: Glori Bickers, MD   Subjective:    Remains on IV amio and IV lasix. In NSR. Feeling better. No orthopnea or PND. Weigh unchanged. Creatinine stable at 1.7. Co-ox 85% K 3.6  On tele had 1.5 hour breakthrough of AF from 12-130p   Having multiple loose stools. Does not look like c.diff per nurse.   Bedside echo completed by Dr [name] 08/06/18 which showed EF 40-45%, but RV severely reduced.   Objective:   Weight Range: 98 kg Body mass index is 34.86 kg/m.   Vital Signs:   Temp:  [98.4 F (36.9 C)-99.1 F (37.3 C)] 98.4 F (36.9 C) (09/21 0803) Pulse Rate:  [56-64] 56 (09/21 1200) Resp:  [16-28] 16 (09/21 1200) BP: (98-132)/(59-93) 132/93 (09/21 0803) SpO2:  [90 %-98 %] 98 % (09/21 1200) Weight:  [98 kg] 98 kg (09/21 0451) Last BM Date: 08/14/18  Weight change: Filed Weights   08/12/18 0500 08/13/18 0704 08/14/18 0451  Weight: 95.9 kg 98.1 kg 98 kg   Intake/Output:  Intake/Output Summary (Last 24 hours) at 08/14/2018 1650 Last data filed at 08/14/2018 1300 Gross per 24 hour  Intake 1518.21 ml  Output 1175 ml  Net 343.21 ml    Physical Exam    General:  Well appearing. No resp difficulty HEENT: normal Neck: supple. no JVD. RIJ TLC Carotids 2+ bilat; no bruits. No lymphadenopathy or thryomegaly appreciated. Cor: PMI nondisplaced. Regular rate & rhythm. No rubs, gallops or murmurs. Lungs: clear Abdomen: soft, nontender, nondistended. No hepatosplenomegaly. No bruits or masses. Good bowel sounds. Extremities: no cyanosis, clubbing, rash, edema Neuro: alert & orientedx3, cranial nerves grossly intact. moves all 4 extremities w/o difficulty. Affect pleasant   Telemetry   NSR/sbrady 50-60s 1.5 hour breakthrough of AF from 12-130p  Personally reviewed  EKG    No new tracings.    Labs    CBC Recent Labs    08/13/18 0330 08/14/18 0510  WBC 6.7 8.3  NEUTROABS 4.8 6.6  HGB 11.5* 11.2*  HCT 41.5  40.3  MCV 83.7 84.3  PLT 104* 517*   Basic Metabolic Panel Recent Labs    08/12/18 0510 08/13/18 0330 08/14/18 0510  NA 143 141 141  K 3.8 3.8 3.6  CL 94* 92* 92*  CO2 41* 39* 40*  GLUCOSE 86 85 92  BUN 36* 34* 32*  CREATININE 1.73* 1.76* 1.69*  CALCIUM 8.6* 8.8* 8.9  MG 1.8  --   --    Liver Function Tests Recent Labs    08/13/18 0330 08/14/18 0510  AST 13* 12*  ALT 20 17  ALKPHOS 78 70  BILITOT 0.8 1.0  PROT 6.1* 6.1*  ALBUMIN 2.7* 2.6*   No results for input(s): LIPASE, AMYLASE in the last 72 hours. Cardiac Enzymes No results for input(s): CKTOTAL, CKMB, CKMBINDEX, TROPONINI in the last 72 hours. BNP: BNP (last 3 results) Recent Labs    08/05/18 2155  BNP 3,951.4*    ProBNP (last 3 results) No results for input(s): PROBNP in the last 8760 hours.   D-Dimer No results for input(s): DDIMER in the last 72 hours. Hemoglobin A1C No results for input(s): HGBA1C in the last 72 hours. Fasting Lipid Panel No results for input(s): CHOL, HDL, LDLCALC, TRIG, CHOLHDL, LDLDIRECT in the last 72 hours. Thyroid Function Tests No results for input(s): TSH, T4TOTAL, T3FREE, THYROIDAB in the last 72 hours.  Invalid input(s): FREET3  Other results:  Imaging   No results found.  Medications:  Scheduled Medications: . apixaban  5 mg Oral BID  . Chlorhexidine Gluconate Cloth  6 each Topical Q0600  . magnesium oxide  400 mg Oral BID  . ranolazine  500 mg Oral BID  . sodium chloride flush  3 mL Intravenous Q12H    Infusions: . sodium chloride 10 mL/hr at 08/14/18 1000  . amiodarone 30 mg/hr (08/14/18 1231)    PRN Medications: sodium chloride, acetaminophen, fentaNYL (SUBLIMAZE) injection, sodium chloride flush  Patient Profile   Patricia Wagner is a 67 y.o. female with a past medical history of paroxysmalatrial fibrillation, NICM cardiomyopathy, Biventricular CHF (LVEF 15% and RV severely reduced by TEE 03/25/17), HTN, hx of recurrent GIB, COPD, anemia,  chronic foley catheter, and HLD.   Admitted with facial droop/slurred speech and hypoxia. Found to be in A/C hypoxic respiratory failure and biventricular HF.   Assessment/Plan   1. A/C hypoxic respiratory failure in COPD - Intubated 9/13, extubated 08/07/18.  - Stable on RA - Weight down 20 lbs total with IV diuresis. Now back to baseline.   2. Facial droop/slurred speech - Head CT negative - MRI showed no acute changes.  - Urine culture with rare bacteria + casts.  - Alert and oriented today. No change.   3. Acute on chronic biventricular CHF due to NICM (suspect AF cardiomyopathy)  - TEE 03/25/17 with LVEF 15% and severely reduced RV function.  - Echo 06/2017 EF 30-35% (up from 15% in setting of AF in 5/18).  - Echo 02/2018: EF 40-45%, grade 1 DD, RV normal - Amyloid w/u negative with negative fat pad biopsy and PYP scan  - Volume status looks good. Can switch to po lasix - Bedside echo 9/12, showed EF 40-45%, but RV severely reduced. Low suspicion for PE since she is on chronic AC.  - Imdur, hydral and coreg on hold. SBP improved -Resume low-dose hydral/imdur - no ACE/ARB with CKD  4. Persistent Afib: Cannot tolerate AF with recurrent shock. S/p DCCV on 5/2 (she stopped Amio after this) and DCCV on 05/06/17 and again 05/12/17. - She absolutely cannot tolerate AF with recurrent cardiogenic shock when AF recurs. - Remains in NSR today. - Coox stable 85%.  - With breakthrough AF will continue IV amio. Continue Ranexa carefully with CKD - Continue Eliquis 5 mg BID - EP follows outpatient. Holding off on ablation for now because she needs lumpectomy.  5. Suspected Sleep Apnea: - Refuses sleep study. No change.   6. AKI on CKD -  Baseline 1.6-1.8 - Cr trending down. 3.89 ->-> 1.76 ->1.69 which is her baseline.   7. HTN - BP coming back up. Resume hydral/Imdur  8. Anemia - Hgb 11.2. Stable.  9. Hypomagnesiumia - Mag 1.8 08/12/18. Supped.   10. Diarrhea/loose  stools - likely due to [place]. Will stop. - give prn imodium  I will see again Monday. Please call with questions. Should be ready for d/c soon.   Length of Stay: 9  Glori Bickers, MD  08/14/2018, 4:50 PM  Advanced Heart Failure Team Pager [phone] (M-F; 7a - 4p)  Please contact [facility] for night-coverage after hours (4p -7a ) and weekends on [email or web address]

## 2018-08-15 LAB — CBC
HEMATOCRIT: 38 % (ref 36.0–46.0)
HEMOGLOBIN: 10.5 g/dL — AB (ref 12.0–15.0)
MCH: 23.2 pg — ABNORMAL LOW (ref 26.0–34.0)
MCHC: 27.6 g/dL — ABNORMAL LOW (ref 30.0–36.0)
MCV: 83.9 fL (ref 78.0–100.0)
Platelets: 113 10*3/uL — ABNORMAL LOW (ref 150–400)
RBC: 4.53 MIL/uL (ref 3.87–5.11)
RDW: 23.5 % — ABNORMAL HIGH (ref 11.5–15.5)
WBC: 9.2 10*3/uL (ref 4.0–10.5)

## 2018-08-15 LAB — COMPREHENSIVE METABOLIC PANEL
ALT: 15 U/L (ref 0–44)
AST: 12 U/L — AB (ref 15–41)
Albumin: 2.5 g/dL — ABNORMAL LOW (ref 3.5–5.0)
Alkaline Phosphatase: 62 U/L (ref 38–126)
Anion gap: 8 (ref 5–15)
BILIRUBIN TOTAL: 1.2 mg/dL (ref 0.3–1.2)
BUN: 25 mg/dL — AB (ref 8–23)
CHLORIDE: 94 mmol/L — AB (ref 98–111)
CO2: 39 mmol/L — ABNORMAL HIGH (ref 22–32)
CREATININE: 1.64 mg/dL — AB (ref 0.44–1.00)
Calcium: 8.6 mg/dL — ABNORMAL LOW (ref 8.9–10.3)
GFR calc Af Amer: 37 mL/min — ABNORMAL LOW (ref >60)
GFR calc non Af Amer: 32 mL/min — ABNORMAL LOW (ref >60)
GLUCOSE: 84 mg/dL (ref 70–99)
Potassium: 3.1 mmol/L — ABNORMAL LOW (ref 3.5–5.1)
Sodium: 141 mmol/L (ref 135–145)
TOTAL PROTEIN: 5.5 g/dL — AB (ref 6.5–8.1)

## 2018-08-15 LAB — COOXEMETRY PANEL
Carboxyhemoglobin: 2.7 % — ABNORMAL HIGH (ref 0.5–1.5)
Methemoglobin: 1 % (ref 0.0–1.5)
O2 Saturation: 66.4 %
Total hemoglobin: 10.9 g/dL — ABNORMAL LOW (ref 12.0–16.0)

## 2018-08-15 MED ORDER — POTASSIUM CHLORIDE CRYS ER 20 MEQ PO TBCR
40.0000 meq | EXTENDED_RELEASE_TABLET | Freq: Once | ORAL | Status: AC
  Administered 2018-08-15: 40 meq via ORAL
  Filled 2018-08-15: qty 2

## 2018-08-15 NOTE — Progress Notes (Signed)
Pt was noted to be in afib for a short period of time and went back to NSR.  Idolina Primer, RN

## 2018-08-15 NOTE — Progress Notes (Signed)
Patricia Wagner  XLK:[id number] DOB: Mar 14, 1951 DOA: 08/05/2018 PCP: Biagio Borg, MD    Brief Narrative:  67 y/o female with a history of CVA, HTN, HLD, Diverticulosis w/ GIB, CKD stage IV, PAF on eliquis, severe RV dysfxn, and systolic heart failure (EF 15%) who developed acute respiratory failure with hypoxemia due to a heart failure exacerbation.   Significant Events: 9/12 admit  9/13 intubated 9/15 extubated  9/16 [facility] assumed care   Subjective: Ongoing diarrhea but seems to be better this morning.  No abdominal pain, nausea or vomiting.  No dyspnea reported.  Wants to ambulate.  Assessment & Plan:  Acute hypoxic respiratory failure  Secondary to decompensated CHF.  Extubated 9/15.  Saturating in the mid 90s on 2 L/min oxygen.  Wean oxygen as tolerated.  Reassess home oxygen needs prior to discharge.  Facial droop - slurred speech Exact etiology not clear - resolved - CT head and MRI w/o acute findings - ?recurdescence of prior stroke deficits in setting of decreased CO due to acute CHF exac.  Resolved without recurrence.  Acute on chronic systolic and diastolic CHF due to NICM Management per advanced HF, input appreciated.  Suspecting A. fib rate related cardiomyopathy.  TTE 03/06/2018: LVEF 40-45%.  Bedside echo by cardiology 9/12: LVEF 40-45%.  Amyloid work-up negative with negative fat pad biopsy and PYP scan.  On aggressive IV Lasix diuresis and -21.7 L & 20 pounds down since admission.  Imdur, hydralazine and Coreg were on hold due to soft blood pressures.  No Aldactone or Entresto due to CKD.  Cardiology restarted Lasix 40 mg orally daily, initiated low-dose hydralazine and Imdur.  Still has some volume overload.  Continue management.  Persistent Atrial fibrillation Management per cardiology.  Cannot tolerate A. fib with recurrent cardiogenic shock when A. fib recurs.  Has had multiple DCCV's in the past.  Currently on IV amiodarone drip.  Mostly sinus rhythm but had brief  periods of A. fib overnight.  Per cardiology, continue amiodarone for another day and then consider transitioning to oral.  Ranexa resumed.  Continue Eliquis 5 mg twice daily.  EP follows outpatient and holding off on ablation for now because she needs lumpectomy.CHA2DS2-VASc score is 5.  Ongoing intermittent breakthrough A. fib.  Remains on IV amiodarone drip.  Management per cardiology.  Acute kidney injury on CKD Stage IV Baseline crt appears to have increased in last month to ~2.0-2.4 - crt appears to be slowly improving - cont to follow - avoid potential nephrotoxins.  Creatinine down to 1. 64.  Baseline apparently in the 1.6-1.8 range.  Strep pneumo tracheobronchitis  Trach asp cx +.  Completed course of IV ceftriaxone.  Mild transaminitis likely due to passive congestion in setting of RV failure - follow intermittently.  Resolved.  Hypokalemia/hypomagnesemia  replace and follow.  Suspected sleep apnea Refused sleep study in the past.  Reconsider evaluation as outpatient.  L breast papilloma To have lumpectomy per Dr. Brantley Stage when stable medically   Essential hypertension Controlled.  Normocytic anemia:  Stable.  Thrombocytopenia:  Stable.  Diarrhea Likely due to magnesium.  Magnesium stopped.  PRN Imodium.  Low index of suspicion for C. difficile.  Monitor closely.   DVT prophylaxis: Eliquis Code Status: Full Family Communication: None at bedside Disposition Plan: DC home pending clinical improvement  Consultants:  CHF Team  PCCM  Antimicrobials:  Unasyn 9/13  Rocephin 9/16 >9/20  Objective:  Vitals:   08/15/18 0059 08/15/18 0342 08/15/18 0815 08/15/18 1032  BP: (!) 98/56 102/60 115/62 93/62  Pulse:   (!) 55   Resp:      Temp: 99.1 F (37.3 C) 99 F (37.2 C) 98.6 F (37 C)   TempSrc: Oral Oral Oral   SpO2:   97%   Weight:  97.3 kg    Height:         Intake/Output Summary (Last 24 hours) at 08/15/2018 1600 Last data filed at 08/15/2018 1342 Gross  per 24 hour  Intake 477.56 ml  Output 1250 ml  Net -772.44 ml   Filed Weights   08/13/18 0704 08/14/18 0451 08/15/18 0342  Weight: 98.1 kg 98 kg 97.3 kg    Examination: No significant change in clinical exam compared to yesterday. General: Pleasant middle-aged female, moderately built and nourished, lying comfortably propped up in bed. Lungs: Few bibasilar crackles.  Otherwise clear to auscultation.  No increased work of breathing.  Stable without change. Cardiovascular: S1 and S2 heard, RRR.  No JVD or murmurs.  1+ pitting bilateral leg edema (no change).  Telemetry personally reviewed: SR-SB in the 58s.  Has breakthrough short periods of Afib w CVR. Abdomen: Nondistended, soft and nontender.  Normal bowel sounds heard.  No organomegaly or masses appreciated. Extremities: Moves all limbs symmetrically and well.  CBC: Recent Labs  Lab 08/12/18 0510 08/13/18 0330 08/14/18 0510 08/15/18 0500  WBC 6.6 6.7 8.3 9.2  NEUTROABS 4.8 4.8 6.6  --   HGB 11.3* 11.5* 11.2* 10.5*  HCT 40.7 41.5 40.3 38.0  MCV 83.9 83.7 84.3 83.9  PLT 108* 104* 108* 342*   Basic Metabolic Panel: Recent Labs  Lab 08/11/18 0501 08/12/18 0510 08/13/18 0330 08/14/18 0510 08/15/18 0500  NA 146* 143 141 141 141  K 3.7 3.8 3.8 3.6 3.1*  CL 94* 94* 92* 92* 94*  CO2 39* 41* 39* 40* 39*  GLUCOSE 87 86 85 92 84  BUN 37* 36* 34* 32* 25*  CREATININE 2.03* 1.73* 1.76* 1.69* 1.64*  CALCIUM 8.8* 8.6* 8.8* 8.9 8.6*  MG 1.9 1.8  --   --   --    GFR: Estimated Creatinine Clearance: 40.2 mL/min (A) (by C-G formula based on SCr of 1.64 mg/dL (H)).  Liver Function Tests: Recent Labs  Lab 08/12/18 0510 08/13/18 0330 08/14/18 0510 08/15/18 0500  AST 10* 13* 12* 12*  ALT 21 20 17 15   ALKPHOS 75 78 70 62  BILITOT 0.9 0.8 1.0 1.2  PROT 5.7* 6.1* 6.1* 5.5*  ALBUMIN 2.6* 2.7* 2.6* 2.5*    Cardiac Enzymes: Recent Labs  Lab 08/10/18 0016  TROPONINI 0.06*    HbA1C: Hgb A1c MFr Bld  Date/Time Value Ref  Range Status  08/06/2018 06:14 AM 6.4 (H) 4.8 - 5.6 % Final    Comment:    (NOTE) Pre diabetes:          5.7%-6.4% Diabetes:              >6.4% Glycemic control for   <7.0% adults with diabetes   11/21/2016 05:41 AM 6.3 (H) 4.8 - 5.6 % Final    Comment:    (NOTE)         Pre-diabetes: 5.7 - 6.4         Diabetes: >6.4         Glycemic control for adults with diabetes: <7.0     CBG: Recent Labs  Lab 08/08/18 2359 08/09/18 0343 08/09/18 0802 08/09/18 1136 08/09/18 1528  GLUCAP 142* 98 86 78 117*  Recent Results (from the past 240 hour(s))  MRSA PCR Screening     Status: None   Collection Time: 08/06/18  5:26 AM  Result Value Ref Range Status   MRSA by PCR NEGATIVE NEGATIVE Final    Comment:        The GeneXpert MRSA Assay (FDA approved for NASAL specimens only), is one component of a comprehensive MRSA colonization surveillance program. It is not intended to diagnose MRSA infection nor to guide or monitor treatment for MRSA infections. Performed at [facility], [place] [address] [place], [place] [address]   Culture, respiratory (non-expectorated)     Status: None   Collection Time: 08/06/18 12:23 PM  Result Value Ref Range Status   Specimen Description TRACHEAL ASPIRATE  Final   Special Requests NONE  Final   Gram Stain   Final    RARE SQUAMOUS EPITHELIAL CELLS PRESENT FEW WBC PRESENT, PREDOMINANTLY PMN FEW GRAM POSITIVE RODS RARE GRAM POSITIVE COCCI Performed at [facility], [place] [address], [place], [place] [address]    Culture MODERATE STREPTOCOCCUS PNEUMONIAE  Final   Report Status 08/09/2018 FINAL  Final   Organism ID, Bacteria STREPTOCOCCUS PNEUMONIAE  Final      Susceptibility   Streptococcus pneumoniae - MIC*    ERYTHROMYCIN 2 RESISTANT Resistant     LEVOFLOXACIN 1 SENSITIVE Sensitive     PENICILLIN (meningitis) 1 RESISTANT Resistant     PENICILLIN (non-meningitis) 1 SENSITIVE Sensitive     CEFTRIAXONE (non-meningitis) 0.5 SENSITIVE  Sensitive     CEFTRIAXONE (meningitis) 0.5 SENSITIVE Sensitive     * MODERATE STREPTOCOCCUS PNEUMONIAE     Scheduled Meds: . apixaban  5 mg Oral BID  . Chlorhexidine Gluconate Cloth  6 each Topical Q0600  . furosemide  40 mg Oral Daily  . hydrALAZINE  12.5 mg Oral QID  . isosorbide mononitrate  15 mg Oral Daily  . ranolazine  500 mg Oral BID  . sodium chloride flush  3 mL Intravenous Q12H   Continuous Infusions: . sodium chloride 10 mL/hr at 08/14/18 1000  . amiodarone 30 mg/hr (08/15/18 1000)     LOS: 10 days   Vernell Leep, MD, FACP, [facility]. [facility] Pager [phone]  If 7PM-7AM, please contact night-coverage [email or web address] Password [facility] 08/15/2018, 4:00 PM

## 2018-08-15 NOTE — Progress Notes (Signed)
Pt ambulated well on a hallway.  O2 sat dropped to lower 80s on RA during ambulation.  Went back up greater than 91% with O2 wL via [place].  Idolina Primer, RN

## 2018-08-16 ENCOUNTER — Inpatient Hospital Stay (HOSPITAL_COMMUNITY): Admit: 2018-08-16 | Payer: Medicare HMO

## 2018-08-16 ENCOUNTER — Encounter (HOSPITAL_COMMUNITY): Payer: BLUE CROSS/BLUE SHIELD | Admitting: Internal Medicine

## 2018-08-16 LAB — COMPREHENSIVE METABOLIC PANEL WITH GFR
ALT: 13 U/L (ref 0–44)
AST: 12 U/L — ABNORMAL LOW (ref 15–41)
Albumin: 2.4 g/dL — ABNORMAL LOW (ref 3.5–5.0)
Alkaline Phosphatase: 60 U/L (ref 38–126)
Anion gap: 7 (ref 5–15)
BUN: 23 mg/dL (ref 8–23)
CO2: 37 mmol/L — ABNORMAL HIGH (ref 22–32)
Calcium: 8.4 mg/dL — ABNORMAL LOW (ref 8.9–10.3)
Chloride: 95 mmol/L — ABNORMAL LOW (ref 98–111)
Creatinine, Ser: 1.65 mg/dL — ABNORMAL HIGH (ref 0.44–1.00)
GFR calc Af Amer: 37 mL/min — ABNORMAL LOW
GFR calc non Af Amer: 32 mL/min — ABNORMAL LOW
Glucose, Bld: 83 mg/dL (ref 70–99)
Potassium: 3.5 mmol/L (ref 3.5–5.1)
Sodium: 139 mmol/L (ref 135–145)
Total Bilirubin: 1 mg/dL (ref 0.3–1.2)
Total Protein: 5.5 g/dL — ABNORMAL LOW (ref 6.5–8.1)

## 2018-08-16 LAB — CBC
HEMATOCRIT: 37.3 % (ref 36.0–46.0)
HEMOGLOBIN: 10.3 g/dL — AB (ref 12.0–15.0)
MCH: 23.1 pg — ABNORMAL LOW (ref 26.0–34.0)
MCHC: 27.6 g/dL — ABNORMAL LOW (ref 30.0–36.0)
MCV: 83.8 fL (ref 78.0–100.0)
Platelets: 123 10*3/uL — ABNORMAL LOW (ref 150–400)
RBC: 4.45 MIL/uL (ref 3.87–5.11)
RDW: 23.2 % — ABNORMAL HIGH (ref 11.5–15.5)
WBC: 6.8 10*3/uL (ref 4.0–10.5)

## 2018-08-16 LAB — COOXEMETRY PANEL
Carboxyhemoglobin: 2.7 % — ABNORMAL HIGH (ref 0.5–1.5)
Methemoglobin: 0.9 % (ref 0.0–1.5)
O2 Saturation: 67.2 %
Total hemoglobin: 10.6 g/dL — ABNORMAL LOW (ref 12.0–16.0)

## 2018-08-16 LAB — MAGNESIUM: Magnesium: 2.2 mg/dL (ref 1.7–2.4)

## 2018-08-16 MED ORDER — POTASSIUM CHLORIDE CRYS ER 20 MEQ PO TBCR
40.0000 meq | EXTENDED_RELEASE_TABLET | Freq: Every day | ORAL | Status: DC
  Administered 2018-08-16 – 2018-08-17 (×2): 40 meq via ORAL
  Filled 2018-08-16 (×2): qty 2

## 2018-08-16 MED ORDER — HYDRALAZINE HCL 25 MG PO TABS
12.5000 mg | ORAL_TABLET | Freq: Three times a day (TID) | ORAL | Status: DC
  Administered 2018-08-16 – 2018-08-17 (×3): 12.5 mg via ORAL
  Filled 2018-08-16 (×6): qty 1

## 2018-08-16 MED ORDER — FUROSEMIDE 10 MG/ML IJ SOLN: 80.0000 mg | Freq: Two times a day (BID) | INTRAMUSCULAR | Status: DC

## 2018-08-16 MED ORDER — AMIODARONE HCL 200 MG PO TABS
400.0000 mg | ORAL_TABLET | Freq: Two times a day (BID) | ORAL | Status: DC
  Administered 2018-08-16 – 2018-08-17 (×3): 400 mg via ORAL
  Filled 2018-08-16 (×4): qty 2

## 2018-08-16 MED ORDER — FUROSEMIDE 10 MG/ML IJ SOLN
80.0000 mg | Freq: Once | INTRAMUSCULAR | Status: AC
  Administered 2018-08-16: 80 mg via INTRAVENOUS
  Filled 2018-08-16: qty 8

## 2018-08-16 MED ORDER — FUROSEMIDE 10 MG/ML IJ SOLN
80.0000 mg | Freq: Two times a day (BID) | INTRAMUSCULAR | Status: DC
  Administered 2018-08-16 – 2018-08-17 (×2): 80 mg via INTRAVENOUS
  Filled 2018-08-16 (×2): qty 8

## 2018-08-16 NOTE — Progress Notes (Signed)
Physical Therapy Treatment Patient Details Name: Patricia Wagner MRN: [id number] DOB: June 10, 1951 Today's Date: 08/16/2018    History of Present Illness 67 y.o. female with acute respiratory failure secondary to acute on chronic biventricular heart failure. VDRF, extubated 9/14;  has a pertinent past medical history including but not limited to Acute hypercapnic respiratory failure ([place]) (04/30/2017), Acute renal failure superimposed on chronic kidney disease ([place]) (10/20/2016), Arthritis, Atrial fibrillation ([place]), Bradycardia (11/20/2016), , Cerebrovascular accident (CVA) due to embolism of left cerebellar artery ([place]),  Chronic anticoagulation (03/15/2018), Chronic atrial fibrillation ([place]) (2/87/8676), Chronic systolic (congestive) heart failure ([place]) (03/05/2018), CKD (chronic kidney disease) stage 3, Hypersomnia, Hypertension    PT Comments    Pt was seen for mobility with ck of O2 sats and pulses, max pulse was 100.  Pt is on 2L O2 per her recommendation from nursing and is motivated to push to walk down the hall.  Follow up with her acutely as she is expecting to go home with minimal help to ALF.  Acute therapy continue to focus on strength, endurance and balance as tolerated.   Follow Up Recommendations  Home health PT;Other (comment)     Equipment Recommendations  (rollator)    Recommendations for Other Services       Precautions / Restrictions Precautions Precautions: Fall Restrictions Weight Bearing Restrictions: No    Mobility  Bed Mobility               General bed mobility comments: up in chair on PT's arrival   Transfers Overall transfer level: Needs assistance Equipment used: Rolling walker (2 wheeled);1 person hand held assist Transfers: Sit to/from Stand Sit to Stand: Supervision;Min guard Stand pivot transfers: Min guard          Ambulation/Gait Ambulation/Gait assistance: Min guard Gait Distance (Feet): 175 Feet Assistive device: Rolling walker (2  wheeled) Gait Pattern/deviations: Step-through pattern;Decreased stride length;Wide base of support Gait velocity: reduced Gait velocity interpretation: <1.31 ft/sec, indicative of household ambulator General Gait Details: gait belt and guarding for safety   Stairs             Wheelchair Mobility    Modified Rankin (Stroke Patients Only)       Balance Overall balance assessment: Needs assistance Sitting-balance support: Feet supported Sitting balance-Leahy Scale: Good     Standing balance support: Bilateral upper extremity supported;During functional activity Standing balance-Leahy Scale: Fair                              Cognition Arousal/Alertness: Awake/alert Behavior During Therapy: WFL for tasks assessed/performed Overall Cognitive Status: Within Functional Limits for tasks assessed                                 General Comments: pt becomes irritated with PT at some point      Exercises      General Comments        Pertinent Vitals/Pain Pain Assessment: No/denies pain    Home Living                      Prior Function            PT Goals (current goals can now be found in the care plan section) Acute Rehab PT Goals Patient Stated Goal: hopes to get home soon Progress towards PT goals: Progressing toward goals  Frequency    Min 3X/week      PT Plan Current plan remains appropriate    Co-evaluation              AM-PAC PT "6 Clicks" Daily Activity  Outcome Measure  Difficulty turning over in bed (including adjusting bedclothes, sheets and blankets)?: A Little Difficulty moving from lying on back to sitting on the side of the bed? : A Little Difficulty sitting down on and standing up from a chair with arms (e.g., wheelchair, bedside commode, etc,.)?: A Little Help needed moving to and from a bed to chair (including a wheelchair)?: A Little Help needed walking in hospital room?: A  Little Help needed climbing 3-5 steps with a railing? : A Lot 6 Click Score: 17    End of Session Equipment Utilized During Treatment: Gait belt;Oxygen Activity Tolerance: Patient tolerated treatment well Patient left: in chair;with call bell/phone within reach;with chair alarm set Nurse Communication: Mobility status;Other (comment) PT Visit Diagnosis: Unsteadiness on feet (R26.81);Other abnormalities of gait and mobility (R26.89);Muscle weakness (generalized) (M62.81)     Time: 6728-9791 PT Time Calculation (min) (ACUTE ONLY): 25 min  Charges:  $Gait Training: 8-22 mins $Therapeutic Activity: 8-22 mins                    Ramond Dial 08/16/2018, 11:18 PM    Mee Hives, PT MS Acute Rehab Dept. Number: [place] [facility] [place]

## 2018-08-16 NOTE — Progress Notes (Signed)
Patricia Wagner  OIZ:[id number] DOB: Mar 10, 1951 DOA: 08/05/2018 PCP: Biagio Borg, MD    Brief Narrative:  67 y/o female with a history of CVA, HTN, HLD, Diverticulosis w/ GIB, CKD stage IV, PAF on eliquis, severe RV dysfxn, and systolic heart failure (EF 15%) who developed acute respiratory failure with hypoxemia due to a heart failure exacerbation.   Significant Events: 9/12 admit  9/13 intubated 9/15 extubated  9/16 TRH assumed care   Subjective: Reports no BM after Imodium 9/22 morning.  Slept well last night.  Denies dyspnea.  Ambulated yesterday.  Assessment & Plan:  Acute hypoxic respiratory failure  Secondary to decompensated CHF.  Extubated 9/15.  Saturating in the mid 90s on 2 L/min oxygen.  Wean oxygen as tolerated.  Reassess home oxygen needs prior to discharge.  Facial droop - slurred speech Exact etiology not clear - resolved - CT head and MRI w/o acute findings - ?recurdescence of prior stroke deficits in setting of decreased CO due to acute CHF exac.  Resolved without recurrence.  Acute on chronic systolic and diastolic CHF due to NICM Management per advanced HF, input appreciated.  Suspecting A. fib rate related cardiomyopathy.  TTE 03/06/2018: LVEF 40-45%.  Bedside echo by cardiology 9/12: LVEF 40-45%.  Amyloid work-up negative with negative fat pad biopsy and PYP scan.  On aggressive IV Lasix diuresis and -21.26 L & 17 pounds down since admission.  Imdur, hydralazine and Coreg were on hold due to soft blood pressures.  No Aldactone or Entresto due to CKD.  Cardiology restarted Lasix 40 mg orally daily, initiated low-dose hydralazine and Imdur.  Still has some volume overload mostly in her lower extremities and urine output seems to have dropped off on oral Lasix (1480 PM yesterday).  Await cardiology follow-up.  Persistent Atrial fibrillation Management per cardiology.  Cannot tolerate A. fib with recurrent cardiogenic shock when A. fib recurs.  Has had multiple DCCV's  in the past.  Currently on IV amiodarone drip.  Mostly sinus rhythm but had brief periods of A. fib overnight.  Per cardiology, continue amiodarone for another day and then consider transitioning to oral.  Ranexa resumed.  Continue Eliquis 5 mg twice daily.  EP follows outpatient and holding off on ablation for now because she needs lumpectomy.CHA2DS2-VASc score is 5.  Ongoing intermittent breakthrough A. fib.  Remains on IV amiodarone drip.  Management per cardiology.  No change in status, had approximately 2-hour A. fib 9/22 morning and half an hour in the afternoon.  Acute kidney injury on CKD Stage IV Baseline crt appears to have increased in last month to ~2.0-2.4 - crt appears to be slowly improving - cont to follow - avoid potential nephrotoxins.  Baseline apparently in the 1.6-1.8 range.  Creatinine back to baseline 1.6 stable for the last couple of days  Strep pneumo tracheobronchitis  Trach asp cx +.  Completed course of IV ceftriaxone.  Mild transaminitis likely due to passive congestion in setting of RV failure - follow intermittently.  Resolved.  Hypokalemia/hypomagnesemia  replace and follow as needed.  Suspected sleep apnea Refused sleep study in the past.  Reconsider evaluation as outpatient.  L breast papilloma To have lumpectomy per Dr. Brantley Stage when stable medically   Essential hypertension Controlled.  Normocytic anemia:  Stable.  Thrombocytopenia:  Stable.  Diarrhea Likely due to magnesium.  Magnesium stopped.  PRN Imodium.  Low index of suspicion for C. difficile.  Resolved after dose of Imodium 9/22   DVT prophylaxis: Eliquis  Code Status: Full Family Communication: None at bedside Disposition Plan: DC home pending clinical improvement and cardiology clearance  Consultants:  CHF Team  PCCM  Antimicrobials:  Unasyn 9/13  Rocephin 9/16 >9/20  Objective:  Vitals:   08/15/18 2039 08/15/18 2119 08/16/18 0002 08/16/18 0550  BP: (!) 92/54 (!) 100/56  (!) 99/57 108/61  Pulse: (!) 57   65  Resp: 19   16  Temp: 97.7 F (36.5 C)  99.1 F (37.3 C) 99 F (37.2 C)  TempSrc: Oral  Oral Oral  SpO2: 100%     Weight:    99.2 kg  Height:         Intake/Output Summary (Last 24 hours) at 08/16/2018 0845 Last data filed at 08/16/2018 0500 Gross per 24 hour  Intake 1150.1 ml  Output 1480 ml  Net -329.9 ml   Filed Weights   08/14/18 0451 08/15/18 0342 08/16/18 0550  Weight: 98 kg 97.3 kg 99.2 kg    Examination:  General: Pleasant middle-aged female, moderately built and nourished, sitting up comfortably in reclining chair this morning. Lungs: Occasional left basal crackles but otherwise clear to auscultation.  No increased work of breathing. Cardiovascular: S1 and S2 heard, RRR.  No JVD or murmurs.  1+ pitting bilateral leg edema (no change).  Telemetry personally reviewed: Remains in sinus rhythm.  Patient had a approximately 2-hour A. fib 9/22 morning and half an hour A. fib 9/22 afternoon. Abdomen: Nondistended, soft and nontender.  Normal bowel sounds heard.  No organomegaly or masses appreciated.  Stable. Extremities: Moves all limbs symmetrically and well. CNS: Alert and oriented.  No focal neurological deficits.  CBC: Recent Labs  Lab 08/12/18 0510 08/13/18 0330 08/14/18 0510 08/15/18 0500 08/16/18 0456  WBC 6.6 6.7 8.3 9.2 6.8  NEUTROABS 4.8 4.8 6.6  --   --   HGB 11.3* 11.5* 11.2* 10.5* 10.3*  HCT 40.7 41.5 40.3 38.0 37.3  MCV 83.9 83.7 84.3 83.9 83.8  PLT 108* 104* 108* 113* 100*   Basic Metabolic Panel: Recent Labs  Lab 08/11/18 0501 08/12/18 0510  08/14/18 0510 08/15/18 0500 08/16/18 0456  NA 146* 143   < > 141 141 139  K 3.7 3.8   < > 3.6 3.1* 3.5  CL 94* 94*   < > 92* 94* 95*  CO2 39* 41*   < > 40* 39* 37*  GLUCOSE 87 86   < > 92 84 83  BUN 37* 36*   < > 32* 25* 23  CREATININE 2.03* 1.73*   < > 1.69* 1.64* 1.65*  CALCIUM 8.8* 8.6*   < > 8.9 8.6* 8.4*  MG 1.9 1.8  --   --   --  2.2   < > = values in  this interval not displayed.   GFR: Estimated Creatinine Clearance: 40.4 mL/min (A) (by C-G formula based on SCr of 1.65 mg/dL (H)).  Liver Function Tests: Recent Labs  Lab 08/13/18 0330 08/14/18 0510 08/15/18 0500 08/16/18 0456  AST 13* 12* 12* 12*  ALT 20 17 15 13   ALKPHOS 78 70 62 60  BILITOT 0.8 1.0 1.2 1.0  PROT 6.1* 6.1* 5.5* 5.5*  ALBUMIN 2.7* 2.6* 2.5* 2.4*    Cardiac Enzymes: Recent Labs  Lab 08/10/18 0016  TROPONINI 0.06*    HbA1C: Hgb A1c MFr Bld  Date/Time Value Ref Range Status  08/06/2018 06:14 AM 6.4 (H) 4.8 - 5.6 % Final    Comment:    (NOTE) Pre diabetes:  5.7%-6.4% Diabetes:              >6.4% Glycemic control for   <7.0% adults with diabetes   11/21/2016 05:41 AM 6.3 (H) 4.8 - 5.6 % Final    Comment:    (NOTE)         Pre-diabetes: 5.7 - 6.4         Diabetes: >6.4         Glycemic control for adults with diabetes: <7.0     CBG: Recent Labs  Lab 08/09/18 1136 08/09/18 1528  GLUCAP 78 117*    Recent Results (from the past 240 hour(s))  Culture, respiratory (non-expectorated)     Status: None   Collection Time: 08/06/18 12:23 PM  Result Value Ref Range Status   Specimen Description TRACHEAL ASPIRATE  Final   Special Requests NONE  Final   Gram Stain   Final    RARE SQUAMOUS EPITHELIAL CELLS PRESENT FEW WBC PRESENT, PREDOMINANTLY PMN FEW GRAM POSITIVE RODS RARE GRAM POSITIVE COCCI Performed at [facility], [place] [address], [place], [place] [address]    Culture MODERATE STREPTOCOCCUS PNEUMONIAE  Final   Report Status 08/09/2018 FINAL  Final   Organism ID, Bacteria STREPTOCOCCUS PNEUMONIAE  Final      Susceptibility   Streptococcus pneumoniae - MIC*    ERYTHROMYCIN 2 RESISTANT Resistant     LEVOFLOXACIN 1 SENSITIVE Sensitive     PENICILLIN (meningitis) 1 RESISTANT Resistant     PENICILLIN (non-meningitis) 1 SENSITIVE Sensitive     CEFTRIAXONE (non-meningitis) 0.5 SENSITIVE Sensitive     CEFTRIAXONE (meningitis)  0.5 SENSITIVE Sensitive     * MODERATE STREPTOCOCCUS PNEUMONIAE     Scheduled Meds: . apixaban  5 mg Oral BID  . Chlorhexidine Gluconate Cloth  6 each Topical Q0600  . furosemide  40 mg Oral Daily  . hydrALAZINE  12.5 mg Oral QID  . isosorbide mononitrate  15 mg Oral Daily  . ranolazine  500 mg Oral BID  . sodium chloride flush  3 mL Intravenous Q12H   Continuous Infusions: . sodium chloride 10 mL/hr at 08/14/18 1000  . amiodarone 30 mg/hr (08/16/18 0430)     LOS: 11 days   Vernell Leep, MD, FACP, [facility]. [facility] Pager [phone]  If 7PM-7AM, please contact night-coverage [email or web address] Password [facility] 08/16/2018, 8:45 AM

## 2018-08-16 NOTE — Progress Notes (Addendum)
Advanced Heart Failure Rounding Note  PCP-Cardiologist: Glori Bickers, MD   Subjective:    Remains on IV amio. NSR this am. 2 episodes of breakthrough Afib yesterday.   Coox 67.2% this am. Cr 1.65 this am. K 3.5.   Feeling OK today. Debating as to whether she wants SNF or not. Denies SOB. No lightheadedness or dizziness. Still has mild edema in her BLEs, she says this is near her baseline.   Bedside echo completed by Dr [name] 08/06/18 which showed EF 40-45%, but RV severely reduced.   Objective:   Weight Range: 99.2 kg Body mass index is 35.28 kg/m.   Vital Signs:   Temp:  [97.7 F (36.5 C)-99.1 F (37.3 C)] 99 F (37.2 C) (09/23 0550) Pulse Rate:  [57-65] 65 (09/23 0550) Resp:  [13-19] 16 (09/23 0550) BP: (92-108)/(54-62) 108/61 (09/23 0550) SpO2:  [89 %-100 %] 100 % (09/22 2039) Weight:  [99.2 kg] 99.2 kg (09/23 0550) Last BM Date: 08/15/18  Weight change: Filed Weights   08/14/18 0451 08/15/18 0342 08/16/18 0550  Weight: 98 kg 97.3 kg 99.2 kg   Intake/Output:  Intake/Output Summary (Last 24 hours) at 08/16/2018 0843 Last data filed at 08/16/2018 0500 Gross per 24 hour  Intake 1150.1 ml  Output 1480 ml  Net -329.9 ml    Physical Exam    General: NAD  HEENT: Normal Neck: Supple. JVP 6-7 cm. Carotids 2+ bilat; no bruits. No thyromegaly or nodule noted. RIJ TLC.  Cor: PMI nondisplaced. RRR, No M/G/R noted Lungs: CTAB, normal effort. Abdomen: Soft, non-tender, non-distended, no HSM. No bruits or masses. +BS  Extremities: No cyanosis, clubbing, or rash. 2+ edema 1/2 to knees.  Neuro: Alert & orientedx3, cranial nerves grossly intact. moves all 4 extremities w/o difficulty. Affect pleasant   Telemetry   NSR/Sbrady 50-60s, 2 short episodes of Afib yesterday, personally reviewed.   EKG    No new tracings.    Labs    CBC Recent Labs    08/14/18 0510 08/15/18 0500 08/16/18 0456  WBC 8.3 9.2 6.8  NEUTROABS 6.6  --   --   HGB 11.2* 10.5*  10.3*  HCT 40.3 38.0 37.3  MCV 84.3 83.9 83.8  PLT 108* 113* 858*   Basic Metabolic Panel Recent Labs    08/15/18 0500 08/16/18 0456  NA 141 139  K 3.1* 3.5  CL 94* 95*  CO2 39* 37*  GLUCOSE 84 83  BUN 25* 23  CREATININE 1.64* 1.65*  CALCIUM 8.6* 8.4*  MG  --  2.2   Liver Function Tests Recent Labs    08/15/18 0500 08/16/18 0456  AST 12* 12*  ALT 15 13  ALKPHOS 62 60  BILITOT 1.2 1.0  PROT 5.5* 5.5*  ALBUMIN 2.5* 2.4*   No results for input(s): LIPASE, AMYLASE in the last 72 hours. Cardiac Enzymes No results for input(s): CKTOTAL, CKMB, CKMBINDEX, TROPONINI in the last 72 hours. BNP: BNP (last 3 results) Recent Labs    08/05/18 2155  BNP 3,951.4*    ProBNP (last 3 results) No results for input(s): PROBNP in the last 8760 hours.   D-Dimer No results for input(s): DDIMER in the last 72 hours. Hemoglobin A1C No results for input(s): HGBA1C in the last 72 hours. Fasting Lipid Panel No results for input(s): CHOL, HDL, LDLCALC, TRIG, CHOLHDL, LDLDIRECT in the last 72 hours. Thyroid Function Tests No results for input(s): TSH, T4TOTAL, T3FREE, THYROIDAB in the last 72 hours.  Invalid input(s): FREET3  Other results:  Imaging   No results found.  Medications:    Scheduled Medications: . apixaban  5 mg Oral BID  . Chlorhexidine Gluconate Cloth  6 each Topical Q0600  . furosemide  40 mg Oral Daily  . hydrALAZINE  12.5 mg Oral QID  . isosorbide mononitrate  15 mg Oral Daily  . ranolazine  500 mg Oral BID  . sodium chloride flush  3 mL Intravenous Q12H    Infusions: . sodium chloride 10 mL/hr at 08/14/18 1000  . amiodarone 30 mg/hr (08/16/18 0430)    PRN Medications: sodium chloride, acetaminophen, fentaNYL (SUBLIMAZE) injection, loperamide, sodium chloride flush  Patient Profile   Patricia Wagner is a 67 y.o. female with a past medical history of paroxysmalatrial fibrillation, NICM cardiomyopathy, Biventricular CHF (LVEF 15% and RV severely  reduced by TEE 03/25/17), HTN, hx of recurrent GIB, COPD, anemia, chronic foley catheter, and HLD.   Admitted with facial droop/slurred speech and hypoxia. Found to be in A/C hypoxic respiratory failure and biventricular HF.   Assessment/Plan   1. A/C hypoxic respiratory failure in COPD - Intubated 9/13, extubated 08/07/18.  - Stable on RA - Weight down 20 lbs total with IV diuresis. Now back to baseline.   2. Facial droop/slurred speech - Head CT negative - MRI showed no acute changes.  - Urine culture with rare bacteria + casts.  - Alert and oriented today. No change.   3. Acute on chronic biventricular CHF due to NICM (suspect AF cardiomyopathy)  - TEE 03/25/17 with LVEF 15% and severely reduced RV function.  - Echo 06/2017 EF 30-35% (up from 15% in setting of AF in 5/18).  - Echo 02/2018: EF 40-45%, grade 1 DD, RV normal - Amyloid w/u negative with negative fat pad biopsy and PYP scan  - Volume status elevated on exam.  - Place ted hose.  - Switch back to IV lasix for 80 mg BID today.  - Bedside echo 9/12, showed EF 40-45%, but RV severely reduced. Low suspicion for PE since she is on chronic AC.  - No BB with bradycardia.  -Continue hydral 12.5 mg TID and imdur 15 mg daily.  - no ACE/ARB with CKD  4. Persistent Afib: Cannot tolerate AF with recurrent shock. S/p DCCV on 5/2 (she stopped Amio after this) and DCCV on 05/06/17 and again 05/12/17. - She absolutely cannot tolerate AF with recurrent cardiogenic shock when AF recurs. - Remains in NSR today, 2 episodes of breakthrough Afib yestereday - Coox stable 67.2%  - Change to po amiodarone 400 mg BID x 1 week, then 200 mg BID. She tolerates afib very poorly.  - Continue Ranexa carefully with CKD - Continue Eliquis 5 mg BID - EP follows outpatient. Holding off on ablation for now because she needs lumpectomy.  5. Suspected Sleep Apnea: - Refuses sleep study. No change.   6. AKI on CKD -  Baseline 1.6-1.8 - Cr up to  3.89 this admit, but now back down to baseline at 1.65.   7. HTN - BP remains 90-100s. Back on hydral imdur.   8. Anemia - Hgb 11.2. Stable.  9. Hypomagnesiumia - Mag 2.2 today.   10. Diarrhea/loose stools - Likely due to Mag.  - Resolved off Mg and with prn imodium   Length of Stay: [place], [place]  08/16/2018, 8:43 AM  Advanced Heart Failure Team Pager [phone] (M-F; 7a - 4p)  Please contact [facility] for night-coverage after hours (4p -7a ) and weekends  on [email or web address]   Patient seen and examined with the above-signed Advanced Practice Provider and/or Housestaff. I personally reviewed laboratory data, imaging studies and relevant notes. I independently examined the patient and formulated the important aspects of the plan. I have edited the note to reflect any of my changes or salient points. I have personally discussed the plan with the patient and/or family.  Overall feeling better. Maintaining NSR except for some very short breakthroughs. Volume status getting worse. Will switch IV amio to po. Resume IV lasix. Can go home once volume status improved. Continue Eliquis for [facility]. No bleeding.   Glori Bickers, MD  2:04 PM

## 2018-08-17 ENCOUNTER — Encounter (HOSPITAL_COMMUNITY): Payer: Medicare HMO

## 2018-08-17 LAB — CBC
HEMATOCRIT: 37.9 % (ref 36.0–46.0)
HEMOGLOBIN: 10.5 g/dL — AB (ref 12.0–15.0)
MCH: 23.1 pg — ABNORMAL LOW (ref 26.0–34.0)
MCHC: 27.7 g/dL — ABNORMAL LOW (ref 30.0–36.0)
MCV: 83.5 fL (ref 78.0–100.0)
Platelets: 136 10*3/uL — ABNORMAL LOW (ref 150–400)
RBC: 4.54 MIL/uL (ref 3.87–5.11)
RDW: 23.3 % — ABNORMAL HIGH (ref 11.5–15.5)
WBC: 6.1 10*3/uL (ref 4.0–10.5)

## 2018-08-17 LAB — COMPREHENSIVE METABOLIC PANEL
ALBUMIN: 2.5 g/dL — AB (ref 3.5–5.0)
ALT: 12 U/L (ref 0–44)
AST: 12 U/L — ABNORMAL LOW (ref 15–41)
Alkaline Phosphatase: 61 U/L (ref 38–126)
Anion gap: 10 (ref 5–15)
BUN: 23 mg/dL (ref 8–23)
CO2: 36 mmol/L — AB (ref 22–32)
Calcium: 8.6 mg/dL — ABNORMAL LOW (ref 8.9–10.3)
Chloride: 94 mmol/L — ABNORMAL LOW (ref 98–111)
Creatinine, Ser: 1.69 mg/dL — ABNORMAL HIGH (ref 0.44–1.00)
GFR calc Af Amer: 36 mL/min — ABNORMAL LOW (ref >60)
GFR calc non Af Amer: 31 mL/min — ABNORMAL LOW (ref >60)
GLUCOSE: 76 mg/dL (ref 70–99)
POTASSIUM: 3.8 mmol/L (ref 3.5–5.1)
SODIUM: 140 mmol/L (ref 135–145)
Total Bilirubin: 1.3 mg/dL — ABNORMAL HIGH (ref 0.3–1.2)
Total Protein: 5.7 g/dL — ABNORMAL LOW (ref 6.5–8.1)

## 2018-08-17 LAB — COOXEMETRY PANEL
Carboxyhemoglobin: 2.8 % — ABNORMAL HIGH (ref 0.5–1.5)
Methemoglobin: 1 % (ref 0.0–1.5)
O2 Saturation: 77.6 %
Total hemoglobin: 11 g/dL — ABNORMAL LOW (ref 12.0–16.0)

## 2018-08-17 MED ORDER — ISOSORBIDE MONONITRATE ER 30 MG PO TB24: 15.0000 mg | ORAL_TABLET | Freq: Every day | ORAL | 0 refills | Status: DC

## 2018-08-17 MED ORDER — POTASSIUM CHLORIDE CRYS ER 20 MEQ PO TBCR: 40.0000 meq | EXTENDED_RELEASE_TABLET | Freq: Every day | ORAL | 0 refills | Status: DC

## 2018-08-17 MED ORDER — FUROSEMIDE 80 MG PO TABS: 80.0000 mg | ORAL_TABLET | Freq: Every day | ORAL | Status: DC

## 2018-08-17 MED ORDER — FUROSEMIDE 80 MG PO TABS: 80.0000 mg | ORAL_TABLET | Freq: Every day | ORAL | 0 refills | Status: DC

## 2018-08-17 MED ORDER — AMIODARONE HCL 200 MG PO TABS: ORAL_TABLET | ORAL | 0 refills | Status: DC

## 2018-08-17 MED ORDER — HYDRALAZINE HCL 25 MG PO TABS: 12.5000 mg | ORAL_TABLET | Freq: Three times a day (TID) | ORAL | 0 refills | Status: DC

## 2018-08-17 NOTE — Discharge Summary (Addendum)
Physician Discharge Summary  Patricia [facility]:[id number] DOB: [birth year]  PCP: Biagio Borg, MD  Admit date: 08/05/2018 Discharge date: 08/17/2018  Recommendations for Outpatient Follow-up:  1. Dr. Cathlean Cower, PCP in 1 week with repeat labs (CBC & BMP). 2. [facility]: Cardiology will arrange appropriate follow-up. 3. Recommend repeating urine microscopy in a couple of weeks to follow-up on microscopic hematuria.  Home Health: PT Equipment/Devices: Rollator. Home O2 at 3L/min continuously  Discharge Condition: Improved and stable. CODE STATUS: Full Diet recommendation: Heart healthy diet.  Discharge Diagnoses:  Principal Problem:   Acute on chronic combined systolic and diastolic CHF (congestive heart failure) (HCC) Active Problems:   Essential hypertension   CAD in native artery, s/p cardiac cath with non obstructive CAD   PAF (paroxysmal atrial fibrillation) (HCC)   Acute respiratory failure with hypoxia (HCC)   Acute renal failure superimposed on stage 4 chronic kidney disease (HCC)   Elevated troponin   Hyperbilirubinemia   Abnormal transaminases   Dysarthria   Cardiogenic shock (HCC)   Acute on chronic respiratory failure with hypoxia (HCC)   Brief Summary: 67 y/o female with a history of CVA, HTN, HLD, Diverticulosis w/ GIB, CKD stage IV, PAF on eliquis, severe RV dysfxn, and systolic heart failure (EF 15%) who developed acute respiratory failure with hypoxemia due to a heart failure exacerbation.  Significant Events: 9/12 admit  9/13 intubated 9/15 extubated  9/16 [facility] assumed care    Assessment & Plan:  Acute hypoxic respiratory failure  Secondary to decompensated CHF.  Extubated 9/15.  She was saturating at 97% on room air.  Requested RN to do evaluation for home O2 needs prior to discharge.  Improved.  Facial droop - slurred speech Exact etiology not clear - resolved - CT head and MRI w/o acute findings - ?recurdescence of prior stroke  deficits in setting of decreased CO due to acute CHF exac.  Resolved without recurrence.  Acute on chronic systolic and diastolic CHF due to NICM Management was directed by the advanced HF team.  Suspecting A. fib rate related cardiomyopathy.  TTE 03/06/2018: LVEF 40-45%.  Bedside echo by cardiology 9/12: LVEF 40-45%.  Amyloid work-up negative with negative fat pad biopsy and PYP scan.  On aggressive IV Lasix diuresis and - 24 L & 21 pounds down since admission.  Imdur, hydralazine and Coreg were on hold due to soft blood pressures.  No Aldactone or Entresto due to CKD.  Cardiology restarted Lasix 40 mg orally daily, initiated low-dose hydralazine and Imdur. HF team has seen her today and cleared her for discharge home on HF medications as outlined below.  Cardiology will arrange outpatient follow-up with labs.  She has lower extremity compressive stockings.  Clinically improved and stable.  HF Medication Recommendations for Home: Lasix 80 mg daily. Hydralazine 12.5 mg TID Imdur 15 mg daily Eliquis 5 mg BID  Ranexa 500 mg BID Amiodarone 400 mg BID x 1 week, then 200 mg BID.  Potassium 40 meq daily.  Persistent Atrial fibrillation Management per cardiology.  Cannot tolerate A. fib with recurrent cardiogenic shock when A. fib recurs.  Has had multiple DCCV's in the past.  Was on IV amiodarone drip then transitioned to p.o. Ranexa resumed.  Continue Eliquis 5 mg twice daily.  EP follows outpatient and holding off on ablation for now because she needs lumpectomy. CHA2DS2-VASc score is 5.  Ongoing intermittent breakthrough A. fib but only mild.  HF team have seen today and cleared her  for discharge home on above dose of amiodarone, Eliquis and Ranexa.  They will arrange outpatient follow-up.  Acute kidney injury on CKD Stage IV Baseline crt appears to have increased in last month to ~2.0-2.4 - crt appears to be slowly improving - cont to follow - avoid potential nephrotoxins.  Baseline apparently in  the 1.6-1.8 range.  Creatinine back to baseline 1.6 stable for the last several days.  Closely follow BMP as outpatient.  Strep pneumo tracheobronchitis  Trach asp cx +.  Completed course of IV ceftriaxone.  Improved and stable.  Mild transaminitis likely due to passive congestion in setting of RV failure - follow intermittently.  Resolved.  Hypokalemia/hypomagnesemia  replaced.  Suspected sleep apnea Refused sleep studyin the past.  Reconsider evaluation as outpatient.  L breast papilloma To have lumpectomy per Dr. Brantley Stage when stable medically   Essential hypertension Controlled.  Normocytic anemia:  Stable.  Thrombocytopenia:  Stable.  Diarrhea Likely due to magnesium.  Magnesium stopped.  PRN Imodium.  Low index of suspicion for C. difficile.  Resolved after dose of Imodium 9/22  Microscopic hematuria Noted on urine microscopy 9/13.  Patient on anticoagulation.  Unclear etiology.  She has chronic indwelling Foley catheter.  Recommend repeating urine microscopy in a couple of weeks to reassess and consider further evaluation if persistent.  Chronic Foley catheter Patient reports that she follows with outpatient Urology for same.     Consultants:  CHF Team  PCCM   Discharge Instructions  Discharge Instructions    (HEART FAILURE PATIENTS) Call MD:  Anytime you have any of the following symptoms: 1) 3 pound weight gain in 24 hours or 5 pounds in 1 week 2) shortness of breath, with or without a dry hacking cough 3) swelling in the hands, feet or stomach 4) if you have to sleep on extra pillows at night in order to breathe.   Complete by:  As directed    Call MD for:  difficulty breathing, headache or visual disturbances   Complete by:  As directed    Call MD for:  extreme fatigue   Complete by:  As directed    Call MD for:  persistant dizziness or light-headedness   Complete by:  As directed    Call MD for:  severe uncontrolled pain   Complete by:  As  directed    Diet - low sodium heart healthy   Complete by:  As directed    Increase activity slowly   Complete by:  As directed        Medication List    STOP taking these medications   carvedilol 3.125 MG tablet Commonly known as:  COREG   Investigational - Study Medication     TAKE these medications   amiodarone 200 MG tablet Commonly known as:  PACERONE 400 mg (2 tabs) two times daily for 1 week and then 200 mg (1 tab) two times daily thereafter. What changed:    how much to take  how to take this  when to take this  additional instructions   apixaban 5 MG Tabs tablet Commonly known as:  ELIQUIS Take 1 tablet (5 mg total) by mouth 2 (two) times daily.   epoetin alfa 10000 UNIT/ML injection Commonly known as:  EPOGEN,PROCRIT Inject 5,000 Units into the skin once a week.   furosemide 80 MG tablet Commonly known as:  LASIX Take 1 tablet (80 mg total) by mouth daily. What changed:    medication strength  how much to take  hydrALAZINE 25 MG tablet Commonly known as:  APRESOLINE Take 0.5 tablets (12.5 mg total) by mouth 3 (three) times daily. What changed:    medication strength  how much to take   isosorbide mononitrate 30 MG 24 hr tablet Commonly known as:  IMDUR Take 0.5 tablets (15 mg total) by mouth daily. What changed:    medication strength  how much to take   potassium chloride SA 20 MEQ tablet Commonly known as:  K-DUR,KLOR-CON Take 2 tablets (40 mEq total) by mouth daily. Start taking on:  08/18/2018   ranolazine 500 MG 12 hr tablet Commonly known as:  RANEXA TAKE 1 TABLET BY MOUTH TWICE A DAY   rosuvastatin 10 MG tablet Commonly known as:  CRESTOR Take 10 mg by mouth daily.      Follow-up Information    [facility] Follow up on 08/24/2018.   Specialty:  Cardiology Why:  at 1200 Noon - Park/Dropoff at ER lot [facility] under blue "Specialty Clinics" awning) or under [place]  on [address] ([employer], garage code: [id number], elevator 1st floor). Take all am meds, bring all med bottles. Contact information: [address] [id number] [place]       Biagio Borg, MD. Schedule an appointment as soon as possible for a visit in 1 week(s).   Specialties:  Internal Medicine, Radiology Why:  To be seen with repeat labs (CBC & BMP). Contact information: [place] [address] [phone]          Allergies  Allergen Reactions  . Ace Inhibitors Palpitations      Procedures/Studies: Dg Chest 1 View  Result Date: 08/06/2018 CLINICAL DATA:  Central line placement. EXAM: CHEST  1 VIEW COMPARISON:  Earlier today. FINDINGS: Interval right jugular catheter with its tip in the superior vena cava. No pneumothorax. Interval endotracheal tube in satisfactory position. Interval nasogastric tube extending into the stomach. Stable enlarged cardiac silhouette. Decreased retrocardiac opacity on the left. Stable prominence of the pulmonary vasculature and interstitial markings. No pleural fluid seen. Thoracic spine degenerative changes. IMPRESSION: 1. Right jugular catheter tip in the superior vena cava without pneumothorax. 2. Decreased left lower lobe atelectasis or pneumonia. 3. Stable cardiomegaly and mild changes of congestive heart failure. Electronically Signed   By: Claudie Revering M.D.   On: 08/06/2018 13:08   Dg Chest 2 View  Result Date: 08/05/2018 CLINICAL DATA:  Slurred speech, facial droop. EXAM: CHEST - 2 VIEW COMPARISON:  Chest x-rays dated 03/05/2018 and 03/30/2017. FINDINGS: Stable cardiomegaly. Chronic central pulmonary vascular congestion. No new confluent opacity to suggest a developing pneumonia. N questionable trace pleural effusion at the posterior costophrenic angles. No large pleural effusion. No pneumothorax. Osseous structures are unremarkable. IMPRESSION: 1. Stable cardiomegaly.  Chronic central pulmonary vascular congestion, consistent with chronic mild CHF. 2. Questionable small pleural effusion at the posterior costophrenic angle. No evidence of pneumonia or overt alveolar pulmonary edema. Electronically Signed   By: Franki Cabot M.D.   On: 08/05/2018 21:58   Dg Abd 1 View  Result Date: 08/06/2018 CLINICAL DATA:  Orogastric tube placement. EXAM: ABDOMEN - 1 VIEW COMPARISON:  12/02/2017 FINDINGS: Orogastric tube extends into the abdomen. The tip is in the right abdomen and probably in the proximal duodenum. Cholecystectomy clips in the right upper abdomen. IMPRESSION: Orogastric tube tip in the region of the proximal duodenum. Electronically Signed   By: Scherrie Gerlach.D.  On: 08/06/2018 13:19   Ct Head Wo Contrast  Result Date: 08/05/2018 CLINICAL DATA:  Slurred speech. Decreased grip strength. Right facial droop. Symptoms for 1 week. EXAM: CT HEAD WITHOUT CONTRAST TECHNIQUE: Contiguous axial images were obtained from the base of the skull through the vertex without intravenous contrast. COMPARISON:  12/29/2017 FINDINGS: Brain: Encephalomalacia in the left cerebellar hemisphere is stable. There is encephalomalacia in the right posterior parietal lobe which is stable. There is no mass effect, midline shift, or acute hemorrhage. Vascular: No hyperdense vessel or unexpected calcification. Skull: Intact. Sinuses/Orbits: There is scattered fluid in the bilateral mastoid air cells. This is a new finding compared to the prior study. Visualized paranasal sinuses are clear. Unremarkable orbits. 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 IMPRESSION: No acute intracranial pathology. There is fluid in the bilateral mastoid air cells Chronic changes are noted. Electronically Signed   By: Marybelle Killings M.D.   On: 08/05/2018 21:49   Mr Brain Wo Contrast  Result Date: 08/06/2018 CLINICAL DATA:  67 y/o  F; slurred speech and right facial droop. EXAM: MRI HEAD WITHOUT CONTRAST TECHNIQUE:  Multiplanar, multiecho pulse sequences of the brain and surrounding structures were obtained without intravenous contrast. COMPARISON:  08/05/2018 CT head.  11/20/2016 MRI head. FINDINGS: Brain: No acute infarction, hemorrhage, hydrocephalus, extra-axial collection or mass lesion. Small chronic cortical infarction within the right occipital lobe and left superior cerebellar hemisphere. Mild chronic microvascular ischemic changes of white matter and volume loss of the brain. Hemosiderin staining of the left superior cerebellar infarction. Vascular: Normal flow voids. Skull and upper cervical spine: Normal marrow signal. Sinuses/Orbits: Left anterior ethmoid mucous retention cyst. Bilateral mastoid opacification. Orbits are unremarkable. 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 IMPRESSION: 1. No acute intracranial abnormality identified. 2. Small chronic infarctions within the right occipital lobe and the left superior cerebellar hemisphere. 3. Mild chronic microvascular ischemic changes and volume loss of the brain. 4. Bilateral mastoid opacification. Electronically Signed   By: Kristine Garbe M.D.   On: 08/06/2018 03:42   US Abdomen Complete  Result Date: 08/06/2018 CLINICAL DATA:  67 y/o F; abnormal liver function tests. Chronic kidney disease stage 4. Acute renal failure. EXAM: ABDOMEN ULTRASOUND COMPLETE COMPARISON:  12/02/2017 CT abdomen and pelvis. 03/08/2018 renal ultrasound. FINDINGS: Gallbladder: Resected. Common bile duct: Diameter: 5.3 mm Liver: No focal lesion identified. Within normal limits in parenchymal echogenicity. Portal vein is patent on color Doppler imaging with normal direction of blood flow towards the liver. IVC: No abnormality visualized. Pancreas: Poorly visualized due to bowel gas shadow. Spleen: Size and appearance within normal limits. Right Kidney: Length: 10.6 cm. Echogenicity within normal limits. No mass or hydronephrosis  visualized. Left Kidney: Length: 11.0 cm. Echogenicity within normal limits. Lower pole simple cyst measuring up to 4.1 cm. Abdominal aorta: Abdominal aortic atherosclerosis with wall calcifications. Greatest diameter 2.4 cm. Other findings: Foley catheter in situ. IMPRESSION: 1. No acute process identified. 2. Stable left kidney lower pole cyst. 3.  Aortic Atherosclerosis (ICD10-I70.0). Electronically Signed   By: Kristine Garbe M.D.   On: 08/06/2018 01:16   Dg Chest Port 1 View  Result Date: 08/08/2018 CLINICAL DATA:  Hypoxia EXAM: PORTABLE CHEST 1 VIEW COMPARISON:  08/07/2018 FINDINGS: Cardiac shadow is again enlarged. Aortic calcifications are again seen. Right jugular central line is again noted and stable. The lungs are well aerated bilaterally. Resolution of previously seen vascular congestion is noted. No focal infiltrate is noted. IMPRESSION: No acute abnormality seen.  Electronically Signed   By: Inez Catalina M.D.   On: 08/08/2018 07:43   Portable Chest Xray  Result Date: 08/07/2018 CLINICAL DATA:  Aspiration pneumonia EXAM: PORTABLE CHEST 1 VIEW COMPARISON:  08/06/2018 FINDINGS: Cardiac shadow is again enlarged. The endotracheal tube, right jugular central line and nasogastric catheter are again seen in satisfactory position. The lungs are well aerated bilaterally. Stable vascular congestion is noted. No focal confluent infiltrate is seen. IMPRESSION: Stable vascular congestion without focal infiltrate. Electronically Signed   By: Inez Catalina M.D.   On: 08/07/2018 07:19   Dg Chest Port 1 View  Result Date: 08/06/2018 CLINICAL DATA:  Hypoxia EXAM: PORTABLE CHEST 1 VIEW COMPARISON:  08/05/2018 FINDINGS: Cardiac shadow is enlarged but stable. Aortic calcifications are again seen. Right lung is well aerated without focal infiltrate. Persistent vascular congestion is noted. Some increasing density in the left base is noted consistent with evolving infiltrate. No bony abnormality is seen.  IMPRESSION: Increasing left retrocardiac density consistent with evolving infiltrate. Mild vascular congestion remains. Electronically Signed   By: Inez Catalina M.D.   On: 08/06/2018 09:14      Subjective: Patient denies complaints and is anxious to go home.  She denies dyspnea, chest pain, dizziness, lightheadedness or palpitations.  No further diarrhea.  Lower extremity swelling progressively improving.   Discharge Exam:  Vitals:   08/17/18 0400 08/17/18 0757 08/17/18 0927 08/17/18 1240  BP: (!) 102/58 103/60 112/68 108/61  Pulse:  64  62  Resp:      Temp: 99.6 F (37.6 C) 98.6 F (37 C)  98.5 F (36.9 C)  TempSrc: Oral Oral    SpO2:  97%    Weight: 97.9 kg     Height:        General: Pleasant middle-aged female, moderately built and nourished, sitting up comfortably in bed. Lungs: Occasional left basal crackles but otherwise clear to auscultation.  No increased work of breathing. Cardiovascular: S1 and S2 heard, RRR.  No JVD or murmurs.  1+ pitting bilateral leg edema improving.  Has bilateral lower extremity compressive stockings.  Telemetry personally reviewed: SB in the 50s-SR.  Had transient A. fib with controlled ventricular rate. Abdomen: Nondistended, soft and nontender.  Normal bowel sounds heard.  No organomegaly or masses appreciated.   Extremities: Moves all limbs symmetrically and well. CNS: Alert and oriented.  No focal neurological deficits.    The results of significant diagnostics from this hospitalization (including imaging, microbiology, ancillary and laboratory) are listed below for reference.     Labs: CBC: Recent Labs  Lab 08/12/18 0510 08/13/18 0330 08/14/18 0510 08/15/18 0500 08/16/18 0456 08/17/18 0421  WBC 6.6 6.7 8.3 9.2 6.8 6.1  NEUTROABS 4.8 4.8 6.6  --   --   --   HGB 11.3* 11.5* 11.2* 10.5* 10.3* 10.5*  HCT 40.7 41.5 40.3 38.0 37.3 37.9  MCV 83.9 83.7 84.3 83.9 83.8 83.5  PLT 108* 104* 108* 113* 123* 174*   Basic Metabolic  Panel: Recent Labs  Lab 08/11/18 0501 08/12/18 0510 08/13/18 0330 08/14/18 0510 08/15/18 0500 08/16/18 0456 08/17/18 0421  NA 146* 143 141 141 141 139 140  K 3.7 3.8 3.8 3.6 3.1* 3.5 3.8  CL 94* 94* 92* 92* 94* 95* 94*  CO2 39* 41* 39* 40* 39* 37* 36*  GLUCOSE 87 86 85 92 84 83 76  BUN 37* 36* 34* 32* 25* 23 23  CREATININE 2.03* 1.73* 1.76* 1.69* 1.64* 1.65* 1.69*  CALCIUM 8.8* 8.6* 8.8* 8.9 8.6*  8.4* 8.6*  MG 1.9 1.8  --   --   --  2.2  --    Liver Function Tests: Recent Labs  Lab 08/13/18 0330 08/14/18 0510 08/15/18 0500 08/16/18 0456 08/17/18 0421  AST 13* 12* 12* 12* 12*  ALT 20 17 15 13 12   ALKPHOS 78 70 62 60 61  BILITOT 0.8 1.0 1.2 1.0 1.3*  PROT 6.1* 6.1* 5.5* 5.5* 5.7*  ALBUMIN 2.7* 2.6* 2.5* 2.4* 2.5*   BNP (last 3 results) Recent Labs    08/05/18 2155  BNP 3,951.4*   Urinalysis    Component Value Date/Time   COLORURINE YELLOW 08/06/2018 0841   APPEARANCEUR CLEAR 08/06/2018 0841   LABSPEC 1.005 08/06/2018 0841   PHURINE 6.0 08/06/2018 0841   GLUCOSEU NEGATIVE 08/06/2018 0841   GLUCOSEU NEGATIVE 03/23/2018 1603   HGBUR LARGE (A) 08/06/2018 0841   BILIRUBINUR NEGATIVE 08/06/2018 0841   KETONESUR NEGATIVE 08/06/2018 0841   PROTEINUR 30 (A) 08/06/2018 0841   UROBILINOGEN 0.2 03/23/2018 1603   NITRITE NEGATIVE 08/06/2018 0841   LEUKOCYTESUR NEGATIVE 08/06/2018 0841      Time coordinating discharge: Over 30 minutes  SIGNED:  Vernell Leep, MD, FACP, [facility]. [facility] Pager [phone]  If 7PM-7AM, please contact night-coverage [email or web address] Password [facility] 08/17/2018, 3:57 PM

## 2018-08-17 NOTE — Progress Notes (Signed)
Pt's BP was 84/58 upon routine vital assessment . Manually, the BP was 90/62. MD was notified and orders received to hold lasix and hydralazine temporarily.

## 2018-08-17 NOTE — Progress Notes (Addendum)
Advanced Heart Failure Rounding Note  PCP-Cardiologist: Glori Bickers, MD   Subjective:    Coox 77.6%. Cr 1.69. K 3.8. Negative 2.5 L and down 3 lbs. Remains 6 lbs up from lowest weight this admission.   Feeling OK this am. Remains swollen into thighs. Denies SOB and anxious to go home. CVP 6-7 cm  Bedside echo completed by Dr [name] 08/06/18 which showed EF 40-45%, but RV severely reduced.   Objective:   Weight Range: 97.9 kg Body mass index is 34.83 kg/m.   Vital Signs:   Temp:  [98.4 F (36.9 C)-99.6 F (37.6 C)] 98.6 F (37 C) (09/24 0757) Pulse Rate:  [56-64] 64 (09/24 0757) Resp:  [17-22] 17 (09/23 2350) BP: (84-132)/(56-72) 103/60 (09/24 0757) SpO2:  [92 %-97 %] 97 % (09/24 0757) Weight:  [97.9 kg] 97.9 kg (09/24 0400) Last BM Date: 08/16/18  Weight change: Filed Weights   08/15/18 0342 08/16/18 0550 08/17/18 0400  Weight: 97.3 kg 99.2 kg 97.9 kg   Intake/Output:  Intake/Output Summary (Last 24 hours) at 08/17/2018 0912 Last data filed at 08/17/2018 0400 Gross per 24 hour  Intake 552.12 ml  Output 3050 ml  Net -2497.88 ml    Physical Exam    General: NAD HEENT: Normal Neck: Supple. JVP ~6-7 cm. Carotids 2+ bilat; no bruits. No thyromegaly or nodule noted. Cor: PMI nondisplaced. RRR, No M/G/R noted Lungs: CTAB, normal effort. Abdomen: Soft, non-tender, non-distended, no HSM. No bruits or masses. +BS  Extremities: No cyanosis, clubbing, or rash. + ted hose with 1-2+ edema into thighs.  Neuro: Alert & orientedx3, cranial nerves grossly intact. moves all 4 extremities w/o difficulty. Affect pleasant   Telemetry   NSR/Sbrady 50-60s, personally reviewed.   EKG   No new tracings.    Labs    CBC Recent Labs    08/16/18 0456 08/17/18 0421  WBC 6.8 6.1  HGB 10.3* 10.5*  HCT 37.3 37.9  MCV 83.8 83.5  PLT 123* 742*   Basic Metabolic Panel Recent Labs    08/16/18 0456 08/17/18 0421  NA 139 140  K 3.5 3.8  CL 95* 94*  CO2 37* 36*    GLUCOSE 83 76  BUN 23 23  CREATININE 1.65* 1.69*  CALCIUM 8.4* 8.6*  MG 2.2  --    Liver Function Tests Recent Labs    08/16/18 0456 08/17/18 0421  AST 12* 12*  ALT 13 12  ALKPHOS 60 61  BILITOT 1.0 1.3*  PROT 5.5* 5.7*  ALBUMIN 2.4* 2.5*   No results for input(s): LIPASE, AMYLASE in the last 72 hours. Cardiac Enzymes No results for input(s): CKTOTAL, CKMB, CKMBINDEX, TROPONINI in the last 72 hours. BNP: BNP (last 3 results) Recent Labs    08/05/18 2155  BNP 3,951.4*    ProBNP (last 3 results) No results for input(s): PROBNP in the last 8760 hours.   D-Dimer No results for input(s): DDIMER in the last 72 hours. Hemoglobin A1C No results for input(s): HGBA1C in the last 72 hours. Fasting Lipid Panel No results for input(s): CHOL, HDL, LDLCALC, TRIG, CHOLHDL, LDLDIRECT in the last 72 hours. Thyroid Function Tests No results for input(s): TSH, T4TOTAL, T3FREE, THYROIDAB in the last 72 hours.  Invalid input(s): FREET3  Other results:  Imaging   No results found.  Medications:    Scheduled Medications: . amiodarone  400 mg Oral BID  . apixaban  5 mg Oral BID  . furosemide  80 mg Intravenous BID  . hydrALAZINE  12.5  mg Oral Q8H  . isosorbide mononitrate  15 mg Oral Daily  . potassium chloride  40 mEq Oral Daily  . ranolazine  500 mg Oral BID  . sodium chloride flush  3 mL Intravenous Q12H    Infusions: . sodium chloride 10 mL/hr at 08/14/18 1000    PRN Medications: sodium chloride, acetaminophen, fentaNYL (SUBLIMAZE) injection, loperamide, sodium chloride flush  Patient Profile   Patricia Wagner is a 67 y.o. female with a past medical history of paroxysmalatrial fibrillation, NICM cardiomyopathy, Biventricular CHF (LVEF 15% and RV severely reduced by TEE 03/25/17), HTN, hx of recurrent GIB, COPD, anemia, chronic foley catheter, and HLD.   Admitted with facial droop/slurred speech and hypoxia. Found to be in A/C hypoxic respiratory failure and  biventricular HF.   Assessment/Plan   1. A/C hypoxic respiratory failure in COPD - Intubated 9/13, extubated 08/07/18.  - Stable on RA.  - Weight down 23 lbs total with IV diuresis. Remains 6 lbs up from lowest weight this admission.   2. Facial droop/slurred speech - Head CT negative - MRI showed no acute changes.  - Urine culture with rare bacteria + casts.  - Alert and oriented now.   3. Acute on chronic biventricular CHF due to NICM (suspect AF cardiomyopathy)  - TEE 03/25/17 with LVEF 15% and severely reduced RV function.  - Echo 06/2017 EF 30-35% (up from 15% in setting of AF in 5/18).  - Echo 02/2018: EF 40-45%, grade 1 DD, RV normal - Amyloid w/u negative with negative fat pad biopsy and PYP scan  - Volume status remains elevated, though CVP 6-7 cm - Continue IV lasix 80 mg BID for today.  - Continue ted hose.  - Switch back to IV lasix for 80 mg BID today.  - Bedside echo 9/12, showed EF 40-45%, but RV severely reduced. Low suspicion for PE since she is on chronic AC.  - No BB with bradycardia.  -Continue hydral 12.5 mg TID and imdur 15 mg daily.  - No ACE/ARB with CKD.  4. Persistent Afib: Cannot tolerate AF with recurrent shock. S/p DCCV on 5/2 (she stopped Amio after this) and DCCV on 05/06/17 and again 05/12/17. - She absolutely cannot tolerate AF with recurrent cardiogenic shock when AF recurs. - Remains in NSR.  - Coox stable in NSR.  - Continue amiodarone 400 mg BID x 1 week, then 200 mg BID. She tolerates afib very poorly.  - Continue Ranexa carefully with CKD - Continue Eliquis 5 mg BID - EP follows outpatient. Holding off on ablation for now because she needs lumpectomy.  5. Suspected Sleep Apnea: - Refuses sleep study. No change.    6. AKI on CKD - Baseline 1.6-1.8.  - Cr up to 3.89 this admit, but now back down to baseline. Follow with diuresis.   7. HTN - BPs 90-110s. Titrate hydral as needed.   8. Anemia - Hgb 10.5. No bleeding.   9.  Hypomagnesiumia - Mag 2.2 08/16/18  10. Diarrhea/loose stools - Likely due to [place].  - Resolved off Mg and with prn imodium   Heart failure team will sign off as of 08/17/18  HF Medication Recommendations for Home: Lasix 80 mg daily. Hydralazine 12.5 mg TID Imdur 15 mg daily Eliquis 5 mg BID  Ranexa 500 mg BID Amiodarone 400 mg BID x 1 week, then 200 mg BID.  Potassium 40 meq daily.  Follow up as an outpatient placed in AVS.   Length of Stay: 12  Shirley Friar, PA-C  08/17/2018, 9:12 AM  Advanced Heart Failure Team Pager [phone] (M-F; 7a - 4p)  Please contact [facility] for night-coverage after hours (4p -7a ) and weekends on [email or web address]  Patient seen and examined with the above-signed Advanced Practice Provider and/or Housestaff. I personally reviewed laboratory data, imaging studies and relevant notes. I independently examined the patient and formulated the important aspects of the plan. I have edited the note to reflect any of my changes or salient points. I have personally discussed the plan with the patient and/or family.  Volume status much improved. Remains in NSR. Ok to for d/c home today with close f/u in HF Clinic. D/c meds as above. D/w Dr. Lavella Lemons by telephone as well. Will need eventual AF ablation. Discussed use of sliding scale diuretic regimen.   Glori Bickers, MD  8:13 PM

## 2018-08-17 NOTE — Progress Notes (Signed)
SATURATION QUALIFICATIONS: (This note is used to comply with regulatory documentation for home oxygen)  Patient Saturations on Room Air at Rest = 95%  Patient Saturations on Room Air while Ambulating = 61%  Patient Saturations on 3 Liters of oxygen while Ambulating = 95%  Please briefly explain why patient needs home oxygen: Decrease saturations on room air while ambulating

## 2018-08-17 NOTE — Discharge Instructions (Addendum)
Please get your medications reviewed and adjusted by your Primary MD. ° °Please request your Primary MD to go over all Hospital Tests and Procedure/Radiological results at the follow up, please get all Hospital records sent to your Prim MD by signing hospital release before you go home. ° °If you had Pneumonia of Lung problems at the Hospital: °Please get a 2 view Chest X ray done in 6-8 weeks after hospital discharge or sooner if instructed by your Primary MD. ° °If you have Congestive Heart Failure: °Please call your Cardiologist or Primary MD anytime you have any of the following symptoms:  °1) 3 pound weight gain in 24 hours or 5 pounds in 1 week  °2) shortness of breath, with or without a dry hacking cough  °3) swelling in the hands, feet or stomach  °4) if you have to sleep on extra pillows at night in order to breathe ° °Follow cardiac low salt diet and 1.5 lit/day fluid restriction. ° °If you have diabetes °Accuchecks 4 times/day, Once in AM empty stomach and then before each meal. °Log in all results and show them to your primary doctor at your next visit. °If any glucose reading is under 80 or above 300 call your primary MD immediately. ° °If you have Seizure/Convulsions/Epilepsy: °Please do not drive, operate heavy machinery, participate in activities at heights or participate in high speed sports until you have seen by Primary MD or a Neurologist and advised to do so again. ° °If you had Gastrointestinal Bleeding: °Please ask your Primary MD to check a complete blood count within one week of discharge or at your next visit. Your endoscopic/colonoscopic biopsies that are pending at the time of discharge, will also need to followed by your Primary MD. ° °Get Medicines reviewed and adjusted. °Please take all your medications with you for your next visit with your Primary MD ° °Please request your Primary MD to go over all hospital tests and procedure/radiological results at the follow up, please ask your  Primary MD to get all Hospital records sent to his/her office. ° °If you experience worsening of your admission symptoms, develop shortness of breath, life threatening emergency, suicidal or homicidal thoughts you must seek medical attention immediately by calling 911 or calling your MD immediately  if symptoms less severe. ° °You must read complete instructions/literature along with all the possible adverse reactions/side effects for all the Medicines you take and that have been prescribed to you. Take any new Medicines after you have completely understood and accpet all the possible adverse reactions/side effects.  ° °Do not drive or operate heavy machinery when taking Pain medications.  ° °Do not take more than prescribed Pain, Sleep and Anxiety Medications ° °Special Instructions: If you have smoked or chewed Tobacco  in the last 2 yrs please stop smoking, stop any regular Alcohol  and or any Recreational drug use. ° °Wear Seat belts while driving. ° °Please note °You were cared for by a hospitalist during your hospital stay. If you have any questions about your discharge medications or the care you received while you were in the hospital after you are discharged, you can call the unit and asked to speak with the hospitalist on call if the hospitalist that took care of you is not available. Once you are discharged, your primary care physician will handle any further medical issues. Please note that NO REFILLS for any discharge medications will be authorized once you are discharged, as it is imperative that you   return to your primary care physician (or establish a relationship with a primary care physician if you do not have one) for your aftercare needs so that they can reassess your need for medications and monitor your lab values.  You can reach the hospitalist office at phone [phone] or fax [phone]   If you do not have a primary care physician, you can call [phone] for a physician  referral.    Hypotension As your heart beats, it forces blood through your body. This force is called blood pressure. If you have hypotension, you have low blood pressure. When your blood pressure is too low, you may not get enough blood to your brain. You may feel weak, feel light-headed, have a fast heartbeat, or even pass out (faint). Follow these instructions at home: Eating and drinking  Drink enough fluids to keep your pee (urine) clear or pale yellow.  Eat a healthy diet, and follow instructions from your doctor about eating or drinking restrictions. A healthy diet includes: ? Fresh fruits and vegetables. ? Whole grains. ? Low-fat (lean) meats. ? Low-fat dairy products.  Eat extra salt only as told. Do not add extra salt to your diet unless your doctor tells you to.  Eat small meals often.  Avoid standing up quickly after you eat. Medicines  Take over-the-counter and prescription medicines only as told by your doctor. ? Follow instructions from your doctor about changing how much you take (the dosage) of your medicines, if this applies. ? Do not stop or change your medicine on your own. General instructions  Wear compression stockings as told by your doctor.  Get up slowly from lying down or sitting.  Avoid hot showers and a lot of heat as told by your doctor.  Return to your normal activities as told by your doctor. Ask what activities are safe for you.  Do not use any products that contain nicotine or tobacco, such as cigarettes and e-cigarettes. If you need help quitting, ask your doctor.  Keep all follow-up visits as told by your doctor. This is important. Contact a doctor if:  You throw up (vomit).  You have watery poop (diarrhea).  You have a fever for more than 2-3 days.  You feel more thirsty than normal.  You feel weak and tired. Get help right away if:  You have chest pain.  You have a fast or irregular heartbeat.  You lose feeling (get  numbness) in any part of your body.  You cannot move your arms or your legs.  You have trouble talking.  You get sweaty or feel light-headed.  You faint.  You have trouble breathing.  You have trouble staying awake.  You feel confused. This information is not intended to replace advice given to you by your health care provider. Make sure you discuss any questions you have with your health care provider. Document Released: 02/04/2010 Document Revised: 07/29/2016 Document Reviewed: 07/29/2016 Elsevier Interactive Patient Education  2017 [employer].   Heart Failure Heart failure means your heart has trouble pumping blood. This makes it hard for your body to work well. Heart failure is usually a long-term (chronic) condition. You must take good care of yourself and follow your doctor's treatment plan. Follow these instructions at home:  Take your heart medicine as told by your doctor. ? Do not stop taking medicine unless your doctor tells you to. ? Do not skip any dose of medicine. ? Refill your medicines before they run out. ? Take other  medicines only as told by your doctor or pharmacist.  Stay active if told by your doctor. The elderly and people with severe heart failure should talk with a doctor about physical activity.  Eat heart-healthy foods. Choose foods that are without trans fat and are low in saturated fat, cholesterol, and salt (sodium). This includes fresh or frozen fruits and vegetables, fish, lean meats, fat-free or low-fat dairy foods, whole grains, and high-fiber foods. Lentils and dried peas and beans (legumes) are also good choices.  Limit salt if told by your doctor.  Cook in a healthy way. Roast, grill, broil, bake, poach, steam, or stir-fry foods.  Limit fluids as told by your doctor.  Weigh yourself every morning. Do this after you pee (urinate) and before you eat breakfast. Write down your weight to give to your doctor.  Take your blood pressure and  write it down if your doctor tells you to.  Ask your doctor how to check your pulse. Check your pulse as told.  Lose weight if told by your doctor.  Stop smoking or chewing tobacco. Do not use gum or patches that help you quit without your doctor's approval.  Schedule and go to doctor visits as told.  Nonpregnant women should have no more than 1 drink a day. Men should have no more than 2 drinks a day. Talk to your doctor about drinking alcohol.  Stop illegal drug use.  Stay current with shots (immunizations).  Manage your health conditions as told by your doctor.  Learn to manage your stress.  Rest when you are tired.  If it is really hot outside: ? Avoid intense activities. ? Use air conditioning or fans, or get in a cooler place. ? Avoid caffeine and alcohol. ? Wear loose-fitting, lightweight, and light-colored clothing.  If it is really cold outside: ? Avoid intense activities. ? Layer your clothing. ? Wear mittens or gloves, a hat, and a scarf when going outside. ? Avoid alcohol.  Learn about heart failure and get support as needed.  Get help to maintain or improve your quality of life and your ability to care for yourself as needed. Contact a doctor if:  You gain weight quickly.  You are more short of breath than usual.  You cannot do your normal activities.  You tire easily.  You cough more than normal, especially with activity.  You have any or more puffiness (swelling) in areas such as your hands, feet, ankles, or belly (abdomen).  You cannot sleep because it is hard to breathe. You feel like your heart is beat Cooking With Less Salt Cooking with less salt is one way to reduce the amount of sodium you get from food. Depending on your condition and overall health, your health care provider or diet and nutrition specialist (dietitian) may recommend that you reduce your sodium intake. Most people should have less than 2,300 milligrams (mg) of sodium each  day. If you have high blood pressure (hypertension), you may need to limit your sodium to 1,500 mg each day. Follow the tips below to help reduce your sodium intake. What do I need to know about cooking with less salt? Shopping Buy sodium-free or low-sodium products. Look for the following words on food labels: Low-sodium. Sodium-free. Reduced-sodium. No salt added. Unsalted. Buy fresh or frozen vegetables. Avoid canned vegetables. Avoid buying meats or protein foods that have been injected with broth or saline solution. Avoid cured or smoked meats, such as hot dogs, bacon, salami, ham, and bologna. Reading  food labels Check the food label before buying or using packaged ingredients. Look for products with no more than 140 mg of sodium in one serving. Do not choose foods with salt as one of the first three ingredients on the ingredients list. If salt is one of the first three ingredients, it usually means the item is high in sodium, because ingredients are listed in order of amount in the food item. Cooking Use herbs, seasonings without salt, and spices as substitutes for salt in foods. Use sodium-free baking soda when baking. Grill, braise, or roast foods to add flavor with less salt. Avoid adding salt to pasta, rice, or hot cereals while cooking. Drain and rinse canned vegetables before use. Avoid adding salt when cooking sweets and desserts. Cook with low-sodium ingredients. What are some salt alternatives? The following are herbs, seasonings, and spices that can be used instead of salt to give taste to your food. Herbs should be fresh or dried. Do not choose packaged mixes. Next to the name of the herb, spice, or seasoning are some examples of foods you can pair it with. Herbs [place] leaves - Soups, meat and vegetable dishes, and spaghetti sauce. Basil - [employer], soups, pasta, and fish dishes. Cilantro - Meat, poultry, and vegetable dishes. Chili powder - Marinades and Mexican  dishes. Chives - Salad dressings and potato dishes. Cumin - Mexican dishes, couscous, and meat dishes. Dill - Fish dishes, sauces, and salads. Fennel - Meat and vegetable dishes, breads, and cookies. Garlic (do not use garlic salt) - [place] dishes, meat dishes, salad dressings, and sauces. Marjoram - Soups, potato dishes, and meat dishes. Oregano - Pizza and spaghetti sauce. Parsley - Salads, soups, pasta, and meat dishes. Rosemary - [place] dishes, salad dressings, soups, and red meats. Saffron - Fish dishes, pasta, and some poultry dishes. Sage - Stuffings and sauces. Tarragon - Fish and [employer]. Thyme - Stuffing, meat, and fish dishes. Seasonings Lemon juice - Fish dishes, poultry dishes, vegetables, and salads. Vinegar - Salad dressings, vegetables, and fish dishes. Spices Cinnamon - Sweet dishes, such as cakes, cookies, and puddings. Cloves - Gingerbread, puddings, and marinades for meats. Curry - Vegetable dishes, fish and poultry dishes, and stir-fry dishes. Ginger - Vegetables dishes, fish dishes, and stir-fry dishes. Nutmeg - Pasta, vegetables, poultry, fish dishes, and custard. What are some low-sodium ingredients and foods? Fresh or frozen fruits and vegetables with no sauce added. Fresh or frozen whole meats, poultry, and fish with no sauce added. Eggs. Noodles, pasta, quinoa, rice. Shredded or puffed wheat or puffed rice. Regular or quick oats. Milk, yogurt, hard cheeses, and low-sodium cheeses. Good cheese choices include Swiss, [place]. Always check the label for the serving size and sodium content. Unsalted butter or margarine. Unsalted nuts. Sherbet or ice cream (keep to  cup per serving). Homemade pudding. Sodium-free baking soda and baking powder. This is not a complete list of low-sodium ingredients and foods. Contact your dietitian for more options. Summary Cooking with less salt is one way to reduce the amount of sodium that  you get from food. Buy sodium-free or low-sodium products. Check the food label before using or buying packaged ingredients. Use herbs, seasonings without salt, and spices as substitutes for salt in foods. This information is not intended to replace advice given to you by your health care provider. Make sure you discuss any questions you have with your health care provider. Document Released: 11/10/2005 Document Revised: 11/18/2016 Document Reviewed: 11/18/2016 Elsevier Interactive Patient  Education  2017 [employer].  ing fast (palpitations).  You get dizzy or light-headed when you stand up. Get help right away if:  You have trouble breathing.  There is a change in mental status, such as becoming less alert or not being able to focus.  You have chest pain or discomfort.  You faint. This information is not intended to replace advice given to you by your health care provider. Make sure you discuss any questions you have with your health care provider. Document Released: 08/19/2008 Document Revised: 04/17/2016 Document Reviewed: 12/27/2012 Elsevier Interactive Patient Education  2017 [employer].

## 2018-08-17 NOTE — Progress Notes (Signed)
Spoke to [employer], she will remove the central line. [employer]

## 2018-08-17 NOTE — Progress Notes (Signed)
Patient refused home O2, patient educated on the importance of oxygen use.   After speaking with Dr Algis Liming, patient agreed and stated she would take it but "it would just sit in the corner" because she "doesn't need no oxygen".  Foley leg bag applied.

## 2018-08-17 NOTE — Care Management Important Message (Signed)
Important Message  Patient Details  Name: Patricia Wagner MRN: [id number] Date of Birth: [birth year]   Medicare Important Message Given:  Yes    Barb Merino [place] 08/17/2018, 3:35 PM

## 2018-08-17 NOTE — Care Management Note (Signed)
Case Management Note  Patient Details  Name: Patricia Wagner MRN: [id number] Date of Birth: July 22, 1951  Subjective/Objective: Pt presented for Acute on Chronic CHF- PTA from home alone. Has support of daughter. Plan for patient to return home with [place]- pt is agreeable to [place] and DME Oxygen and Rolling Walker with [employer].                   Action/Plan: Agency List provided to patient and she chose [facility]- Referral called to [facility] and [facility] to begin within 24-48 hours post transition home. DME to be delivered to room once stable. No further needs from CM at this time.   Expected Discharge Date:  08/17/18               Expected Discharge Plan:  [place]  In-House Referral:  Clinical Social Work  Discharge planning Services  CM Consult  Post Acute Care Choice:  Durable Medical Equipment, Home Health Choice offered to:  Patient  DME Arranged:  Oxygen, Walker rolling DME Agency:  [place]:  PT [place]:  [place]  Status of Service:  Completed, signed off  If discussed at [place] of Stay Meetings, dates discussed:    Additional Comments:  Bethena Roys, RN 08/17/2018, 4:30 PM

## 2018-08-17 NOTE — Progress Notes (Signed)
Pt's BP's are continuing to run soft. 10pm and 6am hydralazine were held.  Morning dose of lasix is due at 8am. Please address parameters for holding BP medications.

## 2018-08-17 NOTE — Progress Notes (Signed)
Spoke with Janett Billow, RN regarding 2nd order to dc central line. She stated Hongalgi, MD must have entered a 2nd order, but the line was dc'd earlier per order written by Henry, [place].

## 2018-08-18 ENCOUNTER — Ambulatory Visit (HOSPITAL_COMMUNITY): Admission: RE | Admit: 2018-08-18 | Payer: Medicare HMO | Source: Ambulatory Visit | Admitting: Surgery

## 2018-08-18 ENCOUNTER — Telehealth: Payer: Self-pay | Admitting: *Deleted

## 2018-08-18 ENCOUNTER — Encounter (HOSPITAL_COMMUNITY): Admission: RE | Payer: Self-pay | Source: Ambulatory Visit

## 2018-08-18 SURGERY — BREAST LUMPECTOMY WITH RADIOACTIVE SEED LOCALIZATION
Anesthesia: General | Site: Breast | Laterality: Left

## 2018-08-18 NOTE — Telephone Encounter (Signed)
Tried calling pt to schedule hosp f/u appt no answer LMOM RTC. Sent CRM to [facility] for FYI.Marland KitchenJohny Chess

## 2018-08-19 ENCOUNTER — Encounter (HOSPITAL_COMMUNITY): Payer: Medicare HMO

## 2018-08-19 NOTE — Telephone Encounter (Signed)
Called pt again still no answer LMOM for pt to call and schedule hosp f/u appt. Per chart pt has made hosp f/u appty w/ [facility] 08/24/18.Marland KitchenJohny Chess

## 2018-08-23 ENCOUNTER — Encounter (HOSPITAL_COMMUNITY): Payer: Self-pay

## 2018-08-23 ENCOUNTER — Inpatient Hospital Stay (HOSPITAL_COMMUNITY)
Admit: 2018-08-23 | Discharge: 2018-08-23 | Disposition: A | Discharge status: Home / Self Care | Payer: Medicare HMO | Attending: Nephrology | Admitting: Nephrology

## 2018-08-24 ENCOUNTER — Encounter (HOSPITAL_COMMUNITY): Payer: Medicare HMO

## 2018-08-24 ENCOUNTER — Ambulatory Visit (HOSPITAL_COMMUNITY)
Admit: 2018-08-24 | Discharge: 2018-08-24 | Disposition: A | Discharge status: Home / Self Care | Payer: Medicare HMO | Source: Ambulatory Visit | Attending: Cardiology | Admitting: Cardiology

## 2018-08-24 ENCOUNTER — Encounter: Payer: Medicare HMO | Admitting: *Deleted

## 2018-08-24 VITALS — BP 140/84 | HR 72 | Resp 20 | Wt 215.6 lb

## 2018-08-24 VITALS — BP 140/84 | HR 72 | Wt 215.6 lb

## 2018-08-24 DIAGNOSIS — M1990 Unspecified osteoarthritis, unspecified site: Secondary | ICD-10-CM | POA: Insufficient documentation

## 2018-08-24 DIAGNOSIS — I2510 Atherosclerotic heart disease of native coronary artery without angina pectoris: Secondary | ICD-10-CM | POA: Insufficient documentation

## 2018-08-24 DIAGNOSIS — N183 Chronic kidney disease, stage 3 unspecified: Secondary | ICD-10-CM

## 2018-08-24 DIAGNOSIS — I5082 Biventricular heart failure: Secondary | ICD-10-CM | POA: Diagnosis not present

## 2018-08-24 DIAGNOSIS — I10 Essential (primary) hypertension: Secondary | ICD-10-CM

## 2018-08-24 DIAGNOSIS — Z888 Allergy status to other drugs, medicaments and biological substances status: Secondary | ICD-10-CM | POA: Insufficient documentation

## 2018-08-24 DIAGNOSIS — E559 Vitamin D deficiency, unspecified: Secondary | ICD-10-CM | POA: Diagnosis not present

## 2018-08-24 DIAGNOSIS — I130 Hypertensive heart and chronic kidney disease with heart failure and stage 1 through stage 4 chronic kidney disease, or unspecified chronic kidney disease: Secondary | ICD-10-CM | POA: Diagnosis not present

## 2018-08-24 DIAGNOSIS — I428 Other cardiomyopathies: Secondary | ICD-10-CM

## 2018-08-24 DIAGNOSIS — I480 Paroxysmal atrial fibrillation: Secondary | ICD-10-CM

## 2018-08-24 DIAGNOSIS — D649 Anemia, unspecified: Secondary | ICD-10-CM | POA: Insufficient documentation

## 2018-08-24 DIAGNOSIS — Z87440 Personal history of urinary (tract) infections: Secondary | ICD-10-CM | POA: Diagnosis not present

## 2018-08-24 DIAGNOSIS — Z833 Family history of diabetes mellitus: Secondary | ICD-10-CM | POA: Insufficient documentation

## 2018-08-24 DIAGNOSIS — I5022 Chronic systolic (congestive) heart failure: Secondary | ICD-10-CM | POA: Diagnosis not present

## 2018-08-24 DIAGNOSIS — J449 Chronic obstructive pulmonary disease, unspecified: Secondary | ICD-10-CM | POA: Insufficient documentation

## 2018-08-24 DIAGNOSIS — Z825 Family history of asthma and other chronic lower respiratory diseases: Secondary | ICD-10-CM | POA: Insufficient documentation

## 2018-08-24 DIAGNOSIS — Z006 Encounter for examination for normal comparison and control in clinical research program: Secondary | ICD-10-CM

## 2018-08-24 DIAGNOSIS — Z8673 Personal history of transient ischemic attack (TIA), and cerebral infarction without residual deficits: Secondary | ICD-10-CM | POA: Diagnosis not present

## 2018-08-24 DIAGNOSIS — Z79899 Other long term (current) drug therapy: Secondary | ICD-10-CM | POA: Diagnosis not present

## 2018-08-24 DIAGNOSIS — Z7901 Long term (current) use of anticoagulants: Secondary | ICD-10-CM | POA: Diagnosis not present

## 2018-08-24 DIAGNOSIS — Z87891 Personal history of nicotine dependence: Secondary | ICD-10-CM | POA: Insufficient documentation

## 2018-08-24 DIAGNOSIS — Z8249 Family history of ischemic heart disease and other diseases of the circulatory system: Secondary | ICD-10-CM | POA: Insufficient documentation

## 2018-08-24 DIAGNOSIS — E785 Hyperlipidemia, unspecified: Secondary | ICD-10-CM | POA: Diagnosis not present

## 2018-08-24 LAB — BASIC METABOLIC PANEL
Anion gap: 11 (ref 5–15)
BUN: 24 mg/dL — ABNORMAL HIGH (ref 8–23)
CALCIUM: 8.9 mg/dL (ref 8.9–10.3)
CO2: 28 mmol/L (ref 22–32)
CREATININE: 2.15 mg/dL — AB (ref 0.44–1.00)
Chloride: 101 mmol/L (ref 98–111)
GFR calc Af Amer: 27 mL/min — ABNORMAL LOW (ref >60)
GFR, EST NON AFRICAN AMERICAN: 23 mL/min — AB (ref >60)
GLUCOSE: 84 mg/dL (ref 70–99)
Potassium: 3.9 mmol/L (ref 3.5–5.1)
Sodium: 140 mmol/L (ref 135–145)

## 2018-08-24 MED ORDER — FUROSEMIDE 80 MG PO TABS: ORAL_TABLET | ORAL | 6 refills | Status: DC

## 2018-08-24 NOTE — Addendum Note (Signed)
Encounter addended by: Conrad Wayland, NP on: 08/24/2018 2:11 PM  Actions taken: Visit diagnoses modified, LOS modified

## 2018-08-24 NOTE — Patient Instructions (Signed)
INCREASE Lasix to 80 mg twice a day for two days, then resume normal dose of 80 mg once daily   Labs today We will only contact you if something comes back abnormal or we need to make some changes. Otherwise no news is good news!  Your physician recommends that you schedule a follow-up appointment in: 4-6 weeks  in the Advanced Practitioners (PA/NP) Clinic    Do the following things EVERYDAY: 1) Weigh yourself in the morning before breakfast. Write it down and keep it in a log. 2) Take your medicines as prescribed 3) Eat low salt foods-Limit salt (sodium) to 2000 mg per day.  4) Stay as active as you can everyday 5) Limit all fluids for the day to less than 2 liters

## 2018-08-24 NOTE — Progress Notes (Signed)
Advanced Heart Failure Clinic Note   PCP: Dr Wille Glaser  Primary Cardiologist: Dr. [name] Primary HF: [name] Urologist:  Nephrology: Dr Justin Mend EP; Dr Curt Bears  HPI: Patricia Wagner is a 67 y.o. female with a past medical history of paroxysmal atrial fibrillation, NICM cardiomyopathy, Biventricular CHF, HTN, chronic foley due to urinary retentin, CKD Stage III, and HLD.   She has struggled with symptomatic afib since 10/2016. Had successful DCCV 10/2016. Failed DCCV 03/2017, after stopping her amiodarone. Seen in A-Fib clinic 04/09/17 and planned for repeat DCCV after further amio load.   Pt admitted to [facility] 6/6 - 05/16/17 with cardiogenic shock in the setting of persistent AF. Had tach CM with EF 15% with severe biventricular dysfunction. Started on amio gtt and PICC line placed for dobutamine with low output HF. Underwent DCCV 05/06/17 but went back into Afib 05/08/17 and developed recurrent cardiogenic shock, requiring re-initiation of dobutamine and amio. Pt had repeat DCCV 05/12/17 with successful conversion to NSR. Discharged on amio and Ranexa.  Last echocardiogram August 2018 showed ejection fraction 30-35%,severe left ventricular hypertrophy, mild to moderate mitral regurgitation, biatrial enlargement and moderately reduced RV function. Fat pad bx negative. PYP 10/18 felt equivocal but not favored for amyloid. Patient has been scheduled for atrial fibrillation ablation January 2019 as she has had cardiogenic shock with atrial fibrillation previously.   Admitted 2/5 through 01/08/2018 with UTI and AKI. Found to have neurogenic bladder with urinary retention .Creatinine peaked at 7. Lasix, spiro, and entresto stopped. Required home IV antibiotics and discharged with foley. At the time of discharge creatinine was down to 1.07.   Admitted 03/05/18 and again on 4/22 with lower GI bleed. Initially was in setting of INR > 10, but transitioned from coumadin to Xarelto after first admission. Pt had  colonoscopy in 11/2017 which showed diverticular disease, so no repeat performed due to supratherapeutic INR. GI consulted. Tagged NM scan 03/15/18 with no active bleeding. Left off of AC for now.   Amyloid work up negative. Fat pad biopsy was negative. PYP scan negative.   Admitted 9/12 through 08/17/18. Hospital course complicated by acute hypoxic respiratory failure and a/c systolic/diastolic heart failure. Required short term intubation and diuresis with IV lasix. Discharged amiodarone taper. Discharge weight  was 215.8 pounds.   Today she returns for post hospital follow up. Overall feeling fine. Denies SOB/PND/Orthopnea. Continues to walk with cane. Appetite ok. No fever or chills. Weight at home 215 pounds. Having increased leg edema. She has chronic foley catheter. Taking all medications.   ECHO 03/06/2018  Left ventricle: The cavity size was normal. There was severe   concentric hypertrophy. Systolic function was mildly to   moderately reduced. The estimated ejection fraction was in the   range of 40% to 45%. Mild diffuse hypokinesis with no   identifiable regional variations. Doppler parameters are   consistent with abnormal left ventricular relaxation (grade 1   diastolic dysfunction). - Aortic valve: There was mild stenosis. Valve area (VTI): 1.5   cm^2. Valve area (Vmax): 1.59 cm^2. Valve area (Vmean): 1.4 cm^2. - Mitral valve: Calcified annulus. - Left atrium: The atrium was severely dilated. - Right atrium: The atrium was moderately dilated. - Pericardium, extracardiac: There was a left pleural effusion.   Echo from 07/15/17 EF 30-35% Severe LVH. RV down.   Review of systems complete and found to be negative unless listed in HPI.   Past Medical History:  Diagnosis Date  . Acute blood loss anemia 03/05/2018  . Acute  hypercapnic respiratory failure ([place]) 04/30/2017  . Acute renal failure superimposed on chronic kidney disease ([place]) 10/20/2016  . Arthritis   . Atrial  fibrillation ([place])    a. s/p multiple cardioversions; failed tikosyn/sotalol.  . Bradycardia 11/20/2016  . CAD in native artery, s/p cardiac cath with non obstructive CAD 10/24/2016  . Cerebrovascular accident (CVA) due to embolism of left cerebellar artery ([place])   . CHF (congestive heart failure) ([place])   . Chronic anticoagulation 03/15/2018  . Chronic atrial fibrillation ([place]) 03/05/2018  . Chronic systolic (congestive) heart failure ([place]) 03/05/2018  . CKD (chronic kidney disease) stage 3, GFR 30-59 ml/min (HCC)   . Coagulopathy ([place]) 03/05/2018  . DIVERTICULITIS, HX OF 07/25/2007  . Diverticulosis of colon with hemorrhage 07/22/2007   Qualifier: Diagnosis of  By: [name]    . DIVERTICULOSIS, COLON 07/22/2007  . Dyspnea   . Dysuria 07/21/2017  . Edema, peripheral    a. chronic BLE edema, R>L. Prior trauma from dog attack and accident.  . Essential hypertension 07/22/2007   Qualifier: Diagnosis of  By: [name]    . HLD (hyperlipidemia) 02/03/2008   Qualifier: Diagnosis of  By: [name], [name]   . HYPERLIPIDEMIA 02/03/2008  . Hypersomnia    declines w/u  . Hypertension   . Hypotension (arterial) 04/30/2017  . MENOPAUSAL DISORDER 01/09/2011  . Morbid obesity ([place]) 07/22/2007  . NICM (nonischemic cardiomyopathy) ([place]) 10/24/2016  . PAF (paroxysmal atrial fibrillation) ([place])   . Raynaud's syndrome 07/22/2007  . Stroke ([place]) 2017  . Suspected sleep apnea 05/20/2017  . THYROID NODULE, RIGHT 01/04/2010  . VITAMIN D DEFICIENCY 01/09/2011   Qualifier: Diagnosis of  By: [name], [name]     Current Outpatient Medications  Medication Sig Dispense Refill  . amiodarone (PACERONE) 200 MG tablet 400 mg (2 tabs) two times daily for 1 week and then 200 mg (1 tab) two times daily thereafter. 60 tablet 0  . apixaban (ELIQUIS) 5 MG TABS tablet Take 1 tablet (5 mg total) by mouth 2 (two) times daily. 60 tablet 11  . epoetin alfa (EPOGEN,PROCRIT) 38756 UNIT/ML injection Inject 5,000 Units into  the skin once a week.    . furosemide (LASIX) 80 MG tablet Take 1 tablet (80 mg total) by mouth daily. 30 tablet 0  . hydrALAZINE (APRESOLINE) 25 MG tablet Take 0.5 tablets (12.5 mg total) by mouth 3 (three) times daily. 45 tablet 0  . isosorbide mononitrate (IMDUR) 30 MG 24 hr tablet Take 0.5 tablets (15 mg total) by mouth daily. 15 tablet 0  . potassium chloride SA (K-DUR,KLOR-CON) 20 MEQ tablet Take 2 tablets (40 mEq total) by mouth daily. 60 tablet 0  . ranolazine (RANEXA) 500 MG 12 hr tablet TAKE 1 TABLET BY MOUTH TWICE A DAY (Patient taking differently: Take 500 mg by mouth 2 (two) times daily. ) 60 tablet 5  . rosuvastatin (CRESTOR) 10 MG tablet Take 10 mg by mouth daily.     No current facility-administered medications for this encounter.     Allergies  Allergen Reactions  . Ace Inhibitors Palpitations     Social History   Socioeconomic History  . Marital status: Single    Spouse name: Not on file  . Number of children: 1  . Years of education: Not on file  . Highest education level: Not on file  Occupational History  . Occupation: Engineer, manufacturing  . Financial resource strain: Not on file  . Food insecurity:  Worry: Not on file    Inability: Not on file  . Transportation needs:    Medical: Not on file    Non-medical: Not on file  Tobacco Use  . Smoking status: Former Smoker    Packs/day: 0.50    Types: Cigarettes    Start date: 2019  . Smokeless tobacco: Never Used  . Tobacco comment: restarted back this year  Substance and Sexual Activity  . Alcohol use: No    Alcohol/week: 4.0 standard drinks    Types: 4 Cans of beer per week    Frequency: Never    Comment: Intermittent  . Drug use: No  . Sexual activity: Not on file  Lifestyle  . Physical activity:    Days per week: Not on file    Minutes per session: Not on file  . Stress: Not on file  Relationships  . Social connections:    Talks on phone: Not on file    Gets together: Not on file     Attends religious service: Not on file    Active member of club or organization: Not on file    Attends meetings of clubs or organizations: Not on file    Relationship status: Not on file  . Intimate partner violence:    Fear of current or ex partner: Not on file    Emotionally abused: Not on file    Physically abused: Not on file    Forced sexual activity: Not on file  Other Topics Concern  . Not on file  Social History Narrative  . Not on file     Family History  Problem Relation Age of Onset  . Asthma Mother   . Diabetes Father   . Heart disease Father        Died of presumed heart attack - 51s  . Lung disease Sister   . Heart disease Sister        Twin sister has heart issue, unclear what kind  . Thyroid disease Neg Hx     Vitals:   08/24/18 1213  BP: 140/84  Pulse: 72  SpO2: 91%  Weight: 97.8 kg (215 lb 9.6 oz)    Wt Readings from Last 3 Encounters:  08/24/18 97.8 kg (215 lb 9.6 oz)  08/17/18 97.9 kg (215 lb 12.8 oz)  08/10/18 108.7 kg (239 lb 10.2 oz)    PHYSICAL EXAM: General:   No resp difficulty. Ambulated in the clinic with a cane.  HEENT: normal Neck: supple. JVP ~10. Carotids 2+ bilat; no bruits. No lymphadenopathy or thryomegaly appreciated. Cor: PMI nondisplaced. Regular rate & rhythm. No rubs, gallops or murmurs. Lungs: clear Abdomen: soft, nontender, nondistended. No hepatosplenomegaly. No bruits or masses. Good bowel sounds. Extremities: no cyanosis, clubbing, rash, R and LLE 2+ edema Neuro: alert & orientedx3, cranial nerves grossly intact. moves all 4 extremities w/o difficulty. Affect pleasant  ASSESSMENT & PLAN:  1. Chronic biventricular CHF due to NICM (suspect AF cardiomyopathy) - Echo 02/2018: EF 40-45%, grade 1 DD, RV normal -- Amyloid w/u negative with negative fat pad biopsy and PYP scan  - NYHA II. Volume status elevated. Increased lasix to 80 mg twice a day for 2 days then back to 80 mg daily.  - Continue hydralazine 12.5 mg twice a  day for 3 days.  - Continue imdur 15 mg dailyl - She is unable to afford Bidil - Off spiro and Entresto with recent AKI.   2. Recurrent GI bleed - Tagged NM scan 03/15/18 negative -  Note per Dr. Hilarie Fredrickson 03/16/18 states likely Diverticular source.  - No bleeding problems.  - Continue eliquis 5 mg BID.   3. Persistent Afib: Cannot tolerate AF with recurrent shock. S/p DCCV on 5/2 (she stopped Amio after this) and DCCV on 05/06/17 and again 05/12/17. - She absolutely cannot tolerate AF with recurrent cardiogenic shock when AF recurs. - Continue Ranexa 500 mg BID.  - Continue amiodarone 400 mg twice a day tomorrow she will cut back amiodarone to 200 mg twice a day.  - CHA2DS2-VASc is at least 5 (CHF, HTN, Stroke, Female), she will gain another point for age in August.  - Continue Eliquis 5 mg BID.   4. Suspected Sleep Apnea: - Refuses sleep study. No change.   5.  COPD:  - No wheezing.  6. Recent AKI in the setting of urinary retention and UTI - Creatinine peaked at 7 during recent hospitalization in February. Creatinine 1.59 03/2018 - Avoid NSAIDs.  - has chronic foley catheter.  -Check BMET today  7. HTN Ok.   8. Anemia -Hgb 10.5, stable.   Follow up 4-6 weeks. Check BMET today.    Darrick Grinder, NP 08/24/18

## 2018-08-25 ENCOUNTER — Telehealth (HOSPITAL_COMMUNITY): Payer: Self-pay | Admitting: Cardiology

## 2018-08-25 DIAGNOSIS — R339 Retention of urine, unspecified: Secondary | ICD-10-CM | POA: Diagnosis not present

## 2018-08-25 DIAGNOSIS — I5022 Chronic systolic (congestive) heart failure: Secondary | ICD-10-CM

## 2018-08-25 NOTE — Telephone Encounter (Signed)
-----   Message from Conrad Cherry, NP sent at 08/24/2018  3:53 PM EDT ----- Creatinine trending up. Plan to repeat BMEt on Monday. Please call.

## 2018-08-25 NOTE — Telephone Encounter (Signed)
Notes recorded by Kerry Dory, CMA on 08/25/2018 at 11:12 AM EDT Patient aware. Patient voiced understanding, repeat labs will be done at PCP appt 10/7.order placed   ------  Notes recorded by Darrick Grinder D, NP on 08/24/2018 at 3:53 PM EDT Creatinine trending up. Plan to repeat BMEt on Monday. Please call.

## 2018-08-26 ENCOUNTER — Encounter (HOSPITAL_COMMUNITY): Payer: Medicare HMO

## 2018-08-30 ENCOUNTER — Encounter (HOSPITAL_COMMUNITY): Payer: Medicare HMO

## 2018-08-30 ENCOUNTER — Ambulatory Visit: Payer: Self-pay | Admitting: Pharmacist

## 2018-08-30 ENCOUNTER — Other Ambulatory Visit (INDEPENDENT_AMBULATORY_CARE_PROVIDER_SITE_OTHER): Payer: Medicare HMO

## 2018-08-30 ENCOUNTER — Ambulatory Visit (INDEPENDENT_AMBULATORY_CARE_PROVIDER_SITE_OTHER): Payer: Medicare HMO | Admitting: Internal Medicine

## 2018-08-30 ENCOUNTER — Encounter: Payer: Self-pay | Admitting: Internal Medicine

## 2018-08-30 VITALS — BP 118/76 | HR 72 | Temp 98.4°F | Ht 66.0 in | Wt 203.0 lb

## 2018-08-30 DIAGNOSIS — E785 Hyperlipidemia, unspecified: Secondary | ICD-10-CM

## 2018-08-30 DIAGNOSIS — D62 Acute posthemorrhagic anemia: Secondary | ICD-10-CM

## 2018-08-30 DIAGNOSIS — Z23 Encounter for immunization: Secondary | ICD-10-CM

## 2018-08-30 DIAGNOSIS — I10 Essential (primary) hypertension: Secondary | ICD-10-CM

## 2018-08-30 DIAGNOSIS — R8290 Unspecified abnormal findings in urine: Secondary | ICD-10-CM

## 2018-08-30 LAB — HEPATIC FUNCTION PANEL
ALBUMIN: 3.8 g/dL (ref 3.5–5.2)
ALT: 9 U/L (ref 0–35)
AST: 12 U/L (ref 0–37)
Alkaline Phosphatase: 71 U/L (ref 39–117)
BILIRUBIN TOTAL: 1.4 mg/dL — AB (ref 0.2–1.2)
Bilirubin, Direct: 0.4 mg/dL — ABNORMAL HIGH (ref 0.0–0.3)
Total Protein: 7.2 g/dL (ref 6.0–8.3)

## 2018-08-30 LAB — BASIC METABOLIC PANEL
BUN: 26 mg/dL — AB (ref 6–23)
CALCIUM: 9.3 mg/dL (ref 8.4–10.5)
CO2: 36 mEq/L — ABNORMAL HIGH (ref 19–32)
Chloride: 95 mEq/L — ABNORMAL LOW (ref 96–112)
Creatinine, Ser: 1.79 mg/dL — ABNORMAL HIGH (ref 0.40–1.20)
GFR: 36.54 mL/min — AB (ref >60.00)
GLUCOSE: 95 mg/dL (ref 70–99)
Potassium: 4.1 mEq/L (ref 3.5–5.1)
SODIUM: 138 meq/L (ref 135–145)

## 2018-08-30 LAB — CBC WITH DIFFERENTIAL/PLATELET
Basophils Absolute: 0.1 10*3/uL (ref 0.0–0.1)
Basophils Relative: 0.8 % (ref 0.0–3.0)
EOS ABS: 0.2 10*3/uL (ref 0.0–0.7)
Eosinophils Relative: 3.7 % (ref 0.0–5.0)
HCT: 40.5 % (ref 36.0–46.0)
HEMOGLOBIN: 12.5 g/dL (ref 12.0–15.0)
Lymphocytes Relative: 17.5 % (ref 12.0–46.0)
Lymphs Abs: 1.1 10*3/uL (ref 0.7–4.0)
MCHC: 30.9 g/dL (ref 30.0–36.0)
MCV: 80.1 fl (ref 78.0–100.0)
MONO ABS: 0.4 10*3/uL (ref 0.1–1.0)
Monocytes Relative: 5.7 % (ref 3.0–12.0)
Neutro Abs: 4.6 10*3/uL (ref 1.4–7.7)
Neutrophils Relative %: 72.3 % (ref 43.0–77.0)
Platelets: 243 10*3/uL (ref 150.0–400.0)
RBC: 5.05 Mil/uL (ref 3.87–5.11)
RDW: 26.4 % — ABNORMAL HIGH (ref 11.5–15.5)
WBC: 6.4 10*3/uL (ref 4.0–10.5)

## 2018-08-30 NOTE — Progress Notes (Signed)
Subjective:    Patient ID: Patricia Wagner, female    DOB: [birth year], 67 y.o.   MRN: [id number]  HPI  Here to f/u; overall doing ok,  Pt denies chest pain, increasing sob or doe, wheezing, orthopnea, PND, increased LE swelling, palpitations, dizziness or syncope, overall stable it seems after 21 lb wt loss with diuresis at recent hospn. Has seen cardiology.  Pt denies new neurological symptoms such as new headache, or facial or extremity weakness or numbness.  Pt denies polydipsia, polyuria, or low sugar episode.  Pt states overall good compliance with meds.  Denies urinary symptoms such as dysuria, frequency, urgency, flank pain, hematuria or n/v, fever, chills. Past Medical History:  Diagnosis Date  . Acute blood loss anemia 03/05/2018  . Acute hypercapnic respiratory failure ([place]) 04/30/2017  . Acute renal failure superimposed on chronic kidney disease ([place]) 10/20/2016  . Arthritis   . Atrial fibrillation ([place])    a. s/p multiple cardioversions; failed tikosyn/sotalol.  . Bradycardia 11/20/2016  . CAD in native artery, s/p cardiac cath with non obstructive CAD 10/24/2016  . Cerebrovascular accident (CVA) due to embolism of left cerebellar artery ([place])   . CHF (congestive heart failure) ([place])   . Chronic anticoagulation 03/15/2018  . Chronic atrial fibrillation 03/05/2018  . Chronic systolic (congestive) heart failure ([place]) 03/05/2018  . CKD (chronic kidney disease) stage 3, GFR 30-59 ml/min (HCC)   . Coagulopathy ([place]) 03/05/2018  . DIVERTICULITIS, HX OF 07/25/2007  . Diverticulosis of colon with hemorrhage 07/22/2007   Qualifier: Diagnosis of  By: [name]    . DIVERTICULOSIS, COLON 07/22/2007  . Dyspnea   . Dysuria 07/21/2017  . Edema, peripheral    a. chronic BLE edema, R>L. Prior trauma from dog attack and accident.  . Essential hypertension 07/22/2007   Qualifier: Diagnosis of  By: [name]    . HLD (hyperlipidemia) 02/03/2008   Qualifier: Diagnosis of  By: [name], [name]    . HYPERLIPIDEMIA 02/03/2008  . Hypersomnia    declines w/u  . Hypertension   . Hypotension (arterial) 04/30/2017  . MENOPAUSAL DISORDER 01/09/2011  . Morbid obesity ([place]) 07/22/2007  . NICM (nonischemic cardiomyopathy) ([place]) 10/24/2016  . PAF (paroxysmal atrial fibrillation) ([place])   . Raynaud's syndrome 07/22/2007  . Stroke ([place]) 2017  . Suspected sleep apnea 05/20/2017  . THYROID NODULE, RIGHT 01/04/2010  . VITAMIN D DEFICIENCY 01/09/2011   Qualifier: Diagnosis of  By: [name], [name]    Past Surgical History:  Procedure Laterality Date  . CARDIAC CATHETERIZATION N/A 10/23/2016   Procedure: Left Heart Cath and Coronary Angiography;  Surgeon: [name], MD;  Location: [facility];  Service: Cardiovascular;  Laterality: N/A;  . CARDIOVERSION N/A 10/31/2016   Procedure: CARDIOVERSION;  Surgeon: [name], MD;  Location: [facility];  Service: Cardiovascular;  Laterality: N/A;  . CARDIOVERSION N/A 11/03/2016   Procedure: CARDIOVERSION;  Surgeon: [name], MD;  Location: [place];  Service: Cardiovascular;  Laterality: N/A;  . CARDIOVERSION N/A 11/18/2016   Procedure: CARDIOVERSION;  Surgeon: [name], MD;  Location: [facility] ENDOSCOPY;  Service: Cardiovascular;  Laterality: N/A;  . CARDIOVERSION N/A 03/25/2017   Procedure: CARDIOVERSION;  Surgeon: [name], MD;  Location: [facility] ENDOSCOPY;  Service: Cardiovascular;  Laterality: N/A;  . CARDIOVERSION N/A 05/06/2017   Procedure: CARDIOVERSION;  Surgeon: [name], MD;  Location: [facility] ENDOSCOPY;  Service: Cardiovascular;  Laterality: N/A;  . CARDIOVERSION N/A 05/12/2017   Procedure: CARDIOVERSION;  Surgeon: [name], MD;  Location: [facility] ENDOSCOPY;  Service: Cardiovascular;  Laterality: N/A;  . COLONOSCOPY N/A 12/04/2017   Procedure: COLONOSCOPY;  Surgeon: [name], MD;  Location: [facility] ENDOSCOPY;  Service: Endoscopy;  Laterality: N/A;  . COLONOSCOPY W/ POLYPECTOMY  02/2011   pan diverticulosis.   tubular adenoma without dysplasia on 5 mm sigmoid polyp.  Dr Fuller Plan.    Marland Kitchen PARTIAL HYSTERECTOMY     1 OVARY LEFT  . TEE WITHOUT CARDIOVERSION N/A 10/31/2016   Procedure: TRANSESOPHAGEAL ECHOCARDIOGRAM (TEE);  Surgeon: [name], MD;  Location: [facility] ENDOSCOPY;  Service: Cardiovascular;  Laterality: N/A;  . TEE WITHOUT CARDIOVERSION N/A 03/25/2017   Procedure: TRANSESOPHAGEAL ECHOCARDIOGRAM (TEE);  Surgeon: [name], MD;  Location: [facility] ENDOSCOPY;  Service: Cardiovascular;  Laterality: N/A;    reports that she has quit smoking. Her smoking use included cigarettes. She started smoking about 9 months ago. She smoked 0.50 packs per day. She has never used smokeless tobacco. She reports that she does not drink alcohol or use drugs. family history includes Asthma in her mother; Diabetes in her father; Heart disease in her father and sister; Lung disease in her sister. Allergies  Allergen Reactions  . Ace Inhibitors Palpitations   Current Outpatient Medications on File Prior to Visit  Medication Sig Dispense Refill  . amiodarone (PACERONE) 200 MG tablet 400 mg (2 tabs) two times daily for 1 week and then 200 mg (1 tab) two times daily thereafter. 60 tablet 0  . apixaban (ELIQUIS) 5 MG TABS tablet Take 1 tablet (5 mg total) by mouth 2 (two) times daily. 60 tablet 11  . epoetin alfa (EPOGEN,PROCRIT) 56812 UNIT/ML injection Inject 5,000 Units into the skin once a week.    . furosemide (LASIX) 80 MG tablet Take 1 tablet (80 mg total) by mouth 2 (two) times daily for 2 days, THEN 1 tablet (80 mg total) daily. 34 tablet 6  . hydrALAZINE (APRESOLINE) 25 MG tablet Take 0.5 tablets (12.5 mg total) by mouth 3 (three) times daily. 45 tablet 0  . isosorbide mononitrate (IMDUR) 30 MG 24 hr tablet Take 0.5 tablets (15 mg total) by mouth daily. 15 tablet 0  . potassium chloride SA (K-DUR,KLOR-CON) 20 MEQ tablet Take 2 tablets (40 mEq total) by mouth daily. 60 tablet 0  . ranolazine (RANEXA) 500 MG 12 hr tablet  TAKE 1 TABLET BY MOUTH TWICE A DAY (Patient taking differently: Take 500 mg by mouth 2 (two) times daily. ) 60 tablet 5  . rosuvastatin (CRESTOR) 10 MG tablet Take 10 mg by mouth daily.     No current facility-administered medications on file prior to visit.    Review of Systems  Constitutional: Negative for other unusual diaphoresis or sweats HENT: Negative for ear discharge or swelling Eyes: Negative for other worsening visual disturbances Respiratory: Negative for stridor or other swelling  Gastrointestinal: Negative for worsening distension or other blood Genitourinary: Negative for retention or other urinary change Musculoskeletal: Negative for other MSK pain or swelling Skin: Negative for color change or other new lesions Neurological: Negative for worsening tremors and other numbness  Psychiatric/Behavioral: Negative for worsening agitation or other fatigue All other system neg per pt    Objective:   Physical Exam BP 118/76   Pulse 72   Temp 98.4 F (36.9 C) (Oral)   Ht 5\' 6"  (1.676 m)   Wt 203 lb (92.1 kg)   SpO2 99%   BMI 32.77 kg/m  VS noted, not ill appearing  Constitutional: Pt appears in NAD HENT: Head: NCAT.  Right Ear: External ear normal.  Left Ear: External ear normal.  Eyes: . Pupils are equal, round, and reactive to light. Conjunctivae and EOM are normal Nose: without d/c or deformity Neck: Neck supple. Gross normal ROM Cardiovascular: Normal rate and regular rhythm.   Pulmonary/Chest: Effort normal and breath sounds without rales or wheezing.  Abd:  Soft, NT, ND, + BS, no organomegaly Neurological: Pt is alert. At baseline orientation, motor grossly intact Skin: Skin is warm. No rashes, other new lesions, no LE edema Psychiatric: Pt behavior is normal without agitation  No other exam findings Lab Results  Component Value Date   WBC 6.4 08/30/2018   HGB 12.5 08/30/2018   HCT 40.5 08/30/2018   PLT 243.0 08/30/2018   GLUCOSE 95 08/30/2018   CHOL 93  08/06/2018   TRIG 78 08/06/2018   HDL 34 (L) 08/06/2018   LDLDIRECT 121.6 03/15/2012   LDLCALC 43 08/06/2018   ALT 9 08/30/2018   AST 12 08/30/2018   NA 138 08/30/2018   K 4.1 08/30/2018   CL 95 (L) 08/30/2018   CREATININE 1.79 (H) 08/30/2018   BUN 26 (H) 08/30/2018   CO2 36 (H) 08/30/2018   TSH 0.524 04/13/2018   INR 2.46 08/05/2018   HGBA1C 6.4 (H) 08/06/2018       Assessment & Plan:

## 2018-08-30 NOTE — Assessment & Plan Note (Signed)
stable overall by history and exam, recent data reviewed with pt, and pt to continue medical treatment as before,  to f/u any worsening symptoms or concerns  

## 2018-08-30 NOTE — Assessment & Plan Note (Signed)
Declines f/u ua today

## 2018-08-30 NOTE — Patient Instructions (Signed)
You had the flu shot today  Please continue all other medications as before, and refills have been done if requested.  Please have the pharmacy call with any other refills you may need.  Please keep your appointments with your specialists as you may have planned  Please go to the LAB in the Basement (turn left off the elevator) for the tests to be done today  You will be contacted by phone if any changes need to be made immediately.  Otherwise, you will receive a letter about your results with an explanation, but please check with MyChart first.  Please remember to sign up for MyChart if you have not done so, as this will be important to you in the future with finding out test results, communicating by private email, and scheduling acute appointments online when needed.  Please return in 6 months, or sooner if needed

## 2018-08-31 ENCOUNTER — Encounter (HOSPITAL_COMMUNITY): Payer: Medicare HMO

## 2018-09-01 ENCOUNTER — Telehealth: Payer: Self-pay | Admitting: Internal Medicine

## 2018-09-01 DIAGNOSIS — J9611 Chronic respiratory failure with hypoxia: Secondary | ICD-10-CM | POA: Diagnosis not present

## 2018-09-01 DIAGNOSIS — I2510 Atherosclerotic heart disease of native coronary artery without angina pectoris: Secondary | ICD-10-CM | POA: Diagnosis not present

## 2018-09-01 DIAGNOSIS — I480 Paroxysmal atrial fibrillation: Secondary | ICD-10-CM | POA: Diagnosis not present

## 2018-09-01 DIAGNOSIS — M170 Bilateral primary osteoarthritis of knee: Secondary | ICD-10-CM | POA: Diagnosis not present

## 2018-09-01 DIAGNOSIS — I5042 Chronic combined systolic (congestive) and diastolic (congestive) heart failure: Secondary | ICD-10-CM | POA: Diagnosis not present

## 2018-09-01 DIAGNOSIS — Z9981 Dependence on supplemental oxygen: Secondary | ICD-10-CM | POA: Diagnosis not present

## 2018-09-01 DIAGNOSIS — I130 Hypertensive heart and chronic kidney disease with heart failure and stage 1 through stage 4 chronic kidney disease, or unspecified chronic kidney disease: Secondary | ICD-10-CM | POA: Diagnosis not present

## 2018-09-01 DIAGNOSIS — Z8673 Personal history of transient ischemic attack (TIA), and cerebral infarction without residual deficits: Secondary | ICD-10-CM | POA: Diagnosis not present

## 2018-09-01 DIAGNOSIS — N184 Chronic kidney disease, stage 4 (severe): Secondary | ICD-10-CM | POA: Diagnosis not present

## 2018-09-01 NOTE — Telephone Encounter (Signed)
Please advise on O2 question. Thanks!

## 2018-09-01 NOTE — Telephone Encounter (Signed)
Ok for verbals  OK to d/c Home o2

## 2018-09-01 NOTE — Telephone Encounter (Signed)
Copied from [place] [id number]. Topic: Quick Communication - See Telephone Encounter >> Sep 01, 2018  3:11 PM Adelene Idler wrote: Claiborne Billings from [facility] is calling requesting verbal orders for home physical therapy for 1-2 x week for 4 weeks  Cb# [phone]  Also, pt is refusing to use oxygen. Her oxygens levels are fine. Can she stop using ?

## 2018-09-02 ENCOUNTER — Encounter (HOSPITAL_COMMUNITY): Payer: Medicare HMO

## 2018-09-02 NOTE — Telephone Encounter (Signed)
Called Kelly, LVM with details below.

## 2018-09-03 NOTE — Research (Signed)
Late entry: Subject to research clinic for visit week 49 in the [place] research study.  Has been hospitalized recently but has no cos at this time.  Medication was dispensed and subject will bring back all med boxes not returned at next visit or sooner if she returns to the clinic before her next scheduled visit.

## 2018-09-06 DIAGNOSIS — I2510 Atherosclerotic heart disease of native coronary artery without angina pectoris: Secondary | ICD-10-CM | POA: Diagnosis not present

## 2018-09-06 DIAGNOSIS — I5042 Chronic combined systolic (congestive) and diastolic (congestive) heart failure: Secondary | ICD-10-CM | POA: Diagnosis not present

## 2018-09-06 DIAGNOSIS — I130 Hypertensive heart and chronic kidney disease with heart failure and stage 1 through stage 4 chronic kidney disease, or unspecified chronic kidney disease: Secondary | ICD-10-CM | POA: Diagnosis not present

## 2018-09-06 DIAGNOSIS — Z8673 Personal history of transient ischemic attack (TIA), and cerebral infarction without residual deficits: Secondary | ICD-10-CM | POA: Diagnosis not present

## 2018-09-06 DIAGNOSIS — M170 Bilateral primary osteoarthritis of knee: Secondary | ICD-10-CM | POA: Diagnosis not present

## 2018-09-06 DIAGNOSIS — J9611 Chronic respiratory failure with hypoxia: Secondary | ICD-10-CM | POA: Diagnosis not present

## 2018-09-06 DIAGNOSIS — Z9981 Dependence on supplemental oxygen: Secondary | ICD-10-CM | POA: Diagnosis not present

## 2018-09-06 DIAGNOSIS — N184 Chronic kidney disease, stage 4 (severe): Secondary | ICD-10-CM | POA: Diagnosis not present

## 2018-09-06 DIAGNOSIS — I480 Paroxysmal atrial fibrillation: Secondary | ICD-10-CM | POA: Diagnosis not present

## 2018-09-07 ENCOUNTER — Encounter (HOSPITAL_COMMUNITY): Payer: Medicare HMO

## 2018-09-08 ENCOUNTER — Other Ambulatory Visit (HOSPITAL_COMMUNITY): Payer: Self-pay | Admitting: Pharmacist

## 2018-09-08 DIAGNOSIS — Z9981 Dependence on supplemental oxygen: Secondary | ICD-10-CM | POA: Diagnosis not present

## 2018-09-08 DIAGNOSIS — I480 Paroxysmal atrial fibrillation: Secondary | ICD-10-CM | POA: Diagnosis not present

## 2018-09-08 DIAGNOSIS — J9611 Chronic respiratory failure with hypoxia: Secondary | ICD-10-CM | POA: Diagnosis not present

## 2018-09-08 DIAGNOSIS — M170 Bilateral primary osteoarthritis of knee: Secondary | ICD-10-CM | POA: Diagnosis not present

## 2018-09-08 DIAGNOSIS — I130 Hypertensive heart and chronic kidney disease with heart failure and stage 1 through stage 4 chronic kidney disease, or unspecified chronic kidney disease: Secondary | ICD-10-CM | POA: Diagnosis not present

## 2018-09-08 DIAGNOSIS — I5042 Chronic combined systolic (congestive) and diastolic (congestive) heart failure: Secondary | ICD-10-CM | POA: Diagnosis not present

## 2018-09-08 DIAGNOSIS — I2510 Atherosclerotic heart disease of native coronary artery without angina pectoris: Secondary | ICD-10-CM | POA: Diagnosis not present

## 2018-09-08 DIAGNOSIS — Z8673 Personal history of transient ischemic attack (TIA), and cerebral infarction without residual deficits: Secondary | ICD-10-CM | POA: Diagnosis not present

## 2018-09-08 DIAGNOSIS — N184 Chronic kidney disease, stage 4 (severe): Secondary | ICD-10-CM | POA: Diagnosis not present

## 2018-09-08 MED ORDER — HYDRALAZINE HCL 25 MG PO TABS: 12.5000 mg | ORAL_TABLET | Freq: Three times a day (TID) | ORAL | 5 refills | Status: DC

## 2018-09-08 MED ORDER — POTASSIUM CHLORIDE CRYS ER 20 MEQ PO TBCR: 40.0000 meq | EXTENDED_RELEASE_TABLET | Freq: Every day | ORAL | 5 refills | Status: DC

## 2018-09-08 MED ORDER — ISOSORBIDE MONONITRATE ER 30 MG PO TB24: 15.0000 mg | ORAL_TABLET | Freq: Every day | ORAL | 5 refills | Status: DC

## 2018-09-08 MED ORDER — RANOLAZINE ER 500 MG PO TB12: 500.0000 mg | ORAL_TABLET | Freq: Two times a day (BID) | ORAL | 5 refills | Status: DC

## 2018-09-08 MED ORDER — ROSUVASTATIN CALCIUM 10 MG PO TABS: 10.0000 mg | ORAL_TABLET | Freq: Every day | ORAL | 5 refills | Status: DC

## 2018-09-08 MED ORDER — AMIODARONE HCL 200 MG PO TABS: 200.0000 mg | ORAL_TABLET | Freq: Two times a day (BID) | ORAL | 5 refills | Status: DC

## 2018-09-09 ENCOUNTER — Encounter (HOSPITAL_COMMUNITY): Payer: Medicare HMO

## 2018-09-10 DIAGNOSIS — Z9981 Dependence on supplemental oxygen: Secondary | ICD-10-CM | POA: Diagnosis not present

## 2018-09-10 DIAGNOSIS — M170 Bilateral primary osteoarthritis of knee: Secondary | ICD-10-CM | POA: Diagnosis not present

## 2018-09-10 DIAGNOSIS — I5042 Chronic combined systolic (congestive) and diastolic (congestive) heart failure: Secondary | ICD-10-CM | POA: Diagnosis not present

## 2018-09-10 DIAGNOSIS — N184 Chronic kidney disease, stage 4 (severe): Secondary | ICD-10-CM | POA: Diagnosis not present

## 2018-09-10 DIAGNOSIS — J9611 Chronic respiratory failure with hypoxia: Secondary | ICD-10-CM | POA: Diagnosis not present

## 2018-09-10 DIAGNOSIS — Z8673 Personal history of transient ischemic attack (TIA), and cerebral infarction without residual deficits: Secondary | ICD-10-CM | POA: Diagnosis not present

## 2018-09-10 DIAGNOSIS — I480 Paroxysmal atrial fibrillation: Secondary | ICD-10-CM | POA: Diagnosis not present

## 2018-09-10 DIAGNOSIS — I2510 Atherosclerotic heart disease of native coronary artery without angina pectoris: Secondary | ICD-10-CM | POA: Diagnosis not present

## 2018-09-10 DIAGNOSIS — I130 Hypertensive heart and chronic kidney disease with heart failure and stage 1 through stage 4 chronic kidney disease, or unspecified chronic kidney disease: Secondary | ICD-10-CM | POA: Diagnosis not present

## 2018-09-14 ENCOUNTER — Encounter (HOSPITAL_COMMUNITY): Payer: Medicare HMO

## 2018-09-16 ENCOUNTER — Encounter (HOSPITAL_COMMUNITY): Payer: Medicare HMO

## 2018-09-16 DIAGNOSIS — I130 Hypertensive heart and chronic kidney disease with heart failure and stage 1 through stage 4 chronic kidney disease, or unspecified chronic kidney disease: Secondary | ICD-10-CM | POA: Diagnosis not present

## 2018-09-16 DIAGNOSIS — J9611 Chronic respiratory failure with hypoxia: Secondary | ICD-10-CM | POA: Diagnosis not present

## 2018-09-16 DIAGNOSIS — Z9981 Dependence on supplemental oxygen: Secondary | ICD-10-CM | POA: Diagnosis not present

## 2018-09-16 DIAGNOSIS — N184 Chronic kidney disease, stage 4 (severe): Secondary | ICD-10-CM | POA: Diagnosis not present

## 2018-09-16 DIAGNOSIS — I480 Paroxysmal atrial fibrillation: Secondary | ICD-10-CM | POA: Diagnosis not present

## 2018-09-16 DIAGNOSIS — I2510 Atherosclerotic heart disease of native coronary artery without angina pectoris: Secondary | ICD-10-CM | POA: Diagnosis not present

## 2018-09-16 DIAGNOSIS — M170 Bilateral primary osteoarthritis of knee: Secondary | ICD-10-CM | POA: Diagnosis not present

## 2018-09-16 DIAGNOSIS — I5042 Chronic combined systolic (congestive) and diastolic (congestive) heart failure: Secondary | ICD-10-CM | POA: Diagnosis not present

## 2018-09-16 DIAGNOSIS — Z8673 Personal history of transient ischemic attack (TIA), and cerebral infarction without residual deficits: Secondary | ICD-10-CM | POA: Diagnosis not present

## 2018-09-17 DIAGNOSIS — I2510 Atherosclerotic heart disease of native coronary artery without angina pectoris: Secondary | ICD-10-CM | POA: Diagnosis not present

## 2018-09-17 DIAGNOSIS — J9611 Chronic respiratory failure with hypoxia: Secondary | ICD-10-CM | POA: Diagnosis not present

## 2018-09-17 DIAGNOSIS — I480 Paroxysmal atrial fibrillation: Secondary | ICD-10-CM | POA: Diagnosis not present

## 2018-09-17 DIAGNOSIS — Z9981 Dependence on supplemental oxygen: Secondary | ICD-10-CM | POA: Diagnosis not present

## 2018-09-17 DIAGNOSIS — I5042 Chronic combined systolic (congestive) and diastolic (congestive) heart failure: Secondary | ICD-10-CM | POA: Diagnosis not present

## 2018-09-17 DIAGNOSIS — M170 Bilateral primary osteoarthritis of knee: Secondary | ICD-10-CM | POA: Diagnosis not present

## 2018-09-17 DIAGNOSIS — Z8673 Personal history of transient ischemic attack (TIA), and cerebral infarction without residual deficits: Secondary | ICD-10-CM | POA: Diagnosis not present

## 2018-09-17 DIAGNOSIS — N184 Chronic kidney disease, stage 4 (severe): Secondary | ICD-10-CM | POA: Diagnosis not present

## 2018-09-17 DIAGNOSIS — I130 Hypertensive heart and chronic kidney disease with heart failure and stage 1 through stage 4 chronic kidney disease, or unspecified chronic kidney disease: Secondary | ICD-10-CM | POA: Diagnosis not present

## 2018-09-21 ENCOUNTER — Encounter (HOSPITAL_COMMUNITY): Payer: Medicare HMO

## 2018-09-22 DIAGNOSIS — R339 Retention of urine, unspecified: Secondary | ICD-10-CM | POA: Diagnosis not present

## 2018-09-23 ENCOUNTER — Encounter (HOSPITAL_COMMUNITY): Payer: Medicare HMO

## 2018-09-24 ENCOUNTER — Ambulatory Visit (HOSPITAL_COMMUNITY)
Admission: RE | Admit: 2018-09-24 | Discharge: 2018-09-24 | Disposition: A | Discharge status: Home / Self Care | Payer: 59 | Source: Ambulatory Visit | Attending: Nephrology | Admitting: Nephrology

## 2018-09-24 VITALS — BP 120/77 | HR 63 | Temp 98.4°F | Resp 20

## 2018-09-24 DIAGNOSIS — D631 Anemia in chronic kidney disease: Secondary | ICD-10-CM | POA: Diagnosis not present

## 2018-09-24 DIAGNOSIS — N183 Chronic kidney disease, stage 3 (moderate): Secondary | ICD-10-CM | POA: Diagnosis not present

## 2018-09-24 DIAGNOSIS — N184 Chronic kidney disease, stage 4 (severe): Secondary | ICD-10-CM

## 2018-09-24 DIAGNOSIS — N170 Acute kidney failure with tubular necrosis: Secondary | ICD-10-CM

## 2018-09-24 DIAGNOSIS — M170 Bilateral primary osteoarthritis of knee: Secondary | ICD-10-CM | POA: Diagnosis not present

## 2018-09-24 DIAGNOSIS — Z8673 Personal history of transient ischemic attack (TIA), and cerebral infarction without residual deficits: Secondary | ICD-10-CM | POA: Diagnosis not present

## 2018-09-24 DIAGNOSIS — Z9981 Dependence on supplemental oxygen: Secondary | ICD-10-CM | POA: Diagnosis not present

## 2018-09-24 DIAGNOSIS — I5042 Chronic combined systolic (congestive) and diastolic (congestive) heart failure: Secondary | ICD-10-CM | POA: Diagnosis not present

## 2018-09-24 DIAGNOSIS — I480 Paroxysmal atrial fibrillation: Secondary | ICD-10-CM | POA: Diagnosis not present

## 2018-09-24 DIAGNOSIS — J9611 Chronic respiratory failure with hypoxia: Secondary | ICD-10-CM | POA: Diagnosis not present

## 2018-09-24 DIAGNOSIS — I2510 Atherosclerotic heart disease of native coronary artery without angina pectoris: Secondary | ICD-10-CM | POA: Diagnosis not present

## 2018-09-24 DIAGNOSIS — I130 Hypertensive heart and chronic kidney disease with heart failure and stage 1 through stage 4 chronic kidney disease, or unspecified chronic kidney disease: Secondary | ICD-10-CM | POA: Diagnosis not present

## 2018-09-24 LAB — IRON AND TIBC
Iron: 82 ug/dL (ref 28–170)
SATURATION RATIOS: 22 % (ref 10.4–31.8)
TIBC: 374 ug/dL (ref 250–450)
UIBC: 292 ug/dL

## 2018-09-24 LAB — FERRITIN: FERRITIN: 81 ng/mL (ref 11–307)

## 2018-09-24 LAB — POCT HEMOGLOBIN-HEMACUE: HEMOGLOBIN: 13.7 g/dL (ref 12.0–15.0)

## 2018-09-24 MED ORDER — EPOETIN ALFA-EPBX 2000 UNIT/ML IJ SOLN
2000.0000 [IU] | Freq: Once | INTRAMUSCULAR | Status: DC
  Filled 2018-09-24: qty 1

## 2018-09-24 MED ORDER — EPOETIN ALFA-EPBX 3000 UNIT/ML IJ SOLN
3000.0000 [IU] | Freq: Once | INTRAMUSCULAR | Status: DC
  Filled 2018-09-24: qty 1

## 2018-09-24 MED ORDER — EPOETIN ALFA 10000 UNIT/ML IJ SOLN: 5000.0000 [IU] | INTRAMUSCULAR | Status: DC

## 2018-09-24 NOTE — Telephone Encounter (Signed)
Claiborne Billings called and asked if we could extend home health PT  2x3 1x1. Please advise Cb#[phone]

## 2018-09-27 NOTE — Telephone Encounter (Signed)
Called Kelly, LVM with verbal orders.

## 2018-09-28 ENCOUNTER — Encounter (HOSPITAL_COMMUNITY): Payer: Medicare HMO

## 2018-09-30 ENCOUNTER — Encounter (HOSPITAL_COMMUNITY): Payer: Medicare HMO

## 2018-09-30 DIAGNOSIS — Z8673 Personal history of transient ischemic attack (TIA), and cerebral infarction without residual deficits: Secondary | ICD-10-CM | POA: Diagnosis not present

## 2018-09-30 DIAGNOSIS — I480 Paroxysmal atrial fibrillation: Secondary | ICD-10-CM | POA: Diagnosis not present

## 2018-09-30 DIAGNOSIS — N184 Chronic kidney disease, stage 4 (severe): Secondary | ICD-10-CM | POA: Diagnosis not present

## 2018-09-30 DIAGNOSIS — I2510 Atherosclerotic heart disease of native coronary artery without angina pectoris: Secondary | ICD-10-CM | POA: Diagnosis not present

## 2018-09-30 DIAGNOSIS — I5042 Chronic combined systolic (congestive) and diastolic (congestive) heart failure: Secondary | ICD-10-CM | POA: Diagnosis not present

## 2018-09-30 DIAGNOSIS — M170 Bilateral primary osteoarthritis of knee: Secondary | ICD-10-CM | POA: Diagnosis not present

## 2018-09-30 DIAGNOSIS — I130 Hypertensive heart and chronic kidney disease with heart failure and stage 1 through stage 4 chronic kidney disease, or unspecified chronic kidney disease: Secondary | ICD-10-CM | POA: Diagnosis not present

## 2018-09-30 DIAGNOSIS — Z9981 Dependence on supplemental oxygen: Secondary | ICD-10-CM | POA: Diagnosis not present

## 2018-09-30 DIAGNOSIS — J9611 Chronic respiratory failure with hypoxia: Secondary | ICD-10-CM | POA: Diagnosis not present

## 2018-10-01 ENCOUNTER — Encounter (HOSPITAL_COMMUNITY): Payer: 59

## 2018-10-01 NOTE — Addendum Note (Signed)
Encounter addended by: Ivonne Andrew, RD on: 10/01/2018 2:36 PM  Actions taken: Flowsheet data copied forward, Visit Navigator Flowsheet section accepted

## 2018-10-04 ENCOUNTER — Ambulatory Visit (HOSPITAL_COMMUNITY)
Admission: RE | Admit: 2018-10-04 | Discharge: 2018-10-04 | Disposition: A | Discharge status: Home / Self Care | Payer: Medicare HMO | Source: Ambulatory Visit | Attending: Nephrology | Admitting: Nephrology

## 2018-10-04 VITALS — BP 132/83 | HR 61 | Temp 98.5°F | Resp 20

## 2018-10-04 DIAGNOSIS — N170 Acute kidney failure with tubular necrosis: Secondary | ICD-10-CM | POA: Diagnosis not present

## 2018-10-04 DIAGNOSIS — N184 Chronic kidney disease, stage 4 (severe): Secondary | ICD-10-CM

## 2018-10-04 LAB — POCT HEMOGLOBIN-HEMACUE: HEMOGLOBIN: 13 g/dL (ref 12.0–15.0)

## 2018-10-04 MED ORDER — EPOETIN ALFA 10000 UNIT/ML IJ SOLN: 5000.0000 [IU] | INTRAMUSCULAR | Status: DC

## 2018-10-05 ENCOUNTER — Ambulatory Visit (HOSPITAL_COMMUNITY)
Admission: RE | Admit: 2018-10-05 | Discharge: 2018-10-05 | Disposition: A | Discharge status: Home / Self Care | Payer: Medicare HMO | Source: Ambulatory Visit | Attending: Internal Medicine | Admitting: Internal Medicine

## 2018-10-05 ENCOUNTER — Encounter (HOSPITAL_COMMUNITY): Payer: Medicare HMO

## 2018-10-05 VITALS — BP 142/84 | HR 63 | Wt 193.4 lb

## 2018-10-05 DIAGNOSIS — I130 Hypertensive heart and chronic kidney disease with heart failure and stage 1 through stage 4 chronic kidney disease, or unspecified chronic kidney disease: Secondary | ICD-10-CM | POA: Insufficient documentation

## 2018-10-05 DIAGNOSIS — M1990 Unspecified osteoarthritis, unspecified site: Secondary | ICD-10-CM | POA: Diagnosis not present

## 2018-10-05 DIAGNOSIS — Z7901 Long term (current) use of anticoagulants: Secondary | ICD-10-CM | POA: Insufficient documentation

## 2018-10-05 DIAGNOSIS — J449 Chronic obstructive pulmonary disease, unspecified: Secondary | ICD-10-CM | POA: Insufficient documentation

## 2018-10-05 DIAGNOSIS — D649 Anemia, unspecified: Secondary | ICD-10-CM | POA: Diagnosis not present

## 2018-10-05 DIAGNOSIS — Z825 Family history of asthma and other chronic lower respiratory diseases: Secondary | ICD-10-CM | POA: Diagnosis not present

## 2018-10-05 DIAGNOSIS — E785 Hyperlipidemia, unspecified: Secondary | ICD-10-CM | POA: Insufficient documentation

## 2018-10-05 DIAGNOSIS — Z8249 Family history of ischemic heart disease and other diseases of the circulatory system: Secondary | ICD-10-CM | POA: Insufficient documentation

## 2018-10-05 DIAGNOSIS — Z888 Allergy status to other drugs, medicaments and biological substances status: Secondary | ICD-10-CM | POA: Diagnosis not present

## 2018-10-05 DIAGNOSIS — N183 Chronic kidney disease, stage 3 unspecified: Secondary | ICD-10-CM

## 2018-10-05 DIAGNOSIS — I5022 Chronic systolic (congestive) heart failure: Secondary | ICD-10-CM | POA: Insufficient documentation

## 2018-10-05 DIAGNOSIS — I482 Chronic atrial fibrillation, unspecified: Secondary | ICD-10-CM | POA: Diagnosis not present

## 2018-10-05 DIAGNOSIS — Z8673 Personal history of transient ischemic attack (TIA), and cerebral infarction without residual deficits: Secondary | ICD-10-CM | POA: Insufficient documentation

## 2018-10-05 DIAGNOSIS — I428 Other cardiomyopathies: Secondary | ICD-10-CM | POA: Insufficient documentation

## 2018-10-05 DIAGNOSIS — Z833 Family history of diabetes mellitus: Secondary | ICD-10-CM | POA: Insufficient documentation

## 2018-10-05 DIAGNOSIS — I480 Paroxysmal atrial fibrillation: Secondary | ICD-10-CM | POA: Diagnosis not present

## 2018-10-05 DIAGNOSIS — I5082 Biventricular heart failure: Secondary | ICD-10-CM | POA: Insufficient documentation

## 2018-10-05 DIAGNOSIS — I2510 Atherosclerotic heart disease of native coronary artery without angina pectoris: Secondary | ICD-10-CM | POA: Diagnosis not present

## 2018-10-05 DIAGNOSIS — Z79899 Other long term (current) drug therapy: Secondary | ICD-10-CM | POA: Insufficient documentation

## 2018-10-05 DIAGNOSIS — Z87891 Personal history of nicotine dependence: Secondary | ICD-10-CM | POA: Diagnosis not present

## 2018-10-05 LAB — HEPATIC FUNCTION PANEL
ALK PHOS: 61 U/L (ref 38–126)
ALT: 19 U/L (ref 0–44)
AST: 21 U/L (ref 15–41)
Albumin: 3.6 g/dL (ref 3.5–5.0)
BILIRUBIN INDIRECT: 0.7 mg/dL (ref 0.3–0.9)
Bilirubin, Direct: 0.2 mg/dL (ref 0.0–0.2)
TOTAL PROTEIN: 7.1 g/dL (ref 6.5–8.1)
Total Bilirubin: 0.9 mg/dL (ref 0.3–1.2)

## 2018-10-05 LAB — BASIC METABOLIC PANEL
Anion gap: 8 (ref 5–15)
BUN: 59 mg/dL — ABNORMAL HIGH (ref 8–23)
CHLORIDE: 100 mmol/L (ref 98–111)
CO2: 28 mmol/L (ref 22–32)
CREATININE: 2.71 mg/dL — AB (ref 0.44–1.00)
Calcium: 9.4 mg/dL (ref 8.9–10.3)
GFR calc Af Amer: 20 mL/min — ABNORMAL LOW (ref >60)
GFR calc non Af Amer: 17 mL/min — ABNORMAL LOW (ref >60)
Glucose, Bld: 92 mg/dL (ref 70–99)
Potassium: 3.9 mmol/L (ref 3.5–5.1)
Sodium: 136 mmol/L (ref 135–145)

## 2018-10-05 LAB — TSH: TSH: 0.88 u[IU]/mL (ref 0.350–4.500)

## 2018-10-05 MED ORDER — AMIODARONE HCL 200 MG PO TABS: 200.0000 mg | ORAL_TABLET | Freq: Every day | ORAL | 5 refills | Status: DC

## 2018-10-05 NOTE — Patient Instructions (Signed)
DECREASE Amirodarone to 200 mg, one tab daily  Labs today We will only contact you if something comes back abnormal or we need to make some changes. Otherwise no news is good news!   Your physician recommends that you schedule a follow-up appointment in: 3 months with Dr [name]   Do the following things EVERYDAY: 1) Weigh yourself in the morning before breakfast. Write it down and keep it in a log. 2) Take your medicines as prescribed 3) Eat low salt foods-Limit salt (sodium) to 2000 mg per day.  4) Stay as active as you can everyday 5) Limit all fluids for the day to less than 2 liters

## 2018-10-05 NOTE — Progress Notes (Signed)
Advanced Heart Failure Clinic Note   PCP: Dr Wille Glaser  Primary Cardiologist: Dr. [name] Primary HF: [name] Urologist:  Nephrology: Dr Justin Mend EP:  Dr Curt Bears  HPI: Patricia Wagner is a 67 y.o. female with a past medical history of paroxysmal atrial fibrillation, NICM cardiomyopathy, Biventricular CHF, HTN, chronic foley due to urinary retentin, CKD Stage III, and HLD.   She has struggled with symptomatic afib since 10/2016. Had successful DCCV 10/2016. Failed DCCV 03/2017, after stopping her amiodarone. Seen in A-Fib clinic 04/09/17 and planned for repeat DCCV after further amio load.   Pt admitted to [facility] 6/6 - 05/16/17 with cardiogenic shock in the setting of persistent AF. Had tach CM with EF 15% with severe biventricular dysfunction. Started on amio gtt and PICC line placed for dobutamine with low output HF. Underwent DCCV 05/06/17 but went back into Afib 05/08/17 and developed recurrent cardiogenic shock, requiring re-initiation of dobutamine and amio. Pt had repeat DCCV 05/12/17 with successful conversion to NSR. Discharged on amio and Ranexa.  Last echocardiogram August 2018 showed ejection fraction 30-35%,severe left ventricular hypertrophy, mild to moderate mitral regurgitation, biatrial enlargement and moderately reduced RV function. Fat pad bx negative. PYP 10/18 felt equivocal but not favored for amyloid. Patient has been scheduled for atrial fibrillation ablation January 2019 as she has had cardiogenic shock with atrial fibrillation previously.   Admitted 2/5 through 01/08/2018 with UTI and AKI. Found to have neurogenic bladder with urinary retention .Creatinine peaked at 7. Lasix, spiro, and entresto stopped. Required home IV antibiotics and discharged with foley. At the time of discharge creatinine was down to 1.07.   Admitted 03/05/18 and again on 4/22 with lower GI bleed. Initially was in setting of INR > 10, but transitioned from coumadin to Xarelto after first admission. Pt had  colonoscopy in 11/2017 which showed diverticular disease, so no repeat performed due to supratherapeutic INR. GI consulted. Tagged NM scan 03/15/18 with no active bleeding. Left off of AC for now.   Amyloid work up negative. Fat pad biopsy was negative. PYP scan negative.   Admitted 9/12 through 08/17/18. Hospital course complicated by acute hypoxic respiratory failure and a/c systolic/diastolic heart failure. Required short term intubation and diuresis with IV lasix. Discharged amiodarone taper. Discharge weight  was 215.8 pounds.   Today he returns for HF follow up. Overall feeling fine. She is frustrated because she still has the foley catheter. Denies SOB/PND/Orthopnea. No bleeding problems. Appetite ok. No fever or chills. Weight at home 185 pounds. Taking all medications.    ECHO 03/06/2018  Left ventricle: The cavity size was normal. There was severe   concentric hypertrophy. Systolic function was mildly to   moderately reduced. The estimated ejection fraction was in the   range of 40% to 45%. Mild diffuse hypokinesis with no   identifiable regional variations. Doppler parameters are   consistent with abnormal left ventricular relaxation (grade 1   diastolic dysfunction). - Aortic valve: There was mild stenosis. Valve area (VTI): 1.5   cm^2. Valve area (Vmax): 1.59 cm^2. Valve area (Vmean): 1.4 cm^2. - Mitral valve: Calcified annulus. - Left atrium: The atrium was severely dilated. - Right atrium: The atrium was moderately dilated. - Pericardium, extracardiac: There was a left pleural effusion.  Echo from 07/15/17 EF 30-35% Severe LVH. RV down.   Review of systems complete and found to be negative unless listed in HPI.   Past Medical History:  Diagnosis Date  . Acute blood loss anemia 03/05/2018  . Acute hypercapnic  respiratory failure ([place]) 04/30/2017  . Acute renal failure superimposed on chronic kidney disease ([place]) 10/20/2016  . Arthritis   . Atrial fibrillation ([place])    a. s/p  multiple cardioversions; failed tikosyn/sotalol.  . Bradycardia 11/20/2016  . CAD in native artery, s/p cardiac cath with non obstructive CAD 10/24/2016  . Cerebrovascular accident (CVA) due to embolism of left cerebellar artery ([place])   . CHF (congestive heart failure) ([place])   . Chronic anticoagulation 03/15/2018  . Chronic atrial fibrillation 03/05/2018  . Chronic systolic (congestive) heart failure ([place]) 03/05/2018  . CKD (chronic kidney disease) stage 3, GFR 30-59 ml/min (HCC)   . Coagulopathy ([place]) 03/05/2018  . DIVERTICULITIS, HX OF 07/25/2007  . Diverticulosis of colon with hemorrhage 07/22/2007   Qualifier: Diagnosis of  By: [name]    . DIVERTICULOSIS, COLON 07/22/2007  . Dyspnea   . Dysuria 07/21/2017  . Edema, peripheral    a. chronic BLE edema, R>L. Prior trauma from dog attack and accident.  . Essential hypertension 07/22/2007   Qualifier: Diagnosis of  By: [name]    . HLD (hyperlipidemia) 02/03/2008   Qualifier: Diagnosis of  By: [name], [name]   . HYPERLIPIDEMIA 02/03/2008  . Hypersomnia    declines w/u  . Hypertension   . Hypotension (arterial) 04/30/2017  . MENOPAUSAL DISORDER 01/09/2011  . Morbid obesity ([place]) 07/22/2007  . NICM (nonischemic cardiomyopathy) ([place]) 10/24/2016  . PAF (paroxysmal atrial fibrillation) ([place])   . Raynaud's syndrome 07/22/2007  . Stroke ([place]) 2017  . Suspected sleep apnea 05/20/2017  . THYROID NODULE, RIGHT 01/04/2010  . VITAMIN D DEFICIENCY 01/09/2011   Qualifier: Diagnosis of  By: [name], [name]     Current Outpatient Medications  Medication Sig Dispense Refill  . amiodarone (PACERONE) 200 MG tablet Take 1 tablet (200 mg total) by mouth 2 (two) times daily. 60 tablet 5  . apixaban (ELIQUIS) 5 MG TABS tablet Take 1 tablet (5 mg total) by mouth 2 (two) times daily. 60 tablet 11  . epoetin alfa (EPOGEN,PROCRIT) 57846 UNIT/ML injection Inject 5,000 Units into the skin once a week.    . furosemide (LASIX) 80 MG tablet Take 1 tablet  (80 mg total) by mouth 2 (two) times daily for 2 days, THEN 1 tablet (80 mg total) daily. 34 tablet 6  . hydrALAZINE (APRESOLINE) 25 MG tablet Take 0.5 tablets (12.5 mg total) by mouth 3 (three) times daily. 45 tablet 5  . isosorbide mononitrate (IMDUR) 30 MG 24 hr tablet Take 0.5 tablets (15 mg total) by mouth daily. 15 tablet 5  . potassium chloride SA (K-DUR,KLOR-CON) 20 MEQ tablet Take 2 tablets (40 mEq total) by mouth daily. 60 tablet 5  . ranolazine (RANEXA) 500 MG 12 hr tablet Take 1 tablet (500 mg total) by mouth 2 (two) times daily. 60 tablet 5  . rosuvastatin (CRESTOR) 10 MG tablet Take 1 tablet (10 mg total) by mouth daily. 30 tablet 5   No current facility-administered medications for this encounter.     Allergies  Allergen Reactions  . Ace Inhibitors Palpitations     Social History   Socioeconomic History  . Marital status: Single    Spouse name: Not on file  . Number of children: 1  . Years of education: Not on file  . Highest education level: Not on file  Occupational History  . Occupation: Engineer, manufacturing  . Financial resource strain: Not on file  . Food insecurity:    Worry:  Not on file    Inability: Not on file  . Transportation needs:    Medical: Not on file    Non-medical: Not on file  Tobacco Use  . Smoking status: Former Smoker    Packs/day: 0.50    Types: Cigarettes    Start date: 2019  . Smokeless tobacco: Never Used  . Tobacco comment: restarted back this year  Substance and Sexual Activity  . Alcohol use: No    Alcohol/week: 4.0 standard drinks    Types: 4 Cans of beer per week    Frequency: Never    Comment: Intermittent  . Drug use: No  . Sexual activity: Not on file  Lifestyle  . Physical activity:    Days per week: Not on file    Minutes per session: Not on file  . Stress: Not on file  Relationships  . Social connections:    Talks on phone: Not on file    Gets together: Not on file    Attends religious service: Not on file     Active member of club or organization: Not on file    Attends meetings of clubs or organizations: Not on file    Relationship status: Not on file  . Intimate partner violence:    Fear of current or ex partner: Not on file    Emotionally abused: Not on file    Physically abused: Not on file    Forced sexual activity: Not on file  Other Topics Concern  . Not on file  Social History Narrative  . Not on file     Family History  Problem Relation Age of Onset  . Asthma Mother   . Diabetes Father   . Heart disease Father        Died of presumed heart attack - 25s  . Lung disease Sister   . Heart disease Sister        Twin sister has heart issue, unclear what kind  . Thyroid disease Neg Hx     Vitals:   10/05/18 1504  BP: (!) 142/84  Pulse: 63  SpO2: 98%  Weight: 87.7 kg (193 lb 6.4 oz)    Wt Readings from Last 3 Encounters:  10/05/18 87.7 kg (193 lb 6.4 oz)  08/30/18 92.1 kg (203 lb)  08/24/18 97.8 kg (215 lb 9.6 oz)    PHYSICAL EXAM: General:  Well appearing. No resp difficulty. Walked  HEENT: normal Neck: supple. no JVD. Carotids 2+ bilat; no bruits. No lymphadenopathy or thryomegaly appreciated. Cor: PMI nondisplaced. Regular rate & rhythm. No rubs, gallops or murmurs. Lungs: clear Abdomen: soft, nontender, nondistended. No hepatosplenomegaly. No bruits or masses. Good bowel sounds. Extremities: no cyanosis, clubbing, rash, edema Neuro: alert & orientedx3, cranial nerves grossly intact. moves all 4 extremities w/o difficulty. Affect pleasant  ASSESSMENT & PLAN:  1. Chronic biventricular CHF due to NICM (suspect AF cardiomyopathy) - Echo 02/2018: EF 40-45%, grade 1 DD, RV normal -- Amyloid wu negative with negative fat pad biopsy and PYP scan  -  NYHA II. - Volume status stable. Continue continue lasix 80 mg daily.  - Continue hydralazine 12.5 mg three times a day.  - Continue imdur 15 mg daily - She is unable to afford Bidil - Off spiro and Entresto with  recent AKI.   2. Recurrent GI bleed - Tagged NM scan 03/15/18 negative - Note per Dr. Hilarie Fredrickson 03/16/18 states likely Diverticular source.  - No bleeding issues.  - Continue eliquis 5 mg BID.  3. PAF:  Cannot tolerate AF with recurrent shock. S/p DCCV on 5/2 (she stopped Amio after this) and DCCV on 05/06/17 and again 05/12/17. - She absolutely cannot tolerate AF with recurrent cardiogenic shock when AF recurs. - Continue Ranexa 500 mg BID.  -Regular pulse. Cut back amiodarone to 200 mg daily.   - CHA2DS2-VASc is at least 5 (CHF, HTN, Stroke, Female), she will gain another point for age in August.  - Continue Eliquis 5 mg BID.  -Check TSH, LFTs today. She is aware she needs eye exam daily.   4. Suspected Sleep Apnea: - Refuses sleep study. No change.   5.  COPD:  - No wheezing.  6. Recent AKI in the setting of urinary retention and UTI - Creatinine peaked at 7 during recent hospitalization in February. - Avoid NSAIDs.  - has chronic foley catheter. Followed by Urology.  -Check BMET today  7. HTN Ok today.   8. Anemia -Recent Hgb 12.5 on 08/30/2018    Follow up in 3 months with Dr [name].       Darrick Grinder, NP 10/05/18

## 2018-10-06 DIAGNOSIS — I130 Hypertensive heart and chronic kidney disease with heart failure and stage 1 through stage 4 chronic kidney disease, or unspecified chronic kidney disease: Secondary | ICD-10-CM | POA: Diagnosis not present

## 2018-10-06 DIAGNOSIS — N184 Chronic kidney disease, stage 4 (severe): Secondary | ICD-10-CM | POA: Diagnosis not present

## 2018-10-06 DIAGNOSIS — M170 Bilateral primary osteoarthritis of knee: Secondary | ICD-10-CM | POA: Diagnosis not present

## 2018-10-06 DIAGNOSIS — I5042 Chronic combined systolic (congestive) and diastolic (congestive) heart failure: Secondary | ICD-10-CM | POA: Diagnosis not present

## 2018-10-06 DIAGNOSIS — J9611 Chronic respiratory failure with hypoxia: Secondary | ICD-10-CM | POA: Diagnosis not present

## 2018-10-06 DIAGNOSIS — I2510 Atherosclerotic heart disease of native coronary artery without angina pectoris: Secondary | ICD-10-CM | POA: Diagnosis not present

## 2018-10-06 DIAGNOSIS — I480 Paroxysmal atrial fibrillation: Secondary | ICD-10-CM | POA: Diagnosis not present

## 2018-10-06 DIAGNOSIS — Z9981 Dependence on supplemental oxygen: Secondary | ICD-10-CM | POA: Diagnosis not present

## 2018-10-06 DIAGNOSIS — Z8673 Personal history of transient ischemic attack (TIA), and cerebral infarction without residual deficits: Secondary | ICD-10-CM | POA: Diagnosis not present

## 2018-10-07 ENCOUNTER — Telehealth (HOSPITAL_COMMUNITY): Payer: Self-pay | Admitting: Cardiology

## 2018-10-07 ENCOUNTER — Encounter (HOSPITAL_COMMUNITY): Payer: Medicare HMO

## 2018-10-07 DIAGNOSIS — I482 Chronic atrial fibrillation, unspecified: Secondary | ICD-10-CM

## 2018-10-07 DIAGNOSIS — I5022 Chronic systolic (congestive) heart failure: Secondary | ICD-10-CM

## 2018-10-07 MED ORDER — FUROSEMIDE 80 MG PO TABS: 80.0000 mg | ORAL_TABLET | Freq: Every day | ORAL | 6 refills | Status: DC

## 2018-10-07 NOTE — Telephone Encounter (Signed)
-----   Message from Conrad Cromwell, NP sent at 10/06/2018  3:44 PM EST ----- Renal function elevated. Please call and instruct to hold lasix and potassium for 2 days then cut back lasix to 80 mg daily. Repeat BMET next week. Please call.

## 2018-10-07 NOTE — Telephone Encounter (Signed)
Notes recorded by Kerry Dory, CMA on 10/07/2018 at 3:11 PM EST Repeat labs 11/20 @ 200 ------  Notes recorded by Kerry Dory, CMA on 10/07/2018 at 3:10 PM EST Patient aware. Patient voiced understanding   ------  Notes recorded by Conrad [place], NP on 10/06/2018 at 3:44 PM EST Renal function elevated. Please call and instruct to hold lasix and potassium for 2 days then cut back lasix to 80 mg daily. Repeat BMET next week. Please call.

## 2018-10-08 DIAGNOSIS — Z9981 Dependence on supplemental oxygen: Secondary | ICD-10-CM | POA: Diagnosis not present

## 2018-10-08 DIAGNOSIS — I480 Paroxysmal atrial fibrillation: Secondary | ICD-10-CM | POA: Diagnosis not present

## 2018-10-08 DIAGNOSIS — M170 Bilateral primary osteoarthritis of knee: Secondary | ICD-10-CM | POA: Diagnosis not present

## 2018-10-08 DIAGNOSIS — Z8673 Personal history of transient ischemic attack (TIA), and cerebral infarction without residual deficits: Secondary | ICD-10-CM | POA: Diagnosis not present

## 2018-10-08 DIAGNOSIS — I5042 Chronic combined systolic (congestive) and diastolic (congestive) heart failure: Secondary | ICD-10-CM | POA: Diagnosis not present

## 2018-10-08 DIAGNOSIS — J9611 Chronic respiratory failure with hypoxia: Secondary | ICD-10-CM | POA: Diagnosis not present

## 2018-10-08 DIAGNOSIS — N184 Chronic kidney disease, stage 4 (severe): Secondary | ICD-10-CM | POA: Diagnosis not present

## 2018-10-08 DIAGNOSIS — I2510 Atherosclerotic heart disease of native coronary artery without angina pectoris: Secondary | ICD-10-CM | POA: Diagnosis not present

## 2018-10-08 DIAGNOSIS — I130 Hypertensive heart and chronic kidney disease with heart failure and stage 1 through stage 4 chronic kidney disease, or unspecified chronic kidney disease: Secondary | ICD-10-CM | POA: Diagnosis not present

## 2018-10-10 NOTE — Addendum Note (Signed)
Encounter addended by: Leviathan Macera B, RN on: 10/10/2018 6:41 PM  Actions taken: Episode resolved, Flowsheet data copied forward, Visit Navigator Flowsheet section accepted, Sign clinical note

## 2018-10-10 NOTE — Progress Notes (Signed)
Discharge Progress Report  Patient Details  Name: Patricia Wagner MRN: [id number] Date of Birth: 27-Jan-1951 Referring Provider:     Pulmonary Rehab Walk Test from 07/15/2018 in [place]  Referring Provider  [name]       Number of Visits: 4 Reason for Discharge:  Early Exit:  hospitalization and furher deconditioning.  needs physical therapy  Smoking History:  Social History   Tobacco Use  Smoking Status Former Smoker  . Packs/day: 0.50  . Types: Cigarettes  . Start date: 2019  Smokeless Tobacco Never Used  Tobacco Comment   restarted back this year    Diagnosis:  Chronic systolic CHF (congestive heart failure) ([place])  ADL [facility]: Pulmonary Assessment Scores    Row Name 07/14/18 1513 07/19/18 1420 07/19/18 1439     [facility]L [facility]   ADL Phase  -  -  Entry   SOB Score total  27  -  -     CAT Score   CAT Score  28  -  -     mMRC Score   mMRC Score  -  1  1      Initial Exercise Prescription: Initial Exercise Prescription - 07/19/18 1400      Date of Initial Exercise RX and Referring Provider   Date  07/19/18    Referring Provider  [name]      NuStep   Level  2    SPM  80    Minutes  17    METs  1.5      Prescription Details   Frequency (times per week)  2    Duration  Progress to 45 minutes of aerobic exercise without signs/symptoms of physical distress      Intensity   THRR 40-80% of Max Heartrate  62-125    Ratings of Perceived Exertion  11-13    Perceived Dyspnea  0-4      Progression   Progression  Continue progressive overload as per policy without signs/symptoms or physical distress.      Resistance Training   Training Prescription  Yes    Weight  ORANGE BANDS    Reps  10-15       Discharge Exercise Prescription (Final Exercise Prescription Changes): Exercise Prescription Changes - 08/03/18 1725      Response to Exercise   Blood Pressure (Admit)  130/70    Blood Pressure (Exercise)  110/80    Blood Pressure (Exit)  122/60    Heart Rate (Admit)  60 bpm    Heart Rate (Exercise)  106 bpm    Heart Rate (Exit)  55 bpm    Oxygen Saturation (Admit)  90 %    Oxygen Saturation (Exercise)  94 %    Oxygen Saturation (Exit)  96 %    Rating of Perceived Exertion (Exercise)  9    Perceived Dyspnea (Exercise)  1    Duration  Progress to 45 minutes of aerobic exercise without signs/symptoms of physical distress    Intensity  THRR unchanged      Progression   Progression  Continue to progress workloads to maintain intensity without signs/symptoms of physical distress.      Resistance Training   Training Prescription  Yes    Weight  orange bands    Reps  10-15    Time  10 Minutes      NuStep   Level  2    SPM  80    Minutes  17  METs  1.3       Functional Capacity: 6 Minute Walk    Row Name 07/19/18 1439         6 Minute Walk   Phase  Initial     Distance  500 feet     Walk Time  6 minutes     # of Rest Breaks  0     MPH  0.94     METS  1.77     RPE  12     Perceived Dyspnea   1     Symptoms  Yes (comment)     Comments  Used wheelchair, 6/10 both knee pain, 3 cigarettes today     Resting HR  64 bpm     Resting BP  108/70     Resting Oxygen Saturation   90 %     Exercise Oxygen Saturation  during 6 min walk  88 %     Max Ex. HR  83 bpm     Max Ex. BP  138/80       Interval HR   1 Minute HR  77     2 Minute HR  82     3 Minute HR  82     4 Minute HR  83     5 Minute HR  83     6 Minute HR  81     2 Minute Post HR  72     Interval Heart Rate?  Yes       Interval Oxygen   Interval Oxygen?  Yes     Baseline Oxygen Saturation %  90 %     1 Minute Oxygen Saturation %  90 %     1 Minute Liters of Oxygen  0 L     2 Minute Oxygen Saturation %  88 %     2 Minute Liters of Oxygen  0 L     3 Minute Oxygen Saturation %  88 %     3 Minute Liters of Oxygen  0 L     4 Minute Oxygen Saturation %  89 %     4 Minute Liters of Oxygen  0 L     5 Minute Oxygen  Saturation %  89 %     5 Minute Liters of Oxygen  0 L     6 Minute Oxygen Saturation %  88 %     6 Minute Liters of Oxygen  0 L     2 Minute Post Oxygen Saturation %  87 %     2 Minute Post Liters of Oxygen  9 L        Psychological, QOL, Others - Outcomes: PHQ 2/9: Depression screen [facility] 2/9 07/07/2018 12/20/2017  Decreased Interest 0 0  Down, Depressed, Hopeless 0 1  PHQ - 2 Score 0 1  Altered sleeping 1 -  Tired, decreased energy 0 -  Change in appetite 1 -  Feeling bad or failure about yourself  0 -  Trouble concentrating 0 -  Moving slowly or fidgety/restless 1 -  Suicidal thoughts 0 -  PHQ-9 Score 3 -  Difficult doing work/chores Not difficult at all -  Some recent data might be hidden    Quality of Life:   Personal Goals: Goals established at orientation with interventions provided to work toward goal. Personal Goals and Risk Factors at Admission - 07/07/18 1436      Core Components/Risk Factors/Patient Goals on Admission  Weight Management  Yes;Weight Loss    Intervention  Weight Management: Develop a combined nutrition and exercise program designed to reach desired caloric intake, while maintaining appropriate intake of nutrient and fiber, sodium and fats, and appropriate energy expenditure required for the weight goal.;Weight Management: Provide education and appropriate resources to help participant work on and attain dietary goals.;Weight Management/Obesity: Establish reasonable short term and long term weight goals.;Obesity: Provide education and appropriate resources to help participant work on and attain dietary goals.    Admit Weight  234 lb 2.1 oz (106.2 kg)    Goal Weight: Short Term  230 lb (104.3 kg)    Goal Weight: Long Term  225 lb (102.1 kg)    Expected Outcomes  Short Term: Continue to assess and modify interventions until short term weight is achieved;Long Term: Adherence to nutrition and physical activity/exercise program aimed toward attainment of  established weight goal;Weight Loss: Understanding of general recommendations for a balanced deficit meal plan, which promotes 1-2 lb weight loss per week and includes a negative energy balance of [phone] kcal/d;Understanding recommendations for meals to include 15-35% energy as protein, 25-35% energy from fat, 35-60% energy from carbohydrates, less than 200mg  of dietary cholesterol, 20-35 gm of total fiber daily;Understanding of distribution of calorie intake throughout the day with the consumption of 4-5 meals/snacks    Tobacco Cessation  Yes    Number of packs per day  1/ 2 pack day    Intervention  Assist the participant in steps to quit. Provide individualized education and counseling about committing to Tobacco Cessation, relapse prevention, and pharmacological support that can be provided by physician.;Advice worker, assist with locating and accessing local/national Quit Smoking programs, and support quit date choice.    Expected Outcomes  Short Term: Will demonstrate readiness to quit, by selecting a quit date.;Short Term: Will quit all tobacco product use, adhering to prevention of relapse plan.;Long Term: Complete abstinence from all tobacco products for at least 12 months from quit date.    Improve shortness of breath with ADL's  Yes    Intervention  Provide education, individualized exercise plan and daily activity instruction to help decrease symptoms of SOB with activities of daily living.    Expected Outcomes  Short Term: Improve cardiorespiratory fitness to achieve a reduction of symptoms when performing ADLs;Long Term: Be able to perform more ADLs without symptoms or delay the onset of symptoms    Heart Failure  Yes    Intervention  Provide a combined exercise and nutrition program that is supplemented with education, support and counseling about heart failure. Directed toward relieving symptoms such as shortness of breath, decreased exercise tolerance, and extremity edema.     Expected Outcomes  Improve functional capacity of life;Short term: Attendance in program 2-3 days a week with increased exercise capacity. Reported lower sodium intake. Reported increased fruit and vegetable intake. Reports medication compliance.;Short term: Daily weights obtained and reported for increase. Utilizing diuretic protocols set by physician.;Long term: Adoption of self-care skills and reduction of barriers for early signs and symptoms recognition and intervention leading to self-care maintenance.        Personal Goals Discharge: Goals and Risk Factor Review    Row Name 08/12/18 2240 08/12/18 2241 10/10/18 1829         Core Components/Risk Factors/Patient Goals Review   Personal Goals Review  Tobacco Cessation;Weight Management/Obesity;Improve shortness of breath with ADL's;Increase knowledge of respiratory medications and ability to use respiratory devices properly.;Heart Failure  -  Tobacco Cessation;Weight  Management/Obesity;Improve shortness of breath with ADL's;Increase knowledge of respiratory medications and ability to use respiratory devices properly.;Heart Failure     Review  -  Pt has completed 4 exercise session.  Pt with 1.6 kg. Unable to assess adequately pt goals.  Pt is presently hospitalized for CHF and is receiving physical therapyl.  As pt nears discharge will be able to determine if home healthy PT should be completed first before retutrning to pulmonary rehab.  Pt discharged with the completion of 4 exercise sessions.     Expected Outcomes  -  See Admission  Goals/Outcomes  Goals unmet due to short period of time for participation in pulmonary rehab.        Exercise Goals and Review: Exercise Goals    Row Name 07/07/18 1431             Exercise Goals   Increase Physical Activity  Yes       Intervention  Provide advice, education, support and counseling about physical activity/exercise needs.;Develop an individualized exercise prescription for aerobic and  resistive training based on initial evaluation findings, risk stratification, comorbidities and participant's personal goals.       Expected Outcomes  Short Term: Attend rehab on a regular basis to increase amount of physical activity.;Long Term: Add in home exercise to make exercise part of routine and to increase amount of physical activity.;Long Term: Exercising regularly at least 3-5 days a week.       Increase Strength and Stamina  Yes       Intervention  Provide advice, education, support and counseling about physical activity/exercise needs.;Develop an individualized exercise prescription for aerobic and resistive training based on initial evaluation findings, risk stratification, comorbidities and participant's personal goals.       Expected Outcomes  Short Term: Increase workloads from initial exercise prescription for resistance, speed, and METs.;Short Term: Perform resistance training exercises routinely during rehab and add in resistance training at home;Long Term: Improve cardiorespiratory fitness, muscular endurance and strength as measured by increased METs and functional capacity (6MWT)       Able to understand and use rate of perceived exertion (RPE) scale  Yes       Intervention  Provide education and explanation on how to use RPE scale       Expected Outcomes  Short Term: Able to use RPE daily in rehab to express subjective intensity level;Long Term:  Able to use RPE to guide intensity level when exercising independently       Able to understand and use Dyspnea scale  Yes       Intervention  Provide education and explanation on how to use Dyspnea scale       Expected Outcomes  Short Term: Able to use Dyspnea scale daily in rehab to express subjective sense of shortness of breath during exertion;Long Term: Able to use Dyspnea scale to guide intensity level when exercising independently       Knowledge and understanding of Target Heart Rate Range (THRR)  Yes       Intervention  Provide  education and explanation of THRR including how the numbers were predicted and where they are located for reference       Expected Outcomes  Short Term: Able to state/look up THRR;Short Term: Able to use daily as guideline for intensity in rehab;Long Term: Able to use THRR to govern intensity when exercising independently       Understanding of Exercise Prescription  Yes  Intervention  Provide education, explanation, and written materials on patient's individual exercise prescription       Expected Outcomes  Short Term: Able to explain program exercise prescription;Long Term: Able to explain home exercise prescription to exercise independently          Exercise Goals Re-Evaluation: Exercise Goals Re-Evaluation    Row Name 08/10/18 0809             Exercise Goal Re-Evaluation   Exercise Goals Review  Increase Physical Activity;Increase Strength and Stamina;Able to understand and use rate of perceived exertion (RPE) scale;Able to understand and use Dyspnea scale;Knowledge and understanding of Target Heart Rate Range (THRR);Understanding of Exercise Prescription       Comments  Patient has only attended 4 rehab sessions. Is currently admitted in the hopsital for heart failure and respiratory distress.           Nutrition & Weight - Outcomes:    Nutrition: Nutrition Therapy & Goals - 07/07/18 1603      Nutrition Therapy   Diet  consisitent carbohydrate heart healthy      Personal Nutrition Goals   Nutrition Goal  Pt to identify and limit food sources of sodium, saturated fat, trans fats, refined carbohydrates.    Personal Goal #2  Identify food quantities necessary to achieve wt loss of  -2# per week to a goal wt loss of 2.7-10.9 kg (6-24 lb) at graduation from pulmonary rehab.    Personal Goal #3  The pt will recognize symptoms that can interfere with adequate oral intake      Intervention Plan   Intervention  Prescribe, educate and counsel regarding individualized specific  dietary modifications aiming towards targeted core components such as weight, hypertension, lipid management, diabetes, heart failure and other comorbidities.    Expected Outcomes  Short Term Goal: Understand basic principles of dietary content, such as calories, fat, sodium, cholesterol and nutrients.       Nutrition Discharge: Nutrition Assessments - 10/01/18 1435      Rate Your Plate Scores   Pre Score  46    Post Score  --   pt did not return survey      Education Questionnaire Score: Knowledge Questionnaire Score - 07/28/18 1454      Knowledge Questionnaire Score   Pre Score  12/18       Cordarro Spinnato Wilber Oliphant RN, BSN Cardiac and Pulmonary Rehab Nurse Navigator

## 2018-10-11 ENCOUNTER — Encounter (HOSPITAL_COMMUNITY): Payer: Medicare HMO

## 2018-10-11 DIAGNOSIS — M170 Bilateral primary osteoarthritis of knee: Secondary | ICD-10-CM | POA: Diagnosis not present

## 2018-10-11 DIAGNOSIS — Z8673 Personal history of transient ischemic attack (TIA), and cerebral infarction without residual deficits: Secondary | ICD-10-CM | POA: Diagnosis not present

## 2018-10-11 DIAGNOSIS — I2510 Atherosclerotic heart disease of native coronary artery without angina pectoris: Secondary | ICD-10-CM | POA: Diagnosis not present

## 2018-10-11 DIAGNOSIS — J9611 Chronic respiratory failure with hypoxia: Secondary | ICD-10-CM | POA: Diagnosis not present

## 2018-10-11 DIAGNOSIS — I5042 Chronic combined systolic (congestive) and diastolic (congestive) heart failure: Secondary | ICD-10-CM | POA: Diagnosis not present

## 2018-10-11 DIAGNOSIS — N184 Chronic kidney disease, stage 4 (severe): Secondary | ICD-10-CM | POA: Diagnosis not present

## 2018-10-11 DIAGNOSIS — I480 Paroxysmal atrial fibrillation: Secondary | ICD-10-CM | POA: Diagnosis not present

## 2018-10-11 DIAGNOSIS — I130 Hypertensive heart and chronic kidney disease with heart failure and stage 1 through stage 4 chronic kidney disease, or unspecified chronic kidney disease: Secondary | ICD-10-CM | POA: Diagnosis not present

## 2018-10-11 DIAGNOSIS — Z9981 Dependence on supplemental oxygen: Secondary | ICD-10-CM | POA: Diagnosis not present

## 2018-10-12 ENCOUNTER — Encounter (HOSPITAL_COMMUNITY): Payer: Medicare HMO

## 2018-10-12 DIAGNOSIS — J9611 Chronic respiratory failure with hypoxia: Secondary | ICD-10-CM | POA: Diagnosis not present

## 2018-10-12 DIAGNOSIS — Z8673 Personal history of transient ischemic attack (TIA), and cerebral infarction without residual deficits: Secondary | ICD-10-CM | POA: Diagnosis not present

## 2018-10-12 DIAGNOSIS — M170 Bilateral primary osteoarthritis of knee: Secondary | ICD-10-CM | POA: Diagnosis not present

## 2018-10-12 DIAGNOSIS — I480 Paroxysmal atrial fibrillation: Secondary | ICD-10-CM | POA: Diagnosis not present

## 2018-10-12 DIAGNOSIS — I130 Hypertensive heart and chronic kidney disease with heart failure and stage 1 through stage 4 chronic kidney disease, or unspecified chronic kidney disease: Secondary | ICD-10-CM | POA: Diagnosis not present

## 2018-10-12 DIAGNOSIS — Z9981 Dependence on supplemental oxygen: Secondary | ICD-10-CM | POA: Diagnosis not present

## 2018-10-12 DIAGNOSIS — N184 Chronic kidney disease, stage 4 (severe): Secondary | ICD-10-CM | POA: Diagnosis not present

## 2018-10-12 DIAGNOSIS — I2510 Atherosclerotic heart disease of native coronary artery without angina pectoris: Secondary | ICD-10-CM | POA: Diagnosis not present

## 2018-10-12 DIAGNOSIS — I5042 Chronic combined systolic (congestive) and diastolic (congestive) heart failure: Secondary | ICD-10-CM | POA: Diagnosis not present

## 2018-10-13 ENCOUNTER — Ambulatory Visit (HOSPITAL_COMMUNITY)
Admission: RE | Admit: 2018-10-13 | Discharge: 2018-10-13 | Disposition: A | Discharge status: Home / Self Care | Payer: Medicare HMO | Source: Ambulatory Visit | Attending: Internal Medicine | Admitting: Internal Medicine

## 2018-10-13 DIAGNOSIS — I5022 Chronic systolic (congestive) heart failure: Secondary | ICD-10-CM | POA: Diagnosis not present

## 2018-10-13 LAB — BASIC METABOLIC PANEL
Anion gap: 9 (ref 5–15)
BUN: 55 mg/dL — AB (ref 8–23)
CALCIUM: 9.8 mg/dL (ref 8.9–10.3)
CO2: 30 mmol/L (ref 22–32)
CREATININE: 2.58 mg/dL — AB (ref 0.44–1.00)
Chloride: 96 mmol/L — ABNORMAL LOW (ref 98–111)
GFR calc Af Amer: 21 mL/min — ABNORMAL LOW (ref >60)
GFR, EST NON AFRICAN AMERICAN: 18 mL/min — AB (ref >60)
GLUCOSE: 97 mg/dL (ref 70–99)
Potassium: 3.6 mmol/L (ref 3.5–5.1)
Sodium: 135 mmol/L (ref 135–145)

## 2018-10-14 ENCOUNTER — Encounter (HOSPITAL_COMMUNITY): Payer: Medicare HMO

## 2018-10-18 ENCOUNTER — Ambulatory Visit (HOSPITAL_COMMUNITY)
Admission: RE | Admit: 2018-10-18 | Discharge: 2018-10-18 | Disposition: A | Discharge status: Home / Self Care | Payer: Medicare HMO | Source: Ambulatory Visit | Attending: Nephrology | Admitting: Nephrology

## 2018-10-18 VITALS — BP 111/71 | HR 57 | Temp 98.2°F | Resp 20

## 2018-10-18 DIAGNOSIS — N170 Acute kidney failure with tubular necrosis: Secondary | ICD-10-CM | POA: Insufficient documentation

## 2018-10-18 DIAGNOSIS — N184 Chronic kidney disease, stage 4 (severe): Secondary | ICD-10-CM

## 2018-10-18 LAB — POCT HEMOGLOBIN-HEMACUE: Hemoglobin: 12.2 g/dL (ref 12.0–15.0)

## 2018-10-18 MED ORDER — EPOETIN ALFA 10000 UNIT/ML IJ SOLN: 5000.0000 [IU] | INTRAMUSCULAR | Status: DC

## 2018-10-19 ENCOUNTER — Encounter (HOSPITAL_COMMUNITY): Payer: Medicare HMO

## 2018-10-19 DIAGNOSIS — R339 Retention of urine, unspecified: Secondary | ICD-10-CM | POA: Diagnosis not present

## 2018-10-25 ENCOUNTER — Encounter (HOSPITAL_COMMUNITY): Payer: Medicare HMO

## 2018-10-25 DIAGNOSIS — R339 Retention of urine, unspecified: Secondary | ICD-10-CM | POA: Diagnosis not present

## 2018-10-25 NOTE — Progress Notes (Signed)
Electrophysiology Office Note   Date:  10/26/2018   ID:  Patricia Wagner, Patricia Wagner 03/12/1951, MRN [id number]  PCP:  Biagio Borg, MD  Cardiologist:  [place] Primary Electrophysiologist:  Wakisha Alberts Meredith Leeds, MD    No chief complaint on file.    History of Present Illness: Patricia Wagner is a 67 y.o. female who is being seen today for the evaluation of CHF, atrial fibrillation at the request of Biagio Borg, MD. Presenting today for electrophysiology evaluation. She has a history of persistent atrial fibrillation, nonischemic cardiomyopathy, CHF, hypertension, and hyperlipidemia. She has had atrial fibrillation since December 2017. She is had multiple cardioversions and has been loaded on amiodarone. She was admitted to the hospital in June 2018 in cardiogenic shock in the setting of persistent atrial fibrillation. EF was found to be 15% with severe bi-V dysfunction. She was started on amiodarone. She was successfully cardioverted to sinus rhythm after initiation of IV amiodarone.Since that time she has done well without recurrence of atrial fibrillation.  Today, denies symptoms of palpitations, chest pain, shortness of breath, orthopnea, PND, lower extremity edema, claudication, dizziness, presyncope, syncope, bleeding, or neurologic sequela. The patient is tolerating medications without difficulties.  She is overall feeling well.  She did have a hospitalization in September where she had slurred speech.  She was not noted to have had a stroke.  Since that time she is done well without major complaint.   Past Medical History:  Diagnosis Date  . Acute blood loss anemia 03/05/2018  . Acute hypercapnic respiratory failure ([place]) 04/30/2017  . Acute renal failure superimposed on chronic kidney disease ([place]) 10/20/2016  . Arthritis   . Atrial fibrillation ([place])    a. s/p multiple cardioversions; failed tikosyn/sotalol.  . Bradycardia 11/20/2016  . CAD in native artery, s/p cardiac cath with non  obstructive CAD 10/24/2016  . Cerebrovascular accident (CVA) due to embolism of left cerebellar artery ([place])   . CHF (congestive heart failure) ([place])   . Chronic anticoagulation 03/15/2018  . Chronic atrial fibrillation 03/05/2018  . Chronic systolic (congestive) heart failure ([place]) 03/05/2018  . CKD (chronic kidney disease) stage 3, GFR 30-59 ml/min (HCC)   . Coagulopathy ([place]) 03/05/2018  . DIVERTICULITIS, HX OF 07/25/2007  . Diverticulosis of colon with hemorrhage 07/22/2007   Qualifier: Diagnosis of  By: [name]    . DIVERTICULOSIS, COLON 07/22/2007  . Dyspnea   . Dysuria 07/21/2017  . Edema, peripheral    a. chronic BLE edema, R>L. Prior trauma from dog attack and accident.  . Essential hypertension 07/22/2007   Qualifier: Diagnosis of  By: [name]    . HLD (hyperlipidemia) 02/03/2008   Qualifier: Diagnosis of  By: [name], [name]   . HYPERLIPIDEMIA 02/03/2008  . Hypersomnia    declines w/u  . Hypertension   . Hypotension (arterial) 04/30/2017  . MENOPAUSAL DISORDER 01/09/2011  . Morbid obesity ([place]) 07/22/2007  . NICM (nonischemic cardiomyopathy) ([place]) 10/24/2016  . PAF (paroxysmal atrial fibrillation) ([place])   . Raynaud's syndrome 07/22/2007  . Stroke ([place]) 2017  . Suspected sleep apnea 05/20/2017  . THYROID NODULE, RIGHT 01/04/2010  . VITAMIN D DEFICIENCY 01/09/2011   Qualifier: Diagnosis of  By: [name], [name]    Past Surgical History:  Procedure Laterality Date  . CARDIAC CATHETERIZATION N/A 10/23/2016   Procedure: Left Heart Cath and Coronary Angiography;  Surgeon: [name], MD;  Location: [facility];  Service: Cardiovascular;  Laterality: N/A;  . CARDIOVERSION  N/A 10/31/2016   Procedure: CARDIOVERSION;  Surgeon: [name], MD;  Location: [facility];  Service: Cardiovascular;  Laterality: N/A;  . CARDIOVERSION N/A 11/03/2016   Procedure: CARDIOVERSION;  Surgeon: [name], MD;  Location: [facility];  Service: Cardiovascular;   Laterality: N/A;  . CARDIOVERSION N/A 11/18/2016   Procedure: CARDIOVERSION;  Surgeon: [name], MD;  Location: [facility];  Service: Cardiovascular;  Laterality: N/A;  . CARDIOVERSION N/A 03/25/2017   Procedure: CARDIOVERSION;  Surgeon: [name], MD;  Location: [facility] ENDOSCOPY;  Service: Cardiovascular;  Laterality: N/A;  . CARDIOVERSION N/A 05/06/2017   Procedure: CARDIOVERSION;  Surgeon: [name], MD;  Location: [facility] ENDOSCOPY;  Service: Cardiovascular;  Laterality: N/A;  . CARDIOVERSION N/A 05/12/2017   Procedure: CARDIOVERSION;  Surgeon: [name], MD;  Location: [facility] ENDOSCOPY;  Service: Cardiovascular;  Laterality: N/A;  . COLONOSCOPY N/A 12/04/2017   Procedure: COLONOSCOPY;  Surgeon: [name], MD;  Location: [place];  Service: Endoscopy;  Laterality: N/A;  . COLONOSCOPY W/ POLYPECTOMY  02/2011   pan diverticulosis.  tubular adenoma without dysplasia on 5 mm sigmoid polyp.  Dr Fuller Plan.    Marland Kitchen PARTIAL HYSTERECTOMY     1 OVARY LEFT  . TEE WITHOUT CARDIOVERSION N/A 10/31/2016   Procedure: TRANSESOPHAGEAL ECHOCARDIOGRAM (TEE);  Surgeon: [name], MD;  Location: [facility] ENDOSCOPY;  Service: Cardiovascular;  Laterality: N/A;  . TEE WITHOUT CARDIOVERSION N/A 03/25/2017   Procedure: TRANSESOPHAGEAL ECHOCARDIOGRAM (TEE);  Surgeon: [name], MD;  Location: [facility] ENDOSCOPY;  Service: Cardiovascular;  Laterality: N/A;     Current Outpatient Medications  Medication Sig Dispense Refill  . amiodarone (PACERONE) 200 MG tablet Take 1 tablet (200 mg total) by mouth daily. 30 tablet 5  . apixaban (ELIQUIS) 5 MG TABS tablet Take 1 tablet (5 mg total) by mouth 2 (two) times daily. 60 tablet 11  . epoetin alfa (EPOGEN,PROCRIT) 19147 UNIT/ML injection Inject 5,000 Units into the skin once a week.    . potassium chloride SA (K-DUR,KLOR-CON) 20 MEQ tablet Take 2 tablets (40 mEq total) by mouth daily. 60 tablet 5  . ranolazine (RANEXA) 500 MG 12 hr tablet Take 1 tablet (500  mg total) by mouth 2 (two) times daily. 60 tablet 5  . rosuvastatin (CRESTOR) 10 MG tablet Take 1 tablet (10 mg total) by mouth daily. 30 tablet 5  . furosemide (LASIX) 80 MG tablet Take 1 tablet (80 mg total) by mouth daily for 2 days. 34 tablet 6  . hydrALAZINE (APRESOLINE) 25 MG tablet Take 0.5 tablets (12.5 mg total) by mouth 3 (three) times daily. 45 tablet 5  . isosorbide mononitrate (IMDUR) 30 MG 24 hr tablet Take 0.5 tablets (15 mg total) by mouth daily. 15 tablet 5   No current facility-administered medications for this visit.     Allergies:   Ace inhibitors   Social History:  The patient  reports that she has quit smoking. Her smoking use included cigarettes. She started smoking about 11 months ago. She smoked 0.50 packs per day. She has never used smokeless tobacco. She reports that she does not drink alcohol or use drugs.   Family History:  The patient's family history includes Asthma in her mother; Diabetes in her father; Heart disease in her father and sister; Lung disease in her sister.   ROS:  Please see the history of present illness.   Otherwise, review of systems is positive for none.   All other systems are reviewed and negative.  PHYSICAL EXAM: VS:  BP 116/72   Pulse 73   Ht 5\' 5"  (1.651 m)   Wt 194 lb 9.6 oz (88.3 kg)   SpO2 99%   BMI 32.38 kg/m  , BMI Body mass index is 32.38 kg/m. GEN: Well nourished, well developed, in no acute distress  HEENT: normal  Neck: no JVD, carotid bruits, or masses Cardiac: RRR; no murmurs, rubs, or gallops,no edema  Respiratory:  clear to auscultation bilaterally, normal work of breathing GI: soft, nontender, nondistended, + BS MS: no deformity or atrophy  Skin: warm and dry Neuro:  Strength and sensation are intact Psych: euthymic mood, full affect  EKG:  EKG is ordered today. Personal review of the ekg ordered shows SR, LAE, LVH   Recent Labs: 08/05/2018: B Natriuretic Peptide 3,951.4 08/16/2018: Magnesium 2.2 08/30/2018:  Platelets 243.0 10/05/2018: ALT 19; TSH 0.880 10/13/2018: BUN 55; Creatinine, Ser 2.58; Potassium 3.6; Sodium 135 10/18/2018: Hemoglobin 12.2    Lipid Panel     Component Value Date/Time   CHOL 93 08/06/2018 0614   TRIG 78 08/06/2018 0614   HDL 34 (L) 08/06/2018 0614   CHOLHDL 2.7 08/06/2018 0614   VLDL 16 08/06/2018 0614   LDLCALC 43 08/06/2018 0614   LDLDIRECT 121.6 03/15/2012 1646     Wt Readings from Last 3 Encounters:  10/26/18 194 lb 9.6 oz (88.3 kg)  10/05/18 193 lb 6.4 oz (87.7 kg)  08/30/18 203 lb (92.1 kg)      Other studies Reviewed: Additional studies/ records that were reviewed today include: TTE 03/06/18  Review of the above records today demonstrates:  - Left ventricle: The cavity size was normal. There was severe   concentric hypertrophy. Systolic function was mildly to   moderately reduced. The estimated ejection fraction was in the   range of 40% to 45%. Mild diffuse hypokinesis with no   identifiable regional variations. Doppler parameters are   consistent with abnormal left ventricular relaxation (grade 1   diastolic dysfunction). - Aortic valve: There was mild stenosis. Valve area (VTI): 1.5   cm^2. Valve area (Vmax): 1.59 cm^2. Valve area (Vmean): 1.4 cm^2. - Mitral valve: Calcified annulus. - Left atrium: The atrium was severely dilated. - Right atrium: The atrium was moderately dilated. - Pericardium, extracardiac: There was a left pleural effusion.  LHC 10/23/16 1. Mild, non-obstructive coronary artery disease consistent with non-ischemic cardiomyopathy. 2. Mildly elevated left ventricular filling pressure.   ASSESSMENT AND PLAN:  1.  Persistent atrial fibrillation: Currently on Eliquis and amiodarone.  She is tolerating the amiodarone without issue.  Recent labs showed no major abnormality.  I feel that if she needs ablation, she Tekisha Darcey need both antiarrhythmics and ablation to keep her in normal rhythm.  She is not excited about ablation at  this point, so we Kla Bily hold off.    This patients CHA2DS2-VASc Score and unadjusted Ischemic Stroke Rate (% per year) is equal to 3.2 % stroke rate/year from a score of 3  Above score calculated as 1 point each if present [CHF, HTN, DM, Vascular=MI/PAD/Aortic Plaque, Age if 65-74, or Female] Above score calculated as 2 points each if present [Age > 75, or Stroke/TIA/TE]   2. Nonischemic cardiomyopathy: Ejection fraction has improved to 40 to 45%.  No changes.  Current medicines are reviewed at length with the patient today.   The patient does not have concerns regarding her medicines.  The following changes were made today: None  Labs/ tests ordered today include:  No orders of  the defined types were placed in this encounter.    Disposition:   FU with Cailee Blanke 6 months  Signed, Petr Bontempo Meredith Leeds, MD  10/26/2018 12:41 PM     [address] [place] [address] [phone] (office) [phone] (fax)

## 2018-10-26 ENCOUNTER — Ambulatory Visit (INDEPENDENT_AMBULATORY_CARE_PROVIDER_SITE_OTHER): Payer: Medicare HMO | Admitting: Cardiology

## 2018-10-26 ENCOUNTER — Encounter: Payer: Self-pay | Admitting: Cardiology

## 2018-10-26 VITALS — BP 116/72 | HR 73 | Ht 65.0 in | Wt 194.6 lb

## 2018-10-26 DIAGNOSIS — I4819 Other persistent atrial fibrillation: Secondary | ICD-10-CM

## 2018-10-26 DIAGNOSIS — I428 Other cardiomyopathies: Secondary | ICD-10-CM | POA: Diagnosis not present

## 2018-10-26 NOTE — Patient Instructions (Signed)
Medication Instructions:  Your physician recommends that you continue on your current medications as directed. Please refer to the Current Medication list given to you today.  * If you need a refill on your cardiac medications before your next appointment, please call your pharmacy.   Labwork: None ordered  Testing/Procedures: None ordered  Follow-Up: Your physician wants you to follow-up in: 6 months with Dr. Camnitz.  You will receive a reminder letter in the mail two months in advance. If you don't receive a letter, please call our office to schedule the follow-up appointment.  *Please note that any paperwork needing to be filled out by the provider will need to be addressed at the front desk prior to seeing the provider. Please note that any FMLA, disability or other documents regarding health condition is subject to a $25.00 charge that must be received prior to completion of paperwork in the form of a money order or check.  Thank you for choosing [facility]!!   Eriona Kinchen, RN [phone]        

## 2018-10-26 NOTE — Addendum Note (Signed)
Addended by: Rose Phi on: 10/26/2018 04:20 PM   Modules accepted: Orders

## 2018-10-29 ENCOUNTER — Ambulatory Visit (HOSPITAL_COMMUNITY)
Admission: RE | Admit: 2018-10-29 | Discharge: 2018-10-29 | Disposition: A | Discharge status: Home / Self Care | Payer: Medicare HMO | Source: Ambulatory Visit | Attending: Cardiology | Admitting: Cardiology

## 2018-10-29 DIAGNOSIS — I5022 Chronic systolic (congestive) heart failure: Secondary | ICD-10-CM

## 2018-10-29 LAB — BASIC METABOLIC PANEL
Anion gap: 11 (ref 5–15)
BUN: 44 mg/dL — ABNORMAL HIGH (ref 8–23)
CO2: 31 mmol/L (ref 22–32)
Calcium: 9 mg/dL (ref 8.9–10.3)
Chloride: 99 mmol/L (ref 98–111)
Creatinine, Ser: 2.65 mg/dL — ABNORMAL HIGH (ref 0.44–1.00)
GFR calc Af Amer: 21 mL/min — ABNORMAL LOW (ref >60)
GFR calc non Af Amer: 18 mL/min — ABNORMAL LOW (ref >60)
Glucose, Bld: 73 mg/dL (ref 70–99)
Potassium: 4 mmol/L (ref 3.5–5.1)
Sodium: 141 mmol/L (ref 135–145)

## 2018-11-01 ENCOUNTER — Ambulatory Visit (HOSPITAL_COMMUNITY)
Admission: RE | Admit: 2018-11-01 | Discharge: 2018-11-01 | Disposition: A | Discharge status: Home / Self Care | Payer: Medicare HMO | Source: Ambulatory Visit | Attending: Nephrology | Admitting: Nephrology

## 2018-11-01 VITALS — BP 111/75 | HR 61 | Resp 20

## 2018-11-01 DIAGNOSIS — N170 Acute kidney failure with tubular necrosis: Secondary | ICD-10-CM | POA: Insufficient documentation

## 2018-11-01 DIAGNOSIS — N184 Chronic kidney disease, stage 4 (severe): Secondary | ICD-10-CM

## 2018-11-01 LAB — FERRITIN: Ferritin: 79 ng/mL (ref 11–307)

## 2018-11-01 LAB — IRON AND TIBC
Iron: 79 ug/dL (ref 28–170)
Saturation Ratios: 28 % (ref 10.4–31.8)
TIBC: 279 ug/dL (ref 250–450)
UIBC: 200 ug/dL

## 2018-11-01 LAB — POCT HEMOGLOBIN-HEMACUE: Hemoglobin: 13.1 g/dL (ref 12.0–15.0)

## 2018-11-01 MED ORDER — EPOETIN ALFA 10000 UNIT/ML IJ SOLN: 5000.0000 [IU] | INTRAMUSCULAR | Status: DC

## 2018-11-05 ENCOUNTER — Telehealth (HOSPITAL_COMMUNITY): Payer: Self-pay | Admitting: Cardiology

## 2018-11-05 DIAGNOSIS — I5022 Chronic systolic (congestive) heart failure: Secondary | ICD-10-CM

## 2018-11-05 NOTE — Telephone Encounter (Signed)
-----   Message from Conrad Hancock, NP sent at 11/04/2018  1:14 PM EST ----- Please call hold lasix for 2 days the start lasix 80 mg daily. Repeat BMET next week.

## 2018-11-05 NOTE — Telephone Encounter (Signed)
Notes recorded by Kerry Dory, CMA on 11/05/2018 at 2:20 PM EST Patient aware. Patient voiced understanding   ------  Notes recorded by Kerry Dory, CMA on 11/04/2018 at 2:31 PM EST Left message for patient to call back. [phone] (M) ------  Notes recorded by Darrick Grinder D, NP on 11/04/2018 at 1:14 PM EST Please call hold lasix for 2 days the start lasix 80 mg daily. Repeat BMET next week.

## 2018-11-08 ENCOUNTER — Encounter (HOSPITAL_COMMUNITY): Payer: Medicare HMO

## 2018-11-15 ENCOUNTER — Encounter (HOSPITAL_COMMUNITY)
Admission: RE | Admit: 2018-11-15 | Discharge: 2018-11-15 | Disposition: A | Discharge status: Home / Self Care | Payer: Medicare HMO | Source: Ambulatory Visit | Attending: Nephrology | Admitting: Nephrology

## 2018-11-15 VITALS — BP 113/66 | HR 65 | Temp 99.0°F | Resp 20

## 2018-11-15 DIAGNOSIS — N184 Chronic kidney disease, stage 4 (severe): Secondary | ICD-10-CM

## 2018-11-15 DIAGNOSIS — N183 Chronic kidney disease, stage 3 (moderate): Secondary | ICD-10-CM | POA: Diagnosis not present

## 2018-11-15 DIAGNOSIS — D631 Anemia in chronic kidney disease: Secondary | ICD-10-CM | POA: Insufficient documentation

## 2018-11-15 DIAGNOSIS — N170 Acute kidney failure with tubular necrosis: Secondary | ICD-10-CM

## 2018-11-15 LAB — POCT HEMOGLOBIN-HEMACUE: Hemoglobin: 12.3 g/dL (ref 12.0–15.0)

## 2018-11-15 MED ORDER — EPOETIN ALFA 10000 UNIT/ML IJ SOLN: 5000.0000 [IU] | INTRAMUSCULAR | Status: DC

## 2018-11-18 ENCOUNTER — Other Ambulatory Visit (HOSPITAL_COMMUNITY): Payer: Medicare HMO

## 2018-11-18 DIAGNOSIS — R339 Retention of urine, unspecified: Secondary | ICD-10-CM | POA: Diagnosis not present

## 2018-11-22 ENCOUNTER — Encounter (HOSPITAL_COMMUNITY): Payer: Medicare HMO

## 2018-11-22 ENCOUNTER — Encounter (HOSPITAL_COMMUNITY)
Admission: RE | Admit: 2018-11-22 | Discharge: 2018-11-22 | Disposition: A | Discharge status: Home / Self Care | Payer: Medicare HMO | Source: Ambulatory Visit | Attending: Nephrology | Admitting: Nephrology

## 2018-11-22 VITALS — BP 104/73 | HR 66 | Temp 98.8°F | Resp 20

## 2018-11-22 DIAGNOSIS — D631 Anemia in chronic kidney disease: Secondary | ICD-10-CM | POA: Diagnosis not present

## 2018-11-22 DIAGNOSIS — N183 Chronic kidney disease, stage 3 (moderate): Secondary | ICD-10-CM | POA: Diagnosis not present

## 2018-11-22 DIAGNOSIS — N184 Chronic kidney disease, stage 4 (severe): Secondary | ICD-10-CM

## 2018-11-22 DIAGNOSIS — N170 Acute kidney failure with tubular necrosis: Secondary | ICD-10-CM

## 2018-11-22 LAB — POCT HEMOGLOBIN-HEMACUE: Hemoglobin: 13.4 g/dL (ref 12.0–15.0)

## 2018-11-22 MED ORDER — EPOETIN ALFA 10000 UNIT/ML IJ SOLN: 5000.0000 [IU] | INTRAMUSCULAR | Status: DC

## 2018-11-29 ENCOUNTER — Encounter (HOSPITAL_COMMUNITY): Payer: Medicare HMO

## 2018-12-06 ENCOUNTER — Ambulatory Visit (HOSPITAL_COMMUNITY)
Admission: RE | Admit: 2018-12-06 | Discharge: 2018-12-06 | Disposition: A | Discharge status: Home / Self Care | Payer: Medicare HMO | Source: Ambulatory Visit | Attending: Nephrology | Admitting: Nephrology

## 2018-12-06 VITALS — BP 118/71 | HR 63 | Temp 98.4°F | Resp 20

## 2018-12-06 DIAGNOSIS — N170 Acute kidney failure with tubular necrosis: Secondary | ICD-10-CM | POA: Diagnosis not present

## 2018-12-06 DIAGNOSIS — N184 Chronic kidney disease, stage 4 (severe): Secondary | ICD-10-CM

## 2018-12-06 LAB — IRON AND TIBC
Iron: 86 ug/dL (ref 28–170)
Saturation Ratios: 28 % (ref 10.4–31.8)
TIBC: 308 ug/dL (ref 250–450)
UIBC: 222 ug/dL

## 2018-12-06 LAB — FERRITIN: FERRITIN: 102 ng/mL (ref 11–307)

## 2018-12-06 LAB — POCT HEMOGLOBIN-HEMACUE: HEMOGLOBIN: 13.1 g/dL (ref 12.0–15.0)

## 2018-12-06 MED ORDER — EPOETIN ALFA 10000 UNIT/ML IJ SOLN: 5000.0000 [IU] | INTRAMUSCULAR | Status: DC

## 2018-12-13 ENCOUNTER — Encounter (HOSPITAL_COMMUNITY): Payer: Medicare HMO

## 2018-12-20 ENCOUNTER — Ambulatory Visit (HOSPITAL_COMMUNITY)
Admission: RE | Admit: 2018-12-20 | Discharge: 2018-12-20 | Disposition: A | Discharge status: Home / Self Care | Payer: Medicare HMO | Source: Ambulatory Visit | Attending: Nephrology | Admitting: Nephrology

## 2018-12-20 VITALS — BP 93/52 | HR 65

## 2018-12-20 DIAGNOSIS — N184 Chronic kidney disease, stage 4 (severe): Secondary | ICD-10-CM | POA: Diagnosis not present

## 2018-12-20 DIAGNOSIS — N170 Acute kidney failure with tubular necrosis: Secondary | ICD-10-CM | POA: Diagnosis not present

## 2018-12-20 LAB — POCT HEMOGLOBIN-HEMACUE: HEMOGLOBIN: 13.8 g/dL (ref 12.0–15.0)

## 2018-12-20 MED ORDER — EPOETIN ALFA 10000 UNIT/ML IJ SOLN: 5000.0000 [IU] | INTRAMUSCULAR | Status: DC

## 2018-12-21 DIAGNOSIS — R339 Retention of urine, unspecified: Secondary | ICD-10-CM | POA: Diagnosis not present

## 2018-12-27 ENCOUNTER — Encounter (HOSPITAL_COMMUNITY): Payer: Medicare HMO

## 2019-01-03 ENCOUNTER — Encounter (HOSPITAL_COMMUNITY): Payer: Medicare HMO

## 2019-01-07 ENCOUNTER — Ambulatory Visit (HOSPITAL_COMMUNITY)
Admission: RE | Admit: 2019-01-07 | Discharge: 2019-01-07 | Disposition: A | Discharge status: Home / Self Care | Payer: Medicare HMO | Source: Ambulatory Visit | Attending: Internal Medicine | Admitting: Internal Medicine

## 2019-01-07 ENCOUNTER — Encounter (HOSPITAL_COMMUNITY): Payer: Self-pay | Admitting: Internal Medicine

## 2019-01-07 VITALS — BP 140/82 | HR 76 | Wt 204.0 lb

## 2019-01-07 DIAGNOSIS — I340 Nonrheumatic mitral (valve) insufficiency: Secondary | ICD-10-CM | POA: Insufficient documentation

## 2019-01-07 DIAGNOSIS — J90 Pleural effusion, not elsewhere classified: Secondary | ICD-10-CM | POA: Insufficient documentation

## 2019-01-07 DIAGNOSIS — N184 Chronic kidney disease, stage 4 (severe): Secondary | ICD-10-CM | POA: Insufficient documentation

## 2019-01-07 DIAGNOSIS — I480 Paroxysmal atrial fibrillation: Secondary | ICD-10-CM | POA: Diagnosis not present

## 2019-01-07 DIAGNOSIS — Z888 Allergy status to other drugs, medicaments and biological substances status: Secondary | ICD-10-CM | POA: Diagnosis not present

## 2019-01-07 DIAGNOSIS — E7800 Pure hypercholesterolemia, unspecified: Secondary | ICD-10-CM | POA: Diagnosis not present

## 2019-01-07 DIAGNOSIS — I428 Other cardiomyopathies: Secondary | ICD-10-CM | POA: Diagnosis not present

## 2019-01-07 DIAGNOSIS — J449 Chronic obstructive pulmonary disease, unspecified: Secondary | ICD-10-CM | POA: Insufficient documentation

## 2019-01-07 DIAGNOSIS — R29818 Other symptoms and signs involving the nervous system: Secondary | ICD-10-CM

## 2019-01-07 DIAGNOSIS — I482 Chronic atrial fibrillation, unspecified: Secondary | ICD-10-CM | POA: Insufficient documentation

## 2019-01-07 DIAGNOSIS — Z87891 Personal history of nicotine dependence: Secondary | ICD-10-CM | POA: Insufficient documentation

## 2019-01-07 DIAGNOSIS — Z7901 Long term (current) use of anticoagulants: Secondary | ICD-10-CM | POA: Diagnosis not present

## 2019-01-07 DIAGNOSIS — I130 Hypertensive heart and chronic kidney disease with heart failure and stage 1 through stage 4 chronic kidney disease, or unspecified chronic kidney disease: Secondary | ICD-10-CM | POA: Diagnosis not present

## 2019-01-07 DIAGNOSIS — E785 Hyperlipidemia, unspecified: Secondary | ICD-10-CM | POA: Insufficient documentation

## 2019-01-07 DIAGNOSIS — Z833 Family history of diabetes mellitus: Secondary | ICD-10-CM | POA: Insufficient documentation

## 2019-01-07 DIAGNOSIS — I5022 Chronic systolic (congestive) heart failure: Secondary | ICD-10-CM | POA: Diagnosis not present

## 2019-01-07 DIAGNOSIS — I2510 Atherosclerotic heart disease of native coronary artery without angina pectoris: Secondary | ICD-10-CM | POA: Insufficient documentation

## 2019-01-07 DIAGNOSIS — R339 Retention of urine, unspecified: Secondary | ICD-10-CM | POA: Diagnosis not present

## 2019-01-07 DIAGNOSIS — I440 Atrioventricular block, first degree: Secondary | ICD-10-CM | POA: Insufficient documentation

## 2019-01-07 DIAGNOSIS — D649 Anemia, unspecified: Secondary | ICD-10-CM | POA: Diagnosis not present

## 2019-01-07 DIAGNOSIS — Z8673 Personal history of transient ischemic attack (TIA), and cerebral infarction without residual deficits: Secondary | ICD-10-CM | POA: Insufficient documentation

## 2019-01-07 DIAGNOSIS — R338 Other retention of urine: Secondary | ICD-10-CM | POA: Insufficient documentation

## 2019-01-07 DIAGNOSIS — Z8719 Personal history of other diseases of the digestive system: Secondary | ICD-10-CM | POA: Diagnosis not present

## 2019-01-07 DIAGNOSIS — I10 Essential (primary) hypertension: Secondary | ICD-10-CM | POA: Diagnosis not present

## 2019-01-07 DIAGNOSIS — Z79899 Other long term (current) drug therapy: Secondary | ICD-10-CM | POA: Insufficient documentation

## 2019-01-07 DIAGNOSIS — Z8249 Family history of ischemic heart disease and other diseases of the circulatory system: Secondary | ICD-10-CM | POA: Insufficient documentation

## 2019-01-07 LAB — BASIC METABOLIC PANEL
ANION GAP: 7 (ref 5–15)
BUN: 35 mg/dL — ABNORMAL HIGH (ref 8–23)
CALCIUM: 9.3 mg/dL (ref 8.9–10.3)
CO2: 29 mmol/L (ref 22–32)
Chloride: 103 mmol/L (ref 98–111)
Creatinine, Ser: 1.91 mg/dL — ABNORMAL HIGH (ref 0.44–1.00)
GFR calc Af Amer: 31 mL/min — ABNORMAL LOW (ref >60)
GFR calc non Af Amer: 27 mL/min — ABNORMAL LOW (ref >60)
Glucose, Bld: 93 mg/dL (ref 70–99)
Potassium: 3.9 mmol/L (ref 3.5–5.1)
Sodium: 139 mmol/L (ref 135–145)

## 2019-01-07 MED ORDER — HYDRALAZINE HCL 25 MG PO TABS: 25.0000 mg | ORAL_TABLET | Freq: Three times a day (TID) | ORAL | 5 refills | Status: DC

## 2019-01-07 NOTE — Progress Notes (Signed)
Advanced Heart Failure Clinic Note   PCP: Dr Wille Glaser  Primary Cardiologist: Dr. [name] Primary HF: [name] Urologist:  Nephrology: Dr Justin Mend EP:  Dr Curt Bears  HPI: Patricia Wagner is a 68 y.o. female with a past medical history of paroxysmal atrial fibrillation, NICM cardiomyopathy, Biventricular CHF, HTN, chronic foley due to urinary retentin, CKD Stage III, and HLD.   She has struggled with symptomatic afib since 10/2016. Had successful DCCV 10/2016. Failed DCCV 03/2017, after stopping her amiodarone. Seen in A-Fib clinic 04/09/17 and planned for repeat DCCV after further amio load.   Pt admitted to [facility] 6/6 - 05/16/17 with cardiogenic shock in the setting of persistent AF. Had tach CM with EF 15% with severe biventricular dysfunction. Started on amio gtt and PICC line placed for dobutamine with low output HF. Underwent DCCV 05/06/17 but went back into Afib 05/08/17 and developed recurrent cardiogenic shock, requiring re-initiation of dobutamine and amio. Pt had repeat DCCV 05/12/17 with successful conversion to NSR. Discharged on amio and Ranexa.  Last echocardiogram August 2018 showed ejection fraction 30-35%,severe left ventricular hypertrophy, mild to moderate mitral regurgitation, biatrial enlargement and moderately reduced RV function. Fat pad bx negative. PYP 10/18 felt equivocal but not favored for amyloid. Patient has been scheduled for atrial fibrillation ablation January 2019 as she has had cardiogenic shock with atrial fibrillation previously.   Admitted 2/5 through 01/08/2018 with UTI and AKI. Found to have neurogenic bladder with urinary retention .Creatinine peaked at 7. Lasix, spiro, and entresto stopped. Required home IV antibiotics and discharged with foley. At the time of discharge creatinine was down to 1.07.   Admitted 03/05/18 and again on 4/22 with lower GI bleed. Initially was in setting of INR > 10, but transitioned from coumadin to Xarelto after first admission. Pt had  colonoscopy in 11/2017 which showed diverticular disease, so no repeat performed due to supratherapeutic INR. GI consulted. Tagged NM scan 03/15/18 with no active bleeding. Left off of AC for now.   Amyloid work up negative. Fat pad biopsy was negative. PYP scan negative.   Admitted 9/12 through 08/17/18. Hospital course complicated by acute hypoxic respiratory failure and a/c systolic/diastolic heart failure. Required short term intubation and diuresis with IV lasix. Discharged amiodarone taper. Discharge weight  was 215.8 pounds.   She presents today for regular follow up. Feeling good overall. Haven't smoked since she's been out of the hospital in September. Denies BRBPR or dark stools. Met with Dr. Curt Bears in December, plan to defer ablation consideration for now. Denies fever, cough, or chills. No lightheadedness or dizziness. Pushes a shopping cart around a store, and does OK. Taking all medications as directed.    ECHO 03/06/2018  Left ventricle: The cavity size was normal. There was severe   concentric hypertrophy. Systolic function was mildly to   moderately reduced. The estimated ejection fraction was in the   range of 40% to 45%. Mild diffuse hypokinesis with no   identifiable regional variations. Doppler parameters are   consistent with abnormal left ventricular relaxation (grade 1   diastolic dysfunction). - Aortic valve: There was mild stenosis. Valve area (VTI): 1.5   cm^2. Valve area (Vmax): 1.59 cm^2. Valve area (Vmean): 1.4 cm^2. - Mitral valve: Calcified annulus. - Left atrium: The atrium was severely dilated. - Right atrium: The atrium was moderately dilated. - Pericardium, extracardiac: There was a left pleural effusion.  Echo from 07/15/17 EF 30-35% Severe LVH. RV down.   Review of systems complete and found to be  negative unless listed in HPI.    Past Medical History:  Diagnosis Date  . Acute blood loss anemia 03/05/2018  . Acute hypercapnic respiratory failure ([place])  04/30/2017  . Acute renal failure superimposed on chronic kidney disease ([place]) 10/20/2016  . Arthritis   . Atrial fibrillation ([place])    a. s/p multiple cardioversions; failed tikosyn/sotalol.  . Bradycardia 11/20/2016  . CAD in native artery, s/p cardiac cath with non obstructive CAD 10/24/2016  . Cerebrovascular accident (CVA) due to embolism of left cerebellar artery ([place])   . CHF (congestive heart failure) ([place])   . Chronic anticoagulation 03/15/2018  . Chronic atrial fibrillation 03/05/2018  . Chronic systolic (congestive) heart failure ([place]) 03/05/2018  . CKD (chronic kidney disease) stage 3, GFR 30-59 ml/min (HCC)   . Coagulopathy ([place]) 03/05/2018  . DIVERTICULITIS, HX OF 07/25/2007  . Diverticulosis of colon with hemorrhage 07/22/2007   Qualifier: Diagnosis of  By: [name]    . DIVERTICULOSIS, COLON 07/22/2007  . Dyspnea   . Dysuria 07/21/2017  . Edema, peripheral    a. chronic BLE edema, R>L. Prior trauma from dog attack and accident.  . Essential hypertension 07/22/2007   Qualifier: Diagnosis of  By: [name]    . HLD (hyperlipidemia) 02/03/2008   Qualifier: Diagnosis of  By: [name], [name]   . HYPERLIPIDEMIA 02/03/2008  . Hypersomnia    declines w/u  . Hypertension   . Hypotension (arterial) 04/30/2017  . MENOPAUSAL DISORDER 01/09/2011  . Morbid obesity ([place]) 07/22/2007  . NICM (nonischemic cardiomyopathy) ([place]) 10/24/2016  . PAF (paroxysmal atrial fibrillation) ([place])   . Raynaud's syndrome 07/22/2007  . Stroke ([place]) 2017  . Suspected sleep apnea 05/20/2017  . THYROID NODULE, RIGHT 01/04/2010  . VITAMIN D DEFICIENCY 01/09/2011   Qualifier: Diagnosis of  By: [name], [name]     Current Outpatient Medications  Medication Sig Dispense Refill  . amiodarone (PACERONE) 200 MG tablet Take 1 tablet (200 mg total) by mouth daily. 30 tablet 5  . apixaban (ELIQUIS) 5 MG TABS tablet Take 1 tablet (5 mg total) by mouth 2 (two) times daily. 60 tablet 11  . furosemide (LASIX)  80 MG tablet Take 1 tablet (80 mg total) by mouth daily for 2 days. 34 tablet 6  . hydrALAZINE (APRESOLINE) 25 MG tablet Take 0.5 tablets (12.5 mg total) by mouth 3 (three) times daily. 45 tablet 5  . isosorbide mononitrate (IMDUR) 30 MG 24 hr tablet Take 0.5 tablets (15 mg total) by mouth daily. 15 tablet 5  . potassium chloride SA (K-DUR,KLOR-CON) 20 MEQ tablet Take 2 tablets (40 mEq total) by mouth daily. 60 tablet 5  . rosuvastatin (CRESTOR) 10 MG tablet Take 1 tablet (10 mg total) by mouth daily. 30 tablet 5  . epoetin alfa (EPOGEN,PROCRIT) 29244 UNIT/ML injection Inject 5,000 Units into the skin once a week.    . ranolazine (RANEXA) 500 MG 12 hr tablet Take 1 tablet (500 mg total) by mouth 2 (two) times daily. 60 tablet 5   No current facility-administered medications for this encounter.     Allergies  Allergen Reactions  . Ace Inhibitors Palpitations     Social History   Socioeconomic History  . Marital status: Single    Spouse name: Not on file  . Number of children: 1  . Years of education: Not on file  . Highest education level: Not on file  Occupational History  . Occupation: Engineer, manufacturing  . Emergency planning/management officer  strain: Not on file  . Food insecurity:    Worry: Not on file    Inability: Not on file  . Transportation needs:    Medical: Not on file    Non-medical: Not on file  Tobacco Use  . Smoking status: Former Smoker    Packs/day: 0.50    Types: Cigarettes    Start date: 2019  . Smokeless tobacco: Never Used  . Tobacco comment: restarted back this year  Substance and Sexual Activity  . Alcohol use: No    Alcohol/week: 4.0 standard drinks    Types: 4 Cans of beer per week    Frequency: Never    Comment: Intermittent  . Drug use: No  . Sexual activity: Not on file  Lifestyle  . Physical activity:    Days per week: Not on file    Minutes per session: Not on file  . Stress: Not on file  Relationships  . Social connections:    Talks on phone:  Not on file    Gets together: Not on file    Attends religious service: Not on file    Active member of club or organization: Not on file    Attends meetings of clubs or organizations: Not on file    Relationship status: Not on file  . Intimate partner violence:    Fear of current or ex partner: Not on file    Emotionally abused: Not on file    Physically abused: Not on file    Forced sexual activity: Not on file  Other Topics Concern  . Not on file  Social History Narrative  . Not on file     Family History  Problem Relation Age of Onset  . Asthma Mother   . Diabetes Father   . Heart disease Father        Died of presumed heart attack - 46s  . Lung disease Sister   . Heart disease Sister        Twin sister has heart issue, unclear what kind  . Thyroid disease Neg Hx     Vitals:   01/07/19 1350  BP: 140/82  Pulse: 76  SpO2: 92%  Weight: 92.5 kg (204 lb)   Wt Readings from Last 3 Encounters:  01/07/19 92.5 kg (204 lb)  10/26/18 88.3 kg (194 lb 9.6 oz)  10/05/18 87.7 kg (193 lb 6.4 oz)    PHYSICAL EXAM: General: NAD  HEENT: Normal Neck: Supple. JVP 7-8 cm. Carotids 2+ bilat; no bruits. No thyromegaly or nodule noted. Cor: PMI nondisplaced. RRR, No M/G/R appreciated Lungs: CTAB, normal effort. Abdomen: Soft, non-tender, non-distended, no HSM. No bruits or masses. +BS  Extremities: No cyanosis, clubbing, or rash. R and LLE no edema.  Neuro: Alert & orientedx3, cranial nerves grossly intact. moves all 4 extremities w/o difficulty. Affect pleasant   EKG shows NSR 64 bpm with 1st degree AV block and occasional PVC.   ASSESSMENT & PLAN:  1. Chronic biventricular CHF due to NICM (suspect AF cardiomyopathy) - Echo 02/2018: EF 40-45%, grade 1 DD, RV normal - Amyloid w/u negative with negative fat pad biopsy and PYP scan  - NYHA II-III symptoms. Not very active. - Volume status looks OK on exam.  - Continue lasix 80 mg daily.  - Increase hydralazine 25 mg TID.  -  Continue imdur 15 mg daily - She is unable to afford Bidil - Off spiro and Entresto with recent AKI.   2. H/o GI bleed - Denies recurrent  bleeding.  - Tagged NM scan 03/15/18 negative - Note per Dr. Hilarie Fredrickson 03/16/18 states likely Diverticular source.  - Continue eliquis 5 mg BID.   3. PAF:  Cannot tolerate AF with recurrent shock. S/p DCCV on 5/2 (she stopped Amio after this) and DCCV on 05/06/17 and again 05/12/17. - EKG shows NSR 64 bpm with 1st degree AV block and occasional PVC.  - She absolutely cannot tolerate AF with recurrent cardiogenic shock when AF recurs. She refused ablation.  - Continue Ranexa 500 mg BID.  - Continue amiodarone 200 mg daily.   - CHA2DS2/VASc is at least 7.  - Continue Eliquis 5 mg BID.  - TSH normal and LFTs normal 09/2018. She is aware she needs eye exam daily.   4. Suspected Sleep Apnea: - Refuses sleep study. No change.   5.  COPD:  - No wheeze on exam. No wheezing.  6. CKD IV - Creatinine peaked at 7 during recent hospitalization in February. - Avoid NSAIDs.  - Sees Dr. Justin Mend.  BMET today. Urinary retention as below.   7. HTN - Increase hydralazine as above.   8. Anemia - Hgb 13.8 12/20/2018.   9. Urinary retention - She follows with urology.  - Has a indwelling Foley without bag, that she can drain.   10. H/o CVA - On Eliquis as above. No chronic defecits. Did have facial droop in setting of admission for shock in 07/2018. MRI that admission with no acute abnormalities, but + small chronic infarctions in right occipital lobe and left superior cerebella hemisphere. + Mild chronic microvascular changes.   Doing well overall currently. CKD IV followed by [facility]. Urinary retention continues. Stressed importance of considering Afib ablation in the setting of her rapid and profound decompensations when she goes out of rhythm. She plans close EP follow up.   Patricia Friar, PA-C 01/07/19    Patient seen and examined with the above-signed  Advanced Practice Provider and/or Housestaff. I personally reviewed laboratory data, imaging studies and relevant notes. I independently examined the patient and formulated the important aspects of the plan. I have edited the note to reflect any of my changes or salient points. I have personally discussed the plan with the patient and/or family.  Doing very well in NSR. Last EF 40-45%. NYHA II. Saw Dr. Curt Bears and she is thinking about ablation. I encouraged her to proceed with ablation. Continue Eliquis. BP elevated - will increase hydralazine. Repeat Echo. Check labs today.    Glori Bickers, MD  3:15 PM

## 2019-01-07 NOTE — Patient Instructions (Signed)
Labs were done today. We will call you with any ABNORMAL results. No news is good news!  EKG was completed.   Your physician recommends an appointment with Electrophysiology, will call you to schedule that appointment with you.  INCREASE Hydralazine to 25mg  three times a day.   Your physician has requested that you have an echocardiogram. Echocardiography is a painless test that uses sound waves to create images of your heart. It provides your doctor with information about the size and shape of your heart and how well your heart's chambers and valves are working. This procedure takes approximately one hour. There are no restrictions for this procedure.  Your physician recommends that you schedule a follow-up appointment in: 3 months.

## 2019-01-10 ENCOUNTER — Telehealth: Payer: Self-pay | Admitting: *Deleted

## 2019-01-14 DIAGNOSIS — R338 Other retention of urine: Secondary | ICD-10-CM | POA: Diagnosis not present

## 2019-01-14 DIAGNOSIS — R109 Unspecified abdominal pain: Secondary | ICD-10-CM | POA: Diagnosis not present

## 2019-01-24 DIAGNOSIS — N189 Chronic kidney disease, unspecified: Secondary | ICD-10-CM | POA: Diagnosis not present

## 2019-01-24 DIAGNOSIS — N183 Chronic kidney disease, stage 3 (moderate): Secondary | ICD-10-CM | POA: Diagnosis not present

## 2019-02-08 DIAGNOSIS — R338 Other retention of urine: Secondary | ICD-10-CM | POA: Diagnosis not present

## 2019-02-08 DIAGNOSIS — N183 Chronic kidney disease, stage 3 (moderate): Secondary | ICD-10-CM | POA: Diagnosis not present

## 2019-02-09 ENCOUNTER — Telehealth: Payer: Self-pay | Admitting: *Deleted

## 2019-02-09 NOTE — Telephone Encounter (Signed)
Opened in error

## 2019-02-15 ENCOUNTER — Other Ambulatory Visit (HOSPITAL_COMMUNITY): Payer: Self-pay | Admitting: *Deleted

## 2019-02-15 MED ORDER — FUROSEMIDE 80 MG PO TABS: 80.0000 mg | ORAL_TABLET | Freq: Every day | ORAL | 6 refills | Status: DC

## 2019-02-24 DIAGNOSIS — N3020 Other chronic cystitis without hematuria: Secondary | ICD-10-CM | POA: Diagnosis not present

## 2019-02-24 DIAGNOSIS — R338 Other retention of urine: Secondary | ICD-10-CM | POA: Diagnosis not present

## 2019-02-26 ENCOUNTER — Other Ambulatory Visit (HOSPITAL_COMMUNITY): Payer: Self-pay | Admitting: Adult Health

## 2019-02-28 DIAGNOSIS — N189 Chronic kidney disease, unspecified: Secondary | ICD-10-CM | POA: Diagnosis not present

## 2019-02-28 DIAGNOSIS — N183 Chronic kidney disease, stage 3 (moderate): Secondary | ICD-10-CM | POA: Diagnosis not present

## 2019-03-01 ENCOUNTER — Other Ambulatory Visit: Payer: Self-pay

## 2019-03-01 MED ORDER — ROSUVASTATIN CALCIUM 10 MG PO TABS: 10.0000 mg | ORAL_TABLET | Freq: Every day | ORAL | 2 refills | Status: DC

## 2019-03-02 ENCOUNTER — Other Ambulatory Visit (HOSPITAL_COMMUNITY): Payer: Self-pay | Admitting: Adult Health

## 2019-03-02 ENCOUNTER — Ambulatory Visit: Payer: Medicare HMO | Admitting: Internal Medicine

## 2019-03-08 ENCOUNTER — Other Ambulatory Visit (HOSPITAL_COMMUNITY): Payer: Self-pay | Admitting: Cardiology

## 2019-03-08 MED ORDER — APIXABAN 5 MG PO TABS: 5.0000 mg | ORAL_TABLET | Freq: Two times a day (BID) | ORAL | 3 refills | Status: DC

## 2019-03-08 MED ORDER — RANOLAZINE ER 500 MG PO TB12: 500.0000 mg | ORAL_TABLET | Freq: Two times a day (BID) | ORAL | 3 refills | Status: DC

## 2019-03-08 MED ORDER — FUROSEMIDE 80 MG PO TABS: 80.0000 mg | ORAL_TABLET | Freq: Every day | ORAL | 3 refills | Status: DC

## 2019-03-08 MED ORDER — ISOSORBIDE MONONITRATE ER 30 MG PO TB24: ORAL_TABLET | ORAL | 1 refills | Status: DC

## 2019-03-08 MED ORDER — AMIODARONE HCL 200 MG PO TABS: 200.0000 mg | ORAL_TABLET | Freq: Every day | ORAL | 3 refills | Status: DC

## 2019-03-08 MED ORDER — POTASSIUM CHLORIDE CRYS ER 20 MEQ PO TBCR: 40.0000 meq | EXTENDED_RELEASE_TABLET | Freq: Every day | ORAL | 3 refills | Status: DC

## 2019-03-08 MED ORDER — HYDRALAZINE HCL 25 MG PO TABS: 25.0000 mg | ORAL_TABLET | Freq: Three times a day (TID) | ORAL | 3 refills | Status: DC

## 2019-03-29 DIAGNOSIS — R338 Other retention of urine: Secondary | ICD-10-CM | POA: Diagnosis not present

## 2019-04-04 DIAGNOSIS — N189 Chronic kidney disease, unspecified: Secondary | ICD-10-CM | POA: Diagnosis not present

## 2019-04-04 DIAGNOSIS — N183 Chronic kidney disease, stage 3 (moderate): Secondary | ICD-10-CM | POA: Diagnosis not present

## 2019-04-06 ENCOUNTER — Telehealth: Payer: Self-pay | Admitting: *Deleted

## 2019-04-06 DIAGNOSIS — Z006 Encounter for examination for normal comparison and control in clinical research program: Secondary | ICD-10-CM

## 2019-04-07 ENCOUNTER — Ambulatory Visit (HOSPITAL_COMMUNITY): Payer: Medicare HMO

## 2019-04-07 ENCOUNTER — Encounter (HOSPITAL_COMMUNITY): Payer: Medicare HMO | Admitting: Internal Medicine

## 2019-04-11 ENCOUNTER — Ambulatory Visit: Payer: Medicare HMO | Admitting: Internal Medicine

## 2019-04-15 ENCOUNTER — Encounter: Payer: Self-pay | Admitting: Internal Medicine

## 2019-04-15 ENCOUNTER — Other Ambulatory Visit: Payer: Self-pay | Admitting: Internal Medicine

## 2019-04-15 ENCOUNTER — Ambulatory Visit (INDEPENDENT_AMBULATORY_CARE_PROVIDER_SITE_OTHER): Payer: Medicare HMO | Admitting: Internal Medicine

## 2019-04-15 ENCOUNTER — Other Ambulatory Visit: Payer: Self-pay

## 2019-04-15 ENCOUNTER — Other Ambulatory Visit (INDEPENDENT_AMBULATORY_CARE_PROVIDER_SITE_OTHER): Payer: Medicare HMO

## 2019-04-15 VITALS — BP 112/70 | HR 88 | Temp 98.3°F | Ht 65.0 in | Wt 203.0 lb

## 2019-04-15 DIAGNOSIS — E559 Vitamin D deficiency, unspecified: Secondary | ICD-10-CM

## 2019-04-15 DIAGNOSIS — Z0000 Encounter for general adult medical examination without abnormal findings: Secondary | ICD-10-CM

## 2019-04-15 DIAGNOSIS — Z23 Encounter for immunization: Secondary | ICD-10-CM

## 2019-04-15 DIAGNOSIS — I480 Paroxysmal atrial fibrillation: Secondary | ICD-10-CM | POA: Diagnosis not present

## 2019-04-15 DIAGNOSIS — R8290 Unspecified abnormal findings in urine: Secondary | ICD-10-CM

## 2019-04-15 DIAGNOSIS — E538 Deficiency of other specified B group vitamins: Secondary | ICD-10-CM | POA: Diagnosis not present

## 2019-04-15 DIAGNOSIS — I10 Essential (primary) hypertension: Secondary | ICD-10-CM | POA: Diagnosis not present

## 2019-04-15 DIAGNOSIS — Z0001 Encounter for general adult medical examination with abnormal findings: Secondary | ICD-10-CM | POA: Diagnosis not present

## 2019-04-15 LAB — CBC WITH DIFFERENTIAL/PLATELET
Basophils Absolute: 0.1 10*3/uL (ref 0.0–0.1)
Basophils Relative: 1.1 % (ref 0.0–3.0)
Eosinophils Absolute: 0.5 10*3/uL (ref 0.0–0.7)
Eosinophils Relative: 6.1 % — ABNORMAL HIGH (ref 0.0–5.0)
HCT: 41.8 % (ref 36.0–46.0)
Hemoglobin: 13.9 g/dL (ref 12.0–15.0)
Lymphocytes Relative: 16.3 % (ref 12.0–46.0)
Lymphs Abs: 1.3 10*3/uL (ref 0.7–4.0)
MCHC: 33.3 g/dL (ref 30.0–36.0)
MCV: 93.1 fl (ref 78.0–100.0)
Monocytes Absolute: 0.6 10*3/uL (ref 0.1–1.0)
Monocytes Relative: 7.1 % (ref 3.0–12.0)
Neutro Abs: 5.5 10*3/uL (ref 1.4–7.7)
Neutrophils Relative %: 69.4 % (ref 43.0–77.0)
Platelets: 185 10*3/uL (ref 150.0–400.0)
RBC: 4.49 Mil/uL (ref 3.87–5.11)
RDW: 13.8 % (ref 11.5–15.5)
WBC: 7.9 10*3/uL (ref 4.0–10.5)

## 2019-04-15 LAB — BASIC METABOLIC PANEL
BUN: 32 mg/dL — ABNORMAL HIGH (ref 6–23)
CO2: 34 mEq/L — ABNORMAL HIGH (ref 19–32)
Calcium: 9.8 mg/dL (ref 8.4–10.5)
Chloride: 96 mEq/L (ref 96–112)
Creatinine, Ser: 2.09 mg/dL — ABNORMAL HIGH (ref 0.40–1.20)
GFR: 28.69 mL/min — ABNORMAL LOW (ref >60.00)
Glucose, Bld: 92 mg/dL (ref 70–99)
Potassium: 4.1 mEq/L (ref 3.5–5.1)
Sodium: 138 mEq/L (ref 135–145)

## 2019-04-15 LAB — LIPID PANEL
Cholesterol: 182 mg/dL (ref 0–200)
HDL: 74.3 mg/dL (ref >39.00)
LDL Cholesterol: 86 mg/dL (ref 0–99)
NonHDL: 107.77
Total CHOL/HDL Ratio: 2
Triglycerides: 107 mg/dL (ref 0.0–149.0)
VLDL: 21.4 mg/dL (ref 0.0–40.0)

## 2019-04-15 LAB — URINALYSIS, ROUTINE W REFLEX MICROSCOPIC
Bilirubin Urine: NEGATIVE
Ketones, ur: NEGATIVE
Nitrite: POSITIVE — AB
Specific Gravity, Urine: 1.01 (ref 1.000–1.030)
Total Protein, Urine: NEGATIVE
Urine Glucose: NEGATIVE
Urobilinogen, UA: 0.2 — AB (ref 0.0–1.0)
pH: 6 (ref 5.0–8.0)

## 2019-04-15 LAB — HEPATIC FUNCTION PANEL
ALT: 7 U/L (ref 0–35)
AST: 12 U/L (ref 0–37)
Albumin: 4 g/dL (ref 3.5–5.2)
Alkaline Phosphatase: 65 U/L (ref 39–117)
Bilirubin, Direct: 0.2 mg/dL (ref 0.0–0.3)
Total Bilirubin: 0.7 mg/dL (ref 0.2–1.2)
Total Protein: 7.6 g/dL (ref 6.0–8.3)

## 2019-04-15 LAB — VITAMIN D 25 HYDROXY (VIT D DEFICIENCY, FRACTURES): VITD: 12.04 ng/mL — ABNORMAL LOW (ref 30.00–100.00)

## 2019-04-15 LAB — VITAMIN B12: Vitamin B-12: 317 pg/mL (ref 211–911)

## 2019-04-15 LAB — TSH: TSH: 0.54 u[IU]/mL (ref 0.35–4.50)

## 2019-04-15 MED ORDER — VITAMIN D (ERGOCALCIFEROL) 1.25 MG (50000 UNIT) PO CAPS: 50000.0000 [IU] | ORAL_CAPSULE | ORAL | 0 refills | Status: DC

## 2019-04-15 MED ORDER — CEPHALEXIN 500 MG PO CAPS: 500.0000 mg | ORAL_CAPSULE | Freq: Three times a day (TID) | ORAL | 0 refills | Status: AC

## 2019-04-15 NOTE — Assessment & Plan Note (Signed)
stable overall by history and exam, recent data reviewed with pt, and pt to continue medical treatment as before,  to f/u any worsening symptoms or concerns  

## 2019-04-15 NOTE — Assessment & Plan Note (Addendum)
With possible UTI - for UA with labs, consider antibx  In addition to the time spent performing CPE, I spent an additional 25 minutes face to face,in which greater than 50% of this time was spent in counseling and coordination of care for patient's acute illness as documented, including the differential dx, treatment, further evaluation and other management of urinary abnormal, PAF, HTn, VIt D deficiency

## 2019-04-15 NOTE — Assessment & Plan Note (Signed)
stable overall by history and exam, stable rate and volume status, recent data reviewed with pt, and pt to continue medical treatment as before,  to f/u any worsening symptoms or concerns

## 2019-04-15 NOTE — Patient Instructions (Addendum)
You had the Tdap tetanus and Prevnar 13 pneumonia shots  Please continue all other medications as before, and refills have been done if requested.  Please have the pharmacy call with any other refills you may need.  Please continue your efforts at being more active, low cholesterol diet, and weight control.  You are otherwise up to date with prevention measures today.  Please keep your appointments with your specialists as you may have planned  Please go to the LAB in the Basement (turn left off the elevator) for the tests to be done today  You will be contacted by phone if any changes need to be made immediately.  Otherwise, you will receive a letter about your results with an explanation, but please check with MyChart first.  Please remember to sign up for MyChart if you have not done so, as this will be important to you in the future with finding out test results, communicating by private email, and scheduling acute appointments online when needed.  Please return in 6 months, or sooner if needed

## 2019-04-15 NOTE — Assessment & Plan Note (Signed)

## 2019-04-15 NOTE — Assessment & Plan Note (Signed)
For f/u lab today, may need replacement

## 2019-04-15 NOTE — Progress Notes (Signed)
Subjective:    Patient ID: Patricia Wagner, female    DOB: [birth year], 68 y.o.   MRN: [id number]  HPI  Here for wellness and f/u;  Overall doing ok;  Pt denies Chest pain, worsening SOB, DOE, wheezing, orthopnea, PND, worsening LE edema, palpitations, dizziness or syncope.  Pt denies neurological change such as new headache, facial or extremity weakness.  Pt denies polydipsia, polyuria, or low sugar symptoms. Pt states overall good compliance with treatment and medications, good tolerability, and has been trying to follow appropriate diet.  Pt denies worsening depressive symptoms, suicidal ideation or panic. No fever, night sweats, wt loss, loss of appetite, or other constitutional symptoms.  Pt states good ability with ADL's, has low fall risk, home safety reviewed and adequate, no other significant changes in hearing or vision, and only occasionally active with exercise. No other new complaints except: C/O mild burning urinary symptoms x 3 days with dysuria, frequency, urgency, fbut no lank pain, hematuria or n/v, fever, chills. Wt Readings from Last 3 Encounters:  04/15/19 203 lb (92.1 kg)  01/07/19 204 lb (92.5 kg)  10/26/18 194 lb 9.6 oz (88.3 kg)   Past Medical History:  Diagnosis Date  . Acute blood loss anemia 03/05/2018  . Acute hypercapnic respiratory failure ([place]) 04/30/2017  . Acute renal failure superimposed on chronic kidney disease ([place]) 10/20/2016  . Arthritis   . Atrial fibrillation ([place])    a. s/p multiple cardioversions; failed tikosyn/sotalol.  . Bradycardia 11/20/2016  . CAD in native artery, s/p cardiac cath with non obstructive CAD 10/24/2016  . Cerebrovascular accident (CVA) due to embolism of left cerebellar artery ([place])   . CHF (congestive heart failure) ([place])   . Chronic anticoagulation 03/15/2018  . Chronic atrial fibrillation 03/05/2018  . Chronic systolic (congestive) heart failure ([place]) 03/05/2018  . CKD (chronic kidney disease) stage 3, GFR 30-59 ml/min (HCC)   .  Coagulopathy ([place]) 03/05/2018  . DIVERTICULITIS, HX OF 07/25/2007  . Diverticulosis of colon with hemorrhage 07/22/2007   Qualifier: Diagnosis of  By: [name]    . DIVERTICULOSIS, COLON 07/22/2007  . Dyspnea   . Dysuria 07/21/2017  . Edema, peripheral    a. chronic BLE edema, R>L. Prior trauma from dog attack and accident.  . Essential hypertension 07/22/2007   Qualifier: Diagnosis of  By: [name]    . HLD (hyperlipidemia) 02/03/2008   Qualifier: Diagnosis of  By: [name], [name]   . HYPERLIPIDEMIA 02/03/2008  . Hypersomnia    declines w/u  . Hypertension   . Hypotension (arterial) 04/30/2017  . MENOPAUSAL DISORDER 01/09/2011  . Morbid obesity ([place]) 07/22/2007  . NICM (nonischemic cardiomyopathy) ([place]) 10/24/2016  . PAF (paroxysmal atrial fibrillation) ([place])   . Raynaud's syndrome 07/22/2007  . Stroke ([place]) 2017  . Suspected sleep apnea 05/20/2017  . THYROID NODULE, RIGHT 01/04/2010  . VITAMIN D DEFICIENCY 01/09/2011   Qualifier: Diagnosis of  By: [name], [name]    Past Surgical History:  Procedure Laterality Date  . CARDIAC CATHETERIZATION N/A 10/23/2016   Procedure: Left Heart Cath and Coronary Angiography;  Surgeon: [name], MD;  Location: [place] [facility];  Service: Cardiovascular;  Laterality: N/A;  . CARDIOVERSION N/A 10/31/2016   Procedure: CARDIOVERSION;  Surgeon: [name], MD;  Location: [facility] ENDOSCOPY;  Service: Cardiovascular;  Laterality: N/A;  . CARDIOVERSION N/A 11/03/2016   Procedure: CARDIOVERSION;  Surgeon: [name], MD;  Location: [place];  Service: Cardiovascular;  Laterality: N/A;  .  CARDIOVERSION N/A 11/18/2016   Procedure: CARDIOVERSION;  Surgeon: [name], MD;  Location: [place];  Service: Cardiovascular;  Laterality: N/A;  . CARDIOVERSION N/A 03/25/2017   Procedure: CARDIOVERSION;  Surgeon: [name], MD;  Location: [facility] ENDOSCOPY;  Service: Cardiovascular;  Laterality: N/A;  . CARDIOVERSION N/A 05/06/2017    Procedure: CARDIOVERSION;  Surgeon: [name], MD;  Location: [facility] ENDOSCOPY;  Service: Cardiovascular;  Laterality: N/A;  . CARDIOVERSION N/A 05/12/2017   Procedure: CARDIOVERSION;  Surgeon: [name], MD;  Location: [facility] ENDOSCOPY;  Service: Cardiovascular;  Laterality: N/A;  . COLONOSCOPY N/A 12/04/2017   Procedure: COLONOSCOPY;  Surgeon: [name], MD;  Location: [facility] ENDOSCOPY;  Service: Endoscopy;  Laterality: N/A;  . COLONOSCOPY W/ POLYPECTOMY  02/2011   pan diverticulosis.  tubular adenoma without dysplasia on 5 mm sigmoid polyp.  Dr Fuller Plan.    Marland Kitchen PARTIAL HYSTERECTOMY     1 OVARY LEFT  . TEE WITHOUT CARDIOVERSION N/A 10/31/2016   Procedure: TRANSESOPHAGEAL ECHOCARDIOGRAM (TEE);  Surgeon: [name], MD;  Location: [facility] ENDOSCOPY;  Service: Cardiovascular;  Laterality: N/A;  . TEE WITHOUT CARDIOVERSION N/A 03/25/2017   Procedure: TRANSESOPHAGEAL ECHOCARDIOGRAM (TEE);  Surgeon: [name], MD;  Location: [facility] ENDOSCOPY;  Service: Cardiovascular;  Laterality: N/A;    reports that she has quit smoking. Her smoking use included cigarettes. She started smoking about 16 months ago. She smoked 0.50 packs per day. She has never used smokeless tobacco. She reports that she does not drink alcohol or use drugs. family history includes Asthma in her mother; Diabetes in her father; Heart disease in her father and sister; Lung disease in her sister. Allergies  Allergen Reactions  . Ace Inhibitors Palpitations   Current Outpatient Medications on File Prior to Visit  Medication Sig Dispense Refill  . amiodarone (PACERONE) 200 MG tablet Take 1 tablet (200 mg total) by mouth daily. 90 tablet 3  . apixaban (ELIQUIS) 5 MG TABS tablet Take 1 tablet (5 mg total) by mouth 2 (two) times daily. 180 tablet 3  . epoetin alfa (EPOGEN,PROCRIT) 66063 UNIT/ML injection Inject 5,000 Units into the skin once a week.    . furosemide (LASIX) 80 MG tablet Take 1 tablet (80 mg total) by mouth daily. 90  tablet 3  . isosorbide mononitrate (IMDUR) 30 MG 24 hr tablet TAKE HALF A TABLET BY MOUTH EVERY DAY 45 tablet 1  . potassium chloride SA (K-DUR,KLOR-CON) 20 MEQ tablet Take 2 tablets (40 mEq total) by mouth daily. 180 tablet 3  . ranolazine (RANEXA) 500 MG 12 hr tablet Take 1 tablet (500 mg total) by mouth 2 (two) times daily. 180 tablet 3  . rosuvastatin (CRESTOR) 10 MG tablet Take 1 tablet (10 mg total) by mouth daily. 30 tablet 2  . hydrALAZINE (APRESOLINE) 25 MG tablet Take 1 tablet (25 mg total) by mouth 3 (three) times daily for 30 days. 270 tablet 3   No current facility-administered medications on file prior to visit.    Review of Systems Constitutional: Negative for other unusual diaphoresis, sweats, appetite or weight changes HENT: Negative for other worsening hearing loss, ear pain, facial swelling, mouth sores or neck stiffness.   Eyes: Negative for other worsening pain, redness or other visual disturbance.  Respiratory: Negative for other stridor or swelling Cardiovascular: Negative for other palpitations or other chest pain  Gastrointestinal: Negative for worsening diarrhea or loose stools, blood in stool, distention or other pain Genitourinary: Negative for hematuria, flank pain or other change  in urine volume.  Musculoskeletal: Negative for myalgias or other joint swelling.  Skin: Negative for other color change, or other wound or worsening drainage.  Neurological: Negative for other syncope or numbness. Hematological: Negative for other adenopathy or swelling Psychiatric/Behavioral: Negative for hallucinations, other worsening agitation, SI, self-injury, or new decreased concentration All other system neg per pt    Objective:   Physical Exam BP 112/70   Pulse 88   Temp 98.3 F (36.8 C) (Oral)   Ht 5\' 5"  (1.651 m)   Wt 203 lb (92.1 kg)   SpO2 92%   BMI 33.78 kg/m  VS noted,  Constitutional: Pt is oriented to person, place, and time. Appears well-developed and  well-nourished, in no significant distress and comfortable Head: Normocephalic and atraumatic  Eyes: Conjunctivae and EOM are normal. Pupils are equal, round, and reactive to light Right Ear: External ear normal without discharge Left Ear: External ear normal without discharge Nose: Nose without discharge or deformity Mouth/Throat: Oropharynx is without other ulcerations and moist  Neck: Normal range of motion. Neck supple. No JVD present. No tracheal deviation present or significant neck LA or mass Cardiovascular: Normal rate, regular rhythm, normal heart sounds and intact distal pulses.   Pulmonary/Chest: WOB normal and breath sounds without rales or wheezing  Abdominal: Soft. Bowel sounds are normal. NT. No HSM  Musculoskeletal: Normal range of motion. Exhibits no edema Lymphadenopathy: Has no other cervical adenopathy.  Neurological: Pt is alert and oriented to person, place, and time. Pt has normal reflexes. No cranial nerve deficit. Motor grossly intact, Gait intact Skin: Skin is warm and dry. No rash noted or new ulcerations Psychiatric:  Has normal mood and affect. Behavior is normal without agitation No other exam findings Lab Results  Component Value Date   WBC 7.9 04/15/2019   HGB 13.9 04/15/2019   HCT 41.8 04/15/2019   PLT 185.0 04/15/2019   GLUCOSE 92 04/15/2019   CHOL 182 04/15/2019   TRIG 107.0 04/15/2019   HDL 74.30 04/15/2019   LDLDIRECT 121.6 03/15/2012   LDLCALC 86 04/15/2019   ALT 7 04/15/2019   AST 12 04/15/2019   NA 138 04/15/2019   K 4.1 04/15/2019   CL 96 04/15/2019   CREATININE 2.09 (H) 04/15/2019   BUN 32 (H) 04/15/2019   CO2 34 (H) 04/15/2019   TSH 0.54 04/15/2019   INR 2.46 08/05/2018   HGBA1C 6.4 (H) 08/06/2018      Assessment & Plan:

## 2019-04-19 ENCOUNTER — Telehealth: Payer: Self-pay

## 2019-04-19 NOTE — Telephone Encounter (Signed)
Pt has been informed of results and expressed understanding.  °

## 2019-04-19 NOTE — Telephone Encounter (Signed)
-----   Message from Biagio Borg, MD sent at 04/15/2019  7:49 PM EDT ----- Letter sent, cont same tx except  The test results show that your current treatment is OK, except the Urine sample is consistent with probable infection, and the Vitamin D level is low.  I will send an antibiotic, as well as Vitamin D 50000 units every week for 12 weeks.  After that, please take OTC Vitamin D3 at 2000 units per day.Patricia Wagner to please inform pt, I will do rx x 2

## 2019-04-28 DIAGNOSIS — R338 Other retention of urine: Secondary | ICD-10-CM | POA: Diagnosis not present

## 2019-05-05 ENCOUNTER — Telehealth: Payer: Self-pay | Admitting: *Deleted

## 2019-05-05 NOTE — Telephone Encounter (Signed)
[facility] APPOINTMENT WILL BE IN OFFICE APPOINTMENT ON 05/10/2019

## 2019-05-09 NOTE — Telephone Encounter (Signed)
   Primary Cardiologist: Glori Bickers, MD   Pt contacted.  History and symptoms reviewed.  Pt will f/u with [facility] provider as scheduled.  Pt. advised that we are restricting visitors at this time and request that only patients present for check-in prior to their appointment.  All other visitors should remain in their car.  If necessary, only one visitor may come with the patient, into the building. For everyone's safety, all patients and visitor entering our practice area should expect to be screened again prior to entering our waiting area.  Shonia Skilling [facility]  05/09/2019 9:58 AM     COVID-19 Pre-Screening Questions:  . In the past 7 to 10 days have you had a cough,  shortness of breath, headache, congestion, fever (100 or greater) body aches, chills, sore throat, or sudden loss of taste or sense of smell?  no . Have you been around anyone with known Covid 19?  no . Have you been around anyone who is awaiting Covid 19 test results in the past 7 to 10 days?  no . Have you been around anyone who has been exposed to Covid 19, or has mentioned symptoms of Covid 19 within the past 7 to 10 days? no  If you have any concerns/questions about symptoms patients report during screening (either on the phone or at threshold). Contact the provider seeing the patient or DOD for further guidance.  If neither are available contact a member of the leadership team.

## 2019-05-10 ENCOUNTER — Other Ambulatory Visit: Payer: Self-pay

## 2019-05-10 ENCOUNTER — Encounter: Payer: Self-pay | Admitting: Cardiology

## 2019-05-10 ENCOUNTER — Ambulatory Visit: Payer: Medicare HMO | Admitting: Cardiology

## 2019-05-10 VITALS — BP 162/94 | HR 85 | Ht 65.0 in | Wt 210.0 lb

## 2019-05-10 DIAGNOSIS — I4819 Other persistent atrial fibrillation: Secondary | ICD-10-CM | POA: Diagnosis not present

## 2019-05-10 NOTE — Progress Notes (Signed)
Electrophysiology Office Note   Date:  05/10/2019   ID:  Patricia Wagner, Patricia Wagner Jan 02, 1951, MRN [id number]  PCP:  Biagio Borg, MD  Cardiologist:  [place] Primary Electrophysiologist:  [name], MD  CC: Persistent Afib.   History of Present Illness: Patricia Wagner is a 68 y.o. female who is being seen today for the evaluation of CHF, atrial fibrillation at the request of Biagio Borg, MD. Presenting today for electrophysiology evaluation. She has a history of persistent atrial fibrillation, nonischemic cardiomyopathy, CHF, hypertension, and hyperlipidemia. She has had atrial fibrillation since December 2017. She is had multiple cardioversions and has been loaded on amiodarone. She was admitted to the hospital in June 2018 in cardiogenic shock in the setting of persistent atrial fibrillation. EF was found to be 15% with severe bi-V dysfunction. She was started on amiodarone. She was successfully cardioverted to sinus rhythm after initiation of IV amiodarone.Since that time she has done well without recurrence of atrial fibrillation.  She presents today for 6 month follow up. Doing well overall. Followed in HF clinic. She denies symptoms of palpitations, chest pain, shortness of breath, orthopnea, PND, lower extremity edema, claudication, dizziness, presyncope, syncope, or neurologic sequela. The patient is tolerating medications without difficulties.  Her BP is slightly high on arrival, but she has not taken her medications yet this am. She has not missed any Eliquis. Denies bleeding.   Past Medical History:  Diagnosis Date   Acute blood loss anemia 03/05/2018   Acute hypercapnic respiratory failure ([place]) 04/30/2017   Acute renal failure superimposed on chronic kidney disease ([place]) 10/20/2016   Arthritis    Atrial fibrillation ([place])    a. s/p multiple cardioversions; failed tikosyn/sotalol.   Bradycardia 11/20/2016   CAD in native artery, s/p cardiac cath with non obstructive  CAD 10/24/2016   Cerebrovascular accident (CVA) due to embolism of left cerebellar artery (HCC)    CHF (congestive heart failure) ([place])    Chronic anticoagulation 03/15/2018   Chronic atrial fibrillation 02/24/4741   Chronic systolic (congestive) heart failure ([place]) 03/05/2018   CKD (chronic kidney disease) stage 3, GFR 30-59 ml/min (HCC)    Coagulopathy ([place]) 03/05/2018   DIVERTICULITIS, HX OF 07/25/2007   Diverticulosis of colon with hemorrhage 07/22/2007   Qualifier: Diagnosis of  By: [name]     DIVERTICULOSIS, COLON 07/22/2007   Dyspnea    Dysuria 07/21/2017   Edema, peripheral    a. chronic BLE edema, R>L. Prior trauma from dog attack and accident.   Essential hypertension 07/22/2007   Qualifier: Diagnosis of  By: [name]     HLD (hyperlipidemia) 02/03/2008   Qualifier: Diagnosis of  By: [name], [name]    HYPERLIPIDEMIA 02/03/2008   Hypersomnia    declines w/u   Hypertension    Hypotension (arterial) 04/30/2017   MENOPAUSAL DISORDER 01/09/2011   Morbid obesity ([place]) 07/22/2007   NICM (nonischemic cardiomyopathy) ([place]) 10/24/2016   PAF (paroxysmal atrial fibrillation) ([place])    Raynaud's syndrome 07/22/2007   Stroke ([place]) 2017   Suspected sleep apnea 05/20/2017   THYROID NODULE, RIGHT 01/04/2010   VITAMIN D DEFICIENCY 01/09/2011   Qualifier: Diagnosis of  By: [name], [name]    Past Surgical History:  Procedure Laterality Date   CARDIAC CATHETERIZATION N/A 10/23/2016   Procedure: Left Heart Cath and Coronary Angiography;  Surgeon: [name], MD;  Location: [place] [facility];  Service: Cardiovascular;  Laterality: N/A;   CARDIOVERSION N/A 10/31/2016   Procedure:  CARDIOVERSION;  Surgeon: [name], MD;  Location: [place];  Service: Cardiovascular;  Laterality: N/A;   CARDIOVERSION N/A 11/03/2016   Procedure: CARDIOVERSION;  Surgeon: [name], MD;  Location: [place];  Service: Cardiovascular;  Laterality: N/A;    CARDIOVERSION N/A 11/18/2016   Procedure: CARDIOVERSION;  Surgeon: [name], MD;  Location: [place];  Service: Cardiovascular;  Laterality: N/A;   CARDIOVERSION N/A 03/25/2017   Procedure: CARDIOVERSION;  Surgeon: [name], MD;  Location: [facility] ENDOSCOPY;  Service: Cardiovascular;  Laterality: N/A;   CARDIOVERSION N/A 05/06/2017   Procedure: CARDIOVERSION;  Surgeon: [name], MD;  Location: [facility] ENDOSCOPY;  Service: Cardiovascular;  Laterality: N/A;   CARDIOVERSION N/A 05/12/2017   Procedure: CARDIOVERSION;  Surgeon: [name], MD;  Location: [facility] ENDOSCOPY;  Service: Cardiovascular;  Laterality: N/A;   COLONOSCOPY N/A 12/04/2017   Procedure: COLONOSCOPY;  Surgeon: [name], MD;  Location: [place];  Service: Endoscopy;  Laterality: N/A;   COLONOSCOPY W/ POLYPECTOMY  02/2011   pan diverticulosis.  tubular adenoma without dysplasia on 5 mm sigmoid polyp.  Dr Fuller Plan.     PARTIAL HYSTERECTOMY     1 OVARY LEFT   TEE WITHOUT CARDIOVERSION N/A 10/31/2016   Procedure: TRANSESOPHAGEAL ECHOCARDIOGRAM (TEE);  Surgeon: [name], MD;  Location: [facility] ENDOSCOPY;  Service: Cardiovascular;  Laterality: N/A;   TEE WITHOUT CARDIOVERSION N/A 03/25/2017   Procedure: TRANSESOPHAGEAL ECHOCARDIOGRAM (TEE);  Surgeon: [name], MD;  Location: [facility] ENDOSCOPY;  Service: Cardiovascular;  Laterality: N/A;     Current Outpatient Medications  Medication Sig Dispense Refill   amiodarone (PACERONE) 200 MG tablet Take 1 tablet (200 mg total) by mouth daily. 90 tablet 3   apixaban (ELIQUIS) 5 MG TABS tablet Take 1 tablet (5 mg total) by mouth 2 (two) times daily. 180 tablet 3   epoetin alfa (EPOGEN,PROCRIT) 48016 UNIT/ML injection Inject 5,000 Units into the skin once a week.     furosemide (LASIX) 80 MG tablet Take 1 tablet (80 mg total) by mouth daily. 90 tablet 3   isosorbide mononitrate (IMDUR) 30 MG 24 hr tablet TAKE HALF A TABLET BY MOUTH EVERY DAY 45 tablet 1    potassium chloride SA (K-DUR,KLOR-CON) 20 MEQ tablet Take 2 tablets (40 mEq total) by mouth daily. 180 tablet 3   ranolazine (RANEXA) 500 MG 12 hr tablet Take 1 tablet (500 mg total) by mouth 2 (two) times daily. 180 tablet 3   rosuvastatin (CRESTOR) 10 MG tablet Take 1 tablet (10 mg total) by mouth daily. 30 tablet 2   Vitamin D, Ergocalciferol, (DRISDOL) 1.25 MG (50000 UT) CAPS capsule Take 1 capsule (50,000 Units total) by mouth every 7 (seven) days. 12 capsule 0   hydrALAZINE (APRESOLINE) 25 MG tablet Take 1 tablet (25 mg total) by mouth 3 (three) times daily for 30 days. 270 tablet 3   No current facility-administered medications for this visit.     Allergies:   Ace inhibitors   Social History:  The patient  reports that she has quit smoking. Her smoking use included cigarettes. She started smoking about 17 months ago. She smoked 0.50 packs per day. She has never used smokeless tobacco. She reports that she does not drink alcohol or use drugs.   Family History:  The patient's family history includes Asthma in her mother; Diabetes in her father; Heart disease in her father and sister; Lung disease in her sister.   Review of systems complete and found to be  negative unless listed in HPI.    PHYSICAL EXAM: Vitals:   05/10/19 0858  BP: (!) 162/94  Pulse: 85  Weight: 210 lb (95.3 kg)  Height: 5\' 5"  (1.651 m)    GEN- The patient is well appearing, alert and oriented x 3 today.   Head- normocephalic, atraumatic Eyes-  Sclera clear, conjunctiva pink Ears- hearing intact Oropharynx- clear Neck- supple,  Lungs- Clear to ausculation bilaterally, normal work of breathing Heart- Regular rate and rhythm, no murmurs, rubs or gallops, PMI not laterally displaced GI- soft, NT, ND, + BS Extremities- no clubbing, cyanosis, or edema Neuro- strength and sensation are intact    EKG:  EKG is ordered today. Personal review of the ekg ordered shows SR, LAE, LVH   Recent Labs: 08/05/2018: B  Natriuretic Peptide 3,951.4 08/16/2018: Magnesium 2.2 04/15/2019: ALT 7; BUN 32; Creatinine, Ser 2.09; Hemoglobin 13.9; Platelets 185.0; Potassium 4.1; Sodium 138; TSH 0.54    Lipid Panel     Component Value Date/Time   CHOL 182 04/15/2019 1549   TRIG 107.0 04/15/2019 1549   HDL 74.30 04/15/2019 1549   CHOLHDL 2 04/15/2019 1549   VLDL 21.4 04/15/2019 1549   LDLCALC 86 04/15/2019 1549   LDLDIRECT 121.6 03/15/2012 1646     Wt Readings from Last 3 Encounters:  05/10/19 210 lb (95.3 kg)  04/15/19 203 lb (92.1 kg)  01/07/19 204 lb (92.5 kg)      Other studies Reviewed: Additional studies/ records that were reviewed today include: TTE 03/06/18  Review of the above records today demonstrates:  - Left ventricle: The cavity size was normal. There was severe   concentric hypertrophy. Systolic function was mildly to   moderately reduced. The estimated ejection fraction was in the   range of 40% to 45%. Mild diffuse hypokinesis with no   identifiable regional variations. Doppler parameters are   consistent with abnormal left ventricular relaxation (grade 1   diastolic dysfunction). - Aortic valve: There was mild stenosis. Valve area (VTI): 1.5   cm^2. Valve area (Vmax): 1.59 cm^2. Valve area (Vmean): 1.4 cm^2. - Mitral valve: Calcified annulus. - Left atrium: The atrium was severely dilated. - Right atrium: The atrium was moderately dilated. - Pericardium, extracardiac: There was a left pleural effusion.  LHC 10/23/16 1. Mild, non-obstructive coronary artery disease consistent with non-ischemic cardiomyopathy. 2. Mildly elevated left ventricular filling pressure.   ASSESSMENT AND PLAN:  1.  Persistent atrial fibrillation: Stable on Eliquis and amiodarone.  She is tolerating the amiodarone without issue.  Recent LFTs/TSH stable. It is felt that if she needs ablation, she may need both antiarrhythmics and ablation to keep her in sinus rhythm, but hope that we may be able to get her  off amiodarone. Pt has now agreed to Afib ablation. Will schedule as able within the next few months.      This patients CHA2DS2-VASc Score and unadjusted Ischemic Stroke Rate (% per year) is equal to 3.2 % stroke rate/year from a score of 3  Above score calculated as 1 point each if present [CHF, HTN, DM, Vascular=MI/PAD/Aortic Plaque, Age if 65-74, or Female] Above score calculated as 2 points each if present [Age > 75, or Stroke/TIA/TE]   2. Nonischemic cardiomyopathy: EF 40-45% 02/2018. Repeat pending per HF team. No changes.   Current medicines are reviewed at length with the patient today.   The patient does not have concerns regarding her medicines.  The following changes were made today: None  Labs/ tests ordered today include:  No orders of the defined types were placed in this encounter.   Disposition:  FU with Dr Curt Bears 3 months after ablation.   Jacalyn Lefevre, PA-C  05/10/2019 9:18 AM     [facility] [address] [place] [place] [address] [phone] (office) [phone] (fax)   I have seen and examined this patient with Oda Kilts.  Agree with above, note added to reflect my findings.  On exam, RRR, no murmurs, lungs clear.  Currently she remains in sinus rhythm.  She is currently on amiodarone.  At this point, she has agreed to ablation.  She feels that she would like to be off of her amiodarone.  Risks and benefits were discussed and include bleeding, tamponade, heart block, stroke, damage to surrounding organs.  She understands these risks and is agreed to the procedure.  We will discuss this case with her primary cardiologist.  Will M. Camnitz MD 05/10/2019 9:24 AM

## 2019-05-26 DIAGNOSIS — R338 Other retention of urine: Secondary | ICD-10-CM | POA: Diagnosis not present

## 2019-06-02 ENCOUNTER — Telehealth (HOSPITAL_COMMUNITY): Payer: Self-pay | Admitting: Vascular Surgery

## 2019-06-02 NOTE — Telephone Encounter (Signed)
Left pt message that 7/30 appt w/ db has been moved 8/25 echo /fu 2 echo/ 3:00 f/u

## 2019-06-07 ENCOUNTER — Encounter: Payer: Self-pay | Admitting: *Deleted

## 2019-06-07 DIAGNOSIS — Z006 Encounter for examination for normal comparison and control in clinical research program: Secondary | ICD-10-CM

## 2019-06-07 NOTE — Research (Signed)
spoke with patient via phone for [facility] HF EOS visit. NO SAE/AE/PEP to report. I thanked her for her participation in the study.

## 2019-06-23 ENCOUNTER — Encounter (HOSPITAL_COMMUNITY): Payer: Medicare HMO | Admitting: Internal Medicine

## 2019-06-23 ENCOUNTER — Other Ambulatory Visit (HOSPITAL_COMMUNITY): Payer: Medicare HMO

## 2019-06-30 DIAGNOSIS — R338 Other retention of urine: Secondary | ICD-10-CM | POA: Diagnosis not present

## 2019-07-06 ENCOUNTER — Other Ambulatory Visit: Payer: Self-pay | Admitting: Internal Medicine

## 2019-07-19 ENCOUNTER — Ambulatory Visit (HOSPITAL_BASED_OUTPATIENT_CLINIC_OR_DEPARTMENT_OTHER)
Admission: RE | Admit: 2019-07-19 | Discharge: 2019-07-19 | Disposition: A | Discharge status: Home / Self Care | Payer: Medicare HMO | Source: Ambulatory Visit | Attending: Internal Medicine | Admitting: Internal Medicine

## 2019-07-19 ENCOUNTER — Ambulatory Visit (HOSPITAL_COMMUNITY)
Admission: RE | Admit: 2019-07-19 | Discharge: 2019-07-19 | Disposition: A | Discharge status: Home / Self Care | Payer: Medicare HMO | Source: Ambulatory Visit | Attending: Internal Medicine | Admitting: Internal Medicine

## 2019-07-19 ENCOUNTER — Encounter (HOSPITAL_COMMUNITY): Payer: Self-pay | Admitting: Internal Medicine

## 2019-07-19 ENCOUNTER — Other Ambulatory Visit: Payer: Self-pay

## 2019-07-19 VITALS — BP 140/94 | HR 64 | Wt 216.4 lb

## 2019-07-19 DIAGNOSIS — Z6836 Body mass index (BMI) 36.0-36.9, adult: Secondary | ICD-10-CM | POA: Diagnosis not present

## 2019-07-19 DIAGNOSIS — Z8249 Family history of ischemic heart disease and other diseases of the circulatory system: Secondary | ICD-10-CM | POA: Insufficient documentation

## 2019-07-19 DIAGNOSIS — I10 Essential (primary) hypertension: Secondary | ICD-10-CM | POA: Diagnosis not present

## 2019-07-19 DIAGNOSIS — R338 Other retention of urine: Secondary | ICD-10-CM | POA: Insufficient documentation

## 2019-07-19 DIAGNOSIS — I428 Other cardiomyopathies: Secondary | ICD-10-CM | POA: Insufficient documentation

## 2019-07-19 DIAGNOSIS — Z8673 Personal history of transient ischemic attack (TIA), and cerebral infarction without residual deficits: Secondary | ICD-10-CM | POA: Diagnosis not present

## 2019-07-19 DIAGNOSIS — Z87891 Personal history of nicotine dependence: Secondary | ICD-10-CM | POA: Insufficient documentation

## 2019-07-19 DIAGNOSIS — I130 Hypertensive heart and chronic kidney disease with heart failure and stage 1 through stage 4 chronic kidney disease, or unspecified chronic kidney disease: Secondary | ICD-10-CM | POA: Insufficient documentation

## 2019-07-19 DIAGNOSIS — E785 Hyperlipidemia, unspecified: Secondary | ICD-10-CM | POA: Insufficient documentation

## 2019-07-19 DIAGNOSIS — E559 Vitamin D deficiency, unspecified: Secondary | ICD-10-CM | POA: Diagnosis not present

## 2019-07-19 DIAGNOSIS — I480 Paroxysmal atrial fibrillation: Secondary | ICD-10-CM | POA: Insufficient documentation

## 2019-07-19 DIAGNOSIS — I5082 Biventricular heart failure: Secondary | ICD-10-CM | POA: Diagnosis not present

## 2019-07-19 DIAGNOSIS — N184 Chronic kidney disease, stage 4 (severe): Secondary | ICD-10-CM | POA: Diagnosis not present

## 2019-07-19 DIAGNOSIS — Z888 Allergy status to other drugs, medicaments and biological substances status: Secondary | ICD-10-CM | POA: Diagnosis not present

## 2019-07-19 DIAGNOSIS — Z7901 Long term (current) use of anticoagulants: Secondary | ICD-10-CM | POA: Insufficient documentation

## 2019-07-19 DIAGNOSIS — I5022 Chronic systolic (congestive) heart failure: Secondary | ICD-10-CM

## 2019-07-19 DIAGNOSIS — I440 Atrioventricular block, first degree: Secondary | ICD-10-CM | POA: Insufficient documentation

## 2019-07-19 DIAGNOSIS — I2510 Atherosclerotic heart disease of native coronary artery without angina pectoris: Secondary | ICD-10-CM | POA: Diagnosis not present

## 2019-07-19 DIAGNOSIS — R29818 Other symptoms and signs involving the nervous system: Secondary | ICD-10-CM

## 2019-07-19 DIAGNOSIS — J449 Chronic obstructive pulmonary disease, unspecified: Secondary | ICD-10-CM | POA: Diagnosis not present

## 2019-07-19 DIAGNOSIS — Z833 Family history of diabetes mellitus: Secondary | ICD-10-CM | POA: Insufficient documentation

## 2019-07-19 DIAGNOSIS — M1990 Unspecified osteoarthritis, unspecified site: Secondary | ICD-10-CM | POA: Insufficient documentation

## 2019-07-19 DIAGNOSIS — Z825 Family history of asthma and other chronic lower respiratory diseases: Secondary | ICD-10-CM | POA: Insufficient documentation

## 2019-07-19 DIAGNOSIS — Z79899 Other long term (current) drug therapy: Secondary | ICD-10-CM | POA: Insufficient documentation

## 2019-07-19 MED ORDER — HYDRALAZINE HCL 25 MG PO TABS: 12.5000 mg | ORAL_TABLET | Freq: Three times a day (TID) | ORAL | 3 refills | Status: DC

## 2019-07-19 MED ORDER — FUROSEMIDE 80 MG PO TABS: 80.0000 mg | ORAL_TABLET | Freq: Two times a day (BID) | ORAL | 3 refills | Status: DC

## 2019-07-19 NOTE — Progress Notes (Signed)
  Echocardiogram 2D Echocardiogram has been performed.  Patricia Wagner 07/19/2019, 2:43 PM

## 2019-07-19 NOTE — Patient Instructions (Signed)
Follow up in 6 months 

## 2019-07-19 NOTE — Progress Notes (Addendum)
Advanced Heart Failure Clinic Note   PCP: Dr Wille Glaser  Primary Cardiologist: Dr. [name] Primary HF: [name] Urologist:  Nephrology: Dr Justin Mend EP:  Dr Curt Bears  HPI: Patricia Wagner is a 68 y.o. female with a past medical history of paroxysmal atrial fibrillation, NICM cardiomyopathy, Biventricular CHF, HTN, chronic foley due to urinary retentin, CKD Stage III, and HLD.   She has struggled with symptomatic afib since 10/2016. Had successful DCCV 10/2016. Failed DCCV 03/2017, after stopping her amiodarone. Seen in A-Fib clinic 04/09/17 and planned for repeat DCCV after further amio load.   Pt admitted to [facility] 6/6 - 05/16/17 with cardiogenic shock in the setting of persistent AF. Had tach CM with EF 15% with severe biventricular dysfunction. Started on amio gtt and PICC line placed for dobutamine with low output HF. Underwent DCCV 05/06/17 but went back into Afib 05/08/17 and developed recurrent cardiogenic shock, requiring re-initiation of dobutamine and amio. Pt had repeat DCCV 05/12/17 with successful conversion to NSR. Discharged on amio and Ranexa.  Last echocardiogram August 2018 showed ejection fraction 30-35%,severe left ventricular hypertrophy, mild to moderate mitral regurgitation, biatrial enlargement and moderately reduced RV function. Fat pad bx negative. PYP 10/18 felt equivocal but not favored for amyloid. Patient has been scheduled for atrial fibrillation ablation January 2019 as she has had cardiogenic shock with atrial fibrillation previously.   Admitted 2/5 through 01/08/2018 with UTI and AKI. Found to have neurogenic bladder with urinary retention .Creatinine peaked at 7. Lasix, spiro, and entresto stopped. Required home IV antibiotics and discharged with foley. At the time of discharge creatinine was down to 1.07.   Admitted 03/05/18 and again on 4/22 with lower GI bleed. Initially was in setting of INR > 10, but transitioned from coumadin to Xarelto after first admission. Pt had  colonoscopy in 11/2017 which showed diverticular disease, so no repeat performed due to supratherapeutic INR. GI consulted. Tagged NM scan 03/15/18 with no active bleeding. Left off of AC for now.   Amyloid work up negative. Fat pad biopsy was negative. PYP scan negative.   Admitted 9/12 through 08/17/18. Hospital course complicated by acute hypoxic respiratory failure and a/c systolic/diastolic heart failure. Required short term intubation and diuresis with IV lasix. Discharged amiodarone taper. Discharge weight  was 215.8 pounds.   She presents today for regular follow up. Doing pretty well. No problems with ADLs. Occasional edema resolved with extra lasix. No orthopnea or PND. No bleeding on Eliquis. Continues with indwelling Foley. At last visit hydralazine increased to 25 tid and didn't tolerate due to low BP. Now on hydralazine 12.5 tid   Echo today EF 45-50%.      ECHO 03/06/2018  Left ventricle: The cavity size was normal. There was severe   concentric hypertrophy. Systolic function was mildly to   moderately reduced. The estimated ejection fraction was in the   range of 40% to 45%. Mild diffuse hypokinesis with no   identifiable regional variations. Doppler parameters are   consistent with abnormal left ventricular relaxation (grade 1   diastolic dysfunction). - Aortic valve: There was mild stenosis. Valve area (VTI): 1.5   cm^2. Valve area (Vmax): 1.59 cm^2. Valve area (Vmean): 1.4 cm^2. - Mitral valve: Calcified annulus. - Left atrium: The atrium was severely dilated. - Right atrium: The atrium was moderately dilated. - Pericardium, extracardiac: There was a left pleural effusion.  Echo from 07/15/17 EF 30-35% Severe LVH. RV down.   Review of systems complete and found to be negative unless listed  in HPI.    Past Medical History:  Diagnosis Date  . Acute blood loss anemia 03/05/2018  . Acute hypercapnic respiratory failure ([place]) 04/30/2017  . Acute renal failure superimposed on  chronic kidney disease ([place]) 10/20/2016  . Arthritis   . Atrial fibrillation ([place])    a. s/p multiple cardioversions; failed tikosyn/sotalol.  . Bradycardia 11/20/2016  . CAD in native artery, s/p cardiac cath with non obstructive CAD 10/24/2016  . Cerebrovascular accident (CVA) due to embolism of left cerebellar artery ([place])   . CHF (congestive heart failure) ([place])   . Chronic anticoagulation 03/15/2018  . Chronic atrial fibrillation 03/05/2018  . Chronic systolic (congestive) heart failure ([place]) 03/05/2018  . CKD (chronic kidney disease) stage 3, GFR 30-59 ml/min (HCC)   . Coagulopathy ([place]) 03/05/2018  . DIVERTICULITIS, HX OF 07/25/2007  . Diverticulosis of colon with hemorrhage 07/22/2007   Qualifier: Diagnosis of  By: [name]    . DIVERTICULOSIS, COLON 07/22/2007  . Dyspnea   . Dysuria 07/21/2017  . Edema, peripheral    a. chronic BLE edema, R>L. Prior trauma from dog attack and accident.  . Essential hypertension 07/22/2007   Qualifier: Diagnosis of  By: [name]    . HLD (hyperlipidemia) 02/03/2008   Qualifier: Diagnosis of  By: [name], [name]   . HYPERLIPIDEMIA 02/03/2008  . Hypersomnia    declines w/u  . Hypertension   . Hypotension (arterial) 04/30/2017  . MENOPAUSAL DISORDER 01/09/2011  . Morbid obesity ([place]) 07/22/2007  . NICM (nonischemic cardiomyopathy) ([place]) 10/24/2016  . PAF (paroxysmal atrial fibrillation) ([place])   . Raynaud's syndrome 07/22/2007  . Stroke ([place]) 2017  . Suspected sleep apnea 05/20/2017  . THYROID NODULE, RIGHT 01/04/2010  . VITAMIN D DEFICIENCY 01/09/2011   Qualifier: Diagnosis of  By: [name], [name]     Current Outpatient Medications  Medication Sig Dispense Refill  . amiodarone (PACERONE) 200 MG tablet Take 1 tablet (200 mg total) by mouth daily. 90 tablet 3  . apixaban (ELIQUIS) 5 MG TABS tablet Take 1 tablet (5 mg total) by mouth 2 (two) times daily. 180 tablet 3  . furosemide (LASIX) 80 MG tablet Take 1 tablet (80 mg total) by mouth  daily. 90 tablet 3  . hydrALAZINE (APRESOLINE) 25 MG tablet Take 1 tablet (25 mg total) by mouth 3 (three) times daily for 30 days. 270 tablet 3  . isosorbide mononitrate (IMDUR) 30 MG 24 hr tablet TAKE HALF A TABLET BY MOUTH EVERY DAY 45 tablet 1  . potassium chloride SA (K-DUR,KLOR-CON) 20 MEQ tablet Take 2 tablets (40 mEq total) by mouth daily. 180 tablet 3  . ranolazine (RANEXA) 500 MG 12 hr tablet Take 1 tablet (500 mg total) by mouth 2 (two) times daily. 180 tablet 3  . rosuvastatin (CRESTOR) 10 MG tablet Take 1 tablet (10 mg total) by mouth daily. 30 tablet 2  . Vitamin D, Ergocalciferol, (DRISDOL) 1.25 MG (50000 UT) CAPS capsule Take 1 capsule (50,000 Units total) by mouth every 7 (seven) days. 12 capsule 0   No current facility-administered medications for this encounter.     Allergies  Allergen Reactions  . Ace Inhibitors Palpitations     Social History   Socioeconomic History  . Marital status: Single    Spouse name: Not on file  . Number of children: 1  . Years of education: Not on file  . Highest education level: Not on file  Occupational History  . Occupation: Engineer, manufacturing  .  Financial resource strain: Not on file  . Food insecurity    Worry: Not on file    Inability: Not on file  . Transportation needs    Medical: Not on file    Non-medical: Not on file  Tobacco Use  . Smoking status: Former Smoker    Packs/day: 0.50    Types: Cigarettes    Start date: 2019  . Smokeless tobacco: Never Used  . Tobacco comment: restarted back this year  Substance and Sexual Activity  . Alcohol use: No    Alcohol/week: 4.0 standard drinks    Types: 4 Cans of beer per week    Frequency: Never    Comment: Intermittent  . Drug use: No  . Sexual activity: Not on file  Lifestyle  . Physical activity    Days per week: Not on file    Minutes per session: Not on file  . Stress: Not on file  Relationships  . Social Herbalist on phone: Not on file     Gets together: Not on file    Attends religious service: Not on file    Active member of club or organization: Not on file    Attends meetings of clubs or organizations: Not on file    Relationship status: Not on file  . Intimate partner violence    Fear of current or ex partner: Not on file    Emotionally abused: Not on file    Physically abused: Not on file    Forced sexual activity: Not on file  Other Topics Concern  . Not on file  Social History Narrative  . Not on file     Family History  Problem Relation Age of Onset  . Asthma Mother   . Diabetes Father   . Heart disease Father        Died of presumed heart attack - 75s  . Lung disease Sister   . Heart disease Sister        Twin sister has heart issue, unclear what kind  . Thyroid disease Neg Hx     Vitals:   07/19/19 1457  BP: (!) 140/94  Pulse: 64  SpO2: 93%  Weight: 98.2 kg (216 lb 6.4 oz)   Wt Readings from Last 3 Encounters:  07/19/19 98.2 kg (216 lb 6.4 oz)  05/10/19 95.3 kg (210 lb)  04/15/19 92.1 kg (203 lb)    PHYSICAL EXAM: General:  Well appearing. No resp difficulty HEENT: normal Neck: supple. no JVD. Carotids 2+ bilat; no bruits. No lymphadenopathy or thryomegaly appreciated. Cor: PMI nondisplaced. Regular rate & rhythm. No rubs, gallops or murmurs. Lungs: clear Abdomen: obese soft, nontender, nondistended. No hepatosplenomegaly. No bruits or masses. Good bowel sounds. Extremities: no cyanosis, clubbing, rash, edema Neuro: alert & orientedx3, cranial nerves grossly intact. moves all 4 extremities w/o difficulty. Affect pleasant   ASSESSMENT & PLAN:  1. Chronic biventricular CHF due to NICM (suspect AF cardiomyopathy) - Echo 02/2018: EF 40-45%, grade 1 DD, RV normal - Echo today 45-50% Personally reviewed - Amyloid w/u negative with negative fat pad biopsy and PYP scan  - Stable NYHA II-early III symptoms.  - Volume status looks good - Continue lasix 80 mg daily. Takes extra as needed  -  Contiunue hydralazine 12.5 mg TID.  (failed uptitration to 25 tid due to low BP) - Continue imdur 15 mg daily - She is unable to afford Bidil - Off spiro and Entresto with AKI/CKD  2. H/o GI  bleed - Denies recurrent bleeding.  - Tagged NM scan 03/15/18 negative - Note per Dr. Hilarie Fredrickson 03/16/18 states likely Diverticular source.  - Continue Eliquis 5 bid   3. PAF:  Cannot tolerate AF with recurrent shock. S/p DCCV on 03/25/17 (she stopped Amio after this) and DCCV on 05/06/17 and again 05/12/17. - EKG shows NSR 64 bpm with 1st degree AV block and occasional PVC.  - She absolutely cannot tolerate AF with recurrent cardiogenic shock when AF recurs. She refused ablation initially but has recently seen Dr. Curt Bears and now waiting to proceed with AF ablation  - Continue Ranexa 500 mg BID.  - Continue amiodarone 200 mg daily.   - CHA2DS2/VASc is at least 7.  - Continue Eliquis 5 mg BID.  - TSH normal and LFTs normal 09/2018. She is aware she needs eye exam daily.   4. Suspected Sleep Apnea: - Refuses sleep study. No change.   5.  COPD:  - No wheeze on exam. No wheezing.  6. CKD IV - Creatinine peaked at 7 during recent hospitalization in February. - Avoid NSAIDs.  - Sees Dr. Justin Mend.  - Recent creatinine 2.1 stable 5/20   7. HTN - BP high here but says well controlled otherwise. Failed up-titration of hydralazine recently.   8. Urinary retention - She follows with urology.  - Has a indwelling Foley without bag, that she can drain.   9. H/o CVA - On Eliquis as above. No chronic defecits. Did have facial droop in setting of admission for shock in 07/2018. MRI that admission with no acute abnormalities, but + small chronic infarctions in right occipital lobe and left superior cerebella hemisphere. + Mild chronic microvascular changes.    Glori Bickers, MD 07/19/19

## 2019-07-21 IMAGING — DX DG CHEST 1V PORT
1 series · 1 of 1 positions shown · non-contrast
Comparison: 08/07/2018

CLINICAL DATA: Hypoxia

EXAM:
PORTABLE CHEST 1 VIEW

[chest ap]
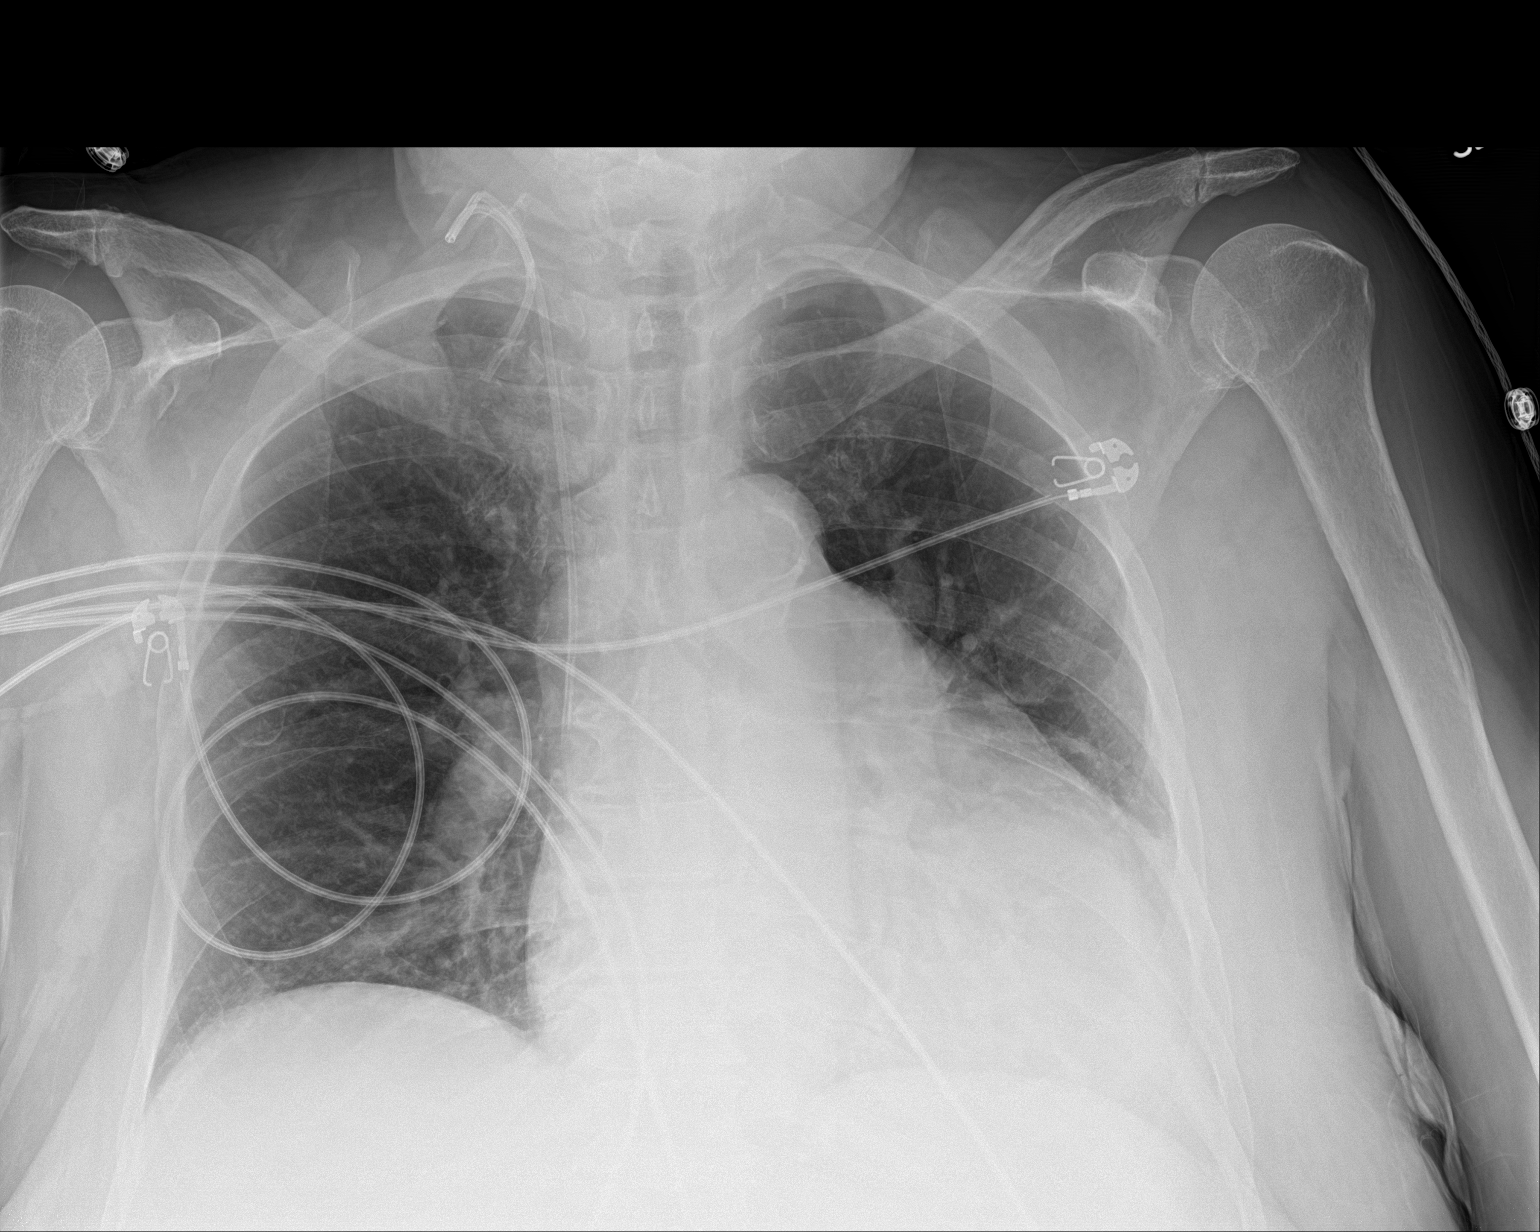

[1 of 1 positions shown; findings below may reference images not displayed]

FINDINGS: Cardiac shadow is again enlarged. Aortic calcifications are again
seen. Right jugular central line is again noted and stable. The
lungs are well aerated bilaterally. Resolution of previously seen
vascular congestion is noted. No focal infiltrate is noted.
IMPRESSION: No acute abnormality seen.

## 2019-07-22 ENCOUNTER — Other Ambulatory Visit: Payer: Self-pay | Admitting: Internal Medicine

## 2019-08-04 DIAGNOSIS — R338 Other retention of urine: Secondary | ICD-10-CM | POA: Diagnosis not present

## 2019-08-04 NOTE — Telephone Encounter (Signed)
Spoke with patient for visit week 75 in the [employer] study.  No peps, aes or saes to report.

## 2019-08-05 ENCOUNTER — Telehealth: Payer: Self-pay | Admitting: *Deleted

## 2019-08-05 NOTE — Telephone Encounter (Signed)
lmtcb to arrange OV to re-discuss scheduling AFib ablation.

## 2019-08-09 NOTE — Telephone Encounter (Signed)
Patient returned your call.

## 2019-08-10 NOTE — Telephone Encounter (Signed)
Pt scheduled for 10/13 to re-discuss if ablation still needed. Patient verbalized understanding and agreeable to plan.

## 2019-08-24 ENCOUNTER — Other Ambulatory Visit: Payer: Self-pay | Admitting: Internal Medicine

## 2019-08-29 DIAGNOSIS — R338 Other retention of urine: Secondary | ICD-10-CM | POA: Diagnosis not present

## 2019-08-29 DIAGNOSIS — N3020 Other chronic cystitis without hematuria: Secondary | ICD-10-CM | POA: Diagnosis not present

## 2019-09-06 ENCOUNTER — Ambulatory Visit: Payer: Medicare HMO | Admitting: Cardiology

## 2019-09-06 ENCOUNTER — Encounter: Payer: Self-pay | Admitting: Cardiology

## 2019-09-06 ENCOUNTER — Other Ambulatory Visit: Payer: Self-pay

## 2019-09-06 VITALS — BP 130/84 | HR 85 | Ht 65.0 in | Wt 218.0 lb

## 2019-09-06 DIAGNOSIS — I4819 Other persistent atrial fibrillation: Secondary | ICD-10-CM | POA: Diagnosis not present

## 2019-09-06 DIAGNOSIS — Z01812 Encounter for preprocedural laboratory examination: Secondary | ICD-10-CM

## 2019-09-06 MED ORDER — METOPROLOL TARTRATE 100 MG PO TABS: 100.0000 mg | ORAL_TABLET | Freq: Once | ORAL | 3 refills | Status: DC

## 2019-09-06 MED ORDER — AMIODARONE HCL 200 MG PO TABS: 200.0000 mg | ORAL_TABLET | Freq: Every day | ORAL | 3 refills | Status: DC

## 2019-09-06 NOTE — Patient Instructions (Signed)
Medication Instructions:  Your physician recommends that you continue on your current medications as directed. Please refer to the Current Medication list given to you today.  *If you need a refill on your cardiac medications before your next appointment, please call your pharmacy.  Labwork: You will get lab work within 30 days of your procedure:  BMP & CBC.  Please return to the office on 09/14/19 If you have labs (blood work) drawn today and your tests are completely normal, you will receive your results only by:  [place] (if you have MyChart) OR  A paper copy in the mail If you have any lab test that is abnormal or we need to change your treatment, we will call you to review the results.  Testing/Procedures: Your physician has requested that you have cardiac CT within 7 days prior to your ablation. Cardiac computed tomography (CT) is a painless test that uses an x-ray machine to take clear, detailed pictures of your heart. For further information please visit [email or web address]. Please follow instruction below located under special instructions. You will get a call from our office to schedule the date for this test.  Your physician has recommended that you have an ablation. Catheter ablation is a medical procedure used to treat some cardiac arrhythmias (irregular heartbeats). During catheter ablation, a long, thin, flexible tube is put into a blood vessel in your groin (upper thigh), or neck. This tube is called an ablation catheter. It is then guided to your heart through the blood vessel. Radio frequency waves destroy small areas of heart tissue where abnormal heartbeats may cause an arrhythmia to start. Please see the instructions below located under special instructions  Follow-Up: Your physician recommends that you schedule a follow-up appointment in: 4 weeks, after your procedure on 10/13/19, with Roderic Palau NP in the AFib clinic.  Your physician recommends that you  schedule a follow up appointment in: 3 months, after your procedure on 10/13/19, with Dr. Curt Bears.  Thank you for choosing [facility]!! Trinidad Curet, RN [phone]   Any Other Special Instructions Will Be Listed Below    CT INSTRUCTIONS Your cardiac CT will be scheduled at:   [facility] [address] [place], [place] [address] [phone]  Please arrive at the [facility] main entrance of [facility] at ___________. Please arrive 30-45 minutes prior to test start time. Proceed to the [facility] Radiology Department (first floor) to check-in and test prep.  Please follow these instructions carefully (unless otherwise directed):  On the Night Before the Test: . Be sure to Drink plenty of water. . Do not consume any caffeinated/decaffeinated beverages or chocolate 12 hours prior to your test. . Do not take any antihistamines 12 hours prior to your test.  On the Day of the Test: . Drink plenty of water. Do not drink any water within one hour of the test. . Do not eat any food 4 hours prior to the test. . You may take your regular medications prior to the test.  . Take metoprolol (Lopressor) 100 mg ONCE two hours prior to test. . HOLD Furosemide/Hydrochlorothiazide morning of the test. . FEMALES- please wear underwire-free bra if available  After the Test: . Drink plenty of water. . After receiving IV contrast, you may experience a mild flushed feeling. This is normal. . On occasion, you may experience a mild rash up to 24 hours after the test. This is not dangerous. If this occurs, you can take Benadryl 25  mg and increase your fluid intake. . If you experience trouble breathing, this can be serious. If it is severe call 911 IMMEDIATELY. If it is mild, please call our office.  Please contact the cardiac imaging nurse navigator should you have any questions/concerns Marchia Bond, RN Navigator Cardiac Imaging [facility]  [phone] Office  [phone] Cell       Electrophysiology/Ablation Procedure Instructions   You are scheduled for a(n)  ablation on 10/13/2019 with Dr. Allegra Lai.   1.   Pre procedure testing-             A.  LAB WORK --- On 09/14/19 for your pre procedure blood work.                 B. COVID TEST-- On 10/10/19 @ 2:00 pm - You will go to [facility] for your Covid testing.   This is a drive thru test site.  There will be multiple testing areas.  Be sure to share with the first checkpoint that you are there for pre-procedure/surgery testing. This will put you into the right (yellow) lane that leads to the PAT testing team. Stay in your car and the nurse team will come to your car to test you.  After you are tested please go home and self quarantine until the day of your procedure.     2. On the day of your procedure 10/13/19 you will go to [facility] [phone]) at 8:30 am.  Dennis Bast will go to the main entrance A [employer]) and enter where the [employer] are.  Your driver will drop you off and you will head down the hallway to ADMITTING.  You may have one support person come in to the hospital with you.  They will be asked to wait in the waiting room.   3.   Do not eat or drink after midnight prior to your procedure.   4.   Do NOT take any medications the morning of your procedure.   5.  Plan for an overnight stay.  If you use your phone frequently bring your phone charger.   6. You will follow up with the AFIB clinic 4 weeks after your procedure.  You will follow up with Dr. Curt Bears  3 months after your procedure.  These appointments will be made for you.   * If you have ANY questions please call the office [phone] and ask for  RN or send me a MyChart message   * Occasionally, EP Studies and ablations can become lengthy.  Please make your family aware of this before your procedure starts.  Average time  ranges from 2-8 hours for EP studies/ablations.  Your physician will call your family after the procedure with the results.                                     Cardiac Ablation  Cardiac ablation is a procedure to stop some heart tissue from causing problems. The heart has many electrical connections. Sometimes these connections make the heart beat very fast or irregularly. Removing some problem areas can improve the heart rhythm or make it normal. What happens before the procedure?  Follow instructions from your doctor about what you cannot eat or drink.  Ask your doctor about: ? Changing or stopping your normal medicines. This  is important if you take diabetes medicines or blood thinners. ? Taking medicines such as aspirin and ibuprofen. These medicines can thin your blood. Do not take these medicines before your procedure if your doctor tells you not to.  Plan to have someone take you home.  If you will be going home right after the procedure, plan to have someone with you for 24 hours. What happens during the procedure?  To lower your risk of infection: ? Your health care team will wash or sanitize their hands. ? Your skin will be washed with soap. ? Hair may be removed from your neck or groin.  An IV tube will be put into one of your veins.  You will be given a medicine to help you relax (sedative).  Skin on your neck or groin will be numbed.  A cut (incision) will be made in your neck or groin.  A needle will be put through your cut and into a vein in your neck or groin.  A tube (catheter) will be put into the needle. The tube will be moved to your heart. X-rays (fluoroscopy) will be used to help guide the tube.  Small devices (electrodes) on the tip of the tube will send out electrical currents.  Dye may be put through the tube. This helps your surgeon see your heart.  Electrical energy will be used to scar (ablate) some heart tissue. Your surgeon may use: ? Heat  (radiofrequency energy). ? Laser energy. ? Extreme cold (cryoablation).  The tube will be taken out.  Pressure will be held on your cut. This helps stop bleeding.  A bandage (dressing) will be put on your cut. The procedure may vary. What happens after the procedure?  You will be monitored until your medicines have worn off.  Your cut will be watched for bleeding. You will need to lie still for a few hours.  Do not drive for 24 hours or as long as your doctor tells you. Summary  Cardiac ablation is a procedure to stop some heart tissue from causing problems.  Electrical energy will be used to scar (ablate) some heart tissue. This information is not intended to replace advice given to you by your health care provider. Make sure you discuss any questions you have with your health care provider. Document Released: 07/13/2013 Document Revised: 10/23/2017 Document Reviewed: 09/29/2016 Elsevier Patient Education  2020 [employer].

## 2019-09-06 NOTE — Addendum Note (Signed)
Addended by: Stanton Kidney on: 09/06/2019 04:24 PM   Modules accepted: Orders

## 2019-09-06 NOTE — Progress Notes (Signed)
Electrophysiology Office Note   Date:  09/06/2019   ID:  [name], Boch Apr 12, 1951, MRN [id number]  PCP:  Biagio Borg, MD  Cardiologist:  [place] Primary Electrophysiologist:  Firas Guardado Meredith Leeds, MD    No chief complaint on file.    History of Present Illness: Patricia Wagner is a 68 y.o. female who is being seen today for the evaluation of CHF, atrial fibrillation at the request of Biagio Borg, MD. Presenting today for electrophysiology evaluation. She has a history of persistent atrial fibrillation, nonischemic cardiomyopathy, CHF, hypertension, and hyperlipidemia. She has had atrial fibrillation since December 2017. She is had multiple cardioversions and has been loaded on amiodarone. She was admitted to the hospital in June 2018 in cardiogenic shock in the setting of persistent atrial fibrillation. EF was found to be 15% with severe bi-V dysfunction. She was started on amiodarone. She was successfully cardioverted to sinus rhythm after initiation of IV amiodarone.Since that time she has done well without recurrence of atrial fibrillation.  Today, denies symptoms of palpitations, chest pain, shortness of breath, orthopnea, PND, lower extremity edema, claudication, dizziness, presyncope, syncope, bleeding, or neurologic sequela. The patient is tolerating medications without difficulties.  Overall she is doing well.  She has noted no further episodes of atrial fibrillation.  That being said, she would prefer to have ablation to get off of amiodarone.  She would benefit from this based on [place] AF.   Past Medical History:  Diagnosis Date  . Acute blood loss anemia 03/05/2018  . Acute hypercapnic respiratory failure ([place]) 04/30/2017  . Acute renal failure superimposed on chronic kidney disease ([place]) 10/20/2016  . Arthritis   . Atrial fibrillation ([place])    a. s/p multiple cardioversions; failed tikosyn/sotalol.  . Bradycardia 11/20/2016  . CAD in native artery, s/p cardiac cath  with non obstructive CAD 10/24/2016  . Cerebrovascular accident (CVA) due to embolism of left cerebellar artery ([place])   . CHF (congestive heart failure) ([place])   . Chronic anticoagulation 03/15/2018  . Chronic atrial fibrillation ([place]) 03/05/2018  . Chronic systolic (congestive) heart failure ([place]) 03/05/2018  . CKD (chronic kidney disease) stage 3, GFR 30-59 ml/min   . Coagulopathy ([place]) 03/05/2018  . DIVERTICULITIS, HX OF 07/25/2007  . Diverticulosis of colon with hemorrhage 07/22/2007   Qualifier: Diagnosis of  By: [name]    . DIVERTICULOSIS, COLON 07/22/2007  . Dyspnea   . Dysuria 07/21/2017  . Edema, peripheral    a. chronic BLE edema, R>L. Prior trauma from dog attack and accident.  . Essential hypertension 07/22/2007   Qualifier: Diagnosis of  By: [name]    . HLD (hyperlipidemia) 02/03/2008   Qualifier: Diagnosis of  By: [name], [name]   . HYPERLIPIDEMIA 02/03/2008  . Hypersomnia    declines w/u  . Hypertension   . Hypotension (arterial) 04/30/2017  . MENOPAUSAL DISORDER 01/09/2011  . Morbid obesity ([place]) 07/22/2007  . NICM (nonischemic cardiomyopathy) ([place]) 10/24/2016  . PAF (paroxysmal atrial fibrillation) ([place])   . Raynaud's syndrome 07/22/2007  . Stroke ([place]) 2017  . Suspected sleep apnea 05/20/2017  . THYROID NODULE, RIGHT 01/04/2010  . VITAMIN D DEFICIENCY 01/09/2011   Qualifier: Diagnosis of  By: [name], [name]    Past Surgical History:  Procedure Laterality Date  . CARDIAC CATHETERIZATION N/A 10/23/2016   Procedure: Left Heart Cath and Coronary Angiography;  Surgeon: [name], MD;  Location: [place] CV [facility];  Service: Cardiovascular;  Laterality: N/A;  .  CARDIOVERSION N/A 10/31/2016   Procedure: CARDIOVERSION;  Surgeon: [name], MD;  Location: [place];  Service: Cardiovascular;  Laterality: N/A;  . CARDIOVERSION N/A 11/03/2016   Procedure: CARDIOVERSION;  Surgeon: [name], MD;  Location: [place];  Service: Cardiovascular;   Laterality: N/A;  . CARDIOVERSION N/A 11/18/2016   Procedure: CARDIOVERSION;  Surgeon: [name], MD;  Location: [place];  Service: Cardiovascular;  Laterality: N/A;  . CARDIOVERSION N/A 03/25/2017   Procedure: CARDIOVERSION;  Surgeon: [name], MD;  Location: [facility] ENDOSCOPY;  Service: Cardiovascular;  Laterality: N/A;  . CARDIOVERSION N/A 05/06/2017   Procedure: CARDIOVERSION;  Surgeon: [name], MD;  Location: [facility] ENDOSCOPY;  Service: Cardiovascular;  Laterality: N/A;  . CARDIOVERSION N/A 05/12/2017   Procedure: CARDIOVERSION;  Surgeon: [name], MD;  Location: [facility] ENDOSCOPY;  Service: Cardiovascular;  Laterality: N/A;  . COLONOSCOPY N/A 12/04/2017   Procedure: COLONOSCOPY;  Surgeon: [name], MD;  Location: [place];  Service: Endoscopy;  Laterality: N/A;  . COLONOSCOPY W/ POLYPECTOMY  02/2011   pan diverticulosis.  tubular adenoma without dysplasia on 5 mm sigmoid polyp.  Dr Fuller Plan.    Marland Kitchen PARTIAL HYSTERECTOMY     1 OVARY LEFT  . TEE WITHOUT CARDIOVERSION N/A 10/31/2016   Procedure: TRANSESOPHAGEAL ECHOCARDIOGRAM (TEE);  Surgeon: [name], MD;  Location: [facility] ENDOSCOPY;  Service: Cardiovascular;  Laterality: N/A;  . TEE WITHOUT CARDIOVERSION N/A 03/25/2017   Procedure: TRANSESOPHAGEAL ECHOCARDIOGRAM (TEE);  Surgeon: [name], MD;  Location: [facility] ENDOSCOPY;  Service: Cardiovascular;  Laterality: N/A;     Current Outpatient Medications  Medication Sig Dispense Refill  . amiodarone (PACERONE) 200 MG tablet Take 1 tablet (200 mg total) by mouth daily. 90 tablet 3  . apixaban (ELIQUIS) 5 MG TABS tablet Take 1 tablet (5 mg total) by mouth 2 (two) times daily. 180 tablet 3  . furosemide (LASIX) 80 MG tablet Take 1 tablet (80 mg total) by mouth 2 (two) times daily. 180 tablet 3  . isosorbide mononitrate (IMDUR) 30 MG 24 hr tablet TAKE HALF A TABLET BY MOUTH EVERY DAY 45 tablet 1  . potassium chloride SA (K-DUR,KLOR-CON) 20 MEQ tablet Take 2 tablets  (40 mEq total) by mouth daily. 180 tablet 3  . ranolazine (RANEXA) 500 MG 12 hr tablet Take 1 tablet (500 mg total) by mouth 2 (two) times daily. 180 tablet 3  . rosuvastatin (CRESTOR) 10 MG tablet TAKE 1 TABLET BY MOUTH EVERY DAY 90 tablet 0  . Vitamin D, Ergocalciferol, (DRISDOL) 1.25 MG (50000 UT) CAPS capsule Take 1 capsule (50,000 Units total) by mouth every 7 (seven) days. 12 capsule 0  . hydrALAZINE (APRESOLINE) 25 MG tablet Take 0.5 tablets (12.5 mg total) by mouth 3 (three) times daily. 270 tablet 3  . [START ON 10/05/2019] metoprolol tartrate (LOPRESSOR) 100 MG tablet Take 1 tablet (100 mg total) by mouth once for 1 dose. Two hours prior to CT testing 180 tablet 3   No current facility-administered medications for this visit.     Allergies:   Ace inhibitors   Social History:  The patient  reports that she has quit smoking. Her smoking use included cigarettes. She started smoking about 21 months ago. She smoked 0.50 packs per day. She has never used smokeless tobacco. She reports that she does not drink alcohol or use drugs.   Family History:  The patient's family history includes Asthma in her mother; Diabetes in her father; Heart disease in her father  and sister; Lung disease in her sister.   ROS:  Please see the history of present illness.   Otherwise, review of systems is positive for none.   All other systems are reviewed and negative.   PHYSICAL EXAM: VS:  BP 130/84   Pulse 85   Ht '5\' 5"'$  (1.651 m)   Wt 218 lb (98.9 kg)   SpO2 95%   BMI 36.28 kg/m  , BMI Body mass index is 36.28 kg/m. GEN: Well nourished, well developed, in no acute distress  HEENT: normal  Neck: no JVD, carotid bruits, or masses Cardiac: RRR; no murmurs, rubs, or gallops,no edema  Respiratory:  clear to auscultation bilaterally, normal work of breathing GI: soft, nontender, nondistended, + BS MS: no deformity or atrophy  Skin: warm and dry Neuro:  Strength and sensation are intact Psych: euthymic  mood, full affect  EKG:  EKG is ordered today. Personal review of the ekg ordered shows sinus rhythm, rate 85, PVC  Recent Labs: 04/15/2019: ALT 7; BUN 32; Creatinine, Ser 2.09; Hemoglobin 13.9; Platelets 185.0; Potassium 4.1; Sodium 138; TSH 0.54    Lipid Panel     Component Value Date/Time   CHOL 182 04/15/2019 1549   TRIG 107.0 04/15/2019 1549   HDL 74.30 04/15/2019 1549   CHOLHDL 2 04/15/2019 1549   VLDL 21.4 04/15/2019 1549   LDLCALC 86 04/15/2019 1549   LDLDIRECT 121.6 03/15/2012 1646     Wt Readings from Last 3 Encounters:  09/06/19 218 lb (98.9 kg)  07/19/19 216 lb 6.4 oz (98.2 kg)  05/10/19 210 lb (95.3 kg)      Other studies Reviewed: Additional studies/ records that were reviewed today include: TTE 07/19/19  Review of the above records today demonstrates:   1. The left ventricle has mildly reduced systolic function, with an ejection fraction of 45-50%. The cavity size was normal. There is severe concentric left ventricular hypertrophy. Left ventricular diastolic Doppler parameters are consistent with  impaired relaxation.  2. The right ventricle has normal systolic function. The cavity was normal. There is no increase in right ventricular wall thickness.  3. Left atrial size was mildly dilated.  4. The aortic valve is tricuspid. Mild calcification of the aortic valve. No aortic stenosis.  LHC 10/23/16 1. Mild, non-obstructive coronary artery disease consistent with non-ischemic cardiomyopathy. 2. Mildly elevated left ventricular filling pressure.   ASSESSMENT AND PLAN:  1.  Persistent atrial fibrillation: Currently on Eliquis and amiodarone.  At this point, she would prefer to have ablation for her atrial fibrillation.  Risks and benefits were discussed and include bleeding, tamponade, heart block, stroke, damage surrounding organs.  She understands these risks and is agreed to the procedure.  This patients CHA2DS2-VASc Score and unadjusted Ischemic Stroke Rate  (% per year) is equal to 3.2 % stroke rate/year from a score of 3  Above score calculated as 1 point each if present [CHF, HTN, DM, Vascular=MI/PAD/Aortic Plaque, Age if 65-74, or Female] Above score calculated as 2 points each if present [Age > 75, or Stroke/TIA/TE]  2.  Chronic systolic heart failure due to nonischemic cardiomyopathy: Potentially due to tachycardia mediated myopathy due to atrial fibrillation.  Echo 40 to 45%.  Plan per heart failure cardiology.   Current medicines are reviewed at length with the patient today.   The patient does not have concerns regarding her medicines.  The following changes were made today: None  Labs/ tests ordered today include:  Orders Placed This Encounter  Procedures  .  CT CARDIAC MORPH/PULM VEIN W/CM&W/O CA SCORE  . CT CORONARY FRACTIONAL FLOW RESERVE DATA PREP  . CT CORONARY FRACTIONAL FLOW RESERVE FLUID ANALYSIS  . Comp Met (CMET)  . CBC  . TSH  . EKG 12-Lead     Disposition:   FU with Sonya Gunnoe 3 months  Signed, Lyan Holck Meredith Leeds, MD  09/06/2019 4:18 PM     [address] [place]  [phone] (office) [phone] (fax)

## 2019-09-07 ENCOUNTER — Other Ambulatory Visit: Payer: Self-pay

## 2019-09-07 ENCOUNTER — Encounter: Payer: Self-pay | Admitting: Internal Medicine

## 2019-09-07 ENCOUNTER — Ambulatory Visit (INDEPENDENT_AMBULATORY_CARE_PROVIDER_SITE_OTHER): Payer: Medicare HMO | Admitting: Internal Medicine

## 2019-09-07 VITALS — BP 124/68 | HR 82 | Ht 65.0 in | Wt 216.0 lb

## 2019-09-07 DIAGNOSIS — E785 Hyperlipidemia, unspecified: Secondary | ICD-10-CM | POA: Diagnosis not present

## 2019-09-07 DIAGNOSIS — J309 Allergic rhinitis, unspecified: Secondary | ICD-10-CM

## 2019-09-07 DIAGNOSIS — Z23 Encounter for immunization: Secondary | ICD-10-CM

## 2019-09-07 DIAGNOSIS — E559 Vitamin D deficiency, unspecified: Secondary | ICD-10-CM

## 2019-09-07 DIAGNOSIS — H9202 Otalgia, left ear: Secondary | ICD-10-CM | POA: Diagnosis not present

## 2019-09-07 DIAGNOSIS — I10 Essential (primary) hypertension: Secondary | ICD-10-CM | POA: Diagnosis not present

## 2019-09-07 MED ORDER — AZITHROMYCIN 250 MG PO TABS: ORAL_TABLET | ORAL | 1 refills | Status: DC

## 2019-09-07 MED ORDER — ROSUVASTATIN CALCIUM 10 MG PO TABS: 10.0000 mg | ORAL_TABLET | Freq: Every day | ORAL | 1 refills | Status: DC

## 2019-09-07 MED ORDER — ISOSORBIDE MONONITRATE ER 30 MG PO TB24: ORAL_TABLET | ORAL | 1 refills | Status: DC

## 2019-09-07 MED ORDER — ROSUVASTATIN CALCIUM 10 MG PO TABS: 10.0000 mg | ORAL_TABLET | Freq: Every day | ORAL | 3 refills | Status: DC

## 2019-09-07 NOTE — Patient Instructions (Addendum)
OK to take the Vitamin D3 at 2000 units per day  Please also take mucinex otc, and nasacort OTC for the ears and sinuses  Ok to try the Coenzyme Q10 as you mentioned  Please continue all other medications as before, and refills have been done if requested.  Please have the pharmacy call with any other refills you may need.  Please continue your efforts at being more active, low cholesterol diet, and weight control.  Please keep your appointments with your specialists as you may have planned

## 2019-09-07 NOTE — Progress Notes (Addendum)
Subjective:    Patient ID: GRISELA MESCH, female    DOB: [birth year], 68 y.o.   MRN: [id number]  HPI  Here to f/u; overall doing ok,  Pt denies chest pain, increasing sob or doe, wheezing, orthopnea, PND, increased LE swelling, palpitations, dizziness or syncope.  Pt denies new neurological symptoms such as new headache, or facial or extremity weakness or numbness.  Pt denies polydipsia, polyuria, or low sugar episode.  Pt states overall good compliance with meds, mostly trying to follow appropriate diet, with wt overall stable.  Has been taking the weekly Vit D 50K and now ready to change to daily otc oral.  Does have several wks ongoing nasal allergy symptoms with clearish congestion, itch and sneezing, without fever, pain, ST, cough, swelling or wheezing, except now also has worsening left ear pain, pressure and low grade temp for several days Past Medical History:  Diagnosis Date  . Acute blood loss anemia 03/05/2018  . Acute hypercapnic respiratory failure ([place]) 04/30/2017  . Acute renal failure superimposed on chronic kidney disease ([place]) 10/20/2016  . Arthritis   . Atrial fibrillation ([place])    a. s/p multiple cardioversions; failed tikosyn/sotalol.  . Bradycardia 11/20/2016  . CAD in native artery, s/p cardiac cath with non obstructive CAD 10/24/2016  . Cerebrovascular accident (CVA) due to embolism of left cerebellar artery ([place])   . CHF (congestive heart failure) ([place])   . Chronic anticoagulation 03/15/2018  . Chronic atrial fibrillation ([place]) 03/05/2018  . Chronic systolic (congestive) heart failure ([place]) 03/05/2018  . CKD (chronic kidney disease) stage 3, GFR 30-59 ml/min   . Coagulopathy ([place]) 03/05/2018  . DIVERTICULITIS, HX OF 07/25/2007  . Diverticulosis of colon with hemorrhage 07/22/2007   Qualifier: Diagnosis of  By: [name]    . DIVERTICULOSIS, COLON 07/22/2007  . Dyspnea   . Dysuria 07/21/2017  . Edema, peripheral    a. chronic BLE edema, R>L. Prior trauma from dog  attack and accident.  . Essential hypertension 07/22/2007   Qualifier: Diagnosis of  By: [name]    . HLD (hyperlipidemia) 02/03/2008   Qualifier: Diagnosis of  By: [name], [name]   . HYPERLIPIDEMIA 02/03/2008  . Hypersomnia    declines w/u  . Hypertension   . Hypotension (arterial) 04/30/2017  . MENOPAUSAL DISORDER 01/09/2011  . Morbid obesity ([place]) 07/22/2007  . NICM (nonischemic cardiomyopathy) ([place]) 10/24/2016  . PAF (paroxysmal atrial fibrillation) ([place])   . Raynaud's syndrome 07/22/2007  . Stroke ([place]) 2017  . Suspected sleep apnea 05/20/2017  . THYROID NODULE, RIGHT 01/04/2010  . VITAMIN D DEFICIENCY 01/09/2011   Qualifier: Diagnosis of  By: [name], [name]    Past Surgical History:  Procedure Laterality Date  . CARDIAC CATHETERIZATION N/A 10/23/2016   Procedure: Left Heart Cath and Coronary Angiography;  Surgeon: [name], MD;  Location: [place] [facility] LAB;  Service: Cardiovascular;  Laterality: N/A;  . CARDIOVERSION N/A 10/31/2016   Procedure: CARDIOVERSION;  Surgeon: [name], MD;  Location: [facility] ENDOSCOPY;  Service: Cardiovascular;  Laterality: N/A;  . CARDIOVERSION N/A 11/03/2016   Procedure: CARDIOVERSION;  Surgeon: [name], MD;  Location: [facility] ENDOSCOPY;  Service: Cardiovascular;  Laterality: N/A;  . CARDIOVERSION N/A 11/18/2016   Procedure: CARDIOVERSION;  Surgeon: [name], MD;  Location: [facility] ENDOSCOPY;  Service: Cardiovascular;  Laterality: N/A;  . CARDIOVERSION N/A 03/25/2017   Procedure: CARDIOVERSION;  Surgeon: [name], MD;  Location: [place];  Service: Cardiovascular;  Laterality: N/A;  .  CARDIOVERSION N/A 05/06/2017   Procedure: CARDIOVERSION;  Surgeon: [name], MD;  Location: [facility] ENDOSCOPY;  Service: Cardiovascular;  Laterality: N/A;  . CARDIOVERSION N/A 05/12/2017   Procedure: CARDIOVERSION;  Surgeon: [name], MD;  Location: [facility] ENDOSCOPY;  Service: Cardiovascular;  Laterality: N/A;  . COLONOSCOPY N/A  12/04/2017   Procedure: COLONOSCOPY;  Surgeon: [name], MD;  Location: [facility] ENDOSCOPY;  Service: Endoscopy;  Laterality: N/A;  . COLONOSCOPY W/ POLYPECTOMY  02/2011   pan diverticulosis.  tubular adenoma without dysplasia on 5 mm sigmoid polyp.  Dr Fuller Plan.    Marland Kitchen PARTIAL HYSTERECTOMY     1 OVARY LEFT  . TEE WITHOUT CARDIOVERSION N/A 10/31/2016   Procedure: TRANSESOPHAGEAL ECHOCARDIOGRAM (TEE);  Surgeon: [name], MD;  Location: [facility] ENDOSCOPY;  Service: Cardiovascular;  Laterality: N/A;  . TEE WITHOUT CARDIOVERSION N/A 03/25/2017   Procedure: TRANSESOPHAGEAL ECHOCARDIOGRAM (TEE);  Surgeon: [name], MD;  Location: [facility] ENDOSCOPY;  Service: Cardiovascular;  Laterality: N/A;    reports that she has quit smoking. Her smoking use included cigarettes. She started smoking about 21 months ago. She smoked 0.50 packs per day. She has never used smokeless tobacco. She reports that she does not drink alcohol or use drugs. family history includes Asthma in her mother; Diabetes in her father; Heart disease in her father and sister; Lung disease in her sister. Allergies  Allergen Reactions  . Ace Inhibitors Palpitations   Review of Systems  Constitutional: Negative for other unusual diaphoresis or sweats HENT: Negative for ear discharge or swelling Eyes: Negative for other worsening visual disturbances Respiratory: Negative for stridor or other swelling  Gastrointestinal: Negative for worsening distension or other blood Genitourinary: Negative for retention or other urinary change Musculoskeletal: Negative for other MSK pain or swelling Skin: Negative for color change or other new lesions Neurological: Negative for worsening tremors and other numbness  Psychiatric/Behavioral: Negative for worsening agitation or other fatigue All otherwise neg per pt    Objective:   Physical Exam BP 124/68   Pulse 82   Ht 5\' 5"  (1.651 m)   Wt 216 lb (98 kg)   SpO2 98%   BMI 35.94 kg/m  VS noted,   Constitutional: Pt appears in NAD HENT: Head: NCAT.  Right Ear: External ear normal.  Left Ear: External ear normal.  Left TM with marked erythema Bilat tm's with mild erythema.  Max sinus areas mild tender.  Pharynx with mild erythema, no exudate Eyes: . Pupils are equal, round, and reactive to light. Conjunctivae and EOM are normal Nose: without d/c or deformity Neck: Neck supple. Gross normal ROM Cardiovascular: Normal rate and regular rhythm.   Pulmonary/Chest: Effort normal and breath sounds without rales or wheezing.  Abd:  Soft, NT, ND, + BS, no organomegaly Neurological: Pt is alert. At baseline orientation, motor grossly intact Skin: Skin is warm. No rashes, other new lesions, no LE edema Psychiatric: Pt behavior is normal without agitation  All otherwise neg per pt  Lab Results  Component Value Date   WBC 7.9 04/15/2019   HGB 13.9 04/15/2019   HCT 41.8 04/15/2019   PLT 185.0 04/15/2019   GLUCOSE 92 04/15/2019   CHOL 182 04/15/2019   TRIG 107.0 04/15/2019   HDL 74.30 04/15/2019   LDLDIRECT 121.6 03/15/2012   LDLCALC 86 04/15/2019   ALT 7 04/15/2019   AST 12 04/15/2019   NA 138 04/15/2019   K 4.1 04/15/2019   CL 96 04/15/2019   CREATININE 2.09 (H) 04/15/2019  BUN 32 (H) 04/15/2019   CO2 34 (H) 04/15/2019   TSH 0.54 04/15/2019   INR 2.46 08/05/2018   HGBA1C 6.4 (H) 08/06/2018      Assessment & Plan:

## 2019-09-10 ENCOUNTER — Encounter: Payer: Self-pay | Admitting: Internal Medicine

## 2019-09-10 DIAGNOSIS — H9202 Otalgia, left ear: Secondary | ICD-10-CM | POA: Insufficient documentation

## 2019-09-10 DIAGNOSIS — J309 Allergic rhinitis, unspecified: Secondary | ICD-10-CM | POA: Insufficient documentation

## 2019-09-10 NOTE — Assessment & Plan Note (Signed)
[place] for change to otc vit d3 2000 qd

## 2019-09-10 NOTE — Assessment & Plan Note (Signed)
Mild to mod, for nasacort asd,  to f/u any worsening symptoms or concerns 

## 2019-09-10 NOTE — Assessment & Plan Note (Signed)
Mild uncontrolled, declines change in tx, for lower chol diet

## 2019-09-10 NOTE — Assessment & Plan Note (Signed)
With possible ear otitis media, for azithro course asd, mucinex otc bid,  to f/u any worsening symptoms or concerns

## 2019-09-10 NOTE — Assessment & Plan Note (Signed)
stable overall by history and exam, recent data reviewed with pt, and pt to continue medical treatment as before,  to f/u any worsening symptoms or concerns  

## 2019-09-13 ENCOUNTER — Other Ambulatory Visit: Payer: Medicare HMO

## 2019-09-14 ENCOUNTER — Other Ambulatory Visit: Payer: Self-pay

## 2019-09-14 ENCOUNTER — Other Ambulatory Visit: Payer: Medicare HMO

## 2019-09-14 DIAGNOSIS — I4819 Other persistent atrial fibrillation: Secondary | ICD-10-CM | POA: Diagnosis not present

## 2019-09-14 DIAGNOSIS — M170 Bilateral primary osteoarthritis of knee: Secondary | ICD-10-CM | POA: Diagnosis not present

## 2019-09-14 DIAGNOSIS — Z01812 Encounter for preprocedural laboratory examination: Secondary | ICD-10-CM | POA: Diagnosis not present

## 2019-09-15 ENCOUNTER — Other Ambulatory Visit: Payer: Self-pay | Admitting: Internal Medicine

## 2019-09-15 DIAGNOSIS — R7989 Other specified abnormal findings of blood chemistry: Secondary | ICD-10-CM

## 2019-09-15 LAB — COMPREHENSIVE METABOLIC PANEL
ALT: 6 IU/L (ref 0–32)
AST: 14 IU/L (ref 0–40)
Albumin/Globulin Ratio: 1.2 (ref 1.2–2.2)
Albumin: 4 g/dL (ref 3.8–4.8)
Alkaline Phosphatase: 71 IU/L (ref 39–117)
BUN/Creatinine Ratio: 18 (ref 12–28)
BUN: 40 mg/dL — ABNORMAL HIGH (ref 8–27)
Bilirubin Total: 0.5 mg/dL (ref 0.0–1.2)
CO2: 28 mmol/L (ref 20–29)
Calcium: 9.8 mg/dL (ref 8.7–10.3)
Chloride: 96 mmol/L (ref 96–106)
Creatinine, Ser: 2.24 mg/dL — ABNORMAL HIGH (ref 0.57–1.00)
GFR calc Af Amer: 26 mL/min/{1.73_m2} — ABNORMAL LOW (ref >59)
GFR calc non Af Amer: 22 mL/min/{1.73_m2} — ABNORMAL LOW (ref >59)
Globulin, Total: 3.4 g/dL (ref 1.5–4.5)
Glucose: 95 mg/dL (ref 65–99)
Potassium: 3.8 mmol/L (ref 3.5–5.2)
Sodium: 141 mmol/L (ref 134–144)
Total Protein: 7.4 g/dL (ref 6.0–8.5)

## 2019-09-15 LAB — CBC
Hematocrit: 41.8 % (ref 34.0–46.6)
Hemoglobin: 13.3 g/dL (ref 11.1–15.9)
MCH: 29.6 pg (ref 26.6–33.0)
MCHC: 31.8 g/dL (ref 31.5–35.7)
MCV: 93 fL (ref 79–97)
Platelets: 208 10*3/uL (ref 150–450)
RBC: 4.5 x10E6/uL (ref 3.77–5.28)
RDW: 13.1 % (ref 11.7–15.4)
WBC: 8 10*3/uL (ref 3.4–10.8)

## 2019-09-15 LAB — TSH: TSH: 0.43 u[IU]/mL — ABNORMAL LOW (ref 0.450–4.500)

## 2019-09-19 DIAGNOSIS — R338 Other retention of urine: Secondary | ICD-10-CM | POA: Diagnosis not present

## 2019-09-26 ENCOUNTER — Other Ambulatory Visit: Payer: Self-pay

## 2019-09-26 ENCOUNTER — Encounter: Payer: Self-pay | Admitting: Cardiology

## 2019-09-26 ENCOUNTER — Telehealth (INDEPENDENT_AMBULATORY_CARE_PROVIDER_SITE_OTHER): Payer: Medicare HMO | Admitting: Cardiology

## 2019-09-26 VITALS — Ht 65.0 in | Wt 215.0 lb

## 2019-09-26 DIAGNOSIS — I4819 Other persistent atrial fibrillation: Secondary | ICD-10-CM

## 2019-09-26 DIAGNOSIS — I480 Paroxysmal atrial fibrillation: Secondary | ICD-10-CM

## 2019-09-26 DIAGNOSIS — N3020 Other chronic cystitis without hematuria: Secondary | ICD-10-CM | POA: Diagnosis not present

## 2019-09-26 DIAGNOSIS — R338 Other retention of urine: Secondary | ICD-10-CM | POA: Diagnosis not present

## 2019-09-26 NOTE — Progress Notes (Signed)
Electrophysiology TeleHealth Note   Due to national recommendations of social distancing due to COVID 19, an audio/video telehealth visit is felt to be most appropriate for this patient at this time.  See Epic message for the patient's consent to telehealth for [facility].   Date:  09/26/2019   ID:  Patricia Wagner, DOB 04/20/1951, MRN [id number]  Location: patient's home  Provider location: [address], [place]  Evaluation Performed: Follow-up visit  PCP:  Biagio Borg, MD  Cardiologist:  Glori Bickers, MD  Electrophysiologist:  Dr Curt Bears  Chief Complaint:  AF  History of Present Illness:    Patricia Wagner is a 68 y.o. female who presents via audio/video conferencing for a telehealth visit today.  Since last being seen in our clinic, the patient reports doing very well.  Today, she denies symptoms of palpitations, chest pain, shortness of breath,  lower extremity edema, dizziness, presyncope, or syncope.  The patient is otherwise without complaint today.  The patient denies symptoms of fevers, chills, cough, or new SOB worrisome for COVID 19.  She has a history of persistent atrial fibrillation, nonischemic cardiomyopathy, hypertension, hyperlipidemia.  She is planned for atrial fibrillation ablation on 10/13/2019.  Today, denies symptoms of palpitations, chest pain, shortness of breath, orthopnea, PND, lower extremity edema, claudication, dizziness, presyncope, syncope, bleeding, or neurologic sequela. The patient is tolerating medications without difficulties.    Past Medical History:  Diagnosis Date  . Acute blood loss anemia 03/05/2018  . Acute hypercapnic respiratory failure ([place]) 04/30/2017  . Acute renal failure superimposed on chronic kidney disease ([place]) 10/20/2016  . Arthritis   . Atrial fibrillation ([place])    a. s/p multiple cardioversions; failed tikosyn/sotalol.  . Bradycardia 11/20/2016  . CAD in native artery, s/p cardiac cath with non  obstructive CAD 10/24/2016  . Cerebrovascular accident (CVA) due to embolism of left cerebellar artery ([place])   . CHF (congestive heart failure) ([place])   . Chronic anticoagulation 03/15/2018  . Chronic atrial fibrillation ([place]) 03/05/2018  . Chronic systolic (congestive) heart failure ([place]) 03/05/2018  . CKD (chronic kidney disease) stage 3, GFR 30-59 ml/min   . Coagulopathy ([place]) 03/05/2018  . DIVERTICULITIS, HX OF 07/25/2007  . Diverticulosis of colon with hemorrhage 07/22/2007   Qualifier: Diagnosis of  By: [name]    . DIVERTICULOSIS, COLON 07/22/2007  . Dyspnea   . Dysuria 07/21/2017  . Edema, peripheral    a. chronic BLE edema, R>L. Prior trauma from dog attack and accident.  . Essential hypertension 07/22/2007   Qualifier: Diagnosis of  By: [name]    . HLD (hyperlipidemia) 02/03/2008   Qualifier: Diagnosis of  By: [name], [name]   . HYPERLIPIDEMIA 02/03/2008  . Hypersomnia    declines w/u  . Hypertension   . Hypotension (arterial) 04/30/2017  . MENOPAUSAL DISORDER 01/09/2011  . Morbid obesity ([place]) 07/22/2007  . NICM (nonischemic cardiomyopathy) ([place]) 10/24/2016  . PAF (paroxysmal atrial fibrillation) ([place])   . Raynaud's syndrome 07/22/2007  . Stroke ([place]) 2017  . Suspected sleep apnea 05/20/2017  . THYROID NODULE, RIGHT 01/04/2010  . VITAMIN D DEFICIENCY 01/09/2011   Qualifier: Diagnosis of  By: [name], [name]     Past Surgical History:  Procedure Laterality Date  . CARDIAC CATHETERIZATION N/A 10/23/2016   Procedure: Left Heart Cath and Coronary Angiography;  Surgeon: [name], MD;  Location: [facility];  Service: Cardiovascular;  Laterality: N/A;  . CARDIOVERSION N/A 10/31/2016  Procedure: CARDIOVERSION;  Surgeon: [name], MD;  Location: [facility] ENDOSCOPY;  Service: Cardiovascular;  Laterality: N/A;  . CARDIOVERSION N/A 11/03/2016   Procedure: CARDIOVERSION;  Surgeon: [name], MD;  Location: [facility];  Service: Cardiovascular;   Laterality: N/A;  . CARDIOVERSION N/A 11/18/2016   Procedure: CARDIOVERSION;  Surgeon: [name], MD;  Location: [facility];  Service: Cardiovascular;  Laterality: N/A;  . CARDIOVERSION N/A 03/25/2017   Procedure: CARDIOVERSION;  Surgeon: [name], MD;  Location: [facility] ENDOSCOPY;  Service: Cardiovascular;  Laterality: N/A;  . CARDIOVERSION N/A 05/06/2017   Procedure: CARDIOVERSION;  Surgeon: [name], MD;  Location: [facility] ENDOSCOPY;  Service: Cardiovascular;  Laterality: N/A;  . CARDIOVERSION N/A 05/12/2017   Procedure: CARDIOVERSION;  Surgeon: [name], MD;  Location: [facility] ENDOSCOPY;  Service: Cardiovascular;  Laterality: N/A;  . COLONOSCOPY N/A 12/04/2017   Procedure: COLONOSCOPY;  Surgeon: [name], MD;  Location: [facility];  Service: Endoscopy;  Laterality: N/A;  . COLONOSCOPY W/ POLYPECTOMY  02/2011   pan diverticulosis.  tubular adenoma without dysplasia on 5 mm sigmoid polyp.  Dr Fuller Plan.    Marland Kitchen PARTIAL HYSTERECTOMY     1 OVARY LEFT  . TEE WITHOUT CARDIOVERSION N/A 10/31/2016   Procedure: TRANSESOPHAGEAL ECHOCARDIOGRAM (TEE);  Surgeon: [name], MD;  Location: [facility] ENDOSCOPY;  Service: Cardiovascular;  Laterality: N/A;  . TEE WITHOUT CARDIOVERSION N/A 03/25/2017   Procedure: TRANSESOPHAGEAL ECHOCARDIOGRAM (TEE);  Surgeon: [name], MD;  Location: [facility] ENDOSCOPY;  Service: Cardiovascular;  Laterality: N/A;    Current Outpatient Medications  Medication Sig Dispense Refill  . amiodarone (PACERONE) 200 MG tablet Take 1 tablet (200 mg total) by mouth daily. 90 tablet 3  . apixaban (ELIQUIS) 5 MG TABS tablet Take 1 tablet (5 mg total) by mouth 2 (two) times daily. 180 tablet 3  . azithromycin (ZITHROMAX Z-PAK) 250 MG tablet 2 tab by mouth day 1, then 1 per day 6 tablet 1  . furosemide (LASIX) 80 MG tablet Take 1 tablet (80 mg total) by mouth 2 (two) times daily. 180 tablet 3  . isosorbide mononitrate (IMDUR) 30 MG 24 hr tablet TAKE HALF A TABLET BY MOUTH  EVERY DAY 45 tablet 1  . [START ON 10/05/2019] metoprolol tartrate (LOPRESSOR) 100 MG tablet Take 1 tablet (100 mg total) by mouth once for 1 dose. Two hours prior to CT testing 180 tablet 3  . potassium chloride SA (K-DUR,KLOR-CON) 20 MEQ tablet Take 2 tablets (40 mEq total) by mouth daily. 180 tablet 3  . ranolazine (RANEXA) 500 MG 12 hr tablet Take 1 tablet (500 mg total) by mouth 2 (two) times daily. 180 tablet 3  . rosuvastatin (CRESTOR) 10 MG tablet Take 1 tablet (10 mg total) by mouth daily. 90 tablet 3  . Vitamin D, Ergocalciferol, (DRISDOL) 1.25 MG (50000 UT) CAPS capsule Take 1 capsule (50,000 Units total) by mouth every 7 (seven) days. 12 capsule 0  . hydrALAZINE (APRESOLINE) 25 MG tablet Take 0.5 tablets (12.5 mg total) by mouth 3 (three) times daily. 270 tablet 3   No current facility-administered medications for this visit.     Allergies:   Ace inhibitors   Social History:  The patient  reports that she has quit smoking. Her smoking use included cigarettes. She started smoking about 22 months ago. She smoked 0.50 packs per day. She has never used smokeless tobacco. She reports that she does not drink alcohol or use drugs.   Family History:  The  patient's  family history includes Asthma in her mother; Diabetes in her father; Heart disease in her father and sister; Lung disease in her sister.   ROS:  Please see the history of present illness.   All other systems are personally reviewed and negative.    Exam:    Vital Signs:  Ht 5\' 5"  (1.651 m)   Wt 215 lb (97.5 kg)   BMI 35.78 kg/m   no acute distress, no shortness of breath.  Labs/Other Tests and Data Reviewed:    Recent Labs: 09/14/2019: ALT 6; BUN 40; Creatinine, Ser 2.24; Hemoglobin 13.3; Platelets 208; Potassium 3.8; Sodium 141; TSH 0.430   Wt Readings from Last 3 Encounters:  09/26/19 215 lb (97.5 kg)  09/07/19 216 lb (98 kg)  09/06/19 218 lb (98.9 kg)     Other studies personally reviewed: Additional  studies/ records that were reviewed today include: ECG 09/06/2019 personally reviewed Review of the above records today demonstrates: Sinus rhythm, rate 85, PVC  ASSESSMENT & PLAN:    1.  Persistent atrial fibrillation: Currently on Eliquis and amiodarone.  We Elijio Staples plan for ablation 10/13/2019.  Risks and benefits were discussed.  Risks include bleeding, tamponade, heart block, stroke, damage surrounding organs.  She understands these risks and is agreed to the procedure.   COVID 19 screen The patient denies symptoms of COVID 19 at this time.  The importance of social distancing was discussed today.  Follow-up:  3 months   Current medicines are reviewed at length with the patient today.   The patient does not have concerns regarding her medicines.  The following changes were made today:  none  Labs/ tests ordered today include:  No orders of the defined types were placed in this encounter.    Patient Risk:  after full review of this patients clinical status, I feel that they are at moderate risk at this time.  Today, I have spent 8 minutes with the patient with telehealth technology discussing AF .    Signed, Nuel Dejaynes Meredith Leeds, MD  09/26/2019 4:06 PM     [place] [address] [phone] (office) [phone] (fax)

## 2019-10-01 DIAGNOSIS — M25562 Pain in left knee: Secondary | ICD-10-CM | POA: Diagnosis not present

## 2019-10-04 NOTE — Telephone Encounter (Signed)
Late entry Subject dropped medication off at front deck. I called patient and patient stated "I'm done with the study. I'm tired of taking all this medication. I have not taken any of the medication since I was discharged form the hospital." pt agreed to stay in the study off IP.

## 2019-10-05 ENCOUNTER — Telehealth: Payer: Self-pay | Admitting: *Deleted

## 2019-10-05 NOTE — Telephone Encounter (Signed)
Pt returns my call. Informed pt CT testing tomorrow cancelled d/t kidney function and educated pt. Pt confirms that she has not missed any of her blood thinner. Advised to not miss any doses going forward.  Advised not to take it morning of procedure. Advised to keep Covid screening appt. Patient verbalized understanding and agreeable to plan.

## 2019-10-06 ENCOUNTER — Ambulatory Visit (HOSPITAL_COMMUNITY): Payer: Medicare HMO

## 2019-10-10 ENCOUNTER — Other Ambulatory Visit (HOSPITAL_COMMUNITY)
Admission: RE | Admit: 2019-10-10 | Discharge: 2019-10-10 | Disposition: A | Discharge status: Home / Self Care | Payer: Medicare HMO | Source: Ambulatory Visit | Attending: Cardiology | Admitting: Cardiology

## 2019-10-10 DIAGNOSIS — Z20828 Contact with and (suspected) exposure to other viral communicable diseases: Secondary | ICD-10-CM | POA: Insufficient documentation

## 2019-10-10 DIAGNOSIS — Z01812 Encounter for preprocedural laboratory examination: Secondary | ICD-10-CM | POA: Diagnosis not present

## 2019-10-12 LAB — NOVEL CORONAVIRUS, NAA (HOSP ORDER, SEND-OUT TO REF LAB; TAT 18-24 HRS): SARS-CoV-2, NAA: NOT DETECTED

## 2019-10-13 ENCOUNTER — Other Ambulatory Visit: Payer: Self-pay

## 2019-10-13 ENCOUNTER — Ambulatory Visit (HOSPITAL_COMMUNITY): Payer: Medicare HMO | Admitting: Anesthesiology

## 2019-10-13 ENCOUNTER — Encounter (HOSPITAL_COMMUNITY)
Admission: RE | Disposition: A | Discharge status: Home / Self Care | Payer: Self-pay | Source: Home / Self Care | Attending: Cardiology

## 2019-10-13 ENCOUNTER — Ambulatory Visit (HOSPITAL_COMMUNITY)
Admission: RE | Admit: 2019-10-13 | Discharge: 2019-10-14 | Disposition: A | Discharge status: Home / Self Care | Payer: Medicare HMO | Attending: Cardiology | Admitting: Cardiology

## 2019-10-13 ENCOUNTER — Encounter (HOSPITAL_COMMUNITY): Payer: Self-pay

## 2019-10-13 DIAGNOSIS — N179 Acute kidney failure, unspecified: Secondary | ICD-10-CM | POA: Diagnosis not present

## 2019-10-13 DIAGNOSIS — I454 Nonspecific intraventricular block: Secondary | ICD-10-CM | POA: Insufficient documentation

## 2019-10-13 DIAGNOSIS — Z8249 Family history of ischemic heart disease and other diseases of the circulatory system: Secondary | ICD-10-CM | POA: Diagnosis not present

## 2019-10-13 DIAGNOSIS — I5022 Chronic systolic (congestive) heart failure: Secondary | ICD-10-CM | POA: Insufficient documentation

## 2019-10-13 DIAGNOSIS — Z87891 Personal history of nicotine dependence: Secondary | ICD-10-CM | POA: Diagnosis not present

## 2019-10-13 DIAGNOSIS — Z8673 Personal history of transient ischemic attack (TIA), and cerebral infarction without residual deficits: Secondary | ICD-10-CM | POA: Diagnosis not present

## 2019-10-13 DIAGNOSIS — I428 Other cardiomyopathies: Secondary | ICD-10-CM | POA: Diagnosis not present

## 2019-10-13 DIAGNOSIS — I7300 Raynaud's syndrome without gangrene: Secondary | ICD-10-CM | POA: Insufficient documentation

## 2019-10-13 DIAGNOSIS — I130 Hypertensive heart and chronic kidney disease with heart failure and stage 1 through stage 4 chronic kidney disease, or unspecified chronic kidney disease: Secondary | ICD-10-CM | POA: Insufficient documentation

## 2019-10-13 DIAGNOSIS — N183 Chronic kidney disease, stage 3 unspecified: Secondary | ICD-10-CM | POA: Diagnosis not present

## 2019-10-13 DIAGNOSIS — Z79899 Other long term (current) drug therapy: Secondary | ICD-10-CM | POA: Insufficient documentation

## 2019-10-13 DIAGNOSIS — I480 Paroxysmal atrial fibrillation: Secondary | ICD-10-CM | POA: Diagnosis not present

## 2019-10-13 DIAGNOSIS — Z6835 Body mass index (BMI) 35.0-35.9, adult: Secondary | ICD-10-CM | POA: Insufficient documentation

## 2019-10-13 DIAGNOSIS — I4819 Other persistent atrial fibrillation: Secondary | ICD-10-CM | POA: Diagnosis not present

## 2019-10-13 DIAGNOSIS — E785 Hyperlipidemia, unspecified: Secondary | ICD-10-CM | POA: Diagnosis not present

## 2019-10-13 DIAGNOSIS — Z888 Allergy status to other drugs, medicaments and biological substances status: Secondary | ICD-10-CM | POA: Insufficient documentation

## 2019-10-13 DIAGNOSIS — Z7901 Long term (current) use of anticoagulants: Secondary | ICD-10-CM | POA: Diagnosis not present

## 2019-10-13 DIAGNOSIS — N184 Chronic kidney disease, stage 4 (severe): Secondary | ICD-10-CM | POA: Diagnosis not present

## 2019-10-13 DIAGNOSIS — M1990 Unspecified osteoarthritis, unspecified site: Secondary | ICD-10-CM | POA: Diagnosis not present

## 2019-10-13 DIAGNOSIS — I5043 Acute on chronic combined systolic (congestive) and diastolic (congestive) heart failure: Secondary | ICD-10-CM | POA: Diagnosis not present

## 2019-10-13 HISTORY — PX: ATRIAL FIBRILLATION ABLATION: EP1191

## 2019-10-13 LAB — CBC
HCT: 38.7 % (ref 36.0–46.0)
Hemoglobin: 12.2 g/dL (ref 12.0–15.0)
MCH: 29.9 pg (ref 26.0–34.0)
MCHC: 31.5 g/dL (ref 30.0–36.0)
MCV: 94.9 fL (ref 80.0–100.0)
Platelets: 173 10*3/uL (ref 150–400)
RBC: 4.08 MIL/uL (ref 3.87–5.11)
RDW: 13.9 % (ref 11.5–15.5)
WBC: 6.7 10*3/uL (ref 4.0–10.5)
nRBC: 0 % (ref 0.0–0.2)

## 2019-10-13 LAB — POCT ACTIVATED CLOTTING TIME
Activated Clotting Time: 290 seconds
Activated Clotting Time: 307 seconds
Activated Clotting Time: 323 seconds

## 2019-10-13 LAB — BASIC METABOLIC PANEL
Anion gap: 10 (ref 5–15)
BUN: 27 mg/dL — ABNORMAL HIGH (ref 8–23)
CO2: 25 mmol/L (ref 22–32)
Calcium: 8.6 mg/dL — ABNORMAL LOW (ref 8.9–10.3)
Chloride: 103 mmol/L (ref 98–111)
Creatinine, Ser: 1.73 mg/dL — ABNORMAL HIGH (ref 0.44–1.00)
GFR calc Af Amer: 35 mL/min — ABNORMAL LOW (ref >60)
GFR calc non Af Amer: 30 mL/min — ABNORMAL LOW (ref >60)
Glucose, Bld: 131 mg/dL — ABNORMAL HIGH (ref 70–99)
Potassium: 4.4 mmol/L (ref 3.5–5.1)
Sodium: 138 mmol/L (ref 135–145)

## 2019-10-13 SURGERY — ATRIAL FIBRILLATION ABLATION
Anesthesia: General

## 2019-10-13 MED ORDER — DOBUTAMINE IN D5W 4-5 MG/ML-% IV SOLN
INTRAVENOUS | Status: DC | PRN
  Administered 2019-10-13: 20 ug/kg/min via INTRAVENOUS

## 2019-10-13 MED ORDER — LIDOCAINE 2% (20 MG/ML) 5 ML SYRINGE
INTRAMUSCULAR | Status: DC | PRN
  Administered 2019-10-13: 100 mg via INTRAVENOUS

## 2019-10-13 MED ORDER — DOBUTAMINE IN D5W 4-5 MG/ML-% IV SOLN
INTRAVENOUS | Status: AC
  Filled 2019-10-13: qty 250

## 2019-10-13 MED ORDER — FUROSEMIDE 80 MG PO TABS
80.0000 mg | ORAL_TABLET | Freq: Every day | ORAL | Status: DC
  Administered 2019-10-13 – 2019-10-14 (×2): 80 mg via ORAL
  Filled 2019-10-13 (×2): qty 1

## 2019-10-13 MED ORDER — BUPIVACAINE HCL (PF) 0.25 % IJ SOLN
INTRAMUSCULAR | Status: DC | PRN
  Administered 2019-10-13: 30 mL

## 2019-10-13 MED ORDER — SODIUM CHLORIDE 0.9% FLUSH
3.0000 mL | Freq: Two times a day (BID) | INTRAVENOUS | Status: DC
  Administered 2019-10-13: 3 mL via INTRAVENOUS

## 2019-10-13 MED ORDER — ONDANSETRON HCL 4 MG/2ML IJ SOLN
INTRAMUSCULAR | Status: DC | PRN
  Administered 2019-10-13: 4 mg via INTRAVENOUS

## 2019-10-13 MED ORDER — AMIODARONE HCL 200 MG PO TABS
200.0000 mg | ORAL_TABLET | Freq: Every day | ORAL | Status: DC
  Administered 2019-10-13 – 2019-10-14 (×2): 200 mg via ORAL
  Filled 2019-10-13 (×2): qty 1

## 2019-10-13 MED ORDER — ROSUVASTATIN CALCIUM 5 MG PO TABS
10.0000 mg | ORAL_TABLET | Freq: Every day | ORAL | Status: DC
  Administered 2019-10-13: 10 mg via ORAL
  Filled 2019-10-13: qty 2

## 2019-10-13 MED ORDER — MIDAZOLAM HCL 5 MG/5ML IJ SOLN
INTRAMUSCULAR | Status: DC | PRN
  Administered 2019-10-13: 2 mg via INTRAVENOUS

## 2019-10-13 MED ORDER — BUPIVACAINE HCL (PF) 0.25 % IJ SOLN
INTRAMUSCULAR | Status: AC
  Filled 2019-10-13: qty 30

## 2019-10-13 MED ORDER — HEPARIN (PORCINE) IN NACL 1000-0.9 UT/500ML-% IV SOLN
INTRAVENOUS | Status: AC
  Filled 2019-10-13: qty 500

## 2019-10-13 MED ORDER — CHLORHEXIDINE GLUCONATE CLOTH 2 % EX PADS
6.0000 | MEDICATED_PAD | Freq: Every day | CUTANEOUS | Status: DC
  Administered 2019-10-14 (×2): 6 via TOPICAL

## 2019-10-13 MED ORDER — HYDRALAZINE HCL 25 MG PO TABS
25.0000 mg | ORAL_TABLET | Freq: Three times a day (TID) | ORAL | Status: DC
  Administered 2019-10-13: 12.5 mg via ORAL
  Administered 2019-10-13: 25 mg via ORAL
  Administered 2019-10-14: 12.5 mg via ORAL
  Filled 2019-10-13 (×3): qty 1

## 2019-10-13 MED ORDER — VITAMIN D 25 MCG (1000 UNIT) PO TABS
1000.0000 [IU] | ORAL_TABLET | Freq: Every day | ORAL | Status: DC
  Administered 2019-10-13 – 2019-10-14 (×2): 1000 [IU] via ORAL
  Filled 2019-10-13 (×3): qty 1

## 2019-10-13 MED ORDER — RANOLAZINE ER 500 MG PO TB12
500.0000 mg | ORAL_TABLET | Freq: Two times a day (BID) | ORAL | Status: DC
  Administered 2019-10-13 – 2019-10-14 (×2): 500 mg via ORAL
  Filled 2019-10-13 (×2): qty 1

## 2019-10-13 MED ORDER — DEXAMETHASONE SODIUM PHOSPHATE 10 MG/ML IJ SOLN
INTRAMUSCULAR | Status: DC | PRN
  Administered 2019-10-13: 4 mg via INTRAVENOUS

## 2019-10-13 MED ORDER — HEPARIN SODIUM (PORCINE) 1000 UNIT/ML IJ SOLN
INTRAMUSCULAR | Status: DC | PRN
  Administered 2019-10-13: 1000 [IU] via INTRAVENOUS

## 2019-10-13 MED ORDER — ONDANSETRON HCL 4 MG/2ML IJ SOLN: 4.0000 mg | Freq: Four times a day (QID) | INTRAMUSCULAR | Status: DC | PRN

## 2019-10-13 MED ORDER — PHENYLEPHRINE HCL-NACL 10-0.9 MG/250ML-% IV SOLN
INTRAVENOUS | Status: DC | PRN
  Administered 2019-10-13: 50 ug/min via INTRAVENOUS

## 2019-10-13 MED ORDER — HEPARIN SODIUM (PORCINE) 1000 UNIT/ML IJ SOLN
INTRAMUSCULAR | Status: DC | PRN
  Administered 2019-10-13: 3000 [IU] via INTRAVENOUS
  Administered 2019-10-13: 14000 [IU] via INTRAVENOUS
  Administered 2019-10-13: 3000 [IU] via INTRAVENOUS

## 2019-10-13 MED ORDER — METOPROLOL TARTRATE 100 MG PO TABS: 100.0000 mg | ORAL_TABLET | Freq: Once | ORAL | Status: DC

## 2019-10-13 MED ORDER — EPHEDRINE SULFATE 50 MG/ML IJ SOLN
INTRAMUSCULAR | Status: DC | PRN
  Administered 2019-10-13 (×2): 5 mg via INTRAVENOUS

## 2019-10-13 MED ORDER — FENTANYL CITRATE (PF) 250 MCG/5ML IJ SOLN
INTRAMUSCULAR | Status: DC | PRN
  Administered 2019-10-13 (×2): 50 ug via INTRAVENOUS

## 2019-10-13 MED ORDER — SODIUM CHLORIDE 0.9 % IV SOLN
INTRAVENOUS | Status: DC
  Administered 2019-10-13 (×2): via INTRAVENOUS

## 2019-10-13 MED ORDER — PROTAMINE SULFATE 10 MG/ML IV SOLN
INTRAVENOUS | Status: DC | PRN
  Administered 2019-10-13: 50 mg via INTRAVENOUS

## 2019-10-13 MED ORDER — ROCURONIUM BROMIDE 10 MG/ML (PF) SYRINGE
PREFILLED_SYRINGE | INTRAVENOUS | Status: DC | PRN
  Administered 2019-10-13: 50 mg via INTRAVENOUS

## 2019-10-13 MED ORDER — APIXABAN 5 MG PO TABS
5.0000 mg | ORAL_TABLET | Freq: Two times a day (BID) | ORAL | Status: DC
  Administered 2019-10-13 – 2019-10-14 (×2): 5 mg via ORAL
  Filled 2019-10-13 (×2): qty 1

## 2019-10-13 MED ORDER — SODIUM CHLORIDE 0.9 % IV SOLN: 250.0000 mL | INTRAVENOUS | Status: DC | PRN

## 2019-10-13 MED ORDER — PROPOFOL 10 MG/ML IV BOLUS
INTRAVENOUS | Status: DC | PRN
  Administered 2019-10-13: 120 mg via INTRAVENOUS

## 2019-10-13 MED ORDER — SODIUM CHLORIDE 0.9% FLUSH: 3.0000 mL | INTRAVENOUS | Status: DC | PRN

## 2019-10-13 MED ORDER — POTASSIUM CHLORIDE CRYS ER 20 MEQ PO TBCR
20.0000 meq | EXTENDED_RELEASE_TABLET | Freq: Every day | ORAL | Status: DC
  Administered 2019-10-13 – 2019-10-14 (×2): 20 meq via ORAL
  Filled 2019-10-13 (×2): qty 1

## 2019-10-13 MED ORDER — ACETAMINOPHEN 325 MG PO TABS: 650.0000 mg | ORAL_TABLET | ORAL | Status: DC | PRN

## 2019-10-13 MED ORDER — ISOSORBIDE MONONITRATE ER 30 MG PO TB24
15.0000 mg | ORAL_TABLET | Freq: Every day | ORAL | Status: DC
  Administered 2019-10-13 – 2019-10-14 (×2): 15 mg via ORAL
  Filled 2019-10-13 (×2): qty 1

## 2019-10-13 MED ORDER — HEPARIN (PORCINE) IN NACL 1000-0.9 UT/500ML-% IV SOLN
INTRAVENOUS | Status: DC | PRN
  Administered 2019-10-13 (×5): 500 mL

## 2019-10-13 MED ORDER — HEPARIN SODIUM (PORCINE) 1000 UNIT/ML IJ SOLN
INTRAMUSCULAR | Status: AC
  Filled 2019-10-13: qty 1

## 2019-10-13 SURGICAL SUPPLY — 22 items
BLANKET WARM UNDERBOD FULL ACC (MISCELLANEOUS) ×2 IMPLANT
CATH MAPPNG PENTARAY F 2-6-2MM (CATHETERS) ×1 IMPLANT
CATH SMTCH THERMOCOOL SF DF (CATHETERS) ×2 IMPLANT
CATH SOUNDSTAR ECO REPROCESSED (CATHETERS) ×2 IMPLANT
CATH WEBSTER BI DIR CS D-F CRV (CATHETERS) ×2 IMPLANT
COVER SWIFTLINK CONNECTOR (BAG) ×2 IMPLANT
DEVICE CLOSURE PERCLS PRGLD 6F (VASCULAR PRODUCTS) ×4 IMPLANT
PACK EP LATEX FREE (CUSTOM PROCEDURE TRAY) ×1
PACK EP LF (CUSTOM PROCEDURE TRAY) ×1 IMPLANT
PAD PRO RADIOLUCENT 2001M-C (PAD) ×2 IMPLANT
PATCH CARTO3 (PAD) ×2 IMPLANT
PENTARAY F 2-6-2MM (CATHETERS) ×2
PERCLOSE PROGLIDE 6F (VASCULAR PRODUCTS) ×8
SHEATH AVANTI 11F 11CM (SHEATH) ×2 IMPLANT
SHEATH BAYLIS SUREFLEX  M 8.5 (SHEATH) ×1
SHEATH BAYLIS SUREFLEX M 8.5 (SHEATH) ×1 IMPLANT
SHEATH BAYLIS TRANSSEPTAL 98CM (NEEDLE) ×2 IMPLANT
SHEATH CARTO VIZIGO SM CVD (SHEATH) ×2 IMPLANT
SHEATH PINNACLE 7F 10CM (SHEATH) ×2 IMPLANT
SHEATH PINNACLE 8F 10CM (SHEATH) ×4 IMPLANT
SHEATH PROBE COVER 6X72 (BAG) ×2 IMPLANT
TUBING SMART ABLATE COOLFLOW (TUBING) ×2 IMPLANT

## 2019-10-13 NOTE — Transfer of Care (Signed)
Immediate Anesthesia Transfer of Care Note  Patient: Patricia Wagner  Procedure(s) Performed: ATRIAL FIBRILLATION ABLATION (N/A )  Patient Location: Cath Lab  Anesthesia Type:General  Level of Consciousness: awake, alert  and oriented  Airway & Oxygen Therapy: Patient Spontanous Breathing and Patient connected to nasal cannula oxygen  Post-op Assessment: Report given to RN, Post -op Vital signs reviewed and stable and Patient moving all extremities  Post vital signs: Reviewed and stable  Last Vitals:  Vitals Value Taken Time  BP 101/51 10/13/19 1411  Temp    Pulse 58 10/13/19 1415  Resp 17 10/13/19 1415  SpO2 99 % 10/13/19 1415  Vitals shown include unvalidated device data.  Last Pain:  Vitals:   10/13/19 0926  TempSrc:   PainSc: 0-No pain      Patients Stated Pain Goal: 2 (03/70/48 8891)  Complications: No apparent anesthesia complications

## 2019-10-13 NOTE — Anesthesia Postprocedure Evaluation (Signed)
Anesthesia Post Note  Patient: Patricia Wagner  Procedure(s) Performed: ATRIAL FIBRILLATION ABLATION (N/A )     Patient location during evaluation: PACU Anesthesia Type: General Level of consciousness: awake and alert Pain management: pain level controlled Vital Signs Assessment: post-procedure vital signs reviewed and stable Respiratory status: spontaneous breathing, nonlabored ventilation, respiratory function stable and patient connected to nasal cannula oxygen Cardiovascular status: blood pressure returned to baseline and stable Postop Assessment: no apparent nausea or vomiting Anesthetic complications: no    Last Vitals:  Vitals:   10/13/19 1525 10/13/19 1530  BP: (!) 133/57 (!) 118/56  Pulse: (!) 55 (!) 55  Resp: 15 16  Temp:  36.6 C  SpO2: 99% 97%    Last Pain:  Vitals:   10/13/19 0926  TempSrc:   PainSc: 0-No pain                 Barnet Glasgow

## 2019-10-13 NOTE — Anesthesia Preprocedure Evaluation (Addendum)
Anesthesia Evaluation  Patient identified by MRN, date of birth, ID band Patient awake    Reviewed: Allergy & Precautions, Patient's Chart, lab work & pertinent test results  Airway Mallampati: II  TM Distance: >3 FB Neck ROM: Full    Dental no notable dental hx. (+) Teeth Intact   Pulmonary former smoker,    Pulmonary exam normal breath sounds clear to auscultation       Cardiovascular hypertension, Pt. on home beta blockers and Pt. on medications + CAD and +CHF  Normal cardiovascular exam+ dysrhythmias Atrial Fibrillation  Rhythm:Regular Rate:Normal  07/19/19 Echo 1. The left ventricle has mildly reduced systolic function, with an ejection fraction of 45-50%. The cavity size was normal. There is severe concentric left ventricular hypertrophy. Left ventricular diastolic Doppler parameters are consistent with  impaired relaxation.   Neuro/Psych CVA negative psych ROS   GI/Hepatic negative GI ROS, Neg liver ROS,   Endo/Other  negative endocrine ROS  Renal/GU Renal InsufficiencyRenal diseaseK+ 3.8 Cr 2.24     Musculoskeletal   Abdominal   Peds  Hematology Hgb 13.3 Plt 200   Anesthesia Other Findings   Reproductive/Obstetrics                            Anesthesia Physical Anesthesia Plan  ASA: IV  Anesthesia Plan: General   Post-op Pain Management:    Induction: Intravenous  PONV Risk Score and Plan: 3 and Treatment may vary due to age or medical condition, Ondansetron and Dexamethasone  Airway Management Planned: Oral ETT  Additional Equipment:   Intra-op Plan:   Post-operative Plan: Extubation in OR  Informed Consent: I have reviewed the patients History and Physical, chart, labs and discussed the procedure including the risks, benefits and alternatives for the proposed anesthesia with the patient or authorized representative who has indicated his/her understanding and  acceptance.     Dental advisory given  Plan Discussed with:   Anesthesia Plan Comments:         Anesthesia Quick Evaluation

## 2019-10-13 NOTE — H&P (Signed)
Patricia Wagner has presented today for surgery, with the diagnosis of atrial fibrillation.  The various methods of treatment have been discussed with the patient and family. After consideration of risks, benefits and other options for treatment, the patient has consented to  Procedure(s): Catheter ablation as a surgical intervention .  Risks include but not limited to bleeding, tamponade, heart block, stroke, damage to surrounding organs, among others. The patient's history has been reviewed, patient examined, no change in status, stable for surgery.  I have reviewed the patient's chart and labs.  Questions were answered to the patient's satisfaction.    Will Curt Bears, MD 10/13/2019 10:21 AM

## 2019-10-13 NOTE — Anesthesia Procedure Notes (Signed)
Procedure Name: Intubation Date/Time: 10/13/2019 11:13 AM Performed by: Amadeo Garnet, CRNA Pre-anesthesia Checklist: Patient identified, Emergency Drugs available, Suction available, Patient being monitored and Timeout performed Patient Re-evaluated:Patient Re-evaluated prior to induction Oxygen Delivery Method: Circle system utilized Preoxygenation: Pre-oxygenation with 100% oxygen Induction Type: IV induction Ventilation: Mask ventilation without difficulty and Oral airway inserted - appropriate to patient size Laryngoscope Size: Mac and 3 Grade View: Grade I Tube type: Oral Tube size: 7.0 mm Number of attempts: 1 Airway Equipment and Method: Stylet Placement Confirmation: ETT inserted through vocal cords under direct vision,  positive ETCO2 and breath sounds checked- equal and bilateral Secured at: 22 cm Tube secured with: Tape Dental Injury: Teeth and Oropharynx as per pre-operative assessment

## 2019-10-13 NOTE — Plan of Care (Signed)
  Problem: Clinical Measurements: Goal: Ability to maintain clinical measurements within normal limits will improve Outcome: Progressing Goal: Will remain free from infection Outcome: Progressing Goal: Diagnostic test results will improve Outcome: Progressing Goal: Respiratory complications will improve Outcome: Progressing Goal: Cardiovascular complication will be avoided Outcome: Progressing   Problem: Activity: Goal: Risk for activity intolerance will decrease Outcome: Progressing   Problem: Pain Managment: Goal: General experience of comfort will improve Outcome: Progressing   Problem: Safety: Goal: Ability to remain free from injury will improve Outcome: Progressing   

## 2019-10-13 NOTE — Discharge Summary (Addendum)
ELECTROPHYSIOLOGY PROCEDURE DISCHARGE SUMMARY    Patient ID: Patricia Wagner,  MRN: [id number], DOB/AGE: 03/07/1951 68 y.o.  Admit date: 10/13/2019 Discharge date: 10/14/19   Primary Care Physician: Biagio Borg, MD  Primary Cardiologist: Glori Bickers, MD  Electrophysiologist: Dr. Curt Bears  Primary Discharge Diagnosis:  Persistent Atrial Fibrillation  Secondary Discharge Diagnosis:  NICM HTN HLD  Procedures This Admission:  1.  Electrophysiology study and radiofrequency catheter ablation of Atrial Fibrillation on 10/13/2019 by Dr. Curt Bears.  This study demonstrated; 1. Atrial fibrillation upon presentation.   2. Successful electrical isolation and anatomical encircling of all four pulmonary veins with radiofrequency current.  A WACA approach was used 3. Additional left atrial ablation was performed with a standard box lesion created along the posterior wall of the left atrium 4. Atrial fibrillation successfully cardioverted to sinus rhythm. 5. No early apparent complications.  Brief HPI: Patricia Wagner is a 68 y.o. female with a history of persistent Atrial Fibrillation.  They have failed medical therapy with amiodarone. Risks, benefits, and alternatives to catheter ablation of Atrial Fibrillation were reviewed with the patient who wished to proceed.  No TEE performed as patient has been on appropriately dosed Eliquis without interruption. Most recent Echo 06/2019 showed LVEF 45-50%.  Hospital Course:  The patient was admitted and underwent EPS/RFCA of Atrial Fibrillation with details as outlined above.  They were monitored on telemetry overnight which demonstrated NSR.  Groin was without complication on the day of discharge.  The patient was examined and considered to be stable for discharge.  Wound care and restrictions were reviewed with the patient.  The patient  be seen back by Adline Peals, PA in 4 weeks and Dr. Curt Bears in 12 weeks for post ablation follow up.   This  patients CHA2DS2-VASc Score and unadjusted Ischemic Stroke Rate (% per year) is at least 11.2 % stroke rate/year from a score of 7 Above score calculated as 1 point each if present [CHF, HTN, DM, Vascular=MI/PAD/Aortic Plaque, Age if 65-74, or Female] Above score calculated as 2 points each if present [Age > 75, or Stroke/TIA/TE]   Physical Exam: Vitals:   10/13/19 2047 10/14/19 0130 10/14/19 0440 10/14/19 0457  BP: (!) 99/51 114/72 131/67   Pulse: 70 69 (!) 59   Resp: 20 20 20    Temp: 100.3 F (37.9 C) 98.9 F (37.2 C) 98.7 F (37.1 C)   TempSrc: Oral Oral Oral   SpO2: 91% 97% 90%   Weight:    99.3 kg  Height:        GEN- The patient is well appearing, alert and oriented x 3 today.   HEENT: normocephalic, atraumatic; sclera clear, conjunctiva pink; hearing intact; oropharynx clear; neck supple  Lungs- Clear to ausculation bilaterally, normal work of breathing.  No wheezes, rales, rhonchi Heart- Regular rate and rhythm, no murmurs, rubs or gallops  GI- soft, non-tender, non-distended, bowel sounds present  Extremities- no clubbing, cyanosis, or edema; DP/PT/radial pulses 2+ bilaterally, groin without hematoma/bruit MS- no significant deformity or atrophy Skin- warm and dry, no rash or lesion Psych- euthymic mood, full affect Neuro- strength and sensation are intact   Labs:   Lab Results  Component Value Date   WBC 6.7 10/13/2019   HGB 12.2 10/13/2019   HCT 38.7 10/13/2019   MCV 94.9 10/13/2019   PLT 173 10/13/2019    Recent Labs  Lab 10/13/19 1708  NA 138  K 4.4  CL 103  CO2 25  BUN 27*  CREATININE 1.73*  CALCIUM 8.6*  GLUCOSE 131*     Discharge Medications:  Allergies as of 10/14/2019      Reactions   Ace Inhibitors Palpitations      Medication List    STOP taking these medications   azithromycin 250 MG tablet Commonly known as: Zithromax Z-Pak     TAKE these medications   acetaminophen 325 MG tablet Commonly known as: TYLENOL Take 2 tablets  (650 mg total) by mouth every 4 (four) hours as needed for headache or mild pain.   amiodarone 200 MG tablet Commonly known as: PACERONE Take 1 tablet (200 mg total) by mouth daily.   apixaban 5 MG Tabs tablet Commonly known as: ELIQUIS Take 1 tablet (5 mg total) by mouth 2 (two) times daily.   furosemide 80 MG tablet Commonly known as: LASIX Take 1 tablet (80 mg total) by mouth 2 (two) times daily. What changed: when to take this   hydrALAZINE 25 MG tablet Commonly known as: APRESOLINE Take 0.5 tablets (12.5 mg total) by mouth 3 (three) times daily. What changed: how much to take   isosorbide mononitrate 30 MG 24 hr tablet Commonly known as: IMDUR TAKE HALF A TABLET BY MOUTH EVERY DAY What changed:   how much to take  how to take this  when to take this  additional instructions   metoprolol tartrate 100 MG tablet Commonly known as: LOPRESSOR Take 1 tablet (100 mg total) by mouth once for 1 dose. Two hours prior to CT testing   potassium chloride SA 20 MEQ tablet Commonly known as: KLOR-CON Take 2 tablets (40 mEq total) by mouth daily. What changed: how much to take   ranolazine 500 MG 12 hr tablet Commonly known as: RANEXA Take 1 tablet (500 mg total) by mouth 2 (two) times daily.   rosuvastatin 10 MG tablet Commonly known as: CRESTOR Take 1 tablet (10 mg total) by mouth daily. What changed: when to take this   Vitamin D (Ergocalciferol) 1.25 MG (50000 UT) Caps capsule Commonly known as: DRISDOL Take 1 capsule (50,000 Units total) by mouth every 7 (seven) days.   Vitamin D3 25 MCG (1000 UT) Caps Take 1,000 Units by mouth daily.   Voltaren 1 % Gel Generic drug: diclofenac Sodium Apply 1 application topically 2 (two) times daily as needed (Knee pain).       Disposition:   Follow-up Information    [name], MD Follow up on 01/17/2020.   Specialty: Cardiology Why: at 145 pm for 3 month post ablation follow up Contact information: [address] [place] [address] [phone]        [facility] Follow up on 11/10/2019.   Specialty: Cardiology Why: at 130 pm for 1 month post ablation follow up Contact information: [address] [id number] [place] [address] [phone]          Duration of Discharge Encounter: Greater than 30 minutes including physician time.  Signed, Shirley Friar, PA-C  10/14/2019 7:55 AM   I have seen and examined this patient with Oda Kilts.  Agree with above, note added to reflect my findings.  On exam, RRR, no murmurs, lungs clear.  Patient had AF ablation for persitent atrial fibrillation. Tolerated the procedure well without complaint this morning. Plan for discharge today with follow up in EP clinic.   M.  MD 10/14/2019 9:00 AM

## 2019-10-14 ENCOUNTER — Encounter (HOSPITAL_COMMUNITY): Payer: Self-pay | Admitting: Cardiology

## 2019-10-14 DIAGNOSIS — I130 Hypertensive heart and chronic kidney disease with heart failure and stage 1 through stage 4 chronic kidney disease, or unspecified chronic kidney disease: Secondary | ICD-10-CM | POA: Diagnosis not present

## 2019-10-14 DIAGNOSIS — N179 Acute kidney failure, unspecified: Secondary | ICD-10-CM | POA: Diagnosis not present

## 2019-10-14 DIAGNOSIS — Z87891 Personal history of nicotine dependence: Secondary | ICD-10-CM | POA: Diagnosis not present

## 2019-10-14 DIAGNOSIS — E785 Hyperlipidemia, unspecified: Secondary | ICD-10-CM | POA: Diagnosis not present

## 2019-10-14 DIAGNOSIS — I4819 Other persistent atrial fibrillation: Secondary | ICD-10-CM | POA: Diagnosis not present

## 2019-10-14 DIAGNOSIS — I5022 Chronic systolic (congestive) heart failure: Secondary | ICD-10-CM | POA: Diagnosis not present

## 2019-10-14 DIAGNOSIS — N183 Chronic kidney disease, stage 3 unspecified: Secondary | ICD-10-CM | POA: Diagnosis not present

## 2019-10-14 DIAGNOSIS — I454 Nonspecific intraventricular block: Secondary | ICD-10-CM | POA: Diagnosis not present

## 2019-10-14 MED ORDER — MENTHOL 3 MG MT LOZG
1.0000 | LOZENGE | OROMUCOSAL | Status: DC | PRN
  Filled 2019-10-14: qty 9

## 2019-10-14 MED ORDER — ACETAMINOPHEN 325 MG PO TABS: 650.0000 mg | ORAL_TABLET | ORAL | Status: DC | PRN

## 2019-10-14 MED ORDER — PANTOPRAZOLE SODIUM 40 MG PO TBEC: 40.0000 mg | DELAYED_RELEASE_TABLET | Freq: Every day | ORAL | 0 refills | Status: DC

## 2019-10-14 NOTE — Discharge Instructions (Signed)
Cardiac Ablation, Care After °This sheet gives you information about how to care for yourself after your procedure. Your health care provider may also give you more specific instructions. If you have problems or questions, contact your health care provider. °What can I expect after the procedure? °After the procedure, it is common to have: °· Bruising around your puncture site. °· Tenderness around your puncture site. °· Skipped heartbeats. °· Tiredness (fatigue). ° °Follow these instructions at home: °Puncture site care  °· Follow instructions from your health care provider about how to take care of your puncture site. Make sure you: °? If present, leave stitches (sutures), skin glue, or adhesive strips in place. These skin closures may need to stay in place for up to 2 weeks. If adhesive strip edges start to loosen and curl up, you may trim the loose edges. Do not remove adhesive strips completely unless your health care provider tells you to do that. °· Check your puncture site every day for signs of infection. Check for: °? Redness, swelling, or pain. °? Fluid or blood. If your puncture site starts to bleed, lie down on your back, apply firm pressure to the area, and contact your health care provider. °? Warmth. °? Pus or a bad smell. °Driving °· Do not drive for at least 4 days after your procedure or however long your health care provider recommends. °· Do not drive or use heavy machinery while taking prescription pain medicine. °· Do not drive for 24 hours if you were given a medicine to help you relax (sedative) during your procedure. °Activity °· Avoid activities that take a lot of effort for at least 7 days after your procedure. °· Do not lift anything that is heavier than 5 lb (4.5 kg) for one week.  °· No sexual activity for 1 week.  °· Return to your normal activities as told by your health care provider. Ask your health care provider what activities are safe for you. °General instructions °· Take  over-the-counter and prescription medicines only as told by your health care provider. °· Do not use any products that contain nicotine or tobacco, such as cigarettes and e-cigarettes. If you need help quitting, ask your health care provider. °· You may shower after 24 hours, but Do not take baths, swim, or use a hot tub for 1 week.  °· Do not drink alcohol for 24 hours after your procedure. °· Keep all follow-up visits as told by your health care provider. This is important. °Contact a health care provider if: °· You have redness, mild swelling, or pain around your puncture site. °· You have fluid or blood coming from your puncture site that stops after applying firm pressure to the area. °· Your puncture site feels warm to the touch. °· You have pus or a bad smell coming from your puncture site. °· You have a fever. °· You have chest pain or discomfort that spreads to your neck, jaw, or arm. °· You are sweating a lot. °· You feel nauseous. °· You have a fast or irregular heartbeat. °· You have shortness of breath. °· You are dizzy or light-headed and feel the need to lie down. °· You have pain or numbness in the arm or leg closest to your puncture site. °Get help right away if: °· Your puncture site suddenly swells. °· Your puncture site is bleeding and the bleeding does not stop after applying firm pressure to the area. °These symptoms may represent a serious problem that   is an emergency. Do not wait to see if the symptoms will go away. Get medical help right away. Call your local emergency services (911 in the [place] Do not drive yourself to the hospital. Summary  After the procedure, it is normal to have bruising and tenderness at the puncture site in your groin, neck, or forearm.  Check your puncture site every day for signs of infection.  Get help right away if your puncture site is bleeding and the bleeding does not stop after applying firm pressure to the area. This is a medical emergency. This  information is not intended to replace advice given to you by your health care provider. Make sure you discuss any questions you have with your health care provider.   Information on my medicine - ELIQUIS (apixaban)  This medication education was reviewed with me or my healthcare representative as part of my discharge preparation.   Why was Eliquis prescribed for you? Eliquis was prescribed for you to reduce the risk of a blood clot forming that can cause a stroke if you have a medical condition called atrial fibrillation (a type of irregular heartbeat).  What do You need to know about Eliquis ? Take your Eliquis TWICE DAILY - one tablet in the morning and one tablet in the evening with or without food. If you have difficulty swallowing the tablet whole please discuss with your pharmacist how to take the medication safely.  Take Eliquis exactly as prescribed by your doctor and DO NOT stop taking Eliquis without talking to the doctor who prescribed the medication.  Stopping may increase your risk of developing a stroke.  Refill your prescription before you run out.  After discharge, you should have regular check-up appointments with your healthcare provider that is prescribing your Eliquis.  In the future your dose may need to be changed if your kidney function or weight changes by a significant amount or as you get older.  What do you do if you miss a dose? If you miss a dose, take it as soon as you remember on the same day and resume taking twice daily.  Do not take more than one dose of ELIQUIS at the same time to make up a missed dose.  Important Safety Information A possible side effect of Eliquis is bleeding. You should call your healthcare provider right away if you experience any of the following: ? Bleeding from an injury or your nose that does not stop. ? Unusual colored urine (red or dark brown) or unusual colored stools (red or black). ? Unusual bruising for unknown  reasons. ? A serious fall or if you hit your head (even if there is no bleeding).  Some medicines may interact with Eliquis and might increase your risk of bleeding or clotting while on Eliquis. To help avoid this, consult your healthcare provider or pharmacist prior to using any new prescription or non-prescription medications, including herbals, vitamins, non-steroidal anti-inflammatory drugs (NSAIDs) and supplements.  This website has more information on Eliquis (apixaban): http://www.eliquis.com/eliquis/home

## 2019-10-14 NOTE — Plan of Care (Signed)

## 2019-10-14 NOTE — TOC Progression Note (Signed)
Transition of Care [facility]) - Progression Note    Patient Details  Name: Patricia Wagner MRN: [id number] Date of Birth: May 26, 1951  [facility]) CM/SW Contact  Zenon Mayo, RN Phone Number: 10/14/2019, 12:34 PM  Clinical Narrative:    Patient for dc today, NCM spoke to her about her eliquis, she is in the doughnut hole right now, and she has already used the 30 day free coupon.  NCM informed patient unfortunately there is no coupon that she can use, but starting in Jan her insurance will start over where she will not be in the doughnut hole.        Expected Discharge Plan and Services           Expected Discharge Date: 10/14/19                                     Social Determinants of Health (SDOH) Interventions    Readmission Risk Interventions No flowsheet data found.

## 2019-10-17 ENCOUNTER — Ambulatory Visit: Payer: Medicare HMO | Admitting: Internal Medicine

## 2019-10-25 ENCOUNTER — Inpatient Hospital Stay: Payer: Medicare HMO | Admitting: Internal Medicine

## 2019-10-25 DIAGNOSIS — R338 Other retention of urine: Secondary | ICD-10-CM | POA: Diagnosis not present

## 2019-10-31 ENCOUNTER — Telehealth: Payer: Self-pay | Admitting: Cardiology

## 2019-10-31 NOTE — Telephone Encounter (Signed)
Pt takes Eliquis for afib with CHADS2VASc score of 7 (age, sex, CHF, HTN, CAD, CVA), also prediabetic. Pt underwent ablation 10/13/19 and will need to continue on uninterrupted anticoagulation for 3 months afterwards. The earliest she can stop her Eliquis is February 19th and at that time she is still high risk given history of stroke. SCr 1.73, CrCl is 64mL/min using actual body weight, 69mL/min using adjusted body weight. If > 1 day anticoagulation hold is required at that time, will need physician clearance.

## 2019-10-31 NOTE — Telephone Encounter (Signed)
..  ° °  [facility] Pre-operative Risk Assessment    Request for surgical clearance:  1. What type of surgery is being performed? CYSTOSTOMY WITH SUPRAPUBIC TUBE PLACEMENT  2. When is this surgery scheduled? TBD  3. What type of clearance is required (medical clearance vs. Pharmacy clearance to hold med vs. Both)? BOTH  4. Are there any medications that need to be held prior to surgery and how long? ELIQUIS 3 DAYS  5. Practice name and name of physician performing surgery? [facility]- DR WINTER  6. What is your office phone number [phone] CONN[place]   7.   What is your office fax number [phone]  8.   Anesthesia type (None, local, MAC, general) ? [place] 10/31/2019, 12:19 PM  _________________________________________________________________   (provider comments below)

## 2019-10-31 NOTE — Telephone Encounter (Signed)
Pharmacy can you comment on holding Eliquis in this patient, then I'll contact her.  Kerin Ransom PA-C 10/31/2019 3:43 PM

## 2019-11-01 NOTE — Telephone Encounter (Signed)
Left message to call back  Kerin Ransom PA-C 11/01/2019 9:53 AM

## 2019-11-04 NOTE — Telephone Encounter (Signed)
I called [facility] to discuss holding anticoagulation and the urgency of the procedure. Left a voicemail.

## 2019-11-07 ENCOUNTER — Other Ambulatory Visit: Payer: Self-pay | Admitting: Urology

## 2019-11-09 ENCOUNTER — Other Ambulatory Visit (HOSPITAL_COMMUNITY): Payer: Self-pay | Admitting: Cardiology

## 2019-11-10 ENCOUNTER — Ambulatory Visit (HOSPITAL_COMMUNITY): Payer: Medicare HMO | Admitting: Physician Assistant

## 2019-11-10 NOTE — Telephone Encounter (Signed)
Marlowe Kays from [facility] is returning phone call about clearance.

## 2019-11-10 NOTE — Telephone Encounter (Signed)
Spoke with Marlowe Kays with [facility] and given the need for uninterrupted anticoagulation, the patients procedure will be moved out past her next follow up and 3 month AC recs per pharmacy (see phone notes). To be seen by Dr. Curt Bears on 01/17/2020. Will have pre-op call back team add pre-op evaluation to appointment.   Kathyrn Drown NP-C

## 2019-11-10 NOTE — Telephone Encounter (Signed)
appt notes have been noted need pre op clearance. I will forward notes to Dr. Curt Bears and his nurse Venida Jarvis. I will also send notes to Surgeon Dr. Lovena Neighbours as Juluis Rainier. I will remove from the pre op call back pool.

## 2019-11-10 NOTE — Telephone Encounter (Signed)
Patient would be intermediate risk for an intermediate risk procedure.  Only anticoagulated and Mccrae Speciale need 3 months of uninterrupted anticoagulation which would be February 19.  After that time, she would be able to come off of her anticoagulation.  She has follow-up in EP clinic on 01/16/2019, at which point anticoagulation can be discussed further.  No further cardiac testing is needed.

## 2019-11-13 DIAGNOSIS — M25561 Pain in right knee: Secondary | ICD-10-CM | POA: Diagnosis not present

## 2019-11-14 ENCOUNTER — Encounter (HOSPITAL_COMMUNITY): Payer: Self-pay | Admitting: Physician Assistant

## 2019-11-14 ENCOUNTER — Other Ambulatory Visit: Payer: Self-pay

## 2019-11-14 ENCOUNTER — Ambulatory Visit (HOSPITAL_COMMUNITY)
Admission: RE | Admit: 2019-11-14 | Discharge: 2019-11-14 | Disposition: A | Discharge status: Home / Self Care | Payer: Medicare HMO | Source: Ambulatory Visit | Attending: Physician Assistant | Admitting: Physician Assistant

## 2019-11-14 VITALS — BP 124/76 | HR 72 | Ht 65.0 in | Wt 221.0 lb

## 2019-11-14 DIAGNOSIS — Z7901 Long term (current) use of anticoagulants: Secondary | ICD-10-CM | POA: Diagnosis not present

## 2019-11-14 DIAGNOSIS — I2510 Atherosclerotic heart disease of native coronary artery without angina pectoris: Secondary | ICD-10-CM | POA: Insufficient documentation

## 2019-11-14 DIAGNOSIS — D6869 Other thrombophilia: Secondary | ICD-10-CM | POA: Diagnosis not present

## 2019-11-14 DIAGNOSIS — I428 Other cardiomyopathies: Secondary | ICD-10-CM | POA: Diagnosis not present

## 2019-11-14 DIAGNOSIS — Z8673 Personal history of transient ischemic attack (TIA), and cerebral infarction without residual deficits: Secondary | ICD-10-CM | POA: Diagnosis not present

## 2019-11-14 DIAGNOSIS — Z8249 Family history of ischemic heart disease and other diseases of the circulatory system: Secondary | ICD-10-CM | POA: Diagnosis not present

## 2019-11-14 DIAGNOSIS — Z79899 Other long term (current) drug therapy: Secondary | ICD-10-CM | POA: Diagnosis not present

## 2019-11-14 DIAGNOSIS — I5022 Chronic systolic (congestive) heart failure: Secondary | ICD-10-CM | POA: Insufficient documentation

## 2019-11-14 DIAGNOSIS — Z87891 Personal history of nicotine dependence: Secondary | ICD-10-CM | POA: Insufficient documentation

## 2019-11-14 DIAGNOSIS — I130 Hypertensive heart and chronic kidney disease with heart failure and stage 1 through stage 4 chronic kidney disease, or unspecified chronic kidney disease: Secondary | ICD-10-CM | POA: Insufficient documentation

## 2019-11-14 DIAGNOSIS — N183 Chronic kidney disease, stage 3 unspecified: Secondary | ICD-10-CM | POA: Diagnosis not present

## 2019-11-14 DIAGNOSIS — E785 Hyperlipidemia, unspecified: Secondary | ICD-10-CM | POA: Diagnosis not present

## 2019-11-14 DIAGNOSIS — I4819 Other persistent atrial fibrillation: Secondary | ICD-10-CM | POA: Diagnosis not present

## 2019-11-14 NOTE — Progress Notes (Addendum)
Primary Care Physician: Biagio Borg, MD Referring Physician: [facility] f/u Cardiologist: [name] EP: Dr. Bridgett Larsson Patricia Wagner is a 68 y.o. female with a h/o  past medical history significant for persistent atrial fibrillation, non ischemic cardiomyopathy, hyperlipidemia, and hypertension.  Because of cardiomyopathy, it was felt that she would require AAD therapy to maintain SR.  She was concerned about cost for Tikosyn and so Sotalol was initiated. She underwent cardioversion on 11/03/16 with restoration of sinus rhythm.  Her fluid status was improved on discharge at 239 lbs.  F/u in afib clinic 12/14 and is found to be in atrial flutter with RVR at 112 bpm. Pt is unaware of heart beat. Fluid status is stable per pt and is no longer having LLE, PND/orthopnea.  F/u afib clinic 12/20. She is still in afib but rate is controlled. BB was increased on last visit. She does not want another cardioversion at this time. She feels well and would like to return to work. She is having diarrhea which is probably the effects of the mag supplement.   F/u 12/21, she asked to be seen today for feeling poorly, more short of breath, still having diarrhea, she thinks increase of metoprolol caused loose stools and stopping magnesium for several days did not help. She continues in rate controlled afib but her weight is up 2 lbs. She states that she is being complaint with all meds and has not missed any sotalol or eliquis. She asked to return to work on last visit but is having difficulty carrying out her activities due to fatigue, and asked to be placed out of work until she feels better. Decision was made to cardiovert.  She returns to afib clinic 1/8. She had a successful cardioversion 12/26 but presented to the ER 12/28 with left eye pain and left sided H/A.She was also found to be bradycardic. Sotalol was stopped.Marland KitchenMRI ordered and showed acute nonhemorrhagic left superior cerebellar infarct. EF continued to show  EF of 20-25 %. Pt states that she had taken DOAC without interruption but it was decided to switch pt to coumadin and lovenox.Pt states today that she finished lovenox shots on Saturday. She has mild weakness in left hand and leg but is undergoing PT and is improving. She was suppose to be on 1 1/2 tabs of lasix  On d/c but has been taking 40 mg a day and fluid status is stable, down 10-12 lbs. It was decided to rate control her for a while. Currently she is not noticing any palpitations.  F/U afib clinic 5/17-since seeing pt last, she was seen by Dr. Lovena Le during a subsequent hospitalization and amiodarone load was started at 200 mg bid with plans for cardioversion after loading x 4-6 weeks. She had TEE guided cardioversion 5/2 which was unsuccessful. Pt did stop amiodarone on her own for a few weeks prior to cardioversion 2/2 she felt it was making her ankles swell. This mayhave played into her inability to restore SR with latest cardioversion. She is being seen today for consideration if repeat cardioversion should be pursued after longer loading on amiodarone vrs ablation.  Follow up in the AF clinic 11/14/19. Patient is s/p afib ablation with Dr Curt Bears 10/10/19. She reports that she has done well since her procedure. She denies any heart racing or palpitations. She denies CP, swallowing, or groin issues. She is in SR today. She is on Eliquis for a CHADS2VASC score of 7.  Today, she denies symptoms of palpitations,  chest pain,  orthopnea, PND,  dizziness, presyncope, syncope, or neurologic sequela. Chronic fatigue and LLE, exertional dyspnea.The patient is tolerating medications without difficulties and is otherwise without complaint today.   Past Medical History:  Diagnosis Date  . Acute blood loss anemia 03/05/2018  . Acute hypercapnic respiratory failure ([place]) 04/30/2017  . Acute renal failure superimposed on chronic kidney disease ([place]) 10/20/2016  . Arthritis   . Atrial fibrillation ([place])     a. s/p multiple cardioversions; failed tikosyn/sotalol.  . Bradycardia 11/20/2016  . CAD in native artery, s/p cardiac cath with non obstructive CAD 10/24/2016  . Cerebrovascular accident (CVA) due to embolism of left cerebellar artery ([place])   . CHF (congestive heart failure) ([place])   . Chronic anticoagulation 03/15/2018  . Chronic atrial fibrillation ([place]) 03/05/2018  . Chronic systolic (congestive) heart failure ([place]) 03/05/2018  . CKD (chronic kidney disease) stage 3, GFR 30-59 ml/min   . Coagulopathy ([place]) 03/05/2018  . DIVERTICULITIS, HX OF 07/25/2007  . Diverticulosis of colon with hemorrhage 07/22/2007   Qualifier: Diagnosis of  By: [name]    . DIVERTICULOSIS, COLON 07/22/2007  . Dyspnea   . Dysuria 07/21/2017  . Edema, peripheral    a. chronic BLE edema, R>L. Prior trauma from dog attack and accident.  . Essential hypertension 07/22/2007   Qualifier: Diagnosis of  By: [name]    . HLD (hyperlipidemia) 02/03/2008   Qualifier: Diagnosis of  By: [name], [name]   . HYPERLIPIDEMIA 02/03/2008  . Hypersomnia    declines w/u  . Hypertension   . Hypotension (arterial) 04/30/2017  . MENOPAUSAL DISORDER 01/09/2011  . Morbid obesity ([place]) 07/22/2007  . NICM (nonischemic cardiomyopathy) ([place]) 10/24/2016  . PAF (paroxysmal atrial fibrillation) ([place])   . Raynaud's syndrome 07/22/2007  . Stroke ([place]) 2017  . Suspected sleep apnea 05/20/2017  . THYROID NODULE, RIGHT 01/04/2010  . VITAMIN D DEFICIENCY 01/09/2011   Qualifier: Diagnosis of  By: [name], [name]    Past Surgical History:  Procedure Laterality Date  . ATRIAL FIBRILLATION ABLATION N/A 10/13/2019   Procedure: ATRIAL FIBRILLATION ABLATION;  Surgeon: [name], MD;  Location: [place] [facility];  Service: Cardiovascular;  Laterality: N/A;  . CARDIAC CATHETERIZATION N/A 10/23/2016   Procedure: Left Heart Cath and Coronary Angiography;  Surgeon: [name], MD;  Location: [place] [facility];  Service:  Cardiovascular;  Laterality: N/A;  . CARDIOVERSION N/A 10/31/2016   Procedure: CARDIOVERSION;  Surgeon: [name], MD;  Location: [place];  Service: Cardiovascular;  Laterality: N/A;  . CARDIOVERSION N/A 11/03/2016   Procedure: CARDIOVERSION;  Surgeon: [name], MD;  Location: [place];  Service: Cardiovascular;  Laterality: N/A;  . CARDIOVERSION N/A 11/18/2016   Procedure: CARDIOVERSION;  Surgeon: [name], MD;  Location: [place];  Service: Cardiovascular;  Laterality: N/A;  . CARDIOVERSION N/A 03/25/2017   Procedure: CARDIOVERSION;  Surgeon: [name], MD;  Location: [facility] ENDOSCOPY;  Service: Cardiovascular;  Laterality: N/A;  . CARDIOVERSION N/A 05/06/2017   Procedure: CARDIOVERSION;  Surgeon: [name], MD;  Location: [facility] ENDOSCOPY;  Service: Cardiovascular;  Laterality: N/A;  . CARDIOVERSION N/A 05/12/2017   Procedure: CARDIOVERSION;  Surgeon: [name], MD;  Location: [facility] ENDOSCOPY;  Service: Cardiovascular;  Laterality: N/A;  . COLONOSCOPY N/A 12/04/2017   Procedure: COLONOSCOPY;  Surgeon: [name], MD;  Location: [place];  Service: Endoscopy;  Laterality: N/A;  . COLONOSCOPY W/ POLYPECTOMY  02/2011   pan diverticulosis.  tubular  adenoma without dysplasia on 5 mm sigmoid polyp.  Dr Fuller Plan.    Marland Kitchen PARTIAL HYSTERECTOMY     1 OVARY LEFT  . TEE WITHOUT CARDIOVERSION N/A 10/31/2016   Procedure: TRANSESOPHAGEAL ECHOCARDIOGRAM (TEE);  Surgeon: [name], MD;  Location: [facility] ENDOSCOPY;  Service: Cardiovascular;  Laterality: N/A;  . TEE WITHOUT CARDIOVERSION N/A 03/25/2017   Procedure: TRANSESOPHAGEAL ECHOCARDIOGRAM (TEE);  Surgeon: [name], MD;  Location: [facility] ENDOSCOPY;  Service: Cardiovascular;  Laterality: N/A;    Current Outpatient Medications  Medication Sig Dispense Refill  . acetaminophen (TYLENOL) 325 MG tablet Take 2 tablets (650 mg total) by mouth every 4 (four) hours as needed for headache or mild pain.    Marland Kitchen  amiodarone (PACERONE) 200 MG tablet Take 1 tablet (200 mg total) by mouth daily. 90 tablet 3  . apixaban (ELIQUIS) 5 MG TABS tablet Take 1 tablet (5 mg total) by mouth 2 (two) times daily. 180 tablet 3  . Cholecalciferol (VITAMIN D3) 25 MCG (1000 UT) CAPS Take 1,000 Units by mouth daily.    . furosemide (LASIX) 80 MG tablet TAKE 1 TABLET BY MOUTH EVERY DAY 90 tablet 3  . hydrALAZINE (APRESOLINE) 25 MG tablet Take 0.5 tablets (12.5 mg total) by mouth 3 (three) times daily. (Patient taking differently: Take 25 mg by mouth 3 (three) times daily. ) 270 tablet 3  . isosorbide mononitrate (IMDUR) 30 MG 24 hr tablet TAKE HALF A TABLET BY MOUTH EVERY DAY (Patient taking differently: Take 15 mg by mouth daily. ) 45 tablet 1  . metoprolol tartrate (LOPRESSOR) 100 MG tablet Take 1 tablet (100 mg total) by mouth once for 1 dose. Two hours prior to CT testing 180 tablet 3  . pantoprazole (PROTONIX) 40 MG tablet Take 1 tablet (40 mg total) by mouth daily. 42 tablet 0  . potassium chloride SA (K-DUR,KLOR-CON) 20 MEQ tablet Take 2 tablets (40 mEq total) by mouth daily. (Patient taking differently: Take 20 mEq by mouth daily. ) 180 tablet 3  . ranolazine (RANEXA) 500 MG 12 hr tablet Take 1 tablet (500 mg total) by mouth 2 (two) times daily. 180 tablet 3  . rosuvastatin (CRESTOR) 10 MG tablet Take 1 tablet (10 mg total) by mouth daily. (Patient taking differently: Take 10 mg by mouth at bedtime. ) 90 tablet 3  . Vitamin D, Ergocalciferol, (DRISDOL) 1.25 MG (50000 UT) CAPS capsule Take 1 capsule (50,000 Units total) by mouth every 7 (seven) days. (Patient not taking: Reported on 10/04/2019) 12 capsule 0  . VOLTAREN 1 % GEL Apply 1 application topically 2 (two) times daily as needed (Knee pain).      No current facility-administered medications for this visit.    Allergies  Allergen Reactions  . Ace Inhibitors Palpitations    Social History   Socioeconomic History  . Marital status: Single    Spouse name: Not  on file  . Number of children: 1  . Years of education: Not on file  . Highest education level: Not on file  Occupational History  . Occupation: Academic librarian   Tobacco Use  . Smoking status: Former Smoker    Packs/day: 0.50    Types: Cigarettes    Start date: 2019  . Smokeless tobacco: Never Used  . Tobacco comment: restarted back this year  Substance and Sexual Activity  . Alcohol use: No    Alcohol/week: 4.0 standard drinks    Types: 4 Cans of beer per week    Comment: Intermittent  . Drug  use: No  . Sexual activity: Not on file  Other Topics Concern  . Not on file  Social History Narrative  . Not on file   Social Determinants of Health   Financial Resource Strain:   . Difficulty of Paying Living Expenses: Not on file  Food Insecurity:   . Worried About Charity fundraiser in the Last Year: Not on file  . Ran Out of Food in the Last Year: Not on file  Transportation Needs:   . Lack of Transportation (Medical): Not on file  . Lack of Transportation (Non-Medical): Not on file  Physical Activity:   . Days of Exercise per Week: Not on file  . Minutes of Exercise per Session: Not on file  Stress:   . Feeling of Stress : Not on file  Social Connections:   . Frequency of Communication with Friends and Family: Not on file  . Frequency of Social Gatherings with Friends and Family: Not on file  . Attends Religious Services: Not on file  . Active Member of Clubs or Organizations: Not on file  . Attends Archivist Meetings: Not on file  . Marital Status: Not on file  Intimate Partner Violence:   . Fear of Current or Ex-Partner: Not on file  . Emotionally Abused: Not on file  . Physically Abused: Not on file  . Sexually Abused: Not on file    Family History  Problem Relation Age of Onset  . Asthma Mother   . Diabetes Father   . Heart disease Father        Died of presumed heart attack - 64s  . Lung disease Sister   . Heart disease Sister        Twin sister has  heart issue, unclear what kind  . Thyroid disease Neg Hx     ROS- All systems are reviewed and negative except as per the HPI above  Physical Exam: There were no vitals filed for this visit. Wt Readings from Last 3 Encounters:  10/14/19 219 lb (99.3 kg)  09/26/19 215 lb (97.5 kg)  09/07/19 216 lb (98 kg)    Labs: Lab Results  Component Value Date   NA 138 10/13/2019   K 4.4 10/13/2019   CL 103 10/13/2019   CO2 25 10/13/2019   GLUCOSE 131 (H) 10/13/2019   BUN 27 (H) 10/13/2019   CREATININE 1.73 (H) 10/13/2019   CALCIUM 8.6 (L) 10/13/2019   PHOS 3.5 08/07/2018   MG 2.2 08/16/2018   Lab Results  Component Value Date   INR 2.46 08/05/2018   Lab Results  Component Value Date   CHOL 182 04/15/2019   HDL 74.30 04/15/2019   LDLCALC 86 04/15/2019   TRIG 107.0 04/15/2019    GEN- The patient is well appearing obese female, alert and oriented x 3 today.   HEENT-head normocephalic, atraumatic, sclera clear, conjunctiva pink, hearing intact, trachea midline. Lungs- Clear to ausculation bilaterally, normal work of breathing Heart- Regular rate and rhythm, no murmurs, rubs or gallops  GI- soft, NT, ND, + BS Extremities- no clubbing, cyanosis, or edema MS- no significant deformity or atrophy Skin- no rash or lesion Psych- euthymic mood, full affect Neuro- strength and sensation are intact   EKG-SR HR 72, nonspecific IVCD, PVC, PR 202, QRs 122, QTc 492  Epic records reviewed  Echo 07/19/19  1. The left ventricle has mildly reduced systolic function, with an ejection fraction of 45-50%. The cavity size was normal. There is severe concentric  left ventricular hypertrophy. Left ventricular diastolic Doppler parameters are consistent with  impaired relaxation.  2. The right ventricle has normal systolic function. The cavity was normal. There is no increase in right ventricular wall thickness.  3. Left atrial size was mildly dilated.  4. The aortic valve is tricuspid. Mild  calcification of the aortic valve. No aortic stenosis.   Assessment and Plan: 1. Persistent symptomatic Afib/flutter S/p afib ablation with Dr Curt Bears 10/13/19. Patient appears to be maintaining SR. Continue amiodarone 200 mg daily for now. Continue Eliquis 5 mg BID with no missed doses for at least 3 months post ablation.  This patients CHA2DS2-VASc Score and unadjusted Ischemic Stroke Rate (% per year) is equal to 11.2 % stroke rate/year from a score of 7  Above score calculated as 1 point each if present [CHF, HTN, DM, Vascular=MI/PAD/Aortic Plaque, Age if 65-74, or Female] Above score calculated as 2 points each if present [Age > 75, or Stroke/TIA/TE]  2. HTN Stable, no changes today.  3. Chronic systolic CHF NICM presumably from afib. Last echo showed EF 45-50%. No signs or symptoms of fluid overload. Followed by Dr [name].   4. Suspected OSA Patient has refused sleep study previously.    Follow up with Dr Curt Bears as scheduled.    [facility] [address] [place], [place] [address] [phone]

## 2019-11-22 DIAGNOSIS — R338 Other retention of urine: Secondary | ICD-10-CM | POA: Diagnosis not present

## 2019-11-25 ENCOUNTER — Other Ambulatory Visit (HOSPITAL_COMMUNITY): Payer: Self-pay | Admitting: Student

## 2019-12-06 ENCOUNTER — Encounter (HOSPITAL_COMMUNITY): Payer: Self-pay | Admitting: Physician Assistant

## 2019-12-08 DIAGNOSIS — R338 Other retention of urine: Secondary | ICD-10-CM | POA: Diagnosis not present

## 2019-12-08 DIAGNOSIS — N3020 Other chronic cystitis without hematuria: Secondary | ICD-10-CM | POA: Diagnosis not present

## 2019-12-20 ENCOUNTER — Other Ambulatory Visit (HOSPITAL_COMMUNITY): Payer: Self-pay | Admitting: Cardiology

## 2019-12-20 DIAGNOSIS — R338 Other retention of urine: Secondary | ICD-10-CM | POA: Diagnosis not present

## 2020-01-04 DIAGNOSIS — M170 Bilateral primary osteoarthritis of knee: Secondary | ICD-10-CM | POA: Diagnosis not present

## 2020-01-17 ENCOUNTER — Ambulatory Visit: Payer: Medicare HMO | Admitting: Cardiology

## 2020-01-17 ENCOUNTER — Other Ambulatory Visit: Payer: Self-pay

## 2020-01-17 ENCOUNTER — Encounter: Payer: Self-pay | Admitting: Cardiology

## 2020-01-17 VITALS — BP 124/78 | HR 74 | Ht 65.0 in | Wt 201.0 lb

## 2020-01-17 DIAGNOSIS — I4819 Other persistent atrial fibrillation: Secondary | ICD-10-CM

## 2020-01-17 NOTE — Addendum Note (Signed)
Addended by: Stanton Kidney on: 01/17/2020 02:19 PM   Modules accepted: Orders

## 2020-01-17 NOTE — Progress Notes (Signed)
Electrophysiology Office Note   Date:  01/17/2020   ID:  Patricia Wagner, Patricia Wagner Feb 28, 1951, MRN [id number]  PCP:  Patient, No Pcp Per  Cardiologist:  [place] Primary Electrophysiologist:  Gustave Lindeman Meredith Leeds, MD    No chief complaint on file.    History of Present Illness: Patricia Wagner is a 69 y.o. female who is being seen today for the evaluation of CHF, atrial fibrillation at the request of Biagio Borg, MD. Presenting today for electrophysiology evaluation. She has a history of persistent atrial fibrillation, nonischemic cardiomyopathy, CHF, hypertension, and hyperlipidemia. She has had atrial fibrillation since December 2017. She is had multiple cardioversions and has been loaded on amiodarone. She was admitted to the hospital in June 2018 in cardiogenic shock in the setting of persistent atrial fibrillation. EF was found to be 15% with severe bi-V dysfunction. She was started on amiodarone. She was successfully cardioverted to sinus rhythm after initiation of IV amiodarone.Since that time she has done well without recurrence of atrial fibrillation.  She is now status post AF ablation 10/13/2019.  Today, denies symptoms of palpitations, chest pain, shortness of breath, orthopnea, PND, lower extremity edema, claudication, dizziness, presyncope, syncope, bleeding, or neurologic sequela. The patient is tolerating medications without difficulties.  Overall she is doing well.  She has noted no further episodes of atrial fibrillation.  She has plans for knee surgery upcoming.   Past Medical History:  Diagnosis Date  . Acute blood loss anemia 03/05/2018  . Acute hypercapnic respiratory failure ([place]) 04/30/2017  . Acute renal failure superimposed on chronic kidney disease ([place]) 10/20/2016  . Arthritis   . Atrial fibrillation ([place])    a. s/p multiple cardioversions; failed tikosyn/sotalol.  . Bradycardia 11/20/2016  . CAD in native artery, s/p cardiac cath with non obstructive CAD 10/24/2016  .  Cerebrovascular accident (CVA) due to embolism of left cerebellar artery ([place])   . CHF (congestive heart failure) ([place])   . Chronic anticoagulation 03/15/2018  . Chronic atrial fibrillation ([place]) 03/05/2018  . Chronic systolic (congestive) heart failure ([place]) 03/05/2018  . CKD (chronic kidney disease) stage 3, GFR 30-59 ml/min   . Coagulopathy ([place]) 03/05/2018  . DIVERTICULITIS, HX OF 07/25/2007  . Diverticulosis of colon with hemorrhage 07/22/2007   Qualifier: Diagnosis of  By: [name]    . DIVERTICULOSIS, COLON 07/22/2007  . Dyspnea   . Dysuria 07/21/2017  . Edema, peripheral    a. chronic BLE edema, R>L. Prior trauma from dog attack and accident.  . Essential hypertension 07/22/2007   Qualifier: Diagnosis of  By: [name]    . HLD (hyperlipidemia) 02/03/2008   Qualifier: Diagnosis of  By: [name], [name]   . HYPERLIPIDEMIA 02/03/2008  . Hypersomnia    declines w/u  . Hypertension   . Hypotension (arterial) 04/30/2017  . MENOPAUSAL DISORDER 01/09/2011  . Morbid obesity ([place]) 07/22/2007  . NICM (nonischemic cardiomyopathy) ([place]) 10/24/2016  . PAF (paroxysmal atrial fibrillation) ([place])   . Raynaud's syndrome 07/22/2007  . Stroke ([place]) 2017  . Suspected sleep apnea 05/20/2017  . THYROID NODULE, RIGHT 01/04/2010  . VITAMIN D DEFICIENCY 01/09/2011   Qualifier: Diagnosis of  By: [name], [name]    Past Surgical History:  Procedure Laterality Date  . ATRIAL FIBRILLATION ABLATION N/A 10/13/2019   Procedure: ATRIAL FIBRILLATION ABLATION;  Surgeon: [name], MD;  Location: [facility];  Service: Cardiovascular;  Laterality: N/A;  . CARDIAC CATHETERIZATION N/A 10/23/2016   Procedure: Left Heart  Cath and Coronary Angiography;  Surgeon: [name], MD;  Location: [facility];  Service: Cardiovascular;  Laterality: N/A;  . CARDIOVERSION N/A 10/31/2016   Procedure: CARDIOVERSION;  Surgeon: [name], MD;  Location: [facility];  Service: Cardiovascular;   Laterality: N/A;  . CARDIOVERSION N/A 11/03/2016   Procedure: CARDIOVERSION;  Surgeon: [name], MD;  Location: [facility];  Service: Cardiovascular;  Laterality: N/A;  . CARDIOVERSION N/A 11/18/2016   Procedure: CARDIOVERSION;  Surgeon: [name], MD;  Location: [facility];  Service: Cardiovascular;  Laterality: N/A;  . CARDIOVERSION N/A 03/25/2017   Procedure: CARDIOVERSION;  Surgeon: [name], MD;  Location[facility] ENDOSCOPY;  Service: Cardiovascular;  Laterality: N/A;  . CARDIOVERSION N/A 05/06/2017   Procedure: CARDIOVERSION;  Surgeon: [name], MD;  Location: [facility] ENDOSCOPY;  Service: Cardiovascular;  Laterality: N/A;  . CARDIOVERSION N/A 05/12/2017   Procedure: CARDIOVERSION;  Surgeon: [name], MD;  Location: [facility] ENDOSCOPY;  Service: Cardiovascular;  Laterality: N/A;  . COLONOSCOPY N/A 12/04/2017   Procedure: COLONOSCOPY;  Surgeon: [name], MD;  Location: [facility];  Service: Endoscopy;  Laterality: N/A;  . COLONOSCOPY W/ POLYPECTOMY  02/2011   pan diverticulosis.  tubular adenoma without dysplasia on 5 mm sigmoid polyp.  Dr Fuller Plan.    Marland Kitchen PARTIAL HYSTERECTOMY     1 OVARY LEFT  . TEE WITHOUT CARDIOVERSION N/A 10/31/2016   Procedure: TRANSESOPHAGEAL ECHOCARDIOGRAM (TEE);  Surgeon: [name], MD;  Location: [facility] ENDOSCOPY;  Service: Cardiovascular;  Laterality: N/A;  . TEE WITHOUT CARDIOVERSION N/A 03/25/2017   Procedure: TRANSESOPHAGEAL ECHOCARDIOGRAM (TEE);  Surgeon: [name], MD;  Location: [facility] ENDOSCOPY;  Service: Cardiovascular;  Laterality: N/A;     Current Outpatient Medications  Medication Sig Dispense Refill  . acetaminophen (TYLENOL) 325 MG tablet Take 2 tablets (650 mg total) by mouth every 4 (four) hours as needed for headache or mild pain.    Marland Kitchen amiodarone (PACERONE) 200 MG tablet Take 1 tablet (200 mg total) by mouth daily. 90 tablet 3  . apixaban (ELIQUIS) 5 MG TABS tablet Take 1 tablet (5 mg total) by mouth 2 (two) times  daily. 180 tablet 3  . Cholecalciferol (VITAMIN D3) 25 MCG (1000 UT) CAPS Take 1,000 Units by mouth daily.    . furosemide (LASIX) 80 MG tablet TAKE 1 TABLET BY MOUTH EVERY DAY 90 tablet 3  . isosorbide mononitrate (IMDUR) 30 MG 24 hr tablet TAKE HALF A TABLET BY MOUTH EVERY DAY (Patient taking differently: Take 15 mg by mouth daily. ) 45 tablet 1  . pantoprazole (PROTONIX) 40 MG tablet TAKE 1 TABLET BY MOUTH EVERY DAY 42 tablet 0  . potassium chloride SA (K-DUR,KLOR-CON) 20 MEQ tablet Take 2 tablets (40 mEq total) by mouth daily. (Patient taking differently: Take 20 mEq by mouth daily. ) 180 tablet 3  . ranolazine (RANEXA) 500 MG 12 hr tablet TAKE 1 TABLET BY MOUTH TWICE A DAY 180 tablet 3  . rosuvastatin (CRESTOR) 10 MG tablet Take 1 tablet (10 mg total) by mouth daily. (Patient taking differently: Take 10 mg by mouth at bedtime. ) 90 tablet 3  . Vitamin D, Ergocalciferol, (DRISDOL) 1.25 MG (50000 UT) CAPS capsule Take 1 capsule (50,000 Units total) by mouth every 7 (seven) days. 12 capsule 0  . VOLTAREN 1 % GEL Apply 1 application topically 2 (two) times daily as needed (Knee pain).     . hydrALAZINE (APRESOLINE) 25 MG tablet Take 0.5 tablets (12.5 mg total)  by mouth 3 (three) times daily. (Patient taking differently: Take 25 mg by mouth 3 (three) times daily. ) 270 tablet 3   No current facility-administered medications for this visit.    Allergies:   Ace inhibitors   Social History:  The patient  reports that she has quit smoking. Her smoking use included cigarettes. She started smoking about 2 years ago. She smoked 0.50 packs per day. She has never used smokeless tobacco. She reports that she does not drink alcohol or use drugs.   Family History:  The patient's family history includes Asthma in her mother; Diabetes in her father; Heart disease in her father and sister; Lung disease in her sister.   ROS:  Please see the history of present illness.   Otherwise, review of systems is positive  for none.   All other systems are reviewed and negative.   PHYSICAL EXAM: VS:  BP 124/78   Pulse 74   Ht 5\' 5"  (1.651 m)   Wt 201 lb (91.2 kg)   SpO2 93%   BMI 33.45 kg/m  , BMI Body mass index is 33.45 kg/m. GEN: Well nourished, well developed, in no acute distress  HEENT: normal  Neck: no JVD, carotid bruits, or masses Cardiac: RRR; no murmurs, rubs, or gallops,no edema  Respiratory:  clear to auscultation bilaterally, normal work of breathing GI: soft, nontender, nondistended, + BS MS: no deformity or atrophy  Skin: warm and dry Neuro:  Strength and sensation are intact Psych: euthymic mood, full affect  EKG:  EKG is ordered today. Personal review of the ekg ordered shows this rhythm, rate 74  Recent Labs: 09/14/2019: ALT 6; TSH 0.430 10/13/2019: BUN 27; Creatinine, Ser 1.73; Hemoglobin 12.2; Platelets 173; Potassium 4.4; Sodium 138    Lipid Panel     Component Value Date/Time   CHOL 182 04/15/2019 1549   TRIG 107.0 04/15/2019 1549   HDL 74.30 04/15/2019 1549   CHOLHDL 2 04/15/2019 1549   VLDL 21.4 04/15/2019 1549   LDLCALC 86 04/15/2019 1549   LDLDIRECT 121.6 03/15/2012 1646     Wt Readings from Last 3 Encounters:  01/17/20 201 lb (91.2 kg)  11/14/19 221 lb (100.2 kg)  10/14/19 219 lb (99.3 kg)      Other studies Reviewed: Additional studies/ records that were reviewed today include: TTE 07/19/19  Review of the above records today demonstrates:   1. The left ventricle has mildly reduced systolic function, with an ejection fraction of 45-50%. The cavity size was normal. There is severe concentric left ventricular hypertrophy. Left ventricular diastolic Doppler parameters are consistent with  impaired relaxation.  2. The right ventricle has normal systolic function. The cavity was normal. There is no increase in right ventricular wall thickness.  3. Left atrial size was mildly dilated.  4. The aortic valve is tricuspid. Mild calcification of the aortic valve.  No aortic stenosis.  LHC 10/23/16 1. Mild, non-obstructive coronary artery disease consistent with non-ischemic cardiomyopathy. 2. Mildly elevated left ventricular filling pressure.   ASSESSMENT AND PLAN:  1.  Persistent atrial fibrillation: Currently on Eliquis and amiodarone.  Is now status post AF ablation 10/13/2019.  CHA2DS2-VASc of 3.  He remains in sinus rhythm.  We Minna Dumire plan to stop her amiodarone today as her TSH has gone up.  2.  Chronic systolic heart failure due to nonischemic cardiomyopathy: Today due to a tachycardia mediated cardiomyopathy due to her atrial fibrillation.  Echo shows an ejection fraction of 40 to 45%.  Plan per  heart failure cardiology.    3.  Preop cardiovascular exam: Patient has plans for knee replacement.  She is greater than 3 months out from ablation and thus would be able to hold her anticoagulation.  She would be at intermediate risk for an intermediate risk procedure.  Would be able to stop anticoagulation 2 days prior to her operation and restarted as soon as possible per the surgeon recommendations.  Current medicines are reviewed at length with the patient today.   The patient does not have concerns regarding her medicines.  The following changes were made today: Stop amiodarone  Labs/ tests ordered today include:  Orders Placed This Encounter  Procedures  . EKG 12-Lead     Disposition:   FU with Shedrick Sarli 3 months  Signed, Corianna Avallone Meredith Leeds, MD  01/17/2020 2:13 PM     [address] [place] [address] [phone] (office) [phone] (fax)

## 2020-01-17 NOTE — Patient Instructions (Signed)
Medication Instructions:  Your physician has recommended you make the following change in your medication:  1. STOP Amiodarone *If you need a refill on your cardiac medications before your next appointment, please call your pharmacy*  Lab Work: None ordered  If you have labs (blood work) drawn today and your tests are completely normal, you will receive your results only by: Marland Kitchen MyChart Message (if you have MyChart) OR . A paper copy in the mail If you have any lab test that is abnormal or we need to change your treatment, we will call you to review the results.  Testing/Procedures: None ordered  Follow-Up: At [facility], you and your health needs are our priority.  As part of our continuing mission to provide you with exceptional heart care, we have created designated Provider Care Teams.  These Care Teams include your primary Cardiologist (physician) and Advanced Practice Providers (APPs -  Physician Assistants and Nurse Practitioners) who all work together to provide you with the care you need, when you need it.  Your next appointment:   3 month(s)  The format for your next appointment:   In Person  Provider:   Allegra Lai, MD  Thank you for choosing [facility]!!   Trinidad Curet, RN [phone]

## 2020-01-18 ENCOUNTER — Inpatient Hospital Stay (HOSPITAL_COMMUNITY): Payer: Medicare HMO

## 2020-01-18 ENCOUNTER — Inpatient Hospital Stay (HOSPITAL_COMMUNITY): Payer: Medicare HMO | Admitting: Anesthesiology

## 2020-01-18 ENCOUNTER — Encounter (HOSPITAL_COMMUNITY)
Admission: EM | Disposition: A | Discharge status: Home / Self Care | Payer: Self-pay | Source: Home / Self Care | Attending: Internal Medicine

## 2020-01-18 ENCOUNTER — Inpatient Hospital Stay (HOSPITAL_COMMUNITY)
Admission: EM | Admit: 2020-01-18 | Discharge: 2020-01-20 | DRG: 378 | Disposition: A | Discharge status: Home / Self Care | Payer: Medicare HMO | Attending: Internal Medicine | Admitting: Internal Medicine

## 2020-01-18 ENCOUNTER — Encounter (HOSPITAL_COMMUNITY): Payer: Self-pay | Admitting: Emergency Medicine

## 2020-01-18 DIAGNOSIS — J9621 Acute and chronic respiratory failure with hypoxia: Secondary | ICD-10-CM | POA: Diagnosis not present

## 2020-01-18 DIAGNOSIS — N179 Acute kidney failure, unspecified: Secondary | ICD-10-CM | POA: Diagnosis present

## 2020-01-18 DIAGNOSIS — E785 Hyperlipidemia, unspecified: Secondary | ICD-10-CM | POA: Diagnosis present

## 2020-01-18 DIAGNOSIS — Z7901 Long term (current) use of anticoagulants: Secondary | ICD-10-CM | POA: Diagnosis not present

## 2020-01-18 DIAGNOSIS — Z6833 Body mass index (BMI) 33.0-33.9, adult: Secondary | ICD-10-CM | POA: Diagnosis not present

## 2020-01-18 DIAGNOSIS — I2510 Atherosclerotic heart disease of native coronary artery without angina pectoris: Secondary | ICD-10-CM | POA: Diagnosis not present

## 2020-01-18 DIAGNOSIS — Z8673 Personal history of transient ischemic attack (TIA), and cerebral infarction without residual deficits: Secondary | ICD-10-CM | POA: Diagnosis not present

## 2020-01-18 DIAGNOSIS — K922 Gastrointestinal hemorrhage, unspecified: Secondary | ICD-10-CM | POA: Diagnosis not present

## 2020-01-18 DIAGNOSIS — R0902 Hypoxemia: Secondary | ICD-10-CM | POA: Diagnosis not present

## 2020-01-18 DIAGNOSIS — N184 Chronic kidney disease, stage 4 (severe): Secondary | ICD-10-CM | POA: Diagnosis present

## 2020-01-18 DIAGNOSIS — K5790 Diverticulosis of intestine, part unspecified, without perforation or abscess without bleeding: Secondary | ICD-10-CM | POA: Diagnosis not present

## 2020-01-18 DIAGNOSIS — N139 Obstructive and reflux uropathy, unspecified: Secondary | ICD-10-CM | POA: Diagnosis present

## 2020-01-18 DIAGNOSIS — Z86010 Personal history of colonic polyps: Secondary | ICD-10-CM

## 2020-01-18 DIAGNOSIS — I10 Essential (primary) hypertension: Secondary | ICD-10-CM | POA: Diagnosis present

## 2020-01-18 DIAGNOSIS — I5042 Chronic combined systolic (congestive) and diastolic (congestive) heart failure: Secondary | ICD-10-CM | POA: Diagnosis present

## 2020-01-18 DIAGNOSIS — Z79899 Other long term (current) drug therapy: Secondary | ICD-10-CM | POA: Diagnosis not present

## 2020-01-18 DIAGNOSIS — E559 Vitamin D deficiency, unspecified: Secondary | ICD-10-CM | POA: Diagnosis present

## 2020-01-18 DIAGNOSIS — Z720 Tobacco use: Secondary | ICD-10-CM | POA: Diagnosis not present

## 2020-01-18 DIAGNOSIS — D62 Acute posthemorrhagic anemia: Secondary | ICD-10-CM | POA: Diagnosis present

## 2020-01-18 DIAGNOSIS — I5021 Acute systolic (congestive) heart failure: Secondary | ICD-10-CM | POA: Diagnosis not present

## 2020-01-18 DIAGNOSIS — Z87891 Personal history of nicotine dependence: Secondary | ICD-10-CM | POA: Diagnosis not present

## 2020-01-18 DIAGNOSIS — I255 Ischemic cardiomyopathy: Secondary | ICD-10-CM | POA: Diagnosis not present

## 2020-01-18 DIAGNOSIS — K648 Other hemorrhoids: Secondary | ICD-10-CM | POA: Diagnosis present

## 2020-01-18 DIAGNOSIS — I428 Other cardiomyopathies: Secondary | ICD-10-CM | POA: Diagnosis present

## 2020-01-18 DIAGNOSIS — E663 Overweight: Secondary | ICD-10-CM | POA: Diagnosis present

## 2020-01-18 DIAGNOSIS — Z888 Allergy status to other drugs, medicaments and biological substances status: Secondary | ICD-10-CM

## 2020-01-18 DIAGNOSIS — Z20822 Contact with and (suspected) exposure to covid-19: Secondary | ICD-10-CM | POA: Diagnosis not present

## 2020-01-18 DIAGNOSIS — Z8249 Family history of ischemic heart disease and other diseases of the circulatory system: Secondary | ICD-10-CM | POA: Diagnosis not present

## 2020-01-18 DIAGNOSIS — I130 Hypertensive heart and chronic kidney disease with heart failure and stage 1 through stage 4 chronic kidney disease, or unspecified chronic kidney disease: Secondary | ICD-10-CM | POA: Diagnosis not present

## 2020-01-18 DIAGNOSIS — M1710 Unilateral primary osteoarthritis, unspecified knee: Secondary | ICD-10-CM | POA: Diagnosis present

## 2020-01-18 DIAGNOSIS — I482 Chronic atrial fibrillation, unspecified: Secondary | ICD-10-CM | POA: Diagnosis not present

## 2020-01-18 DIAGNOSIS — I5022 Chronic systolic (congestive) heart failure: Secondary | ICD-10-CM | POA: Diagnosis not present

## 2020-01-18 DIAGNOSIS — K5731 Diverticulosis of large intestine without perforation or abscess with bleeding: Secondary | ICD-10-CM | POA: Diagnosis not present

## 2020-01-18 DIAGNOSIS — K625 Hemorrhage of anus and rectum: Secondary | ICD-10-CM | POA: Diagnosis not present

## 2020-01-18 DIAGNOSIS — Z03818 Encounter for observation for suspected exposure to other biological agents ruled out: Secondary | ICD-10-CM | POA: Diagnosis not present

## 2020-01-18 DIAGNOSIS — I5043 Acute on chronic combined systolic (congestive) and diastolic (congestive) heart failure: Secondary | ICD-10-CM | POA: Diagnosis present

## 2020-01-18 DIAGNOSIS — I4819 Other persistent atrial fibrillation: Secondary | ICD-10-CM | POA: Diagnosis not present

## 2020-01-18 DIAGNOSIS — Z960 Presence of urogenital implants: Secondary | ICD-10-CM | POA: Diagnosis not present

## 2020-01-18 DIAGNOSIS — R58 Hemorrhage, not elsewhere classified: Secondary | ICD-10-CM | POA: Diagnosis not present

## 2020-01-18 DIAGNOSIS — R339 Retention of urine, unspecified: Secondary | ICD-10-CM | POA: Diagnosis not present

## 2020-01-18 HISTORY — PX: FLEXIBLE SIGMOIDOSCOPY: SHX5431

## 2020-01-18 HISTORY — PX: SCLEROTHERAPY: SHX6841

## 2020-01-18 HISTORY — PX: HEMOSTASIS CLIP PLACEMENT: SHX6857

## 2020-01-18 LAB — CBC WITH DIFFERENTIAL/PLATELET
Abs Immature Granulocytes: 0.11 10*3/uL — ABNORMAL HIGH (ref 0.00–0.07)
Basophils Absolute: 0 10*3/uL (ref 0.0–0.1)
Basophils Relative: 0 %
Eosinophils Absolute: 0.1 10*3/uL (ref 0.0–0.5)
Eosinophils Relative: 2 %
HCT: 37.4 % (ref 36.0–46.0)
Hemoglobin: 11 g/dL — ABNORMAL LOW (ref 12.0–15.0)
Immature Granulocytes: 2 %
Lymphocytes Relative: 12 %
Lymphs Abs: 0.9 10*3/uL (ref 0.7–4.0)
MCH: 28.4 pg (ref 26.0–34.0)
MCHC: 29.4 g/dL — ABNORMAL LOW (ref 30.0–36.0)
MCV: 96.4 fL (ref 80.0–100.0)
Monocytes Absolute: 0.6 10*3/uL (ref 0.1–1.0)
Monocytes Relative: 8 %
Neutro Abs: 5.3 10*3/uL (ref 1.7–7.7)
Neutrophils Relative %: 76 %
Platelets: 269 10*3/uL (ref 150–400)
RBC: 3.88 MIL/uL (ref 3.87–5.11)
RDW: 15.2 % (ref 11.5–15.5)
WBC: 6.9 10*3/uL (ref 4.0–10.5)
nRBC: 0 % (ref 0.0–0.2)

## 2020-01-18 LAB — COMPREHENSIVE METABOLIC PANEL
ALT: 8 U/L (ref 0–44)
AST: 16 U/L (ref 15–41)
Albumin: 2.8 g/dL — ABNORMAL LOW (ref 3.5–5.0)
Alkaline Phosphatase: 47 U/L (ref 38–126)
Anion gap: 14 (ref 5–15)
BUN: 38 mg/dL — ABNORMAL HIGH (ref 8–23)
CO2: 25 mmol/L (ref 22–32)
Calcium: 9.3 mg/dL (ref 8.9–10.3)
Chloride: 99 mmol/L (ref 98–111)
Creatinine, Ser: 3.07 mg/dL — ABNORMAL HIGH (ref 0.44–1.00)
GFR calc Af Amer: 18 mL/min — ABNORMAL LOW (ref >60)
GFR calc non Af Amer: 15 mL/min — ABNORMAL LOW (ref >60)
Glucose, Bld: 100 mg/dL — ABNORMAL HIGH (ref 70–99)
Potassium: 3.7 mmol/L (ref 3.5–5.1)
Sodium: 138 mmol/L (ref 135–145)
Total Bilirubin: 0.7 mg/dL (ref 0.3–1.2)
Total Protein: 6.6 g/dL (ref 6.5–8.1)

## 2020-01-18 LAB — HEMOGLOBIN AND HEMATOCRIT, BLOOD
HCT: 28.7 % — ABNORMAL LOW (ref 36.0–46.0)
Hemoglobin: 8.8 g/dL — ABNORMAL LOW (ref 12.0–15.0)

## 2020-01-18 LAB — CBC
HCT: 26.3 % — ABNORMAL LOW (ref 36.0–46.0)
Hemoglobin: 8.1 g/dL — ABNORMAL LOW (ref 12.0–15.0)
MCH: 29.1 pg (ref 26.0–34.0)
MCHC: 30.8 g/dL (ref 30.0–36.0)
MCV: 94.6 fL (ref 80.0–100.0)
Platelets: 219 10*3/uL (ref 150–400)
RBC: 2.78 MIL/uL — ABNORMAL LOW (ref 3.87–5.11)
RDW: 15.3 % (ref 11.5–15.5)
WBC: 6.6 10*3/uL (ref 4.0–10.5)
nRBC: 0 % (ref 0.0–0.2)

## 2020-01-18 LAB — PROTIME-INR
INR: 1.6 — ABNORMAL HIGH (ref 0.8–1.2)
Prothrombin Time: 18.7 seconds — ABNORMAL HIGH (ref 11.4–15.2)

## 2020-01-18 LAB — HIV ANTIBODY (ROUTINE TESTING W REFLEX): HIV Screen 4th Generation wRfx: NONREACTIVE

## 2020-01-18 LAB — SARS CORONAVIRUS 2 (TAT 6-24 HRS): SARS Coronavirus 2: NEGATIVE

## 2020-01-18 SURGERY — SIGMOIDOSCOPY, FLEXIBLE
Anesthesia: Monitor Anesthesia Care

## 2020-01-18 SURGERY — CANCELLED PROCEDURE

## 2020-01-18 MED ORDER — ACETAMINOPHEN 325 MG PO TABS
650.0000 mg | ORAL_TABLET | Freq: Four times a day (QID) | ORAL | Status: DC | PRN
  Administered 2020-01-19 (×2): 650 mg via ORAL
  Filled 2020-01-18 (×2): qty 2

## 2020-01-18 MED ORDER — RANOLAZINE ER 500 MG PO TB12
500.0000 mg | ORAL_TABLET | Freq: Two times a day (BID) | ORAL | Status: DC
  Administered 2020-01-18 – 2020-01-20 (×4): 500 mg via ORAL
  Filled 2020-01-18 (×4): qty 1

## 2020-01-18 MED ORDER — SODIUM CHLORIDE 0.9 % IV SOLN: INTRAVENOUS | Status: DC | PRN

## 2020-01-18 MED ORDER — HYDRALAZINE HCL 25 MG PO TABS: 12.5000 mg | ORAL_TABLET | Freq: Three times a day (TID) | ORAL | Status: DC

## 2020-01-18 MED ORDER — PANTOPRAZOLE SODIUM 40 MG PO TBEC
40.0000 mg | DELAYED_RELEASE_TABLET | Freq: Every day | ORAL | Status: DC
  Administered 2020-01-19 – 2020-01-20 (×2): 40 mg via ORAL
  Filled 2020-01-18 (×2): qty 1

## 2020-01-18 MED ORDER — EPINEPHRINE 1 MG/10ML IJ SOSY
PREFILLED_SYRINGE | INTRAMUSCULAR | Status: AC
  Filled 2020-01-18: qty 10

## 2020-01-18 MED ORDER — ISOSORBIDE MONONITRATE ER 30 MG PO TB24
15.0000 mg | ORAL_TABLET | Freq: Every day | ORAL | Status: DC
  Administered 2020-01-19 – 2020-01-20 (×2): 15 mg via ORAL
  Filled 2020-01-18 (×2): qty 1

## 2020-01-18 MED ORDER — PROPOFOL 500 MG/50ML IV EMUL
INTRAVENOUS | Status: DC | PRN
  Administered 2020-01-18: 100 ug/kg/min via INTRAVENOUS

## 2020-01-18 MED ORDER — PHENYLEPHRINE HCL (PRESSORS) 10 MG/ML IV SOLN
INTRAVENOUS | Status: DC | PRN
  Administered 2020-01-18 (×2): 120 ug via INTRAVENOUS

## 2020-01-18 MED ORDER — LACTATED RINGERS IV SOLN: INTRAVENOUS | Status: AC

## 2020-01-18 MED ORDER — ROSUVASTATIN CALCIUM 5 MG PO TABS
10.0000 mg | ORAL_TABLET | Freq: Every day | ORAL | Status: DC
  Administered 2020-01-18 – 2020-01-19 (×2): 10 mg via ORAL
  Filled 2020-01-18 (×2): qty 2

## 2020-01-18 MED ORDER — PROPOFOL 10 MG/ML IV BOLUS
INTRAVENOUS | Status: DC | PRN
  Administered 2020-01-18: 30 mg via INTRAVENOUS

## 2020-01-18 MED ORDER — TECHNETIUM TC 99M-LABELED RED BLOOD CELLS IV KIT
26.0000 | PACK | Freq: Once | INTRAVENOUS | Status: AC | PRN
  Administered 2020-01-18: 26 via INTRAVENOUS

## 2020-01-18 MED ORDER — SODIUM CHLORIDE (PF) 0.9 % IJ SOLN
PREFILLED_SYRINGE | INTRAMUSCULAR | Status: DC | PRN
  Administered 2020-01-18: 5.5 mL

## 2020-01-18 MED ORDER — SODIUM CHLORIDE 0.9 % IV BOLUS
500.0000 mL | Freq: Once | INTRAVENOUS | Status: AC
  Administered 2020-01-18: 500 mL via INTRAVENOUS

## 2020-01-18 MED ORDER — ACETAMINOPHEN 650 MG RE SUPP: 650.0000 mg | Freq: Four times a day (QID) | RECTAL | Status: DC | PRN

## 2020-01-18 MED ORDER — LIDOCAINE HCL (CARDIAC) PF 100 MG/5ML IV SOSY
PREFILLED_SYRINGE | INTRAVENOUS | Status: DC | PRN
  Administered 2020-01-18: 100 mg via INTRAVENOUS

## 2020-01-18 NOTE — Transfer of Care (Signed)
Immediate Anesthesia Transfer of Care Note  Patient: Patricia Wagner  Procedure(s) Performed: FLEXIBLE SIGMOIDOSCOPY (N/A ) HEMOSTASIS CLIP PLACEMENT SCLEROTHERAPY  Patient Location: PACU and Endoscopy Unit  Anesthesia Type:MAC  Level of Consciousness: awake, alert  and oriented  Airway & Oxygen Therapy: Patient Spontanous Breathing and Patient connected to nasal cannula oxygen  Post-op Assessment: Report given to RN, Post -op Vital signs reviewed and stable and Patient moving all extremities  Post vital signs: Reviewed and stable  Last Vitals:  Vitals Value Taken Time  BP    Temp    Pulse    Resp    SpO2      Last Pain:  Vitals:   01/18/20 1714  TempSrc: Oral  PainSc: 0-No pain         Complications: No apparent anesthesia complications

## 2020-01-18 NOTE — H&P (Signed)
Date: 01/18/2020               Patient Name:  Patricia Wagner MRN: [id number]  DOB: Jan 11, 1951 Age / Sex: 69 y.o., female   PCP: Patient, No Pcp Per         Medical Service: Internal Medicine Teaching Service         Attending Physician: Dr. Aldine Contes, MD    First Contact: Dr. Court Joy Pager: [phone]  Second Contact: Dr. Sherry Ruffing Pager: [phone]       After Hours (After 5p/  First Contact Pager: [phone]  weekends / holidays): Second Contact Pager: [phone]   Chief Complaint: "Rectal bleeding"  History of Present Illness: Patricia Wagner is a 69 yo F w/ a PMHx notable for HFrEF, persistent A-fib on eliquis, HTN, nonischemic cardiomyopathy, HLD, diverticulosis with prior bleed, CKDIV, and a history of tobacco use disorder who presented for sudden onset rectal bleeding. She stated that while at home at approxi-11 PM on 01/16/2019 she felt as if she had to pass gas but when doing so passed large clotted bloody bowel movement.  Unfortunately, she continued to pass blood per rectum throughout the remainder of the night prompting her to visit the ER.  She denied weakness, dizziness, chest pain, shortness of breath, cough, visual changes, headache, fever, chills, muscle aches, joint pains, abdominal pain, constipation, dysuria, hematuria or diarrhea.  She endorses no prior symptoms or prodrome.  She passes 1-2 loose/soft bowel movements per day on average.  Denies constipation.  She endorses a prior history of GI bleed.  Of note, she has a prior history of severe biventricular dysfunction with a EF of 15% in June 2018.  She was started on amiodarone for this and was successfully cardioverted without recurrence of atrial fibrillation.  She is status post A. fib ablation in November 2020. Most recent Echo in August 2020 revealed an LVEF of 45 to 50% with severe concentric left ventricular hypertrophy.  Her amiodarone was discontinued on 2/23 by Dr. Curt Bears due to decreasing TSH levels.  In the ED the  patient hemoglobin was noted to be stable at 11.0 down from 12.23 months prior.  Patient's blood pressure, respiratory rate, heart rate and temperature were within normal limits.  Patient was seen by the [facility] gastroenterology who are planning for a nuclear medicine bleeding study given her prior history of diverticular bleed in January 2019.  Meds:  Current Outpatient Medications  Medication Instructions  . acetaminophen (TYLENOL) 650 mg, Oral, Every 4 hours PRN  . apixaban (ELIQUIS) 5 mg, Oral, 2 times daily  . furosemide (LASIX) 80 MG tablet TAKE 1 TABLET BY MOUTH EVERY DAY  . hydrALAZINE (APRESOLINE) 12.5 mg, Oral, 3 times daily  . isosorbide mononitrate (IMDUR) 30 MG 24 hr tablet TAKE HALF A TABLET BY MOUTH EVERY DAY  . pantoprazole (PROTONIX) 40 MG tablet TAKE 1 TABLET BY MOUTH EVERY DAY  . potassium chloride SA (K-DUR,KLOR-CON) 20 MEQ tablet 40 mEq, Oral, Daily  . ranolazine (RANEXA) 500 MG 12 hr tablet TAKE 1 TABLET BY MOUTH TWICE A DAY  . rosuvastatin (CRESTOR) 10 mg, Oral, Daily  . Vitamin D (Ergocalciferol) (DRISDOL) 50,000 Units, Oral, Every 7 days  . Vitamin D3 1,000 Units, Oral, Daily  . VOLTAREN 1 % GEL 1 application, Topical, 2 times daily PRN   Allergies: Allergies as of 01/18/2020 - Review Complete 01/18/2020  Allergen Reaction Noted  . Ace inhibitors Palpitations 03/20/2011   Past Medical History:  Diagnosis Date  . Acute  blood loss anemia 03/05/2018  . Acute hypercapnic respiratory failure ([place]) 04/30/2017  . Acute renal failure superimposed on chronic kidney disease ([place]) 10/20/2016  . Arthritis   . Atrial fibrillation ([place])    a. s/p multiple cardioversions; failed tikosyn/sotalol.  . Bradycardia 11/20/2016  . CAD in native artery, s/p cardiac cath with non obstructive CAD 10/24/2016  . Cerebrovascular accident (CVA) due to embolism of left cerebellar artery ([place])   . CHF (congestive heart failure) ([place])   . Chronic anticoagulation 03/15/2018  . Chronic atrial  fibrillation ([place]) 03/05/2018  . Chronic systolic (congestive) heart failure ([place]) 03/05/2018  . CKD (chronic kidney disease) stage 3, GFR 30-59 ml/min   . Coagulopathy ([place]) 03/05/2018  . DIVERTICULITIS, HX OF 07/25/2007  . Diverticulosis of colon with hemorrhage 07/22/2007   Qualifier: Diagnosis of  By: [name]    . DIVERTICULOSIS, COLON 07/22/2007  . Dyspnea   . Dysuria 07/21/2017  . Edema, peripheral    a. chronic BLE edema, R>L. Prior trauma from dog attack and accident.  . Essential hypertension 07/22/2007   Qualifier: Diagnosis of  By: [name]    . HLD (hyperlipidemia) 02/03/2008   Qualifier: Diagnosis of  By: [name], [name]   . HYPERLIPIDEMIA 02/03/2008  . Hypersomnia    declines w/u  . Hypertension   . Hypotension (arterial) 04/30/2017  . MENOPAUSAL DISORDER 01/09/2011  . Morbid obesity ([place]) 07/22/2007  . NICM (nonischemic cardiomyopathy) ([place]) 10/24/2016  . PAF (paroxysmal atrial fibrillation) ([place])   . Raynaud's syndrome 07/22/2007  . Stroke ([place]) 2017  . Suspected sleep apnea 05/20/2017  . THYROID NODULE, RIGHT 01/04/2010  . VITAMIN D DEFICIENCY 01/09/2011   Qualifier: Diagnosis of  By: [name], [name]    Family History:  Family History  Problem Relation Age of Onset  . Asthma Mother   . Diabetes Father   . Heart disease Father        Died of presumed heart attack - 24s  . Lung disease Sister   . Heart disease Sister        Twin sister has heart issue, unclear what kind  . Thyroid disease Neg Hx     Social History:  The patient endorsed smoking approximately 1/2 to 1 pack/day for 45 years.  She endorses what appears to be at least a 20 pack year history of tobacco use. She denies recent EtOH ingestion having previously consumed no more than 2-4 drinks on a weekend.  She denies daily alcohol consumption in the past. She denies illicit drug use Lives at home alone, feels that she has good support with friends  Review of Systems: A complete ROS was  negative except as per HPI.   Physical Exam: Blood pressure 116/75, pulse 81, temperature 98.3 F (36.8 C), temperature source Oral, resp. rate 16, height 5\' 5"  (1.651 m), weight 91.2 kg, SpO2 96 %. Physical Exam Constitutional:      General: She is not in acute distress.    Appearance: She is well-developed. She is not diaphoretic.  HENT:     Head: Normocephalic and atraumatic.  Eyes:     Extraocular Movements: Extraocular movements intact.     Conjunctiva/sclera: Conjunctivae normal.     Pupils: Pupils are equal, round, and reactive to light.  Cardiovascular:     Rate and Rhythm: Normal rate and regular rhythm.     Heart sounds: No murmur.  Pulmonary:     Effort: Pulmonary effort is normal. No respiratory distress.  Breath sounds: Normal breath sounds. No stridor.  Abdominal:     General: Bowel sounds are normal. There is no distension.     Palpations: Abdomen is soft.     Tenderness: There is no abdominal tenderness.  Genitourinary:    Comments: There is copious clotted bright red blood in the bed sheets the patient continues to pass through the rectum. Musculoskeletal:        General: No tenderness.     Cervical back: Normal range of motion and neck supple.     Right lower leg: No edema.     Left lower leg: No edema.  Skin:    General: Skin is warm.     Capillary Refill: Capillary refill takes less than 2 seconds.  Neurological:     General: No focal deficit present.     Mental Status: She is alert and oriented to person, place, and time.  Psychiatric:        Mood and Affect: Mood normal.        Behavior: Behavior normal.        Judgment: Judgment normal.    EKG: personally reviewed my interpretation is that this is a poor EKG study with tremendous baseline wander and artifact.  Having said this appears to be NSR  Assessment & Plan by Problem: Principal Problem:   Acute lower GI bleeding Active Problems:   Essential hypertension   Chronic combined systolic  (congestive) and diastolic (congestive) heart failure (HCC)   Acute renal failure superimposed on stage 4 chronic kidney disease (HCC)   Chronic atrial fibrillation   Diverticulosis   Chronic anticoagulation  Assessment: Patricia Wagner is a 69 yo F w/ a PMHx notable for HFrEF, persistent A-fib on eliquis, HTN, nonischemic cardiomyopathy, HLD, diverticulosis with prior bleed, CKDIV, and a history of tobacco use disorder who presented for sudden onset rectal bleeding.  Based on her history of prior diverticular bleed with similar presentation today is most likely states she is experiencing a recurrence of this.  GI is on board and planning for nuclear medicine bleeding study today with intervention pending evaluation.  She requires admission for diagnosis and treatment of life-threatening blood loss.  Plan: Acute lower GI bleed: History of diverticular bleed: Patient admitted in January 2019 for lower GI bleed requiring transfusion felt to be due to diverticular bleeding in the setting of an INR >10 as a result of being on coumadin for a prior history of CVA and a-fib. She was resumed coumadin but during a later admission that year in April she was admitted for dark tarry stool with positive hemoccult testing. She was started on Xarelto at that time due to elevated INR again >10. She again presented to the ER later that same month with recurrent BRBPR with a negative NM bleeding scan performed that admission. She also required transfusion of 2unis PRBCs. She was started on Eliquis in June 2019 by [name] due to her moderate atrial dilation on echo 03/25/17, persistent A-fib, bi-V HF w/ low EF 15% in 2018 and prior CVA in the setting of a negative tagged NM scan and likely diverticular source. Plan: -Appreciate gastroenterology's assistance -Follow-up tagged NM scan -Hold Eliquis in setting of acute life-threatening GI bleed and likely discontinue -Trend CBC every 12 hours -Patient typed and  screened -Transfuse PRBCs if hemoglobin less than 8 or persistent ongoing losses at <10  History of chronic atrial fibrillation: On chronic anticoagulation: Currently appears to be in NSR. Patient has an extensive history of  atrial fibrillation with biventricular failure.  She is followed by advanced heart therapy and is seen EP as recently as yesterday.  Per their note it appears that she was to stop amiodarone.  Additionally it appears that she is to continue Ranexa, Lasix, statin, hydralazine, and Imdur.  Patient underwent ablation on 10-13-19 and was to continue anticoagulation for minimum of 3 months with earliest stop date as a January 13, 2020 per Dr. Curt Bears note from 11/10/2019.  Will consider reaching out to cardiology to discuss as the most recent note from the 23rd does not indicate whether Eliquis should be continued. Plan: -Patient currently in normal sinus rhythm -Hold Eliquis -May be able to discontinue Eliquis given recurrent GI bleed and ablation  Acute renal failure superimposed on stage IV chronic kidney disease: Serum creatinine increased to 3.07 from 1.7 just 3 months prior.  Her baseline over the past 2 years is difficult to determine as her level ranges from approximately 1.2 to 14 but appears to be around 2.0 most recenlty.  As such her current level represents an increase which I would attribute to her GI bleed, hypotension and decreased p.o. intake in the setting of diuretic use. Plan: -Hold Lasix and antihypertensives in the setting of GI bleed and soft BP -Status post 2x500 cc NS boluses -Monitor telemetry floor maintain MAP greater than 65 -Repeat CMP in a.m.  History of chronic combined systolic and diastolic heart failure: Patient has an extensive history of heart failure for which she is followed by the advanced heart failure clinic.  Most recent LVEF looks to be improved from prior.  The echo in August 2020 does demonstrate mild left atrial dilation and impaired  relaxation of the left ventricle with severe concentric LV hypertrophy.  She does not currently appear to be in an acute exacerbation. Given her history we will only gently hydrate her and will monitor her cardiac function closely. Plan: -Gentle IV fluids -Hold Lasix in the setting diet soft BP, GI bleed and AKI -Resume Lasix, hydralazine, Ranexa and Imdur as permitted  Hypertension: Patient is currently not hypertensive.  We will hold her antihypertensives at this time and resume when tolerated.  Diet: Sips with meds Code: Full, discussed with patient at length Fluids: 1 L bolus DVT prophylaxis: SCDs, holding other due to active bleed Dispo: Admit patient to Inpatient with expected length of stay greater than 2 midnights.  Signed: Kathi Ludwig, MD 01/18/2020, 9:57 AM  [facility] Internal Medicine, PGY-3  Please see Attending A/P and/or Addendum for final recommendations.

## 2020-01-18 NOTE — Consult Note (Addendum)
[facility] Consult: 8:22 AM 01/18/2020  LOS: 0 days    Referring Provider: Dr Alvino Chapel in ED  Primary Care Physician:  Patient, No Pcp Per deviously Dr. Cathlean Cower was her physician. Primary Gastroenterologist:  Dr. Fuller Plan.      Reason for Consultation: Painless hematochezia   HPI: Patricia Wagner is a 69 y.o. female.  PMH morbid obesity.  Atrial fibrillation on Eliquis. NSR post catheter ablation 10/14/2019; plan was for 3 months uninterrupted Eliquis afterwards.  Nonischemic cardiomyopathy, EF 40 - 45%.  CVA.   Raynaud's.  CKD 3.  Hypertension.  Obstructive uropathy w chronic indwelling Foley catheter..  Anemia.  Thrombocytopenia.  Normal liver at 07/2018 ultrasound.  Osteoarthritis, plans for future knee replacement.  Presumed self-limited diverticular bleed 11/2017.  02/2011 colonoscopy with snare polypectomy of 5 mm sessile, adenomatous polyp in the sigmoid colon.  Moderate pandiverticulosis. 11/2017 colonoscopy.  Showed pandiverticulosis.  No recurrent polyps.  Passing clots of dark red blood beginning ~2300 last night.  No pain, dizziness, nausea associated with this.  Went to bed and woke up in the wee hours with recurrent hematochezia.  She is probably had about 4 or 5 episodes between home and the ED.  Still no abdominal pain or dizziness.  She feels cold.  No shortness of breath.  No tachycardia.  No use of NSAIDs Hgb 11, was 12.2 mid 09/2019, 13.3 mid 08/2019. INR 1.6 AKI versus progression of CKD.  BUN/creatinine 38/3 compared with 40/2.2 in mid 08/2019 and 27/1.7 mid 09/2019.      Past Medical History:  Diagnosis Date  . Acute blood loss anemia 03/05/2018  . Acute hypercapnic respiratory failure ([place]) 04/30/2017  . Acute renal failure superimposed on chronic kidney disease ([place]) 10/20/2016  . Arthritis    . Atrial fibrillation ([place])    a. s/p multiple cardioversions; failed tikosyn/sotalol.  . Bradycardia 11/20/2016  . CAD in native artery, s/p cardiac cath with non obstructive CAD 10/24/2016  . Cerebrovascular accident (CVA) due to embolism of left cerebellar artery ([place])   . CHF (congestive heart failure) ([place])   . Chronic anticoagulation 03/15/2018  . Chronic atrial fibrillation ([place]) 03/05/2018  . Chronic systolic (congestive) heart failure ([place]) 03/05/2018  . CKD (chronic kidney disease) stage 3, GFR 30-59 ml/min   . Coagulopathy ([place]) 03/05/2018  . DIVERTICULITIS, HX OF 07/25/2007  . Diverticulosis of colon with hemorrhage 07/22/2007   Qualifier: Diagnosis of  By: [name]    . DIVERTICULOSIS, COLON 07/22/2007  . Dyspnea   . Dysuria 07/21/2017  . Edema, peripheral    a. chronic BLE edema, R>L. Prior trauma from dog attack and accident.  . Essential hypertension 07/22/2007   Qualifier: Diagnosis of  By: [name]    . HLD (hyperlipidemia) 02/03/2008   Qualifier: Diagnosis of  By: [name], [name]   . HYPERLIPIDEMIA 02/03/2008  . Hypersomnia    declines w/u  . Hypertension   . Hypotension (arterial) 04/30/2017  . MENOPAUSAL DISORDER 01/09/2011  . Morbid obesity ([place]) 07/22/2007  . NICM (nonischemic cardiomyopathy) ([place]) 10/24/2016  .  PAF (paroxysmal atrial fibrillation) ([place])   . Raynaud's syndrome 07/22/2007  . Stroke ([place]) 2017  . Suspected sleep apnea 05/20/2017  . THYROID NODULE, RIGHT 01/04/2010  . VITAMIN D DEFICIENCY 01/09/2011   Qualifier: Diagnosis of  By: [name], [name]     Past Surgical History:  Procedure Laterality Date  . ATRIAL FIBRILLATION ABLATION N/A 10/13/2019   Procedure: ATRIAL FIBRILLATION ABLATION;  Surgeon: [name], MD;  Location: [place] [facility];  Service: Cardiovascular;  Laterality: N/A;  . CARDIAC CATHETERIZATION N/A 10/23/2016   Procedure: Left Heart Cath and Coronary Angiography;  Surgeon: [name], MD;  Location: [place] [facility];  Service: Cardiovascular;  Laterality: N/A;  . CARDIOVERSION N/A 10/31/2016   Procedure: CARDIOVERSION;  Surgeon: [name], MD;  Location: [place];  Service: Cardiovascular;  Laterality: N/A;  . CARDIOVERSION N/A 11/03/2016   Procedure: CARDIOVERSION;  Surgeon: [name], MD;  Location: [place];  Service: Cardiovascular;  Laterality: N/A;  . CARDIOVERSION N/A 11/18/2016   Procedure: CARDIOVERSION;  Surgeon: [name], MD;  Location: [place];  Service: Cardiovascular;  Laterality: N/A;  . CARDIOVERSION N/A 03/25/2017   Procedure: CARDIOVERSION;  Surgeon: [name], MD;  Location: [facility] ENDOSCOPY;  Service: Cardiovascular;  Laterality: N/A;  . CARDIOVERSION N/A 05/06/2017   Procedure: CARDIOVERSION;  Surgeon: [name], MD;  Location: [facility] ENDOSCOPY;  Service: Cardiovascular;  Laterality: N/A;  . CARDIOVERSION N/A 05/12/2017   Procedure: CARDIOVERSION;  Surgeon: [name], MD;  Location: [facility] ENDOSCOPY;  Service: Cardiovascular;  Laterality: N/A;  . COLONOSCOPY N/A 12/04/2017   Procedure: COLONOSCOPY;  Surgeon: [name], MD;  Location: [place];  Service: Endoscopy;  Laterality: N/A;  . COLONOSCOPY W/ POLYPECTOMY  02/2011   pan diverticulosis.  tubular adenoma without dysplasia on 5 mm sigmoid polyp.  Dr Fuller Plan.    Marland Kitchen PARTIAL HYSTERECTOMY     1 OVARY LEFT  . TEE WITHOUT CARDIOVERSION N/A 10/31/2016   Procedure: TRANSESOPHAGEAL ECHOCARDIOGRAM (TEE);  Surgeon: [name], MD;  Location: [facility] ENDOSCOPY;  Service: Cardiovascular;  Laterality: N/A;  . TEE WITHOUT CARDIOVERSION N/A 03/25/2017   Procedure: TRANSESOPHAGEAL ECHOCARDIOGRAM (TEE);  Surgeon: [name], MD;  Location: [facility] ENDOSCOPY;  Service: Cardiovascular;  Laterality: N/A;    Prior to Admission medications   Medication Sig Start Date End Date Taking? Authorizing Provider  acetaminophen (TYLENOL) 325 MG tablet Take 2 tablets (650 mg total) by mouth every 4 (four)  hours as needed for headache or mild pain. 10/14/19   Shirley Friar, PA-C  apixaban (ELIQUIS) 5 MG TABS tablet Take 1 tablet (5 mg total) by mouth 2 (two) times daily. 03/08/19   Larey Dresser, MD  Cholecalciferol (VITAMIN D3) 25 MCG (1000 UT) CAPS Take 1,000 Units by mouth daily.    [provider]  furosemide (LASIX) 80 MG tablet TAKE 1 TABLET BY MOUTH EVERY DAY 11/09/19   Bensimhon, Shaune Pascal, MD  hydrALAZINE (APRESOLINE) 25 MG tablet Take 0.5 tablets (12.5 mg total) by mouth 3 (three) times daily. Patient taking differently: Take 25 mg by mouth 3 (three) times daily.  07/19/19 11/14/19  Bensimhon, Shaune Pascal, MD  isosorbide mononitrate (IMDUR) 30 MG 24 hr tablet TAKE HALF A TABLET BY MOUTH EVERY DAY Patient taking differently: Take 15 mg by mouth daily.  09/07/19   Biagio Borg, MD  pantoprazole (PROTONIX) 40 MG tablet TAKE 1 TABLET BY MOUTH EVERY DAY 11/28/19   Bensimhon, Shaune Pascal, MD  potassium chloride SA (K-DUR,KLOR-CON) 20 MEQ tablet Take 2 tablets (40 mEq total) by mouth daily. Patient taking differently: Take 20 mEq by mouth daily.  03/08/19   Larey Dresser, MD  ranolazine (RANEXA) 500 MG 12 hr tablet TAKE 1 TABLET BY MOUTH TWICE A DAY 12/21/19   Larey Dresser, MD  rosuvastatin (CRESTOR) 10 MG tablet Take 1 tablet (10 mg total) by mouth daily. Patient taking differently: Take 10 mg by mouth at bedtime.  09/07/19   Biagio Borg, MD  Vitamin D, Ergocalciferol, (DRISDOL) 1.25 MG (50000 UT) CAPS capsule Take 1 capsule (50,000 Units total) by mouth every 7 (seven) days. 04/15/19   Biagio Borg, MD  VOLTAREN 1 % GEL Apply 1 application topically 2 (two) times daily as needed (Knee pain).  09/14/19   [provider]    Scheduled Meds:  Infusions:  PRN Meds:    Allergies as of 01/18/2020 - Review Complete 01/18/2020  Allergen Reaction Noted  . Ace inhibitors Palpitations 03/20/2011    Family History  Problem Relation Age of Onset  . Asthma Mother   .  Diabetes Father   . Heart disease Father        Died of presumed heart attack - 48s  . Lung disease Sister   . Heart disease Sister        Twin sister has heart issue, unclear what kind  . Thyroid disease Neg Hx     Social History   Socioeconomic History  . Marital status: Single    Spouse name: Not on file  . Number of children: 1  . Years of education: Not on file  . Highest education level: Not on file  Occupational History  . Occupation: Academic librarian   Tobacco Use  . Smoking status: Former Smoker    Packs/day: 0.50    Types: Cigarettes    Start date: 2019  . Smokeless tobacco: Never Used  Substance and Sexual Activity  . Alcohol use: No    Alcohol/week: 4.0 standard drinks    Types: 4 Cans of beer per week    Comment: Intermittent  . Drug use: No  . Sexual activity: Not on file  Other Topics Concern  . Not on file  Social History Narrative  . Not on file   Social Determinants of Health   Financial Resource Strain:   . Difficulty of Paying Living Expenses: Not on file  Food Insecurity:   . Worried About Charity fundraiser in the Last Year: Not on file  . Ran Out of Food in the Last Year: Not on file  Transportation Needs:   . Lack of Transportation (Medical): Not on file  . Lack of Transportation (Non-Medical): Not on file  Physical Activity:   . Days of Exercise per Week: Not on file  . Minutes of Exercise per Session: Not on file  Stress:   . Feeling of Stress : Not on file  Social Connections:   . Frequency of Communication with Friends and Family: Not on file  . Frequency of Social Gatherings with Friends and Family: Not on file  . Attends Religious Services: Not on file  . Active Member of Clubs or Organizations: Not on file  . Attends Archivist Meetings: Not on file  . Marital Status: Not on file  Intimate Partner Violence:   . Fear of Current or Ex-Partner: Not on file  . Emotionally Abused: Not on file  . Physically Abused: Not on file    .  Sexually Abused: Not on file    REVIEW OF SYSTEMS: Constitutional: Generally no weakness or fatigue and none currently.  Limited ambulation because of arthritis. ENT:  No nose bleeds Pulm: No shortness of breath.  No cough. CV:  No palpitations, no chest pain.  Lower extremity edema fairly chronic.  GU:  No hematuria, no frequency GI: See HPI. Heme: Generally has no issues with unusual or excessive bleeding/bruising. Transfusions: 2 PRBCs in 11/2017.  3 PRBC in 02/2018.   Neuro:  No headaches, no peripheral tingling or numbness.  No presyncope.  No dizziness. Derm:  No itching, no rash or sores.  Endocrine: Feeling chilled since the bleeding started.  No sweats.  No polyuria or dysuria Immunization: Viewed.  Up-to-date on flu shot. Travel:  None beyond local counties in last few months.    PHYSICAL EXAM: Vital signs in last 24 hours: Vitals:   01/18/20 0722 01/18/20 0722  BP:  98/67  Pulse:  80  Resp:  20  Temp:    SpO2: 94% 96%   Wt Readings from Last 3 Encounters:  01/18/20 91.2 kg  01/17/20 91.2 kg  11/14/19 100.2 kg    General: Overweight, pleasant, comfortable, alert.  Does not look ill. Head: No facial asymmetry or swelling.  No signs of head trauma. Eyes: EOMI.  No scleral icterus.  No conjunctival pallor. Ears: Minimally hard of hearing Nose: No congestion or discharge Mouth: Good dentition.  Tongue midline.  Mucosa pink, moist, clear. Neck: No JVD, no thyromegaly or masses Lungs: Clear bilaterally.  No labored breathing.  No cough. Heart: RRR.  No MRG.  S1, S2 present.  NSR in the 80s on the telemetry monitor. Abdomen: Soft.  Not tender or distended.  No HSM, masses, bruits, hernias..   Rectal: DRE not performed.  However there was a pool of burgundy blood on the pad beneath patient's upper thighs, pelvis. GU: Indwelling Foley in position. Musc/Skeltl: Some arthritic changes in the knees. Extremities: Nonpitting pedal edema. Neurologic: Oriented x3.  Moves  all 4 limbs without tremor.  Strength not tested.  No gross deficits. Skin: No rash, no sores, no purpura/bruising. Tattoos: None Nodes: No cervical adenopathy Psych: Calm, cooperative, pleasant.  Intake/Output from previous day: No intake/output data recorded. Intake/Output this shift: No intake/output data recorded.  LAB RESULTS: Recent Labs    01/18/20 0732  WBC 6.9  HGB 11.0*  HCT 37.4  PLT 269   BMET Lab Results  Component Value Date   NA 138 10/13/2019   NA 141 09/14/2019   NA 138 04/15/2019   K 4.4 10/13/2019   K 3.8 09/14/2019   K 4.1 04/15/2019   CL 103 10/13/2019   CL 96 09/14/2019   CL 96 04/15/2019   CO2 25 10/13/2019   CO2 28 09/14/2019   CO2 34 (H) 04/15/2019   GLUCOSE 131 (H) 10/13/2019   GLUCOSE 95 09/14/2019   GLUCOSE 92 04/15/2019   BUN 27 (H) 10/13/2019   BUN 40 (H) 09/14/2019   BUN 32 (H) 04/15/2019   CREATININE 1.73 (H) 10/13/2019   CREATININE 2.24 (H) 09/14/2019   CREATININE 2.09 (H) 04/15/2019   CALCIUM 8.6 (L) 10/13/2019   CALCIUM 9.8 09/14/2019   CALCIUM 9.8 04/15/2019   LFT No results for input(s): PROT, ALBUMIN, AST, ALT, ALKPHOS, BILITOT, BILIDIR, IBILI in the last 72 hours. PT/INR Lab Results  Component Value Date   INR 1.6 (H) 01/18/2020   INR 2.46 08/05/2018   INR 1.19 03/17/2018   Hepatitis Panel  No results for input(s): HEPBSAG, HCVAB, HEPAIGM, HEPBIGM in the last 72 hours. C-Diff No components found for: CDIFF Lipase     Component Value Date/Time   LIPASE 23 08/05/2018 2356    Drugs of Abuse  No results found for: LABOPIA, COCAINSCRNUR, LABBENZ, AMPHETMU, THCU, LABBARB   RADIOLOGY STUDIES: No results found.   IMPRESSION:   *  Painless LGI  Bleed.  Recurrent.  Likely diverticular bleed.    *   Hx atrial fibrillation.  Underwent successful conversion to NSR with catheter ablation on 10/13/2019.  Plan was for 3 months uninterrupted anticoagulation afterwards, i.e. could stop Eliquis on 2/19.  However a note  yesterday from Dr. Curt Bears does not mention discontinuation of Eliquis, only holding it 2 days prior to any planned operative procedures and restart ASAP post surgery.  Dr. Curt Bears did stop her amiodarone at the visit yesterday..  *   AKI.  Pre-existing CKD 3-4.    *    Obstructive uropathy, chronic indwelling Foley due for monthly change now.    PLAN:     *  Nuc med RBC bleeding scan now.  Unfortunately do not think CT angiography is feasible for this patient given her AKI.  *    Diet clear.  Per Nuc med ok to take any form of PO.    *   Hgb/hct in 6 hours.    *   Hold Eliquis.  Last dose was 01/16/2019 1 in the evening.   Azucena Freed  01/18/2020, 8:22 AM Phone [phone]

## 2020-01-18 NOTE — ED Notes (Signed)
Patient transferred to [facility]

## 2020-01-18 NOTE — H&P (View-Only) (Signed)
[facility] Consult: 8:22 AM 01/18/2020  LOS: 0 days    Referring Provider: Dr Alvino Chapel in ED  Primary Care Physician:  Patient, No Pcp Per deviously Dr. Cathlean Cower was her physician. Primary Gastroenterologist:  Dr. Fuller Plan.      Reason for Consultation: Painless hematochezia   HPI: Patricia Wagner is a 69 y.o. female.  PMH morbid obesity.  Atrial fibrillation on Eliquis. NSR post catheter ablation 10/14/2019; plan was for 3 months uninterrupted Eliquis afterwards.  Nonischemic cardiomyopathy, EF 40 - 45%.  CVA.   Raynaud's.  CKD 3.  Hypertension.  Obstructive uropathy w chronic indwelling Foley catheter..  Anemia.  Thrombocytopenia.  Normal liver at 07/2018 ultrasound.  Osteoarthritis, plans for future knee replacement.  Presumed self-limited diverticular bleed 11/2017.  02/2011 colonoscopy with snare polypectomy of 5 mm sessile, adenomatous polyp in the sigmoid colon.  Moderate pandiverticulosis. 11/2017 colonoscopy.  Showed pandiverticulosis.  No recurrent polyps.  Passing clots of dark red blood beginning ~2300 last night.  No pain, dizziness, nausea associated with this.  Went to bed and woke up in the wee hours with recurrent hematochezia.  She is probably had about 4 or 5 episodes between home and the ED.  Still no abdominal pain or dizziness.  She feels cold.  No shortness of breath.  No tachycardia.  No use of NSAIDs Hgb 11, was 12.2 mid 09/2019, 13.3 mid 08/2019. INR 1.6 AKI versus progression of CKD.  BUN/creatinine 38/3 compared with 40/2.2 in mid 08/2019 and 27/1.7 mid 09/2019.      Past Medical History:  Diagnosis Date  . Acute blood loss anemia 03/05/2018  . Acute hypercapnic respiratory failure ([place]) 04/30/2017  . Acute renal failure superimposed on chronic kidney disease ([place]) 10/20/2016  . Arthritis    . Atrial fibrillation ([place])    a. s/p multiple cardioversions; failed tikosyn/sotalol.  . Bradycardia 11/20/2016  . CAD in native artery, s/p cardiac cath with non obstructive CAD 10/24/2016  . Cerebrovascular accident (CVA) due to embolism of left cerebellar artery ([place])   . CHF (congestive heart failure) ([place])   . Chronic anticoagulation 03/15/2018  . Chronic atrial fibrillation ([place]) 03/05/2018  . Chronic systolic (congestive) heart failure ([place]) 03/05/2018  . CKD (chronic kidney disease) stage 3, GFR 30-59 ml/min   . Coagulopathy ([place]) 03/05/2018  . DIVERTICULITIS, HX OF 07/25/2007  . Diverticulosis of colon with hemorrhage 07/22/2007   Qualifier: Diagnosis of  By: [name]    . DIVERTICULOSIS, COLON 07/22/2007  . Dyspnea   . Dysuria 07/21/2017  . Edema, peripheral    a. chronic BLE edema, R>L. Prior trauma from dog attack and accident.  . Essential hypertension 07/22/2007   Qualifier: Diagnosis of  By: [name]    . HLD (hyperlipidemia) 02/03/2008   Qualifier: Diagnosis of  By: [name], [name]   . HYPERLIPIDEMIA 02/03/2008  . Hypersomnia    declines w/u  . Hypertension   . Hypotension (arterial) 04/30/2017  . MENOPAUSAL DISORDER 01/09/2011  . Morbid obesity ([place]) 07/22/2007  . NICM (nonischemic cardiomyopathy) ([place]) 10/24/2016  .  PAF (paroxysmal atrial fibrillation) ([place])   . Raynaud's syndrome 07/22/2007  . Stroke ([place]) 2017  . Suspected sleep apnea 05/20/2017  . THYROID NODULE, RIGHT 01/04/2010  . VITAMIN D DEFICIENCY 01/09/2011   Qualifier: Diagnosis of  By: [name], [name]     Past Surgical History:  Procedure Laterality Date  . ATRIAL FIBRILLATION ABLATION N/A 10/13/2019   Procedure: ATRIAL FIBRILLATION ABLATION;  Surgeon: [name], MD;  Location: [facility];  Service: Cardiovascular;  Laterality: N/A;  . CARDIAC CATHETERIZATION N/A 10/23/2016   Procedure: Left Heart Cath and Coronary Angiography;  Surgeon: [name], MD;  Location: [facility];  Service: Cardiovascular;  Laterality: N/A;  . CARDIOVERSION N/A 10/31/2016   Procedure: CARDIOVERSION;  Surgeon: [name], MD;  Location: [facility];  Service: Cardiovascular;  Laterality: N/A;  . CARDIOVERSION N/A 11/03/2016   Procedure: CARDIOVERSION;  Surgeon: [name], MD;  Location: [facility];  Service: Cardiovascular;  Laterality: N/A;  . CARDIOVERSION N/A 11/18/2016   Procedure: CARDIOVERSION;  Surgeon: [name], MD;  Location: [facility];  Service: Cardiovascular;  Laterality: N/A;  . CARDIOVERSION N/A 03/25/2017   Procedure: CARDIOVERSION;  Surgeon: [name], MD;  Location: [facility] ENDOSCOPY;  Service: Cardiovascular;  Laterality: N/A;  . CARDIOVERSION N/A 05/06/2017   Procedure: CARDIOVERSION;  Surgeon: [name], MD;  Location: [facility] ENDOSCOPY;  Service: Cardiovascular;  Laterality: N/A;  . CARDIOVERSION N/A 05/12/2017   Procedure: CARDIOVERSION;  Surgeon: [name], MD;  Location: [facility] ENDOSCOPY;  Service: Cardiovascular;  Laterality: N/A;  . COLONOSCOPY N/A 12/04/2017   Procedure: COLONOSCOPY;  Surgeon: [name], MD;  Location: [facility];  Service: Endoscopy;  Laterality: N/A;  . COLONOSCOPY W/ POLYPECTOMY  02/2011   pan diverticulosis.  tubular adenoma without dysplasia on 5 mm sigmoid polyp.  Dr Fuller Plan.    Marland Kitchen PARTIAL HYSTERECTOMY     1 OVARY LEFT  . TEE WITHOUT CARDIOVERSION N/A 10/31/2016   Procedure: TRANSESOPHAGEAL ECHOCARDIOGRAM (TEE);  Surgeon: [name], MD;  Location: [facility] ENDOSCOPY;  Service: Cardiovascular;  Laterality: N/A;  . TEE WITHOUT CARDIOVERSION N/A 03/25/2017   Procedure: TRANSESOPHAGEAL ECHOCARDIOGRAM (TEE);  Surgeon: [name], MD;  Location: [facility] ENDOSCOPY;  Service: Cardiovascular;  Laterality: N/A;    Prior to Admission medications   Medication Sig Start Date End Date Taking? Authorizing Provider  acetaminophen (TYLENOL) 325 MG tablet Take 2 tablets (650 mg total) by mouth every 4 (four)  hours as needed for headache or mild pain. 10/14/19   Shirley Friar, PA-C  apixaban (ELIQUIS) 5 MG TABS tablet Take 1 tablet (5 mg total) by mouth 2 (two) times daily. 03/08/19   Larey Dresser, MD  Cholecalciferol (VITAMIN D3) 25 MCG (1000 UT) CAPS Take 1,000 Units by mouth daily.    [provider]  furosemide (LASIX) 80 MG tablet TAKE 1 TABLET BY MOUTH EVERY DAY 11/09/19   Bensimhon, Shaune Pascal, MD  hydrALAZINE (APRESOLINE) 25 MG tablet Take 0.5 tablets (12.5 mg total) by mouth 3 (three) times daily. Patient taking differently: Take 25 mg by mouth 3 (three) times daily.  07/19/19 11/14/19  Bensimhon, Shaune Pascal, MD  isosorbide mononitrate (IMDUR) 30 MG 24 hr tablet TAKE HALF A TABLET BY MOUTH EVERY DAY Patient taking differently: Take 15 mg by mouth daily.  09/07/19   Biagio Borg, MD  pantoprazole (PROTONIX) 40 MG tablet TAKE 1 TABLET BY MOUTH EVERY DAY 11/28/19   Bensimhon, Shaune Pascal, MD  potassium chloride SA (K-DUR,KLOR-CON) 20 MEQ tablet Take 2 tablets (40 mEq total) by mouth daily. Patient taking differently: Take 20 mEq by mouth daily.  03/08/19   Larey Dresser, MD  ranolazine (RANEXA) 500 MG 12 hr tablet TAKE 1 TABLET BY MOUTH TWICE A DAY 12/21/19   Larey Dresser, MD  rosuvastatin (CRESTOR) 10 MG tablet Take 1 tablet (10 mg total) by mouth daily. Patient taking differently: Take 10 mg by mouth at bedtime.  09/07/19   Biagio Borg, MD  Vitamin D, Ergocalciferol, (DRISDOL) 1.25 MG (50000 UT) CAPS capsule Take 1 capsule (50,000 Units total) by mouth every 7 (seven) days. 04/15/19   Biagio Borg, MD  VOLTAREN 1 % GEL Apply 1 application topically 2 (two) times daily as needed (Knee pain).  09/14/19   [provider]    Scheduled Meds:  Infusions:  PRN Meds:    Allergies as of 01/18/2020 - Review Complete 01/18/2020  Allergen Reaction Noted  . Ace inhibitors Palpitations 03/20/2011    Family History  Problem Relation Age of Onset  . Asthma Mother   .  Diabetes Father   . Heart disease Father        Died of presumed heart attack - 41s  . Lung disease Sister   . Heart disease Sister        Twin sister has heart issue, unclear what kind  . Thyroid disease Neg Hx     Social History   Socioeconomic History  . Marital status: Single    Spouse name: Not on file  . Number of children: 1  . Years of education: Not on file  . Highest education level: Not on file  Occupational History  . Occupation: Academic librarian   Tobacco Use  . Smoking status: Former Smoker    Packs/day: 0.50    Types: Cigarettes    Start date: 2019  . Smokeless tobacco: Never Used  Substance and Sexual Activity  . Alcohol use: No    Alcohol/week: 4.0 standard drinks    Types: 4 Cans of beer per week    Comment: Intermittent  . Drug use: No  . Sexual activity: Not on file  Other Topics Concern  . Not on file  Social History Narrative  . Not on file   Social Determinants of Health   Financial Resource Strain:   . Difficulty of Paying Living Expenses: Not on file  Food Insecurity:   . Worried About Charity fundraiser in the Last Year: Not on file  . Ran Out of Food in the Last Year: Not on file  Transportation Needs:   . Lack of Transportation (Medical): Not on file  . Lack of Transportation (Non-Medical): Not on file  Physical Activity:   . Days of Exercise per Week: Not on file  . Minutes of Exercise per Session: Not on file  Stress:   . Feeling of Stress : Not on file  Social Connections:   . Frequency of Communication with Friends and Family: Not on file  . Frequency of Social Gatherings with Friends and Family: Not on file  . Attends Religious Services: Not on file  . Active Member of Clubs or Organizations: Not on file  . Attends Archivist Meetings: Not on file  . Marital Status: Not on file  Intimate Partner Violence:   . Fear of Current or Ex-Partner: Not on file  . Emotionally Abused: Not on file  . Physically Abused: Not on file    .  Sexually Abused: Not on file    REVIEW OF SYSTEMS: Constitutional: Generally no weakness or fatigue and none currently.  Limited ambulation because of arthritis. ENT:  No nose bleeds Pulm: No shortness of breath.  No cough. CV:  No palpitations, no chest pain.  Lower extremity edema fairly chronic.  GU:  No hematuria, no frequency GI: See HPI. Heme: Generally has no issues with unusual or excessive bleeding/bruising. Transfusions: 2 PRBCs in 11/2017.  3 PRBC in 02/2018.   Neuro:  No headaches, no peripheral tingling or numbness.  No presyncope.  No dizziness. Derm:  No itching, no rash or sores.  Endocrine: Feeling chilled since the bleeding started.  No sweats.  No polyuria or dysuria Immunization: Viewed.  Up-to-date on flu shot. Travel:  None beyond local counties in last few months.    PHYSICAL EXAM: Vital signs in last 24 hours: Vitals:   01/18/20 0722 01/18/20 0722  BP:  98/67  Pulse:  80  Resp:  20  Temp:    SpO2: 94% 96%   Wt Readings from Last 3 Encounters:  01/18/20 91.2 kg  01/17/20 91.2 kg  11/14/19 100.2 kg    General: Overweight, pleasant, comfortable, alert.  Does not look ill. Head: No facial asymmetry or swelling.  No signs of head trauma. Eyes: EOMI.  No scleral icterus.  No conjunctival pallor. Ears: Minimally hard of hearing Nose: No congestion or discharge Mouth: Good dentition.  Tongue midline.  Mucosa pink, moist, clear. Neck: No JVD, no thyromegaly or masses Lungs: Clear bilaterally.  No labored breathing.  No cough. Heart: RRR.  No MRG.  S1, S2 present.  NSR in the 80s on the telemetry monitor. Abdomen: Soft.  Not tender or distended.  No HSM, masses, bruits, hernias..   Rectal: DRE not performed.  However there was a pool of burgundy blood on the pad beneath patient's upper thighs, pelvis. GU: Indwelling Foley in position. Musc/Skeltl: Some arthritic changes in the knees. Extremities: Nonpitting pedal edema. Neurologic: Oriented x3.  Moves  all 4 limbs without tremor.  Strength not tested.  No gross deficits. Skin: No rash, no sores, no purpura/bruising. Tattoos: None Nodes: No cervical adenopathy Psych: Calm, cooperative, pleasant.  Intake/Output from previous day: No intake/output data recorded. Intake/Output this shift: No intake/output data recorded.  LAB RESULTS: Recent Labs    01/18/20 0732  WBC 6.9  HGB 11.0*  HCT 37.4  PLT 269   BMET Lab Results  Component Value Date   NA 138 10/13/2019   NA 141 09/14/2019   NA 138 04/15/2019   K 4.4 10/13/2019   K 3.8 09/14/2019   K 4.1 04/15/2019   CL 103 10/13/2019   CL 96 09/14/2019   CL 96 04/15/2019   CO2 25 10/13/2019   CO2 28 09/14/2019   CO2 34 (H) 04/15/2019   GLUCOSE 131 (H) 10/13/2019   GLUCOSE 95 09/14/2019   GLUCOSE 92 04/15/2019   BUN 27 (H) 10/13/2019   BUN 40 (H) 09/14/2019   BUN 32 (H) 04/15/2019   CREATININE 1.73 (H) 10/13/2019   CREATININE 2.24 (H) 09/14/2019   CREATININE 2.09 (H) 04/15/2019   CALCIUM 8.6 (L) 10/13/2019   CALCIUM 9.8 09/14/2019   CALCIUM 9.8 04/15/2019   LFT No results for input(s): PROT, ALBUMIN, AST, ALT, ALKPHOS, BILITOT, BILIDIR, IBILI in the last 72 hours. PT/INR Lab Results  Component Value Date   INR 1.6 (H) 01/18/2020   INR 2.46 08/05/2018   INR 1.19 03/17/2018   Hepatitis Panel  No results for input(s): HEPBSAG, HCVAB, HEPAIGM, HEPBIGM in the last 72 hours. C-Diff No components found for: CDIFF Lipase     Component Value Date/Time   LIPASE 23 08/05/2018 2356    Drugs of Abuse  No results found for: LABOPIA, COCAINSCRNUR, LABBENZ, AMPHETMU, THCU, LABBARB   RADIOLOGY STUDIES: No results found.   IMPRESSION:   *  Painless LGI  Bleed.  Recurrent.  Likely diverticular bleed.    *   Hx atrial fibrillation.  Underwent successful conversion to NSR with catheter ablation on 10/13/2019.  Plan was for 3 months uninterrupted anticoagulation afterwards, i.e. could stop Eliquis on 2/19.  However a note  yesterday from Dr. Curt Bears does not mention discontinuation of Eliquis, only holding it 2 days prior to any planned operative procedures and restart ASAP post surgery.  Dr. Curt Bears did stop her amiodarone at the visit yesterday..  *   AKI.  Pre-existing CKD 3-4.    *    Obstructive uropathy, chronic indwelling Foley due for monthly change now.    PLAN:     *  Nuc med RBC bleeding scan now.  Unfortunately do not think CT angiography is feasible for this patient given her AKI.  *    Diet clear.  Per Nuc med ok to take any form of PO.    *   Hgb/hct in 6 hours.    *   Hold Eliquis.  Last dose was 01/16/2019 1 in the evening.   Azucena Freed  01/18/2020, 8:22 AM Phone [phone]

## 2020-01-18 NOTE — Op Note (Signed)
[facility] Patient Name: Patricia Wagner Procedure Date : 01/18/2020 MRN: [id number] Attending MD: Gerrit Heck , MD Date of Birth: October 07, 1951 CSN: [id number] Age: 69 Admit Type: Inpatient Procedure:                Flexible Sigmoidoscopy Indications:              Hematochezia, Acute post hemorrhagic anemia,                            POsitive tagged RBC scan                           69 yo female admitted with painless hematochezia in                            the setting of Eliquis for AFib and AKI on CKD.                            Tagged RBC scan positive for bleeding in distal                            left colon. Providers:                Gerrit Heck, MD, Jeanella Cara, RN,                            Elspeth Cho Tech., Technician, Raphael Gibney,                            CRNA, Kary Kos, RN Referring MD:              Medicines:                Monitored Anesthesia Care Complications:            No immediate complications. Estimated Blood Loss:     Estimated blood loss was minimal. Procedure:                Pre-Anesthesia Assessment:                           - Prior to the procedure, a History and Physical                            was performed, and patient medications and                            allergies were reviewed. The patient's tolerance of                            previous anesthesia was also reviewed. The risks                            and benefits of the procedure and the sedation                            options and risks  were discussed with the patient.                            All questions were answered, and informed consent                            was obtained. Prior Anticoagulants: The patient has                            taken Eliquis (apixaban), last dose was 1 day prior                            to procedure. ASA Grade Assessment: III - A patient                            with severe systemic disease.  After reviewing the                            risks and benefits, the patient was deemed in                            satisfactory condition to undergo the procedure.                           After obtaining informed consent, the scope was                            passed under direct vision. The PCF-H190DL                            ([id number] Olympus pediatric colonoscope was                            introduced through the anus and advanced to the the                            sigmoid colon. The flexible sigmoidoscopy was                            accomplished without difficulty. The patient                            tolerated the procedure well. The quality of the                            bowel preparation was adequate. Findings:      Multiple small and large-mouthed diverticula were found in the sigmoid       colon. There was bright red blood and blood clots in the colon,       consistent with active, recent bleeding. There was a superficial clot       with oozing in one of the diverticula, located 35 cm from the anal       verge. Clot removed with water irrigation with active, brisk bleeding.  For hemostasis, two hemostatic clips were successfully placed (MR       conditional). Area was successfully injected with 5.5 mL of a 1:10,000       solution of epinephrine for hemostasis with appropriate mucosal       blanching. There was no bleeding at the conclusion of the study. The       remainder of the examined colon was without additional sites of bleeding       or high grade stigmata of recent bleeding.      Red blood and clots were found in the rectum, in the recto-sigmoid colon       and in the sigmoid colon.      Non-bleeding internal hemorrhoids were found during anoscopy. The       hemorrhoids were small. Retroflexion in the rectum was not performed due       to narrowed rectal vault. Impression:               - Diverticulosis in the sigmoid colon with active,                             brisk bleeding. This was successfully treated with                            hemostatic clips x2 and Epinephrine injection with                            cessation of bleeding.                           - Blood in the rectum, in the recto-sigmoid colon                            and in the sigmoid colon.                           - Non-bleeding internal hemorrhoids.                           - No specimens collected. Recommendation:           - Return patient to hospital ward for ongoing care.                           - Clear liquid diet today.                           - Continue Eliquis hold. Discuss with Cardiology                            whether this needs to be restarted at all given                            prior cardiac ablation in 09/2019.                           - Continue serial Hgb/Hct with blood products per  protocol. Given degree of bleeding, expect to see                            some decline in Hgb.                           - Expect to see continued hematochezia overnight                            and tomorrow due to continued luminal clearing of                            blood. WIll use hemodynamics to guage recurrent                            active bleeding vs luminal clearance of old blood. Procedure Code(s):        --- Professional ---                           [id number], Sigmoidoscopy, flexible; with control of                            bleeding, any method Diagnosis Code(s):        --- Professional ---                           K64.8, Other hemorrhoids                           K62.5, Hemorrhage of anus and rectum                           K92.2, Gastrointestinal hemorrhage, unspecified                           K92.1, Melena (includes Hematochezia)                           K57.30, Diverticulosis of large intestine without                            perforation or abscess without bleeding CPT copyright  2019 American Medical [employer]. All rights reserved. The codes documented in this report are preliminary and upon coder review may  be revised to meet current compliance requirements. Gerrit Heck, MD 01/18/2020 5:58:49 PM Number of Addenda: 0

## 2020-01-18 NOTE — ED Provider Notes (Signed)
[facility] EMERGENCY DEPARTMENT Provider Note   CSN: [id number] Arrival date & time: 01/18/20  0715     History Chief Complaint  Patient presents with  . Rectal Bleeding    EDELL Wagner is a 69 y.o. female.  HPI Patient presents with blood in the stool.  States began last night.  States has had around 4 episodes of dark red blood rectally.  States she is had a before where it was a large amount states she has had a fair amount of blood today.  States she feels pretty good otherwise.  No lightheaded or dizziness.  She is on Eliquis for chronic atrial fibrillation.  No abdominal pain.  No rectal pain.  No chest pain or trouble breathing.    Past Medical History:  Diagnosis Date  . Acute blood loss anemia 03/05/2018  . Acute hypercapnic respiratory failure ([place]) 04/30/2017  . Acute renal failure superimposed on chronic kidney disease ([place]) 10/20/2016  . Arthritis   . Atrial fibrillation ([place])    a. s/p multiple cardioversions; failed tikosyn/sotalol.  . Bradycardia 11/20/2016  . CAD in native artery, s/p cardiac cath with non obstructive CAD 10/24/2016  . Cerebrovascular accident (CVA) due to embolism of left cerebellar artery ([place])   . CHF (congestive heart failure) ([place])   . Chronic anticoagulation 03/15/2018  . Chronic atrial fibrillation ([place]) 03/05/2018  . Chronic systolic (congestive) heart failure ([place]) 03/05/2018  . CKD (chronic kidney disease) stage 3, GFR 30-59 ml/min   . Coagulopathy ([place]) 03/05/2018  . DIVERTICULITIS, HX OF 07/25/2007  . Diverticulosis of colon with hemorrhage 07/22/2007   Qualifier: Diagnosis of  By: [name]    . DIVERTICULOSIS, COLON 07/22/2007  . Dyspnea   . Dysuria 07/21/2017  . Edema, peripheral    a. chronic BLE edema, R>L. Prior trauma from dog attack and accident.  . Essential hypertension 07/22/2007   Qualifier: Diagnosis of  By: [name]    . HLD (hyperlipidemia) 02/03/2008   Qualifier: Diagnosis of  By: [name]  MD, [name]   . HYPERLIPIDEMIA 02/03/2008  . Hypersomnia    declines w/u  . Hypertension   . Hypotension (arterial) 04/30/2017  . MENOPAUSAL DISORDER 01/09/2011  . Morbid obesity ([place]) 07/22/2007  . NICM (nonischemic cardiomyopathy) ([place]) 10/24/2016  . PAF (paroxysmal atrial fibrillation) ([place])   . Raynaud's syndrome 07/22/2007  . Stroke ([place]) 2017  . Suspected sleep apnea 05/20/2017  . THYROID NODULE, RIGHT 01/04/2010  . VITAMIN D DEFICIENCY 01/09/2011   Qualifier: Diagnosis of  By: [name], [name]     Patient Active Problem List   Diagnosis Date Noted  . Acquired thrombophilia ([place]) 11/14/2019  . Persistent atrial fibrillation ([place]) 10/13/2019  . Allergic rhinitis 09/10/2019  . Left ear pain 09/10/2019  . Vitamin D deficiency 04/15/2019  . Cardiogenic shock ([place])   . Acute on chronic respiratory failure with hypoxia ([place])   . Elevated troponin 08/05/2018  . Hyperbilirubinemia 08/05/2018  . Abnormal transaminases 08/05/2018  . Dysarthria 08/05/2018  . Lower GI bleed 03/15/2018  . Chronic anticoagulation 03/15/2018  . Rectal bleed   . Acute renal failure superimposed on stage 4 chronic kidney disease ([place]) 03/05/2018  . Coagulopathy ([place]) 03/05/2018  . Chronic systolic (congestive) heart failure ([place]) 03/05/2018  . Chronic atrial fibrillation 03/05/2018  . Acute blood loss anemia 03/05/2018  . Diverticulosis 03/05/2018  . Heme positive stool 03/05/2018  . Urine abnormality 02/02/2018  . Cough 02/02/2018  . Bacteremia due to Gram-negative bacteria  12/30/2017  . Increased anion gap metabolic acidosis 34/19/6222  . Metabolic alkalosis 97/98/9211  . Acute UTI 12/29/2017  . Obstructive uropathy   . Anemia 12/17/2017  . Diarrhea 11/30/2017  . Urinary retention 11/23/2017  . Lower abdominal pain 11/23/2017  . Dysuria 07/21/2017  . Suspected sleep apnea 05/20/2017  . Pressure injury of skin 05/02/2017  . Acute respiratory failure with hypoxia ([place]) 04/30/2017  . Hypotension  (arterial) 04/30/2017  . Bradycardia 11/20/2016  . Acute on chronic combined systolic and diastolic CHF (congestive heart failure) ([place]) 11/20/2016  . Cerebrovascular accident (CVA) due to embolism of left cerebellar artery ([place])   . PAF (paroxysmal atrial fibrillation) ([place])   . CAD in native artery, s/p cardiac cath with non obstructive CAD 10/24/2016  . NICM (nonischemic cardiomyopathy) ([place]) 10/24/2016  . Tobacco abuse 10/20/2016  . Acute renal failure superimposed on chronic kidney disease ([place]) 10/20/2016  . Hematochezia 06/29/2013  . Degenerative arthritis of left knee 03/15/2012  . Encounter for well adult exam with abnormal findings 03/12/2012  . Hypersomnia   . MENOPAUSAL DISORDER 01/09/2011  . THYROID NODULE, RIGHT 01/04/2010  . HLD (hyperlipidemia) 02/03/2008  . LEG CRAMPS, NOCTURNAL 02/03/2008  . LOW BACK PAIN 07/25/2007  . Morbid obesity ([place]) 07/22/2007  . Essential hypertension 07/22/2007  . Raynaud's syndrome 07/22/2007  . Diverticulosis of colon with hemorrhage 07/22/2007  . SYMPTOM, EDEMA 07/22/2007    Past Surgical History:  Procedure Laterality Date  . ATRIAL FIBRILLATION ABLATION N/A 10/13/2019   Procedure: ATRIAL FIBRILLATION ABLATION;  Surgeon: [name], MD;  Location: [facility];  Service: Cardiovascular;  Laterality: N/A;  . CARDIAC CATHETERIZATION N/A 10/23/2016   Procedure: Left Heart Cath and Coronary Angiography;  Surgeon: [name], MD;  Location: [facility];  Service: Cardiovascular;  Laterality: N/A;  . CARDIOVERSION N/A 10/31/2016   Procedure: CARDIOVERSION;  Surgeon: [name], MD;  Location: [facility];  Service: Cardiovascular;  Laterality: N/A;  . CARDIOVERSION N/A 11/03/2016   Procedure: CARDIOVERSION;  Surgeon: [name], MD;  Location: [facility];  Service: Cardiovascular;  Laterality: N/A;  . CARDIOVERSION N/A 11/18/2016   Procedure: CARDIOVERSION;  Surgeon: [name], MD;  Location: [facility];  Service: Cardiovascular;  Laterality: N/A;  . CARDIOVERSION N/A 03/25/2017   Procedure: CARDIOVERSION;  Surgeon: [name], MD;  Location[facility] ENDOSCOPY;  Service: Cardiovascular;  Laterality: N/A;  . CARDIOVERSION N/A 05/06/2017   Procedure: CARDIOVERSION;  Surgeon: [name], MD;  Location: [facility] ENDOSCOPY;  Service: Cardiovascular;  Laterality: N/A;  . CARDIOVERSION N/A 05/12/2017   Procedure: CARDIOVERSION;  Surgeon: [name], MD;  Location: [facility] ENDOSCOPY;  Service: Cardiovascular;  Laterality: N/A;  . COLONOSCOPY N/A 12/04/2017   Procedure: COLONOSCOPY;  Surgeon: [name], MD;  Location: [facility];  Service: Endoscopy;  Laterality: N/A;  . COLONOSCOPY W/ POLYPECTOMY  02/2011   pan diverticulosis.  tubular adenoma without dysplasia on 5 mm sigmoid polyp.  Dr Fuller Plan.    Marland Kitchen PARTIAL HYSTERECTOMY     1 OVARY LEFT  . TEE WITHOUT CARDIOVERSION N/A 10/31/2016   Procedure: TRANSESOPHAGEAL ECHOCARDIOGRAM (TEE);  Surgeon: [name], MD;  Location: [facility] ENDOSCOPY;  Service: Cardiovascular;  Laterality: N/A;  . TEE WITHOUT CARDIOVERSION N/A 03/25/2017   Procedure: TRANSESOPHAGEAL ECHOCARDIOGRAM (TEE);  Surgeon: [name], MD;  Location: [facility] ENDOSCOPY;  Service: Cardiovascular;  Laterality: N/A;     OB History   No obstetric history on file.     Family  History  Problem Relation Age of Onset  . Asthma Mother   . Diabetes Father   . Heart disease Father        Died of presumed heart attack - 32s  . Lung disease Sister   . Heart disease Sister        Twin sister has heart issue, unclear what kind  . Thyroid disease Neg Hx     Social History   Tobacco Use  . Smoking status: Former Smoker    Packs/day: 0.50    Types: Cigarettes    Start date: 2019  . Smokeless tobacco: Never Used  Substance Use Topics  . Alcohol use: No    Alcohol/week: 4.0 standard drinks    Types: 4 Cans of beer per week    Comment: Intermittent  . Drug use: No    Home  Medications Prior to Admission medications   Medication Sig Start Date End Date Taking? Authorizing Provider  acetaminophen (TYLENOL) 325 MG tablet Take 2 tablets (650 mg total) by mouth every 4 (four) hours as needed for headache or mild pain. 10/14/19   Shirley Friar, PA-C  apixaban (ELIQUIS) 5 MG TABS tablet Take 1 tablet (5 mg total) by mouth 2 (two) times daily. 03/08/19   Larey Dresser, MD  Cholecalciferol (VITAMIN D3) 25 MCG (1000 UT) CAPS Take 1,000 Units by mouth daily.    [provider]  furosemide (LASIX) 80 MG tablet TAKE 1 TABLET BY MOUTH EVERY DAY 11/09/19   Bensimhon, Shaune Pascal, MD  hydrALAZINE (APRESOLINE) 25 MG tablet Take 0.5 tablets (12.5 mg total) by mouth 3 (three) times daily. Patient taking differently: Take 25 mg by mouth 3 (three) times daily.  07/19/19 11/14/19  Bensimhon, Shaune Pascal, MD  isosorbide mononitrate (IMDUR) 30 MG 24 hr tablet TAKE HALF A TABLET BY MOUTH EVERY DAY Patient taking differently: Take 15 mg by mouth daily.  09/07/19   Biagio Borg, MD  pantoprazole (PROTONIX) 40 MG tablet TAKE 1 TABLET BY MOUTH EVERY DAY 11/28/19   Bensimhon, Shaune Pascal, MD  potassium chloride SA (K-DUR,KLOR-CON) 20 MEQ tablet Take 2 tablets (40 mEq total) by mouth daily. Patient taking differently: Take 20 mEq by mouth daily.  03/08/19   Larey Dresser, MD  ranolazine (RANEXA) 500 MG 12 hr tablet TAKE 1 TABLET BY MOUTH TWICE A DAY 12/21/19   Larey Dresser, MD  rosuvastatin (CRESTOR) 10 MG tablet Take 1 tablet (10 mg total) by mouth daily. Patient taking differently: Take 10 mg by mouth at bedtime.  09/07/19   Biagio Borg, MD  Vitamin D, Ergocalciferol, (DRISDOL) 1.25 MG (50000 UT) CAPS capsule Take 1 capsule (50,000 Units total) by mouth every 7 (seven) days. 04/15/19   Biagio Borg, MD  VOLTAREN 1 % GEL Apply 1 application topically 2 (two) times daily as needed (Knee pain).  09/14/19   [provider]    Allergies    Ace inhibitors  Review of  Systems   Review of Systems  Constitutional: Negative for appetite change.  HENT: Negative for congestion.   Respiratory: Negative for shortness of breath.   Cardiovascular: Negative for chest pain.  Gastrointestinal: Positive for blood in stool. Negative for abdominal pain.  Genitourinary: Negative for flank pain.  Musculoskeletal: Negative for back pain.  Skin: Negative for rash.  Neurological: Negative for weakness.  Psychiatric/Behavioral: Negative for confusion.    Physical Exam Updated Vital Signs BP 98/67 (BP Location: Right Arm)   Pulse 80  Temp 98.3 F (36.8 C) (Oral)   Resp 20   Ht 5\' 5"  (1.651 m)   Wt 91.2 kg   SpO2 96%   BMI 33.45 kg/m   Physical Exam Vitals and nursing note reviewed.  HENT:     Head: Normocephalic.  Eyes:     Pupils: Pupils are equal, round, and reactive to light.  Cardiovascular:     Rate and Rhythm: Normal rate.  Pulmonary:     Breath sounds: Normal breath sounds.  Abdominal:     Tenderness: There is no abdominal tenderness.  Genitourinary:    Comments: Gross blood in the rectal area.  No hemorrhoid seen Musculoskeletal:        General: No tenderness.  Skin:    General: Skin is warm.     Capillary Refill: Capillary refill takes less than 2 seconds.     Coloration: Skin is not pale.  Neurological:     Mental Status: She is alert. Mental status is at baseline.     ED Results / Procedures / Treatments   Labs (all labs ordered are listed, but only abnormal results are displayed) Labs Reviewed  COMPREHENSIVE METABOLIC PANEL - Abnormal; Notable for the following components:      Result Value   Glucose, Bld 100 (*)    BUN 38 (*)    Creatinine, Ser 3.07 (*)    Albumin 2.8 (*)    GFR calc non Af Amer 15 (*)    GFR calc Af Amer 18 (*)    All other components within normal limits  CBC WITH DIFFERENTIAL/PLATELET - Abnormal; Notable for the following components:   Hemoglobin 11.0 (*)    MCHC 29.4 (*)    Abs Immature Granulocytes  0.11 (*)    All other components within normal limits  PROTIME-INR - Abnormal; Notable for the following components:   Prothrombin Time 18.7 (*)    INR 1.6 (*)    All other components within normal limits  TYPE AND SCREEN    EKG EKG Interpretation  Date/Time:  Wednesday January 18 2020 07:39:32 EST Ventricular Rate:  75 PR Interval:    QRS Duration: 135 QT Interval:  433 QTC Calculation: 484 R Axis:   88 Text Interpretation: Sinus rhythm Consider left atrial enlargement Probable left ventricular hypertrophy Artifact in lead(s) I II III aVR aVL aVF V1 V2 Confirmed by Davonna Belling [phone]) on 01/18/2020 8:25:21 AM   Radiology No results found.  Procedures Procedures (including critical care time)  Medications Ordered in ED Medications  sodium chloride 0.9 % bolus 500 mL (500 mLs Intravenous New Bag/Given 01/18/20 0813)    ED Course  I have reviewed the triage vital signs and the nursing notes.  Pertinent labs & imaging results that were available during my care of the patient were reviewed by me and considered in my medical decision making (see chart for details).    MDM Rules/Calculators/A&P                      Patient presents for likely lower GI bleeding.  Red stool.  History of presumed diverticular bleed a couple years ago.  She is on Eliquis for A. fib and had last dose last night.  She has however had an ablation is in a sinus rhythm.  Can be reversed if needed.  Initial blood pressure was 98, however without intervention rechecked and was in the 1 teens.  Not orthostatic although had difficulty standing due to pain in her  knee.  Have discussed with [facility], who will see patient.  Patient did not have a PCP.  Will discuss with unassigned medicine.  Kidney function is worsened after creatinine of 3 with baseline of 1.7  CRITICAL CARE Performed by: Davonna Belling Total critical care time: 30 minutes Critical care time was exclusive of separately billable  procedures and treating other patients. Critical care was necessary to treat or prevent imminent or life-threatening deterioration. Critical care was time spent personally by me on the following activities: development of treatment plan with patient and/or surrogate as well as nursing, discussions with consultants, evaluation of patient's response to treatment, examination of patient, obtaining history from patient or surrogate, ordering and performing treatments and interventions, ordering and review of laboratory studies, ordering and review of radiographic studies, pulse oximetry and re-evaluation of patient's condition.  Final Clinical Impression(s) / ED Diagnoses Final diagnoses:  Lower GI bleed  AKI (acute kidney injury) [facility])    Rx / [place] Orders ED Discharge Orders    None       Davonna Belling, MD 01/18/20 [phone]

## 2020-01-18 NOTE — Progress Notes (Signed)
nuc RBC scan positive for bleeding in LLQ, descending or sigmoid region  Hgb down fronm 11 to 8.8.   Pt feels ok but episodes of bleeding during this afternoon.    Plan is Flex sig this afternoon.    S Clare Fennimore PA-C

## 2020-01-18 NOTE — ED Triage Notes (Signed)
Pt arrives by EMS with complaints of dark red blood with clots in her stool since 2300 01/17/20. Pt states she has a history of bleed. Pt alert and oriented X4

## 2020-01-18 NOTE — Interval H&P Note (Signed)
History and Physical Interval Note:  01/18/2020 5:10 PM  Patricia Wagner  has presented today for surgery, with the diagnosis of lower gi bleeding.  The various methods of treatment have been discussed with the patient and family. After consideration of risks, benefits and other options for treatment, the patient has consented to  Procedure(s): FLEXIBLE SIGMOIDOSCOPY (N/A) as a surgical intervention.  The patient's history has been reviewed, patient examined, no change in status, stable for surgery.  I have reviewed the patient's chart and labs.  Questions were answered to the patient's satisfaction.     Dominic Pea Kendale Rembold

## 2020-01-18 NOTE — Anesthesia Preprocedure Evaluation (Signed)
Anesthesia Evaluation  Patient identified by MRN, date of birth, ID band Patient awake    Reviewed: Allergy & Precautions, NPO status , Patient's Chart, lab work & pertinent test results  Airway Mallampati: I  TM Distance: >3 FB Neck ROM: Full    Dental   Pulmonary former smoker,    Pulmonary exam normal        Cardiovascular hypertension, + CAD  Normal cardiovascular exam+ dysrhythmias Atrial Fibrillation      Neuro/Psych CVA    GI/Hepatic   Endo/Other    Renal/GU Renal InsufficiencyRenal disease     Musculoskeletal   Abdominal   Peds  Hematology   Anesthesia Other Findings   Reproductive/Obstetrics                             Anesthesia Physical Anesthesia Plan  ASA: III  Anesthesia Plan: MAC   Post-op Pain Management:    Induction: Intravenous  PONV Risk Score and Plan: 2 and Midazolam and Ondansetron  Airway Management Planned: Nasal Cannula  Additional Equipment:   Intra-op Plan:   Post-operative Plan:   Informed Consent: I have reviewed the patients History and Physical, chart, labs and discussed the procedure including the risks, benefits and alternatives for the proposed anesthesia with the patient or authorized representative who has indicated his/her understanding and acceptance.       Plan Discussed with: CRNA and Surgeon  Anesthesia Plan Comments:         Anesthesia Quick Evaluation

## 2020-01-19 ENCOUNTER — Other Ambulatory Visit: Payer: Self-pay | Admitting: Physician Assistant

## 2020-01-19 ENCOUNTER — Encounter: Payer: Self-pay | Admitting: Cardiology

## 2020-01-19 DIAGNOSIS — D62 Acute posthemorrhagic anemia: Secondary | ICD-10-CM

## 2020-01-19 DIAGNOSIS — E785 Hyperlipidemia, unspecified: Secondary | ICD-10-CM

## 2020-01-19 DIAGNOSIS — K5731 Diverticulosis of large intestine without perforation or abscess with bleeding: Principal | ICD-10-CM

## 2020-01-19 DIAGNOSIS — Z960 Presence of urogenital implants: Secondary | ICD-10-CM

## 2020-01-19 DIAGNOSIS — R339 Retention of urine, unspecified: Secondary | ICD-10-CM

## 2020-01-19 DIAGNOSIS — Z79899 Other long term (current) drug therapy: Secondary | ICD-10-CM

## 2020-01-19 DIAGNOSIS — I5042 Chronic combined systolic (congestive) and diastolic (congestive) heart failure: Secondary | ICD-10-CM

## 2020-01-19 DIAGNOSIS — I4819 Other persistent atrial fibrillation: Secondary | ICD-10-CM

## 2020-01-19 DIAGNOSIS — Z720 Tobacco use: Secondary | ICD-10-CM

## 2020-01-19 DIAGNOSIS — I255 Ischemic cardiomyopathy: Secondary | ICD-10-CM

## 2020-01-19 DIAGNOSIS — Z9889 Other specified postprocedural states: Secondary | ICD-10-CM

## 2020-01-19 DIAGNOSIS — I130 Hypertensive heart and chronic kidney disease with heart failure and stage 1 through stage 4 chronic kidney disease, or unspecified chronic kidney disease: Secondary | ICD-10-CM

## 2020-01-19 DIAGNOSIS — N184 Chronic kidney disease, stage 4 (severe): Secondary | ICD-10-CM

## 2020-01-19 LAB — CBC
HCT: 24.8 % — ABNORMAL LOW (ref 36.0–46.0)
HCT: 30.2 % — ABNORMAL LOW (ref 36.0–46.0)
Hemoglobin: 7.6 g/dL — ABNORMAL LOW (ref 12.0–15.0)
Hemoglobin: 9.3 g/dL — ABNORMAL LOW (ref 12.0–15.0)
MCH: 29.2 pg (ref 26.0–34.0)
MCH: 28.8 pg (ref 26.0–34.0)
MCHC: 30.6 g/dL (ref 30.0–36.0)
MCHC: 30.8 g/dL (ref 30.0–36.0)
MCV: 95.4 fL (ref 80.0–100.0)
MCV: 93.5 fL (ref 80.0–100.0)
Platelets: 203 10*3/uL (ref 150–400)
Platelets: 238 10*3/uL (ref 150–400)
RBC: 2.6 MIL/uL — ABNORMAL LOW (ref 3.87–5.11)
RBC: 3.23 MIL/uL — ABNORMAL LOW (ref 3.87–5.11)
RDW: 15.3 % (ref 11.5–15.5)
RDW: 15.6 % — ABNORMAL HIGH (ref 11.5–15.5)
WBC: 5.4 10*3/uL (ref 4.0–10.5)
WBC: 7.8 10*3/uL (ref 4.0–10.5)
nRBC: 0 % (ref 0.0–0.2)
nRBC: 0.4 % — ABNORMAL HIGH (ref 0.0–0.2)

## 2020-01-19 LAB — COMPREHENSIVE METABOLIC PANEL
ALT: 9 U/L (ref 0–44)
AST: 11 U/L — ABNORMAL LOW (ref 15–41)
Albumin: 2.2 g/dL — ABNORMAL LOW (ref 3.5–5.0)
Alkaline Phosphatase: 35 U/L — ABNORMAL LOW (ref 38–126)
Anion gap: 10 (ref 5–15)
BUN: 32 mg/dL — ABNORMAL HIGH (ref 8–23)
CO2: 25 mmol/L (ref 22–32)
Calcium: 8.3 mg/dL — ABNORMAL LOW (ref 8.9–10.3)
Chloride: 103 mmol/L (ref 98–111)
Creatinine, Ser: 2.28 mg/dL — ABNORMAL HIGH (ref 0.44–1.00)
GFR calc Af Amer: 25 mL/min — ABNORMAL LOW (ref >60)
GFR calc non Af Amer: 22 mL/min — ABNORMAL LOW (ref >60)
Glucose, Bld: 84 mg/dL (ref 70–99)
Potassium: 3.1 mmol/L — ABNORMAL LOW (ref 3.5–5.1)
Sodium: 138 mmol/L (ref 135–145)
Total Bilirubin: 1 mg/dL (ref 0.3–1.2)
Total Protein: 5 g/dL — ABNORMAL LOW (ref 6.5–8.1)

## 2020-01-19 LAB — PREPARE RBC (CROSSMATCH)

## 2020-01-19 IMAGING — US US RENAL
1 series · 14 of 25 positions shown · non-contrast
Comparison: Renal ultrasound 03/05/2018. CT of the abdomen pelvis
12/02/2017.

CLINICAL DATA: Hydronephrosis.

EXAM:
RENAL / URINARY TRACT ULTRASOUND COMPLETE

[Series 1: us renal · 0.22mm/px · 14 of 48 slices shown]
[im 1/48]
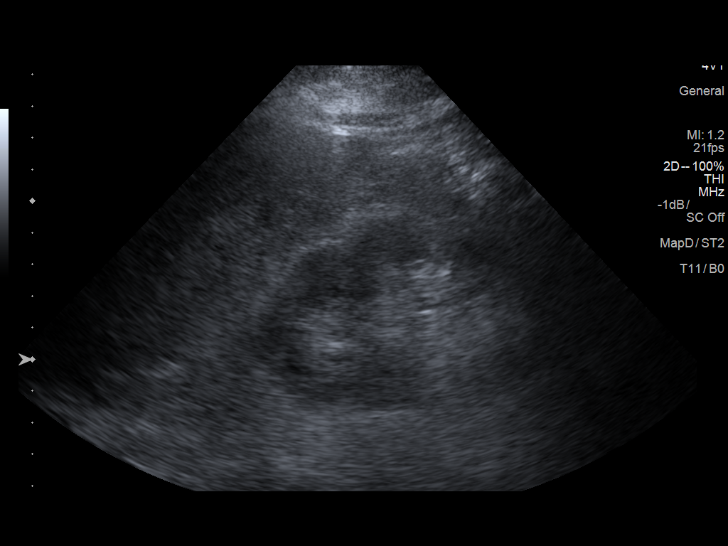
[im 4/48]
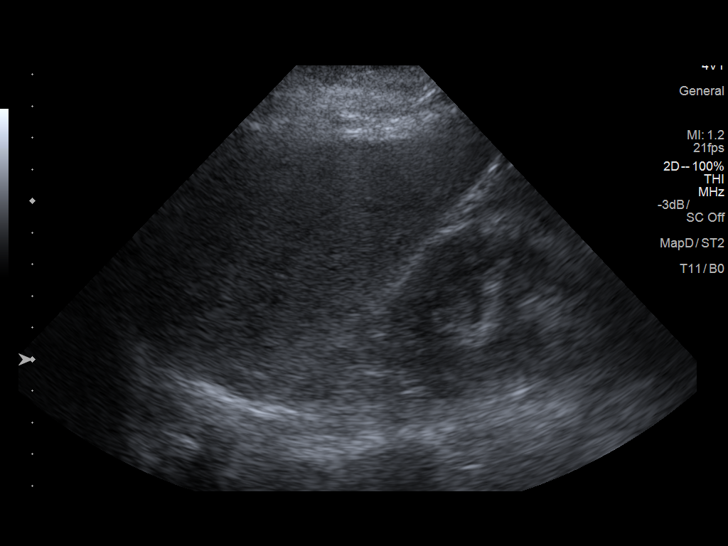
[im 8/48]
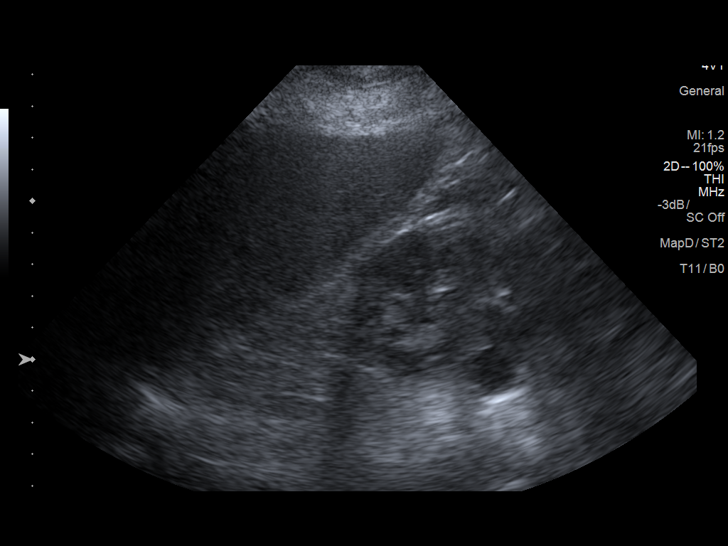
[im 12/48]
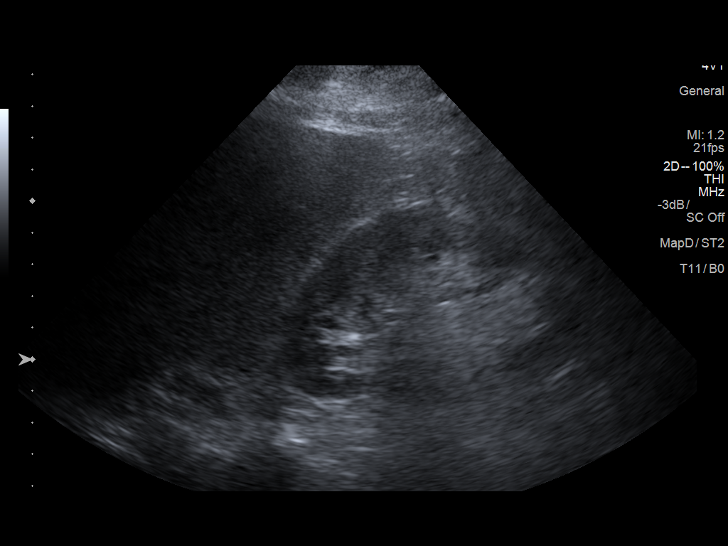
[im 16/48]
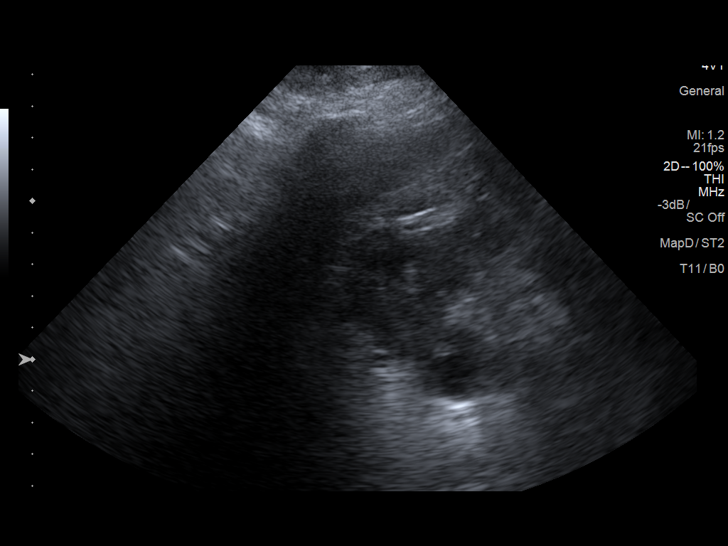
[im 18/48]
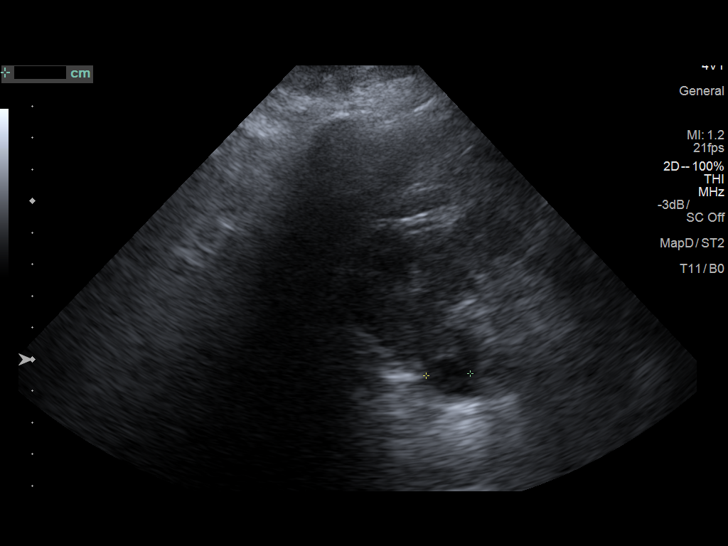
[im 22/48]
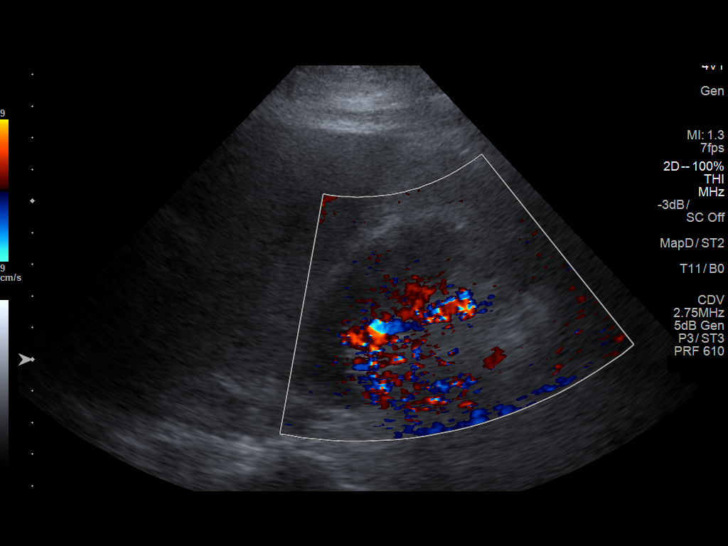
[im 26/48]
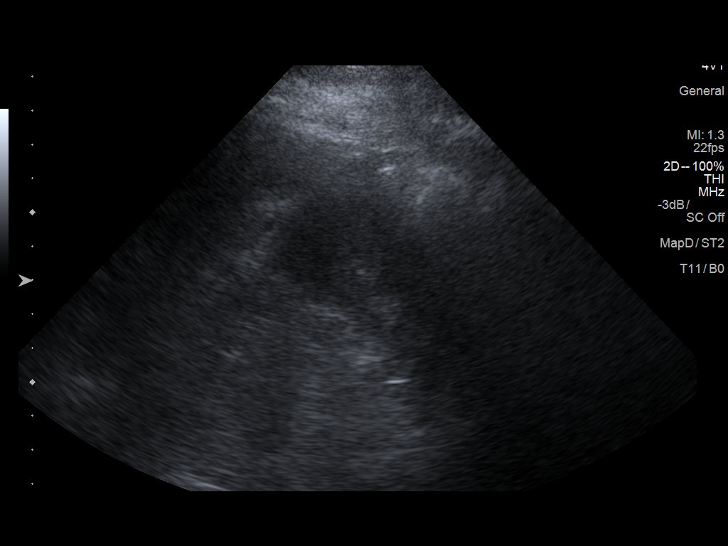
[im 30/48]
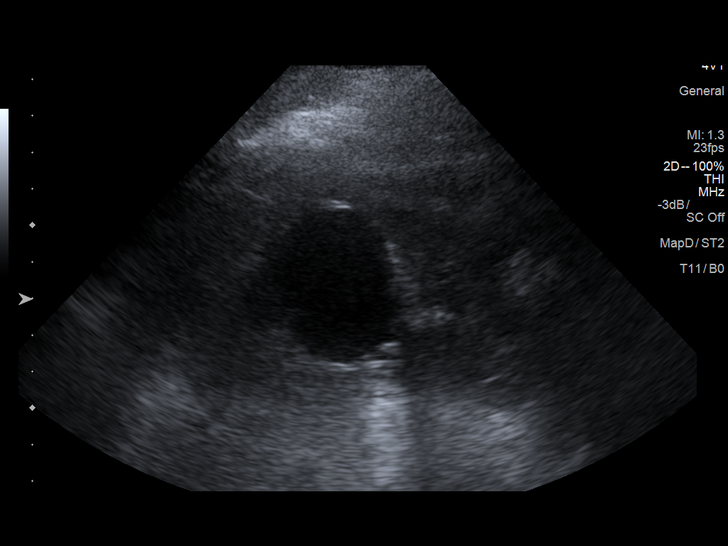
[im 32/48]
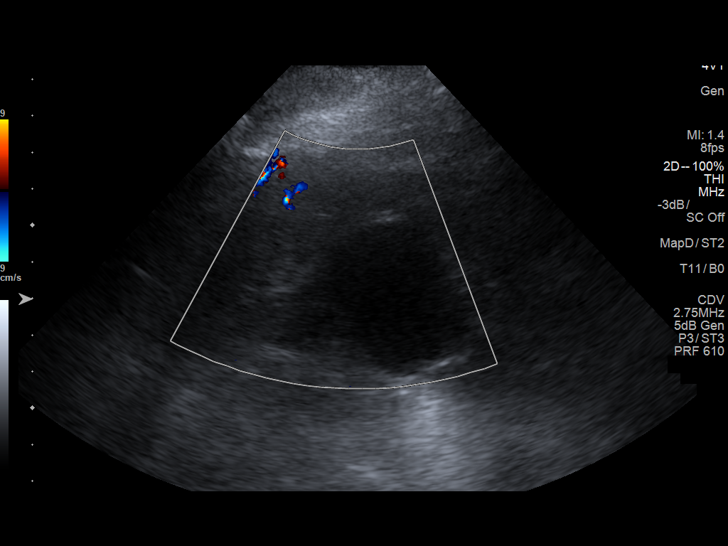
[im 36/48]
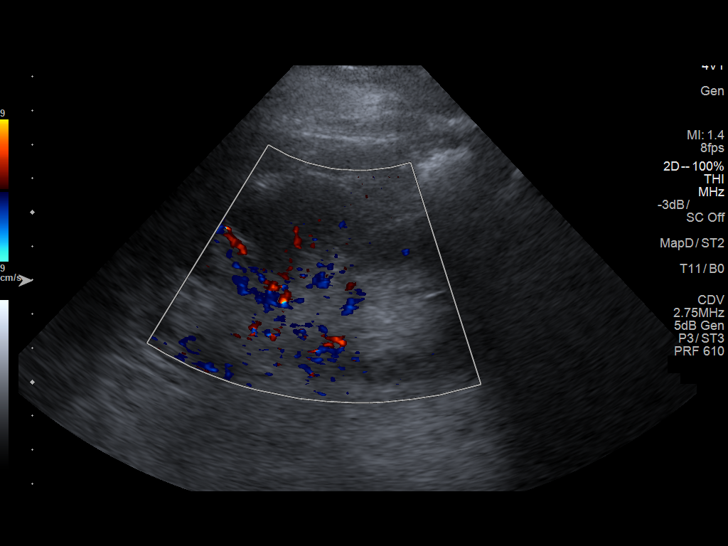
[im 40/48]
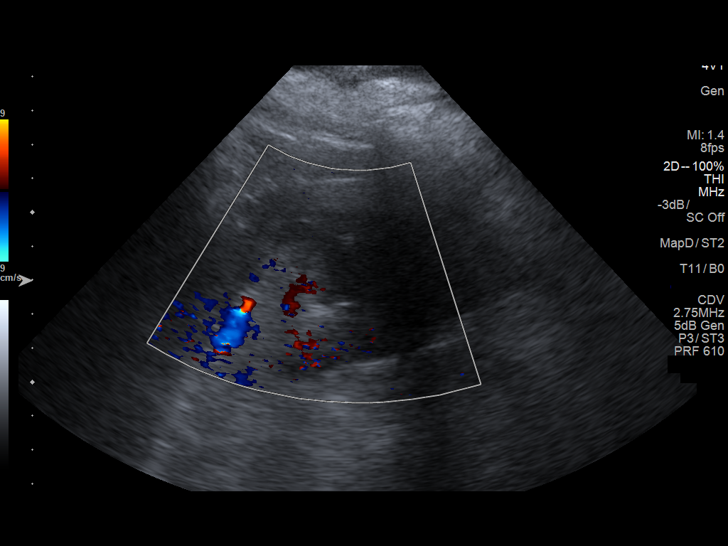
[im 44/48]
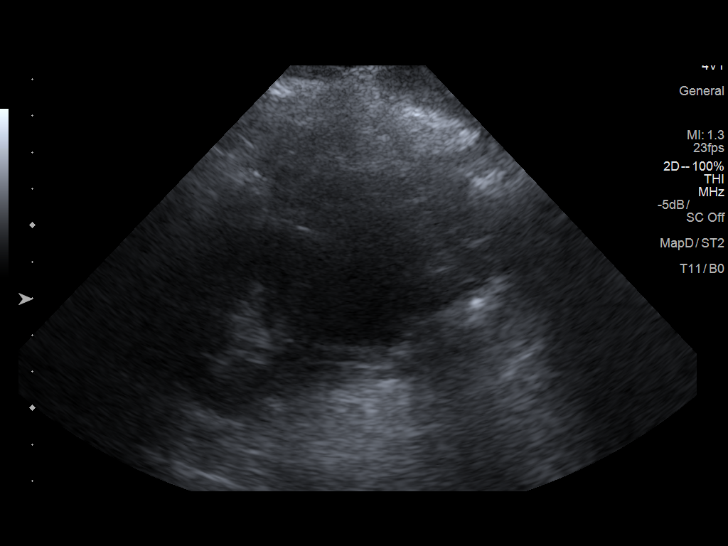
[im 48/48]
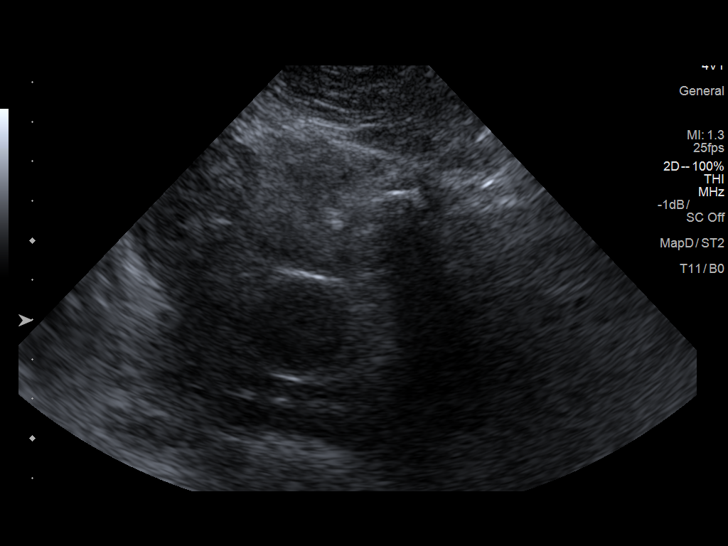

[14 of 25 positions shown; findings below may reference images not displayed]

FINDINGS: Right Kidney:

Length: 10.3 cm, within normal limits. This is decreased from the
prior exam.. Hydronephrosis has resolved. A cyst at the lower pole
the right kidney measures 1.6 cm maximally. No other focal
parenchymal lesions are present.

Left Kidney:

Length: 10.6 cm, within normal limits.. Hydronephrosis has resolved.
A cyst at the lower pole measures 4.2 cm maximally. No other
parenchymal lesions are present.

Bladder:

A Foley catheter is in place.
IMPRESSION: 1. Interval resolution of bilateral hydronephrosis.
2. Foley catheter in place.
3. Bilateral renal cysts.

## 2020-01-19 MED ORDER — POTASSIUM CHLORIDE CRYS ER 20 MEQ PO TBCR
40.0000 meq | EXTENDED_RELEASE_TABLET | Freq: Two times a day (BID) | ORAL | Status: AC
  Administered 2020-01-19 (×2): 40 meq via ORAL
  Filled 2020-01-19 (×2): qty 2

## 2020-01-19 MED ORDER — SODIUM CHLORIDE 0.9% IV SOLUTION: Freq: Once | INTRAVENOUS | Status: AC

## 2020-01-19 MED ORDER — DICLOFENAC SODIUM 1 % EX GEL
4.0000 g | Freq: Three times a day (TID) | CUTANEOUS | Status: DC
  Administered 2020-01-19 – 2020-01-20 (×6): 4 g via TOPICAL
  Filled 2020-01-19: qty 100

## 2020-01-19 MED ORDER — CHLORHEXIDINE GLUCONATE CLOTH 2 % EX PADS
6.0000 | MEDICATED_PAD | Freq: Every day | CUTANEOUS | Status: DC
  Administered 2020-01-19 – 2020-01-20 (×2): 6 via TOPICAL

## 2020-01-19 NOTE — Progress Notes (Signed)
  Date: 01/19/2020  Patient name: Patricia Wagner  Medical record number: [id number]  Date of birth: 06-Apr-1951   I have seen and evaluated [name] and discussed their care with the Residency Team.  In brief, patient is a 69 year old female with a past medical history of chronic systolic heart failure, A. fib on Eliquis, hypertension, nonischemic cardiomyopathy, hyperlipidemia, diverticulosis with prior bleed, CKD stage IV who presented to the ED with bright red blood per rectum x1 day.  Patient states that on the day prior to her admission she developed crampy abdominal pain and had a large bloody bowel movement.  Patient had multiple bloody bowel movements throughout the night and came to the ED for further evaluation.  She denied any lightheadedness or syncope.  No fevers or chills, no shortness of breath, no nausea or vomiting, no headache, no blurry vision, no focal weakness, no tingling or numbness.  Today patient states that she feels well and has not had a bowel movement since her procedure yesterday.  PMHx, Fam Hx, and/or Soc Hx : As per resident admit note  Vitals:   01/19/20 1215 01/19/20 1242  BP: 108/67 (!) 100/56  Pulse: 76 78  Resp: 19 19  Temp: 98.3 F (36.8 C) 98.6 F (37 C)  SpO2: 95% 93%   General: Awake, alert, oriented x3, NAD CVS: Regular rate and rhythm, normal heart sounds Lungs: CTA bilaterally Abdomen: Soft, nontender, nondistended, normoactive bowel sounds Extremities: No edema noted, not tender to palpation HEENT: Normocephalic, atraumatic Psych: Normal mood and affect Skin: Warm and dry  Assessment and Plan: I have seen and evaluated the patient as outlined above. I agree with the formulated Assessment and Plan as detailed in the residents' note, with the following changes:   1.  Acute blood loss anemia secondary to lower GI bleed: -Patient presented to the ED with multiple episodes of bright red blood per rectum and passing of clots.  Patient was  noted to have borderline blood pressures with SBP's in the 90s on admission which improved with IV fluids.  Patient had a nuclear medicine bleeding study which showed active bleed and her left lower quadrant from the sigmoid or descending colon. -Patient was taken for flex sigmoidoscopy yesterday by GI and is found to have active diverticular bleeding status post clip placement and epinephrine injection with resolution of the bleed. -Patient's hemoglobin has continued to decrease and is down to 7.6 today.  She will receive 1 unit PRBC today. -Patient's systolic BPs have remained in the 100s and she has not been tachycardic.  She also denies any orthostatic symptoms today (no lightheadedness on sitting up). -GI follow-up recommendations appreciated.  Okay to advance diet.  Will need to discuss with cardiology the need for long-term Eliquis given her A. fib ablation in November.  Will follow up with cardiology for this. -We will continue to monitor CBC closely.  No further work-up at this time. -Of note, patient did have AKI on CKD with creatinine increasing to 3 from a baseline of of approximately 2.  Patient's creatinine is improved to 2.2 today.  We will continue to monitor closely. -If patient's hemoglobin remained stable and she has no further episodes of bleeding she would likely be stable for DC home tomorrow.  Aldine Contes, MD 2/25/20211:44 PM

## 2020-01-19 NOTE — Telephone Encounter (Signed)
Dr. Court Joy with Internal Medicine at [facility] states the patient has been experiencing gastrointestinal bleeding and he is requesting to discuss whether or not Eliquis medication is to be held. Please return call and advise at [phone].

## 2020-01-19 NOTE — Progress Notes (Signed)
Subjective:   Patient notes feeling well this morning. She has not had a bowel movement yet but does not report any further bleeding. She reports that she has had some gas. Currently, denies any shortness of breath, lightheadedness or ongoing abdominal pain. Let patient know she may pass some clots or blood from her previous bleeding. She should keep nursing informed of bowel movements and inform nursing  immediately if she expects active bleeding.    Objective:  Vital signs in last 24 hours: Vitals:   01/18/20 1800 01/18/20 1832 01/18/20 1944 01/19/20 0027  BP: (!) 116/48 109/61 111/62 (!) 107/54  Pulse:  70 71 74  Resp:      Temp:  98.7 F (37.1 C) 98.3 F (36.8 C) 98.8 F (37.1 C)  TempSrc:  Oral Oral Oral  SpO2: 93% 91% 91% 90%  Weight:      Height:       Gen: NAD, normal appearing CV: Regular rate and rhythm, no murmur , rubs, or gallops Pulmonary: CTAB, no wheezes, rales , or rhonchi Abd: Bowel sounds present, no tenderness to palpation  CBC Latest Ref Rng & Units 01/19/2020 01/18/2020 01/18/2020  WBC 4.0 - 10.5 K/uL 5.4 6.6 -  Hemoglobin 12.0 - 15.0 g/dL 7.6(L) 8.1(L) 8.8(L)  Hematocrit 36.0 - 46.0 % 24.8(L) 26.3(L) 28.7(L)  Platelets 150 - 400 K/uL 203 219 -     Assessment/Plan:  Principal Problem:   Acute lower GI bleeding Active Problems:   Essential hypertension   Chronic combined systolic (congestive) and diastolic (congestive) heart failure (HCC)   AKI (acute kidney injury) ([place])   Acute renal failure superimposed on stage 4 chronic kidney disease (HCC)   Chronic atrial fibrillation   Diverticulosis   Chronic anticoagulation   Gergen is a 69 year old female with a past medical history notable for tobacco use disorder, HLD, HTN, HFrEF, persistent A. fib on Eliquis, nonischemic cardiomyopathy, CKD stage IV, and diverticulosis with prior bleed who presented with diverticular bleed in sigmoid colon. Patient is now s/p hemostatic clips x2 and epinephrine with  cessation of bleeding.   #Diverticular bleed RBC scan positive for bleeding in the distal colon.  Flexible sigmoidoscopy performed with 2x hemostatic clips and epinephrine. Patient has had no bleeding overnight. Denies any symptoms of anemia.  Plan to continue to monitor today and recheck CBC this afternoon.  Hemoglobin 7.6, down from 8.1 post procedure.    P: -Greatly appreciate GI consult, follow recommendations -Trend CBC -Monitor for hematochezia -Continue holding Eliquis. Following up with EP Cardiolgy to discuss plan for DOAC going forward  - Will transfuse 1 unit PRBCs with Hgb in setting of CHF - Transfuse for hemoglobin less than 8 or persistent ongoing losses at <10 - Advance diet to heart healthy  AKI on CKD stage IV Baseline creatinine 1.7-2.  Serum creatinine on admission 3.07.  Improving to 2.28 today. P: - Trend with increase PO intake  Chronic Foley Patient has hx of urniary retention with chronic indwelling Foley.  P: - Exchange foley  #History of chronic combined systolic and diastolic heart failure: 11/7492 Echo demonstrated MR EF , EF 45-50% and impaired relaxation of left ventricle with severe concentric LV hypertrophy.  P: Holding lasix in setting of of AKI, appeared Euvolumic to hypovolemic on exam  - Continue Ranexa and Imdur - Hold Hydralazine.   #HTN - Normotensive .  P: -Holding hydralazine  .  Prior to Admission Living Arrangement:home Anticipated Discharge Location:home Barriers to Discharge:  giving blood transfusion and  monitoring overnight for risk of bleeding Dispo: Anticipated discharge in approximately 1-2 day(s).   Tamsen Snider, MD PGY1

## 2020-01-19 NOTE — Progress Notes (Signed)
Patient Status: [facility] - In-pt  Assessment and Plan: GI bleed  Patient with BRBPR on admission.  NM Study positive for lower GI bleed.  IR consulted for possible angiogram with intervention.  Underwent flex sig overnight.  Bleeding diverticula was identified and clipped by Dr. Bryan Lemma.   VSS.  Hgb 7.6, not unexpected.  INR 1.6 Eliquis on hold.  BM this AM without BRB.  Patient appears stable at this time.  IR available as needed.  ______________________________________________________________________   History of Present Illness: Patricia Wagner is a 70 y.o. female with history of a fib on Eliquis, CHF, diverticulosis admitted with rectal bleeding.  Patient found to have lower GI bleed. IR consulted for possible angiogram with intervention if needed. Taken for colonoscopy overnight with clipping of briskly bleeding diverticula.  Hemostasis achieved.   Allergies and medications reviewed.   Review of Systems: A 12 point ROS discussed and pertinent positives are indicated in the HPI above.  All other systems are negative.  Review of Systems  Constitutional: Negative for fatigue and fever.  Respiratory: Negative for cough and shortness of breath.   Cardiovascular: Negative for chest pain.  Gastrointestinal: Positive for abdominal pain and blood in stool. Negative for nausea and vomiting.  Genitourinary: Negative for dysuria.  Musculoskeletal: Negative for back pain.  Neurological: Negative for headaches.  Psychiatric/Behavioral: Negative for behavioral problems and confusion.    Vital Signs: BP (!) 109/58 (BP Location: Left Arm)   Pulse 68   Temp 98.5 F (36.9 C) (Oral)   Resp 18   Ht 5\' 5"  (1.651 m)   Wt 201 lb (91.2 kg)   SpO2 90%   BMI 33.45 kg/m   Physical Exam Vitals and nursing note reviewed.  Constitutional:      Appearance: Normal appearance.  HENT:     Mouth/Throat:     Mouth: Mucous membranes are moist.     Pharynx: Oropharynx is clear.    Cardiovascular:     Rate and Rhythm: Normal rate and regular rhythm.  Pulmonary:     Effort: Pulmonary effort is normal. No respiratory distress.     Breath sounds: Normal breath sounds.  Abdominal:     General: Abdomen is flat. There is no distension.     Palpations: Abdomen is soft.     Tenderness: There is no abdominal tenderness.  Skin:    General: Skin is warm and dry.  Neurological:     General: No focal deficit present.     Mental Status: She is alert and oriented to person, place, and time.  Psychiatric:        Mood and Affect: Mood normal.        Behavior: Behavior normal.        Thought Content: Thought content normal.        Judgment: Judgment normal.      Imaging reviewed.   Labs:  COAGS: Recent Labs    01/18/20 0732  INR 1.6*    BMP: Recent Labs    09/14/19 1421 10/13/19 1708 01/18/20 0732 01/19/20 0228  NA 141 138 138 138  K 3.8 4.4 3.7 3.1*  CL 96 103 99 103  CO2 28 25 25 25   GLUCOSE 95 131* 100* 84  BUN 40* 27* 38* 32*  CALCIUM 9.8 8.6* 9.3 8.3*  CREATININE 2.24* 1.73* 3.07* 2.28*  GFRNONAA 22* 30* 15* 22*  GFRAA 26* 35* 18* 25*       Electronically Signed: Docia Barrier, PA 01/19/2020, 11:55 AM  I spent a total of 15 minutes in face to face in clinical consultation, greater than 50% of which was counseling/coordinating care for GI bleed.

## 2020-01-19 NOTE — Anesthesia Postprocedure Evaluation (Signed)
Anesthesia Post Note  Patient: Patricia Wagner  Procedure(s) Performed: FLEXIBLE SIGMOIDOSCOPY (N/A ) [place] SCLEROTHERAPY     Patient location during evaluation: PACU Anesthesia Type: MAC Level of consciousness: awake and alert Pain management: pain level controlled Vital Signs Assessment: post-procedure vital signs reviewed and stable Respiratory status: spontaneous breathing, nonlabored ventilation, respiratory function stable and patient connected to nasal cannula oxygen Cardiovascular status: stable and blood pressure returned to baseline Postop Assessment: no apparent nausea or vomiting Anesthetic complications: no    Last Vitals:  Vitals:   01/19/20 0027 01/19/20 0753  BP: (!) 107/54 (!) 109/58  Pulse: 74 68  Resp:  18  Temp: 37.1 C 36.9 C  SpO2: 90% 90%    Last Pain:  Vitals:   01/19/20 0753  TempSrc: Oral  PainSc:                  Shawnn Bouillon DAVID

## 2020-01-19 NOTE — Progress Notes (Signed)
Daily Rounding Note  01/19/2020, 11:37 AM  LOS: 1 day   SUBJECTIVE:   Chief complaint: Large volume hematochezia.     Dark, old bloody stool this morning.  She feels great.  Not moving a lot but that has to do with her balance problems related to knee osteoarthritis.  No dizziness, chest pain, palpitations, abdominal pain, nausea.  Wondering when she is going to get real food.  OBJECTIVE:         Vital signs in last 24 hours:    Temp:  [97.5 F (36.4 C)-98.8 F (37.1 C)] 98.5 F (36.9 C) (02/25 0753) Pulse Rate:  [68-75] 68 (02/25 0753) Resp:  [18-22] 18 (02/25 0753) BP: (107-134)/(46-65) 109/58 (02/25 0753) SpO2:  [90 %-98 %] 90 % (02/25 0753) Weight:  [91.2 kg] 91.2 kg (02/24 1714) Last BM Date: 01/18/20 Filed Weights   01/18/20 0719 01/18/20 1714  Weight: 91.2 kg 91.2 kg   General: Looks well.  Alert.  Comfortable. Heart: RRR. Chest: Clear bilaterally.  No labored breathing or cough. Abdomen: Obese, soft, not tender or distended.  Active bowel sounds. Extremities: Nonpitting pedal edema.. Neuro/Psych: Alert.  Oriented x3.  No gross deficits.  No tremor.  In good spirits.  Intake/Output from previous day: 02/24 0701 - 02/25 0700 In: 1040 [P.O.:240; I.V.:300; IV Piggyback:500] Out: 200 [Urine:200]  Intake/Output this shift: No intake/output data recorded.  Lab Results: Recent Labs    01/18/20 0732 01/18/20 0732 01/18/20 1537 01/18/20 1913 01/19/20 0228  WBC 6.9  --   --  6.6 5.4  HGB 11.0*   < > 8.8* 8.1* 7.6*  HCT 37.4   < > 28.7* 26.3* 24.8*  PLT 269  --   --  219 203   < > = values in this interval not displayed.   BMET Recent Labs    01/18/20 0732 01/19/20 0228  NA 138 138  K 3.7 3.1*  CL 99 103  CO2 25 25  GLUCOSE 100* 84  BUN 38* 32*  CREATININE 3.07* 2.28*  CALCIUM 9.3 8.3*   LFT Recent Labs    01/18/20 0732 01/19/20 0228  PROT 6.6 5.0*  ALBUMIN 2.8* 2.2*  AST 16 11*  ALT  8 9  ALKPHOS 47 35*  BILITOT 0.7 1.0   PT/INR Recent Labs    01/18/20 0732  LABPROT 18.7*  INR 1.6*   Hepatitis Panel No results for input(s): HEPBSAG, HCVAB, HEPAIGM, HEPBIGM in the last 72 hours.  Studies/Results: NM GI Blood Loss  Result Date: 01/18/2020 CLINICAL DATA:  Acute rectal bleeding. EXAM: NUCLEAR MEDICINE GASTROINTESTINAL BLEEDING SCAN TECHNIQUE: Sequential abdominal images were obtained following intravenous administration of Tc-17m labeled red blood cells. RADIOPHARMACEUTICALS:  26.0 mCi Tc-55m pertechnetate in-vitro labeled red cells. COMPARISON:  March 15, 2018. FINDINGS: There is noted abnormal uptake in the left lower quadrant most consistent with active gastrointestinal hemorrhage arising from the descending and sigmoid colon. IMPRESSION: Abnormal uptake is noted in the left lower quadrant within the first hour after isotope administration, consistent with active gastrointestinal hemorrhage arising from the descending or sigmoid colon. Electronically Signed   By: Marijo Conception M.D.   On: 01/18/2020 14:06    ASSESMENT:   *   Diverticular bleed. 01/18/2020 nuclear medicine bleeding scan revealed active bleeding LLQ, in region of distal sigmoid. 01/18/2020 flexible sigmoidoscopy.  Brisk bleeding at site in sigmoid colon treated with hemostatic clips x 2 and epinephrine injection.  Nonbleeding internal hemorrhoids.  Blood seen within the rectum, rectosigmoid, sigmoid. Therapy yesterday appears to be successful, no recurrent bleeding.  *    Blood loss anemia.  Hgb 11 >> 7.6.  1 PRBC ordered by resident late this morning.  *    Chronic Eliquis, on hold.  There are some question as to whether she needs this any longer need cardiology to guide decision re: Eliquis.   PLAN   *   If not already consulted, please ask cardiology's opinion regarding need for long-term Eliquis.     *   HH diet.    *   Although not absolutely indicated, as her Hgb is not 7 or less, do not  disagree with ordered PRBC.    Azucena Freed  01/19/2020, 11:37 AM Phone [phone]

## 2020-01-20 DIAGNOSIS — I428 Other cardiomyopathies: Secondary | ICD-10-CM

## 2020-01-20 DIAGNOSIS — Z888 Allergy status to other drugs, medicaments and biological substances status: Secondary | ICD-10-CM

## 2020-01-20 LAB — BASIC METABOLIC PANEL
Anion gap: 7 (ref 5–15)
BUN: 31 mg/dL — ABNORMAL HIGH (ref 8–23)
CO2: 27 mmol/L (ref 22–32)
Calcium: 8.4 mg/dL — ABNORMAL LOW (ref 8.9–10.3)
Chloride: 104 mmol/L (ref 98–111)
Creatinine, Ser: 2.01 mg/dL — ABNORMAL HIGH (ref 0.44–1.00)
GFR calc Af Amer: 29 mL/min — ABNORMAL LOW (ref >60)
GFR calc non Af Amer: 25 mL/min — ABNORMAL LOW (ref >60)
Glucose, Bld: 106 mg/dL — ABNORMAL HIGH (ref 70–99)
Potassium: 4.1 mmol/L (ref 3.5–5.1)
Sodium: 138 mmol/L (ref 135–145)

## 2020-01-20 LAB — CBC
HCT: 26.8 % — ABNORMAL LOW (ref 36.0–46.0)
Hemoglobin: 8.4 g/dL — ABNORMAL LOW (ref 12.0–15.0)
MCH: 29.1 pg (ref 26.0–34.0)
MCHC: 31.3 g/dL (ref 30.0–36.0)
MCV: 92.7 fL (ref 80.0–100.0)
Platelets: 201 10*3/uL (ref 150–400)
RBC: 2.89 MIL/uL — ABNORMAL LOW (ref 3.87–5.11)
RDW: 15.5 % (ref 11.5–15.5)
WBC: 6.7 10*3/uL (ref 4.0–10.5)
nRBC: 0.3 % — ABNORMAL HIGH (ref 0.0–0.2)

## 2020-01-20 LAB — TYPE AND SCREEN
ABO/RH(D): AB POS
Antibody Screen: NEGATIVE
Unit division: 0

## 2020-01-20 LAB — BPAM RBC
Blood Product Expiration Date: 202103272359
ISSUE DATE / TIME: 202102251223
Unit Type and Rh: 8400

## 2020-01-20 MED ORDER — PANTOPRAZOLE SODIUM 40 MG PO TBEC: 40.0000 mg | DELAYED_RELEASE_TABLET | Freq: Every day | ORAL | 0 refills | Status: DC

## 2020-01-20 MED ORDER — FUROSEMIDE 80 MG PO TABS
80.0000 mg | ORAL_TABLET | Freq: Once | ORAL | Status: AC
  Administered 2020-01-20: 80 mg via ORAL
  Filled 2020-01-20: qty 1

## 2020-01-20 NOTE — Evaluation (Signed)
Physical Therapy Evaluation Patient Details Name: Patricia Wagner MRN: [id number] DOB: 1951-07-16 Today's Date: 01/20/2020   History of Present Illness  Pt is a 69 year old woman admitted on 01/18/20 with GIB. PMH: afib on Eliquis, CHF, HTN, diverticulosis, CAD, CKD IV.  Clinical Impression  Pt admitted with above diagnosis. Pt was able to ambulate with min guard assist to supervision only needing assist to stand and pt reports she will do better with her home set up as she pulls up on her rollator. Simultated this today with RW and pt was nervous.  Pt was supervision once up on her feet with RW. Should do fine at home but does want a LBQC and toilet riser. Agrees to get [facility] services as well.   Pt currently with functional limitations due to the deficits listed below (see PT Problem List). Pt will benefit from skilled PT to increase their independence and safety with mobility to allow discharge to the venue listed below.      Follow Up Recommendations Home health PT;Supervision - Intermittent(HHOT)    Equipment Recommendations  (toilet riser, LBQC)    Recommendations for Other Services       Precautions / Restrictions Precautions Precautions: Fall Precaution Comments: painful left knee  Restrictions Weight Bearing Restrictions: No      Mobility  Bed Mobility Overal bed mobility: Needs Assistance Bed Mobility: Supine to Sit     Supine to sit: Supervision     General bed mobility comments: Pt came to EOB without assist but took incr time  Transfers Overall transfer level: Needs assistance Equipment used: Rolling walker (2 wheeled) Transfers: Sit to/from Stand Sit to Stand: +2 physical assistance;Min assist         General transfer comment: with RW stabilized, assisted pt to rise using bed pad under hips, pt dislikes being assisted with gait belt  Ambulation/Gait Ambulation/Gait assistance: Supervision Gait Distance (Feet): 35 Feet Assistive device: Rolling walker (2  wheeled) Gait Pattern/deviations: Step-through pattern;Decreased stride length;Decreased step length - right;Decreased step length - left;Trunk flexed;Wide base of support   Gait velocity interpretation: <1.31 ft/sec, indicative of household ambulator General Gait Details: Pt very steady once up to RW.  Pt moves slowly but has her way of ambulating.  Pt needs no physical assist and states she is at her baseline.  Of note, pt states she is only going to use her cane at home to walk but wanted to use the rW today. She has a RW if needed. She also states that she pulls up on rollator to get up out of bed.  Pt states she is at her baseline and can d/c home but would like the quad cane for added support as her house is small and she would [facility] use the [facility].  Pt appears to have her own way to do things and is safe in hospital.    Stairs            Wheelchair Mobility    Modified Rankin (Stroke Patients Only)       Balance Overall balance assessment: Needs assistance Sitting-balance support: No upper extremity supported;Feet supported Sitting balance-Leahy Scale: Good Sitting balance - Comments: No issues sitting EOB   Standing balance support: Bilateral upper extremity supported;During functional activity Standing balance-Leahy Scale: Poor Standing balance comment: reliant on at least one hand stabilization in static standing  Pertinent Vitals/Pain Pain Assessment: Faces Faces Pain Scale: Hurts whole lot Pain Location: left knee Pain Descriptors / Indicators: Aching;Grimacing;Guarding Pain Intervention(s): Limited activity within patient's tolerance;Monitored during session;Repositioned    Home Living Family/patient expects to be discharged to:: Private residence Living Arrangements: Alone Available Help at Discharge: Family;Available PRN/intermittently Type of Home: Apartment Home Access: Level entry     Home Layout: One level Home  Equipment: Cane - single point;Walker - 2 wheels;Walker - 4 wheels;Tub bench      Prior Function Level of Independence: Independent with assistive device(s)         Comments: uses cane due to "bad L knee"     Hand Dominance   Dominant Hand: Right    Extremity/Trunk Assessment   Upper Extremity Assessment Upper Extremity Assessment: Defer to OT evaluation RUE Deficits / Details: painful shoulder, arthritic changes in hand RUE Coordination: decreased gross motor LUE Deficits / Details: painful shoulder, arthritic changes in hand LUE Coordination: decreased gross motor    Lower Extremity Assessment Lower Extremity Assessment: LLE deficits/detail LLE: Unable to fully assess due to pain    Cervical / Trunk Assessment Cervical / Trunk Assessment: Kyphotic  Communication   Communication: No difficulties  Cognition Arousal/Alertness: Awake/alert Behavior During Therapy: WFL for tasks assessed/performed Overall Cognitive Status: Within Functional Limits for tasks assessed                                        General Comments      Exercises General Exercises - Lower Extremity Ankle Circles/Pumps: AROM;Both;10 reps;Supine Long Arc Quad: AROM;Both;10 reps;Seated   Assessment/Plan    PT Assessment Patient needs continued PT services  PT Problem List Decreased activity tolerance;Decreased balance;Decreased mobility;Decreased knowledge of use of DME;Decreased safety awareness;Decreased knowledge of precautions       PT Treatment Interventions DME instruction;Gait training;Functional mobility training;Therapeutic activities;Therapeutic exercise;Balance training;Patient/family education    PT Goals (Current goals can be found in the Care Plan section)  Acute Rehab PT Goals Patient Stated Goal: return home PT Goal Formulation: With patient Time For Goal Achievement: 02/03/20 Potential to Achieve Goals: Good    Frequency Min 3X/week   Barriers to  discharge Decreased caregiver support      Co-evaluation PT/OT/SLP Co-Evaluation/Treatment: Yes Reason for Co-Treatment: For patient/therapist safety PT goals addressed during session: Mobility/safety with mobility OT goals addressed during session: ADL's and self-care;Proper use of Adaptive equipment and DME       AM-PAC PT "6 Clicks" Mobility  Outcome Measure Help needed turning from your back to your side while in a flat bed without using bedrails?: None Help needed moving from lying on your back to sitting on the side of a flat bed without using bedrails?: None Help needed moving to and from a bed to a chair (including a wheelchair)?: A Little Help needed standing up from a chair using your arms (e.g., wheelchair or bedside chair)?: A Little Help needed to walk in hospital room?: A Little Help needed climbing 3-5 steps with a railing? : A Little 6 Click Score: 20    End of Session Equipment Utilized During Treatment: Gait belt Activity Tolerance: Patient tolerated treatment well Patient left: in chair;with call bell/phone within reach Nurse Communication: Mobility status PT Visit Diagnosis: Muscle weakness (generalized) (M62.81)    Time: 0947-0962 PT Time Calculation (min) (ACUTE ONLY): 38 min   Charges:   PT Evaluation $PT Eval Moderate  Complexity: 1 Mod PT Treatments $Gait Training: 8-22 mins        Mort Smelser W,PT Acute Rehabilitation Services Pager:  [phone]  Office:  [place] 01/20/2020, 3:05 PM

## 2020-01-20 NOTE — Progress Notes (Signed)
   Discussed case with Dr. Court Joy and Dr. Curt Bears.   CHA2DS2VASC of at least 7, so would prefer to leave on long term [place].   OK to continue holding Eliquis for now, but needs to resume as soon as safe from bleeding perspective.    Ideally, would have PCP check CBC at appt 3/2, and resume Eliquis from there if stable.   Legrand Como [address] [place], [place]  01/20/2020 12:38 PM

## 2020-01-20 NOTE — Discharge Summary (Signed)
Name: Patricia Wagner MRN: [id number] DOB: 27-May-1951 69 y.o. PCP: Patient, No Pcp Per  Date of Admission: 01/18/2020  7:15 AM Date of Discharge: 01/20/2020 Attending Physician: Aldine Contes Discharge Diagnosis: 1. Diverticular Bleed  Discharge Medications: Allergies as of 01/20/2020       Reactions   Ace Inhibitors Palpitations        Medication List     STOP taking these medications    apixaban 5 MG Tabs tablet Commonly known as: ELIQUIS   hydrALAZINE 25 MG tablet Commonly known as: APRESOLINE   Vitamin D (Ergocalciferol) 1.25 MG (50000 UNIT) Caps capsule Commonly known as: DRISDOL       TAKE these medications    acetaminophen 325 MG tablet Commonly known as: TYLENOL Take 2 tablets (650 mg total) by mouth every 4 (four) hours as needed for headache or mild pain.   furosemide 80 MG tablet Commonly known as: LASIX TAKE 1 TABLET BY MOUTH EVERY DAY   isosorbide mononitrate 30 MG 24 hr tablet Commonly known as: IMDUR TAKE HALF A TABLET BY MOUTH EVERY DAY What changed:   how much to take  how to take this  when to take this  additional instructions   pantoprazole 40 MG tablet Commonly known as: PROTONIX Take 1 tablet (40 mg total) by mouth daily.   potassium chloride SA 20 MEQ tablet Commonly known as: KLOR-CON Take 2 tablets (40 mEq total) by mouth daily. What changed: how much to take   ranolazine 500 MG 12 hr tablet Commonly known as: RANEXA TAKE 1 TABLET BY MOUTH TWICE A DAY   rosuvastatin 10 MG tablet Commonly known as: CRESTOR Take 1 tablet (10 mg total) by mouth daily. What changed: when to take this   Vitamin D3 25 MCG (1000 UT) Caps Take 1,000 Units by mouth daily.   Voltaren 1 % Gel Generic drug: diclofenac Sodium Apply 1 application topically 2 (two) times daily as needed (Knee pain).        Disposition and follow-up:   Patricia Wagner was discharged from [facility] in Stable condition.  At the  hospital follow up visit please address:  1.   Diverticular Bleed   - Held Eliquis in setting of GI bleed. Please check CBC and if Hemoglobin stable consider restarting Eliquis  HTN Held Hydralazine in setting of acute bleed, patient normotensive on admission. Consider restarting.   2.  Labs / imaging needed at time of follow-up: BMP, CBC  3.  Pending labs/ test needing follow-up:  na  Follow-up Appointments: Follow-up Information     Levin Erp, PA Follow up on 01/30/2020.   Specialty: Gastroenterology Why: 2:30 PM office visit with GI PA, follow up GI bleed Contact information: [place] [address] [phone]         [facility]. Go to.   Why: go to lab next Thursday, any time between 8:30 and 5 PM for blood work Sport and exercise psychologist information: [address] [place] [address]        Forrest Moron, MD. Schedule an appointment as soon as possible for a visit in 1 week(s).   Specialty: Internal Medicine Contact information: [place] [address] [phone]         [facility] Follow up.   Specialty: [place] Why: Agency will call Contact information: [place] [address] [phone]  Hospital Course by problem list: Patricia Wagner is a 69 year old female with a past medical history notable for tobacco use disorder, HLD, HTN, HFrEF, persistent A. fib on Eliquis, nonischemic cardiomyopathy, CKD stage IV, and diverticulosis with prior bleed who presented with diverticular bleed in sigmoid colon. Patient is now s/p hemostatic clips x2 and epinephrine with cessation of bleeding.    #Diverticular bleed RBC scan positive for bleeding in the distal colon.  Flexible sigmoidoscopy performed with 2x hemostatic clips and epinephrine. Patient had no bleeding overnight. Denies any symptoms of anemia. Patient given 1 unit of pRBC yesterday for Hgb  7.6.Post transfusion Hgb 9.3, likely falsely elevated. On the morning of discharge  Hemoglobin is 8.4 and represents appropriate response to 1 unit. Continued holding Eliquis at [facility].  Patient will follow up with her new PCP March 2nd and will recommend starting Eliquis back at this time if her CBC is stable .This plan was made with EP Cardiology.   AKI on CKD stage IV Baseline creatinine 1.7-2.  Serum creatinine on admission 3.07.  Improving daily, down to 2.01 on discharge   Chronic Foley Patient has hx of urniary retention with chronic indwelling Foley.  P: - Exchanged foley on 2/25   #History of chronic combined systolic and diastolic heart failure: 11/6107 Echo demonstrated MR EF , EF 45-50% and impaired relaxation of left ventricle with severe concentric LV hypertrophy.  P: -We will give Lasix this morning with improvement in AKI - Continue Ranexa and Imdur   #HTN - Normotensive .  P: -Holding hydralazine  Discharge Vitals:   BP (!) 122/58 (BP Location: Left Arm)   Pulse 70   Temp 98.4 F (36.9 C) (Oral)   Resp 16   Ht 5\' 5"  (1.651 m)   Wt 91.2 kg   SpO2 100%   BMI 33.45 kg/m   Pertinent Labs, Studies, and Procedures:  CBC Latest Ref Rng & Units 01/20/2020 01/19/2020 01/19/2020  WBC 4.0 - 10.5 K/uL 6.7 7.8 5.4  Hemoglobin 12.0 - 15.0 g/dL 8.4(L) 9.3(L) 7.6(L)  Hematocrit 36.0 - 46.0 % 26.8(L) 30.2(L) 24.8(L)  Platelets 150 - 400 K/uL 201 238 203   BMP Latest Ref Rng & Units 01/20/2020 01/19/2020 01/18/2020  Glucose 70 - 99 mg/dL 106(H) 84 100(H)  BUN 8 - 23 mg/dL 31(H) 32(H) 38(H)  Creatinine 0.44 - 1.00 mg/dL 2.01(H) 2.28(H) 3.07(H)  BUN/Creat Ratio 12 - 28 - - -  Sodium 135 - 145 mmol/L 138 138 138  Potassium 3.5 - 5.1 mmol/L 4.1 3.1(L) 3.7  Chloride 98 - 111 mmol/L 104 103 99  CO2 22 - 32 mmol/L 27 25 25   Calcium 8.9 - 10.3 mg/dL 8.4(L) 8.3(L) 9.3   CLINICAL DATA:  Acute rectal bleeding.  EXAM: NUCLEAR MEDICINE GASTROINTESTINAL BLEEDING  SCAN  TECHNIQUE: Sequential abdominal images were obtained following intravenous administration of Tc-34m labeled red blood cells.  RADIOPHARMACEUTICALS:  26.0 mCi Tc-71m pertechnetate in-vitro labeled red cells.  COMPARISON:  March 15, 2018.  FINDINGS: There is noted abnormal uptake in the left lower quadrant most consistent with active gastrointestinal hemorrhage arising from the descending and sigmoid colon.  IMPRESSION: Abnormal uptake is noted in the left lower quadrant within the first hour after isotope administration, consistent with active gastrointestinal hemorrhage arising from the descending or sigmoid colon.    Discharge Instructions: Discharge Instructions     Diet - low sodium heart healthy   Complete by: As directed    Increase activity slowly   Complete by: As directed  Signed:  Tamsen Snider, MD PGY1

## 2020-01-20 NOTE — Discharge Instructions (Signed)
Patricia Wagner,  Thank you for trusting with [place] with your care.  You came in with found to have a bleeding diverticulum in your colon.  The bleeding was stopped by placing clips.  We gave you 1 unit of blood in your blood counts are stable today.  You may still pass some clots from your previous bleeding. However, should call EMS or come to the emergency room if you have recurrent bleeding.   We are holding your Eliquis until you meet with your new primary care physician on March 2.  We will request a check your blood counts at this time and resume Eliquis if it stable.  I discussed this with your cardiologist, Dr. Curt Bears.  My best,  Tamsen Snider, MD

## 2020-01-20 NOTE — Telephone Encounter (Signed)
error 

## 2020-01-20 NOTE — Care Management Important Message (Signed)
Important Message  Patient Details  Name: Patricia Wagner MRN: [id number] Date of Birth: 1951-11-08   Medicare Important Message Given:  Yes     Orbie Pyo 01/20/2020, 2:37 PM

## 2020-01-20 NOTE — TOC Initial Note (Addendum)
Transition of Care [facility]) - Initial/Assessment Note    Patient Details  Name: Patricia Wagner MRN: [id number] Date of Birth: Jan 07, 1951  [facility] CM/SW Contact:    Angelita Ingles, RN Phone Number: 01/20/2020, 1:06 PM  Clinical Narrative: Patient alert and oriented OOB to chair. Patient states that she needs a riser and cane.Quad cane need has been called to [place] with [employer] and will be delivered. Will update patient that riser is not covered by insurance. Patient states that she is from home alone and will return home where  she independently takes care of herself with the assistance of her daughter as needed. Patient does not have a primary physician listed but states that the office is in the process of making her a new patient and she will update staff on the name once she speaks with her daughter. Patient states that she is able to afford her medications and is currently on Eliquis with a $45 copay which she can afford. No further needs at this time will continue to follow.    Patient wit health orders, home health has been set up with [facility] arranged  with [facility] services to start Monday. Agency will notify patient.            Expected Discharge Plan: Home/Self Care Barriers to Discharge: Continued Medical Work up   Patient Goals and CMS Choice   CMS Medicare.gov Compare Post Acute Care list provided to:: Patient Choice offered to / list presented to : Patient  Expected Discharge Plan and Services Expected Discharge Plan: Home/Self Care                         DME Arranged: Cane(Quad cane) DME Agency: [employer] Date DME Agency Contacted: 01/20/20 Time DME Agency Contacted: 1914 Representative spoke with at DME Agency: Thedore Mins            Prior Living Arrangements/Services   Lives with:: Self Patient language and need for interpreter reviewed:: Yes Do you feel safe going back to the place where you live?: Yes      Need for Family Participation in  Patient Care: No (Comment) Care giver support system in place?: Yes (comment)   Criminal Activity/Legal Involvement Pertinent to Current Situation/Hospitalization: No - Comment as needed  Activities of Daily Living Home Assistive Devices/Equipment: Cane (specify quad or straight), Eyeglasses, Walker (specify type) ADL Screening (condition at time of admission) Patient's cognitive ability adequate to safely complete daily activities?: No Is the patient deaf or have difficulty hearing?: No Does the patient have difficulty seeing, even when wearing glasses/contacts?: No Does the patient have difficulty concentrating, remembering, or making decisions?: No Patient able to express need for assistance with ADLs?: Yes Does the patient have difficulty dressing or bathing?: No Independently performs ADLs?: Yes (appropriate for developmental age) Does the patient have difficulty walking or climbing stairs?: Yes("right now my leg is messed up, but usually not") Weakness of Legs: Right Weakness of Arms/Hands: None  Permission Sought/Granted                  Emotional Assessment Appearance:: Appears stated age Attitude/Demeanor/Rapport: Gracious Affect (typically observed): Calm Orientation: : Oriented to Self, Oriented to Place, Oriented to  Time, Oriented to Situation   Psych Involvement: No (comment)  Admission diagnosis:  Acute lower GI bleeding [K92.2] Lower GI bleed [K92.2] AKI (acute kidney injury) ([place]) [N17.9] Patient Active Problem List   Diagnosis Date Noted  . Acute lower  GI bleeding 01/18/2020  . Acquired thrombophilia ([place]) 11/14/2019  . Persistent atrial fibrillation ([place]) 10/13/2019  . Allergic rhinitis 09/10/2019  . Left ear pain 09/10/2019  . Vitamin D deficiency 04/15/2019  . Cardiogenic shock ([place])   . Acute on chronic respiratory failure with hypoxia ([place])   . Elevated troponin 08/05/2018  . Hyperbilirubinemia 08/05/2018  . Abnormal transaminases 08/05/2018   . Dysarthria 08/05/2018  . Lower GI bleed 03/15/2018  . Chronic anticoagulation 03/15/2018  . Rectal bleed   . Acute renal failure superimposed on stage 4 chronic kidney disease ([place]) 03/05/2018  . Coagulopathy ([place]) 03/05/2018  . Chronic systolic (congestive) heart failure ([place]) 03/05/2018  . Chronic atrial fibrillation 03/05/2018  . Acute blood loss anemia 03/05/2018  . Diverticulosis 03/05/2018  . Heme positive stool 03/05/2018  . Urine abnormality 02/02/2018  . Cough 02/02/2018  . Bacteremia due to Gram-negative bacteria 12/30/2017  . Increased anion gap metabolic acidosis 89/37/3428  . Metabolic alkalosis 76/81/1572  . Acute UTI 12/29/2017  . Obstructive uropathy   . Anemia 12/17/2017  . AKI (acute kidney injury) ([place]) 11/30/2017  . Diarrhea 11/30/2017  . Urinary retention 11/23/2017  . Lower abdominal pain 11/23/2017  . Dysuria 07/21/2017  . Suspected sleep apnea 05/20/2017  . Pressure injury of skin 05/02/2017  . Acute respiratory failure with hypoxia ([place]) 04/30/2017  . Hypotension (arterial) 04/30/2017  . Bradycardia 11/20/2016  . Chronic combined systolic (congestive) and diastolic (congestive) heart failure ([place]) 11/20/2016  . Cerebrovascular accident (CVA) due to embolism of left cerebellar artery ([place])   . PAF (paroxysmal atrial fibrillation) ([place])   . CHF (congestive heart failure) ([place]) 10/31/2016  . CAD in native artery, s/p cardiac cath with non obstructive CAD 10/24/2016  . NICM (nonischemic cardiomyopathy) ([place]) 10/24/2016  . Tobacco abuse 10/20/2016  . Acute renal failure superimposed on chronic kidney disease ([place]) 10/20/2016  . Hematochezia 06/29/2013  . Degenerative arthritis of left knee 03/15/2012  . Encounter for well adult exam with abnormal findings 03/12/2012  . Hypersomnia   . MENOPAUSAL DISORDER 01/09/2011  . THYROID NODULE, RIGHT 01/04/2010  . HLD (hyperlipidemia) 02/03/2008  . LEG CRAMPS, NOCTURNAL 02/03/2008  . LOW BACK PAIN 07/25/2007   . Morbid obesity ([place]) 07/22/2007  . Essential hypertension 07/22/2007  . Raynaud's syndrome 07/22/2007  . Diverticulosis of colon with hemorrhage 07/22/2007  . SYMPTOM, EDEMA 07/22/2007   PCP:  Patient, No Pcp Per Pharmacy:   [id number] [address], [place] [address] Phone: [phone] Fax: [phone]     Social Determinants of Health (SDOH) Interventions    Readmission Risk Interventions No flowsheet data found.

## 2020-01-20 NOTE — Evaluation (Signed)
Occupational Therapy Evaluation Patient Details Name: Patricia Wagner MRN: [id number] DOB: 05-23-1951 Today's Date: 01/20/2020    History of Present Illness Pt is a 69 year old woman admitted on 01/18/20 with GIB. PMH: afib on Eliquis, CHF, HTN, diverticulosis, CAD, CKD IV.   Clinical Impression   Pt typically functions modified independently and walks with a cane. She sponges bathes, although she has a tub bench, she does not know how to use it. Pt reports difficulty standing from her low toilet. She suffers with a painful R knee limiting her mobility. Pt likely near her baseline, but could benefit from [facility] to educate her in fall prevention, compensatory strategies for IADL and DME use. Pt is agreeable to [facility]. Will follow acutely.    Follow Up Recommendations  Home health OT    Equipment Recommendations  Toilet riser    Recommendations for Other Services       Precautions / Restrictions Precautions Precautions: Fall Precaution Comments: painful L knee  Restrictions Weight Bearing Restrictions: No      Mobility Bed Mobility               General bed mobility comments: pt at EOB upon OT's arrival  Transfers Overall transfer level: Needs assistance Equipment used: Rolling walker (2 wheeled) Transfers: Sit to/from Stand Sit to Stand: +2 physical assistance;Min assist         General transfer comment: with RW stabilized, assisted pt to rise using bed pad under hips, pt dislikes being assisted with gait belt    Balance Overall balance assessment: Needs assistance   Sitting balance-Leahy Scale: Good       Standing balance-Leahy Scale: Poor Standing balance comment: reliant on at least one hand stabilization in static standing                            ADL either performed or assessed with clinical judgement   ADL Overall ADL's : Needs assistance/impaired Eating/Feeding: Independent   Grooming: Min guard;Standing   Upper Body Bathing: Set  up;Sitting   Lower Body Bathing: +2 for physical assistance;Minimal assistance;Sit to/from stand   Upper Body Dressing : Set up;Sitting   Lower Body Dressing: +2 for physical assistance;Minimal assistance;Sit to/from stand Lower Body Dressing Details (indicate cue type and reason): can cross foot over opposite knee to don socks Toilet Transfer: Min guard;Ambulation;RW           Functional mobility during ADLs: Min guard;Rolling walker General ADL Comments: Pt is safer with RW, but insists she wants at quad cane as a RW would be too hard to get in and out of the car     Vision Patient Visual Report: No change from baseline       Perception     Praxis      Pertinent Vitals/Pain Pain Assessment: Faces Faces Pain Scale: Hurts whole lot Pain Location: right knee Pain Descriptors / Indicators: Aching;Grimacing;Guarding Pain Intervention(s): Monitored during session;Repositioned     Hand Dominance Right   Extremity/Trunk Assessment Upper Extremity Assessment Upper Extremity Assessment: RUE deficits/detail;LUE deficits/detail RUE Deficits / Details: painful shoulder, arthritic changes in hand RUE Coordination: decreased gross motor LUE Deficits / Details: painful shoulder, arthritic changes in hand LUE Coordination: decreased gross motor   Lower Extremity Assessment Lower Extremity Assessment: Defer to PT evaluation       Communication Communication Communication: No difficulties   Cognition Arousal/Alertness: Awake/alert Behavior During Therapy: WFL for tasks assessed/performed Overall Cognitive Status:  Within Functional Limits for tasks assessed                                     General Comments       Exercises     Shoulder Instructions      Home Living Family/patient expects to be discharged to:: Private residence Living Arrangements: Alone Available Help at Discharge: Family;Available PRN/intermittently Type of Home: Apartment Home  Access: Level entry     Home Layout: One level     Bathroom Shower/Tub: Teacher, early years/pre: Handicapped height     Home Equipment: Cane - single point;Walker - 2 wheels;Walker - 4 wheels;Tub bench          Prior Functioning/Environment Level of Independence: Independent with assistive device(s)        Comments: uses cane due to "bad L knee"        OT Problem List: Decreased strength;Decreased activity tolerance;Impaired balance (sitting and/or standing);Decreased knowledge of use of DME or AE;Obesity;Pain      OT Treatment/Interventions: Self-care/ADL training;DME and/or AE instruction;Patient/family education;Balance training;Therapeutic activities    OT Goals(Current goals can be found in the care plan section) Acute Rehab OT Goals Patient Stated Goal: return home OT Goal Formulation: With patient Time For Goal Achievement: 02/03/20 Potential to Achieve Goals: Good  OT Frequency: Min 2X/week   Barriers to D/C:            Co-evaluation PT/OT/SLP Co-Evaluation/Treatment: Yes Reason for Co-Treatment: For patient/therapist safety   OT goals addressed during session: ADL's and self-care;Proper use of [facility] equipment and DME      AM-PAC OT "6 Clicks" Daily Activity     Outcome Measure Help from another person eating meals?: None Help from another person taking care of personal grooming?: A Little Help from another person toileting, which includes using toliet, bedpan, or urinal?: A Little Help from another person bathing (including washing, rinsing, drying)?: A Little Help from another person to put on and taking off regular upper body clothing?: None Help from another person to put on and taking off regular lower body clothing?: A Little 6 Click Score: 20   End of Session Equipment Utilized During Treatment: Gait belt;Rolling walker  Activity Tolerance: Patient tolerated treatment well Patient left: in chair;with call bell/phone within  reach  OT Visit Diagnosis: Other abnormalities of gait and mobility (R26.89);Unsteadiness on feet (R26.81);Pain;Muscle weakness (generalized) (M62.81) Pain - Right/Left: Right Pain - part of body: Knee                Time: 5038-8828 OT Time Calculation (min): 28 min Charges:  OT General Charges $OT Visit: 1 Visit OT Evaluation $OT Eval Moderate Complexity: 1 Mod  Nestor Lewandowsky, OTR/L Acute Rehabilitation Services Pager: [phone] Office: [phone]  Malka So 01/20/2020, 12:20 PM

## 2020-01-20 NOTE — Progress Notes (Signed)
   Subjective:  Patient notes feeling well this morning. She had a bowel movement yesterday without any blood noted.  Discussed patient's Eliquis and recommendation for holding it on discharge. Will touch base with cardiologist. Patient is not taking protonix at home. Discussed re-starting this on discharge.   PCP: Patient prefers Patricia Wagner    Objective:  Vital signs in last 24 hours: Vitals:   01/19/20 1215 01/19/20 1242 01/19/20 1700 01/19/20 1922  BP: 108/67 (!) 100/56 131/69 122/65  Pulse: 76 78  80  Resp: 19 19 19 15   Temp: 98.3 F (36.8 C) 98.6 F (37 C) 99 F (37.2 C) 98.9 F (37.2 C)  TempSrc: Oral Oral Oral   SpO2: 95% 93% 95% 95%  Weight:      Height:       Gen: NAD, normal appearing CV: Regular rate and rhythm, no murmur , rubs, or gallops Pulmonary: CTAB, no wheezes, rales , or rhonchi Abd: Bowel sounds present, no tenderness to palpation   CBC Latest Ref Rng & Units 01/20/2020 01/19/2020 01/19/2020  WBC 4.0 - 10.5 K/uL 6.7 7.8 5.4  Hemoglobin 12.0 - 15.0 g/dL 8.4(L) 9.3(L) 7.6(L)  Hematocrit 36.0 - 46.0 % 26.8(L) 30.2(L) 24.8(L)  Platelets 150 - 400 K/uL 201 238 203     Assessment/Plan:  Principal Problem:   Acute lower GI bleeding Active Problems:   Essential hypertension   Chronic combined systolic (congestive) and diastolic (congestive) heart failure (HCC)   AKI (acute kidney injury) ([place])   Acute renal failure superimposed on stage 4 chronic kidney disease (HCC)   Chronic atrial fibrillation   Diverticulosis   Chronic anticoagulation   Patricia Wagner is a 69 year old female with a past medical history notable for tobacco use disorder, HLD, HTN, HFrEF, persistent A. fib on Eliquis, nonischemic cardiomyopathy, CKD stage IV, and diverticulosis with prior bleed who presented with diverticular bleed in sigmoid colon. Patient is now s/p hemostatic clips x2 and epinephrine with cessation of bleeding.   #Diverticular bleed RBC scan positive for bleeding  in the distal colon.  Flexible sigmoidoscopy performed with 2x hemostatic clips and epinephrine. Patient has had no bleeding overnight. Denies any symptoms of anemia. Patient given 1 unit of pRBC yesterday for Hgb 7.6.. Post transfusion Hgb 9.3, likely falsely elevated. This morning patient's Hemoglobin is 8.4 and represents appropriate response to 1 unit.   P: -Continue holding Eliquis at [facility].  Patient will follow up with her new PCP March 2nd and will recommend starting Eliquis back at this time if her CBC is stable .This plan was made with EP Cardiology.  AKI on CKD stage IV Baseline creatinine 1.7-2.  Serum creatinine on admission 3.07.  Improving daily, down to 2.01 today.  Chronic Foley Patient has hx of urniary retention with chronic indwelling Foley.  P: - Exchanged foley on 2/25  #History of chronic combined systolic and diastolic heart failure: 02/7828 Echo demonstrated MR EF , EF 45-50% and impaired relaxation of left ventricle with severe concentric LV hypertrophy.  P: -We will give Lasix this morning with improvement in AKI - Continue Ranexa and Imdur  #HTN - Normotensive .  P: -Holding hydralazine  CP: Dr. Doree Barthel made patient appointment for follow-up with new PCP's office, Patricia Wagner.   Prior to Admission Living Arrangement:home Anticipated Discharge Location:home Barriers to Discharge: none Dispo: Anticipated discharge today.  Patricia Snider, MD PGY1

## 2020-01-23 ENCOUNTER — Other Ambulatory Visit: Payer: Self-pay

## 2020-01-23 DIAGNOSIS — M170 Bilateral primary osteoarthritis of knee: Secondary | ICD-10-CM | POA: Diagnosis not present

## 2020-01-23 NOTE — Patient Outreach (Signed)
[facility]) Care Management  01/23/2020  YENTL VERGE [birth year] [id number]   Patient discharged from [facility] on 01-20-20 GI Bleed.  Referral received.  No outreach warranted at this time.  Transition of Care calls being completed via EMMI. RN CM will outreach patient for any red flags received.    Plan: RN CM will close case.    Jone Baseman, RN, MSN [facility] Care Management Coordinator Direct Line [phone] Cell [phone] Toll Free: [phone]  Fax: [phone]

## 2020-01-24 ENCOUNTER — Telehealth: Payer: Self-pay | Admitting: Registered Nurse

## 2020-01-24 ENCOUNTER — Ambulatory Visit: Payer: Managed Care, Other (non HMO) | Admitting: Registered Nurse

## 2020-01-24 ENCOUNTER — Encounter: Payer: Self-pay | Admitting: Registered Nurse

## 2020-01-24 ENCOUNTER — Ambulatory Visit (INDEPENDENT_AMBULATORY_CARE_PROVIDER_SITE_OTHER): Payer: Medicare HMO | Admitting: Registered Nurse

## 2020-01-24 ENCOUNTER — Telehealth: Payer: Self-pay | Admitting: Cardiology

## 2020-01-24 ENCOUNTER — Telehealth: Payer: Self-pay | Admitting: *Deleted

## 2020-01-24 ENCOUNTER — Other Ambulatory Visit: Payer: Self-pay

## 2020-01-24 VITALS — BP 132/71 | HR 68 | Temp 97.9°F | Ht 65.0 in

## 2020-01-24 DIAGNOSIS — M1990 Unspecified osteoarthritis, unspecified site: Secondary | ICD-10-CM

## 2020-01-24 DIAGNOSIS — K922 Gastrointestinal hemorrhage, unspecified: Secondary | ICD-10-CM

## 2020-01-24 DIAGNOSIS — N184 Chronic kidney disease, stage 4 (severe): Secondary | ICD-10-CM | POA: Diagnosis not present

## 2020-01-24 DIAGNOSIS — E559 Vitamin D deficiency, unspecified: Secondary | ICD-10-CM

## 2020-01-24 DIAGNOSIS — J9602 Acute respiratory failure with hypercapnia: Secondary | ICD-10-CM | POA: Diagnosis not present

## 2020-01-24 DIAGNOSIS — I10 Essential (primary) hypertension: Secondary | ICD-10-CM

## 2020-01-24 DIAGNOSIS — I7300 Raynaud's syndrome without gangrene: Secondary | ICD-10-CM | POA: Diagnosis not present

## 2020-01-24 DIAGNOSIS — E041 Nontoxic single thyroid nodule: Secondary | ICD-10-CM

## 2020-01-24 DIAGNOSIS — I130 Hypertensive heart and chronic kidney disease with heart failure and stage 1 through stage 4 chronic kidney disease, or unspecified chronic kidney disease: Secondary | ICD-10-CM | POA: Diagnosis not present

## 2020-01-24 DIAGNOSIS — N179 Acute kidney failure, unspecified: Secondary | ICD-10-CM | POA: Diagnosis not present

## 2020-01-24 MED ORDER — TRAMADOL HCL 50 MG PO TABS: 50.0000 mg | ORAL_TABLET | Freq: Two times a day (BID) | ORAL | 0 refills | Status: DC | PRN

## 2020-01-24 NOTE — Progress Notes (Signed)
New Patient Office Visit  Subjective:  Patient ID: Patricia Wagner, female    DOB: 11-17-1951  Age: 69 y.o. MRN: [id number]  CC:  Chief Complaint  Patient presents with  . New Patient (Initial Visit)    establish care . patient would like to discuss arthritis in both her hands. Also her Thyroids    HPI Patricia Wagner presents for visit to establish care.  She was recently admitted to the hospital for a lower GI bleed that was determined to be due to diverticulosis. Luckily, they were able to use medications and clips to stop the bleeding.  She was noted at that time to have AKI on top of her Stage IV CKD. GFR improved to 20s by the time she was dc'd with Cr 2.08. She has a history of urinary retention and was scheduled for a cystoscopy until her admission delayed this plan.  Also has a hx of afib, chf, cad, stroke, and coagulopathy. Had been on lovenox, warfarin and most recently apixaban for thinners. Has followed well with cardiology. S/p ablation after failing cardioversion and pharmacologic therapies.   She also has a history of a thyroid nodule R and is interested in getting her thyroid profile drawn today.  Lastly, she suffers from arthritis in both hands with visible Heberden's nodules. Cannot take NSAIDs because of GI bleed and renal disease. Has been taking tylenol 500mg  PO qid PRN with some relief. Has tried voltaren gel without relief. Interested in getting something to help her pain and limited ROM in hands.  Past Medical History:  Diagnosis Date  . Acute blood loss anemia 03/05/2018  . Acute hypercapnic respiratory failure ([place]) 04/30/2017  . Acute renal failure superimposed on chronic kidney disease ([place]) 10/20/2016  . Arthritis   . Atrial fibrillation ([place])    a. s/p multiple cardioversions; failed tikosyn/sotalol.  . Bradycardia 11/20/2016  . CAD in native artery, s/p cardiac cath with non obstructive CAD 10/24/2016  . Cerebrovascular accident (CVA) due to embolism of left  cerebellar artery ([place])   . CHF (congestive heart failure) ([place])   . Chronic anticoagulation 03/15/2018  . Chronic atrial fibrillation ([place]) 03/05/2018  . Chronic systolic (congestive) heart failure ([place]) 03/05/2018  . CKD (chronic kidney disease) stage 3, GFR 30-59 ml/min   . Coagulopathy ([place]) 03/05/2018  . DIVERTICULITIS, HX OF 07/25/2007  . Diverticulosis of colon with hemorrhage 07/22/2007   Qualifier: Diagnosis of  By: [name]    . DIVERTICULOSIS, COLON 07/22/2007  . Dyspnea   . Dysuria 07/21/2017  . Edema, peripheral    a. chronic BLE edema, R>L. Prior trauma from dog attack and accident.  . Essential hypertension 07/22/2007   Qualifier: Diagnosis of  By: [name]    . HLD (hyperlipidemia) 02/03/2008   Qualifier: Diagnosis of  By: [name], [name]   . HYPERLIPIDEMIA 02/03/2008  . Hypersomnia    declines w/u  . Hypertension   . Hypotension (arterial) 04/30/2017  . MENOPAUSAL DISORDER 01/09/2011  . Morbid obesity ([place]) 07/22/2007  . NICM (nonischemic cardiomyopathy) ([place]) 10/24/2016  . PAF (paroxysmal atrial fibrillation) ([place])   . Raynaud's syndrome 07/22/2007  . Stroke ([place]) 2017  . Suspected sleep apnea 05/20/2017  . THYROID NODULE, RIGHT 01/04/2010  . VITAMIN D DEFICIENCY 01/09/2011   Qualifier: Diagnosis of  By: [name], [name]     Past Surgical History:  Procedure Laterality Date  . ATRIAL FIBRILLATION ABLATION N/A 10/13/2019   Procedure: ATRIAL FIBRILLATION ABLATION;  Surgeon: Curt Bears,  Ocie Doyne, MD;  Location: [place] [facility];  Service: Cardiovascular;  Laterality: N/A;  . CARDIAC CATHETERIZATION N/A 10/23/2016   Procedure: Left Heart Cath and Coronary Angiography;  Surgeon: [name], MD;  Location: [place] [facility];  Service: Cardiovascular;  Laterality: N/A;  . CARDIOVERSION N/A 10/31/2016   Procedure: CARDIOVERSION;  Surgeon: [name], MD;  Location: [place];  Service: Cardiovascular;  Laterality: N/A;  . CARDIOVERSION N/A 11/03/2016    Procedure: CARDIOVERSION;  Surgeon: [name], MD;  Location: [place];  Service: Cardiovascular;  Laterality: N/A;  . CARDIOVERSION N/A 11/18/2016   Procedure: CARDIOVERSION;  Surgeon: [name], MD;  Location: [place];  Service: Cardiovascular;  Laterality: N/A;  . CARDIOVERSION N/A 03/25/2017   Procedure: CARDIOVERSION;  Surgeon: [name], MD;  Location: [facility] ENDOSCOPY;  Service: Cardiovascular;  Laterality: N/A;  . CARDIOVERSION N/A 05/06/2017   Procedure: CARDIOVERSION;  Surgeon: [name], MD;  Location: [facility] ENDOSCOPY;  Service: Cardiovascular;  Laterality: N/A;  . CARDIOVERSION N/A 05/12/2017   Procedure: CARDIOVERSION;  Surgeon: [name], MD;  Location: [facility] ENDOSCOPY;  Service: Cardiovascular;  Laterality: N/A;  . COLONOSCOPY N/A 12/04/2017   Procedure: COLONOSCOPY;  Surgeon: [name], MD;  Location: [place];  Service: Endoscopy;  Laterality: N/A;  . COLONOSCOPY W/ POLYPECTOMY  02/2011   pan diverticulosis.  tubular adenoma without dysplasia on 5 mm sigmoid polyp.  Dr Fuller Plan.    Marland Kitchen FLEXIBLE SIGMOIDOSCOPY N/A 01/18/2020   Procedure: FLEXIBLE SIGMOIDOSCOPY;  Surgeon: [name], DO;  Location: [place];  Service: Gastroenterology;  Laterality: N/A;  . HEMOSTASIS CLIP PLACEMENT  01/18/2020   Procedure: HEMOSTASIS CLIP PLACEMENT;  Surgeon: [name], DO;  Location: [facility] ENDOSCOPY;  Service: Gastroenterology;;  . PARTIAL HYSTERECTOMY     1 OVARY LEFT  . SCLEROTHERAPY  01/18/2020   Procedure: SCLEROTHERAPY;  Surgeon: [name], DO;  Location: [place] ENDOSCOPY;  Service: Gastroenterology;;  . TEE WITHOUT CARDIOVERSION N/A 10/31/2016   Procedure: TRANSESOPHAGEAL ECHOCARDIOGRAM (TEE);  Surgeon: [name], MD;  Location: [facility] ENDOSCOPY;  Service: Cardiovascular;  Laterality: N/A;  . TEE WITHOUT CARDIOVERSION N/A 03/25/2017   Procedure: TRANSESOPHAGEAL ECHOCARDIOGRAM (TEE);  Surgeon: [name], MD;  Location: [facility]  ENDOSCOPY;  Service: Cardiovascular;  Laterality: N/A;    Family History  Problem Relation Age of Onset  . Asthma Mother   . Diabetes Father   . Heart disease Father        Died of presumed heart attack - 92s  . Lung disease Sister   . Heart disease Sister        Twin sister has heart issue, unclear what kind  . Thyroid disease Neg Hx     Social History   Socioeconomic History  . Marital status: Single    Spouse name: Not on file  . Number of children: 1  . Years of education: Not on file  . Highest education level: Not on file  Occupational History  . Occupation: Academic librarian   Tobacco Use  . Smoking status: Former Smoker    Packs/day: 0.50    Types: Cigarettes    Start date: 2019  . Smokeless tobacco: Never Used  Substance and Sexual Activity  . Alcohol use: No    Alcohol/week: 4.0 standard drinks    Types: 4 Cans of beer per week    Comment: Intermittent  . Drug use: No  . Sexual activity: Not on file  Other Topics Concern  .  Not on file  Social History Narrative  . Not on file   Social Determinants of Health   Financial Resource Strain:   . Difficulty of Paying Living Expenses: Not on file  Food Insecurity:   . Worried About Charity fundraiser in the Last Year: Not on file  . Ran Out of Food in the Last Year: Not on file  Transportation Needs:   . Lack of Transportation (Medical): Not on file  . Lack of Transportation (Non-Medical): Not on file  Physical Activity:   . Days of Exercise per Week: Not on file  . Minutes of Exercise per Session: Not on file  Stress:   . Feeling of Stress : Not on file  Social Connections:   . Frequency of Communication with Friends and Family: Not on file  . Frequency of Social Gatherings with Friends and Family: Not on file  . Attends Religious Services: Not on file  . Active Member of Clubs or Organizations: Not on file  . Attends Archivist Meetings: Not on file  . Marital Status: Not on file  Intimate  Partner Violence:   . Fear of Current or Ex-Partner: Not on file  . Emotionally Abused: Not on file  . Physically Abused: Not on file  . Sexually Abused: Not on file    ROS Review of Systems  Constitutional: Negative.   HENT: Negative.   Eyes: Negative.   Respiratory: Negative.   Cardiovascular: Negative.   Gastrointestinal: Negative.   Endocrine: Negative.  Negative for cold intolerance, heat intolerance, polydipsia, polyphagia and polyuria.  Genitourinary: Negative.   Musculoskeletal: Positive for arthralgias. Negative for back pain, gait problem, joint swelling, myalgias, neck pain and neck stiffness.  Skin: Negative.   Allergic/Immunologic: Negative.   Neurological: Negative.   Hematological: Negative.   Psychiatric/Behavioral: Negative.   All other systems reviewed and are negative.   Objective:   Today's Vitals: BP 132/71   Pulse 68   Temp 97.9 F (36.6 C) (Temporal)   Ht 5\' 5"  (1.651 m)   SpO2 99%   BMI 33.45 kg/m   Physical Exam Vitals and nursing note reviewed.  Constitutional:      General: She is not in acute distress.    Appearance: Normal appearance. She is normal weight. She is not ill-appearing, toxic-appearing or diaphoretic.  Cardiovascular:     Rate and Rhythm: Normal rate and regular rhythm.     Pulses: Normal pulses.     Heart sounds: Normal heart sounds.  Pulmonary:     Effort: Pulmonary effort is normal. No respiratory distress.  Skin:    Capillary Refill: Capillary refill takes less than 2 seconds.  Neurological:     General: No focal deficit present.     Mental Status: She is alert and oriented to person, place, and time. Mental status is at baseline.     Cranial Nerves: No cranial nerve deficit.  Psychiatric:        Mood and Affect: Mood normal.        Behavior: Behavior normal.        Thought Content: Thought content normal.        Judgment: Judgment normal.     Assessment & Plan:   Problem List Items Addressed This Visit       Cardiovascular and Mediastinum   Essential hypertension - Primary (Chronic)   Relevant Orders   Comprehensive metabolic panel   CBC with Differential   Lipid Panel   Raynaud's syndrome  Relevant Orders   ANA     Digestive   Lower GI bleed   Relevant Orders   Comprehensive metabolic panel   CBC with Differential     Endocrine   THYROID NODULE, RIGHT   Relevant Orders   Thyroid Profile     Other   Vitamin D deficiency   Relevant Orders   Vitamin D, 25-hydroxy    Other Visit Diagnoses    Arthritis       Relevant Medications   traMADol (ULTRAM) 50 MG tablet      Outpatient Encounter Medications as of 01/24/2020  Medication Sig  . acetaminophen (TYLENOL) 325 MG tablet Take 2 tablets (650 mg total) by mouth every 4 (four) hours as needed for headache or mild pain.  . Cholecalciferol (VITAMIN D3) 25 MCG (1000 UT) CAPS Take 1,000 Units by mouth daily.  . furosemide (LASIX) 80 MG tablet TAKE 1 TABLET BY MOUTH EVERY DAY (Patient taking differently: Take 80 mg by mouth daily. )  . isosorbide mononitrate (IMDUR) 30 MG 24 hr tablet TAKE HALF A TABLET BY MOUTH EVERY DAY (Patient taking differently: Take 15 mg by mouth daily. )  . pantoprazole (PROTONIX) 40 MG tablet Take 1 tablet (40 mg total) by mouth daily.  . potassium chloride SA (K-DUR,KLOR-CON) 20 MEQ tablet Take 2 tablets (40 mEq total) by mouth daily. (Patient taking differently: Take 20 mEq by mouth daily. )  . ranolazine (RANEXA) 500 MG 12 hr tablet TAKE 1 TABLET BY MOUTH TWICE A DAY (Patient taking differently: Take 500 mg by mouth 2 (two) times daily. )  . rosuvastatin (CRESTOR) 10 MG tablet Take 1 tablet (10 mg total) by mouth daily. (Patient taking differently: Take 10 mg by mouth at bedtime. )  . VOLTAREN 1 % GEL Apply 1 application topically 2 (two) times daily as needed (Knee pain).   . traMADol (ULTRAM) 50 MG tablet Take 1 tablet (50 mg total) by mouth 2 (two) times daily as needed for up to 8 days.   No  facility-administered encounter medications on file as of 01/24/2020.    Follow-up: Return in about 6 weeks (around 03/06/2020) for follow up on chronic conditions.   PLAN  Tramadol 25 mg PO tid PRN for arthritis pain. Discussed appropriate tylenol dosing. Pt agreeable to plan  Will draw labs: thyroid panel, cbc w diff, cmp, a1c, lipid panel, ana, vit d. Will follow up as warranted with patient.  Going to reach out to cardiology team to discuss restarting her anti-coagulant apixaban.   She has already been set up with home health - visits will start next week.   Overall, she is feeling good   We will see her for follow up in 6 weeks.  Maximiano Coss, NP

## 2020-01-24 NOTE — Telephone Encounter (Signed)
Pt does not have any anticoagulation listed on her medication list. Need to clarify if she is still taking Eliquis. Per Dr Macky Lower most recent note, pt was to continue on anticoagulation. Eliquis was still on pt's med list at that visit, looks like was it was discontinued from med list on 01/20/20 by Madalyn Rob MD with instructions to stop taking at discharge after admission for lower GI bleed. Discharge summary states "Diverticular Bleed - Held Eliquis in setting of GI bleed. Please check CBC and if Hemoglobin stable consider restarting Eliquis"  Pt is now > 3 months out from ablation, indication for Eliquis is afib with CHADS2VASc score of 7 (age, sex, CHF, HTN, CAD, stroke). SCr 2.01, CrCl 71mL/min using actual body weight, 23mL/min using adjusted body weight.  Typically hold Eliquis for 3 days prior to TKA due to spinal anesthesia used.  First, need to clarify with pt if she has resumed Eliquis. If so, will need to defer to MD regarding appropriate length of anticoag hold in the setting of elevated cardiac risk including prior stroke, as well as renal dysfunction and recent GI bleed.

## 2020-01-24 NOTE — Patient Instructions (Addendum)
  Ms Nneka Blanda to meet you today. In brief, here's what we covered:   Arthritis pain: take as much as 2,000mg  of tylenol daily. This means you can take as much as 6 tabs of your 325mg  dose. In addition, you can take half of a tablet of tramadol up to four times daily for breakthrough pain. Please try to take this sparingly and spaced out. You can continue using the voltaren gel on your hands if you'd like. Please do not take ibuprofen  Thryoid: we are checking a thyroid panel today. Results should be back in a day or two  Labs: I'm also checking blood counts, liver and kidney function, some electrolytes, cholesterol, autoimmune antibodies, and blood sugars.   Blood thinner: Once your labs are back, I'll be getting in touch with cardiology to determine if we want to restart your blood thinners. I'll let you know the plan we agree on.  Kidney function: if your kidney function is still low, we will need to discuss getting you back in touch with your kidney doctor (nephrology).   I'd like to see you again in about 6 weeks for some follow up labs and to keep an eye on you.   If you have any questions in the mean time, please reach out and ask - I'd be more than happy to address these for you.   If you have lab work done today you will be contacted with your lab results within the next 2 weeks.  If you have not heard from [place] then please contact us. The fastest way to get your results is to register for My Chart.   IF you received an x-ray today, you will receive an invoice from [facility]. Please contact [facility] at [phone] with questions or concerns regarding your invoice.   IF you received labwork today, you will receive an invoice from [facility]. Please contact [facility] at [phone] with questions or concerns regarding your invoice.   Our billing staff will not be able to assist you with questions regarding bills from these companies.  You will be contacted  with the lab results as soon as they are available. The fastest way to get your results is to activate your My Chart account. Instructions are located on the last page of this paperwork. If you have not heard from [place] regarding the results in 2 weeks, please contact this office.

## 2020-01-24 NOTE — Telephone Encounter (Signed)
[facility] called to let provider know pt wanted her start of care be on 01/31/20 and it is sch for that date. Please advise

## 2020-01-24 NOTE — Telephone Encounter (Signed)
   [facility] Pre-operative Risk Assessment    Request for surgical clearance:  1. What type of surgery is being performed? RIGHT TOTAL KNEE REPLACEMENT   2. When is this surgery scheduled? TBD   3. What type of clearance is required (medical clearance vs. Pharmacy clearance to hold med vs. Both)? MEDICAL  4. Are there any medications that need to be held prior to surgery and how long? NONE LISTED   5. Practice name and name of physician performing surgery? [facility]; DR. Christia Reading [facility]URPHY    6. What is your office phone number [phone] EXT [phone]    7.   What is your office fax number [place]  8.   Anesthesia type (None, local, MAC, general) ? CHOICE   Julaine Hua 01/24/2020, 8:29 AM  _________________________________________________________________   (provider comments below)

## 2020-01-25 ENCOUNTER — Ambulatory Visit: Admit: 2020-01-25 | Payer: Medicare HMO | Admitting: Urology

## 2020-01-25 LAB — COMPREHENSIVE METABOLIC PANEL
ALT: 7 IU/L (ref 0–32)
AST: 13 IU/L (ref 0–40)
Albumin/Globulin Ratio: 1.3 (ref 1.2–2.2)
Albumin: 3.5 g/dL — ABNORMAL LOW (ref 3.8–4.8)
Alkaline Phosphatase: 55 IU/L (ref 39–117)
BUN/Creatinine Ratio: 14 (ref 12–28)
BUN: 29 mg/dL — ABNORMAL HIGH (ref 8–27)
Bilirubin Total: 0.4 mg/dL (ref 0.0–1.2)
CO2: 26 mmol/L (ref 20–29)
Calcium: 9.1 mg/dL (ref 8.7–10.3)
Chloride: 100 mmol/L (ref 96–106)
Creatinine, Ser: 2.07 mg/dL — ABNORMAL HIGH (ref 0.57–1.00)
GFR calc Af Amer: 28 mL/min/{1.73_m2} — ABNORMAL LOW (ref >59)
GFR calc non Af Amer: 24 mL/min/{1.73_m2} — ABNORMAL LOW (ref >59)
Globulin, Total: 2.6 g/dL (ref 1.5–4.5)
Glucose: 89 mg/dL (ref 65–99)
Potassium: 4.2 mmol/L (ref 3.5–5.2)
Sodium: 139 mmol/L (ref 134–144)
Total Protein: 6.1 g/dL (ref 6.0–8.5)

## 2020-01-25 LAB — CBC WITH DIFFERENTIAL/PLATELET
Basophils Absolute: 0 10*3/uL (ref 0.0–0.2)
Basos: 0 %
EOS (ABSOLUTE): 0.2 10*3/uL (ref 0.0–0.4)
Eos: 2 %
Hematocrit: 29.2 % — ABNORMAL LOW (ref 34.0–46.6)
Hemoglobin: 9.2 g/dL — ABNORMAL LOW (ref 11.1–15.9)
Immature Grans (Abs): 0.1 10*3/uL (ref 0.0–0.1)
Immature Granulocytes: 1 %
Lymphocytes Absolute: 0.8 10*3/uL (ref 0.7–3.1)
Lymphs: 11 %
MCH: 28.8 pg (ref 26.6–33.0)
MCHC: 31.5 g/dL (ref 31.5–35.7)
MCV: 91 fL (ref 79–97)
Monocytes Absolute: 0.5 10*3/uL (ref 0.1–0.9)
Monocytes: 7 %
Neutrophils Absolute: 5.7 10*3/uL (ref 1.4–7.0)
Neutrophils: 79 %
Platelets: 283 10*3/uL (ref 150–450)
RBC: 3.2 x10E6/uL — ABNORMAL LOW (ref 3.77–5.28)
RDW: 13.6 % (ref 11.7–15.4)
WBC: 7.2 10*3/uL (ref 3.4–10.8)

## 2020-01-25 LAB — VITAMIN D 25 HYDROXY (VIT D DEFICIENCY, FRACTURES): Vit D, 25-Hydroxy: 25.6 ng/mL — ABNORMAL LOW (ref 30.0–100.0)

## 2020-01-25 LAB — LIPID PANEL
Chol/HDL Ratio: 1.9 ratio (ref 0.0–4.4)
Cholesterol, Total: 136 mg/dL (ref 100–199)
HDL: 71 mg/dL (ref >39)
LDL Chol Calc (NIH): 51 mg/dL (ref 0–99)
Triglycerides: 71 mg/dL (ref 0–149)
VLDL Cholesterol Cal: 14 mg/dL (ref 5–40)

## 2020-01-25 LAB — THYROID PANEL
Free Thyroxine Index: 2.9 (ref 1.2–4.9)
T3 Uptake Ratio: 29 % (ref 24–39)
T4, Total: 10 ug/dL (ref 4.5–12.0)

## 2020-01-25 LAB — ANA: Anti Nuclear Antibody (ANA): POSITIVE — AB

## 2020-01-25 SURGERY — CYSTOSCOPY
Anesthesia: General

## 2020-01-26 ENCOUNTER — Other Ambulatory Visit (INDEPENDENT_AMBULATORY_CARE_PROVIDER_SITE_OTHER): Payer: Medicare HMO

## 2020-01-26 DIAGNOSIS — D62 Acute posthemorrhagic anemia: Secondary | ICD-10-CM | POA: Diagnosis not present

## 2020-01-26 LAB — CBC
HCT: 27.6 % — ABNORMAL LOW (ref 36.0–46.0)
Hemoglobin: 8.9 g/dL — ABNORMAL LOW (ref 12.0–15.0)
MCHC: 32.3 g/dL (ref 30.0–36.0)
MCV: 89.1 fl (ref 78.0–100.0)
Platelets: 289 10*3/uL (ref 150.0–400.0)
RBC: 3.1 Mil/uL — ABNORMAL LOW (ref 3.87–5.11)
RDW: 15.6 % — ABNORMAL HIGH (ref 11.5–15.5)
WBC: 7.5 10*3/uL (ref 4.0–10.5)

## 2020-01-26 NOTE — Telephone Encounter (Signed)
Repeat CBC with stable Hb/HCT from prior. Ok to restart eliquis.

## 2020-01-26 NOTE — Telephone Encounter (Signed)
Are you ok with pt holding Eliquis for 3 days prior to TKA with history of stroke, GI bleed and renal dysfunction?

## 2020-01-26 NOTE — Telephone Encounter (Signed)
Ok to hold Eliquis for procedure.

## 2020-01-27 ENCOUNTER — Other Ambulatory Visit: Payer: Self-pay | Admitting: Registered Nurse

## 2020-01-27 ENCOUNTER — Other Ambulatory Visit: Payer: Self-pay

## 2020-01-27 DIAGNOSIS — D62 Acute posthemorrhagic anemia: Secondary | ICD-10-CM

## 2020-01-27 DIAGNOSIS — R768 Other specified abnormal immunological findings in serum: Secondary | ICD-10-CM

## 2020-01-27 NOTE — Progress Notes (Signed)
Good evening,   If we could call Patricia Wagner and let her know her labs are back, that would be great.   For the most part, they're holding steady, and there are no acute concerns.  Her ANA was positive - I'll be placing a referral to rheumatology.  I received some pre-op paperwork. She will need to come in for a pre-op visit to do an EKG, A1c, and recheck some labs in 2-3 weeks to see if she is a candidate for surgery. Additionally, she will need clearance from cardiology.  Thank you  Kathrin Ruddy, NP

## 2020-01-27 NOTE — Telephone Encounter (Signed)
   Primary Cardiologist: Glori Bickers, MD  Chart reviewed as part of pre-operative protocol coverage. Patient was contacted 01/27/2020 in reference to pre-operative risk assessment for pending surgery as outlined below.  Patricia Wagner was last seen on 01/17/20 by Dr. Curt Bears.  Since that day, Patricia Wagner has done fine from a cardiac standpoint. She has no complaints of chest pain, SOB, or palpitations, however she is quite limited in mobility by knee pain/stiffness and is unable to complete 4 METs.  Will route to Drs. Bensimhon and Camnitz to weigh in of preoperative status. Patient was encouraged to call to schedule a follow-up visit with [name] as she is due for her 6 month follow-up appointment.   Per Dr. Curt Bears, patient can hold eliquis 3 days prior to her upcoming knee replacement and should restart when cleared to do so by Dr. Percell Miller.  Once I get clarification of whether additional appointments/testing are required prior to surgery, I will route to the requesting surgeons office.   Abigail Butts, PA-C 01/27/2020, 10:36 AM

## 2020-01-30 ENCOUNTER — Encounter: Payer: Self-pay | Admitting: Physician Assistant

## 2020-01-30 ENCOUNTER — Ambulatory Visit: Payer: Medicare HMO | Admitting: Physician Assistant

## 2020-01-30 ENCOUNTER — Telehealth: Payer: Self-pay | Admitting: Registered Nurse

## 2020-01-30 VITALS — BP 116/66 | HR 68 | Temp 97.6°F | Ht 65.0 in | Wt 212.0 lb

## 2020-01-30 DIAGNOSIS — D62 Acute posthemorrhagic anemia: Secondary | ICD-10-CM | POA: Diagnosis not present

## 2020-01-30 DIAGNOSIS — Z8719 Personal history of other diseases of the digestive system: Secondary | ICD-10-CM | POA: Diagnosis not present

## 2020-01-30 NOTE — Progress Notes (Signed)
Chief Complaint: Follow-up hospitalization for diverticular bleed  HPI:    Patricia Wagner is a 69 year old African-American female with past complicated medical history as listed below including A. fib on Eliquis, cardiomyopathy (EF 40-45%), CKD stage III, CVA and others, known to Dr. Fuller Plan, who presents clinic today for follow-up after recent hospitalization for diverticular bleed.    01/18/2020 patient consulted by our service for painless rectal bleeding.  At that time hemoglobin 11 from baseline 12-13, INR 1.6.  Tagged RBC scan showed bleeding in the left lower quadrant.  Flex sig was pursued as hemoglobin had dropped from 11-8.8.  01/18/2020 flex sig with active bleeding the diverticulum, successfully treated with clip placement and epinephrine.  Patient's hemoglobin dropped to 7.6, she was transfused 1 unit.  Was recommended that she hold off on Eliquis if that was okay for her.  Patient had no further bleeding was discharged.    01/26/2019 1 repeat CBC showed a hemoglobin of 8.9.  Repeat was recommended in 2 weeks by Dr. Fuller Plan to ensure that this was trending upwards.    Today, patient presents clinic and explains that she has had no further bleeding.  She never had any abdominal pain and is feeling well from a GI standpoint.  Tells me that cardiology never restarted her Eliquis.  She does have follow-up with them in a couple of weeks but she thinks the plan was to stay off of this.    Denies fever, chills, weight loss or change in bowel habits.     Past Medical History:  Diagnosis Date  . Acute blood loss anemia 03/05/2018  . Acute hypercapnic respiratory failure ([place]) 04/30/2017  . Acute renal failure superimposed on chronic kidney disease ([place]) 10/20/2016  . Arthritis   . Atrial fibrillation ([place])    a. s/p multiple cardioversions; failed tikosyn/sotalol.  . Bradycardia 11/20/2016  . CAD in native artery, s/p cardiac cath with non obstructive CAD 10/24/2016  . Cerebrovascular accident (CVA) due  to embolism of left cerebellar artery ([place])   . CHF (congestive heart failure) ([place])   . Chronic anticoagulation 03/15/2018  . Chronic atrial fibrillation ([place]) 03/05/2018  . Chronic systolic (congestive) heart failure ([place]) 03/05/2018  . CKD (chronic kidney disease) stage 3, GFR 30-59 ml/min   . Coagulopathy ([place]) 03/05/2018  . DIVERTICULITIS, HX OF 07/25/2007  . Diverticulosis of colon with hemorrhage 07/22/2007   Qualifier: Diagnosis of  By: [name]    . DIVERTICULOSIS, COLON 07/22/2007  . Dyspnea   . Dysuria 07/21/2017  . Edema, peripheral    a. chronic BLE edema, R>L. Prior trauma from dog attack and accident.  . Essential hypertension 07/22/2007   Qualifier: Diagnosis of  By: [name]    . HLD (hyperlipidemia) 02/03/2008   Qualifier: Diagnosis of  By: [name], [name]   . HYPERLIPIDEMIA 02/03/2008  . Hypersomnia    declines w/u  . Hypertension   . Hypotension (arterial) 04/30/2017  . MENOPAUSAL DISORDER 01/09/2011  . Morbid obesity ([place]) 07/22/2007  . NICM (nonischemic cardiomyopathy) ([place]) 10/24/2016  . PAF (paroxysmal atrial fibrillation) ([place])   . Raynaud's syndrome 07/22/2007  . Stroke ([place]) 2017  . Suspected sleep apnea 05/20/2017  . THYROID NODULE, RIGHT 01/04/2010  . VITAMIN D DEFICIENCY 01/09/2011   Qualifier: Diagnosis of  By: [name], [name]     Past Surgical History:  Procedure Laterality Date  . ATRIAL FIBRILLATION ABLATION N/A 10/13/2019   Procedure: ATRIAL FIBRILLATION ABLATION;  Surgeon: [name], MD;  Location: [place];  Service: Cardiovascular;  Laterality: N/A;  . CARDIAC CATHETERIZATION N/A 10/23/2016   Procedure: Left Heart Cath and Coronary Angiography;  Surgeon: [name], MD;  Location: [facility];  Service: Cardiovascular;  Laterality: N/A;  . CARDIOVERSION N/A 10/31/2016   Procedure: CARDIOVERSION;  Surgeon: [name], MD;  Location: [facility];  Service: Cardiovascular;  Laterality: N/A;  .  CARDIOVERSION N/A 11/03/2016   Procedure: CARDIOVERSION;  Surgeon: [name], MD;  Location: [facility];  Service: Cardiovascular;  Laterality: N/A;  . CARDIOVERSION N/A 11/18/2016   Procedure: CARDIOVERSION;  Surgeon: [name], MD;  Location: [facility];  Service: Cardiovascular;  Laterality: N/A;  . CARDIOVERSION N/A 03/25/2017   Procedure: CARDIOVERSION;  Surgeon: [name], MD;  Location: [facility] ENDOSCOPY;  Service: Cardiovascular;  Laterality: N/A;  . CARDIOVERSION N/A 05/06/2017   Procedure: CARDIOVERSION;  Surgeon: [name], MD;  Location: [facility] ENDOSCOPY;  Service: Cardiovascular;  Laterality: N/A;  . CARDIOVERSION N/A 05/12/2017   Procedure: CARDIOVERSION;  Surgeon: [name], MD;  Location: [facility] ENDOSCOPY;  Service: Cardiovascular;  Laterality: N/A;  . COLONOSCOPY N/A 12/04/2017   Procedure: COLONOSCOPY;  Surgeon: [name], MD;  Location: [facility];  Service: Endoscopy;  Laterality: N/A;  . COLONOSCOPY    . COLONOSCOPY W/ POLYPECTOMY  02/2011   pan diverticulosis.  tubular adenoma without dysplasia on 5 mm sigmoid polyp.  Dr Fuller Plan.    Marland Kitchen FLEXIBLE SIGMOIDOSCOPY N/A 01/18/2020   Procedure: FLEXIBLE SIGMOIDOSCOPY;  Surgeon: [name], DO;  Location: [facility];  Service: Gastroenterology;  Laterality: N/A;  . HEMOSTASIS CLIP PLACEMENT  01/18/2020   Procedure: HEMOSTASIS CLIP PLACEMENT;  Surgeon: [name], DO;  Location: [facility] ENDOSCOPY;  Service: Gastroenterology;;  . PARTIAL HYSTERECTOMY     1 OVARY LEFT  . SCLEROTHERAPY  01/18/2020   Procedure: SCLEROTHERAPY;  Surgeon: [name], DO;  Location: [facility] ENDOSCOPY;  Service: Gastroenterology;;  . TEE WITHOUT CARDIOVERSION N/A 10/31/2016   Procedure: TRANSESOPHAGEAL ECHOCARDIOGRAM (TEE);  Surgeon: [name], MD;  Location: [facility] ENDOSCOPY;  Service: Cardiovascular;  Laterality: N/A;  . TEE WITHOUT CARDIOVERSION N/A 03/25/2017   Procedure: TRANSESOPHAGEAL ECHOCARDIOGRAM (TEE);   Surgeon: [name], MD;  Location: [facility] ENDOSCOPY;  Service: Cardiovascular;  Laterality: N/A;    Current Outpatient Medications  Medication Sig Dispense Refill  . acetaminophen (TYLENOL) 325 MG tablet Take 2 tablets (650 mg total) by mouth every 4 (four) hours as needed for headache or mild pain.    . Cholecalciferol (VITAMIN D3) 25 MCG (1000 UT) CAPS Take 1,000 Units by mouth daily.    . furosemide (LASIX) 80 MG tablet TAKE 1 TABLET BY MOUTH EVERY DAY 90 tablet 3  . isosorbide mononitrate (IMDUR) 30 MG 24 hr tablet TAKE HALF A TABLET BY MOUTH EVERY DAY 45 tablet 1  . pantoprazole (PROTONIX) 40 MG tablet Take 1 tablet (40 mg total) by mouth daily. 30 tablet 0  . potassium chloride SA (K-DUR,KLOR-CON) 20 MEQ tablet Take 2 tablets (40 mEq total) by mouth daily. 180 tablet 3  . ranolazine (RANEXA) 500 MG 12 hr tablet TAKE 1 TABLET BY MOUTH TWICE A DAY 180 tablet 3  . rosuvastatin (CRESTOR) 10 MG tablet Take 1 tablet (10 mg total) by mouth daily. 90 tablet 3  . VOLTAREN 1 % GEL Apply 1 application topically 2 (two) times daily as needed (Knee pain).      No current facility-administered medications for this visit.  Allergies as of 01/30/2020 - Review Complete 01/30/2020  Allergen Reaction Noted  . Ace inhibitors Palpitations 03/20/2011    Family History  Problem Relation Age of Onset  . Asthma Mother   . Diabetes Father   . Heart disease Father        Died of presumed heart attack - 39s  . Lung disease Sister   . Heart disease Sister        Twin sister has heart issue, unclear what kind  . Thyroid disease Neg Hx   . Colon polyps Neg Hx   . Esophageal cancer Neg Hx   . Pancreatic cancer Neg Hx   . Stomach cancer Neg Hx     Social History   Socioeconomic History  . Marital status: Single    Spouse name: Not on file  . Number of children: 1  . Years of education: Not on file  . Highest education level: Not on file  Occupational History  . Occupation: Academic librarian     Tobacco Use  . Smoking status: Former Smoker    Packs/day: 0.50    Types: Cigarettes    Start date: 2019  . Smokeless tobacco: Never Used  Substance and Sexual Activity  . Alcohol use: Not Currently    Alcohol/week: 4.0 standard drinks    Types: 4 Cans of beer per week    Comment: Intermittent  . Drug use: No  . Sexual activity: Not on file  Other Topics Concern  . Not on file  Social History Narrative  . Not on file   Social Determinants of Health   Financial Resource Strain:   . Difficulty of Paying Living Expenses: Not on file  Food Insecurity:   . Worried About Charity fundraiser in the Last Year: Not on file  . Ran Out of Food in the Last Year: Not on file  Transportation Needs:   . Lack of Transportation (Medical): Not on file  . Lack of Transportation (Non-Medical): Not on file  Physical Activity:   . Days of Exercise per Week: Not on file  . Minutes of Exercise per Session: Not on file  Stress:   . Feeling of Stress : Not on file  Social Connections:   . Frequency of Communication with Friends and Family: Not on file  . Frequency of Social Gatherings with Friends and Family: Not on file  . Attends Religious Services: Not on file  . Active Member of Clubs or Organizations: Not on file  . Attends Archivist Meetings: Not on file  . Marital Status: Not on file  Intimate Partner Violence:   . Fear of Current or Ex-Partner: Not on file  . Emotionally Abused: Not on file  . Physically Abused: Not on file  . Sexually Abused: Not on file    Review of Systems:    Constitutional: No weight loss, fever or chills Cardiovascular: No chest pain Respiratory: No SOB Gastrointestinal: See HPI and otherwise negative   Physical Exam:  Vital signs: BP 116/66 (BP Location: Right Arm, Patient Position: Sitting, Cuff Size: Large)   Pulse 68   Temp 97.6 F (36.4 C)   Ht 5\' 5"  (1.651 m)   Wt 212 lb (96.2 kg)   SpO2 96%   BMI 35.28 kg/m   Constitutional:    Pleasant obese AA female appears to be in NAD, Well developed, Well nourished, alert and cooperative Respiratory: Respirations even and unlabored. Lungs clear to auscultation bilaterally.   No wheezes, crackles, or  rhonchi.  Cardiovascular: Normal S1, S2. No MRG. Regular rate and rhythm. No peripheral edema, cyanosis or pallor.  Gastrointestinal:  Soft, nondistended, nontender. No rebound or guarding. Normal bowel sounds. No appreciable masses or hepatomegaly. Rectal:  Not performed.  Msk:  Symmetrical without gross deformities. Without edema, no deformity or joint abnormality. +in wheelchair Psychiatric: Demonstrates good judgement and reason without abnormal affect or behaviors.  RELEVANT LABS AND IMAGING: CBC    Component Value Date/Time   WBC 7.5 01/26/2020 1421   RBC 3.10 (L) 01/26/2020 1421   HGB 8.9 Repeated and verified X2. (L) 01/26/2020 1421   HGB 9.2 (L) 01/24/2020 1216   HCT 27.6 (L) 01/26/2020 1421   HCT 29.2 (L) 01/24/2020 1216   PLT 289.0 01/26/2020 1421   PLT 283 01/24/2020 1216   MCV 89.1 01/26/2020 1421   MCV 91 01/24/2020 1216   MCH 28.8 01/24/2020 1216   MCH 29.1 01/20/2020 0200   MCHC 32.3 01/26/2020 1421   RDW 15.6 (H) 01/26/2020 1421   RDW 13.6 01/24/2020 1216   LYMPHSABS 0.8 01/24/2020 1216   MONOABS 0.6 01/18/2020 0732   EOSABS 0.2 01/24/2020 1216   BASOSABS 0.0 01/24/2020 1216    CMP     Component Value Date/Time   NA 139 01/24/2020 1216   K 4.2 01/24/2020 1216   CL 100 01/24/2020 1216   CO2 26 01/24/2020 1216   GLUCOSE 89 01/24/2020 1216   GLUCOSE 106 (H) 01/20/2020 0200   BUN 29 (H) 01/24/2020 1216   CREATININE 2.07 (H) 01/24/2020 1216   CREATININE 1.57 (H) 01/20/2017 1541   CALCIUM 9.1 01/24/2020 1216   PROT 6.1 01/24/2020 1216   ALBUMIN 3.5 (L) 01/24/2020 1216   AST 13 01/24/2020 1216   ALT 7 01/24/2020 1216   ALKPHOS 55 01/24/2020 1216   BILITOT 0.4 01/24/2020 1216   GFRNONAA 24 (L) 01/24/2020 1216   GFRAA 28 (L) 01/24/2020 1216     Assessment: 1.  History of diverticular bleed: With hospitalization in February, bleed found in diverticulum in the sigmoid colon which was clipped 2.  Anemia related to acute blood loss: From above, recent hemoglobin 8.9  Plan: 1.  Discussed with patient that we would like to recheck her hemoglobin one more time in 2 weeks to ensure this is trending upwards.  She verbalized understanding. 2.  Patient can resume her regular activities and diet. 3.  Patient to follow in clinic with [place] as needed in the future.  Ellouise Newer, PA-C [facility] 01/30/2020, 2:33 PM  Cc: No ref. provider found

## 2020-01-30 NOTE — Patient Instructions (Signed)
If you are age 67 or older, your body mass index should be between 23-30. Your Body mass index is 35.28 kg/m. If this is out of the aforementioned range listed, please consider follow up with your Primary Care Provider.  If you are age 58 or younger, your body mass index should be between 19-25. Your Body mass index is 35.28 kg/m. If this is out of the aformentioned range listed, please consider follow up with your Primary Care Provider.   Return to office in 2 weeks (02/13/20) for blood test.

## 2020-01-31 NOTE — Progress Notes (Signed)
Reviewed and agree with management plan.  Vidhi Delellis T. Giavanna Kang, MD FACG [facility]  

## 2020-01-31 NOTE — Telephone Encounter (Signed)
   Primary Cardiologist:Daniel Bensimhon, MD  Chart reviewed as part of pre-operative protocol coverage. Pre-op clearance already addressed by colleagues in earlier phone notes. To summarize recommendations:  -Patient was contacted 01/27/2020 in reference to pre-operative risk assessment for pending surgery as outlined below.  Patricia Wagner was last seen on 01/17/20 by Dr. Curt Bears.  Since that day, ADELLYN CAPEK has done fine from a cardiac standpoint. She has no complaints of chest pain, SOB, or palpitations, however she is quite limited in mobility by knee pain/stiffness and is unable to complete 4 METs. -Per Dr. Curt Bears, the patient is Intermediate risk for this intermediate risk procedure. OK to hold anticoagulation per protocol for spinal anesthesia. Patient can hold eliquis 3 days prior to her upcoming knee replacement and should restart when cleared to do so by Dr. Percell Miller.  Will route this bundled recommendation to requesting provider via Epic fax function. Please call with questions.  Daune Perch, NP 01/31/2020, 2:31 PM

## 2020-01-31 NOTE — Telephone Encounter (Signed)
Intermediate risk for this intermediate risk procedure. OK to hold anticoagulation per protocol for spinal anesthesia. Would restart as soon as possible post op.

## 2020-02-01 NOTE — Telephone Encounter (Signed)
Agreed.  Thank you

## 2020-02-06 ENCOUNTER — Other Ambulatory Visit: Payer: Self-pay

## 2020-02-06 ENCOUNTER — Encounter: Payer: Self-pay | Admitting: Registered Nurse

## 2020-02-06 ENCOUNTER — Ambulatory Visit (INDEPENDENT_AMBULATORY_CARE_PROVIDER_SITE_OTHER): Payer: Medicare HMO | Admitting: Registered Nurse

## 2020-02-06 VITALS — BP 150/79 | HR 73 | Temp 97.4°F | Ht 65.0 in

## 2020-02-06 DIAGNOSIS — Z01812 Encounter for preprocedural laboratory examination: Secondary | ICD-10-CM

## 2020-02-06 DIAGNOSIS — D649 Anemia, unspecified: Secondary | ICD-10-CM

## 2020-02-06 DIAGNOSIS — R197 Diarrhea, unspecified: Secondary | ICD-10-CM

## 2020-02-06 LAB — POCT GLYCOSYLATED HEMOGLOBIN (HGB A1C): Hemoglobin A1C: 5.6 % (ref 4.0–5.6)

## 2020-02-06 MED ORDER — AZITHROMYCIN 250 MG PO TABS: 1000.0000 mg | ORAL_TABLET | Freq: Once | ORAL | 0 refills | Status: AC

## 2020-02-06 NOTE — Progress Notes (Signed)
Established Patient Office Visit  Subjective:  Patient ID: Patricia Wagner, female    DOB: Jan 21, 1951  Age: 69 y.o. MRN: [id number]  CC:  Chief Complaint  Patient presents with  . Diarrhea    patient needs a surgical clearance for knee surgery . patient also having alot of diarrhea even when only drinking water and she can not move that fast so she needs something .    HPI [name] presents for pre operative clearance for TKA.   She feels well overall. Has a significant CV history which she has managed with cardiology [name].   Notes that she's having some diarrhea - been chronic over hte past few weeks - loose stools, not liquidy/watery, some mucus noted in stool. No abdominal pain or blood in stool. Diet: "I eat whatever I feel", inconsistent.  Otherwise feels fine.  Past Medical History:  Diagnosis Date  . Acute blood loss anemia 03/05/2018  . Acute hypercapnic respiratory failure ([name]) 04/30/2017  . Acute renal failure superimposed on chronic kidney disease ([name]) 10/20/2016  . Arthritis   . Atrial fibrillation ([place])    a. s/p multiple cardioversions; failed tikosyn/sotalol.  . Bradycardia 11/20/2016  . CAD in native artery, s/p cardiac cath with non obstructive CAD 10/24/2016  . Cerebrovascular accident (CVA) due to embolism of left cerebellar artery ([place])   . CHF (congestive heart failure) ([place])   . Chronic anticoagulation 03/15/2018  . Chronic atrial fibrillation ([place]) 03/05/2018  . Chronic systolic (congestive) heart failure ([place]) 03/05/2018  . CKD (chronic kidney disease) stage 3, GFR 30-59 ml/min   . Coagulopathy ([place]) 03/05/2018  . DIVERTICULITIS, HX OF 07/25/2007  . Diverticulosis of colon with hemorrhage 07/22/2007   Qualifier: Diagnosis of  By: [name]    . DIVERTICULOSIS, COLON 07/22/2007  . Dyspnea   . Dysuria 07/21/2017  . Edema, peripheral    a. chronic BLE edema, R>L. Prior trauma from dog attack and accident.  . Essential hypertension 07/22/2007    Qualifier: Diagnosis of  By: [name]    . HLD (hyperlipidemia) 02/03/2008   Qualifier: Diagnosis of  By: [name], [name]   . HYPERLIPIDEMIA 02/03/2008  . Hypersomnia    declines w/u  . Hypertension   . Hypotension (arterial) 04/30/2017  . MENOPAUSAL DISORDER 01/09/2011  . Morbid obesity ([place]) 07/22/2007  . NICM (nonischemic cardiomyopathy) ([place]) 10/24/2016  . PAF (paroxysmal atrial fibrillation) ([place])   . Raynaud's syndrome 07/22/2007  . Stroke ([place]) 2017  . Suspected sleep apnea 05/20/2017  . THYROID NODULE, RIGHT 01/04/2010  . VITAMIN D DEFICIENCY 01/09/2011   Qualifier: Diagnosis of  By: [name], [name]     Past Surgical History:  Procedure Laterality Date  . ATRIAL FIBRILLATION ABLATION N/A 10/13/2019   Procedure: ATRIAL FIBRILLATION ABLATION;  Surgeon: [name], MD;  Location: [place] CV LAB;  Service: Cardiovascular;  Laterality: N/A;  . CARDIAC CATHETERIZATION N/A 10/23/2016   Procedure: Left Heart Cath and Coronary Angiography;  Surgeon: [name], MD;  Location: [place] CV LAB;  Service: Cardiovascular;  Laterality: N/A;  . CARDIOVERSION N/A 10/31/2016   Procedure: CARDIOVERSION;  Surgeon: [name], MD;  Location: [facility] ENDOSCOPY;  Service: Cardiovascular;  Laterality: N/A;  . CARDIOVERSION N/A 11/03/2016   Procedure: CARDIOVERSION;  Surgeon: [name], MD;  Location: [place];  Service: Cardiovascular;  Laterality: N/A;  . CARDIOVERSION N/A 11/18/2016   Procedure: CARDIOVERSION;  Surgeon: [name], MD;  Location: [facility] ENDOSCOPY;  Service: Cardiovascular;  Laterality: N/A;  . CARDIOVERSION N/A 03/25/2017   Procedure: CARDIOVERSION;  Surgeon: [name], MD;  Location: [facility] ENDOSCOPY;  Service: Cardiovascular;  Laterality: N/A;  . CARDIOVERSION N/A 05/06/2017   Procedure: CARDIOVERSION;  Surgeon: [name], MD;  Location: [facility] ENDOSCOPY;  Service: Cardiovascular;  Laterality: N/A;  . CARDIOVERSION N/A 05/12/2017    Procedure: CARDIOVERSION;  Surgeon: [name], MD;  Location: [facility] ENDOSCOPY;  Service: Cardiovascular;  Laterality: N/A;  . COLONOSCOPY N/A 12/04/2017   Procedure: COLONOSCOPY;  Surgeon: [name], MD;  Location: [facility] ENDOSCOPY;  Service: Endoscopy;  Laterality: N/A;  . COLONOSCOPY    . COLONOSCOPY W/ POLYPECTOMY  02/2011   pan diverticulosis.  tubular adenoma without dysplasia on 5 mm sigmoid polyp.  Dr Fuller Plan.    Marland Kitchen FLEXIBLE SIGMOIDOSCOPY N/A 01/18/2020   Procedure: FLEXIBLE SIGMOIDOSCOPY;  Surgeon: [name], DO;  Location: [place];  Service: Gastroenterology;  Laterality: N/A;  . HEMOSTASIS CLIP PLACEMENT  01/18/2020   Procedure: HEMOSTASIS CLIP PLACEMENT;  Surgeon: [name], DO;  Location: [facility] ENDOSCOPY;  Service: Gastroenterology;;  . PARTIAL HYSTERECTOMY     1 OVARY LEFT  . SCLEROTHERAPY  01/18/2020   Procedure: SCLEROTHERAPY;  Surgeon: [name], DO;  Location: [place] ENDOSCOPY;  Service: Gastroenterology;;  . TEE WITHOUT CARDIOVERSION N/A 10/31/2016   Procedure: TRANSESOPHAGEAL ECHOCARDIOGRAM (TEE);  Surgeon: [name], MD;  Location: [facility] ENDOSCOPY;  Service: Cardiovascular;  Laterality: N/A;  . TEE WITHOUT CARDIOVERSION N/A 03/25/2017   Procedure: TRANSESOPHAGEAL ECHOCARDIOGRAM (TEE);  Surgeon: [name], MD;  Location: [facility] ENDOSCOPY;  Service: Cardiovascular;  Laterality: N/A;    Family History  Problem Relation Age of Onset  . Asthma Mother   . Diabetes Father   . Heart disease Father        Died of presumed heart attack - 66s  . Lung disease Sister   . Heart disease Sister        Twin sister has heart issue, unclear what kind  . Thyroid disease Neg Hx   . Colon polyps Neg Hx   . Esophageal cancer Neg Hx   . Pancreatic cancer Neg Hx   . Stomach cancer Neg Hx     Social History   Socioeconomic History  . Marital status: Single    Spouse name: Not on file  . Number of children: 1  . Years of education: Not on file  . Highest  education level: Not on file  Occupational History  . Occupation: Academic librarian   Tobacco Use  . Smoking status: Former Smoker    Packs/day: 0.50    Types: Cigarettes    Start date: 2019  . Smokeless tobacco: Never Used  Substance and Sexual Activity  . Alcohol use: Not Currently    Alcohol/week: 4.0 standard drinks    Types: 4 Cans of beer per week    Comment: Intermittent  . Drug use: No  . Sexual activity: Not on file  Other Topics Concern  . Not on file  Social History Narrative  . Not on file   Social Determinants of Health   Financial Resource Strain:   . Difficulty of Paying Living Expenses:   Food Insecurity:   . Worried About Charity fundraiser in the Last Year:   . Arboriculturist in the Last Year:   Transportation Needs:   . Film/video editor (Medical):   Marland Kitchen Lack of Transportation (Non-Medical):   Physical Activity:   .  Days of Exercise per Week:   . Minutes of Exercise per Session:   Stress:   . Feeling of Stress :   Social Connections:   . Frequency of Communication with Friends and Family:   . Frequency of Social Gatherings with Friends and Family:   . Attends Religious Services:   . Active Member of Clubs or Organizations:   . Attends Archivist Meetings:   Marland Kitchen Marital Status:   Intimate Partner Violence:   . Fear of Current or Ex-Partner:   . Emotionally Abused:   Marland Kitchen Physically Abused:   . Sexually Abused:     Outpatient Medications Prior to Visit  Medication Sig Dispense Refill  . acetaminophen (TYLENOL) 325 MG tablet Take 2 tablets (650 mg total) by mouth every 4 (four) hours as needed for headache or mild pain.    . Cholecalciferol (VITAMIN D3) 25 MCG (1000 UT) CAPS Take 1,000 Units by mouth daily.    . furosemide (LASIX) 80 MG tablet TAKE 1 TABLET BY MOUTH EVERY DAY 90 tablet 3  . isosorbide mononitrate (IMDUR) 30 MG 24 hr tablet TAKE HALF A TABLET BY MOUTH EVERY DAY 45 tablet 1  . pantoprazole (PROTONIX) 40 MG tablet Take 1 tablet  (40 mg total) by mouth daily. 30 tablet 0  . potassium chloride SA (K-DUR,KLOR-CON) 20 MEQ tablet Take 2 tablets (40 mEq total) by mouth daily. 180 tablet 3  . ranolazine (RANEXA) 500 MG 12 hr tablet TAKE 1 TABLET BY MOUTH TWICE A DAY 180 tablet 3  . rosuvastatin (CRESTOR) 10 MG tablet Take 1 tablet (10 mg total) by mouth daily. 90 tablet 3  . VOLTAREN 1 % GEL Apply 1 application topically 2 (two) times daily as needed (Knee pain).      No facility-administered medications prior to visit.    Allergies  Allergen Reactions  . Ace Inhibitors Palpitations    ROS Review of Systems  Constitutional: Negative.   HENT: Negative.   Eyes: Negative.   Respiratory: Negative.   Cardiovascular: Negative.   Gastrointestinal: Negative.   Endocrine: Negative.   Genitourinary: Negative.   Musculoskeletal: Negative.   Skin: Negative.   Allergic/Immunologic: Negative.   Neurological: Negative.   Hematological: Negative.   Psychiatric/Behavioral: Negative.   All other systems reviewed and are negative.     Objective:    Physical Exam  Constitutional: She is oriented to person, place, and time. She appears well-developed and well-nourished. No distress.  Cardiovascular: Normal rate, regular rhythm, normal heart sounds and intact distal pulses. Exam reveals no gallop and no friction rub.  No murmur heard. Pulmonary/Chest: Effort normal and breath sounds normal. No respiratory distress. She has no wheezes. She has no rales. She exhibits no tenderness.  Neurological: She is alert and oriented to person, place, and time.  Skin: Skin is warm and dry. No rash noted. She is not diaphoretic. No erythema. No pallor.  Psychiatric: She has a normal mood and affect. Her behavior is normal. Judgment and thought content normal.  Nursing note and vitals reviewed.   BP (!) 150/79   Pulse 73   Temp (!) 97.4 F (36.3 C) (Temporal)   Ht 5\' 5"  (1.651 m)   SpO2 95%   BMI 35.28 kg/m  Wt Readings from Last  3 Encounters:  01/30/20 212 lb (96.2 kg)  01/18/20 201 lb (91.2 kg)  01/17/20 201 lb (91.2 kg)     There are no preventive care reminders to display for this patient.  There are  no preventive care reminders to display for this patient.  Lab Results  Component Value Date   TSH 0.430 (L) 09/14/2019   Lab Results  Component Value Date   WBC 7.5 01/26/2020   HGB 8.9 Repeated and verified X2. (L) 01/26/2020   HCT 27.6 (L) 01/26/2020   MCV 89.1 01/26/2020   PLT 289.0 01/26/2020   Lab Results  Component Value Date   NA 139 01/24/2020   K 4.2 01/24/2020   CO2 26 01/24/2020   GLUCOSE 89 01/24/2020   BUN 29 (H) 01/24/2020   CREATININE 2.07 (H) 01/24/2020   BILITOT 0.4 01/24/2020   ALKPHOS 55 01/24/2020   AST 13 01/24/2020   ALT 7 01/24/2020   PROT 6.1 01/24/2020   ALBUMIN 3.5 (L) 01/24/2020   CALCIUM 9.1 01/24/2020   ANIONGAP 7 01/20/2020   GFR 28.69 (L) 04/15/2019   Lab Results  Component Value Date   CHOL 136 01/24/2020   Lab Results  Component Value Date   HDL 71 01/24/2020   Lab Results  Component Value Date   LDLCALC 51 01/24/2020   Lab Results  Component Value Date   TRIG 71 01/24/2020   Lab Results  Component Value Date   CHOLHDL 1.9 01/24/2020   Lab Results  Component Value Date   HGBA1C 5.6 02/06/2020      Assessment & Plan:   Problem List Items Addressed This Visit      Other   Diarrhea   Relevant Medications   azithromycin (ZITHROMAX) 250 MG tablet   Anemia - Primary   Relevant Orders   CBC with Differential   Iron, TIBC and Ferritin Panel    Other Visit Diagnoses    Encounter for pre-operative laboratory testing       Relevant Orders   EKG 12-Lead (Completed)   POCT glycosylated hemoglobin (Hb A1C) (Completed)   Comprehensive metabolic panel   CBC with Differential      Meds ordered this encounter  Medications  . azithromycin (ZITHROMAX) 250 MG tablet    Sig: Take 4 tablets (1,000 mg total) by mouth once for 1 dose.     Dispense:  4 tablet    Refill:  0    Order Specific Question:   Supervising Provider    Answer:   Forrest Moron [id number]    Follow-up: No follow-ups on file.   PLAN  Diarrhea of uncertain etiology - will try 1g azithromycin PO once for possible bacterial overgrowth given painless steatorrhea. Otherwise will refer to GI given patient's complex medical history  Will reach out to cardiology for clearance from cardiac perspective for surgery  Labs drawn for surgical clearance  Patient encouraged to call clinic with any questions, comments, or concerns.  Maximiano Coss, NP

## 2020-02-06 NOTE — Patient Instructions (Signed)
° ° ° °  If you have lab work done today you will be contacted with your lab results within the next 2 weeks.  If you have not heard from us then please contact us. The fastest way to get your results is to register for My Chart. ° ° °IF you received an x-ray today, you will receive an invoice from [facility]. Please contact [facility] at [phone] with questions or concerns regarding your invoice.  ° °IF you received labwork today, you will receive an invoice from [facility]. Please contact [facility] at [phone] with questions or concerns regarding your invoice.  ° °Our billing staff will not be able to assist you with questions regarding bills from these companies. ° °You will be contacted with the lab results as soon as they are available. The fastest way to get your results is to activate your My Chart account. Instructions are located on the last page of this paperwork. If you have not heard from us regarding the results in 2 weeks, please contact this office. °  ° ° ° °

## 2020-02-07 DIAGNOSIS — I5042 Chronic combined systolic (congestive) and diastolic (congestive) heart failure: Secondary | ICD-10-CM | POA: Diagnosis not present

## 2020-02-07 DIAGNOSIS — K5730 Diverticulosis of large intestine without perforation or abscess without bleeding: Secondary | ICD-10-CM | POA: Diagnosis not present

## 2020-02-07 DIAGNOSIS — I7300 Raynaud's syndrome without gangrene: Secondary | ICD-10-CM | POA: Diagnosis not present

## 2020-02-07 DIAGNOSIS — I130 Hypertensive heart and chronic kidney disease with heart failure and stage 1 through stage 4 chronic kidney disease, or unspecified chronic kidney disease: Secondary | ICD-10-CM | POA: Diagnosis not present

## 2020-02-07 DIAGNOSIS — M19041 Primary osteoarthritis, right hand: Secondary | ICD-10-CM | POA: Diagnosis not present

## 2020-02-07 DIAGNOSIS — N184 Chronic kidney disease, stage 4 (severe): Secondary | ICD-10-CM | POA: Diagnosis not present

## 2020-02-07 DIAGNOSIS — M19042 Primary osteoarthritis, left hand: Secondary | ICD-10-CM | POA: Diagnosis not present

## 2020-02-07 DIAGNOSIS — M170 Bilateral primary osteoarthritis of knee: Secondary | ICD-10-CM | POA: Diagnosis not present

## 2020-02-07 DIAGNOSIS — G8929 Other chronic pain: Secondary | ICD-10-CM | POA: Diagnosis not present

## 2020-02-08 ENCOUNTER — Telehealth: Payer: Self-pay | Admitting: Registered Nurse

## 2020-02-08 DIAGNOSIS — N189 Chronic kidney disease, unspecified: Secondary | ICD-10-CM | POA: Diagnosis not present

## 2020-02-08 DIAGNOSIS — N183 Chronic kidney disease, stage 3 unspecified: Secondary | ICD-10-CM | POA: Diagnosis not present

## 2020-02-08 LAB — CBC WITH DIFFERENTIAL/PLATELET
Basophils Absolute: 0 10*3/uL (ref 0.0–0.2)
Basos: 0 %
EOS (ABSOLUTE): 0.1 10*3/uL (ref 0.0–0.4)
Eos: 1 %
Hematocrit: 31.8 % — ABNORMAL LOW (ref 34.0–46.6)
Hemoglobin: 9.8 g/dL — ABNORMAL LOW (ref 11.1–15.9)
Immature Grans (Abs): 0.1 10*3/uL (ref 0.0–0.1)
Immature Granulocytes: 1 %
Lymphocytes Absolute: 0.7 10*3/uL (ref 0.7–3.1)
Lymphs: 9 %
MCH: 27.1 pg (ref 26.6–33.0)
MCHC: 30.8 g/dL — ABNORMAL LOW (ref 31.5–35.7)
MCV: 88 fL (ref 79–97)
Monocytes Absolute: 0.5 10*3/uL (ref 0.1–0.9)
Monocytes: 6 %
Neutrophils Absolute: 6.4 10*3/uL (ref 1.4–7.0)
Neutrophils: 83 %
Platelets: 317 10*3/uL (ref 150–450)
RBC: 3.62 x10E6/uL — ABNORMAL LOW (ref 3.77–5.28)
RDW: 14.1 % (ref 11.7–15.4)
WBC: 7.7 10*3/uL (ref 3.4–10.8)

## 2020-02-08 LAB — COMPREHENSIVE METABOLIC PANEL
ALT: 5 IU/L (ref 0–32)
AST: 15 IU/L (ref 0–40)
Albumin/Globulin Ratio: 1.3 (ref 1.2–2.2)
Albumin: 3.8 g/dL (ref 3.8–4.8)
Alkaline Phosphatase: 67 IU/L (ref 39–117)
BUN/Creatinine Ratio: 13 (ref 12–28)
BUN: 26 mg/dL (ref 8–27)
Bilirubin Total: 0.5 mg/dL (ref 0.0–1.2)
CO2: 24 mmol/L (ref 20–29)
Calcium: 9.5 mg/dL (ref 8.7–10.3)
Chloride: 100 mmol/L (ref 96–106)
Creatinine, Ser: 1.94 mg/dL — ABNORMAL HIGH (ref 0.57–1.00)
GFR calc Af Amer: 30 mL/min/{1.73_m2} — ABNORMAL LOW (ref >59)
GFR calc non Af Amer: 26 mL/min/{1.73_m2} — ABNORMAL LOW (ref >59)
Globulin, Total: 2.9 g/dL (ref 1.5–4.5)
Glucose: 83 mg/dL (ref 65–99)
Potassium: 5.3 mmol/L — ABNORMAL HIGH (ref 3.5–5.2)
Sodium: 137 mmol/L (ref 134–144)
Total Protein: 6.7 g/dL (ref 6.0–8.5)

## 2020-02-08 LAB — IRON,TIBC AND FERRITIN PANEL
Ferritin: 19 ng/mL (ref 15–150)
Iron Saturation: 7 % — CL (ref 15–55)
Iron: 22 ug/dL — ABNORMAL LOW (ref 27–139)
Total Iron Binding Capacity: 322 ug/dL (ref 250–450)
UIBC: 300 ug/dL (ref 118–369)

## 2020-02-08 NOTE — Telephone Encounter (Signed)
Verbal Order Request  Name of [address]: [facility]   Name of [place]  PCP Doctor: [place]:  02/06/2020  What Orders are being requested:  approval for home health physical therapy  1x for 4weeks

## 2020-02-08 NOTE — Telephone Encounter (Signed)
Called Gracee and ok the verbal orders.

## 2020-02-09 DIAGNOSIS — G8929 Other chronic pain: Secondary | ICD-10-CM | POA: Diagnosis not present

## 2020-02-09 DIAGNOSIS — M170 Bilateral primary osteoarthritis of knee: Secondary | ICD-10-CM | POA: Diagnosis not present

## 2020-02-09 DIAGNOSIS — K5730 Diverticulosis of large intestine without perforation or abscess without bleeding: Secondary | ICD-10-CM | POA: Diagnosis not present

## 2020-02-09 DIAGNOSIS — M19042 Primary osteoarthritis, left hand: Secondary | ICD-10-CM | POA: Diagnosis not present

## 2020-02-09 DIAGNOSIS — N184 Chronic kidney disease, stage 4 (severe): Secondary | ICD-10-CM | POA: Diagnosis not present

## 2020-02-09 DIAGNOSIS — I7300 Raynaud's syndrome without gangrene: Secondary | ICD-10-CM | POA: Diagnosis not present

## 2020-02-09 DIAGNOSIS — I130 Hypertensive heart and chronic kidney disease with heart failure and stage 1 through stage 4 chronic kidney disease, or unspecified chronic kidney disease: Secondary | ICD-10-CM | POA: Diagnosis not present

## 2020-02-09 DIAGNOSIS — I5042 Chronic combined systolic (congestive) and diastolic (congestive) heart failure: Secondary | ICD-10-CM | POA: Diagnosis not present

## 2020-02-09 DIAGNOSIS — M19041 Primary osteoarthritis, right hand: Secondary | ICD-10-CM | POA: Diagnosis not present

## 2020-02-14 ENCOUNTER — Other Ambulatory Visit: Payer: Self-pay | Admitting: Registered Nurse

## 2020-02-14 DIAGNOSIS — D649 Anemia, unspecified: Secondary | ICD-10-CM

## 2020-02-14 NOTE — Progress Notes (Signed)
Good evening,  If we could call Patricia Wagner - she is still showing significant drop in her blood counts - I want to refer her to a hematologist to address this further, given her history of kidney dysfunction.  Thank you,  Kathrin Ruddy, NP

## 2020-02-15 DIAGNOSIS — M1711 Unilateral primary osteoarthritis, right knee: Secondary | ICD-10-CM | POA: Diagnosis not present

## 2020-02-16 DIAGNOSIS — M19042 Primary osteoarthritis, left hand: Secondary | ICD-10-CM | POA: Diagnosis not present

## 2020-02-16 DIAGNOSIS — I130 Hypertensive heart and chronic kidney disease with heart failure and stage 1 through stage 4 chronic kidney disease, or unspecified chronic kidney disease: Secondary | ICD-10-CM | POA: Diagnosis not present

## 2020-02-16 DIAGNOSIS — M170 Bilateral primary osteoarthritis of knee: Secondary | ICD-10-CM | POA: Diagnosis not present

## 2020-02-16 DIAGNOSIS — N184 Chronic kidney disease, stage 4 (severe): Secondary | ICD-10-CM | POA: Diagnosis not present

## 2020-02-16 DIAGNOSIS — K5730 Diverticulosis of large intestine without perforation or abscess without bleeding: Secondary | ICD-10-CM | POA: Diagnosis not present

## 2020-02-16 DIAGNOSIS — G8929 Other chronic pain: Secondary | ICD-10-CM | POA: Diagnosis not present

## 2020-02-16 DIAGNOSIS — M19041 Primary osteoarthritis, right hand: Secondary | ICD-10-CM | POA: Diagnosis not present

## 2020-02-16 DIAGNOSIS — I7300 Raynaud's syndrome without gangrene: Secondary | ICD-10-CM | POA: Diagnosis not present

## 2020-02-16 DIAGNOSIS — I5042 Chronic combined systolic (congestive) and diastolic (congestive) heart failure: Secondary | ICD-10-CM | POA: Diagnosis not present

## 2020-02-16 NOTE — Progress Notes (Signed)
Left message

## 2020-02-17 ENCOUNTER — Other Ambulatory Visit (HOSPITAL_COMMUNITY): Payer: Self-pay

## 2020-02-17 NOTE — Discharge Instructions (Signed)

## 2020-02-20 ENCOUNTER — Encounter (HOSPITAL_COMMUNITY)
Admission: RE | Admit: 2020-02-20 | Discharge: 2020-02-20 | Disposition: A | Discharge status: Home / Self Care | Payer: Medicare HMO | Source: Ambulatory Visit | Attending: Nephrology | Admitting: Nephrology

## 2020-02-20 ENCOUNTER — Other Ambulatory Visit: Payer: Self-pay

## 2020-02-20 DIAGNOSIS — D631 Anemia in chronic kidney disease: Secondary | ICD-10-CM | POA: Insufficient documentation

## 2020-02-20 DIAGNOSIS — N189 Chronic kidney disease, unspecified: Secondary | ICD-10-CM | POA: Insufficient documentation

## 2020-02-20 MED ORDER — SODIUM CHLORIDE 0.9 % IV SOLN
510.0000 mg | INTRAVENOUS | Status: DC
  Administered 2020-02-20: 510 mg via INTRAVENOUS
  Filled 2020-02-20: qty 17

## 2020-02-21 DIAGNOSIS — R338 Other retention of urine: Secondary | ICD-10-CM | POA: Diagnosis not present

## 2020-02-22 ENCOUNTER — Telehealth: Payer: Self-pay | Admitting: *Deleted

## 2020-02-22 DIAGNOSIS — M19042 Primary osteoarthritis, left hand: Secondary | ICD-10-CM | POA: Diagnosis not present

## 2020-02-22 DIAGNOSIS — M170 Bilateral primary osteoarthritis of knee: Secondary | ICD-10-CM | POA: Diagnosis not present

## 2020-02-22 DIAGNOSIS — I7300 Raynaud's syndrome without gangrene: Secondary | ICD-10-CM | POA: Diagnosis not present

## 2020-02-22 DIAGNOSIS — N184 Chronic kidney disease, stage 4 (severe): Secondary | ICD-10-CM | POA: Diagnosis not present

## 2020-02-22 DIAGNOSIS — I5042 Chronic combined systolic (congestive) and diastolic (congestive) heart failure: Secondary | ICD-10-CM | POA: Diagnosis not present

## 2020-02-22 DIAGNOSIS — I130 Hypertensive heart and chronic kidney disease with heart failure and stage 1 through stage 4 chronic kidney disease, or unspecified chronic kidney disease: Secondary | ICD-10-CM | POA: Diagnosis not present

## 2020-02-22 DIAGNOSIS — G8929 Other chronic pain: Secondary | ICD-10-CM | POA: Diagnosis not present

## 2020-02-22 DIAGNOSIS — K5730 Diverticulosis of large intestine without perforation or abscess without bleeding: Secondary | ICD-10-CM | POA: Diagnosis not present

## 2020-02-22 DIAGNOSIS — M19041 Primary osteoarthritis, right hand: Secondary | ICD-10-CM | POA: Diagnosis not present

## 2020-02-22 NOTE — Telephone Encounter (Signed)
-----   Message from Will Meredith Leeds, MD sent at 02/20/2020  3:41 PM EDT ----- Regarding: FW: PENDING SURGERY Restart anticoagulatoin ----- Message ----- From: Levin Erp, PA Sent: 02/15/2020   3:50 PM EDT To: [name], MD Subject: RE: PENDING SURGERY                            If cardiology needs her back on this medicine it can be restarted now.  Ellouise Newer, PA-C ----- Message ----- From: [name], MD Sent: 02/15/2020   3:26 PM EDT To: Nickola Major, Levin Erp, [place], # Subject: RE: PENDING SURGERY                            Once she has recovered from her GI bleeding issues she will need lifelong anticoagulation with eliquis. ----- Message ----- From: Nickola Major Sent: 02/15/2020   3:01 PM EDT To: [name], MD, # Subject: PENDING SURGERY                                Although we have received clearances, patient has not resumed chronic anticoag for afib. Recent lower GI bleed. Is resuming Eliquis/anticoag appropriate from a GI perspective? Patient is SP ablation. Is anticoagulation needed from a cardiac perspective? Patient is currently not taking Eliquis or ASA.  Thank you, Claiborne Billings (surgery coordinator)

## 2020-02-22 NOTE — Telephone Encounter (Signed)
lmtcb

## 2020-02-22 NOTE — Telephone Encounter (Signed)
Camnitz, Ocie Doyne, MD  Stanton Kidney, RN  Restart anticoagulatoin

## 2020-02-27 ENCOUNTER — Encounter (HOSPITAL_COMMUNITY)
Admission: RE | Admit: 2020-02-27 | Discharge: 2020-02-27 | Disposition: A | Discharge status: Home / Self Care | Payer: Medicare HMO | Source: Ambulatory Visit | Attending: Nephrology | Admitting: Nephrology

## 2020-02-27 ENCOUNTER — Encounter (HOSPITAL_COMMUNITY): Payer: Medicare HMO

## 2020-02-27 ENCOUNTER — Other Ambulatory Visit: Payer: Self-pay

## 2020-02-27 DIAGNOSIS — N189 Chronic kidney disease, unspecified: Secondary | ICD-10-CM | POA: Diagnosis not present

## 2020-02-27 DIAGNOSIS — D631 Anemia in chronic kidney disease: Secondary | ICD-10-CM | POA: Diagnosis not present

## 2020-02-27 MED ORDER — SODIUM CHLORIDE 0.9 % IV SOLN
510.0000 mg | INTRAVENOUS | Status: DC
  Administered 2020-02-27: 510 mg via INTRAVENOUS
  Filled 2020-02-27: qty 17

## 2020-02-27 NOTE — Telephone Encounter (Signed)
Patricia Wagner is returning [employer]. Please advise.

## 2020-02-27 NOTE — Telephone Encounter (Signed)
Left message asking pt to call and let us know if she restarted her blood thinner.

## 2020-02-28 DIAGNOSIS — M19042 Primary osteoarthritis, left hand: Secondary | ICD-10-CM | POA: Diagnosis not present

## 2020-02-28 DIAGNOSIS — I7300 Raynaud's syndrome without gangrene: Secondary | ICD-10-CM | POA: Diagnosis not present

## 2020-02-28 DIAGNOSIS — I5042 Chronic combined systolic (congestive) and diastolic (congestive) heart failure: Secondary | ICD-10-CM | POA: Diagnosis not present

## 2020-02-28 DIAGNOSIS — N184 Chronic kidney disease, stage 4 (severe): Secondary | ICD-10-CM | POA: Diagnosis not present

## 2020-02-28 DIAGNOSIS — M170 Bilateral primary osteoarthritis of knee: Secondary | ICD-10-CM | POA: Diagnosis not present

## 2020-02-28 DIAGNOSIS — K5730 Diverticulosis of large intestine without perforation or abscess without bleeding: Secondary | ICD-10-CM | POA: Diagnosis not present

## 2020-02-28 DIAGNOSIS — M19041 Primary osteoarthritis, right hand: Secondary | ICD-10-CM | POA: Diagnosis not present

## 2020-02-28 DIAGNOSIS — G8929 Other chronic pain: Secondary | ICD-10-CM | POA: Diagnosis not present

## 2020-02-28 DIAGNOSIS — I130 Hypertensive heart and chronic kidney disease with heart failure and stage 1 through stage 4 chronic kidney disease, or unspecified chronic kidney disease: Secondary | ICD-10-CM | POA: Diagnosis not present

## 2020-02-29 ENCOUNTER — Other Ambulatory Visit (HOSPITAL_COMMUNITY): Payer: Self-pay | Admitting: Internal Medicine

## 2020-02-29 DIAGNOSIS — N184 Chronic kidney disease, stage 4 (severe): Secondary | ICD-10-CM | POA: Diagnosis not present

## 2020-02-29 DIAGNOSIS — I130 Hypertensive heart and chronic kidney disease with heart failure and stage 1 through stage 4 chronic kidney disease, or unspecified chronic kidney disease: Secondary | ICD-10-CM | POA: Diagnosis not present

## 2020-02-29 DIAGNOSIS — M19041 Primary osteoarthritis, right hand: Secondary | ICD-10-CM | POA: Diagnosis not present

## 2020-02-29 DIAGNOSIS — M170 Bilateral primary osteoarthritis of knee: Secondary | ICD-10-CM | POA: Diagnosis not present

## 2020-02-29 DIAGNOSIS — G8929 Other chronic pain: Secondary | ICD-10-CM | POA: Diagnosis not present

## 2020-02-29 DIAGNOSIS — I7300 Raynaud's syndrome without gangrene: Secondary | ICD-10-CM | POA: Diagnosis not present

## 2020-02-29 DIAGNOSIS — M19042 Primary osteoarthritis, left hand: Secondary | ICD-10-CM | POA: Diagnosis not present

## 2020-02-29 DIAGNOSIS — K5730 Diverticulosis of large intestine without perforation or abscess without bleeding: Secondary | ICD-10-CM | POA: Diagnosis not present

## 2020-02-29 DIAGNOSIS — I5042 Chronic combined systolic (congestive) and diastolic (congestive) heart failure: Secondary | ICD-10-CM | POA: Diagnosis not present

## 2020-03-01 NOTE — Telephone Encounter (Signed)
lmtcb

## 2020-03-05 ENCOUNTER — Other Ambulatory Visit: Payer: Self-pay

## 2020-03-05 ENCOUNTER — Encounter: Payer: Self-pay | Admitting: Registered Nurse

## 2020-03-05 ENCOUNTER — Ambulatory Visit (INDEPENDENT_AMBULATORY_CARE_PROVIDER_SITE_OTHER): Payer: Medicare HMO | Admitting: Registered Nurse

## 2020-03-05 ENCOUNTER — Other Ambulatory Visit: Payer: Self-pay | Admitting: Registered Nurse

## 2020-03-05 ENCOUNTER — Telehealth: Payer: Self-pay | Admitting: Registered Nurse

## 2020-03-05 VITALS — BP 112/67 | HR 62 | Temp 98.0°F | Resp 16 | Ht 65.0 in | Wt 200.0 lb

## 2020-03-05 DIAGNOSIS — Z01812 Encounter for preprocedural laboratory examination: Secondary | ICD-10-CM

## 2020-03-05 DIAGNOSIS — M1990 Unspecified osteoarthritis, unspecified site: Secondary | ICD-10-CM

## 2020-03-05 MED ORDER — APIXABAN 5 MG PO TABS: 5.0000 mg | ORAL_TABLET | Freq: Two times a day (BID) | ORAL | 11 refills | Status: DC

## 2020-03-05 NOTE — Telephone Encounter (Signed)
Home Health Verbal Orders - Caller/Agency: [facility]  Callback Number: [phone] Requesting OT/PT/Skilled Nursing/Social Work/Speech Therapy: FYI pt missed PT on 02/22/2020.  Pt was unable to be reached, pt had phone complications. Has since been discharged, pt's request.

## 2020-03-05 NOTE — Telephone Encounter (Signed)
Patient returned call stating she has started taking her blood thinners. She stated she started taking them on Monday 02/27/2020

## 2020-03-05 NOTE — Patient Instructions (Signed)
° ° ° °  If you have lab work done today you will be contacted with your lab results within the next 2 weeks.  If you have not heard from us then please contact us. The fastest way to get your results is to register for My Chart. ° ° °IF you received an x-ray today, you will receive an invoice from [facility]. Please contact [facility] at [phone] with questions or concerns regarding your invoice.  ° °IF you received labwork today, you will receive an invoice from [facility]. Please contact [facility] at [phone] with questions or concerns regarding your invoice.  ° °Our billing staff will not be able to assist you with questions regarding bills from these companies. ° °You will be contacted with the lab results as soon as they are available. The fastest way to get your results is to activate your My Chart account. Instructions are located on the last page of this paperwork. If you have not heard from us regarding the results in 2 weeks, please contact this office. °  ° ° ° °

## 2020-03-05 NOTE — Telephone Encounter (Signed)
Medication added back to pt medication list.

## 2020-03-05 NOTE — Telephone Encounter (Signed)
FYI patient has been discharged from PT due to missing an appointment and they requested termination

## 2020-03-06 LAB — COMPREHENSIVE METABOLIC PANEL
ALT: 4 IU/L (ref 0–32)
AST: 8 IU/L (ref 0–40)
Albumin/Globulin Ratio: 1.1 — ABNORMAL LOW (ref 1.2–2.2)
Albumin: 3.5 g/dL — ABNORMAL LOW (ref 3.8–4.8)
Alkaline Phosphatase: 61 IU/L (ref 39–117)
BUN/Creatinine Ratio: 10 — ABNORMAL LOW (ref 12–28)
BUN: 16 mg/dL (ref 8–27)
Bilirubin Total: 0.5 mg/dL (ref 0.0–1.2)
CO2: 20 mmol/L (ref 20–29)
Calcium: 9.6 mg/dL (ref 8.7–10.3)
Chloride: 107 mmol/L — ABNORMAL HIGH (ref 96–106)
Creatinine, Ser: 1.54 mg/dL — ABNORMAL HIGH (ref 0.57–1.00)
GFR calc Af Amer: 40 mL/min/{1.73_m2} — ABNORMAL LOW (ref >59)
GFR calc non Af Amer: 35 mL/min/{1.73_m2} — ABNORMAL LOW (ref >59)
Globulin, Total: 3.1 g/dL (ref 1.5–4.5)
Glucose: 91 mg/dL (ref 65–99)
Potassium: 5 mmol/L (ref 3.5–5.2)
Sodium: 142 mmol/L (ref 134–144)
Total Protein: 6.6 g/dL (ref 6.0–8.5)

## 2020-03-06 LAB — CBC WITH DIFFERENTIAL/PLATELET
Basophils Absolute: 0 10*3/uL (ref 0.0–0.2)
Basos: 1 %
EOS (ABSOLUTE): 0.1 10*3/uL (ref 0.0–0.4)
Eos: 2 %
Hematocrit: 37.2 % (ref 34.0–46.6)
Hemoglobin: 11.4 g/dL (ref 11.1–15.9)
Immature Grans (Abs): 0 10*3/uL (ref 0.0–0.1)
Immature Granulocytes: 1 %
Lymphocytes Absolute: 0.6 10*3/uL — ABNORMAL LOW (ref 0.7–3.1)
Lymphs: 9 %
MCH: 27.4 pg (ref 26.6–33.0)
MCHC: 30.6 g/dL — ABNORMAL LOW (ref 31.5–35.7)
MCV: 89 fL (ref 79–97)
Monocytes Absolute: 0.4 10*3/uL (ref 0.1–0.9)
Monocytes: 6 %
Neutrophils Absolute: 5.2 10*3/uL (ref 1.4–7.0)
Neutrophils: 81 %
Platelets: 247 10*3/uL (ref 150–450)
RBC: 4.16 x10E6/uL (ref 3.77–5.28)
RDW: 17.8 % — ABNORMAL HIGH (ref 11.7–15.4)
WBC: 6.4 10*3/uL (ref 3.4–10.8)

## 2020-03-06 LAB — HEMOGLOBIN A1C
Est. average glucose Bld gHb Est-mCnc: 88 mg/dL
Hgb A1c MFr Bld: 4.7 % — ABNORMAL LOW (ref 4.8–5.6)

## 2020-03-06 LAB — PROTIME-INR
INR: 1.1 (ref 0.9–1.2)
Prothrombin Time: 11.3 s (ref 9.1–12.0)

## 2020-03-06 LAB — TSH: TSH: 0.291 u[IU]/mL — ABNORMAL LOW (ref 0.450–4.500)

## 2020-03-06 NOTE — Telephone Encounter (Signed)
Pt called to report that she does not have her medication that was discussed in yesterday's appt. Please advise

## 2020-03-06 NOTE — Telephone Encounter (Signed)
Spoke to Kathrin Ruddy, NP he will refill Tramadol medication.

## 2020-03-07 DIAGNOSIS — M170 Bilateral primary osteoarthritis of knee: Secondary | ICD-10-CM | POA: Diagnosis not present

## 2020-03-07 DIAGNOSIS — K5730 Diverticulosis of large intestine without perforation or abscess without bleeding: Secondary | ICD-10-CM | POA: Diagnosis not present

## 2020-03-07 DIAGNOSIS — G8929 Other chronic pain: Secondary | ICD-10-CM | POA: Diagnosis not present

## 2020-03-07 DIAGNOSIS — I130 Hypertensive heart and chronic kidney disease with heart failure and stage 1 through stage 4 chronic kidney disease, or unspecified chronic kidney disease: Secondary | ICD-10-CM | POA: Diagnosis not present

## 2020-03-07 DIAGNOSIS — I5042 Chronic combined systolic (congestive) and diastolic (congestive) heart failure: Secondary | ICD-10-CM | POA: Diagnosis not present

## 2020-03-07 DIAGNOSIS — N184 Chronic kidney disease, stage 4 (severe): Secondary | ICD-10-CM | POA: Diagnosis not present

## 2020-03-07 DIAGNOSIS — M19042 Primary osteoarthritis, left hand: Secondary | ICD-10-CM | POA: Diagnosis not present

## 2020-03-07 DIAGNOSIS — I7300 Raynaud's syndrome without gangrene: Secondary | ICD-10-CM | POA: Diagnosis not present

## 2020-03-07 DIAGNOSIS — M19041 Primary osteoarthritis, right hand: Secondary | ICD-10-CM | POA: Diagnosis not present

## 2020-03-07 NOTE — Telephone Encounter (Signed)
Spoke to patient about refill request for Tramadol, per patient the pharmacy called her.

## 2020-03-14 DIAGNOSIS — N189 Chronic kidney disease, unspecified: Secondary | ICD-10-CM | POA: Diagnosis not present

## 2020-03-14 DIAGNOSIS — N183 Chronic kidney disease, stage 3 unspecified: Secondary | ICD-10-CM | POA: Diagnosis not present

## 2020-03-19 DIAGNOSIS — M170 Bilateral primary osteoarthritis of knee: Secondary | ICD-10-CM | POA: Diagnosis not present

## 2020-03-20 DIAGNOSIS — R338 Other retention of urine: Secondary | ICD-10-CM | POA: Diagnosis not present

## 2020-03-20 DIAGNOSIS — Z8719 Personal history of other diseases of the digestive system: Secondary | ICD-10-CM

## 2020-03-20 DIAGNOSIS — M1711 Unilateral primary osteoarthritis, right knee: Secondary | ICD-10-CM | POA: Diagnosis present

## 2020-03-20 NOTE — H&P (Signed)
KNEE ARTHROPLASTY ADMISSION H&P  Patient ID: Patricia Wagner MRN: [id number] DOB/AGE: 1951/11/21 69 y.o.  Chief Complaint: right knee pain.  Planned Procedure Date: 04/10/20 Medical Clearance by Maximiano Coss, NP   Cardiac Clearance by Dr. Nadene Rubins Additional clearance by GI: Charlton Haws, PA-C   HPI: Patricia Wagner is a 69 y.o. female with a history of lower GI bleed, A. fib on Eliquis, HTN, HLD, CKD who presents for evaluation of OA RIGHT KNEE. The patient has a history of pain and functional disability in the right knee due to arthritis and has failed non-surgical conservative treatments for greater than 12 weeks to include NSAID's and/or analgesics, corticosteriod injections, use of assistive devices and activity modification.  Onset of symptoms was gradual, starting 5 + years ago with gradually worsening course since that time.  Patient currently rates pain at 9 out of 10 with activity. Patient has night pain, worsening of pain with activity and weight bearing and pain that interferes with activities of daily living.  Patient has evidence of subchondral sclerosis, periarticular osteophytes and joint space narrowing by imaging studies.  There is no active infection.  Past Medical History:  Diagnosis Date  . Acute blood loss anemia 03/05/2018  . Acute hypercapnic respiratory failure ([place]) 04/30/2017  . Acute renal failure superimposed on chronic kidney disease ([place]) 10/20/2016  . Arthritis   . Atrial fibrillation ([place])    a. s/p multiple cardioversions; failed tikosyn/sotalol.  . Bradycardia 11/20/2016  . CAD in native artery, s/p cardiac cath with non obstructive CAD 10/24/2016  . Cerebrovascular accident (CVA) due to embolism of left cerebellar artery ([place])   . CHF (congestive heart failure) ([place])   . Chronic anticoagulation 03/15/2018  . Chronic atrial fibrillation ([place]) 03/05/2018  . Chronic systolic (congestive) heart failure ([place]) 03/05/2018  . CKD (chronic kidney disease) stage 3, GFR  30-59 ml/min   . Coagulopathy ([place]) 03/05/2018  . DIVERTICULITIS, HX OF 07/25/2007  . Diverticulosis of colon with hemorrhage 07/22/2007   Qualifier: Diagnosis of  By: [name]    . DIVERTICULOSIS, COLON 07/22/2007  . Dyspnea   . Dysuria 07/21/2017  . Edema, peripheral    a. chronic BLE edema, R>L. Prior trauma from dog attack and accident.  . Essential hypertension 07/22/2007   Qualifier: Diagnosis of  By: [name]    . HLD (hyperlipidemia) 02/03/2008   Qualifier: Diagnosis of  By: [name], [name]   . HYPERLIPIDEMIA 02/03/2008  . Hypersomnia    declines w/u  . Hypertension   . Hypotension (arterial) 04/30/2017  . MENOPAUSAL DISORDER 01/09/2011  . Morbid obesity ([place]) 07/22/2007  . NICM (nonischemic cardiomyopathy) ([place]) 10/24/2016  . PAF (paroxysmal atrial fibrillation) ([place])   . Raynaud's syndrome 07/22/2007  . Stroke ([place]) 2017  . Suspected sleep apnea 05/20/2017  . THYROID NODULE, RIGHT 01/04/2010  . VITAMIN D DEFICIENCY 01/09/2011   Qualifier: Diagnosis of  By: [name], [name]    Past Surgical History:  Procedure Laterality Date  . ATRIAL FIBRILLATION ABLATION N/A 10/13/2019   Procedure: ATRIAL FIBRILLATION ABLATION;  Surgeon: [name], MD;  Location: [place] [facility];  Service: Cardiovascular;  Laterality: N/A;  . CARDIAC CATHETERIZATION N/A 10/23/2016   Procedure: Left Heart Cath and Coronary Angiography;  Surgeon: [name], MD;  Location: [place] [facility];  Service: Cardiovascular;  Laterality: N/A;  . CARDIOVERSION N/A 10/31/2016   Procedure: CARDIOVERSION;  Surgeon: [name], MD;  Location: [place];  Service: Cardiovascular;  Laterality: N/A;  .  CARDIOVERSION N/A 11/03/2016   Procedure: CARDIOVERSION;  Surgeon: [name], MD;  Location: [place];  Service: Cardiovascular;  Laterality: N/A;  . CARDIOVERSION N/A 11/18/2016   Procedure: CARDIOVERSION;  Surgeon: [name], MD;  Location: [place];  Service:  Cardiovascular;  Laterality: N/A;  . CARDIOVERSION N/A 03/25/2017   Procedure: CARDIOVERSION;  Surgeon: [name], MD;  Location[facility]COPY;  Service: Cardiovascular;  Laterality: N/A;  . CARDIOVERSION N/A 05/06/2017   Procedure: CARDIOVERSION;  Surgeon: [name], MD;  Location: [facility] ENDOSCOPY;  Service: Cardiovascular;  Laterality: N/A;  . CARDIOVERSION N/A 05/12/2017   Procedure: CARDIOVERSION;  Surgeon: [name], MD;  Location: [facility] ENDOSCOPY;  Service: Cardiovascular;  Laterality: N/A;  . COLONOSCOPY N/A 12/04/2017   Procedure: COLONOSCOPY;  Surgeon: [name], MD;  Location[facility] ENDOSCOPY;  Service: Endoscopy;  Laterality: N/A;  . COLONOSCOPY    . COLONOSCOPY W/ POLYPECTOMY  02/2011   pan diverticulosis.  tubular adenoma without dysplasia on 5 mm sigmoid polyp.  Dr Fuller Plan.    Marland Kitchen FLEXIBLE SIGMOIDOSCOPY N/A 01/18/2020   Procedure: FLEXIBLE SIGMOIDOSCOPY;  Surgeon: [name], DO;  Location: [place];  Service: Gastroenterology;  Laterality: N/A;  . HEMOSTASIS CLIP PLACEMENT  01/18/2020   Procedure: HEMOSTASIS CLIP PLACEMENT;  Surgeon: [name], DO;  Location: [facility] ENDOSCOPY;  Service: Gastroenterology;;  . PARTIAL HYSTERECTOMY     1 OVARY LEFT  . SCLEROTHERAPY  01/18/2020   Procedure: SCLEROTHERAPY;  Surgeon: [name], DO;  Location: [place] ENDOSCOPY;  Service: Gastroenterology;;  . TEE WITHOUT CARDIOVERSION N/A 10/31/2016   Procedure: TRANSESOPHAGEAL ECHOCARDIOGRAM (TEE);  Surgeon: [name], MD;  Location: [facility] ENDOSCOPY;  Service: Cardiovascular;  Laterality: N/A;  . TEE WITHOUT CARDIOVERSION N/A 03/25/2017   Procedure: TRANSESOPHAGEAL ECHOCARDIOGRAM (TEE);  Surgeon: [name], MD;  Location: [facility] ENDOSCOPY;  Service: Cardiovascular;  Laterality: N/A;   Allergies  Allergen Reactions  . Ace Inhibitors Palpitations   Prior to Admission medications   Medication Sig Start Date End Date Taking? Authorizing Provider  acetaminophen  (TYLENOL) 325 MG tablet Take 2 tablets (650 mg total) by mouth every 4 (four) hours as needed for headache or mild pain. 10/14/19   Shirley Friar, PA-C  apixaban (ELIQUIS) 5 MG TABS tablet Take 1 tablet (5 mg total) by mouth 2 (two) times daily. 03/05/20   Camnitz, Ocie Doyne, MD  Cholecalciferol (VITAMIN D3) 25 MCG (1000 UT) CAPS Take 1,000 Units by mouth daily.    [provider]  furosemide (LASIX) 80 MG tablet TAKE 1 TABLET BY MOUTH EVERY DAY 11/09/19   Bensimhon, Shaune Pascal, MD  isosorbide mononitrate (IMDUR) 30 MG 24 hr tablet TAKE HALF A TABLET BY MOUTH EVERY DAY 09/07/19   Biagio Borg, MD  pantoprazole (PROTONIX) 40 MG tablet Take 1 tablet (40 mg total) by mouth daily. 01/20/20 02/19/20  Madalyn Rob, MD  potassium chloride SA (K-DUR,KLOR-CON) 20 MEQ tablet Take 2 tablets (40 mEq total) by mouth daily. 03/08/19   Larey Dresser, MD  ranolazine (RANEXA) 500 MG 12 hr tablet TAKE 1 TABLET BY MOUTH TWICE A DAY 12/21/19   Larey Dresser, MD  rosuvastatin (CRESTOR) 10 MG tablet Take 1 tablet (10 mg total) by mouth daily. 09/07/19   Biagio Borg, MD  VOLTAREN 1 % GEL Apply 1 application topically 2 (two) times daily as needed (Knee pain).  09/14/19   [provider]   Social History   Socioeconomic History  .  Marital status: Single    Spouse name: Not on file  . Number of children: 1  . Years of education: Not on file  . Highest education level: Not on file  Occupational History  . Occupation: Academic librarian   Tobacco Use  . Smoking status: Former Smoker    Packs/day: 0.50    Types: Cigarettes    Start date: 2019  . Smokeless tobacco: Never Used  Substance and Sexual Activity  . Alcohol use: Not Currently    Alcohol/week: 4.0 standard drinks    Types: 4 Cans of beer per week    Comment: Intermittent  . Drug use: No  . Sexual activity: Not on file  Other Topics Concern  . Not on file  Social History Narrative  . Not on file   Social Determinants of Health    Financial Resource Strain:   . Difficulty of Paying Living Expenses:   Food Insecurity:   . Worried About Charity fundraiser in the Last Year:   . Arboriculturist in the Last Year:   Transportation Needs:   . Film/video editor (Medical):   Marland Kitchen Lack of Transportation (Non-Medical):   Physical Activity:   . Days of Exercise per Week:   . Minutes of Exercise per Session:   Stress:   . Feeling of Stress :   Social Connections:   . Frequency of Communication with Friends and Family:   . Frequency of Social Gatherings with Friends and Family:   . Attends Religious Services:   . Active Member of Clubs or Organizations:   . Attends Archivist Meetings:   Marland Kitchen Marital Status:    Family History  Problem Relation Age of Onset  . Asthma Mother   . Diabetes Father   . Heart disease Father        Died of presumed heart attack - 86s  . Lung disease Sister   . Heart disease Sister        Twin sister has heart issue, unclear what kind  . Thyroid disease Neg Hx   . Colon polyps Neg Hx   . Esophageal cancer Neg Hx   . Pancreatic cancer Neg Hx   . Stomach cancer Neg Hx     ROS: Currently denies lightheadedness, dizziness, Fever, chills, CP, SOB.  No personal history of DVT, PE, MI, or CVA. No loose teeth or dentures All other systems have been reviewed and were otherwise currently negative with the exception of those mentioned in the HPI and as above.  Objective: Vitals: Ht: 5 feet 3 inches wt: 202 lbs Temp: 98.0 BP: 145/83 pulse: 70 O2 95% on room air.   Physical Exam: General: Alert, NAD.  Antalgic Gait.  Uses a wheelchair during this office visit. HEENT: EOMI, Good Neck Extension  Pulm: No increased work of breathing.  Clear B/L A/P w/o crackle or wheeze.  CV: RRR, No m/g/r appreciated  GI: soft, NT, ND Neuro: Neuro without gross focal deficit.  Sensation intact distally Skin: No lesions in the area of chief complaint MSK/Surgical Site: Right knee w/o redness or  effusion.  + JLT. ROM 2.5-90.  5/5 strength in extension and flexion.  +EHL/FHL.  NVI.  Stable varus and valgus stress.    Imaging Review Plain radiographs demonstrate severe degenerative joint disease of the right knee.   Preoperative templating of the joint replacement has been completed, documented, and submitted to the Operating Room personnel in order to optimize intra-operative equipment management.  Assessment:  OA RIGHT KNEE Principal Problem:   Primary osteoarthritis of right knee Active Problems:   HLD (hyperlipidemia)   Essential hypertension   CHF (congestive heart failure) (HCC)   Chronic combined systolic (congestive) and diastolic (congestive) heart failure (HCC)   Chronic atrial fibrillation   Chronic anticoagulation   History of lower GI bleeding   Plan: Plan for Procedure(s): TOTAL KNEE ARTHROPLASTY  The patient history, physical exam, clinical judgement of the provider and imaging are consistent with end stage degenerative joint disease and total joint arthroplasty is deemed medically necessary. The treatment options including medical management, injection therapy, and arthroplasty were discussed at length. The risks and benefits of Procedure(s): TOTAL KNEE ARTHROPLASTY were presented and reviewed.  The risks of nonoperative treatment, versus surgical intervention including but not limited to continued pain, aseptic loosening, stiffness, dislocation/subluxation, infection, bleeding, nerve injury, blood clots, cardiopulmonary complications, morbidity, mortality, among others were discussed. The patient verbalizes understanding and wishes to proceed with the plan.  Patient is being admitted for inpatient treatment for surgery, pain control, PT, prophylactic antibiotics, VTE prophylaxis, progressive ambulation, ADL's and discharge planning.   Dental prophylaxis discussed and recommended for 2 years postoperatively.   The patient does meet the criteria for TXA which  will be used perioperatively.    Eliquis will be resumed postoperatively for Afib / DVT prophylaxis in addition to SCDs, and early ambulation.  Plan for Tylenol, 100 mg Gabapentin, oxycodone for pain.  Baclofen for spasm.   The patient is planning to be discharged home with HHPT.   Patient's anticipated LOS is less than 2 midnights, meeting these requirements: - Lives within 1 hour of care - Has a competent adult at home to recover with post-op recover - NO history of  - Chronic pain requiring opiods  - Diabetes  - Heart failure  - Heart attack  - Stroke  - DVT/VTE  - Cardiac arrhythmia  - Respiratory Failure/COPD  - Renal failure  - Advanced Liver disease   Prudencio Burly III, PA-C 03/20/2020 3:53 PM

## 2020-03-21 ENCOUNTER — Other Ambulatory Visit (HOSPITAL_COMMUNITY): Payer: Self-pay | Admitting: Cardiology

## 2020-03-22 ENCOUNTER — Telehealth: Payer: Self-pay | Admitting: Cardiology

## 2020-03-22 ENCOUNTER — Other Ambulatory Visit (HOSPITAL_COMMUNITY): Payer: Self-pay | Admitting: Cardiology

## 2020-03-22 ENCOUNTER — Other Ambulatory Visit (HOSPITAL_COMMUNITY): Payer: Self-pay | Admitting: Internal Medicine

## 2020-03-22 NOTE — Telephone Encounter (Signed)
lmtcb

## 2020-03-22 NOTE — Telephone Encounter (Signed)
Pt c/o medication issue:  1. Name of Medication: apixaban (ELIQUIS) 5 MG TABS tablet  2. How are you currently taking this medication (dosage and times per day)? As directed  3. Are you having a reaction (difficulty breathing--STAT)? no  4. What is your medication issue? Patient states that she needs this medication approved by Dr. Curt Bears before anymore refills. She only has 2 pills left. She is also requesting to have someone go over her medication list with her and make sure it is updated.

## 2020-03-23 MED ORDER — APIXABAN 5 MG PO TABS: 5.0000 mg | ORAL_TABLET | Freq: Two times a day (BID) | ORAL | 3 refills | Status: DC

## 2020-03-23 NOTE — Telephone Encounter (Signed)
Pt returned my call. She was only wanting to ensure refill sent in on Eliquis. Pt aware I will take care of refill for her now. She also mentions checking if her other cardiology medications need refills and to send.  Checked other needs, most Rx are current w/ refills, one is pending for refill approval through heart failure clinic.

## 2020-03-26 DIAGNOSIS — I509 Heart failure, unspecified: Secondary | ICD-10-CM | POA: Diagnosis not present

## 2020-03-26 DIAGNOSIS — N2581 Secondary hyperparathyroidism of renal origin: Secondary | ICD-10-CM | POA: Diagnosis not present

## 2020-03-26 DIAGNOSIS — N183 Chronic kidney disease, stage 3 unspecified: Secondary | ICD-10-CM | POA: Diagnosis not present

## 2020-03-26 DIAGNOSIS — Z720 Tobacco use: Secondary | ICD-10-CM | POA: Diagnosis not present

## 2020-03-26 DIAGNOSIS — I129 Hypertensive chronic kidney disease with stage 1 through stage 4 chronic kidney disease, or unspecified chronic kidney disease: Secondary | ICD-10-CM | POA: Diagnosis not present

## 2020-03-26 DIAGNOSIS — I428 Other cardiomyopathies: Secondary | ICD-10-CM | POA: Diagnosis not present

## 2020-03-26 DIAGNOSIS — I4891 Unspecified atrial fibrillation: Secondary | ICD-10-CM | POA: Diagnosis not present

## 2020-03-26 DIAGNOSIS — D631 Anemia in chronic kidney disease: Secondary | ICD-10-CM | POA: Diagnosis not present

## 2020-03-29 MED ORDER — ORAL CARE MOUTH RINSE: 15.0000 mL | Freq: Once | OROMUCOSAL | Status: DC

## 2020-03-29 MED ORDER — CHLORHEXIDINE GLUCONATE 0.12 % MT SOLN
15.0000 mL | Freq: Once | OROMUCOSAL | Status: DC
Start: 2020-03-29 — End: 2020-03-29

## 2020-03-29 NOTE — Patient Instructions (Addendum)
DUE TO COVID-19 ONLY ONE VISITOR IS ALLOWED TO COME WITH YOU AND STAY IN THE WAITING ROOM ONLY DURING PRE OP AND PROCEDURE DAY OF SURGERY. THE 2 VISITORS MAY VISIT WITH YOU AFTER SURGERY IN YOUR PRIVATE ROOM DURING VISITING HOURS ONLY!  YOU NEED TO HAVE A COVID 19 TEST ON_5/14______ @__9 :20_____, THIS TEST MUST BE DONE BEFORE SURGERY, COME  [address] [place] , [id number].  ([place]) ONCE YOUR COVID TEST IS COMPLETED, PLEASE BEGIN THE QUARANTINE INSTRUCTIONS AS OUTLINED IN YOUR HANDOUT.                Patricia Wagner   Your procedure is scheduled on: 04/10/20   Report to [facility] Main  Entrance   Report to admitting at   7:30 AM     Call this number if you have problems the morning of surgery [place], NO CHEWING GUM Patricia Wagner.   Do not eat food After Midnight.   YOU MAY HAVE CLEAR LIQUIDS FROM MIDNIGHT UNTIL 7:00 AM.   At 7:00 AM Please finish the prescribed Pre-Surgery  drink.   Nothing by mouth after you finish the drink !   Take these medicines the morning of surgery with A SIP OF WATER:  Ranolazine, isosrbide mononitrate                                 You may not have any metal on your body including hair pins and              piercings  Do not wear jewelry, make-up, lotions, powders or perfumes, deodorant             Do not wear nail polish on your fingernails.  Do not shave  48 hours prior to surgery.              Men may shave face and neck.   Do not bring valuables to the hospital. [place].  Contacts, dentures or bridgework may not be worn into surgery.        Name and phone number of your driver:  Special Instructions: N/A              Please read over the following fact sheets you were given: _____________________________________________________________________             [facility] - Preparing  for Surgery Before surgery, you can play an important role.   Because skin is not sterile, your skin needs to be as free of germs as possible.   You can reduce the number of germs on your skin by washing with CHG (chlorahexidine gluconate) soap before surgery.   CHG is an antiseptic cleaner which kills germs and bonds with the skin to continue killing germs even after washing. Please DO NOT use if you have an allergy to CHG or antibacterial soaps.   If your skin becomes reddened/irritated stop using the CHG and inform your nurse when you arrive at Short Stay. Do not shave (including legs and underarms) for at least 48 hours prior to the first CHG shower.    Please follow these instructions carefully:  1.  Shower with CHG Soap the night before surgery and the  morning of Surgery.  2.  If you choose to wash your hair, wash your hair first as usual with your  normal  shampoo.  3.  After you shampoo, rinse your hair and body thoroughly to remove the  shampoo.                                        4.  Use CHG as you would any other liquid soap.  You can apply chg directly  to the skin and wash                       Gently with a scrungie or clean washcloth.  5.  Apply the CHG Soap to your body ONLY FROM THE NECK DOWN.   Do not use on face/ open                           Wound or open sores. Avoid contact with eyes, ears mouth and genitals (private parts).                       Wash face,  Genitals (private parts) with your normal soap.             6.  Wash thoroughly, paying special attention to the area where your surgery  will be performed.  7.  Thoroughly rinse your body with warm water from the neck down.  8.  DO NOT shower/wash with your normal soap after using and rinsing off  the CHG Soap.             9.  Pat yourself dry with a clean towel.            10.  Wear clean pajamas.            11.  Place clean sheets on your bed the night of your first shower and do not  sleep with pets. Day of  Surgery : Do not apply any lotions/deodorants the morning of surgery.  Please wear clean clothes to the hospital/surgery center.  FAILURE TO FOLLOW THESE INSTRUCTIONS MAY RESULT IN THE CANCELLATION OF YOUR SURGERY PATIENT SIGNATURE_________________________________  NURSE SIGNATURE__________________________________  ________________________________________________________________________   Adam Phenix  An incentive spirometer is a tool that can help keep your lungs clear and active. This tool measures how well you are filling your lungs with each breath. Taking long deep breaths may help reverse or decrease the chance of developing breathing (pulmonary) problems (especially infection) following:  A long period of time when you are unable to move or be active. BEFORE THE PROCEDURE   If the spirometer includes an indicator to show your best effort, your nurse or respiratory therapist will set it to a desired goal.  If possible, sit up straight or lean slightly forward. Try not to slouch.  Hold the incentive spirometer in an upright position. INSTRUCTIONS FOR USE  1. Sit on the edge of your bed if possible, or sit up as far as you can in bed or on a chair. 2. Hold the incentive spirometer in an upright position. 3. Breathe out normally. 4. Place the mouthpiece in your mouth and seal your lips tightly around it. 5. Breathe in slowly and as deeply as possible, raising the piston or the ball toward the top of the column. 6. Hold your  breath for 3-5 seconds or for as long as possible. Allow the piston or ball to fall to the bottom of the column. 7. Remove the mouthpiece from your mouth and breathe out normally. 8. Rest for a few seconds and repeat Steps 1 through 7 at least 10 times every 1-2 hours when you are awake. Take your time and take a few normal breaths between deep breaths. 9. The spirometer may include an indicator to show your best effort. Use the indicator as a goal to  work toward during each repetition. 10. After each set of 10 deep breaths, practice coughing to be sure your lungs are clear. If you have an incision (the cut made at the time of surgery), support your incision when coughing by placing a pillow or rolled up towels firmly against it. Once you are able to get out of bed, walk around indoors and cough well. You may stop using the incentive spirometer when instructed by your caregiver.  RISKS AND COMPLICATIONS  Take your time so you do not get dizzy or light-headed.  If you are in pain, you may need to take or ask for pain medication before doing incentive spirometry. It is harder to take a deep breath if you are having pain. AFTER USE  Rest and breathe slowly and easily.  It can be helpful to keep track of a log of your progress. Your caregiver can provide you with a simple table to help with this. If you are using the spirometer at home, follow these instructions: [place] IF:   You are having difficultly using the spirometer.  You have trouble using the spirometer as often as instructed.  Your pain medication is not giving enough relief while using the spirometer.  You develop fever of 100.5 F (38.1 C) or higher. SEEK IMMEDIATE MEDICAL CARE IF:   You cough up bloody sputum that had not been present before.  You develop fever of 102 F (38.9 C) or greater.  You develop worsening pain at or near the incision site. MAKE SURE YOU:   Understand these instructions.  Will watch your condition.  Will get help right away if you are not doing well or get worse. Document Released: 03/23/2007 Document Revised: 02/02/2012 Document Reviewed: 05/24/2007 [facility] Patient Information 2014 [place], [place].   ________________________________________________________________________

## 2020-03-30 ENCOUNTER — Other Ambulatory Visit: Payer: Self-pay

## 2020-03-30 ENCOUNTER — Encounter (HOSPITAL_COMMUNITY)
Admission: RE | Admit: 2020-03-30 | Discharge: 2020-03-30 | Disposition: A | Discharge status: Home / Self Care | Payer: Medicare HMO | Source: Ambulatory Visit | Attending: Orthopedic Surgery | Admitting: Orthopedic Surgery

## 2020-03-30 ENCOUNTER — Encounter (HOSPITAL_COMMUNITY): Payer: Self-pay

## 2020-03-30 DIAGNOSIS — I130 Hypertensive heart and chronic kidney disease with heart failure and stage 1 through stage 4 chronic kidney disease, or unspecified chronic kidney disease: Secondary | ICD-10-CM | POA: Insufficient documentation

## 2020-03-30 DIAGNOSIS — Z01818 Encounter for other preprocedural examination: Secondary | ICD-10-CM | POA: Insufficient documentation

## 2020-03-30 DIAGNOSIS — N183 Chronic kidney disease, stage 3 unspecified: Secondary | ICD-10-CM | POA: Insufficient documentation

## 2020-03-30 DIAGNOSIS — I509 Heart failure, unspecified: Secondary | ICD-10-CM | POA: Diagnosis not present

## 2020-03-30 DIAGNOSIS — E559 Vitamin D deficiency, unspecified: Secondary | ICD-10-CM | POA: Diagnosis not present

## 2020-03-30 DIAGNOSIS — M1711 Unilateral primary osteoarthritis, right knee: Secondary | ICD-10-CM | POA: Insufficient documentation

## 2020-03-30 DIAGNOSIS — Z8673 Personal history of transient ischemic attack (TIA), and cerebral infarction without residual deficits: Secondary | ICD-10-CM | POA: Insufficient documentation

## 2020-03-30 DIAGNOSIS — I2510 Atherosclerotic heart disease of native coronary artery without angina pectoris: Secondary | ICD-10-CM | POA: Diagnosis not present

## 2020-03-30 DIAGNOSIS — Z87891 Personal history of nicotine dependence: Secondary | ICD-10-CM | POA: Diagnosis not present

## 2020-03-30 DIAGNOSIS — Z7901 Long term (current) use of anticoagulants: Secondary | ICD-10-CM | POA: Insufficient documentation

## 2020-03-30 DIAGNOSIS — I480 Paroxysmal atrial fibrillation: Secondary | ICD-10-CM | POA: Insufficient documentation

## 2020-03-30 DIAGNOSIS — E785 Hyperlipidemia, unspecified: Secondary | ICD-10-CM | POA: Insufficient documentation

## 2020-03-30 HISTORY — DX: Presence of urogenital implants: Z96.0

## 2020-03-30 HISTORY — DX: Personal history of urinary calculi: Z87.442

## 2020-03-30 LAB — URINALYSIS, ROUTINE W REFLEX MICROSCOPIC
Bilirubin Urine: NEGATIVE
Glucose, UA: NEGATIVE mg/dL
Ketones, ur: NEGATIVE mg/dL
Nitrite: NEGATIVE
Protein, ur: NEGATIVE mg/dL
Specific Gravity, Urine: 1.013 (ref 1.005–1.030)
pH: 5 (ref 5.0–8.0)

## 2020-03-30 LAB — CBC
HCT: 40.1 % (ref 36.0–46.0)
Hemoglobin: 12.1 g/dL (ref 12.0–15.0)
MCH: 28.3 pg (ref 26.0–34.0)
MCHC: 30.2 g/dL (ref 30.0–36.0)
MCV: 93.7 fL (ref 80.0–100.0)
Platelets: 251 10*3/uL (ref 150–400)
RBC: 4.28 MIL/uL (ref 3.87–5.11)
RDW: 19.7 % — ABNORMAL HIGH (ref 11.5–15.5)
WBC: 6.8 10*3/uL (ref 4.0–10.5)
nRBC: 0 % (ref 0.0–0.2)

## 2020-03-30 LAB — BASIC METABOLIC PANEL
Anion gap: 10 (ref 5–15)
BUN: 28 mg/dL — ABNORMAL HIGH (ref 8–23)
CO2: 29 mmol/L (ref 22–32)
Calcium: 9.5 mg/dL (ref 8.9–10.3)
Chloride: 100 mmol/L (ref 98–111)
Creatinine, Ser: 1.68 mg/dL — ABNORMAL HIGH (ref 0.44–1.00)
GFR calc Af Amer: 36 mL/min — ABNORMAL LOW (ref >60)
GFR calc non Af Amer: 31 mL/min — ABNORMAL LOW (ref >60)
Glucose, Bld: 93 mg/dL (ref 70–99)
Potassium: 4.2 mmol/L (ref 3.5–5.1)
Sodium: 139 mmol/L (ref 135–145)

## 2020-03-30 LAB — SURGICAL PCR SCREEN
MRSA, PCR: NEGATIVE
Staphylococcus aureus: POSITIVE — AB

## 2020-03-30 NOTE — Progress Notes (Signed)
PCP - Dr. Jamelle Haring Cardiologist - Dr.Bensimhon  Chest x-ray - no EKG - 02/06/20 Stress Test - no ECHO - 07/19/19 Cardiac Cath - 2017  Sleep Study - no CPAP -   Fasting Blood Sugar - no Checks Blood Sugar _____ times a day  Blood Thinner Instructions:Eliquis Aspirin Instructions:Dr. Bensimhon said to stop 2 days prior to DOS Last Dose:04/07/20  Anesthesia review:   Patient denies shortness of breath, fever, cough and chest pain at PAT appointment yes  Patient verbalized understanding of instructions that were given to them at the PAT appointment. Patient was also instructed that they will need to review over the PAT instructions again at home before surgery. Yes Pt has a suprapubic cath that she want exchanged on DOS.

## 2020-04-03 NOTE — Progress Notes (Signed)
Anesthesia Chart Review   Case: [id number] Date/Time: 04/10/20 0948   Procedure: TOTAL KNEE ARTHROPLASTY (Right Knee)   Anesthesia type: Choice   Pre-op diagnosis: OA RIGHT KNEE   Location: [facility] ROOM 08 / [facility]   Surgeons: Renette Butters, MD      DISCUSSION:68 y.o. former smoker with h/o HTN, HLD, CHF, CAD, atrial fibrillation (on Eliquis), Stroke, CKD Stage III, PAF, CVA, OA right knee scheduled for above procedure 04/10/2020 with Dr. Edmonia Lynch.   Pt last seen by PCP 03/05/2020 for preoperative evaluation.  Per OV note, "Pt appears stable at this time.  Will need cards clearance for surgery as well."  Clearance received from cardiology 01/31/2020.  Per telephone encounter, "Chart reviewed as part of pre-operative protocol coverage. Pre-op clearance already addressed by colleagues in earlier phone notes. To summarize recommendations: -Patient was contacted3/5/2021in reference to pre-operative risk assessment for pending surgery as outlined below. Patricia Wagner last seen on 2/23/21by Dr. Curt Bears. Since that day, Patricia Wagner done fine from a cardiac standpoint.She has no complaints of chest pain, SOB, or palpitations, however she is quite limited in mobility by knee pain/stiffness and is unable to complete 4 METs. -Per Dr. Curt Bears, the patient is Intermediate risk for this intermediate risk procedure. OK to hold anticoagulation per protocol for spinal anesthesia. Patient can hold eliquis 3 days prior to her upcoming knee replacement and should restart when cleared to do so by Dr. Percell Miller."  Pt reports last dose 04/07/2020.   Anticipate pt can proceed with planned procedure barring acute status change.   VS: BP (!) 131/91   Pulse 80   Temp 36.8 C (Oral)   Resp 18   Ht 5\' 5"  (1.651 m)   Wt 91.6 kg Comment: patient could not stand to weigh  SpO2 94%   BMI 33.61 kg/m   PROVIDERS: Maximiano Coss, NP is PCP   Glori Bickers, MD is Cardiologist  LABS: Labs reviewed:  Acceptable for surgery. (all labs ordered are listed, but only abnormal results are displayed)  Labs Reviewed  SURGICAL PCR SCREEN - Abnormal; Notable for the following components:      Result Value   Staphylococcus aureus POSITIVE (*)    All other components within normal limits  BASIC METABOLIC PANEL - Abnormal; Notable for the following components:   BUN 28 (*)    Creatinine, Ser 1.68 (*)    GFR calc non Af Amer 31 (*)    GFR calc Af Amer 36 (*)    All other components within normal limits  CBC - Abnormal; Notable for the following components:   RDW 19.7 (*)    All other components within normal limits  URINALYSIS, ROUTINE W REFLEX MICROSCOPIC - Abnormal; Notable for the following components:   APPearance HAZY (*)    Hgb urine dipstick SMALL (*)    Leukocytes,Ua TRACE (*)    Bacteria, UA MANY (*)    All other components within normal limits     IMAGES:   EKG: 02/06/2020 Rate 67 bpm  Sinus rhythm  Intraventricular conduciton defect- consider left ventricular hypertrophy  Consider old anterior infarct Nonspecific T-abnormality   CV: Echo 07/19/2019 IMPRESSIONS    1. The left ventricle has mildly reduced systolic function, with an  ejection fraction of 45-50%. The cavity size was normal. There is severe  concentric left ventricular hypertrophy. Left ventricular diastolic  Doppler parameters are consistent with  impaired relaxation.  2. The right ventricle has normal systolic function. The cavity was  normal. There is no increase in right ventricular wall thickness.  3. Left atrial size was mildly dilated.  4. The aortic valve is tricuspid. Mild calcification of the aortic valve.  No aortic stenosis  Cardiac Cath 10/23/2016 Conclusions: 1. Mild, non-obstructive coronary artery disease consistent with non-ischemic cardiomyopathy. 2. Mildly elevated left ventricular filling pressure.  Recommendations: 1. Continue medical treatment of non-ischemic  cardiomyopathy and atrial fibrillation with rapid ventricular response. 2. Primary prevention to prevent progression of mild CAD. 3. Restart apixaban at 2200 tonight if right radial arteriotomy site is benign with evidence of bleeding. Past Medical History:  Diagnosis Date  . Acute blood loss anemia 03/05/2018  . Acute hypercapnic respiratory failure ([place]) 04/30/2017  . Acute renal failure superimposed on chronic kidney disease ([place]) 10/20/2016  . Arthritis    hands, knees  . Atrial fibrillation ([place])    a. s/p multiple cardioversions; failed tikosyn/sotalol.  . Bradycardia 11/20/2016  . CAD in native artery, s/p cardiac cath with non obstructive CAD 10/24/2016  . Cerebrovascular accident (CVA) due to embolism of left cerebellar artery ([place])   . CHF (congestive heart failure) ([place])   . Chronic anticoagulation 03/15/2018  . Chronic atrial fibrillation ([place]) 03/05/2018  . Chronic systolic (congestive) heart failure ([place]) 03/05/2018  . CKD (chronic kidney disease) stage 3, GFR 30-59 ml/min   . Coagulopathy ([place]) 03/05/2018  . DIVERTICULITIS, HX OF 07/25/2007  . Diverticulosis of colon with hemorrhage 07/22/2007   Qualifier: Diagnosis of  By: [name]    . DIVERTICULOSIS, COLON 07/22/2007  . Dysuria 07/21/2017  . Edema, peripheral    a. chronic BLE edema, R>L. Prior trauma from dog attack and accident.  . Essential hypertension 07/22/2007   Qualifier: Diagnosis of  By: [name]    . History of kidney stones   . HLD (hyperlipidemia) 02/03/2008   Qualifier: Diagnosis of  By: [name], [name]   . HYPERLIPIDEMIA 02/03/2008  . Hypersomnia    declines w/u  . Hypertension   . Hypotension (arterial) 04/30/2017  . MENOPAUSAL DISORDER 01/09/2011  . Morbid obesity ([place]) 07/22/2007  . NICM (nonischemic cardiomyopathy) ([place]) 10/24/2016  . PAF (paroxysmal atrial fibrillation) ([place])   . Presence of indwelling urinary catheter    supra pubic cath  . Raynaud's syndrome 07/22/2007  . Stroke ([place])  2017  . Suspected sleep apnea 05/20/2017  . THYROID NODULE, RIGHT 01/04/2010  . VITAMIN D DEFICIENCY 01/09/2011   Qualifier: Diagnosis of  By: [name], [name]     Past Surgical History:  Procedure Laterality Date  . ATRIAL FIBRILLATION ABLATION N/A 10/13/2019   Procedure: ATRIAL FIBRILLATION ABLATION;  Surgeon: [name], MD;  Location: [place] CV LAB;  Service: Cardiovascular;  Laterality: N/A;  . CARDIAC CATHETERIZATION N/A 10/23/2016   Procedure: Left Heart Cath and Coronary Angiography;  Surgeon: [name], MD;  Location: [place] [facility];  Service: Cardiovascular;  Laterality: N/A;  . CARDIOVERSION N/A 10/31/2016   Procedure: CARDIOVERSION;  Surgeon: [name], MD;  Location: [facility] ENDOSCOPY;  Service: Cardiovascular;  Laterality: N/A;  . CARDIOVERSION N/A 11/03/2016   Procedure: CARDIOVERSION;  Surgeon: [name], MD;  Location: [facility] ENDOSCOPY;  Service: Cardiovascular;  Laterality: N/A;  . CARDIOVERSION N/A 11/18/2016   Procedure: CARDIOVERSION;  Surgeon: [name], MD;  Location: [facility] ENDOSCOPY;  Service: Cardiovascular;  Laterality: N/A;  . CARDIOVERSION N/A 03/25/2017   Procedure: CARDIOVERSION;  Surgeon: [name], MD;  Location: [place];  Service: Cardiovascular;  Laterality: N/A;  .  CARDIOVERSION N/A 05/06/2017   Procedure: CARDIOVERSION;  Surgeon: [name], MD;  Location: [facility] ENDOSCOPY;  Service: Cardiovascular;  Laterality: N/A;  . CARDIOVERSION N/A 05/12/2017   Procedure: CARDIOVERSION;  Surgeon: [name], MD;  Location: [facility] ENDOSCOPY;  Service: Cardiovascular;  Laterality: N/A;  . COLONOSCOPY N/A 12/04/2017   Procedure: COLONOSCOPY;  Surgeon: [name], MD;  Location: [facility] ENDOSCOPY;  Service: Endoscopy;  Laterality: N/A;  . COLONOSCOPY    . COLONOSCOPY W/ POLYPECTOMY  02/2011   pan diverticulosis.  tubular adenoma without dysplasia on 5 mm sigmoid polyp.  Dr Fuller Plan.    Marland Kitchen FLEXIBLE SIGMOIDOSCOPY N/A 01/18/2020    Procedure: FLEXIBLE SIGMOIDOSCOPY;  Surgeon: [name], DO;  Location: [place];  Service: Gastroenterology;  Laterality: N/A;  . HEMOSTASIS CLIP PLACEMENT  01/18/2020   Procedure: HEMOSTASIS CLIP PLACEMENT;  Surgeon: [name], DO;  Location: [facility] ENDOSCOPY;  Service: Gastroenterology;;  . PARTIAL HYSTERECTOMY     1 OVARY LEFT  . SCLEROTHERAPY  01/18/2020   Procedure: SCLEROTHERAPY;  Surgeon: [name], DO;  Location: [place] ENDOSCOPY;  Service: Gastroenterology;;  . TEE WITHOUT CARDIOVERSION N/A 10/31/2016   Procedure: TRANSESOPHAGEAL ECHOCARDIOGRAM (TEE);  Surgeon: [name], MD;  Location: [facility] ENDOSCOPY;  Service: Cardiovascular;  Laterality: N/A;  . TEE WITHOUT CARDIOVERSION N/A 03/25/2017   Procedure: TRANSESOPHAGEAL ECHOCARDIOGRAM (TEE);  Surgeon: [name], MD;  Location: [facility] ENDOSCOPY;  Service: Cardiovascular;  Laterality: N/A;    MEDICATIONS: . acetaminophen (TYLENOL) 325 MG tablet  . apixaban (ELIQUIS) 5 MG TABS tablet  . Cholecalciferol (VITAMIN D3) 25 MCG (1000 UT) CAPS  . furosemide (LASIX) 80 MG tablet  . isosorbide mononitrate (IMDUR) 30 MG 24 hr tablet  . KLOR-CON M20 20 MEQ tablet  . pantoprazole (PROTONIX) 40 MG tablet  . ranolazine (RANEXA) 500 MG 12 hr tablet  . rosuvastatin (CRESTOR) 10 MG tablet  . VOLTAREN 1 % GEL   No current facility-administered medications for this encounter.    Maia Plan [facility] Pre-Surgical Testing [phone] 04/04/20  1:02 PM

## 2020-04-04 ENCOUNTER — Encounter: Payer: Self-pay | Admitting: Registered Nurse

## 2020-04-04 NOTE — Anesthesia Preprocedure Evaluation (Addendum)
Anesthesia Evaluation  Patient identified by MRN, date of birth, ID band Patient awake    Reviewed: Allergy & Precautions, NPO status , Patient's Chart, lab work & pertinent test results  Airway Mallampati: II  TM Distance: >3 FB Neck ROM: Full    Dental  (+) Missing, Dental Advisory Given,    Pulmonary neg pulmonary ROS, former smoker,    Pulmonary exam normal breath sounds clear to auscultation       Cardiovascular hypertension, Pt. on medications + CAD and +CHF  Normal cardiovascular exam+ dysrhythmias (on eliquis) Atrial Fibrillation  Rhythm:Regular Rate:Normal  HLD  TTE 2020 1. The left ventricle has mildly reduced systolic function, with an ejection fraction of 45-50%. The cavity size was normal. There is severe concentric left ventricular hypertrophy. Left ventricular diastolic Doppler parameters are consistent with impaired relaxation.  2. The right ventricle has normal systolic function. The cavity was normal. There is no increase in right ventricular wall thickness.  3. Left atrial size was mildly dilated.  4. The aortic valve is tricuspid. Mild calcification of the aortic valve. No aortic stenosis.  [facility] 2017 Conclusions: 1.Mild, non-obstructive coronary artery disease consistent with non-ischemic cardiomyopathy. 2.Mildly elevated left ventricular filling pressure. Recommendations: 1.Continue medical treatment of non-ischemic cardiomyopathy and atrial fibrillation with rapid ventricular response. 2.Primary prevention to prevent progression of mild CAD. 3.Restart apixaban at 2200 tonight if right radial arteriotomy site is benign with evidence of bleeding.   Neuro/Psych CVA negative psych ROS   GI/Hepatic negative GI ROS, Neg liver ROS,   Endo/Other  negative endocrine ROS  Renal/GU Renal InsufficiencyRenal disease  negative genitourinary   Musculoskeletal  (+) Arthritis ,   Abdominal   Peds   Hematology negative hematology ROS (+)   Anesthesia Other Findings Last dose eliquis Saturday (5/15) evening 8:30pm  Reproductive/Obstetrics                         Anesthesia Physical Anesthesia Plan  ASA: III  Anesthesia Plan: General and Regional   Post-op Pain Management:  Regional for Post-op pain   Induction:   PONV Risk Score and Plan: 3 and Treatment may vary due to age or medical condition, Midazolam, Ondansetron and Dexamethasone  Airway Management Planned: LMA  Additional Equipment:   Intra-op Plan:   Post-operative Plan: Extubation in OR  Informed Consent: I have reviewed the patients History and Physical, chart, labs and discussed the procedure including the risks, benefits and alternatives for the proposed anesthesia with the patient or authorized representative who has indicated his/her understanding and acceptance.     Dental advisory given  Plan Discussed with: CRNA  Anesthesia Plan Comments:       Anesthesia Quick Evaluation

## 2020-04-04 NOTE — Progress Notes (Signed)
Established Patient Office Visit  Subjective:  Patient ID: Patricia Wagner, female    DOB: 18-Jul-1951  Age: 69 y.o. MRN: [id number]  CC:  Chief Complaint  Patient presents with  . Follow-up    6 week follow up. no other questions or concerns    HPI Patricia Wagner presents for 6 week follow up and pre op Having arthroscopic repair of knee No acute complaints or concerns  Has been feeling well Looking forward to repair of knee as Patricia Wagner hopes to be more active  Past Medical History:  Diagnosis Date  . Acute blood loss anemia 03/05/2018  . Acute hypercapnic respiratory failure ([place]) 04/30/2017  . Acute renal failure superimposed on chronic kidney disease ([place]) 10/20/2016  . Arthritis    hands, knees  . Atrial fibrillation ([place])    a. s/p multiple cardioversions; failed tikosyn/sotalol.  . Bradycardia 11/20/2016  . CAD in native artery, s/p cardiac cath with non obstructive CAD 10/24/2016  . Cerebrovascular accident (CVA) due to embolism of left cerebellar artery ([place])   . CHF (congestive heart failure) ([place])   . Chronic anticoagulation 03/15/2018  . Chronic atrial fibrillation ([place]) 03/05/2018  . Chronic systolic (congestive) heart failure ([place]) 03/05/2018  . CKD (chronic kidney disease) stage 3, GFR 30-59 ml/min   . Coagulopathy ([place]) 03/05/2018  . DIVERTICULITIS, HX OF 07/25/2007  . Diverticulosis of colon with hemorrhage 07/22/2007   Qualifier: Diagnosis of  By: [name]    . DIVERTICULOSIS, COLON 07/22/2007  . Dysuria 07/21/2017  . Edema, peripheral    a. chronic BLE edema, R>L. Prior trauma from dog attack and accident.  . Essential hypertension 07/22/2007   Qualifier: Diagnosis of  By: [name]    . History of kidney stones   . HLD (hyperlipidemia) 02/03/2008   Qualifier: Diagnosis of  By: [name], [name]   . HYPERLIPIDEMIA 02/03/2008  . Hypersomnia    declines w/u  . Hypertension   . Hypotension (arterial) 04/30/2017  . MENOPAUSAL DISORDER 01/09/2011  . Morbid  obesity ([place]) 07/22/2007  . NICM (nonischemic cardiomyopathy) ([place]) 10/24/2016  . PAF (paroxysmal atrial fibrillation) ([place])   . Presence of indwelling urinary catheter    supra pubic cath  . Raynaud's syndrome 07/22/2007  . Stroke ([place]) 2017  . Suspected sleep apnea 05/20/2017  . THYROID NODULE, RIGHT 01/04/2010  . VITAMIN D DEFICIENCY 01/09/2011   Qualifier: Diagnosis of  By: [name], [name]     Past Surgical History:  Procedure Laterality Date  . ATRIAL FIBRILLATION ABLATION N/A 10/13/2019   Procedure: ATRIAL FIBRILLATION ABLATION;  Surgeon: [name], MD;  Location: [place] CV LAB;  Service: Cardiovascular;  Laterality: N/A;  . CARDIAC CATHETERIZATION N/A 10/23/2016   Procedure: Left Heart Cath and Coronary Angiography;  Surgeon: [name], MD;  Location: [place] [facility] LAB;  Service: Cardiovascular;  Laterality: N/A;  . CARDIOVERSION N/A 10/31/2016   Procedure: CARDIOVERSION;  Surgeon: [name], MD;  Location: [facility] ENDOSCOPY;  Service: Cardiovascular;  Laterality: N/A;  . CARDIOVERSION N/A 11/03/2016   Procedure: CARDIOVERSION;  Surgeon: [name], MD;  Location: [facility] ENDOSCOPY;  Service: Cardiovascular;  Laterality: N/A;  . CARDIOVERSION N/A 11/18/2016   Procedure: CARDIOVERSION;  Surgeon: [name], MD;  Location: [facility] ENDOSCOPY;  Service: Cardiovascular;  Laterality: N/A;  . CARDIOVERSION N/A 03/25/2017   Procedure: CARDIOVERSION;  Surgeon: [name], MD;  Location: [facility] ENDOSCOPY;  Service: Cardiovascular;  Laterality: N/A;  . CARDIOVERSION N/A 05/06/2017  Procedure: CARDIOVERSION;  Surgeon: [name], MD;  Location: [facility] ENDOSCOPY;  Service: Cardiovascular;  Laterality: N/A;  . CARDIOVERSION N/A 05/12/2017   Procedure: CARDIOVERSION;  Surgeon: [name], MD;  Location: [facility] ENDOSCOPY;  Service: Cardiovascular;  Laterality: N/A;  . COLONOSCOPY N/A 12/04/2017   Procedure: COLONOSCOPY;  Surgeon: [name], MD;  Location: [facility]  ENDOSCOPY;  Service: Endoscopy;  Laterality: N/A;  . COLONOSCOPY    . COLONOSCOPY W/ POLYPECTOMY  02/2011   pan diverticulosis.  tubular adenoma without dysplasia on 5 mm sigmoid polyp.  Dr Fuller Plan.    Marland Kitchen FLEXIBLE SIGMOIDOSCOPY N/A 01/18/2020   Procedure: FLEXIBLE SIGMOIDOSCOPY;  Surgeon: [name], DO;  Location: [place];  Service: Gastroenterology;  Laterality: N/A;  . HEMOSTASIS CLIP PLACEMENT  01/18/2020   Procedure: HEMOSTASIS CLIP PLACEMENT;  Surgeon: [name], DO;  Location: [facility] ENDOSCOPY;  Service: Gastroenterology;;  . PARTIAL HYSTERECTOMY     1 OVARY LEFT  . SCLEROTHERAPY  01/18/2020   Procedure: SCLEROTHERAPY;  Surgeon: [name], DO;  Location: [place] ENDOSCOPY;  Service: Gastroenterology;;  . TEE WITHOUT CARDIOVERSION N/A 10/31/2016   Procedure: TRANSESOPHAGEAL ECHOCARDIOGRAM (TEE);  Surgeon: [name], MD;  Location: [facility] ENDOSCOPY;  Service: Cardiovascular;  Laterality: N/A;  . TEE WITHOUT CARDIOVERSION N/A 03/25/2017   Procedure: TRANSESOPHAGEAL ECHOCARDIOGRAM (TEE);  Surgeon: [name], MD;  Location: [facility] ENDOSCOPY;  Service: Cardiovascular;  Laterality: N/A;    Family History  Problem Relation Age of Onset  . Asthma Mother   . Diabetes Father   . Heart disease Father        Died of presumed heart attack - 58s  . Lung disease Sister   . Heart disease Sister        Twin sister has heart issue, unclear what kind  . Thyroid disease Neg Hx   . Colon polyps Neg Hx   . Esophageal cancer Neg Hx   . Pancreatic cancer Neg Hx   . Stomach cancer Neg Hx     Social History   Socioeconomic History  . Marital status: Single    Spouse name: Not on file  . Number of children: 1  . Years of education: Not on file  . Highest education level: Not on file  Occupational History  . Occupation: Academic librarian   Tobacco Use  . Smoking status: Former Smoker    Packs/day: 0.50    Types: Cigarettes    Start date: 2019  . Smokeless tobacco: Never Used   Substance and Sexual Activity  . Alcohol use: Not Currently    Alcohol/week: 4.0 standard drinks    Types: 4 Cans of beer per week    Comment: Intermittent  . Drug use: No  . Sexual activity: Not on file  Other Topics Concern  . Not on file  Social History Narrative  . Not on file   Social Determinants of Health   Financial Resource Strain:   . Difficulty of Paying Living Expenses:   Food Insecurity:   . Worried About Charity fundraiser in the Last Year:   . Arboriculturist in the Last Year:   Transportation Needs:   . Film/video editor (Medical):   Marland Kitchen Lack of Transportation (Non-Medical):   Physical Activity:   . Days of Exercise per Week:   . Minutes of Exercise per Session:   Stress:   . Feeling of Stress :   Social Connections:   . Frequency of Communication with Friends and  Family:   . Frequency of Social Gatherings with Friends and Family:   . Attends Religious Services:   . Active Member of Clubs or Organizations:   . Attends Archivist Meetings:   Marland Kitchen Marital Status:   Intimate Partner Violence:   . Fear of Current or Ex-Partner:   . Emotionally Abused:   Marland Kitchen Physically Abused:   . Sexually Abused:     Outpatient Medications Prior to Visit  Medication Sig Dispense Refill  . acetaminophen (TYLENOL) 325 MG tablet Take 2 tablets (650 mg total) by mouth every 4 (four) hours as needed for headache or mild pain.    . Cholecalciferol (VITAMIN D3) 25 MCG (1000 UT) CAPS Take 1,000 Units by mouth daily.    . furosemide (LASIX) 80 MG tablet TAKE 1 TABLET BY MOUTH EVERY DAY (Patient taking differently: Take 80 mg by mouth daily. ) 90 tablet 3  . isosorbide mononitrate (IMDUR) 30 MG 24 hr tablet TAKE HALF A TABLET BY MOUTH EVERY DAY (Patient taking differently: Take 15 mg by mouth daily. ) 45 tablet 1  . ranolazine (RANEXA) 500 MG 12 hr tablet TAKE 1 TABLET BY MOUTH TWICE A DAY (Patient taking differently: Take 500 mg by mouth 2 (two) times daily. ) 180 tablet 3   . rosuvastatin (CRESTOR) 10 MG tablet Take 1 tablet (10 mg total) by mouth daily. 90 tablet 3  . VOLTAREN 1 % GEL Apply 1 application topically 2 (two) times daily as needed (Knee pain).     Marland Kitchen apixaban (ELIQUIS) 5 MG TABS tablet Take 1 tablet (5 mg total) by mouth 2 (two) times daily. 60 tablet 11  . potassium chloride SA (K-DUR,KLOR-CON) 20 MEQ tablet Take 2 tablets (40 mEq total) by mouth daily. 180 tablet 3  . pantoprazole (PROTONIX) 40 MG tablet Take 1 tablet (40 mg total) by mouth daily. 30 tablet 0   No facility-administered medications prior to visit.    Allergies  Allergen Reactions  . Ace Inhibitors Palpitations    ROS Review of Systems  Constitutional: Negative.   HENT: Negative.   Eyes: Negative.   Respiratory: Negative.   Cardiovascular: Negative.   Gastrointestinal: Negative.   Endocrine: Negative.   Genitourinary: Negative.   Musculoskeletal: Positive for arthralgias.  Skin: Negative.   Allergic/Immunologic: Negative.   Neurological: Negative.   Hematological: Negative.   Psychiatric/Behavioral: Negative.   All other systems reviewed and are negative.     Objective:    Physical Exam  Constitutional: Patricia Wagner is oriented to person, place, and time. Patricia Wagner appears well-developed and well-nourished. No distress.  Cardiovascular: Normal rate, regular rhythm and normal heart sounds. Exam reveals no gallop and no friction rub.  No murmur heard. Pulmonary/Chest: Effort normal and breath sounds normal. No respiratory distress. Patricia Wagner has no wheezes. Patricia Wagner has no rales. Patricia Wagner exhibits no tenderness.  Musculoskeletal:        General: Tenderness present. No deformity or edema. Normal range of motion.  Neurological: Patricia Wagner is alert and oriented to person, place, and time.  Skin: Skin is warm and dry. No rash noted. Patricia Wagner is not diaphoretic. No erythema. No pallor.  Psychiatric: Patricia Wagner has a normal mood and affect. Her behavior is normal. Judgment and thought content normal.  Nursing note  and vitals reviewed.   BP 112/67   Pulse 62   Temp 98 F (36.7 C) (Temporal)   Resp 16   Ht 5\' 5"  (1.651 m)   Wt 200 lb (90.7 kg)   SpO2 99%  BMI 33.28 kg/m  Wt Readings from Last 3 Encounters:  03/30/20 202 lb (91.6 kg)  03/05/20 200 lb (90.7 kg)  02/20/20 200 lb (90.7 kg)     Health Maintenance Due  Topic Date Due  . COVID-19 Vaccine (1) Never done    There are no preventive care reminders to display for this patient.  Lab Results  Component Value Date   TSH 0.291 (L) 03/05/2020   Lab Results  Component Value Date   WBC 6.8 03/30/2020   HGB 12.1 03/30/2020   HCT 40.1 03/30/2020   MCV 93.7 03/30/2020   PLT 251 03/30/2020   Lab Results  Component Value Date   NA 139 03/30/2020   K 4.2 03/30/2020   CO2 29 03/30/2020   GLUCOSE 93 03/30/2020   BUN 28 (H) 03/30/2020   CREATININE 1.68 (H) 03/30/2020   BILITOT 0.5 03/05/2020   ALKPHOS 61 03/05/2020   AST 8 03/05/2020   ALT 4 03/05/2020   PROT 6.6 03/05/2020   ALBUMIN 3.5 (L) 03/05/2020   CALCIUM 9.5 03/30/2020   ANIONGAP 10 03/30/2020   GFR 28.69 (L) 04/15/2019   Lab Results  Component Value Date   CHOL 136 01/24/2020   Lab Results  Component Value Date   HDL 71 01/24/2020   Lab Results  Component Value Date   LDLCALC 51 01/24/2020   Lab Results  Component Value Date   TRIG 71 01/24/2020   Lab Results  Component Value Date   CHOLHDL 1.9 01/24/2020   Lab Results  Component Value Date   HGBA1C 4.7 (L) 03/05/2020      Assessment & Plan:   Problem List Items Addressed This Visit    None    Visit Diagnoses    Encounter for pre-operative laboratory testing    -  Primary   Relevant Orders   Protime-INR (Completed)   Comprehensive metabolic panel (Completed)   Hemoglobin A1c (Completed)   TSH (Completed)   CBC with Differential (Completed)   Urinalysis      No orders of the defined types were placed in this encounter.   Follow-up: No follow-ups on file.   PLAN  Labs  collected  Will follow up as warranted regarding surgical clearance  Pt appears stable at this time. Will need cards clearance for surgery as well  Patient encouraged to call clinic with any questions, comments, or concerns.  Maximiano Coss, NP

## 2020-04-06 ENCOUNTER — Other Ambulatory Visit (HOSPITAL_COMMUNITY): Payer: Self-pay | Admitting: Cardiology

## 2020-04-06 ENCOUNTER — Other Ambulatory Visit (HOSPITAL_COMMUNITY)
Admission: RE | Admit: 2020-04-06 | Discharge: 2020-04-06 | Disposition: A | Discharge status: Home / Self Care | Payer: Medicare HMO | Source: Ambulatory Visit | Attending: Orthopedic Surgery | Admitting: Orthopedic Surgery

## 2020-04-06 DIAGNOSIS — Z01812 Encounter for preprocedural laboratory examination: Secondary | ICD-10-CM | POA: Diagnosis not present

## 2020-04-06 DIAGNOSIS — Z20822 Contact with and (suspected) exposure to covid-19: Secondary | ICD-10-CM | POA: Diagnosis not present

## 2020-04-06 LAB — SARS CORONAVIRUS 2 (TAT 6-24 HRS): SARS Coronavirus 2: NEGATIVE

## 2020-04-06 NOTE — Care Plan (Signed)
Ortho Bundle Case Management Note  Patient Details  Name: Patricia Wagner MRN: [id number] Date of Birth: 24-Jan-1951  Spoke with patient prior to surgery. SHe will discharge to home with family to assist. Has rolling walker. CPM ordered from [place]. HHPT referral to [facility] and OPPT set up with [facility].  Patient and MD in agreement with plan. CHoice offered                     DME Arranged:  CPM DME Agency:  [facility]  HH Arranged:  PT [place] Agency:  [facility] ([facility]  Additional Comments: Please contact me with any questions of if this plan should need to change.  Ladell Heads,  [place] Specialist  [phone] 04/06/2020, 11:41 AM

## 2020-04-09 MED ORDER — BUPIVACAINE LIPOSOME 1.3 % IJ SUSP
20.0000 mL | Freq: Once | INTRAMUSCULAR | Status: DC
  Filled 2020-04-09: qty 20

## 2020-04-10 ENCOUNTER — Observation Stay (HOSPITAL_COMMUNITY)
Admission: RE | Admit: 2020-04-10 | Discharge: 2020-04-11 | Disposition: A | Discharge status: Home / Self Care | Payer: Medicare HMO | Attending: Orthopedic Surgery | Admitting: Orthopedic Surgery

## 2020-04-10 ENCOUNTER — Encounter (HOSPITAL_COMMUNITY)
Admission: RE | Disposition: A | Discharge status: Home / Self Care | Payer: Self-pay | Source: Home / Self Care | Attending: Orthopedic Surgery

## 2020-04-10 ENCOUNTER — Other Ambulatory Visit: Payer: Self-pay

## 2020-04-10 ENCOUNTER — Observation Stay (HOSPITAL_COMMUNITY): Payer: Medicare HMO

## 2020-04-10 ENCOUNTER — Ambulatory Visit (HOSPITAL_COMMUNITY): Payer: Medicare HMO | Admitting: Physician Assistant

## 2020-04-10 ENCOUNTER — Ambulatory Visit (HOSPITAL_COMMUNITY): Payer: Medicare HMO | Admitting: Certified Registered Nurse Anesthetist

## 2020-04-10 ENCOUNTER — Encounter (HOSPITAL_COMMUNITY): Payer: Self-pay | Admitting: Orthopedic Surgery

## 2020-04-10 DIAGNOSIS — Z96651 Presence of right artificial knee joint: Secondary | ICD-10-CM | POA: Diagnosis not present

## 2020-04-10 DIAGNOSIS — Z79899 Other long term (current) drug therapy: Secondary | ICD-10-CM | POA: Insufficient documentation

## 2020-04-10 DIAGNOSIS — Z8719 Personal history of other diseases of the digestive system: Secondary | ICD-10-CM

## 2020-04-10 DIAGNOSIS — I110 Hypertensive heart disease with heart failure: Secondary | ICD-10-CM | POA: Diagnosis not present

## 2020-04-10 DIAGNOSIS — I10 Essential (primary) hypertension: Secondary | ICD-10-CM | POA: Diagnosis present

## 2020-04-10 DIAGNOSIS — I2510 Atherosclerotic heart disease of native coronary artery without angina pectoris: Secondary | ICD-10-CM | POA: Insufficient documentation

## 2020-04-10 DIAGNOSIS — E1122 Type 2 diabetes mellitus with diabetic chronic kidney disease: Secondary | ICD-10-CM | POA: Insufficient documentation

## 2020-04-10 DIAGNOSIS — Z87891 Personal history of nicotine dependence: Secondary | ICD-10-CM | POA: Insufficient documentation

## 2020-04-10 DIAGNOSIS — N183 Chronic kidney disease, stage 3 unspecified: Secondary | ICD-10-CM | POA: Insufficient documentation

## 2020-04-10 DIAGNOSIS — Z6833 Body mass index (BMI) 33.0-33.9, adult: Secondary | ICD-10-CM | POA: Insufficient documentation

## 2020-04-10 DIAGNOSIS — Z96659 Presence of unspecified artificial knee joint: Secondary | ICD-10-CM

## 2020-04-10 DIAGNOSIS — M1711 Unilateral primary osteoarthritis, right knee: Secondary | ICD-10-CM | POA: Diagnosis not present

## 2020-04-10 DIAGNOSIS — I130 Hypertensive heart and chronic kidney disease with heart failure and stage 1 through stage 4 chronic kidney disease, or unspecified chronic kidney disease: Secondary | ICD-10-CM | POA: Insufficient documentation

## 2020-04-10 DIAGNOSIS — Z7901 Long term (current) use of anticoagulants: Secondary | ICD-10-CM

## 2020-04-10 DIAGNOSIS — I5042 Chronic combined systolic (congestive) and diastolic (congestive) heart failure: Secondary | ICD-10-CM | POA: Diagnosis not present

## 2020-04-10 DIAGNOSIS — I509 Heart failure, unspecified: Secondary | ICD-10-CM

## 2020-04-10 DIAGNOSIS — I482 Chronic atrial fibrillation, unspecified: Secondary | ICD-10-CM | POA: Diagnosis present

## 2020-04-10 DIAGNOSIS — E785 Hyperlipidemia, unspecified: Secondary | ICD-10-CM | POA: Diagnosis not present

## 2020-04-10 DIAGNOSIS — Z8673 Personal history of transient ischemic attack (TIA), and cerebral infarction without residual deficits: Secondary | ICD-10-CM | POA: Insufficient documentation

## 2020-04-10 DIAGNOSIS — I5022 Chronic systolic (congestive) heart failure: Secondary | ICD-10-CM | POA: Diagnosis not present

## 2020-04-10 DIAGNOSIS — G8918 Other acute postprocedural pain: Secondary | ICD-10-CM | POA: Diagnosis not present

## 2020-04-10 DIAGNOSIS — Z471 Aftercare following joint replacement surgery: Secondary | ICD-10-CM | POA: Diagnosis not present

## 2020-04-10 DIAGNOSIS — I5043 Acute on chronic combined systolic (congestive) and diastolic (congestive) heart failure: Secondary | ICD-10-CM | POA: Diagnosis present

## 2020-04-10 HISTORY — PX: TOTAL KNEE ARTHROPLASTY: SHX125

## 2020-04-10 SURGERY — ARTHROPLASTY, KNEE, TOTAL
Anesthesia: Regional | Site: Knee | Laterality: Right

## 2020-04-10 MED ORDER — BUPIVACAINE LIPOSOME 1.3 % IJ SUSP
INTRAMUSCULAR | Status: DC | PRN
  Administered 2020-04-10: 20 mL

## 2020-04-10 MED ORDER — POVIDONE-IODINE 10 % EX SWAB: 2.0000 "application " | Freq: Once | CUTANEOUS | Status: DC

## 2020-04-10 MED ORDER — CEFAZOLIN SODIUM-DEXTROSE 1-4 GM/50ML-% IV SOLN
1.0000 g | Freq: Four times a day (QID) | INTRAVENOUS | Status: AC
  Administered 2020-04-11: 1 g via INTRAVENOUS
  Filled 2020-04-10: qty 50

## 2020-04-10 MED ORDER — KETOROLAC TROMETHAMINE 15 MG/ML IJ SOLN
7.5000 mg | Freq: Four times a day (QID) | INTRAMUSCULAR | Status: DC
  Administered 2020-04-10 – 2020-04-11 (×2): 7.5 mg via INTRAVENOUS
  Filled 2020-04-10 (×2): qty 1

## 2020-04-10 MED ORDER — METOCLOPRAMIDE HCL 5 MG PO TABS: 5.0000 mg | ORAL_TABLET | Freq: Three times a day (TID) | ORAL | Status: DC | PRN

## 2020-04-10 MED ORDER — MAGNESIUM CITRATE PO SOLN: 1.0000 | Freq: Once | ORAL | Status: DC | PRN

## 2020-04-10 MED ORDER — POTASSIUM CHLORIDE CRYS ER 20 MEQ PO TBCR
40.0000 meq | EXTENDED_RELEASE_TABLET | Freq: Every day | ORAL | Status: DC
  Administered 2020-04-11: 40 meq via ORAL
  Filled 2020-04-10: qty 2

## 2020-04-10 MED ORDER — LACTATED RINGERS IV SOLN: INTRAVENOUS | Status: DC

## 2020-04-10 MED ORDER — STERILE WATER FOR IRRIGATION IR SOLN
Status: DC | PRN
  Administered 2020-04-10: 2000 mL

## 2020-04-10 MED ORDER — RANOLAZINE ER 500 MG PO TB12
500.0000 mg | ORAL_TABLET | Freq: Two times a day (BID) | ORAL | Status: DC
  Administered 2020-04-10 – 2020-04-11 (×2): 500 mg via ORAL
  Filled 2020-04-10 (×2): qty 1

## 2020-04-10 MED ORDER — PHENYLEPHRINE 40 MCG/ML (10ML) SYRINGE FOR IV PUSH (FOR BLOOD PRESSURE SUPPORT)
PREFILLED_SYRINGE | INTRAVENOUS | Status: DC | PRN
  Administered 2020-04-10 (×2): 80 ug via INTRAVENOUS

## 2020-04-10 MED ORDER — MENTHOL 3 MG MT LOZG: 1.0000 | LOZENGE | OROMUCOSAL | Status: DC | PRN

## 2020-04-10 MED ORDER — SODIUM CHLORIDE 0.9% FLUSH
INTRAVENOUS | Status: DC | PRN
  Administered 2020-04-10: 30 mL

## 2020-04-10 MED ORDER — ONDANSETRON HCL 4 MG PO TABS: 4.0000 mg | ORAL_TABLET | Freq: Four times a day (QID) | ORAL | Status: DC | PRN

## 2020-04-10 MED ORDER — ACETAMINOPHEN 500 MG PO TABS
1000.0000 mg | ORAL_TABLET | Freq: Once | ORAL | Status: AC
  Administered 2020-04-10: 1000 mg via ORAL
  Filled 2020-04-10: qty 2

## 2020-04-10 MED ORDER — ONDANSETRON HCL 4 MG PO TABS: 4.0000 mg | ORAL_TABLET | Freq: Three times a day (TID) | ORAL | 0 refills | Status: DC | PRN

## 2020-04-10 MED ORDER — PROPOFOL 10 MG/ML IV BOLUS
INTRAVENOUS | Status: AC
  Filled 2020-04-10: qty 40

## 2020-04-10 MED ORDER — ACETAMINOPHEN 325 MG PO TABS: 325.0000 mg | ORAL_TABLET | Freq: Four times a day (QID) | ORAL | Status: DC | PRN

## 2020-04-10 MED ORDER — SODIUM CHLORIDE (PF) 0.9 % IJ SOLN
INTRAMUSCULAR | Status: AC
  Filled 2020-04-10: qty 50

## 2020-04-10 MED ORDER — ACETAMINOPHEN 500 MG PO TABS
1000.0000 mg | ORAL_TABLET | Freq: Three times a day (TID) | ORAL | Status: DC
  Administered 2020-04-10 – 2020-04-11 (×2): 1000 mg via ORAL
  Filled 2020-04-10 (×2): qty 2

## 2020-04-10 MED ORDER — ONDANSETRON HCL 4 MG/2ML IJ SOLN
INTRAMUSCULAR | Status: AC
  Filled 2020-04-10: qty 2

## 2020-04-10 MED ORDER — GABAPENTIN 100 MG PO CAPS
100.0000 mg | ORAL_CAPSULE | Freq: Three times a day (TID) | ORAL | Status: DC
  Administered 2020-04-10 – 2020-04-11 (×2): 100 mg via ORAL
  Filled 2020-04-10 (×2): qty 1

## 2020-04-10 MED ORDER — 0.9 % SODIUM CHLORIDE (POUR BTL) OPTIME
TOPICAL | Status: DC | PRN
  Administered 2020-04-10: 1000 mL

## 2020-04-10 MED ORDER — DEXAMETHASONE SODIUM PHOSPHATE 10 MG/ML IJ SOLN
10.0000 mg | Freq: Once | INTRAMUSCULAR | Status: AC
  Administered 2020-04-11: 10 mg via INTRAVENOUS
  Filled 2020-04-10: qty 1

## 2020-04-10 MED ORDER — GABAPENTIN 100 MG PO CAPS
100.0000 mg | ORAL_CAPSULE | Freq: Two times a day (BID) | ORAL | 0 refills | Status: DC
Start: 2020-04-10 — End: 2020-05-19

## 2020-04-10 MED ORDER — TRANEXAMIC ACID-NACL 1000-0.7 MG/100ML-% IV SOLN: 1000.0000 mg | Freq: Once | INTRAVENOUS | Status: DC

## 2020-04-10 MED ORDER — HYDROMORPHONE HCL 1 MG/ML IJ SOLN: 0.5000 mg | INTRAMUSCULAR | Status: DC | PRN

## 2020-04-10 MED ORDER — POLYETHYLENE GLYCOL 3350 17 G PO PACK: 17.0000 g | PACK | Freq: Every day | ORAL | Status: DC | PRN

## 2020-04-10 MED ORDER — OXYCODONE HCL 5 MG PO TABS: 5.0000 mg | ORAL_TABLET | ORAL | 0 refills | Status: AC | PRN

## 2020-04-10 MED ORDER — ACETAMINOPHEN 500 MG PO TABS: 1000.0000 mg | ORAL_TABLET | Freq: Three times a day (TID) | ORAL | 0 refills | Status: AC

## 2020-04-10 MED ORDER — ONDANSETRON HCL 4 MG/2ML IJ SOLN: 4.0000 mg | Freq: Four times a day (QID) | INTRAMUSCULAR | Status: DC | PRN

## 2020-04-10 MED ORDER — FENTANYL CITRATE (PF) 100 MCG/2ML IJ SOLN
INTRAMUSCULAR | Status: AC
  Filled 2020-04-10: qty 2

## 2020-04-10 MED ORDER — ROSUVASTATIN CALCIUM 10 MG PO TABS
10.0000 mg | ORAL_TABLET | Freq: Every day | ORAL | Status: DC
  Filled 2020-04-10: qty 1

## 2020-04-10 MED ORDER — OXYCODONE HCL 5 MG PO TABS
5.0000 mg | ORAL_TABLET | ORAL | Status: DC | PRN
  Administered 2020-04-11: 5 mg via ORAL
  Filled 2020-04-10: qty 1

## 2020-04-10 MED ORDER — FENTANYL CITRATE (PF) 100 MCG/2ML IJ SOLN
50.0000 ug | INTRAMUSCULAR | Status: DC
  Administered 2020-04-10: 100 ug via INTRAVENOUS
  Filled 2020-04-10 (×2): qty 2

## 2020-04-10 MED ORDER — METOCLOPRAMIDE HCL 5 MG/ML IJ SOLN: 5.0000 mg | Freq: Three times a day (TID) | INTRAMUSCULAR | Status: DC | PRN

## 2020-04-10 MED ORDER — MIDAZOLAM HCL 2 MG/2ML IJ SOLN
1.0000 mg | INTRAMUSCULAR | Status: DC
  Administered 2020-04-10: 2 mg via INTRAVENOUS
  Filled 2020-04-10: qty 2

## 2020-04-10 MED ORDER — LIDOCAINE 2% (20 MG/ML) 5 ML SYRINGE
INTRAMUSCULAR | Status: DC | PRN
  Administered 2020-04-10: 20 mg via INTRAVENOUS

## 2020-04-10 MED ORDER — DIPHENHYDRAMINE HCL 12.5 MG/5ML PO ELIX: 12.5000 mg | ORAL_SOLUTION | ORAL | Status: DC | PRN

## 2020-04-10 MED ORDER — METHOCARBAMOL 500 MG PO TABS
500.0000 mg | ORAL_TABLET | Freq: Four times a day (QID) | ORAL | Status: DC | PRN
  Administered 2020-04-10 – 2020-04-11 (×2): 500 mg via ORAL
  Filled 2020-04-10 (×2): qty 1

## 2020-04-10 MED ORDER — CEFAZOLIN SODIUM-DEXTROSE 1-4 GM/50ML-% IV SOLN: 1.0000 g | Freq: Four times a day (QID) | INTRAVENOUS | Status: DC

## 2020-04-10 MED ORDER — DEXAMETHASONE SODIUM PHOSPHATE 10 MG/ML IJ SOLN
INTRAMUSCULAR | Status: AC
  Filled 2020-04-10: qty 1

## 2020-04-10 MED ORDER — HYDROMORPHONE HCL 1 MG/ML IJ SOLN
INTRAMUSCULAR | Status: DC | PRN
  Administered 2020-04-10 (×4): .4 mg via INTRAVENOUS

## 2020-04-10 MED ORDER — DEXAMETHASONE SODIUM PHOSPHATE 10 MG/ML IJ SOLN
INTRAMUSCULAR | Status: DC | PRN
  Administered 2020-04-10: 10 mg via INTRAVENOUS

## 2020-04-10 MED ORDER — ISOSORBIDE MONONITRATE ER 30 MG PO TB24
15.0000 mg | ORAL_TABLET | Freq: Every day | ORAL | Status: DC
  Filled 2020-04-10: qty 1

## 2020-04-10 MED ORDER — APIXABAN 5 MG PO TABS
5.0000 mg | ORAL_TABLET | Freq: Two times a day (BID) | ORAL | Status: DC
  Administered 2020-04-11: 5 mg via ORAL
  Filled 2020-04-10: qty 1

## 2020-04-10 MED ORDER — PHENOL 1.4 % MT LIQD: 1.0000 | OROMUCOSAL | Status: DC | PRN

## 2020-04-10 MED ORDER — CEFAZOLIN SODIUM-DEXTROSE 2-4 GM/100ML-% IV SOLN
2.0000 g | INTRAVENOUS | Status: AC
  Administered 2020-04-10: 2 g via INTRAVENOUS
  Filled 2020-04-10: qty 100

## 2020-04-10 MED ORDER — DOCUSATE SODIUM 100 MG PO CAPS
100.0000 mg | ORAL_CAPSULE | Freq: Two times a day (BID) | ORAL | Status: DC
  Administered 2020-04-10 – 2020-04-11 (×2): 100 mg via ORAL
  Filled 2020-04-10 (×2): qty 1

## 2020-04-10 MED ORDER — SORBITOL 70 % SOLN
30.0000 mL | Freq: Every day | Status: DC | PRN
  Filled 2020-04-10: qty 30

## 2020-04-10 MED ORDER — POVIDONE-IODINE 10 % EX SWAB
2.0000 "application " | Freq: Once | CUTANEOUS | Status: AC
  Administered 2020-04-10: 2 via TOPICAL

## 2020-04-10 MED ORDER — PHENYLEPHRINE HCL-NACL 10-0.9 MG/250ML-% IV SOLN
INTRAVENOUS | Status: DC | PRN
  Administered 2020-04-10: 50 ug/min via INTRAVENOUS

## 2020-04-10 MED ORDER — BACLOFEN 10 MG PO TABS
10.0000 mg | ORAL_TABLET | Freq: Three times a day (TID) | ORAL | 0 refills | Status: DC | PRN
Start: 2020-04-10 — End: 2020-05-19

## 2020-04-10 MED ORDER — TRANEXAMIC ACID-NACL 1000-0.7 MG/100ML-% IV SOLN
1000.0000 mg | INTRAVENOUS | Status: AC
  Administered 2020-04-10: 1000 mg via INTRAVENOUS
  Filled 2020-04-10: qty 100

## 2020-04-10 MED ORDER — ROPIVACAINE HCL 5 MG/ML IJ SOLN
INTRAMUSCULAR | Status: DC | PRN
Start: 2020-04-10 — End: 2020-04-10
  Administered 2020-04-10: 20 mL via PERINEURAL

## 2020-04-10 MED ORDER — METHOCARBAMOL 500 MG IVPB - SIMPLE MED
500.0000 mg | Freq: Four times a day (QID) | INTRAVENOUS | Status: DC | PRN
  Filled 2020-04-10: qty 50

## 2020-04-10 MED ORDER — PROPOFOL 10 MG/ML IV BOLUS
INTRAVENOUS | Status: DC | PRN
  Administered 2020-04-10: 200 mg via INTRAVENOUS

## 2020-04-10 MED ORDER — FENTANYL CITRATE (PF) 100 MCG/2ML IJ SOLN
25.0000 ug | INTRAMUSCULAR | Status: DC | PRN
  Administered 2020-04-10: 25 ug via INTRAVENOUS
  Administered 2020-04-10: 50 ug via INTRAVENOUS

## 2020-04-10 MED ORDER — FENTANYL CITRATE (PF) 100 MCG/2ML IJ SOLN
INTRAMUSCULAR | Status: DC | PRN
  Administered 2020-04-10 (×2): 50 ug via INTRAVENOUS

## 2020-04-10 MED ORDER — ONDANSETRON HCL 4 MG/2ML IJ SOLN
INTRAMUSCULAR | Status: DC | PRN
  Administered 2020-04-10: 4 mg via INTRAVENOUS

## 2020-04-10 MED ORDER — HYDROMORPHONE HCL 2 MG/ML IJ SOLN
INTRAMUSCULAR | Status: AC
  Filled 2020-04-10: qty 1

## 2020-04-10 MED ORDER — PANTOPRAZOLE SODIUM 40 MG PO TBEC
40.0000 mg | DELAYED_RELEASE_TABLET | Freq: Every day | ORAL | Status: DC
  Filled 2020-04-10: qty 1

## 2020-04-10 MED ORDER — FUROSEMIDE 40 MG PO TABS
80.0000 mg | ORAL_TABLET | Freq: Every day | ORAL | Status: DC
  Administered 2020-04-11: 80 mg via ORAL
  Filled 2020-04-10: qty 2

## 2020-04-10 MED ORDER — DEXAMETHASONE SODIUM PHOSPHATE 10 MG/ML IJ SOLN
INTRAMUSCULAR | Status: DC | PRN
  Administered 2020-04-10: 5 mg

## 2020-04-10 MED ORDER — SODIUM CHLORIDE 0.9 % IR SOLN
Status: DC | PRN
  Administered 2020-04-10: 1000 mL

## 2020-04-10 SURGICAL SUPPLY — 47 items
BLADE HEX COATED 2.75 (ELECTRODE) ×2 IMPLANT
BLADE SAG 18X100X1.27 (BLADE) ×2 IMPLANT
BLADE SAGITTAL 25.0X1.37X90 (BLADE) ×2 IMPLANT
BLADE SURG 15 STRL LF DISP TIS (BLADE) ×1 IMPLANT
BLADE SURG 15 STRL SS (BLADE) ×2
BLADE SURG SZ10 CARB STEEL (BLADE) ×4 IMPLANT
BNDG ELASTIC 6X10 VLCR STRL LF (GAUZE/BANDAGES/DRESSINGS) ×2 IMPLANT
BOWL SMART MIX CTS (DISPOSABLE) IMPLANT
CLSR STERI-STRIP ANTIMIC 1/2X4 (GAUZE/BANDAGES/DRESSINGS) ×2 IMPLANT
COMPONENT TRI CR RETAIN KNEE (Orthopedic Implant) ×1 IMPLANT
COVER SURGICAL LIGHT HANDLE (MISCELLANEOUS) ×2 IMPLANT
COVER WAND RF STERILE (DRAPES) IMPLANT
CUFF TOURN SGL QUICK 34 (TOURNIQUET CUFF) ×2
CUFF TRNQT CYL 34X4.125X (TOURNIQUET CUFF) ×1 IMPLANT
DECANTER SPIKE VIAL GLASS SM (MISCELLANEOUS) ×2 IMPLANT
DRAPE U-SHAPE 47X51 STRL (DRAPES) ×2 IMPLANT
DRSG MEPILEX BORDER 4X12 (GAUZE/BANDAGES/DRESSINGS) ×2 IMPLANT
DURAPREP 26ML APPLICATOR (WOUND CARE) ×4 IMPLANT
GLOVE BIO SURGEON STRL SZ7.5 (GLOVE) ×4 IMPLANT
GLOVE BIOGEL PI IND STRL 8 (GLOVE) ×2 IMPLANT
GLOVE BIOGEL PI INDICATOR 8 (GLOVE) ×2
GOWN STRL REUS W/ TWL LRG LVL3 (GOWN DISPOSABLE) ×2 IMPLANT
GOWN STRL REUS W/TWL LRG LVL3 (GOWN DISPOSABLE) ×4
HANDPIECE INTERPULSE COAX TIP (DISPOSABLE) ×2
HOLDER FOLEY CATH W/STRAP (MISCELLANEOUS) IMPLANT
IMMOBILIZER KNEE 22 UNIV (SOFTGOODS) ×2 IMPLANT
INSERT TIB BEARING SZ3 11 (Miscellaneous) ×2 IMPLANT
KIT TURNOVER KIT A (KITS) IMPLANT
KNEE PATELLA ASYMMETRIC 9X29 (Knees) ×2 IMPLANT
KNEE TIBIAL COMPONENT SZ3 (Knees) ×2 IMPLANT
MANIFOLD NEPTUNE II (INSTRUMENTS) ×2 IMPLANT
NS IRRIG 1000ML POUR BTL (IV SOLUTION) ×2 IMPLANT
PACK ICE MAXI GEL EZY WRAP (MISCELLANEOUS) ×2 IMPLANT
PACK TOTAL KNEE CUSTOM (KITS) ×2 IMPLANT
PENCIL SMOKE EVACUATOR (MISCELLANEOUS) IMPLANT
PIN FLUTED HEDLESS FIX 3.5X1/8 (PIN) ×2 IMPLANT
PROTECTOR NERVE ULNAR (MISCELLANEOUS) ×2 IMPLANT
SET HNDPC FAN SPRY TIP SCT (DISPOSABLE) ×1 IMPLANT
SUT MNCRL AB 4-0 PS2 18 (SUTURE) ×2 IMPLANT
SUT VIC AB 0 CT1 36 (SUTURE) ×2 IMPLANT
SUT VIC AB 1 CT1 36 (SUTURE) ×4 IMPLANT
SUT VIC AB 2-0 CT1 27 (SUTURE) ×2
SUT VIC AB 2-0 CT1 TAPERPNT 27 (SUTURE) ×1 IMPLANT
TRAY FOLEY MTR SLVR 14FR STAT (SET/KITS/TRAYS/PACK) ×2 IMPLANT
TRIA CRUCIATE RETAIN KNEE (Orthopedic Implant) ×2 IMPLANT
WRAP KNEE MAXI GEL POST OP (GAUZE/BANDAGES/DRESSINGS) ×2 IMPLANT
YANKAUER SUCT BULB TIP 10FT TU (MISCELLANEOUS) ×2 IMPLANT

## 2020-04-10 NOTE — Progress Notes (Signed)
AssistedDr. Chelsey Woodrum with right, ultrasound guided, adductor canal block. Side rails up, monitors on throughout procedure. See vital signs in flow sheet. Tolerated Procedure well.  

## 2020-04-10 NOTE — Progress Notes (Signed)
Orthopedic Tech Progress Note Patient Details:  Patricia Wagner 10-27-1951 [id number]  Patient ID: Patricia Wagner, female   DOB: [birth year], 69 y.o.   MRN: [id number]   [name] 04/10/2020, 6:01 PM cpm removed. Pt leg placed in bone foam

## 2020-04-10 NOTE — Evaluation (Signed)
Physical Therapy Evaluation Patient Details Name: Patricia Wagner MRN: [id number] DOB: July 28, 1951 Today's Date: 04/10/2020   History of Present Illness  s/p R TKA. PMH: afib, CHF, HTN, CAD  Clinical Impression  Pt is s/p TKA resulting in the deficits listed below (see PT Problem List).  PT amb ~ 50 with RW , min/guard assist and incr time. anticipate steady progress in acute setting.  Pt will benefit from skilled PT to increase their independence and safety with mobility to allow discharge to the venue listed below.      Follow Up Recommendations Follow surgeon's recommendation for DC plan and follow-up therapies    Equipment Recommendations  None recommended by PT    Recommendations for Other Services       Precautions / Restrictions Precautions Precautions: Fall;Knee Required Braces or Orthoses: Knee Immobilizer - Right Restrictions Weight Bearing Restrictions: No Other Position/Activity Restrictions: WBAT      Mobility  Bed Mobility Overal bed mobility: Needs Assistance Bed Mobility: Supine to Sit     Supine to sit: Min guard     General bed mobility comments: incr time and cues for self assist, pt motivated to perform on her own  Transfers Overall transfer level: Needs assistance Equipment used: Rolling walker (2 wheeled) Transfers: Sit to/from Stand Sit to Stand: Min assist;From elevated surface         General transfer comment: cues for hand placement and RLE position  Ambulation/Gait Ambulation/Gait assistance: Min assist;Min guard Gait Distance (Feet): 50 Feet Assistive device: Rolling walker (2 wheeled) Gait Pattern/deviations: Step-to pattern;Decreased step length - right;Decreased step length - left;Decreased stance time - right     General Gait Details: cues for sequence and RW position  Stairs            Wheelchair Mobility    Modified Rankin (Stroke Patients Only)       Balance                                              Pertinent Vitals/Pain Pain Assessment: 0-10 Pain Score: 4  Pain Location: right knee Pain Descriptors / Indicators: Grimacing Pain Intervention(s): Premedicated before session;Monitored during session;Limited activity within patient's tolerance;Repositioned    Home Living Family/patient expects to be discharged to:: Private residence Living Arrangements: Alone                    Prior Function                 Hand Dominance        Extremity/Trunk Assessment   Upper Extremity Assessment Upper Extremity Assessment: Overall WFL for tasks assessed    Lower Extremity Assessment Lower Extremity Assessment: RLE deficits/detail RLE Deficits / Details: ankle WFL,  knee and hip grossly 2+/5, limited by post op pain and weakness       Communication      Cognition Arousal/Alertness: Awake/alert Behavior During Therapy: WFL for tasks assessed/performed Overall Cognitive Status: Within Functional Limits for tasks assessed                                        General Comments      Exercises Total Joint Exercises Ankle Circles/Pumps: AROM;10 reps;Both   Assessment/Plan    PT Assessment Patient needs continued  PT services  PT Problem List Decreased strength;Decreased range of motion;Decreased activity tolerance;Decreased mobility;Decreased knowledge of use of DME;Pain       PT Treatment Interventions DME instruction;Gait training;Functional mobility training;Therapeutic activities;Stair training;Therapeutic exercise;Patient/family education    PT Goals (Current goals can be found in the Care Plan section)  Acute Rehab PT Goals Patient Stated Goal: less knee pain with walking PT Goal Formulation: With patient Time For Goal Achievement: 04/10/20 Potential to Achieve Goals: Good    Frequency 7X/week   Barriers to discharge        Co-evaluation               AM-PAC PT "6 Clicks" Mobility  Outcome Measure Help needed  turning from your back to your side while in a flat bed without using bedrails?: A Little Help needed moving from lying on your back to sitting on the side of a flat bed without using bedrails?: A Little Help needed moving to and from a bed to a chair (including a wheelchair)?: A Little Help needed standing up from a chair using your arms (e.g., wheelchair or bedside chair)?: A Little Help needed to walk in hospital room?: A Little Help needed climbing 3-5 steps with a railing? : A Lot 6 Click Score: 17    End of Session Equipment Utilized During Treatment: Gait belt;Right knee immobilizer Activity Tolerance: Patient tolerated treatment well Patient left: in chair;with call bell/phone within reach;with chair alarm set   PT Visit Diagnosis: Difficulty in walking, not elsewhere classified (R26.2)    Time: 3299-2426 PT Time Calculation (min) (ACUTE ONLY): 33 min   Charges:   PT Evaluation $PT Eval Low Complexity: 1 Low PT Treatments $Gait Training: 8-22 mins        Baxter Flattery, PT   Acute Rehab Dept [facility]): [phone]   04/10/2020   [facility] 04/10/2020, 6:41 PM

## 2020-04-10 NOTE — Discharge Instructions (Signed)

## 2020-04-10 NOTE — Op Note (Signed)
DATE OF SURGERY:  04/10/2020 TIME: 11:28 AM  PATIENT NAME:  Patricia Wagner   AGE: 69 y.o.    PRE-OPERATIVE DIAGNOSIS:  OSTEOARTHRITIS RIGHT KNEE  POST-OPERATIVE DIAGNOSIS:  Same  PROCEDURE:  Procedure(s): TOTAL KNEE ARTHROPLASTY   SURGEON:  Renette Butters, MD   ASSISTANT:  Roxan Hockey, PA-C, he was present and scrubbed throughout the case, critical for completion in a timely fashion, and for retraction, instrumentation, and closure.    OPERATIVE IMPLANTS: Stryker Triathlon CR. Press fit knee  Femur size 3, Tibia size 3, Patella size 29 3-peg oval button, with a 11 mm polyethylene insert.   PREOPERATIVE INDICATIONS:  Patricia Wagner is a 69 y.o. year old female with end stage bone on bone degenerative arthritis of the knee who failed conservative treatment, including injections, antiinflammatories, activity modification, and assistive devices, and had significant impairment of their activities of daily living, and elected for Total Knee Arthroplasty.   The risks, benefits, and alternatives were discussed at length including but not limited to the risks of infection, bleeding, nerve injury, stiffness, blood clots, the need for revision surgery, cardiopulmonary complications, among others, and they were willing to proceed.   OPERATIVE DESCRIPTION:  The patient was brought to the operative room and placed in a supine position.  General anesthesia was administered.  IV antibiotics were given.  The lower extremity was prepped and draped in the usual sterile fashion.  Time out was performed.  The leg was elevated and exsanguinated and the tourniquet was inflated.  Anterior approach was performed.  The patella was everted and osteophytes were removed.  The anterior horn of the medial and lateral meniscus was removed.   The distal femur was opened with the drill and the intramedullary distal femoral cutting jig was utilized, set at 5 degrees resecting 8 mm off the distal femur.  Care was  taken to protect the collateral ligaments.  The distal femoral sizing jig was applied, taking care to avoid notching.  Then the 4-in-1 cutting jig was applied and the anterior and posterior femur was cut, along with the chamfer cuts.  All posterior osteophytes were removed.  The flexion gap was then measured and was symmetric with the extension gap.  Then the extramedullary tibial cutting jig was utilized making the appropriate cut using the anterior tibial crest as a reference building in appropriate posterior slope.  Care was taken during the cut to protect the medial and collateral ligaments.  The proximal tibia was removed along with the posterior horns of the menisci.  The PCL was sacrificed.    The extensor gap was measured and was approximately 23mm.    I completed the distal femoral preparation using the appropriate jig to prepare the box.  The patella was then measured, and cut with the saw.    The proximal tibia sized and prepared accordingly with the reamer and the punch, and then all components were trialed with the above sized poly insert.  The knee was found to have excellent balance and full motion.    The above named components were then impacted into place and Poly tibial piece and patella were inserted.  I was very happy with his stability and ROM  I performed a periarticular injection with marcaine and toradol  The knee was easily taken through a range of motion and the patella tracked well and the knee irrigated copiously and the parapatellar and subcutaneous tissue closed with vicryl, and monocryl with steri strips for the skin.  The  incision was dressed with sterile gauze and the tourniquet released and the patient was awakened and returned to the PACU in stable and satisfactory condition.  There were no complications.  Total tourniquet time was roughly 70 minutes.   POSTOPERATIVE PLAN: post op Abx, DVT px: SCD's, TED's, Early ambulation and chemical px

## 2020-04-10 NOTE — Interval H&P Note (Signed)
I participated in the care of this patient and agree with the above history, physical and evaluation. I performed a review of the history and a physical exam as detailed   Avannah Decker Daniel Anniebelle Devore MD  

## 2020-04-10 NOTE — Anesthesia Procedure Notes (Signed)
Anesthesia Regional Block: Adductor canal block   Pre-Anesthetic Checklist: ,, timeout performed, Correct Patient, Correct Site, Correct Laterality, Correct Procedure, Correct Position, site marked, Risks and benefits discussed,  Surgical consent,  Pre-op evaluation,  At surgeon's request and post-op pain management  Laterality: Right  Prep: Maximum Sterile Barrier Precautions used, chloraprep       Needles:  Injection technique: Single-shot  Needle Type: Echogenic Stimulator Needle     Needle Length: 9cm  Needle Gauge: 21     Additional Needles:   Procedures:,,,, ultrasound used (permanent image in chart),,,,  Narrative:  Start time: 04/10/2020 9:06 AM End time: 04/10/2020 9:16 AM Injection made incrementally with aspirations every 5 mL.  Performed by: Personally  Anesthesiologist: Freddrick March, MD  Additional Notes: Monitors applied. No increased pain on injection. No increased resistance to injection. Injection made in 5cc increments. Good needle visualization. Patient tolerated procedure well.

## 2020-04-10 NOTE — Transfer of Care (Signed)
Immediate Anesthesia Transfer of Care Note  Patient: Patricia Wagner  Procedure(s) Performed: TOTAL KNEE ARTHROPLASTY (Right Knee)  Patient Location: PACU  Anesthesia Type:GA combined with regional for post-op pain  Level of Consciousness: awake and patient cooperative  Airway & Oxygen Therapy: Patient Spontanous Breathing and Patient connected to face mask oxygen  Post-op Assessment: Report given to RN and Post -op Vital signs reviewed and stable  Post vital signs: Reviewed and stable  Last Vitals:  Vitals Value Taken Time  BP 167/88 04/10/20 1205  Temp 36.8 C 04/10/20 1205  Pulse 66 04/10/20 1209  Resp 11 04/10/20 1209  SpO2 100 % 04/10/20 1209  Vitals shown include unvalidated device data.  Last Pain:  Vitals:   04/10/20 0911  TempSrc: Oral  PainSc: 0-No pain         Complications: No apparent anesthesia complications

## 2020-04-10 NOTE — Anesthesia Procedure Notes (Signed)
Procedure Name: LMA Insertion Performed by: West Pugh, CRNA Pre-anesthesia Checklist: Patient identified, Emergency Drugs available, Suction available, Patient being monitored and Timeout performed Patient Re-evaluated:Patient Re-evaluated prior to induction Oxygen Delivery Method: Circle system utilized Preoxygenation: Pre-oxygenation with 100% oxygen Induction Type: IV induction Ventilation: Mask ventilation without difficulty LMA: LMA with gastric port inserted LMA Size: 4.0 Number of attempts: 1 Placement Confirmation: positive ETCO2 and breath sounds checked- equal and bilateral Tube secured with: Tape Dental Injury: Teeth and Oropharynx as per pre-operative assessment

## 2020-04-11 ENCOUNTER — Encounter: Payer: Self-pay | Admitting: *Deleted

## 2020-04-11 DIAGNOSIS — Z8673 Personal history of transient ischemic attack (TIA), and cerebral infarction without residual deficits: Secondary | ICD-10-CM | POA: Diagnosis not present

## 2020-04-11 DIAGNOSIS — I2510 Atherosclerotic heart disease of native coronary artery without angina pectoris: Secondary | ICD-10-CM | POA: Diagnosis not present

## 2020-04-11 DIAGNOSIS — N183 Chronic kidney disease, stage 3 unspecified: Secondary | ICD-10-CM | POA: Diagnosis not present

## 2020-04-11 DIAGNOSIS — Z7901 Long term (current) use of anticoagulants: Secondary | ICD-10-CM | POA: Diagnosis not present

## 2020-04-11 DIAGNOSIS — I482 Chronic atrial fibrillation, unspecified: Secondary | ICD-10-CM | POA: Diagnosis not present

## 2020-04-11 DIAGNOSIS — M1711 Unilateral primary osteoarthritis, right knee: Secondary | ICD-10-CM | POA: Diagnosis not present

## 2020-04-11 DIAGNOSIS — I130 Hypertensive heart and chronic kidney disease with heart failure and stage 1 through stage 4 chronic kidney disease, or unspecified chronic kidney disease: Secondary | ICD-10-CM | POA: Diagnosis not present

## 2020-04-11 DIAGNOSIS — E785 Hyperlipidemia, unspecified: Secondary | ICD-10-CM | POA: Diagnosis not present

## 2020-04-11 DIAGNOSIS — E1122 Type 2 diabetes mellitus with diabetic chronic kidney disease: Secondary | ICD-10-CM | POA: Diagnosis not present

## 2020-04-11 DIAGNOSIS — Z96651 Presence of right artificial knee joint: Secondary | ICD-10-CM | POA: Diagnosis not present

## 2020-04-11 MED ORDER — CHLORHEXIDINE GLUCONATE CLOTH 2 % EX PADS: 6.0000 | MEDICATED_PAD | Freq: Every day | CUTANEOUS | Status: DC

## 2020-04-11 MED ORDER — SULFAMETHOXAZOLE-TRIMETHOPRIM 800-160 MG PO TABS
1.0000 | ORAL_TABLET | Freq: Two times a day (BID) | ORAL | 0 refills | Status: AC
Start: 2020-04-11 — End: 2020-04-16

## 2020-04-11 NOTE — Plan of Care (Signed)
  Problem: Education: Goal: Knowledge of General Education information will improve Description: Including pain rating scale, medication(s)/side effects and non-pharmacologic comfort measures 04/11/2020 1520 by Delford Field, RN Outcome: Adequate for Discharge 04/11/2020 1306 by Delford Field, RN Outcome: Progressing   Problem: Health Behavior/Discharge Planning: Goal: Ability to manage health-related needs will improve 04/11/2020 1520 by Delford Field, RN Outcome: Adequate for Discharge 04/11/2020 1306 by Delford Field, RN Outcome: Progressing   Problem: Clinical Measurements: Goal: Ability to maintain clinical measurements within normal limits will improve 04/11/2020 1520 by Delford Field, RN Outcome: Adequate for Discharge 04/11/2020 1306 by Delford Field, RN Outcome: Progressing Goal: Will remain free from infection 04/11/2020 1520 by Delford Field, RN Outcome: Adequate for Discharge 04/11/2020 1306 by Delford Field, RN Outcome: Progressing Goal: Diagnostic test results will improve 04/11/2020 1520 by Delford Field, RN Outcome: Adequate for Discharge 04/11/2020 1306 by Delford Field, RN Outcome: Progressing Goal: Cardiovascular complication will be avoided 04/11/2020 1520 by Delford Field, RN Outcome: Adequate for Discharge 04/11/2020 1306 by Delford Field, RN Outcome: Progressing   Problem: Activity: Goal: Risk for activity intolerance will decrease 04/11/2020 1520 by Delford Field, RN Outcome: Adequate for Discharge 04/11/2020 1306 by Delford Field, RN Outcome: Progressing   Problem: Nutrition: Goal: Adequate nutrition will be maintained 04/11/2020 1520 by Delford Field, RN Outcome: Adequate for Discharge 04/11/2020 1306 by Delford Field, RN Outcome: Progressing   Problem: Coping: Goal: Level of anxiety will  decrease 04/11/2020 1520 by Delford Field, RN Outcome: Adequate for Discharge 04/11/2020 1306 by Delford Field, RN Outcome: Progressing   Problem: Elimination: Goal: Will not experience complications related to bowel motility 04/11/2020 1520 by Delford Field, RN Outcome: Adequate for Discharge 04/11/2020 1306 by Delford Field, RN Outcome: Progressing Goal: Will not experience complications related to urinary retention 04/11/2020 1520 by Delford Field, RN Outcome: Adequate for Discharge 04/11/2020 1306 by Delford Field, RN Outcome: Progressing   Problem: Pain Managment: Goal: General experience of comfort will improve 04/11/2020 1520 by Delford Field, RN Outcome: Adequate for Discharge 04/11/2020 1306 by Delford Field, RN Outcome: Progressing   Problem: Safety: Goal: Ability to remain free from injury will improve 04/11/2020 1520 by Delford Field, RN Outcome: Adequate for Discharge 04/11/2020 1306 by Delford Field, RN Outcome: Progressing   Problem: Skin Integrity: Goal: Risk for impaired skin integrity will decrease 04/11/2020 1520 by Delford Field, RN Outcome: Adequate for Discharge 04/11/2020 1306 by Delford Field, RN Outcome: Progressing

## 2020-04-11 NOTE — Progress Notes (Signed)
Reviewed AVS with patient, and daughter was in and out of the room.   All questions answered.  Patient alert and oriented , skin warm and dry.   Taken to main lobby and turned over to her daughter without concerns or complaints.

## 2020-04-11 NOTE — Discharge Summary (Signed)
Discharge Summary  Patient ID: Patricia Wagner MRN: [id number] DOB/AGE: 07/05/1951 69 y.o.  Admit date: 04/10/2020 Discharge date: 04/11/2020  Admission Diagnoses:  Primary osteoarthritis of right knee  Discharge Diagnoses:  Principal Problem:   Primary osteoarthritis of right knee Active Problems:   HLD (hyperlipidemia)   Essential hypertension   CHF (congestive heart failure) (HCC)   Chronic combined systolic (congestive) and diastolic (congestive) heart failure (HCC)   Chronic atrial fibrillation   Chronic anticoagulation   History of lower GI bleeding   Past Medical History:  Diagnosis Date  . Acute blood loss anemia 03/05/2018  . Acute hypercapnic respiratory failure ([place]) 04/30/2017  . Acute renal failure superimposed on chronic kidney disease ([place]) 10/20/2016  . Arthritis    hands, knees  . Atrial fibrillation ([place])    a. s/p multiple cardioversions; failed tikosyn/sotalol.  . Bradycardia 11/20/2016  . CAD in native artery, s/p cardiac cath with non obstructive CAD 10/24/2016  . Cerebrovascular accident (CVA) due to embolism of left cerebellar artery ([place])   . CHF (congestive heart failure) ([place])   . Chronic anticoagulation 03/15/2018  . Chronic atrial fibrillation ([place]) 03/05/2018  . Chronic systolic (congestive) heart failure ([place]) 03/05/2018  . CKD (chronic kidney disease) stage 3, GFR 30-59 ml/min   . Coagulopathy ([place]) 03/05/2018  . DIVERTICULITIS, HX OF 07/25/2007  . Diverticulosis of colon with hemorrhage 07/22/2007   Qualifier: Diagnosis of  By: [name]    . DIVERTICULOSIS, COLON 07/22/2007  . Dysuria 07/21/2017  . Edema, peripheral    a. chronic BLE edema, R>L. Prior trauma from dog attack and accident.  . Essential hypertension 07/22/2007   Qualifier: Diagnosis of  By: [name]    . History of kidney stones   . HLD (hyperlipidemia) 02/03/2008   Qualifier: Diagnosis of  By: [name], [name]   . HYPERLIPIDEMIA 02/03/2008  . Hypersomnia    declines  w/u  . Hypertension   . Hypotension (arterial) 04/30/2017  . MENOPAUSAL DISORDER 01/09/2011  . Morbid obesity ([place]) 07/22/2007  . NICM (nonischemic cardiomyopathy) ([place]) 10/24/2016  . PAF (paroxysmal atrial fibrillation) ([place])   . Presence of indwelling urinary catheter    supra pubic cath  . Raynaud's syndrome 07/22/2007  . Stroke ([place]) 2017  . Suspected sleep apnea 05/20/2017  . THYROID NODULE, RIGHT 01/04/2010  . VITAMIN D DEFICIENCY 01/09/2011   Qualifier: Diagnosis of  By: [name], [name]     Surgeries: Procedure(s): TOTAL KNEE ARTHROPLASTY on 04/10/2020   Consultants (if any):   Discharged Condition: Improved  Hospital Course: Patricia Wagner is an 69 y.o. female who was admitted 04/10/2020 with a diagnosis of Primary osteoarthritis of right knee and went to the operating room on 04/10/2020 and underwent the above named procedures.     She was given perioperative antibiotics:  Anti-infectives (From admission, onward)   Start     Dose/Rate Route Frequency Ordered Stop   04/11/20 0000  sulfamethoxazole-trimethoprim (BACTRIM DS) 800-160 MG tablet     1 tablet Oral 2 times daily 04/11/20 0757 04/16/20 2359   04/10/20 1845  ceFAZolin (ANCEF) IVPB 1 g/50 mL premix     1 g 100 mL/hr over 30 Minutes Intravenous Every 6 hours 04/10/20 1834 04/11/20 0644   04/10/20 1600  ceFAZolin (ANCEF) IVPB 1 g/50 mL premix  Status:  Discontinued     1 g 100 mL/hr over 30 Minutes Intravenous Every 6 hours 04/10/20 1217 04/10/20 1834   04/10/20 0745  ceFAZolin (ANCEF) IVPB  2g/100 mL premix     2 g 200 mL/hr over 30 Minutes Intravenous On call to O.R. 04/10/20 0740 04/10/20 1041    .  She was given sequential compression devices, early ambulation, and Eliquis was resumed for A. fib / DVT prophylaxis.  She benefited maximally from the hospital stay and there were no complications.    Recent vital signs:  Vitals:   04/11/20 0214 04/11/20 0549  BP: (!) 176/93 (!) 148/75  Pulse: (!) 55 (!) 57  Resp:  20 16  Temp: 98.2 F (36.8 C) 98.2 F (36.8 C)  SpO2: 97% 98%    Recent laboratory studies:  Lab Results  Component Value Date   HGB 12.1 03/30/2020   HGB 11.4 03/05/2020   HGB 9.8 (L) 02/06/2020   Lab Results  Component Value Date   WBC 6.8 03/30/2020   PLT 251 03/30/2020   Lab Results  Component Value Date   INR 1.1 03/05/2020   Lab Results  Component Value Date   NA 139 03/30/2020   K 4.2 03/30/2020   CL 100 03/30/2020   CO2 29 03/30/2020   BUN 28 (H) 03/30/2020   CREATININE 1.68 (H) 03/30/2020   GLUCOSE 93 03/30/2020    Discharge Medications:   Allergies as of 04/11/2020      Reactions   Ace Inhibitors Palpitations      Medication List    TAKE these medications   acetaminophen 500 MG tablet Commonly known as: TYLENOL Take 2 tablets (1,000 mg total) by mouth every 8 (eight) hours for 10 days. For Pain. What changed:   medication strength  how much to take  when to take this  reasons to take this  additional instructions   apixaban 5 MG Tabs tablet Commonly known as: Eliquis Take 1 tablet (5 mg total) by mouth 2 (two) times daily.   baclofen 10 MG tablet Commonly known as: LIORESAL Take 1 tablet (10 mg total) by mouth 3 (three) times daily as needed for muscle spasms.   furosemide 80 MG tablet Commonly known as: LASIX TAKE 1 TABLET BY MOUTH EVERY DAY   gabapentin 100 MG capsule Commonly known as: Neurontin Take 1 capsule (100 mg total) by mouth 2 (two) times daily for 14 days. For pain.   isosorbide mononitrate 30 MG 24 hr tablet Commonly known as: IMDUR TAKE HALF A TABLET BY MOUTH EVERY DAY What changed:   how much to take  how to take this  when to take this  additional instructions   ondansetron 4 MG tablet Commonly known as: Zofran Take 1 tablet (4 mg total) by mouth every 8 (eight) hours as needed for nausea or vomiting.   oxyCODONE 5 MG immediate release tablet Commonly known as: Roxicodone Take 1 tablet (5 mg total)  by mouth every 4 (four) hours as needed for up to 7 days for breakthrough pain.   pantoprazole 40 MG tablet Commonly known as: PROTONIX TAKE 1 TABLET BY MOUTH EVERY DAY   potassium chloride SA 20 MEQ tablet Commonly known as: Klor-Con M20 Take 2 tablets (40 mEq total) by mouth daily. Needs appt for further refills   ranolazine 500 MG 12 hr tablet Commonly known as: RANEXA TAKE 1 TABLET BY MOUTH TWICE A DAY   rosuvastatin 10 MG tablet Commonly known as: CRESTOR Take 1 tablet (10 mg total) by mouth daily.   sulfamethoxazole-trimethoprim 800-160 MG tablet Commonly known as: BACTRIM DS Take 1 tablet by mouth 2 (two) times daily for 5 days.  Vitamin D3 25 MCG (1000 UT) Caps Take 1,000 Units by mouth daily.   Voltaren 1 % Gel Generic drug: diclofenac Sodium Apply 1 application topically 2 (two) times daily as needed (Knee pain).       Diagnostic Studies: DG Knee Right Port  Result Date: 04/10/2020 CLINICAL DATA:  Right total knee replacement today. EXAM: PORTABLE RIGHT KNEE - 1-2 VIEW COMPARISON:  None. FINDINGS: Total knee arthroplasty in expected alignment. Is also patellofemoral arthroplasty. No periprosthetic lucency or fracture. Recent postsurgical change includes air and edema in the joint space and soft tissues. Vascular calcifications are seen. IMPRESSION: Total knee arthroplasty without immediate postoperative complication. Electronically Signed   By: Keith Rake M.D.   On: 04/10/2020 20:11    Disposition: Discharge disposition: 01-Home or Self Care       Discharge Instructions    Discharge patient   Complete by: As directed    Discharge disposition: 01-Home or Self Care   Discharge patient date: 04/11/2020      Follow-up Information    Renette Butters, MD. Go on 04/25/2020.   Specialty: Orthopedic Surgery Why: Your appointment is scheduled for 4:15.  Contact information: [address] [place] [address] [phone]         [facility] Follow up.   Specialty: [place] Why: HHPT will see you for 5 visits prior to starting outpatient physical therapy  Contact information: [address] [place] [address] [phone]        [facility], [place]. Go on 04/25/2020.   Why: You are scheduled to start outpatient physical therapy at 3:00. Please arrive at 2:30 to complete your paperwork  Contact information: [facility] [place] [address] [phone]        Renette Butters, MD.   Specialty: Orthopedic Surgery Contact information: [address] [place] [address] [phone]            Signed: Prudencio Burly III PA-C 04/11/2020, 8:04 AM

## 2020-04-11 NOTE — Plan of Care (Signed)

## 2020-04-11 NOTE — Progress Notes (Signed)
Physical Therapy Treatment Patient Details Name: Patricia Wagner MRN: [id number] DOB: 01-31-1951 Today's Date: 04/11/2020    History of Present Illness s/p R TKA. PMH: afib, CHF, HTN, CAD    PT Comments    Pt progressing well  Today. incr gait distance. Reviewed TKA, HEP. Ready for d/c with family support from PT standpoint. Reviewed placement and use of KI. Pt doing ind SLRs today    Follow Up Recommendations  Follow surgeon's recommendation for DC plan and follow-up therapies     Equipment Recommendations  None recommended by PT    Recommendations for Other Services       Precautions / Restrictions Precautions Precautions: Fall;Knee Required Braces or Orthoses: Knee Immobilizer - Right Restrictions Weight Bearing Restrictions: No Other Position/Activity Restrictions: WBAT    Mobility  Bed Mobility Overal bed mobility: Needs Assistance Bed Mobility: Supine to Sit     Supine to sit: Supervision     General bed mobility comments: incr time and cues for self assist, pt motivated to perform on her own  Transfers Overall transfer level: Needs assistance Equipment used: Rolling walker (2 wheeled) Transfers: Sit to/from Stand Sit to Stand: Supervision         General transfer comment: cues for hand placement and RLE position  Ambulation/Gait Ambulation/Gait assistance: Supervision;Min guard Gait Distance (Feet): 160 Feet Assistive device: Rolling walker (2 wheeled) Gait Pattern/deviations: Step-to pattern;Step-through pattern;Decreased stance time - right     General Gait Details: cues for sequence and RW position, gait progression   Stairs             Wheelchair Mobility    Modified Rankin (Stroke Patients Only)       Balance                                            Cognition Arousal/Alertness: Awake/alert Behavior During Therapy: WFL for tasks assessed/performed Overall Cognitive Status: Within Functional Limits for  tasks assessed                                        Exercises Total Joint Exercises Ankle Circles/Pumps: AROM;10 reps;Both Quad Sets: AROM;Both;10 reps Short Arc Quad: AROM;Right;10 reps Heel Slides: AROM;Right;AAROM;10 reps Hip ABduction/ADduction: AAROM;Right;10 reps Straight Leg Raises: AROM;Right;10 reps    General Comments        Pertinent Vitals/Pain Pain Assessment: 0-10 Pain Score: 4  Pain Location: right knee Pain Descriptors / Indicators: Sore Pain Intervention(s): Limited activity within patient's tolerance;Monitored during session;Premedicated before session    Home Living                      Prior Function            PT Goals (current goals can now be found in the care plan section) Acute Rehab PT Goals Patient Stated Goal: less knee pain with walking PT Goal Formulation: With patient Time For Goal Achievement: 04/10/20 Potential to Achieve Goals: Good Progress towards PT goals: Progressing toward goals    Frequency    7X/week      PT Plan Current plan remains appropriate    Co-evaluation              AM-PAC PT "6 Clicks" Mobility   Outcome Measure  Help needed turning  from your back to your side while in a flat bed without using bedrails?: A Little Help needed moving from lying on your back to sitting on the side of a flat bed without using bedrails?: None Help needed moving to and from a bed to a chair (including a wheelchair)?: A Little Help needed standing up from a chair using your arms (e.g., wheelchair or bedside chair)?: A Little Help needed to walk in hospital room?: A Little Help needed climbing 3-5 steps with a railing? : A Little 6 Click Score: 19    End of Session Equipment Utilized During Treatment: Gait belt Activity Tolerance: Patient tolerated treatment well Patient left: in chair;with call bell/phone within reach;with chair alarm set;with family/visitor present   PT Visit Diagnosis:  Difficulty in walking, not elsewhere classified (R26.2)     Time: 5102-5852 PT Time Calculation (min) (ACUTE ONLY): 35 min  Charges:  $Gait Training: 8-22 mins $Therapeutic Exercise: 8-22 mins                     Baxter Flattery, PT   Acute Rehab Dept [facility]): [phone]   04/11/2020    [facility] 04/11/2020, 1:37 PM

## 2020-04-11 NOTE — Progress Notes (Signed)
Patient has been on a chronic catheter for 2 years.   It was time for it to be changed, so physician ordered a new catheter to be placed prior to discharge.  Marda Stalker, NT, placed the catheter with the instruction of Thereasa Distance, RN.  A leg bag was attached.  I verified with 4th floor that this 16 fr. Catheter was sufficient for long term chronic use.  Urine return as expected.

## 2020-04-11 NOTE — Anesthesia Postprocedure Evaluation (Signed)
Anesthesia Post Note  Patient: Patricia Wagner  Procedure(s) Performed: TOTAL KNEE ARTHROPLASTY (Right Knee)     Patient location during evaluation: PACU Anesthesia Type: Regional and General Level of consciousness: awake and alert Pain management: pain level controlled Vital Signs Assessment: post-procedure vital signs reviewed and stable Respiratory status: spontaneous breathing, nonlabored ventilation, respiratory function stable and patient connected to nasal cannula oxygen Cardiovascular status: blood pressure returned to baseline and stable Postop Assessment: no apparent nausea or vomiting Anesthetic complications: no    Last Vitals:  Vitals:   04/11/20 0549 04/11/20 1053  BP: (!) 148/75 (!) 161/77  Pulse: (!) 57 66  Resp: 16 18  Temp: 36.8 C   SpO2: 98% 97%    Last Pain:  Vitals:   04/11/20 1030  TempSrc:   PainSc: 5                  Onnie Alatorre L Nguyen Todorov

## 2020-04-11 NOTE — Progress Notes (Signed)
Subjective: Patient reports pain as mild.  Tolerating diet. No CP, SOB.  Good early mobilization with therapy.  Desires discharge home today - will need chronic urinary catheter change.  Objective:   VITALS:   Vitals:   04/10/20 2120 04/11/20 0214 04/11/20 0549 04/11/20 0700  BP: 129/67 (!) 176/93 (!) 148/75   Pulse: 65 (!) 55 (!) 57   Resp: 17 20 16    Temp: 98.1 F (36.7 C) 98.2 F (36.8 C) 98.2 F (36.8 C)   TempSrc: Oral  Oral   SpO2: 100% 97% 98%   Weight:    91.6 kg  Height:    5\' 5"  (1.651 m)   CBC Latest Ref Rng & Units 03/30/2020 03/05/2020 02/06/2020  WBC 4.0 - 10.5 K/uL 6.8 6.4 7.7  Hemoglobin 12.0 - 15.0 g/dL 12.1 11.4 9.8(L)  Hematocrit 36.0 - 46.0 % 40.1 37.2 31.8(L)  Platelets 150 - 400 K/uL 251 247 317   BMP Latest Ref Rng & Units 03/30/2020 03/05/2020 02/06/2020  Glucose 70 - 99 mg/dL 93 91 83  BUN 8 - 23 mg/dL 28(H) 16 26  Creatinine 0.44 - 1.00 mg/dL 1.68(H) 1.54(H) 1.94(H)  BUN/Creat Ratio 12 - 28 - 10(L) 13  Sodium 135 - 145 mmol/L 139 142 137  Potassium 3.5 - 5.1 mmol/L 4.2 5.0 5.3(H)  Chloride 98 - 111 mmol/L 100 107(H) 100  CO2 22 - 32 mmol/L 29 20 24   Calcium 8.9 - 10.3 mg/dL 9.5 9.6 9.5   Intake/Output      05/18 0701 - 05/19 0700 05/19 0701 - 05/20 0700   P.O. 240    I.V. (mL/kg) 2019.1 (22)    IV Piggyback 200    Total Intake(mL/kg) 2459.1 (26.8)    Urine (mL/kg/hr) 275    Blood 50    Total Output 325    Net +2134.1            Physical Exam: General: NAD.  Upright in bed.  Calm, conversant. Resp: No increased wob Cardio: regular rate  ABD soft Neurologically intact MSK RLE: Neurovascularly intact Sensation intact distally Feet warm.  Intact pulses distally Dorsiflexion/Plantar flexion intact Incision: dressing C/D/I   Assessment: 1 Day Post-Op  S/P Procedure(s) (LRB): TOTAL KNEE ARTHROPLASTY (Right) by Dr. Ernesta Amble. Percell Miller on 04/10/2020  Principal Problem:   Primary osteoarthritis of right knee Active Problems:   HLD  (hyperlipidemia)   Essential hypertension   CHF (congestive heart failure) (HCC)   Chronic combined systolic (congestive) and diastolic (congestive) heart failure (HCC)   Chronic atrial fibrillation   Chronic anticoagulation   History of lower GI bleeding   Primary osteoarthritis, status post right total knee arthroplasty Doing well postop day 1 Tolerating diet and voiding Pain controlled Good mobilization with therapy  Plan: Up with therapy D/C IV fluids Incentive Spirometry Elevate and Apply ice CPM, bone foam  Weight Bearing: Weight Bearing as Tolerated (WBAT) RLE Dressings: Maintain Mepilex.  Please ensure thigh high TED hose are applied to operative leg prior to discharge. VTE prophylaxis: Resume Eliquis, SCDs, ambulation Dispo: Home today after chronic urinary catheter change and therapy evaluation.    Patient's anticipated LOS is less than 2 midnights, meeting these requirements: - Lives within 1 hour of care - Has a competent adult at home to recover with post-op recover - NO history of             - Chronic pain requiring opiods             -  Diabetes             - Heart failure             - Heart attack             - Stroke             - DVT/VTE             - Respiratory Failure/COPD             - Renal failure             - Advanced Liver disease   Patricia Burly III, PA-C 04/11/2020, 7:58 AM

## 2020-04-12 DIAGNOSIS — N184 Chronic kidney disease, stage 4 (severe): Secondary | ICD-10-CM | POA: Diagnosis not present

## 2020-04-12 DIAGNOSIS — Z471 Aftercare following joint replacement surgery: Secondary | ICD-10-CM | POA: Diagnosis not present

## 2020-04-12 DIAGNOSIS — I2510 Atherosclerotic heart disease of native coronary artery without angina pectoris: Secondary | ICD-10-CM | POA: Diagnosis not present

## 2020-04-12 DIAGNOSIS — I428 Other cardiomyopathies: Secondary | ICD-10-CM | POA: Diagnosis not present

## 2020-04-12 DIAGNOSIS — D631 Anemia in chronic kidney disease: Secondary | ICD-10-CM | POA: Diagnosis not present

## 2020-04-12 DIAGNOSIS — I130 Hypertensive heart and chronic kidney disease with heart failure and stage 1 through stage 4 chronic kidney disease, or unspecified chronic kidney disease: Secondary | ICD-10-CM | POA: Diagnosis not present

## 2020-04-12 DIAGNOSIS — I7300 Raynaud's syndrome without gangrene: Secondary | ICD-10-CM | POA: Diagnosis not present

## 2020-04-12 DIAGNOSIS — I5042 Chronic combined systolic (congestive) and diastolic (congestive) heart failure: Secondary | ICD-10-CM | POA: Diagnosis not present

## 2020-04-12 DIAGNOSIS — I482 Chronic atrial fibrillation, unspecified: Secondary | ICD-10-CM | POA: Diagnosis not present

## 2020-04-13 DIAGNOSIS — I428 Other cardiomyopathies: Secondary | ICD-10-CM | POA: Diagnosis not present

## 2020-04-13 DIAGNOSIS — I7300 Raynaud's syndrome without gangrene: Secondary | ICD-10-CM | POA: Diagnosis not present

## 2020-04-13 DIAGNOSIS — Z471 Aftercare following joint replacement surgery: Secondary | ICD-10-CM | POA: Diagnosis not present

## 2020-04-13 DIAGNOSIS — I482 Chronic atrial fibrillation, unspecified: Secondary | ICD-10-CM | POA: Diagnosis not present

## 2020-04-13 DIAGNOSIS — N184 Chronic kidney disease, stage 4 (severe): Secondary | ICD-10-CM | POA: Diagnosis not present

## 2020-04-13 DIAGNOSIS — I5042 Chronic combined systolic (congestive) and diastolic (congestive) heart failure: Secondary | ICD-10-CM | POA: Diagnosis not present

## 2020-04-13 DIAGNOSIS — I2510 Atherosclerotic heart disease of native coronary artery without angina pectoris: Secondary | ICD-10-CM | POA: Diagnosis not present

## 2020-04-13 DIAGNOSIS — I130 Hypertensive heart and chronic kidney disease with heart failure and stage 1 through stage 4 chronic kidney disease, or unspecified chronic kidney disease: Secondary | ICD-10-CM | POA: Diagnosis not present

## 2020-04-13 DIAGNOSIS — D631 Anemia in chronic kidney disease: Secondary | ICD-10-CM | POA: Diagnosis not present

## 2020-04-16 DIAGNOSIS — I5042 Chronic combined systolic (congestive) and diastolic (congestive) heart failure: Secondary | ICD-10-CM | POA: Diagnosis not present

## 2020-04-16 DIAGNOSIS — I2510 Atherosclerotic heart disease of native coronary artery without angina pectoris: Secondary | ICD-10-CM | POA: Diagnosis not present

## 2020-04-16 DIAGNOSIS — N184 Chronic kidney disease, stage 4 (severe): Secondary | ICD-10-CM | POA: Diagnosis not present

## 2020-04-16 DIAGNOSIS — I7300 Raynaud's syndrome without gangrene: Secondary | ICD-10-CM | POA: Diagnosis not present

## 2020-04-16 DIAGNOSIS — I482 Chronic atrial fibrillation, unspecified: Secondary | ICD-10-CM | POA: Diagnosis not present

## 2020-04-16 DIAGNOSIS — Z471 Aftercare following joint replacement surgery: Secondary | ICD-10-CM | POA: Diagnosis not present

## 2020-04-16 DIAGNOSIS — D631 Anemia in chronic kidney disease: Secondary | ICD-10-CM | POA: Diagnosis not present

## 2020-04-16 DIAGNOSIS — I428 Other cardiomyopathies: Secondary | ICD-10-CM | POA: Diagnosis not present

## 2020-04-16 DIAGNOSIS — I130 Hypertensive heart and chronic kidney disease with heart failure and stage 1 through stage 4 chronic kidney disease, or unspecified chronic kidney disease: Secondary | ICD-10-CM | POA: Diagnosis not present

## 2020-04-18 DIAGNOSIS — D631 Anemia in chronic kidney disease: Secondary | ICD-10-CM | POA: Diagnosis not present

## 2020-04-18 DIAGNOSIS — I130 Hypertensive heart and chronic kidney disease with heart failure and stage 1 through stage 4 chronic kidney disease, or unspecified chronic kidney disease: Secondary | ICD-10-CM | POA: Diagnosis not present

## 2020-04-18 DIAGNOSIS — I5042 Chronic combined systolic (congestive) and diastolic (congestive) heart failure: Secondary | ICD-10-CM | POA: Diagnosis not present

## 2020-04-18 DIAGNOSIS — I2510 Atherosclerotic heart disease of native coronary artery without angina pectoris: Secondary | ICD-10-CM | POA: Diagnosis not present

## 2020-04-18 DIAGNOSIS — I482 Chronic atrial fibrillation, unspecified: Secondary | ICD-10-CM | POA: Diagnosis not present

## 2020-04-18 DIAGNOSIS — Z471 Aftercare following joint replacement surgery: Secondary | ICD-10-CM | POA: Diagnosis not present

## 2020-04-18 DIAGNOSIS — I7300 Raynaud's syndrome without gangrene: Secondary | ICD-10-CM | POA: Diagnosis not present

## 2020-04-18 DIAGNOSIS — N184 Chronic kidney disease, stage 4 (severe): Secondary | ICD-10-CM | POA: Diagnosis not present

## 2020-04-18 DIAGNOSIS — I428 Other cardiomyopathies: Secondary | ICD-10-CM | POA: Diagnosis not present

## 2020-04-24 ENCOUNTER — Telehealth: Payer: Self-pay | Admitting: Cardiology

## 2020-04-24 DIAGNOSIS — Z471 Aftercare following joint replacement surgery: Secondary | ICD-10-CM | POA: Diagnosis not present

## 2020-04-24 DIAGNOSIS — I428 Other cardiomyopathies: Secondary | ICD-10-CM | POA: Diagnosis not present

## 2020-04-24 DIAGNOSIS — D631 Anemia in chronic kidney disease: Secondary | ICD-10-CM | POA: Diagnosis not present

## 2020-04-24 DIAGNOSIS — I482 Chronic atrial fibrillation, unspecified: Secondary | ICD-10-CM | POA: Diagnosis not present

## 2020-04-24 DIAGNOSIS — I7300 Raynaud's syndrome without gangrene: Secondary | ICD-10-CM | POA: Diagnosis not present

## 2020-04-24 DIAGNOSIS — I130 Hypertensive heart and chronic kidney disease with heart failure and stage 1 through stage 4 chronic kidney disease, or unspecified chronic kidney disease: Secondary | ICD-10-CM | POA: Diagnosis not present

## 2020-04-24 DIAGNOSIS — I2510 Atherosclerotic heart disease of native coronary artery without angina pectoris: Secondary | ICD-10-CM | POA: Diagnosis not present

## 2020-04-24 DIAGNOSIS — I5042 Chronic combined systolic (congestive) and diastolic (congestive) heart failure: Secondary | ICD-10-CM | POA: Diagnosis not present

## 2020-04-24 DIAGNOSIS — N184 Chronic kidney disease, stage 4 (severe): Secondary | ICD-10-CM | POA: Diagnosis not present

## 2020-04-24 NOTE — Telephone Encounter (Signed)
She reports she is recovery from knee surgery, currently in rehab.  She knows she needs to make a follow up with Dr. Curt Bears and states she will call once she is able to get out, after rehab. Pt calling in to see if she should get Covid shot while taking Eliquis.  She asks if it is ok to get shot while on this medication.  Pt informed it is safe for her to get vaccinated while on blood thinner and she will not need to stop it. Patient verbalized understanding and agreeable to plan.

## 2020-04-24 NOTE — Telephone Encounter (Signed)
Patricia Wagner is calling requesting Patricia Wagner give her a call in regards to her blood thinner. Pt would not go into any further detail in order to use dot phrase. Please advise.

## 2020-04-25 DIAGNOSIS — M25561 Pain in right knee: Secondary | ICD-10-CM | POA: Diagnosis not present

## 2020-04-25 DIAGNOSIS — R262 Difficulty in walking, not elsewhere classified: Secondary | ICD-10-CM | POA: Diagnosis not present

## 2020-04-25 DIAGNOSIS — M6281 Muscle weakness (generalized): Secondary | ICD-10-CM | POA: Diagnosis not present

## 2020-04-25 DIAGNOSIS — M25661 Stiffness of right knee, not elsewhere classified: Secondary | ICD-10-CM | POA: Diagnosis not present

## 2020-04-25 DIAGNOSIS — M1711 Unilateral primary osteoarthritis, right knee: Secondary | ICD-10-CM | POA: Diagnosis not present

## 2020-04-26 DIAGNOSIS — R338 Other retention of urine: Secondary | ICD-10-CM | POA: Diagnosis not present

## 2020-05-02 DIAGNOSIS — M1712 Unilateral primary osteoarthritis, left knee: Secondary | ICD-10-CM | POA: Diagnosis not present

## 2020-05-03 ENCOUNTER — Emergency Department (HOSPITAL_COMMUNITY)
Admission: EM | Admit: 2020-05-03 | Discharge: 2020-05-04 | Disposition: A | Discharge status: Home / Self Care | Payer: Medicare HMO | Attending: Emergency Medicine | Admitting: Emergency Medicine

## 2020-05-03 ENCOUNTER — Ambulatory Visit: Payer: Self-pay

## 2020-05-03 ENCOUNTER — Encounter (HOSPITAL_COMMUNITY): Payer: Self-pay

## 2020-05-03 ENCOUNTER — Encounter (HOSPITAL_COMMUNITY): Payer: Self-pay | Admitting: Emergency Medicine

## 2020-05-03 ENCOUNTER — Ambulatory Visit (HOSPITAL_COMMUNITY)
Admission: EM | Admit: 2020-05-03 | Discharge: 2020-05-03 | Disposition: A | Discharge status: Home / Self Care | Payer: Medicare HMO | Source: Home / Self Care

## 2020-05-03 ENCOUNTER — Other Ambulatory Visit: Payer: Self-pay

## 2020-05-03 DIAGNOSIS — R197 Diarrhea, unspecified: Secondary | ICD-10-CM | POA: Diagnosis not present

## 2020-05-03 DIAGNOSIS — K921 Melena: Secondary | ICD-10-CM | POA: Insufficient documentation

## 2020-05-03 DIAGNOSIS — Z8673 Personal history of transient ischemic attack (TIA), and cerebral infarction without residual deficits: Secondary | ICD-10-CM | POA: Insufficient documentation

## 2020-05-03 DIAGNOSIS — N179 Acute kidney failure, unspecified: Secondary | ICD-10-CM

## 2020-05-03 DIAGNOSIS — Z96651 Presence of right artificial knee joint: Secondary | ICD-10-CM | POA: Diagnosis not present

## 2020-05-03 DIAGNOSIS — I480 Paroxysmal atrial fibrillation: Secondary | ICD-10-CM | POA: Insufficient documentation

## 2020-05-03 DIAGNOSIS — N183 Chronic kidney disease, stage 3 unspecified: Secondary | ICD-10-CM | POA: Diagnosis not present

## 2020-05-03 DIAGNOSIS — Z87891 Personal history of nicotine dependence: Secondary | ICD-10-CM | POA: Diagnosis not present

## 2020-05-03 DIAGNOSIS — K529 Noninfective gastroenteritis and colitis, unspecified: Secondary | ICD-10-CM

## 2020-05-03 DIAGNOSIS — I5022 Chronic systolic (congestive) heart failure: Secondary | ICD-10-CM | POA: Diagnosis not present

## 2020-05-03 DIAGNOSIS — I130 Hypertensive heart and chronic kidney disease with heart failure and stage 1 through stage 4 chronic kidney disease, or unspecified chronic kidney disease: Secondary | ICD-10-CM | POA: Insufficient documentation

## 2020-05-03 DIAGNOSIS — Z7901 Long term (current) use of anticoagulants: Secondary | ICD-10-CM | POA: Diagnosis not present

## 2020-05-03 DIAGNOSIS — I2510 Atherosclerotic heart disease of native coronary artery without angina pectoris: Secondary | ICD-10-CM | POA: Diagnosis not present

## 2020-05-03 LAB — CBC
HCT: 38.2 % (ref 36.0–46.0)
Hemoglobin: 11.4 g/dL — ABNORMAL LOW (ref 12.0–15.0)
MCH: 28 pg (ref 26.0–34.0)
MCHC: 29.8 g/dL — ABNORMAL LOW (ref 30.0–36.0)
MCV: 93.9 fL (ref 80.0–100.0)
Platelets: UNDETERMINED 10*3/uL (ref 150–400)
RBC: 4.07 MIL/uL (ref 3.87–5.11)
RDW: 18.9 % — ABNORMAL HIGH (ref 11.5–15.5)
WBC: 7.2 10*3/uL (ref 4.0–10.5)
nRBC: 0 % (ref 0.0–0.2)

## 2020-05-03 LAB — COMPREHENSIVE METABOLIC PANEL
ALT: 11 U/L (ref 0–44)
AST: 16 U/L (ref 15–41)
Albumin: 2.8 g/dL — ABNORMAL LOW (ref 3.5–5.0)
Alkaline Phosphatase: 83 U/L (ref 38–126)
Anion gap: 14 (ref 5–15)
BUN: 43 mg/dL — ABNORMAL HIGH (ref 8–23)
CO2: 22 mmol/L (ref 22–32)
Calcium: 9.1 mg/dL (ref 8.9–10.3)
Chloride: 106 mmol/L (ref 98–111)
Creatinine, Ser: 2.35 mg/dL — ABNORMAL HIGH (ref 0.44–1.00)
GFR calc Af Amer: 24 mL/min — ABNORMAL LOW (ref >60)
GFR calc non Af Amer: 21 mL/min — ABNORMAL LOW (ref >60)
Glucose, Bld: 112 mg/dL — ABNORMAL HIGH (ref 70–99)
Potassium: 4.5 mmol/L (ref 3.5–5.1)
Sodium: 142 mmol/L (ref 135–145)
Total Bilirubin: 0.7 mg/dL (ref 0.3–1.2)
Total Protein: 7.2 g/dL (ref 6.5–8.1)

## 2020-05-03 MED ORDER — SODIUM CHLORIDE 0.9 % IV BOLUS
1000.0000 mL | Freq: Once | INTRAVENOUS | Status: AC
  Administered 2020-05-03: 1000 mL via INTRAVENOUS

## 2020-05-03 MED ORDER — ONDANSETRON 4 MG PO TBDP
4.0000 mg | ORAL_TABLET | Freq: Three times a day (TID) | ORAL | 0 refills | Status: DC | PRN
Start: 2020-05-03 — End: 2020-07-09

## 2020-05-03 NOTE — Telephone Encounter (Signed)
Called pt, told to follow up with pcp regarding rectal bleeding

## 2020-05-03 NOTE — ED Provider Notes (Signed)
[place]    CSN: [id number] Arrival date & time: 05/03/20  1438      History   Chief Complaint Chief Complaint  Patient presents with  . Diarrhea    HPI Patricia Wagner is a 69 y.o. female.   Patient with history of atrial fibrillation on Eliquis, CKD, CHF, diverticulosis presents for evaluation of diarrhea and blood in stool.  She reports since Monday or Tuesday of this week she has had several episodes of diarrhea daily.  She reports" anytime I eat or drink anything it comes out".  She reports completely loose stools.  She reports she is not entirely sure how often she is using the bathroom but it is a lot.  At times she says 3-4 however other points she says more than this.  She reports she has had some issues with incontinence over the last few days.  She denies any belly pain, fevers or chills.  She was concerned this morning she continued to have loose stools but noticed a" blood clot" in her undergarments when going to use the restroom.  She had not noticed this previously.  She denies any nausea or vomiting.  Denies lightheaded feeling.  She reports she has been trying to drink but is not eating much at all.  She is concerned about eating and drinking as it will make her have to use the restroom.  Patient states that she lives alone and she has had a recent knee surgery which limits her mobility and ability to drive.  She does not have a ride and a friend brought her here today.  Important to note March 2021 patient was admitted for bleeding diverticulosis with severe blood loss anemia requiring transfusion.  Patient is currently on Eliquis therapy for her atrial fibrillation     Past Medical History:  Diagnosis Date  . Acute blood loss anemia 03/05/2018  . Acute hypercapnic respiratory failure ([place]) 04/30/2017  . Acute renal failure superimposed on chronic kidney disease ([place]) 10/20/2016  . Arthritis    hands, knees  . Atrial fibrillation ([place])    a. s/p multiple  cardioversions; failed tikosyn/sotalol.  . Bradycardia 11/20/2016  . CAD in native artery, s/p cardiac cath with non obstructive CAD 10/24/2016  . Cerebrovascular accident (CVA) due to embolism of left cerebellar artery ([place])   . CHF (congestive heart failure) ([place])   . Chronic anticoagulation 03/15/2018  . Chronic atrial fibrillation ([place]) 03/05/2018  . Chronic systolic (congestive) heart failure ([place]) 03/05/2018  . CKD (chronic kidney disease) stage 3, GFR 30-59 ml/min   . Coagulopathy ([place]) 03/05/2018  . DIVERTICULITIS, HX OF 07/25/2007  . Diverticulosis of colon with hemorrhage 07/22/2007   Qualifier: Diagnosis of  By: [name]    . DIVERTICULOSIS, COLON 07/22/2007  . Dysuria 07/21/2017  . Edema, peripheral    a. chronic BLE edema, R>L. Prior trauma from dog attack and accident.  . Essential hypertension 07/22/2007   Qualifier: Diagnosis of  By: [name]    . History of kidney stones   . HLD (hyperlipidemia) 02/03/2008   Qualifier: Diagnosis of  By: [name], [name]   . HYPERLIPIDEMIA 02/03/2008  . Hypersomnia    declines w/u  . Hypertension   . Hypotension (arterial) 04/30/2017  . MENOPAUSAL DISORDER 01/09/2011  . Morbid obesity ([place]) 07/22/2007  . NICM (nonischemic cardiomyopathy) ([place]) 10/24/2016  . PAF (paroxysmal atrial fibrillation) ([place])   . Presence of indwelling urinary catheter    supra pubic cath  .  Raynaud's syndrome 07/22/2007  . Stroke ([place]) 2017  . Suspected sleep apnea 05/20/2017  . THYROID NODULE, RIGHT 01/04/2010  . VITAMIN D DEFICIENCY 01/09/2011   Qualifier: Diagnosis of  By: [name], [name]     Patient Active Problem List   Diagnosis Date Noted  . Primary osteoarthritis of right knee 03/20/2020  . History of lower GI bleeding 03/20/2020  . Acute lower GI bleeding 01/18/2020  . Acquired thrombophilia ([place]) 11/14/2019  . Persistent atrial fibrillation ([place]) 10/13/2019  . Allergic rhinitis 09/10/2019  . Left ear pain 09/10/2019  . Vitamin D  deficiency 04/15/2019  . Cardiogenic shock ([place])   . Acute on chronic respiratory failure with hypoxia ([place])   . Elevated troponin 08/05/2018  . Hyperbilirubinemia 08/05/2018  . Abnormal transaminases 08/05/2018  . Dysarthria 08/05/2018  . Lower GI bleed 03/15/2018  . Chronic anticoagulation 03/15/2018  . Rectal bleed   . Acute renal failure superimposed on stage 4 chronic kidney disease ([place]) 03/05/2018  . Coagulopathy ([place]) 03/05/2018  . Chronic systolic (congestive) heart failure ([place]) 03/05/2018  . Chronic atrial fibrillation 03/05/2018  . Acute blood loss anemia 03/05/2018  . Diverticulosis 03/05/2018  . Heme positive stool 03/05/2018  . Urine abnormality 02/02/2018  . Cough 02/02/2018  . Bacteremia due to Gram-negative bacteria 12/30/2017  . Increased anion gap metabolic acidosis 16/08/9603  . Metabolic alkalosis 54/07/8118  . Acute UTI 12/29/2017  . Obstructive uropathy   . Anemia 12/17/2017  . AKI (acute kidney injury) ([place]) 11/30/2017  . Diarrhea 11/30/2017  . Urinary retention 11/23/2017  . Lower abdominal pain 11/23/2017  . Dysuria 07/21/2017  . Suspected sleep apnea 05/20/2017  . Pressure injury of skin 05/02/2017  . Acute respiratory failure with hypoxia ([place]) 04/30/2017  . Hypotension (arterial) 04/30/2017  . Bradycardia 11/20/2016  . Chronic combined systolic (congestive) and diastolic (congestive) heart failure ([place]) 11/20/2016  . Cerebrovascular accident (CVA) due to embolism of left cerebellar artery ([place])   . PAF (paroxysmal atrial fibrillation) ([place])   . CHF (congestive heart failure) ([place]) 10/31/2016  . CAD in native artery, s/p cardiac cath with non obstructive CAD 10/24/2016  . NICM (nonischemic cardiomyopathy) ([place]) 10/24/2016  . Tobacco abuse 10/20/2016  . Acute renal failure superimposed on chronic kidney disease ([place]) 10/20/2016  . Hematochezia 06/29/2013  . Degenerative arthritis of left knee 03/15/2012  . Encounter for well adult exam with  abnormal findings 03/12/2012  . Hypersomnia   . MENOPAUSAL DISORDER 01/09/2011  . THYROID NODULE, RIGHT 01/04/2010  . HLD (hyperlipidemia) 02/03/2008  . LEG CRAMPS, NOCTURNAL 02/03/2008  . LOW BACK PAIN 07/25/2007  . Morbid obesity ([place]) 07/22/2007  . Essential hypertension 07/22/2007  . Raynaud's syndrome 07/22/2007  . Diverticulosis of colon with hemorrhage 07/22/2007  . SYMPTOM, EDEMA 07/22/2007    Past Surgical History:  Procedure Laterality Date  . ATRIAL FIBRILLATION ABLATION N/A 10/13/2019   Procedure: ATRIAL FIBRILLATION ABLATION;  Surgeon: [name], MD;  Location: [place] [facility] LAB;  Service: Cardiovascular;  Laterality: N/A;  . CARDIAC CATHETERIZATION N/A 10/23/2016   Procedure: Left Heart Cath and Coronary Angiography;  Surgeon: [name], MD;  Location: [facility] CV LAB;  Service: Cardiovascular;  Laterality: N/A;  . CARDIOVERSION N/A 10/31/2016   Procedure: CARDIOVERSION;  Surgeon: [name], MD;  Location: [facility] ENDOSCOPY;  Service: Cardiovascular;  Laterality: N/A;  . CARDIOVERSION N/A 11/03/2016   Procedure: CARDIOVERSION;  Surgeon: [name], MD;  Location: [place];  Service: Cardiovascular;  Laterality: N/A;  . CARDIOVERSION  N/A 11/18/2016   Procedure: CARDIOVERSION;  Surgeon: [name], MD;  Location: [place];  Service: Cardiovascular;  Laterality: N/A;  . CARDIOVERSION N/A 03/25/2017   Procedure: CARDIOVERSION;  Surgeon: [name], MD;  Location: [facility] ENDOSCOPY;  Service: Cardiovascular;  Laterality: N/A;  . CARDIOVERSION N/A 05/06/2017   Procedure: CARDIOVERSION;  Surgeon: [name], MD;  Location: [facility] ENDOSCOPY;  Service: Cardiovascular;  Laterality: N/A;  . CARDIOVERSION N/A 05/12/2017   Procedure: CARDIOVERSION;  Surgeon: [name], MD;  Location: [facility] ENDOSCOPY;  Service: Cardiovascular;  Laterality: N/A;  . COLONOSCOPY N/A 12/04/2017   Procedure: COLONOSCOPY;  Surgeon: [name], MD;  Location:  [facility] ENDOSCOPY;  Service: Endoscopy;  Laterality: N/A;  . COLONOSCOPY    . COLONOSCOPY W/ POLYPECTOMY  02/2011   pan diverticulosis.  tubular adenoma without dysplasia on 5 mm sigmoid polyp.  Dr Fuller Plan.    Marland Kitchen FLEXIBLE SIGMOIDOSCOPY N/A 01/18/2020   Procedure: FLEXIBLE SIGMOIDOSCOPY;  Surgeon: [name], DO;  Location: [place];  Service: Gastroenterology;  Laterality: N/A;  . HEMOSTASIS CLIP PLACEMENT  01/18/2020   Procedure: HEMOSTASIS CLIP PLACEMENT;  Surgeon: [name], DO;  Location: [facility] ENDOSCOPY;  Service: Gastroenterology;;  . PARTIAL HYSTERECTOMY     1 OVARY LEFT  . SCLEROTHERAPY  01/18/2020   Procedure: SCLEROTHERAPY;  Surgeon: [name], DO;  Location: [place] ENDOSCOPY;  Service: Gastroenterology;;  . TEE WITHOUT CARDIOVERSION N/A 10/31/2016   Procedure: TRANSESOPHAGEAL ECHOCARDIOGRAM (TEE);  Surgeon: [name], MD;  Location: [facility] ENDOSCOPY;  Service: Cardiovascular;  Laterality: N/A;  . TEE WITHOUT CARDIOVERSION N/A 03/25/2017   Procedure: TRANSESOPHAGEAL ECHOCARDIOGRAM (TEE);  Surgeon: [name], MD;  Location: [facility] ENDOSCOPY;  Service: Cardiovascular;  Laterality: N/A;  . TOTAL KNEE ARTHROPLASTY Right 04/10/2020   Procedure: TOTAL KNEE ARTHROPLASTY;  Surgeon: Renette Butters, MD;  Location: [facility] ORS;  Service: Orthopedics;  Laterality: Right;    OB History   No obstetric history on file.      Home Medications    Prior to Admission medications   Medication Sig Start Date End Date Taking? Authorizing Provider  apixaban (ELIQUIS) 5 MG TABS tablet Take 1 tablet (5 mg total) by mouth 2 (two) times daily. 03/23/20   Camnitz, Ocie Doyne, MD  baclofen (LIORESAL) 10 MG tablet Take 1 tablet (10 mg total) by mouth 3 (three) times daily as needed for muscle spasms. 04/10/20   Martensen, Charna [name] III, PA-C  Cholecalciferol (VITAMIN D3) 25 MCG (1000 UT) CAPS Take 1,000 Units by mouth daily.    [provider]  furosemide (LASIX) 80 MG tablet TAKE 1  TABLET BY MOUTH EVERY DAY Patient taking differently: Take 80 mg by mouth daily.  11/09/19   Bensimhon, Shaune Pascal, MD  gabapentin (NEURONTIN) 100 MG capsule Take 1 capsule (100 mg total) by mouth 2 (two) times daily for 14 days. For pain. 04/10/20 04/24/20  Prudencio Burly III, PA-C  isosorbide mononitrate (IMDUR) 30 MG 24 hr tablet TAKE HALF A TABLET BY MOUTH EVERY DAY Patient taking differently: Take 15 mg by mouth daily.  09/07/19   Biagio Borg, MD  ondansetron (ZOFRAN) 4 MG tablet Take 1 tablet (4 mg total) by mouth every 8 (eight) hours as needed for nausea or vomiting. 04/10/20   Prudencio Burly III, PA-C  pantoprazole (PROTONIX) 40 MG tablet TAKE 1 TABLET BY MOUTH EVERY DAY 03/23/20   Camnitz, Ocie Doyne, MD  potassium chloride SA (KLOR-CON M20) 20 MEQ tablet Take  2 tablets (40 mEq total) by mouth daily. Needs appt for further refills 04/06/20   Larey Dresser, MD  ranolazine (RANEXA) 500 MG 12 hr tablet TAKE 1 TABLET BY MOUTH TWICE A DAY Patient taking differently: Take 500 mg by mouth 2 (two) times daily.  12/21/19   Larey Dresser, MD  rosuvastatin (CRESTOR) 10 MG tablet Take 1 tablet (10 mg total) by mouth daily. 09/07/19   Biagio Borg, MD  VOLTAREN 1 % GEL Apply 1 application topically 2 (two) times daily as needed (Knee pain).  09/14/19   [provider]    Family History Family History  Problem Relation Age of Onset  . Asthma Mother   . Diabetes Father   . Heart disease Father        Died of presumed heart attack - 45s  . Lung disease Sister   . Heart disease Sister        Twin sister has heart issue, unclear what kind  . Thyroid disease Neg Hx   . Colon polyps Neg Hx   . Esophageal cancer Neg Hx   . Pancreatic cancer Neg Hx   . Stomach cancer Neg Hx     Social History Social History   Tobacco Use  . Smoking status: Former Smoker    Packs/day: 0.50    Types: Cigarettes    Start date: 2019  . Smokeless tobacco: Never Used  Vaping Use  .  Vaping Use: Never used  Substance Use Topics  . Alcohol use: Not Currently    Alcohol/week: 4.0 standard drinks    Types: 4 Cans of beer per week    Comment: Intermittent  . Drug use: No     Allergies   Ace inhibitors   Review of Systems Review of Systems   Physical Exam Triage Vital Signs ED Triage Vitals [05/03/20 1529]  Enc Vitals Group     BP 114/62     Pulse Rate 83     Resp 16     Temp 98.8 F (37.1 C)     Temp Source Oral     SpO2 100 %     Weight 220 lb (99.8 kg)     Height 5\' 5"  (1.651 m)     Head Circumference      Peak Flow      Pain Score 0     Pain Loc      Pain Edu?      Excl. in [place]?    No data found.  Updated Vital Signs BP 114/62   Pulse 83   Temp 98.8 F (37.1 C) (Oral)   Resp 16   Ht 5\' 5"  (1.651 m)   Wt 220 lb (99.8 kg)   SpO2 100%   BMI 36.61 kg/m   Visual Acuity Right Eye Distance:   Left Eye Distance:   Bilateral Distance:    Right Eye Near:   Left Eye Near:    Bilateral Near:     Physical Exam Vitals and nursing note reviewed.  Constitutional:      General: She is not in acute distress.    Appearance: She is well-developed. She is not ill-appearing.  HENT:     Head: Normocephalic and atraumatic.     Mouth/Throat:     Comments: Tongue dry buccal membranes moist Eyes:     Extraocular Movements: Extraocular movements intact.     Conjunctiva/sclera: Conjunctivae normal.     Pupils: Pupils are equal, round, and reactive to light.  Cardiovascular:     Rate and Rhythm: Normal rate and regular rhythm.     Heart sounds: No murmur heard.   Pulmonary:     Effort: Pulmonary effort is normal. No respiratory distress.     Breath sounds: Normal breath sounds.  Abdominal:     Palpations: Abdomen is soft.     Tenderness: There is no abdominal tenderness. There is no right CVA tenderness or left CVA tenderness.  Musculoskeletal:     Cervical back: Neck supple.     Right lower leg: No edema.     Left lower leg: No edema.    Skin:    General: Skin is warm and dry.  Neurological:     Mental Status: She is alert.      UC Treatments / Results  Labs (all labs ordered are listed, but only abnormal results are displayed) Labs Reviewed - No data to display  EKG   Radiology No results found.  Procedures Procedures (including critical care time)  Medications Ordered in UC Medications - No data to display  Initial Impression / Assessment and Plan / UC Course  I have reviewed the triage vital signs and the nursing notes.  Pertinent labs & imaging results that were available during my care of the patient were reviewed by me and considered in my medical decision making (see chart for details).     #Severe diarrhea #Bloody diarrhea Patient is a 69 year old numerous comorbidities most notably atrial fibrillation on anticoagulation and recent bleeding diverticulosis who presents with severe diarrhea and bloody stool.  Discussed with patient given her recent history of severe bleeding diverticulosis and she continues to be on Eliquis therapy I have concerns about continued bleeding in her GI system.  I also have concerns about her hydration status and her ability to maintain.  We discussed initially brief work-up here in urgent care however given she does not have anyone at home and limited mobility there would be no way to report to the emergency department pending work-up from the urgent care if discharged home.  I felt that given the social situation and her numerous comorbidities with the presenting concerns that she would be best evaluated today in the emergency department for expedited follow-up on her lab results and to ensure safety for discharge home.  Patient does agree that she will report to the [facility] emergency department via the friend who brought her here today. Final Clinical Impressions(s) / UC Diagnoses   Final diagnoses:  Severe diarrhea  Bloody diarrhea     Discharge Instructions      You need to report to the [facility] emergency department for further evaluation    ED Prescriptions    None     PDMP not reviewed this encounter.   Purnell Shoemaker, PA-C 05/03/20 1631

## 2020-05-03 NOTE — Discharge Instructions (Signed)
You need to report to the [facility] emergency department for further evaluation

## 2020-05-03 NOTE — ED Notes (Signed)
Patient is being discharged from the Urgent [facility] and sent to the Emergency Department via wheelchair by staff. Per Roland Rack, PA, patient is stable but in need of higher level of care due to diarrhea. Patient is aware and verbalizes understanding of plan of care.  Vitals:   05/03/20 1529  BP: 114/62  Pulse: 83  Resp: 16  Temp: 98.8 F (37.1 C)  SpO2: 100%

## 2020-05-03 NOTE — ED Provider Notes (Signed)
[facility] Emergency Department Provider Note MRN:  [id number]  Arrival date & time: 05/03/20     Chief Complaint   Diarrhea   History of Present Illness   Patricia Wagner is a 69 y.o. year-old female with a history of A. fib, CKD presenting to the ED with chief complaint of diarrhea.  2 days of watery diarrhea.  Nausea as well, no vomiting, no abdominal pain, no fever.  She thinks she is having trouble replacing her fluid losses.  She noted that she had 1 drop of blood today with the stool.  She was sent here by PCPs office.  Denies chest pain or shortness of breath, no lightheadedness, no other complaints.  No recent antibiotics, no recent travel.  Review of Systems  A complete 10 system review of systems was obtained and all systems are negative except as noted in the HPI and PMH.   Patient's Health History    Past Medical History:  Diagnosis Date  . Acute blood loss anemia 03/05/2018  . Acute hypercapnic respiratory failure ([place]) 04/30/2017  . Acute renal failure superimposed on chronic kidney disease ([place]) 10/20/2016  . Arthritis    hands, knees  . Atrial fibrillation ([place])    a. s/p multiple cardioversions; failed tikosyn/sotalol.  . Bradycardia 11/20/2016  . CAD in native artery, s/p cardiac cath with non obstructive CAD 10/24/2016  . Cerebrovascular accident (CVA) due to embolism of left cerebellar artery ([place])   . CHF (congestive heart failure) ([place])   . Chronic anticoagulation 03/15/2018  . Chronic atrial fibrillation ([place]) 03/05/2018  . Chronic systolic (congestive) heart failure ([place]) 03/05/2018  . CKD (chronic kidney disease) stage 3, GFR 30-59 ml/min   . Coagulopathy ([place]) 03/05/2018  . DIVERTICULITIS, HX OF 07/25/2007  . Diverticulosis of colon with hemorrhage 07/22/2007   Qualifier: Diagnosis of  By: [name]    . DIVERTICULOSIS, COLON 07/22/2007  . Dysuria 07/21/2017  . Edema, peripheral    a. chronic BLE edema, R>L. Prior trauma from  dog attack and accident.  . Essential hypertension 07/22/2007   Qualifier: Diagnosis of  By: [name]    . History of kidney stones   . HLD (hyperlipidemia) 02/03/2008   Qualifier: Diagnosis of  By: [name], [name]   . HYPERLIPIDEMIA 02/03/2008  . Hypersomnia    declines w/u  . Hypertension   . Hypotension (arterial) 04/30/2017  . MENOPAUSAL DISORDER 01/09/2011  . Morbid obesity ([place]) 07/22/2007  . NICM (nonischemic cardiomyopathy) ([place]) 10/24/2016  . PAF (paroxysmal atrial fibrillation) ([place])   . Presence of indwelling urinary catheter    supra pubic cath  . Raynaud's syndrome 07/22/2007  . Stroke ([place]) 2017  . Suspected sleep apnea 05/20/2017  . THYROID NODULE, RIGHT 01/04/2010  . VITAMIN D DEFICIENCY 01/09/2011   Qualifier: Diagnosis of  By: [name], [name]     Past Surgical History:  Procedure Laterality Date  . ATRIAL FIBRILLATION ABLATION N/A 10/13/2019   Procedure: ATRIAL FIBRILLATION ABLATION;  Surgeon: [name], MD;  Location: [place] [facility];  Service: Cardiovascular;  Laterality: N/A;  . CARDIAC CATHETERIZATION N/A 10/23/2016   Procedure: Left Heart Cath and Coronary Angiography;  Surgeon: [name], MD;  Location: [place] [facility];  Service: Cardiovascular;  Laterality: N/A;  . CARDIOVERSION N/A 10/31/2016   Procedure: CARDIOVERSION;  Surgeon: [name], MD;  Location: [facility] ENDOSCOPY;  Service: Cardiovascular;  Laterality: N/A;  . CARDIOVERSION N/A 11/03/2016   Procedure: CARDIOVERSION;  Surgeon:  [name], MD;  Location: [place];  Service: Cardiovascular;  Laterality: N/A;  . CARDIOVERSION N/A 11/18/2016   Procedure: CARDIOVERSION;  Surgeon: [name], MD;  Location: [place];  Service: Cardiovascular;  Laterality: N/A;  . CARDIOVERSION N/A 03/25/2017   Procedure: CARDIOVERSION;  Surgeon: [name], MD;  Location: [facility] ENDOSCOPY;  Service: Cardiovascular;  Laterality: N/A;  . CARDIOVERSION N/A 05/06/2017   Procedure:  CARDIOVERSION;  Surgeon: [name], MD;  Location: [facility] ENDOSCOPY;  Service: Cardiovascular;  Laterality: N/A;  . CARDIOVERSION N/A 05/12/2017   Procedure: CARDIOVERSION;  Surgeon: [name], MD;  Location: [facility] ENDOSCOPY;  Service: Cardiovascular;  Laterality: N/A;  . COLONOSCOPY N/A 12/04/2017   Procedure: COLONOSCOPY;  Surgeon: [name], MD;  Location: [facility] ENDOSCOPY;  Service: Endoscopy;  Laterality: N/A;  . COLONOSCOPY    . COLONOSCOPY W/ POLYPECTOMY  02/2011   pan diverticulosis.  tubular adenoma without dysplasia on 5 mm sigmoid polyp.  Dr Fuller Plan.    Marland Kitchen FLEXIBLE SIGMOIDOSCOPY N/A 01/18/2020   Procedure: FLEXIBLE SIGMOIDOSCOPY;  Surgeon: [name], DO;  Location: [place];  Service: Gastroenterology;  Laterality: N/A;  . HEMOSTASIS CLIP PLACEMENT  01/18/2020   Procedure: HEMOSTASIS CLIP PLACEMENT;  Surgeon: [name], DO;  Location: [facility] ENDOSCOPY;  Service: Gastroenterology;;  . PARTIAL HYSTERECTOMY     1 OVARY LEFT  . SCLEROTHERAPY  01/18/2020   Procedure: SCLEROTHERAPY;  Surgeon: [name], DO;  Location: [place] ENDOSCOPY;  Service: Gastroenterology;;  . TEE WITHOUT CARDIOVERSION N/A 10/31/2016   Procedure: TRANSESOPHAGEAL ECHOCARDIOGRAM (TEE);  Surgeon: [name], MD;  Location: [facility] ENDOSCOPY;  Service: Cardiovascular;  Laterality: N/A;  . TEE WITHOUT CARDIOVERSION N/A 03/25/2017   Procedure: TRANSESOPHAGEAL ECHOCARDIOGRAM (TEE);  Surgeon: [name], MD;  Location: [facility] ENDOSCOPY;  Service: Cardiovascular;  Laterality: N/A;  . TOTAL KNEE ARTHROPLASTY Right 04/10/2020   Procedure: TOTAL KNEE ARTHROPLASTY;  Surgeon: Renette Butters, MD;  Location: [facility] ORS;  Service: Orthopedics;  Laterality: Right;    Family History  Problem Relation Age of Onset  . Asthma Mother   . Diabetes Father   . Heart disease Father        Died of presumed heart attack - 66s  . Lung disease Sister   . Heart disease Sister        Twin sister has heart issue,  unclear what kind  . Thyroid disease Neg Hx   . Colon polyps Neg Hx   . Esophageal cancer Neg Hx   . Pancreatic cancer Neg Hx   . Stomach cancer Neg Hx     Social History   Socioeconomic History  . Marital status: Single    Spouse name: Not on file  . Number of children: 1  . Years of education: Not on file  . Highest education level: Not on file  Occupational History  . Occupation: Academic librarian   Tobacco Use  . Smoking status: Former Smoker    Packs/day: 0.50    Types: Cigarettes    Start date: 2019  . Smokeless tobacco: Never Used  Vaping Use  . Vaping Use: Never used  Substance and Sexual Activity  . Alcohol use: Not Currently    Alcohol/week: 4.0 standard drinks    Types: 4 Cans of beer per week    Comment: Intermittent  . Drug use: No  . Sexual activity: Not on file  Other Topics Concern  . Not on file  Social History Narrative  . Not on  file   Social Determinants of Health   Financial Resource Strain:   . Difficulty of Paying Living Expenses:   Food Insecurity:   . Worried About Charity fundraiser in the Last Year:   . Arboriculturist in the Last Year:   Transportation Needs:   . Film/video editor (Medical):   Marland Kitchen Lack of Transportation (Non-Medical):   Physical Activity:   . Days of Exercise per Week:   . Minutes of Exercise per Session:   Stress:   . Feeling of Stress :   Social Connections:   . Frequency of Communication with Friends and Family:   . Frequency of Social Gatherings with Friends and Family:   . Attends Religious Services:   . Active Member of Clubs or Organizations:   . Attends Archivist Meetings:   Marland Kitchen Marital Status:   Intimate Partner Violence:   . Fear of Current or Ex-Partner:   . Emotionally Abused:   Marland Kitchen Physically Abused:   . Sexually Abused:      Physical Exam   Vitals:   05/03/20 2304 05/03/20 2305  BP:    Pulse: 66 65  Resp:    Temp:    SpO2: 97% 97%    CONSTITUTIONAL: Well-appearing, NAD NEURO:   Alert and oriented x 3, no focal deficits EYES:  eyes equal and reactive ENT/NECK:  no LAD, no JVD CARDIO: Regular rate, well-perfused, normal S1 and S2 PULM:  CTAB no wheezing or rhonchi GI/GU:  normal bowel sounds, non-distended, non-tender MSK/SPINE:  No gross deformities, no edema SKIN:  no rash, atraumatic PSYCH:  Appropriate speech and behavior  *Additional and/or pertinent findings included in MDM below  Diagnostic and Interventional Summary    EKG Interpretation  Date/Time:    Ventricular Rate:    PR Interval:    QRS Duration:   QT Interval:    QTC Calculation:   R Axis:     Text Interpretation:        Labs Reviewed  COMPREHENSIVE METABOLIC PANEL - Abnormal; Notable for the following components:      Result Value   Glucose, Bld 112 (*)    BUN 43 (*)    Creatinine, Ser 2.35 (*)    Albumin 2.8 (*)    GFR calc non Af Amer 21 (*)    GFR calc Af Amer 24 (*)    All other components within normal limits  CBC - Abnormal; Notable for the following components:   Hemoglobin 11.4 (*)    MCHC 29.8 (*)    RDW 18.9 (*)    All other components within normal limits  POC OCCULT BLOOD, ED  TYPE AND SCREEN    No orders to display    Medications  sodium chloride 0.9 % bolus 1,000 mL (1,000 mLs Intravenous New Bag/Given 05/03/20 2207)     Procedures  /  Critical Care Procedures  ED Course and Medical Decision Making  I have reviewed the triage vital signs, the nursing notes, and pertinent available records from the EMR.  Listed above are laboratory and imaging tests that I personally ordered, reviewed, and interpreted and then considered in my medical decision making (see below for details).      History of A. fib on anticoagulation, history of diverticular bleed here with 2 days of diarrhea, she endorses a very small amount of blood during 1 bowel movement today.  She has AKI on her laboratory evaluation.  She is refusing admission.  She has  full capacity to make her  own decisions.  She is adamant on going home.  Overall I suspect a viral etiology of patient's diarrhea, she has a completely benign abdomen, she is hemodynamically stable, she waited 5 hours in the emergency department waiting room with no worsening of condition.  She agrees to drink plenty of fluids at home and come back if her bleeding worsens.  H&H largely unchanged from prior.  Discharge with strict return precautions.    Barth Kirks. Sedonia Small, MD [place] [email or web address]  Final Clinical Impressions(s) / ED Diagnoses     ICD-10-CM   1. Diarrhea, unspecified type  R19.7   2. AKI (acute kidney injury) ([place])  N17.9     ED Discharge Orders         Ordered    ondansetron (ZOFRAN ODT) 4 MG disintegrating tablet  Every 8 hours PRN     Discontinue  Reprint     05/03/20 2308           Discharge Instructions Discussed with and Provided to Patient:     Discharge Instructions     You were evaluated in the Emergency Department and after careful evaluation, we did not find any emergent condition requiring admission or further testing in the hospital.  Your exam/testing today was overall reassuring.  As discussed, it is very important that you return to the emergency department with any worsening of bleeding.  We also encourage you to have your kidney function rechecked by your primary care doctor within the next 3 to 5 days.  Drink plenty of fluids at home to keep up with your diarrhea loss of fluids.  Please return to the Emergency Department if you experience any worsening of your condition.  We encourage you to follow up with a primary care provider.  Thank you for allowing [place] to be a part of your care.       Maudie Flakes, MD 05/03/20 2312

## 2020-05-03 NOTE — ED Triage Notes (Signed)
Pt c/o diarrheax2 days. PT states had 3 bouts of diarrhea and had a blood clot once in the stool today. Pt denies any other symptoms.

## 2020-05-03 NOTE — Telephone Encounter (Signed)
°  Patient called stating that she has had runny stools to many to count in the last 24 hours.  She states that today she passed a blood clot. This was the only time she has seen a clot or blood. She denies abdominal pain.  She states that she has had recent Knee surgery and is taking eliquist 3 times per day. Per chart she should only be taking 2 times per day. She denies other symptoms. Call placed to office. No appointments are available. Patient is refusing the ER but agrees to seek care at [facility] in her area.  Reason for Disposition  Taking Coumadin (warfarin) or other strong blood thinner, or known bleeding disorder (e.g., thrombocytopenia)  Answer Assessment - Initial Assessment Questions 1. APPEARANCE of BLOOD: "What color is it?" "Is it passed separately, on the surface of the stool, or mixed in with the stool?"      Clot with diarrhea 2. AMOUNT: "How much blood was passed?"      Only once 3. FREQUENCY: "How many times has blood been passed with the stools?"     Runny bowels 4. ONSET: "When was the blood first seen in the stools?" (Days or weeks)     today 5. DIARRHEA: "Is there also some diarrhea?" If so, ask: "How many diarrhea stools were passed in past 24 hours?"      Real frequest 6. CONSTIPATION: "Do you have constipation?" If so, "How bad is it?"    No 7. RECURRENT SYMPTOMS: "Have you had blood in your stools before?" If so, ask: "When was the last time?" and "What happened that time?"     Yes  8. BLOOD THINNERS: "Do you take any blood thinners?" (e.g., Coumadin/warfarin, Pradaxa/dabigatran, aspirin)    [place] 9. OTHER SYMPTOMS: "Do you have any other symptoms?"  (e.g., abdominal pain, vomiting, dizziness, fever)    Diarrhea, no pain 10. PREGNANCY: "Is there any chance you are pregnant?" "When was your last menstrual period?"       N/A  Protocols used: RECTAL BLEEDING-A-AH

## 2020-05-03 NOTE — ED Triage Notes (Signed)
Pt c/o diarrhea since Tuesday, today she noted blood in her stool. Denies abdominal pain/nausea/vomiting/dizziness

## 2020-05-03 NOTE — Discharge Instructions (Addendum)
You were evaluated in the Emergency Department and after careful evaluation, we did not find any emergent condition requiring admission or further testing in the hospital.  Your exam/testing today was overall reassuring.  As discussed, it is very important that you return to the emergency department with any worsening of bleeding.  We also encourage you to have your kidney function rechecked by your primary care doctor within the next 3 to 5 days.  Drink plenty of fluids at home to keep up with your diarrhea loss of fluids.  Please return to the Emergency Department if you experience any worsening of your condition.  We encourage you to follow up with a primary care provider.  Thank you for allowing [place] to be a part of your care.

## 2020-05-04 LAB — TYPE AND SCREEN
ABO/RH(D): AB POS
Antibody Screen: NEGATIVE

## 2020-05-09 DIAGNOSIS — M170 Bilateral primary osteoarthritis of knee: Secondary | ICD-10-CM | POA: Diagnosis not present

## 2020-05-14 ENCOUNTER — Inpatient Hospital Stay (HOSPITAL_COMMUNITY)
Admit: 2020-05-14 | Discharge: 2020-05-19 | DRG: 378 | Disposition: A | Discharge status: Home / Self Care | Payer: Medicare HMO | Attending: Internal Medicine | Admitting: Internal Medicine

## 2020-05-14 ENCOUNTER — Other Ambulatory Visit: Payer: Self-pay

## 2020-05-14 ENCOUNTER — Encounter (HOSPITAL_COMMUNITY): Payer: Self-pay | Admitting: *Deleted

## 2020-05-14 DIAGNOSIS — R791 Abnormal coagulation profile: Secondary | ICD-10-CM | POA: Diagnosis present

## 2020-05-14 DIAGNOSIS — Z888 Allergy status to other drugs, medicaments and biological substances status: Secondary | ICD-10-CM

## 2020-05-14 DIAGNOSIS — I7300 Raynaud's syndrome without gangrene: Secondary | ICD-10-CM | POA: Diagnosis present

## 2020-05-14 DIAGNOSIS — Z7902 Long term (current) use of antithrombotics/antiplatelets: Secondary | ICD-10-CM | POA: Diagnosis not present

## 2020-05-14 DIAGNOSIS — Z79899 Other long term (current) drug therapy: Secondary | ICD-10-CM | POA: Diagnosis not present

## 2020-05-14 DIAGNOSIS — E876 Hypokalemia: Secondary | ICD-10-CM | POA: Diagnosis not present

## 2020-05-14 DIAGNOSIS — K31811 Angiodysplasia of stomach and duodenum with bleeding: Principal | ICD-10-CM | POA: Diagnosis present

## 2020-05-14 DIAGNOSIS — E785 Hyperlipidemia, unspecified: Secondary | ICD-10-CM | POA: Diagnosis present

## 2020-05-14 DIAGNOSIS — I130 Hypertensive heart and chronic kidney disease with heart failure and stage 1 through stage 4 chronic kidney disease, or unspecified chronic kidney disease: Secondary | ICD-10-CM | POA: Diagnosis present

## 2020-05-14 DIAGNOSIS — Z96651 Presence of right artificial knee joint: Secondary | ICD-10-CM | POA: Diagnosis present

## 2020-05-14 DIAGNOSIS — N179 Acute kidney failure, unspecified: Secondary | ICD-10-CM | POA: Diagnosis not present

## 2020-05-14 DIAGNOSIS — I482 Chronic atrial fibrillation, unspecified: Secondary | ICD-10-CM | POA: Diagnosis present

## 2020-05-14 DIAGNOSIS — K921 Melena: Secondary | ICD-10-CM | POA: Diagnosis not present

## 2020-05-14 DIAGNOSIS — Z7901 Long term (current) use of anticoagulants: Secondary | ICD-10-CM

## 2020-05-14 DIAGNOSIS — Z6830 Body mass index (BMI) 30.0-30.9, adult: Secondary | ICD-10-CM

## 2020-05-14 DIAGNOSIS — I480 Paroxysmal atrial fibrillation: Secondary | ICD-10-CM | POA: Diagnosis not present

## 2020-05-14 DIAGNOSIS — I447 Left bundle-branch block, unspecified: Secondary | ICD-10-CM | POA: Diagnosis present

## 2020-05-14 DIAGNOSIS — Z20822 Contact with and (suspected) exposure to covid-19: Secondary | ICD-10-CM | POA: Diagnosis not present

## 2020-05-14 DIAGNOSIS — Z8616 Personal history of COVID-19: Secondary | ICD-10-CM | POA: Diagnosis not present

## 2020-05-14 DIAGNOSIS — Z87891 Personal history of nicotine dependence: Secondary | ICD-10-CM

## 2020-05-14 DIAGNOSIS — K922 Gastrointestinal hemorrhage, unspecified: Secondary | ICD-10-CM | POA: Diagnosis present

## 2020-05-14 DIAGNOSIS — D62 Acute posthemorrhagic anemia: Secondary | ICD-10-CM | POA: Diagnosis present

## 2020-05-14 DIAGNOSIS — K5710 Diverticulosis of small intestine without perforation or abscess without bleeding: Secondary | ICD-10-CM | POA: Diagnosis present

## 2020-05-14 DIAGNOSIS — I428 Other cardiomyopathies: Secondary | ICD-10-CM

## 2020-05-14 DIAGNOSIS — K648 Other hemorrhoids: Secondary | ICD-10-CM | POA: Diagnosis present

## 2020-05-14 DIAGNOSIS — K5790 Diverticulosis of intestine, part unspecified, without perforation or abscess without bleeding: Secondary | ICD-10-CM | POA: Diagnosis not present

## 2020-05-14 DIAGNOSIS — Z8673 Personal history of transient ischemic attack (TIA), and cerebral infarction without residual deficits: Secondary | ICD-10-CM

## 2020-05-14 DIAGNOSIS — N183 Chronic kidney disease, stage 3 unspecified: Secondary | ICD-10-CM | POA: Diagnosis present

## 2020-05-14 DIAGNOSIS — I2510 Atherosclerotic heart disease of native coronary artery without angina pectoris: Secondary | ICD-10-CM | POA: Diagnosis present

## 2020-05-14 DIAGNOSIS — N184 Chronic kidney disease, stage 4 (severe): Secondary | ICD-10-CM | POA: Diagnosis not present

## 2020-05-14 DIAGNOSIS — I5022 Chronic systolic (congestive) heart failure: Secondary | ICD-10-CM | POA: Diagnosis present

## 2020-05-14 DIAGNOSIS — Z825 Family history of asthma and other chronic lower respiratory diseases: Secondary | ICD-10-CM

## 2020-05-14 DIAGNOSIS — R197 Diarrhea, unspecified: Secondary | ICD-10-CM | POA: Diagnosis not present

## 2020-05-14 DIAGNOSIS — E559 Vitamin D deficiency, unspecified: Secondary | ICD-10-CM | POA: Diagnosis present

## 2020-05-14 DIAGNOSIS — Z87442 Personal history of urinary calculi: Secondary | ICD-10-CM

## 2020-05-14 DIAGNOSIS — Z8249 Family history of ischemic heart disease and other diseases of the circulatory system: Secondary | ICD-10-CM

## 2020-05-14 DIAGNOSIS — Z833 Family history of diabetes mellitus: Secondary | ICD-10-CM

## 2020-05-14 LAB — CBC WITH DIFFERENTIAL/PLATELET
Abs Immature Granulocytes: 0.08 10*3/uL — ABNORMAL HIGH (ref 0.00–0.07)
Basophils Absolute: 0 10*3/uL (ref 0.0–0.1)
Basophils Relative: 1 %
Eosinophils Absolute: 0.1 10*3/uL (ref 0.0–0.5)
Eosinophils Relative: 1 %
HCT: 28.7 % — ABNORMAL LOW (ref 36.0–46.0)
Hemoglobin: 8.8 g/dL — ABNORMAL LOW (ref 12.0–15.0)
Immature Granulocytes: 1 %
Lymphocytes Relative: 17 %
Lymphs Abs: 1.3 10*3/uL (ref 0.7–4.0)
MCH: 29.3 pg (ref 26.0–34.0)
MCHC: 30.7 g/dL (ref 30.0–36.0)
MCV: 95.7 fL (ref 80.0–100.0)
Monocytes Absolute: 0.7 10*3/uL (ref 0.1–1.0)
Monocytes Relative: 9 %
Neutro Abs: 5.4 10*3/uL (ref 1.7–7.7)
Neutrophils Relative %: 71 %
Platelets: 323 10*3/uL (ref 150–400)
RBC: 3 MIL/uL — ABNORMAL LOW (ref 3.87–5.11)
RDW: 18.3 % — ABNORMAL HIGH (ref 11.5–15.5)
WBC: 7.5 10*3/uL (ref 4.0–10.5)
nRBC: 0 % (ref 0.0–0.2)

## 2020-05-14 LAB — PROTIME-INR
INR: 1.9 — ABNORMAL HIGH (ref 0.8–1.2)
Prothrombin Time: 21.5 seconds — ABNORMAL HIGH (ref 11.4–15.2)

## 2020-05-14 LAB — BASIC METABOLIC PANEL
Anion gap: 12 (ref 5–15)
BUN: 45 mg/dL — ABNORMAL HIGH (ref 8–23)
CO2: 29 mmol/L (ref 22–32)
Calcium: 9.1 mg/dL (ref 8.9–10.3)
Chloride: 101 mmol/L (ref 98–111)
Creatinine, Ser: 2.04 mg/dL — ABNORMAL HIGH (ref 0.44–1.00)
GFR calc Af Amer: 29 mL/min — ABNORMAL LOW (ref >60)
GFR calc non Af Amer: 25 mL/min — ABNORMAL LOW (ref >60)
Glucose, Bld: 92 mg/dL (ref 70–99)
Potassium: 3.7 mmol/L (ref 3.5–5.1)
Sodium: 142 mmol/L (ref 135–145)

## 2020-05-14 LAB — SARS CORONAVIRUS 2 BY RT PCR (HOSPITAL ORDER, PERFORMED IN ~~LOC~~ HOSPITAL LAB): SARS Coronavirus 2: NEGATIVE

## 2020-05-14 MED ORDER — SODIUM CHLORIDE 0.9 % IV BOLUS
1000.0000 mL | Freq: Once | INTRAVENOUS | Status: AC
  Administered 2020-05-14: 1000 mL via INTRAVENOUS

## 2020-05-14 MED ORDER — SODIUM CHLORIDE 0.9 % IV SOLN: 10.0000 mL/h | Freq: Once | INTRAVENOUS | Status: DC

## 2020-05-14 NOTE — ED Provider Notes (Signed)
[facility] DEPT Provider Note   CSN: [id number] Arrival date & time: 05/14/20  1313     History Chief Complaint  Patient presents with  . Abdominal Pain    Patricia Wagner is a 69 y.o. female.  She has multiple medical problems including A. fib and is on anticoagulation.  She said she started with diarrhea about 5 days ago when she went to urgent care and they told her she had a virus.  About 3 days ago she started passing blood from her rectum sometimes with clots.  She says sometimes it is light red and sometimes dark red.  Not associated with any abdominal pain.  No fevers or chills cough chest pain shortness of breath.  No abdominal pain nausea or vomiting.  No urinary symptoms.  She said she had a prior episode of diverticular bleed in the past and she needed a colonoscopy in which they put a "stitch in there."  The history is provided by the patient.  Rectal Bleeding Quality:  Maroon Amount:  Moderate Timing:  Intermittent Chronicity:  Recurrent Context: diarrhea and spontaneously   Similar prior episodes: yes   Relieved by:  Nothing Worsened by:  Nothing Ineffective treatments:  None tried Associated symptoms: light-headedness and recent illness   Associated symptoms: no abdominal pain, no epistaxis, no fever, no hematemesis, no loss of consciousness and no vomiting   Risk factors: anticoagulant use        Past Medical History:  Diagnosis Date  . Acute blood loss anemia 03/05/2018  . Acute hypercapnic respiratory failure ([place]) 04/30/2017  . Acute renal failure superimposed on chronic kidney disease ([place]) 10/20/2016  . Arthritis    hands, knees  . Atrial fibrillation ([place])    a. s/p multiple cardioversions; failed tikosyn/sotalol.  . Bradycardia 11/20/2016  . CAD in native artery, s/p cardiac cath with non obstructive CAD 10/24/2016  . Cerebrovascular accident (CVA) due to embolism of left cerebellar artery ([place])   . CHF (congestive heart  failure) ([place])   . Chronic anticoagulation 03/15/2018  . Chronic atrial fibrillation ([place]) 03/05/2018  . Chronic systolic (congestive) heart failure ([place]) 03/05/2018  . CKD (chronic kidney disease) stage 3, GFR 30-59 ml/min   . Coagulopathy ([place]) 03/05/2018  . DIVERTICULITIS, HX OF 07/25/2007  . Diverticulosis of colon with hemorrhage 07/22/2007   Qualifier: Diagnosis of  By: [name]    . DIVERTICULOSIS, COLON 07/22/2007  . Dysuria 07/21/2017  . Edema, peripheral    a. chronic BLE edema, R>L. Prior trauma from dog attack and accident.  . Essential hypertension 07/22/2007   Qualifier: Diagnosis of  By: [name]    . History of kidney stones   . HLD (hyperlipidemia) 02/03/2008   Qualifier: Diagnosis of  By: [name], [name]   . HYPERLIPIDEMIA 02/03/2008  . Hypersomnia    declines w/u  . Hypertension   . Hypotension (arterial) 04/30/2017  . MENOPAUSAL DISORDER 01/09/2011  . Morbid obesity ([place]) 07/22/2007  . NICM (nonischemic cardiomyopathy) ([place]) 10/24/2016  . PAF (paroxysmal atrial fibrillation) ([place])   . Presence of indwelling urinary catheter    supra pubic cath  . Raynaud's syndrome 07/22/2007  . Stroke ([place]) 2017  . Suspected sleep apnea 05/20/2017  . THYROID NODULE, RIGHT 01/04/2010  . VITAMIN D DEFICIENCY 01/09/2011   Qualifier: Diagnosis of  By: [name], [name]     Patient Active Problem List   Diagnosis Date Noted  . Primary osteoarthritis of right knee 03/20/2020  .  History of lower GI bleeding 03/20/2020  . Acute lower GI bleeding 01/18/2020  . Acquired thrombophilia ([place]) 11/14/2019  . Persistent atrial fibrillation ([place]) 10/13/2019  . Allergic rhinitis 09/10/2019  . Left ear pain 09/10/2019  . Vitamin D deficiency 04/15/2019  . Cardiogenic shock ([place])   . Acute on chronic respiratory failure with hypoxia ([place])   . Elevated troponin 08/05/2018  . Hyperbilirubinemia 08/05/2018  . Abnormal transaminases 08/05/2018  . Dysarthria 08/05/2018  . Lower GI bleed  03/15/2018  . Chronic anticoagulation 03/15/2018  . Rectal bleed   . Acute renal failure superimposed on stage 4 chronic kidney disease ([place]) 03/05/2018  . Coagulopathy ([place]) 03/05/2018  . Chronic systolic (congestive) heart failure ([place]) 03/05/2018  . Chronic atrial fibrillation 03/05/2018  . Acute blood loss anemia 03/05/2018  . Diverticulosis 03/05/2018  . Heme positive stool 03/05/2018  . Urine abnormality 02/02/2018  . Cough 02/02/2018  . Bacteremia due to Gram-negative bacteria 12/30/2017  . Increased anion gap metabolic acidosis 53/29/9242  . Metabolic alkalosis 68/34/1962  . Acute UTI 12/29/2017  . Obstructive uropathy   . Anemia 12/17/2017  . AKI (acute kidney injury) ([place]) 11/30/2017  . Diarrhea 11/30/2017  . Urinary retention 11/23/2017  . Lower abdominal pain 11/23/2017  . Dysuria 07/21/2017  . Suspected sleep apnea 05/20/2017  . Pressure injury of skin 05/02/2017  . Acute respiratory failure with hypoxia ([place]) 04/30/2017  . Hypotension (arterial) 04/30/2017  . Bradycardia 11/20/2016  . Chronic combined systolic (congestive) and diastolic (congestive) heart failure ([place]) 11/20/2016  . Cerebrovascular accident (CVA) due to embolism of left cerebellar artery ([place])   . PAF (paroxysmal atrial fibrillation) ([place])   . CHF (congestive heart failure) ([place]) 10/31/2016  . CAD in native artery, s/p cardiac cath with non obstructive CAD 10/24/2016  . NICM (nonischemic cardiomyopathy) ([place]) 10/24/2016  . Tobacco abuse 10/20/2016  . Acute renal failure superimposed on chronic kidney disease ([place]) 10/20/2016  . Hematochezia 06/29/2013  . Degenerative arthritis of left knee 03/15/2012  . Encounter for well adult exam with abnormal findings 03/12/2012  . Hypersomnia   . MENOPAUSAL DISORDER 01/09/2011  . THYROID NODULE, RIGHT 01/04/2010  . HLD (hyperlipidemia) 02/03/2008  . LEG CRAMPS, NOCTURNAL 02/03/2008  . LOW BACK PAIN 07/25/2007  . Morbid obesity ([place]) 07/22/2007  .  Essential hypertension 07/22/2007  . Raynaud's syndrome 07/22/2007  . Diverticulosis of colon with hemorrhage 07/22/2007  . SYMPTOM, EDEMA 07/22/2007    Past Surgical History:  Procedure Laterality Date  . ATRIAL FIBRILLATION ABLATION N/A 10/13/2019   Procedure: ATRIAL FIBRILLATION ABLATION;  Surgeon: [name], MD;  Location: [facility];  Service: Cardiovascular;  Laterality: N/A;  . CARDIAC CATHETERIZATION N/A 10/23/2016   Procedure: Left Heart Cath and Coronary Angiography;  Surgeon: [name], MD;  Location: [facility];  Service: Cardiovascular;  Laterality: N/A;  . CARDIOVERSION N/A 10/31/2016   Procedure: CARDIOVERSION;  Surgeon: [name], MD;  Location: [facility] ENDOSCOPY;  Service: Cardiovascular;  Laterality: N/A;  . CARDIOVERSION N/A 11/03/2016   Procedure: CARDIOVERSION;  Surgeon: [name], MD;  Location: [facility] ENDOSCOPY;  Service: Cardiovascular;  Laterality: N/A;  . CARDIOVERSION N/A 11/18/2016   Procedure: CARDIOVERSION;  Surgeon: [name], MD;  Location: [facility] ENDOSCOPY;  Service: Cardiovascular;  Laterality: N/A;  . CARDIOVERSION N/A 03/25/2017   Procedure: CARDIOVERSION;  Surgeon: [name], MD;  Location: [facility] ENDOSCOPY;  Service: Cardiovascular;  Laterality: N/A;  . CARDIOVERSION N/A 05/06/2017   Procedure: CARDIOVERSION;  Surgeon: Glori Bickers  R, MD;  Location: [facility];  Service: Cardiovascular;  Laterality: N/A;  . CARDIOVERSION N/A 05/12/2017   Procedure: CARDIOVERSION;  Surgeon: [name], MD;  Location: [facility] ENDOSCOPY;  Service: Cardiovascular;  Laterality: N/A;  . COLONOSCOPY N/A 12/04/2017   Procedure: COLONOSCOPY;  Surgeon: [name], MD;  Location: [facility] ENDOSCOPY;  Service: Endoscopy;  Laterality: N/A;  . COLONOSCOPY    . COLONOSCOPY W/ POLYPECTOMY  02/2011   pan diverticulosis.  tubular adenoma without dysplasia on 5 mm sigmoid polyp.  Dr Fuller Plan.    Marland Kitchen FLEXIBLE SIGMOIDOSCOPY N/A 01/18/2020   Procedure:  FLEXIBLE SIGMOIDOSCOPY;  Surgeon: [name], DO;  Location: [facility];  Service: Gastroenterology;  Laterality: N/A;  . HEMOSTASIS CLIP PLACEMENT  01/18/2020   Procedure: HEMOSTASIS CLIP PLACEMENT;  Surgeon: [name], DO;  Location: [facility] ENDOSCOPY;  Service: Gastroenterology;;  . PARTIAL HYSTERECTOMY     1 OVARY LEFT  . SCLEROTHERAPY  01/18/2020   Procedure: SCLEROTHERAPY;  Surgeon: [name], DO;  Location: [facility] ENDOSCOPY;  Service: Gastroenterology;;  . TEE WITHOUT CARDIOVERSION N/A 10/31/2016   Procedure: TRANSESOPHAGEAL ECHOCARDIOGRAM (TEE);  Surgeon: [name], MD;  Location: [facility] ENDOSCOPY;  Service: Cardiovascular;  Laterality: N/A;  . TEE WITHOUT CARDIOVERSION N/A 03/25/2017   Procedure: TRANSESOPHAGEAL ECHOCARDIOGRAM (TEE);  Surgeon: [name], MD;  Location: [facility] ENDOSCOPY;  Service: Cardiovascular;  Laterality: N/A;  . TOTAL KNEE ARTHROPLASTY Right 04/10/2020   Procedure: TOTAL KNEE ARTHROPLASTY;  Surgeon: Renette Butters, MD;  Location: [facility] ORS;  Service: Orthopedics;  Laterality: Right;     OB History   No obstetric history on file.     Family History  Problem Relation Age of Onset  . Asthma Mother   . Diabetes Father   . Heart disease Father        Died of presumed heart attack - 25s  . Lung disease Sister   . Heart disease Sister        Twin sister has heart issue, unclear what kind  . Thyroid disease Neg Hx   . Colon polyps Neg Hx   . Esophageal cancer Neg Hx   . Pancreatic cancer Neg Hx   . Stomach cancer Neg Hx     Social History   Tobacco Use  . Smoking status: Former Smoker    Packs/day: 0.50    Types: Cigarettes    Start date: 2019  . Smokeless tobacco: Never Used  Vaping Use  . Vaping Use: Never used  Substance Use Topics  . Alcohol use: Not Currently    Alcohol/week: 4.0 standard drinks    Types: 4 Cans of beer per week    Comment: Intermittent  . Drug use: No    Home Medications Prior to Admission medications     Medication Sig Start Date End Date Taking? Authorizing Provider  amiodarone (PACERONE) 200 MG tablet Take 200 mg by mouth daily. 03/08/20   [provider]  apixaban (ELIQUIS) 5 MG TABS tablet Take 1 tablet (5 mg total) by mouth 2 (two) times daily. Patient taking differently: Take 5 mg by mouth in the morning, at noon, and at bedtime.  03/23/20   Camnitz, Ocie Doyne, MD  baclofen (LIORESAL) 10 MG tablet Take 1 tablet (10 mg total) by mouth 3 (three) times daily as needed for muscle spasms. Patient not taking: Reported on 05/03/2020 04/10/20   Prudencio Burly III, PA-C  Cholecalciferol (VITAMIN D3) 25 MCG (1000 UT) CAPS Take 1,000 Units by mouth daily.  [provider]  furosemide (LASIX) 80 MG tablet TAKE 1 TABLET BY MOUTH EVERY DAY Patient taking differently: Take 80 mg by mouth daily.  11/09/19   Bensimhon, Shaune Pascal, MD  gabapentin (NEURONTIN) 100 MG capsule Take 1 capsule (100 mg total) by mouth 2 (two) times daily for 14 days. For pain. Patient not taking: Reported on 05/03/2020 04/10/20 05/03/29  Prudencio Burly III, PA-C  isosorbide mononitrate (IMDUR) 30 MG 24 hr tablet TAKE HALF A TABLET BY MOUTH EVERY DAY Patient taking differently: Take 15 mg by mouth daily.  09/07/19   Biagio Borg, MD  ondansetron (ZOFRAN ODT) 4 MG disintegrating tablet Take 1 tablet (4 mg total) by mouth every 8 (eight) hours as needed for nausea or vomiting. 05/03/20   Maudie Flakes, MD  ondansetron (ZOFRAN) 4 MG tablet Take 1 tablet (4 mg total) by mouth every 8 (eight) hours as needed for nausea or vomiting. 04/10/20   Prudencio Burly III, PA-C  pantoprazole (PROTONIX) 40 MG tablet TAKE 1 TABLET BY MOUTH EVERY DAY Patient taking differently: Take 40 mg by mouth daily.  03/23/20   Camnitz, Ocie Doyne, MD  potassium chloride SA (KLOR-CON M20) 20 MEQ tablet Take 2 tablets (40 mEq total) by mouth daily. Needs appt for further refills 04/06/20   Larey Dresser, MD  ranolazine  (RANEXA) 500 MG 12 hr tablet TAKE 1 TABLET BY MOUTH TWICE A DAY Patient taking differently: Take 500 mg by mouth 2 (two) times daily.  12/21/19   Larey Dresser, MD  rosuvastatin (CRESTOR) 10 MG tablet Take 1 tablet (10 mg total) by mouth daily. 09/07/19   Biagio Borg, MD  VOLTAREN 1 % GEL Apply 1 application topically 2 (two) times daily as needed (Knee pain).  09/14/19   [provider]    Allergies    Ace inhibitors  Review of Systems   Review of Systems  Constitutional: Negative for fever.  HENT: Negative for nosebleeds and sore throat.   Eyes: Negative for visual disturbance.  Respiratory: Negative for shortness of breath.   Cardiovascular: Negative for chest pain.  Gastrointestinal: Positive for anal bleeding, diarrhea and hematochezia. Negative for abdominal pain, hematemesis, rectal pain and vomiting.  Genitourinary: Negative for dysuria.  Musculoskeletal: Negative for back pain.  Skin: Negative for rash.  Neurological: Positive for light-headedness. Negative for loss of consciousness.    Physical Exam Updated Vital Signs BP (!) 173/81 (BP Location: Right Arm)   Pulse 79   Temp 99 F (37.2 C) (Oral)   Resp 18   SpO2 91%   Physical Exam Vitals and nursing note reviewed.  Constitutional:      General: She is not in acute distress.    Appearance: She is well-developed.  HENT:     Head: Normocephalic and atraumatic.  Eyes:     Conjunctiva/sclera: Conjunctivae normal.  Cardiovascular:     Rate and Rhythm: Normal rate and regular rhythm.     Heart sounds: No murmur heard.   Pulmonary:     Effort: Pulmonary effort is normal. No respiratory distress.     Breath sounds: Normal breath sounds.  Abdominal:     Palpations: Abdomen is soft.     Tenderness: There is no abdominal tenderness. There is no guarding or rebound.  Musculoskeletal:        General: No deformity or signs of injury. Normal range of motion.     Cervical back: Neck supple.  Skin:     General:  Skin is warm and dry.     Capillary Refill: Capillary refill takes less than 2 seconds.  Neurological:     General: No focal deficit present.     Mental Status: She is alert.     Sensory: No sensory deficit.     Motor: No weakness.     ED Results / Procedures / Treatments   Labs (all labs ordered are listed, but only abnormal results are displayed) Labs Reviewed  BASIC METABOLIC PANEL - Abnormal; Notable for the following components:      Result Value   BUN 45 (*)    Creatinine, Ser 2.04 (*)    GFR calc non Af Amer 25 (*)    GFR calc Af Amer 29 (*)    All other components within normal limits  CBC WITH DIFFERENTIAL/PLATELET - Abnormal; Notable for the following components:   RBC 3.00 (*)    Hemoglobin 8.8 (*)    HCT 28.7 (*)    RDW 18.3 (*)    Abs Immature Granulocytes 0.08 (*)    All other components within normal limits  PROTIME-INR - Abnormal; Notable for the following components:   Prothrombin Time 21.5 (*)    INR 1.9 (*)    All other components within normal limits  BASIC METABOLIC PANEL - Abnormal; Notable for the following components:   Potassium 3.4 (*)    Glucose, Bld 114 (*)    BUN 39 (*)    Creatinine, Ser 1.69 (*)    Calcium 8.7 (*)    GFR calc non Af Amer 31 (*)    GFR calc Af Amer 36 (*)    All other components within normal limits  CBC - Abnormal; Notable for the following components:   RBC 3.60 (*)    Hemoglobin 10.3 (*)    HCT 33.4 (*)    RDW 18.6 (*)    All other components within normal limits  SARS CORONAVIRUS 2 BY RT PCR (HOSPITAL ORDER, [facility])  CBG MONITORING, ED  TYPE AND SCREEN  ABO/RH  PREPARE RBC (CROSSMATCH)    EKG EKG Interpretation  Date/Time:  Monday May 14 2020 20:21:38 EDT Ventricular Rate:  71 PR Interval:    QRS Duration: 138 QT Interval:  465 QTC Calculation: 506 R Axis:   65 Text Interpretation: Sinus rhythm Left bundle branch block Artifact in lead(s) I III aVR aVL aVF V1 V2  V3 V4 V6 No significant change since prior 2/21 Confirmed by Aletta Edouard [phone]) on 05/14/2020 8:30:18 PM   Radiology No results found.  Procedures .Critical Care Performed by: Hayden Rasmussen, MD Authorized by: Hayden Rasmussen, MD   Critical care provider statement:    Critical care time (minutes):  45   Critical care time was exclusive of:  Separately billable procedures and treating other patients   Critical care was necessary to treat or prevent imminent or life-threatening deterioration of the following conditions:  Circulatory failure   Critical care was time spent personally by me on the following activities:  Discussions with consultants, evaluation of patient's response to treatment, examination of patient, ordering and performing treatments and interventions, ordering and review of laboratory studies, ordering and review of radiographic studies, pulse oximetry, re-evaluation of patient's condition, obtaining history from patient or surrogate, review of old charts and development of treatment plan with patient or surrogate   I assumed direction of critical care for this patient from another provider in my specialty: no     (  including critical care time)  Medications Ordered in ED Medications  0.9 %  sodium chloride infusion (0 mL/hr Intravenous Stopped 05/15/20 0955)  ondansetron (ZOFRAN) tablet 4 mg (has no administration in time range)    Or  ondansetron (ZOFRAN) injection 4 mg (has no administration in time range)  baclofen (LIORESAL) tablet 5 mg (has no administration in time range)  pantoprazole (PROTONIX) EC tablet 40 mg (has no administration in time range)  ranolazine (RANEXA) 12 hr tablet 500 mg (has no administration in time range)  isosorbide mononitrate (IMDUR) 24 hr tablet 30 mg (has no administration in time range)  sodium chloride 0.9 % bolus 1,000 mL (0 mLs Intravenous Stopped 05/14/20 2327)    ED Course  I have reviewed the triage vital signs and the  nursing notes.  Pertinent labs & imaging results that were available during my care of the patient were reviewed by me and considered in my medical decision making (see chart for details).  Clinical Course as of May 15 1038  Mon May 14, 2020  2145 Rectal exam done with nurse as chaperone.  She had maroon-colored stool in vault.  No masses appreciated.   [MB]  2152 Took a while to get labs sent but currently there are now at lab.   [MB]  2250 Discussed with [facility] Dr Nyoka Cowden.  She did not think she needed any imaging currently, recommends admit to the hospital and if she has worsening GI bleeding she may need a tagged scan.   [MB]  2315 Discussed with Dr. Hal Hope from [facility] hospitalist will evaluate the patient for admission.   [MB]  2351 Patient having more active bleeding again.  Have discussed with hospitalist and have ordered her 2 units of blood.   [MB]    Clinical Course User Index [MB] Hayden Rasmussen, MD   MDM Rules/Calculators/A&P                         This patient complains of rectal bleeding; this involves an extensive number of treatment Options and is a complaint that carries with it a high risk of complications and Morbidity. The differential includes diverticulitis, diverticulosis, AVM, rectal tumor, anemia  I ordered, reviewed and interpreted labs, which included CBC with normal white count, low hemoglobin of 8.8 down from 11.411 days ago, chemistries with elevation of BUN and creatinine, INR elevated, Covid testing negative I ordered medication IV fluids, transfusion of packed red blood cells Previous records obtained and reviewed in epic including most recent urgent care visit I consulted Dr. [facility] and [facility] hospitalist Dr. Hal Hope and discussed lab and imaging findings  Critical Interventions: Management of GI bleed with transfusion of packed red blood cells for active bleeding in the setting of anticoagulation  After the interventions stated  above, I reevaluated the patient and found To be hemodynamically stable.  After admission she began having some more GI bleeding.  Blood pressure remained stable but ordered blood transfusion.  Will need admission for further management including likely tagged red blood cell scan   Final Clinical Impression(s) / ED Diagnoses Final diagnoses:  Acute lower GI bleeding    Rx / DC Orders ED Discharge Orders    None       Hayden Rasmussen, MD 05/15/20 1043

## 2020-05-14 NOTE — ED Triage Notes (Signed)
Difficult to understand what is happening to pt. She states at intervals she has diarrhea with bright blood, keeps saying it is coming from a virus,

## 2020-05-15 ENCOUNTER — Encounter (HOSPITAL_COMMUNITY): Payer: Self-pay | Admitting: Internal Medicine

## 2020-05-15 ENCOUNTER — Inpatient Hospital Stay (HOSPITAL_COMMUNITY): Payer: Medicare HMO

## 2020-05-15 DIAGNOSIS — K5790 Diverticulosis of intestine, part unspecified, without perforation or abscess without bleeding: Secondary | ICD-10-CM

## 2020-05-15 DIAGNOSIS — D62 Acute posthemorrhagic anemia: Secondary | ICD-10-CM

## 2020-05-15 DIAGNOSIS — Z7901 Long term (current) use of anticoagulants: Secondary | ICD-10-CM

## 2020-05-15 DIAGNOSIS — K922 Gastrointestinal hemorrhage, unspecified: Secondary | ICD-10-CM

## 2020-05-15 LAB — PREPARE RBC (CROSSMATCH)

## 2020-05-15 LAB — BASIC METABOLIC PANEL
Anion gap: 10 (ref 5–15)
BUN: 39 mg/dL — ABNORMAL HIGH (ref 8–23)
CO2: 29 mmol/L (ref 22–32)
Calcium: 8.7 mg/dL — ABNORMAL LOW (ref 8.9–10.3)
Chloride: 102 mmol/L (ref 98–111)
Creatinine, Ser: 1.69 mg/dL — ABNORMAL HIGH (ref 0.44–1.00)
GFR calc Af Amer: 36 mL/min — ABNORMAL LOW (ref >60)
GFR calc non Af Amer: 31 mL/min — ABNORMAL LOW (ref >60)
Glucose, Bld: 114 mg/dL — ABNORMAL HIGH (ref 70–99)
Potassium: 3.4 mmol/L — ABNORMAL LOW (ref 3.5–5.1)
Sodium: 141 mmol/L (ref 135–145)

## 2020-05-15 LAB — CBC
HCT: 33.4 % — ABNORMAL LOW (ref 36.0–46.0)
Hemoglobin: 10.3 g/dL — ABNORMAL LOW (ref 12.0–15.0)
MCH: 28.6 pg (ref 26.0–34.0)
MCHC: 30.8 g/dL (ref 30.0–36.0)
MCV: 92.8 fL (ref 80.0–100.0)
Platelets: 290 10*3/uL (ref 150–400)
RBC: 3.6 MIL/uL — ABNORMAL LOW (ref 3.87–5.11)
RDW: 18.6 % — ABNORMAL HIGH (ref 11.5–15.5)
WBC: 6.4 10*3/uL (ref 4.0–10.5)
nRBC: 0 % (ref 0.0–0.2)

## 2020-05-15 LAB — CBG MONITORING, ED
Glucose-Capillary: 72 mg/dL (ref 70–99)
Glucose-Capillary: 80 mg/dL (ref 70–99)

## 2020-05-15 LAB — GLUCOSE, CAPILLARY: Glucose-Capillary: 67 mg/dL — ABNORMAL LOW (ref 70–99)

## 2020-05-15 LAB — ABO/RH: ABO/RH(D): AB POS

## 2020-05-15 LAB — HEMOGLOBIN: Hemoglobin: 9.6 g/dL — ABNORMAL LOW (ref 12.0–15.0)

## 2020-05-15 MED ORDER — ONDANSETRON HCL 4 MG PO TABS: 4.0000 mg | ORAL_TABLET | Freq: Four times a day (QID) | ORAL | Status: DC | PRN

## 2020-05-15 MED ORDER — ONDANSETRON HCL 4 MG/2ML IJ SOLN: 4.0000 mg | Freq: Four times a day (QID) | INTRAMUSCULAR | Status: DC | PRN

## 2020-05-15 MED ORDER — CHLORHEXIDINE GLUCONATE CLOTH 2 % EX PADS
6.0000 | MEDICATED_PAD | Freq: Every day | CUTANEOUS | Status: DC
  Administered 2020-05-16 – 2020-05-19 (×4): 6 via TOPICAL

## 2020-05-15 MED ORDER — SODIUM CHLORIDE 0.9 % IV SOLN
8.0000 mg/h | INTRAVENOUS | Status: DC
  Administered 2020-05-16 (×2): 8 mg/h via INTRAVENOUS
  Filled 2020-05-15 (×3): qty 80
  Filled 2020-05-15: qty 70

## 2020-05-15 MED ORDER — BACLOFEN 10 MG PO TABS: 5.0000 mg | ORAL_TABLET | Freq: Three times a day (TID) | ORAL | Status: DC | PRN

## 2020-05-15 MED ORDER — TECHNETIUM TC 99M-LABELED RED BLOOD CELLS IV KIT
20.0000 | PACK | Freq: Once | INTRAVENOUS | Status: AC | PRN
  Administered 2020-05-15: 20 via INTRAVENOUS

## 2020-05-15 MED ORDER — DEXTROSE 50 % IV SOLN
12.5000 g | INTRAVENOUS | Status: AC
  Administered 2020-05-16: 12.5 g via INTRAVENOUS
  Filled 2020-05-15: qty 50

## 2020-05-15 MED ORDER — PANTOPRAZOLE SODIUM 40 MG PO TBEC
40.0000 mg | DELAYED_RELEASE_TABLET | Freq: Every day | ORAL | Status: DC
  Administered 2020-05-15: 40 mg via ORAL
  Filled 2020-05-15: qty 1

## 2020-05-15 MED ORDER — ISOSORBIDE MONONITRATE ER 30 MG PO TB24
30.0000 mg | ORAL_TABLET | Freq: Every day | ORAL | Status: DC
  Administered 2020-05-15: 30 mg via ORAL
  Filled 2020-05-15: qty 1

## 2020-05-15 MED ORDER — PANTOPRAZOLE SODIUM 40 MG IV SOLR: 40.0000 mg | Freq: Two times a day (BID) | INTRAVENOUS | Status: DC

## 2020-05-15 MED ORDER — SODIUM CHLORIDE 0.9 % IV SOLN
80.0000 mg | Freq: Once | INTRAVENOUS | Status: AC
  Administered 2020-05-15: 80 mg via INTRAVENOUS
  Filled 2020-05-15: qty 80

## 2020-05-15 MED ORDER — RANOLAZINE ER 500 MG PO TB12
500.0000 mg | ORAL_TABLET | Freq: Two times a day (BID) | ORAL | Status: DC
  Administered 2020-05-15 – 2020-05-19 (×8): 500 mg via ORAL
  Filled 2020-05-15 (×10): qty 1

## 2020-05-15 NOTE — Consult Note (Signed)
Referring Provider:  [facility]         Primary Care Physician:  Maximiano Coss, NP Primary Gastroenterologist:    Lucio Edward, MD           We were asked to see this patient for:   Rectal bleeding             ASSESSMENT /  PLAN    Patricia Wagner is a 69 y.o. female PMH significant for, but not necessarily limited to, Afib on Eliquis, non-ischemic cardiomyopathy, CKD 3, CVA, Raynaud's morbid obesity, diverticulosis / diverticular hemorrhage, adenomatous colon polyps.   # Painless hematochezia on Eliquis, suspect recurrent diverticular hemorrhage.  --She had a flexible sigmoidoscopy with control of bleeding from sigmoid diverticulum in Feb 2021  --blood clots on exam today. Will send for tagged RBC scan.  --Make NPO in case endoscopic evaluation necessary  # Hypoxia, sats in low to upper 80s early this am. --Asymptomatic -- I discussed with [facility].  --Sats now improved to mid to upper 90s on room air  # Afib, on Eliquis --Last dose of Eliquis around 11:30 AM yesterday --Several admissions for recurrent GI bleeding.  Risk of discontinuing anti-coagulant indefinitely?   HPI:    Chief Complaint: rectal bleeding   Patricia Wagner is a 69 y.o. female with a multiple medical problems as listed above and also history of  several admission for GIB on blood thinners.   12/03/17 Admitted with painless hematochezia with supratherapeutic INR. Felt to be a diverticular hemorrhage. Transfused 2 u PRBC.    03/06/18 Admitted with melena and anemia with supratherapeutic INR. Transfused PRBC. Coumadin reversed with Vit K and K centra. No endoscopy done. Changed to Xarelto.   03/16/18 Readmitted with hematochezia. Negative tagged RBC scan. Felt to be diverticular  hemorrhage  01/18/20 Readmitted with hematochezia on Eliquis, felt to be diverticular hemorrhage . RBC scan remarkable for bleeding in LLQ, probably distal sigmoid. Flexible sigmoidoscopy showed active bleeding diverticulum in sigmoid  treated with clip and epi injection. Transfused PRBC.    01/30/20 Hospital follow in office.  Doing well. Hgb had improved.   INTERVAL HISTORY :   Patient presented to ED yesterday with diarrhea with blood. Hgb was initially at baseline of 11.4 but last night declined to 8.8.  Patient says she had a virus last week a couple of weeks ago.Marland Kitchen  Her symptoms mainly included diarrhea, some nausea.  She is not sure if the diarrhea contained blood.  She came to the emergency department on 05/03/2020 for evaluation.  At that time her hemoglobin was 11.4, down from 12 early May of this year.  She had AKI on CKD.  Discharged home with Zofran.  The diarrhea stopped but then at some point (patient is unsure but thinks a couple days ago (she started having loose stools again.  She did notice blood in the stool.  Patient says this episode of bleeding is different than previous diverticular hemorrhages in that she is passing stool this time.  In previous times she was passing just blood with clots.  She has no abdominal pain.  No nausea.  Her last Eliquis was yesterday morning.  Prior to the dose yesterday morning she had not been taking it for a few days, just did not feel well.   PREVIOUS ENDOSCOPIC EVALUATIONS / GI STUDIES :  11/2017 Colonoscopy  --Examined portion of the ileum was normal.   --Diffuse Diverticulosis  01/18/20 Flexible sigmoidoscopy -Diverticulosis in the sigmoid colon with active,  brisk bleeding. This was successfully treated with hemostatic clips x2 and Epinephrine injection with cessation of bleeding. - Blood in the rectum, in the recto-sigmoid colon and in the sigmoid colon. - Non-bleeding internal hemorrhoids. - No specimens collected.  Past Medical History:  Diagnosis Date  . Acute blood loss anemia 03/05/2018  . Acute hypercapnic respiratory failure ([place]) 04/30/2017  . Acute renal failure superimposed on chronic kidney disease ([place]) 10/20/2016  . Arthritis    hands, knees  . Atrial  fibrillation ([place])    a. s/p multiple cardioversions; failed tikosyn/sotalol.  . Bradycardia 11/20/2016  . CAD in native artery, s/p cardiac cath with non obstructive CAD 10/24/2016  . Cerebrovascular accident (CVA) due to embolism of left cerebellar artery ([place])   . CHF (congestive heart failure) ([place])   . Chronic anticoagulation 03/15/2018  . Chronic atrial fibrillation ([place]) 03/05/2018  . Chronic systolic (congestive) heart failure ([place]) 03/05/2018  . CKD (chronic kidney disease) stage 3, GFR 30-59 ml/min   . Coagulopathy ([place]) 03/05/2018  . DIVERTICULITIS, HX OF 07/25/2007  . Diverticulosis of colon with hemorrhage 07/22/2007   Qualifier: Diagnosis of  By: [name]    . DIVERTICULOSIS, COLON 07/22/2007  . Dysuria 07/21/2017  . Edema, peripheral    a. chronic BLE edema, R>L. Prior trauma from dog attack and accident.  . Essential hypertension 07/22/2007   Qualifier: Diagnosis of  By: [name]    . History of kidney stones   . HLD (hyperlipidemia) 02/03/2008   Qualifier: Diagnosis of  By: [name], [name]   . HYPERLIPIDEMIA 02/03/2008  . Hypersomnia    declines w/u  . Hypertension   . Hypotension (arterial) 04/30/2017  . MENOPAUSAL DISORDER 01/09/2011  . Morbid obesity ([place]) 07/22/2007  . NICM (nonischemic cardiomyopathy) ([place]) 10/24/2016  . PAF (paroxysmal atrial fibrillation) ([place])   . Presence of indwelling urinary catheter    supra pubic cath  . Raynaud's syndrome 07/22/2007  . Stroke ([place]) 2017  . Suspected sleep apnea 05/20/2017  . THYROID NODULE, RIGHT 01/04/2010  . VITAMIN D DEFICIENCY 01/09/2011   Qualifier: Diagnosis of  By: [name], [name]     Past Surgical History:  Procedure Laterality Date  . ATRIAL FIBRILLATION ABLATION N/A 10/13/2019   Procedure: ATRIAL FIBRILLATION ABLATION;  Surgeon: [name], MD;  Location: [place] [facility];  Service: Cardiovascular;  Laterality: N/A;  . CARDIAC CATHETERIZATION N/A 10/23/2016   Procedure: Left Heart Cath  and Coronary Angiography;  Surgeon: [name], MD;  Location: [place] [facility];  Service: Cardiovascular;  Laterality: N/A;  . CARDIOVERSION N/A 10/31/2016   Procedure: CARDIOVERSION;  Surgeon: [name], MD;  Location: [place];  Service: Cardiovascular;  Laterality: N/A;  . CARDIOVERSION N/A 11/03/2016   Procedure: CARDIOVERSION;  Surgeon: [name], MD;  Location: [place];  Service: Cardiovascular;  Laterality: N/A;  . CARDIOVERSION N/A 11/18/2016   Procedure: CARDIOVERSION;  Surgeon: [name], MD;  Location: [place];  Service: Cardiovascular;  Laterality: N/A;  . CARDIOVERSION N/A 03/25/2017   Procedure: CARDIOVERSION;  Surgeon: [name], MD;  Location: [facility] ENDOSCOPY;  Service: Cardiovascular;  Laterality: N/A;  . CARDIOVERSION N/A 05/06/2017   Procedure: CARDIOVERSION;  Surgeon: [name], MD;  Location[facility] ENDOSCOPY;  Service: Cardiovascular;  Laterality: N/A;  . CARDIOVERSION N/A 05/12/2017   Procedure: CARDIOVERSION;  Surgeon: [name], MD;  Location: [place];  Service: Cardiovascular;  Laterality: N/A;  . COLONOSCOPY N/A 12/04/2017   Procedure: COLONOSCOPY;  Surgeon: [name], MD;  Location: [facility] ENDOSCOPY;  Service: Endoscopy;  Laterality: N/A;  . COLONOSCOPY    . COLONOSCOPY W/ POLYPECTOMY  02/2011   pan diverticulosis.  tubular adenoma without dysplasia on 5 mm sigmoid polyp.  Dr Fuller Plan.    Marland Kitchen FLEXIBLE SIGMOIDOSCOPY N/A 01/18/2020   Procedure: FLEXIBLE SIGMOIDOSCOPY;  Surgeon: [name], DO;  Location: [place];  Service: Gastroenterology;  Laterality: N/A;  . HEMOSTASIS CLIP PLACEMENT  01/18/2020   Procedure: HEMOSTASIS CLIP PLACEMENT;  Surgeon: [name], DO;  Location: [facility] ENDOSCOPY;  Service: Gastroenterology;;  . PARTIAL HYSTERECTOMY     1 OVARY LEFT  . SCLEROTHERAPY  01/18/2020   Procedure: SCLEROTHERAPY;  Surgeon: [name], DO;  Location: [place] ENDOSCOPY;  Service:  Gastroenterology;;  . TEE WITHOUT CARDIOVERSION N/A 10/31/2016   Procedure: TRANSESOPHAGEAL ECHOCARDIOGRAM (TEE);  Surgeon: [name], MD;  Location: [facility] ENDOSCOPY;  Service: Cardiovascular;  Laterality: N/A;  . TEE WITHOUT CARDIOVERSION N/A 03/25/2017   Procedure: TRANSESOPHAGEAL ECHOCARDIOGRAM (TEE);  Surgeon: [name], MD;  Location: [facility] ENDOSCOPY;  Service: Cardiovascular;  Laterality: N/A;  . TOTAL KNEE ARTHROPLASTY Right 04/10/2020   Procedure: TOTAL KNEE ARTHROPLASTY;  Surgeon: Renette Butters, MD;  Location: [facility] ORS;  Service: Orthopedics;  Laterality: Right;    Prior to Admission medications   Medication Sig Start Date End Date Taking? Authorizing Provider  apixaban (ELIQUIS) 5 MG TABS tablet Take 1 tablet (5 mg total) by mouth 2 (two) times daily. Patient taking differently: Take 5 mg by mouth in the morning, at noon, and at bedtime.  03/23/20  Yes Camnitz, Will Hassell Done, MD  Cholecalciferol (VITAMIN D3) 25 MCG (1000 UT) CAPS Take 1,000 Units by mouth daily.   Yes [provider]  furosemide (LASIX) 80 MG tablet TAKE 1 TABLET BY MOUTH EVERY DAY Patient taking differently: Take 80 mg by mouth daily.  11/09/19  Yes Bensimhon, Shaune Pascal, MD  isosorbide mononitrate (IMDUR) 30 MG 24 hr tablet TAKE HALF A TABLET BY MOUTH EVERY DAY Patient taking differently: Take 15 mg by mouth daily.  09/07/19  Yes Biagio Borg, MD  ondansetron (ZOFRAN ODT) 4 MG disintegrating tablet Take 1 tablet (4 mg total) by mouth every 8 (eight) hours as needed for nausea or vomiting. 05/03/20  Yes Maudie Flakes, MD  ondansetron (ZOFRAN) 4 MG tablet Take 1 tablet (4 mg total) by mouth every 8 (eight) hours as needed for nausea or vomiting. 04/10/20  Yes Prudencio Burly III, PA-C  pantoprazole (PROTONIX) 40 MG tablet TAKE 1 TABLET BY MOUTH EVERY DAY Patient taking differently: Take 40 mg by mouth daily.  03/23/20  Yes Camnitz, Will Hassell Done, MD  potassium chloride SA (KLOR-CON M20) 20 MEQ tablet Take  2 tablets (40 mEq total) by mouth daily. Needs appt for further refills 04/06/20  Yes Larey Dresser, MD  ranolazine (RANEXA) 500 MG 12 hr tablet TAKE 1 TABLET BY MOUTH TWICE A DAY Patient taking differently: Take 500 mg by mouth 2 (two) times daily.  12/21/19  Yes Larey Dresser, MD  rosuvastatin (CRESTOR) 10 MG tablet Take 1 tablet (10 mg total) by mouth daily. 09/07/19  Yes Biagio Borg, MD  VOLTAREN 1 % GEL Apply 1 application topically 2 (two) times daily as needed (Knee pain).  09/14/19  Yes [provider]  baclofen (LIORESAL) 10 MG tablet Take 1 tablet (10 mg total) by mouth 3 (three) times daily as needed for muscle spasms. Patient not taking:  Reported on 05/03/2020 04/10/20   Prudencio Burly III, PA-C  gabapentin (NEURONTIN) 100 MG capsule Take 1 capsule (100 mg total) by mouth 2 (two) times daily for 14 days. For pain. Patient not taking: Reported on 05/03/2020 04/10/20 05/03/29  Prudencio Burly III, PA-C    Current Facility-Administered Medications  Medication Dose Route Frequency Provider Last Rate Last Admin  . 0.9 %  sodium chloride infusion  10 mL/hr Intravenous Once Rise Patience, MD      . baclofen (LIORESAL) tablet 5 mg  5 mg Oral TID PRN Lavina Hamman, MD      . isosorbide mononitrate (IMDUR) 24 hr tablet 30 mg  30 mg Oral Daily Lavina Hamman, MD      . ondansetron [facility]) tablet 4 mg  4 mg Oral Q6H PRN Rise Patience, MD       Or  . ondansetron [facility]) injection 4 mg  4 mg Intravenous Q6H PRN Rise Patience, MD      . pantoprazole (PROTONIX) EC tablet 40 mg  40 mg Oral Daily Lavina Hamman, MD      . ranolazine (RANEXA) 12 hr tablet 500 mg  500 mg Oral BID Lavina Hamman, MD       Current Outpatient Medications  Medication Sig Dispense Refill  . apixaban (ELIQUIS) 5 MG TABS tablet Take 1 tablet (5 mg total) by mouth 2 (two) times daily. (Patient taking differently: Take 5 mg by mouth in the morning, at noon, and at  bedtime. ) 180 tablet 3  . Cholecalciferol (VITAMIN D3) 25 MCG (1000 UT) CAPS Take 1,000 Units by mouth daily.    . furosemide (LASIX) 80 MG tablet TAKE 1 TABLET BY MOUTH EVERY DAY (Patient taking differently: Take 80 mg by mouth daily. ) 90 tablet 3  . isosorbide mononitrate (IMDUR) 30 MG 24 hr tablet TAKE HALF A TABLET BY MOUTH EVERY DAY (Patient taking differently: Take 15 mg by mouth daily. ) 45 tablet 1  . ondansetron (ZOFRAN ODT) 4 MG disintegrating tablet Take 1 tablet (4 mg total) by mouth every 8 (eight) hours as needed for nausea or vomiting. 20 tablet 0  . ondansetron (ZOFRAN) 4 MG tablet Take 1 tablet (4 mg total) by mouth every 8 (eight) hours as needed for nausea or vomiting. 20 tablet 0  . pantoprazole (PROTONIX) 40 MG tablet TAKE 1 TABLET BY MOUTH EVERY DAY (Patient taking differently: Take 40 mg by mouth daily. ) 90 tablet 3  . potassium chloride SA (KLOR-CON M20) 20 MEQ tablet Take 2 tablets (40 mEq total) by mouth daily. Needs appt for further refills 180 tablet 1  . ranolazine (RANEXA) 500 MG 12 hr tablet TAKE 1 TABLET BY MOUTH TWICE A DAY (Patient taking differently: Take 500 mg by mouth 2 (two) times daily. ) 180 tablet 3  . rosuvastatin (CRESTOR) 10 MG tablet Take 1 tablet (10 mg total) by mouth daily. 90 tablet 3  . VOLTAREN 1 % GEL Apply 1 application topically 2 (two) times daily as needed (Knee pain).     . baclofen (LIORESAL) 10 MG tablet Take 1 tablet (10 mg total) by mouth 3 (three) times daily as needed for muscle spasms. (Patient not taking: Reported on 05/03/2020) 20 each 0  . gabapentin (NEURONTIN) 100 MG capsule Take 1 capsule (100 mg total) by mouth 2 (two) times daily for 14 days. For pain. (Patient not taking: Reported on 05/03/2020) 28 capsule 0    Allergies as  of 05/14/2020 - Review Complete 05/14/2020  Allergen Reaction Noted  . Ace inhibitors Palpitations 03/20/2011    Family History  Problem Relation Age of Onset  . Asthma Mother   . Diabetes Father     . Heart disease Father        Died of presumed heart attack - 59s  . Lung disease Sister   . Heart disease Sister        Twin sister has heart issue, unclear what kind  . Thyroid disease Neg Hx   . Colon polyps Neg Hx   . Esophageal cancer Neg Hx   . Pancreatic cancer Neg Hx   . Stomach cancer Neg Hx     Social History   Socioeconomic History  . Marital status: Single    Spouse name: Not on file  . Number of children: 1  . Years of education: Not on file  . Highest education level: Not on file  Occupational History  . Occupation: Academic librarian   Tobacco Use  . Smoking status: Former Smoker    Packs/day: 0.50    Types: Cigarettes    Start date: 2019  . Smokeless tobacco: Never Used  Vaping Use  . Vaping Use: Never used  Substance and Sexual Activity  . Alcohol use: Not Currently    Alcohol/week: 4.0 standard drinks    Types: 4 Cans of beer per week    Comment: Intermittent  . Drug use: No  . Sexual activity: Not on file  Other Topics Concern  . Not on file  Social History Narrative  . Not on file   Social Determinants of Health   Financial Resource Strain:   . Difficulty of Paying Living Expenses:   Food Insecurity:   . Worried About Charity fundraiser in the Last Year:   . Arboriculturist in the Last Year:   Transportation Needs:   . Film/video editor (Medical):   Marland Kitchen Lack of Transportation (Non-Medical):   Physical Activity:   . Days of Exercise per Week:   . Minutes of Exercise per Session:   Stress:   . Feeling of Stress :   Social Connections:   . Frequency of Communication with Friends and Family:   . Frequency of Social Gatherings with Friends and Family:   . Attends Religious Services:   . Active Member of Clubs or Organizations:   . Attends Archivist Meetings:   Marland Kitchen Marital Status:   Intimate Partner Violence:   . Fear of Current or Ex-Partner:   . Emotionally Abused:   Marland Kitchen Physically Abused:   . Sexually Abused:     Review of  Systems: All systems reviewed and negative except where noted in HPI.  Physical Exam: Vital signs in last 24 hours: Temp:  [98.1 F (36.7 C)-99 F (37.2 C)] 98.1 F (36.7 C) (06/22 0830) Pulse Rate:  [32-162] 72 (06/22 0830) Resp:  [12-33] 18 (06/22 0830) BP: (108-173)/(46-100) 145/78 (06/22 0830) SpO2:  [81 %-100 %] 92 % (06/22 0830)   General:   Alert, well-developed,  female in NAD Psych:  Pleasant, cooperative. Normal mood and affect. Eyes:  Pupils equal, sclera clear, no icterus.   Conjunctiva pink. Ears:  Normal auditory acuity. Nose:  No deformity, discharge,  or lesions. Neck:  Supple; no masses Lungs:  Clear throughout to auscultation.   No wheezes, crackles, or rhonchi.  Heart:  Regular rate and rhythm; no murmurs, no lower extremity edema Abdomen:  Soft, non-distended, nontender, BS  active, no palp mass   Rectal: Sitting in a moderate amount of red blood with clots Msk:  Symmetrical without gross deformities. . Neurologic:  Alert and  oriented x4;  grossly normal neurologically. Skin:  Intact without significant lesions or rashes.   Intake/Output from previous day: 06/21 0701 - 06/22 0700 In: 250 [I.V.:250] Out: 400 [Urine:400] Intake/Output this shift: Total I/O In: 315 [Blood:315] Out: -   Lab Results: Recent Labs    05/14/20 2148  WBC 7.5  HGB 8.8*  HCT 28.7*  PLT 323   BMET Recent Labs    05/14/20 2148  NA 142  K 3.7  CL 101  CO2 29  GLUCOSE 92  BUN 45*  CREATININE 2.04*  CALCIUM 9.1   LFT No results for input(s): PROT, ALBUMIN, AST, ALT, ALKPHOS, BILITOT, BILIDIR, IBILI in the last 72 hours. PT/INR Recent Labs    05/14/20 2148  LABPROT 21.5*  INR 1.9*   Hepatitis Panel No results for input(s): HEPBSAG, HCVAB, HEPAIGM, HEPBIGM in the last 72 hours.   . CBC Latest Ref Rng & Units 05/14/2020 05/03/2020 03/30/2020  WBC 4.0 - 10.5 K/uL 7.5 7.2 6.8  Hemoglobin 12.0 - 15.0 g/dL 8.8(L) 11.4(L) 12.1  Hematocrit 36 - 46 % 28.7(L) 38.2  40.1  Platelets 150 - 400 K/uL 323 PLATELET CLUMPS NOTED ON SMEAR, UNABLE TO ESTIMATE 251    . CMP Latest Ref Rng & Units 05/14/2020 05/03/2020 03/30/2020  Glucose 70 - 99 mg/dL 92 112(H) 93  BUN 8 - 23 mg/dL 45(H) 43(H) 28(H)  Creatinine 0.44 - 1.00 mg/dL 2.04(H) 2.35(H) 1.68(H)  Sodium 135 - 145 mmol/L 142 142 139  Potassium 3.5 - 5.1 mmol/L 3.7 4.5 4.2  Chloride 98 - 111 mmol/L 101 106 100  CO2 22 - 32 mmol/L 29 22 29   Calcium 8.9 - 10.3 mg/dL 9.1 9.1 9.5  Total Protein 6.5 - 8.1 g/dL - 7.2 -  Total Bilirubin 0.3 - 1.2 mg/dL - 0.7 -  Alkaline Phos 38 - 126 U/L - 83 -  AST 15 - 41 U/L - 16 -  ALT 0 - 44 U/L - 11 -   Studies/Results: No results found.  Principal Problem:   Acute GI bleeding Active Problems:   NICM (nonischemic cardiomyopathy) (HCC)   Chronic atrial fibrillation   Acute blood loss anemia   Chronic anticoagulation    Tye Savoy, NP-C @  05/15/2020, 9:25 AM

## 2020-05-15 NOTE — H&P (View-Only) (Signed)
Referring Provider:  [facility]         Primary Care Physician:  Maximiano Coss, NP Primary Gastroenterologist:    Lucio Edward, MD           We were asked to see this patient for:   Rectal bleeding             ASSESSMENT /  PLAN    Patricia Wagner is a 69 y.o. female PMH significant for, but not necessarily limited to, Afib on Eliquis, non-ischemic cardiomyopathy, CKD 3, CVA, Raynaud's morbid obesity, diverticulosis / diverticular hemorrhage, adenomatous colon polyps.   # Painless hematochezia on Eliquis, suspect recurrent diverticular hemorrhage.  --She had a flexible sigmoidoscopy with control of bleeding from sigmoid diverticulum in Feb 2021  --blood clots on exam today. Will send for tagged RBC scan.  --Make NPO in case endoscopic evaluation necessary  # Hypoxia, sats in low to upper 80s early this am. --Asymptomatic -- I discussed with [facility].  --Sats now improved to mid to upper 90s on room air  # Afib, on Eliquis --Last dose of Eliquis around 11:30 AM yesterday --Several admissions for recurrent GI bleeding.  Risk of discontinuing anti-coagulant indefinitely?   HPI:    Chief Complaint: rectal bleeding   Patricia Wagner is a 69 y.o. female with a multiple medical problems as listed above and also history of  several admission for GIB on blood thinners.   12/03/17 Admitted with painless hematochezia with supratherapeutic INR. Felt to be a diverticular hemorrhage. Transfused 2 u PRBC.    03/06/18 Admitted with melena and anemia with supratherapeutic INR. Transfused PRBC. Coumadin reversed with Vit K and K centra. No endoscopy done. Changed to Xarelto.   03/16/18 Readmitted with hematochezia. Negative tagged RBC scan. Felt to be diverticular  hemorrhage  01/18/20 Readmitted with hematochezia on Eliquis, felt to be diverticular hemorrhage . RBC scan remarkable for bleeding in LLQ, probably distal sigmoid. Flexible sigmoidoscopy showed active bleeding diverticulum in sigmoid  treated with clip and epi injection. Transfused PRBC.    01/30/20 Hospital follow in office.  Doing well. Hgb had improved.   INTERVAL HISTORY :   Patient presented to ED yesterday with diarrhea with blood. Hgb was initially at baseline of 11.4 but last night declined to 8.8.  Patient says she had a virus last week a couple of weeks ago.Marland Kitchen  Her symptoms mainly included diarrhea, some nausea.  She is not sure if the diarrhea contained blood.  She came to the emergency department on 05/03/2020 for evaluation.  At that time her hemoglobin was 11.4, down from 12 early May of this year.  She had AKI on CKD.  Discharged home with Zofran.  The diarrhea stopped but then at some point (patient is unsure but thinks a couple days ago (she started having loose stools again.  She did notice blood in the stool.  Patient says this episode of bleeding is different than previous diverticular hemorrhages in that she is passing stool this time.  In previous times she was passing just blood with clots.  She has no abdominal pain.  No nausea.  Her last Eliquis was yesterday morning.  Prior to the dose yesterday morning she had not been taking it for a few days, just did not feel well.   PREVIOUS ENDOSCOPIC EVALUATIONS / GI STUDIES :  11/2017 Colonoscopy  --Examined portion of the ileum was normal.   --Diffuse Diverticulosis  01/18/20 Flexible sigmoidoscopy -Diverticulosis in the sigmoid colon with active,  brisk bleeding. This was successfully treated with hemostatic clips x2 and Epinephrine injection with cessation of bleeding. - Blood in the rectum, in the recto-sigmoid colon and in the sigmoid colon. - Non-bleeding internal hemorrhoids. - No specimens collected.  Past Medical History:  Diagnosis Date  . Acute blood loss anemia 03/05/2018  . Acute hypercapnic respiratory failure ([place]) 04/30/2017  . Acute renal failure superimposed on chronic kidney disease ([place]) 10/20/2016  . Arthritis    hands, knees  . Atrial  fibrillation ([place])    a. s/p multiple cardioversions; failed tikosyn/sotalol.  . Bradycardia 11/20/2016  . CAD in native artery, s/p cardiac cath with non obstructive CAD 10/24/2016  . Cerebrovascular accident (CVA) due to embolism of left cerebellar artery ([place])   . CHF (congestive heart failure) ([place])   . Chronic anticoagulation 03/15/2018  . Chronic atrial fibrillation ([place]) 03/05/2018  . Chronic systolic (congestive) heart failure ([place]) 03/05/2018  . CKD (chronic kidney disease) stage 3, GFR 30-59 ml/min   . Coagulopathy ([place]) 03/05/2018  . DIVERTICULITIS, HX OF 07/25/2007  . Diverticulosis of colon with hemorrhage 07/22/2007   Qualifier: Diagnosis of  By: [name]    . DIVERTICULOSIS, COLON 07/22/2007  . Dysuria 07/21/2017  . Edema, peripheral    a. chronic BLE edema, R>L. Prior trauma from dog attack and accident.  . Essential hypertension 07/22/2007   Qualifier: Diagnosis of  By: [name]    . History of kidney stones   . HLD (hyperlipidemia) 02/03/2008   Qualifier: Diagnosis of  By: [name], [name]   . HYPERLIPIDEMIA 02/03/2008  . Hypersomnia    declines w/u  . Hypertension   . Hypotension (arterial) 04/30/2017  . MENOPAUSAL DISORDER 01/09/2011  . Morbid obesity ([place]) 07/22/2007  . NICM (nonischemic cardiomyopathy) ([place]) 10/24/2016  . PAF (paroxysmal atrial fibrillation) ([place])   . Presence of indwelling urinary catheter    supra pubic cath  . Raynaud's syndrome 07/22/2007  . Stroke ([place]) 2017  . Suspected sleep apnea 05/20/2017  . THYROID NODULE, RIGHT 01/04/2010  . VITAMIN D DEFICIENCY 01/09/2011   Qualifier: Diagnosis of  By: [name], [name]     Past Surgical History:  Procedure Laterality Date  . ATRIAL FIBRILLATION ABLATION N/A 10/13/2019   Procedure: ATRIAL FIBRILLATION ABLATION;  Surgeon: [name], MD;  Location: [place] [facility];  Service: Cardiovascular;  Laterality: N/A;  . CARDIAC CATHETERIZATION N/A 10/23/2016   Procedure: Left Heart Cath  and Coronary Angiography;  Surgeon: [name], MD;  Location: [place] [facility];  Service: Cardiovascular;  Laterality: N/A;  . CARDIOVERSION N/A 10/31/2016   Procedure: CARDIOVERSION;  Surgeon: [name], MD;  Location: [place];  Service: Cardiovascular;  Laterality: N/A;  . CARDIOVERSION N/A 11/03/2016   Procedure: CARDIOVERSION;  Surgeon: [name], MD;  Location: [place];  Service: Cardiovascular;  Laterality: N/A;  . CARDIOVERSION N/A 11/18/2016   Procedure: CARDIOVERSION;  Surgeon: [name], MD;  Location: [place];  Service: Cardiovascular;  Laterality: N/A;  . CARDIOVERSION N/A 03/25/2017   Procedure: CARDIOVERSION;  Surgeon: [name], MD;  Location: [facility] ENDOSCOPY;  Service: Cardiovascular;  Laterality: N/A;  . CARDIOVERSION N/A 05/06/2017   Procedure: CARDIOVERSION;  Surgeon: [name], MD;  Location: [facility] ENDOSCOPY;  Service: Cardiovascular;  Laterality: N/A;  . CARDIOVERSION N/A 05/12/2017   Procedure: CARDIOVERSION;  Surgeon: [name], MD;  Location: [place];  Service: Cardiovascular;  Laterality: N/A;  . COLONOSCOPY N/A 12/04/2017   Procedure: COLONOSCOPY;  Surgeon: [name], MD;  Location: [facility] ENDOSCOPY;  Service: Endoscopy;  Laterality: N/A;  . COLONOSCOPY    . COLONOSCOPY W/ POLYPECTOMY  02/2011   pan diverticulosis.  tubular adenoma without dysplasia on 5 mm sigmoid polyp.  Dr Fuller Plan.    Marland Kitchen FLEXIBLE SIGMOIDOSCOPY N/A 01/18/2020   Procedure: FLEXIBLE SIGMOIDOSCOPY;  Surgeon: [name], DO;  Location: [place];  Service: Gastroenterology;  Laterality: N/A;  . HEMOSTASIS CLIP PLACEMENT  01/18/2020   Procedure: HEMOSTASIS CLIP PLACEMENT;  Surgeon: [name], DO;  Location: [facility] ENDOSCOPY;  Service: Gastroenterology;;  . PARTIAL HYSTERECTOMY     1 OVARY LEFT  . SCLEROTHERAPY  01/18/2020   Procedure: SCLEROTHERAPY;  Surgeon: [name], DO;  Location: [place] ENDOSCOPY;  Service:  Gastroenterology;;  . TEE WITHOUT CARDIOVERSION N/A 10/31/2016   Procedure: TRANSESOPHAGEAL ECHOCARDIOGRAM (TEE);  Surgeon: [name], MD;  Location: [facility] ENDOSCOPY;  Service: Cardiovascular;  Laterality: N/A;  . TEE WITHOUT CARDIOVERSION N/A 03/25/2017   Procedure: TRANSESOPHAGEAL ECHOCARDIOGRAM (TEE);  Surgeon: [name], MD;  Location: [facility] ENDOSCOPY;  Service: Cardiovascular;  Laterality: N/A;  . TOTAL KNEE ARTHROPLASTY Right 04/10/2020   Procedure: TOTAL KNEE ARTHROPLASTY;  Surgeon: Renette Butters, MD;  Location: [facility];  Service: Orthopedics;  Laterality: Right;    Prior to Admission medications   Medication Sig Start Date End Date Taking? Authorizing Provider  apixaban (ELIQUIS) 5 MG TABS tablet Take 1 tablet (5 mg total) by mouth 2 (two) times daily. Patient taking differently: Take 5 mg by mouth in the morning, at noon, and at bedtime.  03/23/20  Yes Camnitz, Will Hassell Done, MD  Cholecalciferol (VITAMIN D3) 25 MCG (1000 UT) CAPS Take 1,000 Units by mouth daily.   Yes [provider]  furosemide (LASIX) 80 MG tablet TAKE 1 TABLET BY MOUTH EVERY DAY Patient taking differently: Take 80 mg by mouth daily.  11/09/19  Yes Bensimhon, Shaune Pascal, MD  isosorbide mononitrate (IMDUR) 30 MG 24 hr tablet TAKE HALF A TABLET BY MOUTH EVERY DAY Patient taking differently: Take 15 mg by mouth daily.  09/07/19  Yes Biagio Borg, MD  ondansetron (ZOFRAN ODT) 4 MG disintegrating tablet Take 1 tablet (4 mg total) by mouth every 8 (eight) hours as needed for nausea or vomiting. 05/03/20  Yes Maudie Flakes, MD  ondansetron (ZOFRAN) 4 MG tablet Take 1 tablet (4 mg total) by mouth every 8 (eight) hours as needed for nausea or vomiting. 04/10/20  Yes Prudencio Burly III, PA-C  pantoprazole (PROTONIX) 40 MG tablet TAKE 1 TABLET BY MOUTH EVERY DAY Patient taking differently: Take 40 mg by mouth daily.  03/23/20  Yes Camnitz, Will Hassell Done, MD  potassium chloride SA (KLOR-CON M20) 20 MEQ tablet Take  2 tablets (40 mEq total) by mouth daily. Needs appt for further refills 04/06/20  Yes Larey Dresser, MD  ranolazine (RANEXA) 500 MG 12 hr tablet TAKE 1 TABLET BY MOUTH TWICE A DAY Patient taking differently: Take 500 mg by mouth 2 (two) times daily.  12/21/19  Yes Larey Dresser, MD  rosuvastatin (CRESTOR) 10 MG tablet Take 1 tablet (10 mg total) by mouth daily. 09/07/19  Yes Biagio Borg, MD  VOLTAREN 1 % GEL Apply 1 application topically 2 (two) times daily as needed (Knee pain).  09/14/19  Yes [provider]  baclofen (LIORESAL) 10 MG tablet Take 1 tablet (10 mg total) by mouth 3 (three) times daily as needed for muscle spasms. Patient not taking:  Reported on 05/03/2020 04/10/20   Prudencio Burly III, PA-C  gabapentin (NEURONTIN) 100 MG capsule Take 1 capsule (100 mg total) by mouth 2 (two) times daily for 14 days. For pain. Patient not taking: Reported on 05/03/2020 04/10/20 05/03/29  Prudencio Burly III, PA-C    Current Facility-Administered Medications  Medication Dose Route Frequency Provider Last Rate Last Admin  . 0.9 %  sodium chloride infusion  10 mL/hr Intravenous Once Rise Patience, MD      . baclofen (LIORESAL) tablet 5 mg  5 mg Oral TID PRN Lavina Hamman, MD      . isosorbide mononitrate (IMDUR) 24 hr tablet 30 mg  30 mg Oral Daily Lavina Hamman, MD      . ondansetron [facility]) tablet 4 mg  4 mg Oral Q6H PRN Rise Patience, MD       Or  . ondansetron [facility]) injection 4 mg  4 mg Intravenous Q6H PRN Rise Patience, MD      . pantoprazole (PROTONIX) EC tablet 40 mg  40 mg Oral Daily Lavina Hamman, MD      . ranolazine (RANEXA) 12 hr tablet 500 mg  500 mg Oral BID Lavina Hamman, MD       Current Outpatient Medications  Medication Sig Dispense Refill  . apixaban (ELIQUIS) 5 MG TABS tablet Take 1 tablet (5 mg total) by mouth 2 (two) times daily. (Patient taking differently: Take 5 mg by mouth in the morning, at noon, and at  bedtime. ) 180 tablet 3  . Cholecalciferol (VITAMIN D3) 25 MCG (1000 UT) CAPS Take 1,000 Units by mouth daily.    . furosemide (LASIX) 80 MG tablet TAKE 1 TABLET BY MOUTH EVERY DAY (Patient taking differently: Take 80 mg by mouth daily. ) 90 tablet 3  . isosorbide mononitrate (IMDUR) 30 MG 24 hr tablet TAKE HALF A TABLET BY MOUTH EVERY DAY (Patient taking differently: Take 15 mg by mouth daily. ) 45 tablet 1  . ondansetron (ZOFRAN ODT) 4 MG disintegrating tablet Take 1 tablet (4 mg total) by mouth every 8 (eight) hours as needed for nausea or vomiting. 20 tablet 0  . ondansetron (ZOFRAN) 4 MG tablet Take 1 tablet (4 mg total) by mouth every 8 (eight) hours as needed for nausea or vomiting. 20 tablet 0  . pantoprazole (PROTONIX) 40 MG tablet TAKE 1 TABLET BY MOUTH EVERY DAY (Patient taking differently: Take 40 mg by mouth daily. ) 90 tablet 3  . potassium chloride SA (KLOR-CON M20) 20 MEQ tablet Take 2 tablets (40 mEq total) by mouth daily. Needs appt for further refills 180 tablet 1  . ranolazine (RANEXA) 500 MG 12 hr tablet TAKE 1 TABLET BY MOUTH TWICE A DAY (Patient taking differently: Take 500 mg by mouth 2 (two) times daily. ) 180 tablet 3  . rosuvastatin (CRESTOR) 10 MG tablet Take 1 tablet (10 mg total) by mouth daily. 90 tablet 3  . VOLTAREN 1 % GEL Apply 1 application topically 2 (two) times daily as needed (Knee pain).     . baclofen (LIORESAL) 10 MG tablet Take 1 tablet (10 mg total) by mouth 3 (three) times daily as needed for muscle spasms. (Patient not taking: Reported on 05/03/2020) 20 each 0  . gabapentin (NEURONTIN) 100 MG capsule Take 1 capsule (100 mg total) by mouth 2 (two) times daily for 14 days. For pain. (Patient not taking: Reported on 05/03/2020) 28 capsule 0    Allergies as  of 05/14/2020 - Review Complete 05/14/2020  Allergen Reaction Noted  . Ace inhibitors Palpitations 03/20/2011    Family History  Problem Relation Age of Onset  . Asthma Mother   . Diabetes Father     . Heart disease Father        Died of presumed heart attack - 69s  . Lung disease Sister   . Heart disease Sister        Twin sister has heart issue, unclear what kind  . Thyroid disease Neg Hx   . Colon polyps Neg Hx   . Esophageal cancer Neg Hx   . Pancreatic cancer Neg Hx   . Stomach cancer Neg Hx     Social History   Socioeconomic History  . Marital status: Single    Spouse name: Not on file  . Number of children: 1  . Years of education: Not on file  . Highest education level: Not on file  Occupational History  . Occupation: Academic librarian   Tobacco Use  . Smoking status: Former Smoker    Packs/day: 0.50    Types: Cigarettes    Start date: 2019  . Smokeless tobacco: Never Used  Vaping Use  . Vaping Use: Never used  Substance and Sexual Activity  . Alcohol use: Not Currently    Alcohol/week: 4.0 standard drinks    Types: 4 Cans of beer per week    Comment: Intermittent  . Drug use: No  . Sexual activity: Not on file  Other Topics Concern  . Not on file  Social History Narrative  . Not on file   Social Determinants of Health   Financial Resource Strain:   . Difficulty of Paying Living Expenses:   Food Insecurity:   . Worried About Charity fundraiser in the Last Year:   . Arboriculturist in the Last Year:   Transportation Needs:   . Film/video editor (Medical):   Marland Kitchen Lack of Transportation (Non-Medical):   Physical Activity:   . Days of Exercise per Week:   . Minutes of Exercise per Session:   Stress:   . Feeling of Stress :   Social Connections:   . Frequency of Communication with Friends and Family:   . Frequency of Social Gatherings with Friends and Family:   . Attends Religious Services:   . Active Member of Clubs or Organizations:   . Attends Archivist Meetings:   Marland Kitchen Marital Status:   Intimate Partner Violence:   . Fear of Current or Ex-Partner:   . Emotionally Abused:   Marland Kitchen Physically Abused:   . Sexually Abused:     Review of  Systems: All systems reviewed and negative except where noted in HPI.  Physical Exam: Vital signs in last 24 hours: Temp:  [98.1 F (36.7 C)-99 F (37.2 C)] 98.1 F (36.7 C) (06/22 0830) Pulse Rate:  [32-162] 72 (06/22 0830) Resp:  [12-33] 18 (06/22 0830) BP: (108-173)/(46-100) 145/78 (06/22 0830) SpO2:  [81 %-100 %] 92 % (06/22 0830)   General:   Alert, well-developed,  female in NAD Psych:  Pleasant, cooperative. Normal mood and affect. Eyes:  Pupils equal, sclera clear, no icterus.   Conjunctiva pink. Ears:  Normal auditory acuity. Nose:  No deformity, discharge,  or lesions. Neck:  Supple; no masses Lungs:  Clear throughout to auscultation.   No wheezes, crackles, or rhonchi.  Heart:  Regular rate and rhythm; no murmurs, no lower extremity edema Abdomen:  Soft, non-distended, nontender, BS  active, no palp mass   Rectal: Sitting in a moderate amount of red blood with clots Msk:  Symmetrical without gross deformities. . Neurologic:  Alert and  oriented x4;  grossly normal neurologically. Skin:  Intact without significant lesions or rashes.   Intake/Output from previous day: 06/21 0701 - 06/22 0700 In: 250 [I.V.:250] Out: 400 [Urine:400] Intake/Output this shift: Total I/O In: 315 [Blood:315] Out: -   Lab Results: Recent Labs    05/14/20 2148  WBC 7.5  HGB 8.8*  HCT 28.7*  PLT 323   BMET Recent Labs    05/14/20 2148  NA 142  K 3.7  CL 101  CO2 29  GLUCOSE 92  BUN 45*  CREATININE 2.04*  CALCIUM 9.1   LFT No results for input(s): PROT, ALBUMIN, AST, ALT, ALKPHOS, BILITOT, BILIDIR, IBILI in the last 72 hours. PT/INR Recent Labs    05/14/20 2148  LABPROT 21.5*  INR 1.9*   Hepatitis Panel No results for input(s): HEPBSAG, HCVAB, HEPAIGM, HEPBIGM in the last 72 hours.   . CBC Latest Ref Rng & Units 05/14/2020 05/03/2020 03/30/2020  WBC 4.0 - 10.5 K/uL 7.5 7.2 6.8  Hemoglobin 12.0 - 15.0 g/dL 8.8(L) 11.4(L) 12.1  Hematocrit 36 - 46 % 28.7(L) 38.2  40.1  Platelets 150 - 400 K/uL 323 PLATELET CLUMPS NOTED ON SMEAR, UNABLE TO ESTIMATE 251    . CMP Latest Ref Rng & Units 05/14/2020 05/03/2020 03/30/2020  Glucose 70 - 99 mg/dL 92 112(H) 93  BUN 8 - 23 mg/dL 45(H) 43(H) 28(H)  Creatinine 0.44 - 1.00 mg/dL 2.04(H) 2.35(H) 1.68(H)  Sodium 135 - 145 mmol/L 142 142 139  Potassium 3.5 - 5.1 mmol/L 3.7 4.5 4.2  Chloride 98 - 111 mmol/L 101 106 100  CO2 22 - 32 mmol/L 29 22 29   Calcium 8.9 - 10.3 mg/dL 9.1 9.1 9.5  Total Protein 6.5 - 8.1 g/dL - 7.2 -  Total Bilirubin 0.3 - 1.2 mg/dL - 0.7 -  Alkaline Phos 38 - 126 U/L - 83 -  AST 15 - 41 U/L - 16 -  ALT 0 - 44 U/L - 11 -   Studies/Results: No results found.  Principal Problem:   Acute GI bleeding Active Problems:   NICM (nonischemic cardiomyopathy) (HCC)   Chronic atrial fibrillation   Acute blood loss anemia   Chronic anticoagulation    Tye Savoy, NP-C @  05/15/2020, 9:25 AM

## 2020-05-15 NOTE — ED Notes (Signed)
Pt was given orange juice and crackers.  

## 2020-05-15 NOTE — Progress Notes (Signed)
Hypoglycemic Event  CBG: 67  Treatment: D50 25 mL (12.5 gm)  Symptoms: Hungry and Nervous/irritable  Follow-up CBG: Time: 0026 CBG Result: 124   Possible Reasons for Event: Inadequate meal intake     Sharyn Lull P Audia Amick

## 2020-05-15 NOTE — ED Notes (Signed)
Pt to nuclear medicine.

## 2020-05-15 NOTE — Progress Notes (Signed)
[facility] PLAN OF CARE NOTE Patient: Patricia Wagner YWV:[id number]   PCP: Maximiano Coss, NP DOB: 01/08/1951   DOA: 05/14/2020   DOS: 05/15/2020    Patient was admitted by my colleague Dr. Hal Hope earlier on 05/15/2020. I have reviewed the H&P as well as assessment and plan and agree with the same. Important changes in the plan are listed below.  Plan of care: Principal Problem:   Acute GI bleeding Active Problems:   NICM (nonischemic cardiomyopathy) (HCC)   Chronic atrial fibrillation   Acute blood loss anemia   Chronic anticoagulation Nuclear medicine scan shows evidence of bleeding in the stomach or proximal small bowel area. H&H relatively stable for now. Patient scheduled for EGD tomorrow.   Author: Berle Mull, MD [facility] 05/15/2020 6:40 PM   If 7PM-7AM, please contact night-coverage at [email or web address]

## 2020-05-15 NOTE — H&P (Signed)
History and Physical    Patricia Wagner HRC:[id number] DOB: October 28, 1951 DOA: 05/14/2020  PCP: Maximiano Coss, NP  Patient coming from: Home.  Chief Complaint: Rectal bleeding.  HPI: Patricia Wagner is a 69 y.o. female with history of A. fib nonischemic cardiomyopathy admitted in February 2021 for diverticular bleeding at that time bleeding scan showed left lower quadrant origin and had sigmoidoscopy done and had hemostatic clips were placed at the time diarrhea for the last 10 days had come to the ER about 10 days ago.  By the time patient did not want to be admitted.  Last 2 days patient had large bowel movement which was bloody and patient also was feeling weak.  Patient presents to the ER denies any abdominal pain nausea vomiting.  ED Course: In the ER patient is hemodynamically stable hemoglobin was around 8.8 dropped from 11.410 days ago.  Patient is on Eliquis.  While in the ER patient again had a large bloody bowel movement following which 2 units of PRBC transfusion has been ordered.  On-call gastroenterology Dr. Silverio Decamp was consulted.  Will be seeing patient in consult.  Patient's other labs are remarkable for creatinine of 2.04 Covid test negative.  EKG shows sinus rhythm with LBBB.  Review of Systems: As per HPI, rest all negative.   Past Medical History:  Diagnosis Date  . Acute blood loss anemia 03/05/2018  . Acute hypercapnic respiratory failure ([place]) 04/30/2017  . Acute renal failure superimposed on chronic kidney disease ([place]) 10/20/2016  . Arthritis    hands, knees  . Atrial fibrillation ([place])    a. s/p multiple cardioversions; failed tikosyn/sotalol.  . Bradycardia 11/20/2016  . CAD in native artery, s/p cardiac cath with non obstructive CAD 10/24/2016  . Cerebrovascular accident (CVA) due to embolism of left cerebellar artery ([place])   . CHF (congestive heart failure) ([place])   . Chronic anticoagulation 03/15/2018  . Chronic atrial fibrillation ([place]) 03/05/2018  . Chronic  systolic (congestive) heart failure ([place]) 03/05/2018  . CKD (chronic kidney disease) stage 3, GFR 30-59 ml/min   . Coagulopathy ([place]) 03/05/2018  . DIVERTICULITIS, HX OF 07/25/2007  . Diverticulosis of colon with hemorrhage 07/22/2007   Qualifier: Diagnosis of  By: [name]    . DIVERTICULOSIS, COLON 07/22/2007  . Dysuria 07/21/2017  . Edema, peripheral    a. chronic BLE edema, R>L. Prior trauma from dog attack and accident.  . Essential hypertension 07/22/2007   Qualifier: Diagnosis of  By: [name]    . History of kidney stones   . HLD (hyperlipidemia) 02/03/2008   Qualifier: Diagnosis of  By: [name], [name]   . HYPERLIPIDEMIA 02/03/2008  . Hypersomnia    declines w/u  . Hypertension   . Hypotension (arterial) 04/30/2017  . MENOPAUSAL DISORDER 01/09/2011  . Morbid obesity ([place]) 07/22/2007  . NICM (nonischemic cardiomyopathy) ([place]) 10/24/2016  . PAF (paroxysmal atrial fibrillation) ([place])   . Presence of indwelling urinary catheter    supra pubic cath  . Raynaud's syndrome 07/22/2007  . Stroke ([place]) 2017  . Suspected sleep apnea 05/20/2017  . THYROID NODULE, RIGHT 01/04/2010  . VITAMIN D DEFICIENCY 01/09/2011   Qualifier: Diagnosis of  By: [name], [name]     Past Surgical History:  Procedure Laterality Date  . ATRIAL FIBRILLATION ABLATION N/A 10/13/2019   Procedure: ATRIAL FIBRILLATION ABLATION;  Surgeon: [name], MD;  Location: [place] [facility];  Service: Cardiovascular;  Laterality: N/A;  . CARDIAC CATHETERIZATION N/A 10/23/2016  Procedure: Left Heart Cath and Coronary Angiography;  Surgeon: [name], MD;  Location: [place] [facility];  Service: Cardiovascular;  Laterality: N/A;  . CARDIOVERSION N/A 10/31/2016   Procedure: CARDIOVERSION;  Surgeon: [name], MD;  Location: [place];  Service: Cardiovascular;  Laterality: N/A;  . CARDIOVERSION N/A 11/03/2016   Procedure: CARDIOVERSION;  Surgeon: [name], MD;  Location: [place];   Service: Cardiovascular;  Laterality: N/A;  . CARDIOVERSION N/A 11/18/2016   Procedure: CARDIOVERSION;  Surgeon: [name], MD;  Location: [place];  Service: Cardiovascular;  Laterality: N/A;  . CARDIOVERSION N/A 03/25/2017   Procedure: CARDIOVERSION;  Surgeon: [name], MD;  Location: [facility] ENDOSCOPY;  Service: Cardiovascular;  Laterality: N/A;  . CARDIOVERSION N/A 05/06/2017   Procedure: CARDIOVERSION;  Surgeon: [name], MD;  Location: [facility] ENDOSCOPY;  Service: Cardiovascular;  Laterality: N/A;  . CARDIOVERSION N/A 05/12/2017   Procedure: CARDIOVERSION;  Surgeon: [name], MD;  Location: [facility] ENDOSCOPY;  Service: Cardiovascular;  Laterality: N/A;  . COLONOSCOPY N/A 12/04/2017   Procedure: COLONOSCOPY;  Surgeon: [name], MD;  Location: [place];  Service: Endoscopy;  Laterality: N/A;  . COLONOSCOPY    . COLONOSCOPY W/ POLYPECTOMY  02/2011   pan diverticulosis.  tubular adenoma without dysplasia on 5 mm sigmoid polyp.  Dr Fuller Plan.    Marland Kitchen FLEXIBLE SIGMOIDOSCOPY N/A 01/18/2020   Procedure: FLEXIBLE SIGMOIDOSCOPY;  Surgeon: [name], DO;  Location: [place];  Service: Gastroenterology;  Laterality: N/A;  . HEMOSTASIS CLIP PLACEMENT  01/18/2020   Procedure: HEMOSTASIS CLIP PLACEMENT;  Surgeon: [name], DO;  Location: [facility] ENDOSCOPY;  Service: Gastroenterology;;  . PARTIAL HYSTERECTOMY     1 OVARY LEFT  . SCLEROTHERAPY  01/18/2020   Procedure: SCLEROTHERAPY;  Surgeon: [name], DO;  Location: [place] ENDOSCOPY;  Service: Gastroenterology;;  . TEE WITHOUT CARDIOVERSION N/A 10/31/2016   Procedure: TRANSESOPHAGEAL ECHOCARDIOGRAM (TEE);  Surgeon: [name], MD;  Location: [facility] ENDOSCOPY;  Service: Cardiovascular;  Laterality: N/A;  . TEE WITHOUT CARDIOVERSION N/A 03/25/2017   Procedure: TRANSESOPHAGEAL ECHOCARDIOGRAM (TEE);  Surgeon: [name], MD;  Location: [facility] ENDOSCOPY;  Service: Cardiovascular;  Laterality: N/A;  . TOTAL KNEE  ARTHROPLASTY Right 04/10/2020   Procedure: TOTAL KNEE ARTHROPLASTY;  Surgeon: Renette Butters, MD;  Location: [facility];  Service: Orthopedics;  Laterality: Right;     reports that she has quit smoking. Her smoking use included cigarettes. She started smoking about 2 years ago. She smoked 0.50 packs per day. She has never used smokeless tobacco. She reports previous alcohol use of about 4.0 standard drinks of alcohol per week. She reports that she does not use drugs.  Allergies  Allergen Reactions  . Ace Inhibitors Palpitations    Family History  Problem Relation Age of Onset  . Asthma Mother   . Diabetes Father   . Heart disease Father        Died of presumed heart attack - 61s  . Lung disease Sister   . Heart disease Sister        Twin sister has heart issue, unclear what kind  . Thyroid disease Neg Hx   . Colon polyps Neg Hx   . Esophageal cancer Neg Hx   . Pancreatic cancer Neg Hx   . Stomach cancer Neg Hx     Prior to Admission medications   Medication Sig Start Date End Date Taking? Authorizing Provider  apixaban (ELIQUIS) 5 MG TABS tablet Take 1 tablet (  5 mg total) by mouth 2 (two) times daily. Patient taking differently: Take 5 mg by mouth in the morning, at noon, and at bedtime.  03/23/20  Yes Camnitz, Will Hassell Done, MD  Cholecalciferol (VITAMIN D3) 25 MCG (1000 UT) CAPS Take 1,000 Units by mouth daily.   Yes [provider]  furosemide (LASIX) 80 MG tablet TAKE 1 TABLET BY MOUTH EVERY DAY Patient taking differently: Take 80 mg by mouth daily.  11/09/19  Yes Bensimhon, Shaune Pascal, MD  isosorbide mononitrate (IMDUR) 30 MG 24 hr tablet TAKE HALF A TABLET BY MOUTH EVERY DAY Patient taking differently: Take 15 mg by mouth daily.  09/07/19  Yes Biagio Borg, MD  ondansetron (ZOFRAN ODT) 4 MG disintegrating tablet Take 1 tablet (4 mg total) by mouth every 8 (eight) hours as needed for nausea or vomiting. 05/03/20  Yes Maudie Flakes, MD  ondansetron (ZOFRAN) 4 MG tablet  Take 1 tablet (4 mg total) by mouth every 8 (eight) hours as needed for nausea or vomiting. 04/10/20  Yes Prudencio Burly III, PA-C  pantoprazole (PROTONIX) 40 MG tablet TAKE 1 TABLET BY MOUTH EVERY DAY Patient taking differently: Take 40 mg by mouth daily.  03/23/20  Yes Camnitz, Will Hassell Done, MD  potassium chloride SA (KLOR-CON M20) 20 MEQ tablet Take 2 tablets (40 mEq total) by mouth daily. Needs appt for further refills 04/06/20  Yes Larey Dresser, MD  ranolazine (RANEXA) 500 MG 12 hr tablet TAKE 1 TABLET BY MOUTH TWICE A DAY Patient taking differently: Take 500 mg by mouth 2 (two) times daily.  12/21/19  Yes Larey Dresser, MD  rosuvastatin (CRESTOR) 10 MG tablet Take 1 tablet (10 mg total) by mouth daily. 09/07/19  Yes Biagio Borg, MD  VOLTAREN 1 % GEL Apply 1 application topically 2 (two) times daily as needed (Knee pain).  09/14/19  Yes [provider]  baclofen (LIORESAL) 10 MG tablet Take 1 tablet (10 mg total) by mouth 3 (three) times daily as needed for muscle spasms. Patient not taking: Reported on 05/03/2020 04/10/20   Prudencio Burly III, PA-C  gabapentin (NEURONTIN) 100 MG capsule Take 1 capsule (100 mg total) by mouth 2 (two) times daily for 14 days. For pain. Patient not taking: Reported on 05/03/2020 04/10/20 05/03/29  Prudencio Burly III, PA-C    Physical Exam: Constitutional: Moderately built and nourished. Vitals:   05/14/20 2022 05/14/20 2046 05/14/20 2101 05/14/20 2315  BP: (!) 162/100 (!) 145/79 (!) 143/46 134/66  Pulse: 69  77 76  Resp: 17 17 14 20   Temp:      TempSrc:      SpO2: 94%  96% 93%   Eyes: Anicteric no pallor. ENMT: No discharge from the ear eyes nose or mouth. Neck: No mass felt.  No neck rigidity. Respiratory: No rhonchi or crepitations. Cardiovascular: S1-S2 heard. Abdomen: Soft nontender bowel sounds present. Musculoskeletal: No edema. Skin: No rash. Neurologic: Alert awake oriented to time place and person.   Moves all extremities. Psychiatric: Appears normal.   Labs on Admission: I have personally reviewed following labs and imaging studies  CBC: Recent Labs  Lab 05/14/20 2148  WBC 7.5  NEUTROABS 5.4  HGB 8.8*  HCT 28.7*  MCV 95.7  PLT 737   Basic Metabolic Panel: Recent Labs  Lab 05/14/20 2148  NA 142  K 3.7  CL 101  CO2 29  GLUCOSE 92  BUN 45*  CREATININE 2.04*  CALCIUM 9.1   GFR: Estimated  Creatinine Clearance: 31.7 mL/min (A) (by C-G formula based on SCr of 2.04 mg/dL (H)). Liver Function Tests: No results for input(s): AST, ALT, ALKPHOS, BILITOT, PROT, ALBUMIN in the last 168 hours. No results for input(s): LIPASE, AMYLASE in the last 168 hours. No results for input(s): AMMONIA in the last 168 hours. Coagulation Profile: Recent Labs  Lab 05/14/20 2148  INR 1.9*   Cardiac Enzymes: No results for input(s): CKTOTAL, CKMB, CKMBINDEX, TROPONINI in the last 168 hours. BNP (last 3 results) No results for input(s): PROBNP in the last 8760 hours. HbA1C: No results for input(s): HGBA1C in the last 72 hours. CBG: No results for input(s): GLUCAP in the last 168 hours. Lipid Profile: No results for input(s): CHOL, HDL, LDLCALC, TRIG, CHOLHDL, LDLDIRECT in the last 72 hours. Thyroid Function Tests: No results for input(s): TSH, T4TOTAL, FREET4, T3FREE, THYROIDAB in the last 72 hours. Anemia Panel: No results for input(s): VITAMINB12, FOLATE, FERRITIN, TIBC, IRON, RETICCTPCT in the last 72 hours. Urine analysis:    Component Value Date/Time   COLORURINE YELLOW 03/30/2020 1419   APPEARANCEUR HAZY (A) 03/30/2020 1419   LABSPEC 1.013 03/30/2020 1419   PHURINE 5.0 03/30/2020 1419   GLUCOSEU NEGATIVE 03/30/2020 1419   GLUCOSEU Negative 04/15/2019 1549   HGBUR SMALL (A) 03/30/2020 1419   BILIRUBINUR NEGATIVE 03/30/2020 1419   KETONESUR NEGATIVE 03/30/2020 1419   PROTEINUR NEGATIVE 03/30/2020 1419   UROBILINOGEN 0.2 mg/dL (A) 04/15/2019 1549   NITRITE NEGATIVE  03/30/2020 1419   LEUKOCYTESUR TRACE (A) 03/30/2020 1419   Sepsis Labs: @LABRCNTIP (procalcitonin:4,lacticidven:4) ) Recent Results (from the past 240 hour(s))  SARS Coronavirus 2 by RT PCR (hospital order, performed in [place] hospital lab) Nasopharyngeal Nasopharyngeal Swab     Status: None   Collection Time: 05/14/20  9:18 PM   Specimen: Nasopharyngeal Swab  Result Value Ref Range Status   SARS Coronavirus 2 NEGATIVE NEGATIVE Final    Comment: (NOTE) SARS-CoV-2 target nucleic acids are NOT DETECTED.  The SARS-CoV-2 RNA is generally detectable in upper and lower respiratory specimens during the acute phase of infection. The lowest concentration of SARS-CoV-2 viral copies this assay can detect is 250 copies / mL. A negative result does not preclude SARS-CoV-2 infection and should not be used as the sole basis for treatment or other patient management decisions.  A negative result may occur with improper specimen collection / handling, submission of specimen other than nasopharyngeal swab, presence of viral mutation(s) within the areas targeted by this assay, and inadequate number of viral copies (<250 copies / mL). A negative result must be combined with clinical observations, patient history, and epidemiological information.  Fact Sheet for Patients:   StrictlyIdeas.no  Fact Sheet for Healthcare Providers: BankingDealers.co.za  This test is not yet approved or  cleared by the [place] FDA and has been authorized for detection and/or diagnosis of SARS-CoV-2 by FDA under an Emergency Use Authorization (EUA).  This EUA will remain in effect (meaning this test can be used) for the duration of the COVID-19 declaration under Section 564(b)(1) of the Act, 21 U.S.C. section 360bbb-3(b)(1), unless the authorization is terminated or revoked sooner.  Performed at [facility], [place] [address], [place],  [place] [address]      Radiological Exams on Admission: No results found.  EKG: Independently reviewed.  Normal sinus rhythm LBBB.  Assessment/Plan Principal Problem:   Acute GI bleeding Active Problems:   NICM (nonischemic cardiomyopathy) (HCC)   Chronic atrial fibrillation   Acute blood loss anemia  Chronic anticoagulation    1. Acute GI bleed likely lower GI given the history of diverticular bleed in February 2021 likely source.  On-call gastroenterology Dr. Teodoro Kil was consulted.  2 units of PRBC has been ordered since patient had a further large bloody bowel movement in the ER.  Will hold off Eliquis after discussing with the patient about the risk of falling.  Patient agrees to.  Will need bleeding scan if there is any further episodes. 2. Acute on chronic and disease stage IV likely from blood loss.  Holding diuretics for now due to bleeding.  2 units of PRBC transfusion has been ordered. 3. A. fib presently rate controlled we will hold off Eliquis due to GI bleed see #1. 4. Nonischemic cardiomyopathy diuretics on hold due to GI bleed.  Post to monitor. 5. Acute blood loss anemia follow CBC after transfusion.  Since patient has acute GI bleed in the setting of anticoagulants will need close monitoring and inpatient status.   DVT prophylaxis: SCDs.  Avoiding anticoagulation due to acute GI bleed. Code Status: Full code. Family Communication: Discussed with patient. Disposition Plan: Home. Consults called: Gastroenterology Dr. Silverio Decamp. Admission status: Inpatient.   Rise Patience MD [facility] Pager [phone].  If 7PM-7AM, please contact night-coverage [email or web address] Password [email or web address]  05/15/2020, 1:25 AM

## 2020-05-16 ENCOUNTER — Encounter (HOSPITAL_COMMUNITY): Payer: Self-pay | Admitting: Internal Medicine

## 2020-05-16 ENCOUNTER — Inpatient Hospital Stay (HOSPITAL_COMMUNITY): Payer: Medicare HMO | Admitting: Anesthesiology

## 2020-05-16 ENCOUNTER — Encounter (HOSPITAL_COMMUNITY)
Disposition: A | Discharge status: Home / Self Care | Payer: Self-pay | Source: Home / Self Care | Attending: Internal Medicine

## 2020-05-16 DIAGNOSIS — I428 Other cardiomyopathies: Secondary | ICD-10-CM

## 2020-05-16 DIAGNOSIS — I482 Chronic atrial fibrillation, unspecified: Secondary | ICD-10-CM

## 2020-05-16 DIAGNOSIS — K31811 Angiodysplasia of stomach and duodenum with bleeding: Principal | ICD-10-CM

## 2020-05-16 HISTORY — PX: ESOPHAGOGASTRODUODENOSCOPY (EGD) WITH PROPOFOL: SHX5813

## 2020-05-16 HISTORY — PX: HOT HEMOSTASIS: SHX5433

## 2020-05-16 LAB — CBC
HCT: 30 % — ABNORMAL LOW (ref 36.0–46.0)
Hemoglobin: 9.2 g/dL — ABNORMAL LOW (ref 12.0–15.0)
MCH: 29 pg (ref 26.0–34.0)
MCHC: 30.7 g/dL (ref 30.0–36.0)
MCV: 94.6 fL (ref 80.0–100.0)
Platelets: 251 10*3/uL (ref 150–400)
RBC: 3.17 MIL/uL — ABNORMAL LOW (ref 3.87–5.11)
RDW: 18.6 % — ABNORMAL HIGH (ref 11.5–15.5)
WBC: 6.2 10*3/uL (ref 4.0–10.5)
nRBC: 0 % (ref 0.0–0.2)

## 2020-05-16 LAB — GLUCOSE, CAPILLARY
Glucose-Capillary: 104 mg/dL — ABNORMAL HIGH (ref 70–99)
Glucose-Capillary: 124 mg/dL — ABNORMAL HIGH (ref 70–99)
Glucose-Capillary: 57 mg/dL — ABNORMAL LOW (ref 70–99)
Glucose-Capillary: 72 mg/dL (ref 70–99)
Glucose-Capillary: 76 mg/dL (ref 70–99)
Glucose-Capillary: 80 mg/dL (ref 70–99)
Glucose-Capillary: 96 mg/dL (ref 70–99)

## 2020-05-16 LAB — TYPE AND SCREEN
ABO/RH(D): AB POS
Antibody Screen: NEGATIVE
Unit division: 0
Unit division: 0

## 2020-05-16 LAB — MAGNESIUM: Magnesium: 2.1 mg/dL (ref 1.7–2.4)

## 2020-05-16 LAB — BPAM RBC
Blood Product Expiration Date: 202107062359
Blood Product Expiration Date: 202107062359
ISSUE DATE / TIME: 202106220201
ISSUE DATE / TIME: 202106220441
Unit Type and Rh: 6200
Unit Type and Rh: 6200

## 2020-05-16 LAB — BASIC METABOLIC PANEL
Anion gap: 10 (ref 5–15)
BUN: 29 mg/dL — ABNORMAL HIGH (ref 8–23)
CO2: 27 mmol/L (ref 22–32)
Calcium: 8.2 mg/dL — ABNORMAL LOW (ref 8.9–10.3)
Chloride: 105 mmol/L (ref 98–111)
Creatinine, Ser: 1.4 mg/dL — ABNORMAL HIGH (ref 0.44–1.00)
GFR calc Af Amer: 45 mL/min — ABNORMAL LOW (ref >60)
GFR calc non Af Amer: 39 mL/min — ABNORMAL LOW (ref >60)
Glucose, Bld: 82 mg/dL (ref 70–99)
Potassium: 3.2 mmol/L — ABNORMAL LOW (ref 3.5–5.1)
Sodium: 142 mmol/L (ref 135–145)

## 2020-05-16 SURGERY — ESOPHAGOGASTRODUODENOSCOPY (EGD) WITH PROPOFOL
Anesthesia: Monitor Anesthesia Care

## 2020-05-16 MED ORDER — BOOST / RESOURCE BREEZE PO LIQD CUSTOM
1.0000 | Freq: Three times a day (TID) | ORAL | Status: DC
  Administered 2020-05-16 – 2020-05-17 (×4): 1 via ORAL

## 2020-05-16 MED ORDER — GLUCAGON HCL RDNA (DIAGNOSTIC) 1 MG IJ SOLR
INTRAMUSCULAR | Status: AC
  Filled 2020-05-16: qty 1

## 2020-05-16 MED ORDER — POTASSIUM CHLORIDE CRYS ER 20 MEQ PO TBCR
40.0000 meq | EXTENDED_RELEASE_TABLET | Freq: Once | ORAL | Status: AC
  Administered 2020-05-16: 40 meq via ORAL
  Filled 2020-05-16: qty 2

## 2020-05-16 MED ORDER — GERHARDT'S BUTT CREAM
TOPICAL_CREAM | Freq: Four times a day (QID) | CUTANEOUS | Status: DC
  Filled 2020-05-16: qty 1

## 2020-05-16 MED ORDER — PANTOPRAZOLE SODIUM 40 MG PO TBEC
40.0000 mg | DELAYED_RELEASE_TABLET | Freq: Every day | ORAL | Status: DC
  Administered 2020-05-16 – 2020-05-19 (×3): 40 mg via ORAL
  Filled 2020-05-16 (×4): qty 1

## 2020-05-16 MED ORDER — GLUCAGON HCL RDNA (DIAGNOSTIC) 1 MG IJ SOLR
INTRAMUSCULAR | Status: DC | PRN
Start: 2020-05-16 — End: 2020-05-16
  Administered 2020-05-16: .5 mg via INTRAVENOUS

## 2020-05-16 MED ORDER — PROPOFOL 500 MG/50ML IV EMUL
INTRAVENOUS | Status: DC | PRN
  Administered 2020-05-16: 125 ug/kg/min via INTRAVENOUS

## 2020-05-16 MED ORDER — LACTATED RINGERS IV SOLN: INTRAVENOUS | Status: DC

## 2020-05-16 SURGICAL SUPPLY — 15 items

## 2020-05-16 NOTE — Op Note (Addendum)
[facility] Patient Name: Patricia Wagner Procedure Date: 05/16/2020 MRN: [id number] Attending MD: Milus Banister , MD Date of Birth: Jul 13, 1951 CSN: [id number] Age: 69 Admit Type: Inpatient Procedure:                Upper GI endoscopy Indications:              Hematochezia, Melena Nuc Med bleeding scan                            yesterday "compatible with active GI bleeding from                            either the stomach or proximal small bowel." Providers:                Milus Banister, MD, Carmie End, RN, Corie Chiquito, Technician, Herbie Drape, CRNA Referring MD:              Medicines:                Monitored Anesthesia Care Complications:            No immediate complications. Estimated blood loss:                            None. Estimated Blood Loss:     Estimated blood loss: none. Procedure:                Pre-Anesthesia Assessment:                           - Prior to the procedure, a History and Physical                            was performed, and patient medications and                            allergies were reviewed. The patient's tolerance of                            previous anesthesia was also reviewed. The risks                            and benefits of the procedure and the sedation                            options and risks were discussed with the patient.                            All questions were answered, and informed consent                            was obtained. Prior Anticoagulants: The patient has  taken Eliquis (apixaban), last dose was 2 days                            prior to procedure. ASA Grade Assessment: III - A                            patient with severe systemic disease. After                            reviewing the risks and benefits, the patient was                            deemed in satisfactory condition to undergo the                             procedure.                           After obtaining informed consent, the endoscope was                            passed under direct vision. Throughout the                            procedure, the patient's blood pressure, pulse, and                            oxygen saturations were monitored continuously. The                            [id number]-[id number] ([id number]) Olympus gastroscope was                            introduced through the mouth, and advanced to the                            second part of duodenum. The upper GI endoscopy was                            accomplished without difficulty. The patient                            tolerated the procedure well. Scope In: Scope Out: Findings:      There was a small amount of red blood in the 3rd duodenum segment, 1-2cm       distal to the major papilla. In this region was a small duodenal       diverticulum and some small petechial appearing AVMs. I observed the       duodenal diverticulum orifice for some time and there was no blood       coming from it. The petechial AVMs however were very slowly oozing and       so I treated them with application of APC successfully. See images.      No other potential source of UGI bleeding noted.  The exam was otherwise without abnormality. Impression:               - There was a small amount of red blood in the 3rd                            duodenum segment, 1-2cm distal to the major                            papilla. In this region was a small duodenal                            diverticulum opening and some small petechial                            appearing AVMs. I observed the duodenal                            diverticulum orifice for some time and there was no                            blood coming from it. The petechial AVMs however                            were very slowly oozing and so I treated them with                            application of APC successfully. See images.                            - No other potential source of UGI bleeding noted.                           - The examination was otherwise normal. Moderate Sedation:      Not Applicable - Patient had care per Anesthesia. Recommendation:           - Follow clinically for rebleeding. Will allow her                            to eat and will order repeat CBC for the morning.                            Will stop PPI drip and resume her pre-admission PPI                            once daily orally.                           - Primary service should consider not ever resuming                            blood thinner given her multiple admissions for GI  bleeding on a variety of blood thinners (coumadin,                            xarelto and now eliquis)                           - Continue present medications. Procedure Code(s):        --- Professional ---                           [phone], Esophagogastroduodenoscopy, flexible,                            transoral; with control of bleeding, any method Diagnosis Code(s):        --- Professional ---                           I43.329, Angiodysplasia of stomach and duodenum                            with bleeding                           K92.1, Melena (includes Hematochezia) CPT copyright 2019 American Medical Association. All rights reserved. The codes documented in this report are preliminary and upon coder review may  be revised to meet current compliance requirements. Milus Banister, MD 05/16/2020 12:46:30 PM This report has been signed electronically. Number of Addenda: 0

## 2020-05-16 NOTE — Transfer of Care (Signed)
Immediate Anesthesia Transfer of Care Note  Patient: Patricia Wagner  Procedure(s) Performed: ESOPHAGOGASTRODUODENOSCOPY (EGD) WITH PROPOFOL (N/A )  Patient Location: PACU  Anesthesia Type:MAC  Level of Consciousness: sedated, patient cooperative and responds to stimulation  Airway & Oxygen Therapy: Patient Spontanous Breathing and Patient connected to face mask oxygen  Post-op Assessment: Report given to RN and Post -op Vital signs reviewed and stable  Post vital signs: Reviewed and stable  Last Vitals:  Vitals Value Taken Time  BP    Temp    Pulse 57 05/16/20 1244  Resp 14 05/16/20 1244  SpO2 97 % 05/16/20 1244  Vitals shown include unvalidated device data.  Last Pain:  Vitals:   05/16/20 1105  TempSrc: Oral  PainSc: 0-No pain         Complications: No complications documented.

## 2020-05-16 NOTE — Interval H&P Note (Signed)
History and Physical Interval Note:  05/16/2020 12:16 PM  Patricia Wagner  has presented today for surgery, with the diagnosis of GI bleed, abnormal tagged RBC scan.  The various methods of treatment have been discussed with the patient and family. After consideration of risks, benefits and other options for treatment, the patient has consented to  Procedure(s): ESOPHAGOGASTRODUODENOSCOPY (EGD) WITH PROPOFOL (N/A) as a surgical intervention.  The patient's history has been reviewed, patient examined, no change in status, stable for surgery.  I have reviewed the patient's chart and labs.  Questions were answered to the patient's satisfaction.     Milus Banister

## 2020-05-16 NOTE — Progress Notes (Signed)
Initial Nutrition Assessment  INTERVENTION:   Once diet advanced: Boost Breeze po TID, each supplement provides 250 kcal and 9 grams of protein  NUTRITION DIAGNOSIS:   Inadequate oral intake related to poor appetite, diarrhea as evidenced by per patient/family report.  GOAL:   Patient will meet greater than or equal to 90% of their needs  MONITOR:   PO intake, Supplement acceptance, Labs, Weight trends, I & O's  REASON FOR ASSESSMENT:   Malnutrition Screening Tool    ASSESSMENT:   69 y.o. female with history of A. fib nonischemic cardiomyopathy admitted in February 2021 for diverticular bleeding at that time bleeding scan showed left lower quadrant origin and had sigmoidoscopy done and had hemostatic clips were placed at the time diarrhea for the last 10 days had come to the ER about 10 days ago  Patient in endoscopy for EGD, currently NPO. Per chart review, pt reports not eating for 3 days PTA. Pt began having diarrhea ~5 days PTA. Recommend nutrition supplements once diet is advanced.  Per weight records, pt has lost 35 lbs since 6/10 (15% wt loss x 13 days, significant for time frame).   Medications: Lactated ringers infusion Labs reviewed: CBGs: 76-80  NUTRITION - FOCUSED PHYSICAL EXAM:  Unable to complete.  Diet Order:   Diet Order            Diet NPO time specified Except for: Sips with Meds  Diet effective midnight                 EDUCATION NEEDS:   No education needs have been identified at this time  Skin:  Skin Assessment: Reviewed RN Assessment  Last BM:  PTA  Height:   Ht Readings from Last 1 Encounters:  05/16/20 5\' 5"  (1.651 m)    Weight:   Wt Readings from Last 1 Encounters:  05/16/20 83.8 kg   BMI:  Body mass index is 30.74 kg/m.  Estimated Nutritional Needs:   Kcal:  1600-1800  Protein:  65-80g  Fluid:  1.8L/day   Clayton Bibles, MS, RD, LDN Inpatient Clinical Dietitian Contact information available via Amion

## 2020-05-16 NOTE — Anesthesia Preprocedure Evaluation (Signed)
Anesthesia Evaluation  Patient identified by MRN, date of birth, ID band Patient awake    Reviewed: Allergy & Precautions, NPO status , Patient's Chart, lab work & pertinent test results  History of Anesthesia Complications Negative for: history of anesthetic complications  Airway Mallampati: II  TM Distance: >3 FB Neck ROM: Full    Dental   Pulmonary neg pulmonary ROS, former smoker,    Pulmonary exam normal        Cardiovascular hypertension, +CHF  Normal cardiovascular exam+ dysrhythmias Atrial Fibrillation      Neuro/Psych CVA negative psych ROS   GI/Hepatic negative GI ROS, Neg liver ROS,   Endo/Other  negative endocrine ROS  Renal/GU Renal Insufficiency, CRF and ARFRenal disease  negative genitourinary   Musculoskeletal negative musculoskeletal ROS (+)   Abdominal   Peds  Hematology  (+) Blood dyscrasia, anemia , Eliquis   Anesthesia Other Findings   Reproductive/Obstetrics                            Anesthesia Physical Anesthesia Plan  ASA: III and emergent  Anesthesia Plan: MAC   Post-op Pain Management:    Induction: Intravenous  PONV Risk Score and Plan: 2 and Propofol infusion, TIVA and Treatment may vary due to age or medical condition  Airway Management Planned: Natural Airway, Nasal Cannula and Simple Face Mask  Additional Equipment: None  Intra-op Plan:   Post-operative Plan:   Informed Consent: I have reviewed the patients History and Physical, chart, labs and discussed the procedure including the risks, benefits and alternatives for the proposed anesthesia with the patient or authorized representative who has indicated his/her understanding and acceptance.       Plan Discussed with:   Anesthesia Plan Comments:        Anesthesia Quick Evaluation

## 2020-05-16 NOTE — Progress Notes (Signed)
Hypoglycemic Event  CBG: 57  Treatment: Pt was eating lunch and was also given OJ   Symptoms: irritable, some confusion  Follow-up CBG: Time: 1647 CBG Result: 96  Possible Reasons for Event: Pt was NPO for procedure; diarrhea    Carney Living

## 2020-05-16 NOTE — Progress Notes (Signed)
PROGRESS NOTE  Patricia Wagner LNL:[id number] DOB: [birth year] DOA: 05/14/2020 PCP: Maximiano Coss, NP  HPI/Recap of past 24 hours: HPI from Dr Mila Homer Patricia Wagner is a 69 y.o. female with history of A. fib nonischemic cardiomyopathy admitted in February 2021 for diverticular bleeding at that time bleeding scan showed left lower quadrant origin and had sigmoidoscopy done and had hemostatic clips were placed at the time diarrhea for the last 10 days had come to the ER about 10 days ago, refusing admission. Last 2 days, patient had large bowel movement which was bloody and patient also was feeling weak.  Patient presents to the ER denies any abdominal pain nausea vomiting. In the ER patient is hemodynamically stable hemoglobin was around 8.8 dropped from 11.4, 10 days ago.  Patient is on Eliquis.  While in the ER patient again had a large bloody bowel movement following which 2 units of PRBC transfusion has been ordered.  On-call gastroenterology Dr. Silverio Decamp was consulted. Patient's other labs are remarkable for creatinine of 2.04 Covid test negative.  EKG shows sinus rhythm with LBBB.  Patient admitted for further management.    Today, saw patient after EGD, discussed findings with patient.  Patient still having some melanotic bowel movements, denies any abdominal pain, nausea/vomiting, fever/chills.    Assessment/Plan: Principal Problem:   Acute GI bleeding Active Problems:   NICM (nonischemic cardiomyopathy) (HCC)   Chronic atrial fibrillation   Acute blood loss anemia   Chronic anticoagulation   Acute on chronic upper and lower GI bleed Hx of recurrent diverticular bleeds, 4 episodes since 2019 Acute blood loss anemia Baseline between 9-11, 8.8 on admission Status post 2 units of PRBC, hemoglobin currently stable Tagged RBC scan showed upper tract bleeding EGD showed small amount of blood in the third duodenum segment, duodenal diverticulum, small petechial appearing AVMs slowly  oozing, treated with APC successfully GI on board, continue PPI, recommend to discontinue the use of blood thinners as patient has been on Coumadin, Xarelto, Eliquis with still persistent GI bleed Daily CBC  Acute on chronic CKD stage IV Baseline creatinine around 2.3 AKI likely due to GI bleed, resolving Daily CMP  Hypokalemia Replace as needed  Paroxysmal A. Fib Heart rate controlled As per GI plan to permanently discontinue any form of anticoagulation, DC Eliquis upon discharge  Nonischemic cardiomyopathy/hyperlipidemia Appears euvolemic Last echo 06/2019 showed EF of 45 to 50%, left ventricular diastolic parameters consistent with impaired relaxation Continue Ranexa, Crestor Continue to hold home Lasix due to AKI, Imdur due to soft BP        Malnutrition Type:  Nutrition Problem: Inadequate oral intake Etiology: poor appetite, diarrhea   Malnutrition Characteristics:  Signs/Symptoms: per patient/family report   Nutrition Interventions:  Interventions: Refer to RD note for recommendations    Estimated body mass index is 30.74 kg/m as calculated from the following:   Height as of this encounter: 5\' 5"  (1.651 m).   Weight as of this encounter: 83.8 kg.       Code Status: Full  Family Communication: Plan to discuss with family, none at bedside  Disposition Plan: Status is: Inpatient  Remains inpatient appropriate because:Inpatient level of care appropriate due to severity of illness   Dispo: The patient is from: Home              Anticipated d/c is to: Home              Anticipated d/c date is: 1 day  Patient currently is not medically stable to d/c.    Consultants:  GI  Procedures:  EGD on 05/16/2020  Antimicrobials:  None  DVT prophylaxis: SCD   Objective: Vitals:   05/16/20 1244 05/16/20 1250 05/16/20 1255 05/16/20 1317  BP: 134/86 (!) 123/56 (!) 130/59 121/61  Pulse: (!) 57 64 62 60  Resp: 14 16 14 14   Temp: 99 F  (37.2 C)   98.4 F (36.9 C)  TempSrc: Oral   Oral  SpO2: 97% 96% 94% 94%  Weight:      Height:        Intake/Output Summary (Last 24 hours) at 05/16/2020 1504 Last data filed at 05/16/2020 1330 Gross per 24 hour  Intake 592.59 ml  Output 1350 ml  Net -757.41 ml   Filed Weights   05/15/20 2306 05/16/20 1105  Weight: 83.8 kg 83.8 kg    Exam:  General: NAD   Cardiovascular: S1, S2 present  Respiratory: CTAB  Abdomen: Soft, nontender, nondistended, bowel sounds present  Musculoskeletal: No bilateral pedal edema noted  Skin: Normal  Psychiatry: Normal mood   Data Reviewed: CBC: Recent Labs  Lab 05/14/20 2148 05/15/20 0942 05/15/20 1918 05/16/20 0637  WBC 7.5 6.4  --  6.2  NEUTROABS 5.4  --   --   --   HGB 8.8* 10.3* 9.6* 9.2*  HCT 28.7* 33.4*  --  30.0*  MCV 95.7 92.8  --  94.6  PLT 323 290  --  627   Basic Metabolic Panel: Recent Labs  Lab 05/14/20 2148 05/15/20 0942 05/16/20 0637  NA 142 141 142  K 3.7 3.4* 3.2*  CL 101 102 105  CO2 29 29 27   GLUCOSE 92 114* 82  BUN 45* 39* 29*  CREATININE 2.04* 1.69* 1.40*  CALCIUM 9.1 8.7* 8.2*  MG  --   --  2.1   GFR: Estimated Creatinine Clearance: 42.2 mL/min (A) (by C-G formula based on SCr of 1.4 mg/dL (H)). Liver Function Tests: No results for input(s): AST, ALT, ALKPHOS, BILITOT, PROT, ALBUMIN in the last 168 hours. No results for input(s): LIPASE, AMYLASE in the last 168 hours. No results for input(s): AMMONIA in the last 168 hours. Coagulation Profile: Recent Labs  Lab 05/14/20 2148  INR 1.9*   Cardiac Enzymes: No results for input(s): CKTOTAL, CKMB, CKMBINDEX, TROPONINI in the last 168 hours. BNP (last 3 results) No results for input(s): PROBNP in the last 8760 hours. HbA1C: No results for input(s): HGBA1C in the last 72 hours. CBG: Recent Labs  Lab 05/15/20 2334 05/16/20 0026 05/16/20 0719 05/16/20 0904 05/16/20 1153  GLUCAP 67* 124* 72 76 80   Lipid Profile: No results for  input(s): CHOL, HDL, LDLCALC, TRIG, CHOLHDL, LDLDIRECT in the last 72 hours. Thyroid Function Tests: No results for input(s): TSH, T4TOTAL, FREET4, T3FREE, THYROIDAB in the last 72 hours. Anemia Panel: No results for input(s): VITAMINB12, FOLATE, FERRITIN, TIBC, IRON, RETICCTPCT in the last 72 hours. Urine analysis:    Component Value Date/Time   COLORURINE YELLOW 03/30/2020 1419   APPEARANCEUR HAZY (A) 03/30/2020 1419   LABSPEC 1.013 03/30/2020 1419   PHURINE 5.0 03/30/2020 1419   GLUCOSEU NEGATIVE 03/30/2020 1419   GLUCOSEU Negative 04/15/2019 1549   HGBUR SMALL (A) 03/30/2020 1419   BILIRUBINUR NEGATIVE 03/30/2020 1419   KETONESUR NEGATIVE 03/30/2020 1419   PROTEINUR NEGATIVE 03/30/2020 1419   UROBILINOGEN 0.2 mg/dL (A) 04/15/2019 1549   NITRITE NEGATIVE 03/30/2020 1419   LEUKOCYTESUR TRACE (A) 03/30/2020 1419   Sepsis Labs: @  LABRCNTIP(procalcitonin:4,lacticidven:4)  ) Recent Results (from the past 240 hour(s))  SARS Coronavirus 2 by RT PCR (hospital order, performed in [facility] hospital lab) Nasopharyngeal Nasopharyngeal Swab     Status: None   Collection Time: 05/14/20  9:18 PM   Specimen: Nasopharyngeal Swab  Result Value Ref Range Status   SARS Coronavirus 2 NEGATIVE NEGATIVE Final    Comment: (NOTE) SARS-CoV-2 target nucleic acids are NOT DETECTED.  The SARS-CoV-2 RNA is generally detectable in upper and lower respiratory specimens during the acute phase of infection. The lowest concentration of SARS-CoV-2 viral copies this assay can detect is 250 copies / mL. A negative result does not preclude SARS-CoV-2 infection and should not be used as the sole basis for treatment or other patient management decisions.  A negative result may occur with improper specimen collection / handling, submission of specimen other than nasopharyngeal swab, presence of viral mutation(s) within the areas targeted by this assay, and inadequate number of viral copies (<250 copies / mL).  A negative result must be combined with clinical observations, patient history, and epidemiological information.  Fact Sheet for Patients:   StrictlyIdeas.no  Fact Sheet for Healthcare Providers: BankingDealers.co.za  This test is not yet approved or  cleared by the [place] FDA and has been authorized for detection and/or diagnosis of SARS-CoV-2 by FDA under an Emergency Use Authorization (EUA).  This EUA will remain in effect (meaning this test can be used) for the duration of the COVID-19 declaration under Section 564(b)(1) of the Act, 21 U.S.C. section 360bbb-3(b)(1), unless the authorization is terminated or revoked sooner.  Performed at [facility], [place] [address], [place], [place] [address]       Studies: NM GI Blood Loss  Result Date: 05/15/2020 CLINICAL DATA:  Dark clotty stools since 05/14/2020. EXAM: NUCLEAR MEDICINE GASTROINTESTINAL BLEEDING SCAN TECHNIQUE: Sequential abdominal images were obtained following intravenous administration of Tc-7m labeled red blood cells. RADIOPHARMACEUTICALS:  20.0 mCi Tc-45m pertechnetate in-vitro labeled red cells. COMPARISON:  01/18/20 FINDINGS: The specific findings of active lower GI tract bleed is not identified on today's study. Faint foci of increased radiotracer activity is identified which moves in a circuitous pattern is favored to represent active bleeding from the stomach or proximal small bowel. IMPRESSION: 1. Imaging findings compatible with active GI bleeding from either the stomach or proximal small bowel. 2. The specific findings of a lower GI tract bleed are not identified on today's exam. Electronically Signed   By: Kerby Moors M.D.   On: 05/15/2020 15:36    Scheduled Meds:  Chlorhexidine Gluconate Cloth  6 each Topical Daily   feeding supplement  1 Container Oral TID BM   pantoprazole  40 mg Oral Daily   ranolazine  500 mg Oral BID     Continuous Infusions:  sodium chloride Stopped (05/15/20 0955)     LOS: 2 days     Alma Friendly, MD [facility]  If 7PM-7AM, please contact night-coverage [email or web address] 05/16/2020, 3:04 PM

## 2020-05-17 LAB — CBC
HCT: 29 % — ABNORMAL LOW (ref 36.0–46.0)
HCT: 33.4 % — ABNORMAL LOW (ref 36.0–46.0)
HCT: 32.4 % — ABNORMAL LOW (ref 36.0–46.0)
Hemoglobin: 8.8 g/dL — ABNORMAL LOW (ref 12.0–15.0)
Hemoglobin: 10.1 g/dL — ABNORMAL LOW (ref 12.0–15.0)
Hemoglobin: 9.7 g/dL — ABNORMAL LOW (ref 12.0–15.0)
MCH: 28.8 pg (ref 26.0–34.0)
MCH: 29.2 pg (ref 26.0–34.0)
MCH: 29 pg (ref 26.0–34.0)
MCHC: 30.3 g/dL (ref 30.0–36.0)
MCHC: 30.2 g/dL (ref 30.0–36.0)
MCHC: 29.9 g/dL — ABNORMAL LOW (ref 30.0–36.0)
MCV: 94.8 fL (ref 80.0–100.0)
MCV: 96.5 fL (ref 80.0–100.0)
MCV: 97 fL (ref 80.0–100.0)
Platelets: 265 10*3/uL (ref 150–400)
Platelets: 290 10*3/uL (ref 150–400)
Platelets: 281 10*3/uL (ref 150–400)
RBC: 3.06 MIL/uL — ABNORMAL LOW (ref 3.87–5.11)
RBC: 3.46 MIL/uL — ABNORMAL LOW (ref 3.87–5.11)
RBC: 3.34 MIL/uL — ABNORMAL LOW (ref 3.87–5.11)
RDW: 18.4 % — ABNORMAL HIGH (ref 11.5–15.5)
RDW: 18.5 % — ABNORMAL HIGH (ref 11.5–15.5)
RDW: 18.6 % — ABNORMAL HIGH (ref 11.5–15.5)
WBC: 8 10*3/uL (ref 4.0–10.5)
WBC: 8.9 10*3/uL (ref 4.0–10.5)
WBC: 9.1 10*3/uL (ref 4.0–10.5)
nRBC: 0 % (ref 0.0–0.2)
nRBC: 0 % (ref 0.0–0.2)
nRBC: 0 % (ref 0.0–0.2)

## 2020-05-17 LAB — BASIC METABOLIC PANEL
Anion gap: 5 (ref 5–15)
BUN: 26 mg/dL — ABNORMAL HIGH (ref 8–23)
CO2: 30 mmol/L (ref 22–32)
Calcium: 8.4 mg/dL — ABNORMAL LOW (ref 8.9–10.3)
Chloride: 104 mmol/L (ref 98–111)
Creatinine, Ser: 1.58 mg/dL — ABNORMAL HIGH (ref 0.44–1.00)
GFR calc Af Amer: 39 mL/min — ABNORMAL LOW (ref >60)
GFR calc non Af Amer: 34 mL/min — ABNORMAL LOW (ref >60)
Glucose, Bld: 103 mg/dL — ABNORMAL HIGH (ref 70–99)
Potassium: 3.4 mmol/L — ABNORMAL LOW (ref 3.5–5.1)
Sodium: 139 mmol/L (ref 135–145)

## 2020-05-17 LAB — GLUCOSE, CAPILLARY
Glucose-Capillary: 103 mg/dL — ABNORMAL HIGH (ref 70–99)
Glucose-Capillary: 107 mg/dL — ABNORMAL HIGH (ref 70–99)
Glucose-Capillary: 88 mg/dL (ref 70–99)

## 2020-05-17 MED ORDER — POTASSIUM CHLORIDE CRYS ER 20 MEQ PO TBCR
40.0000 meq | EXTENDED_RELEASE_TABLET | Freq: Once | ORAL | Status: AC
  Administered 2020-05-17: 40 meq via ORAL
  Filled 2020-05-17: qty 2

## 2020-05-17 NOTE — Care Management Important Message (Signed)
Important Message  Patient Details IM Letter given to Dessa Phi RN Case Manager to present to the Patient Name: Patricia Wagner MRN: [id number] Date of Birth: 14-Nov-1951   Medicare Important Message Given:  Yes     Kerin Salen 05/17/2020, 12:01 PM

## 2020-05-17 NOTE — Progress Notes (Addendum)
Progress Note  CC:  Rectal bleeding        ASSESSMENT AND PLAN:   Patricia PORCHIA is a 69 y.o. female PMH significant for, but not necessarily limited to, Afib on Eliquis, non-ischemic cardiomyopathy, CKD 3, CVA, Raynaud's morbid obesity, diverticulosis / diverticular hemorrhage, adenomatous colon polyps  # Painless hematochezia on Eliquis. --Having frequent passage of maroon to black unformed stool. Cannot tell if old blood and overall volume seems low but frequency is concerning for ongoing bleeding. Monitor closely she may need repeat RBC tagged scan or capsule study to help sort out what appears to be ongoing bleeding. --Originally suspected to be a recurrent diverticular hemorrhage but surprisingly RBC tagged scan suggested upper GI source. EGD yesterday >> slow oozing from duodenal AVM treated with APC. --Bleeding stills seems out of proportion to EGD findings so wouldn't rule out recurrent diverticular hemorrhage   --Received 2 uPRBC on 6/22 after hgb declined from 11.4 to 8.8. Hgb improved but now back to 8.8.  --Several admissions for recurrent GI bleeding on different blood thinners.  Risk of discontinuing anti-coagulant indefinitely?     05/16/20 EGD --There was a small amount of red blood in the 3rd duodenum segment, 1-2cm distal to the major papilla. In this region was a small duodenal diverticulum opening and some small petechial appearing AVMs. I observed the duodenal diverticulum orifice for some time and there was no blood coming from it. The petechial AVMs however were very slowly oozing and so I treated them with application of APC successfully. See images. - No other potential source of UGI bleeding noted. - The examination was otherwise normal.   SUBJECTIVE   Feels okay but frequent "oozing" of maroon colored to black stool from rectum.    OBJECTIVE:     Vital signs in last 24 hours: Temp:  [98.2 F (36.8 C)-99 F (37.2 C)] 98.9 F (37.2 C) (06/24  0541) Pulse Rate:  [57-73] 73 (06/24 0541) Resp:  [14-19] 18 (06/24 0541) BP: (118-136)/(56-86) 123/67 (06/24 0541) SpO2:  [90 %-97 %] 90 % (06/24 0541) Weight:  [83.8 kg] 83.8 kg (06/23 1105) Last BM Date: 05/16/20 General:   Alert, in NAD Heart:  Regular rate and rhythm.  No lower extremity edema   Pulm: Normal respiratory effort   Abdomen:  Soft,  nontender, nondistended.  Normal bowel sounds.          Neurologic:  Alert and  oriented,  grossly normal neurologically. Psych:  Pleasant, cooperative.  Normal mood and affect.   Intake/Output from previous day: 06/23 0701 - 06/24 0700 In: 927.7 [P.O.:480; I.V.:447.7] Out: 200 [Urine:200] Intake/Output this shift: No intake/output data recorded.  Lab Results: Recent Labs    05/15/20 0942 05/15/20 0942 05/15/20 1918 05/16/20 0637 05/17/20 0538  WBC 6.4  --   --  6.2 8.0  HGB 10.3*   < > 9.6* 9.2* 8.8*  HCT 33.4*  --   --  30.0* 29.0*  PLT 290  --   --  251 265   < > = values in this interval not displayed.   BMET Recent Labs    05/15/20 0942 05/16/20 0637 05/17/20 0538  NA 141 142 139  K 3.4* 3.2* 3.4*  CL 102 105 104  CO2 29 27 30   GLUCOSE 114* 82 103*  BUN 39* 29* 26*  CREATININE 1.69* 1.40* 1.58*  CALCIUM 8.7* 8.2* 8.4*   LFT No results for input(s): PROT, ALBUMIN, AST, ALT, ALKPHOS, BILITOT, BILIDIR, IBILI  in the last 72 hours. PT/INR Recent Labs    05/14/20 2148  LABPROT 21.5*  INR 1.9*   Hepatitis Panel No results for input(s): HEPBSAG, HCVAB, HEPAIGM, HEPBIGM in the last 72 hours.  NM GI Blood Loss  Result Date: 05/15/2020 CLINICAL DATA:  Dark clotty stools since 05/14/2020. EXAM: NUCLEAR MEDICINE GASTROINTESTINAL BLEEDING SCAN TECHNIQUE: Sequential abdominal images were obtained following intravenous administration of Tc-57m labeled red blood cells. RADIOPHARMACEUTICALS:  20.0 mCi Tc-1m pertechnetate in-vitro labeled red cells. COMPARISON:  01/18/20 FINDINGS: The specific findings of active lower  GI tract bleed is not identified on today's study. Faint foci of increased radiotracer activity is identified which moves in a circuitous pattern is favored to represent active bleeding from the stomach or proximal small bowel. IMPRESSION: 1. Imaging findings compatible with active GI bleeding from either the stomach or proximal small bowel. 2. The specific findings of a lower GI tract bleed are not identified on today's exam. Electronically Signed   By: Kerby Moors M.D.   On: 05/15/2020 15:36    Principal Problem:   Acute GI bleeding Active Problems:   NICM (nonischemic cardiomyopathy) (HCC)   Chronic atrial fibrillation   Acute blood loss anemia   Chronic anticoagulation     LOS: 3 days   Tye Savoy ,NP 05/17/2020, 9:21 AM

## 2020-05-17 NOTE — Progress Notes (Signed)
Patient had a bowel movement this evening.  NT reported stool was brown with no signs of red or black in stool.  Will continue to monitor.

## 2020-05-17 NOTE — Plan of Care (Signed)

## 2020-05-17 NOTE — Anesthesia Postprocedure Evaluation (Signed)
Anesthesia Post Note  Patient: Patricia Wagner  Procedure(s) Performed: ESOPHAGOGASTRODUODENOSCOPY (EGD) WITH PROPOFOL (N/A ) HOT HEMOSTASIS (ARGON PLASMA COAGULATION/BICAP) (N/A )     Patient location during evaluation: Endoscopy Anesthesia Type: MAC Level of consciousness: awake and alert Pain management: pain level controlled Vital Signs Assessment: post-procedure vital signs reviewed and stable Respiratory status: spontaneous breathing, nonlabored ventilation and respiratory function stable Cardiovascular status: blood pressure returned to baseline and stable Postop Assessment: no apparent nausea or vomiting Anesthetic complications: no   No complications documented.  Last Vitals:  Vitals:   05/16/20 2130 05/17/20 0541  BP: 123/64 123/67  Pulse: 73 73  Resp: 18 18  Temp: 36.8 C 37.2 C  SpO2: 96% 90%    Last Pain:  Vitals:   05/17/20 0541  TempSrc: Oral  PainSc:                  Lidia Collum

## 2020-05-17 NOTE — Progress Notes (Signed)
PROGRESS NOTE  [name] MHD:[id number] DOB: [birth year] DOA: 05/14/2020 PCP: Maximiano Coss, NP  HPI/Recap of past 24 hours: HPI from Dr Mila Homer Patricia Wagner is a 69 y.o. female with history of A. fib nonischemic cardiomyopathy admitted in February 2021 for diverticular bleeding at that time bleeding scan showed left lower quadrant origin and had sigmoidoscopy done and had hemostatic clips were placed at the time diarrhea for the last 10 days had come to the ER about 10 days ago, refusing admission. Last 2 days, patient had large bowel movement which was bloody and patient also was feeling weak.  Patient presents to the ER denies any abdominal pain nausea vomiting. In the ER patient is hemodynamically stable hemoglobin was around 8.8 dropped from 11.4, 10 days ago.  Patient is on Eliquis.  While in the ER patient again had a large bloody bowel movement following which 2 units of PRBC transfusion has been ordered.  On-call gastroenterology Dr. Silverio Decamp was consulted. Patient's other labs are remarkable for creatinine of 2.04 Covid test negative.  EKG shows sinus rhythm with LBBB.  Patient admitted for further management.     Today, patient continues to have melanotic stool, most likely passing old blood, denies any abdominal pain, nausea/vomiting, fever/chills, chest pain, shortness of breath.    Assessment/Plan: Principal Problem:   Acute GI bleeding Active Problems:   NICM (nonischemic cardiomyopathy) (HCC)   Chronic atrial fibrillation   Acute blood loss anemia   Chronic anticoagulation   Upper aGI bleed Hx of recurrent diverticular bleeds, 4 episodes since 2019 Acute blood loss anemia Baseline between 9-11, 8.8 on admission Status post 2 units of PRBC, hemoglobin currently stable Tagged RBC scan showed upper tract bleeding EGD showed small amount of blood in the third duodenum segment, duodenal diverticulum, small petechial appearing AVMs slowly oozing, treated with APC  successfully GI on board, continue PPI, recommend to discontinue the use of blood thinners as patient has been on Coumadin, Xarelto, Eliquis with still persistent GI bleed Daily CBC  Acute on chronic CKD stage IV Baseline creatinine around 2.3 AKI likely due to GI bleed, resolving Daily CMP  Hypokalemia Replace as needed  Paroxysmal A. Fib Heart rate controlled As per GI plan to permanently discontinue any form of anticoagulation, DC Eliquis upon discharge, continue to hold Eliquis while inpatient (pt seems a bit reluctant to discontinue anticoagulation)  Nonischemic cardiomyopathy/hyperlipidemia Appears euvolemic Last echo 06/2019 showed EF of 45 to 50%, left ventricular diastolic parameters consistent with impaired relaxation Continue Ranexa, Crestor Continue to hold home Lasix due to AKI, Imdur due to soft BP        Malnutrition Type:  Nutrition Problem: Inadequate oral intake Etiology: poor appetite, diarrhea   Malnutrition Characteristics:  Signs/Symptoms: per patient/family report   Nutrition Interventions:  Interventions: Refer to RD note for recommendations    Estimated body mass index is 30.74 kg/m as calculated from the following:   Height as of this encounter: 5\' 5"  (1.651 m).   Weight as of this encounter: 83.8 kg.       Code Status: Full  Family Communication: Discussed extensively with patient  Disposition Plan: Status is: Inpatient  Remains inpatient appropriate because:Inpatient level of care appropriate due to severity of illness   Dispo: The patient is from: Home              Anticipated d/c is to: Home              Anticipated d/c date is:  1 day              Patient currently is not medically stable to d/c.    Consultants:  GI  Procedures:  EGD on 05/16/2020  Antimicrobials:  None  DVT prophylaxis: SCD   Objective: Vitals:   05/16/20 2130 05/17/20 0541 05/17/20 1240 05/17/20 1352  BP: 123/64 123/67 122/74 130/66    Pulse: 73 73 70 70  Resp: 18 18 18    Temp: 98.2 F (36.8 C) 98.9 F (37.2 C) 99.1 F (37.3 C) 97.9 F (36.6 C)  TempSrc:  Oral Oral Oral  SpO2: 96% 90% 100% 98%  Weight:      Height:        Intake/Output Summary (Last 24 hours) at 05/17/2020 1638 Last data filed at 05/17/2020 1456 Gross per 24 hour  Intake 1087.73 ml  Output 675 ml  Net 412.73 ml   Filed Weights   05/15/20 2306 05/16/20 1105  Weight: 83.8 kg 83.8 kg    Exam:  General: NAD   Cardiovascular: S1, S2 present  Respiratory: CTAB  Abdomen: Soft, nontender, nondistended, bowel sounds present  Musculoskeletal: No bilateral pedal edema noted  Skin: Normal  Psychiatry: Normal mood   Data Reviewed: CBC: Recent Labs  Lab 05/14/20 2148 05/14/20 2148 05/15/20 0942 05/15/20 0942 05/15/20 1918 05/16/20 0637 05/17/20 0538 05/17/20 1039 05/17/20 1208  WBC 7.5   < > 6.4  --   --  6.2 8.0 8.9 9.1  NEUTROABS 5.4  --   --   --   --   --   --   --   --   HGB 8.8*   < > 10.3*   < > 9.6* 9.2* 8.8* 10.1* 9.7*  HCT 28.7*   < > 33.4*  --   --  30.0* 29.0* 33.4* 32.4*  MCV 95.7   < > 92.8  --   --  94.6 94.8 96.5 97.0  PLT 323   < > 290  --   --  251 265 290 281   < > = values in this interval not displayed.   Basic Metabolic Panel: Recent Labs  Lab 05/14/20 2148 05/15/20 0942 05/16/20 0637 05/17/20 0538  NA 142 141 142 139  K 3.7 3.4* 3.2* 3.4*  CL 101 102 105 104  CO2 29 29 27 30   GLUCOSE 92 114* 82 103*  BUN 45* 39* 29* 26*  CREATININE 2.04* 1.69* 1.40* 1.58*  CALCIUM 9.1 8.7* 8.2* 8.4*  MG  --   --  2.1  --    GFR: Estimated Creatinine Clearance: 37.4 mL/min (A) (by C-G formula based on SCr of 1.58 mg/dL (H)). Liver Function Tests: No results for input(s): AST, ALT, ALKPHOS, BILITOT, PROT, ALBUMIN in the last 168 hours. No results for input(s): LIPASE, AMYLASE in the last 168 hours. No results for input(s): AMMONIA in the last 168 hours. Coagulation Profile: Recent Labs  Lab  05/14/20 2148  INR 1.9*   Cardiac Enzymes: No results for input(s): CKTOTAL, CKMB, CKMBINDEX, TROPONINI in the last 168 hours. BNP (last 3 results) No results for input(s): PROBNP in the last 8760 hours. HbA1C: No results for input(s): HGBA1C in the last 72 hours. CBG: Recent Labs  Lab 05/16/20 1647 05/16/20 2127 05/17/20 0000 05/17/20 0748 05/17/20 1622  GLUCAP 96 104* 103* 107* 88   Lipid Profile: No results for input(s): CHOL, HDL, LDLCALC, TRIG, CHOLHDL, LDLDIRECT in the last 72 hours. Thyroid Function Tests: No results for input(s): TSH, T4TOTAL, FREET4,  T3FREE, THYROIDAB in the last 72 hours. Anemia Panel: No results for input(s): VITAMINB12, FOLATE, FERRITIN, TIBC, IRON, RETICCTPCT in the last 72 hours. Urine analysis:    Component Value Date/Time   COLORURINE YELLOW 03/30/2020 1419   APPEARANCEUR HAZY (A) 03/30/2020 1419   LABSPEC 1.013 03/30/2020 1419   PHURINE 5.0 03/30/2020 1419   GLUCOSEU NEGATIVE 03/30/2020 1419   GLUCOSEU Negative 04/15/2019 1549   HGBUR SMALL (A) 03/30/2020 1419   BILIRUBINUR NEGATIVE 03/30/2020 1419   KETONESUR NEGATIVE 03/30/2020 1419   PROTEINUR NEGATIVE 03/30/2020 1419   UROBILINOGEN 0.2 mg/dL (A) 04/15/2019 1549   NITRITE NEGATIVE 03/30/2020 1419   LEUKOCYTESUR TRACE (A) 03/30/2020 1419   Sepsis Labs: @LABRCNTIP (procalcitonin:4,lacticidven:4)  ) Recent Results (from the past 240 hour(s))  SARS Coronavirus 2 by RT PCR (hospital order, performed in [place] hospital lab) Nasopharyngeal Nasopharyngeal Swab     Status: None   Collection Time: 05/14/20  9:18 PM   Specimen: Nasopharyngeal Swab  Result Value Ref Range Status   SARS Coronavirus 2 NEGATIVE NEGATIVE Final    Comment: (NOTE) SARS-CoV-2 target nucleic acids are NOT DETECTED.  The SARS-CoV-2 RNA is generally detectable in upper and lower respiratory specimens during the acute phase of infection. The lowest concentration of SARS-CoV-2 viral copies this assay can  detect is 250 copies / mL. A negative result does not preclude SARS-CoV-2 infection and should not be used as the sole basis for treatment or other patient management decisions.  A negative result may occur with improper specimen collection / handling, submission of specimen other than nasopharyngeal swab, presence of viral mutation(s) within the areas targeted by this assay, and inadequate number of viral copies (<250 copies / mL). A negative result must be combined with clinical observations, patient history, and epidemiological information.  Fact Sheet for Patients:   StrictlyIdeas.no  Fact Sheet for Healthcare Providers: BankingDealers.co.za  This test is not yet approved or  cleared by the [place] FDA and has been authorized for detection and/or diagnosis of SARS-CoV-2 by FDA under an Emergency Use Authorization (EUA).  This EUA will remain in effect (meaning this test can be used) for the duration of the COVID-19 declaration under Section 564(b)(1) of the Act, 21 U.S.C. section 360bbb-3(b)(1), unless the authorization is terminated or revoked sooner.  Performed at [facility], [place] [address], [place], [place] [address]       Studies: No results found.  Scheduled Meds: . Chlorhexidine Gluconate Cloth  6 each Topical Daily  . feeding supplement  1 Container Oral TID BM  . Gerhardt's butt cream   Topical QID  . pantoprazole  40 mg Oral Daily  . ranolazine  500 mg Oral BID    Continuous Infusions: . sodium chloride Stopped (05/15/20 0955)     LOS: 3 days     Alma Friendly, MD [facility]  If 7PM-7AM, please contact night-coverage [email or web address] 05/17/2020, 4:38 PM

## 2020-05-18 ENCOUNTER — Other Ambulatory Visit: Payer: Self-pay

## 2020-05-18 DIAGNOSIS — Z8719 Personal history of other diseases of the digestive system: Secondary | ICD-10-CM

## 2020-05-18 DIAGNOSIS — D62 Acute posthemorrhagic anemia: Secondary | ICD-10-CM

## 2020-05-18 LAB — BASIC METABOLIC PANEL
Anion gap: 5 (ref 5–15)
BUN: 24 mg/dL — ABNORMAL HIGH (ref 8–23)
CO2: 28 mmol/L (ref 22–32)
Calcium: 8.6 mg/dL — ABNORMAL LOW (ref 8.9–10.3)
Chloride: 105 mmol/L (ref 98–111)
Creatinine, Ser: 1.17 mg/dL — ABNORMAL HIGH (ref 0.44–1.00)
GFR calc Af Amer: 56 mL/min — ABNORMAL LOW (ref >60)
GFR calc non Af Amer: 49 mL/min — ABNORMAL LOW (ref >60)
Glucose, Bld: 96 mg/dL (ref 70–99)
Potassium: 4 mmol/L (ref 3.5–5.1)
Sodium: 138 mmol/L (ref 135–145)

## 2020-05-18 LAB — CBC
HCT: 29.7 % — ABNORMAL LOW (ref 36.0–46.0)
Hemoglobin: 8.8 g/dL — ABNORMAL LOW (ref 12.0–15.0)
MCH: 28.8 pg (ref 26.0–34.0)
MCHC: 29.6 g/dL — ABNORMAL LOW (ref 30.0–36.0)
MCV: 97.1 fL (ref 80.0–100.0)
Platelets: 247 10*3/uL (ref 150–400)
RBC: 3.06 MIL/uL — ABNORMAL LOW (ref 3.87–5.11)
RDW: 18.2 % — ABNORMAL HIGH (ref 11.5–15.5)
WBC: 7.6 10*3/uL (ref 4.0–10.5)
nRBC: 0 % (ref 0.0–0.2)

## 2020-05-18 LAB — GLUCOSE, CAPILLARY
Glucose-Capillary: 75 mg/dL (ref 70–99)
Glucose-Capillary: 92 mg/dL (ref 70–99)
Glucose-Capillary: 93 mg/dL (ref 70–99)

## 2020-05-18 LAB — HEMOGLOBIN: Hemoglobin: 10 g/dL — ABNORMAL LOW (ref 12.0–15.0)

## 2020-05-18 NOTE — Evaluation (Signed)
Occupational Therapy Evaluation Patient Details Name: Patricia Wagner MRN: [id number] DOB: [birth year] Today's Date: 05/18/2020    History of Present Illness 69 y/o female with PMH: AF / CVA on chronic Eliquis, CKD, R TKA 5/19, CHF, CAD, recurrent diverticular bleeding, now  admitted with recurrent lower GI bleed, likely due to diverticulosis   Clinical Impression   Patricia Wagner is a 69 year old woman admitted to hospital with GI bleed. On evaluation patient demonstrates ability to perform bed mobility, transfers and ambulation with RW. In regards to ADLs - patient reports no needs and very irritated with therapist presence. Patient demonstrates the physical abilities to perform ADLs. Patient wants to go home and return to outpatient PT at discharge. No OT needs at this time and patient not interested in OT services.    Follow Up Recommendations  No OT follow up    Equipment Recommendations  None recommended by OT    Recommendations for Other Services       Precautions / Restrictions Precautions Precautions: None Precaution Comments: recent R TKA Restrictions Weight Bearing Restrictions: No      Mobility Bed Mobility Overal bed mobility: Needs Assistance Bed Mobility: Supine to Sit;Sit to Supine     Supine to sit: Min guard Sit to supine: Min guard   General bed mobility comments: Increased time to transfer to side of bed but performed without assistance.  Transfers Overall transfer level: Needs assistance Equipment used: Rolling walker (2 wheeled) Transfers: Stand Pivot Transfers Sit to Stand: Min guard Stand pivot transfers: Min guard       General transfer comment: Patient able to perform transfers, stand with RW and ambulate in room. No assistance provided and patient did not want therapist to touch her or stand too close.    Balance Overall balance assessment: No apparent balance deficits (not formally assessed) Sitting-balance support: No upper extremity  supported;Feet supported Sitting balance-Leahy Scale: Good     Standing balance support: Bilateral upper extremity supported;During functional activity Standing balance-Leahy Scale: Poor Standing balance comment: reliant on UEs, unable to perform her own peri-care                           ADL either performed or assessed with clinical judgement   ADL                                         General ADL Comments: Patient did not demonstrate ADLs for therapist due to agitation but reports no difficulties. Patient demonstrated ability to perform mobility in room and stand at sink to rearrange items. When asked if she could donn her own socks patient states "i'm not even gonna answer that."     Vision   Vision Assessment?: No apparent visual deficits     Perception     Praxis      Pertinent Vitals/Pain Pain Assessment: No/denies pain     Hand Dominance Right   Extremity/Trunk Assessment Upper Extremity Assessment Upper Extremity Assessment: Overall WFL for tasks assessed   Lower Extremity Assessment Lower Extremity Assessment: Defer to PT evaluation RLE Deficits / Details: knee flexion grossly 6 to 90 degrees. strength grossly 3 to 3+/5 LLE Deficits / Details: audible crepitus left knee. c/o a "catch". grossly 3+ to 4/5   Cervical / Trunk Assessment Cervical / Trunk Assessment: Normal   Communication Communication Communication:  No difficulties   Cognition Arousal/Alertness: Awake/alert Behavior During Therapy: WFL for tasks assessed/performed Overall Cognitive Status: Within Functional Limits for tasks assessed                                 General Comments: Agitated by therapist questions and requests but did comply.   General Comments       Exercises     Shoulder Instructions      Home Living Family/patient expects to be discharged to:: Private residence Living Arrangements: Alone Available Help at Discharge:  Family;Available PRN/intermittently Type of Home: Apartment Home Access: Stairs to enter Entrance Stairs-Number of Steps: flight of steps   Home Layout: One level               Home Equipment: Walker - 2 wheels;Tub bench;Cane - single point          Prior Functioning/Environment Level of Independence: Independent with assistive device(s)        Comments: amb with RW since TKA in May 2021        OT Problem List:        OT Treatment/Interventions:      OT Goals(Current goals can be found in the care plan section) Acute Rehab OT Goals Patient Stated Goal: home soon, be able to eat OT Goal Formulation: All assessment and education complete, DC therapy  OT Frequency:     Barriers to D/C:            Co-evaluation              AM-PAC OT "6 Clicks" Daily Activity     Outcome Measure Help from another person eating meals?: None Help from another person taking care of personal grooming?: None Help from another person toileting, which includes using toliet, bedpan, or urinal?: None Help from another person bathing (including washing, rinsing, drying)?: None Help from another person to put on and taking off regular upper body clothing?: None Help from another person to put on and taking off regular lower body clothing?: None 6 Click Score: 24   End of Session Equipment Utilized During Treatment: Rolling walker Nurse Communication:  (No OT needs.)  Activity Tolerance: Patient tolerated treatment well Patient left: in bed;with call bell/phone within reach;with SCD's reapplied  OT Visit Diagnosis: Muscle weakness (generalized) (M62.81)                Time: 0768-0881 OT Time Calculation (min): 14 min Charges:  OT General Charges $OT Visit: 1 Visit OT Evaluation $OT Eval Low Complexity: 1 Low  Renny Remer, OTR/L [place]  Office [phone] Pager: [phone]   Lenward Chancellor 05/18/2020, 3:33 PM

## 2020-05-18 NOTE — Progress Notes (Signed)
PROGRESS NOTE  Patricia Wagner NAT:[id number] DOB: 1951/08/09 DOA: 05/14/2020 PCP: Maximiano Coss, NP  HPI/Recap of past 24 hours: HPI from Dr Mila Homer Patricia Wagner is a 69 y.o. female with history of A. fib nonischemic cardiomyopathy admitted in February 2021 for diverticular bleeding at that time bleeding scan showed left lower quadrant origin and had sigmoidoscopy done and had hemostatic clips were placed at the time diarrhea for the last 10 days had come to the ER about 10 days ago, refusing admission. Last 2 days, patient had large bowel movement which was bloody and patient also was feeling weak.  Patient presents to the ER denies any abdominal pain nausea vomiting. In the ER patient is hemodynamically stable hemoglobin was around 8.8 dropped from 11.4, 10 days ago.  Patient is on Eliquis.  While in the ER patient again had a large bloody bowel movement following which 2 units of PRBC transfusion has been ordered.  On-call gastroenterology Dr. Silverio Decamp was consulted. Patient's other labs are remarkable for creatinine of 2.04 Covid test negative.  EKG shows sinus rhythm with LBBB.  Patient admitted for further management.    Today, patient denies any new complaints, no further melanotic stool noted throughout today, last was yesterday evening but was noted to be brown.  Patient denies any abdominal pain, nausea/vomiting, fever/chills.     Assessment/Plan: Principal Problem:   Acute GI bleeding Active Problems:   NICM (nonischemic cardiomyopathy) (HCC)   Chronic atrial fibrillation   Acute blood loss anemia   Chronic anticoagulation   Upper GI bleed Hx of recurrent diverticular bleeds, 4 episodes since 2019 Acute blood loss anemia Baseline between 9-11, 8.8 on admission Status post 2 units of PRBC, hemoglobin currently stable Tagged RBC scan showed upper tract bleeding EGD showed small amount of blood in the third duodenum segment, duodenal diverticulum, small petechial  appearing AVMs slowly oozing, treated with APC successfully GI on board, continue PPI, recommend to discontinue the use of blood thinners as patient has been on Coumadin, Xarelto, Eliquis with still persistent GI bleed Daily CBC  Acute on chronic CKD stage IV Improving Baseline creatinine around 2.3 AKI likely due to GI bleed, resolving Daily CMP  Hypokalemia Replace as needed  Paroxysmal A. Fib Heart rate controlled As per GI plan to permanently discontinue any form of anticoagulation, DC Eliquis upon discharge, continue to hold Eliquis while inpatient (pt seems a bit reluctant to discontinue anticoagulation)  Nonischemic cardiomyopathy/hyperlipidemia Appears euvolemic Last echo 06/2019 showed EF of 45 to 50%, left ventricular diastolic parameters consistent with impaired relaxation Continue Ranexa, Crestor Continue to hold home Lasix due to AKI, Imdur due to soft BP        Malnutrition Type:  Nutrition Problem: Inadequate oral intake Etiology: poor appetite, diarrhea   Malnutrition Characteristics:  Signs/Symptoms: per patient/family report   Nutrition Interventions:  Interventions: Refer to RD note for recommendations    Estimated body mass index is 31.38 kg/m as calculated from the following:   Height as of this encounter: 5\' 5"  (1.651 m).   Weight as of this encounter: 85.5 kg.       Code Status: Full  Family Communication: Discussed extensively with patient  Disposition Plan: Status is: Inpatient  Remains inpatient appropriate because:Inpatient level of care appropriate due to severity of illness   Dispo: The patient is from: Home              Anticipated d/c is to: Home  Anticipated d/c date is: 1 day              Patient currently is not medically stable to d/c.    Consultants:  GI  Procedures:  EGD on 05/16/2020  Antimicrobials:  None  DVT prophylaxis: SCD   Objective: Vitals:   05/17/20 2123 05/18/20 0445  05/18/20 0835 05/18/20 1300  BP: (!) 155/74 118/65  (!) 147/74  Pulse: 70 76  70  Resp: 18 20  18   Temp: 99.4 F (37.4 C) 98.6 F (37 C)  97.9 F (36.6 C)  TempSrc: Oral Oral  Oral  SpO2: 97% 94%  93%  Weight:   85.5 kg   Height:        Intake/Output Summary (Last 24 hours) at 05/18/2020 1551 Last data filed at 05/18/2020 0900 Gross per 24 hour  Intake 600 ml  Output 675 ml  Net -75 ml   Filed Weights   05/15/20 2306 05/16/20 1105 05/18/20 0835  Weight: 83.8 kg 83.8 kg 85.5 kg    Exam:  General: NAD   Cardiovascular: S1, S2 present  Respiratory: CTAB  Abdomen: Soft, nontender, nondistended, bowel sounds present  Musculoskeletal: No bilateral pedal edema noted  Skin: Normal  Psychiatry: Normal mood   Data Reviewed: CBC: Recent Labs  Lab 05/14/20 2148 05/15/20 0942 05/16/20 0637 05/16/20 0637 05/17/20 0538 05/17/20 1039 05/17/20 1208 05/18/20 0524 05/18/20 1059  WBC 7.5   < > 6.2  --  8.0 8.9 9.1 7.6  --   NEUTROABS 5.4  --   --   --   --   --   --   --   --   HGB 8.8*   < > 9.2*   < > 8.8* 10.1* 9.7* 8.8* 10.0*  HCT 28.7*   < > 30.0*  --  29.0* 33.4* 32.4* 29.7*  --   MCV 95.7   < > 94.6  --  94.8 96.5 97.0 97.1  --   PLT 323   < > 251  --  265 290 281 247  --    < > = values in this interval not displayed.   Basic Metabolic Panel: Recent Labs  Lab 05/14/20 2148 05/15/20 0942 05/16/20 0637 05/17/20 0538 05/18/20 0524  NA 142 141 142 139 138  K 3.7 3.4* 3.2* 3.4* 4.0  CL 101 102 105 104 105  CO2 29 29 27 30 28   GLUCOSE 92 114* 82 103* 96  BUN 45* 39* 29* 26* 24*  CREATININE 2.04* 1.69* 1.40* 1.58* 1.17*  CALCIUM 9.1 8.7* 8.2* 8.4* 8.6*  MG  --   --  2.1  --   --    GFR: Estimated Creatinine Clearance: 51.1 mL/min (A) (by C-G formula based on SCr of 1.17 mg/dL (H)). Liver Function Tests: No results for input(s): AST, ALT, ALKPHOS, BILITOT, PROT, ALBUMIN in the last 168 hours. No results for input(s): LIPASE, AMYLASE in the last 168  hours. No results for input(s): AMMONIA in the last 168 hours. Coagulation Profile: Recent Labs  Lab 05/14/20 2148  INR 1.9*   Cardiac Enzymes: No results for input(s): CKTOTAL, CKMB, CKMBINDEX, TROPONINI in the last 168 hours. BNP (last 3 results) No results for input(s): PROBNP in the last 8760 hours. HbA1C: No results for input(s): HGBA1C in the last 72 hours. CBG: Recent Labs  Lab 05/17/20 0000 05/17/20 0748 05/17/20 1622 05/18/20 0007 05/18/20 0825  GLUCAP 103* 107* 88 93 92   Lipid Profile: No results for input(s): CHOL,  HDL, LDLCALC, TRIG, CHOLHDL, LDLDIRECT in the last 72 hours. Thyroid Function Tests: No results for input(s): TSH, T4TOTAL, FREET4, T3FREE, THYROIDAB in the last 72 hours. Anemia Panel: No results for input(s): VITAMINB12, FOLATE, FERRITIN, TIBC, IRON, RETICCTPCT in the last 72 hours. Urine analysis:    Component Value Date/Time   COLORURINE YELLOW 03/30/2020 1419   APPEARANCEUR HAZY (A) 03/30/2020 1419   LABSPEC 1.013 03/30/2020 1419   PHURINE 5.0 03/30/2020 1419   GLUCOSEU NEGATIVE 03/30/2020 1419   GLUCOSEU Negative 04/15/2019 1549   HGBUR SMALL (A) 03/30/2020 1419   BILIRUBINUR NEGATIVE 03/30/2020 1419   KETONESUR NEGATIVE 03/30/2020 1419   PROTEINUR NEGATIVE 03/30/2020 1419   UROBILINOGEN 0.2 mg/dL (A) 04/15/2019 1549   NITRITE NEGATIVE 03/30/2020 1419   LEUKOCYTESUR TRACE (A) 03/30/2020 1419   Sepsis Labs: @LABRCNTIP (procalcitonin:4,lacticidven:4)  ) Recent Results (from the past 240 hour(s))  SARS Coronavirus 2 by RT PCR (hospital order, performed in [place] hospital lab) Nasopharyngeal Nasopharyngeal Swab     Status: None   Collection Time: 05/14/20  9:18 PM   Specimen: Nasopharyngeal Swab  Result Value Ref Range Status   SARS Coronavirus 2 NEGATIVE NEGATIVE Final    Comment: (NOTE) SARS-CoV-2 target nucleic acids are NOT DETECTED.  The SARS-CoV-2 RNA is generally detectable in upper and lower respiratory specimens during  the acute phase of infection. The lowest concentration of SARS-CoV-2 viral copies this assay can detect is 250 copies / mL. A negative result does not preclude SARS-CoV-2 infection and should not be used as the sole basis for treatment or other patient management decisions.  A negative result may occur with improper specimen collection / handling, submission of specimen other than nasopharyngeal swab, presence of viral mutation(s) within the areas targeted by this assay, and inadequate number of viral copies (<250 copies / mL). A negative result must be combined with clinical observations, patient history, and epidemiological information.  Fact Sheet for Patients:   StrictlyIdeas.no  Fact Sheet for Healthcare Providers: BankingDealers.co.za  This test is not yet approved or  cleared by the [place] FDA and has been authorized for detection and/or diagnosis of SARS-CoV-2 by FDA under an Emergency Use Authorization (EUA).  This EUA will remain in effect (meaning this test can be used) for the duration of the COVID-19 declaration under Section 564(b)(1) of the Act, 21 U.S.C. section 360bbb-3(b)(1), unless the authorization is terminated or revoked sooner.  Performed at [facility], [place] [address], [place], [place] [address]       Studies: No results found.  Scheduled Meds: . Chlorhexidine Gluconate Cloth  6 each Topical Daily  . feeding supplement  1 Container Oral TID BM  . Gerhardt's butt cream   Topical QID  . pantoprazole  40 mg Oral Daily  . ranolazine  500 mg Oral BID    Continuous Infusions: . sodium chloride Stopped (05/15/20 0955)     LOS: 4 days     Alma Friendly, MD [facility]  If 7PM-7AM, please contact night-coverage [email or web address] 05/18/2020, 3:51 PM

## 2020-05-18 NOTE — Evaluation (Signed)
Physical Therapy Evaluation Patient Details Name: Patricia Wagner MRN: [id number] DOB: 30-May-1951 Today's Date: 05/18/2020   History of Present Illness  69 y/o female with PMH: AF / CVA on chronic Eliquis, CKD, R TKA 5/19, CHF, CAD, recurrent diverticular bleeding, now  admitted with recurrent lower GI bleed, likely due to diverticulosis  Clinical Impression  Pt admitted with above diagnosis.  Pt reluctantly agreeable to PT, states she amb with OT. Pt with recent R TKA, recommend OPPT as planned at d/c. Encouraged R knee ROM--appears to be progressing well. Will follow in acute setting  Pt currently with functional limitations due to the deficits listed below (see PT Problem List). Pt will benefit from skilled PT to increase their independence and safety with mobility to allow discharge to the venue listed below.       Follow Up Recommendations Outpatient PT (set up for OPPT for recent TKA)    Equipment Recommendations  None recommended by PT    Recommendations for Other Services       Precautions / Restrictions Precautions Precautions: Fall Precaution Comments: recent R TKA Restrictions Weight Bearing Restrictions: No      Mobility  Bed Mobility Overal bed mobility: Needs Assistance Bed Mobility: Supine to Sit;Sit to Supine     Supine to sit: Min guard Sit to supine: Min guard   General bed mobility comments: for safety, incr time  Transfers Overall transfer level: Needs assistance Equipment used: Rolling walker (2 wheeled) Transfers: Stand Pivot Transfers Sit to Stand: Min guard Stand pivot transfers: Min guard       General transfer comment: pt requests no assist, uses momentum to stand, incr time  Ambulation/Gait Ambulation/Gait assistance: Min guard Gait Distance (Feet): 100 Feet Assistive device: Rolling walker (2 wheeled) Gait Pattern/deviations: Step-to pattern;Step-through pattern;Decreased stride length;Trunk flexed     General Gait Details: min/guard  for safety. incr time and effort, slow but steady gait, no LOB  Stairs            Wheelchair Mobility    Modified Rankin (Stroke Patients Only)       Balance Overall balance assessment: Needs assistance Sitting-balance support: No upper extremity supported;Feet supported Sitting balance-Leahy Scale: Good     Standing balance support: Bilateral upper extremity supported;During functional activity Standing balance-Leahy Scale: Poor Standing balance comment: reliant on UEs, unable to perform her own peri-care                             Pertinent Vitals/Pain Pain Assessment: No/denies pain    Home Living Family/patient expects to be discharged to:: Private residence Living Arrangements: Alone Available Help at Discharge: Family;Available PRN/intermittently Type of Home: Apartment Home Access: Stairs to enter   Entrance Stairs-Number of Steps: flight of steps Home Layout: One level Home Equipment: Walker - 2 wheels;Tub bench;Kasandra Knudsen - single point      Prior Function Level of Independence: Independent with assistive device(s)         Comments: amb with RW since TKA in May 2021     Hand Dominance   Dominant Hand: Right    Extremity/Trunk Assessment   Upper Extremity Assessment Upper Extremity Assessment: Defer to OT evaluation    Lower Extremity Assessment Lower Extremity Assessment: LLE deficits/detail;RLE deficits/detail RLE Deficits / Details: knee flexion grossly 6 to 90 degrees. strength grossly 3 to 3+/5 LLE Deficits / Details: audible crepitus left knee. c/o a "catch". grossly 3+ to 4/5  Communication   Communication: No difficulties  Cognition Arousal/Alertness: Awake/alert Behavior During Therapy: WFL for tasks assessed/performed Overall Cognitive Status: Within Functional Limits for tasks assessed                                 General Comments: easily aggravated by PT, agreeable however states she walked  with OT      General Comments      Exercises     Assessment/Plan    PT Assessment Patient needs continued PT services  PT Problem List Decreased strength;Decreased mobility;Decreased activity tolerance;Decreased balance;Decreased knowledge of use of DME;Pain       PT Treatment Interventions DME instruction;Therapeutic exercise;Gait training;Functional mobility training;Therapeutic activities;Patient/family education    PT Goals (Current goals can be found in the Care Plan section)  Acute Rehab PT Goals Patient Stated Goal: home soon, be able to eat PT Goal Formulation: With patient Time For Goal Achievement: 06/01/20 Potential to Achieve Goals: Good    Frequency Min 3X/week   Barriers to discharge        Co-evaluation               AM-PAC PT "6 Clicks" Mobility  Outcome Measure Help needed turning from your back to your side while in a flat bed without using bedrails?: None Help needed moving from lying on your back to sitting on the side of a flat bed without using bedrails?: None Help needed moving to and from a bed to a chair (including a wheelchair)?: A Little Help needed standing up from a chair using your arms (e.g., wheelchair or bedside chair)?: A Little Help needed to walk in hospital room?: A Little Help needed climbing 3-5 steps with a railing? : A Little 6 Click Score: 20    End of Session   Activity Tolerance: Patient tolerated treatment well Patient left: in bed;with call bell/phone within reach;with bed alarm set   PT Visit Diagnosis: Difficulty in walking, not elsewhere classified (R26.2);Unsteadiness on feet (R26.81)    Time: 3559-7416 PT Time Calculation (min) (ACUTE ONLY): 30 min   Charges:   PT Evaluation $PT Eval Low Complexity: 1 Low PT Treatments $Gait Training: 8-22 mins        Baxter Flattery, PT  Acute Rehab Dept ([place]) [phone] Pager [phone]  05/18/2020   [facility] 05/18/2020, 3:19 PM

## 2020-05-18 NOTE — Progress Notes (Addendum)
Progress Note   Subjective  Patient doing better this AM. Last BM overnight did not have any dark or red blood, was brown. No pain.    Objective   Vital signs in last 24 hours: Temp:  [97.9 F (36.6 C)-99.4 F (37.4 C)] 98.6 F (37 C) (06/25 0445) Pulse Rate:  [70-76] 76 (06/25 0445) Resp:  [18-20] 20 (06/25 0445) BP: (118-155)/(65-74) 118/65 (06/25 0445) SpO2:  [94 %-100 %] 94 % (06/25 0445) Last BM Date: 05/17/20 General:    AA female in NAD Heart:  Regular rate and rhythm;  Lungs: Respirations even and unlabored Abdomen:  Soft, nontender and nondistended Neurologic:  Alert and oriented,  grossly normal neurologically. Psych:  Cooperative. Normal mood and affect.  Intake/Output from previous day: 06/24 0701 - 06/25 0700 In: 960 [P.O.:960] Out: 1350 [Urine:1350] Intake/Output this shift: No intake/output data recorded.  Lab Results: Recent Labs    05/17/20 1039 05/17/20 1208 05/18/20 0524  WBC 8.9 9.1 7.6  HGB 10.1* 9.7* 8.8*  HCT 33.4* 32.4* 29.7*  PLT 290 281 247   BMET Recent Labs    05/16/20 0637 05/17/20 0538 05/18/20 0524  NA 142 139 138  K 3.2* 3.4* 4.0  CL 105 104 105  CO2 27 30 28   GLUCOSE 82 103* 96  BUN 29* 26* 24*  CREATININE 1.40* 1.58* 1.17*  CALCIUM 8.2* 8.4* 8.6*   LFT No results for input(s): PROT, ALBUMIN, AST, ALT, ALKPHOS, BILITOT, BILIDIR, IBILI in the last 72 hours. PT/INR No results for input(s): LABPROT, INR in the last 72 hours.  Studies/Results: No results found.     Assessment / Plan:   69 y/o female with AF / CVA on chronic Eliquis, CKD, recurrent diverticular bleeding, admitted for recurrent bleeding. Nuclear tagged RBC scan on admission suprisingly suggested upper tract bleed. EGD 6/23 with Dr. Ardis Hughs showed a small amount of red blood in the 3rd portion of the duodenum near a small diverticulum and some small petichial AVMs which were treated with APC.   Yesterday her Hgb fluctuated, 8.8 then repeat was  10.1 and 9.7. She passed some dark stool yesterday, thought to have been old blood. Overnight stools have cleared, no more blood. Hgb down back to 8.8 this AM. Will keep on clears this AM and repeat a Hgb around 11 AM. If she continues to have downtrend may consider repeat EGD, but if stable or uptrending will let her eat and monitor as her stools have cleared.   The patient has not tolerated anticoagulation with numerous admissions for GI bleeds. However she has a history of AF with CVA and at risk for recurrent CVA, difficult situation. She will need to discuss long term plan regarding anticoagulation with her PCP. If she had clear recurrent diverticular bleed in the left colon could consider resection of left colon to prevent recurrence on anticoagulation, however it appears she had upper GI bleed on this admission (diverticular?)  Will reassess her later today. Would keep NPO after 9 AM in case repeat endoscopy is needed today.   Enc[place] Cellar, MD [facility]   UPDATE: Hgb repeated and returned at 10.0. Stable, no further bleeding symptoms. I advanced her to regular diet. Discussed timing of discharge, I think okay to go home tonight if she wishes although she is anxious about this and would prefer to go in the AM. I would hold her anticoagulation for a week or so, and she should follow up with her primary provider who  prescribes the anticoagulant to discuss long term plans. She is at risk for recurrent bleeding on anticoagulation, however also at risk for CVA again if she is off it, difficult situation. She understands this, inquires about low dose anticoagulation which may be an option but again recommend she discuss this with her PCP.  She should have her Hgb checked as outpatient next week to ensure stable, our office will coordinate. We will sign off for now.  [place] Cellar, MD [facility]

## 2020-05-19 LAB — GLUCOSE, CAPILLARY
Glucose-Capillary: 141 mg/dL — ABNORMAL HIGH (ref 70–99)
Glucose-Capillary: 89 mg/dL (ref 70–99)

## 2020-05-19 NOTE — Discharge Summary (Signed)
Discharge Summary  Patricia Wagner MVE:[id number] DOB: 1951/06/23  PCP: Maximiano Coss, NP  Admit date: 05/14/2020 Discharge date: 05/19/2020  Time spent: 40 mins  Recommendations for Outpatient Follow-up:  1. Follow-up with PCP in 1 week 2. Follow-up with GI as scheduled    Discharge Diagnoses:  Active Hospital Problems   Diagnosis Date Noted  . Acute GI bleeding 05/14/2020  . Chronic anticoagulation 03/15/2018  . Acute blood loss anemia 03/05/2018  . Chronic atrial fibrillation 03/05/2018  . NICM (nonischemic cardiomyopathy) ([place]) 10/24/2016    Resolved Hospital Problems  No resolved problems to display.    Discharge Condition: Stable  Diet recommendation: As tolerated  Vitals:   05/18/20 2112 05/19/20 0504  BP: 117/69 121/62  Pulse: 75 72  Resp: 18 17  Temp: 99.7 F (37.6 C) 98.7 F (37.1 C)  SpO2: 97% 97%    History of present illness:  Patricia Wagner a 69 y.o.femalewithhistory of A. fib nonischemic cardiomyopathy admitted in February 2021 for diverticular bleeding at that time bleeding scan showed left lower quadrant origin and had sigmoidoscopy done and had hemostatic clips were placed at the time diarrhea for the last 10 days had come to the ER about 10 days ago, refusing admission. Last 2 days, patient had large bowel movement which was bloody and patient also was feeling weak. Patient presents to the ER denies any abdominal pain nausea vomiting. In the ER patient is hemodynamically stable hemoglobin was around 8.8 dropped from 11.4, 10 days ago. Patient is on Eliquis. While in the ER patient again had a large bloody bowel movement following which 2 units of PRBC transfusion has been ordered. On-call gastroenterology Dr. Silverio Decamp was consulted. Patient's other labs are remarkable for creatinine of 2.04 Covid test negative. EKG shows sinus rhythm with LBBB.  Patient admitted for further management.    Today, patient denies any new complaints, no further  melanotic stool noted but yesterday and today, denies any abdominal pain, nausea/vomiting, fever/chills, chest pain, shortness of breath. Patient stable for discharge home to follow-up with PCP with repeat labs and GI as scheduled.   Hospital Course:  Principal Problem:   Acute GI bleeding Active Problems:   NICM (nonischemic cardiomyopathy) ([facility])   Chronic atrial fibrillation   Acute blood loss anemia   Chronic anticoagulation   Upper GI bleed Hx of recurrent diverticular bleeds, 4 episodes since 2019 Acute blood loss anemia Baseline between 9-11, 8.8 on admission Status post 2 units of PRBC, hemoglobin currently stable Tagged RBC scan showed upper tract bleeding EGD showed small amount of blood in the third duodenum segment, duodenal diverticulum, small petechial appearing AVMs slowly oozing, treated with APC successfully GI on board, continue PPI, recommend to hold eliquis for about 1 week until seen by PCP to discuss benefit/risk given pt is at high risk for stroke due to Afib as well as high risk for GI bleeds Follow-up with PCP in 1 week with repeat labs, GI as scheduled  Acute on chronic CKD stage IV Improved Baseline creatinine around 2.3 Follow-up with PCP  Hypokalemia Replaced as needed  Paroxysmal A. Fib Heart rate controlled Hold Eliquis for at least 1 week as mentioned above  Nonischemic cardiomyopathy/hyperlipidemia Appears euvolemic Last echo 06/2019 showed EF of 45 to 50%, left ventricular diastolic parameters consistent with impaired relaxation Continue PTA Ranexa, Crestor, Lasix, Imdur Follow-up with cardiology as scheduled       Malnutrition Type:  Nutrition Problem: Inadequate oral intake Etiology: poor appetite, diarrhea   Malnutrition  Characteristics:  Signs/Symptoms: per patient/family report   Nutrition Interventions:  Interventions: Refer to RD note for recommendations   Estimated body mass index is 31.38 kg/m as calculated  from the following:   Height as of this encounter: 5\' 5"  (1.651 m).   Weight as of this encounter: 85.5 kg.    Procedures:  EGD on 05/16/2020  Consultations:  GI  Discharge Exam: BP 121/62 (BP Location: Right Arm)   Pulse 72   Temp 98.7 F (37.1 C)   Resp 17   Ht 5\' 5"  (1.651 m)   Wt 85.5 kg   SpO2 97%   BMI 31.38 kg/m   General: NAD Cardiovascular: S1, S2 present Respiratory: CTA B Abdomen: Soft, nontender, nondistended, bowel sounds present    Discharge Instructions You were cared for by a hospitalist during your hospital stay. If you have any questions about your discharge medications or the care you received while you were in the hospital after you are discharged, you can call the unit and asked to speak with the hospitalist on call if the hospitalist that took care of you is not available. Once you are discharged, your primary care physician will handle any further medical issues. Please note that NO REFILLS for any discharge medications will be authorized once you are discharged, as it is imperative that you return to your primary care physician (or establish a relationship with a primary care physician if you do not have one) for your aftercare needs so that they can reassess your need for medications and monitor your lab values.  Discharge Instructions    Diet - low sodium heart healthy   Complete by: As directed    Increase activity slowly   Complete by: As directed      Allergies as of 05/19/2020      Reactions   Ace Inhibitors Palpitations      Medication List    STOP taking these medications   apixaban 5 MG Tabs tablet Commonly known as: Eliquis   baclofen 10 MG tablet Commonly known as: LIORESAL   gabapentin 100 MG capsule Commonly known as: Neurontin     TAKE these medications   furosemide 80 MG tablet Commonly known as: LASIX TAKE 1 TABLET BY MOUTH EVERY DAY   isosorbide mononitrate 30 MG 24 hr tablet Commonly known as: IMDUR TAKE HALF A  TABLET BY MOUTH EVERY DAY What changed:   how much to take  how to take this  when to take this  additional instructions   ondansetron 4 MG disintegrating tablet Commonly known as: Zofran ODT Take 1 tablet (4 mg total) by mouth every 8 (eight) hours as needed for nausea or vomiting.   ondansetron 4 MG tablet Commonly known as: Zofran Take 1 tablet (4 mg total) by mouth every 8 (eight) hours as needed for nausea or vomiting.   pantoprazole 40 MG tablet Commonly known as: PROTONIX TAKE 1 TABLET BY MOUTH EVERY DAY   potassium chloride SA 20 MEQ tablet Commonly known as: Klor-Con M20 Take 2 tablets (40 mEq total) by mouth daily. Needs appt for further refills   ranolazine 500 MG 12 hr tablet Commonly known as: RANEXA TAKE 1 TABLET BY MOUTH TWICE A DAY   rosuvastatin 10 MG tablet Commonly known as: CRESTOR Take 1 tablet (10 mg total) by mouth daily.   Vitamin D3 25 MCG (1000 UT) Caps Take 1,000 Units by mouth daily.   Voltaren 1 % Gel Generic drug: diclofenac Sodium Apply 1 application topically  2 (two) times daily as needed (Knee pain).      Allergies  Allergen Reactions  . Ace Inhibitors Palpitations    Follow-up Information    Maximiano Coss, NP. Schedule an appointment as soon as possible for a visit in 1 week(s).   Specialty: Adult Health Nurse Practitioner Contact information: [place] [address] [phone]        [name], MD .   Specialty: Cardiology Contact information: [address] [place] [address] [phone]        Bensimhon, Shaune Pascal, MD .   Specialty: Cardiology Contact information: [address] [place] [address] [phone]                The results of significant diagnostics from this hospitalization (including imaging, microbiology, ancillary and laboratory) are listed below for reference.    Significant Diagnostic Studies: NM GI Blood  Loss  Result Date: 05/15/2020 CLINICAL DATA:  Dark clotty stools since 05/14/2020. EXAM: NUCLEAR MEDICINE GASTROINTESTINAL BLEEDING SCAN TECHNIQUE: Sequential abdominal images were obtained following intravenous administration of Tc-65m labeled red blood cells. RADIOPHARMACEUTICALS:  20.0 mCi Tc-64m pertechnetate in-vitro labeled red cells. COMPARISON:  01/18/20 FINDINGS: The specific findings of active lower GI tract bleed is not identified on today's study. Faint foci of increased radiotracer activity is identified which moves in a circuitous pattern is favored to represent active bleeding from the stomach or proximal small bowel. IMPRESSION: 1. Imaging findings compatible with active GI bleeding from either the stomach or proximal small bowel. 2. The specific findings of a lower GI tract bleed are not identified on today's exam. Electronically Signed   By: Kerby Moors M.D.   On: 05/15/2020 15:36    Microbiology: Recent Results (from the past 240 hour(s))  SARS Coronavirus 2 by RT PCR (hospital order, performed in [facility] hospital lab) Nasopharyngeal Nasopharyngeal Swab     Status: None   Collection Time: 05/14/20  9:18 PM   Specimen: Nasopharyngeal Swab  Result Value Ref Range Status   SARS Coronavirus 2 NEGATIVE NEGATIVE Final    Comment: (NOTE) SARS-CoV-2 target nucleic acids are NOT DETECTED.  The SARS-CoV-2 RNA is generally detectable in upper and lower respiratory specimens during the acute phase of infection. The lowest concentration of SARS-CoV-2 viral copies this assay can detect is 250 copies / mL. A negative result does not preclude SARS-CoV-2 infection and should not be used as the sole basis for treatment or other patient management decisions.  A negative result may occur with improper specimen collection / handling, submission of specimen other than nasopharyngeal swab, presence of viral mutation(s) within the areas targeted by this assay, and inadequate number of viral  copies (<250 copies / mL). A negative result must be combined with clinical observations, patient history, and epidemiological information.  Fact Sheet for Patients:   StrictlyIdeas.no  Fact Sheet for Healthcare Providers: BankingDealers.co.za  This test is not yet approved or  cleared by the [place] FDA and has been authorized for detection and/or diagnosis of SARS-CoV-2 by FDA under an Emergency Use Authorization (EUA).  This EUA will remain in effect (meaning this test can be used) for the duration of the COVID-19 declaration under Section 564(b)(1) of the Act, 21 U.S.C. section 360bbb-3(b)(1), unless the authorization is terminated or revoked sooner.  Performed at [facility], [place] [address], [place], [place] [address]      Labs: Basic Metabolic Panel: Recent Labs  Lab  05/14/20 2148 05/15/20 0942 05/16/20 0637 05/17/20 0538 05/18/20 0524  NA 142 141 142 139 138  K 3.7 3.4* 3.2* 3.4* 4.0  CL 101 102 105 104 105  CO2 29 29 27 30 28   GLUCOSE 92 114* 82 103* 96  BUN 45* 39* 29* 26* 24*  CREATININE 2.04* 1.69* 1.40* 1.58* 1.17*  CALCIUM 9.1 8.7* 8.2* 8.4* 8.6*  MG  --   --  2.1  --   --    Liver Function Tests: No results for input(s): AST, ALT, ALKPHOS, BILITOT, PROT, ALBUMIN in the last 168 hours. No results for input(s): LIPASE, AMYLASE in the last 168 hours. No results for input(s): AMMONIA in the last 168 hours. CBC: Recent Labs  Lab 05/14/20 2148 05/15/20 0942 05/16/20 0637 05/16/20 0637 05/17/20 0538 05/17/20 1039 05/17/20 1208 05/18/20 0524 05/18/20 1059  WBC 7.5   < > 6.2  --  8.0 8.9 9.1 7.6  --   NEUTROABS 5.4  --   --   --   --   --   --   --   --   HGB 8.8*   < > 9.2*   < > 8.8* 10.1* 9.7* 8.8* 10.0*  HCT 28.7*   < > 30.0*  --  29.0* 33.4* 32.4* 29.7*  --   MCV 95.7   < > 94.6  --  94.8 96.5 97.0 97.1  --   PLT 323   < > 251  --  265 290 281 247  --    < > = values in  this interval not displayed.   Cardiac Enzymes: No results for input(s): CKTOTAL, CKMB, CKMBINDEX, TROPONINI in the last 168 hours. BNP: BNP (last 3 results) No results for input(s): BNP in the last 8760 hours.  ProBNP (last 3 results) No results for input(s): PROBNP in the last 8760 hours.  CBG: Recent Labs  Lab 05/18/20 0007 05/18/20 0825 05/18/20 1630 05/19/20 0013 05/19/20 0734  GLUCAP 93 92 75 141* 89       Signed:  Alma Friendly, MD [facility] 05/19/2020, 1:06 PM

## 2020-05-21 ENCOUNTER — Other Ambulatory Visit: Payer: Self-pay

## 2020-05-21 NOTE — Patient Outreach (Signed)
[facility]) Care Management  05/21/2020  JAISA DEFINO Jan 23, 1951 [id number]  Referral Date: 05/21/20 Referral Source: [employer] Report Date of Discharge: 05/19/20 Facility: [place]: [facility]   Referral received.  No outreach warranted at this time.  Transition of Care calls being completed via EMMI. RN CM will outreach patient for any red flags received.    Plan: RN CM will close case.    Jone Baseman, RN, MSN [facility] Care Management Care Management Coordinator Direct Line [phone] Toll Free: [phone]  Fax: [phone]

## 2020-05-23 ENCOUNTER — Other Ambulatory Visit: Payer: Self-pay | Admitting: Internal Medicine

## 2020-05-23 NOTE — Telephone Encounter (Signed)
Please refill as per office routine med refill policy (all routine meds refilled for 3 mo or monthly per pt preference up to one year from last visit, then month to month grace period for 3 mo, then further med refills will have to be denied)  

## 2020-05-23 NOTE — Telephone Encounter (Signed)
Is this ok to fill. It was filled by another doctor and last seen by you in March

## 2020-05-23 NOTE — Telephone Encounter (Signed)
What is the name of the medication Protonix 40 Mg  Have you contacted your pharmacy to request a refill yes Which pharmacy would you like this sent to [id number] [place], [place] Phone:  [phone]  Fax:  [phone]       Patient notified that their request is being sent to the clinical staff for review and that they should receive a call once it is complete. If they do not receive a call within 72 hours they can check with their pharmacy or our office.

## 2020-05-24 ENCOUNTER — Other Ambulatory Visit: Payer: Self-pay | Admitting: Registered Nurse

## 2020-05-24 NOTE — Telephone Encounter (Signed)
Medication Refill - Medication: pantoprazole (PROTONIX) 40 MG tablet    Preferred Pharmacy (with phone number or street name):  [id number] - [place], [place] Phone:  [phone]  Fax:  [phone]       Agent: Please be advised that RX refills may take up to 3 business days. We ask that you follow-up with your pharmacy.

## 2020-05-25 ENCOUNTER — Telehealth: Payer: Self-pay

## 2020-05-25 NOTE — Telephone Encounter (Signed)
-----   Message from Yevette Edwards, RN sent at 05/18/2020  4:44 PM EDT ----- Regarding: FW: Lab  ----- Message ----- From: Yevette Edwards, RN Sent: 05/18/2020   4:38 PM EDT To: Yevette Edwards, RN Subject: Lab                                            Repeat CBC, order in epic

## 2020-05-25 NOTE — Telephone Encounter (Signed)
Spoke with patient regarding lab reminder, pt aware that we will be closed Monday. Pt will come next week to have labs drawn to recheck Hemoglobin.

## 2020-05-29 ENCOUNTER — Ambulatory Visit (INDEPENDENT_AMBULATORY_CARE_PROVIDER_SITE_OTHER): Payer: Medicare HMO | Admitting: Registered Nurse

## 2020-05-29 ENCOUNTER — Encounter: Payer: Self-pay | Admitting: Registered Nurse

## 2020-05-29 ENCOUNTER — Other Ambulatory Visit: Payer: Self-pay

## 2020-05-29 VITALS — BP 101/66 | HR 65 | Temp 98.0°F | Resp 17 | Ht 65.0 in | Wt 186.2 lb

## 2020-05-29 DIAGNOSIS — D649 Anemia, unspecified: Secondary | ICD-10-CM | POA: Diagnosis not present

## 2020-05-29 DIAGNOSIS — K922 Gastrointestinal hemorrhage, unspecified: Secondary | ICD-10-CM | POA: Diagnosis not present

## 2020-05-29 NOTE — Patient Instructions (Signed)
° ° ° °  If you have lab work done today you will be contacted with your lab results within the next 2 weeks.  If you have not heard from us then please contact us. The fastest way to get your results is to register for My Chart. ° ° °IF you received an x-ray today, you will receive an invoice from [facility]. Please contact [facility] at [phone] with questions or concerns regarding your invoice.  ° °IF you received labwork today, you will receive an invoice from [facility]. Please contact [facility] at [phone] with questions or concerns regarding your invoice.  ° °Our billing staff will not be able to assist you with questions regarding bills from these companies. ° °You will be contacted with the lab results as soon as they are available. The fastest way to get your results is to activate your My Chart account. Instructions are located on the last page of this paperwork. If you have not heard from us regarding the results in 2 weeks, please contact this office. °  ° ° ° °

## 2020-05-30 ENCOUNTER — Encounter: Payer: Self-pay | Admitting: Registered Nurse

## 2020-05-30 LAB — BASIC METABOLIC PANEL
BUN/Creatinine Ratio: 14 (ref 12–28)
BUN: 22 mg/dL (ref 8–27)
CO2: 25 mmol/L (ref 20–29)
Calcium: 9.3 mg/dL (ref 8.7–10.3)
Chloride: 101 mmol/L (ref 96–106)
Creatinine, Ser: 1.59 mg/dL — ABNORMAL HIGH (ref 0.57–1.00)
GFR calc Af Amer: 39 mL/min/{1.73_m2} — ABNORMAL LOW (ref >59)
GFR calc non Af Amer: 34 mL/min/{1.73_m2} — ABNORMAL LOW (ref >59)
Glucose: 88 mg/dL (ref 65–99)
Potassium: 4 mmol/L (ref 3.5–5.2)
Sodium: 142 mmol/L (ref 134–144)

## 2020-05-30 LAB — CBC
Hematocrit: 33.9 % — ABNORMAL LOW (ref 34.0–46.6)
Hemoglobin: 10.5 g/dL — ABNORMAL LOW (ref 11.1–15.9)
MCH: 28.4 pg (ref 26.6–33.0)
MCHC: 31 g/dL — ABNORMAL LOW (ref 31.5–35.7)
MCV: 92 fL (ref 79–97)
Platelets: 301 10*3/uL (ref 150–450)
RBC: 3.7 x10E6/uL — ABNORMAL LOW (ref 3.77–5.28)
RDW: 14.4 % (ref 11.7–15.4)
WBC: 5.9 10*3/uL (ref 3.4–10.8)

## 2020-05-30 LAB — PROTIME-INR
INR: 1 (ref 0.9–1.2)
Prothrombin Time: 10.3 s (ref 9.1–12.0)

## 2020-05-31 ENCOUNTER — Other Ambulatory Visit (INDEPENDENT_AMBULATORY_CARE_PROVIDER_SITE_OTHER): Payer: Medicare HMO

## 2020-05-31 DIAGNOSIS — Z8719 Personal history of other diseases of the digestive system: Secondary | ICD-10-CM

## 2020-05-31 DIAGNOSIS — R262 Difficulty in walking, not elsewhere classified: Secondary | ICD-10-CM | POA: Diagnosis not present

## 2020-05-31 DIAGNOSIS — M25561 Pain in right knee: Secondary | ICD-10-CM | POA: Diagnosis not present

## 2020-05-31 DIAGNOSIS — D62 Acute posthemorrhagic anemia: Secondary | ICD-10-CM

## 2020-05-31 DIAGNOSIS — M25661 Stiffness of right knee, not elsewhere classified: Secondary | ICD-10-CM | POA: Diagnosis not present

## 2020-05-31 DIAGNOSIS — M6281 Muscle weakness (generalized): Secondary | ICD-10-CM | POA: Diagnosis not present

## 2020-05-31 DIAGNOSIS — R338 Other retention of urine: Secondary | ICD-10-CM | POA: Diagnosis not present

## 2020-05-31 LAB — CBC
HCT: 32.4 % — ABNORMAL LOW (ref 36.0–46.0)
Hemoglobin: 10.4 g/dL — ABNORMAL LOW (ref 12.0–15.0)
MCHC: 32 g/dL (ref 30.0–36.0)
MCV: 91.1 fl (ref 78.0–100.0)
Platelets: 322 10*3/uL (ref 150.0–400.0)
RBC: 3.56 Mil/uL — ABNORMAL LOW (ref 3.87–5.11)
RDW: 16.8 % — ABNORMAL HIGH (ref 11.5–15.5)
WBC: 6.7 10*3/uL (ref 4.0–10.5)

## 2020-06-05 DIAGNOSIS — M6281 Muscle weakness (generalized): Secondary | ICD-10-CM | POA: Diagnosis not present

## 2020-06-05 DIAGNOSIS — R262 Difficulty in walking, not elsewhere classified: Secondary | ICD-10-CM | POA: Diagnosis not present

## 2020-06-05 DIAGNOSIS — M25561 Pain in right knee: Secondary | ICD-10-CM | POA: Diagnosis not present

## 2020-06-05 DIAGNOSIS — M25661 Stiffness of right knee, not elsewhere classified: Secondary | ICD-10-CM | POA: Diagnosis not present

## 2020-06-07 NOTE — Progress Notes (Signed)
Office Visit Note  Patient: Patricia Wagner             Date of Birth: 06/16/1951           MRN: [id number]             PCP: Maximiano Coss, NP Referring: Maximiano Coss, NP Visit Date: 06/19/2020 Occupation: @GUAROCC @  Subjective:  Pain in both hands.   History of Present Illness: Patricia Wagner is a 69 y.o. female seen in consultation per request of her PCP for evaluation of positive ANA.  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  She has had arthritis in her knee joints and hands for several years.  She has had right total knee replacement by Dr. Percell Miller in May 2019.  Her left knee joint also needs replacement.  She has noticed some knots on her elbows.  She states in the last 6 months she has experienced increased pain and swelling in her hands.  She also has discomfort in her right shoulder and has difficulty lifting her right arm.  She recently had labs done by her PCP and was found to has positive ANA and for that  reason she was referred to me.  Activities of Daily Living:  Patient reports morning stiffness for  several minutes.   Patient Reports nocturnal pain.  Difficulty dressing/grooming: Denies Difficulty climbing stairs: Reports Difficulty getting out of chair: Reports Difficulty using hands for taps, buttons, cutlery, and/or writing: Reports  Review of Systems  Constitutional: Positive for fatigue. Negative for night sweats, weight gain and weight loss.  HENT: Negative for mouth sores, trouble swallowing, trouble swallowing, mouth dryness and nose dryness.   Eyes: Negative for pain, redness, itching, visual disturbance and dryness.  Respiratory: Negative for cough, shortness of breath and difficulty breathing.   Cardiovascular: Negative for chest pain, palpitations, hypertension, irregular heartbeat and swelling in legs/feet.  Gastrointestinal: Negative for blood in stool, constipation and diarrhea.  Endocrine: Positive for cold  intolerance. Negative for increased urination.  Genitourinary: Negative for pelvic pain and vaginal dryness.  Musculoskeletal: Positive for arthralgias, joint pain, joint swelling, myalgias, morning stiffness, muscle tenderness and myalgias. Negative for muscle weakness.  Skin: Negative for color change, rash, hair loss, redness, skin tightness, ulcers and sensitivity to sunlight.  Allergic/Immunologic: Negative for susceptible to infections.  Neurological: Negative for dizziness, numbness, headaches, memory loss, night sweats and weakness.  Hematological: Negative for bruising/bleeding tendency and swollen glands.  Psychiatric/Behavioral: Negative for depressed mood, confusion and sleep disturbance. The patient is not nervous/anxious.     PMFS History:  Patient Active Problem List   Diagnosis Date Noted  . Acute GI bleeding 05/14/2020  . Primary osteoarthritis of right knee 03/20/2020  . History of lower GI bleeding 03/20/2020  . Acute lower GI bleeding 01/18/2020  . Acquired thrombophilia ([place]) 11/14/2019  . Persistent atrial fibrillation ([place]) 10/13/2019  . Allergic rhinitis 09/10/2019  . Left ear pain 09/10/2019  . Vitamin D deficiency 04/15/2019  . Cardiogenic shock ([place])   . Acute on chronic respiratory failure with hypoxia ([place])   . Elevated troponin 08/05/2018  . Hyperbilirubinemia 08/05/2018  . Abnormal transaminases 08/05/2018  . Dysarthria 08/05/2018  . Lower GI bleed 03/15/2018  . Chronic anticoagulation 03/15/2018  . Rectal bleed   . Acute renal failure superimposed on stage 4 chronic kidney disease ([place]) 03/05/2018  . Coagulopathy ([place]) 03/05/2018  . Chronic systolic (congestive) heart failure ([place]) 03/05/2018  . Chronic  atrial fibrillation 03/05/2018  . Acute blood loss anemia 03/05/2018  . Diverticulosis 03/05/2018  . Heme positive stool 03/05/2018  . Urine abnormality 02/02/2018  . Cough 02/02/2018  . Bacteremia due to Gram-negative bacteria 12/30/2017  .  Increased anion gap metabolic acidosis 44/11/270  . Metabolic alkalosis 53/66/4403  . Acute UTI 12/29/2017  . Obstructive uropathy   . Anemia 12/17/2017  . AKI (acute kidney injury) ([place]) 11/30/2017  . Diarrhea 11/30/2017  . Urinary retention 11/23/2017  . Lower abdominal pain 11/23/2017  . Dysuria 07/21/2017  . Suspected sleep apnea 05/20/2017  . Pressure injury of skin 05/02/2017  . Acute respiratory failure with hypoxia ([place]) 04/30/2017  . Hypotension (arterial) 04/30/2017  . Bradycardia 11/20/2016  . Chronic combined systolic (congestive) and diastolic (congestive) heart failure ([place]) 11/20/2016  . Cerebrovascular accident (CVA) due to embolism of left cerebellar artery ([place])   . PAF (paroxysmal atrial fibrillation) ([place])   . CHF (congestive heart failure) ([place]) 10/31/2016  . CAD in native artery, s/p cardiac cath with non obstructive CAD 10/24/2016  . NICM (nonischemic cardiomyopathy) ([place]) 10/24/2016  . Tobacco abuse 10/20/2016  . Acute renal failure superimposed on chronic kidney disease ([place]) 10/20/2016  . Hematochezia 06/29/2013  . Degenerative arthritis of left knee 03/15/2012  . Encounter for well adult exam with abnormal findings 03/12/2012  . Hypersomnia   . MENOPAUSAL DISORDER 01/09/2011  . THYROID NODULE, RIGHT 01/04/2010  . HLD (hyperlipidemia) 02/03/2008  . LEG CRAMPS, NOCTURNAL 02/03/2008  . LOW BACK PAIN 07/25/2007  . Morbid obesity ([place]) 07/22/2007  . Essential hypertension 07/22/2007  . Raynaud's syndrome 07/22/2007  . Diverticulosis of colon with hemorrhage 07/22/2007  . SYMPTOM, EDEMA 07/22/2007    Past Medical History:  Diagnosis Date  . Acute blood loss anemia 03/05/2018  . Acute hypercapnic respiratory failure ([place]) 04/30/2017  . Acute renal failure superimposed on chronic kidney disease ([place]) 10/20/2016  . Arthritis    hands, knees  . Atrial fibrillation ([place])    a. s/p multiple cardioversions; failed tikosyn/sotalol.  . Bradycardia 11/20/2016    . CAD in native artery, s/p cardiac cath with non obstructive CAD 10/24/2016  . Cerebrovascular accident (CVA) due to embolism of left cerebellar artery ([place])   . CHF (congestive heart failure) ([place])   . Chronic anticoagulation 03/15/2018  . Chronic atrial fibrillation ([place]) 03/05/2018  . Chronic systolic (congestive) heart failure ([place]) 03/05/2018  . CKD (chronic kidney disease) stage 3, GFR 30-59 ml/min   . Coagulopathy ([place]) 03/05/2018  . DIVERTICULITIS, HX OF 07/25/2007  . Diverticulosis of colon with hemorrhage 07/22/2007   Qualifier: Diagnosis of  By: [name]    . DIVERTICULOSIS, COLON 07/22/2007  . Dysuria 07/21/2017  . Edema, peripheral    a. chronic BLE edema, R>L. Prior trauma from dog attack and accident.  . Essential hypertension 07/22/2007   Qualifier: Diagnosis of  By: [name]    . History of kidney stones   . HLD (hyperlipidemia) 02/03/2008   Qualifier: Diagnosis of  By: [name], [name]   . HYPERLIPIDEMIA 02/03/2008  . Hypersomnia    declines w/u  . Hypertension   . Hypotension (arterial) 04/30/2017  . MENOPAUSAL DISORDER 01/09/2011  . Morbid obesity ([place]) 07/22/2007  . NICM (nonischemic cardiomyopathy) ([place]) 10/24/2016  . PAF (paroxysmal atrial fibrillation) ([place])   . Presence of indwelling urinary catheter    supra pubic cath  . Raynaud's syndrome 07/22/2007  . Stroke ([place]) 2017  . Suspected sleep apnea 05/20/2017  . THYROID NODULE, RIGHT  01/04/2010  . VITAMIN D DEFICIENCY 01/09/2011   Qualifier: Diagnosis of  By: [name], [name]     Family History  Problem Relation Age of Onset  . Asthma Mother   . Diabetes Father   . Heart disease Father        Died of presumed heart attack - 74s  . Lung disease Sister   . Heart disease Sister        Twin sister has heart issue, unclear what kind  . Healthy Daughter   . Thyroid disease Neg Hx   . Colon polyps Neg Hx   . Esophageal cancer Neg Hx   . Pancreatic cancer Neg Hx   . Stomach cancer Neg Hx    Past  Surgical History:  Procedure Laterality Date  . ATRIAL FIBRILLATION ABLATION N/A 10/13/2019   Procedure: ATRIAL FIBRILLATION ABLATION;  Surgeon: [name], MD;  Location: [facility];  Service: Cardiovascular;  Laterality: N/A;  . CARDIAC CATHETERIZATION N/A 10/23/2016   Procedure: Left Heart Cath and Coronary Angiography;  Surgeon: [name], MD;  Location: [facility];  Service: Cardiovascular;  Laterality: N/A;  . CARDIOVERSION N/A 10/31/2016   Procedure: CARDIOVERSION;  Surgeon: [name], MD;  Location: [facility];  Service: Cardiovascular;  Laterality: N/A;  . CARDIOVERSION N/A 11/03/2016   Procedure: CARDIOVERSION;  Surgeon: [name], MD;  Location: [facility];  Service: Cardiovascular;  Laterality: N/A;  . CARDIOVERSION N/A 11/18/2016   Procedure: CARDIOVERSION;  Surgeon: [name], MD;  Location: [facility];  Service: Cardiovascular;  Laterality: N/A;  . CARDIOVERSION N/A 03/25/2017   Procedure: CARDIOVERSION;  Surgeon: [name], MD;  Location[facility] ENDOSCOPY;  Service: Cardiovascular;  Laterality: N/A;  . CARDIOVERSION N/A 05/06/2017   Procedure: CARDIOVERSION;  Surgeon: [name], MD;  Location: [facility] ENDOSCOPY;  Service: Cardiovascular;  Laterality: N/A;  . CARDIOVERSION N/A 05/12/2017   Procedure: CARDIOVERSION;  Surgeon: [name], MD;  Location: [facility] ENDOSCOPY;  Service: Cardiovascular;  Laterality: N/A;  . COLONOSCOPY N/A 12/04/2017   Procedure: COLONOSCOPY;  Surgeon: [name], MD;  Location: [place];  Service: Endoscopy;  Laterality: N/A;  . COLONOSCOPY    . COLONOSCOPY W/ POLYPECTOMY  02/2011   pan diverticulosis.  tubular adenoma without dysplasia on 5 mm sigmoid polyp.  Dr Fuller Plan.    . ESOPHAGOGASTRODUODENOSCOPY (EGD) WITH PROPOFOL N/A 05/16/2020   Procedure: ESOPHAGOGASTRODUODENOSCOPY (EGD) WITH PROPOFOL;  Surgeon: Milus Banister, MD;  Location: [facility] ENDOSCOPY;  Service: Endoscopy;  Laterality:  N/A;  . FLEXIBLE SIGMOIDOSCOPY N/A 01/18/2020   Procedure: FLEXIBLE SIGMOIDOSCOPY;  Surgeon: [name], DO;  Location: [place];  Service: Gastroenterology;  Laterality: N/A;  . HEMOSTASIS CLIP PLACEMENT  01/18/2020   Procedure: HEMOSTASIS CLIP PLACEMENT;  Surgeon: [name], DO;  Location: [place];  Service: Gastroenterology;;  . HOT HEMOSTASIS N/A 05/16/2020   Procedure: HOT HEMOSTASIS (ARGON PLASMA COAGULATION/BICAP);  Surgeon: Milus Banister, MD;  Location: [facility];  Service: Endoscopy;  Laterality: N/A;  . PARTIAL HYSTERECTOMY     1 OVARY LEFT  . SCLEROTHERAPY  01/18/2020   Procedure: SCLEROTHERAPY;  Surgeon: [name], DO;  Location: [place] ENDOSCOPY;  Service: Gastroenterology;;  . TEE WITHOUT CARDIOVERSION N/A 10/31/2016   Procedure: TRANSESOPHAGEAL ECHOCARDIOGRAM (TEE);  Surgeon: [name], MD;  Location: [facility] ENDOSCOPY;  Service: Cardiovascular;  Laterality: N/A;  . TEE WITHOUT CARDIOVERSION N/A 03/25/2017   Procedure: TRANSESOPHAGEAL ECHOCARDIOGRAM (TEE);  Surgeon: [name], MD;  Location: [place] ENDOSCOPY;  Service: Cardiovascular;  Laterality: N/A;  . TOTAL KNEE ARTHROPLASTY Right 04/10/2020   Procedure: TOTAL KNEE ARTHROPLASTY;  Surgeon: Renette Butters, MD;  Location: [facility];  Service: Orthopedics;  Laterality: Right;   Social History   Social History Narrative  . Not on file   Immunization History  Administered Date(s) Administered  . Fluad Quad(high Dose 65+) 09/07/2019  . Influenza Whole 01/09/2011  . Influenza, High Dose Seasonal PF 08/30/2018  . Influenza,inj,Quad PF,6+ Mos 10/25/2015, 10/24/2016, 07/21/2017  . PFIZER SARS-COV-2 Vaccination 04/28/2020, 05/24/2020  . Pneumococcal Conjugate-13 04/15/2019  . Pneumococcal Polysaccharide-23 10/24/2016, 03/18/2018  . Td 02/02/2009  . Tdap 04/15/2019     Objective: Vital Signs: BP (!) 132/85 (BP Location: Right Arm, Patient Position: Sitting, Cuff Size: Normal)   Pulse 92    Resp 16   Ht 5\' 4"  (1.626 m)   Wt 178 lb 6.4 oz (80.9 kg)   BMI 30.62 kg/m    Physical Exam Vitals and nursing note reviewed.  Constitutional:      Appearance: She is well-developed.  HENT:     Head: Normocephalic and atraumatic.  Eyes:     Conjunctiva/sclera: Conjunctivae normal.  Cardiovascular:     Rate and Rhythm: Normal rate and regular rhythm.     Heart sounds: Normal heart sounds.  Pulmonary:     Effort: Pulmonary effort is normal.     Breath sounds: Normal breath sounds.  Abdominal:     General: Bowel sounds are normal.     Palpations: Abdomen is soft.  Musculoskeletal:     Cervical back: Normal range of motion.  Lymphadenopathy:     Cervical: No cervical adenopathy.  Skin:    General: Skin is warm and dry.     Capillary Refill: Capillary refill takes less than 2 seconds.  Neurological:     Mental Status: She is alert and oriented to person, place, and time.  Psychiatric:        Behavior: Behavior normal.      Musculoskeletal Exam: C-spine was in good range of motion.  Thoracic and lumbar spine were difficult to assess due to patient using walker.  She had full range of motion of her shoulder joints with some discomfort.  She had olecranon bursitis over her left elbow.  There are palpable nodules over bilateral elbows.  Wrist joints are in good range of motion.  She has thickening of bilateral second MCP joints.  She also has PIP thickening and some inflammation of her PIP joints.  Hip joints are in good range of motion.  Her right knee joint is replaced which was warm to touch.  She has warmth on palpation of left knee joint.  She has swelling over bilateral ankle joints due to pedal edema.  She had tenderness across her MTPs.  CDAI Exam: CDAI Score: -- Patient Global: --; Provider Global: -- Swollen: --; Tender: -- Joint Exam 06/19/2020   No joint exam has been documented for this visit   There is currently no information documented on the homunculus. Go to  the Rheumatology activity and complete the homunculus joint exam.  Investigation: No additional findings.  Imaging: No results found.  Recent Labs: Lab Results  Component Value Date   WBC 6.7 05/31/2020   HGB 10.4 (L) 05/31/2020   PLT 322.0 05/31/2020   NA 142 05/29/2020   K 4.0 05/29/2020   CL 101 05/29/2020   CO2 25 05/29/2020   GLUCOSE 88 05/29/2020   BUN 22 05/29/2020  CREATININE 1.59 (H) 05/29/2020   BILITOT 0.7 05/03/2020   ALKPHOS 83 05/03/2020   AST 16 05/03/2020   ALT 11 05/03/2020   PROT 7.2 05/03/2020   ALBUMIN 2.8 (L) 05/03/2020   CALCIUM 9.3 05/29/2020   GFRAA 39 (L) 05/29/2020    Speciality Comments: No specialty comments available.  Procedures:  No procedures performed Allergies: Ace inhibitors   Assessment / Plan:     Visit Diagnoses: Pain in both hands -she has pain and discomfort in her bilateral hands.  She has swelling in some of her joints today.  The differential diagnosis could be autoimmune disease versus gouty arthropathy.  She has 2 nodules over her elbows which could be tophi.  She had some olecranon bursitis in her left elbow.  Plan: XR Hand 2 View Right, XR Hand 2 View Left, x-rays were consistent with inflammatory arthritis.  Differential diagnosis will be rheumatoid arthritis, rheumatoid arthritis and gouty arthropathy overlap.  No erosive changes were noted.  Rheumatoid factor, Cyclic citrul peptide antibody, IgG, 14-3-3 eta Protein, Uric acid, Sedimentation rate, Glucose 6 phosphate dehydrogenase  Chronic pain of both shoulders-she has painful range of motion of bilateral shoulder joints.  Pain in both feet -she gives history of pain and discomfort in her bilateral ankles and her feet.  Plan: XR Foot 2 Views Right, XR Foot 2 Views Left x-rays is consistent with inflammatory arthritis.  No erosive changes were noted.  Positive ANA (antinuclear antibody) - 01/24/20: ANA+ no titer -she has no clinical features of lupus.  I will obtain  additional labs today.  Plan: ANA, RNP Antibody, Anti-Smith antibody, Sjogrens syndrome-A extractable nuclear antibody, Sjogrens syndrome-B extractable nuclear antibody, Anti-DNA antibody, double-stranded, Anti-scleroderma antibody, C3 and C4  Status post total knee replacement, right - May 2019 by Dr. Percell Miller  Primary osteoarthritis of left knee - End-stage osteoarthritis per patient.  Raynaud's disease without gangrene-patient gives history of Raynaud's off and on during wintertime.   Other medical problems are listed as follows:   CAD in native artery, s/p cardiac cath with non obstructive CAD  Cardiogenic shock ([place])  Cerebrovascular accident (CVA) due to embolism of left cerebellar artery (HCC)  Chronic systolic congestive heart failure (HCC)  Chronic atrial fibrillation  Chronic anticoagulation  Essential hypertension  NICM (nonischemic cardiomyopathy) ([place])  Diverticulosis of colon with hemorrhage  THYROID NODULE, RIGHT  Acute renal failure with acute tubular necrosis superimposed on stage 4 chronic kidney disease (HCC)  Acquired thrombophilia ([place])  Coagulopathy ([place])  Bacteremia due to Gram-negative bacteria  Hyperbilirubinemia  Vitamin D deficiency  Orders: Orders Placed This Encounter  Procedures  . XR Hand 2 View Right  . XR Hand 2 View Left  . XR Foot 2 Views Right  . XR Foot 2 Views Left  . Rheumatoid factor  . Cyclic citrul peptide antibody, IgG  . 14-3-3 eta Protein  . Uric acid  . Sedimentation rate  . ANA  . RNP Antibody  . Anti-Smith antibody  . Sjogrens syndrome-A extractable nuclear antibody  . Sjogrens syndrome-B extractable nuclear antibody  . Anti-DNA antibody, double-stranded  . Anti-scleroderma antibody  . C3 and C4  . Glucose 6 phosphate dehydrogenase   No orders of the defined types were placed in this encounter.    Follow-Up Instructions: Return for Inflammatory arthritis, osteoarthritis.   Bo Merino,  MD  Note - This record has been created using Editor, commissioning.  Chart creation errors have been sought, but may not always  have been located. Such creation  errors do not reflect on  the standard of medical care.

## 2020-06-13 DIAGNOSIS — M1711 Unilateral primary osteoarthritis, right knee: Secondary | ICD-10-CM | POA: Diagnosis not present

## 2020-06-13 DIAGNOSIS — M6281 Muscle weakness (generalized): Secondary | ICD-10-CM | POA: Diagnosis not present

## 2020-06-13 DIAGNOSIS — R262 Difficulty in walking, not elsewhere classified: Secondary | ICD-10-CM | POA: Diagnosis not present

## 2020-06-13 DIAGNOSIS — M25661 Stiffness of right knee, not elsewhere classified: Secondary | ICD-10-CM | POA: Diagnosis not present

## 2020-06-19 ENCOUNTER — Encounter: Payer: Self-pay | Admitting: Rheumatology

## 2020-06-19 ENCOUNTER — Ambulatory Visit (INDEPENDENT_AMBULATORY_CARE_PROVIDER_SITE_OTHER): Payer: Medicare HMO

## 2020-06-19 ENCOUNTER — Ambulatory Visit: Payer: Medicare HMO | Admitting: Rheumatology

## 2020-06-19 ENCOUNTER — Ambulatory Visit: Payer: Self-pay

## 2020-06-19 ENCOUNTER — Other Ambulatory Visit: Payer: Self-pay

## 2020-06-19 VITALS — BP 132/85 | HR 92 | Resp 16 | Ht 64.0 in | Wt 178.4 lb

## 2020-06-19 DIAGNOSIS — R570 Cardiogenic shock: Secondary | ICD-10-CM | POA: Diagnosis not present

## 2020-06-19 DIAGNOSIS — E559 Vitamin D deficiency, unspecified: Secondary | ICD-10-CM

## 2020-06-19 DIAGNOSIS — M79642 Pain in left hand: Secondary | ICD-10-CM

## 2020-06-19 DIAGNOSIS — I5022 Chronic systolic (congestive) heart failure: Secondary | ICD-10-CM

## 2020-06-19 DIAGNOSIS — I2510 Atherosclerotic heart disease of native coronary artery without angina pectoris: Secondary | ICD-10-CM | POA: Diagnosis not present

## 2020-06-19 DIAGNOSIS — M1712 Unilateral primary osteoarthritis, left knee: Secondary | ICD-10-CM | POA: Diagnosis not present

## 2020-06-19 DIAGNOSIS — E041 Nontoxic single thyroid nodule: Secondary | ICD-10-CM

## 2020-06-19 DIAGNOSIS — I10 Essential (primary) hypertension: Secondary | ICD-10-CM

## 2020-06-19 DIAGNOSIS — Z96651 Presence of right artificial knee joint: Secondary | ICD-10-CM

## 2020-06-19 DIAGNOSIS — M1711 Unilateral primary osteoarthritis, right knee: Secondary | ICD-10-CM | POA: Diagnosis not present

## 2020-06-19 DIAGNOSIS — M79641 Pain in right hand: Secondary | ICD-10-CM

## 2020-06-19 DIAGNOSIS — D6869 Other thrombophilia: Secondary | ICD-10-CM

## 2020-06-19 DIAGNOSIS — R768 Other specified abnormal immunological findings in serum: Secondary | ICD-10-CM

## 2020-06-19 DIAGNOSIS — G8929 Other chronic pain: Secondary | ICD-10-CM

## 2020-06-19 DIAGNOSIS — I428 Other cardiomyopathies: Secondary | ICD-10-CM

## 2020-06-19 DIAGNOSIS — M79671 Pain in right foot: Secondary | ICD-10-CM | POA: Diagnosis not present

## 2020-06-19 DIAGNOSIS — R262 Difficulty in walking, not elsewhere classified: Secondary | ICD-10-CM | POA: Diagnosis not present

## 2020-06-19 DIAGNOSIS — I7300 Raynaud's syndrome without gangrene: Secondary | ICD-10-CM

## 2020-06-19 DIAGNOSIS — M25511 Pain in right shoulder: Secondary | ICD-10-CM | POA: Diagnosis not present

## 2020-06-19 DIAGNOSIS — R7881 Bacteremia: Secondary | ICD-10-CM

## 2020-06-19 DIAGNOSIS — R7689 Other specified abnormal immunological findings in serum: Secondary | ICD-10-CM

## 2020-06-19 DIAGNOSIS — M79672 Pain in left foot: Secondary | ICD-10-CM

## 2020-06-19 DIAGNOSIS — M6281 Muscle weakness (generalized): Secondary | ICD-10-CM | POA: Diagnosis not present

## 2020-06-19 DIAGNOSIS — K5731 Diverticulosis of large intestine without perforation or abscess with bleeding: Secondary | ICD-10-CM

## 2020-06-19 DIAGNOSIS — D689 Coagulation defect, unspecified: Secondary | ICD-10-CM

## 2020-06-19 DIAGNOSIS — N184 Chronic kidney disease, stage 4 (severe): Secondary | ICD-10-CM

## 2020-06-19 DIAGNOSIS — Z7901 Long term (current) use of anticoagulants: Secondary | ICD-10-CM

## 2020-06-19 DIAGNOSIS — I482 Chronic atrial fibrillation, unspecified: Secondary | ICD-10-CM

## 2020-06-19 DIAGNOSIS — I63442 Cerebral infarction due to embolism of left cerebellar artery: Secondary | ICD-10-CM

## 2020-06-19 DIAGNOSIS — M25661 Stiffness of right knee, not elsewhere classified: Secondary | ICD-10-CM | POA: Diagnosis not present

## 2020-06-19 DIAGNOSIS — N170 Acute kidney failure with tubular necrosis: Secondary | ICD-10-CM

## 2020-06-19 DIAGNOSIS — M25512 Pain in left shoulder: Secondary | ICD-10-CM

## 2020-06-21 DIAGNOSIS — R262 Difficulty in walking, not elsewhere classified: Secondary | ICD-10-CM | POA: Diagnosis not present

## 2020-06-21 DIAGNOSIS — M6281 Muscle weakness (generalized): Secondary | ICD-10-CM | POA: Diagnosis not present

## 2020-06-21 DIAGNOSIS — M25561 Pain in right knee: Secondary | ICD-10-CM | POA: Diagnosis not present

## 2020-06-21 DIAGNOSIS — M25661 Stiffness of right knee, not elsewhere classified: Secondary | ICD-10-CM | POA: Diagnosis not present

## 2020-06-25 DIAGNOSIS — R262 Difficulty in walking, not elsewhere classified: Secondary | ICD-10-CM | POA: Diagnosis not present

## 2020-06-25 DIAGNOSIS — M1711 Unilateral primary osteoarthritis, right knee: Secondary | ICD-10-CM | POA: Diagnosis not present

## 2020-06-25 DIAGNOSIS — M25661 Stiffness of right knee, not elsewhere classified: Secondary | ICD-10-CM | POA: Diagnosis not present

## 2020-06-25 DIAGNOSIS — M6281 Muscle weakness (generalized): Secondary | ICD-10-CM | POA: Diagnosis not present

## 2020-06-26 LAB — URIC ACID: Uric Acid, Serum: 9.1 mg/dL — ABNORMAL HIGH (ref 2.5–7.0)

## 2020-06-26 LAB — ANA: Anti Nuclear Antibody (ANA): POSITIVE — AB

## 2020-06-26 LAB — GLUCOSE 6 PHOSPHATE DEHYDROGENASE: G-6PDH: 24.6 U/g Hgb — ABNORMAL HIGH (ref 7.0–20.5)

## 2020-06-26 LAB — ANTI-NUCLEAR AB-TITER (ANA TITER)
ANA TITER: 1:1280 {titer} — ABNORMAL HIGH
ANA Titer 1: 1:80 {titer} — ABNORMAL HIGH

## 2020-06-26 LAB — ANTI-SMITH ANTIBODY: ENA SM Ab Ser-aCnc: <1 AI

## 2020-06-26 LAB — RNP ANTIBODY: Ribonucleic Protein(ENA) Antibody, IgG: <1 AI

## 2020-06-26 LAB — C3 AND C4
C3 Complement: 169 mg/dL (ref 83–193)
C4 Complement: 53 mg/dL (ref 15–57)

## 2020-06-26 LAB — SJOGRENS SYNDROME-A EXTRACTABLE NUCLEAR ANTIBODY: SSA (Ro) (ENA) Antibody, IgG: <1 AI

## 2020-06-26 LAB — CYCLIC CITRUL PEPTIDE ANTIBODY, IGG: Cyclic Citrullin Peptide Ab: >250 UNITS — ABNORMAL HIGH

## 2020-06-26 LAB — ANTI-DNA ANTIBODY, DOUBLE-STRANDED: ds DNA Ab: 1 IU/mL

## 2020-06-26 LAB — ANTI-SCLERODERMA ANTIBODY: Scleroderma (Scl-70) (ENA) Antibody, IgG: <1 AI

## 2020-06-26 LAB — SJOGRENS SYNDROME-B EXTRACTABLE NUCLEAR ANTIBODY: SSB (La) (ENA) Antibody, IgG: <1 AI

## 2020-06-26 LAB — 14-3-3 ETA PROTEIN: 14-3-3 eta Protein: <0.2 ng/mL (ref <0.2)

## 2020-06-26 LAB — SEDIMENTATION RATE: Sed Rate: 116 mm/h — ABNORMAL HIGH (ref 0–30)

## 2020-06-26 LAB — RHEUMATOID FACTOR: Rheumatoid fact SerPl-aCnc: 482 IU/mL — ABNORMAL HIGH (ref <14)

## 2020-06-28 DIAGNOSIS — M25661 Stiffness of right knee, not elsewhere classified: Secondary | ICD-10-CM | POA: Diagnosis not present

## 2020-06-28 DIAGNOSIS — M6281 Muscle weakness (generalized): Secondary | ICD-10-CM | POA: Diagnosis not present

## 2020-06-28 DIAGNOSIS — R262 Difficulty in walking, not elsewhere classified: Secondary | ICD-10-CM | POA: Diagnosis not present

## 2020-06-28 DIAGNOSIS — M1711 Unilateral primary osteoarthritis, right knee: Secondary | ICD-10-CM | POA: Diagnosis not present

## 2020-07-03 DIAGNOSIS — M25661 Stiffness of right knee, not elsewhere classified: Secondary | ICD-10-CM | POA: Diagnosis not present

## 2020-07-03 DIAGNOSIS — R262 Difficulty in walking, not elsewhere classified: Secondary | ICD-10-CM | POA: Diagnosis not present

## 2020-07-03 DIAGNOSIS — M25561 Pain in right knee: Secondary | ICD-10-CM | POA: Diagnosis not present

## 2020-07-03 DIAGNOSIS — M6281 Muscle weakness (generalized): Secondary | ICD-10-CM | POA: Diagnosis not present

## 2020-07-05 DIAGNOSIS — M1711 Unilateral primary osteoarthritis, right knee: Secondary | ICD-10-CM | POA: Diagnosis not present

## 2020-07-05 DIAGNOSIS — R262 Difficulty in walking, not elsewhere classified: Secondary | ICD-10-CM | POA: Diagnosis not present

## 2020-07-05 DIAGNOSIS — M6281 Muscle weakness (generalized): Secondary | ICD-10-CM | POA: Diagnosis not present

## 2020-07-05 DIAGNOSIS — M25661 Stiffness of right knee, not elsewhere classified: Secondary | ICD-10-CM | POA: Diagnosis not present

## 2020-07-08 NOTE — Progress Notes (Signed)
Office Visit Note  Patient: Patricia Wagner             Date of Birth: Apr 18, 1951           MRN: [id number]             PCP: Maximiano Coss, NP Referring: Maximiano Coss, NP Visit Date: 07/17/2020 Occupation: _0 @  Subjective:  Pain and swelling in multiple joints.   History of Present Illness: Patricia Wagner is a 69 y.o. female with seropositive rheumatoid arthritis and osteoarthritis.  She states she continues to have pain and discomfort in almost all of her joints.  She described pain in her bilateral shoulders, bilateral knees and her bilateral ankles.  She has been having pain and swelling in her hands.  She denies having any gout flare.  Activities of Daily Living:  Patient reports morning stiffness for several  hours.   Patient Reports nocturnal pain.  Difficulty dressing/grooming: Denies Difficulty climbing stairs: Reports Difficulty getting out of chair: Reports Difficulty using hands for taps, buttons, cutlery, and/or writing: Reports  Review of Systems  Constitutional: Negative for fatigue.  HENT: Negative for mouth sores, mouth dryness and nose dryness.   Eyes: Negative for itching and dryness.  Respiratory: Negative for shortness of breath and difficulty breathing.   Cardiovascular: Positive for swelling in legs/feet. Negative for chest pain and palpitations.  Gastrointestinal: Negative for blood in stool, constipation and diarrhea.  Endocrine: Negative for increased urination.  Genitourinary: Negative for pelvic pain.  Musculoskeletal: Positive for arthralgias, joint pain, joint swelling and morning stiffness. Negative for myalgias, muscle tenderness and myalgias.  Skin: Negative for color change, rash and redness.  Allergic/Immunologic: Negative for susceptible to infections.  Neurological: Negative for dizziness, numbness, headaches, memory loss and weakness.  Hematological: Negative for bruising/bleeding tendency.  Psychiatric/Behavioral: Negative for  confusion.    PMFS History:  Patient Active Problem List   Diagnosis Date Noted  . Acute GI bleeding 05/14/2020  . Primary osteoarthritis of right knee 03/20/2020  . History of lower GI bleeding 03/20/2020  . Acute lower GI bleeding 01/18/2020  . Acquired thrombophilia ([place]) 11/14/2019  . Persistent atrial fibrillation ([place]) 10/13/2019  . Allergic rhinitis 09/10/2019  . Left ear pain 09/10/2019  . Vitamin D deficiency 04/15/2019  . Cardiogenic shock ([place])   . Acute on chronic respiratory failure with hypoxia ([place])   . Elevated troponin 08/05/2018  . Hyperbilirubinemia 08/05/2018  . Abnormal transaminases 08/05/2018  . Dysarthria 08/05/2018  . Lower GI bleed 03/15/2018  . Chronic anticoagulation 03/15/2018  . Rectal bleed   . Acute renal failure superimposed on stage 4 chronic kidney disease ([place]) 03/05/2018  . Coagulopathy ([place]) 03/05/2018  . Chronic systolic (congestive) heart failure ([place]) 03/05/2018  . Chronic atrial fibrillation 03/05/2018  . Acute blood loss anemia 03/05/2018  . Diverticulosis 03/05/2018  . Heme positive stool 03/05/2018  . Urine abnormality 02/02/2018  . Cough 02/02/2018  . Bacteremia due to Gram-negative bacteria 12/30/2017  . Increased anion gap metabolic acidosis 74/25/9563  . Metabolic alkalosis 87/56/4332  . Acute UTI 12/29/2017  . Obstructive uropathy   . Anemia 12/17/2017  . AKI (acute kidney injury) ([place]) 11/30/2017  . Diarrhea 11/30/2017  . Urinary retention 11/23/2017  . Lower abdominal pain 11/23/2017  . Dysuria 07/21/2017  . Suspected sleep apnea 05/20/2017  . Pressure injury of skin 05/02/2017  . Acute respiratory failure with hypoxia ([place]) 04/30/2017  . Hypotension (arterial) 04/30/2017  . Bradycardia 11/20/2016  . Chronic combined systolic (congestive) and  diastolic (congestive) heart failure ([place]) 11/20/2016  . Cerebrovascular accident (CVA) due to embolism of left cerebellar artery ([place])   . PAF (paroxysmal atrial  fibrillation) ([place])   . CHF (congestive heart failure) ([place]) 10/31/2016  . CAD in native artery, s/p cardiac cath with non obstructive CAD 10/24/2016  . NICM (nonischemic cardiomyopathy) ([place]) 10/24/2016  . Tobacco abuse 10/20/2016  . Acute renal failure superimposed on chronic kidney disease ([place]) 10/20/2016  . Hematochezia 06/29/2013  . Degenerative arthritis of left knee 03/15/2012  . Encounter for well adult exam with abnormal findings 03/12/2012  . Hypersomnia   . MENOPAUSAL DISORDER 01/09/2011  . THYROID NODULE, RIGHT 01/04/2010  . HLD (hyperlipidemia) 02/03/2008  . LEG CRAMPS, NOCTURNAL 02/03/2008  . LOW BACK PAIN 07/25/2007  . Morbid obesity ([place]) 07/22/2007  . Essential hypertension 07/22/2007  . Raynaud's syndrome 07/22/2007  . Diverticulosis of colon with hemorrhage 07/22/2007  . SYMPTOM, EDEMA 07/22/2007    Past Medical History:  Diagnosis Date  . Acute blood loss anemia 03/05/2018  . Acute hypercapnic respiratory failure ([place]) 04/30/2017  . Acute renal failure superimposed on chronic kidney disease ([place]) 10/20/2016  . Arthritis    hands, knees  . Atrial fibrillation ([place])    a. s/p multiple cardioversions; failed tikosyn/sotalol.  . Bradycardia 11/20/2016  . CAD in native artery, s/p cardiac cath with non obstructive CAD 10/24/2016  . Cerebrovascular accident (CVA) due to embolism of left cerebellar artery ([place])   . CHF (congestive heart failure) ([place])   . Chronic anticoagulation 03/15/2018  . Chronic atrial fibrillation ([name]) 03/05/2018  . Chronic systolic (congestive) heart failure ([place]) 03/05/2018  . CKD (chronic kidney disease) stage 3, GFR 30-59 ml/min   . Coagulopathy ([place]) 03/05/2018  . DIVERTICULITIS, HX OF 07/25/2007  . Diverticulosis of colon with hemorrhage 07/22/2007   Qualifier: Diagnosis of  By: [name]    . DIVERTICULOSIS, COLON 07/22/2007  . Dysuria 07/21/2017  . Edema, peripheral    a. chronic BLE edema, R>L. Prior trauma from dog attack and  accident.  . Essential hypertension 07/22/2007   Qualifier: Diagnosis of  By: [name]    . History of kidney stones   . HLD (hyperlipidemia) 02/03/2008   Qualifier: Diagnosis of  By: [name], [name]   . HYPERLIPIDEMIA 02/03/2008  . Hypersomnia    declines w/u  . Hypertension   . Hypotension (arterial) 04/30/2017  . MENOPAUSAL DISORDER 01/09/2011  . Morbid obesity ([place]) 07/22/2007  . NICM (nonischemic cardiomyopathy) ([place]) 10/24/2016  . PAF (paroxysmal atrial fibrillation) ([place])   . Presence of indwelling urinary catheter    supra pubic cath  . Raynaud's syndrome 07/22/2007  . Stroke ([place]) 2017  . Suspected sleep apnea 05/20/2017  . THYROID NODULE, RIGHT 01/04/2010  . VITAMIN D DEFICIENCY 01/09/2011   Qualifier: Diagnosis of  By: [name], [name]     Family History  Problem Relation Age of Onset  . Asthma Mother   . Diabetes Father   . Heart disease Father        Died of presumed heart attack - 27s  . Lung disease Sister   . Heart disease Sister        Twin sister has heart issue, unclear what kind  . Healthy Daughter   . Thyroid disease Neg Hx   . Colon polyps Neg Hx   . Esophageal cancer Neg Hx   . Pancreatic cancer Neg Hx   . Stomach cancer Neg Hx    Past Surgical History:  Procedure Laterality Date  . ATRIAL FIBRILLATION ABLATION N/A 10/13/2019   Procedure: ATRIAL FIBRILLATION ABLATION;  Surgeon: [name], MD;  Location: [place] [facility];  Service: Cardiovascular;  Laterality: N/A;  . CARDIAC CATHETERIZATION N/A 10/23/2016   Procedure: Left Heart Cath and Coronary Angiography;  Surgeon: [name], MD;  Location: [place] [facility];  Service: Cardiovascular;  Laterality: N/A;  . CARDIOVERSION N/A 10/31/2016   Procedure: CARDIOVERSION;  Surgeon: [name], MD;  Location: [place];  Service: Cardiovascular;  Laterality: N/A;  . CARDIOVERSION N/A 11/03/2016   Procedure: CARDIOVERSION;  Surgeon: [name], MD;  Location: [place];   Service: Cardiovascular;  Laterality: N/A;  . CARDIOVERSION N/A 11/18/2016   Procedure: CARDIOVERSION;  Surgeon: [name], MD;  Location: [place];  Service: Cardiovascular;  Laterality: N/A;  . CARDIOVERSION N/A 03/25/2017   Procedure: CARDIOVERSION;  Surgeon: [name], MD;  Location: [facility] ENDOSCOPY;  Service: Cardiovascular;  Laterality: N/A;  . CARDIOVERSION N/A 05/06/2017   Procedure: CARDIOVERSION;  Surgeon: [name], MD;  Location: [facility] ENDOSCOPY;  Service: Cardiovascular;  Laterality: N/A;  . CARDIOVERSION N/A 05/12/2017   Procedure: CARDIOVERSION;  Surgeon: [name], MD;  Location: [facility]COPY;  Service: Cardiovascular;  Laterality: N/A;  . COLONOSCOPY N/A 12/04/2017   Procedure: COLONOSCOPY;  Surgeon: [name], MD;  Location: [place];  Service: Endoscopy;  Laterality: N/A;  . COLONOSCOPY    . COLONOSCOPY W/ POLYPECTOMY  02/2011   pan diverticulosis.  tubular adenoma without dysplasia on 5 mm sigmoid polyp.  Dr Fuller Plan.    . ESOPHAGOGASTRODUODENOSCOPY (EGD) WITH PROPOFOL N/A 05/16/2020   Procedure: ESOPHAGOGASTRODUODENOSCOPY (EGD) WITH PROPOFOL;  Surgeon: Milus Banister, MD;  Location: [facility] ENDOSCOPY;  Service: Endoscopy;  Laterality: N/A;  . FLEXIBLE SIGMOIDOSCOPY N/A 01/18/2020   Procedure: FLEXIBLE SIGMOIDOSCOPY;  Surgeon: [name], DO;  Location: [place];  Service: Gastroenterology;  Laterality: N/A;  . HEMOSTASIS CLIP PLACEMENT  01/18/2020   Procedure: HEMOSTASIS CLIP PLACEMENT;  Surgeon: [name], DO;  Location: [place];  Service: Gastroenterology;;  . HOT HEMOSTASIS N/A 05/16/2020   Procedure: HOT HEMOSTASIS (ARGON PLASMA COAGULATION/BICAP);  Surgeon: Milus Banister, MD;  Location: [facility];  Service: Endoscopy;  Laterality: N/A;  . PARTIAL HYSTERECTOMY     1 OVARY LEFT  . SCLEROTHERAPY  01/18/2020   Procedure: SCLEROTHERAPY;  Surgeon: [name], DO;  Location: [place] ENDOSCOPY;  Service:  Gastroenterology;;  . TEE WITHOUT CARDIOVERSION N/A 10/31/2016   Procedure: TRANSESOPHAGEAL ECHOCARDIOGRAM (TEE);  Surgeon: [name], MD;  Location: [facility] ENDOSCOPY;  Service: Cardiovascular;  Laterality: N/A;  . TEE WITHOUT CARDIOVERSION N/A 03/25/2017   Procedure: TRANSESOPHAGEAL ECHOCARDIOGRAM (TEE);  Surgeon: [name], MD;  Location: [facility] ENDOSCOPY;  Service: Cardiovascular;  Laterality: N/A;  . TOTAL KNEE ARTHROPLASTY Right 04/10/2020   Procedure: TOTAL KNEE ARTHROPLASTY;  Surgeon: Renette Butters, MD;  Location: [facility];  Service: Orthopedics;  Laterality: Right;   Social History   Social History Narrative  . Not on file   Immunization History  Administered Date(s) Administered  . Fluad Quad(high Dose 65+) 09/07/2019  . Influenza Whole 01/09/2011  . Influenza, High Dose Seasonal PF 08/30/2018  . Influenza,inj,Quad PF,6+ Mos 10/25/2015, 10/24/2016, 07/21/2017  . PFIZER SARS-COV-2 Vaccination 04/28/2020, 05/24/2020  . Pneumococcal Conjugate-13 04/15/2019  . Pneumococcal Polysaccharide-23 10/24/2016, 03/18/2018  . Td 02/02/2009  . Tdap 04/15/2019     Objective: Vital Signs: BP 109/72 (BP Location: Left Arm, Patient Position: Sitting,  Cuff Size: Normal)   Pulse 90   Resp 16   Ht _0  (1.626 m)   Wt 179 lb 9.6 oz (81.5 kg)   BMI 30.83 kg/m    Physical Exam Vitals and nursing note reviewed.  Constitutional:      Appearance: She is well-developed.  HENT:     Head: Normocephalic and atraumatic.  Eyes:     Conjunctiva/sclera: Conjunctivae normal.  Cardiovascular:     Rate and Rhythm: Normal rate and regular rhythm.     Heart sounds: Normal heart sounds.  Pulmonary:     Effort: Pulmonary effort is normal.     Breath sounds: Normal breath sounds.  Abdominal:     General: Bowel sounds are normal.     Palpations: Abdomen is soft.  Musculoskeletal:     Cervical back: Normal range of motion.  Lymphadenopathy:     Cervical: No cervical adenopathy.  Skin:     General: Skin is warm and dry.     Capillary Refill: Capillary refill takes less than 2 seconds.  Neurological:     Mental Status: She is alert and oriented to person, place, and time.  Psychiatric:        Behavior: Behavior normal.      Musculoskeletal Exam: She has some limitation range of motion of cervical spine.  She had discomfort range of motion of her shoulder joints.  She had nodulosis over bilateral elbows.  She had tenderness on bilateral wrist joints.  Swelling was noted over all MCPs and PIPs.  Hip joint, knee joints are in good range of motion.  Her right knee joint is replaced.  She has warmth of bilateral knee joints.  She has tenderness and swelling her bilateral ankle joints.  CDAI Exam: CDAI Score: 35.4  Patient Global: 6 mm; Provider Global: 8 mm Swollen: 18 ; Tender: 25  Joint Exam 07/17/2020      Right  Left  Glenohumeral   Tender   Tender  Wrist  Swollen Tender  Swollen Tender  MCP 2  Swollen Tender  Swollen Tender  MCP 3  Swollen Tender  Swollen Tender  MCP 5  Swollen Tender  Swollen Tender  PIP 2  Swollen Tender  Swollen Tender  PIP 3  Swollen Tender  Swollen Tender  PIP 4  Swollen Tender  Swollen Tender  Knee  Swollen Tender  Swollen Tender  Ankle  Swollen Tender  Swollen Tender  MTP 1      Tender  MTP 2   Tender   Tender  MTP 3      Tender  MTP 4   Tender        Investigation: No additional findings.  Imaging: XR Foot 2 Views Left  Result Date: 06/19/2020 Narrowing of all MTP joints, PIP and DIP joints was noted.  Intertarsal and tibiotalar joint space narrowing was noted.  Collapse of subtalar joint was noted.  Inferior and posterior calcaneal spurs were noted.  Calcification of blood vessels was noted.  No erosive changes were noted. Impression: These findings are consistent with inflammatory arthritis.  XR Foot 2 Views Right  Result Date: 06/19/2020 Narrowing of MCPs, PIPs and DIPs was noted.  Tibiotalar, subtalar joint space narrowing was  noted.  Inferior and posterior calcaneal spurs were noted.  Calcification of blood vessels was noted. Impression: These findings are consistent with inflammatory arthritis and osteoarthritis overlap.  XR Hand 2 View Left  Result Date: 06/19/2020 Juxta-articular osteopenia was noted.  Narrowing of second MCP  joint, PIP joints and DIP joints was noted.  CMC, intercarpal and radiocarpal joint space narrowing was noted.  No erosive changes were noted. Impression: These findings are consistent with inflammatory arthritis.  XR Hand 2 View Right  Result Date: 06/19/2020 Juxta-articular osteopenia was noted.  Narrowing of all MCP joints, PIP joints, intercarpal and radiocarpal joints was noted.  No erosive changes were noted. Impression: These findings are consistent with inflammatory arthritis.   Recent Labs: Lab Results  Component Value Date   WBC 11.2 (H) 07/09/2020   HGB 10.7 (L) 07/09/2020   PLT 277.0 07/09/2020   NA 142 05/29/2020   K 4.0 05/29/2020   CL 101 05/29/2020   CO2 25 05/29/2020   GLUCOSE 88 05/29/2020   BUN 22 05/29/2020   CREATININE 1.59 (H) 05/29/2020   BILITOT 0.7 05/03/2020   ALKPHOS 83 05/03/2020   AST 16 05/03/2020   ALT 11 05/03/2020   PROT 7.2 05/03/2020   ALBUMIN 2.8 (L) 05/03/2020   CALCIUM 9.3 05/29/2020   GFRAA 39 (L) 05/29/2020   June 19, 2020 ANA 1: 1280 speckled, (double-stranded DNA, SSB, SSA, Smith, RNP, SCL 70 -), C3-C4 normal, RF 482, anti-CCP> 250, _0 eta negative, uric acid 9.1, ESR 116, G6PD 24.6 high  Speciality Comments: No specialty comments available.  Procedures:  No procedures performed Allergies: Ace inhibitors   Assessment / Plan:     Visit Diagnoses: Rheumatoid arthritis involving multiple sites with positive rheumatoid factor (HCC) -with nodulosis and severe inflammatory arthritis with RF, positive anti-CCP, positive ANA, elevated ESR.  Detailed counseling guarding rheumatoid arthritis was provided.  Different treatment options  and their side effects were discussed.She needs aggressive immunosuppressive agent.  With her history of sepsis in the past she is hesitant to go on immunosuppressive agent.  Indications side effects contraindications of different medications were discussed.  Handout was given on leflunomide.  She was in agreement to proceed with leflunomide.  We will start her on 10 mg p.o. daily.  If tolerated will increase the dose later.  She does not have very many options due to low GFR and congestive heart failure.  If she has an adequate to leflunomide we may consider Orencia or Jak inhibitors.  Medication counseling:   Baseline Immunosuppressant Therapy Labs     Hepatitis Latest Ref Rng & Units 10/25/2015  Hep C Ab NEGATIVE NEGATIVE    Lab Results  Component Value Date   HIV NON REACTIVE 01/18/2020   HIV Non Reactive 08/06/2018   HIV Non Reactive 04/29/2017       Serum Protein Electrophoresis Latest Ref Rng & Units 05/03/2020  Total Protein 6.5 - 8.1 g/dL 7.2  Albumin 2.9 - 4.4 g/dL -    Lab Results  Component Value Date   G6PDH 24.6 (H) 06/19/2020    No results found for: TPMT   Patient was counseled on the purpose, proper use, and adverse effects of leflunomide including risk of infection, nausea/diarrhea/weight loss, increase in blood pressure, rash, hair loss, tingling in the hands and feet, and signs and symptoms of interstitial lung disease.   Also counseled on Black Box warning of liver injury and importance of avoiding alcohol while on therapy. Discussed that there is the possibility of an increased risk of malignancy but it is not well understood if this increased risk is due to the medication or the disease state.  Counseled patient to avoid live vaccines. Recommend annual influenza, Pneumovax 23, Prevnar 13, and Shingrix as indicated.   Discussed  the importance of frequent monitoring of liver function and blood count.  Standing orders placed.Provided patient with educational  materials on leflunomide and answered all questions.  Patient consented to [place] use, and consent will be uploaded into the media tab.   Patient dose will be 10 mg p.o. daily.  Positive ANA (antinuclear antibody) - ENA negative, C3-C4 normal.  Plan anticardiolipin, beta-2 GP 1 and lupus anticoagulant with her next a standing orders.  Raynaud's disease without gangrene-most likely related to smoking.  High risk medication use -she will start leflunomide.  We will check labs in 2 weeks and then 2 months.  Hyperuricemia-she has no clinical features of gout.  Low purine diet was discussed.  Status post total knee replacement, right-she has chronic discomfort.  Primary osteoarthritis of left knee-she continues to have pain and discomfort.  She has some warmth and swelling in her knee joints.  Stage 3b chronic kidney disease-she has low GFR.  Chronic systolic congestive heart failure (HCC)  CAD in native artery, s/p cardiac cath with non obstructive CAD  Cerebrovascular accident (CVA) due to embolism of left cerebellar artery (HCC)  Chronic atrial fibrillation  Chronic anticoagulation  Essential hypertension  NICM (nonischemic cardiomyopathy) ([place])  Diverticulosis of colon with hemorrhage  Bacteremia due to Gram-negative bacteria-patient does not recall when she had sepsis.  Vitamin D deficiency  Patient has received both COVID-19 vaccinations.  Have advised her to get booster.  I also advised in case she develops COVID-19 infection she may be qualified to receive monoclonal antibody infusion.  She should contact her PCP or our office.  Use of mask, social distancing and hand hygiene was emphasized.  Orders: No orders of the defined types were placed in this encounter.  No orders of the defined types were placed in this encounter.   Follow-Up Instructions: Return in 2 months (on 09/16/2020) for Rheumatoid arthritis, Osteoarthritis.   Bo Merino, MD  Note - This  record has been created using Editor, commissioning.  Chart creation errors have been sought, but may not always  have been located. Such creation errors do not reflect on  the standard of medical care.

## 2020-07-09 ENCOUNTER — Other Ambulatory Visit (INDEPENDENT_AMBULATORY_CARE_PROVIDER_SITE_OTHER): Payer: Medicare HMO

## 2020-07-09 ENCOUNTER — Ambulatory Visit: Payer: Medicare HMO | Admitting: Gastroenterology

## 2020-07-09 ENCOUNTER — Encounter: Payer: Self-pay | Admitting: Gastroenterology

## 2020-07-09 VITALS — BP 86/54 | HR 87 | Ht 64.0 in | Wt 179.0 lb

## 2020-07-09 DIAGNOSIS — D649 Anemia, unspecified: Secondary | ICD-10-CM | POA: Diagnosis not present

## 2020-07-09 LAB — CBC WITH DIFFERENTIAL/PLATELET
Basophils Absolute: 0 10*3/uL (ref 0.0–0.1)
Basophils Relative: 0.4 % (ref 0.0–3.0)
Eosinophils Absolute: 0.2 10*3/uL (ref 0.0–0.7)
Eosinophils Relative: 1.9 % (ref 0.0–5.0)
HCT: 33.3 % — ABNORMAL LOW (ref 36.0–46.0)
Hemoglobin: 10.7 g/dL — ABNORMAL LOW (ref 12.0–15.0)
Lymphocytes Relative: 12.8 % (ref 12.0–46.0)
Lymphs Abs: 1.4 10*3/uL (ref 0.7–4.0)
MCHC: 32.2 g/dL (ref 30.0–36.0)
MCV: 88.1 fl (ref 78.0–100.0)
Monocytes Absolute: 0.8 10*3/uL (ref 0.1–1.0)
Monocytes Relative: 6.7 % (ref 3.0–12.0)
Neutro Abs: 8.8 10*3/uL — ABNORMAL HIGH (ref 1.4–7.7)
Neutrophils Relative %: 78.2 % — ABNORMAL HIGH (ref 43.0–77.0)
Platelets: 277 10*3/uL (ref 150.0–400.0)
RBC: 3.78 Mil/uL — ABNORMAL LOW (ref 3.87–5.11)
RDW: 16.1 % — ABNORMAL HIGH (ref 11.5–15.5)
WBC: 11.2 10*3/uL — ABNORMAL HIGH (ref 4.0–10.5)

## 2020-07-09 NOTE — Progress Notes (Signed)
    History of Present Illness: This is a 69 year old female returning following hospitalization for a GI bleed in June.  She was found to have bleeding lesions in D2-D3 felt to be AVMs which were treated with APC.  A duodenal diverticulum was also noted in the same area but did not appear to be the source of bleeding.  She was hospitalized in February with a lower GI bleed secondary to diverticulosis.  Following her hospitalization in June Eliquis was not restarted.  She has not noted any hematochezia, melena, constipation, diarrhea, abdominal pain.  Hemoglobin was 10.4 on July 8 and her hemoglobin today was 10.7.  Current Medications, Allergies, Past Medical History, Past Surgical History, Family History and Social History were reviewed in [employer] record.   Physical Exam: General: Well developed, well nourished, no acute distress Head: Normocephalic and atraumatic Eyes:  sclerae anicteric, EOMI Ears: Normal auditory acuity Mouth: Not examined, mask on during Covid-19 pandemic Lungs: Clear throughout to auscultation Heart: Regular rate and rhythm; no murmurs, rubs or bruits Abdomen: Soft, non tender and non distended. No masses, hepatosplenomegaly or hernias noted. Normal Bowel sounds Rectal: Not done Musculoskeletal: Symmetrical with no gross deformities  Pulses:  Normal pulses noted Extremities: No clubbing, cyanosis, edema or deformities noted Neurological: Alert oriented x 4, grossly nonfocal Psychological:  Alert and cooperative. Normal mood and affect   Assessment and Recommendations:  1.  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  Anticoagulation was not restarted following her hospitalization in June.  GI follow-up as needed.  2.  Diverticular bleed in February 2021.  GI follow-up as needed.  3.  Anemia.  Component was related to acute blood loss however she may have a chronic component. Fe SO4 325 mg po  bid for 2 months and follow up with PCP.

## 2020-07-09 NOTE — Patient Instructions (Signed)
Your provider has requested that you go to the basement level for lab work before leaving today. Press "B" on the elevator. The lab is located at the first door on the left as you exit the elevator.  Thank you for choosing me and [facility].  Malcolm T. Stark, Jr., MD., FACG  

## 2020-07-11 ENCOUNTER — Other Ambulatory Visit: Payer: Self-pay | Admitting: Internal Medicine

## 2020-07-11 DIAGNOSIS — M25661 Stiffness of right knee, not elsewhere classified: Secondary | ICD-10-CM | POA: Diagnosis not present

## 2020-07-11 DIAGNOSIS — M25561 Pain in right knee: Secondary | ICD-10-CM | POA: Diagnosis not present

## 2020-07-11 DIAGNOSIS — R262 Difficulty in walking, not elsewhere classified: Secondary | ICD-10-CM | POA: Diagnosis not present

## 2020-07-11 DIAGNOSIS — M6281 Muscle weakness (generalized): Secondary | ICD-10-CM | POA: Diagnosis not present

## 2020-07-11 NOTE — Telephone Encounter (Signed)
Please refill as per office routine med refill policy (all routine meds refilled for 3 mo or monthly per pt preference up to one year from last visit, then month to month grace period for 3 mo, then further med refills will have to be denied)  

## 2020-07-12 DIAGNOSIS — R339 Retention of urine, unspecified: Secondary | ICD-10-CM | POA: Diagnosis not present

## 2020-07-16 DIAGNOSIS — R262 Difficulty in walking, not elsewhere classified: Secondary | ICD-10-CM | POA: Diagnosis not present

## 2020-07-16 DIAGNOSIS — M6281 Muscle weakness (generalized): Secondary | ICD-10-CM | POA: Diagnosis not present

## 2020-07-16 DIAGNOSIS — M25661 Stiffness of right knee, not elsewhere classified: Secondary | ICD-10-CM | POA: Diagnosis not present

## 2020-07-16 DIAGNOSIS — M25561 Pain in right knee: Secondary | ICD-10-CM | POA: Diagnosis not present

## 2020-07-17 ENCOUNTER — Other Ambulatory Visit: Payer: Self-pay

## 2020-07-17 ENCOUNTER — Ambulatory Visit: Payer: Medicare HMO | Admitting: Rheumatology

## 2020-07-17 ENCOUNTER — Encounter: Payer: Self-pay | Admitting: Rheumatology

## 2020-07-17 VITALS — BP 109/72 | HR 90 | Resp 16 | Ht 64.0 in | Wt 179.6 lb

## 2020-07-17 DIAGNOSIS — I7300 Raynaud's syndrome without gangrene: Secondary | ICD-10-CM | POA: Diagnosis not present

## 2020-07-17 DIAGNOSIS — I2510 Atherosclerotic heart disease of native coronary artery without angina pectoris: Secondary | ICD-10-CM

## 2020-07-17 DIAGNOSIS — I63442 Cerebral infarction due to embolism of left cerebellar artery: Secondary | ICD-10-CM

## 2020-07-17 DIAGNOSIS — I428 Other cardiomyopathies: Secondary | ICD-10-CM

## 2020-07-17 DIAGNOSIS — Z79899 Other long term (current) drug therapy: Secondary | ICD-10-CM | POA: Diagnosis not present

## 2020-07-17 DIAGNOSIS — E790 Hyperuricemia without signs of inflammatory arthritis and tophaceous disease: Secondary | ICD-10-CM

## 2020-07-17 DIAGNOSIS — R768 Other specified abnormal immunological findings in serum: Secondary | ICD-10-CM | POA: Diagnosis not present

## 2020-07-17 DIAGNOSIS — Z96651 Presence of right artificial knee joint: Secondary | ICD-10-CM | POA: Diagnosis not present

## 2020-07-17 DIAGNOSIS — I10 Essential (primary) hypertension: Secondary | ICD-10-CM

## 2020-07-17 DIAGNOSIS — I482 Chronic atrial fibrillation, unspecified: Secondary | ICD-10-CM

## 2020-07-17 DIAGNOSIS — M0579 Rheumatoid arthritis with rheumatoid factor of multiple sites without organ or systems involvement: Secondary | ICD-10-CM | POA: Diagnosis not present

## 2020-07-17 DIAGNOSIS — N1832 Chronic kidney disease, stage 3b: Secondary | ICD-10-CM

## 2020-07-17 DIAGNOSIS — E559 Vitamin D deficiency, unspecified: Secondary | ICD-10-CM

## 2020-07-17 DIAGNOSIS — K5731 Diverticulosis of large intestine without perforation or abscess with bleeding: Secondary | ICD-10-CM

## 2020-07-17 DIAGNOSIS — M1712 Unilateral primary osteoarthritis, left knee: Secondary | ICD-10-CM

## 2020-07-17 DIAGNOSIS — I5022 Chronic systolic (congestive) heart failure: Secondary | ICD-10-CM

## 2020-07-17 DIAGNOSIS — R7881 Bacteremia: Secondary | ICD-10-CM

## 2020-07-17 DIAGNOSIS — R7689 Other specified abnormal immunological findings in serum: Secondary | ICD-10-CM

## 2020-07-17 DIAGNOSIS — Z7901 Long term (current) use of anticoagulants: Secondary | ICD-10-CM

## 2020-07-17 MED ORDER — LEFLUNOMIDE 10 MG PO TABS: 10.0000 mg | ORAL_TABLET | Freq: Every day | ORAL | 1 refills | Status: DC

## 2020-07-17 NOTE — Patient Instructions (Signed)
COVID-19 vaccine recommendations:   COVID-19 vaccine is recommended for everyone (unless you are allergic to a vaccine component), even if you are on a medication that suppresses your immune system.   If you are on Methotrexate, Cellcept (mycophenolate), Rinvoq, Morrie Sheldon, and Olumiant- hold the medication for 1 week after each vaccine. Hold Methotrexate for 2 weeks after the single dose COVID-19 vaccine.   If you are on Orencia subcutaneous injection - hold medication one week prior to and one week after the first COVID-19 vaccine dose (only).   If you are on Orencia IV infusions- time vaccination administration so that the first COVID-19 vaccination will occur four weeks after the infusion and postpone the subsequent infusion by one week.   If you are on Cyclophosphamide or Rituxan infusions please contact your doctor prior to receiving the COVID-19 vaccine.   Do not take Tylenol or any anti-inflammatory medications (NSAIDs) 24 hours prior to the COVID-19 vaccination.   There is no direct evidence about the efficacy of the COVID-19 vaccine in individuals who are on medications that suppress the immune system.   Even if you are fully vaccinated, and you are on any medications that suppress your immune system, please continue to wear a mask, maintain at least six feet social distance and practice hand hygiene.   If you develop a COVID-19 infection, please contact your PCP or our office to determine if you need antibody infusion.  The booster vaccine is now available for immunocompromised patients. It is advised that if you had Pfizer vaccine you should get Coca-Cola booster.  If you had a Moderna vaccine then you should get a Moderna booster. Johnson and Wynetta Emery does not have a booster vaccine at this time.  Please see the following web sites for updated information.    https://www.rheumatology.org/Portals/0/Files/COVID-19-Vaccination-Patient-Resources.pdf  https://www.rheumatology.org/About-Us/Newsroom/Press-Releases/ID/1159 Leflunomide tablets What is this medicine? LEFLUNOMIDE (le FLOO na mide) is for rheumatoid arthritis. This medicine may be used for other purposes; ask your health care provider or pharmacist if you have questions. COMMON BRAND NAME(S): Arava What should I tell my health care provider before I take this medicine? They need to know if you have any of these conditions:  diabetes  have a fever or infection  high blood pressure  immune system problems  kidney disease  liver disease  low blood cell counts, like low white cell, platelet, or red cell counts  lung or breathing disease, like asthma  recently received or scheduled to receive a vaccine  receiving treatment for cancer  skin conditions or sensitivity  tingling of the fingers or toes, or other nerve disorder  tuberculosis  an unusual or allergic reaction to leflunomide, teriflunomide, other medicines, food, dyes, or preservatives  pregnant or trying to get pregnant  breast-feeding How should I use this medicine? Take this medicine by mouth with a full glass of water. Follow the directions on the prescription label. Take your medicine at regular intervals. Do not take your medicine more often than directed. Do not stop taking except on your doctor's advice. Talk to your pediatrician regarding the use of this medicine in children. Special care may be needed. Overdosage: If you think you have taken too much of this medicine contact a poison control center or emergency room at once. NOTE: This medicine is only for you. Do not share this medicine with others. What if I miss a dose? If you miss a dose, take it as soon as you can. If it is almost time for your next dose, take  only that dose. Do not take double or extra doses. What may interact with this  medicine? Do not take this medicine with any of the following medications:  teriflunomide This medicine may also interact with the following medications:  alosetron  birth control pills  caffeine  cefaclor  certain medicines for diabetes like nateglinide, repaglinide, rosiglitazone, pioglitazone  certain medicines for high cholesterol like atorvastatin, pravastatin, rosuvastatin, simvastatin  charcoal  cholestyramine  ciprofloxacin  duloxetine  furosemide  ketoprofen  live virus vaccines  medicines that increase your risk for infection  methotrexate  mitoxantrone  paclitaxel  penicillin  theophylline  tizanidine  warfarin This list may not describe all possible interactions. Give your health care provider a list of all the medicines, herbs, non-prescription drugs, or dietary supplements you use. Also tell them if you smoke, drink alcohol, or use illegal drugs. Some items may interact with your medicine. What should I watch for while using this medicine? Visit your health care provider for regular checks on your progress. Tell your doctor or health care provider if your symptoms do not start to get better or if they get worse. You may need blood work done while you are taking this medicine. This medicine may cause serious skin reactions. They can happen weeks to months after starting the medicine. Contact your health care provider right away if you notice fevers or flu-like symptoms with a rash. The rash may be red or purple and then turn into blisters or peeling of the skin. Or, you might notice a red rash with swelling of the face, lips or lymph nodes in your neck or under your arms. This medicine may stay in your body for up to 2 years after your last dose. Tell your doctor about any unusual side effects or symptoms. A medicine can be given to help lower your blood levels of this medicine more quickly. Women must use effective birth control with this medicine.  There is a potential for serious side effects to an unborn child. Do not become pregnant while taking this medicine. Inform your doctor if you wish to become pregnant. This medicine remains in your blood after you stop taking it. You must continue using effective birth control until the blood levels have been checked and they are low enough. A medicine can be given to help lower your blood levels of this medicine more quickly. Immediately talk to your doctor if you think you may be pregnant. You may need a pregnancy test. Talk to your health care provider or pharmacist for more information. You should not receive certain vaccines during your treatment and for a certain time after your treatment with this medication ends. Talk to your health care provider for more information. What side effects may I notice from receiving this medicine? Side effects that you should report to your doctor or health care professional as soon as possible:  allergic reactions like skin rash, itching or hives, swelling of the face, lips, or tongue  breathing problems  cough  increased blood pressure  low blood counts - this medicine may decrease the number of white blood cells and platelets. You may be at increased risk for infections and bleeding.  pain, tingling, numbness in the hands or feet  rash, fever, and swollen lymph nodes  redness, blistering, peeing or loosening of the skin, including inside the mouth  signs of decreased platelets or bleeding - bruising, pinpoint red spots on the skin, black, tarry stools, blood in urine  signs of infection -  fever or chills, cough, sore throat, pain or trouble passing urine  signs and symptoms of liver injury like dark yellow or brown urine; general ill feeling or flu-like symptoms; light-colored stools; loss of appetite; nausea; right upper belly pain; unusually weak or tired; yellowing of the eyes or skin  trouble passing urine or change in the amount of  urine  vomiting Side effects that usually do not require medical attention (report to your doctor or health care professional if they continue or are bothersome):  diarrhea  hair thinning or loss  headache  nausea  tiredness This list may not describe all possible side effects. Call your doctor for medical advice about side effects. You may report side effects to FDA at 1-800-FDA-1088. Where should I keep my medicine? Keep out of the reach of children. Store at room temperature between 15 and 30 degrees C (59 and 86 degrees F). Protect from moisture and light. Throw away any unused medicine after the expiration date. NOTE: This sheet is a summary. It may not cover all possible information. If you have questions about this medicine, talk to your doctor, pharmacist, or health care provider.  2020 Elsevier/Gold Standard (2019-02-11 15:06:48)  Standing Labs We placed an order today for your standing lab work.   Please have your standing labs drawn in 2 weeks and then every 2 months  If possible, please have your labs drawn 2 weeks prior to your appointment so that the provider can discuss your results at your appointment.  We have open lab daily Monday through Thursday from 8:30-12:30 PM and 1:30-4:30 PM and Friday from 8:30-12:30 PM and 1:30-4:00 PM at the office of Dr. Bo Merino, [facility].   Please be advised, patients with office appointments requiring lab work will take precedents over walk-in lab work.  If possible, please come for your lab work on Monday and Friday afternoons, as you may experience shorter wait times. The office is located at [address], [place], [place],  [address] No appointment is necessary.   Labs are drawn by [facility]. Please bring your co-pay at the time of your lab draw.  You may receive a bill from [facility] for your lab work.  If you wish to have your labs drawn at another location, please call the office 24 hours in  advance to send orders.  If you have any questions regarding directions or hours of operation,  please call [phone].   As a reminder, please drink plenty of water prior to coming for your lab work. Thanks!

## 2020-07-19 DIAGNOSIS — M25561 Pain in right knee: Secondary | ICD-10-CM | POA: Diagnosis not present

## 2020-07-19 DIAGNOSIS — R262 Difficulty in walking, not elsewhere classified: Secondary | ICD-10-CM | POA: Diagnosis not present

## 2020-07-19 DIAGNOSIS — M25661 Stiffness of right knee, not elsewhere classified: Secondary | ICD-10-CM | POA: Diagnosis not present

## 2020-07-19 DIAGNOSIS — M6281 Muscle weakness (generalized): Secondary | ICD-10-CM | POA: Diagnosis not present

## 2020-07-24 DIAGNOSIS — M25661 Stiffness of right knee, not elsewhere classified: Secondary | ICD-10-CM | POA: Diagnosis not present

## 2020-07-24 DIAGNOSIS — M25561 Pain in right knee: Secondary | ICD-10-CM | POA: Diagnosis not present

## 2020-07-24 DIAGNOSIS — R262 Difficulty in walking, not elsewhere classified: Secondary | ICD-10-CM | POA: Diagnosis not present

## 2020-07-24 DIAGNOSIS — M6281 Muscle weakness (generalized): Secondary | ICD-10-CM | POA: Diagnosis not present

## 2020-07-26 DIAGNOSIS — M25561 Pain in right knee: Secondary | ICD-10-CM | POA: Diagnosis not present

## 2020-07-26 DIAGNOSIS — M25661 Stiffness of right knee, not elsewhere classified: Secondary | ICD-10-CM | POA: Diagnosis not present

## 2020-07-26 DIAGNOSIS — M6281 Muscle weakness (generalized): Secondary | ICD-10-CM | POA: Diagnosis not present

## 2020-07-26 DIAGNOSIS — R262 Difficulty in walking, not elsewhere classified: Secondary | ICD-10-CM | POA: Diagnosis not present

## 2020-08-01 DIAGNOSIS — M25561 Pain in right knee: Secondary | ICD-10-CM | POA: Diagnosis not present

## 2020-08-01 DIAGNOSIS — M25661 Stiffness of right knee, not elsewhere classified: Secondary | ICD-10-CM | POA: Diagnosis not present

## 2020-08-01 DIAGNOSIS — R262 Difficulty in walking, not elsewhere classified: Secondary | ICD-10-CM | POA: Diagnosis not present

## 2020-08-01 DIAGNOSIS — M6281 Muscle weakness (generalized): Secondary | ICD-10-CM | POA: Diagnosis not present

## 2020-08-07 DIAGNOSIS — M25561 Pain in right knee: Secondary | ICD-10-CM | POA: Diagnosis not present

## 2020-08-07 DIAGNOSIS — M6281 Muscle weakness (generalized): Secondary | ICD-10-CM | POA: Diagnosis not present

## 2020-08-07 DIAGNOSIS — R262 Difficulty in walking, not elsewhere classified: Secondary | ICD-10-CM | POA: Diagnosis not present

## 2020-08-07 DIAGNOSIS — M25661 Stiffness of right knee, not elsewhere classified: Secondary | ICD-10-CM | POA: Diagnosis not present

## 2020-08-08 DIAGNOSIS — N183 Chronic kidney disease, stage 3 unspecified: Secondary | ICD-10-CM | POA: Diagnosis not present

## 2020-08-08 DIAGNOSIS — N189 Chronic kidney disease, unspecified: Secondary | ICD-10-CM | POA: Diagnosis not present

## 2020-08-09 ENCOUNTER — Other Ambulatory Visit: Payer: Self-pay

## 2020-08-09 DIAGNOSIS — Z79899 Other long term (current) drug therapy: Secondary | ICD-10-CM

## 2020-08-09 DIAGNOSIS — R768 Other specified abnormal immunological findings in serum: Secondary | ICD-10-CM | POA: Diagnosis not present

## 2020-08-09 DIAGNOSIS — M6281 Muscle weakness (generalized): Secondary | ICD-10-CM | POA: Diagnosis not present

## 2020-08-09 DIAGNOSIS — M0579 Rheumatoid arthritis with rheumatoid factor of multiple sites without organ or systems involvement: Secondary | ICD-10-CM | POA: Diagnosis not present

## 2020-08-09 DIAGNOSIS — M1711 Unilateral primary osteoarthritis, right knee: Secondary | ICD-10-CM | POA: Diagnosis not present

## 2020-08-09 DIAGNOSIS — R262 Difficulty in walking, not elsewhere classified: Secondary | ICD-10-CM | POA: Diagnosis not present

## 2020-08-09 DIAGNOSIS — I7300 Raynaud's syndrome without gangrene: Secondary | ICD-10-CM | POA: Diagnosis not present

## 2020-08-09 DIAGNOSIS — M25661 Stiffness of right knee, not elsewhere classified: Secondary | ICD-10-CM | POA: Diagnosis not present

## 2020-08-11 ENCOUNTER — Other Ambulatory Visit (HOSPITAL_COMMUNITY): Payer: Self-pay | Admitting: Internal Medicine

## 2020-08-14 DIAGNOSIS — R338 Other retention of urine: Secondary | ICD-10-CM | POA: Diagnosis not present

## 2020-08-14 LAB — CBC WITH DIFFERENTIAL/PLATELET
Absolute Monocytes: 429 cells/uL (ref 200–950)
Basophils Absolute: 40 cells/uL (ref 0–200)
Basophils Relative: 0.6 %
Eosinophils Absolute: 261 cells/uL (ref 15–500)
Eosinophils Relative: 3.9 %
HCT: 37 % (ref 35.0–45.0)
Hemoglobin: 11.5 g/dL — ABNORMAL LOW (ref 11.7–15.5)
Lymphs Abs: 918 cells/uL (ref 850–3900)
MCH: 27.6 pg (ref 27.0–33.0)
MCHC: 31.1 g/dL — ABNORMAL LOW (ref 32.0–36.0)
MCV: 88.9 fL (ref 80.0–100.0)
MPV: 11 fL (ref 7.5–12.5)
Monocytes Relative: 6.4 %
Neutro Abs: 5052 cells/uL (ref 1500–7800)
Neutrophils Relative %: 75.4 %
Platelets: 273 10*3/uL (ref 140–400)
RBC: 4.16 10*6/uL (ref 3.80–5.10)
RDW: 14.6 % (ref 11.0–15.0)
Total Lymphocyte: 13.7 %
WBC: 6.7 10*3/uL (ref 3.8–10.8)

## 2020-08-14 LAB — COMPLETE METABOLIC PANEL WITH GFR
AG Ratio: 1 (calc) (ref 1.0–2.5)
ALT: 3 U/L — ABNORMAL LOW (ref 6–29)
AST: 13 U/L (ref 10–35)
Albumin: 3.4 g/dL — ABNORMAL LOW (ref 3.6–5.1)
Alkaline phosphatase (APISO): 63 U/L (ref 37–153)
BUN/Creatinine Ratio: 14 (calc) (ref 6–22)
BUN: 22 mg/dL (ref 7–25)
CO2: 29 mmol/L (ref 20–32)
Calcium: 9.3 mg/dL (ref 8.6–10.4)
Chloride: 97 mmol/L — ABNORMAL LOW (ref 98–110)
Creat: 1.56 mg/dL — ABNORMAL HIGH (ref 0.50–0.99)
GFR, Est African American: 39 mL/min/{1.73_m2} — ABNORMAL LOW (ref >=60)
GFR, Est Non African American: 34 mL/min/{1.73_m2} — ABNORMAL LOW (ref >=60)
Globulin: 3.5 g/dL (calc) (ref 1.9–3.7)
Glucose, Bld: 81 mg/dL (ref 65–99)
Potassium: 3.7 mmol/L (ref 3.5–5.3)
Sodium: 139 mmol/L (ref 135–146)
Total Bilirubin: 0.7 mg/dL (ref 0.2–1.2)
Total Protein: 6.9 g/dL (ref 6.1–8.1)

## 2020-08-14 LAB — LUPUS ANTICOAGULANT EVAL W/ REFLEX
PTT-LA Screen: 30 s (ref <=40)
dRVVT: 43 s (ref <=45)

## 2020-08-14 LAB — BETA-2 GLYCOPROTEIN ANTIBODIES
Beta-2 Glyco 1 IgA: 3.3 U/mL
Beta-2 Glyco 1 IgM: <2 U/mL
Beta-2 Glyco I IgG: <2 U/mL

## 2020-08-14 LAB — CARDIOLIPIN ANTIBODIES, IGG, IGM, IGA
Anticardiolipin IgA: 3.6 APL-U/mL
Anticardiolipin IgG: <2 GPL-U/mL
Anticardiolipin IgM: <2 MPL-U/mL

## 2020-08-15 ENCOUNTER — Telehealth: Payer: Self-pay | Admitting: *Deleted

## 2020-08-15 NOTE — Telephone Encounter (Signed)
Schedule mobile mammogram 9-27 [facility] at [facility]

## 2020-08-15 NOTE — Telephone Encounter (Signed)
08/15/2020 - Patricia Wagner -  PATIENT RETURNED YOUR CALL REGARDING THE MOBILE MAMMOGRAM. BEST PHONE [phone] (CELL) [place]

## 2020-08-16 ENCOUNTER — Other Ambulatory Visit: Payer: Self-pay | Admitting: Registered Nurse

## 2020-08-16 DIAGNOSIS — R262 Difficulty in walking, not elsewhere classified: Secondary | ICD-10-CM | POA: Diagnosis not present

## 2020-08-16 DIAGNOSIS — M25561 Pain in right knee: Secondary | ICD-10-CM | POA: Diagnosis not present

## 2020-08-16 DIAGNOSIS — M25661 Stiffness of right knee, not elsewhere classified: Secondary | ICD-10-CM | POA: Diagnosis not present

## 2020-08-16 DIAGNOSIS — Z1231 Encounter for screening mammogram for malignant neoplasm of breast: Secondary | ICD-10-CM

## 2020-08-16 DIAGNOSIS — M6281 Muscle weakness (generalized): Secondary | ICD-10-CM | POA: Diagnosis not present

## 2020-08-20 ENCOUNTER — Other Ambulatory Visit: Payer: Self-pay | Admitting: Rheumatology

## 2020-08-20 ENCOUNTER — Ambulatory Visit: Payer: Medicare HMO

## 2020-08-20 ENCOUNTER — Encounter: Payer: Self-pay | Admitting: Registered Nurse

## 2020-08-20 NOTE — Telephone Encounter (Signed)
Last Visit: 07/17/2020  Next Visit: 09/18/2020 Labs: 08/09/2020 Labs are stable. Creatinine has improved slightly and GFR. Hgb is improving-11.5   Current Dose per office note on 07/17/2020: Arava 10 mg p.o. daily. Dx: Rheumatoid arthritis involving multiple sites with positive rheumatoid factor   Okay to refill Arava?

## 2020-08-20 NOTE — Progress Notes (Signed)
Established Patient Office Visit  Subjective:  Patient ID: Patricia Wagner, female    DOB: [birth year]  Age: 69 y.o. MRN: [id number]  CC:  Chief Complaint  Patient presents with  . Hospitalization Follow-up    Patient states she had a virus and gave her a IV and sent her home. Patient started bleeding in stomach and blood in stool and was in the hospital for a week    HPI KELISE KUCH presents for hospital follow up   GI bleed - noted gross blood in stool, was admitted for a week. Stabilized and dc'd  At this time feeling well. Feels she has been able to get rehydrated, no symptoms of anemia Knows to avoid NSaids and keep a bland diet for the next few weeks.  No lightheadedness, dizziness, shob, doe, headaches, ongoing GI bleeding, coffee ground emesis, or melena.   Past Medical History:  Diagnosis Date  . Acute blood loss anemia 03/05/2018  . Acute hypercapnic respiratory failure ([place]) 04/30/2017  . Acute renal failure superimposed on chronic kidney disease ([place]) 10/20/2016  . Arthritis    hands, knees  . Atrial fibrillation ([place])    a. s/p multiple cardioversions; failed tikosyn/sotalol.  . Bradycardia 11/20/2016  . CAD in native artery, s/p cardiac cath with non obstructive CAD 10/24/2016  . Cerebrovascular accident (CVA) due to embolism of left cerebellar artery ([place])   . CHF (congestive heart failure) ([place])   . Chronic anticoagulation 03/15/2018  . Chronic atrial fibrillation ([place]) 03/05/2018  . Chronic systolic (congestive) heart failure ([place]) 03/05/2018  . CKD (chronic kidney disease) stage 3, GFR 30-59 ml/min   . Coagulopathy ([place]) 03/05/2018  . DIVERTICULITIS, HX OF 07/25/2007  . Diverticulosis of colon with hemorrhage 07/22/2007   Qualifier: Diagnosis of  By: [name]    . DIVERTICULOSIS, COLON 07/22/2007  . Dysuria 07/21/2017  . Edema, peripheral    a. chronic BLE edema, R>L. Prior trauma from dog attack and accident.  . Essential hypertension 07/22/2007    Qualifier: Diagnosis of  By: [name]    . History of kidney stones   . HLD (hyperlipidemia) 02/03/2008   Qualifier: Diagnosis of  By: [name], [name]   . HYPERLIPIDEMIA 02/03/2008  . Hypersomnia    declines w/u  . Hypertension   . Hypotension (arterial) 04/30/2017  . MENOPAUSAL DISORDER 01/09/2011  . Morbid obesity ([place]) 07/22/2007  . NICM (nonischemic cardiomyopathy) ([place]) 10/24/2016  . PAF (paroxysmal atrial fibrillation) ([place])   . Presence of indwelling urinary catheter    supra pubic cath  . Raynaud's syndrome 07/22/2007  . Stroke ([place]) 2017  . Suspected sleep apnea 05/20/2017  . THYROID NODULE, RIGHT 01/04/2010  . VITAMIN D DEFICIENCY 01/09/2011   Qualifier: Diagnosis of  By: [name], [name]     Past Surgical History:  Procedure Laterality Date  . ATRIAL FIBRILLATION ABLATION N/A 10/13/2019   Procedure: ATRIAL FIBRILLATION ABLATION;  Surgeon: [name], MD;  Location: [facility];  Service: Cardiovascular;  Laterality: N/A;  . CARDIAC CATHETERIZATION N/A 10/23/2016   Procedure: Left Heart Cath and Coronary Angiography;  Surgeon: [name], MD;  Location: [facility];  Service: Cardiovascular;  Laterality: N/A;  . CARDIOVERSION N/A 10/31/2016   Procedure: CARDIOVERSION;  Surgeon: [name], MD;  Location: [facility] ENDOSCOPY;  Service: Cardiovascular;  Laterality: N/A;  . CARDIOVERSION N/A 11/03/2016   Procedure: CARDIOVERSION;  Surgeon: [name], MD;  Location: [facility];  Service: Cardiovascular;  Laterality: N/A;  . CARDIOVERSION N/A 11/18/2016   Procedure: CARDIOVERSION;  Surgeon: [name], MD;  Location: [facility];  Service: Cardiovascular;  Laterality: N/A;  . CARDIOVERSION N/A 03/25/2017   Procedure: CARDIOVERSION;  Surgeon: [name], MD;  Location: [facility] ENDOSCOPY;  Service: Cardiovascular;  Laterality: N/A;  . CARDIOVERSION N/A 05/06/2017   Procedure: CARDIOVERSION;  Surgeon: [name], MD;  Location: [facility]  ENDOSCOPY;  Service: Cardiovascular;  Laterality: N/A;  . CARDIOVERSION N/A 05/12/2017   Procedure: CARDIOVERSION;  Surgeon: [name], MD;  Location: [facility] ENDOSCOPY;  Service: Cardiovascular;  Laterality: N/A;  . COLONOSCOPY N/A 12/04/2017   Procedure: COLONOSCOPY;  Surgeon: [name], MD;  Location: [facility] ENDOSCOPY;  Service: Endoscopy;  Laterality: N/A;  . COLONOSCOPY    . COLONOSCOPY W/ POLYPECTOMY  02/2011   pan diverticulosis.  tubular adenoma without dysplasia on 5 mm sigmoid polyp.  Dr Fuller Plan.    . ESOPHAGOGASTRODUODENOSCOPY (EGD) WITH PROPOFOL N/A 05/16/2020   Procedure: ESOPHAGOGASTRODUODENOSCOPY (EGD) WITH PROPOFOL;  Surgeon: Milus Banister, MD;  Location: [facility] ENDOSCOPY;  Service: Endoscopy;  Laterality: N/A;  . FLEXIBLE SIGMOIDOSCOPY N/A 01/18/2020   Procedure: FLEXIBLE SIGMOIDOSCOPY;  Surgeon: [name], DO;  Location: [place];  Service: Gastroenterology;  Laterality: N/A;  . HEMOSTASIS CLIP PLACEMENT  01/18/2020   Procedure: HEMOSTASIS CLIP PLACEMENT;  Surgeon: [name], DO;  Location: [place];  Service: Gastroenterology;;  . HOT HEMOSTASIS N/A 05/16/2020   Procedure: HOT HEMOSTASIS (ARGON PLASMA COAGULATION/BICAP);  Surgeon: Milus Banister, MD;  Location: [facility];  Service: Endoscopy;  Laterality: N/A;  . PARTIAL HYSTERECTOMY     1 OVARY LEFT  . SCLEROTHERAPY  01/18/2020   Procedure: SCLEROTHERAPY;  Surgeon: [name], DO;  Location: [facility] ENDOSCOPY;  Service: Gastroenterology;;  . TEE WITHOUT CARDIOVERSION N/A 10/31/2016   Procedure: TRANSESOPHAGEAL ECHOCARDIOGRAM (TEE);  Surgeon: [name], MD;  Location: [facility] ENDOSCOPY;  Service: Cardiovascular;  Laterality: N/A;  . TEE WITHOUT CARDIOVERSION N/A 03/25/2017   Procedure: TRANSESOPHAGEAL ECHOCARDIOGRAM (TEE);  Surgeon: [name], MD;  Location: [facility] ENDOSCOPY;  Service: Cardiovascular;  Laterality: N/A;  . TOTAL KNEE ARTHROPLASTY Right 04/10/2020   Procedure: TOTAL KNEE  ARTHROPLASTY;  Surgeon: Renette Butters, MD;  Location: [facility] ORS;  Service: Orthopedics;  Laterality: Right;    Family History  Problem Relation Age of Onset  . Asthma Mother   . Diabetes Father   . Heart disease Father        Died of presumed heart attack - 91s  . Lung disease Sister   . Heart disease Sister        Twin sister has heart issue, unclear what kind  . Healthy Daughter   . Thyroid disease Neg Hx   . Colon polyps Neg Hx   . Esophageal cancer Neg Hx   . Pancreatic cancer Neg Hx   . Stomach cancer Neg Hx     Social History   Socioeconomic History  . Marital status: Single    Spouse name: Not on file  . Number of children: 1  . Years of education: Not on file  . Highest education level: Not on file  Occupational History  . Occupation: Academic librarian   Tobacco Use  . Smoking status: Former Smoker    Packs/day: 0.50    Types: Cigarettes    Start date: 2019  . Smokeless tobacco: Never Used  Vaping Use  . Vaping Use: Never used  Substance and Sexual Activity  . Alcohol  use: Not Currently  . Drug use: No  . Sexual activity: Not on file  Other Topics Concern  . Not on file  Social History Narrative  . Not on file   Social Determinants of Health   Financial Resource Strain:   . Difficulty of Paying Living Expenses: Not on file  Food Insecurity:   . Worried About Charity fundraiser in the Last Year: Not on file  . Ran Out of Food in the Last Year: Not on file  Transportation Needs:   . Lack of Transportation (Medical): Not on file  . Lack of Transportation (Non-Medical): Not on file  Physical Activity:   . Days of Exercise per Week: Not on file  . Minutes of Exercise per Session: Not on file  Stress:   . Feeling of Stress : Not on file  Social Connections:   . Frequency of Communication with Friends and Family: Not on file  . Frequency of Social Gatherings with Friends and Family: Not on file  . Attends Religious Services: Not on file  . Active Member of  Clubs or Organizations: Not on file  . Attends Archivist Meetings: Not on file  . Marital Status: Not on file  Intimate Partner Violence:   . Fear of Current or Ex-Partner: Not on file  . Emotionally Abused: Not on file  . Physically Abused: Not on file  . Sexually Abused: Not on file    Outpatient Medications Prior to Visit  Medication Sig Dispense Refill  . Cholecalciferol (VITAMIN D3) 25 MCG (1000 UT) CAPS Take 1,000 Units by mouth daily.    . isosorbide mononitrate (IMDUR) 30 MG 24 hr tablet TAKE 1/2 TABLET BY MOUTH EVERY DAY 45 tablet 1  . ondansetron (ZOFRAN) 4 MG tablet Take 1 tablet (4 mg total) by mouth every 8 (eight) hours as needed for nausea or vomiting. (Patient not taking: Reported on 07/09/2020) 20 tablet 0  . pantoprazole (PROTONIX) 40 MG tablet TAKE 1 TABLET BY MOUTH EVERY DAY (Patient taking differently: Take 40 mg by mouth daily. ) 90 tablet 3  . potassium chloride SA (KLOR-CON M20) 20 MEQ tablet Take 2 tablets (40 mEq total) by mouth daily. Needs appt for further refills 180 tablet 1  . ranolazine (RANEXA) 500 MG 12 hr tablet TAKE 1 TABLET BY MOUTH TWICE A DAY (Patient taking differently: Take 500 mg by mouth 2 (two) times daily. ) 180 tablet 3  . VOLTAREN 1 % GEL Apply 1 application topically 2 (two) times daily as needed (Knee pain).     . furosemide (LASIX) 80 MG tablet TAKE 1 TABLET BY MOUTH EVERY DAY (Patient taking differently: Take 80 mg by mouth daily. ) 90 tablet 3  . ondansetron (ZOFRAN ODT) 4 MG disintegrating tablet Take 1 tablet (4 mg total) by mouth every 8 (eight) hours as needed for nausea or vomiting. (Patient not taking: Reported on 06/19/2020) 20 tablet 0  . rosuvastatin (CRESTOR) 10 MG tablet Take 1 tablet (10 mg total) by mouth daily. 90 tablet 3   No facility-administered medications prior to visit.    Allergies  Allergen Reactions  . Ace Inhibitors Palpitations    ROS Review of Systems Per hpi    Objective:    Physical  Exam Vitals and nursing note reviewed.  Constitutional:      General: She is not in acute distress.    Appearance: Normal appearance. She is normal weight. She is not ill-appearing, toxic-appearing or diaphoretic.  Cardiovascular:  Rate and Rhythm: Normal rate and regular rhythm.     Heart sounds: Normal heart sounds. No murmur heard.  No friction rub. No gallop.   Pulmonary:     Effort: Pulmonary effort is normal. No respiratory distress.     Breath sounds: Normal breath sounds. No stridor. No wheezing, rhonchi or rales.  Chest:     Chest wall: No tenderness.  Abdominal:     General: Abdomen is flat. Bowel sounds are normal. There is no distension.     Palpations: Abdomen is soft. There is no mass.     Tenderness: There is abdominal tenderness (mild, generalized). There is no right CVA tenderness, left CVA tenderness, guarding or rebound.     Hernia: No hernia is present.  Skin:    General: Skin is warm and dry.  Neurological:     General: No focal deficit present.     Mental Status: She is alert and oriented to person, place, and time. Mental status is at baseline.  Psychiatric:        Mood and Affect: Mood normal.        Behavior: Behavior normal.        Thought Content: Thought content normal.        Judgment: Judgment normal.     BP 101/66   Pulse 65   Temp 98 F (36.7 C) (Temporal)   Resp 17   Ht 5\' 5"  (1.651 m)   Wt 186 lb 3.2 oz (84.5 kg)   SpO2 95%   BMI 30.99 kg/m  Wt Readings from Last 3 Encounters:  07/17/20 179 lb 9.6 oz (81.5 kg)  07/09/20 179 lb (81.2 kg)  06/19/20 178 lb 6.4 oz (80.9 kg)     Health Maintenance Due  Topic Date Due  . MAMMOGRAM  06/11/2020  . INFLUENZA VACCINE  06/24/2020    There are no preventive care reminders to display for this patient.  Lab Results  Component Value Date   TSH 0.291 (L) 03/05/2020   Lab Results  Component Value Date   WBC 6.7 08/09/2020   HGB 11.5 (L) 08/09/2020   HCT 37.0 08/09/2020   MCV 88.9  08/09/2020   PLT 273 08/09/2020   Lab Results  Component Value Date   NA 139 08/09/2020   K 3.7 08/09/2020   CO2 29 08/09/2020   GLUCOSE 81 08/09/2020   BUN 22 08/09/2020   CREATININE 1.56 (H) 08/09/2020   BILITOT 0.7 08/09/2020   ALKPHOS 83 05/03/2020   AST 13 08/09/2020   ALT 3 (L) 08/09/2020   PROT 6.9 08/09/2020   ALBUMIN 2.8 (L) 05/03/2020   CALCIUM 9.3 08/09/2020   ANIONGAP 5 05/18/2020   GFR 28.69 (L) 04/15/2019   Lab Results  Component Value Date   CHOL 136 01/24/2020   Lab Results  Component Value Date   HDL 71 01/24/2020   Lab Results  Component Value Date   LDLCALC 51 01/24/2020   Lab Results  Component Value Date   TRIG 71 01/24/2020   Lab Results  Component Value Date   CHOLHDL 1.9 01/24/2020   Lab Results  Component Value Date   HGBA1C 4.7 (L) 03/05/2020      Assessment & Plan:   Problem List Items Addressed This Visit      Digestive   Lower GI bleed   Relevant Orders   Basic Metabolic Panel (Completed)   CBC (Completed)   Protime-INR (Completed)     Other   Anemia - Primary  Relevant Orders   Basic Metabolic Panel (Completed)   CBC (Completed)      No orders of the defined types were placed in this encounter.   Follow-up: No follow-ups on file.   PLAN  Follow up labs drawn  Discussed nonpharm for support  Return prn  Patient encouraged to call clinic with any questions, comments, or concerns.  Maximiano Coss, NP

## 2020-08-21 DIAGNOSIS — M6281 Muscle weakness (generalized): Secondary | ICD-10-CM | POA: Diagnosis not present

## 2020-08-21 DIAGNOSIS — M25661 Stiffness of right knee, not elsewhere classified: Secondary | ICD-10-CM | POA: Diagnosis not present

## 2020-08-21 DIAGNOSIS — R262 Difficulty in walking, not elsewhere classified: Secondary | ICD-10-CM | POA: Diagnosis not present

## 2020-08-21 DIAGNOSIS — M25561 Pain in right knee: Secondary | ICD-10-CM | POA: Diagnosis not present

## 2020-08-23 DIAGNOSIS — M6281 Muscle weakness (generalized): Secondary | ICD-10-CM | POA: Diagnosis not present

## 2020-08-23 DIAGNOSIS — M25561 Pain in right knee: Secondary | ICD-10-CM | POA: Diagnosis not present

## 2020-08-23 DIAGNOSIS — R262 Difficulty in walking, not elsewhere classified: Secondary | ICD-10-CM | POA: Diagnosis not present

## 2020-08-23 DIAGNOSIS — M25661 Stiffness of right knee, not elsewhere classified: Secondary | ICD-10-CM | POA: Diagnosis not present

## 2020-08-28 DIAGNOSIS — R262 Difficulty in walking, not elsewhere classified: Secondary | ICD-10-CM | POA: Diagnosis not present

## 2020-08-28 DIAGNOSIS — M25661 Stiffness of right knee, not elsewhere classified: Secondary | ICD-10-CM | POA: Diagnosis not present

## 2020-08-28 DIAGNOSIS — M6281 Muscle weakness (generalized): Secondary | ICD-10-CM | POA: Diagnosis not present

## 2020-08-28 DIAGNOSIS — M25561 Pain in right knee: Secondary | ICD-10-CM | POA: Diagnosis not present

## 2020-08-30 DIAGNOSIS — R262 Difficulty in walking, not elsewhere classified: Secondary | ICD-10-CM | POA: Diagnosis not present

## 2020-08-30 DIAGNOSIS — M25561 Pain in right knee: Secondary | ICD-10-CM | POA: Diagnosis not present

## 2020-08-30 DIAGNOSIS — M25661 Stiffness of right knee, not elsewhere classified: Secondary | ICD-10-CM | POA: Diagnosis not present

## 2020-08-30 DIAGNOSIS — M6281 Muscle weakness (generalized): Secondary | ICD-10-CM | POA: Diagnosis not present

## 2020-09-04 DIAGNOSIS — M25661 Stiffness of right knee, not elsewhere classified: Secondary | ICD-10-CM | POA: Diagnosis not present

## 2020-09-04 DIAGNOSIS — M1711 Unilateral primary osteoarthritis, right knee: Secondary | ICD-10-CM | POA: Diagnosis not present

## 2020-09-04 DIAGNOSIS — R262 Difficulty in walking, not elsewhere classified: Secondary | ICD-10-CM | POA: Diagnosis not present

## 2020-09-04 DIAGNOSIS — M25561 Pain in right knee: Secondary | ICD-10-CM | POA: Diagnosis not present

## 2020-09-05 NOTE — Progress Notes (Signed)
Office Visit Note  Patient: Patricia Wagner             Date of Birth: 1951-08-31           MRN: [id number]             PCP: Maximiano Coss, NP Referring: Maximiano Coss, NP Visit Date: 09/18/2020 Occupation: @GUAROCC @  Subjective:  Other (left knee pain and right hand paresthesias )   History of Present Illness: Patricia Wagner is a 69 y.o. female with history of severe rheumatoid arthritis and osteoarthritis overlap. She states that she has been risk. Seeing pain and numbness in her right hand recently. She also has pain and discomfort in her left knee joint for the last few months. She has been having difficulty walking due to left knee joint.  Patient states that she has had cortisone injections in the past with inadequate response.  She has had good results to Visco supplement injections in the past.  She would like to have risks of injections.  Right knee joint is replaced which is doing well. He denies any gout flare. She states that the Raynaud's is not active currently.  Activities of Daily Living:  Patient reports morning stiffness for several hours or less. Patient Denies nocturnal pain.  Difficulty dressing/grooming: Denies Difficulty climbing stairs: Reports Difficulty getting out of chair: Denies Difficulty using hands for taps, buttons, cutlery, and/or writing: Reports  Review of Systems  Constitutional: Negative for fatigue.  HENT: Negative for mouth sores, mouth dryness and nose dryness.   Eyes: Negative for pain, itching and dryness.  Respiratory: Negative for shortness of breath and difficulty breathing.   Cardiovascular: Negative for chest pain and palpitations.  Gastrointestinal: Negative for blood in stool, constipation and diarrhea.  Endocrine: Negative for increased urination.  Genitourinary: Negative for difficulty urinating.  Musculoskeletal: Positive for arthralgias, joint pain, muscle weakness and morning stiffness. Negative for joint swelling, myalgias,  muscle tenderness and myalgias.  Skin: Negative for color change, rash and redness.  Allergic/Immunologic: Negative for susceptible to infections.  Neurological: Positive for parasthesias. Negative for dizziness, numbness, headaches, memory loss and weakness.  Hematological: Negative for bruising/bleeding tendency.  Psychiatric/Behavioral: Negative for confusion.    PMFS History:  Patient Active Problem List   Diagnosis Date Noted  . Acute GI bleeding 05/14/2020  . Primary osteoarthritis of right knee 03/20/2020  . History of lower GI bleeding 03/20/2020  . Acute lower GI bleeding 01/18/2020  . Acquired thrombophilia ([place]) 11/14/2019  . Persistent atrial fibrillation ([place]) 10/13/2019  . Allergic rhinitis 09/10/2019  . Left ear pain 09/10/2019  . Vitamin D deficiency 04/15/2019  . Cardiogenic shock ([place])   . Acute on chronic respiratory failure with hypoxia ([place])   . Elevated troponin 08/05/2018  . Hyperbilirubinemia 08/05/2018  . Abnormal transaminases 08/05/2018  . Dysarthria 08/05/2018  . Lower GI bleed 03/15/2018  . Chronic anticoagulation 03/15/2018  . Rectal bleed   . Acute renal failure superimposed on stage 4 chronic kidney disease ([place]) 03/05/2018  . Coagulopathy ([place]) 03/05/2018  . Chronic systolic (congestive) heart failure ([place]) 03/05/2018  . Chronic atrial fibrillation 03/05/2018  . Acute blood loss anemia 03/05/2018  . Diverticulosis 03/05/2018  . Heme positive stool 03/05/2018  . Urine abnormality 02/02/2018  . Cough 02/02/2018  . Bacteremia due to Gram-negative bacteria 12/30/2017  . Increased anion gap metabolic acidosis 40/06/6760  . Metabolic alkalosis 95/07/3266  . Acute UTI 12/29/2017  . Obstructive uropathy   . Anemia 12/17/2017  . AKI (  acute kidney injury) ([place]) 11/30/2017  . Diarrhea 11/30/2017  . Urinary retention 11/23/2017  . Lower abdominal pain 11/23/2017  . Dysuria 07/21/2017  . Suspected sleep apnea 05/20/2017  . Pressure injury of skin  05/02/2017  . Acute respiratory failure with hypoxia ([place]) 04/30/2017  . Hypotension (arterial) 04/30/2017  . Bradycardia 11/20/2016  . Chronic combined systolic (congestive) and diastolic (congestive) heart failure ([place]) 11/20/2016  . Cerebrovascular accident (CVA) due to embolism of left cerebellar artery ([place])   . PAF (paroxysmal atrial fibrillation) ([place])   . CHF (congestive heart failure) ([place]) 10/31/2016  . CAD in native artery, s/p cardiac cath with non obstructive CAD 10/24/2016  . NICM (nonischemic cardiomyopathy) ([place]) 10/24/2016  . Tobacco abuse 10/20/2016  . Acute renal failure superimposed on chronic kidney disease ([place]) 10/20/2016  . Hematochezia 06/29/2013  . Degenerative arthritis of left knee 03/15/2012  . Encounter for well adult exam with abnormal findings 03/12/2012  . Hypersomnia   . MENOPAUSAL DISORDER 01/09/2011  . THYROID NODULE, RIGHT 01/04/2010  . HLD (hyperlipidemia) 02/03/2008  . LEG CRAMPS, NOCTURNAL 02/03/2008  . LOW BACK PAIN 07/25/2007  . Morbid obesity ([place]) 07/22/2007  . Essential hypertension 07/22/2007  . Raynaud's syndrome 07/22/2007  . Diverticulosis of colon with hemorrhage 07/22/2007  . SYMPTOM, EDEMA 07/22/2007    Past Medical History:  Diagnosis Date  . Acute blood loss anemia 03/05/2018  . Acute hypercapnic respiratory failure ([place]) 04/30/2017  . Acute renal failure superimposed on chronic kidney disease ([place]) 10/20/2016  . Arthritis    hands, knees  . Atrial fibrillation ([place])    a. s/p multiple cardioversions; failed tikosyn/sotalol.  . Bradycardia 11/20/2016  . CAD in native artery, s/p cardiac cath with non obstructive CAD 10/24/2016  . Cerebrovascular accident (CVA) due to embolism of left cerebellar artery ([place])   . CHF (congestive heart failure) ([place])   . Chronic anticoagulation 03/15/2018  . Chronic atrial fibrillation ([place]) 03/05/2018  . Chronic systolic (congestive) heart failure ([place]) 03/05/2018  . CKD (chronic kidney disease)  stage 3, GFR 30-59 ml/min (HCC)   . Coagulopathy ([place]) 03/05/2018  . DIVERTICULITIS, HX OF 07/25/2007  . Diverticulosis of colon with hemorrhage 07/22/2007   Qualifier: Diagnosis of  By: [name]    . DIVERTICULOSIS, COLON 07/22/2007  . Dysuria 07/21/2017  . Edema, peripheral    a. chronic BLE edema, R>L. Prior trauma from dog attack and accident.  . Essential hypertension 07/22/2007   Qualifier: Diagnosis of  By: [name]    . History of kidney stones   . HLD (hyperlipidemia) 02/03/2008   Qualifier: Diagnosis of  By: [name], [name]   . HYPERLIPIDEMIA 02/03/2008  . Hypersomnia    declines w/u  . Hypertension   . Hypotension (arterial) 04/30/2017  . MENOPAUSAL DISORDER 01/09/2011  . Morbid obesity ([place]) 07/22/2007  . NICM (nonischemic cardiomyopathy) ([place]) 10/24/2016  . PAF (paroxysmal atrial fibrillation) ([place])   . Presence of indwelling urinary catheter    supra pubic cath  . Raynaud's syndrome 07/22/2007  . Stroke ([place]) 2017  . Suspected sleep apnea 05/20/2017  . THYROID NODULE, RIGHT 01/04/2010  . VITAMIN D DEFICIENCY 01/09/2011   Qualifier: Diagnosis of  By: [name], [name]     Family History  Problem Relation Age of Onset  . Asthma Mother   . Diabetes Father   . Heart disease Father        Died of presumed heart attack - 43s  . Lung disease Sister   .  Heart disease Sister        Twin sister has heart issue, unclear what kind  . Healthy Daughter   . Thyroid disease Neg Hx   . Colon polyps Neg Hx   . Esophageal cancer Neg Hx   . Pancreatic cancer Neg Hx   . Stomach cancer Neg Hx    Past Surgical History:  Procedure Laterality Date  . ATRIAL FIBRILLATION ABLATION N/A 10/13/2019   Procedure: ATRIAL FIBRILLATION ABLATION;  Surgeon: [name], MD;  Location: [place] [facility];  Service: Cardiovascular;  Laterality: N/A;  . CARDIAC CATHETERIZATION N/A 10/23/2016   Procedure: Left Heart Cath and Coronary Angiography;  Surgeon: [name], MD;   Location: [place] [facility];  Service: Cardiovascular;  Laterality: N/A;  . CARDIOVERSION N/A 10/31/2016   Procedure: CARDIOVERSION;  Surgeon: [name], MD;  Location: [place];  Service: Cardiovascular;  Laterality: N/A;  . CARDIOVERSION N/A 11/03/2016   Procedure: CARDIOVERSION;  Surgeon: [name], MD;  Location: [place];  Service: Cardiovascular;  Laterality: N/A;  . CARDIOVERSION N/A 11/18/2016   Procedure: CARDIOVERSION;  Surgeon: [name], MD;  Location: [place];  Service: Cardiovascular;  Laterality: N/A;  . CARDIOVERSION N/A 03/25/2017   Procedure: CARDIOVERSION;  Surgeon: [name], MD;  Location: [facility] ENDOSCOPY;  Service: Cardiovascular;  Laterality: N/A;  . CARDIOVERSION N/A 05/06/2017   Procedure: CARDIOVERSION;  Surgeon: [name], MD;  Location: [facility] ENDOSCOPY;  Service: Cardiovascular;  Laterality: N/A;  . CARDIOVERSION N/A 05/12/2017   Procedure: CARDIOVERSION;  Surgeon: [name], MD;  Location: [facility] ENDOSCOPY;  Service: Cardiovascular;  Laterality: N/A;  . COLONOSCOPY N/A 12/04/2017   Procedure: COLONOSCOPY;  Surgeon: [name], MD;  Location: [place];  Service: Endoscopy;  Laterality: N/A;  . COLONOSCOPY    . COLONOSCOPY W/ POLYPECTOMY  02/2011   pan diverticulosis.  tubular adenoma without dysplasia on 5 mm sigmoid polyp.  Dr Fuller Plan.    . ESOPHAGOGASTRODUODENOSCOPY (EGD) WITH PROPOFOL N/A 05/16/2020   Procedure: ESOPHAGOGASTRODUODENOSCOPY (EGD) WITH PROPOFOL;  Surgeon: Milus Banister, MD;  Location: [facility] ENDOSCOPY;  Service: Endoscopy;  Laterality: N/A;  . FLEXIBLE SIGMOIDOSCOPY N/A 01/18/2020   Procedure: FLEXIBLE SIGMOIDOSCOPY;  Surgeon: [name], DO;  Location: [place];  Service: Gastroenterology;  Laterality: N/A;  . HEMOSTASIS CLIP PLACEMENT  01/18/2020   Procedure: HEMOSTASIS CLIP PLACEMENT;  Surgeon: [name], DO;  Location: [place];  Service: Gastroenterology;;  . HOT HEMOSTASIS  N/A 05/16/2020   Procedure: HOT HEMOSTASIS (ARGON PLASMA COAGULATION/BICAP);  Surgeon: Milus Banister, MD;  Location: [facility];  Service: Endoscopy;  Laterality: N/A;  . PARTIAL HYSTERECTOMY     1 OVARY LEFT  . SCLEROTHERAPY  01/18/2020   Procedure: SCLEROTHERAPY;  Surgeon: [name], DO;  Location: [place] ENDOSCOPY;  Service: Gastroenterology;;  . TEE WITHOUT CARDIOVERSION N/A 10/31/2016   Procedure: TRANSESOPHAGEAL ECHOCARDIOGRAM (TEE);  Surgeon: [name], MD;  Location: [facility] ENDOSCOPY;  Service: Cardiovascular;  Laterality: N/A;  . TEE WITHOUT CARDIOVERSION N/A 03/25/2017   Procedure: TRANSESOPHAGEAL ECHOCARDIOGRAM (TEE);  Surgeon: [name], MD;  Location: [facility] ENDOSCOPY;  Service: Cardiovascular;  Laterality: N/A;  . TOTAL KNEE ARTHROPLASTY Right 04/10/2020   Procedure: TOTAL KNEE ARTHROPLASTY;  Surgeon: Renette Butters, MD;  Location: [facility];  Service: Orthopedics;  Laterality: Right;   Social History   Social History Narrative  . Not on file   Immunization History  Administered Date(s) Administered  . Fluad Quad(high Dose 65+)  09/07/2019, 09/17/2020  . Influenza Whole 01/09/2011  . Influenza, High Dose Seasonal PF 08/30/2018  . Influenza,inj,Quad PF,6+ Mos 10/25/2015, 10/24/2016, 07/21/2017  . PFIZER SARS-COV-2 Vaccination 04/28/2020, 05/24/2020  . Pneumococcal Conjugate-13 04/15/2019  . Pneumococcal Polysaccharide-23 10/24/2016, 03/18/2018  . Td 02/02/2009  . Tdap 04/15/2019     Objective: Vital Signs: BP 110/72 (BP Location: Left Arm, Patient Position: Sitting, Cuff Size: Normal)   Pulse 90   Resp 16   Ht 5' 4"  (1.626 m)   Wt 175 lb (79.4 kg)   BMI 30.04 kg/m    Physical Exam Vitals and nursing note reviewed.  Constitutional:      Appearance: She is well-developed.  HENT:     Head: Normocephalic and atraumatic.  Eyes:     Conjunctiva/sclera: Conjunctivae normal.  Cardiovascular:     Rate and Rhythm: Normal rate and regular rhythm.     Heart  sounds: Normal heart sounds.  Pulmonary:     Effort: Pulmonary effort is normal.     Breath sounds: Normal breath sounds.  Abdominal:     General: Bowel sounds are normal.     Palpations: Abdomen is soft.  Musculoskeletal:     Cervical back: Normal range of motion.  Lymphadenopathy:     Cervical: No cervical adenopathy.  Skin:    General: Skin is warm and dry.     Capillary Refill: Capillary refill takes less than 2 seconds.  Neurological:     Mental Status: She is alert and oriented to person, place, and time.  Psychiatric:        Behavior: Behavior normal.      Musculoskeletal Exam: C-spine was in good range of motion.  Shoulder joints and elbow joints with good range of motion.  She has synovial thickening of bilateral wrist joints.  She has synovitis over bilateral MCPs and PIPs.  Right knee joint is replaced.  Left knee joint had warmth and swelling.  She had no tenderness over ankles or MTPs.  CDAI Exam: CDAI Score: 15.2  Patient Global: 6 mm; Provider Global: 6 mm Swollen: 7 ; Tender: 7  Joint Exam 09/18/2020      Right  Left  MCP 2  Swollen Tender  Swollen Tender  MCP 3  Swollen Tender  Swollen Tender  PIP 2     Swollen Tender  PIP 3  Swollen Tender     Knee     Swollen Tender     Investigation: No additional findings.  Imaging: No results found.  Recent Labs: Lab Results  Component Value Date   WBC 6.7 08/09/2020   HGB 11.5 (L) 08/09/2020   PLT 273 08/09/2020   NA 139 08/09/2020   K 3.7 08/09/2020   CL 97 (L) 08/09/2020   CO2 29 08/09/2020   GLUCOSE 81 08/09/2020   BUN 22 08/09/2020   CREATININE 1.56 (H) 08/09/2020   BILITOT 0.7 08/09/2020   ALKPHOS 83 05/03/2020   AST 13 08/09/2020   ALT 3 (L) 08/09/2020   PROT 6.9 08/09/2020   ALBUMIN 2.8 (L) 05/03/2020   CALCIUM 9.3 08/09/2020   GFRAA 39 (L) 08/09/2020    Speciality Comments: No specialty comments available.  Procedures:  No procedures performed Allergies: Ace inhibitors    Assessment / Plan:     Visit Diagnoses: Rheumatoid arthritis involving multiple sites with positive rheumatoid factor (HCC) - with nodulosis and severe inflammatory arthritis with RF, positive anti-CCP, positive ANA, elevated ESR.  She was recently diagnosed and placed on leflunomide.  She  is currently on leflunomide 10 mg p.o. daily which she has been tolerating well.  Her recent labs are stable.  We will increase leflunomide to 20 mg p.o. daily.  We will check labs in 2 weeks and then every 2 months.  We will send a new prescription if 2 weeks labs are stable.  Positive ANA (antinuclear antibody) - ENA negative, C3-C4 normal.  High risk medication use - Arava 72m po qd. labs have been stable.  We will continue to monitor labs closely.  Chronic pain of left knee -she complains of pain and discomfort in her left knee joint.  She states she failed cortisone injection in the past.  She has had viscosupplementation junctions in the past.  Per her request we will apply for Visco supplement injections to her left knee.  Plan: XR KNEE 3 VIEW LEFT.  She has severe end-stage medial compartment narrowing.  She does not want to have a total knee replacement.  This patient is diagnosed with osteoarthritis of the knee(s).    Radiographs show evidence of joint space narrowing, osteophytes, subchondral sclerosis and/or subchondral cysts.  This patient has knee pain which interferes with functional and activities of daily living.    This patient has experienced inadequate response, adverse effects and/or intolerance with conservative treatments such as acetaminophen, NSAIDS, topical creams, physical therapy or regular exercise, knee bracing and/or weight loss.   This patient has experienced inadequate response or has a contraindication to intra articular steroid injections for at least 3 months.   This patient is not scheduled to have a total knee replacement within 6 months of starting treatment with  viscosupplementation.  Raynaud's disease without gangrene-currently not active.  Hyperuricemia-she denies having any gout flare.  Status post total knee replacement, right-doing well.  She mobilizes with the help of a cane.  Primary osteoarthritis of left knee  Stage 3b chronic kidney disease (HCC)-her GFR is in 338s  Other medical problems are listed as follows:  Chronic systolic congestive heart failure (HCC)  CAD in native artery, s/p cardiac cath with non obstructive CAD  NICM (nonischemic cardiomyopathy) ([place]  Cerebrovascular accident (CVA) due to embolism of left cerebellar artery ([place]  Essential hypertension  Chronic atrial fibrillation  Chronic anticoagulation  Bacteremia due to Gram-negative bacteria - patient does not recall when she had sepsis.  Diverticulosis of colon with hemorrhage  Vitamin D deficiency  Osteoporosis screening  Educated about COVID-19 virus infection-she is fully immunized against COVID-19.  Have advised her to get a booster once available to her.  Use of mask, social distancing and hand hygiene was discussed.  Use of monoclonal antibodies were discussed in case she develops COVID-19 infection.  Information was placed in the AVS.  Orders: Orders Placed This Encounter  Procedures  . XR KNEE 3 VIEW LEFT   No orders of the defined types were placed in this encounter.   Follow-Up Instructions: Return in about 3 months (around 12/19/2020) for Rheumatoid arthritis.   SBo Merino MD  Note - This record has been created using DEditor, commissioning  Chart creation errors have been sought, but may not always  have been located. Such creation errors do not reflect on  the standard of medical care.

## 2020-09-11 DIAGNOSIS — M25661 Stiffness of right knee, not elsewhere classified: Secondary | ICD-10-CM | POA: Diagnosis not present

## 2020-09-11 DIAGNOSIS — M1711 Unilateral primary osteoarthritis, right knee: Secondary | ICD-10-CM | POA: Diagnosis not present

## 2020-09-11 DIAGNOSIS — R262 Difficulty in walking, not elsewhere classified: Secondary | ICD-10-CM | POA: Diagnosis not present

## 2020-09-11 DIAGNOSIS — M6281 Muscle weakness (generalized): Secondary | ICD-10-CM | POA: Diagnosis not present

## 2020-09-13 DIAGNOSIS — R338 Other retention of urine: Secondary | ICD-10-CM | POA: Diagnosis not present

## 2020-09-17 ENCOUNTER — Other Ambulatory Visit: Payer: Self-pay

## 2020-09-17 ENCOUNTER — Ambulatory Visit (INDEPENDENT_AMBULATORY_CARE_PROVIDER_SITE_OTHER): Payer: Medicare HMO | Admitting: Registered Nurse

## 2020-09-17 DIAGNOSIS — Z23 Encounter for immunization: Secondary | ICD-10-CM

## 2020-09-17 NOTE — Patient Instructions (Signed)
° ° ° °  If you have lab work done today you will be contacted with your lab results within the next 2 weeks.  If you have not heard from us then please contact us. The fastest way to get your results is to register for My Chart. ° ° °IF you received an x-ray today, you will receive an invoice from [facility]. Please contact [facility] at [phone] with questions or concerns regarding your invoice.  ° °IF you received labwork today, you will receive an invoice from [facility]. Please contact [facility] at [phone] with questions or concerns regarding your invoice.  ° °Our billing staff will not be able to assist you with questions regarding bills from these companies. ° °You will be contacted with the lab results as soon as they are available. The fastest way to get your results is to activate your My Chart account. Instructions are located on the last page of this paperwork. If you have not heard from us regarding the results in 2 weeks, please contact this office. °  ° ° ° °

## 2020-09-18 ENCOUNTER — Telehealth: Payer: Self-pay

## 2020-09-18 ENCOUNTER — Ambulatory Visit: Payer: Medicare HMO | Admitting: Rheumatology

## 2020-09-18 ENCOUNTER — Encounter: Payer: Self-pay | Admitting: Rheumatology

## 2020-09-18 ENCOUNTER — Ambulatory Visit: Payer: Self-pay

## 2020-09-18 VITALS — BP 110/72 | HR 90 | Resp 16 | Ht 64.0 in | Wt 175.0 lb

## 2020-09-18 DIAGNOSIS — K5731 Diverticulosis of large intestine without perforation or abscess with bleeding: Secondary | ICD-10-CM

## 2020-09-18 DIAGNOSIS — I7300 Raynaud's syndrome without gangrene: Secondary | ICD-10-CM

## 2020-09-18 DIAGNOSIS — N1832 Chronic kidney disease, stage 3b: Secondary | ICD-10-CM

## 2020-09-18 DIAGNOSIS — I5022 Chronic systolic (congestive) heart failure: Secondary | ICD-10-CM

## 2020-09-18 DIAGNOSIS — R7689 Other specified abnormal immunological findings in serum: Secondary | ICD-10-CM

## 2020-09-18 DIAGNOSIS — Z79899 Other long term (current) drug therapy: Secondary | ICD-10-CM

## 2020-09-18 DIAGNOSIS — M25562 Pain in left knee: Secondary | ICD-10-CM | POA: Diagnosis not present

## 2020-09-18 DIAGNOSIS — E559 Vitamin D deficiency, unspecified: Secondary | ICD-10-CM

## 2020-09-18 DIAGNOSIS — M0579 Rheumatoid arthritis with rheumatoid factor of multiple sites without organ or systems involvement: Secondary | ICD-10-CM | POA: Diagnosis not present

## 2020-09-18 DIAGNOSIS — G8929 Other chronic pain: Secondary | ICD-10-CM | POA: Diagnosis not present

## 2020-09-18 DIAGNOSIS — R768 Other specified abnormal immunological findings in serum: Secondary | ICD-10-CM

## 2020-09-18 DIAGNOSIS — I482 Chronic atrial fibrillation, unspecified: Secondary | ICD-10-CM

## 2020-09-18 DIAGNOSIS — E790 Hyperuricemia without signs of inflammatory arthritis and tophaceous disease: Secondary | ICD-10-CM | POA: Diagnosis not present

## 2020-09-18 DIAGNOSIS — Z7189 Other specified counseling: Secondary | ICD-10-CM

## 2020-09-18 DIAGNOSIS — I428 Other cardiomyopathies: Secondary | ICD-10-CM

## 2020-09-18 DIAGNOSIS — I63442 Cerebral infarction due to embolism of left cerebellar artery: Secondary | ICD-10-CM

## 2020-09-18 DIAGNOSIS — Z7901 Long term (current) use of anticoagulants: Secondary | ICD-10-CM

## 2020-09-18 DIAGNOSIS — Z96651 Presence of right artificial knee joint: Secondary | ICD-10-CM | POA: Diagnosis not present

## 2020-09-18 DIAGNOSIS — I10 Essential (primary) hypertension: Secondary | ICD-10-CM

## 2020-09-18 DIAGNOSIS — Z13820 Encounter for screening for osteoporosis: Secondary | ICD-10-CM

## 2020-09-18 DIAGNOSIS — M1712 Unilateral primary osteoarthritis, left knee: Secondary | ICD-10-CM | POA: Diagnosis not present

## 2020-09-18 DIAGNOSIS — I2510 Atherosclerotic heart disease of native coronary artery without angina pectoris: Secondary | ICD-10-CM

## 2020-09-18 DIAGNOSIS — R7881 Bacteremia: Secondary | ICD-10-CM

## 2020-09-18 NOTE — Telephone Encounter (Signed)
Please apply for left knee visco, per Dr. Estanislado Pandy. Thanks!

## 2020-09-18 NOTE — Patient Instructions (Addendum)
COVID-19 vaccine recommendations:   COVID-19 vaccine is recommended for everyone (unless you are allergic to a vaccine component), even if you are on a medication that suppresses your immune system.   Do not take Tylenol or any anti-inflammatory medications (NSAIDs) 24 hours prior to the COVID-19 vaccination.   There is no direct evidence about the efficacy of the COVID-19 vaccine in individuals who are on medications that suppress the immune system.   Even if you are fully vaccinated, and you are on any medications that suppress your immune system, please continue to wear a mask, maintain at least six feet social distance and practice hand hygiene.   If you develop a COVID-19 infection, please contact your PCP or our office to determine if you need monoclonal antibody infusion.  The booster vaccine is now available for immunocompromised patients.   Please see the following web sites for updated information.   [email or web address]-Patient-[email or web address]   Standing Labs We placed an order today for your standing lab work.   Please have your standing labs drawn in 2 weeks after increasing the dose of leflunomide and then in 2 months.  Then the lab work will be every 3 months.  If possible, please have your labs drawn 2 weeks prior to your appointment so that the provider can discuss your results at your appointment.  We have open lab daily Monday through Thursday from 8:30-12:30 PM and 1:30-4:30 PM and Friday from 8:30-12:30 PM and 1:30-4:00 PM at the office of Dr. Bo Merino, [facility].   Please be advised, patients with office appointments requiring lab work will take precedents over walk-in lab work.  If possible, please come for your lab work on Monday and Friday afternoons, as you may experience shorter wait times. The office is located at [address] [place], [place], [place] [address] No appointment is  necessary.   Labs are drawn by [facility]. Please bring your co-pay at the time of your lab draw.  You may receive a bill from [facility] for your lab work.  If you wish to have your labs drawn at another location, please call the office 24 hours in advance to send orders.  If you have any questions regarding directions or hours of operation,  please call [phone].   As a reminder, please drink plenty of water prior to coming for your lab work. Thanks!

## 2020-09-19 ENCOUNTER — Telehealth: Payer: Self-pay

## 2020-09-19 DIAGNOSIS — M6281 Muscle weakness (generalized): Secondary | ICD-10-CM | POA: Diagnosis not present

## 2020-09-19 DIAGNOSIS — M25661 Stiffness of right knee, not elsewhere classified: Secondary | ICD-10-CM | POA: Diagnosis not present

## 2020-09-19 DIAGNOSIS — R262 Difficulty in walking, not elsewhere classified: Secondary | ICD-10-CM | POA: Diagnosis not present

## 2020-09-19 DIAGNOSIS — M25561 Pain in right knee: Secondary | ICD-10-CM | POA: Diagnosis not present

## 2020-09-19 NOTE — Telephone Encounter (Signed)
FYI:  Patient called to schedule her 2 week follow-up appointment for gel injections for her left knee.  Patient was told that April will apply for the injections through her insurance and once approved will call to schedule.  Patient did not want to schedule her 3 month follow-up appointment at this time.

## 2020-09-21 NOTE — Telephone Encounter (Signed)
Submitted for VOB 09/21/2020.

## 2020-09-30 ENCOUNTER — Other Ambulatory Visit (HOSPITAL_COMMUNITY): Payer: Self-pay | Admitting: Internal Medicine

## 2020-10-01 ENCOUNTER — Telehealth: Payer: Self-pay

## 2020-10-01 NOTE — Telephone Encounter (Signed)
Attempted to contact the patient and left message for patient to call the office. Patient will need updated labs prior to prescription refill.

## 2020-10-01 NOTE — Telephone Encounter (Signed)
Patient called requesting prescription refill of Leflunomide to be sent to [facility] at [address].  Patient states she needs this prescription sent ASAP because she is out of medication.  Patient states at her last appointment Dr. Estanislado Pandy increased her dosage from 1 tablet per day to 2 tablets per day.

## 2020-10-02 ENCOUNTER — Other Ambulatory Visit: Payer: Self-pay

## 2020-10-02 DIAGNOSIS — Z79899 Other long term (current) drug therapy: Secondary | ICD-10-CM

## 2020-10-03 ENCOUNTER — Other Ambulatory Visit: Payer: Self-pay | Admitting: *Deleted

## 2020-10-03 LAB — CBC WITH DIFFERENTIAL/PLATELET
Absolute Monocytes: 455 cells/uL (ref 200–950)
Basophils Absolute: 40 cells/uL (ref 0–200)
Basophils Relative: 0.6 %
Eosinophils Absolute: 172 cells/uL (ref 15–500)
Eosinophils Relative: 2.6 %
HCT: 36 % (ref 35.0–45.0)
Hemoglobin: 11.5 g/dL — ABNORMAL LOW (ref 11.7–15.5)
Lymphs Abs: 1214 cells/uL (ref 850–3900)
MCH: 28 pg (ref 27.0–33.0)
MCHC: 31.9 g/dL — ABNORMAL LOW (ref 32.0–36.0)
MCV: 87.8 fL (ref 80.0–100.0)
MPV: 12 fL (ref 7.5–12.5)
Monocytes Relative: 6.9 %
Neutro Abs: 4719 cells/uL (ref 1500–7800)
Neutrophils Relative %: 71.5 %
Platelets: 221 10*3/uL (ref 140–400)
RBC: 4.1 10*6/uL (ref 3.80–5.10)
RDW: 13.8 % (ref 11.0–15.0)
Total Lymphocyte: 18.4 %
WBC: 6.6 10*3/uL (ref 3.8–10.8)

## 2020-10-03 LAB — COMPLETE METABOLIC PANEL WITH GFR
AG Ratio: 1.2 (calc) (ref 1.0–2.5)
ALT: 4 U/L — ABNORMAL LOW (ref 6–29)
AST: 12 U/L (ref 10–35)
Albumin: 3.7 g/dL (ref 3.6–5.1)
Alkaline phosphatase (APISO): 67 U/L (ref 37–153)
BUN/Creatinine Ratio: 22 (calc) (ref 6–22)
BUN: 41 mg/dL — ABNORMAL HIGH (ref 7–25)
CO2: 29 mmol/L (ref 20–32)
Calcium: 9.6 mg/dL (ref 8.6–10.4)
Chloride: 97 mmol/L — ABNORMAL LOW (ref 98–110)
Creat: 1.9 mg/dL — ABNORMAL HIGH (ref 0.50–0.99)
GFR, Est African American: 31 mL/min/{1.73_m2} — ABNORMAL LOW (ref >=60)
GFR, Est Non African American: 27 mL/min/{1.73_m2} — ABNORMAL LOW (ref >=60)
Globulin: 3.2 g/dL (calc) (ref 1.9–3.7)
Glucose, Bld: 91 mg/dL (ref 65–99)
Potassium: 4.1 mmol/L (ref 3.5–5.3)
Sodium: 137 mmol/L (ref 135–146)
Total Bilirubin: 0.6 mg/dL (ref 0.2–1.2)
Total Protein: 6.9 g/dL (ref 6.1–8.1)

## 2020-10-03 NOTE — Telephone Encounter (Signed)
Last Visit: 09/18/2020 Next Visit: 01/02/2021 Labs: 10/02/2020 Creatinine is elevated and has trended up significantly. GFR is 31. Anemia is stable and likely due to chronic kidney disease.   Current Dose per office note 09/18/2020: We will increase leflunomide to 20 mg p.o. daily DX: Rheumatoid arthritis involving multiple sites with positive rheumatoid factor   Okay to refill Arava?

## 2020-10-03 NOTE — Telephone Encounter (Signed)
-----   Message from Ofilia Neas, PA-C sent at 10/03/2020  1:40 PM EST ----- Creatinine is elevated and has trended up significantly.  GFR is 31.  Please notify the patient and forward lab work to nephrologist.   Anemia is stable and likely due to chronic kidney disease.

## 2020-10-03 NOTE — Addendum Note (Signed)
Addended by: Carole Binning on: 10/03/2020 04:30 PM   Modules accepted: Orders

## 2020-10-03 NOTE — Telephone Encounter (Signed)
Please call to schedule Visco knee injections.  Authorized for Orthovisc series Left knee. Buy and Bill. Deductible does not apply.  PA for Orthovisc thru [employer]. Authorization # [id number], Effective 09/21/2020 thru 11/23/2020.  Insurance to cover 100% of allowable cost since OOP of $3900. Has been met.

## 2020-10-04 MED ORDER — LEFLUNOMIDE 10 MG PO TABS
20.0000 mg | ORAL_TABLET | Freq: Every day | ORAL | 0 refills | Status: DC
Start: 2020-10-04 — End: 2020-10-24

## 2020-10-04 NOTE — Addendum Note (Signed)
Addended by: Ofilia Neas on: 10/04/2020 07:58 AM   Modules accepted: Orders

## 2020-10-08 ENCOUNTER — Ambulatory Visit (INDEPENDENT_AMBULATORY_CARE_PROVIDER_SITE_OTHER): Payer: Medicare HMO | Admitting: Physician Assistant

## 2020-10-08 ENCOUNTER — Other Ambulatory Visit: Payer: Self-pay

## 2020-10-08 DIAGNOSIS — M1712 Unilateral primary osteoarthritis, left knee: Secondary | ICD-10-CM

## 2020-10-08 MED ORDER — HYALURONAN 30 MG/2ML IX SOSY
30.0000 mg | PREFILLED_SYRINGE | INTRA_ARTICULAR | Status: AC | PRN
  Administered 2020-10-08: 30 mg via INTRA_ARTICULAR

## 2020-10-08 MED ORDER — LIDOCAINE HCL 1 % IJ SOLN
1.5000 mL | INTRAMUSCULAR | Status: AC | PRN
  Administered 2020-10-08: 1.5 mL

## 2020-10-08 NOTE — Progress Notes (Signed)
   Procedure Note  Patient: Patricia Wagner             Date of Birth: 1951-09-28           MRN: [id number]             Visit Date: 10/08/2020  Procedures: Visit Diagnoses:  1. Primary osteoarthritis of left knee     Orthovisc #1 left knee, B/B Large Joint Inj: L knee on 10/08/2020 8:41 AM Indications: pain Details: 25 G 1.5 in needle, medial approach  Arthrogram: No  Medications: 1.5 mL lidocaine 1 %; 30 mg Hyaluronan 30 MG/2ML Aspirate: 0 mL Outcome: tolerated well, no immediate complications Procedure, treatment alternatives, risks and benefits explained, specific risks discussed. Consent was given by the patient. Immediately prior to procedure a time out was called to verify the correct patient, procedure, equipment, support staff and site/side marked as required. Patient was prepped and draped in the usual sterile fashion.      Patient tolerated the procedure well.  Aftercare was discussed.  Hazel Sams, PA-C

## 2020-10-10 ENCOUNTER — Other Ambulatory Visit: Payer: Self-pay | Admitting: Physician Assistant

## 2020-10-10 DIAGNOSIS — N189 Chronic kidney disease, unspecified: Secondary | ICD-10-CM | POA: Diagnosis not present

## 2020-10-15 ENCOUNTER — Ambulatory Visit (INDEPENDENT_AMBULATORY_CARE_PROVIDER_SITE_OTHER): Payer: Medicare HMO | Admitting: Physician Assistant

## 2020-10-15 ENCOUNTER — Other Ambulatory Visit: Payer: Self-pay

## 2020-10-15 DIAGNOSIS — M1712 Unilateral primary osteoarthritis, left knee: Secondary | ICD-10-CM

## 2020-10-15 MED ORDER — LIDOCAINE HCL 1 % IJ SOLN
1.5000 mL | INTRAMUSCULAR | Status: AC | PRN
  Administered 2020-10-15: 1.5 mL

## 2020-10-15 MED ORDER — HYALURONAN 30 MG/2ML IX SOSY
30.0000 mg | PREFILLED_SYRINGE | INTRA_ARTICULAR | Status: AC | PRN
  Administered 2020-10-15: 30 mg via INTRA_ARTICULAR

## 2020-10-15 NOTE — Progress Notes (Signed)
   Procedure Note  Patient: Patricia Wagner             Date of Birth: 05/04/1951           MRN: [id number]             Visit Date: 10/15/2020  Procedures: Visit Diagnoses:  1. Primary osteoarthritis of left knee     Orthovisc #2 left knee, B/B Large Joint Inj: L knee on 10/15/2020 8:37 AM Indications: pain Details: 25 G 1.5 in needle, medial approach  Arthrogram: No  Medications: 1.5 mL lidocaine 1 %; 30 mg Hyaluronan 30 MG/2ML Aspirate: 0 mL Outcome: tolerated well, no immediate complications Procedure, treatment alternatives, risks and benefits explained, specific risks discussed. Consent was given by the patient. Immediately prior to procedure a time out was called to verify the correct patient, procedure, equipment, support staff and site/side marked as required. Patient was prepped and draped in the usual sterile fashion.      Patient tolerated the procedure well.  Aftercare was discussed.  Hazel Sams, PA-C

## 2020-10-16 DIAGNOSIS — R338 Other retention of urine: Secondary | ICD-10-CM | POA: Diagnosis not present

## 2020-10-22 ENCOUNTER — Other Ambulatory Visit: Payer: Self-pay

## 2020-10-22 ENCOUNTER — Ambulatory Visit (INDEPENDENT_AMBULATORY_CARE_PROVIDER_SITE_OTHER): Payer: Medicare HMO | Admitting: Physician Assistant

## 2020-10-22 ENCOUNTER — Telehealth: Payer: Self-pay | Admitting: Rheumatology

## 2020-10-22 ENCOUNTER — Ambulatory Visit: Payer: Medicare HMO | Admitting: Physician Assistant

## 2020-10-22 DIAGNOSIS — M1712 Unilateral primary osteoarthritis, left knee: Secondary | ICD-10-CM | POA: Diagnosis not present

## 2020-10-22 MED ORDER — LIDOCAINE HCL 1 % IJ SOLN
1.5000 mL | INTRAMUSCULAR | Status: AC | PRN
  Administered 2020-10-22: 1.5 mL

## 2020-10-22 MED ORDER — HYALURONAN 30 MG/2ML IX SOSY
30.0000 mg | PREFILLED_SYRINGE | INTRA_ARTICULAR | Status: AC | PRN
  Administered 2020-10-22: 30 mg via INTRA_ARTICULAR

## 2020-10-22 NOTE — Telephone Encounter (Signed)
Patient wants to know if she is okay to restart her medication with [place]. Patient states Kidney doctor told her labs were good. Patient sees Dr. Justin Mend. Please call to advise.

## 2020-10-22 NOTE — Progress Notes (Signed)
   Procedure Note  Patient: Patricia Wagner             Date of Birth: 03/12/1951           MRN: [id number]             Visit Date: 10/22/2020  Procedures: Visit Diagnoses:  1. Primary osteoarthritis of left knee    Orthovisc #3 Left knee joint injection   Large Joint Inj: L knee on 10/22/2020 8:00 AM Indications: pain Details: 25 G 1.5 in needle, medial approach  Arthrogram: No  Medications: 1.5 mL lidocaine 1 %; 30 mg Hyaluronan 30 MG/2ML Aspirate: 0 mL Outcome: tolerated well, no immediate complications Procedure, treatment alternatives, risks and benefits explained, specific risks discussed. Consent was given by the patient. Immediately prior to procedure a time out was called to verify the correct patient, procedure, equipment, support staff and site/side marked as required. Patient was prepped and draped in the usual sterile fashion.     Patient tolerated the procedure well.  Aftercare was discussed.  Hazel Sams, PA-C

## 2020-10-23 ENCOUNTER — Other Ambulatory Visit (HOSPITAL_COMMUNITY): Payer: Self-pay | Admitting: Internal Medicine

## 2020-10-23 NOTE — Telephone Encounter (Signed)
Contacted Dr. Jason Nest office and requested labs to be faxed.  Awaiting fax.

## 2020-10-23 NOTE — Telephone Encounter (Signed)
Patient states she was without medication for about 1-2 weeks. Patient states she ran out of medication. Patient advised we sent her prescription to the pharmacy on 10/04/2020. Patient advised we were not waiting on labs from Dr. Justin Mend and we did not indicate for her to stop the medication. Patient has not picked up her prescription from the pharmacy. Patient advised her prescription should still be on file at the pharmacy and she may contact them to have it filled.

## 2020-10-23 NOTE — Telephone Encounter (Signed)
Please ask patient when that she stop Arava?

## 2020-10-23 NOTE — Telephone Encounter (Signed)
Please send prescription for leflunomide 20 mg p.o. daily total 30-day supply with 2 refills.

## 2020-10-23 NOTE — Telephone Encounter (Signed)
Prescription was already sent to the pharmacy on 10/04/2020.

## 2020-10-23 NOTE — Telephone Encounter (Signed)
Labs received from Dr. Justin Mend.   TIBC 225 UIBC 178 Iron 47 Iron Saturation 21 Ferritin 70  BUN 28 Creatinine 1.57 (previous 1.90) GFR 39 (previous 31) A/G Ratio 1.1  Patient asking if it is okay to restart her medication. She is on Arava 20 mg daily.

## 2020-10-24 ENCOUNTER — Other Ambulatory Visit: Payer: Self-pay | Admitting: Rheumatology

## 2020-10-24 MED ORDER — LEFLUNOMIDE 20 MG PO TABS: 20.0000 mg | ORAL_TABLET | Freq: Every day | ORAL | 2 refills | Status: DC

## 2020-10-24 NOTE — Telephone Encounter (Signed)
Patient states insurance will not pay for Leflunomide  10mg  tabs twice a day, insurance wants an rx for 20 mg tabs. Please call patient to advise. [facility] [address].

## 2020-11-09 DIAGNOSIS — H43813 Vitreous degeneration, bilateral: Secondary | ICD-10-CM | POA: Diagnosis not present

## 2020-11-09 DIAGNOSIS — H524 Presbyopia: Secondary | ICD-10-CM | POA: Diagnosis not present

## 2020-11-09 DIAGNOSIS — H52223 Regular astigmatism, bilateral: Secondary | ICD-10-CM | POA: Diagnosis not present

## 2020-11-12 ENCOUNTER — Other Ambulatory Visit (HOSPITAL_COMMUNITY): Payer: Self-pay | Admitting: Internal Medicine

## 2020-11-13 ENCOUNTER — Encounter (HOSPITAL_COMMUNITY): Payer: Self-pay | Admitting: Cardiology

## 2020-11-13 DIAGNOSIS — R338 Other retention of urine: Secondary | ICD-10-CM | POA: Diagnosis not present

## 2020-11-26 ENCOUNTER — Other Ambulatory Visit (HOSPITAL_COMMUNITY): Payer: Self-pay | Admitting: *Deleted

## 2020-11-26 ENCOUNTER — Telehealth (HOSPITAL_COMMUNITY): Payer: Self-pay | Admitting: Internal Medicine

## 2020-11-26 MED ORDER — FUROSEMIDE 80 MG PO TABS
80.0000 mg | ORAL_TABLET | Freq: Two times a day (BID) | ORAL | 0 refills | Status: DC
Start: 2020-11-26 — End: 2021-01-18

## 2020-11-26 MED ORDER — RANOLAZINE ER 500 MG PO TB12
500.0000 mg | ORAL_TABLET | Freq: Two times a day (BID) | ORAL | 3 refills | Status: DC
Start: 2020-11-26 — End: 2021-01-18

## 2020-11-26 MED ORDER — POTASSIUM CHLORIDE CRYS ER 20 MEQ PO TBCR
40.0000 meq | EXTENDED_RELEASE_TABLET | Freq: Every day | ORAL | 1 refills | Status: DC
Start: 2020-11-26 — End: 2021-01-18

## 2020-11-26 MED ORDER — ISOSORBIDE MONONITRATE ER 30 MG PO TB24: ORAL_TABLET | ORAL | 1 refills | Status: DC

## 2020-11-26 NOTE — Telephone Encounter (Signed)
Pt request refill for ranolazine, isosorbide monoitrate, furosemide, potassium chloride , [facility] [address]. Pt can be reached [phone]. Thanks

## 2020-12-18 DIAGNOSIS — R338 Other retention of urine: Secondary | ICD-10-CM | POA: Diagnosis not present

## 2020-12-19 NOTE — Progress Notes (Signed)
Office Visit Note  Patient: Patricia Wagner             Date of Birth: [birth year]           MRN: [id number]             PCP: Maximiano Coss, NP Referring: Maximiano Coss, NP Visit Date: 01/02/2021 Occupation: @GUAROCC @  Subjective:  Other (Right hand pain/swelling/stiffness. Patient reports "knots" on bilateral elbows)   History of Present Illness: Patricia Wagner is a 70 y.o. female with history of rheumatoid arthritis, and osteoarthritis.  She continues to have pain and discomfort in multiple joints.  She states her hands stays stiff and swollen.  She has noticed some improvement on leflunomide.  She is also concerned about the nodules over her bilateral elbows.  They are not painful.  She continues to have some discomfort in her knee joints.  Activities of Daily Living:  Patient reports morning stiffness for several  minutes.   Patient Denies nocturnal pain.  Difficulty dressing/grooming: Denies Difficulty climbing stairs: Reports Difficulty getting out of chair: Denies Difficulty using hands for taps, buttons, cutlery, and/or writing: Reports  Review of Systems  Constitutional: Negative for fatigue.  HENT: Negative for mouth sores, mouth dryness and nose dryness.   Eyes: Negative for pain, itching and dryness.  Respiratory: Negative for shortness of breath and difficulty breathing.   Cardiovascular: Negative for chest pain and palpitations.  Gastrointestinal: Negative for blood in stool, constipation and diarrhea.  Endocrine: Negative for increased urination.  Genitourinary: Negative for difficulty urinating.  Musculoskeletal: Positive for arthralgias, joint pain, joint swelling and morning stiffness. Negative for myalgias, muscle tenderness and myalgias.  Skin: Positive for rash. Negative for color change and redness.  Allergic/Immunologic: Negative for susceptible to infections.  Neurological: Negative for dizziness, numbness, headaches, memory loss and weakness.   Hematological: Negative for bruising/bleeding tendency.  Psychiatric/Behavioral: Negative for confusion.    PMFS History:  Patient Active Problem List   Diagnosis Date Noted  . Acute GI bleeding 05/14/2020  . Primary osteoarthritis of right knee 03/20/2020  . History of lower GI bleeding 03/20/2020  . Acute lower GI bleeding 01/18/2020  . Acquired thrombophilia ([place]) 11/14/2019  . Persistent atrial fibrillation ([place]) 10/13/2019  . Allergic rhinitis 09/10/2019  . Left ear pain 09/10/2019  . Vitamin D deficiency 04/15/2019  . Cardiogenic shock ([place])   . Acute on chronic respiratory failure with hypoxia ([place])   . Elevated troponin 08/05/2018  . Hyperbilirubinemia 08/05/2018  . Abnormal transaminases 08/05/2018  . Dysarthria 08/05/2018  . Lower GI bleed 03/15/2018  . Chronic anticoagulation 03/15/2018  . Rectal bleed   . Acute renal failure superimposed on stage 4 chronic kidney disease ([place]) 03/05/2018  . Coagulopathy ([place]) 03/05/2018  . Chronic systolic (congestive) heart failure ([place]) 03/05/2018  . Chronic atrial fibrillation 03/05/2018  . Acute blood loss anemia 03/05/2018  . Diverticulosis 03/05/2018  . Heme positive stool 03/05/2018  . Urine abnormality 02/02/2018  . Cough 02/02/2018  . Bacteremia due to Gram-negative bacteria 12/30/2017  . Increased anion gap metabolic acidosis 40/98/1191  . Metabolic alkalosis 47/82/9562  . Acute UTI 12/29/2017  . Obstructive uropathy   . Anemia 12/17/2017  . AKI (acute kidney injury) ([place]) 11/30/2017  . Diarrhea 11/30/2017  . Urinary retention 11/23/2017  . Lower abdominal pain 11/23/2017  . Dysuria 07/21/2017  . Suspected sleep apnea 05/20/2017  . Pressure injury of skin 05/02/2017  . Acute respiratory failure with hypoxia ([place]) 04/30/2017  . Hypotension (arterial) 04/30/2017  .  Bradycardia 11/20/2016  . Chronic combined systolic (congestive) and diastolic (congestive) heart failure ([place]) 11/20/2016  . Cerebrovascular  accident (CVA) due to embolism of left cerebellar artery ([place])   . PAF (paroxysmal atrial fibrillation) ([place])   . CHF (congestive heart failure) ([place]) 10/31/2016  . CAD in native artery, s/p cardiac cath with non obstructive CAD 10/24/2016  . NICM (nonischemic cardiomyopathy) ([place]) 10/24/2016  . Tobacco abuse 10/20/2016  . Acute renal failure superimposed on chronic kidney disease ([place]) 10/20/2016  . Hematochezia 06/29/2013  . Degenerative arthritis of left knee 03/15/2012  . Encounter for well adult exam with abnormal findings 03/12/2012  . Hypersomnia   . MENOPAUSAL DISORDER 01/09/2011  . THYROID NODULE, RIGHT 01/04/2010  . HLD (hyperlipidemia) 02/03/2008  . LEG CRAMPS, NOCTURNAL 02/03/2008  . LOW BACK PAIN 07/25/2007  . Morbid obesity ([place]) 07/22/2007  . Essential hypertension 07/22/2007  . Raynaud's syndrome 07/22/2007  . Diverticulosis of colon with hemorrhage 07/22/2007  . SYMPTOM, EDEMA 07/22/2007    Past Medical History:  Diagnosis Date  . Acute blood loss anemia 03/05/2018  . Acute hypercapnic respiratory failure ([place]) 04/30/2017  . Acute renal failure superimposed on chronic kidney disease ([place]) 10/20/2016  . Arthritis    hands, knees  . Atrial fibrillation ([place])    a. s/p multiple cardioversions; failed tikosyn/sotalol.  . Bradycardia 11/20/2016  . CAD in native artery, s/p cardiac cath with non obstructive CAD 10/24/2016  . Cerebrovascular accident (CVA) due to embolism of left cerebellar artery ([place])   . CHF (congestive heart failure) ([place])   . Chronic anticoagulation 03/15/2018  . Chronic atrial fibrillation ([place]) 03/05/2018  . Chronic systolic (congestive) heart failure ([place]) 03/05/2018  . CKD (chronic kidney disease) stage 3, GFR 30-59 ml/min (HCC)   . Coagulopathy ([place]) 03/05/2018  . DIVERTICULITIS, HX OF 07/25/2007  . Diverticulosis of colon with hemorrhage 07/22/2007   Qualifier: Diagnosis of  By: [name]    . DIVERTICULOSIS, COLON 07/22/2007  . Dysuria  07/21/2017  . Edema, peripheral    a. chronic BLE edema, R>L. Prior trauma from dog attack and accident.  . Essential hypertension 07/22/2007   Qualifier: Diagnosis of  By: [name]    . History of kidney stones   . HLD (hyperlipidemia) 02/03/2008   Qualifier: Diagnosis of  By: [name], [name]   . HYPERLIPIDEMIA 02/03/2008  . Hypersomnia    declines w/u  . Hypertension   . Hypotension (arterial) 04/30/2017  . MENOPAUSAL DISORDER 01/09/2011  . Morbid obesity ([place]) 07/22/2007  . NICM (nonischemic cardiomyopathy) ([place]) 10/24/2016  . PAF (paroxysmal atrial fibrillation) ([place])   . Presence of indwelling urinary catheter    supra pubic cath  . Raynaud's syndrome 07/22/2007  . Stroke ([place]) 2017  . Suspected sleep apnea 05/20/2017  . THYROID NODULE, RIGHT 01/04/2010  . VITAMIN D DEFICIENCY 01/09/2011   Qualifier: Diagnosis of  By: [name], [name]     Family History  Problem Relation Age of Onset  . Asthma Mother   . Diabetes Father   . Heart disease Father        Died of presumed heart attack - 87s  . Lung disease Sister   . Heart disease Sister        Twin sister has heart issue, unclear what kind  . Healthy Daughter   . Thyroid disease Neg Hx   . Colon polyps Neg Hx   . Esophageal cancer Neg Hx   . Pancreatic cancer Neg Hx   .  Stomach cancer Neg Hx    Past Surgical History:  Procedure Laterality Date  . ATRIAL FIBRILLATION ABLATION N/A 10/13/2019   Procedure: ATRIAL FIBRILLATION ABLATION;  Surgeon: [name], MD;  Location: [facility];  Service: Cardiovascular;  Laterality: N/A;  . CARDIAC CATHETERIZATION N/A 10/23/2016   Procedure: Left Heart Cath and Coronary Angiography;  Surgeon: [name], MD;  Location: [facility];  Service: Cardiovascular;  Laterality: N/A;  . CARDIOVERSION N/A 10/31/2016   Procedure: CARDIOVERSION;  Surgeon: [name], MD;  Location: [facility];  Service: Cardiovascular;  Laterality: N/A;  . CARDIOVERSION N/A  11/03/2016   Procedure: CARDIOVERSION;  Surgeon: [name], MD;  Location: [facility];  Service: Cardiovascular;  Laterality: N/A;  . CARDIOVERSION N/A 11/18/2016   Procedure: CARDIOVERSION;  Surgeon: [name], MD;  Location: [facility];  Service: Cardiovascular;  Laterality: N/A;  . CARDIOVERSION N/A 03/25/2017   Procedure: CARDIOVERSION;  Surgeon: [name], MD;  Location: [facility] ENDOSCOPY;  Service: Cardiovascular;  Laterality: N/A;  . CARDIOVERSION N/A 05/06/2017   Procedure: CARDIOVERSION;  Surgeon: [name], MD;  Location[facility] ENDOSCOPY;  Service: Cardiovascular;  Laterality: N/A;  . CARDIOVERSION N/A 05/12/2017   Procedure: CARDIOVERSION;  Surgeon: [name], MD;  Location: [facility] ENDOSCOPY;  Service: Cardiovascular;  Laterality: N/A;  . COLONOSCOPY N/A 12/04/2017   Procedure: COLONOSCOPY;  Surgeon: [name], MD;  Location: [facility];  Service: Endoscopy;  Laterality: N/A;  . COLONOSCOPY    . COLONOSCOPY W/ POLYPECTOMY  02/2011   pan diverticulosis.  tubular adenoma without dysplasia on 5 mm sigmoid polyp.  Dr Fuller Plan.    . ESOPHAGOGASTRODUODENOSCOPY (EGD) WITH PROPOFOL N/A 05/16/2020   Procedure: ESOPHAGOGASTRODUODENOSCOPY (EGD) WITH PROPOFOL;  Surgeon: Milus Banister, MD;  Location: [facility] ENDOSCOPY;  Service: Endoscopy;  Laterality: N/A;  . FLEXIBLE SIGMOIDOSCOPY N/A 01/18/2020   Procedure: FLEXIBLE SIGMOIDOSCOPY;  Surgeon: [name], DO;  Location: [facility];  Service: Gastroenterology;  Laterality: N/A;  . HEMOSTASIS CLIP PLACEMENT  01/18/2020   Procedure: HEMOSTASIS CLIP PLACEMENT;  Surgeon: [name], DO;  Location: [place];  Service: Gastroenterology;;  . HOT HEMOSTASIS N/A 05/16/2020   Procedure: HOT HEMOSTASIS (ARGON PLASMA COAGULATION/BICAP);  Surgeon: Milus Banister, MD;  Location: [facility];  Service: Endoscopy;  Laterality: N/A;  . PARTIAL HYSTERECTOMY     1 OVARY LEFT  . SCLEROTHERAPY  01/18/2020    Procedure: SCLEROTHERAPY;  Surgeon: [name], DO;  Location: [facility] ENDOSCOPY;  Service: Gastroenterology;;  . TEE WITHOUT CARDIOVERSION N/A 10/31/2016   Procedure: TRANSESOPHAGEAL ECHOCARDIOGRAM (TEE);  Surgeon: [name], MD;  Location: [facility] ENDOSCOPY;  Service: Cardiovascular;  Laterality: N/A;  . TEE WITHOUT CARDIOVERSION N/A 03/25/2017   Procedure: TRANSESOPHAGEAL ECHOCARDIOGRAM (TEE);  Surgeon: [name], MD;  Location: [facility] ENDOSCOPY;  Service: Cardiovascular;  Laterality: N/A;  . TOTAL KNEE ARTHROPLASTY Right 04/10/2020   Procedure: TOTAL KNEE ARTHROPLASTY;  Surgeon: Renette Butters, MD;  Location: [facility];  Service: Orthopedics;  Laterality: Right;   Social History   Social History Narrative  . Not on file   Immunization History  Administered Date(s) Administered  . Fluad Quad(high Dose 65+) 09/07/2019, 09/17/2020  . Influenza Whole 01/09/2011  . Influenza, High Dose Seasonal PF 08/30/2018  . Influenza,inj,Quad PF,6+ Mos 10/25/2015, 10/24/2016, 07/21/2017  . PFIZER Comirnaty(Gray Top)Covid-19 Tri-Sucrose Vaccine 12/17/2020  . PFIZER(Purple Top)SARS-COV-2 Vaccination 04/28/2020, 05/24/2020  . Pneumococcal Conjugate-13 04/15/2019  . Pneumococcal Polysaccharide-23 10/24/2016, 03/18/2018  . Td 02/02/2009  .  Tdap 04/15/2019     Objective: Vital Signs: BP 121/76 (BP Location: Right Arm, Patient Position: Sitting, Cuff Size: Normal)   Pulse 93   Resp 14   Ht $R'5\' 5"'BA$  (1.651 m)   Wt 181 lb (82.1 kg)   BMI 30.12 kg/m    Physical Exam Vitals and nursing note reviewed.  Constitutional:      Appearance: She is well-developed and well-nourished.  HENT:     Head: Normocephalic and atraumatic.  Eyes:     Extraocular Movements: EOM normal.     Conjunctiva/sclera: Conjunctivae normal.  Cardiovascular:     Rate and Rhythm: Normal rate and regular rhythm.     Pulses: Intact distal pulses.     Heart sounds: Normal heart sounds.  Pulmonary:     Effort: Pulmonary effort is  normal.     Breath sounds: Normal breath sounds.  Abdominal:     General: Bowel sounds are normal.     Palpations: Abdomen is soft.  Musculoskeletal:     Cervical back: Normal range of motion.  Lymphadenopathy:     Cervical: No cervical adenopathy.  Skin:    General: Skin is warm and dry.     Capillary Refill: Capillary refill takes less than 2 seconds.  Neurological:     Mental Status: She is alert and oriented to person, place, and time.  Psychiatric:        Mood and Affect: Mood and affect normal.        Behavior: Behavior normal.      Musculoskeletal Exam: C-spine was in good range of motion.  She had limited painful range of motion of her shoulder joints.  She had rheumatoid nodules over bilateral elbows.  She has synovitis in several of her MCPs and PIPs as described below.  Hip joints were difficult to assess.  She had warmth in her bilateral knee joints.  There was no tenderness over ankles and MTPs.  CDAI Exam: CDAI Score: 27.4  Patient Global: 7 mm; Provider Global: 7 mm Swollen: 11 ; Tender: 15  Joint Exam 01/02/2021      Right  Left  Glenohumeral   Tender   Tender  Elbow   Tender   Tender  MCP 1  Swollen Tender     MCP 2  Swollen Tender  Swollen Tender  MCP 3  Swollen Tender     MCP 5  Swollen Tender  Swollen Tender  PIP 3  Swollen Tender     PIP 4  Swollen Tender  Swollen Tender  Knee  Swollen Tender  Swollen Tender     Investigation: No additional findings.  Imaging: No results found.  Recent Labs: Lab Results  Component Value Date   WBC 6.6 10/02/2020   HGB 11.5 (L) 10/02/2020   PLT 221 10/02/2020   NA 137 10/02/2020   K 4.1 10/02/2020   CL 97 (L) 10/02/2020   CO2 29 10/02/2020   GLUCOSE 91 10/02/2020   BUN 41 (H) 10/02/2020   CREATININE 1.90 (H) 10/02/2020   BILITOT 0.6 10/02/2020   ALKPHOS 83 05/03/2020   AST 12 10/02/2020   ALT 4 (L) 10/02/2020   PROT 6.9 10/02/2020   ALBUMIN 2.8 (L) 05/03/2020   CALCIUM 9.6 10/02/2020   GFRAA 31  (L) 10/02/2020    Speciality Comments: No specialty comments available.  Procedures:  No procedures performed Allergies: Ace inhibitors   Assessment / Plan:     Visit Diagnoses: Rheumatoid arthritis involving multiple sites with positive rheumatoid factor (  [place]) - with nodulosis and severe inflammatory arthritis with RF, positive anti-CCP, positive ANA, elevated ESR.  Patient has severe rheumatoid arthritis involving multiple joints with active synovitis.  She has had some improvement with leflunomide.  We have had discussion regarding different treatment options several times in the past and again today.  Anti-TNF's is not a good choice for her as she has CHF.  She cannot take several DMARDs due to low GFR.  I discussed possible use of Orencia.  She is concerned about the increased risk of infection.  She had sepsis in the past.  She feels uncomfortable with ongoing joint pain and joint swelling.  I discussed the option of trying low-dose prednisone.  She was in agreement.  Indications side effects contraindications of prednisone including the increased risk of hypertension, elevation of blood sugar, osteoporosis, hyperlipidemia and increased risk of infection was discussed.  I will place her on prednisone 5 mg p.o. daily.  We will see her back in 6 weeks.  If that gives her sufficient relief then we can taper the prednisone dose to the least most comfortable dose.  Rheumatoid nodulosis (HCC)-she has rheumatoid nodules over bilateral elbows and some of her pains MCPs.  I offered cortisone injection to the rheumatoid nodules on her elbows.  Patient declined.  High risk medication use - Arava 107m po qd. labs from October 02, 2020 showed anemia most likely due to chronic kidney disease.  Her creatinine was elevated at 1.9.  GFR was low at 31.  We will continue to monitor GFR.  She is also on Lasix which could be contributing to elevated creatinine.- Plan: CBC with Differential/Platelet, COMPLETE  METABOLIC PANEL WITH GFR  Positive ANA (antinuclear antibody) - ENA negative, C3-C4 normal.  She has no clinical features of lupus.  Raynaud's disease without gangrene-currently not symptomatic.  Primary osteoarthritis of left knee - s/p orthovisc left knee 09/2020  Status post total knee replacement, right - She mobilizes with the help of a cane.  Hyperuricemia - June 19, 2000 uric acid 9.1.  There is no history of gout.  Stage 3b chronic kidney disease (HCC)-GFR is in 34s  Other medical problems are listed as follows:  CAD in native artery, s/p cardiac cath with non obstructive CAD  Chronic systolic congestive heart failure (HCC)  Chronic anticoagulation  Vitamin D deficiency  Bacteremia due to Gram-negative bacteria - patient does not recall when she had sepsis.  Diverticulosis of colon with hemorrhage  Essential hypertension  NICM (nonischemic cardiomyopathy) ([place]  Cerebrovascular accident (CVA) due to embolism of left cerebellar artery (HCC)  Chronic atrial fibrillation  Osteoporosis screening-patient wants to discuss DEXA scan with her PCP.  Orders: Orders Placed This Encounter  Procedures  . CBC with Differential/Platelet  . COMPLETE METABOLIC PANEL WITH GFR   Meds ordered this encounter  Medications  . predniSONE (DELTASONE) 5 MG tablet    Sig: Take 1 tablet (5 mg total) by mouth daily with breakfast.    Dispense:  60 tablet    Refill:  0     Follow-Up Instructions: Return in about 6 weeks (around 02/13/2021) for Osteoarthritis, Rheumatoid arthritis.   SBo Merino MD  Note - This record has been created using DEditor, commissioning  Chart creation errors have been sought, but may not always  have been located. Such creation errors do not reflect on  the standard of medical care.

## 2021-01-02 ENCOUNTER — Encounter: Payer: Self-pay | Admitting: Rheumatology

## 2021-01-02 ENCOUNTER — Ambulatory Visit: Payer: Medicare HMO | Admitting: Rheumatology

## 2021-01-02 ENCOUNTER — Other Ambulatory Visit: Payer: Self-pay

## 2021-01-02 VITALS — BP 121/76 | HR 93 | Resp 14 | Ht 65.0 in | Wt 181.0 lb

## 2021-01-02 DIAGNOSIS — E559 Vitamin D deficiency, unspecified: Secondary | ICD-10-CM

## 2021-01-02 DIAGNOSIS — Z96651 Presence of right artificial knee joint: Secondary | ICD-10-CM | POA: Diagnosis not present

## 2021-01-02 DIAGNOSIS — M0579 Rheumatoid arthritis with rheumatoid factor of multiple sites without organ or systems involvement: Secondary | ICD-10-CM

## 2021-01-02 DIAGNOSIS — E790 Hyperuricemia without signs of inflammatory arthritis and tophaceous disease: Secondary | ICD-10-CM

## 2021-01-02 DIAGNOSIS — I428 Other cardiomyopathies: Secondary | ICD-10-CM

## 2021-01-02 DIAGNOSIS — N1832 Chronic kidney disease, stage 3b: Secondary | ICD-10-CM

## 2021-01-02 DIAGNOSIS — R7881 Bacteremia: Secondary | ICD-10-CM

## 2021-01-02 DIAGNOSIS — I7300 Raynaud's syndrome without gangrene: Secondary | ICD-10-CM

## 2021-01-02 DIAGNOSIS — Z7901 Long term (current) use of anticoagulants: Secondary | ICD-10-CM

## 2021-01-02 DIAGNOSIS — Z79899 Other long term (current) drug therapy: Secondary | ICD-10-CM | POA: Diagnosis not present

## 2021-01-02 DIAGNOSIS — R768 Other specified abnormal immunological findings in serum: Secondary | ICD-10-CM

## 2021-01-02 DIAGNOSIS — K5731 Diverticulosis of large intestine without perforation or abscess with bleeding: Secondary | ICD-10-CM

## 2021-01-02 DIAGNOSIS — M0630 Rheumatoid nodule, unspecified site: Secondary | ICD-10-CM

## 2021-01-02 DIAGNOSIS — I5022 Chronic systolic (congestive) heart failure: Secondary | ICD-10-CM

## 2021-01-02 DIAGNOSIS — N183 Chronic kidney disease, stage 3 unspecified: Secondary | ICD-10-CM | POA: Diagnosis not present

## 2021-01-02 DIAGNOSIS — I10 Essential (primary) hypertension: Secondary | ICD-10-CM

## 2021-01-02 DIAGNOSIS — Z13820 Encounter for screening for osteoporosis: Secondary | ICD-10-CM

## 2021-01-02 DIAGNOSIS — D509 Iron deficiency anemia, unspecified: Secondary | ICD-10-CM | POA: Diagnosis not present

## 2021-01-02 DIAGNOSIS — M1712 Unilateral primary osteoarthritis, left knee: Secondary | ICD-10-CM | POA: Diagnosis not present

## 2021-01-02 DIAGNOSIS — I63442 Cerebral infarction due to embolism of left cerebellar artery: Secondary | ICD-10-CM

## 2021-01-02 DIAGNOSIS — I2510 Atherosclerotic heart disease of native coronary artery without angina pectoris: Secondary | ICD-10-CM

## 2021-01-02 DIAGNOSIS — I482 Chronic atrial fibrillation, unspecified: Secondary | ICD-10-CM

## 2021-01-02 MED ORDER — PREDNISONE 5 MG PO TABS: 5.0000 mg | ORAL_TABLET | Freq: Every day | ORAL | 0 refills | Status: DC

## 2021-01-02 NOTE — Patient Instructions (Signed)
Standing Labs We placed an order today for your standing lab work.   Please have your standing labs drawn in May and every 3 months   If possible, please have your labs drawn 2 weeks prior to your appointment so that the provider can discuss your results at your appointment.  We have open lab daily Monday through Thursday from 1:30-4:30 PM and Friday from 1:30-4:00 PM at the office of Dr. Kayleeann Huxford, [facility].   Please be advised, all patients with office appointments requiring lab work will take precedents over walk-in lab work.  If possible, please come for your lab work on Monday and Friday afternoons, as you may experience shorter wait times. The office is located at [address], [address], [place], [place] [address] No appointment is necessary.   Labs are drawn by [facility]. Please bring your co-pay at the time of your lab draw.  You may receive a bill from [facility] for your lab work.  If you wish to have your labs drawn at another location, please call the office 24 hours in advance to send orders.  If you have any questions regarding directions or hours of operation,  please call [phone].   As a reminder, please drink plenty of water prior to coming for your lab work. Thanks!   

## 2021-01-03 LAB — COMPLETE METABOLIC PANEL WITH GFR
AG Ratio: 1.2 (calc) (ref 1.0–2.5)
ALT: 6 U/L (ref 6–29)
AST: 10 U/L (ref 10–35)
Albumin: 3.9 g/dL (ref 3.6–5.1)
Alkaline phosphatase (APISO): 75 U/L (ref 37–153)
BUN/Creatinine Ratio: 22 (calc) (ref 6–22)
BUN: 30 mg/dL — ABNORMAL HIGH (ref 7–25)
CO2: 33 mmol/L — ABNORMAL HIGH (ref 20–32)
Calcium: 9.4 mg/dL (ref 8.6–10.4)
Chloride: 100 mmol/L (ref 98–110)
Creat: 1.38 mg/dL — ABNORMAL HIGH (ref 0.50–0.99)
GFR, Est African American: 46 mL/min/{1.73_m2} — ABNORMAL LOW (ref >=60)
GFR, Est Non African American: 39 mL/min/{1.73_m2} — ABNORMAL LOW (ref >=60)
Globulin: 3.3 g/dL (calc) (ref 1.9–3.7)
Glucose, Bld: 88 mg/dL (ref 65–99)
Potassium: 4.3 mmol/L (ref 3.5–5.3)
Sodium: 140 mmol/L (ref 135–146)
Total Bilirubin: 0.6 mg/dL (ref 0.2–1.2)
Total Protein: 7.2 g/dL (ref 6.1–8.1)

## 2021-01-03 LAB — CBC WITH DIFFERENTIAL/PLATELET
Absolute Monocytes: 479 cells/uL (ref 200–950)
Basophils Absolute: 39 cells/uL (ref 0–200)
Basophils Relative: 0.7 %
Eosinophils Absolute: 160 cells/uL (ref 15–500)
Eosinophils Relative: 2.9 %
HCT: 36.9 % (ref 35.0–45.0)
Hemoglobin: 11.7 g/dL (ref 11.7–15.5)
Lymphs Abs: 1172 cells/uL (ref 850–3900)
MCH: 28.3 pg (ref 27.0–33.0)
MCHC: 31.7 g/dL — ABNORMAL LOW (ref 32.0–36.0)
MCV: 89.1 fL (ref 80.0–100.0)
MPV: 11.8 fL (ref 7.5–12.5)
Monocytes Relative: 8.7 %
Neutro Abs: 3652 cells/uL (ref 1500–7800)
Neutrophils Relative %: 66.4 %
Platelets: 232 10*3/uL (ref 140–400)
RBC: 4.14 10*6/uL (ref 3.80–5.10)
RDW: 13 % (ref 11.0–15.0)
Total Lymphocyte: 21.3 %
WBC: 5.5 10*3/uL (ref 3.8–10.8)

## 2021-01-03 NOTE — Progress Notes (Signed)
CBC is normal.  Creatinine is better.

## 2021-01-16 DIAGNOSIS — R338 Other retention of urine: Secondary | ICD-10-CM | POA: Diagnosis not present

## 2021-01-17 ENCOUNTER — Other Ambulatory Visit: Payer: Self-pay | Admitting: Rheumatology

## 2021-01-17 NOTE — Telephone Encounter (Signed)
Last Visit: 01/02/2021 Next Visit: 02/13/2021 Labs: 2/92022, CBC is normal. Creatinine is better.  Current Dose per office note 01/02/2021, Arava 20mg  po qd DX: Rheumatoid arthritis involving multiple sites with positive rheumatoid factor   Last Fill: 10/24/2020  Okay to refill Arava?

## 2021-01-18 ENCOUNTER — Other Ambulatory Visit: Payer: Self-pay

## 2021-01-18 ENCOUNTER — Encounter (HOSPITAL_COMMUNITY): Payer: Self-pay | Admitting: Internal Medicine

## 2021-01-18 ENCOUNTER — Ambulatory Visit (HOSPITAL_COMMUNITY)
Admission: RE | Admit: 2021-01-18 | Discharge: 2021-01-18 | Disposition: A | Discharge status: Home / Self Care | Payer: Medicare HMO | Source: Ambulatory Visit | Attending: Internal Medicine | Admitting: Internal Medicine

## 2021-01-18 VITALS — BP 114/68 | HR 80 | Wt 183.6 lb

## 2021-01-18 DIAGNOSIS — Z8249 Family history of ischemic heart disease and other diseases of the circulatory system: Secondary | ICD-10-CM | POA: Diagnosis not present

## 2021-01-18 DIAGNOSIS — Z8673 Personal history of transient ischemic attack (TIA), and cerebral infarction without residual deficits: Secondary | ICD-10-CM | POA: Insufficient documentation

## 2021-01-18 DIAGNOSIS — I480 Paroxysmal atrial fibrillation: Secondary | ICD-10-CM | POA: Diagnosis not present

## 2021-01-18 DIAGNOSIS — I4819 Other persistent atrial fibrillation: Secondary | ICD-10-CM | POA: Insufficient documentation

## 2021-01-18 DIAGNOSIS — N1832 Chronic kidney disease, stage 3b: Secondary | ICD-10-CM | POA: Diagnosis not present

## 2021-01-18 DIAGNOSIS — J449 Chronic obstructive pulmonary disease, unspecified: Secondary | ICD-10-CM | POA: Insufficient documentation

## 2021-01-18 DIAGNOSIS — Z87891 Personal history of nicotine dependence: Secondary | ICD-10-CM | POA: Diagnosis not present

## 2021-01-18 DIAGNOSIS — I5022 Chronic systolic (congestive) heart failure: Secondary | ICD-10-CM | POA: Diagnosis not present

## 2021-01-18 DIAGNOSIS — N183 Chronic kidney disease, stage 3 unspecified: Secondary | ICD-10-CM | POA: Diagnosis not present

## 2021-01-18 DIAGNOSIS — I130 Hypertensive heart and chronic kidney disease with heart failure and stage 1 through stage 4 chronic kidney disease, or unspecified chronic kidney disease: Secondary | ICD-10-CM | POA: Diagnosis not present

## 2021-01-18 DIAGNOSIS — Z7952 Long term (current) use of systemic steroids: Secondary | ICD-10-CM | POA: Insufficient documentation

## 2021-01-18 DIAGNOSIS — I428 Other cardiomyopathies: Secondary | ICD-10-CM | POA: Insufficient documentation

## 2021-01-18 DIAGNOSIS — E785 Hyperlipidemia, unspecified: Secondary | ICD-10-CM | POA: Insufficient documentation

## 2021-01-18 DIAGNOSIS — I5082 Biventricular heart failure: Secondary | ICD-10-CM | POA: Diagnosis not present

## 2021-01-18 DIAGNOSIS — I10 Essential (primary) hypertension: Secondary | ICD-10-CM

## 2021-01-18 DIAGNOSIS — Z79899 Other long term (current) drug therapy: Secondary | ICD-10-CM | POA: Diagnosis not present

## 2021-01-18 MED ORDER — DAPAGLIFLOZIN PROPANEDIOL 10 MG PO TABS: 10.0000 mg | ORAL_TABLET | Freq: Every day | ORAL | 6 refills | Status: DC

## 2021-01-18 MED ORDER — FUROSEMIDE 80 MG PO TABS: 80.0000 mg | ORAL_TABLET | Freq: Every day | ORAL | 11 refills | Status: DC

## 2021-01-18 NOTE — Progress Notes (Addendum)
Advanced Heart Failure Clinic Note   PCP: Dr Wille Glaser  Primary Cardiologist: Dr. [name] Primary HF: [name] Urologist:  Nephrology: Dr Justin Mend EP:  Dr Curt Bears  HPI: Patricia Wagner is a 70 y.o. female with a past medical history of paroxysmal atrial fibrillation, NICM cardiomyopathy, Biventricular CHF, HTN, chronic foley due to urinary retentin, CKD Stage III, and HLD.   She has struggled with symptomatic afib since 10/2016.  Pt admitted to [facility] 6/18 with cardiogenic shock in the setting of persistent AF. Had tachy CM with EF 15% with severe biventricular dysfunction. Started on amio gtt and PICC line placed for dobutamine with low output HF. Underwent DCCV 05/06/17 but went back into Afib 05/08/17 and developed recurrent cardiogenic shock, requiring re-initiation of dobutamine and amio. Pt had repeat DCCV 05/12/17 with successful conversion to NSR. Discharged on amio and Ranexa.  Echo 8/18 EF 30-35%,severe left ventricular hypertrophy, mild to moderate mitral regurgitation, biatrial enlargement and moderately reduced RV function. Fat pad bx negative. PYP 10/18 felt equivocal but not favored for amyloid.    Admitted 2/19 with UTI and AKI. Found to have neurogenic bladder with urinary retention .Creatinine peaked at 7. Lasix, spiro, and entresto stopped. Required home IV antibiotics and discharged with foley. At the time of discharge creatinine was down to 1.07.   Admitted 4/19 with lower GI bleed. Colonoscopy showed diverticular disease. Tagged NM scan 03/15/18 with no active bleeding  Admitted 9/19. With acute hypoxic respiratory failure due a/c systolic/diastolic heart failure. Required short term intubation and diuresis with IV lasix.  Echo 8/20 EF 45-50%.    She is now status post AF ablation 10/13/2019. Eliquis stopped due to recurrent GI bleeding. Follows with Dr. Curt Bears  She presents today for regular follow up. We have not seen her in almost 2 years. Getting around pretty good. No  CP, palpitations or SOB. Struggling with bad left knee - getting injections. Had small stroke when they replaced her R knee in 5/21   Review of systems complete and found to be negative unless listed in HPI.    Past Medical History:  Diagnosis Date  . Acute blood loss anemia 03/05/2018  . Acute hypercapnic respiratory failure ([place]) 04/30/2017  . Acute renal failure superimposed on chronic kidney disease ([place]) 10/20/2016  . Arthritis    hands, knees  . Atrial fibrillation ([place])    a. s/p multiple cardioversions; failed tikosyn/sotalol.  . Bradycardia 11/20/2016  . CAD in native artery, s/p cardiac cath with non obstructive CAD 10/24/2016  . Cerebrovascular accident (CVA) due to embolism of left cerebellar artery ([place])   . CHF (congestive heart failure) ([place])   . Chronic anticoagulation 03/15/2018  . Chronic atrial fibrillation ([place]) 03/05/2018  . Chronic systolic (congestive) heart failure ([place]) 03/05/2018  . CKD (chronic kidney disease) stage 3, GFR 30-59 ml/min (HCC)   . Coagulopathy ([place]) 03/05/2018  . DIVERTICULITIS, HX OF 07/25/2007  . Diverticulosis of colon with hemorrhage 07/22/2007   Qualifier: Diagnosis of  By: [name]    . DIVERTICULOSIS, COLON 07/22/2007  . Dysuria 07/21/2017  . Edema, peripheral    a. chronic BLE edema, R>L. Prior trauma from dog attack and accident.  . Essential hypertension 07/22/2007   Qualifier: Diagnosis of  By: [name]    . History of kidney stones   . HLD (hyperlipidemia) 02/03/2008   Qualifier: Diagnosis of  By: [name], [name]   . HYPERLIPIDEMIA 02/03/2008  . Hypersomnia    declines w/u  . Hypertension   .  Hypotension (arterial) 04/30/2017  . MENOPAUSAL DISORDER 01/09/2011  . Morbid obesity ([place]) 07/22/2007  . NICM (nonischemic cardiomyopathy) ([place]) 10/24/2016  . PAF (paroxysmal atrial fibrillation) ([place])   . Presence of indwelling urinary catheter    supra pubic cath  . Raynaud's syndrome 07/22/2007  . Stroke ([place]) 2017  . Suspected  sleep apnea 05/20/2017  . THYROID NODULE, RIGHT 01/04/2010  . VITAMIN D DEFICIENCY 01/09/2011   Qualifier: Diagnosis of  By: [name], [name]     Current Outpatient Medications  Medication Sig Dispense Refill  . Acetaminophen (TYLENOL PO) Take by mouth as needed.     . Cholecalciferol (VITAMIN D3) 25 MCG (1000 UT) CAPS Take 1,000 Units by mouth daily.    . furosemide (LASIX) 80 MG tablet Take 1 tablet (80 mg total) by mouth 2 (two) times daily. FINAL REFILL WITHOUT OFFICE VISIT PLEASE CALL [phone] 60 tablet 0  . isosorbide mononitrate (IMDUR) 30 MG 24 hr tablet TAKE 1/2 TABLET BY MOUTH EVERY DAY 45 tablet 1  . leflunomide (ARAVA) 20 MG tablet TAKE 1 TABLET BY MOUTH EVERY DAY 30 tablet 2  . pantoprazole (PROTONIX) 40 MG tablet TAKE 1 TABLET BY MOUTH EVERY DAY 90 tablet 3  . Potassium Chloride ER 20 MEQ TBCR Take 1 tablet by mouth daily.    . predniSONE (DELTASONE) 5 MG tablet Take 1 tablet (5 mg total) by mouth daily with breakfast. 60 tablet 0  . ranolazine (RANEXA) 500 MG 12 hr tablet Take 1 tablet (500 mg total) by mouth 2 (two) times daily. 180 tablet 3  . rosuvastatin (CRESTOR) 10 MG tablet TAKE 1 TABLET BY MOUTH EVERY DAY 90 tablet 3   No current facility-administered medications for this encounter.    Allergies  Allergen Reactions  . Ace Inhibitors Palpitations     Social History   Socioeconomic History  . Marital status: Single    Spouse name: Not on file  . Number of children: 1  . Years of education: Not on file  . Highest education level: Not on file  Occupational History  . Occupation: Academic librarian   Tobacco Use  . Smoking status: Former Smoker    Packs/day: 0.50    Types: Cigarettes    Start date: 2019  . Smokeless tobacco: Never Used  Vaping Use  . Vaping Use: Never used  Substance and Sexual Activity  . Alcohol use: Not Currently  . Drug use: No  . Sexual activity: Not on file  Other Topics Concern  . Not on file  Social History Narrative  . Not on file    Social Determinants of Health   Financial Resource Strain: Not on file  Food Insecurity: Not on file  Transportation Needs: Not on file  Physical Activity: Not on file  Stress: Not on file  Social Connections: Not on file  Intimate Partner Violence: Not on file     Family History  Problem Relation Age of Onset  . Asthma Mother   . Diabetes Father   . Heart disease Father        Died of presumed heart attack - 81s  . Lung disease Sister   . Heart disease Sister        Twin sister has heart issue, unclear what kind  . Healthy Daughter   . Thyroid disease Neg Hx   . Colon polyps Neg Hx   . Esophageal cancer Neg Hx   . Pancreatic cancer Neg Hx   . Stomach cancer Neg Hx  Vitals:   01/18/21 1407  BP: 114/68  Pulse: 80  SpO2: 100%  Weight: 83.3 kg (183 lb 9.6 oz)   Wt Readings from Last 3 Encounters:  01/18/21 83.3 kg (183 lb 9.6 oz)  01/02/21 82.1 kg (181 lb)  09/18/20 79.4 kg (175 lb)    PHYSICAL EXAM: General:  Well appearing. No resp difficulty HEENT: normal Neck: supple. no JVD. Carotids 2+ bilat; no bruits. No lymphadenopathy or thryomegaly appreciated. Cor: PMI nondisplaced. Regular rate & rhythm. No rubs, gallops or murmurs. Lungs: clear Abdomen: soft, nontender, nondistended. No hepatosplenomegaly. No bruits or masses. Good bowel sounds. Extremities: no cyanosis, clubbing, rash, edema Neuro: alert & orientedx3, cranial nerves grossly intact. moves all 4 extremities w/o difficulty. Affect pleasant  ASSESSMENT & PLAN:  1. Chronic biventricular CHF due to NICM (suspect AF cardiomyopathy) - Echo 6/18 EF 15% in setting of tachy CM from AF - Echo 02/2018: EF 40-45%, grade 1 DD, RV normal - Echo 8/20 45-50% Personally reviewed - Amyloid w/u negative with negative fat pad biopsy and PYP scan  - Stable NYHA II-early III symptoms.  - Volume status looks good - Add Farxiga 10. Decrease lasix 80 bid to 80 daily  - Off hydralazine due to low BP - Stop Imdur -  has been off spiro and Entresto with AKI/CKD - now much improved - Will repeat echo if EF < 50% can reconsider spiro/ARNA  2. H/o diverticular GI bleed in 4/19 - colonoscopy 1/19 with diverticular disease - Tagged NM scan 03/15/18 negative - No evidence of rebleeding - Off Eliquis   3. Persistent AF:  Cannot tolerate AF with recurrent shock. S/p DCCV on 03/25/17 (she stopped Amio after this) and DCCV on 05/06/17 and again 05/12/17. - She absolutely cannot tolerate AF with recurrent cardiogenic shock when AF recurs.  - She is now status post AF ablation 10/13/2019. Eliquis stopped due to recurrent GI bleeding. Follows with Dr. Curt Bears - Off amio. Can stop Ranexa.   4. Suspected Sleep Apnea: - Refuses sleep study. No change.   5.  COPD:  - No wheeze on exam. No wheezing.  6. CKD IIIb - baseline 1.4-1.9 - Sees Dr. Justin Mend.  - Consider SGLT2i for renal protection   7. HTN - Blood pressure well controlled.   8. H/o CVA - off Eliquis due to recurrent GI bleeding  Glori Bickers, MD 01/18/21

## 2021-01-18 NOTE — Addendum Note (Signed)
Encounter addended by: Scarlette Calico, RN on: 01/18/2021 2:49 PM  Actions taken: Medication long-term status modified, Alternative orders not taken and original order placed, Diagnosis association updated, Order list changed, Clinical Note Signed

## 2021-01-18 NOTE — Patient Instructions (Signed)
Stop Ranexa  Stop Isosorbide  Decrease Furosemide to 80 mg Daily  Start Farxiga 10 mg Daily  Your physician recommends that you return for lab work in: 2 weeks  Please call our office in July 2022 to schedule your follow up appointment and echocardiogram  If you have any questions or concerns before your next appointment please send [place] a message through [place] or call our office at [phone].    TO LEAVE A MESSAGE FOR THE NURSE SELECT OPTION 2, PLEASE LEAVE A MESSAGE INCLUDING: . YOUR NAME . DATE OF BIRTH . CALL BACK NUMBER . REASON FOR CALL**this is important as we prioritize the call backs  [place] AS LONG AS YOU CALL BEFORE 4:00 PM  At the [facility], you and your health needs are our priority. As part of our continuing mission to provide you with exceptional heart care, we have created designated Provider Care Teams. These Care Teams include your primary Cardiologist (physician) and Advanced Practice Providers (APPs- Physician Assistants and Nurse Practitioners) who all work together to provide you with the care you need, when you need it.   You may see any of the following providers on your designated Care Team at your next follow up: Marland Kitchen Dr Glori Bickers . Dr Loralie Champagne . Dr Vickki Muff . Darrick Grinder, NP . Lyda Jester, [place] . Audry Riles, PharmD   Please be sure to bring in all your medications bottles to every appointment.

## 2021-01-18 NOTE — Addendum Note (Signed)
Encounter addended by: [name], MD on: 01/18/2021 2:37 PM  Actions taken: Vitals modified, Clinical Note Signed

## 2021-01-28 ENCOUNTER — Telehealth (HOSPITAL_COMMUNITY): Payer: Self-pay | Admitting: *Deleted

## 2021-01-28 DIAGNOSIS — R338 Other retention of urine: Secondary | ICD-10-CM | POA: Diagnosis not present

## 2021-01-28 DIAGNOSIS — N3000 Acute cystitis without hematuria: Secondary | ICD-10-CM | POA: Diagnosis not present

## 2021-01-28 MED ORDER — EMPAGLIFLOZIN 10 MG PO TABS: 10.0000 mg | ORAL_TABLET | Freq: Every day | ORAL | 6 refills | Status: DC

## 2021-01-28 NOTE — Telephone Encounter (Signed)
Left message to call back  

## 2021-01-28 NOTE — Telephone Encounter (Signed)
-----   Message from Charlann Boxer, CPhT sent at 01/21/2021  9:12 AM EST ----- Wilder Glade is $95, Vania Rea is $45 (30 days). I would switch her to [place]. Unless she needs patient assistance, then I would stick with [place].  Thanks  Kathlee Nations  ----- Message ----- From: Scarlette Calico, RN Sent: 01/18/2021   2:44 PM EST To: Charlann Boxer, CPhT  Hey we started her on Farxiga, I gave her a 30 day free card, thanks

## 2021-01-28 NOTE — Telephone Encounter (Signed)
Spoke w/pt regarding pricing, she states she can afford the $45 copay, advised her to finish the [place] then get new rx for Jardiance, she is agreeable.

## 2021-01-28 NOTE — Telephone Encounter (Signed)
Attempted to call pt to discuss cost and Left message to call back

## 2021-01-31 NOTE — Progress Notes (Signed)
Office Visit Note  Patient: Patricia Wagner             Date of Birth: 1951-01-16           MRN: [id number]             PCP: No primary care provider on file. Referring: Patricia Coss, NP Visit Date: 02/13/2021 Occupation: _0 @  Subjective:  Right hand numbness   History of Present Illness: Patricia Wagner is a 70 y.o. female with history of seropositive rheumatoid arthritis and osteoarthritis.  Patient is taking Arava 20 mg 1 tablet by mouth daily and prednisone 5 mg by mouth daily.  She is tolerating both medications without any side effects.  She has noticed improvement since starting on prednisone after her last office visit on 01/02/21. She denies any joint swelling currently. She has been experiencing daytime and noctural numbness in her right 1st, 2nd, and 3rd digits. She has also noticed increased hand cramping. She denies any joint tenderness or joint swelling.  She has been experiencing muscle cramps in bilateral lower extremities.  She has been trying to stay hydrated. She states her right knee replacement is doing ok.  She states her left knee joint pain has improved since having orthovisc injections in November 2021.  She denies any recent falls or fractures.  She uses a cane to assist with ambulation.  She sees her nephrologist on a yearly basis.  She has not had any recent infections.    Activities of Daily Living:  Patient reports morning stiffness for 0 minutes.    Patient Denies nocturnal pain.  Difficulty dressing/grooming: Denies Difficulty climbing stairs: Denies Difficulty getting out of chair: Denies Difficulty using hands for taps, buttons, cutlery, and/or writing: Reports  Review of Systems  Constitutional: Negative for fatigue.  HENT: Negative for mouth sores, mouth dryness and nose dryness.   Eyes: Negative for pain, itching and dryness.  Respiratory: Negative for shortness of breath and difficulty breathing.   Cardiovascular: Negative for chest pain and  palpitations.  Gastrointestinal: Negative for blood in stool, constipation and diarrhea.  Endocrine: Negative for increased urination.  Genitourinary: Negative for difficulty urinating.  Musculoskeletal: Positive for joint swelling. Negative for arthralgias, joint pain, myalgias, morning stiffness, muscle tenderness and myalgias.  Skin: Negative for color change, rash and redness.  Allergic/Immunologic: Negative for susceptible to infections.  Neurological: Positive for parasthesias. Negative for dizziness, numbness, headaches, memory loss and weakness.  Hematological: Negative for bruising/bleeding tendency.  Psychiatric/Behavioral: Negative for confusion.    PMFS History:  Patient Active Problem List   Diagnosis Date Noted  . Acute GI bleeding 05/14/2020  . Primary osteoarthritis of right knee 03/20/2020  . History of lower GI bleeding 03/20/2020  . Acute lower GI bleeding 01/18/2020  . Acquired thrombophilia ([place]) 11/14/2019  . Persistent atrial fibrillation ([place]) 10/13/2019  . Allergic rhinitis 09/10/2019  . Left ear pain 09/10/2019  . Vitamin D deficiency 04/15/2019  . Cardiogenic shock ([place])   . Acute on chronic respiratory failure with hypoxia ([place])   . Elevated troponin 08/05/2018  . Hyperbilirubinemia 08/05/2018  . Abnormal transaminases 08/05/2018  . Dysarthria 08/05/2018  . Lower GI bleed 03/15/2018  . Chronic anticoagulation 03/15/2018  . Rectal bleed   . Acute renal failure superimposed on stage 4 chronic kidney disease ([place]) 03/05/2018  . Coagulopathy ([place]) 03/05/2018  . Chronic systolic (congestive) heart failure ([place]) 03/05/2018  . Chronic atrial fibrillation 03/05/2018  . Acute blood loss anemia 03/05/2018  . Diverticulosis 03/05/2018  .  Heme positive stool 03/05/2018  . Urine abnormality 02/02/2018  . Cough 02/02/2018  . Bacteremia due to Gram-negative bacteria 12/30/2017  . Increased anion gap metabolic acidosis 57/26/2035  . Metabolic alkalosis  59/74/1638  . Acute UTI 12/29/2017  . Obstructive uropathy   . Anemia 12/17/2017  . AKI (acute kidney injury) ([place]) 11/30/2017  . Diarrhea 11/30/2017  . Urinary retention 11/23/2017  . Lower abdominal pain 11/23/2017  . Dysuria 07/21/2017  . Suspected sleep apnea 05/20/2017  . Pressure injury of skin 05/02/2017  . Acute respiratory failure with hypoxia ([place]) 04/30/2017  . Hypotension (arterial) 04/30/2017  . Bradycardia 11/20/2016  . Chronic combined systolic (congestive) and diastolic (congestive) heart failure ([place]) 11/20/2016  . Cerebrovascular accident (CVA) due to embolism of left cerebellar artery ([place])   . PAF (paroxysmal atrial fibrillation) ([place])   . CHF (congestive heart failure) ([place]) 10/31/2016  . CAD in native artery, s/p cardiac cath with non obstructive CAD 10/24/2016  . NICM (nonischemic cardiomyopathy) ([place]) 10/24/2016  . Tobacco abuse 10/20/2016  . Acute renal failure superimposed on chronic kidney disease ([place]) 10/20/2016  . Hematochezia 06/29/2013  . Degenerative arthritis of left knee 03/15/2012  . Encounter for well adult exam with abnormal findings 03/12/2012  . Hypersomnia   . MENOPAUSAL DISORDER 01/09/2011  . THYROID NODULE, RIGHT 01/04/2010  . HLD (hyperlipidemia) 02/03/2008  . LEG CRAMPS, NOCTURNAL 02/03/2008  . LOW BACK PAIN 07/25/2007  . Morbid obesity ([place]) 07/22/2007  . Essential hypertension 07/22/2007  . Raynaud's syndrome 07/22/2007  . Diverticulosis of colon with hemorrhage 07/22/2007  . SYMPTOM, EDEMA 07/22/2007    Past Medical History:  Diagnosis Date  . Acute blood loss anemia 03/05/2018  . Acute hypercapnic respiratory failure ([place]) 04/30/2017  . Acute renal failure superimposed on chronic kidney disease ([place]) 10/20/2016  . Arthritis    hands, knees  . Atrial fibrillation ([place])    a. s/p multiple cardioversions; failed tikosyn/sotalol.  . Bradycardia 11/20/2016  . CAD in native artery, s/p cardiac cath with non obstructive CAD  10/24/2016  . Cerebrovascular accident (CVA) due to embolism of left cerebellar artery ([place])   . CHF (congestive heart failure) ([place])   . Chronic anticoagulation 03/15/2018  . Chronic atrial fibrillation ([place]) 03/05/2018  . Chronic systolic (congestive) heart failure ([place]) 03/05/2018  . CKD (chronic kidney disease) stage 3, GFR 30-59 ml/min (HCC)   . Coagulopathy ([place]) 03/05/2018  . DIVERTICULITIS, HX OF 07/25/2007  . Diverticulosis of colon with hemorrhage 07/22/2007   Qualifier: Diagnosis of  By: [name]    . DIVERTICULOSIS, COLON 07/22/2007  . Dysuria 07/21/2017  . Edema, peripheral    a. chronic BLE edema, R>L. Prior trauma from dog attack and accident.  . Essential hypertension 07/22/2007   Qualifier: Diagnosis of  By: [name]    . History of kidney stones   . HLD (hyperlipidemia) 02/03/2008   Qualifier: Diagnosis of  By: [name], [name]   . HYPERLIPIDEMIA 02/03/2008  . Hypersomnia    declines w/u  . Hypertension   . Hypotension (arterial) 04/30/2017  . MENOPAUSAL DISORDER 01/09/2011  . Morbid obesity ([place]) 07/22/2007  . NICM (nonischemic cardiomyopathy) ([place]) 10/24/2016  . PAF (paroxysmal atrial fibrillation) ([place])   . Presence of indwelling urinary catheter    supra pubic cath  . Raynaud's syndrome 07/22/2007  . Stroke ([place]) 2017  . Suspected sleep apnea 05/20/2017  . THYROID NODULE, RIGHT 01/04/2010  . VITAMIN D DEFICIENCY 01/09/2011   Qualifier: Diagnosis of  By: [name],  [name]     Family History  Problem Relation Age of Onset  . Asthma Mother   . Diabetes Father   . Heart disease Father        Died of presumed heart attack - 51s  . Lung disease Sister   . Heart disease Sister        Twin sister has heart issue, unclear what kind  . Healthy Daughter   . Thyroid disease Neg Hx   . Colon polyps Neg Hx   . Esophageal cancer Neg Hx   . Pancreatic cancer Neg Hx   . Stomach cancer Neg Hx    Past Surgical History:  Procedure Laterality Date  . ATRIAL  FIBRILLATION ABLATION N/A 10/13/2019   Procedure: ATRIAL FIBRILLATION ABLATION;  Surgeon: [name], MD;  Location: [place] [facility];  Service: Cardiovascular;  Laterality: N/A;  . CARDIAC CATHETERIZATION N/A 10/23/2016   Procedure: Left Heart Cath and Coronary Angiography;  Surgeon: [name], MD;  Location: [place] [facility];  Service: Cardiovascular;  Laterality: N/A;  . CARDIOVERSION N/A 10/31/2016   Procedure: CARDIOVERSION;  Surgeon: [name], MD;  Location: [place];  Service: Cardiovascular;  Laterality: N/A;  . CARDIOVERSION N/A 11/03/2016   Procedure: CARDIOVERSION;  Surgeon: [name], MD;  Location: [facility] [facility];  Service: Cardiovascular;  Laterality: N/A;  . CARDIOVERSION N/A 11/18/2016   Procedure: CARDIOVERSION;  Surgeon: [name], MD;  Location: [place];  Service: Cardiovascular;  Laterality: N/A;  . CARDIOVERSION N/A 03/25/2017   Procedure: CARDIOVERSION;  Surgeon: [name], MD;  Location: [facility] ENDOSCOPY;  Service: Cardiovascular;  Laterality: N/A;  . CARDIOVERSION N/A 05/06/2017   Procedure: CARDIOVERSION;  Surgeon: [name], MD;  Location: [facility] ENDOSCOPY;  Service: Cardiovascular;  Laterality: N/A;  . CARDIOVERSION N/A 05/12/2017   Procedure: CARDIOVERSION;  Surgeon: [name], MD;  Location: [facility] ENDOSCOPY;  Service: Cardiovascular;  Laterality: N/A;  . COLONOSCOPY N/A 12/04/2017   Procedure: COLONOSCOPY;  Surgeon: [name], MD;  Location: [place];  Service: Endoscopy;  Laterality: N/A;  . COLONOSCOPY    . COLONOSCOPY W/ POLYPECTOMY  02/2011   pan diverticulosis.  tubular adenoma without dysplasia on 5 mm sigmoid polyp.  Dr Fuller Plan.    . ESOPHAGOGASTRODUODENOSCOPY (EGD) WITH PROPOFOL N/A 05/16/2020   Procedure: ESOPHAGOGASTRODUODENOSCOPY (EGD) WITH PROPOFOL;  Surgeon: Milus Banister, MD;  Location: [facility] ENDOSCOPY;  Service: Endoscopy;  Laterality: N/A;  . FLEXIBLE SIGMOIDOSCOPY N/A 01/18/2020    Procedure: FLEXIBLE SIGMOIDOSCOPY;  Surgeon: [name], DO;  Location: [place];  Service: Gastroenterology;  Laterality: N/A;  . HEMOSTASIS CLIP PLACEMENT  01/18/2020   Procedure: HEMOSTASIS CLIP PLACEMENT;  Surgeon: [name], DO;  Location: [place];  Service: Gastroenterology;;  . HOT HEMOSTASIS N/A 05/16/2020   Procedure: HOT HEMOSTASIS (ARGON PLASMA COAGULATION/BICAP);  Surgeon: Milus Banister, MD;  Location: [facility];  Service: Endoscopy;  Laterality: N/A;  . PARTIAL HYSTERECTOMY     1 OVARY LEFT  . SCLEROTHERAPY  01/18/2020   Procedure: SCLEROTHERAPY;  Surgeon: [name], DO;  Location: [facility] ENDOSCOPY;  Service: Gastroenterology;;  . TEE WITHOUT CARDIOVERSION N/A 10/31/2016   Procedure: TRANSESOPHAGEAL ECHOCARDIOGRAM (TEE);  Surgeon: [name], MD;  Location: [facility]OSCOPY;  Service: Cardiovascular;  Laterality: N/A;  . TEE WITHOUT CARDIOVERSION N/A 03/25/2017   Procedure: TRANSESOPHAGEAL ECHOCARDIOGRAM (TEE);  Surgeon: [name], MD;  Location: [facility] ENDOSCOPY;  Service: Cardiovascular;  Laterality: N/A;  . TOTAL KNEE ARTHROPLASTY Right  04/10/2020   Procedure: TOTAL KNEE ARTHROPLASTY;  Surgeon: Renette Butters, MD;  Location: [facility] ORS;  Service: Orthopedics;  Laterality: Right;   Social History   Social History Narrative  . Not on file   Immunization History  Administered Date(s) Administered  . Fluad Quad(high Dose 65+) 09/07/2019, 09/17/2020  . Influenza Whole 01/09/2011  . Influenza, High Dose Seasonal PF 08/30/2018  . Influenza,inj,Quad PF,6+ Mos 10/25/2015, 10/24/2016, 07/21/2017  . PFIZER Comirnaty(Gray Top)Covid-19 Tri-Sucrose Vaccine 12/17/2020  . PFIZER(Purple Top)SARS-COV-2 Vaccination 04/28/2020, 05/24/2020  . Pneumococcal Conjugate-13 04/15/2019  . Pneumococcal Polysaccharide-23 10/24/2016, 03/18/2018  . Td 02/02/2009  . Tdap 04/15/2019     Objective: Vital Signs: BP 123/83 (BP Location: Left Arm, Patient Position:  Sitting, Cuff Size: Normal)   Pulse 87   Resp 15   Ht 5' 5" (1.651 m)   Wt 188 lb (85.3 kg)   BMI 31.28 kg/m    Physical Exam Vitals and nursing note reviewed.  Constitutional:      Appearance: She is well-developed.  HENT:     Head: Normocephalic and atraumatic.  Eyes:     Conjunctiva/sclera: Conjunctivae normal.  Pulmonary:     Effort: Pulmonary effort is normal.  Abdominal:     Palpations: Abdomen is soft.  Musculoskeletal:     Cervical back: Normal range of motion.     Comments: Rheumatoid nodules palpable on the extensor surface of both elbows.  Skin:    General: Skin is warm and dry.     Capillary Refill: Capillary refill takes less than 2 seconds.  Neurological:     Mental Status: She is alert and oriented to person, place, and time.  Psychiatric:        Behavior: Behavior normal.      Musculoskeletal Exam: C-spine, thoracic spine, and lumbar spine good ROM.  She has discomfort and stiffness in both shoulder joints with range of motion.  Tenderness palpation over both shoulders noted. Rheumatoid nodules over bilateral elbows.  Elbow joints, wrist joints, MCPs, PIPs, and DIPs good ROM with no synovitis.  Hip joints good ROM with no discomfort.  Right knee replacement has good ROM with no discomfort.  Left knee joint has good ROM with no discomfort.  Left knee crepitus noted.  Ankle joints good ROM with no tenderness or inflammation.   CDAI Exam: CDAI Score: - Patient Global: -; Provider Global: - Swollen: -; Tender: - Joint Exam 02/13/2021   No joint exam has been documented for this visit   There is currently no information documented on the homunculus. Go to the Rheumatology activity and complete the homunculus joint exam.  Investigation: No additional findings.  Imaging: No results found.  Recent Labs: Lab Results  Component Value Date   WBC 6.5 02/01/2021   HGB 11.9 02/01/2021   PLT 211 02/01/2021   NA 137 02/01/2021   K 4.3 02/01/2021   CL 97 (L)  02/01/2021   CO2 33 (H) 02/01/2021   GLUCOSE 91 02/01/2021   BUN 25 02/01/2021   CREATININE 1.37 (H) 02/01/2021   BILITOT 0.4 02/01/2021   ALKPHOS 83 05/03/2020   AST 11 02/01/2021   ALT 7 02/01/2021   PROT 7.1 02/01/2021   ALBUMIN 2.8 (L) 05/03/2020   CALCIUM 9.0 02/01/2021   GFRAA 46 (L) 02/01/2021    Speciality Comments: No specialty comments available.  Procedures:  No procedures performed Allergies: Ace inhibitors   Assessment / Plan:     Visit Diagnoses: Rheumatoid arthritis involving multiple sites with positive rheumatoid  factor ([place]) - with nodulosis and severe inflammatory arthritis with RF, positive anti-CCP, positive ANA, elevated ESR: She has noticed a significant improvement in her symptoms since adding on prednisone 5 mg 1 tablet by mouth daily at her last office visit on 01/02/2021.  She has continued to take Arava 20 mg 1 tablet by mouth daily and is tolerating it without any side effects.  Her treatment options are limited due to her history of CHF she is not a good candidate for anti-TNF's.  She is also not a good candidate for methotrexate due to chronic kidney disease stage III.  She has a history of sepsis so we are apprehensive to add more aggressive immunosuppression at this time. According to the patient the pain and stiffness in her hands has resolved.  She has been experiencing some increased hand cramping full numbness in her first 3 digits.  She declined a referral for NCV with EMG.  She was given a prescription for a carpal tunnel night splint.  She was also encouraged to use arthritis compression gloves.  She will continue on the current treatment regimen.  If her joint pain and inflammation improve we will try tapering prednisone in the future.  She will follow-up in the office in 3 to 4 months.  Rheumatoid nodulosis [name]): Rheumatoid nodules palpable on the extensor surface of both elbows.  High risk medication use - Arava 20 mg 1 tablet by mouth daily and  prednisone 5 mg 1 tablet by mouth daily.  She is aware of the long-term risks of staying on systemic prednisone including CHF exacerbations and osteoporosis. CBC and CMP were updated on 02/01/2021.  Her next lab work will be due in June and every 3 months to monitor for toxicity.  Standing orders for CBC and CMP are in place. She has not had any recent infections. She has received 3 Pfizer COVID-19 vaccine doses and is not eligible for the fourth dose yet.  Positive ANA (antinuclear antibody) - ENA negative, C3-C4 normal.  She has no clinical features of systemic lupus.  Raynaud's disease without gangrene: Not currently active.  No signs of digital ulcerations or sclerodactyly noted.  Primary osteoarthritis of left knee - s/p orthovisc left knee 09/2020.  She notices significant improvement in her knee joint pain and stiffness after completing the Orthovisc series in November 2021.  She will be eligible to reapply for gel injections in May 2022.  She was advised to notify us if she develops increased joint pain or stiffness in her left knee joint.  Status post total knee replacement, right -She is not experiencing any increased discomfort in her right knee replacement.  She continues to walk with a cane to assist with ambulation.  Hyperuricemia - June 19, 2000 uric acid 9.1.  There is no history of gout.  Stage 3b chronic kidney disease [facility]): She sees her nephrologist, Dr. Justin Mend, on a yearly basis.  Lab work from 02/01/2021 was reviewed with the patient today in the office.  Other medical conditions are listed as follows:  Chronic systolic congestive heart failure ([place]): Followed by [name].  Office visit not from 01/18/21 was reviewed today.   CAD in native artery, s/p cardiac cath with non obstructive CAD  Diverticulosis of colon with hemorrhage  Chronic anticoagulation  Bacteremia due to Gram-negative bacteria - patient does not recall when she had sepsis.  Vitamin D  deficiency  NICM (nonischemic cardiomyopathy) ([place])  Cerebrovascular accident (CVA) due to embolism of left cerebellar artery ([place])  Chronic atrial fibrillation  Essential hypertension  Osteoporosis screening - patient wants to discuss DEXA scan with her PCP.  Orders: No orders of the defined types were placed in this encounter.  No orders of the defined types were placed in this encounter.    Follow-Up Instructions: Return in about 4 months (around 06/15/2021) for Rheumatoid arthritis.   Ofilia Neas, PA-C  Note - This record has been created using Dragon software.  Chart creation errors have been sought, but may not always  have been located. Such creation errors do not reflect on  the standard of medical care.

## 2021-02-01 ENCOUNTER — Other Ambulatory Visit (HOSPITAL_COMMUNITY): Payer: Medicare HMO

## 2021-02-01 ENCOUNTER — Other Ambulatory Visit: Payer: Self-pay

## 2021-02-01 ENCOUNTER — Ambulatory Visit (HOSPITAL_COMMUNITY)
Admission: RE | Admit: 2021-02-01 | Discharge: 2021-02-01 | Disposition: A | Discharge status: Home / Self Care | Payer: Medicare HMO | Source: Ambulatory Visit | Attending: Internal Medicine | Admitting: Internal Medicine

## 2021-02-01 DIAGNOSIS — Z79899 Other long term (current) drug therapy: Secondary | ICD-10-CM | POA: Diagnosis not present

## 2021-02-01 DIAGNOSIS — I5022 Chronic systolic (congestive) heart failure: Secondary | ICD-10-CM | POA: Insufficient documentation

## 2021-02-01 LAB — BASIC METABOLIC PANEL
Anion gap: 8 (ref 5–15)
BUN: 21 mg/dL (ref 8–23)
CO2: 29 mmol/L (ref 22–32)
Calcium: 8.6 mg/dL — ABNORMAL LOW (ref 8.9–10.3)
Chloride: 99 mmol/L (ref 98–111)
Creatinine, Ser: 1.36 mg/dL — ABNORMAL HIGH (ref 0.44–1.00)
GFR, Estimated: 43 mL/min — ABNORMAL LOW (ref >60)
Glucose, Bld: 95 mg/dL (ref 70–99)
Potassium: 3.9 mmol/L (ref 3.5–5.1)
Sodium: 136 mmol/L (ref 135–145)

## 2021-02-02 LAB — COMPLETE METABOLIC PANEL WITH GFR
AG Ratio: 1.3 (calc) (ref 1.0–2.5)
ALT: 7 U/L (ref 6–29)
AST: 11 U/L (ref 10–35)
Albumin: 4 g/dL (ref 3.6–5.1)
Alkaline phosphatase (APISO): 76 U/L (ref 37–153)
BUN/Creatinine Ratio: 18 (calc) (ref 6–22)
BUN: 25 mg/dL (ref 7–25)
CO2: 33 mmol/L — ABNORMAL HIGH (ref 20–32)
Calcium: 9 mg/dL (ref 8.6–10.4)
Chloride: 97 mmol/L — ABNORMAL LOW (ref 98–110)
Creat: 1.37 mg/dL — ABNORMAL HIGH (ref 0.50–0.99)
GFR, Est African American: 46 mL/min/{1.73_m2} — ABNORMAL LOW (ref >=60)
GFR, Est Non African American: 40 mL/min/{1.73_m2} — ABNORMAL LOW (ref >=60)
Globulin: 3.1 g/dL (calc) (ref 1.9–3.7)
Glucose, Bld: 91 mg/dL (ref 65–99)
Potassium: 4.3 mmol/L (ref 3.5–5.3)
Sodium: 137 mmol/L (ref 135–146)
Total Bilirubin: 0.4 mg/dL (ref 0.2–1.2)
Total Protein: 7.1 g/dL (ref 6.1–8.1)

## 2021-02-02 LAB — CBC WITH DIFFERENTIAL/PLATELET
Absolute Monocytes: 462 cells/uL (ref 200–950)
Basophils Absolute: 33 cells/uL (ref 0–200)
Basophils Relative: 0.5 %
Eosinophils Absolute: 39 cells/uL (ref 15–500)
Eosinophils Relative: 0.6 %
HCT: 36.4 % (ref 35.0–45.0)
Hemoglobin: 11.9 g/dL (ref 11.7–15.5)
Lymphs Abs: 631 cells/uL — ABNORMAL LOW (ref 850–3900)
MCH: 29 pg (ref 27.0–33.0)
MCHC: 32.7 g/dL (ref 32.0–36.0)
MCV: 88.8 fL (ref 80.0–100.0)
MPV: 11.7 fL (ref 7.5–12.5)
Monocytes Relative: 7.1 %
Neutro Abs: 5337 cells/uL (ref 1500–7800)
Neutrophils Relative %: 82.1 %
Platelets: 211 10*3/uL (ref 140–400)
RBC: 4.1 10*6/uL (ref 3.80–5.10)
RDW: 12.9 % (ref 11.0–15.0)
Total Lymphocyte: 9.7 %
WBC: 6.5 10*3/uL (ref 3.8–10.8)

## 2021-02-03 NOTE — Progress Notes (Signed)
GFR is low and stable.  Most likely due to the use of Lasix.  Lymphocyte count is low due to immunosuppressive therapy.

## 2021-02-06 ENCOUNTER — Other Ambulatory Visit: Payer: Self-pay | Admitting: Rheumatology

## 2021-02-06 NOTE — Telephone Encounter (Signed)
Next Visit: 02/13/2021  Last Visit: 01/02/2021  Last Fill:01/02/2021  Dx: Rheumatoid arthritis involving multiple sites with positive rheumatoid factor   Current Dose per office note on 01/02/2021,  prednisone 5 mg p.o. daily.  Okay to refill prednisone?

## 2021-02-09 ENCOUNTER — Other Ambulatory Visit: Payer: Self-pay | Admitting: Physician Assistant

## 2021-02-13 ENCOUNTER — Other Ambulatory Visit: Payer: Self-pay

## 2021-02-13 ENCOUNTER — Ambulatory Visit: Payer: Medicare HMO | Admitting: Physician Assistant

## 2021-02-13 ENCOUNTER — Encounter: Payer: Self-pay | Admitting: Physician Assistant

## 2021-02-13 VITALS — BP 123/83 | HR 87 | Resp 15 | Ht 65.0 in | Wt 188.0 lb

## 2021-02-13 DIAGNOSIS — Z13820 Encounter for screening for osteoporosis: Secondary | ICD-10-CM

## 2021-02-13 DIAGNOSIS — I482 Chronic atrial fibrillation, unspecified: Secondary | ICD-10-CM

## 2021-02-13 DIAGNOSIS — I5022 Chronic systolic (congestive) heart failure: Secondary | ICD-10-CM

## 2021-02-13 DIAGNOSIS — N1832 Chronic kidney disease, stage 3b: Secondary | ICD-10-CM

## 2021-02-13 DIAGNOSIS — I63442 Cerebral infarction due to embolism of left cerebellar artery: Secondary | ICD-10-CM

## 2021-02-13 DIAGNOSIS — K5731 Diverticulosis of large intestine without perforation or abscess with bleeding: Secondary | ICD-10-CM

## 2021-02-13 DIAGNOSIS — I7300 Raynaud's syndrome without gangrene: Secondary | ICD-10-CM

## 2021-02-13 DIAGNOSIS — R768 Other specified abnormal immunological findings in serum: Secondary | ICD-10-CM | POA: Diagnosis not present

## 2021-02-13 DIAGNOSIS — E790 Hyperuricemia without signs of inflammatory arthritis and tophaceous disease: Secondary | ICD-10-CM

## 2021-02-13 DIAGNOSIS — M0579 Rheumatoid arthritis with rheumatoid factor of multiple sites without organ or systems involvement: Secondary | ICD-10-CM

## 2021-02-13 DIAGNOSIS — E559 Vitamin D deficiency, unspecified: Secondary | ICD-10-CM

## 2021-02-13 DIAGNOSIS — Z96651 Presence of right artificial knee joint: Secondary | ICD-10-CM | POA: Diagnosis not present

## 2021-02-13 DIAGNOSIS — I2510 Atherosclerotic heart disease of native coronary artery without angina pectoris: Secondary | ICD-10-CM

## 2021-02-13 DIAGNOSIS — Z79899 Other long term (current) drug therapy: Secondary | ICD-10-CM | POA: Diagnosis not present

## 2021-02-13 DIAGNOSIS — M1712 Unilateral primary osteoarthritis, left knee: Secondary | ICD-10-CM | POA: Diagnosis not present

## 2021-02-13 DIAGNOSIS — I428 Other cardiomyopathies: Secondary | ICD-10-CM

## 2021-02-13 DIAGNOSIS — R7881 Bacteremia: Secondary | ICD-10-CM

## 2021-02-13 DIAGNOSIS — M0630 Rheumatoid nodule, unspecified site: Secondary | ICD-10-CM | POA: Diagnosis not present

## 2021-02-13 DIAGNOSIS — Z7901 Long term (current) use of anticoagulants: Secondary | ICD-10-CM

## 2021-02-13 DIAGNOSIS — I10 Essential (primary) hypertension: Secondary | ICD-10-CM

## 2021-02-13 NOTE — Patient Instructions (Signed)
Standing Labs We placed an order today for your standing lab work.   Please have your standing labs drawn in April and every 3 months   CBC with diff and CMP with GFR  If possible, please have your labs drawn 2 weeks prior to your appointment so that the provider can discuss your results at your appointment.  We have open lab daily Monday through Thursday from 1:30-4:30 PM and Friday from 1:30-4:00 PM at the office of Dr. Bo Merino, [facility].   Please be advised, all patients with office appointments requiring lab work will take precedents over walk-in lab work.  If possible, please come for your lab work on Monday and Friday afternoons, as you may experience shorter wait times. The office is located at [address] [place], [place], [place] [address] No appointment is necessary.   Labs are drawn by [facility]. Please bring your co-pay at the time of your lab draw.  You may receive a bill from [facility] for your lab work.  If you wish to have your labs drawn at another location, please call the office 24 hours in advance to send orders.  If you have any questions regarding directions or hours of operation,  please call [phone].   As a reminder, please drink plenty of water prior to coming for your lab work. Thanks!

## 2021-02-15 ENCOUNTER — Other Ambulatory Visit (HOSPITAL_COMMUNITY): Payer: Self-pay | Admitting: *Deleted

## 2021-02-18 ENCOUNTER — Encounter: Payer: Medicare HMO | Admitting: Registered Nurse

## 2021-02-21 ENCOUNTER — Other Ambulatory Visit (HOSPITAL_COMMUNITY): Payer: Self-pay | Admitting: Cardiology

## 2021-02-21 DIAGNOSIS — N312 Flaccid neuropathic bladder, not elsewhere classified: Secondary | ICD-10-CM | POA: Diagnosis not present

## 2021-02-21 DIAGNOSIS — N3000 Acute cystitis without hematuria: Secondary | ICD-10-CM | POA: Diagnosis not present

## 2021-02-21 DIAGNOSIS — R338 Other retention of urine: Secondary | ICD-10-CM | POA: Diagnosis not present

## 2021-02-28 ENCOUNTER — Other Ambulatory Visit: Payer: Self-pay

## 2021-02-28 ENCOUNTER — Ambulatory Visit: Payer: Medicare HMO | Admitting: Cardiology

## 2021-02-28 ENCOUNTER — Encounter: Payer: Self-pay | Admitting: Cardiology

## 2021-02-28 VITALS — BP 122/60 | HR 100 | Ht 65.0 in | Wt 188.6 lb

## 2021-02-28 DIAGNOSIS — I4819 Other persistent atrial fibrillation: Secondary | ICD-10-CM

## 2021-02-28 NOTE — Patient Instructions (Addendum)
Medication Instructions:  Your physician recommends that you continue on your current medications as directed. Please refer to the Current Medication list given to you today.  Labwork: None ordered.  Testing/Procedures: None ordered.  Follow-Up: Your physician wants you to follow-up in: 08/22/21 at 2 pm with Allegra Lai, MD   Any Other Special Instructions Will Be Listed Below (If Applicable).  If you need a refill on your cardiac medications before your next appointment, please call your pharmacy.

## 2021-02-28 NOTE — Progress Notes (Signed)
Electrophysiology Office Note   Date:  02/28/2021   ID:  Patricia Wagner, DOB 10/01/1951, MRN [id number]  PCP:  Pcp, No  Cardiologist:  [place] Primary Electrophysiologist:  Enio Hornback Meredith Leeds, MD    No chief complaint on file.    History of Present Illness: Patricia Wagner is a 70 y.o. female who is being seen today for the evaluation of CHF, atrial fibrillation at the request of Maximiano Coss, NP. Presenting today for electrophysiology evaluation.   She has a history significant for persistent atrial fibrillation, nonischemic cardiomyopathy, hypertension, and hyperlipidemia.  She was diagnosed with atrial fibrillation December 2017.  She had multiple cardioversions and has been loaded on amiodarone.  She presented to the hospital June 2018 in cardiogenic shock in the setting of persistent atrial fibrillation.  EF was found to be 15%.  Amiodarone was again reloaded.  Since then she had done well.  She is now status post AF ablation 10/13/2019.  Amiodarone has since been stopped.  Today, denies symptoms of palpitations, chest pain, shortness of breath, orthopnea, PND, lower extremity edema, claudication, dizziness, presyncope, syncope, bleeding, or neurologic sequela. The patient is tolerating medications without difficulties.  Since last being seen she has done well.  She did have a knee replacement, though she continues to struggle with her left knee.  She states that she [name] not get this replaced that she had a small stroke around the time of her right knee replacement.  Aside from that, she has no major complaints and is doing well.   Past Medical History:  Diagnosis Date  . Acute blood loss anemia 03/05/2018  . Acute hypercapnic respiratory failure ([place]) 04/30/2017  . Acute renal failure superimposed on chronic kidney disease ([place]) 10/20/2016  . Arthritis    hands, knees  . Atrial fibrillation ([place])    a. s/p multiple cardioversions; failed tikosyn/sotalol.  . Bradycardia 11/20/2016   . CAD in native artery, s/p cardiac cath with non obstructive CAD 10/24/2016  . Cerebrovascular accident (CVA) due to embolism of left cerebellar artery ([place])   . CHF (congestive heart failure) ([place])   . Chronic anticoagulation 03/15/2018  . Chronic atrial fibrillation ([place]) 03/05/2018  . Chronic systolic (congestive) heart failure ([place]) 03/05/2018  . CKD (chronic kidney disease) stage 3, GFR 30-59 ml/min (HCC)   . Coagulopathy ([place]) 03/05/2018  . DIVERTICULITIS, HX OF 07/25/2007  . Diverticulosis of colon with hemorrhage 07/22/2007   Qualifier: Diagnosis of  By: [name]    . DIVERTICULOSIS, COLON 07/22/2007  . Dysuria 07/21/2017  . Edema, peripheral    a. chronic BLE edema, R>L. Prior trauma from dog attack and accident.  . Essential hypertension 07/22/2007   Qualifier: Diagnosis of  By: [name]    . History of kidney stones   . HLD (hyperlipidemia) 02/03/2008   Qualifier: Diagnosis of  By: [name], [name]   . HYPERLIPIDEMIA 02/03/2008  . Hypersomnia    declines w/u  . Hypertension   . Hypotension (arterial) 04/30/2017  . MENOPAUSAL DISORDER 01/09/2011  . Morbid obesity ([name]) 07/22/2007  . NICM (nonischemic cardiomyopathy) ([place]) 10/24/2016  . PAF (paroxysmal atrial fibrillation) ([place])   . Presence of indwelling urinary catheter    supra pubic cath  . Raynaud's syndrome 07/22/2007  . Stroke ([place]) 2017  . Suspected sleep apnea 05/20/2017  . THYROID NODULE, RIGHT 01/04/2010  . VITAMIN D DEFICIENCY 01/09/2011   Qualifier: Diagnosis of  By: [name], [name]    Past Surgical History:  Procedure Laterality Date  . ATRIAL FIBRILLATION ABLATION N/A 10/13/2019   Procedure: ATRIAL FIBRILLATION ABLATION;  Surgeon: [name], MD;  Location: [facility];  Service: Cardiovascular;  Laterality: N/A;  . CARDIAC CATHETERIZATION N/A 10/23/2016   Procedure: Left Heart Cath and Coronary Angiography;  Surgeon: [name], MD;  Location: [facility];  Service:  Cardiovascular;  Laterality: N/A;  . CARDIOVERSION N/A 10/31/2016   Procedure: CARDIOVERSION;  Surgeon: [name], MD;  Location: [facility];  Service: Cardiovascular;  Laterality: N/A;  . CARDIOVERSION N/A 11/03/2016   Procedure: CARDIOVERSION;  Surgeon: [name], MD;  Location: [facility];  Service: Cardiovascular;  Laterality: N/A;  . CARDIOVERSION N/A 11/18/2016   Procedure: CARDIOVERSION;  Surgeon: [name], MD;  Location: [facility];  Service: Cardiovascular;  Laterality: N/A;  . CARDIOVERSION N/A 03/25/2017   Procedure: CARDIOVERSION;  Surgeon: [name], MD;  Location: [facility] ENDOSCOPY;  Service: Cardiovascular;  Laterality: N/A;  . CARDIOVERSION N/A 05/06/2017   Procedure: CARDIOVERSION;  Surgeon: [name], MD;  Location: [facility] ENDOSCOPY;  Service: Cardiovascular;  Laterality: N/A;  . CARDIOVERSION N/A 05/12/2017   Procedure: CARDIOVERSION;  Surgeon: [name], MD;  Location: [facility] ENDOSCOPY;  Service: Cardiovascular;  Laterality: N/A;  . COLONOSCOPY N/A 12/04/2017   Procedure: COLONOSCOPY;  Surgeon: [name], MD;  Location: [facility];  Service: Endoscopy;  Laterality: N/A;  . COLONOSCOPY    . COLONOSCOPY W/ POLYPECTOMY  02/2011   pan diverticulosis.  tubular adenoma without dysplasia on 5 mm sigmoid polyp.  Dr Fuller Plan.    . ESOPHAGOGASTRODUODENOSCOPY (EGD) WITH PROPOFOL N/A 05/16/2020   Procedure: ESOPHAGOGASTRODUODENOSCOPY (EGD) WITH PROPOFOL;  Surgeon: Milus Banister, MD;  Location: [facility] ENDOSCOPY;  Service: Endoscopy;  Laterality: N/A;  . FLEXIBLE SIGMOIDOSCOPY N/A 01/18/2020   Procedure: FLEXIBLE SIGMOIDOSCOPY;  Surgeon: [name], DO;  Location: [facility];  Service: Gastroenterology;  Laterality: N/A;  . HEMOSTASIS CLIP PLACEMENT  01/18/2020   Procedure: HEMOSTASIS CLIP PLACEMENT;  Surgeon: [name], DO;  Location: [facility];  Service: Gastroenterology;;  . HOT HEMOSTASIS N/A 05/16/2020   Procedure: HOT HEMOSTASIS  (ARGON PLASMA COAGULATION/BICAP);  Surgeon: Milus Banister, MD;  Location: [facility];  Service: Endoscopy;  Laterality: N/A;  . PARTIAL HYSTERECTOMY     1 OVARY LEFT  . SCLEROTHERAPY  01/18/2020   Procedure: SCLEROTHERAPY;  Surgeon: [name], DO;  Location: [facility] ENDOSCOPY;  Service: Gastroenterology;;  . TEE WITHOUT CARDIOVERSION N/A 10/31/2016   Procedure: TRANSESOPHAGEAL ECHOCARDIOGRAM (TEE);  Surgeon: [name], MD;  Location: [facility] ENDOSCOPY;  Service: Cardiovascular;  Laterality: N/A;  . TEE WITHOUT CARDIOVERSION N/A 03/25/2017   Procedure: TRANSESOPHAGEAL ECHOCARDIOGRAM (TEE);  Surgeon: [name], MD;  Location: [facility] ENDOSCOPY;  Service: Cardiovascular;  Laterality: N/A;  . TOTAL KNEE ARTHROPLASTY Right 04/10/2020   Procedure: TOTAL KNEE ARTHROPLASTY;  Surgeon: Renette Butters, MD;  Location: [facility];  Service: Orthopedics;  Laterality: Right;     Current Outpatient Medications  Medication Sig Dispense Refill  . Acetaminophen (TYLENOL PO) Take by mouth as needed.     . Cholecalciferol (VITAMIN D3) 25 MCG (1000 UT) CAPS Take 1,000 Units by mouth daily.    . empagliflozin (JARDIANCE) 10 MG TABS tablet Take 1 tablet (10 mg total) by mouth daily before breakfast. 30 tablet 6  . furosemide (LASIX) 80 MG tablet Take 1 tablet (80 mg total) by mouth daily. 30 tablet 11  . leflunomide (ARAVA) 20 MG tablet TAKE  1 TABLET BY MOUTH EVERY DAY 30 tablet 2  . pantoprazole (PROTONIX) 40 MG tablet TAKE 1 TABLET BY MOUTH EVERY DAY 30 tablet 0  . Potassium Chloride ER 20 MEQ TBCR Take 1 tablet by mouth daily.    . predniSONE (DELTASONE) 5 MG tablet TAKE 1 TABLET BY MOUTH EVERY DAY WITH BREAKFAST 90 tablet 0  . rosuvastatin (CRESTOR) 10 MG tablet TAKE 1 TABLET BY MOUTH EVERY DAY 90 tablet 3   No current facility-administered medications for this visit.    Allergies:   Ace inhibitors   Social History:  The patient  reports that she has quit smoking. Her smoking use included cigarettes.  She started smoking about 3 years ago. She smoked 0.50 packs per day. She has never used smokeless tobacco. She reports previous alcohol use. She reports that she does not use drugs.   Family History:  The patient's family history includes Asthma in her mother; Diabetes in her father; Healthy in her daughter; Heart disease in her father and sister; Lung disease in her sister.   ROS:  Please see the history of present illness.   Otherwise, review of systems is positive for none.   All other systems are reviewed and negative.   PHYSICAL EXAM: VS:  BP 122/60   Pulse 100   Ht 5\' 5"  (3.825 m)   Wt 188 lb 9.6 oz (85.5 kg)   SpO2 90%   BMI 31.38 kg/m  , BMI Body mass index is 31.38 kg/m. GEN: Well nourished, well developed, in no acute distress  HEENT: normal  Neck: no JVD, carotid bruits, or masses Cardiac: RRR; no murmurs, rubs, or gallops,no edema  Respiratory:  clear to auscultation bilaterally, normal work of breathing GI: soft, nontender, nondistended, + BS MS: no deformity or atrophy  Skin: warm and dry Neuro:  Strength and sensation are intact Psych: euthymic mood, full affect  EKG:  EKG is not ordered today. Personal review of the ekg ordered 01/18/21 shows sinus rhythm, PVCs, rate 86  Recent Labs: 03/05/2020: TSH 0.291 05/16/2020: Magnesium 2.1 02/01/2021: ALT 7; BUN 25; Creat 1.37; Hemoglobin 11.9; Platelets 211; Potassium 4.3; Sodium 137    Lipid Panel     Component Value Date/Time   CHOL 136 01/24/2020 1216   TRIG 71 01/24/2020 1216   HDL 71 01/24/2020 1216   CHOLHDL 1.9 01/24/2020 1216   CHOLHDL 2 04/15/2019 1549   VLDL 21.4 04/15/2019 1549   LDLCALC 51 01/24/2020 1216   LDLDIRECT 121.6 03/15/2012 1646     Wt Readings from Last 3 Encounters:  02/28/21 188 lb 9.6 oz (85.5 kg)  02/13/21 188 lb (85.3 kg)  01/18/21 183 lb 9.6 oz (83.3 kg)      Other studies Reviewed: Additional studies/ records that were reviewed today include: TTE 07/19/19  Review of the above  records today demonstrates:   1. The left ventricle has mildly reduced systolic function, with an ejection fraction of 45-50%. The cavity size was normal. There is severe concentric left ventricular hypertrophy. Left ventricular diastolic Doppler parameters are consistent with  impaired relaxation.  2. The right ventricle has normal systolic function. The cavity was normal. There is no increase in right ventricular wall thickness.  3. Left atrial size was mildly dilated.  4. The aortic valve is tricuspid. Mild calcification of the aortic valve. No aortic stenosis.  LHC 10/23/16 1. Mild, non-obstructive coronary artery disease consistent with non-ischemic cardiomyopathy. 2. Mildly elevated left ventricular filling pressure.   ASSESSMENT AND PLAN:  1.  Persistent atrial fibrillation: Currently on Eliquis with a CHA2DS2-VASc of 3.  Status post ablation 10/13/2019.  Amiodarone was stopped at her last visit.  She is also been taken off of her Eliquis due to recurrent GI bleeding.  She remains in sinus rhythm.  No changes.  2.  Chronic systolic heart failure due to nonischemic cardiomyopathy: Potentially tachycardia mediated due to her atrial fibrillation.  Echo shows an ejection fraction of 40 to 45%.  Plan per primary cardiology.     Current medicines are reviewed at length with the patient today.   The patient does not have concerns regarding her medicines.  The following changes were made today: None  Labs/ tests ordered today include:  No orders of the defined types were placed in this encounter.    Disposition:   FU with Ayahna Solazzo 6 months  Signed, Goro Wenrick Meredith Leeds, MD  02/28/2021 10:27 AM     [facility] [address] [place] [address] [phone] (office) [phone] (fax)

## 2021-03-11 DIAGNOSIS — R338 Other retention of urine: Secondary | ICD-10-CM | POA: Diagnosis not present

## 2021-03-16 ENCOUNTER — Other Ambulatory Visit: Payer: Self-pay | Admitting: Physician Assistant

## 2021-03-17 NOTE — Telephone Encounter (Signed)
Next Visit: 06/13/2021  Last Visit: 02/13/2021  Last Fill: 01/17/2021,   DX: Rheumatoid arthritis involving multiple sites with positive rheumatoid factor   Current Dose per office note 02/13/2021, Arava 20 mg 1 tablet by mouth daily   Labs: 02/01/2021, GFR is low and stable. Most likely due to the use of Lasix. Lymphocyte count is low due to immunosuppressive therapy.  Okay to refill Arava?

## 2021-03-30 ENCOUNTER — Other Ambulatory Visit: Payer: Self-pay | Admitting: Physician Assistant

## 2021-04-04 ENCOUNTER — Other Ambulatory Visit: Payer: Self-pay

## 2021-04-04 NOTE — Patient Outreach (Signed)
[place] [facility] Care Management  04/04/2021  Patricia Wagner [birth year] [id number]   Telephone Screen  Referral Date: 04/03/2021 Referral Source: High Risk Report Insurance: [facility]   Outreach attempt # 1 to patient. No answer. RN CM left HIPAA compliant voicemail message along with contact info.     Plan: RN CM will make outreach attempt to patient within 3-4 business days. RN CM will send unsuccessful outreach letter to patient.   Enzo Montgomery, RN,BSN,CCM [place] Management Telephonic Care Management Coordinator Direct Phone: [phone] Toll Free: [phone] Fax: [phone]

## 2021-04-08 ENCOUNTER — Telehealth (HOSPITAL_COMMUNITY): Payer: Self-pay | Admitting: *Deleted

## 2021-04-08 ENCOUNTER — Other Ambulatory Visit: Payer: Self-pay

## 2021-04-08 NOTE — Telephone Encounter (Signed)
Pt left vm stating she needed meds sent to a different pharmacy. Called to get more information no answer/left vm for pt to return my call.

## 2021-04-08 NOTE — Patient Outreach (Signed)
[place] [facility]) Care Management  04/08/2021  Patricia Wagner 1950/12/14 [id number]    Telephone Screen  Referral Date: 04/03/2021 Referral Source: High Risk Report Insurance: [facility]   Outreach attempt # 2 to patient. No answer. RN CM left HIPAA compliant voicemail message along with contact info.      Plan: RN CM will make outreach attempt to patient within 3-4 business days.  Enzo Montgomery, RN,BSN,CCM [place] Management Telephonic Care Management Coordinator Direct Phone: [phone] Toll Free: [phone] Fax: [phone]

## 2021-04-09 ENCOUNTER — Other Ambulatory Visit: Payer: Self-pay

## 2021-04-09 NOTE — Patient Outreach (Signed)
[place] [facility]) Care Management  04/09/2021  Patricia Wagner 05/26/1951 [id number]   Telephone Screen  Referral Date: 04/03/2021 Referral Source: High Risk Report Insurance: [facility]  Initial Assessment   Incoming call from patient returning RN CM call. RN CM discussed and reviewed [facility] services. Patient gave verbal consent for services. Patient endorses that she did not stay with PCP(M[facility] ) when he moved locations. She has selected new PCP-Dr. Ronnald Ramp with [facility]. She does not go for her initial visit until next month.   Social: Patient resides in her apartment alone. She reports she has supportive daughter who checks on her as needed. She is independent with ADLs/IADLs. She drives herself to medial appts. She denies any recent falls. She has cane and walker that she uses prn.  Conditions: Per chart review, patient has PMH that includes but not limited to CHF, A-fib, HTN,former smoker, HLD, CVA, CKD, nonischemic cardiomyopathy and chronic pain.   Medications Reviewed Today    Reviewed by Hayden Pedro, RN (Registered Nurse) on 04/09/21 at 1201  Med List Status: <None>  Medication Order Taking? Sig Documenting Provider Last Dose Status Informant  Acetaminophen (TYLENOL PO) [id number] No Take by mouth as needed.  [provider] Taking Active   Cholecalciferol (VITAMIN D3) 25 MCG (1000 UT) CAPS [id number] No Take 1,000 Units by mouth daily. [provider] Taking Active Self  empagliflozin (JARDIANCE) 10 MG TABS tablet [id number] No Take 1 tablet (10 mg total) by mouth daily before breakfast. Bensimhon, Shaune Pascal, MD Taking Active   furosemide (LASIX) 80 MG tablet [id number] No Take 1 tablet (80 mg total) by mouth daily. Bensimhon, Shaune Pascal, MD Taking Active   leflunomide (ARAVA) 20 MG tablet [id number]  TAKE 1 TABLET BY MOUTH EVERY DAY Bo Merino, MD  Active   pantoprazole (PROTONIX) 40 MG tablet [id number] No TAKE 1 TABLET BY MOUTH  EVERY DAY Camnitz, Will Hassell Done, MD Taking Active   Potassium Chloride ER 20 MEQ TBCR [id number] No Take 1 tablet by mouth daily. [provider] Taking Active   predniSONE (DELTASONE) 5 MG tablet [id number] No TAKE 1 TABLET BY MOUTH EVERY DAY WITH BREAKFAST Ofilia Neas, PA-C Taking Active   rosuvastatin (CRESTOR) 10 MG tablet [id number] No TAKE 1 TABLET BY MOUTH EVERY DAY Biagio Borg, MD Taking Active           Depression screen [facility] 2/9 04/09/2021 05/29/2020 03/05/2020 02/06/2020 01/24/2020  Decreased Interest 0 0 0 0 0  Down, Depressed, Hopeless 0 0 0 0 0  PHQ - 2 Score 0 0 0 0 0  Altered sleeping - - - - -  Tired, decreased energy - - - - -  Change in appetite - - - - -  Feeling bad or failure about yourself  - - - - -  Trouble concentrating - - - - -  Moving slowly or fidgety/restless - - - - -  Suicidal thoughts - - - - -  PHQ-9 Score - - - - -  Difficult doing work/chores - - - - -  Some recent data might be hidden   Fall Risk  04/09/2021 05/29/2020 03/05/2020 02/06/2020 01/24/2020  Falls in the past year? 0 0 0 0 0  Number falls in past yr: 0 0 0 0 0  Injury with Fall? 0 0 0 0 0  Risk for fall due to : Medication side effect - - - -  Follow up Education provided;Falls evaluation completed Falls evaluation completed Falls  evaluation completed Falls evaluation completed Falls evaluation completed   SDOH Screenings   Alcohol Screen: Not on file  Depression (PHQ2-9): Low Risk   . PHQ-2 Score: 0  Financial Resource Strain: Not on file  Food Insecurity: No Food Insecurity  . Worried About Charity fundraiser in the Last Year: Never true  . Ran Out of Food in the Last Year: Never true  Housing: Not on file  Physical Activity: Not on file  Social Connections: Not on file  Stress: Not on file  Tobacco Use: Medium Risk  . Smoking Tobacco Use: Former Smoker  . Smokeless Tobacco Use: Never Used  Transportation Needs: No Transportation Needs  . Lack of Transportation (Medical):  No  . Lack of Transportation (Non-Medical): No    Goals Addressed              This Visit's Progress   .  ([facility])Track and Manage Fluids and Swelling-Heart Failure (pt-stated)        Timeframe:  Long-Range Goal Priority:  High Start Date: 04/09/21                            Expected End Date:  07/23/2021                     Follow Up Date Aug2022  Barriers: Health Behaviors Knowledge    - call office if I gain more than 2 pounds in one day or 5 pounds in one week - keep legs up while sitting - track weight in diary - use salt in moderation - watch for swelling in feet, ankles and legs every day - weigh myself daily    Why is this important?    It is important to check your weight daily and watch how much salt and liquids you have.   It will help you to manage your heart failure.    Notes:   04/09/21-Patient reports intermittent periods of swelling-states swelling managed at present. She voices adherence to diuretic regimen and diet restrictions. She is aware of when to seek medical attention for changes in wgt.    .  (THN)Track and Manage Symptoms-Heart Failure (pt-stated)        Timeframe:  Long-Range Goal Priority:  High Start Date: 04/09/21                            Expected End Date: 07/23/2021                      Follow Up Date Aug 2022  Barriers: Health Behaviors Knowledge    - develop a rescue plan - follow rescue plan if symptoms flare-up - know when to call the doctor - track symptoms and what helps feel better or worse    Why is this important?    You will be able to handle your symptoms better if you keep track of them.   Making some simple changes to your lifestyle will help.   Eating healthy is one thing you can do to take good care of yourself.    Notes:  04/09/21-Patient admits that she does not weigh daily but unable to provide a reason She reports intermittent periods of swelling. She has already called cardiology office today and left  message regarding possible adjustment to meds for fluid retention.        Plan: RN CM discussed  with patient next outreach within the month of  Aug. Patient gave verbal consent and in agreement with RN CM follow up and timeframe. Patient aware that they may contact RN CM sooner for any issues or concerns. RN CM reviewed goals and plan of care with patient. Patient agrees to care plan and follow up. RN CM will send barriers letter and route encounter to PCP. RN CM will send welcome letter to patient.   Enzo Montgomery, RN,BSN,CCM [place] Management Telephonic Care Management Coordinator Direct Phone: [phone] Toll Free: [phone] Fax: [phone]

## 2021-04-10 ENCOUNTER — Telehealth (HOSPITAL_COMMUNITY): Payer: Self-pay | Admitting: *Deleted

## 2021-04-10 DIAGNOSIS — N3000 Acute cystitis without hematuria: Secondary | ICD-10-CM | POA: Diagnosis not present

## 2021-04-10 NOTE — Telephone Encounter (Signed)
Pt left vm requesting return call about changing diuretic. I called pt back to get more information no answer/left vm for return call

## 2021-04-11 ENCOUNTER — Ambulatory Visit: Payer: Self-pay

## 2021-04-11 MED ORDER — POTASSIUM CHLORIDE ER 20 MEQ PO TBCR: 1.0000 | EXTENDED_RELEASE_TABLET | Freq: Every day | ORAL | 3 refills | Status: DC

## 2021-04-11 MED ORDER — FUROSEMIDE 80 MG PO TABS: 80.0000 mg | ORAL_TABLET | Freq: Every day | ORAL | 11 refills | Status: DC

## 2021-04-11 NOTE — Addendum Note (Signed)
Addended by: Harvie Junior on: 04/11/2021 01:17 PM   Modules accepted: Orders

## 2021-04-11 NOTE — Telephone Encounter (Signed)
Pt returned call and said she had been taking lasix 40mg  daily and it isnt enough. I advised pt that she is supposed to take lasix  80mg  daily per Dr.Bensimhons last office note. Pt thanked me for the call and said she would call back if 80mg  did not work.

## 2021-04-14 ENCOUNTER — Other Ambulatory Visit: Payer: Self-pay | Admitting: Rheumatology

## 2021-04-15 ENCOUNTER — Telehealth: Payer: Self-pay

## 2021-04-15 DIAGNOSIS — Z79899 Other long term (current) drug therapy: Secondary | ICD-10-CM

## 2021-04-15 NOTE — Telephone Encounter (Signed)
I called [facility] and confirmed patient has active refills of arava. I advised patient and she verbalized understanding. I advised patient she is due for labs on or around 05/04/2021 but patient states she wants to go this week. I have released orders for [facility].

## 2021-04-15 NOTE — Telephone Encounter (Signed)
Patient called requesting labwork orders be sent to [facility] [address].  Patient states she received a call from [facility] telling her she needed to contact Dr. Estanislado Pandy regarding her Leflunomide prescription.

## 2021-04-17 ENCOUNTER — Other Ambulatory Visit: Payer: Self-pay | Admitting: Physician Assistant

## 2021-04-17 DIAGNOSIS — Z79899 Other long term (current) drug therapy: Secondary | ICD-10-CM | POA: Diagnosis not present

## 2021-04-17 NOTE — Telephone Encounter (Signed)
Next Visit: 06/13/2021  Last Visit: 02/13/2021  Last Fill: 02/06/2021  Dx: Rheumatoid arthritis involving multiple sites with positive rheumatoid factor   Current Dose per office note on 02/13/2021, prednisone 5 mg 1 tablet by mouth daily  Okay to refill prednisone?

## 2021-04-18 LAB — CMP14+EGFR
ALT: 6 IU/L (ref 0–32)
AST: 9 IU/L (ref 0–40)
Albumin/Globulin Ratio: 1.4 (ref 1.2–2.2)
Albumin: 4.1 g/dL (ref 3.8–4.8)
Alkaline Phosphatase: 84 IU/L (ref 44–121)
BUN/Creatinine Ratio: 21 (ref 12–28)
BUN: 34 mg/dL — ABNORMAL HIGH (ref 8–27)
Bilirubin Total: 0.3 mg/dL (ref 0.0–1.2)
CO2: 29 mmol/L (ref 20–29)
Calcium: 9 mg/dL (ref 8.7–10.3)
Chloride: 99 mmol/L (ref 96–106)
Creatinine, Ser: 1.63 mg/dL — ABNORMAL HIGH (ref 0.57–1.00)
Globulin, Total: 2.9 g/dL (ref 1.5–4.5)
Glucose: 93 mg/dL (ref 65–99)
Potassium: 4.4 mmol/L (ref 3.5–5.2)
Sodium: 139 mmol/L (ref 134–144)
Total Protein: 7 g/dL (ref 6.0–8.5)
eGFR: 34 mL/min/{1.73_m2} — ABNORMAL LOW (ref >59)

## 2021-04-18 LAB — CBC WITH DIFFERENTIAL/PLATELET
Basophils Absolute: 0 10*3/uL (ref 0.0–0.2)
Basos: 0 %
EOS (ABSOLUTE): 0 10*3/uL (ref 0.0–0.4)
Eos: 0 %
Hematocrit: 39.6 % (ref 34.0–46.6)
Hemoglobin: 12.5 g/dL (ref 11.1–15.9)
Immature Grans (Abs): 0 10*3/uL (ref 0.0–0.1)
Immature Granulocytes: 0 %
Lymphocytes Absolute: 0.8 10*3/uL (ref 0.7–3.1)
Lymphs: 11 %
MCH: 27.8 pg (ref 26.6–33.0)
MCHC: 31.6 g/dL (ref 31.5–35.7)
MCV: 88 fL (ref 79–97)
Monocytes Absolute: 0.5 10*3/uL (ref 0.1–0.9)
Monocytes: 8 %
Neutrophils Absolute: 5.3 10*3/uL (ref 1.4–7.0)
Neutrophils: 81 %
Platelets: 202 10*3/uL (ref 150–450)
RBC: 4.49 x10E6/uL (ref 3.77–5.28)
RDW: 13.1 % (ref 11.7–15.4)
WBC: 6.7 10*3/uL (ref 3.4–10.8)

## 2021-04-18 NOTE — Telephone Encounter (Signed)
CBC is normal.  Creatinine is elevated, most likely due to the use of diuretics.

## 2021-04-23 ENCOUNTER — Telehealth (HOSPITAL_COMMUNITY): Payer: Self-pay | Admitting: *Deleted

## 2021-04-23 ENCOUNTER — Other Ambulatory Visit (HOSPITAL_COMMUNITY): Payer: Self-pay | Admitting: *Deleted

## 2021-04-23 NOTE — Telephone Encounter (Signed)
Pt called c/o swelling in her foot. Pt said she believes it is fluid. Pt denies weight gain, shortness of breath, fatigue, dizziness, or chest pain. pts states she is taking lasix 80mg  daily but thinks she needs to increase medication.   Routed to [facility] for advice

## 2021-04-24 DIAGNOSIS — N183 Chronic kidney disease, stage 3 unspecified: Secondary | ICD-10-CM | POA: Diagnosis not present

## 2021-04-24 DIAGNOSIS — Z720 Tobacco use: Secondary | ICD-10-CM | POA: Diagnosis not present

## 2021-04-24 DIAGNOSIS — I428 Other cardiomyopathies: Secondary | ICD-10-CM | POA: Diagnosis not present

## 2021-04-24 DIAGNOSIS — I509 Heart failure, unspecified: Secondary | ICD-10-CM | POA: Diagnosis not present

## 2021-04-24 DIAGNOSIS — I4891 Unspecified atrial fibrillation: Secondary | ICD-10-CM | POA: Diagnosis not present

## 2021-04-24 DIAGNOSIS — N2581 Secondary hyperparathyroidism of renal origin: Secondary | ICD-10-CM | POA: Diagnosis not present

## 2021-04-24 DIAGNOSIS — I129 Hypertensive chronic kidney disease with stage 1 through stage 4 chronic kidney disease, or unspecified chronic kidney disease: Secondary | ICD-10-CM | POA: Diagnosis not present

## 2021-04-24 DIAGNOSIS — D631 Anemia in chronic kidney disease: Secondary | ICD-10-CM | POA: Diagnosis not present

## 2021-04-24 NOTE — Telephone Encounter (Signed)
Pt returned call and is aware and agreeable with plan.

## 2021-04-24 NOTE — Telephone Encounter (Signed)
No answer left vm for pt to return call.

## 2021-04-25 ENCOUNTER — Other Ambulatory Visit: Payer: Self-pay | Admitting: Internal Medicine

## 2021-04-25 NOTE — Telephone Encounter (Signed)
Please refill as per office routine med refill policy (all routine meds refilled for 3 mo or monthly per pt preference up to one year from last visit, then month to month grace period for 3 mo, then further med refills will have to be denied)  

## 2021-04-26 ENCOUNTER — Telehealth: Payer: Self-pay | Admitting: Rheumatology

## 2021-04-26 NOTE — Telephone Encounter (Signed)
Patient calling to request to have Visco series of her left knee again. Okay to apply for injections? Please advise.

## 2021-04-26 NOTE — Telephone Encounter (Signed)
Okay to apply for left knee visco, per Hazel Sams, PA-C. Thanks!

## 2021-05-02 NOTE — Telephone Encounter (Signed)
Submitted for VOB. Submitted for PA thru [employer]. 05/02/2021

## 2021-05-03 DIAGNOSIS — R338 Other retention of urine: Secondary | ICD-10-CM | POA: Diagnosis not present

## 2021-05-10 NOTE — Telephone Encounter (Signed)
Please call patient to schedule Visco knee injections.  Authorized for Orthovisc series left knee. Buy and Bill. Deductible does not apply. PA for Orthovisc thru [employer]. Authorization # [id number] Effective: 05/02/2021 - 11/22/2021 Insurance to cover 80% of allowable cost for administration and product.  $20.00 copay for each visit.

## 2021-05-15 ENCOUNTER — Other Ambulatory Visit: Payer: Self-pay | Admitting: Rheumatology

## 2021-05-15 NOTE — Telephone Encounter (Signed)
Next Visit: 06/13/2021   Last Visit: 02/13/2021   Last Fill: 01/17/2021,    DX: Rheumatoid arthritis involving multiple sites with positive rheumatoid factor    Current Dose per office note 02/13/2021, Arava 20 mg 1 tablet by mouth daily    Labs: 04/17/2021 CBC is normal.  Creatinine is elevated, most likely due to the use of diuretics.   Okay to refill Arava?

## 2021-05-16 ENCOUNTER — Other Ambulatory Visit: Payer: Self-pay | Admitting: Rheumatology

## 2021-05-16 ENCOUNTER — Other Ambulatory Visit (HOSPITAL_COMMUNITY): Payer: Self-pay | Admitting: Cardiology

## 2021-05-16 NOTE — Telephone Encounter (Signed)
Should discuss w/ PCP if should remain on medication, and refills should go though PCP if so

## 2021-05-16 NOTE — Telephone Encounter (Signed)
Pt's pharmacy is requesting a refill on pantoprazole. Would Dr. Curt Bears like to refill this medication? Please address

## 2021-05-22 ENCOUNTER — Ambulatory Visit (INDEPENDENT_AMBULATORY_CARE_PROVIDER_SITE_OTHER): Payer: Medicare HMO | Admitting: Internal Medicine

## 2021-05-22 ENCOUNTER — Other Ambulatory Visit: Payer: Self-pay

## 2021-05-22 ENCOUNTER — Encounter: Payer: Self-pay | Admitting: Internal Medicine

## 2021-05-22 VITALS — BP 126/78 | HR 83 | Temp 98.8°F | Ht 65.0 in | Wt 202.0 lb

## 2021-05-22 DIAGNOSIS — E559 Vitamin D deficiency, unspecified: Secondary | ICD-10-CM

## 2021-05-22 DIAGNOSIS — Z0001 Encounter for general adult medical examination with abnormal findings: Secondary | ICD-10-CM | POA: Diagnosis not present

## 2021-05-22 DIAGNOSIS — E785 Hyperlipidemia, unspecified: Secondary | ICD-10-CM | POA: Diagnosis not present

## 2021-05-22 DIAGNOSIS — I10 Essential (primary) hypertension: Secondary | ICD-10-CM

## 2021-05-22 DIAGNOSIS — F1920 Other psychoactive substance dependence, uncomplicated: Secondary | ICD-10-CM

## 2021-05-22 DIAGNOSIS — R7989 Other specified abnormal findings of blood chemistry: Secondary | ICD-10-CM

## 2021-05-22 DIAGNOSIS — N1832 Chronic kidney disease, stage 3b: Secondary | ICD-10-CM

## 2021-05-22 DIAGNOSIS — E2839 Other primary ovarian failure: Secondary | ICD-10-CM | POA: Diagnosis not present

## 2021-05-22 DIAGNOSIS — I2510 Atherosclerotic heart disease of native coronary artery without angina pectoris: Secondary | ICD-10-CM | POA: Diagnosis not present

## 2021-05-22 DIAGNOSIS — Z1231 Encounter for screening mammogram for malignant neoplasm of breast: Secondary | ICD-10-CM

## 2021-05-22 LAB — LIPID PANEL
Cholesterol: 171 mg/dL (ref 0–200)
HDL: 69.7 mg/dL (ref >39.00)
LDL Cholesterol: 75 mg/dL (ref 0–99)
NonHDL: 101
Total CHOL/HDL Ratio: 2
Triglycerides: 128 mg/dL (ref 0.0–149.0)
VLDL: 25.6 mg/dL (ref 0.0–40.0)

## 2021-05-22 LAB — VITAMIN D 25 HYDROXY (VIT D DEFICIENCY, FRACTURES): VITD: 44.63 ng/mL (ref 30.00–100.00)

## 2021-05-22 LAB — BASIC METABOLIC PANEL
BUN: 27 mg/dL — ABNORMAL HIGH (ref 6–23)
CO2: 35 mEq/L — ABNORMAL HIGH (ref 19–32)
Calcium: 9.6 mg/dL (ref 8.4–10.5)
Chloride: 94 mEq/L — ABNORMAL LOW (ref 96–112)
Creatinine, Ser: 1.45 mg/dL — ABNORMAL HIGH (ref 0.40–1.20)
GFR: 37.24 mL/min — ABNORMAL LOW (ref >60.00)
Glucose, Bld: 90 mg/dL (ref 70–99)
Potassium: 3.9 mEq/L (ref 3.5–5.1)
Sodium: 137 mEq/L (ref 135–145)

## 2021-05-22 NOTE — Progress Notes (Signed)
Z12.31   Subjective:  Patient ID: Patricia Wagner, female    DOB: 1951-09-15  Age: 70 y.o. MRN: [id number]  CC: Annual Exam and Atrial Fibrillation  This visit occurred during the SARS-CoV-2 public health emergency.  Safety protocols were in place, including screening questions prior to the visit, additional usage of staff PPE, and extensive cleaning of exam room while observing appropriate contact time as indicated for disinfecting solutions.    HPI [name] presents for a CPX and f/up -   She had a suppressed TSH level 2 months ago.  She complains of weight gain.  She denies constipation, sleep disturbance, edema, chest pain, or shortness of breath.  History Celie has a past medical history of Acute blood loss anemia (03/05/2018), Acute hypercapnic respiratory failure ([place]) (04/30/2017), Acute renal failure superimposed on chronic kidney disease ([place]) (10/20/2016), Arthritis, Atrial fibrillation ([place]), Bradycardia (11/20/2016), CAD in native artery, s/p cardiac cath with non obstructive CAD (10/24/2016), Cerebrovascular accident (CVA) due to embolism of left cerebellar artery ([place]), CHF (congestive heart failure) ([place]), Chronic anticoagulation (03/15/2018), Chronic atrial fibrillation ([place]) (3/50/0938), Chronic systolic (congestive) heart failure ([place]) (03/05/2018), CKD (chronic kidney disease) stage 3, GFR 30-59 ml/min ([place]), Coagulopathy ([place]) (03/05/2018), DIVERTICULITIS, HX OF (07/25/2007), Diverticulosis of colon with hemorrhage (07/22/2007), DIVERTICULOSIS, COLON (07/22/2007), Dysuria (07/21/2017), Edema, peripheral, Essential hypertension (07/22/2007), History of kidney stones, HLD (hyperlipidemia) (02/03/2008), HYPERLIPIDEMIA (02/03/2008), Hypersomnia, Hypertension, Hypotension (arterial) (04/30/2017), MENOPAUSAL DISORDER (01/09/2011), Morbid obesity ([place]) (07/22/2007), NICM (nonischemic cardiomyopathy) ([place]) (10/24/2016), PAF (paroxysmal atrial fibrillation) ([place]), Presence of indwelling urinary catheter,  Raynaud's syndrome (07/22/2007), Stroke ([place]) (2017), Suspected sleep apnea (05/20/2017), THYROID NODULE, RIGHT (01/04/2010), and VITAMIN D DEFICIENCY (01/09/2011).   She has a past surgical history that includes Partial hysterectomy; Cardiac catheterization (N/A, 10/23/2016); Cardioversion (N/A, 10/31/2016); TEE without cardioversion (N/A, 10/31/2016); Cardioversion (N/A, 11/03/2016); Cardioversion (N/A, 11/18/2016); Cardioversion (N/A, 03/25/2017); TEE without cardioversion (N/A, 03/25/2017); Cardioversion (N/A, 05/06/2017); Cardioversion (N/A, 05/12/2017); Colonoscopy w/ polypectomy (02/2011); Colonoscopy (N/A, 12/04/2017); ATRIAL FIBRILLATION ABLATION (N/A, 10/13/2019); Flexible sigmoidoscopy (N/A, 01/18/2020); Hemostasis clip placement (01/18/2020); Sclerotherapy (01/18/2020); Colonoscopy; Total knee arthroplasty (Right, 04/10/2020); Esophagogastroduodenoscopy (egd) with propofol (N/A, 05/16/2020); and Hot hemostasis (N/A, 05/16/2020).   Her family history includes Asthma in her mother; Diabetes in her father; Healthy in her daughter; Heart disease in her father and sister; Lung disease in her sister.She reports that she has quit smoking. Her smoking use included cigarettes. She started smoking about 3 years ago. She smoked an average of 0.50 packs per day. She has never used smokeless tobacco. She reports previous alcohol use. She reports that she does not use drugs.  Outpatient Medications Prior to Visit  Medication Sig Dispense Refill   Acetaminophen (TYLENOL PO) Take by mouth as needed.      Cholecalciferol (VITAMIN D3) 25 MCG (1000 UT) CAPS Take 1,000 Units by mouth daily.     empagliflozin (JARDIANCE) 10 MG TABS tablet Take 1 tablet (10 mg total) by mouth daily before breakfast. 30 tablet 6   furosemide (LASIX) 80 MG tablet Take 1 tablet (80 mg total) by mouth daily. 30 tablet 11   leflunomide (ARAVA) 20 MG tablet TAKE 1 TABLET BY MOUTH EVERY DAY 30 tablet 2   pantoprazole (PROTONIX) 40 MG tablet TAKE 1 TABLET  BY MOUTH EVERY DAY 30 tablet 0   Potassium Chloride ER 20 MEQ TBCR Take 1 tablet by mouth daily. 30 tablet 3   predniSONE (DELTASONE) 5 MG tablet TAKE 1 TABLET BY MOUTH EVERY DAY WITH BREAKFAST 90 tablet 0  rosuvastatin (CRESTOR) 10 MG tablet TAKE 1 TABLET BY MOUTH EVERY DAY 30 tablet 0   No facility-administered medications prior to visit.    ROS Review of Systems  Constitutional:  Positive for unexpected weight change. Negative for diaphoresis and fatigue.  HENT: Negative.    Eyes: Negative.   Respiratory:  Negative for cough, chest tightness, shortness of breath and wheezing.   Cardiovascular:  Negative for chest pain, palpitations and leg swelling.  Gastrointestinal:  Negative for abdominal pain, constipation, diarrhea and nausea.  Endocrine: Negative for cold intolerance and heat intolerance.  Genitourinary: Negative.  Negative for difficulty urinating and hematuria.  Musculoskeletal:  Positive for arthralgias.  Skin: Negative.   Neurological: Negative.  Negative for dizziness and light-headedness.  Hematological: Negative.  Negative for adenopathy. Does not bruise/bleed easily.  Psychiatric/Behavioral: Negative.     Objective:  BP 126/78 (BP Location: Right Arm, Patient Position: Sitting, Cuff Size: Large)   Pulse 83   Temp 98.8 F (37.1 C) (Oral)   Ht 5\' 5"  (1.651 m)   Wt 202 lb (91.6 kg)   SpO2 95%   BMI 33.61 kg/m   Physical Exam Vitals reviewed.  Constitutional:      Appearance: Normal appearance.  HENT:     Nose: Nose normal.     Mouth/Throat:     Mouth: Mucous membranes are moist.  Eyes:     General: No scleral icterus.    Conjunctiva/sclera: Conjunctivae normal.  Cardiovascular:     Rate and Rhythm: Normal rate and regular rhythm. Occasional Extrasystoles are present.    Heart sounds: Murmur heard.  Systolic murmur is present with a grade of 1/6.  No diastolic murmur is present.    No gallop.  Pulmonary:     Effort: Pulmonary effort is normal.      Breath sounds: No stridor. No wheezing, rhonchi or rales.  Abdominal:     General: Abdomen is flat.     Palpations: There is no mass.     Tenderness: There is no abdominal tenderness. There is no guarding.     Hernia: No hernia is present.  Musculoskeletal:     Cervical back: No tenderness.     Right lower leg: No edema.     Left lower leg: No edema.  Skin:    General: Skin is warm and dry.  Neurological:     General: No focal deficit present.     Mental Status: She is alert.  Psychiatric:        Mood and Affect: Mood normal.        Behavior: Behavior normal.    Lab Results  Component Value Date   WBC 6.7 04/17/2021   HGB 12.5 04/17/2021   HCT 39.6 04/17/2021   PLT 202 04/17/2021   GLUCOSE 90 05/22/2021   CHOL 171 05/22/2021   TRIG 128.0 05/22/2021   HDL 69.70 05/22/2021   LDLDIRECT 121.6 03/15/2012   LDLCALC 75 05/22/2021   ALT 6 04/17/2021   AST 9 04/17/2021   NA 137 05/22/2021   K 3.9 05/22/2021   CL 94 (L) 05/22/2021   CREATININE 1.45 (H) 05/22/2021   BUN 27 (H) 05/22/2021   CO2 35 (H) 05/22/2021   TSH 0.58 05/22/2021   INR 1.0 05/29/2020   HGBA1C 4.7 (L) 03/05/2020     Assessment & Plan:   Tayen was seen today for annual exam and atrial fibrillation.  Diagnoses and all orders for this visit:  Visit for screening mammogram -  MM DIGITAL SCREENING BILATERAL; Future  Estrogen deficiency -     DG Bone Density; Future  Steroid dependence ([place]) -     DG Bone Density; Future  Low TSH level- Her TFT's are normal now and she is euthyroid. -     Thyroid Panel With TSH; Future -     Thyroid peroxidase antibody; Future -     Thyroid peroxidase antibody -     Thyroid Panel With TSH  Vitamin D deficiency -     VITAMIN D 25 Hydroxy (Vit-D Deficiency, Fractures); Future -     VITAMIN D 25 Hydroxy (Vit-D Deficiency, Fractures)  Essential hypertension- Her BP is well controlled -     Basic metabolic panel; Future -     Urinalysis, Routine w reflex  microscopic; Future -     VITAMIN D 25 Hydroxy (Vit-D Deficiency, Fractures); Future -     VITAMIN D 25 Hydroxy (Vit-D Deficiency, Fractures) -     Urinalysis, Routine w reflex microscopic -     Basic metabolic panel  Dyslipidemia, goal LDL below 130- LDL goal achieved. Doing well on the statin  -     Lipid panel; Future -     Lipid panel -     rosuvastatin (CRESTOR) 10 MG tablet; Take 1 tablet (10 mg total) by mouth daily.  Encounter for general adult medical examination with abnormal findings- Exam completed, labs reviewed, vaccines are up-to-date, cancer screenings addressed, patient education was given.  Stage 3b chronic kidney disease ([place])- She will avoid nephrotoxic agents.  Her blood pressure is adequately well controlled.  CAD in native artery, s/p cardiac cath with non obstructive CAD- No recent angina. -     rosuvastatin (CRESTOR) 10 MG tablet; Take 1 tablet (10 mg total) by mouth daily.  I have changed Mingo Amber. Gasbarro's rosuvastatin. I am also having her maintain her Vitamin D3, Acetaminophen (TYLENOL PO), empagliflozin, pantoprazole, Potassium Chloride ER, furosemide, predniSONE, and leflunomide.  Meds ordered this encounter  Medications   rosuvastatin (CRESTOR) 10 MG tablet    Sig: Take 1 tablet (10 mg total) by mouth daily.    Dispense:  90 tablet    Refill:  1      Follow-up: Return in about 6 months (around 11/21/2021).  Scarlette Calico, MD

## 2021-05-22 NOTE — Patient Instructions (Signed)
Health Maintenance, Female Adopting a healthy lifestyle and getting preventive care are important in promoting health and wellness. Ask your health care provider about: The right schedule for you to have regular tests and exams. Things you can do on your own to prevent diseases and keep yourself healthy. What should I know about diet, weight, and exercise? Eat a healthy diet  Eat a diet that includes plenty of vegetables, fruits, low-fat dairy products, and lean protein. Do not eat a lot of foods that are high in solid fats, added sugars, or sodium.  Maintain a healthy weight Body mass index (BMI) is used to identify weight problems. It estimates body fat based on height and weight. Your health care provider can help determineyour BMI and help you achieve or maintain a healthy weight. Get regular exercise Get regular exercise. This is one of the most important things you can do for your health. Most adults should: Exercise for at least 150 minutes each week. The exercise should increase your heart rate and make you sweat (moderate-intensity exercise). Do strengthening exercises at least twice a week. This is in addition to the moderate-intensity exercise. Spend less time sitting. Even light physical activity can be beneficial. Watch cholesterol and blood lipids Have your blood tested for lipids and cholesterol at 68 years of age, then havethis test every 5 years. Have your cholesterol levels checked more often if: Your lipid or cholesterol levels are high. You are older than 68 years of age. You are at high risk for heart disease. What should I know about cancer screening? Depending on your health history and family history, you may need to have cancer screening at various ages. This may include screening for: Breast cancer. Cervical cancer. Colorectal cancer. Skin cancer. Lung cancer. What should I know about heart disease, diabetes, and high blood pressure? Blood pressure and heart  disease High blood pressure causes heart disease and increases the risk of stroke. This is more likely to develop in people who have high blood pressure readings, are of African descent, or are overweight. Have your blood pressure checked: Every 3-5 years if you are 18-39 years of age. Every year if you are 40 years old or older. Diabetes Have regular diabetes screenings. This checks your fasting blood sugar level. Have the screening done: Once every three years after age 40 if you are at a normal weight and have a low risk for diabetes. More often and at a younger age if you are overweight or have a high risk for diabetes. What should I know about preventing infection? Hepatitis B If you have a higher risk for hepatitis B, you should be screened for this virus. Talk with your health care provider to find out if you are at risk forhepatitis B infection. Hepatitis C Testing is recommended for: Everyone born from 1945 through 1965. Anyone with known risk factors for hepatitis C. Sexually transmitted infections (STIs) Get screened for STIs, including gonorrhea and chlamydia, if: You are sexually active and are younger than 68 years of age. You are older than 68 years of age and your health care provider tells you that you are at risk for this type of infection. Your sexual activity has changed since you were last screened, and you are at increased risk for chlamydia or gonorrhea. Ask your health care provider if you are at risk. Ask your health care provider about whether you are at high risk for HIV. Your health care provider may recommend a prescription medicine to help   prevent HIV infection. If you choose to take medicine to prevent HIV, you should first get tested for HIV. You should then be tested every 3 months for as long as you are taking the medicine. Pregnancy If you are about to stop having your period (premenopausal) and you may become pregnant, seek counseling before you get  pregnant. Take 400 to 800 micrograms (mcg) of folic acid every day if you become pregnant. Ask for birth control (contraception) if you want to prevent pregnancy. Osteoporosis and menopause Osteoporosis is a disease in which the bones lose minerals and strength with aging. This can result in bone fractures. If you are 65 years old or older, or if you are at risk for osteoporosis and fractures, ask your health care provider if you should: Be screened for bone loss. Take a calcium or vitamin D supplement to lower your risk of fractures. Be given hormone replacement therapy (HRT) to treat symptoms of menopause. Follow these instructions at home: Lifestyle Do not use any products that contain nicotine or tobacco, such as cigarettes, e-cigarettes, and chewing tobacco. If you need help quitting, ask your health care provider. Do not use street drugs. Do not share needles. Ask your health care provider for help if you need support or information about quitting drugs. Alcohol use Do not drink alcohol if: Your health care provider tells you not to drink. You are pregnant, may be pregnant, or are planning to become pregnant. If you drink alcohol: Limit how much you use to 0-1 drink a day. Limit intake if you are breastfeeding. Be aware of how much alcohol is in your drink. In the [place] one drink equals one 12 oz bottle of beer (355 mL), one 5 oz glass of wine (148 mL), or one 1 oz glass of hard liquor (44 mL). General instructions Schedule regular health, dental, and eye exams. Stay current with your vaccines. Tell your health care provider if: You often feel depressed. You have ever been abused or do not feel safe at home. Summary Adopting a healthy lifestyle and getting preventive care are important in promoting health and wellness. Follow your health care provider's instructions about healthy diet, exercising, and getting tested or screened for diseases. Follow your health care provider's  instructions on monitoring your cholesterol and blood pressure. This information is not intended to replace advice given to you by your health care provider. Make sure you discuss any questions you have with your healthcare provider. Document Revised: 11/03/2018 Document Reviewed: 11/03/2018 Elsevier Patient Education  2022 [employer]  

## 2021-05-23 ENCOUNTER — Encounter: Payer: Self-pay | Admitting: Internal Medicine

## 2021-05-23 DIAGNOSIS — N183 Chronic kidney disease, stage 3 unspecified: Secondary | ICD-10-CM | POA: Insufficient documentation

## 2021-05-23 LAB — URINALYSIS, ROUTINE W REFLEX MICROSCOPIC
Bilirubin Urine: NEGATIVE
Hgb urine dipstick: NEGATIVE
Nitrite: NEGATIVE
RBC / HPF: NONE SEEN (ref 0–?)
Specific Gravity, Urine: 1.01 (ref 1.000–1.030)
Urine Glucose: NEGATIVE
Urobilinogen, UA: 0.2 (ref 0.0–1.0)
pH: 7 (ref 5.0–8.0)

## 2021-05-23 LAB — THYROID PANEL WITH TSH
Free Thyroxine Index: 2.2 (ref 1.4–3.8)
T3 Uptake: 27 % (ref 22–35)
T4, Total: 8.1 ug/dL (ref 5.1–11.9)
TSH: 0.58 mIU/L (ref 0.40–4.50)

## 2021-05-23 LAB — THYROID PEROXIDASE ANTIBODY: Thyroperoxidase Ab SerPl-aCnc: <1 IU/mL (ref <9)

## 2021-05-23 MED ORDER — ROSUVASTATIN CALCIUM 10 MG PO TABS: 10.0000 mg | ORAL_TABLET | Freq: Every day | ORAL | 1 refills | Status: DC

## 2021-05-24 DIAGNOSIS — R338 Other retention of urine: Secondary | ICD-10-CM | POA: Diagnosis not present

## 2021-05-28 ENCOUNTER — Ambulatory Visit: Payer: Medicare HMO | Admitting: Physician Assistant

## 2021-05-28 DIAGNOSIS — M1712 Unilateral primary osteoarthritis, left knee: Secondary | ICD-10-CM | POA: Diagnosis not present

## 2021-05-28 MED ORDER — HYALURONAN 30 MG/2ML IX SOSY
30.0000 mg | PREFILLED_SYRINGE | INTRA_ARTICULAR | Status: AC | PRN
  Administered 2021-05-28: 30 mg via INTRA_ARTICULAR

## 2021-05-28 MED ORDER — LIDOCAINE HCL 1 % IJ SOLN
1.5000 mL | INTRAMUSCULAR | Status: AC | PRN
  Administered 2021-05-28: 1.5 mL

## 2021-05-28 NOTE — Progress Notes (Signed)
   Procedure Note  Patient: Patricia Wagner             Date of Birth: 01-25-1951           MRN: [id number]             Visit Date: 05/28/2021  Procedures: Visit Diagnoses:  1. Primary osteoarthritis of left knee    Orthovisc #1 Left knee joint injection  Large Joint Inj: L knee on 05/28/2021 8:18 AM Indications: pain Details: 25 G 1.5 in needle, medial approach  Arthrogram: No  Medications: 1.5 mL lidocaine 1 %; 30 mg Hyaluronan 30 MG/2ML Aspirate: 0 mL Outcome: tolerated well, no immediate complications Procedure, treatment alternatives, risks and benefits explained, specific risks discussed. Consent was given by the patient. Immediately prior to procedure a time out was called to verify the correct patient, procedure, equipment, support staff and site/side marked as required. Patient was prepped and draped in the usual sterile fashion.    Patient tolerated the procedure well.  Aftercare was discussed.   Hazel Sams, PA-C

## 2021-05-30 LAB — HM DEXA SCAN: HM Dexa Scan: -4.5

## 2021-05-30 NOTE — Progress Notes (Signed)
 Office Visit Note  Patient: Patricia Wagner             Date of Birth: 06/30/1951           MRN: [id number]             PCP: Jones, Thomas L, MD Referring: No ref. provider found Visit Date: 06/13/2021 Occupation: @GUAROCC@  Subjective:  Medication management.   History of Present Illness: Lakelynn S Foucher is a 70 y.o. female with a history of rheumatoid arthritis and osteoarthritis.  She states she has been taking leflunomide 20 mg p.o. daily.  She has been off prednisone.  She is not having any problems coming off prednisone.  She continues to have some stiffness in her joints but no increased joint swelling.  She has been experiencing some numbness numbness in her right hand off and on.  She also has some discomfort in her right shoulder from using cocaine.  Activities of Daily Living:  Patient reports morning stiffness for a couple of  hours.   Patient Denies nocturnal pain.  Difficulty dressing/grooming: Denies Difficulty climbing stairs: Reports Difficulty getting out of chair: Denies Difficulty using hands for taps, buttons, cutlery, and/or writing: Denies  Review of Systems  Constitutional:  Negative for fatigue.  HENT:  Negative for mouth sores, mouth dryness and nose dryness.   Eyes:  Negative for pain, itching and dryness.  Respiratory:  Negative for shortness of breath and difficulty breathing.   Cardiovascular:  Negative for chest pain and palpitations.  Gastrointestinal:  Negative for blood in stool, constipation and diarrhea.  Endocrine: Negative for increased urination.  Genitourinary:  Negative for difficulty urinating.  Musculoskeletal:  Positive for morning stiffness. Negative for joint pain, joint pain, joint swelling, myalgias, muscle tenderness and myalgias.  Skin:  Negative for color change, rash and redness.  Allergic/Immunologic: Negative for susceptible to infections.  Neurological:  Negative for dizziness, numbness, headaches, memory loss and weakness.   Hematological:  Negative for bruising/bleeding tendency.  Psychiatric/Behavioral:  Negative for confusion.    PMFS History:  Patient Active Problem List   Diagnosis Date Noted   CKD (chronic kidney disease) stage 3, GFR 30-59 ml/min (HCC)    Visit for screening mammogram 05/22/2021   Steroid dependence (HCC) 05/22/2021   Estrogen deficiency 05/22/2021   Low TSH level 05/22/2021   Encounter for general adult medical examination with abnormal findings 05/22/2021   Dyslipidemia, goal LDL below 130 05/22/2021   Primary osteoarthritis of right knee 03/20/2020   History of lower GI bleeding 03/20/2020   Persistent atrial fibrillation (HCC) 10/13/2019   Allergic rhinitis 09/10/2019   Vitamin D deficiency 04/15/2019   Acute renal failure superimposed on stage 4 chronic kidney disease (HCC) 03/05/2018   Chronic systolic (congestive) heart failure (HCC) 03/05/2018   Chronic atrial fibrillation 03/05/2018   Urinary retention 11/23/2017   Suspected sleep apnea 05/20/2017   Bradycardia 11/20/2016   Chronic combined systolic (congestive) and diastolic (congestive) heart failure (HCC) 11/20/2016   Cerebrovascular accident (CVA) due to embolism of left cerebellar artery (HCC)    PAF (paroxysmal atrial fibrillation) (HCC)    CHF (congestive heart failure) (HCC) 10/31/2016   CAD in native artery, s/p cardiac cath with non obstructive CAD 10/24/2016   NICM (nonischemic cardiomyopathy) (HCC) 10/24/2016   Tobacco abuse 10/20/2016   Degenerative arthritis of left knee 03/15/2012   Encounter for well adult exam with abnormal findings 03/12/2012   Hypersomnia    MENOPAUSAL DISORDER 01/09/2011   HLD (hyperlipidemia) 02/03/2008     Essential hypertension 07/22/2007   Raynaud's syndrome 07/22/2007    Past Medical History:  Diagnosis Date   Acute blood loss anemia 03/05/2018   Acute hypercapnic respiratory failure ([place]) 04/30/2017   Acute renal failure superimposed on chronic kidney disease ([place])  10/20/2016   Arthritis    hands, knees   Atrial fibrillation ([place])    a. s/p multiple cardioversions; failed tikosyn/sotalol.   Bradycardia 11/20/2016   CAD in native artery, s/p cardiac cath with non obstructive CAD 10/24/2016   Cerebrovascular accident (CVA) due to embolism of left cerebellar artery (HCC)    CHF (congestive heart failure) ([place])    Chronic anticoagulation 03/15/2018   Chronic atrial fibrillation ([address]) 9/76/7341   Chronic systolic (congestive) heart failure ([place]) 03/05/2018   CKD (chronic kidney disease) stage 3, GFR 30-59 ml/min (HCC)    Coagulopathy ([place]) 03/05/2018   DIVERTICULITIS, HX OF 07/25/2007   Diverticulosis of colon with hemorrhage 07/22/2007   Qualifier: Diagnosis of  By: [name]     DIVERTICULOSIS, COLON 07/22/2007   Dysuria 07/21/2017   Edema, peripheral    a. chronic BLE edema, R>L. Prior trauma from dog attack and accident.   Essential hypertension 07/22/2007   Qualifier: Diagnosis of  By: [name]     History of kidney stones    HLD (hyperlipidemia) 02/03/2008   Qualifier: Diagnosis of  By: [name], James W    HYPERLIPIDEMIA 02/03/2008   Hypersomnia    declines w/u   Hypertension    Hypotension (arterial) 04/30/2017   MENOPAUSAL DISORDER 01/09/2011   Morbid obesity ([place]) 07/22/2007   NICM (nonischemic cardiomyopathy) ([place]) 10/24/2016   PAF (paroxysmal atrial fibrillation) ([place])    Presence of indwelling urinary catheter    supra pubic cath   Raynaud's syndrome 07/22/2007   Stroke ([place]) 2017   Suspected sleep apnea 05/20/2017   THYROID NODULE, RIGHT 01/04/2010   VITAMIN D DEFICIENCY 01/09/2011   Qualifier: Diagnosis of  By: [name], [name]     Family History  Problem Relation Age of Onset   Asthma Mother    Diabetes Father    Heart disease Father        Died of presumed heart attack - 34s   Lung disease Sister    Heart disease Sister        Twin sister has heart issue, unclear what kind   Healthy Daughter    Thyroid disease Neg Hx     Colon polyps Neg Hx    Esophageal cancer Neg Hx    Pancreatic cancer Neg Hx    Stomach cancer Neg Hx    Past Surgical History:  Procedure Laterality Date   ATRIAL FIBRILLATION ABLATION N/A 10/13/2019   Procedure: ATRIAL FIBRILLATION ABLATION;  Surgeon: [name], MD;  Location: [place] [facility];  Service: Cardiovascular;  Laterality: N/A;   CARDIAC CATHETERIZATION N/A 10/23/2016   Procedure: Left Heart Cath and Coronary Angiography;  Surgeon: [name], MD;  Location: [place] [facility];  Service: Cardiovascular;  Laterality: N/A;   CARDIOVERSION N/A 10/31/2016   Procedure: CARDIOVERSION;  Surgeon: [name], MD;  Location: [facility] ENDOSCOPY;  Service: Cardiovascular;  Laterality: N/A;   CARDIOVERSION N/A 11/03/2016   Procedure: CARDIOVERSION;  Surgeon: [name], MD;  Location: [place];  Service: Cardiovascular;  Laterality: N/A;   CARDIOVERSION N/A 11/18/2016   Procedure: CARDIOVERSION;  Surgeon: [name], MD;  Location: [facility] ENDOSCOPY;  Service: Cardiovascular;  Laterality: N/A;   CARDIOVERSION N/A 03/25/2017  Procedure: CARDIOVERSION;  Surgeon: [name], MD;  Location: [facility] ENDOSCOPY;  Service: Cardiovascular;  Laterality: N/A;   CARDIOVERSION N/A 05/06/2017   Procedure: CARDIOVERSION;  Surgeon: [name], MD;  Location: [facility] ENDOSCOPY;  Service: Cardiovascular;  Laterality: N/A;   CARDIOVERSION N/A 05/12/2017   Procedure: CARDIOVERSION;  Surgeon: [name], MD;  Location: [facility] ENDOSCOPY;  Service: Cardiovascular;  Laterality: N/A;   COLONOSCOPY N/A 12/04/2017   Procedure: COLONOSCOPY;  Surgeon: [name], MD;  Location: [facility] ENDOSCOPY;  Service: Endoscopy;  Laterality: N/A;   COLONOSCOPY     COLONOSCOPY W/ POLYPECTOMY  02/2011   pan diverticulosis.  tubular adenoma without dysplasia on 5 mm sigmoid polyp.  Dr Fuller Plan.     ESOPHAGOGASTRODUODENOSCOPY (EGD) WITH PROPOFOL N/A 05/16/2020   Procedure: ESOPHAGOGASTRODUODENOSCOPY (EGD)  WITH PROPOFOL;  Surgeon: Milus Banister, MD;  Location: [facility] ENDOSCOPY;  Service: Endoscopy;  Laterality: N/A;   FLEXIBLE SIGMOIDOSCOPY N/A 01/18/2020   Procedure: FLEXIBLE SIGMOIDOSCOPY;  Surgeon: [name], DO;  Location: [place];  Service: Gastroenterology;  Laterality: N/A;   HEMOSTASIS CLIP PLACEMENT  01/18/2020   Procedure: HEMOSTASIS CLIP PLACEMENT;  Surgeon: [name], DO;  Location: [place];  Service: Gastroenterology;;   HOT HEMOSTASIS N/A 05/16/2020   Procedure: HOT HEMOSTASIS (ARGON PLASMA COAGULATION/BICAP);  Surgeon: Milus Banister, MD;  Location: [facility];  Service: Endoscopy;  Laterality: N/A;   PARTIAL HYSTERECTOMY     1 OVARY LEFT   SCLEROTHERAPY  01/18/2020   Procedure: SCLEROTHERAPY;  Surgeon: [name], DO;  Location: [place] ENDOSCOPY;  Service: Gastroenterology;;   TEE WITHOUT CARDIOVERSION N/A 10/31/2016   Procedure: TRANSESOPHAGEAL ECHOCARDIOGRAM (TEE);  Surgeon: [name], MD;  Location: [facility] ENDOSCOPY;  Service: Cardiovascular;  Laterality: N/A;   TEE WITHOUT CARDIOVERSION N/A 03/25/2017   Procedure: TRANSESOPHAGEAL ECHOCARDIOGRAM (TEE);  Surgeon: [name], MD;  Location: [facility] ENDOSCOPY;  Service: Cardiovascular;  Laterality: N/A;   TOTAL KNEE ARTHROPLASTY Right 04/10/2020   Procedure: TOTAL KNEE ARTHROPLASTY;  Surgeon: Renette Butters, MD;  Location: [facility] ORS;  Service: Orthopedics;  Laterality: Right;   Social History   Social History Narrative   Not on file   Immunization History  Administered Date(s) Administered   Fluad Quad(high Dose 65+) 09/07/2019, 09/17/2020   Influenza Whole 01/09/2011   Influenza, High Dose Seasonal PF 08/30/2018   Influenza,inj,Quad PF,6+ Mos 10/25/2015, 10/24/2016, 07/21/2017   PFIZER Comirnaty(Gray Top)Covid-19 Tri-Sucrose Vaccine 12/17/2020   PFIZER(Purple Top)SARS-COV-2 Vaccination 04/28/2020, 05/24/2020   Pneumococcal Conjugate-13 04/15/2019   Pneumococcal Polysaccharide-23 10/24/2016,  03/18/2018   Td 02/02/2009   Tdap 04/15/2019     Objective: Vital Signs: BP (!) 122/58 (BP Location: Left Arm, Patient Position: Sitting, Cuff Size: Normal)   Pulse 71   Ht 5' 5" (1.651 m)   Wt 203 lb 6.4 oz (92.3 kg)   BMI 33.85 kg/m    Physical Exam Vitals and nursing note reviewed.  Constitutional:      Appearance: She is well-developed.  HENT:     Head: Normocephalic and atraumatic.  Eyes:     Conjunctiva/sclera: Conjunctivae normal.  Cardiovascular:     Rate and Rhythm: Normal rate and regular rhythm.     Heart sounds: Normal heart sounds.  Pulmonary:     Effort: Pulmonary effort is normal.     Breath sounds: Normal breath sounds.  Abdominal:     General: Bowel sounds are normal.     Palpations: Abdomen is soft.  Musculoskeletal:     Cervical back:  Normal range of motion.  Lymphadenopathy:     Cervical: No cervical adenopathy.  Skin:    General: Skin is warm and dry.     Capillary Refill: Capillary refill takes less than 2 seconds.  Neurological:     Mental Status: She is alert and oriented to person, place, and time.  Psychiatric:        Behavior: Behavior normal.     Musculoskeletal Exam: C-spine was in good range of motion.  She had good range of motion of her shoulders, elbows, wrist joints.  She has synovial thickening but no synovitis.  PIP and DIP prominence was noted due to osteoarthritis.  Hip joints were difficult to assess in the sitting position.  Knee joints were in good range of motion.  She had no tenderness over ankles or MTPs.  CDAI Exam: CDAI Score: 0.6  Patient Global: 3 mm; Provider Global: 3 mm Swollen: 0 ; Tender: 0  Joint Exam 06/13/2021   No joint exam has been documented for this visit   There is currently no information documented on the homunculus. Go to the Rheumatology activity and complete the homunculus joint exam.  Investigation: No additional findings.  Imaging: No results found.  Recent Labs: Lab Results  Component  Value Date   WBC 6.7 04/17/2021   HGB 12.5 04/17/2021   PLT 202 04/17/2021   NA 137 05/22/2021   K 3.9 05/22/2021   CL 94 (L) 05/22/2021   CO2 35 (H) 05/22/2021   GLUCOSE 90 05/22/2021   BUN 27 (H) 05/22/2021   CREATININE 1.45 (H) 05/22/2021   BILITOT 0.3 04/17/2021   ALKPHOS 84 04/17/2021   AST 9 04/17/2021   ALT 6 04/17/2021   PROT 7.0 04/17/2021   ALBUMIN 4.1 04/17/2021   CALCIUM 9.6 05/22/2021   GFRAA 46 (L) 02/01/2021    Speciality Comments: No specialty comments available.  Procedures:  No procedures performed Allergies: Ace inhibitors   Assessment / Plan:     Visit Diagnoses: Rheumatoid arthritis involving multiple sites with positive rheumatoid factor (HCC) - with nodulosis and severe inflammatory arthritis with RF, positive anti-CCP, positive ANA, elevated ESR: She continues to have some discomfort in her joints.  No synovitis was noted.  Synovial thickening was noted.  She has been off prednisone and has been taking leflunomide 20 mg p.o. daily which she is tolerating well.  Rheumatoid nodulosis (HCC)  High risk medication use - Arava 20 mg 1 tablet by mouth daily.  Labs from Apr 17, 2021 were stable with elevated creatinine.  We will check labs again in August and then every 3 months to monitor for drug toxicity.  She has been advised to stop leflunomide in case she develops COVID-19 infection or any other infection and resume after infection resolves.  Information regarding vaccinations was placed in the AVS.  Positive ANA (antinuclear antibody) - ENA negative, C3-C4 normal.  She has no clinical features of systemic lupus.  Raynaud's disease without gangrene-not active.  Primary osteoarthritis of left knee - s/p orthovisc 05/28/2021  Status post total knee replacement, right-chronic pain.  Hyperuricemia - June 19, 2020 uric acid 9.1.  She denies any symptoms of gout.  Stage 3b chronic kidney disease ([place]) - She sees her nephrologist, Dr. Justin Mend, on a yearly  basis.  CAD in native artery, s/p cardiac cath with non obstructive CAD  Chronic systolic congestive heart failure ([place]) - Followed by [name].  Chronic atrial fibrillation  Chronic anticoagulation  NICM (nonischemic cardiomyopathy) ([place])  Essential hypertension  Cerebrovascular accident (CVA) due to embolism of left cerebellar artery (HCC)  Vitamin D deficiency  Bacteremia due to Gram-negative bacteria - patient does not recall when she had sepsis.  Diverticulosis of colon with hemorrhage  Osteoporosis screening - patient wants to discuss DEXA scan with her PCP.  Orders: No orders of the defined types were placed in this encounter.  No orders of the defined types were placed in this encounter.    Follow-Up Instructions: Return in about 4 months (around 10/14/2021) for Rheumatoid arthritis.   Bo Merino, MD  Note - This record has been created using Editor, commissioning.  Chart creation errors have been sought, but may not always  have been located. Such creation errors do not reflect on  the standard of medical care.

## 2021-06-03 ENCOUNTER — Ambulatory Visit: Payer: Medicare HMO | Admitting: Physician Assistant

## 2021-06-03 ENCOUNTER — Other Ambulatory Visit: Payer: Self-pay

## 2021-06-03 DIAGNOSIS — M1712 Unilateral primary osteoarthritis, left knee: Secondary | ICD-10-CM

## 2021-06-03 MED ORDER — LIDOCAINE HCL 1 % IJ SOLN
1.5000 mL | INTRAMUSCULAR | Status: AC | PRN
  Administered 2021-06-03: 1.5 mL via INTRA_ARTICULAR

## 2021-06-03 MED ORDER — HYALURONAN 30 MG/2ML IX SOSY
30.0000 mg | PREFILLED_SYRINGE | INTRA_ARTICULAR | Status: AC | PRN
  Administered 2021-06-03: 30 mg via INTRA_ARTICULAR

## 2021-06-03 NOTE — Progress Notes (Signed)
   Procedure Note  Patient: Patricia Wagner             Date of Birth: November 16, 1951           MRN: [id number]             Visit Date: 06/03/2021  Procedures: Visit Diagnoses:  1. Primary osteoarthritis of left knee    Orthovisc #2 left knee  Large Joint Inj: L knee on 06/03/2021 9:42 AM Indications: pain Details: 25 G 1.5 in medial  Arthrogram: No  Medications: 1.5 mL lidocaine 1 %; 30 mg Hyaluronan 30 MG/2ML Aspirate: 0 mL Outcome: tolerated well, no immediate complications   Patient tolerated the procedure well.  Aftercare was discussed.  Hazel Sams, PA-C

## 2021-06-05 ENCOUNTER — Telehealth (HOSPITAL_COMMUNITY): Payer: Self-pay | Admitting: *Deleted

## 2021-06-05 ENCOUNTER — Other Ambulatory Visit (HOSPITAL_COMMUNITY): Payer: Self-pay | Admitting: *Deleted

## 2021-06-05 DIAGNOSIS — N312 Flaccid neuropathic bladder, not elsewhere classified: Secondary | ICD-10-CM | POA: Diagnosis not present

## 2021-06-05 DIAGNOSIS — I5022 Chronic systolic (congestive) heart failure: Secondary | ICD-10-CM

## 2021-06-05 NOTE — Telephone Encounter (Signed)
Pt called to report her HR is jumping and racing at times. Per Dr [name] ok to bring pt in for EKG and Zio placement  Pt aware and agreeable, appt sch for tomorrow

## 2021-06-06 ENCOUNTER — Other Ambulatory Visit: Payer: Self-pay

## 2021-06-06 ENCOUNTER — Other Ambulatory Visit (HOSPITAL_COMMUNITY): Payer: Self-pay | Admitting: Internal Medicine

## 2021-06-06 ENCOUNTER — Ambulatory Visit (HOSPITAL_COMMUNITY)
Admission: RE | Admit: 2021-06-06 | Discharge: 2021-06-06 | Disposition: A | Discharge status: Home / Self Care | Payer: Medicare HMO | Source: Ambulatory Visit | Attending: Internal Medicine | Admitting: Internal Medicine

## 2021-06-06 ENCOUNTER — Ambulatory Visit (HOSPITAL_COMMUNITY)
Admission: RE | Admit: 2021-06-06 | Discharge: 2021-06-06 | Disposition: A | Discharge status: Home / Self Care | Payer: Medicare HMO | Source: Ambulatory Visit | Attending: Cardiology | Admitting: Cardiology

## 2021-06-06 DIAGNOSIS — I493 Ventricular premature depolarization: Secondary | ICD-10-CM

## 2021-06-06 DIAGNOSIS — I491 Atrial premature depolarization: Secondary | ICD-10-CM | POA: Insufficient documentation

## 2021-06-10 ENCOUNTER — Other Ambulatory Visit: Payer: Self-pay | Admitting: Physician Assistant

## 2021-06-10 ENCOUNTER — Other Ambulatory Visit: Payer: Self-pay

## 2021-06-10 ENCOUNTER — Ambulatory Visit: Payer: Medicare HMO | Admitting: Physician Assistant

## 2021-06-10 ENCOUNTER — Other Ambulatory Visit: Payer: Self-pay | Admitting: Internal Medicine

## 2021-06-10 DIAGNOSIS — M1712 Unilateral primary osteoarthritis, left knee: Secondary | ICD-10-CM | POA: Diagnosis not present

## 2021-06-10 DIAGNOSIS — Z1231 Encounter for screening mammogram for malignant neoplasm of breast: Secondary | ICD-10-CM

## 2021-06-10 MED ORDER — LIDOCAINE HCL 1 % IJ SOLN
1.5000 mL | INTRAMUSCULAR | Status: AC | PRN
  Administered 2021-06-10: 1.5 mL via INTRA_ARTICULAR

## 2021-06-10 MED ORDER — HYALURONAN 30 MG/2ML IX SOSY
30.0000 mg | PREFILLED_SYRINGE | INTRA_ARTICULAR | Status: AC | PRN
  Administered 2021-06-10: 30 mg via INTRA_ARTICULAR

## 2021-06-10 NOTE — Progress Notes (Signed)
   Procedure Note  Patient: Patricia Wagner             Date of Birth: 1951/02/16           MRN: [id number]             Visit Date: 06/10/2021  Procedures: Visit Diagnoses:  1. Primary osteoarthritis of left knee      ORTHOVISC #3 LEFT KNEE, B/B Large Joint Inj: L knee on 06/10/2021 11:06 AM Indications: pain Details: 25 G 1.5 in medial  Arthrogram: No  Medications: 1.5 mL lidocaine 1 %; 30 mg Hyaluronan 30 MG/2ML Aspirate: 0 mL Outcome: tolerated well, no immediate complications   Patient tolerated the procedure well.  Aftercare was discussed.  Hazel Sams, PA-C

## 2021-06-13 ENCOUNTER — Ambulatory Visit: Payer: Medicare HMO | Admitting: Rheumatology

## 2021-06-13 ENCOUNTER — Other Ambulatory Visit: Payer: Self-pay

## 2021-06-13 ENCOUNTER — Encounter: Payer: Self-pay | Admitting: Rheumatology

## 2021-06-13 VITALS — BP 122/58 | HR 71 | Ht 65.0 in | Wt 203.4 lb

## 2021-06-13 DIAGNOSIS — M0579 Rheumatoid arthritis with rheumatoid factor of multiple sites without organ or systems involvement: Secondary | ICD-10-CM | POA: Diagnosis not present

## 2021-06-13 DIAGNOSIS — M0630 Rheumatoid nodule, unspecified site: Secondary | ICD-10-CM | POA: Diagnosis not present

## 2021-06-13 DIAGNOSIS — E559 Vitamin D deficiency, unspecified: Secondary | ICD-10-CM

## 2021-06-13 DIAGNOSIS — E790 Hyperuricemia without signs of inflammatory arthritis and tophaceous disease: Secondary | ICD-10-CM

## 2021-06-13 DIAGNOSIS — N1832 Chronic kidney disease, stage 3b: Secondary | ICD-10-CM

## 2021-06-13 DIAGNOSIS — I428 Other cardiomyopathies: Secondary | ICD-10-CM

## 2021-06-13 DIAGNOSIS — M1712 Unilateral primary osteoarthritis, left knee: Secondary | ICD-10-CM | POA: Diagnosis not present

## 2021-06-13 DIAGNOSIS — I482 Chronic atrial fibrillation, unspecified: Secondary | ICD-10-CM

## 2021-06-13 DIAGNOSIS — I7300 Raynaud's syndrome without gangrene: Secondary | ICD-10-CM | POA: Diagnosis not present

## 2021-06-13 DIAGNOSIS — K5731 Diverticulosis of large intestine without perforation or abscess with bleeding: Secondary | ICD-10-CM

## 2021-06-13 DIAGNOSIS — Z96651 Presence of right artificial knee joint: Secondary | ICD-10-CM

## 2021-06-13 DIAGNOSIS — I2510 Atherosclerotic heart disease of native coronary artery without angina pectoris: Secondary | ICD-10-CM

## 2021-06-13 DIAGNOSIS — I10 Essential (primary) hypertension: Secondary | ICD-10-CM

## 2021-06-13 DIAGNOSIS — R7689 Other specified abnormal immunological findings in serum: Secondary | ICD-10-CM

## 2021-06-13 DIAGNOSIS — R7881 Bacteremia: Secondary | ICD-10-CM

## 2021-06-13 DIAGNOSIS — Z79899 Other long term (current) drug therapy: Secondary | ICD-10-CM | POA: Diagnosis not present

## 2021-06-13 DIAGNOSIS — Z13820 Encounter for screening for osteoporosis: Secondary | ICD-10-CM

## 2021-06-13 DIAGNOSIS — Z7901 Long term (current) use of anticoagulants: Secondary | ICD-10-CM

## 2021-06-13 DIAGNOSIS — R768 Other specified abnormal immunological findings in serum: Secondary | ICD-10-CM

## 2021-06-13 DIAGNOSIS — I5022 Chronic systolic (congestive) heart failure: Secondary | ICD-10-CM

## 2021-06-13 DIAGNOSIS — I63442 Cerebral infarction due to embolism of left cerebellar artery: Secondary | ICD-10-CM

## 2021-06-13 NOTE — Patient Instructions (Signed)
Standing Labs We placed an order today for your standing lab work.   Please have your standing labs drawn in August and every 3 months  If possible, please have your labs drawn 2 weeks prior to your appointment so that the provider can discuss your results at your appointment.  Please note that you may see your imaging and lab results in [place] before we have reviewed them. We may be awaiting multiple results to interpret others before contacting you. Please allow our office up to 72 hours to thoroughly review all of the results before contacting the office for clarification of your results.  We have open lab daily: Monday through Thursday from 1:30-4:30 PM and Friday from 1:30-4:00 PM at the office of Dr. Bo Merino, [facility].   Please be advised, all patients with office appointments requiring lab work will take precedent over walk-in lab work.  If possible, please come for your lab work on Monday and Friday afternoons, as you may experience shorter wait times. The office is located at [address], [place], [place], [place] [address] No appointment is necessary.   Labs are drawn by [facility]. Please bring your co-pay at the time of your lab draw.  You may receive a bill from [facility] for your lab work.  If you wish to have your labs drawn at another location, please call the office 24 hours in advance to send orders.  If you have any questions regarding directions or hours of operation,  please call [phone].   As a reminder, please drink plenty of water prior to coming for your lab work. Thanks!  Vaccines You are taking a medication(s) that can suppress your immune system.  The following immunizations are recommended: Flu annually Covid-19  Td/Tdap (tetanus, diphtheria, pertussis) every 10 years Pneumonia (Prevnar 15 then Pneumovax 23 at least 1 year apart.  Alternatively, can take Prevnar 20 without needing additional dose) Shingrix (after age 77): 2 doses  from 4 weeks to 6 months apart  Please check with your PCP to make sure you are up to date.   If you test POSITIVE for COVID19 and have MILD to MODERATE symptoms: First, call your PCP if you would like to receive COVID19 treatment AND Hold your medications during the infection and for at least 1 week after your symptoms have resolved: Injectable medication (Benlysta, Cimzia, Cosentyx, Enbrel, Humira, Orencia, Remicade, Simponi, Stelara, Taltz, Tremfya) Methotrexate Leflunomide (Arava) Azathioprine Mycophenolate (Cellcept) Morrie Sheldon, Olumiant, or Rinvoq If you take Actemra or Kevzara, you DO NOT need to hold these for COVID19 infection.  If you test POSITIVE for COVID19 and have NO symptoms: First, call your PCP if you would like to receive COVID19 treatment AND Hold your medications for at least 10 days after the day that you tested positive Injectable medication (Benlysta, Cimzia, Cosentyx, Enbrel, Humira, Orencia, Remicade, Simponi, Stelara, Taltz, Tremfya) Methotrexate Leflunomide (Arava) Azathioprine Mycophenolate (Cellcept) Morrie Sheldon, Olumiant, or Rinvoq If you take Actemra or Kevzara, you DO NOT need to hold these for COVID19 infection.   If you have signs or symptoms of an infection or start antibiotics: First, call your PCP for workup of your infection. Hold your medication through the infection, until you complete your antibiotics, and until symptoms resolve if you take the following: Injectable medication (Actemra, Benlysta, Cimzia, Cosentyx, Enbrel, Humira, Kevzara, Orencia, Remicade, Simponi, Stelara, Taltz, Tremfya) Methotrexate Leflunomide (Arava) Mycophenolate (Cellcept) Roma Kayser, or Rinvoq   Heart Disease Prevention   Your inflammatory disease increases your risk of heart  disease which includes heart attack, stroke, atrial fibrillation (irregular heartbeats), high blood pressure, heart failure and atherosclerosis (plaque in the arteries).  It is important to  reduce your risk by:   Keep blood pressure, cholesterol, and blood sugar at healthy levels   Smoking Cessation   Maintain a healthy weight  BMI 20-25   Eat a healthy diet  Plenty of fresh fruit, vegetables, and whole grains  Limit saturated fats, foods high in sodium, and added sugars  DASH and Mediterranean diet   Increase physical activity  Recommend moderate physically activity for 150 minutes per week/ 30 minutes a day for five days a week These can be broken up into three separate ten-minute sessions during the day.   Reduce Stress  Meditation, slow breathing exercises, yoga, coloring books  Dental visits twice a year

## 2021-06-14 DIAGNOSIS — I493 Ventricular premature depolarization: Secondary | ICD-10-CM | POA: Diagnosis not present

## 2021-06-19 DIAGNOSIS — T83091D Other mechanical complication of indwelling urethral catheter, subsequent encounter: Secondary | ICD-10-CM | POA: Diagnosis not present

## 2021-06-19 DIAGNOSIS — R338 Other retention of urine: Secondary | ICD-10-CM | POA: Diagnosis not present

## 2021-06-20 ENCOUNTER — Ambulatory Visit
Admission: RE | Admit: 2021-06-20 | Discharge: 2021-06-20 | Disposition: A | Discharge status: Home / Self Care | Payer: Medicare HMO | Source: Ambulatory Visit | Attending: Internal Medicine | Admitting: Internal Medicine

## 2021-06-20 ENCOUNTER — Other Ambulatory Visit: Payer: Self-pay

## 2021-06-20 DIAGNOSIS — E2839 Other primary ovarian failure: Secondary | ICD-10-CM

## 2021-06-20 DIAGNOSIS — M810 Age-related osteoporosis without current pathological fracture: Secondary | ICD-10-CM | POA: Diagnosis not present

## 2021-06-20 DIAGNOSIS — Z780 Asymptomatic menopausal state: Secondary | ICD-10-CM | POA: Diagnosis not present

## 2021-06-20 DIAGNOSIS — Z1231 Encounter for screening mammogram for malignant neoplasm of breast: Secondary | ICD-10-CM

## 2021-06-20 DIAGNOSIS — F1920 Other psychoactive substance dependence, uncomplicated: Secondary | ICD-10-CM

## 2021-06-25 DIAGNOSIS — N312 Flaccid neuropathic bladder, not elsewhere classified: Secondary | ICD-10-CM | POA: Diagnosis not present

## 2021-06-25 DIAGNOSIS — R338 Other retention of urine: Secondary | ICD-10-CM | POA: Diagnosis not present

## 2021-06-27 ENCOUNTER — Telehealth: Payer: Self-pay | Admitting: Internal Medicine

## 2021-06-27 NOTE — Telephone Encounter (Signed)
Patient would like for provider or nurse to give her a call back in regards to test results from last week  [phone]

## 2021-06-27 NOTE — Telephone Encounter (Signed)
Called pt, LVM.   

## 2021-07-03 ENCOUNTER — Other Ambulatory Visit: Payer: Self-pay

## 2021-07-03 NOTE — Patient Outreach (Signed)
[facility]) Care Management  07/03/2021  Patricia Wagner 1951-03-08 [id number]   Telephone Assessment   Unsuccessful outreach attempt to patient. No answer at present and no voicemail set up.     Plan: RN CM will make outreach attempt to patient within the month of Oct if no return call from patient.    Enzo Montgomery, RN,BSN,CCM [facility] Telephonic Care Management Coordinator Direct Phone: [phone] Toll Free: [phone] Fax: [phone]

## 2021-07-11 ENCOUNTER — Other Ambulatory Visit: Payer: Self-pay

## 2021-07-11 DIAGNOSIS — Z79899 Other long term (current) drug therapy: Secondary | ICD-10-CM

## 2021-07-12 LAB — CBC WITH DIFFERENTIAL/PLATELET
Absolute Monocytes: 594 cells/uL (ref 200–950)
Basophils Absolute: 40 cells/uL (ref 0–200)
Basophils Relative: 0.6 %
Eosinophils Absolute: 119 cells/uL (ref 15–500)
Eosinophils Relative: 1.8 %
HCT: 40.3 % (ref 35.0–45.0)
Hemoglobin: 12.3 g/dL (ref 11.7–15.5)
Lymphs Abs: 1346 cells/uL (ref 850–3900)
MCH: 27.5 pg (ref 27.0–33.0)
MCHC: 30.5 g/dL — ABNORMAL LOW (ref 32.0–36.0)
MCV: 90.2 fL (ref 80.0–100.0)
MPV: 11.3 fL (ref 7.5–12.5)
Monocytes Relative: 9 %
Neutro Abs: 4501 cells/uL (ref 1500–7800)
Neutrophils Relative %: 68.2 %
Platelets: 231 10*3/uL (ref 140–400)
RBC: 4.47 10*6/uL (ref 3.80–5.10)
RDW: 13.4 % (ref 11.0–15.0)
Total Lymphocyte: 20.4 %
WBC: 6.6 10*3/uL (ref 3.8–10.8)

## 2021-07-12 LAB — COMPLETE METABOLIC PANEL WITH GFR
AG Ratio: 1.3 (calc) (ref 1.0–2.5)
ALT: 9 U/L (ref 6–29)
AST: 12 U/L (ref 10–35)
Albumin: 3.6 g/dL (ref 3.6–5.1)
Alkaline phosphatase (APISO): 59 U/L (ref 37–153)
BUN/Creatinine Ratio: 21 (calc) (ref 6–22)
BUN: 27 mg/dL — ABNORMAL HIGH (ref 7–25)
CO2: 32 mmol/L (ref 20–32)
Calcium: 9.8 mg/dL (ref 8.6–10.4)
Chloride: 102 mmol/L (ref 98–110)
Creat: 1.31 mg/dL — ABNORMAL HIGH (ref 0.50–1.05)
Globulin: 2.7 g/dL (calc) (ref 1.9–3.7)
Glucose, Bld: 90 mg/dL (ref 65–99)
Potassium: 3.8 mmol/L (ref 3.5–5.3)
Sodium: 141 mmol/L (ref 135–146)
Total Bilirubin: 0.4 mg/dL (ref 0.2–1.2)
Total Protein: 6.3 g/dL (ref 6.1–8.1)
eGFR: 45 mL/min/{1.73_m2} — ABNORMAL LOW (ref >=60)

## 2021-07-14 ENCOUNTER — Other Ambulatory Visit: Payer: Self-pay | Admitting: Physician Assistant

## 2021-07-21 ENCOUNTER — Other Ambulatory Visit: Payer: Self-pay | Admitting: Rheumatology

## 2021-07-22 DIAGNOSIS — N312 Flaccid neuropathic bladder, not elsewhere classified: Secondary | ICD-10-CM | POA: Diagnosis not present

## 2021-07-22 DIAGNOSIS — R338 Other retention of urine: Secondary | ICD-10-CM | POA: Diagnosis not present

## 2021-07-22 NOTE — Telephone Encounter (Signed)
Next Visit: 10/15/2021  Last Visit: 06/13/2021  Last Fill: 04/17/2021  Dx: Rheumatoid arthritis involving multiple sites with positive rheumatoid factor   Current Dose per office note on 06/13/2021: not discussed  Okay to refill Prednisone?

## 2021-08-03 ENCOUNTER — Other Ambulatory Visit (HOSPITAL_COMMUNITY): Payer: Self-pay | Admitting: Cardiology

## 2021-08-03 ENCOUNTER — Other Ambulatory Visit: Payer: Self-pay | Admitting: Physician Assistant

## 2021-08-05 NOTE — Telephone Encounter (Signed)
Next Visit: 10/15/2021   Last Visit: 06/13/2021   Last Fill: 05/15/2021   Dx: Rheumatoid arthritis involving multiple sites with positive rheumatoid factor    Current Dose per office note on 06/13/2021: Arava 20 mg 1 tablet by mouth daily  Labs: 07/11/2021 Creatinine remains elevated but is trending down.  eGFR is 45.  MCHC remains low but stable. Rest of CBC WNL.   Okay to refill Arava?  

## 2021-08-12 ENCOUNTER — Ambulatory Visit (HOSPITAL_COMMUNITY)
Admission: RE | Admit: 2021-08-12 | Discharge: 2021-08-12 | Disposition: A | Discharge status: Home / Self Care | Payer: Medicare HMO | Source: Ambulatory Visit | Attending: Internal Medicine | Admitting: Internal Medicine

## 2021-08-12 ENCOUNTER — Encounter (HOSPITAL_COMMUNITY): Payer: Self-pay | Admitting: Internal Medicine

## 2021-08-12 ENCOUNTER — Other Ambulatory Visit: Payer: Self-pay

## 2021-08-12 ENCOUNTER — Telehealth (HOSPITAL_COMMUNITY): Payer: Self-pay | Admitting: Pharmacy Technician

## 2021-08-12 ENCOUNTER — Ambulatory Visit (HOSPITAL_BASED_OUTPATIENT_CLINIC_OR_DEPARTMENT_OTHER)
Admission: RE | Admit: 2021-08-12 | Discharge: 2021-08-12 | Disposition: A | Discharge status: Home / Self Care | Payer: Medicare HMO | Source: Ambulatory Visit | Attending: Internal Medicine | Admitting: Internal Medicine

## 2021-08-12 ENCOUNTER — Other Ambulatory Visit (HOSPITAL_COMMUNITY): Payer: Self-pay

## 2021-08-12 ENCOUNTER — Encounter (HOSPITAL_COMMUNITY): Payer: Self-pay

## 2021-08-12 VITALS — BP 140/78 | HR 76 | Wt 209.0 lb

## 2021-08-12 DIAGNOSIS — Z8719 Personal history of other diseases of the digestive system: Secondary | ICD-10-CM | POA: Insufficient documentation

## 2021-08-12 DIAGNOSIS — Z79899 Other long term (current) drug therapy: Secondary | ICD-10-CM | POA: Insufficient documentation

## 2021-08-12 DIAGNOSIS — Z87440 Personal history of urinary (tract) infections: Secondary | ICD-10-CM | POA: Insufficient documentation

## 2021-08-12 DIAGNOSIS — Z8673 Personal history of transient ischemic attack (TIA), and cerebral infarction without residual deficits: Secondary | ICD-10-CM | POA: Insufficient documentation

## 2021-08-12 DIAGNOSIS — I5022 Chronic systolic (congestive) heart failure: Secondary | ICD-10-CM | POA: Diagnosis not present

## 2021-08-12 DIAGNOSIS — Z87442 Personal history of urinary calculi: Secondary | ICD-10-CM | POA: Insufficient documentation

## 2021-08-12 DIAGNOSIS — Z8249 Family history of ischemic heart disease and other diseases of the circulatory system: Secondary | ICD-10-CM | POA: Insufficient documentation

## 2021-08-12 DIAGNOSIS — I10 Essential (primary) hypertension: Secondary | ICD-10-CM

## 2021-08-12 DIAGNOSIS — Z7952 Long term (current) use of systemic steroids: Secondary | ICD-10-CM | POA: Insufficient documentation

## 2021-08-12 DIAGNOSIS — I428 Other cardiomyopathies: Secondary | ICD-10-CM | POA: Diagnosis not present

## 2021-08-12 DIAGNOSIS — I493 Ventricular premature depolarization: Secondary | ICD-10-CM | POA: Diagnosis not present

## 2021-08-12 DIAGNOSIS — Z87891 Personal history of nicotine dependence: Secondary | ICD-10-CM | POA: Diagnosis not present

## 2021-08-12 DIAGNOSIS — I130 Hypertensive heart and chronic kidney disease with heart failure and stage 1 through stage 4 chronic kidney disease, or unspecified chronic kidney disease: Secondary | ICD-10-CM | POA: Diagnosis not present

## 2021-08-12 DIAGNOSIS — I340 Nonrheumatic mitral (valve) insufficiency: Secondary | ICD-10-CM | POA: Diagnosis not present

## 2021-08-12 DIAGNOSIS — I480 Paroxysmal atrial fibrillation: Secondary | ICD-10-CM | POA: Diagnosis not present

## 2021-08-12 DIAGNOSIS — N319 Neuromuscular dysfunction of bladder, unspecified: Secondary | ICD-10-CM | POA: Insufficient documentation

## 2021-08-12 DIAGNOSIS — Z960 Presence of urogenital implants: Secondary | ICD-10-CM | POA: Diagnosis not present

## 2021-08-12 DIAGNOSIS — E785 Hyperlipidemia, unspecified: Secondary | ICD-10-CM | POA: Insufficient documentation

## 2021-08-12 DIAGNOSIS — R338 Other retention of urine: Secondary | ICD-10-CM | POA: Insufficient documentation

## 2021-08-12 DIAGNOSIS — N1832 Chronic kidney disease, stage 3b: Secondary | ICD-10-CM | POA: Diagnosis not present

## 2021-08-12 DIAGNOSIS — I4819 Other persistent atrial fibrillation: Secondary | ICD-10-CM | POA: Diagnosis not present

## 2021-08-12 DIAGNOSIS — I5082 Biventricular heart failure: Secondary | ICD-10-CM | POA: Insufficient documentation

## 2021-08-12 DIAGNOSIS — J449 Chronic obstructive pulmonary disease, unspecified: Secondary | ICD-10-CM | POA: Insufficient documentation

## 2021-08-12 DIAGNOSIS — Z7984 Long term (current) use of oral hypoglycemic drugs: Secondary | ICD-10-CM | POA: Insufficient documentation

## 2021-08-12 DIAGNOSIS — I2510 Atherosclerotic heart disease of native coronary artery without angina pectoris: Secondary | ICD-10-CM | POA: Insufficient documentation

## 2021-08-12 LAB — ECHOCARDIOGRAM COMPLETE
AR max vel: 1.28 cm2
AV Area VTI: 1.18 cm2
AV Area mean vel: 1.18 cm2
AV Mean grad: 6 mmHg
AV Peak grad: 10 mmHg
Ao pk vel: 1.58 m/s
Calc EF: 35.8 %
S' Lateral: 6 cm
Single Plane A2C EF: 30.7 %
Single Plane A4C EF: 39.3 %

## 2021-08-12 MED ORDER — ENTRESTO 24-26 MG PO TABS: 1.0000 | ORAL_TABLET | Freq: Two times a day (BID) | ORAL | 3 refills | Status: DC

## 2021-08-12 MED ORDER — FUROSEMIDE 80 MG PO TABS: 80.0000 mg | ORAL_TABLET | Freq: Every day | ORAL | 11 refills | Status: DC

## 2021-08-12 MED ORDER — MEXILETINE HCL 200 MG PO CAPS: 200.0000 mg | ORAL_CAPSULE | Freq: Two times a day (BID) | ORAL | 6 refills | Status: DC

## 2021-08-12 NOTE — Progress Notes (Signed)
Medication Samples have been provided to the patient.  Drug name: Jardiance       Strength: 10 mg        Qty: 4  LOT: [id number]  Exp.Date: 09/23  Dosing instructions: Take 1 tablet daily  The patient has been instructed regarding the correct time, dose, and frequency of taking this medication, including desired effects and most common side effects.   [name] 3:27 PM 08/12/2021

## 2021-08-12 NOTE — Patient Instructions (Signed)
Start Entresto 24/26 mg Twice daily   Start Mexiletine 200 mg Twice daily    Continue Furosemide 80 mg Daily, ok to take extra tab AS NEEDED  Your physician recommends that you schedule a follow-up appointment in: 6 months, PLEASE CALL OUR OFFICE IN February TO SCHEDULE THIS APPOINTMENT   Do the following things EVERYDAY: Weigh yourself in the morning before breakfast. Write it down and keep it in a log. Take your medicines as prescribed Eat low salt foods--Limit salt (sodium) to 2000 mg per day.  Stay as active as you can everyday Limit all fluids for the day to less than 2 liters  If you have any questions or concerns before your next appointment please send [place] a message through [place] or call our office at [phone].    TO LEAVE A MESSAGE FOR THE NURSE SELECT OPTION 2, PLEASE LEAVE A MESSAGE INCLUDING: YOUR NAME DATE OF BIRTH CALL BACK NUMBER REASON FOR CALL**this is important as we prioritize the call backs  YOU WILL RECEIVE A CALL BACK THE SAME DAY AS LONG AS YOU CALL BEFORE 4:00 PM  At the [facility], you and your health needs are our priority. As part of our continuing mission to provide you with exceptional heart care, we have created designated Provider Care Teams. These Care Teams include your primary Cardiologist (physician) and Advanced Practice Providers (APPs- Physician Assistants and Nurse Practitioners) who all work together to provide you with the care you need, when you need it.   You may see any of the following providers on your designated Care Team at your next follow up: Dr Glori Bickers Dr Loralie Champagne Dr Patrice Paradise, NP Lyda Jester, [place] Ginnie Smart Audry Riles, PharmD   Please be sure to bring in all your medications bottles to every appointment.

## 2021-08-12 NOTE — Progress Notes (Signed)
  Echocardiogram 2D Echocardiogram has been performed.  Patricia Wagner F 08/12/2021, 2:05 PM

## 2021-08-12 NOTE — Telephone Encounter (Signed)
Advanced Heart Failure Patient Advocate Encounter  Patient was seen in clinic today and expressed concerns regarding Jardiance affordability. I could not see the co-pay, as it is currently billed at the pharmacy. The patient was approved for a PAN HF grant to help cover the cost. A copy of the grant information was provided to take to the pharmacy.   Member ID: [id number] Group ID: [id number] RxBin ID: [id number] PCN: [id number] Eligibility Start Date: 05/14/2021 Eligibility End Date: 08/11/2022 Assistance Amount: $1,000.00  Charlann Boxer, CPhT

## 2021-08-12 NOTE — Progress Notes (Addendum)
Advanced Heart Failure Clinic Note   PCP: Dr Wille Glaser  Primary Cardiologist: Dr. [name] Primary HF: [name] Urologist:  Nephrology: Dr Justin Mend EP:  Dr Curt Bears  HPI: Patricia Wagner is a 70 y.o. female with a past medical history of paroxysmal atrial fibrillation, NICM cardiomyopathy, Biventricular CHF, HTN, chronic foley due to urinary retentin, CKD Stage III, and HLD.    She has struggled with symptomatic afib since 10/2016.   Pt admitted to [facility] 6/18 with cardiogenic shock in the setting of persistent AF. Had tachy CM with EF 15% with severe biventricular dysfunction. Started on amio gtt and PICC line placed for dobutamine with low output HF. Underwent DCCV 05/06/17 but went back into Afib 05/08/17 and developed recurrent cardiogenic shock, requiring re-initiation of dobutamine and amio. Pt had repeat DCCV 05/12/17 with successful conversion to NSR. Discharged on amio and Ranexa.  Echo 8/18 EF 30-35%,severe left ventricular hypertrophy, mild to moderate mitral regurgitation, biatrial enlargement and moderately reduced RV function. Fat pad bx negative. PYP 10/18 felt equivocal but not favored for amyloid.    Admitted 2/19 with UTI and AKI. Found to have neurogenic bladder with urinary retention .Creatinine peaked at 7. Lasix, spiro, and entresto stopped. Required home IV antibiotics and discharged with foley. At the time of discharge creatinine was down to 1.07.   Admitted 4/19 with lower GI bleed. Colonoscopy showed diverticular disease. Tagged NM scan 03/15/18 with no active bleeding  Admitted 9/19. With acute hypoxic respiratory failure due a/c systolic/diastolic heart failure. Required short term intubation and diuresis with IV lasix.   Echo 8/20 EF 45-50%.    She is now status post AF ablation 10/13/2019. Eliquis stopped due to recurrent GI bleeding. Follows with Dr. Dia Sitter 7/22 1. Sinus rhythm - min HR 66 bpm, max HR 203 bpm, avg HR of 88 bpm. 2. Seven runs of NSVT - the run  with the fastest interval lasting 5 beats with a max rate of 203 bpm, the longest  lasting 9 beats with an avg rate of 160 bpm.  3. Three runs of SVT - the run with the fastest interval lasting 6 beats with a max rate of 203 bpm, the longest lasting 17 beats with an avg rate of 108 bpm. 4. Frequent PACs  (5.4%, 10918) 5. Frequent PVCs  (7.0%, 14247) - with one predominant morphology (6.6%)    Here for routine f/u. Says she feels fine. Occasional palpitations. Able to do ADLs without too much problem. If walks too fast will get SOB. Occasionally use electric cart.  Takes lasix 80 daily will sometimes take 80 bid. Still getting left knee injections   Echo today EF read as 35-40% (I think closer to 30%)   Review of systems complete and found to be negative unless listed in HPI.    Past Medical History:  Diagnosis Date   Acute blood loss anemia 03/05/2018   Acute hypercapnic respiratory failure ([place]) 04/30/2017   Acute renal failure superimposed on chronic kidney disease ([place]) 10/20/2016   Arthritis    hands, knees   Atrial fibrillation ([place])    a. s/p multiple cardioversions; failed tikosyn/sotalol.   Bradycardia 11/20/2016   CAD in native artery, s/p cardiac cath with non obstructive CAD 10/24/2016   Cerebrovascular accident (CVA) due to embolism of left cerebellar artery (HCC)    CHF (congestive heart failure) ([place])    Chronic anticoagulation 03/15/2018   Chronic atrial fibrillation ([place]) 3/81/0175   Chronic systolic (congestive) heart failure ([place]) 03/05/2018  CKD (chronic kidney disease) stage 3, GFR 30-59 ml/min (HCC)    Coagulopathy ([place]) 03/05/2018   DIVERTICULITIS, HX OF 07/25/2007   Diverticulosis of colon with hemorrhage 07/22/2007   Qualifier: Diagnosis of  By: [name]     DIVERTICULOSIS, COLON 07/22/2007   Dysuria 07/21/2017   Edema, peripheral    a. chronic BLE edema, R>L. Prior trauma from dog attack and accident.   Essential hypertension 07/22/2007   Qualifier:  Diagnosis of  By: [name]     History of kidney stones    HLD (hyperlipidemia) 02/03/2008   Qualifier: Diagnosis of  By: [name], James W    HYPERLIPIDEMIA 02/03/2008   Hypersomnia    declines w/u   Hypertension    Hypotension (arterial) 04/30/2017   MENOPAUSAL DISORDER 01/09/2011   Morbid obesity ([place]) 07/22/2007   NICM (nonischemic cardiomyopathy) ([place]) 10/24/2016   PAF (paroxysmal atrial fibrillation) ([place])    Presence of indwelling urinary catheter    supra pubic cath   Raynaud's syndrome 07/22/2007   Stroke ([place]) 2017   Suspected sleep apnea 05/20/2017   THYROID NODULE, RIGHT 01/04/2010   VITAMIN D DEFICIENCY 01/09/2011   Qualifier: Diagnosis of  By: [name], [name]     Current Outpatient Medications  Medication Sig Dispense Refill   Acetaminophen (TYLENOL PO) Take by mouth as needed.      Cholecalciferol (VITAMIN D3) 25 MCG (1000 UT) CAPS Take 1,000 Units by mouth daily.     empagliflozin (JARDIANCE) 10 MG TABS tablet Take 1 tablet (10 mg total) by mouth daily before breakfast. 30 tablet 6   furosemide (LASIX) 80 MG tablet Take 1 tablet (80 mg total) by mouth daily. 30 tablet 11   leflunomide (ARAVA) 20 MG tablet TAKE 1 TABLET BY MOUTH EVERY DAY 30 tablet 2   Potassium Chloride ER 20 MEQ TBCR Take 1 tablet by mouth daily. 30 tablet 3   predniSONE (DELTASONE) 5 MG tablet TAKE 1 TABLET BY MOUTH EVERY DAY WITH BREAKFAST 90 tablet 0   rosuvastatin (CRESTOR) 10 MG tablet Take 1 tablet (10 mg total) by mouth daily. 90 tablet 1   No current facility-administered medications for this encounter.    Allergies  Allergen Reactions   Ace Inhibitors Palpitations     Social History   Socioeconomic History   Marital status: Single    Spouse name: Not on file   Number of children: 1   Years of education: Not on file   Highest education level: Not on file  Occupational History   Occupation: Academic librarian   Tobacco Use   Smoking status: Former    Packs/day: 0.50    Types:  Cigarettes    Start date: 2019   Smokeless tobacco: Never  Vaping Use   Vaping Use: Never used  Substance and Sexual Activity   Alcohol use: Yes    Comment: occ   Drug use: No   Sexual activity: Not on file  Other Topics Concern   Not on file  Social History Narrative   Not on file   Social Determinants of Health   Financial Resource Strain: Not on file  Food Insecurity: No Food Insecurity   Worried About Running Out of Food in the Last Year: Never true   [place] in the Last Year: Never true  Transportation Needs: No Transportation Needs   Lack of Transportation (Medical): No   Lack of Transportation (Non-Medical): No  Physical Activity: Not on file  Stress: Not on file  Social Connections: Not on file  Intimate Partner Violence: Not on file     Family History  Problem Relation Age of Onset   Asthma Mother    Diabetes Father    Heart disease Father        Died of presumed heart attack - 8s   Lung disease Sister    Heart disease Sister        Twin sister has heart issue, unclear what kind   Healthy Daughter    Thyroid disease Neg Hx    Colon polyps Neg Hx    Esophageal cancer Neg Hx    Pancreatic cancer Neg Hx    Stomach cancer Neg Hx     Vitals:   08/12/21 1401  BP: 140/78  Pulse: 76  SpO2: 93%  Weight: 94.8 kg (209 lb)   Wt Readings from Last 3 Encounters:  08/12/21 94.8 kg (209 lb)  06/13/21 92.3 kg (203 lb 6.4 oz)  06/06/21 93.5 kg (206 lb 2 oz)    PHYSICAL EXAM: General:  Well appearing. No resp difficulty HEENT: normal Neck: supple. no JVD. Carotids 2+ bilat; no bruits. No lymphadenopathy or thryomegaly appreciated. Cor: PMI nondisplaced. Regular rate & rhythm. + ectopy No rubs, gallops or murmurs. Lungs: clear Abdomen: obese soft, nontender, nondistended. No hepatosplenomegaly. No bruits or masses. Good bowel sounds. Extremities: no cyanosis, clubbing, rash, edema Neuro: alert & orientedx3, cranial nerves grossly intact. moves all 4  extremities w/o difficulty. Affect pleasant  NSR 88 + PVCs. Personally reviewed   ASSESSMENT & PLAN:  1. Chronic biventricular CHF due to NICM (suspect AF cardiomyopathy) - Echo 6/18 EF 15% in setting of tachy CM from AF - Echo 02/2018: EF 40-45%, grade 1 DD, RV normal - Echo 8/20 45-50% Personally reviewed - Echo today EF read as 35-40% (I think closer to 30%) - Amyloid w/u negative with negative fat pad biopsy and PYP scan  - Stable NYHA III-III symptoms  - Volume status looks good  - Continue Jardiance. On lasix 80 daily. Will 80 bid as needed - Off hydralazine due to low BP - BP up today and EF down. Will restart low dose Entresto 24/26 BID - I am worried PVCs may be contributing to drop in EF. Start mexilitene 200 bid  2. Frequent PVCs - last zio 7/22. ~ 7% PVCs but I am worried that it is causing EF to drop again  3. Persistent AF:  Cannot tolerate AF with recurrent shock. S/p DCCV on 03/25/17 (she stopped Amio after this) and DCCV on 05/06/17 and again 05/12/17. - She absolutely cannot tolerate AF with recurrent cardiogenic shock when AF recurs.  - She is now status post AF ablation 10/13/2019. Eliquis stopped due to recurrent GI bleeding. Follows with Dr. Curt Bears - Off amio.   4. Suspected Sleep Apnea: - Refuses sleep study. No change.   5.  COPD:  - No wheeze on exam. No wheezing.  6. CKD IIIb - baseline 1.4-1.9 - Last labs 07/11/21 Scr 1.31 - Sees Dr. Justin Mend.  - Continue Jardiance  7. HTN - Blood pressure mildly elevated. Restart Entresto  8. H/o CVA - off Eliquis due to recurrent GI bleeding  9. H/o diverticular GI bleed in 4/19 - colonoscopy 1/19 with diverticular disease - Tagged NM scan 03/15/18 negative - No evidence of rebleeding - Off Eliquis   Glori Bickers, MD 08/12/21

## 2021-08-13 ENCOUNTER — Telehealth (HOSPITAL_COMMUNITY): Payer: Self-pay | Admitting: Pharmacy Technician

## 2021-08-13 ENCOUNTER — Other Ambulatory Visit (HOSPITAL_COMMUNITY): Payer: Self-pay

## 2021-08-13 NOTE — Telephone Encounter (Signed)
Advanced Heart Failure Patient Advocate Encounter   Received notification from [facility] that prior authorization for Mexiletine is required.   PA submitted on CoverMyMeds Key [id number] Status is pending   Will continue to follow.

## 2021-08-14 ENCOUNTER — Other Ambulatory Visit (HOSPITAL_COMMUNITY): Payer: Self-pay

## 2021-08-14 NOTE — Telephone Encounter (Signed)
Advanced Heart Failure Patient Advocate Encounter  Prior Authorization for Mexiletine has been approved.    PA# [id number] Effective dates: 08/14/21 through 11/23/21  Patients co-pay is $12.38 (30 days)  Charlann Boxer, CPhT

## 2021-08-21 ENCOUNTER — Other Ambulatory Visit: Payer: Self-pay

## 2021-08-21 DIAGNOSIS — R338 Other retention of urine: Secondary | ICD-10-CM | POA: Diagnosis not present

## 2021-08-21 NOTE — Patient Outreach (Signed)
[facility]) Care Management  08/21/2021  Patricia Wagner [birth year] [id number]   Telephone Assessment    Unsuccessful outreach attempt to patient.    Plan: RN CM will send unsuccessful outreach letter to pt. RN CM will make outreach attempt to patient within the month of Nov if no return call.   Enzo Montgomery, RN,BSN,CCM [place] Management Telephonic Care Management Coordinator Direct Phone: [phone] Toll Free: [phone] Fax: [phone]

## 2021-08-22 ENCOUNTER — Ambulatory Visit: Payer: Medicare HMO | Admitting: Cardiology

## 2021-08-23 ENCOUNTER — Other Ambulatory Visit: Payer: Self-pay | Admitting: Physician Assistant

## 2021-08-24 ENCOUNTER — Other Ambulatory Visit (HOSPITAL_COMMUNITY): Payer: Self-pay | Admitting: Internal Medicine

## 2021-08-28 ENCOUNTER — Other Ambulatory Visit: Payer: Self-pay

## 2021-08-28 ENCOUNTER — Ambulatory Visit (INDEPENDENT_AMBULATORY_CARE_PROVIDER_SITE_OTHER): Payer: Medicare HMO | Admitting: Internal Medicine

## 2021-08-28 ENCOUNTER — Ambulatory Visit (INDEPENDENT_AMBULATORY_CARE_PROVIDER_SITE_OTHER): Payer: Medicare HMO

## 2021-08-28 ENCOUNTER — Encounter: Payer: Self-pay | Admitting: Internal Medicine

## 2021-08-28 VITALS — BP 126/78 | HR 82 | Temp 98.1°F | Ht 65.0 in | Wt 210.0 lb

## 2021-08-28 DIAGNOSIS — M810 Age-related osteoporosis without current pathological fracture: Secondary | ICD-10-CM | POA: Diagnosis not present

## 2021-08-28 DIAGNOSIS — J811 Chronic pulmonary edema: Secondary | ICD-10-CM | POA: Diagnosis not present

## 2021-08-28 DIAGNOSIS — J22 Unspecified acute lower respiratory infection: Secondary | ICD-10-CM | POA: Diagnosis not present

## 2021-08-28 DIAGNOSIS — R059 Cough, unspecified: Secondary | ICD-10-CM | POA: Diagnosis not present

## 2021-08-28 DIAGNOSIS — R058 Other specified cough: Secondary | ICD-10-CM

## 2021-08-28 DIAGNOSIS — Z23 Encounter for immunization: Secondary | ICD-10-CM | POA: Diagnosis not present

## 2021-08-28 NOTE — Patient Instructions (Signed)

## 2021-08-28 NOTE — Progress Notes (Signed)
Subjective:  Patient ID: Patricia Wagner, female    DOB: [birth year]  Age: 70 y.o. MRN: [id number]  CC: Cough  This visit occurred during the SARS-CoV-2 public health emergency.  Safety protocols were in place, including screening questions prior to the visit, additional usage of staff PPE, and extensive cleaning of exam room while observing appropriate contact time as indicated for disinfecting solutions.    HPI [name] presents for f/up -   She complains of a 1 week history of cough that is productive of yellow phlegm.  She denies fever, chills, chest pain, shortness of breath, diaphoresis, night sweats, or edema.  Outpatient Medications Prior to Visit  Medication Sig Dispense Refill   Acetaminophen (TYLENOL PO) Take by mouth as needed.      Cholecalciferol (VITAMIN D3) 25 MCG (1000 UT) CAPS Take 1,000 Units by mouth daily.     furosemide (LASIX) 80 MG tablet Take 1 tablet (80 mg total) by mouth daily. Take extra tab in PM as needed 60 tablet 11   JARDIANCE 10 MG TABS tablet TAKE 1 TABLET BY MOUTH DAILY BEFORE BREAKFAST. 30 tablet 3   leflunomide (ARAVA) 20 MG tablet TAKE 1 TABLET BY MOUTH EVERY DAY 30 tablet 2   mexiletine (MEXITIL) 200 MG capsule Take 1 capsule (200 mg total) by mouth 2 (two) times daily. 60 capsule 6   Potassium Chloride ER 20 MEQ TBCR Take 1 tablet by mouth daily. 30 tablet 3   predniSONE (DELTASONE) 5 MG tablet TAKE 1 TABLET BY MOUTH EVERY DAY WITH BREAKFAST 90 tablet 0   rosuvastatin (CRESTOR) 10 MG tablet Take 1 tablet (10 mg total) by mouth daily. 90 tablet 1   sacubitril-valsartan (ENTRESTO) 24-26 MG Take 1 tablet by mouth 2 (two) times daily. 60 tablet 3   No facility-administered medications prior to visit.    ROS Review of Systems  Constitutional:  Negative for chills, diaphoresis, fatigue and fever.  HENT: Negative.  Negative for facial swelling, sinus pressure and sore throat.   Eyes: Negative.   Respiratory:  Positive for cough. Negative for chest  tightness, shortness of breath and wheezing.   Cardiovascular:  Negative for chest pain, palpitations and leg swelling.  Gastrointestinal:  Negative for abdominal pain, diarrhea, nausea and vomiting.  Endocrine: Negative.   Genitourinary: Negative.   Musculoskeletal: Negative.  Negative for arthralgias and myalgias.  Skin: Negative.  Negative for color change and rash.  Neurological: Negative.  Negative for dizziness, weakness and light-headedness.  Hematological:  Negative for adenopathy. Does not bruise/bleed easily.  Psychiatric/Behavioral: Negative.     Objective:  BP 126/78 (BP Location: Left Arm, Patient Position: Sitting, Cuff Size: Large)   Pulse 82   Temp 98.1 F (36.7 C) (Oral)   Ht 5\' 5"  (1.651 m)   Wt 210 lb (95.3 kg)   SpO2 95%   BMI 34.95 kg/m   BP Readings from Last 3 Encounters:  08/28/21 126/78  08/12/21 140/78  06/13/21 (!) 122/58    Wt Readings from Last 3 Encounters:  08/28/21 210 lb (95.3 kg)  08/12/21 209 lb (94.8 kg)  06/13/21 203 lb 6.4 oz (92.3 kg)    Physical Exam Vitals reviewed.  Constitutional:      General: She is not in acute distress.    Appearance: She is not ill-appearing, toxic-appearing or diaphoretic.  HENT:     Nose: Nose normal.     Mouth/Throat:     Mouth: Mucous membranes are moist.  Eyes:  General: No scleral icterus.    Conjunctiva/sclera: Conjunctivae normal.  Cardiovascular:     Rate and Rhythm: Normal rate. Rhythm irregularly irregular.     Heart sounds: Murmur heard.  Systolic murmur is present with a grade of 1/6.  No diastolic murmur is present.    No gallop.  Pulmonary:     Breath sounds: No stridor. No wheezing, rhonchi or rales.  Abdominal:     General: Abdomen is flat.     Palpations: There is no mass.     Tenderness: There is no abdominal tenderness. There is no guarding.     Hernia: No hernia is present.  Musculoskeletal:        General: Normal range of motion.     Cervical back: Neck supple.      Right lower leg: No edema.     Left lower leg: No edema.  Lymphadenopathy:     Cervical: No cervical adenopathy.  Skin:    General: Skin is warm and dry.  Neurological:     General: No focal deficit present.     Mental Status: She is alert.  Psychiatric:        Mood and Affect: Mood normal.    Lab Results  Component Value Date   WBC 6.6 07/11/2021   HGB 12.3 07/11/2021   HCT 40.3 07/11/2021   PLT 231 07/11/2021   GLUCOSE 90 07/11/2021   CHOL 171 05/22/2021   TRIG 128.0 05/22/2021   HDL 69.70 05/22/2021   LDLDIRECT 121.6 03/15/2012   LDLCALC 75 05/22/2021   ALT 9 07/11/2021   AST 12 07/11/2021   NA 141 07/11/2021   K 3.8 07/11/2021   CL 102 07/11/2021   CREATININE 1.31 (H) 07/11/2021   BUN 27 (H) 07/11/2021   CO2 32 07/11/2021   TSH 0.58 05/22/2021   INR 1.0 05/29/2020   HGBA1C 4.7 (L) 03/05/2020    ECHOCARDIOGRAM COMPLETE  Result Date: 08/12/2021    ECHOCARDIOGRAM REPORT   Patient Name:   Patricia Wagner Date of Exam: 08/12/2021 Medical Rec #:  [id number]    Height:       65.0 in Accession #:    [id number]   Weight:       203.4 lb Date of Birth:  [birth year]    BSA:          1.992 m Patient Age:    89 years     BP:           153/100 mmHg Patient Gender: F            HR:           90 bpm. Exam Location:  Inpatient Procedure: 2D Echo, Cardiac Doppler, Color Doppler and 3D Echo Indications:    CHF  History:        Patient has prior history of Echocardiogram examinations, most                 recent 07/19/2019. CHF, CAD, Stroke, Arrythmias:Atrial                 Fibrillation; Risk Factors:Dyslipidemia and Hypertension.  Sonographer:    Merrie Roof RDCS Referring Phys: [place]  1. Left ventricular ejection fraction, by estimation, is 35 to 40%. The left ventricle has moderately decreased function. The left ventricle demonstrates global hypokinesis. The left ventricular internal cavity size was mildly dilated. Left ventricular diastolic parameters are consistent  with Grade I diastolic dysfunction (impaired relaxation).  2. Right  ventricular systolic function is normal. The right ventricular size is normal.  3. Left atrial size was severely dilated.  4. Right atrial size was severely dilated.  5. The mitral valve is normal in structure. Mild to moderate mitral valve regurgitation.  6. The aortic valve is normal in structure. Aortic valve regurgitation is not visualized. No aortic stenosis is present. FINDINGS  Left Ventricle: Left ventricular ejection fraction, by estimation, is 35 to 40%. The left ventricle has moderately decreased function. The left ventricle demonstrates global hypokinesis. The left ventricular internal cavity size was mildly dilated. There is no left ventricular hypertrophy. Left ventricular diastolic parameters are consistent with Grade I diastolic dysfunction (impaired relaxation). Right Ventricle: The right ventricular size is normal. Right vetricular wall thickness was not well visualized. Right ventricular systolic function is normal. Left Atrium: Left atrial size was severely dilated. Right Atrium: Right atrial size was severely dilated. Pericardium: There is no evidence of pericardial effusion. Mitral Valve: The mitral valve is normal in structure. Mild to moderate mitral valve regurgitation. Tricuspid Valve: The tricuspid valve is not well visualized. Tricuspid valve regurgitation is trivial. Aortic Valve: The aortic valve is normal in structure. Aortic valve regurgitation is not visualized. No aortic stenosis is present. Aortic valve mean gradient measures 6.0 mmHg. Aortic valve peak gradient measures 10.0 mmHg. Aortic valve area, by VTI measures 1.18 cm. Pulmonic Valve: The pulmonic valve was grossly normal. Pulmonic valve regurgitation is not visualized. Aorta: The aortic root and ascending aorta are structurally normal, with no evidence of dilitation. IAS/Shunts: The atrial septum is grossly normal.  LEFT VENTRICLE PLAX 2D LVIDd:          6.80 cm LVIDs:         6.00 cm LV PW:         1.10 cm LV IVS:        1.10 cm LVOT diam:     2.00 cm      3D Volume EF: LV SV:         34           3D EF:        38 % LV SV Index:   17           LV EDV:       203 ml LVOT Area:     3.14 cm     LV ESV:       125 ml                             LV SV:        78 ml  LV Volumes (MOD) LV vol d, MOD A2C: 130.0 ml LV vol d, MOD A4C: 152.0 ml LV vol s, MOD A2C: 90.1 ml LV vol s, MOD A4C: 92.3 ml LV SV MOD A2C:     39.9 ml LV SV MOD A4C:     152.0 ml LV SV MOD BP:      52.0 ml RIGHT VENTRICLE RV Basal diam:  5.10 cm RV Mid diam:    3.30 cm LEFT ATRIUM              Index       RIGHT ATRIUM           Index LA diam:        5.70 cm  2.86 cm/m  RA Area:     26.40 cm LA Vol (A2C):   84.9 ml  42.61 ml/m RA Volume:   82.10 ml  41.21 ml/m LA Vol (A4C):   117.0 ml 58.72 ml/m LA Biplane Vol: 101.0 ml 50.69 ml/m  AORTIC VALVE AV Area (Vmax):    1.28 cm AV Area (Vmean):   1.18 cm AV Area (VTI):     1.18 cm AV Vmax:           158.00 cm/s AV Vmean:          115.000 cm/s AV VTI:            0.290 m AV Peak Grad:      10.0 mmHg AV Mean Grad:      6.0 mmHg LVOT Vmax:         64.60 cm/s LVOT Vmean:        43.300 cm/s LVOT VTI:          0.109 m LVOT/AV VTI ratio: 0.38  AORTA Ao Root diam: 3.10 cm Ao Asc diam:  3.80 cm  SHUNTS Systemic VTI:  0.11 m Systemic Diam: 2.00 cm Mertie Moores MD Electronically signed by Mertie Moores MD Signature Date/Time: 08/12/2021/2:29:00 PM    Final    EXAM: DUAL X-RAY ABSORPTIOMETRY (DXA) FOR BONE MINERAL DENSITY   TECHNIQUE: Bone mineral density measurements are performed of the spine, hip, and forearm, as appropriate, per International Society of Clinical Densitometry recommendations. The pertinent regions of interest are reported below. Non-contributory values are not reported. Images are obtained for bone mineral density measurement and are not obtained for diagnostic purposes.   FINDINGS: LEFT FOREARM (1/3 RADIUS)   Bone Mineral Density  (BMD):  0.450   Young Adult T Score:  -4.1   Z Score:  -2.2   LEFT FEMUR TOTAL   Bone Mineral Density (BMD):  0.635 g/cm2   Young Adult T-Score: -2.5   Z-Score:  -1.5   Unit: This study was performed at [facility] on the [place] (S/N [phone]), software version 13.4.2.   Scan quality: The scan quality is good. Exclusions: Lumbar spine excluded due to degenerative change.   ASSESSMENT: Patient's diagnostic category is OSTEOPOROSIS by WHO Criteria.   FRACTURE RISK: INCREASED   FRAX: World Health Organization FRAX assessment of absolute fracture risk is not calculated for this patient because the patient has osteoporosis.   COMPARISON: None.   RECOMMENDATIONS   1. All patients should optimize calcium and vitamin D intake.   2. Consider FDA-approved medical therapies in postmenopausal women and men aged 75 years and older, based on the following:   - A hip or vertebral (clinical or morphometric) fracture  DG Chest 2 View  Result Date: 08/29/2021 CLINICAL DATA:  Cough for several months, history hypertension, former smoker, chronic atrial fibrillation, CHF, non ischemic cardiomyopathy, stage III chronic kidney disease EXAM: CHEST - 2 VIEW COMPARISON:  08/08/2018 FINDINGS: Enlargement of cardiac silhouette with pulmonary vascular congestion. Atherosclerotic calcification aorta. Lungs clear. No pulmonary infiltrate, pleural effusion, or pneumothorax. Osseous structures unremarkable. IMPRESSION: Enlargement of cardiac silhouette with pulmonary vascular congestion. No acute infiltrate. Aortic Atherosclerosis (ICD10-I70.0). Electronically Signed   By: Lavonia Dana M.D.   On: 08/29/2021 17:11     Assessment & Plan:   Tailor was seen today for cough.  Diagnoses and all orders for this visit:  Age-related osteoporosis without current pathological fracture- She agrees to start the anabolic agent Evenity.  Flu vaccine need -     Flu Vaccine QUAD High  Dose(Fluad)  Cough productive of purulent sputum- Her chest x-ray is negative  for mass or infiltrate. -     DG Chest 2 View; Future  LRTI (lower respiratory tract infection)- Will treat the infection with a broad-spectrum cephalosporin antibiotic. -     cefdinir (OMNICEF) 300 MG capsule; Take 1 capsule (300 mg total) by mouth 2 (two) times daily for 10 days.  I am having Ahniyah Giancola. Barkdull start on cefdinir. I am also having her maintain her Vitamin D3, Acetaminophen (TYLENOL PO), Potassium Chloride ER, rosuvastatin, predniSONE, leflunomide, furosemide, Entresto, mexiletine, and Jardiance.  Meds ordered this encounter  Medications   cefdinir (OMNICEF) 300 MG capsule    Sig: Take 1 capsule (300 mg total) by mouth 2 (two) times daily for 10 days.    Dispense:  20 capsule    Refill:  0      Follow-up: Return in about 3 months (around 11/28/2021).  Scarlette Calico, MD

## 2021-08-29 DIAGNOSIS — J22 Unspecified acute lower respiratory infection: Secondary | ICD-10-CM | POA: Insufficient documentation

## 2021-08-29 MED ORDER — CEFDINIR 300 MG PO CAPS
300.0000 mg | ORAL_CAPSULE | Freq: Two times a day (BID) | ORAL | 0 refills | Status: AC
Start: 2021-08-29 — End: 2021-09-08

## 2021-09-05 ENCOUNTER — Telehealth: Payer: Self-pay

## 2021-09-05 NOTE — Telephone Encounter (Signed)
Needs Evenity VOB 

## 2021-09-05 NOTE — Telephone Encounter (Signed)
Prolia VOB initiated via [email or web address]  Last OV:  Next OV:  Last Prolia inj:  Next Prolia inj DUE:   

## 2021-09-11 NOTE — Telephone Encounter (Signed)
Prior Auth required for [employer]  PA PROCESS DETAILS: PA is required. PA can be initiated by calling [phone] or online at [email or web address]

## 2021-09-18 DIAGNOSIS — N312 Flaccid neuropathic bladder, not elsewhere classified: Secondary | ICD-10-CM | POA: Diagnosis not present

## 2021-09-20 NOTE — Telephone Encounter (Signed)
Approvedon October 23 PA Case: [id number], Status: Approved, Coverage Starts on: 09/16/2021 12:00:00 AM, Coverage Ends on: 09/16/2022 12:00:00 AM. Questions? Contact [phone].

## 2021-09-25 ENCOUNTER — Other Ambulatory Visit (HOSPITAL_COMMUNITY): Payer: Self-pay | Admitting: Internal Medicine

## 2021-09-25 NOTE — Telephone Encounter (Signed)
.  Pt ready for scheduling on or after 09/26/21  Out-of-pocket cost due at time of visit: $415  Primary: [employer] Evenity co-insurance: 20% (approximately $390) Admin fee co-insurance: 20% (approximately $25)  Secondary: n/a Evenity co-insurance:  Admin fee co-insurance:   Deductible: does no apply  Prior Auth: APPROVED PA# [id number] Valid: 09/16/21-09/16/22   ** This summary of benefits is an estimation of the patient's out-of-pocket cost. Exact cost may vary based on individual plan coverage.

## 2021-09-27 ENCOUNTER — Other Ambulatory Visit (HOSPITAL_COMMUNITY): Payer: Self-pay | Admitting: *Deleted

## 2021-09-27 NOTE — Telephone Encounter (Signed)
Pt left vm requesting refill but did not say which medication. I called pt to get medication name and no answer/no vm set up.

## 2021-09-30 ENCOUNTER — Other Ambulatory Visit: Payer: Self-pay

## 2021-09-30 ENCOUNTER — Encounter: Payer: Self-pay | Admitting: Cardiology

## 2021-09-30 ENCOUNTER — Ambulatory Visit: Payer: Medicare HMO | Admitting: Cardiology

## 2021-09-30 VITALS — BP 120/80 | HR 62 | Ht 64.0 in | Wt 215.4 lb

## 2021-09-30 DIAGNOSIS — I4819 Other persistent atrial fibrillation: Secondary | ICD-10-CM

## 2021-09-30 NOTE — Progress Notes (Signed)
Electrophysiology Office Note   Date:  09/30/2021   ID:  Patricia Wagner, DOB 08-25-1951, MRN [id number]  PCP:  Janith Lima, MD  Cardiologist:  [place] Primary Electrophysiologist:  Masiyah Jorstad Meredith Leeds, MD    No chief complaint on file.    History of Present Illness: Patricia Wagner is a 70 y.o. female who is being seen today for the evaluation of CHF, atrial fibrillation at the request of Janith Lima, MD. Presenting today for electrophysiology evaluation.   She has a history significant for persistent atrial fibrillation, nonischemic cardiomyopathy, hypertension, hyperlipidemia.  She was diagnosed with atrial fibrillation December 2017.  She had multiple cardioversions and has been loaded on amiodarone.  She presented to hospital June 2018 with cardiogenic shock in the setting of persistent atrial fibrillation.  Ejection fraction was found to be 15% on amiodarone which was again reloaded.  She is now status post atrial fibrillation ablation 10/13/2019.  Amiodarone has since been stopped.  Today, denies symptoms of palpitations, chest pain, shortness of breath, orthopnea, PND, lower extremity edema, claudication, dizziness, presyncope, syncope, bleeding, or neurologic sequela. The patient is tolerating medications without difficulties.  She is currently feeling well.  She has no chest pain or shortness of breath.  She has been gaining some weight, which she is not trying to do.  She comes in on a walker today.  She is able to do all of her daily activities without restriction.   Past Medical History:  Diagnosis Date   Acute blood loss anemia 03/05/2018   Acute hypercapnic respiratory failure ([place]) 04/30/2017   Acute renal failure superimposed on chronic kidney disease ([place]) 10/20/2016   Arthritis    hands, knees   Atrial fibrillation ([place])    a. s/p multiple cardioversions; failed tikosyn/sotalol.   Bradycardia 11/20/2016   CAD in native artery, s/p cardiac cath with non  obstructive CAD 10/24/2016   Cerebrovascular accident (CVA) due to embolism of left cerebellar artery (HCC)    CHF (congestive heart failure) ([place])    Chronic anticoagulation 03/15/2018   Chronic atrial fibrillation ([place]) 3/81/0175   Chronic systolic (congestive) heart failure ([place]) 03/05/2018   CKD (chronic kidney disease) stage 3, GFR 30-59 ml/min (HCC)    Coagulopathy ([place]) 03/05/2018   DIVERTICULITIS, HX OF 07/25/2007   Diverticulosis of colon with hemorrhage 07/22/2007   Qualifier: Diagnosis of  By: [name]     DIVERTICULOSIS, COLON 07/22/2007   Dysuria 07/21/2017   Edema, peripheral    a. chronic BLE edema, R>L. Prior trauma from dog attack and accident.   Essential hypertension 07/22/2007   Qualifier: Diagnosis of  By: [name]     History of kidney stones    HLD (hyperlipidemia) 02/03/2008   Qualifier: Diagnosis of  By: [name], James W    HYPERLIPIDEMIA 02/03/2008   Hypersomnia    declines w/u   Hypertension    Hypotension (arterial) 04/30/2017   MENOPAUSAL DISORDER 01/09/2011   Morbid obesity ([place]) 07/22/2007   NICM (nonischemic cardiomyopathy) ([place]) 10/24/2016   PAF (paroxysmal atrial fibrillation) ([place])    Presence of indwelling urinary catheter    supra pubic cath   Raynaud's syndrome 07/22/2007   Stroke ([place]) 2017   Suspected sleep apnea 05/20/2017   THYROID NODULE, RIGHT 01/04/2010   VITAMIN D DEFICIENCY 01/09/2011   Qualifier: Diagnosis of  By: [name], [name]    Past Surgical History:  Procedure Laterality Date   ATRIAL FIBRILLATION ABLATION N/A 10/13/2019   Procedure: ATRIAL  FIBRILLATION ABLATION;  Surgeon: [name], MD;  Location: [place] [facility];  Service: Cardiovascular;  Laterality: N/A;   CARDIAC CATHETERIZATION N/A 10/23/2016   Procedure: Left Heart Cath and Coronary Angiography;  Surgeon: [name], MD;  Location: [place] [facility];  Service: Cardiovascular;  Laterality: N/A;   CARDIOVERSION N/A 10/31/2016   Procedure:  CARDIOVERSION;  Surgeon: [name], MD;  Location: [place];  Service: Cardiovascular;  Laterality: N/A;   CARDIOVERSION N/A 11/03/2016   Procedure: CARDIOVERSION;  Surgeon: [name], MD;  Location: [place];  Service: Cardiovascular;  Laterality: N/A;   CARDIOVERSION N/A 11/18/2016   Procedure: CARDIOVERSION;  Surgeon: [name], MD;  Location: [place];  Service: Cardiovascular;  Laterality: N/A;   CARDIOVERSION N/A 03/25/2017   Procedure: CARDIOVERSION;  Surgeon: [name], MD;  Location: [facility] ENDOSCOPY;  Service: Cardiovascular;  Laterality: N/A;   CARDIOVERSION N/A 05/06/2017   Procedure: CARDIOVERSION;  Surgeon: [name], MD;  Location: [facility] ENDOSCOPY;  Service: Cardiovascular;  Laterality: N/A;   CARDIOVERSION N/A 05/12/2017   Procedure: CARDIOVERSION;  Surgeon: [name], MD;  Location: [facility] ENDOSCOPY;  Service: Cardiovascular;  Laterality: N/A;   COLONOSCOPY N/A 12/04/2017   Procedure: COLONOSCOPY;  Surgeon: [name], MD;  Location: [place];  Service: Endoscopy;  Laterality: N/A;   COLONOSCOPY     COLONOSCOPY W/ POLYPECTOMY  02/2011   pan diverticulosis.  tubular adenoma without dysplasia on 5 mm sigmoid polyp.  Dr Fuller Plan.     ESOPHAGOGASTRODUODENOSCOPY (EGD) WITH PROPOFOL N/A 05/16/2020   Procedure: ESOPHAGOGASTRODUODENOSCOPY (EGD) WITH PROPOFOL;  Surgeon: Milus Banister, MD;  Location: [facility] ENDOSCOPY;  Service: Endoscopy;  Laterality: N/A;   FLEXIBLE SIGMOIDOSCOPY N/A 01/18/2020   Procedure: FLEXIBLE SIGMOIDOSCOPY;  Surgeon: [name], DO;  Location: [place];  Service: Gastroenterology;  Laterality: N/A;   HEMOSTASIS CLIP PLACEMENT  01/18/2020   Procedure: HEMOSTASIS CLIP PLACEMENT;  Surgeon: [name], DO;  Location: [place];  Service: Gastroenterology;;   HOT HEMOSTASIS N/A 05/16/2020   Procedure: HOT HEMOSTASIS (ARGON PLASMA COAGULATION/BICAP);  Surgeon: Milus Banister, MD;  Location: [facility];   Service: Endoscopy;  Laterality: N/A;   PARTIAL HYSTERECTOMY     1 OVARY LEFT   SCLEROTHERAPY  01/18/2020   Procedure: SCLEROTHERAPY;  Surgeon: [name], DO;  Location: [place] ENDOSCOPY;  Service: Gastroenterology;;   TEE WITHOUT CARDIOVERSION N/A 10/31/2016   Procedure: TRANSESOPHAGEAL ECHOCARDIOGRAM (TEE);  Surgeon: [name], MD;  Location: [facility] ENDOSCOPY;  Service: Cardiovascular;  Laterality: N/A;   TEE WITHOUT CARDIOVERSION N/A 03/25/2017   Procedure: TRANSESOPHAGEAL ECHOCARDIOGRAM (TEE);  Surgeon: [name], MD;  Location: [facility] ENDOSCOPY;  Service: Cardiovascular;  Laterality: N/A;   TOTAL KNEE ARTHROPLASTY Right 04/10/2020   Procedure: TOTAL KNEE ARTHROPLASTY;  Surgeon: Renette Butters, MD;  Location: [facility] ORS;  Service: Orthopedics;  Laterality: Right;     Current Outpatient Medications  Medication Sig Dispense Refill   Acetaminophen (TYLENOL PO) Take by mouth as needed.      Cholecalciferol (VITAMIN D3) 25 MCG (1000 UT) CAPS Take 1,000 Units by mouth daily.     furosemide (LASIX) 80 MG tablet Take 1 tablet (80 mg total) by mouth daily. Take extra tab in PM as needed 60 tablet 11   JARDIANCE 10 MG TABS tablet TAKE 1 TABLET BY MOUTH DAILY BEFORE BREAKFAST. 30 tablet 3   leflunomide (ARAVA) 20 MG tablet TAKE 1 TABLET BY MOUTH EVERY DAY 30 tablet 2  mexiletine (MEXITIL) 200 MG capsule Take 1 capsule (200 mg total) by mouth 2 (two) times daily. 60 capsule 6   Potassium Chloride ER 20 MEQ TBCR TAKE 1 TABLET BY MOUTH EVERY DAY 90 tablet 3   predniSONE (DELTASONE) 5 MG tablet TAKE 1 TABLET BY MOUTH EVERY DAY WITH BREAKFAST 90 tablet 0   rosuvastatin (CRESTOR) 10 MG tablet Take 1 tablet (10 mg total) by mouth daily. 90 tablet 1   sacubitril-valsartan (ENTRESTO) 24-26 MG Take 1 tablet by mouth 2 (two) times daily. 60 tablet 3   No current facility-administered medications for this visit.    Allergies:   Ace inhibitors   Social History:  The patient  reports that she has quit  smoking. Her smoking use included cigarettes. She started smoking about 3 years ago. She smoked an average of .5 packs per day. She has never used smokeless tobacco. She reports current alcohol use. She reports that she does not use drugs.   Family History:  The patient's family history includes Asthma in her mother; Diabetes in her father; Healthy in her daughter; Heart disease in her father and sister; Lung disease in her sister.   ROS:  Please see the history of present illness.   Otherwise, review of systems is positive for none.   All other systems are reviewed and negative.   PHYSICAL EXAM: VS:  BP 120/80   Pulse 62   Ht 5\' 4"  (1.626 m)   Wt 215 lb 6.4 oz (97.7 kg)   SpO2 96%   BMI 36.97 kg/m  , BMI Body mass index is 36.97 kg/m. GEN: Well nourished, well developed, in no acute distress  HEENT: normal  Neck: no JVD, carotid bruits, or masses Cardiac: RRR; no murmurs, rubs, or gallops,no edema  Respiratory:  clear to auscultation bilaterally, normal work of breathing GI: soft, nontender, nondistended, + BS MS: no deformity or atrophy  Skin: warm and dry Neuro:  Strength and sensation are intact Psych: euthymic mood, full affect  EKG:  EKG is not ordered today. Personal review of the ekg ordered 08/12/21 shows sinus rhythm, PVCs  Recent Labs: 05/22/2021: TSH 0.58 07/11/2021: ALT 9; BUN 27; Creat 1.31; Hemoglobin 12.3; Platelets 231; Potassium 3.8; Sodium 141    Lipid Panel     Component Value Date/Time   CHOL 171 05/22/2021 1435   CHOL 136 01/24/2020 1216   TRIG 128.0 05/22/2021 1435   HDL 69.70 05/22/2021 1435   HDL 71 01/24/2020 1216   CHOLHDL 2 05/22/2021 1435   VLDL 25.6 05/22/2021 1435   LDLCALC 75 05/22/2021 1435   LDLCALC 51 01/24/2020 1216   LDLDIRECT 121.6 03/15/2012 1646     Wt Readings from Last 3 Encounters:  09/30/21 215 lb 6.4 oz (97.7 kg)  08/28/21 210 lb (95.3 kg)  08/12/21 209 lb (94.8 kg)      Other studies Reviewed: Additional studies/  records that were reviewed today include: TTE 08/12/21 Review of the above records today demonstrates:   1. Left ventricular ejection fraction, by estimation, is 35 to 40%. The  left ventricle has moderately decreased function. The left ventricle  demonstrates global hypokinesis. The left ventricular internal cavity size  was mildly dilated. Left ventricular  diastolic parameters are consistent with Grade I diastolic dysfunction  (impaired relaxation).   2. Right ventricular systolic function is normal. The right ventricular  size is normal.   3. Left atrial size was severely dilated.   4. Right atrial size was severely dilated.  5. The mitral valve is normal in structure. Mild to moderate mitral valve  regurgitation.   6. The aortic valve is normal in structure. Aortic valve regurgitation is  not visualized. No aortic stenosis is present.  LHC 10/23/16 Mild, non-obstructive coronary artery disease consistent with non-ischemic cardiomyopathy. Mildly elevated left ventricular filling pressure.   ASSESSMENT AND PLAN:  1.  Persistent atrial fibrillation:  CHA2DS2-VASc 3.  Status post ablation 10/13/2019.  Amiodarone has since been stopped.  She has been taken off of Eliquis due to GI bleeding.  Remains in sinus rhythm.  No changes at this time.  2.  Chronic systolic heart failure due to nonischemic cardiomyopathy: Potentially tachycardia mediated cardiomyopathy due to atrial fibrillation.  She has an ejection fraction of 40 to 45%.  Plan per primary cardiology.  3.  PVCs: 7% on most recent cardiac monitor.  Currently on mexiletine 200 mg twice daily.  High risk medication monitoring ECG.  Current medicines are reviewed at length with the patient today.   The patient does not have concerns regarding her medicines.  The following changes were made today: None  Labs/ tests ordered today include:  No orders of the defined types were placed in this encounter.    Disposition:   FU with  Damary Doland 6 months  Signed, Khristopher Kapaun Meredith Leeds, MD  09/30/2021 3:13 PM     [address] [id number] [phone] (office) [phone] (fax)

## 2021-09-30 NOTE — Patient Instructions (Signed)
Medication Instructions:  °Your physician recommends that you continue on your current medications as directed. Please refer to the Current Medication list given to you today. ° °*If you need a refill on your cardiac medications before your next appointment, please call your pharmacy* ° ° °Lab Work: °None ordered ° ° °Testing/Procedures: °None ordered ° ° °Follow-Up: °At [facility], you and your health needs are our priority.  As part of our continuing mission to provide you with exceptional heart care, we have created designated Provider Care Teams.  These Care Teams include your primary Cardiologist (physician) and Advanced Practice Providers (APPs -  Physician Assistants and Nurse Practitioners) who all work together to provide you with the care you need, when you need it. ° °We recommend signing up for the patient portal called "MyChart".  Sign up information is provided on this After Visit Summary.  MyChart is used to connect with patients for Virtual Visits (Telemedicine).  Patients are able to view lab/test results, encounter notes, upcoming appointments, etc.  Non-urgent messages can be sent to your provider as well.   °To learn more about what you can do with MyChart, go to [email or web address].   ° °Your next appointment:   °6 month(s) ° °The format for your next appointment:   °In Person ° °Provider:   °You will see one of the following Advanced Practice Providers on your designated Care Team:   °Renee Ursuy, PA-C °Michael "Andy" Tillery, PA-C ° ° ° °Thank you for choosing [facility]!! ° ° °Tylerjames Hoglund, RN °[phone] ° °

## 2021-10-01 NOTE — Progress Notes (Signed)
Office Visit Note  Patient: Patricia Wagner             Date of Birth: [birth year]           MRN: [id number]             PCP: Janith Lima, MD Referring: Janith Lima, MD Visit Date: 10/15/2021 Occupation: _0 @  Subjective:  Left knee joint pain   History of Present Illness: Patricia Wagner is a 70 y.o. female with history of seropositive rheumatoid arthritis.  She is taking Arava 20 mg 1 tablet by mouth daily and prednisone 5 mg daily. She is tolerating both medications without any side effects.  She has not missed any doses of Arava or prednisone recently.  Patient reports that she experiences intermittent pain in the right hand as well as left knee joint.  She states that she noticed some improvement after the first Visco gel injection she received on 05/28/2021 but did not notice any improvement after completing the series.  She is planning on following up with the orthopedist to discuss scheduling a left knee replacement in the future.    Activities of Daily Living:  Patient reports morning stiffness for 0  unable to answer question .   Patient Denies nocturnal pain.  Difficulty dressing/grooming: Denies Difficulty climbing stairs: Reports Difficulty getting out of chair: Denies Difficulty using hands for taps, buttons, cutlery, and/or writing: Denies  Review of Systems  Constitutional:  Positive for fatigue.  HENT:  Negative for mouth dryness.   Eyes:  Negative for dryness.  Respiratory:  Negative for shortness of breath.   Cardiovascular:  Negative for swelling in legs/feet.  Gastrointestinal:  Positive for diarrhea.  Endocrine: Negative for excessive thirst.  Genitourinary:  Negative for difficulty urinating.  Musculoskeletal:  Negative for joint pain and joint pain.  Skin:  Negative for rash.  Allergic/Immunologic: Negative for susceptible to infections.  Neurological:  Negative for numbness.  Hematological:  Negative for bruising/bleeding tendency.   Psychiatric/Behavioral:  Negative for sleep disturbance.    PMFS History:  Patient Active Problem List   Diagnosis Date Noted   LRTI (lower respiratory tract infection) 08/29/2021   Age-related osteoporosis without current pathological fracture 08/28/2021   Cough productive of purulent sputum 08/28/2021   CKD (chronic kidney disease) stage 3, GFR 30-59 ml/min (HCC)    Visit for screening mammogram 05/22/2021   Steroid dependence ([place]) 05/22/2021   Estrogen deficiency 05/22/2021   Encounter for general adult medical examination with abnormal findings 05/22/2021   Dyslipidemia, goal LDL below 130 05/22/2021   Primary osteoarthritis of right knee 03/20/2020   Persistent atrial fibrillation ([place]) 10/13/2019   Allergic rhinitis 09/10/2019   Vitamin D deficiency 04/15/2019   Acute renal failure superimposed on stage 4 chronic kidney disease ([place]) 27/25/3664   Chronic systolic (congestive) heart failure ([place]) 03/05/2018   Chronic atrial fibrillation 03/05/2018   Urinary retention 11/23/2017   Bradycardia 11/20/2016   Chronic combined systolic (congestive) and diastolic (congestive) heart failure ([place]) 11/20/2016   Cerebrovascular accident (CVA) due to embolism of left cerebellar artery (HCC)    PAF (paroxysmal atrial fibrillation) (HCC)    CHF (congestive heart failure) ([place]) 10/31/2016   CAD in native artery, s/p cardiac cath with non obstructive CAD 10/24/2016   NICM (nonischemic cardiomyopathy) ([place]) 10/24/2016   Tobacco abuse 10/20/2016   Degenerative arthritis of left knee 03/15/2012   Encounter for well adult exam with abnormal findings 03/12/2012   Hypersomnia    MENOPAUSAL DISORDER  01/09/2011   HLD (hyperlipidemia) 02/03/2008   Essential hypertension 07/22/2007   Raynaud's syndrome 07/22/2007    Past Medical History:  Diagnosis Date   Acute blood loss anemia 03/05/2018   Acute hypercapnic respiratory failure ([place]) 04/30/2017   Acute renal failure superimposed on chronic  kidney disease ([place]) 10/20/2016   Arthritis    hands, knees   Atrial fibrillation ([place])    a. s/p multiple cardioversions; failed tikosyn/sotalol.   Bradycardia 11/20/2016   CAD in native artery, s/p cardiac cath with non obstructive CAD 10/24/2016   Cerebrovascular accident (CVA) due to embolism of left cerebellar artery (HCC)    CHF (congestive heart failure) ([place])    Chronic anticoagulation 03/15/2018   Chronic atrial fibrillation ([place]) 2/70/3500   Chronic systolic (congestive) heart failure ([place]) 03/05/2018   CKD (chronic kidney disease) stage 3, GFR 30-59 ml/min (HCC)    Coagulopathy ([place]) 03/05/2018   DIVERTICULITIS, HX OF 07/25/2007   Diverticulosis of colon with hemorrhage 07/22/2007   Qualifier: Diagnosis of  By: [name]     DIVERTICULOSIS, COLON 07/22/2007   Dysuria 07/21/2017   Edema, peripheral    a. chronic BLE edema, R>L. Prior trauma from dog attack and accident.   Essential hypertension 07/22/2007   Qualifier: Diagnosis of  By: [name]     History of kidney stones    HLD (hyperlipidemia) 02/03/2008   Qualifier: Diagnosis of  By: [name], James W    HYPERLIPIDEMIA 02/03/2008   Hypersomnia    declines w/u   Hypertension    Hypotension (arterial) 04/30/2017   MENOPAUSAL DISORDER 01/09/2011   Morbid obesity ([place]) 07/22/2007   NICM (nonischemic cardiomyopathy) ([place]) 10/24/2016   PAF (paroxysmal atrial fibrillation) ([place])    Presence of indwelling urinary catheter    supra pubic cath   Raynaud's syndrome 07/22/2007   Stroke ([place]) 2017   Suspected sleep apnea 05/20/2017   THYROID NODULE, RIGHT 01/04/2010   VITAMIN D DEFICIENCY 01/09/2011   Qualifier: Diagnosis of  By: [name], [name]     Family History  Problem Relation Age of Onset   Asthma Mother    Diabetes Father    Heart disease Father        Died of presumed heart attack - 86s   Lung disease Sister    Heart disease Sister        Twin sister has heart issue, unclear what kind   Healthy Daughter     Thyroid disease Neg Hx    Colon polyps Neg Hx    Esophageal cancer Neg Hx    Pancreatic cancer Neg Hx    Stomach cancer Neg Hx    Past Surgical History:  Procedure Laterality Date   ATRIAL FIBRILLATION ABLATION N/A 10/13/2019   Procedure: ATRIAL FIBRILLATION ABLATION;  Surgeon: [name], MD;  Location: [facility];  Service: Cardiovascular;  Laterality: N/A;   CARDIAC CATHETERIZATION N/A 10/23/2016   Procedure: Left Heart Cath and Coronary Angiography;  Surgeon: [name], MD;  Location: [facility];  Service: Cardiovascular;  Laterality: N/A;   CARDIOVERSION N/A 10/31/2016   Procedure: CARDIOVERSION;  Surgeon: [name], MD;  Location: [facility] ENDOSCOPY;  Service: Cardiovascular;  Laterality: N/A;   CARDIOVERSION N/A 11/03/2016   Procedure: CARDIOVERSION;  Surgeon: [name], MD;  Location: [facility];  Service: Cardiovascular;  Laterality: N/A;   CARDIOVERSION N/A 11/18/2016   Procedure: CARDIOVERSION;  Surgeon: [name], MD;  Location: [facility];  Service: Cardiovascular;  Laterality:  N/A;   CARDIOVERSION N/A 03/25/2017   Procedure: CARDIOVERSION;  Surgeon: [name], MD;  Location: [facility]COPY;  Service: Cardiovascular;  Laterality: N/A;   CARDIOVERSION N/A 05/06/2017   Procedure: CARDIOVERSION;  Surgeon: [name], MD;  Location: [facility] ENDOSCOPY;  Service: Cardiovascular;  Laterality: N/A;   CARDIOVERSION N/A 05/12/2017   Procedure: CARDIOVERSION;  Surgeon: [name], MD;  Location: [facility] ENDOSCOPY;  Service: Cardiovascular;  Laterality: N/A;   CHOLECYSTECTOMY     COLONOSCOPY N/A 12/04/2017   Procedure: COLONOSCOPY;  Surgeon: [name], MD;  Location: [facility] ENDOSCOPY;  Service: Endoscopy;  Laterality: N/A;   COLONOSCOPY     COLONOSCOPY W/ POLYPECTOMY  02/2011   pan diverticulosis.  tubular adenoma without dysplasia on 5 mm sigmoid polyp.  Dr Fuller Plan.     ESOPHAGOGASTRODUODENOSCOPY (EGD) WITH PROPOFOL N/A  05/16/2020   Procedure: ESOPHAGOGASTRODUODENOSCOPY (EGD) WITH PROPOFOL;  Surgeon: Milus Banister, MD;  Location: [facility] ENDOSCOPY;  Service: Endoscopy;  Laterality: N/A;   FLEXIBLE SIGMOIDOSCOPY N/A 01/18/2020   Procedure: FLEXIBLE SIGMOIDOSCOPY;  Surgeon: [name], DO;  Location: [place];  Service: Gastroenterology;  Laterality: N/A;   HEMOSTASIS CLIP PLACEMENT  01/18/2020   Procedure: HEMOSTASIS CLIP PLACEMENT;  Surgeon: [name], DO;  Location: [facility];  Service: Gastroenterology;;   HOT HEMOSTASIS N/A 05/16/2020   Procedure: HOT HEMOSTASIS (ARGON PLASMA COAGULATION/BICAP);  Surgeon: Milus Banister, MD;  Location: [facility];  Service: Endoscopy;  Laterality: N/A;   PARTIAL HYSTERECTOMY     1 OVARY LEFT   SCLEROTHERAPY  01/18/2020   Procedure: SCLEROTHERAPY;  Surgeon: [name], DO;  Location: [facility]COPY;  Service: Gastroenterology;;   TEE WITHOUT CARDIOVERSION N/A 10/31/2016   Procedure: TRANSESOPHAGEAL ECHOCARDIOGRAM (TEE);  Surgeon: [name], MD;  Location: [facility] ENDOSCOPY;  Service: Cardiovascular;  Laterality: N/A;   TEE WITHOUT CARDIOVERSION N/A 03/25/2017   Procedure: TRANSESOPHAGEAL ECHOCARDIOGRAM (TEE);  Surgeon: [name], MD;  Location: [facility] ENDOSCOPY;  Service: Cardiovascular;  Laterality: N/A;   TOTAL KNEE ARTHROPLASTY Right 04/10/2020   Procedure: TOTAL KNEE ARTHROPLASTY;  Surgeon: Renette Butters, MD;  Location: [facility]S;  Service: Orthopedics;  Laterality: Right;   Social History   Social History Narrative   Not on file   Immunization History  Administered Date(s) Administered   Fluad Quad(high Dose 65+) 09/07/2019, 09/17/2020, 08/28/2021   Influenza Whole 01/09/2011   Influenza, High Dose Seasonal PF 08/30/2018   Influenza,inj,Quad PF,6+ Mos 10/25/2015, 10/24/2016, 07/21/2017   PFIZER Comirnaty(Gray Top)Covid-19 Tri-Sucrose Vaccine 12/17/2020, 05/14/2021   PFIZER(Purple Top)SARS-COV-2 Vaccination 04/28/2020,  05/24/2020   Pneumococcal Conjugate-13 04/15/2019   Pneumococcal Polysaccharide-23 10/24/2016, 03/18/2018   Td 02/02/2009   Tdap 04/15/2019     Objective: Vital Signs: BP 101/71 (BP Location: Left Arm, Patient Position: Sitting, Cuff Size: Normal)   Pulse (!) 102   Resp 17   Ht _0  (1.626 m)   Wt 218 lb (98.9 kg)   BMI 37.42 kg/m    Physical Exam Vitals and nursing note reviewed.  Constitutional:      Appearance: She is well-developed.  HENT:     Head: Normocephalic and atraumatic.  Eyes:     Conjunctiva/sclera: Conjunctivae normal.  Pulmonary:     Effort: Pulmonary effort is normal.  Abdominal:     Palpations: Abdomen is soft.  Musculoskeletal:     Cervical back: Normal range of motion.  Skin:    General: Skin is warm and dry.     Capillary Refill: Capillary refill takes  less than 2 seconds.  Neurological:     Mental Status: She is alert and oriented to person, place, and time.  Psychiatric:        Behavior: Behavior normal.     Musculoskeletal Exam: C-spine has good range of motion with no discomfort.  Shoulder joints, elbow joints, wrist joints, MCPs, PIPs, DIPs have good range of motion with no synovitis.  Synovial thickening over MCP joints but no synovitis or tenderness noted.  PIP and DIP prominence consistent with osteoarthritis of both hands.  2 small rheumatoid nodules noted on the extensor surface of the right elbow as well as over the left first MCP joint.  Hip joints have good range of motion with no discomfort.  No tenderness over trochanteric bursa bilaterally.  Right knee replacement has good range of motion with no discomfort.  Left knee joint has slightly limited extension with no effusion.  Ankle joints have good range of motion with no tenderness or joint swelling.  CDAI Exam: CDAI Score: 0.6  Patient Global: 3 mm; Provider Global: 3 mm Swollen: 0 ; Tender: 0  Joint Exam 10/15/2021   No joint exam has been documented for this visit   There is  currently no information documented on the homunculus. Go to the Rheumatology activity and complete the homunculus joint exam.  Investigation: No additional findings.  Imaging: No results found.  Recent Labs: Lab Results  Component Value Date   WBC 6.6 07/11/2021   HGB 12.3 07/11/2021   PLT 231 07/11/2021   NA 141 07/11/2021   K 3.8 07/11/2021   CL 102 07/11/2021   CO2 32 07/11/2021   GLUCOSE 90 07/11/2021   BUN 27 (H) 07/11/2021   CREATININE 1.31 (H) 07/11/2021   BILITOT 0.4 07/11/2021   ALKPHOS 84 04/17/2021   AST 12 07/11/2021   ALT 9 07/11/2021   PROT 6.3 07/11/2021   ALBUMIN 4.1 04/17/2021   CALCIUM 9.8 07/11/2021   GFRAA 46 (L) 02/01/2021    Speciality Comments: No specialty comments available.  Procedures:  No procedures performed Allergies: Ace inhibitors    Assessment / Plan:     Visit Diagnoses: Rheumatoid arthritis involving multiple sites with positive rheumatoid factor (HCC) - with nodulosis and severe inflammatory arthritis with RF, positive anti-CCP, positive ANA, elevated ESR: She has no joint tenderness or synovitis on examination today.  She continues to experience intermittent pain and stiffness in the right hand and left knee joint.  She had synovial thickening over MCP joints but no tenderness or synovitis was noted.  She has painful range of motion and slightly limited extension of the left knee on exam.  She underwent Orthovisc injections in the left knee in July 2022 with minimal relief.  She plans on following up with her orthopedist to discuss scheduling a left knee total replacement in the future.  Overall she has clinically been doing well on Arava 20 mg 1 tablet by mouth daily and prednisone 5 mg 1 tablet daily.  She will remain on the current treatment regimen.  She is aware of the risks of long-term prednisone use.  She was advised to notify us if she develops increased joint pain or joint swelling.  She will follow-up in the office in 5  months.  High risk medication use - Arava 20 mg 1 tablet by mouth daily, prednisone 70m by mouth daily. CBC and CMP were drawn on 07/11/2021.  She is due to update lab work today.  Orders for CBC and CMP were  released.  Her next lab work will be due in February and every 3 months monitor drug toxicity.  Standing orders for CBC and CMP remain in place. - Plan: CBC with Differential/Platelet, COMPLETE METABOLIC PANEL WITH GFR Discussed the importance of holding [place] if she develops signs or symptoms of an infection and to resume once the infection is completely cleared.  Rheumatoid nodulosis ([place]): She has 2 small rheumatoid nodules on the extensor surface of her right elbow and 1 nodule for the left first MCP joint.  Positive ANA (antinuclear antibody) - ENA negative, C3-C4 normal.  She has no clinical features of systemic lupus.  Raynaud's disease without gangrene: Not currently active.  No digital ulcerations or signs of gangrene were noted.  Primary osteoarthritis of left knee - s/p orthovisc left knee July 2022.  She noticed very minimal improvement in her left knee joint pain after undergoing Visco gel injections in July 2022.  She had painful range of motion with limited extension on examination today of the left knee.  She is planning on following up with her orthopedist to discuss possibly proceeding with a left knee replacement in 2023.  She will continue to use a cane to assist with ambulation.  Status post total knee replacement, right: Doing well.  She has good range of motion with no discomfort.  No warmth or effusion was noted.  Other medical conditions are listed as follows:  Hyperuricemia  Stage 3b chronic kidney disease ([place]) - She sees her nephrologist, Dr. Justin Mend, on a yearly basis.  Chronic systolic congestive heart failure (HCC) - Followed by [name].  CAD in native artery, s/p cardiac cath with non obstructive CAD  Essential hypertension: Blood pressure 101/71 today  in the office.  Chronic atrial fibrillation  Chronic anticoagulation  NICM (nonischemic cardiomyopathy) ([place])  Vitamin D deficiency  Bacteremia due to Gram-negative bacteria - patient does not recall when she had sepsis.  Cerebrovascular accident (CVA) due to embolism of left cerebellar artery ([place])  Diverticulosis of colon with hemorrhage  Orders: Orders Placed This Encounter  Procedures   CBC with Differential/Platelet   COMPLETE METABOLIC PANEL WITH GFR    Meds ordered this encounter  Medications   predniSONE (DELTASONE) 5 MG tablet    Sig: Take 1 tablet (5 mg total) by mouth daily with breakfast.    Dispense:  90 tablet    Refill:  0   leflunomide (ARAVA) 20 MG tablet    Sig: Take 1 tablet (20 mg total) by mouth daily.    Dispense:  30 tablet    Refill:  2     Follow-Up Instructions: Return in 5 months (on 03/15/2022) for Rheumatoid arthritis.   Ofilia Neas, PA-C  Note - This record has been created using Dragon software.  Chart creation errors have been sought, but may not always  have been located. Such creation errors do not reflect on  the standard of medical care.

## 2021-10-15 ENCOUNTER — Other Ambulatory Visit: Payer: Self-pay

## 2021-10-15 ENCOUNTER — Ambulatory Visit: Payer: Medicare HMO | Admitting: Physician Assistant

## 2021-10-15 ENCOUNTER — Encounter: Payer: Self-pay | Admitting: Physician Assistant

## 2021-10-15 VITALS — BP 101/71 | HR 102 | Resp 17 | Ht 64.0 in | Wt 218.0 lb

## 2021-10-15 DIAGNOSIS — N1832 Chronic kidney disease, stage 3b: Secondary | ICD-10-CM

## 2021-10-15 DIAGNOSIS — Z96651 Presence of right artificial knee joint: Secondary | ICD-10-CM

## 2021-10-15 DIAGNOSIS — M0630 Rheumatoid nodule, unspecified site: Secondary | ICD-10-CM

## 2021-10-15 DIAGNOSIS — I7300 Raynaud's syndrome without gangrene: Secondary | ICD-10-CM

## 2021-10-15 DIAGNOSIS — M0579 Rheumatoid arthritis with rheumatoid factor of multiple sites without organ or systems involvement: Secondary | ICD-10-CM | POA: Diagnosis not present

## 2021-10-15 DIAGNOSIS — I10 Essential (primary) hypertension: Secondary | ICD-10-CM

## 2021-10-15 DIAGNOSIS — I2510 Atherosclerotic heart disease of native coronary artery without angina pectoris: Secondary | ICD-10-CM

## 2021-10-15 DIAGNOSIS — R7881 Bacteremia: Secondary | ICD-10-CM

## 2021-10-15 DIAGNOSIS — K5731 Diverticulosis of large intestine without perforation or abscess with bleeding: Secondary | ICD-10-CM

## 2021-10-15 DIAGNOSIS — I63442 Cerebral infarction due to embolism of left cerebellar artery: Secondary | ICD-10-CM

## 2021-10-15 DIAGNOSIS — R768 Other specified abnormal immunological findings in serum: Secondary | ICD-10-CM | POA: Diagnosis not present

## 2021-10-15 DIAGNOSIS — E790 Hyperuricemia without signs of inflammatory arthritis and tophaceous disease: Secondary | ICD-10-CM | POA: Diagnosis not present

## 2021-10-15 DIAGNOSIS — R7689 Other specified abnormal immunological findings in serum: Secondary | ICD-10-CM

## 2021-10-15 DIAGNOSIS — I428 Other cardiomyopathies: Secondary | ICD-10-CM

## 2021-10-15 DIAGNOSIS — I482 Chronic atrial fibrillation, unspecified: Secondary | ICD-10-CM

## 2021-10-15 DIAGNOSIS — I5022 Chronic systolic (congestive) heart failure: Secondary | ICD-10-CM

## 2021-10-15 DIAGNOSIS — E559 Vitamin D deficiency, unspecified: Secondary | ICD-10-CM

## 2021-10-15 DIAGNOSIS — M1712 Unilateral primary osteoarthritis, left knee: Secondary | ICD-10-CM

## 2021-10-15 DIAGNOSIS — Z7901 Long term (current) use of anticoagulants: Secondary | ICD-10-CM

## 2021-10-15 DIAGNOSIS — Z79899 Other long term (current) drug therapy: Secondary | ICD-10-CM

## 2021-10-15 MED ORDER — PREDNISONE 5 MG PO TABS: 5.0000 mg | ORAL_TABLET | Freq: Every day | ORAL | 0 refills | Status: AC

## 2021-10-15 MED ORDER — LEFLUNOMIDE 20 MG PO TABS: 20.0000 mg | ORAL_TABLET | Freq: Every day | ORAL | 2 refills | Status: DC

## 2021-10-15 NOTE — Patient Instructions (Signed)
Standing Labs We placed an order today for your standing lab work.   Please have your standing labs drawn in February and every 3 months   If possible, please have your labs drawn 2 weeks prior to your appointment so that the provider can discuss your results at your appointment.  Please note that you may see your imaging and lab results in MyChart before we have reviewed them. We may be awaiting multiple results to interpret others before contacting you. Please allow our office up to 72 hours to thoroughly review all of the results before contacting the office for clarification of your results.  We have open lab daily: Monday through Thursday from 1:30-4:30 PM and Friday from 1:30-4:00 PM at the office of Dr. Shaili Deveshwar, [facility].   Please be advised, all patients with office appointments requiring lab work will take precedent over walk-in lab work.  If possible, please come for your lab work on Monday and Friday afternoons, as you may experience shorter wait times. The office is located at [address], [address], [place], [place] [address] No appointment is necessary.   Labs are drawn by [facility]. Please bring your co-pay at the time of your lab draw.  You may receive a bill from [facility] for your lab work.  If you wish to have your labs drawn at another location, please call the office 24 hours in advance to send orders.  If you have any questions regarding directions or hours of operation,  please call [phone].   As a reminder, please drink plenty of water prior to coming for your lab work. Thanks!  

## 2021-10-16 DIAGNOSIS — N312 Flaccid neuropathic bladder, not elsewhere classified: Secondary | ICD-10-CM | POA: Diagnosis not present

## 2021-10-16 LAB — CBC WITH DIFFERENTIAL/PLATELET
Absolute Monocytes: 669 cells/uL (ref 200–950)
Basophils Absolute: 29 cells/uL (ref 0–200)
Basophils Relative: 0.3 %
Eosinophils Absolute: 39 cells/uL (ref 15–500)
Eosinophils Relative: 0.4 %
HCT: 42.3 % (ref 35.0–45.0)
Hemoglobin: 13 g/dL (ref 11.7–15.5)
Lymphs Abs: 601 cells/uL — ABNORMAL LOW (ref 850–3900)
MCH: 27.8 pg (ref 27.0–33.0)
MCHC: 30.7 g/dL — ABNORMAL LOW (ref 32.0–36.0)
MCV: 90.4 fL (ref 80.0–100.0)
MPV: 12 fL (ref 7.5–12.5)
Monocytes Relative: 6.9 %
Neutro Abs: 8361 cells/uL — ABNORMAL HIGH (ref 1500–7800)
Neutrophils Relative %: 86.2 %
Platelets: 168 10*3/uL (ref 140–400)
RBC: 4.68 10*6/uL (ref 3.80–5.10)
RDW: 14.2 % (ref 11.0–15.0)
Total Lymphocyte: 6.2 %
WBC: 9.7 10*3/uL (ref 3.8–10.8)

## 2021-10-16 LAB — COMPLETE METABOLIC PANEL WITH GFR
AG Ratio: 1.3 (calc) (ref 1.0–2.5)
ALT: 10 U/L (ref 6–29)
AST: 12 U/L (ref 10–35)
Albumin: 3.9 g/dL (ref 3.6–5.1)
Alkaline phosphatase (APISO): 71 U/L (ref 37–153)
BUN/Creatinine Ratio: 21 (calc) (ref 6–22)
BUN: 38 mg/dL — ABNORMAL HIGH (ref 7–25)
CO2: 32 mmol/L (ref 20–32)
Calcium: 8.8 mg/dL (ref 8.6–10.4)
Chloride: 101 mmol/L (ref 98–110)
Creat: 1.78 mg/dL — ABNORMAL HIGH (ref 0.50–1.05)
Globulin: 3 g/dL (calc) (ref 1.9–3.7)
Glucose, Bld: 90 mg/dL (ref 65–99)
Potassium: 4.7 mmol/L (ref 3.5–5.3)
Sodium: 141 mmol/L (ref 135–146)
Total Bilirubin: 0.5 mg/dL (ref 0.2–1.2)
Total Protein: 6.9 g/dL (ref 6.1–8.1)
eGFR: 31 mL/min/{1.73_m2} — ABNORMAL LOW (ref >=60)

## 2021-10-16 NOTE — Progress Notes (Signed)
Absolute neutrophils are slightly elevated and absolute lymphocytes are slightly low.    Creatinine remains elevated and has trended up-1.78 and GFR is low-31.  Please notify the patient and forward lab work to her nephrologist.  Please clarify when her next appt with nephrology is

## 2021-10-21 ENCOUNTER — Other Ambulatory Visit: Payer: Self-pay

## 2021-10-21 NOTE — Patient Outreach (Signed)
[facility]) Care Management  10/21/2021  SURAIYA DICKERSON 28-Mar-1951 [id number]   Telephone Assessment    Unsuccessful quarterly outreach attempt to patient.     Plan: RN CM will make quarterly outreach attempt to patient within the month of Feb.   Damareon Lanni Verl [place] Management Telephonic Care Management Coordinator Direct Phone: [phone] Toll Free: [phone] Fax: [phone]

## 2021-10-22 ENCOUNTER — Ambulatory Visit: Payer: Self-pay

## 2021-10-25 NOTE — Telephone Encounter (Signed)
Patient called in  Advised her of Amys message  Patient says she does not wish to schedule appt at this time due to the oop price

## 2021-10-26 NOTE — Telephone Encounter (Signed)
Forwarding to Dr. Ronnald Ramp as Juluis Rainier.   Unfortunately, since pt has Medicare, pt does not qualify for [employer]'s co-pay assistance program.

## 2021-11-09 ENCOUNTER — Other Ambulatory Visit: Payer: Self-pay | Admitting: Physician Assistant

## 2021-11-13 DIAGNOSIS — R338 Other retention of urine: Secondary | ICD-10-CM | POA: Diagnosis not present

## 2021-11-15 ENCOUNTER — Other Ambulatory Visit: Payer: Self-pay | Admitting: Physician Assistant

## 2021-11-15 ENCOUNTER — Telehealth (HOSPITAL_COMMUNITY): Payer: Self-pay | Admitting: *Deleted

## 2021-11-15 NOTE — Telephone Encounter (Signed)
Pt called to request samples of entresto and jardiance, she states she is in the donut hole so cost is too expensive and her [employer] foundation ran out of money. Samples will be left at front desk for pt  Medication Samples have been provided to the patient.  Drug name: Jardiance       Strength: 10mg         Qty: 3  LOT: [id number]  Exp.Date: 06/2023  Dosing instructions: take 1 tab Daily  The patient has been instructed regarding the correct time, dose, and frequency of taking this medication, including desired effects and most common side effects.   Nira Conn Rayelynn Loyal 2:47 PM 11/15/2021   Medication Samples have been provided to the patient.  Drug name: Delene Loll       Strength: 24/26mg         Qty: 2  LOT: [id number]  Exp.Date: 08/2023  Dosing instructions: take 1 tab Twice daily   The patient has been instructed regarding the correct time, dose, and frequency of taking this medication, including desired effects and most common side effects.   Azhane Eckart 2:48 PM 11/15/2021

## 2021-12-11 DIAGNOSIS — N312 Flaccid neuropathic bladder, not elsewhere classified: Secondary | ICD-10-CM | POA: Diagnosis not present

## 2021-12-13 ENCOUNTER — Telehealth (HOSPITAL_COMMUNITY): Payer: Self-pay | Admitting: Pharmacy Technician

## 2021-12-13 NOTE — Telephone Encounter (Signed)
Advanced Heart Failure Patient Advocate Encounter   The [employer] HF grant is currently accepting applicants who are on the wait list. Applied for grant, should have a determination in 4 business days.

## 2021-12-20 DIAGNOSIS — N312 Flaccid neuropathic bladder, not elsewhere classified: Secondary | ICD-10-CM | POA: Diagnosis not present

## 2021-12-23 ENCOUNTER — Telehealth (HOSPITAL_COMMUNITY): Payer: Self-pay | Admitting: Pharmacy Technician

## 2021-12-23 NOTE — Telephone Encounter (Signed)
Patient Advocate Encounter   Received notification from [facility] that prior authorization for Mexiletine is required.   PA submitted on CoverMyMeds Key [id number] Status is pending   Will continue to follow.

## 2021-12-26 ENCOUNTER — Encounter: Payer: Self-pay | Admitting: Nurse Practitioner

## 2021-12-26 ENCOUNTER — Ambulatory Visit (INDEPENDENT_AMBULATORY_CARE_PROVIDER_SITE_OTHER): Payer: Medicare HMO | Admitting: Nurse Practitioner

## 2021-12-26 ENCOUNTER — Other Ambulatory Visit: Payer: Self-pay

## 2021-12-26 VITALS — BP 110/60 | HR 88

## 2021-12-26 DIAGNOSIS — R0902 Hypoxemia: Secondary | ICD-10-CM

## 2021-12-26 DIAGNOSIS — R197 Diarrhea, unspecified: Secondary | ICD-10-CM

## 2021-12-26 LAB — POC COVID19 BINAXNOW: SARS Coronavirus 2 Ag: NEGATIVE

## 2021-12-26 NOTE — Telephone Encounter (Signed)
Received notification from [facility] regarding a prior authorization for  MEXILETINE . Authorization has been APPROVED from 12/23/21 to 11/23/22.   Document scanned into chart  Phone # [phone]

## 2021-12-26 NOTE — Progress Notes (Signed)
Subjective:  Patient ID: Patricia Wagner, female    DOB: October 26, 1951  Age: 71 y.o. MRN: [id number]  CC:  Chief Complaint  Patient presents with   Diarrhea    Has been going on for a week. Has started about 2 weeks ago Mexitil and believes this is a side effect of that medication.   fluid in ears    Both ears      HPI  This patient arrives today for the above.  She reports having diarrhea for about 1 week. She was taking mexitil, but stopped this on her own about 2 weeks ago. She wasn't sure if the diarrhea was being caused by this medication. She reports that her stool is watery at baseline but the amount of daily stools increased last week. Today, the number of stools have gone back closer to her baseline and reports she is now having less diarrhea. She would like to see a gastroenterologist.  She denies any fever or recent travel.  She also feels like she has fluid in her ears.    Past Medical History:  Diagnosis Date   Acute blood loss anemia 03/05/2018   Acute hypercapnic respiratory failure ([place]) 04/30/2017   Acute renal failure superimposed on chronic kidney disease ([place]) 10/20/2016   Arthritis    hands, knees   Atrial fibrillation ([place])    a. s/p multiple cardioversions; failed tikosyn/sotalol.   Bradycardia 11/20/2016   CAD in native artery, s/p cardiac cath with non obstructive CAD 10/24/2016   Cerebrovascular accident (CVA) due to embolism of left cerebellar artery (HCC)    CHF (congestive heart failure) ([place])    Chronic anticoagulation 03/15/2018   Chronic atrial fibrillation ([place]) 1/63/8466   Chronic systolic (congestive) heart failure ([place]) 03/05/2018   CKD (chronic kidney disease) stage 3, GFR 30-59 ml/min (HCC)    Coagulopathy ([place]) 03/05/2018   DIVERTICULITIS, HX OF 07/25/2007   Diverticulosis of colon with hemorrhage 07/22/2007   Qualifier: Diagnosis of  By: [name]     DIVERTICULOSIS, COLON 07/22/2007   Dysuria 07/21/2017   Edema, peripheral    a.  chronic BLE edema, R>L. Prior trauma from dog attack and accident.   Essential hypertension 07/22/2007   Qualifier: Diagnosis of  By: [name]     History of kidney stones    HLD (hyperlipidemia) 02/03/2008   Qualifier: Diagnosis of  By: [name], James W    HYPERLIPIDEMIA 02/03/2008   Hypersomnia    declines w/u   Hypertension    Hypotension (arterial) 04/30/2017   MENOPAUSAL DISORDER 01/09/2011   Morbid obesity ([place]) 07/22/2007   NICM (nonischemic cardiomyopathy) ([place]) 10/24/2016   PAF (paroxysmal atrial fibrillation) ([place])    Presence of indwelling urinary catheter    supra pubic cath   Raynaud's syndrome 07/22/2007   Stroke ([place]) 2017   Suspected sleep apnea 05/20/2017   THYROID NODULE, RIGHT 01/04/2010   VITAMIN D DEFICIENCY 01/09/2011   Qualifier: Diagnosis of  By: [name], [name]       Family History  Problem Relation Age of Onset   Asthma Mother    Diabetes Father    Heart disease Father        Died of presumed heart attack - 44s   Lung disease Sister    Heart disease Sister        Twin sister has heart issue, unclear what kind   Healthy Daughter    Thyroid disease Neg Hx    Colon polyps Neg  Hx    Esophageal cancer Neg Hx    Pancreatic cancer Neg Hx    Stomach cancer Neg Hx     Social History   Social History Narrative   Not on file   Social History   Tobacco Use   Smoking status: Former    Packs/day: 0.50    Types: Cigarettes    Quit date: 2019    Years since quitting: 4.0   Smokeless tobacco: Never  Substance Use Topics   Alcohol use: Yes    Comment: occ     Current Meds  Medication Sig   Acetaminophen (TYLENOL PO) Take by mouth as needed.    furosemide (LASIX) 80 MG tablet Take 1 tablet (80 mg total) by mouth daily. Take extra tab in PM as needed   JARDIANCE 10 MG TABS tablet TAKE 1 TABLET BY MOUTH DAILY BEFORE BREAKFAST.   leflunomide (ARAVA) 20 MG tablet Take 1 tablet (20 mg total) by mouth daily.   mexiletine (MEXITIL) 200 MG capsule  Take 1 capsule (200 mg total) by mouth 2 (two) times daily.   Potassium Chloride ER 20 MEQ TBCR TAKE 1 TABLET BY MOUTH EVERY DAY   predniSONE (DELTASONE) 5 MG tablet Take 1 tablet (5 mg total) by mouth daily with breakfast.   rosuvastatin (CRESTOR) 10 MG tablet Take 1 tablet (10 mg total) by mouth daily.   sacubitril-valsartan (ENTRESTO) 24-26 MG Take 1 tablet by mouth 2 (two) times daily.    ROS:  Review of Systems  Constitutional:  Negative for fever.  Respiratory:  Positive for shortness of breath.   Gastrointestinal:  Positive for diarrhea. Negative for abdominal pain and blood in stool.    Objective:   Today's Vitals: BP 110/60    Pulse 88    SpO2 (!) 83%  Vitals with BMI 12/26/2021 10/15/2021 09/30/2021  Height - 5\' 4"  5\' 4"   Weight - 218 lbs 215 lbs 6 oz  BMI - 62.2 29.79  Systolic 892 119 417  Diastolic 60 71 80  Pulse 88 102 62    *She had a near syncopal event in the lobby. Vitals were checked in the lobby and initially her SpO2 was 51%, but improved to 96% with rest on room air. Her hands were also cold.  Blood pressure was stable at 114/60.  Physical Exam Vitals reviewed.  Constitutional:      General: She is not in acute distress.    Appearance: Normal appearance.  HENT:     Head: Normocephalic and atraumatic.  Neck:     Vascular: No carotid bruit.  Cardiovascular:     Rate and Rhythm: Normal rate. Rhythm irregularly irregular.     Pulses: Normal pulses.     Heart sounds: Normal heart sounds.  Pulmonary:     Effort: Pulmonary effort is normal.     Breath sounds: Normal breath sounds.  Musculoskeletal:     Right lower leg: Edema present.     Left lower leg: Edema present.  Skin:    General: Skin is warm and dry.  Neurological:     General: No focal deficit present.     Mental Status: She is alert and oriented to person, place, and time.  Psychiatric:        Mood and Affect: Mood normal.        Behavior: Behavior normal.        Judgment: Judgment normal.     *During physical exam, patient was found to be hypoxic again on room  air with oxygen saturation of 80%.  She was placed on 2 L of oxygen via nasal cannula and oxygen saturation increased up to 96%.  Her hands were still cold so I held her hand to warm it up and rechecked her oxygen saturation and again on room air dropped to 88%.  She was again placed on 2 L of oxygen and oxygen saturation came up to 96%.  POC Covid 19 test: Negative   Assessment and Plan   1. Hypoxia   2. Diarrhea, unspecified type      Plan: 1.  I am very concerned about her hypoxia, at first I was not sure if maybe the equipment was faulty, however manual heart rate was taken and it did match heart rate on pulse oximeter.  I did not note any wheezes or crackles on exam, and COVID-19 point-of-care test was negative.  We called 911 to have patient transported to hospital for evaluation of her acute hypoxia. 2.  Her diarrhea seems to be improving with time, I am not sure if this is related to the Mexitil she was taking or other etiology.  I will place outpatient referral to gastroenterology, but encouraged her to discuss this with the emergency department physician to see if they want to test her for C. difficile while she is in the hospital.   Tests ordered Orders Placed This Encounter  Procedures   Ambulatory referral to Gastroenterology   POC COVID-19      No orders of the defined types were placed in this encounter.   Patient to follow-up with PCP at next month as scheduled.  Ailene Ards, NP

## 2021-12-30 ENCOUNTER — Emergency Department (HOSPITAL_COMMUNITY): Payer: Medicare HMO

## 2021-12-30 ENCOUNTER — Other Ambulatory Visit: Payer: Self-pay

## 2021-12-30 ENCOUNTER — Inpatient Hospital Stay (HOSPITAL_COMMUNITY)
Admission: EM | Admit: 2021-12-30 | Discharge: 2022-01-18 | DRG: 698 | Disposition: A | Discharge status: Home Health | Payer: Medicare HMO | Attending: Internal Medicine | Admitting: Internal Medicine

## 2021-12-30 DIAGNOSIS — I5042 Chronic combined systolic (congestive) and diastolic (congestive) heart failure: Secondary | ICD-10-CM | POA: Diagnosis not present

## 2021-12-30 DIAGNOSIS — I639 Cerebral infarction, unspecified: Secondary | ICD-10-CM | POA: Diagnosis not present

## 2021-12-30 DIAGNOSIS — I7300 Raynaud's syndrome without gangrene: Secondary | ICD-10-CM | POA: Diagnosis present

## 2021-12-30 DIAGNOSIS — I130 Hypertensive heart and chronic kidney disease with heart failure and stage 1 through stage 4 chronic kidney disease, or unspecified chronic kidney disease: Secondary | ICD-10-CM | POA: Diagnosis present

## 2021-12-30 DIAGNOSIS — I6502 Occlusion and stenosis of left vertebral artery: Secondary | ICD-10-CM | POA: Diagnosis not present

## 2021-12-30 DIAGNOSIS — Z833 Family history of diabetes mellitus: Secondary | ICD-10-CM

## 2021-12-30 DIAGNOSIS — I471 Supraventricular tachycardia: Secondary | ICD-10-CM | POA: Diagnosis present

## 2021-12-30 DIAGNOSIS — J9602 Acute respiratory failure with hypercapnia: Secondary | ICD-10-CM | POA: Diagnosis present

## 2021-12-30 DIAGNOSIS — I25110 Atherosclerotic heart disease of native coronary artery with unstable angina pectoris: Secondary | ICD-10-CM | POA: Diagnosis present

## 2021-12-30 DIAGNOSIS — Z20822 Contact with and (suspected) exposure to covid-19: Secondary | ICD-10-CM | POA: Diagnosis present

## 2021-12-30 DIAGNOSIS — R471 Dysarthria and anarthria: Secondary | ICD-10-CM | POA: Diagnosis not present

## 2021-12-30 DIAGNOSIS — Z888 Allergy status to other drugs, medicaments and biological substances status: Secondary | ICD-10-CM

## 2021-12-30 DIAGNOSIS — Z90711 Acquired absence of uterus with remaining cervical stump: Secondary | ICD-10-CM

## 2021-12-30 DIAGNOSIS — I5043 Acute on chronic combined systolic (congestive) and diastolic (congestive) heart failure: Secondary | ICD-10-CM | POA: Diagnosis not present

## 2021-12-30 DIAGNOSIS — I509 Heart failure, unspecified: Secondary | ICD-10-CM | POA: Diagnosis not present

## 2021-12-30 DIAGNOSIS — D6959 Other secondary thrombocytopenia: Secondary | ICD-10-CM | POA: Diagnosis present

## 2021-12-30 DIAGNOSIS — Z6841 Body Mass Index (BMI) 40.0 and over, adult: Secondary | ICD-10-CM | POA: Diagnosis not present

## 2021-12-30 DIAGNOSIS — B9689 Other specified bacterial agents as the cause of diseases classified elsewhere: Secondary | ICD-10-CM | POA: Diagnosis present

## 2021-12-30 DIAGNOSIS — I63443 Cerebral infarction due to embolism of bilateral cerebellar arteries: Secondary | ICD-10-CM | POA: Diagnosis present

## 2021-12-30 DIAGNOSIS — E861 Hypovolemia: Secondary | ICD-10-CM | POA: Diagnosis present

## 2021-12-30 DIAGNOSIS — N183 Chronic kidney disease, stage 3 unspecified: Secondary | ICD-10-CM | POA: Diagnosis not present

## 2021-12-30 DIAGNOSIS — Y846 Urinary catheterization as the cause of abnormal reaction of the patient, or of later complication, without mention of misadventure at the time of the procedure: Secondary | ICD-10-CM | POA: Diagnosis present

## 2021-12-30 DIAGNOSIS — T83511A Infection and inflammatory reaction due to indwelling urethral catheter, initial encounter: Principal | ICD-10-CM | POA: Diagnosis present

## 2021-12-30 DIAGNOSIS — T502X5A Adverse effect of carbonic-anhydrase inhibitors, benzothiadiazides and other diuretics, initial encounter: Secondary | ICD-10-CM | POA: Diagnosis present

## 2021-12-30 DIAGNOSIS — I6523 Occlusion and stenosis of bilateral carotid arteries: Secondary | ICD-10-CM | POA: Diagnosis not present

## 2021-12-30 DIAGNOSIS — J449 Chronic obstructive pulmonary disease, unspecified: Secondary | ICD-10-CM | POA: Diagnosis present

## 2021-12-30 DIAGNOSIS — N390 Urinary tract infection, site not specified: Secondary | ICD-10-CM | POA: Diagnosis not present

## 2021-12-30 DIAGNOSIS — Z7189 Other specified counseling: Secondary | ICD-10-CM | POA: Diagnosis not present

## 2021-12-30 DIAGNOSIS — E876 Hypokalemia: Secondary | ICD-10-CM | POA: Diagnosis present

## 2021-12-30 DIAGNOSIS — I472 Ventricular tachycardia, unspecified: Secondary | ICD-10-CM | POA: Diagnosis present

## 2021-12-30 DIAGNOSIS — I482 Chronic atrial fibrillation, unspecified: Secondary | ICD-10-CM | POA: Diagnosis present

## 2021-12-30 DIAGNOSIS — D696 Thrombocytopenia, unspecified: Secondary | ICD-10-CM | POA: Diagnosis present

## 2021-12-30 DIAGNOSIS — I214 Non-ST elevation (NSTEMI) myocardial infarction: Secondary | ICD-10-CM | POA: Diagnosis present

## 2021-12-30 DIAGNOSIS — G9389 Other specified disorders of brain: Secondary | ICD-10-CM | POA: Diagnosis not present

## 2021-12-30 DIAGNOSIS — M069 Rheumatoid arthritis, unspecified: Secondary | ICD-10-CM | POA: Diagnosis present

## 2021-12-30 DIAGNOSIS — Z8673 Personal history of transient ischemic attack (TIA), and cerebral infarction without residual deficits: Secondary | ICD-10-CM

## 2021-12-30 DIAGNOSIS — J9601 Acute respiratory failure with hypoxia: Secondary | ICD-10-CM | POA: Diagnosis present

## 2021-12-30 DIAGNOSIS — E669 Obesity, unspecified: Secondary | ICD-10-CM | POA: Diagnosis present

## 2021-12-30 DIAGNOSIS — I493 Ventricular premature depolarization: Secondary | ICD-10-CM | POA: Diagnosis present

## 2021-12-30 DIAGNOSIS — Z992 Dependence on renal dialysis: Secondary | ICD-10-CM

## 2021-12-30 DIAGNOSIS — N319 Neuromuscular dysfunction of bladder, unspecified: Secondary | ICD-10-CM | POA: Diagnosis present

## 2021-12-30 DIAGNOSIS — D631 Anemia in chronic kidney disease: Secondary | ICD-10-CM | POA: Diagnosis present

## 2021-12-30 DIAGNOSIS — D72829 Elevated white blood cell count, unspecified: Secondary | ICD-10-CM | POA: Diagnosis present

## 2021-12-30 DIAGNOSIS — I779 Disorder of arteries and arterioles, unspecified: Secondary | ICD-10-CM | POA: Diagnosis not present

## 2021-12-30 DIAGNOSIS — I272 Pulmonary hypertension, unspecified: Secondary | ICD-10-CM | POA: Diagnosis present

## 2021-12-30 DIAGNOSIS — Z515 Encounter for palliative care: Secondary | ICD-10-CM

## 2021-12-30 DIAGNOSIS — I081 Rheumatic disorders of both mitral and tricuspid valves: Secondary | ICD-10-CM | POA: Diagnosis present

## 2021-12-30 DIAGNOSIS — I5082 Biventricular heart failure: Secondary | ICD-10-CM | POA: Diagnosis present

## 2021-12-30 DIAGNOSIS — E874 Mixed disorder of acid-base balance: Secondary | ICD-10-CM | POA: Diagnosis present

## 2021-12-30 DIAGNOSIS — Z87891 Personal history of nicotine dependence: Secondary | ICD-10-CM

## 2021-12-30 DIAGNOSIS — I480 Paroxysmal atrial fibrillation: Secondary | ICD-10-CM | POA: Diagnosis present

## 2021-12-30 DIAGNOSIS — Z825 Family history of asthma and other chronic lower respiratory diseases: Secondary | ICD-10-CM

## 2021-12-30 DIAGNOSIS — R29818 Other symptoms and signs involving the nervous system: Secondary | ICD-10-CM | POA: Diagnosis not present

## 2021-12-30 DIAGNOSIS — I10 Essential (primary) hypertension: Secondary | ICD-10-CM | POA: Diagnosis not present

## 2021-12-30 DIAGNOSIS — Z96651 Presence of right artificial knee joint: Secondary | ICD-10-CM | POA: Diagnosis present

## 2021-12-30 DIAGNOSIS — T83511S Infection and inflammatory reaction due to indwelling urethral catheter, sequela: Secondary | ICD-10-CM | POA: Diagnosis not present

## 2021-12-30 DIAGNOSIS — R4781 Slurred speech: Secondary | ICD-10-CM | POA: Diagnosis not present

## 2021-12-30 DIAGNOSIS — N179 Acute kidney failure, unspecified: Secondary | ICD-10-CM

## 2021-12-30 DIAGNOSIS — E785 Hyperlipidemia, unspecified: Secondary | ICD-10-CM | POA: Diagnosis present

## 2021-12-30 DIAGNOSIS — D649 Anemia, unspecified: Secondary | ICD-10-CM | POA: Diagnosis not present

## 2021-12-30 DIAGNOSIS — N281 Cyst of kidney, acquired: Secondary | ICD-10-CM | POA: Diagnosis not present

## 2021-12-30 DIAGNOSIS — I428 Other cardiomyopathies: Secondary | ICD-10-CM | POA: Diagnosis present

## 2021-12-30 DIAGNOSIS — N1832 Chronic kidney disease, stage 3b: Secondary | ICD-10-CM | POA: Diagnosis present

## 2021-12-30 DIAGNOSIS — R778 Other specified abnormalities of plasma proteins: Secondary | ICD-10-CM | POA: Diagnosis not present

## 2021-12-30 DIAGNOSIS — I771 Stricture of artery: Secondary | ICD-10-CM | POA: Diagnosis not present

## 2021-12-30 DIAGNOSIS — N170 Acute kidney failure with tubular necrosis: Secondary | ICD-10-CM | POA: Diagnosis present

## 2021-12-30 DIAGNOSIS — Z87898 Personal history of other specified conditions: Secondary | ICD-10-CM

## 2021-12-30 DIAGNOSIS — D6489 Other specified anemias: Secondary | ICD-10-CM | POA: Diagnosis present

## 2021-12-30 DIAGNOSIS — B961 Klebsiella pneumoniae [K. pneumoniae] as the cause of diseases classified elsewhere: Secondary | ICD-10-CM | POA: Diagnosis present

## 2021-12-30 DIAGNOSIS — I5021 Acute systolic (congestive) heart failure: Secondary | ICD-10-CM | POA: Diagnosis not present

## 2021-12-30 DIAGNOSIS — I2510 Atherosclerotic heart disease of native coronary artery without angina pectoris: Secondary | ICD-10-CM | POA: Diagnosis present

## 2021-12-30 DIAGNOSIS — R5381 Other malaise: Secondary | ICD-10-CM | POA: Diagnosis present

## 2021-12-30 DIAGNOSIS — R338 Other retention of urine: Secondary | ICD-10-CM | POA: Diagnosis present

## 2021-12-30 DIAGNOSIS — N3091 Cystitis, unspecified with hematuria: Secondary | ICD-10-CM | POA: Diagnosis present

## 2021-12-30 DIAGNOSIS — G9341 Metabolic encephalopathy: Secondary | ICD-10-CM | POA: Diagnosis present

## 2021-12-30 DIAGNOSIS — I517 Cardiomegaly: Secondary | ICD-10-CM | POA: Diagnosis not present

## 2021-12-30 DIAGNOSIS — J9621 Acute and chronic respiratory failure with hypoxia: Secondary | ICD-10-CM | POA: Diagnosis not present

## 2021-12-30 DIAGNOSIS — Z8249 Family history of ischemic heart disease and other diseases of the circulatory system: Secondary | ICD-10-CM

## 2021-12-30 DIAGNOSIS — Z7952 Long term (current) use of systemic steroids: Secondary | ICD-10-CM

## 2021-12-30 DIAGNOSIS — I110 Hypertensive heart disease with heart failure: Secondary | ICD-10-CM | POA: Diagnosis not present

## 2021-12-30 DIAGNOSIS — R0902 Hypoxemia: Secondary | ICD-10-CM | POA: Diagnosis not present

## 2021-12-30 DIAGNOSIS — K922 Gastrointestinal hemorrhage, unspecified: Secondary | ICD-10-CM | POA: Diagnosis not present

## 2021-12-30 DIAGNOSIS — I5022 Chronic systolic (congestive) heart failure: Secondary | ICD-10-CM | POA: Diagnosis not present

## 2021-12-30 DIAGNOSIS — Z9049 Acquired absence of other specified parts of digestive tract: Secondary | ICD-10-CM

## 2021-12-30 DIAGNOSIS — Z79899 Other long term (current) drug therapy: Secondary | ICD-10-CM

## 2021-12-30 DIAGNOSIS — Z87442 Personal history of urinary calculi: Secondary | ICD-10-CM

## 2021-12-30 DIAGNOSIS — R4182 Altered mental status, unspecified: Secondary | ICD-10-CM | POA: Diagnosis not present

## 2021-12-30 LAB — I-STAT ARTERIAL BLOOD GAS, ED
Acid-base deficit: 9 mmol/L — ABNORMAL HIGH (ref 0.0–2.0)
Bicarbonate: 21.1 mmol/L (ref 20.0–28.0)
Calcium, Ion: 1.08 mmol/L — ABNORMAL LOW (ref 1.15–1.40)
HCT: 42 % (ref 36.0–46.0)
Hemoglobin: 14.3 g/dL (ref 12.0–15.0)
O2 Saturation: 90 %
Patient temperature: 98.6
Potassium: 4.3 mmol/L (ref 3.5–5.1)
Sodium: 135 mmol/L (ref 135–145)
TCO2: 23 mmol/L (ref 22–32)
pCO2 arterial: 62.3 mmHg — ABNORMAL HIGH (ref 32.0–48.0)
pH, Arterial: 7.137 — CL (ref 7.350–7.450)
pO2, Arterial: 79 mmHg — ABNORMAL LOW (ref 83.0–108.0)

## 2021-12-30 LAB — CBG MONITORING, ED: Glucose-Capillary: 96 mg/dL (ref 70–99)

## 2021-12-30 NOTE — ED Triage Notes (Signed)
Pt brought in by EMS from home for a possible stroke. Per crew, family reports pt has slurred speech and right arm weakness. Pt has a history of stroke and TIA. Last Known Normal 1500.

## 2021-12-30 NOTE — ED Provider Notes (Signed)
[facility] EMERGENCY DEPARTMENT Provider Note   CSN: [id number] Arrival date & time: 12/30/21  2219     History  Chief Complaint  Patient presents with   Altered Mental Status    Patricia Wagner is a 71 y.o. female.  71 y/o female with hx of Afib (off Eliquis 2/2 GIB), CAD, NICM (LVEF 35-40%), HTN, HLD, PVCs, CVA, CKD, flaccid neuropathic bladder s/p chronic catheter presents to the ED via EMS from home. Patient lives alone, ambulatory at baseline with a walker. Was transported for concern regarding slurred speech after daughter in [place] called patient and noticed speech changes. Grandson at bedside reports a friend had to come give the patient a bath today; she is usually able to bathe herself. Patient's only complaint is feeling more fatigued and sleepy lately. Patient denies headache, chest pain, cough, vomiting, fevers. EMS reports LKN 1500.  The history is provided by the patient, a relative and the EMS personnel. No language interpreter was used.  Cerebrovascular Accident      Home Medications Prior to Admission medications   Medication Sig Start Date End Date Taking? Authorizing Provider  Acetaminophen (TYLENOL PO) Take by mouth as needed.     [provider]  Cholecalciferol (VITAMIN D3) 25 MCG (1000 UT) CAPS Take 1,000 Units by mouth daily. Patient not taking: Reported on 10/15/2021    [provider]  furosemide (LASIX) 80 MG tablet Take 1 tablet (80 mg total) by mouth daily. Take extra tab in PM as needed 08/12/21   Bensimhon, Shaune Pascal, MD  JARDIANCE 10 MG TABS tablet TAKE 1 TABLET BY MOUTH DAILY BEFORE BREAKFAST. Patient taking differently: Take 10 mg by mouth daily. 08/26/21   Bensimhon, Shaune Pascal, MD  leflunomide (ARAVA) 20 MG tablet Take 1 tablet (20 mg total) by mouth daily. 10/15/21   Ofilia Neas, PA-C  Potassium Chloride ER 20 MEQ TBCR TAKE 1 TABLET BY MOUTH EVERY DAY Patient taking differently: Take 20 mEq by mouth daily. 09/27/21    Bensimhon, Shaune Pascal, MD  predniSONE (DELTASONE) 5 MG tablet Take 1 tablet (5 mg total) by mouth daily with breakfast. 10/15/21   Ofilia Neas, PA-C  rosuvastatin (CRESTOR) 10 MG tablet Take 1 tablet (10 mg total) by mouth daily. 05/23/21   Janith Lima, MD  sacubitril-valsartan (ENTRESTO) 24-26 MG Take 1 tablet by mouth 2 (two) times daily. 08/12/21   Bensimhon, Shaune Pascal, MD      Allergies    Ace inhibitors    Review of Systems   Review of Systems Ten systems reviewed and are negative for acute change, except as noted in the HPI.    Physical Exam Updated Vital Signs BP (!) 95/58    Pulse 79    Temp (!) 97.5 F (36.4 C) (Oral)    Resp 18    Ht 5\' 4"  (1.626 m)    Wt 98.9 kg    SpO2 97%    BMI 37.43 kg/m   Physical Exam Vitals and nursing note reviewed.  Constitutional:      General: She is not in acute distress.    Appearance: She is well-developed. She is not diaphoretic.     Comments: Somnolent, but easily aroused. Will wake to answer questions. Often breathing through open mouth.  HENT:     Head: Normocephalic and atraumatic.     Right Ear: External ear normal.     Left Ear: External ear normal.     Mouth/Throat:  Mouth: Mucous membranes are dry.  Eyes:     General: No scleral icterus.    Extraocular Movements: Extraocular movements intact.     Conjunctiva/sclera: Conjunctivae normal.     Pupils: Pupils are equal, round, and reactive to light.     Comments: Appropriate tracking. No appreciable nystagmus. No gaze preference.  Cardiovascular:     Rate and Rhythm: Normal rate and regular rhythm.     Pulses: Normal pulses.     Comments: Frequent PVCs on monitor Pulmonary:     Effort: Pulmonary effort is normal. No respiratory distress.     Breath sounds: No wheezing.     Comments: SpO2 high 70's/low 80's on monitor. SpO2 improved to 92% on 2L via [place]. No overt wheezing or rales noted. Abdominal:     Palpations: Abdomen is soft.     Tenderness: There is no abdominal  tenderness.     Comments: Soft, obese abdomen  Musculoskeletal:        General: Normal range of motion.     Cervical back: Normal range of motion.     Right lower leg: No edema.     Left lower leg: No edema.  Skin:    General: Skin is warm and dry.     Coloration: Skin is not pale.     Findings: No erythema or rash.  Neurological:     Mental Status: She is oriented to person, place, and time.     Cranial Nerves: No cranial nerve deficit.     Sensory: No sensory deficit.     Comments: GCS 15. Alert to self, place year. Answers questions appropriately, though speech does sound slurred. No cranial nerve deficits appreciated; symmetric eyebrow raise, no facial drooping, tongue midline. Sensation to light touch intact and equal bilaterally. Strength testing is challenging, but no obvious unilateral strength deficit. Gait not assessed.  Psychiatric:        Behavior: Behavior normal.    ED Results / Procedures / Treatments   Labs (all labs ordered are listed, but only abnormal results are displayed) Labs Reviewed  PROTIME-INR - Abnormal; Notable for the following components:      Result Value   Prothrombin Time 17.6 (*)    INR 1.5 (*)    All other components within normal limits  CBC - Abnormal; Notable for the following components:   WBC 12.9 (*)    Platelets 98 (*)    nRBC 6.7 (*)    All other components within normal limits  DIFFERENTIAL - Abnormal; Notable for the following components:   Neutro Abs 11.5 (*)    Lymphs Abs 0.4 (*)    Abs Immature Granulocytes 0.12 (*)    All other components within normal limits  COMPREHENSIVE METABOLIC PANEL - Abnormal; Notable for the following components:   CO2 16 (*)    BUN 128 (*)    Creatinine, Ser 4.95 (*)    Calcium 8.6 (*)    Total Protein 6.0 (*)    Albumin 3.0 (*)    Alkaline Phosphatase 151 (*)    Total Bilirubin 1.3 (*)    GFR, Estimated 9 (*)    Anion gap 16 (*)    All other components within normal limits  BRAIN  NATRIURETIC PEPTIDE - Abnormal; Notable for the following components:   B Natriuretic Peptide >4,500.0 (*)    All other components within normal limits  I-STAT CHEM 8, ED - Abnormal; Notable for the following components:   BUN >130 (*)  Creatinine, Ser 4.90 (*)    Calcium, Ion 1.03 (*)    All other components within normal limits  I-STAT ARTERIAL BLOOD GAS, ED - Abnormal; Notable for the following components:   pH, Arterial 7.137 (*)    pCO2 arterial 62.3 (*)    pO2, Arterial 79 (*)    Acid-base deficit 9.0 (*)    Calcium, Ion 1.08 (*)    All other components within normal limits  TROPONIN I (HIGH SENSITIVITY) - Abnormal; Notable for the following components:   Troponin I (High Sensitivity) 5,916 (*)    All other components within normal limits  TROPONIN I (HIGH SENSITIVITY) - Abnormal; Notable for the following components:   Troponin I (High Sensitivity) 5,616 (*)    All other components within normal limits  RESP PANEL BY RT-PCR (FLU A&B, COVID) ARPGX2  ETHANOL  APTT  LACTIC ACID, PLASMA  RAPID URINE DRUG SCREEN, HOSP PERFORMED  URINALYSIS, ROUTINE W REFLEX MICROSCOPIC  PATHOLOGIST SMEAR REVIEW  HEPARIN LEVEL (UNFRACTIONATED)  HIV ANTIBODY (ROUTINE TESTING W REFLEX)  SODIUM, URINE, RANDOM  CREATININE, URINE, RANDOM  CBG MONITORING, ED    EKG EKG Interpretation  Date/Time:  Monday December 30 2021 22:29:20 EST Ventricular Rate:  87 PR Interval:  179 QRS Duration: 109 QT Interval:  356 QTC Calculation: 429 R Axis:   123 Text Interpretation: Sinus rhythm Ventricular premature complex Right axis deviation Low voltage, precordial leads ST elevation, consider inferior injury No STEMI Confirmed by Thayer Jew [phone]) on 12/31/2021 1:47:33 AM  Radiology DG Chest 2 View  Result Date: 12/30/2021 CLINICAL DATA:  Altered mental status, slurred speech EXAM: CHEST - 2 VIEW COMPARISON:  08/28/2021 FINDINGS: Cardiomegaly. Vascular congestion. Aortic atherosclerosis. No  confluent opacity, effusion or edema. No acute bony abnormality. IMPRESSION: Cardiomegaly, vascular congestion. Electronically Signed   By: Rolm Baptise M.D.   On: 12/30/2021 23:25   CT HEAD WO CONTRAST  Result Date: 12/30/2021 CLINICAL DATA:  Neuro deficit, acute, stroke suspected dysarthria, weakness EXAM: CT HEAD WITHOUT CONTRAST TECHNIQUE: Contiguous axial images were obtained from the base of the skull through the vertex without intravenous contrast. RADIATION DOSE REDUCTION: This exam was performed according to the departmental dose-optimization program which includes automated exposure control, adjustment of the mA and/or kV according to patient size and/or use of iterative reconstruction technique. COMPARISON:  08/05/2018 FINDINGS: Brain: Old left cerebellar infarct. No acute intracranial abnormality. Specifically, no hemorrhage, hydrocephalus, mass lesion, acute infarction, or significant intracranial injury. Vascular: No hyperdense vessel or unexpected calcification. Skull: No acute calvarial abnormality. Sinuses/Orbits: No acute findings Other: None IMPRESSION: Old left cerebellar infarct. No acute intracranial abnormality. Electronically Signed   By: Rolm Baptise M.D.   On: 12/30/2021 23:26    Procedures .Critical Care Performed by: Antonietta Breach, PA-C Authorized by: Antonietta Breach, PA-C   Critical care provider statement:    Critical care time (minutes):  75   Critical care was necessary to treat or prevent imminent or life-threatening deterioration of the following conditions:  Respiratory failure, metabolic crisis, renal failure and cardiac failure   Critical care was time spent personally by me on the following activities:  Development of treatment plan with patient or surrogate, discussions with consultants, evaluation of patient's response to treatment, examination of patient, ordering and review of laboratory studies, ordering and review of radiographic studies, ordering and performing  treatments and interventions, pulse oximetry, re-evaluation of patient's condition, review of old charts and obtaining history from patient or surrogate   I assumed direction of critical  care for this patient from another provider in my specialty: no     Care discussed with: admitting provider      Medications Ordered in ED Medications  heparin ADULT infusion 100 units/mL (25000 units/28mL) (1,100 Units/hr Intravenous New Bag/Given 12/31/21 0346)  acetaminophen (TYLENOL) tablet 650 mg (has no administration in time range)    Or  acetaminophen (TYLENOL) suppository 650 mg (has no administration in time range)  heparin bolus via infusion 2,000 Units (2,000 Units Intravenous Bolus from Bag 12/31/21 0347)    ED Course/ Medical Decision Making/ A&P Clinical Course as of 12/31/21 0626  Mon Dec 30, 2021  2309 Patient's chart was reviewed.  It appears that she was seen by her primary care doctor 4 days ago in the office.  During this routine follow-up she was noted to be hypoxic with oxygen saturations of 80% on room air.  This improved with supplemental oxygen and EMS was called to transport the patient to the hospital for further evaluation of acute hypoxemia.  Patient states that her oxygen was improved in the ambulance and she declined to be transferred to the hospital at this time.  She has no history of chronic oxygen requirement.  While her hands and fingers are cold to touch, her sats have been noted to be persistently in the upper 70s/low 80s.  Her hypoxia may be related to her speech changes and overall somnolence.  Placed on 2 L via nasal cannula with improvement in oxygen saturations.  Chest x-ray added as well as ABG.  Patient does not appear overtly fluid overloaded.  She does not have significant bilateral lower extremity pitting edema.  Most recent echocardiogram reviewed with EF of 35 to 40% in September 2022. [KH]  Tue Dec 31, 2021  0000 CT head negative for acute intracranial  abnormality. Old CVA noted. Do not feel speech changes to be related to acute CVA; no focal deficits noted on exam.   Noted to have a respiratory acidosis of 7.137 with pCO2 of 62.3. BiPAP was ordered by MD Zavitz. [KH]  [phone] Patient resting in exam room bed, but tolerating BiPAP well. [KH]  0145 Troponin resulted at 5,916.  [KH]  0208 Troponin elevation may all be demand-related in the setting potential worsening CHF exacerbation coupled with new ARF.   However, patient placed on heparin given NSTEMI; unable to completely exclude ACS though EKG is largely unchanged. PE is also a possibility, though patient is not tachycardic. She denied any chest pain leading up to her presentation to the ED today. Unable to scan due to creatinine. [KH]  0218 Dr. Humphrey Rolls of cardiology consulted regarding patient case. Will place note in chart with recommendations, but feels echocardiogram in the AM is appropriate. Agree with continuation of heparin pending further work up. [KH]  [phone] Spoke with Dr. Marlowe Sax of [facility] who will assess the patient in the ED for admission. [KH]  [phone] Case discussed with Dr. Cheral Marker of Neurology at the request of the hospitalist. Upon further discussion with Dr. Cheral Marker, he feels that it is likely that the patient's speech is related to her hypoxia rather than an acute CVA given absence of other focal deficits. Dr. Cheral Marker states he is happy to discuss the patient's case further with the hospitalist, if desired. Dr. Marlowe Sax updated via Epic secure chat. [KH]  [phone] Spoke with daughter in [place] to update her on status of mother's work up and plan for admission. [BP]    Clinical Course User Index [KH] Antonietta Breach,  PA-C                           Medical Decision Making Amount and/or Complexity of Data Reviewed Labs: ordered. Radiology: ordered.  Risk Prescription drug management. Decision regarding hospitalization.   Patient admitted to the hospitalist service for management of acute  respiratory failure with hypoxia and hypercapnia.  This appears to be secondary to CHF exacerbation with associated NSTEMI.  Given degree of troponin elevation with inability to exclude ACS, patient was started on IV heparin.  Cardiology to consult during inpatient course.  Currently recommend echocardiogram in the morning.  Patient also with new acute renal failure.  Much of her CHF exacerbation has been managed with BiPAP given her labile blood pressure and new acute kidney injury.  Likely to require hydration in addition to diuresis while inpatient, modification of renally cleared medications.  Case discussed with Dr. Marlowe Sax of [facility] who will assess in the ED for admission.        Final Clinical Impression(s) / ED Diagnoses Final diagnoses:  Acute respiratory failure with hypoxia and hypercapnia (HCC)  Acute on chronic congestive heart failure, unspecified heart failure type ([place])  NSTEMI (non-ST elevated myocardial infarction) ([place])  Acute renal failure, unspecified acute renal failure type [facility])    Rx / DC Orders ED Discharge Orders     None         Antonietta Breach, PA-C 12/31/21 1610    Merryl Hacker, MD 12/31/21 2306

## 2021-12-31 ENCOUNTER — Inpatient Hospital Stay (HOSPITAL_COMMUNITY): Payer: Medicare HMO

## 2021-12-31 DIAGNOSIS — J9601 Acute respiratory failure with hypoxia: Secondary | ICD-10-CM | POA: Diagnosis not present

## 2021-12-31 DIAGNOSIS — I5043 Acute on chronic combined systolic (congestive) and diastolic (congestive) heart failure: Secondary | ICD-10-CM

## 2021-12-31 DIAGNOSIS — I509 Heart failure, unspecified: Secondary | ICD-10-CM | POA: Diagnosis not present

## 2021-12-31 DIAGNOSIS — M069 Rheumatoid arthritis, unspecified: Secondary | ICD-10-CM | POA: Diagnosis present

## 2021-12-31 DIAGNOSIS — I482 Chronic atrial fibrillation, unspecified: Secondary | ICD-10-CM | POA: Diagnosis present

## 2021-12-31 DIAGNOSIS — D631 Anemia in chronic kidney disease: Secondary | ICD-10-CM | POA: Diagnosis present

## 2021-12-31 DIAGNOSIS — I214 Non-ST elevation (NSTEMI) myocardial infarction: Secondary | ICD-10-CM | POA: Diagnosis present

## 2021-12-31 DIAGNOSIS — I471 Supraventricular tachycardia: Secondary | ICD-10-CM | POA: Diagnosis present

## 2021-12-31 DIAGNOSIS — I639 Cerebral infarction, unspecified: Secondary | ICD-10-CM | POA: Diagnosis not present

## 2021-12-31 DIAGNOSIS — I272 Pulmonary hypertension, unspecified: Secondary | ICD-10-CM | POA: Diagnosis present

## 2021-12-31 DIAGNOSIS — I2510 Atherosclerotic heart disease of native coronary artery without angina pectoris: Secondary | ICD-10-CM | POA: Diagnosis not present

## 2021-12-31 DIAGNOSIS — D696 Thrombocytopenia, unspecified: Secondary | ICD-10-CM | POA: Insufficient documentation

## 2021-12-31 DIAGNOSIS — Z6841 Body Mass Index (BMI) 40.0 and over, adult: Secondary | ICD-10-CM | POA: Diagnosis not present

## 2021-12-31 DIAGNOSIS — Y846 Urinary catheterization as the cause of abnormal reaction of the patient, or of later complication, without mention of misadventure at the time of the procedure: Secondary | ICD-10-CM | POA: Diagnosis present

## 2021-12-31 DIAGNOSIS — I480 Paroxysmal atrial fibrillation: Secondary | ICD-10-CM | POA: Diagnosis not present

## 2021-12-31 DIAGNOSIS — R778 Other specified abnormalities of plasma proteins: Secondary | ICD-10-CM

## 2021-12-31 DIAGNOSIS — I517 Cardiomegaly: Secondary | ICD-10-CM | POA: Diagnosis not present

## 2021-12-31 DIAGNOSIS — D649 Anemia, unspecified: Secondary | ICD-10-CM | POA: Diagnosis not present

## 2021-12-31 DIAGNOSIS — I130 Hypertensive heart and chronic kidney disease with heart failure and stage 1 through stage 4 chronic kidney disease, or unspecified chronic kidney disease: Secondary | ICD-10-CM | POA: Diagnosis present

## 2021-12-31 DIAGNOSIS — D72829 Elevated white blood cell count, unspecified: Secondary | ICD-10-CM | POA: Insufficient documentation

## 2021-12-31 DIAGNOSIS — I5021 Acute systolic (congestive) heart failure: Secondary | ICD-10-CM | POA: Diagnosis not present

## 2021-12-31 DIAGNOSIS — R4781 Slurred speech: Secondary | ICD-10-CM | POA: Insufficient documentation

## 2021-12-31 DIAGNOSIS — N179 Acute kidney failure, unspecified: Secondary | ICD-10-CM | POA: Diagnosis not present

## 2021-12-31 DIAGNOSIS — I25110 Atherosclerotic heart disease of native coronary artery with unstable angina pectoris: Secondary | ICD-10-CM | POA: Diagnosis present

## 2021-12-31 DIAGNOSIS — N281 Cyst of kidney, acquired: Secondary | ICD-10-CM | POA: Diagnosis not present

## 2021-12-31 DIAGNOSIS — J9602 Acute respiratory failure with hypercapnia: Secondary | ICD-10-CM

## 2021-12-31 DIAGNOSIS — N1832 Chronic kidney disease, stage 3b: Secondary | ICD-10-CM | POA: Diagnosis present

## 2021-12-31 DIAGNOSIS — N390 Urinary tract infection, site not specified: Secondary | ICD-10-CM | POA: Diagnosis not present

## 2021-12-31 DIAGNOSIS — J449 Chronic obstructive pulmonary disease, unspecified: Secondary | ICD-10-CM | POA: Diagnosis present

## 2021-12-31 DIAGNOSIS — Z515 Encounter for palliative care: Secondary | ICD-10-CM | POA: Diagnosis not present

## 2021-12-31 DIAGNOSIS — T83511A Infection and inflammatory reaction due to indwelling urethral catheter, initial encounter: Secondary | ICD-10-CM | POA: Diagnosis present

## 2021-12-31 DIAGNOSIS — Z20822 Contact with and (suspected) exposure to covid-19: Secondary | ICD-10-CM | POA: Diagnosis present

## 2021-12-31 DIAGNOSIS — I779 Disorder of arteries and arterioles, unspecified: Secondary | ICD-10-CM | POA: Diagnosis not present

## 2021-12-31 DIAGNOSIS — N170 Acute kidney failure with tubular necrosis: Secondary | ICD-10-CM | POA: Diagnosis not present

## 2021-12-31 DIAGNOSIS — D6959 Other secondary thrombocytopenia: Secondary | ICD-10-CM | POA: Diagnosis present

## 2021-12-31 DIAGNOSIS — E861 Hypovolemia: Secondary | ICD-10-CM | POA: Diagnosis present

## 2021-12-31 DIAGNOSIS — I5042 Chronic combined systolic (congestive) and diastolic (congestive) heart failure: Secondary | ICD-10-CM | POA: Diagnosis not present

## 2021-12-31 DIAGNOSIS — I081 Rheumatic disorders of both mitral and tricuspid valves: Secondary | ICD-10-CM | POA: Diagnosis present

## 2021-12-31 DIAGNOSIS — N183 Chronic kidney disease, stage 3 unspecified: Secondary | ICD-10-CM | POA: Diagnosis not present

## 2021-12-31 DIAGNOSIS — G9341 Metabolic encephalopathy: Secondary | ICD-10-CM | POA: Diagnosis present

## 2021-12-31 DIAGNOSIS — I63443 Cerebral infarction due to embolism of bilateral cerebellar arteries: Secondary | ICD-10-CM | POA: Diagnosis present

## 2021-12-31 LAB — URINALYSIS, ROUTINE W REFLEX MICROSCOPIC
Bilirubin Urine: NEGATIVE
Glucose, UA: NEGATIVE mg/dL
Hgb urine dipstick: NEGATIVE
Ketones, ur: NEGATIVE mg/dL
Nitrite: NEGATIVE
Protein, ur: >=300 mg/dL — AB
Specific Gravity, Urine: 1.013 (ref 1.005–1.030)
WBC, UA: >50 WBC/hpf — ABNORMAL HIGH (ref 0–5)
pH: 6 (ref 5.0–8.0)

## 2021-12-31 LAB — CBC
HCT: 41.9 % (ref 36.0–46.0)
Hemoglobin: 12.8 g/dL (ref 12.0–15.0)
MCH: 28.4 pg (ref 26.0–34.0)
MCHC: 30.5 g/dL (ref 30.0–36.0)
MCV: 93.1 fL (ref 80.0–100.0)
Platelets: 98 10*3/uL — ABNORMAL LOW (ref 150–400)
RBC: 4.5 MIL/uL (ref 3.87–5.11)
RDW: 15.2 % (ref 11.5–15.5)
WBC: 12.9 10*3/uL — ABNORMAL HIGH (ref 4.0–10.5)
nRBC: 6.7 % — ABNORMAL HIGH (ref 0.0–0.2)

## 2021-12-31 LAB — I-STAT ARTERIAL BLOOD GAS, ED
Acid-base deficit: 7 mmol/L — ABNORMAL HIGH (ref 0.0–2.0)
Bicarbonate: 22 mmol/L (ref 20.0–28.0)
Calcium, Ion: 1.07 mmol/L — ABNORMAL LOW (ref 1.15–1.40)
HCT: 39 % (ref 36.0–46.0)
Hemoglobin: 13.3 g/dL (ref 12.0–15.0)
O2 Saturation: 97 %
Potassium: 4.6 mmol/L (ref 3.5–5.1)
Sodium: 135 mmol/L (ref 135–145)
TCO2: 24 mmol/L (ref 22–32)
pCO2 arterial: 60.2 mmHg — ABNORMAL HIGH (ref 32.0–48.0)
pH, Arterial: 7.17 — CL (ref 7.350–7.450)
pO2, Arterial: 113 mmHg — ABNORMAL HIGH (ref 83.0–108.0)

## 2021-12-31 LAB — POCT I-STAT 7, (LYTES, BLD GAS, ICA,H+H)
Acid-base deficit: 4 mmol/L — ABNORMAL HIGH (ref 0.0–2.0)
Bicarbonate: 24.1 mmol/L (ref 20.0–28.0)
Calcium, Ion: 1.03 mmol/L — ABNORMAL LOW (ref 1.15–1.40)
HCT: 40 % (ref 36.0–46.0)
Hemoglobin: 13.6 g/dL (ref 12.0–15.0)
O2 Saturation: 96 %
Patient temperature: 98.4
Potassium: 4.3 mmol/L (ref 3.5–5.1)
Sodium: 138 mmol/L (ref 135–145)
TCO2: 26 mmol/L (ref 22–32)
pCO2 arterial: 57.6 mmHg — ABNORMAL HIGH (ref 32.0–48.0)
pH, Arterial: 7.228 — ABNORMAL LOW (ref 7.350–7.450)
pO2, Arterial: 103 mmHg (ref 83.0–108.0)

## 2021-12-31 LAB — GLUCOSE, CAPILLARY
Glucose-Capillary: 75 mg/dL (ref 70–99)
Glucose-Capillary: 79 mg/dL (ref 70–99)
Glucose-Capillary: 70 mg/dL (ref 70–99)
Glucose-Capillary: 75 mg/dL (ref 70–99)

## 2021-12-31 LAB — PHOSPHORUS: Phosphorus: 7.3 mg/dL — ABNORMAL HIGH (ref 2.5–4.6)

## 2021-12-31 LAB — COMPREHENSIVE METABOLIC PANEL
ALT: 28 U/L (ref 0–44)
AST: 41 U/L (ref 15–41)
Albumin: 3 g/dL — ABNORMAL LOW (ref 3.5–5.0)
Alkaline Phosphatase: 151 U/L — ABNORMAL HIGH (ref 38–126)
Anion gap: 16 — ABNORMAL HIGH (ref 5–15)
BUN: 128 mg/dL — ABNORMAL HIGH (ref 8–23)
CO2: 16 mmol/L — ABNORMAL LOW (ref 22–32)
Calcium: 8.6 mg/dL — ABNORMAL LOW (ref 8.9–10.3)
Chloride: 104 mmol/L (ref 98–111)
Creatinine, Ser: 4.95 mg/dL — ABNORMAL HIGH (ref 0.44–1.00)
GFR, Estimated: 9 mL/min — ABNORMAL LOW (ref >60)
Glucose, Bld: 97 mg/dL (ref 70–99)
Potassium: 4.5 mmol/L (ref 3.5–5.1)
Sodium: 136 mmol/L (ref 135–145)
Total Bilirubin: 1.3 mg/dL — ABNORMAL HIGH (ref 0.3–1.2)
Total Protein: 6 g/dL — ABNORMAL LOW (ref 6.5–8.1)

## 2021-12-31 LAB — I-STAT CHEM 8, ED
BUN: >130 mg/dL — ABNORMAL HIGH (ref 8–23)
Calcium, Ion: 1.03 mmol/L — ABNORMAL LOW (ref 1.15–1.40)
Chloride: 104 mmol/L (ref 98–111)
Creatinine, Ser: 4.9 mg/dL — ABNORMAL HIGH (ref 0.44–1.00)
Glucose, Bld: 92 mg/dL (ref 70–99)
HCT: 43 % (ref 36.0–46.0)
Hemoglobin: 14.6 g/dL (ref 12.0–15.0)
Potassium: 4.4 mmol/L (ref 3.5–5.1)
Sodium: 135 mmol/L (ref 135–145)
TCO2: 24 mmol/L (ref 22–32)

## 2021-12-31 LAB — DIFFERENTIAL
Abs Immature Granulocytes: 0.12 10*3/uL — ABNORMAL HIGH (ref 0.00–0.07)
Basophils Absolute: 0 10*3/uL (ref 0.0–0.1)
Basophils Relative: 0 %
Eosinophils Absolute: 0 10*3/uL (ref 0.0–0.5)
Eosinophils Relative: 0 %
Immature Granulocytes: 1 %
Lymphocytes Relative: 3 %
Lymphs Abs: 0.4 10*3/uL — ABNORMAL LOW (ref 0.7–4.0)
Monocytes Absolute: 0.9 10*3/uL (ref 0.1–1.0)
Monocytes Relative: 7 %
Neutro Abs: 11.5 10*3/uL — ABNORMAL HIGH (ref 1.7–7.7)
Neutrophils Relative %: 89 %

## 2021-12-31 LAB — RAPID URINE DRUG SCREEN, HOSP PERFORMED
Amphetamines: NOT DETECTED
Barbiturates: NOT DETECTED
Benzodiazepines: NOT DETECTED
Cocaine: NOT DETECTED
Opiates: NOT DETECTED
Tetrahydrocannabinol: NOT DETECTED

## 2021-12-31 LAB — ECHOCARDIOGRAM COMPLETE
AR max vel: 1.64 cm2
AV Area VTI: 1.44 cm2
AV Area mean vel: 1.44 cm2
AV Mean grad: 10.5 mmHg
AV Peak grad: 18.4 mmHg
Ao pk vel: 2.15 m/s
Area-P 1/2: 2.66 cm2
Height: 64 in
S' Lateral: 3.8 cm
Weight: 3488.56 oz

## 2021-12-31 LAB — BRAIN NATRIURETIC PEPTIDE: B Natriuretic Peptide: >4500 pg/mL — ABNORMAL HIGH (ref 0.0–100.0)

## 2021-12-31 LAB — RESP PANEL BY RT-PCR (FLU A&B, COVID) ARPGX2
Influenza A by PCR: NEGATIVE
Influenza B by PCR: NEGATIVE
SARS Coronavirus 2 by RT PCR: NEGATIVE

## 2021-12-31 LAB — ETHANOL: Alcohol, Ethyl (B): <10 mg/dL (ref <10)

## 2021-12-31 LAB — HIV ANTIBODY (ROUTINE TESTING W REFLEX): HIV Screen 4th Generation wRfx: NONREACTIVE

## 2021-12-31 LAB — BASIC METABOLIC PANEL
Anion gap: 14 (ref 5–15)
BUN: 130 mg/dL — ABNORMAL HIGH (ref 8–23)
CO2: 21 mmol/L — ABNORMAL LOW (ref 22–32)
Calcium: 8.2 mg/dL — ABNORMAL LOW (ref 8.9–10.3)
Chloride: 102 mmol/L (ref 98–111)
Creatinine, Ser: 5.03 mg/dL — ABNORMAL HIGH (ref 0.44–1.00)
GFR, Estimated: 9 mL/min — ABNORMAL LOW (ref >60)
Glucose, Bld: 76 mg/dL (ref 70–99)
Potassium: 4.6 mmol/L (ref 3.5–5.1)
Sodium: 137 mmol/L (ref 135–145)

## 2021-12-31 LAB — PROTIME-INR
INR: 1.5 — ABNORMAL HIGH (ref 0.8–1.2)
Prothrombin Time: 17.6 seconds — ABNORMAL HIGH (ref 11.4–15.2)

## 2021-12-31 LAB — CREATININE, URINE, RANDOM: Creatinine, Urine: 234.53 mg/dL

## 2021-12-31 LAB — HEPARIN LEVEL (UNFRACTIONATED)
Heparin Unfractionated: <0.1 IU/mL — ABNORMAL LOW (ref 0.30–0.70)
Heparin Unfractionated: 0.1 IU/mL — ABNORMAL LOW (ref 0.30–0.70)

## 2021-12-31 LAB — MRSA NEXT GEN BY PCR, NASAL: MRSA by PCR Next Gen: NOT DETECTED

## 2021-12-31 LAB — MAGNESIUM: Magnesium: 2.8 mg/dL — ABNORMAL HIGH (ref 1.7–2.4)

## 2021-12-31 LAB — LACTIC ACID, PLASMA: Lactic Acid, Venous: 1.4 mmol/L (ref 0.5–1.9)

## 2021-12-31 LAB — APTT: aPTT: 29 seconds (ref 24–36)

## 2021-12-31 LAB — TROPONIN I (HIGH SENSITIVITY)
Troponin I (High Sensitivity): 5916 ng/L (ref <18)
Troponin I (High Sensitivity): 5616 ng/L (ref <18)

## 2021-12-31 LAB — SODIUM, URINE, RANDOM: Sodium, Ur: 12 mmol/L

## 2021-12-31 LAB — SAVE SMEAR(SSMR), FOR PROVIDER SLIDE REVIEW

## 2021-12-31 MED ORDER — HEPARIN BOLUS VIA INFUSION
2000.0000 [IU] | Freq: Once | INTRAVENOUS | Status: AC
Start: 2021-12-31 — End: 2021-12-31
  Administered 2021-12-31: 2000 [IU] via INTRAVENOUS
  Filled 2021-12-31: qty 2000

## 2021-12-31 MED ORDER — HEPARIN (PORCINE) 25000 UT/250ML-% IV SOLN
1600.0000 [IU]/h | INTRAVENOUS | Status: DC
  Administered 2021-12-31: 1350 [IU]/h via INTRAVENOUS
  Administered 2021-12-31: 1100 [IU]/h via INTRAVENOUS
  Filled 2021-12-31 (×2): qty 250

## 2021-12-31 MED ORDER — STERILE WATER FOR INJECTION IV SOLN
INTRAVENOUS | Status: DC
  Filled 2021-12-31 (×3): qty 1000

## 2021-12-31 MED ORDER — CHLORHEXIDINE GLUCONATE CLOTH 2 % EX PADS
6.0000 | MEDICATED_PAD | Freq: Every day | CUTANEOUS | Status: DC
  Administered 2021-12-31 – 2022-01-18 (×18): 6 via TOPICAL

## 2021-12-31 MED ORDER — ORAL CARE MOUTH RINSE
15.0000 mL | Freq: Two times a day (BID) | OROMUCOSAL | Status: DC
  Administered 2021-12-31 – 2022-01-09 (×6): 15 mL via OROMUCOSAL

## 2021-12-31 MED ORDER — ACETAMINOPHEN 325 MG PO TABS
650.0000 mg | ORAL_TABLET | Freq: Four times a day (QID) | ORAL | Status: DC | PRN
  Administered 2022-01-06 – 2022-01-13 (×3): 650 mg via ORAL
  Filled 2021-12-31 (×3): qty 2

## 2021-12-31 MED ORDER — ACETAMINOPHEN 650 MG RE SUPP: 650.0000 mg | Freq: Four times a day (QID) | RECTAL | Status: DC | PRN

## 2021-12-31 MED ORDER — CHLORHEXIDINE GLUCONATE 0.12 % MT SOLN
15.0000 mL | Freq: Two times a day (BID) | OROMUCOSAL | Status: DC
  Administered 2021-12-31 – 2022-01-18 (×19): 15 mL via OROMUCOSAL
  Filled 2021-12-31 (×24): qty 15

## 2021-12-31 MED ORDER — SODIUM BICARBONATE 8.4 % IV SOLN
100.0000 meq | Freq: Once | INTRAVENOUS | Status: AC
  Administered 2021-12-31: 100 meq via INTRAVENOUS
  Filled 2021-12-31: qty 50

## 2021-12-31 NOTE — Assessment & Plan Note (Deleted)
Blood ethanol level undetectable.  CT head negative for acute finding.  Unable to assess the patient's speech at this time as she is on BiPAP, no other focal neurodeficit.  Neurology felt that this was likely related to hypoxia rather than acute CVA. The patient is speaking today, although speech is difficult to understand. Per nursing the patient is irascible and sometimes confused.  MRI brain demonstrates scattered tiny acute infarct. Likely from an embolic source. I have started the patient on eliquis and consulted neurology.

## 2021-12-31 NOTE — Progress Notes (Signed)
ANTICOAGULATION CONSULT NOTE - Follow Up Consult  Pharmacy Consult for IV Heparin Indication: chest pain/ACS  Allergies  Allergen Reactions   Ace Inhibitors Palpitations    Patient Measurements: Height: 5\' 4"  (162.6 cm) Weight: 98.9 kg (218 lb 0.6 oz) IBW/kg (Calculated) : 54.7 Heparin Dosing Weight: 77.5 kg  Vital Signs: Temp: 97.7 F (36.5 C) (02/07 1900) Temp Source: Oral (02/07 1900) BP: 90/53 (02/07 1900) Pulse Rate: 65 (02/07 1700)  Labs: Recent Labs    12/30/21 2342 12/30/21 2348 12/31/21 0005 12/31/21 0215 12/31/21 0827 12/31/21 1052 12/31/21 1158 12/31/21 1610 12/31/21 2106  HGB 12.8   < > 14.6  --  13.3  --  13.6  --   --   HCT 41.9   < > 43.0  --  39.0  --  40.0  --   --   PLT 98*  --   --   --   --   --   --   --   --   APTT 29  --   --   --   --   --   --   --   --   LABPROT 17.6*  --   --   --   --   --   --   --   --   INR 1.5*  --   --   --   --   --   --   --   --   HEPARINUNFRC  --   --   --   --   --  <0.10*  --   --  0.10*  CREATININE 4.95*  --  4.90*  --   --   --   --  5.03*  --   TROPONINIHS 5,916*  --   --  5,616*  --   --   --   --   --    < > = values in this interval not displayed.     Estimated Creatinine Clearance: 12.2 mL/min (A) (by C-G formula based on SCr of 5.03 mg/dL (H)).   Medications:  Infusions:   heparin 1,350 Units/hr (12/31/21 2153)    sodium bicarbonate (isotonic) infusion in sterile water 100 mL/hr at 12/31/21 1900    Assessment: 71 year of age female with history of CVA who is on IV Heparin therapy for ACS. Patient presented with concern of CVA. CT Head was negative for acute change (old infarct noted). No MRI available.   Heparin level increased to 0.1 but remains subtherapeutic tonight after rate increase earlier this afternoon. No bleeding or issues with infusion per discussion with RN. CBC is stable. AKI noted- SCr up to 5.03 (baseline ~1.7).   Goal of Therapy:  Heparin level 0.3 to 0.5 units/ml Monitor  platelets by anticoagulation protocol: Yes   Plan:  No bolus. Increase Heparin to 1600 units/hr.  Recheck Heparin level in 8 hours.  Monitor daily heparin level and CBC, s/sx bleeding   Arturo Morton, PharmD, BCPS Please check AMION for all [place] contact numbers Clinical Pharmacist 12/31/2021 10:06 PM

## 2021-12-31 NOTE — Assessment & Plan Note (Addendum)
NSTEMI CAD in native artery, s/p cardiac cath with non obstructive CAD- (present on admission)  Patient was treated medically with IV heparin No further chest pain.

## 2021-12-31 NOTE — Assessment & Plan Note (Deleted)
Resolved.  Mild and likely reactive.  Chest x-ray not suggestive of pneumonia. -UA consistent with UTI. Urine culture has grown out Klebsiella pneumoniae and Serratia Marcescens. Both are sensitive to Rocephin.

## 2021-12-31 NOTE — Consult Note (Addendum)
Patricia Wagner Admit Date: 12/30/2021 12/31/2021 Patricia Wagner Requesting Physician:  Dr. Tacy Learn  Reason for Consult:  Progressive CKD  HPI:    71 y/o female with a PMH chronic systolic CHF, A-fib not on Eliquis due to history of diverticular GI bleeding, PVCs, CKD stage IIIb, chronic systolic CHF due to nonischemic cardiomyopathy (EF 35 to 40%), rheumatoid arthritis, Raynaud's disease, CVA, COPD, hypertension, hyperlipidemia. She was evaluated by her PCP 5 days prior to presentation for diarrhea and had a near syncopal event in the lobby. She was found to be hypoxic at the office and EMS was called, but she declined. She presented to the ED yesterday for slurred speech,and right arm weakness. She was hypoxic to the 70-80s and was placed on BiPAP after an ABG showed a pH of 7.1, PCO2 of 62, and a PO2 of 79. Pressures were soft and initial labs showed a leukocytosis of 12.9, thrombocytopenia with a platelet count of 98k, bicarb of 16, BUN of 128, sCr of 4.9 (BL of 1.3-1.5). U/A showing large amounts of leukocytes, >50 WBC, and many bacteria with negative nitrites.   Nephrology was consulted for her AKI on CKD stage IIIb.   On evaluation, patient is currently on BiPAP she is answering questions by shaking and nodding her head. She denies new medications. Per bedside nurse she had a "mucous" colored stool x1.     Creat (mg/dL)  Date Value  10/15/2021 1.78 (H)  07/11/2021 1.31 (H)  02/01/2021 1.37 (H)  01/02/2021 1.38 (H)  10/02/2020 1.90 (H)  08/09/2020 1.56 (H)  01/20/2017 1.57 (H)  12/19/2016 1.23 (H)  10/28/2016 1.24 (H)   Creatinine, Ser (mg/dL)  Date Value  12/31/2021 4.90 (H)  12/30/2021 4.95 (H)  05/22/2021 1.45 (H)  04/17/2021 1.63 (H)  02/01/2021 1.36 (H)  05/29/2020 1.59 (H)  05/18/2020 1.17 (H)  05/17/2020 1.58 (H)  05/16/2020 1.40 (H)  05/15/2020 1.69 (H)  No intake/output data recorded. Total I/O In: 96 [I.V.:96] Out: -    Balance of 12 systems is negative w/  exceptions as above  PMH  Past Medical History:  Diagnosis Date   Acute blood loss anemia 03/05/2018   Acute hypercapnic respiratory failure ([place]) 04/30/2017   Acute renal failure superimposed on chronic kidney disease ([place]) 10/20/2016   Arthritis    hands, knees   Atrial fibrillation ([place])    a. s/p multiple cardioversions; failed tikosyn/sotalol.   Bradycardia 11/20/2016   CAD in native artery, s/p cardiac cath with non obstructive CAD 10/24/2016   Cerebrovascular accident (CVA) due to embolism of left cerebellar artery (HCC)    CHF (congestive heart failure) ([place])    Chronic anticoagulation 03/15/2018   Chronic atrial fibrillation ([place]) 05/19/9484   Chronic systolic (congestive) heart failure ([place]) 03/05/2018   CKD (chronic kidney disease) stage 3, GFR 30-59 ml/min (HCC)    Coagulopathy ([place]) 03/05/2018   DIVERTICULITIS, HX OF 07/25/2007   Diverticulosis of colon with hemorrhage 07/22/2007   Qualifier: Diagnosis of  By: [name]     DIVERTICULOSIS, COLON 07/22/2007   Dysuria 07/21/2017   Edema, peripheral    a. chronic BLE edema, R>L. Prior trauma from dog attack and accident.   Essential hypertension 07/22/2007   Qualifier: Diagnosis of  By: [name]     History of kidney stones    HLD (hyperlipidemia) 02/03/2008   Qualifier: Diagnosis of  By: [name], James W    HYPERLIPIDEMIA 02/03/2008   Hypersomnia    declines w/u   Hypertension  Hypotension (arterial) 04/30/2017   MENOPAUSAL DISORDER 01/09/2011   Morbid obesity ([place]) 07/22/2007   NICM (nonischemic cardiomyopathy) ([place]) 10/24/2016   PAF (paroxysmal atrial fibrillation) ([place])    Presence of indwelling urinary catheter    supra pubic cath   Raynaud's syndrome 07/22/2007   Stroke ([place]) 2017   Suspected sleep apnea 05/20/2017   THYROID NODULE, RIGHT 01/04/2010   VITAMIN D DEFICIENCY 01/09/2011   Qualifier: Diagnosis of  By: [name], [name]    [facility]  Past Surgical History:  Procedure Laterality Date   ATRIAL  FIBRILLATION ABLATION N/A 10/13/2019   Procedure: ATRIAL FIBRILLATION ABLATION;  Surgeon: [name], MD;  Location: [place] CV LAB;  Service: Cardiovascular;  Laterality: N/A;   CARDIAC CATHETERIZATION N/A 10/23/2016   Procedure: Left Heart Cath and Coronary Angiography;  Surgeon: [name], MD;  Location: [place] [facility];  Service: Cardiovascular;  Laterality: N/A;   CARDIOVERSION N/A 10/31/2016   Procedure: CARDIOVERSION;  Surgeon: [name], MD;  Location: [place];  Service: Cardiovascular;  Laterality: N/A;   CARDIOVERSION N/A 11/03/2016   Procedure: CARDIOVERSION;  Surgeon: [name], MD;  Location: [place];  Service: Cardiovascular;  Laterality: N/A;   CARDIOVERSION N/A 11/18/2016   Procedure: CARDIOVERSION;  Surgeon: [name], MD;  Location: [facility] ENDOSCOPY;  Service: Cardiovascular;  Laterality: N/A;   CARDIOVERSION N/A 03/25/2017   Procedure: CARDIOVERSION;  Surgeon: [name], MD;  Location: [facility] ENDOSCOPY;  Service: Cardiovascular;  Laterality: N/A;   CARDIOVERSION N/A 05/06/2017   Procedure: CARDIOVERSION;  Surgeon: [name], MD;  Location: [facility] ENDOSCOPY;  Service: Cardiovascular;  Laterality: N/A;   CARDIOVERSION N/A 05/12/2017   Procedure: CARDIOVERSION;  Surgeon: [name], MD;  Location: [facility] ENDOSCOPY;  Service: Cardiovascular;  Laterality: N/A;   CHOLECYSTECTOMY     COLONOSCOPY N/A 12/04/2017   Procedure: COLONOSCOPY;  Surgeon: [name], MD;  Location: [facility] ENDOSCOPY;  Service: Endoscopy;  Laterality: N/A;   COLONOSCOPY     COLONOSCOPY W/ POLYPECTOMY  02/2011   pan diverticulosis.  tubular adenoma without dysplasia on 5 mm sigmoid polyp.  Dr Fuller Plan.     ESOPHAGOGASTRODUODENOSCOPY (EGD) WITH PROPOFOL N/A 05/16/2020   Procedure: ESOPHAGOGASTRODUODENOSCOPY (EGD) WITH PROPOFOL;  Surgeon: Milus Banister, MD;  Location: [facility] ENDOSCOPY;  Service: Endoscopy;  Laterality: N/A;   FLEXIBLE SIGMOIDOSCOPY N/A  01/18/2020   Procedure: FLEXIBLE SIGMOIDOSCOPY;  Surgeon: [name], DO;  Location: [place];  Service: Gastroenterology;  Laterality: N/A;   HEMOSTASIS CLIP PLACEMENT  01/18/2020   Procedure: HEMOSTASIS CLIP PLACEMENT;  Surgeon: [name], DO;  Location: [place];  Service: Gastroenterology;;   HOT HEMOSTASIS N/A 05/16/2020   Procedure: HOT HEMOSTASIS (ARGON PLASMA COAGULATION/BICAP);  Surgeon: Milus Banister, MD;  Location: [facility];  Service: Endoscopy;  Laterality: N/A;   PARTIAL HYSTERECTOMY     1 OVARY LEFT   SCLEROTHERAPY  01/18/2020   Procedure: SCLEROTHERAPY;  Surgeon: [name], DO;  Location: [place] ENDOSCOPY;  Service: Gastroenterology;;   TEE WITHOUT CARDIOVERSION N/A 10/31/2016   Procedure: TRANSESOPHAGEAL ECHOCARDIOGRAM (TEE);  Surgeon: [name], MD;  Location: [facility] ENDOSCOPY;  Service: Cardiovascular;  Laterality: N/A;   TEE WITHOUT CARDIOVERSION N/A 03/25/2017   Procedure: TRANSESOPHAGEAL ECHOCARDIOGRAM (TEE);  Surgeon: [name], MD;  Location: [facility] ENDOSCOPY;  Service: Cardiovascular;  Laterality: N/A;   TOTAL KNEE ARTHROPLASTY Right 04/10/2020   Procedure: TOTAL KNEE ARTHROPLASTY;  Surgeon: Renette Butters, MD;  Location: [facility] ORS;  Service: Orthopedics;  Laterality: Right;   FH  Family History  Problem Relation Age of Onset   Asthma Mother    Diabetes Father    Heart disease Father        Died of presumed heart attack - 84s   Lung disease Sister    Heart disease Sister        Twin sister has heart issue, unclear what kind   Healthy Daughter    Thyroid disease Neg Hx    Colon polyps Neg Hx    Esophageal cancer Neg Hx    Pancreatic cancer Neg Hx    Stomach cancer Neg Hx   No family hx of ESRD  SH  reports that she quit smoking about 4 years ago. Her smoking use included cigarettes. She smoked an average of .5 packs per day. She has never used smokeless tobacco. She reports current alcohol use. She reports that she does  not use drugs. Allergies  Allergies  Allergen Reactions   Ace Inhibitors Palpitations   Home medications Prior to Admission medications   Medication Sig Start Date End Date Taking? Authorizing Provider  Acetaminophen (TYLENOL PO) Take 1 tablet by mouth every 6 (six) hours as needed (headache/pain).    [provider]  furosemide (LASIX) 80 MG tablet Take 1 tablet (80 mg total) by mouth daily. Take extra tab in PM as needed 08/12/21   Bensimhon, Shaune Pascal, MD  JARDIANCE 10 MG TABS tablet TAKE 1 TABLET BY MOUTH DAILY BEFORE BREAKFAST. Patient taking differently: Take 10 mg by mouth daily. 08/26/21   Bensimhon, Shaune Pascal, MD  leflunomide (ARAVA) 20 MG tablet Take 1 tablet (20 mg total) by mouth daily. 10/15/21   Ofilia Neas, PA-C  Potassium Chloride ER 20 MEQ TBCR TAKE 1 TABLET BY MOUTH EVERY DAY Patient taking differently: Take 20 mEq by mouth daily. 09/27/21   Bensimhon, Shaune Pascal, MD  predniSONE (DELTASONE) 5 MG tablet Take 1 tablet (5 mg total) by mouth daily with breakfast. Patient taking differently: Take 5 mg by mouth daily with breakfast. Continuous 10/15/21   Ofilia Neas, PA-C  rosuvastatin (CRESTOR) 10 MG tablet Take 1 tablet (10 mg total) by mouth daily. 05/23/21   Janith Lima, MD  sacubitril-valsartan (ENTRESTO) 24-26 MG Take 1 tablet by mouth 2 (two) times daily. 08/12/21   Bensimhon, Shaune Pascal, MD    Current Medications Scheduled Meds:  chlorhexidine  15 mL Mouth Rinse BID   mouth rinse  15 mL Mouth Rinse q12n4p   Continuous Infusions:  heparin 1,350 Units/hr (12/31/21 1325)    sodium bicarbonate (isotonic) infusion in sterile water 100 mL/hr at 12/31/21 1235   PRN Meds:.acetaminophen **OR** acetaminophen  CBC Recent Labs  Lab 12/30/21 2342 12/30/21 2348 12/31/21 0005 12/31/21 0827 12/31/21 1158  WBC 12.9*  --   --   --   --   NEUTROABS 11.5*  --   --   --   --   HGB 12.8   < > 14.6 13.3 13.6  HCT 41.9   < > 43.0 39.0 40.0  MCV 93.1  --   --   --   --    PLT 98*  --   --   --   --    < > = values in this interval not displayed.   Basic Metabolic Panel Recent Labs  Lab 12/30/21 2342 12/30/21 2348 12/31/21 0005 12/31/21 0827 12/31/21 1158  NA 136 135 135 135 138  K 4.5 4.3 4.4 4.6 4.3  CL 104  --  104  --   --   CO2 16*  --   --   --   --   GLUCOSE 97  --  92  --   --   BUN 128*  --  >130*  --   --   CREATININE 4.95*  --  4.90*  --   --   CALCIUM 8.6*  --   --   --   --     Physical Exam   Blood pressure (!) 98/58, pulse 74, temperature 97.6 F (36.4 C), temperature source Axillary, resp. rate 20, height 5\' 4"  (1.626 m), weight 98.9 kg, SpO2 98 %. Physical Exam Constitutional:      Appearance: She is ill-appearing. She is not toxic-appearing or diaphoretic.     Comments: Breathing on BiPAP, only able to answer questions by shaking and nodding her head  Cardiovascular:     Rate and Rhythm: Normal rate and regular rhythm.     Pulses: Normal pulses.     Heart sounds: Normal heart sounds. No murmur heard.   No friction rub. No gallop.  Pulmonary:     Comments: Saturating at 98% on BiPAP, basal crackles appreciated. Not utilizing accessory muscles.  Musculoskeletal:     Right lower leg: No edema.     Left lower leg: No edema.  Skin:    General: Skin is warm and dry.    Assessment 71 y/o female with a PMH chronic systolic CHF, A-fib not on Eliquis due to history of diverticular GI bleeding, PVCs, CKD stage IIIb, chronic systolic CHF due to nonischemic cardiomyopathy (EF 35 to 40%), rheumatoid arthritis, Raynaud's disease, CVA, COPD, hypertension, hyperlipidemia and nephrology consulted based on her worsening kidney function.   AKI on CKD Stage IIIb likely in the Setting of Pre-renal Etiology AGMA:  Patient with CKD stage IIIb presents with superimposed AKI of 4.9 from BL of 1.3-1.5. Her urine labs indicate hypovolemic status, she is not hypervolemic on examination. Would like to give gentle hydration at this juncture, but  given her pulmonary congestion will defer for now. She was additionally acidotic with elevated BUN, but placed on Bicarb drip so she is getting some hydration. Will currently hold her home Entresto, Lasix, and Jardiance, and observe overnight and trend her renal function.  - Trend RFP - Avoid nephrotoxic medications including NSAIDS - Hold home Lasix, Jardiance, and Entresto.  - Holding on additional fluid resuscitation given pulmonary congestion and need for BiPAP.  - Continue Bicarb drip  Acute on Chronic Combined CHF:  Holding Lasix in setting of  AKI on CKD stage III, Appreciate cardiology's further recommendations.   Elevated Troponins:  No further ischemic workup per cardiology. Continuing heparin.   PAF:  Not on anticoagulation 2/2 to recurrent diverticular bleed. Currently in sinus rhythm.  - Per cardiology and PCCM.  Patricia Mercury, MD IMTS, PGY-3 12/31/2021,5:07 PM   Seen and examined independently.  Agree with note and exam as documented above by Dr. Gilford Rile and as noted here.  Patient with chronic combined systolic and diastolic CHF, CAD, and CKD stage III presented with malaise, report of diarrhea, chest discomfort. Found with Cr 4.95 from her baseline of 1.3 - 1.7.  Her home lasix and entresto and jardiance were held and she was started on bicarb gtt. Nephrology consulted for assistance with management of AKI.  She has been on BIPAP for Co2 retention, acidosis.  She provides limited hx due to bipap and is mostly limited to  head shakes and nods.  General adult female in bed on BIPAP HEENT normocephalic atraumatic extraocular movements intact sclera anicteric Neck supple trachea midline Lungs BIPAP obscures lung sounds Heart S1S2 no rub Abdomen soft nontender obese habitus Extremities trace edema  Psych no anxiety or agitation  Neuro - patient is on bipap and her answers are limited to nods and headshakes at this time Gu - leg bag for chronic foley in place (RN is going  to change foley)  # AKI - appears pre-renal in setting of diuretics/lasix, entresto, and jardiance which are now all on hold - agree - gentle hydration with IV bicarb as tolerated   # Acute Hypercarbic resp failure  - on bipap  # Metabolic acidosis - setting of AKI - on bicarb gtt per pulm - agree   # Chronic combined systolic and diastolic CHF  - home lasix, entresto, and jardiance on hold   # CKD stage 3 - Cr baseline 1.3 - 1.7  # UTI  - per primary team   Claudia Desanctis, MD 12/31/2021  6:01 PM

## 2021-12-31 NOTE — Progress Notes (Signed)
Patient was admitted earlier this morning by Dr. Marlowe Sax. Please see her H&P for detailed information. I was contacted by respiratory face earlier this morning for an ABG that does not seem to have improved despite BiPAP for 8 hours.  Patient primary presented with shortness of breath in the setting of known systolic CHF, recent diarrhea On admission, she had soft blood pressure and creatinine elevated to 4.9 (baseline creatinine less than 2).  On exam, she had pedal edema and chest x-ray showed congestion. However Lasix could not be given admission because of soft blood pressure and elevated creatinine. Initial blood gas with pH 7.14 with PCO2 62 after which she was placed on BiPAP. Despite BiPAP for more than 8 hours, there is no residual improvement in blood gas. Patient will open eyes and mumble but not able to have a full conversation on BiPAP. Discussed with critical care and cardiology. I noticed at this time that patient has been transferred to ICU level, may need intubation. We will continue to follow. If patient ends up getting intubated or require pressures, PCCM will be the primary team.

## 2021-12-31 NOTE — Assessment & Plan Note (Addendum)
-  Platelet count dropped to 55 K, now normal, likely related to critical illness

## 2021-12-31 NOTE — Progress Notes (Signed)
ANTICOAGULATION CONSULT NOTE - Initial Consult  Pharmacy Consult for heparin Indication:  r/o ACS  Allergies  Allergen Reactions   Ace Inhibitors Palpitations    Patient Measurements: Height: 5\' 4"  (162.6 cm) Weight: 98.9 kg (218 lb 0.6 oz) IBW/kg (Calculated) : 54.7 Heparin Dosing Weight: 80kg  Vital Signs: Temp: 97.5 F (36.4 C) (02/06 2229) Temp Source: Oral (02/06 2229) BP: 92/70 (02/07 0200) Pulse Rate: 79 (02/07 0200)  Labs: Recent Labs    12/30/21 2342 12/30/21 2348 12/31/21 0005  HGB 12.8 14.3 14.6  HCT 41.9 42.0 43.0  PLT 98*  --   --   APTT 29  --   --   LABPROT 17.6*  --   --   INR 1.5*  --   --   CREATININE 4.95*  --  4.90*  TROPONINIHS 5,916*  --   --     Estimated Creatinine Clearance: 12.6 mL/min (A) (by C-G formula based on SCr of 4.9 mg/dL (H)).   Medical History: Past Medical History:  Diagnosis Date   Acute blood loss anemia 03/05/2018   Acute hypercapnic respiratory failure ([place]) 04/30/2017   Acute renal failure superimposed on chronic kidney disease ([place]) 10/20/2016   Arthritis    hands, knees   Atrial fibrillation ([place])    a. s/p multiple cardioversions; failed tikosyn/sotalol.   Bradycardia 11/20/2016   CAD in native artery, s/p cardiac cath with non obstructive CAD 10/24/2016   Cerebrovascular accident (CVA) due to embolism of left cerebellar artery (HCC)    CHF (congestive heart failure) ([place])    Chronic anticoagulation 03/15/2018   Chronic atrial fibrillation ([place]) 2/35/5732   Chronic systolic (congestive) heart failure ([place]) 03/05/2018   CKD (chronic kidney disease) stage 3, GFR 30-59 ml/min (HCC)    Coagulopathy ([place]) 03/05/2018   DIVERTICULITIS, HX OF 07/25/2007   Diverticulosis of colon with hemorrhage 07/22/2007   Qualifier: Diagnosis of  By: [name]     DIVERTICULOSIS, COLON 07/22/2007   Dysuria 07/21/2017   Edema, peripheral    a. chronic BLE edema, R>L. Prior trauma from dog attack and accident.   Essential  hypertension 07/22/2007   Qualifier: Diagnosis of  By: [name]     History of kidney stones    HLD (hyperlipidemia) 02/03/2008   Qualifier: Diagnosis of  By: [name], James W    HYPERLIPIDEMIA 02/03/2008   Hypersomnia    declines w/u   Hypertension    Hypotension (arterial) 04/30/2017   MENOPAUSAL DISORDER 01/09/2011   Morbid obesity ([place]) 07/22/2007   NICM (nonischemic cardiomyopathy) ([place]) 10/24/2016   PAF (paroxysmal atrial fibrillation) ([place])    Presence of indwelling urinary catheter    supra pubic cath   Raynaud's syndrome 07/22/2007   Stroke ([place]) 2017   Suspected sleep apnea 05/20/2017   THYROID NODULE, RIGHT 01/04/2010   VITAMIN D DEFICIENCY 01/09/2011   Qualifier: Diagnosis of  By: [name], [name]     Assessment: 71yo female presents w/ slurred speech and RUE weakness >> concern for CVA, found to be hypoxic and on chart review found that this was likely a prolonged issue which could have contributed to stroke-like sx without acute CVA, CT head shows just old infarct; during w/u found to have elevated troponin >> to begin heparin.  Goal of Therapy:  Heparin level 0.3-0.7 units/ml Monitor platelets by anticoagulation protocol: Yes   Plan:  Will give heparin 2000 units IV bolus followed by infusion at 1100 units/hr and monitor heparin levels and CBC.  Charleston Ropes  Pamalee Leyden, PharmD, BCPS  12/31/2021,2:43 AM

## 2021-12-31 NOTE — Assessment & Plan Note (Addendum)
Status post ablation in 2020 and amiodarone was stopped.    Her heart reate remained well controlled, she has been placed on apixaban for anticoagulation with good toleration.

## 2021-12-31 NOTE — Progress Notes (Signed)
Placed on BIPAP for HS use.

## 2021-12-31 NOTE — Progress Notes (Signed)
ANTICOAGULATION CONSULT NOTE - Follow Up Consult  Pharmacy Consult for IV Heparin Indication: chest pain/ACS  Allergies  Allergen Reactions   Ace Inhibitors Palpitations    Patient Measurements: Height: 5\' 4"  (162.6 cm) Weight: 98.9 kg (218 lb 0.6 oz) IBW/kg (Calculated) : 54.7 Heparin Dosing Weight: 80 kg  Vital Signs: Temp: 98.4 F (36.9 C) (02/07 1100) Temp Source: Axillary (02/07 1100) BP: 136/113 (02/07 1036) Pulse Rate: 75 (02/07 1000)  Labs: Recent Labs    12/30/21 2342 12/30/21 2348 12/31/21 0005 12/31/21 0215 12/31/21 0827 12/31/21 1052 12/31/21 1158  HGB 12.8   < > 14.6  --  13.3  --  13.6  HCT 41.9   < > 43.0  --  39.0  --  40.0  PLT 98*  --   --   --   --   --   --   APTT 29  --   --   --   --   --   --   LABPROT 17.6*  --   --   --   --   --   --   INR 1.5*  --   --   --   --   --   --   HEPARINUNFRC  --   --   --   --   --  <0.10*  --   CREATININE 4.95*  --  4.90*  --   --   --   --   TROPONINIHS 5,916*  --   --  5,616*  --   --   --    < > = values in this interval not displayed.    Estimated Creatinine Clearance: 12.6 mL/min (A) (by C-G formula based on SCr of 4.9 mg/dL (H)).   Medications:  Infusions:   heparin 1,100 Units/hr (12/31/21 1100)    sodium bicarbonate (isotonic) infusion in sterile water      Assessment: 71 year of age female with history of CVA who is on IV Heparin therapy for ACS. Patient presented with concern of CVA. CT Head was negative for acute change (old infarct noted). No MRI available.   Initial heparin level < 0.10. CBC is stable. AKI noted- SCr 4.90 (baseline ~1.7).   Goal of Therapy:  Heparin level 0.3 to 0.5 units/ml Monitor platelets by anticoagulation protocol: Yes   Plan:  Increase Heparin to 1350 units/hr.  Recheck Heparin level in 8 hours.  Daily Heparin level and CBC while on therapy.   Sloan Leiter, PharmD, BCPS, BCCCP Clinical Pharmacist Please refer to [facility] for [place]  numbers 12/31/2021,12:22 PM

## 2021-12-31 NOTE — Progress Notes (Signed)
Pt given break off bipap at this time. Oral care provided by RN and pt placed on 6L nasal cannula at this time by RT. Pt endorses dry mouth/sore throat. No further needs at this time. Pt to be placed back on bipap QHS. RT will continue to monitor and be available as needed.

## 2021-12-31 NOTE — Progress Notes (Signed)
Echocardiogram 2D Echocardiogram has been performed.  Patricia Wagner 12/31/2021, 1:50 PM

## 2021-12-31 NOTE — Assessment & Plan Note (Deleted)
BUN 123, creatinine 3.99(baseline 1.3-1.5).  Patient is on Lasix 80 mg daily. Renal ultrasound is normal. Nephrology is consulted. I appreciate their assistance.

## 2021-12-31 NOTE — Assessment & Plan Note (Addendum)
Mild to moderate mitral regurgitation Acute hypoxic hypercapnic respiratory failure Patient had a prolonged hospitalization, she was admitted to the intensive care unit. Patient was placed on furosemide for diuresis and able to be liberated from non invasive mechanical ventilation.   Further work up with echocardiogram 12/31/2021 has demonstrated EF 45-50%, severe RV enlargement with dysfunction   Patient required midodrine for blood pressure support and eventually transitioned to oral torsemide for diuresis. She will need close follow up as outpatient.  Not able to start on guideline directed therapy for heart failure due to hypotension and risk of worsening rena function.

## 2021-12-31 NOTE — Consult Note (Signed)
NAME:  Patricia Wagner, MRN:  [id number], DOB:  01-29-1951, LOS: 0 ADMISSION DATE:  12/30/2021, CONSULTATION DATE: 12/31/2021 REFERRING MD: [facility], CHIEF COMPLAINT: Refractory respiratory failure.  History of Present Illness:  71 year old female who presents with increasing respiratory distress and has a well-documented past medical history of atrial fibrillation, chronic kidney disease, congestive heart failure, rheumatoid arthritis, Raynaud's disease, cardiovascular accident, COPD, urinary retention hypertension hyperlipidemia with known congestive heart failure.  She was trialed on noninvasive mechanical ventilatory support with no improvement of her pH of 7.1 therefore pulmonary critical care was called to the bedside.  She was started on a bicarbonate drip she will be transferred to the intensive care unit and has a low threshold for being intubated.  She may need a central line and/or hemodialysis catheter in the near future.  Pertinent  Medical History   Past Medical History:  Diagnosis Date   Acute blood loss anemia 03/05/2018   Acute hypercapnic respiratory failure ([place]) 04/30/2017   Acute renal failure superimposed on chronic kidney disease ([place]) 10/20/2016   Arthritis    hands, knees   Atrial fibrillation ([place])    a. s/p multiple cardioversions; failed tikosyn/sotalol.   Bradycardia 11/20/2016   CAD in native artery, s/p cardiac cath with non obstructive CAD 10/24/2016   Cerebrovascular accident (CVA) due to embolism of left cerebellar artery (HCC)    CHF (congestive heart failure) ([place])    Chronic anticoagulation 03/15/2018   Chronic atrial fibrillation ([place]) 5/36/4680   Chronic systolic (congestive) heart failure ([place]) 03/05/2018   CKD (chronic kidney disease) stage 3, GFR 30-59 ml/min (HCC)    Coagulopathy ([place]) 03/05/2018   DIVERTICULITIS, HX OF 07/25/2007   Diverticulosis of colon with hemorrhage 07/22/2007   Qualifier: Diagnosis of  By: [name]     DIVERTICULOSIS, COLON  07/22/2007   Dysuria 07/21/2017   Edema, peripheral    a. chronic BLE edema, R>L. Prior trauma from dog attack and accident.   Essential hypertension 07/22/2007   Qualifier: Diagnosis of  By: [name]     History of kidney stones    HLD (hyperlipidemia) 02/03/2008   Qualifier: Diagnosis of  By: [name], James W    HYPERLIPIDEMIA 02/03/2008   Hypersomnia    declines w/u   Hypertension    Hypotension (arterial) 04/30/2017   MENOPAUSAL DISORDER 01/09/2011   Morbid obesity ([place]) 07/22/2007   NICM (nonischemic cardiomyopathy) ([place]) 10/24/2016   PAF (paroxysmal atrial fibrillation) ([place])    Presence of indwelling urinary catheter    supra pubic cath   Raynaud's syndrome 07/22/2007   Stroke ([place]) 2017   Suspected sleep apnea 05/20/2017   THYROID NODULE, RIGHT 01/04/2010   VITAMIN D DEFICIENCY 01/09/2011   Qualifier: Diagnosis of  By: [name], [place] [facility] Events: Including procedures, antibiotic start and stop dates in addition to other pertinent events     Interim History / Subjective:  Does not respond to noninvasive mechanical ventilatory support, acute renal failure transfer to intensive.  Objective   Blood pressure (!) 96/45, pulse 75, temperature (!) 97.5 F (36.4 C), temperature source Oral, resp. rate 14, height 5\' 4"  (1.626 m), weight 98.9 kg, SpO2 92 %.    FiO2 (%):  [40 %] 40 %  No intake or output data in the 24 hours ending 12/31/21 0905 Filed Weights   12/30/21 2229  Weight: 98.9 kg    Examination: General: Obese female poorly responsive HENT: Fullface mask in place no JVD  appreciated Lungs: Poor air movement bilaterally Cardiovascular: Heart sounds are irregular Abdomen: Soft positive bowel Extremities: 2+ edema Neuro: Responsive GU: Catheter in place  Resolved Hospital Problem list     Assessment & Plan:  Acute on chronic hypercarbic hypoxic respiratory failure in the setting of acute renal insufficiency and failure of noninvasive  mechanical ventilatory support to correct underlying acidosis. Admit to the intensive care Continue noninvasive mechanical ventilation Sodium bicarb drip May need intubation in the near future  Congestive heart failure, chronic atrial fibrillation. Cardiology, consult Systemic Heparin Admit to the intensive care unit  Renal failure Lab Results  Component Value Date   CREATININE 4.90 (H) 12/31/2021   CREATININE 4.95 (H) 12/30/2021   CREATININE 1.78 (H) 10/15/2021   CREATININE 1.31 (H) 07/11/2021   CREATININE 1.45 (H) 05/22/2021   CREATININE 1.37 (H) 02/01/2021   Avoid nephrotoxic Bicarbonate drip Consider nephrology consult If central line placed consider placing triple-lumen hemodialysis catheter      Best Practice (right click and "Reselect all SmartList Selections" daily)   Diet/type: NPO DVT prophylaxis: systemic heparin GI prophylaxis: PPI Lines: N/A Foley:  N/A Code Status:  full code Last date of multidisciplinary goals of care discussion [TBD]  Labs   CBC: Recent Labs  Lab 12/30/21 2342 12/30/21 2348 12/31/21 0005 12/31/21 0827  WBC 12.9*  --   --   --   NEUTROABS 11.5*  --   --   --   HGB 12.8 14.3 14.6 13.3  HCT 41.9 42.0 43.0 39.0  MCV 93.1  --   --   --   PLT 98*  --   --   --     Basic Metabolic Panel: Recent Labs  Lab 12/30/21 2342 12/30/21 2348 12/31/21 0005 12/31/21 0827  NA 136 135 135 135  K 4.5 4.3 4.4 4.6  CL 104  --  104  --   CO2 16*  --   --   --   GLUCOSE 97  --  92  --   BUN 128*  --  >130*  --   CREATININE 4.95*  --  4.90*  --   CALCIUM 8.6*  --   --   --    GFR: Estimated Creatinine Clearance: 12.6 mL/min (A) (by C-G formula based on SCr of 4.9 mg/dL (H)). Recent Labs  Lab 12/30/21 2342  WBC 12.9*  LATICACIDVEN 1.4    Liver Function Tests: Recent Labs  Lab 12/30/21 2342  AST 41  ALT 28  ALKPHOS 151*  BILITOT 1.3*  PROT 6.0*  ALBUMIN 3.0*   No results for input(s): LIPASE, AMYLASE in the last 168  hours. No results for input(s): AMMONIA in the last 168 hours.  ABG    Component Value Date/Time   PHART 7.170 (LL) 12/31/2021 0827   PCO2ART 60.2 (H) 12/31/2021 0827   PO2ART 113 (H) 12/31/2021 0827   HCO3 22.0 12/31/2021 0827   TCO2 24 12/31/2021 0827   ACIDBASEDEF 7.0 (H) 12/31/2021 0827   O2SAT 97.0 12/31/2021 0827     Coagulation Profile: Recent Labs  Lab 12/30/21 2342  INR 1.5*    Cardiac Enzymes: No results for input(s): CKTOTAL, CKMB, CKMBINDEX, TROPONINI in the last 168 hours.  HbA1C: Hemoglobin A1C  Date/Time Value Ref Range Status  02/06/2020 04:03 PM 5.6 4.0 - 5.6 % Final   Hgb A1c MFr Bld  Date/Time Value Ref Range Status  03/05/2020 05:40 PM 4.7 (L) 4.8 - 5.6 % Final    Comment:  Prediabetes: 5.7 - 6.4          Diabetes: >6.4          Glycemic control for adults with diabetes: <7.0   08/06/2018 06:14 AM 6.4 (H) 4.8 - 5.6 % Final    Comment:    (NOTE) Pre diabetes:          5.7%-6.4% Diabetes:              >6.4% Glycemic control for   <7.0% adults with diabetes     CBG: Recent Labs  Lab 12/30/21 2345  GLUCAP 96    Review of Systems:   na  Past Medical History:  She,  has a past medical history of Acute blood loss anemia (03/05/2018), Acute hypercapnic respiratory failure ([place]) (04/30/2017), Acute renal failure superimposed on chronic kidney disease ([place]) (10/20/2016), Arthritis, Atrial fibrillation ([place]), Bradycardia (11/20/2016), CAD in native artery, s/p cardiac cath with non obstructive CAD (10/24/2016), Cerebrovascular accident (CVA) due to embolism of left cerebellar artery ([place]), CHF (congestive heart failure) ([place]), Chronic anticoagulation (03/15/2018), Chronic atrial fibrillation ([place]) (4/78/2956), Chronic systolic (congestive) heart failure ([place]) (03/05/2018), CKD (chronic kidney disease) stage 3, GFR 30-59 ml/min ([place]), Coagulopathy ([place]) (03/05/2018), DIVERTICULITIS, HX OF (07/25/2007), Diverticulosis of colon with hemorrhage  (07/22/2007), DIVERTICULOSIS, COLON (07/22/2007), Dysuria (07/21/2017), Edema, peripheral, Essential hypertension (07/22/2007), History of kidney stones, HLD (hyperlipidemia) (02/03/2008), HYPERLIPIDEMIA (02/03/2008), Hypersomnia, Hypertension, Hypotension (arterial) (04/30/2017), MENOPAUSAL DISORDER (01/09/2011), Morbid obesity ([place]) (07/22/2007), NICM (nonischemic cardiomyopathy) ([place]) (10/24/2016), PAF (paroxysmal atrial fibrillation) ([place]), Presence of indwelling urinary catheter, Raynaud's syndrome (07/22/2007), Stroke ([place]) (2017), Suspected sleep apnea (05/20/2017), THYROID NODULE, RIGHT (01/04/2010), and VITAMIN D DEFICIENCY (01/09/2011).   Surgical History:   Past Surgical History:  Procedure Laterality Date   ATRIAL FIBRILLATION ABLATION N/A 10/13/2019   Procedure: ATRIAL FIBRILLATION ABLATION;  Surgeon: [name], MD;  Location: [place] [facility];  Service: Cardiovascular;  Laterality: N/A;   CARDIAC CATHETERIZATION N/A 10/23/2016   Procedure: Left Heart Cath and Coronary Angiography;  Surgeon: [name], MD;  Location: [place] [facility];  Service: Cardiovascular;  Laterality: N/A;   CARDIOVERSION N/A 10/31/2016   Procedure: CARDIOVERSION;  Surgeon: [name], MD;  Location: [place];  Service: Cardiovascular;  Laterality: N/A;   CARDIOVERSION N/A 11/03/2016   Procedure: CARDIOVERSION;  Surgeon: [name], MD;  Location: [place];  Service: Cardiovascular;  Laterality: N/A;   CARDIOVERSION N/A 11/18/2016   Procedure: CARDIOVERSION;  Surgeon: [name], MD;  Location: [facility] ENDOSCOPY;  Service: Cardiovascular;  Laterality: N/A;   CARDIOVERSION N/A 03/25/2017   Procedure: CARDIOVERSION;  Surgeon: [name], MD;  Location: [facility] ENDOSCOPY;  Service: Cardiovascular;  Laterality: N/A;   CARDIOVERSION N/A 05/06/2017   Procedure: CARDIOVERSION;  Surgeon: [name], MD;  Location: [facility] ENDOSCOPY;  Service: Cardiovascular;  Laterality: N/A;   CARDIOVERSION  N/A 05/12/2017   Procedure: CARDIOVERSION;  Surgeon: [name], MD;  Location: [facility] ENDOSCOPY;  Service: Cardiovascular;  Laterality: N/A;   CHOLECYSTECTOMY     COLONOSCOPY N/A 12/04/2017   Procedure: COLONOSCOPY;  Surgeon: [name], MD;  Location: [facility] ENDOSCOPY;  Service: Endoscopy;  Laterality: N/A;   COLONOSCOPY     COLONOSCOPY W/ POLYPECTOMY  02/2011   pan diverticulosis.  tubular adenoma without dysplasia on 5 mm sigmoid polyp.  Dr Fuller Plan.     ESOPHAGOGASTRODUODENOSCOPY (EGD) WITH PROPOFOL N/A 05/16/2020   Procedure: ESOPHAGOGASTRODUODENOSCOPY (EGD) WITH PROPOFOL;  Surgeon: Milus Banister, MD;  Location: [facility] ENDOSCOPY;  Service: Endoscopy;  Laterality:  N/A;   FLEXIBLE SIGMOIDOSCOPY N/A 01/18/2020   Procedure: FLEXIBLE SIGMOIDOSCOPY;  Surgeon: [name], DO;  Location: [place];  Service: Gastroenterology;  Laterality: N/A;   HEMOSTASIS CLIP PLACEMENT  01/18/2020   Procedure: HEMOSTASIS CLIP PLACEMENT;  Surgeon: [name], DO;  Location: [place];  Service: Gastroenterology;;   HOT HEMOSTASIS N/A 05/16/2020   Procedure: HOT HEMOSTASIS (ARGON PLASMA COAGULATION/BICAP);  Surgeon: Milus Banister, MD;  Location: [facility] ENDOSCOPY;  Service: Endoscopy;  Laterality: N/A;   PARTIAL HYSTERECTOMY     1 OVARY LEFT   SCLEROTHERAPY  01/18/2020   Procedure: SCLEROTHERAPY;  Surgeon: [name], DO;  Location: [place] ENDOSCOPY;  Service: Gastroenterology;;   TEE WITHOUT CARDIOVERSION N/A 10/31/2016   Procedure: TRANSESOPHAGEAL ECHOCARDIOGRAM (TEE);  Surgeon: [name], MD;  Location: [facility] ENDOSCOPY;  Service: Cardiovascular;  Laterality: N/A;   TEE WITHOUT CARDIOVERSION N/A 03/25/2017   Procedure: TRANSESOPHAGEAL ECHOCARDIOGRAM (TEE);  Surgeon: [name], MD;  Location[facility] ENDOSCOPY;  Service: Cardiovascular;  Laterality: N/A;   TOTAL KNEE ARTHROPLASTY Right 04/10/2020   Procedure: TOTAL KNEE ARTHROPLASTY;  Surgeon: Renette Butters, MD;  Location: [facility]  ORS;  Service: Orthopedics;  Laterality: Right;     Social History:   reports that she quit smoking about 4 years ago. Her smoking use included cigarettes. She smoked an average of .5 packs per day. She has never used smokeless tobacco. She reports current alcohol use. She reports that she does not use drugs.   Family History:  Her family history includes Asthma in her mother; Diabetes in her father; Healthy in her daughter; Heart disease in her father and sister; Lung disease in her sister. There is no history of Thyroid disease, Colon polyps, Esophageal cancer, Pancreatic cancer, or Stomach cancer.   Allergies Allergies  Allergen Reactions   Ace Inhibitors Palpitations     Home Medications  Prior to Admission medications   Medication Sig Start Date End Date Taking? Authorizing Provider  Acetaminophen (TYLENOL PO) Take 1 tablet by mouth every 6 (six) hours as needed (headache/pain).    [provider]  furosemide (LASIX) 80 MG tablet Take 1 tablet (80 mg total) by mouth daily. Take extra tab in PM as needed 08/12/21   Bensimhon, Shaune Pascal, MD  JARDIANCE 10 MG TABS tablet TAKE 1 TABLET BY MOUTH DAILY BEFORE BREAKFAST. Patient taking differently: Take 10 mg by mouth daily. 08/26/21   Bensimhon, Shaune Pascal, MD  leflunomide (ARAVA) 20 MG tablet Take 1 tablet (20 mg total) by mouth daily. 10/15/21   Ofilia Neas, PA-C  Potassium Chloride ER 20 MEQ TBCR TAKE 1 TABLET BY MOUTH EVERY DAY Patient taking differently: Take 20 mEq by mouth daily. 09/27/21   Bensimhon, Shaune Pascal, MD  predniSONE (DELTASONE) 5 MG tablet Take 1 tablet (5 mg total) by mouth daily with breakfast. Patient taking differently: Take 5 mg by mouth daily with breakfast. Continuous 10/15/21   Ofilia Neas, PA-C  rosuvastatin (CRESTOR) 10 MG tablet Take 1 tablet (10 mg total) by mouth daily. 05/23/21   Janith Lima, MD  sacubitril-valsartan (ENTRESTO) 24-26 MG Take 1 tablet by mouth 2 (two) times daily. 08/12/21    Bensimhon, Shaune Pascal, MD     Critical care time: 77 min    Richardson Landry Areeb Corron ACNP Acute Care Nurse Practitioner [place] Please consult Amion 12/31/2021, 9:37 AM

## 2021-12-31 NOTE — Consult Note (Deleted)
Cardiology Consultation:   Patient ID: [name] MRN: [id number]; DOB: 1951-04-02  Admit date: 12/30/2021 Date of Consult: 12/31/2021  PCP:  Janith Lima, MD   [facility] Providers Cardiologist:  Glori Bickers, MD  Electrophysiologist:  [name], MD       Patient Profile:   Patricia Wagner is a 71 y.o. female with a hx of of A-fib s/p ablation 2020, not on Eliquis due to history of recurrent diverticular GI bleeding, PVCs, CKD stage IIIb w/ Cr +/- 1.5, chronic systolic CHF due to nonischemic cardiomyopathy (EF 80 to 40%), rheumatoid arthritis, Raynaud's disease, CVA, COPD, hypertension, hyperlipidemia, urinary retention, who is being seen 12/31/2021 for the evaluation of CHF at the request of Dr Pietro Cassis.  History of Present Illness:   Patricia Wagner had an EF of 15% with severe biventricular dysfunction in 2018, this was felt tachycardia mediated due to A-fib.  [name] last saw her 08/12/2021, respiratory status was at baseline with a weight of 209 pounds, EF 30%, amyloid work-up negative.  Continue Lasix 80 mg daily, take twice daily as needed for edema When seen by Dr. Curt Bears 09/2021, she was maintaining sinus rhythm.  She has recently been struggling with diarrhea, saw her PCP 5 days ago and had a near syncopal event that day.  Refused transport.  Came to the ER 02/06 with slurred speech and right arm weakness, O2 sats in the 70s, placed on BiPAP.  BNP greater than 4500, significantly elevated troponin greater than 5000, cards asked to see.  Patricia Wagner has been having diarrhea for over a week.  Just before this admission, the symptoms and started to ease.  There was no nausea or vomiting with it.  She was able to drink liquids but her p.o. intake was decreased because of the diarrhea.  She is breathing a little bit better now.  She is thirsty.  She does not know how much weight she has gained, but thinks it has gone up.  The respiratory symptoms started fairly  suddenly, within the last couple of days.  She does not like being on the BiPAP, but feels a little better with it.  No chest pain.   Past Medical History:  Diagnosis Date   Acute blood loss anemia 03/05/2018   Acute hypercapnic respiratory failure ([place]) 04/30/2017   Acute renal failure superimposed on chronic kidney disease ([place]) 10/20/2016   Arthritis    hands, knees   Atrial fibrillation ([place])    a. s/p multiple cardioversions; failed tikosyn/sotalol.   Bradycardia 11/20/2016   CAD in native artery, s/p cardiac cath with non obstructive CAD 10/24/2016   Cerebrovascular accident (CVA) due to embolism of left cerebellar artery (HCC)    CHF (congestive heart failure) ([place])    Chronic anticoagulation 03/15/2018   Chronic atrial fibrillation ([place]) 03/02/7352   Chronic systolic (congestive) heart failure ([place]) 03/05/2018   CKD (chronic kidney disease) stage 3, GFR 30-59 ml/min (HCC)    Coagulopathy ([place]) 03/05/2018   DIVERTICULITIS, HX OF 07/25/2007   Diverticulosis of colon with hemorrhage 07/22/2007   Qualifier: Diagnosis of  By: [name]     DIVERTICULOSIS, COLON 07/22/2007   Dysuria 07/21/2017   Edema, peripheral    a. chronic BLE edema, R>L. Prior trauma from dog attack and accident.   Essential hypertension 07/22/2007   Qualifier: Diagnosis of  By: [name]     History of kidney stones    HLD (hyperlipidemia) 02/03/2008   Qualifier: Diagnosis of  By:  John MD, [name]    HYPERLIPIDEMIA 02/03/2008   Hypersomnia    declines w/u   Hypertension    Hypotension (arterial) 04/30/2017   MENOPAUSAL DISORDER 01/09/2011   Morbid obesity ([place]) 07/22/2007   NICM (nonischemic cardiomyopathy) ([place]) 10/24/2016   PAF (paroxysmal atrial fibrillation) ([place])    Presence of indwelling urinary catheter    supra pubic cath   Raynaud's syndrome 07/22/2007   Stroke ([place]) 2017   Suspected sleep apnea 05/20/2017   THYROID NODULE, RIGHT 01/04/2010   VITAMIN D DEFICIENCY 01/09/2011   Qualifier:  Diagnosis of  By: [name], [name]     Past Surgical History:  Procedure Laterality Date   ATRIAL FIBRILLATION ABLATION N/A 10/13/2019   Procedure: ATRIAL FIBRILLATION ABLATION;  Surgeon: [name], MD;  Location: [place] [facility];  Service: Cardiovascular;  Laterality: N/A;   CARDIAC CATHETERIZATION N/A 10/23/2016   Procedure: Left Heart Cath and Coronary Angiography;  Surgeon: [name], MD;  Location: [place] [facility];  Service: Cardiovascular;  Laterality: N/A;   CARDIOVERSION N/A 10/31/2016   Procedure: CARDIOVERSION;  Surgeon: [name], MD;  Location: [place];  Service: Cardiovascular;  Laterality: N/A;   CARDIOVERSION N/A 11/03/2016   Procedure: CARDIOVERSION;  Surgeon: [name], MD;  Location: [place];  Service: Cardiovascular;  Laterality: N/A;   CARDIOVERSION N/A 11/18/2016   Procedure: CARDIOVERSION;  Surgeon: [name], MD;  Location: [facility] ENDOSCOPY;  Service: Cardiovascular;  Laterality: N/A;   CARDIOVERSION N/A 03/25/2017   Procedure: CARDIOVERSION;  Surgeon: [name], MD;  Location: [facility] ENDOSCOPY;  Service: Cardiovascular;  Laterality: N/A;   CARDIOVERSION N/A 05/06/2017   Procedure: CARDIOVERSION;  Surgeon: [name], MD;  Location: [facility] ENDOSCOPY;  Service: Cardiovascular;  Laterality: N/A;   CARDIOVERSION N/A 05/12/2017   Procedure: CARDIOVERSION;  Surgeon: [name], MD;  Location: [facility] ENDOSCOPY;  Service: Cardiovascular;  Laterality: N/A;   CHOLECYSTECTOMY     COLONOSCOPY N/A 12/04/2017   Procedure: COLONOSCOPY;  Surgeon: [name], MD;  Location: [facility] ENDOSCOPY;  Service: Endoscopy;  Laterality: N/A;   COLONOSCOPY     COLONOSCOPY W/ POLYPECTOMY  02/2011   pan diverticulosis.  tubular adenoma without dysplasia on 5 mm sigmoid polyp.  Dr Fuller Plan.     ESOPHAGOGASTRODUODENOSCOPY (EGD) WITH PROPOFOL N/A 05/16/2020   Procedure: ESOPHAGOGASTRODUODENOSCOPY (EGD) WITH PROPOFOL;  Surgeon: Milus Banister,  MD;  Location: [facility] ENDOSCOPY;  Service: Endoscopy;  Laterality: N/A;   FLEXIBLE SIGMOIDOSCOPY N/A 01/18/2020   Procedure: FLEXIBLE SIGMOIDOSCOPY;  Surgeon: [name], DO;  Location: [place];  Service: Gastroenterology;  Laterality: N/A;   HEMOSTASIS CLIP PLACEMENT  01/18/2020   Procedure: HEMOSTASIS CLIP PLACEMENT;  Surgeon: [name], DO;  Location: [place];  Service: Gastroenterology;;   HOT HEMOSTASIS N/A 05/16/2020   Procedure: HOT HEMOSTASIS (ARGON PLASMA COAGULATION/BICAP);  Surgeon: Milus Banister, MD;  Location: [facility];  Service: Endoscopy;  Laterality: N/A;   PARTIAL HYSTERECTOMY     1 OVARY LEFT   SCLEROTHERAPY  01/18/2020   Procedure: SCLEROTHERAPY;  Surgeon: [name], DO;  Location: [place] ENDOSCOPY;  Service: Gastroenterology;;   TEE WITHOUT CARDIOVERSION N/A 10/31/2016   Procedure: TRANSESOPHAGEAL ECHOCARDIOGRAM (TEE);  Surgeon: [name], MD;  Location: [facility] ENDOSCOPY;  Service: Cardiovascular;  Laterality: N/A;   TEE WITHOUT CARDIOVERSION N/A 03/25/2017   Procedure: TRANSESOPHAGEAL ECHOCARDIOGRAM (TEE);  Surgeon: [name], MD;  Location: [facility] ENDOSCOPY;  Service: Cardiovascular;  Laterality: N/A;   TOTAL KNEE  ARTHROPLASTY Right 04/10/2020   Procedure: TOTAL KNEE ARTHROPLASTY;  Surgeon: Renette Butters, MD;  Location: [facility]S;  Service: Orthopedics;  Laterality: Right;     Home Medications:  Prior to Admission medications   Medication Sig Start Date End Date Taking? Authorizing Provider  Acetaminophen (TYLENOL PO) Take 1 tablet by mouth every 6 (six) hours as needed (headache/pain).    [provider]  furosemide (LASIX) 80 MG tablet Take 1 tablet (80 mg total) by mouth daily. Take extra tab in PM as needed 08/12/21   Bensimhon, Shaune Pascal, MD  JARDIANCE 10 MG TABS tablet TAKE 1 TABLET BY MOUTH DAILY BEFORE BREAKFAST. Patient taking differently: Take 10 mg by mouth daily. 08/26/21   Bensimhon, Shaune Pascal, MD  leflunomide  (ARAVA) 20 MG tablet Take 1 tablet (20 mg total) by mouth daily. 10/15/21   Ofilia Neas, PA-C  Potassium Chloride ER 20 MEQ TBCR TAKE 1 TABLET BY MOUTH EVERY DAY Patient taking differently: Take 20 mEq by mouth daily. 09/27/21   Bensimhon, Shaune Pascal, MD  predniSONE (DELTASONE) 5 MG tablet Take 1 tablet (5 mg total) by mouth daily with breakfast. Patient taking differently: Take 5 mg by mouth daily with breakfast. Continuous 10/15/21   Ofilia Neas, PA-C  rosuvastatin (CRESTOR) 10 MG tablet Take 1 tablet (10 mg total) by mouth daily. 05/23/21   Janith Lima, MD  sacubitril-valsartan (ENTRESTO) 24-26 MG Take 1 tablet by mouth 2 (two) times daily. 08/12/21   Bensimhon, Shaune Pascal, MD    Inpatient Medications: Scheduled Meds:  sodium bicarbonate  100 mEq Intravenous Once   Continuous Infusions:  heparin 1,100 Units/hr (12/31/21 0346)    sodium bicarbonate (isotonic) infusion in sterile water     PRN Meds: acetaminophen **OR** acetaminophen  Allergies:    Allergies  Allergen Reactions   Ace Inhibitors Palpitations    Social History:   Social History   Socioeconomic History   Marital status: Single    Spouse name: Not on file   Number of children: 1   Years of education: Not on file   Highest education level: Not on file  Occupational History   Occupation: Academic librarian   Tobacco Use   Smoking status: Former    Packs/day: 0.50    Types: Cigarettes    Quit date: 2019    Years since quitting: 4.1   Smokeless tobacco: Never  Vaping Use   Vaping Use: Never used  Substance and Sexual Activity   Alcohol use: Yes    Comment: occ   Drug use: No   Sexual activity: Not on file  Other Topics Concern   Not on file  Social History Narrative   Not on file   Social Determinants of Health   Financial Resource Strain: Not on file  Food Insecurity: No Food Insecurity   Worried About Running Out of Food in the Last Year: Never true   [place] in the Last Year: Never true   Transportation Needs: No Transportation Needs   Lack of Transportation (Medical): No   Lack of Transportation (Non-Medical): No  Physical Activity: Not on file  Stress: Not on file  Social Connections: Not on file  Intimate Partner Violence: Not on file    Family History:   Family History  Problem Relation Age of Onset   Asthma Mother    Diabetes Father    Heart disease Father        Died of presumed heart attack - 8s  Lung disease Sister    Heart disease Sister        Twin sister has heart issue, unclear what kind   Healthy Daughter    Thyroid disease Neg Hx    Colon polyps Neg Hx    Esophageal cancer Neg Hx    Pancreatic cancer Neg Hx    Stomach cancer Neg Hx      ROS:  Please see the history of present illness.  All other ROS reviewed and negative.     Physical Exam/Data:   Vitals:   12/31/21 0600 12/31/21 0700 12/31/21 0730 12/31/21 0800  BP: (!) 95/58 99/62 102/71 (!) 96/45  Pulse: 79 70 76 75  Resp: 18 18 20 14   Temp:      TempSrc:      SpO2: 97% 96% 94% 92%  Weight:      Height:       No intake or output data in the 24 hours ending 12/31/21 0918 Last 3 Weights 12/30/2021 10/15/2021 09/30/2021  Weight (lbs) 218 lb 0.6 oz 218 lb 215 lb 6.4 oz  Weight (kg) 98.9 kg 98.884 kg 97.705 kg     Body mass index is 37.43 kg/m.  General:  Well nourished, well developed, in respiratory distress HEENT: normal Neck:  JVD appears elevated, but difficult to assess secondary to equipment Vascular: No carotid bruits; Distal pulses 2+ bilaterally Cardiac:  normal S1, S2; RRR; no murmur  Lungs: Difficult to hear secondary to the noise of the BiPAP, but has some rails  Abd: soft, nontender, no hepatomegaly  Ext: Trace edema Musculoskeletal:  No deformities, BUE and BLE strength normal and equal Skin: warm and dry  Neuro:  CNs 2-12 intact, no focal abnormalities noted Psych:  Normal affect   EKG:  The EKG was personally reviewed and demonstrates: Sinus rhythm, heart  rate 87 Telemetry:  Telemetry was personally reviewed and demonstrates: Sinus rhythm with PVCs in pairs  Relevant CV Studies:  ECHO: Ordered  ECHO: 08/12/2021  1. Left ventricular ejection fraction, by estimation, is 35 to 40%. The  left ventricle has moderately decreased function. The left ventricle  demonstrates global hypokinesis. The left ventricular internal cavity size was mildly dilated. Left ventricular diastolic parameters are consistent with Grade I diastolic dysfunction (impaired relaxation).   2. Right ventricular systolic function is normal. The right ventricular  size is normal.   3. Left atrial size was severely dilated.   4. Right atrial size was severely dilated.   5. The mitral valve is normal in structure. Mild to moderate mitral valve regurgitation.   6. The aortic valve is normal in structure. Aortic valve regurgitation is not visualized. No aortic stenosis is present.   CARDIAC CATH: 10/23/2016 Conclusions: Mild, non-obstructive coronary artery disease consistent with non-ischemic cardiomyopathy. Mildly elevated left ventricular filling pressure.   Recommendations: Continue medical treatment of non-ischemic cardiomyopathy and atrial fibrillation with rapid ventricular response. Primary prevention to prevent progression of mild CAD. Restart apixaban at 2200 tonight if right radial arteriotomy site is benign with evidence of bleeding.   Laboratory Data: ABG    Component Value Date/Time   PHART 7.170 (LL) 12/31/2021 0827   PCO2ART 60.2 (H) 12/31/2021 0827   PO2ART 113 (H) 12/31/2021 0827   HCO3 22.0 12/31/2021 0827   TCO2 24 12/31/2021 0827   ACIDBASEDEF 7.0 (H) 12/31/2021 0827   O2SAT 97.0 12/31/2021 0827    High Sensitivity Troponin:   Recent Labs  Lab 12/30/21 2342 12/31/21 0215  TROPONINIHS 5,916* 5,616*  Chemistry Recent Labs  Lab 12/30/21 2342 12/30/21 2348 12/31/21 0005 12/31/21 0827  NA 136 135 135 135  K 4.5 4.3 4.4 4.6  CL 104   --  104  --   CO2 16*  --   --   --   GLUCOSE 97  --  92  --   BUN 128*  --  >130*  --   CREATININE 4.95*  --  4.90*  --   CALCIUM 8.6*  --   --   --   GFRNONAA 9*  --   --   --   ANIONGAP 16*  --   --   --     Recent Labs  Lab 12/30/21 2342  PROT 6.0*  ALBUMIN 3.0*  AST 41  ALT 28  ALKPHOS 151*  BILITOT 1.3*   Lipids No results for input(s): CHOL, TRIG, HDL, LABVLDL, LDLCALC, CHOLHDL in the last 168 hours.  Hematology Recent Labs  Lab 12/30/21 2342 12/30/21 2348 12/31/21 0005 12/31/21 0827  WBC 12.9*  --   --   --   RBC 4.50  --   --   --   HGB 12.8 14.3 14.6 13.3  HCT 41.9 42.0 43.0 39.0  MCV 93.1  --   --   --   MCH 28.4  --   --   --   MCHC 30.5  --   --   --   RDW 15.2  --   --   --   PLT 98*  --   --   --    Thyroid No results for input(s): TSH, FREET4 in the last 168 hours.  BNP Recent Labs  Lab 12/30/21 2342  BNP >4,500.0*    DDimer No results for input(s): DDIMER in the last 168 hours.   Radiology/Studies:  DG Chest 2 View  Result Date: 12/30/2021 CLINICAL DATA:  Altered mental status, slurred speech EXAM: CHEST - 2 VIEW COMPARISON:  08/28/2021 FINDINGS: Cardiomegaly. Vascular congestion. Aortic atherosclerosis. No confluent opacity, effusion or edema. No acute bony abnormality. IMPRESSION: Cardiomegaly, vascular congestion. Electronically Signed   By: Rolm Baptise M.D.   On: 12/30/2021 23:25   CT HEAD WO CONTRAST  Result Date: 12/30/2021 CLINICAL DATA:  Neuro deficit, acute, stroke suspected dysarthria, weakness EXAM: CT HEAD WITHOUT CONTRAST TECHNIQUE: Contiguous axial images were obtained from the base of the skull through the vertex without intravenous contrast. RADIATION DOSE REDUCTION: This exam was performed according to the departmental dose-optimization program which includes automated exposure control, adjustment of the mA and/or kV according to patient size and/or use of iterative reconstruction technique. COMPARISON:  08/05/2018 FINDINGS:  Brain: Old left cerebellar infarct. No acute intracranial abnormality. Specifically, no hemorrhage, hydrocephalus, mass lesion, acute infarction, or significant intracranial injury. Vascular: No hyperdense vessel or unexpected calcification. Skull: No acute calvarial abnormality. Sinuses/Orbits: No acute findings Other: None IMPRESSION: Old left cerebellar infarct. No acute intracranial abnormality. Electronically Signed   By: Rolm Baptise M.D.   On: 12/30/2021 23:26   US RENAL  Result Date: 12/31/2021 CLINICAL DATA:  71 year old female with history of acute kidney injury. EXAM: RENAL / URINARY TRACT ULTRASOUND COMPLETE COMPARISON:  Abdominal ultrasound 08/06/2018. FINDINGS: Right Kidney: Renal measurements: 9.5 x 5.8 x 6.1 cm = volume: 173.2 mL. Echogenicity within normal limits. No mass or hydronephrosis visualized. Left Kidney: Renal measurements: 10.2 x 6.0 x 5.7 cm = volume: 181.8 mL. Echogenicity within normal limits. In the lower pole of the left kidney there is a well-circumscribed 3.0  x 4.0 x 3.1 cm anechoic lesion with increased through transmission, compatible with a simple cyst. No solid renal mass noted. No hydronephrosis visualized. Bladder: Could not be visualized secondary to the patient's large body habitus. Other: None. IMPRESSION: 1. No acute findings.  Specifically, no hydronephrosis. 2. Simple cyst in the lower pole of the left kidney again noted. 3. Limited study which was unable to visualize the urinary bladder. Electronically Signed   By: Vinnie Langton M.D.   On: 12/31/2021 07:27     Assessment and Plan:   Acute on chronic combined CHF -Her weight is up 10 pounds from her base weight - However, her BUN and creatinine are significantly above normal, she appears to be intravascularly dry -Her home dose of Lasix is 80 mg daily with an extra 80 mg for volume overload - She has not been taking the extra dose recently - Her Lasix was held on admission - Discuss plan with MD  2.   Acute renal failure on chronic CKD - Her creatinine is generally between 1.31 and 1.78 - It was 4.95 on admission. -Her BUN is generally in the 20s and 30s - It was 128 on admission and is now greater than 130 - Recommend Nephrology consult  3.  Acute hypoxic respiratory failure - Her pH on admission was 7.137 with a PCO2 of 62, PO2 79 and an acid base deficit of 9. - This has improved a little on BiPAP -CCM has been contacted and asked to see the patient  4.  Non-STEMI - hx NICM w/ no significant CAD by cath 2017 - At this time, her respiratory issues preclude any ischemic evaluation - Review echo and then decide if she needs a cath versus cardiac CT or other evaluation when she improves.   Risk Assessment/Risk Scores:     TIMI Risk Score for Unstable Angina or Non-ST Elevation MI:   The patient's TIMI risk score is  , which indicates a  % risk of all cause mortality, new or recurrent myocardial infarction or need for urgent revascularization in the next 14 days.  New York Heart Association (NYHA) Functional Class NYHA Class IV  For questions or updates, please contact [facility] Please consult [email or web address] for contact info under    Signed, Rosaria Ferries, PA-C  12/31/2021 9:18 AM

## 2021-12-31 NOTE — H&P (Signed)
History and Physical    Patricia Wagner MEQ:[id number] DOB: 01-14-1951  PCP: Janith Lima, MD   Patient coming from: Home  HPI: Patricia Wagner is a, chronic systolic CHF 71 y.o. female with medical history significant of A-fib not on Eliquis due to history of recurrent diverticular GI bleeding, PVCs, CKD stage IIIb, chronic systolic CHF due to nonischemic cardiomyopathy (EF 35 to 40%), rheumatoid arthritis, Raynaud's disease, CVA, COPD, hypertension, hyperlipidemia.  Seen by PCP 5 days ago for diarrhea and had a near syncopal event in the lobby.  Vital signs were checked and noted to be hypoxic.  EMS was called for transport to the ED but patient declined and went home instead.  She returns to the ED yesterday via EMS for evaluation of acute onset slurred speech and right arm weakness, LKW 1500.  In the ED, patient noted to be hypoxic with sats in the 70s to 80s.  Initially placed on supplemental oxygen but ABG revealed pH 7.1, PCO2 62, and PO2 79.  Placed on BiPAP.  Blood pressure soft.  Labs showing WBC 12.9.  No anemia.  Acute onset thrombocytopenia with platelet count 98k, previously normal.  Bicarb 16.  BUN 128, creatinine 4.9 (baseline 1.3-1.5).  UA and UDS pending.  COVID and flu negative.  Blood ethanol level undetectable.  Lactic acid normal.  BNP >4500.  High-sensitivity troponin 5916 >5616.  EKG without acute ischemic changes.  Chest x-ray showing cardiomegaly and vascular congestion; no pulmonary edema.  CT head negative for acute finding.  ED PA discussed the case with Dr. Humphrey Rolls from cardiology who recommended IV heparin and obtaining echocardiogram in the morning.  Stated he will see the patient tonight and make further recommendations.  Regarding slurred speech, ED PA discussed the case with neurology (Dr. Cheral Marker) who felt that this was likely related to hypoxia rather than acute CVA given absence of other focal neurodeficits.  Not able to obtain any history from the patient at this time as  she is on BiPAP and slightly somnolent.  Review of Systems:  All systems reviewed and apart from history of presenting illness, are negative.  Past Medical History:  Diagnosis Date   Acute blood loss anemia 03/05/2018   Acute hypercapnic respiratory failure ([place]) 04/30/2017   Acute renal failure superimposed on chronic kidney disease ([place]) 10/20/2016   Arthritis    hands, knees   Atrial fibrillation ([place])    a. s/p multiple cardioversions; failed tikosyn/sotalol.   Bradycardia 11/20/2016   CAD in native artery, s/p cardiac cath with non obstructive CAD 10/24/2016   Cerebrovascular accident (CVA) due to embolism of left cerebellar artery (HCC)    CHF (congestive heart failure) ([place])    Chronic anticoagulation 03/15/2018   Chronic atrial fibrillation ([place]) 2/97/9892   Chronic systolic (congestive) heart failure ([place]) 03/05/2018   CKD (chronic kidney disease) stage 3, GFR 30-59 ml/min (HCC)    Coagulopathy ([place]) 03/05/2018   DIVERTICULITIS, HX OF 07/25/2007   Diverticulosis of colon with hemorrhage 07/22/2007   Qualifier: Diagnosis of  By: [name]     DIVERTICULOSIS, COLON 07/22/2007   Dysuria 07/21/2017   Edema, peripheral    a. chronic BLE edema, R>L. Prior trauma from dog attack and accident.   Essential hypertension 07/22/2007   Qualifier: Diagnosis of  By: [name]     History of kidney stones    HLD (hyperlipidemia) 02/03/2008   Qualifier: Diagnosis of  By: [name], [name]    HYPERLIPIDEMIA 02/03/2008  Hypersomnia    declines w/u   Hypertension    Hypotension (arterial) 04/30/2017   MENOPAUSAL DISORDER 01/09/2011   Morbid obesity ([place]) 07/22/2007   NICM (nonischemic cardiomyopathy) ([place]) 10/24/2016   PAF (paroxysmal atrial fibrillation) ([place])    Presence of indwelling urinary catheter    supra pubic cath   Raynaud's syndrome 07/22/2007   Stroke ([place]) 2017   Suspected sleep apnea 05/20/2017   THYROID NODULE, RIGHT 01/04/2010   VITAMIN D DEFICIENCY 01/09/2011    Qualifier: Diagnosis of  By: [name], [name]     Past Surgical History:  Procedure Laterality Date   ATRIAL FIBRILLATION ABLATION N/A 10/13/2019   Procedure: ATRIAL FIBRILLATION ABLATION;  Surgeon: [name], MD;  Location: [place] [facility];  Service: Cardiovascular;  Laterality: N/A;   CARDIAC CATHETERIZATION N/A 10/23/2016   Procedure: Left Heart Cath and Coronary Angiography;  Surgeon: [name], MD;  Location: [place] [facility];  Service: Cardiovascular;  Laterality: N/A;   CARDIOVERSION N/A 10/31/2016   Procedure: CARDIOVERSION;  Surgeon: [name], MD;  Location: [place];  Service: Cardiovascular;  Laterality: N/A;   CARDIOVERSION N/A 11/03/2016   Procedure: CARDIOVERSION;  Surgeon: [name], MD;  Location: [place];  Service: Cardiovascular;  Laterality: N/A;   CARDIOVERSION N/A 11/18/2016   Procedure: CARDIOVERSION;  Surgeon: [name], MD;  Location: [facility] ENDOSCOPY;  Service: Cardiovascular;  Laterality: N/A;   CARDIOVERSION N/A 03/25/2017   Procedure: CARDIOVERSION;  Surgeon: [name], MD;  Location: [facility] ENDOSCOPY;  Service: Cardiovascular;  Laterality: N/A;   CARDIOVERSION N/A 05/06/2017   Procedure: CARDIOVERSION;  Surgeon: [name], MD;  Location: [facility] ENDOSCOPY;  Service: Cardiovascular;  Laterality: N/A;   CARDIOVERSION N/A 05/12/2017   Procedure: CARDIOVERSION;  Surgeon: [name], MD;  Location: [facility] ENDOSCOPY;  Service: Cardiovascular;  Laterality: N/A;   CHOLECYSTECTOMY     COLONOSCOPY N/A 12/04/2017   Procedure: COLONOSCOPY;  Surgeon: [name], MD;  Location: [facility] ENDOSCOPY;  Service: Endoscopy;  Laterality: N/A;   COLONOSCOPY     COLONOSCOPY W/ POLYPECTOMY  02/2011   pan diverticulosis.  tubular adenoma without dysplasia on 5 mm sigmoid polyp.  Dr Fuller Plan.     ESOPHAGOGASTRODUODENOSCOPY (EGD) WITH PROPOFOL N/A 05/16/2020   Procedure: ESOPHAGOGASTRODUODENOSCOPY (EGD) WITH PROPOFOL;  Surgeon: Milus Banister, MD;  Location: [facility] ENDOSCOPY;  Service: Endoscopy;  Laterality: N/A;   FLEXIBLE SIGMOIDOSCOPY N/A 01/18/2020   Procedure: FLEXIBLE SIGMOIDOSCOPY;  Surgeon: [name], DO;  Location: [place];  Service: Gastroenterology;  Laterality: N/A;   HEMOSTASIS CLIP PLACEMENT  01/18/2020   Procedure: HEMOSTASIS CLIP PLACEMENT;  Surgeon: [name], DO;  Location: [place];  Service: Gastroenterology;;   HOT HEMOSTASIS N/A 05/16/2020   Procedure: HOT HEMOSTASIS (ARGON PLASMA COAGULATION/BICAP);  Surgeon: Milus Banister, MD;  Location: [facility] ENDOSCOPY;  Service: Endoscopy;  Laterality: N/A;   PARTIAL HYSTERECTOMY     1 OVARY LEFT   SCLEROTHERAPY  01/18/2020   Procedure: SCLEROTHERAPY;  Surgeon: [name], DO;  Location: [place] ENDOSCOPY;  Service: Gastroenterology;;   TEE WITHOUT CARDIOVERSION N/A 10/31/2016   Procedure: TRANSESOPHAGEAL ECHOCARDIOGRAM (TEE);  Surgeon: [name], MD;  Location: [facility] ENDOSCOPY;  Service: Cardiovascular;  Laterality: N/A;   TEE WITHOUT CARDIOVERSION N/A 03/25/2017   Procedure: TRANSESOPHAGEAL ECHOCARDIOGRAM (TEE);  Surgeon: [name], MD;  Location: [facility] ENDOSCOPY;  Service: Cardiovascular;  Laterality: N/A;   TOTAL KNEE ARTHROPLASTY Right 04/10/2020   Procedure: TOTAL KNEE ARTHROPLASTY;  Surgeon:  Renette Butters, MD;  Location: [facility] ORS;  Service: Orthopedics;  Laterality: Right;     reports that she quit smoking about 4 years ago. Her smoking use included cigarettes. She smoked an average of .5 packs per day. She has never used smokeless tobacco. She reports current alcohol use. She reports that she does not use drugs.  Allergies  Allergen Reactions   Ace Inhibitors Palpitations    Family History  Problem Relation Age of Onset   Asthma Mother    Diabetes Father    Heart disease Father        Died of presumed heart attack - 64s   Lung disease Sister    Heart disease Sister        Twin sister has heart issue, unclear what  kind   Healthy Daughter    Thyroid disease Neg Hx    Colon polyps Neg Hx    Esophageal cancer Neg Hx    Pancreatic cancer Neg Hx    Stomach cancer Neg Hx     Prior to Admission medications   Medication Sig Start Date End Date Taking? Authorizing Provider  Acetaminophen (TYLENOL PO) Take by mouth as needed.     [provider]  Cholecalciferol (VITAMIN D3) 25 MCG (1000 UT) CAPS Take 1,000 Units by mouth daily. Patient not taking: Reported on 10/15/2021    [provider]  furosemide (LASIX) 80 MG tablet Take 1 tablet (80 mg total) by mouth daily. Take extra tab in PM as needed 08/12/21   Bensimhon, Shaune Pascal, MD  JARDIANCE 10 MG TABS tablet TAKE 1 TABLET BY MOUTH DAILY BEFORE BREAKFAST. Patient taking differently: Take 10 mg by mouth daily. 08/26/21   Bensimhon, Shaune Pascal, MD  leflunomide (ARAVA) 20 MG tablet Take 1 tablet (20 mg total) by mouth daily. 10/15/21   Ofilia Neas, PA-C  Potassium Chloride ER 20 MEQ TBCR TAKE 1 TABLET BY MOUTH EVERY DAY Patient taking differently: Take 20 mEq by mouth daily. 09/27/21   Bensimhon, Shaune Pascal, MD  predniSONE (DELTASONE) 5 MG tablet Take 1 tablet (5 mg total) by mouth daily with breakfast. 10/15/21   Ofilia Neas, PA-C  rosuvastatin (CRESTOR) 10 MG tablet Take 1 tablet (10 mg total) by mouth daily. 05/23/21   Janith Lima, MD  sacubitril-valsartan (ENTRESTO) 24-26 MG Take 1 tablet by mouth 2 (two) times daily. 08/12/21   Bensimhon, Shaune Pascal, MD    Physical Exam: Vitals:   12/31/21 0600 12/31/21 0700 12/31/21 0730 12/31/21 0800  BP: (!) 95/58 99/62 102/71 (!) 96/45  Pulse: 79 70 76 75  Resp: 18 18 20 14   Temp:      TempSrc:      SpO2: 97% 96% 94% 92%  Weight:      Height:        Physical Exam Constitutional:      General: She is not in acute distress. HENT:     Head: Normocephalic and atraumatic.  Eyes:     Extraocular Movements: Extraocular movements intact.     Conjunctiva/sclera: Conjunctivae normal.   Cardiovascular:     Rate and Rhythm: Normal rate and regular rhythm.     Pulses: Normal pulses.  Pulmonary:     Effort: Pulmonary effort is normal. No respiratory distress.     Breath sounds: Rales present. No wheezing.  Abdominal:     General: Bowel sounds are normal. There is no distension.     Palpations: Abdomen is soft.  Tenderness: There is no abdominal tenderness.  Musculoskeletal:     Cervical back: Normal range of motion and neck supple.     Right lower leg: Edema present.     Left lower leg: Edema present.  Skin:    General: Skin is warm and dry.  Neurological:     Mental Status: She is alert.     Comments: Somnolent but arousable Moving all extremities on command, no focal weakness Unable to assess speech at this time as she is on BiPAP     Labs on Admission: I have personally reviewed following labs and imaging studies  CBC: Recent Labs  Lab 12/30/21 2342 12/30/21 2348 12/31/21 0005 12/31/21 0827  WBC 12.9*  --   --   --   NEUTROABS 11.5*  --   --   --   HGB 12.8 14.3 14.6 13.3  HCT 41.9 42.0 43.0 39.0  MCV 93.1  --   --   --   PLT 98*  --   --   --    Basic Metabolic Panel: Recent Labs  Lab 12/30/21 2342 12/30/21 2348 12/31/21 0005 12/31/21 0827  NA 136 135 135 135  K 4.5 4.3 4.4 4.6  CL 104  --  104  --   CO2 16*  --   --   --   GLUCOSE 97  --  92  --   BUN 128*  --  >130*  --   CREATININE 4.95*  --  4.90*  --   CALCIUM 8.6*  --   --   --    GFR: Estimated Creatinine Clearance: 12.6 mL/min (A) (by C-G formula based on SCr of 4.9 mg/dL (H)). Liver Function Tests: Recent Labs  Lab 12/30/21 2342  AST 41  ALT 28  ALKPHOS 151*  BILITOT 1.3*  PROT 6.0*  ALBUMIN 3.0*   No results for input(s): LIPASE, AMYLASE in the last 168 hours. No results for input(s): AMMONIA in the last 168 hours. Coagulation Profile: Recent Labs  Lab 12/30/21 2342  INR 1.5*   Cardiac Enzymes: No results for input(s): CKTOTAL, CKMB, CKMBINDEX, TROPONINI in  the last 168 hours. BNP (last 3 results) No results for input(s): PROBNP in the last 8760 hours. HbA1C: No results for input(s): HGBA1C in the last 72 hours. CBG: Recent Labs  Lab 12/30/21 2345  GLUCAP 96   Lipid Profile: No results for input(s): CHOL, HDL, LDLCALC, TRIG, CHOLHDL, LDLDIRECT in the last 72 hours. Thyroid Function Tests: No results for input(s): TSH, T4TOTAL, FREET4, T3FREE, THYROIDAB in the last 72 hours. Anemia Panel: No results for input(s): VITAMINB12, FOLATE, FERRITIN, TIBC, IRON, RETICCTPCT in the last 72 hours. Urine analysis:    Component Value Date/Time   COLORURINE AMBER (A) 12/31/2021 0743   APPEARANCEUR CLOUDY (A) 12/31/2021 0743   LABSPEC 1.013 12/31/2021 0743   PHURINE 6.0 12/31/2021 0743   GLUCOSEU NEGATIVE 12/31/2021 0743   GLUCOSEU NEGATIVE 05/22/2021 1435   HGBUR NEGATIVE 12/31/2021 0743   BILIRUBINUR NEGATIVE 12/31/2021 0743   KETONESUR NEGATIVE 12/31/2021 0743   PROTEINUR >=300 (A) 12/31/2021 0743   UROBILINOGEN 0.2 05/22/2021 1435   NITRITE NEGATIVE 12/31/2021 0743   LEUKOCYTESUR LARGE (A) 12/31/2021 0743    Radiological Exams on Admission: I have personally reviewed images DG Chest 2 View  Result Date: 12/30/2021 CLINICAL DATA:  Altered mental status, slurred speech EXAM: CHEST - 2 VIEW COMPARISON:  08/28/2021 FINDINGS: Cardiomegaly. Vascular congestion. Aortic atherosclerosis. No confluent opacity, effusion or edema. No acute bony abnormality.  IMPRESSION: Cardiomegaly, vascular congestion. Electronically Signed   By: Rolm Baptise M.D.   On: 12/30/2021 23:25   CT HEAD WO CONTRAST  Result Date: 12/30/2021 CLINICAL DATA:  Neuro deficit, acute, stroke suspected dysarthria, weakness EXAM: CT HEAD WITHOUT CONTRAST TECHNIQUE: Contiguous axial images were obtained from the base of the skull through the vertex without intravenous contrast. RADIATION DOSE REDUCTION: This exam was performed according to the departmental dose-optimization program  which includes automated exposure control, adjustment of the mA and/or kV according to patient size and/or use of iterative reconstruction technique. COMPARISON:  08/05/2018 FINDINGS: Brain: Old left cerebellar infarct. No acute intracranial abnormality. Specifically, no hemorrhage, hydrocephalus, mass lesion, acute infarction, or significant intracranial injury. Vascular: No hyperdense vessel or unexpected calcification. Skull: No acute calvarial abnormality. Sinuses/Orbits: No acute findings Other: None IMPRESSION: Old left cerebellar infarct. No acute intracranial abnormality. Electronically Signed   By: Rolm Baptise M.D.   On: 12/30/2021 23:26   US RENAL  Result Date: 12/31/2021 CLINICAL DATA:  71 year old female with history of acute kidney injury. EXAM: RENAL / URINARY TRACT ULTRASOUND COMPLETE COMPARISON:  Abdominal ultrasound 08/06/2018. FINDINGS: Right Kidney: Renal measurements: 9.5 x 5.8 x 6.1 cm = volume: 173.2 mL. Echogenicity within normal limits. No mass or hydronephrosis visualized. Left Kidney: Renal measurements: 10.2 x 6.0 x 5.7 cm = volume: 181.8 mL. Echogenicity within normal limits. In the lower pole of the left kidney there is a well-circumscribed 3.0 x 4.0 x 3.1 cm anechoic lesion with increased through transmission, compatible with a simple cyst. No solid renal mass noted. No hydronephrosis visualized. Bladder: Could not be visualized secondary to the patient's large body habitus. Other: None. IMPRESSION: 1. No acute findings.  Specifically, no hydronephrosis. 2. Simple cyst in the lower pole of the left kidney again noted. 3. Limited study which was unable to visualize the urinary bladder. Electronically Signed   By: Vinnie Langton M.D.   On: 12/31/2021 07:27    EKG: I have personally reviewed EKG: Sinus rhythm, PVCs, no acute ischemic changes.  Assessment and Plan: * Acute on chronic combined systolic and diastolic CHF (congestive heart failure) ([place])- (present on  admission) Mild to moderate mitral regurgitation Acute hypoxic hypercapnic respiratory failure Hypoxic in the 70s to 80s on room air.  Blood gas showing pH 7.1, PCO2 62, and PO2 79.  Currently stable on BiPAP.  Blood pressure low with systolic in the 72Z.  BNP >4500.  Concern for possible cardiogenic shock, however, extremities warm to touch and lactic acid normal. Chest x-ray showing cardiomegaly and vascular congestion. Echo done September 2022 showing EF 35 to 40%, global hypokinesis, grade 1 diastolic dysfunction, mild to moderate mitral regurgitation. -Continue BiPAP.  Avoiding diuretics at this time due to acute kidney injury and soft blood pressure.  Hold Jardiance and [place].  Repeat echocardiogram.  Cardiology consulted.  Acute renal failure superimposed on stage 3b chronic kidney disease ([place])- (present on admission) BUN 128, creatinine 4.9 (baseline 1.3-1.5).  Patient is on Lasix 80 mg daily. -Avoid diuretics at this time.  Consult nephrology in the morning.  Check renal ultrasound, UA, urine sodium and creatinine.  CAD in native artery, s/p cardiac cath with non obstructive CAD- (present on admission) Elevated troponin likely due to demand ischemia from decompensated heart failure.  Troponin elevated but stable (5916 >5616).  Patient is not endorsing chest pain.  Cardiac catheterization done in 2017 showing mild nonobstructive CAD.  Started on IV heparin in the ED due to concern for possible ACS. -  Continue IV heparin, echocardiogram, cardiology consulted  Thrombocytopenia ([place])- (present on admission) Platelet count 98k, previously normal.  No signs of active bleeding. -On IV heparin at this time, monitor platelet count closely.  Peripheral blood smear with pathologist review.  Slurred speech- (present on admission) Blood ethanol level undetectable.  CT head negative for acute finding.  Unable to assess the patient's speech at this time as she is on BiPAP, no other focal  neurodeficit.  Neurology felt that this was likely related to hypoxia rather than acute CVA. -Continue to monitor.  UDS pending.  Consider obtaining brain MRI if slurred speech persists.  Leukocytosis- (present on admission) Mild and likely reactive.  Chest x-ray not suggestive of pneumonia. -UA pending  PAF (paroxysmal atrial fibrillation) ([place])- (present on admission) Status post ablation in 2020 and amiodarone was stopped.  Not on Eliquis due to history of recurrent diverticular GI bleeding.  Remains in sinus rhythm. -Continue to monitor    DVT prophylaxis: IV heparin gtt Code Status: Full Code.  Unable to discuss CODE STATUS with the patient at this time.  Please readdress in the morning. Family Communication: No family available at this time. Consults called: Cardiology Level of care: Progressive Care Unit Admission status: It is my clinical opinion that admission to INPATIENT is reasonable and necessary because of the expectation that this patient will require hospital care that crosses at least 2 midnights to treat this condition based on the medical complexity of the problems presented.  Given the aforementioned information, the predictability of an adverse outcome is felt to be significant.   Shela Leff, MD [facility] 12/31/2021, 8:57 AM

## 2021-12-31 NOTE — Consult Note (Signed)
Cardiology Consultation:   Patient ID: Patricia Wagner MRN: [id number]; DOB: 1951-06-16  Admit date: 12/30/2021 Date of Consult: 12/31/2021  PCP:  Janith Lima, MD   [facility] Providers Cardiologist:  Glori Bickers, MD  Electrophysiologist:  [name], MD        Patient Profile:   Patricia Wagner is a 71 y.o. female with a hx of of A-fib s/p ablation 2020, not on Eliquis due to history of recurrent diverticular GI bleeding, PVCs, CKD stage IIIb w/ Cr +/- 1.5, chronic systolic CHF due to nonischemic cardiomyopathy (EF 37 to 40%), rheumatoid arthritis, Raynaud's disease, CVA, COPD, hypertension, hyperlipidemia, urinary retention, who is being seen 12/31/2021 for the evaluation of CHF at the request of Dr Pietro Cassis.  History of Present Illness:   Patricia Wagner had an EF of 15% with severe biventricular dysfunction in 2018, this was felt tachycardia mediated due to A-fib.  [name] last saw her 08/12/2021, respiratory status was at baseline with a weight of 209 pounds, EF 30%, amyloid work-up negative.  Continue Lasix 80 mg daily, take twice daily as needed for edema When seen by Dr. Curt Bears 09/2021, she was maintaining sinus rhythm.  She has recently been struggling with diarrhea, saw her PCP 5 days ago and had a near syncopal event that day.  Refused transport.  Came to the ER 02/06 with slurred speech and right arm weakness, O2 sats in the 70s, placed on BiPAP.  BNP greater than 4500, significantly elevated troponin greater than 5000, cards asked to see.  Patricia Wagner has been having diarrhea for over a week.  Just before this admission, the symptoms and started to ease.  There was no nausea or vomiting with it.  She was able to drink liquids but her p.o. intake was decreased because of the diarrhea.  She is breathing a little bit better now.  She is thirsty.  She does not know how much weight she has gained, but thinks it has gone up.  The respiratory symptoms started fairly  suddenly, within the last couple of days.  She does not like being on the BiPAP, but feels a little better with it.  No chest pain.   Past Medical History:  Diagnosis Date   Acute blood loss anemia 03/05/2018   Acute hypercapnic respiratory failure ([place]) 04/30/2017   Acute renal failure superimposed on chronic kidney disease ([place]) 10/20/2016   Arthritis    hands, knees   Atrial fibrillation ([place])    a. s/p multiple cardioversions; failed tikosyn/sotalol.   Bradycardia 11/20/2016   CAD in native artery, s/p cardiac cath with non obstructive CAD 10/24/2016   Cerebrovascular accident (CVA) due to embolism of left cerebellar artery (HCC)    CHF (congestive heart failure) ([place])    Chronic anticoagulation 03/15/2018   Chronic atrial fibrillation ([place]) 06/05/4579   Chronic systolic (congestive) heart failure ([place]) 03/05/2018   CKD (chronic kidney disease) stage 3, GFR 30-59 ml/min (HCC)    Coagulopathy ([place]) 03/05/2018   DIVERTICULITIS, HX OF 07/25/2007   Diverticulosis of colon with hemorrhage 07/22/2007   Qualifier: Diagnosis of  By: [name]     DIVERTICULOSIS, COLON 07/22/2007   Dysuria 07/21/2017   Edema, peripheral    a. chronic BLE edema, R>L. Prior trauma from dog attack and accident.   Essential hypertension 07/22/2007   Qualifier: Diagnosis of  By: [name]     History of kidney stones    HLD (hyperlipidemia) 02/03/2008   Qualifier: Diagnosis of  By: [name], [name]    HYPERLIPIDEMIA 02/03/2008   Hypersomnia    declines w/u   Hypertension    Hypotension (arterial) 04/30/2017   MENOPAUSAL DISORDER 01/09/2011   Morbid obesity ([place]) 07/22/2007   NICM (nonischemic cardiomyopathy) ([place]) 10/24/2016   PAF (paroxysmal atrial fibrillation) ([place])    Presence of indwelling urinary catheter    supra pubic cath   Raynaud's syndrome 07/22/2007   Stroke ([place]) 2017   Suspected sleep apnea 05/20/2017   THYROID NODULE, RIGHT 01/04/2010   VITAMIN D DEFICIENCY 01/09/2011   Qualifier:  Diagnosis of  By: [name], [name]     Past Surgical History:  Procedure Laterality Date   ATRIAL FIBRILLATION ABLATION N/A 10/13/2019   Procedure: ATRIAL FIBRILLATION ABLATION;  Surgeon: [name], MD;  Location: [place] CV LAB;  Service: Cardiovascular;  Laterality: N/A;   CARDIAC CATHETERIZATION N/A 10/23/2016   Procedure: Left Heart Cath and Coronary Angiography;  Surgeon: [name], MD;  Location: [place] CV LAB;  Service: Cardiovascular;  Laterality: N/A;   CARDIOVERSION N/A 10/31/2016   Procedure: CARDIOVERSION;  Surgeon: [name], MD;  Location: [place];  Service: Cardiovascular;  Laterality: N/A;   CARDIOVERSION N/A 11/03/2016   Procedure: CARDIOVERSION;  Surgeon: [name], MD;  Location: [place];  Service: Cardiovascular;  Laterality: N/A;   CARDIOVERSION N/A 11/18/2016   Procedure: CARDIOVERSION;  Surgeon: [name], MD;  Location[facility] ENDOSCOPY;  Service: Cardiovascular;  Laterality: N/A;   CARDIOVERSION N/A 03/25/2017   Procedure: CARDIOVERSION;  Surgeon: [name], MD;  Location: [facility] ENDOSCOPY;  Service: Cardiovascular;  Laterality: N/A;   CARDIOVERSION N/A 05/06/2017   Procedure: CARDIOVERSION;  Surgeon: [name], MD;  Location: [facility] ENDOSCOPY;  Service: Cardiovascular;  Laterality: N/A;   CARDIOVERSION N/A 05/12/2017   Procedure: CARDIOVERSION;  Surgeon: [name], MD;  Location: [facility] ENDOSCOPY;  Service: Cardiovascular;  Laterality: N/A;   CHOLECYSTECTOMY     COLONOSCOPY N/A 12/04/2017   Procedure: COLONOSCOPY;  Surgeon: [name], MD;  Location: [facility] ENDOSCOPY;  Service: Endoscopy;  Laterality: N/A;   COLONOSCOPY     COLONOSCOPY W/ POLYPECTOMY  02/2011   pan diverticulosis.  tubular adenoma without dysplasia on 5 mm sigmoid polyp.  Dr Fuller Plan.     ESOPHAGOGASTRODUODENOSCOPY (EGD) WITH PROPOFOL N/A 05/16/2020   Procedure: ESOPHAGOGASTRODUODENOSCOPY (EGD) WITH PROPOFOL;  Surgeon: Milus Banister,  MD;  Location: [facility] ENDOSCOPY;  Service: Endoscopy;  Laterality: N/A;   FLEXIBLE SIGMOIDOSCOPY N/A 01/18/2020   Procedure: FLEXIBLE SIGMOIDOSCOPY;  Surgeon: [name], DO;  Location: [place];  Service: Gastroenterology;  Laterality: N/A;   HEMOSTASIS CLIP PLACEMENT  01/18/2020   Procedure: HEMOSTASIS CLIP PLACEMENT;  Surgeon: [name], DO;  Location: [place];  Service: Gastroenterology;;   HOT HEMOSTASIS N/A 05/16/2020   Procedure: HOT HEMOSTASIS (ARGON PLASMA COAGULATION/BICAP);  Surgeon: Milus Banister, MD;  Location: [facility] ENDOS[facility];  Service: Endoscopy;  Laterality: N/A;   PARTIAL HYSTERECTOMY     1 OVARY LEFT   SCLEROTHERAPY  01/18/2020   Procedure: SCLEROTHERAPY;  Surgeon: [name], DO;  Location: [place] ENDOSCOPY;  Service: Gastroenterology;;   TEE WITHOUT CARDIOVERSION N/A 10/31/2016   Procedure: TRANSESOPHAGEAL ECHOCARDIOGRAM (TEE);  Surgeon: [name], MD;  Location: [facility] ENDOSCOPY;  Service: Cardiovascular;  Laterality: N/A;   TEE WITHOUT CARDIOVERSION N/A 03/25/2017   Procedure: TRANSESOPHAGEAL ECHOCARDIOGRAM (TEE);  Surgeon: [name], MD;  Location: [facility] ENDOSCOPY;  Service: Cardiovascular;  Laterality: N/A;   TOTAL  KNEE ARTHROPLASTY Right 04/10/2020   Procedure: TOTAL KNEE ARTHROPLASTY;  Surgeon: Renette Butters, MD;  Location: [facility] ORS;  Service: Orthopedics;  Laterality: Right;     Home Medications:  Prior to Admission medications   Medication Sig Start Date End Date Taking? Authorizing Provider  Acetaminophen (TYLENOL PO) Take 1 tablet by mouth every 6 (six) hours as needed (headache/pain).    [provider]  furosemide (LASIX) 80 MG tablet Take 1 tablet (80 mg total) by mouth daily. Take extra tab in PM as needed 08/12/21   Bensimhon, Shaune Pascal, MD  JARDIANCE 10 MG TABS tablet TAKE 1 TABLET BY MOUTH DAILY BEFORE BREAKFAST. Patient taking differently: Take 10 mg by mouth daily. 08/26/21   Bensimhon, Shaune Pascal, MD  leflunomide  (ARAVA) 20 MG tablet Take 1 tablet (20 mg total) by mouth daily. 10/15/21   Ofilia Neas, PA-C  Potassium Chloride ER 20 MEQ TBCR TAKE 1 TABLET BY MOUTH EVERY DAY Patient taking differently: Take 20 mEq by mouth daily. 09/27/21   Bensimhon, Shaune Pascal, MD  predniSONE (DELTASONE) 5 MG tablet Take 1 tablet (5 mg total) by mouth daily with breakfast. Patient taking differently: Take 5 mg by mouth daily with breakfast. Continuous 10/15/21   Ofilia Neas, PA-C  rosuvastatin (CRESTOR) 10 MG tablet Take 1 tablet (10 mg total) by mouth daily. 05/23/21   Janith Lima, MD  sacubitril-valsartan (ENTRESTO) 24-26 MG Take 1 tablet by mouth 2 (two) times daily. 08/12/21   Bensimhon, Shaune Pascal, MD    Inpatient Medications: Scheduled Meds:  chlorhexidine  15 mL Mouth Rinse BID   mouth rinse  15 mL Mouth Rinse q12n4p   Continuous Infusions:  heparin 1,100 Units/hr (12/31/21 1100)    sodium bicarbonate (isotonic) infusion in sterile water     PRN Meds: acetaminophen **OR** acetaminophen  Allergies:    Allergies  Allergen Reactions   Ace Inhibitors Palpitations    Social History:   Social History   Socioeconomic History   Marital status: Single    Spouse name: Not on file   Number of children: 1   Years of education: Not on file   Highest education level: Not on file  Occupational History   Occupation: Academic librarian   Tobacco Use   Smoking status: Former    Packs/day: 0.50    Types: Cigarettes    Quit date: 2019    Years since quitting: 4.1   Smokeless tobacco: Never  Vaping Use   Vaping Use: Never used  Substance and Sexual Activity   Alcohol use: Yes    Comment: occ   Drug use: No   Sexual activity: Not on file  Other Topics Concern   Not on file  Social History Narrative   Not on file   Social Determinants of Health   Financial Resource Strain: Not on file  Food Insecurity: No Food Insecurity   Worried About Running Out of Food in the Last Year: Never true   [place]  in the Last Year: Never true  Transportation Needs: No Transportation Needs   Lack of Transportation (Medical): No   Lack of Transportation (Non-Medical): No  Physical Activity: Not on file  Stress: Not on file  Social Connections: Not on file  Intimate Partner Violence: Not on file    Family History:   Family History  Problem Relation Age of Onset   Asthma Mother    Diabetes Father    Heart disease Father  Died of presumed heart attack - 97s   Lung disease Sister    Heart disease Sister        Twin sister has heart issue, unclear what kind   Healthy Daughter    Thyroid disease Neg Hx    Colon polyps Neg Hx    Esophageal cancer Neg Hx    Pancreatic cancer Neg Hx    Stomach cancer Neg Hx      ROS:  Please see the history of present illness.  All other ROS reviewed and negative.     Physical Exam/Data:   Vitals:   12/31/21 0800 12/31/21 0900 12/31/21 1000 12/31/21 1036  BP: (!) 96/45 (!) 94/55 (!) 101/53 (!) 136/113  Pulse: 75 70 75   Resp: 14 17 18 16   Temp:      TempSrc:      SpO2: 92% 95% 98%   Weight:      Height:        Intake/Output Summary (Last 24 hours) at 12/31/2021 1106 Last data filed at 12/31/2021 1100 Gross per 24 hour  Intake 96.04 ml  Output --  Net 96.04 ml   Last 3 Weights 12/30/2021 10/15/2021 09/30/2021  Weight (lbs) 218 lb 0.6 oz 218 lb 215 lb 6.4 oz  Weight (kg) 98.9 kg 98.884 kg 97.705 kg     Body mass index is 37.43 kg/m.  General:  Well nourished, well developed, in respiratory distress HEENT: normal Neck:  JVD appears elevated, but difficult to assess secondary to equipment Vascular: No carotid bruits; Distal pulses 2+ bilaterally Cardiac:  normal S1, S2; RRR; no murmur  Lungs: Difficult to hear secondary to the noise of the BiPAP, but has some rails  Abd: soft, nontender, no hepatomegaly  Ext: Trace edema Musculoskeletal:  No deformities, BUE and BLE strength normal and equal Skin: warm and dry  Neuro:  CNs 2-12 intact, no  focal abnormalities noted Psych:  Normal affect   EKG:  The EKG was personally reviewed and demonstrates: Sinus rhythm, heart rate 87 Telemetry:  Telemetry was personally reviewed and demonstrates: Sinus rhythm with PVCs in pairs  Relevant CV Studies:  ECHO: Ordered  ECHO: 08/12/2021  1. Left ventricular ejection fraction, by estimation, is 35 to 40%. The  left ventricle has moderately decreased function. The left ventricle  demonstrates global hypokinesis. The left ventricular internal cavity size was mildly dilated. Left ventricular diastolic parameters are consistent with Grade I diastolic dysfunction (impaired relaxation).   2. Right ventricular systolic function is normal. The right ventricular  size is normal.   3. Left atrial size was severely dilated.   4. Right atrial size was severely dilated.   5. The mitral valve is normal in structure. Mild to moderate mitral valve regurgitation.   6. The aortic valve is normal in structure. Aortic valve regurgitation is not visualized. No aortic stenosis is present.   CARDIAC CATH: 10/23/2016 Conclusions: Mild, non-obstructive coronary artery disease consistent with non-ischemic cardiomyopathy. Mildly elevated left ventricular filling pressure.   Recommendations: Continue medical treatment of non-ischemic cardiomyopathy and atrial fibrillation with rapid ventricular response. Primary prevention to prevent progression of mild CAD. Restart apixaban at 2200 tonight if right radial arteriotomy site is benign with evidence of bleeding.   Laboratory Data: ABG    Component Value Date/Time   PHART 7.170 (LL) 12/31/2021 0827   PCO2ART 60.2 (H) 12/31/2021 0827   PO2ART 113 (H) 12/31/2021 0827   HCO3 22.0 12/31/2021 0827   TCO2 24 12/31/2021 0827   ACIDBASEDEF  7.0 (H) 12/31/2021 0827   O2SAT 97.0 12/31/2021 0827    High Sensitivity Troponin:   Recent Labs  Lab 12/30/21 2342 12/31/21 0215  TROPONINIHS 5,916* 5,616*       Chemistry Recent Labs  Lab 12/30/21 2342 12/30/21 2348 12/31/21 0005 12/31/21 0827  NA 136 135 135 135  K 4.5 4.3 4.4 4.6  CL 104  --  104  --   CO2 16*  --   --   --   GLUCOSE 97  --  92  --   BUN 128*  --  >130*  --   CREATININE 4.95*  --  4.90*  --   CALCIUM 8.6*  --   --   --   GFRNONAA 9*  --   --   --   ANIONGAP 16*  --   --   --      Recent Labs  Lab 12/30/21 2342  PROT 6.0*  ALBUMIN 3.0*  AST 41  ALT 28  ALKPHOS 151*  BILITOT 1.3*    Lipids No results for input(s): CHOL, TRIG, HDL, LABVLDL, LDLCALC, CHOLHDL in the last 168 hours.  Hematology Recent Labs  Lab 12/30/21 2342 12/30/21 2348 12/31/21 0005 12/31/21 0827  WBC 12.9*  --   --   --   RBC 4.50  --   --   --   HGB 12.8 14.3 14.6 13.3  HCT 41.9 42.0 43.0 39.0  MCV 93.1  --   --   --   MCH 28.4  --   --   --   MCHC 30.5  --   --   --   RDW 15.2  --   --   --   PLT 98*  --   --   --     Thyroid No results for input(s): TSH, FREET4 in the last 168 hours.  BNP Recent Labs  Lab 12/30/21 2342  BNP >4,500.0*     DDimer No results for input(s): DDIMER in the last 168 hours.   Radiology/Studies:  DG Chest 2 View  Result Date: 12/30/2021 CLINICAL DATA:  Altered mental status, slurred speech EXAM: CHEST - 2 VIEW COMPARISON:  08/28/2021 FINDINGS: Cardiomegaly. Vascular congestion. Aortic atherosclerosis. No confluent opacity, effusion or edema. No acute bony abnormality. IMPRESSION: Cardiomegaly, vascular congestion. Electronically Signed   By: Rolm Baptise M.D.   On: 12/30/2021 23:25   CT HEAD WO CONTRAST  Result Date: 12/30/2021 CLINICAL DATA:  Neuro deficit, acute, stroke suspected dysarthria, weakness EXAM: CT HEAD WITHOUT CONTRAST TECHNIQUE: Contiguous axial images were obtained from the base of the skull through the vertex without intravenous contrast. RADIATION DOSE REDUCTION: This exam was performed according to the departmental dose-optimization program which includes automated exposure  control, adjustment of the mA and/or kV according to patient size and/or use of iterative reconstruction technique. COMPARISON:  08/05/2018 FINDINGS: Brain: Old left cerebellar infarct. No acute intracranial abnormality. Specifically, no hemorrhage, hydrocephalus, mass lesion, acute infarction, or significant intracranial injury. Vascular: No hyperdense vessel or unexpected calcification. Skull: No acute calvarial abnormality. Sinuses/Orbits: No acute findings Other: None IMPRESSION: Old left cerebellar infarct. No acute intracranial abnormality. Electronically Signed   By: Rolm Baptise M.D.   On: 12/30/2021 23:26   US RENAL  Result Date: 12/31/2021 CLINICAL DATA:  71 year old female with history of acute kidney injury. EXAM: RENAL / URINARY TRACT ULTRASOUND COMPLETE COMPARISON:  Abdominal ultrasound 08/06/2018. FINDINGS: Right Kidney: Renal measurements: 9.5 x 5.8 x 6.1 cm = volume: 173.2 mL. Echogenicity  within normal limits. No mass or hydronephrosis visualized. Left Kidney: Renal measurements: 10.2 x 6.0 x 5.7 cm = volume: 181.8 mL. Echogenicity within normal limits. In the lower pole of the left kidney there is a well-circumscribed 3.0 x 4.0 x 3.1 cm anechoic lesion with increased through transmission, compatible with a simple cyst. No solid renal mass noted. No hydronephrosis visualized. Bladder: Could not be visualized secondary to the patient's large body habitus. Other: None. IMPRESSION: 1. No acute findings.  Specifically, no hydronephrosis. 2. Simple cyst in the lower pole of the left kidney again noted. 3. Limited study which was unable to visualize the urinary bladder. Electronically Signed   By: Vinnie Langton M.D.   On: 12/31/2021 07:27     Assessment and Plan:   Acute on chronic combined CHF -Her weight is up 10 pounds from her base weight - However, her BUN and creatinine are significantly above normal, she appears to be intravascularly dry -Her home dose of Lasix is 80 mg daily with an  extra 80 mg for volume overload - She has not been taking the extra dose recently - Her Lasix was held on admission - Discuss plan with MD  2.  Acute renal failure on chronic CKD - Her creatinine is generally between 1.31 and 1.78 - It was 4.95 on admission. -Her BUN is generally in the 20s and 30s - It was 128 on admission and is now greater than 130 - Recommend Nephrology consult  3.  Acute hypoxic respiratory failure - Her pH on admission was 7.137 with a PCO2 of 62, PO2 79 and an acid base deficit of 9. - This has improved a little on BiPAP -CCM has been contacted and asked to see the patient  4.  Non-STEMI - hx NICM w/ no significant CAD by cath 2017 - At this time, her respiratory issues preclude any ischemic evaluation - Review echo and then decide if she needs a cath versus cardiac CT or other evaluation when she improves.   Risk Assessment/Risk Scores:     TIMI Risk Score for Unstable Angina or Non-ST Elevation MI:   The patient's TIMI risk score is 3, which indicates a 13% risk of all cause mortality, new or recurrent myocardial infarction or need for urgent revascularization in the next 14 days.  New York Heart Association (NYHA) Functional Class NYHA Class IV  For questions or updates, please contact [facility] Please consult [email or web address] for contact info under  Signed, Rosaria Ferries 12/31/2021 11:06 AM As above, patient seen and examined.  Briefly she is a 71 year old female with past medical history of atrial fibrillation status post ablation (note patient developed cardiogenic shock with atrial fibrillation in the past), nonischemic cardiomyopathy, history of recurrent diverticular GI bleeding and therefore not anticoagulated, PVCs, chronic stage IIIb kidney disease, chronic systolic congestive heart failure, rheumatoid arthritis, Raynaud's, prior CVA, COPD, hypertension, hyperlipidemia, urinary retention for evaluation of question acute on chronic combined  systolic/diastolic congestive heart failure.  Cardiac catheterization November 2017 showed mild nonobstructive coronary disease.  Last echocardiogram September 2022 showed ejection fraction 35 to 40%, mild left ventricular enlargement, grade 1 diastolic dysfunction, severe biatrial enlargement, mild to moderate mitral regurgitation.  Patient apparently at baseline until approximately 5 to 7 days ago when she developed diarrhea and decreased appetite.  She apparently had a near syncopal episode and was also noted to be hypoxic and EMS transfer to the ED was recommended but she declined.  She subsequently presented on February 6 with  slurred speech and right arm weakness.  Her saturations were low by report and she was acidotic.  She was also noted to be in acute renal failure.  She has been admitted and cardiology asked to evaluate.  Note she denies chest pain, palpitations or syncope.  She has not had worsening pedal edema and denies increased dyspnea prior to her diarrhea. Chest x-ray shows cardiomegaly and vascular congestion.  Renal ultrasound showed no hydronephrosis. ABG shows pH 7.17, PCO2 60, PO2 113 and 97% saturated on BiPAP.  Sodium 135 with potassium 4.6, BUN greater than 130 and creatinine 4.90.  BNP greater than 4500, troponin 5916 and 5616, hemoglobin 12.8 and platelet count 98,000.  UA suggestive of UTI. Electrocardiogram shows normal sinus rhythm with occasional PVC, right axis deviation; unchanged compared to August 12, 2021.  1 question acute on chronic combined systolic/diastolic congestive heart failure-patient does not appear to be significantly volume overloaded on examination.  She also did not have CHF on her chest x-ray.  I think she is likely intravascularly dry.  Given acute renal failure would not diurese at this point.  I think she may have had a viral syndrome causing diarrhea, decreased p.o. intake which caused his presentation.  We will check CVP to assist with further  management of her diuretics.  Repeat echocardiogram.  2 acute on chronic stage IIIb kidney disease-patient is in acute renal failure likely from dehydration.  Would ask nephrology to evaluate.  Hold Lasix, Delene Loll, Jardiance.  3 history of nonischemic cardiomyopathy-as above given acute renal failure we will hold Entresto, Lasix, potassium and Jardiance.  Can resume in the future if renal function improves.  4 elevated troponin-in the setting of acute renal failure with no chest pain and no clear trend.  No plans for further ischemia evaluation.  5 history of paroxysmal atrial fibrillation-patient remains in sinus rhythm.  As outlined in HPI she does not tolerate atrial fibrillation (develops cardiogenic shock).  Follow on telemetry.  She is not on apixaban as she has had recurrent GI bleeding.  Kirk Ruths, MD

## 2021-12-31 NOTE — Progress Notes (Signed)
Heart Failure Navigator Progress Note  Assessed for Heart & Vascular TOC clinic readiness.  Patient does not meet criteria due to prior to hospitalization pt established with AHF clinic and [name].   Navigator available for educational resources.   Pricilla Holm, MSN, RN Heart Failure Nurse Navigator [phone]

## 2022-01-01 DIAGNOSIS — N179 Acute kidney failure, unspecified: Secondary | ICD-10-CM | POA: Diagnosis not present

## 2022-01-01 DIAGNOSIS — I5043 Acute on chronic combined systolic (congestive) and diastolic (congestive) heart failure: Secondary | ICD-10-CM | POA: Diagnosis not present

## 2022-01-01 DIAGNOSIS — J9601 Acute respiratory failure with hypoxia: Secondary | ICD-10-CM | POA: Diagnosis not present

## 2022-01-01 DIAGNOSIS — I509 Heart failure, unspecified: Secondary | ICD-10-CM | POA: Diagnosis not present

## 2022-01-01 DIAGNOSIS — I214 Non-ST elevation (NSTEMI) myocardial infarction: Secondary | ICD-10-CM | POA: Diagnosis not present

## 2022-01-01 LAB — GLUCOSE, CAPILLARY
Glucose-Capillary: 69 mg/dL — ABNORMAL LOW (ref 70–99)
Glucose-Capillary: 99 mg/dL (ref 70–99)
Glucose-Capillary: 69 mg/dL — ABNORMAL LOW (ref 70–99)
Glucose-Capillary: 104 mg/dL — ABNORMAL HIGH (ref 70–99)
Glucose-Capillary: 93 mg/dL (ref 70–99)
Glucose-Capillary: 119 mg/dL — ABNORMAL HIGH (ref 70–99)
Glucose-Capillary: 120 mg/dL — ABNORMAL HIGH (ref 70–99)

## 2022-01-01 LAB — CBC
HCT: 30.7 % — ABNORMAL LOW (ref 36.0–46.0)
Hemoglobin: 9.9 g/dL — ABNORMAL LOW (ref 12.0–15.0)
MCH: 28 pg (ref 26.0–34.0)
MCHC: 32.2 g/dL (ref 30.0–36.0)
MCV: 86.7 fL (ref 80.0–100.0)
Platelets: 59 10*3/uL — ABNORMAL LOW (ref 150–400)
RBC: 3.54 MIL/uL — ABNORMAL LOW (ref 3.87–5.11)
RDW: 14.6 % (ref 11.5–15.5)
WBC: 9 10*3/uL (ref 4.0–10.5)
nRBC: 2 % — ABNORMAL HIGH (ref 0.0–0.2)

## 2022-01-01 LAB — BASIC METABOLIC PANEL
Anion gap: 11 (ref 5–15)
Anion gap: 16 — ABNORMAL HIGH (ref 5–15)
BUN: 111 mg/dL — ABNORMAL HIGH (ref 8–23)
BUN: 133 mg/dL — ABNORMAL HIGH (ref 8–23)
CO2: 44 mmol/L — ABNORMAL HIGH (ref 22–32)
CO2: 26 mmol/L (ref 22–32)
Calcium: 5.9 mg/dL — CL (ref 8.9–10.3)
Calcium: 8.1 mg/dL — ABNORMAL LOW (ref 8.9–10.3)
Chloride: 81 mmol/L — ABNORMAL LOW (ref 98–111)
Chloride: 96 mmol/L — ABNORMAL LOW (ref 98–111)
Creatinine, Ser: 3.82 mg/dL — ABNORMAL HIGH (ref 0.44–1.00)
Creatinine, Ser: 5.11 mg/dL — ABNORMAL HIGH (ref 0.44–1.00)
GFR, Estimated: 12 mL/min — ABNORMAL LOW (ref >60)
GFR, Estimated: 9 mL/min — ABNORMAL LOW (ref >60)
Glucose, Bld: 67 mg/dL — ABNORMAL LOW (ref 70–99)
Glucose, Bld: 120 mg/dL — ABNORMAL HIGH (ref 70–99)
Potassium: 2.9 mmol/L — ABNORMAL LOW (ref 3.5–5.1)
Potassium: 4 mmol/L (ref 3.5–5.1)
Sodium: 136 mmol/L (ref 135–145)
Sodium: 138 mmol/L (ref 135–145)

## 2022-01-01 LAB — APTT: aPTT: 99 seconds — ABNORMAL HIGH (ref 24–36)

## 2022-01-01 LAB — PATHOLOGIST SMEAR REVIEW

## 2022-01-01 LAB — HEPARIN LEVEL (UNFRACTIONATED): Heparin Unfractionated: <0.1 IU/mL — ABNORMAL LOW (ref 0.30–0.70)

## 2022-01-01 LAB — MAGNESIUM: Magnesium: 2.1 mg/dL (ref 1.7–2.4)

## 2022-01-01 LAB — PHOSPHORUS: Phosphorus: 5 mg/dL — ABNORMAL HIGH (ref 2.5–4.6)

## 2022-01-01 MED ORDER — DEXTROSE 50 % IV SOLN
12.5000 g | INTRAVENOUS | Status: AC
  Administered 2022-01-01: 12.5 g via INTRAVENOUS

## 2022-01-01 MED ORDER — PANTOPRAZOLE SODIUM 40 MG PO TBEC
40.0000 mg | DELAYED_RELEASE_TABLET | Freq: Every day | ORAL | Status: DC
  Administered 2022-01-01 – 2022-01-18 (×18): 40 mg via ORAL
  Filled 2022-01-01 (×18): qty 1

## 2022-01-01 MED ORDER — POTASSIUM CHLORIDE CRYS ER 20 MEQ PO TBCR: 30.0000 meq | EXTENDED_RELEASE_TABLET | Freq: Once | ORAL | Status: DC

## 2022-01-01 MED ORDER — DEXTROSE 50 % IV SOLN
50.0000 mL | Freq: Once | INTRAVENOUS | Status: AC
  Administered 2022-01-01: 50 mL via INTRAVENOUS
  Filled 2022-01-01: qty 50

## 2022-01-01 MED ORDER — SODIUM CHLORIDE 0.9 % IV SOLN
1.0000 g | INTRAVENOUS | Status: AC
  Administered 2022-01-01 – 2022-01-07 (×7): 1 g via INTRAVENOUS
  Filled 2022-01-01 (×7): qty 10

## 2022-01-01 MED ORDER — CALCIUM GLUCONATE-NACL 2-0.675 GM/100ML-% IV SOLN
2.0000 g | INTRAVENOUS | Status: AC
Start: 2022-01-01 — End: 2022-01-01
  Administered 2022-01-01: 2000 mg via INTRAVENOUS
  Filled 2022-01-01: qty 100

## 2022-01-01 MED ORDER — SODIUM CHLORIDE 0.9 % IV SOLN: INTRAVENOUS | Status: DC

## 2022-01-01 MED ORDER — POTASSIUM CHLORIDE CRYS ER 20 MEQ PO TBCR
30.0000 meq | EXTENDED_RELEASE_TABLET | Freq: Once | ORAL | Status: AC
  Administered 2022-01-01: 30 meq via ORAL
  Filled 2022-01-01: qty 1

## 2022-01-01 MED ORDER — DEXTROSE 50 % IV SOLN
INTRAVENOUS | Status: AC
  Filled 2022-01-01: qty 50

## 2022-01-01 MED ORDER — SODIUM CHLORIDE 0.9 % IV SOLN: INTRAVENOUS | Status: DC | PRN

## 2022-01-01 MED ORDER — ALBUMIN HUMAN 25 % IV SOLN
25.0000 g | Freq: Once | INTRAVENOUS | Status: AC
  Administered 2022-01-01: 25 g via INTRAVENOUS
  Filled 2022-01-01: qty 100

## 2022-01-01 NOTE — Progress Notes (Signed)
Progress Note  Patient Name: Patricia Wagner Date of Encounter: 01/01/2022  [facility] Cardiologist: Glori Bickers, MD   Subjective   No CP or dyspnea  Inpatient Medications    Scheduled Meds:  chlorhexidine  15 mL Mouth Rinse BID   Chlorhexidine Gluconate Cloth  6 each Topical Daily   dextrose  50 mL Intravenous Once   mouth rinse  15 mL Mouth Rinse q12n4p   Continuous Infusions:  calcium gluconate     heparin 1,600 Units/hr (01/01/22 0800)    sodium bicarbonate (isotonic) infusion in sterile water 100 mL/hr at 01/01/22 0800   PRN Meds: acetaminophen **OR** acetaminophen   Vital Signs    Vitals:   01/01/22 0745 01/01/22 0752 01/01/22 0753 01/01/22 0754  BP: 124/76     Pulse: (!) 58  80 66  Resp: 16  (!) 24 (!) 26  Temp:  (!) 97.4 F (36.3 C)    TempSrc:  Oral    SpO2: 96%  97% 90%  Weight:      Height:        Intake/Output Summary (Last 24 hours) at 01/01/2022 0813 Last data filed at 01/01/2022 0800 Gross per 24 hour  Intake 2309.15 ml  Output 275 ml  Net 2034.15 ml   Last 3 Weights 12/30/2021 10/15/2021 09/30/2021  Weight (lbs) 218 lb 0.6 oz 218 lb 215 lb 6.4 oz  Weight (kg) 98.9 kg 98.884 kg 97.705 kg      Telemetry    Sinus with frequent PVCs, nonsustained ventricular tachycardia and occasional junctional beats- Personally Reviewed  Physical Exam   GEN: No acute distress.   Neck: No JVD Cardiac: irregular Respiratory: Clear to auscultation bilaterally. GI: Soft, nontender, non-distended  MS: No edema Neuro:  Nonfocal  Psych: Normal affect   Labs    High Sensitivity Troponin:   Recent Labs  Lab 12/30/21 2342 12/31/21 0215  TROPONINIHS 5,916* 5,616*     Chemistry Recent Labs  Lab 12/30/21 2342 12/30/21 2348 12/31/21 0005 12/31/21 0827 12/31/21 1158 12/31/21 1610 01/01/22 0643  NA 136   < > 135   < > 138 137 136  K 4.5   < > 4.4   < > 4.3 4.6 2.9*  CL 104  --  104  --   --  102 81*  CO2 16*  --   --   --   --  21* 44*   GLUCOSE 97  --  92  --   --  76 67*  BUN 128*  --  >130*  --   --  130* 111*  CREATININE 4.95*  --  4.90*  --   --  5.03* 3.82*  CALCIUM 8.6*  --   --   --   --  8.2* 5.9*  MG  --   --   --   --   --  2.8* 2.1  PROT 6.0*  --   --   --   --   --   --   ALBUMIN 3.0*  --   --   --   --   --   --   AST 41  --   --   --   --   --   --   ALT 28  --   --   --   --   --   --   ALKPHOS 151*  --   --   --   --   --   --  BILITOT 1.3*  --   --   --   --   --   --   GFRNONAA 9*  --   --   --   --  9* 12*  ANIONGAP 16*  --   --   --   --  14 11   < > = values in this interval not displayed.    Hematology Recent Labs  Lab 12/30/21 2342 12/30/21 2348 12/31/21 0827 12/31/21 1158 01/01/22 0643  WBC 12.9*  --   --   --  9.0  RBC 4.50  --   --   --  3.54*  HGB 12.8   < > 13.3 13.6 9.9*  HCT 41.9   < > 39.0 40.0 30.7*  MCV 93.1  --   --   --  86.7  MCH 28.4  --   --   --  28.0  MCHC 30.5  --   --   --  32.2  RDW 15.2  --   --   --  14.6  PLT 98*  --   --   --  59*   < > = values in this interval not displayed.    BNP Recent Labs  Lab 12/30/21 2342  BNP >4,500.0*     Radiology    DG Chest 2 View  Result Date: 12/30/2021 CLINICAL DATA:  Altered mental status, slurred speech EXAM: CHEST - 2 VIEW COMPARISON:  08/28/2021 FINDINGS: Cardiomegaly. Vascular congestion. Aortic atherosclerosis. No confluent opacity, effusion or edema. No acute bony abnormality. IMPRESSION: Cardiomegaly, vascular congestion. Electronically Signed   By: Rolm Baptise M.D.   On: 12/30/2021 23:25   CT HEAD WO CONTRAST  Result Date: 12/30/2021 CLINICAL DATA:  Neuro deficit, acute, stroke suspected dysarthria, weakness EXAM: CT HEAD WITHOUT CONTRAST TECHNIQUE: Contiguous axial images were obtained from the base of the skull through the vertex without intravenous contrast. RADIATION DOSE REDUCTION: This exam was performed according to the departmental dose-optimization program which includes automated exposure control,  adjustment of the mA and/or kV according to patient size and/or use of iterative reconstruction technique. COMPARISON:  08/05/2018 FINDINGS: Brain: Old left cerebellar infarct. No acute intracranial abnormality. Specifically, no hemorrhage, hydrocephalus, mass lesion, acute infarction, or significant intracranial injury. Vascular: No hyperdense vessel or unexpected calcification. Skull: No acute calvarial abnormality. Sinuses/Orbits: No acute findings Other: None IMPRESSION: Old left cerebellar infarct. No acute intracranial abnormality. Electronically Signed   By: Rolm Baptise M.D.   On: 12/30/2021 23:26   US RENAL  Result Date: 12/31/2021 CLINICAL DATA:  71 year old female with history of acute kidney injury. EXAM: RENAL / URINARY TRACT ULTRASOUND COMPLETE COMPARISON:  Abdominal ultrasound 08/06/2018. FINDINGS: Right Kidney: Renal measurements: 9.5 x 5.8 x 6.1 cm = volume: 173.2 mL. Echogenicity within normal limits. No mass or hydronephrosis visualized. Left Kidney: Renal measurements: 10.2 x 6.0 x 5.7 cm = volume: 181.8 mL. Echogenicity within normal limits. In the lower pole of the left kidney there is a well-circumscribed 3.0 x 4.0 x 3.1 cm anechoic lesion with increased through transmission, compatible with a simple cyst. No solid renal mass noted. No hydronephrosis visualized. Bladder: Could not be visualized secondary to the patient's large body habitus. Other: None. IMPRESSION: 1. No acute findings.  Specifically, no hydronephrosis. 2. Simple cyst in the lower pole of the left kidney again noted. 3. Limited study which was unable to visualize the urinary bladder. Electronically Signed   By: Vinnie Langton M.D.   On: 12/31/2021 07:27  ECHOCARDIOGRAM COMPLETE  Result Date: 12/31/2021    ECHOCARDIOGRAM REPORT   Patient Name:   Patricia Wagner Date of Exam: 12/31/2021 Medical Rec #:  [id number]    Height:       64.0 in Accession #:    [id number]   Weight:       218.0 lb Date of Birth:  02-Jul-1951    BSA:           2.029 m Patient Age:    71 years     BP:           99/62 mmHg Patient Gender: F            HR:           70 bpm. Exam Location:  Inpatient Procedure: 2D Echo Indications:    Acute systolic CHF  History:        Patient has prior history of Echocardiogram examinations, most                 recent 08/12/2021. CHF, CAD, Arrythmias:Atrial Fibrillation and                 Bradycardia; Risk Factors:Hypertension.  Sonographer:    Arlyss Gandy Referring Phys: [id number] RATHORE  Sonographer Comments: Image acquisition challenging due to patient body habitus and supine. IMPRESSIONS  1. Left ventricular ejection fraction, by estimation, is 45 to 50%. The left ventricle has mildly decreased function. The left ventricle demonstrates global hypokinesis. Left ventricular diastolic function could not be evaluated.  2. Right ventricular systolic function is moderately reduced. The right ventricular size is severely enlarged. There is moderately elevated pulmonary artery systolic pressure.  3. Left atrial size was mildly dilated.  4. Right atrial size was severely dilated.  5. The mitral valve is normal in structure. No evidence of mitral valve regurgitation.  6. Tricuspid valve regurgitation is moderate to severe.  7. The aortic valve is tricuspid. There is mild calcification of the aortic valve. There is mild thickening of the aortic valve. Aortic valve regurgitation is mild. Aortic valve sclerosis/calcification is present, without any evidence of aortic stenosis. Comparison(s): A prior study was performed on 08/12/2021. The left ventricular function has improved. The left ventricle is also less dilated. The improvement is noted despite the fact that the previous study showed sinus rhythm and now the patient is in  atrial fibrillation. Conclusion(s)/Recommendation(s): The background rhythm is atrial fibrillation with occasional ventricular-paced beats. During ventricular pacing there is marked dyssynchrony, lower LVEF  and the tricuspid insufficiency appears worse. FINDINGS  Left Ventricle: Left ventricular ejection fraction, by estimation, is 45 to 50%. The left ventricle has mildly decreased function. The left ventricle demonstrates global hypokinesis. The left ventricular internal cavity size was normal in size. There is  no left ventricular hypertrophy. Left ventricular diastolic function could not be evaluated due to atrial fibrillation. Left ventricular diastolic function could not be evaluated. Right Ventricle: The right ventricular size is severely enlarged. No increase in right ventricular wall thickness. Right ventricular systolic function is moderately reduced. There is moderately elevated pulmonary artery systolic pressure. The tricuspid regurgitant velocity is 3.23 m/s, and with an assumed right atrial pressure of 15 mmHg, the estimated right ventricular systolic pressure is 83.6 mmHg. Left Atrium: Left atrial size was mildly dilated. Right Atrium: Right atrial size was severely dilated. Pericardium: There is no evidence of pericardial effusion. Mitral Valve: The mitral valve is normal in structure. Mild mitral annular calcification. No evidence of mitral valve regurgitation.  Tricuspid Valve: The tricuspid valve is normal in structure. Tricuspid valve regurgitation is moderate to severe. Aortic Valve: The aortic valve is tricuspid. There is mild calcification of the aortic valve. There is mild thickening of the aortic valve. Aortic valve regurgitation is mild. Aortic valve sclerosis/calcification is present, without any evidence of aortic stenosis. Aortic valve mean gradient measures 10.5 mmHg. Aortic valve peak gradient measures 18.4 mmHg. Aortic valve area, by VTI measures 1.44 cm. Pulmonic Valve: The pulmonic valve was grossly normal. Pulmonic valve regurgitation is not visualized. Aorta: The aortic root is normal in size and structure. IAS/Shunts: No atrial level shunt detected by color flow Doppler.  LEFT  VENTRICLE PLAX 2D LVIDd:         5.30 cm   Diastology LVIDs:         3.80 cm   LV e' lateral:   8.05 cm/s LV PW:         1.10 cm   LV E/e' lateral: 10.5 LV IVS:        1.10 cm LVOT diam:     2.20 cm LV SV:         50 LV SV Index:   25 LVOT Area:     3.80 cm  RIGHT VENTRICLE            IVC RV Basal diam:  5.50 cm    IVC diam: 2.70 cm RV Mid diam:    4.90 cm RV S prime:     9.90 cm/s TAPSE (M-mode): 1.5 cm LEFT ATRIUM             Index        RIGHT ATRIUM           Index LA diam:        4.30 cm 2.12 cm/m   RA Area:     40.70 cm LA Vol (A2C):   63.6 ml 31.34 ml/m  RA Volume:   167.00 ml 82.29 ml/m LA Vol (A4C):   65.9 ml 32.47 ml/m LA Biplane Vol: 65.0 ml 32.03 ml/m  AORTIC VALVE AV Area (Vmax):    1.64 cm AV Area (Vmean):   1.44 cm AV Area (VTI):     1.44 cm AV Vmax:           214.50 cm/s AV Vmean:          152.000 cm/s AV VTI:            0.346 m AV Peak Grad:      18.4 mmHg AV Mean Grad:      10.5 mmHg LVOT Vmax:         92.70 cm/s LVOT Vmean:        57.600 cm/s LVOT VTI:          0.131 m LVOT/AV VTI ratio: 0.38  AORTA Ao Root diam: 3.00 cm MITRAL VALVE               TRICUSPID VALVE MV Area (PHT): 2.66 cm    TR Peak grad:   41.7 mmHg MV Decel Time: 285 msec    TR Vmax:        323.00 cm/s MV E velocity: 84.80 cm/s                            SHUNTS  Systemic VTI:  0.13 m                            Systemic Diam: 2.20 cm Mihai Croitoru MD Electronically signed by Sanda Klein MD Signature Date/Time: 12/31/2021/3:32:07 PM    Final     Patient Profile     71 year old female with past medical history of atrial fibrillation status post ablation (note patient developed cardiogenic shock with atrial fibrillation in the past), nonischemic cardiomyopathy, history of recurrent diverticular GI bleeding and therefore not anticoagulated, PVCs, chronic stage IIIb kidney disease, chronic systolic congestive heart failure, rheumatoid arthritis, Raynaud's, prior CVA, COPD, hypertension,  hyperlipidemia, urinary retention for evaluation of question acute on chronic combined systolic/diastolic congestive heart failure.  Cardiac catheterization November 2017 showed mild nonobstructive coronary disease. Patient apparently at baseline until approximately 5 to 7 days ago when she developed diarrhea and decreased appetite.  She apparently had a near syncopal episode and was also noted to be hypoxic at primary care office and EMS transfer to the ED was recommended but she declined.  She subsequently presented on February 6 with slurred speech and right arm weakness.  Her saturations were low by report and she was acidotic.  She was also noted to be in acute renal failure.  She has been admitted and cardiology asked to evaluate.  Echocardiogram repeated this admission shows ejection fraction 45 to 50%, severe right ventricular enlargement, moderate RV dysfunction, moderate pulmonary hypertension, mild left atrial enlargement, severe right atrial enlargement, moderate to severe tricuspid regurgitation and mild aortic insufficiency.  Assessment & Plan    1 chronic combined systolic/diastolic congestive heart failure-patient does not appear to be significantly volume overloaded on examination and was likely dry at time of admission from recent diarrhea.  She has been gently hydrated given acute renal failure.  Diuretics are on hold.  Follow exam closely.  Note follow-up echocardiogram showed some improvement in LV function compared to previous but she does have RV dysfunction.     2 acute on chronic stage IIIb kidney disease-renal function with some improvement with hydration.  Continue to hold Lasix, Entresto and Jardiance.  Nephrology following.   3 history of nonischemic cardiomyopathy-Entresto, Jardiance, Lasix and potassium on hold given acute renal failure.  We will follow and resume later if renal function/blood pressure allow.   4 elevated troponin-in the setting of acute renal failure with no  chest pain and no clear trend.  No plans for further ischemia evaluation.   5 history of paroxysmal atrial fibrillation-patient remains in sinus rhythm on telemetry with occasional junctional beats.  As outlined in HPI she does not tolerate atrial fibrillation (develops cardiogenic shock).  Follow on telemetry.  She is not on apixaban as she has had recurrent GI bleeding.  6 occasional junctional rhythm-patient appears to have junctional beats at times but no prolonged pauses.  We will continue to follow.  For questions or updates, please contact [place] Please consult [email or web address] for contact info under        Signed, Kirk Ruths, MD  01/01/2022, 8:13 AM

## 2022-01-01 NOTE — Evaluation (Signed)
Clinical/Bedside Swallow Evaluation Patient Details  Name: Patricia Wagner MRN: [id number] Date of Birth: [birth year]  Today's Date: 01/01/2022 Time: SLP Start Time (ACUTE ONLY): 52 SLP Stop Time (ACUTE ONLY): 1535 SLP Time Calculation (min) (ACUTE ONLY): 20 min  Past Medical History:  Past Medical History:  Diagnosis Date   Acute blood loss anemia 03/05/2018   Acute hypercapnic respiratory failure ([place]) 04/30/2017   Acute renal failure superimposed on chronic kidney disease ([place]) 10/20/2016   Arthritis    hands, knees   Atrial fibrillation ([place])    a. s/p multiple cardioversions; failed tikosyn/sotalol.   Bradycardia 11/20/2016   CAD in native artery, s/p cardiac cath with non obstructive CAD 10/24/2016   Cerebrovascular accident (CVA) due to embolism of left cerebellar artery (HCC)    CHF (congestive heart failure) ([place])    Chronic anticoagulation 03/15/2018   Chronic atrial fibrillation ([place]) 6/50/3546   Chronic systolic (congestive) heart failure ([place]) 03/05/2018   CKD (chronic kidney disease) stage 3, GFR 30-59 ml/min (HCC)    Coagulopathy ([place]) 03/05/2018   DIVERTICULITIS, HX OF 07/25/2007   Diverticulosis of colon with hemorrhage 07/22/2007   Qualifier: Diagnosis of  By: [name]     DIVERTICULOSIS, COLON 07/22/2007   Dysuria 07/21/2017   Edema, peripheral    a. chronic BLE edema, R>L. Prior trauma from dog attack and accident.   Essential hypertension 07/22/2007   Qualifier: Diagnosis of  By: [name]     History of kidney stones    HLD (hyperlipidemia) 02/03/2008   Qualifier: Diagnosis of  By: [name], James W    HYPERLIPIDEMIA 02/03/2008   Hypersomnia    declines w/u   Hypertension    Hypotension (arterial) 04/30/2017   MENOPAUSAL DISORDER 01/09/2011   Morbid obesity ([place]) 07/22/2007   NICM (nonischemic cardiomyopathy) ([place]) 10/24/2016   PAF (paroxysmal atrial fibrillation) ([place])    Presence of indwelling urinary catheter    supra pubic cath   Raynaud's syndrome  07/22/2007   Stroke ([place]) 2017   Suspected sleep apnea 05/20/2017   THYROID NODULE, RIGHT 01/04/2010   VITAMIN D DEFICIENCY 01/09/2011   Qualifier: Diagnosis of  By: [name], [name]    Past Surgical History:  Past Surgical History:  Procedure Laterality Date   ATRIAL FIBRILLATION ABLATION N/A 10/13/2019   Procedure: ATRIAL FIBRILLATION ABLATION;  Surgeon: [name], MD;  Location: [place] [facility];  Service: Cardiovascular;  Laterality: N/A;   CARDIAC CATHETERIZATION N/A 10/23/2016   Procedure: Left Heart Cath and Coronary Angiography;  Surgeon: [name], MD;  Location: [place] CV LAB;  Service: Cardiovascular;  Laterality: N/A;   CARDIOVERSION N/A 10/31/2016   Procedure: CARDIOVERSION;  Surgeon: [name], MD;  Location: [place];  Service: Cardiovascular;  Laterality: N/A;   CARDIOVERSION N/A 11/03/2016   Procedure: CARDIOVERSION;  Surgeon: [name], MD;  Location: [place];  Service: Cardiovascular;  Laterality: N/A;   CARDIOVERSION N/A 11/18/2016   Procedure: CARDIOVERSION;  Surgeon: [name], MD;  Location: [facility] ENDOSCOPY;  Service: Cardiovascular;  Laterality: N/A;   CARDIOVERSION N/A 03/25/2017   Procedure: CARDIOVERSION;  Surgeon: [name], MD;  Location: [facility] ENDOSCOPY;  Service: Cardiovascular;  Laterality: N/A;   CARDIOVERSION N/A 05/06/2017   Procedure: CARDIOVERSION;  Surgeon: [name], MD;  Location: [facility] ENDOSCOPY;  Service: Cardiovascular;  Laterality: N/A;   CARDIOVERSION N/A 05/12/2017   Procedure: CARDIOVERSION;  Surgeon: [name], MD;  Location: [place];  Service: Cardiovascular;  Laterality: N/A;  CHOLECYSTECTOMY     COLONOSCOPY N/A 12/04/2017   Procedure: COLONOSCOPY;  Surgeon: [name], MD;  Location: [facility] ENDOSCOPY;  Service: Endoscopy;  Laterality: N/A;   COLONOSCOPY     COLONOSCOPY W/ POLYPECTOMY  02/2011   pan diverticulosis.  tubular adenoma without dysplasia on 5 mm sigmoid  polyp.  Dr Fuller Plan.     ESOPHAGOGASTRODUODENOSCOPY (EGD) WITH PROPOFOL N/A 05/16/2020   Procedure: ESOPHAGOGASTRODUODENOSCOPY (EGD) WITH PROPOFOL;  Surgeon: Milus Banister, MD;  Location: [facility] ENDOSCOPY;  Service: Endoscopy;  Laterality: N/A;   FLEXIBLE SIGMOIDOSCOPY N/A 01/18/2020   Procedure: FLEXIBLE SIGMOIDOSCOPY;  Surgeon: [name], DO;  Location: [place];  Service: Gastroenterology;  Laterality: N/A;   HEMOSTASIS CLIP PLACEMENT  01/18/2020   Procedure: HEMOSTASIS CLIP PLACEMENT;  Surgeon: [name], DO;  Location: [place];  Service: Gastroenterology;;   HOT HEMOSTASIS N/A 05/16/2020   Procedure: HOT HEMOSTASIS (ARGON PLASMA COAGULATION/BICAP);  Surgeon: Milus Banister, MD;  Location: [facility];  Service: Endoscopy;  Laterality: N/A;   PARTIAL HYSTERECTOMY     1 OVARY LEFT   SCLEROTHERAPY  01/18/2020   Procedure: SCLEROTHERAPY;  Surgeon: [name], DO;  Location: [place] ENDOSCOPY;  Service: Gastroenterology;;   TEE WITHOUT CARDIOVERSION N/A 10/31/2016   Procedure: TRANSESOPHAGEAL ECHOCARDIOGRAM (TEE);  Surgeon: [name], MD;  Location: [facility] ENDOSCOPY;  Service: Cardiovascular;  Laterality: N/A;   TEE WITHOUT CARDIOVERSION N/A 03/25/2017   Procedure: TRANSESOPHAGEAL ECHOCARDIOGRAM (TEE);  Surgeon: [name], MD;  Location: [facility] ENDOSCOPY;  Service: Cardiovascular;  Laterality: N/A;   TOTAL KNEE ARTHROPLASTY Right 04/10/2020   Procedure: TOTAL KNEE ARTHROPLASTY;  Surgeon: Renette Butters, MD;  Location: [facility] ORS;  Service: Orthopedics;  Laterality: Right;   HPI:  Pt is a 71 y.o. female admitted 12/30/21 with SOB, AMS, slurred speech. Initially admitted as code stroke; head CT negative for acute finding; neurology suspects slurred speech related to hypoxia given abscence of other focal neurodeficits. Workup for metabolic encephalopathy, AKI on CKD, severe metabolic/respiratory acidosis requiring bicarbonate infusion and BiPAP, probable UTI. PMH  includes CKD, CHF, RA, COPD, CVA, CAD, HTN, HLD, Raynaud's disease. Per RN, patient had a lot of coughing at lunch today after drinking some iced tea, prompting this bedside swallow evaluation.    Assessment / Plan / Recommendation  Clinical Impression  Patient did not present with clinical s/s of dysphagia as per this bedside swallow evaluation. She consumed plain, thin water via consecutive large straw sips without overt s/s aspiration or penetration and with swallow initiation appearing timely. Patient able to masticate graham cracker without difficulty. SLP is not recommending further skilled intervention for swallowing at this time, however patient may benefit from SLE secondary to h/o CVA with residual aphasia. SLP Visit Diagnosis: Dysphagia, unspecified (R13.10)    Aspiration Risk  Mild aspiration risk;No limitations    Diet Recommendation Regular;Thin liquid   Liquid Administration via: Cup;Straw Medication Administration: Whole meds with liquid Supervision: Patient able to self feed;Intermittent supervision to cue for compensatory strategies Compensations: Slow rate;Small sips/bites Postural Changes: Seated upright at 90 degrees    Other  Recommendations Oral Care Recommendations: Oral care BID    Recommendations for follow up therapy are one component of a multi-disciplinary discharge planning process, led by the attending physician.  Recommendations may be updated based on patient status, additional functional criteria and insurance authorization.  Follow up Recommendations No SLP follow up      Assistance Recommended at Discharge None  Functional Status Assessment Patient has had  a recent decline in their functional status and demonstrates the ability to make significant improvements in function in a reasonable and predictable amount of time.  Frequency and Duration            Prognosis   N/A     Swallow Study   General Date of Onset: 01/01/22 HPI: Pt is a 71 y.o.  female admitted 12/30/21 with SOB, AMS, slurred speech. Initially admitted as code stroke; head CT negative for acute finding; neurology suspects slurred speech related to hypoxia given abscence of other focal neurodeficits. Workup for metabolic encephalopathy, AKI on CKD, severe metabolic/respiratory acidosis requiring bicarbonate infusion and BiPAP, probable UTI. PMH includes CKD, CHF, RA, COPD, CVA, CAD, HTN, HLD, Raynaud's disease. Per RN, patient had a lot of coughing at lunch today after drinking some iced tea, prompting this bedside swallow evaluation. Type of Study: Bedside Swallow Evaluation Previous Swallow Assessment: during previous visit Diet Prior to this Study: Regular;Thin liquids Temperature Spikes Noted: No Respiratory Status: Nasal cannula History of Recent Intubation: No Behavior/Cognition: Alert;Pleasant mood;Cooperative Oral Cavity Assessment: Within Functional Limits Oral Care Completed by SLP: No Oral Cavity - Dentition: Adequate natural dentition Vision: Functional for self-feeding Self-Feeding Abilities: Needs set up;Able to feed self Patient Positioning: Upright in bed Baseline Vocal Quality: Normal Volitional Cough: Cognitively unable to elicit Volitional Swallow: Able to elicit    Oral/Motor/Sensory Function Overall Oral Motor/Sensory Function: Within functional limits   Ice Chips     Thin Liquid Thin Liquid: Within functional limits Presentation: Straw    Nectar Thick     Honey Thick     Puree Puree: Not tested   Solid     Solid: Within functional limits Presentation: [place], MA, CCC-SLP Speech Therapy

## 2022-01-01 NOTE — Progress Notes (Signed)
[name] Progress Note Patient Name: Patricia Wagner DOB: 1951/01/18 MRN: [id number]   Date of Service  01/01/2022  HPI/Events of Note  Patient is having frequent watery stools.  eICU Interventions  Flexiseal ordered.        Kerry Kass Adele Milson 01/01/2022, 5:45 AM

## 2022-01-01 NOTE — Evaluation (Signed)
Physical Therapy Evaluation Patient Details Name: Patricia Wagner MRN: [id number] DOB: 12/10/1950 Today's Date: 01/01/2022  History of Present Illness  Pt is a 71 y.o. female admitted 12/30/21 with SOB, AMS, slurred speech. Initially admitted as code stroke; head CT negative for acute finding; neurology suspects slurred speech related to hypoxia given abscence of other focal neurodeficits. Workup for metabolic encephalopathy, AKI on CKD, severe metabolic/respiratory acidosis requiring bicarbonate infusion and BiPAP, probable UTI. PMH includes CKD, CHF, RA, COPD, CVA, CAD, HTN, HLD, Raynaud's disease.   Clinical Impression  Pt presents with an overall decrease in functional mobility secondary to above. PTA, pt lives alone, independent and driving. Today, pt requiring up to [facility] for bed mobility; pt taking significant increased time to mobilize, adamant against external assist or cues for sequencing, therefore difficult to determine pt's true physical capability this session. Pt limited by generalized weakness, decreased activity tolerance and impaired cognition; suspect she will require post-acute rehab to maximize functional mobility and independence prior to return home. Will follow acutely to address established goals.   Recommendations for follow up therapy are one component of a multi-disciplinary discharge planning process, led by the attending physician.  Recommendations may be updated based on patient status, additional functional criteria and insurance authorization.  Follow Up Recommendations Skilled nursing-short term rehab (<3 hours/day)    Assistance Recommended at Discharge Frequent or constant Supervision/Assistance  Patient can return home with the following  A lot of help with walking and/or transfers;A lot of help with bathing/dressing/bathroom;Assistance with cooking/housework;Assist for transportation;Help with stairs or ramp for entrance (TBD)    Equipment Recommendations  (TBD)   Recommendations for Other Services    Occupational Therapy   Functional Status Assessment Patient has had a recent decline in their functional status and demonstrates the ability to make significant improvements in function in a reasonable and predictable amount of time.     Precautions / Restrictions Precautions Precautions: Fall;Other (comment) Precaution Comments: flexiseal; h/o expressive aphasia (per SLP); chronic foley cath Restrictions Weight Bearing Restrictions: No      Mobility  Bed Mobility Overal bed mobility: Needs Assistance Bed Mobility: Supine to Sit, Sit to Supine     Supine to sit: Mod assist, Supervision Sit to supine: Mod assist   General bed mobility comments: significant increased time and effort to come to sitting EOB, heavy use of bed rail, initial min-modA to scoot hips which pt did not appreciate, adamant against receiving further assist; modA for LE management return to supine    Transfers                   General transfer comment: prolonged sitting EOB stating she wants to stand and get to chair, but ultimately not initiating task despite cues/education/encouragement; pt states she will not be able to do it alone, but then declines assist from therapist; ultimately returning to supine    Ambulation/Gait                  Stairs            Wheelchair Mobility    Modified Rankin (Stroke Patients Only)       Balance Overall balance assessment: Needs assistance Sitting-balance support: No upper extremity supported, Feet supported, Feet unsupported Sitting balance-Leahy Scale: Fair         Standing balance comment: NT  Pertinent Vitals/Pain Pain Assessment Pain Assessment: Faces Faces Pain Scale: Hurts even more Pain Location: buttocks/flexiseal Pain Descriptors / Indicators: Grimacing, Guarding, Moaning Pain Intervention(s): Monitored during session, Repositioned    Home  Living Family/patient expects to be discharged to:: Private residence Living Arrangements: Alone Available Help at Discharge: Family;Friend(s);Available PRN/intermittently Type of Home: Apartment Home Access: Level entry       Home Layout: One level   Additional Comments: pt not forthcoming with details and seems frustrated by questions - therefore, PLOF info limited    Prior Function Prior Level of Function : Independent/Modified Independent;Driving;Patient poor historian/Family not available (unsure reliable historian)             Mobility Comments: Reports independent without DME; enjoys driving her car ADLs Comments: Reports having chronic foley catheter which she is indep managing     Hand Dominance        Extremity/Trunk Assessment   Upper Extremity Assessment Upper Extremity Assessment: Generalized weakness    Lower Extremity Assessment Lower Extremity Assessment: Generalized weakness    Cervical / Trunk Assessment Cervical / Trunk Assessment: Kyphotic  Communication   Communication: Expressive difficulties  Cognition Arousal/Alertness: Awake/alert Behavior During Therapy: Flat affect Overall Cognitive Status: No family/caregiver present to determine baseline cognitive functioning                                 General Comments: requires significant increased time to complete task, does not want to be physically assisted, easily distracted but adamant against PT helping with mobility; "I'm trying... If you'd been laying in this bed as long as I've been laying in this bed, you'd understand"        General Comments General comments (skin integrity, edema, etc.): initiated discussion regarding d/c planning at end of session, but pt did not engage in this; encouraged pt to start thinking about family/friends who can assist at home if needed    Exercises     Assessment/Plan    PT Assessment Patient needs continued PT services  PT Problem  List Decreased strength;Decreased activity tolerance;Decreased balance;Decreased mobility;Decreased cognition;Decreased knowledge of use of DME;Pain       PT Treatment Interventions DME instruction;Gait training;Functional mobility training;Therapeutic activities;Therapeutic exercise;Balance training;Patient/family education    PT Goals (Current goals can be found in the Care Plan section)  Acute Rehab PT Goals Patient Stated Goal: Get back to driving my car PT Goal Formulation: With patient Time For Goal Achievement: 01/15/22 Potential to Achieve Goals: Fair    Frequency Min 3X/week     Co-evaluation               AM-PAC PT "6 Clicks" Mobility  Outcome Measure Help needed turning from your back to your side while in a flat bed without using bedrails?: A Little Help needed moving from lying on your back to sitting on the side of a flat bed without using bedrails?: A Lot Help needed moving to and from a bed to a chair (including a wheelchair)?: A Lot Help needed standing up from a chair using your arms (e.g., wheelchair or bedside chair)?: A Lot Help needed to walk in hospital room?: Total Help needed climbing 3-5 steps with a railing? : Total 6 Click Score: 11    End of Session   Activity Tolerance: Patient limited by fatigue Patient left: in bed;with call bell/phone within reach;with bed alarm set Nurse Communication: Mobility status PT Visit Diagnosis: Other abnormalities  of gait and mobility (R26.89);Muscle weakness (generalized) (M62.81)    Time: 9833-8250 PT Time Calculation (min) (ACUTE ONLY): 48 min   Charges:   PT Evaluation $PT Eval Moderate Complexity: 1 Mod PT Treatments $Therapeutic Activity: 8-22 mins      Patricia Wagner, PT, DPT Acute Rehabilitation Services  Pager [phone] Office [phone]  Patricia Wagner 01/01/2022, 5:26 PM

## 2022-01-01 NOTE — Progress Notes (Signed)
Hypoglycemic Event  CBG: 69  Treatment: D50 50 mL (25 gm)  Symptoms: None  Follow-up CBG: Time:0810 CBG Result:104  Possible Reasons for Event: Inadequate meal intake  Comments/MD notified:Yes    Petrita Blunck E

## 2022-01-01 NOTE — Progress Notes (Signed)
NAME:  Patricia Wagner, MRN:  [id number], DOB:  11/30/1950, LOS: 1 ADMISSION DATE:  12/30/2021, CONSULTATION DATE:  12/29/2021 REFERRING MD:  [facility] CHIEF COMPLAINT:  Acute hypoxic/hypercapnic respiratory failure  History of Present Illness:  71 year old female who presents with increasing respiratory distress and has a well-documented past medical history of atrial fibrillation, chronic kidney disease, congestive heart failure, rheumatoid arthritis, Raynaud's disease, cardiovascular accident, COPD, urinary retention hypertension hyperlipidemia with known congestive heart failure.  She was trialed on noninvasive mechanical ventilatory support with no improvement of her pH of 7.1 therefore pulmonary critical care was called to the bedside.  She was started on a bicarbonate drip she will be transferred to the intensive care unit and has a low threshold for being intubated.  She may need a central line and/or hemodialysis catheter in the near future.  Pertinent  Medical History   Past Medical History:  Diagnosis Date   Acute blood loss anemia 03/05/2018   Acute hypercapnic respiratory failure ([place]) 04/30/2017   Acute renal failure superimposed on chronic kidney disease ([place]) 10/20/2016   Arthritis    hands, knees   Atrial fibrillation ([place])    a. s/p multiple cardioversions; failed tikosyn/sotalol.   Bradycardia 11/20/2016   CAD in native artery, s/p cardiac cath with non obstructive CAD 10/24/2016   Cerebrovascular accident (CVA) due to embolism of left cerebellar artery (HCC)    CHF (congestive heart failure) ([place])    Chronic anticoagulation 03/15/2018   Chronic atrial fibrillation ([place]) 0/24/0973   Chronic systolic (congestive) heart failure ([place]) 03/05/2018   CKD (chronic kidney disease) stage 3, GFR 30-59 ml/min (HCC)    Coagulopathy ([place]) 03/05/2018   DIVERTICULITIS, HX OF 07/25/2007   Diverticulosis of colon with hemorrhage 07/22/2007   Qualifier: Diagnosis of  By: [name]      DIVERTICULOSIS, COLON 07/22/2007   Dysuria 07/21/2017   Edema, peripheral    a. chronic BLE edema, R>L. Prior trauma from dog attack and accident.   Essential hypertension 07/22/2007   Qualifier: Diagnosis of  By: [name]     History of kidney stones    HLD (hyperlipidemia) 02/03/2008   Qualifier: Diagnosis of  By: [name], Omya Winfield W    HYPERLIPIDEMIA 02/03/2008   Hypersomnia    declines w/u   Hypertension    Hypotension (arterial) 04/30/2017   MENOPAUSAL DISORDER 01/09/2011   Morbid obesity ([place]) 07/22/2007   NICM (nonischemic cardiomyopathy) ([place]) 10/24/2016   PAF (paroxysmal atrial fibrillation) ([place])    Presence of indwelling urinary catheter    supra pubic cath   Raynaud's syndrome 07/22/2007   Stroke ([place]) 2017   Suspected sleep apnea 05/20/2017   THYROID NODULE, RIGHT 01/04/2010   VITAMIN D DEFICIENCY 01/09/2011   Qualifier: Diagnosis of  By: [name], [facility] Events: Including procedures, antibiotic start and stop dates in addition to other pertinent events   2/7 Admit to  ICU on BiPAP , Bicarbonate drip   Interim History / Subjective:  Continues to have loose bowel movements. Fecal management system   Patient talking in completes sentences. Does not like the FMS. Discussed plan of care today. No further questions or concerns.   Objective   Blood pressure 124/76, pulse 66, temperature (!) 97.4 F (36.3 C), temperature source Oral, resp. rate (!) 26, height 5\' 4"  (1.626 m), weight 98.9 kg, SpO2 90 %.    FiO2 (%):  [35 %] 35 %   Intake/Output Summary (Last 24 hours)  at 01/01/2022 0957 Last data filed at 01/01/2022 0800 Gross per 24 hour  Intake 2309.15 ml  Output 275 ml  Net 2034.15 ml   Filed Weights   12/30/21 2229  Weight: 98.9 kg    Examination: General:  In bed on  [place], NAD HENT: NCAT , dry oral mucosa  PULM: CTAB, no wheezing , no rales CV: RRR, no mgr , LE 2 + pitting edema  GI: +BS, soft, nontender Neuro: Alert and  oriented   Hgb 13.6 > 9.9, MCV 86.7 HCT 40 > 30.7 Platelets 98 > 59   K 2.9 CO2 44 BUN 111 Cr 5.03 > 3.82  Resolved Hospital Problem list   AGMA  Assessment & Plan:  Pre-renal AKI on CKD stage IIIb from acute diarrheal illness -  No recent sick contacts. Lives alone and doesn't share meals with others. She hasn't taken Mexitil in a week. No clear etiology at this time, possible acute viral illness. I believe it is improving based on output this morning.  P: - Trend kidney function - Given Albumin 25% in 159ml , transiently improve intravascular volume - Gentle hydration with NS  - Nephrology following   Acute hypoxic respiratory failure with hypoxia secondary to acute on chronic HFrEF - on 2 liters [place] this morning P: - wean supplemental oxygen as tolerated   Acute metabolic acidosis Hypokalemia - patient started on bicarb drip on admission for AGMA, overcorrected P: - Stop Bicarb - 30 meq of K given, avoiding overcorrection in setting of AKI - F/u BMP at 2 PM  Normocytic anemia Thrombocytopenia Drop in Hgb and Platelets overnight.  History of GI bleed  Normotensive this morning.No melena overnight or hematochezia. Will call this dilutional for now . Likely presented with hemoconcentration and acute drop in platelets from acute illness.  P - stop heparin - monitor  Paroxysmal atrial fibrillation - not on apixiban at home due to GI bleeds - in sinus this am P - holding Heparin in setting of platelet and Hgb drop  Best Practice (right click and "Reselect all SmartList Selections" daily)   Diet/type: Regular consistency (see orders) DVT prophylaxis: otherhold in s GI prophylaxis: PPI Lines: N/A Foley:  N/A Code Status:  full code Last date of multidisciplinary goals of care discussion [x]   Tamsen Snider, MD PGY3 Internal Medicine [phone]

## 2022-01-01 NOTE — Progress Notes (Signed)
Pt. Refused bipap, but understands that if she gets into distress that she will have to wear it. RN aware.

## 2022-01-01 NOTE — Progress Notes (Signed)
[facility] Progress Note  Name: Patricia Wagner MRN: [id number] DOB: 1951-10-06  Chief Complaint:  Loose stools and malaise  Subjective:  she has had 175 mL UOP over 2/7 via her chronic foley.  She has been in the ICU.  Labs this am not available.  We took off the bipap with charge RN at bedside for a few minutes to talk.  She would want dialysis in an emergency "well, I wouldn't have a choice" but she hopes it hasn't come to that point.  Sats dropped off of bipap - she was placed back on after our conversation.   Review of systems:  Denies overt shortness of breath Diarrhea overnight  Chronic foley   ----------- Background on consult:  71 y/o female with a PMH chronic systolic CHF, A-fib not on Eliquis due to history of diverticular GI bleeding, PVCs, CKD stage IIIb, chronic systolic CHF due to nonischemic cardiomyopathy (EF 35 to 40%), rheumatoid arthritis, Raynaud's disease, CVA, COPD, hypertension, hyperlipidemia. She was evaluated by her PCP 5 days prior to presentation for diarrhea and had a near syncopal event in the lobby. She was found to be hypoxic at the office and EMS was called, but she declined. She presented to the ED yesterday for slurred speech,and right arm weakness. She was hypoxic to the 70-80s and was placed on BiPAP after an ABG showed a pH of 7.1, PCO2 of 62, and a PO2 of 79. Pressures were soft and initial labs showed a leukocytosis of 12.9, thrombocytopenia with a platelet count of 98k, bicarb of 16, BUN of 128, sCr of 4.9 (BL of 1.3-1.5). U/A showing large amounts of leukocytes, >50 WBC, and many bacteria with negative nitrites.  Nephrology was consulted for her AKI on CKD stage IIIb.  On evaluation, patient is currently on BiPAP she is answering questions by shaking and nodding her head. She denies new medications. Per bedside nurse she had a "mucous" colored stool x1.    Intake/Output Summary (Last 24 hours) at 01/01/2022 0557 Last data filed at 01/01/2022  0400 Gross per 24 hour  Intake 1853.23 ml  Output 175 ml  Net 1678.23 ml    Vitals:  Vitals:   01/01/22 0300 01/01/22 0331 01/01/22 0430 01/01/22 0445  BP: (!) 89/46 (!) 90/46 (!) 90/52 (!) 89/70  Pulse: (!) 57 77 67 (!) 57  Resp: 20 20 (!) 22 (!) 22  Temp:      TempSrc:      SpO2: 98% 98% 97% 98%  Weight:      Height:         Physical Exam:  General adult female in bed in NAD on bipap initially  HEENT normocephalic atraumatic extraocular movements intact sclera anicteric Neck supple trachea midline Lungs clear to auscultation; on bipap and sats dropped off bipap with speech Heart S1S2 no rub Abdomen soft nontender obese habitus Extremities trace edema  Psych no anxiety or agitation  Neuro - awake and conversant; follows commands and provides a history Gu - foley in place  Medications reviewed   Labs:  BMP Latest Ref Rng & Units 12/31/2021 12/31/2021 12/31/2021  Glucose 70 - 99 mg/dL 76 - -  BUN 8 - 23 mg/dL 130(H) - -  Creatinine 0.44 - 1.00 mg/dL 5.03(H) - -  BUN/Creat Ratio 6 - 22 (calc) - - -  Sodium 135 - 145 mmol/L 137 138 135  Potassium 3.5 - 5.1 mmol/L 4.6 4.3 4.6  Chloride 98 - 111 mmol/L 102 - -  CO2 22 - 32 mmol/L 21(L) - -  Calcium 8.9 - 10.3 mg/dL 8.2(L) - -     Assessment/Plan:   # AKI - appears pre-renal in setting of diuretics/lasix, entresto, and jardiance which are now all on hold - agree. Renal US without hydro and kidneys normal echogenicity - gentle hydration with IV bicarb as tolerated  - am labs not yet available - we discussed that she is near the need for dialysis and she would want dialysis if it were needed emergently   # Acute Hypercarbic resp failure  - on bipap    # Metabolic acidosis - setting of AKI - on bicarb gtt as tolerated   # Chronic combined systolic and diastolic CHF  - home lasix, entresto, and jardiance on hold   # Diarrhea - w/u per primary team  - on bicarb gtt as above    # CKD stage 3 - Cr baseline 1.3 -  1.7   # UTI  - abx per primary team - she is not currently on abx  # Hyperphosphatemia  - await am labs  # Chronic urinary retention  - chronic foley catheter  - foley was exchanged here  Disposition - continue monitoring in ICU  Claudia Desanctis, MD 01/01/2022 5:57 AM

## 2022-01-02 ENCOUNTER — Inpatient Hospital Stay (HOSPITAL_COMMUNITY): Payer: Medicare HMO

## 2022-01-02 DIAGNOSIS — N179 Acute kidney failure, unspecified: Secondary | ICD-10-CM | POA: Diagnosis not present

## 2022-01-02 DIAGNOSIS — I5043 Acute on chronic combined systolic (congestive) and diastolic (congestive) heart failure: Secondary | ICD-10-CM | POA: Diagnosis not present

## 2022-01-02 DIAGNOSIS — J9601 Acute respiratory failure with hypoxia: Secondary | ICD-10-CM | POA: Diagnosis not present

## 2022-01-02 DIAGNOSIS — I509 Heart failure, unspecified: Secondary | ICD-10-CM | POA: Diagnosis not present

## 2022-01-02 DIAGNOSIS — I214 Non-ST elevation (NSTEMI) myocardial infarction: Secondary | ICD-10-CM | POA: Diagnosis not present

## 2022-01-02 LAB — BASIC METABOLIC PANEL
Anion gap: 15 (ref 5–15)
Anion gap: 12 (ref 5–15)
BUN: 134 mg/dL — ABNORMAL HIGH (ref 8–23)
BUN: 129 mg/dL — ABNORMAL HIGH (ref 8–23)
CO2: 25 mmol/L (ref 22–32)
CO2: 26 mmol/L (ref 22–32)
Calcium: 8.4 mg/dL — ABNORMAL LOW (ref 8.9–10.3)
Calcium: 8.1 mg/dL — ABNORMAL LOW (ref 8.9–10.3)
Chloride: 96 mmol/L — ABNORMAL LOW (ref 98–111)
Chloride: 97 mmol/L — ABNORMAL LOW (ref 98–111)
Creatinine, Ser: 5.24 mg/dL — ABNORMAL HIGH (ref 0.44–1.00)
Creatinine, Ser: 5.05 mg/dL — ABNORMAL HIGH (ref 0.44–1.00)
GFR, Estimated: 8 mL/min — ABNORMAL LOW (ref >60)
GFR, Estimated: 9 mL/min — ABNORMAL LOW (ref >60)
Glucose, Bld: 110 mg/dL — ABNORMAL HIGH (ref 70–99)
Glucose, Bld: 126 mg/dL — ABNORMAL HIGH (ref 70–99)
Potassium: 3.9 mmol/L (ref 3.5–5.1)
Potassium: 3.8 mmol/L (ref 3.5–5.1)
Sodium: 136 mmol/L (ref 135–145)
Sodium: 135 mmol/L (ref 135–145)

## 2022-01-02 LAB — CBC
HCT: 44.5 % (ref 36.0–46.0)
Hemoglobin: 13.3 g/dL (ref 12.0–15.0)
MCH: 27.5 pg (ref 26.0–34.0)
MCHC: 29.9 g/dL — ABNORMAL LOW (ref 30.0–36.0)
MCV: 92.1 fL (ref 80.0–100.0)
Platelets: 63 10*3/uL — ABNORMAL LOW (ref 150–400)
RBC: 4.83 MIL/uL (ref 3.87–5.11)
RDW: 15.2 % (ref 11.5–15.5)
WBC: 11.8 10*3/uL — ABNORMAL HIGH (ref 4.0–10.5)
nRBC: 1.2 % — ABNORMAL HIGH (ref 0.0–0.2)

## 2022-01-02 LAB — GLUCOSE, CAPILLARY
Glucose-Capillary: 108 mg/dL — ABNORMAL HIGH (ref 70–99)
Glucose-Capillary: 109 mg/dL — ABNORMAL HIGH (ref 70–99)
Glucose-Capillary: 110 mg/dL — ABNORMAL HIGH (ref 70–99)
Glucose-Capillary: 140 mg/dL — ABNORMAL HIGH (ref 70–99)
Glucose-Capillary: 141 mg/dL — ABNORMAL HIGH (ref 70–99)
Glucose-Capillary: 151 mg/dL — ABNORMAL HIGH (ref 70–99)

## 2022-01-02 LAB — POCT I-STAT 7, (LYTES, BLD GAS, ICA,H+H)
Acid-base deficit: 2 mmol/L (ref 0.0–2.0)
Bicarbonate: 27.4 mmol/L (ref 20.0–28.0)
Calcium, Ion: 1.06 mmol/L — ABNORMAL LOW (ref 1.15–1.40)
HCT: 40 % (ref 36.0–46.0)
Hemoglobin: 13.6 g/dL (ref 12.0–15.0)
O2 Saturation: 92 %
Potassium: 3.7 mmol/L (ref 3.5–5.1)
Sodium: 134 mmol/L — ABNORMAL LOW (ref 135–145)
TCO2: 29 mmol/L (ref 22–32)
pCO2 arterial: 66.6 mmHg (ref 32.0–48.0)
pH, Arterial: 7.222 — ABNORMAL LOW (ref 7.350–7.450)
pO2, Arterial: 78 mmHg — ABNORMAL LOW (ref 83.0–108.0)

## 2022-01-02 LAB — COOXEMETRY PANEL
Carboxyhemoglobin: 1.3 % (ref 0.5–1.5)
Methemoglobin: 1.5 % (ref 0.0–1.5)
O2 Saturation: 90.6 %
Total hemoglobin: 11.9 g/dL — ABNORMAL LOW (ref 12.0–16.0)

## 2022-01-02 MED ORDER — MIDODRINE HCL 5 MG PO TABS
10.0000 mg | ORAL_TABLET | Freq: Three times a day (TID) | ORAL | Status: DC
  Administered 2022-01-02 – 2022-01-18 (×47): 10 mg via ORAL
  Filled 2022-01-02 (×48): qty 2

## 2022-01-02 MED ORDER — HEPARIN SODIUM (PORCINE) 5000 UNIT/ML IJ SOLN
5000.0000 [IU] | Freq: Three times a day (TID) | INTRAMUSCULAR | Status: DC
  Administered 2022-01-02 – 2022-01-05 (×10): 5000 [IU] via SUBCUTANEOUS
  Filled 2022-01-02 (×10): qty 1

## 2022-01-02 MED ORDER — SODIUM CHLORIDE 0.9 % IV SOLN: 250.0000 mL | INTRAVENOUS | Status: DC

## 2022-01-02 MED ORDER — NOREPINEPHRINE 4 MG/250ML-% IV SOLN
INTRAVENOUS | Status: AC
  Filled 2022-01-02: qty 250

## 2022-01-02 MED ORDER — SODIUM CHLORIDE 0.9 % IV SOLN: INTRAVENOUS | Status: DC

## 2022-01-02 MED ORDER — SODIUM CHLORIDE 0.9% FLUSH
10.0000 mL | Freq: Two times a day (BID) | INTRAVENOUS | Status: DC
  Administered 2022-01-03 – 2022-01-07 (×7): 10 mL
  Administered 2022-01-10: 20 mL
  Administered 2022-01-10 – 2022-01-18 (×13): 10 mL

## 2022-01-02 MED ORDER — SODIUM CHLORIDE 0.9% FLUSH: 10.0000 mL | INTRAVENOUS | Status: DC | PRN

## 2022-01-02 MED ORDER — SODIUM BICARBONATE 8.4 % IV SOLN
50.0000 meq | Freq: Once | INTRAVENOUS | Status: AC
  Administered 2022-01-02: 50 meq via INTRAVENOUS
  Filled 2022-01-02: qty 50

## 2022-01-02 MED ORDER — MIDODRINE HCL 5 MG PO TABS: 10.0000 mg | ORAL_TABLET | Freq: Three times a day (TID) | ORAL | Status: DC

## 2022-01-02 MED ORDER — NOREPINEPHRINE 4 MG/250ML-% IV SOLN
0.0000 ug/min | INTRAVENOUS | Status: DC
  Administered 2022-01-02: 19:00:00 3 ug/min via INTRAVENOUS

## 2022-01-02 MED ORDER — HEPARIN SODIUM (PORCINE) 1000 UNIT/ML DIALYSIS
1000.0000 [IU] | INTRAMUSCULAR | Status: DC | PRN
  Administered 2022-01-02: 19:00:00 2400 [IU] via INTRAVENOUS_CENTRAL
  Filled 2022-01-02: qty 3
  Filled 2022-01-02 (×2): qty 6

## 2022-01-02 MED ORDER — NOREPINEPHRINE 4 MG/250ML-% IV SOLN: 0.0000 ug/min | INTRAVENOUS | Status: DC

## 2022-01-02 NOTE — NC FL2 (Signed)
[place] LEVEL OF CARE SCREENING TOOL     IDENTIFICATION  Patient Name: Patricia Wagner Birthdate: Aug 11, 1951 Sex: female Admission Date (Current Location): 12/30/2021  [facility] and [place] Number:  Herbalist and Address:  [facility] [place], [place] [address] [place], [place] [address]      Provider Number: [id number]  Attending Physician Name and Address:  Jacky Kindle, MD  Relative Name and Phone Number:       Current Level of Care: Hospital Recommended Level of Care: [place] Prior Approval Number:    Date Approved/Denied:   PASRR Number: [id number]  Discharge Plan: SNF    Current Diagnoses: Patient Active Problem List   Diagnosis Date Noted   Slurred speech 12/31/2021   Thrombocytopenia ([place]) 12/31/2021   Leukocytosis 12/31/2021   Acute renal failure (ARF) ([place]) 12/31/2021   LRTI (lower respiratory tract infection) 08/29/2021   Age-related osteoporosis without current pathological fracture 08/28/2021   Cough productive of purulent sputum 08/28/2021   CKD (chronic kidney disease) stage 3, GFR 30-59 ml/min ([place])    Visit for screening mammogram 05/22/2021   Steroid dependence ([place]) 05/22/2021   Estrogen deficiency 05/22/2021   Encounter for general adult medical examination with abnormal findings 05/22/2021   Dyslipidemia, goal LDL below 130 05/22/2021   Primary osteoarthritis of right knee 03/20/2020   Persistent atrial fibrillation ([place]) 10/13/2019   Allergic rhinitis 09/10/2019   Vitamin D deficiency 04/15/2019   Acute renal failure superimposed on stage 3b chronic kidney disease ([place]) 83/15/1761   Chronic systolic (congestive) heart failure (HCC) 03/05/2018   Chronic atrial fibrillation 03/05/2018   Urinary retention 11/23/2017   Bradycardia 11/20/2016   Acute on chronic combined systolic and diastolic CHF (congestive heart failure) ([place]) 11/20/2016   Cerebrovascular accident (CVA) due to embolism of  left cerebellar artery (HCC)    PAF (paroxysmal atrial fibrillation) (HCC)    CHF (congestive heart failure) ([place]) 10/31/2016   CAD in native artery, s/p cardiac cath with non obstructive CAD 10/24/2016   NICM (nonischemic cardiomyopathy) ([place]) 10/24/2016   Tobacco abuse 10/20/2016   Degenerative arthritis of left knee 03/15/2012   Encounter for well adult exam with abnormal findings 03/12/2012   Hypersomnia    MENOPAUSAL DISORDER 01/09/2011   HLD (hyperlipidemia) 02/03/2008   Essential hypertension 07/22/2007   Raynaud's syndrome 07/22/2007    Orientation RESPIRATION BLADDER Height & Weight     Self, Time, Situation, Place  O2 (2L) Incontinent, External catheter Weight: 220 lb 10.9 oz (100.1 kg) Height:  5\' 4"  (162.6 cm)  BEHAVIORAL SYMPTOMS/MOOD NEUROLOGICAL BOWEL NUTRITION STATUS      Incontinent, Colostomy (rectal tube) Diet (Please See DC Summary)  AMBULATORY STATUS COMMUNICATION OF NEEDS Skin   Extensive Assist Verbally Normal                       Personal Care Assistance Level of Assistance  Bathing, Feeding, Dressing Bathing Assistance: Limited assistance Feeding assistance: Limited assistance Dressing Assistance: Limited assistance     Functional Limitations Info  Sight, Hearing, Speech Sight Info: Impaired Hearing Info: Adequate Speech Info: Adequate    SPECIAL CARE FACTORS FREQUENCY  PT (By licensed PT), OT (By licensed OT)     PT Frequency: 5x/week OT Frequency: 5x/week            Contractures Contractures Info: Not present    Additional Factors Info  Code Status, Allergies Code Status Info: FULL Allergies Info: Ace Inhibitors  Current Medications (01/02/2022):  This is the current hospital active medication list Current Facility-Administered Medications  Medication Dose Route Frequency Provider Last Rate Last Admin   0.9 %  sodium chloride infusion   Intravenous PRN Jacky Kindle, MD 10 mL/hr at 01/02/22 1435 IV Pump  Association at 01/02/22 1435   acetaminophen (TYLENOL) tablet 650 mg  650 mg Oral Q6H PRN Shela Leff, MD       Or   acetaminophen (TYLENOL) suppository 650 mg  650 mg Rectal Q6H PRN Shela Leff, MD       cefTRIAXone (ROCEPHIN) 1 g in sodium chloride 0.9 % 100 mL IVPB  1 g Intravenous Q24H Jacky Kindle, MD   Stopped at 01/02/22 1347   chlorhexidine (PERIDEX) 0.12 % solution 15 mL  15 mL Mouth Rinse BID Jacky Kindle, MD   15 mL at 01/01/22 2246   Chlorhexidine Gluconate Cloth 2 % PADS 6 each  6 each Topical Daily Jacky Kindle, MD   6 each at 01/01/22 1017   heparin injection 5,000 Units  5,000 Units Subcutaneous Q8H Madalyn Rob, MD   5,000 Units at 01/02/22 1317   MEDLINE mouth rinse  15 mL Mouth Rinse q12n4p Jacky Kindle, MD   15 mL at 01/01/22 1443   midodrine (PROAMATINE) tablet 10 mg  10 mg Oral TID WC Claudia Desanctis, MD   10 mg at 01/02/22 1127   pantoprazole (PROTONIX) EC tablet 40 mg  40 mg Oral Daily Madalyn Rob, MD   40 mg at 01/02/22 0347     Discharge Medications: Please see discharge summary for a list of discharge medications.  Relevant Imaging Results:  Relevant Lab Results:   Additional Information SSN#: [id number] PFIZER Comrnaty([place] TOP) Covid-19 Vaccine 05/14/2021 , 12/17/2020  Pfizer COVID-19 Vaccine 05/24/2020 , 04/28/2020  [place] Oborn, LCSW

## 2022-01-02 NOTE — Consult Note (Addendum)
Advanced Heart Failure Team Consult Note   Primary Physician: Janith Lima, MD PCP-Cardiologist:  Glori Bickers, MD  Reason for Consultation: Acute on Chronic Biventricular Heart Failure   HPI:    Patricia Wagner is seen today for evaluation of acute on chronic biventricular heart failure request of Dr. [name], Cardiology.   Patricia Wagner is a 71 y.o. female with a past medical history of paroxysmal atrial fibrillation, NICM cardiomyopathy, Biventricular CHF, HTN, chronic foley due to urinary retentin, CKD Stage III, and HLD.    She has struggled with symptomatic afib since 10/2016.   Pt admitted to [facility] 6/18 with cardiogenic shock in the setting of persistent AF. Had tachy CM with EF 15% with severe biventricular dysfunction. Started on amio gtt and PICC line placed for dobutamine with low output HF. Underwent DCCV 05/06/17 but went back into Afib 05/08/17 and developed recurrent cardiogenic shock, requiring re-initiation of dobutamine and amio. Pt had repeat DCCV 05/12/17 with successful conversion to NSR. Discharged on amio and Ranexa.   Echo 8/18 EF 30-35%,severe left ventricular hypertrophy, mild to moderate mitral regurgitation, biatrial enlargement and moderately reduced RV function. Fat pad bx negative. PYP 10/18 felt equivocal but not favored for amyloid.    Admitted 2/19 with UTI and AKI. Found to have neurogenic bladder with urinary retention .Creatinine peaked at 7. Lasix, spiro, and entresto stopped. Required home IV antibiotics and discharged with foley. At the time of discharge creatinine was down to 1.07.    Admitted 4/19 with lower GI bleed. Colonoscopy showed diverticular disease. Tagged NM scan 03/15/18 with no active bleeding   Admitted 9/19. With acute hypoxic respiratory failure due a/c systolic/diastolic heart failure. Required short term intubation and diuresis with IV lasix.    Echo 8/20 EF 45-50%.     She is now status post AF ablation 10/13/2019. Eliquis  stopped due to recurrent GI bleeding. Follows with Dr. Dia Sitter 7/22 1. Sinus rhythm - min HR 66 bpm, max HR 203 bpm, avg HR of 88 bpm. 2. Seven runs of NSVT - the run with the fastest interval lasting 5 beats with a max rate of 203 bpm, the longest  lasting 9 beats with an avg rate of 160 bpm.  3. Three runs of SVT - the run with the fastest interval lasting 6 beats with a max rate of 203 bpm, the longest lasting 17 beats with an avg rate of 108 bpm. 4. Frequent PACs  (5.4%, 10918) 5. Frequent PVCs  (7.0%, 14247) - with one predominant morphology (6.6%)  Echo 9/22: EF read as 35-40% ([name] felt closer to 30%).  RV normal. Trivial TR   At last OV in the [facility] 9/22, mexiletine was added for PVC suppression and Entresto also added. Office Wt was 209 lb.   She presented to the ED on 2/6 w/ multiple complaints including diarrhea, near syncope, slurred speech and SOB. Found to be in acute hypoxic respiratory failure requiring BiPAP and AKI w/ SCr of 4.95 (baseline 1.3-1.5), BUN 128. CO2 16. UA + for UTI. UCx + Klebsiella Pneumoniae + Serratia Marcescens. Also ruled in for NSTEMI, Hs trop 5,916>>5,616>>5,916. EKG showed normal sinus rhythm with occasional PVC, right axis deviation; unchanged compared to August 12, 2021. Head CT showed old left cerebellar infarct but negative for acute abnormalities. CXR showed cardiomegaly w/ vascular congestion but she was felt to be intravascularly dry from large fluid loss/diarrhea. Home diuretics/HF Meds held. Placed on bicarb gtt and abx  w/ ceftriaxone. Heparin gtt started for NSTEMI. Admitted by CCM. Cardiology and nephrology teams consulted.   Echo EF 40-45%, RV moderately reduced, Mod-severe TR, RVSP 56.7 mmHg.  Renal US no acute findings, no hydronephrosis.   Today, she is off bicarb gtt, now getting NaCl 75 mL/hr. I/Os net + 4.6L. SCr trend 4.95>5.03>>3.83>>5.11>>5.24>>5.24. BUN 134 today. K 3.9. Oliguric, only 365 cc in UOP yesterday. BP has  been variable but has had hypotension through admission. Currently on midodrine 10 mg tid for support. Most recent charted BP 94/57.   She feels tired. Appears confused. Still SOB. Now off BiPAP and on 4L [facility]. Currently NSR w/ frequent PVCs ~20-25/min and runs of NSVT, up to 10 beats on tele. Denies CP. K 3.9. Mg 2.1 (yesterday). She is up 11 lb from previous clinic dry wt in September.   Review of Systems: [y] = yes, [ ]  = no   General: Weight gain [ Y]; Weight loss [ ] ; Anorexia [ ] ; Fatigue [ Y]; Fever [ ] ; Chills [ ] ; Weakness [Y ]  Cardiac: Chest pain/pressure [ ] ; Resting SOB [ Y]; Exertional SOB [ Y]; Orthopnea [ ] ; Pedal Edema [Y ]; Palpitations [ ] ; Syncope [ ] ; Presyncope [ Y]; Paroxysmal nocturnal dyspnea[ ]   Pulmonary: Cough [ ] ; Wheezing[ ] ; Hemoptysis[ ] ; Sputum [ ] ; Snoring [ ]   GI: Vomiting[ ] ; Dysphagia[ ] ; Melena[ ] ; Hematochezia [ ] ; Heartburn[ ] ; Abdominal pain [ ] ; Constipation [ ] ; Diarrhea [Y ]; BRBPR [ ]   GU: Hematuria[ ] ; Dysuria [ ] ; Nocturia[ ]   Vascular: Pain in legs with walking [ ] ; Pain in feet with lying flat [ ] ; Non-healing sores [ ] ; Stroke [ ] ; TIA [ ] ; Slurred speech [ ] ;  Neuro: Headaches[ ] ; Vertigo[ ] ; Seizures[ ] ; Paresthesias[ ] ;Blurred vision [ ] ; Diplopia [ ] ; Vision changes [ ]   Ortho/Skin: Arthritis [ ] ; Joint pain [ ] ; Muscle pain [ ] ; Joint swelling [ ] ; Back Pain [ ] ; Rash [ ]   Psych: Depression[ ] ; Anxiety[ ]   Heme: Bleeding problems [ ] ; Clotting disorders [ ] ; Anemia [ ]   Endocrine: Diabetes [ ] ; Thyroid dysfunction[ ]   Home Medications Prior to Admission medications   Medication Sig Start Date End Date Taking? Authorizing Provider  Acetaminophen (TYLENOL PO) Take 1 tablet by mouth every 6 (six) hours as needed (headache/pain).   Yes [provider]  furosemide (LASIX) 80 MG tablet Take 1 tablet (80 mg total) by mouth daily. Take extra tab in PM as needed Patient taking differently: Take 80 mg by mouth daily. Take extra 80mg  in PM as  needed 08/12/21  Yes Livi Mcgann, Shaune Pascal, MD  JARDIANCE 10 MG TABS tablet TAKE 1 TABLET BY MOUTH DAILY BEFORE BREAKFAST. Patient taking differently: Take 10 mg by mouth daily. 08/26/21  Yes Deniqua Perry, Shaune Pascal, MD  Potassium Chloride ER 20 MEQ TBCR TAKE 1 TABLET BY MOUTH EVERY DAY Patient taking differently: Take 20 mEq by mouth daily. 09/27/21  Yes Madylin Fairbank, Shaune Pascal, MD  predniSONE (DELTASONE) 5 MG tablet Take 1 tablet (5 mg total) by mouth daily with breakfast. Patient taking differently: Take 5 mg by mouth daily with breakfast. Continuous 10/15/21  Yes Ofilia Neas, PA-C  rosuvastatin (CRESTOR) 10 MG tablet Take 1 tablet (10 mg total) by mouth daily. 05/23/21  Yes Janith Lima, MD  sacubitril-valsartan (ENTRESTO) 24-26 MG Take 1 tablet by mouth 2 (two) times daily. 08/12/21  Yes Rahshawn Remo, Shaune Pascal, MD  leflunomide (ARAVA) 20 MG tablet Take 1  tablet (20 mg total) by mouth daily. Patient not taking: Reported on 01/01/2022 10/15/21   Su Monks    Past Medical History: Past Medical History:  Diagnosis Date   Acute blood loss anemia 03/05/2018   Acute hypercapnic respiratory failure ([place]) 04/30/2017   Acute renal failure superimposed on chronic kidney disease ([place]) 10/20/2016   Arthritis    hands, knees   Atrial fibrillation ([place])    a. s/p multiple cardioversions; failed tikosyn/sotalol.   Bradycardia 11/20/2016   CAD in native artery, s/p cardiac cath with non obstructive CAD 10/24/2016   Cerebrovascular accident (CVA) due to embolism of left cerebellar artery (HCC)    CHF (congestive heart failure) ([place])    Chronic anticoagulation 03/15/2018   Chronic atrial fibrillation ([place]) 1/44/3154   Chronic systolic (congestive) heart failure ([place]) 03/05/2018   CKD (chronic kidney disease) stage 3, GFR 30-59 ml/min (HCC)    Coagulopathy ([place]) 03/05/2018   DIVERTICULITIS, HX OF 07/25/2007   Diverticulosis of colon with hemorrhage 07/22/2007   Qualifier: Diagnosis of  By: [name]      DIVERTICULOSIS, COLON 07/22/2007   Dysuria 07/21/2017   Edema, peripheral    a. chronic BLE edema, R>L. Prior trauma from dog attack and accident.   Essential hypertension 07/22/2007   Qualifier: Diagnosis of  By: [name]     History of kidney stones    HLD (hyperlipidemia) 02/03/2008   Qualifier: Diagnosis of  By: [name], James W    HYPERLIPIDEMIA 02/03/2008   Hypersomnia    declines w/u   Hypertension    Hypotension (arterial) 04/30/2017   MENOPAUSAL DISORDER 01/09/2011   Morbid obesity ([place]) 07/22/2007   NICM (nonischemic cardiomyopathy) ([place]) 10/24/2016   PAF (paroxysmal atrial fibrillation) ([place])    Presence of indwelling urinary catheter    supra pubic cath   Raynaud's syndrome 07/22/2007   Stroke ([place]) 2017   Suspected sleep apnea 05/20/2017   THYROID NODULE, RIGHT 01/04/2010   VITAMIN D DEFICIENCY 01/09/2011   Qualifier: Diagnosis of  By: [name], [name]     Past Surgical History: Past Surgical History:  Procedure Laterality Date   ATRIAL FIBRILLATION ABLATION N/A 10/13/2019   Procedure: ATRIAL FIBRILLATION ABLATION;  Surgeon: [name], MD;  Location: [place] [facility];  Service: Cardiovascular;  Laterality: N/A;   CARDIAC CATHETERIZATION N/A 10/23/2016   Procedure: Left Heart Cath and Coronary Angiography;  Surgeon: [name], MD;  Location: [place] [facility];  Service: Cardiovascular;  Laterality: N/A;   CARDIOVERSION N/A 10/31/2016   Procedure: CARDIOVERSION;  Surgeon: [name], MD;  Location: [place];  Service: Cardiovascular;  Laterality: N/A;   CARDIOVERSION N/A 11/03/2016   Procedure: CARDIOVERSION;  Surgeon: [name], MD;  Location: [place];  Service: Cardiovascular;  Laterality: N/A;   CARDIOVERSION N/A 11/18/2016   Procedure: CARDIOVERSION;  Surgeon: [name], MD;  Location: [facility] ENDOSCOPY;  Service: Cardiovascular;  Laterality: N/A;   CARDIOVERSION N/A 03/25/2017   Procedure: CARDIOVERSION;  Surgeon: [name], MD;  Location: [facility] ENDOSCOPY;  Service: Cardiovascular;  Laterality: N/A;   CARDIOVERSION N/A 05/06/2017   Procedure: CARDIOVERSION;  Surgeon: [name], MD;  Location: [facility] ENDOSCOPY;  Service: Cardiovascular;  Laterality: N/A;   CARDIOVERSION N/A 05/12/2017   Procedure: CARDIOVERSION;  Surgeon: [name], MD;  Location: [facility] ENDOSCOPY;  Service: Cardiovascular;  Laterality: N/A;   CHOLECYSTECTOMY     COLONOSCOPY N/A 12/04/2017   Procedure: COLONOSCOPY;  Surgeon: [name],  MD;  Location: [place] ENDOSCOPY;  Service: Endoscopy;  Laterality: N/A;   COLONOSCOPY     COLONOSCOPY W/ POLYPECTOMY  02/2011   pan diverticulosis.  tubular adenoma without dysplasia on 5 mm sigmoid polyp.  Dr Fuller Plan.     ESOPHAGOGASTRODUODENOSCOPY (EGD) WITH PROPOFOL N/A 05/16/2020   Procedure: ESOPHAGOGASTRODUODENOSCOPY (EGD) WITH PROPOFOL;  Surgeon: Milus Banister, MD;  Location: [facility] ENDOSCOPY;  Service: Endoscopy;  Laterality: N/A;   FLEXIBLE SIGMOIDOSCOPY N/A 01/18/2020   Procedure: FLEXIBLE SIGMOIDOSCOPY;  Surgeon: [name], DO;  Location: [place];  Service: Gastroenterology;  Laterality: N/A;   HEMOSTASIS CLIP PLACEMENT  01/18/2020   Procedure: HEMOSTASIS CLIP PLACEMENT;  Surgeon: [name], DO;  Location: [place];  Service: Gastroenterology;;   HOT HEMOSTASIS N/A 05/16/2020   Procedure: HOT HEMOSTASIS (ARGON PLASMA COAGULATION/BICAP);  Surgeon: Milus Banister, MD;  Location: [facility];  Service: Endoscopy;  Laterality: N/A;   PARTIAL HYSTERECTOMY     1 OVARY LEFT   SCLEROTHERAPY  01/18/2020   Procedure: SCLEROTHERAPY;  Surgeon: [name], DO;  Location: [place] ENDOSCOPY;  Service: Gastroenterology;;   TEE WITHOUT CARDIOVERSION N/A 10/31/2016   Procedure: TRANSESOPHAGEAL ECHOCARDIOGRAM (TEE);  Surgeon: [name], MD;  Location: [facility] ENDOSCOPY;  Service: Cardiovascular;  Laterality: N/A;   TEE WITHOUT CARDIOVERSION N/A 03/25/2017   Procedure:  TRANSESOPHAGEAL ECHOCARDIOGRAM (TEE);  Surgeon: [name], MD;  Location: [facility] ENDOSCOPY;  Service: Cardiovascular;  Laterality: N/A;   TOTAL KNEE ARTHROPLASTY Right 04/10/2020   Procedure: TOTAL KNEE ARTHROPLASTY;  Surgeon: Renette Butters, MD;  Location: [facility];  Service: Orthopedics;  Laterality: Right;    Family History: Family History  Problem Relation Age of Onset   Asthma Mother    Diabetes Father    Heart disease Father        Died of presumed heart attack - 67s   Lung disease Sister    Heart disease Sister        Twin sister has heart issue, unclear what kind   Healthy Daughter    Thyroid disease Neg Hx    Colon polyps Neg Hx    Esophageal cancer Neg Hx    Pancreatic cancer Neg Hx    Stomach cancer Neg Hx     Social History: Social History   Socioeconomic History   Marital status: Single    Spouse name: Not on file   Number of children: 1   Years of education: Not on file   Highest education level: Not on file  Occupational History   Occupation: Academic librarian   Tobacco Use   Smoking status: Former    Packs/day: 0.50    Types: Cigarettes    Quit date: 2019    Years since quitting: 4.1   Smokeless tobacco: Never  Vaping Use   Vaping Use: Never used  Substance and Sexual Activity   Alcohol use: Yes    Comment: occ   Drug use: No   Sexual activity: Not on file  Other Topics Concern   Not on file  Social History Narrative   Not on file   Social Determinants of Health   Financial Resource Strain: Not on file  Food Insecurity: No Food Insecurity   Worried About Running Out of Food in the Last Year: Never true   Ran Out of Food in the Last Year: Never true  Transportation Needs: No Transportation Needs   Lack of Transportation (Medical): No   Lack of Transportation (Non-Medical): No  Physical Activity: Not on  file  Stress: Not on file  Social Connections: Not on file    Allergies:  Allergies  Allergen Reactions   Ace Inhibitors Palpitations     Objective:    Vital Signs:   Temp:  [97.6 F (36.4 C)-98.1 F (36.7 C)] 98.1 F (36.7 C) (02/09 0700) Pulse Rate:  [53-207] 71 (02/09 1043) Resp:  [14-36] 19 (02/09 1043) BP: (71-137)/(42-112) 94/57 (02/09 1043) SpO2:  [87 %-100 %] 96 % (02/09 1043) Weight:  [100.1 kg] 100.1 kg (02/09 0500) Last BM Date: 01/02/22  Weight change: Filed Weights   12/30/21 2229 01/02/22 0500  Weight: 98.9 kg 100.1 kg    Intake/Output:   Intake/Output Summary (Last 24 hours) at 01/02/2022 1059 Last data filed at 01/02/2022 1000 Gross per 24 hour  Intake 2955.03 ml  Output 425 ml  Net 2530.03 ml      Physical Exam    General:  fatigued appearing. No resp difficulty HEENT: normal Neck: supple. Distended neck veins, JVD ~12 cm . Carotids 2+ bilat; no bruits. No lymphadenopathy or thyromegaly appreciated. Cor: PMI nondisplaced. Irregular and tachy (frequent PVCs). 3/6 TR murmur  Lungs: decreased BS at the bases bilaterally  Abdomen: soft, nontender, nondistended. No hepatosplenomegaly. No bruits or masses. Good bowel sounds. Extremities: no cyanosis, clubbing, rash, 1+ b/l edema, warm  Neuro: alert but some confusion, Affect pleasant   Telemetry   NSR w/ Frequent PVCs ~20-25/min, runs of NSVT   EKG    NSR 83 bpm, occasional PVCs, right axis deviation  Labs   Basic Metabolic Panel: Recent Labs  Lab 12/30/21 2342 12/30/21 2348 12/31/21 0005 12/31/21 0827 12/31/21 1158 12/31/21 1610 01/01/22 0643 01/01/22 1449 01/02/22 0253  NA 136   < > 135   < > 138 137 136 138 136  K 4.5   < > 4.4   < > 4.3 4.6 2.9* 4.0 3.9  CL 104  --  104  --   --  102 81* 96* 96*  CO2 16*  --   --   --   --  21* 44* 26 25  GLUCOSE 97  --  92  --   --  76 67* 120* 110*  BUN 128*  --  >130*  --   --  130* 111* 133* 134*  CREATININE 4.95*  --  4.90*  --   --  5.03* 3.82* 5.11* 5.24*  CALCIUM 8.6*  --   --   --   --  8.2* 5.9* 8.1* 8.4*  MG  --   --   --   --   --  2.8* 2.1  --   --   PHOS  --   --    --   --   --  7.3* 5.0*  --   --    < > = values in this interval not displayed.    Liver Function Tests: Recent Labs  Lab 12/30/21 2342  AST 41  ALT 28  ALKPHOS 151*  BILITOT 1.3*  PROT 6.0*  ALBUMIN 3.0*   No results for input(s): LIPASE, AMYLASE in the last 168 hours. No results for input(s): AMMONIA in the last 168 hours.  CBC: Recent Labs  Lab 12/30/21 2342 12/30/21 2348 12/31/21 0005 12/31/21 0827 12/31/21 1158 01/01/22 0643 01/02/22 0253  WBC 12.9*  --   --   --   --  9.0 11.8*  NEUTROABS 11.5*  --   --   --   --   --   --  HGB 12.8   < > 14.6 13.3 13.6 9.9* 13.3  HCT 41.9   < > 43.0 39.0 40.0 30.7* 44.5  MCV 93.1  --   --   --   --  86.7 92.1  PLT 98*  --   --   --   --  59* 63*   < > = values in this interval not displayed.    Cardiac Enzymes: No results for input(s): CKTOTAL, CKMB, CKMBINDEX, TROPONINI in the last 168 hours.  BNP: BNP (last 3 results) Recent Labs    12/30/21 2342  BNP >4,500.0*    ProBNP (last 3 results) No results for input(s): PROBNP in the last 8760 hours.   CBG: Recent Labs  Lab 01/01/22 1107 01/01/22 1547 01/01/22 2022 01/02/22 0000 01/02/22 0738  GLUCAP 93 119* 120* 108* 109*    Coagulation Studies: Recent Labs    12/30/21 2342  LABPROT 17.6*  INR 1.5*     Imaging   No results found.   Medications:     Current Medications:  chlorhexidine  15 mL Mouth Rinse BID   Chlorhexidine Gluconate Cloth  6 each Topical Daily   mouth rinse  15 mL Mouth Rinse q12n4p   midodrine  10 mg Oral TID WC   pantoprazole  40 mg Oral Daily    Infusions:  sodium chloride Stopped (01/01/22 0821)   sodium chloride 75 mL/hr at 01/02/22 1000   cefTRIAXone (ROCEPHIN)  IV Stopped (01/01/22 1428)      Patient Profile   71 y/o female w/ chronic biventricular heart failure/ nonischemic CM, felt likely tachymediated from Afib/PVCs, Stage IIIb CKD and neurogenic bladder w/ urinary retention w/ chronic foley, admitted w/  acute hypoxic respiratory failure and AKI, in setting of UTI and a/c CHF.   Assessment/Plan    1. Acute on Chronic Biventricular Heart Failure, w/ Predominant RV Failure - NICM, LHC 2017 w/ mild nonobstructive CAD - Echo 6/18 EF 15% in setting of tachy CM from AF - Echo 02/2018: EF 40-45%, grade 1 DD, RV normal - Echo 8/20 45-50% Personally reviewed - Echo 9/22 EF read as 35-40% (I think closer to 30%) - suspect tachymediated CM from Afib and high PVC burden  - Previous Amyloid w/u negative with negative fat pad biopsy and PYP scan (2018) - Echo this admit, EF 40-45%, RV severely enlarged w/ moderately reduced systolic function  - Now w/ a/c CHF c/b AKI on Stage lllb CKD  - ? Low output. Place CVC to check Co-ox. May need DBA for RV support  - Consider repeat PYP to reassess for TTR amyloid given predominant RV dysfunction, progressive renal failure and autonomic dysfunction  - Unable to tolerate GDMT w/ hypotension and CKD - Continue midodrine 10 tid for BP support, titrate further if needed  - Place [place] boots   2. AKI on CKD Stage IIIb - Baseline SCr 1.3-1.5 - 4.95 on admit. Suspected pre-renal AKI from large fluid loss/diarrhea   - little improvement despite IVF hydration, SCr 5.24 today, BUN 134 - oliguric - some confusion on exam but unsure of baseline - nephrology following, suspect she will need CVVHD  - given degree of RV failure need to assess hemodynamics to r/o low output HF as contributing factor. Place CVC to check Co-ox and follow CVPs (appears fluid overloaded on my assessment)   3. Acute Hypoxic Respiratory Failure - O2 sats 70-80s on admit, requiring BiPAP  - Flu and Covid negative  - admit CXR showed vascular congestion  but diuretics held on admit due to suspected hypovolemic AKI in setting of diarrhea  - now on 4L [place]  - suspect she may need CVVHD for some volume removal   4. UTI  - chronic foley for neurogenic bladder w/ urinary retention  - UA + on admit -  UCx + Klebsiella Pneumoniae + Serratia Marcescens  - Treating w/ ceftriaxone, susceptibilities pending  - AF, WBC 9.0>>11.8. Consider checking blood cx   5. Tricuspid Regurgitation  - trivial TR on echo 9/22 - severe on echo this admit - likely functional, RV severely enlarged   6. PVCs/ NSVT - frequent, 20-25 PVCs per min on tele  - Zio 7/22 showed 7.0% burden, started on mexiletine (held on admit) - start amiodarone gtt   7. NSTEMI  - Hs trop 5,916>>5,616>>5,916 - in the setting of acute renal failure  - Denies CP, EKG nonischemic. LHC 2017 minimal CAD - No plans for LHC   8. PAF - NSR w/ PVCs - if placed on inotropic support, will need amio gtt - not on anticoagulation given h/o recurrent GIBs   9. Thrombocytopenia - Plt 98 on admit, down to 63 today  - off heparin, but doubt HIT given plts were low at baseline - suspect 2/2 critical illness  - hgb ok 13, monitor for bleeding    Length of Stay: 2  Lyda Jester, PA-C  01/02/2022, 10:59 AM  Advanced Heart Failure Team Pager [phone] (M-F; 7a - 5p)  Please contact [facility] for night-coverage after hours (4p -7a ) and weekends on [email or web address]  Agree with above   69 y/o woman with PAF, systolic HF EF 93-26% with moderate RV dysfunction, h/o of urinary tract obstruction with chronic Foley admitted with AKI in setting of n/v/diarrhea.   Now with SCr > 5. BUN 130. Has been getting fluid resuscitated. Weight climbin.g Lethargic but oriented. Remains in NSR  General:  Weak appearing. No resp difficulty HEENT: normal Neck: supple. 8-9  Carotids 2+ bilat; no bruits. No lymphadenopathy or thryomegaly appreciated. Cor: PMI nondisplaced. Regular rate & rhythm. 2/6 TR Lungs: clear Abdomen: soft, nontender, nondistended. No hepatosplenomegaly. No bruits or masses. Good bowel sounds. Extremities: no cyanosis, clubbing, rash, trace edema Neuro: lethargic but oriented follows commands  Suspect initial insult was ATN from  low BP in setting of n/v/d.  Now had been fluid resuscitated, BP soft but acceptable on midodrine. Urine output seems to be picking up some. Will stop IVF for now as she has been sufficiently volume resuscitated and want to give her as much time as possible to avoid need for CVVHD. Will follow. If not improving can consider RHC to further elucidate filling pressures and output.   CRITICAL CARE Performed by: Glori Bickers  Total critical care time: 45 minutes  Critical care time was exclusive of separately billable procedures and treating other patients.  Critical care was necessary to treat or prevent imminent or life-threatening deterioration.  Critical care was time spent personally by me (independent of midlevel providers or residents) on the following activities: development of treatment plan with patient and/or surrogate as well as nursing, discussions with consultants, evaluation of patient's response to treatment, examination of patient, obtaining history from patient or surrogate, ordering and performing treatments and interventions, ordering and review of laboratory studies, ordering and review of radiographic studies, pulse oximetry and re-evaluation of patient's condition.  Glori Bickers, MD  11:02 PM   .

## 2022-01-02 NOTE — TOC Initial Note (Signed)
Transition of Care [facility]) - Initial/Assessment Note    Patient Details  Name: Patricia Wagner MRN: [id number] Date of Birth: 1951-05-30  Transition of Care [facility]) CM/SW Contact:    [place], LCSW Phone Number: 01/02/2022, 2:30 PM  Clinical Narrative:                 CSW received consult for possible SNF placement at time of discharge. CSW spoke with patient at bedside. Patient reported that patient lives alone and is currently unable to care for herself at her home given patients current physical needs and fall risk. Patient expressed understanding of PT recommendation and is not agreeable to SNF placement at time of discharge. Patient reports preference for going home with home health instead. CSW discussed insurance authorization process and provided Medicare SNF ratings list. Patient has received  the COVID vaccines. CSW will send out referrals for review. Patient expressed being hopeful and to feel better soon so she can return home. No further questions reported at this time.   Skilled Nursing Rehab Facilities-   [email or web address]   Ratings out of 5 possible   Name Address  Phone # [place] Inspection Overall  [facility] [address], [place] 5 5 2 4   [facility]  [address], [place] [phone] 4 2 5 5   [facility] [place], [place] 4 1 1 1   [place], [place] 2 2 4 4   [facility] [address] [place] 2 1 2 1   [place] 3 1 4 3    [facility] [address] [place] 5 2 2 3   [facility] [address] [place] 4 1 2 1   [facility] ([facility]) [place], [place] [phone] 5 1 2 2   [facility] [address], [place] [phone] 4 1 1 1   [facility] [address], [facility] [phone] 4 1 2 1   [facility]) [place]. [place], [place] [phone] 4 1 1 1           [place] [address], [place] 4 2 3 3   [facility] [place] [address], [place] 3 1 5 4   [facility], [place] [address], [place] [phone] 2 1 1 1   [facility] [address] [phone] 2 2 3 3           [address] (no [facility]) [address], [place] [phone] 4 5 5 5   [facility] (No Humana) [address] [phone] [address], [place] [phone] 4 1 4 3   [facility] (No UHC) [place], [place] [phone] 5 5 5 5   [facility] [address] [place] [phone] 3 3 4 4   [place] [address], [place]  [phone] 2 2 2 2   [phone] [address] [place] 3 1 3 2   [place] [address] [place] 1 1 2 1   [facility] [address] [place] 2 1 1 1   [place], [place] 5 2 4 5   [facility] [address] [place] 2 1 1 1   [facility] [address], [place] 3 1 1 1   [facility] [address] [facility]  [phone] 2 1 2 1           [facility] [place] [address], [place] [phone] 5 3 3 4   [facility] [address], [place] 2 1 1 1   [place] (No Humana) [address] [place] [phone] 2 1 2 1   [facility] [address] [place] [phone] 3 1 1 1           [facility] [place], [place] 4 1 5 4   [facility]) [place], [place] 2 1 3 2   [facility]) [place] [address] [place] 3 1 4 3   [facility] [address] [place] 4 1 4 3   [address] [place], [place] 3 3 1 1   [facility]) [address] [place] [phone] 3 2 3 3      Expected Discharge Plan: [place] Barriers to Discharge: Continued Medical Work up   Patient Goals and CMS Choice Patient states their goals for this hospitalization and ongoing recovery are:: wants to return home CMS Medicare.gov Compare Post Acute Care list provided to:: Patient Choice offered to / list presented to : Patient  Expected Discharge Plan and Services Expected Discharge Plan: [place] In-house Referral: Clinical Social Work Discharge Planning Services: CM Consult Post Acute Care Choice: [place] arrangements for the past 2 months: Apartment                                      Prior Living Arrangements/Services Living arrangements for the past 2 months: Apartment Lives with:: Self Patient language and need for interpreter reviewed:: Yes Do you feel safe going back to the place where you live?: Yes      Need for Family Participation in Patient Care: No (Comment) Care giver support system in place?: No (comment)   Criminal Activity/Legal Involvement Pertinent to Current Situation/Hospitalization: No - Comment as needed  Activities of Daily Living Home Assistive Devices/Equipment: Environmental consultant (specify type), Cane (specify quad or straight) ADL Screening (condition at time of admission) Patient's cognitive ability adequate to safely complete daily activities?: Yes Is the patient deaf or have difficulty hearing?: No Does the patient have difficulty seeing, even when wearing glasses/contacts?: No Does the patient have difficulty concentrating, remembering, or making decisions?: No Patient able to express need for assistance with ADLs?: Yes Does the patient have difficulty dressing or bathing?: No Independently performs ADLs?: Yes  (appropriate for developmental age) Does the patient have difficulty walking or climbing stairs?: Yes Weakness of Legs: Both Weakness of Arms/Hands: None  Permission Sought/Granted Permission sought to share information with : Case Manager, Chartered certified accountant granted to share information with : Yes, Verbal Permission Granted              Emotional Assessment Appearance:: Appears stated age Attitude/Demeanor/Rapport: Engaged Affect (typically observed): Pleasant Orientation: : Oriented to Self, Oriented to Place, Oriented to  Time, Oriented to Situation   Psych Involvement: No (comment)  Admission diagnosis:  NSTEMI (non-ST elevated myocardial infarction) ([place]) [I21.4] Acute renal failure (ARF) ([place]) [N17.9] Acute CHF (congestive heart failure) ([place]) [I50.9] AKI (acute kidney injury) ([place]) [N17.9] Acute respiratory failure with hypoxia and hypercapnia ([place]) [J96.01, J96.02] Acute renal failure, unspecified acute renal failure type ([place]) [  N17.9] Acute on chronic congestive heart failure, unspecified heart failure type ([place]) [I50.9] Patient Active Problem List   Diagnosis Date Noted   Slurred speech 12/31/2021   Thrombocytopenia ([place]) 12/31/2021   Leukocytosis 12/31/2021   Acute renal failure (ARF) ([place]) 12/31/2021   LRTI (lower respiratory tract infection) 08/29/2021   Age-related osteoporosis without current pathological fracture 08/28/2021   Cough productive of purulent sputum 08/28/2021   CKD (chronic kidney disease) stage 3, GFR 30-59 ml/min (HCC)    Visit for screening mammogram 05/22/2021   Steroid dependence ([place]) 05/22/2021   Estrogen deficiency 05/22/2021   Encounter for general adult medical examination with abnormal findings 05/22/2021   Dyslipidemia, goal LDL below 130 05/22/2021   Primary osteoarthritis of right knee 03/20/2020   Persistent atrial fibrillation ([place]) 10/13/2019   Allergic rhinitis 09/10/2019   Vitamin D deficiency  04/15/2019   Acute renal failure superimposed on stage 3b chronic kidney disease ([place]) 54/00/8676   Chronic systolic (congestive) heart failure ([place]) 03/05/2018   Chronic atrial fibrillation 03/05/2018   Urinary retention 11/23/2017   Bradycardia 11/20/2016   Acute on chronic combined systolic and diastolic CHF (congestive heart failure) ([place]) 11/20/2016   Cerebrovascular accident (CVA) due to embolism of left cerebellar artery (HCC)    PAF (paroxysmal atrial fibrillation) (HCC)    CHF (congestive heart failure) ([place]) 10/31/2016   CAD in native artery, s/p cardiac cath with non obstructive CAD 10/24/2016   NICM (nonischemic cardiomyopathy) ([place]) 10/24/2016   Tobacco abuse 10/20/2016   Degenerative arthritis of left knee 03/15/2012   Encounter for well adult exam with abnormal findings 03/12/2012   Hypersomnia    MENOPAUSAL DISORDER 01/09/2011   HLD (hyperlipidemia) 02/03/2008   Essential hypertension 07/22/2007   Raynaud's syndrome 07/22/2007   PCP:  Janith Lima, MD Pharmacy:   [id number] [address] [place], [place] [address] Phone: [phone] Fax: [phone]     Social Determinants of Health (SDOH) Interventions Food Insecurity Interventions: Intervention Not Indicated Financial Strain Interventions: Intervention Not Indicated Housing Interventions: Intervention Not Indicated Transportation Interventions: Intervention Not Indicated  Readmission Risk Interventions No flowsheet data found.  Aras Albarran, MSW, LCSW [phone] Heart Failure Social Worker

## 2022-01-02 NOTE — TOC CM/SW Note (Addendum)
.. °  Transition of Care [facility]) Screening Note   Patient Details  Name: Patricia Wagner Date of Birth: November 22, 1951   [facility]) CM/SW Contact:    Erenest Rasher, RN Phone Number: [phone] 01/02/2022, 11:47 AM    Transition of Care Department [facility]) has reviewed patient  We will continue to monitor patient advancement through interdisciplinary progression rounds. PT has recommended SNF, will work up for SNF placement.

## 2022-01-02 NOTE — Progress Notes (Signed)
[facility] Progress Note  Name: Patricia Wagner MRN: [id number] DOB: 1950/11/25  Chief Complaint:  Loose stools and malaise  Subjective:  she has had 365 mL UOP over 2/8.  She had 200 ml overnight per RN.  She doesn't wear oxygen at home and has been on 4 liters most recently.  Still hoping for improvement in labs - we discussed had gotten better yesterday am and then appeared to worsen c/w prior labs on recheck that afternoon.  Yesterday bicarb gtt was stopped and she was transitioned to NS which is now running at 75 ml/hr.   Review of systems:  Denies shortness of breath Denies n/v Denies chest pain Chronic foley   ----------- Background on consult:  71 y/o female with a PMH chronic systolic CHF, A-fib not on Eliquis due to history of diverticular GI bleeding, PVCs, CKD stage IIIb, chronic systolic CHF due to nonischemic cardiomyopathy (EF 35 to 40%), rheumatoid arthritis, Raynaud's disease, CVA, COPD, hypertension, hyperlipidemia. She was evaluated by her PCP 5 days prior to presentation for diarrhea and had a near syncopal event in the lobby. She was found to be hypoxic at the office and EMS was called, but she declined. She presented to the ED yesterday for slurred speech,and right arm weakness. She was hypoxic to the 70-80s and was placed on BiPAP after an ABG showed a pH of 7.1, PCO2 of 62, and a PO2 of 79. Pressures were soft and initial labs showed a leukocytosis of 12.9, thrombocytopenia with a platelet count of 98k, bicarb of 16, BUN of 128, sCr of 4.9 (BL of 1.3-1.5). U/A showing large amounts of leukocytes, >50 WBC, and many bacteria with negative nitrites.  Nephrology was consulted for her AKI on CKD stage IIIb.  On evaluation, patient is currently on BiPAP she is answering questions by shaking and nodding her head. She denies new medications. Per bedside nurse she had a "mucous" colored stool x1.    Intake/Output Summary (Last 24 hours) at 01/02/2022 0722 Last data  filed at 01/02/2022 0600 Gross per 24 hour  Intake 2824.24 ml  Output 465 ml  Net 2359.24 ml    Vitals:  Vitals:   01/02/22 0300 01/02/22 0400 01/02/22 0500 01/02/22 0600  BP: 102/76 (!) 86/50 (!) 71/42 92/65  Pulse: (!) 207  (!) 194   Resp: 14 (!) 24 16 20   Temp:  98 F (36.7 C)    TempSrc:  Oral    SpO2: 100% 100% 100% 100%  Weight:   100.1 kg   Height:         Physical Exam:  General adult female in bed in NAD  HEENT normocephalic atraumatic extraocular movements intact sclera anicteric Neck supple trachea midline Lungs clear to auscultation; unlabored at rest on 4 liters Heart S1S2 no rub Abdomen soft nontender obese habitus Extremities trace edema  Psych no anxiety or agitation  Neuro - awake and conversant; follows commands and provides a history Gu - foley in place  Medications reviewed   Labs:  BMP Latest Ref Rng & Units 01/02/2022 01/01/2022 01/01/2022  Glucose 70 - 99 mg/dL 110(H) 120(H) 67(L)  BUN 8 - 23 mg/dL 134(H) 133(H) 111(H)  Creatinine 0.44 - 1.00 mg/dL 5.24(H) 5.11(H) 3.82(H)  BUN/Creat Ratio 6 - 22 (calc) - - -  Sodium 135 - 145 mmol/L 136 138 136  Potassium 3.5 - 5.1 mmol/L 3.9 4.0 2.9(L)  Chloride 98 - 111 mmol/L 96(L) 96(L) 81(L)  CO2 22 - 32 mmol/L 25 26  44(H)  Calcium 8.9 - 10.3 mg/dL 8.4(L) 8.1(L) 5.9(LL)     Assessment/Plan:   # AKI - ATN from ischemic insults and multiple pre-renal insults in setting of diuretics/lasix, entresto, and jardiance which are now all on hold.  Renal US without hydro and kidneys normal echogenicity - Continue normal saline  - Start midodrine 10 mg TID - labs have now returned after my conversation with the patient.  There is no emergent indication for dialysis but should her urine output and clearance not improve she may ultimately require dialysis.     # Acute Hypercarbic resp failure  - transition to BIPAP as needed per primary team  - continue supplemental oxygen as needed    # Metabolic acidosis - setting  of AKI - s/p bicarb gtt    # Chronic combined systolic and diastolic CHF  - home lasix, entresto, and jardiance on hold   # Diarrhea - w/u per primary team  - on bicarb gtt as above    # CKD stage 3 - Cr baseline 1.3 - 1.7   # UTI  - abx per primary team  - on ceftriaxone  # Hyperphosphatemia  - improved last check   # Chronic urinary retention  - chronic foley catheter  - foley was exchanged here per nursing  Disposition - continue monitoring in ICU  Claudia Desanctis, MD 01/02/2022 7:32 AM

## 2022-01-02 NOTE — Procedures (Signed)
Central Venous Catheter Insertion Procedure Note  NASIRAH SACHS  [id number]  [birth year]  Date:01/02/22  Time:7:00 PM   Provider Performing:Sande Pickert Charleen Kirks   Procedure: Insertion of Non-tunneled Central Venous Catheter(36556)with US guidance (02774)    Indication(s) Hemodialysis  Consent Risks of the procedure as well as the alternatives and risks of each were explained to the patient and/or caregiver.  Consent for the procedure was obtained and is signed in the bedside chart  Anesthesia Topical only with 1% lidocaine   Timeout Verified patient identification, verified procedure, site/side was marked, verified correct patient position, special equipment/implants available, medications/allergies/relevant history reviewed, required imaging and test results available.  Sterile Technique Maximal sterile technique including full sterile barrier drape, hand hygiene, sterile gown, sterile gloves, mask, hair covering, sterile ultrasound probe cover (if used).  Procedure Description Area of catheter insertion was cleaned with chlorhexidine and draped in sterile fashion.   With real-time ultrasound guidance a HD catheter was placed into the right internal jugular vein.  Nonpulsatile blood flow and easy flushing noted in all ports.  The catheter was sutured in place and sterile dressing applied.  Complications/Tolerance None; patient tolerated the procedure well. Chest X-ray is ordered to verify placement for internal jugular or subclavian cannulation.    EBL Minimal  Specimen(s) None

## 2022-01-02 NOTE — Progress Notes (Signed)
Received protocol consult for US guided IV due to vasopressor order. Pt has Mina documented. Secure chat sent to RN to confirm if this line is double or triple lumen. Recommend using 3rd lumen for vasopressor. Awaiting response from RN.

## 2022-01-02 NOTE — Progress Notes (Signed)
Spoke to RN, Colletta [place], regarding IV access for vasopressor. Confirmed [place] is a TL and RN agrees with plan to use this site for vasopressor.

## 2022-01-02 NOTE — Evaluation (Signed)
Occupational Therapy Evaluation Patient Details Name: Patricia Wagner MRN: [id number] DOB: 10-26-1951 Today's Date: 01/02/2022   History of Present Illness Pt is a 71 y.o. female admitted 12/30/21 with SOB, AMS, slurred speech. Initially admitted as code stroke; head CT negative for acute finding; neurology suspects slurred speech related to hypoxia given abscence of other focal neurodeficits. Workup for metabolic encephalopathy, AKI on CKD, severe metabolic/respiratory acidosis requiring bicarbonate infusion and BiPAP, probable UTI. PMH includes CKD, CHF, RA, COPD, CVA, CAD, HTN, HLD, Raynaud's disease.   Clinical Impression   PTA, pt reports living alone and complete Independence in ADLs, IADLs, driving and mobility. Pt reports "I do it all". Pt presents now with deficits noted below with primary limitation today being cognition and self limiting behaviors. Pt with contradictory statements throughout and often tells therapist, "You don't understand". Pt adamant against any EOB/OOB attempts due to being "too weak" and declined therapist assist when offered. When OT attempted to exit room, pt then reported plan to sit EOB though demonstrated minimal initiation of this task - did this 2-3x. Hope to progress to OOB ADL assessments as pt agreeable. Rec SNF rehab at DC as pt likely unable to complete ADLs/IADLs without extensive physical assist at DC.       Recommendations for follow up therapy are one component of a multi-disciplinary discharge planning process, led by the attending physician.  Recommendations may be updated based on patient status, additional functional criteria and insurance authorization.   Follow Up Recommendations  Skilled nursing-short term rehab (<3 hours/day)    Assistance Recommended at Discharge Frequent or constant Supervision/Assistance  Patient can return home with the following A lot of help with walking and/or transfers;A lot of help with bathing/dressing/bathroom;Assistance  with cooking/housework;Direct supervision/assist for medications management;Direct supervision/assist for financial management;Assist for transportation;Help with stairs or ramp for entrance    Functional Status Assessment  Patient has had a recent decline in their functional status and demonstrates the ability to make significant improvements in function in a reasonable and predictable amount of time.  Equipment Recommendations  Other (comment) (TBD pending progress)    Recommendations for Other Services       Precautions / Restrictions Precautions Precautions: Fall;Other (comment) Precaution Comments: flexiseal; h/o expressive aphasia (per SLP); chronic foley cath; monitor sats (does not wear O2 at baseline) Restrictions Weight Bearing Restrictions: No      Mobility Bed Mobility Overal bed mobility: Needs Assistance             General bed mobility comments: Pt initially reports too weak to attempt bed mobility as she would "fall on the floor", OT offered help though pt repeats "you dont understand" and declined EOB attempts or even pulling self with bedrails. As OT attempted to leave room, pt demanding OT put down bedrail for pt to attempt sitting EOB though minimal effort to get to EOB - repeated this behavior 2-3x    Transfers                   General transfer comment: declined      Balance                                           ADL either performed or assessed with clinical judgement   ADL Overall ADL's : Needs assistance/impaired Eating/Feeding: Set up;Bed level   Grooming: Set up;Bed level   Upper  Body Bathing: Bed level;Maximal assistance   Lower Body Bathing: Total assistance;Bed level   Upper Body Dressing : Maximal assistance;Bed level   Lower Body Dressing: Total assistance;Bed level       Toileting- Clothing Manipulation and Hygiene: Total assistance Toileting - Clothing Manipulation Details (indicate cue type and  reason): flexiseal and chronic foley       General ADL Comments: Full ADL assessment limited by pt's self limiting behaviors and cognition.     Vision Baseline Vision/History: 1 Wears glasses Ability to See in Adequate Light: 2 Moderately impaired Patient Visual Report: No change from baseline Vision Assessment?: No apparent visual deficits     Perception     Praxis      Pertinent Vitals/Pain Pain Assessment Pain Assessment: Faces Faces Pain Scale: Hurts little more Pain Location: L shoulder Pain Descriptors / Indicators: Grimacing, Guarding, Moaning Pain Intervention(s): Monitored during session, Limited activity within patient's tolerance     Hand Dominance Right   Extremity/Trunk Assessment Upper Extremity Assessment Upper Extremity Assessment: LUE deficits/detail LUE Deficits / Details: impaired shoulder flexion, lifts <10* with grimacing with WB. Pt declined for OT to further assess range nor would demo self ROM to assist PROM abilities LUE Coordination: decreased gross motor   Lower Extremity Assessment Lower Extremity Assessment: Defer to PT evaluation   Cervical / Trunk Assessment Cervical / Trunk Assessment: Kyphotic   Communication Communication Communication: Expressive difficulties   Cognition Arousal/Alertness: Awake/alert Behavior During Therapy: Flat affect Overall Cognitive Status: No family/caregiver present to determine baseline cognitive functioning                                 General Comments: requires significant increased time to complete task, does not want to be physically assisted, easily distracted and very difficult to reason with. Pt with contradictory statements but often repeats to therapist "you dont understand"     General Comments  BP soft but stable, bed level    Exercises     Shoulder Instructions      Home Living Family/patient expects to be discharged to:: Private residence Living Arrangements:  Alone Available Help at Discharge: Family;Friend(s);Available PRN/intermittently Type of Home: Apartment Home Access: Level entry     Home Layout: One level     Bathroom Shower/Tub: Teacher, early years/pre: Handicapped height Bathroom Accessibility: Yes       Additional Comments: pt not forthcoming with details and seems frustrated by questions - therefore, PLOF info limited      Prior Functioning/Environment Prior Level of Function : Independent/Modified Independent;Driving;Patient poor historian/Family not available             Mobility Comments: Reports independent without DME; enjoys driving her car ADLs Comments: Reports having chronic foley catheter which she is indep managing; reports driving and grocery shopping, "I do it all"        OT Problem List: Decreased strength;Decreased range of motion;Decreased activity tolerance;Impaired balance (sitting and/or standing);Decreased cognition;Decreased safety awareness;Decreased knowledge of use of DME or AE;Increased edema;Pain;Obesity      OT Treatment/Interventions: Self-care/ADL training;Therapeutic exercise;Energy conservation;DME and/or AE instruction;Therapeutic activities;Patient/family education;Balance training    OT Goals(Current goals can be found in the care plan section) Acute Rehab OT Goals Patient Stated Goal: not clear d/t cognitive deficits OT Goal Formulation: With patient Time For Goal Achievement: 01/16/22 Potential to Achieve Goals: Good  OT Frequency: Min 2X/week    Co-evaluation  AM-PAC OT "6 Clicks" Daily Activity     Outcome Measure Help from another person eating meals?: A Little Help from another person taking care of personal grooming?: A Little Help from another person toileting, which includes using toliet, bedpan, or urinal?: Total Help from another person bathing (including washing, rinsing, drying)?: A Lot Help from another person to put on and taking  off regular upper body clothing?: A Lot Help from another person to put on and taking off regular lower body clothing?: Total 6 Click Score: 12   End of Session Equipment Utilized During Treatment: Oxygen Nurse Communication: Mobility status  Activity Tolerance: Other (comment) (limited  by cognition) Patient left: in bed;with call bell/phone within reach  OT Visit Diagnosis: Unsteadiness on feet (R26.81);Other abnormalities of gait and mobility (R26.89);Muscle weakness (generalized) (M62.81);Other symptoms and signs involving cognitive function                Time: 4360-6770 OT Time Calculation (min): 23 min Charges:  OT General Charges $OT Visit: 1 Visit OT Evaluation $OT Eval Moderate Complexity: 1 Mod  Malachy Chamber, OTR/L Acute Rehab Services Office: [phone]   Layla Maw 01/02/2022, 12:29 PM

## 2022-01-02 NOTE — Progress Notes (Signed)
Patient daughter, Patricia Wagner, updated by telephone. All questions and concerns answered. She will be available by telephone to give consent for CVC if needed . Will check labs this afternoon to monitor trend in kidney function.

## 2022-01-02 NOTE — Progress Notes (Signed)
OT Cancellation Note  Patient Details Name: Patricia Wagner MRN: [id number] DOB: 03-10-1951   Cancelled Treatment:    Reason Eval/Treat Not Completed: Other (comment) RN recommends checking back in PM for OT eval as pt may be more likely to participate at that time.   Layla Maw 01/02/2022, 7:12 AM

## 2022-01-02 NOTE — Progress Notes (Addendum)
NAME:  Patricia Wagner, MRN:  [id number], DOB:  [birth year], LOS: 2 ADMISSION DATE:  12/30/2021, CONSULTATION DATE:  12/29/2021 REFERRING MD:  [facility] CHIEF COMPLAINT:  Acute hypoxic/hypercapnic respiratory failure  History of Present Illness:  71 year old female who presents with increasing respiratory distress and has a well-documented past medical history of atrial fibrillation, chronic kidney disease, congestive heart failure, rheumatoid arthritis, Raynaud's disease, cardiovascular accident, COPD, urinary retention hypertension hyperlipidemia with known congestive heart failure.  She was trialed on noninvasive mechanical ventilatory support with no improvement of her pH of 7.1 therefore pulmonary critical care was called to the bedside.  She was started on a bicarbonate drip she will be transferred to the intensive care unit and has a low threshold for being intubated.  She may need a central line and/or hemodialysis catheter in the near future.  Pertinent  Medical History   Past Medical History:  Diagnosis Date   Acute blood loss anemia 03/05/2018   Acute hypercapnic respiratory failure ([place]) 04/30/2017   Acute renal failure superimposed on chronic kidney disease ([place]) 10/20/2016   Arthritis    hands, knees   Atrial fibrillation ([place])    a. s/p multiple cardioversions; failed tikosyn/sotalol.   Bradycardia 11/20/2016   CAD in native artery, s/p cardiac cath with non obstructive CAD 10/24/2016   Cerebrovascular accident (CVA) due to embolism of left cerebellar artery (HCC)    CHF (congestive heart failure) ([place])    Chronic anticoagulation 03/15/2018   Chronic atrial fibrillation ([place]) 8/41/6606   Chronic systolic (congestive) heart failure ([place]) 03/05/2018   CKD (chronic kidney disease) stage 3, GFR 30-59 ml/min (HCC)    Coagulopathy ([place]) 03/05/2018   DIVERTICULITIS, HX OF 07/25/2007   Diverticulosis of colon with hemorrhage 07/22/2007   Qualifier: Diagnosis of  By: [name]      DIVERTICULOSIS, COLON 07/22/2007   Dysuria 07/21/2017   Edema, peripheral    a. chronic BLE edema, R>L. Prior trauma from dog attack and accident.   Essential hypertension 07/22/2007   Qualifier: Diagnosis of  By: [name]     History of kidney stones    HLD (hyperlipidemia) 02/03/2008   Qualifier: Diagnosis of  By: [name], Normalee Sistare W    HYPERLIPIDEMIA 02/03/2008   Hypersomnia    declines w/u   Hypertension    Hypotension (arterial) 04/30/2017   MENOPAUSAL DISORDER 01/09/2011   Morbid obesity ([place]) 07/22/2007   NICM (nonischemic cardiomyopathy) ([place]) 10/24/2016   PAF (paroxysmal atrial fibrillation) ([place])    Presence of indwelling urinary catheter    supra pubic cath   Raynaud's syndrome 07/22/2007   Stroke ([place]) 2017   Suspected sleep apnea 05/20/2017   THYROID NODULE, RIGHT 01/04/2010   VITAMIN D DEFICIENCY 01/09/2011   Qualifier: Diagnosis of  By: [name], [place] Hospital Events: Including procedures, antibiotic start and stop dates in addition to other pertinent events   2/7 Admit to  ICU on BiPAP , Bicarbonate drip   Interim History / Subjective:  Hospital day #3 for this 71 year old who presented AKI on CKD stage IIIb from acute diarrheal illness and  mixed metabolic/respiratory acidosis. She was started on IV bicarbonate infusion and transferred to ICU after not responding to NIMV for respiratory acidosis.   Yesterday patient worked with PT. Evaluated by SLP. Did not work with OT. She refused BiPAP overnight.   She reports continuing to have frequent bowel movements and does not like the fecal management system. Unable to ambulate safely  to toilet and agreeable to [place].  Objective   Blood pressure 92/65, pulse (!) 194, temperature 98 F (36.7 C), temperature source Oral, resp. rate 20, height 5\' 4"  (1.626 m), weight 98.9 kg, SpO2 100 %.  Review: Tachycardic this AM, soft BP's, Afebrile   Intake/Output Summary (Last 24 hours) at 01/02/2022 4782 Last data  filed at 01/02/2022 0100 Gross per 24 hour  Intake 2791.32 ml  Output 215 ml  Net 2576.32 ml    Filed Weights   12/30/21 2229  Weight: 98.9 kg    Examination: General: NAD, on nasal canula , sitting up in bed HE: Normocephalic, atraumatic , EOMI, Conjunctivae normal ENT: No congestion, no rhinorrhea, no exudate or erythema  Cardiovascular: Normal rate, irregular rhythm.  No murmurs, rubs, or gallops , 1+ pitting edema in LE Pulmonary : Effort normal, breath sounds normal. No wheezes, rales, or rhonchi Abdominal: Obese, BS+ , Soft, NT Musculoskeletal: no swelling , ulnar deviation of MCP joint Skin: Warm, dry  Neuro: Alert and oriented to person, place, not to the month, answered January after long pause. Reminded it is Feb.   Urine output: 390 cc on 2/8, 690 cc 2/9 Stool: 100 ml with FMS Net: +4.3 L on admission Pressors: N/A Sedation: N/A Lines: Peripheral IVs Foley: Chronic Foley  WBC 9 > 11.8 H GB 9.9 > 13.3 Platelets 59 > 63  Chloride 96 BUN 134 Creatinine 5.24  Resolved Hospital Problem list   [place]  Assessment & Plan:  AKI on CKD stage IIIb in setting of acute diarrheal illness - Cr did not improve overnight with repletion of IV fluids. ATN from ischemic and nephrotoxic exposure in this setting. Home medications have been held, Lasix, Entresto , and Jardiance - No emergent need for HD at this time. - Appreciate nephrology consulting, midodrine added this morning.  - Continue NS @75ml /hr - Monitor I/O's - Midodrine 10 TID  Complicated UTI - cystitis complicated by chronic indwelling foley present on admission review of notes show first needed in in 2019.  - >100K colonies of gram negative rod, susceptibilities pending - Continue ceftriaxone, Day 2  Acute respiratory failure with hypoxia from acute on chronic combined systolic and diastolic heart failure - wean supplemental oxygen, maintain SPO2 > 92%  Thrombocytopenia  - In setting of acute illness,  improving. Hold Heparin.   Paroxysmal atrial fibrillation - sinus on Tele, PVC's - not on apixiban, hx of recurrent GI bleeds   Best Practice (right click and "Reselect all SmartList Selections" daily)   Diet/type: Regular consistency (see orders) DVT prophylaxis: other Holding in setting of thrombocytopenia GI prophylaxis: PPI Lines: N/A Foley:  Yes, and it is still needed Code Status:  full code Last date of multidisciplinary goals of care discussion [x]   Tamsen Snider, MD PGY3 Internal Medicine [phone]

## 2022-01-03 DIAGNOSIS — I5043 Acute on chronic combined systolic (congestive) and diastolic (congestive) heart failure: Secondary | ICD-10-CM | POA: Diagnosis not present

## 2022-01-03 LAB — URINE CULTURE: Culture: >=100000 — AB

## 2022-01-03 LAB — URINALYSIS, COMPLETE (UACMP) WITH MICROSCOPIC
Bilirubin Urine: NEGATIVE
Glucose, UA: NEGATIVE mg/dL
Ketones, ur: NEGATIVE mg/dL
Nitrite: NEGATIVE
Protein, ur: 30 mg/dL — AB
Specific Gravity, Urine: 1.011 (ref 1.005–1.030)
WBC, UA: >50 WBC/hpf — ABNORMAL HIGH (ref 0–5)
pH: 5 (ref 5.0–8.0)

## 2022-01-03 LAB — CBC WITH DIFFERENTIAL/PLATELET
Abs Immature Granulocytes: 0.07 10*3/uL (ref 0.00–0.07)
Basophils Absolute: 0 10*3/uL (ref 0.0–0.1)
Basophils Relative: 0 %
Eosinophils Absolute: 0 10*3/uL (ref 0.0–0.5)
Eosinophils Relative: 0 %
HCT: 37.9 % (ref 36.0–46.0)
Hemoglobin: 11.9 g/dL — ABNORMAL LOW (ref 12.0–15.0)
Immature Granulocytes: 1 %
Lymphocytes Relative: 3 %
Lymphs Abs: 0.3 10*3/uL — ABNORMAL LOW (ref 0.7–4.0)
MCH: 28.3 pg (ref 26.0–34.0)
MCHC: 31.4 g/dL (ref 30.0–36.0)
MCV: 90 fL (ref 80.0–100.0)
Monocytes Absolute: 1.1 10*3/uL — ABNORMAL HIGH (ref 0.1–1.0)
Monocytes Relative: 9 %
Neutro Abs: 10.2 10*3/uL — ABNORMAL HIGH (ref 1.7–7.7)
Neutrophils Relative %: 87 %
Platelets: 56 10*3/uL — ABNORMAL LOW (ref 150–400)
RBC: 4.21 MIL/uL (ref 3.87–5.11)
RDW: 14.9 % (ref 11.5–15.5)
WBC: 11.8 10*3/uL — ABNORMAL HIGH (ref 4.0–10.5)
nRBC: 0.7 % — ABNORMAL HIGH (ref 0.0–0.2)

## 2022-01-03 LAB — BASIC METABOLIC PANEL
Anion gap: 11 (ref 5–15)
Anion gap: 12 (ref 5–15)
BUN: 129 mg/dL — ABNORMAL HIGH (ref 8–23)
BUN: 128 mg/dL — ABNORMAL HIGH (ref 8–23)
CO2: 26 mmol/L (ref 22–32)
CO2: 25 mmol/L (ref 22–32)
Calcium: 8.2 mg/dL — ABNORMAL LOW (ref 8.9–10.3)
Calcium: 8.1 mg/dL — ABNORMAL LOW (ref 8.9–10.3)
Chloride: 98 mmol/L (ref 98–111)
Chloride: 98 mmol/L (ref 98–111)
Creatinine, Ser: 4.94 mg/dL — ABNORMAL HIGH (ref 0.44–1.00)
Creatinine, Ser: 4.49 mg/dL — ABNORMAL HIGH (ref 0.44–1.00)
GFR, Estimated: 9 mL/min — ABNORMAL LOW (ref >60)
GFR, Estimated: 10 mL/min — ABNORMAL LOW (ref >60)
Glucose, Bld: 102 mg/dL — ABNORMAL HIGH (ref 70–99)
Glucose, Bld: 117 mg/dL — ABNORMAL HIGH (ref 70–99)
Potassium: 3.6 mmol/L (ref 3.5–5.1)
Potassium: 3.7 mmol/L (ref 3.5–5.1)
Sodium: 135 mmol/L (ref 135–145)
Sodium: 135 mmol/L (ref 135–145)

## 2022-01-03 LAB — CBC
HCT: 36.9 % (ref 36.0–46.0)
Hemoglobin: 11.5 g/dL — ABNORMAL LOW (ref 12.0–15.0)
MCH: 28 pg (ref 26.0–34.0)
MCHC: 31.2 g/dL (ref 30.0–36.0)
MCV: 90 fL (ref 80.0–100.0)
Platelets: 53 10*3/uL — ABNORMAL LOW (ref 150–400)
RBC: 4.1 MIL/uL (ref 3.87–5.11)
RDW: 15 % (ref 11.5–15.5)
WBC: 10.4 10*3/uL (ref 4.0–10.5)
nRBC: 1 % — ABNORMAL HIGH (ref 0.0–0.2)

## 2022-01-03 LAB — MAGNESIUM: Magnesium: 2.7 mg/dL — ABNORMAL HIGH (ref 1.7–2.4)

## 2022-01-03 LAB — COOXEMETRY PANEL
Carboxyhemoglobin: 1.2 % (ref 0.5–1.5)
Methemoglobin: 1.1 % (ref 0.0–1.5)
O2 Saturation: 68.5 %
Total hemoglobin: 11.3 g/dL — ABNORMAL LOW (ref 12.0–16.0)

## 2022-01-03 LAB — PROTEIN / CREATININE RATIO, URINE
Creatinine, Urine: 125.6 mg/dL
Protein Creatinine Ratio: 0.39 mg/mg{Cre} — ABNORMAL HIGH (ref 0.00–0.15)
Total Protein, Urine: 49 mg/dL

## 2022-01-03 LAB — GLUCOSE, CAPILLARY: Glucose-Capillary: 103 mg/dL — ABNORMAL HIGH (ref 70–99)

## 2022-01-03 MED ORDER — AMIODARONE HCL IN DEXTROSE 360-4.14 MG/200ML-% IV SOLN
30.0000 mg/h | INTRAVENOUS | Status: DC
  Administered 2022-01-03 – 2022-01-06 (×7): 30 mg/h via INTRAVENOUS
  Filled 2022-01-03 (×7): qty 200

## 2022-01-03 MED ORDER — LOPERAMIDE HCL 2 MG PO CAPS
2.0000 mg | ORAL_CAPSULE | ORAL | Status: DC | PRN
  Administered 2022-01-04 – 2022-01-18 (×16): 2 mg via ORAL
  Filled 2022-01-03 (×19): qty 1

## 2022-01-03 NOTE — Progress Notes (Signed)
Physical Therapy Treatment Patient Details Name: Patricia Wagner MRN: [id number] DOB: May 07, 1951 Today's Date: 01/03/2022   History of Present Illness Pt is a 71 y.o. female admitted 12/30/21 with SOB, AMS, slurred speech. Initially admitted as code stroke; head CT negative for acute finding; neurology suspects slurred speech related to hypoxia given abscence of other focal neurodeficits. Workup for metabolic encephalopathy, AKI on CKD, severe metabolic/respiratory acidosis requiring bicarbonate infusion and BiPAP, probable UTI. PMH includes CKD, CHF, RA, COPD, CVA, CAD, HTN, HLD, Raynaud's disease.    PT Comments    Pt making slow progress with mobility. Was able to get to chair using the [facility] but required 2 person assist. Continue to recommend SNF at DC. Pt currently refusing SNF but she will have to progress significantly to be able to manage there.    Recommendations for follow up therapy are one component of a multi-disciplinary discharge planning process, led by the attending physician.  Recommendations may be updated based on patient status, additional functional criteria and insurance authorization.  Follow Up Recommendations  Skilled nursing-short term rehab (<3 hours/day) (Pt currently refusing)     Assistance Recommended at Discharge Frequent or constant Supervision/Assistance  Patient can return home with the following Two people to help with walking and/or transfers   Equipment Recommendations  Wheelchair (measurements PT);Wheelchair cushion (measurements PT)    Recommendations for Other Services       Precautions / Restrictions Precautions Precautions: Fall;Other (comment) Precaution Comments: flexiseal; h/o expressive aphasia (per SLP); chronic foley cath; monitor sats (does not wear O2 at baseline) Restrictions Weight Bearing Restrictions: No     Mobility  Bed Mobility Overal bed mobility: Needs Assistance Bed Mobility: Supine to Sit     Supine to sit: Mod assist,  HOB elevated     General bed mobility comments: Assist to bring legs off of bed, elevate trunk into sitting and bring hips to EOB.    Transfers Overall transfer level: Needs assistance Equipment used: Rolling walker (2 wheels), Ambulation equipment used Transfers: Sit to/from Stand, Bed to chair/wheelchair/BSC Sit to Stand: +2 physical assistance, Mod assist           General transfer comment: Initially attempted sit to stand with walker but pt unable to fully come to stand. Used Stedy and pt able to stand with assist to bring hips up Transfer via Lift Equipment: Stedy  Ambulation/Gait             Pre-gait activities: Stood x 1 minute on Stedy x 2 with min assist     Stairs             Wheelchair Mobility    Modified Rankin (Stroke Patients Only)       Balance Overall balance assessment: Needs assistance Sitting-balance support: No upper extremity supported, Feet supported Sitting balance-Leahy Scale: Fair     Standing balance support: Bilateral upper extremity supported, During functional activity Standing balance-Leahy Scale: Poor Standing balance comment: Stedy and min assist to maintain static standing                            Cognition Arousal/Alertness: Awake/alert Behavior During Therapy: Flat affect Overall Cognitive Status: No family/caregiver present to determine baseline cognitive functioning                                 General Comments: Poor insight into deficits and how they  relate to being able to care for herself at home.        Exercises      General Comments General comments (skin integrity, edema, etc.): VSS      Pertinent Vitals/Pain Pain Assessment Pain Assessment: Faces Faces Pain Scale: Hurts little more Pain Location: lt knee Pain Descriptors / Indicators: Grimacing, Guarding, Moaning Pain Intervention(s): Monitored during session    Home Living                           Prior Function            PT Goals (current goals can now be found in the care plan section) Progress towards PT goals: Progressing toward goals    Frequency    Min 3X/week      PT Plan Current plan remains appropriate    Co-evaluation              AM-PAC PT "6 Clicks" Mobility   Outcome Measure  Help needed turning from your back to your side while in a flat bed without using bedrails?: A Little Help needed moving from lying on your back to sitting on the side of a flat bed without using bedrails?: A Lot Help needed moving to and from a bed to a chair (including a wheelchair)?: Total Help needed standing up from a chair using your arms (e.g., wheelchair or bedside chair)?: Total Help needed to walk in hospital room?: Total Help needed climbing 3-5 steps with a railing? : Total 6 Click Score: 9    End of Session Equipment Utilized During Treatment: Gait belt Activity Tolerance: Patient tolerated treatment well Patient left: in chair;with call bell/phone within reach;with chair alarm set Nurse Communication: Mobility status;Need for lift equipment PT Visit Diagnosis: Other abnormalities of gait and mobility (R26.89);Muscle weakness (generalized) (M62.81)     Time: 4235-3614 PT Time Calculation (min) (ACUTE ONLY): 35 min  Charges:  $Therapeutic Activity: 23-37 mins                     [place] Pager [phone] Office [place] 01/03/2022, 9:47 AM

## 2022-01-03 NOTE — Progress Notes (Addendum)
Advanced Heart Failure Rounding Note  PCP-Cardiologist: Glori Bickers, MD   Subjective:     R IJ HD catheter placed 02/09  ? Co-ox 90% yesterday evening  On NE yesterday evening, weaned off by 11 pm  BP improving. 90s-low 100s this am. Scr 4.94 this am. Weight trending up.  Feels okay. No complaints of dyspnea or CP.  O2 stable on 3L [place]  Frequent PVCs 15-25/min on tele. SR.   Objective:    107.2 kg Body mass index is 40.57 kg/m.   Vital Signs:   Temp:  [97.9 F (36.6 C)-98.5 F (36.9 C)] 97.9 F (36.6 C) (02/10 0800) Pulse Rate:  [55-149] 75 (02/10 0800) Resp:  [12-24] 22 (02/10 0800) BP: (79-132)/(22-110) 107/22 (02/10 0800) SpO2:  [90 %-98 %] 92 % (02/10 0800) FiO2 (%):  [40 %] 40 % (02/09 1802) Weight:  [107.2 kg] 107.2 kg (02/10 0500) Last BM Date: 01/03/22  Weight change: Filed Weights   12/30/21 2229 01/02/22 0500 01/03/22 0500  Weight: 98.9 kg 100.1 kg 107.2 kg    Intake/Output:   Intake/Output Summary (Last 24 hours) at 01/03/2022 1007 Last data filed at 01/03/2022 0900 Gross per 24 hour  Intake 866.87 ml  Output 800 ml  Net 66.87 ml      Physical Exam    General:  Sitting up in chair. Appears weak HEENT: Normal Neck: Supple. JVP 10 cm. R IJ HD cath. Carotids 2+ bilat; no bruits.  Cor: PMI nondisplaced. Regular rate & rhythm witch ectopy. No rubs, gallops, 2/6 TR murmur Lungs: Clear Abdomen: Soft, nontender, nondistended. No hepatosplenomegaly. No bruits or masses. Good bowel sounds. Extremities: No cyanosis, clubbing, rash, 1-2 + edema Neuro: Fatigued but alert and oriented X 3.    Telemetry   SR 70s, 15-25 PVCs/min  Labs    CBC Recent Labs    01/02/22 0253 01/02/22 1755 01/03/22 0440  WBC 11.8*  --  10.4  HGB 13.3 13.6 11.5*  HCT 44.5 40.0 36.9  MCV 92.1  --  90.0  PLT 63*  --  53*   Basic Metabolic Panel Recent Labs    12/31/21 1610 01/01/22 0643 01/01/22 1449 01/02/22 1704 01/02/22 1755 01/03/22 0440   NA 137 136   < > 135 134* 135  K 4.6 2.9*   < > 3.8 3.7 3.6  CL 102 81*   < > 97*  --  98  CO2 21* 44*   < > 26  --  26  GLUCOSE 76 67*   < > 126*  --  102*  BUN 130* 111*   < > 129*  --  129*  CREATININE 5.03* 3.82*   < > 5.05*  --  4.94*  CALCIUM 8.2* 5.9*   < > 8.1*  --  8.2*  MG 2.8* 2.1  --   --   --   --   PHOS 7.3* 5.0*  --   --   --   --    < > = values in this interval not displayed.   Liver Function Tests No results for input(s): AST, ALT, ALKPHOS, BILITOT, PROT, ALBUMIN in the last 72 hours. No results for input(s): LIPASE, AMYLASE in the last 72 hours. Cardiac Enzymes No results for input(s): CKTOTAL, CKMB, CKMBINDEX, TROPONINI in the last 72 hours.  BNP: BNP (last 3 results) Recent Labs    12/30/21 2342  BNP >4,500.0*    ProBNP (last 3 results) No results for input(s): PROBNP in the last 8760  hours.   D-Dimer No results for input(s): DDIMER in the last 72 hours. Hemoglobin A1C No results for input(s): HGBA1C in the last 72 hours. Fasting Lipid Panel No results for input(s): CHOL, HDL, LDLCALC, TRIG, CHOLHDL, LDLDIRECT in the last 72 hours. Thyroid Function Tests No results for input(s): TSH, T4TOTAL, T3FREE, THYROIDAB in the last 72 hours.  Invalid input(s): FREET3  Other results:   Imaging    DG CHEST PORT 1 VIEW  Result Date: 01/02/2022 CLINICAL DATA:  Acute hemodialysis EXAM: PORTABLE CHEST 1 VIEW COMPARISON:  12/30/2021 FINDINGS: Cardiomegaly. No frank interstitial edema. No pleural effusion or pneumothorax. Right IJ dual lumen dialysis catheter terminates in the lower SVC. Degenerative changes of the thoracic spine. IMPRESSION: Cardiomegaly.  No frank interstitial edema. Electronically Signed   By: Julian Hy M.D.   On: 01/02/2022 19:11     Medications:     Scheduled Medications:  chlorhexidine  15 mL Mouth Rinse BID   Chlorhexidine Gluconate Cloth  6 each Topical Daily   heparin  5,000 Units Subcutaneous Q8H   mouth rinse  15 mL  Mouth Rinse q12n4p   midodrine  10 mg Oral TID WC   pantoprazole  40 mg Oral Daily   sodium chloride flush  10-40 mL Intracatheter Q12H    Infusions:  sodium chloride 10 mL/hr at 01/03/22 0900   sodium chloride     cefTRIAXone (ROCEPHIN)  IV Stopped (01/02/22 1347)   norepinephrine (LEVOPHED) Adult infusion Stopped (01/02/22 2300)    PRN Medications: sodium chloride, acetaminophen **OR** acetaminophen, heparin, sodium chloride flush    Patient Profile   71 y/o female w/ chronic biventricular heart failure/ nonischemic CM, felt likely tachymediated from Afib/PVCs, Stage IIIb CKD and neurogenic bladder w/ urinary retention w/ chronic foley, admitted w/ acute hypoxic respiratory failure and AKI, in setting of UTI and a/c CHF.   Assessment/Plan   1. Acute on Chronic Biventricular Heart Failure, w/ Predominant RV Failure - NICM, LHC 2017 w/ mild nonobstructive CAD - Echo 6/18 EF 15% in setting of tachy CM from AF - Echo 02/2018: EF 40-45%, grade 1 DD, RV normal - Echo 8/20 45-50% Personally reviewed - Echo 9/22 EF read as 35-40% (I think closer to 30%) - suspect tachymediated CM from Afib and high PVC burden  - Previous Amyloid w/u negative with negative fat pad biopsy and PYP scan (2018). May need to consider repeat PYP to reassess for TTR amyloid given predominant RV dysfunction, progressive renal failure and autonomic dysfunction - Echo this admit, EF 40-45%, RV severely enlarged w/ moderately reduced systolic function  - Now w/ a/c CHF c/b AKI on Stage lllb CKD  - Co-ox 90% yesterday. Recheck Co-ox this am. Set up CVP monitoring. IV fluids stopped.  - May need DBA for RV support. - Unable to tolerate GDMT w/ hypotension and CKD - Continue midodrine 10 tid for BP support, titrate further if needed  - Place Unna boots    2. AKI on CKD Stage IIIb - Baseline SCr 1.3-1.5 - 4.95 on admit. Little improvement despite IVF hydration, SCr 5.24 yesterday, down to 4.94 today. BUN 129.  -  nephrology following, felt to be ATN. May eventually need CRRT - given degree of RV failure need to assess hemodynamics to r/o low output HF as contributing factor.    3. Acute Hypoxic Respiratory Failure - O2 sats 70-80s on admit, requiring BiPAP  - Flu and Covid negative  - admit CXR showed vascular congestion but diuretics held on admit  due to suspected hypovolemic AKI in setting of diarrhea  - now on 3L - May end up needing CVVHD for volume removal    4. UTI  - chronic foley for neurogenic bladder w/ urinary retention  - UA + on admit - UCx + Klebsiella Pneumoniae + Serratia Marcescens  - Treating w/ ceftriaxone (sensitive on susceptibilities) - AF, WBC 9.0>>11.8 >>10.4   5. Tricuspid Regurgitation  - trivial TR on echo 9/22 - severe on echo this admit - likely functional, RV severely enlarged    6. PVCs/ NSVT - frequent, 15-25 PVCs per min on tele  - Zio 7/22 showed 7.0% burden, started on mexiletine (held on admit) - start amiodarone gtt at 30/hr   7. NSTEMI  - Hs trop 5,916>>5,616>>5,916 - in the setting of acute renal failure  - Denies CP, EKG nonischemic. LHC 2017 minimal CAD - No plans for LHC    8. PAF - NSR w/ PVCs - if placed on inotropic support, will need amio gtt - not on anticoagulation given h/o recurrent GIBs    9. Thrombocytopenia - Plt 98 on admit, down to 53 today  - off heparin, but doubt HIT given plts were low at baseline - suspect 2/2 critical illness  - hgb 11.5, monitor for bleeding   Length of Stay: 3  FINCH, LINDSAY N, PA-C  01/03/2022, 10:07 AM  Advanced Heart Failure Team Pager [phone] (M-F; 7a - 5p)  Please contact [facility] for night-coverage after hours (5p -7a ) and weekends on [email or web address]  Agree with above.  Temporary HD cath placed yesterday. Scr 5.0 -> 4.9. Made about 600-700 cc urine today. Mental status improved. Co-ox 68% CVP 12 (checked personally)   PLTs down. No bleeding Having frequent PVCs.   General:   Sitting in chair No resp difficulty HEENT: normal Neck: supple. RIJ HD cath. CVP 12 Carotids 2+ bilat; no bruits. No lymphadenopathy or thryomegaly appreciated. Cor: PMI nondisplaced. Regular rate & rhythm. No rubs, gallops or murmurs. Lungs: clear Abdomen: soft, nontender, nondistended. No hepatosplenomegaly. No bruits or masses. Good bowel sounds. Extremities: no cyanosis, clubbing, rash, 2+ edema Neuro: alert & orientedx3, cranial nerves grossly intact. moves all 4 extremities w/o difficulty. Affect pleasant  Renal function seems to be recovering slowly. Now volume overloaded but respiratory status ok. Mental status clearing. Co-ox ok -> no need for inotropes at this point.   Agree with short-term amio for PVCs  D/w CCM team   AHF team will see again Monday unless called over the weekend.   CRITICAL CARE Performed by: Glori Bickers  Total critical care time: 35 minutes  Critical care time was exclusive of separately billable procedures and treating other patients.  Critical care was necessary to treat or prevent imminent or life-threatening deterioration.  Critical care was time spent personally by me (independent of midlevel providers or residents) on the following activities: development of treatment plan with patient and/or surrogate as well as nursing, discussions with consultants, evaluation of patient's response to treatment, examination of patient, obtaining history from patient or surrogate, ordering and performing treatments and interventions, ordering and review of laboratory studies, ordering and review of radiographic studies, pulse oximetry and re-evaluation of patient's condition.  Glori Bickers, MD  8:10 PM

## 2022-01-03 NOTE — Progress Notes (Signed)
Left message for ortho tech at [phone] to apply una boots Also paged at [phone]

## 2022-01-03 NOTE — Progress Notes (Signed)
Orthopedic Tech Progress Note Patient Details:  Patricia Wagner [birth year] [id number]  Ortho Devices Type of Ortho Device: Haematologist Ortho Device/Splint Location: BLE Ortho Device/Splint Interventions: Application, Ordered   Post Interventions Patient Tolerated: Well  [name] 01/03/2022, 4:28 PM

## 2022-01-03 NOTE — Consult Note (Signed)
° °  [facility] Inpatient Consult   01/03/2022  Patricia Wagner [birth year] [id number]  [place] Organization [ACO] Patient: [name]  Primary Care Provider:  Janith Lima, MD, [facility], this is an Embedded provider with a Chronic Care Management program and team.  Acknowledgement of hospital admission as patient is currently at ICU level of care/unit.  Patient is currently active with [facility] for chronic disease management services.  Patient has been engaged by a [place] [facility].  Our community based plan of care has focused on disease management and community resource support.    Record reviewed briefly for planned disposition and spoke with HF RNCM regarding post hospital needs.  She states patient has been refusing SNF rehab.  Reviewed PT recommendations and patient's progress and patient's need for 24/7 care currently.  Plan: Updated the [place] that this patient has an Embedded provider with an Embedded Chronic Care Management team.  Made Inpatient Transition Of Care [TOC] team member to make aware that [facility] following.   Of note, [facility] Care Management services does not replace or interfere with any services that are needed or arranged by inpatient [facility] care management team.    For additional questions or referrals please contact:  Natividad Brood, RN BSN [facility] Liaison  [phone] business mobile phone Toll free office [phone]  Fax number: [phone] [email or web address]

## 2022-01-03 NOTE — Progress Notes (Signed)
[facility] Progress Note  Name: Patricia Wagner MRN: [id number] DOB: 1951-02-06  Chief Complaint:  Loose stools and malaise  Subjective:  she had 650 mL UOP over 2/9.  Per the RN the patient had a dialysis catheter placed yesterday - this was by afternoon/evening team and they were placing a line so chose to place one that would work for dialysis.  She was on levo yesterday evening at 3 mcg/min and per RN this was weaned to off by 11pm.  She was sleepy and not able to take her midodrine.  Fluids were stopped per heart failure team.   Review of systems:   Denies shortness of breath Denies n/v Denies chest pain Chronic foley  No oxygen at home  ----------- Background on consult:  71 y/o female with a PMH chronic systolic CHF, A-fib not on Eliquis due to history of diverticular GI bleeding, PVCs, CKD stage IIIb, chronic systolic CHF due to nonischemic cardiomyopathy (EF 35 to 40%), rheumatoid arthritis, Raynaud's disease, CVA, COPD, hypertension, hyperlipidemia. She was evaluated by her PCP 5 days prior to presentation for diarrhea and had a near syncopal event in the lobby. She was found to be hypoxic at the office and EMS was called, but she declined. She presented to the ED yesterday for slurred speech,and right arm weakness. She was hypoxic to the 70-80s and was placed on BiPAP after an ABG showed a pH of 7.1, PCO2 of 62, and a PO2 of 79. Pressures were soft and initial labs showed a leukocytosis of 12.9, thrombocytopenia with a platelet count of 98k, bicarb of 16, BUN of 128, sCr of 4.9 (BL of 1.3-1.5). U/A showing large amounts of leukocytes, >50 WBC, and many bacteria with negative nitrites.  Nephrology was consulted for her AKI on CKD stage IIIb.  On evaluation, patient is currently on BiPAP she is answering questions by shaking and nodding her head. She denies new medications. Per bedside nurse she had a "mucous" colored stool x1.    Intake/Output Summary (Last 24 hours) at  01/03/2022 8502 Last data filed at 01/03/2022 7741 Gross per 24 hour  Intake 894.87 ml  Output 850 ml  Net 44.87 ml    Vitals:  Vitals:   01/03/22 0430 01/03/22 0500 01/03/22 0530 01/03/22 0600  BP: (!) 98/57 (!) 103/56 (!) 93/54 108/60  Pulse:      Resp: 18 14 15 15   Temp:      TempSrc:      SpO2:      Weight:  107.2 kg    Height:         Physical Exam:    General adult female in bed in NAD  HEENT normocephalic atraumatic extraocular movements intact sclera anicteric Neck supple trachea midline Lungs clear to auscultation; unlabored at rest on 4 liters Heart S1S2 no rub Abdomen soft nontender obese habitus Extremities trace edema  Psych no anxiety or agitation  Neuro - awake and conversant; follows commands and provides a history Gu - foley in place  Medications reviewed   Labs:  BMP Latest Ref Rng & Units 01/03/2022 01/02/2022 01/02/2022  Glucose 70 - 99 mg/dL 102(H) - 126(H)  BUN 8 - 23 mg/dL 129(H) - 129(H)  Creatinine 0.44 - 1.00 mg/dL 4.94(H) - 5.05(H)  BUN/Creat Ratio 6 - 22 (calc) - - -  Sodium 135 - 145 mmol/L 135 134(L) 135  Potassium 3.5 - 5.1 mmol/L 3.6 3.7 3.8  Chloride 98 - 111 mmol/L 98 - 97(L)  CO2 22 -  32 mmol/L 26 - 26  Calcium 8.9 - 10.3 mg/dL 8.2(L) - 8.1(L)     Assessment/Plan:   # AKI - ATN from ischemic insults and multiple pre-renal insults in setting of diuretics/lasix, entresto, and jardiance which are now all on hold.  She had had multiple AKI events in the past.  Renal US without hydro and kidneys normal echogenicity. R/o GN as below - continue midodrine 10 mg TID - check UP/cr ratio - GN w/u as below  - UOP improving.  No emergent indication for dialysis.  Should her clearance not improve she may ultimately require dialysis/RRT however I am hopeful with improving pressures and improving urine output that her clearance will follow     # Acute Hypercarbic resp failure  - transition to BIPAP as needed per primary team  - continue  supplemental oxygen as needed    # Metabolic acidosis - setting of AKI - s/p bicarb gtt    # Chronic combined systolic and diastolic CHF  - home lasix, entresto, and jardiance on hold   # Hematuria  - Repeat UA  - check up/cr ratio as above - check complement, ANA, ANCA, anti-GBM  # Diarrhea - w/u per primary team  - s/p bicarb gtt as above    # CKD stage 3 - Cr baseline 1.3 - 1.7   # UTI  - abx per primary team  - on ceftriaxone  # Hyperphosphatemia  - improved last check   # Chronic urinary retention  - chronic foley catheter  - foley was exchanged here per nursing  Disposition - continue monitoring in ICU  Claudia Desanctis, MD 01/03/2022 7:25 AM

## 2022-01-03 NOTE — Progress Notes (Signed)
NAME:  Patricia Wagner, MRN:  [id number], DOB:  Dec 16, 1950, LOS: 3 ADMISSION DATE:  12/30/2021, CONSULTATION DATE:  12/29/2021 REFERRING MD:  [facility] CHIEF COMPLAINT:  Acute hypoxic/hypercapnic respiratory failure  History of Present Illness:  71 year old female who presents with increasing respiratory distress and has a well-documented past medical history of atrial fibrillation, chronic kidney disease, congestive heart failure, rheumatoid arthritis, Raynaud's disease, cardiovascular accident, COPD, urinary retention hypertension hyperlipidemia with known congestive heart failure.  She was trialed on noninvasive mechanical ventilatory support with no improvement of her pH of 7.1 therefore pulmonary critical care was called to the bedside.  She was started on a bicarbonate drip she will be transferred to the intensive care unit and has a low threshold for being intubated.  She may need a central line and/or hemodialysis catheter in the near future.  Pertinent  Medical History   Past Medical History:  Diagnosis Date   Acute blood loss anemia 03/05/2018   Acute hypercapnic respiratory failure ([place]) 04/30/2017   Acute renal failure superimposed on chronic kidney disease ([place]) 10/20/2016   Arthritis    hands, knees   Atrial fibrillation ([place])    a. s/p multiple cardioversions; failed tikosyn/sotalol.   Bradycardia 11/20/2016   CAD in native artery, s/p cardiac cath with non obstructive CAD 10/24/2016   Cerebrovascular accident (CVA) due to embolism of left cerebellar artery (HCC)    CHF (congestive heart failure) ([place])    Chronic anticoagulation 03/15/2018   Chronic atrial fibrillation ([place]) 3/66/4403   Chronic systolic (congestive) heart failure ([place]) 03/05/2018   CKD (chronic kidney disease) stage 3, GFR 30-59 ml/min (HCC)    Coagulopathy ([place]) 03/05/2018   DIVERTICULITIS, HX OF 07/25/2007   Diverticulosis of colon with hemorrhage 07/22/2007   Qualifier: Diagnosis of  By: [name]      DIVERTICULOSIS, COLON 07/22/2007   Dysuria 07/21/2017   Edema, peripheral    a. chronic BLE edema, R>L. Prior trauma from dog attack and accident.   Essential hypertension 07/22/2007   Qualifier: Diagnosis of  By: [name]     History of kidney stones    HLD (hyperlipidemia) 02/03/2008   Qualifier: Diagnosis of  By: [name], Britanni Yarde W    HYPERLIPIDEMIA 02/03/2008   Hypersomnia    declines w/u   Hypertension    Hypotension (arterial) 04/30/2017   MENOPAUSAL DISORDER 01/09/2011   Morbid obesity ([place]) 07/22/2007   NICM (nonischemic cardiomyopathy) ([place]) 10/24/2016   PAF (paroxysmal atrial fibrillation) ([place])    Presence of indwelling urinary catheter    supra pubic cath   Raynaud's syndrome 07/22/2007   Stroke ([place]) 2017   Suspected sleep apnea 05/20/2017   THYROID NODULE, RIGHT 01/04/2010   VITAMIN D DEFICIENCY 01/09/2011   Qualifier: Diagnosis of  By: [name], [place] [facility] Events: Including procedures, antibiotic start and stop dates in addition to other pertinent events   2/7 Admit to  ICU on BiPAP , Bicarbonate drip 2/8 Started on Ceftriaxone for complicated UTI, chronic foley , present on admission 2/9 BUN 134, RIJ HD Cath placed  Interim History / Subjective:  Hospital day #4 for this 71 year old who presented AKI on CKD stage IIIb from acute diarrheal illness and  mixed metabolic/respiratory acidosis. She was started on IV bicarbonate infusion and transferred to ICU after not responding to NIMV for respiratory acidosis.   Overnight RIJ HD cath placed.   Objective   Blood pressure (!) 93/54, pulse 78, temperature 98.5 F (36.9  C), temperature source Axillary, resp. rate 15, height 5\' 4"  (1.626 m), weight 100.1 kg, SpO2 95 %.  Review: Tachycardic this AM, soft BP's, Afebrile   Intake/Output Summary (Last 24 hours) at 01/03/2022 0559 Last data filed at 01/03/2022 0500 Gross per 24 hour  Intake 950.23 ml  Output 980 ml  Net -29.77 ml    Filed Weights    12/30/21 2229 01/02/22 0500  Weight: 98.9 kg 100.1 kg    Examination: General: NAD, on nasal canula  HE: Normocephalic, atraumatic , EOMI, Conjunctivae normal ENT: RIJ Cath, trachea midline  Cardiovascular: Normal rate, regular rhythm.  No murmurs, rubs, or gallops, trace LE edema Pulmonary : Effort normal at rest, on 4 L [place], breath sounds normal. No wheezes, rales, or rhonchi Abdominal: soft, nontender,  bowel sounds present Musculoskeletal: chronic RA changes in hands   Urine output: 760 ml out yesterday, day before 315 ml Stool: 200 ml  Net: + 4.3 on admission (107.2 Kg, 97.7 kg at a office visit in November) Pressors: On midodrine, off levo, required temporarily after procedure Sedation: n/a Lines: RIJ HD Foley: chronic ,exchanged 2/8   WBC 11.8 > 10.4 HGB 9.9 > 13.6 > 11.5 Platelets 59 > 63 > 53  Chloride 96 BUN 134 Creatinine 5.24   Cr 5.05 > 4.94 , GFR remains 9 BUN 134 > 129 K 3.6 CO2 26  Urine Culture 2/07: Klebsiella Pneumonia Serratia Marcescens  Resolved Hospital Problem list   [place]  Assessment & Plan:  AKI on CKD stage IIIb in setting of diarrheal illness - likely a viral gastroenteritis  - GFR remains the same. Urine output is trending in right direction, slight improvement in BUN. Hopeful this represents improvement.  - CVC HD line placed incase of need for HD and monitoring of CVP - Continue midodrine 10  - Nephrology following, ordered workup for GN, follow results - Continue holding nephrotoxic medications - Monitor I/O's  Complicated Klebsiella Pneumoniae and Serratia Marcescens UTI - cystitis complicated by chronic indwelling foley present on admission review of notes show first needed in in 2019.  - Sensitive to Ceftriaxone  - Continue Ceftriaxone , day 3 of 7.   Acute respiratory failure with hypoxia from acute on chronic combined systolic and diastolic heart failure - Coox 90.6 - on BiPAP overnight, on 3L this morning Sat 93%,  normal work of breathing - Wean supplemental oxygen to maintain Sat > 92% - Continue holding lasix, Entresto, and Jardiance   Thrombocytopenia - Platelets 53 this morning. Low HIT score. Likely secondary to acute illness.  - Continue SQ Heparin for now.   Hx Paroxysmal atrial fibrillation PVC's / NSVT - not on apixiban, hx of recurrent GI bleeds - K - NSR, frequent PVC's - Amio IV infusion started today   Best Practice (right click and "Reselect all SmartList Selections" daily)   Diet/type: Regular consistency (see orders) DVT prophylaxis: prophylactic heparin   GI prophylaxis: PPI Lines: Central line Foley:  Yes, and it is still needed Code Status:  full code Last date of multidisciplinary goals of care discussion [Spoke to daughter and gave updates on 2/10]  Tamsen Snider, MD PGY3 Internal Medicine [phone]

## 2022-01-04 DIAGNOSIS — N179 Acute kidney failure, unspecified: Secondary | ICD-10-CM | POA: Diagnosis not present

## 2022-01-04 DIAGNOSIS — I2510 Atherosclerotic heart disease of native coronary artery without angina pectoris: Secondary | ICD-10-CM

## 2022-01-04 DIAGNOSIS — I480 Paroxysmal atrial fibrillation: Secondary | ICD-10-CM

## 2022-01-04 DIAGNOSIS — I5043 Acute on chronic combined systolic (congestive) and diastolic (congestive) heart failure: Secondary | ICD-10-CM | POA: Diagnosis not present

## 2022-01-04 DIAGNOSIS — D696 Thrombocytopenia, unspecified: Secondary | ICD-10-CM

## 2022-01-04 DIAGNOSIS — N170 Acute kidney failure with tubular necrosis: Secondary | ICD-10-CM | POA: Diagnosis not present

## 2022-01-04 LAB — CBC
HCT: 37.1 % (ref 36.0–46.0)
Hemoglobin: 11.6 g/dL — ABNORMAL LOW (ref 12.0–15.0)
MCH: 28.2 pg (ref 26.0–34.0)
MCHC: 31.3 g/dL (ref 30.0–36.0)
MCV: 90.3 fL (ref 80.0–100.0)
Platelets: 53 10*3/uL — ABNORMAL LOW (ref 150–400)
RBC: 4.11 MIL/uL (ref 3.87–5.11)
RDW: 15 % (ref 11.5–15.5)
WBC: 11.3 10*3/uL — ABNORMAL HIGH (ref 4.0–10.5)
nRBC: 0.5 % — ABNORMAL HIGH (ref 0.0–0.2)

## 2022-01-04 LAB — C3 COMPLEMENT: C3 Complement: 116 mg/dL (ref 82–167)

## 2022-01-04 LAB — BASIC METABOLIC PANEL
Anion gap: 13 (ref 5–15)
BUN: 132 mg/dL — ABNORMAL HIGH (ref 8–23)
CO2: 26 mmol/L (ref 22–32)
Calcium: 8.1 mg/dL — ABNORMAL LOW (ref 8.9–10.3)
Chloride: 96 mmol/L — ABNORMAL LOW (ref 98–111)
Creatinine, Ser: 4.49 mg/dL — ABNORMAL HIGH (ref 0.44–1.00)
GFR, Estimated: 10 mL/min — ABNORMAL LOW (ref >60)
Glucose, Bld: 118 mg/dL — ABNORMAL HIGH (ref 70–99)
Potassium: 3.6 mmol/L (ref 3.5–5.1)
Sodium: 135 mmol/L (ref 135–145)

## 2022-01-04 LAB — COOXEMETRY PANEL
Carboxyhemoglobin: 1.3 % (ref 0.5–1.5)
Methemoglobin: 1.5 % (ref 0.0–1.5)
O2 Saturation: 80.3 %
Total hemoglobin: 11.8 g/dL — ABNORMAL LOW (ref 12.0–16.0)

## 2022-01-04 LAB — ANA: Anti Nuclear Antibody (ANA): NEGATIVE

## 2022-01-04 LAB — GLOMERULAR BASEMENT MEMBRANE ANTIBODIES: GBM Ab: <0.2 units (ref 0.0–0.9)

## 2022-01-04 LAB — C4 COMPLEMENT: Complement C4, Body Fluid: 31 mg/dL (ref 12–38)

## 2022-01-04 MED ORDER — FUROSEMIDE 10 MG/ML IJ SOLN
80.0000 mg | Freq: Once | INTRAMUSCULAR | Status: AC
Start: 2022-01-04 — End: 2022-01-04
  Administered 2022-01-04: 80 mg via INTRAVENOUS
  Filled 2022-01-04: qty 8

## 2022-01-04 MED ORDER — POTASSIUM CHLORIDE CRYS ER 20 MEQ PO TBCR
20.0000 meq | EXTENDED_RELEASE_TABLET | Freq: Once | ORAL | Status: AC
  Administered 2022-01-04: 20 meq via ORAL
  Filled 2022-01-04: qty 1

## 2022-01-04 NOTE — Progress Notes (Signed)
NAME:  Patricia Wagner, MRN:  [id number], DOB:  [birth year], LOS: 4 ADMISSION DATE:  12/30/2021, CONSULTATION DATE:  12/29/2021 REFERRING MD:  [facility] CHIEF COMPLAINT:  Acute hypoxic/hypercapnic respiratory failure  History of Present Illness:  71 year old female who presents with increasing respiratory distress and has a well-documented past medical history of atrial fibrillation, chronic kidney disease, congestive heart failure, rheumatoid arthritis, Raynaud's disease, cardiovascular accident, COPD, urinary retention hypertension hyperlipidemia with known congestive heart failure.  She was trialed on noninvasive mechanical ventilatory support with no improvement of her pH of 7.1 therefore pulmonary critical care was called to the bedside.  She was started on a bicarbonate drip she will be transferred to the intensive care unit and has a low threshold for being intubated.  She may need a central line and/or hemodialysis catheter in the near future.  Pertinent  Medical History   Past Medical History:  Diagnosis Date   Acute blood loss anemia 03/05/2018   Acute hypercapnic respiratory failure ([place]) 04/30/2017   Acute renal failure superimposed on chronic kidney disease ([place]) 10/20/2016   Arthritis    hands, knees   Atrial fibrillation ([place])    a. s/p multiple cardioversions; failed tikosyn/sotalol.   Bradycardia 11/20/2016   CAD in native artery, s/p cardiac cath with non obstructive CAD 10/24/2016   Cerebrovascular accident (CVA) due to embolism of left cerebellar artery (HCC)    CHF (congestive heart failure) ([place])    Chronic anticoagulation 03/15/2018   Chronic atrial fibrillation ([place]) 05/19/9484   Chronic systolic (congestive) heart failure ([place]) 03/05/2018   CKD (chronic kidney disease) stage 3, GFR 30-59 ml/min (HCC)    Coagulopathy ([place]) 03/05/2018   DIVERTICULITIS, HX OF 07/25/2007   Diverticulosis of colon with hemorrhage 07/22/2007   Qualifier: Diagnosis of  By: [name]      DIVERTICULOSIS, COLON 07/22/2007   Dysuria 07/21/2017   Edema, peripheral    a. chronic BLE edema, R>L. Prior trauma from dog attack and accident.   Essential hypertension 07/22/2007   Qualifier: Diagnosis of  By: [name]     History of kidney stones    HLD (hyperlipidemia) 02/03/2008   Qualifier: Diagnosis of  By: [name], James W    HYPERLIPIDEMIA 02/03/2008   Hypersomnia    declines w/u   Hypertension    Hypotension (arterial) 04/30/2017   MENOPAUSAL DISORDER 01/09/2011   Morbid obesity ([place]) 07/22/2007   NICM (nonischemic cardiomyopathy) ([place]) 10/24/2016   PAF (paroxysmal atrial fibrillation) ([place])    Presence of indwelling urinary catheter    supra pubic cath   Raynaud's syndrome 07/22/2007   Stroke ([place]) 2017   Suspected sleep apnea 05/20/2017   THYROID NODULE, RIGHT 01/04/2010   VITAMIN D DEFICIENCY 01/09/2011   Qualifier: Diagnosis of  By: [name], [place] Events: Including procedures, antibiotic start and stop dates in addition to other pertinent events   2/7 Admit to  ICU on BiPAP , Bicarbonate drip 2/8 Started on Ceftriaxone for complicated UTI, chronic foley , present on admission 2/9 BUN 134, RIJ HD Cath placed  Interim History / Subjective:  No overnight issues. Responded to lasix with good uop.   Objective   Blood pressure (!) 118/55, pulse 62, temperature 98.2 F (36.8 C), temperature source Axillary, resp. rate 15, height 5\' 4"  (1.626 m), weight 111.1 kg, SpO2 94 %.   Intake/Output Summary (Last 24 hours) at 01/04/2022 1338 Last data filed at 01/04/2022 1300 Gross per 24 hour  Intake 1081.55 ml  Output 1425 ml  Net -343.45 ml   Date 01/04/22 0701 - 01/05/22 0700  Shift 0701-1900 1901-0700 24 Hour Total  INTAKE  P.O. 240  240    P.O. 240  240  I.V.(mL/kg) 156.7(1.4)  156.7(1.4)    Volume (mL) Amiodarone 97.9  97.9    Volume (mL) (0.9 %  sodium chloride infusion) 58.8  58.8  Shift Total(mL/kg) 396.7(3.6)  396.7(3.6)  OUTPUT   Urine(mL/kg/hr) 525  525    Output (mL) (Urethral Catheter Colbert Coyer, RN Non-latex 20 Fr.) 078  675  Shift Total(mL/kg) 525(4.7)  525(4.7)  NET -128.3  -128.3  Weight (kg) 111.1 111.1 111.1   Filed Weights   01/02/22 0500 01/03/22 0500 01/04/22 0500  Weight: 100.1 kg 107.2 kg 111.1 kg    Examination: Resting comfortably no distress Follows commands, alert and oriented RRR Lungs are clear and breathing is nonlabored Mild edema bilaterally  Labs reviewed: Cr 4.49 plateaued and downtrendign BUN 132 Na 135 K 3.6 WBC 11.3 Hgb 11.6   Resolved Hospital Problem list   [place]  Assessment & Plan:  AKI on CKD stage IIIb in setting of diarrheal illness With underlying cardiorenal syndrome - likely a viral gastroenteritis  - urine output recovering, responding to lasix.   - CVC HD line placed incase of need for HD and monitoring of CVP - Continue midodrine 10  - Nephrology following, will hold on CRRT for now - Continue holding nephrotoxic medications - Monitor I/O's  Complicated Klebsiella Pneumoniae and Serratia Marcescens UTI - cystitis complicated by chronic indwelling foley present on admission review of notes show first needed in in 2019.  - Sensitive to Ceftriaxone  - Continue Ceftriaxone , day 4 of 7.   Acute respiratory failure with hypoxia from acute on chronic combined systolic and diastolic heart failure - [place] 90.6 - on BiPAP overnight, on 2L this morning Sat 93%, normal work of breathing - Wean supplemental oxygen to maintain Sat > 92% - Continue holding lasix, Entresto, and Jardiance  - heart failure following, appreciate recs.   Thrombocytopenia - likely secondary to acute illness.  - Continue SQ Heparin for now.   Hx Paroxysmal atrial fibrillation Frequent PVC's / NSVT - not on apixiban, hx of recurrent GI bleeds - PVC burden improved with amiodarone, continue for now   Best Practice (right click and "Reselect all SmartList Selections" daily)    Diet/type: Regular consistency (see orders) DVT prophylaxis: prophylactic heparin   GI prophylaxis: PPI Lines: Central line Foley:  Yes, and it is still needed Code Status:  full code Last date of multidisciplinary goals of care discussion [Spoke to daughter and gave updates on 2/10]  Lenice Llamas, MD Pulmonary and [place] 01/04/2022 1:38 PM Pager: see AMION  If no response to pager, please call critical care on call (see AMION) until 7pm After 7:00 pm call El[place]

## 2022-01-04 NOTE — Progress Notes (Signed)
PCCM pick up, patient came with AKI on CKD was on CRRT, now kidney function has stabilized and will be transfer to medicine care. Other active problems, UTI, Hypoxia failure on BIPAP HS. CHF, Entresto on hold.

## 2022-01-04 NOTE — Progress Notes (Signed)
RT note. Patient not requiring bipap at this time. Patient on [place] sat 97%, bipap is in patients room if needed later. RT will continue to monitor.

## 2022-01-04 NOTE — Progress Notes (Signed)
[facility] Progress Note  Name: Patricia Wagner MRN: [id number] DOB: October 29, 1951  Chief Complaint:  Loose stools and malaise  Subjective:  she had 850 mL UOP over 2/10.  She is really hoping to avoid dialysis.  She feels ok.  She thinks when she builds up fluid sometimes it's in her abdomen.   Review of systems:   Denies overt shortness of breath Denies n/v Denies chest pain Chronic foley  No oxygen at home  ----------- Background on consult:  71 y/o female with a PMH chronic systolic CHF, A-fib not on Eliquis due to history of diverticular GI bleeding, PVCs, CKD stage IIIb, chronic systolic CHF due to nonischemic cardiomyopathy (EF 35 to 40%), rheumatoid arthritis, Raynaud's disease, CVA, COPD, hypertension, hyperlipidemia. She was evaluated by her PCP 5 days prior to presentation for diarrhea and had a near syncopal event in the lobby. She was found to be hypoxic at the office and EMS was called, but she declined. She presented to the ED yesterday for slurred speech,and right arm weakness. She was hypoxic to the 70-80s and was placed on BiPAP after an ABG showed a pH of 7.1, PCO2 of 62, and a PO2 of 79. Pressures were soft and initial labs showed a leukocytosis of 12.9, thrombocytopenia with a platelet count of 98k, bicarb of 16, BUN of 128, sCr of 4.9 (BL of 1.3-1.5). U/A showing large amounts of leukocytes, >50 WBC, and many bacteria with negative nitrites.  Nephrology was consulted for her AKI on CKD stage IIIb.  On evaluation, patient is currently on BiPAP she is answering questions by shaking and nodding her head. She denies new medications. Per bedside nurse she had a "mucous" colored stool x1.    Intake/Output Summary (Last 24 hours) at 01/04/2022 0628 Last data filed at 01/04/2022 0610 Gross per 24 hour  Intake 967.29 ml  Output 975 ml  Net -7.71 ml    Vitals:  Vitals:   01/04/22 0400 01/04/22 0430 01/04/22 0500 01/04/22 0530  BP: 106/60 (!) 104/50 (!) 118/57  103/61  Pulse: 63 (!) 53 65 67  Resp: (!) 22 (!) 21 (!) 24 (!) 24  Temp:      TempSrc:      SpO2: 94% (!) 88% 97% 91%  Weight:   111.1 kg   Height:         Physical Exam:     General adult female in bed in NAD  HEENT normocephalic atraumatic extraocular movements intact sclera anicteric Neck supple trachea midline Lungs clear to auscultation; unlabored at rest on 4 liters Heart S1S2 no rub Abdomen soft nontender obese habitus Extremities 1+ edema  Psych no anxiety or agitation  Neuro - awake and conversant; follows commands and provides a history Gu - foley in place Access: she has a nontunneled dialysis catheter in RIJ   Medications reviewed   Labs:  BMP Latest Ref Rng & Units 01/04/2022 01/03/2022 01/03/2022  Glucose 70 - 99 mg/dL 118(H) 117(H) 102(H)  BUN 8 - 23 mg/dL 132(H) 128(H) 129(H)  Creatinine 0.44 - 1.00 mg/dL 4.49(H) 4.49(H) 4.94(H)  BUN/Creat Ratio 6 - 22 (calc) - - -  Sodium 135 - 145 mmol/L 135 135 135  Potassium 3.5 - 5.1 mmol/L 3.6 3.7 3.6  Chloride 98 - 111 mmol/L 96(L) 98 98  CO2 22 - 32 mmol/L 26 25 26   Calcium 8.9 - 10.3 mg/dL 8.1(L) 8.1(L) 8.2(L)     Assessment/Plan:   # AKI - ATN from ischemic insults and multiple  pre-renal insults in setting of diuretics/lasix, entresto, and jardiance which are now all on hold.  May also have a component of cardiorenal syndrome.  She had had multiple AKI events in the past.  Renal US without hydro and kidneys normal echogenicity. R/o GN as below.  Less likely as up/cr ratio 390 mg/g - continue midodrine 10 mg TID - GN w/u as below - UOP improving.  No emergent indication for dialysis.  Should her clearance not improve she may ultimately require dialysis/RRT however I am hopeful with improving pressures and improving urine output that her clearance will follow (note primary team placed a line and they chose a dialysis catheter in the event that it was needed) - lasix 80 mg IV once today    # Acute Hypercarbic resp  failure  - transition to BIPAP as needed per primary team  - continue supplemental oxygen as needed    # Metabolic acidosis - setting of AKI - s/p bicarb gtt    # Chronic combined systolic and diastolic CHF  - Lasix 80 mg IV once now and assess needs daily  - home entresto and jardiance on hold   # Hematuria  - minimal proteinuria  - complement, ANA, ANCA, anti-GBM are all in process   # Diarrhea - w/u per primary team  - s/p bicarb gtt as above    # CKD stage 3 - Cr baseline 1.3 - 1.7   # UTI  - abx per primary team  - on ceftriaxone  # Hyperphosphatemia  - improved last check  - check phos in am  # Chronic urinary retention  - chronic foley catheter  - foley was exchanged here per nursing  Disposition - continue monitoring in ICU  Claudia Desanctis, MD 01/04/2022 6:49 AM

## 2022-01-05 ENCOUNTER — Inpatient Hospital Stay (HOSPITAL_COMMUNITY): Payer: Medicare HMO

## 2022-01-05 DIAGNOSIS — I5043 Acute on chronic combined systolic (congestive) and diastolic (congestive) heart failure: Secondary | ICD-10-CM | POA: Diagnosis not present

## 2022-01-05 LAB — COOXEMETRY PANEL
Carboxyhemoglobin: 1.3 % (ref 0.5–1.5)
Methemoglobin: 1.2 % (ref 0.0–1.5)
O2 Saturation: 80 %
Total hemoglobin: 11.3 g/dL — ABNORMAL LOW (ref 12.0–16.0)

## 2022-01-05 LAB — BASIC METABOLIC PANEL
Anion gap: 12 (ref 5–15)
BUN: 123 mg/dL — ABNORMAL HIGH (ref 8–23)
CO2: 25 mmol/L (ref 22–32)
Calcium: 7.8 mg/dL — ABNORMAL LOW (ref 8.9–10.3)
Chloride: 97 mmol/L — ABNORMAL LOW (ref 98–111)
Creatinine, Ser: 3.88 mg/dL — ABNORMAL HIGH (ref 0.44–1.00)
GFR, Estimated: 12 mL/min — ABNORMAL LOW (ref >60)
Glucose, Bld: 102 mg/dL — ABNORMAL HIGH (ref 70–99)
Potassium: 3.3 mmol/L — ABNORMAL LOW (ref 3.5–5.1)
Sodium: 134 mmol/L — ABNORMAL LOW (ref 135–145)

## 2022-01-05 LAB — CBC
HCT: 35.1 % — ABNORMAL LOW (ref 36.0–46.0)
Hemoglobin: 11.1 g/dL — ABNORMAL LOW (ref 12.0–15.0)
MCH: 27.9 pg (ref 26.0–34.0)
MCHC: 31.6 g/dL (ref 30.0–36.0)
MCV: 88.2 fL (ref 80.0–100.0)
Platelets: 53 10*3/uL — ABNORMAL LOW (ref 150–400)
RBC: 3.98 MIL/uL (ref 3.87–5.11)
RDW: 15 % (ref 11.5–15.5)
WBC: 10.2 10*3/uL (ref 4.0–10.5)
nRBC: 0.2 % (ref 0.0–0.2)

## 2022-01-05 LAB — PHOSPHORUS: Phosphorus: 6 mg/dL — ABNORMAL HIGH (ref 2.5–4.6)

## 2022-01-05 MED ORDER — FUROSEMIDE 10 MG/ML IJ SOLN
80.0000 mg | Freq: Every day | INTRAMUSCULAR | Status: AC
  Administered 2022-01-05 – 2022-01-06 (×2): 80 mg via INTRAVENOUS
  Filled 2022-01-05 (×2): qty 8

## 2022-01-05 MED ORDER — POTASSIUM CHLORIDE CRYS ER 20 MEQ PO TBCR
40.0000 meq | EXTENDED_RELEASE_TABLET | Freq: Once | ORAL | Status: AC
  Administered 2022-01-05: 40 meq via ORAL
  Filled 2022-01-05: qty 2

## 2022-01-05 NOTE — Plan of Care (Signed)
°  Problem: Clinical Measurements: °Goal: Respiratory complications will improve °Outcome: Progressing °Goal: Cardiovascular complication will be avoided °Outcome: Progressing °  °Problem: Coping: °Goal: Level of anxiety will decrease °Outcome: Progressing °  °Problem: Pain Managment: °Goal: General experience of comfort will improve °Outcome: Progressing °  °Problem: Safety: °Goal: Ability to remain free from injury will improve °Outcome: Progressing °  °

## 2022-01-05 NOTE — Assessment & Plan Note (Addendum)
AKI on CKD 3b Cardiorenal syndrome w/ ischemic ATN secondary to diuretics Lasix Entresto Jardiance etc. -Creatinine peaked at 5.4 -Renal ultrasound negative for hydronephrosis -Diuresed with IV Lasix and albumin, now on torsemide -Nephrology following, seen by palliative care as well, wishes for full code and full scope of treatment -See discussion above, kidney function stabilizing, HD catheter removed

## 2022-01-05 NOTE — Progress Notes (Signed)
[facility] Progress Note  Name: Patricia Wagner MRN: [id number] DOB: August 17, 1951  Chief Complaint:  Loose stools and malaise  Subjective:  Reviewed hospitalist note from 2/11 - note that she has never been on CRRT or dialysis.  She had 1.6 liters UOP over 2/11.  She was moved to the floor.  Feels well.    Review of systems:    Denies shortness of breath Denies n/v Denies chest pain Chronic foley  No oxygen at home  ----------- Background on consult:  71 y/o female with a PMH chronic systolic CHF, A-fib not on Eliquis due to history of diverticular GI bleeding, PVCs, CKD stage IIIb, chronic systolic CHF due to nonischemic cardiomyopathy (EF 35 to 40%), rheumatoid arthritis, Raynaud's disease, CVA, COPD, hypertension, hyperlipidemia. She was evaluated by her PCP 5 days prior to presentation for diarrhea and had a near syncopal event in the lobby. She was found to be hypoxic at the office and EMS was called, but she declined. She presented to the ED yesterday for slurred speech,and right arm weakness. She was hypoxic to the 70-80s and was placed on BiPAP after an ABG showed a pH of 7.1, PCO2 of 62, and a PO2 of 79. Pressures were soft and initial labs showed a leukocytosis of 12.9, thrombocytopenia with a platelet count of 98k, bicarb of 16, BUN of 128, sCr of 4.9 (BL of 1.3-1.5). U/A showing large amounts of leukocytes, >50 WBC, and many bacteria with negative nitrites.  Nephrology was consulted for her AKI on CKD stage IIIb.  On evaluation, patient is currently on BiPAP she is answering questions by shaking and nodding her head. She denies new medications. Per bedside nurse she had a "mucous" colored stool x1.    Intake/Output Summary (Last 24 hours) at 01/05/2022 0752 Last data filed at 01/05/2022 0546 Gross per 24 hour  Intake 1203.77 ml  Output 1610 ml  Net -406.23 ml    Vitals:  Vitals:   01/05/22 0100 01/05/22 0539 01/05/22 0540 01/05/22 0726  BP:  (!) 110/52  (!)  108/51  Pulse:   63 75  Resp:  16  16  Temp:  98.6 F (37 C)  98.5 F (36.9 C)  TempSrc:  Oral  Oral  SpO2:  98%  95%  Weight: 112.6 kg     Height:         Physical Exam:      General adult female in bed in NAD  HEENT normocephalic atraumatic extraocular movements intact sclera anicteric Neck supple trachea midline Lungs clear to auscultation anteriorly; increased work of breathing with exertion and not at rest; on 3.5 liters Heart S1S2 no rub Abdomen soft nontender obese habitus Extremities 1+ edema  Psych no anxiety or agitation  Neuro - awake and conversant; follows commands and provides a history Gu - foley in place Access: she has a nontunneled dialysis catheter in RIJ   Medications reviewed   Labs:  BMP Latest Ref Rng & Units 01/05/2022 01/04/2022 01/03/2022  Glucose 70 - 99 mg/dL 102(H) 118(H) 117(H)  BUN 8 - 23 mg/dL 123(H) 132(H) 128(H)  Creatinine 0.44 - 1.00 mg/dL 3.88(H) 4.49(H) 4.49(H)  BUN/Creat Ratio 6 - 22 (calc) - - -  Sodium 135 - 145 mmol/L 134(L) 135 135  Potassium 3.5 - 5.1 mmol/L 3.3(L) 3.6 3.7  Chloride 98 - 111 mmol/L 97(L) 96(L) 98  CO2 22 - 32 mmol/L 25 26 25   Calcium 8.9 - 10.3 mg/dL 7.8(L) 8.1(L) 8.1(L)  Assessment/Plan:   # AKI - ATN from ischemic insults and multiple pre-renal insults in setting of diuretics/lasix, entresto, and jardiance which are now all on hold.  Appears to also have a component of cardiorenal syndrome given improvement with diuretics.  She had had multiple AKI events in the past.  Renal US without hydro and kidneys normal echogenicity. R/o GN as below.  Less likely as up/cr ratio 390 mg/g - continue midodrine 10 mg TID. Levo order discontinued as pt on floor - GN w/u as below - AKI is resolving.  No emergent indication for dialysis and I am hopeful she will not need it as is the patient.  She has never received dialysis or CRRT. (Note critical care team placed a line for levo and elected for a dialysis catheter in the  event that it was needed per report - dialysis catheter never requested by nephrology) - lasix 80 mg IV once today and in AM   # Acute Hypercarbic and hypoxic resp failure  - s/p BIPAP as needed; per primary team  - continue supplemental oxygen as needed    # Metabolic acidosis - setting of AKI - s/p bicarb gtt    # Chronic combined systolic and diastolic CHF  - Lasix 80 mg IV once today and in AM.  Assess needs daily - home entresto and jardiance on hold   # Hematuria  - minimal proteinuria. ANA neg (was prev positive).  Complement ok. Anti-GBM negative - ANCA still in process  # Diarrhea - w/u per primary team  - s/p bicarb gtt as above    # CKD stage 3 - Cr baseline 1.3 - 1.7   # UTI  - abx per primary team  - on ceftriaxone  # Hyperphosphatemia  - hopeful for improvement with improving AKI  - renal panel in am  # Chronic urinary retention  - chronic foley catheter  - foley was exchanged here per nursing  Disposition - continue monitoring  Claudia Desanctis, MD 01/05/2022 8:29 AM

## 2022-01-05 NOTE — Progress Notes (Signed)
PROGRESS NOTE  Patricia Wagner SHF:[id number] DOB: [birth year] DOA: 12/30/2021 PCP: Janith Lima, MD  Brief History   71 year old female who presents with increasing respiratory distress and has a well-documented past medical history of atrial fibrillation, chronic kidney disease, congestive heart failure, rheumatoid arthritis, Raynaud's disease, cardiovascular accident, COPD, urinary retention hypertension hyperlipidemia with known congestive heart failure.  She was trialed on noninvasive mechanical ventilatory support with no improvement of her pH of 7.1 therefore pulmonary critical care was called to the bedside.  She was started on a bicarbonate drip she will be transferred to the intensive care unit and has a low threshold for being intubated.  She may need a central line and/or hemodialysis catheter in the near future.  The patient has been transferred to the hospitalist service for the remainder of her care. Cardiology has been consulted as has nephrology.   Consultants  Cardiology Nephrology PCCM  Procedures  None  Antibiotics   Anti-infectives (From admission, onward)    Start     Dose/Rate Route Frequency Ordered Stop   01/01/22 1400  cefTRIAXone (ROCEPHIN) 1 g in sodium chloride 0.9 % 100 mL IVPB        1 g 200 mL/hr over 30 Minutes Intravenous Every 24 hours 01/01/22 1313 01/08/22 1359      Subjective  The patient is resting comfortably. She is communicating with me non-verbally. I ask her to speak to me and she will not. She shakes her head "no" that she has no new problems.  Objective   Vitals:  Vitals:   01/05/22 0800 01/05/22 1113  BP: 115/63 (!) 108/53  Pulse: 75 67  Resp: 19 20  Temp:  98.3 F (36.8 C)  SpO2: 100% 100%    Exam:  Constitutional:  The patient is awake and alert. She is non-verbally communicative. No acute distress. Respiratory:  No increased work of breathing. No wheezes, rales, or rhonchi No tactile fremitus Cardiovascular:  Regular  rate and rhythm No murmurs, ectopy, or gallups. No lateral PMI. No thrills. Abdomen:  Abdomen is soft, non-tender, non-distended No hernias, masses, or organomegaly Normoactive bowel sounds.  Musculoskeletal:  No cyanosis or clubbing +1 edema Skin:  No rashes, lesions, ulcers palpation of skin: no induration or nodules Neurologic:  CN 2-12 intact Sensation all 4 extremities intact Not speaking to me Psychiatric:  Mental status - unable to determine   I have personally reviewed the following:   Today's Data  Vitals  Lab Data  CBC BMP  Micro Data  Urine culture positive for serratia marcescens and klebsiella pneumoniae  Imaging  CXR  Cardiology Data  EKG Echocardiogram  Other Data    Scheduled Meds:  chlorhexidine  15 mL Mouth Rinse BID   Chlorhexidine Gluconate Cloth  6 each Topical Daily   furosemide  80 mg Intravenous Daily   mouth rinse  15 mL Mouth Rinse q12n4p   midodrine  10 mg Oral TID WC   pantoprazole  40 mg Oral Daily   sodium chloride flush  10-40 mL Intracatheter Q12H   Continuous Infusions:  sodium chloride Stopped (01/04/22 2119)   sodium chloride     amiodarone 30 mg/hr (01/05/22 1259)   cefTRIAXone (ROCEPHIN)  IV 1 g (01/05/22 1413)    Principal Problem:   Acute on chronic combined systolic and diastolic CHF (congestive heart failure) (HCC) Active Problems:   CAD in native artery, s/p cardiac cath with non obstructive CAD   PAF (paroxysmal atrial fibrillation) (HCC)   Acute renal  failure superimposed on stage 3b chronic kidney disease ([place])   Slurred speech   Thrombocytopenia (HCC)   Leukocytosis   Acute renal failure (ARF) (HCC)   LOS: 5 days   A & P  Assessment and Plan: * Acute on chronic combined systolic and diastolic CHF (congestive heart failure) ([place])- (present on admission) Mild to moderate mitral regurgitation Acute hypoxic hypercapnic respiratory failure Hypoxic in the 70s to 80s on room air.  Blood gas showing pH  7.1, PCO2 62, and PO2 79.  Currently stable on BiPAP.  Blood pressure low with systolic in the 33I.  BNP >4500.  Concern for possible cardiogenic shock, however, extremities warm to touch and lactic acid normal. Chest x-ray showing cardiomegaly and vascular congestion. Echo done September 2022 showing EF 35 to 40%, global hypokinesis, grade 1 diastolic dysfunction, mild to moderate mitral regurgitation. -Continue BiPAP.  Avoiding diuretics at this time due to acute kidney injury and soft blood pressure.  Hold Jardiance and [place].  Repeat echocardiogram 12/31/2021 has demonstrated EF 45-50% with mildly decreased LV function. There is global hypokinesis. Diastolic function could not be evaluated. The right ventricle is severely enlarged. There is moderately reduced systolic function. There is moderately elevated pulmonary artery systolic pressure. The left atrium is mildly dilated and the right atrum is severely dilated. There is moderate to severe tricuspid valve regurgitation. Compared to LVEF from 08/12/2021 echo the EF is improved and there is less dilation of the LV. The tricuspid insufficiency appears worse.  The patient is now in atrial fibrillation.  Acute renal failure (ARF) ([place])- (present on admission) Creatinine upon admission was 5.03. It peaked the following am at 5.11. it is currently 3.88. She has had poor urinary output, although this is improving. This is likely due to ischemic insults and multiple pre-renal insults. She has a history of multiple AKI incidents in the past. Renal US is negative.  Nephrology has been consulted. The patient is receiving lasix at 80 mg IV daily. She has metabolic acidosis due to AKI. She receive sodium bicarbonate in a drip initially. Thsi is now resolved.   Leukocytosis- (present on admission) Resolved.  Mild and likely reactive.  Chest x-ray not suggestive of pneumonia. -UA consistent with UTI. Urine culture has grown out Klebsiella pneumoniae and Serratia  Marcescens. Both are sensitive to Rocephin.  Thrombocytopenia ([place])- (present on admission) Platelet count 53k, previously normal.  No signs of active bleeding. Will stop heparin due to low platelets.  Slurred speech- (present on admission) Blood ethanol level undetectable.  CT head negative for acute finding.  Unable to assess the patient's speech at this time as she is on BiPAP, no other focal neurodeficit.  Neurology felt that this was likely related to hypoxia rather than acute CVA. -Continue to monitor.  UDS negative.  Pt was non-verbal for me. Will check MRI brain. Possibly due to acute metabolic encephalopathy from AKI.  Acute renal failure superimposed on stage 3b chronic kidney disease ([place])- (present on admission) BUN 123, creatinine 3.99(baseline 1.3-1.5).  Patient is on Lasix 80 mg daily. Renal ultrasound is normal. Nephrology is consulted. I appreciate their assistance.  PAF (paroxysmal atrial fibrillation) ([place])- (present on admission) Status post ablation in 2020 and amiodarone was stopped.  Not on Eliquis due to history of recurrent diverticular GI bleeding.  Remains in sinus rhythm. -Continue to monitor   CAD in native artery, s/p cardiac cath with non obstructive CAD- (present on admission) Elevated troponin likely due to demand ischemia from decompensated heart failure.  Troponin elevated but stable (5916 >5616).  Patient is not endorsing chest pain.  Cardiac catheterization done in 2017 showing mild nonobstructive CAD.  Started on IV heparin in the ED due to concern for possible ACS. -Continue IV heparin, echocardiogram, cardiology consulted. The patient may require dobutamine for RV support. She is unable to tolerate GDMT due to hypotension and CKD. She will be continued on midodrine 10 mg tid. Unna boots are in place.    I have seen and examined this patient myself. I have spent 36 minutes in her evaluation and care.  DVT prophylaxis: SCD's Code Status: Full  Code Family Communication: None available Disposition Plan: tbd    Nazaret Chea, DO [facility] Direct contact: see [email or web address]  7PM-7AM contact night coverage as above 01/05/2022, 4:08 PM  LOS: 5 days

## 2022-01-06 ENCOUNTER — Other Ambulatory Visit (HOSPITAL_COMMUNITY): Payer: Self-pay

## 2022-01-06 ENCOUNTER — Inpatient Hospital Stay (HOSPITAL_COMMUNITY): Payer: Medicare HMO

## 2022-01-06 ENCOUNTER — Other Ambulatory Visit: Payer: Self-pay

## 2022-01-06 DIAGNOSIS — I639 Cerebral infarction, unspecified: Secondary | ICD-10-CM | POA: Diagnosis not present

## 2022-01-06 DIAGNOSIS — I779 Disorder of arteries and arterioles, unspecified: Secondary | ICD-10-CM

## 2022-01-06 DIAGNOSIS — I5043 Acute on chronic combined systolic (congestive) and diastolic (congestive) heart failure: Secondary | ICD-10-CM | POA: Diagnosis not present

## 2022-01-06 LAB — CBC
HCT: 35.3 % — ABNORMAL LOW (ref 36.0–46.0)
Hemoglobin: 10.9 g/dL — ABNORMAL LOW (ref 12.0–15.0)
MCH: 27.5 pg (ref 26.0–34.0)
MCHC: 30.9 g/dL (ref 30.0–36.0)
MCV: 89.1 fL (ref 80.0–100.0)
Platelets: 60 10*3/uL — ABNORMAL LOW (ref 150–400)
RBC: 3.96 MIL/uL (ref 3.87–5.11)
RDW: 15.2 % (ref 11.5–15.5)
WBC: 10.2 10*3/uL (ref 4.0–10.5)
nRBC: 0.2 % (ref 0.0–0.2)

## 2022-01-06 LAB — RENAL FUNCTION PANEL
Albumin: 2.1 g/dL — ABNORMAL LOW (ref 3.5–5.0)
Anion gap: 12 (ref 5–15)
BUN: 115 mg/dL — ABNORMAL HIGH (ref 8–23)
CO2: 26 mmol/L (ref 22–32)
Calcium: 8.2 mg/dL — ABNORMAL LOW (ref 8.9–10.3)
Chloride: 99 mmol/L (ref 98–111)
Creatinine, Ser: 3.84 mg/dL — ABNORMAL HIGH (ref 0.44–1.00)
GFR, Estimated: 12 mL/min — ABNORMAL LOW (ref >60)
Glucose, Bld: 111 mg/dL — ABNORMAL HIGH (ref 70–99)
Phosphorus: 5.6 mg/dL — ABNORMAL HIGH (ref 2.5–4.6)
Potassium: 3.7 mmol/L (ref 3.5–5.1)
Sodium: 137 mmol/L (ref 135–145)

## 2022-01-06 LAB — COOXEMETRY PANEL
Carboxyhemoglobin: 1.2 % (ref 0.5–1.5)
Methemoglobin: 1.2 % (ref 0.0–1.5)
O2 Saturation: 75.7 %
Total hemoglobin: 11.4 g/dL — ABNORMAL LOW (ref 12.0–16.0)

## 2022-01-06 MED ORDER — MEXILETINE HCL 200 MG PO CAPS
200.0000 mg | ORAL_CAPSULE | Freq: Two times a day (BID) | ORAL | Status: DC
  Administered 2022-01-06 – 2022-01-13 (×14): 200 mg via ORAL
  Filled 2022-01-06 (×16): qty 1

## 2022-01-06 MED ORDER — APIXABAN 5 MG PO TABS
5.0000 mg | ORAL_TABLET | Freq: Two times a day (BID) | ORAL | Status: DC
  Administered 2022-01-06 – 2022-01-18 (×25): 5 mg via ORAL
  Filled 2022-01-06 (×25): qty 1

## 2022-01-06 MED ORDER — ALBUMIN HUMAN 25 % IV SOLN
25.0000 g | Freq: Four times a day (QID) | INTRAVENOUS | Status: AC
  Administered 2022-01-06 (×2): 25 g via INTRAVENOUS
  Filled 2022-01-06 (×2): qty 100

## 2022-01-06 NOTE — Progress Notes (Signed)
Carotid artery duplex completed. Refer to "CV Proc" under chart review to view preliminary results.  01/06/2022 4:09 PM Kelby Aline., MHA, RVT, RDCS, RDMS

## 2022-01-06 NOTE — Progress Notes (Addendum)
[facility] NEPHROLOGY PROGRESS NOTE  Assessment/ Plan: Pt is a 71 y.o. yo female  with a PMH chronic systolic CHF, A-fib not on Eliquis due to history of diverticular GI bleeding, PVCs, CKD stage IIIb, chronic systolic CHF due to nonischemic cardiomyopathy (EF 35 to 40%), rheumatoid arthritis, Raynaud's disease, CVA, COPD, hypertension, hyperlipidemia.  #Acute kidney injury on CKD stage IIIb likely ischemic ATN due to multiple prerenal insult related with the use of diuretics/Lasix, Entresto, Jardiance concomitant with some component of cardiorenal syndrome.  Kidney ultrasound without hydronephrosis.  Urine is cloudy with many bacteria and cells.  ANA, anti-GBM antibody, complements unremarkable, pending ANCA.  She is getting IV Lasix with increased urine output today.  Serum creatinine and BUN seems to be heading towards good direction.  No uremic features to warrant urgent dialysis although she already has temporary HD catheter in place. Continue with strict ins and out, daily lab.  #Acute hypoxic and hypercapnic respiratory failure: Volume management with diuretics and BiPAP as needed.  #Chronic combined systolic and diastolic CHF: Continue to hold Entresto and Jardiance because of AKI.  Diuretics as above.  I will order IV albumin to augment diuresis.  # UTI: Urine culture is growing Klebsiella and Serratia, currently on ceftriaxone.  #Metabolic acidosis: Improved.  #Anemia of chronic disease/CKD: Hemoglobin at goal.  #Hypotension: She is on midodrine.  Monitor BP.  #Hyperphosphatemia: Phosphorus 5.6.  Continue renal diet and if no improvement in phosphorus level she may need binders.  Subjective: Seen and examined.  Denies nausea, vomiting, chest pain, anorexia or dysgeusia.  She said breathing is better.  She is trying to eat breakfast.  Urine output is recorded 1.9 L.  Objective Vital signs in last 24 hours: Vitals:   01/06/22 0019 01/06/22 0026 01/06/22 0404  01/06/22 0405  BP: (!) 106/56  (!) 114/48   Pulse: 65     Resp: 20  20   Temp: 98.5 F (36.9 C)   (!) 97.5 F (36.4 C)  TempSrc: Oral   Oral  SpO2: 100%  98%   Weight:  111.3 kg    Height:       Weight change: -1.287 kg  Intake/Output Summary (Last 24 hours) at 01/06/2022 0915 Last data filed at 01/06/2022 0400 Gross per 24 hour  Intake 1140.22 ml  Output 1900 ml  Net -759.78 ml       Labs: Basic Metabolic Panel: Recent Labs  Lab 01/01/22 0643 01/01/22 1449 01/04/22 0453 01/05/22 0500 01/06/22 0415  NA 136   < > 135 134* 137  K 2.9*   < > 3.6 3.3* 3.7  CL 81*   < > 96* 97* 99  CO2 44*   < > 26 25 26   GLUCOSE 67*   < > 118* 102* 111*  BUN 111*   < > 132* 123* 115*  CREATININE 3.82*   < > 4.49* 3.88* 3.84*  CALCIUM 5.9*   < > 8.1* 7.8* 8.2*  PHOS 5.0*  --   --  6.0* 5.6*   < > = values in this interval not displayed.   Liver Function Tests: Recent Labs  Lab 12/30/21 2342 01/06/22 0415  AST 41  --   ALT 28  --   ALKPHOS 151*  --   BILITOT 1.3*  --   PROT 6.0*  --   ALBUMIN 3.0* 2.1*   No results for input(s): LIPASE, AMYLASE in the last 168 hours. No results for input(s): AMMONIA in the last 168 hours.  CBC: Recent Labs  Lab 12/30/21 2342 12/30/21 2348 01/03/22 0440 01/03/22 1637 01/04/22 0453 01/05/22 0500 01/06/22 0415  WBC 12.9*   < > 10.4 11.8* 11.3* 10.2 10.2  NEUTROABS 11.5*  --   --  10.2*  --   --   --   HGB 12.8   < > 11.5* 11.9* 11.6* 11.1* 10.9*  HCT 41.9   < > 36.9 37.9 37.1 35.1* 35.3*  MCV 93.1   < > 90.0 90.0 90.3 88.2 89.1  PLT 98*   < > 53* 56* 53* 53* 60*   < > = values in this interval not displayed.   Cardiac Enzymes: No results for input(s): CKTOTAL, CKMB, CKMBINDEX, TROPONINI in the last 168 hours. CBG: Recent Labs  Lab 01/02/22 1133 01/02/22 1535 01/02/22 1939 01/02/22 2306 01/03/22 0341  GLUCAP 151* 140* 141* 110* 103*    Iron Studies: No results for input(s): IRON, TIBC, TRANSFERRIN, FERRITIN in the last 72  hours. Studies/Results: MR BRAIN WO CONTRAST  Result Date: 01/05/2022 CLINICAL DATA:  Neuro deficit, acute, stroke suspected. EXAM: MRI HEAD WITHOUT CONTRAST TECHNIQUE: Multiplanar, multiecho pulse sequences of the brain and surrounding structures were obtained without intravenous contrast. COMPARISON:  Head CT 12/30/2021 and MRI 08/06/2018 FINDINGS: Brain: Scattered punctate acute cortical and subcortical infarcts are present in the frontal lobes, parietal lobes, and superior right occipital lobe. There is also a single punctate acute infarct in the right cerebellar hemisphere. A chronic superior left cerebellar infarct is again noted with associated chronic blood products. There is also a small chronic infarct in the superomedial aspect of the right occipital lobe. Scattered small T2 hyperintensities in the cerebral white matter bilaterally have mildly progressed from the prior MRI and are nonspecific but compatible with mild chronic small vessel ischemic disease. The ventricles are normal in size. No mass, midline shift, or extra-axial fluid collection is identified. A partially empty sella is unchanged from the prior MRI. Vascular: Major intracranial vascular flow voids are preserved. Skull and upper cervical spine: Unremarkable bone marrow signal. Sinuses/Orbits: Unremarkable orbits. Small mucous retention cyst in a left ethmoid air cell. Trace left mastoid fluid. Other: Unchanged small Tornwaldt cyst. IMPRESSION: 1. Scattered punctate acute bilateral cerebral and right cerebellar infarcts suggesting a central embolic source. 2. Mild chronic small vessel ischemic disease with chronic infarcts as above. Electronically Signed   By: Logan Bores M.D.   On: 01/05/2022 18:34    Medications: Infusions:  sodium chloride Stopped (01/04/22 2119)   sodium chloride     amiodarone 30 mg/hr (01/06/22 0023)   cefTRIAXone (ROCEPHIN)  IV Stopped (01/05/22 1445)    Scheduled Medications:  chlorhexidine  15 mL  Mouth Rinse BID   Chlorhexidine Gluconate Cloth  6 each Topical Daily   furosemide  80 mg Intravenous Daily   mouth rinse  15 mL Mouth Rinse q12n4p   midodrine  10 mg Oral TID WC   pantoprazole  40 mg Oral Daily   sodium chloride flush  10-40 mL Intracatheter Q12H    have reviewed scheduled and prn medications.  Physical Exam: General:NAD, comfortable Heart:RRR, s1s2 nl Lungs:clear b/l, no crackle, no increased work of breathing Abdomen:soft, Non-tender, non-distended Extremities: 1+ pitting edema present Dialysis Access: Temporary HD catheter, right IJ  Monserath Neff Prasad Cindel Daugherty 01/06/2022,9:15 AM  LOS: 6 days

## 2022-01-06 NOTE — Assessment & Plan Note (Addendum)
On admission the patient was found to have dysarthria.  MRI brain noted scattered tine acute infarcts bilaterally consistent with embolic stroke.  Carotid duplex noted minimal plaque in the extracranial vessels.  Neurology was consulted, with recommendations to continue apixaban for secondary prophylaxis, and aggressive risk factors modification.   Plan to continue blood pressure support with midodrine, anticoagulation with apixaban and low dose statin therapy.

## 2022-01-06 NOTE — Patient Outreach (Signed)
[facility]) Care Management  01/06/2022  MARIN WISNER 1951/11/09 [id number]   Case Closure    Case has been transferred to [place] program and patient to be followed by assigned RN CM at PCP office.    Plan: RN CM will close case.   Enzo Montgomery, RN,BSN,CCM [place] Management Telephonic Care Management Coordinator Direct Phone: [phone] Toll Free: [phone]: [phone]

## 2022-01-06 NOTE — Consult Note (Addendum)
Neurology Consultation  Reason for Consult: Stroke Referring Physician: Karie Kirks, DO  CC: CVA.  History is obtained from:patient  HPI: Patricia Wagner is a 71 y.o. female with PMHx of Afib not on eliquis due to hx of recurrent diverticular GI bleeding, PVCs, CKD IIIb, chronic systolic CHF (EF 92-42%) due to nonischemic cardiomyopathy, rheumatoid arthritis, Raynaud's disease, COPD, HTN, HLD presenting with increasing respiratory distress. Patient had required a bicarbonate drip and was transferred to the ICU. On 2/8, patient was started on ceftriaxone for complicated UTI 2/2 chronic foley. On 2/9, BUN 134 so RIJ HD Cath was placed but patient never received dialysis or CRRT. Patient was found to be mute on 2/12 which prompted MRI which showed bilateral scattered infarcts suggestive of a cardioembolic source. Patient was started on Eliquis today by primary team.  Neurology consulted due to scattered acute infarcts on MRI and change in neurological status. Patient seen and assessed at bedside. Patient AxO x 4. Patient denies focal weakness or sensory deficits. Patient denies any aphasia/dysarthria yesterday. Patient has difficulty raising legs for prolonged periods of time and ambulating but this is a chronic problem due to knee replacement.    LKW: 2/12 noted to have dysarthria TNK given?: no, outside of window IR Thrombectomy? No, no LVO Modified Rankin Scale: 0-Completely asymptomatic and back to baseline post- stroke  ROS: A complete 12 ROS was performed and is negative except as noted in the HPI.   Past Medical History:  Diagnosis Date   Acute blood loss anemia 03/05/2018   Acute hypercapnic respiratory failure ([place]) 04/30/2017   Acute renal failure superimposed on chronic kidney disease ([place]) 10/20/2016   Arthritis    hands, knees   Atrial fibrillation ([place])    a. s/p multiple cardioversions; failed tikosyn/sotalol.   Bradycardia 11/20/2016   CAD in native artery, s/p cardiac cath with  non obstructive CAD 10/24/2016   Cerebrovascular accident (CVA) due to embolism of left cerebellar artery (HCC)    CHF (congestive heart failure) ([place])    Chronic anticoagulation 03/15/2018   Chronic atrial fibrillation ([place]) 6/83/4196   Chronic systolic (congestive) heart failure ([place]) 03/05/2018   CKD (chronic kidney disease) stage 3, GFR 30-59 ml/min (HCC)    Coagulopathy ([place]) 03/05/2018   DIVERTICULITIS, HX OF 07/25/2007   Diverticulosis of colon with hemorrhage 07/22/2007   Qualifier: Diagnosis of  By: [name]     DIVERTICULOSIS, COLON 07/22/2007   Dysuria 07/21/2017   Edema, peripheral    a. chronic BLE edema, R>L. Prior trauma from dog attack and accident.   Essential hypertension 07/22/2007   Qualifier: Diagnosis of  By: [name]     History of kidney stones    HLD (hyperlipidemia) 02/03/2008   Qualifier: Diagnosis of  By: [name], James W    HYPERLIPIDEMIA 02/03/2008   Hypersomnia    declines w/u   Hypertension    Hypotension (arterial) 04/30/2017   MENOPAUSAL DISORDER 01/09/2011   Morbid obesity ([place]) 07/22/2007   NICM (nonischemic cardiomyopathy) ([place]) 10/24/2016   PAF (paroxysmal atrial fibrillation) ([place])    Presence of indwelling urinary catheter    supra pubic cath   Raynaud's syndrome 07/22/2007   Stroke ([place]) 2017   Suspected sleep apnea 05/20/2017   THYROID NODULE, RIGHT 01/04/2010   VITAMIN D DEFICIENCY 01/09/2011   Qualifier: Diagnosis of  By: [name], [name]      Family History  Problem Relation Age of Onset   Asthma Mother    Diabetes Father  Heart disease Father        Died of presumed heart attack - 69s   Lung disease Sister    Heart disease Sister        Twin sister has heart issue, unclear what kind   Healthy Daughter    Thyroid disease Neg Hx    Colon polyps Neg Hx    Esophageal cancer Neg Hx    Pancreatic cancer Neg Hx    Stomach cancer Neg Hx      Social History:   reports that she quit smoking about 4 years ago. Her smoking use  included cigarettes. She smoked an average of .5 packs per day. She has never used smokeless tobacco. She reports current alcohol use. She reports that she does not use drugs.  Medications  Current Facility-Administered Medications:    0.9 %  sodium chloride infusion, , Intravenous, PRN, Spero Geralds, MD, Stopped at 01/04/22 2119   0.9 %  sodium chloride infusion, 250 mL, Intravenous, Continuous, Spero Geralds, MD   acetaminophen (TYLENOL) tablet 650 mg, 650 mg, Oral, Q6H PRN **OR** acetaminophen (TYLENOL) suppository 650 mg, 650 mg, Rectal, Q6H PRN, Spero Geralds, MD   albumin human 25 % solution 25 g, 25 g, Intravenous, Q6H, Rosita Fire, MD, Last Rate: 60 mL/hr at 01/06/22 1050, 25 g at 01/06/22 1050   apixaban (ELIQUIS) tablet 5 mg, 5 mg, Oral, BID, [place], Jennifer D, RPH, 5 mg at 01/06/22 1337   cefTRIAXone (ROCEPHIN) 1 g in sodium chloride 0.9 % 100 mL IVPB, 1 g, Intravenous, Q24H, Spero Geralds, MD, Last Rate: 200 mL/hr at 01/06/22 1337, 1 g at 01/06/22 1337   chlorhexidine (PERIDEX) 0.12 % solution 15 mL, 15 mL, Mouth Rinse, BID, Spero Geralds, MD, 15 mL at 01/06/22 1043   Chlorhexidine Gluconate Cloth 2 % PADS 6 each, 6 each, Topical, Daily, Spero Geralds, MD, 6 each at 01/06/22 1030   heparin injection 1,000-6,000 Units, 1,000-6,000 Units, CRRT, PRN, Spero Geralds, MD, 2,400 Units at 01/02/22 1834   loperamide (IMODIUM) capsule 2 mg, 2 mg, Oral, Q4H PRN, Spero Geralds, MD, 2 mg at 01/06/22 1194   MEDLINE mouth rinse, 15 mL, Mouth Rinse, q12n4p, Spero Geralds, MD, 15 mL at 01/05/22 1200   mexiletine (MEXITIL) capsule 200 mg, 200 mg, Oral, Q12H, Joette Catching, PA-C   midodrine (PROAMATINE) tablet 10 mg, 10 mg, Oral, TID WC, Spero Geralds, MD, 10 mg at 01/06/22 1350   pantoprazole (PROTONIX) EC tablet 40 mg, 40 mg, Oral, Daily, Spero Geralds, MD, 40 mg at 01/06/22 1021   sodium chloride flush (NS) 0.9 % injection 10-40 mL, 10-40 mL, Intracatheter,  Q12H, Spero Geralds, MD, 10 mL at 01/06/22 1053   sodium chloride flush (NS) 0.9 % injection 10-40 mL, 10-40 mL, Intracatheter, PRN, Spero Geralds, MD   Exam: Current vital signs: BP (!) 103/51 (BP Location: Left Arm)    Pulse 81    Temp (!) 97.5 F (36.4 C) (Oral)    Resp 17    Ht 5\' 4"  (1.626 m)    Wt 111.3 kg    SpO2 95%    BMI 42.12 kg/m  Vital signs in last 24 hours: Temp:  [97.5 F (36.4 C)-98.5 F (36.9 C)] 97.5 F (36.4 C) (02/13 0405) Pulse Rate:  [65-81] 81 (02/13 0800) Resp:  [17-20] 17 (02/13 0800) BP: (98-114)/(48-56) 103/51 (02/13 0800) SpO2:  [93 %-100 %] 95 % (02/13 0800) Weight:  [  111.3 kg] 111.3 kg (02/13 0026)  GENERAL: Awake, alert, in no acute distress Psych: Affect appropriate for situation, patient is calm and cooperative with examination Head: Normocephalic and atraumatic, without obvious abnormality EENT: Normal conjunctivae, dry mucous membranes, no OP obstruction LUNGS: Normal respiratory effort. Non-labored breathing on room air CV: Regular rate and rhythm on telemetry ABDOMEN: Soft, non-tender, non-distended Extremities: warm, well perfused, without obvious deformity  NEURO:  Mental Status: Awake, alert, and oriented to person, place, time, and situation.  able to provide some history of present illness Speech/Language: speech is labored due to shortness of breath but able to say full sentences Naming, repetition, fluency, and comprehension intact without aphasia  No neglect is noted Cranial Nerves:  II: PERRL. visual fields full.  III, IV, VI: EOMI. Lid elevation symmetric and full.  V: Sensation is intact to light touch and symmetrical to face. Blinks to threat. Moves jaw back and forth.  VII: Face is symmetric resting and smiling. Able to puff cheeks and raise eyebrows.  VIII: Hearing intact to voice IX, X: Palate elevation is symmetric. Phonation normal.  XI: Normal sternocleidomastoid and trapezius muscle strength XII: Tongue protrudes  midline without fasciculations.   Motor: 5/5 strength in BUE. Able to lift lower extremities antigravity but only able to keep up for 1-2 seconds.   Tone is normal. Bulk is normal.  Sensation: Intact to light touch bilaterally in all four extremities. No extinction to DSS present.  Coordination: FTN intact bilaterally. No pronator drift. Alternating hand movements.  DTRs: 2+ throughout.  Gait: Deferred  NIHSS: 1a Level of Conscious.: 0 1b LOC Questions: 0 1c LOC Commands: 0 2 Best Gaze: 0 3 Visual: 0 4 Facial Palsy: 0 5a Motor Arm - left: 0 5b Motor Arm - Right: 0 6a Motor Leg - Left: 2 6b Motor Leg - Right: 2 7 Limb Ataxia: 0 8 Sensory: 0 9 Best Language: 0 10 Dysarthria: 0 11 Extinct. and Inatten.: 0 TOTAL: 4   Labs I have reviewed labs in epic and the results pertinent to this consultation are:   CBC    Component Value Date/Time   WBC 10.2 01/06/2022 0415   RBC 3.96 01/06/2022 0415   HGB 10.9 (L) 01/06/2022 0415   HGB 12.5 04/17/2021 1608   HCT 35.3 (L) 01/06/2022 0415   HCT 39.6 04/17/2021 1608   PLT 60 (L) 01/06/2022 0415   PLT 202 04/17/2021 1608   MCV 89.1 01/06/2022 0415   MCV 88 04/17/2021 1608   MCH 27.5 01/06/2022 0415   MCHC 30.9 01/06/2022 0415   RDW 15.2 01/06/2022 0415   RDW 13.1 04/17/2021 1608   LYMPHSABS 0.3 (L) 01/03/2022 1637   LYMPHSABS 0.8 04/17/2021 1608   MONOABS 1.1 (H) 01/03/2022 1637   EOSABS 0.0 01/03/2022 1637   EOSABS 0.0 04/17/2021 1608   BASOSABS 0.0 01/03/2022 1637   BASOSABS 0.0 04/17/2021 1608    CMP     Component Value Date/Time   NA 137 01/06/2022 0415   NA 139 04/17/2021 1608   K 3.7 01/06/2022 0415   CL 99 01/06/2022 0415   CO2 26 01/06/2022 0415   GLUCOSE 111 (H) 01/06/2022 0415   BUN 115 (H) 01/06/2022 0415   BUN 34 (H) 04/17/2021 1608   CREATININE 3.84 (H) 01/06/2022 0415   CREATININE 1.78 (H) 10/15/2021 1508   CALCIUM 8.2 (L) 01/06/2022 0415   PROT 6.0 (L) 12/30/2021 2342   PROT 7.0 04/17/2021 1608    ALBUMIN 2.1 (L) 01/06/2022 4128  ALBUMIN 4.1 04/17/2021 1608   AST 41 12/30/2021 2342   ALT 28 12/30/2021 2342   ALKPHOS 151 (H) 12/30/2021 2342   BILITOT 1.3 (H) 12/30/2021 2342   BILITOT 0.3 04/17/2021 1608   GFRNONAA 12 (L) 01/06/2022 0415   GFRNONAA 40 (L) 02/01/2021 1524   GFRAA 46 (L) 02/01/2021 1524    Lipid Panel     Component Value Date/Time   CHOL 171 05/22/2021 1435   CHOL 136 01/24/2020 1216   TRIG 128.0 05/22/2021 1435   HDL 69.70 05/22/2021 1435   HDL 71 01/24/2020 1216   CHOLHDL 2 05/22/2021 1435   VLDL 25.6 05/22/2021 1435   LDLCALC 75 05/22/2021 1435   LDLCALC 51 01/24/2020 1216   LDLDIRECT 121.6 03/15/2012 1646     Imaging I have reviewed the images obtained:  MRI examination of the brain: Acute bilateral scattered punctate cerebral and cerebellar infarcts.    IMPRESSION: 1. Scattered punctate acute bilateral cerebral and right cerebellar infarcts suggesting a central embolic source. 2. Mild chronic small vessel ischemic disease with chronic infarcts as above.  Assessment: Patricia Wagner is a 71 y.o. female with PMHx of Afib not on eliquis due to hx of recurrent diverticular GI bleeding, PVCs, CKD IIIb, chronic systolic CHF (EF 73-71%) due to nonischemic cardiomyopathy, rheumatoid arthritis, Raynaud's disease, COPD, HTN, HLD presenting with increasing respiratory distress. Patient was found to be mute on 2/12 and MRI showed bilateral punctate infarcts. Patient was started on Eliquis on 2/13. Will be seen by stroke team tomorrow.  Impression: Multiple bilateral acute scattered punctate infarcts etiology cardioembolic likely 2/2 afib not on eliquis. Patient does not appear to have any acute neurological deficits at this time. Echo EF 45-50%. Hypoperfusion could also be contributory to stroke as patient's SBP regularly in 80s and 90s since admission.  Recommendations: - Recommend vascular imaging with MRA head and neck. Patient has Cr 3.84, GFR 12 so will not  tolerate contrast - Recommend labs: HbA1c, lipid panel, TSH. - Recommend Statin if LDL > 70 - Consider TTE. - Recommend orthostatic vitals - Permissive hypertension first 24 h < 220/110.  - Recommend bedside Swallow screen. - Recommend Stroke education. - PT recommending SNF -Stroke team to follow tomorrow  France Ravens, MD PGY1 Resident  ATTENDING ATTESTATION:  Transferred to hospitalist from [place] found to have cardioembolic strokes on MRI after noting speech changes. H/o afib not on AC. Started on eliquis today. Low risk for hemorragic conversion. Exam as above. Stroke team to follow tomorrow.  Dr. Reeves Forth evaluated pt independently, reviewed imaging, chart, labs. Discussed and formulated plan with the resident. Please see resident note above for details.   Changes made where appropriate.  MDM: high due to complexity, new embolic stroke. Review of MRI, CT imaging personally and on blood thinner. Risk of bleed is possible.   Tollie Canada,MD

## 2022-01-06 NOTE — Progress Notes (Signed)
ANTICOAGULATION CONSULT NOTE - Initial Consult  Pharmacy Consult for apixaban Indication: atrial fibrillation and stroke  Allergies  Allergen Reactions   Ace Inhibitors Palpitations    Patient Measurements: Height: 5\' 4"  (162.6 cm) Weight: 111.3 kg (245 lb 6.4 oz) IBW/kg (Calculated) : 54.7  Vital Signs: Temp: 97.5 F (36.4 C) (02/13 0405) Temp Source: Oral (02/13 0405) BP: 103/51 (02/13 0800) Pulse Rate: 81 (02/13 0800)  Labs: Recent Labs    01/04/22 0453 01/05/22 0500 01/06/22 0415  HGB 11.6* 11.1* 10.9*  HCT 37.1 35.1* 35.3*  PLT 53* 53* 60*  CREATININE 4.49* 3.88* 3.84*    Estimated Creatinine Clearance: 17.1 mL/min (A) (by C-G formula based on SCr of 3.84 mg/dL (H)).   Medical History: Past Medical History:  Diagnosis Date   Acute blood loss anemia 03/05/2018   Acute hypercapnic respiratory failure ([place]) 04/30/2017   Acute renal failure superimposed on chronic kidney disease ([place]) 10/20/2016   Arthritis    hands, knees   Atrial fibrillation ([place])    a. s/p multiple cardioversions; failed tikosyn/sotalol.   Bradycardia 11/20/2016   CAD in native artery, s/p cardiac cath with non obstructive CAD 10/24/2016   Cerebrovascular accident (CVA) due to embolism of left cerebellar artery (HCC)    CHF (congestive heart failure) ([place])    Chronic anticoagulation 03/15/2018   Chronic atrial fibrillation ([place]) 9/70/2637   Chronic systolic (congestive) heart failure ([place]) 03/05/2018   CKD (chronic kidney disease) stage 3, GFR 30-59 ml/min (HCC)    Coagulopathy ([place]) 03/05/2018   DIVERTICULITIS, HX OF 07/25/2007   Diverticulosis of colon with hemorrhage 07/22/2007   Qualifier: Diagnosis of  By: [name]     DIVERTICULOSIS, COLON 07/22/2007   Dysuria 07/21/2017   Edema, peripheral    a. chronic BLE edema, R>L. Prior trauma from dog attack and accident.   Essential hypertension 07/22/2007   Qualifier: Diagnosis of  By: [name]     History of kidney stones     HLD (hyperlipidemia) 02/03/2008   Qualifier: Diagnosis of  By: [name], James W    HYPERLIPIDEMIA 02/03/2008   Hypersomnia    declines w/u   Hypertension    Hypotension (arterial) 04/30/2017   MENOPAUSAL DISORDER 01/09/2011   Morbid obesity ([place]) 07/22/2007   NICM (nonischemic cardiomyopathy) ([place]) 10/24/2016   PAF (paroxysmal atrial fibrillation) ([place])    Presence of indwelling urinary catheter    supra pubic cath   Raynaud's syndrome 07/22/2007   Stroke ([place]) 2017   Suspected sleep apnea 05/20/2017   THYROID NODULE, RIGHT 01/04/2010   VITAMIN D DEFICIENCY 01/09/2011   Qualifier: Diagnosis of  By: [name], [name]     Medications:  Medications Prior to Admission  Medication Sig Dispense Refill Last Dose   Acetaminophen (TYLENOL PO) Take 1 tablet by mouth every 6 (six) hours as needed (headache/pain).   Past Week   furosemide (LASIX) 80 MG tablet Take 1 tablet (80 mg total) by mouth daily. Take extra tab in PM as needed (Patient taking differently: Take 80 mg by mouth daily. Take extra 80mg  in PM as needed) 60 tablet 11 Past Week   JARDIANCE 10 MG TABS tablet TAKE 1 TABLET BY MOUTH DAILY BEFORE BREAKFAST. (Patient taking differently: Take 10 mg by mouth daily.) 30 tablet 3 Past Week   Potassium Chloride ER 20 MEQ TBCR TAKE 1 TABLET BY MOUTH EVERY DAY (Patient taking differently: Take 20 mEq by mouth daily.) 90 tablet 3 Past Week   predniSONE (DELTASONE) 5 MG  tablet Take 1 tablet (5 mg total) by mouth daily with breakfast. (Patient taking differently: Take 5 mg by mouth daily with breakfast. Continuous) 90 tablet 0 Past Week   rosuvastatin (CRESTOR) 10 MG tablet Take 1 tablet (10 mg total) by mouth daily. 90 tablet 1 Past Week   sacubitril-valsartan (ENTRESTO) 24-26 MG Take 1 tablet by mouth 2 (two) times daily. 60 tablet 3 Past Week   leflunomide (ARAVA) 20 MG tablet Take 1 tablet (20 mg total) by mouth daily. (Patient not taking: Reported on 01/01/2022) 30 tablet 2 Not Taking    Assessment: 71  yof admitted with slurred speech and RUE weakness with concern for CVA although CT head showed old infarct. She has a hx Afib and is not on [facility] PTA due to recurrent GI bleeding - she has been on apixaban in the past. Pharmacy consulted to dose apixaban as MRI shows scattered punctate acute bilateral cerebral and right cerebellar infarcts suggesting central embolic source.   She is <71 yo, >60 kg, and SCr is >1.5 therefore meets criteria for full dose apixaban. No bleeding noted, Hgb low stable 11s, platelets are low at 60 (low on admit and prior to IV heparin, 168 on 10/15/21).  Plan:  Apixaban 5 mg PO bid Monitor for s/sx of bleeding Education prior to discharge   Thank you for involving pharmacy in this patient's care.  Renold Genta, PharmD, BCPS Clinical Pharmacist Clinical phone for 01/06/2022 until 3p is [phone] 01/06/2022 10:56 AM  **Pharmacist phone directory can be found on [email or web address] listed under [place]**

## 2022-01-06 NOTE — Care Management Important Message (Signed)
Important Message  Patient Details  Name: Patricia Wagner MRN: [id number] Date of Birth: 10/03/1951   Medicare Important Message Given:  Yes     [name] 01/06/2022, 8:46 AM

## 2022-01-06 NOTE — Plan of Care (Signed)
  Problem: Activity: Goal: Capacity to carry out activities will improve Outcome: Progressing   Problem: Cardiac: Goal: Ability to achieve and maintain adequate cardiopulmonary perfusion will improve Outcome: Progressing   

## 2022-01-06 NOTE — TOC Benefit Eligibility Note (Signed)
Patient Advocate Encounter  Insurance verification completed.    The patient is currently admitted and upon discharge could be taking Eliquis 5 mg.  The current 30 day co-pay is, $45.00.   The patient is insured through [employer] Part D     Neftali Abair, CPhT Pharmacy Patient Advocate Specialist [facility] Patient Advocate Team Direct Number: [phone]  Fax: [phone]        

## 2022-01-06 NOTE — Progress Notes (Signed)
PROGRESS NOTE  Patricia Wagner KCL:[id number] DOB: 04/19/1951 DOA: 12/30/2021 PCP: Janith Lima, MD  Brief History   71 year old female who presents with increasing respiratory distress and has a well-documented past medical history of atrial fibrillation, chronic kidney disease, congestive heart failure, rheumatoid arthritis, Raynaud's disease, cardiovascular accident, COPD, urinary retention hypertension hyperlipidemia with known congestive heart failure.  She was trialed on noninvasive mechanical ventilatory support with no improvement of her pH of 7.1 therefore pulmonary critical care was called to the bedside.  She was started on a bicarbonate drip she will be transferred to the intensive care unit and has a low threshold for being intubated.  She may need a central line and/or hemodialysis catheter in the near future.  The patient has been transferred to the hospitalist service for the remainder of her care. Cardiology has been consulted as has nephrology.   On 01/05/2022 the patient was only non-verbally communicative with me. MRI brain was obtained due to this change in neurological status. It has demonstrated multiple tiny scattered acute infarcts. These are consistent with embolic infarcts. Carotid doppler was obtained and was negative. She has been started on eliquis for her atrial fibrillation. Neurology has been consulted.   Consultants  Cardiology Nephrology PCCM Neurology  Procedures  None  Antibiotics   Anti-infectives (From admission, onward)    Start     Dose/Rate Route Frequency Ordered Stop   01/01/22 1400  cefTRIAXone (ROCEPHIN) 1 g in sodium chloride 0.9 % 100 mL IVPB        1 g 200 mL/hr over 30 Minutes Intravenous Every 24 hours 01/01/22 1313 01/08/22 1359      Subjective  The patient is resting comfortably. Today the patient is talking to me. Her speech is thick and difficult to understand. She is asking appropriate questions.  Objective   Vitals:  Vitals:    01/06/22 0405 01/06/22 0800  BP:  (!) 103/51  Pulse:  81  Resp:  17  Temp: (!) 97.5 F (36.4 C)   SpO2:  95%    Exam:  Constitutional:  The patient is awake, alert, and oriented x 3. No acute distress. Respiratory:  No increased work of breathing. No wheezes, rales, or rhonchi No tactile fremitus Cardiovascular:  Regular rate and rhythm No murmurs, ectopy, or gallups. No lateral PMI. No thrills. Abdomen:  Abdomen is soft, non-tender, non-distended No hernias, masses, or organomegaly Normoactive bowel sounds.  Musculoskeletal:  No cyanosis or clubbing +1 edema Skin:  No rashes, lesions, ulcers palpation of skin: no induration or nodules Neurologic:  CN 2-12 intact Sensation all 4 extremities intact Dysarthric speech Psychiatric:  Mental status - unable to determine   I have personally reviewed the following:   Today's Data  Vitals  Lab Data  CBC BMP  Micro Data  Urine culture positive for serratia marcescens and klebsiella pneumoniae  Imaging  CXR CT head  MRI brain  Cardiology Data  EKG Echocardiogram  Other Data    Scheduled Meds:  apixaban  5 mg Oral BID   chlorhexidine  15 mL Mouth Rinse BID   Chlorhexidine Gluconate Cloth  6 each Topical Daily   mouth rinse  15 mL Mouth Rinse q12n4p   mexiletine  200 mg Oral Q12H   midodrine  10 mg Oral TID WC   pantoprazole  40 mg Oral Daily   sodium chloride flush  10-40 mL Intracatheter Q12H   Continuous Infusions:  sodium chloride Stopped (01/04/22 2119)   sodium chloride  albumin human 25 g (01/06/22 1050)   cefTRIAXone (ROCEPHIN)  IV 1 g (01/06/22 1337)    Principal Problem:   Acute on chronic combined systolic and diastolic CHF (congestive heart failure) (HCC) Active Problems:   CAD in native artery, s/p cardiac cath with non obstructive CAD   PAF (paroxysmal atrial fibrillation) (HCC)   Acute renal failure superimposed on stage 3b chronic kidney disease (HCC)   Slurred speech    Thrombocytopenia (HCC)   Leukocytosis   Acute renal failure (ARF) ([place])   Acute CVA (cerebrovascular accident) ([place])   LOS: 6 days   A & P  Assessment and Plan: * Acute on chronic combined systolic and diastolic CHF (congestive heart failure) ([place])- (present on admission) Mild to moderate mitral regurgitation Acute hypoxic hypercapnic respiratory failure Hypoxic in the 70s to 80s on room air.  Blood gas showing pH 7.1, PCO2 62, and PO2 79.  Currently stable on BiPAP.  Blood pressure low with systolic in the 97Q.  BNP >4500.  Concern for possible cardiogenic shock, however, extremities warm to touch and lactic acid normal. Chest x-ray showing cardiomegaly and vascular congestion. Echo done September 2022 showing EF 35 to 40%, global hypokinesis, grade 1 diastolic dysfunction, mild to moderate mitral regurgitation. -Continue BiPAP.  Avoiding diuretics at this time due to acute kidney injury and soft blood pressure.  Hold Jardiance and [place].  Repeat echocardiogram 12/31/2021 has demonstrated EF 45-50% with mildly decreased LV function. There is global hypokinesis. Diastolic function could not be evaluated. The right ventricle is severely enlarged. There is moderately reduced systolic function. There is moderately elevated pulmonary artery systolic pressure. The left atrium is mildly dilated and the right atrum is severely dilated. There is moderate to severe tricuspid valve regurgitation. Compared to LVEF from 08/12/2021 echo the EF is improved and there is less dilation of the LV. The tricuspid insufficiency appears worse.  The patient is now in atrial fibrillation.  Acute CVA (cerebrovascular accident) [facility]) On admission the patient was found to have dysarthria. CT head was performed and was negative for acute findings. The patient was transferred to the floor on 01/05/2022. Note from PCCM was that if her speech worsened MRI brain should be performed. When I saw the patient on 01/05/2022, she was  non-verbally communicative with me. MRI brain was obtained. It has demonstrated scattered tine acute infarcts bilaterally consistent with embolic stroke. The patient does have atrial fibrillation. She has been started on eliquis and neurology has been consulted. Echocardiogram performed on 12/31/2021 demonstrated no intracardiac source of emboli. Carotid ultrasound was performed today and has demonstrated bear-normal extracranial vesels with only minimal wall thickening or plaque. Bilateral vertebral arteries demonstrated antegrade flow. Right subclavian artery was not visualized. Normal flow hemodynamic was seen in the left subclavian artery. Neurology has been consulted.  Acute renal failure (ARF) ([place])- (present on admission) Creatinine upon admission was 5.03. It peaked the following am at 5.11. it is currently 3.88. She has had poor urinary output, although this is improving. This is likely due to ischemic insults and multiple pre-renal insults. She has a history of multiple AKI incidents in the past. Renal US is negative.  Nephrology has been consulted. The patient is receiving lasix at 80 mg IV daily. She has metabolic acidosis due to AKI. She receive sodium bicarbonate in a drip initially. Thsi is now resolved.   Leukocytosis- (present on admission) Resolved.  Mild and likely reactive.  Chest x-ray not suggestive of pneumonia. -UA consistent with UTI. Urine  culture has grown out Klebsiella pneumoniae and Serratia Marcescens. Both are sensitive to Rocephin.  Thrombocytopenia ([place])- (present on admission) Platelet count 53k, previously normal.  No signs of active bleeding. Will stop heparin due to low platelets.  Slurred speech- (present on admission) Blood ethanol level undetectable.  CT head negative for acute finding.  Unable to assess the patient's speech at this time as she is on BiPAP, no other focal neurodeficit.  Neurology felt that this was likely related to hypoxia rather than acute  CVA. The patient is speaking today, although speech is difficult to understand. Per nursing the patient is irascible and sometimes confused.  MRI brain demonstrates scattered tiny acute infarct. Likely from an embolic source. I have started the patient on eliquis and consulted neurology.  Acute renal failure superimposed on stage 3b chronic kidney disease ([place])- (present on admission) BUN 123, creatinine 3.99(baseline 1.3-1.5).  Patient is on Lasix 80 mg daily. Renal ultrasound is normal. Nephrology is consulted. I appreciate their assistance.  PAF (paroxysmal atrial fibrillation) ([place])- (present on admission) Status post ablation in 2020 and amiodarone was stopped.  Not on Eliquis due to history of recurrent diverticular GI bleeding.  Remains in sinus rhythm. -Continue to monitor   CAD in native artery, s/p cardiac cath with non obstructive CAD- (present on admission) Elevated troponin likely due to demand ischemia from decompensated heart failure.  Troponin elevated but stable (5916 >5616).  Patient is not endorsing chest pain.  Cardiac catheterization done in 2017 showing mild nonobstructive CAD.  Started on IV heparin in the ED due to concern for possible ACS. -Continue IV heparin, echocardiogram, cardiology consulted. The patient may require dobutamine for RV support. She is unable to tolerate GDMT due to hypotension and CKD. She will be continued on midodrine 10 mg tid. Unna boots are in place.    I have seen and examined this patient myself. I have spent 32 minutes in her evaluation and care.  DVT prophylaxis: SCD's Code Status: Full Code Family Communication: None available Disposition Plan: tbd    Trellis Vanoverbeke, DO [facility] Direct contact: see [email or web address]  7PM-7AM contact night coverage as above 01/06/2022, 5:12 PM  LOS: 5 days

## 2022-01-06 NOTE — Progress Notes (Signed)
Mobility Specialist Progress Note:   01/06/22 1400  Mobility  Activity Transferred from chair to bed  Level of Assistance Moderate assist, patient does 50-74% (+2 (lines))  Assistive Device Stedy  Activity Response Tolerated well  $Mobility charge 1 Mobility   RN requesting assistance getting pt back to bed. Required min-modA +2 (line management) to stand in stedy. Pt left in bed with all needs met, RN present.   Nelta Numbers [facility] Phone: [phone] Office Phone: [phone]

## 2022-01-06 NOTE — Progress Notes (Addendum)
Advanced Heart Failure Rounding Note  PCP-Cardiologist: Glori Bickers, MD   Subjective:     R IJ HD catheter placed 02/09  Co-ox 76% today. CVP 12  Scr peaked at 5.24, trending down to 3.84.  1900 cc UOP charted yesterday. 80 mg lasix IV yesterday and today. Nephrology starting albumin to facilitate diuresis.  Continues on amiodarone gtt for PVC suppression.   Primary team obtained MRI brain yesterday d/t slurred speech. Scattered punctate acute b/l cerebral and right cerebellar infarcts suggesting central embolic source  Feels okay. Denies dyspnea at rest. No CP. Can't get comfortable in bed. Still with slurring of speech   Objective:    111.3 kg Body mass index is 42.12 kg/m.   Vital Signs:   Temp:  [97.5 F (36.4 C)-98.5 F (36.9 C)] 97.5 F (36.4 C) (02/13 0405) Pulse Rate:  [65-72] 65 (02/13 0019) Resp:  [18-20] 20 (02/13 0404) BP: (98-114)/(48-66) 114/48 (02/13 0404) SpO2:  [92 %-100 %] 98 % (02/13 0404) Weight:  [111.3 kg] 111.3 kg (02/13 0026) Last BM Date: 01/05/22  Weight change: Filed Weights   01/04/22 2135 01/05/22 0100 01/06/22 0026  Weight: 112.6 kg 112.6 kg 111.3 kg    Intake/Output:   Intake/Output Summary (Last 24 hours) at 01/06/2022 0903 Last data filed at 01/06/2022 0400 Gross per 24 hour  Intake 1140.22 ml  Output 1900 ml  Net -759.78 ml      Physical Exam   CVP 12 General:  No distress. Sitting up in bed HEENT: normal Neck: supple. no JVD. R IJ HD cath. Carotids 2+ bilat; no bruits.  Cor: PMI nondisplaced. Regular rate & rhythm. No rubs, gallops or murmurs. Lungs: clear Abdomen: soft, nontender, nondistended.  Extremities: no cyanosis, clubbing, rash, 1+ edema, + UNNA Neuro: alert & orientedx3, appears fatigued, some speech appears slurred   Telemetry   SR 70s-80s, variable PVCs (up to 10/min)  Labs    CBC Recent Labs    01/03/22 1637 01/04/22 0453 01/05/22 0500 01/06/22 0415  WBC 11.8*   < > 10.2 10.2   NEUTROABS 10.2*  --   --   --   HGB 11.9*   < > 11.1* 10.9*  HCT 37.9   < > 35.1* 35.3*  MCV 90.0   < > 88.2 89.1  PLT 56*   < > 53* 60*   < > = values in this interval not displayed.   Basic Metabolic Panel Recent Labs    01/03/22 1637 01/04/22 0453 01/05/22 0500 01/06/22 0415  NA 135   < > 134* 137  K 3.7   < > 3.3* 3.7  CL 98   < > 97* 99  CO2 25   < > 25 26  GLUCOSE 117*   < > 102* 111*  BUN 128*   < > 123* 115*  CREATININE 4.49*   < > 3.88* 3.84*  CALCIUM 8.1*   < > 7.8* 8.2*  MG 2.7*  --   --   --   PHOS  --   --  6.0* 5.6*   < > = values in this interval not displayed.   Liver Function Tests Recent Labs    01/06/22 0415  ALBUMIN 2.1*   No results for input(s): LIPASE, AMYLASE in the last 72 hours. Cardiac Enzymes No results for input(s): CKTOTAL, CKMB, CKMBINDEX, TROPONINI in the last 72 hours.  BNP: BNP (last 3 results) Recent Labs    12/30/21 2342  BNP >4,500.0*    ProBNP (  last 3 results) No results for input(s): PROBNP in the last 8760 hours.   D-Dimer No results for input(s): DDIMER in the last 72 hours. Hemoglobin A1C No results for input(s): HGBA1C in the last 72 hours. Fasting Lipid Panel No results for input(s): CHOL, HDL, LDLCALC, TRIG, CHOLHDL, LDLDIRECT in the last 72 hours. Thyroid Function Tests No results for input(s): TSH, T4TOTAL, T3FREE, THYROIDAB in the last 72 hours.  Invalid input(s): FREET3  Other results:   Imaging    MR BRAIN WO CONTRAST  Result Date: 01/05/2022 CLINICAL DATA:  Neuro deficit, acute, stroke suspected. EXAM: MRI HEAD WITHOUT CONTRAST TECHNIQUE: Multiplanar, multiecho pulse sequences of the brain and surrounding structures were obtained without intravenous contrast. COMPARISON:  Head CT 12/30/2021 and MRI 08/06/2018 FINDINGS: Brain: Scattered punctate acute cortical and subcortical infarcts are present in the frontal lobes, parietal lobes, and superior right occipital lobe. There is also a single punctate  acute infarct in the right cerebellar hemisphere. A chronic superior left cerebellar infarct is again noted with associated chronic blood products. There is also a small chronic infarct in the superomedial aspect of the right occipital lobe. Scattered small T2 hyperintensities in the cerebral white matter bilaterally have mildly progressed from the prior MRI and are nonspecific but compatible with mild chronic small vessel ischemic disease. The ventricles are normal in size. No mass, midline shift, or extra-axial fluid collection is identified. A partially empty sella is unchanged from the prior MRI. Vascular: Major intracranial vascular flow voids are preserved. Skull and upper cervical spine: Unremarkable bone marrow signal. Sinuses/Orbits: Unremarkable orbits. Small mucous retention cyst in a left ethmoid air cell. Trace left mastoid fluid. Other: Unchanged small Tornwaldt cyst. IMPRESSION: 1. Scattered punctate acute bilateral cerebral and right cerebellar infarcts suggesting a central embolic source. 2. Mild chronic small vessel ischemic disease with chronic infarcts as above. Electronically Signed   By: Logan Bores M.D.   On: 01/05/2022 18:34     Medications:     Scheduled Medications:  chlorhexidine  15 mL Mouth Rinse BID   Chlorhexidine Gluconate Cloth  6 each Topical Daily   furosemide  80 mg Intravenous Daily   mouth rinse  15 mL Mouth Rinse q12n4p   midodrine  10 mg Oral TID WC   pantoprazole  40 mg Oral Daily   sodium chloride flush  10-40 mL Intracatheter Q12H    Infusions:  sodium chloride Stopped (01/04/22 2119)   sodium chloride     amiodarone 30 mg/hr (01/06/22 0023)   cefTRIAXone (ROCEPHIN)  IV Stopped (01/05/22 1445)    PRN Medications: sodium chloride, acetaminophen **OR** acetaminophen, heparin, loperamide, sodium chloride flush    Patient Profile   71 y/o female w/ chronic biventricular heart failure/ nonischemic CM, felt likely tachymediated from Afib/PVCs, Stage  IIIb CKD and neurogenic bladder w/ urinary retention w/ chronic foley, admitted w/ acute hypoxic respiratory failure and AKI, in setting of UTI and a/c CHF.   Assessment/Plan   1. Acute on Chronic Biventricular Heart Failure, w/ Predominant RV Failure - NICM, LHC 2017 w/ mild nonobstructive CAD - Echo 6/18 EF 15% in setting of tachy CM from AF - Echo 02/2018: EF 40-45%, grade 1 DD, RV normal - Echo 8/20 45-50% Personally reviewed - Echo 9/22 EF read as 35-40% (I think closer to 30%) - suspect tachymediated CM from Afib and high PVC burden  - Previous Amyloid w/u negative with negative fat pad biopsy and PYP scan (2018). May need to consider repeat PYP to reassess  for TTR amyloid given predominant RV dysfunction, progressive renal failure and autonomic dysfunction - Echo this admit, EF 40-45%, RV severely enlarged w/ moderately reduced systolic function  - Now w/ a/c CHF c/b AKI on Stage lllb CKD  - Co-ox 76% this am - Scr improving. Nearly 2L UOP yesterday. Nephrology dosing diuretics. Volume still up. CVP 12. - Unable to tolerate GDMT w/ hypotension and AKI. Home entresto and jardiance on hold - Continue midodrine 10 tid for BP support, titrate further if needed  - UNNA boots   2. AKI on CKD Stage IIIb - Baseline SCr 1.4-1.6 - Scr peaked at 5.25, down to 3.84 today.  - nephrology following, felt to be ATN + component of cardiorenal syndrome. Ruling out GN - Nephrology dosing diuretics   3. Acute Hypoxic Respiratory Failure - O2 sats 70-80s on admit, requiring BiPAP  - Flu and Covid negative  - admit CXR showed vascular congestion but diuretics held on admit due to suspected hypovolemic AKI in setting of diarrhea. Now diuresing with IV lasix. - O2 now stable on 3L [place]   4. UTI  - chronic foley for neurogenic bladder w/ urinary retention  - UA + on admit - UCx + Klebsiella Pneumoniae + Serratia Marcescens  - Treating w/ ceftriaxone (sensitive on susceptibilities) per primary team    5. Tricuspid Regurgitation  - trivial TR on echo 9/22 - severe on echo this admit - likely functional, RV severely enlarged    6. PVCs/ NSVT - frequent, 15-25 PVCs per min on tele  - Zio 7/22 showed 7.0% burden, started on mexiletine (held on admit) - On amio gtt at 30/hr - Switch back to mexiletine prior to discharge   7. NSTEMI  - Hs trop 5,916>>5,616>>5,916 - in the setting of acute renal failure  - Denies CP, EKG nonischemic. LHC 2017 minimal CAD - No plans for LHC    8. PAF - Hx AF ablation - NSR w/ PVCs - not on anticoagulation given h/o recurrent GIBs d/t diverticular disease.  - ? Ability to start anticoagulation given evidence of acute infarcts on MRI brain. Worry about bleeding risk with prior GI bleeds and thrombocytopenia.   9. Thrombocytopenia - Plt 98 on admit, 50s-60s last few days - off heparin, but doubt HIT given plts were low at baseline - suspect 2/2 critical illness  - hgb 11.1, monitor for bleeding   10. Acute CVA -Slurred speech -CT head on 02/06 with old left cerebellar infarct. No acute finding. -Initially thought to be d/t hypoxia.  -MRI brain 02/12 with scattered punctate acute b/l cerebral and right cerebellar infarcts suggesting central embolic source -management per primary -Consider neurology consult -Not anticoagulated for afib d/t hx GI bleed. See above.   PT/OT recommending SNF for rehab  Length of Stay: 6  [facility], LINDSAY N, PA-C  01/06/2022, 9:03 AM  Advanced Heart Failure Team Pager [phone] (M-F; 7a - 5p)  Please contact [facility] for night-coverage after hours (5p -7a ) and weekends on [email or web address]  Patient seen and examined with the above-signed Advanced Practice Provider and/or Housestaff. I personally reviewed laboratory data, imaging studies and relevant notes. I independently examined the patient and formulated the important aspects of the plan. I have edited the note to reflect any of my changes or salient points. I  have personally discussed the plan with the patient and/or family.  Feeling better. Perhaps mild dysarthria. Renal function improving. Co-ox ok. CVP up   General:  Sitting up in bed No  resp difficulty HEENT: normal Neck: supple. RIJ HD cath Carotids 2+ bilat; no bruits. No lymphadenopathy or thryomegaly appreciated. Cor: PMI nondisplaced. Regular rate & rhythm. No rubs, gallops or murmurs. Lungs: clear Abdomen: soft, nontender, nondistended. No hepatosplenomegaly. No bruits or masses. Good bowel sounds. Extremities: no cyanosis, clubbing, rash,2+  edema Neuro: alert & orientedx3, cranial nerves grossly intact. moves all 4 extremities w/o difficulty. ? Mild dysarthria Affect pleasant  She remains volume overload. Continue IV diuretics per Renal. Co-ox ok. No need for inotropes. Agree with apixaban for embolic CVA. Watch closely for recurrent GI bleeding. Switch amio back to mexilitene.   We will see again on Wednesday.   Glori Bickers, MD  5:44 PM

## 2022-01-06 NOTE — Progress Notes (Signed)
Physical Therapy Treatment Patient Details Name: Patricia Wagner MRN: [id number] DOB: Jan 04, 1951 Today's Date: 01/06/2022   History of Present Illness Pt is a 71 y.o. female admitted 12/30/21 with SOB, AMS, slurred speech. Initially admitted as code stroke; head CT 2/6 negative for acute finding; neurology suspects slurred speech related to hypoxia given abscence of other focal neurodeficits. Workup for metabolic encephalopathy, AKI on CKD, severe metabolic/respiratory acidosis requiring bicarbonate infusion and BiPAP, probable UTI. MRI completed 2/12 due to continued slurred speech. MRI revealed scattered punctate acute bilateral cerebral and right cerebellar infarcts suggesting a central embolic source. PMH includes CKD, CHF, RA, COPD, CVA, CAD, HTN, HLD, Raynaud's disease.    PT Comments    Pt received in bed, NT in room finishing bath and bed linen change. Pt required min assist bed mobility, +2 mod assist sit to stand in stedy, and dependent stedy transfer bed to recliner. Pt hesitant in regards to accepting physical assist or verbal instruction from therapist, repeatedly stating "I can do it." VSS on 3L. Pt in recliner with feet elevated at end of session. Pt relayed irritation with therapist, saying "I see, you get me up in this chair, then leave and go on about your business." Attempted to educate pt on importance of getting OOB.      Recommendations for follow up therapy are one component of a multi-disciplinary discharge planning process, led by the attending physician.  Recommendations may be updated based on patient status, additional functional criteria and insurance authorization.  Follow Up Recommendations  Skilled nursing-short term rehab (<3 hours/day) (Pt has been refusing.)     Assistance Recommended at Discharge Frequent or constant Supervision/Assistance  Patient can return home with the following Two people to help with walking and/or transfers;Two people to help with  bathing/dressing/bathroom   Equipment Recommendations  Wheelchair (measurements PT);Wheelchair cushion (measurements PT)    Recommendations for Other Services       Precautions / Restrictions Precautions Precautions: Fall;Other (comment) Precaution Comments: central line, chronic foley catheter     Mobility  Bed Mobility Overal bed mobility: Needs Assistance Bed Mobility: Supine to Sit     Supine to sit: Min assist, HOB elevated     General bed mobility comments: +rail, increased time    Transfers Overall transfer level: Needs assistance   Transfers: Sit to/from Stand, Bed to chair/wheelchair/BSC Sit to Stand: +2 physical assistance, Mod assist           General transfer comment: Attempted sit to stand from EOB with RW x 3 trials without success. Pt able to power up to stand in stedy. Transfer via Lift Equipment: Stedy  Ambulation/Gait                   Stairs             Wheelchair Mobility    Modified Rankin (Stroke Patients Only)       Balance Overall balance assessment: Needs assistance Sitting-balance support: No upper extremity supported, Feet supported Sitting balance-Leahy Scale: Fair     Standing balance support: Bilateral upper extremity supported, During functional activity, Reliant on assistive device for balance Standing balance-Leahy Scale: Poor                              Cognition Arousal/Alertness: Awake/alert Behavior During Therapy: Flat affect, Agitated Overall Cognitive Status: No family/caregiver present to determine baseline cognitive functioning  General Comments: Not very accepting of help/assist. Poor insight into deficits.        Exercises      General Comments General comments (skin integrity, edema, etc.): VSS on 3L      Pertinent Vitals/Pain Pain Assessment Pain Assessment: Faces Faces Pain Scale: Hurts little more Pain Location: knees  in stedy Pain Descriptors / Indicators: Grimacing, Discomfort Pain Intervention(s): Monitored during session, Repositioned    Home Living                          Prior Function            PT Goals (current goals can now be found in the care plan section) Acute Rehab PT Goals Patient Stated Goal: home Progress towards PT goals: Progressing toward goals    Frequency    Min 3X/week      PT Plan Current plan remains appropriate    Co-evaluation              AM-PAC PT "6 Clicks" Mobility   Outcome Measure  Help needed turning from your back to your side while in a flat bed without using bedrails?: A Little Help needed moving from lying on your back to sitting on the side of a flat bed without using bedrails?: A Little Help needed moving to and from a bed to a chair (including a wheelchair)?: Total Help needed standing up from a chair using your arms (e.g., wheelchair or bedside chair)?: Total Help needed to walk in hospital room?: Total Help needed climbing 3-5 steps with a railing? : Total 6 Click Score: 10    End of Session Equipment Utilized During Treatment: Gait belt;Oxygen Activity Tolerance: Patient tolerated treatment well Patient left: in chair;with call bell/phone within reach;with chair alarm set Nurse Communication: Mobility status;Need for lift equipment PT Visit Diagnosis: Other abnormalities of gait and mobility (R26.89);Muscle weakness (generalized) (M62.81)     Time: 4098-1191 PT Time Calculation (min) (ACUTE ONLY): 34 min  Charges:  $Therapeutic Activity: 23-37 mins                     Lorrin Goodell, PT  Office # [phone] Pager [phone]    Patricia Wagner 01/06/2022, 11:52 AM

## 2022-01-07 ENCOUNTER — Inpatient Hospital Stay (HOSPITAL_COMMUNITY): Payer: Medicare HMO

## 2022-01-07 ENCOUNTER — Encounter (HOSPITAL_COMMUNITY): Payer: Self-pay | Admitting: Internal Medicine

## 2022-01-07 DIAGNOSIS — I5043 Acute on chronic combined systolic (congestive) and diastolic (congestive) heart failure: Secondary | ICD-10-CM | POA: Diagnosis not present

## 2022-01-07 LAB — RENAL FUNCTION PANEL
Albumin: 2.5 g/dL — ABNORMAL LOW (ref 3.5–5.0)
Anion gap: 13 (ref 5–15)
BUN: 112 mg/dL — ABNORMAL HIGH (ref 8–23)
CO2: 25 mmol/L (ref 22–32)
Calcium: 8.3 mg/dL — ABNORMAL LOW (ref 8.9–10.3)
Chloride: 99 mmol/L (ref 98–111)
Creatinine, Ser: 3.88 mg/dL — ABNORMAL HIGH (ref 0.44–1.00)
GFR, Estimated: 12 mL/min — ABNORMAL LOW (ref >60)
Glucose, Bld: 92 mg/dL (ref 70–99)
Phosphorus: 5.3 mg/dL — ABNORMAL HIGH (ref 2.5–4.6)
Potassium: 3.7 mmol/L (ref 3.5–5.1)
Sodium: 137 mmol/L (ref 135–145)

## 2022-01-07 LAB — ANCA PROFILE
Anti-MPO Antibodies: <0.2 units (ref 0.0–0.9)
Anti-PR3 Antibodies: <0.2 units (ref 0.0–0.9)
Atypical P-ANCA titer: <1:20 {titer}
C-ANCA: <1:20 {titer}
P-ANCA: <1:20 {titer}

## 2022-01-07 LAB — CBC
HCT: 31.9 % — ABNORMAL LOW (ref 36.0–46.0)
Hemoglobin: 10.2 g/dL — ABNORMAL LOW (ref 12.0–15.0)
MCH: 28 pg (ref 26.0–34.0)
MCHC: 32 g/dL (ref 30.0–36.0)
MCV: 87.6 fL (ref 80.0–100.0)
Platelets: 70 10*3/uL — ABNORMAL LOW (ref 150–400)
RBC: 3.64 MIL/uL — ABNORMAL LOW (ref 3.87–5.11)
RDW: 15.5 % (ref 11.5–15.5)
WBC: 9.4 10*3/uL (ref 4.0–10.5)
nRBC: 0.2 % (ref 0.0–0.2)

## 2022-01-07 LAB — LIPID PANEL
Cholesterol: 84 mg/dL (ref 0–200)
HDL: 24 mg/dL — ABNORMAL LOW (ref >40)
LDL Cholesterol: 47 mg/dL (ref 0–99)
Total CHOL/HDL Ratio: 3.5 RATIO
Triglycerides: 66 mg/dL (ref <150)
VLDL: 13 mg/dL (ref 0–40)

## 2022-01-07 LAB — COOXEMETRY PANEL
Carboxyhemoglobin: 1.2 % (ref 0.5–1.5)
Methemoglobin: 1.2 % (ref 0.0–1.5)
O2 Saturation: 79.8 %
Total hemoglobin: 10.2 g/dL — ABNORMAL LOW (ref 12.0–16.0)

## 2022-01-07 LAB — HEMOGLOBIN A1C
Hgb A1c MFr Bld: 5.9 % — ABNORMAL HIGH (ref 4.8–5.6)
Mean Plasma Glucose: 122.63 mg/dL

## 2022-01-07 MED ORDER — ALBUMIN HUMAN 25 % IV SOLN
25.0000 g | Freq: Four times a day (QID) | INTRAVENOUS | Status: AC
  Administered 2022-01-07 – 2022-01-08 (×3): 25 g via INTRAVENOUS
  Filled 2022-01-07 (×3): qty 100

## 2022-01-07 MED ORDER — FUROSEMIDE 10 MG/ML IJ SOLN
80.0000 mg | Freq: Every day | INTRAMUSCULAR | Status: DC
  Administered 2022-01-07 – 2022-01-08 (×2): 80 mg via INTRAVENOUS
  Filled 2022-01-07 (×2): qty 8

## 2022-01-07 MED ORDER — GERHARDT'S BUTT CREAM
TOPICAL_CREAM | Freq: Every day | CUTANEOUS | Status: DC
  Administered 2022-01-18: 1 via TOPICAL
  Filled 2022-01-07 (×6): qty 1

## 2022-01-07 NOTE — Progress Notes (Signed)
PROGRESS NOTE  Patricia Wagner VCB:[id number] DOB: 1951/08/22 DOA: 12/30/2021 PCP: Janith Lima, MD  Brief History   71 year old female who presents with increasing respiratory distress and has a well-documented past medical history of atrial fibrillation, chronic kidney disease, congestive heart failure, rheumatoid arthritis, Raynaud's disease, cardiovascular accident, COPD, urinary retention hypertension hyperlipidemia with known congestive heart failure.  She was trialed on noninvasive mechanical ventilatory support with no improvement of her pH of 7.1 therefore pulmonary critical care was called to the bedside.  She was started on a bicarbonate drip she will be transferred to the intensive care unit and has a low threshold for being intubated.  She may need a central line and/or hemodialysis catheter in the near future.  The patient has been transferred to the hospitalist service for the remainder of her care. Cardiology has been consulted as has nephrology.   On 01/05/2022 the patient was only non-verbally communicative with me. MRI brain was obtained due to this change in neurological status. It has demonstrated multiple tiny scattered acute infarcts. These are consistent with embolic infarcts. Carotid doppler was obtained and was negative. She has been started on eliquis for her atrial fibrillation. Neurology has been consulted.   Consultants  Cardiology Nephrology PCCM Neurology  Procedures  None  Antibiotics   Anti-infectives (From admission, onward)    Start     Dose/Rate Route Frequency Ordered Stop   01/01/22 1400  cefTRIAXone (ROCEPHIN) 1 g in sodium chloride 0.9 % 100 mL IVPB        1 g 200 mL/hr over 30 Minutes Intravenous Every 24 hours 01/01/22 1313 01/07/22 1530      Subjective  The patient is resting comfortably. Today the patient is talking to me. Her speech is improved. No new complaintss. Objective   Vitals:  Vitals:   01/07/22 0800 01/07/22 0858  BP:  (!)  109/52  Pulse:    Resp:    Temp:  97.9 F (36.6 C)  SpO2: 94%     Exam:  Constitutional:  The patient is awake, alert, and oriented x 3. No acute distress. Respiratory:  No increased work of breathing. No wheezes, rales, or rhonchi No tactile fremitus Cardiovascular:  Regular rate and rhythm No murmurs, ectopy, or gallups. No lateral PMI. No thrills. Abdomen:  Abdomen is soft, non-tender, non-distended No hernias, masses, or organomegaly Normoactive bowel sounds.  Musculoskeletal:  No cyanosis or clubbing +1 edema Skin:  No rashes, lesions, ulcers palpation of skin: no induration or nodules Neurologic:  CN 2-12 intact Sensation all 4 extremities intact Dysarthric speech Psychiatric:  Mental status - unable to determine   I have personally reviewed the following:   Today's Data  Vitals  Lab Data  CBC BMP  Micro Data  Urine culture positive for serratia marcescens and klebsiella pneumoniae  Imaging  CXR CT head  MRI brain  Cardiology Data  EKG Echocardiogram  Other Data    Scheduled Meds:  apixaban  5 mg Oral BID   chlorhexidine  15 mL Mouth Rinse BID   Chlorhexidine Gluconate Cloth  6 each Topical Daily   furosemide  80 mg Intravenous Daily   Gerhardt's butt cream   Topical Daily   mouth rinse  15 mL Mouth Rinse q12n4p   mexiletine  200 mg Oral Q12H   midodrine  10 mg Oral TID WC   pantoprazole  40 mg Oral Daily   sodium chloride flush  10-40 mL Intracatheter Q12H   Continuous Infusions:  sodium chloride  Stopped (01/04/22 2119)   sodium chloride     albumin human 25 g (01/07/22 1545)    Principal Problem:   Acute on chronic combined systolic and diastolic CHF (congestive heart failure) (HCC) Active Problems:   CAD in native artery, s/p cardiac cath with non obstructive CAD   PAF (paroxysmal atrial fibrillation) (HCC)   Acute renal failure superimposed on stage 3b chronic kidney disease (HCC)   Slurred speech   Thrombocytopenia  (HCC)   Leukocytosis   Acute renal failure (ARF) ([place])   Acute CVA (cerebrovascular accident) ([place])   LOS: 7 days   A & P  Assessment and Plan: * Acute on chronic combined systolic and diastolic CHF (congestive heart failure) ([place])- (present on admission) Mild to moderate mitral regurgitation Acute hypoxic hypercapnic respiratory failure Hypoxic in the 70s to 80s on room air.  Blood gas showing pH 7.1, PCO2 62, and PO2 79.  Currently stable on BiPAP.  Blood pressure low with systolic in the 57Q.  BNP >4500.  Concern for possible cardiogenic shock, however, extremities warm to touch and lactic acid normal. Chest x-ray showing cardiomegaly and vascular congestion. Echo done September 2022 showing EF 35 to 40%, global hypokinesis, grade 1 diastolic dysfunction, mild to moderate mitral regurgitation. -Continue BiPAP.  Avoiding diuretics at this time due to acute kidney injury and soft blood pressure.  Hold [place] and [place].  Repeat echocardiogram 12/31/2021 has demonstrated EF 45-50% with mildly decreased LV function. There is global hypokinesis. Diastolic function could not be evaluated. The right ventricle is severely enlarged. There is moderately reduced systolic function. There is moderately elevated pulmonary artery systolic pressure. The left atrium is mildly dilated and the right atrum is severely dilated. There is moderate to severe tricuspid valve regurgitation. Compared to LVEF from 08/12/2021 echo the EF is improved and there is less dilation of the LV. The tricuspid insufficiency appears worse.  The patient is now in atrial fibrillation. She has been started on eliquis for anticoagulation.  Acute CVA (cerebrovascular accident) [facility]) On admission the patient was found to have dysarthria. CT head was performed and was negative for acute findings. The patient was transferred to the floor on 01/05/2022. Note from PCCM was that if her speech worsened MRI brain should be performed. When I saw  the patient on 01/05/2022, she was non-verbally communicative with me. MRI brain was obtained. It has demonstrated scattered tine acute infarcts bilaterally consistent with embolic stroke. The patient does have atrial fibrillation. She has been started on eliquis and neurology has been consulted. Echocardiogram performed on 12/31/2021 demonstrated no intracardiac source of emboli. Carotid ultrasound was performed today and has demonstrated bear-normal extracranial vesels with only minimal wall thickening or plaque. Bilateral vertebral arteries demonstrated antegrade flow. Right subclavian artery was not visualized. Normal flow hemodynamic was seen in the left subclavian artery. Neurology has been consulted. I appreciate their assistance.  Acute renal failure (ARF) ([place])- (present on admission) Creatinine upon admission was 5.03. It peaked the following am at 5.11. it is currently 3.88. She has had poor urinary output, although this is improving. This is likely due to ischemic insults and multiple pre-renal insults. She has a history of multiple AKI incidents in the past. Renal US is negative.  Nephrology has been consulted. The patient is receiving lasix at 80 mg IV daily. She has metabolic acidosis due to AKI. She receive sodium bicarbonate in a drip initially. Thsi is now resolved.   Leukocytosis- (present on admission) Resolved.  Mild and likely  reactive.  Chest x-ray not suggestive of pneumonia. -UA consistent with UTI. Urine culture has grown out Klebsiella pneumoniae and Serratia Marcescens. Both are sensitive to Rocephin.  Thrombocytopenia ([place])- (present on admission) Platelet count 53k no increased to 70. Monitor..  No signs of active bleeding.  Slurred speech- (present on admission) Blood ethanol level undetectable.  CT head negative for acute finding.  Unable to assess the patient's speech at this time as she is on BiPAP, no other focal neurodeficit.  Neurology felt that this was likely related  to hypoxia rather than acute CVA. The patient is speaking today, although speech is difficult to understand. Per nursing the patient is irascible and sometimes confused.  MRI brain demonstrates scattered tiny acute infarct. Likely from an embolic source. I have started the patient on eliquis and consulted neurology.  Acute renal failure superimposed on stage 3b chronic kidney disease ([place])- (present on admission) BUN 123, creatinine 3.99(baseline 1.3-1.5).  Patient is on Lasix 80 mg daily. Renal ultrasound is normal. Nephrology is consulted. I appreciate their assistance.  PAF (paroxysmal atrial fibrillation) ([place])- (present on admission) Status post ablation in 2020 and amiodarone was stopped.  Not on Eliquis due to history of recurrent diverticular GI bleeding.  This has been restarted as the risk of further stroke is greater than the risk of bleed from remote diverticular bleed. Remains in sinus rhythm. -Continue to monitor   CAD in native artery, s/p cardiac cath with non obstructive CAD- (present on admission) Elevated troponin likely due to demand ischemia from decompensated heart failure.  Troponin elevated but stable (5916 >5616).  Patient is not endorsing chest pain.  Cardiac catheterization done in 2017 showing mild nonobstructive CAD.  Started on IV heparin in the ED due to concern for possible ACS. -Continue IV heparin, echocardiogram, cardiology consulted. The patient may require dobutamine for RV support. She is unable to tolerate GDMT due to hypotension and CKD. She will be continued on midodrine 10 mg tid. Unna boots are in place.    I have seen and examined this patient myself. I have spent 32 minutes in her evaluation and care.  DVT prophylaxis: SCD's Code Status: Full Code Family Communication: None available Disposition Plan: tbd    Patricia Clewis, Patricia Wagner [facility] Direct contact: see [email or web address]  7PM-7AM contact night coverage as above 01/07/2022, 5:48 PM  LOS: 5  days

## 2022-01-07 NOTE — Progress Notes (Signed)
[name]  Assessment/ Plan: Pt is a 71 y.o. yo female  with a PMH chronic systolic CHF, A-fib not on Eliquis due to history of diverticular GI bleeding, PVCs, CKD stage IIIb, chronic systolic CHF due to nonischemic cardiomyopathy (EF 35 to 40%), rheumatoid arthritis, Raynaud's disease, CVA, COPD, hypertension, hyperlipidemia.  #Acute kidney injury on CKD stage IIIb likely ischemic ATN due to multiple prerenal insult related with the use of diuretics/Lasix, Entresto, Jardiance concomitant with some component of cardiorenal syndrome.  Kidney ultrasound without hydronephrosis.  Urine is cloudy with many bacteria and cells.  ANA, anti-GBM antibody, complements unremarkable, pending ANCA.  She has been getting Lasix intermittently with marginal urine output.  Both BUN and creatinine level stable.  I will continue IV Lasix with the support of albumin to increase diuresis.  No uremic features to warrant urgent dialysis although she already has temporary HD catheter in place.  When I discussed with the patient about dialysis patient said that she does not want dialysis at all.  Given multiple comorbidities and worsening kidney function, I consulted palliative care team for goals of care discussion. Continue with strict ins and out, daily lab.  #Acute hypoxic and hypercapnic respiratory failure: Volume management with diuretics and BiPAP as needed.  #Chronic combined systolic and diastolic CHF: Continue to hold Entresto and Jardiance because of AKI.  Diuretics as above.  I will order IV albumin to augment diuresis.  # UTI: Urine culture is growing Klebsiella and Serratia, currently on ceftriaxone.  #Metabolic acidosis: Improved.  #Anemia of chronic disease/CKD: Hemoglobin at goal.  #Hypotension: She is on midodrine.  Monitor BP.  #Hyperphosphatemia:  Continue renal diet and if no improvement in phosphorus level she may need binders.  #Acute stroke: Seen by  neurology team.  Subjective: Seen and examined.  Urine output is only 625 cc.  Patient reported that her breathing is stable.  Denies chest pain.  No nausea or vomiting.  She does not want dialysis.  Objective Vital signs in last 24 hours: Vitals:   01/06/22 1938 01/07/22 0005 01/07/22 0340 01/07/22 0345  BP: (!) 103/51 (!) 94/56 108/67   Pulse:  77 69   Resp: 20 20 20    Temp: 98.3 F (36.8 C) 97.9 F (36.6 C) 98.4 F (36.9 C)   TempSrc: Oral Oral Oral   SpO2: 90% 99% 100%   Weight:    113.4 kg  Height:       Weight change: 2.087 kg  Intake/Output Summary (Last 24 hours) at 01/07/2022 0850 Last data filed at 01/07/2022 0346 Gross per 24 hour  Intake 434.8 ml  Output 625 ml  Net -190.2 ml        Labs: Basic Metabolic Panel: Recent Labs  Lab 01/05/22 0500 01/06/22 0415 01/07/22 0355  NA 134* 137 137  K 3.3* 3.7 3.7  CL 97* 99 99  CO2 25 26 25   GLUCOSE 102* 111* 92  BUN 123* 115* 112*  CREATININE 3.88* 3.84* 3.88*  CALCIUM 7.8* 8.2* 8.3*  PHOS 6.0* 5.6* 5.3*    Liver Function Tests: Recent Labs  Lab 01/06/22 0415 01/07/22 0355  ALBUMIN 2.1* 2.5*    No results for input(s): LIPASE, AMYLASE in the last 168 hours. No results for input(s): AMMONIA in the last 168 hours. CBC: Recent Labs  Lab 01/03/22 1637 01/04/22 0453 01/05/22 0500 01/06/22 0415 01/07/22 0355  WBC 11.8* 11.3* 10.2 10.2 9.4  NEUTROABS 10.2*  --   --   --   --  HGB 11.9* 11.6* 11.1* 10.9* 10.2*  HCT 37.9 37.1 35.1* 35.3* 31.9*  MCV 90.0 90.3 88.2 89.1 87.6  PLT 56* 53* 53* 60* 70*    Cardiac Enzymes: No results for input(s): CKTOTAL, CKMB, CKMBINDEX, TROPONINI in the last 168 hours. CBG: Recent Labs  Lab 01/02/22 1133 01/02/22 1535 01/02/22 1939 01/02/22 2306 01/03/22 0341  GLUCAP 151* 140* 141* 110* 103*     Iron Studies: No results for input(s): IRON, TIBC, TRANSFERRIN, FERRITIN in the last 72 hours. Studies/Results: MR ANGIO HEAD WO CONTRAST  Result Date:  01/07/2022 CLINICAL DATA:  72 year old female with widely scattered punctate infarcts in the cerebral hemispheres on recent MRI suspicious for embolic event. EXAM: MRA HEAD WITHOUT CONTRAST TECHNIQUE: Angiographic images of the Circle of Willis were acquired using MRA technique without intravenous contrast. COMPARISON:  Brain MRI 01/05/2022. Neck MRA today reported separately. CTA head and neck 11/20/2016. FINDINGS: Anterior circulation: Antegrade flow in the ICA siphons. Bilateral siphon tortuosity and irregularity in keeping with atherosclerosis, but no significant siphon stenosis. Patent carotid termini, MCA and ACA origins. Normal anterior communicating artery. Visible ACA branches appear stable and within normal limits. MCA M1 segments and MCA bi/trifurcations are patent without stenosis. Visible bilateral MCA branches appear stable and within normal limits. Posterior circulation: Antegrade flow in the posterior fossa with chronically dominant distal right vertebral artery. Mild distal vertebral artery tortuosity with no distal vertebral stenosis. Patent vertebrobasilar junction. Tortuous basilar artery without stenosis. Patent bilateral AICA, SCA and PCA origins. Tortuous P1 segments with diminutive or absent posterior communicating arteries. Bilateral PCA branches are within normal limits. Anatomic variants: Dominant right vertebral artery. Other: Left cerebellar encephalomalacia. No intracranial mass effect or ventriculomegaly. IMPRESSION: No intracranial large vessel occlusion or hemodynamically significant stenosis. Generalized arterial tortuosity and evidence of ICA siphon atherosclerosis. Electronically Signed   By: Genevie Ann M.D.   On: 01/07/2022 06:52   MR ANGIO NECK WO CONTRAST  Result Date: 01/07/2022 CLINICAL DATA:  71 year old female with widely scattered punctate infarcts in the cerebral hemispheres on recent MRI suspicious for embolic event. EXAM: MRA NECK WITHOUT CONTRAST TECHNIQUE:  Angiographic images of the neck were acquired using MRA technique without intravenous contrast. Carotid stenosis measurements (when applicable) are obtained utilizing NASCET criteria, using the distal internal carotid diameter as the denominator. COMPARISON:  Brain MRI 01/05/2022.  CTA head and neck 01/22/2016. FINDINGS: 2D and 3D time-of-flight neck MRA. Antegrade flow in the bilateral Common carotid and internal carotid arteries, which have a partially retropharyngeal course bilaterally. Continued antegrade flow in both ICAs to the skull base. Motion artifact at the carotid bifurcations on 2D time-of-flight. No evidence of hemodynamically significant cervical ICA stenosis on 3D time-of-flight images. Antegrade flow in dominant appearing right vertebral artery with no evidence of stenosis. Absent antegrade flow signal in the cervical left vertebral artery, while in 2017 there was no left vertebral artery enhancement until the V3 segment. And once again, the distal left vertebral artery appears to be reconstituted from muscular/ECA branches at the V3 level. Subsequently, both distal vertebral arteries demonstrate antegrade flow signal to the vertebrobasilar junction. IMPRESSION: 1. Partially retropharyngeal carotids with no evidence of stenosis in the neck. 2. Dominant right vertebral artery without stenosis. 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 Electronically Signed   By: Genevie Ann M.D.   On: 01/07/2022 07:04   MR BRAIN WO CONTRAST  Result Date: 01/05/2022 CLINICAL DATA:  Neuro deficit, acute, stroke suspected.  EXAM: MRI HEAD WITHOUT CONTRAST TECHNIQUE: Multiplanar, multiecho pulse sequences of the brain and surrounding structures were obtained without intravenous contrast. COMPARISON:  Head CT 12/30/2021 and MRI 08/06/2018 FINDINGS: Brain: Scattered punctate acute cortical and subcortical infarcts are present in the  frontal lobes, parietal lobes, and superior right occipital lobe. There is also a single punctate acute infarct in the right cerebellar hemisphere. A chronic superior left cerebellar infarct is again noted with associated chronic blood products. There is also a small chronic infarct in the superomedial aspect of the right occipital lobe. Scattered small T2 hyperintensities in the cerebral white matter bilaterally have mildly progressed from the prior MRI and are nonspecific but compatible with mild chronic small vessel ischemic disease. The ventricles are normal in size. No mass, midline shift, or extra-axial fluid collection is identified. A partially empty sella is unchanged from the prior MRI. Vascular: Major intracranial vascular flow voids are preserved. Skull and upper cervical spine: Unremarkable bone marrow signal. Sinuses/Orbits: Unremarkable orbits. Small mucous retention cyst in a left ethmoid air cell. Trace left mastoid fluid. Other: Unchanged small Tornwaldt cyst. IMPRESSION: 1. Scattered punctate acute bilateral cerebral and right cerebellar infarcts suggesting a central embolic source. 2. Mild chronic small vessel ischemic disease with chronic infarcts as above. Electronically Signed   By: Logan Bores M.D.   On: 01/05/2022 18:34   VAS US CAROTID  Result Date: 01/07/2022 Carotid Arterial Duplex Study Patient Name:  Patricia Wagner  Date of Exam:   01/06/2022 Medical Rec #: [id number]     Accession #:    [id number] Date of Birth: 1951/05/22     Patient Gender: F Patient Age:   10 years Exam Location:  [facility] Procedure:      VAS US CAROTID Referring Phys: Karie Kirks --------------------------------------------------------------------------------  Indications:       Carotid artery disease. Risk Factors:      Hypertension, hyperlipidemia, past history of smoking,                    coronary artery disease. Limitations        Today's exam was limited due to Line in right neck with                     bandaging. Comparison Study:  No prior study Performing Technologist: Maudry Mayhew MHA, RDMS, RVT, RDCS  Examination Guidelines: A complete evaluation includes B-mode imaging, spectral Doppler, color Doppler, and power Doppler as needed of all accessible portions of each vessel. Bilateral testing is considered an integral part of a complete examination. Limited examinations for reoccurring indications may be performed as noted.  Right Carotid Findings: +----------+--------+--------+--------+------------------+--------+             PSV cm/s EDV cm/s Stenosis Plaque Description Comments  +----------+--------+--------+--------+------------------+--------+  CCA Prox   82       17                                             +----------+--------+--------+--------+------------------+--------+  CCA Distal 67       14                                             +----------+--------+--------+--------+------------------+--------+  ICA Prox  68       19                                             +----------+--------+--------+--------+------------------+--------+  ICA Distal 22       8                                              +----------+--------+--------+--------+------------------+--------+  ECA        88       14                                             +----------+--------+--------+--------+------------------+--------+ +----------+--------+-------+----------------+-------------------+             PSV cm/s EDV cms Describe         Arm Pressure (mmHG)  +----------+--------+-------+----------------+-------------------+  Subclavian                  Multiphasic, WNL                      +----------+--------+-------+----------------+-------------------+ +---------+--------+--+--------+--+---------+  Vertebral PSV cm/s 67 EDV cm/s 18 Antegrade  +---------+--------+--+--------+--+---------+  Left Carotid Findings: +----------+--------+--------+--------+--------------------------+--------+             PSV cm/s EDV  cm/s Stenosis Plaque Description         Comments  +----------+--------+--------+--------+--------------------------+--------+  CCA Prox   101      29                                                     +----------+--------+--------+--------+--------------------------+--------+  CCA Distal 97       20                                                     +----------+--------+--------+--------+--------------------------+--------+  ICA Prox   89       23                heterogenous and irregular           +----------+--------+--------+--------+--------------------------+--------+  ICA Distal 100      20                                                     +----------+--------+--------+--------+--------------------------+--------+  ECA        95       9                                                      +----------+--------+--------+--------+--------------------------+--------+ +----------+--------+--------+----------------+-------------------+  PSV cm/s EDV cm/s Describe         Arm Pressure (mmHG)  +----------+--------+--------+----------------+-------------------+  Subclavian 127               Multiphasic, WNL                      +----------+--------+--------+----------------+-------------------+ +---------+--------+--+--------+-+---------+  Vertebral PSV cm/s 22 EDV cm/s 7 Antegrade  +---------+--------+--+--------+-+---------+   Summary: Right Carotid: The extracranial vessels were near-normal with only minimal wall                thickening or plaque. Left Carotid: The extracranial vessels were near-normal with only minimal wall               thickening or plaque. Vertebrals:  Bilateral vertebral arteries demonstrate antegrade flow. Subclavians: Right subclavian artery was not visualized. Normal flow              hemodynamics were seen in the left subclavian artery. *See table(s) above for measurements and observations.  Electronically signed by Deitra Mayo MD on 01/07/2022 at 7:52:35 AM.     Final     Medications: Infusions:  sodium chloride Stopped (01/04/22 2119)   sodium chloride     cefTRIAXone (ROCEPHIN)  IV 1 g (01/06/22 1337)    Scheduled Medications:  apixaban  5 mg Oral BID   chlorhexidine  15 mL Mouth Rinse BID   Chlorhexidine Gluconate Cloth  6 each Topical Daily   mouth rinse  15 mL Mouth Rinse q12n4p   mexiletine  200 mg Oral Q12H   midodrine  10 mg Oral TID WC   pantoprazole  40 mg Oral Daily   sodium chloride flush  10-40 mL Intracatheter Q12H    have reviewed scheduled and prn medications.  Physical Exam: General: Not in distress, looks comfortable Heart:RRR, s1s2 nl Lungs:clear b/l, no crackle, no increased work of breathing Abdomen:soft, Non-tender, non-distended Extremities: 1+ pitting edema present Dialysis Access: Temporary HD catheter, right IJ Neurology: Alert awake and following commands. Hayzlee Mcsorley Prasad Tiyanna Larcom 01/07/2022,8:50 AM  LOS: 7 days

## 2022-01-07 NOTE — Progress Notes (Signed)
Physical Therapy Treatment Patient Details Name: Patricia Wagner MRN: [id number] DOB: 09/29/1951 Today's Date: 01/07/2022   History of Present Illness Pt is a 71 y.o. female admitted 12/30/21 with SOB, AMS, slurred speech. Initially admitted as code stroke; head CT 2/6 negative for acute finding; neurology suspects slurred speech related to hypoxia given abscence of other focal neurodeficits. Workup for metabolic encephalopathy, AKI on CKD, severe metabolic/respiratory acidosis requiring bicarbonate infusion and BiPAP, probable UTI. MRI completed 2/12 due to continued slurred speech. MRI revealed scattered punctate acute bilateral cerebral and right cerebellar infarcts suggesting a central embolic source. PMH includes CKD, CHF, RA, COPD, CVA, CAD, HTN, HLD, Raynaud's disease.    PT Comments    Pt admitted with above diagnosis. Pt refused OOB mobility and was only agreeable to exercise in bed and assist with repositioning in bed. Pt was appreciative of PT assisting her to get more comfortable in bed.  Progress slow as pt is self limiting. Will continue and progress pt as able.  Pt currently with functional limitations due to balance and endurance deficits. Pt will benefit from skilled PT to increase their independence and safety with mobility to allow discharge to the venue listed below.      Recommendations for follow up therapy are one component of a multi-disciplinary discharge planning process, led by the attending physician.  Recommendations may be updated based on patient status, additional functional criteria and insurance authorization.  Follow Up Recommendations  Skilled nursing-short term rehab (<3 hours/day) (Pt has been refusing.)     Assistance Recommended at Discharge Frequent or constant Supervision/Assistance  Patient can return home with the following Two people to help with walking and/or transfers;Two people to help with bathing/dressing/bathroom   Equipment Recommendations   Wheelchair (measurements PT);Wheelchair cushion (measurements PT)    Recommendations for Other Services       Precautions / Restrictions Precautions Precautions: Fall;Other (comment) Precaution Comments: central line, chronic foley catheter Restrictions Weight Bearing Restrictions: No     Mobility  Bed Mobility Overal bed mobility: Needs Assistance Bed Mobility: Supine to Sit     Supine to sit: Min guard (supine to long sit)     General bed mobility comments: Pt not agreeable to do anything initially.  Pt did eventually agree to do a few exercises.  She also c/o back hurting therefore pt was able to use rails to pull self into long sitting and PT worked to place pillows at her back to make her more comfortable. On departure, pt was in a seated chair position in bed.    Transfers                   General transfer comment: Adamantly refused EOB/OOB.    Ambulation/Gait                   Stairs             Wheelchair Mobility    Modified Rankin (Stroke Patients Only)       Balance                                            Cognition Arousal/Alertness: Awake/alert Behavior During Therapy: Flat affect, Agitated Overall Cognitive Status: No family/caregiver present to determine baseline cognitive functioning  General Comments: Not very accepting of help/assist. Poor insight into deficits.        Exercises General Exercises - Upper Extremity Shoulder Flexion: AROM, Both, 5 reps, Supine Elbow Flexion: AROM, Both, 5 reps, Supine Elbow Extension: AROM, Both, 5 reps, Supine General Exercises - Lower Extremity Ankle Circles/Pumps: AROM, Both, 5 reps, Supine Quad Sets: AROM, Both, 5 reps, Supine Heel Slides: AAROM, Both, 5 reps, Supine Hip ABduction/ADduction: AROM, Both, 5 reps, Supine    General Comments General comments (skin integrity, edema, etc.): VSS on 3L       Pertinent Vitals/Pain Pain Assessment Pain Assessment: Faces Faces Pain Scale: Hurts whole lot Pain Location: back Pain Descriptors / Indicators: Discomfort, Sore Pain Intervention(s): Limited activity within patient's tolerance, Monitored during session, Repositioned, Patient requesting pain meds-RN notified    Home Living                          Prior Function            PT Goals (current goals can now be found in the care plan section) Acute Rehab PT Goals Patient Stated Goal: home Progress towards PT goals: Not progressing toward goals - comment (refused OOB activity and agreed only to exercise)    Frequency    Min 3X/week      PT Plan Current plan remains appropriate    Co-evaluation              AM-PAC PT "6 Clicks" Mobility   Outcome Measure  Help needed turning from your back to your side while in a flat bed without using bedrails?: A Little Help needed moving from lying on your back to sitting on the side of a flat bed without using bedrails?: Total Help needed moving to and from a bed to a chair (including a wheelchair)?: Total Help needed standing up from a chair using your arms (e.g., wheelchair or bedside chair)?: Total Help needed to walk in hospital room?: Total Help needed climbing 3-5 steps with a railing? : Total 6 Click Score: 8    End of Session Equipment Utilized During Treatment: Gait belt;Oxygen Activity Tolerance: Patient limited by fatigue;Patient limited by pain (self limiting) Patient left: with call bell/phone within reach;in bed;with bed alarm set;with nursing/sitter in room Nurse Communication: Mobility status;Patient requests pain meds;Need for lift equipment PT Visit Diagnosis: Other abnormalities of gait and mobility (R26.89);Muscle weakness (generalized) (M62.81)     Time: 5885-0277 PT Time Calculation (min) (ACUTE ONLY): 10 min  Charges:  $Therapeutic Exercise: 8-22 mins                     Jordain Radin  M,PT Acute Rehab Services [phone] (pager)    Alvira Philips 01/07/2022, 12:12 PM

## 2022-01-07 NOTE — TOC Progression Note (Signed)
Transition of Care [facility]) - Progression Note    Patient Details  Name: MAKENSEY REGO MRN: [id number] Date of Birth: May 19, 1951  Transition of Care [facility]) CM/SW Contact  Bion Todorov, LCSW Phone Number: 01/07/2022, 11:00 AM  Clinical Narrative:    HF CSW attempted to speak with Ms. Plemmons however she reported to be tired and concerned about the shot the nurse was giving her and preferred to talk at a later time. CSW will follow up later today to discuss the discharge plan.   Expected Discharge Plan: [place] Barriers to Discharge: Continued Medical Work up  Expected Discharge Plan and Services Expected Discharge Plan: [place] In-house Referral: Clinical Social Work Discharge Planning Services: CM Consult Post Acute Care Choice: [place] arrangements for the past 2 months: Apartment                                       Social Determinants of Health (SDOH) Interventions Food Insecurity Interventions: Intervention Not Indicated Financial Strain Interventions: Intervention Not Indicated Housing Interventions: Intervention Not Indicated Transportation Interventions: Intervention Not Indicated  Readmission Risk Interventions No flowsheet data found.  Jaydan Meidinger, MSW, LCSW [phone] Heart Failure Social Worker

## 2022-01-07 NOTE — Progress Notes (Addendum)
STROKE TEAM PROGRESS NOTE   INTERVAL HISTORY Patient is seen in her room with no family at the bedside.  She was admitted on 2/7 with respiratory distress and was found to be mute on 2/12.  MRI revealed multiple small and punctate infarcts in bilateral hemispheres.  Neurology was consulted at this time, and patient was started on Eliquis.  Vitals:   01/07/22 0340 01/07/22 0345 01/07/22 0800 01/07/22 0858  BP: 108/67   (!) 109/52  Pulse: 69     Resp: 20     Temp: 98.4 F (36.9 C)   97.9 F (36.6 C)  TempSrc: Oral   Oral  SpO2: 100%  94%   Weight:  113.4 kg    Height:       CBC:  Recent Labs  Lab 01/03/22 1637 01/04/22 0453 01/06/22 0415 01/07/22 0355  WBC 11.8*   < > 10.2 9.4  NEUTROABS 10.2*  --   --   --   HGB 11.9*   < > 10.9* 10.2*  HCT 37.9   < > 35.3* 31.9*  MCV 90.0   < > 89.1 87.6  PLT 56*   < > 60* 70*   < > = values in this interval not displayed.   Basic Metabolic Panel:  Recent Labs  Lab 01/01/22 0643 01/01/22 1449 01/03/22 1637 01/04/22 0453 01/06/22 0415 01/07/22 0355  NA 136   < > 135   < > 137 137  K 2.9*   < > 3.7   < > 3.7 3.7  CL 81*   < > 98   < > 99 99  CO2 44*   < > 25   < > 26 25  GLUCOSE 67*   < > 117*   < > 111* 92  BUN 111*   < > 128*   < > 115* 112*  CREATININE 3.82*   < > 4.49*   < > 3.84* 3.88*  CALCIUM 5.9*   < > 8.1*   < > 8.2* 8.3*  MG 2.1  --  2.7*  --   --   --   PHOS 5.0*  --   --    < > 5.6* 5.3*   < > = values in this interval not displayed.   Lipid Panel:  Recent Labs  Lab 01/07/22 0355  CHOL 84  TRIG 66  HDL 24*  CHOLHDL 3.5  VLDL 13  LDLCALC 47   HgbA1c:  Recent Labs  Lab 01/07/22 0355  HGBA1C 5.9*   Urine Drug Screen: No results for input(s): LABOPIA, COCAINSCRNUR, LABBENZ, AMPHETMU, THCU, LABBARB in the last 168 hours.  Alcohol Level No results for input(s): ETH in the last 168 hours.  IMAGING past 24 hours MR ANGIO HEAD WO CONTRAST  Result Date: 01/07/2022 CLINICAL DATA:  71 year old female with  widely scattered punctate infarcts in the cerebral hemispheres on recent MRI suspicious for embolic event. EXAM: MRA HEAD WITHOUT CONTRAST TECHNIQUE: Angiographic images of the Circle of Willis were acquired using MRA technique without intravenous contrast. COMPARISON:  Brain MRI 01/05/2022. Neck MRA today reported separately. CTA head and neck 11/20/2016. FINDINGS: Anterior circulation: Antegrade flow in the ICA siphons. Bilateral siphon tortuosity and irregularity in keeping with atherosclerosis, but no significant siphon stenosis. Patent carotid termini, MCA and ACA origins. Normal anterior communicating artery. Visible ACA branches appear stable and within normal limits. MCA M1 segments and MCA bi/trifurcations are patent without stenosis. Visible bilateral MCA branches appear stable and within normal limits. Posterior  circulation: Antegrade flow in the posterior fossa with chronically dominant distal right vertebral artery. Mild distal vertebral artery tortuosity with no distal vertebral stenosis. Patent vertebrobasilar junction. Tortuous basilar artery without stenosis. Patent bilateral AICA, SCA and PCA origins. Tortuous P1 segments with diminutive or absent posterior communicating arteries. Bilateral PCA branches are within normal limits. Anatomic variants: Dominant right vertebral artery. Other: Left cerebellar encephalomalacia. No intracranial mass effect or ventriculomegaly. IMPRESSION: No intracranial large vessel occlusion or hemodynamically significant stenosis. Generalized arterial tortuosity and evidence of ICA siphon atherosclerosis. Electronically Signed   By: Genevie Ann M.D.   On: 01/07/2022 06:52   MR ANGIO NECK WO CONTRAST  Result Date: 01/07/2022 CLINICAL DATA:  71 year old female with widely scattered punctate infarcts in the cerebral hemispheres on recent MRI suspicious for embolic event. EXAM: MRA NECK WITHOUT CONTRAST TECHNIQUE: Angiographic images of the neck were acquired using MRA  technique without intravenous contrast. Carotid stenosis measurements (when applicable) are obtained utilizing NASCET criteria, using the distal internal carotid diameter as the denominator. COMPARISON:  Brain MRI 01/05/2022.  CTA head and neck 01/22/2016. FINDINGS: 2D and 3D time-of-flight neck MRA. Antegrade flow in the bilateral Common carotid and internal carotid arteries, which have a partially retropharyngeal course bilaterally. Continued antegrade flow in both ICAs to the skull base. Motion artifact at the carotid bifurcations on 2D time-of-flight. No evidence of hemodynamically significant cervical ICA stenosis on 3D time-of-flight images. Antegrade flow in dominant appearing right vertebral artery with no evidence of stenosis. Absent antegrade flow signal in the cervical left vertebral artery, while in 2017 there was no left vertebral artery enhancement until the V3 segment. And once again, the distal left vertebral artery appears to be reconstituted from muscular/ECA branches at the V3 level. Subsequently, both distal vertebral arteries demonstrate antegrade flow signal to the vertebrobasilar junction. IMPRESSION: 1. Partially retropharyngeal carotids with no evidence of stenosis in the neck. 2. Dominant right vertebral artery without stenosis. 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 Electronically Signed   By: Genevie Ann M.D.   On: 01/07/2022 07:04   VAS US CAROTID  Result Date: 01/07/2022 Carotid Arterial Duplex Study Patient Name:  Patricia Wagner  Date of Exam:   01/06/2022 Medical Rec #: [id number]     Accession #:    [id number] Date of Birth: Mar 26, 1951     Patient Gender: F Patient Age:   71 years Exam Location:  [facility] Procedure:      VAS US CAROTID Referring Phys: Karie Kirks --------------------------------------------------------------------------------  Indications:       Carotid artery disease.  Risk Factors:      Hypertension, hyperlipidemia, past history of smoking,                    coronary artery disease. Limitations        Today's exam was limited due to Line in right neck with                    bandaging. Comparison Study:  No prior study Performing Technologist: Maudry Mayhew MHA, RDMS, RVT, RDCS  Examination Guidelines: A complete evaluation includes B-mode imaging, spectral Doppler, color Doppler, and power Doppler as needed of all accessible portions of each vessel. Bilateral testing is considered an integral part of a complete examination. Limited examinations for reoccurring indications may be performed as noted.  Right Carotid Findings: +----------+--------+--------+--------+------------------+--------+  PSV cm/s EDV cm/s Stenosis Plaque Description Comments  +----------+--------+--------+--------+------------------+--------+  CCA Prox   82       17                                             +----------+--------+--------+--------+------------------+--------+  CCA Distal 67       14                                             +----------+--------+--------+--------+------------------+--------+  ICA Prox   68       19                                             +----------+--------+--------+--------+------------------+--------+  ICA Distal 22       8                                              +----------+--------+--------+--------+------------------+--------+  ECA        88       14                                             +----------+--------+--------+--------+------------------+--------+ +----------+--------+-------+----------------+-------------------+             PSV cm/s EDV cms Describe         Arm Pressure (mmHG)  +----------+--------+-------+----------------+-------------------+  Subclavian                  Multiphasic, WNL                      +----------+--------+-------+----------------+-------------------+ +---------+--------+--+--------+--+---------+   Vertebral PSV cm/s 67 EDV cm/s 18 Antegrade  +---------+--------+--+--------+--+---------+  Left Carotid Findings: +----------+--------+--------+--------+--------------------------+--------+             PSV cm/s EDV cm/s Stenosis Plaque Description         Comments  +----------+--------+--------+--------+--------------------------+--------+  CCA Prox   101      29                                                     +----------+--------+--------+--------+--------------------------+--------+  CCA Distal 97       20                                                     +----------+--------+--------+--------+--------------------------+--------+  ICA Prox   89       23                heterogenous and irregular           +----------+--------+--------+--------+--------------------------+--------+  ICA Distal 100  20                                                     +----------+--------+--------+--------+--------------------------+--------+  ECA        95       9                                                      +----------+--------+--------+--------+--------------------------+--------+ +----------+--------+--------+----------------+-------------------+             PSV cm/s EDV cm/s Describe         Arm Pressure (mmHG)  +----------+--------+--------+----------------+-------------------+  Subclavian 127               Multiphasic, WNL                      +----------+--------+--------+----------------+-------------------+ +---------+--------+--+--------+-+---------+  Vertebral PSV cm/s 22 EDV cm/s 7 Antegrade  +---------+--------+--+--------+-+---------+   Summary: Right Carotid: The extracranial vessels were near-normal with only minimal wall                thickening or plaque. Left Carotid: The extracranial vessels were near-normal with only minimal wall               thickening or plaque. Vertebrals:  Bilateral vertebral arteries demonstrate antegrade flow. Subclavians: Right subclavian artery was not visualized.  Normal flow              hemodynamics were seen in the left subclavian artery. *See table(s) above for measurements and observations.  Electronically signed by Deitra Mayo MD on 01/07/2022 at 7:52:35 AM.    Final     PHYSICAL EXAM General:  Alert, obese pleasant middle-age African-American lady patient in no acute distress   NEURO:  Mental Status: AA&Ox3, able to name 4 animals with 4 feet.  3/3 registration, 0/3 recall Speech/Language: speech no aphasia but mild dysarthria  Cranial Nerves:  II: PERRL. Visual fields full.  III, IV, VI: EOMI. Eyelids elevate symmetrically.  V: Sensation is intact to light touch and symmetrical to face.  VII: Smile is symmetrical.  VIII: hearing intact to voice. IX, X: Phonation is normal.  XII: tongue is midline without fasciculations. Motor: 5/5 strength to all muscle groups tested.  Sensation- Intact to light touch bilaterally. Coordination: FTN intact bilaterally.No drift.  Gait- deferred   ASSESSMENT/PLAN [name] is a 71 y.o. female with history of atrial fibrillation, recurrent GI bleeding, CKD3, CHF with 35-40% EF, nonischemic cardiomyopathy, RA, Raynaud's disease, COPD, HTN and HLD presenting with respiratory distress on 2/7. She was found to be mute on 2/12.  MRI revealed multiple small and punctate infarcts in bilateral hemispheres.  Neurology was consulted at this time, and patient was started on Eliquis.  Stroke:  bilateral multiple small and punctate infarcts  likely secondary due to embolism from atrial fibrillation not on anticoagulation MRI  scattered punctate acute bilateral cerbral and right cerebellar infarcts, mild chronic small vessel ischemic disease MRA  no intracranial LVO or significant stenosis, chronically occluded left vertebral artery Carotid Doppler  minimal thickening or plaque in extracranial vessels 2D Echo EF 45-50%, mildly dilated left atrium, no atrial level shunt LDL 47 HgbA1c  5.9 VTE prophylaxis -  fully anticoagulated with Eliquis    Diet   Diet Heart Room service appropriate? Yes; Fluid consistency: Thin   No antithrombotic prior to admission, now on Eliquis (apixaban) daily.  Therapy recommendations:  SNF Disposition:  pending  Atrial fibrillation Patient has chronic atrial fibrillation Was not on anticoagulation at home due to diverticular bleeds Started Eliquis BID  Hypertension Home meds:  none Stable Permissive hypertension (OK if < 220/120) but gradually normalize in 5-7 days Long-term BP goal normotensive  Hyperlipidemia Home meds:  rosuvastatin 10 mg daily, resumed in hospital LDL 47, goal < 70 Continue statin at discharge  Other Stroke Risk Factors Advanced Age >/= 28  Former cigarette smoker Obesity, Body mass index is 42.91 kg/m., BMI >/= 30 associated with increased stroke risk, recommend weight loss, diet and exercise as appropriate  Congestive heart failure  Other Active Problems CHF Hold Entresto due to rise in creatinine Management per primary team  AKI on CKD Patient does not want dialysis Palliative care consulted  UTI Continue ceftriaxone Hospital day # [place] , MSN, AGACNP-BC [facility] See Amion for schedule and pager information 01/07/2022 1:54 PM   STROKE MD NOTE : I have personally obtained history,examined this patient, reviewed notes, independently viewed imaging studies, participated in medical decision making and plan of care.ROS completed by me personally and pertinent positives fully documented  I have made any additions or clarifications directly to the above note. Agree with note above.  Patient with known atrial fibrillation not on anticoagulation secondary to previous diverticular bleed and developed sudden onset of new nests and speech difficulties appears to be improving now and MRI shows by cerebral embolic infarcts.  Recommend Eliquis for secondary stroke prevention and aggressive risk factor  modification.  She has had previous diverticular bleed but risk of recurrent stroke outweighs small risk of bleeding and patient is agreeable.  Long discussion with patient about stroke evaluation, prevention, risk benefit of anticoagulation for atrial fibrillation and answered questions.  Greater than 50% time during this 50-minute visit spent on counseling and coordination of care about her embolic strokes and discussion about stroke prevention and treatment  Antony Contras, MD Medical Director [facility] Pager: [phone] 01/07/2022 4:15 PM   To contact Stroke Continuity provider, please refer to [email or web address] After hours, contact General Neurology

## 2022-01-07 NOTE — Progress Notes (Signed)
OT Cancellation Note  Patient Details Name: Patricia Wagner MRN: [id number] DOB: Nov 28, 1950   Cancelled Treatment:    Reason Eval/Treat Not Completed: Patient declined, no reason specified Pt reports she "didn't have time" to participate in therapy. When OT asked for clarification, pt reports "Dont start with me". Pt then reporting back pain too much to participate but said she "still wasn't getting out of bed" when RN offered pain medication.   Layla Maw 01/07/2022, 10:18 AM

## 2022-01-08 ENCOUNTER — Other Ambulatory Visit: Payer: Self-pay | Admitting: Internal Medicine

## 2022-01-08 DIAGNOSIS — N179 Acute kidney failure, unspecified: Secondary | ICD-10-CM | POA: Diagnosis not present

## 2022-01-08 DIAGNOSIS — E785 Hyperlipidemia, unspecified: Secondary | ICD-10-CM

## 2022-01-08 DIAGNOSIS — I2510 Atherosclerotic heart disease of native coronary artery without angina pectoris: Secondary | ICD-10-CM

## 2022-01-08 DIAGNOSIS — I639 Cerebral infarction, unspecified: Secondary | ICD-10-CM | POA: Diagnosis not present

## 2022-01-08 DIAGNOSIS — I5043 Acute on chronic combined systolic (congestive) and diastolic (congestive) heart failure: Secondary | ICD-10-CM | POA: Diagnosis not present

## 2022-01-08 DIAGNOSIS — J9601 Acute respiratory failure with hypoxia: Secondary | ICD-10-CM | POA: Diagnosis not present

## 2022-01-08 LAB — RENAL FUNCTION PANEL
Albumin: 3 g/dL — ABNORMAL LOW (ref 3.5–5.0)
Anion gap: 12 (ref 5–15)
BUN: 112 mg/dL — ABNORMAL HIGH (ref 8–23)
CO2: 27 mmol/L (ref 22–32)
Calcium: 8.5 mg/dL — ABNORMAL LOW (ref 8.9–10.3)
Chloride: 100 mmol/L (ref 98–111)
Creatinine, Ser: 3.73 mg/dL — ABNORMAL HIGH (ref 0.44–1.00)
GFR, Estimated: 13 mL/min — ABNORMAL LOW (ref >60)
Glucose, Bld: 94 mg/dL (ref 70–99)
Phosphorus: 5 mg/dL — ABNORMAL HIGH (ref 2.5–4.6)
Potassium: 3.8 mmol/L (ref 3.5–5.1)
Sodium: 139 mmol/L (ref 135–145)

## 2022-01-08 LAB — CBC
HCT: 31.3 % — ABNORMAL LOW (ref 36.0–46.0)
Hemoglobin: 10 g/dL — ABNORMAL LOW (ref 12.0–15.0)
MCH: 27.9 pg (ref 26.0–34.0)
MCHC: 31.9 g/dL (ref 30.0–36.0)
MCV: 87.2 fL (ref 80.0–100.0)
Platelets: 91 10*3/uL — ABNORMAL LOW (ref 150–400)
RBC: 3.59 MIL/uL — ABNORMAL LOW (ref 3.87–5.11)
RDW: 15.5 % (ref 11.5–15.5)
WBC: 9.9 10*3/uL (ref 4.0–10.5)
nRBC: 0.3 % — ABNORMAL HIGH (ref 0.0–0.2)

## 2022-01-08 LAB — COOXEMETRY PANEL
Carboxyhemoglobin: <2.2 % — ABNORMAL HIGH (ref 0.5–1.5)
Methemoglobin: 0.7 % (ref 0.0–1.5)
O2 Saturation: 73.9 %
Total hemoglobin: 10.1 g/dL — ABNORMAL LOW (ref 12.0–16.0)

## 2022-01-08 MED ORDER — METOLAZONE 5 MG PO TABS
5.0000 mg | ORAL_TABLET | Freq: Once | ORAL | Status: AC
  Administered 2022-01-08: 5 mg via ORAL
  Filled 2022-01-08: qty 1

## 2022-01-08 MED ORDER — FUROSEMIDE 10 MG/ML IJ SOLN
80.0000 mg | Freq: Two times a day (BID) | INTRAMUSCULAR | Status: DC
  Administered 2022-01-08 – 2022-01-09 (×3): 80 mg via INTRAVENOUS
  Filled 2022-01-08 (×3): qty 8

## 2022-01-08 NOTE — Progress Notes (Signed)
STROKE TEAM PROGRESS NOTE   INTERVAL HISTORY Patient is seen in her room with no family at the bedside.   Patient is doing well.  She still has mild dysarthria.  She has been started on Eliquis.  She has no complaints.  Vital signs stable.  Vitals:   01/08/22 0401 01/08/22 0700 01/08/22 1100 01/08/22 1103  BP: 115/69 128/61  118/81  Pulse:  91  87  Resp: 20 19  20   Temp: 98.4 F (36.9 C) 98.6 F (37 C)  98.2 F (36.8 C)  TempSrc: Oral Oral  Oral  SpO2: 94% 92% (!) 79% 93%  Weight: 116.2 kg     Height:       CBC:  Recent Labs  Lab 01/03/22 1637 01/04/22 0453 01/07/22 0355 01/08/22 0415  WBC 11.8*   < > 9.4 9.9  NEUTROABS 10.2*  --   --   --   HGB 11.9*   < > 10.2* 10.0*  HCT 37.9   < > 31.9* 31.3*  MCV 90.0   < > 87.6 87.2  PLT 56*   < > 70* 91*   < > = values in this interval not displayed.   Basic Metabolic Panel:  Recent Labs  Lab 01/03/22 1637 01/04/22 0453 01/07/22 0355 01/08/22 0415  NA 135   < > 137 139  K 3.7   < > 3.7 3.8  CL 98   < > 99 100  CO2 25   < > 25 27  GLUCOSE 117*   < > 92 94  BUN 128*   < > 112* 112*  CREATININE 4.49*   < > 3.88* 3.73*  CALCIUM 8.1*   < > 8.3* 8.5*  MG 2.7*  --   --   --   PHOS  --    < > 5.3* 5.0*   < > = values in this interval not displayed.   Lipid Panel:  Recent Labs  Lab 01/07/22 0355  CHOL 84  TRIG 66  HDL 24*  CHOLHDL 3.5  VLDL 13  LDLCALC 47   HgbA1c:  Recent Labs  Lab 01/07/22 0355  HGBA1C 5.9*   Urine Drug Screen: No results for input(s): LABOPIA, COCAINSCRNUR, LABBENZ, AMPHETMU, THCU, LABBARB in the last 168 hours.  Alcohol Level No results for input(s): ETH in the last 168 hours.  IMAGING past 24 hours No results found.  PHYSICAL EXAM General:  Alert, obese pleasant middle-age African-American lady patient in no acute distress   NEURO:  Mental Status: AA&Ox3, able to name 4 animals with 4 feet.  3/3 registration, 0/3 recall Speech/Language: speech no aphasia but mild  dysarthria  Cranial Nerves:  II: PERRL. Visual fields full.  III, IV, VI: EOMI. Eyelids elevate symmetrically.  V: Sensation is intact to light touch and symmetrical to face.  VII: Smile is symmetrical.  VIII: hearing intact to voice. IX, X: Phonation is normal.  XII: tongue is midline without fasciculations. Motor: 5/5 strength to all muscle groups tested.  Sensation- Intact to light touch bilaterally. Coordination: FTN intact bilaterally.No drift.  Gait- deferred   ASSESSMENT/PLAN [name] is a 71 y.o. female with history of atrial fibrillation, recurrent GI bleeding, CKD3, CHF with 35-40% EF, nonischemic cardiomyopathy, RA, Raynaud's disease, COPD, HTN and HLD presenting with respiratory distress on 2/7. She was found to be mute on 2/12.  MRI revealed multiple small and punctate infarcts in bilateral hemispheres.  Neurology was consulted at this time, and patient was started on  Eliquis.  Stroke:  bilateral multiple small and punctate infarcts  likely secondary due to embolism from atrial fibrillation not on anticoagulation MRI  scattered punctate acute bilateral cerbral and right cerebellar infarcts, mild chronic small vessel ischemic disease MRA  no intracranial LVO or significant stenosis, chronically occluded left vertebral artery Carotid Doppler  minimal thickening or plaque in extracranial vessels 2D Echo EF 45-50%, mildly dilated left atrium, no atrial level shunt LDL 47 HgbA1c 5.9 VTE prophylaxis - fully anticoagulated with Eliquis    Diet   Diet Heart Room service appropriate? Yes; Fluid consistency: Thin   No antithrombotic prior to admission, now on Eliquis (apixaban) daily.  Therapy recommendations:  SNF Disposition:  pending  Atrial fibrillation Patient has chronic atrial fibrillation Was not on anticoagulation at home due to diverticular bleeds Started Eliquis BID  Hypertension Home meds:  none Stable Permissive hypertension (OK if < 220/120) but  gradually normalize in 5-7 days Long-term BP goal normotensive  Hyperlipidemia Home meds:  rosuvastatin 10 mg daily, resumed in hospital LDL 47, goal < 70 Continue statin at discharge  Other Stroke Risk Factors Advanced Age >/= 46  Former cigarette smoker Obesity, Body mass index is 43.97 kg/m., BMI >/= 30 associated with increased stroke risk, recommend weight loss, diet and exercise as appropriate  Congestive heart failure  Other Active Problems CHF Hold Entresto due to rise in creatinine Management per primary team  AKI on CKD Patient does not want dialysis Palliative care consulted  UTI Continue ceftriaxone Hospital day # 8   Patient with known atrial fibrillation not on anticoagulation secondary to previous diverticular bleed and developed sudden onset of new onset speech difficulties appears to be improving now and MRI shows bicerebral embolic infarcts.  Recommend Eliquis for secondary stroke prevention and aggressive risk factor modification.  She has had previous diverticular bleed but risk of recurrent stroke outweighs small risk of bleeding and patient is agreeable.  Long discussion with patient about stroke evaluation, prevention, risk benefit of anticoagulation for atrial fibrillation and answered questions.  Greater than 50% time during this 25-minute visit spent on counseling and coordination of care about her embolic strokes and discussion about stroke prevention and treatment Stroke team will sign off.  Kindly call for questions.  Discussed with Dr. Chucky May, MD Medical Director [place] Pager: [phone] 01/08/2022 1:24 PM   To contact Stroke Continuity provider, please refer to [email or web address] After hours, contact General Neurology

## 2022-01-08 NOTE — Plan of Care (Signed)
  Problem: Clinical Measurements: Goal: Respiratory complications will improve Outcome: Progressing Goal: Cardiovascular complication will be avoided Outcome: Progressing   Problem: Activity: Goal: Risk for activity intolerance will decrease Outcome: Progressing   Problem: Coping: Goal: Level of anxiety will decrease Outcome: Progressing   Problem: Elimination: Goal: Will not experience complications related to urinary retention Outcome: Progressing   Problem: Pain Managment: Goal: General experience of comfort will improve Outcome: Progressing   

## 2022-01-08 NOTE — Consult Note (Addendum)
Consultation Note Date: 01/08/2022   Patient Name: Patricia Wagner  DOB: Sep 01, 1951  MRN: [id number]  Age / Sex: 71 y.o., female  PCP: Janith Lima, MD Referring Physician: Domenic Polite, MD  Reason for Consultation: Establishing goals of care "Multiple co-morbidities including renal failure, heading towards dialysis but patient said she doesn't want dialysis"  HPI/Patient Profile: 71 y.o. female  with past medical history of chronic systolic CHF due to nonischemic cardiomyopathy (EF 35-40%), A-fib not on Eliquis due to history of diverticular GI bleeding, PVCs, CKD stage IIIb, rheumatoid arthritis, CVA, COPD, hypertension, and hyperlipidemia. She presented to the emergency department on 12/30/2021 with acute onset of slurred speech and right arm weakness. ABG revealed pH 7.1, PCO2 62, and PO2 79 so she was placed on BiPAP. Creatinine 4.9 (baseline of 1.3 - 1.5). BNP > 4500, high sensitivity troponin 5916 > 5616. Chest x-ray showed cardiomegaly and vascular congestion; no pulmonary edema. CT head negative for acute finding.  Admitted to [facility] with AKI on CKD, acute respiratory failure, and acute on chronic heart failure.   MRI done 2/12 due to continued slurred speech and showed multiple bilateral small and punctate infarcts likely secondary to embolism from afib not an anticoagulation. MRA negative for intracranial LVO.    Clinical Assessment and Goals of Care: I have reviewed medical records including EPIC notes, labs and imaging, and met at bedside with patient to discuss diagnosis, prognosis, GOC, EOL wishes, disposition, and options.  I introduced Palliative Medicine as specialized medical care for people living with serious illness. It focuses on providing relief from the symptoms and stress of a serious illness.   We discussed a brief life review of the patient. She has one daughter, Patricia Wagner, who recently  relocated to [place] for her job. She has 3 grandchildren. I asked patient who she would want to make her medical decisions if she was unable to make these decisions for herself. She states Patricia Wagner would make decisions for her.   Patient lives alone in her home. As far as functional status, she is ambulatory with a cane and independent with her ADLs. Discussed that PT/OT has recommended rehab. Patient is adamant that she is going home. I asked her if she is going to be able to manage at home - she states she won't know until she gets home.   We discussed her current illness and what it means in the larger context of her ongoing co-morbidities. Natural disease trajectory of heart failure and chronic kidney failure was discussed, emphasizing these are non-curable and progressive illnesses that get worse over time.  Patient confirms that she would not want dialysis. She expresses frustration that people keep asking her the same question. She shares that her father was on dialysis and she saw how hard it was for him and does not want to go through this. I asked if she would consider dialysis if the alternative was death, and she replies that Patricia Wagner would make a decision for her at this point.  I attempted to  elicit values and goals of care important to the patient.  Her stated goal is to improve and return home.   The difference between aggressive medical intervention and comfort care was considered in light of the patient's goals of care. We did discuss code status. Encouraged patient to consider DNR/DNI status understanding evidenced based poor outcomes in similar hospitalized patients, as the cause of the arrest is likely associated with chronic/terminal disease rather than a reversible acute cardio-pulmonary event.   Patient is not agreeable to DNR/DNI with understanding that full code includes CPR, defibrillation, ACLS medications, and intubation. She understands  prognosis would be poor, but is clear in  desire to continue full code status.   Discussed the importance of continued conversation with the medical team regarding overall plan of care and treatment options.  Primary decision maker: Patient    SUMMARY OF RECOMMENDATIONS   Continue all current interventions Patient confirms she would not want dialysis, but does not seem to have full insight that at some point she may die without it Full code with ongoing discussion as this does not align with not wanting dialysis Goal of care is to return home PMT will continue to follow  Code Status/Advance Care Planning: Full code  Prognosis:  Unable to determine  Discharge Planning: Goal is home     Primary Diagnoses: Present on Admission:  Acute on chronic combined systolic and diastolic CHF (congestive heart failure) ([place])  CAD in native artery, s/p cardiac cath with non obstructive CAD  Acute renal failure superimposed on stage 3b chronic kidney disease ([place])  Slurred speech  Thrombocytopenia (HCC)  PAF (paroxysmal atrial fibrillation) ([place])  Leukocytosis  Acute renal failure (ARF) ([place])   I have reviewed the medical record, interviewed the patient and family, and examined the patient. The following aspects are pertinent.  Past Medical History:  Diagnosis Date   Acute blood loss anemia 03/05/2018   Acute hypercapnic respiratory failure ([place]) 04/30/2017   Acute renal failure superimposed on chronic kidney disease ([place]) 10/20/2016   Arthritis    hands, knees   Atrial fibrillation ([place])    a. s/p multiple cardioversions; failed tikosyn/sotalol.   Bradycardia 11/20/2016   CAD in native artery, s/p cardiac cath with non obstructive CAD 10/24/2016   Cerebrovascular accident (CVA) due to embolism of left cerebellar artery (HCC)    CHF (congestive heart failure) ([place])    Chronic anticoagulation 03/15/2018   Chronic atrial fibrillation ([place]) 0/60/0459   Chronic systolic (congestive) heart failure ([place]) 03/05/2018   CKD (chronic  kidney disease) stage 3, GFR 30-59 ml/min (HCC)    Coagulopathy ([place]) 03/05/2018   DIVERTICULITIS, HX OF 07/25/2007   Diverticulosis of colon with hemorrhage 07/22/2007   Qualifier: Diagnosis of  By: [name]     DIVERTICULOSIS, COLON 07/22/2007   Dysuria 07/21/2017   Edema, peripheral    a. chronic BLE edema, R>L. Prior trauma from dog attack and accident.   Essential hypertension 07/22/2007   Qualifier: Diagnosis of  By: [name]     History of kidney stones    HLD (hyperlipidemia) 02/03/2008   Qualifier: Diagnosis of  By: [name], James W    HYPERLIPIDEMIA 02/03/2008   Hypersomnia    declines w/u   Hypertension    Hypotension (arterial) 04/30/2017   MENOPAUSAL DISORDER 01/09/2011   Morbid obesity ([place]) 07/22/2007   NICM (nonischemic cardiomyopathy) ([place]) 10/24/2016   PAF (paroxysmal atrial fibrillation) ([place])    Presence of indwelling urinary catheter    supra pubic  cath   Raynaud's syndrome 07/22/2007   Stroke [facility]) 2017   Suspected sleep apnea 05/20/2017   THYROID NODULE, RIGHT 01/04/2010   VITAMIN D DEFICIENCY 01/09/2011   Qualifier: Diagnosis of  By: [name], [name]     Family History  Problem Relation Age of Onset   Asthma Mother    Diabetes Father    Heart disease Father        Died of presumed heart attack - 59s   Lung disease Sister    Heart disease Sister        Twin sister has heart issue, unclear what kind   Healthy Daughter    Thyroid disease Neg Hx    Colon polyps Neg Hx    Esophageal cancer Neg Hx    Pancreatic cancer Neg Hx    Stomach cancer Neg Hx    Scheduled Meds:  apixaban  5 mg Oral BID   chlorhexidine  15 mL Mouth Rinse BID   Chlorhexidine Gluconate Cloth  6 each Topical Daily   furosemide  80 mg Intravenous Q12H   Gerhardt's butt cream   Topical Daily   mouth rinse  15 mL Mouth Rinse q12n4p   metolazone  5 mg Oral Once   mexiletine  200 mg Oral Q12H   midodrine  10 mg Oral TID WC   pantoprazole  40 mg Oral Daily   sodium chloride  flush  10-40 mL Intracatheter Q12H   Continuous Infusions:  sodium chloride Stopped (01/04/22 2119)   sodium chloride     PRN Meds:.sodium chloride, acetaminophen **OR** acetaminophen, heparin, loperamide, sodium chloride flush   Allergies  Allergen Reactions   Ace Inhibitors Palpitations   Review of Systems  Gastrointestinal:  Positive for diarrhea.   Physical Exam Vitals reviewed.  Constitutional:      General: She is not in acute distress.    Appearance: She is obese. She is ill-appearing.  Pulmonary:     Effort: Pulmonary effort is normal.  Neurological:     Mental Status: She is alert and oriented to person, place, and time.    Vital Signs: BP 128/61 (BP Location: Left Arm)    Pulse 91    Temp 98.6 F (37 C) (Oral)    Resp 19    Ht 5' 4"  (1.626 m)    Wt 116.2 kg    SpO2 92%    BMI 43.97 kg/m  Pain Scale: 0-10   Pain Score: 0-No pain   SpO2: SpO2: 92 % O2 Device:SpO2: 92 % O2 Flow Rate: .O2 Flow Rate (L/min): 3 L/min   LBM: Last BM Date : 01/07/22 Baseline Weight: Weight: 98.9 kg Most recent weight: Weight: 116.2 kg      Palliative Assessment/Data: PPS 50%    MDM - High due to: 1 or more chronic illnesses with severe exacerbation, progression, or side effects of treatment OR acute or chronic illness or injury that poses a threat to life or bodily function Review of prior external notes, review of test results, assessment requiring an independent historian Discussion or decision not to resuscitate or to de-escalate care   Signed by: [name], NP   Please contact Palliative Medicine Team phone at [phone] for questions and concerns.  For individual provider: See Shea Evans

## 2022-01-08 NOTE — Progress Notes (Addendum)
Advanced Heart Failure Rounding Note  PCP-Cardiologist: Glori Bickers, MD   Subjective:     R IJ HD catheter placed 02/09  Scr peaked at 5.24, trending down to 3.84.  Suboptimal diuresis yesterday. Weight increasing.  Nephrology increased lasix to 80 mg IV BID + metolazone 5 mg once  Denies dyspnea at rest. Cant get comfortable in bed. Still having dysarthria.  Objective:    116.2 kg Body mass index is 43.97 kg/m.   Vital Signs:   Temp:  [97.9 F (36.6 C)-98.6 F (37 C)] 98.2 F (36.8 C) (02/15 1103) Pulse Rate:  [87-91] 87 (02/15 1103) Resp:  [19-20] 20 (02/15 1103) BP: (101-128)/(61-81) 118/81 (02/15 1103) SpO2:  [79 %-98 %] 93 % (02/15 1103) Weight:  [116.2 kg] 116.2 kg (02/15 0401) Last BM Date : 01/07/22  Weight change: Filed Weights   01/06/22 0026 01/07/22 0345 01/08/22 0401  Weight: 111.3 kg 113.4 kg 116.2 kg    Intake/Output:   Intake/Output Summary (Last 24 hours) at 01/08/2022 1317 Last data filed at 01/08/2022 1100 Gross per 24 hour  Intake 370 ml  Output 825 ml  Net -455 ml      Physical Exam   CVP not working General:  Lying in bed. No distress. HEENT: normal Neck: supple. + JVD. Carotids 2+ bilat; no bruits.  Cor: PMI nondisplaced. Regular rate & rhythm. No rubs, gallops or murmurs. Lungs: clear Abdomen: soft, nontender, nondistended. No hepatosplenomegaly. Extremities: no cyanosis, clubbing, rash, 2+ edema, + UNNA Neuro: alert & orientedx3, cranial nerves grossly intact. moves all 4 extremities w/o difficulty. Affect pleasant    Telemetry   SR 70s, 2-5 PVCs/min  Labs    CBC Recent Labs    01/07/22 0355 01/08/22 0415  WBC 9.4 9.9  HGB 10.2* 10.0*  HCT 31.9* 31.3*  MCV 87.6 87.2  PLT 70* 91*   Basic Metabolic Panel Recent Labs    01/07/22 0355 01/08/22 0415  NA 137 139  K 3.7 3.8  CL 99 100  CO2 25 27  GLUCOSE 92 94  BUN 112* 112*  CREATININE 3.88* 3.73*  CALCIUM 8.3* 8.5*  PHOS 5.3* 5.0*   Liver  Function Tests Recent Labs    01/07/22 0355 01/08/22 0415  ALBUMIN 2.5* 3.0*   No results for input(s): LIPASE, AMYLASE in the last 72 hours. Cardiac Enzymes No results for input(s): CKTOTAL, CKMB, CKMBINDEX, TROPONINI in the last 72 hours.  BNP: BNP (last 3 results) Recent Labs    12/30/21 2342  BNP >4,500.0*    ProBNP (last 3 results) No results for input(s): PROBNP in the last 8760 hours.   D-Dimer No results for input(s): DDIMER in the last 72 hours. Hemoglobin A1C Recent Labs    01/07/22 0355  HGBA1C 5.9*   Fasting Lipid Panel Recent Labs    01/07/22 0355  CHOL 84  HDL 24*  LDLCALC 47  TRIG 66  CHOLHDL 3.5   Thyroid Function Tests No results for input(s): TSH, T4TOTAL, T3FREE, THYROIDAB in the last 72 hours.  Invalid input(s): FREET3  Other results:   Imaging    No results found.   Medications:     Scheduled Medications:  apixaban  5 mg Oral BID   chlorhexidine  15 mL Mouth Rinse BID   Chlorhexidine Gluconate Cloth  6 each Topical Daily   furosemide  80 mg Intravenous Q12H   Gerhardt's butt cream   Topical Daily   mouth rinse  15 mL Mouth Rinse q12n4p   mexiletine  200 mg Oral Q12H   midodrine  10 mg Oral TID WC   pantoprazole  40 mg Oral Daily   sodium chloride flush  10-40 mL Intracatheter Q12H    Infusions:  sodium chloride Stopped (01/04/22 2119)   sodium chloride      PRN Medications: sodium chloride, acetaminophen **OR** acetaminophen, heparin, loperamide, sodium chloride flush    Patient Profile   71 y/o female w/ chronic biventricular heart failure/ nonischemic CM, felt likely tachymediated from Afib/PVCs, Stage IIIb CKD and neurogenic bladder w/ urinary retention w/ chronic foley, admitted w/ acute hypoxic respiratory failure and AKI, in setting of UTI and a/c CHF.   Assessment/Plan   1. Acute on Chronic Biventricular Heart Failure, w/ Predominant RV Failure - NICM, LHC 2017 w/ mild nonobstructive CAD - Echo 6/18  EF 15% in setting of tachy CM from AF - Echo 02/2018: EF 40-45%, grade 1 DD, RV normal - Echo 8/20 45-50% Personally reviewed - Echo 9/22 EF read as 35-40% (I think closer to 30%) - suspect tachymediated CM from Afib and high PVC burden  - Previous Amyloid w/u negative with negative fat pad biopsy and PYP scan (2018).  - Echo this admit, EF 40-45%, RV severely enlarged w/ moderately reduced systolic function  - Now w/ a/c CHF c/b AKI on Stage lllb CKD  - Co-ox 74%  - Scr improving. UOP less brisk last couple of days. Nephrology dosing diuretics. Now on 80 mg furosemide IV BID + 5 mg metolazone Once - Unable to tolerate GDMT w/ hypotension and AKI. Home entresto and jardiance on hold - Continue midodrine 10 tid for BP support, titrate further if needed  - UNNA boots   2. AKI on CKD Stage IIIb - Baseline SCr 1.4-1.6 - Scr peaked at 5.25, down to 3.84 today.  - nephrology following, felt to be ATN + component of cardiorenal syndrome. - Nephrology dosing diuretics as above. She does not want dialysis   3. Acute Hypoxic Respiratory Failure - O2 sats 70-80s on admit, requiring BiPAP  - Flu and Covid negative  - admit CXR showed vascular congestion but diuretics held on admit due to suspected hypovolemic AKI in setting of diarrhea. Now diuresing with IV lasix. - O2 now stable on 3L [place]   4. UTI  - chronic foley for neurogenic bladder w/ urinary retention  - UA + on admit - UCx + Klebsiella Pneumoniae + Serratia Marcescens  - Treating w/ ceftriaxone (sensitive on susceptibilities) per primary team   5. Tricuspid Regurgitation  - trivial TR on echo 9/22 - severe on echo this admit - likely functional, RV severely enlarged    6. PVCs/ NSVT - Zio 7/22 showed 7.0% burden, started on mexiletine (held on admit) - Amio gtt off. Back on mexiletine 200 mg BID. PVCs  burden improved.   7. NSTEMI  - Hs trop 5,916>>5,616>>5,916 - in the setting of acute renal failure  - Denies CP, EKG  nonischemic. LHC 2017 minimal CAD - No plans for LHC    8. PAF - Hx AF ablation - NSR w/ PVCs - Had not been on anticoagulation given h/o recurrent GIBs d/t diverticular disease.  - Now on Eliquis 5 mg BID given embolic CVA this admit. Watch for bleeding. Hgb 10   9. Thrombocytopenia - Plt 98 on admit, down to 50s-60s. Improving last few days. Up to 91. - off heparin, but doubt HIT given plts were low at baseline - suspect 2/2 critical illness   10. Acute  CVA -CT head on 02/06 with old left cerebellar infarct. No acute finding. -Initially thought to be d/t hypoxia.  -MRI brain 02/12 with scattered punctate acute b/l cerebral and right cerebellar infarcts suggesting central embolic source -MRA with no intracranial LVO, chronically occluded left vertebral artery -Neurology following. Felt to be due to afib. Now on anticoagulation.   PT/OT recommending SNF for rehab  Palliative Care on board for [place] discussion  Length of Stay: 8  FINCH, LINDSAY N, PA-C  01/08/2022, 1:17 PM  Advanced Heart Failure Team Pager [phone] (M-F; 7a - 5p)  Please contact [facility] for night-coverage after hours (5p -7a ) and weekends on [email or web address]   Patient seen and examined with the above-signed Advanced Practice Provider and/or Housestaff. I personally reviewed laboratory data, imaging studies and relevant notes. I independently examined the patient and formulated the important aspects of the plan. I have edited the note to reflect any of my changes or salient points. I have personally discussed the plan with the patient and/or family.  Says she feels good. Remains volume overloaded. Unable to check CVP due to clotted IJ cath. Co-ox ok this am. Still with mild dysarthria  General:  lying in bed No resp difficulty HEENT: normal Neck: supple. JVP to jaw RIJ cath . Carotids 2+ bilat; no bruits. No lymphadenopathy or thryomegaly appreciated. Cor: PMI nondisplaced. Regular rate & rhythm. No rubs,  gallops or murmurs. Lungs: clear Abdomen: soft, nontender, nondistended. No hepatosplenomegaly. No bruits or masses. Good bowel sounds. Extremities: no cyanosis, clubbing, rash, 2+ edema Neuro: alert & orientedx3, mild dysrathria  moves all 4 extremities w/o difficulty. Affect pleasant  Remains volume overloaded. Management of diuretics per Renal. Co-ox ok. No need for inotrope support at this time.   Glori Bickers, MD  7:48 PM

## 2022-01-08 NOTE — Progress Notes (Addendum)
Patricia Wagner NEPHROLOGY PROGRESS NOTE  Assessment/ Plan: Pt is a 71 y.o. yo female  with a PMH chronic systolic CHF, A-fib not on Eliquis due to history of diverticular GI bleeding, PVCs, CKD stage IIIb, chronic systolic CHF due to nonischemic cardiomyopathy (EF 35 to 40%), rheumatoid arthritis, Raynaud's disease, CVA, COPD, hypertension, hyperlipidemia.  #Acute kidney injury on CKD stage IIIb likely ischemic ATN due to multiple prerenal insult related with the use of diuretics/Lasix, Entresto, Jardiance concomitant with some component of cardiorenal syndrome.  Kidney ultrasound without hydronephrosis.  Urine is cloudy with many bacteria and cells.  ANA, anti-GBM antibody, ANCA, complements unremarkable.   She has been getting IV Lasix with the support of albumin without much increased urine output.  The creatinine level is stable.  She again confirms with me that she does not want dialysis.  I will increase IV Lasix to twice a day and add.  Palliative care was already consulted to address the goals of care. Continue with strict ins and out, daily lab.  #Acute hypoxic and hypercapnic respiratory failure: Volume management with diuretics and BiPAP as needed.  #Chronic combined systolic and diastolic CHF: Continue to hold Entresto and Jardiance because of AKI.  Diuretics as above.  Cardiology is following.  # UTI: Urine culture is growing Klebsiella and Serratia, currently on ceftriaxone.  #Metabolic acidosis: Improved.  #Anemia of chronic disease/CKD: Hemoglobin at goal.  #Hypotension: She is on midodrine.  Monitor BP.  #Hyperphosphatemia:  Continue renal diet and if no improvement in phosphorus level she may need binders.  #Acute stroke: Seen by neurology team.  I have discussed with the primary team Dr. Broadus John.  Subjective: Seen and examined.  Urine output is recorded only 425 cc.  She denies nausea, vomiting, chest pain, shortness of breath.  She tells me that she does not  want dialysis even when needed.  Objective Vital signs in last 24 hours: Vitals:   01/07/22 1955 01/08/22 0003 01/08/22 0401 01/08/22 0700  BP: 101/65 115/66 115/69 128/61  Pulse:  88  91  Resp: 20 19 20 19   Temp: 97.9 F (36.6 C) 98.3 F (36.8 C) 98.4 F (36.9 C) 98.6 F (37 C)  TempSrc: Oral Oral Oral Oral  SpO2: 98% 96% 94% 92%  Weight:   116.2 kg   Height:       Weight change: 2.8 kg  Intake/Output Summary (Last 24 hours) at 01/08/2022 0849 Last data filed at 01/08/2022 0809 Gross per 24 hour  Intake 850 ml  Output 425 ml  Net 425 ml        Labs: Basic Metabolic Panel: Recent Labs  Lab 01/06/22 0415 01/07/22 0355 01/08/22 0415  NA 137 137 139  K 3.7 3.7 3.8  CL 99 99 100  CO2 26 25 27   GLUCOSE 111* 92 94  BUN 115* 112* 112*  CREATININE 3.84* 3.88* 3.73*  CALCIUM 8.2* 8.3* 8.5*  PHOS 5.6* 5.3* 5.0*    Liver Function Tests: Recent Labs  Lab 01/06/22 0415 01/07/22 0355 01/08/22 0415  ALBUMIN 2.1* 2.5* 3.0*    No results for input(s): LIPASE, AMYLASE in the last 168 hours. No results for input(s): AMMONIA in the last 168 hours. CBC: Recent Labs  Lab 01/03/22 1637 01/04/22 0453 01/05/22 0500 01/06/22 0415 01/07/22 0355 01/08/22 0415  WBC 11.8* 11.3* 10.2 10.2 9.4 9.9  NEUTROABS 10.2*  --   --   --   --   --   HGB 11.9* 11.6* 11.1* 10.9* 10.2* 10.0*  HCT 37.9 37.1 35.1* 35.3* 31.9* 31.3*  MCV 90.0 90.3 88.2 89.1 87.6 87.2  PLT 56* 53* 53* 60* 70* 91*    Cardiac Enzymes: No results for input(s): CKTOTAL, CKMB, CKMBINDEX, TROPONINI in the last 168 hours. CBG: Recent Labs  Lab 01/02/22 1133 01/02/22 1535 01/02/22 1939 01/02/22 2306 01/03/22 0341  GLUCAP 151* 140* 141* 110* 103*     Iron Studies: No results for input(s): IRON, TIBC, TRANSFERRIN, FERRITIN in the last 72 hours. Studies/Results: MR ANGIO HEAD WO CONTRAST  Result Date: 01/07/2022 CLINICAL DATA:  71 year old female with widely scattered punctate infarcts in the  cerebral hemispheres on recent MRI suspicious for embolic event. EXAM: MRA HEAD WITHOUT CONTRAST TECHNIQUE: Angiographic images of the Circle of Willis were acquired using MRA technique without intravenous contrast. COMPARISON:  Brain MRI 01/05/2022. Neck MRA today reported separately. CTA head and neck 11/20/2016. FINDINGS: Anterior circulation: Antegrade flow in the ICA siphons. Bilateral siphon tortuosity and irregularity in keeping with atherosclerosis, but no significant siphon stenosis. Patent carotid termini, MCA and ACA origins. Normal anterior communicating artery. Visible ACA branches appear stable and within normal limits. MCA M1 segments and MCA bi/trifurcations are patent without stenosis. Visible bilateral MCA branches appear stable and within normal limits. Posterior circulation: Antegrade flow in the posterior fossa with chronically dominant distal right vertebral artery. Mild distal vertebral artery tortuosity with no distal vertebral stenosis. Patent vertebrobasilar junction. Tortuous basilar artery without stenosis. Patent bilateral AICA, SCA and PCA origins. Tortuous P1 segments with diminutive or absent posterior communicating arteries. Bilateral PCA branches are within normal limits. Anatomic variants: Dominant right vertebral artery. Other: Left cerebellar encephalomalacia. No intracranial mass effect or ventriculomegaly. IMPRESSION: No intracranial large vessel occlusion or hemodynamically significant stenosis. Generalized arterial tortuosity and evidence of ICA siphon atherosclerosis. Electronically Signed   By: Genevie Ann M.D.   On: 01/07/2022 06:52   MR ANGIO NECK WO CONTRAST  Result Date: 01/07/2022 CLINICAL DATA:  71 year old female with widely scattered punctate infarcts in the cerebral hemispheres on recent MRI suspicious for embolic event. EXAM: MRA NECK WITHOUT CONTRAST TECHNIQUE: Angiographic images of the neck were acquired using MRA technique without intravenous contrast. Carotid  stenosis measurements (when applicable) are obtained utilizing NASCET criteria, using the distal internal carotid diameter as the denominator. COMPARISON:  Brain MRI 01/05/2022.  CTA head and neck 01/22/2016. FINDINGS: 2D and 3D time-of-flight neck MRA. Antegrade flow in the bilateral Common carotid and internal carotid arteries, which have a partially retropharyngeal course bilaterally. Continued antegrade flow in both ICAs to the skull base. Motion artifact at the carotid bifurcations on 2D time-of-flight. No evidence of hemodynamically significant cervical ICA stenosis on 3D time-of-flight images. Antegrade flow in dominant appearing right vertebral artery with no evidence of stenosis. Absent antegrade flow signal in the cervical left vertebral artery, while in 2017 there was no left vertebral artery enhancement until the V3 segment. And once again, the distal left vertebral artery appears to be reconstituted from muscular/ECA branches at the V3 level. Subsequently, both distal vertebral arteries demonstrate antegrade flow signal to the vertebrobasilar junction. IMPRESSION: 1. Partially retropharyngeal carotids with no evidence of stenosis in the neck. 2. Dominant right vertebral artery without stenosis. 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 Electronically Signed   By: Genevie Ann M.D.   On: 01/07/2022 07:04   VAS US CAROTID  Result Date: 01/07/2022 Carotid Arterial Duplex Study Patient Name:  ALANNIS HSIA Rosenberg  Date of  Exam:   01/06/2022 Medical Rec #: [id number]     Accession #:    [id number] Date of Birth: 07-Apr-1951     Patient Gender: F Patient Age:   81 years Exam Location:  [facility] Procedure:      VAS US CAROTID Referring Phys: Karie Kirks --------------------------------------------------------------------------------  Indications:       Carotid artery disease. Risk Factors:      Hypertension, hyperlipidemia,  past history of smoking,                    coronary artery disease. Limitations        Today's exam was limited due to Line in right neck with                    bandaging. Comparison Study:  No prior study Performing Technologist: Maudry Mayhew MHA, RDMS, RVT, RDCS  Examination Guidelines: A complete evaluation includes B-mode imaging, spectral Doppler, color Doppler, and power Doppler as needed of all accessible portions of each vessel. Bilateral testing is considered an integral part of a complete examination. Limited examinations for reoccurring indications may be performed as noted.  Right Carotid Findings: +----------+--------+--------+--------+------------------+--------+             PSV cm/s EDV cm/s Stenosis Plaque Description Comments  +----------+--------+--------+--------+------------------+--------+  CCA Prox   82       17                                             +----------+--------+--------+--------+------------------+--------+  CCA Distal 67       14                                             +----------+--------+--------+--------+------------------+--------+  ICA Prox   68       19                                             +----------+--------+--------+--------+------------------+--------+  ICA Distal 22       8                                              +----------+--------+--------+--------+------------------+--------+  ECA        88       14                                             +----------+--------+--------+--------+------------------+--------+ +----------+--------+-------+----------------+-------------------+             PSV cm/s EDV cms Describe         Arm Pressure (mmHG)  +----------+--------+-------+----------------+-------------------+  Subclavian                  Multiphasic, WNL                      +----------+--------+-------+----------------+-------------------+ +---------+--------+--+--------+--+---------+  Vertebral PSV cm/s 67 EDV  cm/s 18 Antegrade   +---------+--------+--+--------+--+---------+  Left Carotid Findings: +----------+--------+--------+--------+--------------------------+--------+             PSV cm/s EDV cm/s Stenosis Plaque Description         Comments  +----------+--------+--------+--------+--------------------------+--------+  CCA Prox   101      29                                                     +----------+--------+--------+--------+--------------------------+--------+  CCA Distal 97       20                                                     +----------+--------+--------+--------+--------------------------+--------+  ICA Prox   89       23                heterogenous and irregular           +----------+--------+--------+--------+--------------------------+--------+  ICA Distal 100      20                                                     +----------+--------+--------+--------+--------------------------+--------+  ECA        95       9                                                      +----------+--------+--------+--------+--------------------------+--------+ +----------+--------+--------+----------------+-------------------+             PSV cm/s EDV cm/s Describe         Arm Pressure (mmHG)  +----------+--------+--------+----------------+-------------------+  Subclavian 127               Multiphasic, WNL                      +----------+--------+--------+----------------+-------------------+ +---------+--------+--+--------+-+---------+  Vertebral PSV cm/s 22 EDV cm/s 7 Antegrade  +---------+--------+--+--------+-+---------+   Summary: Right Carotid: The extracranial vessels were near-normal with only minimal wall                thickening or plaque. Left Carotid: The extracranial vessels were near-normal with only minimal wall               thickening or plaque. Vertebrals:  Bilateral vertebral arteries demonstrate antegrade flow. Subclavians: Right subclavian artery was not visualized. Normal flow              hemodynamics were  seen in the left subclavian artery. *See table(s) above for measurements and observations.  Electronically signed by Deitra Mayo MD on 01/07/2022 at 7:52:35 AM.    Final     Medications: Infusions:  sodium chloride Stopped (01/04/22 2119)   sodium chloride      Scheduled Medications:  apixaban  5 mg Oral BID   chlorhexidine  15 mL Mouth Rinse BID   Chlorhexidine Gluconate Cloth  6 each Topical  Daily   furosemide  80 mg Intravenous Daily   Gerhardt's butt cream   Topical Daily   mouth rinse  15 mL Mouth Rinse q12n4p   mexiletine  200 mg Oral Q12H   midodrine  10 mg Oral TID WC   pantoprazole  40 mg Oral Daily   sodium chloride flush  10-40 mL Intracatheter Q12H    have reviewed scheduled and prn medications.  Physical Exam: General: Not in distress, looks comfortable Heart:RRR, s1s2 nl Lungs: Basal rhonchi, no increased work of breathing Abdomen:soft, Non-tender, non-distended Extremities: 1+ pitting edema present Dialysis Access: Temporary HD catheter, right IJ Neurology: Alert awake and following commands. Patricia Wagner 01/08/2022,8:49 AM  LOS: 8 days

## 2022-01-08 NOTE — Plan of Care (Signed)
°  Problem: Clinical Measurements: Goal: Respiratory complications will improve 01/08/2022 0152 by [place] Lions, RN Outcome: Progressing 01/08/2022 0151 by [place] Lions, RN Outcome: Progressing Goal: Cardiovascular complication will be avoided 01/08/2022 0152 by [place] Lions, RN Outcome: Progressing 01/08/2022 0151 by [place] Lions, RN Outcome: Progressing   Problem: Coping: Goal: Level of anxiety will decrease 01/08/2022 0152 by [place] Lions, RN Outcome: Progressing 01/08/2022 0151 by [place] Lions, RN Outcome: Progressing   Problem: Elimination: Goal: Will not experience complications related to urinary retention Outcome: Progressing   Problem: Pain Managment: Goal: General experience of comfort will improve 01/08/2022 0152 by [place] Lions, RN Outcome: Progressing 01/08/2022 0151 by [place] Lions, RN Outcome: Progressing   Problem: Safety: Goal: Ability to remain free from injury will improve Outcome: Progressing

## 2022-01-08 NOTE — Discharge Instructions (Signed)

## 2022-01-08 NOTE — Progress Notes (Addendum)
PROGRESS NOTE    Patricia Wagner  QVZ:[id number] DOB: [birth year] DOA: 12/30/2021 PCP: Janith Lima, MD  Narrative 71/F with history of chronic systolic CHF, NICM, EF 32%, A-fib, CKD 3B, history of neurogenic bladder with urinary retention and chronic Foley rheumatoid arthritis, Raynaud's disease, CVA, COPD admitted to the ICU with respiratory distress/fluid overload with AKI, NSTEMI-troponin peaked at 34 UTI,, did not require mechanical ventilation, subsequently transferred to [facility] course was complicated by acute embolic stroke, started on anticoagulation -Heart failure team and nephrology following  Subjective: -Feels restless, some dyspnea -Very clear she would not want hemodialysis  Assessment & Plan:  * Acute on chronic combined systolic and diastolic CHF  Biventricular failure, moderate to severe TR Acute hypoxic hypercapnic respiratory failure -Treated with BiPAP on admission  -echocardiogram 12/31/2021 has demonstrated EF 45-50%, severe RV enlargement with dysfunction  -Complicated by acute kidney injury on CKD 3b -Lasix restarted 80 Mg twice daily with metolazone -Also on midodrine 10 mg 3 times daily -Urine output 425 yesterday,   AKI on CKD 3b Cardiorenal syndrome w/ ischemic ATN secondary to diuretics Lasix Entresto Jardiance etc. -Renal ultrasound negative for hydronephrosis -IV Lasix, was on albumin as well without much urine output -Nephrology following, she reiterates that she will not pursue hemodialysis -Palliative care consulted for goals of care as well  Acute CVA (cerebrovascular accident) [facility]) On admission the patient was found to have dysarthria. CT head was performed and was negative for acute findings - MRI brain noted scattered tine acute infarcts bilaterally consistent with embolic stroke, known history of A-fib, was not on anticoagulation due to history of diverticular bleeds, neurology consulting, now started on Eliquis, MRA negative  for large vessel occlusion, carotid duplex noted minimal plaque in the extracranial vessels, LDL 47, A1c is 5.9 -PT OT eval  NSTEMI CAD in native artery, s/p cardiac cath with non obstructive CAD- (present on admission) - Troponin peaked at 5916 -Previous cardiac catheterization done in 2017 showing mild nonobstructive CAD -Treated with IV heparin initially, no chest pain, no plans for left heart cath in the setting of AKI  UTI -Secondary to chronic Foley -Urine culture has grown out Klebsiella pneumoniae and Serratia Marcescens. Both are sensitive to Rocephin, completed course, will discuss with staff if catheter has been changed  Thrombocytopenia ([place])- (present on admission) -Platelet count trended down to 50 K, now improving, 91 K today  PAF (paroxysmal atrial fibrillation) ([place])- (present on admission) Status post ablation in 2020 and amiodarone was stopped.  Not on Eliquis due to history of recurrent diverticular GI bleeding.  This has been restarted after further discussion regarding risk-benefit analysis  DVT prophylaxis:apixaban Code Status: Full Code Family Communication: none at bedside Disposition Plan: to be determined   Consultants:  Cardiology  Procedures:   Antimicrobials:    Objective: Vitals:   01/08/22 0401 01/08/22 0700 01/08/22 1100 01/08/22 1103  BP: 115/69 128/61  118/81  Pulse:  91  87  Resp: 20 19  20   Temp: 98.4 F (36.9 C) 98.6 F (37 C)  98.2 F (36.8 C)  TempSrc: Oral Oral  Oral  SpO2: 94% 92% (!) 79% 93%  Weight: 116.2 kg     Height:        Intake/Output Summary (Last 24 hours) at 01/08/2022 1439 Last data filed at 01/08/2022 1407 Gross per 24 hour  Intake 370 ml  Output 1100 ml  Net -730 ml   Filed Weights   01/06/22 0026 01/07/22 0345 01/08/22 0401  Weight: 111.3 kg 113.4 kg 116.2 kg    Examination:  General exam: Pleasant elderly chronically ill female sitting up in bed, awake alert oriented x2, mild dysarthria  noted HEENT: Positive JVD, right IJ HD catheter noted CVS: S1-S2, regular rate rhythm Lungs: Clear bilaterally Abdomen: Soft, nontender, bowel sounds present Extremities: 2+ edema, Unna boots on  Skin: No rashes on exposed skin Psychiatry: Judgement and insight appear normal. Mood & affect appropriate.     Data Reviewed:   CBC: Recent Labs  Lab 01/03/22 1637 01/04/22 0453 01/05/22 0500 01/06/22 0415 01/07/22 0355 01/08/22 0415  WBC 11.8* 11.3* 10.2 10.2 9.4 9.9  NEUTROABS 10.2*  --   --   --   --   --   HGB 11.9* 11.6* 11.1* 10.9* 10.2* 10.0*  HCT 37.9 37.1 35.1* 35.3* 31.9* 31.3*  MCV 90.0 90.3 88.2 89.1 87.6 87.2  PLT 56* 53* 53* 60* 70* 91*   Basic Metabolic Panel: Recent Labs  Lab 01/03/22 1637 01/04/22 0453 01/05/22 0500 01/06/22 0415 01/07/22 0355 01/08/22 0415  NA 135 135 134* 137 137 139  K 3.7 3.6 3.3* 3.7 3.7 3.8  CL 98 96* 97* 99 99 100  CO2 25 26 25 26 25 27   GLUCOSE 117* 118* 102* 111* 92 94  BUN 128* 132* 123* 115* 112* 112*  CREATININE 4.49* 4.49* 3.88* 3.84* 3.88* 3.73*  CALCIUM 8.1* 8.1* 7.8* 8.2* 8.3* 8.5*  MG 2.7*  --   --   --   --   --   PHOS  --   --  6.0* 5.6* 5.3* 5.0*   GFR: Estimated Creatinine Clearance: 18.1 mL/min (A) (by C-G formula based on SCr of 3.73 mg/dL (H)). Liver Function Tests: Recent Labs  Lab 01/06/22 0415 01/07/22 0355 01/08/22 0415  ALBUMIN 2.1* 2.5* 3.0*   No results for input(s): LIPASE, AMYLASE in the last 168 hours. No results for input(s): AMMONIA in the last 168 hours. Coagulation Profile: No results for input(s): INR, PROTIME in the last 168 hours. Cardiac Enzymes: No results for input(s): CKTOTAL, CKMB, CKMBINDEX, TROPONINI in the last 168 hours. BNP (last 3 results) No results for input(s): PROBNP in the last 8760 hours. HbA1C: Recent Labs    01/07/22 0355  HGBA1C 5.9*   CBG: Recent Labs  Lab 01/02/22 1133 01/02/22 1535 01/02/22 1939 01/02/22 2306 01/03/22 0341  GLUCAP 151* 140* 141*  110* 103*   Lipid Profile: Recent Labs    01/07/22 0355  CHOL 84  HDL 24*  LDLCALC 47  TRIG 66  CHOLHDL 3.5   Thyroid Function Tests: No results for input(s): TSH, T4TOTAL, FREET4, T3FREE, THYROIDAB in the last 72 hours. Anemia Panel: No results for input(s): VITAMINB12, FOLATE, FERRITIN, TIBC, IRON, RETICCTPCT in the last 72 hours. Urine analysis:    Component Value Date/Time   COLORURINE YELLOW 01/03/2022 1124   APPEARANCEUR CLOUDY (A) 01/03/2022 1124   LABSPEC 1.011 01/03/2022 1124   PHURINE 5.0 01/03/2022 1124   GLUCOSEU NEGATIVE 01/03/2022 1124   GLUCOSEU NEGATIVE 05/22/2021 1435   HGBUR SMALL (A) 01/03/2022 1124   BILIRUBINUR NEGATIVE 01/03/2022 1124   KETONESUR NEGATIVE 01/03/2022 1124   PROTEINUR 30 (A) 01/03/2022 1124   UROBILINOGEN 0.2 05/22/2021 1435   NITRITE NEGATIVE 01/03/2022 1124   LEUKOCYTESUR LARGE (A) 01/03/2022 1124   Sepsis Labs: @LABRCNTIP (procalcitonin:4,lacticidven:4)  ) Recent Results (from the past 240 hour(s))  Resp Panel by RT-PCR (Flu A&B, Covid) Nasopharyngeal Swab     Status: None   Collection Time: 12/30/21 11:42  PM   Specimen: Nasopharyngeal Swab; Nasopharyngeal(NP) swabs in vial transport medium  Result Value Ref Range Status   SARS Coronavirus 2 by RT PCR NEGATIVE NEGATIVE Final    Comment: (NOTE) SARS-CoV-2 target nucleic acids are NOT DETECTED.  The SARS-CoV-2 RNA is generally detectable in upper respiratory specimens during the acute phase of infection. The lowest concentration of SARS-CoV-2 viral copies this assay can detect is 138 copies/mL. A negative result does not preclude SARS-Cov-2 infection and should not be used as the sole basis for treatment or other patient management decisions. A negative result may occur with  improper specimen collection/handling, submission of specimen other than nasopharyngeal swab, presence of viral mutation(s) within the areas targeted by this assay, and inadequate number of  viral copies(<138 copies/mL). A negative result must be combined with clinical observations, patient history, and epidemiological information. The expected result is Negative.  Fact Sheet for Patients:  EntrepreneurPulse.com.au  Fact Sheet for Healthcare Providers:  IncredibleEmployment.be  This test is no t yet approved or cleared by the [place] FDA and  has been authorized for detection and/or diagnosis of SARS-CoV-2 by FDA under an Emergency Use Authorization (EUA). This EUA will remain  in effect (meaning this test can be used) for the duration of the COVID-19 declaration under Section 564(b)(1) of the Act, 21 U.S.C.section 360bbb-3(b)(1), unless the authorization is terminated  or revoked sooner.       Influenza A by PCR NEGATIVE NEGATIVE Final   Influenza B by PCR NEGATIVE NEGATIVE Final    Comment: (NOTE) The Xpert Xpress SARS-CoV-2/FLU/RSV plus assay is intended as an aid in the diagnosis of influenza from Nasopharyngeal swab specimens and should not be used as a sole basis for treatment. Nasal washings and aspirates are unacceptable for Xpert Xpress SARS-CoV-2/FLU/RSV testing.  Fact Sheet for Patients: EntrepreneurPulse.com.au  Fact Sheet for Healthcare Providers: IncredibleEmployment.be  This test is not yet approved or cleared by the [place] FDA and has been authorized for detection and/or diagnosis of SARS-CoV-2 by FDA under an Emergency Use Authorization (EUA). This EUA will remain in effect (meaning this test can be used) for the duration of the COVID-19 declaration under Section 564(b)(1) of the Act, 21 U.S.C. section 360bbb-3(b)(1), unless the authorization is terminated or revoked.  Performed at [facility], [place] [address], [place], [place] [address]   MRSA Next Gen by PCR, Nasal     Status: None   Collection Time: 12/31/21  1:55 PM   Specimen: Nasal Mucosa; Nasal  Swab  Result Value Ref Range Status   MRSA by PCR Next Gen NOT DETECTED NOT DETECTED Final    Comment: (NOTE) The GeneXpert MRSA Assay (FDA approved for NASAL specimens only), is one component of a comprehensive MRSA colonization surveillance program. It is not intended to diagnose MRSA infection nor to guide or monitor treatment for MRSA infections. Test performance is not FDA approved in patients less than 40 years old. Performed at [facility], [place] [address] [place], [place] [address]   Urine Culture     Status: Abnormal   Collection Time: 12/31/21  2:28 PM   Specimen: Urine, Catheterized  Result Value Ref Range Status   Specimen Description URINE, CATHETERIZED  Final   Special Requests   Final    NONE Performed at [facility], [place] [address], [place], [place] [address]    Culture (A)  Final    >=100,000 COLONIES/mL KLEBSIELLA PNEUMONIAE 60,000 COLONIES/mL SERRATIA MARCESCENS  Report Status 01/03/2022 FINAL  Final   Organism ID, Bacteria KLEBSIELLA PNEUMONIAE (A)  Final   Organism ID, Bacteria SERRATIA MARCESCENS (A)  Final      Susceptibility   Klebsiella pneumoniae - MIC*    AMPICILLIN >=32 RESISTANT Resistant     CEFAZOLIN <=4 SENSITIVE Sensitive     CEFEPIME <=0.12 SENSITIVE Sensitive     CEFTRIAXONE <=0.25 SENSITIVE Sensitive     CIPROFLOXACIN <=0.25 SENSITIVE Sensitive     GENTAMICIN <=1 SENSITIVE Sensitive     IMIPENEM <=0.25 SENSITIVE Sensitive     NITROFURANTOIN 128 RESISTANT Resistant     TRIMETH/SULFA <=20 SENSITIVE Sensitive     AMPICILLIN/SULBACTAM 8 SENSITIVE Sensitive     PIP/TAZO <=4 SENSITIVE Sensitive     * >=100,000 COLONIES/mL KLEBSIELLA PNEUMONIAE   Serratia marcescens - MIC*    CEFAZOLIN >=64 RESISTANT Resistant     CEFEPIME <=0.12 SENSITIVE Sensitive     CEFTRIAXONE <=0.25 SENSITIVE Sensitive     CIPROFLOXACIN <=0.25 SENSITIVE Sensitive     GENTAMICIN <=1 SENSITIVE Sensitive     NITROFURANTOIN 256 RESISTANT Resistant      TRIMETH/SULFA <=20 SENSITIVE Sensitive     * 60,000 COLONIES/mL SERRATIA MARCESCENS     Radiology Studies: MR ANGIO HEAD WO CONTRAST  Result Date: 01/07/2022 CLINICAL DATA:  71 year old female with widely scattered punctate infarcts in the cerebral hemispheres on recent MRI suspicious for embolic event. EXAM: MRA HEAD WITHOUT CONTRAST TECHNIQUE: Angiographic images of the Circle of Willis were acquired using MRA technique without intravenous contrast. COMPARISON:  Brain MRI 01/05/2022. Neck MRA today reported separately. CTA head and neck 11/20/2016. FINDINGS: Anterior circulation: Antegrade flow in the ICA siphons. Bilateral siphon tortuosity and irregularity in keeping with atherosclerosis, but no significant siphon stenosis. Patent carotid termini, MCA and ACA origins. Normal anterior communicating artery. Visible ACA branches appear stable and within normal limits. MCA M1 segments and MCA bi/trifurcations are patent without stenosis. Visible bilateral MCA branches appear stable and within normal limits. Posterior circulation: Antegrade flow in the posterior fossa with chronically dominant distal right vertebral artery. Mild distal vertebral artery tortuosity with no distal vertebral stenosis. Patent vertebrobasilar junction. Tortuous basilar artery without stenosis. Patent bilateral AICA, SCA and PCA origins. Tortuous P1 segments with diminutive or absent posterior communicating arteries. Bilateral PCA branches are within normal limits. Anatomic variants: Dominant right vertebral artery. Other: Left cerebellar encephalomalacia. No intracranial mass effect or ventriculomegaly. IMPRESSION: No intracranial large vessel occlusion or hemodynamically significant stenosis. Generalized arterial tortuosity and evidence of ICA siphon atherosclerosis. Electronically Signed   By: Genevie Ann M.D.   On: 01/07/2022 06:52   MR ANGIO NECK WO CONTRAST  Result Date: 01/07/2022 CLINICAL DATA:  71 year old female with widely  scattered punctate infarcts in the cerebral hemispheres on recent MRI suspicious for embolic event. EXAM: MRA NECK WITHOUT CONTRAST TECHNIQUE: Angiographic images of the neck were acquired using MRA technique without intravenous contrast. Carotid stenosis measurements (when applicable) are obtained utilizing NASCET criteria, using the distal internal carotid diameter as the denominator. COMPARISON:  Brain MRI 01/05/2022.  CTA head and neck 01/22/2016. FINDINGS: 2D and 3D time-of-flight neck MRA. Antegrade flow in the bilateral Common carotid and internal carotid arteries, which have a partially retropharyngeal course bilaterally. Continued antegrade flow in both ICAs to the skull base. Motion artifact at the carotid bifurcations on 2D time-of-flight. No evidence of hemodynamically significant cervical ICA stenosis on 3D time-of-flight images. Antegrade flow in dominant appearing right vertebral artery with no evidence of stenosis. Absent antegrade flow  signal in the cervical left vertebral artery, while in 2017 there was no left vertebral artery enhancement until the V3 segment. And once again, the distal left vertebral artery appears to be reconstituted from muscular/ECA branches at the V3 level. Subsequently, both distal vertebral arteries demonstrate antegrade flow signal to the vertebrobasilar junction. IMPRESSION: 1. Partially retropharyngeal carotids with no evidence of stenosis in the neck. 2. Dominant right vertebral artery without stenosis. 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 Electronically Signed   By: Genevie Ann M.D.   On: 01/07/2022 07:04   VAS US CAROTID  Result Date: 01/07/2022 Carotid Arterial Duplex Study Patient Name:  Patricia Wagner  Date of Exam:   01/06/2022 Medical Rec #: [id number]     Accession #:    [id number] Date of Birth: January 13, 1951     Patient Gender: F Patient Age:   62 years Exam Location:  [facility] Procedure:      VAS US CAROTID Referring Phys: Karie Kirks --------------------------------------------------------------------------------  Indications:       Carotid artery disease. Risk Factors:      Hypertension, hyperlipidemia, past history of smoking,                    coronary artery disease. Limitations        Today's exam was limited due to Line in right neck with                    bandaging. Comparison Study:  No prior study Performing Technologist: Maudry Mayhew MHA, RDMS, RVT, RDCS  Examination Guidelines: A complete evaluation includes B-mode imaging, spectral Doppler, color Doppler, and power Doppler as needed of all accessible portions of each vessel. Bilateral testing is considered an integral part of a complete examination. Limited examinations for reoccurring indications may be performed as noted.  Right Carotid Findings: +----------+--------+--------+--------+------------------+--------+             PSV cm/s EDV cm/s Stenosis Plaque Description Comments  +----------+--------+--------+--------+------------------+--------+  CCA Prox   82       17                                             +----------+--------+--------+--------+------------------+--------+  CCA Distal 67       14                                             +----------+--------+--------+--------+------------------+--------+  ICA Prox   68       19                                             +----------+--------+--------+--------+------------------+--------+  ICA Distal 22       8                                              +----------+--------+--------+--------+------------------+--------+  ECA        88  14                                             +----------+--------+--------+--------+------------------+--------+ +----------+--------+-------+----------------+-------------------+             PSV cm/s EDV cms Describe         Arm Pressure (mmHG)   +----------+--------+-------+----------------+-------------------+  Subclavian                  Multiphasic, WNL                      +----------+--------+-------+----------------+-------------------+ +---------+--------+--+--------+--+---------+  Vertebral PSV cm/s 67 EDV cm/s 18 Antegrade  +---------+--------+--+--------+--+---------+  Left Carotid Findings: +----------+--------+--------+--------+--------------------------+--------+             PSV cm/s EDV cm/s Stenosis Plaque Description         Comments  +----------+--------+--------+--------+--------------------------+--------+  CCA Prox   101      29                                                     +----------+--------+--------+--------+--------------------------+--------+  CCA Distal 97       20                                                     +----------+--------+--------+--------+--------------------------+--------+  ICA Prox   89       23                heterogenous and irregular           +----------+--------+--------+--------+--------------------------+--------+  ICA Distal 100      20                                                     +----------+--------+--------+--------+--------------------------+--------+  ECA        95       9                                                      +----------+--------+--------+--------+--------------------------+--------+ +----------+--------+--------+----------------+-------------------+             PSV cm/s EDV cm/s Describe         Arm Pressure (mmHG)  +----------+--------+--------+----------------+-------------------+  Subclavian 127               Multiphasic, WNL                      +----------+--------+--------+----------------+-------------------+ +---------+--------+--+--------+-+---------+  Vertebral PSV cm/s 22 EDV cm/s 7 Antegrade  +---------+--------+--+--------+-+---------+   Summary: Right Carotid: The extracranial vessels were near-normal with only minimal wall                thickening or  plaque. Left Carotid: The extracranial vessels were near-normal with only minimal  wall               thickening or plaque. Vertebrals:  Bilateral vertebral arteries demonstrate antegrade flow. Subclavians: Right subclavian artery was not visualized. Normal flow              hemodynamics were seen in the left subclavian artery. *See table(s) above for measurements and observations.  Electronically signed by Deitra Mayo MD on 01/07/2022 at 7:52:35 AM.    Final      Scheduled Meds:  apixaban  5 mg Oral BID   chlorhexidine  15 mL Mouth Rinse BID   Chlorhexidine Gluconate Cloth  6 each Topical Daily   furosemide  80 mg Intravenous Q12H   Gerhardt's butt cream   Topical Daily   mouth rinse  15 mL Mouth Rinse q12n4p   mexiletine  200 mg Oral Q12H   midodrine  10 mg Oral TID WC   pantoprazole  40 mg Oral Daily   sodium chloride flush  10-40 mL Intracatheter Q12H   Continuous Infusions:  sodium chloride Stopped (01/04/22 2119)   sodium chloride       LOS: 8 days    Time spent: 64min    Domenic Polite, MD [facility]   01/08/2022, 2:39 PM

## 2022-01-08 NOTE — TOC Progression Note (Signed)
Transition of Care [facility]) - Progression Note    Patient Details  Name: GEORJEAN TOYA MRN: [id number] Date of Birth: [birth year]  [facility] CM/SW Contact  Rupinder Livingston, LCSW Phone Number: 01/08/2022, 10:31 AM  Clinical Narrative:    HF CSW spoke with Ms. Wiedman at bedside to discuss again the PT recommendation for SNF at time of discharge. Ms. Hockenbury asked if PT will be by today as she would like to talk with them and reported she is agreeable to whatever is recommended and reported to be in pain and didn't want to talk further.   CSW will continue to follow throughout discharge.   Expected Discharge Plan: [place] Barriers to Discharge: Continued Medical Work up  Expected Discharge Plan and Services Expected Discharge Plan: [place] In-house Referral: Clinical Social Work Discharge Planning Services: CM Consult Post Acute Care Choice: [place] arrangements for the past 2 months: Apartment                                       Social Determinants of Health (SDOH) Interventions Food Insecurity Interventions: Intervention Not Indicated Financial Strain Interventions: Intervention Not Indicated Housing Interventions: Intervention Not Indicated Transportation Interventions: Intervention Not Indicated  Readmission Risk Interventions No flowsheet data found.  Natalie Leclaire, MSW, LCSW [phone] Heart Failure Social Worker

## 2022-01-08 NOTE — Significant Event (Signed)
Patient doctor [name] called for an update. Daughter would like to talk to doctors to talk more about prognosis. Will notify MD.

## 2022-01-09 ENCOUNTER — Ambulatory Visit: Payer: Self-pay

## 2022-01-09 DIAGNOSIS — N390 Urinary tract infection, site not specified: Secondary | ICD-10-CM | POA: Diagnosis present

## 2022-01-09 DIAGNOSIS — I5043 Acute on chronic combined systolic (congestive) and diastolic (congestive) heart failure: Secondary | ICD-10-CM | POA: Diagnosis not present

## 2022-01-09 LAB — COOXEMETRY PANEL
Carboxyhemoglobin: 2.5 % — ABNORMAL HIGH (ref 0.5–1.5)
Methemoglobin: 0.6 % (ref 0.0–1.5)
O2 Saturation: 76.9 %
Total hemoglobin: 10.6 g/dL — ABNORMAL LOW (ref 12.0–16.0)

## 2022-01-09 LAB — CBC
HCT: 34.4 % — ABNORMAL LOW (ref 36.0–46.0)
Hemoglobin: 10.8 g/dL — ABNORMAL LOW (ref 12.0–15.0)
MCH: 27.3 pg (ref 26.0–34.0)
MCHC: 31.4 g/dL (ref 30.0–36.0)
MCV: 87.1 fL (ref 80.0–100.0)
Platelets: 110 10*3/uL — ABNORMAL LOW (ref 150–400)
RBC: 3.95 MIL/uL (ref 3.87–5.11)
RDW: 15.5 % (ref 11.5–15.5)
WBC: 10.7 10*3/uL — ABNORMAL HIGH (ref 4.0–10.5)
nRBC: 0 % (ref 0.0–0.2)

## 2022-01-09 LAB — RENAL FUNCTION PANEL
Albumin: 2.8 g/dL — ABNORMAL LOW (ref 3.5–5.0)
Anion gap: 15 (ref 5–15)
BUN: 118 mg/dL — ABNORMAL HIGH (ref 8–23)
CO2: 25 mmol/L (ref 22–32)
Calcium: 8.6 mg/dL — ABNORMAL LOW (ref 8.9–10.3)
Chloride: 99 mmol/L (ref 98–111)
Creatinine, Ser: 4.03 mg/dL — ABNORMAL HIGH (ref 0.44–1.00)
GFR, Estimated: 12 mL/min — ABNORMAL LOW (ref >60)
Glucose, Bld: 89 mg/dL (ref 70–99)
Phosphorus: 4.8 mg/dL — ABNORMAL HIGH (ref 2.5–4.6)
Potassium: 4.1 mmol/L (ref 3.5–5.1)
Sodium: 139 mmol/L (ref 135–145)

## 2022-01-09 NOTE — Progress Notes (Signed)
PROGRESS NOTE    Patricia Wagner  OZD:[id number] DOB: 1951/08/02 DOA: 12/30/2021 PCP: Janith Lima, MD  Brief Narrative:70/F with history of chronic systolic CHF, NICM, EF 25%, A-fib, CKD 3B, history of neurogenic bladder with urinary retention and chronic Foley rheumatoid arthritis, Raynaud's disease, CVA, COPD admitted to the ICU with respiratory distress/fluid overload with AKI, NSTEMI-troponin peaked at 86 UTI,, did not require mechanical ventilation, subsequently transferred to [facility] course was complicated by acute embolic stroke, started on anticoagulation -Heart failure team and nephrology following   Subjective: -Feels okay overall, some dyspnea with activity  Assessment and Plan:  * Acute on chronic combined systolic and diastolic CHF (congestive heart failure) ([place])- (present on admission) Mild to moderate mitral regurgitation Acute hypoxic hypercapnic respiratory failure Treated with BiPAP on admission  -echocardiogram 12/31/2021 has demonstrated EF 45-50%, severe RV enlargement with dysfunction  -Complicated by acute kidney injury on CKD 3b -Lasix restarted 80 Mg twice daily with metolazone -Also on midodrine 10 mg 3 times daily -Urine output picked up yesterday, 1875, creatinine still 4.0 -Reports that she does not want to pursue hemodialysis, but also wants to remain full code with full aggressive scope of care, has poor insight  Acute renal failure (ARF) ([place])- (present on admission) AKI on CKD 3b Cardiorenal syndrome w/ ischemic ATN secondary to diuretics Lasix Entresto Jardiance etc. -Renal ultrasound negative for hydronephrosis -IV Lasix, was on albumin as well without much urine output -Nephrology following, she reiterates that she will not pursue hemodialysis -Palliative care consulted for goals of care, wants full scope of treatment  Acute CVA (cerebrovascular accident) [facility]) On admission the patient was found to have dysarthria. CT head was  performed and was negative for acute findings - MRI brain noted scattered tine acute infarcts bilaterally consistent with embolic stroke, known history of A-fib, was not on anticoagulation due to history of diverticular bleeds, neurology consulting, now started on Eliquis, MRA negative for large vessel occlusion, carotid duplex noted minimal plaque in the extracranial vessels, LDL 47, A1c is 5.9 -PT OT eval  CAD in native artery, s/p cardiac cath with non obstructive CAD- (present on admission) NSTEMI CAD in native artery, s/p cardiac cath with non obstructive CAD- (present on admission) - Troponin peaked at 5916 -Previous cardiac catheterization done in 2017 showing mild nonobstructive CAD -Treated with IV heparin initially, no chest pain, no plans for left heart cath in the setting of AKI  UTI (urinary tract infection) -Secondary to chronic Foley -Urine culture has grown out Klebsiella pneumoniae and Serratia Marcescens. Both are sensitive to Rocephin, completed course, will discuss with staff if catheter has been changed  PAF (paroxysmal atrial fibrillation) ([place])- (present on admission) Status post ablation in 2020 and amiodarone was stopped.  Not on Eliquis due to history of recurrent diverticular GI bleeding.  This has been restarted after further discussion regarding risk-benefit analysis  Thrombocytopenia ([place])- (present on admission) -Platelet count dropped to 55 K, now improving > 100 K, likely related to critical illness  Acute renal failure superimposed on stage 3b chronic kidney disease ([place])- (present on admission) BUN 123, creatinine 3.99(baseline 1.3-1.5).  Patient is on Lasix 80 mg daily. Renal ultrasound is normal. Nephrology is consulted. I appreciate their assistance.   DVT prophylaxis: Apixaban Code Status: Full code Family Communication: No family at bedside, will update daughter Disposition Plan:   Consultants:  Nephrology, cardiology  Procedures:    Antimicrobials:    Objective: Vitals:   01/08/22 1103 01/08/22 1638 01/08/22 2024  01/09/22 0346  BP: 118/81 106/63 (!) 132/54 115/66  Pulse: 87 76 76 81  Resp: 20 20 19  (!) 21  Temp: 98.2 F (36.8 C) 98.7 F (37.1 C) 99 F (37.2 C) 98.5 F (36.9 C)  TempSrc: Oral Oral Oral Oral  SpO2: 93% 94% 93% 96%  Weight:    111.3 kg  Height:        Intake/Output Summary (Last 24 hours) at 01/09/2022 1446 Last data filed at 01/09/2022 1030 Gross per 24 hour  Intake 360 ml  Output 1600 ml  Net -1240 ml   Filed Weights   01/07/22 0345 01/08/22 0401 01/09/22 0346  Weight: 113.4 kg 116.2 kg 111.3 kg    Examination:  General exam: Obese pleasant female sitting up in bed, appears comfortable, AAOx3 HEENT: Positive JVD, right IJ HD catheter CVS: S1-S2, regular rhythm  Lungs: Decreased breath sounds the bases Abdomen: Soft, nontender, bowel sounds present Extremities: 1+ edema Psychiatry:  Mood & affect appropriate.   Data Reviewed:   CBC: Recent Labs  Lab 01/03/22 1637 01/04/22 0453 01/05/22 0500 01/06/22 0415 01/07/22 0355 01/08/22 0415 01/09/22 0427  WBC 11.8*   < > 10.2 10.2 9.4 9.9 10.7*  NEUTROABS 10.2*  --   --   --   --   --   --   HGB 11.9*   < > 11.1* 10.9* 10.2* 10.0* 10.8*  HCT 37.9   < > 35.1* 35.3* 31.9* 31.3* 34.4*  MCV 90.0   < > 88.2 89.1 87.6 87.2 87.1  PLT 56*   < > 53* 60* 70* 91* 110*   < > = values in this interval not displayed.   Basic Metabolic Panel: Recent Labs  Lab 01/03/22 1637 01/04/22 0453 01/05/22 0500 01/06/22 0415 01/07/22 0355 01/08/22 0415 01/09/22 0427  NA 135   < > 134* 137 137 139 139  K 3.7   < > 3.3* 3.7 3.7 3.8 4.1  CL 98   < > 97* 99 99 100 99  CO2 25   < > 25 26 25 27 25   GLUCOSE 117*   < > 102* 111* 92 94 89  BUN 128*   < > 123* 115* 112* 112* 118*  CREATININE 4.49*   < > 3.88* 3.84* 3.88* 3.73* 4.03*  CALCIUM 8.1*   < > 7.8* 8.2* 8.3* 8.5* 8.6*  MG 2.7*  --   --   --   --   --   --   PHOS  --   --  6.0* 5.6*  5.3* 5.0* 4.8*   < > = values in this interval not displayed.   GFR: Estimated Creatinine Clearance: 16.3 mL/min (A) (by C-G formula based on SCr of 4.03 mg/dL (H)). Liver Function Tests: Recent Labs  Lab 01/06/22 0415 01/07/22 0355 01/08/22 0415 01/09/22 0427  ALBUMIN 2.1* 2.5* 3.0* 2.8*   No results for input(s): LIPASE, AMYLASE in the last 168 hours. No results for input(s): AMMONIA in the last 168 hours. Coagulation Profile: No results for input(s): INR, PROTIME in the last 168 hours. Cardiac Enzymes: No results for input(s): CKTOTAL, CKMB, CKMBINDEX, TROPONINI in the last 168 hours. BNP (last 3 results) No results for input(s): PROBNP in the last 8760 hours. HbA1C: Recent Labs    01/07/22 0355  HGBA1C 5.9*   CBG: Recent Labs  Lab 01/02/22 1535 01/02/22 1939 01/02/22 2306 01/03/22 0341  GLUCAP 140* 141* 110* 103*   Lipid Profile: Recent Labs    01/07/22 0355  CHOL  84  HDL 24*  LDLCALC 47  TRIG 66  CHOLHDL 3.5   Thyroid Function Tests: No results for input(s): TSH, T4TOTAL, FREET4, T3FREE, THYROIDAB in the last 72 hours. Anemia Panel: No results for input(s): VITAMINB12, FOLATE, FERRITIN, TIBC, IRON, RETICCTPCT in the last 72 hours. Urine analysis:    Component Value Date/Time   COLORURINE YELLOW 01/03/2022 1124   APPEARANCEUR CLOUDY (A) 01/03/2022 1124   LABSPEC 1.011 01/03/2022 1124   PHURINE 5.0 01/03/2022 1124   GLUCOSEU NEGATIVE 01/03/2022 1124   GLUCOSEU NEGATIVE 05/22/2021 1435   HGBUR SMALL (A) 01/03/2022 1124   BILIRUBINUR NEGATIVE 01/03/2022 1124   KETONESUR NEGATIVE 01/03/2022 1124   PROTEINUR 30 (A) 01/03/2022 1124   UROBILINOGEN 0.2 05/22/2021 1435   NITRITE NEGATIVE 01/03/2022 1124   LEUKOCYTESUR LARGE (A) 01/03/2022 1124   Sepsis Labs: @LABRCNTIP (procalcitonin:4,lacticidven:4)  ) Recent Results (from the past 240 hour(s))  Resp Panel by RT-PCR (Flu A&B, Covid) Nasopharyngeal Swab     Status: None   Collection Time: 12/30/21  11:42 PM   Specimen: Nasopharyngeal Swab; Nasopharyngeal(NP) swabs in vial transport medium  Result Value Ref Range Status   SARS Coronavirus 2 by RT PCR NEGATIVE NEGATIVE Final    Comment: (NOTE) SARS-CoV-2 target nucleic acids are NOT DETECTED.  The SARS-CoV-2 RNA is generally detectable in upper respiratory specimens during the acute phase of infection. The lowest concentration of SARS-CoV-2 viral copies this assay can detect is 138 copies/mL. A negative result does not preclude SARS-Cov-2 infection and should not be used as the sole basis for treatment or other patient management decisions. A negative result may occur with  improper specimen collection/handling, submission of specimen other than nasopharyngeal swab, presence of viral mutation(s) within the areas targeted by this assay, and inadequate number of viral copies(<138 copies/mL). A negative result must be combined with clinical observations, patient history, and epidemiological information. The expected result is Negative.  Fact Sheet for Patients:  EntrepreneurPulse.com.au  Fact Sheet for Healthcare Providers:  IncredibleEmployment.be  This test is no t yet approved or cleared by the [place] FDA and  has been authorized for detection and/or diagnosis of SARS-CoV-2 by FDA under an Emergency Use Authorization (EUA). This EUA will remain  in effect (meaning this test can be used) for the duration of the COVID-19 declaration under Section 564(b)(1) of the Act, 21 U.S.C.section 360bbb-3(b)(1), unless the authorization is terminated  or revoked sooner.       Influenza A by PCR NEGATIVE NEGATIVE Final   Influenza B by PCR NEGATIVE NEGATIVE Final    Comment: (NOTE) The Xpert Xpress SARS-CoV-2/FLU/RSV plus assay is intended as an aid in the diagnosis of influenza from Nasopharyngeal swab specimens and should not be used as a sole basis for treatment. Nasal washings and aspirates  are unacceptable for Xpert Xpress SARS-CoV-2/FLU/RSV testing.  Fact Sheet for Patients: EntrepreneurPulse.com.au  Fact Sheet for Healthcare Providers: IncredibleEmployment.be  This test is not yet approved or cleared by the [place] FDA and has been authorized for detection and/or diagnosis of SARS-CoV-2 by FDA under an Emergency Use Authorization (EUA). This EUA will remain in effect (meaning this test can be used) for the duration of the COVID-19 declaration under Section 564(b)(1) of the Act, 21 U.S.C. section 360bbb-3(b)(1), unless the authorization is terminated or revoked.  Performed at [facility], [place] [address], [place], [place] [address]   MRSA Next Gen by PCR, Nasal     Status: None   Collection Time: 12/31/21  1:55 PM  Specimen: Nasal Mucosa; Nasal Swab  Result Value Ref Range Status   MRSA by PCR Next Gen NOT DETECTED NOT DETECTED Final    Comment: (NOTE) The GeneXpert MRSA Assay (FDA approved for NASAL specimens only), is one component of a comprehensive MRSA colonization surveillance program. It is not intended to diagnose MRSA infection nor to guide or monitor treatment for MRSA infections. Test performance is not FDA approved in patients less than 43 years old. Performed at [facility], [place] [address], [place], [place] [address]   Urine Culture     Status: Abnormal   Collection Time: 12/31/21  2:28 PM   Specimen: Urine, Catheterized  Result Value Ref Range Status   Specimen Description URINE, CATHETERIZED  Final   Special Requests   Final    NONE Performed at [facility], [place] [address] [place], [place] [address]    Culture (A)  Final    >=100,000 COLONIES/mL KLEBSIELLA PNEUMONIAE 60,000 COLONIES/mL SERRATIA MARCESCENS    Report Status 01/03/2022 FINAL  Final   Organism ID, Bacteria KLEBSIELLA PNEUMONIAE (A)  Final   Organism ID, Bacteria SERRATIA MARCESCENS (A)  Final      Susceptibility    Klebsiella pneumoniae - MIC*    AMPICILLIN >=32 RESISTANT Resistant     CEFAZOLIN <=4 SENSITIVE Sensitive     CEFEPIME <=0.12 SENSITIVE Sensitive     CEFTRIAXONE <=0.25 SENSITIVE Sensitive     CIPROFLOXACIN <=0.25 SENSITIVE Sensitive     GENTAMICIN <=1 SENSITIVE Sensitive     IMIPENEM <=0.25 SENSITIVE Sensitive     NITROFURANTOIN 128 RESISTANT Resistant     TRIMETH/SULFA <=20 SENSITIVE Sensitive     AMPICILLIN/SULBACTAM 8 SENSITIVE Sensitive     PIP/TAZO <=4 SENSITIVE Sensitive     * >=100,000 COLONIES/mL KLEBSIELLA PNEUMONIAE   Serratia marcescens - MIC*    CEFAZOLIN >=64 RESISTANT Resistant     CEFEPIME <=0.12 SENSITIVE Sensitive     CEFTRIAXONE <=0.25 SENSITIVE Sensitive     CIPROFLOXACIN <=0.25 SENSITIVE Sensitive     GENTAMICIN <=1 SENSITIVE Sensitive     NITROFURANTOIN 256 RESISTANT Resistant     TRIMETH/SULFA <=20 SENSITIVE Sensitive     * 60,000 COLONIES/mL SERRATIA MARCESCENS     Radiology Studies: No results found.   Scheduled Meds:  apixaban  5 mg Oral BID   chlorhexidine  15 mL Mouth Rinse BID   Chlorhexidine Gluconate Cloth  6 each Topical Daily   furosemide  80 mg Intravenous Q12H   Gerhardt's butt cream   Topical Daily   mouth rinse  15 mL Mouth Rinse q12n4p   mexiletine  200 mg Oral Q12H   midodrine  10 mg Oral TID WC   pantoprazole  40 mg Oral Daily   sodium chloride flush  10-40 mL Intracatheter Q12H   Continuous Infusions:  sodium chloride Stopped (01/04/22 2119)   sodium chloride       LOS: 9 days    Time spent: 56min  Domenic Polite, MD [facility]   01/09/2022, 2:46 PM

## 2022-01-09 NOTE — Assessment & Plan Note (Addendum)
Related to chronic indwelling foley cathter.  -Urine culture has grown out Klebsiella pneumoniae and Serratia Marcescens. Both are sensitive to Rocephin, completed course Continue change foley evert 4 weeks.

## 2022-01-09 NOTE — Progress Notes (Signed)
Physical Therapy Treatment Patient Details Name: Patricia Wagner MRN: [id number] DOB: [birth year] Today's Date: 01/09/2022   History of Present Illness Pt is a 71 y.o. female admitted 12/30/21 with SOB, AMS, slurred speech. Initially admitted as code stroke; head CT 2/6 negative for acute finding; neurology suspects slurred speech related to hypoxia given abscence of other focal neurodeficits. Workup for metabolic encephalopathy, AKI on CKD, severe metabolic/respiratory acidosis requiring bicarbonate infusion and BiPAP, probable UTI. MRI completed 2/12 due to continued slurred speech. MRI revealed scattered punctate acute bilateral cerebral and right cerebellar infarcts suggesting a central embolic source. PMH includes CKD, CHF, RA, COPD, CVA, CAD, HTN, HLD, Raynaud's disease.    PT Comments    The pt was agreeable to session this morning and asking to get OOB with therapies today. She required minA to complete bed mobility, but up to modA of 2 to stand from EOB with use of stedy.  She was unable to tolerate >5-10 seconds static standing with BUE support, and was unable to progress to stepping at this time. She was able to safely transfer to recliner with use of stedy and +2 assist, will continue to benefit from skilled PT acutely to progress OOB activity tolerance and decrease dependence on caregivers for transfers/mobility.    Recommendations for follow up therapy are one component of a multi-disciplinary discharge planning process, led by the attending physician.  Recommendations may be updated based on patient status, additional functional criteria and insurance authorization.  Follow Up Recommendations  Skilled nursing-short term rehab (<3 hours/day) (Pt has been refusing)     Assistance Recommended at Discharge Frequent or constant Supervision/Assistance  Patient can return home with the following Two people to help with walking and/or transfers;Two people to help with bathing/dressing/bathroom    Equipment Recommendations  Wheelchair (measurements PT);Wheelchair cushion (measurements PT)    Recommendations for Other Services       Precautions / Restrictions Precautions Precautions: Fall;Other (comment) Precaution Comments: central line, chronic foley catheter Restrictions Weight Bearing Restrictions: No     Mobility  Bed Mobility Overal bed mobility: Needs Assistance Bed Mobility: Supine to Sit     Supine to sit: Min assist, HOB elevated     General bed mobility comments: advancing LEs well to EOB. use of bedrail and light handheld assist to pull trunk forward. assist to scoot hips with bed pad    Transfers Overall transfer level: Needs assistance Equipment used: Ambulation equipment used Transfers: Sit to/from Stand, Bed to chair/wheelchair/BSC Sit to Stand: +2 physical assistance, Mod assist           General transfer comment: Able to clear bottom from bed but required modA of 2 to acheive full upright position. Min A to stand from [employer] Resources: Stedy  Ambulation/Gait               General Gait Details: unable to tolerate         Balance Overall balance assessment: Needs assistance Sitting-balance support: No upper extremity supported, Feet supported Sitting balance-Leahy Scale: Fair     Standing balance support: Bilateral upper extremity supported, During functional activity, Reliant on assistive device for balance Standing balance-Leahy Scale: Poor                              Cognition Arousal/Alertness: Awake/alert Behavior During Therapy: Flat affect, Agitated Overall Cognitive Status: No family/caregiver present to determine baseline cognitive functioning  General Comments: more agreeable for OOB attempts, can be easily irritated; contradictory statements and difficulty getting point across at times which can further agitate pt        Exercises       General Comments General comments (skin integrity, edema, etc.): VSS on 2.5 L O2      Pertinent Vitals/Pain Pain Assessment Pain Assessment: Faces Faces Pain Scale: Hurts little more Pain Location: back Pain Descriptors / Indicators: Discomfort, Sore Pain Intervention(s): Monitored during session, Repositioned     PT Goals (current goals can now be found in the care plan section) Acute Rehab PT Goals Patient Stated Goal: home PT Goal Formulation: With patient Time For Goal Achievement: 01/15/22 Potential to Achieve Goals: Fair Progress towards PT goals: Progressing toward goals    Frequency    Min 3X/week      PT Plan Current plan remains appropriate    Co-evaluation PT/OT/SLP Co-Evaluation/Treatment: Yes Reason for Co-Treatment: For patient/therapist safety;To address functional/ADL transfers PT goals addressed during session: Proper use of DME;Strengthening/ROM;Balance        AM-PAC PT "6 Clicks" Mobility   Outcome Measure  Help needed turning from your back to your side while in a flat bed without using bedrails?: A Little Help needed moving from lying on your back to sitting on the side of a flat bed without using bedrails?: A Little Help needed moving to and from a bed to a chair (including a wheelchair)?: Total Help needed standing up from a chair using your arms (e.g., wheelchair or bedside chair)?: Total Help needed to walk in hospital room?: Total Help needed climbing 3-5 steps with a railing? : Total 6 Click Score: 10    End of Session Equipment Utilized During Treatment: Gait belt;Oxygen Activity Tolerance: Patient limited by fatigue (self-limiting) Patient left: in chair;with call bell/phone within reach;with chair alarm set Nurse Communication: Mobility status;Patient requests pain meds;Need for lift equipment PT Visit Diagnosis: Other abnormalities of gait and mobility (R26.89);Muscle weakness (generalized) (M62.81)     Time:  7253-6644 PT Time Calculation (min) (ACUTE ONLY): 34 min  Charges:  $Therapeutic Activity: 8-22 mins                     West Carbo, PT, DPT   Acute Rehabilitation Department Pager #: [phone]   Sandra Cockayne 01/09/2022, 2:01 PM

## 2022-01-09 NOTE — Care Management Important Message (Signed)
Important Message  Patient Details  Name: Patricia Wagner MRN: [id number] Date of Birth: [birth year]   Medicare Important Message Given:  Yes     [name] 01/09/2022, 9:24 AM

## 2022-01-09 NOTE — Progress Notes (Signed)
Occupational Therapy Treatment Patient Details Name: Patricia Wagner MRN: [id number] DOB: 1951-07-08 Today's Date: 01/09/2022   History of present illness Pt is a 71 y.o. female admitted 12/30/21 with SOB, AMS, slurred speech. Initially admitted as code stroke; head CT 2/6 negative for acute finding; neurology suspects slurred speech related to hypoxia given abscence of other focal neurodeficits. Workup for metabolic encephalopathy, AKI on CKD, severe metabolic/respiratory acidosis requiring bicarbonate infusion and BiPAP, probable UTI. MRI completed 2/12 due to continued slurred speech. MRI revealed scattered punctate acute bilateral cerebral and right cerebellar infarcts suggesting a central embolic source. PMH includes CKD, CHF, RA, COPD, CVA, CAD, HTN, HLD, Raynaud's disease.   OT comments  Pt reported desire to get OOB today with pt able to demo Mod A x 2 transfer to chair via Stedy. Pt remains limited by significant weakness, requiring hands on assist to complete all ADLs/transfers. Pt also limited by cognition with decreased insight into medical complexities and the difficulties pt would have if she were to DC home alone. Continue to rec SNF rehab at DC though would recommend 24/7 assist and HHOT if pt continues to refuse SNF rehab.   Recommendations for follow up therapy are one component of a multi-disciplinary discharge planning process, led by the attending physician.  Recommendations may be updated based on patient status, additional functional criteria and insurance authorization.    Follow Up Recommendations  Skilled nursing-short term rehab (<3 hours/day)    Assistance Recommended at Discharge Frequent or constant Supervision/Assistance  Patient can return home with the following  A lot of help with walking and/or transfers;A lot of help with bathing/dressing/bathroom;Assistance with cooking/housework;Direct supervision/assist for medications management;Direct supervision/assist for  financial management;Assist for transportation;Help with stairs or ramp for entrance   Equipment Recommendations  Hospital bed    Recommendations for Other Services      Precautions / Restrictions Precautions Precautions: Fall;Other (comment) Precaution Comments: central line, chronic foley catheter Restrictions Weight Bearing Restrictions: No       Mobility Bed Mobility Overal bed mobility: Needs Assistance Bed Mobility: Supine to Sit     Supine to sit: Min assist, HOB elevated     General bed mobility comments: advancing LEs well to EOB. use of bedrail and light handheld assist to pull trunk forward. assist to scoot hips with bed pad    Transfers Overall transfer level: Needs assistance Equipment used: Ambulation equipment used Transfers: Sit to/from Stand, Bed to chair/wheelchair/BSC Sit to Stand: +2 physical assistance, Mod assist           General transfer comment: Able to clear bottom from bed but required assist to acheive full upright position. Min A to stand from [employer]: [employer] Overall balance assessment: Needs assistance Sitting-balance support: No upper extremity supported, Feet supported Sitting balance-Leahy Scale: Fair     Standing balance support: Bilateral upper extremity supported, During functional activity, Reliant on assistive device for balance Standing balance-Leahy Scale: Poor                             ADL either performed or assessed with clinical judgement   ADL Overall ADL's : Needs assistance/impaired Eating/Feeding: Set up;Sitting Eating/Feeding Details (indicate cue type and reason): requesting assist to open contrainers                         Toileting- Clothing Manipulation and Hygiene: Total assistance  General ADL Comments: Pt reported desire for getting OOB with session focusing on this motivator for pt    Extremity/Trunk Assessment Upper  Extremity Assessment Upper Extremity Assessment: LUE deficits/detail LUE Deficits / Details: questionable shoulder flexion impairment. able to lift 45* towards Stedy today though compensatory movements noted LUE Coordination: decreased gross motor   Lower Extremity Assessment Lower Extremity Assessment: Defer to PT evaluation        Vision   Vision Assessment?: No apparent visual deficits   Perception     Praxis      Cognition Arousal/Alertness: Awake/alert Behavior During Therapy: Flat affect, Agitated Overall Cognitive Status: No family/caregiver present to determine baseline cognitive functioning                                 General Comments: more agreeable for OOB attempts, can be easily irritated; contradictory statements and difficulty getting point across at times which can further agitate pt        Exercises      Shoulder Instructions       General Comments VSS on 2.5 L O2    Pertinent Vitals/ Pain       Pain Assessment Pain Assessment: Faces Faces Pain Scale: Hurts little more Pain Location: back Pain Descriptors / Indicators: Discomfort, Sore Pain Intervention(s): Monitored during session, Limited activity within patient's tolerance, Repositioned  Home Living                                          Prior Functioning/Environment              Frequency  Min 2X/week        Progress Toward Goals  OT Goals(current goals can now be found in the care plan section)  Progress towards OT goals: Progressing toward goals  Acute Rehab OT Goals Patient Stated Goal: go home OT Goal Formulation: With patient Time For Goal Achievement: 01/16/22 Potential to Achieve Goals: Good ADL Goals Pt Will Perform Lower Body Bathing: with mod assist;sit to/from stand;sitting/lateral leans Pt Will Perform Lower Body Dressing: with mod assist;sitting/lateral leans;sit to/from stand Pt Will Transfer to Toilet: with mod  assist;stand pivot transfer;bedside commode Additional ADL Goal #1: Pt to demo bed mobility at Min A in prep for ADL transfers  Plan Discharge plan remains appropriate    Co-evaluation                 AM-PAC OT "6 Clicks" Daily Activity     Outcome Measure   Help from another person eating meals?: A Little Help from another person taking care of personal grooming?: A Little Help from another person toileting, which includes using toliet, bedpan, or urinal?: Total Help from another person bathing (including washing, rinsing, drying)?: A Lot Help from another person to put on and taking off regular upper body clothing?: A Lot Help from another person to put on and taking off regular lower body clothing?: Total 6 Click Score: 12    End of Session Equipment Utilized During Treatment: Gait belt;Oxygen  OT Visit Diagnosis: Unsteadiness on feet (R26.81);Other abnormalities of gait and mobility (R26.89);Muscle weakness (generalized) (M62.81);Other symptoms and signs involving cognitive function   Activity Tolerance Patient tolerated treatment well   Patient Left in chair;with call bell/phone within reach;with chair alarm set   Nurse Communication  Time: 8756-4332 OT Time Calculation (min): 37 min  Charges: OT General Charges $OT Visit: 1 Visit OT Treatments $Therapeutic Activity: 8-22 mins  Malachy Chamber, OTR/L Acute Rehab Services Office: [phone]   Layla Maw 01/09/2022, 12:39 PM

## 2022-01-09 NOTE — TOC Progression Note (Signed)
Transition of Care [facility]) - Progression Note    Patient Details  Name: Patricia Wagner MRN: [id number] Date of Birth: 1951-04-14  [facility] of [facility]) CM/SW Contact  Dane Kopke, LCSW Phone Number: 01/09/2022, 2:23 PM  Clinical Narrative:    HF CSW spoke with [name] at bedside regarding PT's recommendation for SNF at time of discharge. [name] reported she doesn't want to go to rehab and prefers to go home with home health. [name] is adamantly refusing SNF at this time.  CSW will continue to follow throughout discharge.   Expected Discharge Plan: [place] Barriers to Discharge: Continued Medical Work up  Expected Discharge Plan and Services Expected Discharge Plan: [place] In-house Referral: Clinical Social Work Discharge Planning Services: CM Consult Post Acute Care Choice: [place] arrangements for the past 2 months: Apartment                                       Social Determinants of Health (SDOH) Interventions Food Insecurity Interventions: Intervention Not Indicated Financial Strain Interventions: Intervention Not Indicated Housing Interventions: Intervention Not Indicated Transportation Interventions: Intervention Not Indicated  Readmission Risk Interventions No flowsheet data found.  Daichi Moris, MSW, LCSW [phone] Heart Failure Social Worker

## 2022-01-09 NOTE — Progress Notes (Addendum)
[facility] NEPHROLOGY PROGRESS NOTE  Assessment/ Plan: Pt is a 71 y.o. yo female  with a PMH chronic systolic CHF, A-fib not on Eliquis due to history of diverticular GI bleeding, PVCs, CKD stage IIIb, chronic systolic CHF due to nonischemic cardiomyopathy (EF 35 to 40%), rheumatoid arthritis, Raynaud's disease, CVA, COPD, hypertension, hyperlipidemia.  #Acute kidney injury on CKD stage IIIb likely ischemic ATN due to multiple prerenal insult related with the use of diuretics/Lasix, Entresto, Jardiance concomitant with some component of cardiorenal syndrome.  Kidney ultrasound without hydronephrosis.  Urine is cloudy with many bacteria and cells.  ANA, anti-GBM antibody, ANCA, complements unremarkable.   The urine output increased to 1.8 L with IV Lasix and metolazone.  I think she is still volume overload and her weight is higher than on admission.  Although BUN/creatinine level going up, I think she will need diuresis.  I will continue Lasix IV and monitor kidney function.  She does not want dialysis therefore we consulted palliative care to address goals of care.  Fortunately, no urgent need for HD today.  Continue with strict ins and out, daily lab.  #Acute hypoxic and hypercapnic respiratory failure: Volume management with diuretics and BiPAP as needed.  #Chronic combined systolic and diastolic CHF: Continue to hold Entresto and Jardiance because of AKI.  Diuretics as above.  Cardiology is following.  # UTI: Urine culture is growing Klebsiella and Serratia, treated with ceftriaxone.  #Metabolic acidosis: Improved.  #Anemia of chronic disease/CKD: Hemoglobin at goal.  #Hypotension: She is on midodrine.  Monitor BP.  #Hyperphosphatemia:  Continue renal diet and if no improvement in phosphorus level she may need binders.  #Acute stroke: Seen by neurology team.  I have discussed with the primary team Dr. Broadus John.  Subjective: Seen and examined.  The urine output is recorded  around 1.8 L.  She denies nausea, vomiting, dysgeusia, chest pain.  Some dysarthria noted.  Objective Vital signs in last 24 hours: Vitals:   01/08/22 1103 01/08/22 1638 01/08/22 2024 01/09/22 0346  BP: 118/81 106/63 (!) 132/54 115/66  Pulse: 87 76 76 81  Resp: 20 20 19  (!) 21  Temp: 98.2 F (36.8 C) 98.7 F (37.1 C) 99 F (37.2 C) 98.5 F (36.9 C)  TempSrc: Oral Oral Oral Oral  SpO2: 93% 94% 93% 96%  Weight:    111.3 kg  Height:       Weight change: -4.9 kg  Intake/Output Summary (Last 24 hours) at 01/09/2022 0848 Last data filed at 01/09/2022 0500 Gross per 24 hour  Intake 120 ml  Output 1875 ml  Net -1755 ml        Labs: Basic Metabolic Panel: Recent Labs  Lab 01/07/22 0355 01/08/22 0415 01/09/22 0427  NA 137 139 139  K 3.7 3.8 4.1  CL 99 100 99  CO2 25 27 25   GLUCOSE 92 94 89  BUN 112* 112* 118*  CREATININE 3.88* 3.73* 4.03*  CALCIUM 8.3* 8.5* 8.6*  PHOS 5.3* 5.0* 4.8*    Liver Function Tests: Recent Labs  Lab 01/07/22 0355 01/08/22 0415 01/09/22 0427  ALBUMIN 2.5* 3.0* 2.8*    No results for input(s): LIPASE, AMYLASE in the last 168 hours. No results for input(s): AMMONIA in the last 168 hours. CBC: Recent Labs  Lab 01/03/22 1637 01/04/22 0453 01/05/22 0500 01/06/22 0415 01/07/22 0355 01/08/22 0415 01/09/22 0427  WBC 11.8*   < > 10.2 10.2 9.4 9.9 10.7*  NEUTROABS 10.2*  --   --   --   --   --   --  HGB 11.9*   < > 11.1* 10.9* 10.2* 10.0* 10.8*  HCT 37.9   < > 35.1* 35.3* 31.9* 31.3* 34.4*  MCV 90.0   < > 88.2 89.1 87.6 87.2 87.1  PLT 56*   < > 53* 60* 70* 91* 110*   < > = values in this interval not displayed.    Cardiac Enzymes: No results for input(s): CKTOTAL, CKMB, CKMBINDEX, TROPONINI in the last 168 hours. CBG: Recent Labs  Lab 01/02/22 1133 01/02/22 1535 01/02/22 1939 01/02/22 2306 01/03/22 0341  GLUCAP 151* 140* 141* 110* 103*     Iron Studies: No results for input(s): IRON, TIBC, TRANSFERRIN, FERRITIN in the  last 72 hours. Studies/Results: No results found.  Medications: Infusions:  sodium chloride Stopped (01/04/22 2119)   sodium chloride      Scheduled Medications:  apixaban  5 mg Oral BID   chlorhexidine  15 mL Mouth Rinse BID   Chlorhexidine Gluconate Cloth  6 each Topical Daily   furosemide  80 mg Intravenous Q12H   Gerhardt's butt cream   Topical Daily   mouth rinse  15 mL Mouth Rinse q12n4p   mexiletine  200 mg Oral Q12H   midodrine  10 mg Oral TID WC   pantoprazole  40 mg Oral Daily   sodium chloride flush  10-40 mL Intracatheter Q12H    have reviewed scheduled and prn medications.  Physical Exam: General: Dysarthria, not in distress Heart:RRR, s1s2 nl Lungs: Basal rhonchi and mildly increased work of breathing Abdomen:soft, Non-tender, non-distended Extremities: 1+ pitting edema present, not much change Dialysis Access: Temporary HD catheter, right IJ Neurology: Alert awake and following commands. Sharilyn Geisinger Prasad Callahan Peddie 01/09/2022,8:48 AM  LOS: 9 days

## 2022-01-10 DIAGNOSIS — I5043 Acute on chronic combined systolic (congestive) and diastolic (congestive) heart failure: Secondary | ICD-10-CM | POA: Diagnosis not present

## 2022-01-10 DIAGNOSIS — N170 Acute kidney failure with tubular necrosis: Secondary | ICD-10-CM | POA: Diagnosis not present

## 2022-01-10 DIAGNOSIS — J9601 Acute respiratory failure with hypoxia: Secondary | ICD-10-CM | POA: Diagnosis not present

## 2022-01-10 DIAGNOSIS — N179 Acute kidney failure, unspecified: Secondary | ICD-10-CM | POA: Diagnosis not present

## 2022-01-10 LAB — RENAL FUNCTION PANEL
Albumin: 2.4 g/dL — ABNORMAL LOW (ref 3.5–5.0)
Anion gap: 10 (ref 5–15)
BUN: 114 mg/dL — ABNORMAL HIGH (ref 8–23)
CO2: 31 mmol/L (ref 22–32)
Calcium: 8.4 mg/dL — ABNORMAL LOW (ref 8.9–10.3)
Chloride: 101 mmol/L (ref 98–111)
Creatinine, Ser: 3.71 mg/dL — ABNORMAL HIGH (ref 0.44–1.00)
GFR, Estimated: 13 mL/min — ABNORMAL LOW (ref >60)
Glucose, Bld: 98 mg/dL (ref 70–99)
Phosphorus: 4.8 mg/dL — ABNORMAL HIGH (ref 2.5–4.6)
Potassium: 3.3 mmol/L — ABNORMAL LOW (ref 3.5–5.1)
Sodium: 142 mmol/L (ref 135–145)

## 2022-01-10 LAB — COOXEMETRY PANEL
Carboxyhemoglobin: 2.5 % — ABNORMAL HIGH (ref 0.5–1.5)
Methemoglobin: <0.7 % (ref 0.0–1.5)
O2 Saturation: 78.8 %
Total hemoglobin: 10.4 g/dL — ABNORMAL LOW (ref 12.0–16.0)

## 2022-01-10 LAB — CBC
HCT: 32.9 % — ABNORMAL LOW (ref 36.0–46.0)
Hemoglobin: 9.8 g/dL — ABNORMAL LOW (ref 12.0–15.0)
MCH: 26.8 pg (ref 26.0–34.0)
MCHC: 29.8 g/dL — ABNORMAL LOW (ref 30.0–36.0)
MCV: 90.1 fL (ref 80.0–100.0)
Platelets: 165 10*3/uL (ref 150–400)
RBC: 3.65 MIL/uL — ABNORMAL LOW (ref 3.87–5.11)
RDW: 15.7 % — ABNORMAL HIGH (ref 11.5–15.5)
WBC: 9.3 10*3/uL (ref 4.0–10.5)
nRBC: 0 % (ref 0.0–0.2)

## 2022-01-10 MED ORDER — POTASSIUM CHLORIDE CRYS ER 20 MEQ PO TBCR
40.0000 meq | EXTENDED_RELEASE_TABLET | Freq: Once | ORAL | Status: AC
  Administered 2022-01-10: 40 meq via ORAL
  Filled 2022-01-10: qty 2

## 2022-01-10 MED ORDER — FUROSEMIDE 10 MG/ML IJ SOLN
120.0000 mg | Freq: Two times a day (BID) | INTRAVENOUS | Status: DC
  Administered 2022-01-10 – 2022-01-11 (×4): 120 mg via INTRAVENOUS
  Filled 2022-01-10 (×2): qty 12
  Filled 2022-01-10 (×2): qty 10
  Filled 2022-01-10: qty 12
  Filled 2022-01-10: qty 10
  Filled 2022-01-10: qty 12

## 2022-01-10 MED ORDER — METOLAZONE 5 MG PO TABS
5.0000 mg | ORAL_TABLET | Freq: Once | ORAL | Status: AC
  Administered 2022-01-10: 5 mg via ORAL
  Filled 2022-01-10: qty 1

## 2022-01-10 NOTE — Progress Notes (Signed)
PROGRESS NOTE    Patricia Wagner  TKP:[id number] DOB: 10-14-1951 DOA: 12/30/2021 PCP: Janith Lima, MD  Brief Narrative:70/F with history of chronic systolic CHF, NICM, EF 51%, A-fib, CKD 3B, history of neurogenic bladder with urinary retention and chronic Foley rheumatoid arthritis, Raynaud's disease, CVA, COPD admitted to the ICU with respiratory distress/fluid overload with AKI, NSTEMI-troponin peaked at 74, UTI,, did not require mechanical ventilation, subsequently transferred to [facility] course was complicated by acute embolic stroke, started on anticoagulation -Heart failure team and nephrology following   Subjective: -Denies any nausea vomiting or anorexia, breathing stable  Assessment and Plan:  * Acute on chronic combined systolic and diastolic CHF (congestive heart failure) ([place])- (present on admission) Mild to moderate mitral regurgitation Acute hypoxic hypercapnic respiratory failure Treated with BiPAP on admission  -echocardiogram 12/31/2021 has demonstrated EF 45-50%, severe RV enlargement with dysfunction  -Complicated by acute kidney injury on CKD 3b -Lasix restarted 80 Mg twice daily with metolazone -Also on midodrine 10 mg 3 times daily -Urine output picked up last 2 days, 1725 yesterday, creatinine 3.7 -Reported that she does not want to pursue hemodialysis, but also wants to remain full code with full aggressive scope of care, has poor insight -Will update daughter again  Acute renal failure (ARF) ([place])- (present on admission) AKI on CKD 3b Cardiorenal syndrome w/ ischemic ATN secondary to diuretics Lasix Entresto Jardiance etc. -Renal ultrasound negative for hydronephrosis -IV Lasix, was on albumin as well without much urine output -Nephrology following, she reiterates that she will not pursue hemodialysis -Palliative care consulted for goals of care, wants full scope of treatment  Acute CVA (cerebrovascular accident) [facility]) On admission the  patient was found to have dysarthria. CT head was performed and was negative for acute findings - MRI brain noted scattered tine acute infarcts bilaterally consistent with embolic stroke, known history of A-fib, was not on anticoagulation due to history of diverticular bleeds, neurology consulting, now started on Eliquis, MRA negative for large vessel occlusion, carotid duplex noted minimal plaque in the extracranial vessels, LDL 47, A1c is 5.9 -PT OT eval  CAD in native artery, s/p cardiac cath with non obstructive CAD- (present on admission) NSTEMI CAD in native artery, s/p cardiac cath with non obstructive CAD- (present on admission) - Troponin peaked at 5916 -Previous cardiac catheterization done in 2017 showing mild nonobstructive CAD -Treated with IV heparin initially, no chest pain, no plans for left heart cath in the setting of AKI  UTI (urinary tract infection) -Secondary to chronic Foley -Urine culture has grown out Klebsiella pneumoniae and Serratia Marcescens. Both are sensitive to Rocephin, completed course, will discuss with staff if catheter has been changed  PAF (paroxysmal atrial fibrillation) ([place])- (present on admission) Status post ablation in 2020 and amiodarone was stopped.  Not on Eliquis due to history of recurrent diverticular GI bleeding.  This has been restarted after further discussion regarding risk-benefit analysis  Thrombocytopenia ([place])- (present on admission) -Platelet count dropped to 55 K, now improving > 100 K, likely related to critical illness  Acute renal failure superimposed on stage 3b chronic kidney disease ([place])- (present on admission) BUN 123, creatinine 3.99(baseline 1.3-1.5).  Patient is on Lasix 80 mg daily. Renal ultrasound is normal. Nephrology is consulted. I appreciate their assistance.   DVT prophylaxis: Apixaban Code Status: Full code Family Communication: No family at bedside, will update daughter Disposition Plan:   Consultants:   Nephrology, cardiology  Procedures:   Antimicrobials:    Objective: Vitals:  01/09/22 2350 01/10/22 0433 01/10/22 0814 01/10/22 1108  BP: 138/70 122/71 (!) 152/69 (!) 106/53  Pulse: 80  87 66  Resp: (!) 21 17 16 18   Temp: 98.1 F (36.7 C) 97.7 F (36.5 C)  98.7 F (37.1 C)  TempSrc:  Oral  Oral  SpO2:  95% 97% 98%  Weight:  109.2 kg    Height:        Intake/Output Summary (Last 24 hours) at 01/10/2022 1340 Last data filed at 01/10/2022 1301 Gross per 24 hour  Intake 1394 ml  Output 1325 ml  Net 69 ml   Filed Weights   01/08/22 0401 01/09/22 0346 01/10/22 0433  Weight: 116.2 kg 111.3 kg 109.2 kg    Examination:  General exam: Obese pleasant female sitting up in bed, AAO x3, no distress HEENT: Positive JVD, right IJ HD catheter CVS: S1-S2, regular rate rhythm Lungs: Decreased breath sounds to bases Abdomen: Soft, nontender, bowel sounds present Extremities: 1+ edema Psychiatry:  Mood & affect appropriate.   Data Reviewed:   CBC: Recent Labs  Lab 01/03/22 1637 01/04/22 0453 01/06/22 0415 01/07/22 0355 01/08/22 0415 01/09/22 0427 01/10/22 0436  WBC 11.8*   < > 10.2 9.4 9.9 10.7* 9.3  NEUTROABS 10.2*  --   --   --   --   --   --   HGB 11.9*   < > 10.9* 10.2* 10.0* 10.8* 9.8*  HCT 37.9   < > 35.3* 31.9* 31.3* 34.4* 32.9*  MCV 90.0   < > 89.1 87.6 87.2 87.1 90.1  PLT 56*   < > 60* 70* 91* 110* 165   < > = values in this interval not displayed.   Basic Metabolic Panel: Recent Labs  Lab 01/03/22 1637 01/04/22 0453 01/06/22 0415 01/07/22 0355 01/08/22 0415 01/09/22 0427 01/10/22 0436  NA 135   < > 137 137 139 139 142  K 3.7   < > 3.7 3.7 3.8 4.1 3.3*  CL 98   < > 99 99 100 99 101  CO2 25   < > 26 25 27 25 31   GLUCOSE 117*   < > 111* 92 94 89 98  BUN 128*   < > 115* 112* 112* 118* 114*  CREATININE 4.49*   < > 3.84* 3.88* 3.73* 4.03* 3.71*  CALCIUM 8.1*   < > 8.2* 8.3* 8.5* 8.6* 8.4*  MG 2.7*  --   --   --   --   --   --   PHOS  --    < > 5.6*  5.3* 5.0* 4.8* 4.8*   < > = values in this interval not displayed.   GFR: Estimated Creatinine Clearance: 17.5 mL/min (A) (by C-G formula based on SCr of 3.71 mg/dL (H)). Liver Function Tests: Recent Labs  Lab 01/06/22 0415 01/07/22 0355 01/08/22 0415 01/09/22 0427 01/10/22 0436  ALBUMIN 2.1* 2.5* 3.0* 2.8* 2.4*   No results for input(s): LIPASE, AMYLASE in the last 168 hours. No results for input(s): AMMONIA in the last 168 hours. Coagulation Profile: No results for input(s): INR, PROTIME in the last 168 hours. Cardiac Enzymes: No results for input(s): CKTOTAL, CKMB, CKMBINDEX, TROPONINI in the last 168 hours. BNP (last 3 results) No results for input(s): PROBNP in the last 8760 hours. HbA1C: No results for input(s): HGBA1C in the last 72 hours.  CBG: No results for input(s): GLUCAP in the last 168 hours.  Lipid Profile: No results for input(s): CHOL, HDL, LDLCALC, TRIG, CHOLHDL, LDLDIRECT  in the last 72 hours.  Thyroid Function Tests: No results for input(s): TSH, T4TOTAL, FREET4, T3FREE, THYROIDAB in the last 72 hours. Anemia Panel: No results for input(s): VITAMINB12, FOLATE, FERRITIN, TIBC, IRON, RETICCTPCT in the last 72 hours. Urine analysis:    Component Value Date/Time   COLORURINE YELLOW 01/03/2022 1124   APPEARANCEUR CLOUDY (A) 01/03/2022 1124   LABSPEC 1.011 01/03/2022 1124   PHURINE 5.0 01/03/2022 1124   GLUCOSEU NEGATIVE 01/03/2022 1124   GLUCOSEU NEGATIVE 05/22/2021 1435   HGBUR SMALL (A) 01/03/2022 1124   BILIRUBINUR NEGATIVE 01/03/2022 1124   KETONESUR NEGATIVE 01/03/2022 1124   PROTEINUR 30 (A) 01/03/2022 1124   UROBILINOGEN 0.2 05/22/2021 1435   NITRITE NEGATIVE 01/03/2022 1124   LEUKOCYTESUR LARGE (A) 01/03/2022 1124   Sepsis Labs: @LABRCNTIP (procalcitonin:4,lacticidven:4)  ) Recent Results (from the past 240 hour(s))  MRSA Next Gen by PCR, Nasal     Status: None   Collection Time: 12/31/21  1:55 PM   Specimen: Nasal Mucosa; Nasal Swab   Result Value Ref Range Status   MRSA by PCR Next Gen NOT DETECTED NOT DETECTED Final    Comment: (NOTE) The GeneXpert MRSA Assay (FDA approved for NASAL specimens only), is one component of a comprehensive MRSA colonization surveillance program. It is not intended to diagnose MRSA infection nor to guide or monitor treatment for MRSA infections. Test performance is not FDA approved in patients less than 37 years old. Performed at [facility], [place] [address], [place], [place] [address]   Urine Culture     Status: Abnormal   Collection Time: 12/31/21  2:28 PM   Specimen: Urine, Catheterized  Result Value Ref Range Status   Specimen Description URINE, CATHETERIZED  Final   Special Requests   Final    NONE Performed at [facility], [place] [address], [place], [place] [address]    Culture (A)  Final    >=100,000 COLONIES/mL KLEBSIELLA PNEUMONIAE 60,000 COLONIES/mL SERRATIA MARCESCENS    Report Status 01/03/2022 FINAL  Final   Organism ID, Bacteria KLEBSIELLA PNEUMONIAE (A)  Final   Organism ID, Bacteria SERRATIA MARCESCENS (A)  Final      Susceptibility   Klebsiella pneumoniae - MIC*    AMPICILLIN >=32 RESISTANT Resistant     CEFAZOLIN <=4 SENSITIVE Sensitive     CEFEPIME <=0.12 SENSITIVE Sensitive     CEFTRIAXONE <=0.25 SENSITIVE Sensitive     CIPROFLOXACIN <=0.25 SENSITIVE Sensitive     GENTAMICIN <=1 SENSITIVE Sensitive     IMIPENEM <=0.25 SENSITIVE Sensitive     NITROFURANTOIN 128 RESISTANT Resistant     TRIMETH/SULFA <=20 SENSITIVE Sensitive     AMPICILLIN/SULBACTAM 8 SENSITIVE Sensitive     PIP/TAZO <=4 SENSITIVE Sensitive     * >=100,000 COLONIES/mL KLEBSIELLA PNEUMONIAE   Serratia marcescens - MIC*    CEFAZOLIN >=64 RESISTANT Resistant     CEFEPIME <=0.12 SENSITIVE Sensitive     CEFTRIAXONE <=0.25 SENSITIVE Sensitive     CIPROFLOXACIN <=0.25 SENSITIVE Sensitive     GENTAMICIN <=1 SENSITIVE Sensitive     NITROFURANTOIN 256 RESISTANT Resistant      TRIMETH/SULFA <=20 SENSITIVE Sensitive     * 60,000 COLONIES/mL SERRATIA MARCESCENS     Radiology Studies: No results found.   Scheduled Meds:  apixaban  5 mg Oral BID   chlorhexidine  15 mL Mouth Rinse BID   Chlorhexidine Gluconate Cloth  6 each Topical Daily   Gerhardt's butt cream   Topical Daily   mouth rinse  15  mL Mouth Rinse q12n4p   mexiletine  200 mg Oral Q12H   midodrine  10 mg Oral TID WC   pantoprazole  40 mg Oral Daily   sodium chloride flush  10-40 mL Intracatheter Q12H   Continuous Infusions:  sodium chloride Stopped (01/04/22 2119)   sodium chloride     furosemide 120 mg (01/10/22 0952)     LOS: 10 days    Time spent: 6min  Domenic Polite, MD [facility]   01/10/2022, 1:40 PM

## 2022-01-10 NOTE — Progress Notes (Signed)
Daily Progress Note   Patient Name: Patricia Wagner       Date: 01/10/2022 DOB: [birth year]  Age: 71 y.o. MRN#: [id number] Attending Physician: Domenic Polite, MD Primary Care Physician: Janith Lima, MD Admit Date: 12/30/2021   Reason for Consultation: Establishing goals of care "Multiple co-morbidities including renal failure, heading towards dialysis but patient said she doesn't want dialysis"  HPI/Patient Profile: 71 y.o. female  with past medical history of chronic systolic CHF due to nonischemic cardiomyopathy (EF 35-40%), A-fib not on Eliquis due to history of diverticular GI bleeding, PVCs, CKD stage IIIb, rheumatoid arthritis, CVA, COPD, hypertension, and hyperlipidemia. She presented to the emergency department on 12/30/2021 with acute onset of slurred speech and right arm weakness. ABG revealed pH 7.1, PCO2 62, and PO2 79 so she was placed on BiPAP. Creatinine 4.9 (baseline of 1.3 - 1.5). BNP > 4500, high sensitivity troponin 5916 > 5616. Chest x-ray showed cardiomegaly and vascular congestion; no pulmonary edema. CT head negative for acute finding.  Admitted to [facility] with AKI on CKD, acute respiratory failure, and acute on chronic heart failure.    MRI done 2/12 due to continued slurred speech and showed multiple bilateral small and punctate infarcts likely secondary to embolism from afib not an anticoagulation.      Subjective: Chart reviewed. Note that creatinine today is 3.71 (down from 4.95 on admission).   I spoke with patient's daughter [name] by phone. I introduced myself and the role of PMT. I let [name] know about the conversation I had with her mother on 2/15, emphasizing that it remains unclear if she would be willing to do dialysis if there was an urgent need.   [name] shares  that her mother told her she doesn't understand dialysis. She also shares that she thinks her mother may have "given up" because she is tired of dealing with her multiple medical issues. I recommended that [name] and I talk to her mother together.   I went to bedside to see patient, called [name] and placed her on speaker phone. Patient again expresses frustration that people keep asking her the same question. After further discussion, patient ultimately states that she is willing to do dialysis if absolutely necessary. She states she is willing "to do what it takes to live". However, she wants to avoid dialysis  long as possible.   Discussed the importance of continued conversation with the medical team regarding overall plan of care and treatment options, ensuring decisions are within the context of the patients values and GOCs.   Objective:   Length of Stay: 10   Physical Exam Vitals reviewed.  Constitutional:      General: She is not in acute distress.    Appearance: She is obese. She is ill-appearing.  Pulmonary:     Effort: Pulmonary effort is normal.  Neurological:     Mental Status: She is alert and oriented to person, place, and time.            Vital Signs: BP 113/65 (BP Location: Left Arm)    Pulse 68    Temp 98.6 F (37 C) (Oral)    Resp 18    Ht 5\' 4"  (1.626 m)    Wt 109.2 kg    SpO2 96%    BMI 41.32 kg/m  SpO2: SpO2: 96 % O2 Device: O2 Device: Nasal Cannula O2 Flow Rate: O2 Flow Rate (L/min): 2 L/min   LBM: Last BM Date : 01/10/22 Baseline Weight: Weight: 98.9 kg Most recent weight: Weight: 109.2 kg       Palliative Assessment/Data: PPS 50%       Palliative Care Assessment & Plan   Assessment: - acute on chronic combined systolic and diastolic CHF - mild to moderate mitral regurgitation - acute hypoxic hypercapnic respiratory failure - AKI on CKD IIIb - cardiorenal syndrome with ischemic ATN - acute CVA - UTI - PAF  Recommendations/Plan: Full code  full scope Patient is willing to do dialysis if there is an urgent need, but wants to avoid it as long as possible PMT will continue to follow   Prognosis:  Unable to determine  Discharge Planning: Goal is home    Thank you for allowing the Palliative Medicine Team to assist in the care of this patient.  MDM - High   [name], NP  Please contact Palliative Medicine Team phone at [phone] for questions and concerns.

## 2022-01-10 NOTE — Plan of Care (Signed)
  Problem: Elimination: Goal: Will not experience complications related to urinary retention Outcome: Progressing   Problem: Pain Managment: Goal: General experience of comfort will improve Outcome: Progressing   

## 2022-01-10 NOTE — Plan of Care (Signed)
°  Problem: Education: Goal: Ability to demonstrate management of disease process will improve Outcome: Progressing   Problem: Education: Goal: Knowledge of General Education information will improve Description: Including pain rating scale, medication(s)/side effects and non-pharmacologic comfort measures Outcome: Progressing   

## 2022-01-10 NOTE — Progress Notes (Addendum)
Advanced Heart Failure Rounding Note  PCP-Cardiologist: Glori Bickers, MD   Subjective:     R IJ HD catheter placed 02/09  Scr peaked at 5.24, trending down to 3.7   Nephrology increased lasix to 80 mg IV BID + metolazone 5 mg once. Weight down 5 pounds.   Denies SOB. Denies pain.   Objective:    109.2 kg Body mass index is 41.32 kg/m.   Vital Signs:   Temp:  [97.7 F (36.5 C)-98.5 F (36.9 C)] 97.7 F (36.5 C) (02/17 0433) Pulse Rate:  [66-89] 87 (02/17 0814) Resp:  [16-21] 16 (02/17 0814) BP: (112-152)/(60-71) 152/69 (02/17 0814) SpO2:  [95 %-97 %] 97 % (02/17 0814) Weight:  [109.2 kg] 109.2 kg (02/17 0433) Last BM Date : 01/09/22  Weight change: Filed Weights   01/08/22 0401 01/09/22 0346 01/10/22 0433  Weight: 116.2 kg 111.3 kg 109.2 kg    Intake/Output:   Intake/Output Summary (Last 24 hours) at 01/10/2022 0909 Last data filed at 01/10/2022 7622 Gross per 24 hour  Intake 1356 ml  Output 1725 ml  Net -369 ml      Physical Exam  CVP 11-12  General: No resp difficulty HEENT: normal Neck: supple. JVP 9-10 Carotids 2+ bilat; no bruits. No lymphadenopathy or thryomegaly appreciated. RIJ  Cor: PMI nondisplaced. Regular rate & rhythm. No rubs, gallops or murmurs. Lungs: Decreased in the bases 2  liters.  Abdomen: soft, nontender, nondistended. No hepatosplenomegaly. No bruits or masses. Good bowel sounds. Extremities: no cyanosis, clubbing, rash, R and LLE unna boots. R and LLE 1+  Neuro: alert & orientedx3, cranial nerves grossly intact. moves all 4 extremities w/o difficulty. Affect pleasant    Telemetry   SR 70-80s with occasional PVCs. Personally checked.  Labs    CBC Recent Labs    01/09/22 0427 01/10/22 0436  WBC 10.7* 9.3  HGB 10.8* 9.8*  HCT 34.4* 32.9*  MCV 87.1 90.1  PLT 110* 633   Basic Metabolic Panel Recent Labs    01/09/22 0427 01/10/22 0436  NA 139 142  K 4.1 3.3*  CL 99 101  CO2 25 31  GLUCOSE 89 98  BUN  118* 114*  CREATININE 4.03* 3.71*  CALCIUM 8.6* 8.4*  PHOS 4.8* 4.8*   Liver Function Tests Recent Labs    01/09/22 0427 01/10/22 0436  ALBUMIN 2.8* 2.4*   No results for input(s): LIPASE, AMYLASE in the last 72 hours. Cardiac Enzymes No results for input(s): CKTOTAL, CKMB, CKMBINDEX, TROPONINI in the last 72 hours.  BNP: BNP (last 3 results) Recent Labs    12/30/21 2342  BNP >4,500.0*    ProBNP (last 3 results) No results for input(s): PROBNP in the last 8760 hours.   D-Dimer No results for input(s): DDIMER in the last 72 hours. Hemoglobin A1C No results for input(s): HGBA1C in the last 72 hours.  Fasting Lipid Panel No results for input(s): CHOL, HDL, LDLCALC, TRIG, CHOLHDL, LDLDIRECT in the last 72 hours.  Thyroid Function Tests No results for input(s): TSH, T4TOTAL, T3FREE, THYROIDAB in the last 72 hours.  Invalid input(s): FREET3  Other results:   Imaging    No results found.   Medications:     Scheduled Medications:  apixaban  5 mg Oral BID   chlorhexidine  15 mL Mouth Rinse BID   Chlorhexidine Gluconate Cloth  6 each Topical Daily   Gerhardt's butt cream   Topical Daily   mouth rinse  15 mL Mouth Rinse q12n4p  mexiletine  200 mg Oral Q12H   midodrine  10 mg Oral TID WC   pantoprazole  40 mg Oral Daily   sodium chloride flush  10-40 mL Intracatheter Q12H    Infusions:  sodium chloride Stopped (01/04/22 2119)   sodium chloride     furosemide      PRN Medications: sodium chloride, acetaminophen **OR** acetaminophen, heparin, loperamide, sodium chloride flush    Patient Profile   71 y/o female w/ chronic biventricular heart failure/ nonischemic CM, felt likely tachymediated from Afib/PVCs, Stage IIIb CKD and neurogenic bladder w/ urinary retention w/ chronic foley, admitted w/ acute hypoxic respiratory failure and AKI, in setting of UTI and a/c CHF.   Assessment/Plan   1. Acute on Chronic Biventricular Heart Failure, w/ Predominant  RV Failure - NICM, LHC 2017 w/ mild nonobstructive CAD - Echo 6/18 EF 15% in setting of tachy CM from AF - Echo 02/2018: EF 40-45%, grade 1 DD, RV normal - Echo 8/20 45-50% Personally reviewed - Echo 9/22 EF read as 35-40% (I think closer to 30%) - suspect tachymediated CM from Afib and high PVC burden  - Previous Amyloid w/u negative with negative fat pad biopsy and PYP scan (2018).  - Echo this admit, EF 40-45%, RV severely enlarged w/ moderately reduced systolic function  - Now w/ a/c CHF c/b AKI on Stage lllb CKD  - Volume status improving. CVP 10-11 - Nephrology increased diuretics. Continue to diurese.  - Unable to tolerate GDMT w/ hypotension and AKI. Home entresto and jardiance on hold - Continue midodrine 10 tid for BP support, titrate further if needed  - UNNA boots   2. AKI on CKD Stage IIIb - Baseline SCr 1.4-1.6 - Scr peaked at 5.25, 3.7 today   - nephrology following, felt to be ATN + component of cardiorenal syndrome. - Nephrology dosing diuretics as above. She does not want dialysis   3. Acute Hypoxic Respiratory Failure - O2 sats 70-80s on admit, requiring BiPAP  - Flu and Covid negative  - admit CXR showed vascular congestion but diuretics held on admit due to suspected hypovolemic AKI in setting of diarrhea. Now diuresing with IV lasix. - O2 now stable on 2L [place]   4. UTI  - chronic foley for neurogenic bladder w/ urinary retention  - UA + on admit - UCx + Klebsiella Pneumoniae + Serratia Marcescens  - Treating w/ ceftriaxone (sensitive on susceptibilities) per primary team   5. Tricuspid Regurgitation  - trivial TR on echo 9/22 - severe on echo this admit - likely functional, RV severely enlarged    6. PVCs/ NSVT - Zio 7/22 showed 7.0% burden, started on mexiletine (held on admit) - Amio gtt off. Back on mexiletine 200 mg BID. PVCs  burden improved.   7. NSTEMI  - Hs trop 5,916>>5,616>>5,916 - in the setting of acute renal failure  - Denies CP, EKG  nonischemic. LHC 2017 minimal CAD - No plans for LHC    8. PAF - Hx AF ablation - NSR w/ PVCs - Had not been on anticoagulation given h/o recurrent GIBs d/t diverticular disease.  - Now on Eliquis 5 mg BID given embolic CVA this admit. Watch for bleeding. Hgb 10   9. Thrombocytopenia - Plt 98 on admit, down to 50s-60s. Improving last few days. Up to 91. - off heparin, but doubt HIT given plts were low at baseline - suspect 2/2 critical illness   10. Acute CVA -CT head on 02/06 with old left cerebellar  infarct. No acute finding. -Initially thought to be d/t hypoxia.  -MRI brain 02/12 with scattered punctate acute b/l cerebral and right cerebellar infarcts suggesting central embolic source -MRA with no intracranial LVO, chronically occluded left vertebral artery -Neurology following. Felt to be due to afib. Now on anticoagulation.   PT/OT recommending SNF for rehab  Palliative Care on board for [place] discussion  Length of Stay: Yeager, NP  01/10/2022, 9:09 AM  Advanced Heart Failure Team Pager [phone] (M-F; 7a - 5p)  Please contact [facility] for night-coverage after hours (5p -7a ) and weekends on [email or web address]  Patient seen and examined with the above-signed Advanced Practice Provider and/or Housestaff. I personally reviewed laboratory data, imaging studies and relevant notes. I independently examined the patient and formulated the important aspects of the plan. I have edited the note to reflect any of my changes or salient points. I have personally discussed the plan with the patient and/or family.  Denies SOB, orthopnea or PND. Diuresing but still volume overloaded. Renal increased IV diuretics. Co-ox ok  Scr seems to be leveling out around 3.7 for now. Met will Palliative Care. Remains full code.   General:  Sitting up in bed No resp difficulty HEENT: normal Neck: supple.JVP to jaw  RIJ HD cath Carotids 2+ bilat; no bruits. No lymphadenopathy or thryomegaly  appreciated. Cor: PMI nondisplaced. Regular rate & rhythm. No rubs, gallops or murmurs. Lungs: clear Abdomen: soft, nontender, nondistended. No hepatosplenomegaly. No bruits or masses. Good bowel sounds. Extremities: no cyanosis, clubbing, rash, 2+ edema Neuro: alert & orientedx3, cranial nerves grossly intact. moves all 4 extremities w/o difficulty. Affect pleasant  Continues to diurese well. Co-ox stable. Renal function improving.   Agree with current management.   HF team will see again Monday unless called over the w/e.   Glori Bickers, MD  10:52 PM

## 2022-01-10 NOTE — Progress Notes (Signed)
Report received from previous RN. Patient resting in bed. Will continue to moitor.

## 2022-01-10 NOTE — Progress Notes (Addendum)
[facility] NEPHROLOG[facility] PROGRESS NOTE  Assessment/ Plan: Pt is a 71 y.o. yo female  with a PMH chronic systolic CHF, A-fib not on Eliquis due to history of diverticular GI bleeding, PVCs, CKD stage IIIb, chronic systolic CHF due to nonischemic cardiomyopathy (EF 35 to 40%), rheumatoid arthritis, Raynaud's disease, CVA, COPD, hypertension, hyperlipidemia.  #Acute kidney injury on CKD stage IIIb: Possible ATN but also cardiorenal syndrome.  Kidney ultrasound without hydronephrosis.  Urine is cloudy with many bacteria and cells.  ANA, anti-GBM antibody, ANCA, complements unremarkable.   Continue with IV Lasix increasing to 120 mg twice daily and continue metolazone 5 mg today.  She does not want dialysis therefore we consulted palliative care to address goals of care.  Fortunately, no urgent need for HD today.  Continue with strict ins and out, daily lab.  Albumin very low at 2.4.  If does decrease further may consider IV albumin. -She has a temporary HD catheter in place. She has expressed that she would not want dialysis in the past but is hard to pin down today. Will need to remove this if I get a more straight forward answer  #Acute hypoxic and hypercapnic respiratory failure: Volume management with diuretics and BiPAP as needed.  #Chronic combined systolic and diastolic CHF: Continue to hold Entresto and Jardiance because of AKI.  Diuretics as above.  Largely right-sided.  Cardiology is following.  # UTI: Urine culture is growing Klebsiella and Serratia, treated with ceftriaxone.  #Metabolic acidosis: Improved.  #Anemia of chronic disease/CKD: Hemoglobin at goal.  #Hypotension: She is on midodrine.  Monitor BP.  #Hyperphosphatemia: Resolved  #Acute stroke: Seen by neurology team.    Subjective: Creatinine slightly better today.  Urine output about the same at 1.8 L.  Weight has improved based on measurements.  Denies shortness of breath.  Objective Vital signs in last  24 hours: Vitals:   01/09/22 2004 01/09/22 2350 01/10/22 0433 01/10/22 0814  BP: 118/60 138/70 122/71 (!) 152/69  Pulse: 66 80  87  Resp: 20 (!) 21 17 16   Temp: 98.5 F (36.9 C) 98.1 F (36.7 C) 97.7 F (36.5 C)   TempSrc: Oral  Oral   SpO2: 95%  95% 97%  Weight:   109.2 kg   Height:       Weight change: -2.1 kg  Intake/Output Summary (Last 24 hours) at 01/10/2022 0945 Last data filed at 01/10/2022 0037 Gross per 24 hour  Intake 1356 ml  Output 1725 ml  Net -369 ml       Labs: Basic Metabolic Panel: Recent Labs  Lab 01/08/22 0415 01/09/22 0427 01/10/22 0436  NA 139 139 142  K 3.8 4.1 3.3*  CL 100 99 101  CO2 27 25 31   GLUCOSE 94 89 98  BUN 112* 118* 114*  CREATININE 3.73* 4.03* 3.71*  CALCIUM 8.5* 8.6* 8.4*  PHOS 5.0* 4.8* 4.8*   Liver Function Tests: Recent Labs  Lab 01/08/22 0415 01/09/22 0427 01/10/22 0436  ALBUMIN 3.0* 2.8* 2.4*   No results for input(s): LIPASE, AMYLASE in the last 168 hours. No results for input(s): AMMONIA in the last 168 hours. CBC: Recent Labs  Lab 01/03/22 1637 01/04/22 0453 01/06/22 0415 01/07/22 0355 01/08/22 0415 01/09/22 0427 01/10/22 0436  WBC 11.8*   < > 10.2 9.4 9.9 10.7* 9.3  NEUTROABS 10.2*  --   --   --   --   --   --   HGB 11.9*   < > 10.9* 10.2* 10.0*  10.8* 9.8*  HCT 37.9   < > 35.3* 31.9* 31.3* 34.4* 32.9*  MCV 90.0   < > 89.1 87.6 87.2 87.1 90.1  PLT 56*   < > 60* 70* 91* 110* 165   < > = values in this interval not displayed.   Cardiac Enzymes: No results for input(s): CKTOTAL, CKMB, CKMBINDEX, TROPONINI in the last 168 hours. CBG: No results for input(s): GLUCAP in the last 168 hours.  Iron Studies: No results for input(s): IRON, TIBC, TRANSFERRIN, FERRITIN in the last 72 hours. Studies/Results: No results found.  Medications: Infusions:  sodium chloride Stopped (01/04/22 2119)   sodium chloride     furosemide      Scheduled Medications:  apixaban  5 mg Oral BID   chlorhexidine  15 mL  Mouth Rinse BID   Chlorhexidine Gluconate Cloth  6 each Topical Daily   Gerhardt's butt cream   Topical Daily   mouth rinse  15 mL Mouth Rinse q12n4p   mexiletine  200 mg Oral Q12H   midodrine  10 mg Oral TID WC   pantoprazole  40 mg Oral Daily   potassium chloride  40 mEq Oral Once   sodium chloride flush  10-40 mL Intracatheter Q12H    have reviewed scheduled and prn medications.  Physical Exam: General: Lying in bed, no distress Heart: Normal rate Lungs: Bilateral chest rise with no increased work of breathing Abdomen:soft, Non-tender, non-distended Extremities: 1+ pitting edema present, overall stable Dialysis Access: Temporary HD catheter, right IJ Neurology: Alert awake and following commands. Patricia Wagner 01/10/2022,9:45 AM  LOS: 10 days

## 2022-01-11 DIAGNOSIS — I5043 Acute on chronic combined systolic (congestive) and diastolic (congestive) heart failure: Secondary | ICD-10-CM | POA: Diagnosis not present

## 2022-01-11 LAB — BASIC METABOLIC PANEL
Anion gap: 12 (ref 5–15)
BUN: 115 mg/dL — ABNORMAL HIGH (ref 8–23)
CO2: 32 mmol/L (ref 22–32)
Calcium: 8.7 mg/dL — ABNORMAL LOW (ref 8.9–10.3)
Chloride: 99 mmol/L (ref 98–111)
Creatinine, Ser: 4.01 mg/dL — ABNORMAL HIGH (ref 0.44–1.00)
GFR, Estimated: 12 mL/min — ABNORMAL LOW (ref >60)
Glucose, Bld: 91 mg/dL (ref 70–99)
Potassium: 4.1 mmol/L (ref 3.5–5.1)
Sodium: 143 mmol/L (ref 135–145)

## 2022-01-11 LAB — CBC
HCT: 35.2 % — ABNORMAL LOW (ref 36.0–46.0)
Hemoglobin: 10.7 g/dL — ABNORMAL LOW (ref 12.0–15.0)
MCH: 27.6 pg (ref 26.0–34.0)
MCHC: 30.4 g/dL (ref 30.0–36.0)
MCV: 91 fL (ref 80.0–100.0)
Platelets: 192 10*3/uL (ref 150–400)
RBC: 3.87 MIL/uL (ref 3.87–5.11)
RDW: 15.9 % — ABNORMAL HIGH (ref 11.5–15.5)
WBC: 9.3 10*3/uL (ref 4.0–10.5)
nRBC: 0 % (ref 0.0–0.2)

## 2022-01-11 LAB — RENAL FUNCTION PANEL
Albumin: 2.7 g/dL — ABNORMAL LOW (ref 3.5–5.0)
Anion gap: 12 (ref 5–15)
BUN: 115 mg/dL — ABNORMAL HIGH (ref 8–23)
CO2: 32 mmol/L (ref 22–32)
Calcium: 8.6 mg/dL — ABNORMAL LOW (ref 8.9–10.3)
Chloride: 98 mmol/L (ref 98–111)
Creatinine, Ser: 3.94 mg/dL — ABNORMAL HIGH (ref 0.44–1.00)
GFR, Estimated: 12 mL/min — ABNORMAL LOW (ref >60)
Glucose, Bld: 90 mg/dL (ref 70–99)
Phosphorus: 4.6 mg/dL (ref 2.5–4.6)
Potassium: 4.1 mmol/L (ref 3.5–5.1)
Sodium: 142 mmol/L (ref 135–145)

## 2022-01-11 LAB — COOXEMETRY PANEL
Carboxyhemoglobin: 2.4 % — ABNORMAL HIGH (ref 0.5–1.5)
Methemoglobin: <0.7 % (ref 0.0–1.5)
O2 Saturation: 29.6 %
Total hemoglobin: 11 g/dL — ABNORMAL LOW (ref 12.0–16.0)

## 2022-01-11 MED ORDER — ALTEPLASE 2 MG IJ SOLR
2.0000 mg | Freq: Once | INTRAMUSCULAR | Status: AC
  Administered 2022-01-11: 2 mg
  Filled 2022-01-11: qty 2

## 2022-01-11 NOTE — Progress Notes (Signed)
[facility] PROGRESS NOTE  Assessment/ Plan: Pt is a 71 y.o. yo female  with a PMH chronic systolic CHF, A-fib not on Eliquis due to history of diverticular GI bleeding, PVCs, CKD stage IIIb, chronic systolic CHF due to nonischemic cardiomyopathy (EF 35 to 40%), rheumatoid arthritis, Raynaud's disease, CVA, COPD, hypertension, hyperlipidemia.  #Acute kidney injury on CKD stage IIIb: Possible ATN but also cardiorenal syndrome.  Kidney ultrasound without hydronephrosis.  Urine is cloudy with many bacteria and cells.  ANA, anti-GBM antibody, ANCA, complements unremarkable.    She has expressed that she did not want dialysis in the past but now more open to it.  She really wants to give her kidneys the best shot for recovery and I think this is reasonable.  We can continue to hold off on dialysis.  Per patient's request we will remove her temporary dialysis catheter.  If she is to need dialysis would probably proceed with a tunneled dialysis catheter at that time anyway as it appears that she will not need urgent dialysis in the next couple days. -Continue IV Lasix 120 mg twice daily -Can adjust doses of diuretics as needed -Assess for dialysis needs daily; currently no signs of uremia  #Acute hypoxic and hypercapnic respiratory failure: Volume management with diuretics   #Chronic combined systolic and diastolic CHF: Continue to hold Entresto and Jardiance because of AKI.  Diuretics as above.  Largely right-sided.  Cardiology is following.  # UTI: Urine culture is growing Klebsiella and Serratia, treated with ceftriaxone.  #Anemia of chronic disease/CKD: Hemoglobin at goal.  #Hypotension: She is on midodrine.  Monitor BP.   #Acute stroke: Seen by neurology team.    Subjective: Patient and I had a good conversation today.  We discussed that her creatinine continued to be around 3.5-4 at 3.9 today.  She does not feel significantly short of breath.  She met with  palliative care yesterday and had a good conversation about dialysis.  She states that she felt like people were really pushing it on her and she was not ready for that decision and did not feel like she needed it.  She would be open to it and learning more about it in the future but does not think she needs it right now.  She is somewhat frustrated that she has a temporary dialysis catheter and does not know why it is in.  It has been in place since 2/9.  Objective Vital signs in last 24 hours: Vitals:   01/11/22 0345 01/11/22 0346 01/11/22 0347 01/11/22 0739  BP:    114/69  Pulse:  78  60  Resp: 20  20 (!) 23  Temp:  97.9 F (36.6 C)  98.5 F (36.9 C)  TempSrc:  Oral  Oral  SpO2:  95%  98%  Weight:  112.6 kg    Height:       Weight change: 3.4 kg  Intake/Output Summary (Last 24 hours) at 01/11/2022 0828 Last data filed at 01/11/2022 0408 Gross per 24 hour  Intake 997.5 ml  Output 1050 ml  Net -52.5 ml       Labs: Basic Metabolic Panel: Recent Labs  Lab 01/09/22 0427 01/10/22 0436 01/11/22 0431 01/11/22 0440  NA 139 142 143 142  K 4.1 3.3* 4.1 4.1  CL 99 101 99 98  CO2 25 31 32 32  GLUCOSE 89 98 91 90  BUN 118* 114* 115* 115*  CREATININE 4.03* 3.71* 4.01* 3.94*  CALCIUM 8.6* 8.4* 8.7*  8.6*  PHOS 4.8* 4.8*  --  4.6   Liver Function Tests: Recent Labs  Lab 01/09/22 0427 01/10/22 0436 01/11/22 0440  ALBUMIN 2.8* 2.4* 2.7*   No results for input(s): LIPASE, AMYLASE in the last 168 hours. No results for input(s): AMMONIA in the last 168 hours. CBC: Recent Labs  Lab 01/07/22 0355 01/08/22 0415 01/09/22 0427 01/10/22 0436 01/11/22 0431  WBC 9.4 9.9 10.7* 9.3 9.3  HGB 10.2* 10.0* 10.8* 9.8* 10.7*  HCT 31.9* 31.3* 34.4* 32.9* 35.2*  MCV 87.6 87.2 87.1 90.1 91.0  PLT 70* 91* 110* 165 192   Cardiac Enzymes: No results for input(s): CKTOTAL, CKMB, CKMBINDEX, TROPONINI in the last 168 hours. CBG: No results for input(s): GLUCAP in the last 168  hours.  Iron Studies: No results for input(s): IRON, TIBC, TRANSFERRIN, FERRITIN in the last 72 hours. Studies/Results: No results found.  Medications: Infusions:  sodium chloride Stopped (01/04/22 2119)   sodium chloride     furosemide 120 mg (01/11/22 0804)    Scheduled Medications:  apixaban  5 mg Oral BID   chlorhexidine  15 mL Mouth Rinse BID   Chlorhexidine Gluconate Cloth  6 each Topical Daily   Gerhardt's butt cream   Topical Daily   mouth rinse  15 mL Mouth Rinse q12n4p   mexiletine  200 mg Oral Q12H   midodrine  10 mg Oral TID WC   pantoprazole  40 mg Oral Daily   sodium chloride flush  10-40 mL Intracatheter Q12H    have reviewed scheduled and prn medications.  Physical Exam: General: sitting in bed, no distress Heart: Normal rate Lungs: Bilateral chest rise with no increased work of breathing Abdomen:soft, Non-tender, non-distended Extremities: 1+ pitting edema present, overall stable Dialysis Access: Temporary HD catheter, right IJ Neurology: Alert awake and following commands. Shaune Pollack Summerlyn Fickel 01/11/2022,8:28 AM  LOS: 11 days

## 2022-01-11 NOTE — Progress Notes (Signed)
PROGRESS NOTE    Patricia Wagner  JKD:[id number] DOB: 1951/04/18 DOA: 12/30/2021 PCP: Janith Lima, MD  Brief Narrative:70/F with history of chronic systolic CHF, NICM, EF 09%, A-fib, CKD 3B, history of neurogenic bladder with urinary retention and chronic Foley rheumatoid arthritis, Raynaud's disease, CVA, COPD admitted to the ICU with respiratory distress/fluid overload with AKI, NSTEMI-troponin peaked at 33, UTI,, did not require mechanical ventilation, subsequently transferred to [facility] course was complicated by acute embolic stroke, started on anticoagulation -Heart failure team and nephrology following -Seen by palliative care as well, kidney function stabilizing   Subjective: -Feels okay, no specific complaints, no nausea or vomiting, denies dyspnea  Assessment and Plan:  * Acute on chronic combined systolic and diastolic CHF (congestive heart failure) ([place])- (present on admission) Mild to moderate mitral regurgitation Acute hypoxic hypercapnic respiratory failure Treated with BiPAP on admission  -echocardiogram 12/31/2021 has demonstrated EF 45-50%, severe RV enlargement with dysfunction  -Complicated by acute kidney injury on CKD 3b -Lasix restarted, dose increased to 120 mg twice daily -Also on midodrine 10 mg 3 times daily -Urine output picked up last 2 to 3 days, creatinine stable in 3.5-4 range -No acute indications for hemodialysis, per nephrology plan to remove HD catheter today  Acute renal failure (ARF) ([place])- (present on admission) AKI on CKD 3b Cardiorenal syndrome w/ ischemic ATN secondary to diuretics Lasix Entresto Jardiance etc. -Renal ultrasound negative for hydronephrosis -IV Lasix, was on albumin as well -Nephrology following, seen by palliative care as well -See discussion above, kidney function appears to be plateauing, somewhat responding to diuretics as well, plan to remove HD catheter noted  Acute CVA (cerebrovascular accident)  [facility]) On admission the patient was found to have dysarthria. CT head was performed and was negative for acute findings - MRI brain noted scattered tine acute infarcts bilaterally consistent with embolic stroke, known history of A-fib, was not on anticoagulation due to history of diverticular bleeds, neurology consulting, now started on Eliquis, MRA negative for large vessel occlusion, carotid duplex noted minimal plaque in the extracranial vessels, LDL 47, A1c is 5.9 -PT OT eval completed, SNF recommended, declined SNF  CAD in native artery, s/p cardiac cath with non obstructive CAD- (present on admission) NSTEMI CAD in native artery, s/p cardiac cath with non obstructive CAD- (present on admission) - Troponin peaked at 5916 -Previous cardiac catheterization done in 2017 showing mild nonobstructive CAD -Treated with IV heparin initially, no chest pain, no plans for left heart cath in the setting of AKI  UTI (urinary tract infection) -Secondary to chronic Foley -Urine culture has grown out Klebsiella pneumoniae and Serratia Marcescens. Both are sensitive to Rocephin, completed course  PAF (paroxysmal atrial fibrillation) ([place])- (present on admission) Status post ablation in 2020 and amiodarone was stopped.  Not on Eliquis due to history of recurrent diverticular GI bleeding.  This has been restarted after further discussion regarding risk-benefit analysis  Thrombocytopenia ([place])- (present on admission) -Platelet count dropped to 55 K, now improving > 100 K, likely related to critical illness  Acute renal failure superimposed on stage 3b chronic kidney disease ([place])- (present on admission) BUN 123, creatinine 3.99(baseline 1.3-1.5).  Patient is on Lasix 80 mg daily. Renal ultrasound is normal. Nephrology is consulted. I appreciate their assistance.   DVT prophylaxis: Apixaban Code Status: Full code Family Communication: No family at bedside, will update daughter Disposition Plan:    Consultants:  Nephrology, cardiology  Procedures:   Antimicrobials:    Objective: Vitals:  01/11/22 0345 01/11/22 0346 01/11/22 0347 01/11/22 0739  BP:    114/69  Pulse:  78  60  Resp: 20  20 (!) 23  Temp:  97.9 F (36.6 C)  98.5 F (36.9 C)  TempSrc:  Oral  Oral  SpO2:  95%  98%  Weight:  112.6 kg    Height:        Intake/Output Summary (Last 24 hours) at 01/11/2022 1150 Last data filed at 01/11/2022 0955 Gross per 24 hour  Intake 653.79 ml  Output 1050 ml  Net -396.21 ml   Filed Weights   01/09/22 0346 01/10/22 0433 01/11/22 0346  Weight: 111.3 kg 109.2 kg 112.6 kg    Examination:  General exam: Obese pleasant female sitting up in bed, AAOx3, no distress HEENT: Positive JVD, right IJ HD catheter CVS: S1-S2, regular rate rhythm Lungs: Decreased breath sounds bases Abdomen: Soft, nontender, bowel sounds present Extremities: 1+ edema Psychiatry:  Mood & affect appropriate.   Data Reviewed:   CBC: Recent Labs  Lab 01/07/22 0355 01/08/22 0415 01/09/22 0427 01/10/22 0436 01/11/22 0431  WBC 9.4 9.9 10.7* 9.3 9.3  HGB 10.2* 10.0* 10.8* 9.8* 10.7*  HCT 31.9* 31.3* 34.4* 32.9* 35.2*  MCV 87.6 87.2 87.1 90.1 91.0  PLT 70* 91* 110* 165 818   Basic Metabolic Panel: Recent Labs  Lab 01/07/22 0355 01/08/22 0415 01/09/22 0427 01/10/22 0436 01/11/22 0431 01/11/22 0440  NA 137 139 139 142 143 142  K 3.7 3.8 4.1 3.3* 4.1 4.1  CL 99 100 99 101 99 98  CO2 25 27 25 31  32 32  GLUCOSE 92 94 89 98 91 90  BUN 112* 112* 118* 114* 115* 115*  CREATININE 3.88* 3.73* 4.03* 3.71* 4.01* 3.94*  CALCIUM 8.3* 8.5* 8.6* 8.4* 8.7* 8.6*  PHOS 5.3* 5.0* 4.8* 4.8*  --  4.6   GFR: Estimated Creatinine Clearance: 16.8 mL/min (A) (by C-G formula based on SCr of 3.94 mg/dL (H)). Liver Function Tests: Recent Labs  Lab 01/07/22 0355 01/08/22 0415 01/09/22 0427 01/10/22 0436 01/11/22 0440  ALBUMIN 2.5* 3.0* 2.8* 2.4* 2.7*   No results for input(s): LIPASE, AMYLASE in  the last 168 hours. No results for input(s): AMMONIA in the last 168 hours. Coagulation Profile: No results for input(s): INR, PROTIME in the last 168 hours. Cardiac Enzymes: No results for input(s): CKTOTAL, CKMB, CKMBINDEX, TROPONINI in the last 168 hours. BNP (last 3 results) No results for input(s): PROBNP in the last 8760 hours. HbA1C: No results for input(s): HGBA1C in the last 72 hours.  CBG: No results for input(s): GLUCAP in the last 168 hours.  Lipid Profile: No results for input(s): CHOL, HDL, LDLCALC, TRIG, CHOLHDL, LDLDIRECT in the last 72 hours.  Thyroid Function Tests: No results for input(s): TSH, T4TOTAL, FREET4, T3FREE, THYROIDAB in the last 72 hours. Anemia Panel: No results for input(s): VITAMINB12, FOLATE, FERRITIN, TIBC, IRON, RETICCTPCT in the last 72 hours. Urine analysis:    Component Value Date/Time   COLORURINE YELLOW 01/03/2022 1124   APPEARANCEUR CLOUDY (A) 01/03/2022 1124   LABSPEC 1.011 01/03/2022 1124   PHURINE 5.0 01/03/2022 1124   GLUCOSEU NEGATIVE 01/03/2022 1124   GLUCOSEU NEGATIVE 05/22/2021 1435   HGBUR SMALL (A) 01/03/2022 1124   BILIRUBINUR NEGATIVE 01/03/2022 1124   KETONESUR NEGATIVE 01/03/2022 1124   PROTEINUR 30 (A) 01/03/2022 1124   UROBILINOGEN 0.2 05/22/2021 1435   NITRITE NEGATIVE 01/03/2022 1124   LEUKOCYTESUR LARGE (A) 01/03/2022 1124   Sepsis Labs: @LABRCNTIP (procalcitonin:4,lacticidven:4)  ) No  results found for this or any previous visit (from the past 240 hour(s)).    Radiology Studies: No results found.   Scheduled Meds:  apixaban  5 mg Oral BID   chlorhexidine  15 mL Mouth Rinse BID   Chlorhexidine Gluconate Cloth  6 each Topical Daily   Gerhardt's butt cream   Topical Daily   mouth rinse  15 mL Mouth Rinse q12n4p   mexiletine  200 mg Oral Q12H   midodrine  10 mg Oral TID WC   pantoprazole  40 mg Oral Daily   sodium chloride flush  10-40 mL Intracatheter Q12H   Continuous Infusions:  sodium chloride  Stopped (01/04/22 2119)   sodium chloride     furosemide 120 mg (01/11/22 0804)     LOS: 11 days    Time spent: 65min  Domenic Polite, MD [facility]   01/11/2022, 11:50 AM

## 2022-01-11 NOTE — Progress Notes (Signed)
Unable to pull back on central line, iv team consulted.

## 2022-01-11 NOTE — Progress Notes (Signed)
There was consult for D/C non tunneled HD cath. Checking the CVP via pigtail of HD. Communicated Dr. Aundra Dubin regarding this matter and Dr. Aundra Dubin was okay to remove. Informed patient's RN regarding this matter. Removed HD cath and informed the patient should be staying in the bed for 30 min. HS [employer]

## 2022-01-12 DIAGNOSIS — Z7189 Other specified counseling: Secondary | ICD-10-CM

## 2022-01-12 DIAGNOSIS — I5043 Acute on chronic combined systolic (congestive) and diastolic (congestive) heart failure: Secondary | ICD-10-CM | POA: Diagnosis not present

## 2022-01-12 DIAGNOSIS — Z515 Encounter for palliative care: Secondary | ICD-10-CM

## 2022-01-12 DIAGNOSIS — I639 Cerebral infarction, unspecified: Secondary | ICD-10-CM

## 2022-01-12 DIAGNOSIS — N1832 Chronic kidney disease, stage 3b: Secondary | ICD-10-CM

## 2022-01-12 LAB — CBC
HCT: 34.1 % — ABNORMAL LOW (ref 36.0–46.0)
Hemoglobin: 10.1 g/dL — ABNORMAL LOW (ref 12.0–15.0)
MCH: 27.2 pg (ref 26.0–34.0)
MCHC: 29.6 g/dL — ABNORMAL LOW (ref 30.0–36.0)
MCV: 91.7 fL (ref 80.0–100.0)
Platelets: 187 10*3/uL (ref 150–400)
RBC: 3.72 MIL/uL — ABNORMAL LOW (ref 3.87–5.11)
RDW: 15.8 % — ABNORMAL HIGH (ref 11.5–15.5)
WBC: 8.6 10*3/uL (ref 4.0–10.5)
nRBC: 0 % (ref 0.0–0.2)

## 2022-01-12 LAB — RENAL FUNCTION PANEL
Albumin: 2.6 g/dL — ABNORMAL LOW (ref 3.5–5.0)
Anion gap: 11 (ref 5–15)
BUN: 111 mg/dL — ABNORMAL HIGH (ref 8–23)
CO2: 35 mmol/L — ABNORMAL HIGH (ref 22–32)
Calcium: 8.3 mg/dL — ABNORMAL LOW (ref 8.9–10.3)
Chloride: 96 mmol/L — ABNORMAL LOW (ref 98–111)
Creatinine, Ser: 3.57 mg/dL — ABNORMAL HIGH (ref 0.44–1.00)
GFR, Estimated: 13 mL/min — ABNORMAL LOW (ref >60)
Glucose, Bld: 92 mg/dL (ref 70–99)
Phosphorus: 4 mg/dL (ref 2.5–4.6)
Potassium: 3.3 mmol/L — ABNORMAL LOW (ref 3.5–5.1)
Sodium: 142 mmol/L (ref 135–145)

## 2022-01-12 MED ORDER — TORSEMIDE 20 MG PO TABS
80.0000 mg | ORAL_TABLET | Freq: Two times a day (BID) | ORAL | Status: DC
  Administered 2022-01-12 – 2022-01-17 (×11): 80 mg via ORAL
  Filled 2022-01-12 (×11): qty 4

## 2022-01-12 MED ORDER — POTASSIUM CHLORIDE CRYS ER 20 MEQ PO TBCR
40.0000 meq | EXTENDED_RELEASE_TABLET | Freq: Two times a day (BID) | ORAL | Status: DC
  Administered 2022-01-12 (×2): 40 meq via ORAL
  Filled 2022-01-12 (×2): qty 2

## 2022-01-12 NOTE — Progress Notes (Signed)
Mobility Specialist Progress Note   01/12/22 1440  Mobility  Activity Transferred to/from [facility]  Level of Assistance Minimal assist, patient does 75% or more (+2 minA)  Assistive Device Front wheel walker  Distance Ambulated (ft) 2 ft  Activity Response Tolerated well  $Mobility charge 1 Mobility   Pt requesting to use BSC but fearful about getting up. Mod I for bed mobility but +2 min A to SPT for safety and pt's reassurance. Successful BM. Left on [facility] w/ call bell in reach and NT in room.   Holland Falling Mobility Specialist Phone Number [phone]

## 2022-01-12 NOTE — Progress Notes (Addendum)
PROGRESS NOTE    Patricia Wagner  ZLD:[id number] DOB: 27-Dec-1950 DOA: 12/30/2021 PCP: Janith Lima, MD  Brief Narrative:70/F with history of chronic systolic CHF, NICM, EF 90%, A-fib, CKD 3B, history of neurogenic bladder with urinary retention and chronic Foley rheumatoid arthritis, Raynaud's disease, CVA, COPD admitted to the ICU with respiratory distress/fluid overload with AKI, NSTEMI-troponin peaked at 73, UTI,, did not require mechanical ventilation, subsequently transferred to [facility] course was complicated by acute embolic stroke, started on anticoagulation -Heart failure team and nephrology following -Seen by palliative care as well, kidney function stabilizing, slowly improving with diuresis   Subjective: -Feels okay overall, wants to get up and move, declines rehab  Assessment and Plan:  * Acute on chronic combined systolic and diastolic CHF (congestive heart failure) ([place])- (present on admission) Mild to moderate mitral regurgitation Acute hypoxic hypercapnic respiratory failure Treated with BiPAP on admission  -echocardiogram 12/31/2021 has demonstrated EF 45-50%, severe RV enlargement with dysfunction  -Complicated by acute kidney injury on CKD 3b -Lasix restarted, dose increased to 120 mg twice daily, now switched to torsemide -Also on midodrine 10 mg 3 times daily -Slowly improving, I/os are inaccurate, started on torsemide -No acute indications for hemodialysis, HD catheter removed 2/18  Acute renal failure (ARF) ([place])- (present on admission) AKI on CKD 3b Cardiorenal syndrome w/ ischemic ATN secondary to diuretics Lasix Entresto Jardiance etc. -Renal ultrasound negative for hydronephrosis -Diuresed with IV Lasix and albumin, now on torsemide -Nephrology following, seen by palliative care as well -See discussion above, kidney function slowly improving, HD catheter removed  Acute CVA (cerebrovascular accident) [facility]) On admission the patient was found  to have dysarthria. CT head was performed and was negative for acute findings - MRI brain noted scattered tine acute infarcts bilaterally consistent with embolic stroke, known history of A-fib, was not on anticoagulation due to history of diverticular bleeds, neurology consulting, now started on Eliquis, MRA negative for large vessel occlusion, carotid duplex noted minimal plaque in the extracranial vessels, LDL 47, A1c is 5.9 -PT OT eval completed, SNF recommended, declined SNF  CAD in native artery, s/p cardiac cath with non obstructive CAD- (present on admission) NSTEMI CAD in native artery, s/p cardiac cath with non obstructive CAD- (present on admission) - Troponin peaked at 5916 -Previous cardiac catheterization done in 2017 showing mild nonobstructive CAD -Treated with IV heparin initially, no chest pain, no plans for left heart cath in the setting of AKI  UTI (urinary tract infection) -Secondary to chronic Foley -Urine culture has grown out Klebsiella pneumoniae and Serratia Marcescens. Both are sensitive to Rocephin, completed course  PAF (paroxysmal atrial fibrillation) ([place])- (present on admission) Status post ablation in 2020 and amiodarone was stopped.  Not on Eliquis due to history of recurrent diverticular GI bleeding.  This has been restarted after further discussion regarding risk-benefit analysis  Thrombocytopenia ([place])- (present on admission) -Platelet count dropped to 55 K, now improving > 100 K, likely related to critical illness  Acute renal failure superimposed on stage 3b chronic kidney disease ([place])- (present on admission) BUN 123, creatinine 3.99(baseline 1.3-1.5).  Patient is on Lasix 80 mg daily. Renal ultrasound is normal. Nephrology is consulted. I appreciate their assistance.   DVT prophylaxis: Apixaban Code Status: Full code Family Communication: Discussed patient in detail, no family at bedside, called and updated daughter [name] Disposition Plan: To be  determined, declines rehab, requires two-person assistance but lives alone  Consultants:  Nephrology, cardiology  Procedures:   Antimicrobials:  Objective: Vitals:   01/11/22 2015 01/12/22 0045 01/12/22 0405 01/12/22 0718  BP: (!) 117/54 116/65 (!) 122/59 122/73  Pulse: 79 79 89 (!) 57  Resp: 20 18 20 19   Temp: 98.9 F (37.2 C) 97.6 F (36.4 C) 98.5 F (36.9 C) 97.7 F (36.5 C)  TempSrc: Oral Oral Oral Oral  SpO2: 97% 97% 99% 97%  Weight:   110 kg   Height:        Intake/Output Summary (Last 24 hours) at 01/12/2022 1155 Last data filed at 01/12/2022 5916 Gross per 24 hour  Intake 485 ml  Output 700 ml  Net -215 ml   Filed Weights   01/10/22 0433 01/11/22 0346 01/12/22 0405  Weight: 109.2 kg 112.6 kg 110 kg    Examination:  General exam: Obese pleasant female sitting up in bed, AAOx3, no distress HEENT: Positive JVD, right IJ HD catheter removed CVS: S1-S2, regular rate rhythm Lungs: Decreased breath sounds to bases otherwise clear Abdomen: Soft, nontender, bowel sounds present Extremities: Trace edema  Psychiatry:  Mood & affect appropriate.   Data Reviewed:   CBC: Recent Labs  Lab 01/08/22 0415 01/09/22 0427 01/10/22 0436 01/11/22 0431 01/12/22 0513  WBC 9.9 10.7* 9.3 9.3 8.6  HGB 10.0* 10.8* 9.8* 10.7* 10.1*  HCT 31.3* 34.4* 32.9* 35.2* 34.1*  MCV 87.2 87.1 90.1 91.0 91.7  PLT 91* 110* 165 192 384   Basic Metabolic Panel: Recent Labs  Lab 01/08/22 0415 01/09/22 0427 01/10/22 0436 01/11/22 0431 01/11/22 0440 01/12/22 0513  NA 139 139 142 143 142 142  K 3.8 4.1 3.3* 4.1 4.1 3.3*  CL 100 99 101 99 98 96*  CO2 27 25 31  32 32 35*  GLUCOSE 94 89 98 91 90 92  BUN 112* 118* 114* 115* 115* 111*  CREATININE 3.73* 4.03* 3.71* 4.01* 3.94* 3.57*  CALCIUM 8.5* 8.6* 8.4* 8.7* 8.6* 8.3*  PHOS 5.0* 4.8* 4.8*  --  4.6 4.0   GFR: Estimated Creatinine Clearance: 18.3 mL/min (A) (by C-G formula based on SCr of 3.57 mg/dL (H)). Liver Function  Tests: Recent Labs  Lab 01/08/22 0415 01/09/22 0427 01/10/22 0436 01/11/22 0440 01/12/22 0513  ALBUMIN 3.0* 2.8* 2.4* 2.7* 2.6*   No results for input(s): LIPASE, AMYLASE in the last 168 hours. No results for input(s): AMMONIA in the last 168 hours. Coagulation Profile: No results for input(s): INR, PROTIME in the last 168 hours. Cardiac Enzymes: No results for input(s): CKTOTAL, CKMB, CKMBINDEX, TROPONINI in the last 168 hours. BNP (last 3 results) No results for input(s): PROBNP in the last 8760 hours. HbA1C: No results for input(s): HGBA1C in the last 72 hours.  CBG: No results for input(s): GLUCAP in the last 168 hours.  Lipid Profile: No results for input(s): CHOL, HDL, LDLCALC, TRIG, CHOLHDL, LDLDIRECT in the last 72 hours.  Thyroid Function Tests: No results for input(s): TSH, T4TOTAL, FREET4, T3FREE, THYROIDAB in the last 72 hours. Anemia Panel: No results for input(s): VITAMINB12, FOLATE, FERRITIN, TIBC, IRON, RETICCTPCT in the last 72 hours. Urine analysis:    Component Value Date/Time   COLORURINE YELLOW 01/03/2022 1124   APPEARANCEUR CLOUDY (A) 01/03/2022 1124   LABSPEC 1.011 01/03/2022 1124   PHURINE 5.0 01/03/2022 1124   GLUCOSEU NEGATIVE 01/03/2022 1124   GLUCOSEU NEGATIVE 05/22/2021 1435   HGBUR SMALL (A) 01/03/2022 1124   BILIRUBINUR NEGATIVE 01/03/2022 1124   KETONESUR NEGATIVE 01/03/2022 1124   PROTEINUR 30 (A) 01/03/2022 1124   UROBILINOGEN 0.2 05/22/2021 1435   NITRITE NEGATIVE  01/03/2022 1124   LEUKOCYTESUR LARGE (A) 01/03/2022 1124   Sepsis Labs: @LABRCNTIP (procalcitonin:4,lacticidven:4)  ) No results found for this or any previous visit (from the past 240 hour(s)).    Radiology Studies: No results found.   Scheduled Meds:  apixaban  5 mg Oral BID   chlorhexidine  15 mL Mouth Rinse BID   Chlorhexidine Gluconate Cloth  6 each Topical Daily   Gerhardt's butt cream   Topical Daily   mouth rinse  15 mL Mouth Rinse q12n4p   mexiletine   200 mg Oral Q12H   midodrine  10 mg Oral TID WC   pantoprazole  40 mg Oral Daily   potassium chloride  40 mEq Oral BID   sodium chloride flush  10-40 mL Intracatheter Q12H   torsemide  80 mg Oral BID   Continuous Infusions:  sodium chloride Stopped (01/04/22 2119)   sodium chloride       LOS: 12 days    Time spent: 45min  Domenic Polite, MD [facility]   01/12/2022, 11:55 AM

## 2022-01-12 NOTE — Progress Notes (Signed)
Mobility Specialist Progress Note   01/12/22 1528  Mobility  Activity Transferred to/from [facility]  Level of Assistance Minimal assist, patient does 75% or more  Assistive Device Front wheel walker  Distance Ambulated (ft) 2 ft  Activity Response Tolerated well  $Mobility charge 1 Mobility    NT requesting assistance w/ pericare and getting pt back to bed. No mobility complaint during tx but c/o soreness on backside. Left in bed w/ NT left in the room.  Holland Falling Mobility Specialist Phone Number [phone]

## 2022-01-12 NOTE — Progress Notes (Signed)
[facility] NEPHROLOGY PROGRESS NOTE  Assessment/ Plan: Pt is a 71 y.o. yo female  with a PMH chronic systolic CHF, A-fib not on Eliquis due to history of diverticular GI bleeding, PVCs, CKD stage IIIb, chronic systolic CHF due to nonischemic cardiomyopathy (EF 35 to 40%), rheumatoid arthritis, Raynaud's disease, CVA, COPD, hypertension, hyperlipidemia.  #Acute kidney injury on CKD stage IIIb: Possible ATN but also cardiorenal syndrome.  Kidney ultrasound without hydronephrosis.  Urine is cloudy with many bacteria and cells.  ANA, anti-GBM antibody, ANCA, complements unremarkable.    She has expressed that she did not want dialysis in the past but now more open to it.  She really wants to give her kidneys the best shot for recovery and I think this is reasonable.  We can continue to hold off on dialysis.  Per patient's request we removed her temporary dialysis catheter.  Crt has improved slightly but urine output has been less.  I am trying to maintain her volume status for now.  We will switch to oral diuretics -Stop IV Lasix and start torsemide 80 mg twice daily -Consider the addition of metolazone if needed -Can adjust doses of diuretics as needed -Assess for dialysis needs daily; has not been showing signs of uremia.  More tired today.  We will continue to monitor  #Acute hypoxic and hypercapnic respiratory failure: Volume management with diuretics   #Chronic combined systolic and diastolic CHF: Continue to hold Entresto and Jardiance because of AKI.  Diuretics as above.  Largely right-sided.  Cardiology is following.  # UTI: Urine culture is growing Klebsiella and Serratia, treated with ceftriaxone.  #Anemia of chronic disease/CKD: Hemoglobin at goal.  #Hypotension: She is on midodrine.  Monitor BP.   #Acute stroke: Seen by neurology team.    Subjective: Stable overnight. More tired this morning. HD cath removed. No complaints.  Objective Vital signs in last 24  hours: Vitals:   01/11/22 2015 01/12/22 0045 01/12/22 0405 01/12/22 0718  BP: (!) 117/54 116/65 (!) 122/59 122/73  Pulse: 79 79 89 (!) 57  Resp: 20 18 20 19   Temp: 98.9 F (37.2 C) 97.6 F (36.4 C) 98.5 F (36.9 C) 97.7 F (36.5 C)  TempSrc: Oral Oral Oral Oral  SpO2: 97% 97% 99% 97%  Weight:   110 kg   Height:       Weight change: -2.6 kg  Intake/Output Summary (Last 24 hours) at 01/12/2022 2229 Last data filed at 01/12/2022 7989 Gross per 24 hour  Intake 617.29 ml  Output 700 ml  Net -82.71 ml       Labs: Basic Metabolic Panel: Recent Labs  Lab 01/10/22 0436 01/11/22 0431 01/11/22 0440 01/12/22 0513  NA 142 143 142 142  K 3.3* 4.1 4.1 3.3*  CL 101 99 98 96*  CO2 31 32 32 35*  GLUCOSE 98 91 90 92  BUN 114* 115* 115* 111*  CREATININE 3.71* 4.01* 3.94* 3.57*  CALCIUM 8.4* 8.7* 8.6* 8.3*  PHOS 4.8*  --  4.6 4.0   Liver Function Tests: Recent Labs  Lab 01/10/22 0436 01/11/22 0440 01/12/22 0513  ALBUMIN 2.4* 2.7* 2.6*   No results for input(s): LIPASE, AMYLASE in the last 168 hours. No results for input(s): AMMONIA in the last 168 hours. CBC: Recent Labs  Lab 01/08/22 0415 01/09/22 0427 01/10/22 0436 01/11/22 0431 01/12/22 0513  WBC 9.9 10.7* 9.3 9.3 8.6  HGB 10.0* 10.8* 9.8* 10.7* 10.1*  HCT 31.3* 34.4* 32.9* 35.2* 34.1*  MCV 87.2 87.1  90.1 91.0 91.7  PLT 91* 110* 165 192 187   Cardiac Enzymes: No results for input(s): CKTOTAL, CKMB, CKMBINDEX, TROPONINI in the last 168 hours. CBG: No results for input(s): GLUCAP in the last 168 hours.  Iron Studies: No results for input(s): IRON, TIBC, TRANSFERRIN, FERRITIN in the last 72 hours. Studies/Results: No results found.  Medications: Infusions:  sodium chloride Stopped (01/04/22 2119)   sodium chloride      Scheduled Medications:  apixaban  5 mg Oral BID   chlorhexidine  15 mL Mouth Rinse BID   Chlorhexidine Gluconate Cloth  6 each Topical Daily   Gerhardt's butt cream   Topical Daily    mouth rinse  15 mL Mouth Rinse q12n4p   mexiletine  200 mg Oral Q12H   midodrine  10 mg Oral TID WC   pantoprazole  40 mg Oral Daily   potassium chloride  40 mEq Oral BID   sodium chloride flush  10-40 mL Intracatheter Q12H   torsemide  80 mg Oral BID    have reviewed scheduled and prn medications.  Physical Exam: General: lying in bed, no distress Heart: Normal rate Lungs: Bilateral chest rise with no increased work of breathing Abdomen:soft, Non-tender, non-distended Extremities: 1+ pitting edema present, overall stable Dialysis Access: Temporary HD catheter, right IJ Neurology: Alert awake and following commands. [name] 01/12/2022,9:22 AM  LOS: 12 days

## 2022-01-13 DIAGNOSIS — I5043 Acute on chronic combined systolic (congestive) and diastolic (congestive) heart failure: Secondary | ICD-10-CM | POA: Diagnosis not present

## 2022-01-13 LAB — RENAL FUNCTION PANEL
Albumin: 2.6 g/dL — ABNORMAL LOW (ref 3.5–5.0)
Anion gap: 13 (ref 5–15)
BUN: 107 mg/dL — ABNORMAL HIGH (ref 8–23)
CO2: 33 mmol/L — ABNORMAL HIGH (ref 22–32)
Calcium: 8.5 mg/dL — ABNORMAL LOW (ref 8.9–10.3)
Chloride: 95 mmol/L — ABNORMAL LOW (ref 98–111)
Creatinine, Ser: 3.56 mg/dL — ABNORMAL HIGH (ref 0.44–1.00)
GFR, Estimated: 13 mL/min — ABNORMAL LOW (ref >60)
Glucose, Bld: 90 mg/dL (ref 70–99)
Phosphorus: 3.2 mg/dL (ref 2.5–4.6)
Potassium: 3.2 mmol/L — ABNORMAL LOW (ref 3.5–5.1)
Sodium: 141 mmol/L (ref 135–145)

## 2022-01-13 LAB — CBC
HCT: 32.7 % — ABNORMAL LOW (ref 36.0–46.0)
Hemoglobin: 9.7 g/dL — ABNORMAL LOW (ref 12.0–15.0)
MCH: 27.1 pg (ref 26.0–34.0)
MCHC: 29.7 g/dL — ABNORMAL LOW (ref 30.0–36.0)
MCV: 91.3 fL (ref 80.0–100.0)
Platelets: 205 10*3/uL (ref 150–400)
RBC: 3.58 MIL/uL — ABNORMAL LOW (ref 3.87–5.11)
RDW: 15.5 % (ref 11.5–15.5)
WBC: 10.2 10*3/uL (ref 4.0–10.5)
nRBC: 0 % (ref 0.0–0.2)

## 2022-01-13 LAB — MAGNESIUM: Magnesium: 1.5 mg/dL — ABNORMAL LOW (ref 1.7–2.4)

## 2022-01-13 MED ORDER — POTASSIUM CHLORIDE CRYS ER 20 MEQ PO TBCR
40.0000 meq | EXTENDED_RELEASE_TABLET | Freq: Once | ORAL | Status: AC
  Administered 2022-01-13: 40 meq via ORAL
  Filled 2022-01-13: qty 2

## 2022-01-13 MED ORDER — MAGNESIUM SULFATE 2 GM/50ML IV SOLN
2.0000 g | Freq: Once | INTRAVENOUS | Status: AC
  Administered 2022-01-13: 2 g via INTRAVENOUS
  Filled 2022-01-13: qty 50

## 2022-01-13 MED ORDER — MEXILETINE HCL 150 MG PO CAPS
300.0000 mg | ORAL_CAPSULE | Freq: Two times a day (BID) | ORAL | Status: DC
Start: 2022-01-13 — End: 2022-01-18
  Administered 2022-01-13 – 2022-01-18 (×10): 300 mg via ORAL
  Filled 2022-01-13 (×10): qty 2

## 2022-01-13 MED ORDER — POTASSIUM CHLORIDE CRYS ER 20 MEQ PO TBCR
40.0000 meq | EXTENDED_RELEASE_TABLET | Freq: Two times a day (BID) | ORAL | Status: AC
  Administered 2022-01-13 (×2): 40 meq via ORAL
  Filled 2022-01-13 (×2): qty 2

## 2022-01-13 NOTE — Care Management Important Message (Signed)
Important Message  Patient Details  Name: Patricia Wagner MRN: [id number] Date of Birth: 11/16/1951   Medicare Important Message Given:  Yes     [name] 01/13/2022, 8:29 AM

## 2022-01-13 NOTE — Progress Notes (Addendum)
Advanced Heart Failure Rounding Note  PCP-Cardiologist: Glori Bickers, MD   Subjective:     R IJ HD catheter placed 02/09. HD cath now out.  Scr peaked at 5.24, trending down to 3.56  Nephrology dosing diuretics. Now on po Torsemide.  Feels okay. Denies dyspnea at rest. No CP.   Refusing SNF. States she has neighbors and friends in the community that can assist her. Needs assistance with minimal activity.  Objective:    102.3 kg Body mass index is 38.71 kg/m.   Vital Signs:   Temp:  [97.6 F (36.4 C)-98.9 F (37.2 C)] 97.6 F (36.4 C) (02/20 0411) Pulse Rate:  [75-89] 75 (02/20 0411) Resp:  [17-20] 17 (02/20 0411) BP: (112-133)/(56-84) 133/62 (02/20 0411) SpO2:  [90 %-95 %] 95 % (02/20 0010) Weight:  [102.3 kg] 102.3 kg (02/20 0010) Last BM Date : 01/12/22  Weight change: Filed Weights   01/11/22 0346 01/12/22 0405 01/13/22 0010  Weight: 112.6 kg 110 kg 102.3 kg    Intake/Output:   Intake/Output Summary (Last 24 hours) at 01/13/2022 1003 Last data filed at 01/13/2022 0800 Gross per 24 hour  Intake 360 ml  Output 1425 ml  Net -1065 ml      Physical Exam   General:  Fatigued appearing. HEENT: normal Neck: supple. JVP 8-10 cm. Carotids 2+ bilat; no bruits.  Cor: PMI nondisplaced. Regular rate & rhythm. No rubs, gallops or murmurs. Lungs: on 2L [place], decreased breath sounds Abdomen: soft, nontender, nondistended. No hepatosplenomegaly.  Extremities: no cyanosis, clubbing, rash, 1+ edema, UNNA on Neuro: alert & orientedx3, cranial nerves grossly intact. moves all 4 extremities w/o difficulty. Affect flat     Telemetry   SR 60s-80s, 20-30 PVCs/min (personally reviewed)  Labs    CBC Recent Labs    01/12/22 0513 01/13/22 0338  WBC 8.6 10.2  HGB 10.1* 9.7*  HCT 34.1* 32.7*  MCV 91.7 91.3  PLT 187 588   Basic Metabolic Panel Recent Labs    01/12/22 0513 01/13/22 0338  NA 142 141  K 3.3* 3.2*  CL 96* 95*  CO2 35* 33*  GLUCOSE 92 90   BUN 111* 107*  CREATININE 3.57* 3.56*  CALCIUM 8.3* 8.5*  PHOS 4.0 3.2   Liver Function Tests Recent Labs    01/12/22 0513 01/13/22 0338  ALBUMIN 2.6* 2.6*   No results for input(s): LIPASE, AMYLASE in the last 72 hours. Cardiac Enzymes No results for input(s): CKTOTAL, CKMB, CKMBINDEX, TROPONINI in the last 72 hours.  BNP: BNP (last 3 results) Recent Labs    12/30/21 2342  BNP >4,500.0*    ProBNP (last 3 results) No results for input(s): PROBNP in the last 8760 hours.   D-Dimer No results for input(s): DDIMER in the last 72 hours. Hemoglobin A1C No results for input(s): HGBA1C in the last 72 hours.  Fasting Lipid Panel No results for input(s): CHOL, HDL, LDLCALC, TRIG, CHOLHDL, LDLDIRECT in the last 72 hours.  Thyroid Function Tests No results for input(s): TSH, T4TOTAL, T3FREE, THYROIDAB in the last 72 hours.  Invalid input(s): FREET3  Other results:   Imaging    No results found.   Medications:     Scheduled Medications:  apixaban  5 mg Oral BID   chlorhexidine  15 mL Mouth Rinse BID   Chlorhexidine Gluconate Cloth  6 each Topical Daily   Gerhardt's butt cream   Topical Daily   mouth rinse  15 mL Mouth Rinse q12n4p   mexiletine  200 mg  Oral Q12H   midodrine  10 mg Oral TID WC   pantoprazole  40 mg Oral Daily   potassium chloride  40 mEq Oral BID   sodium chloride flush  10-40 mL Intracatheter Q12H   torsemide  80 mg Oral BID    Infusions:  sodium chloride Stopped (01/04/22 2119)   sodium chloride      PRN Medications: sodium chloride, acetaminophen **OR** acetaminophen, heparin, loperamide, sodium chloride flush    Patient Profile   71 y/o female w/ chronic biventricular heart failure/ nonischemic CM, felt likely tachymediated from Afib/PVCs, Stage IIIb CKD and neurogenic bladder w/ urinary retention w/ chronic foley, admitted w/ acute hypoxic respiratory failure and AKI, in setting of UTI and a/c CHF.   Assessment/Plan   1.  Acute on Chronic Biventricular Heart Failure, w/ Predominant RV Failure - NICM, LHC 2017 w/ mild nonobstructive CAD - Echo 6/18 EF 15% in setting of tachy CM from AF - Echo 02/2018: EF 40-45%, grade 1 DD, RV normal - Echo 8/20 45-50% Personally reviewed - Echo 9/22 EF read as 35-40% (I think closer to 30%) - suspect tachymediated CM from Afib and high PVC burden  - Previous Amyloid w/u negative with negative fat pad biopsy and PYP scan (2018).  - Echo this admit, EF 40-45%, RV severely enlarged w/ moderately reduced systolic function  - Now w/ a/c CHF c/b AKI on Stage lllb CKD  - Volume improved but remains elevated. - Nephrology dosing diuretics. On 80 mg Torsemide BID. - Unable to tolerate GDMT w/ hypotension and AKI. Home entresto and jardiance on hold - Continue midodrine 10 tid for BP support, titrate further if needed  - UNNA boots   2. AKI on CKD Stage IIIb - Baseline SCr 1.4-1.6 - Scr peaked at 5.25, 3.56 today   - nephrology following, felt to be ATN + component of cardiorenal syndrome. - Nephrology dosing diuretics as above. She does not want dialysis   3. Acute Hypoxic Respiratory Failure - O2 sats 70-80s on admit, requiring BiPAP  - Flu and Covid negative  - admit CXR showed vascular congestion but diuretics held on admit due to suspected hypovolemic AKI in setting of diarrhea. Now diuresing with IV lasix. - O2 now stable on 2L [place]   4. UTI  - chronic foley for neurogenic bladder w/ urinary retention  - UA + on admit - UCx + Klebsiella Pneumoniae + Serratia Marcescens  - Treating w/ ceftriaxone (sensitive on susceptibilities) per primary team   5. Tricuspid Regurgitation  - trivial TR on echo 9/22 - severe on echo this admit - likely functional, RV severely enlarged    6. PVCs/ NSVT - Zio 7/22 showed 7.0% burden, started on mexiletine (held on admit) - Amio gtt off.  - Increased PVCs today. K 3.2. Supp. Check Mag. - Increase mexiletine to 300 mg BID   7. NSTEMI   - Hs trop 5,916>>5,616>>5,916 - in the setting of acute renal failure  - Denies CP, EKG nonischemic. LHC 2017 minimal CAD - No plans for LHC    8. PAF - Hx AF ablation - NSR w/ PVCs - Had not been on anticoagulation given h/o recurrent GIBs d/t diverticular disease.  - Now on Eliquis 5 mg BID given embolic CVA this admit. Watch for bleeding. Hgb 9.7   9. Thrombocytopenia - Plt 98 on admit, down to 50s-60s. Improving last few days. Up to 205 - off heparin, but doubt HIT given plts were low at baseline - suspect  2/2 critical illness   10. Acute CVA -CT head on 02/06 with old left cerebellar infarct. No acute finding. -Initially thought to be d/t hypoxia.  -MRI brain 02/12 with scattered punctate acute b/l cerebral and right cerebellar infarcts suggesting central embolic source -MRA with no intracranial LVO, chronically occluded left vertebral artery -Neurology evaluated. Felt to be due to afib. Now on anticoagulation.   PT/OT recommending SNF for rehab but she refuses. States friends in area can assist her. Agrees to [facility] PT.  Palliative Care on board for [place] discussion. Remains full code.  Length of Stay: [address] [place], PA-C  01/13/2022, 10:03 AM  Advanced Heart Failure Team Pager [phone] (M-F; 7a - 5p)  Please contact [facility] for night-coverage after hours (5p -7a ) and weekends on [email or web address]  Patient seen and examined with the above-signed Advanced Practice Provider and/or Housestaff. I personally reviewed laboratory data, imaging studies and relevant notes. I independently examined the patient and formulated the important aspects of the plan. I have edited the note to reflect any of my changes or salient points. I have personally discussed the plan with the patient and/or family.  She reports feeling better. Denies CP or SOB. Weight coming down on po torsemide but doubt weight is accurate. Still with frequent PVCs. Adamant that she is not going to  SNF.  General:  Weak appearing. No resp difficulty HEENT: normal Neck: supple.JVP 10 . Carotids 2+ bilat; no bruits. No lymphadenopathy or thryomegaly appreciated. Cor: PMI nondisplaced. Regular rate & rhythm. No rubs, gallops or murmurs. Lungs: clear Abdomen: soft, nontender, nondistended. No hepatosplenomegaly. No bruits or masses. Good bowel sounds. Extremities: no cyanosis, clubbing, rash, 1-2+ edema Neuro: alert & orientedx3, cranial nerves grossly intact. moves all 4 extremities w/o difficulty. Affect pleasant  Volume status improving but still with some fluid on board (doubt weight today is accurate). Continue diuresis per Renal. Still with frequent PVCs will increase mexilitene to 300 bid.   Long talk with her about my concern over her going home given how weak she is but she is adamant that she will not go to SNF and that friends/neighbors will help.   Total time spent > 35 minutes. Over half that time spent discussing above.   Glori Bickers, MD  2:29 PM

## 2022-01-13 NOTE — TOC Initial Note (Signed)
Transition of Care [facility]) - Initial/Assessment Note    Patient Details  Name: Patricia Wagner MRN: [id number] Date of Birth: 04/16/1951  Transition of Care [facility]) CM/SW Contact:    Erenest Rasher, RN Phone Number: [phone] 01/13/2022, 2:56 PM  Clinical Narrative:                  HF TOC CM spoke to pt at bedside. Explained PT/OT recommended SNF rehab. Pt declines SNF, states she wants to return home. States she cannot afford to hire private duty caregiver to assist in home. And she does not have family to assist. Pt states she has RW with seat and cane at home. Pt will need wheelchair and oxygen at dc. Wants to work with PT/OT so she can walk and be able to ambulate independently at home. CM sent message to attending for PT/OT to continue to work with pt while in hospital.     Expected Discharge Plan: [place] Barriers to Discharge: Continued Medical Work up   Patient Goals and CMS Choice Patient states their goals for this hospitalization and ongoing recovery are:: wants to return home CMS Medicare.gov Compare Post Acute Care list provided to:: Patient Choice offered to / list presented to : Patient  Expected Discharge Plan and Services Expected Discharge Plan: [place] In-house Referral: Clinical Social Work Discharge Planning Services: CM Consult Post Acute Care Choice: [place] arrangements for the past 2 months: Apartment                                      Prior Living Arrangements/Services Living arrangements for the past 2 months: Apartment Lives with:: Self Patient language and need for interpreter reviewed:: Yes Do you feel safe going back to the place where you live?: Yes      Need for Family Participation in Patient Care: Yes (Comment) Care giver support system in place?: Yes (comment)   Criminal Activity/Legal Involvement Pertinent to Current Situation/Hospitalization: No - Comment as  needed  Activities of Daily Living Home Assistive Devices/Equipment: Walker (specify type), Cane (specify quad or straight) ADL Screening (condition at time of admission) Patient's cognitive ability adequate to safely complete daily activities?: Yes Is the patient deaf or have difficulty hearing?: No Does the patient have difficulty seeing, even when wearing glasses/contacts?: No Does the patient have difficulty concentrating, remembering, or making decisions?: No Patient able to express need for assistance with ADLs?: Yes Does the patient have difficulty dressing or bathing?: No Independently performs ADLs?: Yes (appropriate for developmental age) Does the patient have difficulty walking or climbing stairs?: Yes Weakness of Legs: Both Weakness of Arms/Hands: None  Permission Sought/Granted Permission sought to share information with : Case Manager, Customer service manager Permission granted to share information with : Yes, Verbal Permission Granted  Share Information with NAME: Harrell Gave  Permission granted to share info w AGENCY: Home Health, DME  Permission granted to share info w Relationship: daughter  Permission granted to share info w Contact Information: [phone]  Emotional Assessment Appearance:: Appears stated age Attitude/Demeanor/Rapport: Engaged, Other (comment) (anxious) Affect (typically observed): Accepting Orientation: : Oriented to Self, Oriented to Place, Oriented to  Time, Oriented to Situation   Psych Involvement: No (comment)  Admission diagnosis:  NSTEMI (non-ST elevated myocardial infarction) ([place]) [I21.4] Acute renal failure (ARF) (HCC) [N17.9] Acute CHF (congestive heart failure) ([place]) [I50.9]  AKI (acute kidney injury) ([place]) [N17.9] Acute respiratory failure with hypoxia and hypercapnia ([place]) [J96.01, J96.02] Acute renal failure, unspecified acute renal failure type ([place]) [N17.9] Acute on chronic congestive heart failure, unspecified  heart failure type ([place]) [I50.9] Patient Active Problem List   Diagnosis Date Noted   UTI (urinary tract infection) 01/09/2022   Acute CVA (cerebrovascular accident) ([place]) 01/06/2022   Slurred speech 12/31/2021   Thrombocytopenia ([place]) 12/31/2021   Leukocytosis 12/31/2021   Acute renal failure (ARF) ([place]) 12/31/2021   LRTI (lower respiratory tract infection) 08/29/2021   Age-related osteoporosis without current pathological fracture 08/28/2021   Cough productive of purulent sputum 08/28/2021   CKD (chronic kidney disease) stage 3, GFR 30-59 ml/min (HCC)    Visit for screening mammogram 05/22/2021   Steroid dependence ([place]) 05/22/2021   Estrogen deficiency 05/22/2021   Encounter for general adult medical examination with abnormal findings 05/22/2021   Dyslipidemia, goal LDL below 130 05/22/2021   Primary osteoarthritis of right knee 03/20/2020   Persistent atrial fibrillation ([place]) 10/13/2019   Allergic rhinitis 09/10/2019   Vitamin D deficiency 04/15/2019   Acute renal failure superimposed on stage 3b chronic kidney disease ([place]) 93/90/3009   Chronic systolic (congestive) heart failure ([place]) 03/05/2018   Chronic atrial fibrillation 03/05/2018   Urinary retention 11/23/2017   Bradycardia 11/20/2016   Acute on chronic combined systolic and diastolic CHF (congestive heart failure) ([place]) 11/20/2016   Cerebrovascular accident (CVA) due to embolism of left cerebellar artery (HCC)    PAF (paroxysmal atrial fibrillation) (HCC)    CHF (congestive heart failure) ([place]) 10/31/2016   CAD in native artery, s/p cardiac cath with non obstructive CAD 10/24/2016   NICM (nonischemic cardiomyopathy) ([place]) 10/24/2016   Tobacco abuse 10/20/2016   Degenerative arthritis of left knee 03/15/2012   Encounter for well adult exam with abnormal findings 03/12/2012   Hypersomnia    MENOPAUSAL DISORDER 01/09/2011   HLD (hyperlipidemia) 02/03/2008   Essential hypertension 07/22/2007   Raynaud's syndrome  07/22/2007   PCP:  Janith Lima, MD Pharmacy:   [id number] [address], [place] [address] Phone: [phone] Fax: [place] [address] [place] [address] Phone: [phone] Fax: [phone]     Social Determinants of Health (SDOH) Interventions Food Insecurity Interventions: Intervention Not Indicated Financial Strain Interventions: Intervention Not Indicated Housing Interventions: Intervention Not Indicated Transportation Interventions: Intervention Not Indicated  Readmission Risk Interventions No flowsheet data found.

## 2022-01-13 NOTE — Progress Notes (Signed)
PROGRESS NOTE    Patricia Wagner  KWI:[id number] DOB: 24-May-1951 DOA: 12/30/2021 PCP: Janith Lima, MD  Brief Narrative:70/F with history of chronic systolic CHF, NICM, EF 24%, A-fib, CKD 3B, history of neurogenic bladder with urinary retention and chronic Foley rheumatoid arthritis, Raynaud's disease, CVA, COPD admitted to the ICU with respiratory distress/fluid overload with AKI, NSTEMI-troponin peaked at 55, UTI,, did not require mechanical ventilation, subsequently transferred to [place] [facility] course was complicated by acute embolic stroke, started on anticoagulation -Heart failure team and nephrology following -Seen by palliative care as well, kidney function stabilizing, slowly improving with diuresis   Subjective: -Feels okay overall, requires 1-2+ assistance for mobility, declines rehab  Assessment and Plan:  * Acute on chronic combined systolic and diastolic CHF (congestive heart failure) ([place])- (present on admission) Mild to moderate mitral regurgitation Acute hypoxic hypercapnic respiratory failure Treated with BiPAP on admission  -echocardiogram 12/31/2021 has demonstrated EF 45-50%, severe RV enlargement with dysfunction  -Complicated by acute kidney injury on CKD 3b -Lasix restarted, dose increased to 120 mg twice daily, now switched to torsemide-80 Mg twice daily, weight improving -Also on midodrine 10 mg 3 times daily -Monitor urine output, BMP in a.m.   Acute renal failure (ARF) ([place])- (present on admission) AKI on CKD 3b Cardiorenal syndrome w/ ischemic ATN secondary to diuretics Lasix Entresto Jardiance etc. -Creatinine peaked at 5.4 -Renal ultrasound negative for hydronephrosis -Diuresed with IV Lasix and albumin, now on torsemide -Nephrology following, seen by palliative care as well, wishes for full code and full scope of treatment -See discussion above, kidney function stabilizing, HD catheter removed  Acute CVA (cerebrovascular accident) ([place]) On  admission the patient was found to have dysarthria. CT head was performed and was negative for acute findings - MRI brain noted scattered tine acute infarcts bilaterally consistent with embolic stroke, known history of A-fib, was not on anticoagulation due to history of diverticular bleeds, neurology consulting, now started on Eliquis, MRA negative for large vessel occlusion, carotid duplex noted minimal plaque in the extracranial vessels, LDL 47, A1c is 5.9 -PT OT eval completed, SNF recommended, declined SNF  CAD in native artery, s/p cardiac cath with non obstructive CAD- (present on admission) NSTEMI CAD in native artery, s/p cardiac cath with non obstructive CAD- (present on admission) - Troponin peaked at 5916 -Previous cardiac catheterization done in 2017 showing mild nonobstructive CAD -Treated with IV heparin initially, no chest pain, no plans for left heart cath in the setting of AKI  UTI (urinary tract infection) -Secondary to chronic Foley -Urine culture has grown out Klebsiella pneumoniae and Serratia Marcescens. Both are sensitive to Rocephin, completed course  PAF (paroxysmal atrial fibrillation) ([place])- (present on admission) Status post ablation in 2020 and amiodarone was stopped.  Not on Eliquis due to history of recurrent diverticular GI bleeding.  This has been restarted after further discussion regarding risk-benefit analysis  Thrombocytopenia ([place])- (present on admission) -Platelet count dropped to 55 K, now normal, likely related to critical illness   DVT prophylaxis: Apixaban Code Status: Full code Family Communication: Discussed patient in detail, no family at bedside, called and updated daughter [name] yesterday Disposition Plan: To be determined, declines rehab, requires two-person assistance but lives alone, high risk of quick readmission if she goes home  Consultants:  Nephrology, cardiology  Procedures:   Antimicrobials:    Objective: Vitals:    01/12/22 1951 01/13/22 0010 01/13/22 0411 01/13/22 1236  BP: (!) 112/56 117/84 133/62 120/66  Pulse: 89 75 75 88  Resp: 20 20 17 17   Temp: 98.9 F (37.2 C) 98.4 F (36.9 C) 97.6 F (36.4 C) 98.4 F (36.9 C)  TempSrc: Oral Oral Oral Oral  SpO2: 90% 95%  100%  Weight:  102.3 kg    Height:        Intake/Output Summary (Last 24 hours) at 01/13/2022 1316 Last data filed at 01/13/2022 1312 Gross per 24 hour  Intake 480 ml  Output 2425 ml  Net -1945 ml   Filed Weights   01/11/22 0346 01/12/22 0405 01/13/22 0010  Weight: 112.6 kg 110 kg 102.3 kg    Examination:  General exam: Obese pleasant female sitting up in bed, AAOx3, no distress HEENT: Positive JVD CVS: S1-S2, regular rate rhythm Lungs: Decreased breath sounds to bases, otherwise clear Abdomen: Soft, obese, nontender, bowel sounds present Extremities: Trace edema Psychiatry:  Mood & affect appropriate.   Data Reviewed:   CBC: Recent Labs  Lab 01/09/22 0427 01/10/22 0436 01/11/22 0431 01/12/22 0513 01/13/22 0338  WBC 10.7* 9.3 9.3 8.6 10.2  HGB 10.8* 9.8* 10.7* 10.1* 9.7*  HCT 34.4* 32.9* 35.2* 34.1* 32.7*  MCV 87.1 90.1 91.0 91.7 91.3  PLT 110* 165 192 187 893   Basic Metabolic Panel: Recent Labs  Lab 01/09/22 0427 01/10/22 0436 01/11/22 0431 01/11/22 0440 01/12/22 0513 01/13/22 0338  NA 139 142 143 142 142 141  K 4.1 3.3* 4.1 4.1 3.3* 3.2*  CL 99 101 99 98 96* 95*  CO2 25 31 32 32 35* 33*  GLUCOSE 89 98 91 90 92 90  BUN 118* 114* 115* 115* 111* 107*  CREATININE 4.03* 3.71* 4.01* 3.94* 3.57* 3.56*  CALCIUM 8.6* 8.4* 8.7* 8.6* 8.3* 8.5*  MG  --   --   --   --   --  1.5*  PHOS 4.8* 4.8*  --  4.6 4.0 3.2   GFR: Estimated Creatinine Clearance: 17.6 mL/min (A) (by C-G formula based on SCr of 3.56 mg/dL (H)). Liver Function Tests: Recent Labs  Lab 01/09/22 0427 01/10/22 0436 01/11/22 0440 01/12/22 0513 01/13/22 0338  ALBUMIN 2.8* 2.4* 2.7* 2.6* 2.6*   No results for input(s): LIPASE, AMYLASE  in the last 168 hours. No results for input(s): AMMONIA in the last 168 hours. Coagulation Profile: No results for input(s): INR, PROTIME in the last 168 hours. Cardiac Enzymes: No results for input(s): CKTOTAL, CKMB, CKMBINDEX, TROPONINI in the last 168 hours. BNP (last 3 results) No results for input(s): PROBNP in the last 8760 hours. HbA1C: No results for input(s): HGBA1C in the last 72 hours.  CBG: No results for input(s): GLUCAP in the last 168 hours.  Lipid Profile: No results for input(s): CHOL, HDL, LDLCALC, TRIG, CHOLHDL, LDLDIRECT in the last 72 hours.  Thyroid Function Tests: No results for input(s): TSH, T4TOTAL, FREET4, T3FREE, THYROIDAB in the last 72 hours. Anemia Panel: No results for input(s): VITAMINB12, FOLATE, FERRITIN, TIBC, IRON, RETICCTPCT in the last 72 hours. Urine analysis:    Component Value Date/Time   COLORURINE YELLOW 01/03/2022 1124   APPEARANCEUR CLOUDY (A) 01/03/2022 1124   LABSPEC 1.011 01/03/2022 1124   PHURINE 5.0 01/03/2022 1124   GLUCOSEU NEGATIVE 01/03/2022 1124   GLUCOSEU NEGATIVE 05/22/2021 1435   HGBUR SMALL (A) 01/03/2022 1124   BILIRUBINUR NEGATIVE 01/03/2022 1124   KETONESUR NEGATIVE 01/03/2022 1124   PROTEINUR 30 (A) 01/03/2022 1124   UROBILINOGEN 0.2 05/22/2021 1435   NITRITE NEGATIVE 01/03/2022 1124   LEUKOCYTESUR LARGE (A) 01/03/2022 1124   Sepsis Labs: @LABRCNTIP (procalcitonin:4,lacticidven:4)  )  No results found for this or any previous visit (from the past 240 hour(s)).    Radiology Studies: No results found.   Scheduled Meds:  apixaban  5 mg Oral BID   chlorhexidine  15 mL Mouth Rinse BID   Chlorhexidine Gluconate Cloth  6 each Topical Daily   Gerhardt's butt cream   Topical Daily   mouth rinse  15 mL Mouth Rinse q12n4p   mexiletine  300 mg Oral Q12H   midodrine  10 mg Oral TID WC   pantoprazole  40 mg Oral Daily   potassium chloride  40 mEq Oral BID   sodium chloride flush  10-40 mL Intracatheter Q12H    torsemide  80 mg Oral BID   Continuous Infusions:  sodium chloride Stopped (01/04/22 2119)   sodium chloride     magnesium sulfate bolus IVPB       LOS: 13 days    Time spent: 24min  Domenic Polite, MD [facility]   01/13/2022, 1:16 PM

## 2022-01-13 NOTE — Progress Notes (Addendum)
Admit: 12/30/2021 LOS: 13  Subjective:  Ms. Fraleigh was seen and evaluated at bedside this AM. She states that she is doing well, she is currently making urine. She denies any fatigue, light headedness, difficulties with concentration, nausea, or vomiting.   02/19 0701 - 02/20 0700 In: 360 [P.O.:360] Out: 1425 [Urine:1425]  Filed Weights   01/11/22 0346 01/12/22 0405 01/13/22 0010  Weight: 112.6 kg 110 kg 102.3 kg    Scheduled Meds:  apixaban  5 mg Oral BID   chlorhexidine  15 mL Mouth Rinse BID   Chlorhexidine Gluconate Cloth  6 each Topical Daily   Gerhardt's butt cream   Topical Daily   mouth rinse  15 mL Mouth Rinse q12n4p   mexiletine  200 mg Oral Q12H   midodrine  10 mg Oral TID WC   pantoprazole  40 mg Oral Daily   potassium chloride  40 mEq Oral BID   sodium chloride flush  10-40 mL Intracatheter Q12H   torsemide  80 mg Oral BID   Continuous Infusions:  sodium chloride Stopped (01/04/22 2119)   sodium chloride     PRN Meds:.sodium chloride, acetaminophen **OR** acetaminophen, heparin, loperamide, sodium chloride flush  Current Labs: reviewed   Physical Exam:  Blood pressure 133/62, pulse 75, temperature 97.6 F (36.4 C), temperature source Oral, resp. rate 17, height 5\' 4"  (1.626 m), weight 102.3 kg, SpO2 95 %. Physical Exam Constitutional:      General: She is not in acute distress.    Appearance: Normal appearance. She is not ill-appearing or toxic-appearing.     Comments: Resting comfortably in bed, NAD  Cardiovascular:     Rate and Rhythm: Normal rate and regular rhythm.     Pulses: Normal pulses.     Heart sounds: Normal heart sounds. No murmur heard.   No friction rub. No gallop.  Pulmonary:     Effort: Pulmonary effort is normal.     Breath sounds: No wheezing, rhonchi or rales.     Comments: On 2L supplemental O2, decreased breath sounds at the bases bilaterally Abdominal:     General: Abdomen is flat. Bowel sounds are normal.     Palpations: Abdomen  is soft.  Musculoskeletal:     Comments: Trace pitting edema bilaterally in the lower extremities   Neurological:     Mental Status: She is alert.  Psychiatric:        Behavior: Behavior normal.     Assessment:  Pt is a 71 y.o. yo female  with a PMH chronic systolic CHF, A-fib not on Eliquis due to history of diverticular GI bleeding, PVCs, CKD stage IIIb, chronic systolic CHF due to nonischemic cardiomyopathy (EF 35 to 40%), rheumatoid arthritis, Raynaud's disease, CVA, COPD, hypertension, hyperlipidemia.  # AKI on CKD stg IIIb:  Possible ATN but also a component of cardiorenal syndrome. On initial evaluations, kidney U/S w/out hydronephrosis, ANA, anti-GBM ab, ANCA, complement levels were unremarkable. BL sCr of 1.3-1.6.  In addition, she has expressed in the past that she would not want dialysis in the past, but is considering it as a last resort. Her sCr is stable today at 3.56 from 3.57. BUN of 107 today. Dialysis catheter was removed per her request. Torsemide started 2/19, net negative 1L 2/19. 700 mL in the foley today.  May consider decreasing to Torsemide daily pending today's I/Os. Does not need dialysis at this time. - Continue Torsemide 80 mg BID - Renally dose medications - Trend BMET - Contine to assess for  dialysis daily.  - Medication Issues; Preferred narcotic agents for pain control are hydromorphone, fentanyl, and methadone. Morphine should not be used.  Baclofen should be avoided Avoid oral sodium phosphate and magnesium citrate based laxatives / bowel preps   # Acute Hypoxic and Hypercapnic Respiratory Failure:  Currently on 2L of supplemental O2  - Continue diuretics  # Chronic Combined Systolic and Diastolic CHF:  Holding home entresto and Jardiance due to AKI. Continuing diuretics. Cardiology following.   # UTI: Urine culture grew Klebsiella and Serratia, treated with Ceftriaxone.   # Anemia of CKD:  Hgb of 9.7 from 10.1. Stable.   # Hypotension:  Bps  have been stable systolics 794-801.   # Acute Stroke:  Seen and evaluated by Neurology  [name], MD IMTS, PGY-3 01/13/2022,12:39 PM   Recent Labs  Lab 01/11/22 0440 01/12/22 0513 01/13/22 0338  NA 142 142 141  K 4.1 3.3* 3.2*  CL 98 96* 95*  CO2 32 35* 33*  GLUCOSE 90 92 90  BUN 115* 111* 107*  CREATININE 3.94* 3.57* 3.56*  CALCIUM 8.6* 8.3* 8.5*  PHOS 4.6 4.0 3.2   Recent Labs  Lab 01/11/22 0431 01/12/22 0513 01/13/22 0338  WBC 9.3 8.6 10.2  HGB 10.7* 10.1* 9.7*  HCT 35.2* 34.1* 32.7*  MCV 91.0 91.7 91.3  PLT 192 187 205    Plan communicated to the primary team   I have seen and examined this patient and agree with plan and assessment in the above note with renal recommendations/intervention highlighted. Scr improving and diuresing well.  Her main complaint is ongoing diarrhea and may benefit from GI evaluation. Governor Rooks Daana Petrasek,MD 01/13/2022 12:48 PM

## 2022-01-13 NOTE — Progress Notes (Signed)
Physical Therapy Treatment Patient Details Name: ALAISHA Wagner MRN: [id number] DOB: March 18, 1951 Today's Date: 01/13/2022   History of Present Illness Pt is a 71 y.o. female admitted 12/30/21 with SOB, AMS, slurred speech. Initially admitted as code stroke; head CT 2/6 negative for acute finding; neurology suspects slurred speech related to hypoxia given abscence of other focal neurodeficits. Workup for metabolic encephalopathy, AKI on CKD, severe metabolic/respiratory acidosis requiring bicarbonate infusion and BiPAP, probable UTI. MRI completed 2/12 due to continued slurred speech. MRI revealed scattered punctate acute bilateral cerebral and right cerebellar infarcts suggesting a central embolic source. PMH includes CKD, CHF, RA, COPD, CVA, CAD, HTN, HLD, Raynaud's disease.    PT Comments    Pt received in supine, eager to get OOB and mobilize with good effort during session. Pt anxious and self-directed, initially requesting no physical assist for transfers but then asking for assist once she was unable to stand on her own. Pt needing up to modA to stand from elevated bed height to RW and minA to stand from BSC/recliner with arm rests to RW. Pt noted to have bowel incontinence and needed assist for posterior peri-care after toileting. Pt performed gait trial ~105ft with chair follow for safety and 2L O2 [place], desat briefly but improves to [facility] with cues for pursed lip breathing. Pt needed seated break at doorway prior to return to room to eat dinner, pt not yet able to perform longer household distance gait tasks and continues to need heavy lift assist from standard bed height<>RW, encouraged her to get OOB to chair frequently throughout the day with nursing assist and to pivot to [facility] more with staff assist to strengthen her legs, pt agreeable but will need reinforcement. Pt continues to benefit from PT services to progress toward functional mobility goals. Continue to recommend SNF due to high fall risk, pt  unable to stand unassisted and unable to perform her own ADLs, will need consistent physical assist from 1-2 people for all standing/OOB mobility at home if she decides not to go to short term low intensity post-acute rehab.   Recommendations for follow up therapy are one component of a multi-disciplinary discharge planning process, led by the attending physician.  Recommendations may be updated based on patient status, additional functional criteria and insurance authorization.  Follow Up Recommendations  Skilled nursing-short term rehab (<3 hours/day) (Pt has been refusing, needs max HH services if she goes home)     Assistance Recommended at Discharge Frequent or constant Supervision/Assistance  Patient can return home with the following Two people to help with walking and/or transfers;Two people to help with bathing/dressing/bathroom;Help with stairs or ramp for entrance;Assist for transportation;Assistance with Education officer, environmental (measurements PT);Wheelchair cushion (measurements PT)    Recommendations for Other Services       Precautions / Restrictions Precautions Precautions: Fall;Other (comment) Precaution Comments: central line, chronic foley catheter Restrictions Weight Bearing Restrictions: No     Mobility  Bed Mobility Overal bed mobility: Needs Assistance Bed Mobility: Supine to Sit     Supine to sit: Min assist, HOB elevated     General bed mobility comments: advancing LEs well to EOB. use of bedrail and light handheld assist to pull trunk forward. assist to scoot hips with bed pad, pt c/o discomfort in peri-area due to towel placed there, which was soiled.    Transfers Overall transfer level: Needs assistance Equipment used: Rolling walker (2 wheels) Transfers: Sit to/from Stand, Bed to chair/wheelchair/BSC Sit to Stand: Mod  assist, From elevated surface   Step pivot transfers: Min guard, From elevated surface        General transfer comment: x3 attempts to stand on her own with Supervision per pt request but she was unable to stand unassisted (PTA helping to stabilize RW initially for safety as pt with poor technique but not interested in feedback on safer technique). Pt then requesting therapist assist and needing modA for lift assist/steadying with transition from EOB>RW from elevated bed. Min guard standing to pivot to BSC from EOB    Ambulation/Gait Ambulation/Gait assistance: Min guard, +2 safety/equipment Gait Distance (Feet): 50 Feet (32ft, seated break, 68ft) Assistive device: Rolling walker (2 wheels) Gait Pattern/deviations: Step-through pattern, Decreased stride length, Trunk flexed, Wide base of support       General Gait Details: pt eager to progress gait distance today, agreeable to chair follow for safety after explanation as to purpose of this, NT present to assist with O2/IV lines      Balance Overall balance assessment: Needs assistance Sitting-balance support: No upper extremity supported, Feet supported Sitting balance-Leahy Scale: Fair     Standing balance support: Bilateral upper extremity supported, During functional activity, Reliant on assistive device for balance Standing balance-Leahy Scale: Poor Standing balance comment: pt needing totalA from RN for posterior peri-care, pt able to self-support for gait/transfers with RW but min guard for safety/steadying              Cognition Arousal/Alertness: Awake/alert Behavior During Therapy: Flat affect, Agitated Overall Cognitive Status: No family/caregiver present to determine baseline cognitive functioning       General Comments: Pt self-directed but also making contradictory statements and difficulty getting point across at times which can further frustrate her. Pt states "don't help me get up" then asks for therapist to "put a hand under my arm" for lift assist when she is unable to stand on her own.         Exercises Other Exercises Other Exercises: ankle pumps x10    General Comments General comments (skin integrity, edema, etc.): SpO2 WFL on 2L O2 [place] at rest, desat briefly to 89% with exertion but improves with cues for pursed-lip breathing to 90% and greater within 1 minute seated rest break. HR elevated up to 136 bpm with exertion, mostly 95-115 during gait trial however.      Pertinent Vitals/Pain Pain Assessment Pain Assessment: Faces Faces Pain Scale: Hurts a little bit Pain Location: bottom/where towel underneath her over peri area Pain Descriptors / Indicators: Discomfort, Sore Pain Intervention(s): Limited activity within patient's tolerance, Monitored during session, Repositioned     PT Goals (current goals can now be found in the care plan section) Acute Rehab PT Goals Patient Stated Goal: home PT Goal Formulation: With patient Time For Goal Achievement: 01/15/22 Progress towards PT goals: Progressing toward goals    Frequency    Min 3X/week      PT Plan Current plan remains appropriate       AM-PAC PT "6 Clicks" Mobility   Outcome Measure  Help needed turning from your back to your side while in a flat bed without using bedrails?: A Little Help needed moving from lying on your back to sitting on the side of a flat bed without using bedrails?: A Little Help needed moving to and from a bed to a chair (including a wheelchair)?: A Lot Help needed standing up from a chair using your arms (e.g., wheelchair or bedside chair)?: A Lot Help needed to walk in  hospital room?: A Lot Help needed climbing 3-5 steps with a railing? : Total 6 Click Score: 13    End of Session Equipment Utilized During Treatment: Gait belt;Oxygen Activity Tolerance: Patient tolerated treatment well Patient left: in chair;with call bell/phone within reach;Other (comment) (NT notified pt wants a bath, NT preparing to enter room to assist with this, pt finishing dinner in chair) Nurse  Communication: Mobility status;Other (comment) (pt requesting a bath) PT Visit Diagnosis: Other abnormalities of gait and mobility (R26.89);Muscle weakness (generalized) (M62.81)     Time: 9935-7017 PT Time Calculation (min) (ACUTE ONLY): 52 min  Charges:  $Gait Training: 23-37 mins $Therapeutic Activity: 8-22 mins                     Ashaki Frosch P., PTA Acute Rehabilitation Services Pager: [phone] Office: [place] 01/13/2022, 6:33 PM

## 2022-01-14 DIAGNOSIS — I5043 Acute on chronic combined systolic (congestive) and diastolic (congestive) heart failure: Secondary | ICD-10-CM | POA: Diagnosis not present

## 2022-01-14 DIAGNOSIS — Z87898 Personal history of other specified conditions: Secondary | ICD-10-CM | POA: Insufficient documentation

## 2022-01-14 LAB — BASIC METABOLIC PANEL
Anion gap: 12 (ref 5–15)
BUN: 100 mg/dL — ABNORMAL HIGH (ref 8–23)
CO2: 35 mmol/L — ABNORMAL HIGH (ref 22–32)
Calcium: 8.5 mg/dL — ABNORMAL LOW (ref 8.9–10.3)
Chloride: 96 mmol/L — ABNORMAL LOW (ref 98–111)
Creatinine, Ser: 3.22 mg/dL — ABNORMAL HIGH (ref 0.44–1.00)
GFR, Estimated: 15 mL/min — ABNORMAL LOW (ref >60)
Glucose, Bld: 87 mg/dL (ref 70–99)
Potassium: 3.6 mmol/L (ref 3.5–5.1)
Sodium: 143 mmol/L (ref 135–145)

## 2022-01-14 LAB — CBC
HCT: 32.7 % — ABNORMAL LOW (ref 36.0–46.0)
Hemoglobin: 9.9 g/dL — ABNORMAL LOW (ref 12.0–15.0)
MCH: 27.5 pg (ref 26.0–34.0)
MCHC: 30.3 g/dL (ref 30.0–36.0)
MCV: 90.8 fL (ref 80.0–100.0)
Platelets: 215 10*3/uL (ref 150–400)
RBC: 3.6 MIL/uL — ABNORMAL LOW (ref 3.87–5.11)
RDW: 15.6 % — ABNORMAL HIGH (ref 11.5–15.5)
WBC: 8.3 10*3/uL (ref 4.0–10.5)
nRBC: 0 % (ref 0.0–0.2)

## 2022-01-14 LAB — MAGNESIUM: Magnesium: 1.8 mg/dL (ref 1.7–2.4)

## 2022-01-14 MED ORDER — MAGNESIUM SULFATE 2 GM/50ML IV SOLN
2.0000 g | Freq: Once | INTRAVENOUS | Status: AC
  Administered 2022-01-14: 2 g via INTRAVENOUS
  Filled 2022-01-14: qty 50

## 2022-01-14 MED ORDER — POTASSIUM CHLORIDE CRYS ER 20 MEQ PO TBCR
20.0000 meq | EXTENDED_RELEASE_TABLET | Freq: Once | ORAL | Status: AC
  Administered 2022-01-14: 20 meq via ORAL
  Filled 2022-01-14: qty 1

## 2022-01-14 MED ORDER — LEVALBUTEROL HCL 0.63 MG/3ML IN NEBU
0.6300 mg | INHALATION_SOLUTION | Freq: Four times a day (QID) | RESPIRATORY_TRACT | Status: DC | PRN
  Administered 2022-01-14: 0.63 mg via RESPIRATORY_TRACT
  Filled 2022-01-14: qty 3

## 2022-01-14 NOTE — Progress Notes (Signed)
Order placed for catheter to be exchanged. 20 Fr foley catheter balloon deflated- 72mL returned. New 20Fr foley catheter inserted using sterile technique. Catheter advance to hub- immediate return of clear yellow urine. Patient tolerated insertion well. Stat lock secured to right thigh.

## 2022-01-14 NOTE — Assessment & Plan Note (Signed)
Chronic Foley, last changed in the end of January, will change this today

## 2022-01-14 NOTE — Progress Notes (Signed)
Mobility Specialist Progress Note:   01/14/22 1250  Mobility  Activity Ambulated with assistance in hallway  Level of Assistance +2 (takes two people)  Assistive Device Front wheel walker  Distance Ambulated (ft) 100 ft  Activity Response Tolerated well  $Mobility charge 1 Mobility    Pt with stool incontinence upon standing with heavy modA +1. Ambulated with CGA for safety and chair follow to BR then a good hallway distance. Pt left sitting up in chair with all needs met.   Nelta Numbers Acute Rehab Phone: [phone] Office Phone: [phone]

## 2022-01-14 NOTE — Progress Notes (Signed)
PROGRESS NOTE    Patricia Wagner  UYQ:[id number] DOB: [birth year] DOA: 12/30/2021 PCP: Janith Lima, MD  Brief Narrative:70/F with history of chronic systolic CHF, NICM, EF 63%, A-fib, CKD 3B, history of neurogenic bladder with urinary retention and chronic Foley rheumatoid arthritis, Raynaud's disease, CVA, COPD admitted to the ICU with respiratory distress/fluid overload with AKI, NSTEMI-troponin peaked at 20, UTI,, did not require mechanical ventilation, subsequently transferred to [facility] course was complicated by acute embolic stroke, started on anticoagulation -Heart failure team and nephrology following -Seen by palliative care as well, kidney function stabilizing, slowly improving with diuresis -Requires 1-2+ assistance to stand or ambulate, continues to decline rehab   Subjective: -Feels okay overall, breathing is improving, denies any complaints today  Assessment and Plan:  * Acute on chronic combined systolic and diastolic CHF (congestive heart failure) ([place])- (present on admission) Mild to moderate mitral regurgitation Acute hypoxic hypercapnic respiratory failure Treated with BiPAP on admission  -echocardiogram 12/31/2021 has demonstrated EF 45-50%, severe RV enlargement with dysfunction  -Complicated by acute kidney injury on CKD 3b -Lasix restarted, dose increased to 120 mg twice daily, now switched to torsemide-80 Mg twice daily, volume status continuing to improve, weight down -Diuretics per nephrology -Continue to increase activity daily as tolerated, she adamantly declines rehab   Acute renal failure (ARF) ([place])- (present on admission) AKI on CKD 3b Cardiorenal syndrome w/ ischemic ATN secondary to diuretics Lasix Entresto Jardiance etc. -Creatinine peaked at 5.4 -Renal ultrasound negative for hydronephrosis -Diuresed with IV Lasix and albumin, now on torsemide -Nephrology following, seen by palliative care as well, wishes for full code and full  scope of treatment -See discussion above, kidney function stabilizing, HD catheter removed  Acute CVA (cerebrovascular accident) ([place]) On admission the patient was found to have dysarthria. - MRI brain noted scattered tine acute infarcts bilaterally consistent with embolic stroke, known history of A-fib, was not on anticoagulation due to history of diverticular bleeds, neurology consulted, started on Eliquis, carotid duplex noted minimal plaque in the extracranial vessels, LDL 47, A1c is 5.9 -PT OT eval completed, SNF recommended, declines SNF  CAD in native artery, s/p cardiac cath with non obstructive CAD- (present on admission) NSTEMI CAD in native artery, s/p cardiac cath with non obstructive CAD- (present on admission) - Troponin peaked at 5916 -Previous cardiac catheterization done in 2017 showing mild nonobstructive CAD -Treated with IV heparin initially, no chest pain, no plans for left heart cath in the setting of AKI  UTI (urinary tract infection) -Secondary to chronic Foley -Urine culture has grown out Klebsiella pneumoniae and Serratia Marcescens. Both are sensitive to Rocephin, completed course -Will change Foley catheter today  PAF (paroxysmal atrial fibrillation) ([place])- (present on admission) Status post ablation in 2020 and amiodarone was stopped.  Not on Eliquis due to history of recurrent diverticular GI bleeding.  This has been restarted after further discussion regarding risk-benefit analysis  H/O urinary retention Chronic Foley, last changed in the end of January, will change this today  Thrombocytopenia ([place])- (present on admission) -Platelet count dropped to 55 K, now normal, likely related to critical illness   DVT prophylaxis: Apixaban Code Status: Full code Family Communication:  no family at bedside, called and updated daughter [name] 2/19 Disposition Plan: To be determined, declines rehab, requires two-person assistance but lives alone, high risk of quick  readmission if she goes home alone  Consultants:  Nephrology, cardiology  Procedures:   Antimicrobials:    Objective: Vitals:   01/14/22 0016  01/14/22 0322 01/14/22 0741 01/14/22 1103  BP:   (!) 121/58 107/63  Pulse: 78  75 72  Resp: 19  20 20   Temp:  97.7 F (36.5 C) 97.7 F (36.5 C) 97.9 F (36.6 C)  TempSrc:  Oral Oral Oral  SpO2: 95%  99% 98%  Weight:      Height:        Intake/Output Summary (Last 24 hours) at 01/14/2022 1421 Last data filed at 01/14/2022 1145 Gross per 24 hour  Intake 600 ml  Output 3425 ml  Net -2825 ml   Filed Weights   01/11/22 0346 01/12/22 0405 01/13/22 0010  Weight: 112.6 kg 110 kg 102.3 kg    Examination:  General exam: Obese pleasant female sitting up in bed, AAOx3, no distress HEENT: Positive JVD CVS: S1-S2, regular rate rhythm Lungs: Decreased breath sounds to bases, otherwise clear Abdomen: Soft, obese, nontender, bowel sounds present  Extremities: Trace edema Psychiatry:  Mood & affect appropriate.   Data Reviewed:   CBC: Recent Labs  Lab 01/10/22 0436 01/11/22 0431 01/12/22 0513 01/13/22 0338 01/14/22 0257  WBC 9.3 9.3 8.6 10.2 8.3  HGB 9.8* 10.7* 10.1* 9.7* 9.9*  HCT 32.9* 35.2* 34.1* 32.7* 32.7*  MCV 90.1 91.0 91.7 91.3 90.8  PLT 165 192 187 205 431   Basic Metabolic Panel: Recent Labs  Lab 01/09/22 0427 01/10/22 0436 01/11/22 0431 01/11/22 0440 01/12/22 0513 01/13/22 0338 01/14/22 0257  NA 139 142 143 142 142 141 143  K 4.1 3.3* 4.1 4.1 3.3* 3.2* 3.6  CL 99 101 99 98 96* 95* 96*  CO2 25 31 32 32 35* 33* 35*  GLUCOSE 89 98 91 90 92 90 87  BUN 118* 114* 115* 115* 111* 107* 100*  CREATININE 4.03* 3.71* 4.01* 3.94* 3.57* 3.56* 3.22*  CALCIUM 8.6* 8.4* 8.7* 8.6* 8.3* 8.5* 8.5*  MG  --   --   --   --   --  1.5* 1.8  PHOS 4.8* 4.8*  --  4.6 4.0 3.2  --    GFR: Estimated Creatinine Clearance: 19.5 mL/min (A) (by C-G formula based on SCr of 3.22 mg/dL (H)). Liver Function Tests: Recent Labs  Lab  01/09/22 0427 01/10/22 0436 01/11/22 0440 01/12/22 0513 01/13/22 0338  ALBUMIN 2.8* 2.4* 2.7* 2.6* 2.6*   No results for input(s): LIPASE, AMYLASE in the last 168 hours. No results for input(s): AMMONIA in the last 168 hours. Coagulation Profile: No results for input(s): INR, PROTIME in the last 168 hours. Cardiac Enzymes: No results for input(s): CKTOTAL, CKMB, CKMBINDEX, TROPONINI in the last 168 hours. BNP (last 3 results) No results for input(s): PROBNP in the last 8760 hours. HbA1C: No results for input(s): HGBA1C in the last 72 hours.  CBG: No results for input(s): GLUCAP in the last 168 hours.  Lipid Profile: No results for input(s): CHOL, HDL, LDLCALC, TRIG, CHOLHDL, LDLDIRECT in the last 72 hours.  Thyroid Function Tests: No results for input(s): TSH, T4TOTAL, FREET4, T3FREE, THYROIDAB in the last 72 hours. Anemia Panel: No results for input(s): VITAMINB12, FOLATE, FERRITIN, TIBC, IRON, RETICCTPCT in the last 72 hours. Urine analysis:    Component Value Date/Time   COLORURINE YELLOW 01/03/2022 1124   APPEARANCEUR CLOUDY (A) 01/03/2022 1124   LABSPEC 1.011 01/03/2022 1124   PHURINE 5.0 01/03/2022 1124   GLUCOSEU NEGATIVE 01/03/2022 1124   GLUCOSEU NEGATIVE 05/22/2021 1435   HGBUR SMALL (A) 01/03/2022 1124   BILIRUBINUR NEGATIVE 01/03/2022 1124   [place] 01/03/2022 1124  PROTEINUR 30 (A) 01/03/2022 1124   UROBILINOGEN 0.2 05/22/2021 1435   NITRITE NEGATIVE 01/03/2022 1124   LEUKOCYTESUR LARGE (A) 01/03/2022 1124   Sepsis Labs: @LABRCNTIP (procalcitonin:4,lacticidven:4)  ) No results found for this or any previous visit (from the past 240 hour(s)).    Radiology Studies: No results found.   Scheduled Meds:  apixaban  5 mg Oral BID   chlorhexidine  15 mL Mouth Rinse BID   Chlorhexidine Gluconate Cloth  6 each Topical Daily   Gerhardt's butt cream   Topical Daily   mouth rinse  15 mL Mouth Rinse q12n4p   mexiletine  300 mg Oral Q12H    midodrine  10 mg Oral TID WC   pantoprazole  40 mg Oral Daily   sodium chloride flush  10-40 mL Intracatheter Q12H   torsemide  80 mg Oral BID   Continuous Infusions:  sodium chloride Stopped (01/04/22 2119)   sodium chloride       LOS: 14 days    Time spent: 23min  Domenic Polite, MD [facility]   01/14/2022, 2:21 PM

## 2022-01-14 NOTE — Progress Notes (Addendum)
Admit: 12/30/2021 LOS: 14  Subjective:  Patricia Wagner was seen and evaluated at bedside this AM. She states that she is doing well. She feels like her bowel movements are slowly improving. She   02/20 0701 - 02/21 0700 In: 360 [P.O.:360] Out: 3050 [Urine:3050]  Filed Weights   01/11/22 0346 01/12/22 0405 01/13/22 0010  Weight: 112.6 kg 110 kg 102.3 kg    Scheduled Meds:  apixaban  5 mg Oral BID   chlorhexidine  15 mL Mouth Rinse BID   Chlorhexidine Gluconate Cloth  6 each Topical Daily   Gerhardt's butt cream   Topical Daily   mouth rinse  15 mL Mouth Rinse q12n4p   mexiletine  300 mg Oral Q12H   midodrine  10 mg Oral TID WC   pantoprazole  40 mg Oral Daily   sodium chloride flush  10-40 mL Intracatheter Q12H   torsemide  80 mg Oral BID   Continuous Infusions:  sodium chloride Stopped (01/04/22 2119)   sodium chloride     PRN Meds:.sodium chloride, acetaminophen **OR** acetaminophen, heparin, loperamide, sodium chloride flush  Current Labs: reviewed   Physical Exam:  Blood pressure (!) 121/58, pulse 75, temperature 97.7 F (36.5 C), temperature source Oral, resp. rate 20, height 5\' 4"  (1.626 m), weight 102.3 kg, SpO2 99 %. Physical Exam Constitutional:      General: She is not in acute distress.    Appearance: She is obese. She is not ill-appearing or toxic-appearing.  Cardiovascular:     Rate and Rhythm: Normal rate and regular rhythm.     Pulses: Normal pulses.     Heart sounds: Normal heart sounds. No murmur heard.   No friction rub. No gallop.  Pulmonary:     Effort: Pulmonary effort is normal. No respiratory distress.     Breath sounds: No wheezing, rhonchi or rales.  Abdominal:     General: Abdomen is flat. Bowel sounds are normal.     Palpations: Abdomen is soft.     Tenderness: There is no abdominal tenderness. There is no guarding.  Musculoskeletal:     Comments: Trace pitting edema in the lower extremities bilaterally  Neurological:     Mental Status: She  is alert and oriented to person, place, and time.  Psychiatric:        Mood and Affect: Mood normal.        Behavior: Behavior normal.     Assessment:  Pt is a 71 y.o. yo female  with a PMH chronic systolic CHF, A-fib not on Eliquis due to history of diverticular GI bleeding, PVCs, CKD stage IIIb, chronic systolic CHF due to nonischemic cardiomyopathy (EF 35 to 40%), rheumatoid arthritis, Raynaud's disease, CVA, COPD, hypertension, hyperlipidemia.  # AKI on CKD stg IIIb:  Possible ATN but also a component of cardiorenal syndrome. On initial evaluations, kidney U/S w/out hydronephrosis, ANA, anti-GBM ab, ANCA, complement levels were unremarkable. BL sCr of 1.3-1.6.  In addition, she has expressed in the past that she would not want dialysis in the past, but is considering it as a last resort. Her sCr is mildly improved improved today to 3.22 with 2.6L net negative yesterday.  - Continue Torsemide 80 mg BID, pending I/Os, may decrease frequency on 2/22 - Renally dose medications - Trend BMET - Contine to assess for dialysis daily.  - Medication Issues; Preferred narcotic agents for pain control are hydromorphone, fentanyl, and methadone. Morphine should not be used.  Baclofen should be avoided Avoid oral sodium phosphate and  magnesium citrate based laxatives / bowel preps   # Acute Hypoxic and Hypercapnic Respiratory Failure:  Currently on 2L of supplemental O2  - Continue diuretics  # Chronic Combined Systolic and Diastolic CHF:  Holding home entresto and Jardiance due to AKI. Continuing diuretics. Cardiology following.   # UTI: Urine culture grew Klebsiella and Serratia, treated with Ceftriaxone.   # Anemia of CKD:  Hgb of 9.7 from 10.1. Stable.   # Hypotension:  Bps have been stable systolics 536-644.   # Acute Stroke:  Seen and evaluated by Neurology  # Diarrhea Patient had additional 5 stool occurrences yesterday. This concern has been ongoing since her admission. She may  benefit from a  GI consult.   Patricia Mercury, MD IMTS, PGY-3 01/14/2022,7:44 AM   Recent Labs  Lab 01/11/22 0440 01/12/22 0513 01/13/22 0338 01/14/22 0257  NA 142 142 141 143  K 4.1 3.3* 3.2* 3.6  CL 98 96* 95* 96*  CO2 32 35* 33* 35*  GLUCOSE 90 92 90 87  BUN 115* 111* 107* 100*  CREATININE 3.94* 3.57* 3.56* 3.22*  CALCIUM 8.6* 8.3* 8.5* 8.5*  PHOS 4.6 4.0 3.2  --     Recent Labs  Lab 01/12/22 0513 01/13/22 0338 01/14/22 0257  WBC 8.6 10.2 8.3  HGB 10.1* 9.7* 9.9*  HCT 34.1* 32.7* 32.7*  MCV 91.7 91.3 90.8  PLT 187 205 215     Plan communicated to the primary team   I have seen and examined this patient and agree with plan and assessment in the above note with renal recommendations/intervention highlighted.  Continues to improve daily with regard to renal function and diuresis.  No indication for dialysis and will continue to follow.  May need to change torsemide to daily if she continues to have significant diuresis. Patricia John A Rohnan Bartleson,MD 01/14/2022 1:02 PM

## 2022-01-15 DIAGNOSIS — N390 Urinary tract infection, site not specified: Secondary | ICD-10-CM

## 2022-01-15 DIAGNOSIS — T83511S Infection and inflammatory reaction due to indwelling urethral catheter, sequela: Secondary | ICD-10-CM

## 2022-01-15 DIAGNOSIS — N170 Acute kidney failure with tubular necrosis: Secondary | ICD-10-CM | POA: Diagnosis not present

## 2022-01-15 DIAGNOSIS — I5043 Acute on chronic combined systolic (congestive) and diastolic (congestive) heart failure: Secondary | ICD-10-CM | POA: Diagnosis not present

## 2022-01-15 DIAGNOSIS — I639 Cerebral infarction, unspecified: Secondary | ICD-10-CM | POA: Diagnosis not present

## 2022-01-15 DIAGNOSIS — I480 Paroxysmal atrial fibrillation: Secondary | ICD-10-CM | POA: Diagnosis not present

## 2022-01-15 LAB — CBC
HCT: 31.2 % — ABNORMAL LOW (ref 36.0–46.0)
Hemoglobin: 9.3 g/dL — ABNORMAL LOW (ref 12.0–15.0)
MCH: 26.7 pg (ref 26.0–34.0)
MCHC: 29.8 g/dL — ABNORMAL LOW (ref 30.0–36.0)
MCV: 89.7 fL (ref 80.0–100.0)
Platelets: 199 10*3/uL (ref 150–400)
RBC: 3.48 MIL/uL — ABNORMAL LOW (ref 3.87–5.11)
RDW: 15.5 % (ref 11.5–15.5)
WBC: 5.6 10*3/uL (ref 4.0–10.5)
nRBC: 0 % (ref 0.0–0.2)

## 2022-01-15 LAB — BASIC METABOLIC PANEL
Anion gap: 12 (ref 5–15)
BUN: 95 mg/dL — ABNORMAL HIGH (ref 8–23)
CO2: 37 mmol/L — ABNORMAL HIGH (ref 22–32)
Calcium: 8.5 mg/dL — ABNORMAL LOW (ref 8.9–10.3)
Chloride: 91 mmol/L — ABNORMAL LOW (ref 98–111)
Creatinine, Ser: 3.05 mg/dL — ABNORMAL HIGH (ref 0.44–1.00)
GFR, Estimated: 16 mL/min — ABNORMAL LOW (ref >60)
Glucose, Bld: 92 mg/dL (ref 70–99)
Potassium: 3.3 mmol/L — ABNORMAL LOW (ref 3.5–5.1)
Sodium: 140 mmol/L (ref 135–145)

## 2022-01-15 MED ORDER — POTASSIUM CHLORIDE CRYS ER 20 MEQ PO TBCR
40.0000 meq | EXTENDED_RELEASE_TABLET | Freq: Once | ORAL | Status: AC
  Administered 2022-01-15: 40 meq via ORAL
  Filled 2022-01-15: qty 2

## 2022-01-15 NOTE — Progress Notes (Signed)
Physical Therapy Treatment Patient Details Name: Patricia Wagner MRN: [id number] DOB: Aug 25, 1951 Today's Date: 01/15/2022   History of Present Illness Pt is a 71 y.o. female admitted 12/30/21 with SOB, AMS, slurred speech. Initially admitted as code stroke; head CT 2/6 negative for acute finding; neurology suspects slurred speech related to hypoxia given abscence of other focal neurodeficits. Workup for metabolic encephalopathy, AKI on CKD, severe metabolic/respiratory acidosis requiring bicarbonate infusion and BiPAP, probable UTI. MRI completed 2/12 due to continued slurred speech. MRI revealed scattered punctate acute bilateral cerebral and right cerebellar infarcts suggesting a central embolic source. PMH includes CKD, CHF, RA, COPD, CVA, CAD, HTN, HLD, Raynaud's disease.    PT Comments    Pt admitted with above diagnosis. Pt met 0/3 goals but was progressing toward the goals. Revised goals today. Pt making progress but at times is self limiting.  Pt vary particular about assistance given to her and will ask PT to "get out of the way."  Pt did need 2LO2 with activity to keep sats >90%.  She did progress distance today and is overall steady with RW. Will follow acutely.  Pt currently with functional limitations due to balance and endurance deficits. Pt will benefit from skilled PT to increase their independence and safety with mobility to allow discharge to the venue listed below.      Recommendations for follow up therapy are one component of a multi-disciplinary discharge planning process, led by the attending physician.  Recommendations may be updated based on patient status, additional functional criteria and insurance authorization.  Follow Up Recommendations  Skilled nursing-short term rehab (<3 hours/day) (Pt has been refusing, needs max HH services if she goes home)     Assistance Recommended at Discharge Frequent or constant Supervision/Assistance  Patient can return home with the following  Two people to help with walking and/or transfers;Two people to help with bathing/dressing/bathroom;Help with stairs or ramp for entrance;Assist for transportation;Assistance with Education officer, environmental (measurements PT);Wheelchair cushion (measurements PT)    Recommendations for Other Services       Precautions / Restrictions Precautions Precautions: Fall;Other (comment) Precaution Comments: central line, chronic foley catheter Restrictions Weight Bearing Restrictions: No     Mobility  Bed Mobility Overal bed mobility: Needs Assistance Bed Mobility: Supine to Sit     Supine to sit: Min assist, HOB elevated     General bed mobility comments: Assist to come to eOB but minimal with pt asking for help and then telling PT not to touch her.    Transfers Overall transfer level: Needs assistance Equipment used: Rolling walker (2 wheels) Transfers: Sit to/from Stand, Bed to chair/wheelchair/BSC Sit to Stand: Mod assist   Step pivot transfers: Min guard       General transfer comment: Pt asked PT to help her power up and needed mod assist to power up but once up pt needed min guard for transfer and told PT to quit touching her when PT providing guard assist. She had BM. PT assidted with cleaning.    Ambulation/Gait Ambulation/Gait assistance: Min guard Gait Distance (Feet): 150 Feet Assistive device: Rolling walker (2 wheels) Gait Pattern/deviations: Step-through pattern, Decreased stride length, Trunk flexed, Wide base of support   Gait velocity interpretation: <1.31 ft/sec, indicative of household ambulator   General Gait Details: pt eager to progress gait distance today however she kept telling PT to get ahead of her but then complaining PT was pulling on her one minute and the next saying PT was  making her go slow.  HHer gait is steady and safe overall and she needs cues at times to stay inside RW.   Stairs              Wheelchair Mobility    Modified Rankin (Stroke Patients Only)       Balance Overall balance assessment: Needs assistance Sitting-balance support: No upper extremity supported, Feet supported Sitting balance-Leahy Scale: Fair     Standing balance support: Bilateral upper extremity supported, During functional activity, Reliant on assistive device for balance Standing balance-Leahy Scale: Poor Standing balance comment: Needs min guard assist and use of RW for support in standing.                            Cognition Arousal/Alertness: Awake/alert Behavior During Therapy: Flat affect, Agitated Overall Cognitive Status: No family/caregiver present to determine baseline cognitive functioning                                 General Comments: Pt self-directed but also making contradictory statements and difficulty getting point across at times which can further frustrate her. Pt with condescending comments towards therapist throughout session, refuses to have therapist clsoe by with mobility despite education on fall risk.        Exercises General Exercises - Lower Extremity Ankle Circles/Pumps: AROM, Both, 5 reps, Supine Long Arc Quad: AROM, Both, 10 reps, Seated    General Comments General comments (skin integrity, edema, etc.): O2 in place at 2L with sats >90%. Other VSS      Pertinent Vitals/Pain Pain Assessment Pain Assessment: No/denies pain    Home Living                          Prior Function            PT Goals (current goals can now be found in the care plan section) Acute Rehab PT Goals Patient Stated Goal: home PT Goal Formulation: With patient Time For Goal Achievement: 01/29/22 Potential to Achieve Goals: Fair Progress towards PT goals: Progressing toward goals    Frequency    Min 3X/week      PT Plan Current plan remains appropriate    Co-evaluation              AM-PAC PT "6 Clicks" Mobility    Outcome Measure  Help needed turning from your back to your side while in a flat bed without using bedrails?: A Little Help needed moving from lying on your back to sitting on the side of a flat bed without using bedrails?: A Little Help needed moving to and from a bed to a chair (including a wheelchair)?: A Lot Help needed standing up from a chair using your arms (e.g., wheelchair or bedside chair)?: A Lot Help needed to walk in hospital room?: A Little Help needed climbing 3-5 steps with a railing? : Total 6 Click Score: 14    End of Session Equipment Utilized During Treatment: Gait belt;Oxygen Activity Tolerance: Patient tolerated treatment well Patient left: in chair;with call bell/phone within reach;with chair alarm set Nurse Communication: Mobility status PT Visit Diagnosis: Other abnormalities of gait and mobility (R26.89);Muscle weakness (generalized) (M62.81)     Time: 6546-5035 PT Time Calculation (min) (ACUTE ONLY): 32 min  Charges:  $Gait Training: 8-22 mins $Therapeutic Exercise: 8-22 mins  [facility] M,PT Acute Rehab Services [phone] (pager)    Alvira Philips 01/15/2022, 2:48 PM

## 2022-01-15 NOTE — Progress Notes (Addendum)
Admit: 12/30/2021 LOS: 15  Subjective:  Patricia Wagner was seen and evaluated at bedside this AM. She states that she is doing well today. She states that last night she was told that her oxygen levels were low, but she felt fine at the time. She has no complaints of headaches, nausea, or vomiting.   02/21 0701 - 02/22 0700 In: 600 [P.O.:600] Out: 2675 [Urine:2675]  Filed Weights   01/12/22 0405 01/13/22 0010 01/15/22 0400  Weight: 110 kg 102.3 kg 105 kg    Scheduled Meds:  apixaban  5 mg Oral BID   chlorhexidine  15 mL Mouth Rinse BID   Chlorhexidine Gluconate Cloth  6 each Topical Daily   Gerhardt's butt cream   Topical Daily   mouth rinse  15 mL Mouth Rinse q12n4p   mexiletine  300 mg Oral Q12H   midodrine  10 mg Oral TID WC   pantoprazole  40 mg Oral Daily   sodium chloride flush  10-40 mL Intracatheter Q12H   torsemide  80 mg Oral BID   Continuous Infusions:  sodium chloride Stopped (01/04/22 2119)   sodium chloride     PRN Meds:.sodium chloride, acetaminophen **OR** acetaminophen, heparin, levalbuterol, loperamide, sodium chloride flush  Current Labs: reviewed   Physical Exam:  Blood pressure 131/61, pulse 70, temperature 98.4 F (36.9 C), temperature source Oral, resp. rate 18, height 5\' 4"  (1.626 m), weight 105 kg, SpO2 94 %. Physical Exam Constitutional:      General: She is not in acute distress.    Appearance: Normal appearance. She is not ill-appearing or toxic-appearing.  Cardiovascular:     Rate and Rhythm: Normal rate and regular rhythm.     Pulses: Normal pulses.     Heart sounds: Normal heart sounds. No murmur heard.   No friction rub. No gallop.  Pulmonary:     Effort: Pulmonary effort is normal. No respiratory distress.     Breath sounds: No stridor.     Comments: Saturating at 95% on 2L [place], diminished breath sounds at the bases bilaterally, scattered wheezes appreciated in the mid-upper lobes.  Chest:     Chest wall: No tenderness.  Abdominal:      General: Abdomen is flat. Bowel sounds are normal.     Palpations: Abdomen is soft.     Tenderness: There is no abdominal tenderness.  Musculoskeletal:     Comments: Trace pitting edema bilaterally in the lower extremities  Neurological:     Mental Status: She is alert and oriented to person, place, and time.     Assessment:  Pt is a 71 y.o. yo female  with a PMH chronic systolic CHF, A-fib not on Eliquis due to history of diverticular GI bleeding, PVCs, CKD stage IIIb, chronic systolic CHF due to nonischemic cardiomyopathy (EF 35 to 40%), rheumatoid arthritis, Raynaud's disease, CVA, COPD, hypertension, hyperlipidemia.  # AKI on CKD stg IIIb:  Possible ATN but also a component of cardiorenal syndrome. On initial evaluations, kidney U/S w/out hydronephrosis, ANA, anti-GBM ab, ANCA, complement levels were unremarkable. BL sCr of 1.3-1.6.  In addition, she has expressed in the past that she would not want dialysis in the past, but is considering it as a last resort. Her sCr is improved today to 3.05 with 2.6L net negative yesterday. BUN continues to decrease, 95 today. No indication for dialysis. - Continue Torsemide 80 mg BID - Renally dose medications - Trend BMET - Contine to assess for dialysis daily.  - Medication Issues; Preferred narcotic  agents for pain control are hydromorphone, fentanyl, and methadone. Morphine should not be used.  Baclofen should be avoided Avoid oral sodium phosphate and magnesium citrate based laxatives / bowel preps   # Acute Hypoxic and Hypercapnic Respiratory Failure:  - Continue diuretics  # Chronic Combined Systolic and Diastolic CHF:  Holding home entresto and Jardiance due to AKI. Continuing diuretics. Cardiology following.   # UTI: Urine culture grew Klebsiella and Serratia, treated with Ceftriaxone.   # Anemia of CKD:  Hgb of 9.3, stable  # Hypotension:  - On Midodrine 10 mg TID  # Acute Stroke:  Seen and evaluated by Neurology  Patricia Mercury, MD IMTS, PGY-3 01/15/2022,7:45 AM   Recent Labs  Lab 01/11/22 0440 01/12/22 0513 01/13/22 0338 01/14/22 0257 01/15/22 0352  NA 142 142 141 143 140  K 4.1 3.3* 3.2* 3.6 3.3*  CL 98 96* 95* 96* 91*  CO2 32 35* 33* 35* 37*  GLUCOSE 90 92 90 87 92  BUN 115* 111* 107* 100* 95*  CREATININE 3.94* 3.57* 3.56* 3.22* 3.05*  CALCIUM 8.6* 8.3* 8.5* 8.5* 8.5*  PHOS 4.6 4.0 3.2  --   --     Recent Labs  Lab 01/13/22 0338 01/14/22 0257 01/15/22 0352  WBC 10.2 8.3 5.6  HGB 9.7* 9.9* 9.3*  HCT 32.7* 32.7* 31.2*  MCV 91.3 90.8 89.7  PLT 205 215 199    I have seen and examined this patient and agree with plan and assessment in the above note with renal recommendations/intervention highlighted. Will maintain diuretics for now as she is improving from volume standpoint as well as renal.  Patricia Rooks Braniyah Besse,MD 01/15/2022 1:06 PM

## 2022-01-15 NOTE — Significant Event (Signed)
At time of assessment, pt resting slouched in bed, HR 110s and RR 22-28. Pt denies feeling SOB, able to speak in full sentences. Placed pulse oximeter on pt and sats 67-72% RA. Pt still denies SOB and states "it's just the way I'm laying". RN assisted pt to reposition and sit up in bed but sats still 70s. Pt at first refusing to wear oxygen. RN educated pt on low sats and need for oxygen and pt finally agreeable after some discussion. Placed on 2Lnc and sats increased to 93% within a couple minutes, HR improved to 90s and RR improved 18-20. Lung sounds diminished throughout with expiratory wheezes.  Night MD and respiratory therapy notified of above. Nebulizer ordered and given by RT. Lung sounds slightly improved. Sats remain >90% on 2Lnc. RN emphasized importance of pt keeping pulse oximeter and oxygen in place. Pt stated understanding.

## 2022-01-15 NOTE — Progress Notes (Signed)
Progress Note   Patient: Patricia Wagner WIO:[id number] DOB: Mar 31, 1951 DOA: 12/30/2021     15 DOS: the patient was seen and examined on 01/15/2022   Brief hospital course: Mr. Ellett was admitted to the hospital with the working diagnosis of decompensated heart failure.   71 yo female with the past medical history of atrial fibrillation, CKD stage 3b, systolic heart failure, CVA, COPD and hypertension who presented with 5 days of diarrhea and pre syncope episode. Patient was diagnosed with hypoxemia, but declined to be transported to the ED. The following day patient developed slurred speech and right arm weakness. On her initial physical examination her 02 saturation was 70 to 80%, and she was placed on Bipap, BP 95/58, HR 79, RR 18. She was somnolent but able to arouse, lungs with bilateral rales, heart with S1 and S2 present and rhythmic, abdomen soft and positive lower extremity edema.   Patient was admitted to the ICU.  She was placed on heparin for NSTEMI, peak troponin 5916 Hospitalization complicated with embolic CVA.    Assessment and Plan: * Acute on chronic combined systolic and diastolic CHF (congestive heart failure) (HCC)- (present on admission) Mild to moderate mitral regurgitation Acute hypoxic hypercapnic respiratory failure Echocardiogram 12/31/2021 has demonstrated EF 45-50%, severe RV enlargement with dysfunction   Continue to improve volume status with diuresis. Continue close follow up on blood pressure and urine output.   H/O urinary retention Chronic Foley, last changed in the end of January, will change this today  UTI (urinary tract infection)- (present on admission) Related to chronic indwelling foley cathter.  -Urine culture has grown out Klebsiella pneumoniae and Serratia Marcescens. Both are sensitive to Rocephin, completed course Continue change foley evert 4 weeks.   Acute CVA (cerebrovascular accident) ([place]) On admission the patient was found to have  dysarthria. - MRI brain noted scattered tine acute infarcts bilaterally consistent with embolic stroke, known history of A-fib, was not on anticoagulation due to history of diverticular bleeds, neurology consulted, started on Eliquis, carotid duplex noted minimal plaque in the extracranial vessels, LDL 47, A1c is 5.9 -PT OT eval completed, SNF recommended, declines SNF  Patient will continue therapy with home health services.   Acute renal failure (ARF) (HCC) AKI on CKD 3b Cardiorenal syndrome w/ ischemic ATN secondary to diuretics Lasix Entresto Jardiance etc. -Creatinine peaked at 5.4 -Renal ultrasound negative for hydronephrosis -Diuresed with IV Lasix and albumin, now on torsemide -Nephrology following, seen by palliative care as well, wishes for full code and full scope of treatment -See discussion above, kidney function stabilizing, HD catheter removed  Thrombocytopenia (HCC) -Platelet count dropped to 55 K, now normal, likely related to critical illness  Acute renal failure superimposed on stage 3b chronic kidney disease ([place])- (present on admission) hypokalemia Renal function with serum cr at 3.0 with K at 3,3 and serum bicarbonate at 37.   Add 40 meq KCl and follow up renal function and electrolytes in am.  Continue diuresis with torsemide and blood pressure support with midodrine.   Anemia of chronic renal disease. hgb is 9,3 and hct 31. Plan to continue follow up on cell count.  Thrombocytopenia has resolved.   Phosphate is 3,2  PAF (paroxysmal atrial fibrillation) ([place])- (present on admission) Status post ablation in 2020 and amiodarone was stopped.   Patient back on anticoagulation with apixaban, in the past anticoagulation was held due to diverticular bleed.   CAD in native artery, s/p cardiac cath with non obstructive CAD- (present on admission)  NSTEMI CAD in native artery, s/p cardiac cath with non obstructive CAD- (present on admission)  Patient was treated  medically with IV heparin No further chest pain.         Subjective: Patient is out of bed to chair, with no dyspnea or chest pain, improving lower extremity edema.   Physical Exam: Vitals:   01/15/22 0400 01/15/22 0719 01/15/22 1046 01/15/22 1545  BP: 132/67 131/61 126/62 (!) 122/91  Pulse: 79 70 79 81  Resp: (!) 21 18 19 20   Temp: 98.2 F (36.8 C) 98.4 F (36.9 C) 97.7 F (36.5 C) 97.8 F (36.6 C)  TempSrc: Oral Oral Oral Oral  SpO2: 96% 94% 94% 96%  Weight: 105 kg     Height:       Neurology awake and alert ENT with mild pallor Cardiovascular with S1 and S2 present irregularly irregular with systolic murmur at the apex with no gallops No JVD Trace lower extremity edema Respiratory with no rales or wheezing Abdomen not distended   Data Reviewed:    Family Communication: no family at the bedside   Disposition: Status is: Inpatient Remains inpatient appropriate because:  pending renal function to be more stable in order to discharge home      Planned Discharge Destination: Home     Author: Tawni Millers, MD 01/15/2022 6:07 PM  For on call review [email or web address].

## 2022-01-15 NOTE — Hospital Course (Addendum)
Patricia Wagner was admitted to the hospital with the working diagnosis of decompensated heart failure.   71 yo female with the past medical history of atrial fibrillation, CKD stage 3b, systolic heart failure, CVA, COPD and hypertension who presented with 5 days of diarrhea and pre syncope episode. Patient was diagnosed with hypoxemia, but declined to be transported to the ED. The following day patient developed slurred speech and right arm weakness. On her initial physical examination her 02 saturation was 70 to 80%, and she was placed on Bipap, BP 95/58, HR 79, RR 18. She was somnolent but able to arouse, lungs with bilateral rales, heart with S1 and S2 present and rhythmic, abdomen soft and positive lower extremity edema.   Na 136, K 4.5, CL 104, bicarbonate 16, glucose 97, bun 128, cr 4,95 Anion gap 16.  pH 7.13, Pc02 62.3, Pa02 79, bicarb 21 BNP > 4,500  High sensitive troponin 5,961  Wbc 12.9, hgb 12,8, ct 41,9 and plt 98  Sars covid 19 negative   Urine with sg 1,013 with >300 protein, >50 wbc  Toxicology negative  Head Ct with old left cerebellar infarct.   Chest radiograph with cardiomegaly and bilateral hilar vascular congestion.  EKG with 87 bpm, right axis deviation, normal intervals, right atrial enlargement, sinus rhythm with PVC, poor R wave progression no significant ST segment changes, or T wave changes.   Patient was placed on a bicarbonate infusion and she was admitted to the ICU.  She was placed on non invasive mechanical ventilation, and received diuresis with furosemide.  Antibiotic therapy for urine infection, (related with chronic indwelling foley catheter).  She was placed on heparin for NSTEMI, peak troponin 5916  02/09 hemodialysis cathter was placed in preparation for renal replacement therapy.   Hospitalization complicated with embolic CVA.   02/12 brain MRI with scattered punctate acute bilateral cerebral and right cerebellar infarcts suggesting a central embolic  source.   Patient developed worsening renal function and nephrology was consulted.  Diuretic therapy has been adjusted with improvement in volume status.  Patient has declined SNF and she is not candidate for inpatient rehab. Plan to discharge home with home health services.

## 2022-01-15 NOTE — Assessment & Plan Note (Addendum)
Hypokalemia, hypomagnesemia.  Patient responded to diuretic therapy along with supportive medical therapy. Acute cardiorenal syndrome and ischemic acute tubular necrosis in the setting of hypotension and acute biventricular heart failure and concomitant ARB therapy.   She did not required renal replacement therapy and HD cathter was removed.   At her discharge patient will continue diuresis with torsemide and will need close follow up as outpatient.   At her discharge her renal function had a serum cr od 2,79 with K at 3,3 and serum bicarbonate at 41.   Anemia of chronic renal disease. hgb is 9,3 and hct 31.  Transitory thrombocytopenia resolved.

## 2022-01-15 NOTE — Progress Notes (Signed)
Occupational Therapy Treatment Patient Details Name: Patricia Wagner MRN: [id number] DOB: [birth year] Today's Date: 01/15/2022   History of present illness Pt is a 71 y.o. female admitted 12/30/21 with SOB, AMS, slurred speech. Initially admitted as code stroke; head CT 2/6 negative for acute finding; neurology suspects slurred speech related to hypoxia given abscence of other focal neurodeficits. Workup for metabolic encephalopathy, AKI on CKD, severe metabolic/respiratory acidosis requiring bicarbonate infusion and BiPAP, probable UTI. MRI completed 2/12 due to continued slurred speech. MRI revealed scattered punctate acute bilateral cerebral and right cerebellar infarcts suggesting a central embolic source. PMH includes CKD, CHF, RA, COPD, CVA, CAD, HTN, HLD, Raynaud's disease.   OT comments  Pt progressing well towards mobility goals with min guard for Rollator trial though fatigues quickly with household distances. Pt continues to require extensive assist for LB ADLs, including toileting hygiene today. Pt reports unable to reach posterior region, declined to attempt and demanded staff complete task instead. When attempting to elicit conversation regarding PLOF, typical ADL routine and how pt plans to manage at DC (still adamantly declining SNF), pt becomes angry and unwilling to discuss further. Pt also reports plan to not wear supplemental O2 if discharged with it. Continue to recommend SNF rehab at DC. However, rec max HH services if pt continues to decline rehab.    Recommendations for follow up therapy are one component of a multi-disciplinary discharge planning process, led by the attending physician.  Recommendations may be updated based on patient status, additional functional criteria and insurance authorization.    Follow Up Recommendations  Skilled nursing-short term rehab (<3 hours/day)    Assistance Recommended at Discharge Frequent or constant Supervision/Assistance  Patient can return  home with the following  A little help with walking and/or transfers;A lot of help with bathing/dressing/bathroom;Assistance with cooking/housework;Direct supervision/assist for medications management;Direct supervision/assist for financial management;Assist for transportation;Help with stairs or ramp for entrance   Equipment Recommendations  Hospital bed;Toilet rise with handles    Recommendations for Other Services      Precautions / Restrictions Precautions Precautions: Fall;Other (comment) Precaution Comments: central line, chronic foley catheter Restrictions Weight Bearing Restrictions: No       Mobility Bed Mobility               General bed mobility comments: up in chair on entry    Transfers Overall transfer level: Needs assistance Equipment used: Rollator (4 wheels) Transfers: Sit to/from Stand, Bed to chair/wheelchair/BSC Sit to Stand: Min guard     Step pivot transfers: Min guard     General transfer comment: pt able to push from chair armrests/BSC to stand without assist. able to step to [facility] with Rollator. cues needed for brake mgmt     Balance Overall balance assessment: Needs assistance Sitting-balance support: No upper extremity supported, Feet supported Sitting balance-Leahy Scale: Fair     Standing balance support: Bilateral upper extremity supported, During functional activity, Reliant on assistive device for balance Standing balance-Leahy Scale: Poor                             ADL either performed or assessed with clinical judgement   ADL Overall ADL's : Needs assistance/impaired                         Toilet Transfer: Min guard;Stand-pivot;BSC/3in1;Rollator (4 wheels)   Toileting- Clothing Manipulation and Hygiene: Total assistance Toileting - Clothing Manipulation Details (indicate  cue type and reason): pt reports unable to reach to bottom for peri care. when asked for clarification on if this is an issue at home,  pt becomes angry and says "everyone here has been wiping my butt so go ahead and do it. If you dont want to, we can get someone else to do it".     Functional mobility during ADLs: Minimal assistance;Min guard;Rollator (4 wheels);Cueing for sequencing;Cueing for safety General ADL Comments: Pt remains easily agitated when asked about how she typically completes ADLs at home and when asked to attempt tasks without assist as pt adamantly declining SNF rehab.    Extremity/Trunk Assessment Upper Extremity Assessment Upper Extremity Assessment: LUE deficits/detail LUE Deficits / Details: questionable shoulder flexion impairment. able to lift 45* approx, able to bring UE back to don gown around back LUE Coordination: decreased gross motor   Lower Extremity Assessment Lower Extremity Assessment: Defer to PT evaluation        Vision   Vision Assessment?: No apparent visual deficits   Perception     Praxis      Cognition Arousal/Alertness: Awake/alert Behavior During Therapy: Flat affect, Agitated Overall Cognitive Status: No family/caregiver present to determine baseline cognitive functioning                                 General Comments: Pt self-directed but also making contradictory statements and difficulty getting point across at times which can further frustrate her. Pt with condescending comments towards therapist throughout session, refuses to have therapist clsoe by with mobility despite education on fall risk. Easily agitated when asked how pt completes ADL routine at home to gain insight into important tasks pt needs to be able to physically complete at home        Exercises      Shoulder Instructions       General Comments difficulty obtaining O2 pleth on monitor. Pt reports if she discharges with O2, she will just not wear it at home    Pertinent Vitals/ Pain       Pain Assessment Pain Assessment: Faces Faces Pain Scale: Hurts a little bit Pain  Location: back Pain Descriptors / Indicators: Grimacing, Guarding Pain Intervention(s): Repositioned  Home Living                                          Prior Functioning/Environment              Frequency  Min 2X/week        Progress Toward Goals  OT Goals(current goals can now be found in the care plan section)  Progress towards OT goals: Progressing toward goals  Acute Rehab OT Goals Patient Stated Goal: go home OT Goal Formulation: With patient Time For Goal Achievement: 01/29/22 Potential to Achieve Goals: Good ADL Goals Pt Will Perform Lower Body Bathing: with mod assist;sit to/from stand;sitting/lateral leans Pt Will Perform Lower Body Dressing: with mod assist;sitting/lateral leans;sit to/from stand Pt Will Transfer to Toilet: with mod assist;stand pivot transfer;bedside commode Additional ADL Goal #1: Pt to demo bed mobility at Min A in prep for ADL transfers  Plan Discharge plan remains appropriate    Co-evaluation                 AM-PAC OT "6 Clicks" Daily Activity     Outcome  Measure   Help from another person eating meals?: A Little Help from another person taking care of personal grooming?: A Little Help from another person toileting, which includes using toliet, bedpan, or urinal?: Total Help from another person bathing (including washing, rinsing, drying)?: A Lot Help from another person to put on and taking off regular upper body clothing?: A Little Help from another person to put on and taking off regular lower body clothing?: Total 6 Click Score: 13    End of Session Equipment Utilized During Treatment: Gait belt;Rollator (4 wheels);Oxygen  OT Visit Diagnosis: Unsteadiness on feet (R26.81);Other abnormalities of gait and mobility (R26.89);Muscle weakness (generalized) (M62.81);Other symptoms and signs involving cognitive function   Activity Tolerance Patient tolerated treatment well;Treatment limited secondary to  agitation   Patient Left in chair;with call bell/phone within reach;with chair alarm set   Nurse Communication Mobility status        Time: 5456-2563 OT Time Calculation (min): 45 min  Charges: OT General Charges $OT Visit: 1 Visit OT Treatments $Self Care/Home Management : 8-22 mins $Therapeutic Activity: 23-37 mins  Malachy Chamber, OTR/L Acute Rehab Services Office: [phone]   Layla Maw 01/15/2022, 2:11 PM

## 2022-01-15 NOTE — TOC Progression Note (Signed)
Transition of Care [facility]) - Progression Note    Patient Details  Name: Patricia Wagner MRN: [id number] Date of Birth: 1951-06-16  Transition of Care [facility]) CM/SW Contact  Zenon Mayo, RN Phone Number: 01/15/2022, 5:01 PM  Clinical Narrative:    NCM spoke with patient at bedside, she lives alone, offered choice with Medicare.gov list, she does not have a prefereence. NCM made referral to [facility] with [employer] for HHRN,HHPT,HHOT, HHAIDIE and Social Work.  She is able to take referral.  Soc will begin 24 to 48 hrs post dc.  Patient also wants a 3 n 1. NCM made referral to [facility] with [employer]. Lef[facility] vm . Will need to check with her tomorrow to make sure she got the vm for 3 n 1.    Expected Discharge Plan: [place] Barriers to Discharge: Continued Medical Work up  Expected Discharge Plan and Services Expected Discharge Plan: [place] In-house Referral: Clinical Social Work Discharge Planning Services: CM Consult Post Acute Care Choice: Durable Medical Equipment, Home Health Living arrangements for the past 2 months: Apartment                 DME Arranged: 3-N-1 DME Agency: [employer] Date DME Agency Contacted: 01/15/22 Time DME Agency Contacted: 1700 Representative spoke with at DME Agency: Adela Lank- left vm HH Arranged: RN, PT, OT, Nurse's Aide, Social Work [employer] Agency: [place] Date [place]: 01/15/22 Time [place]: 1700 Representative spoke with at [place]: [place] ([place]) Interventions Food Insecurity Interventions: Intervention Not Indicated Financial Strain Interventions: Intervention Not Indicated Housing Interventions: Intervention Not Indicated Transportation Interventions: Intervention Not Indicated  Readmission Risk Interventions No flowsheet data found.

## 2022-01-16 DIAGNOSIS — I2510 Atherosclerotic heart disease of native coronary artery without angina pectoris: Secondary | ICD-10-CM | POA: Diagnosis not present

## 2022-01-16 DIAGNOSIS — I639 Cerebral infarction, unspecified: Secondary | ICD-10-CM | POA: Diagnosis not present

## 2022-01-16 DIAGNOSIS — I5043 Acute on chronic combined systolic (congestive) and diastolic (congestive) heart failure: Secondary | ICD-10-CM | POA: Diagnosis not present

## 2022-01-16 DIAGNOSIS — N170 Acute kidney failure with tubular necrosis: Secondary | ICD-10-CM | POA: Diagnosis not present

## 2022-01-16 LAB — BASIC METABOLIC PANEL
Anion gap: 11 (ref 5–15)
BUN: 87 mg/dL — ABNORMAL HIGH (ref 8–23)
CO2: 39 mmol/L — ABNORMAL HIGH (ref 22–32)
Calcium: 8.6 mg/dL — ABNORMAL LOW (ref 8.9–10.3)
Chloride: 90 mmol/L — ABNORMAL LOW (ref 98–111)
Creatinine, Ser: 3.01 mg/dL — ABNORMAL HIGH (ref 0.44–1.00)
GFR, Estimated: 16 mL/min — ABNORMAL LOW (ref >60)
Glucose, Bld: 91 mg/dL (ref 70–99)
Potassium: 3.4 mmol/L — ABNORMAL LOW (ref 3.5–5.1)
Sodium: 140 mmol/L (ref 135–145)

## 2022-01-16 LAB — MAGNESIUM: Magnesium: 1.8 mg/dL (ref 1.7–2.4)

## 2022-01-16 MED ORDER — POTASSIUM CHLORIDE CRYS ER 20 MEQ PO TBCR
40.0000 meq | EXTENDED_RELEASE_TABLET | Freq: Once | ORAL | Status: AC
  Administered 2022-01-16: 40 meq via ORAL
  Filled 2022-01-16: qty 2

## 2022-01-16 MED ORDER — ATORVASTATIN CALCIUM 10 MG PO TABS
20.0000 mg | ORAL_TABLET | Freq: Every day | ORAL | Status: DC
  Administered 2022-01-16 – 2022-01-18 (×3): 20 mg via ORAL
  Filled 2022-01-16 (×3): qty 2

## 2022-01-16 MED ORDER — MAGNESIUM SULFATE 2 GM/50ML IV SOLN
2.0000 g | Freq: Once | INTRAVENOUS | Status: AC
  Administered 2022-01-16: 2 g via INTRAVENOUS
  Filled 2022-01-16: qty 50

## 2022-01-16 NOTE — Progress Notes (Signed)
Progress Note   Patient: Patricia Wagner DJM:[id number] DOB: Sep 22, 1951 DOA: 12/30/2021     16 DOS: the patient was seen and examined on 01/16/2022   Brief hospital course: Patricia Wagner was admitted to the hospital with the working diagnosis of decompensated heart failure.   71 yo female with the past medical history of atrial fibrillation, CKD stage 3b, systolic heart failure, CVA, COPD and hypertension who presented with 5 days of diarrhea and pre syncope episode. Patient was diagnosed with hypoxemia, but declined to be transported to the ED. The following day patient developed slurred speech and right arm weakness. On her initial physical examination her 02 saturation was 70 to 80%, and she was placed on Bipap, BP 95/58, HR 79, RR 18. She was somnolent but able to arouse, lungs with bilateral rales, heart with S1 and S2 present and rhythmic, abdomen soft and positive lower extremity edema.   Patient was admitted to the ICU.  She was placed on heparin for NSTEMI, peak troponin 5916 Hospitalization complicated with embolic CVA.   Patient developed worsening renal function and nephrology was consulted.  Diuretic therapy has been adjusted with improvement in volume status/ Patient has declined SNF. Will consult with CIR for evaluation.     Assessment and Plan: * Acute on chronic combined systolic and diastolic CHF (congestive heart failure) (HCC)- (present on admission) Mild to moderate mitral regurgitation Acute hypoxic hypercapnic respiratory failure Echocardiogram 12/31/2021 has demonstrated EF 45-50%, severe RV enlargement with dysfunction   Patient has responded well to diuresis Continue with torsemide.  Not on raas inhibition due to risk of worsening renal failure.  Continue midodrine to support blood pressure.   CAD in native artery, s/p cardiac cath with non obstructive CAD- (present on admission) NSTEMI CAD in native artery, s/p cardiac cath with non obstructive CAD- (present on  admission)  Patient was treated medically with IV heparin No further chest pain.   H/O urinary retention Chronic Foley, last changed in the end of January, will change this today  UTI (urinary tract infection)- (present on admission) Related to chronic indwelling foley cathter.  -Urine culture has grown out Klebsiella pneumoniae and Serratia Marcescens. Both are sensitive to Rocephin, completed course Continue change foley evert 4 weeks.   Acute CVA (cerebrovascular accident) Patricia Wagner) On admission the patient was found to have dysarthria.  MRI brain noted scattered tine acute infarcts bilaterally consistent with embolic stroke.  Carotid duplex noted minimal plaque in the extracranial vessels,  Plan to continue blood pressure support with midodrine, anticoagulation with apixaban and will add low dose statin therapy.   Acute renal failure (ARF) (HCC) AKI on CKD 3b Cardiorenal syndrome w/ ischemic ATN secondary to diuretics Lasix Entresto Jardiance etc. -Creatinine peaked at 5.4 -Renal ultrasound negative for hydronephrosis -Diuresed with IV Lasix and albumin, now on torsemide -Nephrology following, seen by palliative care as well, wishes for full code and full scope of treatment -See discussion above, kidney function stabilizing, HD catheter removed  Thrombocytopenia (HCC) -Platelet count dropped to 55 K, now normal, likely related to critical illness  Acute renal failure superimposed on stage 3b chronic kidney disease ([place])- (present on admission) Hypokalemia, hypomagnesemia.  Serum cr today is 3,0 with K at 3,4 and serum bicarbonate at 39.  Continue diuresis with torsemide, her volume status is improving.   Continue K correction with Kc 40 meq and add 2 g mag sulfate.   Anemia of chronic renal disease. hgb is 9,3 and hct 31. Plan to continue follow  up on cell count.  Thrombocytopenia has resolved.    PAF (paroxysmal atrial fibrillation) ([place])- (present on admission) Status  post ablation in 2020 and amiodarone was stopped.    Reate continue to be well controlled, continue anticoagulation with apixaban with good toleration.  Continue telemetry monitoring.       Subjective: Patient continue to be very weak and deconditioned, no nausea or vomiting, no chest pain or dyspnea, lower extremity edema has been improving.   Physical Exam: Vitals:   01/16/22 0014 01/16/22 0540 01/16/22 0710 01/16/22 1122  BP: (!) 110/57 137/82 (!) 116/53 126/68  Pulse: 73 79 78 80  Resp: (!) 22 (!) 21 20 19   Temp: 98 F (36.7 C) 98.7 F (37.1 C) 98.3 F (36.8 C) 97.7 F (36.5 C)  TempSrc: Oral Oral Oral Oral  SpO2: 98% 91% 98% 98%  Weight:  103 kg    Height:       Neurology awake and alert ENT with  no pallor Cardiovascular with S1 and S2 present irregularly irregular with no murmurs or gallops no rubs No JVD  Trace lower extremity edema with unna boots in place Respiratory with no wheezing or rales Abdomen soft and non tender.   Data Reviewed:    Family Communication: no family at the bedside. I spoke over the phone with the patient's daughter about patient's  condition, plan of care, prognosis and all questions were addressed.   Disposition: Status is: Inpatient Remains inpatient appropriate because: pending renal function to be more stable and inpatient rehab evaluation      Planned Discharge Destination: Home possible CIR, patient declined SNF   Author: Tawni Millers, MD 01/16/2022 2:13 PM  For on call review [email or web address].

## 2022-01-16 NOTE — Progress Notes (Signed)
Physical Therapy Treatment Patient Details Name: Patricia Wagner MRN: [id number] DOB: Mar 26, 1951 Today's Date: 01/16/2022   History of Present Illness Pt is a 71 y.o. female admitted 12/30/21 with SOB, AMS, slurred speech. Initially admitted as code stroke; head CT 2/6 negative for acute finding; neurology suspects slurred speech related to hypoxia given abscence of other focal neurodeficits. Workup for metabolic encephalopathy, AKI on CKD, severe metabolic/respiratory acidosis requiring bicarbonate infusion and BiPAP, probable UTI. MRI completed 2/12 due to continued slurred speech. MRI revealed scattered punctate acute bilateral cerebral and right cerebellar infarcts suggesting a central embolic source. PMH includes CKD, CHF, RA, COPD, CVA, CAD, HTN, HLD, Raynaud's disease.    PT Comments    The pt was agreeable to session with focus on progressing mobility and ambulation this session. She continues to demo significant deficits in LE strength and power, needing up to maxA to rise to standing, even from elevated surface. The pt has shown significant improvement in participation and motivation last few sessions and is now expressing desire for acute inpatient rehab rather than d/c home. The pt would benefit from continued rehab prior to d/c home and given recent progress would likely be better able to tolerate intensity. When directly asked about 3 hours of therapy/day the pt responded she would "do what she had to do". Will continue to benefit from skilled PT acutely to further progress functional strength, endurance, and independence to allow for eventual return home.     Recommendations for follow up therapy are one component of a multi-disciplinary discharge planning process, led by the attending physician.  Recommendations may be updated based on patient status, additional functional criteria and insurance authorization.  Follow Up Recommendations  Acute inpatient rehab (3hours/day) (if pt with  continued participation/motivation she could benefit from AIR prior to return home, if denied continue to recommend SNF)     Assistance Recommended at Discharge Frequent or constant Supervision/Assistance  Patient can return home with the following Two people to help with walking and/or transfers;Two people to help with bathing/dressing/bathroom;Help with stairs or ramp for entrance;Assist for transportation;Assistance with Education officer, environmental (measurements PT);Wheelchair cushion (measurements PT)    Recommendations for Other Services       Precautions / Restrictions Precautions Precautions: Fall;Other (comment) Precaution Comments: central line, chronic foley catheter Restrictions Weight Bearing Restrictions: No     Mobility  Bed Mobility Overal bed mobility: Needs Assistance             General bed mobility comments: pt sitting EOB upon my arrival    Transfers Overall transfer level: Needs assistance Equipment used: Rolling walker (2 wheels) Transfers: Sit to/from Stand, Bed to chair/wheelchair/BSC Sit to Stand: Mod assist, From elevated surface   Step pivot transfers: Min guard       General transfer comment: modA to maxA to stand from elevated bed. pt needing modA and cues initially, slow to power up with poor strength and force through BLE. on second attempt pt unable to rise without maxA and needed modA and increased time as the pt was leaning against bed and lifting RW into the air.    Ambulation/Gait Ambulation/Gait assistance: Min guard Gait Distance (Feet): 30 Feet (+ 30 ft) Assistive device: Rolling walker (2 wheels) Gait Pattern/deviations: Step-through pattern, Decreased stride length, Trunk flexed, Wide base of support Gait velocity: decreased Gait velocity interpretation: <1.31 ft/sec, indicative of household ambulator   General Gait Details: slow but generally steady with poor clearance and stride length  limited by fatigue but agreeable to continued mobility after seated rest      Balance Overall balance assessment: Needs assistance Sitting-balance support: No upper extremity supported, Feet supported Sitting balance-Leahy Scale: Fair     Standing balance support: Bilateral upper extremity supported, During functional activity, Reliant on assistive device for balance Standing balance-Leahy Scale: Poor Standing balance comment: Needs min guard assist and use of RW for support in standing.                            Cognition Arousal/Alertness: Awake/alert Behavior During Therapy: Flat affect, Agitated Overall Cognitive Status: No family/caregiver present to determine baseline cognitive functioning                                 General Comments: pt able to verbalize needs, but remains particular about how therapist completes each task. the pt was able to follow simple cues/commands. becomes frustrated when asked who could assist her at home and asked PT to stop this line of discussion        Exercises      General Comments General comments (skin integrity, edema, etc.): VSS on 2L with SpO2 >90%      Pertinent Vitals/Pain Pain Assessment Pain Assessment: Faces Faces Pain Scale: Hurts a little bit Pain Location: back Pain Descriptors / Indicators: Grimacing, Guarding Pain Intervention(s): Limited activity within patient's tolerance, Monitored during session, Repositioned     PT Goals (current goals can now be found in the care plan section) Acute Rehab PT Goals Patient Stated Goal: home PT Goal Formulation: With patient Time For Goal Achievement: 01/29/22 Potential to Achieve Goals: Fair Progress towards PT goals: Progressing toward goals    Frequency    Min 3X/week      PT Plan Discharge plan needs to be updated       AM-PAC PT "6 Clicks" Mobility   Outcome Measure  Help needed turning from your back to your side while in a flat  bed without using bedrails?: A Little Help needed moving from lying on your back to sitting on the side of a flat bed without using bedrails?: A Little Help needed moving to and from a bed to a chair (including a wheelchair)?: A Lot Help needed standing up from a chair using your arms (e.g., wheelchair or bedside chair)?: A Lot Help needed to walk in hospital room?: A Little Help needed climbing 3-5 steps with a railing? : Total 6 Click Score: 14    End of Session Equipment Utilized During Treatment: Gait belt;Oxygen Activity Tolerance: Patient tolerated treatment well Patient left: with call bell/phone within reach;in bed (sitting EOB) Nurse Communication: Mobility status PT Visit Diagnosis: Other abnormalities of gait and mobility (R26.89);Muscle weakness (generalized) (M62.81)     Time: 4098-1191 PT Time Calculation (min) (ACUTE ONLY): 28 min  Charges:  $Therapeutic Exercise: 8-22 mins $Therapeutic Activity: 8-22 mins                     West Carbo, PT, DPT   Acute Rehabilitation Department Pager #: [phone]   Sandra Cockayne 01/16/2022, 2:31 PM

## 2022-01-16 NOTE — Progress Notes (Signed)
Patient ID: Patricia Wagner, female   DOB: 30-Jun-1951, 71 y.o.   MRN: [id number] S: Feels better. O:BP (!) 116/53    Pulse 78    Temp 98.3 F (36.8 C) (Oral)    Resp 20    Ht 5\' 4"  (1.626 m)    Wt 103 kg    SpO2 98%    BMI 38.98 kg/m   Intake/Output Summary (Last 24 hours) at 01/16/2022 1034 Last data filed at 01/16/2022 0756 Gross per 24 hour  Intake 600 ml  Output 2400 ml  Net -1800 ml   Intake/Output: I/O last 3 completed shifts: In: 600 [P.O.:600] Out: 4200 [Urine:4200]  Intake/Output this shift:  Total I/O In: 240 [P.O.:240] Out: -  Weight change: -2 kg Gen: NAD CVS: RRR Resp:CTA Abd:+BS, soft, NT/ND Ext:1+ edema, UNNA boots in place  Recent Labs  Lab 01/10/22 0436 01/11/22 0431 01/11/22 0440 01/12/22 0513 01/13/22 0338 01/14/22 0257 01/15/22 0352 01/16/22 0425  NA 142 143 142 142 141 143 140 140  K 3.3* 4.1 4.1 3.3* 3.2* 3.6 3.3* 3.4*  CL 101 99 98 96* 95* 96* 91* 90*  CO2 31 32 32 35* 33* 35* 37* 39*  GLUCOSE 98 91 90 92 90 87 92 91  BUN 114* 115* 115* 111* 107* 100* 95* 87*  CREATININE 3.71* 4.01* 3.94* 3.57* 3.56* 3.22* 3.05* 3.01*  ALBUMIN 2.4*  --  2.7* 2.6* 2.6*  --   --   --   CALCIUM 8.4* 8.7* 8.6* 8.3* 8.5* 8.5* 8.5* 8.6*  PHOS 4.8*  --  4.6 4.0 3.2  --   --   --    Liver Function Tests: Recent Labs  Lab 01/11/22 0440 01/12/22 0513 01/13/22 0338  ALBUMIN 2.7* 2.6* 2.6*   No results for input(s): LIPASE, AMYLASE in the last 168 hours. No results for input(s): AMMONIA in the last 168 hours. CBC: Recent Labs  Lab 01/11/22 0431 01/12/22 0513 01/13/22 0338 01/14/22 0257 01/15/22 0352  WBC 9.3 8.6 10.2 8.3 5.6  HGB 10.7* 10.1* 9.7* 9.9* 9.3*  HCT 35.2* 34.1* 32.7* 32.7* 31.2*  MCV 91.0 91.7 91.3 90.8 89.7  PLT 192 187 205 215 199   Cardiac Enzymes: No results for input(s): CKTOTAL, CKMB, CKMBINDEX, TROPONINI in the last 168 hours. CBG: No results for input(s): GLUCAP in the last 168 hours.  Iron Studies: No results for input(s): IRON,  TIBC, TRANSFERRIN, FERRITIN in the last 72 hours. Studies/Results: No results found.  apixaban  5 mg Oral BID   chlorhexidine  15 mL Mouth Rinse BID   Chlorhexidine Gluconate Cloth  6 each Topical Daily   Gerhardt's butt cream   Topical Daily   mouth rinse  15 mL Mouth Rinse q12n4p   mexiletine  300 mg Oral Q12H   midodrine  10 mg Oral TID WC   pantoprazole  40 mg Oral Daily   sodium chloride flush  10-40 mL Intracatheter Q12H   torsemide  80 mg Oral BID    BMET    Component Value Date/Time   NA 140 01/16/2022 0425   NA 139 04/17/2021 1608   K 3.4 (L) 01/16/2022 0425   CL 90 (L) 01/16/2022 0425   CO2 39 (H) 01/16/2022 0425   GLUCOSE 91 01/16/2022 0425   BUN 87 (H) 01/16/2022 0425   BUN 34 (H) 04/17/2021 1608   CREATININE 3.01 (H) 01/16/2022 0425   CREATININE 1.78 (H) 10/15/2021 1508   CALCIUM 8.6 (L) 01/16/2022 0425   GFRNONAA 16 (L) 01/16/2022  0425   GFRNONAA 40 (L) 02/01/2021 1524   GFRAA 46 (L) 02/01/2021 1524   CBC    Component Value Date/Time   WBC 5.6 01/15/2022 0352   RBC 3.48 (L) 01/15/2022 0352   HGB 9.3 (L) 01/15/2022 0352   HGB 12.5 04/17/2021 1608   HCT 31.2 (L) 01/15/2022 0352   HCT 39.6 04/17/2021 1608   PLT 199 01/15/2022 0352   PLT 202 04/17/2021 1608   MCV 89.7 01/15/2022 0352   MCV 88 04/17/2021 1608   MCH 26.7 01/15/2022 0352   MCHC 29.8 (L) 01/15/2022 0352   RDW 15.5 01/15/2022 0352   RDW 13.1 04/17/2021 1608   LYMPHSABS 0.3 (L) 01/03/2022 1637   LYMPHSABS 0.8 04/17/2021 1608   MONOABS 1.1 (H) 01/03/2022 1637   EOSABS 0.0 01/03/2022 1637   EOSABS 0.0 04/17/2021 1608   BASOSABS 0.0 01/03/2022 1637   BASOSABS 0.0 04/17/2021 1608     Assessment/Plan:  AKI/CKD stage IIIb, non-oliguric - due to cardiorenal syndrome and ischemic ATN in setting of hypotension, acute biventricular CHF and concomitant ARB therapy.  Baseline Scr 1.4-1.6 and normally follows with Dr. Justin Mend in our office. Peak Scr 5.25 but improved to 3 today with diuresis and  holding ARB and SGLT-2 inhibitor.  She does not want dialysis, but thankfully she has slowly been improving. Acute on chronic biventricular CHF with predominant RV failure - currently on midodrine 10 mg tid, UNNA boots, and torsemide 80 mg bid.  Improving volume status. Acute CVA - CT scan of head on 12/30/21 with old left cerebellar infarct, MRI on 01/05/22 revealed acute bilateral cerebral and right cerebellar infarcts suggestive of embolic source.  Neuro evaluated and felt to be due to atrial fibrillation.  Now on anticoagulation. Thrombocytopenia - improved off heparin.   Paroxysmal atrial fibrillation - stable, on eliquis. UTI - Klebsiella and Serratia, treated with ceftriaxone. Hypotension, chronic - on midodrine 10 mg tid Anemia of acute illness.  stable Acute hypoxic and hypercapnic respiratory failure - improved with diuresis.   Donetta Potts, MD [employer] [phone]

## 2022-01-16 NOTE — Progress Notes (Addendum)
Advanced Heart Failure Rounding Note  PCP-Cardiologist: Glori Bickers, MD   Subjective:   2/20 Mexiletine stopped due to PVC burden.   Reduced PVC burden noted.  Scr peaked at 5.24, trending down to 3.0  Nephrology dosing diuretics. Remains on torsemide.   Adamant she is going home. Remains weak walking. Requires 2 people to walk.    Objective:    103 kg Body mass index is 38.98 kg/m.   Vital Signs:   Temp:  [97.7 F (36.5 C)-98.7 F (37.1 C)] 98.3 F (36.8 C) (02/23 0710) Pulse Rate:  [73-81] 78 (02/23 0710) Resp:  [18-22] 20 (02/23 0710) BP: (108-137)/(53-91) 116/53 (02/23 0710) SpO2:  [91 %-98 %] 98 % (02/23 0710) Weight:  [103 kg] 103 kg (02/23 0540) Last BM Date : 01/15/22  Weight change: Filed Weights   01/13/22 0010 01/15/22 0400 01/16/22 0540  Weight: 102.3 kg 105 kg 103 kg    Intake/Output:   Intake/Output Summary (Last 24 hours) at 01/16/2022 0820 Last data filed at 01/16/2022 0756 Gross per 24 hour  Intake 840 ml  Output 3200 ml  Net -2360 ml      Physical Exam   General:   No resp difficulty HEENT: normal Neck: supple. JVP 6-7  Carotids 2+ bilat; no bruits. No lymphadenopathy or thryomegaly appreciated. Cor: PMI nondisplaced. Regular rate & rhythm. No rubs, gallops or murmurs. Lungs: clear Abdomen: soft, nontender, nondistended. No hepatosplenomegaly. No bruits or masses. Good bowel sounds. Extremities: no cyanosis, clubbing, rash, edema. R and LLE unna boots.  Neuro: alert & orientedx3, cranial nerves grossly intact. moves all 4 extremities w/o difficulty. Affect pleasant      Telemetry   SR 70-80s. PVCs burden 0-10 per hour. Reduced burden.   Labs    CBC Recent Labs    01/14/22 0257 01/15/22 0352  WBC 8.3 5.6  HGB 9.9* 9.3*  HCT 32.7* 31.2*  MCV 90.8 89.7  PLT 215 259   Basic Metabolic Panel Recent Labs    01/14/22 0257 01/15/22 0352 01/16/22 0425  NA 143 140 140  K 3.6 3.3* 3.4*  CL 96* 91* 90*  CO2 35*  37* 39*  GLUCOSE 87 92 91  BUN 100* 95* 87*  CREATININE 3.22* 3.05* 3.01*  CALCIUM 8.5* 8.5* 8.6*  MG 1.8  --  1.8   Liver Function Tests No results for input(s): AST, ALT, ALKPHOS, BILITOT, PROT, ALBUMIN in the last 72 hours.  No results for input(s): LIPASE, AMYLASE in the last 72 hours. Cardiac Enzymes No results for input(s): CKTOTAL, CKMB, CKMBINDEX, TROPONINI in the last 72 hours.  BNP: BNP (last 3 results) Recent Labs    12/30/21 2342  BNP >4,500.0*    ProBNP (last 3 results) No results for input(s): PROBNP in the last 8760 hours.   D-Dimer No results for input(s): DDIMER in the last 72 hours. Hemoglobin A1C No results for input(s): HGBA1C in the last 72 hours.  Fasting Lipid Panel No results for input(s): CHOL, HDL, LDLCALC, TRIG, CHOLHDL, LDLDIRECT in the last 72 hours.  Thyroid Function Tests No results for input(s): TSH, T4TOTAL, T3FREE, THYROIDAB in the last 72 hours.  Invalid input(s): FREET3  Other results:   Imaging    No results found.   Medications:     Scheduled Medications:  apixaban  5 mg Oral BID   chlorhexidine  15 mL Mouth Rinse BID   Chlorhexidine Gluconate Cloth  6 each Topical Daily   Gerhardt's butt cream   Topical Daily  mouth rinse  15 mL Mouth Rinse q12n4p   mexiletine  300 mg Oral Q12H   midodrine  10 mg Oral TID WC   pantoprazole  40 mg Oral Daily   sodium chloride flush  10-40 mL Intracatheter Q12H   torsemide  80 mg Oral BID    Infusions:  sodium chloride Stopped (01/04/22 2119)   sodium chloride      PRN Medications: sodium chloride, acetaminophen **OR** acetaminophen, heparin, levalbuterol, loperamide, sodium chloride flush    Patient Profile   71 y/o female w/ chronic biventricular heart failure/ nonischemic CM, felt likely tachymediated from Afib/PVCs, Stage IIIb CKD and neurogenic bladder w/ urinary retention w/ chronic foley, admitted w/ acute hypoxic respiratory failure and AKI, in setting of UTI and  a/c CHF.   Assessment/Plan   1. Acute on Chronic Biventricular Heart Failure, w/ Predominant RV Failure - NICM, LHC 2017 w/ mild nonobstructive CAD - Echo 6/18 EF 15% in setting of tachy CM from AF - Echo 02/2018: EF 40-45%, grade 1 DD, RV normal - Echo 8/20 45-50% Personally reviewed - Echo 9/22 EF read as 35-40% (I think closer to 30%) - suspect tachymediated CM from Afib and high PVC burden  - Previous Amyloid w/u negative with negative fat pad biopsy and PYP scan (2018).  - Echo this admit, EF 40-45%, RV severely enlarged w/ moderately reduced systolic function  - Now w/ a/c CHF c/b AKI on Stage lllb CKD  - Volume status looks stable.  - Nephrology dosing diuretics. On 80 mg Torsemide BID. - Unable to tolerate GDMT w/ hypotension and AKI. Home entresto and jardiance on hold - Continue midodrine 10 tid for BP support, titrate further if needed  - UNNA boots   2. AKI on CKD Stage IIIb - Baseline SCr 1.4-1.6 - Scr peaked at 5.25, 3.0 today   - nephrology following, felt to be ATN + component of cardiorenal syndrome. - Nephrology dosing diuretics as above. She does not want dialysis   3. Acute Hypoxic Respiratory Failure - O2 sats 70-80s on admit, requiring BiPAP  - Flu and Covid negative  - admit CXR showed vascular congestion but diuretics held on admit due to suspected hypovolemic AKI in setting of diarrhea. Now diuresing with IV lasix. - O2 now stable on 2L [place]   4. UTI  - chronic foley for neurogenic bladder w/ urinary retention  - UA + on admit - UCx + Klebsiella Pneumoniae + Serratia Marcescens  - Treating w/ ceftriaxone (sensitive on susceptibilities) per primary team   5. Tricuspid Regurgitation  - trivial TR on echo 9/22 - severe on echo this admit - likely functional, RV severely enlarged    6. PVCs/ NSVT - Zio 7/22 showed 7.0% burden, started on mexiletine (held on admit) - Amio gtt off.  - PVC burden reduced on mexiletine to 300 mg BID - Supp K Replace Mag    7. NSTEMI  - Hs trop 5,916>>5,616>>5,916 - in the setting of acute renal failure  -  LHC 2017 minimal CAD - No plans for LHC    8. PAF - Hx AF ablation -Maintaining SR.  - Had not been on anticoagulation given h/o recurrent GIBs d/t diverticular disease.  - Now on Eliquis 5 mg BID given embolic CVA this admit. Watch for bleeding.   9. Thrombocytopenia - Plt 98 on admit, down to 50s-60s. Improving last few days. Up to 205 - off heparin, but doubt HIT given plts were low at baseline - suspect 2/2  critical illness  - CBC in am.   10. Acute CVA -CT head on 02/06 with old left cerebellar infarct. No acute finding. -Initially thought to be d/t hypoxia.  -MRI brain 02/12 with scattered punctate acute b/l cerebral and right cerebellar infarcts suggesting central embolic source -MRA with no intracranial LVO, chronically occluded left vertebral artery -Neurology evaluated. Felt to be due to afib. Now on anticoagulation.   PT/OT recommending SNF for rehab but she refuses. States friends in area can assist her. Agrees to [facility] PT.  Palliative Care on board for [place] discussion. Remains full code.  Length of Stay: [place], NP  01/16/2022, 8:20 AM  Advanced Heart Failure Team Pager [phone] (M-F; 7a - 5p)  Please contact [facility] for night-coverage after hours (5p -7a ) and weekends on [email or web address]  Patient seen and examined with the above-signed Advanced Practice Provider and/or Housestaff. I personally reviewed laboratory data, imaging studies and relevant notes. I independently examined the patient and formulated the important aspects of the plan. I have edited the note to reflect any of my changes or salient points. I have personally discussed the plan with the patient and/or family.  Volume status continues to improve. Scr stable at 3.0. Denies CP or SOB. Adamant that she is going home  General:  Weak appearing. No resp difficulty HEENT: normal Neck: supple. JVP 7-8 Carotids  2+ bilat; no bruits. No lymphadenopathy or thryomegaly appreciated. Cor: PMI nondisplaced. Regular rate & rhythm. No rubs, gallops or murmurs. Lungs: clear Abdomen: soft, nontender, nondistended. No hepatosplenomegaly. No bruits or masses. Good bowel sounds. Extremities: no cyanosis, clubbing, rash, tr-1+ edema + UNNA boots Neuro: alert & orientedx3, cranial nerves grossly intact. moves all 4 extremities w/o difficulty. Affect pleasant  Volume status stable. Nephrology managing diuretics. AHF team will sign off. Will arrange outpatient f/u.   Glori Bickers, MD  4:00 PM

## 2022-01-17 ENCOUNTER — Other Ambulatory Visit: Payer: Self-pay | Admitting: Physician Assistant

## 2022-01-17 ENCOUNTER — Other Ambulatory Visit (HOSPITAL_COMMUNITY): Payer: Self-pay

## 2022-01-17 ENCOUNTER — Encounter (HOSPITAL_COMMUNITY): Payer: Self-pay

## 2022-01-17 DIAGNOSIS — I2510 Atherosclerotic heart disease of native coronary artery without angina pectoris: Secondary | ICD-10-CM | POA: Diagnosis not present

## 2022-01-17 DIAGNOSIS — I5043 Acute on chronic combined systolic (congestive) and diastolic (congestive) heart failure: Secondary | ICD-10-CM | POA: Diagnosis not present

## 2022-01-17 DIAGNOSIS — N170 Acute kidney failure with tubular necrosis: Secondary | ICD-10-CM | POA: Diagnosis not present

## 2022-01-17 DIAGNOSIS — I639 Cerebral infarction, unspecified: Secondary | ICD-10-CM | POA: Diagnosis not present

## 2022-01-17 LAB — GLUCOSE, CAPILLARY
Glucose-Capillary: 87 mg/dL (ref 70–99)
Glucose-Capillary: 89 mg/dL (ref 70–99)

## 2022-01-17 LAB — RENAL FUNCTION PANEL
Albumin: 2.8 g/dL — ABNORMAL LOW (ref 3.5–5.0)
Anion gap: 11 (ref 5–15)
BUN: 78 mg/dL — ABNORMAL HIGH (ref 8–23)
CO2: 41 mmol/L — ABNORMAL HIGH (ref 22–32)
Calcium: 8.7 mg/dL — ABNORMAL LOW (ref 8.9–10.3)
Chloride: 88 mmol/L — ABNORMAL LOW (ref 98–111)
Creatinine, Ser: 2.79 mg/dL — ABNORMAL HIGH (ref 0.44–1.00)
GFR, Estimated: 18 mL/min — ABNORMAL LOW (ref >60)
Glucose, Bld: 89 mg/dL (ref 70–99)
Phosphorus: 2.3 mg/dL — ABNORMAL LOW (ref 2.5–4.6)
Potassium: 3.3 mmol/L — ABNORMAL LOW (ref 3.5–5.1)
Sodium: 140 mmol/L (ref 135–145)

## 2022-01-17 MED ORDER — POTASSIUM CHLORIDE CRYS ER 20 MEQ PO TBCR
40.0000 meq | EXTENDED_RELEASE_TABLET | Freq: Once | ORAL | Status: AC
  Administered 2022-01-17: 40 meq via ORAL
  Filled 2022-01-17: qty 2

## 2022-01-17 MED ORDER — APIXABAN 5 MG PO TABS
5.0000 mg | ORAL_TABLET | Freq: Two times a day (BID) | ORAL | 0 refills | Status: AC
  Filled 2022-01-17: qty 60, 30d supply, fill #0

## 2022-01-17 MED ORDER — TORSEMIDE 20 MG PO TABS
80.0000 mg | ORAL_TABLET | Freq: Every day | ORAL | 0 refills | Status: DC
  Filled 2022-01-17: qty 120, 30d supply, fill #0

## 2022-01-17 MED ORDER — TORSEMIDE 20 MG PO TABS
80.0000 mg | ORAL_TABLET | Freq: Every day | ORAL | Status: DC
  Administered 2022-01-18: 80 mg via ORAL
  Filled 2022-01-17: qty 4

## 2022-01-17 MED ORDER — ATORVASTATIN CALCIUM 20 MG PO TABS
20.0000 mg | ORAL_TABLET | Freq: Every day | ORAL | 0 refills | Status: AC
  Filled 2022-01-17: qty 30, 30d supply, fill #0

## 2022-01-17 MED ORDER — MIDODRINE HCL 10 MG PO TABS
10.0000 mg | ORAL_TABLET | Freq: Three times a day (TID) | ORAL | 0 refills | Status: AC
  Filled 2022-01-17: qty 90, 30d supply, fill #0

## 2022-01-17 NOTE — Progress Notes (Signed)
Occupational Therapy Treatment Patient Details Name: Patricia Wagner MRN: [id number] DOB: 1951/06/09 Today's Date: 01/17/2022   History of present illness Pt is a 71 y.o. female admitted 12/30/21 with SOB, AMS, slurred speech. Initially admitted as code stroke; head CT 2/6 negative for acute finding; neurology suspects slurred speech related to hypoxia given abscence of other focal neurodeficits. Workup for metabolic encephalopathy, AKI on CKD, severe metabolic/respiratory acidosis requiring bicarbonate infusion and BiPAP, probable UTI. MRI completed 2/12 due to continued slurred speech. MRI revealed scattered punctate acute bilateral cerebral and right cerebellar infarcts suggesting a central embolic source. PMH includes CKD, CHF, RA, COPD, CVA, CAD, HTN, HLD, Raynaud's disease.   OT comments  Pt with gradual progress towards established OT goals. Pt remains easily irritated by therapist's questions (primarily regarding home setup in order to simulate this in acute setting) though agreeable to "do whatever you tell me". Pt able to demo aspects of LB dressing with Supervision though increased time needed to complete task. Pt continues to require assist to stand from surfaces without armrests but once up, able to mobilize short distances with min guard using RW. Trialed on RA (as pt may be noncompliant if discharged with O2) with desats to 78% though pt denies any SOB. With 1 L O2 and 7 min seated rest break, SpO2 reading 93%.  Noted yesterday that pt inquiring about AIR. However, pt reports today that she is "absolutely not" interested in AIR anymore and just wants to go home. Pt is open to [facility] services and would benefit from these services to maximize safety at home.  Pt requested toilet riser rather than BSC for use at home - CM/SW notified with previous session. However, BSC has been delivered to pt room. Pt reports "I'll just have my daughter order me one online".   Recommendations for follow up therapy  are one component of a multi-disciplinary discharge planning process, led by the attending physician.  Recommendations may be updated based on patient status, additional functional criteria and insurance authorization.    Follow Up Recommendations  Skilled nursing-short term rehab (<3 hours/day) (pt declining SNF, AIR; open to [facility] services)    Assistance Recommended at Discharge Frequent or constant Supervision/Assistance  Patient can return home with the following  A little help with walking and/or transfers;A lot of help with bathing/dressing/bathroom;Assistance with cooking/housework;Direct supervision/assist for medications management;Direct supervision/assist for financial management;Assist for transportation;Help with stairs or ramp for entrance   Equipment Recommendations  Hospital bed;Toilet rise with handles    Recommendations for Other Services      Precautions / Restrictions Precautions Precautions: Fall;Other (comment) Precaution Comments: central line, chronic foley catheter, monitor O2 (does not wear at baseline) Restrictions Weight Bearing Restrictions: No       Mobility Bed Mobility               General bed mobility comments: sitting EOB on entry    Transfers Overall transfer level: Needs assistance Equipment used: Rolling walker (2 wheels) Transfers: Sit to/from Stand Sit to Stand: Min assist, From elevated surface           General transfer comment: Improved sit to stand abilities when able to use armrests with noted difficulty standing from bedside with regular height. based on reports of high bed at home, pt able to stand with Min A. Pt initially requesting BSC be placed in front of her to pull up on - educated on safety concerns with pt becoming irritated     Balance Overall balance  assessment: Needs assistance Sitting-balance support: No upper extremity supported, Feet supported Sitting balance-Leahy Scale: Good     Standing balance support:  Bilateral upper extremity supported, During functional activity, Reliant on assistive device for balance Standing balance-Leahy Scale: Poor                             ADL either performed or assessed with clinical judgement   ADL Overall ADL's : Needs assistance/impaired                     Lower Body Dressing: Supervision/safety;Sitting/lateral leans Lower Body Dressing Details (indicate cue type and reason): to don socks, assist with line mgmt (as pt reports they are in the way and she cant do anything with the lines present). able to cross L LE easily but unable to cross R LE, requriing pt to bend forward to this foot. Increased time, >7 min to complete task but able to do so without physical assist             Functional mobility during ADLs: Minimal assistance;Rolling walker (2 wheels) General ADL Comments: Focus on addressing LB ADL abilities with pt able to manage socks, declined bathing tasks. Attempted to simulate home environment (reports high bed at home, recliners with armrests she can push from) as pt difficulty with sit to stand transfers. Once up, pt able to mobilize without assist. Trialed on RA with pt denying SOB but SpO2 sats 78%. applied 1 L O2 and change of pulse ox with slow increase to 93% (hovering 83-86%) within 7 min    Extremity/Trunk Assessment Upper Extremity Assessment Upper Extremity Assessment: LUE deficits/detail LUE Deficits / Details: questionable shoulder flexion impairment. able to lift 45* approx, able to bring UE back to don gown around back LUE Coordination: decreased gross motor   Lower Extremity Assessment Lower Extremity Assessment: Defer to PT evaluation        Vision   Vision Assessment?: No apparent visual deficits   Perception     Praxis      Cognition Arousal/Alertness: Awake/alert Behavior During Therapy: Flat affect, Agitated Overall Cognitive Status: No family/caregiver present to determine baseline  cognitive functioning                                 General Comments: pt able to verbalize needs, but remains particular about how therapist completes each task. the pt was able to follow simple cues/commands. becomes frustrated when asked who could assist her at home, home setup (to simulate in this environment)        Exercises      Shoulder Instructions       General Comments SpO2 96% on 2 L O2, trialed on RA with gradual decrease to 90%. With 2 bouts of mobility in room on RA, desats to 78%, denies SOB. Applied 1 L O2 with increase to 93% > 7 min    Pertinent Vitals/ Pain       Pain Assessment Pain Assessment: No/denies pain  Home Living                                          Prior Functioning/Environment              Frequency  Min 2X/week  Progress Toward Goals  OT Goals(current goals can now be found in the care plan section)  Progress towards OT goals: Progressing toward goals  Acute Rehab OT Goals Patient Stated Goal: go home today OT Goal Formulation: With patient Time For Goal Achievement: 01/29/22 Potential to Achieve Goals: Good ADL Goals Pt Will Perform Lower Body Bathing: with mod assist;sit to/from stand Pt Will Perform Lower Body Dressing: with mod assist;sit to/from stand Pt Will Transfer to Toilet: with modified independence;ambulating Additional ADL Goal #1: Pt to demo bed mobility at [place] A in prep for ADL transfers  Plan Discharge plan remains appropriate    Co-evaluation                 AM-PAC OT "6 Clicks" Daily Activity     Outcome Measure   Help from another person eating meals?: A Little Help from another person taking care of personal grooming?: A Little Help from another person toileting, which includes using toliet, bedpan, or urinal?: Total Help from another person bathing (including washing, rinsing, drying)?: A Lot Help from another person to put on and taking off regular  upper body clothing?: A Little Help from another person to put on and taking off regular lower body clothing?: A Little 6 Click Score: 15    End of Session Equipment Utilized During Treatment: Rolling walker (2 wheels);Oxygen  OT Visit Diagnosis: Unsteadiness on feet (R26.81);Other abnormalities of gait and mobility (R26.89);Muscle weakness (generalized) (M62.81);Other symptoms and signs involving cognitive function   Activity Tolerance Patient tolerated treatment well   Patient Left in bed;with call bell/phone within reach;Other (comment) (MD at bedside)   Nurse Communication Mobility status        Time: [phone] OT Time Calculation (min): 39 min  Charges: OT General Charges $OT Visit: 1 Visit OT Treatments $Self Care/Home Management : 23-37 mins $Therapeutic Activity: 8-22 mins  Malachy Chamber, OTR/L Acute Rehab Services Office: [phone]   Layla Maw 01/17/2022, 10:55 AM

## 2022-01-17 NOTE — TOC Initial Note (Signed)
Transition of Care [facility]) - Initial/Assessment Note    Patient Details  Name: Patricia Wagner MRN: [id number] Date of Birth: Dec 08, 1950  Transition of Care [facility]) CM/SW Contact:    Erenest Rasher, RN Phone Number: [phone] 01/17/2022, 5:15 PM  Clinical Narrative:                 HF TOC CM spoke to pt and grandson, Wendall Stade at bedside. Pt states her friend Bruna Potter will be staying with her when she goes home. States she will transport via car home tomorrow. Pt will need oxygen. Message sent to [facility] for oxygen for home in am. Pt has bedside commode in room. Contacted [facility] rep, Stacie to make aware of dc home tomorrow.   Expected Discharge Plan: [place] Barriers to Discharge: Continued Medical Work up   Patient Goals and CMS Choice Patient states their goals for this hospitalization and ongoing recovery are:: return home CMS Medicare.gov Compare Post Acute Care list provided to:: Patient Choice offered to / list presented to : Patient  Expected Discharge Plan and Services Expected Discharge Plan: [place] In-house Referral: Clinical Social Work Discharge Planning Services: CM Consult Post Acute Care Choice: Durable Medical Equipment, Home Health Living arrangements for the past 2 months: Apartment                 DME Arranged: Oxygen DME Agency: [employer] Date DME Agency Contacted: 01/17/22 Time DME Agency Contacted: 1017 Representative spoke with at DME Agency: Freda Munro HH Arranged: RN, PT, OT, Nurse's Aide, Social Work [employer] Agency: [place] Date [place]: 01/15/22 Time [place]: 47 Representative spoke with at [place]: [place] Arrangements/Services Living arrangements for the past 2 months: [place] with:: Self Patient language and need for interpreter reviewed:: Yes Do you feel safe going back to the place where you live?: Yes      Need for  Family Participation in Patient Care: Yes (Comment) Care giver support system in place?: Yes (comment) Current home services:  (walker, rollator,) Criminal Activity/Legal Involvement Pertinent to Current Situation/Hospitalization: No - Comment as needed  Activities of Daily Living Home Assistive Devices/Equipment: Environmental consultant (specify type), Cane (specify quad or straight) ADL Screening (condition at time of admission) Patient's cognitive ability adequate to safely complete daily activities?: Yes Is the patient deaf or have difficulty hearing?: No Does the patient have difficulty seeing, even when wearing glasses/contacts?: No Does the patient have difficulty concentrating, remembering, or making decisions?: No Patient able to express need for assistance with ADLs?: Yes Does the patient have difficulty dressing or bathing?: No Independently performs ADLs?: Yes (appropriate for developmental age) Does the patient have difficulty walking or climbing stairs?: Yes Weakness of Legs: Both Weakness of Arms/Hands: None  Permission Sought/Granted Permission sought to share information with : Case Manager, Customer service manager Permission granted to share information with : Yes, Verbal Permission Granted  Share Information with NAME: Harrell Gave  Permission granted to share info w AGENCY: [facility], DME  Permission granted to share info w Relationship: daughter  Permission granted to share info w Contact Information: [phone]  Emotional Assessment Appearance:: Appears stated age Attitude/Demeanor/Rapport: Engaged Affect (typically observed): Appropriate Orientation: : Oriented to Self, Oriented to Place, Oriented to  Time, Oriented to Situation Alcohol / Substance Use: Not Applicable Psych Involvement: No (comment)  Admission diagnosis:  NSTEMI (non-ST elevated myocardial infarction) ([place]) [I21.4] Acute renal failure (ARF) (  [place]) [N17.9] Acute CHF (congestive heart failure)  ([place]) [I50.9] AKI (acute kidney injury) ([place]) [N17.9] Acute respiratory failure with hypoxia and hypercapnia ([place]) [J96.01, J96.02] Acute renal failure, unspecified acute renal failure type ([place]) [N17.9] Acute on chronic congestive heart failure, unspecified heart failure type ([place]) [I50.9] Patient Active Problem List   Diagnosis Date Noted   UTI (urinary tract infection) 01/09/2022   Acute CVA (cerebrovascular accident) ([place]) 01/06/2022   Slurred speech 12/31/2021   Leukocytosis 12/31/2021   LRTI (lower respiratory tract infection) 08/29/2021   Age-related osteoporosis without current pathological fracture 08/28/2021   Cough productive of purulent sputum 08/28/2021   CKD (chronic kidney disease) stage 3, GFR 30-59 ml/min (HCC)    Visit for screening mammogram 05/22/2021   Steroid dependence ([place]) 05/22/2021   Estrogen deficiency 05/22/2021   Encounter for general adult medical examination with abnormal findings 05/22/2021   Dyslipidemia, goal LDL below 130 05/22/2021   Primary osteoarthritis of right knee 03/20/2020   Persistent atrial fibrillation ([place]) 10/13/2019   Allergic rhinitis 09/10/2019   Vitamin D deficiency 04/15/2019   Acute renal failure superimposed on stage 3b chronic kidney disease ([place]) 09/81/1914   Chronic systolic (congestive) heart failure ([place]) 03/05/2018   Chronic atrial fibrillation 03/05/2018   Urinary retention 11/23/2017   Bradycardia 11/20/2016   Acute on chronic combined systolic and diastolic CHF (congestive heart failure) ([place]) 11/20/2016   Cerebrovascular accident (CVA) due to embolism of left cerebellar artery (HCC)    PAF (paroxysmal atrial fibrillation) (HCC)    CHF (congestive heart failure) ([place]) 10/31/2016   CAD in native artery, s/p cardiac cath with non obstructive CAD 10/24/2016   NICM (nonischemic cardiomyopathy) ([place]) 10/24/2016   Tobacco abuse 10/20/2016   Degenerative arthritis of left knee 03/15/2012   Encounter for well adult exam  with abnormal findings 03/12/2012   Hypersomnia    MENOPAUSAL DISORDER 01/09/2011   HLD (hyperlipidemia) 02/03/2008   Essential hypertension 07/22/2007   Raynaud's syndrome 07/22/2007   PCP:  Janith Lima, MD Pharmacy:   [id number] [address], [place] [address] Phone: [phone] Fax: [place] [address] [address] Phone: [phone] Fax: [phone]     Social Determinants of Health (SDOH) Interventions Food Insecurity Interventions: Intervention Not Indicated Financial Strain Interventions: Intervention Not Indicated Housing Interventions: Intervention Not Indicated Transportation Interventions: Intervention Not Indicated  Readmission Risk Interventions No flowsheet data found.

## 2022-01-17 NOTE — TOC CM/SW Note (Addendum)
SATURATION QUALIFICATIONS: (This note is used to comply with regulatory documentation for home oxygen)  Patient Saturations on Room Air at Rest = 85%  Patient Saturations on 2 Liters of oxygen while Ambulating = 91%  Please briefly explain why patient needs home oxygen: Heart Failure Jonnie Finner RN3 CCM, Heart Failure TOC CM [phone]

## 2022-01-17 NOTE — Progress Notes (Signed)
Patient ID: Patricia Wagner, female   DOB: 04/10/1951, 71 y.o.   MRN: [id number] S: Feels better and wants to go home. O:BP 135/73    Pulse 71    Temp 98.2 F (36.8 C) (Oral)    Resp 18    Ht 5\' 4"  (1.626 m)    Wt 99.1 kg    SpO2 98%    BMI 37.50 kg/m   Intake/Output Summary (Last 24 hours) at 01/17/2022 1058 Last data filed at 01/17/2022 0819 Gross per 24 hour  Intake 630 ml  Output 3351 ml  Net -2721 ml   Intake/Output: I/O last 3 completed shifts: In: 870 [P.O.:870] Out: 5151 [Urine:5150; Stool:1]  Intake/Output this shift:  Total I/O In: 240 [P.O.:240] Out: -  Weight change: -3.9 kg Gen:NAD CVS: RRR Resp:CTA Abd: +BS, soft, NT/ND Ext: 1+ edema of b/l lower ext with UNNA wraps in place  Recent Labs  Lab 01/11/22 0440 01/12/22 0513 01/13/22 0338 01/14/22 0257 01/15/22 0352 01/16/22 0425 01/17/22 0508  NA 142 142 141 143 140 140 140  K 4.1 3.3* 3.2* 3.6 3.3* 3.4* 3.3*  CL 98 96* 95* 96* 91* 90* 88*  CO2 32 35* 33* 35* 37* 39* 41*  GLUCOSE 90 92 90 87 92 91 89  BUN 115* 111* 107* 100* 95* 87* 78*  CREATININE 3.94* 3.57* 3.56* 3.22* 3.05* 3.01* 2.79*  ALBUMIN 2.7* 2.6* 2.6*  --   --   --  2.8*  CALCIUM 8.6* 8.3* 8.5* 8.5* 8.5* 8.6* 8.7*  PHOS 4.6 4.0 3.2  --   --   --  2.3*   Liver Function Tests: Recent Labs  Lab 01/12/22 0513 01/13/22 0338 01/17/22 0508  ALBUMIN 2.6* 2.6* 2.8*   No results for input(s): LIPASE, AMYLASE in the last 168 hours. No results for input(s): AMMONIA in the last 168 hours. CBC: Recent Labs  Lab 01/11/22 0431 01/12/22 0513 01/13/22 0338 01/14/22 0257 01/15/22 0352  WBC 9.3 8.6 10.2 8.3 5.6  HGB 10.7* 10.1* 9.7* 9.9* 9.3*  HCT 35.2* 34.1* 32.7* 32.7* 31.2*  MCV 91.0 91.7 91.3 90.8 89.7  PLT 192 187 205 215 199   Cardiac Enzymes: No results for input(s): CKTOTAL, CKMB, CKMBINDEX, TROPONINI in the last 168 hours. CBG: No results for input(s): GLUCAP in the last 168 hours.  Iron Studies: No results for input(s): IRON, TIBC,  TRANSFERRIN, FERRITIN in the last 72 hours. Studies/Results: No results found.  apixaban  5 mg Oral BID   atorvastatin  20 mg Oral Daily   chlorhexidine  15 mL Mouth Rinse BID   Chlorhexidine Gluconate Cloth  6 each Topical Daily   Gerhardt's butt cream   Topical Daily   mouth rinse  15 mL Mouth Rinse q12n4p   mexiletine  300 mg Oral Q12H   midodrine  10 mg Oral TID WC   pantoprazole  40 mg Oral Daily   potassium chloride  40 mEq Oral Once   sodium chloride flush  10-40 mL Intracatheter Q12H   torsemide  80 mg Oral BID    BMET    Component Value Date/Time   NA 140 01/17/2022 0508   NA 139 04/17/2021 1608   K 3.3 (L) 01/17/2022 0508   CL 88 (L) 01/17/2022 0508   CO2 41 (H) 01/17/2022 0508   GLUCOSE 89 01/17/2022 0508   BUN 78 (H) 01/17/2022 0508   BUN 34 (H) 04/17/2021 1608   CREATININE 2.79 (H) 01/17/2022 0508   CREATININE 1.78 (H) 10/15/2021 1508  CALCIUM 8.7 (L) 01/17/2022 0508   GFRNONAA 18 (L) 01/17/2022 0508   GFRNONAA 40 (L) 02/01/2021 1524   GFRAA 46 (L) 02/01/2021 1524   CBC    Component Value Date/Time   WBC 5.6 01/15/2022 0352   RBC 3.48 (L) 01/15/2022 0352   HGB 9.3 (L) 01/15/2022 0352   HGB 12.5 04/17/2021 1608   HCT 31.2 (L) 01/15/2022 0352   HCT 39.6 04/17/2021 1608   PLT 199 01/15/2022 0352   PLT 202 04/17/2021 1608   MCV 89.7 01/15/2022 0352   MCV 88 04/17/2021 1608   MCH 26.7 01/15/2022 0352   MCHC 29.8 (L) 01/15/2022 0352   RDW 15.5 01/15/2022 0352   RDW 13.1 04/17/2021 1608   LYMPHSABS 0.3 (L) 01/03/2022 1637   LYMPHSABS 0.8 04/17/2021 1608   MONOABS 1.1 (H) 01/03/2022 1637   EOSABS 0.0 01/03/2022 1637   EOSABS 0.0 04/17/2021 1608   BASOSABS 0.0 01/03/2022 1637   BASOSABS 0.0 04/17/2021 1608    Assessment/Plan:   AKI/CKD stage IIIb, non-oliguric - due to cardiorenal syndrome and ischemic ATN in setting of hypotension, acute biventricular CHF and concomitant ARB therapy.  Baseline Scr 1.4-1.6 and normally follows with Dr. Justin Mend in our  office. Peak Scr 5.25 but improved to 2.79 today with diuresis and holding ARB and SGLT-2 inhibitor.   She does not want dialysis, but thankfully she has slowly been improving. Continue to hold ARB and SGLT-2 inhibitor Nothing further to add.  Will sign off.  Please call with questions or concerns Will arrange for follow up with Dr. Justin Mend after discharge home Acute on chronic biventricular CHF with predominant RV failure - currently on midodrine 10 mg tid, UNNA boots, and torsemide 80 mg bid.  Improving volume status. Acute CVA - CT scan of head on 12/30/21 with old left cerebellar infarct, MRI on 01/05/22 revealed acute bilateral cerebral and right cerebellar infarcts suggestive of embolic source.  Neuro evaluated and felt to be due to atrial fibrillation.  Now on anticoagulation. Thrombocytopenia - improved off heparin.   Paroxysmal atrial fibrillation - stable, on eliquis. UTI - Klebsiella and Serratia, treated with ceftriaxone. Hypotension, chronic - on midodrine 10 mg tid Anemia of acute illness.  stable Acute hypoxic and hypercapnic respiratory failure - improved with diuresis.     Donetta Potts, MD [employer] [phone]

## 2022-01-17 NOTE — Discharge Summary (Addendum)
Physician Discharge Summary   Patient: Patricia Wagner MRN: [id number] DOB: 1950-12-23  Admit date:     12/30/2021  Discharge date: 01/18/22  Discharge Physician: Jimmy Picket Kannan Proia   PCP: Janith Lima, MD   Recommendations at discharge:    Patient will continue diuresis with torsemide Follow up renal function in 7 days Patient placed on apixaban for anticoagulation  Midodrine for blood pressure support.   Discharge Diagnoses: Principal Problem:   Acute on chronic combined systolic and diastolic CHF (congestive heart failure) (HCC) Active Problems:   CAD in native artery, s/p cardiac cath with non obstructive CAD   PAF (paroxysmal atrial fibrillation) (HCC)   Acute renal failure superimposed on stage 3b chronic kidney disease ([place])   Acute CVA (cerebrovascular accident) ([place])   UTI (urinary tract infection)  Resolved Problems:   * No resolved hospital problems. [facility] Course: Patricia Wagner was admitted to the hospital with the working diagnosis of decompensated heart failure.   71 yo female with the past medical history of atrial fibrillation, CKD stage 3b, systolic heart failure, CVA, COPD and hypertension who presented with 5 days of diarrhea and pre syncope episode. Patient was diagnosed with hypoxemia, but declined to be transported to the ED. The following day patient developed slurred speech and right arm weakness. On her initial physical examination her 02 saturation was 70 to 80%, and she was placed on Bipap, BP 95/58, HR 79, RR 18. She was somnolent but able to arouse, lungs with bilateral rales, heart with S1 and S2 present and rhythmic, abdomen soft and positive lower extremity edema.   Na 136, K 4.5, CL 104, bicarbonate 16, glucose 97, bun 128, cr 4,95 Anion gap 16.  pH 7.13, Pc02 62.3, Pa02 79, bicarb 21 BNP > 4,500  High sensitive troponin 5,961  Wbc 12.9, hgb 12,8, ct 41,9 and plt 98  Sars covid 19 negative   Urine with sg 1,013 with >300 protein, >50 wbc   Toxicology negative  Head Ct with old left cerebellar infarct.   Chest radiograph with cardiomegaly and bilateral hilar vascular congestion.  EKG with 87 bpm, right axis deviation, normal intervals, right atrial enlargement, sinus rhythm with PVC, poor R wave progression no significant ST segment changes, or T wave changes.   Patient was placed on a bicarbonate infusion and she was admitted to the ICU.  She was placed on non invasive mechanical ventilation, and received diuresis with furosemide.  Antibiotic therapy for urine infection, (related with chronic indwelling foley catheter).  She was placed on heparin for NSTEMI, peak troponin 5916  02/09 hemodialysis cathter was placed in preparation for renal replacement therapy.   Hospitalization complicated with embolic CVA.   02/12 brain MRI with scattered punctate acute bilateral cerebral and right cerebellar infarcts suggesting a central embolic source.   Patient developed worsening renal function and nephrology was consulted.  Diuretic therapy has been adjusted with improvement in volume status.  Patient has declined SNF and she is not candidate for inpatient rehab. Plan to discharge home with home health services.   Assessment and Plan: * Acute on chronic combined systolic and diastolic CHF (congestive heart failure) ([place])- (present on admission) Mild to moderate mitral regurgitation Acute hypoxic hypercapnic respiratory failure Patient had a prolonged hospitalization, she was admitted to the intensive care unit. Patient was placed on furosemide for diuresis and able to be liberated from non invasive mechanical ventilation.   Further work up with echocardiogram 12/31/2021 has demonstrated EF 45-50%,  severe RV enlargement with dysfunction   Patient required midodrine for blood pressure support and eventually transitioned to oral torsemide for diuresis. She will need close follow up as outpatient.  Not able to start on guideline  directed therapy for heart failure due to hypotension and risk of worsening rena function.   CAD in native artery, s/p cardiac cath with non obstructive CAD- (present on admission) NSTEMI CAD in native artery, s/p cardiac cath with non obstructive CAD- (present on admission)  Patient was treated medically with IV heparin No further chest pain.   UTI (urinary tract infection)- (present on admission) Related to chronic indwelling foley cathter.  -Urine culture has grown out Klebsiella pneumoniae and Serratia Marcescens. Both are sensitive to Rocephin, completed course Continue change foley evert 4 weeks.   Acute CVA (cerebrovascular accident) [facility]) On admission the patient was found to have dysarthria.  MRI brain noted scattered tine acute infarcts bilaterally consistent with embolic stroke.  Carotid duplex noted minimal plaque in the extracranial vessels.  Neurology was consulted, with recommendations to continue apixaban for secondary prophylaxis, and aggressive risk factors modification.   Plan to continue blood pressure support with midodrine, anticoagulation with apixaban and low dose statin therapy.   Acute renal failure superimposed on stage 3b chronic kidney disease ([place])- (present on admission) Hypokalemia, hypomagnesemia.  Patient responded to diuretic therapy along with supportive medical therapy. Acute cardiorenal syndrome and ischemic acute tubular necrosis in the setting of hypotension and acute biventricular heart failure and concomitant ARB therapy.   She did not required renal replacement therapy and HD cathter was removed.   At her discharge patient will continue diuresis with torsemide and will need close follow up as outpatient.   At her discharge her renal function had a serum cr od 2,79 with K at 3,3 and serum bicarbonate at 41.   Anemia of chronic renal disease. hgb is 9,3 and hct 31.  Transitory thrombocytopenia resolved.    PAF (paroxysmal atrial  fibrillation) ([place])- (present on admission) Status post ablation in 2020 and amiodarone was stopped.    Her heart reate remained well controlled, she has been placed on apixaban for anticoagulation with good toleration.   H/O urinary retention-resolved as of 01/17/2022 Chronic Foley, last changed in the end of January, will change this today  Acute renal failure (ARF) (HCC)-resolved as of 01/17/2022 AKI on CKD 3b Cardiorenal syndrome w/ ischemic ATN secondary to diuretics Lasix Entresto Jardiance etc. -Creatinine peaked at 5.4 -Renal ultrasound negative for hydronephrosis -Diuresed with IV Lasix and albumin, now on torsemide -Nephrology following, seen by palliative care as well, wishes for full code and full scope of treatment -See discussion above, kidney function stabilizing, HD catheter removed  Thrombocytopenia (HCC)-resolved as of 01/17/2022 -Platelet count dropped to 55 K, now normal, likely related to critical illness           Consultants: nephrology and neurology  Procedures performed: HD catheter   Disposition: Home patient declined SNF  Diet recommendation:  Cardiac diet  DISCHARGE MEDICATION: Allergies as of 01/17/2022       Reactions   Ace Inhibitors Palpitations        Medication List     STOP taking these medications    Entresto 24-26 MG Generic drug: sacubitril-valsartan   furosemide 80 MG tablet Commonly known as: LASIX   Jardiance 10 MG Tabs tablet Generic drug: empagliflozin   leflunomide 20 MG tablet Commonly known as: ARAVA   Potassium Chloride ER 20 MEQ Tbcr  TAKE these medications    apixaban 5 MG Tabs tablet Commonly known as: ELIQUIS Take 1 tablet (5 mg total) by mouth 2 (two) times daily.   atorvastatin 20 MG tablet Commonly known as: LIPITOR Take 1 tablet (20 mg total) by mouth daily. Start taking on: January 18, 2022   mexiletine 200 MG capsule Commonly known as: MEXITIL Take 200 mg by mouth 2 (two) times  daily.   midodrine 10 MG tablet Commonly known as: PROAMATINE Take 1 tablet (10 mg total) by mouth 3 (three) times daily with meals.   predniSONE 5 MG tablet Commonly known as: DELTASONE Take 1 tablet (5 mg total) by mouth daily with breakfast. What changed: additional instructions   rosuvastatin 10 MG tablet Commonly known as: CRESTOR TAKE 1 TABLET BY MOUTH EVERY DAY   Torsemide 40 MG Tabs Take 80 mg by mouth daily.   TYLENOL PO Take 1 tablet by mouth every 6 (six) hours as needed (headache/pain).               Durable Medical Equipment  (From admission, onward)           Start     Ordered   01/15/22 1656  For home use only DME 3 n 1  Once        01/15/22 1655            Follow-up Information     [facility] Follow up on 01/28/2022.   Specialty: Cardiology Why: [facility] at [facility] 3:30 pm Entrance C, Garage Code 1497 Contact information: [address] [id number] [place] [address] [place], [place] Follow up.   Specialty: Home Health Services Why: HHRN,HHPT,HHOT, HHAIDE, Social worker- Agency willl contact with apt times. Contact information: [address] [place] [address] [phone]         [facility] Follow up.   Why: 3 n 1 Contact information: [address] [place] [address] [phone]         [facility] .   Contact information: [address] [place] [address] [phone]                 Discharge Exam: Filed Weights   01/15/22 0400 01/16/22 0540 01/17/22 0300  Weight: 105 kg 103 kg 99.1 kg   BP 128/74 (BP Location: Left Arm)    Pulse 80    Temp 98.6 F (37 C) (Oral)    Resp 18    Ht 5\' 4"  (1.626 m)    Wt 99.1 kg    SpO2 98%    BMI 37.50 kg/m   Neurology awake and alert ENT with mild pallor Cardiovascular with S1 and S2  present, with no gallops or rubs.  No JVD Trace lower extremity edema, unna boots in place Respiratory with no wheezing or rhnochi Abdomen soft and non distended.   Condition at discharge: stable  The results of significant diagnostics from this hospitalization (including imaging, microbiology, ancillary and laboratory) are listed below for reference.   Imaging Studies: DG Chest 2 View  Result Date: 12/30/2021 CLINICAL DATA:  Altered mental status, slurred speech EXAM: CHEST - 2 VIEW COMPARISON:  08/28/2021 FINDINGS: Cardiomegaly. Vascular congestion. Aortic atherosclerosis. No confluent opacity, effusion or edema. No acute bony abnormality. IMPRESSION: Cardiomegaly, vascular congestion. Electronically Signed   By: Rolm Baptise M.D.  On: 12/30/2021 23:25   CT HEAD WO CONTRAST  Result Date: 12/30/2021 CLINICAL DATA:  Neuro deficit, acute, stroke suspected dysarthria, weakness EXAM: CT HEAD WITHOUT CONTRAST TECHNIQUE: Contiguous axial images were obtained from the base of the skull through the vertex without intravenous contrast. RADIATION DOSE REDUCTION: This exam was performed according to the departmental dose-optimization program which includes automated exposure control, adjustment of the mA and/or kV according to patient size and/or use of iterative reconstruction technique. COMPARISON:  08/05/2018 FINDINGS: Brain: Old left cerebellar infarct. No acute intracranial abnormality. Specifically, no hemorrhage, hydrocephalus, mass lesion, acute infarction, or significant intracranial injury. Vascular: No hyperdense vessel or unexpected calcification. Skull: No acute calvarial abnormality. Sinuses/Orbits: No acute findings Other: None IMPRESSION: Old left cerebellar infarct. No acute intracranial abnormality. Electronically Signed   By: Rolm Baptise M.D.   On: 12/30/2021 23:26   MR ANGIO HEAD WO CONTRAST  Result Date: 01/07/2022 CLINICAL DATA:  71 year old female with widely scattered punctate  infarcts in the cerebral hemispheres on recent MRI suspicious for embolic event. EXAM: MRA HEAD WITHOUT CONTRAST TECHNIQUE: Angiographic images of the Circle of Willis were acquired using MRA technique without intravenous contrast. COMPARISON:  Brain MRI 01/05/2022. Neck MRA today reported separately. CTA head and neck 11/20/2016. FINDINGS: Anterior circulation: Antegrade flow in the ICA siphons. Bilateral siphon tortuosity and irregularity in keeping with atherosclerosis, but no significant siphon stenosis. Patent carotid termini, MCA and ACA origins. Normal anterior communicating artery. Visible ACA branches appear stable and within normal limits. MCA M1 segments and MCA bi/trifurcations are patent without stenosis. Visible bilateral MCA branches appear stable and within normal limits. Posterior circulation: Antegrade flow in the posterior fossa with chronically dominant distal right vertebral artery. Mild distal vertebral artery tortuosity with no distal vertebral stenosis. Patent vertebrobasilar junction. Tortuous basilar artery without stenosis. Patent bilateral AICA, SCA and PCA origins. Tortuous P1 segments with diminutive or absent posterior communicating arteries. Bilateral PCA branches are within normal limits. Anatomic variants: Dominant right vertebral artery. Other: Left cerebellar encephalomalacia. No intracranial mass effect or ventriculomegaly. IMPRESSION: No intracranial large vessel occlusion or hemodynamically significant stenosis. Generalized arterial tortuosity and evidence of ICA siphon atherosclerosis. Electronically Signed   By: Genevie Ann M.D.   On: 01/07/2022 06:52   MR ANGIO NECK WO CONTRAST  Result Date: 01/07/2022 CLINICAL DATA:  71 year old female with widely scattered punctate infarcts in the cerebral hemispheres on recent MRI suspicious for embolic event. EXAM: MRA NECK WITHOUT CONTRAST TECHNIQUE: Angiographic images of the neck were acquired using MRA technique without intravenous  contrast. Carotid stenosis measurements (when applicable) are obtained utilizing NASCET criteria, using the distal internal carotid diameter as the denominator. COMPARISON:  Brain MRI 01/05/2022.  CTA head and neck 01/22/2016. FINDINGS: 2D and 3D time-of-flight neck MRA. Antegrade flow in the bilateral Common carotid and internal carotid arteries, which have a partially retropharyngeal course bilaterally. Continued antegrade flow in both ICAs to the skull base. Motion artifact at the carotid bifurcations on 2D time-of-flight. No evidence of hemodynamically significant cervical ICA stenosis on 3D time-of-flight images. Antegrade flow in dominant appearing right vertebral artery with no evidence of stenosis. Absent antegrade flow signal in the cervical left vertebral artery, while in 2017 there was no left vertebral artery enhancement until the V3 segment. And once again, the distal left vertebral artery appears to be reconstituted from muscular/ECA branches at the V3 level. Subsequently, both distal vertebral arteries demonstrate antegrade flow signal to the vertebrobasilar junction. IMPRESSION: 1. Partially retropharyngeal carotids with no evidence of  stenosis in the neck. 2. Dominant right vertebral artery without stenosis. 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 Electronically Signed   By: Genevie Ann M.D.   On: 01/07/2022 07:04   MR BRAIN WO CONTRAST  Result Date: 01/05/2022 CLINICAL DATA:  Neuro deficit, acute, stroke suspected. EXAM: MRI HEAD WITHOUT CONTRAST TECHNIQUE: Multiplanar, multiecho pulse sequences of the brain and surrounding structures were obtained without intravenous contrast. COMPARISON:  Head CT 12/30/2021 and MRI 08/06/2018 FINDINGS: Brain: Scattered punctate acute cortical and subcortical infarcts are present in the frontal lobes, parietal lobes, and superior right occipital lobe. There is also a single  punctate acute infarct in the right cerebellar hemisphere. A chronic superior left cerebellar infarct is again noted with associated chronic blood products. There is also a small chronic infarct in the superomedial aspect of the right occipital lobe. Scattered small T2 hyperintensities in the cerebral white matter bilaterally have mildly progressed from the prior MRI and are nonspecific but compatible with mild chronic small vessel ischemic disease. The ventricles are normal in size. No mass, midline shift, or extra-axial fluid collection is identified. A partially empty sella is unchanged from the prior MRI. Vascular: Major intracranial vascular flow voids are preserved. Skull and upper cervical spine: Unremarkable bone marrow signal. Sinuses/Orbits: Unremarkable orbits. Small mucous retention cyst in a left ethmoid air cell. Trace left mastoid fluid. Other: Unchanged small Tornwaldt cyst. IMPRESSION: 1. Scattered punctate acute bilateral cerebral and right cerebellar infarcts suggesting a central embolic source. 2. Mild chronic small vessel ischemic disease with chronic infarcts as above. Electronically Signed   By: Logan Bores M.D.   On: 01/05/2022 18:34   US RENAL  Result Date: 12/31/2021 CLINICAL DATA:  71 year old female with history of acute kidney injury. EXAM: RENAL / URINARY TRACT ULTRASOUND COMPLETE COMPARISON:  Abdominal ultrasound 08/06/2018. FINDINGS: Right Kidney: Renal measurements: 9.5 x 5.8 x 6.1 cm = volume: 173.2 mL. Echogenicity within normal limits. No mass or hydronephrosis visualized. Left Kidney: Renal measurements: 10.2 x 6.0 x 5.7 cm = volume: 181.8 mL. Echogenicity within normal limits. In the lower pole of the left kidney there is a well-circumscribed 3.0 x 4.0 x 3.1 cm anechoic lesion with increased through transmission, compatible with a simple cyst. No solid renal mass noted. No hydronephrosis visualized. Bladder: Could not be visualized secondary to the patient's large body  habitus. Other: None. IMPRESSION: 1. No acute findings.  Specifically, no hydronephrosis. 2. Simple cyst in the lower pole of the left kidney again noted. 3. Limited study which was unable to visualize the urinary bladder. Electronically Signed   By: Vinnie Langton M.D.   On: 12/31/2021 07:27   DG CHEST PORT 1 VIEW  Result Date: 01/02/2022 CLINICAL DATA:  Acute hemodialysis EXAM: PORTABLE CHEST 1 VIEW COMPARISON:  12/30/2021 FINDINGS: Cardiomegaly. No frank interstitial edema. No pleural effusion or pneumothorax. Right IJ dual lumen dialysis catheter terminates in the lower SVC. Degenerative changes of the thoracic spine. IMPRESSION: Cardiomegaly.  No frank interstitial edema. Electronically Signed   By: Julian Hy M.D.   On: 01/02/2022 19:11   ECHOCARDIOGRAM COMPLETE  Result Date: 12/31/2021    ECHOCARDIOGRAM REPORT   Patient Name:   LUANNA WEESNER Date of Exam: 12/31/2021 Medical Rec #:  [id number]    Height:       64.0 in Accession #:    [id number]   Weight:       218.0 lb Date of Birth:  1950-12-17    BSA:          2.029 m Patient Age:    29 years     BP:           99/62 mmHg Patient Gender: F            HR:           70 bpm. Exam Location:  Inpatient Procedure: 2D Echo Indications:    Acute systolic CHF  History:        Patient has prior history of Echocardiogram examinations, most                 recent 08/12/2021. CHF, CAD, Arrythmias:Atrial Fibrillation and                 Bradycardia; Risk Factors:Hypertension.  Sonographer:    Arlyss Gandy Referring Phys: [id number] RATHORE  Sonographer Comments: Image acquisition challenging due to patient body habitus and supine. IMPRESSIONS  1. Left ventricular ejection fraction, by estimation, is 45 to 50%. The left ventricle has mildly decreased function. The left ventricle demonstrates global hypokinesis. Left ventricular diastolic function could not be evaluated.  2. Right ventricular systolic function is moderately reduced. The right ventricular  size is severely enlarged. There is moderately elevated pulmonary artery systolic pressure.  3. Left atrial size was mildly dilated.  4. Right atrial size was severely dilated.  5. The mitral valve is normal in structure. No evidence of mitral valve regurgitation.  6. Tricuspid valve regurgitation is moderate to severe.  7. The aortic valve is tricuspid. There is mild calcification of the aortic valve. There is mild thickening of the aortic valve. Aortic valve regurgitation is mild. Aortic valve sclerosis/calcification is present, without any evidence of aortic stenosis. Comparison(s): A prior study was performed on 08/12/2021. The left ventricular function has improved. The left ventricle is also less dilated. The improvement is noted despite the fact that the previous study showed sinus rhythm and now the patient is in  atrial fibrillation. Conclusion(s)/Recommendation(s): The background rhythm is atrial fibrillation with occasional ventricular-paced beats. During ventricular pacing there is marked dyssynchrony, lower LVEF and the tricuspid insufficiency appears worse. FINDINGS  Left Ventricle: Left ventricular ejection fraction, by estimation, is 45 to 50%. The left ventricle has mildly decreased function. The left ventricle demonstrates global hypokinesis. The left ventricular internal cavity size was normal in size. There is  no left ventricular hypertrophy. Left ventricular diastolic function could not be evaluated due to atrial fibrillation. Left ventricular diastolic function could not be evaluated. Right Ventricle: The right ventricular size is severely enlarged. No increase in right ventricular wall thickness. Right ventricular systolic function is moderately reduced. There is moderately elevated pulmonary artery systolic pressure. The tricuspid regurgitant velocity is 3.23 m/s, and with an assumed right atrial pressure of 15 mmHg, the estimated right ventricular systolic pressure is 14.7 mmHg. Left  Atrium: Left atrial size was mildly dilated. Right Atrium: Right atrial size was severely dilated. Pericardium: There is no evidence of pericardial effusion. Mitral Valve: The mitral valve is normal in structure. Mild mitral annular calcification. No evidence of mitral valve regurgitation. Tricuspid Valve: The tricuspid valve is normal in structure. Tricuspid valve regurgitation is moderate to severe. Aortic Valve: The aortic valve is tricuspid. There is mild calcification of the aortic valve. There is mild thickening of the aortic valve. Aortic valve regurgitation is mild. Aortic valve sclerosis/calcification is present, without any evidence of aortic stenosis. Aortic valve mean gradient measures 10.5 mmHg.  Aortic valve peak gradient measures 18.4 mmHg. Aortic valve area, by VTI measures 1.44 cm. Pulmonic Valve: The pulmonic valve was grossly normal. Pulmonic valve regurgitation is not visualized. Aorta: The aortic root is normal in size and structure. IAS/Shunts: No atrial level shunt detected by color flow Doppler.  LEFT VENTRICLE PLAX 2D LVIDd:         5.30 cm   Diastology LVIDs:         3.80 cm   LV e' lateral:   8.05 cm/s LV PW:         1.10 cm   LV E/e' lateral: 10.5 LV IVS:        1.10 cm LVOT diam:     2.20 cm LV SV:         50 LV SV Index:   25 LVOT Area:     3.80 cm  RIGHT VENTRICLE            IVC RV Basal diam:  5.50 cm    IVC diam: 2.70 cm RV Mid diam:    4.90 cm RV S prime:     9.90 cm/s TAPSE (M-mode): 1.5 cm LEFT ATRIUM             Index        RIGHT ATRIUM           Index LA diam:        4.30 cm 2.12 cm/m   RA Area:     40.70 cm LA Vol (A2C):   63.6 ml 31.34 ml/m  RA Volume:   167.00 ml 82.29 ml/m LA Vol (A4C):   65.9 ml 32.47 ml/m LA Biplane Vol: 65.0 ml 32.03 ml/m  AORTIC VALVE AV Area (Vmax):    1.64 cm AV Area (Vmean):   1.44 cm AV Area (VTI):     1.44 cm AV Vmax:           214.50 cm/s AV Vmean:          152.000 cm/s AV VTI:            0.346 m AV Peak Grad:      18.4 mmHg AV Mean  Grad:      10.5 mmHg LVOT Vmax:         92.70 cm/s LVOT Vmean:        57.600 cm/s LVOT VTI:          0.131 m LVOT/AV VTI ratio: 0.38  AORTA Ao Root diam: 3.00 cm MITRAL VALVE               TRICUSPID VALVE MV Area (PHT): 2.66 cm    TR Peak grad:   41.7 mmHg MV Decel Time: 285 msec    TR Vmax:        323.00 cm/s MV E velocity: 84.80 cm/s                            SHUNTS                            Systemic VTI:  0.13 m                            Systemic Diam: 2.20 cm Dani Gobble Croitoru MD Electronically signed by Sanda Klein MD Signature Date/Time: 12/31/2021/3:32:07 PM    Final    VAS US CAROTID  Result Date: 01/07/2022 Carotid Arterial Duplex Study Patient Name:  MERISA JULIO  Date of Exam:   01/06/2022 Medical Rec #: [id number]     Accession #:    [id number] Date of Birth: 05-May-1951     Patient Gender: F Patient Age:   33 years Exam Location:  [name] Procedure:      VAS US CAROTID Referring Phys: Karie [place] --------------------------------------------------------------------------------  Indications:       Carotid artery disease. Risk Factors:      Hypertension, hyperlipidemia, past history of smoking,                    coronary artery disease. Limitations        Today's exam was limited due to Line in right neck with                    bandaging. Comparison Study:  No prior study Performing Technologist: Maudry Mayhew MHA, RDMS, RVT, RDCS  Examination Guidelines: A complete evaluation includes B-mode imaging, spectral Doppler, color Doppler, and power Doppler as needed of all accessible portions of each vessel. Bilateral testing is considered an integral part of a complete examination. Limited examinations for reoccurring indications may be performed as noted.  Right Carotid Findings: +----------+--------+--------+--------+------------------+--------+             PSV cm/s EDV cm/s Stenosis Plaque Description Comments  +----------+--------+--------+--------+------------------+--------+  CCA Prox    82       17                                             +----------+--------+--------+--------+------------------+--------+  CCA Distal 67       14                                             +----------+--------+--------+--------+------------------+--------+  ICA Prox   68       19                                             +----------+--------+--------+--------+------------------+--------+  ICA Distal 22       8                                              +----------+--------+--------+--------+------------------+--------+  ECA        88       14                                             +----------+--------+--------+--------+------------------+--------+ +----------+--------+-------+----------------+-------------------+             PSV cm/s EDV cms Describe         Arm Pressure (mmHG)  +----------+--------+-------+----------------+-------------------+  Subclavian                  Multiphasic, WNL                      +----------+--------+-------+----------------+-------------------+ +---------+--------+--+--------+--+---------+  Vertebral PSV cm/s 67 EDV cm/s 18 Antegrade  +---------+--------+--+--------+--+---------+  Left Carotid Findings: +----------+--------+--------+--------+--------------------------+--------+             PSV cm/s EDV cm/s Stenosis Plaque Description         Comments  +----------+--------+--------+--------+--------------------------+--------+  CCA Prox   101      29                                                     +----------+--------+--------+--------+--------------------------+--------+  CCA Distal 97       20                                                     +----------+--------+--------+--------+--------------------------+--------+  ICA Prox   89       23                heterogenous and irregular           +----------+--------+--------+--------+--------------------------+--------+  ICA Distal 100      20                                                      +----------+--------+--------+--------+--------------------------+--------+  ECA        95       9                                                      +----------+--------+--------+--------+--------------------------+--------+ +----------+--------+--------+----------------+-------------------+             PSV cm/s EDV cm/s Describe         Arm Pressure (mmHG)  +----------+--------+--------+----------------+-------------------+  Subclavian 127               Multiphasic, WNL                      +----------+--------+--------+----------------+-------------------+ +---------+--------+--+--------+-+---------+  Vertebral PSV cm/s 22 EDV cm/s 7 Antegrade  +---------+--------+--+--------+-+---------+   Summary: Right Carotid: The extracranial vessels were near-normal with only minimal wall                thickening or plaque. Left Carotid: The extracranial vessels were near-normal with only minimal wall               thickening or plaque. Vertebrals:  Bilateral vertebral arteries demonstrate antegrade flow. Subclavians: Right subclavian artery was not visualized. Normal flow              hemodynamics were seen in the left subclavian artery. *See table(s) above for measurements and observations.  Electronically signed by Deitra Mayo MD on 01/07/2022 at 7:52:35 AM.    Final     Microbiology: Results for orders placed or performed during the hospital encounter of 12/30/21  Resp Panel by RT-PCR (Flu A&B, Covid) Nasopharyngeal Swab     Status: None   Collection Time: 12/30/21 11:42 PM   Specimen: Nasopharyngeal  Swab; Nasopharyngeal(NP) swabs in vial transport medium  Result Value Ref Range Status   SARS Coronavirus 2 by RT PCR NEGATIVE NEGATIVE Final    Comment: (NOTE) SARS-CoV-2 target nucleic acids are NOT DETECTED.  The SARS-CoV-2 RNA is generally detectable in upper respiratory specimens during the acute phase of infection. The lowest concentration of SARS-CoV-2 viral copies this assay can detect  is 138 copies/mL. A negative result does not preclude SARS-Cov-2 infection and should not be used as the sole basis for treatment or other patient management decisions. A negative result may occur with  improper specimen collection/handling, submission of specimen other than nasopharyngeal swab, presence of viral mutation(s) within the areas targeted by this assay, and inadequate number of viral copies(<138 copies/mL). A negative result must be combined with clinical observations, patient history, and epidemiological information. The expected result is Negative.  Fact Sheet for Patients:  EntrepreneurPulse.com.au  Fact Sheet for Healthcare Providers:  IncredibleEmployment.be  This test is no t yet approved or cleared by the [place] FDA and  has been authorized for detection and/or diagnosis of SARS-CoV-2 by FDA under an Emergency Use Authorization (EUA). This EUA will remain  in effect (meaning this test can be used) for the duration of the COVID-19 declaration under Section 564(b)(1) of the Act, 21 U.S.C.section 360bbb-3(b)(1), unless the authorization is terminated  or revoked sooner.       Influenza A by PCR NEGATIVE NEGATIVE Final   Influenza B by PCR NEGATIVE NEGATIVE Final    Comment: (NOTE) The Xpert Xpress SARS-CoV-2/FLU/RSV plus assay is intended as an aid in the diagnosis of influenza from Nasopharyngeal swab specimens and should not be used as a sole basis for treatment. Nasal washings and aspirates are unacceptable for Xpert Xpress SARS-CoV-2/FLU/RSV testing.  Fact Sheet for Patients: EntrepreneurPulse.com.au  Fact Sheet for Healthcare Providers: IncredibleEmployment.be  This test is not yet approved or cleared by the [place] FDA and has been authorized for detection and/or diagnosis of SARS-CoV-2 by FDA under an Emergency Use Authorization (EUA). This EUA will remain in effect  (meaning this test can be used) for the duration of the COVID-19 declaration under Section 564(b)(1) of the Act, 21 U.S.C. section 360bbb-3(b)(1), unless the authorization is terminated or revoked.  Performed at [facility], [place] [address] [place], [place] [address]   MRSA Next Gen by PCR, Nasal     Status: None   Collection Time: 12/31/21  1:55 PM   Specimen: Nasal Mucosa; Nasal Swab  Result Value Ref Range Status   MRSA by PCR Next Gen NOT DETECTED NOT DETECTED Final    Comment: (NOTE) The GeneXpert MRSA Assay (FDA approved for NASAL specimens only), is one component of a comprehensive MRSA colonization surveillance program. It is not intended to diagnose MRSA infection nor to guide or monitor treatment for MRSA infections. Test performance is not FDA approved in patients less than 54 years old. Performed at [facility], [place] [address], [place], [place] [address]   Urine Culture     Status: Abnormal   Collection Time: 12/31/21  2:28 PM   Specimen: Urine, Catheterized  Result Value Ref Range Status   Specimen Description URINE, CATHETERIZED  Final   Special Requests   Final    NONE Performed at [facility], [place] [address] [place], [place] [address]    Culture (A)  Final    >=100,000 COLONIES/mL KLEBSIELLA PNEUMONIAE 60,000 COLONIES/mL SERRATIA MARCESCENS    Report Status 01/03/2022 FINAL  Final   Organism ID, Bacteria KLEBSIELLA PNEUMONIAE (A)  Final   Organism ID, Bacteria SERRATIA MARCESCENS (A)  Final      Susceptibility   Klebsiella pneumoniae - MIC*    AMPICILLIN >=32 RESISTANT Resistant     CEFAZOLIN <=4 SENSITIVE Sensitive     CEFEPIME <=0.12 SENSITIVE Sensitive     CEFTRIAXONE <=0.25 SENSITIVE Sensitive     CIPROFLOXACIN <=0.25 SENSITIVE Sensitive     GENTAMICIN <=1 SENSITIVE Sensitive     IMIPENEM <=0.25 SENSITIVE Sensitive     NITROFURANTOIN 128 RESISTANT Resistant     TRIMETH/SULFA <=20 SENSITIVE Sensitive     AMPICILLIN/SULBACTAM 8  SENSITIVE Sensitive     PIP/TAZO <=4 SENSITIVE Sensitive     * >=100,000 COLONIES/mL KLEBSIELLA PNEUMONIAE   Serratia marcescens - MIC*    CEFAZOLIN >=64 RESISTANT Resistant     CEFEPIME <=0.12 SENSITIVE Sensitive     CEFTRIAXONE <=0.25 SENSITIVE Sensitive     CIPROFLOXACIN <=0.25 SENSITIVE Sensitive     GENTAMICIN <=1 SENSITIVE Sensitive     NITROFURANTOIN 256 RESISTANT Resistant     TRIMETH/SULFA <=20 SENSITIVE Sensitive     * 60,000 COLONIES/mL SERRATIA MARCESCENS    Labs: CBC: Recent Labs  Lab 01/11/22 0431 01/12/22 0513 01/13/22 0338 01/14/22 0257 01/15/22 0352  WBC 9.3 8.6 10.2 8.3 5.6  HGB 10.7* 10.1* 9.7* 9.9* 9.3*  HCT 35.2* 34.1* 32.7* 32.7* 31.2*  MCV 91.0 91.7 91.3 90.8 89.7  PLT 192 187 205 215 620   Basic Metabolic Panel: Recent Labs  Lab 01/11/22 0440 01/12/22 0513 01/13/22 0338 01/14/22 0257 01/15/22 0352 01/16/22 0425 01/17/22 0508  NA 142 142 141 143 140 140 140  K 4.1 3.3* 3.2* 3.6 3.3* 3.4* 3.3*  CL 98 96* 95* 96* 91* 90* 88*  CO2 32 35* 33* 35* 37* 39* 41*  GLUCOSE 90 92 90 87 92 91 89  BUN 115* 111* 107* 100* 95* 87* 78*  CREATININE 3.94* 3.57* 3.56* 3.22* 3.05* 3.01* 2.79*  CALCIUM 8.6* 8.3* 8.5* 8.5* 8.5* 8.6* 8.7*  MG  --   --  1.5* 1.8  --  1.8  --   PHOS 4.6 4.0 3.2  --   --   --  2.3*   Liver Function Tests: Recent Labs  Lab 01/11/22 0440 01/12/22 0513 01/13/22 0338 01/17/22 0508  ALBUMIN 2.7* 2.6* 2.6* 2.8*   CBG: Recent Labs  Lab 01/17/22 1103  GLUCAP 87    Discharge time spent: greater than 30 minutes.  Signed: Tawni Millers, MD [facility] 01/17/2022

## 2022-01-17 NOTE — Progress Notes (Signed)
Inpatient Rehab Admissions Coordinator:   Note pt refusing post-acute rehab of any sort.  Doubt we could get insurance approval from [facility] for [place] admission.  Will sign off.    Shann Medal, PT, DPT Admissions Coordinator [phone] 01/17/22  11:15 AM

## 2022-01-17 NOTE — Plan of Care (Signed)
Problem: Pain Managment: Goal: General experience of comfort will improve Outcome: Completed/Met

## 2022-01-17 NOTE — TOC CM/SW Note (Signed)
HF TOC CM 2nd attempt for referral with [facility]. Left message for return call. Pt request dc home with HH, she lives alone. Will need assistance in home. HH arranged with [facility], and 3n1 scheduled delivery by [place]. [place], Heart Failure TOC CM [phone]

## 2022-01-18 DIAGNOSIS — I5043 Acute on chronic combined systolic (congestive) and diastolic (congestive) heart failure: Secondary | ICD-10-CM | POA: Diagnosis not present

## 2022-01-18 DIAGNOSIS — N170 Acute kidney failure with tubular necrosis: Secondary | ICD-10-CM | POA: Diagnosis not present

## 2022-01-18 DIAGNOSIS — I639 Cerebral infarction, unspecified: Secondary | ICD-10-CM | POA: Diagnosis not present

## 2022-01-18 DIAGNOSIS — I2510 Atherosclerotic heart disease of native coronary artery without angina pectoris: Secondary | ICD-10-CM | POA: Diagnosis not present

## 2022-01-18 LAB — RENAL FUNCTION PANEL
Albumin: 2.9 g/dL — ABNORMAL LOW (ref 3.5–5.0)
Anion gap: 11 (ref 5–15)
BUN: 70 mg/dL — ABNORMAL HIGH (ref 8–23)
CO2: 42 mmol/L — ABNORMAL HIGH (ref 22–32)
Calcium: 8.7 mg/dL — ABNORMAL LOW (ref 8.9–10.3)
Chloride: 85 mmol/L — ABNORMAL LOW (ref 98–111)
Creatinine, Ser: 2.83 mg/dL — ABNORMAL HIGH (ref 0.44–1.00)
GFR, Estimated: 18 mL/min — ABNORMAL LOW (ref >60)
Glucose, Bld: 86 mg/dL (ref 70–99)
Phosphorus: 1.7 mg/dL — ABNORMAL LOW (ref 2.5–4.6)
Potassium: 3.2 mmol/L — ABNORMAL LOW (ref 3.5–5.1)
Sodium: 138 mmol/L (ref 135–145)

## 2022-01-18 MED ORDER — POTASSIUM CHLORIDE CRYS ER 20 MEQ PO TBCR
40.0000 meq | EXTENDED_RELEASE_TABLET | Freq: Once | ORAL | Status: AC
  Administered 2022-01-18: 40 meq via ORAL
  Filled 2022-01-18: qty 2

## 2022-01-18 NOTE — TOC Transition Note (Signed)
Transition of Care [facility]) - CM/SW Discharge Note   Patient Details  Name: Patricia Wagner MRN: [id number] Date of Birth: 1951/01/25  Transition of Care [facility]) CM/SW Contact:  Zenon Mayo, RN Phone Number: 01/18/2022, 9:27 AM   Clinical Narrative:    Patient is for dc today, NCM notified [employer] , they are on the way with her oxygen to the room.  NCM notified [place] regarding dc today as well.    Final next level of care: [place] Barriers to Discharge: Continued Medical Work up   Patient Goals and CMS Choice Patient states their goals for this hospitalization and ongoing recovery are:: return home CMS Medicare.gov Compare Post Acute Care list provided to:: Patient Choice offered to / list presented to : Patient  Discharge Placement                       Discharge Plan and Services In-house Referral: Clinical Social Work Discharge Planning Services: CM Consult Post Acute Care Choice: Durable Medical Equipment, Home Health          DME Arranged: Oxygen DME Agency: [employer] Date DME Agency Contacted: 01/17/22 Time DME Agency Contacted: [id number] Representative spoke with at DME Agency: Freda Munro HH Arranged: RN, PT, OT, Nurse's Aide, Social Work [employer] Agency: [place] Date [place]: 01/15/22 Time [place]: 1700 Representative spoke with at [place]: [place] ([place]) Interventions Food Insecurity Interventions: Intervention Not Indicated Financial Strain Interventions: Intervention Not Indicated Housing Interventions: Intervention Not Indicated Transportation Interventions: Intervention Not Indicated   Readmission Risk Interventions No flowsheet data found.

## 2022-01-18 NOTE — Progress Notes (Signed)
Patient is feeling well, no dyspnea or chest pain, no events overnight.  Today she has family support to get home.  BP 135/76 (BP Location: Left Arm)    Pulse 83    Temp 98.6 F (37 C) (Oral)    Resp 17    Ht 5\' 4"  (1.626 m)    Wt 97.6 kg    SpO2 94%    BMI 36.93 kg/m   Neurology awake and alert ENT with no pallor Cardiovascular with S1 and S2 present, with no gallops or murmurs  No lower extremity edema Respiratory with no wheezing or rales, no rhonchi  Abdomen soft and non tender.   Acute heart failure decompensation with respiratory failure, complicated with renal failure and CVA.   NA 138, K 3,2, CL 85, bicarbonate 42, glucose 86 bun 70 and cr at 2,83   Plan to discharge home today follow up renal function as outpatient. Add 40 meq KCl today before her discharge.

## 2022-01-20 ENCOUNTER — Telehealth (HOSPITAL_COMMUNITY): Payer: Self-pay

## 2022-01-20 ENCOUNTER — Telehealth: Payer: Self-pay

## 2022-01-20 ENCOUNTER — Other Ambulatory Visit (HOSPITAL_COMMUNITY): Payer: Self-pay

## 2022-01-20 DIAGNOSIS — Z5982 Transportation insecurity: Secondary | ICD-10-CM

## 2022-01-20 NOTE — Telephone Encounter (Signed)
Advanced Heart Failure Patient Advocate Encounter  The patient was approved for a PAN HF grant that will help cover the cost of Entresto. Total amount awarded, $1000. Eligibility through 08/11/2022.   BIN [id number] PCN PANF ID  [id number] Group [id number]

## 2022-01-20 NOTE — Telephone Encounter (Signed)
Transition Care Management Follow-up Telephone Call Date of discharge and from where: [facility] 01-18-22 Dx:  acute CHF  How have you been since you were released from the hospital? Doing good  Any questions or concerns? No  Items Reviewed: Did the pt receive and understand the discharge instructions provided? Yes  Medications obtained and verified? Yes  Other? No  Any new allergies since your discharge? No  Dietary orders reviewed? Yes Do you have support at home? Yes   Home Care and Equipment/Supplies: Were home health services ordered? Yes- RN/PT/OT/Aide/SW If so, what is the name of the agency? [facility]   Has the agency set up a time to come to the patient's home? yes Were any new equipment or medical supplies ordered?  Yes: oxygen What is the name of the medical supply agency? Hospital  Were you able to get the supplies/equipment? yes Do you have any questions related to the use of the equipment or supplies? No  Functional Questionnaire: (I = Independent and D = Dependent) ADLs: D  Bathing/Dressing- D  Meal Prep- I  Eating- I  Maintaining continence- I  Transferring/Ambulation- D  Managing Meds- I  Follow up appointments reviewed:  PCP Hospital f/u appt confirmed? Yes  Scheduled to see Dr Ronnald Ramp on 01-27-22 @ [facility] f/u appt confirmed? Yes  Scheduled to see Cardiology on 01-28-22 @ 330pm. Are transportation arrangements needed? Yes  If their condition worsens, is the pt aware to call PCP or go to the Emergency Dept.? Yes Was the patient provided with contact information for the PCP's office or ED? Yes Was to pt encouraged to call back with questions or concerns? Yes

## 2022-01-20 NOTE — Telephone Encounter (Signed)
° °  Telephone encounter was:  Successful.  01/20/2022 Name: Patricia Wagner MRN: [id number] DOB: 09/04/1951  Patricia Wagner is a 71 y.o. year old female who is a primary care patient of Janith Lima, MD . The community resource team was consulted for assistance with Transportation Needs   Care guide performed the following interventions: Received return call from Ms. Pendelton, she has spoken to [facility] and she has a transportation benefit.  I asked if she needed assistance setting up transportation for her appointments and she stated she was able to call without assistance. Patient is aware of the dates and times of her appointments. She also has my name and contact number.  Follow Up Plan:  No further follow up planned at this time. The patient has been provided with needed resources.  Gloria Lambertson, AAS Paralegal, [place] Management  [address], [place] [address] ??[email or web address]   ?? [phone]   [email or web address]

## 2022-01-20 NOTE — Telephone Encounter (Signed)
Pharmacy Transitions of Care Follow-up Telephone Call  Date of discharge: 01/17/22  Discharge Diagnosis: Acute CHF  How have you been since you were released from the hospital? Patient doing well since discharge, no questions about meds at this time.   Medication changes made at discharge:     START taking: atorvastatin (LIPITOR)  Eliquis (apixaban)  midodrine (PROAMATINE)  torsemide (DEMADEX)  STOP taking: Entresto 24-26 MG (sacubitril-valsartan)  furosemide 80 MG tablet (LASIX)  Jardiance 10 MG Tabs tablet (empagliflozin)  leflunomide 20 MG tablet (ARAVA)  Potassium Chloride ER 20 MEQ Tbcr   Medication changes verified by the patient? Yes    Medication Accessibility:  Home Pharmacy: Not discussed   Was the patient provided with refills on discharged medications? No   Have all prescriptions been transferred from [facility] to home pharmacy? N/A   Is the patient able to afford medications? Has insurance    Medication Review:  Patient verified they received counseling on Eliquis while in the hospital  APIXABAN (ELIQUIS)  Apixaban 5 mg BID initiated on 01/17/22.  - Discussed importance of taking medication around the same time everyday  - Advised patient of medications to avoid (NSAIDs, ASA)  - Educated that Tylenol (acetaminophen) will be the preferred analgesic to prevent risk of bleeding  - Emphasized importance of monitoring for signs and symptoms of bleeding (abnormal bruising, prolonged bleeding, nose bleeds, bleeding from gums, discolored urine, black tarry stools)  - Advised patient to alert all providers of anticoagulation therapy prior to starting a new medication or having a procedure   Follow-up Appointments:  PCP Hospital f/u appt confirmed? Scheduled to see Dr. Ronnald Ramp on 01/27/22 @ 4:00pm.   If their condition worsens, is the pt aware to call PCP or go to the Emergency Dept.? Yes  Final Patient Assessment: Patient has f/u scheduled and knows to get refills at home  pharmacy

## 2022-01-20 NOTE — Telephone Encounter (Signed)
° °  Telephone encounter was:  Successful.  01/20/2022 Name: [name] MRN: [id number] DOB: 1951-04-03  Patricia Wagner is a 71 y.o. year old female who is a primary care patient of Janith Lima, MD . The community resource team was consulted for assistance with Transportation Needs   Care guide performed the following interventions: Spoke with patient to let her know I had contacted [employer] to check if she had a transportation benefit.  Unfortunately they will only speak with the member.  I asked the patient if she would like to do a three way call. She responded that she could call without assistance and would call me back.    Follow Up Plan:  Client will call me back after speaking with [employer].  Delora Gravatt, AAS Paralegal, [place] Management  [address], [place] [address] ??[email or web address]   ?? [phone]   [email or web address]

## 2022-01-24 ENCOUNTER — Other Ambulatory Visit (HOSPITAL_COMMUNITY): Payer: Self-pay | Admitting: Internal Medicine

## 2022-01-24 ENCOUNTER — Other Ambulatory Visit: Payer: Self-pay | Admitting: Internal Medicine

## 2022-01-24 DIAGNOSIS — I5022 Chronic systolic (congestive) heart failure: Secondary | ICD-10-CM

## 2022-01-24 DIAGNOSIS — Z5982 Transportation insecurity: Secondary | ICD-10-CM

## 2022-01-27 ENCOUNTER — Telehealth: Payer: Self-pay | Admitting: Internal Medicine

## 2022-01-27 ENCOUNTER — Ambulatory Visit: Payer: Medicare HMO | Admitting: Internal Medicine

## 2022-01-27 NOTE — Telephone Encounter (Signed)
Verbal orders given for PT. 

## 2022-01-27 NOTE — Telephone Encounter (Signed)
Patient called in ? ?Cancelled hosp fu appt scheduled for today 03.06.23 but would like cb from nurse.. please call [phone] ?

## 2022-01-27 NOTE — Telephone Encounter (Signed)
Home Health verbal orders-caller/Agency: [facility] ? ?Callback number: [phone] secure vm ? ?Requesting OT/PT/Skilled nursing/Social Work/Speech: PT ? ?Frequency: 1w1 2w4 1w4 ?

## 2022-01-28 ENCOUNTER — Encounter (HOSPITAL_COMMUNITY): Payer: Medicare HMO

## 2022-01-28 NOTE — Telephone Encounter (Signed)
Pt checking status of return call ? ?Pt states she has questions regarding which medications she is to continue ? ?Please call pt ?

## 2022-01-29 ENCOUNTER — Telehealth: Payer: Self-pay | Admitting: *Deleted

## 2022-01-29 NOTE — Chronic Care Management (AMB) (Signed)
?  Chronic Care Management  ? ?Note ? ?01/29/2022 ?Name: AISHIA BARKEY MRN: [id number] DOB: 01/20/1951 ? ?KYLIEANN EAGLES is a 71 y.o. year old female who is a primary care patient of Janith Lima, MD. I reached out to [name] by phone today in response to a referral sent by Ms. Jobe Marker PCP. ? ?Ms. Mudrick was given information about Chronic Care Management services today including:  ?CCM service includes personalized support from designated clinical staff supervised by her physician, including individualized plan of care and coordination with other care providers ?24/7 contact phone numbers for assistance for urgent and routine care needs. ?Service will only be billed when office clinical staff spend 20 minutes or more in a month to coordinate care. ?Only one practitioner may furnish and bill the service in a calendar month. ?The patient may stop CCM services at any time (effective at the end of the month) by phone call to the office staff. ?The patient is responsible for co-pay (up to 20% after annual deductible is met) if co-pay is required by the individual health plan.  ? ?Patient agreed to services and verbal consent obtained.  ? ?Follow up plan: ?Telephone appointment with care management team member scheduled for:02/06/22 ? ?Laverda Sorenson  ?Care Guide, Embedded Care Coordination ?[place]  Care Management  ?Direct Dial: [phone] ? ?

## 2022-01-29 NOTE — Chronic Care Management (AMB) (Signed)
?  Chronic Care Management  ? ?Outreach Note ? ?01/29/2022 ?Name: Patricia Wagner MRN: [id number] DOB: 1950/12/09 ? ?Patricia Wagner is a 71 y.o. year old female who is a primary care patient of Janith Lima, MD. I reached out to [name] by phone today in response to a referral sent by Ms. Jobe Marker primary care provider. ? ?An unsuccessful telephone outreach was attempted today. The patient was referred to the case management team for assistance with care management and care coordination.  ? ?Follow Up Plan: A HIPAA compliant phone message was left for the patient providing contact information and requesting a return call.  ?The care management team will reach out to the patient again over the next 7 days.  ?If patient returns call to provider office, please advise to call [place]* at [phone].* ? ?Laverda Sorenson  ?Care Guide, Embedded Care Coordination ?[place]  Care Management  ?Direct Dial: [phone] ? ?

## 2022-01-30 ENCOUNTER — Telehealth (HOSPITAL_COMMUNITY): Payer: Self-pay | Admitting: *Deleted

## 2022-01-30 ENCOUNTER — Other Ambulatory Visit (HOSPITAL_COMMUNITY): Payer: Self-pay | Admitting: *Deleted

## 2022-01-30 MED ORDER — TORSEMIDE 20 MG PO TABS: 80.0000 mg | ORAL_TABLET | Freq: Every day | ORAL | 3 refills | Status: AC

## 2022-01-30 NOTE — Progress Notes (Unsigned)
Office Visit Note  Patient: Patricia Wagner             Date of Birth: 1951/05/22           MRN: [id number]             PCP: Janith Lima, MD Referring: Janith Lima, MD Visit Date: 02/13/2022 Occupation: '@GUAROCC'$ @  Subjective:  No chief complaint on file.   History of Present Illness: Patricia Wagner is a 71 y.o. female ***   Activities of Daily Living:  Patient reports morning stiffness for *** {minute/hour:19697}.   Patient {ACTIONS;DENIES/REPORTS:21021675::"Denies"} nocturnal pain.  Difficulty dressing/grooming: {ACTIONS;DENIES/REPORTS:21021675::"Denies"} Difficulty climbing stairs: {ACTIONS;DENIES/REPORTS:21021675::"Denies"} Difficulty getting out of chair: {ACTIONS;DENIES/REPORTS:21021675::"Denies"} Difficulty using hands for taps, buttons, cutlery, and/or writing: {ACTIONS;DENIES/REPORTS:21021675::"Denies"}  No Rheumatology ROS completed.   PMFS History:  Patient Active Problem List   Diagnosis Date Noted   UTI (urinary tract infection) 01/09/2022   Acute CVA (cerebrovascular accident) ([place]) 01/06/2022   Slurred speech 12/31/2021   Leukocytosis 12/31/2021   LRTI (lower respiratory tract infection) 08/29/2021   Age-related osteoporosis without current pathological fracture 08/28/2021   Cough productive of purulent sputum 08/28/2021   CKD (chronic kidney disease) stage 3, GFR 30-59 ml/min (HCC)    Visit for screening mammogram 05/22/2021   Steroid dependence ([place]) 05/22/2021   Estrogen deficiency 05/22/2021   Encounter for general adult medical examination with abnormal findings 05/22/2021   Dyslipidemia, goal LDL below 130 05/22/2021   Primary osteoarthritis of right knee 03/20/2020   Persistent atrial fibrillation ([place]) 10/13/2019   Allergic rhinitis 09/10/2019   Vitamin D deficiency 04/15/2019   Acute renal failure superimposed on stage 3b chronic kidney disease ([place]) 73/41/9379   Chronic systolic (congestive) heart failure ([place]) 03/05/2018   Chronic atrial  fibrillation 03/05/2018   Urinary retention 11/23/2017   Bradycardia 11/20/2016   Acute on chronic combined systolic and diastolic CHF (congestive heart failure) ([place]) 11/20/2016   Cerebrovascular accident (CVA) due to embolism of left cerebellar artery (HCC)    PAF (paroxysmal atrial fibrillation) (HCC)    CHF (congestive heart failure) ([place]) 10/31/2016   CAD in native artery, s/p cardiac cath with non obstructive CAD 10/24/2016   NICM (nonischemic cardiomyopathy) ([place]) 10/24/2016   Tobacco abuse 10/20/2016   Degenerative arthritis of left knee 03/15/2012   Encounter for well adult exam with abnormal findings 03/12/2012   Hypersomnia    MENOPAUSAL DISORDER 01/09/2011   HLD (hyperlipidemia) 02/03/2008   Essential hypertension 07/22/2007   Raynaud's syndrome 07/22/2007    Past Medical History:  Diagnosis Date   Acute blood loss anemia 03/05/2018   Acute hypercapnic respiratory failure ([place]) 04/30/2017   Acute renal failure superimposed on chronic kidney disease ([place]) 10/20/2016   Arthritis    hands, knees   Atrial fibrillation ([place])    a. s/p multiple cardioversions; failed tikosyn/sotalol.   Bradycardia 11/20/2016   CAD in native artery, s/p cardiac cath with non obstructive CAD 10/24/2016   Cerebrovascular accident (CVA) due to embolism of left cerebellar artery (HCC)    CHF (congestive heart failure) ([place])    Chronic anticoagulation 03/15/2018   Chronic atrial fibrillation ([place]) 0/24/0973   Chronic systolic (congestive) heart failure ([place]) 03/05/2018   CKD (chronic kidney disease) stage 3, GFR 30-59 ml/min (HCC)    Coagulopathy ([place]) 03/05/2018   DIVERTICULITIS, HX OF 07/25/2007   Diverticulosis of colon with hemorrhage 07/22/2007   Qualifier: Diagnosis of  By: [name]     DIVERTICULOSIS, COLON 07/22/2007   Dysuria 07/21/2017  Edema, peripheral    a. chronic BLE edema, R>L. Prior trauma from dog attack and accident.   Essential hypertension 07/22/2007   Qualifier:  Diagnosis of  By: [name]     History of kidney stones    HLD (hyperlipidemia) 02/03/2008   Qualifier: Diagnosis of  By: [name], James W    HYPERLIPIDEMIA 02/03/2008   Hypersomnia    declines w/u   Hypertension    Hypotension (arterial) 04/30/2017   MENOPAUSAL DISORDER 01/09/2011   Morbid obesity ([place]) 07/22/2007   NICM (nonischemic cardiomyopathy) ([place]) 10/24/2016   PAF (paroxysmal atrial fibrillation) ([place])    Presence of indwelling urinary catheter    supra pubic cath   Raynaud's syndrome 07/22/2007   Stroke ([place]) 2017   Suspected sleep apnea 05/20/2017   THYROID NODULE, RIGHT 01/04/2010   VITAMIN D DEFICIENCY 01/09/2011   Qualifier: Diagnosis of  By: [name], [name]     Family History  Problem Relation Age of Onset   Asthma Mother    Diabetes Father    Heart disease Father        Died of presumed heart attack - 50s   Lung disease Sister    Heart disease Sister        Twin sister has heart issue, unclear what kind   Healthy Daughter    Thyroid disease Neg Hx    Colon polyps Neg Hx    Esophageal cancer Neg Hx    Pancreatic cancer Neg Hx    Stomach cancer Neg Hx    Past Surgical History:  Procedure Laterality Date   ATRIAL FIBRILLATION ABLATION N/A 10/13/2019   Procedure: ATRIAL FIBRILLATION ABLATION;  Surgeon: [name], MD;  Location: [facility];  Service: Cardiovascular;  Laterality: N/A;   CARDIAC CATHETERIZATION N/A 10/23/2016   Procedure: Left Heart Cath and Coronary Angiography;  Surgeon: [name], MD;  Location: [facility];  Service: Cardiovascular;  Laterality: N/A;   CARDIOVERSION N/A 10/31/2016   Procedure: CARDIOVERSION;  Surgeon: [name], MD;  Location: [facility];  Service: Cardiovascular;  Laterality: N/A;   CARDIOVERSION N/A 11/03/2016   Procedure: CARDIOVERSION;  Surgeon: [name], MD;  Location: [place];  Service: Cardiovascular;  Laterality: N/A;   CARDIOVERSION N/A 11/18/2016   Procedure:  CARDIOVERSION;  Surgeon: [name], MD;  Location: [facility] ENDOSCOPY;  Service: Cardiovascular;  Laterality: N/A;   CARDIOVERSION N/A 03/25/2017   Procedure: CARDIOVERSION;  Surgeon: [name], MD;  Location: [facility] ENDOSCOPY;  Service: Cardiovascular;  Laterality: N/A;   CARDIOVERSION N/A 05/06/2017   Procedure: CARDIOVERSION;  Surgeon: [name], MD;  Location: [facility] ENDOSCOPY;  Service: Cardiovascular;  Laterality: N/A;   CARDIOVERSION N/A 05/12/2017   Procedure: CARDIOVERSION;  Surgeon: [name], MD;  Location: [facility]OSCOPY;  Service: Cardiovascular;  Laterality: N/A;   CHOLECYSTECTOMY     COLONOSCOPY N/A 12/04/2017   Procedure: COLONOSCOPY;  Surgeon: [name], MD;  Location: [facility] ENDOSCOPY;  Service: Endoscopy;  Laterality: N/A;   COLONOSCOPY     COLONOSCOPY W/ POLYPECTOMY  02/2011   pan diverticulosis.  tubular adenoma without dysplasia on 5 mm sigmoid polyp.  Dr Fuller Plan.     ESOPHAGOGASTRODUODENOSCOPY (EGD) WITH PROPOFOL N/A 05/16/2020   Procedure: ESOPHAGOGASTRODUODENOSCOPY (EGD) WITH PROPOFOL;  Surgeon: Milus Banister, MD;  Location: [facility] ENDOSCOPY;  Service: Endoscopy;  Laterality: N/A;   FLEXIBLE SIGMOIDOSCOPY N/A 01/18/2020   Procedure: FLEXIBLE SIGMOIDOSCOPY;  Surgeon: [name], DO;  Location: [place];  Service: Gastroenterology;  Laterality: N/A;   HEMOSTASIS CLIP PLACEMENT  01/18/2020   Procedure: HEMOSTASIS CLIP PLACEMENT;  Surgeon: [name], DO;  Location: [place];  Service: Gastroenterology;;   HOT HEMOSTASIS N/A 05/16/2020   Procedure: HOT HEMOSTASIS (ARGON PLASMA COAGULATION/BICAP);  Surgeon: Milus Banister, MD;  Location: [facility];  Service: Endoscopy;  Laterality: N/A;   PARTIAL HYSTERECTOMY     1 OVARY LEFT   SCLEROTHERAPY  01/18/2020   Procedure: SCLEROTHERAPY;  Surgeon: [name], DO;  Location: [facility];  Service: Gastroenterology;;   TEE WITHOUT CARDIOVERSION N/A 10/31/2016   Procedure:  TRANSESOPHAGEAL ECHOCARDIOGRAM (TEE);  Surgeon: [name], MD;  Location: [facility] ENDOSCOPY;  Service: Cardiovascular;  Laterality: N/A;   TEE WITHOUT CARDIOVERSION N/A 03/25/2017   Procedure: TRANSESOPHAGEAL ECHOCARDIOGRAM (TEE);  Surgeon: [name], MD;  Location: [facility] ENDOSCOPY;  Service: Cardiovascular;  Laterality: N/A;   TOTAL KNEE ARTHROPLASTY Right 04/10/2020   Procedure: TOTAL KNEE ARTHROPLASTY;  Surgeon: Renette Butters, MD;  Location: [facility];  Service: Orthopedics;  Laterality: Right;   Social History   Social History Narrative   Not on file   Immunization History  Administered Date(s) Administered   Fluad Quad(high Dose 65+) 09/07/2019, 09/17/2020, 08/28/2021   Influenza Whole 01/09/2011   Influenza, High Dose Seasonal PF 08/30/2018   Influenza,inj,Quad PF,6+ Mos 10/25/2015, 10/24/2016, 07/21/2017   PFIZER Comirnaty(Gray Top)Covid-19 Tri-Sucrose Vaccine 12/17/2020, 05/14/2021   PFIZER(Purple Top)SARS-COV-2 Vaccination 04/28/2020, 05/24/2020   Pneumococcal Conjugate-13 04/15/2019   Pneumococcal Polysaccharide-23 10/24/2016, 03/18/2018   Td 02/02/2009   Tdap 04/15/2019     Objective: Vital Signs: There were no vitals taken for this visit.   Physical Exam   Musculoskeletal Exam: ***  CDAI Exam: CDAI Score: -- Patient Global: --; Provider Global: -- Swollen: --; Tender: -- Joint Exam 02/13/2022   No joint exam has been documented for this visit   There is currently no information documented on the homunculus. Go to the Rheumatology activity and complete the homunculus joint exam.  Investigation: No additional findings.  Imaging: MR ANGIO HEAD WO CONTRAST  Result Date: 01/07/2022 CLINICAL DATA:  71 year old female with widely scattered punctate infarcts in the cerebral hemispheres on recent MRI suspicious for embolic event. EXAM: MRA HEAD WITHOUT CONTRAST TECHNIQUE: Angiographic images of the Circle of Willis were acquired using MRA technique without  intravenous contrast. COMPARISON:  Brain MRI 01/05/2022. Neck MRA today reported separately. CTA head and neck 11/20/2016. FINDINGS: Anterior circulation: Antegrade flow in the ICA siphons. Bilateral siphon tortuosity and irregularity in keeping with atherosclerosis, but no significant siphon stenosis. Patent carotid termini, MCA and ACA origins. Normal anterior communicating artery. Visible ACA branches appear stable and within normal limits. MCA M1 segments and MCA bi/trifurcations are patent without stenosis. Visible bilateral MCA branches appear stable and within normal limits. Posterior circulation: Antegrade flow in the posterior fossa with chronically dominant distal right vertebral artery. Mild distal vertebral artery tortuosity with no distal vertebral stenosis. Patent vertebrobasilar junction. Tortuous basilar artery without stenosis. Patent bilateral AICA, SCA and PCA origins. Tortuous P1 segments with diminutive or absent posterior communicating arteries. Bilateral PCA branches are within normal limits. Anatomic variants: Dominant right vertebral artery. Other: Left cerebellar encephalomalacia. No intracranial mass effect or ventriculomegaly. IMPRESSION: No intracranial large vessel occlusion or hemodynamically significant stenosis. Generalized arterial tortuosity and evidence of ICA siphon atherosclerosis. Electronically Signed   By: Genevie Ann M.D.   On: 01/07/2022 06:52   MR ANGIO NECK WO CONTRAST  Result Date: 01/07/2022 CLINICAL  DATA:  71 year old female with widely scattered punctate infarcts in the cerebral hemispheres on recent MRI suspicious for embolic event. EXAM: MRA NECK WITHOUT CONTRAST TECHNIQUE: Angiographic images of the neck were acquired using MRA technique without intravenous contrast. Carotid stenosis measurements (when applicable) are obtained utilizing NASCET criteria, using the distal internal carotid diameter as the denominator. COMPARISON:  Brain MRI 01/05/2022.  CTA head and  neck 01/22/2016. FINDINGS: 2D and 3D time-of-flight neck MRA. Antegrade flow in the bilateral Common carotid and internal carotid arteries, which have a partially retropharyngeal course bilaterally. Continued antegrade flow in both ICAs to the skull base. Motion artifact at the carotid bifurcations on 2D time-of-flight. No evidence of hemodynamically significant cervical ICA stenosis on 3D time-of-flight images. Antegrade flow in dominant appearing right vertebral artery with no evidence of stenosis. Absent antegrade flow signal in the cervical left vertebral artery, while in 2017 there was no left vertebral artery enhancement until the V3 segment. And once again, the distal left vertebral artery appears to be reconstituted from muscular/ECA branches at the V3 level. Subsequently, both distal vertebral arteries demonstrate antegrade flow signal to the vertebrobasilar junction. IMPRESSION: 1. Partially retropharyngeal carotids with no evidence of stenosis in the neck. 2. Dominant right vertebral artery without stenosis. 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 Electronically Signed   By: Genevie Ann M.D.   On: 01/07/2022 07:04   MR BRAIN WO CONTRAST  Result Date: 01/05/2022 CLINICAL DATA:  Neuro deficit, acute, stroke suspected. EXAM: MRI HEAD WITHOUT CONTRAST TECHNIQUE: Multiplanar, multiecho pulse sequences of the brain and surrounding structures were obtained without intravenous contrast. COMPARISON:  Head CT 12/30/2021 and MRI 08/06/2018 FINDINGS: Brain: Scattered punctate acute cortical and subcortical infarcts are present in the frontal lobes, parietal lobes, and superior right occipital lobe. There is also a single punctate acute infarct in the right cerebellar hemisphere. A chronic superior left cerebellar infarct is again noted with associated chronic blood products. There is also a small chronic infarct in the  superomedial aspect of the right occipital lobe. Scattered small T2 hyperintensities in the cerebral white matter bilaterally have mildly progressed from the prior MRI and are nonspecific but compatible with mild chronic small vessel ischemic disease. The ventricles are normal in size. No mass, midline shift, or extra-axial fluid collection is identified. A partially empty sella is unchanged from the prior MRI. Vascular: Major intracranial vascular flow voids are preserved. Skull and upper cervical spine: Unremarkable bone marrow signal. Sinuses/Orbits: Unremarkable orbits. Small mucous retention cyst in a left ethmoid air cell. Trace left mastoid fluid. Other: Unchanged small Tornwaldt cyst. IMPRESSION: 1. Scattered punctate acute bilateral cerebral and right cerebellar infarcts suggesting a central embolic source. 2. Mild chronic small vessel ischemic disease with chronic infarcts as above. Electronically Signed   By: Logan Bores M.D.   On: 01/05/2022 18:34   DG CHEST PORT 1 VIEW  Result Date: 01/02/2022 CLINICAL DATA:  Acute hemodialysis EXAM: PORTABLE CHEST 1 VIEW COMPARISON:  12/30/2021 FINDINGS: Cardiomegaly. No frank interstitial edema. No pleural effusion or pneumothorax. Right IJ dual lumen dialysis catheter terminates in the lower SVC. Degenerative changes of the thoracic spine. IMPRESSION: Cardiomegaly.  No frank interstitial edema. Electronically Signed   By: Julian Hy M.D.   On: 01/02/2022 19:11   ECHOCARDIOGRAM COMPLETE  Result Date: 12/31/2021    ECHOCARDIOGRAM REPORT   Patient Name:   Patricia Wagner Date of Exam: 12/31/2021 Medical Rec #:  [id number]  Height:       64.0 in Accession #:    [id number]   Weight:       218.0 lb Date of Birth:  1950/12/05    BSA:          2.029 m Patient Age:    44 years     BP:           99/62 mmHg Patient Gender: F            HR:           70 bpm. Exam Location:  Inpatient Procedure: 2D Echo Indications:    Acute systolic CHF  History:        Patient has  prior history of Echocardiogram examinations, most                 recent 08/12/2021. CHF, CAD, Arrythmias:Atrial Fibrillation and                 Bradycardia; Risk Factors:Hypertension.  Sonographer:    Arlyss Gandy Referring Phys: [id number] RATHORE  Sonographer Comments: Image acquisition challenging due to patient body habitus and supine. IMPRESSIONS  1. Left ventricular ejection fraction, by estimation, is 45 to 50%. The left ventricle has mildly decreased function. The left ventricle demonstrates global hypokinesis. Left ventricular diastolic function could not be evaluated.  2. Right ventricular systolic function is moderately reduced. The right ventricular size is severely enlarged. There is moderately elevated pulmonary artery systolic pressure.  3. Left atrial size was mildly dilated.  4. Right atrial size was severely dilated.  5. The mitral valve is normal in structure. No evidence of mitral valve regurgitation.  6. Tricuspid valve regurgitation is moderate to severe.  7. The aortic valve is tricuspid. There is mild calcification of the aortic valve. There is mild thickening of the aortic valve. Aortic valve regurgitation is mild. Aortic valve sclerosis/calcification is present, without any evidence of aortic stenosis. Comparison(s): A prior study was performed on 08/12/2021. The left ventricular function has improved. The left ventricle is also less dilated. The improvement is noted despite the fact that the previous study showed sinus rhythm and now the patient is in  atrial fibrillation. Conclusion(s)/Recommendation(s): The background rhythm is atrial fibrillation with occasional ventricular-paced beats. During ventricular pacing there is marked dyssynchrony, lower LVEF and the tricuspid insufficiency appears worse. FINDINGS  Left Ventricle: Left ventricular ejection fraction, by estimation, is 45 to 50%. The left ventricle has mildly decreased function. The left ventricle demonstrates global  hypokinesis. The left ventricular internal cavity size was normal in size. There is  no left ventricular hypertrophy. Left ventricular diastolic function could not be evaluated due to atrial fibrillation. Left ventricular diastolic function could not be evaluated. Right Ventricle: The right ventricular size is severely enlarged. No increase in right ventricular wall thickness. Right ventricular systolic function is moderately reduced. There is moderately elevated pulmonary artery systolic pressure. The tricuspid regurgitant velocity is 3.23 m/s, and with an assumed right atrial pressure of 15 mmHg, the estimated right ventricular systolic pressure is 62.8 mmHg. Left Atrium: Left atrial size was mildly dilated. Right Atrium: Right atrial size was severely dilated. Pericardium: There is no evidence of pericardial effusion. Mitral Valve: The mitral valve is normal in structure. Mild mitral annular calcification. No evidence of mitral valve regurgitation. Tricuspid Valve: The tricuspid valve is normal in structure. Tricuspid valve regurgitation is moderate to severe. Aortic Valve: The aortic valve is tricuspid. There is mild calcification of the aortic valve. There  is mild thickening of the aortic valve. Aortic valve regurgitation is mild. Aortic valve sclerosis/calcification is present, without any evidence of aortic stenosis. Aortic valve mean gradient measures 10.5 mmHg. Aortic valve peak gradient measures 18.4 mmHg. Aortic valve area, by VTI measures 1.44 cm. Pulmonic Valve: The pulmonic valve was grossly normal. Pulmonic valve regurgitation is not visualized. Aorta: The aortic root is normal in size and structure. IAS/Shunts: No atrial level shunt detected by color flow Doppler.  LEFT VENTRICLE PLAX 2D LVIDd:         5.30 cm   Diastology LVIDs:         3.80 cm   LV e' lateral:   8.05 cm/s LV PW:         1.10 cm   LV E/e' lateral: 10.5 LV IVS:        1.10 cm LVOT diam:     2.20 cm LV SV:         50 LV SV Index:    25 LVOT Area:     3.80 cm  RIGHT VENTRICLE            IVC RV Basal diam:  5.50 cm    IVC diam: 2.70 cm RV Mid diam:    4.90 cm RV S prime:     9.90 cm/s TAPSE (M-mode): 1.5 cm LEFT ATRIUM             Index        RIGHT ATRIUM           Index LA diam:        4.30 cm 2.12 cm/m   RA Area:     40.70 cm LA Vol (A2C):   63.6 ml 31.34 ml/m  RA Volume:   167.00 ml 82.29 ml/m LA Vol (A4C):   65.9 ml 32.47 ml/m LA Biplane Vol: 65.0 ml 32.03 ml/m  AORTIC VALVE AV Area (Vmax):    1.64 cm AV Area (Vmean):   1.44 cm AV Area (VTI):     1.44 cm AV Vmax:           214.50 cm/s AV Vmean:          152.000 cm/s AV VTI:            0.346 m AV Peak Grad:      18.4 mmHg AV Mean Grad:      10.5 mmHg LVOT Vmax:         92.70 cm/s LVOT Vmean:        57.600 cm/s LVOT VTI:          0.131 m LVOT/AV VTI ratio: 0.38  AORTA Ao Root diam: 3.00 cm MITRAL VALVE               TRICUSPID VALVE MV Area (PHT): 2.66 cm    TR Peak grad:   41.7 mmHg MV Decel Time: 285 msec    TR Vmax:        323.00 cm/s MV E velocity: 84.80 cm/s                            SHUNTS                            Systemic VTI:  0.13 m  Systemic Diam: 2.20 cm Sanda Klein MD Electronically signed by Sanda Klein MD Signature Date/Time: 12/31/2021/3:32:07 PM    Final    VAS US CAROTID  Result Date: 01/07/2022 Carotid Arterial Duplex Study Patient Name:  Patricia Wagner  Date of Exam:   01/06/2022 Medical Rec #: [id number]     Accession #:    [id number] Date of Birth: 02-25-1951     Patient Gender: F Patient Age:   11 years Exam Location:  [facility] Procedure:      VAS US CAROTID Referring Phys: Karie Kirks --------------------------------------------------------------------------------  Indications:       Carotid artery disease. Risk Factors:      Hypertension, hyperlipidemia, past history of smoking,                    coronary artery disease. Limitations        Today's exam was limited due to Line in right neck with                     bandaging. Comparison Study:  No prior study Performing Technologist: Maudry Mayhew MHA, RDMS, RVT, RDCS  Examination Guidelines: A complete evaluation includes B-mode imaging, spectral Doppler, color Doppler, and power Doppler as needed of all accessible portions of each vessel. Bilateral testing is considered an integral part of a complete examination. Limited examinations for reoccurring indications may be performed as noted.  Right Carotid Findings: +----------+--------+--------+--------+------------------+--------+             PSV cm/s EDV cm/s Stenosis Plaque Description Comments  +----------+--------+--------+--------+------------------+--------+  CCA Prox   82       17                                             +----------+--------+--------+--------+------------------+--------+  CCA Distal 67       14                                             +----------+--------+--------+--------+------------------+--------+  ICA Prox   68       19                                             +----------+--------+--------+--------+------------------+--------+  ICA Distal 22       8                                              +----------+--------+--------+--------+------------------+--------+  ECA        88       14                                             +----------+--------+--------+--------+------------------+--------+ +----------+--------+-------+----------------+-------------------+             PSV cm/s EDV cms Describe         Arm Pressure (mmHG)  +----------+--------+-------+----------------+-------------------+  Subclavian  Multiphasic, WNL                      +----------+--------+-------+----------------+-------------------+ +---------+--------+--+--------+--+---------+  Vertebral PSV cm/s 67 EDV cm/s 18 Antegrade  +---------+--------+--+--------+--+---------+  Left Carotid Findings: +----------+--------+--------+--------+--------------------------+--------+             PSV cm/s EDV  cm/s Stenosis Plaque Description         Comments  +----------+--------+--------+--------+--------------------------+--------+  CCA Prox   101      29                                                     +----------+--------+--------+--------+--------------------------+--------+  CCA Distal 97       20                                                     +----------+--------+--------+--------+--------------------------+--------+  ICA Prox   89       23                heterogenous and irregular           +----------+--------+--------+--------+--------------------------+--------+  ICA Distal 100      20                                                     +----------+--------+--------+--------+--------------------------+--------+  ECA        95       9                                                      +----------+--------+--------+--------+--------------------------+--------+ +----------+--------+--------+----------------+-------------------+             PSV cm/s EDV cm/s Describe         Arm Pressure (mmHG)  +----------+--------+--------+----------------+-------------------+  Subclavian 127               Multiphasic, WNL                      +----------+--------+--------+----------------+-------------------+ +---------+--------+--+--------+-+---------+  Vertebral PSV cm/s 22 EDV cm/s 7 Antegrade  +---------+--------+--+--------+-+---------+   Summary: Right Carotid: The extracranial vessels were near-normal with only minimal wall                thickening or plaque. Left Carotid: The extracranial vessels were near-normal with only minimal wall               thickening or plaque. Vertebrals:  Bilateral vertebral arteries demonstrate antegrade flow. Subclavians: Right subclavian artery was not visualized. Normal flow              hemodynamics were seen in the left subclavian artery. *See table(s) above for measurements and observations.  Electronically signed by Deitra Mayo MD on 01/07/2022 at 7:52:35 AM.     Final     Recent Labs: Lab Results  Component Value Date   WBC 5.6 01/15/2022  HGB 9.3 (L) 01/15/2022   PLT 199 01/15/2022   NA 138 01/18/2022   K 3.2 (L) 01/18/2022   CL 85 (L) 01/18/2022   CO2 42 (H) 01/18/2022   GLUCOSE 86 01/18/2022   BUN 70 (H) 01/18/2022   CREATININE 2.83 (H) 01/18/2022   BILITOT 1.3 (H) 12/30/2021   ALKPHOS 151 (H) 12/30/2021   AST 41 12/30/2021   ALT 28 12/30/2021   PROT 6.0 (L) 12/30/2021   ALBUMIN 2.9 (L) 01/18/2022   CALCIUM 8.7 (L) 01/18/2022   GFRAA 46 (L) 02/01/2021    Speciality Comments: No specialty comments available.  Procedures:  No procedures performed Allergies: Ace inhibitors   Assessment / Plan:     Visit Diagnoses: No diagnosis found.  Orders: No orders of the defined types were placed in this encounter.  No orders of the defined types were placed in this encounter.   Face-to-face time spent with patient was *** minutes. Greater than 50% of time was spent in counseling and coordination of care.  Follow-Up Instructions: No follow-ups on file.   Earnestine Mealing, CMA  Note - This record has been created using Editor, commissioning.  Chart creation errors have been sought, but may not always  have been located. Such creation errors do not reflect on  the standard of medical care.

## 2022-01-30 NOTE — Telephone Encounter (Signed)
[place] Nurse w/ [facility] inquiring if provider is going to put pt back on potassium due to pt being on torsemide ? ?Please call pt or nurse w/ provider's response ? ?Phone [phone] ?

## 2022-01-30 NOTE — Telephone Encounter (Signed)
Pt left VM stating potassium was stopped at hospital discharge but her K is only 3.34 she wants to know if she needs to restart it since she is taking torsemide. ? ?Routed to [place] for advice ?

## 2022-01-31 ENCOUNTER — Other Ambulatory Visit: Payer: Self-pay | Admitting: Internal Medicine

## 2022-01-31 DIAGNOSIS — T502X5A Adverse effect of carbonic-anhydrase inhibitors, benzothiadiazides and other diuretics, initial encounter: Secondary | ICD-10-CM

## 2022-01-31 DIAGNOSIS — E876 Hypokalemia: Secondary | ICD-10-CM | POA: Insufficient documentation

## 2022-01-31 MED ORDER — POTASSIUM CHLORIDE CRYS ER 15 MEQ PO TBCR: 15.0000 meq | EXTENDED_RELEASE_TABLET | Freq: Two times a day (BID) | ORAL | 0 refills | Status: AC

## 2022-01-31 NOTE — Telephone Encounter (Signed)
Called [facility] stating that rx was sent. ? ?

## 2022-01-31 NOTE — Telephone Encounter (Signed)
Left detailed vm for pt.

## 2022-02-06 ENCOUNTER — Encounter: Payer: Self-pay | Admitting: *Deleted

## 2022-02-06 ENCOUNTER — Telehealth: Payer: Self-pay | Admitting: *Deleted

## 2022-02-06 ENCOUNTER — Telehealth: Payer: Medicare HMO

## 2022-02-06 NOTE — Telephone Encounter (Signed)
?  Chronic Care Management  ? ?Follow Up Note ? ? ?02/06/2022 ?Name: Patricia Wagner MRN: [id number] DOB: [birth year] ? ?Referred by: Janith Lima, MD ?Reason for referral : Chronic Care Management (CCM RN CM Initial Outreach, Unsuccessful Outreach attempt) ? ?An unsuccessful telephone outreach was attempted today. The patient was referred to the case management team for assistance with care management and care coordination.  ? ?Follow Up Plan:  ?A HIPPA compliant phone message was left for the patient providing contact information and requesting a return call ?Will place request with scheduling care guide to contact patient to re-schedule today's missed CCM RN initial telephone appointment if I do not hear back from patient by end of day ? ?Oneta Rack, RN, BSN, CCRN Alumnus ?[place] ?[phone]: direct office ? ? ?

## 2022-02-07 ENCOUNTER — Telehealth: Payer: Self-pay | Admitting: Internal Medicine

## 2022-02-07 NOTE — Telephone Encounter (Signed)
Called to report that pt was found deceased today approximately around 1:15pm.  ? ?States [facility] aide went out today, knocked, and called pt- which was unusual. Then entered into home and saw pt on floor.  ? ?No signs of failure or distress at prior visits.  ?

## 2022-02-11 ENCOUNTER — Ambulatory Visit: Payer: Medicare HMO | Admitting: Internal Medicine

## 2022-02-11 NOTE — Telephone Encounter (Signed)
Patricia Wagner home is calling to see if Dr. Ronnald Ramp is willing to sign the death certification ? ?Dr. Ronnald Ramp replied yes he would per Nurse. ?

## 2022-02-13 ENCOUNTER — Ambulatory Visit: Payer: Medicare HMO | Admitting: Rheumatology

## 2022-02-13 ENCOUNTER — Encounter (HOSPITAL_COMMUNITY): Payer: Medicare HMO

## 2022-02-13 DIAGNOSIS — Z79899 Other long term (current) drug therapy: Secondary | ICD-10-CM

## 2022-02-13 DIAGNOSIS — I2510 Atherosclerotic heart disease of native coronary artery without angina pectoris: Secondary | ICD-10-CM

## 2022-02-13 DIAGNOSIS — I10 Essential (primary) hypertension: Secondary | ICD-10-CM

## 2022-02-13 DIAGNOSIS — R768 Other specified abnormal immunological findings in serum: Secondary | ICD-10-CM

## 2022-02-13 DIAGNOSIS — M0630 Rheumatoid nodule, unspecified site: Secondary | ICD-10-CM

## 2022-02-13 DIAGNOSIS — I482 Chronic atrial fibrillation, unspecified: Secondary | ICD-10-CM

## 2022-02-13 DIAGNOSIS — I63442 Cerebral infarction due to embolism of left cerebellar artery: Secondary | ICD-10-CM

## 2022-02-13 DIAGNOSIS — I7300 Raynaud's syndrome without gangrene: Secondary | ICD-10-CM

## 2022-02-13 DIAGNOSIS — I428 Other cardiomyopathies: Secondary | ICD-10-CM

## 2022-02-13 DIAGNOSIS — Z96651 Presence of right artificial knee joint: Secondary | ICD-10-CM

## 2022-02-13 DIAGNOSIS — I5022 Chronic systolic (congestive) heart failure: Secondary | ICD-10-CM

## 2022-02-13 DIAGNOSIS — E559 Vitamin D deficiency, unspecified: Secondary | ICD-10-CM

## 2022-02-13 DIAGNOSIS — K5731 Diverticulosis of large intestine without perforation or abscess with bleeding: Secondary | ICD-10-CM

## 2022-02-13 DIAGNOSIS — M0579 Rheumatoid arthritis with rheumatoid factor of multiple sites without organ or systems involvement: Secondary | ICD-10-CM

## 2022-02-13 DIAGNOSIS — M1712 Unilateral primary osteoarthritis, left knee: Secondary | ICD-10-CM

## 2022-02-13 DIAGNOSIS — E790 Hyperuricemia without signs of inflammatory arthritis and tophaceous disease: Secondary | ICD-10-CM

## 2022-02-13 DIAGNOSIS — R7881 Bacteremia: Secondary | ICD-10-CM

## 2022-02-13 DIAGNOSIS — N1832 Chronic kidney disease, stage 3b: Secondary | ICD-10-CM

## 2022-02-13 DIAGNOSIS — Z7901 Long term (current) use of anticoagulants: Secondary | ICD-10-CM

## 2022-02-21 ENCOUNTER — Other Ambulatory Visit (HOSPITAL_COMMUNITY): Payer: Self-pay | Admitting: Internal Medicine

## 2022-02-22 DIAGNOSIS — 419620001 Death: Secondary | SNOMED CT | POA: Diagnosis not present

## 2022-02-22 DEATH — deceased

## 2022-03-31 NOTE — Telephone Encounter (Signed)
NA

## 2022-07-01 ENCOUNTER — Other Ambulatory Visit (HOSPITAL_COMMUNITY): Payer: Self-pay
